# Patient Record
Sex: Male | Born: 1960
Health system: Southern US, Community
[De-identification: ages and names within clinical notes are randomized; demographics above are authoritative.]

## PROBLEM LIST (undated history)

## (undated) ENCOUNTER — Emergency Department (HOSPITAL_COMMUNITY): Admission: EM | Payer: Medicare Other | Source: Home / Self Care

## (undated) ENCOUNTER — Emergency Department (HOSPITAL_BASED_OUTPATIENT_CLINIC_OR_DEPARTMENT_OTHER): Payer: Medicare Other | Source: Home / Self Care

## (undated) DIAGNOSIS — R0602 Shortness of breath: Secondary | ICD-10-CM

## (undated) DIAGNOSIS — Z720 Tobacco use: Secondary | ICD-10-CM

## (undated) DIAGNOSIS — F419 Anxiety disorder, unspecified: Secondary | ICD-10-CM

## (undated) DIAGNOSIS — R413 Other amnesia: Secondary | ICD-10-CM

## (undated) DIAGNOSIS — I480 Paroxysmal atrial fibrillation: Secondary | ICD-10-CM

## (undated) DIAGNOSIS — I10 Essential (primary) hypertension: Secondary | ICD-10-CM

## (undated) DIAGNOSIS — I639 Cerebral infarction, unspecified: Secondary | ICD-10-CM

## (undated) DIAGNOSIS — I509 Heart failure, unspecified: Secondary | ICD-10-CM

## (undated) DIAGNOSIS — I69359 Hemiplegia and hemiparesis following cerebral infarction affecting unspecified side: Secondary | ICD-10-CM

## (undated) DIAGNOSIS — J449 Chronic obstructive pulmonary disease, unspecified: Secondary | ICD-10-CM

## (undated) DIAGNOSIS — I739 Peripheral vascular disease, unspecified: Secondary | ICD-10-CM

## (undated) DIAGNOSIS — T4145XA Adverse effect of unspecified anesthetic, initial encounter: Secondary | ICD-10-CM

## (undated) DIAGNOSIS — I5020 Unspecified systolic (congestive) heart failure: Secondary | ICD-10-CM

## (undated) DIAGNOSIS — E119 Type 2 diabetes mellitus without complications: Secondary | ICD-10-CM

## (undated) DIAGNOSIS — D649 Anemia, unspecified: Secondary | ICD-10-CM

## (undated) DIAGNOSIS — T829XXA Unspecified complication of cardiac and vascular prosthetic device, implant and graft, initial encounter: Secondary | ICD-10-CM

## (undated) DIAGNOSIS — I519 Heart disease, unspecified: Secondary | ICD-10-CM

## (undated) DIAGNOSIS — N289 Disorder of kidney and ureter, unspecified: Secondary | ICD-10-CM

## (undated) DIAGNOSIS — I8500 Esophageal varices without bleeding: Secondary | ICD-10-CM

## (undated) DIAGNOSIS — I340 Nonrheumatic mitral (valve) insufficiency: Secondary | ICD-10-CM

## (undated) DIAGNOSIS — J45909 Unspecified asthma, uncomplicated: Secondary | ICD-10-CM

## (undated) HISTORY — DX: Tobacco use: Z72.0

## (undated) HISTORY — DX: Nonrheumatic mitral (valve) insufficiency: I34.0

## (undated) HISTORY — DX: Heart disease, unspecified: I51.9

## (undated) HISTORY — PX: KIDNEY TRANSPLANT: SHX239

## (undated) HISTORY — PX: COLONOSCOPY W/ BIOPSIES AND POLYPECTOMY: SHX1376

## (undated) HISTORY — PX: CARDIAC CATHETERIZATION: SHX172

## (undated) HISTORY — PX: AV FISTULA PLACEMENT: SHX1204

## (undated) HISTORY — DX: Unspecified systolic (congestive) heart failure: I50.20

---
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
## 1997-08-28 ENCOUNTER — Emergency Department (HOSPITAL_COMMUNITY): Admission: EM | Admit: 1997-08-28 | Discharge: 1997-08-28 | Payer: Self-pay | Admitting: Emergency Medicine

## 1998-07-14 ENCOUNTER — Inpatient Hospital Stay (HOSPITAL_COMMUNITY): Admission: EM | Admit: 1998-07-14 | Discharge: 1998-07-15 | Payer: Self-pay

## 1998-07-14 ENCOUNTER — Encounter: Payer: Self-pay | Admitting: Internal Medicine

## 1998-07-19 ENCOUNTER — Ambulatory Visit (HOSPITAL_COMMUNITY): Admission: RE | Admit: 1998-07-19 | Discharge: 1998-07-19 | Payer: Self-pay | Admitting: *Deleted

## 1998-07-19 ENCOUNTER — Encounter: Payer: Self-pay | Admitting: *Deleted

## 1998-08-18 ENCOUNTER — Emergency Department (HOSPITAL_COMMUNITY): Admission: EM | Admit: 1998-08-18 | Discharge: 1998-08-18 | Payer: Self-pay

## 1999-09-22 ENCOUNTER — Encounter: Payer: Self-pay | Admitting: Emergency Medicine

## 1999-09-22 ENCOUNTER — Inpatient Hospital Stay (HOSPITAL_COMMUNITY): Admission: EM | Admit: 1999-09-22 | Discharge: 1999-09-24 | Payer: Self-pay | Admitting: Emergency Medicine

## 1999-09-23 ENCOUNTER — Encounter: Payer: Self-pay | Admitting: Internal Medicine

## 2000-03-03 ENCOUNTER — Emergency Department (HOSPITAL_COMMUNITY): Admission: EM | Admit: 2000-03-03 | Discharge: 2000-03-03 | Payer: Self-pay | Admitting: Emergency Medicine

## 2000-03-04 ENCOUNTER — Encounter: Payer: Self-pay | Admitting: Emergency Medicine

## 2000-05-20 ENCOUNTER — Encounter: Payer: Self-pay | Admitting: Emergency Medicine

## 2000-05-21 ENCOUNTER — Encounter: Payer: Self-pay | Admitting: Emergency Medicine

## 2000-05-21 ENCOUNTER — Inpatient Hospital Stay (HOSPITAL_COMMUNITY): Admission: EM | Admit: 2000-05-21 | Discharge: 2000-05-23 | Payer: Self-pay | Admitting: Emergency Medicine

## 2001-02-10 ENCOUNTER — Encounter (INDEPENDENT_AMBULATORY_CARE_PROVIDER_SITE_OTHER): Payer: Self-pay | Admitting: Specialist

## 2001-02-10 ENCOUNTER — Encounter: Payer: Self-pay | Admitting: Internal Medicine

## 2001-02-10 ENCOUNTER — Encounter: Payer: Self-pay | Admitting: Emergency Medicine

## 2001-02-10 ENCOUNTER — Inpatient Hospital Stay (HOSPITAL_COMMUNITY): Admission: EM | Admit: 2001-02-10 | Discharge: 2001-02-13 | Payer: Self-pay | Admitting: Emergency Medicine

## 2001-02-11 ENCOUNTER — Encounter: Payer: Self-pay | Admitting: Internal Medicine

## 2001-04-05 ENCOUNTER — Emergency Department (HOSPITAL_COMMUNITY): Admission: EM | Admit: 2001-04-05 | Discharge: 2001-04-05 | Payer: Self-pay | Admitting: Emergency Medicine

## 2001-04-11 ENCOUNTER — Encounter (HOSPITAL_COMMUNITY): Admission: RE | Admit: 2001-04-11 | Discharge: 2001-07-10 | Payer: Self-pay | Admitting: Nephrology

## 2001-06-07 ENCOUNTER — Ambulatory Visit (HOSPITAL_COMMUNITY): Admission: RE | Admit: 2001-06-07 | Discharge: 2001-06-07 | Payer: Self-pay | Admitting: Nephrology

## 2001-06-07 ENCOUNTER — Encounter: Payer: Self-pay | Admitting: Nephrology

## 2001-11-04 ENCOUNTER — Emergency Department (HOSPITAL_COMMUNITY): Admission: EM | Admit: 2001-11-04 | Discharge: 2001-11-04 | Payer: Self-pay | Admitting: Emergency Medicine

## 2001-11-05 ENCOUNTER — Emergency Department (HOSPITAL_COMMUNITY): Admission: EM | Admit: 2001-11-05 | Discharge: 2001-11-06 | Payer: Self-pay | Admitting: Emergency Medicine

## 2002-02-17 ENCOUNTER — Encounter (HOSPITAL_COMMUNITY): Admission: RE | Admit: 2002-02-17 | Discharge: 2002-05-18 | Payer: Self-pay | Admitting: Nephrology

## 2002-05-19 ENCOUNTER — Encounter (HOSPITAL_COMMUNITY): Admission: RE | Admit: 2002-05-19 | Discharge: 2002-08-17 | Payer: Self-pay | Admitting: Nephrology

## 2002-06-13 ENCOUNTER — Encounter: Payer: Self-pay | Admitting: *Deleted

## 2002-06-13 ENCOUNTER — Inpatient Hospital Stay (HOSPITAL_COMMUNITY): Admission: AD | Admit: 2002-06-13 | Discharge: 2002-06-19 | Payer: Self-pay | Admitting: Neurology

## 2002-06-13 ENCOUNTER — Emergency Department (HOSPITAL_COMMUNITY): Admission: EM | Admit: 2002-06-13 | Discharge: 2002-06-13 | Payer: Self-pay | Admitting: *Deleted

## 2002-06-14 ENCOUNTER — Encounter: Payer: Self-pay | Admitting: Neurology

## 2002-06-15 ENCOUNTER — Encounter (INDEPENDENT_AMBULATORY_CARE_PROVIDER_SITE_OTHER): Payer: Self-pay | Admitting: Interventional Cardiology

## 2002-06-19 ENCOUNTER — Inpatient Hospital Stay (HOSPITAL_COMMUNITY)
Admission: RE | Admit: 2002-06-19 | Discharge: 2002-07-03 | Payer: Self-pay | Admitting: Physical Medicine & Rehabilitation

## 2002-07-05 ENCOUNTER — Emergency Department (HOSPITAL_COMMUNITY): Admission: EM | Admit: 2002-07-05 | Discharge: 2002-07-05 | Payer: Self-pay | Admitting: Emergency Medicine

## 2002-08-04 ENCOUNTER — Encounter
Admission: RE | Admit: 2002-08-04 | Discharge: 2002-11-02 | Payer: Self-pay | Admitting: Physical Medicine & Rehabilitation

## 2002-12-17 ENCOUNTER — Encounter (HOSPITAL_COMMUNITY): Admission: RE | Admit: 2002-12-17 | Discharge: 2003-03-17 | Payer: Self-pay | Admitting: Nephrology

## 2003-01-22 ENCOUNTER — Encounter: Admission: RE | Admit: 2003-01-22 | Discharge: 2003-01-22 | Payer: Self-pay | Admitting: Nephrology

## 2003-03-31 DIAGNOSIS — Z91199 Patient's noncompliance with other medical treatment and regimen due to unspecified reason: Secondary | ICD-10-CM | POA: Insufficient documentation

## 2003-03-31 DIAGNOSIS — D509 Iron deficiency anemia, unspecified: Secondary | ICD-10-CM | POA: Insufficient documentation

## 2003-03-31 DIAGNOSIS — N2581 Secondary hyperparathyroidism of renal origin: Secondary | ICD-10-CM | POA: Insufficient documentation

## 2003-03-31 DIAGNOSIS — Z87891 Personal history of nicotine dependence: Secondary | ICD-10-CM | POA: Insufficient documentation

## 2003-05-24 ENCOUNTER — Ambulatory Visit (HOSPITAL_COMMUNITY): Admission: RE | Admit: 2003-05-24 | Discharge: 2003-05-24 | Payer: Self-pay | Admitting: Nephrology

## 2004-02-07 ENCOUNTER — Ambulatory Visit: Payer: Self-pay | Admitting: Internal Medicine

## 2004-02-14 ENCOUNTER — Ambulatory Visit: Payer: Self-pay

## 2004-03-06 ENCOUNTER — Ambulatory Visit: Payer: Self-pay

## 2004-03-20 ENCOUNTER — Encounter: Admission: RE | Admit: 2004-03-20 | Discharge: 2004-06-18 | Payer: Self-pay | Admitting: Nephrology

## 2004-06-19 ENCOUNTER — Encounter: Admission: RE | Admit: 2004-06-19 | Discharge: 2004-09-17 | Payer: Self-pay | Admitting: Nephrology

## 2004-09-18 ENCOUNTER — Encounter: Admission: RE | Admit: 2004-09-18 | Discharge: 2004-12-17 | Payer: Self-pay | Admitting: Nephrology

## 2005-03-11 ENCOUNTER — Observation Stay (HOSPITAL_COMMUNITY): Admission: EM | Admit: 2005-03-11 | Discharge: 2005-03-11 | Payer: Self-pay | Admitting: Emergency Medicine

## 2005-04-29 ENCOUNTER — Emergency Department (HOSPITAL_COMMUNITY): Admission: EM | Admit: 2005-04-29 | Discharge: 2005-04-30 | Payer: Self-pay | Admitting: Emergency Medicine

## 2005-07-02 ENCOUNTER — Emergency Department (HOSPITAL_COMMUNITY): Admission: EM | Admit: 2005-07-02 | Discharge: 2005-07-02 | Payer: Self-pay | Admitting: Emergency Medicine

## 2005-12-04 ENCOUNTER — Emergency Department (HOSPITAL_COMMUNITY): Admission: EM | Admit: 2005-12-04 | Discharge: 2005-12-04 | Payer: Self-pay | Admitting: Emergency Medicine

## 2006-03-21 ENCOUNTER — Emergency Department (HOSPITAL_COMMUNITY): Admission: EM | Admit: 2006-03-21 | Discharge: 2006-03-22 | Payer: Self-pay | Admitting: Emergency Medicine

## 2006-04-13 ENCOUNTER — Emergency Department (HOSPITAL_COMMUNITY): Admission: EM | Admit: 2006-04-13 | Discharge: 2006-04-13 | Payer: Self-pay | Admitting: Emergency Medicine

## 2007-01-13 ENCOUNTER — Ambulatory Visit: Payer: Self-pay | Admitting: Surgery

## 2007-01-22 ENCOUNTER — Ambulatory Visit: Payer: Self-pay | Admitting: Surgery

## 2007-01-22 ENCOUNTER — Ambulatory Visit (HOSPITAL_COMMUNITY): Admission: RE | Admit: 2007-01-22 | Discharge: 2007-01-22 | Payer: Self-pay | Admitting: Surgery

## 2008-02-02 ENCOUNTER — Emergency Department (HOSPITAL_COMMUNITY): Admission: EM | Admit: 2008-02-02 | Discharge: 2008-02-03 | Payer: Self-pay | Admitting: Emergency Medicine

## 2008-08-06 ENCOUNTER — Encounter: Payer: Self-pay | Admitting: Gastroenterology

## 2008-09-25 ENCOUNTER — Emergency Department (HOSPITAL_COMMUNITY): Admission: EM | Admit: 2008-09-25 | Discharge: 2008-09-26 | Payer: Self-pay | Admitting: Emergency Medicine

## 2008-10-04 ENCOUNTER — Ambulatory Visit: Payer: Self-pay | Admitting: Gastroenterology

## 2008-10-04 DIAGNOSIS — I8500 Esophageal varices without bleeding: Secondary | ICD-10-CM | POA: Insufficient documentation

## 2008-10-04 DIAGNOSIS — N186 End stage renal disease: Secondary | ICD-10-CM | POA: Insufficient documentation

## 2008-10-04 DIAGNOSIS — K921 Melena: Secondary | ICD-10-CM | POA: Insufficient documentation

## 2008-10-04 DIAGNOSIS — I10 Essential (primary) hypertension: Secondary | ICD-10-CM

## 2008-10-04 HISTORY — DX: Esophageal varices without bleeding: I85.00

## 2008-10-13 ENCOUNTER — Ambulatory Visit: Payer: Self-pay | Admitting: Gastroenterology

## 2008-10-13 ENCOUNTER — Encounter: Payer: Self-pay | Admitting: Gastroenterology

## 2008-10-15 ENCOUNTER — Encounter: Payer: Self-pay | Admitting: Gastroenterology

## 2008-11-28 DIAGNOSIS — Z940 Kidney transplant status: Secondary | ICD-10-CM | POA: Insufficient documentation

## 2008-12-06 ENCOUNTER — Emergency Department (HOSPITAL_COMMUNITY): Admission: EM | Admit: 2008-12-06 | Discharge: 2008-12-06 | Payer: Self-pay | Admitting: Emergency Medicine

## 2009-12-18 ENCOUNTER — Inpatient Hospital Stay (HOSPITAL_COMMUNITY)
Admission: EM | Admit: 2009-12-18 | Discharge: 2009-12-18 | Disposition: A | Discharge status: Other Acute Inpatient Hospital | Payer: Self-pay | Source: Home / Self Care | Attending: Nephrology | Admitting: Nephrology

## 2010-01-19 ENCOUNTER — Emergency Department (HOSPITAL_BASED_OUTPATIENT_CLINIC_OR_DEPARTMENT_OTHER)
Admission: EM | Admit: 2010-01-19 | Discharge: 2010-01-19 | Payer: Self-pay | Source: Home / Self Care | Admitting: Emergency Medicine

## 2010-01-23 ENCOUNTER — Ambulatory Visit: Admit: 2010-01-23 | Payer: Self-pay | Admitting: Surgery

## 2010-01-23 ENCOUNTER — Ambulatory Visit
Admission: RE | Admit: 2010-01-23 | Discharge: 2010-01-23 | Payer: Self-pay | Source: Home / Self Care | Attending: Surgery | Admitting: Surgery

## 2010-01-31 NOTE — Procedures (Unsigned)
CEPHALIC VEIN MAPPING  INDICATION:  Preoperative vein mapping for AFV placement.  HISTORY: Right arm AVG placed 7 years ago. Left-sided stroke 7 years ago. End-stage renal disease.  EXAM:  The right cephalic vein is compressible with diameter measurements ranging from 0.19 to 0.33 cm.  The right basilic vein is compressible with diameter measurements ranging from 0.68 to 0.82 cm where imaged.  The left cephalic vein is compressible with diameter measurements ranging from 0.15 to 0.26 cm.  The left basilic vein is compressible with diameter measurements ranging from 0.37 to 0.46 cm.  See attached worksheet for all measurements.  IMPRESSION:  Patent right and left cephalic and basilic veins with diameter measurements as described above.  ___________________________________________ V. Leia Alf, MD  EM/MEDQ  D:  01/23/2010  T:  01/23/2010  Job:  [id number]

## 2010-02-10 ENCOUNTER — Ambulatory Visit (HOSPITAL_COMMUNITY)
Admission: RE | Admit: 2010-02-10 | Discharge: 2010-02-10 | Disposition: A | Discharge status: Home / Self Care | Payer: Medicare Other | Source: Home / Self Care | Attending: Surgery | Admitting: Surgery

## 2010-02-10 ENCOUNTER — Ambulatory Visit (HOSPITAL_COMMUNITY): Payer: Medicare Other

## 2010-02-10 DIAGNOSIS — R059 Cough, unspecified: Secondary | ICD-10-CM | POA: Insufficient documentation

## 2010-02-10 DIAGNOSIS — N186 End stage renal disease: Secondary | ICD-10-CM | POA: Insufficient documentation

## 2010-02-10 DIAGNOSIS — R05 Cough: Secondary | ICD-10-CM | POA: Insufficient documentation

## 2010-02-10 DIAGNOSIS — Z8673 Personal history of transient ischemic attack (TIA), and cerebral infarction without residual deficits: Secondary | ICD-10-CM | POA: Insufficient documentation

## 2010-02-10 DIAGNOSIS — I120 Hypertensive chronic kidney disease with stage 5 chronic kidney disease or end stage renal disease: Secondary | ICD-10-CM | POA: Insufficient documentation

## 2010-02-10 DIAGNOSIS — Z87891 Personal history of nicotine dependence: Secondary | ICD-10-CM | POA: Insufficient documentation

## 2010-02-10 DIAGNOSIS — J90 Pleural effusion, not elsewhere classified: Secondary | ICD-10-CM | POA: Insufficient documentation

## 2010-02-10 LAB — SURGICAL PCR SCREEN: MRSA, PCR: NEGATIVE

## 2010-02-10 LAB — GLUCOSE, CAPILLARY: Glucose-Capillary: 94 mg/dL (ref 70–99)

## 2010-02-10 LAB — POCT I-STAT 4, (NA,K, GLUC, HGB,HCT)
Glucose, Bld: 108 mg/dL — ABNORMAL HIGH (ref 70–99)
HCT: 35 % — ABNORMAL LOW (ref 39.0–52.0)
Hemoglobin: 11.9 g/dL — ABNORMAL LOW (ref 13.0–17.0)
Potassium: 3.7 mEq/L (ref 3.5–5.1)

## 2010-02-11 ENCOUNTER — Inpatient Hospital Stay (HOSPITAL_COMMUNITY)
Admission: EM | Admit: 2010-02-11 | Discharge: 2010-02-16 | DRG: 193 | Discharge status: Against Medical Advice | Payer: Medicare Other | Attending: Internal Medicine | Admitting: Internal Medicine

## 2010-02-11 ENCOUNTER — Emergency Department (HOSPITAL_COMMUNITY): Payer: Medicare Other

## 2010-02-11 DIAGNOSIS — I120 Hypertensive chronic kidney disease with stage 5 chronic kidney disease or end stage renal disease: Secondary | ICD-10-CM | POA: Diagnosis present

## 2010-02-11 DIAGNOSIS — N19 Unspecified kidney failure: Secondary | ICD-10-CM

## 2010-02-11 DIAGNOSIS — Z79899 Other long term (current) drug therapy: Secondary | ICD-10-CM

## 2010-02-11 DIAGNOSIS — J90 Pleural effusion, not elsewhere classified: Secondary | ICD-10-CM | POA: Diagnosis present

## 2010-02-11 DIAGNOSIS — I69998 Other sequelae following unspecified cerebrovascular disease: Secondary | ICD-10-CM

## 2010-02-11 DIAGNOSIS — N2581 Secondary hyperparathyroidism of renal origin: Secondary | ICD-10-CM | POA: Diagnosis present

## 2010-02-11 DIAGNOSIS — F17200 Nicotine dependence, unspecified, uncomplicated: Secondary | ICD-10-CM | POA: Diagnosis present

## 2010-02-11 DIAGNOSIS — R29898 Other symptoms and signs involving the musculoskeletal system: Secondary | ICD-10-CM | POA: Diagnosis present

## 2010-02-11 DIAGNOSIS — IMO0002 Reserved for concepts with insufficient information to code with codable children: Secondary | ICD-10-CM

## 2010-02-11 DIAGNOSIS — Y830 Surgical operation with transplant of whole organ as the cause of abnormal reaction of the patient, or of later complication, without mention of misadventure at the time of the procedure: Secondary | ICD-10-CM | POA: Diagnosis present

## 2010-02-11 DIAGNOSIS — E8779 Other fluid overload: Secondary | ICD-10-CM | POA: Diagnosis not present

## 2010-02-11 DIAGNOSIS — F329 Major depressive disorder, single episode, unspecified: Secondary | ICD-10-CM | POA: Diagnosis present

## 2010-02-11 DIAGNOSIS — J189 Pneumonia, unspecified organism: Principal | ICD-10-CM | POA: Diagnosis present

## 2010-02-11 DIAGNOSIS — Q619 Cystic kidney disease, unspecified: Secondary | ICD-10-CM

## 2010-02-11 DIAGNOSIS — Z992 Dependence on renal dialysis: Secondary | ICD-10-CM

## 2010-02-11 DIAGNOSIS — N2889 Other specified disorders of kidney and ureter: Secondary | ICD-10-CM | POA: Diagnosis present

## 2010-02-11 DIAGNOSIS — R310 Gross hematuria: Secondary | ICD-10-CM | POA: Diagnosis present

## 2010-02-11 DIAGNOSIS — D631 Anemia in chronic kidney disease: Secondary | ICD-10-CM | POA: Diagnosis present

## 2010-02-11 DIAGNOSIS — R05 Cough: Secondary | ICD-10-CM | POA: Diagnosis present

## 2010-02-11 DIAGNOSIS — N186 End stage renal disease: Secondary | ICD-10-CM | POA: Diagnosis present

## 2010-02-11 DIAGNOSIS — R059 Cough, unspecified: Secondary | ICD-10-CM | POA: Diagnosis present

## 2010-02-11 DIAGNOSIS — Z8673 Personal history of transient ischemic attack (TIA), and cerebral infarction without residual deficits: Secondary | ICD-10-CM

## 2010-02-11 DIAGNOSIS — T8610 Unspecified complication of kidney transplant: Secondary | ICD-10-CM | POA: Diagnosis present

## 2010-02-11 DIAGNOSIS — F3289 Other specified depressive episodes: Secondary | ICD-10-CM | POA: Diagnosis present

## 2010-02-11 HISTORY — DX: Disorder of kidney and ureter, unspecified: N28.9

## 2010-02-11 HISTORY — DX: Essential (primary) hypertension: I10

## 2010-02-11 LAB — CBC
MCHC: 30.4 g/dL (ref 30.0–36.0)
MCV: 92.5 fL (ref 78.0–100.0)
Platelets: 210 10*3/uL (ref 150–400)
RDW: 17.9 % — ABNORMAL HIGH (ref 11.5–15.5)

## 2010-02-11 LAB — POCT I-STAT, CHEM 8
BUN: 19 mg/dL (ref 6–23)
Calcium, Ion: 1.01 mmol/L — ABNORMAL LOW (ref 1.12–1.32)
Chloride: 97 meq/L (ref 96–112)
Creatinine, Ser: 6.2 mg/dL — ABNORMAL HIGH (ref 0.4–1.5)
Glucose, Bld: 116 mg/dL — ABNORMAL HIGH (ref 70–99)
HCT: 32 % — ABNORMAL LOW (ref 39.0–52.0)
Hemoglobin: 10.9 g/dL — ABNORMAL LOW (ref 13.0–17.0)
Potassium: 3.7 meq/L (ref 3.5–5.1)
Sodium: 137 meq/L (ref 135–145)
TCO2: 32 mmol/L (ref 0–100)

## 2010-02-11 LAB — DIFFERENTIAL
Basophils Relative: 0 % (ref 0–1)
Lymphs Abs: 0.7 10*3/uL (ref 0.7–4.0)
Monocytes Absolute: 1.1 10*3/uL — ABNORMAL HIGH (ref 0.1–1.0)
Monocytes Relative: 10 % (ref 3–12)
Neutro Abs: 9.3 10*3/uL — ABNORMAL HIGH (ref 1.7–7.7)
Neutrophils Relative %: 84 % — ABNORMAL HIGH (ref 43–77)

## 2010-02-11 LAB — LACTIC ACID, PLASMA: Lactic Acid, Venous: 1.5 mmol/L (ref 0.5–2.2)

## 2010-02-12 ENCOUNTER — Inpatient Hospital Stay (HOSPITAL_COMMUNITY): Payer: Medicare Other

## 2010-02-12 LAB — CK TOTAL AND CKMB (NOT AT ARMC)
CK, MB: 1.3 ng/mL (ref 0.3–4.0)
Relative Index: 0.8 (ref 0.0–2.5)
Total CK: 168 U/L (ref 7–232)

## 2010-02-12 LAB — CBC
HCT: 27.7 % — ABNORMAL LOW (ref 39.0–52.0)
Platelets: 183 10*3/uL (ref 150–400)
RBC: 3.02 MIL/uL — ABNORMAL LOW (ref 4.22–5.81)
RDW: 17.9 % — ABNORMAL HIGH (ref 11.5–15.5)

## 2010-02-12 LAB — BASIC METABOLIC PANEL
BUN: 21 mg/dL (ref 6–23)
CO2: 29 mEq/L (ref 19–32)
Calcium: 8.5 mg/dL (ref 8.4–10.5)
Chloride: 96 mEq/L (ref 96–112)
GFR calc Af Amer: 11 mL/min — ABNORMAL LOW (ref >60)
GFR calc non Af Amer: 9 mL/min — ABNORMAL LOW (ref >60)
Sodium: 140 mEq/L (ref 135–145)

## 2010-02-12 LAB — CARDIAC PANEL(CRET KIN+CKTOT+MB+TROPI)
CK, MB: 1.4 ng/mL (ref 0.3–4.0)
CK, MB: 1.5 ng/mL (ref 0.3–4.0)
Total CK: 135 U/L (ref 7–232)
Troponin I: 0.08 ng/mL — ABNORMAL HIGH (ref 0.00–0.06)
Troponin I: 0.09 ng/mL — ABNORMAL HIGH (ref 0.00–0.06)

## 2010-02-12 LAB — TROPONIN I: Troponin I: 0.1 ng/mL — ABNORMAL HIGH (ref 0.00–0.06)

## 2010-02-12 LAB — PROLACTIN: Prolactin: 35.8 ng/mL — ABNORMAL HIGH (ref 2.1–17.1)

## 2010-02-12 LAB — BRAIN NATRIURETIC PEPTIDE: Pro B Natriuretic peptide (BNP): 1587 pg/mL — ABNORMAL HIGH (ref 0.0–100.0)

## 2010-02-13 ENCOUNTER — Inpatient Hospital Stay (HOSPITAL_COMMUNITY): Payer: Medicare Other

## 2010-02-13 ENCOUNTER — Encounter (HOSPITAL_COMMUNITY): Payer: Self-pay

## 2010-02-13 LAB — DIFFERENTIAL
Eosinophils Absolute: 0 10*3/uL (ref 0.0–0.7)
Lymphocytes Relative: 4 % — ABNORMAL LOW (ref 12–46)
Lymphs Abs: 0.5 10*3/uL — ABNORMAL LOW (ref 0.7–4.0)
Monocytes Relative: 5 % (ref 3–12)
Neutrophils Relative %: 91 % — ABNORMAL HIGH (ref 43–77)

## 2010-02-13 LAB — COMPREHENSIVE METABOLIC PANEL
Alkaline Phosphatase: 67 U/L (ref 39–117)
BUN: 51 mg/dL — ABNORMAL HIGH (ref 6–23)
CO2: 24 mEq/L (ref 19–32)
Chloride: 98 mEq/L (ref 96–112)
Creatinine, Ser: 9.92 mg/dL — ABNORMAL HIGH (ref 0.4–1.5)
GFR calc non Af Amer: 6 mL/min — ABNORMAL LOW (ref >60)
Glucose, Bld: 168 mg/dL — ABNORMAL HIGH (ref 70–99)
Potassium: 4.5 mEq/L (ref 3.5–5.1)
Total Bilirubin: 0.3 mg/dL (ref 0.3–1.2)

## 2010-02-13 LAB — URINALYSIS, MICROSCOPIC ONLY
Nitrite: NEGATIVE
Protein, ur: >300 mg/dL — AB
Specific Gravity, Urine: 1.013 (ref 1.005–1.030)
Urobilinogen, UA: 0.2 mg/dL (ref 0.0–1.0)

## 2010-02-13 LAB — CBC
HCT: 26 % — ABNORMAL LOW (ref 39.0–52.0)
Hemoglobin: 7.9 g/dL — ABNORMAL LOW (ref 13.0–17.0)
MCH: 27.5 pg (ref 26.0–34.0)
MCV: 90.6 fL (ref 78.0–100.0)
Platelets: 168 10*3/uL (ref 150–400)
RBC: 2.87 MIL/uL — ABNORMAL LOW (ref 4.22–5.81)

## 2010-02-13 MED ORDER — IOHEXOL 300 MG/ML  SOLN
100.0000 mL | Freq: Once | INTRAMUSCULAR | Status: AC | PRN
  Administered 2010-02-13: 100 mL via INTRAVENOUS

## 2010-02-14 ENCOUNTER — Inpatient Hospital Stay (HOSPITAL_COMMUNITY): Payer: Medicare Other

## 2010-02-14 LAB — CBC
Platelets: 225 10*3/uL (ref 150–400)
RBC: 2.62 MIL/uL — ABNORMAL LOW (ref 4.22–5.81)
RDW: 17.1 % — ABNORMAL HIGH (ref 11.5–15.5)
WBC: 11.7 10*3/uL — ABNORMAL HIGH (ref 4.0–10.5)

## 2010-02-14 LAB — URINE CULTURE
Colony Count: NO GROWTH
Culture  Setup Time: 201202061450
Culture: NO GROWTH

## 2010-02-14 LAB — RENAL FUNCTION PANEL
Albumin: 2.3 g/dL — ABNORMAL LOW (ref 3.5–5.2)
BUN: 82 mg/dL — ABNORMAL HIGH (ref 6–23)
Chloride: 98 mEq/L (ref 96–112)
GFR calc Af Amer: 5 mL/min — ABNORMAL LOW (ref >60)
GFR calc non Af Amer: 4 mL/min — ABNORMAL LOW (ref >60)
Phosphorus: 5.9 mg/dL — ABNORMAL HIGH (ref 2.3–4.6)
Potassium: 4.8 mEq/L (ref 3.5–5.1)
Sodium: 138 mEq/L (ref 135–145)

## 2010-02-15 ENCOUNTER — Inpatient Hospital Stay (HOSPITAL_COMMUNITY): Payer: Medicare Other

## 2010-02-15 LAB — CROSSMATCH
ABO/RH(D): A POS
Antibody Screen: NEGATIVE
Unit division: 0

## 2010-02-15 LAB — COMPREHENSIVE METABOLIC PANEL
ALT: <8 U/L (ref 0–53)
AST: 11 U/L (ref 0–37)
Albumin: 2.5 g/dL — ABNORMAL LOW (ref 3.5–5.2)
CO2: 23 mEq/L (ref 19–32)
Calcium: 7.1 mg/dL — ABNORMAL LOW (ref 8.4–10.5)
Chloride: 100 mEq/L (ref 96–112)
GFR calc Af Amer: 9 mL/min — ABNORMAL LOW (ref >60)
GFR calc non Af Amer: 8 mL/min — ABNORMAL LOW (ref >60)
Sodium: 140 mEq/L (ref 135–145)

## 2010-02-15 LAB — CBC
HCT: 29.1 % — ABNORMAL LOW (ref 39.0–52.0)
Hemoglobin: 9.4 g/dL — ABNORMAL LOW (ref 13.0–17.0)
Hemoglobin: 9.4 g/dL — ABNORMAL LOW (ref 13.0–17.0)
MCH: 28.4 pg (ref 26.0–34.0)
MCHC: 31.4 g/dL (ref 30.0–36.0)
MCHC: 32.3 g/dL (ref 30.0–36.0)
MCV: 87.9 fL (ref 78.0–100.0)
Platelets: 216 10*3/uL (ref 150–400)
RBC: 3.39 MIL/uL — ABNORMAL LOW (ref 4.22–5.81)
RDW: 16.6 % — ABNORMAL HIGH (ref 11.5–15.5)

## 2010-02-15 LAB — DIFFERENTIAL
Basophils Absolute: 0 10*3/uL (ref 0.0–0.1)
Basophils Relative: 0 % (ref 0–1)
Eosinophils Absolute: 0 10*3/uL (ref 0.0–0.7)
Neutro Abs: 7.5 10*3/uL (ref 1.7–7.7)
Neutrophils Relative %: 83 % — ABNORMAL HIGH (ref 43–77)

## 2010-02-16 ENCOUNTER — Inpatient Hospital Stay (HOSPITAL_COMMUNITY): Payer: Medicare Other

## 2010-02-16 ENCOUNTER — Other Ambulatory Visit: Payer: Self-pay | Admitting: Interventional Radiology

## 2010-02-16 LAB — BASIC METABOLIC PANEL
BUN: 40 mg/dL — ABNORMAL HIGH (ref 6–23)
CO2: 27 mEq/L (ref 19–32)
Calcium: 7.9 mg/dL — ABNORMAL LOW (ref 8.4–10.5)
Creatinine, Ser: 5.63 mg/dL — ABNORMAL HIGH (ref 0.4–1.5)
GFR calc non Af Amer: 11 mL/min — ABNORMAL LOW (ref >60)
Glucose, Bld: 104 mg/dL — ABNORMAL HIGH (ref 70–99)
Sodium: 140 mEq/L (ref 135–145)

## 2010-02-16 LAB — BODY FLUID CELL COUNT WITH DIFFERENTIAL
Lymphs, Fluid: 70 %
Monocyte-Macrophage-Serous Fluid: 9 % — ABNORMAL LOW (ref 50–90)
Neutrophil Count, Fluid: 21 % (ref 0–25)

## 2010-02-16 LAB — LACTATE DEHYDROGENASE, PLEURAL OR PERITONEAL FLUID: LD, Fluid: 242 U/L — ABNORMAL HIGH (ref 3–23)

## 2010-02-16 LAB — AMYLASE, BODY FLUID: Amylase, Fluid: 33 U/L

## 2010-02-16 LAB — GLUCOSE, SEROUS FLUID: Glucose, Fluid: 140 mg/dL

## 2010-02-16 LAB — PROTEIN, BODY FLUID

## 2010-02-17 LAB — PH, BODY FLUID: pH, Fluid: 8

## 2010-02-17 LAB — PATHOLOGIST SMEAR REVIEW

## 2010-02-18 LAB — CULTURE, BLOOD (ROUTINE X 2)
Culture  Setup Time: 201202050047
Culture  Setup Time: 201202051118
Culture: NO GROWTH
Culture: NO GROWTH

## 2010-02-18 NOTE — Consult Note (Signed)
NAME:  Nathaniel White, Nathaniel White               ACCOUNT NO.:  [id number]  MEDICAL RECORD NO.:  [id number]           PATIENT TYPE:  I  LOCATION:  2501                         FACILITY:  [facility]  PHYSICIAN:  Darrold Span. Florene Glen, M.D.  DATE OF BIRTH:  [birth year]  DATE OF CONSULTATION: DATE OF DISCHARGE:                                CONSULTATION   NEPHROLOGY CONSULTATION  I was asked by Dr. Raelyn Number to see this 50 year old male with end-stage renal disease, status post cadaveric renal transplant in November 2010 at [facility].  The patient presented to [facility] in December 2011 with nausea and vomiting.  Serum creatinine was 18.3 mg/dL and demonstrating acute kidney failure, and the patient was transferred to [facility].  At that hospital, he apparently underwent biopsy, was told he had evidence of rejection, and was started on dialysis with a hope that his renal function would improve, apparently no improvement took place, and last week, his immunosuppressant medications were tapered.  Around that time subsequent to that dosage of reduction, he noted right-sided abdominal pain and had evidence of gross hematuria yesterday.  In addition, the patient had a left arm A-V graft placed on February 10, 2010.  Complained of some cough.  Chest x-ray showed right lower lobe infiltrate and small pleural effusion.  He was treated with Avelox.  He subsequently continued to not feel so well and today was noted to have a temperature as high as 104 degrees and was referred to the emergency room for further evaluation. He has been complaining of some right-sided abdominal pain and arthralgias.  PAST MEDICAL HISTORY:  Hypertension; depression; gallstone; history of stroke, left body; status post renal transplant (cadaveric); status post left upper extremity A-V Gore-Tex graft on February 10, 2010.  SOCIAL HISTORY:  He lives alone.  He smokes cigarettes  occasionally. Does not consume alcoholic beverages.  CURRENT MEDICATIONS: 1. Prograf 3 mg b.i.d., was 7 mg b.i.d. 2. CellCept 750 mg b.i.d., was 1 g b.i.d. 3. Prednisone 5 mg daily, it was 10 mg per day. 4. Other medications include multivitamins over the counter. 5. Labetalol 300 mg twice daily. 6. Levofloxacin 500 mg daily.  REVIEW OF SYSTEMS:  As noted above.  PHYSICAL EXAMINATION:  VITAL SIGNS:  Blood pressure 144/79, temperature currently 98.5 degrees. GENERAL:  Alert and oriented, cooperative. HEENT:  Atraumatic, normocephalic.  Extraocular movements intact. LUNGS:  Decreased breath sounds on the right side with wheezing on the upper right lung field.  PermCath, right chest. HEART:  Regular rhythm and rate.  No pericardial friction rub. ABDOMEN:  Very tender renal allograft, right lower quadrant.  Abdomen is obese, otherwise, nontender. EXTREMITIES:  With left upper extremity A-V Gore-Tex graft with a positive thrill and recent postop changes and swelling of the arm. NEUROLOGIC:  He has some weakness of the left side.  LABORATORY DATA:  Sodium 140, potassium 4.0, chloride 96, CO2 of 29, BUN 21, creatinine 6.85.  Hemoglobin 8.4, platelets 183,000, white blood count 10,800.  ASSESSMENT: 1. End-stage renal disease. 2. Status post renal transplant. 3. Gross hematuria, probably secondary to acute rejection. 4.  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 5. Status post left arm AVG. 6. Right pleural effusion of uncertain etiology.  PLAN: 1. We will check urinalysis and C and S. 2. I will begin IV Solu-Medrol. 3. Continue immunosuppressant drugs.  Increase dosage somewhat. 4. I discussed with the patient the possibility of a transplant     nephrectomy which he would prefer to avoid.  He prefers medical     therapy as an option. 5. Consider thoracentesis to rule out empyema.           ______________________________ Darrold Span Florene Glen, M.D.     ACP/MEDQ  D:  02/12/2010  T:  02/13/2010  Job:  [id number]  Electronically Signed by Erling Cruz M.D. on 02/13/2010 07:58:43 AM

## 2010-02-18 NOTE — H&P (Signed)
NAME:  Nathaniel White, Nathaniel White               ACCOUNT NO.:  [id number]  MEDICAL RECORD NO.:  [id number]           PATIENT TYPE:  E  LOCATION:  [facility]                         FACILITY:  [place]  PHYSICIAN:  Lyn Records, MD      DATE OF BIRTH:  1960/02/11  DATE OF ADMISSION:  02/11/2010                              HISTORY & PHYSICAL   PRIMARY CARE PHYSICIAN:  Dr. Jimmy Footman.  CHIEF COMPLAINT:  Shortness of breath and fever.  HISTORY OF PRESENT ILLNESS:  The patient is a 50 year old man with a history of hypertension, end-stage renal disease, status post failed renal transplant, CVA, and depression who presents with shortness of breath and fever.  The patient reports that yesterday he underwent an outpatient procedure for left AV fistula and after that procedure, he was short of breath on his way home, was just feeling weak and sick.  He was short of breath when he was walking around.  He also reports right-sided upper abdominal/lower chest pain when taking in a deep breath.  Today, he went to his regular scheduled dialysis, and he was noted to have a temperature of 104 there. He also reports an occasional productive cough.  No nausea, vomiting, or diarrhea.  No other specific complaints.  His last dialysis session was earlier today and he completed a full session.  REVIEW OF SYSTEMS:  Review of ten organ systems was done and is negative except as stated above.  ALLERGIES:  CODEINE.  MEDICATIONS:  The patient does not know the medications he is taking right now.  He only can remember that he is taking labetalol 300 twice a day; CellCept, unknown dose; Prograf, unknown dose; prednisone, unknown dose.  PAST MEDICAL HISTORY:  The patient carries a diagnosis of heart failure in the chart with a last echo in 2004 showing an EF of 30-40%; however, the patient states he has been told that his heart was okay recently, hypertension, depression, end-stage renal disease, on dialysis after a failed  renal transplant in 2010, CVA with residual left-sided weakness, and gallstones.  SOCIAL HISTORY:  The patient is a half pack per day smoker for the past 20 years.  He reports he quit 1 week ago.  Denies alcohol use.  FAMILY HISTORY:  Mother has a "weak heart."  PHYSICAL EXAMINATION:  VITAL SIGNS:  Blood pressure 182/98, heart rate 123, respiratory rate 20, temperature 100.7. GENERAL:  In no acute distress. HEENT:  Mucous membranes are moist and sclerae anicteric. CV:  Tachycardic.  No murmurs. LUNGS:  The patient is unable to sit up for a full lung exam, however, anteriorly and from what I can hear from the sides, the patient has decreased breath sounds in his right lower lung fields, otherwise clear.  No wheezes. Nonlabored breathing. ABDOMEN:  Soft, nontender, and nondistended. EXTREMITIES:  No lower extremity edema. NEURO:  Left-sided weakness from prior stroke. SKIN:  Site of AV fistula L arm, staples show no drainage, however, there is erythema and warmth over the incision and over the skin.  LABORATORY DATA:  White count 11.1, hemoglobin 9.4, platelets 210. Lactic acid 1.5.  Sodium 137, potassium  3.7, chloride 97, bicarb 32, BUN 80, creatinine 6.2, glucose 116.  EKG shows sinus tachycardia, rate 109, LVH with repolarization changes, left atrial abnormality.  ASSESSMENT AND PLAN:  The patient is a 50 year old man with a history of hypertension, cerebrovascular accident, end-stage renal disease, status post failed cadaveric renal transplant, currently on HD who presents with shortness of breath and fever. 1. Shortness of breath is likely due to the patient's right-sided     effusion, appears to be bigger even compared to the x-ray from     yesterday that the patient got preoperatively.  It is unclear what the cause of this effuson is at this time.  It may have increased in size due to fluids given during his surgey yesterday.  The patient reports he has never had sample     of  the fluid taken.  At this time, we will order an echocardiogram     to evaluate the patient for heart failure as cause of the right-     sided effusion.  He does not make much urine, and at this time, he     is unable to produce any, and he has not responded to diuresis in     the past per his report.  The patient will need fluid management     with hemodialysis.  Renal has been consulted in the emergency room     and they will see him in the morning.  If the effusion does not     resolve with taking extra fluid off during dialysis, may consider an IR tap of the fluid,     especially given that the patient is symptomatic.  His last     dialysis session was today and the effusion is still there;     Symptoms and effusion unlikely to resolve with hemodialysis.  We will monitor the     patient on telemetry and trend enzymes, although his EKG at this     time is showing sinus tachycardia with no acute ischemic changes.     We will rule out cardiac ischemia.  At this time, the patient is     satting well on room air.  Check BNP. 2. Fever.  The differential diagnosis for this includes a pulmonary     process that may be covered up by the pleural effusion but the more     likely source is an infection from the left AV fistula incision     given the patient's arm redness.  At this time, I will put the     patient on vancomycin and Zosyn given his immunosuppressed status.     We will discuss the patient with Vascular, Dr. Trula Slade in the     morning.  Blood cultures were obtained by the emergency department     from his peripheral site and also from the dialysis catheter.  Check UA and cx if patient produces urine 3. Right sided pleuritic abdominal/chest pain.  This is most likely irritation of diaphragm from plueral effusion.  No EKG changes to suggest ischemia.  Will monitor on tele and trend cardiac enzymes as above. 4. End-stage renal disease, status post failed cadaveric transplant,     on  hemodialysis on Tuesday, Thursday, and Saturday.  We will     discuss with Renal about the extra dialysis session given the     patient's right effusion. 5. Questionable history of congestive heart failure.  We will check an     echocardiogram.  6. Hypertension.  At this time, the patient is unable to provide the     list of his medications, although he does remember he is on     labetalol 300 mg twice a day.  We will start that and patient will     likely need additional medications. 7. Cerebrovascular accident, complicated by residual left-sided     weakness, stable. 8. Medications.  At this time, the patient is unable to provide a     medication list.  He gave the phone number of someone to call for     this.  Her name is Mirna Mires at [phone].  I called that     number twice, still I was unable to get in to contact with anyone. There is an error message for the mailbox to that     number.  Also called Nephrology on-call this evening to have an     idea what the patient's medications were at this time.  They did     not have access to any medications that the patient was on;     however, they did state to just put him on a prednisone 10 mg, we     will do that.  Will need to try and obtain more information on pt's medications in am 9. Fluids, electrolytes, nutrition.  Hep-Lock IV.  replete electrolytes with dialysis.  Renal and Heart-Healthy diet. 10. Prophylaxis.  Heparin t.i.d.   DISPOSITION:  Full code.            ______________________________ Lyn Records, MD     JF/MEDQ  D:  02/12/2010  T:  02/12/2010  Job:  [id number]  Electronically Signed by Lyn Records MD on 02/12/2010 04:13:02 AM

## 2010-02-19 NOTE — Op Note (Addendum)
NAME:  Nathaniel White, Nathaniel White               ACCOUNT NO.:  [id number]  MEDICAL RECORD NO.:  [id number]           PATIENT TYPE:  O  LOCATION:  [facility]                         FACILITY:  [place]  PHYSICIAN:  Theotis Burrow IV, MDDATE OF BIRTH:  1960/04/28  DATE OF PROCEDURE:  02/10/2010 DATE OF DISCHARGE:  02/10/2010                              OPERATIVE REPORT   PREOPERATIVE DIAGNOSIS:  End-stage renal disease.  POSTOPERATIVE DIAGNOSIS:  End-stage renal disease.  PROCEDURE PERFORMED:  Left basilic vein transposition.  SURGEON.: 1. Leia Alf, MD  ASSISTANT:  Leta Baptist, PA  ANESTHESIA:  General.  FINDINGS:  Excellent 4-5 mm vein.  INDICATIONS:  This is a 50 year old gentleman with end-stage renal disease.  He has had a stroke and has contractions in the left arm, but his left basilic vein was adequate.  We have discussed proceeding with basilic vein transposition.  PROCEDURE:  The patient was identified in the holding area and taken to room six placed supine on the table.  LMA anesthesia was initiated, mid way through the case this was converted to general anesthesia secondary to a desaturation.  Ultrasound was used to map course of basilic vein in the upper arm.  A longitudinal incision was made just above the antecubital crease in between the basilic vein and the palpable brachial pulse of the brachial artery.  I initially exposed the brachial artery through this incision. The artery was minimally diseased and approximately 3 to 3.5 mm, it was encircled proximally and distally with vessel loops.  Next, through the same incision the basilic vein was identified and circumferentially dissected free.  It was of excellent caliber of about 4 mm.  There were multiple branches at the antecubital crease, which required ligation with 3-0 silk ties.  Just below the antecubital crease, the vein appeared decrease in size, therefore I felt the extended portion of the vein should  be used.  Through two additional skip incisions throughout the upper arm, the remaining basilic vein was harvested.  Nerve was visualized and protected throughout the case.  Side branches were ligated between 3-0 silk ties.  The basilic vein was dissection up into the axilla where it was identified diving into deep system.  At this point, a right angle was placed on the distal vein and the vein was transected, the distal end was ligated with 2-0 silk ties.  Next, the vein was distended with heparinized saline.  It was marked for proper orientation.  It distended nicely to greater than 5 mm.  A curved tunneler was then used to create a tunnel in the upper arm and I tried to tunnel this laterally as possible so that he would not have issues due to his contracture.  The vein was then brought through the tunnel making sure to maintain proper orientation.  At this point in time, the patient was given systemic heparinization.  After the heparin circulated, the artery was occluded with serrefine clamps.  A #11 blade was used to make an arteriotomy, which was extended longitudinally with Potts scissors.  The vein was cut to the appropriate length and spatulated to fit the  size of the arteriotomy.  Running anastomosis was then created with 6-0 Prolene.  Prior to completion, appropriate flush maneuvers were performed, the anastomosis was then completed.  There was an excellent thrill within the fistula.  There was multiphasic Doppler signal in the radial and ulnar artery at the wrist.  These did augment with compression of fistula.  At this point, I was satisfied with our procedure.  The wounds were irrigated.  Deep tissues were closed with 3- 0 Vicryl.  The skin was closed with 3-0 Vicryl.  Dermabond was placed in the wounds.     [name], MD     VWB/MEDQ  D:  02/10/2010  T:  02/11/2010  Job:  [id number]  Electronically Signed by Orvan Falconer IV MD on 02/19/2010 09:59:53 PM

## 2010-02-20 LAB — BODY FLUID CULTURE

## 2010-03-13 ENCOUNTER — Ambulatory Visit: Payer: 59 | Admitting: Surgery

## 2010-03-20 LAB — URINALYSIS, ROUTINE W REFLEX MICROSCOPIC
Ketones, ur: NEGATIVE mg/dL
Leukocytes, UA: NEGATIVE
Nitrite: NEGATIVE
Protein, ur: 100 mg/dL — AB

## 2010-03-20 LAB — DIFFERENTIAL
Basophils Absolute: 0 10*3/uL (ref 0.0–0.1)
Basophils Relative: 0 % (ref 0–1)
Eosinophils Absolute: 0.2 10*3/uL (ref 0.0–0.7)
Eosinophils Relative: 2 % (ref 0–5)
Monocytes Absolute: 0.8 10*3/uL (ref 0.1–1.0)
Monocytes Relative: 7 % (ref 3–12)

## 2010-03-20 LAB — CBC
HCT: 35.7 % — ABNORMAL LOW (ref 39.0–52.0)
MCV: 80.4 fL (ref 78.0–100.0)
Platelets: 150 10*3/uL (ref 150–400)
RBC: 4.44 MIL/uL (ref 4.22–5.81)
RDW: 13.8 % (ref 11.5–15.5)
WBC: 12 10*3/uL — ABNORMAL HIGH (ref 4.0–10.5)

## 2010-03-20 LAB — COMPREHENSIVE METABOLIC PANEL
ALT: 9 U/L (ref 0–53)
Albumin: 3.3 g/dL — ABNORMAL LOW (ref 3.5–5.2)
Alkaline Phosphatase: 107 U/L (ref 39–117)
BUN: 154 mg/dL — ABNORMAL HIGH (ref 6–23)
Chloride: 106 mEq/L (ref 96–112)
Glucose, Bld: 97 mg/dL (ref 70–99)
Potassium: 4.5 mEq/L (ref 3.5–5.1)
Sodium: 135 mEq/L (ref 135–145)
Total Bilirubin: 0.6 mg/dL (ref 0.3–1.2)
Total Protein: 8.4 g/dL — ABNORMAL HIGH (ref 6.0–8.3)

## 2010-03-20 LAB — CK TOTAL AND CKMB (NOT AT ARMC)
CK, MB: 10.8 ng/mL (ref 0.3–4.0)
Relative Index: 2.1 (ref 0.0–2.5)
Total CK: 515 U/L — ABNORMAL HIGH (ref 7–232)

## 2010-03-20 LAB — POCT CARDIAC MARKERS
CKMB, poc: 15.4 ng/mL (ref 1.0–8.0)
Troponin i, poc: <0.05 ng/mL (ref 0.00–0.09)

## 2010-04-06 NOTE — Discharge Summary (Signed)
NAME:  Nathaniel, White            ACCOUNT NO.:  [id number]  MEDICAL RECORD NO.:  [id number]           PATIENT TYPE:  I  LOCATION:  [id number]                         FACILITY:  [place]  PHYSICIAN:  Sheila Oats, M.D.DATE OF BIRTH:  1960/10/29  DATE OF ADMISSION:  02/11/2010 DATE OF DISCHARGE:  02/16/2010                        DISCHARGE SUMMARY - REFERRING   DISCHARGE DIAGNOSES: 1. Community-acquired pneumonia with large right pleural effusion --     status post thoracentesis on February 16, 2010 with 210 mL of fluid     removed, studies on fluid pending at the time of the patient     leaving AMA. 2. Right perinephric hemorrhage. 3. Bilateral nodular densities in kidneys with a left hemorrhagic cyst     -- the patient to have follow up MRI in 6 months. 4. Hypertension. 5. End-stage renal disease. 6. Volume overload. 7. History of systolic heart failure -- with last ejection fraction     30%-40% per echo in 2004. 8. History of depression. 9. History of failed renal transplant in 2010. 10.History of cerebrovascular accident with residual left-sided     weakness. 11.History of gallstones. 12.The patient left AMA from the hospital. 13.Gross hematuria.  PROCEDURES AND STUDIES: 1. Chest x-ray on February 11, 2010 -- large right pleural effusion     similar to previous exam.  Right lung atelectasis. 2. CT scan of chest without contrast on February 12, 2010- small to     moderate right-sided pleural effusion without complexity     identified.  Right lower lobe airspace disease most consistent with     pneumonia.  Recommend radiographic follow-up until clearing.     Moderate right pararenal hemorrhage.  Bilateral renal masses of     varying complexity.  Findings are suspicious for spontaneous     hemorrhage.  This could be due to anticoagulation or underlying     renal cell carcinoma versus hemorrhagic cyst. 3. Renal ultrasound on February 13, 2010 -- slight debris or     hemorrhage  in the renal transplant collecting system.  A 3.5 cm     solid appearing echogenic mass-like lesion in the lower pole of the     kidney unchanged in appearance, but not size since the prior study.     I suspect this represents a hemorrhagic cyst, but follow-up in 2     months is recommended. 4. CT scan of abdomen and pelvis without contrast on February 14, 2010     -- acute right renal hemorrhage with a small amount of bleeding     into the right perinephric space.  Hemorrhagic cyst in the lower     pole of the left kidney.  Numerous nodular densities in both     kidneys.  Renal transplant in the right lower quadrant with no     urine excretion on delayed imaging.  Moderate right effusion with     right lower lobe atelectasis.  MRI recommended. 5. 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  Characterization of bilateral renal lesions is     limited due to  the multiplicity and lack of intravenous contrast.     Sterile lesions in both kidneys indeterminate and could represent     proteinaceous cyst although low grade papillary renal cell     carcinomas cannot be excluded.  Recommend further imaging follow up     by MRI in 6 months.  Subcapsular hematoma and mild perinephric     hemorrhage involving the right kidney as seen on CT above.     Transfusional siderosis.  Cholelithiasis. 6. MRI of the pelvis -- renal transplant in the right iliac fossa with     diffuse parenchymal edema.  Differential diagnosis include     transplant rejection and cyclosporin toxicity.  No evidence of     renal transplant mass, hydronephrosis or perinephric fluid     collections. 7. Chest x-ray on February 16, 2010 -- no pneumothorax after right-     sided thoracentesis with interval reduction in right-sided pleural     effusion. 8. Ultrasound-guided thoracentesis -- successful ultrasound-guided     right thoracentesis yielding 310 mL of pleural  fluid.  CONSULTATIONS:  Nephrology -- Darrold Span. Florene Glen, MD  BRIEF HISTORY:  The patient is a 50 year old black male with above- listed medical problems who presented with shortness of breath and fevers.  It was reported that he had undergone an outpatient procedure for the left AV fistula and after that procedure, he developed shortness of breath on his way home and was feeling weak and sick.  He reported that he was short of breath with exertion.  He also reported right-sided upper abdominal/lower chest pain with taking a deep breath.  He followed up for his regular scheduled dialysis on the day of admission and was noted to have a temperature of 104 there.  He admitted to occasional productive cough.  He denied nausea or vomiting.  Also denied diarrhea. He was admitted for further evaluation and management.  HOSPITAL COURSE: 1. Community-acquired pneumonia with large pleural effusion -- upon     admission, blood cultures were obtained and the patient was started     on broad-spectrum antibiotics with vancomycin and Zosyn (given the     fact that the patient also had an AV fistula and was noted to have     redness on his arm on exam per admitting physician).  Blood     cultures were obtained upon admission.  He defervesced on the above     antibiotics and his cough was improving.  The follow-up chest x-ray     done after his hospitalization showed that the pleural effusion was     remaining about the same and so subsequently, the thoracentesis was     done on February 16, 2010, and 310 mL of fluid was removed and sent     for studies.  After the thoracentesis was done, the patient     insisted to the nursing staff that he wanted to go home despite     explanations that the thoracentesis studies were still pending.  He     was alert and lucid and signed out AMA. 2. Right perinephric bleed with left renal lesions -- Nephrology     followed the patient and recommended a follow-up MRI,  which was     done on February 16, 2010 showed cystic renal disease of dialysis     involving the native kidneys.  Characterization of bilateral renal     lesions is limited due to the multiplicity and lack of IV contrast.  Several lesions in both kidneys are indeterminate and could     represent proteinaceous cysts although per Radiology low grade     papillary renal cell carcinomas cannot be excluded.  Follow-up MRI     was recommended in 6 months and the patient is to follow up with     Nephrology for the follow-up MRI.  It was also noted that there was     a subcapsular hematoma with mild perinephric hemorrhage involving     the right kidney.  As noted above, the patient left AMA on February 16, 2010 just after the MRI was done.  The patient to follow up with     Nephrology outpatient.  He also indicated that the patient at [facility]     and that he would follow up there. 3. End-stage renal disease -- the patient was dialyzed by Nephrology     during his hospital stay.  Dr. Abel Presto impression was that the     patient's gross hematuria was probably secondary to acute rejection     and he was treated with IV Solu-Medrol. 4. Hypertension -- the patient was maintained on labetalol during this     hospital stay for blood pressure control.  DISCHARGE MEDICATIONS:  The patient left against medical advice and did not wait for any medications.  FOLLOW-UP CARE:  The patient was to follow up with [facility] for continued dialysis, he was also to follow up at [facility] as he indicated.  DISCHARGE CONDITION:  Stable, as above.  The patient left against medical advice.     Sheila Oats, M.D.     ACV/MEDQ  D:  03/14/2010  T:  03/14/2010  Job:  [id number]  cc:   Dr. Jimmy Footman  Electronically Signed by Minette Headland M.D. on 04/06/2010 10:15:47 AM

## 2010-04-12 LAB — URINALYSIS, ROUTINE W REFLEX MICROSCOPIC
Ketones, ur: NEGATIVE mg/dL
Protein, ur: >300 mg/dL — AB
Urobilinogen, UA: 0.2 mg/dL (ref 0.0–1.0)

## 2010-04-12 LAB — URINE MICROSCOPIC-ADD ON

## 2010-04-14 LAB — BASIC METABOLIC PANEL
BUN: 21 mg/dL (ref 6–23)
CO2: 27 mEq/L (ref 19–32)
GFR calc non Af Amer: 6 mL/min — ABNORMAL LOW (ref >60)
Glucose, Bld: 116 mg/dL — ABNORMAL HIGH (ref 70–99)
Potassium: 5.8 mEq/L — ABNORMAL HIGH (ref 3.5–5.1)

## 2010-04-14 LAB — POCT I-STAT, CHEM 8
BUN: 22 mg/dL (ref 6–23)
Creatinine, Ser: 8.4 mg/dL — ABNORMAL HIGH (ref 0.4–1.5)
Glucose, Bld: 84 mg/dL (ref 70–99)
Hemoglobin: 13.9 g/dL (ref 13.0–17.0)
Potassium: 5.8 mEq/L — ABNORMAL HIGH (ref 3.5–5.1)

## 2010-04-24 LAB — BASIC METABOLIC PANEL
BUN: 50 mg/dL — ABNORMAL HIGH (ref 6–23)
CO2: 24 mEq/L (ref 19–32)
Chloride: 97 mEq/L (ref 96–112)
GFR calc non Af Amer: 4 mL/min — ABNORMAL LOW (ref >60)
Glucose, Bld: 126 mg/dL — ABNORMAL HIGH (ref 70–99)
Potassium: 5.7 mEq/L — ABNORMAL HIGH (ref 3.5–5.1)
Sodium: 136 mEq/L (ref 135–145)

## 2010-04-24 LAB — CBC
HCT: 34 % — ABNORMAL LOW (ref 39.0–52.0)
Hemoglobin: 11.2 g/dL — ABNORMAL LOW (ref 13.0–17.0)
MCHC: 33 g/dL (ref 30.0–36.0)
MCV: 92.9 fL (ref 78.0–100.0)
Platelets: 124 10*3/uL — ABNORMAL LOW (ref 150–400)
RDW: 15.9 % — ABNORMAL HIGH (ref 11.5–15.5)

## 2010-04-24 LAB — DIFFERENTIAL
Basophils Absolute: 0 10*3/uL (ref 0.0–0.1)
Basophils Relative: 1 % (ref 0–1)
Eosinophils Absolute: 0.2 10*3/uL (ref 0.0–0.7)
Eosinophils Relative: 4 % (ref 0–5)
Monocytes Absolute: 0.5 10*3/uL (ref 0.1–1.0)

## 2010-04-24 LAB — PROTIME-INR: Prothrombin Time: 13.4 seconds (ref 11.6–15.2)

## 2010-05-23 NOTE — Assessment & Plan Note (Signed)
OFFICE VISIT   Nathaniel White, Nathaniel White  DOB:  01-26-1960                                       01/13/2007  CHART#:[id number]   REASON FOR VISIT:  Evaluate for graft for pseudo-aneurysm.   HISTORY:  This is a 50 year old gentleman who initially had a right  forearm graft placed by Dr. Scot Dock in June of 2004.  This had to be  revised by Dr. Donnetta Hutching in March of 2007.  He has had no difficulty with  dialysis.  However, he has developed a pseudo-aneurysm on the medial  side of his graft.  He comes in today for further evaluation.  He does  not have any bleeding from the site or any ulceration.  It has not been  infected.  He has noted this for several weeks.   PHYSICAL EXAMINATION:  Vital signs:  Heart rate is 80, respiratory rate  is 18, blood pressure 149/97.  In general, he is well-appearing in no  acute distress.  Cardiovascular is regular rate and rhythm.  Respirations are not labored.  Extremities:  The right forearm, the  graft with palpable thrill.  There is a palpable radial pulse.  There is  aneurismal degeneration on the medial aspect of the graft.  There is no  ulceration associated with this.  There is no erythema.   ASSESSMENT:  Right forearm dialysis graft pseudo-aneurysm.   PLAN:  I feel this needs to be revised as it is increasing in size.  Currently, there is no evidence of infection or ulceration which would  require this to be attended to emergently.  However, I would like to  take care of this in the immediate future.  We have scheduled the  patient for revision of his graft to be performed on Wednesday, January  14.  I plan on revising the arterial  half of the graft.  This was a 4x7 graft initially placed.  The risks  and benefits of the procedure have been discussed with the patient and  informed consent was signed.   [name], MD  Electronically Signed   VWB/MEDQ  D:  01/13/2007  T:  01/14/2007  Job:  [id number]

## 2010-05-23 NOTE — Assessment & Plan Note (Signed)
OFFICE VISIT   Nathaniel White, Nathaniel White  DOB:  [birth year]                                       01/23/2010  CHART#:[id number]   REASON FOR VISIT:  New access.   HISTORY:  This is a 50 year old gentleman with end-stage renal disease  on dialysis Tuesday, Thursday, Saturday.  He had been dialyzing through  a right forearm graft; however, he received a kidney transplant.  Unfortunately this failed prematurely.  He has now been dialyzing  through a catheter for the past month.  Of note, he is left-handed but  he has had a stroke and so now he does everything with his right arm.   PHYSICAL EXAMINATION:  Vital signs:  Heart rate 78, blood pressure  179/99, temperature 97.5.  General:  Well-appearing, no distress.  Cardiovascular:  Palpable left brachial and radial pulse.  Skin:  Without rash.  There is no edema in his upper arms.   DIAGNOSTIC STUDIES:  Vein mapping was performed today.  He has an  adequate basilic vein bilaterally.  His cephalic veins are small.   I think the best plan at this time is a left basilic vein transposition.  This is going to be technically somewhat challenging due to the  contractures in his arm.  However, I do think it is manageable.  I will  need to make sure that tunnel is far anteriorly as possible to make sure  it can be cannulated at dialysis.  We discussed the risks and benefits  including the risk of non-maturity, the risk of steal syndrome, the risk  of infection.  He understands all these and wishes to proceed.  I did  tell him in the event that the basilic vein appeared to be inadequate or  too short, that I would place a left forearm graft at the time of his  operation.     Nathaniel Abrahams, MD  Electronically Signed   VWB/MEDQ  D:  01/23/2010  T:  01/24/2010  Job:  [id number]   cc:   Windy Kalata, M.D.

## 2010-05-23 NOTE — Op Note (Signed)
Nathaniel White, Nathaniel White               ACCOUNT NO.:  [id number]   MEDICAL RECORD NO.:  [id number]          PATIENT TYPE:  AMB   LOCATION:  SDS                          FACILITY:  [facility]   PHYSICIAN:  Theotis Burrow IV, MDDATE OF BIRTH:  [birth year]   DATE OF PROCEDURE:  01/22/2007  DATE OF DISCHARGE:                               OPERATIVE REPORT   PREOPERATIVE DIAGNOSIS:  Right forearm graft pseudoaneurysm.   POSTOPERATIVE DIAGNOSIS:  Right forearm graft pseudoaneurysm.   PROCEDURE:  Replacement of the arterial one-half of the right forearm  dialysis graft.   SURGEON:  1. Leia Alf, M.D.   ASSISTANT:  Darlin Coco, P.A.   ANESTHESIA:  MAC.   BLOOD LOSS:  50 mL.   PROCEDURE:  The patient was identified in the holding area and taken to  room #6.  He was placed supine on the table.  The right arm was prepped  and draped in the standard sterile fashion.  The skin was anesthetized  with 1% lidocaine.  An incision was made in the mid portion of the graft  as well as over the venous limb of the graft below antecubital fossa.  With a combination of cautery and sharp dissection, the graft was  exposed in each location.  Next, a new tunnel was created on the inside  aspect of the previous graft along the course of the pseudoaneurysm.  Once this was done, a 7-mm graft was brought through the tunnel and the  patient was given systemic heparinization.  The arterial side of the  graft was addressed first.  This part of the graft was occluded  proximally and distally and then transected.  The side that would become  defunctionalized was oversewn with a running 5-0 Prolene.  Next, an end-  to-end anastomosis was performed between the old graft and the new  graft.  This was done with a running 6-0 Prolene.  Once the anastomosis  was completed, the clamps were released with pulsatile flows from the  graft.  The graft was the reoccluded.  Next, the venous side of the  graft was  occluded proximally and distally and the graft transected.  The defunctionalized limb was then oversewn with a 5-0 Prolene.  An end-  to-end anastomosis was then created between the old graft and the new  graft using a running 6-0 Prolene.  Prior to completion, the graft was  flushed in an antegrade and retrograde fashion.  The anastomosis was  then completed.  There was a palpable thrill within the graft as well as  the palpable pulses in the radial artery.  Next, hemostasis was achieved  by administering 50 mg of protamine.  Once the wound was hemostatic,  both incisions were closed.  This was done with a layer of 3-  0 Vicryl and 4-0 Vicryl.  Dermabond was then placed.  The patient was  then taken to the recovery room in a stable condition.   There were no complications.           ______________________________  V. Leia Alf, MD  Electronically Signed  VWB/MEDQ  D:  01/22/2007  T:  01/22/2007  Job:  [id number]

## 2010-05-26 NOTE — Discharge Summary (Signed)
[facility]  Patient:    Nathaniel White, Nathaniel White                        MRN: [id number] Adm. Date:  [id number] Disc. Date: [id number] Attending:  Vidal Schwalbe CC:         Sharma Covert. Dreiling, M.D.   Discharge Summary  PRIMARY CARE PHYSICIAN:  Casey Burkitt, M.D.  DISCHARGE DIAGNOSES: 1. Gastroenteritis - etiology unclear - questionable food poisoning. 2. Uncontrolled hypertension. 3. Renal insufficiency - chronic - baseline creatinine 1.7. 4. Thrombocytopenia - unknown etiology - baseline platelet count 70,000 to    80,000. 5. Hypokalemia - secondary to vomiting and diarrhea - resolved. 6. Nephrolithiasis - also had pulmonary edema secondary to history of    uncontrolled hypertension/hypertensive malignancy.  DISCHARGE MEDICATIONS: 1. Norvasc 10 mg q.d. 2. Toprol 200 mg q.d. 3. HCTZ 25 mg one q.d. 4. Ciprofloxacin 500 mg - one b.i.d. for four days, then stop. 5. Flagyl 500 mg one t.i.d. for four days, then stop.  CONSULTATIONS:  Dr. Marlou Starks, with [facility].  PROCEDURES:  CT scan of the abdomen and pelvis on May 21, 2000 - small bilateral renal calculi.  Hepatic fluid minimal inflammatory change surrounding the appendix with several distended fluid-filled small bowel loops and questionable area of bowel wall thickening in the small bowel.  FOLLOW-UP:  The patient will call Dr. Jones Bales office for follow-up in seven to 10 days status post discharge.  He is provided with the office number to arrange for such follow-up.  It is recommended at the time of follow-up that a basic metabolic panel be obtained to assure that the patients potassium balance has not been deleteriously affected with the new onset dosing of HCTZ.  HISTORY OF PRESENT ILLNESS:  Nathaniel White is a 50 year old male with a history of poorly controlled hypertension who presented on the date of admission with 8/10 abdominal pain greatest in the suprapubic area which had been  present for 48 hours.  Patient had had diarrhea of a very loose and watery nature three times a day prior to admission with vomiting three times a day for 48 hours. This was associated with a significantly decreased appetite.  The patient did not have fevers or chills and no hematuria or dysuria.  There was no visible blood in his stool.  Patient had been using Alka-Seltzer over the counter without significant relief.  At time of admission, patient admitted to not taking his Toprol for at least three days though he stated he had been compliant with Norvasc.  HOSPITAL COURSE: #1 - GASTROENTERITIS:  Patient was admitted to the acute units because of the severity of pain and inability to tolerate p.o. intake.  He was treated with IV fluid and initially was placed empirically on IV Cipro and Flagyl as empiric coverage for possible infectious colitis.  CT scan of the abdomen was obtained and, originally, the question of a possible early appendicitis was raised.  CT scan was reviewed by the surgical consultant who did not feel that findings were consistent with appendicitis and, that specifically, the appendix did fill with contrast and the appendiceal wall was of normal dimensions.  The patient was maintained on IV fluid and observed and, during course of hospitalization, patients abdominal pain resolved.  His diet was able to be advanced without difficulty and the patient felt much improved at time of discharge.  He was tolerating a full diet and vital signs were stable. Full  history during hospitalization did reveal that the patients symptoms came on approximately five to six hours after ingesting a sandwich containing ham and cheese which the patient states "tasted funny."  This raised the possibility of food poisoning as the etiology of the patients symptoms.  Of note, the patient was guaiac positive on admission.  Hemoglobin was initially somewhat depressed but was stable at 13 which is  well within normal range at time of discharge.  MCV was normocytic.  It was felt that this patients heme-positive stools were most likely secondary to irritation of gastric mucosa from vomiting and diarrhea and it is recommended that follow-up guaiacs and CBCs be obtained to assure that this is not a chronic problem.  On the date of discharge, patient was cleared from the surgical standpoint and decision was made to complete a seven-day course of empiric therapy with Cipro and Flagyl for possible infectious colitis.  Patient was instructed to return to the hospital or call his physician should he develop fevers or recurrence of severe abdominal pain.  Of note, during hospitalization, a random CK was appreciated to be elevated at 515.  Follow-up of this was obtained and, on date of discharge, CK was noted to be 67.  LDH was added and noted to be slightly elevated at 305.  This was felt to be likely secondary to GI stress and muscular irritation from retching and vomiting during the acute illness. As patient was stable vital-sign-wise and symptom-wise with no findings on exam, he was cleared for discharge.  #2 - UNCONTROLLED HYPERTENSION:  Patient has a long history of uncontrolled hypertension.  Previously, a significant problem has been the patients compliance with his medications.  During this hospitalization, blood pressure control was difficult secondary to aggravation of hypertension with pain. Advantage was taken of the inpatient hospitalization to titrate the patients medications upward, and Toprol was increased at 200 mg of the XL form q.d. Patient tolerated this well with heart rate well above the 70s throughout hospitalization.  At time of discharge blood pressure continued to be somewhat elevated, specifically with elevation of the diastolic pressure as well as systolic pressure.  A third agent was added as noted above.  HCTZ is a new prescription for the patient and it is  recommended that a basic metabolic panel be obtained in follow-up to assure that the patients potassium is not  deleteriously effected.  At time of discharge, potassium was normal at 3.5.  #3 - HISTORY OF RENAL INSUFFICIENCY:  Patient has a history of renal insufficiency with a baseline creatinine of anywhere from 1.7 to 2.0.  During this hospitalization, these values for creatinine were noted.  At initial hospitalization, creatinine was noted to be somewhat elevated above this baseline at 2.3, and this was felt to be likely secondary to vomiting and diarrhea causing volume depletion.  With IV hydration creatinine returned to its baseline level of 1.7 to 1.9 and remained there throughout remainder of hospitalization.  #4 - THROMBOCYTOPENIA:  The patient has a known history of a thrombocytopenia. Outpatient workup of this problem is unclear at this time.  It is recommended that consideration be given to further outpatient evaluation.  At time of discharge, the patients platelet count was stable at 81,000.  CONDITION AT DISCHARGE:  At time of discharge, patient was tolerating a full diet with no difficulties.  He was ambulating in his room without difficulty. Vital signs were stable and the patient was afebrile.  Patient was cleared for discharge home with  follow-up as above. DD:  05/23/00 TD:  05/24/00 Job: [id number]

## 2010-05-26 NOTE — Discharge Summary (Signed)
[facility]  Patient:    Nathaniel White, Nathaniel White Visit Number: [id number] MRN: [id number]          Service Type: MED Location: 3W [id number] [id number] Attending Physician:  Nathaniel White Dictated by:   Nathaniel White, M.D. Admit Date:  02/10/2001 Discharge Date: 02/13/2001   CC:         Nathaniel White, M.D.  Nathaniel White, M.D.   Discharge Summary  DISCHARGE DIAGNOSES:  1. Hypertensive crisis.     a. Noncompliance.  2. Acute congestive heart failure secondary to hypertension.     a. History of similar in 2001.     b. A 2-D echo mild hypertensive cardiomyopathy, ejection fraction        40-50%.  3. Acute on chronic renal failure.     a. Secondary to hypertension plus possibly nonsteroidal anti-inflammatory        drugs.     b. Baseline creatinine May 2002 was 1.8.     c. Renal ultrasound (February 2003):  Medical renal disease.  4. Hypokalemia secondary to diuretics, replaced.  5. Iron-deficiency anemia.     a. No evidence of hemolysis.     b. Received intravenous iron this admission.     c. Outpatient workup when stable.  6. Hemoglobin May 2002 was 13.0, discharge hemoglobin 9.2, MCV 75.  7. Lower extremity edema, resolved.     a. No clinical evidence of deep venous thrombosis.     b. Secondary to congestive heart failure.  8. Ongoing tobacco use.     a. A half a pack per day for 18 years.     b. History of thrombocytopenia, idiopathic (May 2002) - resolved.  9. History of nephrolithiasis.     a. Outpatient workup. 10. No known drug allergies. 11. Gallstone by ultrasound.  DISCHARGE MEDICATIONS:  The patient was not on any medications prior to admission, except NSAIDs: 1. Normodyne 300 mg 1 q.8 a.m. and q.8 p.m. 2. Lasix 80 mg 2 p.o. q.8 a.m. and 2 p.o. q.4 p.m. 3. Norvasc 10 mg q.a.m. 4. Clonidine 0.1 mg p.o. q.8 a.m. and q.8 p.m. 5. Pepcid 20 mg q.d. 6. Nephro-Vite 1 daily. 7. Iron sulfate 325 mg p.o. t.i.d. 8. Colace 100 mg p.o. b.i.d. (hold for  loose stools). 9. Tylenol 325 two q.6h. if needed for pain.    *The patient was instructed not to take any medications, except for the    above listed.  CONDITION UPON DISCHARGE:  Stable.  VITAL SIGNS:  Blood pressure 135/84, heart rate 76, no evidence of orthostasis.  DISPOSITION:  Home.  RECOMMENDED ACTIVITY:  As tolerated.  He may return to work after his appointment on Thursday, February 13.  He apparently is only scheduled to work this Sunday night and Wednesday night, and I will excuse him from those days.  RECOMMENDED DIET:  Low salt, low fat.  SPECIAL INSTRUCTIONS: 1. Do not smoke.  No alcohol. 2. Call or return if you problems.  FOLLOWUP: 1. The patient has an appointment for blood work at [facility] on    Monday, February 10, at 2 p.m.  They will also check his blood pressure.    He will be seeing Dr. Jimmy White on Thursday, February 13, at 9:30.  He    was given that number to call for directions. 2. The patient is to contact Dr. Valere White to schedule followup in 2-3 weeks.    This is for a followup concerning his smoking cessation and other health  maintenance.    *Dr. Jimmy White will be following his anemia and deciding about using    Procrit as well as his renal function and blood pressure medications for    the time being.  CONSULTANTS:  Nathaniel White/Nathaniel White.  PROCEDURES: 1. EKG:  Sinus tachycardia at a rate of 117.  Left atrial enlargement.    Left ventricular hypertrophy, nonspecific changes anterior laterally    with T-wave inversion in V3 through V6. 2. Chest x-ray (February 4):  Cardiomegaly with interstitial edema. 3. Renal ultrasound (February 3):  Echogenic renal parenchyma which is    consistent with the patients history of renal disease.  There are    small echogenic foci within the kidneys bilaterally (two on the right    and one on the left) which could represent small angiolipomata or    prominent renal sinus fat.  This could be  further evaluated with a    noncontrast renal CT, given the patients history of renal failure.    Of note, an ultrasound done in September 2001, demonstrated similar    findings.  There was increased renal parenchyma echogenicity consistent    with renal disease.  Incidentally, there was a gallstone noted. 4. 2-D echo (February 3):  Left ventricular size was at the upper limits    of normal.  Overall left ventricular systolic function was mildly    decreased.  Ejection fraction estimated at 40-50%.  Mild diffuse left    ventricular hypokinesis.  Wall thickness was mildly to moderately    increased.  Mild mitral valvular regurgitation.  Left atrial size was    at the upper limits of normal. 5. Lower extremity Dopplers:  No evidence of DVT.  HOSPITAL COURSE: #1 - HYPERTENSIVE CRISIS WITH CONGESTIVE HEART FAILURE:  Nathaniel White is a 50 year old African American male with history of hypertension and comes in off his medications for several months.  On presentation, his blood pressure is 246/158, heart rate is 121.  Chest x-ray and exam are consistent with congestive heart failure and he has lower extremity edema.  He is admitted and placed on a nitroglycerin drip and diuresed.  He is initially placed in the intensive care unit.  He had demonstrated no evidence of encephalopathy, but did have acute on chronic renal insufficiency which is discussed below. Ultimately, a 2-D echo was done here which demonstrated diminished LV function with global hypokinesis.  His EF is 40-50%.  I discussed this with the cardiologist who read the 2-D echo.  There was no focal wall motion abnormalities and this was most consistent with a hypertensive cardiomyopathy. No further workup is indicated unless he develops chest pain.  He will go home on four medications to control his blood pressure.  Ultimately, it would be  helpful to get him off clonidine given the sedative side effects, but for right now, he will be  maintained on low dose.  Dr. Jimmy White will be following up his blood pressure next week.  #2 - ACUTE ON CHRONIC RENAL FAILURE:  Nathaniel White has a baseline creatinine documented since May 2002, at 1.8.  It is felt that his renal insufficiency is likely due to the severe hypertension, plus occasional NSAID use.  Renal ultrasound back in September 2001, and again this admission, is unchanged. There is no evidence of hydronephrosis.  He was aggressively diuresed with his congestive heart failure and with the control of blood pressure and decreased renal perfusion, his creatinine rose.  At the time of discharge, his  creatinine was 4.0.  When he was admitted, his creatinine was 3.4.  Again, now this is not unexpected given the decrease in renal blood flow.  He will be followed closely by Dr. Jimmy White.  It would be imperative in the long-term to control his blood pressure.  Of note, initial urinalysis only had 30 protein in it and 24-hour urine collection was not felt to be relevant at this time.  #3 - IRON-DEFICIENCY ANEMIA:  There was also a component of chronic disease. We did have initial concerns for possible hemolysis, but LDH haptoglobin and peripheral smear were normal.  His total iron was low at 24 with a TIBC of 289 and a percent saturation of 8.  Ferratin was 47.  TSH was 0.760.  His MCV was 75.  Because of profound deficiency, he did receive IV iron this admission as ordered by nephrology.  He will follow up in the office and then we will consider Epogen at that time.  As far as working up his iron deficiency, he was guaiac negative this admission, but this will need to be followed closely. He is on Pepcid for GI prophylaxis, but he has had no official workup or endoscopies.  #4 - TOBACCO ABUSE:  Counseled on cessation.  #5 - HISTORY OF NEPHROLITHIASIS:  Asymptomatic this admission, but consider workup in the future.  Dr. Jimmy White is aware.  DISCHARGE LABORATORIES:  Sodium 138,  potassium 4.1, chloride 104, bicarb 28, BUN 36, creatinine 4.0, glucose 101.  Hemoglobin 9.2, MCV 75, WBC 3900, platelet count 164.  Urine eosinophils negative.  Haptoglobin 148.  PTH intact pending.  Albumin 2.8.  Phosphorus 4.7.  Magnesium 2.0.  TSH 0.760.  Iron studies as above.  Urinalysis negative except for 30 protein.  Greater than 30 minutes spent arranging discharge and dictating. Dictated by:   Nathaniel White, M.D. Attending Physician:  Nathaniel White DD:  02/13/01 TD:  02/15/01 Job: [id number]

## 2010-05-26 NOTE — Consult Note (Signed)
[facility]  Patient:    ZACKERIAH, SHATZER                        MRN: [id number] Proc. Date: 05/21/00 Adm. Date:  [id number] Attending:  Vidal Schwalbe                          Consultation Report  HISTORY OF PRESENT ILLNESS:  Mr. Crispino is a 50 year old black male who presented to the emergency room tonight with a 24- to 48-hour history of nausea, vomiting, diarrhea and abdominal pain.  He states that he has had an episode like this about a year ago but did not require surgery.  The pain at that time felt similar to the pain that he has now.  He has not had any fevers.  He currently feels a little bit better than he did at the time of presentation to the emergency department.  He states that he is hungry and would eat if I brought him food.  He has had over the last two days, several episodes of both vomiting and diarrhea.  He denies any chest pains, shortness of breath, dysuria, constipation.  His other review of systems is unremarkable.  PAST MEDICAL HISTORY:  Significant for poorly controlled hypertension, chronic renal insufficiency, thrombocytopenia.  PAST SURGICAL HISTORY:  None.  MEDICATIONS:  His medications include Norvasc 10 mg a day and Toprol-XL 100 mg a day, which he does not take.  ALLERGIES:  No known drug allergies.  SOCIAL HISTORY:  He quit smoking about four days ago and denies any alcohol use.  FAMILY HISTORY:  Significant for diabetes and hypertension.  PHYSICAL EXAMINATION:  GENERAL:  He is a well-developed, well-nourished, young black male in no acute distress.  SKIN:  His skin is warm and dry with no jaundice.  HEENT:  Extraocular muscles are intact.  Pupils are equal, round and reactive to light.  NECK:  No bruits.  LUNGS:  Clear to auscultation bilaterally.  HEART:  Regular rate and rhythm.  ABDOMEN:  Soft, nondistended and very mildly tender in the suprapubic region with no guarding or peritoneal  signs.  EXTREMITIES:  He has no clubbing, cyanosis, or edema.  NEUROLOGIC:  He is alert and oriented x 4.  ______:  I can palpate no lymphadenopathy.  LABORATORY DATA:  Lab work was significant for a WBC of 15,600 with 82% segs, 9% lymphs, 9% monos, a hemoglobin of 16.2 and a platelet count of 51,000.  His sodium of 137, potassium 3.1, chloride 102, CO2 26, BUN 31, creatinine 2.3, glucose 113.  He had a CT scan of his abdomen and pelvis which showed some fluid above his liver; he also had some possible thickening of some small bowel loops.  His appendix was visualized.  The appendix was not enlarged and the appendix filled with contrast and air.  There was a very small amount of inflammation in the general area of the appendix.  ASSESSMENT AND PLAN:  This is a 50 year old black male with nausea, vomiting and diarrhea.  Certainly, his differential would include an early appendicitis, although I think this is less likely, given that his appendix fills freely with contrast and does not appear to be enlarged.  I would favor more towards a possible gastroenteritis.  I would recommend continuing his bowel rest and n.p.o. status and put him on some broad-spectrum antibiotics with serial monitoring and checking and repeats of his  white count.  If he does not improve, he may require a diagnostic laparoscopy to further evaluate his appendix and the rest of his abdomen.  If he improves, then he may not require any surgery.  We will certainly follow along closely with you while he is here in the hospital. DD:  05/21/00 TD:  05/21/00 Job: [id number]

## 2010-05-26 NOTE — H&P (Signed)
[facility]  Patient:    Nathaniel White, Nathaniel White                        MRN: [id number] Adm. Date:  [id number] Attending:  Tyrone Apple                         History and Physical  DATE OF BIRTH:  1960-10-29.  CHIEF COMPLAINT:  Shortness of breath and lethargy.  HISTORY OF PRESENT ILLNESS:  Mr. Behringer is a 50 year old African-American with a significant history of hypertension, who is followed by Dr. Quita Skye T. Dreiling.  He reported to [facility] Emergency Room this morning with complaints of significant increase in shortness of breath and lethargy, which have been present for approximately two weeks now.  The patient admits that he has not been taking his antihypertensive for many months now.  He does state that he quit smoking approximately two to three weeks ago and that since that time, he developed progressive shortness of breath and lethargy on minimal physical exertion.  He denies acute visual change or acute sensory loss or focal weakness.  He denies hematuria or hematochezia.  He does state that he has been coughing up a moderate amount of clear sputum since he discontinued smoking but that this has been steadily clearing for the last two weeks.  He presently denies a headache, dizziness, flank pain or abdominal pain.  At this time, he denies chest pain which would be indicative of an anginal-type symptom and he states that he has never had any symptoms such as that.  He does report a previous hospitalization secondary to uncontrolled hypertension in July of 2000.  REVIEW OF SYSTEMS:  HEENT:  No acute visual change.  No visual blurring.  No sore throat.  No cough.  No difficulty swallowing.  NECK:  No swelling.  No lymphadenopathy, per patient.  No stiffness.  ENDOCRINE:  No heat or cold intolerance.  No mood swings.  LUNGS:  As per HPI but negative for wheezing or hemoptysis.  CARDIOVASCULAR:  As per history of present illness, with  no significant anginal-type chest pain or palpitations.  ABDOMEN:  No GI discomfort, no indigestion, no vomiting, no hematochezia, bloating, no gas. Good appetite.  EXTREMITIES:  No edema.  No weakness.  No cutaneous breaks. NEUROLOGIC:  No confusion.  No severe headaches.  No throbbing sensation within the head.  No acute visual loss.  No hearing difficulties.  MEDICAL HISTORY 1. Uncontrolled hypertension diagnosed approximately two years ago, with    previous admission, July 2000, for hypertensive crisis. 2. No significant history of coronary artery disease or angina. 3. No surgical history. 4. History of tobacco abuse in the amount of one pack per day x 16 years;    discontinued one month prior to admission.  MEDICATIONS:  Procardia, prescribed by primary physician but never adhered to, per patient.  ALLERGIES:  No known drug allergies.  FAMILY HISTORY:  Positive for hypertension in patients mother that is poorly controlled, per his report.  The father is healthy with no apparent medical problems.  The patient has multiple siblings who are all healthy, per his report.  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  He lives in [place].  He is engaged. He has one child who is healthy.  He denies alcohol abuse and tobacco abuse, as is listed above.  He specifically denies the use of illicit drugs at this time.  PHYSICAL EXAMINATION  GENERAL:  Patient is a young African-American male who appears stated age, is lying comfortably on a stretcher and in no apparent distress.  VITALS:  Afebrile.  Blood pressure 235/152, pulse rate of 114, respiratory rate of 28, O2 saturation 98% on 2 L nasal cannula.  HEENT:  Normocephalic, atraumatic.  Pupils equal, round and reactive to light and accommodation.  Fundi difficult to visualize but no apparent hemorrhages or vascular signs, with clear fundi.  Nasal mucosa intact with  no discharge. OC/OP clear.  Hearing grossly intact bilaterally.  NECK:  No thyromegaly.  No lymphadenopathy.  No apparent JVD.  LUNGS:  Crackles throughout bilateral lower fields extending to mid-fields bilaterally with no wheeze appreciable.  CARDIOVASCULAR:  Regular rate and rhythm, without murmur, gallop or rub.  ABDOMEN:  Thin, nontender, nondistended, soft.  Bowel sounds present.  No hepatosplenomegaly.  No ascites.  EXTREMITIES:  No cyanosis, clubbing or edema throughout bilateral upper and lower extremities.  CUTANEOUS:  No obvious ulcerations or wounds at this time.  GU:  Unremarkable.  NEUROLOGIC:  Cranial nerves II-XII intact throughout bilaterally.  Strength 5/5 throughout bilateral upper and lower extremities.  Alert and oriented x 4. Intact sensation to touch throughout bilateral upper and lower extremities. DTRs 2+ throughout.  ADMISSION LABORATORY AND X-RAY FINDINGS:  EKG:  Sinus tachycardia with a rate of 126 beats per minute with evidence of left atrial enlargement and left ventricular hypertrophy and inverted T waves in V4 through V6 but no evidence of acute MI at this moment.  Admission chest x-ray:  Bilateral pulmonary edema obvious with cardiomegaly.  Sodium 138, potassium 2.9, chloride 107, bicarb 23, glucose 108, BUN 26, creatinine 3.3, calcium 8.9, total protein 7.4, albumin 3.9, SGOT 33, SGPT 13, alkaline phosphatase 71, total bilirubin 1.3.  PTT 35, PT 13.5, INR 1.1. White blood count 8.2, hemoglobin 11.4, hematocrit 32.6, MCV 85.4, platelet count 68,000, absolute neutrophil count 5.4.  CK 235, CK-MB 2.1, relative index 0.9, troponin I 0.06.  IMPRESSION AND PLAN 1. Hypertensive crisis:  The patient was placed on nitroglycerin drip per the    emergency room physician and this is being continued at this time.  Patient    will be admitted to a step-down bed for close monitoring of blood pressure    and continuation of nitroglycerin drip.   Additionally, I am supplementing    the patients blood pressure control with metoprolol 10 mg intravenously,     one-time dose, and then I will begin him on 50 mg p.o. b.i.d., to be    initiated this evening.  It is my hope that the metoprolol will soon take    over and will be able to titrate the nitroglycerin to off soon.  I am    concerned that the patients hypertension has been out of control for quite    some time now, as is evidenced by end-stage disease as listed below.  This    is clearly due to patients own admission of noncompliance with medication    for greater than two years now.  I have spoken with the patient at length    about the dangers of hypertension and the need for long-term control and    will continue to educate him during his hospitalization. 2. Pulmonary edema:  The etiology of pulmonary edema is clearly his    uncontrolled hypertension.  Hopefully, with nitroglycerin as well as  metoprolol and blood pressure control, we will be able to quickly resolve    this issue.  He will be diuresed with Lasix and was initially given 40 mg    in the emergency room and I am soon to dose him with a second 40 mg    intravenous dosing.  I will followed intake and output closely and watch    daily weights.  A chest x-ray will be repeated in the morning.    Supplemental oxygen will be given until such time that pulmonary edema has    resolved. 3. T wave inversion on electrocardiogram:  As noted above, the patient does    have T wave inversions in leads V4 through V6 on his electrocardiogram.  It    is possible that this is related to his poorly controlled hypertension, but    I will continue to follow this closely.  Electrocardiogram will be repeated    this afternoon and cardiac enzymes will be followed to assure that there    has been no direct coronary damage related to his severe hypertension.  We    will specifically have to be worried about cardiac perfusion being     compromised due to this extreme level of hypertension.  The patient is    being placed on a very low-dose aspirin at this time to account for the    possibility of coronary artery disease. 4. Acute renal insufficiency:  Patients baseline creatinine is unavailable at    this time.  I have asked for old charts and will review this soon.  BUN of    26 and creatinine of 3.3, however, do signify a significant amount of renal    damage.  I will watch patient closely and follow the electrolytes closely    as well.  It is my hope that this will resolve as the patients    hypertensive crisis is resolved, but I am afraid that this likely    represents long-term damage related to severely uncontrolled hypertension    for a number of years.  I will repeat a BMET today to recheck the patients    creatinine and this will be kept in mind for any contrasted studies    considered during hospitalization. 5. Thrombocytopenia:  It is unclear what the etiology of this patients    thrombocytopenia is at this moment.  Platelets are not dropped to a    significant level that I would be concerned of bleeding at this time, but    this will be followed closely and the CBC will be rechecked this afternoon.    It is possible that this is related to the patients hypertensive crisis. 6. Anemia:  The patients hemoglobin on admission was 11.4.  The etiology is    unclear at this time.  Stools will be guaiaced and an iron panel will be    studied.  There is no evidence of colon cancer in the family but guaiacs    will be checked and if they are in fact positive, the patient will undergo    a gastrointestinal workup.DD:  09/22/99 TD:  09/22/99 Job: [id number]

## 2010-05-26 NOTE — Discharge Summary (Signed)
NAME:  Nathaniel White, Nathaniel White                           ACCOUNT NO.:  [id number]   MEDICAL RECORD NO.:  [id number]                   PATIENT TYPE:  IPS   LOCATION:  4037                                 FACILITY:  [facility]   PHYSICIAN:  Jarvis Morgan, M.D.                DATE OF BIRTH:  Aug 04, 1960   DATE OF ADMISSION:  06/19/2002  DATE OF DISCHARGE:  07/03/2002                                 DISCHARGE SUMMARY   DISCHARGE DIAGNOSES:  1. Right cerebrovascular accident with left hemiplegia.  2. Hypertension.  3. Congestive heart failure with cardiomyopathy.  4. Chronic renal insufficiency.  5. Depression.  6. Tobacco abuse.   PROCEDURES:  Status post placement of right AVG graft, 06/23/02, per Dr.  Scot Dock.   HISTORY OF PRESENT ILLNESS:  This is a 50 year old left-handed black male  with history of hypertension, chronic renal insufficiency, congestive heart  failure and tobacco abuse, who was admitted 06/13/02 with left-sided  weakness.  There was no chest pain, no nausea or vomiting.  Upon evaluation  MRI showed acute nonhemorrhagic infarction, right corona-radiata.  MRA with  arteriosclerotic changes.  Carotid duplex negative.  Neurology consult, Dr.  Erling Cruz.  Placed on aspirin therapy.  Nephrology consult to Dr. Jimmy Footman for  chronic renal insufficiency and followed.  Plan was for a right AVG graft  per Dr. Scot Dock.  Minimal assisted bed mobility in transfers.  Echocardiogram with ejection fraction 30 to 40.   LATEST CHEMISTRIES:  BUN 33, creatinine 6.7, hemoglobin 13.9.  Admitted for  a comprehensive rehab program.   PAST MEDICAL HISTORY:  1. Hypertension.  2. Congestive heart failure with hypertensive cardiomyopathy.  3. Chronic renal insufficiency with creatinine 5.1.  4. Kidney stones.  5. Depression.   ALLERGIES:  None.   He does smoke 1/2 pack a day.  Denies alcohol.   MEDICATIONS PRIOR TO ADMISSION:  1. Norvasc.  2. Altace.  3. Zaroxolyn.  4. Labetalol.   SOCIAL  HISTORY:  Lives alone in [place].  Independent prior to admission.  On disability x1 year after working at [employer].  Two-level home.  No steps to entry.  There is no local family.  He has good, supportive  friends.   HOSPITAL COURSE:  The patient did well while on rehabilitation services with  therapies initiated on a b.i.d. basis.  The following issues are followed  during the patient's rehab course.  Pertaining to the patient's right  cerebrovascular accident, left-sided weakness gross degraded at 3+ out of 5,  decreased strength to the ankle dorsi and ankle plantar flexion.  He was  fitted with an AFO brace.  He remained on aspirin therapy per neurology  services per Dr. Erling Cruz.  He had no swallowing deficits.  He continued on a  regular consistency diet.  His blood pressures remained monitored on  Labetalol, Norvasc and Lasix with systolic pressures of 78 to 94.  He denied  any headache or dizziness.  He was followed by renal services for his  chronic renal insufficiency with baseline creatinine of 5.1 which was  monitored.  It increased to 6.7.  Placement of right AVG graft placed  06/23/02 per Dr. Scot Dock for anticipated hemodialysis in the future to be  followed by Dr. Jimmy Footman.  During that workup of his chronic renal  insufficiency noted a parathyroid hormone - intact of 234 calcium for PTH of  9.2.  He did have a history of tobacco abuse.  It was discussed at length  the need for cessation of smoking.  The patient was refusing any NicoDerm  patches.  He received counseling for neuropsychology, Dr. Valentina Shaggy for coping  with overall hospital situation.  He participated fully with his therapies.  He was minimal assist for upper and lower body bathing and dressing,  ambulating minimal assist 150 feet using no device.  Supervision for  wheelchair mobility.  Plan was for outpatient therapies as indicated per  rehab services.   Latest labs showed a TTH-intact of 234.7,  calciums for a PTH of 9.2,  hemoglobin 12.8, hematocrit 37.7, platelets 156,000, sodium 138, potassium  3.6, BUN 113, creatinine 6.6.  That was on 06/30/02.   DISCHARGE MEDICATIONS:  1. Normodyne 200 mg three times daily.  2. Norvasc 10 mg twice daily.  3. Lasix 80 mg twice daily.  4. Os Cal 500 mg 2 tablets three times daily.  5. Nephro-Vite daily.  6. Aspirin 81 mg daily.  7. Calcitrol 0.25 mcg daily.  8. Foltx 1 tablet daily.   ACTIVITY:  As tolerated with left foot brace.   DIET:  Renal diet.   SPECIAL INSTRUCTIONS OUTPATIENT:  Rehab therapies to be arranged.  Advise no  smoking, no driving, no alcohol.  He would follow up with Dr. Jarvis Morgan, outpatient rehab services 4 to 6 weeks; Dr. Jimmy Footman, [phone] in  2 weeks; Dr Erling Cruz, [phone], call for appointment; Dr. Deitra Mayo as  advised after recent placement of AVG graft.       Lauraine Rinne, P.A.                     Jarvis Morgan, M.D.    DA/MEDQ  D:  07/02/2002  T:  07/03/2002  Job:  [id number]   cc:   Jeneen Rinks L. Deterding, M.D.  [place]  [place] [address]  Fax: [phone]   Alyson Locket. Love, M.D.  [address] [place] [address]  Fax: [place]. Scot Dock, M.D.  [address]  [place]  [place] [address]  Fax: [phone]    cc:   Joyice Faster. Deterding, M.D.  [place]  [place] [address]  Fax: [phone]   Alyson Locket. Love, M.D.  [address] [place] [address]  Fax: [place]. Scot Dock, M.D.  [address]  [place]  [place] [address]  Fax: [phone]

## 2010-05-26 NOTE — H&P (Signed)
[facility]  Patient:    Nathaniel White, Nathaniel White Visit Number: [id number] MRN: [id number]          Service Type: MED Location: 1E 0102 01 Attending Physician:  Tor Netters Dictated by:   Court Joy, M.D. Admit Date:  02/10/2001   CC:         Biagio Borg, M.D.   History and Physical  DATE OF BIRTH:  1960-01-10.  PROBLEM LIST:  1. Hypertensive crisis.  2. Congestive heart failure secondary to problem #1 and fluid overload.     a. History of congestive heart failure due to uncontrolled hypertension in        2001.     b. No previous 2-D echocardiogram obtained.  3. Acute renal failure on chronic renal insufficiency.     a. Baseline creatinine 1.7 to 1.9, in May 2002.     b. Renal ultrasound in September 2001, consistent with a right upper and        left lower pole angiomyolipoma versus sinus fat.  4. Microcytic anemia.     a. Hemoglobin 9.3, MCV 75.     b. History of hemoglobin of 13.0 in May 2002.     c. Positive NSAID use.  5. Hypokalemia.  6. Lower extremity edema.  7. Tobacco abuse.     a. History of a 1/2 a pack per day x 18 years.  Currently smoking 2        cigarettes a day.  8. History of transient thrombocytopenia, resolved (May 2002).  9. History of viral gastroenteritis in May 2002. 10. Nephrolithiasis in the past. 11. Medical noncompliance.  CHIEF COMPLAINT:  Shortness of breath.  HISTORY OF PRESENT ILLNESS:  The patient is a 50 year old gentleman with a month history of dry cough with occasional whitish sputum, orthopnea, dyspnea on exertion, and lower extremity swelling.  The patient denies fever, chills, chest pain, syncope, focal weakness, skin rash, rhinorrhea, nausea, vomiting, diarrhea, constipation, tarry stools, melena, or bright red blood per rectum, hemoptysis, night sweats, abdominal symptoms or urinary symptoms.  The patient describes headaches for which he takes aleve.  The last time that he  took aleve was about 2 days ago when he took 4 at a time.  The patient was admitted in May 2002, to our service with viral gastroenteritis and uncontrolled hypertension.  Nathaniel White never followed with his primary care Cliff Damiani nor called for medication refills.  The patient stopped probably all of his medications, months ago.  PAST MEDICAL HISTORY:  As in problem list.  ALLERGIES:  None.  MEDICATIONS:  The patient states that once in a while he was taking Toprol 100 mg a day, though he stopped the Norvasc, hydrochlorothiazide, many months ago.  SOCIAL HISTORY:  The patient is divorced.  He has a 7 year old, son.  He smokes as described in problem list.  He denies alcohol or illegal drug use. 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  FAMILY HISTORY:  The patients grandmother had diabetes, his mother had hypertension, his grandfather had metastatic cancer of unknown source.  No strokes or early coronary artery disease in the family.  REVIEW OF SYSTEMS:  As in history of present illness.  PHYSICAL EXAMINATION:  VITAL SIGNS:  Temperature 97.6, blood pressure 246/158, heart rate 121, respirations 20.  HEENT:  Normocephalic and atraumatic.  Nonicteric sclerae.  Conjunctivae mildly pale otherwise it is in the normal limits.  PERRLA, EOMI, funduscopic exam negative for papilledema or hemorrhages.  Moist mucous membranes. Oropharynx clear.  TMs within the normal limits.  NECK:  Supple.  There is JVD at ______ cm at 40 degrees.  No bruits, lymphadenopathy or thyromegaly.  LUNGS:  Bibasilar crackles.  No wheezing.  No rhonchi.  Fair air movement bilaterally.  CARDIAC:  Regular rate and rhythm, slightly tachycardic.  Possible S4, no S3. No murmurs or rubs.  ABDOMEN:  Nontender, nondistended.  Bowel sounds were present.  No hepatosplenomegaly.  No rebound, no guarding, no masses, no bruits.  GENITOURINARY:  Within normal limits.  RECTAL:  Empty vault, normal  sphincter tone.  In the ER there was a negative guaiac stool documented.  EXTREMITIES:  3+ pitting edema.  No cyanosis or clubbing.  Pulses 2+ bilaterally.  NEUROLOGIC:  Alert and oriented x 3.  Strength 5/5 in lower extremities.  DTRs 3/5.  Cranial nerves II-XII intact.  Sensory intact.  Plantar reflexes downgoing bilaterally.  LABORATORY DATA:  EKG consistent with sinus tachycardia with a heart rate of 117.  There is some slight T wave inversion in leads 1, aVL, V3, V4, V5 and V6, there is LVH pattern.  There is a questionable ST segment depression in leads 2, and aVF though I do not think that it is significant.  There is no previous EKG to compare with.  Overall, suspect a LVH cardiac strand pattern and now cardiac ischemia.  Chest x-ray consistent with cardiomegaly and CHF as well as pulmonary vascular congestion.   Hemoglobin 9.3, MCV 75, WBC 6.0, no left shift.  Platelets 156. Sodium 141, potassium 3.3, chloride 111, CO2 23, BUN 28, creatinine 3.4, glucose 105, LFTs within normal limits.  Albumin 3.0.  Troponin I 0.07, CK 184, CK-MB 3.4.  ASSESSMENT AND PLAN:  1. HYPERTENSIVE CRISIS:  As described previously.  The initial blood pressure     245/158 associated with acute renal failure and congestive heart failure.     Initially, the patient was given 80 mg of Lasix IV a 1, and nitroglycerin     drip was started in the emergency department at 3 mcg per minute and     clonidine 0.2 was given.  Currently, the systolic blood pressure has come     down to 160s.  Our plan is to admit Mr. Severs to a step-down unit.     Clonidine, Norvasc and Lasix will be given for now.  Will try to avoid     an ACE inhibitor given the patients acute renal failure.  Also, labetalol     or any blood pressure medication be avoided given the CHF. For now, will     use intravenous hydralazine as needed [id number] if the systolic blood pressure     starts going up 170.  For now we will not use intravenous  nitrite drip.      We had a long conversation about compliance with medications as well as     with diet.  2. CONGESTIVE HEART FAILURE:  I suspect a combination of uncontrolled     hypertension and fluid overload.  As described in the HPI section the     symptoms have been ongoing for at least a month.  Will obtain a 2-D     echocardiogram, a TSH will be obtained, and the cardiac enzymes q.8h. x 3     also will be followed closely.  Right now will continue with Lasix and     Norvasc.  As described previously, a beta blocker and ACE inhibitor will  not be used for now because of blood pressure control and diuresis.  At     this point there is no indication for a cardiolite unless the 2-D     echocardiogram shows wall motion abnormalities.  3. ACUTE RENAL FAILURE ON CHRONIC RENAL INSUFFICIENCY:  As described     previously, baseline creatinine for this gentleman was 1.7 to 1.9 in May     2002.  The basis for this chronic renal insufficiency seems to be chronic     hypertension.  A renal ultrasound obtained in September 2001, showed some     questionable fat and angiomyolipoma in both kidneys.  Given the     progressive renal failure, will obtain a renal ultrasound.  Also, urine     for eosinophils will be obtained given the patients NSAID use.  A renal     artery stenosis work-up may be needed if there is any discrepancy in size     among both kidneys.  A nephrology referral will be obtained once the     patient is ready for discharge.  If the patients renal function were to     get worse, an inpatient renal consult will be obtained.  4. MICROCYTIC ANEMIA:  I suspect anemia of chronic disease due to chronic     renal insufficiency.  The patient denies symptoms of GI bleed though he     takes NSAIDs.  In May 2002, he had guaiac positive stools in the context     of viral gastroenteritis.  As described above the baseline hemoglobin was     13 in May 2002.  For now will start protonix for  GI protection.  The     admitting studies will be obtained.  We will follow up the patients     hemoglobin and guaiac all the stools.  5. HYPOKALEMIA:  Will provide with potassium supplementation aggressively and     a new serum potassium level will be obtained in the morning.  It is     unclear if this lab data results was obtained after the Lasix was given.     hyperaldosteronism 10% with uncontrolled hypertensive crisis and low     potassium.  This work-up will be considered if indicated.  6. LOWER EXTREMITY EDEMA.  I suspect a multifactorial etiology.  Hypo-     albuminemia, chronic renal insufficiency, and possible right-sided     heart failure need to be considered.  His ______ also should be in the differential.  For now will obtain lower extremity venous Dopplers and     will continue with diuresis.  Will follow up with a 2-D echocardiogram to     evaluate the right-sided chambers and pressors.   Also, a 24-hour for     protein may be needed given the patients mild proteinuria on presence     of hypoalbuminemia.  7. TOBACCO USE:  We spent about 10 minutes talking about smoking cessation.     For now the patient declined a nicotine patch. Dictated by:   Court Joy, M.D. Attending Physician:  Tor Netters DD:  02/10/01 TD:  02/10/01 Job: [id number]

## 2010-05-26 NOTE — H&P (Signed)
NAME:  Nathaniel White, Nathaniel White                           ACCOUNT NO.:  [id number]   MEDICAL RECORD NO.:  [id number]                   PATIENT TYPE:  EMS   LOCATION:  ED                                   FACILITY:  [facility]   PHYSICIAN:  Alyson Locket. Love, M.D.                 DATE OF BIRTH:  1960-04-09   DATE OF ADMISSION:  06/13/2002  DATE OF DISCHARGE:                                HISTORY & PHYSICAL   CHIEF COMPLAINT:  This is the first [facility] admission for this  50 year old, left-handed, black, divorced male from [place], [place], admitted in transfer from [facility] emergency room for  evaluation of left hemiparesis and hypertension.   HISTORY OF PRESENT ILLNESS:  Nathaniel White has a greater than two year history  of hypertension and ran out of some of his medications two weeks ago;  currently taking only the Altace and Norvasc.  He had not taken Zaroxolyn or  labetalol.  Yesterday, he had been staying up all night with a friend who  was in the emergency room after an accident.  That day he went home, and  about 8 p.m. laid down because of feeling tired and awoke with left leg  weakness and drooling.  This progressed during the night to cause left leg  weakness, left arm weakness, and dysarthria.  He came to the [facility] emergency room.  There was no associated chest pain, shortness of  breath, palpitations, headache, syncope, or seizure.  He noted no associated  numbness.  In the emergency room, he was given an aspirin.   PAST MEDICAL HISTORY:  1. Hypertension.  2. Tobacco use.  3. Congestive heart failure with hypertensive cardiomyopathy.  4. Chronic renal failure.  5. Nephrolithiasis.  6. History of hypertensive crisis.   MEDICATIONS:  Norvasc, Altace, Zaroxolyn, Labetalol (the exact dosages are  not clear from the patient).   He has not been on aspirin therapy.   ALLERGIES:  He has no known history of allergies.   SOCIAL HISTORY:  He  does smoke cigarettes, a half a pack per day.  He does  not drink alcohol.  Education - he went through college and worked for  [employer], but for the last year has been on disability for  hypertension.   FAMILY HISTORY:  His mother is 64, living and well.  His father is 63,  living and well.  He has a sister who is 75, living and well.  He has one  son, 31, and is divorced.  He lives with his fiancee.   PHYSICAL EXAMINATION:  GENERAL:  A well-developed black male, anxious.  VITAL SIGNS:  Blood pressure in the right arm 180/110, left arm 180/105.  In  lying position, heart rate was 67.  He was afebrile.  There were no bruits.  NEUROLOGIC:  Mental status - he was  alert and oriented x3.  There was no  denial of illness over the left side of his body.  Cranial nerves examination revealed visual fields to be full.  Disks were  flat.  The extraocular movements were full.  Corneals were present.  He had  mild dysarthria in left seventh.  His tongue was midline.  Uvula was  midline.  Gags were present.  His motor examination revealed left leg  greater than left arm weakness, but all in the 4+/5 range with marked  clumsiness of his left hand, arm, and left leg, with inability to perform  heel-to-shin on the left.  He had difficulty touching each thumb to the  fingers of his left hand.  He could open and close his hand.  Sensory  examination was intact to pinprick, touch, and vibration testing.  Deep  tendon reflexes were 2+, and plantar responses were bilaterally downgoing.  HEENT:  The tympanic membranes were clear.  LUNGS:  Clear.  HEART:  No murmurs.  ABDOMEN:  Bowel sounds were normal.  There was no enlargement of the liver,  spleen, or kidneys.  GENITOURINARY:  He was circumcised.   LABORATORY DATA:  CT scan showed decreased density in the right frontal  area.  EKG showed normal sinus rhythm with left atrial enlargement, LDH, Q  wave changes.  White blood cell count was 6500,  hemoglobin 14, hematocrit  41.8, platelets 141,000.  Comprehensive metabolic panel is not returned at  this time.   IMPRESSION:  1. Right brain stroke, code 434.1  2. Hypertension, code 796.2.  3. History of congestive heart failure, code 416.9.  4. Chronic renal failure, code 585.  5. History of renal stenosis, code unknown.  6. Chronic obstructive pulmonary disease, code 496.   PLAN AT THIS TIME:  Admit the patient and place him on aspirin therapy.                                               Alyson Locket. Erling Cruz, M.D.    JML/MEDQ  D:  06/13/2002  T:  06/13/2002  Job:  [id number]   cc:   Jeneen Rinks L. Deterding, M.D.  [place]  [place] [address]  Fax: [phone]

## 2010-05-26 NOTE — Op Note (Signed)
NAME:  Nathaniel White, POLISENO                           ACCOUNT NO.:  [id number]   MEDICAL RECORD NO.:  [id number]                   PATIENT TYPE:  IPS   LOCATION:  4037                                 FACILITY:  [facility]   PHYSICIAN:  Judeth Cornfield. Scot Dock, M.D.        DATE OF BIRTH:  1960/04/14   DATE OF PROCEDURE:  06/23/2002  DATE OF DISCHARGE:                                 OPERATIVE REPORT   PREOPERATIVE DIAGNOSES:  Chronic renal failure.   POSTOPERATIVE DIAGNOSES:  Chronic renal failure.   OPERATION PERFORMED:  Placement of new right forearm arteriovenous graft.   SURGEON:  Judeth Cornfield. Scot Dock, M.D.   ASSISTANT:  Lydia Guiles, P.A.   ANESTHESIA:  Local with sedation.   DESCRIPTION OF PROCEDURE:  The patient was taken to the operating room,  sedated by anesthesia.  The right upper extremity was prepped and draped in  the usual sterile fashion.  After the skin was infiltrated with 1%  lidocaine, a transverse incision was made at the antecubital level.  Here  the cephalic vein was dissected.  It was quite small and not usable for an  upper arm fistula.  Therefore it was decided to place an AV graft.  The  brachial artery and adjacent brachial veins were identified.  The more  medial vein was larger in size and this was dissected out.  A 4 to 7 mm  graft was then tunneled in a loop fashion in the right forearm.  The  arterial aspect of the graft was along the radial aspect of the forearm and  the venous aspect of the graft was along the ulnar aspect of the forearm.  Next, the patient was heparinized.  The brachial artery was clamped  proximally and distally and a longitudinal arteriotomy was made.  A segment  of the 4 mm end of the graft was excised.  The graft slightly spatulated and  sewn end-to-side to the artery using continuous 6-0 Prolene suture.  The  graft was then pulled to the appropriate length for anastomosis to the  brachial vein.  The vein was ligated  distally and spatulated proximally.  It  easily took a 4.5 mm dilator.  The graft stuck to the appropriate length  spatulated end-to-end to the vein using continuous 6-0 Prolene suture.  At  the completion there was a good thrill in the graft and a palpable radial  pulse.  Hemostasis was obtained in the wounds. The wounds were closed with a  deep layer of 3-0 Vicryl and the skin closed with 4-0 Vicryl.  A sterile  dressing was applied, the patient tolerated the procedure well and was  transferred to the recovery room in satisfactory condition.  All needle and  sponge counts were correct.  Judeth Cornfield. Scot Dock, M.D.    CSD/MEDQ  D:  06/23/2002  T:  06/23/2002  Job:  [id number]

## 2010-05-26 NOTE — Op Note (Signed)
NAME:  AMADO, Nathaniel White               ACCOUNT NO.:  [id number]   MEDICAL RECORD NO.:  [id number]          PATIENT TYPE:  OBV   LOCATION:  2550                         FACILITY:  [facility]   PHYSICIAN:  Rosetta Posner, M.D.    DATE OF BIRTH:  [birth year]   DATE OF PROCEDURE:  03/11/2005  DATE OF DISCHARGE:  03/11/2005                                 OPERATIVE REPORT   PREOPERATIVE DIAGNOSIS:  End-stage renal disease with occluded right forearm  loop AV Gore-Tex graft.   POSTOPERATIVE DIAGNOSIS:  End-stage renal disease with occluded right  forearm loop AV Gore-Tex graft.   PROCEDURE:  Thrombectomy interposition jump graft revision to higher basilic  vein of right forearm loop AV Gore-Tex graft.   SURGEON:  Dr. Sherren Mocha Early.   ASSISTANT:  Nurse.   ANESTHESIA:  MAC.   COMPLICATIONS:  None.   DISPOSITION:  Recovery room stable.   PROCEDURE IN DETAIL:  The patient was taken operating room placed supine  position where area of the right arm prepped and draped usual sterile  fashion. Incision was made over the antecubital space at the prior venous  anastomosis. This was to the brachial vein. The graft was opened near the  venous anastomosis, the vein was sclerotic and a Fogarty catheter would not  pass centrally. The graft itself was thrombectomized. The arterialized plug  was removed and excellent inflow was encountered. This flushed with  heparinized and reoccluded. Separate incision was made above the elbow over  the basilic vein and basilic vein was isolated and was of excellent caliber.  The vein was occluded proximally distally, was opened with 11 blade extended  longitudinally with Potts scissors. A 7 mm Gore-Tex graft was sewn end-to-  side to the vein with a running 6-0 Prolene suture. Clamps removed from the  vein, the graft flushed heparinized saline and reoccluded. This was then  tunneled down to the level of the prior graft. The old venous anastomosis  was ligated and the graft  was divided. The interposition graft cut to  appropriate length was sewn end-to-end to the old graft with a running 6-0  Prolene suture. Clamps removed and excellent thrill was noted. Wounds  irrigated saline, hemostasis electrocautery, wounds closed 3-0 Vicryl  subcutaneous subcuticular tissue. Benzoin and Steri-Strips were applied.      Rosetta Posner, M.D.  Electronically Signed     TFE/MEDQ  D:  03/11/2005  T:  03/12/2005  Job:  [id number]

## 2010-05-26 NOTE — H&P (Signed)
[facility]  Patient:    Nathaniel White, Nathaniel White                        MRN: [id number] Adm. Date:  [id number] Attending:  Vidal Schwalbe                         History and Physical  CHIEF COMPLAINT:  Abdominal pain x 48 hours.  HISTORY OF PRESENT ILLNESS:  A 50 year old male with a history of poorly controlled hypertension, who presents with severe 8/10 abdominal pain greatest in the suprapubic area x 48 hours.  Positive for diarrhea, loose, three times per day, positive vomiting three times a day for 48 hours, decreased appetite. Pain worsens with eating.  He has felt hot and cold, but no definite fevers. No hematuria or dysuria.  No visible blood in stool.  He took Alka-Seltzer Plus approximately 1 p.m. today without relief.  He has a history of poorly controlled hypertension.  Last took Toprol XL three days ago and took Norvasc today.  PAST MEDICAL HISTORY:  Hypertension with secondary pulmonary edema with hypertensive malignancy, September 2001.  Chronic renal insufficiency, baseline creatinine September 2001 3.3. and July 2000 was 1.9. Nephrolithiasis, thrombocytopenia, baseline 60-80.  PAST SURGICAL HISTORY:  None.  CURRENT MEDICATIONS: 1. Toprol XL 100 mg p.o. q.d. 2. Norvasc 10 mg p.o. q.d.  ALLERGIES:  No known drug allergies.  SOCIAL HISTORY:  The patient has been divorced for seven years.  He was Scientist, research (physical sciences) at [employer].  A 39 year old son.  Quit tobacco two days ago; history of half pack-per-day.  No alcohol and no drug use.  FAMILY HISTORY:  Positive for mother with hypertension.  Grandfather with "back" cancer.  Maternal grandmother diabetes.  REVIEW OF SYSTEMS:  Negative for all systems groups, except as above.  PHYSICAL EXAMINATION:  GENERAL:  Well-developed, well-nourished, who appears mildly uncomfortable. Alert and pleasant.  VITAL SIGNS:  Temperature 97.1.  Blood pressure initially 216/141 and after Lopressor 5 mg IV  and labetalol 20 mg IV x 2, blood pressure is then 183/118. Pulse 114 and respirations 16.  HEENT:  Tympanic membranes clear bilaterally.  Pupils equal, round, and reactive to light.  Oropharynx clear.  NECK:  Supple, no lymphadenopathy.  CARDIAC:  Regular rate and rhythm without murmurs, S3, or S4.  LUNGS:  Clear to auscultation.  ABDOMEN:  Soft, nondistended, tender in the suprapubic area/right lower quadrant and left midabdomen.  Some slight mild rebound, but no guarding. Positive bowel sounds throughout.  GROIN:  Normal male genitalia.  No hernia, no testicular mass.  RECTAL:  Per EDP is guaiac-positive.  EXTREMITIES:  No edema, 2+ dorsalis pedis bilaterally.  NEUROLOGIC:  Cranial nerves II-XII intact.  Strength 5/5 bilaterally in upper extremities and lower extremities.  Reflexes 2+ and symmetric.  LABORATORY:  White count 15.6, 82% neutrophils, hemoglobin 16.2, lipase less than 5, troponin I 0.06, CK 136 with MB fraction of 2.9.  Electrolytes within normal limits, except potassium of 3.1, BUN 31, creatinine 2.3.  LFTs within normal limits except total bilirubin 1.6.  Urinalysis is pending.  X-RAYS:  EKG unchanged from baseline.  Does show a incomplete left bundle branch block.  QTc is 487 which is similar to baseline.  Acute abdominal series showed dilated loops of bowel in the right upper quadrant.  CT is pending.  ASSESSMENT:  A 50 year old male with acute abdominal pain, nausea and vomiting, diarrhea, guaiac-positive, and  hypertensive urgency.  Differential diagnosis includes viral gastroenteritis, ischemic bowel, diverticulitis, appendicitis.  Acute hypertension is likely secondary to stopping Toprol and the pain in his abdomen.  Blood pressure is much improved after Lopressor 5 mg intravenous and labetalol 20 mg intravenous x 2 in the emergency department.  PLAN:  Admit to telemetry bed, follow blood pressure closely, restart Toprol XL.  CT scan of the abdomen  with contrast.  IV Phenergan p.r.n.  Check urinalysis, sulfa, and potassium.  Follow platelets.  Guaiac-positive stool needs GI evaluation either as an inpatient or outpatient. DD:  05/21/00 TD:  05/21/00 Job: [id number]

## 2010-05-26 NOTE — Discharge Summary (Signed)
Nathaniel White, Nathaniel White                           ACCOUNT NO.:  [id number]   MEDICAL RECORD NO.:  [id number]                   PATIENT TYPE:  INP   LOCATION:  3028                                 FACILITY:  [facility]   PHYSICIAN:  Princess Bruins. Gaynell Face, M.D.           DATE OF BIRTH:  March 30, 1960   DATE OF ADMISSION:  06/13/2002  DATE OF DISCHARGE:  06/19/2002                                 DISCHARGE SUMMARY   DISCHARGE DIAGNOSES:  1. Right corona radiata infarction.  2. History of old right ventricular __________ infarction.  3. Uncontrolled hypertension in the past with hypertensive crisis and     congestive heart failure.  4. Chronic renal failure secondary to hypertension.  5. Congestive heart failure secondary to uncontrolled hypertension with     ejection fraction 30%-40%.  6. Iron-deficiency anemia secondary to chronic disease, Aranesp on hold.  7. Ongoing tobacco abuse.  8. Hyperhomocysteinemia.  9. History of nephrolithiasis.  10.      History of cholelithiasis.  11.      History of idiopathic thrombocytopenic purpura May 2002.  12.      History of angiomyolipoma versus sinus fat at right upper and left     lower kidney poles per ultrasound 2003.  13.      History of medical noncompliance.   DISCHARGE MEDICATIONS:  1. Aspirin 81 mg daily.  2. Normodyne 200 mg t.i.d.  3. Calcitriol 0.25 mcg daily.  4. Os-Cal 500 mg t.i.d. before meals.  5. Norvasc 10 mg b.i.d.  6. Lasix 80 mg daily.  7. Foltx one daily.  8. Altace 10 mg daily.   STUDIES PERFORMED:  1. CT of the head on admission shows an area of questionable right frontal     infarction, probably old.  No acute abnormality.  2. MRI of the brain shows a small to moderate-sized acute nonhemorrhagic     infarct in the posterior right corona radiata.  Old small infarct with     hemorrhage breakdown products, posterior right lenticular nucleus.  Mild     to moderate white matter changes.  3. MRA of the brain shows  atherosclerotic disease.  A small focal     outpouching along the distal left vertebral artery.  Question early     findings of a small aneurysm.  4. Carotid Doppler normal.  5. 2-D echocardiogram with ejection fraction 30%-40%.  Moderate diffuse left     ventricular hypokinesis.  Severe hypokinesis of the basal mid     posterolateral wall.  Left atrium moderately dilated.  Respirophasic     inferior vena cava changes were blunted.  6. EKG shows normal sinus rhythm, left atrial enlargement.  Left ventricular     hypertrophy with QRS widening.  T-wave abnormality.  7. Chest x-ray shows no acute infiltrate or CHF.  No evidence of active     disease.  No interval change.   LABORATORY STUDIES:  Hemoglobin 13.9, hematocrit 41.0, white blood cells  6.4, platelets 172, MCV 86.9, MCHC 33.9, RDW 15.8.  Lowest hemoglobin during  the admission was 13.1.  Chemistry:  Sodium 136, potassium 4.9, chloride  104, CO2 21, BUN 73, creatinine 6.7, glucose 96, albumin 3.6.  Creatinine  has risen during hospitalization from 5.0 up to high of 6.7.  AST 14, ALT 8,  alkaline phosphatase 153, total bilirubin 0.5.  Cardiac enzymes were  negative.  Phosphorus 5.4.  Calcium 9.1.  UA with 0-2 white blood cells, 0-3  red blood cells, and greater than 300 protein.  Homocysteine elevated at  25.48.  Cholesterol 162, triglycerides 122, HDL 43, LDL 95.  Urine drug  screen negative.  Ferritin 332, iron 67, TIBC 244, and percent saturation  27.   HISTORY AND PHYSICAL:  Nathaniel White is a 50 year old left-handed black  male who has a greater than three-year history of significant hypertension  and ran out of his medicines two weeks ago.  Yesterday he was up all night  with a friend who was in the emergency room after an accident.  That day he  went home and lay down about 8 p.m. because of feeling tired.  He awoke  during the night with left leg weakness and drooling.  Symptoms progressed  during the night, and he  presented to [facility] emergency room the next  day for evaluation.  He was not a TPA candidate secondary to time frame.  He  was admitted for further stroke workup.   The patient has a significant history of hypertension and chronic renal  insufficiency.  He is followed by Dr. Jimmy Footman, and he was consulted in  hospital.  His antihypertensives were resumed as at home.  His creatinine  has been gradually increasing over the past one year, and plans are to place  a graft in his right arm.  He had an outpatient appointment on Dr.  Deitra Mayo on June 16.  He saw in hospital and will plan to perform  graft placement next week as planned.  The patient does have a history of  iron-deficiency anemia.  He was on Aranesp at home, and this was stopped  during the hospitalization.   Stroke workup did reveal a right corona radiata infarction with associated  left-sided weakness and dysarthria, which improved some during  hospitalization.  Source is probably multifactorial with atherosclerotic  disease along with hypertension, chronic renal insufficiency, and low  ejection fraction.  The patient was placed on aspirin 81 mg for stroke  prevention.  He was not placed on a higher dose or Plavix added due to  history of TTP.  Other risk factors identified included tobacco abuse, for  which the patient was encouraged to stop.  Homocysteine elevated at 25.48,  and was placed on Foltx.  Rehab saw him during hospitalization and felt like  inpatient rehab would be beneficial prior to return home.   PHYSICAL EXAMINATION AT DISCHARGE:  HEENT:  Alert and oriented x3.  Speech  slightly dysarthric without aphasia.  Left lower facial weakness.  Tongue  deviates to the left.  Extraocular movements are intact.  Visual fields are  full.  CHEST:  Clear to auscultation.  CARDIAC:  His heart rate is regular.  NEUROLOGIC:  His left upper extremity has strength of 3/5 in the shoulder, 4/5 in the biceps, 4-/5  triceps, 3/5 in the fingers, with decreased movement  thumb to finger.  Does have decreased rapid repetitive motion.  Left lower  extremity is 4/5 in major muscle groups except for dorsiflexion of the foot,  which is about 1/5.  Right strength looked normal.  Plantar is down on the  right and up on the left.   PLAN:  1. Transfer to rehab for continuation of PT, OT, and speech therapy.  2. Aspirin 81 mg for stroke prevention.  No higher dose of aspirin or     Plavix.  3. Renal to continue to follow.  4. Graft placement on Tuesday, June 16, by Dr. Deitra Mayo.   FOLLOW-UP:  1. Follow up with Dr. Asencion Partridge Dohmeier one month after discharge from rehab.  2. Continue Foltx for hyperhomocysteinemia.     Burnetta Sabin, N.P.                         Princess Bruins. Gaynell Face, M.D.    SB/MEDQ  D:  06/19/2002  T:  06/20/2002  Job:  [id number]   cc:   Larey Seat, M.D.  [address] [place] [address]  Fax: [place]. Scot Dock, M.D.  [address]  [place]  [place] [address]  Fax: Ludden Deterding, M.D.  [place]  [place] [address]  Fax: [phone]

## 2010-05-26 NOTE — Consult Note (Signed)
NAME:  Nathaniel White, Nathaniel White                           ACCOUNT NO.:  [id number]   MEDICAL RECORD NO.:  [id number]                   PATIENT TYPE:  INP   LOCATION:  3028                                 FACILITY:  [facility]   PHYSICIAN:  Judeth Cornfield. Scot Dock, M.D.        DATE OF BIRTH:  1960-09-17   DATE OF CONSULTATION:  06/16/2002  DATE OF DISCHARGE:                                   CONSULTATION   REASON FOR CONSULTATION:  Hemodialysis access.   HISTORY OF PRESENT ILLNESS:  The patient is a pleasant 50 year old gentleman  whom I had actually seen for hemodialysis access in consultation in June of  2003 reportedly.  Apparently, he has delayed having this placed.  He was  admitted with a right hemispheric stroke and is now in rehabilitation.  Vascular surgery was consulted for placement of an AV fistula or AV graft.   PAST MEDICAL HISTORY:  The patient's past medical history is significant for  chronic renal insufficiency secondary to hypertension.  In addition, he  apparently has a history of cardiomyopathy with congestive heart failure.  He also has a history of nephrolithiasis.   SOCIAL HISTORY:  He does smoke less than a pack a day of cigarettes and has  been smoking for 20 years.   REVIEW OF SYSTEMS:  He has had no recent chest pain or shortness of breath.   PHYSICAL EXAMINATION:  VITAL SIGNS:  Temperature is 98.9.  Blood pressure  131/80, heart rate 79.  He has a palpable brachial and radial pulse bilaterally.  I did not see a  usable forearm cephalic vein on the right side.  LUNGS:  Clear bilaterally to auscultation.   I reviewed his vein mapping about a year ago in our office which showed that  he had a very small cephalic vein at the wrist with a bifid system.  There  was a single vein above the antecubital space with measurements ranging from  2.1 to 2.5 mm.  This was one year ago.   I have recommended that we explore his upper arm cephalic vein on the right  and if this is  adequate, place an AV fistula.  If this is not adequate, then  we plan on placing an AV graft.  He is currently undergoing therapy in rehab  and we will tentatively plan on placing this next week.  If this needs to be  delayed further because of his rehab, we can arrange this as an outpatient  in the future.   Thanks for allowing me to participate in this nice patient's care.                                               Judeth Cornfield. Scot Dock, M.D.    CSD/MEDQ  D:  06/16/2002  T:  06/16/2002  Job:  [id number]

## 2010-06-19 ENCOUNTER — Ambulatory Visit (HOSPITAL_COMMUNITY): Payer: Medicare Other | Attending: Vascular Surgery

## 2010-06-19 DIAGNOSIS — Z452 Encounter for adjustment and management of vascular access device: Secondary | ICD-10-CM

## 2010-06-19 DIAGNOSIS — I120 Hypertensive chronic kidney disease with stage 5 chronic kidney disease or end stage renal disease: Secondary | ICD-10-CM

## 2010-06-19 DIAGNOSIS — N186 End stage renal disease: Secondary | ICD-10-CM

## 2010-09-27 LAB — POCT I-STAT 4, (NA,K, GLUC, HGB,HCT)
Glucose, Bld: 99
HCT: 40
Hemoglobin: 13.6
Operator id: 181601

## 2011-01-11 DIAGNOSIS — N2581 Secondary hyperparathyroidism of renal origin: Secondary | ICD-10-CM | POA: Diagnosis not present

## 2011-01-11 DIAGNOSIS — D509 Iron deficiency anemia, unspecified: Secondary | ICD-10-CM | POA: Diagnosis not present

## 2011-01-11 DIAGNOSIS — N186 End stage renal disease: Secondary | ICD-10-CM | POA: Diagnosis not present

## 2011-01-11 DIAGNOSIS — D631 Anemia in chronic kidney disease: Secondary | ICD-10-CM | POA: Diagnosis not present

## 2011-02-01 DIAGNOSIS — E119 Type 2 diabetes mellitus without complications: Secondary | ICD-10-CM | POA: Diagnosis not present

## 2011-02-05 ENCOUNTER — Encounter (HOSPITAL_BASED_OUTPATIENT_CLINIC_OR_DEPARTMENT_OTHER): Payer: Self-pay | Admitting: *Deleted

## 2011-02-05 ENCOUNTER — Emergency Department (INDEPENDENT_AMBULATORY_CARE_PROVIDER_SITE_OTHER): Payer: Medicare Other

## 2011-02-05 ENCOUNTER — Other Ambulatory Visit: Payer: Self-pay

## 2011-02-05 ENCOUNTER — Emergency Department (HOSPITAL_BASED_OUTPATIENT_CLINIC_OR_DEPARTMENT_OTHER)
Admission: EM | Admit: 2011-02-05 | Discharge: 2011-02-06 | Disposition: A | Discharge status: Home / Self Care | Payer: Medicare Other | Attending: Emergency Medicine | Admitting: Emergency Medicine

## 2011-02-05 DIAGNOSIS — I517 Cardiomegaly: Secondary | ICD-10-CM | POA: Insufficient documentation

## 2011-02-05 DIAGNOSIS — E875 Hyperkalemia: Secondary | ICD-10-CM

## 2011-02-05 DIAGNOSIS — R0602 Shortness of breath: Secondary | ICD-10-CM

## 2011-02-05 DIAGNOSIS — J189 Pneumonia, unspecified organism: Secondary | ICD-10-CM | POA: Diagnosis not present

## 2011-02-05 DIAGNOSIS — J90 Pleural effusion, not elsewhere classified: Secondary | ICD-10-CM | POA: Insufficient documentation

## 2011-02-05 DIAGNOSIS — R918 Other nonspecific abnormal finding of lung field: Secondary | ICD-10-CM

## 2011-02-05 DIAGNOSIS — R1110 Vomiting, unspecified: Secondary | ICD-10-CM | POA: Diagnosis not present

## 2011-02-05 LAB — DIFFERENTIAL
Basophils Relative: 1 % (ref 0–1)
Eosinophils Absolute: 0.3 10*3/uL (ref 0.0–0.7)
Lymphs Abs: 0.9 10*3/uL (ref 0.7–4.0)
Monocytes Relative: 7 % (ref 3–12)
Neutro Abs: 4.2 10*3/uL (ref 1.7–7.7)
Neutrophils Relative %: 72 % (ref 43–77)

## 2011-02-05 LAB — CBC
Hemoglobin: 9.6 g/dL — ABNORMAL LOW (ref 13.0–17.0)
MCH: 30.4 pg (ref 26.0–34.0)
MCHC: 32.2 g/dL (ref 30.0–36.0)
Platelets: 165 10*3/uL (ref 150–400)
RBC: 3.16 MIL/uL — ABNORMAL LOW (ref 4.22–5.81)

## 2011-02-05 LAB — COMPREHENSIVE METABOLIC PANEL
ALT: 9 U/L (ref 0–53)
Albumin: 3.7 g/dL (ref 3.5–5.2)
Alkaline Phosphatase: 75 U/L (ref 39–117)
BUN: 65 mg/dL — ABNORMAL HIGH (ref 6–23)
Chloride: 94 mEq/L — ABNORMAL LOW (ref 96–112)
Potassium: 6.2 mEq/L — ABNORMAL HIGH (ref 3.5–5.1)
Sodium: 139 mEq/L (ref 135–145)
Total Bilirubin: 0.6 mg/dL (ref 0.3–1.2)
Total Protein: 8.1 g/dL (ref 6.0–8.3)

## 2011-02-05 LAB — CARDIAC PANEL(CRET KIN+CKTOT+MB+TROPI)
CK, MB: 3.4 ng/mL (ref 0.3–4.0)
Relative Index: 1.3 (ref 0.0–2.5)
Total CK: 269 U/L — ABNORMAL HIGH (ref 7–232)

## 2011-02-05 MED ORDER — SODIUM POLYSTYRENE SULFONATE 15 GM/60ML PO SUSP
50.0000 g | Freq: Once | ORAL | Status: AC
  Administered 2011-02-05: 50 g via ORAL
  Filled 2011-02-05: qty 240

## 2011-02-05 MED ORDER — LEVOFLOXACIN 500 MG PO TABS
500.0000 mg | ORAL_TABLET | Freq: Every day | ORAL | Status: DC
  Filled 2011-02-05: qty 1

## 2011-02-05 MED ORDER — MOXIFLOXACIN HCL 400 MG PO TABS
400.0000 mg | ORAL_TABLET | Freq: Once | ORAL | Status: AC
  Administered 2011-02-05: 400 mg via ORAL
  Filled 2011-02-05: qty 1

## 2011-02-05 MED ORDER — FUROSEMIDE 10 MG/ML IJ SOLN
60.0000 mg | Freq: Once | INTRAMUSCULAR | Status: AC
  Administered 2011-02-05: 60 mg via INTRAVENOUS
  Filled 2011-02-05: qty 6

## 2011-02-05 MED ORDER — SODIUM BICARBONATE 8.4 % IV SOLN
50.0000 meq | Freq: Once | INTRAVENOUS | Status: AC
  Administered 2011-02-05: 50 meq via INTRAVENOUS
  Filled 2011-02-05: qty 50

## 2011-02-05 MED ORDER — CLONIDINE HCL 0.1 MG PO TABS
0.3000 mg | ORAL_TABLET | Freq: Once | ORAL | Status: AC
  Administered 2011-02-05: 0.3 mg via ORAL
  Filled 2011-02-05: qty 3

## 2011-02-05 MED ORDER — CALCIUM GLUCONATE 10 % IV SOLN
1.0000 g | Freq: Once | INTRAVENOUS | Status: AC
  Administered 2011-02-05: 1 g via INTRAVENOUS
  Filled 2011-02-05: qty 10

## 2011-02-05 NOTE — ED Notes (Signed)
Pt assisted with ambulation to restroom. Pt slightly unsteady d/t previous stroke leaving him with left sided weakness, however pt does ambulate well with assist. Aware to use call bell if unable to ambulate back to room safely.

## 2011-02-05 NOTE — ED Notes (Signed)
Left arm dialysis shunt +thrill/bruit. Pt has old shunt to right arm.

## 2011-02-05 NOTE — ED Notes (Signed)
Pt is out of his clonidine patch x 2 weeks. States he is waiting on his check to go through before getting it.

## 2011-02-05 NOTE — ED Notes (Signed)
Pt returned from XR. Pt placed on monitor and BP cuff with oxygen

## 2011-02-05 NOTE — ED Provider Notes (Signed)
History     CSN: [id number]  Arrival date & time 02/05/11  Jeri Lager   First MD Initiated Contact with Patient 02/05/11 1924      Chief Complaint  Patient presents with  . Cough  . Emesis    (Consider location/radiation/quality/duration/timing/severity/associated sxs/prior treatment) HPI Comments: Pt is dialysis pt, presents with cough productive of white sputum and SOB that started 2 days ago.  Worse when lying flat.  No chest pain, no fever.  No leg pain or swelling.  Had dialysis 2 days ago.  Next will be tomorrow. +n/v today with diarrhea.  No myalgias.  Pt has been out of his clonidine for about 3 days.  Patient is a 51 y.o. male presenting with cough and vomiting. The history is provided by the patient.  Cough This is a new problem. The current episode started more than 1 week ago. The problem occurs constantly. The problem has been gradually worsening. The cough is productive of sputum. There has been no fever. Associated symptoms include shortness of breath. Pertinent negatives include no chest pain, no chills, no headaches, no rhinorrhea and no wheezing.  Emesis  Associated symptoms include cough and diarrhea. Pertinent negatives include no abdominal pain, no arthralgias, no chills, no fever and no headaches.    Past Medical History  Diagnosis Date  . Hypertension   . Renal insufficiency     Past Surgical History  Procedure Date  . Kidney transplant     2011 rejected kidney 2012 back on dialysis    History reviewed. No pertinent family history.  History  Substance Use Topics  . Smoking status: Former Research scientist (life sciences)  . Smokeless tobacco: Not on file  . Alcohol Use: No      Review of Systems  Constitutional: Positive for fatigue. Negative for fever, chills and diaphoresis.  HENT: Negative for congestion, rhinorrhea and sneezing.   Eyes: Negative.   Respiratory: Positive for cough and shortness of breath. Negative for chest tightness and wheezing.   Cardiovascular:  Negative for chest pain and leg swelling.  Gastrointestinal: Positive for nausea, vomiting and diarrhea. Negative for abdominal pain and blood in stool.  Genitourinary: Negative for frequency, hematuria, flank pain and difficulty urinating.  Musculoskeletal: Negative for back pain and arthralgias.  Skin: Negative for rash.  Neurological: Negative for dizziness, speech difficulty, weakness, numbness and headaches.    Allergies  Codeine  Home Medications   Current Outpatient Rx  Name Route Sig Dispense Refill  . ASPIRIN EC 81 MG PO TBEC Oral Take 81 mg by mouth daily.    Marland Kitchen CLONIDINE HCL 0.3 MG/24HR TD PTWK Transdermal Place 1 patch onto the skin once a week. Change on Tuesday    . GUAIFENESIN 100 MG/5ML PO LIQD Oral Take 200 mg by mouth 3 (three) times daily as needed. For cough    . LABETALOL HCL 300 MG PO TABS Oral Take 150 mg by mouth 2 (two) times daily.      BP 160/100  Pulse 80  Temp(Src) 98.9 F (37.2 C) (Oral)  Resp 20  SpO2 97%  Physical Exam  Constitutional: He is oriented to person, place, and time. He appears well-developed and well-nourished.  HENT:  Head: Normocephalic and atraumatic.  Eyes: Pupils are equal, round, and reactive to light.  Neck: Normal range of motion. Neck supple.  Cardiovascular: Normal rate, regular rhythm and normal heart sounds.   Pulmonary/Chest: Effort normal. No respiratory distress. He has no wheezes. He has rales. He exhibits no tenderness.  Abdominal: Soft.  Bowel sounds are normal. There is no tenderness. There is no rebound and no guarding.  Musculoskeletal: Normal range of motion. He exhibits no edema and no tenderness.  Lymphadenopathy:    He has no cervical adenopathy.  Neurological: He is alert and oriented to person, place, and time.  Skin: Skin is warm and dry. No rash noted.  Psychiatric: He has a normal mood and affect.    ED Course  Procedures (including critical care time)  Results for orders placed during the hospital  encounter of 02/05/11  CBC      Component Value Range   WBC 5.8  4.0 - 10.5 (K/uL)   RBC 3.16 (*) 4.22 - 5.81 (MIL/uL)   Hemoglobin 9.6 (*) 13.0 - 17.0 (g/dL)   HCT 29.8 (*) 39.0 - 52.0 (%)   MCV 94.3  78.0 - 100.0 (fL)   MCH 30.4  26.0 - 34.0 (pg)   MCHC 32.2  30.0 - 36.0 (g/dL)   RDW 15.0  11.5 - 15.5 (%)   Platelets 165  150 - 400 (K/uL)  DIFFERENTIAL      Component Value Range   Neutrophils Relative 72  43 - 77 (%)   Neutro Abs 4.2  1.7 - 7.7 (K/uL)   Lymphocytes Relative 15  12 - 46 (%)   Lymphs Abs 0.9  0.7 - 4.0 (K/uL)   Monocytes Relative 7  3 - 12 (%)   Monocytes Absolute 0.4  0.1 - 1.0 (K/uL)   Eosinophils Relative 5  0 - 5 (%)   Eosinophils Absolute 0.3  0.0 - 0.7 (K/uL)   Basophils Relative 1  0 - 1 (%)   Basophils Absolute 0.0  0.0 - 0.1 (K/uL)  COMPREHENSIVE METABOLIC PANEL      Component Value Range   Sodium 139  135 - 145 (mEq/L)   Potassium 6.2 (*) 3.5 - 5.1 (mEq/L)   Chloride 94 (*) 96 - 112 (mEq/L)   CO2 28  19 - 32 (mEq/L)   Glucose, Bld 83  70 - 99 (mg/dL)   BUN 65 (*) 6 - 23 (mg/dL)   Creatinine, Ser 13.40 (*) 0.50 - 1.35 (mg/dL)   Calcium 9.0  8.4 - 10.5 (mg/dL)   Total Protein 8.1  6.0 - 8.3 (g/dL)   Albumin 3.7  3.5 - 5.2 (g/dL)   AST 16  0 - 37 (U/L)   ALT 9  0 - 53 (U/L)   Alkaline Phosphatase 75  39 - 117 (U/L)   Total Bilirubin 0.6  0.3 - 1.2 (mg/dL)   GFR calc non Af Amer 4 (*) >90 (mL/min)   GFR calc Af Amer 4 (*) >90 (mL/min)  CARDIAC PANEL(CRET KIN+CKTOT+MB+TROPI)      Component Value Range   Total CK 269 (*) 7 - 232 (U/L)   CK, MB 3.4  0.3 - 4.0 (ng/mL)   Troponin I <0.30  <0.30 (ng/mL)   Relative Index 1.3  0.0 - 2.5    Dg Chest 2 View  02/05/2011  *RADIOLOGY REPORT*  Clinical Data: Shortness of breath and cough  CHEST - 2 VIEW  Comparison: Cough chest radiograph 02/16/2010  Findings: Stable cardiomegaly.  There is patchy airspace disease at the anterior and posterior right lung base, and there is a small right pleural effusions.   Interstitial lung markings are mildly prominent at both lung bases.  Mild cardiomegaly is stable.  IMPRESSION: 1.  Cardiomegaly with mild bilateral interstitial prominence and a more focal opacity at the right lung base.  Findings could reflect mild interstitial edema with asymmetric airspace edema at the right lung base, or right basilar pneumonia. 2.  Small right pleural effusion.  It is uncertain if this effusion is chronic since it was imaged in February 2012, or recurrent.  Original Report Authenticated By: Curlene Dolphin, M.D.    Date: 02/05/2011  Rate: 95  Rhythm: normal sinus rhythm  QRS Axis: normal  Intervals: normal  ST/T Wave abnormalities: ST depressions inferiorly and ST depressions laterally  Conduction Disutrbances:none  Narrative Interpretation:   Old EKG Reviewed: unchanged    1. Hyperkalemia   2. Pneumonia       MDM  CRF pt with elevated K, creatinine, some SOB.  No pleuritic pain to suggest PE.  Nothing to suggest ACS.  Likely some fluid overload.  CXR shows infiltrate/effusion to RLL, appears similar to past CXR in Feb.  Feel that this is likely chronic, but will tx with abx since cough has been going on for about 2 weeks.  Pt given calcium, lasix, kayexelate for his hyperkalemia.  No EKG changes.  Discussed with Dr. Florene Glen with [facility] who requests that we keep pt here on monitor overnight, give kayexelate and d/c to dialysis in the am.  Can call dialysis center or page Dr. Florene Glen at 6:30am to arrange to get him into earlier group (he is scheduled for 12:30 dialysis.  Will turn pt over to Dr. Christy Gentles.        Malvin Johns, MD 02/26/11 1131

## 2011-02-05 NOTE — ED Notes (Signed)
Dr Tamera Punt at bedside. Pt reports cough (productive of clear sputum) x3 weeks. Worsening SOB since this morning. States has to sleep sitting up. Denies CP. Pt due for dialysis tomorrow.

## 2011-02-05 NOTE — ED Notes (Signed)
Pt aware of plans to hold in ER tonight and reassess K+ in the morning. Pt in agreement with plan not to be admitted at this time. Pt aware he will need to f/u at dialysis in the morning as well.

## 2011-02-05 NOTE — ED Notes (Signed)
Cough vomiting x 3 days pt states he last had dialysis Saturday due again tuesday

## 2011-02-06 MED ORDER — MOXIFLOXACIN HCL 400 MG PO TABS: 400.0000 mg | ORAL_TABLET | Freq: Every day | ORAL | Status: AC

## 2011-02-06 NOTE — ED Notes (Signed)
Pt. Ambulatory to [facility], NAD noted

## 2011-02-06 NOTE — ED Notes (Signed)
Attempting to call ride for pt to be transported to dialysis treatment center

## 2011-02-06 NOTE — ED Notes (Signed)
Pt. OOB to BR, NAD noted

## 2011-02-06 NOTE — ED Provider Notes (Signed)
PT STABLE OVERNIGHT NOT HYPOXIC WALKING AROUND ED IN NO DISTRESS HE HAS HAD BOWEL MOVEMENTS SINCE STARTING KAYEXALATE D/W DR POWELL, PT CAN GO TO HIS NORMAL DIALYSIS CENTER NOW CXR REVIEWED - WE AGREED TO START HIM ON ABX, BUT THIS COULD BE CHRONIC CHANGES ON CXR.  DO NOT FEEL HE REQUIRES ADMISSION AT THIS TIME BP 161/95  Pulse 96  Temp(Src) 98.9 F (37.2 C) (Oral)  Resp 20  SpO2 95% The patient appears reasonably screened and/or stabilized for discharge and I doubt any other medical condition or other [facility] requiring further screening, evaluation, or treatment in the ED at this time prior to discharge.   Sharyon Cable, MD 02/06/11 [phone]

## 2011-02-06 NOTE — ED Notes (Signed)
Pt ambulatory to restroom again. Pt states had a large bowel movement the first time, and feels the need for same again.

## 2011-02-06 NOTE — ED Notes (Signed)
Pt will continue to be a hold in ER until dialysis in the am.

## 2011-02-08 DIAGNOSIS — N186 End stage renal disease: Secondary | ICD-10-CM | POA: Diagnosis not present

## 2011-02-10 DIAGNOSIS — N2581 Secondary hyperparathyroidism of renal origin: Secondary | ICD-10-CM | POA: Diagnosis not present

## 2011-02-10 DIAGNOSIS — D631 Anemia in chronic kidney disease: Secondary | ICD-10-CM | POA: Diagnosis not present

## 2011-02-10 DIAGNOSIS — D509 Iron deficiency anemia, unspecified: Secondary | ICD-10-CM | POA: Diagnosis not present

## 2011-02-10 DIAGNOSIS — N186 End stage renal disease: Secondary | ICD-10-CM | POA: Diagnosis not present

## 2011-03-08 DIAGNOSIS — N186 End stage renal disease: Secondary | ICD-10-CM | POA: Diagnosis not present

## 2011-03-08 DIAGNOSIS — D509 Iron deficiency anemia, unspecified: Secondary | ICD-10-CM | POA: Diagnosis not present

## 2011-03-10 DIAGNOSIS — N2581 Secondary hyperparathyroidism of renal origin: Secondary | ICD-10-CM | POA: Diagnosis not present

## 2011-03-10 DIAGNOSIS — D631 Anemia in chronic kidney disease: Secondary | ICD-10-CM | POA: Diagnosis not present

## 2011-03-10 DIAGNOSIS — N186 End stage renal disease: Secondary | ICD-10-CM | POA: Diagnosis not present

## 2011-03-10 DIAGNOSIS — D509 Iron deficiency anemia, unspecified: Secondary | ICD-10-CM | POA: Diagnosis not present

## 2011-03-13 ENCOUNTER — Other Ambulatory Visit: Payer: Self-pay

## 2011-03-13 ENCOUNTER — Emergency Department (INDEPENDENT_AMBULATORY_CARE_PROVIDER_SITE_OTHER): Payer: Medicare Other

## 2011-03-13 ENCOUNTER — Encounter (HOSPITAL_BASED_OUTPATIENT_CLINIC_OR_DEPARTMENT_OTHER): Payer: Self-pay | Admitting: *Deleted

## 2011-03-13 ENCOUNTER — Observation Stay (HOSPITAL_BASED_OUTPATIENT_CLINIC_OR_DEPARTMENT_OTHER)
Admission: EM | Admit: 2011-03-13 | Discharge: 2011-03-14 | Disposition: A | Discharge status: Home / Self Care | Payer: Medicare Other | Source: Ambulatory Visit | Attending: Internal Medicine | Admitting: Internal Medicine

## 2011-03-13 DIAGNOSIS — J90 Pleural effusion, not elsewhere classified: Secondary | ICD-10-CM | POA: Diagnosis not present

## 2011-03-13 DIAGNOSIS — E8779 Other fluid overload: Secondary | ICD-10-CM | POA: Diagnosis not present

## 2011-03-13 DIAGNOSIS — E119 Type 2 diabetes mellitus without complications: Secondary | ICD-10-CM | POA: Diagnosis present

## 2011-03-13 DIAGNOSIS — I10 Essential (primary) hypertension: Secondary | ICD-10-CM | POA: Diagnosis not present

## 2011-03-13 DIAGNOSIS — I120 Hypertensive chronic kidney disease with stage 5 chronic kidney disease or end stage renal disease: Secondary | ICD-10-CM | POA: Insufficient documentation

## 2011-03-13 DIAGNOSIS — E8770 Fluid overload, unspecified: Secondary | ICD-10-CM | POA: Diagnosis present

## 2011-03-13 DIAGNOSIS — I289 Disease of pulmonary vessels, unspecified: Secondary | ICD-10-CM

## 2011-03-13 DIAGNOSIS — R0602 Shortness of breath: Secondary | ICD-10-CM | POA: Diagnosis not present

## 2011-03-13 DIAGNOSIS — Z940 Kidney transplant status: Secondary | ICD-10-CM | POA: Insufficient documentation

## 2011-03-13 DIAGNOSIS — N19 Unspecified kidney failure: Secondary | ICD-10-CM

## 2011-03-13 DIAGNOSIS — N186 End stage renal disease: Secondary | ICD-10-CM | POA: Diagnosis not present

## 2011-03-13 DIAGNOSIS — Z8673 Personal history of transient ischemic attack (TIA), and cerebral infarction without residual deficits: Secondary | ICD-10-CM | POA: Diagnosis not present

## 2011-03-13 DIAGNOSIS — F17200 Nicotine dependence, unspecified, uncomplicated: Secondary | ICD-10-CM | POA: Diagnosis not present

## 2011-03-13 DIAGNOSIS — I517 Cardiomegaly: Secondary | ICD-10-CM

## 2011-03-13 DIAGNOSIS — R918 Other nonspecific abnormal finding of lung field: Secondary | ICD-10-CM | POA: Diagnosis not present

## 2011-03-13 DIAGNOSIS — Z992 Dependence on renal dialysis: Secondary | ICD-10-CM | POA: Diagnosis not present

## 2011-03-13 DIAGNOSIS — Z91199 Patient's noncompliance with other medical treatment and regimen due to unspecified reason: Secondary | ICD-10-CM

## 2011-03-13 DIAGNOSIS — I5020 Unspecified systolic (congestive) heart failure: Secondary | ICD-10-CM | POA: Insufficient documentation

## 2011-03-13 DIAGNOSIS — Z9119 Patient's noncompliance with other medical treatment and regimen: Secondary | ICD-10-CM | POA: Diagnosis not present

## 2011-03-13 DIAGNOSIS — Z720 Tobacco use: Secondary | ICD-10-CM | POA: Diagnosis present

## 2011-03-13 DIAGNOSIS — D649 Anemia, unspecified: Secondary | ICD-10-CM | POA: Diagnosis not present

## 2011-03-13 HISTORY — DX: Anemia, unspecified: D64.9

## 2011-03-13 HISTORY — DX: Cerebral infarction, unspecified: I63.9

## 2011-03-13 LAB — CBC
HCT: 26.4 % — ABNORMAL LOW (ref 39.0–52.0)
MCHC: 32.2 g/dL (ref 30.0–36.0)
Platelets: 175 10*3/uL (ref 150–400)
RDW: 15.3 % (ref 11.5–15.5)
RDW: 15.8 % — ABNORMAL HIGH (ref 11.5–15.5)
WBC: 5.8 10*3/uL (ref 4.0–10.5)

## 2011-03-13 LAB — BASIC METABOLIC PANEL
CO2: 25 mEq/L (ref 19–32)
Calcium: 9.8 mg/dL (ref 8.4–10.5)
Chloride: 94 mEq/L — ABNORMAL LOW (ref 96–112)
Sodium: 140 mEq/L (ref 135–145)

## 2011-03-13 LAB — COMPREHENSIVE METABOLIC PANEL
Alkaline Phosphatase: 73 U/L (ref 39–117)
BUN: 78 mg/dL — ABNORMAL HIGH (ref 6–23)
Calcium: 10 mg/dL (ref 8.4–10.5)
Creatinine, Ser: 14.04 mg/dL — ABNORMAL HIGH (ref 0.50–1.35)
GFR calc Af Amer: 4 mL/min — ABNORMAL LOW (ref >90)
Glucose, Bld: 101 mg/dL — ABNORMAL HIGH (ref 70–99)
Potassium: 4.6 mEq/L (ref 3.5–5.1)
Total Protein: 6.9 g/dL (ref 6.0–8.3)

## 2011-03-13 LAB — DIFFERENTIAL
Basophils Absolute: 0 10*3/uL (ref 0.0–0.1)
Lymphocytes Relative: 19 % (ref 12–46)
Neutro Abs: 3.8 10*3/uL (ref 1.7–7.7)
Neutrophils Relative %: 66 % (ref 43–77)

## 2011-03-13 LAB — CARDIAC PANEL(CRET KIN+CKTOT+MB+TROPI)
CK, MB: 4.5 ng/mL — ABNORMAL HIGH (ref 0.3–4.0)
Relative Index: 1.6 (ref 0.0–2.5)
Total CK: 335 U/L — ABNORMAL HIGH (ref 7–232)
Total CK: 263 U/L — ABNORMAL HIGH (ref 7–232)

## 2011-03-13 LAB — PRO B NATRIURETIC PEPTIDE: Pro B Natriuretic peptide (BNP): >70000 pg/mL — ABNORMAL HIGH (ref 0–125)

## 2011-03-13 LAB — TSH: TSH: 0.487 u[IU]/mL (ref 0.350–4.500)

## 2011-03-13 MED ORDER — ALTEPLASE 2 MG IJ SOLR
2.0000 mg | Freq: Once | INTRAMUSCULAR | Status: AC | PRN
  Filled 2011-03-13: qty 2

## 2011-03-13 MED ORDER — PENTAFLUOROPROP-TETRAFLUOROETH EX AERO: 1.0000 "application " | INHALATION_SPRAY | CUTANEOUS | Status: DC | PRN

## 2011-03-13 MED ORDER — ZOLPIDEM TARTRATE 5 MG PO TABS: 5.0000 mg | ORAL_TABLET | Freq: Every evening | ORAL | Status: DC | PRN

## 2011-03-13 MED ORDER — SODIUM CHLORIDE 0.9 % IV SOLN: 100.0000 mL | INTRAVENOUS | Status: DC | PRN

## 2011-03-13 MED ORDER — ONDANSETRON HCL 4 MG/2ML IJ SOLN: 4.0000 mg | Freq: Four times a day (QID) | INTRAMUSCULAR | Status: DC | PRN

## 2011-03-13 MED ORDER — LIDOCAINE HCL (PF) 1 % IJ SOLN: 5.0000 mL | INTRAMUSCULAR | Status: DC | PRN

## 2011-03-13 MED ORDER — ASPIRIN EC 81 MG PO TBEC
81.0000 mg | DELAYED_RELEASE_TABLET | Freq: Every day | ORAL | Status: DC
  Administered 2011-03-13 – 2011-03-14 (×2): 81 mg via ORAL
  Filled 2011-03-13 (×2): qty 1

## 2011-03-13 MED ORDER — IPRATROPIUM BROMIDE 0.02 % IN SOLN
0.5000 mg | Freq: Once | RESPIRATORY_TRACT | Status: AC
  Administered 2011-03-13: 0.5 mg via RESPIRATORY_TRACT

## 2011-03-13 MED ORDER — CLONIDINE HCL 0.3 MG/24HR TD PTWK
0.3000 mg | MEDICATED_PATCH | TRANSDERMAL | Status: DC
  Administered 2011-03-13: 0.3 mg via TRANSDERMAL
  Filled 2011-03-13: qty 1

## 2011-03-13 MED ORDER — PARICALCITOL 5 MCG/ML IV SOLN
INTRAVENOUS | Status: AC
  Administered 2011-03-13: 9 ug via INTRAVENOUS
  Filled 2011-03-13: qty 2

## 2011-03-13 MED ORDER — RENA-VITE PO TABS
1.0000 | ORAL_TABLET | Freq: Every day | ORAL | Status: DC
  Administered 2011-03-13: 1 via ORAL
  Filled 2011-03-13 (×2): qty 1

## 2011-03-13 MED ORDER — AMLODIPINE BESYLATE 10 MG PO TABS
10.0000 mg | ORAL_TABLET | Freq: Every day | ORAL | Status: DC
  Administered 2011-03-13: 10 mg via ORAL
  Filled 2011-03-13 (×2): qty 1

## 2011-03-13 MED ORDER — SORBITOL 70 % SOLN
30.0000 mL | Status: DC | PRN
  Filled 2011-03-13: qty 30

## 2011-03-13 MED ORDER — HEPARIN SODIUM (PORCINE) 1000 UNIT/ML DIALYSIS
1000.0000 [IU] | INTRAMUSCULAR | Status: DC | PRN
  Filled 2011-03-13: qty 1

## 2011-03-13 MED ORDER — LEVALBUTEROL HCL 0.63 MG/3ML IN NEBU
0.6300 mg | INHALATION_SOLUTION | Freq: Four times a day (QID) | RESPIRATORY_TRACT | Status: DC | PRN
  Filled 2011-03-13: qty 3

## 2011-03-13 MED ORDER — HEPARIN SODIUM (PORCINE) 1000 UNIT/ML DIALYSIS
2200.0000 [IU] | INTRAMUSCULAR | Status: DC | PRN
  Administered 2011-03-13: 2200 [IU] via INTRAVENOUS_CENTRAL
  Filled 2011-03-13: qty 3

## 2011-03-13 MED ORDER — ACETAMINOPHEN 325 MG PO TABS: 650.0000 mg | ORAL_TABLET | Freq: Four times a day (QID) | ORAL | Status: DC | PRN

## 2011-03-13 MED ORDER — ALBUTEROL SULFATE (5 MG/ML) 0.5% IN NEBU
5.0000 mg | INHALATION_SOLUTION | Freq: Once | RESPIRATORY_TRACT | Status: AC
  Administered 2011-03-13: 5 mg via RESPIRATORY_TRACT

## 2011-03-13 MED ORDER — SODIUM CHLORIDE 0.9 % IJ SOLN: 3.0000 mL | Freq: Two times a day (BID) | INTRAMUSCULAR | Status: DC

## 2011-03-13 MED ORDER — CINACALCET HCL 30 MG PO TABS
30.0000 mg | ORAL_TABLET | Freq: Every day | ORAL | Status: DC
  Administered 2011-03-13: 30 mg via ORAL
  Filled 2011-03-13 (×2): qty 1

## 2011-03-13 MED ORDER — ONDANSETRON HCL 4 MG PO TABS: 4.0000 mg | ORAL_TABLET | Freq: Four times a day (QID) | ORAL | Status: DC | PRN

## 2011-03-13 MED ORDER — SODIUM CHLORIDE 0.9 % IJ SOLN
3.0000 mL | Freq: Two times a day (BID) | INTRAMUSCULAR | Status: DC
  Administered 2011-03-13 – 2011-03-14 (×3): 3 mL via INTRAVENOUS

## 2011-03-13 MED ORDER — DARBEPOETIN ALFA-POLYSORBATE 25 MCG/0.42ML IJ SOLN
INTRAMUSCULAR | Status: AC
  Administered 2011-03-13: 25 ug via INTRAVENOUS
  Filled 2011-03-13: qty 0.42

## 2011-03-13 MED ORDER — ALBUTEROL SULFATE (5 MG/ML) 0.5% IN NEBU
INHALATION_SOLUTION | RESPIRATORY_TRACT | Status: AC
  Administered 2011-03-13: 5 mg via RESPIRATORY_TRACT
  Filled 2011-03-13: qty 1

## 2011-03-13 MED ORDER — HEPARIN SODIUM (PORCINE) 1000 UNIT/ML DIALYSIS
2200.0000 [IU] | INTRAMUSCULAR | Status: DC | PRN
  Administered 2011-03-14: 2200 [IU] via INTRAVENOUS_CENTRAL
  Filled 2011-03-13: qty 3

## 2011-03-13 MED ORDER — LABETALOL HCL 300 MG PO TABS
150.0000 mg | ORAL_TABLET | Freq: Two times a day (BID) | ORAL | Status: DC
  Administered 2011-03-13 – 2011-03-14 (×3): 150 mg via ORAL
  Filled 2011-03-13 (×4): qty 0.5

## 2011-03-13 MED ORDER — DOCUSATE SODIUM 283 MG RE ENEM
1.0000 | ENEMA | RECTAL | Status: DC | PRN
  Filled 2011-03-13: qty 1

## 2011-03-13 MED ORDER — CALCIUM CARBONATE ANTACID 500 MG PO CHEW
2.0000 | CHEWABLE_TABLET | Freq: Three times a day (TID) | ORAL | Status: DC
  Administered 2011-03-13 – 2011-03-14 (×2): 400 mg via ORAL
  Filled 2011-03-13 (×5): qty 2

## 2011-03-13 MED ORDER — ACETAMINOPHEN 650 MG RE SUPP: 650.0000 mg | Freq: Four times a day (QID) | RECTAL | Status: DC | PRN

## 2011-03-13 MED ORDER — IPRATROPIUM BROMIDE 0.02 % IN SOLN
RESPIRATORY_TRACT | Status: AC
  Administered 2011-03-13: 0.5 mg via RESPIRATORY_TRACT
  Filled 2011-03-13: qty 2.5

## 2011-03-13 MED ORDER — CALCIUM CARBONATE 1250 MG/5ML PO SUSP
500.0000 mg | Freq: Four times a day (QID) | ORAL | Status: DC | PRN
  Filled 2011-03-13: qty 5

## 2011-03-13 MED ORDER — NEPRO/CARBSTEADY PO LIQD: 237.0000 mL | Freq: Three times a day (TID) | ORAL | Status: DC | PRN

## 2011-03-13 MED ORDER — CAMPHOR-MENTHOL 0.5-0.5 % EX LOTN
1.0000 "application " | TOPICAL_LOTION | Freq: Three times a day (TID) | CUTANEOUS | Status: DC | PRN
  Filled 2011-03-13: qty 222

## 2011-03-13 MED ORDER — DARBEPOETIN ALFA-POLYSORBATE 25 MCG/0.42ML IJ SOLN
25.0000 ug | INTRAMUSCULAR | Status: DC
  Administered 2011-03-13: 25 ug via INTRAVENOUS
  Filled 2011-03-13: qty 0.42

## 2011-03-13 MED ORDER — PARICALCITOL 5 MCG/ML IV SOLN: 2.0000 ug | INTRAVENOUS | Status: DC

## 2011-03-13 MED ORDER — LABETALOL HCL 5 MG/ML IV SOLN: 10.0000 mg | INTRAVENOUS | Status: DC | PRN

## 2011-03-13 MED ORDER — HYDROXYZINE HCL 25 MG PO TABS
25.0000 mg | ORAL_TABLET | Freq: Three times a day (TID) | ORAL | Status: DC | PRN
  Filled 2011-03-13: qty 1

## 2011-03-13 MED ORDER — PARICALCITOL 5 MCG/ML IV SOLN
9.0000 ug | INTRAVENOUS | Status: DC
  Administered 2011-03-13: 9 ug via INTRAVENOUS
  Filled 2011-03-13: qty 1.8

## 2011-03-13 MED ORDER — LIDOCAINE-PRILOCAINE 2.5-2.5 % EX CREA
1.0000 "application " | TOPICAL_CREAM | CUTANEOUS | Status: DC | PRN
  Filled 2011-03-13: qty 5

## 2011-03-13 NOTE — ED Provider Notes (Addendum)
History  This chart was scribed for Nathaniel Zuver Alfonso Patten, MD by Jenne Campus. This patient was seen in room MH01/MH01 and the patient's care was started at 12:48AM.  CSN: [id number]  Arrival date & time 03/13/11  0024   First MD Initiated Contact with Patient 03/13/11 0046      Chief Complaint  Patient presents with  . Respiratory Distress    The history is provided by the patient. No language interpreter was used.    Nathaniel White is a 51 y.o. male who presents to the Emergency Department complaining of 2 days of gradual onset, gradually worsening, constant SOB with associated chest heaviness and productive cough of clear phlegm. He reports that the SOB is worse with laying down. The SOB is unchanged with exertion. He has not taken any medications PTA to improve symptoms. Pt is a dialysis pt and states that the symptoms he is experiencing now are similar to the symptoms he experiences when he needs a dialysis treatment. He reports that his last dialysis treatment was Saturday and that his next treatment is today at noon. He denies emesis, diarrhea, fever and chills as associated symptoms.  He denies having a h/o asthma. He also has a h/o HTN which he takes regular medications for. Pt is a smoker but denies alcohol use.  Pt gets dialysis done at [facility].    Past Medical History  Diagnosis Date  . Hypertension   . Renal insufficiency     Past Surgical History  Procedure Date  . Kidney transplant     2011 rejected kidney 2012 back on dialysis    No family history on file.  History  Substance Use Topics  . Smoking status: Former Research scientist (life sciences)  . Smokeless tobacco: Not on file  . Alcohol Use: No      Review of Systems  Constitutional: Negative for fever, chills and diaphoresis.  HENT: Negative for congestion, sore throat and neck pain.   Eyes: Negative for pain.  Respiratory: Positive for cough and shortness of breath.   Gastrointestinal: Negative for nausea,  vomiting, abdominal pain and diarrhea.  Genitourinary: Negative for dysuria and hematuria.  Musculoskeletal: Negative for back pain.  Skin: Negative for rash.  Neurological: Negative for seizures and headaches.    Allergies  Codeine  Home Medications   Current Outpatient Rx  Name Route Sig Dispense Refill  . ASPIRIN EC 81 MG PO TBEC Oral Take 81 mg by mouth daily.    Marland Kitchen CLONIDINE HCL 0.3 MG/24HR TD PTWK Transdermal Place 1 patch onto the skin once a week. Change on Tuesday    . GUAIFENESIN 100 MG/5ML PO LIQD Oral Take 200 mg by mouth 3 (three) times daily as needed. For cough    . LABETALOL HCL 300 MG PO TABS Oral Take 150 mg by mouth 2 (two) times daily.      Triage Vitals: BP 183/103  Pulse 90  Temp(Src) 98.2 F (36.8 C) (Oral)  Resp 20  Ht 5\' 9"  (1.753 m)  Wt 169 lb 12.1 oz (77 kg)  BMI 25.07 kg/m2  SpO2 92%  Physical Exam  Nursing note and vitals reviewed. Constitutional: He is oriented to person, place, and time. He appears well-developed and well-nourished.  HENT:  Head: Normocephalic and atraumatic.       Moist mucous membranes  Eyes: EOM are normal. Pupils are equal, round, and reactive to light.  Neck: Normal range of motion. Neck supple. No JVD present. No tracheal deviation (Trachea is midline) present.  Cardiovascular: Normal rate and regular rhythm.   Pulmonary/Chest: Effort normal. He has rales (right lung fields).       Rhonchi in mid lung fields  Abdominal: Soft. Bowel sounds are normal.  Musculoskeletal: Normal range of motion. He exhibits no edema.  Lymphadenopathy:    He has no cervical adenopathy.  Neurological: He is alert and oriented to person, place, and time.  Skin: Skin is warm and dry.  Psychiatric: He has a normal mood and affect. His behavior is normal.    ED Course  Procedures (including critical care time)  DIAGNOSTIC STUDIES: Oxygen Saturation is 92% on room air, low by my interpretation.    COORDINATION OF CARE: 12:52Pm-Pt  states that his feeling better after the breathing treatment. Dicussed treatment plan with pt and pt agreed to plan.   Labs Reviewed - No data to display No results found.   No diagnosis found.    MDM    Date: 03/13/2011  Rate: 96  Rhythm: normal sinus rhythm  QRS Axis: normal  Intervals: long qtc  ST/T Wave abnormalities: ST depressions inferiorly and laterally  Conduction Disutrbances:none  Narrative Interpretation:   Old EKG Reviewed: changes noted   I personally performed the services described in this documentation, which was scribed in my presence. The recorded information has been reviewed and considered.         Carlisle Beers, MD 03/13/11 0246  Nathaniel Sawyer Alfonso Patten, MD 03/13/11 [phone]

## 2011-03-13 NOTE — Consult Note (Signed)
[facility] Renal Consultation Note  Indication for Consultation:  Management of ESRD/hemodialysis; anemia, hypertension/volume and secondary hyperparathyroidism  HPI: Nathaniel White is a 51 y.o. male with ESRD secondary to HTN, initially started on HD in 03/2003 and now s/p failed kidney transplant (2011) on chronic HD support every TTS at [facility] kidney center who was transferred here after presenting to the [employer] secondary to worsening shortness of breath x 24 hrs. A CXR on adm revealed persisting right pleural effusion, pulmonary vascular congestion, and possible right lung consolidation. He is therefore being admitted for further evaluation and treatment. PMH also includes systolic heart failure (E.F. 43% in 2006), CVA, HTN, Anemia, SHPT, and according to his outpatient HD unit, medical noncompliance with his treatment regimen in which he signs off early or comes in late for his treatment making fluid removal difficult. He did however, leave out only 0.4 kg above his last treatment on Saturday and will need a new eDW at the time of discharge. Renal Consult for ongoing RRT and plans for HD today and tomorrow.  Dialysis Orders: Center: [facility] on TTS Dr. Mercy Moore EDW 77kg HD Bath 2K/2.25 Ca Time 3:45 Heparin 2,200. Access LUA AVF BFR 450 DFR A1.5    Zemplar 2 mcg IV/HD Epogen 10,000 Units IV/HD  Venofer  none Other   Past Medical History  Diagnosis Date  . Hypertension   . Renal insufficiency   . Anemia   . Stroke    Past Surgical History  Procedure Date  . Kidney transplant     2011 rejected kidney 2012 back on dialysis  . Av fistula placement    History reviewed. No pertinent family history.  reports that he has quit smoking. He does not have any smokeless tobacco history on file. He reports that he does not drink alcohol or use illicit drugs. Allergies  Allergen Reactions  . Codeine Shortness Of Breath   Prior to Admission medications     Medication Sig Start Date End Date Taking? Authorizing Provider  aspirin EC 81 MG tablet Take 81 mg by mouth daily.   Yes Historical Provider, MD  cinacalcet (SENSIPAR) 30 MG tablet Take 30 mg by mouth daily.   Yes Historical Provider, MD  cloNIDine (CATAPRES - DOSED IN MG/24 HR) 0.3 mg/24hr Place 1 patch onto the skin once a week. Change on Tuesday   Yes Historical Provider, MD  guaiFENesin (ROBITUSSIN) 100 MG/5ML liquid Take 200 mg by mouth 3 (three) times daily as needed. For cough   Yes Historical Provider, MD  labetalol (NORMODYNE) 300 MG tablet Take 150 mg by mouth 2 (two) times daily.   Yes Historical Provider, MD   I have reviewed the patient's current medications. Results for orders placed during the hospital encounter of 03/13/11 (from the past 48 hour(s))  CBC     Status: Abnormal   Collection Time   03/13/11  1:19 AM      Component Value Range Comment   WBC 5.8  4.0 - 10.5 (K/uL)    RBC 3.06 (*) 4.22 - 5.81 (MIL/uL)    Hemoglobin 9.0 (*) 13.0 - 17.0 (g/dL)    HCT 28.1 (*) 39.0 - 52.0 (%)    MCV 91.8  78.0 - 100.0 (fL)    MCH 29.4  26.0 - 34.0 (pg)    MCHC 32.0  30.0 - 36.0 (g/dL)    RDW 15.3  11.5 - 15.5 (%)    Platelets 175  150 - 400 (K/uL)  DIFFERENTIAL     Status: Abnormal   Collection Time   03/13/11  1:19 AM      Component Value Range Comment   Neutrophils Relative 66  43 - 77 (%)    Neutro Abs 3.8  1.7 - 7.7 (K/uL)    Lymphocytes Relative 19  12 - 46 (%)    Lymphs Abs 1.1  0.7 - 4.0 (K/uL)    Monocytes Relative 9  3 - 12 (%)    Monocytes Absolute 0.5  0.1 - 1.0 (K/uL)    Eosinophils Relative 6 (*) 0 - 5 (%)    Eosinophils Absolute 0.4  0.0 - 0.7 (K/uL)    Basophils Relative 1  0 - 1 (%)    Basophils Absolute 0.0  0.0 - 0.1 (K/uL)   BASIC METABOLIC PANEL     Status: Abnormal   Collection Time   03/13/11  1:19 AM      Component Value Range Comment   Sodium 140  135 - 145 (mEq/L)    Potassium 4.5  3.5 - 5.1 (mEq/L)    Chloride 94 (*) 96 - 112 (mEq/L)    CO2 25   19 - 32 (mEq/L)    Glucose, Bld 97  70 - 99 (mg/dL)    BUN 72 (*) 6 - 23 (mg/dL)    Creatinine, Ser 13.80 (*) 0.50 - 1.35 (mg/dL)    Calcium 9.8  8.4 - 10.5 (mg/dL)    GFR calc non Af Amer 4 (*) >90 (mL/min)    GFR calc Af Amer 4 (*) >90 (mL/min)   CARDIAC PANEL(CRET KIN+CKTOT+MB+TROPI)     Status: Abnormal   Collection Time   03/13/11  1:19 AM      Component Value Range Comment   Total CK 335 (*) 7 - 232 (U/L)    CK, MB 4.5 (*) 0.3 - 4.0 (ng/mL)    Troponin I <0.30  <0.30 (ng/mL)    Relative Index 1.3  0.0 - 2.5    PRO B NATRIURETIC PEPTIDE     Status: Abnormal   Collection Time   03/13/11  1:20 AM      Component Value Range Comment   Pro B Natriuretic peptide (BNP) >70000.0 (*) 0 - 125 (pg/mL) RESULT CONFIRMED BY AUTOMATED DILUTION  MRSA PCR SCREENING     Status: Normal   Collection Time   03/13/11  5:15 AM      Component Value Range Comment   MRSA by PCR NEGATIVE  NEGATIVE     Telemetry: SR with depressed ST, rate 80   ROS: as noted HPI; including occasional clear productive cough and orthopnea; otherwise negative   Physical Exam: Filed Vitals:   03/13/11 0808  BP: 184/109  Pulse: 95  Temp: 97.8 F (36.6 C)  Resp: 21     General: comfortable HEENT: atraumatic, normocephalic Eyes: PERRL Neck: supple Heart: RRR, 2/6 SEM Lungs: coarse with scattered rhonchi r side, cleared with cough, no audible rales or wheezes Abdomen: soft, NT/ND, +BS Extremities: no ankle edema Skin: intact Neuro: alert, O x 3 Dialysis Access: LUA AVF, +T/B  Assessment/Plan: 1. Volume Overload- CXR on adm; persistent right pleural effusion; pulmonary vascular congestion; PMH systolic heart failure; HD today and tomorrow; schedule f/u CXR after HD tomorrow 2. Possible Pneumonia- pleural effusion with atelectasis vs. consolidation right lung base; afebrile, doubt pneumonia; no antibiotics; re-evaluate with repeat CXR as noted above 3. Tobacco abuse- encouraged cessation   4. ESRD -  S/p failed  renal transplant on  HD TTS; last tx on Saturday and left out 0.4 above eDW; will schedule dialysis today increasing tx time 4hrs (extra 15 min); and a short tx tomorrow 3 hrs to facilitate fluid removal  5. Medical Noncompliance- history of signing off HD treatments early or coming in early making fluid removal difficult per outpatient HD unit staff; also having difficulty with BP meds (off Catapress TTS-3 patches x 1 month now) 6. Hypertension/volume  - BP elevated (184/109); continue outpatient meds including Labetalol 300mg  BID (previously on 600mg  BID), (as noted above, off Catapress x 1 month but resumed); will also add Amlodipine 10mg  qhs; Max UF with each treatment 7. Anemia  - Hgb 9.0; previously on 8,000 epogen with recent dose increase 10,000; no IV iron outpatient setting; give Aranesp 15mcg and follow trend 8. Metabolic bone disease- same binders (taking Tums 750mg - 2 w/meals TID (give 500mg /2 tabs TID) , Sensipar 30mg  qd, and Zemplar (dose recently decreased to 2 mcg-orders adjusted); follow ca/phos 8. DM- steroid induced; diet controlled and no longer on insulin   9. Nutrition - appetite good 10. Disposition- per primary services; same outpt HD treatment regimen with new eDW  Marni Griffon, FNP-C [place] Pager [phone] 03/13/2011, 8:31 AM   Attending Nephrologist: Dr. Erling Cruz   Admitted with shortness of breath and elevated BP.  For usual treatment day today.Anticipate needs adjustment in EDW to optimize BP and volume status.  Agree with plan and evaluation as articulated above. Latroya Ng C

## 2011-03-13 NOTE — Progress Notes (Signed)
Subjective: Sitting up in bed with no complaints. He is no longer dyspneic. No cough or chest pain.   Objective: Blood pressure 164/83, pulse 95, temperature 97.8 F (36.6 C), temperature source Oral, resp. rate 17, height 5\' 9"  (1.753 m), weight 75.5 kg (166 lb 7.2 oz), SpO2 100.00%. Weight change:   Intake/Output Summary (Last 24 hours) at 03/13/11 1728 Last data filed at 03/13/11 1549  Gross per 24 hour  Intake    480 ml  Output   3221 ml  Net  -2741 ml    Physical Exam: General appearance: alert, cooperative and no distress Throat: lips, mucosa, and tongue normal; teeth and gums normal Lungs: clear to auscultation bilaterally, on room air with O2 sat at 100% Heart: regular rate and rhythm, S1, S2 normal, no murmur, click, rub or gallop Abdomen: soft, non-tender; bowel sounds normal; no masses,  no organomegaly Extremities: extremities normal, atraumatic, no cyanosis or edema  Lab Results:  [facility] 03/13/11 1028 03/13/11 0119  NA 140 140  K 4.6 4.5  CL 96 94*  CO2 25 25  GLUCOSE 101* 97  BUN 78* 72*  CREATININE 14.04* 13.80*  CALCIUM 10.0 9.8  MG -- --  PHOS 7.3* --    Basename 03/13/11 1028  AST 24  ALT 14  ALKPHOS 73  BILITOT 0.3  PROT 6.9  ALBUMIN 3.1*   No results found for this basename: LIPASE:2,AMYLASE:2 in the last 72 hours  Basename 03/13/11 1028 03/13/11 0119  WBC 4.8 5.8  NEUTROABS -- 3.8  HGB 8.5* 9.0*  HCT 26.4* 28.1*  MCV 90.7 91.8  PLT 153 175    Basename 03/13/11 1029 03/13/11 0119  CKTOTAL 263* 335*  CKMB 4.3* 4.5*  CKMBINDEX -- --  TROPONINI <0.30 <0.30   No components found with this basename: POCBNP:3 No results found for this basename: DDIMER:2 in the last 72 hours No results found for this basename: HGBA1C:2 in the last 72 hours No results found for this basename: CHOL:2,HDL:2,LDLCALC:2,TRIG:2,CHOLHDL:2,LDLDIRECT:2 in the last 72 hours  Basename 03/13/11 1028  TSH 0.487  T4TOTAL --  T3FREE --  THYROIDAB --   No  results found for this basename: VITAMINB12:2,FOLATE:2,FERRITIN:2,TIBC:2,IRON:2,RETICCTPCT:2 in the last 72 hours  Micro Results: Recent Results (from the past 240 hour(s))  MRSA PCR SCREENING     Status: Normal   Collection Time   03/13/11  5:15 AM      Component Value Range Status Comment   MRSA by PCR NEGATIVE  NEGATIVE  Final     Studies/Results: Dg Chest 2 View  03/13/2011  *RADIOLOGY REPORT*  Clinical Data: Shortness of breath, orthopnea, history hypertension, renal transplant, smoking  CHEST - 2 VIEW  Comparison: 02/05/2011  Findings: Enlargement of cardiac silhouette with pulmonary vascular congestion. Persistent right pleural effusion and basilar atelectasis versus consolidation. Left lung clear. No pneumothorax or acute osseous finding.  IMPRESSION: Enlargement of cardiac silhouette with pulmonary vascular congestion. Persistent right pleural effusion with atelectasis versus consolidation right lung base. No interval change.  Original Report Authenticated By: Burnetta Sabin, M.D.    Medications: Scheduled Meds:    . albuterol  5 mg Nebulization Once  . amLODipine  10 mg Oral QHS  . aspirin EC  81 mg Oral Daily  . calcium carbonate  2 tablet Oral TID WC  . cinacalcet  30 mg Oral Q supper  . cloNIDine  0.3 mg Transdermal Weekly  . darbepoetin (ARANESP) injection - DIALYSIS  25 mcg Intravenous Q Tue-HD  . ipratropium  0.5  mg Nebulization Once  . labetalol  150 mg Oral BID  . multivitamin  1 tablet Oral QHS  . paricalcitol  2 mcg Intravenous Q T,Th,Sa-HD  . sodium chloride  3 mL Intravenous Q12H  . sodium chloride  3 mL Intravenous Q12H  . DISCONTD: paricalcitol  9 mcg Intravenous Q T,Th,Sa-HD   Continuous Infusions:  PRN Meds:.sodium chloride, sodium chloride, acetaminophen, acetaminophen, alteplase, calcium carbonate (dosed in mg elemental calcium), camphor-menthol, docusate sodium, feeding supplement (NEPRO CARB STEADY), heparin, heparin, heparin, hydrOXYzine, labetalol,  levalbuterol, lidocaine, lidocaine-prilocaine, ondansetron (ZOFRAN) IV, ondansetron, pentafluoroprop-tetrafluoroeth, sorbitol, zolpidem, DISCONTD: ondansetron (ZOFRAN) IV, DISCONTD: ondansetron  Assessment/Plan: Principal Problem:  *Fluid overload/Shortness of breath/ ESRD (end stage renal disease) on dialysis S/p dialysis today with plan for another treatment tomorrow. I have spoken with Marni Griffon, FNP who states that she would like to get him down to his dry weight as he often purposely shortens his dialysis treatments and has been difficult to manage.   Active Problems:   HTN (hypertension) Again, noncompliant with medications; per above NP note he has been out of his Catapres patches for 1 month now. BP now better controlled. Cont current management.    Pleural effusion Chronic and currently not interfering with his respiratory status.  F/u CXR after dialysis tomorrow.    Anemia On Epo per nephro   Tobacco abuse Tobacco counselor requested.    History of nonadherence to medical treatment    LOS: 0 days   [facility] [phone] 03/13/2011, 5:28 PM

## 2011-03-13 NOTE — Progress Notes (Signed)
Hemodialysis Note:  Presented with complaints of dyspnea.  Volume overload. Reports left HD 15 min early last treatment. AV access left arm, 4000cc goal, BP up 184/109, stable hemodynamics, CXR shows some pulmonary congestion and r pleural effusion. Nathaniel White C

## 2011-03-13 NOTE — ED Notes (Signed)
Patient requested to be placed on oxygen. Placed on o2 at 2L. O2 levels went from 89% to 94% after being placed on [place].

## 2011-03-13 NOTE — Progress Notes (Signed)
Notified MD patient has arrived to unit from [facility]

## 2011-03-13 NOTE — H&P (Addendum)
Nathaniel White is an 51 y.o. male.   Chief Complaint: Shortness of breath. HPI: 51 year-old male with known history of ESRD on hemodialysis on Tuesday Thursday and Saturdays, hypertension, previous history of CVA, and history of systolic heart failure per the 2-D echo in 2004 presented to the [facility] [place] with shortness of breath worsening over the last 24 hours. Patient states he did not miss his dialysis as scheduled on Saturday. Patient denies any chest pain fever chills. Did have some cough nonproductive. Denies any nausea vomiting abdominal pain dizziness or loss of consciousness. In the ER patient had a chest x-ray done which shows right-sided pleural effusion. ER physician had called the nephrologist Dr. Justin Mend who had this time advised to admit patient hospitalist. Patient on my exam denies any chest pain and is not in any acute distress.  Past Medical History  Diagnosis Date  . Hypertension   . Renal insufficiency   . Anemia   . Stroke     Past Surgical History  Procedure Date  . Kidney transplant     2011 rejected kidney 2012 back on dialysis  . Av fistula placement     History reviewed. No pertinent family history. Social History:  reports that he has quit smoking. He does not have any smokeless tobacco history on file. He reports that he does not drink alcohol or use illicit drugs.  Allergies:  Allergies  Allergen Reactions  . Codeine Shortness Of Breath    Medications Prior to Admission  Medication Dose Route Frequency Provider Last Rate Last Dose  . acetaminophen (TYLENOL) tablet 650 mg  650 mg Oral Q6H PRN Rise Patience, MD       Or  . acetaminophen (TYLENOL) suppository 650 mg  650 mg Rectal Q6H PRN Rise Patience, MD      . albuterol (PROVENTIL) (5 MG/ML) 0.5% nebulizer solution 5 mg  5 mg Nebulization Once April K Palumbo-Rasch, MD   5 mg at 03/13/11 0043  . aspirin EC tablet 81 mg  81 mg Oral Daily Rise Patience, MD      . cloNIDine  (CATAPRES - Dosed in mg/24 hr) patch 0.3 mg  0.3 mg Transdermal Weekly Rise Patience, MD      . ipratropium (ATROVENT) nebulizer solution 0.5 mg  0.5 mg Nebulization Once April K Palumbo-Rasch, MD   0.5 mg at 03/13/11 0042  . labetalol (NORMODYNE) tablet 150 mg  150 mg Oral BID Rise Patience, MD      . labetalol (NORMODYNE,TRANDATE) injection 10 mg  10 mg Intravenous Q2H PRN Rise Patience, MD      . levalbuterol Penne Lash) nebulizer solution 0.63 mg  0.63 mg Nebulization Q6H PRN Rise Patience, MD      . ondansetron [facility]) tablet 4 mg  4 mg Oral Q6H PRN Rise Patience, MD       Or  . ondansetron (ZOFRAN) injection 4 mg  4 mg Intravenous Q6H PRN Rise Patience, MD      . sodium chloride 0.9 % injection 3 mL  3 mL Intravenous Q12H Rise Patience, MD      . sodium chloride 0.9 % injection 3 mL  3 mL Intravenous Q12H Rise Patience, MD       Medications Prior to Admission  Medication Sig Dispense Refill  . aspirin EC 81 MG tablet Take 81 mg by mouth daily.      . cloNIDine (CATAPRES - DOSED IN  MG/24 HR) 0.3 mg/24hr Place 1 patch onto the skin once a week. Change on Tuesday      . guaiFENesin (ROBITUSSIN) 100 MG/5ML liquid Take 200 mg by mouth 3 (three) times daily as needed. For cough      . labetalol (NORMODYNE) 300 MG tablet Take 150 mg by mouth 2 (two) times daily.        Results for orders placed during the hospital encounter of 03/13/11 (from the past 48 hour(s))  CBC     Status: Abnormal   Collection Time   03/13/11  1:19 AM      Component Value Range Comment   WBC 5.8  4.0 - 10.5 (K/uL)    RBC 3.06 (*) 4.22 - 5.81 (MIL/uL)    Hemoglobin 9.0 (*) 13.0 - 17.0 (g/dL)    HCT 28.1 (*) 39.0 - 52.0 (%)    MCV 91.8  78.0 - 100.0 (fL)    MCH 29.4  26.0 - 34.0 (pg)    MCHC 32.0  30.0 - 36.0 (g/dL)    RDW 15.3  11.5 - 15.5 (%)    Platelets 175  150 - 400 (K/uL)   DIFFERENTIAL     Status: Abnormal   Collection Time   03/13/11  1:19 AM       Component Value Range Comment   Neutrophils Relative 66  43 - 77 (%)    Neutro Abs 3.8  1.7 - 7.7 (K/uL)    Lymphocytes Relative 19  12 - 46 (%)    Lymphs Abs 1.1  0.7 - 4.0 (K/uL)    Monocytes Relative 9  3 - 12 (%)    Monocytes Absolute 0.5  0.1 - 1.0 (K/uL)    Eosinophils Relative 6 (*) 0 - 5 (%)    Eosinophils Absolute 0.4  0.0 - 0.7 (K/uL)    Basophils Relative 1  0 - 1 (%)    Basophils Absolute 0.0  0.0 - 0.1 (K/uL)   BASIC METABOLIC PANEL     Status: Abnormal   Collection Time   03/13/11  1:19 AM      Component Value Range Comment   Sodium 140  135 - 145 (mEq/L)    Potassium 4.5  3.5 - 5.1 (mEq/L)    Chloride 94 (*) 96 - 112 (mEq/L)    CO2 25  19 - 32 (mEq/L)    Glucose, Bld 97  70 - 99 (mg/dL)    BUN 72 (*) 6 - 23 (mg/dL)    Creatinine, Ser 13.80 (*) 0.50 - 1.35 (mg/dL)    Calcium 9.8  8.4 - 10.5 (mg/dL)    GFR calc non Af Amer 4 (*) >90 (mL/min)    GFR calc Af Amer 4 (*) >90 (mL/min)   CARDIAC PANEL(CRET KIN+CKTOT+MB+TROPI)     Status: Abnormal   Collection Time   03/13/11  1:19 AM      Component Value Range Comment   Total CK 335 (*) 7 - 232 (U/L)    CK, MB 4.5 (*) 0.3 - 4.0 (ng/mL)    Troponin I <0.30  <0.30 (ng/mL)    Relative Index 1.3  0.0 - 2.5    PRO B NATRIURETIC PEPTIDE     Status: Abnormal   Collection Time   03/13/11  1:20 AM      Component Value Range Comment   Pro B Natriuretic peptide (BNP) >70000.0 (*) 0 - 125 (pg/mL) RESULT CONFIRMED BY AUTOMATED DILUTION   Dg Chest 2 View  03/13/2011  *RADIOLOGY  REPORT*  Clinical Data: Shortness of breath, orthopnea, history hypertension, renal transplant, smoking  CHEST - 2 VIEW  Comparison: 02/05/2011  Findings: Enlargement of cardiac silhouette with pulmonary vascular congestion. Persistent right pleural effusion and basilar atelectasis versus consolidation. Left lung clear. No pneumothorax or acute osseous finding.  IMPRESSION: Enlargement of cardiac silhouette with pulmonary vascular congestion. Persistent right pleural  effusion with atelectasis versus consolidation right lung base. No interval change.  Original Report Authenticated By: Burnetta Sabin, M.D.    Review of Systems  Constitutional: Negative.   HENT: Negative.   Eyes: Negative.   Respiratory: Positive for cough and shortness of breath.   Cardiovascular: Negative.   Gastrointestinal: Negative.   Genitourinary: Negative.   Musculoskeletal: Negative.   Skin: Negative.   Neurological: Negative.   Endo/Heme/Allergies: Negative.   Psychiatric/Behavioral: Negative.     Blood pressure 170/97, pulse 90, temperature 98.1 F (36.7 C), temperature source Oral, resp. rate 22, height 5\' 9"  (1.753 m), weight 78.7 kg (173 lb 8 oz), SpO2 95.00%. Physical Exam  Constitutional: He is oriented to person, place, and time. He appears well-developed and well-nourished. No distress.  HENT:  Head: Normocephalic and atraumatic.  Right Ear: External ear normal.  Left Ear: External ear normal.  Eyes: Conjunctivae are normal. Pupils are equal, round, and reactive to light. Right eye exhibits no discharge. Left eye exhibits no discharge. No scleral icterus.  Neck: Normal range of motion. Neck supple.  Cardiovascular:       Sinus tachycardia.  Respiratory: Effort normal. No respiratory distress. He has no wheezes. He has no rales.  GI: Soft. Bowel sounds are normal. He exhibits no distension. There is no tenderness. There is no rebound.  Musculoskeletal: Normal range of motion. He exhibits no edema and no tenderness.  Neurological: He is alert and oriented to person, place, and time.       Moves upper and lower extremities.  Skin: Skin is warm and dry. He is not diaphoretic.  Psychiatric: His behavior is normal.     Assessment/Plan #1. Shortness of breath in a patient with known history of ESRD on hemodialysis on Tuesdays Thursdays and Saturdays - I think patient's shortness of breath will improve with dialysis. [facility] consult nephrology for dialysis. #2.  Persistent right-sided pleural effusion - reviewing his chart, patient was admitted last year and also had similar chest x-ray appearance with pleural fluid. If patient's shortness of breath does not improve with dialysis then may need thoracentesis. Presently patient is afebrile and has no leukocytosis or any features to suggest pneumonia. #3. Uncontrolled hypertension - continue his present medications including clonidine and labetalol. At this time I have kept patient on when necessary IV labetalol for systolic blood pressure more than 160. I think his blood pressures will improve with dialysis. #4. Anemia probably related to his chronic kidney disease - follow CBC. #5. History of CVA. #6. Ongoing tobacco abuse - I have advised patient against tobacco abuse.  CODE STATUS - full code.  Rise Patience 03/13/2011, 6:48 AM

## 2011-03-13 NOTE — ED Notes (Signed)
Pt was walked around the department on RA 21%. Pt oxygen saturation dropped to 88% and HR went up to 123 bpm during walk. Pt was asked if he felt [facility] on exersion but denied [facility] and stated he wanted to go back to the room and go home. Pt appeared to be out of breath on exersion during walk.

## 2011-03-13 NOTE — ED Notes (Signed)
Pt reports SOB since Sunday. Unchanged with exertion. Worsens with lying flat.

## 2011-03-13 NOTE — ED Notes (Signed)
[facility] arrived for transport. No change in pt condition.

## 2011-03-14 ENCOUNTER — Inpatient Hospital Stay (HOSPITAL_COMMUNITY): Payer: Medicare Other

## 2011-03-14 DIAGNOSIS — I517 Cardiomegaly: Secondary | ICD-10-CM | POA: Diagnosis not present

## 2011-03-14 DIAGNOSIS — E8779 Other fluid overload: Secondary | ICD-10-CM | POA: Diagnosis not present

## 2011-03-14 DIAGNOSIS — N186 End stage renal disease: Secondary | ICD-10-CM | POA: Diagnosis not present

## 2011-03-14 DIAGNOSIS — R0602 Shortness of breath: Secondary | ICD-10-CM | POA: Diagnosis not present

## 2011-03-14 DIAGNOSIS — J90 Pleural effusion, not elsewhere classified: Secondary | ICD-10-CM | POA: Diagnosis not present

## 2011-03-14 MED ORDER — CALCIUM CARBONATE ANTACID 500 MG PO CHEW: 2.0000 | CHEWABLE_TABLET | Freq: Three times a day (TID) | ORAL | Status: AC

## 2011-03-14 MED ORDER — DARBEPOETIN ALFA-POLYSORBATE 25 MCG/0.42ML IJ SOLN: 25.0000 ug | INTRAMUSCULAR | Status: DC

## 2011-03-14 MED ORDER — AMLODIPINE BESYLATE 10 MG PO TABS: 10.0000 mg | ORAL_TABLET | Freq: Every day | ORAL | Status: DC

## 2011-03-14 MED ORDER — PARICALCITOL 5 MCG/ML IV SOLN: 2.0000 ug | INTRAVENOUS | Status: DC

## 2011-03-14 MED ORDER — RENA-VITE PO TABS: 1.0000 | ORAL_TABLET | Freq: Every day | ORAL | Status: DC

## 2011-03-14 NOTE — Discharge Summary (Addendum)
DISCHARGE SUMMARY  Nathaniel White  MR#: [id number]  DOB:04/22/1960  Date of Admission: 03/13/2011 Date of Discharge: 03/14/2011  Attending Physician:Justis Closser T  Patient's PCP:  Dr. Mercy Moore, [facility]  Consults:  Estanislado Emms, MD-[facility]  Discharge Diagnoses: Present on Admission:  .Fluid overload .ESRD (end stage renal disease) on dialysis *persistent RLL pleural effusion - likely loculated - needs further evaluation  .HTN (hypertension) .Anemia .DM (diabetes mellitus) .Tobacco abuse    Medication List  As of 03/14/2011  3:08 PM   TAKE these medications         amLODipine 10 MG tablet   Commonly known as: NORVASC   Take 1 tablet (10 mg total) by mouth at bedtime.      aspirin EC 81 MG tablet   Take 81 mg by mouth daily.      calcium carbonate 500 MG chewable tablet   Commonly known as: TUMS - dosed in mg elemental calcium   Chew 2 tablets (400 mg of elemental calcium total) by mouth 3 (three) times daily with meals.      cinacalcet 30 MG tablet   Commonly known as: SENSIPAR   Take 30 mg by mouth daily.      cloNIDine 0.3 mg/24hr   Commonly known as: CATAPRES - Dosed in mg/24 hr   Place 1 patch onto the skin once a week. Change on Tuesday      darbepoetin 25 MCG/0.42ML Soln   Commonly known as: ARANESP   Inject 0.42 mLs (25 mcg total) into the vein every Tuesday with hemodialysis.      guaiFENesin 100 MG/5ML liquid   Commonly known as: ROBITUSSIN   Take 200 mg by mouth 3 (three) times daily as needed. For cough      labetalol 300 MG tablet   Commonly known as: NORMODYNE   Take 150 mg by mouth 2 (two) times daily.      multivitamin Tabs tablet   Take 1 tablet by mouth at bedtime.      paricalcitol 5 MCG/ML injection   Commonly known as: ZEMPLAR   Inject 0.4 mLs (2 mcg total) into the vein Every Tuesday,Thursday,and Saturday with dialysis.           Hospital Course:  51 year-old male with known history of  ESRD on hemodialysis on Tuesday Thursday and Saturdays, hypertension, previous history of CVA, and history of systolic heart failure per the 2-D echo in 2004 presented [facility] with shortness of breath worsening over the last 24 hours. Patient states he did not miss his dialysis as scheduled on Saturday.  Fluid overload/Shortness of breath/ ESRD (end stage renal disease) on dialysis  The patient reportedly often purposely shortens his dialysis treatments and has been difficult to manage.  With additional hemodialysis treatments during his inpatient stay his volume status has been much easier to manage.  He is presently asymptomatic.  He has been advised that he will need to strictly follow his outpatient dialysis schedule to include his routine treatment tomorrow.  He is advised that compliance with the full treatment is also necessary to minimize the risk for readmission.   HTN (hypertension)  Again, noncompliant with medications. BP now better controlled. Cont current management.  Advised patient on strict compliance with medications.  Pleural effusion  Review of old records reveals that the patient's right lower lobe pleural effusion is a chronic finding.  The patient left the hospital admission in February 2012 [place]  ADVICE during an evaluation for this finding.  I have discussed with him on the day of his discharge my fear that this could represent loculated pleural effusion.  I have advised him that repeat CT scan would be advisable.  He is adamant about being discharged home today.  As a result I am suggesting that a CT scan of the chest be accomplished in the outpatient setting to determine if the patient's effusion is loculated and if so will therefore require further evaluation.  Anemia  On Epo - stable  Tobacco abuse  Tobacco counseling requested.  History of nonadherence to medical treatment  The patient has been advised of the absolute need to follow  all of his medications and to comply with his full hemodialysis treatments as scheduled.  Day of Discharge BP 129/73  Pulse 81  Temp(Src) 98.7 F (37.1 C) (Oral)  Resp 16  Ht 5\' 9"  (1.753 m)  Wt 74.6 kg (164 lb 7.4 oz)  BMI 24.29 kg/m2  SpO2 92%  Physical Exam: General: No acute respiratory distress Lungs: Clear to auscultation bilaterally without wheezes or crackles Cardiovascular: Regular rate and rhythm without murmur gallop or rub normal S1 and S2 Abdomen: Nontender, nondistended, soft, bowel sounds positive, no rebound, no ascites, no appreciable mass Extremities: No significant cyanosis, clubbing, or edema bilateral lower extremities  Disposition: Discharge home at patient's request   Follow-up Appts: Discharge Orders    Future Orders Please Complete By Expires   Increase activity slowly        Follow-up Information    Follow up with [facility]. (as previously scheduled)        KEEP YOUR scheduled hemodialysis appointments - your next appointment is Thursday, 03/15/2011  Tests Needing Follow-up: As discussed above an outpatient CT scan of the chest is suggested to reevaluate the patient's right lower lobe chronic pleural effusion.  Time spent in discharge (includes decision making & examination of pt): 30 minutes  Signed: Clara Smolen T 03/14/2011, 3:08 PM

## 2011-03-14 NOTE — Progress Notes (Signed)
Assessment/Plan:  1. Volume Overload-  HD today in progress now, f/u CXR after HD.  He looks like he has lost more weight than documented  2. Doubt Pneumonia- pleural effusion with atelectasis   3. ESRD - S/p failed renal transplant on HD TTS.  If he is discharged he will need his regular treatment at the kidney center in am (shortened). (I have no objection to d/c if CXR significantly improved) He will have a new dry weight. 4. Hypertension/volume -improved  Subjective: Interval History: Less swelling in face and is breathing "good"  Objective: Vital signs in last 24 hours: Temp:  [97.4 F (36.3 C)-98.8 F (37.1 C)] 97.5 F (36.4 C) (03/06 0721) Pulse Rate:  [74-95] 83  (03/06 0900) Resp:  [16-30] 16  (03/06 0721) BP: (119-180)/(70-104) 125/78 mmHg (03/06 0900) SpO2:  [88 %-100 %] 100 % (03/06 0730) Weight:  [75.5 kg (166 lb 7.2 oz)-78.5 kg (173 lb 1 oz)] 76.3 kg (168 lb 3.4 oz) (03/06 0721) Weight change: 1.5 kg (3 lb 4.9 oz)  Intake/Output from previous day: 03/05 0701 - 03/06 0700 In: 700 [P.O.:700] Out: 3221  Intake/Output this shift:   General appearance: alert and cooperative Lungs clear Cor RRR abd soft Ext no edema  Lab Results:  [facility] 03/13/11 1028 03/13/11 0119  WBC 4.8 5.8  HGB 8.5* 9.0*  HCT 26.4* 28.1*  PLT 153 175   BMET:  Basename 03/13/11 1028 03/13/11 0119  NA 140 140  K 4.6 4.5  CL 96 94*  CO2 25 25  GLUCOSE 101* 97  BUN 78* 72*  CREATININE 14.04* 13.80*  CALCIUM 10.0 9.8   No results found for this basename: PTH:2 in the last 72 hours Iron Studies: No results found for this basename: IRON,TIBC,TRANSFERRIN,FERRITIN in the last 72 hours Studies/Results: Dg Chest 2 View  03/13/2011  *RADIOLOGY REPORT*  Clinical Data: Shortness of breath, orthopnea, history hypertension, renal transplant, smoking  CHEST - 2 VIEW  Comparison: 02/05/2011  Findings: Enlargement of cardiac silhouette with pulmonary vascular congestion. Persistent right pleural  effusion and basilar atelectasis versus consolidation. Left lung clear. No pneumothorax or acute osseous finding.  IMPRESSION: Enlargement of cardiac silhouette with pulmonary vascular congestion. Persistent right pleural effusion with atelectasis versus consolidation right lung base. No interval change.  Original Report Authenticated By: Burnetta Sabin, M.D.     LOS: 1 day   Kaesha Kirsch C 03/14/2011,9:29 AM

## 2011-03-14 NOTE — Discharge Instructions (Signed)
Dyspnea Shortness of breath (dyspnea) is the feeling of uneasy breathing. Dyspnea should be evaluated promptly. DIAGNOSIS  Many tests may be done to find why you are having shortness of breath. Tests may include:  A chest X-ray.   A lung function test.   Blood tests.   Recordings of the electrical activity of the heart (electrocardiogram).   Exercise testing.   Sound wave images of the heart (a cardiac echocardiogram).   A scan.  A cause for your shortness of breath may not be identified initially. In this case, it is important to have a follow-up exam with your caregiver. HOME CARE INSTRUCTIONS   Do not smoke. Smoking is a common cause of shortness of breath. Ask for help to stop smoking.   Avoid being around chemicals that may bother your breathing, such as paint fumes or dust.   Rest as needed. Slowly begin your usual activities.   If medications were prescribed, take them as directed for the full length of time directed. This includes oxygen and any inhaled medications, if prescribed.   It is very important that you follow up with your caregiver or other physician as directed. Waiting to do so or failure to follow up could result in worsening of your condition, possible disability, or death.   Be sure you understand what to do or who to call if your shortness of breath worsens.  SEEK MEDICAL CARE IF:   Your condition does not improve in the time expected.   You have a hard time doing your normal activities even with rest.   You have any side effects from or problems with medications prescribed.  SEEK IMMEDIATE MEDICAL CARE IF:   You feel your shortness of breath is getting worse.   You feel lightheaded, faint or develop a cough not controlled with medications.   You start coughing up blood.   You get pain with breathing.   You get chest pain or pain in your arms, shoulders or belly (abdomen).   You have a fever.   You are unable to walk up stairs or exercise  the way you normally can.  MAKE SURE YOU:   Understand these instructions.   Will watch your condition.   Will get help right away if you are not doing well or get worse.  Document Released: 02/02/2004 Document Revised: 09/06/2010 Document Reviewed: 05/12/2009  [facility] Patient Information 2012 Export.   End Stage Kidney Disease End-stage kidney disease occurs when your kidneys no longer work well enough to support day-to-day life. It usually occurs when longstanding (chronic) kidney failure gets worse, to the point where kidney function is less than 10% of normal. At this point, the kidney function is so low that death will occur from buildup of fluids and waste products in the body. The most common cause of kidney failure is diabetes. Kidney failure is very common. ESRD (End Stage Renal Disease) almost always follows chronic kidney failure or renal insufficiency. This condition may exist for 10 to 20 years or more before developing into ESRD. SYMPTOMS   Unintentional weight loss.   Fatigue, anemia.   Generalized itching (pruritus).   Easy bruising or bleeding.   Drowsiness, lethargy.   Muscle twitching or cramps.   Skin may appear yellow or brown.   General ill feeling.   Frequent hiccups.   No or decreased urine output.   Decreased alertness.   Coma.   Increased skin pigmentation.   Decreased sensation in the hands, feet, or other  areas.  DIAGNOSIS  Your caregiver will be able to tell what is wrong by talking to you, doing an examination, and doing laboratory tests. The blood work and urinalysis will show that your kidneys are not working well enough. TREATMENT  Dialysis or kidney transplantation are the only treatments for ESRD. Your health, age and other factors determine which treatment is best. Other treatments for chronic renal failure should continue. Associated diseases that cause kidney failure should be controlled. Some of these are:  Hypertension.    Kidney stones.   Heart failure.   Obstructions of the urinary tract.   Urinary tract infections.   Glomerulonephritis.  PROGNOSIS  ESRD is fatal unless treated with dialysis or transplantation. Both of these treatments can have serious risks and consequences. The outcome varies and is unique to each individual. RISKS AND COMPLICATIONS Complications of dialysis and kidney transplantation:  Hypertension (kidneys try to raise blood pressure so they can work better).   Platelet dysfunction (cells in blood which help with clotting are defective).   Gastrointestinal loss of blood, duodenal or peptic ulcers.   Hemorrhage (bleeding problems).   Anemia (not enough blood cells are produced).   Hepatitis B, hepatitis C, liver failure (exposure may occur during dialysis).   Infection from decreased operation of white blood cells and immune system.   Multiple cancers form, from long-term immunosuppressant use.   Peripheral neuropathy (damage to your nerves).   Seizures (convulsions).   Encephalopathy, nervous system damage, dementia (changes in your brain).   Weakening of the bones, fractures, joint disorders, joint replacements are common.   Permanent skin pigmentation changes.   Skin dryness, itching, scratching resulting in skin infection from hydration problems.   Changes in glucose metabolism.   Changes in electrolyte levels (salts in your blood).   Decreased libido, impotence (loss of interest in sex or ability to function well).   Miscarriage, menstrual irregularities, infertility.   Pericarditis (inflammation of the lining surrounding the heart).   Cardiac tamponade (fluid collection around the heart).   Heart failure in which your heart cannot pump well enough to keep up with the work.  PREVENTION  Treatment of the causes of longstanding kidney failure may delay or prevent progression to ESRD. For example, diabetes which is under strict control is less likely  to cause renal failure than diabetes which is left untreated. Some causes of renal failure cannot be treated. Document Released: 03/17/2003 Document Revised: 12/14/2010 Document Reviewed: 12/25/2004 [name] Patient Information 2012 [place].

## 2011-03-14 NOTE — Progress Notes (Signed)
Pt discharged to home, as ordered.  Pt educated on discharge instructions, prescriptions, and follow up appts.  Pt verbalized his understanding of teachings.  Pt's PIV and leads removed; belongings returned.  Pt picked up by friend, transported to hospital entrance via wheel chair. 03/14/2011 4:12 PM Favio Moder, Matt Holmes

## 2011-04-08 DIAGNOSIS — N186 End stage renal disease: Secondary | ICD-10-CM | POA: Diagnosis not present

## 2011-04-10 DIAGNOSIS — D631 Anemia in chronic kidney disease: Secondary | ICD-10-CM | POA: Diagnosis not present

## 2011-04-10 DIAGNOSIS — D509 Iron deficiency anemia, unspecified: Secondary | ICD-10-CM | POA: Diagnosis not present

## 2011-04-10 DIAGNOSIS — N2581 Secondary hyperparathyroidism of renal origin: Secondary | ICD-10-CM | POA: Diagnosis not present

## 2011-04-10 DIAGNOSIS — N186 End stage renal disease: Secondary | ICD-10-CM | POA: Diagnosis not present

## 2011-04-13 ENCOUNTER — Emergency Department (HOSPITAL_BASED_OUTPATIENT_CLINIC_OR_DEPARTMENT_OTHER)
Admission: EM | Admit: 2011-04-13 | Discharge: 2011-04-13 | Disposition: A | Discharge status: Home / Self Care | Payer: Medicare Other | Attending: Emergency Medicine | Admitting: Emergency Medicine

## 2011-04-13 ENCOUNTER — Emergency Department (INDEPENDENT_AMBULATORY_CARE_PROVIDER_SITE_OTHER): Payer: Medicare Other

## 2011-04-13 ENCOUNTER — Encounter (HOSPITAL_BASED_OUTPATIENT_CLINIC_OR_DEPARTMENT_OTHER): Payer: Self-pay

## 2011-04-13 DIAGNOSIS — M898X9 Other specified disorders of bone, unspecified site: Secondary | ICD-10-CM

## 2011-04-13 DIAGNOSIS — R269 Unspecified abnormalities of gait and mobility: Secondary | ICD-10-CM | POA: Insufficient documentation

## 2011-04-13 DIAGNOSIS — I70209 Unspecified atherosclerosis of native arteries of extremities, unspecified extremity: Secondary | ICD-10-CM | POA: Diagnosis not present

## 2011-04-13 DIAGNOSIS — Z8673 Personal history of transient ischemic attack (TIA), and cerebral infarction without residual deficits: Secondary | ICD-10-CM | POA: Insufficient documentation

## 2011-04-13 DIAGNOSIS — S8000XA Contusion of unspecified knee, initial encounter: Secondary | ICD-10-CM | POA: Diagnosis not present

## 2011-04-13 DIAGNOSIS — Z79899 Other long term (current) drug therapy: Secondary | ICD-10-CM | POA: Insufficient documentation

## 2011-04-13 DIAGNOSIS — Z7982 Long term (current) use of aspirin: Secondary | ICD-10-CM | POA: Insufficient documentation

## 2011-04-13 DIAGNOSIS — F17200 Nicotine dependence, unspecified, uncomplicated: Secondary | ICD-10-CM | POA: Diagnosis not present

## 2011-04-13 DIAGNOSIS — IMO0001 Reserved for inherently not codable concepts without codable children: Secondary | ICD-10-CM | POA: Insufficient documentation

## 2011-04-13 DIAGNOSIS — M25569 Pain in unspecified knee: Secondary | ICD-10-CM

## 2011-04-13 DIAGNOSIS — I10 Essential (primary) hypertension: Secondary | ICD-10-CM | POA: Insufficient documentation

## 2011-04-13 MED ORDER — IBUPROFEN 800 MG PO TABS
800.0000 mg | ORAL_TABLET | Freq: Once | ORAL | Status: AC
  Administered 2011-04-13: 800 mg via ORAL
  Filled 2011-04-13: qty 1

## 2011-04-13 MED ORDER — IBUPROFEN 800 MG PO TABS: 800.0000 mg | ORAL_TABLET | Freq: Three times a day (TID) | ORAL | Status: AC

## 2011-04-13 NOTE — Discharge Instructions (Signed)
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
## 2011-05-03 DIAGNOSIS — E119 Type 2 diabetes mellitus without complications: Secondary | ICD-10-CM | POA: Diagnosis not present

## 2011-05-08 DIAGNOSIS — N186 End stage renal disease: Secondary | ICD-10-CM | POA: Diagnosis not present

## 2011-05-10 DIAGNOSIS — D509 Iron deficiency anemia, unspecified: Secondary | ICD-10-CM | POA: Diagnosis not present

## 2011-05-10 DIAGNOSIS — D631 Anemia in chronic kidney disease: Secondary | ICD-10-CM | POA: Diagnosis not present

## 2011-05-10 DIAGNOSIS — N2581 Secondary hyperparathyroidism of renal origin: Secondary | ICD-10-CM | POA: Diagnosis not present

## 2011-05-10 DIAGNOSIS — N039 Chronic nephritic syndrome with unspecified morphologic changes: Secondary | ICD-10-CM | POA: Diagnosis not present

## 2011-05-10 DIAGNOSIS — N186 End stage renal disease: Secondary | ICD-10-CM | POA: Diagnosis not present

## 2011-05-12 DIAGNOSIS — D509 Iron deficiency anemia, unspecified: Secondary | ICD-10-CM | POA: Diagnosis not present

## 2011-05-12 DIAGNOSIS — N186 End stage renal disease: Secondary | ICD-10-CM | POA: Diagnosis not present

## 2011-05-12 DIAGNOSIS — N2581 Secondary hyperparathyroidism of renal origin: Secondary | ICD-10-CM | POA: Diagnosis not present

## 2011-05-12 DIAGNOSIS — D631 Anemia in chronic kidney disease: Secondary | ICD-10-CM | POA: Diagnosis not present

## 2011-05-15 DIAGNOSIS — N186 End stage renal disease: Secondary | ICD-10-CM | POA: Diagnosis not present

## 2011-05-15 DIAGNOSIS — D631 Anemia in chronic kidney disease: Secondary | ICD-10-CM | POA: Diagnosis not present

## 2011-05-15 DIAGNOSIS — D509 Iron deficiency anemia, unspecified: Secondary | ICD-10-CM | POA: Diagnosis not present

## 2011-05-15 DIAGNOSIS — N2581 Secondary hyperparathyroidism of renal origin: Secondary | ICD-10-CM | POA: Diagnosis not present

## 2011-05-17 DIAGNOSIS — N2581 Secondary hyperparathyroidism of renal origin: Secondary | ICD-10-CM | POA: Diagnosis not present

## 2011-05-17 DIAGNOSIS — D631 Anemia in chronic kidney disease: Secondary | ICD-10-CM | POA: Diagnosis not present

## 2011-05-17 DIAGNOSIS — D509 Iron deficiency anemia, unspecified: Secondary | ICD-10-CM | POA: Diagnosis not present

## 2011-05-17 DIAGNOSIS — N186 End stage renal disease: Secondary | ICD-10-CM | POA: Diagnosis not present

## 2011-05-19 DIAGNOSIS — N186 End stage renal disease: Secondary | ICD-10-CM | POA: Diagnosis not present

## 2011-05-19 DIAGNOSIS — D509 Iron deficiency anemia, unspecified: Secondary | ICD-10-CM | POA: Diagnosis not present

## 2011-05-19 DIAGNOSIS — N2581 Secondary hyperparathyroidism of renal origin: Secondary | ICD-10-CM | POA: Diagnosis not present

## 2011-05-19 DIAGNOSIS — D631 Anemia in chronic kidney disease: Secondary | ICD-10-CM | POA: Diagnosis not present

## 2011-05-21 ENCOUNTER — Encounter (HOSPITAL_BASED_OUTPATIENT_CLINIC_OR_DEPARTMENT_OTHER): Payer: Self-pay | Admitting: *Deleted

## 2011-05-21 ENCOUNTER — Emergency Department (INDEPENDENT_AMBULATORY_CARE_PROVIDER_SITE_OTHER): Payer: Medicare Other

## 2011-05-21 ENCOUNTER — Emergency Department (HOSPITAL_BASED_OUTPATIENT_CLINIC_OR_DEPARTMENT_OTHER)
Admission: EM | Admit: 2011-05-21 | Discharge: 2011-05-21 | Disposition: A | Discharge status: Home / Self Care | Payer: Medicare Other | Attending: Emergency Medicine | Admitting: Emergency Medicine

## 2011-05-21 DIAGNOSIS — J40 Bronchitis, not specified as acute or chronic: Secondary | ICD-10-CM | POA: Diagnosis not present

## 2011-05-21 DIAGNOSIS — R05 Cough: Secondary | ICD-10-CM

## 2011-05-21 DIAGNOSIS — I10 Essential (primary) hypertension: Secondary | ICD-10-CM | POA: Diagnosis not present

## 2011-05-21 DIAGNOSIS — N289 Disorder of kidney and ureter, unspecified: Secondary | ICD-10-CM | POA: Insufficient documentation

## 2011-05-21 DIAGNOSIS — J209 Acute bronchitis, unspecified: Secondary | ICD-10-CM | POA: Diagnosis not present

## 2011-05-21 DIAGNOSIS — Z940 Kidney transplant status: Secondary | ICD-10-CM | POA: Diagnosis not present

## 2011-05-21 DIAGNOSIS — R918 Other nonspecific abnormal finding of lung field: Secondary | ICD-10-CM

## 2011-05-21 DIAGNOSIS — R5383 Other fatigue: Secondary | ICD-10-CM

## 2011-05-21 DIAGNOSIS — Z992 Dependence on renal dialysis: Secondary | ICD-10-CM | POA: Insufficient documentation

## 2011-05-21 DIAGNOSIS — R0602 Shortness of breath: Secondary | ICD-10-CM | POA: Insufficient documentation

## 2011-05-21 DIAGNOSIS — F17200 Nicotine dependence, unspecified, uncomplicated: Secondary | ICD-10-CM | POA: Diagnosis not present

## 2011-05-21 DIAGNOSIS — R059 Cough, unspecified: Secondary | ICD-10-CM | POA: Insufficient documentation

## 2011-05-21 DIAGNOSIS — J90 Pleural effusion, not elsewhere classified: Secondary | ICD-10-CM | POA: Diagnosis not present

## 2011-05-21 DIAGNOSIS — R5381 Other malaise: Secondary | ICD-10-CM

## 2011-05-21 DIAGNOSIS — Z8673 Personal history of transient ischemic attack (TIA), and cerebral infarction without residual deficits: Secondary | ICD-10-CM | POA: Insufficient documentation

## 2011-05-21 MED ORDER — ALBUTEROL SULFATE (5 MG/ML) 0.5% IN NEBU
INHALATION_SOLUTION | RESPIRATORY_TRACT | Status: AC
  Administered 2011-05-21: 5 mg via RESPIRATORY_TRACT
  Filled 2011-05-21: qty 1

## 2011-05-21 MED ORDER — PREDNISONE 20 MG PO TABS: 60.0000 mg | ORAL_TABLET | Freq: Every day | ORAL | Status: AC

## 2011-05-21 MED ORDER — ALBUTEROL SULFATE (5 MG/ML) 0.5% IN NEBU
5.0000 mg | INHALATION_SOLUTION | Freq: Once | RESPIRATORY_TRACT | Status: AC
  Administered 2011-05-21: 5 mg via RESPIRATORY_TRACT

## 2011-05-21 MED ORDER — AZITHROMYCIN 250 MG PO TABS: 250.0000 mg | ORAL_TABLET | Freq: Every day | ORAL | Status: AC

## 2011-05-21 MED ORDER — IPRATROPIUM BROMIDE 0.02 % IN SOLN
RESPIRATORY_TRACT | Status: AC
  Administered 2011-05-21: 0.5 mg via RESPIRATORY_TRACT
  Filled 2011-05-21: qty 2.5

## 2011-05-21 MED ORDER — ALBUTEROL SULFATE HFA 108 (90 BASE) MCG/ACT IN AERS
2.0000 | INHALATION_SPRAY | RESPIRATORY_TRACT | Status: DC | PRN
  Administered 2011-05-21: 2 via RESPIRATORY_TRACT
  Filled 2011-05-21: qty 6.7

## 2011-05-21 MED ORDER — IPRATROPIUM BROMIDE 0.02 % IN SOLN
0.5000 mg | Freq: Once | RESPIRATORY_TRACT | Status: AC
  Administered 2011-05-21: 0.5 mg via RESPIRATORY_TRACT

## 2011-05-21 MED ORDER — AZITHROMYCIN 250 MG PO TABS
500.0000 mg | ORAL_TABLET | Freq: Once | ORAL | Status: AC
  Administered 2011-05-21: 500 mg via ORAL
  Filled 2011-05-21: qty 2

## 2011-05-21 MED ORDER — PREDNISONE 50 MG PO TABS
60.0000 mg | ORAL_TABLET | Freq: Once | ORAL | Status: AC
  Administered 2011-05-21: 60 mg via ORAL
  Filled 2011-05-21: qty 1

## 2011-05-21 NOTE — ED Provider Notes (Signed)
History    This chart was scribed for Trisha Mangle, MD, MD by Rhae Lerner. The patient was seen in room [place] and the patient's care was started at 9:01PM.   CSN: [id number]  Arrival date & time 05/21/11  2051   First MD Initiated Contact with Patient 05/21/11 2057      Chief Complaint  Patient presents with  . URI    (Consider location/radiation/quality/duration/timing/severity/associated sxs/prior treatment) Patient is a 51 y.o. male presenting with URI. The history is provided by the patient.  URI The primary symptoms include cough. Primary symptoms do not include fever, fatigue, headaches, sore throat, abdominal pain, nausea, vomiting, myalgias or arthralgias.  Symptoms associated with the illness include congestion. The illness is not associated with rhinorrhea.   Nathaniel White is a 51 y.o. male who presents to the Emergency Department complaining of moderate congestion, SOB and productive cough. He reports that he could not talk due to SOB. Pt reports that he took amoxiciline today with minor relief. Pt is a dialysis pt Tues, Thurs and Sat. His last dialysis was past Saturday. Symptoms have been constant without radiation. Denies any other pain. Pt has history of renal insufficiency, and stroke.   Past Medical History  Diagnosis Date  . Hypertension   . Renal insufficiency   . Anemia   . Stroke     Past Surgical History  Procedure Date  . Kidney transplant     2011 rejected kidney 2012 back on dialysis  . Av fistula placement     No family history on file.  History  Substance Use Topics  . Smoking status: Current Everyday Smoker  . Smokeless tobacco: Not on file  . Alcohol Use: No      Review of Systems  Constitutional: Negative for fever, activity change, appetite change and fatigue.  HENT: Positive for congestion. Negative for sore throat, rhinorrhea, neck pain and neck stiffness.   Respiratory: Positive for cough and shortness of breath.     Cardiovascular: Negative for chest pain and palpitations.  Gastrointestinal: Negative for nausea, vomiting and abdominal pain.  Genitourinary: Negative for dysuria, urgency, frequency and flank pain.  Musculoskeletal: Negative for myalgias, back pain and arthralgias.  Neurological: Negative for dizziness, weakness, light-headedness, numbness and headaches.  All other systems reviewed and are negative.   10 Systems reviewed and all are negative for acute change except as noted in the HPI.   Allergies  Codeine  Home Medications   Current Outpatient Rx  Name Route Sig Dispense Refill  . AMLODIPINE BESYLATE 10 MG PO TABS Oral Take 1 tablet (10 mg total) by mouth at bedtime. 30 tablet 0  . ASPIRIN EC 81 MG PO TBEC Oral Take 81 mg by mouth daily.    Marland Kitchen CLONIDINE HCL 0.3 MG/24HR TD PTWK Transdermal Place 1 patch onto the skin once a week. Change on Tuesday    . DARBEPOETIN ALFA-POLYSORBATE 25 MCG/0.42ML IJ SOLN Intravenous Inject 0.42 mLs (25 mcg total) into the vein every Tuesday with hemodialysis. 14.28 mL   . LABETALOL HCL 300 MG PO TABS Oral Take 150 mg by mouth 2 (two) times daily.    Marland Kitchen RENA-VITE PO TABS Oral Take 1 tablet by mouth at bedtime. 30 tablet 0  . PARICALCITOL 5 MCG/ML IV SOLN Intravenous Inject 0.4 mLs (2 mcg total) into the vein Every Tuesday,Thursday,and Saturday with dialysis. 1 mL   . AZITHROMYCIN 250 MG PO TABS Oral Take 1 tablet (250 mg total) by mouth daily. 4 tablet 0  .  CALCIUM CARBONATE ANTACID 500 MG PO CHEW Oral Chew 2 tablets (400 mg of elemental calcium total) by mouth 3 (three) times daily with meals.    Marland Kitchen CINACALCET HCL 30 MG PO TABS Oral Take 30 mg by mouth daily.    Marland Kitchen PREDNISONE 20 MG PO TABS Oral Take 3 tablets (60 mg total) by mouth daily. 15 tablet 0    BP 172/109  Pulse 99  Temp(Src) 98.3 F (36.8 C) (Oral)  Resp 18  SpO2 100%  Physical Exam  Nursing note and vitals reviewed. Constitutional: He is oriented to person, place, and time. He appears  well-developed and well-nourished. No distress.  HENT:  Head: Normocephalic and atraumatic.  Neck: Normal range of motion. Neck supple.  Cardiovascular: Normal rate, regular rhythm and normal heart sounds.   Pulmonary/Chest: Effort normal. No respiratory distress. He has decreased breath sounds in the right lower field and the left lower field. He has rhonchi (diffuse).  Abdominal: Soft. He exhibits no distension.  Musculoskeletal: He exhibits no edema.  Neurological: He is alert and oriented to person, place, and time.  Skin: Skin is warm and dry.  Psychiatric: He has a normal mood and affect. His behavior is normal.    ED Course  Procedures (including critical care time) DIAGNOSTIC STUDIES: Oxygen Saturation is 97% on room air, normal by my interpretation.    COORDINATION OF CARE: 9:07PM EDP discusses pt ED treatment with pt. 9:15PM EDP orders medication: atrovent 0.5 mg   Labs Reviewed - No data to display Dg Chest 2 View  05/21/2011  *RADIOLOGY REPORT*  Clinical Data: Cough and weakness  CHEST - 2 VIEW  Comparison: 03/14/2011  Findings: Shallow inspiration.  Moderate sized right pleural effusion with atelectasis and infiltration in the right lung base. Findings may be due to pneumonia  or compressive atelectasis.  This appears stable since the previous study, allowing for differences in technique.  Mild cardiac enlargement with normal pulmonary vascularity.  Calcification of the aorta.  No pneumothorax.  IMPRESSION: Right pleural effusion with atelectasis or infiltration in the right lung base, similar to previous study.  Original Report Authenticated By: Neale Burly, M.D.     1. Pleural effusion   2. Bronchitis       MDM  Pleural effusion and bronchitis. The infiltrate is consistent compared to prior studies have her in the presence of his overall symptomatology I will treat him for a pneumonia with Zithromax. This is likely secondary to compressive atelectasis. He  received 2 breathing treatments with resolution of his symptoms. He will be discharged home on albuterol, prednisone, Zithromax. He received his first dose the emergency department. Instructed to attend HD as directed  I personally performed the services described in this documentation, which was scribed in my presence. The recorded information has been reviewed and considered.        Trisha Mangle, MD 05/21/11 2207

## 2011-05-21 NOTE — ED Notes (Signed)
+   brui, + thrill to left upper arm av fistula

## 2011-05-21 NOTE — ED Notes (Signed)
Chest and nasal congestion since last night. Cough with yellow sputum. Sneezing, runny nose and facial pain.

## 2011-05-21 NOTE — ED Notes (Signed)
Dialysis patient, last dialysis Saturday, c/o congestion and sob, started 5/12, + cough, pt states coughing up yellow , pt states he took 500mg  amoxicillan today, pt states he did get some better after antx, stated that prior to that he could not even talk he was so sob,

## 2011-05-21 NOTE — Discharge Instructions (Signed)
Bronchitis Bronchitis is the body's way of reacting to injury and/or infection (inflammation) of the bronchi. Bronchi are the air tubes that extend from the windpipe into the lungs. If the inflammation becomes severe, it may cause shortness of breath. CAUSES  Inflammation may be caused by:  A virus.   Germs (bacteria).   Dust.   Allergens.   Pollutants and many other irritants.  The cells lining the bronchial tree are covered with tiny hairs (cilia). These constantly beat upward, away from the lungs, toward the mouth. This keeps the lungs free of pollutants. When these cells become too irritated and are unable to do their job, mucus begins to develop. This causes the characteristic cough of bronchitis. The cough clears the lungs when the cilia are unable to do their job. Without either of these protective mechanisms, the mucus would settle in the lungs. Then you would develop pneumonia. Smoking is a common cause of bronchitis and can contribute to pneumonia. Stopping this habit is the single most important thing you can do to help yourself. TREATMENT   Your caregiver may prescribe an antibiotic if the cough is caused by bacteria. Also, medicines that open up your airways make it easier to breathe. Your caregiver may also recommend or prescribe an expectorant. It will loosen the mucus to be coughed up. Only take over-the-counter or prescription medicines for pain, discomfort, or fever as directed by your caregiver.   Removing whatever causes the problem (smoking, for example) is critical to preventing the problem from getting worse.   Cough suppressants may be prescribed for relief of cough symptoms.   Inhaled medicines may be prescribed to help with symptoms now and to help prevent problems from returning.   For those with recurrent (chronic) bronchitis, there may be a need for steroid medicines.  SEEK IMMEDIATE MEDICAL CARE IF:   During treatment, you develop more pus-like mucus  (purulent sputum).   You have a fever.   Your baby is older than 3 months with a rectal temperature of 102 F (38.9 C) or higher.   Your baby is 93 months old or younger with a rectal temperature of 100.4 F (38 C) or higher.   You become progressively more ill.   You have increased difficulty breathing, wheezing, or shortness of breath.  It is necessary to seek immediate medical care if you are elderly or sick from any other disease. MAKE SURE YOU:   Understand these instructions.   Will watch your condition.   Will get help right away if you are not doing well or get worse.  Document Released: 12/25/2004 Document Revised: 12/14/2010 Document Reviewed: 11/04/2007 [facility] Patient Information 2012 [place].  Pleural Effusion The lining covering your lungs and the inside of your chest is called the pleura. Usually, the space between the 2 pleura contains no air and only a thin layer of fluid. A pleural effusion is an abnormal buildup of fluid in the pleural space. Fluid gathers when there is increased pressure in the lung vessels. This forces fluids out of the lungs and into the pleural space. Vessels may also leak fluids when there are infections, such as pneumonia, or other causes of soreness and redness (inflammation). Fluids leak into the lungs when protein in the blood is low or when certain vessels (lymphatics) are blocked. Finding a pleural effusion is important because it is usually caused by another disease. In order to treat a pleural effusion, your caregiver needs to find its cause. If left untreated, a  large amount of fluid can build up and cause collapse of the lung. CAUSES   Heart failure.   Infections (pneumonia, tuberculosis), pulmonary embolism, pulmonary infarction.   Cancer (primary lung and metastatic), asbestosis.   Liver failure (cirrhosis).   Nephrotic syndrome, peritoneal dialysis, kidney problems (uremia).   Collagen vascular disease (systemic  lupus erythematosis, rheumatoid arthritis).   Injury (trauma) to the chest or rupture of the digestive tube (esophagus).   Material in the chest or pleural space (hemothorax, chylothorax).   Pancreatitis.   Surgery.   Drug reactions.  SYMPTOMS  A pleural effusion can decrease the amount of space available for breathing and make you short of breath. The fluid can become infected, which may cause pain and fever. Often, the pain is worse when taking a deep breath. The underlying disease (heart failure, pneumonia, blood clot, tuberculosis, cancer) may also cause symptoms. DIAGNOSIS   Your caregiver can usually tell what is wrong by talking to you (taking a history), doing an exam, and taking a routine X-ray. If the X-ray shows fluid in your chest, often fluid is removed from your chest with a needle for testing (diagnostic thoracentesis).   Sometimes, more specialized X-rays may be needed.   Sometimes, a small piece of tissue is removed and examined by a specialist (biopsy).  TREATMENT  Treatment varies based on what caused the pleural effusion. Treatments include:  Removing as much fluid as possible using a needle (thoracentesis) to improve the cough and shortness of breath. This is a simple procedure which can be done at bedside. The risks are bleeding, infection, collapse of a lung, or low blood pressure.   Placing a tube in the chest to drain the effusion (tube thoracostomy). This is often used when there is an infection in the fluid. This is a simple procedure which can often be done at bedside or in a clinic. The procedure may be painful. The risks are the same as using a needle to drain the fluid. The chest tube usually remains for a few days and is connected to suction to improve fluid drainage. The tube, after placement, usually does not cause much discomfort.   Surgical removal of fibrous debris in and around the pleural space (decortication). This may be done with a flexible  telescope (thoracoscope) through a small or large cut (incision). This is helpful for patients who have fibrosis or scar tissue that prevents complete lung expansion. The risks are infection, blood loss, and side effects from general anesthesia.   Sometimes, a procedure called pleurodesis is done. A chest tube is placed and the fluid is drained. Next, an agent (tetracycline, talc powder) is added to the pleural space. This causes the lung and chest wall to stick together (adhesion). This leaves no potential space for fluid to build up. The risks include infection, blood loss, and side effects from general anesthesia.   If the effusion is caused by infection, it may be treated with antibiotics and improve without draining.  HOME CARE INSTRUCTIONS   Take any medicines exactly as prescribed.   Follow up with your caregiver as directed.   Monitor your exercise capacity (the amount of walking you can do before you get short of breath).   Do not smoke. Ask your caregiver for help quitting.  SEEK MEDICAL CARE IF:   Your exercise capacity seems to get worse or does not improve with time.   You do not recover from your illness.  SEEK IMMEDIATE MEDICAL CARE IF:   Shortness  of breath or chest pain develops or gets worse.   You have an oral temperature above 102 F (38.9 C), not controlled by medicine.   You develop a new cough, especially if the mucus (phlegm) is discolored.  MAKE SURE YOU:   Understand these instructions.   Will watch your condition.   Will get help right away if you are not doing well or get worse.  Document Released: 12/25/2004 Document Revised: 12/14/2010 Document Reviewed: 08/16/2006 [facility] Patient Information 2012 [place].

## 2011-05-22 DIAGNOSIS — D631 Anemia in chronic kidney disease: Secondary | ICD-10-CM | POA: Diagnosis not present

## 2011-05-22 DIAGNOSIS — D509 Iron deficiency anemia, unspecified: Secondary | ICD-10-CM | POA: Diagnosis not present

## 2011-05-22 DIAGNOSIS — N2581 Secondary hyperparathyroidism of renal origin: Secondary | ICD-10-CM | POA: Diagnosis not present

## 2011-05-22 DIAGNOSIS — N186 End stage renal disease: Secondary | ICD-10-CM | POA: Diagnosis not present

## 2011-05-24 DIAGNOSIS — N186 End stage renal disease: Secondary | ICD-10-CM | POA: Diagnosis not present

## 2011-05-24 DIAGNOSIS — D631 Anemia in chronic kidney disease: Secondary | ICD-10-CM | POA: Diagnosis not present

## 2011-05-24 DIAGNOSIS — N2581 Secondary hyperparathyroidism of renal origin: Secondary | ICD-10-CM | POA: Diagnosis not present

## 2011-05-24 DIAGNOSIS — D509 Iron deficiency anemia, unspecified: Secondary | ICD-10-CM | POA: Diagnosis not present

## 2011-05-26 DIAGNOSIS — D509 Iron deficiency anemia, unspecified: Secondary | ICD-10-CM | POA: Diagnosis not present

## 2011-05-26 DIAGNOSIS — D631 Anemia in chronic kidney disease: Secondary | ICD-10-CM | POA: Diagnosis not present

## 2011-05-26 DIAGNOSIS — N2581 Secondary hyperparathyroidism of renal origin: Secondary | ICD-10-CM | POA: Diagnosis not present

## 2011-05-26 DIAGNOSIS — N186 End stage renal disease: Secondary | ICD-10-CM | POA: Diagnosis not present

## 2011-05-29 DIAGNOSIS — N2581 Secondary hyperparathyroidism of renal origin: Secondary | ICD-10-CM | POA: Diagnosis not present

## 2011-05-29 DIAGNOSIS — N039 Chronic nephritic syndrome with unspecified morphologic changes: Secondary | ICD-10-CM | POA: Diagnosis not present

## 2011-05-29 DIAGNOSIS — N186 End stage renal disease: Secondary | ICD-10-CM | POA: Diagnosis not present

## 2011-05-29 DIAGNOSIS — D509 Iron deficiency anemia, unspecified: Secondary | ICD-10-CM | POA: Diagnosis not present

## 2011-05-31 DIAGNOSIS — N039 Chronic nephritic syndrome with unspecified morphologic changes: Secondary | ICD-10-CM | POA: Diagnosis not present

## 2011-05-31 DIAGNOSIS — N2581 Secondary hyperparathyroidism of renal origin: Secondary | ICD-10-CM | POA: Diagnosis not present

## 2011-05-31 DIAGNOSIS — N186 End stage renal disease: Secondary | ICD-10-CM | POA: Diagnosis not present

## 2011-05-31 DIAGNOSIS — D509 Iron deficiency anemia, unspecified: Secondary | ICD-10-CM | POA: Diagnosis not present

## 2011-06-02 DIAGNOSIS — N2581 Secondary hyperparathyroidism of renal origin: Secondary | ICD-10-CM | POA: Diagnosis not present

## 2011-06-02 DIAGNOSIS — D509 Iron deficiency anemia, unspecified: Secondary | ICD-10-CM | POA: Diagnosis not present

## 2011-06-02 DIAGNOSIS — N039 Chronic nephritic syndrome with unspecified morphologic changes: Secondary | ICD-10-CM | POA: Diagnosis not present

## 2011-06-02 DIAGNOSIS — N186 End stage renal disease: Secondary | ICD-10-CM | POA: Diagnosis not present

## 2011-06-05 DIAGNOSIS — N2581 Secondary hyperparathyroidism of renal origin: Secondary | ICD-10-CM | POA: Diagnosis not present

## 2011-06-05 DIAGNOSIS — N039 Chronic nephritic syndrome with unspecified morphologic changes: Secondary | ICD-10-CM | POA: Diagnosis not present

## 2011-06-05 DIAGNOSIS — N186 End stage renal disease: Secondary | ICD-10-CM | POA: Diagnosis not present

## 2011-06-05 DIAGNOSIS — D509 Iron deficiency anemia, unspecified: Secondary | ICD-10-CM | POA: Diagnosis not present

## 2011-06-06 ENCOUNTER — Other Ambulatory Visit (HOSPITAL_COMMUNITY): Payer: Self-pay | Admitting: Nephrology

## 2011-06-06 DIAGNOSIS — J90 Pleural effusion, not elsewhere classified: Secondary | ICD-10-CM

## 2011-06-07 DIAGNOSIS — D631 Anemia in chronic kidney disease: Secondary | ICD-10-CM | POA: Diagnosis not present

## 2011-06-07 DIAGNOSIS — D509 Iron deficiency anemia, unspecified: Secondary | ICD-10-CM | POA: Diagnosis not present

## 2011-06-07 DIAGNOSIS — N186 End stage renal disease: Secondary | ICD-10-CM | POA: Diagnosis not present

## 2011-06-07 DIAGNOSIS — N2581 Secondary hyperparathyroidism of renal origin: Secondary | ICD-10-CM | POA: Diagnosis not present

## 2011-06-08 DIAGNOSIS — N186 End stage renal disease: Secondary | ICD-10-CM | POA: Diagnosis not present

## 2011-06-09 DIAGNOSIS — D509 Iron deficiency anemia, unspecified: Secondary | ICD-10-CM | POA: Diagnosis not present

## 2011-06-09 DIAGNOSIS — D631 Anemia in chronic kidney disease: Secondary | ICD-10-CM | POA: Diagnosis not present

## 2011-06-09 DIAGNOSIS — N186 End stage renal disease: Secondary | ICD-10-CM | POA: Diagnosis not present

## 2011-06-09 DIAGNOSIS — N2581 Secondary hyperparathyroidism of renal origin: Secondary | ICD-10-CM | POA: Diagnosis not present

## 2011-06-11 ENCOUNTER — Ambulatory Visit (HOSPITAL_COMMUNITY)
Admission: RE | Admit: 2011-06-11 | Discharge: 2011-06-11 | Disposition: A | Discharge status: Home / Self Care | Payer: Medicare Other | Source: Ambulatory Visit | Attending: Radiology | Admitting: Radiology

## 2011-06-11 ENCOUNTER — Ambulatory Visit (HOSPITAL_COMMUNITY)
Admission: RE | Admit: 2011-06-11 | Discharge: 2011-06-11 | Disposition: A | Discharge status: Home / Self Care | Payer: Medicare Other | Source: Ambulatory Visit | Attending: Nephrology | Admitting: Nephrology

## 2011-06-11 VITALS — BP 137/86

## 2011-06-11 DIAGNOSIS — R899 Unspecified abnormal finding in specimens from other organs, systems and tissues: Secondary | ICD-10-CM | POA: Diagnosis not present

## 2011-06-11 DIAGNOSIS — R05 Cough: Secondary | ICD-10-CM | POA: Insufficient documentation

## 2011-06-11 DIAGNOSIS — N186 End stage renal disease: Secondary | ICD-10-CM | POA: Insufficient documentation

## 2011-06-11 DIAGNOSIS — R059 Cough, unspecified: Secondary | ICD-10-CM | POA: Diagnosis not present

## 2011-06-11 DIAGNOSIS — F17200 Nicotine dependence, unspecified, uncomplicated: Secondary | ICD-10-CM | POA: Insufficient documentation

## 2011-06-11 DIAGNOSIS — J90 Pleural effusion, not elsewhere classified: Secondary | ICD-10-CM | POA: Insufficient documentation

## 2011-06-11 NOTE — Procedures (Signed)
US guided diagnostic/therapeutic right thoracentesis performed yielding 570 cc's blood-tinged fluid. The fluid was submitted for cytology. F/u CXR pending. No immediate complications.

## 2011-06-12 ENCOUNTER — Telehealth (HOSPITAL_COMMUNITY): Payer: Self-pay | Admitting: Radiology

## 2011-07-08 DIAGNOSIS — N186 End stage renal disease: Secondary | ICD-10-CM | POA: Diagnosis not present

## 2011-07-10 DIAGNOSIS — D631 Anemia in chronic kidney disease: Secondary | ICD-10-CM | POA: Diagnosis not present

## 2011-07-10 DIAGNOSIS — N186 End stage renal disease: Secondary | ICD-10-CM | POA: Diagnosis not present

## 2011-07-10 DIAGNOSIS — N2581 Secondary hyperparathyroidism of renal origin: Secondary | ICD-10-CM | POA: Diagnosis not present

## 2011-07-10 DIAGNOSIS — D509 Iron deficiency anemia, unspecified: Secondary | ICD-10-CM | POA: Diagnosis not present

## 2011-07-12 DIAGNOSIS — N186 End stage renal disease: Secondary | ICD-10-CM | POA: Diagnosis not present

## 2011-07-12 DIAGNOSIS — D509 Iron deficiency anemia, unspecified: Secondary | ICD-10-CM | POA: Diagnosis not present

## 2011-07-12 DIAGNOSIS — N2581 Secondary hyperparathyroidism of renal origin: Secondary | ICD-10-CM | POA: Diagnosis not present

## 2011-07-12 DIAGNOSIS — D631 Anemia in chronic kidney disease: Secondary | ICD-10-CM | POA: Diagnosis not present

## 2011-07-14 DIAGNOSIS — D631 Anemia in chronic kidney disease: Secondary | ICD-10-CM | POA: Diagnosis not present

## 2011-07-14 DIAGNOSIS — N186 End stage renal disease: Secondary | ICD-10-CM | POA: Diagnosis not present

## 2011-07-14 DIAGNOSIS — N2581 Secondary hyperparathyroidism of renal origin: Secondary | ICD-10-CM | POA: Diagnosis not present

## 2011-07-14 DIAGNOSIS — D509 Iron deficiency anemia, unspecified: Secondary | ICD-10-CM | POA: Diagnosis not present

## 2011-07-15 ENCOUNTER — Emergency Department (HOSPITAL_BASED_OUTPATIENT_CLINIC_OR_DEPARTMENT_OTHER)
Admission: EM | Admit: 2011-07-15 | Discharge: 2011-07-15 | Disposition: A | Discharge status: Home / Self Care | Payer: Medicare Other | Attending: Emergency Medicine | Admitting: Emergency Medicine

## 2011-07-15 ENCOUNTER — Emergency Department (HOSPITAL_BASED_OUTPATIENT_CLINIC_OR_DEPARTMENT_OTHER): Payer: Medicare Other

## 2011-07-15 ENCOUNTER — Encounter (HOSPITAL_BASED_OUTPATIENT_CLINIC_OR_DEPARTMENT_OTHER): Payer: Self-pay | Admitting: *Deleted

## 2011-07-15 DIAGNOSIS — J90 Pleural effusion, not elsewhere classified: Secondary | ICD-10-CM | POA: Diagnosis not present

## 2011-07-15 DIAGNOSIS — R0602 Shortness of breath: Secondary | ICD-10-CM | POA: Diagnosis not present

## 2011-07-15 DIAGNOSIS — N186 End stage renal disease: Secondary | ICD-10-CM | POA: Insufficient documentation

## 2011-07-15 DIAGNOSIS — J9801 Acute bronchospasm: Secondary | ICD-10-CM | POA: Diagnosis not present

## 2011-07-15 DIAGNOSIS — I120 Hypertensive chronic kidney disease with stage 5 chronic kidney disease or end stage renal disease: Secondary | ICD-10-CM | POA: Insufficient documentation

## 2011-07-15 DIAGNOSIS — Z992 Dependence on renal dialysis: Secondary | ICD-10-CM | POA: Insufficient documentation

## 2011-07-15 MED ORDER — ALBUTEROL SULFATE (5 MG/ML) 0.5% IN NEBU
5.0000 mg | INHALATION_SOLUTION | Freq: Once | RESPIRATORY_TRACT | Status: AC
  Administered 2011-07-15: 5 mg via RESPIRATORY_TRACT

## 2011-07-15 MED ORDER — ALBUTEROL SULFATE HFA 108 (90 BASE) MCG/ACT IN AERS
2.0000 | INHALATION_SPRAY | RESPIRATORY_TRACT | Status: DC | PRN
  Administered 2011-07-15: 2 via RESPIRATORY_TRACT

## 2011-07-15 MED ORDER — ALBUTEROL SULFATE HFA 108 (90 BASE) MCG/ACT IN AERS
INHALATION_SPRAY | RESPIRATORY_TRACT | Status: AC
  Filled 2011-07-15: qty 6.7

## 2011-07-15 MED ORDER — IPRATROPIUM BROMIDE 0.02 % IN SOLN
RESPIRATORY_TRACT | Status: AC
  Administered 2011-07-15: 0.5 mg via RESPIRATORY_TRACT
  Filled 2011-07-15: qty 2.5

## 2011-07-15 MED ORDER — ALBUTEROL SULFATE (5 MG/ML) 0.5% IN NEBU
INHALATION_SOLUTION | RESPIRATORY_TRACT | Status: AC
  Administered 2011-07-15: 5 mg
  Filled 2011-07-15: qty 1

## 2011-07-15 MED ORDER — IPRATROPIUM BROMIDE 0.02 % IN SOLN
0.5000 mg | Freq: Once | RESPIRATORY_TRACT | Status: AC
  Administered 2011-07-15: 0.5 mg via RESPIRATORY_TRACT

## 2011-07-15 MED ORDER — ALBUTEROL SULFATE (5 MG/ML) 0.5% IN NEBU
INHALATION_SOLUTION | RESPIRATORY_TRACT | Status: AC
  Administered 2011-07-15: 5 mg via RESPIRATORY_TRACT
  Filled 2011-07-15: qty 1

## 2011-07-15 NOTE — ED Notes (Signed)
Pt presents to ED today with [facility] that began suddenly tonight.  Pt has hx of same.  Pt tried inhaler but states "it was empty"

## 2011-07-15 NOTE — ED Notes (Signed)
Left lungs clear at d/c with right lower lobe still diminished on exam. Pt. States he feels much better and breathing easier. resp even and unlabored. O2 sats 95-96% on RA.

## 2011-07-15 NOTE — ED Provider Notes (Addendum)
History     CSN: [id number]  Arrival date & time 07/15/11  0225   First MD Initiated Contact with Patient 07/15/11 0234      Chief Complaint  Patient presents with  . Shortness of Breath    (Consider location/radiation/quality/duration/timing/severity/associated sxs/prior treatment) HPI Is a 51 year old black male with end-stage renal disease on hemodialysis. He was dialyzed yesterday. He will from sleep this morning acutely short of breath. He states he feels like he is in need of albuterol and his inhaler is empty. He does not feel like he needs dialysis. He states he had a right pleural effusion drained a month or so ago. He denies chest pain, nausea or vomiting. He has left-sided weakness due to previous stroke. The symptoms are moderate, worse spine. He was given albuterol and Atrovent neb treatment by respiratory therapy on arrival with improvement in his symptoms.  Past Medical History  Diagnosis Date  . Hypertension   . Renal insufficiency   . Anemia   . Stroke     Past Surgical History  Procedure Date  . Kidney transplant     2011 rejected kidney 2012 back on dialysis  . Av fistula placement     History reviewed. No pertinent family history.  History  Substance Use Topics  . Smoking status: Current Everyday Smoker  . Smokeless tobacco: Not on file  . Alcohol Use: No      Review of Systems  All other systems reviewed and are negative.    Allergies  Codeine  Home Medications   Current Outpatient Rx  Name Route Sig Dispense Refill  . AMLODIPINE BESYLATE 10 MG PO TABS Oral Take 1 tablet (10 mg total) by mouth at bedtime. 30 tablet 0  . ASPIRIN EC 81 MG PO TBEC Oral Take 81 mg by mouth daily.    Marland Kitchen CALCIUM CARBONATE ANTACID 500 MG PO CHEW Oral Chew 2 tablets (400 mg of elemental calcium total) by mouth 3 (three) times daily with meals.    Marland Kitchen CINACALCET HCL 30 MG PO TABS Oral Take 30 mg by mouth daily.    Marland Kitchen CLONIDINE HCL 0.3 MG/24HR TD PTWK Transdermal  Place 1 patch onto the skin once a week. Change on Tuesday    . DARBEPOETIN ALFA-POLYSORBATE 25 MCG/0.42ML IJ SOLN Intravenous Inject 0.42 mLs (25 mcg total) into the vein every Tuesday with hemodialysis. 14.28 mL   . LABETALOL HCL 300 MG PO TABS Oral Take 150 mg by mouth 2 (two) times daily.    Marland Kitchen RENA-VITE PO TABS Oral Take 1 tablet by mouth at bedtime. 30 tablet 0  . PARICALCITOL 5 MCG/ML IV SOLN Intravenous Inject 0.4 mLs (2 mcg total) into the vein Every Tuesday,Thursday,and Saturday with dialysis. 1 mL     BP 159/115  Pulse 111  Temp 98.7 F (37.1 C) (Oral)  Resp 22  SpO2 100%  Physical Exam General: Well-developed, well-nourished male in no acute distress; appearance consistent with age of record HENT: normocephalic, atraumatic Eyes: pupils equal round and reactive to light; extraocular muscles intact; arcus senilis bilaterally; left pterygium Neck: supple Heart: regular rate and rhythm Lungs: Decreased air movement right base; no wheezing Abdomen: soft; nondistended Extremities: No deformity; full range of motion; clotted dialysis fistula right forearm; dialysis fistula left upper arm with pulse and thrill Neurologic: Awake, alert and oriented; left hemiparesis Skin: Warm and dry Psychiatric: Normal mood and affect    ED Course  Procedures (including critical care time)     MDM  3:48  AM Patient feels better, air movement significantly improved after neb treatment x2. Patient requests a refill of his inhaler. His primary care physician is planning for him to have an elective pleurodesis on the right to resolve his chronic effusion. Patient's clinical picture not consistent with pneumonia. Patient is due for dialysis tomorrow.  Nursing notes and vitals signs, including pulse oximetry, reviewed.  Imaging Studies: Dg Chest 2 View  07/15/2011  *RADIOLOGY REPORT*  Clinical Data: Shortness of breath.  CHEST - 2 VIEW  Comparison: Chest radiograph performed 06/11/2011   Findings: There is an increasing small to moderate right-sided pleural effusion.  In addition, worsening rounded airspace opacification is noted at the right midlung zone.  This raises suspicion for worsening infection.  More mild left-sided airspace opacification is again noted.  No pneumothorax is identified.  The cardiomediastinal silhouette is borderline enlarged.  No acute osseous abnormalities are identified.  IMPRESSION:  1.  Increasing small to moderate right-sided pleural effusion; worsening rounded airspace opacification at the right mid lung zone.  This raises suspicion for worsening infection.  More mild left-sided airspace disease also seen. 2.  Borderline cardiomegaly.  Original Report Authenticated By: Santa Lighter, M.D.          Wynetta Fines, MD 07/15/11 [place], MD 07/15/11 [phone]

## 2011-07-15 NOTE — ED Notes (Signed)
Returned from xray

## 2011-07-15 NOTE — ED Notes (Signed)
MD at bedside. 

## 2011-07-17 DIAGNOSIS — N039 Chronic nephritic syndrome with unspecified morphologic changes: Secondary | ICD-10-CM | POA: Diagnosis not present

## 2011-07-17 DIAGNOSIS — N2581 Secondary hyperparathyroidism of renal origin: Secondary | ICD-10-CM | POA: Diagnosis not present

## 2011-07-17 DIAGNOSIS — D509 Iron deficiency anemia, unspecified: Secondary | ICD-10-CM | POA: Diagnosis not present

## 2011-07-17 DIAGNOSIS — N186 End stage renal disease: Secondary | ICD-10-CM | POA: Diagnosis not present

## 2011-07-18 ENCOUNTER — Other Ambulatory Visit: Payer: Self-pay | Admitting: Cardiothoracic Surgery

## 2011-07-18 ENCOUNTER — Encounter: Payer: Self-pay | Admitting: Cardiothoracic Surgery

## 2011-07-18 ENCOUNTER — Institutional Professional Consult (permissible substitution) (INDEPENDENT_AMBULATORY_CARE_PROVIDER_SITE_OTHER): Payer: Medicare Other | Admitting: Cardiothoracic Surgery

## 2011-07-18 VITALS — BP 178/90 | HR 105 | Resp 16 | Ht 69.0 in | Wt 155.0 lb

## 2011-07-18 DIAGNOSIS — I10 Essential (primary) hypertension: Secondary | ICD-10-CM

## 2011-07-18 DIAGNOSIS — Z992 Dependence on renal dialysis: Secondary | ICD-10-CM | POA: Diagnosis not present

## 2011-07-18 DIAGNOSIS — N186 End stage renal disease: Secondary | ICD-10-CM | POA: Diagnosis not present

## 2011-07-18 DIAGNOSIS — R011 Cardiac murmur, unspecified: Secondary | ICD-10-CM

## 2011-07-18 DIAGNOSIS — J90 Pleural effusion, not elsewhere classified: Secondary | ICD-10-CM | POA: Diagnosis not present

## 2011-07-18 DIAGNOSIS — R0602 Shortness of breath: Secondary | ICD-10-CM

## 2011-07-18 NOTE — Progress Notes (Signed)
PCP is Windy Kalata, MD Referring Provider is Windy Kalata, MD  Chief Complaint  Patient presents with  . Pleural Effusion    eval and treat...last cxr 07/15/11    HPI: 51 year old black male severe hypertension and renal failure on hemodialysis status post kidney transplant 2 years ago at [facility] which failed presents for evaluation recurrent right pleural effusion and shortness of breath. Patient has had thoracentesis twice most recently last month and previously in 2012. Last thoracentesis returned 570 cc of serosanguineous fluid which by report was cultured negative and cytology negative. The patient smokes. Review of the patient's old chest x-ray shows a right pleural effusion extending back to 2011 2012. The patient denies any cardiac history however he has a loud systolic murmur. Last 2-D echo was 9 years ago with EF of 30-40%. Patient has shortness of breath with exertion. Otherwise he appears be compliant with his medications and his dialysis schedule on Tuesday Thursday Saturday schedule.  He has a AV fistula in his left upper arm   Past Medical History  Diagnosis Date  . Hypertension   . Renal insufficiency   . Anemia   . Stroke     Past Surgical History  Procedure Date  . Kidney transplant     2011 rejected kidney 2012 back on dialysis  . Av fistula placement     No family history on file.  Social History History  Substance Use Topics  . Smoking status: Current Everyday Smoker  . Smokeless tobacco: Not on file  . Alcohol Use: No    Current Outpatient Prescriptions  Medication Sig Dispense Refill  . amLODipine (NORVASC) 10 MG tablet Take 1 tablet (10 mg total) by mouth at bedtime.  30 tablet  0  . aspirin EC 81 MG tablet Take 81 mg by mouth daily.      . calcium carbonate (TUMS - DOSED IN MG ELEMENTAL CALCIUM) 500 MG chewable tablet Chew 2 tablets (400 mg of elemental calcium total) by mouth 3 (three) times daily with meals.      . cinacalcet  (SENSIPAR) 30 MG tablet Take 30 mg by mouth daily.      . cloNIDine (CATAPRES - DOSED IN MG/24 HR) 0.3 mg/24hr Place 1 patch onto the skin once a week. Change on Tuesday      . labetalol (NORMODYNE) 300 MG tablet Take 150 mg by mouth 2 (two) times daily.      . multivitamin (RENA-VIT) TABS tablet Take 1 tablet by mouth at bedtime.  30 tablet  0    Allergies  Allergen Reactions  . Codeine Shortness Of Breath    Review of Systems no fever no night sweats stable weight no ankle edema no angina  BP 178/90  Pulse 105  Resp 16  Ht 5\' 9"  (1.753 m)  Wt 155 lb (70.308 kg)  BMI 22.89 kg/m2  SpO2 91% Physical Exam General chronically ill alert and responsive HEENT normocephalic Neck no JVD Lymphatics no palpable nodes in the neck Cardiac 4/6 holosystolic murmur left lower sternal border some radiation to the left axilla, regular heart rhythm Abdomen nontender Extremities multiple surgical scars over both arms from AV access procedures currently AV fistula left upper arm, no pedal edema Neurologic normal gait no focal deficit   Diagnostic Tests: Previous CT scan was performed over a year ago. Recent chest x-ray shows probable entrapped right lower lobe with recurrent l right pleural effusion, cardiomegaly  Impression: Entrapped right lower lobe with recurrent effusion also some right  lung airspace disease. The patient needs a CT scan in 2-D echo before scheduling right VATS and decortication  Plan: Return in one week to review results of echo and CT scan

## 2011-07-19 DIAGNOSIS — N2581 Secondary hyperparathyroidism of renal origin: Secondary | ICD-10-CM | POA: Diagnosis not present

## 2011-07-19 DIAGNOSIS — D509 Iron deficiency anemia, unspecified: Secondary | ICD-10-CM | POA: Diagnosis not present

## 2011-07-19 DIAGNOSIS — N186 End stage renal disease: Secondary | ICD-10-CM | POA: Diagnosis not present

## 2011-07-19 DIAGNOSIS — N039 Chronic nephritic syndrome with unspecified morphologic changes: Secondary | ICD-10-CM | POA: Diagnosis not present

## 2011-07-21 DIAGNOSIS — D509 Iron deficiency anemia, unspecified: Secondary | ICD-10-CM | POA: Diagnosis not present

## 2011-07-21 DIAGNOSIS — N186 End stage renal disease: Secondary | ICD-10-CM | POA: Diagnosis not present

## 2011-07-21 DIAGNOSIS — N2581 Secondary hyperparathyroidism of renal origin: Secondary | ICD-10-CM | POA: Diagnosis not present

## 2011-07-21 DIAGNOSIS — D631 Anemia in chronic kidney disease: Secondary | ICD-10-CM | POA: Diagnosis not present

## 2011-07-23 ENCOUNTER — Ambulatory Visit (HOSPITAL_COMMUNITY)
Admission: RE | Admit: 2011-07-23 | Discharge: 2011-07-23 | Disposition: A | Discharge status: Home / Self Care | Payer: Medicare Other | Source: Ambulatory Visit | Attending: Cardiothoracic Surgery | Admitting: Cardiothoracic Surgery

## 2011-07-23 DIAGNOSIS — J90 Pleural effusion, not elsewhere classified: Secondary | ICD-10-CM | POA: Diagnosis not present

## 2011-07-23 DIAGNOSIS — R0989 Other specified symptoms and signs involving the circulatory and respiratory systems: Secondary | ICD-10-CM | POA: Diagnosis not present

## 2011-07-23 DIAGNOSIS — I517 Cardiomegaly: Secondary | ICD-10-CM | POA: Diagnosis not present

## 2011-07-23 DIAGNOSIS — R0609 Other forms of dyspnea: Secondary | ICD-10-CM | POA: Diagnosis not present

## 2011-07-23 DIAGNOSIS — R05 Cough: Secondary | ICD-10-CM | POA: Diagnosis not present

## 2011-07-23 DIAGNOSIS — R0602 Shortness of breath: Secondary | ICD-10-CM | POA: Insufficient documentation

## 2011-07-24 DIAGNOSIS — N186 End stage renal disease: Secondary | ICD-10-CM | POA: Diagnosis not present

## 2011-07-24 DIAGNOSIS — N2581 Secondary hyperparathyroidism of renal origin: Secondary | ICD-10-CM | POA: Diagnosis not present

## 2011-07-24 DIAGNOSIS — D631 Anemia in chronic kidney disease: Secondary | ICD-10-CM | POA: Diagnosis not present

## 2011-07-24 DIAGNOSIS — D509 Iron deficiency anemia, unspecified: Secondary | ICD-10-CM | POA: Diagnosis not present

## 2011-07-25 ENCOUNTER — Encounter: Payer: Self-pay | Admitting: Cardiothoracic Surgery

## 2011-07-25 ENCOUNTER — Other Ambulatory Visit: Payer: Self-pay | Admitting: Cardiothoracic Surgery

## 2011-07-25 ENCOUNTER — Other Ambulatory Visit: Payer: Self-pay

## 2011-07-25 ENCOUNTER — Ambulatory Visit (INDEPENDENT_AMBULATORY_CARE_PROVIDER_SITE_OTHER): Payer: Medicare Other | Admitting: Cardiothoracic Surgery

## 2011-07-25 VITALS — BP 152/80 | HR 96 | Resp 20 | Ht 69.0 in | Wt 164.0 lb

## 2011-07-25 DIAGNOSIS — I517 Cardiomegaly: Secondary | ICD-10-CM | POA: Diagnosis not present

## 2011-07-25 DIAGNOSIS — J90 Pleural effusion, not elsewhere classified: Secondary | ICD-10-CM

## 2011-07-25 DIAGNOSIS — R011 Cardiac murmur, unspecified: Secondary | ICD-10-CM | POA: Diagnosis not present

## 2011-07-25 DIAGNOSIS — I340 Nonrheumatic mitral (valve) insufficiency: Secondary | ICD-10-CM

## 2011-07-25 DIAGNOSIS — N186 End stage renal disease: Secondary | ICD-10-CM

## 2011-07-25 DIAGNOSIS — T8611 Kidney transplant rejection: Secondary | ICD-10-CM

## 2011-07-25 DIAGNOSIS — I059 Rheumatic mitral valve disease, unspecified: Secondary | ICD-10-CM

## 2011-07-25 DIAGNOSIS — I10 Essential (primary) hypertension: Secondary | ICD-10-CM

## 2011-07-25 DIAGNOSIS — I519 Heart disease, unspecified: Secondary | ICD-10-CM

## 2011-07-25 DIAGNOSIS — D381 Neoplasm of uncertain behavior of trachea, bronchus and lung: Secondary | ICD-10-CM

## 2011-07-25 DIAGNOSIS — T8610 Unspecified complication of kidney transplant: Secondary | ICD-10-CM

## 2011-07-25 NOTE — Progress Notes (Signed)
PCP is Windy Kalata, MD Referring Provider is Windy Kalata, MD  Chief Complaint  Patient presents with  . Follow-up    1 week f/u discuss Chest CT and 2D Echo results    HPI: 51 year old Afro-American male hypertensive smoker with recurrent right pleural effusion requiring thoracentesis x2, CT scan shows entrapped right lung  Chronic hypertension Status post kidney transplant 2 years ago now nonfunctional back on dialysis Chronic tobacco abuse History right lung pneumonia, resolved Moderate LV dysfunction EF 30% with moderate mitral regurgitation Diet controlled diabetes mellitus History of left body stroke from probable hypertension  Past Medical History  Diagnosis Date  . Hypertension   . Renal insufficiency   . Anemia   . Stroke     Past Surgical History  Procedure Date  . Kidney transplant     2011 rejected kidney 2012 back on dialysis  . Av fistula placement     No family history on file.  Social History History  Substance Use Topics  . Smoking status: Current Everyday Smoker  . Smokeless tobacco: Not on file  . Alcohol Use: No    Current Outpatient Prescriptions  Medication Sig Dispense Refill  . amLODipine (NORVASC) 10 MG tablet Take 1 tablet (10 mg total) by mouth at bedtime.  30 tablet  0  . aspirin EC 81 MG tablet Take 81 mg by mouth daily.      . calcium carbonate (TUMS - DOSED IN MG ELEMENTAL CALCIUM) 500 MG chewable tablet Chew 2 tablets (400 mg of elemental calcium total) by mouth 3 (three) times daily with meals.      . cinacalcet (SENSIPAR) 30 MG tablet Take 30 mg by mouth daily.      . cloNIDine (CATAPRES - DOSED IN MG/24 HR) 0.3 mg/24hr Place 1 patch onto the skin once a week. Change on Tuesday      . labetalol (NORMODYNE) 300 MG tablet Take 150 mg by mouth 2 (two) times daily.      . multivitamin (RENA-VIT) TABS tablet Take 1 tablet by mouth at bedtime.  30 tablet  0    Allergies  Allergen Reactions  . Codeine Shortness Of  Breath    Review of Systems feels better after resolution of right pneumonia, tolerating hemodialysis well Tuesday Thursday Saturday schedule                                 Smoking less than 5 cigarettes per day                                  No history of angina - he states his stress test, perfusion scan was normal prior to his kidney transplant 2 years ago                                  No history of thoracic trauma no bleeding problems  BP 152/80  Pulse 96  Resp 20  Ht 5\' 9"  (1.753 m)  Wt 164 lb (74.39 kg)  BMI 24.22 kg/m2  SpO2 94% Physical Exam General alert and comfortable HEENT normal cephalic muddy sclera  Neck no JVD mass Thorax diminished breath sounds on the right no tenderness or chest wall deformity Cardiac regular rhythm 2/6 hole systolic murmur to left axilla Abdomen surgical scar on right  from prior renal allograft surgery, nontender no pulsatile mass Extremities multiple vascular access incisions both upper extremities, functional AV graft left upper arm Neurologic weak on left side from old stroke, able to ambulate without pain   Diagnostic Tests: 2-D echocardiogram reviewed. This demonstrates global LV dysfunction EF 30%. This demonstrates calcified mitral regurgitation moderate, posteriorly directed CT scan of the chest demonstrates resolution of airspace disease but enhancing right pleural fluid-tissue which entraps the right lung to a moderate extent  Impression: Recurrent right pleural effusion, CT documented right lung entrapment. The patient would benefit from right V a TS and decortication of right lung. His cool morbid renal and cardiac problems would slightly increased risk of surgery however due to the patient's young age and documented recurrent symptomatic pleural effusion I feel that decortication and possible talc pleurodesis would be indicated. The patient understands the indications benefits risks and details of surgery as well as expected  hospital recovery.  Plan:Right thoracotomy decortication of right lung August 2 at [facility] hospital.

## 2011-07-25 NOTE — Patient Instructions (Signed)
Your right lung surgery will be scheduled for Friday, August 2 at [facility] hospital. Take your blood pressure pills the morning of surgery with sip of water.

## 2011-07-26 ENCOUNTER — Encounter (HOSPITAL_COMMUNITY): Payer: Self-pay | Admitting: Pharmacy Technician

## 2011-07-26 DIAGNOSIS — N2581 Secondary hyperparathyroidism of renal origin: Secondary | ICD-10-CM | POA: Diagnosis not present

## 2011-07-26 DIAGNOSIS — D509 Iron deficiency anemia, unspecified: Secondary | ICD-10-CM | POA: Diagnosis not present

## 2011-07-26 DIAGNOSIS — N186 End stage renal disease: Secondary | ICD-10-CM | POA: Diagnosis not present

## 2011-07-26 DIAGNOSIS — D631 Anemia in chronic kidney disease: Secondary | ICD-10-CM | POA: Diagnosis not present

## 2011-07-28 DIAGNOSIS — D631 Anemia in chronic kidney disease: Secondary | ICD-10-CM | POA: Diagnosis not present

## 2011-07-28 DIAGNOSIS — N2581 Secondary hyperparathyroidism of renal origin: Secondary | ICD-10-CM | POA: Diagnosis not present

## 2011-07-28 DIAGNOSIS — N186 End stage renal disease: Secondary | ICD-10-CM | POA: Diagnosis not present

## 2011-07-28 DIAGNOSIS — D509 Iron deficiency anemia, unspecified: Secondary | ICD-10-CM | POA: Diagnosis not present

## 2011-07-31 DIAGNOSIS — D509 Iron deficiency anemia, unspecified: Secondary | ICD-10-CM | POA: Diagnosis not present

## 2011-07-31 DIAGNOSIS — N186 End stage renal disease: Secondary | ICD-10-CM | POA: Diagnosis not present

## 2011-07-31 DIAGNOSIS — D631 Anemia in chronic kidney disease: Secondary | ICD-10-CM | POA: Diagnosis not present

## 2011-07-31 DIAGNOSIS — N2581 Secondary hyperparathyroidism of renal origin: Secondary | ICD-10-CM | POA: Diagnosis not present

## 2011-08-02 DIAGNOSIS — N2581 Secondary hyperparathyroidism of renal origin: Secondary | ICD-10-CM | POA: Diagnosis not present

## 2011-08-02 DIAGNOSIS — N039 Chronic nephritic syndrome with unspecified morphologic changes: Secondary | ICD-10-CM | POA: Diagnosis not present

## 2011-08-02 DIAGNOSIS — E119 Type 2 diabetes mellitus without complications: Secondary | ICD-10-CM | POA: Diagnosis not present

## 2011-08-02 DIAGNOSIS — N186 End stage renal disease: Secondary | ICD-10-CM | POA: Diagnosis not present

## 2011-08-02 DIAGNOSIS — D509 Iron deficiency anemia, unspecified: Secondary | ICD-10-CM | POA: Diagnosis not present

## 2011-08-04 DIAGNOSIS — D631 Anemia in chronic kidney disease: Secondary | ICD-10-CM | POA: Diagnosis not present

## 2011-08-04 DIAGNOSIS — D509 Iron deficiency anemia, unspecified: Secondary | ICD-10-CM | POA: Diagnosis not present

## 2011-08-04 DIAGNOSIS — N186 End stage renal disease: Secondary | ICD-10-CM | POA: Diagnosis not present

## 2011-08-04 DIAGNOSIS — N2581 Secondary hyperparathyroidism of renal origin: Secondary | ICD-10-CM | POA: Diagnosis not present

## 2011-08-07 DIAGNOSIS — D509 Iron deficiency anemia, unspecified: Secondary | ICD-10-CM | POA: Diagnosis not present

## 2011-08-07 DIAGNOSIS — N039 Chronic nephritic syndrome with unspecified morphologic changes: Secondary | ICD-10-CM | POA: Diagnosis not present

## 2011-08-07 DIAGNOSIS — N2581 Secondary hyperparathyroidism of renal origin: Secondary | ICD-10-CM | POA: Diagnosis not present

## 2011-08-07 DIAGNOSIS — N186 End stage renal disease: Secondary | ICD-10-CM | POA: Diagnosis not present

## 2011-08-08 ENCOUNTER — Other Ambulatory Visit: Payer: Self-pay

## 2011-08-08 ENCOUNTER — Ambulatory Visit (HOSPITAL_COMMUNITY)
Admission: RE | Admit: 2011-08-08 | Discharge: 2011-08-08 | Disposition: A | Discharge status: Home / Self Care | Payer: Medicare Other | Source: Ambulatory Visit | Attending: Cardiothoracic Surgery | Admitting: Cardiothoracic Surgery

## 2011-08-08 ENCOUNTER — Encounter (HOSPITAL_COMMUNITY): Payer: Self-pay

## 2011-08-08 ENCOUNTER — Other Ambulatory Visit (HOSPITAL_COMMUNITY): Payer: Self-pay | Admitting: Radiology

## 2011-08-08 ENCOUNTER — Encounter (HOSPITAL_COMMUNITY)
Admission: RE | Admit: 2011-08-08 | Discharge: 2011-08-08 | Disposition: A | Discharge status: Home / Self Care | Payer: Medicare Other | Source: Ambulatory Visit | Attending: Cardiothoracic Surgery | Admitting: Cardiothoracic Surgery

## 2011-08-08 VITALS — BP 172/98 | HR 105 | Temp 98.6°F | Resp 20 | Ht 69.0 in | Wt 165.2 lb

## 2011-08-08 DIAGNOSIS — J90 Pleural effusion, not elsewhere classified: Secondary | ICD-10-CM | POA: Insufficient documentation

## 2011-08-08 DIAGNOSIS — D381 Neoplasm of uncertain behavior of trachea, bronchus and lung: Secondary | ICD-10-CM

## 2011-08-08 DIAGNOSIS — Z01811 Encounter for preprocedural respiratory examination: Secondary | ICD-10-CM | POA: Diagnosis not present

## 2011-08-08 DIAGNOSIS — Z01818 Encounter for other preprocedural examination: Secondary | ICD-10-CM | POA: Insufficient documentation

## 2011-08-08 DIAGNOSIS — N186 End stage renal disease: Secondary | ICD-10-CM | POA: Diagnosis not present

## 2011-08-08 DIAGNOSIS — R05 Cough: Secondary | ICD-10-CM | POA: Diagnosis not present

## 2011-08-08 DIAGNOSIS — Z01812 Encounter for preprocedural laboratory examination: Secondary | ICD-10-CM | POA: Insufficient documentation

## 2011-08-08 HISTORY — DX: Hemiplegia and hemiparesis following cerebral infarction affecting unspecified side: I69.359

## 2011-08-08 LAB — BLOOD GAS, ARTERIAL
Acid-Base Excess: 6 mmol/L — ABNORMAL HIGH (ref 0.0–2.0)
Bicarbonate: 29.9 mEq/L — ABNORMAL HIGH (ref 20.0–24.0)
Drawn by: 181601
FIO2: 0.21 %
O2 Saturation: 90.9 %
Patient temperature: 98.6
TCO2: 31.2 mmol/L (ref 0–100)
pCO2 arterial: 42.5 mmHg (ref 35.0–45.0)
pH, Arterial: 7.461 — ABNORMAL HIGH (ref 7.350–7.450)
pO2, Arterial: 63.6 mmHg — ABNORMAL LOW (ref 80.0–100.0)

## 2011-08-08 LAB — CBC
HCT: 34.9 % — ABNORMAL LOW (ref 39.0–52.0)
Hemoglobin: 11.1 g/dL — ABNORMAL LOW (ref 13.0–17.0)
MCH: 28.2 pg (ref 26.0–34.0)
MCHC: 31.8 g/dL (ref 30.0–36.0)
MCV: 88.6 fL (ref 78.0–100.0)
Platelets: 142 10*3/uL — ABNORMAL LOW (ref 150–400)
RBC: 3.94 MIL/uL — ABNORMAL LOW (ref 4.22–5.81)
RDW: 18.1 % — ABNORMAL HIGH (ref 11.5–15.5)
WBC: 4.4 10*3/uL (ref 4.0–10.5)

## 2011-08-08 LAB — PULMONARY FUNCTION TEST

## 2011-08-08 LAB — COMPREHENSIVE METABOLIC PANEL
ALT: 10 U/L (ref 0–53)
AST: 18 U/L (ref 0–37)
Albumin: 2.9 g/dL — ABNORMAL LOW (ref 3.5–5.2)
Alkaline Phosphatase: 65 U/L (ref 39–117)
BUN: 43 mg/dL — ABNORMAL HIGH (ref 6–23)
CO2: 27 mEq/L (ref 19–32)
Calcium: 9.8 mg/dL (ref 8.4–10.5)
Chloride: 98 mEq/L (ref 96–112)
Creatinine, Ser: 9.1 mg/dL — ABNORMAL HIGH (ref 0.50–1.35)
GFR calc Af Amer: 7 mL/min — ABNORMAL LOW (ref >90)
GFR calc non Af Amer: 6 mL/min — ABNORMAL LOW (ref >90)
Glucose, Bld: 102 mg/dL — ABNORMAL HIGH (ref 70–99)
Potassium: 4.4 mEq/L (ref 3.5–5.1)
Sodium: 140 mEq/L (ref 135–145)
Total Bilirubin: 0.3 mg/dL (ref 0.3–1.2)
Total Protein: 7.3 g/dL (ref 6.0–8.3)

## 2011-08-08 LAB — SURGICAL PCR SCREEN
MRSA, PCR: NEGATIVE
Staphylococcus aureus: NEGATIVE

## 2011-08-08 LAB — TYPE AND SCREEN
ABO/RH(D): A POS
Antibody Screen: NEGATIVE

## 2011-08-08 LAB — PROTIME-INR
INR: 1.02 (ref 0.00–1.49)
Prothrombin Time: 13.6 seconds (ref 11.6–15.2)

## 2011-08-08 LAB — APTT: aPTT: 34 seconds (ref 24–37)

## 2011-08-08 MED ORDER — ALBUTEROL SULFATE (5 MG/ML) 0.5% IN NEBU
2.5000 mg | INHALATION_SOLUTION | Freq: Once | RESPIRATORY_TRACT | Status: AC
  Administered 2011-08-08: 2.5 mg via RESPIRATORY_TRACT

## 2011-08-08 NOTE — Progress Notes (Signed)
Pt with CRI not able to produce urine sample today will try DOS

## 2011-08-08 NOTE — Pre-Procedure Instructions (Addendum)
[place]  08/08/2011   Your procedure is scheduled on:  August 10, 2011 (Friday)  Report to [place] at 5:30 AM.  Call this number if you have problems the morning of surgery: [phone]   Remember:   Do not eat food:After Midnight.    Take these medicines the morning of surgery with A SIP OF WATER: Norvasc(amlodipine), Normodyne(labetalol)   Do not wear jewelry, make-up or nail polish.  Do not wear lotions, powders, or perfumes. You may wear deodorant.  Do not shave 48 hours prior to surgery. Men may shave face and neck.  Do not bring valuables to the hospital.  Contacts, dentures or bridgework may not be worn into surgery.  Leave suitcase in the car. After surgery it may be brought to your room.  For patients admitted to the hospital, checkout time is 11:00 AM the day of discharge.   Special Instructions: CHG Shower Use Special Wash: 1/2 bottle night before surgery and 1/2 bottle morning of surgery.   Please read over the following fact sheets that you were given: Pain Booklet, Coughing and Deep Breathing, Blood Transfusion Information and Surgical Site Infection Prevention

## 2011-08-09 ENCOUNTER — Encounter (HOSPITAL_COMMUNITY): Payer: Self-pay | Admitting: Vascular Surgery

## 2011-08-09 DIAGNOSIS — N2581 Secondary hyperparathyroidism of renal origin: Secondary | ICD-10-CM | POA: Diagnosis not present

## 2011-08-09 DIAGNOSIS — N186 End stage renal disease: Secondary | ICD-10-CM | POA: Diagnosis not present

## 2011-08-09 DIAGNOSIS — D509 Iron deficiency anemia, unspecified: Secondary | ICD-10-CM | POA: Diagnosis not present

## 2011-08-09 DIAGNOSIS — D631 Anemia in chronic kidney disease: Secondary | ICD-10-CM | POA: Diagnosis not present

## 2011-08-09 DIAGNOSIS — N039 Chronic nephritic syndrome with unspecified morphologic changes: Secondary | ICD-10-CM | POA: Diagnosis not present

## 2011-08-09 MED ORDER — DEXTROSE 5 % IV SOLN
1.5000 g | INTRAVENOUS | Status: AC
  Administered 2011-08-10: 1.5 g via INTRAVENOUS
  Filled 2011-08-09: qty 1.5

## 2011-08-09 NOTE — Progress Notes (Signed)
Pt notified of time change;to arrive @ 0730;instructed to be NPO after midnight with exception of sip of water with meds instructed to take

## 2011-08-09 NOTE — Consult Note (Addendum)
Anesthesia Chart Review:  Patient is a 51 year old male scheduled for right VATS/decorication for recurrent pleural effusion on 08/10/11 by Dr. Prescott Gum.  History includes ESRD with now non-functioning kidney transplant '11 ([facility]) and on dialysis TTS, HTN, anemia, CVA, smoking, moderate LV dysfunction with EF 30% and moderate MR by echo 07/23/11.  Dr. Lucianne Lei Trigt's note also mentions diet controlled DM2.  BP was elevated at 172/98 on 08/08/11.  He is due for HD today.  Nephrologist is Dr. Mercy Moore.  Dr. Prescott Gum has reviewed his echo and although he feels the patient's co-morbidities of "renal and cardiac problems would slightly increased risk of surgery however due to the patient's young age and documented recurrent symptomatic pleural effusion I feel that decortication and possible talc pleurodesis would be indicated."  Echo on 07/23/11 showed: - Left ventricle: The cavity size was normal. Systolic function was severely reduced. The estimated ejection fraction was in the range of 25% to 30%. Diffuse/ generalized hypokinesis of severe degree. Doppler parameters are consistent with a reversible restrictive pattern, indicative of decreased left ventricular diastolic compliance and/or increased left atrial pressure (grade 3 diastolic dysfunction). - Mitral valve: Moderately calcified annulus. Moderate to severe regurgitation directed centrally. - Left atrium: The atrium was mildly to moderately dilated. - Right ventricle: The cavity size was mildly dilated. - Right atrium: The atrium was mildly dilated. - Pulmonary arteries: Systolic pressure was moderately to severely increased. PA peak pressure: 61mm Hg (S). (Previous echo in February 2012 showed EF 55-60% and mild MR.)  EKG from 08/08/11 showed Sr with occasional PVC, LAE, incomplete left BBB, LVH, lateral ST/T wave abnormality consider ischemia or strain pattern.  Overall, I think his EKG is stable since at least December 2011.  He had a cardiac  cath at [facility] in [place] on 05/17/06 that showed normal coronaries, right dominant, normal LV systolic function.  CXR on 08/08/11 showed: Again noted small to moderate partially loculated right pleural effusion with right lower lobe atelectasis or chronic infiltrate. No pulmonary edema.  Labs notes.  ABG showed pH 7.46, pCO2 42.5, pO2 63.6.  BUN/Cr elevated as expected.  H/H 11.1/34.9, PLT 142.  Coags WNL.  Glucose 102.  ISTAT on the day of surgery.   He does have worsening MR and new LV dysfunction since 02/2100, however, Dr. Prescott Gum has reviewed the report and still feels surgical intervention is indicated for patient's symptomatic pleural effusion.  He or Dr. Mercy Moore can determine cardiac testing follow-up post-operatively.  Reviewed with Anesthesiologist Dr. Tamala Julian.  Myra Gianotti, PA-C

## 2011-08-10 ENCOUNTER — Encounter (HOSPITAL_COMMUNITY): Payer: Self-pay | Admitting: *Deleted

## 2011-08-10 ENCOUNTER — Encounter (HOSPITAL_COMMUNITY)
Admission: RE | Disposition: A | Discharge status: Home Health | Payer: Self-pay | Source: Ambulatory Visit | Attending: Cardiothoracic Surgery

## 2011-08-10 ENCOUNTER — Inpatient Hospital Stay (HOSPITAL_COMMUNITY)
Admission: RE | Admit: 2011-08-10 | Discharge: 2011-08-19 | DRG: 163 | Disposition: A | Discharge status: Home Health | Payer: Medicare Other | Source: Ambulatory Visit | Attending: Cardiothoracic Surgery | Admitting: Cardiothoracic Surgery

## 2011-08-10 ENCOUNTER — Inpatient Hospital Stay (HOSPITAL_COMMUNITY): Payer: Medicare Other

## 2011-08-10 ENCOUNTER — Inpatient Hospital Stay (HOSPITAL_COMMUNITY): Payer: Medicare Other | Admitting: Vascular Surgery

## 2011-08-10 ENCOUNTER — Encounter: Payer: Self-pay | Admitting: Cardiothoracic Surgery

## 2011-08-10 ENCOUNTER — Encounter (HOSPITAL_COMMUNITY): Payer: Self-pay | Admitting: Vascular Surgery

## 2011-08-10 DIAGNOSIS — R0602 Shortness of breath: Secondary | ICD-10-CM

## 2011-08-10 DIAGNOSIS — N039 Chronic nephritic syndrome with unspecified morphologic changes: Secondary | ICD-10-CM | POA: Diagnosis not present

## 2011-08-10 DIAGNOSIS — R4182 Altered mental status, unspecified: Secondary | ICD-10-CM | POA: Diagnosis not present

## 2011-08-10 DIAGNOSIS — I359 Nonrheumatic aortic valve disorder, unspecified: Secondary | ICD-10-CM | POA: Diagnosis present

## 2011-08-10 DIAGNOSIS — J869 Pyothorax without fistula: Secondary | ICD-10-CM | POA: Diagnosis not present

## 2011-08-10 DIAGNOSIS — I6340 Cerebral infarction due to embolism of unspecified cerebral artery: Secondary | ICD-10-CM | POA: Diagnosis not present

## 2011-08-10 DIAGNOSIS — I120 Hypertensive chronic kidney disease with stage 5 chronic kidney disease or end stage renal disease: Secondary | ICD-10-CM | POA: Diagnosis not present

## 2011-08-10 DIAGNOSIS — I69959 Hemiplegia and hemiparesis following unspecified cerebrovascular disease affecting unspecified side: Secondary | ICD-10-CM | POA: Diagnosis not present

## 2011-08-10 DIAGNOSIS — N2581 Secondary hyperparathyroidism of renal origin: Secondary | ICD-10-CM | POA: Diagnosis present

## 2011-08-10 DIAGNOSIS — Z992 Dependence on renal dialysis: Secondary | ICD-10-CM | POA: Diagnosis not present

## 2011-08-10 DIAGNOSIS — R5381 Other malaise: Secondary | ICD-10-CM | POA: Diagnosis present

## 2011-08-10 DIAGNOSIS — N186 End stage renal disease: Secondary | ICD-10-CM | POA: Diagnosis not present

## 2011-08-10 DIAGNOSIS — J90 Pleural effusion, not elsewhere classified: Secondary | ICD-10-CM

## 2011-08-10 DIAGNOSIS — R091 Pleurisy: Secondary | ICD-10-CM | POA: Diagnosis not present

## 2011-08-10 DIAGNOSIS — I517 Cardiomegaly: Secondary | ICD-10-CM | POA: Diagnosis not present

## 2011-08-10 DIAGNOSIS — D649 Anemia, unspecified: Secondary | ICD-10-CM | POA: Diagnosis not present

## 2011-08-10 DIAGNOSIS — I6789 Other cerebrovascular disease: Secondary | ICD-10-CM | POA: Diagnosis not present

## 2011-08-10 DIAGNOSIS — I10 Essential (primary) hypertension: Secondary | ICD-10-CM | POA: Diagnosis not present

## 2011-08-10 DIAGNOSIS — R404 Transient alteration of awareness: Secondary | ICD-10-CM | POA: Diagnosis not present

## 2011-08-10 DIAGNOSIS — R079 Chest pain, unspecified: Secondary | ICD-10-CM | POA: Diagnosis not present

## 2011-08-10 DIAGNOSIS — J9819 Other pulmonary collapse: Secondary | ICD-10-CM | POA: Diagnosis not present

## 2011-08-10 DIAGNOSIS — Z09 Encounter for follow-up examination after completed treatment for conditions other than malignant neoplasm: Secondary | ICD-10-CM | POA: Diagnosis not present

## 2011-08-10 DIAGNOSIS — I6350 Cerebral infarction due to unspecified occlusion or stenosis of unspecified cerebral artery: Secondary | ICD-10-CM | POA: Diagnosis not present

## 2011-08-10 DIAGNOSIS — J984 Other disorders of lung: Secondary | ICD-10-CM | POA: Diagnosis not present

## 2011-08-10 DIAGNOSIS — Z8673 Personal history of transient ischemic attack (TIA), and cerebral infarction without residual deficits: Secondary | ICD-10-CM | POA: Diagnosis not present

## 2011-08-10 DIAGNOSIS — R918 Other nonspecific abnormal finding of lung field: Secondary | ICD-10-CM | POA: Diagnosis not present

## 2011-08-10 HISTORY — PX: VIDEO ASSISTED THORACOSCOPY (VATS)/DECORTICATION: SHX6171

## 2011-08-10 HISTORY — DX: Shortness of breath: R06.02

## 2011-08-10 LAB — GLUCOSE, CAPILLARY
Glucose-Capillary: 97 mg/dL (ref 70–99)
Glucose-Capillary: 96 mg/dL (ref 70–99)

## 2011-08-10 LAB — POCT I-STAT 4, (NA,K, GLUC, HGB,HCT)
Glucose, Bld: 95 mg/dL (ref 70–99)
HCT: 39 % (ref 39.0–52.0)
Hemoglobin: 13.3 g/dL (ref 13.0–17.0)
Potassium: 3.9 mEq/L (ref 3.5–5.1)
Sodium: 137 mEq/L (ref 135–145)

## 2011-08-10 SURGERY — VIDEO ASSISTED THORACOSCOPY (VATS)/DECORTICATION
Anesthesia: General | Site: Chest | Laterality: Right

## 2011-08-10 MED ORDER — CLONIDINE HCL 0.3 MG/24HR TD PTWK
0.3000 mg | MEDICATED_PATCH | TRANSDERMAL | Status: DC
  Administered 2011-08-14: 0.3 mg via TRANSDERMAL
  Filled 2011-08-10: qty 1

## 2011-08-10 MED ORDER — SODIUM CHLORIDE 0.9 % IR SOLN
Status: DC | PRN
  Administered 2011-08-10: 1000 mL

## 2011-08-10 MED ORDER — FENTANYL 10 MCG/ML IV SOLN
INTRAVENOUS | Status: DC
  Administered 2011-08-10: 16:00:00 via INTRAVENOUS
  Administered 2011-08-11: 50 ug via INTRAVENOUS
  Administered 2011-08-11: 30 ug via INTRAVENOUS
  Administered 2011-08-11: 50 ug via INTRAVENOUS
  Administered 2011-08-11: 10 ug via INTRAVENOUS
  Administered 2011-08-11: 20 ug via INTRAVENOUS
  Administered 2011-08-12: 90 ug via INTRAVENOUS
  Administered 2011-08-12: 160 ug via INTRAVENOUS
  Administered 2011-08-12: 08:00:00 via INTRAVENOUS
  Administered 2011-08-12: 30 ug via INTRAVENOUS
  Administered 2011-08-12: 20 ug via INTRAVENOUS
  Administered 2011-08-12: 12:00:00 via INTRAVENOUS
  Filled 2011-08-10 (×2): qty 50

## 2011-08-10 MED ORDER — LABETALOL HCL 300 MG PO TABS
150.0000 mg | ORAL_TABLET | Freq: Two times a day (BID) | ORAL | Status: DC
  Administered 2011-08-11 – 2011-08-18 (×14): 150 mg via ORAL
  Filled 2011-08-10 (×20): qty 0.5

## 2011-08-10 MED ORDER — SODIUM CHLORIDE 0.9 % IJ SOLN
9.0000 mL | INTRAMUSCULAR | Status: DC | PRN
  Administered 2011-08-11: 3 mL via INTRAVENOUS
  Administered 2011-08-11: 10 mL via INTRAVENOUS

## 2011-08-10 MED ORDER — BUPIVACAINE 0.5 % ON-Q PUMP SINGLE CATH 400 ML
INJECTION | Status: DC | PRN
  Administered 2011-08-10: 400 mL

## 2011-08-10 MED ORDER — TRAMADOL HCL 50 MG PO TABS
50.0000 mg | ORAL_TABLET | Freq: Four times a day (QID) | ORAL | Status: DC | PRN
  Administered 2011-08-12 – 2011-08-19 (×9): 100 mg via ORAL
  Filled 2011-08-10 (×9): qty 2

## 2011-08-10 MED ORDER — ONDANSETRON HCL 4 MG/2ML IJ SOLN
INTRAMUSCULAR | Status: DC | PRN
  Administered 2011-08-10: 4 mg via INTRAVENOUS

## 2011-08-10 MED ORDER — BISACODYL 5 MG PO TBEC
10.0000 mg | DELAYED_RELEASE_TABLET | Freq: Every day | ORAL | Status: DC
  Administered 2011-08-11 – 2011-08-19 (×5): 10 mg via ORAL
  Filled 2011-08-10 (×7): qty 2

## 2011-08-10 MED ORDER — FENTANYL CITRATE 0.05 MG/ML IJ SOLN: INTRAMUSCULAR | Status: DC | PRN

## 2011-08-10 MED ORDER — DEXTROSE 5 % IV SOLN
1.5000 g | Freq: Two times a day (BID) | INTRAVENOUS | Status: AC
  Administered 2011-08-10 – 2011-08-11 (×2): 1.5 g via INTRAVENOUS
  Filled 2011-08-10 (×2): qty 1.5

## 2011-08-10 MED ORDER — DIPHENHYDRAMINE HCL 12.5 MG/5ML PO ELIX
12.5000 mg | ORAL_SOLUTION | Freq: Four times a day (QID) | ORAL | Status: DC | PRN
  Filled 2011-08-10: qty 5

## 2011-08-10 MED ORDER — FENTANYL CITRATE 0.05 MG/ML IJ SOLN
INTRAMUSCULAR | Status: AC
  Filled 2011-08-10: qty 2

## 2011-08-10 MED ORDER — LIDOCAINE HCL (CARDIAC) 20 MG/ML IV SOLN
INTRAVENOUS | Status: DC | PRN
  Administered 2011-08-10: 50 mg via INTRAVENOUS

## 2011-08-10 MED ORDER — MIDAZOLAM HCL 2 MG/2ML IJ SOLN
1.0000 mg | INTRAMUSCULAR | Status: DC | PRN
  Administered 2011-08-10: 1 mg via INTRAVENOUS

## 2011-08-10 MED ORDER — AMLODIPINE BESYLATE 10 MG PO TABS
10.0000 mg | ORAL_TABLET | Freq: Every day | ORAL | Status: DC
  Administered 2011-08-11 – 2011-08-18 (×8): 10 mg via ORAL
  Filled 2011-08-10 (×10): qty 1

## 2011-08-10 MED ORDER — POTASSIUM CHLORIDE 10 MEQ/50ML IV SOLN
10.0000 meq | Freq: Every day | INTRAVENOUS | Status: DC | PRN
  Filled 2011-08-10: qty 50

## 2011-08-10 MED ORDER — CINACALCET HCL 30 MG PO TABS
30.0000 mg | ORAL_TABLET | Freq: Every day | ORAL | Status: DC
  Administered 2011-08-11 – 2011-08-19 (×9): 30 mg via ORAL
  Filled 2011-08-10 (×9): qty 1

## 2011-08-10 MED ORDER — MIDAZOLAM HCL 2 MG/2ML IJ SOLN
INTRAMUSCULAR | Status: AC
  Filled 2011-08-10: qty 2

## 2011-08-10 MED ORDER — ASPIRIN EC 81 MG PO TBEC
81.0000 mg | DELAYED_RELEASE_TABLET | Freq: Every day | ORAL | Status: DC
  Administered 2011-08-11 – 2011-08-13 (×3): 81 mg via ORAL
  Filled 2011-08-10 (×3): qty 1

## 2011-08-10 MED ORDER — FENTANYL CITRATE 0.05 MG/ML IJ SOLN
25.0000 ug | INTRAMUSCULAR | Status: DC | PRN
  Administered 2011-08-10 (×2): 50 ug via INTRAVENOUS

## 2011-08-10 MED ORDER — PROPOFOL 10 MG/ML IV EMUL
INTRAVENOUS | Status: DC | PRN
  Administered 2011-08-10: 200 mg via INTRAVENOUS

## 2011-08-10 MED ORDER — LABETALOL HCL 300 MG PO TABS
150.0000 mg | ORAL_TABLET | Freq: Once | ORAL | Status: AC
  Administered 2011-08-10: 150 mg via ORAL
  Filled 2011-08-10: qty 0.5

## 2011-08-10 MED ORDER — DIPHENHYDRAMINE HCL 50 MG/ML IJ SOLN
12.5000 mg | Freq: Four times a day (QID) | INTRAMUSCULAR | Status: DC | PRN
  Filled 2011-08-10: qty 0.25

## 2011-08-10 MED ORDER — DEXTROSE-NACL 5-0.9 % IV SOLN
INTRAVENOUS | Status: DC
  Administered 2011-08-10: 18:00:00 via INTRAVENOUS
  Administered 2011-08-12: 20 mL via INTRAVENOUS
  Administered 2011-08-13: 21:00:00 via INTRAVENOUS
  Administered 2011-08-15: 20 mL/h via INTRAVENOUS

## 2011-08-10 MED ORDER — SODIUM CHLORIDE 0.9 % IV SOLN
INTRAVENOUS | Status: DC
  Administered 2011-08-10: 10:00:00 via INTRAVENOUS

## 2011-08-10 MED ORDER — SODIUM CHLORIDE 0.9 % IV SOLN
INTRAVENOUS | Status: DC | PRN
  Administered 2011-08-10 (×3): via INTRAVENOUS

## 2011-08-10 MED ORDER — FENTANYL CITRATE 0.05 MG/ML IJ SOLN
INTRAMUSCULAR | Status: DC | PRN
  Administered 2011-08-10: 100 ug via INTRAVENOUS
  Administered 2011-08-10: 50 ug via INTRAVENOUS
  Administered 2011-08-10: 100 ug via INTRAVENOUS
  Administered 2011-08-10: 50 ug via INTRAVENOUS

## 2011-08-10 MED ORDER — FENTANYL CITRATE 0.05 MG/ML IJ SOLN
50.0000 ug | INTRAMUSCULAR | Status: DC | PRN
  Administered 2011-08-10: 75 ug via INTRAVENOUS

## 2011-08-10 MED ORDER — BUPIVACAINE ON-Q PAIN PUMP (FOR ORDER SET NO CHG)
INJECTION | Status: AC
  Filled 2011-08-10: qty 1

## 2011-08-10 MED ORDER — ACETAMINOPHEN 10 MG/ML IV SOLN
1000.0000 mg | Freq: Four times a day (QID) | INTRAVENOUS | Status: AC
  Administered 2011-08-10 – 2011-08-11 (×4): 1000 mg via INTRAVENOUS
  Filled 2011-08-10 (×4): qty 100

## 2011-08-10 MED ORDER — BUPIVACAINE 0.5 % ON-Q PUMP SINGLE CATH 400 ML
400.0000 mL | INJECTION | Status: AC
  Filled 2011-08-10: qty 400

## 2011-08-10 MED ORDER — CALCIUM CARBONATE ANTACID 500 MG PO CHEW
2.0000 | CHEWABLE_TABLET | Freq: Three times a day (TID) | ORAL | Status: DC
  Administered 2011-08-11 – 2011-08-15 (×13): 400 mg via ORAL
  Filled 2011-08-10 (×16): qty 2

## 2011-08-10 MED ORDER — OXYCODONE HCL 5 MG PO TABS
ORAL_TABLET | ORAL | Status: AC
  Administered 2011-08-10: 10 mg
  Filled 2011-08-10: qty 2

## 2011-08-10 MED ORDER — ONDANSETRON HCL 4 MG/2ML IJ SOLN
4.0000 mg | Freq: Four times a day (QID) | INTRAMUSCULAR | Status: DC | PRN
  Administered 2011-08-12: 4 mg via INTRAVENOUS
  Filled 2011-08-10: qty 2

## 2011-08-10 MED ORDER — SENNOSIDES-DOCUSATE SODIUM 8.6-50 MG PO TABS
1.0000 | ORAL_TABLET | Freq: Every evening | ORAL | Status: DC | PRN
  Filled 2011-08-10: qty 1

## 2011-08-10 MED ORDER — ONDANSETRON HCL 4 MG/2ML IJ SOLN: 4.0000 mg | Freq: Four times a day (QID) | INTRAMUSCULAR | Status: DC | PRN

## 2011-08-10 MED ORDER — RENA-VITE PO TABS
1.0000 | ORAL_TABLET | Freq: Every day | ORAL | Status: DC
  Administered 2011-08-11 – 2011-08-18 (×8): 1 via ORAL
  Filled 2011-08-10 (×9): qty 1

## 2011-08-10 MED ORDER — OXYCODONE HCL 5 MG PO TABS: 5.0000 mg | ORAL_TABLET | ORAL | Status: AC | PRN

## 2011-08-10 MED ORDER — VECURONIUM BROMIDE 10 MG IV SOLR
INTRAVENOUS | Status: DC | PRN
  Administered 2011-08-10: 5 mg via INTRAVENOUS
  Administered 2011-08-10 (×2): 2 mg via INTRAVENOUS

## 2011-08-10 MED ORDER — NALOXONE HCL 0.4 MG/ML IJ SOLN
0.4000 mg | INTRAMUSCULAR | Status: DC | PRN
  Filled 2011-08-10: qty 1

## 2011-08-10 SURGICAL SUPPLY — 65 items
BAG DECANTER FOR FLEXI CONT (MISCELLANEOUS) IMPLANT
BLADE SURG 11 STRL SS (BLADE) IMPLANT
BLADE SURG 15 STRL LF DISP TIS (BLADE) ×2 IMPLANT
BLADE SURG 15 STRL SS (BLADE) ×4
CANISTER SUCTION 2500CC (MISCELLANEOUS) ×3 IMPLANT
CATH KIT ON Q 5IN SLV (PAIN MANAGEMENT) ×3 IMPLANT
CATH ROBINSON RED A/P 22FR (CATHETERS) IMPLANT
CATH THORACIC 28FR (CATHETERS) IMPLANT
CATH THORACIC 36FR (CATHETERS) ×6 IMPLANT
CATH THORACIC 36FR RT ANG (CATHETERS) IMPLANT
CHERRY SPONGEY 1/2 (GAUZE/BANDAGES/DRESSINGS) ×6 IMPLANT
CLOTH BEACON ORANGE TIMEOUT ST (SAFETY) ×3 IMPLANT
CONN Y 3/8X3/8X3/8  BEN (MISCELLANEOUS) ×2
CONN Y 3/8X3/8X3/8 BEN (MISCELLANEOUS) ×1 IMPLANT
CONT SPEC 4OZ CLIKSEAL STRL BL (MISCELLANEOUS) ×6 IMPLANT
COVER SURGICAL LIGHT HANDLE (MISCELLANEOUS) ×6 IMPLANT
DRAPE LAPAROSCOPIC ABDOMINAL (DRAPES) ×3 IMPLANT
DRAPE SLUSH MACHINE 52X66 (DRAPES) ×3 IMPLANT
ELECT BLADE 4.0 EZ CLEAN MEGAD (MISCELLANEOUS) ×3
ELECT REM PT RETURN 9FT ADLT (ELECTROSURGICAL) ×3
ELECTRODE BLDE 4.0 EZ CLN MEGD (MISCELLANEOUS) ×1 IMPLANT
ELECTRODE REM PT RTRN 9FT ADLT (ELECTROSURGICAL) ×1 IMPLANT
GLOVE BIO SURGEON STRL SZ7.5 (GLOVE) ×9 IMPLANT
GLOVE BIOGEL PI IND STRL 6.5 (GLOVE) ×2 IMPLANT
GLOVE BIOGEL PI INDICATOR 6.5 (GLOVE) ×4
GLOVE ORTHO TXT STRL SZ7.5 (GLOVE) ×3 IMPLANT
GOWN STRL NON-REIN LRG LVL3 (GOWN DISPOSABLE) ×9 IMPLANT
KIT BASIN OR (CUSTOM PROCEDURE TRAY) ×3 IMPLANT
KIT ROOM TURNOVER OR (KITS) ×3 IMPLANT
KIT SUCTION CATH 14FR (SUCTIONS) ×3 IMPLANT
NS IRRIG 1000ML POUR BTL (IV SOLUTION) ×6 IMPLANT
PACK CHEST (CUSTOM PROCEDURE TRAY) ×3 IMPLANT
PAD ARMBOARD 7.5X6 YLW CONV (MISCELLANEOUS) ×6 IMPLANT
SEALANT PROGEL (MISCELLANEOUS) ×3 IMPLANT
SEALANT SURG COSEAL 4ML (VASCULAR PRODUCTS) IMPLANT
SOLUTION ANTI FOG 6CC (MISCELLANEOUS) ×3 IMPLANT
SPONGE GAUZE 4X4 12PLY (GAUZE/BANDAGES/DRESSINGS) ×3 IMPLANT
SPONGE TONSIL 1.25 RF SGL STRG (GAUZE/BANDAGES/DRESSINGS) IMPLANT
SUT CHROMIC 3 0 SH 27 (SUTURE) ×9 IMPLANT
SUT ETHILON 3 0 PS 1 (SUTURE) IMPLANT
SUT PROLENE 3 0 SH DA (SUTURE) IMPLANT
SUT PROLENE 4 0 RB 1 (SUTURE)
SUT PROLENE 4-0 RB1 .5 CRCL 36 (SUTURE) IMPLANT
SUT SILK  1 MH (SUTURE) ×4
SUT SILK 1 MH (SUTURE) ×2 IMPLANT
SUT SILK 2 0 SH CR/8 (SUTURE) ×3 IMPLANT
SUT SILK 2 0SH CR/8 30 (SUTURE) IMPLANT
SUT SILK 3 0SH CR/8 30 (SUTURE) IMPLANT
SUT VIC AB 1 CTX 18 (SUTURE) ×6 IMPLANT
SUT VIC AB 2 TP1 27 (SUTURE) IMPLANT
SUT VIC AB 2-0 CTX 36 (SUTURE) ×3 IMPLANT
SUT VIC AB 3-0 X1 27 (SUTURE) ×6 IMPLANT
SUT VICRYL 0 UR6 27IN ABS (SUTURE) IMPLANT
SUT VICRYL 2 TP 1 (SUTURE) ×3 IMPLANT
SWAB COLLECTION DEVICE MRSA (MISCELLANEOUS) IMPLANT
SYSTEM SAHARA CHEST DRAIN ATS (WOUND CARE) ×3 IMPLANT
TAPE CLOTH SURG 4X10 WHT LF (GAUZE/BANDAGES/DRESSINGS) ×3 IMPLANT
TIP APPLICATOR SPRAY EXTEND 16 (VASCULAR PRODUCTS) IMPLANT
TOWEL OR 17X24 6PK STRL BLUE (TOWEL DISPOSABLE) ×3 IMPLANT
TOWEL OR 17X26 10 PK STRL BLUE (TOWEL DISPOSABLE) ×6 IMPLANT
TRAP SPECIMEN MUCOUS 40CC (MISCELLANEOUS) ×3 IMPLANT
TRAY FOLEY CATH 14FR (SET/KITS/TRAYS/PACK) IMPLANT
TUBE ANAEROBIC SPECIMEN COL (MISCELLANEOUS) IMPLANT
TUNNELER SHEATH ON-Q 11GX8 (MISCELLANEOUS) ×3 IMPLANT
WATER STERILE IRR 1000ML POUR (IV SOLUTION) ×3 IMPLANT

## 2011-08-10 NOTE — Anesthesia Postprocedure Evaluation (Signed)
  Anesthesia Post-op Note  Patient: Nathaniel White  Procedure(s) Performed: Procedure(s) (LRB): VIDEO ASSISTED THORACOSCOPY (VATS)/DECORTICATION (Right)  Patient Location: PACU  Anesthesia Type: General  Level of Consciousness: awake  Airway and Oxygen Therapy: Patient Spontanous Breathing  Post-op Pain: mild  Post-op Assessment: Post-op Vital signs reviewed  Post-op Vital Signs: Reviewed  Complications: No apparent anesthesia complications

## 2011-08-10 NOTE — Anesthesia Preprocedure Evaluation (Addendum)
Anesthesia Evaluation  Patient identified by MRN, date of birth, ID band Patient awake    Reviewed: Allergy & Precautions, H&P , NPO status , Patient's Chart, lab work & pertinent test results, reviewed documented beta blocker date and time   Airway Mallampati: II      Dental  (+) Dental Advidsory Given and Teeth Intact   Pulmonary shortness of breath and with exertion,  breath sounds clear to auscultation        Cardiovascular hypertension, Pt. on medications Rhythm:Regular Rate:Normal     Neuro/Psych CVA, Residual Symptoms    GI/Hepatic negative GI ROS, Neg liver ROS,   Endo/Other  Type 2  Renal/GU negative Renal ROS     Musculoskeletal   Abdominal   Peds  Hematology negative hematology ROS (+)   Anesthesia Other Findings   Reproductive/Obstetrics                         Anesthesia Physical Anesthesia Plan  ASA: III  Anesthesia Plan: General   Post-op Pain Management:    Induction: Intravenous  Airway Management Planned: Oral ETT  Additional Equipment:   Intra-op Plan:   Post-operative Plan: Possible Post-op intubation/ventilation  Informed Consent: I have reviewed the patients History and Physical, chart, labs and discussed the procedure including the risks, benefits and alternatives for the proposed anesthesia with the patient or authorized representative who has indicated his/her understanding and acceptance.   Dental Advisory Given and Dental advisory given  Plan Discussed with: CRNA, Anesthesiologist and Surgeon  Anesthesia Plan Comments:       Anesthesia Quick Evaluation

## 2011-08-10 NOTE — Transfer of Care (Signed)
Immediate Anesthesia Transfer of Care Note  Patient: Nathaniel White  Procedure(s) Performed: Procedure(s) (LRB): VIDEO ASSISTED THORACOSCOPY (VATS)/DECORTICATION (Right)  Patient Location: PACU  Anesthesia Type: General  Level of Consciousness: awake, alert  and oriented  Airway & Oxygen Therapy: Patient Spontanous Breathing and Patient connected to face mask oxygen  Post-op Assessment: Report given to PACU RN, Post -op Vital signs reviewed and stable and Patient moving all extremities X 4  Post vital signs: Reviewed and stable  Complications: No apparent anesthesia complications

## 2011-08-10 NOTE — Anesthesia Procedure Notes (Signed)
Procedures [facility] CVP 1015-1030: The patient was identified and consent obtained.  TO was performed, and full barrier precautions were used.  The skin was anesthetized with lidocaine.  Once the vein was located with the 22 ga. needle using ultrasound guidance , the wire was inserted into the vein.  The wire location was confirmed with ultrasound.  The tissue was dilated and the catheter was carefully inserted, then sutured in place. A dressing was applied. The patient tolerated the procedure well.   CE

## 2011-08-10 NOTE — Brief Op Note (Signed)
                   [address]            [place] [address]          [id number]    08/10/2011  1:56 PM  PATIENT:  Nathaniel White  51 y.o. male  PRE-OPERATIVE DIAGNOSIS:  Recurrent Right Pleural Effusion  POST-OPERATIVE DIAGNOSIS:  Recurrent Right Pleural Effusion/EMPYEMA  PROCEDURE:  Procedure(s): VIDEO ASSISTED THORACOSCOPY (VATS)/DECORTICATION RIGHT LOWER LOBE SURGEON:  Surgeon(s): Ivin Poot, MD  PHYSICIAN ASSISTANT: WAYNE GOLD PA-C  ANESTHESIA:   general  SPECIMEN:  Source of Specimen:  PLEURAL PEEL FOR CULTURES/PATH  DISPOSITION OF SPECIMEN:  Pathology/MICRO  DRAINS: 2 Chest Tube(s) in the RIGHT CHEST   PATIENT CONDITION:  PACU - hemodynamically stable.  PRE-OPERATIVE WEIGHT: 0000000  COMPLICATIONS: NO KNOWN

## 2011-08-10 NOTE — OR Nursing (Signed)
Progel Pleural Air Leak Sealant 64mL Lot # [id number] Expiration date 01/08/2013.

## 2011-08-10 NOTE — Progress Notes (Signed)
The patient was examined and preop studies reviewed. There has been no change from the prior exam and the patient is ready for surgery.  Plan R VATS, decortication this am.patient understands the reasons for this operation and the potential risks.

## 2011-08-10 NOTE — Preoperative (Signed)
Beta Blockers   Reason not to administer Beta Blockers:Not Applicable 

## 2011-08-11 ENCOUNTER — Inpatient Hospital Stay (HOSPITAL_COMMUNITY): Payer: Medicare Other

## 2011-08-11 LAB — CBC
HCT: 33.2 % — ABNORMAL LOW (ref 39.0–52.0)
Hemoglobin: 10.2 g/dL — ABNORMAL LOW (ref 13.0–17.0)
MCH: 27.4 pg (ref 26.0–34.0)
MCHC: 30.7 g/dL (ref 30.0–36.0)

## 2011-08-11 LAB — GLUCOSE, CAPILLARY
Glucose-Capillary: 111 mg/dL — ABNORMAL HIGH (ref 70–99)
Glucose-Capillary: 111 mg/dL — ABNORMAL HIGH (ref 70–99)
Glucose-Capillary: 120 mg/dL — ABNORMAL HIGH (ref 70–99)
Glucose-Capillary: 123 mg/dL — ABNORMAL HIGH (ref 70–99)
Glucose-Capillary: 97 mg/dL (ref 70–99)

## 2011-08-11 LAB — POCT I-STAT 3, ART BLOOD GAS (G3+)
Acid-Base Excess: 2 mmol/L (ref 0.0–2.0)
Bicarbonate: 27.6 mEq/L — ABNORMAL HIGH (ref 20.0–24.0)
O2 Saturation: 94 %
Patient temperature: 98.5
TCO2: 29 mmol/L (ref 0–100)
pCO2 arterial: 45.2 mmHg — ABNORMAL HIGH (ref 35.0–45.0)
pH, Arterial: 7.394 (ref 7.350–7.450)
pO2, Arterial: 71 mmHg — ABNORMAL LOW (ref 80.0–100.0)

## 2011-08-11 LAB — BASIC METABOLIC PANEL
BUN: 39 mg/dL — ABNORMAL HIGH (ref 6–23)
Calcium: 9.1 mg/dL (ref 8.4–10.5)
GFR calc non Af Amer: 6 mL/min — ABNORMAL LOW (ref >90)
Glucose, Bld: 93 mg/dL (ref 70–99)

## 2011-08-11 MED ORDER — SODIUM CHLORIDE 0.9 % IJ SOLN
10.0000 mL | INTRAMUSCULAR | Status: DC | PRN
  Administered 2011-08-18: 10 mL
  Filled 2011-08-11: qty 10

## 2011-08-11 MED ORDER — SODIUM CHLORIDE 0.9 % IJ SOLN
10.0000 mL | Freq: Two times a day (BID) | INTRAMUSCULAR | Status: DC
  Administered 2011-08-11 – 2011-08-14 (×6): 10 mL
  Administered 2011-08-15: 20 mL
  Administered 2011-08-15: 10 mL
  Administered 2011-08-16 (×2): 20 mL
  Administered 2011-08-17 – 2011-08-18 (×2): 10 mL
  Filled 2011-08-11: qty 10
  Filled 2011-08-11 (×4): qty 20

## 2011-08-11 NOTE — Progress Notes (Signed)
1 Day Post-Op Procedure(s) (LRB): VIDEO ASSISTED THORACOSCOPY (VATS)/DECORTICATION (Right) Subjective: Status post right baths decortication right lower lobe were trapped lung Operative specimen no organisms no growth so far Chest x-ray with improved expansion the right lung Minimal chest tube drainage without air leak Patient scheduled for hemodialysis today  Objective: Vital signs in last 24 hours: Temp:  [97.4 F (36.3 C)-98.5 F (36.9 C)] 97.9 F (36.6 C) (08/03 0754) Pulse Rate:  [35-102] 102  (08/03 0900) Cardiac Rhythm:  [-] Normal sinus rhythm (08/03 0800) Resp:  [8-20] 20  (08/03 1000) BP: (125-155)/(79-101) 143/89 mmHg (08/03 1000) SpO2:  [93 %-100 %] 94 % (08/03 0800) Arterial Line BP: (133-187)/(67-92) 187/92 mmHg (08/03 0900) Weight:  [165 lb 2 oz (74.9 kg)-166 lb 0.1 oz (75.3 kg)] 166 lb 0.1 oz (75.3 kg) (08/03 0600)  Hemodynamic parameters for last 24 hours:   sinus rhythm stable blood pressure  Intake/Output from previous day: 08/02 0701 - 08/03 0700 In: 2864 [P.O.:250; I.V.:2214; IV Piggyback:400] Out: 790 [Blood:100; Chest Tube:390] Intake/Output this shift: Total I/O In: 183 [P.O.:120; I.V.:63] Out: -   Lungs with diminished breath sounds on the right neuro intact Extremities warm  Lab Results:  Basename 08/11/11 0406 08/10/11 0823 08/08/11 1202  WBC 4.9 -- 4.4  HGB 10.2* 13.3 --  HCT 33.2* 39.0 --  PLT 128* -- 142*   BMET:  Basename 08/11/11 0406 08/10/11 0823 08/08/11 1202  NA 135 137 --  K 5.2* 3.9 --  CL 95* -- 98  CO2 25 -- 27  GLUCOSE 93 95 --  BUN 39* -- 43*  CREATININE 9.43* -- 9.10*  CALCIUM 9.1 -- 9.8    PT/INR:  Basename 08/08/11 1202  LABPROT 13.6  INR 1.02   ABG    Component Value Date/Time   PHART 7.394 08/11/2011 0401   HCO3 27.6* 08/11/2011 0401   TCO2 29 08/11/2011 0401   O2SAT 94.0 08/11/2011 0401   CBG (last 3)   Basename 08/11/11 0740 08/11/11 0032 08/10/11 2023  GLUCAP 97 111* 96    Assessment/Plan: S/P  Procedure(s) (LRB): VIDEO ASSISTED THORACOSCOPY (VATS)/DECORTICATION (Right) Leave chest tube to suction to pull up the right lower lobe   -   hemodialysis today with low heparin We then SICU    LOS: 1 day    VAN TRIGT III,Nathaniel White 08/11/2011

## 2011-08-11 NOTE — Progress Notes (Signed)
Dr. Lorrene Reid called with am labs, orders received to call Dr. Jonnie Finner at 0800 for dialysis orders.

## 2011-08-11 NOTE — Op Note (Signed)
NAME:  Nathaniel White, Nathaniel White NO.:  [id number]  MEDICAL RECORD NO.:  [id number]  LOCATION:  2311                         FACILITY:  [facility]  PHYSICIAN:  Ivin Poot, M.D.  DATE OF BIRTH:  18-Jun-1960  DATE OF PROCEDURE: DATE OF DISCHARGE:                              OPERATIVE REPORT   OPERATION: 1. Right VATS, minithoracotomy and decortication of right lower lobe. 2. Placement of wound On-Q pain irrigation system.  SURGEON:  Ivin Poot, MD  ASSISTANT:  John Giovanni, PA-C  PREOPERATIVE DIAGNOSIS:  Entrapped right lower lobe with chronic right pleural effusion.  POSTOPERATIVE DIAGNOSIS:  Entrapped right lower lobe with chronic right pleural effusion.  ANESTHESIA:  General.  INDICATIONS:  The patient is a 51 year old African American male on chronic hemodialysis, who has had a recurring right pleural effusion, receiving thoracentesis twice.  In each occasion, the fluid has returned.  CT scan shows a fairly thick membrane over the right lower lobe, which entrapped the lung causing a residual space, which keeps filling in with fluid.  Thoracic Surgical evaluation was requested for possible decortication to release the right lower lobe.  The patient has been short of breath and has had a nonproductive cough.  PROCEDURE:  The patient was brought to the operating room and placed in supine on the operating room table where general anesthesia was induced. The patient was turned right side up exposing the right chest.  The right chest was prepped and draped as a sterile field.  A proper time- out was performed.  A small incision was made at the tip of the scapula and fifth interspace and extended approximately 8 cm.  A small retractor was placed, and the ribs were stopped gently spread.  The pleural space was obliterated with adhesions, loculated fluid and some old blood.  The pleural space was completely freed up and dissected.  All pockets of fluid were opened  up.  A thick leather-like peel was over the visceral pleura of the entire right lower lobe.  This was sharply dissected and then carefully peeled away to allow re-expansion of the right lower lobe.  Areas of bleeding or lung parenchymal abrasions were treated with medical adhesive (ProGel) and some interrupted figure-of-eight chromic sutures.  After removing the leather-like peel over the right lower lobe, the lungs were re-expanded and there was excellent re-expansion of the right lower lobe.  Two pleural chest tubes were placed anteriorly and posteriorly in the right chest and brought out through separate incisions.  The incision was then closed with #2 Vicryl for the pericostals.  The muscle was closed in layer with interrupted #1 Vicryl. The subcutaneous and skin layers were closed in running Vicryl.  An On-Q catheter was placed between the main incision and the chest tube incision, secured the skin with silk suture and connected to a Marcaine 0.5% reservoir.  The patient was extubated and returned to the recovery room in stable condition.     Ivin Poot, M.D.     PV/MEDQ  D:  08/10/2011  T:  08/11/2011  Job:  [id number]  cc:   Donato Heinz, M.D.

## 2011-08-12 ENCOUNTER — Inpatient Hospital Stay (HOSPITAL_COMMUNITY): Payer: Medicare Other

## 2011-08-12 LAB — COMPREHENSIVE METABOLIC PANEL
ALT: 8 U/L (ref 0–53)
AST: 16 U/L (ref 0–37)
Albumin: 2.5 g/dL — ABNORMAL LOW (ref 3.5–5.2)
Alkaline Phosphatase: 59 U/L (ref 39–117)
BUN: 60 mg/dL — ABNORMAL HIGH (ref 6–23)
CO2: 24 mEq/L (ref 19–32)
Calcium: 8.9 mg/dL (ref 8.4–10.5)
Chloride: 90 mEq/L — ABNORMAL LOW (ref 96–112)
Creatinine, Ser: 11.74 mg/dL — ABNORMAL HIGH (ref 0.50–1.35)
GFR calc Af Amer: 5 mL/min — ABNORMAL LOW (ref >90)
GFR calc non Af Amer: 4 mL/min — ABNORMAL LOW (ref >90)
Glucose, Bld: 107 mg/dL — ABNORMAL HIGH (ref 70–99)
Potassium: 5.4 mEq/L — ABNORMAL HIGH (ref 3.5–5.1)
Sodium: 130 mEq/L — ABNORMAL LOW (ref 135–145)
Total Bilirubin: 0.3 mg/dL (ref 0.3–1.2)
Total Protein: 6.8 g/dL (ref 6.0–8.3)

## 2011-08-12 LAB — GLUCOSE, CAPILLARY
Glucose-Capillary: 112 mg/dL — ABNORMAL HIGH (ref 70–99)
Glucose-Capillary: 124 mg/dL — ABNORMAL HIGH (ref 70–99)
Glucose-Capillary: 94 mg/dL (ref 70–99)

## 2011-08-12 LAB — CBC
Hemoglobin: 10.6 g/dL — ABNORMAL LOW (ref 13.0–17.0)
MCHC: 30.9 g/dL (ref 30.0–36.0)
RBC: 3.86 MIL/uL — ABNORMAL LOW (ref 4.22–5.81)

## 2011-08-12 MED ORDER — DEXTROSE 5 % IV SOLN
2.0000 g | INTRAVENOUS | Status: DC
  Filled 2011-08-12: qty 2

## 2011-08-12 MED ORDER — FENTANYL CITRATE 0.05 MG/ML IJ SOLN
50.0000 ug | INTRAMUSCULAR | Status: DC | PRN
  Administered 2011-08-13 – 2011-08-17 (×3): 50 ug via INTRAVENOUS
  Filled 2011-08-12 (×4): qty 2

## 2011-08-12 MED ORDER — GUAIFENESIN ER 600 MG PO TB12
600.0000 mg | ORAL_TABLET | Freq: Two times a day (BID) | ORAL | Status: DC
  Administered 2011-08-12 – 2011-08-19 (×14): 600 mg via ORAL
  Filled 2011-08-12 (×16): qty 1

## 2011-08-12 MED ORDER — DEXTROSE 5 % IV SOLN
1.0000 g | Freq: Two times a day (BID) | INTRAVENOUS | Status: DC
  Filled 2011-08-12: qty 1

## 2011-08-12 MED ORDER — ONDANSETRON HCL 4 MG/2ML IJ SOLN: 4.0000 mg | Freq: Four times a day (QID) | INTRAMUSCULAR | Status: DC | PRN

## 2011-08-12 MED ORDER — DEXTROSE 5 % IV SOLN
2.0000 g | INTRAVENOUS | Status: DC
  Administered 2011-08-12 – 2011-08-18 (×4): 2 g via INTRAVENOUS
  Filled 2011-08-12 (×4): qty 2

## 2011-08-12 MED ORDER — ONDANSETRON HCL 4 MG/2ML IJ SOLN
INTRAMUSCULAR | Status: AC
  Administered 2011-08-12: 4 mg via INTRAVENOUS
  Filled 2011-08-12: qty 2

## 2011-08-12 NOTE — Progress Notes (Addendum)
2 Days Post-Op Procedure(s) (LRB): VIDEO ASSISTED THORACOSCOPY (VATS)/DECORTICATION (Right) Subjective: A loculated right pleural effusion with entrapment a right lower lobe status post decortication postop day 2 No airleak but total chest tube drainage still 200 cc per day Patient with some confusion on PCA we'll discontinue PCA Dialysis plan this evening  Objective: Vital signs in last 24 hours: Temp:  [97.4 F (36.3 C)-98.4 F (36.9 C)] 98.4 F (36.9 C) (08/04 1600) Pulse Rate:  [87-107] 107  (08/04 1645) Cardiac Rhythm:  [-] Sinus tachycardia (08/04 1645) Resp:  [14-30] 18  (08/04 1645) BP: (116-156)/(71-98) 156/98 mmHg (08/04 1645) SpO2:  [88 %-97 %] 94 % (08/04 1645) Weight:  [169 lb 12.1 oz (77 kg)] 169 lb 12.1 oz (77 kg) (08/04 0400)  Hemodynamic parameters for last 24 hours:   sinus rhythm good O2 sat  Intake/Output from previous day: 08/03 0701 - 08/04 0700 In: 1236 [P.O.:720; I.V.:296; IV Piggyback:100] Out: 120 [Chest Tube:120] Intake/Output this shift: Total I/O In: 286 [P.O.:120; I.V.:166] Out: 70 [Chest Tube:70]    Lab Results:  Basename 08/12/11 0355 08/11/11 0406  WBC 7.6 4.9  HGB 10.6* 10.2*  HCT 34.3* 33.2*  PLT 160 128*   BMET:  Basename 08/12/11 0355 08/11/11 0406  NA 130* 135  K 5.4* 5.2*  CL 90* 95*  CO2 24 25  GLUCOSE 107* 93  BUN 60* 39*  CREATININE 11.74* 9.43*  CALCIUM 8.9 9.1    PT/INR: No results found for this basename: LABPROT,INR in the last 72 hours ABG    Component Value Date/Time   PHART 7.394 08/11/2011 0401   HCO3 27.6* 08/11/2011 0401   TCO2 29 08/11/2011 0401   O2SAT 94.0 08/11/2011 0401   CBG (last 3)   Basename 08/12/11 0723 08/12/11 0339 08/11/11 2357  GLUCAP 94 112* 124*    Assessment/Plan: S/P Procedure(s) (LRB): VIDEO ASSISTED THORACOSCOPY (VATS)/DECORTICATION (Right) See progression orders Due to confusion  Will cont IV antibiotics  Until OR culture are final  LOS: 2 days    VAN TRIGT  III,Loan Oguin 08/12/2011

## 2011-08-12 NOTE — Progress Notes (Signed)
Spoke with Dr. Lucianne Lei Trigt--pt just vomited profusely and has had increase in disorientation. Plan of care is to dialyze this evening and give Zofran now--Dr. Prescott Gum states he will order a CT of the head. Armandina Gemma, Kirby Crigler

## 2011-08-12 NOTE — Consult Note (Signed)
[facility] Consultation Note  Indication for Consultation:  Management of ESRD/hemodialysis; anemia, hypertension/volume and secondary hyperparathyroidism  HPI: Nathaniel White is a 51 y.o. male admitted by Dr. Prescott White for evaluation of a recurring right pleural effusion, receiving thoracentesis twice.Thoracic Surgical evaluation was requested for  possible decortication to release the right lower lobe. The patient has had reportedly been short of breath and has had a nonproductive cough. Normal hemodialysis is tue, Thrus, Sat. At [facility].  Today [facility] is seen for Nephrology consult but is not able to give any history because of AMS, pleasantly confused he is disoriented to place, time, knows me form [facility] by not by name."Something done to my right side don't know name of Doctor."           Past Medical History  Diagnosis Date  . Hypertension   . Renal insufficiency   . Anemia   . Stroke   . Hemiparesis due to old stroke     left  . LV dysfunction     EF 25-30% by echo 07/2011  . MR (mitral regurgitation)     moderate to severe, echo 07/2011    Past Surgical History  Procedure Date  . Kidney transplant     2011 rejected kidney 2012 back on dialysis  . Av fistula placement   . Cardiac catheterization     [place] [facility]     History reviewed. No pertinent family history.   Social history = not able to retain from patient. Allergies  Allergen Reactions  . Codeine Shortness Of Breath    Prior to Admission medications   Medication Sig Start Date End Date Taking? Authorizing Provider  amLODipine (NORVASC) 10 MG tablet Take 1 tablet (10 mg total) by mouth at bedtime. 03/14/11 03/13/12 Yes Nathaniel Altes, MD  aspirin EC 81 MG tablet Take 81 mg by mouth daily.   Yes Historical Provider, MD  calcium carbonate (TUMS - DOSED IN MG ELEMENTAL CALCIUM) 500 MG chewable tablet Chew 2 tablets (400 mg of elemental calcium total) by  mouth 3 (three) times daily with meals. 03/14/11 03/13/12 Yes Nathaniel Altes, MD  cinacalcet (SENSIPAR) 30 MG tablet Take 30 mg by mouth daily.   Yes Historical Provider, MD  cloNIDine (CATAPRES - DOSED IN MG/24 HR) 0.3 mg/24hr Place 1 patch onto the skin once a week. Change on Tuesday   Yes Historical Provider, MD  labetalol (NORMODYNE) 300 MG tablet Take 150 mg by mouth 2 (two) times daily.   Yes Historical Provider, MD  multivitamin (RENA-VIT) TABS tablet Take 1 tablet by mouth at bedtime. 03/14/11  Yes Nathaniel Altes, MD    [id number], diphenhydrAMINE, naloxone, ondansetron (ZOFRAN) IV, oxyCODONE, potassium chloride, senna-docusate, sodium chloride, sodium chloride, traMADol    ROS: reported sob and cough, Unable to get history secondary to confusion of  Pt.   Physical Exam: Filed Vitals:   08/12/11 1157  BP:   Pulse:   Temp:   Resp: 16     General: Alert but pleasantly confused, young BM, NAD HEENT: [place], MMM Eyes: eomi  Neck: supple, no jvd Heart: RRR,  2/6 sem lsb , no rub or gallop Lungs: decreased at right with chest tube, left rales Abdomen: bs==, soft, nontender Extremities: no pedal edema Skin: warm dry , no rash Neuro: has "old" LUE weakness from prior CVA per pt. Strength 4/5 LUE, 5/5, legs without focal weakness, ?mild L facial droop I think is probably old  as well. No dysarthria. Knows he is in [facility], not sure where he lives, what he family member's names are.  Just remembers one of his best friends names. Is alert, not oversedated.  Dialysis Access: left upper arm avf  Dialysis Orders: White: [facility]  TTS .Will call [facility] in am to send info=closed sundays EDW / HD [place] /  Time / Heparin /. Access / BFR / DFR /    Zemplar / mcg IV/HD Epogen /   Units IV/HD  Venofer  /  Other /  Assessment/Plan 1. Entrapped right lower lobe with chronic right pleural effusion.= sp VATS, minithoracotomy and decortication of right lower lobe on 8/2= per  Dr. Prescott White 2. AMS= ? Etiology= Metabolic with missed hd/ med  Effect with Anesthesia / pca using Fentanyl 77mcg q 6 times per day and or Tramadol vs. CVA recurrence=  Change pain med/ will dw Dr. Jonnie White. Patient having acute memory loss today- don't see any Nathaniel focal neuro deficits on exam (old LUE hemiparesis from prior CVA per pt).  Fentanyl PCA has been turned off, last dose was at 12:22 pm today. Got Ultram at 12 noon 100 mg. Discussed with attending, Dr. Prescott White. ?CVA.  Head CT to be ordered. 3. ESRD -Hd today  3hrs.  With no heparin, call [facility] in am for info,  Normal TTS  [employer] 4. Hypertension/volume  - stable on meds and attempt 2 to 3 liters uf on hd 5. Anemia  - check kid. White for iron and epo dosing  Hgb=10.6 today stable  10.2< 11.1 preop 6. Metabolic bone disease -  Ca 8.9/ on binders= Tums and Sensipar 30mg 7.Nutrition - use high protein renal  restricitons. 7. HX of CVA Nathaniel Haber, PA-C [place] [phone] 08/12/2011, 12:31 PM   Patient seen and examined and agree with assessment and plan as above with additions as indicated.  Nathaniel Splinter  MD [employer] [phone] pgr    [phone] cell 08/12/2011, 3:05 PM

## 2011-08-12 NOTE — Progress Notes (Signed)
Pt was informed that he will have a heat CT post hemodialysis treatment.We will call MRI as soon as his HD treatment is complete.

## 2011-08-12 NOTE — Progress Notes (Signed)
Received pt in bed awake but lethargic. Pt oriented to person only. Pt thinks he is in church and he did not know the year or why he is here. Pt denies pain or discomfort. Pt was informed that he will be getting HD treatment tonight.We will continue to monitor and  care for patient.

## 2011-08-12 NOTE — Progress Notes (Signed)
Fentanyl PCA 50 ml wasted in sink. Witnessed by Laurena Spies, RN. Armandina Gemma, Kirby Crigler

## 2011-08-12 NOTE — Progress Notes (Signed)
Pt having mild confusion(unsure of date/place--seems to have trouble with word-finding)--David Zeyfang PA-C@ bedside. Confirmed with Lalla Brothers RN on 2300 that pt has been having mild intermittent confusion. Armandina Gemma, Kirby Crigler

## 2011-08-13 ENCOUNTER — Inpatient Hospital Stay (HOSPITAL_COMMUNITY): Payer: Medicare Other

## 2011-08-13 DIAGNOSIS — Z8673 Personal history of transient ischemic attack (TIA), and cerebral infarction without residual deficits: Secondary | ICD-10-CM | POA: Diagnosis not present

## 2011-08-13 DIAGNOSIS — I6340 Cerebral infarction due to embolism of unspecified cerebral artery: Secondary | ICD-10-CM | POA: Diagnosis not present

## 2011-08-13 DIAGNOSIS — I6350 Cerebral infarction due to unspecified occlusion or stenosis of unspecified cerebral artery: Secondary | ICD-10-CM

## 2011-08-13 LAB — CBC
HCT: 32 % — ABNORMAL LOW (ref 39.0–52.0)
Hemoglobin: 10.2 g/dL — ABNORMAL LOW (ref 13.0–17.0)
MCH: 27.9 pg (ref 26.0–34.0)
MCHC: 31.9 g/dL (ref 30.0–36.0)
MCV: 87.4 fL (ref 78.0–100.0)
Platelets: 127 10*3/uL — ABNORMAL LOW (ref 150–400)
RBC: 3.66 MIL/uL — ABNORMAL LOW (ref 4.22–5.81)
RDW: 18 % — ABNORMAL HIGH (ref 11.5–15.5)
WBC: 6.6 10*3/uL (ref 4.0–10.5)

## 2011-08-13 LAB — BASIC METABOLIC PANEL
BUN: 41 mg/dL — ABNORMAL HIGH (ref 6–23)
CO2: 27 mEq/L (ref 19–32)
Calcium: 8.6 mg/dL (ref 8.4–10.5)
Chloride: 95 mEq/L — ABNORMAL LOW (ref 96–112)
Creatinine, Ser: 9.38 mg/dL — ABNORMAL HIGH (ref 0.50–1.35)
GFR calc Af Amer: 7 mL/min — ABNORMAL LOW (ref >90)
GFR calc non Af Amer: 6 mL/min — ABNORMAL LOW (ref >90)
Glucose, Bld: 93 mg/dL (ref 70–99)
Potassium: 4.3 mEq/L (ref 3.5–5.1)
Sodium: 137 mEq/L (ref 135–145)

## 2011-08-13 LAB — TISSUE CULTURE
Culture: NO GROWTH
Gram Stain: NONE SEEN

## 2011-08-13 MED ORDER — HEPARIN SODIUM (PORCINE) 1000 UNIT/ML DIALYSIS
20.0000 [IU]/kg | INTRAMUSCULAR | Status: DC | PRN
  Administered 2011-08-14: 1500 [IU] via INTRAVENOUS_CENTRAL

## 2011-08-13 MED ORDER — CLOPIDOGREL BISULFATE 75 MG PO TABS
75.0000 mg | ORAL_TABLET | Freq: Every day | ORAL | Status: DC
  Administered 2011-08-14 – 2011-08-19 (×5): 75 mg via ORAL
  Filled 2011-08-13 (×7): qty 1

## 2011-08-13 NOTE — Progress Notes (Signed)
Visit to pt per referral on rounds.  Pt with P.T. At time, when reattempted @ 16:20, pt was sleeping.  Will follow-up Tuesday pm for pt support.  Please pg if pt request visit before then- [phone].

## 2011-08-13 NOTE — Progress Notes (Signed)
Dr Lucianne Lei Tright rounded, clips were used during surgery, NO MRI.

## 2011-08-13 NOTE — Progress Notes (Signed)
Pt NPO until SLP swallow eval. After speaking to neuro, it was decided to defer to SLP d/t status post VATS. VSS, will continue to monitor.

## 2011-08-13 NOTE — Consult Note (Signed)
[facility] CONSULT [facility]     Reason for Consult: CT findings    HPI:    Nathaniel White is an 51 y.o. male who was admitted to [facility] hospital for VATS/decorication for recurrent pleural effusion on 08/10/11. Patient has known ESRD with now non-functioning kidney transplant '11 ([facility]) and on dialysis, HTN, anemia, CVA LV dysfunction most recent echo 07/23/11 showing cavity size was normal. Systolic function was severely reduced. The estimated ejection fraction was in the range of 25% to 30%. Diffuse/generalized hypokinesis of severe degree.   08/10/11 patient underwent VATS/decorication for recurrent pleural effusion. On 08/12/11 patient was noted to have confusion and difficulty. CT head was ordered on 08/13/11 which noted Wedge-shaped low attenuation area in the left parietal lobe with sulci effacement likely to represent acute infarct.  Neurology was called for consult.    Past Medical History  Diagnosis Date  . Hypertension   . Renal insufficiency   . Anemia   . Stroke   . Hemiparesis due to old stroke     left  . LV dysfunction     EF 25-30% by echo 07/2011  . MR (mitral regurgitation)     moderate to severe, echo 07/2011    Past Surgical History  Procedure Date  . Kidney transplant     2011 rejected kidney 2012 back on dialysis  . Av fistula placement   . Cardiac catheterization     [place] [facility]    History reviewed. No pertinent family history.  Social History:  reports that he has been smoking Cigarettes.  He does not have any smokeless tobacco history on file. He reports that he does not drink alcohol or use illicit drugs.  Allergies  Allergen Reactions  . Codeine Shortness Of Breath    Medications:    Prior to Admission:  Prescriptions prior to admission  Medication Sig Dispense Refill  . amLODipine (NORVASC) 10 MG tablet Take 1 tablet (10 mg total) by mouth at bedtime.  30 tablet  0  . aspirin EC 81 MG tablet Take 81 mg by mouth  daily.      . calcium carbonate (TUMS - DOSED IN MG ELEMENTAL CALCIUM) 500 MG chewable tablet Chew 2 tablets (400 mg of elemental calcium total) by mouth 3 (three) times daily with meals.      . cinacalcet (SENSIPAR) 30 MG tablet Take 30 mg by mouth daily.      . cloNIDine (CATAPRES - DOSED IN MG/24 HR) 0.3 mg/24hr Place 1 patch onto the skin once a week. Change on Tuesday      . labetalol (NORMODYNE) 300 MG tablet Take 150 mg by mouth 2 (two) times daily.      . multivitamin (RENA-VIT) TABS tablet Take 1 tablet by mouth at bedtime.  30 tablet  0   Scheduled:   . amLODipine  10 mg Oral QHS  . aspirin EC  81 mg Oral Daily  . bisacodyl  10 mg Oral Daily  . calcium carbonate  2 tablet Oral TID WC  . cefTAZidime (FORTAZ)  IV  2 g Intravenous Q T,Th,Sat-1800  . cinacalcet  30 mg Oral Daily  . cloNIDine  0.3 mg Transdermal Q Tue  . guaiFENesin  600 mg Oral BID  . labetalol  150 mg Oral BID  . multivitamin  1 tablet Oral QHS  . sodium chloride  10-40 mL  Intracatheter Q12H  . DISCONTD: cefTAZidime (FORTAZ)  IV  1 g Intravenous Q12H  . DISCONTD: cefTAZidime (FORTAZ)  IV  2 g Intravenous Q T,Th,Sa-HD  . DISCONTD: fentaNYL   Intravenous Q4H    Review of Systems -patient is aphasic and unable to give a good ROS.    Blood pressure 134/80, pulse 94, temperature 98.4 F (36.9 C), temperature source Oral, resp. rate 29, height 5\' 9"  (1.753 m), weight 75.9 kg (167 lb 5.3 oz), SpO2 95.00%.   Neurologic Examination:   Mental Status: Alert, oriented to  Day and follows commands to best his ability. Speech fluent but shows both expressive and receptive aphasia. He cannot name objects correctly, he is able to repeat single words but unable to correctly repeat sentences. He has difficulty following vocal commands and visual commands. At times he articulate wheat he would like to say such as stating "can you fix me" yet other times cannot even start a sentence.  Tearful. Cranial Nerves: II-Visual fields  grossly intact. III/IV/VI-Extraocular movements intact.  Pupils reactive bilaterally. Ptosis not present. V/VII-Smile asymmetric with less teeth noted on smile on the left.  VIII-grossly intact IX/X-normal gag XI-bilateral shoulder shrug XII-midline tongue extension Motor: patient has old right CVA which left him with left sided weakness on left arm and leg. HE is able to lift his left arm antigravity to 90 degrees and shows 4/5 strength with shoulder abduction. Left bicep 5/5, tricep is 4-/5, left grip is 5/5.  Right arm shows 5/5 other than tricep extension which is 4/5.  Right leg 5/5. Left hip flexion is 4-/5, knee flexion/extension 5/5, DF 4-/5, PF 4/5.  Sensory: Pinprick and light touch intact but decreased on the left arm and leg Deep Tendon Reflexes: 2+ brisk symmetric throughout.     Plantars:      Right:  downgoing     Left:  upgoing Cerebellar: Normal finger-to-nose smooth on the right and dysmetric on left (likely due to strength),  normal heel-to-shin on right but unable due to weakness on left.     No results found for this basename: cbc, bmp, coags, chol, tri, ldl, hga1c    Results for orders placed during the hospital encounter of 08/10/11 (from the past 48 hour(s))  GLUCOSE, CAPILLARY     Status: Abnormal   Collection Time   08/11/11  3:50 PM      Component Value Range Comment   Glucose-Capillary 111 (*) 70 - 99 mg/dL   GLUCOSE, CAPILLARY     Status: Abnormal   Collection Time   08/11/11  8:15 PM      Component Value Range Comment   Glucose-Capillary 123 (*) 70 - 99 mg/dL    Comment 1 Notify RN      Comment 2 Documented in Chart     GLUCOSE, CAPILLARY     Status: Abnormal   Collection Time   08/11/11 11:57 PM      Component Value Range Comment   Glucose-Capillary 124 (*) 70 - 99 mg/dL    Comment 1 Documented in Chart      Comment 2 Notify RN     GLUCOSE, CAPILLARY     Status: Abnormal   Collection Time   08/12/11  3:39 AM      Component Value Range Comment    Glucose-Capillary 112 (*) 70 - 99 mg/dL    Comment 1 Documented in Chart      Comment 2 Notify RN     CBC  Status: Abnormal   Collection Time   08/12/11  3:55 AM      Component Value Range Comment   WBC 7.6  4.0 - 10.5 K/uL    RBC 3.86 (*) 4.22 - 5.81 MIL/uL    Hemoglobin 10.6 (*) 13.0 - 17.0 g/dL    HCT 34.3 (*) 39.0 - 52.0 %    MCV 88.9  78.0 - 100.0 fL    MCH 27.5  26.0 - 34.0 pg    MCHC 30.9  30.0 - 36.0 g/dL    RDW 18.1 (*) 11.5 - 15.5 %    Platelets 160  150 - 400 K/uL   COMPREHENSIVE METABOLIC PANEL     Status: Abnormal   Collection Time   08/12/11  3:55 AM      Component Value Range Comment   Sodium 130 (*) 135 - 145 mEq/L    Potassium 5.4 (*) 3.5 - 5.1 mEq/L    Chloride 90 (*) 96 - 112 mEq/L    CO2 24  19 - 32 mEq/L    Glucose, Bld 107 (*) 70 - 99 mg/dL    BUN 60 (*) 6 - 23 mg/dL DELTA CHECK NOTED   Creatinine, Ser 11.74 (*) 0.50 - 1.35 mg/dL    Calcium 8.9  8.4 - 10.5 mg/dL    Total Protein 6.8  6.0 - 8.3 g/dL    Albumin 2.5 (*) 3.5 - 5.2 g/dL    AST 16  0 - 37 U/L    ALT 8  0 - 53 U/L    Alkaline Phosphatase 59  39 - 117 U/L    Total Bilirubin 0.3  0.3 - 1.2 mg/dL    GFR calc non Af Amer 4 (*) >90 mL/min    GFR calc Af Amer 5 (*) >90 mL/min   GLUCOSE, CAPILLARY     Status: Normal   Collection Time   08/12/11  7:23 AM      Component Value Range Comment   Glucose-Capillary 94  70 - 99 mg/dL   BASIC METABOLIC PANEL     Status: Abnormal   Collection Time   08/13/11  4:00 AM      Component Value Range Comment   Sodium 137  135 - 145 mEq/L    Potassium 4.3  3.5 - 5.1 mEq/L    Chloride 95 (*) 96 - 112 mEq/L    CO2 27  19 - 32 mEq/L    Glucose, Bld 93  70 - 99 mg/dL    BUN 41 (*) 6 - 23 mg/dL    Creatinine, Ser 9.38 (*) 0.50 - 1.35 mg/dL    Calcium 8.6  8.4 - 10.5 mg/dL    GFR calc non Af Amer 6 (*) >90 mL/min    GFR calc Af Amer 7 (*) >90 mL/min   CBC     Status: Abnormal   Collection Time   08/13/11  4:00 AM      Component Value Range Comment   WBC 6.6  4.0 -  10.5 K/uL    RBC 3.66 (*) 4.22 - 5.81 MIL/uL    Hemoglobin 10.2 (*) 13.0 - 17.0 g/dL    HCT 32.0 (*) 39.0 - 52.0 %    MCV 87.4  78.0 - 100.0 fL    MCH 27.9  26.0 - 34.0 pg    MCHC 31.9  30.0 - 36.0 g/dL    RDW 18.0 (*) 11.5 - 15.5 %    Platelets 127 (*) 150 - 400 K/uL  Ct Head Wo Contrast  08/13/2011  *RADIOLOGY REPORT*  Clinical Data: Altered mental status.  CT HEAD WITHOUT CONTRAST  Technique:  Contiguous axial images were obtained from the base of the skull through the vertex without contrast.  Comparison: None.  Findings: The diffuse cerebral atrophy.  Low attenuation changes in the deep white matter consistent with small vessel ischemia and old lacunar infarct.  Wedge-shaped poorly defined low attenuation focus in the left parietal lobe with sulci effacement consistent with acute infarct.  No midline shift.  No evidence of acute intracranial hemorrhage. No abnormal extra-axial fluid collections. Basal cisterns are not effaced.  Visualized mastoid air cells and paranasal sinuses are not opacified.  No depressed skull fractures. Vascular calcifications.  IMPRESSION: Wedge-shaped low attenuation area in the left parietal lobe with sulci effacement likely to represent acute infarct.  No evidence of acute intracranial hemorrhage.  Original Report Authenticated By: Neale Burly, M.D.   Dg Chest Port 1 View  08/13/2011  *RADIOLOGY REPORT*  Clinical Data: Post vats  PORTABLE CHEST - 1 VIEW  Comparison: 08/12/2011; 08/11/2011; 08/10/2011; chest CT - 07/23/2011  Findings:  Grossly unchanged enlarged cardiac silhouette and mediastinal contours.  Stable position of support apparatus including two right- sided chest tubes. Grossly unchanged small residual right-sided pleural effusion with fluid layering along the right lung apex.  No definite pneumothorax.  Minimal improved aeration of the right mid and lower lung with persistent bilateral heterogeneous airspace opacities.  No new focal airspace  opacities.  Unchanged bones.  IMPRESSION: 1.  Stable positioning of support apparatus.  No pneumothorax. 2.  Minimally improved aeration of the right mid and lower lung with persistent bilateral heterogeneous air space opacities, atelectasis versus infiltrate.  Original Report Authenticated By: Rachel Moulds, M.D.   Dg Chest Port 1 View  08/12/2011  *RADIOLOGY REPORT*  Clinical Data: Right to facets  PORTABLE CHEST - 1 VIEW  Comparison:   the previous day's study  Findings: Two right chest tubes and the left IJ central line are stable in position.  No pneumothorax evident.  Low lung volumes. Some worsening consolidation / atelectasis in both lung bases. Some increase in interstitial infiltrates or edema diffusely. Stable cardiomegaly. Stable right lateral pleural effusion or pleural thickening.  IMPRESSION:  1.  Low volumes with worsening bilateral interstitial and airspace opacities. 2. Support hardware stable in position.  Original Report Authenticated By: Trecia Rogers, M.D.     Assessment/Plan:   51 YO male with acute left parietal lobe infarct S/P VATS/decorication surgery on 08/10/11.  Time of event is unknown as patient was noted to be confused after surgery and CVA was noted on CT scan 08/12/11.  Based on imaging and clinical setting, likely a cardio embolic.  Patient with multiple risk factors including ESRD, hypertension, stroke in the past and cardiomyopathy.  Recommend: 1) MRI/MRA head to further evaluate stroke 2) Change ASA to Plavix 75 mg daily--It is unsure if patients ASA was held prior to surgery.  If not, this would make patient a ASA failure.  3) Carotid doppler 4) Patient's most recent echo 07/23/11 showed EF 25-30% with diffuse/generalized  Hypokinesis of sever degree. Would not repeat at this time.   4) Stroke swallow screen 5) PT/OT/Speech therapy 6) FLP, A1c 7) Neuro checks   Ananda Giusto PA-C [facility] [phone]  08/13/2011, 12:25 PM    Patient  seen and examined.  Clinical course and management discussed.  Necessary edits performed.  I agree with the  above.  Alexis Goodell, MD [facility] [phone]  08/13/2011  5:58 PM

## 2011-08-13 NOTE — Progress Notes (Addendum)
[address]            [place] [address]          [phone]     3 Days Post-Op  Procedure(s) (LRB): VIDEO ASSISTED THORACOSCOPY (VATS)/DECORTICATION (Right) Subjective: Feels short of breath, but doesn't appear to be in any distress. Minimal pain  Objective  Telemetry sinus rhythm  Temp:  [97.6 F (36.4 C)-98.4 F (36.9 C)] 98.4 F (36.9 C) (08/05 0406) Pulse Rate:  [87-107] 95  (08/05 0406) Resp:  [14-32] 22  (08/05 0406) BP: (119-161)/(71-100) 130/84 mmHg (08/05 0406) SpO2:  [88 %-99 %] 98 % (08/05 0406) Weight:  [167 lb 5.3 oz (75.9 kg)-174 lb 2.6 oz (79 kg)] 167 lb 5.3 oz (75.9 kg) (08/04 2220)   Intake/Output Summary (Last 24 hours) at 08/13/11 0752 Last data filed at 08/13/11 0600  Gross per 24 hour  Intake    376 ml  Output   3133 ml  Net  -2757 ml       General appearance: alert, cooperative and no distress Heart: regular rate and rhythm and S1, S2 normal Lungs: coarse right > left base Abdomen: soft, + BS Extremities: warm/ perfused Wound: dressing CDI Neuro- oriented X 1, re-orients fairly easily with prompts, slightly lethargic, MAEx4, strength grossly nl, follows simple commands Lab Results:  Basename 08/13/11 0400 08/12/11 0355  NA 137 130*  K 4.3 5.4*  CL 95* 90*  CO2 27 24  GLUCOSE 93 107*  BUN 41* 60*  CREATININE 9.38* 11.74*  CALCIUM 8.6 8.9  MG -- --  PHOS -- --    Basename 08/12/11 0355  AST 16  ALT 8  ALKPHOS 59  BILITOT 0.3  PROT 6.8  ALBUMIN 2.5*   No results found for this basename: LIPASE:2,AMYLASE:2 in the last 72 hours  Basename 08/13/11 0400 08/12/11 0355  WBC 6.6 7.6  NEUTROABS -- --  HGB 10.2* 10.6*  HCT 32.0* 34.3*  MCV 87.4 88.9  PLT 127* 160   No results found for this basename: CKTOTAL:4,CKMB:4,TROPONINI:4 in the last 72 hours No components found with this basename: POCBNP:3 No results found for this basename: DDIMER in the last 72 hours No results found for this  basename: HGBA1C in the last 72 hours No results found for this basename: CHOL,HDL,LDLCALC,TRIG,CHOLHDL in the last 72 hours No results found for this basename: TSH,T4TOTAL,FREET3,T3FREE,THYROIDAB in the last 72 hours No results found for this basename: VITAMINB12,FOLATE,FERRITIN,TIBC,IRON,RETICCTPCT in the last 72 hours  Medications: Scheduled    . amLODipine  10 mg Oral QHS  . aspirin EC  81 mg Oral Daily  . bisacodyl  10 mg Oral Daily  . calcium carbonate  2 tablet Oral TID WC  . cefTAZidime (FORTAZ)  IV  2 g Intravenous Q T,Th,Sat-1800  . cinacalcet  30 mg Oral Daily  . cloNIDine  0.3 mg Transdermal Q Tue  . guaiFENesin  600 mg Oral BID  . labetalol  150 mg Oral BID  . multivitamin  1 tablet Oral QHS  . sodium chloride  10-40 mL Intracatheter Q12H  . DISCONTD: cefTAZidime (FORTAZ)  IV  1 g Intravenous Q12H  . DISCONTD: cefTAZidime (FORTAZ)  IV  2 g Intravenous Q T,Th,Sa-HD  . DISCONTD: fentaNYL   Intravenous Q4H     Radiology/Studies:  Ct Head Wo Contrast  08/13/2011  *RADIOLOGY REPORT*  Clinical Data: Altered mental status.  CT HEAD  WITHOUT CONTRAST  Technique:  Contiguous axial images were obtained from the base of the skull through the vertex without contrast.  Comparison: None.  Findings: The diffuse cerebral atrophy.  Low attenuation changes in the deep white matter consistent with small vessel ischemia and old lacunar infarct.  Wedge-shaped poorly defined low attenuation focus in the left parietal lobe with sulci effacement consistent with acute infarct.  No midline shift.  No evidence of acute intracranial hemorrhage. No abnormal extra-axial fluid collections. Basal cisterns are not effaced.  Visualized mastoid air cells and paranasal sinuses are not opacified.  No depressed skull fractures. Vascular calcifications.  IMPRESSION: Wedge-shaped low attenuation area in the left parietal lobe with sulci effacement likely to represent acute infarct.  No evidence of acute intracranial  hemorrhage.  Original Report Authenticated By: Neale Burly, M.D.   Dg Chest Port 1 View  08/13/2011  *RADIOLOGY REPORT*  Clinical Data: Post vats  PORTABLE CHEST - 1 VIEW  Comparison: 08/12/2011; 08/11/2011; 08/10/2011; chest CT - 07/23/2011  Findings:  Grossly unchanged enlarged cardiac silhouette and mediastinal contours.  Stable position of support apparatus including two right- sided chest tubes. Grossly unchanged small residual right-sided pleural effusion with fluid layering along the right lung apex.  No definite pneumothorax.  Minimal improved aeration of the right mid and lower lung with persistent bilateral heterogeneous airspace opacities.  No new focal airspace opacities.  Unchanged bones.  IMPRESSION: 1.  Stable positioning of support apparatus.  No pneumothorax. 2.  Minimally improved aeration of the right mid and lower lung with persistent bilateral heterogeneous air space opacities, atelectasis versus infiltrate.  Original Report Authenticated By: Rachel Moulds, M.D.   Dg Chest Port 1 View  08/12/2011  *RADIOLOGY REPORT*  Clinical Data: Right to facets  PORTABLE CHEST - 1 VIEW  Comparison:   the previous day's study  Findings: Two right chest tubes and the left IJ central line are stable in position.  No pneumothorax evident.  Low lung volumes. Some worsening consolidation / atelectasis in both lung bases. Some increase in interstitial infiltrates or edema diffusely. Stable cardiomegaly. Stable right lateral pleural effusion or pleural thickening.  IMPRESSION:  1.  Low volumes with worsening bilateral interstitial and airspace opacities. 2. Support hardware stable in position.  Original Report Authenticated By: Trecia Rogers, M.D.   Dg Chest Port 1 View  08/11/2011  *RADIOLOGY REPORT*  Clinical Data: Chest pain  PORTABLE CHEST - 1 VIEW  Comparison:   the previous day's study  Findings: Two right chest tubes stable position with no pneumothorax.  There is persistent right  lateral pleural thickening or loculated effusion.  Perihilar and bibasilar airspace opacities, right greater than left, largely stable.  Left IJ central line remains direct towards the lateral wall of the proximal SVC. Stable mild cardiomegaly.  Regional bones unremarkable.  IMPRESSION:  1.  Stable appearance since previous day's exam  Original Report Authenticated By: Trecia Rogers, M.D.    Chest tube- 130 cc 24/hr, no air leak noted  INR: Will add last result for INR, ABG once components are confirmed Will add last 4 CBG results once components are confirmed  Assessment/Plan: S/P Procedure(s) (LRB): VIDEO ASSISTED THORACOSCOPY (VATS)/DECORTICATION (Right)    1. Acute CVA on CT scan, with primary issues related to mental status. 2 poss d/c chest tube soon-keep for now 3 conts fortaz  LOS: 3 days    GOLD,WAYNE E 8/5/20137:52 AM    patient examined and medical record reviewed,agree  with above noteLewve chest tubes to suction. VAN TRIGT III,PETER 08/13/2011

## 2011-08-13 NOTE — Progress Notes (Signed)
VASCULAR LAB PRELIMINARY  PRELIMINARY  PRELIMINARY  PRELIMINARY  Carotid duplex  completed.    Preliminary report:  Bilateral:  No evidence of hemodynamically significant internal carotid artery stenosis.   Vertebral artery flow is antegrade.      Gohan Collister, RVT 08/13/2011, 4:20 PM

## 2011-08-13 NOTE — Progress Notes (Signed)
Subjective:  Patient had HD yesterday, tolerated well.  HCT looks like he may have had a new CVA (wedge shape defect in L parietal lobe)  Objective Vital signs in last 24 hours: Filed Vitals:   08/12/11 2315 08/13/11 0325 08/13/11 0400 08/13/11 0406  BP: 144/96 130/84 139/88 130/84  Pulse: 96 95 96 95  Temp: 97.6 F (36.4 C)   98.4 F (36.9 C)  TempSrc: Oral   Oral  Resp: 30 15 16 22   Height:      Weight:      SpO2: 97% 95% 96% 98%   Weight change: 2 kg (4 lb 6.5 oz)  Intake/Output Summary (Last 24 hours) at 08/13/11 0756 Last data filed at 08/13/11 0600  Gross per 24 hour  Intake    376 ml  Output   3133 ml  Net  -2757 ml   Labs: Basic Metabolic Panel:  Lab [id number] 0400 08/12/11 0355 08/11/11 0406  NA 137 130* 135  K 4.3 5.4* 5.2*  CL 95* 90* 95*  CO2 27 24 25   GLUCOSE 93 107* 93  BUN 41* 60* 39*  CREATININE 9.38* 11.74* 9.43*  CALCIUM 8.6 8.9 9.1  ALB -- -- --  PHOS -- -- --   Liver Function Tests:  Lab 08/12/11 0355 08/08/11 1202  AST 16 18  ALT 8 10  ALKPHOS 59 65  BILITOT 0.3 0.3  PROT 6.8 7.3  ALBUMIN 2.5* 2.9*   No results found for this basename: LIPASE:3,AMYLASE:3 in the last 168 hours No results found for this basename: AMMONIA:3 in the last 168 hours CBC:  Lab 08/13/11 0400 08/12/11 0355 08/11/11 0406 08/08/11 1202  WBC 6.6 7.6 4.9 --  NEUTROABS -- -- -- --  HGB 10.2* 10.6* 10.2* --  HCT 32.0* 34.3* 33.2* --  MCV 87.4 88.9 89.2 88.6  PLT 127* 160 128* --   Cardiac Enzymes: No results found for this basename: CKTOTAL:5,CKMB:5,CKMBINDEX:5,TROPONINI:5 in the last 168 hours CBG:  Lab 08/12/11 0723 08/12/11 0339 08/11/11 2357 08/11/11 2015 08/11/11 1550  GLUCAP 94 112* 124* 123* 111*    Iron Studies: No results found for this basename: IRON,TIBC,TRANSFERRIN,FERRITIN in the last 72 hours Studies/Results: Ct Head Wo Contrast  08/13/2011  *RADIOLOGY REPORT*  Clinical Data: Altered mental status.  CT HEAD WITHOUT CONTRAST  Technique:   Contiguous axial images were obtained from the base of the skull through the vertex without contrast.  Comparison: None.  Findings: The diffuse cerebral atrophy.  Low attenuation changes in the deep white matter consistent with small vessel ischemia and old lacunar infarct.  Wedge-shaped poorly defined low attenuation focus in the left parietal lobe with sulci effacement consistent with acute infarct.  No midline shift.  No evidence of acute intracranial hemorrhage. No abnormal extra-axial fluid collections. Basal cisterns are not effaced.  Visualized mastoid air cells and paranasal sinuses are not opacified.  No depressed skull fractures. Vascular calcifications.  IMPRESSION: Wedge-shaped low attenuation area in the left parietal lobe with sulci effacement likely to represent acute infarct.  No evidence of acute intracranial hemorrhage.  Original Report Authenticated By: Neale Burly, M.D.   Dg Chest Port 1 View  08/13/2011  *RADIOLOGY REPORT*  Clinical Data: Post vats  PORTABLE CHEST - 1 VIEW  Comparison: 08/12/2011; 08/11/2011; 08/10/2011; chest CT - 07/23/2011  Findings:  Grossly unchanged enlarged cardiac silhouette and mediastinal contours.  Stable position of support apparatus including two right- sided chest tubes. Grossly unchanged small residual right-sided pleural effusion with fluid layering along  the right lung apex.  No definite pneumothorax.  Minimal improved aeration of the right mid and lower lung with persistent bilateral heterogeneous airspace opacities.  No new focal airspace opacities.  Unchanged bones.  IMPRESSION: 1.  Stable positioning of support apparatus.  No pneumothorax. 2.  Minimally improved aeration of the right mid and lower lung with persistent bilateral heterogeneous air space opacities, atelectasis versus infiltrate.  Original Report Authenticated By: Rachel Moulds, M.D.   Dg Chest Port 1 View  08/12/2011  *RADIOLOGY REPORT*  Clinical Data: Right to facets  PORTABLE  CHEST - 1 VIEW  Comparison:   the previous day's study  Findings: Two right chest tubes and the left IJ central line are stable in position.  No pneumothorax evident.  Low lung volumes. Some worsening consolidation / atelectasis in both lung bases. Some increase in interstitial infiltrates or edema diffusely. Stable cardiomegaly. Stable right lateral pleural effusion or pleural thickening.  IMPRESSION:  1.  Low volumes with worsening bilateral interstitial and airspace opacities. 2. Support hardware stable in position.  Original Report Authenticated By: Trecia Rogers, M.D.   Dg Chest Port 1 View  08/11/2011  *RADIOLOGY REPORT*  Clinical Data: Chest pain  PORTABLE CHEST - 1 VIEW  Comparison:   the previous day's study  Findings: Two right chest tubes stable position with no pneumothorax.  There is persistent right lateral pleural thickening or loculated effusion.  Perihilar and bibasilar airspace opacities, right greater than left, largely stable.  Left IJ central line remains direct towards the lateral wall of the proximal SVC. Stable mild cardiomegaly.  Regional bones unremarkable.  IMPRESSION:  1.  Stable appearance since previous day's exam  Original Report Authenticated By: Trecia Rogers, M.D.   Medications: Infusions:    . bupivacaine ON-Q pain pump    . dextrose 5 % and 0.9% NaCl 20 mL/hr at 08/13/11 0600    Scheduled Medications:    . amLODipine  10 mg Oral QHS  . aspirin EC  81 mg Oral Daily  . bisacodyl  10 mg Oral Daily  . calcium carbonate  2 tablet Oral TID WC  . cefTAZidime (FORTAZ)  IV  2 g Intravenous Q T,Th,Sat-1800  . cinacalcet  30 mg Oral Daily  . cloNIDine  0.3 mg Transdermal Q Tue  . guaiFENesin  600 mg Oral BID  . labetalol  150 mg Oral BID  . multivitamin  1 tablet Oral QHS  . sodium chloride  10-40 mL Intracatheter Q12H  . DISCONTD: cefTAZidime (FORTAZ)  IV  1 g Intravenous Q12H  . DISCONTD: cefTAZidime (FORTAZ)  IV  2 g Intravenous Q T,Th,Sa-HD  .  DISCONTD: fentaNYL   Intravenous Q4H    have reviewed scheduled and prn medications.  Physical Exam: General: alert, says having trouble remembering.  Remembers me from the office but doesn't know my name.  Does not know president or phone number Heart: RRR Lungs: sounds clearer Abdomen: soft, non tender Extremities: no edema Dialysis Access: L AVF, good thrill and bruit   I Assessment/ Plan:  Pt is a 51 y.o. yo male ESRD who was admitted on 08/10/2011 with  VATS/decortication of  R lung Assessment/Plan: 1. S/p VATS/decortication of R lung for chronic pleural effusion - per VVS, CT in place  2. ESRD - normally TTS at [facility].  HD via L AVF.  Had HD on Sunday make up for Saturday.  Next will be due tomorrow to keep on TTS schedule 3. Anemia-  no aranesp for now, will add 4. Secondary hyperparathyroidism- continue TUMs and sensipar.   No vitamin D listed, will get meds from OP kidney center today.   5. HTN/volume- reasonable.  Continue home BP meds (norvasc 10/clonidine/labetalol)  6. Likely new CVA- per primary team.  May want to get neuro involved to get therapies and workup.   Danie Diehl A   08/13/2011,7:56 AM  LOS: 3 days

## 2011-08-13 NOTE — Progress Notes (Signed)
Utilization review completed.  

## 2011-08-13 NOTE — Evaluation (Signed)
Clinical/Bedside Swallow Evaluation Patient Details  Name: Nathaniel White MRN: [id number] Date of Birth: [birth year]  Today's Date: 08/13/2011 Time: 1345-1400 SLP Time Calculation (min): 15 min  Past Medical History:  Past Medical History  Diagnosis Date  . Hypertension   . Renal insufficiency   . Anemia   . Stroke   . Hemiparesis due to old stroke     left  . LV dysfunction     EF 25-30% by echo 07/2011  . MR (mitral regurgitation)     moderate to severe, echo 07/2011   Past Surgical History:  Past Surgical History  Procedure Date  . Kidney transplant     2011 rejected kidney 2012 back on dialysis  . Av fistula placement   . Cardiac catheterization     Nathaniel White medical   HPI:  51 y.o. male who was admitted to [facility] hospital for VATS/decorication for recurrent pleural effusion on 08/10/11. Patient has known ESRD with now non-functioning kidney transplant '11 ([facility]) and on dialysis, HTN, anemia, CVA LV dysfunction most recent echo 07/23/11 showing cavity size was normal. Systolic function was severely reduced. The estimated ejection fraction was in the range of 25% to 30%. Diffuse/generalized hypokinesis of severe degree. On 08/12/11 patient was noted to have confusion and difficulty. CT head was ordered on 08/13/11 which noted Wedge-shaped low attenuation area in the left parietal lobe with sulci effacement likely to represent acute infarct.     Assessment / Plan / Recommendation Clinical Impression  Patient presents with what appears to be a functional oropharyngeal swallow at bedside with no overt s/s of aspiration observed. Recommend a regular diet, thin liquids with general safe swallowing strategies. No SLP f/u for swallowing warranted at this time.     Aspiration Risk  Mild    Diet Recommendation Regular;Thin liquid   Liquid Administration via: Cup;Straw Medication Administration: Whole meds with liquid Supervision: Patient able to self feed;Intermittent supervision to cue for  compensatory strategies Compensations: Slow rate;Small sips/bites Postural Changes and/or Swallow Maneuvers: Seated upright 90 degrees    Other  Recommendations Oral Care Recommendations: Oral care BID   Follow Up Recommendations  None       Pertinent Vitals/Pain n/a    Swallow Study    General HPI: 51 y.o. male who was admitted to [facility] hospital for VATS/decorication for recurrent pleural effusion on 08/10/11. Patient has known ESRD with now non-functioning kidney transplant '11 ([facility]) and on dialysis, HTN, anemia, CVA LV dysfunction most recent echo 07/23/11 showing cavity size was normal. Systolic function was severely reduced. The estimated ejection fraction was in the range of 25% to 30%. Diffuse/generalized hypokinesis of severe degree. On 08/12/11 patient was noted to have confusion and difficulty. CT head was ordered on 08/13/11 which noted Wedge-shaped low attenuation area in the left parietal lobe with sulci effacement likely to represent acute infarct.   Type of Study: Bedside swallow evaluation Diet Prior to this Study: NPO (on a regular diet prior to new onset CVA symptoms) Temperature Spikes Noted: No Respiratory Status: Supplemental O2 delivered via (comment) (2 L nasal cannula) History of Recent Intubation: Yes Length of Intubations (days): 1 days Date extubated: 08/10/11 Behavior/Cognition: Alert;Cooperative;Pleasant mood (frustrated regarding aphasia) Oral Cavity - Dentition: Adequate natural dentition Self-Feeding Abilities: Able to feed self Patient Positioning: Upright in chair Baseline Vocal Quality: Clear Volitional Cough: Strong Volitional Swallow: Able to elicit    Oral/Motor/Sensory Function Overall Oral Motor/Sensory Function: Appears within functional limits for tasks assessed   Ice Chips Ice chips:  Not tested   Thin Liquid Thin Liquid: Within functional limits Presentation: Cup;Self Fed;Straw    Nectar Thick Nectar Thick Liquid: Not tested   Honey Thick Honey  Thick Liquid: Not tested   Puree Puree: Within functional limits Presentation: Self Fed;Spoon   Solid Solid: Within functional limits Presentation: [place], CCC-SLP [phone]  [name] 08/13/2011,2:39 PM

## 2011-08-13 NOTE — Evaluation (Signed)
Speech Language Pathology Evaluation Patient Details Name: GIBRAN KLIETHERMES MRN: [id number] DOB: 1960/04/02 Today's Date: 08/13/2011 Time: [id number] SLP Time Calculation (min): 15 min  Problem List:  Patient Active Problem List  Diagnosis  . ESSENTIAL HYPERTENSION, BENIGN  . ESOPHAGEAL VARICES  . HEMATOCHEZIA  . END STAGE RENAL DISEASE  . Shortness of breath  . ESRD (end stage renal disease) on dialysis  . HTN (hypertension)  . H/O: CVA (cardiovascular accident)  . Pleural effusion  . Anemia  . Fluid overload  . History of nonadherence to medical treatment  . DM (diabetes mellitus)  . Tobacco abuse   Past Medical History:  Past Medical History  Diagnosis Date  . Hypertension   . Renal insufficiency   . Anemia   . Stroke   . Hemiparesis due to old stroke     left  . LV dysfunction     EF 25-30% by echo 07/2011  . MR (mitral regurgitation)     moderate to severe, echo 07/2011   Past Surgical History:  Past Surgical History  Procedure Date  . Kidney transplant     2011 rejected kidney 2012 back on dialysis  . Av fistula placement   . Cardiac catheterization     [name] medical   HPI:  51 y.o. male who was admitted to [facility] hospital for VATS/decorication for recurrent pleural effusion on 08/10/11. Patient has known ESRD with now non-functioning kidney transplant '11 ([facility]) and on dialysis, HTN, anemia, CVA LV dysfunction most recent echo 07/23/11 showing cavity size was normal. Systolic function was severely reduced. The estimated ejection fraction was in the range of 25% to 30%. Diffuse/generalized hypokinesis of severe degree. On 08/12/11 patient was noted to have confusion and difficulty. CT head was ordered on 08/13/11 which noted Wedge-shaped low attenuation area in the left parietal lobe with sulci effacement likely to represent acute infarct.     Assessment / Plan / Recommendation Clinical Impression  Patient presents with global aphasia characterized by difficulty following  multi-step commands, word finding deficits, and reading comprehesion impairement,  however very receptive to clinician cueing. Patient will benefit from SLP f/u in the acute care setting to faciliate improved communication. Recommend OP f/u after d/c as long as patient has 24 hour supervision unless communication significantly improves.     SLP Assessment  Patient needs continued Speech Lanaguage Pathology Services    Follow Up Recommendations  Outpatient SLP    Frequency and Duration min 2x/week  2 weeks   Pertinent Vitals/Pain n/a   SLP Goals  SLP Goals Potential to Achieve Goals: Good Progress/Goals/Alternative treatment plan discussed with pt/caregiver and they: Agree SLP Goal #1: Patient will verbalize needs/wants at the short phrase level with moderate phonemic cues for word finding errors SLP Goal #1 - Progress: Not met SLP Goal #2: Patient will follow multi-step commands during a basic functional ADL with min contextual cues SLP Goal #2 - Progress: Not met SLP Goal #3: Patient will match single word to objects in a field of 2-3 with min verbal cues SLP Goal #3 - Progress: Not met  SLP Evaluation Prior Functioning  Cognitive/Linguistic Baseline: Information not available   Cognition  Overall Cognitive Status: Impaired Arousal/Alertness: Awake/alert Orientation Level: Oriented to person;Oriented to place;Oriented to time;Oriented to situation Attention: Focused;Sustained Focused Attention: Appears intact Sustained Attention: Impaired Sustained Attention Impairment: Verbal complex;Functional complex Memory: Appears intact Awareness: Appears intact Problem Solving: Appears intact (for basic ADLS) Safety/Judgment: Impaired Comments: min verbal cueing required/reminders not to get up  on own given chest tubes    Comprehension  Auditory Comprehension Overall Auditory Comprehension: Impaired Yes/No Questions: Within Functional Limits Commands: Impaired One Step Basic  Commands: 75-100% accurate Two Step Basic Commands: 50-74% accurate Conversation: Simple Other Conversation Comments: requires minimal contextual and/or visual cues Interfering Components: Attention EffectiveTechniques: Repetition;Stressing words;Visual/Gestural cues Visual Recognition/Discrimination Discrimination: Within Function Limits Reading Comprehension Reading Status: Impaired Word level: Impaired Sentence Level: Within functional limits Paragraph Level: Not tested Functional Environmental (signs, name badge): Impaired Interfering Components:  (possible neglect, will continue to assess) Effective Techniques: Verbal cueing    Expression Expression Primary Mode of Expression: Verbal Verbal Expression Overall Verbal Expression: Impaired Initiation: No impairment Automatic Speech: Name;Social Response Level of Generative/Spontaneous Verbalization: Word;Phrase;Sentence Repetition: Impaired Level of Impairment: Phrase level Naming: Impairment Responsive: 51-75% accurate Confrontation: Impaired Divergent: 25-49% accurate Verbal Errors: Semantic paraphasias;Phonemic paraphasias;Neologisms;Perseveration Pragmatics: No impairment Effective Techniques: Phonemic cues;Sentence completion Non-Verbal Means of Communication: Gestures Written Expression Written Expression: Not tested   Oral / Motor Oral Motor/Sensory Function Overall Oral Motor/Sensory Function: Appears within functional limits for tasks assessed Motor Speech Overall Motor Speech: Appears within functional limits for tasks assessed   [place], [place] [phone]   Kiva Norland Meryl 08/13/2011, 2:48 PM

## 2011-08-13 NOTE — Progress Notes (Signed)
Chest xray complete

## 2011-08-13 NOTE — Progress Notes (Signed)
Heat CT complete, pt settled comfortable in bed.

## 2011-08-14 ENCOUNTER — Inpatient Hospital Stay (HOSPITAL_COMMUNITY): Payer: Medicare Other

## 2011-08-14 DIAGNOSIS — I6340 Cerebral infarction due to embolism of unspecified cerebral artery: Secondary | ICD-10-CM

## 2011-08-14 LAB — RENAL FUNCTION PANEL
Albumin: 2.3 g/dL — ABNORMAL LOW (ref 3.5–5.2)
GFR calc Af Amer: 5 mL/min — ABNORMAL LOW (ref >90)
GFR calc non Af Amer: 4 mL/min — ABNORMAL LOW (ref >90)
Glucose, Bld: 97 mg/dL (ref 70–99)
Phosphorus: 7.9 mg/dL — ABNORMAL HIGH (ref 2.3–4.6)
Potassium: 5.2 mEq/L — ABNORMAL HIGH (ref 3.5–5.1)
Sodium: 132 mEq/L — ABNORMAL LOW (ref 135–145)

## 2011-08-14 LAB — LIPID PANEL
HDL: 43 mg/dL (ref >39)
LDL Cholesterol: 45 mg/dL (ref 0–99)

## 2011-08-14 LAB — CBC
Hemoglobin: 9.5 g/dL — ABNORMAL LOW (ref 13.0–17.0)
MCHC: 32.5 g/dL (ref 30.0–36.0)

## 2011-08-14 LAB — HEMOGLOBIN A1C: Hgb A1c MFr Bld: 5.4 % (ref <5.7)

## 2011-08-14 MED ORDER — DARBEPOETIN ALFA-POLYSORBATE 40 MCG/0.4ML IJ SOLN
40.0000 ug | INTRAMUSCULAR | Status: DC
  Administered 2011-08-14: 40 ug via INTRAVENOUS
  Filled 2011-08-14: qty 0.4

## 2011-08-14 MED ORDER — DARBEPOETIN ALFA-POLYSORBATE 40 MCG/0.4ML IJ SOLN
INTRAMUSCULAR | Status: AC
  Filled 2011-08-14: qty 0.4

## 2011-08-14 NOTE — Progress Notes (Signed)
Stroke Team Progress Note  HISTORY Nathaniel White is an 51 y.o. male who was admitted to [facility] hospital for VATS/decorication for recurrent pleural effusion on 08/10/11. Patient has known ESRD with now non-functioning kidney transplant '11 ([facility]) and on dialysis, HTN, anemia, CVA LV dysfunction most recent echo 07/23/11 showing cavity size was normal. Systolic function was severely reduced. The estimated ejection fraction was in the range of 25% to 30%. Diffuse/generalized hypokinesis of severe degree.   08/10/11 patient underwent VATS/decorication for recurrent pleural effusion. On 08/12/11 patient was noted to have confusion and difficulty. CT head was ordered on 08/13/11 which noted Wedge-shaped low attenuation area in the left parietal lobe with sulci effacement likely to represent acute infarct. Neurology was called for consult.   Patient was not a TPA candidate secondary to unknown time of onset and recent OR.  SUBJECTIVE Two females are at the bedside, one is his mother.  Overall he feels his condition is stable. They report he has had ongoing confusion. Patient lived alone prior to admission. Family is concerned he may need short term rehab.  OBJECTIVE Most recent Vital Signs: Filed Vitals:   08/14/11 1258 08/14/11 1313 08/14/11 1359 08/14/11 1459  BP: 155/90 158/88  143/83  Pulse: 90 92  93  Temp:  97.4 F (36.3 C) 97.9 F (36.6 C) 97.9 F (36.6 C)  TempSrc:  Oral Oral Oral  Resp: 15 17  16   Height:      Weight:  73.7 kg (162 lb 7.7 oz)    SpO2: 100% 100%  95%   CBG (last 3)  Basename 08/12/11 0723 08/12/11 0339 08/11/11 2357  GLUCAP 94 112* 124*   Intake/Output from previous day: 08/05 0701 - 08/06 0700 In: -  Out: 2 [Stool:2]  IV Fluid Intake:     . bupivacaine ON-Q pain pump    . dextrose 5 % and 0.9% NaCl 20 mL/hr at 08/14/11 0600   MEDICATIONS    . amLODipine  10 mg Oral QHS  . bisacodyl  10 mg Oral Daily  . calcium carbonate  2 tablet Oral TID WC  . cefTAZidime  (FORTAZ)  IV  2 g Intravenous Q T,Th,Sat-1800  . cinacalcet  30 mg Oral Daily  . cloNIDine  0.3 mg Transdermal Q Tue  . clopidogrel  75 mg Oral Q breakfast  . darbepoetin      . darbepoetin (ARANESP) injection - DIALYSIS  40 mcg Intravenous Q Tue-HD  . guaiFENesin  600 mg Oral BID  . labetalol  150 mg Oral BID  . multivitamin  1 tablet Oral QHS  . sodium chloride  10-40 mL Intracatheter Q12H   PRN:  fentaNYL, heparin, ondansetron, potassium chloride, senna-docusate, sodium chloride, traMADol  Diet:  Renal thin liquids Activity:  ambulate DVT Prophylaxis:  SCDs   CLINICALLY SIGNIFICANT STUDIES Basic Metabolic Panel:  Lab 123XX123 0906 08/13/11 0400  NA 132* 137  K 5.2* 4.3  CL 92* 95*  CO2 23 27  GLUCOSE 97 93  BUN 56* 41*  CREATININE 11.67* 9.38*  CALCIUM 8.5 8.6  MG -- --  PHOS 7.9* --   Liver Function Tests:  Lab 08/14/11 0906 08/12/11 0355 08/08/11 1202  AST -- 16 18  ALT -- 8 10  ALKPHOS -- 59 65  BILITOT -- 0.3 0.3  PROT -- 6.8 7.3  ALBUMIN 2.3* 2.5* --   CBC:  Lab 08/14/11 0906 08/13/11 0400  WBC 6.5 6.6  NEUTROABS -- --  HGB 9.5* 10.2*  HCT 29.2*  32.0*  MCV 87.7 87.4  PLT 139* 127*   Coagulation:  Lab 08/08/11 1202  LABPROT 13.6  INR 1.02   Cardiac Enzymes: No results found for this basename: CKTOTAL:3,CKMB:3,CKMBINDEX:3,TROPONINI:3 in the last 168 hours Urinalysis: No results found for this basename: COLORURINE:2,APPERANCEUR:2,LABSPEC:2,PHURINE:2,GLUCOSEU:2,HGBUR:2,BILIRUBINUR:2,KETONESUR:2,PROTEINUR:2,UROBILINOGEN:2,NITRITE:2,LEUKOCYTESUR:2 in the last 168 hours Lipid Panel    Component Value Date/Time   CHOL 102 08/14/2011 0400   TRIG 71 08/14/2011 0400   HDL 43 08/14/2011 0400   CHOLHDL 2.4 08/14/2011 0400   VLDL 14 08/14/2011 0400   LDLCALC 45 08/14/2011 0400   HgbA1C  Lab Results  Component Value Date   HGBA1C 5.4 08/13/2011   Urine Drug Screen:   No results found for this basename: labopia,  cocainscrnur,  labbenz,  amphetmu,  thcu,  labbarb      Alcohol Level: No results found for this basename: ETH:2 in the last 168 hours  CT of the brain  08/13/2011  Wedge-shaped low attenuation area in the left parietal lobe with sulci effacement likely to represent acute infarct.  No evidence of acute intracranial hemorrhage.   MRI/MRA of the brain  Unable to perform secondary to clips used during OR this admission  2D Echocardiogram  07/23/2011 EF 25%-30%. Diffuse/generalized hypokinesis of severe degree. Doppler parameters are consistent with a reversible restrictive pattern, indicative of decreased left ventricular diastolic compliance and/or increased left atrial pressure (grade 3 diastolic dysfunction). Mitral valve: Moderately calcified annulus. Moderate to severe regurgitation directed centrally.  Carotid Doppler  No internal carotid artery stenosis bilaterally. Vertebrals with antegrade flow bilaterally.   CXR   08/14/2011 1.  Slight improvement in aeration in the left lower lobe, suggesting some resolving pneumonia or aspiration pneumonitis.  The radiographic appearance of the chest is otherwise essentially unchanged, as above.   08/13/2011   1.  Stable positioning of support apparatus.  No pneumothorax. 2.  Minimally improved aeration of the right mid and lower lung with persistent bilateral heterogeneous air space opacities, atelectasis versus infiltrate.   EKG  normal sinus rhythm, nonspecific ST and T waves changes, incomplete LBBB, LVH, LA enlargement, occasional PVC noted, unifocal (new).   Therapy Recommendations PT - ; OT - ; ST - OP  Physical Exam    GENERAL EXAM: Patient is in no distress  CARDIOVASCULAR: Regular rate and rhythm, no murmurs, no carotid bruits  NEUROLOGIC: MENTAL STATUS: awake, alert, language NOTABLE FOR MILD/MOD EXP APHASIA. CANNOT NAME OR READ. PARAPHASIC ERRORS.  CRANIAL NERVE: pupils equal and reactive to light, visual fields full to confrontation, extraocular muscles intact, no nystagmus, facial sensation  and strength symmetric, uvula midline, shoulder shrug symmetric, tongue midline. MOTOR: normal bulk and tone, full strength in the BUE, BLE SENSORY: normal and symmetric to light touch, pinprick COORDINATION: finger-nose-finger, fine finger movements normal REFLEXES: deep tendon reflexes present and symmetric GAIT/STATION: SITTING AT BEDSIDE   ASSESSMENT Mr. Nathaniel White is a 51 y.o. male with an embolic left parietal lobe infarct s/p VATS/decortication R lung for chronic pleural effusion 08/10/2011. Infarct embolic secondary to low EF vs other cardioembolic source.  On aspirin 81 mg orally every day prior to admission. Now on clopidogrel 75 mg orally every day for secondary stroke prevention. Patient with resultant expressive and receptive aphasia.   -hypertension -anemia -ESRD with kidney transplant w/ resultant rejection, back on HD 2012, TTS scheule -previous stroke, left hemiparesis face, arm and leg -LV dysfunction, EF 23-30% -MR, moderate to severe  Hospital day # 4  TREATMENT/PLAN -Continue clopidogrel 75 mg orally every  day for secondary stroke prevention. -avoid heparin during HD -Recommend TEE to look for cardioembolic source of stroke. Will defer timing to Dr. Lawson Fiscal. We will make arrangements if ok to do in the next few days. -PT and OT evals  -OP ST language/cognition -? Short term SNF vs CIR prior to return home  Burnetta Sabin, MSN, RN, ANVP-BC, ANP-BC, [facility] Pager: [phone] 08/14/2011 3:20 PM  Scribe for Dr. Andrey Spearman who has personally reviewed chart, pertinent data, examined the patient and developed the plan of care.  [facility] - Stroke Team Andrey Spearman, MD 08/14/2011, 6:50 PM   Please refer to [email or web address] for on-call Stroke MD

## 2011-08-14 NOTE — Progress Notes (Signed)
Subjective:  Has had part of work up for CVA, swallowing was OK.  Still having difficulty with memory and finding words.  Objective Vital signs in last 24 hours: Filed Vitals:   08/13/11 1548 08/13/11 2015 08/14/11 0009 08/14/11 0439  BP: 133/89 129/88 108/72 113/80  Pulse:  97 86 91  Temp: 97.9 F (36.6 C) 98.2 F (36.8 C) 97.8 F (36.6 C) 97.8 F (36.6 C)  TempSrc: Oral Oral Oral Oral  Resp:  29 18 26   Height:      Weight:      SpO2:  95% 98% 96%   Weight change:   Intake/Output Summary (Last 24 hours) at 08/14/11 0743 Last data filed at 08/14/11 0400  Gross per 24 hour  Intake      0 ml  Output      2 ml  Net     -2 ml   Labs: Basic Metabolic Panel:  Lab [id number] 0400 08/12/11 0355 08/11/11 0406  NA 137 130* 135  K 4.3 5.4* 5.2*  CL 95* 90* 95*  CO2 27 24 25   GLUCOSE 93 107* 93  BUN 41* 60* 39*  CREATININE 9.38* 11.74* 9.43*  CALCIUM 8.6 8.9 9.1  ALB -- -- --  PHOS -- -- --   Liver Function Tests:  Lab 08/12/11 0355 08/08/11 1202  AST 16 18  ALT 8 10  ALKPHOS 59 65  BILITOT 0.3 0.3  PROT 6.8 7.3  ALBUMIN 2.5* 2.9*   No results found for this basename: LIPASE:3,AMYLASE:3 in the last 168 hours No results found for this basename: AMMONIA:3 in the last 168 hours CBC:  Lab 08/13/11 0400 08/12/11 0355 08/11/11 0406 08/08/11 1202  WBC 6.6 7.6 4.9 --  NEUTROABS -- -- -- --  HGB 10.2* 10.6* 10.2* --  HCT 32.0* 34.3* 33.2* --  MCV 87.4 88.9 89.2 88.6  PLT 127* 160 128* --   Cardiac Enzymes: No results found for this basename: CKTOTAL:5,CKMB:5,CKMBINDEX:5,TROPONINI:5 in the last 168 hours CBG:  Lab 08/12/11 0723 08/12/11 0339 08/11/11 2357 08/11/11 2015 08/11/11 1550  GLUCAP 94 112* 124* 123* 111*    Iron Studies: No results found for this basename: IRON,TIBC,TRANSFERRIN,FERRITIN in the last 72 hours Studies/Results: Ct Head Wo Contrast  08/13/2011  *RADIOLOGY REPORT*  Clinical Data: Altered mental status.  CT HEAD WITHOUT CONTRAST  Technique:   Contiguous axial images were obtained from the base of the skull through the vertex without contrast.  Comparison: None.  Findings: The diffuse cerebral atrophy.  Low attenuation changes in the deep white matter consistent with small vessel ischemia and old lacunar infarct.  Wedge-shaped poorly defined low attenuation focus in the left parietal lobe with sulci effacement consistent with acute infarct.  No midline shift.  No evidence of acute intracranial hemorrhage. No abnormal extra-axial fluid collections. Basal cisterns are not effaced.  Visualized mastoid air cells and paranasal sinuses are not opacified.  No depressed skull fractures. Vascular calcifications.  IMPRESSION: Wedge-shaped low attenuation area in the left parietal lobe with sulci effacement likely to represent acute infarct.  No evidence of acute intracranial hemorrhage.  Original Report Authenticated By: Neale Burly, M.D.   Dg Chest Port 1 View  08/14/2011  *RADIOLOGY REPORT*  Clinical Data: Evaluate chest tubes.  PORTABLE CHEST - 1 VIEW  Comparison: Chest x-ray 08/13/2011.  Findings: There is a left-sided internal jugular central venous catheter with tip terminating at the confluence of the left innominate and superior vena cava. Two right-sided chest tubes remain in position  with tips and side ports projecting over the right hemithorax.  Small persistent right-sided pleural effusion similar to yesterday's examination.  Extensive architectural distortion and airspace disease in the right mid and lower lung is unchanged.  Ill-defined airspace opacity in the left lower lobe slightly improved.  Mild - moderate enlargement of the cardiopericardial silhouette is at again noted. The patient is rotated to the right on today's exam, resulting in distortion of the mediastinal contours and reduced diagnostic sensitivity and specificity for mediastinal pathology.  Atherosclerosis in the thoracic aorta.  IMPRESSION: 1.  Slight improvement in aeration  in the left lower lobe, suggesting some resolving pneumonia or aspiration pneumonitis.  The radiographic appearance of the chest is otherwise essentially unchanged, as above.  Original Report Authenticated By: Etheleen Mayhew, M.D.   Dg Chest Port 1 View  08/13/2011  *RADIOLOGY REPORT*  Clinical Data: Post vats  PORTABLE CHEST - 1 VIEW  Comparison: 08/12/2011; 08/11/2011; 08/10/2011; chest CT - 07/23/2011  Findings:  Grossly unchanged enlarged cardiac silhouette and mediastinal contours.  Stable position of support apparatus including two right- sided chest tubes. Grossly unchanged small residual right-sided pleural effusion with fluid layering along the right lung apex.  No definite pneumothorax.  Minimal improved aeration of the right mid and lower lung with persistent bilateral heterogeneous airspace opacities.  No new focal airspace opacities.  Unchanged bones.  IMPRESSION: 1.  Stable positioning of support apparatus.  No pneumothorax. 2.  Minimally improved aeration of the right mid and lower lung with persistent bilateral heterogeneous air space opacities, atelectasis versus infiltrate.  Original Report Authenticated By: Rachel Moulds, M.D.   Medications: Infusions:    . bupivacaine ON-Q pain pump    . dextrose 5 % and 0.9% NaCl 20 mL/hr at 08/14/11 0600    Scheduled Medications:    . amLODipine  10 mg Oral QHS  . bisacodyl  10 mg Oral Daily  . calcium carbonate  2 tablet Oral TID WC  . cefTAZidime (FORTAZ)  IV  2 g Intravenous Q T,Th,Sat-1800  . cinacalcet  30 mg Oral Daily  . cloNIDine  0.3 mg Transdermal Q Tue  . clopidogrel  75 mg Oral Q breakfast  . guaiFENesin  600 mg Oral BID  . labetalol  150 mg Oral BID  . multivitamin  1 tablet Oral QHS  . sodium chloride  10-40 mL Intracatheter Q12H  . DISCONTD: aspirin EC  81 mg Oral Daily    have reviewed scheduled and prn medications.  Physical Exam: General: alert, still having difficulty finding words Heart: RRR Lungs:  sounds clearer Abdomen: soft, non tender Extremities: no edema Dialysis Access: L AVF, good thrill and bruit   I Assessment/ Plan:  Pt is a 51 y.o. yo male ESRD who was admitted on 08/10/2011 with  VATS/decortication of  R lung- now CVA Assessment/Plan: 1. S/p VATS/decortication of R lung for chronic pleural effusion - per VVS, CT in place  2. ESRD - normally TTS at AF.  HD via L AVF.  Had HD on Sunday make up for Saturday.  Next will be due today to keep on TTS schedule 3. Anemia- no aranesp for now, will add 4. Secondary hyperparathyroidism- continue TUMs and sensipar.   No vitamin D listed, will get meds from OP kidney center and adjust.   5. HTN/volume- reasonable.  Continue home BP meds (norvasc 10/clonidine/labetalol)  6. Likely new CVA- per primary team. Carotids are negative.  Cannot get MRI. Neuro and therapies now involved,  maybe a little bit better than yesterday.   Shylee Durrett A   08/14/2011,7:43 AM  LOS: 4 days

## 2011-08-14 NOTE — Progress Notes (Signed)
Pt back from HD, family bedside. There is only one contact person in town. The rest of the family lives out of town. He may need SW/CM consult depending on d/c status.

## 2011-08-14 NOTE — Procedures (Signed)
Patient was seen on dialysis and the procedure was supervised.  BFR 400  Via AVF BP is  138/79.   Patient appears to be tolerating treatment well  Dietrich Ke A 08/14/2011

## 2011-08-14 NOTE — Progress Notes (Addendum)
4 Days Post-Op Procedure(s) (LRB): VIDEO ASSISTED THORACOSCOPY (VATS)/DECORTICATION (Right)  Subjective: Patient on hemodialysis.Answered most questions appropriately-did not know the year.Has  difficulty with his memory as well as articulating some words.  Objective: Vital signs in last 24 hours: Patient Vitals for the past 24 hrs:  BP Temp Temp src Pulse Resp SpO2 Weight  08/14/11 1230 151/91 mmHg - - 90  14  100 % -  08/14/11 1200 158/86 mmHg - - 81  11  100 % -  08/14/11 1130 149/81 mmHg - - 80  13  100 % -  08/14/11 1100 154/86 mmHg - - 80  12  100 % -  08/14/11 1030 153/88 mmHg - - 89  12  100 % -  08/14/11 1000 144/84 mmHg - - 81  12  100 % -  08/14/11 0930 140/82 mmHg - - 90  12  100 % -  08/14/11 0900 139/81 mmHg - - 90  14  98 % -  08/14/11 0858 138/79 mmHg - - 89  15  99 % -  08/14/11 0855 - - - - 15  98 % -  08/14/11 0853 143/88 mmHg 97.5 F (36.4 C) Oral 93  14  97 % -  08/14/11 0847 137/83 mmHg 97.9 F (36.6 C) Oral 96  14  97 % 169 lb 8.5 oz (76.9 kg)  08/14/11 0804 - 97.7 F (36.5 C) Oral - - - -  08/14/11 0743 130/84 mmHg - - - - - -  08/14/11 0439 113/80 mmHg 97.8 F (36.6 C) Oral 91  26  96 % -  08/14/11 0009 108/72 mmHg 97.8 F (36.6 C) Oral 86  18  98 % -  08/13/11 2015 129/88 mmHg 98.2 F (36.8 C) Oral 97  29  95 % -  08/13/11 1548 133/89 mmHg 97.9 F (36.6 C) Oral - - - -  08/13/11 1500 - - - 90  19  98 % -  08/13/11 1400 - - - 96  22  95 % -  08/13/11 1300 - - - 99  22  93 % -      Intake/Output from previous day: 08/05 0701 - 08/06 0700 In: -  Out: 2 [Stool:2]   Physical Exam:  Cardiovascular: RRR Pulmonary: Diminished at bases; no rales, wheezes, or rhonchi. Abdomen: Soft, non tender, bowel sounds present. Wounds: Dressings are clean and dry.   Chest Tube:Minor tidling in the chest tube with cough.  Lab Results: CBC: Basename 08/14/11 0906 08/13/11 0400  WBC 6.5 6.6  HGB 9.5* 10.2*  HCT 29.2* 32.0*  PLT 139* 127*   BMET:    Basename 08/14/11 0906 08/13/11 0400  NA 132* 137  K 5.2* 4.3  CL 92* 95*  CO2 23 27  GLUCOSE 97 93  BUN 56* 41*  CREATININE 11.67* 9.38*  CALCIUM 8.5 8.6    PT/INR: No results found for this basename: LABPROT,INR in the last 72 hours ABG:  INR: Will add last result for INR, ABG once components are confirmed Will add last 4 CBG results once components are confirmed  Assessment/Plan:  1. CV - SR.BP medications per nephrology. 2.  Pulmonary - CXR this am shows patient rotated to the right,small right pleural effusion, cardiomegaly, and opacity left lower lobe.Chest tube with minor output. There is some tidling in the chest tube with cough but no obvious leak.Hope to remove one chest tube soon. 3.Probable CVA-no significant internal carotid artery stenosis.Unable to have MRI at this time.Appreciate neurology  and speech pathology evaluations. 4.Anemia-H and H 9.5 and 29.2.On Aranesp.Related to ESRD.  ZIMMERMAN,DONIELLE MPA-C 08/14/2011  Patient examined chest x-ray reviewed agree with above note We'll discontinue posterior chest tube and lead anterior chest tube to suction for the next 48 hours. Operative cultures nonrevealing

## 2011-08-15 ENCOUNTER — Inpatient Hospital Stay (HOSPITAL_COMMUNITY): Payer: Medicare Other

## 2011-08-15 MED ORDER — CALCIUM CARBONATE ANTACID 500 MG PO CHEW
3.0000 | CHEWABLE_TABLET | Freq: Three times a day (TID) | ORAL | Status: DC
  Administered 2011-08-15 – 2011-08-19 (×7): 600 mg via ORAL
  Filled 2011-08-15 (×15): qty 3

## 2011-08-15 NOTE — Progress Notes (Signed)
Speech Language Pathology Treatment Patient Details Name: Nathaniel White MRN: [id number] DOB: 02/18/1960 Today's Date: 08/15/2011 Time: 1610-1630 SLP Time Calculation (min): 20 min  Assessment / Plan / Recommendation Clinical Impression  Pt presents with evolving/improving aphasia marked by improving fluency and sentence-length, improving grammatical form and comprehension.  Continues to present with word-retrieval deficits.  With provision of sentence cue + initial phoneme, pt with 80% accuracy on tasks of confrontational naming.  Min verbal cues required for reading task at single-word level.  Word discrim tasks completed with 75% accuracy with mod assistance.  Pt inquiring re: prognosis with regard to speech and f/u - discussed the need for OP SLP upon D/C.    SLP Plan  Continue with current plan of care       SLP Goals  SLP Goals Potential to Achieve Goals: Good Progress/Goals/Alternative treatment plan discussed with pt/caregiver and they: Agree SLP Goal #1: Patient will verbalize needs/wants at the short phrase level with moderate phonemic cues for word finding errors SLP Goal #1 - Progress: Progressing toward goal SLP Goal #2: Patient will follow multi-step commands during a basic functional ADL with min contextual cues SLP Goal #2 - Progress: Progressing toward goal SLP Goal #3: Patient will match single word to objects in a field of 2-3 with min verbal cues SLP Goal #3 - Progress: Progressing toward goal   Treatment Treatment focused on: Aphasia Skilled Treatment: word retrieval, reading discrim   Earnest Thalman L. Tivis Ringer, [place] CCC/SLP Pager [phone]      Juan Quam Laurice 08/15/2011, 4:37 PM

## 2011-08-15 NOTE — Progress Notes (Addendum)
[address]            [place] [address]          [phone]     5 Days Post-Op Procedure(s) (LRB): VIDEO ASSISTED THORACOSCOPY (VATS)/DECORTICATION (Right)  Subjective: Stable, no complaints.   Objective: Vital signs in last 24 hours: Patient Vitals for the past 24 hrs:  BP Temp Temp src Pulse Resp SpO2 Weight  08/15/11 0800 130/78 mmHg 97.8 F (36.6 C) Oral 90  16  93 % -  08/15/11 0500 - - - 81  24  96 % -  08/15/11 0400 - 98 F (36.7 C) Oral 83  15  98 % -  08/15/11 0300 - - - 85  28  94 % -  08/15/11 0200 - - - 88  28  99 % -  08/15/11 0100 - - - 88  17  96 % -  08/15/11 0005 - 98.8 F (37.1 C) Oral - - - -  08/15/11 0000 - - - 94  17  95 % -  08/14/11 2338 135/79 mmHg - - 93  16  97 % -  08/14/11 2000 - 98.1 F (36.7 C) Oral - - - -  08/14/11 1459 143/83 mmHg 97.9 F (36.6 C) Oral 93  16  95 % -  08/14/11 1359 - 97.9 F (36.6 C) Oral - - - -  08/14/11 1313 158/88 mmHg 97.4 F (36.3 C) Oral 92  17  100 % 162 lb 7.7 oz (73.7 kg)  08/14/11 1258 155/90 mmHg - - 90  15  100 % -  08/14/11 1245 159/95 mmHg - - 94  15  100 % -  08/14/11 1230 151/91 mmHg - - 90  14  100 % -  08/14/11 1200 158/86 mmHg - - 81  11  100 % -  08/14/11 1130 149/81 mmHg - - 80  13  100 % -  08/14/11 1100 154/86 mmHg - - 80  12  100 % -  08/14/11 1030 153/88 mmHg - - 89  12  100 % -  08/14/11 1000 144/84 mmHg - - 81  12  100 % -  08/14/11 0930 140/82 mmHg - - 90  12  100 % -  08/14/11 0900 139/81 mmHg - - 90  14  98 % -  08/14/11 0858 138/79 mmHg - - 89  15  99 % -  08/14/11 0855 - - - - 15  98 % -  08/14/11 0853 143/88 mmHg 97.5 F (36.4 C) Oral 93  14  97 % -  08/14/11 0847 137/83 mmHg 97.9 F (36.6 C) Oral 96  14  97 % 169 lb 8.5 oz (76.9 kg)   Current Weight  08/14/11 162 lb 7.7 oz (73.7 kg)     Intake/Output from previous day: 08/06 0701 - 08/07 0700 In: 1220 [P.O.:260; I.V.:960] Out: 3015 [Chest Tube:15]    PHYSICAL EXAM:  Heart:  RRR Lungs: clear, slightly decreased in bases Wound: clean and dry Chest tube: no obvious air leak Neuro: stable, some aphasia    Lab Results: CBC: Basename 08/14/11 0906 08/13/11 0400  WBC 6.5 6.6  HGB 9.5* 10.2*  HCT 29.2* 32.0*  PLT 139* 127*   BMET:  Basename 08/14/11 0906 08/13/11 0400  NA 132* 137  K 5.2* 4.3  CL 92* 95*  CO2 23 27  GLUCOSE 97 93  BUN 56* 41*  CREATININE 11.67* 9.38*  CALCIUM 8.5 8.6    PT/INR: No results found for this basename: LABPROT,INR in the last 72 hours  CXR: Findings: One of the patient's two right chest tubes has been  removed. There is some increased lucency in the periphery of the  right lung base which may represent a small loculated pneumothorax.  Pleural and parenchymal opacities in the right mid and lower lung  zones are unchanged. Left lung is clear. There is cardiomegaly.  Left IJ catheter is again noted.  IMPRESSION:  Status post removal of one of two right chest tubes. There may be  a small loculated right basilar pneumothorax. No other change.    Assessment/Plan: S/P Procedure(s) (LRB): VIDEO ASSISTED THORACOSCOPY (VATS)/DECORTICATION (Right)  CT output minimal, no obvious leak.  Continue remaining CT today. Hopefully can d/c CT soon.  ID- intraop cx's negative.   ESRD- per renal.  CVA- neuro following.  PT, speech tx to see pt.   Continue current care.  LOS: 5 days    COLLINS,GINA H 08/15/2011   patient examined and medical record reviewed,agree with above note. VAN TRIGT III,PETER 08/15/2011

## 2011-08-15 NOTE — Progress Notes (Signed)
Stroke Team Progress Note  HISTORY Nathaniel White is an 51 y.o. male who was admitted to [facility] hospital for VATS/decorication for recurrent pleural effusion on 08/10/11. Patient has known ESRD with now non-functioning kidney transplant '11 ([facility]) and on dialysis, HTN, anemia, CVA LV dysfunction most recent echo 07/23/11 showing cavity size was normal. Systolic function was severely reduced. The estimated ejection fraction was in the range of 25% to 30%. Diffuse/generalized hypokinesis of severe degree.   08/10/11 patient underwent VATS/decorication for recurrent pleural effusion. On 08/12/11 patient was noted to have confusion and difficulty. CT head was ordered on 08/13/11 which noted Wedge-shaped low attenuation area in the left parietal lobe with sulci effacement likely to represent acute infarct. Neurology was called for consult.   Patient was not a TPA candidate secondary to unknown time of onset and recent OR.  SUBJECTIVE The patient indicates that he is feeling well, no headache, visual disturbance, problems with swallowing, or numbness or weakness of the face, arms, or legs. No family is in the room. The patient indicates that he lives with his girlfriend.   OBJECTIVE Most recent Vital Signs: Filed Vitals:   08/15/11 0300 08/15/11 0400 08/15/11 0500 08/15/11 0732  BP:      Pulse: 85 83 81   Temp:  98 F (36.7 C)  97.8 F (36.6 C)  TempSrc:  Oral  Oral  Resp: 28 15 24    Height:      Weight:      SpO2: 94% 98% 96%    CBG (last 3) No results found for this basename: GLUCAP:3 in the last 72 hours Intake/Output from previous day: 08/06 0701 - 08/07 0700 In: 1200 [P.O.:260; I.V.:940] Out: 3015 [Chest Tube:15]  IV Fluid Intake:     . dextrose 5 % and 0.9% NaCl 20 mL/hr at 08/14/11 0600   MEDICATIONS    . amLODipine  10 mg Oral QHS  . bisacodyl  10 mg Oral Daily  . calcium carbonate  3 tablet Oral TID WC  . cefTAZidime (FORTAZ)  IV  2 g Intravenous Q T,Th,Sat-1800  . cinacalcet  30 mg  Oral Daily  . cloNIDine  0.3 mg Transdermal Q Tue  . clopidogrel  75 mg Oral Q breakfast  . darbepoetin      . darbepoetin (ARANESP) injection - DIALYSIS  40 mcg Intravenous Q Tue-HD  . guaiFENesin  600 mg Oral BID  . labetalol  150 mg Oral BID  . multivitamin  1 tablet Oral QHS  . sodium chloride  10-40 mL Intracatheter Q12H  . DISCONTD: calcium carbonate  2 tablet Oral TID WC   PRN:  fentaNYL, heparin, ondansetron, potassium chloride, senna-docusate, sodium chloride, traMADol  Diet:  Renal thin liquids Activity:  ambulate DVT Prophylaxis:  SCDs   CLINICALLY SIGNIFICANT STUDIES Basic Metabolic Panel:  Lab [id number] 0906 08/13/11 0400  NA 132* 137  K 5.2* 4.3  CL 92* 95*  CO2 23 27  GLUCOSE 97 93  BUN 56* 41*  CREATININE 11.67* 9.38*  CALCIUM 8.5 8.6  MG -- --  PHOS 7.9* --   Liver Function Tests:  Lab 08/14/11 0906 08/12/11 0355 08/08/11 1202  AST -- 16 18  ALT -- 8 10  ALKPHOS -- 59 65  BILITOT -- 0.3 0.3  PROT -- 6.8 7.3  ALBUMIN 2.3* 2.5* --   CBC:  Lab 08/14/11 0906 08/13/11 0400  WBC 6.5 6.6  NEUTROABS -- --  HGB 9.5* 10.2*  HCT 29.2* 32.0*  MCV 87.7 87.4  PLT 139* 127*   Coagulation:  Lab 08/08/11 1202  LABPROT 13.6  INR 1.02   Lipid Panel    Component Value Date/Time   CHOL 102 08/14/2011 0400   TRIG 71 08/14/2011 0400   HDL 43 08/14/2011 0400   CHOLHDL 2.4 08/14/2011 0400   VLDL 14 08/14/2011 0400   LDLCALC 45 08/14/2011 0400   HgbA1C  Lab Results  Component Value Date   HGBA1C 5.4 08/13/2011   Urine Drug Screen:   No results found for this basename: labopia,  cocainscrnur,  labbenz,  amphetmu,  thcu,  labbarb    Alcohol Level: No results found for this basename: ETH:2 in the last 168 hours  CT of the brain  08/13/2011  Wedge-shaped low attenuation area in the left parietal lobe with sulci effacement likely to represent acute infarct.  No evidence of acute intracranial hemorrhage.   MRI/MRA of the brain  Unable to perform secondary to clips used  during OR this admission  2D Echocardiogram  07/23/2011 EF 25%-30%. Diffuse/generalized hypokinesis of severe degree. Doppler parameters are consistent with a reversible restrictive pattern, indicative of decreased left ventricular diastolic compliance and/or increased left atrial pressure (grade 3 diastolic dysfunction). Mitral valve: Moderately calcified annulus. Moderate to severe regurgitation directed centrally.  Carotid Doppler  No internal carotid artery stenosis bilaterally. Vertebrals with antegrade flow bilaterally.   CXR   08/14/2011 1.  Slight improvement in aeration in the left lower lobe, suggesting some resolving pneumonia or aspiration pneumonitis.  The radiographic appearance of the chest is otherwise essentially unchanged, as above.   08/13/2011   1.  Stable positioning of support apparatus.  No pneumothorax. 2.  Minimally improved aeration of the right mid and lower lung with persistent bilateral heterogeneous air space opacities, atelectasis versus infiltrate.   EKG  normal sinus rhythm, nonspecific ST and T waves changes, incomplete LBBB, LVH, LA enlargement, occasional PVC noted, unifocal (new).   Therapy Recommendations PT - ; OT - ; ST - OP  Physical Exam    The patient is alert and cooperative at the time of examination. Mild left facial droop is seen.  Extraocular movements are full, the patient has a mild relative right homonymous visual field deficit. The patient has a mild aphasia, difficulty with naming, and slight difficulty with repetition.  The patient has good strength of all 4 extremities, but mild spasticity is seen on the left hand and foot.  The patient has good finger-nose-finger and toe to finger bilaterally. Gait was not tested.  Deep tendon reflexes are symmetric, toes are neutral bilaterally.      ASSESSMENT Mr. Nathaniel White is a 51 y.o. male with an embolic left parietal lobe infarct s/p VATS/decortication R lung for chronic pleural effusion  08/10/2011. Infarct embolic secondary to low EF vs other cardioembolic source.  On aspirin 81 mg orally every day prior to admission. Now on clopidogrel 75 mg orally every day for secondary stroke prevention. Patient with resultant expressive and receptive aphasia.   -hypertension -anemia -ESRD with kidney transplant w/ resultant rejection, back on HD 2012, TTS scheule -previous stroke, left hemiparesis face, arm and leg -LV dysfunction, EF 23-30% -MR, moderate to severe  Given the low EF by 2D echo, cardioembolic source of CVA is likely. Hospital day # 5  TREATMENT/PLAN -Continue clopidogrel 75 mg orally every day for secondary stroke prevention. -avoid heparin during HD -Recommend TEE to look for cardioembolic source of stroke. Will defer timing to Dr. Lawson Fiscal. We will make arrangements  if ok to do in the next few days. -PT and OT evals  -OP ST language/cognition -? Short term SNF vs CIR prior to return home  Burnetta Sabin, MSN, RN, ANVP-BC, ANP-BC, GNP-BC [facility] Pager: [phone] 08/15/2011 8:19 AM  Scribe for Dr. Floyde Parkins who has personally reviewed chart, pertinent data, examined the patient and developed the plan of care.  Please refer to [email or web address] for on-call Stroke MD  Lenor Coffin

## 2011-08-15 NOTE — Progress Notes (Signed)
Subjective:  HD yest, negative 3 liters. CT has been removed.  Is frustrated at his inability to communicate Objective Vital signs in last 24 hours: Filed Vitals:   08/15/11 0300 08/15/11 0400 08/15/11 0500 08/15/11 0732  BP:      Pulse: 85 83 81   Temp:  98 F (36.7 C)  97.8 F (36.6 C)  TempSrc:  Oral  Oral  Resp: 28 15 24    Height:      Weight:      SpO2: 94% 98% 96%    Weight change:   Intake/Output Summary (Last 24 hours) at 08/15/11 0805 Last data filed at 08/15/11 0733  Gross per 24 hour  Intake   1200 ml  Output   3010 ml  Net  -1810 ml   Labs: Basic Metabolic Panel:  Lab [id number] 0906 08/13/11 0400 08/12/11 0355  NA 132* 137 130*  K 5.2* 4.3 5.4*  CL 92* 95* 90*  CO2 23 27 24   GLUCOSE 97 93 107*  BUN 56* 41* 60*  CREATININE 11.67* 9.38* 11.74*  CALCIUM 8.5 8.6 8.9  ALB -- -- --  PHOS 7.9* -- --   Liver Function Tests:  Lab 08/14/11 0906 08/12/11 0355 08/08/11 1202  AST -- 16 18  ALT -- 8 10  ALKPHOS -- 59 65  BILITOT -- 0.3 0.3  PROT -- 6.8 7.3  ALBUMIN 2.3* 2.5* 2.9*   No results found for this basename: LIPASE:3,AMYLASE:3 in the last 168 hours No results found for this basename: AMMONIA:3 in the last 168 hours CBC:  Lab 08/14/11 0906 08/13/11 0400 08/12/11 0355 08/11/11 0406 08/08/11 1202  WBC 6.5 6.6 7.6 -- --  NEUTROABS -- -- -- -- --  HGB 9.5* 10.2* 10.6* -- --  HCT 29.2* 32.0* 34.3* -- --  MCV 87.7 87.4 88.9 89.2 88.6  PLT 139* 127* 160 -- --   Cardiac Enzymes: No results found for this basename: CKTOTAL:5,CKMB:5,CKMBINDEX:5,TROPONINI:5 in the last 168 hours CBG:  Lab 08/12/11 0723 08/12/11 0339 08/11/11 2357 08/11/11 2015 08/11/11 1550  GLUCAP 94 112* 124* 123* 111*    Iron Studies: No results found for this basename: IRON,TIBC,TRANSFERRIN,FERRITIN in the last 72 hours Studies/Results: Dg Chest Port 1 View  08/15/2011  *RADIOLOGY REPORT*  Clinical Data: Status post right decortication.  Chest tube.  PORTABLE CHEST - 1 VIEW   Comparison: Plain film chest 08/14/2011.  Findings: One of the patient's two right chest tubes has been removed.  There is some increased lucency in the periphery of the right lung base which may represent a small loculated pneumothorax. Pleural and parenchymal opacities in the right mid and lower lung zones are unchanged.  Left lung is clear.  There is cardiomegaly. Left IJ catheter is again noted.  IMPRESSION:  Status post removal of one of two right chest tubes.  There may be a small loculated right basilar pneumothorax.  No other change.  Original Report Authenticated By: Arvid Right. Luther Parody, M.D.   Dg Chest Port 1 View  08/14/2011  *RADIOLOGY REPORT*  Clinical Data: Evaluate chest tubes.  PORTABLE CHEST - 1 VIEW  Comparison: Chest x-ray 08/13/2011.  Findings: There is a left-sided internal jugular central venous catheter with tip terminating at the confluence of the left innominate and superior vena cava. Two right-sided chest tubes remain in position with tips and side ports projecting over the right hemithorax.  Small persistent right-sided pleural effusion similar to yesterday's examination.  Extensive architectural distortion and airspace disease in the right mid  and lower lung is unchanged.  Ill-defined airspace opacity in the left lower lobe slightly improved.  Mild - moderate enlargement of the cardiopericardial silhouette is at again noted. The patient is rotated to the right on today's exam, resulting in distortion of the mediastinal contours and reduced diagnostic sensitivity and specificity for mediastinal pathology.  Atherosclerosis in the thoracic aorta.  IMPRESSION: 1.  Slight improvement in aeration in the left lower lobe, suggesting some resolving pneumonia or aspiration pneumonitis.  The radiographic appearance of the chest is otherwise essentially unchanged, as above.  Original Report Authenticated By: Etheleen Mayhew, M.D.   Medications: Infusions:    . dextrose 5 % and 0.9% NaCl  20 mL/hr at 08/14/11 0600    Scheduled Medications:    . amLODipine  10 mg Oral QHS  . bisacodyl  10 mg Oral Daily  . calcium carbonate  2 tablet Oral TID WC  . cefTAZidime (FORTAZ)  IV  2 g Intravenous Q T,Th,Sat-1800  . cinacalcet  30 mg Oral Daily  . cloNIDine  0.3 mg Transdermal Q Tue  . clopidogrel  75 mg Oral Q breakfast  . darbepoetin      . darbepoetin (ARANESP) injection - DIALYSIS  40 mcg Intravenous Q Tue-HD  . guaiFENesin  600 mg Oral BID  . labetalol  150 mg Oral BID  . multivitamin  1 tablet Oral QHS  . sodium chloride  10-40 mL Intracatheter Q12H    have reviewed scheduled and prn medications.  Physical Exam: General: alert, still having difficulty finding words Heart: RRR Lungs: sounds clearer Abdomen: soft, non tender Extremities: no edema Dialysis Access: L AVF, good thrill and bruit   I Assessment/ Plan:  Pt is a 51 y.o. yo male ESRD who was admitted on 08/10/2011 with  VATS/decortication of  R lung- now CVA Assessment/Plan: 1. S/p VATS/decortication of R lung for chronic pleural effusion - per VVS, CT removed  2. ESRD - normally TTS at [facility].  HD via L AVF.    Next will be due tomorrow to keep on TTS schedule.  No heparin with HD per neuro 3. Anemia- on aranesp , hgb 9.5 4. Secondary hyperparathyroidism- continue TUMs and sensipar and renavite.   No vitamin D listed, last phos was 7.9, will increase number of TUMS 5. HTN/volume- reasonable for situation.  Continue home BP meds (norvasc 10/clonidine/labetalol)  6. Likely new CVA- per primary team; Carotids are negative.  Cannot get MRI. Neuro and therapies now involved, on plavix. possible TEE? In future; clinically maybe a little bit better than yesterday.   Emmily Pellegrin A   08/15/2011,8:05 AM  LOS: 5 days

## 2011-08-15 NOTE — Evaluation (Signed)
Physical Therapy Evaluation Patient Details Name: Nathaniel White MRN: [id number] DOB: 1960/06/30 Today's Date: 08/15/2011 Time: [id number] PT Time Calculation (min): 27 min  PT Assessment / Plan / Recommendation Clinical Impression  pt admitted with recurrent pleural effusions, s/p VATS with minithoracotomy with decort. on R.  Pt's mobility limited by residual L sided hemipareisis and general weakness and decr. activity tolerance for recent inactivity.  Pt can benefit from OPPT.    PT Assessment  Patient needs continued PT services    Follow Up Recommendations  Outpatient PT    Barriers to Discharge        Equipment Recommendations  Defer to next venue    Recommendations for Other Services     Frequency Min 3X/week    Precautions / Restrictions Precautions Precautions: Fall   Pertinent Vitals/Pain       Mobility  Bed Mobility Bed Mobility: Not assessed Transfers Transfers: Sit to Stand;Stand to Sit Sit to Stand: 4: Min assist;From chair/3-in-1 Stand to Sit: 4: Min guard;To chair/3-in-1 Details for Transfer Assistance: used safe technique; min stability assist Ambulation/Gait Ambulation/Gait Assistance: 4: Min guard Ambulation Distance (Feet): 180 Feet Assistive device: None Ambulation/Gait Assistance Details: Hemiparetic gait L LE from previous stroke.   Gait Pattern: Step-through pattern;Decreased step length - right;Decreased stance time - left;Decreased stride length (stiff trunk) Stairs: No    Exercises     PT Diagnosis: Abnormality of gait;Generalized weakness (L hemiparesis)  PT Problem List: Decreased strength;Decreased activity tolerance;Decreased balance;Decreased coordination;Pain PT Treatment Interventions: Gait training;Stair training;Functional mobility training;Therapeutic activities;Balance training;Neuromuscular re-education;Patient/family education   PT Goals Acute Rehab PT Goals PT Goal Formulation: With patient Time For Goal Achievement:  08/22/11 Potential to Achieve Goals: Good Pt will go Supine/Side to Sit: with modified independence PT Goal: Supine/Side to Sit - Progress: Goal set today Pt will go Sit to Stand: with modified independence PT Goal: Sit to Stand - Progress: Goal set today Pt will Transfer Bed to Chair/Chair to Bed: with modified independence PT Transfer Goal: Bed to Chair/Chair to Bed - Progress: Goal set today Pt will Ambulate: >150 feet;with supervision PT Goal: Ambulate - Progress: Goal set today Pt will Go Up / Down Stairs: 1-2 stairs;with supervision;with rail(s) PT Goal: Up/Down Stairs - Progress: Goal set today  Visit Information  Last PT Received On: 08/15/11 Assistance Needed: +1    Subjective Data  Subjective: I've learned to be Right handed since my stroke Patient Stated Goal: home I   Prior [place] Lives With: Friend(s) Available Help at Discharge: Friend(s) Type of Home: House Home Access: Level entry Home Layout: Two level;Able to live on main level with bedroom/bathroom Alternate Level Stairs-Number of Steps: flight Alternate Level Stairs-Rails: Right;Left Bathroom Shower/Tub: Tub/shower unit;Curtain Bathroom Toilet: Standard Home Adaptive Equipment: None Prior Function Level of Independence: Independent Able to Take Stairs?: Yes Driving: Yes Communication Communication: Expressive difficulties Dominant Hand: Left    Cognition  Overall Cognitive Status: Appears within functional limits for tasks assessed/performed Arousal/Alertness: Awake/alert Orientation Level: Appears intact for tasks assessed Behavior During Session: [facility] for tasks performed    Extremity/Trunk Assessment Right Lower Extremity Assessment RLE ROM/Strength/Tone: [facility] for tasks assessed RLE Coordination: WFL - gross/fine motor Left Lower Extremity Assessment LLE ROM/Strength/Tone: Deficits LLE ROM/Strength/Tone Deficits: increased tone, decreased movement in isolation grossly 4/5 quads  3+/5 hip flexors pf and 2+/ 5 hams and df   Balance Balance Balance Assessed: Yes Static Sitting Balance Static Sitting - Balance Support: No upper extremity supported;Feet supported Static Sitting -  Level of Assistance: 7: Independent Static Standing Balance Static Standing - Balance Support: No upper extremity supported;During functional activity (scanning in static stance) Static Standing - Level of Assistance: 5: Stand by assistance  End of Session PT - End of Session Activity Tolerance: Patient tolerated treatment well Patient left: in chair;with call bell/phone within reach Nurse Communication: Mobility status  GP     Harryette Shuart, Tessie Fass 08/15/2011, 11:59 AM  08/15/2011  Donnella Sham, PT [phone] (pager)

## 2011-08-15 NOTE — Progress Notes (Signed)
Occupational Therapy Evaluation Patient Details Name: Nathaniel White MRN: [id number] DOB: 04/23/1960 Today's Date: 08/15/2011 Time: [id number] OT Time Calculation (min): 27 min  OT Assessment / Plan / Recommendation Clinical Impression  51 y.o. pt. admitted with recurrent pleural effusions, s/p VATS with minithoracotomy with decort. on R.  Pt's mobility limited by residual L sided hemipareisis and general weakness and decr. activity tolerance for recent inactivity.  Pt can benefit from OT to maximize indpendence and safety in ADLs prior to d/c.      OT Assessment  Patient needs continued OT Services    Follow Up Recommendations  Outpatient OT    Barriers to Discharge      Equipment Recommendations  Defer to next venue    Recommendations for Other Services    Frequency  Min 2X/week    Precautions / Restrictions Precautions Precautions: Fall Restrictions Weight Bearing Restrictions: No   Pertinent Vitals/Pain O2 sats dropped to 80's after ambulating in hallway on room air. Pt. Placed back on nasal cannula and O2 trended up to 90's.    ADL  Lower Body Bathing: Simulated;Set up Where Assessed - Lower Body Bathing: Supported sitting Lower Body Dressing: Performed;Set up Where Assessed - Lower Body Dressing: Supported sitting Toilet Transfer: Simulated;Minimal assistance Toilet Transfer Method: Sit to stand Toilet Transfer Equipment: Raised toilet seat with arms (or 3-in-1 over toilet) ADL Comments: Pt. able to don socks with Setup A while sitting in chair. He is at [place] for simulated ADLs. Pt. performed sit to stand transfer from chair with Min A. Pt. with decreased balance and activity tolerance. Pt. with Lt. hemiparesis from previous stroke.     OT Diagnosis: Generalized weakness;Acute pain  OT Problem List: Decreased strength;Decreased range of motion;Decreased activity tolerance;Impaired balance (sitting and/or standing);Pain OT Treatment Interventions: Self-care/ADL  training;DME and/or AE instruction;Therapeutic activities;Patient/family education;Balance training   OT Goals Acute Rehab OT Goals OT Goal Formulation: With patient Time For Goal Achievement: 08/29/11 Potential to Achieve Goals: Good ADL Goals Pt Will Perform Lower Body Bathing: with modified independence;Sit to stand from chair ADL Goal: Lower Body Bathing - Progress: Goal set today Pt Will Perform Lower Body Dressing: with modified independence;Sit to stand from bed;Sit to stand from chair ADL Goal: Lower Body Dressing - Progress: Goal set today Pt Will Transfer to Toilet: with modified independence;Ambulation;Regular height toilet ADL Goal: Toilet Transfer - Progress: Goal set today Pt Will Perform Toileting - Clothing Manipulation: with modified independence;Standing ADL Goal: Toileting - Clothing Manipulation - Progress: Goal set today Pt Will Perform Toileting - Hygiene: with modified independence;Sit to stand from 3-in-1/toilet ADL Goal: Toileting - Hygiene - Progress: Goal set today Pt Will Perform Tub/Shower Transfer: Tub transfer;Ambulation;with modified independence ADL Goal: Tub/Shower Transfer - Progress: Goal set today  Visit Information  Last OT Received On: 08/15/11 Assistance Needed: +1 PT/OT Co-Evaluation/Treatment: Yes    Subjective Data  Subjective: Pt. with expressive difficulties   Prior Functioning  Vision/Perception  Home Living Lives With: Friend(s) Available Help at Discharge: Friend(s) Type of Home: House Home Access: Level entry Home Layout: Two level;Able to live on main level with bedroom/bathroom Alternate Level Stairs-Number of Steps: flight Alternate Level Stairs-Rails: Right;Left Bathroom Shower/Tub: Tub/shower unit;Curtain Bathroom Toilet: Standard Home Adaptive Equipment: None Prior Function Level of Independence: Independent Able to Take Stairs?: Yes Driving: Yes Communication Communication: Expressive difficulties Dominant Hand:  Left      Cognition  Overall Cognitive Status: Appears within functional limits for tasks assessed/performed Arousal/Alertness: Awake/alert Orientation Level: Appears intact  for tasks assessed Behavior During Session: [facility] for tasks performed    Extremity/Trunk Assessment Right Upper Extremity Assessment RUE ROM/Strength/Tone: Within functional levels RUE Sensation: WFL - Light Touch Left Upper Extremity Assessment LUE ROM/Strength/Tone: Deficits LUE ROM/Strength/Tone Deficits: Pt. with tightness in elbow and terminal lag of apprx. 15 degrees Shoulder AROM approx 160 degrees. Unable to fully supinate. LUE Sensation: WFL - Light Touch Right Lower Extremity Assessment RLE ROM/Strength/Tone: WFL for tasks assessed RLE Coordination: WFL - gross/fine motor Left Lower Extremity Assessment LLE ROM/Strength/Tone: Deficits LLE ROM/Strength/Tone Deficits: increased tone, decreased movement in isolation grossly 4/5 quads 3+/5 hip flexors pf and 2+/ 5 hams and df   Mobility Bed Mobility Bed Mobility: Not assessed Transfers Transfers: Sit to Stand;Stand to Sit Sit to Stand: 4: Min assist;From chair/3-in-1 Stand to Sit: 4: Min guard;To chair/3-in-1 Details for Transfer Assistance: used safe technique; min stability assist         End of Session OT - End of Session Activity Tolerance: Patient tolerated treatment well Patient left: with call bell/phone within reach;in chair  GO     Ison Wichmann, Mendel Ryder 08/15/2011, 12:33 PM

## 2011-08-15 NOTE — Progress Notes (Signed)
D: Md ordered to remove posterior Chest tube prior to am portable chest xray. A: supplies gathered order verified by 2 RN's. R: D/C'd posterior chest tube per hospital policy, Pt tolerated well.

## 2011-08-15 NOTE — Progress Notes (Signed)
I agree with the following treatment note after reviewing documentation.   Johnston, Margueritte Guthridge Brynn   OTR/L Pager: [phone] Office: [phone] .   

## 2011-08-16 ENCOUNTER — Inpatient Hospital Stay (HOSPITAL_COMMUNITY): Payer: Medicare Other

## 2011-08-16 DIAGNOSIS — J90 Pleural effusion, not elsewhere classified: Secondary | ICD-10-CM

## 2011-08-16 LAB — RENAL FUNCTION PANEL
Albumin: 2.2 g/dL — ABNORMAL LOW (ref 3.5–5.2)
GFR calc Af Amer: 6 mL/min — ABNORMAL LOW (ref >90)
GFR calc non Af Amer: 5 mL/min — ABNORMAL LOW (ref >90)
Phosphorus: 4.9 mg/dL — ABNORMAL HIGH (ref 2.3–4.6)
Potassium: 4.1 mEq/L (ref 3.5–5.1)
Sodium: 135 mEq/L (ref 135–145)

## 2011-08-16 LAB — CBC
Hemoglobin: 9.5 g/dL — ABNORMAL LOW (ref 13.0–17.0)
MCHC: 32.2 g/dL (ref 30.0–36.0)
Platelets: 144 10*3/uL — ABNORMAL LOW (ref 150–400)
RDW: 18.1 % — ABNORMAL HIGH (ref 11.5–15.5)

## 2011-08-16 NOTE — Progress Notes (Signed)
PT Cancellation Note     Treatment cancelled today due to pt in HD this afternoon.  Will try back as able.  08/16/2011  Donnella Sham, [place] [phone] (pager)

## 2011-08-16 NOTE — Progress Notes (Signed)
Stroke Team Progress Note  HISTORY Nathaniel White is an 51 y.o. male who was admitted to [facility] hospital for VATS/decorication for recurrent pleural effusion on 08/10/11. Patient has known ESRD with now non-functioning kidney transplant '11 ([facility]) and on dialysis, HTN, anemia, CVA LV dysfunction most recent echo 07/23/11 showing cavity size was normal. Systolic function was severely reduced. The estimated ejection fraction was in the range of 25% to 30%. Diffuse/generalized hypokinesis of severe degree.   08/10/11 patient underwent VATS/decorication for recurrent pleural effusion. On 08/12/11 patient was noted to have confusion and difficulty. CT head was ordered on 08/13/11 which noted Wedge-shaped low attenuation area in the left parietal lobe with sulci effacement likely to represent acute infarct. Neurology was called for consult.   Patient was not a TPA candidate secondary to unknown time of onset and recent OR.  SUBJECTIVE Girlfriend in chair at bedside. Pt feels he is doing better.  OBJECTIVE Most recent Vital Signs: Filed Vitals:   08/15/11 1953 08/15/11 2351 08/16/11 0350 08/16/11 0750  BP: 137/85 120/76 129/80   Pulse: 86 87 81   Temp: 98.6 F (37 C) 98.4 F (36.9 C) 98.1 F (36.7 C) 98.2 F (36.8 C)  TempSrc: Oral Oral Oral Oral  Resp: 16 18 16    Height:      Weight:   74.5 kg (164 lb 3.9 oz)   SpO2: 92% 96% 91%    CBG (last 3) No results found for this basename: GLUCAP:3 in the last 72 hours Intake/Output from previous day: 08/07 0701 - 08/08 0700 In: 800 [P.O.:660; I.V.:140] Out: 45 [Chest Tube:45]  IV Fluid Intake:     . DISCONTD: dextrose 5 % and 0.9% NaCl 20 mL/hr at 08/15/11 1300   MEDICATIONS    . amLODipine  10 mg Oral QHS  . bisacodyl  10 mg Oral Daily  . calcium carbonate  3 tablet Oral TID WC  . cefTAZidime (FORTAZ)  IV  2 g Intravenous Q T,Th,Sat-1800  . cinacalcet  30 mg Oral Daily  . cloNIDine  0.3 mg Transdermal Q Tue  . clopidogrel  75 mg Oral Q breakfast    . darbepoetin (ARANESP) injection - DIALYSIS  40 mcg Intravenous Q Tue-HD  . guaiFENesin  600 mg Oral BID  . labetalol  150 mg Oral BID  . multivitamin  1 tablet Oral QHS  . sodium chloride  10-40 mL Intracatheter Q12H   PRN:  fentaNYL, heparin, ondansetron, potassium chloride, senna-docusate, sodium chloride, traMADol  Diet:  Renal thin liquids Activity:  ambulate DVT Prophylaxis:  SCDs   CLINICALLY SIGNIFICANT STUDIES Basic Metabolic Panel:  Lab [id number] 0906 08/13/11 0400  NA 132* 137  K 5.2* 4.3  CL 92* 95*  CO2 23 27  GLUCOSE 97 93  BUN 56* 41*  CREATININE 11.67* 9.38*  CALCIUM 8.5 8.6  MG -- --  PHOS 7.9* --   Liver Function Tests:  Lab 08/14/11 0906 08/12/11 0355  AST -- 16  ALT -- 8  ALKPHOS -- 59  BILITOT -- 0.3  PROT -- 6.8  ALBUMIN 2.3* 2.5*   CBC:  Lab 08/14/11 0906 08/13/11 0400  WBC 6.5 6.6  NEUTROABS -- --  HGB 9.5* 10.2*  HCT 29.2* 32.0*  MCV 87.7 87.4  PLT 139* 127*   Coagulation: No results found for this basename: LABPROT:4,INR:4 in the last 168 hours Lipid Panel    Component Value Date/Time   CHOL 102 08/14/2011 0400   TRIG 71 08/14/2011 0400   HDL 43  08/14/2011 0400   CHOLHDL 2.4 08/14/2011 0400   VLDL 14 08/14/2011 0400   LDLCALC 45 08/14/2011 0400   HgbA1C  Lab Results  Component Value Date   HGBA1C 5.4 08/13/2011   Urine Drug Screen:   No results found for this basename: labopia,  cocainscrnur,  labbenz,  amphetmu,  thcu,  labbarb    Alcohol Level: No results found for this basename: ETH:2 in the last 168 hours  CT of the brain  08/13/2011  Wedge-shaped low attenuation area in the left parietal lobe with sulci effacement likely to represent acute infarct.  No evidence of acute intracranial hemorrhage.   MRI/MRA of the brain  Unable to perform secondary to clips used during OR this admission  2D Echocardiogram  07/23/2011 EF 25%-30%. Diffuse/generalized hypokinesis of severe degree. Doppler parameters are consistent with a reversible  restrictive pattern, indicative of decreased left ventricular diastolic compliance and/or increased left atrial pressure (grade 3 diastolic dysfunction). Mitral valve: Moderately calcified annulus. Moderate to severe regurgitation directed centrally.  TEE  Carotid Doppler  No internal carotid artery stenosis bilaterally. Vertebrals with antegrade flow bilaterally.   CXR   08/14/2011 1.  Slight improvement in aeration in the left lower lobe, suggesting some resolving pneumonia or aspiration pneumonitis.  The radiographic appearance of the chest is otherwise essentially unchanged, as above.   08/13/2011   1.  Stable positioning of support apparatus.  No pneumothorax. 2.  Minimally improved aeration of the right mid and lower lung with persistent bilateral heterogeneous air space opacities, atelectasis versus infiltrate.   EKG  normal sinus rhythm, nonspecific ST and T waves changes, incomplete LBBB, LVH, LA enlargement, occasional PVC noted, unifocal (new).   Therapy Recommendations PT - OP; OT - OP; ST - OP  Physical Exam    The patient is alert and cooperative at the time of examination. Mild left facial droop is seen.  Extraocular movements are full, the patient has a mild relative right homonymous visual field deficit. The patient has a mild aphasia, difficulty with naming, and slight difficulty with repetition.  The patient has good strength of all 4 extremities, but mild spasticity is seen on the left hand and foot.  The patient has good finger-nose-finger and toe to finger bilaterally. Gait was not tested.  Deep tendon reflexes are symmetric, toes are neutral bilaterally.  ASSESSMENT Mr. Nathaniel White is a 51 y.o. male with an embolic left parietal lobe infarct s/p VATS/decortication R lung for chronic pleural effusion 08/10/2011. Infarct embolic secondary to low EF vs other cardioembolic source.  On aspirin 81 mg orally every day prior to admission. Now on clopidogrel 75 mg orally every  day for secondary stroke prevention. Patient with resultant expressive and receptive aphasia, improving. OP therapies recommended.  -hypertension -anemia -ESRD with kidney transplant w/ resultant rejection, back on HD 2012, TTS scheule -previous stroke, left hemiparesis face, arm and leg -LV dysfunction, EF 23-30% -MR, moderate to severe  Given the low EF by 2D echo, cardioembolic source of CVA is likely.  Hospital day # 6  TREATMENT/PLAN -Continue clopidogrel 75 mg orally every day for secondary stroke prevention. -avoid heparin during HD for 2 weeks -Will schedule TEE to look for cardioembolic source of stroke tomorrow. Please let me know if timing is not appropirate from surgical standpoint. -OP PT, OT and ST recommended  Burnetta Sabin, MSN, RN, ANVP-BC, ANP-BC, GNP-BC [facility] Pager: [phone] 08/16/2011 8:13 AM  Scribe for Dr. Floyde Parkins who has personally reviewed  chart, pertinent data, examined the patient and developed the plan of care.  Please refer to [email or web address] for on-call Stroke MD  Merrilee Seashore [facility]

## 2011-08-16 NOTE — Progress Notes (Signed)
Subjective:  Making good progress per speech therapy.  Echo showing cardiomyopathy with EF25-30%, suspecting cardiac emboli as source for CVA.  Still with one CT in place  Objective Vital signs in last 24 hours: Filed Vitals:   08/15/11 1600 08/15/11 1953 08/15/11 2351 08/16/11 0350  BP: 137/82 137/85 120/76 129/80  Pulse: 83 86 87 81  Temp: 98.5 F (36.9 C) 98.6 F (37 C) 98.4 F (36.9 C) 98.1 F (36.7 C)  TempSrc: Oral Oral Oral Oral  Resp: 27 16 18 16   Height:      Weight:    74.5 kg (164 lb 3.9 oz)  SpO2: 93% 92% 96% 91%   Weight change: -2.4 kg (-5 lb 4.7 oz)  Intake/Output Summary (Last 24 hours) at 08/16/11 0731 Last data filed at 08/16/11 0357  Gross per 24 hour  Intake    800 ml  Output     45 ml  Net    755 ml   Labs: Basic Metabolic Panel:  Lab [id number] 0906 08/13/11 0400 08/12/11 0355  NA 132* 137 130*  K 5.2* 4.3 5.4*  CL 92* 95* 90*  CO2 23 27 24   GLUCOSE 97 93 107*  BUN 56* 41* 60*  CREATININE 11.67* 9.38* 11.74*  CALCIUM 8.5 8.6 8.9  ALB -- -- --  PHOS 7.9* -- --   Liver Function Tests:  Lab 08/14/11 0906 08/12/11 0355  AST -- 16  ALT -- 8  ALKPHOS -- 59  BILITOT -- 0.3  PROT -- 6.8  ALBUMIN 2.3* 2.5*   No results found for this basename: LIPASE:3,AMYLASE:3 in the last 168 hours No results found for this basename: AMMONIA:3 in the last 168 hours CBC:  Lab 08/14/11 0906 08/13/11 0400 08/12/11 0355 08/11/11 0406  WBC 6.5 6.6 7.6 --  NEUTROABS -- -- -- --  HGB 9.5* 10.2* 10.6* --  HCT 29.2* 32.0* 34.3* --  MCV 87.7 87.4 88.9 89.2  PLT 139* 127* 160 --   Cardiac Enzymes: No results found for this basename: CKTOTAL:5,CKMB:5,CKMBINDEX:5,TROPONINI:5 in the last 168 hours CBG:  Lab 08/12/11 0723 08/12/11 0339 08/11/11 2357 08/11/11 2015 08/11/11 1550  GLUCAP 94 112* 124* 123* 111*    Iron Studies: No results found for this basename: IRON,TIBC,TRANSFERRIN,FERRITIN in the last 72 hours Studies/Results: Dg Chest Port 1 View  08/15/2011   *RADIOLOGY REPORT*  Clinical Data: Status post right decortication.  Chest tube.  PORTABLE CHEST - 1 VIEW  Comparison: Plain film chest 08/14/2011.  Findings: One of the patient's two right chest tubes has been removed.  There is some increased lucency in the periphery of the right lung base which may represent a small loculated pneumothorax. Pleural and parenchymal opacities in the right mid and lower lung zones are unchanged.  Left lung is clear.  There is cardiomegaly. Left IJ catheter is again noted.  IMPRESSION:  Status post removal of one of two right chest tubes.  There may be a small loculated right basilar pneumothorax.  No other change.  Original Report Authenticated By: Arvid Right. Luther Parody, M.D.   Medications: Infusions:    . DISCONTD: dextrose 5 % and 0.9% NaCl 20 mL/hr at 08/15/11 1300    Scheduled Medications:    . amLODipine  10 mg Oral QHS  . bisacodyl  10 mg Oral Daily  . calcium carbonate  3 tablet Oral TID WC  . cefTAZidime (FORTAZ)  IV  2 g Intravenous Q T,Th,Sat-1800  . cinacalcet  30 mg Oral Daily  . cloNIDine  0.3 mg Transdermal Q Tue  . clopidogrel  75 mg Oral Q breakfast  . darbepoetin (ARANESP) injection - DIALYSIS  40 mcg Intravenous Q Tue-HD  . guaiFENesin  600 mg Oral BID  . labetalol  150 mg Oral BID  . multivitamin  1 tablet Oral QHS  . sodium chloride  10-40 mL Intracatheter Q12H  . DISCONTD: calcium carbonate  2 tablet Oral TID WC    have reviewed scheduled and prn medications.  Physical Exam: General: alert, still having difficulty finding words but is better.  Very pleasant Heart: RRR Lungs: sounds clearer Abdomen: soft, non tender Extremities: no edema Dialysis Access: L AVF, good thrill and bruit   I Assessment/ Plan:  Pt is a 51 y.o. yo male ESRD who was admitted on 08/10/2011 with  VATS/decortication of  R lung- now CVA Assessment/Plan: 1. S/p VATS/decortication of R lung for chronic pleural effusion - per VVS, CT removed but one  remains 2. ESRD - normally TTS at AF.  HD via L AVF.    Next will be due today to keep on TTS schedule.  No heparin with HD per neuro 3. Anemia- on aranesp , hgb 9.5 4. Secondary hyperparathyroidism- continue TUMs and sensipar and renavite.   No vitamin D listed.  last phos was 7.9, will increase number of TUMS 5. HTN/volume- reasonable for situation.  Continue home BP meds (norvasc 10/clonidine/labetalol)  6. Likely new CVA- per primary team; Carotids are negative.  Cannot get MRI. Neuro and therapies now involved, on plavix. possible TEE? In future; clinically maybe a little bit better than yesterday.   Glee Lashomb A   08/16/2011,7:31 AM  LOS: 6 days

## 2011-08-16 NOTE — Progress Notes (Signed)
                    [address]            [place] [address]          [phone]     6 Days Post-Op Procedure(s) (LRB): VIDEO ASSISTED THORACOSCOPY (VATS)/DECORTICATION (Right)  Subjective: Feels well, no complaints.  Objective: Vital signs in last 24 hours: Patient Vitals for the past 24 hrs:  BP Temp Temp src Pulse Resp SpO2 Weight  08/16/11 0750 - 98.2 F (36.8 C) Oral - - - -  08/16/11 0350 129/80 mmHg 98.1 F (36.7 C) Oral 81  16  91 % 164 lb 3.9 oz (74.5 kg)  08/15/11 2351 120/76 mmHg 98.4 F (36.9 C) Oral 87  18  96 % -  08/15/11 1953 137/85 mmHg 98.6 F (37 C) Oral 86  16  92 % -  08/15/11 1600 137/82 mmHg 98.5 F (36.9 C) Oral 83  27  93 % -  08/15/11 1200 131/80 mmHg 97.8 F (36.6 C) Oral 81  15  100 % -   Current Weight  08/16/11 164 lb 3.9 oz (74.5 kg)     Intake/Output from previous day: 08/07 0701 - 08/08 0700 In: 800 [P.O.:660; I.V.:140] Out: 45 [Chest Tube:45]    PHYSICAL EXAM:  Heart: RRR Lungs: clear Wound: clean and dry Chest tube: no air leak    Lab Results: CBC: Basename 08/14/11 0906  WBC 6.5  HGB 9.5*  HCT 29.2*  PLT 139*   BMET:  Basename 08/14/11 0906  NA 132*  K 5.2*  CL 92*  CO2 23  GLUCOSE 97  BUN 56*  CREATININE 11.67*  CALCIUM 8.5    PT/INR: No results found for this basename: LABPROT,INR in the last 72 hours  CXR: IMPRESSION:  Unchanged lines and tubes with persistent positioning of the  peripheral central venous catheter tip perpendicular to the lateral  wall of the superior vena cava. This would need to be repositioned  to allow the tip to be directed parallel to the vessel wall.  Stable cardiopulmonary appearance with no evidence for residual  pneumothorax or change in the right lateral pleural density and  right lower lobe infiltrate/atelectasis.   Assessment/Plan: S/P Procedure(s) (LRB): VIDEO ASSISTED THORACOSCOPY (VATS)/DECORTICATION (Right)  CT output scant.  Hopefully can  d/c remaining CT soon.  CVA-stable, mild aphasia.  Continue Plavix per neuro.  TEE ordered for Friday to rule out cardiac embolic source.  PT/OT, reconditioning.  ESRD- per nephrology.   LOS: 6 days    Katilynn Sinkler H 08/16/2011

## 2011-08-16 NOTE — Progress Notes (Signed)
Pt to HD.  HD RN to reconnect CT to suction on hemodialysis unit.  Family in room.    Vista Lawman, RN

## 2011-08-16 NOTE — Progress Notes (Signed)
Utilization review completed.  

## 2011-08-17 ENCOUNTER — Encounter (HOSPITAL_COMMUNITY)
Admission: RE | Disposition: A | Discharge status: Home Health | Payer: Self-pay | Source: Ambulatory Visit | Attending: Cardiothoracic Surgery

## 2011-08-17 ENCOUNTER — Inpatient Hospital Stay (HOSPITAL_COMMUNITY): Payer: Medicare Other

## 2011-08-17 ENCOUNTER — Encounter (HOSPITAL_COMMUNITY): Payer: Self-pay | Admitting: *Deleted

## 2011-08-17 DIAGNOSIS — I6789 Other cerebrovascular disease: Secondary | ICD-10-CM

## 2011-08-17 HISTORY — PX: TEE WITHOUT CARDIOVERSION: SHX5443

## 2011-08-17 SURGERY — ECHOCARDIOGRAM, TRANSESOPHAGEAL
Anesthesia: Moderate Sedation

## 2011-08-17 MED ORDER — SODIUM CHLORIDE 0.45 % IV SOLN
INTRAVENOUS | Status: DC
  Administered 2011-08-17: 20 mL via INTRAVENOUS

## 2011-08-17 MED ORDER — FENTANYL CITRATE 0.05 MG/ML IJ SOLN
INTRAMUSCULAR | Status: AC
  Filled 2011-08-17: qty 2

## 2011-08-17 MED ORDER — MIDAZOLAM HCL 10 MG/2ML IJ SOLN
INTRAMUSCULAR | Status: DC | PRN
  Administered 2011-08-17 (×2): 1 mg via INTRAVENOUS
  Administered 2011-08-17 (×2): 2 mg via INTRAVENOUS

## 2011-08-17 MED ORDER — MIDAZOLAM HCL 10 MG/2ML IJ SOLN
INTRAMUSCULAR | Status: AC
  Filled 2011-08-17: qty 2

## 2011-08-17 MED ORDER — FENTANYL CITRATE 0.05 MG/ML IJ SOLN
INTRAMUSCULAR | Status: DC | PRN
  Administered 2011-08-17 (×2): 25 ug via INTRAVENOUS

## 2011-08-17 NOTE — Progress Notes (Signed)
Stroke Team Progress Note  HISTORY Nathaniel White is an 51 y.o. male who was admitted to [facility] hospital for VATS/decorication for recurrent pleural effusion on 08/10/11. Patient has known ESRD with now non-functioning kidney transplant '11 ([facility]) and on dialysis, HTN, anemia, CVA LV dysfunction most recent echo 07/23/11 showing cavity size was normal. Systolic function was severely reduced. The estimated ejection fraction was in the range of 25% to 30%. Diffuse/generalized hypokinesis of severe degree.   08/10/11 patient underwent VATS/decorication for recurrent pleural effusion. On 08/12/11 patient was noted to have confusion and difficulty. CT head was ordered on 08/13/11 which noted Wedge-shaped low attenuation area in the left parietal lobe with sulci effacement likely to represent acute infarct. Neurology was called for consult.   Patient was not a TPA candidate secondary to unknown time of onset and recent OR.  SUBJECTIVE Girlfriend at bedside. Pt NPO for testing this am. No new complaints.  OBJECTIVE Most recent Vital Signs: Filed Vitals:   08/16/11 2110 08/16/11 2255 08/17/11 0000 08/17/11 0358  BP:  120/77  140/86  Pulse: 96 91  82  Temp:   98.8 F (37.1 C) 98.5 F (36.9 C)  TempSrc:   Oral Oral  Resp: 18 22  16   Height:      Weight:      SpO2: 93% 93%  92%   Intake/Output from previous day: 08/08 0701 - 08/09 0700 In: 530 [P.O.:480; IV Piggyback:50] Out: 3007 [Urine:20]  IV Fluid Intake:    MEDICATIONS    . amLODipine  10 mg Oral QHS  . bisacodyl  10 mg Oral Daily  . calcium carbonate  3 tablet Oral TID WC  . cefTAZidime (FORTAZ)  IV  2 g Intravenous Q T,Th,Sat-1800  . cinacalcet  30 mg Oral Daily  . cloNIDine  0.3 mg Transdermal Q Tue  . clopidogrel  75 mg Oral Q breakfast  . darbepoetin (ARANESP) injection - DIALYSIS  40 mcg Intravenous Q Tue-HD  . guaiFENesin  600 mg Oral BID  . labetalol  150 mg Oral BID  . multivitamin  1 tablet Oral QHS  . sodium chloride  10-40 mL  Intracatheter Q12H   PRN:  fentaNYL, heparin, ondansetron, potassium chloride, senna-docusate, sodium chloride, traMADol  Diet:  NPO  Activity:  ambulate DVT Prophylaxis:  SCDs   CLINICALLY SIGNIFICANT STUDIES Basic Metabolic Panel:  Lab [id number] 1036 08/14/11 0906  NA 135 132*  K 4.1 5.2*  CL 95* 92*  CO2 24 23  GLUCOSE 113* 97  BUN 53* 56*  CREATININE 10.36* 11.67*  CALCIUM 9.0 8.5  MG -- --  PHOS 4.9* 7.9*   Liver Function Tests:  Lab 08/16/11 1036 08/14/11 0906 08/12/11 0355  AST -- -- 16  ALT -- -- 8  ALKPHOS -- -- 59  BILITOT -- -- 0.3  PROT -- -- 6.8  ALBUMIN 2.2* 2.3* --   CBC:  Lab 08/16/11 1035 08/14/11 0906  WBC 6.1 6.5  NEUTROABS -- --  HGB 9.5* 9.5*  HCT 29.5* 29.2*  MCV 87.0 87.7  PLT 144* 139*   Lipid Panel    Component Value Date/Time   CHOL 102 08/14/2011 0400   TRIG 71 08/14/2011 0400   HDL 43 08/14/2011 0400   CHOLHDL 2.4 08/14/2011 0400   VLDL 14 08/14/2011 0400   LDLCALC 45 08/14/2011 0400   HgbA1C  Lab Results  Component Value Date   HGBA1C 5.4 08/13/2011   Urine Drug Screen:   No results found for this basename:  labopia,  cocainscrnur,  labbenz,  amphetmu,  thcu,  labbarb    Alcohol Level: No results found for this basename: ETH:2 in the last 168 hours  CT of the brain  08/13/2011  Wedge-shaped low attenuation area in the left parietal lobe with sulci effacement likely to represent acute infarct.  No evidence of acute intracranial hemorrhage.   MRI/MRA of the brain  Unable to perform secondary to clips used during OR this admission  2D Echocardiogram  07/23/2011 EF 25%-30%. Diffuse/generalized hypokinesis of severe degree. Doppler parameters are consistent with a reversible restrictive pattern, indicative of decreased left ventricular diastolic compliance and/or increased left atrial pressure (grade 3 diastolic dysfunction). Mitral valve: Moderately calcified annulus. Moderate to severe regurgitation directed centrally.  TEE 08/17/2011  Carotid  Doppler  No internal carotid artery stenosis bilaterally. Vertebrals with antegrade flow bilaterally.   CXR   08/14/2011 1.  Slight improvement in aeration in the left lower lobe, suggesting some resolving pneumonia or aspiration pneumonitis.  The radiographic appearance of the chest is otherwise essentially unchanged, as above.   08/13/2011   1.  Stable positioning of support apparatus.  No pneumothorax. 2.  Minimally improved aeration of the right mid and lower lung with persistent bilateral heterogeneous air space opacities, atelectasis versus infiltrate.   EKG  normal sinus rhythm, nonspecific ST and T waves changes, incomplete LBBB, LVH, LA enlargement, occasional PVC noted, unifocal (new).   Therapy Recommendations PT - OP; OT - OP; ST - OP  Physical Exam    The patient is alert and cooperative at the time of examination. Mild left facial droop is seen.  Extraocular movements are full, the patient has a mild relative right homonymous visual field deficit. The patient has a mild aphasia, difficulty with naming, and slight difficulty with repetition.  The patient has good strength of all 4 extremities, but mild spasticity is seen on the left hand and foot.  The patient has good finger-nose-finger and toe to finger bilaterally. Gait was not tested.  Deep tendon reflexes are symmetric, toes are neutral bilaterally.  ASSESSMENT Mr. Nathaniel White is a 51 y.o. male with an embolic left parietal lobe infarct s/p VATS/decortication R lung for chronic pleural effusion 08/10/2011. Infarct embolic secondary to low EF vs other cardioembolic source.  On aspirin 81 mg orally every day prior to admission. Now on clopidogrel 75 mg orally every day for secondary stroke prevention. Patient with resultant expressive and receptive aphasia, continuing to improve. OP therapies recommended.  -hypertension -anemia -ESRD with kidney transplant w/ resultant rejection, back on HD 2012, TTS scheule -previous  stroke, left hemiparesis face, arm and leg -LV dysfunction, EF 23-30% -MR, moderate to severe  Given the low EF by 2D echo, cardioembolic source of CVA is likely.  Hospital day # 7  TREATMENT/PLAN -Continue clopidogrel 75 mg orally every day for secondary stroke prevention. -avoid heparin during HD for 2 weeks -TEE today to look for cardioembolic source of stroke.  -OP PT, OT and ST recommended  Burnetta Sabin, MSN, RN, ANVP-BC, ANP-BC, GNP-BC [facility] Pager: [phone] 08/17/2011 7:52 AM  Scribe for Dr. Floyde Parkins who has personally reviewed chart, pertinent data, examined the patient and developed the plan of care.  Please refer to [email or web address] for on-call Stroke MD  Lenor Coffin

## 2011-08-17 NOTE — Progress Notes (Signed)
Report called to [employer] on unit 2000.  Pt to be transfered

## 2011-08-17 NOTE — Progress Notes (Signed)
Physical Therapy Treatment Patient Details Name: Nathaniel White MRN: [id number] DOB: Oct 18, 1960 Today's Date: 08/17/2011 Time: [id number] PT Time Calculation (min): 15 min  PT Assessment / Plan / Recommendation Comments on Treatment Session  Pt is like well on his way to getting back toward baseline functioning with a stable L hemiparetic gait .    Follow Up Recommendations  Outpatient PT;Other (comment) (may end up not needing OPPT follow up by discharge as he mig)    Barriers to Discharge        Equipment Recommendations  Defer to next venue    Recommendations for Other Services    Frequency Min 3X/week   Plan Discharge plan needs to be updated    Precautions / Restrictions Precautions Precautions: Fall Restrictions Weight Bearing Restrictions: No   Pertinent Vitals/Pain     Mobility  Bed Mobility Bed Mobility: Supine to Sit;Sitting - Scoot to Edge of Bed Supine to Sit: 7: Independent Sitting - Scoot to [employer] of Bed: 7: Independent Transfers Transfers: Sit to Stand;Stand to Sit Sit to Stand: 5: Supervision Stand to Sit: 5: Supervision Details for Transfer Assistance: used safe method to get up Ambulation/Gait Ambulation/Gait Assistance: 5: Supervision Ambulation Distance (Feet): 330 Feet Assistive device: None Ambulation/Gait Assistance Details: stable hemiparetic gait on L Gait Pattern: Step-through pattern Stairs: Yes Stairs Assistance: 5: Supervision Stairs Assistance Details (indicate cue type and reason): safe technique Stair Management Technique: One rail Right;Alternating pattern;Step to pattern;Forwards Number of Stairs: 5     Exercises     PT Diagnosis:    PT Problem List:   PT Treatment Interventions:     PT Goals Acute Rehab PT Goals PT Goal Formulation: With patient Potential to Achieve Goals: Good PT Goal: Supine/Side to Sit - Progress: Met PT Goal: Sit to Stand - Progress: Met PT Transfer Goal: Bed to Chair/Chair to Bed - Progress: Met PT  Goal: Ambulate - Progress: Met PT Goal: Up/Down Stairs - Progress: Met  Visit Information  Last PT Received On: 08/17/11 Assistance Needed: +1    Subjective Data  Subjective: If we had my shoes on I would do even bertter   Cognition  Overall Cognitive Status: Appears within functional limits for tasks assessed/performed Arousal/Alertness: Awake/alert Orientation Level: Appears intact for tasks assessed Behavior During Session: [facility] for tasks performed    Balance  Balance Balance Assessed: Yes Static Sitting Balance Static Sitting - Level of Assistance: 7: Independent Static Standing Balance Static Standing - Balance Support: No upper extremity supported;During functional activity Static Standing - Level of Assistance: 7: Independent  End of Session PT - End of Session Activity Tolerance: Patient tolerated treatment well Patient left: with call bell/phone within reach;in bed;with family/visitor present Nurse Communication: Mobility status   GP     Shawanna Zanders, Tessie Fass 08/17/2011, 10:06 AM  08/17/2011  Donnella Sham, PT [phone] (pager)

## 2011-08-17 NOTE — Progress Notes (Signed)
Subjective:   Speech improving, sitting on side of bed, no current complaints, but NPO for pending TEE.  TEE was negative for cardiac embolic source.  Final CT removed, going to be moving to 2000  Objective: Vital signs in last 24 hours: Temp:  [97.4 F (36.3 C)-98.8 F (37.1 C)] 98.5 F (36.9 C) (08/09 0358) Pulse Rate:  [81-100] 82  (08/09 0358) Resp:  [14-28] 16  (08/09 0358) BP: (120-163)/(72-94) 140/86 mmHg (08/09 0358) SpO2:  [91 %-93 %] 92 % (08/09 0358) Weight:  [68.8 kg (151 lb 10.8 oz)-72.8 kg (160 lb 7.9 oz)] 68.8 kg (151 lb 10.8 oz) (08/08 1445) Weight change: -1.7 kg (-3 lb 12 oz)  Intake/Output from previous day: 08/08 0701 - 08/09 0700 In: 530 [P.O.:480; IV Piggyback:50] Out: 3007 [Urine:20]   EXAM: General appearance: Alert, comfortable, some difficulty with finding words Resp:  CTA without rales, rhonchi, or wheezes Cardio:  RRR without murmur GI: + BS, soft and nontender Extremities:  No edema Access:  AVF @ LUA with + bruit  Lab Results:  Basename 08/16/11 1035  WBC 6.1  HGB 9.5*  HCT 29.5*  PLT 144*   BMET:  Basename 08/16/11 1036  NA 135  K 4.1  CL 95*  CO2 24  GLUCOSE 113*  BUN 53*  CREATININE 10.36*  CALCIUM 9.0  ALBUMIN 2.2*   No results found for this basename: PTH:2 in the last 72 hours Iron Studies: No results found for this basename: IRON,TIBC,TRANSFERRIN,FERRITIN in the last 72 hours  Assessment/Plan: 1. S/p VATS/decortication of R lung for chronic pleural effusion - on 8/3 per Dr Prescott Gum, CTs removed 2. ESRD - normally HD via AVF on TTS at AF; K 4.1 yesterday. Next HD tomorrow with no heparin per neuro. Will do first shift because I anticipate patient is approaching discharge 3. Anemia - on aranesp 40 mcg on Tues, hgb 9.5.  4. Secondary hyperparathyroidism - continue TUMs (increased yesterday), sensipar and renavite, no vitamin D listed; P down to 4.9, Ca 9 (10.4 corrected).  5. HTN/volume - reasonable for situation (140/86);  post HD wt 68.8 kg s/p UF 3 L on 8/7. Continue home BP meds (norvasc 10/clonidine/labetalol), UF goal of 2 L tomorrow.  6. Likely new CVA - acute embolic left parietal lobe infarct s/p VATS/decortication of R lung on 8/3; expressive aphasia improving; neuro and therapies now involved, on plavix. TEE today to look for cardioembolic source- was neg.   LOS: 7 days   LYLES,CHARLES 08/17/2011,9:57 AM  Patient seen and examined, agree with above note with above modifications. Hospitalization seems to be winding down.   Plan for HD in AM Corliss Parish, MD 08/17/2011

## 2011-08-17 NOTE — Progress Notes (Addendum)
Day of Surgery Procedure(s) (LRB): TRANSESOPHAGEAL ECHOCARDIOGRAM (TEE) (N/A) Subjective:  No new complaints.  Underwent TEE today.  Objective: Vital signs in last 24 hours: Temp:  [97.4 F (36.3 C)-98.8 F (37.1 C)] 97.5 F (36.4 C) (08/09 1055) Pulse Rate:  [81-100] 95  (08/09 1055) Cardiac Rhythm:  [-] Normal sinus rhythm (08/08 2255) Resp:  [6-28] 17  (08/09 1200) BP: (117-167)/(71-113) 146/95 mmHg (08/09 1200) SpO2:  [91 %-100 %] 96 % (08/09 1200) Weight:  [151 lb 10.8 oz (68.8 kg)] 151 lb 10.8 oz (68.8 kg) (08/08 1445)  Intake/Output from previous day: 08/08 0701 - 08/09 0700 In: 530 [P.O.:480; IV Piggyback:50] Out: 3007 [Urine:20]  General appearance: alert, cooperative and no distress Heart: regular rate and rhythm Lungs: clear to auscultation bilaterally Abdomen: soft, non-tender; bowel sounds normal; no masses,  no organomegaly Extremities: edema trace Wound: clean and dry  Lab Results:  Basename 08/16/11 1035  WBC 6.1  HGB 9.5*  HCT 29.5*  PLT 144*   BMET:  Basename 08/16/11 1036  NA 135  K 4.1  CL 95*  CO2 24  GLUCOSE 113*  BUN 53*  CREATININE 10.36*  CALCIUM 9.0    PT/INR: No results found for this basename: LABPROT,INR in the last 72 hours ABG    Component Value Date/Time   PHART 7.394 08/11/2011 0401   HCO3 27.6* 08/11/2011 0401   TCO2 29 08/11/2011 0401   O2SAT 94.0 08/11/2011 0401   CBG (last 3)  No results found for this basename: GLUCAP:3 in the last 72 hours  Assessment/Plan: S/P Procedure(s) (LRB): TRANSESOPHAGEAL ECHOCARDIOGRAM (TEE) (N/A)  1. CT output- minimal drainage present, no air leak appreciated 2. CVA- stable, neurology following, TEE done today no cardiac source of embolism identified 3. ESRD-nephrology following 4. Deconditioning- cont PT/OT 5. Dispo- can hopefully d/c chest tube soon, patient stable   LOS: 7 days    Nathaniel White 08/17/2011   patient examined and medical record reviewed,agree with above note. DC  chest tube and tx to 2000 Needs rehab eval for postop CVA - if not accepted needs Home PT at [place] 08/17/2011

## 2011-08-17 NOTE — CV Procedure (Signed)
Procedure: TEE  Indication: CVA  Sedation: Versed 5 mg IV, Fentanyl 50 mcg IV  Findings: Please see echo section for full report.  Normal LV size with moderate global hypokinesis, EF 30-35%.  MR appears to be moderate with calcified and somewhat fixed posterior MV leaflet.  No LAA thrombus.  No LV thrombus seen but apex not fully visualized on this TEE.  Negative bubble study.   No source of embolism.   No complications.   Loralie Champagne 08/17/2011 11:53 AM

## 2011-08-17 NOTE — Interval H&P Note (Signed)
History and Physical Interval Note:  08/17/2011 11:06 AM  Nathaniel White  has presented today for surgery, with the diagnosis of stroke  The various methods of treatment have been discussed with the patient and family. After consideration of risks, benefits and other options for treatment, the patient has consented to  Procedure(s) (LRB): TRANSESOPHAGEAL ECHOCARDIOGRAM (TEE) (N/A) as a surgical intervention .  The patient's history has been reviewed, patient examined, no change in status, stable for surgery.  I have reviewed the patient's chart and labs.  Questions were answered to the patient's satisfaction.     Tahir Blank [employer]

## 2011-08-17 NOTE — Progress Notes (Signed)
CT removed. Pt tolerated without event.  Site unremarkable. Pt without complaints. To be transferred to 2000

## 2011-08-17 NOTE — Progress Notes (Signed)
  Echocardiogram Echocardiogram Transesophageal has been performed.  Nathaniel White 08/17/2011, 12:10 PM

## 2011-08-17 NOTE — H&P (View-Only) (Signed)
Stroke Team Progress Note  HISTORY Nathaniel White is an 51 y.o. male who was admitted to [facility] hospital for VATS/decorication for recurrent pleural effusion on 08/10/11. Patient has known ESRD with now non-functioning kidney transplant '11 ([facility]) and on dialysis, HTN, anemia, CVA LV dysfunction most recent echo 07/23/11 showing cavity size was normal. Systolic function was severely reduced. The estimated ejection fraction was in the range of 25% to 30%. Diffuse/generalized hypokinesis of severe degree.   08/10/11 patient underwent VATS/decorication for recurrent pleural effusion. On 08/12/11 patient was noted to have confusion and difficulty. CT head was ordered on 08/13/11 which noted Wedge-shaped low attenuation area in the left parietal lobe with sulci effacement likely to represent acute infarct. Neurology was called for consult.   Patient was not a TPA candidate secondary to unknown time of onset and recent OR.  SUBJECTIVE Nathaniel White at bedside. Pt NPO for testing this am. No new complaints.  OBJECTIVE Most recent Vital Signs: Filed Vitals:   08/16/11 2110 08/16/11 2255 08/17/11 0000 08/17/11 0358  BP:  120/77  140/86  Pulse: 96 91  82  Temp:   98.8 F (37.1 C) 98.5 F (36.9 C)  TempSrc:   Oral Oral  Resp: 18 22  16   Height:      Weight:      SpO2: 93% 93%  92%   Intake/Output from previous day: 08/08 0701 - 08/09 0700 In: 530 [P.O.:480; IV Piggyback:50] Out: 3007 [Urine:20]  IV Fluid Intake:    MEDICATIONS    . amLODipine  10 mg Oral QHS  . bisacodyl  10 mg Oral Daily  . calcium carbonate  3 tablet Oral TID WC  . cefTAZidime (FORTAZ)  IV  2 g Intravenous Q T,Th,Sat-1800  . cinacalcet  30 mg Oral Daily  . cloNIDine  0.3 mg Transdermal Q Tue  . clopidogrel  75 mg Oral Q breakfast  . darbepoetin (ARANESP) injection - DIALYSIS  40 mcg Intravenous Q Tue-HD  . guaiFENesin  600 mg Oral BID  . labetalol  150 mg Oral BID  . multivitamin  1 tablet Oral QHS  . sodium chloride  10-40 mL  Intracatheter Q12H   PRN:  fentaNYL, heparin, ondansetron, potassium chloride, senna-docusate, sodium chloride, traMADol  Diet:  NPO  Activity:  ambulate DVT Prophylaxis:  SCDs   CLINICALLY SIGNIFICANT STUDIES Basic Metabolic Panel:  Lab Q000111Q 1036 08/14/11 0906  NA 135 132*  K 4.1 5.2*  CL 95* 92*  CO2 24 23  GLUCOSE 113* 97  BUN 53* 56*  CREATININE 10.36* 11.67*  CALCIUM 9.0 8.5  MG -- --  PHOS 4.9* 7.9*   Liver Function Tests:  Lab 08/16/11 1036 08/14/11 0906 08/12/11 0355  AST -- -- 16  ALT -- -- 8  ALKPHOS -- -- 59  BILITOT -- -- 0.3  PROT -- -- 6.8  ALBUMIN 2.2* 2.3* --   CBC:  Lab 08/16/11 1035 08/14/11 0906  WBC 6.1 6.5  NEUTROABS -- --  HGB 9.5* 9.5*  HCT 29.5* 29.2*  MCV 87.0 87.7  PLT 144* 139*   Lipid Panel    Component Value Date/Time   CHOL 102 08/14/2011 0400   TRIG 71 08/14/2011 0400   HDL 43 08/14/2011 0400   CHOLHDL 2.4 08/14/2011 0400   VLDL 14 08/14/2011 0400   LDLCALC 45 08/14/2011 0400   HgbA1C  Lab Results  Component Value Date   HGBA1C 5.4 08/13/2011   Urine Drug Screen:   No results found for this basename:  labopia,  cocainscrnur,  labbenz,  amphetmu,  thcu,  labbarb    Alcohol Level: No results found for this basename: ETH:2 in the last 168 hours  CT of the brain  08/13/2011  Wedge-shaped low attenuation area in the left parietal lobe with sulci effacement likely to represent acute infarct.  No evidence of acute intracranial hemorrhage.   MRI/MRA of the brain  Unable to perform secondary to clips used during OR this admission  2D Echocardiogram  07/23/2011 EF 25%-30%. Diffuse/generalized hypokinesis of severe degree. Doppler parameters are consistent with a reversible restrictive pattern, indicative of decreased left ventricular diastolic compliance and/or increased left atrial pressure (grade 3 diastolic dysfunction). Mitral valve: Moderately calcified annulus. Moderate to severe regurgitation directed centrally.  TEE 08/17/2011  Carotid  Doppler  No internal carotid artery stenosis bilaterally. Vertebrals with antegrade flow bilaterally.   CXR   08/14/2011 1.  Slight improvement in aeration in the left lower lobe, suggesting some resolving pneumonia or aspiration pneumonitis.  The radiographic appearance of the chest is otherwise essentially unchanged, as above.   08/13/2011   1.  Stable positioning of support apparatus.  No pneumothorax. 2.  Minimally improved aeration of the right mid and lower lung with persistent bilateral heterogeneous air space opacities, atelectasis versus infiltrate.   EKG  normal sinus rhythm, nonspecific ST and T waves changes, incomplete LBBB, LVH, LA enlargement, occasional PVC noted, unifocal (new).   Therapy Recommendations PT - OP; OT - OP; ST - OP  Physical Exam    The patient is alert and cooperative at the time of examination. Mild left facial droop is seen.  Extraocular movements are full, the patient has a mild relative right homonymous visual field deficit. The patient has a mild aphasia, difficulty with naming, and slight difficulty with repetition.  The patient has good strength of all 4 extremities, but mild spasticity is seen on the left hand and foot.  The patient has good finger-nose-finger and toe to finger bilaterally. Gait was not tested.  Deep tendon reflexes are symmetric, toes are neutral bilaterally.  ASSESSMENT Nathaniel White is a 51 y.o. male with an embolic left parietal lobe infarct s/p VATS/decortication R lung for chronic pleural effusion 08/10/2011. Infarct embolic secondary to low EF vs other cardioembolic source.  On aspirin 81 mg orally every day prior to admission. Now on clopidogrel 75 mg orally every day for secondary stroke prevention. Patient with resultant expressive and receptive aphasia, continuing to improve. OP therapies recommended.  -hypertension -anemia -ESRD with kidney transplant w/ resultant rejection, back on HD 2012, TTS scheule -previous  stroke, left hemiparesis face, arm and leg -LV dysfunction, EF 23-30% -MR, moderate to severe  Given the low EF by 2D echo, cardioembolic source of CVA is likely.  Hospital day # 7  TREATMENT/PLAN -Continue clopidogrel 75 mg orally every day for secondary stroke prevention. -avoid heparin during HD for 2 weeks -TEE today to look for cardioembolic source of stroke.  -OP PT, OT and ST recommended  Burnetta Sabin, MSN, RN, ANVP-BC, ANP-BC, GNP-BC [facility] Pager: [phone] 08/17/2011 7:52 AM  Scribe for Dr. Floyde Parkins who has personally reviewed chart, pertinent data, examined the patient and developed the plan of care.  Please refer to [email or web address] for on-call Stroke MD  Lenor Coffin

## 2011-08-17 NOTE — Progress Notes (Signed)
Pt to Endo for TEE. No complaints.

## 2011-08-18 ENCOUNTER — Inpatient Hospital Stay (HOSPITAL_COMMUNITY): Payer: Medicare Other

## 2011-08-18 LAB — CBC
HCT: 33.3 % — ABNORMAL LOW (ref 39.0–52.0)
Hemoglobin: 10.3 g/dL — ABNORMAL LOW (ref 13.0–17.0)
MCHC: 30.9 g/dL (ref 30.0–36.0)
RDW: 18.3 % — ABNORMAL HIGH (ref 11.5–15.5)
WBC: 6.5 10*3/uL (ref 4.0–10.5)

## 2011-08-18 LAB — RENAL FUNCTION PANEL
Albumin: 2.4 g/dL — ABNORMAL LOW (ref 3.5–5.2)
BUN: 49 mg/dL — ABNORMAL HIGH (ref 6–23)
Chloride: 97 mEq/L (ref 96–112)
GFR calc Af Amer: 7 mL/min — ABNORMAL LOW (ref >90)
Glucose, Bld: 109 mg/dL — ABNORMAL HIGH (ref 70–99)
Phosphorus: 4.4 mg/dL (ref 2.3–4.6)
Potassium: 4.4 mEq/L (ref 3.5–5.1)
Sodium: 138 mEq/L (ref 135–145)

## 2011-08-18 NOTE — Progress Notes (Signed)
Pt to HD this AM; family in room.

## 2011-08-18 NOTE — Progress Notes (Signed)
Stroke Team Progress Note  HISTORY LAYLA EVARISTO is an 51 y.o. male who was admitted to [facility] hospital for VATS/decorication for recurrent pleural effusion on 08/10/11. Patient has known ESRD with now non-functioning kidney transplant '11 ([facility]) and on dialysis, HTN, anemia, CVA LV dysfunction most recent echo 07/23/11 showing cavity size was normal. Systolic function was severely reduced. The estimated ejection fraction was in the range of 25% to 30%. Diffuse/generalized hypokinesis of severe degree.   08/10/11 patient underwent VATS/decorication for recurrent pleural effusion. On 08/12/11 patient was noted to have confusion and difficulty. CT head was ordered on 08/13/11 which noted Wedge-shaped low attenuation area in the left parietal lobe with sulci effacement likely to represent acute infarct. Neurology was called for consult.   Patient was not a TPA candidate secondary to unknown time of onset and recent OR.  SUBJECTIVE The patient is in hemodialysis. He notes no new symptoms.  OBJECTIVE Most recent Vital Signs: Filed Vitals:   08/18/11 0412 08/18/11 0800 08/18/11 0805 08/18/11 0835  BP: 145/77 142/86 134/85 126/76  Pulse: 83 86 86 89  Temp: 98.2 F (36.8 C) 97.1 F (36.2 C)    TempSrc: Oral Oral    Resp: 19 21 18 16   Height:      Weight: 71.124 kg (156 lb 12.8 oz) 71.1 kg (156 lb 12 oz)    SpO2: 95% 96%     CBG (last 3) No results found for this basename: GLUCAP:3 in the last 72 hours Intake/Output from previous day: 08/09 0701 - 08/10 0700 In: 720 [P.O.:720] Out: 0   IV Fluid Intake:      . DISCONTD: sodium chloride 20 mL (08/17/11 1324)   MEDICATIONS     . amLODipine  10 mg Oral QHS  . bisacodyl  10 mg Oral Daily  . calcium carbonate  3 tablet Oral TID WC  . cefTAZidime (FORTAZ)  IV  2 g Intravenous Q T,Th,Sat-1800  . cinacalcet  30 mg Oral Daily  . cloNIDine  0.3 mg Transdermal Q Tue  . clopidogrel  75 mg Oral Q breakfast  . darbepoetin (ARANESP) injection - DIALYSIS   40 mcg Intravenous Q Tue-HD  . guaiFENesin  600 mg Oral BID  . labetalol  150 mg Oral BID  . multivitamin  1 tablet Oral QHS  . sodium chloride  10-40 mL Intracatheter Q12H   PRN:  fentaNYL, heparin, ondansetron, potassium chloride, senna-docusate, sodium chloride, traMADol, DISCONTD: fentaNYL, DISCONTD: midazolam  Diet:  Renal thin liquids Activity:  ambulate DVT Prophylaxis:  SCDs   CLINICALLY SIGNIFICANT STUDIES Basic Metabolic Panel:   Lab Q000111Q 1036 08/14/11 0906  NA 135 132*  K 4.1 5.2*  CL 95* 92*  CO2 24 23  GLUCOSE 113* 97  BUN 53* 56*  CREATININE 10.36* 11.67*  CALCIUM 9.0 8.5  MG -- --  PHOS 4.9* 7.9*   Liver Function Tests:   Lab 08/16/11 1036 08/14/11 0906 08/12/11 0355  AST -- -- 16  ALT -- -- 8  ALKPHOS -- -- 59  BILITOT -- -- 0.3  PROT -- -- 6.8  ALBUMIN 2.2* 2.3* --   CBC:   Lab 08/18/11 0805 08/16/11 1035  WBC 6.5 6.1  NEUTROABS -- --  HGB 10.3* 9.5*  HCT 33.3* 29.5*  MCV 89.3 87.0  PLT 171 144*   Coagulation: No results found for this basename: LABPROT:4,INR:4 in the last 168 hours Lipid Panel    Component Value Date/Time   CHOL 102 08/14/2011 0400   TRIG  71 08/14/2011 0400   HDL 43 08/14/2011 0400   CHOLHDL 2.4 08/14/2011 0400   VLDL 14 08/14/2011 0400   LDLCALC 45 08/14/2011 0400   HgbA1C  Lab Results  Component Value Date   HGBA1C 5.4 08/13/2011   Urine Drug Screen:   No results found for this basename: labopia,  cocainscrnur,  labbenz,  amphetmu,  thcu,  labbarb    Alcohol Level: No results found for this basename: ETH:2 in the last 168 hours  CT of the brain  08/13/2011  Wedge-shaped low attenuation area in the left parietal lobe with sulci effacement likely to represent acute infarct.  No evidence of acute intracranial hemorrhage.   MRI/MRA of the brain  Unable to perform secondary to clips used during OR this admission  2D Echocardiogram  07/23/2011 EF 25%-30%. Diffuse/generalized hypokinesis of severe degree. Doppler parameters are  consistent with a reversible restrictive pattern, indicative of decreased left ventricular diastolic compliance and/or increased left atrial pressure (grade 3 diastolic dysfunction). Mitral valve: Moderately calcified annulus. Moderate to severe regurgitation directed centrally.  TEE Findings: Please see echo section for full report. Normal LV size with moderate global hypokinesis, EF 30-35%. MR appears to be moderate with calcified and somewhat fixed posterior MV leaflet. No LAA thrombus. No LV thrombus seen but apex not fully visualized on this TEE. Negative bubble study.     Carotid Doppler  No internal carotid artery stenosis bilaterally. Vertebrals with antegrade flow bilaterally.   CXR   08/14/2011 1.  Slight improvement in aeration in the left lower lobe, suggesting some resolving pneumonia or aspiration pneumonitis.  The radiographic appearance of the chest is otherwise essentially unchanged, as above.   08/13/2011   1.  Stable positioning of support apparatus.  No pneumothorax. 2.  Minimally improved aeration of the right mid and lower lung with persistent bilateral heterogeneous air space opacities, atelectasis versus infiltrate.   EKG  normal sinus rhythm, nonspecific ST and T waves changes, incomplete LBBB, LVH, LA enlargement, occasional PVC noted, unifocal (new).   Therapy Recommendations PT - OP; OT - OP; ST - OP  Physical Exam    The patient is alert and cooperative at the time of examination. Mild left facial droop is seen.  Extraocular movements are full, the patient has a mild relative right homonymous visual field deficit. The patient has a mild aphasia, difficulty with naming, and slight difficulty with repetition.  The patient has good strength of all 4 extremities, but mild spasticity is seen on the left hand and foot.  The patient has good finger-nose-finger and toe to finger bilaterally. Gait was not tested.  Deep tendon reflexes are symmetric, toes are neutral  bilaterally.  ASSESSMENT Mr. Nathaniel White is a 51 y.o. male with an embolic left parietal lobe infarct s/p VATS/decortication R lung for chronic pleural effusion 08/10/2011. Infarct embolic secondary to low EF vs other cardioembolic source.  On aspirin 81 mg orally every day prior to admission. Now on clopidogrel 75 mg orally every day for secondary stroke prevention. Patient with resultant expressive and receptive aphasia, improving. OP therapies recommended.  -hypertension -anemia -ESRD with kidney transplant w/ resultant rejection, back on HD 2012, TTS scheule -previous stroke, left hemiparesis face, arm and leg -LV dysfunction, EF 23-30% -MR, moderate to severe  Given the low EF by 2D echo, cardioembolic source of CVA is likely.  Hospital day # 8  TREATMENT/PLAN -Continue clopidogrel 75 mg orally every day for secondary stroke prevention. -avoid heparin during HD for  2 weeks -OP PT, OT and ST recommended -Stroke team will sign off  08/18/2011 8:50 AM   Please refer to [email or web address] for on-call Stroke MD    Lenor Coffin

## 2011-08-18 NOTE — Progress Notes (Signed)
Subjective:   Speech improving, moved to 2000.  Says has been moving around in the room and feels safe to go home with help from his people.  But states he would feel better to wait to go home until tomorrow.   Objective: Vital signs in last 24 hours: Temp:  [97.5 F (36.4 C)-98.9 F (37.2 C)] 98.2 F (36.8 C) (08/10 0412) Pulse Rate:  [76-95] 83  (08/10 0412) Resp:  [6-28] 19  (08/10 0412) BP: (117-167)/(71-113) 145/77 mmHg (08/10 0412) SpO2:  [94 %-100 %] 95 % (08/10 0412) Weight:  [71.124 kg (156 lb 12.8 oz)] 71.124 kg (156 lb 12.8 oz) (08/10 0412) Weight change: -1.676 kg (-3 lb 11.1 oz)  Intake/Output from previous day: 08/09 0701 - 08/10 0700 In: 720 [P.O.:720] Out: 0    EXAM: General appearance: Alert, comfortable, some difficulty with finding words Resp:  CTA without rales, rhonchi, or wheezes Cardio:  RRR without murmur GI: + BS, soft and nontender Extremities:  No edema Access:  AVF @ LUA with + bruit  Lab Results:  Basename 08/16/11 1035  WBC 6.1  HGB 9.5*  HCT 29.5*  PLT 144*   BMET:   Basename 08/16/11 1036  NA 135  K 4.1  CL 95*  CO2 24  GLUCOSE 113*  BUN 53*  CREATININE 10.36*  CALCIUM 9.0  ALBUMIN 2.2*   No results found for this basename: PTH:2 in the last 72 hours Iron Studies: No results found for this basename: IRON,TIBC,TRANSFERRIN,FERRITIN in the last 72 hours  Assessment/Plan: 1. S/p VATS/decortication of R lung for chronic pleural effusion - on 8/3 per Dr Prescott Gum, CTs removed 2. ESRD - normally HD via AVF on TTS at [facility]; K 4.1 yesterday. Getting treatment this AM  3. Anemia - on aranesp 40 mcg on Tues, hgb 9.5.  4. Secondary hyperparathyroidism - continue TUMs (increased during this hosp), sensipar and renavite, no vitamin D listed; P down to 4.9 at last check, Ca 9 (10.4 corrected).  5. HTN/volume - reasonable for situation (140/86); post HD wt 68.8 kg s/p UF 3 L on 8/7. Continue home BP meds (norvasc 10/clonidine/labetalol), UF goal  of 2 L today, current BP .134/85  6. Likely new CVA - acute embolic left parietal lobe infarct s/p VATS/decortication of R lung on 8/3; expressive aphasia improving; neuro and therapies now involved, on plavix. TEE was neg for cardiac embolic source. 7. Dispo- probable discharge home sometime this weekend.     LOS: 8 days   Velda Wendt A 08/18/2011,8:18 AM

## 2011-08-18 NOTE — Procedures (Signed)
Patient was seen on dialysis and the procedure was supervised.  BFR 400  Via AVF BP is  134/85.   Patient appears to be tolerating treatment well  Kennadi Albany A 08/18/2011

## 2011-08-18 NOTE — Progress Notes (Addendum)
1 Day Post-Op Procedure(s) (LRB): TRANSESOPHAGEAL ECHOCARDIOGRAM (TEE) (N/A)  Subjective: Mr. Hoganson has no complaints today.  He had HD this morning.  Objective: Vital signs in last 24 hours: Temp:  [97.1 F (36.2 C)-98.9 F (37.2 C)] 97.8 F (36.6 C) (08/10 1352) Pulse Rate:  [75-89] 78  (08/10 1352) Cardiac Rhythm:  [-] Normal sinus rhythm (08/10 1334) Resp:  [13-24] 18  (08/10 1352) BP: (126-156)/(71-90) 131/71 mmHg (08/10 1352) SpO2:  [93 %-98 %] 93 % (08/10 1352) Weight:  [152 lb 5.4 oz (69.1 kg)-156 lb 12.8 oz (71.124 kg)] 152 lb 5.4 oz (69.1 kg) (08/10 1230)  Intake/Output from previous day: 08/09 0701 - 08/10 0700 In: 720 [P.O.:720] Out: 0  Intake/Output this shift: Total I/O In: 120 [P.O.:120] Out: 1519 [Other:1519]  General appearance: alert, cooperative and no distress Heart: regular rate and rhythm Lungs: clear to auscultation bilaterally Abdomen: soft, non-tender; bowel sounds normal; no masses,  no organomegaly Extremities: edema trace Wound: clean and dry  Lab Results:  Basename 08/18/11 0805 08/16/11 1035  WBC 6.5 6.1  HGB 10.3* 9.5*  HCT 33.3* 29.5*  PLT 171 144*   BMET:  Basename 08/18/11 0805 08/16/11 1036  NA 138 135  K 4.4 4.1  CL 97 95*  CO2 25 24  GLUCOSE 109* 113*  BUN 49* 53*  CREATININE 9.12* 10.36*  CALCIUM 9.1 9.0    PT/INR: No results found for this basename: LABPROT,INR in the last 72 hours ABG    Component Value Date/Time   PHART 7.394 08/11/2011 0401   HCO3 27.6* 08/11/2011 0401   TCO2 29 08/11/2011 0401   O2SAT 94.0 08/11/2011 0401   CBG (last 3)  No results found for this basename: GLUCAP:3 in the last 72 hours  Assessment/Plan: S/P Procedure(s) (LRB): TRANSESOPHAGEAL ECHOCARDIOGRAM (TEE) (N/A)  1. Final chest tube removed yesterday, follow-up CXR free of pneumothorax 2. S/P CVA doing well, will require PT/OT at discharge Neuro signed off 3. ESRD- HD today, Neprhology following 4. Deconditioning- cont PT/OT 5. Dispo-  patient is progressing.  He is medically stable and could most likely be discharged home in the next 24-48 hours should no problems arise   LOS: 8 days    BARRETT, ERIN 08/18/2011   Patient seen and examined. Agree with above. Wants to go home tomorrow. Will see if home PT/OT arrangements can be made.

## 2011-08-19 MED ORDER — GUAIFENESIN ER 600 MG PO TB12: 600.0000 mg | ORAL_TABLET | Freq: Two times a day (BID) | ORAL | Status: DC

## 2011-08-19 MED ORDER — TRAMADOL HCL 50 MG PO TABS: 50.0000 mg | ORAL_TABLET | Freq: Four times a day (QID) | ORAL | Status: AC | PRN

## 2011-08-19 MED ORDER — CLOPIDOGREL BISULFATE 75 MG PO TABS: 75.0000 mg | ORAL_TABLET | Freq: Every day | ORAL | Status: DC

## 2011-08-19 NOTE — Progress Notes (Signed)
2 Days Post-Op Procedure(s) (LRB): TRANSESOPHAGEAL ECHOCARDIOGRAM (TEE) (N/A)  Subjective: Mr. Nathaniel White has no complaints this morning.  He is hoping to be discharged today.  Objective: Vital signs in last 24 hours: Temp:  [97.6 F (36.4 C)-98.8 F (37.1 C)] 98.8 F (37.1 C) (08/11 0428) Pulse Rate:  [75-89] 83  (08/11 0428) Cardiac Rhythm:  [-] Normal sinus rhythm (08/11 0750) Resp:  [13-22] 19  (08/11 0428) BP: (127-156)/(71-90) 128/75 mmHg (08/11 0428) SpO2:  [92 %-98 %] 92 % (08/11 0428) Weight:  [152 lb 5.4 oz (69.1 kg)-156 lb 1.6 oz (70.806 kg)] 156 lb 1.6 oz (70.806 kg) (08/11 0428)  Intake/Output from previous day: 08/10 0701 - 08/11 0700 In: 120 [P.O.:120] Out: 1519  Intake/Output this shift: Total I/O In: 120 [P.O.:120] Out: -   General appearance: alert, cooperative and no distress Neurologic: some mild aphasia Heart: regular rate and rhythm Lungs: clear to auscultation bilaterally Abdomen: soft, non-tender; bowel sounds normal; no masses,  no organomegaly Extremities: extremities normal, atraumatic, no cyanosis or edema Wound: clean and dry  Lab Results:  Basename 08/18/11 0805 08/16/11 1035  WBC 6.5 6.1  HGB 10.3* 9.5*  HCT 33.3* 29.5*  PLT 171 144*   BMET:  Basename 08/18/11 0805 08/16/11 1036  NA 138 135  K 4.4 4.1  CL 97 95*  CO2 25 24  GLUCOSE 109* 113*  BUN 49* 53*  CREATININE 9.12* 10.36*  CALCIUM 9.1 9.0    PT/INR: No results found for this basename: LABPROT,INR in the last 72 hours ABG    Component Value Date/Time   PHART 7.394 08/11/2011 0401   HCO3 27.6* 08/11/2011 0401   TCO2 29 08/11/2011 0401   O2SAT 94.0 08/11/2011 0401   CBG (last 3)  No results found for this basename: GLUCAP:3 in the last 72 hours  Assessment/Plan: S/P Procedure(s) (LRB): TRANSESOPHAGEAL ECHOCARDIOGRAM (TEE) (N/A)  1. S/P CVA doing well, some mild aphasia remains present 2. ESRD- Nephrology following, patient will resume outpatient HD schedule 3.  Deconditioning- PT/OT 4. Dispo- patient progressing well.  He is medically stable for discharge today.  He will need home PT/OT and nursing, arrangements will be made.     LOS: 9 days    Ellwood Handler 08/19/2011

## 2011-08-19 NOTE — Progress Notes (Signed)
Pt given all d/c education; pt verbalized understanding; will d/c home when ride arrives.

## 2011-08-19 NOTE — Progress Notes (Signed)
Subjective:   HD yest.  removed 1519, BP good this AM.  Ready to go home.  Still concerned slightly about speech deficits Objective: Vital signs in last 24 hours: Temp:  [97.1 F (36.2 C)-98.8 F (37.1 C)] 98.8 F (37.1 C) (08/11 0428) Pulse Rate:  [75-89] 83  (08/11 0428) Resp:  [13-24] 19  (08/11 0428) BP: (126-156)/(71-90) 128/75 mmHg (08/11 0428) SpO2:  [92 %-98 %] 92 % (08/11 0428) Weight:  [69.1 kg (152 lb 5.4 oz)-71.1 kg (156 lb 12 oz)] 70.806 kg (156 lb 1.6 oz) (08/11 0428) Weight change: -0.024 kg (-0.9 oz)  Intake/Output from previous day: 08/10 0701 - 08/11 0700 In: 120 [P.O.:120] Out: 1519  Total I/O In: 120 [P.O.:120] Out: -  EXAM: General appearance: Alert, comfortable, some difficulty with finding words Resp:  CTA without rales, rhonchi, or wheezes Cardio:  RRR without murmur GI: + BS, soft and nontender Extremities:  No edema Access:  AVF @ LUA with + bruit  Lab Results:  Basename 08/18/11 0805 08/16/11 1035  WBC 6.5 6.1  HGB 10.3* 9.5*  HCT 33.3* 29.5*  PLT 171 144*   BMET:   Basename 08/18/11 0805 08/16/11 1036  NA 138 135  K 4.4 4.1  CL 97 95*  CO2 25 24  GLUCOSE 109* 113*  BUN 49* 53*  CREATININE 9.12* 10.36*  CALCIUM 9.1 9.0  ALBUMIN 2.4* 2.2*   No results found for this basename: PTH:2 in the last 72 hours Iron Studies: No results found for this basename: IRON,TIBC,TRANSFERRIN,FERRITIN in the last 72 hours  Assessment/Plan: 1. S/p VATS/decortication of R lung for chronic pleural effusion - on 8/3 per Dr Prescott Gum, CTs removed 2. ESRD - normally HD via AVF on TTS at [facility]; K 4.1 yesterday. Has stayed on schedule during this hosp  3. Anemia - on aranesp 40 mcg on Tues, hgb 10.3  4. Secondary hyperparathyroidism - continue TUMs (increased during this hosp), sensipar and renavite, no vitamin D listed; P down to 4.9 at last check, Ca 9 (10.4 corrected).  5. HTN/volume - better; post HD wt 69 kg s/p UF 2 L on 8/10. Continue home BP meds (norvasc  10/clonidine/labetalol), 6.  new CVA - acute embolic left parietal lobe infarct s/p VATS/decortication of R lung on 8/3; expressive aphasia improving; neuro and therapies now involved, on plavix. TEE was neg for cardiac embolic source. 7. Dispo- probable discharge home today.     LOS: 9 days   Keil Pickering A 08/19/2011,7:41 AM

## 2011-08-19 NOTE — Discharge Summary (Signed)
Physician Discharge Summary  Patient ID: Nathaniel White MRN: [id number] DOB/AGE: Feb 02, 1960 51 y.o.  Admit date: 08/10/2011 Discharge date: 08/19/2011  Admission Diagnoses:  Patient Active Problem List  Diagnosis  . ESSENTIAL HYPERTENSION, BENIGN  . ESOPHAGEAL VARICES  . HEMATOCHEZIA  . END STAGE RENAL DISEASE  . Shortness of breath  . ESRD (end stage renal disease) on dialysis  . HTN (hypertension)  . H/O: CVA (cardiovascular accident)  . Pleural effusion  . Anemia  . Fluid overload  . History of non adherence to medical treatment  . DM (diabetes mellitus)  . Tobacco abuse   Discharge Diagnoses:   Patient Active Problem List  Diagnosis  . ESSENTIAL HYPERTENSION, BENIGN  . ESOPHAGEAL VARICES  . HEMATOCHEZIA  . END STAGE RENAL DISEASE  . Shortness of breath  . ESRD (end stage renal disease) on dialysis  . HTN (hypertension)  . H/O: CVA (cardiovascular accident)  . Pleural effusion  . Anemia  . Fluid overload  . History of non adherence to medical treatment  . DM (diabetes mellitus)  . Tobacco abuse  . Cerebral embolism with cerebral infarction   Right VATS   Discharged Condition: good  History of Present Illness:   Nathaniel White is a 51 yo African American male with known hypertension, ESRD S/P failed kidney transplant, DM, CVA and nicotine dependence.  He was referred to Dr. Prescott Gum for surgical evaluation in July 18, 2011 for recurrent right sided pleural effusion.  The patient required Thoracentesis on 2 separate occasions in June with removal of serosanguinous fluid with negative culture and cytology.  The patient was examined and noted to have a loud systolic murmur.  Chest xray was reviewed and showed probable entrapped Right lower lobe and recurrent pleural effusion.  It was felt the patient should undergo CT scan and echocardiogram for further workup.  The patient presented for follow up one week later.  Echocardiogram showed a reduced LV function with a  calcified mitral valve with moderate regurgitation.  CT scan of the chest showed resolution of air space disease but enhancing right pleural fluid and tissue with entrapment of the right lung.  It was felt that due to these findings the patient would benefit from VATS with decortication.  The risks and benefits of the procedure were explained to the patient and he was agreeable to proceed.  His surgery was scheduled for 08/10/2011.    Hospital Course:   On 08/10/2011 Mr. Scarce presented to [facility].  He was taken to the operating room and underwent a Right sided VATS with drainage of pleural effusion and decortication of the right lower lobe.  Fluid obtained was sent for culture.  The patient tolerated the procedure well.  He was extubated and taken to the PACU in stable condition.  POD #1 the patients chest tube remained on suction.  He resumed outpatient dialysis regimen.  POD #2  The patient developed confusion.  While he was being evaluated by Neurology.  They are familiar with the patient and states that no new focal neuro deficits were found.  A CT scan of the head was ordered by Dr. Prescott Gum.  POD #3 patient underwent head CT.  He was found to have suffered an acute left sided parietal lobe infarct.  Neurology was consulted and recommended further evaluation with MRI.  However due to use of surgical clips during procedure patient is unable to have MRI performed.  He was started on Plavix.  On exam patient with  aphasia and left sided weakness.  Speech and swallow study was performed and patient was found to be low risk for aspiration.  His diet was adjusted accordingly.  The patient underwent carotid duplex which did not reveal significant ICA stenosis.  POD #4 patient continues to have some problems with aphasia and memory.  His posterior chest tube was removed without difficulty. Neurology recommended TEE to rule out cardiac source of embolism.  POD #7 Neurology recommends avoidance of Heparin  for 2 weeks during hemodialysis.  The patient underwent TEE which did not reveal presence of a thrombus.  The patient's final chest tube was removed without difficulty.  He was transferred to the telemetry unit.  POD #9 the patient is doing well.  He does have some residual aphasia but his left sided weakness if mostly resolved.  He is ambulating independently and wishes to go home instead of a rehab center.  Most recent chest xray does not show any evidence of pneumothorax after chest tube removal.  He will resume his outpatient dialysis schedule of every Tuesday, Thursday, Saturday.  We will arrange home PT, OT, and SLP for further improvement of patients current status.  He is medically stable at this time and we will plan for discharge home today.  He will need to follow up with Neurology and Nephrology in 2 weeks time.  He will need to contact those offices to set up an appointment.  He will also need to follow up with his PCP in one week, which he will need to set up.  He will follow up with Dr. Prescott Gum in 2 weeks.  Our office will contact him with appointment date and time.     Consults: nephrology and neurology  Significant Diagnostic Studies: radiology: CT scan:   CT HEAD WITHOUT CONTRAST  Technique: Contiguous axial images were obtained from the base of  the skull through the vertex without contrast.  Comparison: None.   Findings: The diffuse cerebral atrophy. Low attenuation changes in  the deep white matter consistent with small vessel ischemia and old  lacunar infarct. Wedge-shaped poorly defined low attenuation focus  in the left parietal lobe with sulci effacement consistent with  acute infarct. No midline shift. No evidence of acute  intracranial hemorrhage. No abnormal extra-axial fluid collections.  Basal cisterns are not effaced. Visualized mastoid air cells and  paranasal sinuses are not opacified. No depressed skull fractures.  Vascular calcifications.   IMPRESSION:    Wedge-shaped low attenuation area in the left parietal lobe with  sulci effacement likely to represent acute infarct. No evidence of  acute intracranial hemorrhage.  Treatments: surgery:   1. Right VATS, mini thoracotomy and decortication of right lower lobe.  2. Placement of wound On-Q pain irrigation system.  Disposition: 01-Home or Self Care   Medication List  As of 08/19/2011  9:58 AM   STOP taking these medications         aspirin EC 81 MG tablet         TAKE these medications         amLODipine 10 MG tablet   Commonly known as: NORVASC   Take 1 tablet (10 mg total) by mouth at bedtime.      calcium carbonate 500 MG chewable tablet   Commonly known as: TUMS - dosed in mg elemental calcium   Chew 2 tablets (400 mg of elemental calcium total) by mouth 3 (three) times daily with meals.      cinacalcet 30 MG  tablet   Commonly known as: SENSIPAR   Take 30 mg by mouth daily.      cloNIDine 0.3 mg/24hr   Commonly known as: CATAPRES - Dosed in mg/24 hr   Place 1 patch onto the skin once a week. Change on Tuesday      clopidogrel 75 MG tablet   Commonly known as: PLAVIX   Take 1 tablet (75 mg total) by mouth daily with breakfast.      guaiFENesin 600 MG 12 hr tablet   Commonly known as: MUCINEX   Take 1 tablet (600 mg total) by mouth 2 (two) times daily.      labetalol 300 MG tablet   Commonly known as: NORMODYNE   Take 150 mg by mouth 2 (two) times daily.      multivitamin Tabs tablet   Take 1 tablet by mouth at bedtime.      traMADol 50 MG tablet   Commonly known as: ULTRAM   Take 1-2 tablets (50-100 mg total) by mouth every 6 (six) hours as needed.           Follow-up Information    Follow up with VAN Wilber Oliphant, MD in 2 weeks. (Office will contact you with appointment date and time)    Contact information:   [place] [phone]       Follow up with [place] in 2 weeks. (Please  get chest xray 1 hour prior to your appointment with Dr. Prescott Gum)       Follow up with Lenor Coffin, MD in 2 weeks. (Please contact office to make appointment)    Contact information:   [address] [place] [facility] [place] [phone]       Follow up with GOLDSBOROUGH,KELLIE A, MD in 2 weeks. (Please contact office to set up appointment)    Contact information:   [place] [phone]       Follow up with Windy Kalata, MD in 1 week. [facility] follow up)    Contact information:   [address] [place] [phone]          Signed: Ellwood Handler 08/19/2011, 9:58 AM

## 2011-08-19 NOTE — Progress Notes (Signed)
   CARE MANAGEMENT NOTE 08/19/2011  Patient:  Nathaniel White, Nathaniel White   Account Number:  [id number]  Date Initiated:  08/13/2011  Documentation initiated by:  Marvetta Gibbons  Subjective/Objective Assessment:   Pt admitted s/p thoracatomy,     Action/Plan:   PTA pt lived at home alone, independent, hx ESRD on HD t/th/sat   Anticipated DC Date:  08/16/2011   Anticipated DC Plan:  [place]  CM consult      [facility] Choice  HOME HEALTH   Choice offered to / List presented to:  C-1 Patient        [place] arranged  HH-1 RN  HH-3 OT  [place] agency  [place]   Status of service:  Completed, signed off Medicare Important Message given?   (If response is "NO", the following Medicare IM given date fields will be blank) Date Medicare IM given:   Date Additional Medicare IM given:    Discharge Disposition:  [place]  Per UR Regulation:  Reviewed for med. necessity/level of care/duration of stay  If discussed at [place] of Stay Meetings, dates discussed:   08/16/2011    Comments:  08/19/2011 1000 Spoke to pt and offered choice for [facility]. Pt does not have preference. Faxed orders to [facility] for [facility] PT, OT, SL and RN for scheduled d/c home today. Jonnie Finner RN CCM Case Mgmt phone [phone]  PCP- Fleet Contras T  08/15/11- [place] RN, BSN [phone] Pt with post op CVA- neuro following, CT remains, per PT/OT notes recommendations for outpt f/u with outpt PT/OT  08/13/11- 1124- Marvetta Gibbons RN, BSN [phone] UR completed, pt with CT transferred to SDU on 08/12/11, anticipate return home at discharge

## 2011-08-20 ENCOUNTER — Encounter (HOSPITAL_COMMUNITY): Payer: Self-pay | Admitting: Cardiology

## 2011-08-20 ENCOUNTER — Encounter (HOSPITAL_COMMUNITY): Payer: Self-pay

## 2011-08-21 DIAGNOSIS — N2581 Secondary hyperparathyroidism of renal origin: Secondary | ICD-10-CM | POA: Diagnosis not present

## 2011-08-21 DIAGNOSIS — N039 Chronic nephritic syndrome with unspecified morphologic changes: Secondary | ICD-10-CM | POA: Diagnosis not present

## 2011-08-21 DIAGNOSIS — D509 Iron deficiency anemia, unspecified: Secondary | ICD-10-CM | POA: Diagnosis not present

## 2011-08-21 DIAGNOSIS — N186 End stage renal disease: Secondary | ICD-10-CM | POA: Diagnosis not present

## 2011-08-22 DIAGNOSIS — J90 Pleural effusion, not elsewhere classified: Secondary | ICD-10-CM | POA: Diagnosis not present

## 2011-08-22 DIAGNOSIS — M6281 Muscle weakness (generalized): Secondary | ICD-10-CM | POA: Diagnosis not present

## 2011-08-22 DIAGNOSIS — I69959 Hemiplegia and hemiparesis following unspecified cerebrovascular disease affecting unspecified side: Secondary | ICD-10-CM | POA: Diagnosis not present

## 2011-08-22 DIAGNOSIS — Z992 Dependence on renal dialysis: Secondary | ICD-10-CM | POA: Diagnosis not present

## 2011-08-22 DIAGNOSIS — E119 Type 2 diabetes mellitus without complications: Secondary | ICD-10-CM | POA: Diagnosis not present

## 2011-08-22 DIAGNOSIS — R4701 Aphasia: Secondary | ICD-10-CM | POA: Diagnosis not present

## 2011-08-22 DIAGNOSIS — N186 End stage renal disease: Secondary | ICD-10-CM | POA: Diagnosis not present

## 2011-08-22 DIAGNOSIS — I69920 Aphasia following unspecified cerebrovascular disease: Secondary | ICD-10-CM | POA: Diagnosis not present

## 2011-08-22 DIAGNOSIS — I129 Hypertensive chronic kidney disease with stage 1 through stage 4 chronic kidney disease, or unspecified chronic kidney disease: Secondary | ICD-10-CM | POA: Diagnosis not present

## 2011-08-23 DIAGNOSIS — D631 Anemia in chronic kidney disease: Secondary | ICD-10-CM | POA: Diagnosis not present

## 2011-08-23 DIAGNOSIS — D509 Iron deficiency anemia, unspecified: Secondary | ICD-10-CM | POA: Diagnosis not present

## 2011-08-23 DIAGNOSIS — N2581 Secondary hyperparathyroidism of renal origin: Secondary | ICD-10-CM | POA: Diagnosis not present

## 2011-08-23 DIAGNOSIS — N186 End stage renal disease: Secondary | ICD-10-CM | POA: Diagnosis not present

## 2011-08-25 DIAGNOSIS — D509 Iron deficiency anemia, unspecified: Secondary | ICD-10-CM | POA: Diagnosis not present

## 2011-08-25 DIAGNOSIS — D631 Anemia in chronic kidney disease: Secondary | ICD-10-CM | POA: Diagnosis not present

## 2011-08-25 DIAGNOSIS — N2581 Secondary hyperparathyroidism of renal origin: Secondary | ICD-10-CM | POA: Diagnosis not present

## 2011-08-25 DIAGNOSIS — N186 End stage renal disease: Secondary | ICD-10-CM | POA: Diagnosis not present

## 2011-08-26 NOTE — Discharge Summary (Signed)
patient examined and medical record reviewed,agree with above note. VAN TRIGT III,Corneilus Heggie 08/26/2011

## 2011-08-27 DIAGNOSIS — N186 End stage renal disease: Secondary | ICD-10-CM | POA: Diagnosis not present

## 2011-08-27 DIAGNOSIS — I129 Hypertensive chronic kidney disease with stage 1 through stage 4 chronic kidney disease, or unspecified chronic kidney disease: Secondary | ICD-10-CM | POA: Diagnosis not present

## 2011-08-27 DIAGNOSIS — I69959 Hemiplegia and hemiparesis following unspecified cerebrovascular disease affecting unspecified side: Secondary | ICD-10-CM | POA: Diagnosis not present

## 2011-08-27 DIAGNOSIS — J90 Pleural effusion, not elsewhere classified: Secondary | ICD-10-CM | POA: Diagnosis not present

## 2011-08-27 DIAGNOSIS — I69920 Aphasia following unspecified cerebrovascular disease: Secondary | ICD-10-CM | POA: Diagnosis not present

## 2011-08-27 DIAGNOSIS — R4701 Aphasia: Secondary | ICD-10-CM | POA: Diagnosis not present

## 2011-08-28 DIAGNOSIS — D631 Anemia in chronic kidney disease: Secondary | ICD-10-CM | POA: Diagnosis not present

## 2011-08-28 DIAGNOSIS — N186 End stage renal disease: Secondary | ICD-10-CM | POA: Diagnosis not present

## 2011-08-28 DIAGNOSIS — D509 Iron deficiency anemia, unspecified: Secondary | ICD-10-CM | POA: Diagnosis not present

## 2011-08-28 DIAGNOSIS — N2581 Secondary hyperparathyroidism of renal origin: Secondary | ICD-10-CM | POA: Diagnosis not present

## 2011-08-29 DIAGNOSIS — I69920 Aphasia following unspecified cerebrovascular disease: Secondary | ICD-10-CM | POA: Diagnosis not present

## 2011-08-29 DIAGNOSIS — I129 Hypertensive chronic kidney disease with stage 1 through stage 4 chronic kidney disease, or unspecified chronic kidney disease: Secondary | ICD-10-CM | POA: Diagnosis not present

## 2011-08-29 DIAGNOSIS — I69959 Hemiplegia and hemiparesis following unspecified cerebrovascular disease affecting unspecified side: Secondary | ICD-10-CM | POA: Diagnosis not present

## 2011-08-29 DIAGNOSIS — N186 End stage renal disease: Secondary | ICD-10-CM | POA: Diagnosis not present

## 2011-08-29 DIAGNOSIS — J90 Pleural effusion, not elsewhere classified: Secondary | ICD-10-CM | POA: Diagnosis not present

## 2011-08-29 DIAGNOSIS — R4701 Aphasia: Secondary | ICD-10-CM | POA: Diagnosis not present

## 2011-08-30 DIAGNOSIS — N2581 Secondary hyperparathyroidism of renal origin: Secondary | ICD-10-CM | POA: Diagnosis not present

## 2011-08-30 DIAGNOSIS — D631 Anemia in chronic kidney disease: Secondary | ICD-10-CM | POA: Diagnosis not present

## 2011-08-30 DIAGNOSIS — N186 End stage renal disease: Secondary | ICD-10-CM | POA: Diagnosis not present

## 2011-08-30 DIAGNOSIS — D509 Iron deficiency anemia, unspecified: Secondary | ICD-10-CM | POA: Diagnosis not present

## 2011-08-31 DIAGNOSIS — N186 End stage renal disease: Secondary | ICD-10-CM | POA: Diagnosis not present

## 2011-08-31 DIAGNOSIS — I69920 Aphasia following unspecified cerebrovascular disease: Secondary | ICD-10-CM | POA: Diagnosis not present

## 2011-08-31 DIAGNOSIS — I69959 Hemiplegia and hemiparesis following unspecified cerebrovascular disease affecting unspecified side: Secondary | ICD-10-CM | POA: Diagnosis not present

## 2011-08-31 DIAGNOSIS — J90 Pleural effusion, not elsewhere classified: Secondary | ICD-10-CM | POA: Diagnosis not present

## 2011-08-31 DIAGNOSIS — I129 Hypertensive chronic kidney disease with stage 1 through stage 4 chronic kidney disease, or unspecified chronic kidney disease: Secondary | ICD-10-CM | POA: Diagnosis not present

## 2011-08-31 DIAGNOSIS — R4701 Aphasia: Secondary | ICD-10-CM | POA: Diagnosis not present

## 2011-09-01 DIAGNOSIS — N2581 Secondary hyperparathyroidism of renal origin: Secondary | ICD-10-CM | POA: Diagnosis not present

## 2011-09-01 DIAGNOSIS — N186 End stage renal disease: Secondary | ICD-10-CM | POA: Diagnosis not present

## 2011-09-01 DIAGNOSIS — N039 Chronic nephritic syndrome with unspecified morphologic changes: Secondary | ICD-10-CM | POA: Diagnosis not present

## 2011-09-01 DIAGNOSIS — D509 Iron deficiency anemia, unspecified: Secondary | ICD-10-CM | POA: Diagnosis not present

## 2011-09-03 ENCOUNTER — Other Ambulatory Visit: Payer: Self-pay | Admitting: Cardiothoracic Surgery

## 2011-09-03 DIAGNOSIS — N186 End stage renal disease: Secondary | ICD-10-CM | POA: Diagnosis not present

## 2011-09-03 DIAGNOSIS — R4701 Aphasia: Secondary | ICD-10-CM | POA: Diagnosis not present

## 2011-09-03 DIAGNOSIS — I69920 Aphasia following unspecified cerebrovascular disease: Secondary | ICD-10-CM | POA: Diagnosis not present

## 2011-09-03 DIAGNOSIS — J90 Pleural effusion, not elsewhere classified: Secondary | ICD-10-CM | POA: Diagnosis not present

## 2011-09-03 DIAGNOSIS — I69959 Hemiplegia and hemiparesis following unspecified cerebrovascular disease affecting unspecified side: Secondary | ICD-10-CM | POA: Diagnosis not present

## 2011-09-03 DIAGNOSIS — I129 Hypertensive chronic kidney disease with stage 1 through stage 4 chronic kidney disease, or unspecified chronic kidney disease: Secondary | ICD-10-CM | POA: Diagnosis not present

## 2011-09-04 DIAGNOSIS — N039 Chronic nephritic syndrome with unspecified morphologic changes: Secondary | ICD-10-CM | POA: Diagnosis not present

## 2011-09-04 DIAGNOSIS — N186 End stage renal disease: Secondary | ICD-10-CM | POA: Diagnosis not present

## 2011-09-04 DIAGNOSIS — N2581 Secondary hyperparathyroidism of renal origin: Secondary | ICD-10-CM | POA: Diagnosis not present

## 2011-09-04 DIAGNOSIS — D509 Iron deficiency anemia, unspecified: Secondary | ICD-10-CM | POA: Diagnosis not present

## 2011-09-05 ENCOUNTER — Encounter: Payer: Self-pay | Admitting: Cardiothoracic Surgery

## 2011-09-05 ENCOUNTER — Ambulatory Visit (INDEPENDENT_AMBULATORY_CARE_PROVIDER_SITE_OTHER): Payer: Self-pay | Admitting: Cardiothoracic Surgery

## 2011-09-05 ENCOUNTER — Ambulatory Visit
Admission: RE | Admit: 2011-09-05 | Discharge: 2011-09-05 | Disposition: A | Discharge status: Home / Self Care | Payer: Medicare Other | Source: Ambulatory Visit | Attending: Cardiothoracic Surgery | Admitting: Cardiothoracic Surgery

## 2011-09-05 VITALS — BP 155/87 | HR 92 | Resp 18 | Ht 69.0 in | Wt 164.0 lb

## 2011-09-05 DIAGNOSIS — Z09 Encounter for follow-up examination after completed treatment for conditions other than malignant neoplasm: Secondary | ICD-10-CM

## 2011-09-05 DIAGNOSIS — J90 Pleural effusion, not elsewhere classified: Secondary | ICD-10-CM

## 2011-09-05 NOTE — Progress Notes (Signed)
PCP is Windy Kalata, MD Referring Provider is Windy Kalata, MD  Chief Complaint  Patient presents with  . Routine Post Op    3 week f/u from surgery with CXR, S/P Rt VATS, decortication of right lower lobe on 08/10/11    HPI: 51 year old black male on chronic hemodialysis returns for his first office visit after right thoracotomy and decortication of right lower lobe with removal a  Leather like l peel. No active infection no evidence of cancer on pathology. The patient states he is breathing much better. The incision is healed well. The patient developed a postoperative CVA with some expressive aphasia and right-sided weakness which is improved. He was seen by Dr. Jannifer Franklin in the hospital.   Past Medical History  Diagnosis Date  . Hypertension   . Renal insufficiency   . Anemia   . Stroke   . Hemiparesis due to old stroke     left  . LV dysfunction     EF 25-30% by echo 07/2011  . MR (mitral regurgitation)     moderate to severe, echo 07/2011  . Shortness of breath     Past Surgical History  Procedure Date  . Kidney transplant     2011 rejected kidney 2012 back on dialysis  . Av fistula placement   . Cardiac catheterization     [place] without cardioversion 08/17/2011    Procedure: TRANSESOPHAGEAL ECHOCARDIOGRAM (TEE);  Surgeon: Larey Dresser, MD;  Location: [place];  Service: Cardiovascular;  Laterality: N/A;    No family history on file.  Social History History  Substance Use Topics  . Smoking status: Current Everyday Smoker    Types: Cigarettes  . Smokeless tobacco: Not on file  . Alcohol Use: No    Current Outpatient Prescriptions  Medication Sig Dispense Refill  . amLODipine (NORVASC) 10 MG tablet Take 1 tablet (10 mg total) by mouth at bedtime.  30 tablet  0  . calcium carbonate (TUMS - DOSED IN MG ELEMENTAL CALCIUM) 500 MG chewable tablet Chew 2 tablets (400 mg of elemental calcium total) by mouth 3 (three) times daily with  meals.      . cinacalcet (SENSIPAR) 30 MG tablet Take 30 mg by mouth daily.      . cloNIDine (CATAPRES - DOSED IN MG/24 HR) 0.3 mg/24hr Place 1 patch onto the skin once a week. Change on Tuesday      . clopidogrel (PLAVIX) 75 MG tablet Take 1 tablet (75 mg total) by mouth daily with breakfast.  30 tablet  5  . guaiFENesin (MUCINEX) 600 MG 12 hr tablet Take 1 tablet (600 mg total) by mouth 2 (two) times daily.      Marland Kitchen labetalol (NORMODYNE) 300 MG tablet Take 150 mg by mouth 2 (two) times daily.      . multivitamin (RENA-VIT) TABS tablet Take 1 tablet by mouth at bedtime.  30 tablet  0    Allergies  Allergen Reactions  . Codeine Shortness Of Breath    Review of Systems no fever no productive cough no significant postthoracotomy pain  BP 155/87  Pulse 92  Resp 18  Ht 5\' 9"  (1.753 m)  Wt 164 lb (74.39 kg)  BMI 24.22 kg/m2  SpO2 90% Physical Exam Alert and comfortable Breath sounds not clear and the 2 side Right thoracotomy incision well-healed Heart rhythm regular Mild weakness on the right lower extremity No evidence of ectasia  Diagnostic Tests:  Chest x-ray shows significant improvement in  right lung volume no significant pleural effusion some postoperative changes noted    Plan: Patient may drive. Nonsmoking is recommended. We'll follow patient in one month with chest x-ray to assess progress. He may lift up to 20 pounds. No pain medications were prescribed.

## 2011-09-06 DIAGNOSIS — D631 Anemia in chronic kidney disease: Secondary | ICD-10-CM | POA: Diagnosis not present

## 2011-09-06 DIAGNOSIS — N2581 Secondary hyperparathyroidism of renal origin: Secondary | ICD-10-CM | POA: Diagnosis not present

## 2011-09-06 DIAGNOSIS — D509 Iron deficiency anemia, unspecified: Secondary | ICD-10-CM | POA: Diagnosis not present

## 2011-09-06 DIAGNOSIS — N186 End stage renal disease: Secondary | ICD-10-CM | POA: Diagnosis not present

## 2011-09-07 DIAGNOSIS — I69959 Hemiplegia and hemiparesis following unspecified cerebrovascular disease affecting unspecified side: Secondary | ICD-10-CM | POA: Diagnosis not present

## 2011-09-07 DIAGNOSIS — I69920 Aphasia following unspecified cerebrovascular disease: Secondary | ICD-10-CM | POA: Diagnosis not present

## 2011-09-07 DIAGNOSIS — N186 End stage renal disease: Secondary | ICD-10-CM | POA: Diagnosis not present

## 2011-09-07 DIAGNOSIS — R4701 Aphasia: Secondary | ICD-10-CM | POA: Diagnosis not present

## 2011-09-07 DIAGNOSIS — I129 Hypertensive chronic kidney disease with stage 1 through stage 4 chronic kidney disease, or unspecified chronic kidney disease: Secondary | ICD-10-CM | POA: Diagnosis not present

## 2011-09-07 DIAGNOSIS — J90 Pleural effusion, not elsewhere classified: Secondary | ICD-10-CM | POA: Diagnosis not present

## 2011-09-08 DIAGNOSIS — D509 Iron deficiency anemia, unspecified: Secondary | ICD-10-CM | POA: Diagnosis not present

## 2011-09-08 DIAGNOSIS — N2581 Secondary hyperparathyroidism of renal origin: Secondary | ICD-10-CM | POA: Diagnosis not present

## 2011-09-08 DIAGNOSIS — N186 End stage renal disease: Secondary | ICD-10-CM | POA: Diagnosis not present

## 2011-09-08 DIAGNOSIS — N039 Chronic nephritic syndrome with unspecified morphologic changes: Secondary | ICD-10-CM | POA: Diagnosis not present

## 2011-09-11 DIAGNOSIS — N2581 Secondary hyperparathyroidism of renal origin: Secondary | ICD-10-CM | POA: Diagnosis not present

## 2011-09-11 DIAGNOSIS — Z23 Encounter for immunization: Secondary | ICD-10-CM | POA: Diagnosis not present

## 2011-09-11 DIAGNOSIS — D631 Anemia in chronic kidney disease: Secondary | ICD-10-CM | POA: Diagnosis not present

## 2011-09-11 DIAGNOSIS — N186 End stage renal disease: Secondary | ICD-10-CM | POA: Diagnosis not present

## 2011-09-11 DIAGNOSIS — D509 Iron deficiency anemia, unspecified: Secondary | ICD-10-CM | POA: Diagnosis not present

## 2011-09-12 DIAGNOSIS — I69959 Hemiplegia and hemiparesis following unspecified cerebrovascular disease affecting unspecified side: Secondary | ICD-10-CM | POA: Diagnosis not present

## 2011-09-12 DIAGNOSIS — N186 End stage renal disease: Secondary | ICD-10-CM | POA: Diagnosis not present

## 2011-09-12 DIAGNOSIS — I129 Hypertensive chronic kidney disease with stage 1 through stage 4 chronic kidney disease, or unspecified chronic kidney disease: Secondary | ICD-10-CM | POA: Diagnosis not present

## 2011-09-12 DIAGNOSIS — I69920 Aphasia following unspecified cerebrovascular disease: Secondary | ICD-10-CM | POA: Diagnosis not present

## 2011-09-12 DIAGNOSIS — J90 Pleural effusion, not elsewhere classified: Secondary | ICD-10-CM | POA: Diagnosis not present

## 2011-09-12 DIAGNOSIS — R4701 Aphasia: Secondary | ICD-10-CM | POA: Diagnosis not present

## 2011-09-14 DIAGNOSIS — I69959 Hemiplegia and hemiparesis following unspecified cerebrovascular disease affecting unspecified side: Secondary | ICD-10-CM | POA: Diagnosis not present

## 2011-09-14 DIAGNOSIS — R4701 Aphasia: Secondary | ICD-10-CM | POA: Diagnosis not present

## 2011-09-14 DIAGNOSIS — J90 Pleural effusion, not elsewhere classified: Secondary | ICD-10-CM | POA: Diagnosis not present

## 2011-09-14 DIAGNOSIS — N186 End stage renal disease: Secondary | ICD-10-CM | POA: Diagnosis not present

## 2011-09-14 DIAGNOSIS — I69920 Aphasia following unspecified cerebrovascular disease: Secondary | ICD-10-CM | POA: Diagnosis not present

## 2011-09-14 DIAGNOSIS — I129 Hypertensive chronic kidney disease with stage 1 through stage 4 chronic kidney disease, or unspecified chronic kidney disease: Secondary | ICD-10-CM | POA: Diagnosis not present

## 2011-09-17 DIAGNOSIS — N186 End stage renal disease: Secondary | ICD-10-CM | POA: Diagnosis not present

## 2011-09-17 DIAGNOSIS — I69920 Aphasia following unspecified cerebrovascular disease: Secondary | ICD-10-CM | POA: Diagnosis not present

## 2011-09-17 DIAGNOSIS — I69959 Hemiplegia and hemiparesis following unspecified cerebrovascular disease affecting unspecified side: Secondary | ICD-10-CM | POA: Diagnosis not present

## 2011-09-17 DIAGNOSIS — R4701 Aphasia: Secondary | ICD-10-CM | POA: Diagnosis not present

## 2011-09-17 DIAGNOSIS — I129 Hypertensive chronic kidney disease with stage 1 through stage 4 chronic kidney disease, or unspecified chronic kidney disease: Secondary | ICD-10-CM | POA: Diagnosis not present

## 2011-09-17 DIAGNOSIS — J90 Pleural effusion, not elsewhere classified: Secondary | ICD-10-CM | POA: Diagnosis not present

## 2011-09-19 DIAGNOSIS — I69959 Hemiplegia and hemiparesis following unspecified cerebrovascular disease affecting unspecified side: Secondary | ICD-10-CM | POA: Diagnosis not present

## 2011-09-19 DIAGNOSIS — I69920 Aphasia following unspecified cerebrovascular disease: Secondary | ICD-10-CM | POA: Diagnosis not present

## 2011-09-19 DIAGNOSIS — I129 Hypertensive chronic kidney disease with stage 1 through stage 4 chronic kidney disease, or unspecified chronic kidney disease: Secondary | ICD-10-CM | POA: Diagnosis not present

## 2011-09-19 DIAGNOSIS — N186 End stage renal disease: Secondary | ICD-10-CM | POA: Diagnosis not present

## 2011-09-19 DIAGNOSIS — R4701 Aphasia: Secondary | ICD-10-CM | POA: Diagnosis not present

## 2011-09-19 DIAGNOSIS — J90 Pleural effusion, not elsewhere classified: Secondary | ICD-10-CM | POA: Diagnosis not present

## 2011-09-21 DIAGNOSIS — I69959 Hemiplegia and hemiparesis following unspecified cerebrovascular disease affecting unspecified side: Secondary | ICD-10-CM | POA: Diagnosis not present

## 2011-09-21 DIAGNOSIS — N186 End stage renal disease: Secondary | ICD-10-CM | POA: Diagnosis not present

## 2011-09-21 DIAGNOSIS — J90 Pleural effusion, not elsewhere classified: Secondary | ICD-10-CM | POA: Diagnosis not present

## 2011-09-21 DIAGNOSIS — I69920 Aphasia following unspecified cerebrovascular disease: Secondary | ICD-10-CM | POA: Diagnosis not present

## 2011-09-21 DIAGNOSIS — R4701 Aphasia: Secondary | ICD-10-CM | POA: Diagnosis not present

## 2011-09-21 DIAGNOSIS — I129 Hypertensive chronic kidney disease with stage 1 through stage 4 chronic kidney disease, or unspecified chronic kidney disease: Secondary | ICD-10-CM | POA: Diagnosis not present

## 2011-09-22 DIAGNOSIS — D631 Anemia in chronic kidney disease: Secondary | ICD-10-CM | POA: Diagnosis not present

## 2011-09-22 DIAGNOSIS — N2581 Secondary hyperparathyroidism of renal origin: Secondary | ICD-10-CM | POA: Diagnosis not present

## 2011-09-22 DIAGNOSIS — N186 End stage renal disease: Secondary | ICD-10-CM | POA: Diagnosis not present

## 2011-09-26 DIAGNOSIS — I129 Hypertensive chronic kidney disease with stage 1 through stage 4 chronic kidney disease, or unspecified chronic kidney disease: Secondary | ICD-10-CM | POA: Diagnosis not present

## 2011-09-26 DIAGNOSIS — I69920 Aphasia following unspecified cerebrovascular disease: Secondary | ICD-10-CM | POA: Diagnosis not present

## 2011-09-26 DIAGNOSIS — J90 Pleural effusion, not elsewhere classified: Secondary | ICD-10-CM | POA: Diagnosis not present

## 2011-09-26 DIAGNOSIS — R4701 Aphasia: Secondary | ICD-10-CM | POA: Diagnosis not present

## 2011-09-26 DIAGNOSIS — N186 End stage renal disease: Secondary | ICD-10-CM | POA: Diagnosis not present

## 2011-09-26 DIAGNOSIS — I69959 Hemiplegia and hemiparesis following unspecified cerebrovascular disease affecting unspecified side: Secondary | ICD-10-CM | POA: Diagnosis not present

## 2011-09-28 DIAGNOSIS — R4701 Aphasia: Secondary | ICD-10-CM | POA: Diagnosis not present

## 2011-09-28 DIAGNOSIS — I69959 Hemiplegia and hemiparesis following unspecified cerebrovascular disease affecting unspecified side: Secondary | ICD-10-CM | POA: Diagnosis not present

## 2011-09-28 DIAGNOSIS — N186 End stage renal disease: Secondary | ICD-10-CM | POA: Diagnosis not present

## 2011-09-28 DIAGNOSIS — I129 Hypertensive chronic kidney disease with stage 1 through stage 4 chronic kidney disease, or unspecified chronic kidney disease: Secondary | ICD-10-CM | POA: Diagnosis not present

## 2011-09-28 DIAGNOSIS — I69920 Aphasia following unspecified cerebrovascular disease: Secondary | ICD-10-CM | POA: Diagnosis not present

## 2011-09-28 DIAGNOSIS — J90 Pleural effusion, not elsewhere classified: Secondary | ICD-10-CM | POA: Diagnosis not present

## 2011-10-01 ENCOUNTER — Other Ambulatory Visit: Payer: Self-pay | Admitting: Cardiothoracic Surgery

## 2011-10-01 DIAGNOSIS — R4701 Aphasia: Secondary | ICD-10-CM | POA: Diagnosis not present

## 2011-10-01 DIAGNOSIS — I69920 Aphasia following unspecified cerebrovascular disease: Secondary | ICD-10-CM | POA: Diagnosis not present

## 2011-10-01 DIAGNOSIS — I129 Hypertensive chronic kidney disease with stage 1 through stage 4 chronic kidney disease, or unspecified chronic kidney disease: Secondary | ICD-10-CM | POA: Diagnosis not present

## 2011-10-01 DIAGNOSIS — N186 End stage renal disease: Secondary | ICD-10-CM | POA: Diagnosis not present

## 2011-10-01 DIAGNOSIS — J90 Pleural effusion, not elsewhere classified: Secondary | ICD-10-CM

## 2011-10-01 DIAGNOSIS — I69959 Hemiplegia and hemiparesis following unspecified cerebrovascular disease affecting unspecified side: Secondary | ICD-10-CM | POA: Diagnosis not present

## 2011-10-03 ENCOUNTER — Ambulatory Visit (INDEPENDENT_AMBULATORY_CARE_PROVIDER_SITE_OTHER): Payer: Self-pay | Admitting: Cardiothoracic Surgery

## 2011-10-03 ENCOUNTER — Ambulatory Visit
Admission: RE | Admit: 2011-10-03 | Discharge: 2011-10-03 | Disposition: A | Discharge status: Home / Self Care | Payer: Medicare Other | Source: Ambulatory Visit | Attending: Cardiothoracic Surgery | Admitting: Cardiothoracic Surgery

## 2011-10-03 ENCOUNTER — Encounter: Payer: Self-pay | Admitting: Cardiothoracic Surgery

## 2011-10-03 VITALS — BP 168/96 | HR 94 | Resp 20 | Ht 69.0 in | Wt 170.0 lb

## 2011-10-03 DIAGNOSIS — J90 Pleural effusion, not elsewhere classified: Secondary | ICD-10-CM | POA: Diagnosis not present

## 2011-10-03 DIAGNOSIS — Z09 Encounter for follow-up examination after completed treatment for conditions other than malignant neoplasm: Secondary | ICD-10-CM

## 2011-10-03 NOTE — Progress Notes (Signed)
PCP is Windy Kalata, MD Referring Provider is Windy Kalata, MD  Chief Complaint  Patient presents with  . Routine Post Op    1 month f/u with CXR, S/P RT VATS, decort. of right lower lobe on 08/10/11    HPI: One month status post right VATS and decortication of loculated effusion. Fluid cultures negative. Patient with probable hypertensive cardiomyopathy , mitral regurgitation and pulmonary hypertension with EF of 30% on preop echo. Incision healing well. Patient's shortness of breath improved. No problems with hemodialysis for his chronic renal failure. Chest x-ray shows significant improvement in aeration of right lung on decortication Patient had postoperative stroke with some transient memory and speech problems. He is placed on Plavix by neurology.  Past Medical History  Diagnosis Date  . Hypertension   . Renal insufficiency   . Anemia   . Stroke   . Hemiparesis due to old stroke     left  . LV dysfunction     EF 25-30% by echo 07/2011  . MR (mitral regurgitation)     moderate to severe, echo 07/2011  . Shortness of breath     Past Surgical History  Procedure Date  . Kidney transplant     2011 rejected kidney 2012 back on dialysis  . Av fistula placement   . Cardiac catheterization     [place] without cardioversion 08/17/2011    Procedure: TRANSESOPHAGEAL ECHOCARDIOGRAM (TEE);  Surgeon: Larey Dresser, MD;  Location: [place];  Service: Cardiovascular;  Laterality: N/A;    No family history on file.  Social History History  Substance Use Topics  . Smoking status: Current Every Day Smoker    Types: Cigarettes  . Smokeless tobacco: Not on file  . Alcohol Use: No    Current Outpatient Prescriptions  Medication Sig Dispense Refill  . amLODipine (NORVASC) 10 MG tablet Take 1 tablet (10 mg total) by mouth at bedtime.  30 tablet  0  . calcium carbonate (TUMS - DOSED IN MG ELEMENTAL CALCIUM) 500 MG chewable tablet Chew 2 tablets (400 mg  of elemental calcium total) by mouth 3 (three) times daily with meals.      . cinacalcet (SENSIPAR) 30 MG tablet Take 30 mg by mouth daily.      . cloNIDine (CATAPRES - DOSED IN MG/24 HR) 0.3 mg/24hr Place 1 patch onto the skin once a week. Change on Tuesday      . clopidogrel (PLAVIX) 75 MG tablet Take 1 tablet (75 mg total) by mouth daily with breakfast.  30 tablet  5  . guaiFENesin (MUCINEX) 600 MG 12 hr tablet Take 1 tablet (600 mg total) by mouth 2 (two) times daily.      Marland Kitchen labetalol (NORMODYNE) 300 MG tablet Take 150 mg by mouth 2 (two) times daily.      . multivitamin (RENA-VIT) TABS tablet Take 1 tablet by mouth at bedtime.  30 tablet  0    Allergies  Allergen Reactions  . Codeine Shortness Of Breath    Review of Systems no fever, good appetite, incision healing well, no significant pain still smoking a few cigarettes daily  BP 168/96  Pulse 94  Resp 20  Ht 5\' 9"  (1.753 m)  Wt 170 lb (77.111 kg)  BMI 25.10 kg/m2  SpO2 92% Physical Exam Alert and comfortable. Thoracotomy incision well-healed Breath sounds clear and equal Cardiac rhythm regular No peripheral edema Diagnostic Tests: Chest x-ray with some postoperative changes but significant improvement in aeration no  significant residual pleural effusion  Impression: Stable course following right VATS for decortication of loculated effusion, now improved  Plan: Return as needed. Management of moderate severe mitral regurgitation with pulmonary hypertension should be medical, controlled blood pressure and optimal hemodialysis.Marland Kitchen

## 2011-10-08 DIAGNOSIS — N186 End stage renal disease: Secondary | ICD-10-CM | POA: Diagnosis not present

## 2011-10-09 DIAGNOSIS — N186 End stage renal disease: Secondary | ICD-10-CM | POA: Diagnosis not present

## 2011-10-09 DIAGNOSIS — Z992 Dependence on renal dialysis: Secondary | ICD-10-CM | POA: Diagnosis not present

## 2011-10-09 DIAGNOSIS — D631 Anemia in chronic kidney disease: Secondary | ICD-10-CM | POA: Diagnosis not present

## 2011-10-09 DIAGNOSIS — N2581 Secondary hyperparathyroidism of renal origin: Secondary | ICD-10-CM | POA: Diagnosis not present

## 2011-10-09 DIAGNOSIS — D509 Iron deficiency anemia, unspecified: Secondary | ICD-10-CM | POA: Diagnosis not present

## 2011-10-26 DIAGNOSIS — L608 Other nail disorders: Secondary | ICD-10-CM | POA: Diagnosis not present

## 2011-10-26 DIAGNOSIS — B351 Tinea unguium: Secondary | ICD-10-CM | POA: Diagnosis not present

## 2011-10-26 DIAGNOSIS — I739 Peripheral vascular disease, unspecified: Secondary | ICD-10-CM | POA: Diagnosis not present

## 2011-10-26 DIAGNOSIS — M79609 Pain in unspecified limb: Secondary | ICD-10-CM | POA: Diagnosis not present

## 2011-10-26 DIAGNOSIS — E1159 Type 2 diabetes mellitus with other circulatory complications: Secondary | ICD-10-CM | POA: Diagnosis not present

## 2011-10-26 DIAGNOSIS — L84 Corns and callosities: Secondary | ICD-10-CM | POA: Diagnosis not present

## 2011-11-08 DIAGNOSIS — N186 End stage renal disease: Secondary | ICD-10-CM | POA: Diagnosis not present

## 2011-11-10 DIAGNOSIS — N186 End stage renal disease: Secondary | ICD-10-CM | POA: Diagnosis not present

## 2011-11-10 DIAGNOSIS — N2581 Secondary hyperparathyroidism of renal origin: Secondary | ICD-10-CM | POA: Diagnosis not present

## 2011-11-10 DIAGNOSIS — D631 Anemia in chronic kidney disease: Secondary | ICD-10-CM | POA: Diagnosis not present

## 2011-11-10 DIAGNOSIS — D509 Iron deficiency anemia, unspecified: Secondary | ICD-10-CM | POA: Diagnosis not present

## 2011-12-08 DIAGNOSIS — N186 End stage renal disease: Secondary | ICD-10-CM | POA: Diagnosis not present

## 2011-12-11 DIAGNOSIS — D509 Iron deficiency anemia, unspecified: Secondary | ICD-10-CM | POA: Diagnosis not present

## 2011-12-11 DIAGNOSIS — N186 End stage renal disease: Secondary | ICD-10-CM | POA: Diagnosis not present

## 2011-12-11 DIAGNOSIS — N2581 Secondary hyperparathyroidism of renal origin: Secondary | ICD-10-CM | POA: Diagnosis not present

## 2011-12-11 DIAGNOSIS — E119 Type 2 diabetes mellitus without complications: Secondary | ICD-10-CM | POA: Diagnosis not present

## 2011-12-11 DIAGNOSIS — D631 Anemia in chronic kidney disease: Secondary | ICD-10-CM | POA: Diagnosis not present

## 2012-01-03 DIAGNOSIS — N186 End stage renal disease: Secondary | ICD-10-CM | POA: Diagnosis not present

## 2012-01-08 DIAGNOSIS — N186 End stage renal disease: Secondary | ICD-10-CM | POA: Diagnosis not present

## 2012-01-10 DIAGNOSIS — D509 Iron deficiency anemia, unspecified: Secondary | ICD-10-CM | POA: Diagnosis not present

## 2012-01-10 DIAGNOSIS — D631 Anemia in chronic kidney disease: Secondary | ICD-10-CM | POA: Diagnosis not present

## 2012-01-10 DIAGNOSIS — N186 End stage renal disease: Secondary | ICD-10-CM | POA: Diagnosis not present

## 2012-01-10 DIAGNOSIS — N2581 Secondary hyperparathyroidism of renal origin: Secondary | ICD-10-CM | POA: Diagnosis not present

## 2012-02-08 DIAGNOSIS — N186 End stage renal disease: Secondary | ICD-10-CM | POA: Diagnosis not present

## 2012-02-09 DIAGNOSIS — N2581 Secondary hyperparathyroidism of renal origin: Secondary | ICD-10-CM | POA: Diagnosis not present

## 2012-02-09 DIAGNOSIS — N186 End stage renal disease: Secondary | ICD-10-CM | POA: Diagnosis not present

## 2012-02-09 DIAGNOSIS — D631 Anemia in chronic kidney disease: Secondary | ICD-10-CM | POA: Diagnosis not present

## 2012-03-04 ENCOUNTER — Emergency Department (HOSPITAL_BASED_OUTPATIENT_CLINIC_OR_DEPARTMENT_OTHER)
Admission: EM | Admit: 2012-03-04 | Discharge: 2012-03-04 | Disposition: A | Discharge status: Home / Self Care | Payer: Medicare Other | Attending: Emergency Medicine | Admitting: Emergency Medicine

## 2012-03-04 ENCOUNTER — Emergency Department (HOSPITAL_BASED_OUTPATIENT_CLINIC_OR_DEPARTMENT_OTHER): Payer: Medicare Other

## 2012-03-04 ENCOUNTER — Encounter (HOSPITAL_BASED_OUTPATIENT_CLINIC_OR_DEPARTMENT_OTHER): Payer: Self-pay | Admitting: Emergency Medicine

## 2012-03-04 DIAGNOSIS — Z940 Kidney transplant status: Secondary | ICD-10-CM | POA: Diagnosis not present

## 2012-03-04 DIAGNOSIS — F17200 Nicotine dependence, unspecified, uncomplicated: Secondary | ICD-10-CM | POA: Insufficient documentation

## 2012-03-04 DIAGNOSIS — I120 Hypertensive chronic kidney disease with stage 5 chronic kidney disease or end stage renal disease: Secondary | ICD-10-CM | POA: Insufficient documentation

## 2012-03-04 DIAGNOSIS — R0602 Shortness of breath: Secondary | ICD-10-CM | POA: Diagnosis not present

## 2012-03-04 DIAGNOSIS — Z7902 Long term (current) use of antithrombotics/antiplatelets: Secondary | ICD-10-CM | POA: Diagnosis not present

## 2012-03-04 DIAGNOSIS — Z992 Dependence on renal dialysis: Secondary | ICD-10-CM | POA: Insufficient documentation

## 2012-03-04 DIAGNOSIS — Z862 Personal history of diseases of the blood and blood-forming organs and certain disorders involving the immune mechanism: Secondary | ICD-10-CM | POA: Diagnosis not present

## 2012-03-04 DIAGNOSIS — E875 Hyperkalemia: Secondary | ICD-10-CM

## 2012-03-04 DIAGNOSIS — E876 Hypokalemia: Secondary | ICD-10-CM | POA: Insufficient documentation

## 2012-03-04 DIAGNOSIS — I059 Rheumatic mitral valve disease, unspecified: Secondary | ICD-10-CM | POA: Insufficient documentation

## 2012-03-04 DIAGNOSIS — Z79899 Other long term (current) drug therapy: Secondary | ICD-10-CM | POA: Insufficient documentation

## 2012-03-04 DIAGNOSIS — N186 End stage renal disease: Secondary | ICD-10-CM | POA: Insufficient documentation

## 2012-03-04 DIAGNOSIS — Z8673 Personal history of transient ischemic attack (TIA), and cerebral infarction without residual deficits: Secondary | ICD-10-CM | POA: Insufficient documentation

## 2012-03-04 DIAGNOSIS — Z8679 Personal history of other diseases of the circulatory system: Secondary | ICD-10-CM | POA: Insufficient documentation

## 2012-03-04 DIAGNOSIS — Z9889 Other specified postprocedural states: Secondary | ICD-10-CM | POA: Diagnosis not present

## 2012-03-04 LAB — CBC WITH DIFFERENTIAL/PLATELET
Basophils Absolute: 0 10*3/uL (ref 0.0–0.1)
Basophils Relative: 1 % (ref 0–1)
Lymphocytes Relative: 18 % (ref 12–46)
MCHC: 31.9 g/dL (ref 30.0–36.0)
Monocytes Absolute: 0.6 10*3/uL (ref 0.1–1.0)
Neutro Abs: 4.2 10*3/uL (ref 1.7–7.7)
Platelets: 120 10*3/uL — ABNORMAL LOW (ref 150–400)
RDW: 15.2 % (ref 11.5–15.5)
WBC: 6.5 10*3/uL (ref 4.0–10.5)

## 2012-03-04 LAB — TROPONIN I: Troponin I: <0.3 ng/mL (ref <0.30)

## 2012-03-04 LAB — COMPREHENSIVE METABOLIC PANEL
AST: 18 U/L (ref 0–37)
Albumin: 3.8 g/dL (ref 3.5–5.2)
Alkaline Phosphatase: 77 U/L (ref 39–117)
BUN: 58 mg/dL — ABNORMAL HIGH (ref 6–23)
Chloride: 97 mEq/L (ref 96–112)
Creatinine, Ser: 12.2 mg/dL — ABNORMAL HIGH (ref 0.50–1.35)
Potassium: 6.3 mEq/L (ref 3.5–5.1)
Total Bilirubin: 0.4 mg/dL (ref 0.3–1.2)
Total Protein: 8.3 g/dL (ref 6.0–8.3)

## 2012-03-04 LAB — PRO B NATRIURETIC PEPTIDE: Pro B Natriuretic peptide (BNP): >70000 pg/mL — ABNORMAL HIGH (ref 0–125)

## 2012-03-04 MED ORDER — SODIUM POLYSTYRENE SULFONATE 15 GM/60ML PO SUSP
30.0000 g | Freq: Once | ORAL | Status: AC
  Administered 2012-03-04: 30 g via ORAL
  Filled 2012-03-04: qty 120

## 2012-03-04 NOTE — ED Provider Notes (Addendum)
History     CSN: [id number]  Arrival date & time 03/04/12  0028   First MD Initiated Contact with Patient 03/04/12 0122      Chief Complaint  Patient presents with  . Shortness of Breath    (Consider location/radiation/quality/duration/timing/severity/associated sxs/prior treatment) Patient is a 52 y.o. male presenting with shortness of breath. The history is provided by the patient.  Shortness of Breath Severity:  Moderate Onset quality:  Gradual Duration:  2 days Timing:  Constant Progression:  Unchanged Chronicity:  Recurrent Context comment:  Laying flat Relieved by:  Nothing Exacerbated by: laying flat. Ineffective treatments:  None tried Associated symptoms: no chest pain and no fever   Associated symptoms comment:  Weight has been up and told he needed additional dialysis   Past Medical History  Diagnosis Date  . Hypertension   . Renal insufficiency   . Anemia   . Stroke   . Hemiparesis due to old stroke     left  . LV dysfunction     EF 25-30% by echo 07/2011  . MR (mitral regurgitation)     moderate to severe, echo 07/2011  . Shortness of breath     Past Surgical History  Procedure Laterality Date  . Kidney transplant      2011 rejected kidney 2012 back on dialysis  . Av fistula placement    . Cardiac catheterization      [place] without cardioversion  08/17/2011    Procedure: TRANSESOPHAGEAL ECHOCARDIOGRAM (TEE);  Surgeon: Larey Dresser, MD;  Location: [facility];  Service: Cardiovascular;  Laterality: N/A;    No family history on file.  History  Substance Use Topics  . Smoking status: Current Every Day Smoker -- 0.50 packs/day    Types: Cigarettes  . Smokeless tobacco: Not on file  . Alcohol Use: No      Review of Systems  Constitutional: Negative for fever.  Respiratory: Positive for shortness of breath.   Cardiovascular: Negative for chest pain.  All other systems reviewed and are negative.    Allergies   Codeine  Home Medications   Current Outpatient Rx  Name  Route  Sig  Dispense  Refill  . amLODipine (NORVASC) 10 MG tablet   Oral   Take 1 tablet (10 mg total) by mouth at bedtime.   30 tablet   0   . calcium carbonate (TUMS - DOSED IN MG ELEMENTAL CALCIUM) 500 MG chewable tablet   Oral   Chew 2 tablets (400 mg of elemental calcium total) by mouth 3 (three) times daily with meals.         . cinacalcet (SENSIPAR) 30 MG tablet   Oral   Take 30 mg by mouth daily.         . cloNIDine (CATAPRES - DOSED IN MG/24 HR) 0.3 mg/24hr   Transdermal   Place 1 patch onto the skin once a week. Change on Tuesday         . clopidogrel (PLAVIX) 75 MG tablet   Oral   Take 1 tablet (75 mg total) by mouth daily with breakfast.   30 tablet   5   . guaiFENesin (MUCINEX) 600 MG 12 hr tablet   Oral   Take 1 tablet (600 mg total) by mouth 2 (two) times daily.         Marland Kitchen labetalol (NORMODYNE) 300 MG tablet   Oral   Take 150 mg by mouth 2 (two) times daily.         Marland Kitchen  multivitamin (RENA-VIT) TABS tablet   Oral   Take 1 tablet by mouth at bedtime.   30 tablet   0     BP 178/104  Pulse 94  Temp(Src) 98.9 F (37.2 C) (Oral)  Resp 20  Ht 5\' 9"  (1.753 m)  Wt 185 lb (83.915 kg)  BMI 27.31 kg/m2  SpO2 95%  Physical Exam  Constitutional: He is oriented to person, place, and time. He appears well-developed and well-nourished.  HENT:  Head: Normocephalic and atraumatic.  Mouth/Throat: Oropharynx is clear and moist.  Eyes: Conjunctivae are normal. Pupils are equal, round, and reactive to light.  Neck: Normal range of motion. Neck supple.  Cardiovascular: Normal rate, regular rhythm and intact distal pulses.   Pulmonary/Chest: He has rales.  Abdominal: Soft. Bowel sounds are normal. There is no tenderness. There is no rebound and no guarding.  Musculoskeletal: Normal range of motion. He exhibits no edema.  Neurological: He is alert and oriented to person, place, and time. He has  normal reflexes.  Skin: Skin is warm and dry. He is not diaphoretic.  Psychiatric: He has a normal mood and affect.    ED Course  Procedures (including critical care time)  Labs Reviewed  CBC WITH DIFFERENTIAL - Abnormal; Notable for the following:    RBC 3.48 (*)    Hemoglobin 10.8 (*)    HCT 33.9 (*)    Platelets 120 (*)    Eosinophils Relative 8 (*)    All other components within normal limits  PRO B NATRIURETIC PEPTIDE - Abnormal; Notable for the following:    Pro B Natriuretic peptide (BNP) >70000.0 (*)    All other components within normal limits  COMPREHENSIVE METABOLIC PANEL - Abnormal; Notable for the following:    Potassium 6.3 (*)    BUN 58 (*)    Creatinine, Ser 12.20 (*)    GFR calc non Af Amer 4 (*)    GFR calc Af Amer 5 (*)    All other components within normal limits  TROPONIN I   Dg Chest 2 View  03/04/2012  *RADIOLOGY REPORT*  Clinical Data: Shortness of breath.  No chest pain.  CHEST - 2 VIEW  Comparison: 10/03/2011  Findings: Volume loss in the right lung with pleural thickening and chronic scarring in the right lower lung.  Changes are stable since previous study and have been present on chest radiographs dating back to 02/05/2011.  No developing consolidation or airspace disease.  The left lung appears clear and expanded.  Normal heart size and pulmonary vascularity.  Tortuous aorta.  No pneumothorax.  IMPRESSION: Stable appearance of right pleural thickening and volume loss with atelectasis and scarring in the right lower lung.   Original Report Authenticated By: Lucienne Capers, M.D.      No diagnosis found.    MDM  In the setting of unchanged EKG and orthopnea x 48 hours and negative troponin ACS is excluded.  Patient denied CP.  NO DOE  320 case d/w Dr. Posey Pronto Kayexalate 30 grams and to be called for dialysis by 6 am.      Date: 03/04/2012  Rate: *104  Rhythm: sinus tachycardia  QRS Axis: normal  Intervals: normal  ST/T Wave abnormalities:  nonspecific ST changes  Conduction Disutrbances:none  Narrative Interpretation: LVH with strain  Old EKG Reviewed: unchanged      Jesiel Garate K Griselle Rufer-Rasch, MD 03/04/12 [id number]  Narya Beavin K Kahlie Deutscher-Rasch, MD 03/04/12 0340  Birda Didonato K Hao Dion-Rasch, MD 03/04/12 [phone]

## 2012-03-04 NOTE — ED Notes (Signed)
Patient transported to X-ray 

## 2012-03-04 NOTE — ED Notes (Addendum)
SOB started while watching TV last evening. Last dialysis 2 days ago (Saturday) Next dialysis today (Tuesday). Productive cough x3 days. Pt sts that they have been planning on doing a couple extra dialysis days because he has been retaining some fluid lately but they have not done it yet.

## 2012-03-04 NOTE — ED Notes (Signed)
MD at bedside. 

## 2012-03-08 DIAGNOSIS — D631 Anemia in chronic kidney disease: Secondary | ICD-10-CM | POA: Diagnosis not present

## 2012-03-08 DIAGNOSIS — N186 End stage renal disease: Secondary | ICD-10-CM | POA: Diagnosis not present

## 2012-03-08 DIAGNOSIS — D509 Iron deficiency anemia, unspecified: Secondary | ICD-10-CM | POA: Diagnosis not present

## 2012-03-08 DIAGNOSIS — E8779 Other fluid overload: Secondary | ICD-10-CM | POA: Diagnosis not present

## 2012-03-08 DIAGNOSIS — N2581 Secondary hyperparathyroidism of renal origin: Secondary | ICD-10-CM | POA: Diagnosis not present

## 2012-04-07 DIAGNOSIS — N186 End stage renal disease: Secondary | ICD-10-CM | POA: Diagnosis not present

## 2012-04-08 DIAGNOSIS — N2581 Secondary hyperparathyroidism of renal origin: Secondary | ICD-10-CM | POA: Diagnosis not present

## 2012-04-08 DIAGNOSIS — D631 Anemia in chronic kidney disease: Secondary | ICD-10-CM | POA: Diagnosis not present

## 2012-04-08 DIAGNOSIS — N186 End stage renal disease: Secondary | ICD-10-CM | POA: Diagnosis not present

## 2012-04-12 ENCOUNTER — Encounter (HOSPITAL_BASED_OUTPATIENT_CLINIC_OR_DEPARTMENT_OTHER): Payer: Self-pay | Admitting: *Deleted

## 2012-04-12 ENCOUNTER — Emergency Department (HOSPITAL_BASED_OUTPATIENT_CLINIC_OR_DEPARTMENT_OTHER)
Admission: EM | Admit: 2012-04-12 | Discharge: 2012-04-12 | Disposition: A | Discharge status: Home / Self Care | Payer: Medicare Other | Attending: Emergency Medicine | Admitting: Emergency Medicine

## 2012-04-12 DIAGNOSIS — Z87448 Personal history of other diseases of urinary system: Secondary | ICD-10-CM | POA: Diagnosis not present

## 2012-04-12 DIAGNOSIS — I120 Hypertensive chronic kidney disease with stage 5 chronic kidney disease or end stage renal disease: Secondary | ICD-10-CM | POA: Insufficient documentation

## 2012-04-12 DIAGNOSIS — Z862 Personal history of diseases of the blood and blood-forming organs and certain disorders involving the immune mechanism: Secondary | ICD-10-CM | POA: Insufficient documentation

## 2012-04-12 DIAGNOSIS — Z940 Kidney transplant status: Secondary | ICD-10-CM | POA: Insufficient documentation

## 2012-04-12 DIAGNOSIS — Z8673 Personal history of transient ischemic attack (TIA), and cerebral infarction without residual deficits: Secondary | ICD-10-CM | POA: Diagnosis not present

## 2012-04-12 DIAGNOSIS — L299 Pruritus, unspecified: Secondary | ICD-10-CM | POA: Insufficient documentation

## 2012-04-12 DIAGNOSIS — Z8679 Personal history of other diseases of the circulatory system: Secondary | ICD-10-CM | POA: Diagnosis not present

## 2012-04-12 DIAGNOSIS — Z8669 Personal history of other diseases of the nervous system and sense organs: Secondary | ICD-10-CM | POA: Diagnosis not present

## 2012-04-12 DIAGNOSIS — F17200 Nicotine dependence, unspecified, uncomplicated: Secondary | ICD-10-CM | POA: Diagnosis not present

## 2012-04-12 DIAGNOSIS — E119 Type 2 diabetes mellitus without complications: Secondary | ICD-10-CM | POA: Diagnosis not present

## 2012-04-12 DIAGNOSIS — Z8709 Personal history of other diseases of the respiratory system: Secondary | ICD-10-CM | POA: Diagnosis not present

## 2012-04-12 DIAGNOSIS — Z992 Dependence on renal dialysis: Secondary | ICD-10-CM | POA: Insufficient documentation

## 2012-04-12 DIAGNOSIS — B86 Scabies: Secondary | ICD-10-CM

## 2012-04-12 DIAGNOSIS — Z79899 Other long term (current) drug therapy: Secondary | ICD-10-CM | POA: Diagnosis not present

## 2012-04-12 DIAGNOSIS — N186 End stage renal disease: Secondary | ICD-10-CM | POA: Insufficient documentation

## 2012-04-12 HISTORY — DX: Type 2 diabetes mellitus without complications: E11.9

## 2012-04-12 MED ORDER — PERMETHRIN 5 % EX CREA: TOPICAL_CREAM | CUTANEOUS | Status: DC

## 2012-04-12 NOTE — ED Notes (Signed)
Small red spots to left forearm x 2 days. Pt states that he has been around people dx'd with scabies.

## 2012-04-12 NOTE — ED Provider Notes (Signed)
History     CSN: [id number]  Arrival date & time 04/12/12  0627   First MD Initiated Contact with Patient 04/12/12 [phone]      Chief Complaint  Patient presents with  . Rash    (Consider location/radiation/quality/duration/timing/severity/associated sxs/prior treatment) HPI This is a 52 year old male with end-stage renal disease on hemodialysis. He found out yesterday evening that some intimate friends were diagnosed with scabies. He noticed some itchy small papules on his left forearm this morning and is concerned he may have scabies. The symptoms are mild. He denies systemic symptoms.  Past Medical History  Diagnosis Date  . Hypertension   . Renal insufficiency   . Anemia   . Stroke   . Hemiparesis due to old stroke     left  . LV dysfunction     EF 25-30% by echo 07/2011  . MR (mitral regurgitation)     moderate to severe, echo 07/2011  . Shortness of breath   . Diabetes mellitus without complication     Past Surgical History  Procedure Laterality Date  . Kidney transplant      2011 rejected kidney 2012 back on dialysis  . Av fistula placement    . Cardiac catheterization      [place] without cardioversion  08/17/2011    Procedure: TRANSESOPHAGEAL ECHOCARDIOGRAM (TEE);  Surgeon: Larey Dresser, MD;  Location: [facility];  Service: Cardiovascular;  Laterality: N/A;    History reviewed. No pertinent family history.  History  Substance Use Topics  . Smoking status: Current Every Day Smoker -- 0.50 packs/day    Types: Cigarettes  . Smokeless tobacco: Not on file  . Alcohol Use: No      Review of Systems  All other systems reviewed and are negative.    Allergies  Codeine  Home Medications   Current Outpatient Rx  Name  Route  Sig  Dispense  Refill  . amLODipine (NORVASC) 10 MG tablet   Oral   Take 1 tablet (10 mg total) by mouth at bedtime.   30 tablet   0   . cinacalcet (SENSIPAR) 30 MG tablet   Oral   Take 30 mg by mouth  daily.         . cloNIDine (CATAPRES - DOSED IN MG/24 HR) 0.3 mg/24hr   Transdermal   Place 1 patch onto the skin once a week. Change on Tuesday         . labetalol (NORMODYNE) 300 MG tablet   Oral   Take 150 mg by mouth 2 (two) times daily.         . multivitamin (RENA-VIT) TABS tablet   Oral   Take 1 tablet by mouth at bedtime.   30 tablet   0   . clopidogrel (PLAVIX) 75 MG tablet   Oral   Take 1 tablet (75 mg total) by mouth daily with breakfast.   30 tablet   5   . guaiFENesin (MUCINEX) 600 MG 12 hr tablet   Oral   Take 1 tablet (600 mg total) by mouth 2 (two) times daily.           BP 184/113  Pulse 102  Temp(Src) 98.9 F (37.2 C) (Oral)  Resp 16  Ht 5\' 9"  (1.753 m)  Wt 178 lb 4.8 oz (80.876 kg)  BMI 26.32 kg/m2  SpO2 95%  Physical Exam General: Well-developed, well-nourished male in no acute distress; appearance consistent with age of record HENT: normocephalic, atraumatic Eyes:  Normal appearance Neck: supple Heart: regular rate and rhythm Lungs: Normal respiratory effort and excursion Abdomen: soft; nondistended Extremities: No deformity; full range of motion; pulses normal Neurologic: Awake, alert and oriented; motor function intact in all extremities and symmetric; no facial droop Skin: Warm and dry; several small papules of the left forearm consistent with early scabies Psychiatric: Normal mood and affect    ED Course  Procedures (including critical care time)    MDM          Wynetta Fines, MD 04/12/12 0700

## 2012-05-07 DIAGNOSIS — N186 End stage renal disease: Secondary | ICD-10-CM | POA: Diagnosis not present

## 2012-05-08 DIAGNOSIS — N186 End stage renal disease: Secondary | ICD-10-CM | POA: Diagnosis not present

## 2012-05-08 DIAGNOSIS — D509 Iron deficiency anemia, unspecified: Secondary | ICD-10-CM | POA: Diagnosis not present

## 2012-05-08 DIAGNOSIS — D631 Anemia in chronic kidney disease: Secondary | ICD-10-CM | POA: Diagnosis not present

## 2012-05-08 DIAGNOSIS — N039 Chronic nephritic syndrome with unspecified morphologic changes: Secondary | ICD-10-CM | POA: Diagnosis not present

## 2012-05-08 DIAGNOSIS — N2581 Secondary hyperparathyroidism of renal origin: Secondary | ICD-10-CM | POA: Diagnosis not present

## 2012-05-10 DIAGNOSIS — N186 End stage renal disease: Secondary | ICD-10-CM | POA: Diagnosis not present

## 2012-05-10 DIAGNOSIS — D631 Anemia in chronic kidney disease: Secondary | ICD-10-CM | POA: Diagnosis not present

## 2012-05-10 DIAGNOSIS — N2581 Secondary hyperparathyroidism of renal origin: Secondary | ICD-10-CM | POA: Diagnosis not present

## 2012-05-10 DIAGNOSIS — D509 Iron deficiency anemia, unspecified: Secondary | ICD-10-CM | POA: Diagnosis not present

## 2012-05-13 DIAGNOSIS — D509 Iron deficiency anemia, unspecified: Secondary | ICD-10-CM | POA: Diagnosis not present

## 2012-05-13 DIAGNOSIS — N039 Chronic nephritic syndrome with unspecified morphologic changes: Secondary | ICD-10-CM | POA: Diagnosis not present

## 2012-05-13 DIAGNOSIS — N186 End stage renal disease: Secondary | ICD-10-CM | POA: Diagnosis not present

## 2012-05-13 DIAGNOSIS — N2581 Secondary hyperparathyroidism of renal origin: Secondary | ICD-10-CM | POA: Diagnosis not present

## 2012-05-15 DIAGNOSIS — N186 End stage renal disease: Secondary | ICD-10-CM | POA: Diagnosis not present

## 2012-05-15 DIAGNOSIS — N2581 Secondary hyperparathyroidism of renal origin: Secondary | ICD-10-CM | POA: Diagnosis not present

## 2012-05-15 DIAGNOSIS — D509 Iron deficiency anemia, unspecified: Secondary | ICD-10-CM | POA: Diagnosis not present

## 2012-05-15 DIAGNOSIS — N039 Chronic nephritic syndrome with unspecified morphologic changes: Secondary | ICD-10-CM | POA: Diagnosis not present

## 2012-05-17 DIAGNOSIS — D509 Iron deficiency anemia, unspecified: Secondary | ICD-10-CM | POA: Diagnosis not present

## 2012-05-17 DIAGNOSIS — N2581 Secondary hyperparathyroidism of renal origin: Secondary | ICD-10-CM | POA: Diagnosis not present

## 2012-05-17 DIAGNOSIS — D631 Anemia in chronic kidney disease: Secondary | ICD-10-CM | POA: Diagnosis not present

## 2012-05-17 DIAGNOSIS — N186 End stage renal disease: Secondary | ICD-10-CM | POA: Diagnosis not present

## 2012-05-20 DIAGNOSIS — N2581 Secondary hyperparathyroidism of renal origin: Secondary | ICD-10-CM | POA: Diagnosis not present

## 2012-05-20 DIAGNOSIS — N039 Chronic nephritic syndrome with unspecified morphologic changes: Secondary | ICD-10-CM | POA: Diagnosis not present

## 2012-05-20 DIAGNOSIS — D509 Iron deficiency anemia, unspecified: Secondary | ICD-10-CM | POA: Diagnosis not present

## 2012-05-20 DIAGNOSIS — N186 End stage renal disease: Secondary | ICD-10-CM | POA: Diagnosis not present

## 2012-05-22 DIAGNOSIS — N2581 Secondary hyperparathyroidism of renal origin: Secondary | ICD-10-CM | POA: Diagnosis not present

## 2012-05-22 DIAGNOSIS — D509 Iron deficiency anemia, unspecified: Secondary | ICD-10-CM | POA: Diagnosis not present

## 2012-05-22 DIAGNOSIS — N186 End stage renal disease: Secondary | ICD-10-CM | POA: Diagnosis not present

## 2012-05-22 DIAGNOSIS — D631 Anemia in chronic kidney disease: Secondary | ICD-10-CM | POA: Diagnosis not present

## 2012-05-24 DIAGNOSIS — D509 Iron deficiency anemia, unspecified: Secondary | ICD-10-CM | POA: Diagnosis not present

## 2012-05-24 DIAGNOSIS — N186 End stage renal disease: Secondary | ICD-10-CM | POA: Diagnosis not present

## 2012-05-24 DIAGNOSIS — N2581 Secondary hyperparathyroidism of renal origin: Secondary | ICD-10-CM | POA: Diagnosis not present

## 2012-05-24 DIAGNOSIS — D631 Anemia in chronic kidney disease: Secondary | ICD-10-CM | POA: Diagnosis not present

## 2012-05-27 DIAGNOSIS — N2581 Secondary hyperparathyroidism of renal origin: Secondary | ICD-10-CM | POA: Diagnosis not present

## 2012-05-27 DIAGNOSIS — N186 End stage renal disease: Secondary | ICD-10-CM | POA: Diagnosis not present

## 2012-05-27 DIAGNOSIS — D631 Anemia in chronic kidney disease: Secondary | ICD-10-CM | POA: Diagnosis not present

## 2012-05-27 DIAGNOSIS — D509 Iron deficiency anemia, unspecified: Secondary | ICD-10-CM | POA: Diagnosis not present

## 2012-05-29 DIAGNOSIS — N2581 Secondary hyperparathyroidism of renal origin: Secondary | ICD-10-CM | POA: Diagnosis not present

## 2012-05-29 DIAGNOSIS — N186 End stage renal disease: Secondary | ICD-10-CM | POA: Diagnosis not present

## 2012-05-29 DIAGNOSIS — D509 Iron deficiency anemia, unspecified: Secondary | ICD-10-CM | POA: Diagnosis not present

## 2012-05-29 DIAGNOSIS — D631 Anemia in chronic kidney disease: Secondary | ICD-10-CM | POA: Diagnosis not present

## 2012-05-31 DIAGNOSIS — N2581 Secondary hyperparathyroidism of renal origin: Secondary | ICD-10-CM | POA: Diagnosis not present

## 2012-05-31 DIAGNOSIS — N039 Chronic nephritic syndrome with unspecified morphologic changes: Secondary | ICD-10-CM | POA: Diagnosis not present

## 2012-05-31 DIAGNOSIS — N186 End stage renal disease: Secondary | ICD-10-CM | POA: Diagnosis not present

## 2012-05-31 DIAGNOSIS — D509 Iron deficiency anemia, unspecified: Secondary | ICD-10-CM | POA: Diagnosis not present

## 2012-06-03 DIAGNOSIS — N039 Chronic nephritic syndrome with unspecified morphologic changes: Secondary | ICD-10-CM | POA: Diagnosis not present

## 2012-06-03 DIAGNOSIS — D509 Iron deficiency anemia, unspecified: Secondary | ICD-10-CM | POA: Diagnosis not present

## 2012-06-03 DIAGNOSIS — N186 End stage renal disease: Secondary | ICD-10-CM | POA: Diagnosis not present

## 2012-06-03 DIAGNOSIS — N2581 Secondary hyperparathyroidism of renal origin: Secondary | ICD-10-CM | POA: Diagnosis not present

## 2012-06-05 DIAGNOSIS — N039 Chronic nephritic syndrome with unspecified morphologic changes: Secondary | ICD-10-CM | POA: Diagnosis not present

## 2012-06-05 DIAGNOSIS — N2581 Secondary hyperparathyroidism of renal origin: Secondary | ICD-10-CM | POA: Diagnosis not present

## 2012-06-05 DIAGNOSIS — N186 End stage renal disease: Secondary | ICD-10-CM | POA: Diagnosis not present

## 2012-06-05 DIAGNOSIS — D509 Iron deficiency anemia, unspecified: Secondary | ICD-10-CM | POA: Diagnosis not present

## 2012-06-07 DIAGNOSIS — D631 Anemia in chronic kidney disease: Secondary | ICD-10-CM | POA: Diagnosis not present

## 2012-06-07 DIAGNOSIS — D509 Iron deficiency anemia, unspecified: Secondary | ICD-10-CM | POA: Diagnosis not present

## 2012-06-07 DIAGNOSIS — N186 End stage renal disease: Secondary | ICD-10-CM | POA: Diagnosis not present

## 2012-06-07 DIAGNOSIS — N2581 Secondary hyperparathyroidism of renal origin: Secondary | ICD-10-CM | POA: Diagnosis not present

## 2012-06-10 ENCOUNTER — Emergency Department (HOSPITAL_BASED_OUTPATIENT_CLINIC_OR_DEPARTMENT_OTHER): Payer: Medicare Other

## 2012-06-10 ENCOUNTER — Emergency Department (HOSPITAL_BASED_OUTPATIENT_CLINIC_OR_DEPARTMENT_OTHER)
Admission: EM | Admit: 2012-06-10 | Discharge: 2012-06-10 | Disposition: A | Discharge status: Home / Self Care | Payer: Medicare Other | Attending: Emergency Medicine | Admitting: Emergency Medicine

## 2012-06-10 ENCOUNTER — Encounter (HOSPITAL_BASED_OUTPATIENT_CLINIC_OR_DEPARTMENT_OTHER): Payer: Self-pay | Admitting: Emergency Medicine

## 2012-06-10 ENCOUNTER — Other Ambulatory Visit: Payer: Self-pay

## 2012-06-10 DIAGNOSIS — R011 Cardiac murmur, unspecified: Secondary | ICD-10-CM | POA: Diagnosis not present

## 2012-06-10 DIAGNOSIS — J90 Pleural effusion, not elsewhere classified: Secondary | ICD-10-CM | POA: Diagnosis not present

## 2012-06-10 DIAGNOSIS — E119 Type 2 diabetes mellitus without complications: Secondary | ICD-10-CM | POA: Diagnosis not present

## 2012-06-10 DIAGNOSIS — Z7902 Long term (current) use of antithrombotics/antiplatelets: Secondary | ICD-10-CM | POA: Insufficient documentation

## 2012-06-10 DIAGNOSIS — R05 Cough: Secondary | ICD-10-CM | POA: Insufficient documentation

## 2012-06-10 DIAGNOSIS — R609 Edema, unspecified: Secondary | ICD-10-CM | POA: Insufficient documentation

## 2012-06-10 DIAGNOSIS — Z9861 Coronary angioplasty status: Secondary | ICD-10-CM | POA: Diagnosis not present

## 2012-06-10 DIAGNOSIS — Z8669 Personal history of other diseases of the nervous system and sense organs: Secondary | ICD-10-CM | POA: Insufficient documentation

## 2012-06-10 DIAGNOSIS — Z992 Dependence on renal dialysis: Secondary | ICD-10-CM | POA: Insufficient documentation

## 2012-06-10 DIAGNOSIS — Z8673 Personal history of transient ischemic attack (TIA), and cerebral infarction without residual deficits: Secondary | ICD-10-CM | POA: Insufficient documentation

## 2012-06-10 DIAGNOSIS — R059 Cough, unspecified: Secondary | ICD-10-CM | POA: Insufficient documentation

## 2012-06-10 DIAGNOSIS — R5383 Other fatigue: Secondary | ICD-10-CM | POA: Insufficient documentation

## 2012-06-10 DIAGNOSIS — D649 Anemia, unspecified: Secondary | ICD-10-CM | POA: Insufficient documentation

## 2012-06-10 DIAGNOSIS — Z8679 Personal history of other diseases of the circulatory system: Secondary | ICD-10-CM | POA: Diagnosis not present

## 2012-06-10 DIAGNOSIS — Z940 Kidney transplant status: Secondary | ICD-10-CM | POA: Insufficient documentation

## 2012-06-10 DIAGNOSIS — R0602 Shortness of breath: Secondary | ICD-10-CM | POA: Insufficient documentation

## 2012-06-10 DIAGNOSIS — R61 Generalized hyperhidrosis: Secondary | ICD-10-CM | POA: Diagnosis not present

## 2012-06-10 DIAGNOSIS — D509 Iron deficiency anemia, unspecified: Secondary | ICD-10-CM | POA: Diagnosis not present

## 2012-06-10 DIAGNOSIS — Z79899 Other long term (current) drug therapy: Secondary | ICD-10-CM | POA: Insufficient documentation

## 2012-06-10 DIAGNOSIS — N039 Chronic nephritic syndrome with unspecified morphologic changes: Secondary | ICD-10-CM | POA: Diagnosis not present

## 2012-06-10 DIAGNOSIS — F17200 Nicotine dependence, unspecified, uncomplicated: Secondary | ICD-10-CM | POA: Insufficient documentation

## 2012-06-10 DIAGNOSIS — I120 Hypertensive chronic kidney disease with stage 5 chronic kidney disease or end stage renal disease: Secondary | ICD-10-CM | POA: Insufficient documentation

## 2012-06-10 DIAGNOSIS — N186 End stage renal disease: Secondary | ICD-10-CM | POA: Diagnosis not present

## 2012-06-10 DIAGNOSIS — R5381 Other malaise: Secondary | ICD-10-CM | POA: Diagnosis not present

## 2012-06-10 DIAGNOSIS — N2581 Secondary hyperparathyroidism of renal origin: Secondary | ICD-10-CM | POA: Diagnosis not present

## 2012-06-10 LAB — COMPREHENSIVE METABOLIC PANEL
AST: 15 U/L (ref 0–37)
BUN: 72 mg/dL — ABNORMAL HIGH (ref 6–23)
CO2: 24 mEq/L (ref 19–32)
Calcium: 10.5 mg/dL (ref 8.4–10.5)
Chloride: 96 mEq/L (ref 96–112)
Creatinine, Ser: 14.1 mg/dL — ABNORMAL HIGH (ref 0.50–1.35)
GFR calc Af Amer: 4 mL/min — ABNORMAL LOW (ref >90)
GFR calc non Af Amer: 4 mL/min — ABNORMAL LOW (ref >90)
Glucose, Bld: 95 mg/dL (ref 70–99)
Total Bilirubin: 0.4 mg/dL (ref 0.3–1.2)

## 2012-06-10 LAB — CBC WITH DIFFERENTIAL/PLATELET
Eosinophils Relative: 9 % — ABNORMAL HIGH (ref 0–5)
HCT: 31.6 % — ABNORMAL LOW (ref 39.0–52.0)
Hemoglobin: 10.3 g/dL — ABNORMAL LOW (ref 13.0–17.0)
Lymphocytes Relative: 18 % (ref 12–46)
MCV: 95.2 fL (ref 78.0–100.0)
Monocytes Absolute: 0.6 10*3/uL (ref 0.1–1.0)
Monocytes Relative: 10 % (ref 3–12)
Neutro Abs: 3.8 10*3/uL (ref 1.7–7.7)
WBC: 6 10*3/uL (ref 4.0–10.5)

## 2012-06-10 LAB — TROPONIN I: Troponin I: <0.3 ng/mL (ref <0.30)

## 2012-06-10 LAB — PRO B NATRIURETIC PEPTIDE: Pro B Natriuretic peptide (BNP): >70000 pg/mL — ABNORMAL HIGH (ref 0–125)

## 2012-06-10 MED ORDER — FUROSEMIDE 10 MG/ML IJ SOLN
40.0000 mg | Freq: Once | INTRAMUSCULAR | Status: AC
  Administered 2012-06-10: 40 mg via INTRAVENOUS
  Filled 2012-06-10: qty 4

## 2012-06-10 NOTE — ED Notes (Signed)
MD at bedside. 

## 2012-06-10 NOTE — ED Notes (Signed)
Patient transported to X-ray 

## 2012-06-10 NOTE — ED Notes (Signed)
SOB and cough that keeps him awake at night x2 months.  Worse recently.

## 2012-06-10 NOTE — ED Provider Notes (Signed)
History     CSN: [id number]  Arrival date & time 06/10/12  0153   First MD Initiated Contact with Patient 06/10/12 0208      Chief Complaint  Patient presents with  . Shortness of Breath  . Cough    (Consider location/radiation/quality/duration/timing/severity/associated sxs/prior treatment) HPI Comments: Patient with a history of hypertension end-stage renal disease, and diabetes mellitus presents with shortness of breath. He states he's had intermittent shortness of breath over the last 2 or 3 months. He states he feels like a shortness of breath has worsened since yesterday. He states every time he tries to lay down and go to sleep he starts getting a worsening cough and shortness of breath. His cough is productive of clear sputum. He's also been more fatigued than normal. He denies any chest pain or tightness. He denies any pedal edema. He denies any fevers or chills. He denies any nausea vomiting or diarrhea. He last had dialysis on Saturday. He's due for dialysis again tomorrow.  Patient is a 52 y.o. male presenting with shortness of breath and cough.  Shortness of Breath Associated symptoms: cough and diaphoresis   Associated symptoms: no abdominal pain, no chest pain, no fever, no headaches, no rash and no vomiting   Cough Associated symptoms: diaphoresis and shortness of breath   Associated symptoms: no chest pain, no chills, no fever, no headaches, no rash and no rhinorrhea     Past Medical History  Diagnosis Date  . Hypertension   . Renal insufficiency   . Anemia   . Stroke   . Hemiparesis due to old stroke     left  . LV dysfunction     EF 25-30% by echo 07/2011  . MR (mitral regurgitation)     moderate to severe, echo 07/2011  . Shortness of breath   . Diabetes mellitus without complication     Past Surgical History  Procedure Laterality Date  . Kidney transplant      2011 rejected kidney 2012 back on dialysis  . Av fistula placement    . Cardiac  catheterization      [place] without cardioversion  08/17/2011    Procedure: TRANSESOPHAGEAL ECHOCARDIOGRAM (TEE);  Surgeon: Larey Dresser, MD;  Location: [facility];  Service: Cardiovascular;  Laterality: N/A;    No family history on file.  History  Substance Use Topics  . Smoking status: Current Every Day Smoker -- 0.50 packs/day    Types: Cigarettes  . Smokeless tobacco: Not on file  . Alcohol Use: No      Review of Systems  Constitutional: Positive for diaphoresis and fatigue. Negative for fever and chills.  HENT: Negative for congestion, rhinorrhea and sneezing.   Eyes: Negative.   Respiratory: Positive for cough and shortness of breath. Negative for chest tightness.   Cardiovascular: Negative for chest pain and leg swelling.  Gastrointestinal: Negative for nausea, vomiting, abdominal pain, diarrhea and blood in stool.  Genitourinary: Negative for frequency, hematuria, flank pain and difficulty urinating.  Musculoskeletal: Negative for back pain and arthralgias.  Skin: Negative for rash.  Neurological: Negative for dizziness, speech difficulty, weakness, numbness and headaches.  All other systems reviewed and are negative.    Allergies  Codeine  Home Medications   Current Outpatient Rx  Name  Route  Sig  Dispense  Refill  . EXPIRED: amLODipine (NORVASC) 10 MG tablet   Oral   Take 1 tablet (10 mg total) by mouth at bedtime.   [place]  tablet   0   . cinacalcet (SENSIPAR) 30 MG tablet   Oral   Take 30 mg by mouth daily.         . cloNIDine (CATAPRES - DOSED IN MG/24 HR) 0.3 mg/24hr   Transdermal   Place 1 patch onto the skin once a week. Change on Tuesday         . clopidogrel (PLAVIX) 75 MG tablet   Oral   Take 1 tablet (75 mg total) by mouth daily with breakfast.   30 tablet   5   . guaiFENesin (MUCINEX) 600 MG 12 hr tablet   Oral   Take 1 tablet (600 mg total) by mouth 2 (two) times daily.         Marland Kitchen labetalol (NORMODYNE) 300 MG  tablet   Oral   Take 150 mg by mouth 2 (two) times daily.         . multivitamin (RENA-VIT) TABS tablet   Oral   Take 1 tablet by mouth at bedtime.   30 tablet   0   . permethrin (ELIMITE) 5 % cream      Apply to entire body from neck down and leave on for 8-14 hours. Repeat in one week.   60 g   1     BP 175/95  Pulse 94  Temp(Src) 98.4 F (36.9 C) (Oral)  Resp 24  Ht 5\' 9"  (1.753 m)  Wt 180 lb (81.647 kg)  BMI 26.57 kg/m2  SpO2 96%  Physical Exam  Constitutional: He is oriented to person, place, and time. He appears well-developed and well-nourished.  HENT:  Head: Normocephalic and atraumatic.  Eyes: Pupils are equal, round, and reactive to light.  Neck: Normal range of motion. Neck supple.  Cardiovascular: Normal rate and regular rhythm.   Murmur heard. Pulmonary/Chest: Effort normal. No respiratory distress. He has no wheezes. He has rales (few crackles and expiratory wheezes in the bases bilaterally.). He exhibits no tenderness.  Abdominal: Soft. Bowel sounds are normal. There is no tenderness. There is no rebound and no guarding.  Musculoskeletal: Normal range of motion. He exhibits edema.  Trace edema bilaterally. No calf tenderness  Lymphadenopathy:    He has no cervical adenopathy.  Neurological: He is alert and oriented to person, place, and time.  Skin: Skin is warm and dry. No rash noted.  Psychiatric: He has a normal mood and affect.    ED Course  Procedures (including critical care time)  Results for orders placed during the hospital encounter of 06/10/12  CBC WITH DIFFERENTIAL      Result Value Range   WBC 6.0  4.0 - 10.5 K/uL   RBC 3.32 (*) 4.22 - 5.81 MIL/uL   Hemoglobin 10.3 (*) 13.0 - 17.0 g/dL   HCT 31.6 (*) 39.0 - 52.0 %   MCV 95.2  78.0 - 100.0 fL   MCH 31.0  26.0 - 34.0 pg   MCHC 32.6  30.0 - 36.0 g/dL   RDW 15.9 (*) 11.5 - 15.5 %   Platelets 142 (*) 150 - 400 K/uL   Neutrophils Relative % 63  43 - 77 %   Neutro Abs 3.8  1.7 -  7.7 K/uL   Lymphocytes Relative 18  12 - 46 %   Lymphs Abs 1.1  0.7 - 4.0 K/uL   Monocytes Relative 10  3 - 12 %   Monocytes Absolute 0.6  0.1 - 1.0 K/uL   Eosinophils Relative 9 (*) 0 - 5 %  Eosinophils Absolute 0.6  0.0 - 0.7 K/uL   Basophils Relative 1  0 - 1 %   Basophils Absolute 0.1  0.0 - 0.1 K/uL  COMPREHENSIVE METABOLIC PANEL      Result Value Range   Sodium 140  135 - 145 mEq/L   Potassium 4.5  3.5 - 5.1 mEq/L   Chloride 96  96 - 112 mEq/L   CO2 24  19 - 32 mEq/L   Glucose, Bld 95  70 - 99 mg/dL   BUN 72 (*) 6 - 23 mg/dL   Creatinine, Ser 14.10 (*) 0.50 - 1.35 mg/dL   Calcium 10.5  8.4 - 10.5 mg/dL   Total Protein 7.5  6.0 - 8.3 g/dL   Albumin 3.4 (*) 3.5 - 5.2 g/dL   AST 15  0 - 37 U/L   ALT 6  0 - 53 U/L   Alkaline Phosphatase 59  39 - 117 U/L   Total Bilirubin 0.4  0.3 - 1.2 mg/dL   GFR calc non Af Amer 4 (*) >90 mL/min   GFR calc Af Amer 4 (*) >90 mL/min  TROPONIN I      Result Value Range   Troponin I <0.30  <0.30 ng/mL  PRO B NATRIURETIC PEPTIDE      Result Value Range   Pro B Natriuretic peptide (BNP) >70000.0 (*) 0 - 125 pg/mL   Dg Chest 2 View  06/10/2012   *RADIOLOGY REPORT*  Clinical Data: Shortness of breath.  CHEST - 2 VIEW  Comparison: Multiple priors, most recently chest x-ray 03/04/2012.  Findings: Small chronic right-sided pleural effusion is similar to the prior examination.  There is again architectural distortion and chronic opacities throughout the right mid to lower hemithorax, similar to prior studies, which appears to correspond with areas of chronic scarring and probable rounded atelectasis in the right lower lobe, based on comparison with prior chest CT 07/23/2011. Left lung appears clear.  No evidence of pulmonary edema.  Heart size appears mildly enlarged (unchanged).  Upper mediastinal contours are within normal limits.  Atherosclerosis in the thoracic aorta.  IMPRESSION: 1.  No significant change compared to the prior examination.  The  radiographic appearance of the chest is very similar, as above, without evidence of acute cardiopulmonary disease.   Original Report Authenticated By: Vinnie Langton, M.D.      Date: 06/10/2012  Rate: 91  Rhythm: normal sinus rhythm  QRS Axis: normal  Intervals: normal  ST/T Wave abnormalities: nonspecific ST/T changes  Conduction Disutrbances:none  Narrative Interpretation:   Old EKG Reviewed: unchanged   1. Shortness of breath       MDM  Patient's chest x-ray did not reveal any evidence of pneumonia or overt pulmonary edema. His BNP is markedly elevated but it appears to have been chronically elevated over the last year. This was noted on review of his records. He had a recent echocardiogram in August of 2013 which showed it diminished ejection fraction of 30-35%. He was given one dose of Lasix and placed on supplemental oxygen for comfort. His oxygen saturations have remained normal on room air. He does say that he urinates some regularly. His EKG did not show any acute ischemic changes and his troponin is normal. Given the duration of his symptoms I do not feel that this represents acute coronary syndrome. He has no other symptoms suggestive of pulmonary embolus. He currently is feeling better and is ready to go home. He has dialysis later this morning. He was able  to ambulate throughout the ED with oxygen saturations ranging between 80 and 92% on room air. I rechecked his oxygen level and it fluctuates between 90 and 95%. I discussed options with the patient and keep him overnight however patient feels like he's feeling better and wants to go home. I feel that the most likely etiology of his symptoms is some mild fluid overload without overt pulmonary edema on x-ray. I feel he can be discharged home with dialysis later this morning. I advised him return if he feels that his symptoms are worsening. He's had no shortness of breath intermittently over the last couple months and feels like he  needs to be on home oxygen. I advised him to talk to his primary care physician regarding this.       Malvin Johns, MD 06/10/12 [phone]

## 2012-06-12 DIAGNOSIS — N186 End stage renal disease: Secondary | ICD-10-CM | POA: Diagnosis not present

## 2012-06-12 DIAGNOSIS — N2581 Secondary hyperparathyroidism of renal origin: Secondary | ICD-10-CM | POA: Diagnosis not present

## 2012-06-12 DIAGNOSIS — D509 Iron deficiency anemia, unspecified: Secondary | ICD-10-CM | POA: Diagnosis not present

## 2012-06-12 DIAGNOSIS — N039 Chronic nephritic syndrome with unspecified morphologic changes: Secondary | ICD-10-CM | POA: Diagnosis not present

## 2012-06-14 DIAGNOSIS — N186 End stage renal disease: Secondary | ICD-10-CM | POA: Diagnosis not present

## 2012-06-14 DIAGNOSIS — D631 Anemia in chronic kidney disease: Secondary | ICD-10-CM | POA: Diagnosis not present

## 2012-06-14 DIAGNOSIS — D509 Iron deficiency anemia, unspecified: Secondary | ICD-10-CM | POA: Diagnosis not present

## 2012-06-14 DIAGNOSIS — N2581 Secondary hyperparathyroidism of renal origin: Secondary | ICD-10-CM | POA: Diagnosis not present

## 2012-06-17 DIAGNOSIS — D631 Anemia in chronic kidney disease: Secondary | ICD-10-CM | POA: Diagnosis not present

## 2012-06-17 DIAGNOSIS — D509 Iron deficiency anemia, unspecified: Secondary | ICD-10-CM | POA: Diagnosis not present

## 2012-06-17 DIAGNOSIS — N2581 Secondary hyperparathyroidism of renal origin: Secondary | ICD-10-CM | POA: Diagnosis not present

## 2012-06-17 DIAGNOSIS — N186 End stage renal disease: Secondary | ICD-10-CM | POA: Diagnosis not present

## 2012-06-19 DIAGNOSIS — D631 Anemia in chronic kidney disease: Secondary | ICD-10-CM | POA: Diagnosis not present

## 2012-06-19 DIAGNOSIS — N2581 Secondary hyperparathyroidism of renal origin: Secondary | ICD-10-CM | POA: Diagnosis not present

## 2012-06-19 DIAGNOSIS — N186 End stage renal disease: Secondary | ICD-10-CM | POA: Diagnosis not present

## 2012-06-19 DIAGNOSIS — D509 Iron deficiency anemia, unspecified: Secondary | ICD-10-CM | POA: Diagnosis not present

## 2012-06-21 DIAGNOSIS — N039 Chronic nephritic syndrome with unspecified morphologic changes: Secondary | ICD-10-CM | POA: Diagnosis not present

## 2012-06-21 DIAGNOSIS — N2581 Secondary hyperparathyroidism of renal origin: Secondary | ICD-10-CM | POA: Diagnosis not present

## 2012-06-21 DIAGNOSIS — N186 End stage renal disease: Secondary | ICD-10-CM | POA: Diagnosis not present

## 2012-06-21 DIAGNOSIS — D509 Iron deficiency anemia, unspecified: Secondary | ICD-10-CM | POA: Diagnosis not present

## 2012-06-24 DIAGNOSIS — D509 Iron deficiency anemia, unspecified: Secondary | ICD-10-CM | POA: Diagnosis not present

## 2012-06-24 DIAGNOSIS — N2581 Secondary hyperparathyroidism of renal origin: Secondary | ICD-10-CM | POA: Diagnosis not present

## 2012-06-24 DIAGNOSIS — N186 End stage renal disease: Secondary | ICD-10-CM | POA: Diagnosis not present

## 2012-06-24 DIAGNOSIS — D631 Anemia in chronic kidney disease: Secondary | ICD-10-CM | POA: Diagnosis not present

## 2012-06-26 DIAGNOSIS — D631 Anemia in chronic kidney disease: Secondary | ICD-10-CM | POA: Diagnosis not present

## 2012-06-26 DIAGNOSIS — D509 Iron deficiency anemia, unspecified: Secondary | ICD-10-CM | POA: Diagnosis not present

## 2012-06-26 DIAGNOSIS — N186 End stage renal disease: Secondary | ICD-10-CM | POA: Diagnosis not present

## 2012-06-26 DIAGNOSIS — N2581 Secondary hyperparathyroidism of renal origin: Secondary | ICD-10-CM | POA: Diagnosis not present

## 2012-06-28 DIAGNOSIS — D509 Iron deficiency anemia, unspecified: Secondary | ICD-10-CM | POA: Diagnosis not present

## 2012-06-28 DIAGNOSIS — D631 Anemia in chronic kidney disease: Secondary | ICD-10-CM | POA: Diagnosis not present

## 2012-06-28 DIAGNOSIS — N186 End stage renal disease: Secondary | ICD-10-CM | POA: Diagnosis not present

## 2012-06-28 DIAGNOSIS — N2581 Secondary hyperparathyroidism of renal origin: Secondary | ICD-10-CM | POA: Diagnosis not present

## 2012-07-01 ENCOUNTER — Emergency Department (HOSPITAL_BASED_OUTPATIENT_CLINIC_OR_DEPARTMENT_OTHER)
Admission: EM | Admit: 2012-07-01 | Discharge: 2012-07-01 | Disposition: A | Discharge status: Home / Self Care | Payer: Medicare Other | Attending: Emergency Medicine | Admitting: Emergency Medicine

## 2012-07-01 ENCOUNTER — Emergency Department (HOSPITAL_BASED_OUTPATIENT_CLINIC_OR_DEPARTMENT_OTHER): Payer: Medicare Other

## 2012-07-01 ENCOUNTER — Encounter (HOSPITAL_BASED_OUTPATIENT_CLINIC_OR_DEPARTMENT_OTHER): Payer: Self-pay | Admitting: *Deleted

## 2012-07-01 DIAGNOSIS — D631 Anemia in chronic kidney disease: Secondary | ICD-10-CM | POA: Diagnosis not present

## 2012-07-01 DIAGNOSIS — N289 Disorder of kidney and ureter, unspecified: Secondary | ICD-10-CM | POA: Diagnosis not present

## 2012-07-01 DIAGNOSIS — N186 End stage renal disease: Secondary | ICD-10-CM

## 2012-07-01 DIAGNOSIS — Z79899 Other long term (current) drug therapy: Secondary | ICD-10-CM | POA: Diagnosis not present

## 2012-07-01 DIAGNOSIS — J90 Pleural effusion, not elsewhere classified: Secondary | ICD-10-CM | POA: Diagnosis not present

## 2012-07-01 DIAGNOSIS — Z862 Personal history of diseases of the blood and blood-forming organs and certain disorders involving the immune mechanism: Secondary | ICD-10-CM | POA: Insufficient documentation

## 2012-07-01 DIAGNOSIS — I120 Hypertensive chronic kidney disease with stage 5 chronic kidney disease or end stage renal disease: Secondary | ICD-10-CM | POA: Insufficient documentation

## 2012-07-01 DIAGNOSIS — R05 Cough: Secondary | ICD-10-CM | POA: Insufficient documentation

## 2012-07-01 DIAGNOSIS — R062 Wheezing: Secondary | ICD-10-CM | POA: Insufficient documentation

## 2012-07-01 DIAGNOSIS — Z8673 Personal history of transient ischemic attack (TIA), and cerebral infarction without residual deficits: Secondary | ICD-10-CM | POA: Insufficient documentation

## 2012-07-01 DIAGNOSIS — Z8679 Personal history of other diseases of the circulatory system: Secondary | ICD-10-CM | POA: Diagnosis not present

## 2012-07-01 DIAGNOSIS — F17200 Nicotine dependence, unspecified, uncomplicated: Secondary | ICD-10-CM | POA: Diagnosis not present

## 2012-07-01 DIAGNOSIS — E119 Type 2 diabetes mellitus without complications: Secondary | ICD-10-CM | POA: Diagnosis not present

## 2012-07-01 DIAGNOSIS — D509 Iron deficiency anemia, unspecified: Secondary | ICD-10-CM | POA: Diagnosis not present

## 2012-07-01 DIAGNOSIS — R059 Cough, unspecified: Secondary | ICD-10-CM | POA: Diagnosis not present

## 2012-07-01 DIAGNOSIS — Z7902 Long term (current) use of antithrombotics/antiplatelets: Secondary | ICD-10-CM | POA: Insufficient documentation

## 2012-07-01 DIAGNOSIS — N2581 Secondary hyperparathyroidism of renal origin: Secondary | ICD-10-CM | POA: Diagnosis not present

## 2012-07-01 LAB — CBC WITH DIFFERENTIAL/PLATELET
Eosinophils Relative: 8 % — ABNORMAL HIGH (ref 0–5)
HCT: 33.2 % — ABNORMAL LOW (ref 39.0–52.0)
Lymphocytes Relative: 16 % (ref 12–46)
Lymphs Abs: 1 10*3/uL (ref 0.7–4.0)
MCV: 95.4 fL (ref 78.0–100.0)
Monocytes Absolute: 0.7 10*3/uL (ref 0.1–1.0)
RBC: 3.48 MIL/uL — ABNORMAL LOW (ref 4.22–5.81)
WBC: 6.2 10*3/uL (ref 4.0–10.5)

## 2012-07-01 LAB — BASIC METABOLIC PANEL
BUN: 64 mg/dL — ABNORMAL HIGH (ref 6–23)
CO2: 24 mEq/L (ref 19–32)
Calcium: 10.8 mg/dL — ABNORMAL HIGH (ref 8.4–10.5)
Creatinine, Ser: 12.3 mg/dL — ABNORMAL HIGH (ref 0.50–1.35)
Glucose, Bld: 165 mg/dL — ABNORMAL HIGH (ref 70–99)

## 2012-07-01 MED ORDER — ALBUTEROL SULFATE HFA 108 (90 BASE) MCG/ACT IN AERS: 1.0000 | INHALATION_SPRAY | Freq: Four times a day (QID) | RESPIRATORY_TRACT | Status: DC | PRN

## 2012-07-01 MED ORDER — FUROSEMIDE 10 MG/ML IJ SOLN
INTRAMUSCULAR | Status: AC
  Administered 2012-07-01: 40 mg via INTRAVENOUS
  Filled 2012-07-01: qty 4

## 2012-07-01 MED ORDER — FUROSEMIDE 10 MG/ML IJ SOLN: 40.0000 mg | Freq: Once | INTRAMUSCULAR | Status: AC

## 2012-07-01 NOTE — ED Notes (Addendum)
Pt sts he has been short of breath x2 weeks. Pt also c/o cough. Pt is able to speak in complete sentences. Pt receives dialysis on Tues, Thurs, & Sat. Pt did dialyze this past Saturday.

## 2012-07-01 NOTE — ED Provider Notes (Signed)
History    CSN: [id number] Arrival date & time 07/01/12  0139  First MD Initiated Contact with Patient 07/01/12 [phone]     Chief Complaint  Patient presents with  . Shortness of Breath   (Consider location/radiation/quality/duration/timing/severity/associated sxs/prior Treatment) Patient is a 52 y.o. male presenting with shortness of breath. The history is provided by the patient.  Shortness of Breath Severity:  Moderate Onset quality:  Gradual Duration:  2 weeks Timing:  Constant Progression:  Unchanged Chronicity:  Recurrent Context: not fumes   Relieved by:  Nothing Worsened by:  Nothing tried Ineffective treatments:  None tried Associated symptoms: cough and wheezing   Associated symptoms: no fever   Risk factors: tobacco use    Past Medical History  Diagnosis Date  . Hypertension   . Renal insufficiency   . Anemia   . Stroke   . Hemiparesis due to old stroke     left  . LV dysfunction     EF 25-30% by echo 07/2011  . MR (mitral regurgitation)     moderate to severe, echo 07/2011  . Shortness of breath   . Diabetes mellitus without complication    Past Surgical History  Procedure Laterality Date  . Kidney transplant      2011 rejected kidney 2012 back on dialysis  . Av fistula placement    . Cardiac catheterization      [place] without cardioversion  08/17/2011    Procedure: TRANSESOPHAGEAL ECHOCARDIOGRAM (TEE);  Surgeon: Larey Dresser, MD;  Location: [facility];  Service: Cardiovascular;  Laterality: N/A;   No family history on file. History  Substance Use Topics  . Smoking status: Current Every Day Smoker -- 0.50 packs/day    Types: Cigarettes  . Smokeless tobacco: Not on file  . Alcohol Use: No    Review of Systems  Constitutional: Negative for fever.  Respiratory: Positive for cough, shortness of breath and wheezing.   All other systems reviewed and are negative.    Allergies  Codeine  Home Medications   Current  Outpatient Rx  Name  Route  Sig  Dispense  Refill  . EXPIRED: amLODipine (NORVASC) 10 MG tablet   Oral   Take 1 tablet (10 mg total) by mouth at bedtime.   30 tablet   0   . cinacalcet (SENSIPAR) 30 MG tablet   Oral   Take 30 mg by mouth daily.         . cloNIDine (CATAPRES - DOSED IN MG/24 HR) 0.3 mg/24hr   Transdermal   Place 1 patch onto the skin once a week. Change on Tuesday         . clopidogrel (PLAVIX) 75 MG tablet   Oral   Take 1 tablet (75 mg total) by mouth daily with breakfast.   30 tablet   5   . guaiFENesin (MUCINEX) 600 MG 12 hr tablet   Oral   Take 1 tablet (600 mg total) by mouth 2 (two) times daily.         Marland Kitchen labetalol (NORMODYNE) 300 MG tablet   Oral   Take 150 mg by mouth 2 (two) times daily.         . multivitamin (RENA-VIT) TABS tablet   Oral   Take 1 tablet by mouth at bedtime.   30 tablet   0   . permethrin (ELIMITE) 5 % cream      Apply to entire body from neck down and leave on for  8-14 hours. Repeat in one week.   60 g   1    BP 181/105  Pulse 102  Temp(Src) 98.7 F (37.1 C) (Oral)  Resp 20  Wt 180 lb (81.647 kg)  BMI 26.57 kg/m2  SpO2 92% Physical Exam  Constitutional: He is oriented to person, place, and time. He appears well-developed and well-nourished. No distress.  HENT:  Head: Normocephalic and atraumatic.  Mouth/Throat: Oropharynx is clear and moist.  Eyes: Conjunctivae are normal. Pupils are equal, round, and reactive to light.  Neck: Normal range of motion. Neck supple.  Cardiovascular: Normal rate, regular rhythm and intact distal pulses.   Pulmonary/Chest: Effort normal and breath sounds normal. No respiratory distress. He has no wheezes. He has no rales.  Abdominal: Soft. Bowel sounds are normal. There is no tenderness. There is no rebound and no guarding.  Musculoskeletal: Normal range of motion. He exhibits no edema.  Neurological: He is alert and oriented to person, place, and time.  Skin: Skin is warm  and dry.  Psychiatric: He has a normal mood and affect.    ED Course  Procedures (including critical care time) Labs Reviewed  CBC WITH DIFFERENTIAL  BASIC METABOLIC PANEL  TROPONIN I  PRO B NATRIURETIC PEPTIDE   No results found. No diagnosis found.  MDM   Date: 07/01/2012  Rate: 91  Rhythm: normal sinus rhythm  QRS Axis: normal  Intervals: QT prolonged  ST/T Wave abnormalities: nonspecific ST changes  Conduction Disutrbances:none  Narrative Interpretation:   Old EKG Reviewed: unchanged   No active wheezing, no signs of infection on labs or CXR.  Small stable pleural effusion.  Is scheduled for dialysis at 1130 today.  Will give one dose of lasix to mobilize fluid in setting of BNP > 70K and give RX for albuterol for prn wheezing though patient instructed wheezing in ESRD should be evaluated as can be fluid overload.  Patient verbalizes understanding and agrees to follow up  Karnisha Lefebre Alfonso Patten, MD 07/01/12 [phone]

## 2012-07-01 NOTE — ED Notes (Signed)
MD at bedside. 

## 2012-07-02 ENCOUNTER — Encounter (HOSPITAL_BASED_OUTPATIENT_CLINIC_OR_DEPARTMENT_OTHER): Payer: Self-pay | Admitting: Emergency Medicine

## 2012-07-02 DIAGNOSIS — F17200 Nicotine dependence, unspecified, uncomplicated: Secondary | ICD-10-CM | POA: Insufficient documentation

## 2012-07-02 DIAGNOSIS — J984 Other disorders of lung: Secondary | ICD-10-CM | POA: Diagnosis not present

## 2012-07-02 DIAGNOSIS — Z8673 Personal history of transient ischemic attack (TIA), and cerebral infarction without residual deficits: Secondary | ICD-10-CM | POA: Diagnosis not present

## 2012-07-02 DIAGNOSIS — R0902 Hypoxemia: Secondary | ICD-10-CM | POA: Diagnosis not present

## 2012-07-02 DIAGNOSIS — I120 Hypertensive chronic kidney disease with stage 5 chronic kidney disease or end stage renal disease: Secondary | ICD-10-CM | POA: Diagnosis not present

## 2012-07-02 DIAGNOSIS — D509 Iron deficiency anemia, unspecified: Secondary | ICD-10-CM | POA: Diagnosis not present

## 2012-07-02 DIAGNOSIS — E119 Type 2 diabetes mellitus without complications: Secondary | ICD-10-CM | POA: Insufficient documentation

## 2012-07-02 DIAGNOSIS — J449 Chronic obstructive pulmonary disease, unspecified: Secondary | ICD-10-CM | POA: Diagnosis not present

## 2012-07-02 DIAGNOSIS — Z8679 Personal history of other diseases of the circulatory system: Secondary | ICD-10-CM | POA: Insufficient documentation

## 2012-07-02 DIAGNOSIS — N039 Chronic nephritic syndrome with unspecified morphologic changes: Secondary | ICD-10-CM | POA: Diagnosis not present

## 2012-07-02 DIAGNOSIS — Z862 Personal history of diseases of the blood and blood-forming organs and certain disorders involving the immune mechanism: Secondary | ICD-10-CM | POA: Insufficient documentation

## 2012-07-02 DIAGNOSIS — I69959 Hemiplegia and hemiparesis following unspecified cerebrovascular disease affecting unspecified side: Secondary | ICD-10-CM | POA: Insufficient documentation

## 2012-07-02 DIAGNOSIS — N186 End stage renal disease: Secondary | ICD-10-CM | POA: Diagnosis not present

## 2012-07-02 DIAGNOSIS — I059 Rheumatic mitral valve disease, unspecified: Secondary | ICD-10-CM | POA: Diagnosis present

## 2012-07-02 DIAGNOSIS — Z940 Kidney transplant status: Secondary | ICD-10-CM | POA: Insufficient documentation

## 2012-07-02 DIAGNOSIS — D649 Anemia, unspecified: Secondary | ICD-10-CM | POA: Diagnosis not present

## 2012-07-02 DIAGNOSIS — J90 Pleural effusion, not elsewhere classified: Secondary | ICD-10-CM | POA: Diagnosis not present

## 2012-07-02 DIAGNOSIS — J441 Chronic obstructive pulmonary disease with (acute) exacerbation: Secondary | ICD-10-CM | POA: Diagnosis not present

## 2012-07-02 DIAGNOSIS — I10 Essential (primary) hypertension: Secondary | ICD-10-CM | POA: Diagnosis not present

## 2012-07-02 DIAGNOSIS — N2581 Secondary hyperparathyroidism of renal origin: Secondary | ICD-10-CM | POA: Diagnosis present

## 2012-07-02 DIAGNOSIS — Z992 Dependence on renal dialysis: Secondary | ICD-10-CM | POA: Insufficient documentation

## 2012-07-02 DIAGNOSIS — Z79899 Other long term (current) drug therapy: Secondary | ICD-10-CM | POA: Insufficient documentation

## 2012-07-02 DIAGNOSIS — R05 Cough: Secondary | ICD-10-CM | POA: Insufficient documentation

## 2012-07-02 DIAGNOSIS — R0602 Shortness of breath: Secondary | ICD-10-CM | POA: Diagnosis not present

## 2012-07-02 DIAGNOSIS — R059 Cough, unspecified: Secondary | ICD-10-CM | POA: Insufficient documentation

## 2012-07-02 NOTE — ED Notes (Signed)
Pt c/o cough. Pt was seen here 2 days ago for same.

## 2012-07-03 ENCOUNTER — Emergency Department (HOSPITAL_BASED_OUTPATIENT_CLINIC_OR_DEPARTMENT_OTHER): Payer: Medicare Other

## 2012-07-03 ENCOUNTER — Emergency Department (HOSPITAL_BASED_OUTPATIENT_CLINIC_OR_DEPARTMENT_OTHER)
Admission: EM | Admit: 2012-07-03 | Discharge: 2012-07-03 | Disposition: A | Discharge status: Home / Self Care | Payer: Medicare Other | Source: Home / Self Care | Attending: Emergency Medicine | Admitting: Emergency Medicine

## 2012-07-03 DIAGNOSIS — R05 Cough: Secondary | ICD-10-CM

## 2012-07-03 DIAGNOSIS — N186 End stage renal disease: Secondary | ICD-10-CM | POA: Diagnosis not present

## 2012-07-03 DIAGNOSIS — N039 Chronic nephritic syndrome with unspecified morphologic changes: Secondary | ICD-10-CM | POA: Diagnosis not present

## 2012-07-03 DIAGNOSIS — D509 Iron deficiency anemia, unspecified: Secondary | ICD-10-CM | POA: Diagnosis not present

## 2012-07-03 DIAGNOSIS — N2581 Secondary hyperparathyroidism of renal origin: Secondary | ICD-10-CM | POA: Diagnosis not present

## 2012-07-03 DIAGNOSIS — R059 Cough, unspecified: Secondary | ICD-10-CM

## 2012-07-03 MED ORDER — AEROCHAMBER PLUS W/MASK MISC
1.0000 | Freq: Once | Status: AC
  Administered 2012-07-03: 1
  Filled 2012-07-03: qty 1

## 2012-07-03 MED ORDER — HYDROCOD POLST-CHLORPHEN POLST 10-8 MG/5ML PO LQCR: 5.0000 mL | Freq: Two times a day (BID) | ORAL | Status: DC | PRN

## 2012-07-03 NOTE — ED Notes (Signed)
Pt reports has dialysis in am, last dialysis Tuesday and reports they pulled off 3 liters, reports normal weight is 77.5 kg, today 82.6, pt reports frothy white sputum when coughing,  Denies difficulty breathing, only when coughing

## 2012-07-03 NOTE — ED Provider Notes (Signed)
History    CSN: [id number] Arrival date & time 07/02/12  2321  First MD Initiated Contact with Patient 07/03/12 [phone]     Chief Complaint  Patient presents with  . Cough   (Consider location/radiation/quality/duration/timing/severity/associated sxs/prior Treatment) HPI This is a 52 year old male with end-stage renal disease on hemodialysis. He was seen here for a cough and wheezing 2 days ago and discharged home with albuterol. He attributes the cough to that procedure back in August; he states the cough is gradually worsened since that time. He has had some relief with the albuterol but still has almost constant coughing, sometimes in paroxysms.  Past Medical History  Diagnosis Date  . Hypertension   . Renal insufficiency   . Anemia   . Stroke   . Hemiparesis due to old stroke     left  . LV dysfunction     EF 25-30% by echo 07/2011  . MR (mitral regurgitation)     moderate to severe, echo 07/2011  . Shortness of breath   . Diabetes mellitus without complication    Past Surgical History  Procedure Laterality Date  . Kidney transplant      2011 rejected kidney 2012 back on dialysis  . Av fistula placement    . Cardiac catheterization      [place] without cardioversion  08/17/2011    Procedure: TRANSESOPHAGEAL ECHOCARDIOGRAM (TEE);  Surgeon: Larey Dresser, MD;  Location: [facility];  Service: Cardiovascular;  Laterality: N/A;   No family history on file. History  Substance Use Topics  . Smoking status: Current Every Day Smoker -- 0.50 packs/day    Types: Cigarettes  . Smokeless tobacco: Not on file  . Alcohol Use: No    Review of Systems  All other systems reviewed and are negative.    Allergies  Codeine  Home Medications   Current Outpatient Rx  Name  Route  Sig  Dispense  Refill  . albuterol (PROVENTIL HFA;VENTOLIN HFA) 108 (90 BASE) MCG/ACT inhaler   Inhalation   Inhale 1-2 puffs into the lungs every 6 (six) hours as needed for  wheezing.   1 Inhaler   0   . EXPIRED: amLODipine (NORVASC) 10 MG tablet   Oral   Take 1 tablet (10 mg total) by mouth at bedtime.   30 tablet   0   . cinacalcet (SENSIPAR) 30 MG tablet   Oral   Take 30 mg by mouth daily.         . cloNIDine (CATAPRES - DOSED IN MG/24 HR) 0.3 mg/24hr   Transdermal   Place 1 patch onto the skin once a week. Change on Tuesday         . clopidogrel (PLAVIX) 75 MG tablet   Oral   Take 1 tablet (75 mg total) by mouth daily with breakfast.   30 tablet   5   . guaiFENesin (MUCINEX) 600 MG 12 hr tablet   Oral   Take 1 tablet (600 mg total) by mouth 2 (two) times daily.         Marland Kitchen labetalol (NORMODYNE) 300 MG tablet   Oral   Take 150 mg by mouth 2 (two) times daily.         . multivitamin (RENA-VIT) TABS tablet   Oral   Take 1 tablet by mouth at bedtime.   30 tablet   0   . permethrin (ELIMITE) 5 % cream      Apply to entire body from  neck down and leave on for 8-14 hours. Repeat in one week.   60 g   1    BP 122/82  Pulse 94  Temp(Src) 99.3 F (37.4 C) (Oral)  Resp 18  SpO2 94% Physical Exam General: Well-developed, well-nourished male in no acute distress; appearance consistent with age of record HENT: normocephalic, atraumatic; normal appearing oropharynx Eyes: pupils equal round and reactive to light; extraocular muscles intact Neck: supple Heart: regular rate and rhythm Lungs: clear to auscultation bilaterally; persistent dry cough Abdomen: soft; nondistended Extremities: No deformity; full range of motion; dialysis fistula right forearm Neurologic: Awake, alert and oriented; motor function intact in all extremities and symmetric; no facial droop Skin: Warm and dry Psychiatric: Flat affect    ED Course  Procedures (including critical care time)    MDM  Nursing notes and vitals signs, including pulse oximetry, reviewed.  Summary of this visit's results, reviewed by myself:  Imaging Studies: Dg Chest 2  View  07/03/2012   *RADIOLOGY REPORT*  Clinical Data: Cough.  CHEST - 2 VIEW  Comparison: 07/01/2012  Findings: Stable small right pleural effusion with right lower lung airspace disease.  This is stable dating back to last December compatible with scarring.  Cardiomegaly.  Left lung is clear.  No acute bony abnormality.  IMPRESSION: Stable chronic changes on the right with chronic scarring in the right lung base and chronic right pleural thickening.   Original Report Authenticated By: Rolm Baptise, M.D.   3:49 AM We will have the respiratory therapist provide the patient with AeroChamber and instructed on proper use of the inhaler. He was advised to consult with his primary care physician at his next visit.   Wynetta Fines, MD 07/03/12 [phone]

## 2012-07-05 ENCOUNTER — Inpatient Hospital Stay (HOSPITAL_COMMUNITY)
Admission: EM | Admit: 2012-07-05 | Discharge: 2012-07-08 | DRG: 190 | Disposition: A | Discharge status: Home / Self Care | Payer: Medicare Other | Attending: Internal Medicine | Admitting: Internal Medicine

## 2012-07-05 ENCOUNTER — Encounter (HOSPITAL_COMMUNITY): Payer: Self-pay | Admitting: *Deleted

## 2012-07-05 ENCOUNTER — Emergency Department (HOSPITAL_COMMUNITY): Payer: Medicare Other

## 2012-07-05 DIAGNOSIS — I69959 Hemiplegia and hemiparesis following unspecified cerebrovascular disease affecting unspecified side: Secondary | ICD-10-CM

## 2012-07-05 DIAGNOSIS — N186 End stage renal disease: Secondary | ICD-10-CM | POA: Diagnosis not present

## 2012-07-05 DIAGNOSIS — J90 Pleural effusion, not elsewhere classified: Secondary | ICD-10-CM

## 2012-07-05 DIAGNOSIS — I6340 Cerebral infarction due to embolism of unspecified cerebral artery: Secondary | ICD-10-CM

## 2012-07-05 DIAGNOSIS — K921 Melena: Secondary | ICD-10-CM

## 2012-07-05 DIAGNOSIS — I8500 Esophageal varices without bleeding: Secondary | ICD-10-CM

## 2012-07-05 DIAGNOSIS — I10 Essential (primary) hypertension: Secondary | ICD-10-CM

## 2012-07-05 DIAGNOSIS — Z91199 Patient's noncompliance with other medical treatment and regimen due to unspecified reason: Secondary | ICD-10-CM

## 2012-07-05 DIAGNOSIS — Z8673 Personal history of transient ischemic attack (TIA), and cerebral infarction without residual deficits: Secondary | ICD-10-CM

## 2012-07-05 DIAGNOSIS — J449 Chronic obstructive pulmonary disease, unspecified: Secondary | ICD-10-CM | POA: Diagnosis present

## 2012-07-05 DIAGNOSIS — N039 Chronic nephritic syndrome with unspecified morphologic changes: Secondary | ICD-10-CM | POA: Diagnosis not present

## 2012-07-05 DIAGNOSIS — Z940 Kidney transplant status: Secondary | ICD-10-CM

## 2012-07-05 DIAGNOSIS — Z992 Dependence on renal dialysis: Secondary | ICD-10-CM

## 2012-07-05 DIAGNOSIS — I120 Hypertensive chronic kidney disease with stage 5 chronic kidney disease or end stage renal disease: Secondary | ICD-10-CM | POA: Diagnosis present

## 2012-07-05 DIAGNOSIS — F17200 Nicotine dependence, unspecified, uncomplicated: Secondary | ICD-10-CM | POA: Diagnosis present

## 2012-07-05 DIAGNOSIS — Z9119 Patient's noncompliance with other medical treatment and regimen: Secondary | ICD-10-CM

## 2012-07-05 DIAGNOSIS — I059 Rheumatic mitral valve disease, unspecified: Secondary | ICD-10-CM | POA: Diagnosis present

## 2012-07-05 DIAGNOSIS — N2581 Secondary hyperparathyroidism of renal origin: Secondary | ICD-10-CM | POA: Diagnosis not present

## 2012-07-05 DIAGNOSIS — J984 Other disorders of lung: Secondary | ICD-10-CM | POA: Diagnosis present

## 2012-07-05 DIAGNOSIS — R0902 Hypoxemia: Secondary | ICD-10-CM

## 2012-07-05 DIAGNOSIS — J441 Chronic obstructive pulmonary disease with (acute) exacerbation: Principal | ICD-10-CM

## 2012-07-05 DIAGNOSIS — E119 Type 2 diabetes mellitus without complications: Secondary | ICD-10-CM

## 2012-07-05 DIAGNOSIS — R0602 Shortness of breath: Secondary | ICD-10-CM

## 2012-07-05 DIAGNOSIS — Z720 Tobacco use: Secondary | ICD-10-CM

## 2012-07-05 DIAGNOSIS — D649 Anemia, unspecified: Secondary | ICD-10-CM

## 2012-07-05 DIAGNOSIS — D509 Iron deficiency anemia, unspecified: Secondary | ICD-10-CM | POA: Diagnosis not present

## 2012-07-05 LAB — CBC WITH DIFFERENTIAL/PLATELET
Basophils Relative: 1 % (ref 0–1)
Eosinophils Absolute: 0.4 10*3/uL (ref 0.0–0.7)
HCT: 35.8 % — ABNORMAL LOW (ref 39.0–52.0)
Hemoglobin: 11 g/dL — ABNORMAL LOW (ref 13.0–17.0)
MCH: 28.6 pg (ref 26.0–34.0)
MCHC: 30.7 g/dL (ref 30.0–36.0)
Monocytes Absolute: 0.4 10*3/uL (ref 0.1–1.0)
Monocytes Relative: 9 % (ref 3–12)
Neutro Abs: 2.8 10*3/uL (ref 1.7–7.7)

## 2012-07-05 LAB — POCT I-STAT TROPONIN I: Troponin i, poc: 0.13 ng/mL (ref 0.00–0.08)

## 2012-07-05 LAB — BASIC METABOLIC PANEL
BUN: 17 mg/dL (ref 6–23)
Chloride: 94 mEq/L — ABNORMAL LOW (ref 96–112)
Creatinine, Ser: 6.03 mg/dL — ABNORMAL HIGH (ref 0.50–1.35)
GFR calc Af Amer: 11 mL/min — ABNORMAL LOW (ref >90)

## 2012-07-05 LAB — TROPONIN I: Troponin I: <0.3 ng/mL (ref <0.30)

## 2012-07-05 MED ORDER — ALBUTEROL SULFATE (5 MG/ML) 0.5% IN NEBU
2.5000 mg | INHALATION_SOLUTION | Freq: Once | RESPIRATORY_TRACT | Status: AC
  Administered 2012-07-05: 2.5 mg via RESPIRATORY_TRACT
  Filled 2012-07-05: qty 0.5

## 2012-07-05 MED ORDER — IPRATROPIUM BROMIDE 0.02 % IN SOLN
0.5000 mg | Freq: Once | RESPIRATORY_TRACT | Status: AC
  Administered 2012-07-05: 0.5 mg via RESPIRATORY_TRACT
  Filled 2012-07-05: qty 2.5

## 2012-07-05 MED ORDER — DEXAMETHASONE SODIUM PHOSPHATE 10 MG/ML IJ SOLN
10.0000 mg | Freq: Once | INTRAMUSCULAR | Status: AC
  Administered 2012-07-05: 10 mg via INTRAVENOUS
  Filled 2012-07-05: qty 1

## 2012-07-05 NOTE — ED Provider Notes (Signed)
History    CSN: [id number] Arrival date & time 07/05/12  [id number]  First MD Initiated Contact with Patient 07/05/12 2018     Chief Complaint  Patient presents with  . Shortness of Breath   (Consider location/radiation/quality/duration/timing/severity/associated sxs/prior Treatment) HPI Comments: Patient is a 52 year old male with history of CKD who is currently on dialysis. He was last at dialysis today. He is complaining of shortness of breath for the past 2 months, worsening this morning. He states he was given cough syrup with hydrocodone at his last visit for this shortness of breath which he states he is allergic to. He has been having worsening shortness of breath since he took it this morning. He was able to complete dialysis. He admits to a productive cough with a change in the color of his sputum. He was given instruction on how to appropriately use his inhaler when he was seen 2 days ago. He has used his inhaler today without relief of his symptoms. He denies chest pain, nausea, vomiting, abdominal pain, fevers, chills.   The history is provided by the patient. No language interpreter was used.   Past Medical History  Diagnosis Date  . Hypertension   . Renal insufficiency   . Anemia   . Stroke   . Hemiparesis due to old stroke     left  . LV dysfunction     EF 25-30% by echo 07/2011  . MR (mitral regurgitation)     moderate to severe, echo 07/2011  . Shortness of breath   . Diabetes mellitus without complication    Past Surgical History  Procedure Laterality Date  . Kidney transplant      2011 rejected kidney 2012 back on dialysis  . Av fistula placement    . Cardiac catheterization      [place] without cardioversion  08/17/2011    Procedure: TRANSESOPHAGEAL ECHOCARDIOGRAM (TEE);  Surgeon: Larey Dresser, MD;  Location: [facility];  Service: Cardiovascular;  Laterality: N/A;   No family history on file. History  Substance Use Topics  . Smoking  status: Current Every Day Smoker -- 0.50 packs/day    Types: Cigarettes  . Smokeless tobacco: Not on file  . Alcohol Use: No    Review of Systems  Constitutional: Negative for fever, chills and appetite change.  Respiratory: Positive for cough, shortness of breath and wheezing.   Cardiovascular: Negative for chest pain.  Gastrointestinal: Negative for nausea, vomiting and abdominal pain.  All other systems reviewed and are negative.    Allergies  Codeine  Home Medications   Current Outpatient Rx  Name  Route  Sig  Dispense  Refill  . albuterol (PROVENTIL HFA;VENTOLIN HFA) 108 (90 BASE) MCG/ACT inhaler   Inhalation   Inhale 1-2 puffs into the lungs every 6 (six) hours as needed for wheezing.   1 Inhaler   0   . amLODipine (NORVASC) 10 MG tablet   Oral   Take 10 mg by mouth every evening.         . chlorpheniramine-HYDROcodone (TUSSIONEX PENNKINETIC ER) 10-8 MG/5ML LQCR   Oral   Take 5 mLs by mouth every 12 (twelve) hours as needed (cough).   115 mL   0   . cloNIDine (CATAPRES - DOSED IN MG/24 HR) 0.3 mg/24hr   Transdermal   Place 1 patch onto the skin once a week. Change on Tuesday         . labetalol (NORMODYNE) 300 MG tablet  Oral   Take 150 mg by mouth 2 (two) times daily.         . multivitamin (RENA-VIT) TABS tablet   Oral   Take 1 tablet by mouth at bedtime.   30 tablet   0    BP 173/89  Pulse 102  Temp(Src) 99.9 F (37.7 C) (Oral)  Resp 22  Wt 168 lb 10.4 oz (76.499 kg)  BMI 24.89 kg/m2  SpO2 100% Physical Exam  Nursing note and vitals reviewed. Constitutional: He is oriented to person, place, and time. He appears well-developed and well-nourished. No distress.  HENT:  Head: Normocephalic and atraumatic.  Right Ear: External ear normal.  Left Ear: External ear normal.  Nose: Nose normal.  Eyes: Conjunctivae are normal.  Neck: Normal range of motion. No tracheal deviation present.  Cardiovascular: Normal rate, regular rhythm and  normal heart sounds.   Pulmonary/Chest: Effort normal. No stridor. He has wheezes (inspiratory and expiratory ). He has rhonchi.  Abdominal: Soft. He exhibits no distension. There is no tenderness.  Musculoskeletal: Normal range of motion.  Neurological: He is alert and oriented to person, place, and time.  Skin: Skin is warm and dry. He is not diaphoretic.  Psychiatric: He has a normal mood and affect. His behavior is normal.    ED Course  Procedures (including critical care time) Labs Reviewed  CBC WITH DIFFERENTIAL - Abnormal; Notable for the following:    RBC 3.84 (*)    Hemoglobin 11.0 (*)    HCT 35.8 (*)    RDW 16.5 (*)    Platelets 137 (*)    Lymphs Abs 0.6 (*)    Eosinophils Relative 9 (*)    All other components within normal limits  BASIC METABOLIC PANEL - Abnormal; Notable for the following:    Chloride 94 (*)    Creatinine, Ser 6.03 (*)    GFR calc non Af Amer 10 (*)    GFR calc Af Amer 11 (*)    All other components within normal limits  PRO B NATRIURETIC PEPTIDE - Abnormal; Notable for the following:    Pro B Natriuretic peptide (BNP) >70000.0 (*)    All other components within normal limits  POCT I-STAT TROPONIN I - Abnormal; Notable for the following:    Troponin i, poc 0.13 (*)    All other components within normal limits  TROPONIN I   Dg Chest 2 View  07/05/2012   *RADIOLOGY REPORT*  Clinical Data: Short of breath.  Right-sided chest pain. Hypertension.  CHEST - 2 VIEW  Comparison: 07/03/2012  Findings: Artifact overlies the chest.  Cardiac silhouette remains enlarged.  The left lung is clear except for chronic interstitial markings.  Pleural density on the right with some associated patchy density in the right lower lung appears quite similar.  No new finding.  IMPRESSION: Persisting cardiomegaly.  Persistent pleural fluid on the right with persistent density in the right lower lung, much which probably represents scarring.  However, there could be some  atelectasis and/or infiltrate at the right lung base.  No new finding.   Original Report Authenticated By: Nelson Chimes, M.D.    Date: 07/06/2012  Rate: 101  Rhythm: sinus tachycardia  QRS Axis: normal  Intervals: normal  ST/T Wave abnormalities: normal  Conduction Disutrbances:nonspecific intraventricular conduction delay  Narrative Interpretation:   Old EKG Reviewed: unchanged    1. COPD exacerbation   2. Hypoxia   3. End stage renal disease     MDM  Patient still  hypoxic with ambulation after 2 neb tx and dose of decadron. Zithromax given in ED. Consulted hospitalist who agrees to admit for copd exacerbation. Vital signs stable for transfer. Patient agreeable.   Medications  albuterol (PROVENTIL) (5 MG/ML) 0.5% nebulizer solution 2.5 mg (2.5 mg Nebulization Given 07/05/12 2138)  ipratropium (ATROVENT) nebulizer solution 0.5 mg (0.5 mg Nebulization Given 07/05/12 2138)  dexamethasone (DECADRON) injection 10 mg (10 mg Intravenous Given 07/05/12 2317)  albuterol (PROVENTIL) (5 MG/ML) 0.5% nebulizer solution 2.5 mg (2.5 mg Nebulization Given 07/05/12 2312)  azithromycin (ZITHROMAX) tablet 500 mg (500 mg Oral Given 07/06/12 0057)       Elwyn Lade, PA-C 07/06/12 0120

## 2012-07-05 NOTE — ED Notes (Signed)
Pt c/o of shortness of breath and wheezing, he thinks it may be related to medicine started 2 days ago, did not bring med with him. cough

## 2012-07-06 DIAGNOSIS — R0602 Shortness of breath: Secondary | ICD-10-CM

## 2012-07-06 DIAGNOSIS — I10 Essential (primary) hypertension: Secondary | ICD-10-CM

## 2012-07-06 DIAGNOSIS — J449 Chronic obstructive pulmonary disease, unspecified: Secondary | ICD-10-CM | POA: Diagnosis present

## 2012-07-06 DIAGNOSIS — N186 End stage renal disease: Secondary | ICD-10-CM

## 2012-07-06 DIAGNOSIS — J90 Pleural effusion, not elsewhere classified: Secondary | ICD-10-CM

## 2012-07-06 DIAGNOSIS — J441 Chronic obstructive pulmonary disease with (acute) exacerbation: Principal | ICD-10-CM

## 2012-07-06 DIAGNOSIS — F17200 Nicotine dependence, unspecified, uncomplicated: Secondary | ICD-10-CM

## 2012-07-06 DIAGNOSIS — R0902 Hypoxemia: Secondary | ICD-10-CM

## 2012-07-06 DIAGNOSIS — J984 Other disorders of lung: Secondary | ICD-10-CM | POA: Diagnosis present

## 2012-07-06 MED ORDER — RENA-VITE PO TABS
1.0000 | ORAL_TABLET | Freq: Every day | ORAL | Status: DC
  Administered 2012-07-06 – 2012-07-07 (×2): 1 via ORAL
  Filled 2012-07-06 (×3): qty 1

## 2012-07-06 MED ORDER — AZITHROMYCIN 250 MG PO TABS
500.0000 mg | ORAL_TABLET | Freq: Once | ORAL | Status: AC
  Administered 2012-07-06: 500 mg via ORAL
  Filled 2012-07-06: qty 2

## 2012-07-06 MED ORDER — ALBUTEROL SULFATE (5 MG/ML) 0.5% IN NEBU
2.5000 mg | INHALATION_SOLUTION | Freq: Four times a day (QID) | RESPIRATORY_TRACT | Status: DC
  Administered 2012-07-06 (×2): 2.5 mg via RESPIRATORY_TRACT
  Filled 2012-07-06 (×2): qty 0.5

## 2012-07-06 MED ORDER — AZITHROMYCIN 500 MG PO TABS
500.0000 mg | ORAL_TABLET | Freq: Every day | ORAL | Status: AC
  Administered 2012-07-06: 500 mg via ORAL
  Filled 2012-07-06: qty 1

## 2012-07-06 MED ORDER — PREDNISONE 20 MG PO TABS
20.0000 mg | ORAL_TABLET | Freq: Two times a day (BID) | ORAL | Status: DC
  Administered 2012-07-06: 20 mg via ORAL
  Filled 2012-07-06 (×3): qty 1

## 2012-07-06 MED ORDER — AMLODIPINE BESYLATE 10 MG PO TABS
10.0000 mg | ORAL_TABLET | Freq: Every evening | ORAL | Status: DC
  Administered 2012-07-06 – 2012-07-07 (×2): 10 mg via ORAL
  Filled 2012-07-06 (×3): qty 1

## 2012-07-06 MED ORDER — ALBUTEROL SULFATE (5 MG/ML) 0.5% IN NEBU: 2.5000 mg | INHALATION_SOLUTION | RESPIRATORY_TRACT | Status: DC | PRN

## 2012-07-06 MED ORDER — HYDROCOD POLST-CHLORPHEN POLST 10-8 MG/5ML PO LQCR
5.0000 mL | Freq: Two times a day (BID) | ORAL | Status: DC | PRN
  Administered 2012-07-06 – 2012-07-08 (×4): 5 mL via ORAL
  Filled 2012-07-06 (×6): qty 5

## 2012-07-06 MED ORDER — AZITHROMYCIN 250 MG PO TABS
250.0000 mg | ORAL_TABLET | Freq: Every day | ORAL | Status: DC
  Administered 2012-07-07 – 2012-07-08 (×2): 250 mg via ORAL
  Filled 2012-07-06 (×2): qty 1

## 2012-07-06 MED ORDER — LABETALOL HCL 300 MG PO TABS
150.0000 mg | ORAL_TABLET | Freq: Two times a day (BID) | ORAL | Status: DC
  Administered 2012-07-06 – 2012-07-08 (×5): 150 mg via ORAL
  Filled 2012-07-06 (×6): qty 0.5

## 2012-07-06 MED ORDER — IPRATROPIUM BROMIDE 0.02 % IN SOLN
0.5000 mg | Freq: Four times a day (QID) | RESPIRATORY_TRACT | Status: DC
  Administered 2012-07-06 (×2): 0.5 mg via RESPIRATORY_TRACT
  Filled 2012-07-06 (×2): qty 2.5

## 2012-07-06 MED ORDER — HEPARIN SODIUM (PORCINE) 5000 UNIT/ML IJ SOLN
5000.0000 [IU] | Freq: Three times a day (TID) | INTRAMUSCULAR | Status: DC
  Administered 2012-07-06 – 2012-07-08 (×6): 5000 [IU] via SUBCUTANEOUS
  Filled 2012-07-06 (×9): qty 1

## 2012-07-06 MED ORDER — METHYLPREDNISOLONE SODIUM SUCC 125 MG IJ SOLR
60.0000 mg | Freq: Two times a day (BID) | INTRAMUSCULAR | Status: DC
  Administered 2012-07-06 – 2012-07-07 (×3): 60 mg via INTRAVENOUS
  Filled 2012-07-06 (×4): qty 0.96

## 2012-07-06 MED ORDER — ALBUTEROL SULFATE HFA 108 (90 BASE) MCG/ACT IN AERS: 1.0000 | INHALATION_SPRAY | Freq: Four times a day (QID) | RESPIRATORY_TRACT | Status: DC | PRN

## 2012-07-06 MED ORDER — ONDANSETRON HCL 4 MG PO TABS: 4.0000 mg | ORAL_TABLET | Freq: Four times a day (QID) | ORAL | Status: DC | PRN

## 2012-07-06 MED ORDER — SODIUM CHLORIDE 0.9 % IJ SOLN: 3.0000 mL | Freq: Two times a day (BID) | INTRAMUSCULAR | Status: DC

## 2012-07-06 MED ORDER — SODIUM CHLORIDE 0.9 % IV SOLN: 250.0000 mL | INTRAVENOUS | Status: DC | PRN

## 2012-07-06 MED ORDER — ALBUTEROL SULFATE (5 MG/ML) 0.5% IN NEBU
2.5000 mg | INHALATION_SOLUTION | Freq: Four times a day (QID) | RESPIRATORY_TRACT | Status: DC
  Administered 2012-07-07 – 2012-07-08 (×4): 2.5 mg via RESPIRATORY_TRACT
  Filled 2012-07-06 (×5): qty 0.5

## 2012-07-06 MED ORDER — SODIUM CHLORIDE 0.9 % IJ SOLN
3.0000 mL | Freq: Two times a day (BID) | INTRAMUSCULAR | Status: DC
  Administered 2012-07-06 – 2012-07-08 (×5): 3 mL via INTRAVENOUS

## 2012-07-06 MED ORDER — CLONIDINE HCL 0.3 MG/24HR TD PTWK
0.3000 mg | MEDICATED_PATCH | TRANSDERMAL | Status: DC
  Administered 2012-07-06: 0.3 mg via TRANSDERMAL
  Filled 2012-07-06: qty 1

## 2012-07-06 MED ORDER — SODIUM CHLORIDE 0.9 % IJ SOLN: 3.0000 mL | INTRAMUSCULAR | Status: DC | PRN

## 2012-07-06 MED ORDER — IPRATROPIUM BROMIDE 0.02 % IN SOLN
0.5000 mg | Freq: Four times a day (QID) | RESPIRATORY_TRACT | Status: DC
  Administered 2012-07-07 – 2012-07-08 (×4): 0.5 mg via RESPIRATORY_TRACT
  Filled 2012-07-06 (×5): qty 2.5

## 2012-07-06 MED ORDER — ONDANSETRON HCL 4 MG/2ML IJ SOLN: 4.0000 mg | Freq: Four times a day (QID) | INTRAMUSCULAR | Status: DC | PRN

## 2012-07-06 NOTE — Progress Notes (Signed)
[facility] PROGRESS NOTE  TRAESHAWN ALBANY [id number] DOB: [birth year] DOA: 07/05/2012 PCP: Windy Kalata, MD  Assessment/Plan: Acute COPD exacerbation -Discontinue prednisone -Start the patient on IV Solu-Medrol -Start aerosolized albuterol and Atrovent -Continue azithromycin -Supplemental oxygen as necessary ESRD -Patient has been compliant with dialysis, Tuesday-Thursday, Saturday -Consult nephrology if the patient needs to stay by Tuesday for dialysis -Appears euvolemic -History of failed renal transplant Elevated proBNP -Multifactorial including the patient's ESRD -Appears euvolemic Chronic right pleural effusion -Status VATS with decortication August 2013 -Chest x-ray remained stable Tobacco abuse -20 pack year history -Patient had continued to smoke -Tobacco cessation discussed Hypertension Continue amlodipine, labetalol, clonidine TTS 3  Family Communication:   Pt at beside Disposition Plan:   Home when medically stable     Antibiotics:  Azithromycin 07/05/2012>>>    Procedures/Studies: Dg Chest 2 View  07/05/2012   *RADIOLOGY REPORT*  Clinical Data: Short of breath.  Right-sided chest pain. Hypertension.  CHEST - 2 VIEW  Comparison: 07/03/2012  Findings: Artifact overlies the chest.  Cardiac silhouette remains enlarged.  The left lung is clear except for chronic interstitial markings.  Pleural density on the right with some associated patchy density in the right lower lung appears quite similar.  No new finding.  IMPRESSION: Persisting cardiomegaly.  Persistent pleural fluid on the right with persistent density in the right lower lung, much which probably represents scarring.  However, there could be some atelectasis and/or infiltrate at the right lung base.  No new finding.   Original Report Authenticated By: Nelson Chimes, M.D.   Dg Chest 2 View  07/03/2012   *RADIOLOGY REPORT*  Clinical Data: Cough.  CHEST - 2 VIEW  Comparison: 07/01/2012   Findings: Stable small right pleural effusion with right lower lung airspace disease.  This is stable dating back to last December compatible with scarring.  Cardiomegaly.  Left lung is clear.  No acute bony abnormality.  IMPRESSION: Stable chronic changes on the right with chronic scarring in the right lung base and chronic right pleural thickening.   Original Report Authenticated By: Rolm Baptise, M.D.   Dg Chest 2 View  07/01/2012   *RADIOLOGY REPORT*  Clinical Data: Shortness of breath.  Hypertension.  CHEST - 2 VIEW  Comparison: 06/10/2012  Findings: Small chronic right pleural effusion, stable.  Chronic opacities in the right lower lung again noted, not significantly changed.  No focal opacity on the left.  Heart is mildly enlarged. Mediastinal contours are within normal limits.  No left effusion. No acute bony abnormality.  IMPRESSION: Stable chronic changes on the right with chronic right effusion and chronic densities in the right lung base.  No acute process.   Original Report Authenticated By: Rolm Baptise, M.D.   Dg Chest 2 View  06/10/2012   *RADIOLOGY REPORT*  Clinical Data: Shortness of breath.  CHEST - 2 VIEW  Comparison: Multiple priors, most recently chest x-ray 03/04/2012.  Findings: Small chronic right-sided pleural effusion is similar to the prior examination.  There is again architectural distortion and chronic opacities throughout the right mid to lower hemithorax, similar to prior studies, which appears to correspond with areas of chronic scarring and probable rounded atelectasis in the right lower lobe, based on comparison with prior chest CT 07/23/2011. Left lung appears clear.  No evidence of pulmonary edema.  Heart size appears mildly enlarged (unchanged).  Upper mediastinal contours are within normal limits.  Atherosclerosis in the thoracic aorta.  IMPRESSION: 1.  No significant change compared to the prior examination.  The radiographic appearance of the chest is very similar, as  above, without evidence of acute cardiopulmonary disease.   Original Report Authenticated By: Vinnie Langton, M.D.         Subjective: Patient states that his breathing is about 50% better. Still has a significant cough. Denies any fevers, chills, chest discomfort, nausea, vomiting, diarrhea, abdominal pain dizziness, syncope. No hemoptysis  Objective: Filed Vitals:   07/06/12 0020 07/06/12 0045 07/06/12 0339 07/06/12 0847  BP: 167/93 137/87 165/92 151/88  Pulse: 102 95 95 95  Temp: 99.2 F (37.3 C)  99.1 F (37.3 C) 98.4 F (36.9 C)  TempSrc: Oral  Oral Oral  Resp: 20  22 20   Height:   5' 9.5" (1.765 m)   Weight:   81.5 kg (179 lb 10.8 oz)   SpO2: 98% 97% 99% 93%   No intake or output data in the 24 hours ending 07/06/12 1045 Weight change:  Exam:   General:  Pt is alert, follows commands appropriately, not in acute distress  HEENT: No icterus, No thrush,  Fowler/AT  Cardiovascular: RRR, S1/S2, no rubs, no gallops  Respiratory: Bilateral expiratory wheeze and scattered rales. Diminished breath sounds right base.   Abdomen: Soft/+BS, non tender, non distended, no guarding  Extremities: 1+LE edema, No lymphangitis, No petechiae, No rashes, no synovitis; palpable left upper extremity thrill  Data Reviewed: Basic Metabolic Panel:  Recent Labs Lab 07/01/12 0210 07/05/12 1950  NA 139 136  K 4.6 3.8  CL 96 94*  CO2 24 29  GLUCOSE 165* 79  BUN 64* 17  CREATININE 12.30* 6.03*  CALCIUM 10.8* 9.5   Liver Function Tests: No results found for this basename: AST, ALT, ALKPHOS, BILITOT, PROT, ALBUMIN,  in the last 168 hours No results found for this basename: LIPASE, AMYLASE,  in the last 168 hours No results found for this basename: AMMONIA,  in the last 168 hours CBC:  Recent Labs Lab 07/01/12 0210 07/05/12 1950  WBC 6.2 4.3  NEUTROABS 4.0 2.8  HGB 10.7* 11.0*  HCT 33.2* 35.8*  MCV 95.4 93.2  PLT 165 137*   Cardiac Enzymes:  Recent Labs Lab  07/01/12 0210 07/05/12 1950  TROPONINI <0.30 <0.30   BNP: No components found with this basename: POCBNP,  CBG: No results found for this basename: GLUCAP,  in the last 168 hours  Recent Results (from the past 240 hour(s))  MRSA PCR SCREENING     Status: None   Collection Time    07/06/12  4:35 AM      Result Value Range Status   MRSA by PCR NEGATIVE  NEGATIVE Final   Comment:            The GeneXpert MRSA Assay (FDA     approved for NASAL specimens     only), is one component of a     comprehensive MRSA colonization     surveillance program. It is not     intended to diagnose MRSA     infection nor to guide or     monitor treatment for     MRSA infections.     Scheduled Meds: . ipratropium  0.5 mg Nebulization Q6H   And  . albuterol  2.5 mg Nebulization Q6H  . amLODipine  10 mg Oral QPM  . azithromycin  500 mg Oral Daily   Followed by  . [START ON 07/07/2012] azithromycin  250 mg Oral Daily  . cloNIDine  0.3 mg Transdermal Weekly  . heparin  5,000 Units Subcutaneous Q8H  . labetalol  150 mg Oral BID  . methylPREDNISolone (SOLU-MEDROL) injection  60 mg Intravenous Q12H  . multivitamin  1 tablet Oral QHS  . sodium chloride  3 mL Intravenous Q12H  . sodium chloride  3 mL Intravenous Q12H   Continuous Infusions:    Cheri Ayotte, DO  [facility] Pager [phone]  If 7PM-7AM, please contact night-coverage [email or web address] Password [email or web address] 07/06/2012, 10:45 AM   LOS: 1 day

## 2012-07-06 NOTE — ED Provider Notes (Signed)
Medical screening examination/treatment/procedure(s) were conducted as a shared visit with non-physician practitioner(s) and myself.  I personally evaluated the patient during the encounter   Pt with cough, chills at home.  Pt is a smoker, no h/o COPD or asthma, however I question if COPD has been undiagnosed.  No sig CP, had full dialysis today, so doubt sig pulmonary edema is cause.  Pt is smoking, oral temp is mildly up at 100.  Likely pt has bronchitis.  CXR shows pleural effusion.  On exam, pt has persistent exp wheezing, slightly worse on right which is where effusion is seen.  Neb treatments and pt felt improved, however with ambulation, RA sats dropped into low 80's with pt being symptomatic.  Will start IV abx, continue nebs, call for admission.  Initially, POC troponin done, however given h/o CHF, renal failure, I suspect false positive.  Main lab troponin is negative.      Saddie Benders. Dorna Mai, MD 07/06/12 [id number]

## 2012-07-06 NOTE — ED Notes (Signed)
Pt ambulated with O2 Saturation 82-86% on room air.  Pt stated he felt fine and not short of breath while ambulating, EDPA made aware.

## 2012-07-06 NOTE — H&P (Signed)
PCP:   Windy Kalata, MD   Chief Complaint:  Sob x 2 months  HPI: 52 yo male esrd on dialysis, s/p rejected kidney transplant, htn, tobacco abuse, chronic rt sided pleural effusion comes in with 2 months of sob.  No le edema or swelling.  Compliant with dialysis, last one today.  No fevers.  Some cough and recent uri symptoms mainly nasal congestion.  No n/v/d.  No cp no abd pain.  Tolerating po.  Received alb nebs, and azithro with solumedrol in ED clinically improved but upon ambulation his oxygen sats dropped to 82%.  Pt says he has never been told he needs home oxygen.  Review of Systems:  Positive and negative as per HPI otherwise all other systems are negative  Past Medical History: Past Medical History  Diagnosis Date  . Hypertension   . Renal insufficiency   . Anemia   . Stroke   . Hemiparesis due to old stroke     left  . LV dysfunction     EF 25-30% by echo 07/2011  . MR (mitral regurgitation)     moderate to severe, echo 07/2011  . Shortness of breath   . Diabetes mellitus without complication    Past Surgical History  Procedure Laterality Date  . Kidney transplant      2011 rejected kidney 2012 back on dialysis  . Av fistula placement    . Cardiac catheterization      [place] without cardioversion  08/17/2011    Procedure: TRANSESOPHAGEAL ECHOCARDIOGRAM (TEE);  Surgeon: Larey Dresser, MD;  Location: [place];  Service: Cardiovascular;  Laterality: N/A;    Medications: Prior to Admission medications   Medication Sig Start Date End Date Taking? Authorizing Provider  albuterol (PROVENTIL HFA;VENTOLIN HFA) 108 (90 BASE) MCG/ACT inhaler Inhale 1-2 puffs into the lungs every 6 (six) hours as needed for wheezing. 07/01/12  Yes April K Palumbo-Rasch, MD  amLODipine (NORVASC) 10 MG tablet Take 10 mg by mouth every evening.   Yes Historical Provider, MD  chlorpheniramine-HYDROcodone (TUSSIONEX PENNKINETIC ER) 10-8 MG/5ML LQCR Take 5 mLs by mouth  every 12 (twelve) hours as needed (cough). 07/03/12  Yes John L Molpus, MD  cloNIDine (CATAPRES - DOSED IN MG/24 HR) 0.3 mg/24hr Place 1 patch onto the skin once a week. Change on Tuesday   Yes Historical Provider, MD  labetalol (NORMODYNE) 300 MG tablet Take 150 mg by mouth 2 (two) times daily.   Yes Historical Provider, MD  multivitamin (RENA-VIT) TABS tablet Take 1 tablet by mouth at bedtime. 03/14/11  Yes Cherene Altes, MD    Allergies:   Allergies  Allergen Reactions  . Codeine Shortness Of Breath    Social History:  reports that he has been smoking Cigarettes.  He has been smoking about 0.50 packs per day. He does not have any smokeless tobacco history on file. He reports that he does not drink alcohol or use illicit drugs.  Family History: neg  Physical Exam: Filed Vitals:   07/05/12 2115 07/05/12 2138 07/05/12 2145 07/06/12 0020  BP: 178/114  162/96 167/93  Pulse:   95 102  Temp:    99.2 F (37.3 C)  TempSrc:    Oral  Resp: 24  25 20   Weight:      SpO2: 98% 98% 98% 98%   General appearance: alert, cooperative and no distress Head: Normocephalic, without obvious abnormality, atraumatic Eyes: negative Nose: Nares normal. Septum midline. Mucosa normal. No drainage or sinus  tenderness. Neck: no JVD and supple, symmetrical, trachea midline Lungs: wheezes bilaterally Heart: regular rate and rhythm, S1, S2 normal, no murmur, click, rub or gallop Abdomen: soft, non-tender; bowel sounds normal; no masses,  no organomegaly Extremities: extremities normal, atraumatic, no cyanosis or edema Pulses: 2+ and symmetric Skin: Skin color, texture, turgor normal. No rashes or lesions Neurologic: Grossly normal    Labs on Admission:   Recent Labs  07/05/12 1950  NA 136  K 3.8  CL 94*  CO2 29  GLUCOSE 79  BUN 17  CREATININE 6.03*  CALCIUM 9.5    Recent Labs  07/05/12 1950  WBC 4.3  NEUTROABS 2.8  HGB 11.0*  HCT 35.8*  MCV 93.2  PLT 137*    Recent Labs   07/05/12 1950  TROPONINI <0.30   Radiological Exams on Admission: Dg Chest 2 View  07/05/2012   *RADIOLOGY REPORT*  Clinical Data: Short of breath.  Right-sided chest pain. Hypertension.  CHEST - 2 VIEW  Comparison: 07/03/2012  Findings: Artifact overlies the chest.  Cardiac silhouette remains enlarged.  The left lung is clear except for chronic interstitial markings.  Pleural density on the right with some associated patchy density in the right lower lung appears quite similar.  No new finding.  IMPRESSION: Persisting cardiomegaly.  Persistent pleural fluid on the right with persistent density in the right lower lung, much which probably represents scarring.  However, there could be some atelectasis and/or infiltrate at the right lung base.  No new finding.   Original Report Authenticated By: Nelson Chimes, M.D.    Assessment/Plan  52 yo male esrd, with mild copd exacerbation/uri  Principal Problem:   COPD with exacerbation Active Problems:   Essential hypertension, benign   Shortness of breath   ESRD (end stage renal disease) on dialysis   Pleural effusion, right chronic   Tobacco abuse  Cont nebs, po prednisone and zpack.  If spikes temp or develops higher wbc broaden abx.  May need home oxgyen.  Chronic rt pleural effusion is no change from prior.  No fluid overload at this time, had dailysis today.  Transfer to [facility] in case still here for his dialysis on Tuesday.  Tele bed.  DAVID,RACHAL A 07/06/2012, 1:29 AM

## 2012-07-07 ENCOUNTER — Encounter (HOSPITAL_COMMUNITY): Payer: Self-pay | Admitting: Nephrology

## 2012-07-07 DIAGNOSIS — N186 End stage renal disease: Secondary | ICD-10-CM | POA: Diagnosis not present

## 2012-07-07 DIAGNOSIS — D649 Anemia, unspecified: Secondary | ICD-10-CM

## 2012-07-07 DIAGNOSIS — I10 Essential (primary) hypertension: Secondary | ICD-10-CM

## 2012-07-07 LAB — RENAL FUNCTION PANEL
Albumin: 3.1 g/dL — ABNORMAL LOW (ref 3.5–5.2)
BUN: 75 mg/dL — ABNORMAL HIGH (ref 6–23)
CO2: 23 mEq/L (ref 19–32)
Calcium: 10.3 mg/dL (ref 8.4–10.5)
Chloride: 96 mEq/L (ref 96–112)
Creatinine, Ser: 11.44 mg/dL — ABNORMAL HIGH (ref 0.50–1.35)
GFR calc Af Amer: 5 mL/min — ABNORMAL LOW (ref >90)
GFR calc non Af Amer: 5 mL/min — ABNORMAL LOW (ref >90)
Glucose, Bld: 146 mg/dL — ABNORMAL HIGH (ref 70–99)
Phosphorus: 10.1 mg/dL — ABNORMAL HIGH (ref 2.3–4.6)
Potassium: 5.3 mEq/L — ABNORMAL HIGH (ref 3.5–5.1)
Sodium: 137 mEq/L (ref 135–145)

## 2012-07-07 LAB — CBC
HCT: 34 % — ABNORMAL LOW (ref 39.0–52.0)
Hemoglobin: 10.9 g/dL — ABNORMAL LOW (ref 13.0–17.0)
MCH: 29.5 pg (ref 26.0–34.0)
MCHC: 32.1 g/dL (ref 30.0–36.0)
MCV: 91.9 fL (ref 78.0–100.0)
Platelets: 155 10*3/uL (ref 150–400)
RBC: 3.7 MIL/uL — ABNORMAL LOW (ref 4.22–5.81)
RDW: 16.1 % — ABNORMAL HIGH (ref 11.5–15.5)
WBC: 7.4 10*3/uL (ref 4.0–10.5)

## 2012-07-07 MED ORDER — DOXERCALCIFEROL 4 MCG/2ML IV SOLN
INTRAVENOUS | Status: AC
  Filled 2012-07-07: qty 4

## 2012-07-07 MED ORDER — DOXERCALCIFEROL 4 MCG/2ML IV SOLN
8.0000 ug | Freq: Once | INTRAVENOUS | Status: AC
  Administered 2012-07-07: 8 ug via INTRAVENOUS
  Filled 2012-07-07: qty 4

## 2012-07-07 MED ORDER — DARBEPOETIN ALFA-POLYSORBATE 100 MCG/0.5ML IJ SOLN
INTRAMUSCULAR | Status: AC
  Filled 2012-07-07: qty 0.5

## 2012-07-07 MED ORDER — SODIUM CHLORIDE 0.9 % IV SOLN: 125.0000 mg | INTRAVENOUS | Status: DC

## 2012-07-07 MED ORDER — DARBEPOETIN ALFA-POLYSORBATE 100 MCG/0.5ML IJ SOLN
100.0000 ug | Freq: Once | INTRAMUSCULAR | Status: AC
  Administered 2012-07-07: 100 ug via INTRAVENOUS
  Filled 2012-07-07: qty 0.5

## 2012-07-07 MED ORDER — DOXERCALCIFEROL 4 MCG/2ML IV SOLN: 8.0000 ug | INTRAVENOUS | Status: DC

## 2012-07-07 MED ORDER — DARBEPOETIN ALFA-POLYSORBATE 100 MCG/0.5ML IJ SOLN: 100.0000 ug | INTRAMUSCULAR | Status: DC

## 2012-07-07 MED ORDER — METHYLPREDNISOLONE SODIUM SUCC 125 MG IJ SOLR
60.0000 mg | Freq: Three times a day (TID) | INTRAMUSCULAR | Status: DC
  Administered 2012-07-07 – 2012-07-08 (×3): 60 mg via INTRAVENOUS
  Filled 2012-07-07 (×6): qty 0.96

## 2012-07-07 MED ORDER — GUAIFENESIN ER 600 MG PO TB12
600.0000 mg | ORAL_TABLET | Freq: Two times a day (BID) | ORAL | Status: DC
  Administered 2012-07-07 – 2012-07-08 (×3): 600 mg via ORAL
  Filled 2012-07-07 (×5): qty 1

## 2012-07-07 NOTE — Progress Notes (Signed)
[facility] PROGRESS NOTE  TASEAN SEVILLA [id number] DOB: 12-04-1960 DOA: 07/05/2012 PCP: Windy Kalata, MD  Assessment/Plan: Acute COPD exacerbation  -Discontinued prednisone  -Start the patient on IV Solu-Medrol  -increase solumedrol to TID (07/07/12) -Start aerosolized albuterol and Atrovent  -Continue azithromycin  -Supplemental oxygen as necessary--walking desat to 90% (6/30) -add mucinex -continue cough suppressant prn ESRD  -Patient has been compliant with dialysis, Tuesday-Thursday, Saturday  -Consult nephrology for maintenance HD -Appears euvolemic to slight hypervolemic -History of failed renal transplant  Elevated proBNP  -Multifactorial including the patient's ESRD   Chronic right pleural effusion  -Status VATS with decortication August 2013  -Chest x-ray remained stable  Tobacco abuse  -20 pack year history  -Patient had continued to smoke  -Tobacco cessation discussed  -pt declined nicotine patch Hypertension  Continue amlodipine, labetalol, clonidine TTS 3  Family Communication: Pt at beside  Disposition Plan: Home when medically stable    Antibiotics:  Azithromycin 07/06/2012>>>        Procedures/Studies: Dg Chest 2 View  07/05/2012   *RADIOLOGY REPORT*  Clinical Data: Short of breath.  Right-sided chest pain. Hypertension.  CHEST - 2 VIEW  Comparison: 07/03/2012  Findings: Artifact overlies the chest.  Cardiac silhouette remains enlarged.  The left lung is clear except for chronic interstitial markings.  Pleural density on the right with some associated patchy density in the right lower lung appears quite similar.  No new finding.  IMPRESSION: Persisting cardiomegaly.  Persistent pleural fluid on the right with persistent density in the right lower lung, much which probably represents scarring.  However, there could be some atelectasis and/or infiltrate at the right lung base.  No new finding.   Original Report Authenticated By: Nelson Chimes, M.D.   Dg Chest 2 View  07/03/2012   *RADIOLOGY REPORT*  Clinical Data: Cough.  CHEST - 2 VIEW  Comparison: 07/01/2012  Findings: Stable small right pleural effusion with right lower lung airspace disease.  This is stable dating back to last December compatible with scarring.  Cardiomegaly.  Left lung is clear.  No acute bony abnormality.  IMPRESSION: Stable chronic changes on the right with chronic scarring in the right lung base and chronic right pleural thickening.   Original Report Authenticated By: Rolm Baptise, M.D.   Dg Chest 2 View  07/01/2012   *RADIOLOGY REPORT*  Clinical Data: Shortness of breath.  Hypertension.  CHEST - 2 VIEW  Comparison: 06/10/2012  Findings: Small chronic right pleural effusion, stable.  Chronic opacities in the right lower lung again noted, not significantly changed.  No focal opacity on the left.  Heart is mildly enlarged. Mediastinal contours are within normal limits.  No left effusion. No acute bony abnormality.  IMPRESSION: Stable chronic changes on the right with chronic right effusion and chronic densities in the right lung base.  No acute process.   Original Report Authenticated By: Rolm Baptise, M.D.   Dg Chest 2 View  06/10/2012   *RADIOLOGY REPORT*  Clinical Data: Shortness of breath.  CHEST - 2 VIEW  Comparison: Multiple priors, most recently chest x-ray 03/04/2012.  Findings: Small chronic right-sided pleural effusion is similar to the prior examination.  There is again architectural distortion and chronic opacities throughout the right mid to lower hemithorax, similar to prior studies, which appears to correspond with areas of chronic scarring and probable rounded atelectasis in the right lower lobe, based on comparison with prior chest CT 07/23/2011. Left lung appears clear.  No evidence of pulmonary edema.  Heart size appears mildly enlarged (unchanged).  Upper mediastinal contours are within normal limits.  Atherosclerosis in the thoracic aorta.   IMPRESSION: 1.  No significant change compared to the prior examination.  The radiographic appearance of the chest is very similar, as above, without evidence of acute cardiopulmonary disease.   Original Report Authenticated By: Vinnie Langton, M.D.         Subjective: Patient had a spell of coughing this morning which caused him to be short of breath. Otherwise he feels that his breathing is about 50% better. He denies fevers, chills, chest discomfort, nausea, vomiting, diarrhea, abdominal pain, dizziness.  Objective: Filed Vitals:   07/07/12 0706 07/07/12 0743 07/07/12 0915 07/07/12 0934  BP:   159/87   Pulse: 111  96   Temp:   98 F (36.7 C)   TempSrc:   Oral   Resp:   20   Height:      Weight:      SpO2: 99% 97% 96% 90%    Intake/Output Summary (Last 24 hours) at 07/07/12 1020 Last data filed at 07/06/12 1800  Gross per 24 hour  Intake    600 ml  Output      0 ml  Net    600 ml   Weight change: 3.201 kg (7 lb 0.9 oz) Exam:   General:  Pt is alert, follows commands appropriately, not in acute distress  HEENT: No icterus, No thrush, [place]/AT  Cardiovascular: RRR, S1/S2, no rubs, no gallops  Respiratory: Bilateral expiratory wheeze ; diminished breath sounds right base. Good air movement.  Abdomen: Soft/+BS, non tender, non distended, no guarding  Extremities: 1+LE edema, No lymphangitis, No petechiae, No rashes, no synovitis  Data Reviewed: Basic Metabolic Panel:  Recent Labs Lab 07/01/12 0210 07/05/12 1950  NA 139 136  K 4.6 3.8  CL 96 94*  CO2 24 29  GLUCOSE 165* 79  BUN 64* 17  CREATININE 12.30* 6.03*  CALCIUM 10.8* 9.5   Liver Function Tests: No results found for this basename: AST, ALT, ALKPHOS, BILITOT, PROT, ALBUMIN,  in the last 168 hours No results found for this basename: LIPASE, AMYLASE,  in the last 168 hours No results found for this basename: AMMONIA,  in the last 168 hours CBC:  Recent Labs Lab 07/01/12 0210 07/05/12 1950  WBC  6.2 4.3  NEUTROABS 4.0 2.8  HGB 10.7* 11.0*  HCT 33.2* 35.8*  MCV 95.4 93.2  PLT 165 137*   Cardiac Enzymes:  Recent Labs Lab 07/01/12 0210 07/05/12 1950  TROPONINI <0.30 <0.30   BNP: No components found with this basename: POCBNP,  CBG: No results found for this basename: GLUCAP,  in the last 168 hours  Recent Results (from the past 240 hour(s))  MRSA PCR SCREENING     Status: None   Collection Time    07/06/12  4:35 AM      Result Value Range Status   MRSA by PCR NEGATIVE  NEGATIVE Final   Comment:            The GeneXpert MRSA Assay (FDA     approved for NASAL specimens     only), is one component of a     comprehensive MRSA colonization     surveillance program. It is not     intended to diagnose MRSA     infection nor to guide or     monitor treatment for     MRSA infections.     Scheduled Meds: .  ipratropium  0.5 mg Nebulization QID   And  . albuterol  2.5 mg Nebulization QID  . amLODipine  10 mg Oral QPM  . azithromycin  250 mg Oral Daily  . cloNIDine  0.3 mg Transdermal Weekly  . guaiFENesin  600 mg Oral BID  . heparin  5,000 Units Subcutaneous Q8H  . labetalol  150 mg Oral BID  . methylPREDNISolone (SOLU-MEDROL) injection  60 mg Intravenous Q8H  . multivitamin  1 tablet Oral QHS  . sodium chloride  3 mL Intravenous Q12H  . sodium chloride  3 mL Intravenous Q12H   Continuous Infusions:    Caldwell Kronenberger, DO  [facility] Pager [phone]  If 7PM-7AM, please contact night-coverage [email or web address] Password [email or web address] 07/07/2012, 10:20 AM   LOS: 2 days

## 2012-07-07 NOTE — Procedures (Signed)
I was present at this dialysis session. I have reviewed the session itself and made appropriate changes.   Kelly Splinter, MD [employer] 07/07/2012, 7:36 PM

## 2012-07-07 NOTE — Progress Notes (Signed)
Patient ambulated in the hall on room air. Lowest pulse ox reading was 90%. Patient states no pain or shortness of breath.

## 2012-07-07 NOTE — Consult Note (Signed)
[facility] Renal Consultation Note    Indication for Consultation:  Management of ESRD/hemodialysis; anemia, hypertension/volume and secondary hyperparathyroidism  HPI: Nathaniel White is a 52 y.o. male with ESRD on TTS dialysis at [place] who has a hx of VATs with decortication for recurring right pleural effusion in August of 2013 who presents with worsening cough and SOB over the past two months in spite of several ED visits with interventions this month. He had not had any relief with his inhaler. He cannot tolerate cough syrup with codeine. Yesterday when seen in the ED he remained hypoxic after 2 neb tx and dose of decadron. Zithromax was given in the ED and he was subsequently admitted.  His EDW was recently lowered from 52 to 85 mid June and he was able to obtain this 6/28. Previously his EDW was  76.5 in May. He is still very SOB, coughing up thick, tenacious yellow sputum, but feels like he is breathing some better since admission. Denies fever and chills, N, V, D.  Has occasional constipation and makes a few drops of urine. + DOE, fatigue. Ankles have been swelling the past few days.  He is due for dialysis tomorrow.   Past Medical History  Diagnosis Date  . Hypertension   . Renal insufficiency   . Anemia   . Stroke   . Hemiparesis due to old stroke     left  . LV dysfunction     EF 25-30% by echo 07/2011  . MR (mitral regurgitation)     moderate to severe, echo 07/2011  . Shortness of breath   . Diabetes mellitus without complication    Past Surgical History  Procedure Laterality Date  . Kidney transplant      2011 rejected kidney 2012 back on dialysis  . Av fistula placement    . Cardiac catheterization      [place] without cardioversion  08/17/2011    Procedure: TRANSESOPHAGEAL ECHOCARDIOGRAM (TEE);  Surgeon: Larey Dresser, MD;  Location: [facility] ENDOSCOPY;  Service: Cardiovascular;  Laterality: N/A;   Family History  Problem Relation Age of  Onset  . Hypertension Mother   Father and sister - no health issues Social History:  Previously worked at [employer]; states two former girlfriends live with him.  reports that he has been smoking Cigarettes.  He has been smoking about 0.50 packs per day. He does not have any smokeless tobacco history on file. He reports that he does not drink alcohol or use illicit drugs. Allergies  Allergen Reactions  . Codeine Shortness Of Breath   Prior to Admission medications   Medication Sig Start Date End Date Taking? Authorizing Provider  albuterol (PROVENTIL HFA;VENTOLIN HFA) 108 (90 BASE) MCG/ACT inhaler Inhale 1-2 puffs into the lungs every 6 (six) hours as needed for wheezing. 07/01/12  Yes April K Palumbo-Rasch, MD  amLODipine (NORVASC) 10 MG tablet Take 10 mg by mouth every evening.   Yes Historical Provider, MD  chlorpheniramine-HYDROcodone (TUSSIONEX PENNKINETIC ER) 10-8 MG/5ML LQCR Take 5 mLs by mouth every 12 (twelve) hours as needed (cough). 07/03/12  Yes John L Molpus, MD  cloNIDine (CATAPRES - DOSED IN MG/24 HR) 0.3 mg/24hr Place 1 patch onto the skin once a week. Change on Tuesday   Yes Historical Provider, MD  labetalol (NORMODYNE) 300 MG tablet Take 150 mg by mouth 2 (two) times daily.   Yes Historical Provider, MD  multivitamin (RENA-VIT) TABS tablet Take 1 tablet by mouth  at bedtime. 03/14/11  Yes Cherene Altes, MD   Current Facility-Administered Medications  Medication Dose Route Frequency Provider Last Rate Last Dose  . 0.9 %  sodium chloride infusion  250 mL Intravenous PRN Derrill Kay, MD      . albuterol (PROVENTIL HFA;VENTOLIN HFA) 108 (90 BASE) MCG/ACT inhaler 1-2 puff  1-2 puff Inhalation Q6H PRN Derrill Kay, MD      . albuterol (PROVENTIL) (5 MG/ML) 0.5% nebulizer solution 2.5 mg  2.5 mg Nebulization Q2H PRN Derrill Kay, MD      . ipratropium (ATROVENT) nebulizer solution 0.5 mg  0.5 mg Nebulization QID Orson Eva, MD   0.5 mg at 07/07/12 1222   And  . albuterol (PROVENTIL)  (5 MG/ML) 0.5% nebulizer solution 2.5 mg  2.5 mg Nebulization QID Orson Eva, MD   2.5 mg at 07/07/12 1222  . amLODipine (NORVASC) tablet 10 mg  10 mg Oral QPM Derrill Kay, MD   10 mg at 07/06/12 1801  . azithromycin (ZITHROMAX) tablet 250 mg  250 mg Oral Daily Derrill Kay, MD   250 mg at 07/07/12 0945  . chlorpheniramine-HYDROcodone (TUSSIONEX) 10-8 MG/5ML suspension 5 mL  5 mL Oral Q12H PRN Derrill Kay, MD   5 mL at 07/07/12 0534  . cloNIDine (CATAPRES - Dosed in mg/24 hr) patch 0.3 mg  0.3 mg Transdermal Weekly Derrill Kay, MD   0.3 mg at 07/06/12 0640  . guaiFENesin (MUCINEX) 12 hr tablet 600 mg  600 mg Oral BID Orson Eva, MD   600 mg at 07/07/12 1135  . heparin injection 5,000 Units  5,000 Units Subcutaneous Q8H Derrill Kay, MD   5,000 Units at 07/07/12 1429  . labetalol (NORMODYNE) tablet 150 mg  150 mg Oral BID Derrill Kay, MD   150 mg at 07/07/12 0944  . methylPREDNISolone sodium succinate (SOLU-MEDROL) 125 mg/2 mL injection 60 mg  60 mg Intravenous Q8H Orson Eva, MD   60 mg at 07/07/12 1429  . multivitamin (RENA-VIT) tablet 1 tablet  1 tablet Oral QHS Derrill Kay, MD   1 tablet at 07/06/12 2121  . ondansetron (ZOFRAN) tablet 4 mg  4 mg Oral Q6H PRN Derrill Kay, MD       Or  . ondansetron [facility]) injection 4 mg  4 mg Intravenous Q6H PRN Derrill Kay, MD      . sodium chloride 0.9 % injection 3 mL  3 mL Intravenous Q12H Derrill Kay, MD      . sodium chloride 0.9 % injection 3 mL  3 mL Intravenous Q12H Derrill Kay, MD   3 mL at 07/07/12 0946  . sodium chloride 0.9 % injection 3 mL  3 mL Intravenous PRN Derrill Kay, MD       Labs: Basic Metabolic Panel:  Recent Labs Lab 07/01/12 0210 07/05/12 1950  NA 139 136  K 4.6 3.8  CL 96 94*  CO2 24 29  GLUCOSE 165* 79  BUN 64* 17  CREATININE 12.30* 6.03*  CALCIUM 10.8* 9.5  CBC:  Recent Labs Lab 07/01/12 0210 07/05/12 1950  WBC 6.2 4.3  NEUTROABS 4.0 2.8  HGB 10.7* 11.0*  HCT 33.2* 35.8*  MCV 95.4 93.2  PLT 165 137*    Cardiac Enzymes:  Recent Labs Lab 07/01/12 0210 07/05/12 1950  TROPONINI <0.30 <0.30  Studies/Results: Dg Chest 2 View  07/05/2012   *RADIOLOGY REPORT*  Clinical Data: Short of breath.  Right-sided chest pain. Hypertension.  CHEST - 2 VIEW  Comparison: 07/03/2012  Findings: Artifact overlies  the chest.  Cardiac silhouette remains enlarged.  The left lung is clear except for chronic interstitial markings.  Pleural density on the right with some associated patchy density in the right lower lung appears quite similar.  No new finding.  IMPRESSION: Persisting cardiomegaly.  Persistent pleural fluid on the right with persistent density in the right lower lung, much which probably represents scarring.  However, there could be some atelectasis and/or infiltrate at the right lung base.  No new finding.   Original Report Authenticated By: Nelson Chimes, M.D.   ROS: As per HPI otherwise negative.  Physical Exam: Filed Vitals:   07/07/12 0743 07/07/12 0915 07/07/12 0934 07/07/12 1406  BP:  159/87  156/87  Pulse:  96  95  Temp:  98 F (36.7 C)  97.8 F (36.6 C)  TempSrc:  Oral  Oral  Resp:  20  20  Height:      Weight:      SpO2: 97% 96% 90% 96%     General: Well developed,obviously SOB at rest, somewhat breathless when talking Head: Normocephalic, atraumatic, sclera non-icteric, mucus membranes are moist Neck: Supple. Neck veins prominent Lungs: bilateral wheezes with crackles; decreased BS right base Heart: RRR  Abdomen: Soft, non-tender, non-distended with normoactive bowel sounds. No rebound/guarding. No obvious abdominal masses. Lower extremities: 1+ edema left great toe deviation; thickened, elongated nails. Neuro: Alert and oriented X 3. Moves all extremities spontaneously. Psych:  Responds to questions appropriately with a normal affect. Dialysis Access:left upper AVF + bruit Skin: dry  Dialysis Orders: Center: AF TTS 3.75 hours Optiflux 180 450/A 1.5 2 K 2.25 Ca EDW 77 heparin  2200, left upper AVF; Hectorol 8, Epo just increased to 15K from 12k for Hgb 10, 20% sat - Venofer 100 x 5 to start 7/1 iPTH 488 - P consistantly 8-9; tums 400 tid (on med list - he says he takes 3 ac) and sensipar 30  Assessment/Plan: 1. Acute exacerbation of COPD with hx of chronic right pleural effusion s/p VATS with decortication 08/2011 - stress steroids, IV azithromycin, mininebs.  2. ESRD - TTS - plan Tuesday dialysis today 3. Hypertension/volume  -EDW recently lowered; continue to challenge volume - this may be playing a part; BP tend to be on the high side at his dialysis center pre and post. - continue labetolol clonidine TTS 3 and norvasc 10 - titrate EDW; I think he would benefit by having HD today instead of waiting until Tuesday Echo 08/2011 -EF 30-35% with mod MR. BNP > 70K 4. Anemia  - continue ESA equivalent and initiate course of IV Fe 5. Metabolic bone disease -  Poorly controlled P as an outpt - Increase binders 6. Nutrition - ^ protein renal diet; suspect losing weight due to chronic SOB 7. Tobacco abuse - ongoing even in the setting of #1 - per primary - reinforce tobacco cessation 8. NICM- EF 30-35% 2013 echo, diffuse hypokinesis  Myriam Jacobson, PA-C [place] [phone] 07/07/2012, 3:00 PM   Patient seen and examined.  Agree with assessment and plan as above.  SOB in patient w hx of chronic pleural effusion, s/pVATS R side, hx of NICM EF 30-35% and ESRD. CXR not convincing for edema, but exam suggestive of volume excess, so we are doing tomorrow's dialysis today to see if this helps with symptoms.  Kelly Splinter  MD [employer] Pager [phone]    Cell  [phone] 07/07/2012, 7:28 PM

## 2012-07-08 ENCOUNTER — Inpatient Hospital Stay (HOSPITAL_COMMUNITY): Payer: Medicare Other

## 2012-07-08 DIAGNOSIS — Z8673 Personal history of transient ischemic attack (TIA), and cerebral infarction without residual deficits: Secondary | ICD-10-CM

## 2012-07-08 LAB — RENAL FUNCTION PANEL
Albumin: 3.2 g/dL — ABNORMAL LOW (ref 3.5–5.2)
BUN: 49 mg/dL — ABNORMAL HIGH (ref 6–23)
CO2: 24 mEq/L (ref 19–32)
Calcium: 10 mg/dL (ref 8.4–10.5)
Chloride: 95 mEq/L — ABNORMAL LOW (ref 96–112)
Creatinine, Ser: 7.75 mg/dL — ABNORMAL HIGH (ref 0.50–1.35)
GFR calc Af Amer: 8 mL/min — ABNORMAL LOW (ref >90)
GFR calc non Af Amer: 7 mL/min — ABNORMAL LOW (ref >90)
Glucose, Bld: 159 mg/dL — ABNORMAL HIGH (ref 70–99)
Phosphorus: 8.5 mg/dL — ABNORMAL HIGH (ref 2.3–4.6)
Potassium: 4.4 mEq/L (ref 3.5–5.1)
Sodium: 137 mEq/L (ref 135–145)

## 2012-07-08 LAB — CBC
HCT: 36.5 % — ABNORMAL LOW (ref 39.0–52.0)
Hemoglobin: 11.6 g/dL — ABNORMAL LOW (ref 13.0–17.0)
MCH: 29.7 pg (ref 26.0–34.0)
MCHC: 31.8 g/dL (ref 30.0–36.0)
MCV: 93.4 fL (ref 78.0–100.0)
Platelets: 170 10*3/uL (ref 150–400)
RBC: 3.91 MIL/uL — ABNORMAL LOW (ref 4.22–5.81)
RDW: 16.5 % — ABNORMAL HIGH (ref 11.5–15.5)
WBC: 7.5 10*3/uL (ref 4.0–10.5)

## 2012-07-08 MED ORDER — LANTHANUM CARBONATE 500 MG PO CHEW
1000.0000 mg | CHEWABLE_TABLET | Freq: Three times a day (TID) | ORAL | Status: DC
  Administered 2012-07-08: 1000 mg via ORAL
  Filled 2012-07-08 (×3): qty 2

## 2012-07-08 MED ORDER — IPRATROPIUM BROMIDE 0.02 % IN SOLN: 0.5000 mg | Freq: Two times a day (BID) | RESPIRATORY_TRACT | Status: DC

## 2012-07-08 MED ORDER — AZITHROMYCIN 250 MG PO TABS: ORAL_TABLET | ORAL | Status: DC

## 2012-07-08 MED ORDER — PREDNISONE 10 MG PO TABS: ORAL_TABLET | ORAL | Status: DC

## 2012-07-08 MED ORDER — ALBUTEROL SULFATE (5 MG/ML) 0.5% IN NEBU: 2.5000 mg | INHALATION_SOLUTION | Freq: Two times a day (BID) | RESPIRATORY_TRACT | Status: DC

## 2012-07-08 NOTE — Discharge Summary (Signed)
Physician Discharge Summary  Nathaniel White [id number] DOB: [birth year] DOA: 07/05/2012  PCP: Windy Kalata, MD  Admit date: 07/05/2012 Discharge date: 07/08/2012  Recommendations for Outpatient Follow-up:  1. Pt will need to follow up with PCP in 2 weeks post discharge 2. Please obtain BMP to evaluate electrolytes and kidney function 3. Please also check CBC to evaluate Hg and Hct levels   Discharge Diagnoses:  Principal Problem:   COPD with exacerbation Active Problems:   Essential hypertension, benign   Shortness of breath   ESRD (end stage renal disease) on dialysis   Pleural effusion, right chronic   Tobacco abuse  Acute COPD exacerbation  -Patient was initially started on prednisone, but he continued to have wheezing -Started the patient on IV Solu-Medrol  -increased solumedrol to TID (07/07/12)  -Start aerosolized albuterol and Atrovent  -Continue azithromycin--he will complete 2 additional days to complete 5days total -Supplemental oxygen as necessary--walking no desat on day of d/c -The patient will be discharged home with prednisone 60 mg starting 07/09/2012--he will decrease by one tablet daily until he is finished. -continue cough suppressant prn  -On the day of discharge, the patient did not have any further wheezing and he was breathing much better on room air ESRD  -Patient has been compliant with dialysis, Tuesday-Thursday, Saturday  -Consult nephrology for maintenance HD--patient had dialysis on 07/07/2012 -The nephrology service was contacted and notified of the patient's discharge on 07/08/2012 -Appears euvolemic to slight hypervolemic  -History of failed renal transplant  Elevated proBNP  -Multifactorial including the patient's ESRD  -Was not clinically fluid overloaded Chronic right pleural effusion  -Status VATS with decortication August 2013  -Chest x-ray remained stable  Tobacco abuse  -20 pack year history  -Patient had continued to smoke   -Tobacco cessation discussed  -pt declined nicotine patch  Hypertension  Continue amlodipine, labetalol, clonidine TTS 3  Family Communication: Pt at beside  Disposition Plan: Home when medically stable  Antibiotics:  Azithromycin 07/06/2012>>>  Discharge Condition: stable  Disposition: home  Diet:renal Wt Readings from Last 3 Encounters:  07/07/12 75.201 kg (165 lb 12.6 oz)  07/01/12 81.647 kg (180 lb)  06/10/12 81.647 kg (180 lb)    History of present illness:  52 year old male with a history of ESRD with rejected kidney transplant, tobacco abuse, hypertension, chronic right-sided pleural effusion presented with 3 weeks of worsening shortness of breath. He has normally a small amount of lower extremity edema which has been unchanged. He denies any orthopnea or PND. He was compliant with dialysis. He also complains of increasing cough with white sputum production. There was no fevers or chills. There is no nausea, vomiting, diarrhea, abdominal pain, chest pain. In emergency department, the patient was given aerosolized albuterol and Solu-Medrol. He continued to have wheezing. His oxygen saturations dropped to 82%. He was admitted for COPD exacerbation. The patient was continued on intravenous steroids. He continued to improve clinically. He was weaned off of supplemental oxygen. He did not have any option desaturation on ambulation. He was continued on azithromycin. He'll be discharged with 2 additional days of azithromycin and one week wean the prednisone by mouth.    Consultants: nephrology  Discharge Exam: Filed Vitals:   07/08/12 0935  BP: 161/97  Pulse: 94  Temp: 98.4 F (36.9 C)  Resp: 22   Filed Vitals:   07/07/12 2223 07/08/12 0437 07/08/12 0927 07/08/12 0935  BP: 149/90 157/85  161/97  Pulse: 99 92  94  Temp: 98.8 F (37.1 C)  98.7 F (37.1 C)  98.4 F (36.9 C)  TempSrc: Oral Oral    Resp: 21 21  22   Height:      Weight: 75.201 kg (165 lb 12.6 oz)     SpO2:  96% 96% 96% 98%   General: A&O x 3, NAD, pleasant, cooperative Cardiovascular: RRR, no rub, no gallop, no S3 Respiratory: Diminished breath sounds right base. Bibasilar crackles. No wheezes. Abdomen:soft, nontender, nondistended, positive bowel sounds Extremities: trace LE edema, No lymphangitis, no petechiae  Discharge Instructions     Medication List         albuterol 108 (90 BASE) MCG/ACT inhaler  Commonly known as:  PROVENTIL HFA;VENTOLIN HFA  Inhale 1-2 puffs into the lungs every 6 (six) hours as needed for wheezing.     amLODipine 10 MG tablet  Commonly known as:  NORVASC  Take 10 mg by mouth every evening.     azithromycin 250 MG tablet  Commonly known as:  ZITHROMAX  Take one tablet daily starting 07/09/12     chlorpheniramine-HYDROcodone 10-8 MG/5ML Lqcr  Commonly known as:  TUSSIONEX PENNKINETIC ER  Take 5 mLs by mouth every 12 (twelve) hours as needed (cough).     cloNIDine 0.3 mg/24hr  Commonly known as:  CATAPRES - Dosed in mg/24 hr  Place 1 patch onto the skin once a week. Change on Tuesday     labetalol 300 MG tablet  Commonly known as:  NORMODYNE  Take 150 mg by mouth 2 (two) times daily.     multivitamin Tabs tablet  Take 1 tablet by mouth at bedtime.     predniSONE 10 MG tablet  Commonly known as:  DELTASONE  Take 60mg  (6 tablets) starting 07/09/12 and decrease by one tablet daily until gone         The results of significant diagnostics from this hospitalization (including imaging, microbiology, ancillary and laboratory) are listed below for reference.    Significant Diagnostic Studies: Dg Chest 2 View  07/05/2012   *RADIOLOGY REPORT*  Clinical Data: Short of breath.  Right-sided chest pain. Hypertension.  CHEST - 2 VIEW  Comparison: 07/03/2012  Findings: Artifact overlies the chest.  Cardiac silhouette remains enlarged.  The left lung is clear except for chronic interstitial markings.  Pleural density on the right with some associated patchy  density in the right lower lung appears quite similar.  No new finding.  IMPRESSION: Persisting cardiomegaly.  Persistent pleural fluid on the right with persistent density in the right lower lung, much which probably represents scarring.  However, there could be some atelectasis and/or infiltrate at the right lung base.  No new finding.   Original Report Authenticated By: Nelson Chimes, M.D.   Dg Chest 2 View  07/03/2012   *RADIOLOGY REPORT*  Clinical Data: Cough.  CHEST - 2 VIEW  Comparison: 07/01/2012  Findings: Stable small right pleural effusion with right lower lung airspace disease.  This is stable dating back to last December compatible with scarring.  Cardiomegaly.  Left lung is clear.  No acute bony abnormality.  IMPRESSION: Stable chronic changes on the right with chronic scarring in the right lung base and chronic right pleural thickening.   Original Report Authenticated By: Rolm Baptise, M.D.   Dg Chest 2 View  07/01/2012   *RADIOLOGY REPORT*  Clinical Data: Shortness of breath.  Hypertension.  CHEST - 2 VIEW  Comparison: 06/10/2012  Findings: Small chronic right pleural effusion, stable.  Chronic opacities in the right lower lung again noted, not  significantly changed.  No focal opacity on the left.  Heart is mildly enlarged. Mediastinal contours are within normal limits.  No left effusion. No acute bony abnormality.  IMPRESSION: Stable chronic changes on the right with chronic right effusion and chronic densities in the right lung base.  No acute process.   Original Report Authenticated By: Rolm Baptise, M.D.   Dg Chest 2 View  06/10/2012   *RADIOLOGY REPORT*  Clinical Data: Shortness of breath.  CHEST - 2 VIEW  Comparison: Multiple priors, most recently chest x-ray 03/04/2012.  Findings: Small chronic right-sided pleural effusion is similar to the prior examination.  There is again architectural distortion and chronic opacities throughout the right mid to lower hemithorax, similar to prior  studies, which appears to correspond with areas of chronic scarring and probable rounded atelectasis in the right lower lobe, based on comparison with prior chest CT 07/23/2011. Left lung appears clear.  No evidence of pulmonary edema.  Heart size appears mildly enlarged (unchanged).  Upper mediastinal contours are within normal limits.  Atherosclerosis in the thoracic aorta.  IMPRESSION: 1.  No significant change compared to the prior examination.  The radiographic appearance of the chest is very similar, as above, without evidence of acute cardiopulmonary disease.   Original Report Authenticated By: Vinnie Langton, M.D.     Microbiology: Recent Results (from the past 240 hour(s))  MRSA PCR SCREENING     Status: None   Collection Time    07/06/12  4:35 AM      Result Value Range Status   MRSA by PCR NEGATIVE  NEGATIVE Final   Comment:            The GeneXpert MRSA Assay (FDA     approved for NASAL specimens     only), is one component of a     comprehensive MRSA colonization     surveillance program. It is not     intended to diagnose MRSA     infection nor to guide or     monitor treatment for     MRSA infections.     Labs: Basic Metabolic Panel:  Recent Labs Lab 07/05/12 1950 07/07/12 1845  NA 136 137  K 3.8 5.3*  CL 94* 96  CO2 29 23  GLUCOSE 79 146*  BUN 17 75*  CREATININE 6.03* 11.44*  CALCIUM 9.5 10.3  PHOS  --  10.1*   Liver Function Tests:  Recent Labs Lab 07/07/12 1845  ALBUMIN 3.1*   No results found for this basename: LIPASE, AMYLASE,  in the last 168 hours No results found for this basename: AMMONIA,  in the last 168 hours CBC:  Recent Labs Lab 07/05/12 1950 07/07/12 1845 07/08/12 1155  WBC 4.3 7.4 7.5  NEUTROABS 2.8  --   --   HGB 11.0* 10.9* 11.6*  HCT 35.8* 34.0* 36.5*  MCV 93.2 91.9 93.4  PLT 137* 155 170   Cardiac Enzymes:  Recent Labs Lab 07/05/12 1950  TROPONINI <0.30   BNP: No components found with this basename: POCBNP,   CBG: No results found for this basename: GLUCAP,  in the last 168 hours  Time coordinating discharge:  Greater than 30 minutes  Signed:  Zhane Bluitt, DO [facility] Pager: [phone] 07/08/2012, 1:03 PM

## 2012-07-08 NOTE — Progress Notes (Signed)
Patient discharged home with friend. Patient was given discharge instructions and information on where to pick up prescriptions. Patient was told how to taper prednisone. Patient was told to call doctor with questions and concerns. Patient was stable upon discharge.

## 2012-07-08 NOTE — Progress Notes (Signed)
[facility] Progress Note  Subjective:   Breathing better. Cramped only at the end of HD. Coughing, but can't get it up.  Objective Filed Vitals:   07/07/12 2130 07/07/12 2156 07/07/12 2223 07/08/12 0437  BP: 148/93 167/99 149/90 157/85  Pulse: 100 98 99 92  Temp:  98.9 F (37.2 C) 98.8 F (37.1 C) 98.7 F (37.1 C)  TempSrc:  Oral Oral Oral  Resp: 28 21 21 21   Height:      Weight:  75.2 kg (165 lb 12.6 oz) 75.201 kg (165 lb 12.6 oz)   SpO2:  94% 96% 96%   Physical Exam General: talking on the phone, still a little breathless when talking Heart: tachy reg Lungs: decreased right base; coarse BS left base Abdomen: soft Extremities: tr LE edema Dialysis Access:left upper AVF + bruit   Dialysis Orders: Center: AF TTS 3.75 hours Optiflux 180 450/A 1.5 2 K 2.25 Ca EDW 77 heparin 2200, left upper AVF; Hectorol 8, Epo just increased to 15K from 12k for Hgb 10, 20% sat - Venofer 100 x 5 to start 7/1 iPTH 488 - P consistantly 8-9; tums 400 tid (on med list - he says he takes 3 ac) and sensipar 30  Assessment/Plan:  1. Acute exacerbation of COPD with hx of chronic right pleural effusion s/p VATS with decortication 08/2011 - stress steroids, IV azithromycin, mininebs.  2. ESRD - TTS - Had HD Monday - next HD Wed 3. Hypertension/volume -pre HD weight yesterday 79.8 - post 75.1 w/ net UF 2.8 yet 4.7 kg calculated weight difference -- EDW is 77; continue to challenge volume( he said pre and post HD wts were both standing.- continue to titrate edw 4. Anemia - continue ESA equivalent and initiate course of IV Fe. Hgb 10.9 - higher than outpt; if continues to be high, need to change Aranesp to lower dose or hold for next dose 5. Metabolic bone disease - Poorly controlled P as an outpt - Increase binders- P 10 - I'm fairly certain he was not taking as Rx - will start fosrenol 1 gm ac due to high Ca - hectorol 8, if P doesn't come down, need to hold; still does not have sensipar. He  doesn't recall ever trying fosrenol - due to his CVA he confesses to not being able to remember everything. 6. Nutrition - ^ protein renal diet; suspect losing weight due to chronic SOB 7. Tobacco abuse - ongoing even in the setting of #1 - per primary - reinforce tobacco cessation 8. NICM- EF 30-35% 2013 echo, diffuse hypokinesis  Myriam Jacobson, PA-C [facility] Beeper [phone] 07/08/2012,8:21 AM  LOS: 3 days   Patient seen and examined.  Agree with assessment and plan as above.  Will repeat CXR to f/ u changes. Unsure whether volume component to SOB or not, cramped yest when pulled below EDW.  Kelly Splinter  MD [employer] Pager [phone]    Cell  [phone] 07/08/2012, 12:12 PM   Additional Objective Labs: Basic Metabolic Panel:  Recent Labs Lab 07/05/12 1950 07/07/12 1845  NA 136 137  K 3.8 5.3*  CL 94* 96  CO2 29 23  GLUCOSE 79 146*  BUN 17 75*  CREATININE 6.03* 11.44*  CALCIUM 9.5 10.3  PHOS  --  10.1*   Liver Function Tests:  Recent Labs Lab 07/07/12 1845  ALBUMIN 3.1*   CBC:  Recent Labs Lab 07/05/12 1950 07/07/12 1845  WBC 4.3 7.4  NEUTROABS 2.8  --  HGB 11.0* 10.9*  HCT 35.8* 34.0*  MCV 93.2 91.9  PLT 137* 155  Cardiac Enzymes: Medications:   . ipratropium  0.5 mg Nebulization QID   And  . albuterol  2.5 mg Nebulization QID  . amLODipine  10 mg Oral QPM  . azithromycin  250 mg Oral Daily  . cloNIDine  0.3 mg Transdermal Weekly  . [START ON 07/15/2012] darbepoetin (ARANESP) injection - DIALYSIS  100 mcg Intravenous Q Tue-HD  . [START ON 07/10/2012] doxercalciferol  8 mcg Intravenous Q T,Th,Sa-HD  . [START ON 07/10/2012] ferric gluconate (FERRLECIT/NULECIT) IV  125 mg Intravenous Q T,Th,Sa-HD  . guaiFENesin  600 mg Oral BID  . heparin  5,000 Units Subcutaneous Q8H  . labetalol  150 mg Oral BID  . methylPREDNISolone (SOLU-MEDROL) injection  60 mg Intravenous Q8H  . multivitamin  1 tablet Oral QHS  . sodium  chloride  3 mL Intravenous Q12H  . sodium chloride  3 mL Intravenous Q12H

## 2012-07-10 DIAGNOSIS — N039 Chronic nephritic syndrome with unspecified morphologic changes: Secondary | ICD-10-CM | POA: Diagnosis not present

## 2012-07-10 DIAGNOSIS — N2581 Secondary hyperparathyroidism of renal origin: Secondary | ICD-10-CM | POA: Diagnosis not present

## 2012-07-10 DIAGNOSIS — E8779 Other fluid overload: Secondary | ICD-10-CM | POA: Diagnosis not present

## 2012-07-10 DIAGNOSIS — D631 Anemia in chronic kidney disease: Secondary | ICD-10-CM | POA: Diagnosis not present

## 2012-07-10 DIAGNOSIS — D509 Iron deficiency anemia, unspecified: Secondary | ICD-10-CM | POA: Diagnosis not present

## 2012-07-10 DIAGNOSIS — N186 End stage renal disease: Secondary | ICD-10-CM | POA: Diagnosis not present

## 2012-07-12 DIAGNOSIS — E8779 Other fluid overload: Secondary | ICD-10-CM | POA: Diagnosis not present

## 2012-07-12 DIAGNOSIS — N2581 Secondary hyperparathyroidism of renal origin: Secondary | ICD-10-CM | POA: Diagnosis not present

## 2012-07-12 DIAGNOSIS — N039 Chronic nephritic syndrome with unspecified morphologic changes: Secondary | ICD-10-CM | POA: Diagnosis not present

## 2012-07-12 DIAGNOSIS — N186 End stage renal disease: Secondary | ICD-10-CM | POA: Diagnosis not present

## 2012-07-12 DIAGNOSIS — D509 Iron deficiency anemia, unspecified: Secondary | ICD-10-CM | POA: Diagnosis not present

## 2012-07-15 DIAGNOSIS — E8779 Other fluid overload: Secondary | ICD-10-CM | POA: Diagnosis not present

## 2012-07-15 DIAGNOSIS — N186 End stage renal disease: Secondary | ICD-10-CM | POA: Diagnosis not present

## 2012-07-15 DIAGNOSIS — D509 Iron deficiency anemia, unspecified: Secondary | ICD-10-CM | POA: Diagnosis not present

## 2012-07-15 DIAGNOSIS — N039 Chronic nephritic syndrome with unspecified morphologic changes: Secondary | ICD-10-CM | POA: Diagnosis not present

## 2012-07-15 DIAGNOSIS — N2581 Secondary hyperparathyroidism of renal origin: Secondary | ICD-10-CM | POA: Diagnosis not present

## 2012-07-17 DIAGNOSIS — N2581 Secondary hyperparathyroidism of renal origin: Secondary | ICD-10-CM | POA: Diagnosis not present

## 2012-07-17 DIAGNOSIS — D509 Iron deficiency anemia, unspecified: Secondary | ICD-10-CM | POA: Diagnosis not present

## 2012-07-17 DIAGNOSIS — N186 End stage renal disease: Secondary | ICD-10-CM | POA: Diagnosis not present

## 2012-07-17 DIAGNOSIS — E8779 Other fluid overload: Secondary | ICD-10-CM | POA: Diagnosis not present

## 2012-07-17 DIAGNOSIS — N039 Chronic nephritic syndrome with unspecified morphologic changes: Secondary | ICD-10-CM | POA: Diagnosis not present

## 2012-07-19 DIAGNOSIS — E8779 Other fluid overload: Secondary | ICD-10-CM | POA: Diagnosis not present

## 2012-07-19 DIAGNOSIS — N186 End stage renal disease: Secondary | ICD-10-CM | POA: Diagnosis not present

## 2012-07-19 DIAGNOSIS — N2581 Secondary hyperparathyroidism of renal origin: Secondary | ICD-10-CM | POA: Diagnosis not present

## 2012-07-19 DIAGNOSIS — D509 Iron deficiency anemia, unspecified: Secondary | ICD-10-CM | POA: Diagnosis not present

## 2012-07-19 DIAGNOSIS — D631 Anemia in chronic kidney disease: Secondary | ICD-10-CM | POA: Diagnosis not present

## 2012-07-22 DIAGNOSIS — E8779 Other fluid overload: Secondary | ICD-10-CM | POA: Diagnosis not present

## 2012-07-22 DIAGNOSIS — D631 Anemia in chronic kidney disease: Secondary | ICD-10-CM | POA: Diagnosis not present

## 2012-07-22 DIAGNOSIS — N2581 Secondary hyperparathyroidism of renal origin: Secondary | ICD-10-CM | POA: Diagnosis not present

## 2012-07-22 DIAGNOSIS — D509 Iron deficiency anemia, unspecified: Secondary | ICD-10-CM | POA: Diagnosis not present

## 2012-07-22 DIAGNOSIS — N186 End stage renal disease: Secondary | ICD-10-CM | POA: Diagnosis not present

## 2012-07-23 DIAGNOSIS — N186 End stage renal disease: Secondary | ICD-10-CM | POA: Diagnosis not present

## 2012-07-23 DIAGNOSIS — E8779 Other fluid overload: Secondary | ICD-10-CM | POA: Diagnosis not present

## 2012-07-23 DIAGNOSIS — N039 Chronic nephritic syndrome with unspecified morphologic changes: Secondary | ICD-10-CM | POA: Diagnosis not present

## 2012-07-23 DIAGNOSIS — N2581 Secondary hyperparathyroidism of renal origin: Secondary | ICD-10-CM | POA: Diagnosis not present

## 2012-07-23 DIAGNOSIS — D509 Iron deficiency anemia, unspecified: Secondary | ICD-10-CM | POA: Diagnosis not present

## 2012-07-24 DIAGNOSIS — D631 Anemia in chronic kidney disease: Secondary | ICD-10-CM | POA: Diagnosis not present

## 2012-07-24 DIAGNOSIS — D509 Iron deficiency anemia, unspecified: Secondary | ICD-10-CM | POA: Diagnosis not present

## 2012-07-24 DIAGNOSIS — E8779 Other fluid overload: Secondary | ICD-10-CM | POA: Diagnosis not present

## 2012-07-24 DIAGNOSIS — N2581 Secondary hyperparathyroidism of renal origin: Secondary | ICD-10-CM | POA: Diagnosis not present

## 2012-07-24 DIAGNOSIS — N186 End stage renal disease: Secondary | ICD-10-CM | POA: Diagnosis not present

## 2012-07-26 DIAGNOSIS — E8779 Other fluid overload: Secondary | ICD-10-CM | POA: Diagnosis not present

## 2012-07-26 DIAGNOSIS — N2581 Secondary hyperparathyroidism of renal origin: Secondary | ICD-10-CM | POA: Diagnosis not present

## 2012-07-26 DIAGNOSIS — D631 Anemia in chronic kidney disease: Secondary | ICD-10-CM | POA: Diagnosis not present

## 2012-07-26 DIAGNOSIS — D509 Iron deficiency anemia, unspecified: Secondary | ICD-10-CM | POA: Diagnosis not present

## 2012-07-26 DIAGNOSIS — N186 End stage renal disease: Secondary | ICD-10-CM | POA: Diagnosis not present

## 2012-07-29 DIAGNOSIS — N2581 Secondary hyperparathyroidism of renal origin: Secondary | ICD-10-CM | POA: Diagnosis not present

## 2012-07-29 DIAGNOSIS — N186 End stage renal disease: Secondary | ICD-10-CM | POA: Diagnosis not present

## 2012-07-29 DIAGNOSIS — N039 Chronic nephritic syndrome with unspecified morphologic changes: Secondary | ICD-10-CM | POA: Diagnosis not present

## 2012-07-29 DIAGNOSIS — E8779 Other fluid overload: Secondary | ICD-10-CM | POA: Diagnosis not present

## 2012-07-29 DIAGNOSIS — D509 Iron deficiency anemia, unspecified: Secondary | ICD-10-CM | POA: Diagnosis not present

## 2012-07-31 DIAGNOSIS — D509 Iron deficiency anemia, unspecified: Secondary | ICD-10-CM | POA: Diagnosis not present

## 2012-07-31 DIAGNOSIS — N186 End stage renal disease: Secondary | ICD-10-CM | POA: Diagnosis not present

## 2012-07-31 DIAGNOSIS — E8779 Other fluid overload: Secondary | ICD-10-CM | POA: Diagnosis not present

## 2012-07-31 DIAGNOSIS — N039 Chronic nephritic syndrome with unspecified morphologic changes: Secondary | ICD-10-CM | POA: Diagnosis not present

## 2012-07-31 DIAGNOSIS — N2581 Secondary hyperparathyroidism of renal origin: Secondary | ICD-10-CM | POA: Diagnosis not present

## 2012-08-02 DIAGNOSIS — N2581 Secondary hyperparathyroidism of renal origin: Secondary | ICD-10-CM | POA: Diagnosis not present

## 2012-08-02 DIAGNOSIS — N186 End stage renal disease: Secondary | ICD-10-CM | POA: Diagnosis not present

## 2012-08-02 DIAGNOSIS — E8779 Other fluid overload: Secondary | ICD-10-CM | POA: Diagnosis not present

## 2012-08-02 DIAGNOSIS — D509 Iron deficiency anemia, unspecified: Secondary | ICD-10-CM | POA: Diagnosis not present

## 2012-08-02 DIAGNOSIS — N039 Chronic nephritic syndrome with unspecified morphologic changes: Secondary | ICD-10-CM | POA: Diagnosis not present

## 2012-08-05 DIAGNOSIS — N2581 Secondary hyperparathyroidism of renal origin: Secondary | ICD-10-CM | POA: Diagnosis not present

## 2012-08-05 DIAGNOSIS — D509 Iron deficiency anemia, unspecified: Secondary | ICD-10-CM | POA: Diagnosis not present

## 2012-08-05 DIAGNOSIS — N186 End stage renal disease: Secondary | ICD-10-CM | POA: Diagnosis not present

## 2012-08-05 DIAGNOSIS — E8779 Other fluid overload: Secondary | ICD-10-CM | POA: Diagnosis not present

## 2012-08-05 DIAGNOSIS — D631 Anemia in chronic kidney disease: Secondary | ICD-10-CM | POA: Diagnosis not present

## 2012-08-07 DIAGNOSIS — E440 Moderate protein-calorie malnutrition: Secondary | ICD-10-CM | POA: Insufficient documentation

## 2012-08-07 DIAGNOSIS — E8779 Other fluid overload: Secondary | ICD-10-CM | POA: Diagnosis not present

## 2012-08-07 DIAGNOSIS — N2581 Secondary hyperparathyroidism of renal origin: Secondary | ICD-10-CM | POA: Diagnosis not present

## 2012-08-07 DIAGNOSIS — N186 End stage renal disease: Secondary | ICD-10-CM | POA: Diagnosis not present

## 2012-08-07 DIAGNOSIS — D631 Anemia in chronic kidney disease: Secondary | ICD-10-CM | POA: Diagnosis not present

## 2012-08-07 DIAGNOSIS — D509 Iron deficiency anemia, unspecified: Secondary | ICD-10-CM | POA: Diagnosis not present

## 2012-08-09 DIAGNOSIS — N186 End stage renal disease: Secondary | ICD-10-CM | POA: Diagnosis not present

## 2012-08-09 DIAGNOSIS — D509 Iron deficiency anemia, unspecified: Secondary | ICD-10-CM | POA: Diagnosis not present

## 2012-08-09 DIAGNOSIS — E8779 Other fluid overload: Secondary | ICD-10-CM | POA: Diagnosis not present

## 2012-08-09 DIAGNOSIS — D631 Anemia in chronic kidney disease: Secondary | ICD-10-CM | POA: Diagnosis not present

## 2012-08-09 DIAGNOSIS — N2581 Secondary hyperparathyroidism of renal origin: Secondary | ICD-10-CM | POA: Diagnosis not present

## 2012-08-11 ENCOUNTER — Encounter (HOSPITAL_BASED_OUTPATIENT_CLINIC_OR_DEPARTMENT_OTHER): Payer: Self-pay | Admitting: *Deleted

## 2012-08-11 ENCOUNTER — Emergency Department (HOSPITAL_BASED_OUTPATIENT_CLINIC_OR_DEPARTMENT_OTHER)
Admission: EM | Admit: 2012-08-11 | Discharge: 2012-08-12 | Disposition: A | Discharge status: Home / Self Care | Payer: Medicare Other | Attending: Emergency Medicine | Admitting: Emergency Medicine

## 2012-08-11 DIAGNOSIS — N186 End stage renal disease: Secondary | ICD-10-CM

## 2012-08-11 DIAGNOSIS — F17200 Nicotine dependence, unspecified, uncomplicated: Secondary | ICD-10-CM | POA: Diagnosis not present

## 2012-08-11 DIAGNOSIS — Z992 Dependence on renal dialysis: Secondary | ICD-10-CM | POA: Insufficient documentation

## 2012-08-11 DIAGNOSIS — Z79899 Other long term (current) drug therapy: Secondary | ICD-10-CM | POA: Insufficient documentation

## 2012-08-11 DIAGNOSIS — Z9861 Coronary angioplasty status: Secondary | ICD-10-CM | POA: Insufficient documentation

## 2012-08-11 DIAGNOSIS — Z8679 Personal history of other diseases of the circulatory system: Secondary | ICD-10-CM | POA: Insufficient documentation

## 2012-08-11 DIAGNOSIS — Z8673 Personal history of transient ischemic attack (TIA), and cerebral infarction without residual deficits: Secondary | ICD-10-CM | POA: Diagnosis not present

## 2012-08-11 DIAGNOSIS — R0602 Shortness of breath: Secondary | ICD-10-CM | POA: Diagnosis not present

## 2012-08-11 DIAGNOSIS — I120 Hypertensive chronic kidney disease with stage 5 chronic kidney disease or end stage renal disease: Secondary | ICD-10-CM | POA: Diagnosis not present

## 2012-08-11 DIAGNOSIS — Z87448 Personal history of other diseases of urinary system: Secondary | ICD-10-CM | POA: Diagnosis not present

## 2012-08-11 DIAGNOSIS — E119 Type 2 diabetes mellitus without complications: Secondary | ICD-10-CM | POA: Insufficient documentation

## 2012-08-11 DIAGNOSIS — I129 Hypertensive chronic kidney disease with stage 1 through stage 4 chronic kidney disease, or unspecified chronic kidney disease: Secondary | ICD-10-CM | POA: Insufficient documentation

## 2012-08-11 DIAGNOSIS — Z862 Personal history of diseases of the blood and blood-forming organs and certain disorders involving the immune mechanism: Secondary | ICD-10-CM | POA: Diagnosis not present

## 2012-08-11 DIAGNOSIS — J398 Other specified diseases of upper respiratory tract: Secondary | ICD-10-CM | POA: Diagnosis not present

## 2012-08-11 NOTE — ED Provider Notes (Signed)
History  This chart was scribed for Rhunette Croft, MD by Mikel Cella, ED Scribe. This patient was seen in room MH12/MH12 and the patient's care was started at 2342.  CSN: [id number]     Arrival date & time 08/11/12  2317  First MD Initiated Contact with Patient 08/11/12 2342     Chief Complaint  Patient presents with  . Shortness of Breath    The history is provided by the patient. No language interpreter was used.    HPI Comments: Nathaniel White is a 52 y.o. male with a h/o stroke, renal insufficiency and HTN who presents to the Emergency Department complaining of gradual onset, gradually worsening, constant SOB that began 2 days ago. He states he had dialysis 3 days ago and reports being tired and SOB after the treatment. He reports having 3 dialysis treatments last week. He receives treatment Tuesdays, Thursdays and Saturdays. He states he is only able to make minimal amounts of urine. He states he received a kidney transplant in 2011. He states his body rejected the kidney 1 year after. Pt states he has had a PE in the past. He denies any abdominal pain, nausea, emesis, fever or chills. He denies any recreational drug usage. He denies using O2 at home.   Past Medical History  Diagnosis Date  . Hypertension   . Renal insufficiency   . Anemia   . Stroke   . Hemiparesis due to old stroke     left  . LV dysfunction     EF 25-30% by echo 07/2011  . MR (mitral regurgitation)     moderate to severe, echo 07/2011  . Shortness of breath   . Diabetes mellitus without complication    Past Surgical History  Procedure Laterality Date  . Kidney transplant      2011 rejected kidney 2012 back on dialysis  . Av fistula placement    . Cardiac catheterization      [place] without cardioversion  08/17/2011    Procedure: TRANSESOPHAGEAL ECHOCARDIOGRAM (TEE);  Surgeon: Larey Dresser, MD;  Location: [facility] ENDOSCOPY;  Service: Cardiovascular;  Laterality: N/A;   Family History   Problem Relation Age of Onset  . Hypertension Mother    History  Substance Use Topics  . Smoking status: Current Every Day Smoker -- 0.50 packs/day    Types: Cigarettes  . Smokeless tobacco: Not on file  . Alcohol Use: No    Review of Systems   Allergies  Codeine  Home Medications   Current Outpatient Rx  Name  Route  Sig  Dispense  Refill  . albuterol (PROVENTIL HFA;VENTOLIN HFA) 108 (90 BASE) MCG/ACT inhaler   Inhalation   Inhale 1-2 puffs into the lungs every 6 (six) hours as needed for wheezing.   1 Inhaler   0   . amLODipine (NORVASC) 10 MG tablet   Oral   Take 10 mg by mouth every evening.         Marland Kitchen azithromycin (ZITHROMAX) 250 MG tablet      Take one tablet daily starting 07/09/12   6 each   0   . chlorpheniramine-HYDROcodone (TUSSIONEX PENNKINETIC ER) 10-8 MG/5ML LQCR   Oral   Take 5 mLs by mouth every 12 (twelve) hours as needed (cough).   115 mL   0   . cloNIDine (CATAPRES - DOSED IN MG/24 HR) 0.3 mg/24hr   Transdermal   Place 1 patch onto the skin once a week. Change on Tuesday         .  labetalol (NORMODYNE) 300 MG tablet   Oral   Take 150 mg by mouth 2 (two) times daily.         . multivitamin (RENA-VIT) TABS tablet   Oral   Take 1 tablet by mouth at bedtime.   30 tablet   0   . predniSONE (DELTASONE) 10 MG tablet      Take 60mg  (6 tablets) starting 07/09/12 and decrease by one tablet daily until gone   21 tablet   0    Triage Vitals: BP 166/106  Pulse 103  Temp(Src) 98.3 F (36.8 C) (Oral)  Resp 18  SpO2 86%  Physical Exam  Nursing notes reviewed.  Electronic medical record reviewed. VITAL SIGNS:   Filed Vitals:   08/11/12 2322 08/11/12 2329 08/11/12 2333 08/12/12 0208  BP: 166/106     Pulse: 103     Temp: 98.3 F (36.8 C)     TempSrc: Oral     Resp: 18     SpO2: 92% 94% 86% 94%   CONSTITUTIONAL: Awake, oriented, appears non-toxic HENT: Atraumatic, normocephalic, oral mucosa pink and moist, airway patent. Nares  patent without drainage. External ears normal. EYES: Conjunctiva clear, EOMI, PERRLA NECK: Trachea midline, distended neck veins, non-tender, supple CARDIOVASCULAR: Normal heart rate, Normal rhythm, systolic murmur, no rubs, possible S4 PULMONARY/CHEST breath sounds are decreased especially in right face, fine crackles in both bases. Non-tender. ABDOMINAL: Non-distended, soft, non-tender - no rebound or guarding.  BS normal. NEUROLOGIC: Non-focal, moving all four extremities, no gross sensory or motor deficits. EXTREMITIES: No clubbing, cyanosis. 2+ edema bilaterally SKIN: Warm, Dry, No erythema, No rash   ED Course   Procedures (including critical care time)  DIAGNOSTIC STUDIES: Oxygen Saturation is 86% on room air, low by my interpretation.    COORDINATION OF CARE:   11:55 PM-Discussed treatment plan which includes possible admittance with pt at bedside and pt agreed to plan.    Labs Reviewed  CBC WITH DIFFERENTIAL - Abnormal; Notable for the following:    RBC 3.89 (*)    Hemoglobin 11.7 (*)    HCT 36.8 (*)    RDW 17.7 (*)    Platelets 137 (*)    Eosinophils Relative 6 (*)    All other components within normal limits  COMPREHENSIVE METABOLIC PANEL - Abnormal; Notable for the following:    Glucose, Bld 107 (*)    BUN 55 (*)    Creatinine, Ser 12.20 (*)    Calcium 10.8 (*)    Albumin 3.3 (*)    GFR calc non Af Amer 4 (*)    GFR calc Af Amer 5 (*)    All other components within normal limits  PHOSPHORUS - Abnormal; Notable for the following:    Phosphorus 7.8 (*)    All other components within normal limits  PRO B NATRIURETIC PEPTIDE - Abnormal; Notable for the following:    Pro B Natriuretic peptide (BNP) >70000.0 (*)    All other components within normal limits  MAGNESIUM  TROPONIN I   Dg Chest 2 View  08/12/2012   *RADIOLOGY REPORT*  Clinical Data: Shortness of breath history renal transplant, renal insufficiency  CHEST - 2 VIEW  Comparison: 07/08/2012  Findings:  Cardiomegaly evident with diffuse interstitial changes versus mild edema.  Chronic scarring and parenchymal distortion in the right lower lobe with pleural thickening.  No pneumothorax. Trachea midline.  IMPRESSION: Cardiomegaly with superimposed mild edema.  Stable chronic appearance of the right hemithorax with scarring and pleural thickening /  fluid.   Original Report Authenticated By: Jerilynn Mages. Shick, M.D.   1. Shortness of breath   2. ESRD on dialysis     MDM  Initially, patient did have a oxygen saturation on room air 86% at rest. Patient was evaluated for his shortness of breath, patient appears to be at baseline status, he is likely volume overloaded as evidenced by distended neck veins, small amount of fluid in the lungs, 2+ peripheral edema.  However patient was walked With an oxygen monitor, in the lowest saturation while ambulating was 93%.  Patient feels comfortable following up and getting dialyzed tomorrow.  Patient is asking for an albuterol inhaler, will provide this for the patient, he is trying to quit smoking at this time his only had one cigarette in the last 3 days, he is encouraged to quit smoking.  Do not think the shortness of breath is new, do not think it is an anginal equivalent, troponin is unremarkable, EKG   is nonacute. Patient is not hyperkalemic, and does not require emergent dialysis, acidotic etc. Patient okay for discharge to get dialyzed tomorrow morning.   I explained the diagnosis and have given explicit precautions to return to the ER including sudden onset of shortness of breath, chest pain or any other new or worsening symptoms. The patient understands and accepts the medical plan as it's been dictated and I have answered their questions. Discharge instructions concerning home care and prescriptions have been given.  The patient is STABLE and is discharged to home in good condition.   I personally performed the services described in this documentation, which was  scribed in my presence. The recorded information has been reviewed and is accurate. Rhunette Croft, M.D.      Rhunette Croft, MD 08/12/12 [phone]

## 2012-08-11 NOTE — ED Notes (Signed)
Pt. Reports he had dialysis on Sat and reports tired and short of breath for the past 2 days.  Pt. Has distended abd. And diminished lung snds on the R side.

## 2012-08-12 ENCOUNTER — Other Ambulatory Visit: Payer: Self-pay

## 2012-08-12 ENCOUNTER — Emergency Department (HOSPITAL_BASED_OUTPATIENT_CLINIC_OR_DEPARTMENT_OTHER): Payer: Medicare Other

## 2012-08-12 DIAGNOSIS — J988 Other specified respiratory disorders: Secondary | ICD-10-CM | POA: Diagnosis not present

## 2012-08-12 LAB — COMPREHENSIVE METABOLIC PANEL
ALT: 12 U/L (ref 0–53)
AST: 20 U/L (ref 0–37)
Albumin: 3.3 g/dL — ABNORMAL LOW (ref 3.5–5.2)
CO2: 29 mEq/L (ref 19–32)
Calcium: 10.8 mg/dL — ABNORMAL HIGH (ref 8.4–10.5)
GFR calc non Af Amer: 4 mL/min — ABNORMAL LOW (ref >90)
Sodium: 142 mEq/L (ref 135–145)
Total Protein: 6.6 g/dL (ref 6.0–8.3)

## 2012-08-12 LAB — PRO B NATRIURETIC PEPTIDE: Pro B Natriuretic peptide (BNP): >70000 pg/mL — ABNORMAL HIGH (ref 0–125)

## 2012-08-12 LAB — CBC WITH DIFFERENTIAL/PLATELET
Basophils Absolute: 0 10*3/uL (ref 0.0–0.1)
Eosinophils Relative: 6 % — ABNORMAL HIGH (ref 0–5)
Lymphocytes Relative: 21 % (ref 12–46)
MCV: 94.6 fL (ref 78.0–100.0)
Neutro Abs: 3.2 10*3/uL (ref 1.7–7.7)
Platelets: 137 10*3/uL — ABNORMAL LOW (ref 150–400)
RDW: 17.7 % — ABNORMAL HIGH (ref 11.5–15.5)
WBC: 5.2 10*3/uL (ref 4.0–10.5)

## 2012-08-12 LAB — TROPONIN I: Troponin I: <0.3 ng/mL (ref <0.30)

## 2012-08-12 MED ORDER — ALBUTEROL SULFATE (5 MG/ML) 0.5% IN NEBU
2.5000 mg | INHALATION_SOLUTION | Freq: Once | RESPIRATORY_TRACT | Status: AC
  Administered 2012-08-12: 2.5 mg via RESPIRATORY_TRACT
  Filled 2012-08-12: qty 1

## 2012-08-12 MED ORDER — ALBUTEROL SULFATE HFA 108 (90 BASE) MCG/ACT IN AERS
2.0000 | INHALATION_SPRAY | RESPIRATORY_TRACT | Status: DC | PRN
  Administered 2012-08-12: 2 via RESPIRATORY_TRACT
  Filled 2012-08-12: qty 6.7

## 2012-08-12 MED ORDER — IPRATROPIUM BROMIDE 0.02 % IN SOLN
0.5000 mg | Freq: Once | RESPIRATORY_TRACT | Status: AC
  Administered 2012-08-12: 0.5 mg via RESPIRATORY_TRACT
  Filled 2012-08-12: qty 2.5

## 2012-08-12 NOTE — ED Notes (Signed)
While ambulating Spo2 ranged 93-96% on RA.

## 2012-08-12 NOTE — ED Notes (Signed)
Patient transported to X-ray 

## 2012-09-07 DIAGNOSIS — N186 End stage renal disease: Secondary | ICD-10-CM | POA: Diagnosis not present

## 2012-09-09 DIAGNOSIS — D509 Iron deficiency anemia, unspecified: Secondary | ICD-10-CM | POA: Diagnosis not present

## 2012-09-09 DIAGNOSIS — N2581 Secondary hyperparathyroidism of renal origin: Secondary | ICD-10-CM | POA: Diagnosis not present

## 2012-09-09 DIAGNOSIS — N186 End stage renal disease: Secondary | ICD-10-CM | POA: Diagnosis not present

## 2012-09-09 DIAGNOSIS — D631 Anemia in chronic kidney disease: Secondary | ICD-10-CM | POA: Diagnosis not present

## 2012-09-22 ENCOUNTER — Encounter: Payer: Self-pay | Admitting: Surgery

## 2012-09-25 ENCOUNTER — Encounter (HOSPITAL_BASED_OUTPATIENT_CLINIC_OR_DEPARTMENT_OTHER): Payer: Self-pay | Admitting: Emergency Medicine

## 2012-09-25 DIAGNOSIS — J984 Other disorders of lung: Secondary | ICD-10-CM | POA: Diagnosis not present

## 2012-09-25 DIAGNOSIS — J40 Bronchitis, not specified as acute or chronic: Secondary | ICD-10-CM | POA: Diagnosis not present

## 2012-09-25 DIAGNOSIS — Z7902 Long term (current) use of antithrombotics/antiplatelets: Secondary | ICD-10-CM | POA: Diagnosis not present

## 2012-09-25 DIAGNOSIS — IMO0002 Reserved for concepts with insufficient information to code with codable children: Secondary | ICD-10-CM | POA: Diagnosis not present

## 2012-09-25 DIAGNOSIS — Z8673 Personal history of transient ischemic attack (TIA), and cerebral infarction without residual deficits: Secondary | ICD-10-CM | POA: Diagnosis not present

## 2012-09-25 DIAGNOSIS — Z8679 Personal history of other diseases of the circulatory system: Secondary | ICD-10-CM | POA: Insufficient documentation

## 2012-09-25 DIAGNOSIS — J209 Acute bronchitis, unspecified: Secondary | ICD-10-CM | POA: Diagnosis not present

## 2012-09-25 DIAGNOSIS — N186 End stage renal disease: Secondary | ICD-10-CM | POA: Diagnosis not present

## 2012-09-25 DIAGNOSIS — Z992 Dependence on renal dialysis: Secondary | ICD-10-CM | POA: Insufficient documentation

## 2012-09-25 DIAGNOSIS — I10 Essential (primary) hypertension: Secondary | ICD-10-CM | POA: Insufficient documentation

## 2012-09-25 DIAGNOSIS — F17200 Nicotine dependence, unspecified, uncomplicated: Secondary | ICD-10-CM | POA: Insufficient documentation

## 2012-09-25 DIAGNOSIS — E119 Type 2 diabetes mellitus without complications: Secondary | ICD-10-CM | POA: Diagnosis not present

## 2012-09-25 DIAGNOSIS — Z862 Personal history of diseases of the blood and blood-forming organs and certain disorders involving the immune mechanism: Secondary | ICD-10-CM | POA: Diagnosis not present

## 2012-09-25 DIAGNOSIS — Z79899 Other long term (current) drug therapy: Secondary | ICD-10-CM | POA: Diagnosis not present

## 2012-09-25 NOTE — ED Notes (Signed)
Pt sts his cough has been getting worse since he was here in August. Has been coughing up clear to white mucous. Frequent cough noted.

## 2012-09-26 ENCOUNTER — Emergency Department (HOSPITAL_BASED_OUTPATIENT_CLINIC_OR_DEPARTMENT_OTHER)
Admit: 2012-09-26 | Discharge: 2012-09-26 | Disposition: A | Discharge status: Home / Self Care | Payer: Medicare Other | Attending: Emergency Medicine | Admitting: Emergency Medicine

## 2012-09-26 ENCOUNTER — Emergency Department (HOSPITAL_BASED_OUTPATIENT_CLINIC_OR_DEPARTMENT_OTHER)
Admission: EM | Admit: 2012-09-26 | Discharge: 2012-09-26 | Disposition: A | Discharge status: Home / Self Care | Payer: Medicare Other | Attending: Emergency Medicine | Admitting: Emergency Medicine

## 2012-09-26 DIAGNOSIS — J984 Other disorders of lung: Secondary | ICD-10-CM | POA: Diagnosis not present

## 2012-09-26 DIAGNOSIS — J40 Bronchitis, not specified as acute or chronic: Secondary | ICD-10-CM

## 2012-09-26 MED ORDER — HYDROCOD POLST-CHLORPHEN POLST 10-8 MG/5ML PO LQCR: 5.0000 mL | Freq: Two times a day (BID) | ORAL | Status: DC | PRN

## 2012-09-26 MED ORDER — AZITHROMYCIN 250 MG PO TABS: ORAL_TABLET | ORAL | Status: DC

## 2012-09-26 MED ORDER — ALBUTEROL SULFATE HFA 108 (90 BASE) MCG/ACT IN AERS
2.0000 | INHALATION_SPRAY | RESPIRATORY_TRACT | Status: DC | PRN
  Administered 2012-09-26: 2 via RESPIRATORY_TRACT
  Filled 2012-09-26: qty 6.7

## 2012-09-26 NOTE — ED Notes (Signed)
D/c home with ride- rx x 2 given for zithromax and tussionex. Inhaler given by RT with spacer

## 2012-09-26 NOTE — ED Provider Notes (Signed)
CSN: [id number]     Arrival date & time 09/25/12  2343 History   None    Chief Complaint  Patient presents with  . Cough   (Consider location/radiation/quality/duration/timing/severity/associated sxs/prior Treatment) HPI This is a 52 year old male with end-stage renal disease on hemodialysis as well lung disease status post pleurocentesis. He is here with cough that has been present for about 2 weeks. It has been exacerbated by the fact that his albuterol inhaler ran out about a week ago. He has had no fever with the cough. The cough is nonproductive. It is causing him to have difficulty sleeping at night. It is worse lying supine. He states he received relief when he was prescribed Zithromax in the past. He states he has been compliant with his dialysis.  Past Medical History  Diagnosis Date  . Hypertension   . Renal insufficiency   . Anemia   . Stroke   . Hemiparesis due to old stroke     left  . LV dysfunction     EF 25-30% by echo 07/2011  . MR (mitral regurgitation)     moderate to severe, echo 07/2011  . Shortness of breath   . Diabetes mellitus without complication    Past Surgical History  Procedure Laterality Date  . Kidney transplant      2011 rejected kidney 2012 back on dialysis  . Av fistula placement    . Cardiac catheterization      [place] without cardioversion  08/17/2011    Procedure: TRANSESOPHAGEAL ECHOCARDIOGRAM (TEE);  Surgeon: Larey Dresser, MD;  Location: [facility] ENDOSCOPY;  Service: Cardiovascular;  Laterality: N/A;   Family History  Problem Relation Age of Onset  . Hypertension Mother    History  Substance Use Topics  . Smoking status: Current Every Day Smoker -- 0.50 packs/day    Types: Cigarettes  . Smokeless tobacco: Not on file  . Alcohol Use: No    Review of Systems  All other systems reviewed and are negative.    Allergies  Codeine  Home Medications   Current Outpatient Rx  Name  Route  Sig  Dispense  Refill  .  albuterol (PROVENTIL HFA;VENTOLIN HFA) 108 (90 BASE) MCG/ACT inhaler   Inhalation   Inhale 1-2 puffs into the lungs every 6 (six) hours as needed for wheezing.   1 Inhaler   0   . amLODipine (NORVASC) 10 MG tablet   Oral   Take 10 mg by mouth every evening.         Marland Kitchen azithromycin (ZITHROMAX) 250 MG tablet      Take one tablet daily starting 07/09/12   6 each   0   . Calcium Carbonate Antacid (TUMS PO)   Oral   Take 200 mg by mouth 3 (three) times daily.         . chlorpheniramine-HYDROcodone (TUSSIONEX PENNKINETIC ER) 10-8 MG/5ML LQCR   Oral   Take 5 mLs by mouth every 12 (twelve) hours as needed (cough).   115 mL   0   . cinacalcet (SENSIPAR) 30 MG tablet   Oral   Take 30 mg by mouth daily.         . clonazePAM (KLONOPIN) 1 MG tablet   Oral   Take 1 mg by mouth at bedtime.         . cloNIDine (CATAPRES - DOSED IN MG/24 HR) 0.3 mg/24hr   Transdermal   Place 1 patch onto the skin once a week. Change  on Tuesday         . clopidogrel (PLAVIX) 75 MG tablet   Oral   Take 75 mg by mouth daily.         . Insulin Lispro, Human, (HUMALOG [place])   Subcutaneous   Inject 5 Units into the skin 3 (three) times daily.         Marland Kitchen labetalol (NORMODYNE) 300 MG tablet   Oral   Take 150 mg by mouth 2 (two) times daily.         . multivitamin (RENA-VIT) TABS tablet   Oral   Take 1 tablet by mouth at bedtime.   30 tablet   0   . predniSONE (DELTASONE) 10 MG tablet      Take 60mg  (6 tablets) starting 07/09/12 and decrease by one tablet daily until gone   21 tablet   0   . tadalafil (CIALIS) 5 MG tablet   Oral   Take 5 mg by mouth daily as needed for erectile dysfunction.         . traMADol (ULTRAM) 50 MG tablet   Oral   Take 50 mg by mouth every 6 (six) hours as needed for pain. Take 1-2 tablets by mouth every 6 hours for pain          BP 147/93  Pulse 94  Temp(Src) 99.1 F (37.3 C) (Oral)  Resp 20  Ht 5\' 9"  (1.753 m)  Wt 170 lb (77.111 kg)  BMI  25.09 kg/m2  SpO2 99%  Physical Exam General: Well-developed, well-nourished male in no acute distress; appearance consistent with age of record HENT: normocephalic; atraumatic Eyes: pupils equal, round and reactive to light; extraocular muscles intact Neck: supple Heart: regular rate and rhythm Lungs: clear to auscultation bilaterally; frequent dry cough Abdomen: soft; nondistended; nontender; no masses or hepatosplenomegaly; bowel sounds present Extremities: No deformity; full range of motion; dialysis fistula left upper arm with pulse and thrill Neurologic: Awake, alert and oriented; motor function intact in all extremities and symmetric; no facial droop Skin: Warm and dry Psychiatric: Normal mood and affect    ED Course  Procedures (including critical care time) Labs Review   MDM  Nursing notes and vitals signs, including pulse oximetry, reviewed.  Summary of this visit's results, reviewed by myself:  Imaging Studies: Dg Chest 2 View  09/26/2012   *RADIOLOGY REPORT*  Clinical Data: Cough  CHEST - 2 VIEW  Comparison: Prior radiograph from 08/12/2012  Findings: Cardiomegaly is stable as compared to the prior examination.  Lungs are normally inflated.  There is no significant pulmonary edema.  Chronic parenchymal opacity is again seen within the right lung base, likely not significantly changed.  Pleural thickening/reaction and / or pleural effusion at the right lower lobe is also similar.  Lung remains clear.  No pneumothorax.  No acute osseous abnormality identified.  IMPRESSION: 1.  Similar appearance of the right lung base with persistent chronic parenchymal scarring and / or pleural thickening/reaction or pleural effusion. 2. Stable cardiomegaly without pulmonary edema.   Original Report Authenticated By: Jeannine Boga, M.D.        Wynetta Fines, MD 09/26/12 [phone]

## 2012-09-26 NOTE — ED Notes (Signed)
MD at bedside. 

## 2012-10-07 DIAGNOSIS — N186 End stage renal disease: Secondary | ICD-10-CM | POA: Diagnosis not present

## 2012-10-09 DIAGNOSIS — D509 Iron deficiency anemia, unspecified: Secondary | ICD-10-CM | POA: Diagnosis not present

## 2012-10-09 DIAGNOSIS — N186 End stage renal disease: Secondary | ICD-10-CM | POA: Diagnosis not present

## 2012-10-09 DIAGNOSIS — D631 Anemia in chronic kidney disease: Secondary | ICD-10-CM | POA: Diagnosis not present

## 2012-10-10 ENCOUNTER — Encounter: Payer: Self-pay | Admitting: Surgery

## 2012-10-13 ENCOUNTER — Ambulatory Visit: Payer: Medicare Other | Admitting: Surgery

## 2012-10-24 ENCOUNTER — Encounter: Payer: Self-pay | Admitting: Family

## 2012-10-27 ENCOUNTER — Ambulatory Visit (INDEPENDENT_AMBULATORY_CARE_PROVIDER_SITE_OTHER): Payer: Medicare Other | Admitting: Surgery

## 2012-10-27 ENCOUNTER — Encounter (INDEPENDENT_AMBULATORY_CARE_PROVIDER_SITE_OTHER): Payer: Self-pay

## 2012-10-27 ENCOUNTER — Encounter: Payer: Self-pay | Admitting: Surgery

## 2012-10-27 ENCOUNTER — Other Ambulatory Visit: Payer: Self-pay

## 2012-10-27 VITALS — BP 166/98 | HR 99 | Ht 69.0 in | Wt 176.0 lb

## 2012-10-27 DIAGNOSIS — T82898A Other specified complication of vascular prosthetic devices, implants and grafts, initial encounter: Secondary | ICD-10-CM | POA: Diagnosis not present

## 2012-10-27 DIAGNOSIS — N186 End stage renal disease: Secondary | ICD-10-CM | POA: Diagnosis not present

## 2012-10-27 NOTE — Progress Notes (Signed)
[facility] [place]   Patient name: Nathaniel White MRN: [id number] DOB: 06-Aug-1960 Sex: male     Chief Complaint  Patient presents with  . Re-evaluation    non healing ulcer on AVF - Dr. Mercy Moore - HD TTS    HISTORY OF PRESENT ILLNESS: The patient comes in today for evaluation of his left upper arm AV fistula.  He has had a skin ulceration for several weeks.  It has not improved.  He has not had any imaging studies.  Past Medical History  Diagnosis Date  . Hypertension   . Renal insufficiency   . Anemia   . Stroke   . Hemiparesis due to old stroke     left  . LV dysfunction     EF 25-30% by echo 07/2011  . MR (mitral regurgitation)     moderate to severe, echo 07/2011  . Shortness of breath   . Diabetes mellitus without complication     Past Surgical History  Procedure Laterality Date  . Kidney transplant      2011 rejected kidney 2012 back on dialysis  . Av fistula placement    . Cardiac catheterization      [place] without cardioversion  08/17/2011    Procedure: TRANSESOPHAGEAL ECHOCARDIOGRAM (TEE);  Surgeon: Larey Dresser, MD;  Location: [facility] ENDOSCOPY;  Service: Cardiovascular;  Laterality: N/A;    History   Social History  . Marital Status: Single    Spouse Name: N/A    Number of Children: N/A  . Years of Education: N/A   Occupational History  . Not on file.   Social History Main Topics  . Smoking status: Former Smoker -- 0.50 packs/day    Types: Cigarettes    Quit date: 10/13/2012  . Smokeless tobacco: Not on file  . Alcohol Use: No  . Drug Use: No  . Sexual Activity: Not on file   Other Topics Concern  . Not on file   Social History Narrative  . No narrative on file    Family History  Problem Relation Age of Onset  . Hypertension Mother     Allergies as of 10/27/2012 - Review Complete 09/25/2012  Allergen Reaction Noted  . Codeine Shortness Of Breath 09/29/2008    Current Outpatient Prescriptions  on File Prior to Visit  Medication Sig Dispense Refill  . albuterol (PROVENTIL HFA;VENTOLIN HFA) 108 (90 BASE) MCG/ACT inhaler Inhale 1-2 puffs into the lungs every 6 (six) hours as needed for wheezing.  1 Inhaler  0  . amLODipine (NORVASC) 10 MG tablet Take 10 mg by mouth every evening.      Marland Kitchen azithromycin (ZITHROMAX) 250 MG tablet Take one tablet daily starting 07/09/12  6 each  0  . Calcium Carbonate Antacid (TUMS PO) Take 200 mg by mouth 3 (three) times daily.      . chlorpheniramine-HYDROcodone (TUSSIONEX PENNKINETIC ER) 10-8 MG/5ML LQCR Take 5 mLs by mouth every 12 (twelve) hours as needed (cough).  115 mL  0  . cinacalcet (SENSIPAR) 30 MG tablet Take 30 mg by mouth daily.      . clonazePAM (KLONOPIN) 1 MG tablet Take 1 mg by mouth at bedtime.      . cloNIDine (CATAPRES - DOSED IN MG/24 HR) 0.3 mg/24hr Place 1 patch onto the skin once a week. Change on Tuesday      . clopidogrel (PLAVIX) 75 MG tablet Take 75 mg by mouth daily.      Marland Kitchen  Insulin Lispro, Human, (HUMALOG [place]) Inject 5 Units into the skin 3 (three) times daily.      Marland Kitchen labetalol (NORMODYNE) 300 MG tablet Take 150 mg by mouth 2 (two) times daily.      . multivitamin (RENA-VIT) TABS tablet Take 1 tablet by mouth at bedtime.  30 tablet  0  . predniSONE (DELTASONE) 10 MG tablet Take 60mg  (6 tablets) starting 07/09/12 and decrease by one tablet daily until gone  21 tablet  0  . tadalafil (CIALIS) 5 MG tablet Take 5 mg by mouth daily as needed for erectile dysfunction.      . traMADol (ULTRAM) 50 MG tablet Take 50 mg by mouth every 6 (six) hours as needed for pain. Take 1-2 tablets by mouth every 6 hours for pain       No current facility-administered medications on file prior to visit.     REVIEW OF SYSTEMS: Left arm weakness from all stroke No chest pain No shortness of breath  PHYSICAL EXAMINATION:   Vital signs are BP 166/98  Pulse 99  Ht 5\' 9"  (1.753 m)  Wt 176 lb (79.833 kg)  BMI 25.98 kg/m2  SpO2 96% General: The patient  appears their stated age. HEENT:  No gross abnormalities Pulmonary:  Non labored breathing Abdomen: Soft and non-tender Musculoskeletal: There are no major deformities. Neurologic: No focal weakness or paresthesias are detected, Skin: There are no ulcer or rashes noted. Psychiatric: The patient has normal affect. Cardiovascular: There is a regular rate and rhythm without significant murmur appreciated.  Good thrill within left upper arm fistula which is ectatic and areas and aneurysmal in areas.  There is an area of skin breakdown with ulceration which is very superficial.  Assessment: Left upper arm fistula ulceration Plan: The first step in management is to proceed with a fistulogram to evaluate for stenoses within the fistula.  Once this has been done and if any stenosis are identified, they should be corrected.  I within the to make a decision as to whether or not to proceed with aneurysmorrhaphy, incorporating the area of ulceration and removing it.  His fistulogram will be scheduled for Wednesday, October 29.  I told him to make sure that no further dialysis cannulations were done in the ulcerated area  V. Leia Alf, M.D. [facility] [place] Office: [phone] Pager:  [phone]

## 2012-10-30 DIAGNOSIS — E1129 Type 2 diabetes mellitus with other diabetic kidney complication: Secondary | ICD-10-CM | POA: Diagnosis not present

## 2012-11-03 ENCOUNTER — Encounter (HOSPITAL_COMMUNITY): Payer: Self-pay | Admitting: Pharmacy Technician

## 2012-11-05 ENCOUNTER — Ambulatory Visit (HOSPITAL_COMMUNITY)
Admission: RE | Admit: 2012-11-05 | Discharge: 2012-11-05 | Disposition: A | Discharge status: Home / Self Care | Payer: Medicare Other | Source: Ambulatory Visit | Attending: Surgery | Admitting: Surgery

## 2012-11-05 ENCOUNTER — Encounter (HOSPITAL_COMMUNITY)
Admission: RE | Disposition: A | Discharge status: Home / Self Care | Payer: Self-pay | Source: Ambulatory Visit | Attending: Surgery

## 2012-11-05 DIAGNOSIS — I120 Hypertensive chronic kidney disease with stage 5 chronic kidney disease or end stage renal disease: Secondary | ICD-10-CM | POA: Diagnosis not present

## 2012-11-05 DIAGNOSIS — N186 End stage renal disease: Secondary | ICD-10-CM | POA: Diagnosis not present

## 2012-11-05 DIAGNOSIS — Z01812 Encounter for preprocedural laboratory examination: Secondary | ICD-10-CM | POA: Diagnosis not present

## 2012-11-05 DIAGNOSIS — I69959 Hemiplegia and hemiparesis following unspecified cerebrovascular disease affecting unspecified side: Secondary | ICD-10-CM | POA: Insufficient documentation

## 2012-11-05 DIAGNOSIS — IMO0002 Reserved for concepts with insufficient information to code with codable children: Secondary | ICD-10-CM | POA: Diagnosis not present

## 2012-11-05 DIAGNOSIS — I059 Rheumatic mitral valve disease, unspecified: Secondary | ICD-10-CM | POA: Insufficient documentation

## 2012-11-05 DIAGNOSIS — I871 Compression of vein: Secondary | ICD-10-CM | POA: Diagnosis not present

## 2012-11-05 DIAGNOSIS — Z87891 Personal history of nicotine dependence: Secondary | ICD-10-CM | POA: Insufficient documentation

## 2012-11-05 DIAGNOSIS — Z7902 Long term (current) use of antithrombotics/antiplatelets: Secondary | ICD-10-CM | POA: Insufficient documentation

## 2012-11-05 DIAGNOSIS — Z794 Long term (current) use of insulin: Secondary | ICD-10-CM | POA: Diagnosis not present

## 2012-11-05 DIAGNOSIS — E119 Type 2 diabetes mellitus without complications: Secondary | ICD-10-CM | POA: Diagnosis not present

## 2012-11-05 DIAGNOSIS — T82898A Other specified complication of vascular prosthetic devices, implants and grafts, initial encounter: Secondary | ICD-10-CM | POA: Insufficient documentation

## 2012-11-05 DIAGNOSIS — Y832 Surgical operation with anastomosis, bypass or graft as the cause of abnormal reaction of the patient, or of later complication, without mention of misadventure at the time of the procedure: Secondary | ICD-10-CM | POA: Insufficient documentation

## 2012-11-05 DIAGNOSIS — Z79899 Other long term (current) drug therapy: Secondary | ICD-10-CM | POA: Diagnosis not present

## 2012-11-05 HISTORY — PX: SHUNTOGRAM: SHX5491

## 2012-11-05 LAB — POCT I-STAT, CHEM 8
BUN: 40 mg/dL — ABNORMAL HIGH (ref 6–23)
Chloride: 98 mEq/L (ref 96–112)
Creatinine, Ser: 7.8 mg/dL — ABNORMAL HIGH (ref 0.50–1.35)
Glucose, Bld: 114 mg/dL — ABNORMAL HIGH (ref 70–99)
Hemoglobin: 12.9 g/dL — ABNORMAL LOW (ref 13.0–17.0)
Potassium: 3.8 mEq/L (ref 3.5–5.1)
Sodium: 140 mEq/L (ref 135–145)
TCO2: 30 mmol/L (ref 0–100)

## 2012-11-05 SURGERY — ASSESSMENT, SHUNT FUNCTION, WITH CONTRAST RADIOGRAPHIC STUDY
Anesthesia: LOCAL

## 2012-11-05 MED ORDER — ALUM & MAG HYDROXIDE-SIMETH 200-200-20 MG/5ML PO SUSP: 15.0000 mL | ORAL | Status: DC | PRN

## 2012-11-05 MED ORDER — ACETAMINOPHEN 325 MG RE SUPP: 325.0000 mg | RECTAL | Status: DC | PRN

## 2012-11-05 MED ORDER — HYDRALAZINE HCL 20 MG/ML IJ SOLN: 10.0000 mg | INTRAMUSCULAR | Status: DC | PRN

## 2012-11-05 MED ORDER — LIDOCAINE HCL (PF) 1 % IJ SOLN
INTRAMUSCULAR | Status: AC
  Filled 2012-11-05: qty 30

## 2012-11-05 MED ORDER — METOPROLOL TARTRATE 1 MG/ML IV SOLN: 2.0000 mg | INTRAVENOUS | Status: DC | PRN

## 2012-11-05 MED ORDER — ACETAMINOPHEN 325 MG PO TABS: 325.0000 mg | ORAL_TABLET | ORAL | Status: DC | PRN

## 2012-11-05 MED ORDER — GUAIFENESIN-DM 100-10 MG/5ML PO SYRP: 15.0000 mL | ORAL_SOLUTION | ORAL | Status: DC | PRN

## 2012-11-05 MED ORDER — ONDANSETRON HCL 4 MG/2ML IJ SOLN: 4.0000 mg | Freq: Four times a day (QID) | INTRAMUSCULAR | Status: DC | PRN

## 2012-11-05 MED ORDER — HEPARIN (PORCINE) IN NACL 2-0.9 UNIT/ML-% IJ SOLN
INTRAMUSCULAR | Status: AC
  Filled 2012-11-05: qty 500

## 2012-11-05 MED ORDER — PHENOL 1.4 % MT LIQD: 1.0000 | OROMUCOSAL | Status: DC | PRN

## 2012-11-05 MED ORDER — LABETALOL HCL 5 MG/ML IV SOLN: 10.0000 mg | INTRAVENOUS | Status: DC | PRN

## 2012-11-05 MED ORDER — SODIUM CHLORIDE 0.9 % IJ SOLN: 3.0000 mL | INTRAMUSCULAR | Status: DC | PRN

## 2012-11-05 NOTE — H&P (View-Only) (Signed)
[facility] [place]   Patient name: Nathaniel White MRN: [id number] DOB: [birth year] Sex: male     Chief Complaint  Patient presents with  . Re-evaluation    non healing ulcer on AVF - Dr. Mercy Moore - HD TTS    HISTORY OF PRESENT ILLNESS: The patient comes in today for evaluation of his left upper arm AV fistula.  He has had a skin ulceration for several weeks.  It has not improved.  He has not had any imaging studies.  Past Medical History  Diagnosis Date  . Hypertension   . Renal insufficiency   . Anemia   . Stroke   . Hemiparesis due to old stroke     left  . LV dysfunction     EF 25-30% by echo 07/2011  . MR (mitral regurgitation)     moderate to severe, echo 07/2011  . Shortness of breath   . Diabetes mellitus without complication     Past Surgical History  Procedure Laterality Date  . Kidney transplant      2011 rejected kidney 2012 back on dialysis  . Av fistula placement    . Cardiac catheterization      [place] without cardioversion  08/17/2011    Procedure: TRANSESOPHAGEAL ECHOCARDIOGRAM (TEE);  Surgeon: Larey Dresser, MD;  Location: [facility] ENDOSCOPY;  Service: Cardiovascular;  Laterality: N/A;    History   Social History  . Marital Status: Single    Spouse Name: N/A    Number of Children: N/A  . Years of Education: N/A   Occupational History  . Not on file.   Social History Main Topics  . Smoking status: Former Smoker -- 0.50 packs/day    Types: Cigarettes    Quit date: 10/13/2012  . Smokeless tobacco: Not on file  . Alcohol Use: No  . Drug Use: No  . Sexual Activity: Not on file   Other Topics Concern  . Not on file   Social History Narrative  . No narrative on file    Family History  Problem Relation Age of Onset  . Hypertension Mother     Allergies as of 10/27/2012 - Review Complete 09/25/2012  Allergen Reaction Noted  . Codeine Shortness Of Breath 09/29/2008    Current Outpatient Prescriptions  on File Prior to Visit  Medication Sig Dispense Refill  . albuterol (PROVENTIL HFA;VENTOLIN HFA) 108 (90 BASE) MCG/ACT inhaler Inhale 1-2 puffs into the lungs every 6 (six) hours as needed for wheezing.  1 Inhaler  0  . amLODipine (NORVASC) 10 MG tablet Take 10 mg by mouth every evening.      Marland Kitchen azithromycin (ZITHROMAX) 250 MG tablet Take one tablet daily starting 07/09/12  6 each  0  . Calcium Carbonate Antacid (TUMS PO) Take 200 mg by mouth 3 (three) times daily.      . chlorpheniramine-HYDROcodone (TUSSIONEX PENNKINETIC ER) 10-8 MG/5ML LQCR Take 5 mLs by mouth every 12 (twelve) hours as needed (cough).  115 mL  0  . cinacalcet (SENSIPAR) 30 MG tablet Take 30 mg by mouth daily.      . clonazePAM (KLONOPIN) 1 MG tablet Take 1 mg by mouth at bedtime.      . cloNIDine (CATAPRES - DOSED IN MG/24 HR) 0.3 mg/24hr Place 1 patch onto the skin once a week. Change on Tuesday      . clopidogrel (PLAVIX) 75 MG tablet Take 75 mg by mouth daily.      Marland Kitchen  Insulin Lispro, Human, (HUMALOG [place]) Inject 5 Units into the skin 3 (three) times daily.      Marland Kitchen labetalol (NORMODYNE) 300 MG tablet Take 150 mg by mouth 2 (two) times daily.      . multivitamin (RENA-VIT) TABS tablet Take 1 tablet by mouth at bedtime.  30 tablet  0  . predniSONE (DELTASONE) 10 MG tablet Take 60mg  (6 tablets) starting 07/09/12 and decrease by one tablet daily until gone  21 tablet  0  . tadalafil (CIALIS) 5 MG tablet Take 5 mg by mouth daily as needed for erectile dysfunction.      . traMADol (ULTRAM) 50 MG tablet Take 50 mg by mouth every 6 (six) hours as needed for pain. Take 1-2 tablets by mouth every 6 hours for pain       No current facility-administered medications on file prior to visit.     REVIEW OF SYSTEMS: Left arm weakness from all stroke No chest pain No shortness of breath  PHYSICAL EXAMINATION:   Vital signs are BP 166/98  Pulse 99  Ht 5\' 9"  (1.753 m)  Wt 176 lb (79.833 kg)  BMI 25.98 kg/m2  SpO2 96% General: The patient  appears their stated age. HEENT:  No gross abnormalities Pulmonary:  Non labored breathing Abdomen: Soft and non-tender Musculoskeletal: There are no major deformities. Neurologic: No focal weakness or paresthesias are detected, Skin: There are no ulcer or rashes noted. Psychiatric: The patient has normal affect. Cardiovascular: There is a regular rate and rhythm without significant murmur appreciated.  Good thrill within left upper arm fistula which is ectatic and areas and aneurysmal in areas.  There is an area of skin breakdown with ulceration which is very superficial.  Assessment: Left upper arm fistula ulceration Plan: The first step in management is to proceed with a fistulogram to evaluate for stenoses within the fistula.  Once this has been done and if any stenosis are identified, they should be corrected.  I within the to make a decision as to whether or not to proceed with aneurysmorrhaphy, incorporating the area of ulceration and removing it.  His fistulogram will be scheduled for Wednesday, October 29.  I told him to make sure that no further dialysis cannulations were done in the ulcerated area  V. Leia Alf, M.D. [facility] [place] Office: [phone] Pager:  [phone]

## 2012-11-05 NOTE — Progress Notes (Signed)
Discharge instruction given per MD order.  Pt was able to verbalize understanding.  Pt denies any pain at this time.  Pt to car via wheelchair.

## 2012-11-05 NOTE — Op Note (Signed)
[facility] [place]  Patient name: Nathaniel White MRN: [id number] DOB: 1960/07/10 Sex: male  11/05/2012 Pre-operative Diagnosis: End-stage renal disease with ulcer on left cephalic fistula Post-operative diagnosis:  Same Surgeon:  [name] Procedure Performed:  1.  ultrasound-guided access, left cephalic vein  2.  fistulogram  3.  angioplasty, left cephalic vein  4.  followup x1   Indications:  Patient is a left cephalic vein fistula.  He developed an ulcer over the vein.  He comes in for further evaluation.  Procedure:  The patient was identified in the holding area and taken to room 8.  The patient was then placed supine on the table and prepped and draped in the usual sterile fashion.  A time out was called.  Ultrasound was used to evaluate the fistula.  The vein was patent and compressible.  A digital ultrasound image was acquired.  The fistula was then accessed under ultrasound guidance using a micropuncture needle.  An 018 wire was then asvanced without resistance and a micropuncture sheath was placed.  Contrast injections were then performed through the sheath.  Findings:  No central venous stenosis is identified.  The arterial venous anastomosis is widely patent.  Aneurysmal changes are seen within the midportion of the fistula.  Just beyond what of the aneurysms, there is an area of stenosis of approximately 60-70%.   Intervention:  After the above images were acquired, I decided to proceed with intervention.  Over a Careers adviser, a 7 French sheath was inserted.  No heparin was administered.  I performed a primary balloon angioplasty of the diseased segment initially using a 7 x 40 Mustang balloon.  Followup angiogram revealed improved a suboptimal results.  Therefore, I elected to up size to a 9 x 40 balloon.  This balloon was taken to nominal pressure and held up for 1 minute.  Completion study revealed slightly improved blood flow across this area.   I did not want to up size the balloon further for fear of vein rupture.  There was improved flow through this area.  Impression:  #1  successful venoplasty of cephalic vein stenosis using a 9 mm balloon  #2  residual narrowing which was felt to not be hemodynamically significant, and not treated for fear of vein rupture.  #3  the patient remains a candidate for percutaneous   V. Annamarie Major, M.D. [facility] [place] Office: [phone] Pager:  [phone]

## 2012-11-05 NOTE — Progress Notes (Signed)
Pt from cath procedure alert and able to tolerate food and fluids.  Pt denies any discomfort at this time.

## 2012-11-05 NOTE — Interval H&P Note (Signed)
History and Physical Interval Note:  11/05/2012 8:35 AM  Nathaniel White  has presented today for surgery, with the diagnosis of End stage renal  The various methods of treatment have been discussed with the patient and family. After consideration of risks, benefits and other options for treatment, the patient has consented to  Procedure(s): Fistulogram (N/A) as a surgical intervention .  The patient's history has been reviewed, patient examined, no change in status, stable for surgery.  I have reviewed the patient's chart and labs.  Questions were answered to the patient's satisfaction.     BRABHAM IV, V. WELLS

## 2012-11-07 DIAGNOSIS — N186 End stage renal disease: Secondary | ICD-10-CM | POA: Diagnosis not present

## 2012-11-08 DIAGNOSIS — N2581 Secondary hyperparathyroidism of renal origin: Secondary | ICD-10-CM | POA: Diagnosis not present

## 2012-11-08 DIAGNOSIS — D631 Anemia in chronic kidney disease: Secondary | ICD-10-CM | POA: Diagnosis not present

## 2012-11-08 DIAGNOSIS — N186 End stage renal disease: Secondary | ICD-10-CM | POA: Diagnosis not present

## 2012-11-08 DIAGNOSIS — E1129 Type 2 diabetes mellitus with other diabetic kidney complication: Secondary | ICD-10-CM | POA: Diagnosis not present

## 2012-11-08 DIAGNOSIS — E8779 Other fluid overload: Secondary | ICD-10-CM | POA: Diagnosis not present

## 2012-11-28 ENCOUNTER — Encounter: Payer: Self-pay | Admitting: Surgery

## 2012-12-01 ENCOUNTER — Ambulatory Visit (INDEPENDENT_AMBULATORY_CARE_PROVIDER_SITE_OTHER): Payer: Medicare Other | Admitting: Surgery

## 2012-12-01 ENCOUNTER — Encounter: Payer: Self-pay | Admitting: Surgery

## 2012-12-01 VITALS — BP 158/98 | HR 98 | Ht 69.0 in | Wt 183.0 lb

## 2012-12-01 DIAGNOSIS — N186 End stage renal disease: Secondary | ICD-10-CM

## 2012-12-01 NOTE — Progress Notes (Signed)
Patient name: Nathaniel White MRN: [id number] DOB: 09/12/1960 Sex: male     Chief Complaint  Patient presents with  . Re-evaluation    3 wk f/u, s/p fistulogram    HISTORY OF PRESENT ILLNESS: The patient is back today for further followup of a ulcer on his left upper arm fistula.  He has an aneurysmal segment of his fistula that has developed a nonhealing ulcer.  On 11/05/2012 he was taken for fistulogram.  A cephalic vein stenosis was dilated using a 9 mm balloon.  He comes back in stating that there has not been any significant change in this area.  He continues to have his fistula cannulated near this site.  Past Medical History  Diagnosis Date  . Hypertension   . Renal insufficiency   . Anemia   . Stroke   . Hemiparesis due to old stroke     left  . LV dysfunction     EF 25-30% by echo 07/2011  . MR (mitral regurgitation)     moderate to severe, echo 07/2011  . Shortness of breath   . Diabetes mellitus without complication     Past Surgical History  Procedure Laterality Date  . Kidney transplant      2011 rejected kidney 2012 back on dialysis  . Av fistula placement    . Cardiac catheterization      [place] without cardioversion  08/17/2011    Procedure: TRANSESOPHAGEAL ECHOCARDIOGRAM (TEE);  Surgeon: Larey Dresser, MD;  Location: [facility] ENDOSCOPY;  Service: Cardiovascular;  Laterality: N/A;    History   Social History  . Marital Status: Single    Spouse Name: N/A    Number of Children: N/A  . Years of Education: N/A   Occupational History  . Not on file.   Social History Main Topics  . Smoking status: Former Smoker -- 0.50 packs/day    Types: Cigarettes    Quit date: 10/13/2012  . Smokeless tobacco: Not on file  . Alcohol Use: No  . Drug Use: No  . Sexual Activity: Not on file   Other Topics Concern  . Not on file   Social History Narrative  . No narrative on file    Family History  Problem Relation Age of Onset  . Hypertension  Mother     Allergies as of 12/01/2012 - Review Complete 12/01/2012  Allergen Reaction Noted  . Codeine Shortness Of Breath 09/29/2008    Current Outpatient Prescriptions on File Prior to Visit  Medication Sig Dispense Refill  . albuterol (PROVENTIL HFA;VENTOLIN HFA) 108 (90 BASE) MCG/ACT inhaler Inhale 1-2 puffs into the lungs every 6 (six) hours as needed for wheezing.  1 Inhaler  0  . clonazePAM (KLONOPIN) 1 MG tablet Take 1 mg by mouth at bedtime.      Marland Kitchen labetalol (NORMODYNE) 300 MG tablet Take 150 mg by mouth 2 (two) times daily.      . tadalafil (CIALIS) 5 MG tablet Take 5 mg by mouth daily as needed for erectile dysfunction.      . traMADol (ULTRAM) 50 MG tablet Take 50 mg by mouth every 6 (six) hours as needed for pain. Take 1-2 tablets by mouth every 6 hours for pain      . amLODipine (NORVASC) 10 MG tablet Take 10 mg by mouth every evening.       No current facility-administered medications on file prior to visit.     REVIEW OF SYSTEMS: No  changes from initial visit  PHYSICAL EXAMINATION:   Vital signs are BP 158/98  Pulse 98  Ht 5\' 9"  (1.753 m)  Wt 183 lb (83.008 kg)  BMI 27.01 kg/m2  SpO2 95% General: The patient appears their stated age. HEENT:  No gross abnormalities Pulmonary:  Non labored breathing Musculoskeletal: There are no major deformities. Neurologic: Left arm weakness and contractures secondary to stroke Skin: There are no ulcer or rashes noted. Psychiatric: The patient has normal affect. Cardiovascular: Left brachiocephalic fistula remains patent.  2 aneurysmal segments are seen in the mid and upper portion.  The mid aneurysmal segment has a nearly healed ulcer with scab over top.  No evidence of infection or bleeding.   Diagnostic Studies None  Assessment: Left brachiocephalic fistula with aneurysmal changes and ulceration Plan: I suggested aneurysmorrhaphy with resection of the ulcerated area.  The patient would not like to have this done prior  to Christmas as he has travel plans.  I stressed the importance of making sure the area is not cannulated for dialysis.  This has been relatively stable for some time therefore I think he can wait until it seemed to schedule.  Hopefully without cannulating his fistula around this area this site will heal.  I will have him come back in one month to evaluate it.  The ulcer remains I will schedule aneurysmorrhaphy and ulcer resection at that time.  [name], M.D. [facility] [place] Office: [phone] Pager:  [phone]

## 2012-12-07 DIAGNOSIS — N186 End stage renal disease: Secondary | ICD-10-CM | POA: Diagnosis not present

## 2012-12-09 DIAGNOSIS — N186 End stage renal disease: Secondary | ICD-10-CM | POA: Diagnosis not present

## 2012-12-09 DIAGNOSIS — D631 Anemia in chronic kidney disease: Secondary | ICD-10-CM | POA: Diagnosis not present

## 2012-12-09 DIAGNOSIS — N2581 Secondary hyperparathyroidism of renal origin: Secondary | ICD-10-CM | POA: Diagnosis not present

## 2012-12-10 ENCOUNTER — Encounter (HOSPITAL_BASED_OUTPATIENT_CLINIC_OR_DEPARTMENT_OTHER): Payer: Self-pay | Admitting: Emergency Medicine

## 2012-12-10 ENCOUNTER — Emergency Department (HOSPITAL_BASED_OUTPATIENT_CLINIC_OR_DEPARTMENT_OTHER): Payer: Medicare Other

## 2012-12-10 ENCOUNTER — Emergency Department (HOSPITAL_BASED_OUTPATIENT_CLINIC_OR_DEPARTMENT_OTHER)
Admission: EM | Admit: 2012-12-10 | Discharge: 2012-12-11 | Disposition: A | Discharge status: Home / Self Care | Payer: Medicare Other | Attending: Emergency Medicine | Admitting: Emergency Medicine

## 2012-12-10 DIAGNOSIS — J189 Pneumonia, unspecified organism: Secondary | ICD-10-CM

## 2012-12-10 DIAGNOSIS — J159 Unspecified bacterial pneumonia: Secondary | ICD-10-CM | POA: Insufficient documentation

## 2012-12-10 DIAGNOSIS — Z862 Personal history of diseases of the blood and blood-forming organs and certain disorders involving the immune mechanism: Secondary | ICD-10-CM | POA: Insufficient documentation

## 2012-12-10 DIAGNOSIS — Z79899 Other long term (current) drug therapy: Secondary | ICD-10-CM | POA: Insufficient documentation

## 2012-12-10 DIAGNOSIS — Z8673 Personal history of transient ischemic attack (TIA), and cerebral infarction without residual deficits: Secondary | ICD-10-CM | POA: Insufficient documentation

## 2012-12-10 DIAGNOSIS — E119 Type 2 diabetes mellitus without complications: Secondary | ICD-10-CM | POA: Insufficient documentation

## 2012-12-10 DIAGNOSIS — I10 Essential (primary) hypertension: Secondary | ICD-10-CM | POA: Insufficient documentation

## 2012-12-10 DIAGNOSIS — R0989 Other specified symptoms and signs involving the circulatory and respiratory systems: Secondary | ICD-10-CM | POA: Diagnosis not present

## 2012-12-10 DIAGNOSIS — Z87891 Personal history of nicotine dependence: Secondary | ICD-10-CM | POA: Diagnosis not present

## 2012-12-10 NOTE — ED Notes (Addendum)
Pt c/o cough x 1 week denies fever

## 2012-12-10 NOTE — ED Notes (Signed)
Patient transported to X-ray 

## 2012-12-10 NOTE — ED Provider Notes (Signed)
CSN: [id number]     Arrival date & time 12/10/12  2214 History  This chart was scribed for Charles B. Karle Starch, MD by Rolanda Lundborg, ED Scribe. This patient was seen in room MH02/MH02 and the patient's care was started at 10:56 PM.    Chief Complaint  Patient presents with  . Cough   The history is provided by the patient. No language interpreter was used.   HPI Comments: Nathaniel White is a 52 y.o. male with a h/o DM, renal insufficiency, HTN who presents to the Emergency Department complaining of worsening cough onset one week ago. Pt denies fever. He had dialysis yesterday in his left upper arm and denies fever during dialysis. He stopped smoking 2 months ago.  Past Medical History  Diagnosis Date  . Hypertension   . Renal insufficiency   . Anemia   . Stroke   . Hemiparesis due to old stroke     left  . LV dysfunction     EF 25-30% by echo 07/2011  . MR (mitral regurgitation)     moderate to severe, echo 07/2011  . Shortness of breath   . Diabetes mellitus without complication    Past Surgical History  Procedure Laterality Date  . Kidney transplant      2011 rejected kidney 2012 back on dialysis  . Av fistula placement    . Cardiac catheterization      [place] without cardioversion  08/17/2011    Procedure: TRANSESOPHAGEAL ECHOCARDIOGRAM (TEE);  Surgeon: Larey Dresser, MD;  Location: [facility] ENDOSCOPY;  Service: Cardiovascular;  Laterality: N/A;   Family History  Problem Relation Age of Onset  . Hypertension Mother    History  Substance Use Topics  . Smoking status: Former Smoker -- 0.50 packs/day    Types: Cigarettes    Quit date: 10/13/2012  . Smokeless tobacco: Not on file  . Alcohol Use: No    Review of Systems  Constitutional: Negative for fever.  Respiratory: Positive for cough.   A complete 10 system review of systems was obtained and all systems are negative except as noted in the HPI and PMH.   Allergies  Codeine  Home Medications    Current Outpatient Rx  Name  Route  Sig  Dispense  Refill  . albuterol (PROVENTIL HFA;VENTOLIN HFA) 108 (90 BASE) MCG/ACT inhaler   Inhalation   Inhale 1-2 puffs into the lungs every 6 (six) hours as needed for wheezing.   1 Inhaler   0   . amLODipine (NORVASC) 10 MG tablet   Oral   Take 10 mg by mouth every evening.         . clonazePAM (KLONOPIN) 1 MG tablet   Oral   Take 1 mg by mouth at bedtime.         Marland Kitchen labetalol (NORMODYNE) 300 MG tablet   Oral   Take 150 mg by mouth 2 (two) times daily.         . tadalafil (CIALIS) 5 MG tablet   Oral   Take 5 mg by mouth daily as needed for erectile dysfunction.         . traMADol (ULTRAM) 50 MG tablet   Oral   Take 50 mg by mouth every 6 (six) hours as needed for pain. Take 1-2 tablets by mouth every 6 hours for pain          BP 159/98  Pulse 101  Temp(Src) 99.2 F (37.3 C) (Oral)  Resp  18  Wt 182 lb (82.555 kg)  SpO2 95% Physical Exam  Nursing note and vitals reviewed. Constitutional: He is oriented to person, place, and time. He appears well-developed and well-nourished.  HENT:  Head: Normocephalic and atraumatic.  Eyes: EOM are normal. Pupils are equal, round, and reactive to light.  Neck: Normal range of motion. Neck supple.  Cardiovascular: Normal rate, normal heart sounds and intact distal pulses.   Pulmonary/Chest: Effort normal and breath sounds normal.  Abdominal: Bowel sounds are normal. He exhibits no distension. There is no tenderness.  Musculoskeletal: Normal range of motion. He exhibits no edema and no tenderness.  Palpable thrill in LUE dialysis access  Neurological: He is alert and oriented to person, place, and time. He has normal strength. No cranial nerve deficit or sensory deficit.  Skin: Skin is warm and dry. No rash noted.  Psychiatric: He has a normal mood and affect.    ED Course  Procedures (including critical care time) Medications - No data to display  DIAGNOSTIC  STUDIES: Oxygen Saturation is 95% on RA, adequate by my interpretation.    COORDINATION OF CARE: 11:39 PM- Discussed treatment plan with pt. Pt agrees to plan.    Labs Review Labs Reviewed - No data to display Imaging Review Dg Chest 2 View  12/11/2012   CLINICAL DATA:  Cough, shortness of breath  EXAM: CHEST  2 VIEW  COMPARISON:  09/26/2012  FINDINGS: Prominent cardiomediastinal contours. Central vascular congestion. Right pleural effusion and or pleural thickening with associated airspace opacity. Interstitial prominence. No pneumothorax. No acute osseous finding.  IMPRESSION: Cardiomegaly with central vascular congestion. Interstitial prominence may reflect interstitial edema or atypical/ viral infection.  Small right pleural effusion/pleural thickening with associated airspace opacity; atelectasis/scarring (favored) versus pneumonia.   Electronically Signed   By: Carlos Levering M.D.   On: 12/11/2012 00:19    EKG Interpretation   None       MDM   1. HCAP (healthcare-associated pneumonia)     CXR images reviewed. Pt has RLL scar from prior VATS but per Radiologist above opacity in that area. Given severity and duration of cough will treat for pneumonia. Qualifies as HCAP given dialysis but otherwise looks well and is afebrile. ESRD dosing for Levaquin. PCP/Nephro followup.    I personally performed the services described in this documentation, which was scribed in my presence. The recorded information has been reviewed and is accurate.      Charles B. Karle Starch, MD 12/11/12 [phone]

## 2012-12-11 ENCOUNTER — Telehealth: Payer: Self-pay

## 2012-12-11 MED ORDER — HYDROCOD POLST-CHLORPHEN POLST 10-8 MG/5ML PO LQCR
5.0000 mL | Freq: Once | ORAL | Status: AC
  Administered 2012-12-11: 5 mL via ORAL
  Filled 2012-12-11: qty 5

## 2012-12-11 MED ORDER — HYDROCOD POLST-CHLORPHEN POLST 10-8 MG/5ML PO LQCR: 5.0000 mL | Freq: Two times a day (BID) | ORAL | Status: DC | PRN

## 2012-12-11 MED ORDER — LEVOFLOXACIN 500 MG PO TABS: 500.0000 mg | ORAL_TABLET | ORAL | Status: DC

## 2012-12-11 MED ORDER — LEVOFLOXACIN 750 MG PO TABS
750.0000 mg | ORAL_TABLET | Freq: Once | ORAL | Status: AC
  Administered 2012-12-11: 750 mg via ORAL
  Filled 2012-12-11: qty 1

## 2012-12-11 NOTE — Telephone Encounter (Signed)
Message copied by Satira Anis on Thu Dec 11, 2012 11:44 AM ------      Message from: Calvert Cantor      Created: Thu Dec 11, 2012 12:34 AM      Regarding: Order for SHIGEKI, FURNO             Patient Name: Nathaniel White, Nathaniel White [id number])      Sex: Male      DOB: Jan 03, 1961              PCP: MATTINGLY, [place]: [facility]             Types of orders made on 12/11/2012: Consult, Medications, Nursing            Order Date:12/11/2012      [place], [place]      Attending Provider:Charles B. Karle Starch, [place]      Authorizing Provider: Mercie Eon. Karle Starch, [place]      Department:[facility]-EMERGENCY DEPT [id number]            Order Specific Information      Order: COPD clinic follow up [Custom: [id number]  Order #: [id number]  Qty: 1        Priority: Routine  Class: Hospital Performed          Where does the patient receive follow up COPD care -> [place]                 Follow up in -> 5-7 days               Released on: 12/11/2012 12:34 AM                    Priority: Routine  Class: Hospital Performed          Where does the patient receive follow up COPD care -> [place]                 Follow up in -> 5-7 days               Released on: 12/11/2012 12:34 AM       ------

## 2012-12-11 NOTE — Telephone Encounter (Signed)
[id number] x1 for pt 

## 2012-12-11 NOTE — ED Notes (Signed)
Pt reports that he has gotten both Flu shot and Pneumonia shot at dialysis.

## 2012-12-12 NOTE — Telephone Encounter (Signed)
I spoke with the pt and advised per ED doctor they wanted him to f/u here. Pt refuses appointment at this time. Lambert Bing, CMA

## 2012-12-15 DIAGNOSIS — D689 Coagulation defect, unspecified: Secondary | ICD-10-CM | POA: Insufficient documentation

## 2012-12-31 DIAGNOSIS — N2581 Secondary hyperparathyroidism of renal origin: Secondary | ICD-10-CM | POA: Diagnosis not present

## 2012-12-31 DIAGNOSIS — N186 End stage renal disease: Secondary | ICD-10-CM | POA: Diagnosis not present

## 2013-01-02 DIAGNOSIS — N2581 Secondary hyperparathyroidism of renal origin: Secondary | ICD-10-CM | POA: Diagnosis not present

## 2013-01-02 DIAGNOSIS — N186 End stage renal disease: Secondary | ICD-10-CM | POA: Diagnosis not present

## 2013-01-07 DIAGNOSIS — N186 End stage renal disease: Secondary | ICD-10-CM | POA: Diagnosis not present

## 2013-01-09 ENCOUNTER — Encounter: Payer: Self-pay | Admitting: Surgery

## 2013-01-10 DIAGNOSIS — N186 End stage renal disease: Secondary | ICD-10-CM | POA: Diagnosis not present

## 2013-01-10 DIAGNOSIS — D631 Anemia in chronic kidney disease: Secondary | ICD-10-CM | POA: Diagnosis not present

## 2013-01-10 DIAGNOSIS — N2581 Secondary hyperparathyroidism of renal origin: Secondary | ICD-10-CM | POA: Diagnosis not present

## 2013-01-12 ENCOUNTER — Ambulatory Visit: Payer: Medicare Other | Admitting: Surgery

## 2013-01-30 ENCOUNTER — Encounter: Payer: Self-pay | Admitting: Surgery

## 2013-02-02 ENCOUNTER — Ambulatory Visit (INDEPENDENT_AMBULATORY_CARE_PROVIDER_SITE_OTHER): Payer: Medicare Other | Admitting: Surgery

## 2013-02-02 ENCOUNTER — Encounter: Payer: Self-pay | Admitting: Surgery

## 2013-02-02 VITALS — BP 149/89 | HR 91 | Ht 69.0 in | Wt 177.0 lb

## 2013-02-02 DIAGNOSIS — N186 End stage renal disease: Secondary | ICD-10-CM

## 2013-02-02 NOTE — Progress Notes (Signed)
Patient name: Nathaniel White MRN: [id number] DOB: Jun 26, 1960 Sex: male     Chief Complaint  Patient presents with  . Re-evaluation    1 month f/u     HISTORY OF PRESENT ILLNESS: The patient is back today for further followup of a ulcer on his left upper arm fistula. He has an aneurysmal segment of his fistula that has developed a nonhealing ulcer. On 11/05/2012 he was taken for fistulogram. A cephalic vein stenosis was dilated using a 9 mm balloon.  I saw him before Christmas and there had not been significant change.  He wanted to wait until after the holidays in order to proceed with aneurysmorrhaphy and resection of the ulcerated area.  He has no complaints today.   Past Medical History  Diagnosis Date  . Hypertension   . Renal insufficiency   . Anemia   . Stroke   . Hemiparesis due to old stroke     left  . LV dysfunction     EF 25-30% by echo 07/2011  . MR (mitral regurgitation)     moderate to severe, echo 07/2011  . Shortness of breath   . Diabetes mellitus without complication     Past Surgical History  Procedure Laterality Date  . Kidney transplant      2011 rejected kidney 2012 back on dialysis  . Av fistula placement    . Cardiac catheterization      [place] without cardioversion  08/17/2011    Procedure: TRANSESOPHAGEAL ECHOCARDIOGRAM (TEE);  Surgeon: Larey Dresser, MD;  Location: [facility] ENDOSCOPY;  Service: Cardiovascular;  Laterality: N/A;    History   Social History  . Marital Status: Single    Spouse Name: N/A    Number of Children: N/A  . Years of Education: N/A   Occupational History  . Not on file.   Social History Main Topics  . Smoking status: Former Smoker -- 0.50 packs/day    Types: Cigarettes    Quit date: 10/13/2012  . Smokeless tobacco: Not on file  . Alcohol Use: No  . Drug Use: No  . Sexual Activity: Not on file   Other Topics Concern  . Not on file   Social History Narrative  . No narrative on file     Family History  Problem Relation Age of Onset  . Hypertension Mother     Allergies as of 02/02/2013 - Review Complete 02/02/2013  Allergen Reaction Noted  . Codeine Shortness Of Breath 09/29/2008    Current Outpatient Prescriptions on File Prior to Visit  Medication Sig Dispense Refill  . albuterol (PROVENTIL HFA;VENTOLIN HFA) 108 (90 BASE) MCG/ACT inhaler Inhale 1-2 puffs into the lungs every 6 (six) hours as needed for wheezing.  1 Inhaler  0  . amLODipine (NORVASC) 10 MG tablet Take 10 mg by mouth every evening.      . chlorpheniramine-HYDROcodone (TUSSIONEX) 10-8 MG/5ML LQCR Take 5 mLs by mouth every 12 (twelve) hours as needed for cough.  115 mL  0  . clonazePAM (KLONOPIN) 1 MG tablet Take 1 mg by mouth at bedtime.      Marland Kitchen labetalol (NORMODYNE) 300 MG tablet Take 150 mg by mouth 2 (two) times daily.      Marland Kitchen levofloxacin (LEVAQUIN) 500 MG tablet Take 1 tablet (500 mg total) by mouth every other day.  5 tablet  0  . tadalafil (CIALIS) 5 MG tablet Take 5 mg by mouth daily as needed for erectile dysfunction.      Marland Kitchen  traMADol (ULTRAM) 50 MG tablet Take 50 mg by mouth every 6 (six) hours as needed for pain. Take 1-2 tablets by mouth every 6 hours for pain       No current facility-administered medications on file prior to visit.     REVIEW OF SYSTEMS: No changes from prior visit  PHYSICAL EXAMINATION:   Vital signs are BP 149/89  Pulse 91  Ht 5\' 9"  (1.753 m)  Wt 177 lb (80.287 kg)  BMI 26.13 kg/m2  SpO2 95% General: The patient appears their stated age. HEENT:  No gross abnormalities Pulmonary:  Non labored breathing Abdomen: Soft and non-tender Musculoskeletal: There are no major deformities. Neurologic: No focal weakness or paresthesias are detected, Skin: The ulcerated area has not healed Psychiatric: The patient has normal affect. Cardiovascular: Excellent thrill within left upper arm fistula which is aneurysmal.  No ulcers noted   Diagnostic  Studies None  Assessment: End-stage renal disease Plan: The ulcerated area of concern has now healed.  The fistula remains aneurysmal, however there is no absolute indication for proceeding with aneurysmorrhaphy.  I told the patient to contact me should he develop ulceration again.  [name], M.D. [facility] Office: [phone] Pager:  [phone]

## 2013-02-05 DIAGNOSIS — N186 End stage renal disease: Secondary | ICD-10-CM | POA: Diagnosis not present

## 2013-02-05 DIAGNOSIS — E1129 Type 2 diabetes mellitus with other diabetic kidney complication: Secondary | ICD-10-CM | POA: Diagnosis not present

## 2013-02-07 DIAGNOSIS — N186 End stage renal disease: Secondary | ICD-10-CM | POA: Diagnosis not present

## 2013-02-09 ENCOUNTER — Emergency Department (HOSPITAL_BASED_OUTPATIENT_CLINIC_OR_DEPARTMENT_OTHER)
Admission: EM | Admit: 2013-02-09 | Discharge: 2013-02-09 | Disposition: A | Discharge status: Home / Self Care | Payer: Medicare Other | Attending: Emergency Medicine | Admitting: Emergency Medicine

## 2013-02-09 ENCOUNTER — Encounter (HOSPITAL_BASED_OUTPATIENT_CLINIC_OR_DEPARTMENT_OTHER): Payer: Self-pay | Admitting: Emergency Medicine

## 2013-02-09 ENCOUNTER — Emergency Department (HOSPITAL_BASED_OUTPATIENT_CLINIC_OR_DEPARTMENT_OTHER): Payer: Medicare Other

## 2013-02-09 DIAGNOSIS — R0602 Shortness of breath: Secondary | ICD-10-CM | POA: Diagnosis not present

## 2013-02-09 DIAGNOSIS — R059 Cough, unspecified: Secondary | ICD-10-CM | POA: Diagnosis not present

## 2013-02-09 DIAGNOSIS — Z940 Kidney transplant status: Secondary | ICD-10-CM | POA: Insufficient documentation

## 2013-02-09 DIAGNOSIS — I69959 Hemiplegia and hemiparesis following unspecified cerebrovascular disease affecting unspecified side: Secondary | ICD-10-CM | POA: Diagnosis not present

## 2013-02-09 DIAGNOSIS — R05 Cough: Secondary | ICD-10-CM | POA: Diagnosis not present

## 2013-02-09 DIAGNOSIS — J209 Acute bronchitis, unspecified: Secondary | ICD-10-CM | POA: Diagnosis not present

## 2013-02-09 DIAGNOSIS — E119 Type 2 diabetes mellitus without complications: Secondary | ICD-10-CM | POA: Insufficient documentation

## 2013-02-09 DIAGNOSIS — Z862 Personal history of diseases of the blood and blood-forming organs and certain disorders involving the immune mechanism: Secondary | ICD-10-CM | POA: Diagnosis not present

## 2013-02-09 DIAGNOSIS — I120 Hypertensive chronic kidney disease with stage 5 chronic kidney disease or end stage renal disease: Secondary | ICD-10-CM | POA: Insufficient documentation

## 2013-02-09 DIAGNOSIS — Z992 Dependence on renal dialysis: Secondary | ICD-10-CM | POA: Insufficient documentation

## 2013-02-09 DIAGNOSIS — Z9889 Other specified postprocedural states: Secondary | ICD-10-CM | POA: Insufficient documentation

## 2013-02-09 DIAGNOSIS — Z87891 Personal history of nicotine dependence: Secondary | ICD-10-CM | POA: Diagnosis not present

## 2013-02-09 DIAGNOSIS — N186 End stage renal disease: Secondary | ICD-10-CM | POA: Diagnosis not present

## 2013-02-09 DIAGNOSIS — Z79899 Other long term (current) drug therapy: Secondary | ICD-10-CM | POA: Diagnosis not present

## 2013-02-09 DIAGNOSIS — J441 Chronic obstructive pulmonary disease with (acute) exacerbation: Secondary | ICD-10-CM | POA: Diagnosis not present

## 2013-02-09 DIAGNOSIS — I10 Essential (primary) hypertension: Secondary | ICD-10-CM | POA: Diagnosis not present

## 2013-02-09 DIAGNOSIS — J40 Bronchitis, not specified as acute or chronic: Secondary | ICD-10-CM

## 2013-02-09 MED ORDER — ALBUTEROL SULFATE HFA 108 (90 BASE) MCG/ACT IN AERS
INHALATION_SPRAY | RESPIRATORY_TRACT | Status: AC
  Filled 2013-02-09: qty 6.7

## 2013-02-09 MED ORDER — LEVOFLOXACIN 500 MG PO TABS: 500.0000 mg | ORAL_TABLET | ORAL | Status: DC

## 2013-02-09 MED ORDER — LEVOFLOXACIN 500 MG PO TABS
500.0000 mg | ORAL_TABLET | Freq: Once | ORAL | Status: AC
  Administered 2013-02-09: 500 mg via ORAL
  Filled 2013-02-09: qty 1

## 2013-02-09 MED ORDER — ALBUTEROL SULFATE HFA 108 (90 BASE) MCG/ACT IN AERS
2.0000 | INHALATION_SPRAY | RESPIRATORY_TRACT | Status: DC | PRN
  Administered 2013-02-09: 2 via RESPIRATORY_TRACT

## 2013-02-09 MED ORDER — HYDROCOD POLST-CHLORPHEN POLST 10-8 MG/5ML PO LQCR: 5.0000 mL | Freq: Two times a day (BID) | ORAL | Status: DC | PRN

## 2013-02-09 NOTE — Discharge Instructions (Signed)

## 2013-02-09 NOTE — ED Provider Notes (Signed)
CSN: [id number]     Arrival date & time 02/09/13  0013 History   First MD Initiated Contact with Patient 02/09/13 0114     Chief Complaint  Patient presents with  . Cough   (Consider location/radiation/quality/duration/timing/severity/associated sxs/prior Treatment) HPI Comments: Patient presents with cough. He states he's had an ongoing cough for about 11-12 months. He felt like it started after he quit smoking about a year ago. He states it's been worse over the last couple weeks. He has an increase in sputum production. He denies any fevers. He denies any chest pain. He does have some shortness of breath only during coughing spells. He denies any shortness of breath on exertion. He denies any orthopnea. He denies any pedal edema. He was seen here in December for similar episode and states that he got better after he took a course of antibiotics. He does have an extensive medical history including hypertension, end-stage renal disease, prior strokes with left-sided hemiparesis, and COPD. He states he did complete dialysis yesterday.   Past Medical History  Diagnosis Date  . Hypertension   . Renal insufficiency   . Anemia   . Stroke   . Hemiparesis due to old stroke     left  . LV dysfunction     EF 25-30% by echo 07/2011  . MR (mitral regurgitation)     moderate to severe, echo 07/2011  . Shortness of breath   . Diabetes mellitus without complication    Past Surgical History  Procedure Laterality Date  . Kidney transplant      2011 rejected kidney 2012 back on dialysis  . Av fistula placement    . Cardiac catheterization      [place] without cardioversion  08/17/2011    Procedure: TRANSESOPHAGEAL ECHOCARDIOGRAM (TEE);  Surgeon: Larey Dresser, MD;  Location: [facility] ENDOSCOPY;  Service: Cardiovascular;  Laterality: N/A;   Family History  Problem Relation Age of Onset  . Hypertension Mother    History  Substance Use Topics  . Smoking status: Former Smoker -- 0.50  packs/day    Types: Cigarettes    Quit date: 10/13/2012  . Smokeless tobacco: Not on file  . Alcohol Use: No    Review of Systems  Constitutional: Negative for fever, chills, diaphoresis and fatigue.  HENT: Negative for congestion, rhinorrhea and sneezing.   Eyes: Negative.   Respiratory: Positive for cough and shortness of breath. Negative for chest tightness.   Cardiovascular: Negative for chest pain and leg swelling.  Gastrointestinal: Negative for nausea, vomiting, abdominal pain, diarrhea and blood in stool.  Genitourinary: Negative for frequency, hematuria, flank pain and difficulty urinating.  Musculoskeletal: Negative for arthralgias and back pain.  Skin: Negative for rash.  Neurological: Negative for dizziness, speech difficulty, weakness, numbness and headaches.    Allergies  Codeine  Home Medications   Current Outpatient Rx  Name  Route  Sig  Dispense  Refill  . albuterol (PROVENTIL HFA;VENTOLIN HFA) 108 (90 BASE) MCG/ACT inhaler   Inhalation   Inhale 1-2 puffs into the lungs every 6 (six) hours as needed for wheezing.   1 Inhaler   0   . labetalol (NORMODYNE) 300 MG tablet   Oral   Take 150 mg by mouth 2 (two) times daily.         . tadalafil (CIALIS) 5 MG tablet   Oral   Take 5 mg by mouth daily as needed for erectile dysfunction.         Marland Kitchen  traMADol (ULTRAM) 50 MG tablet   Oral   Take 50 mg by mouth every 6 (six) hours as needed for pain. Take 1-2 tablets by mouth every 6 hours for pain         . amLODipine (NORVASC) 10 MG tablet   Oral   Take 10 mg by mouth every evening.         . chlorpheniramine-HYDROcodone (TUSSIONEX PENNKINETIC ER) 10-8 MG/5ML LQCR   Oral   Take 5 mLs by mouth every 12 (twelve) hours as needed for cough.   115 mL   0   . chlorpheniramine-HYDROcodone (TUSSIONEX) 10-8 MG/5ML LQCR   Oral   Take 5 mLs by mouth every 12 (twelve) hours as needed for cough.   115 mL   0   . clonazePAM (KLONOPIN) 1 MG tablet   Oral    Take 1 mg by mouth at bedtime.         Marland Kitchen levofloxacin (LEVAQUIN) 500 MG tablet   Oral   Take 1 tablet (500 mg total) by mouth every other day.   5 tablet   0   . levofloxacin (LEVAQUIN) 500 MG tablet   Oral   Take 1 tablet (500 mg total) by mouth every other day.   7 tablet   0    BP 173/107  Pulse 104  Temp(Src) 98.5 F (36.9 C) (Oral)  Resp 20  Ht 5\' 9"  (1.753 m)  Wt 180 lb (81.647 kg)  BMI 26.57 kg/m2  SpO2 95% Physical Exam  Constitutional: He is oriented to person, place, and time. He appears well-developed and well-nourished.  HENT:  Head: Normocephalic and atraumatic.  Eyes: Pupils are equal, round, and reactive to light.  Neck: Normal range of motion. Neck supple.  Cardiovascular: Normal rate, regular rhythm and normal heart sounds.   Pulmonary/Chest: Effort normal. No respiratory distress. He has wheezes (. Scant expiratory wheezes. No increased work of breathing.). He has no rales. He exhibits no tenderness.  Abdominal: Soft. Bowel sounds are normal. There is no tenderness. There is no rebound and no guarding.  Musculoskeletal: Normal range of motion. He exhibits no edema.  No pedal edema. No calf tenderness.  Lymphadenopathy:    He has no cervical adenopathy.  Neurological: He is alert and oriented to person, place, and time.  Skin: Skin is warm and dry. No rash noted.  Psychiatric: He has a normal mood and affect.    ED Course  Procedures (including critical care time) Labs Review Labs Reviewed - No data to display Imaging Review Dg Chest 2 View  02/09/2013   CLINICAL DATA:  Cough and shortness of breath.  EXAM: CHEST  2 VIEW  COMPARISON:  PA and lateral chest 12/10/2012 09/26/2012 10/03/2011.  FINDINGS: Small, chronic right pleural effusion and scarring in the right mid and lower lung zones is unchanged. The left lung is clear. There is cardiomegaly without edema. No pneumothorax.  IMPRESSION: No acute finding.  Stable compared to prior exams.    Electronically Signed   By: Inge Rise M.D.   On: 02/09/2013 01:30    EKG Interpretation   None       MDM   1. Bronchitis    Patient presents with productive cough. He denies any shortness of breath other than during coughing spells. He has no other symptoms suggestive of pulmonary embolus. His oxygen saturation is normal. He has no evidence of pneumonia on chest x-ray. There is no evidence of fluid overload. He has no chest  pain no edema are consistent with acute coronary syndrome. He has not missed any dialysis sessions. Given his increased sputum production with worsening cough, I did go ahead and treat him as a possible bronchitis with Levaquin. The Levaquin was renally dosed. He was also given a prescription for Tussionex cough medicine. He was given outpatient referral for followup of his COPD clinic. He was also advised to followup with his nephrologist.    Malvin Johns, MD 02/09/13 [phone]

## 2013-02-09 NOTE — ED Notes (Signed)
Pt reports productive cough with white sputum, shortness of breath - states he has had the cough for 4 months but it worsened tonight - pt has weakness on the left from previous stroke - pt on HD - T Thurs Sat -  Access to Left Arm.

## 2013-02-10 DIAGNOSIS — N186 End stage renal disease: Secondary | ICD-10-CM | POA: Diagnosis not present

## 2013-02-10 DIAGNOSIS — D631 Anemia in chronic kidney disease: Secondary | ICD-10-CM | POA: Diagnosis not present

## 2013-02-10 DIAGNOSIS — N2581 Secondary hyperparathyroidism of renal origin: Secondary | ICD-10-CM | POA: Diagnosis not present

## 2013-02-18 ENCOUNTER — Other Ambulatory Visit: Payer: Self-pay | Admitting: Nephrology

## 2013-02-18 ENCOUNTER — Ambulatory Visit
Admission: RE | Admit: 2013-02-18 | Discharge: 2013-02-18 | Disposition: A | Discharge status: Home / Self Care | Payer: Medicare Other | Source: Ambulatory Visit | Attending: Nephrology | Admitting: Nephrology

## 2013-02-18 DIAGNOSIS — R05 Cough: Secondary | ICD-10-CM

## 2013-02-18 DIAGNOSIS — R0602 Shortness of breath: Secondary | ICD-10-CM

## 2013-02-18 DIAGNOSIS — R059 Cough, unspecified: Secondary | ICD-10-CM

## 2013-02-18 DIAGNOSIS — J984 Other disorders of lung: Secondary | ICD-10-CM | POA: Diagnosis not present

## 2013-03-03 ENCOUNTER — Emergency Department (HOSPITAL_BASED_OUTPATIENT_CLINIC_OR_DEPARTMENT_OTHER): Payer: Medicare Other

## 2013-03-03 ENCOUNTER — Encounter (HOSPITAL_BASED_OUTPATIENT_CLINIC_OR_DEPARTMENT_OTHER): Payer: Self-pay | Admitting: Emergency Medicine

## 2013-03-03 ENCOUNTER — Emergency Department (HOSPITAL_BASED_OUTPATIENT_CLINIC_OR_DEPARTMENT_OTHER)
Admission: EM | Admit: 2013-03-03 | Discharge: 2013-03-03 | Discharge status: Against Medical Advice | Payer: Medicare Other | Attending: Emergency Medicine | Admitting: Emergency Medicine

## 2013-03-03 DIAGNOSIS — R05 Cough: Secondary | ICD-10-CM | POA: Diagnosis not present

## 2013-03-03 DIAGNOSIS — Z862 Personal history of diseases of the blood and blood-forming organs and certain disorders involving the immune mechanism: Secondary | ICD-10-CM | POA: Insufficient documentation

## 2013-03-03 DIAGNOSIS — R062 Wheezing: Secondary | ICD-10-CM | POA: Insufficient documentation

## 2013-03-03 DIAGNOSIS — Z792 Long term (current) use of antibiotics: Secondary | ICD-10-CM | POA: Insufficient documentation

## 2013-03-03 DIAGNOSIS — Z87448 Personal history of other diseases of urinary system: Secondary | ICD-10-CM | POA: Insufficient documentation

## 2013-03-03 DIAGNOSIS — Z87891 Personal history of nicotine dependence: Secondary | ICD-10-CM | POA: Insufficient documentation

## 2013-03-03 DIAGNOSIS — E119 Type 2 diabetes mellitus without complications: Secondary | ICD-10-CM | POA: Insufficient documentation

## 2013-03-03 DIAGNOSIS — I509 Heart failure, unspecified: Secondary | ICD-10-CM | POA: Diagnosis not present

## 2013-03-03 DIAGNOSIS — Z79899 Other long term (current) drug therapy: Secondary | ICD-10-CM | POA: Diagnosis not present

## 2013-03-03 DIAGNOSIS — Z9889 Other specified postprocedural states: Secondary | ICD-10-CM | POA: Diagnosis not present

## 2013-03-03 DIAGNOSIS — I10 Essential (primary) hypertension: Secondary | ICD-10-CM | POA: Insufficient documentation

## 2013-03-03 DIAGNOSIS — Z8673 Personal history of transient ischemic attack (TIA), and cerebral infarction without residual deficits: Secondary | ICD-10-CM | POA: Insufficient documentation

## 2013-03-03 DIAGNOSIS — R059 Cough, unspecified: Secondary | ICD-10-CM | POA: Diagnosis not present

## 2013-03-03 LAB — CBC WITH DIFFERENTIAL/PLATELET
BASOS ABS: 0.1 10*3/uL (ref 0.0–0.1)
BASOS PCT: 1 % (ref 0–1)
Eosinophils Absolute: 0.5 10*3/uL (ref 0.0–0.7)
Eosinophils Relative: 8 % — ABNORMAL HIGH (ref 0–5)
HCT: 27.3 % — ABNORMAL LOW (ref 39.0–52.0)
Hemoglobin: 8.7 g/dL — ABNORMAL LOW (ref 13.0–17.0)
Lymphocytes Relative: 18 % (ref 12–46)
Lymphs Abs: 1 10*3/uL (ref 0.7–4.0)
MCH: 30.2 pg (ref 26.0–34.0)
MCHC: 31.9 g/dL (ref 30.0–36.0)
MCV: 94.8 fL (ref 78.0–100.0)
Monocytes Absolute: 0.6 10*3/uL (ref 0.1–1.0)
Monocytes Relative: 10 % (ref 3–12)
NEUTROS PCT: 63 % (ref 43–77)
Neutro Abs: 3.6 10*3/uL (ref 1.7–7.7)
Platelets: 159 10*3/uL (ref 150–400)
RBC: 2.88 MIL/uL — ABNORMAL LOW (ref 4.22–5.81)
RDW: 16.6 % — AB (ref 11.5–15.5)
WBC: 5.8 10*3/uL (ref 4.0–10.5)

## 2013-03-03 LAB — BASIC METABOLIC PANEL
BUN: 89 mg/dL — AB (ref 6–23)
CO2: 27 mEq/L (ref 19–32)
Calcium: 10.5 mg/dL (ref 8.4–10.5)
Chloride: 92 mEq/L — ABNORMAL LOW (ref 96–112)
Creatinine, Ser: 11.5 mg/dL — ABNORMAL HIGH (ref 0.50–1.35)
GFR calc non Af Amer: 4 mL/min — ABNORMAL LOW (ref >90)
GFR, EST AFRICAN AMERICAN: 5 mL/min — AB (ref >90)
Glucose, Bld: 111 mg/dL — ABNORMAL HIGH (ref 70–99)
POTASSIUM: 5.6 meq/L — AB (ref 3.7–5.3)
Sodium: 138 mEq/L (ref 137–147)

## 2013-03-03 LAB — TROPONIN I

## 2013-03-03 LAB — PRO B NATRIURETIC PEPTIDE: Pro B Natriuretic peptide (BNP): >70000 pg/mL — ABNORMAL HIGH (ref 0–125)

## 2013-03-03 MED ORDER — ALBUTEROL SULFATE (2.5 MG/3ML) 0.083% IN NEBU
5.0000 mg | INHALATION_SOLUTION | Freq: Once | RESPIRATORY_TRACT | Status: AC
  Administered 2013-03-03: 5 mg via RESPIRATORY_TRACT
  Filled 2013-03-03: qty 6

## 2013-03-03 MED ORDER — FUROSEMIDE 10 MG/ML IJ SOLN
40.0000 mg | Freq: Once | INTRAMUSCULAR | Status: DC
  Filled 2013-03-03: qty 4

## 2013-03-03 NOTE — ED Notes (Signed)
Pt c/o cough x 20 days seen here for same DX bronchitis f/u xray done by PMD

## 2013-03-03 NOTE — ED Notes (Signed)
Patient transported to X-ray 

## 2013-03-03 NOTE — ED Provider Notes (Signed)
CSN: [id number]     Arrival date & time 03/03/13  0020 History   First MD Initiated Contact with Patient 03/03/13 0100     Chief Complaint  Patient presents with  . Cough     (Consider location/radiation/quality/duration/timing/severity/associated sxs/prior Treatment) Patient is a 53 y.o. male presenting with cough. The history is provided by the patient.  Cough Cough characteristics:  Non-productive Severity:  Moderate Onset quality:  Gradual Duration:  51 weeks Timing:  Intermittent Progression:  Unchanged Chronicity:  New Smoker: no   Context: not animal exposure   Relieved by:  Nothing Worsened by:  Nothing tried Ineffective treatments:  None tried Associated symptoms: wheezing   Associated symptoms: no chest pain, no diaphoresis, no fever, no headaches, no rash and no rhinorrhea   Risk factors: no chemical exposure     Past Medical History  Diagnosis Date  . Hypertension   . Renal insufficiency   . Anemia   . Stroke   . Hemiparesis due to old stroke     left  . LV dysfunction     EF 25-30% by echo 07/2011  . MR (mitral regurgitation)     moderate to severe, echo 07/2011  . Shortness of breath   . Diabetes mellitus without complication    Past Surgical History  Procedure Laterality Date  . Kidney transplant      2011 rejected kidney 2012 back on dialysis  . Av fistula placement    . Cardiac catheterization      [place] without cardioversion  08/17/2011    Procedure: TRANSESOPHAGEAL ECHOCARDIOGRAM (TEE);  Surgeon: Larey Dresser, MD;  Location: [facility] ENDOSCOPY;  Service: Cardiovascular;  Laterality: N/A;   Family History  Problem Relation Age of Onset  . Hypertension Mother    History  Substance Use Topics  . Smoking status: Former Smoker -- 0.50 packs/day    Types: Cigarettes    Quit date: 10/13/2012  . Smokeless tobacco: Not on file  . Alcohol Use: No    Review of Systems  Constitutional: Negative for fever and diaphoresis.  HENT:  Negative for rhinorrhea.   Respiratory: Positive for cough and wheezing.   Cardiovascular: Negative for chest pain.  Skin: Negative for rash.  Neurological: Negative for headaches.  All other systems reviewed and are negative.      Allergies  Codeine  Home Medications   Current Outpatient Rx  Name  Route  Sig  Dispense  Refill  . albuterol (PROVENTIL HFA;VENTOLIN HFA) 108 (90 BASE) MCG/ACT inhaler   Inhalation   Inhale 1-2 puffs into the lungs every 6 (six) hours as needed for wheezing.   1 Inhaler   0   . amLODipine (NORVASC) 10 MG tablet   Oral   Take 10 mg by mouth every evening.         . chlorpheniramine-HYDROcodone (TUSSIONEX PENNKINETIC ER) 10-8 MG/5ML LQCR   Oral   Take 5 mLs by mouth every 12 (twelve) hours as needed for cough.   115 mL   0   . chlorpheniramine-HYDROcodone (TUSSIONEX) 10-8 MG/5ML LQCR   Oral   Take 5 mLs by mouth every 12 (twelve) hours as needed for cough.   115 mL   0   . clonazePAM (KLONOPIN) 1 MG tablet   Oral   Take 1 mg by mouth at bedtime.         Marland Kitchen labetalol (NORMODYNE) 300 MG tablet   Oral   Take 150 mg by mouth 2 (two)  times daily.         Marland Kitchen levofloxacin (LEVAQUIN) 500 MG tablet   Oral   Take 1 tablet (500 mg total) by mouth every other day.   5 tablet   0   . levofloxacin (LEVAQUIN) 500 MG tablet   Oral   Take 1 tablet (500 mg total) by mouth every other day.   7 tablet   0   . tadalafil (CIALIS) 5 MG tablet   Oral   Take 5 mg by mouth daily as needed for erectile dysfunction.         . traMADol (ULTRAM) 50 MG tablet   Oral   Take 50 mg by mouth every 6 (six) hours as needed for pain. Take 1-2 tablets by mouth every 6 hours for pain          BP 158/89  Pulse 100  Temp(Src) 98.5 F (36.9 C)  Resp 16  Wt 180 lb (81.647 kg)  SpO2 99% Physical Exam  Constitutional: He is oriented to person, place, and time. He appears well-developed and well-nourished. No distress.  HENT:  Head: Normocephalic and  atraumatic.  Mouth/Throat: Oropharynx is clear and moist.  Eyes: Conjunctivae are normal. Pupils are equal, round, and reactive to light.  Neck: Normal range of motion. Neck supple.  Cardiovascular: Normal rate, regular rhythm and intact distal pulses.   Pulmonary/Chest: Effort normal. No respiratory distress. He has wheezes. He has no rales.  Abdominal: Soft. Bowel sounds are normal. There is no tenderness. There is no rebound and no guarding.  Musculoskeletal: Normal range of motion.  Neurological: He is alert and oriented to person, place, and time.  Skin: Skin is warm and dry.  Psychiatric: He has a normal mood and affect.    ED Course  Procedures (including critical care time) Labs Review Labs Reviewed - No data to display Imaging Review No results found.  EKG Interpretation   None       MDM   Final diagnoses:  None     Date: 03/03/2013  Rate: 104  Rhythm: sinus tachycardia  QRS Axis: normal  Intervals: normal  ST/T Wave abnormalities: nonspecific ST changes  Conduction Disutrbances:none  Narrative Interpretation:   Old EKG Reviewed: unchanged    Patient has decision making capacity to refuse care and signs AMA understanding that the risks of leaving are death morbidity.  He is welcome to return at any time  Oree Hislop Alfonso Patten, MD 03/03/13 [phone]

## 2013-03-07 DIAGNOSIS — N186 End stage renal disease: Secondary | ICD-10-CM | POA: Diagnosis not present

## 2013-03-10 DIAGNOSIS — N2581 Secondary hyperparathyroidism of renal origin: Secondary | ICD-10-CM | POA: Diagnosis not present

## 2013-03-10 DIAGNOSIS — D631 Anemia in chronic kidney disease: Secondary | ICD-10-CM | POA: Diagnosis not present

## 2013-03-10 DIAGNOSIS — N186 End stage renal disease: Secondary | ICD-10-CM | POA: Diagnosis not present

## 2013-04-07 DIAGNOSIS — N186 End stage renal disease: Secondary | ICD-10-CM | POA: Diagnosis not present

## 2013-04-09 DIAGNOSIS — E1129 Type 2 diabetes mellitus with other diabetic kidney complication: Secondary | ICD-10-CM | POA: Diagnosis not present

## 2013-04-09 DIAGNOSIS — N186 End stage renal disease: Secondary | ICD-10-CM | POA: Diagnosis not present

## 2013-04-09 DIAGNOSIS — E8779 Other fluid overload: Secondary | ICD-10-CM | POA: Diagnosis not present

## 2013-04-09 DIAGNOSIS — D631 Anemia in chronic kidney disease: Secondary | ICD-10-CM | POA: Diagnosis not present

## 2013-04-16 ENCOUNTER — Encounter (HOSPITAL_COMMUNITY): Payer: Self-pay | Admitting: Emergency Medicine

## 2013-04-16 ENCOUNTER — Emergency Department (HOSPITAL_COMMUNITY)
Admission: EM | Admit: 2013-04-16 | Discharge: 2013-04-16 | Disposition: A | Discharge status: Home / Self Care | Payer: Medicare Other | Attending: Emergency Medicine | Admitting: Emergency Medicine

## 2013-04-16 DIAGNOSIS — T82898A Other specified complication of vascular prosthetic devices, implants and grafts, initial encounter: Secondary | ICD-10-CM | POA: Diagnosis not present

## 2013-04-16 DIAGNOSIS — E119 Type 2 diabetes mellitus without complications: Secondary | ICD-10-CM | POA: Insufficient documentation

## 2013-04-16 DIAGNOSIS — Y841 Kidney dialysis as the cause of abnormal reaction of the patient, or of later complication, without mention of misadventure at the time of the procedure: Secondary | ICD-10-CM | POA: Insufficient documentation

## 2013-04-16 DIAGNOSIS — N186 End stage renal disease: Secondary | ICD-10-CM | POA: Diagnosis not present

## 2013-04-16 DIAGNOSIS — Z9889 Other specified postprocedural states: Secondary | ICD-10-CM | POA: Diagnosis not present

## 2013-04-16 DIAGNOSIS — Z862 Personal history of diseases of the blood and blood-forming organs and certain disorders involving the immune mechanism: Secondary | ICD-10-CM | POA: Insufficient documentation

## 2013-04-16 DIAGNOSIS — I69959 Hemiplegia and hemiparesis following unspecified cerebrovascular disease affecting unspecified side: Secondary | ICD-10-CM | POA: Insufficient documentation

## 2013-04-16 DIAGNOSIS — Z87891 Personal history of nicotine dependence: Secondary | ICD-10-CM | POA: Insufficient documentation

## 2013-04-16 DIAGNOSIS — IMO0002 Reserved for concepts with insufficient information to code with codable children: Secondary | ICD-10-CM | POA: Diagnosis not present

## 2013-04-16 DIAGNOSIS — I120 Hypertensive chronic kidney disease with stage 5 chronic kidney disease or end stage renal disease: Secondary | ICD-10-CM | POA: Insufficient documentation

## 2013-04-16 DIAGNOSIS — T8131XA Disruption of external operation (surgical) wound, not elsewhere classified, initial encounter: Secondary | ICD-10-CM | POA: Diagnosis not present

## 2013-04-16 DIAGNOSIS — T819XXA Unspecified complication of procedure, initial encounter: Secondary | ICD-10-CM | POA: Diagnosis not present

## 2013-04-16 DIAGNOSIS — T82838A Hemorrhage of vascular prosthetic devices, implants and grafts, initial encounter: Secondary | ICD-10-CM

## 2013-04-16 DIAGNOSIS — Z79899 Other long term (current) drug therapy: Secondary | ICD-10-CM | POA: Insufficient documentation

## 2013-04-16 DIAGNOSIS — I10 Essential (primary) hypertension: Secondary | ICD-10-CM | POA: Diagnosis not present

## 2013-04-16 NOTE — ED Notes (Signed)
PER EMS, pt finished dialysis with no problems, came home and noticed left upper arm fistula bleeding. EMS reports approx "1 pint" of blood. Bleeding controlled at this time with pressure dressing by EMS.

## 2013-04-16 NOTE — ED Provider Notes (Signed)
CSN: [id number]     Arrival date & time 04/16/13  1814 History   First MD Initiated Contact with Patient 04/16/13 1834     Chief Complaint  Patient presents with  . Vascular Access Problem     (Consider location/radiation/quality/duration/timing/severity/associated sxs/prior Treatment) HPI  53 year old male with hx of renal disease currently on T-Th-Sat dialysis presents via EMS from home c/o bleeding at the L upper arm AV fistula.  Pt report he has dialysis today without any problem but once he came home he noticed bleeding from fistula site.  Sts it was oozing blood for approximately 40 min.  Pt did tried to stop bleeding with using maxipad and bath towel without success.  No c/o headache, lightheadness or increasing pain.  EMS was called, and a dressing was applied approximately 15 min ago which controlled bleeding.  Pt currently without complaint.  Report he had a similar episode many years ago that stop with direct pressure.  Pt currently not on any anticoagulants.  Past Medical History  Diagnosis Date  . Hypertension   . Renal insufficiency   . Anemia   . Stroke   . Hemiparesis due to old stroke     left  . LV dysfunction     EF 25-30% by echo 07/2011  . MR (mitral regurgitation)     moderate to severe, echo 07/2011  . Shortness of breath   . Diabetes mellitus without complication    Past Surgical History  Procedure Laterality Date  . Kidney transplant      2011 rejected kidney 2012 back on dialysis  . Av fistula placement    . Cardiac catheterization      [place] without cardioversion  08/17/2011    Procedure: TRANSESOPHAGEAL ECHOCARDIOGRAM (TEE);  Surgeon: Larey Dresser, MD;  Location: [facility] ENDOSCOPY;  Service: Cardiovascular;  Laterality: N/A;   Family History  Problem Relation Age of Onset  . Hypertension Mother    History  Substance Use Topics  . Smoking status: Former Smoker -- 0.50 packs/day    Types: Cigarettes    Quit date: 10/13/2012  .  Smokeless tobacco: Not on file  . Alcohol Use: No    Review of Systems  Constitutional: Negative for fever.  Skin: Positive for wound.  Neurological: Negative for light-headedness, numbness and headaches.  Hematological: Bruises/bleeds easily.      Allergies  Codeine  Home Medications   Current Outpatient Rx  Name  Route  Sig  Dispense  Refill  . albuterol (PROVENTIL HFA;VENTOLIN HFA) 108 (90 BASE) MCG/ACT inhaler   Inhalation   Inhale 1-2 puffs into the lungs every 6 (six) hours as needed for wheezing.   1 Inhaler   0   . amLODipine (NORVASC) 10 MG tablet   Oral   Take 10 mg by mouth every evening.         . clonazePAM (KLONOPIN) 1 MG tablet   Oral   Take 1 mg by mouth at bedtime.         Marland Kitchen labetalol (NORMODYNE) 300 MG tablet   Oral   Take 150 mg by mouth 2 (two) times daily.         . traMADol (ULTRAM) 50 MG tablet   Oral   Take 50 mg by mouth every 6 (six) hours as needed for pain. Take 1-2 tablets by mouth every 6 hours for pain          BP 121/72  Pulse 93  Temp(Src)  98.2 F (36.8 C) (Oral)  Resp 16  SpO2 95% Physical Exam  Constitutional: He appears well-developed and well-nourished. No distress.  HENT:  Head: Atraumatic.  Eyes: Conjunctivae are normal.  Neck: Normal range of motion. Neck supple.  Neurological: He is alert.  Skin: No rash noted.  L upper arm: pressure dressing in place, no active bleeding.  Non tender to palpation. Intact radial pulse with normal grip strength and intact sensation.    Psychiatric: He has a normal mood and affect.    ED Course  Procedures (including critical care time)  6:47 PM Pt report bleeding from AV fistula from dialysis today.  Bleeding is controlled with pressure dressing from EMS.  Will monitor for the next 30 min.  Care discussed with Dr. Rogene Houston.  Pt otherwise without other sxs concerning for acute anemia from bleeding.   7:47 PM Pt has been monitored for the past hr without any bleeding, he  feels comfortable returning home  Pt made aware to return if bleeding persist.  Will continue wearing pressure dressing until tomorrow  Labs Review Labs Reviewed - No data to display Imaging Review No results found.   EKG Interpretation None      MDM   Final diagnoses:  Bleeding from dialysis shunt    BP 135/75  Pulse 87  Temp(Src) 98.2 F (36.8 C) (Oral)  Resp 16  SpO2 97%     Domenic Moras, PA-C 04/16/13 1948

## 2013-04-16 NOTE — ED Notes (Signed)
Pt. Reports bleeding from fistula after finishing dialysis. Bleeding controlled at this time. Pt. Alert and oriented x4. Denies dizziness/lightheadedness. Denies pain.

## 2013-04-16 NOTE — Discharge Instructions (Signed)
Please keep pressure dressing on until tomorrow before removing it.  If bleeding persist, apply direct pressure and return to ER for further care.  Otherwise follow up with your doctor or dialysis schedule appointment as previously scheduled.

## 2013-04-16 NOTE — ED Provider Notes (Signed)
Medical screening examination/treatment/procedure(s) were performed by non-physician practitioner and as supervising physician I was immediately available for consultation/collaboration.   EKG Interpretation None        Mervin Kung, MD 04/16/13 [phone]

## 2013-04-30 DIAGNOSIS — E1129 Type 2 diabetes mellitus with other diabetic kidney complication: Secondary | ICD-10-CM | POA: Diagnosis not present

## 2013-05-07 DIAGNOSIS — N186 End stage renal disease: Secondary | ICD-10-CM | POA: Diagnosis not present

## 2013-05-09 DIAGNOSIS — N186 End stage renal disease: Secondary | ICD-10-CM | POA: Diagnosis not present

## 2013-05-09 DIAGNOSIS — N2581 Secondary hyperparathyroidism of renal origin: Secondary | ICD-10-CM | POA: Diagnosis not present

## 2013-05-09 DIAGNOSIS — D631 Anemia in chronic kidney disease: Secondary | ICD-10-CM | POA: Diagnosis not present

## 2013-05-09 DIAGNOSIS — E8779 Other fluid overload: Secondary | ICD-10-CM | POA: Diagnosis not present

## 2013-05-09 DIAGNOSIS — E1129 Type 2 diabetes mellitus with other diabetic kidney complication: Secondary | ICD-10-CM | POA: Diagnosis not present

## 2013-05-15 DIAGNOSIS — N186 End stage renal disease: Secondary | ICD-10-CM | POA: Diagnosis not present

## 2013-05-15 DIAGNOSIS — E8779 Other fluid overload: Secondary | ICD-10-CM | POA: Diagnosis not present

## 2013-05-18 ENCOUNTER — Emergency Department (HOSPITAL_BASED_OUTPATIENT_CLINIC_OR_DEPARTMENT_OTHER)
Admission: EM | Admit: 2013-05-18 | Discharge: 2013-05-18 | Discharge status: Against Medical Advice | Payer: Medicare Other | Attending: Emergency Medicine | Admitting: Emergency Medicine

## 2013-05-18 ENCOUNTER — Encounter (HOSPITAL_BASED_OUTPATIENT_CLINIC_OR_DEPARTMENT_OTHER): Payer: Self-pay | Admitting: Emergency Medicine

## 2013-05-18 DIAGNOSIS — I10 Essential (primary) hypertension: Secondary | ICD-10-CM | POA: Diagnosis not present

## 2013-05-18 DIAGNOSIS — T6391XA Toxic effect of contact with unspecified venomous animal, accidental (unintentional), initial encounter: Secondary | ICD-10-CM | POA: Insufficient documentation

## 2013-05-18 DIAGNOSIS — E119 Type 2 diabetes mellitus without complications: Secondary | ICD-10-CM | POA: Diagnosis not present

## 2013-05-18 DIAGNOSIS — T63461A Toxic effect of venom of wasps, accidental (unintentional), initial encounter: Secondary | ICD-10-CM | POA: Insufficient documentation

## 2013-05-18 DIAGNOSIS — Y929 Unspecified place or not applicable: Secondary | ICD-10-CM | POA: Insufficient documentation

## 2013-05-18 DIAGNOSIS — Y939 Activity, unspecified: Secondary | ICD-10-CM | POA: Insufficient documentation

## 2013-05-18 DIAGNOSIS — Z87891 Personal history of nicotine dependence: Secondary | ICD-10-CM | POA: Insufficient documentation

## 2013-05-18 NOTE — ED Notes (Signed)
Pt reports bee sting on forehead and having ha since this time.

## 2013-06-07 DIAGNOSIS — N186 End stage renal disease: Secondary | ICD-10-CM | POA: Diagnosis not present

## 2013-06-09 DIAGNOSIS — N2581 Secondary hyperparathyroidism of renal origin: Secondary | ICD-10-CM | POA: Diagnosis not present

## 2013-06-09 DIAGNOSIS — E1129 Type 2 diabetes mellitus with other diabetic kidney complication: Secondary | ICD-10-CM | POA: Diagnosis not present

## 2013-06-09 DIAGNOSIS — N186 End stage renal disease: Secondary | ICD-10-CM | POA: Diagnosis not present

## 2013-06-09 DIAGNOSIS — D631 Anemia in chronic kidney disease: Secondary | ICD-10-CM | POA: Diagnosis not present

## 2013-07-07 DIAGNOSIS — N186 End stage renal disease: Secondary | ICD-10-CM | POA: Diagnosis not present

## 2013-07-09 DIAGNOSIS — E1129 Type 2 diabetes mellitus with other diabetic kidney complication: Secondary | ICD-10-CM | POA: Diagnosis not present

## 2013-07-09 DIAGNOSIS — N2581 Secondary hyperparathyroidism of renal origin: Secondary | ICD-10-CM | POA: Diagnosis not present

## 2013-07-09 DIAGNOSIS — E8779 Other fluid overload: Secondary | ICD-10-CM | POA: Diagnosis not present

## 2013-07-09 DIAGNOSIS — N039 Chronic nephritic syndrome with unspecified morphologic changes: Secondary | ICD-10-CM | POA: Diagnosis not present

## 2013-07-09 DIAGNOSIS — D631 Anemia in chronic kidney disease: Secondary | ICD-10-CM | POA: Diagnosis not present

## 2013-07-09 DIAGNOSIS — N186 End stage renal disease: Secondary | ICD-10-CM | POA: Diagnosis not present

## 2013-07-11 DIAGNOSIS — N186 End stage renal disease: Secondary | ICD-10-CM | POA: Diagnosis not present

## 2013-07-11 DIAGNOSIS — N2581 Secondary hyperparathyroidism of renal origin: Secondary | ICD-10-CM | POA: Diagnosis not present

## 2013-07-11 DIAGNOSIS — E8779 Other fluid overload: Secondary | ICD-10-CM | POA: Diagnosis not present

## 2013-07-11 DIAGNOSIS — D631 Anemia in chronic kidney disease: Secondary | ICD-10-CM | POA: Diagnosis not present

## 2013-07-11 DIAGNOSIS — E1129 Type 2 diabetes mellitus with other diabetic kidney complication: Secondary | ICD-10-CM | POA: Diagnosis not present

## 2013-07-11 DIAGNOSIS — N039 Chronic nephritic syndrome with unspecified morphologic changes: Secondary | ICD-10-CM | POA: Diagnosis not present

## 2013-07-14 DIAGNOSIS — E8779 Other fluid overload: Secondary | ICD-10-CM | POA: Diagnosis not present

## 2013-07-14 DIAGNOSIS — E1129 Type 2 diabetes mellitus with other diabetic kidney complication: Secondary | ICD-10-CM | POA: Diagnosis not present

## 2013-07-14 DIAGNOSIS — N2581 Secondary hyperparathyroidism of renal origin: Secondary | ICD-10-CM | POA: Diagnosis not present

## 2013-07-14 DIAGNOSIS — N039 Chronic nephritic syndrome with unspecified morphologic changes: Secondary | ICD-10-CM | POA: Diagnosis not present

## 2013-07-14 DIAGNOSIS — D631 Anemia in chronic kidney disease: Secondary | ICD-10-CM | POA: Diagnosis not present

## 2013-07-14 DIAGNOSIS — N186 End stage renal disease: Secondary | ICD-10-CM | POA: Diagnosis not present

## 2013-07-16 DIAGNOSIS — N2581 Secondary hyperparathyroidism of renal origin: Secondary | ICD-10-CM | POA: Diagnosis not present

## 2013-07-16 DIAGNOSIS — E8779 Other fluid overload: Secondary | ICD-10-CM | POA: Diagnosis not present

## 2013-07-16 DIAGNOSIS — D631 Anemia in chronic kidney disease: Secondary | ICD-10-CM | POA: Diagnosis not present

## 2013-07-16 DIAGNOSIS — N039 Chronic nephritic syndrome with unspecified morphologic changes: Secondary | ICD-10-CM | POA: Diagnosis not present

## 2013-07-16 DIAGNOSIS — N186 End stage renal disease: Secondary | ICD-10-CM | POA: Diagnosis not present

## 2013-07-16 DIAGNOSIS — E1129 Type 2 diabetes mellitus with other diabetic kidney complication: Secondary | ICD-10-CM | POA: Diagnosis not present

## 2013-07-18 DIAGNOSIS — E8779 Other fluid overload: Secondary | ICD-10-CM | POA: Diagnosis not present

## 2013-07-18 DIAGNOSIS — N2581 Secondary hyperparathyroidism of renal origin: Secondary | ICD-10-CM | POA: Diagnosis not present

## 2013-07-18 DIAGNOSIS — N186 End stage renal disease: Secondary | ICD-10-CM | POA: Diagnosis not present

## 2013-07-18 DIAGNOSIS — E1129 Type 2 diabetes mellitus with other diabetic kidney complication: Secondary | ICD-10-CM | POA: Diagnosis not present

## 2013-07-18 DIAGNOSIS — N039 Chronic nephritic syndrome with unspecified morphologic changes: Secondary | ICD-10-CM | POA: Diagnosis not present

## 2013-07-18 DIAGNOSIS — D631 Anemia in chronic kidney disease: Secondary | ICD-10-CM | POA: Diagnosis not present

## 2013-07-20 ENCOUNTER — Emergency Department (HOSPITAL_BASED_OUTPATIENT_CLINIC_OR_DEPARTMENT_OTHER): Payer: Medicare Other

## 2013-07-20 ENCOUNTER — Emergency Department (HOSPITAL_BASED_OUTPATIENT_CLINIC_OR_DEPARTMENT_OTHER)
Admission: EM | Admit: 2013-07-20 | Discharge: 2013-07-20 | Disposition: A | Discharge status: Home / Self Care | Payer: Medicare Other | Attending: Emergency Medicine | Admitting: Emergency Medicine

## 2013-07-20 ENCOUNTER — Encounter (HOSPITAL_BASED_OUTPATIENT_CLINIC_OR_DEPARTMENT_OTHER): Payer: Self-pay | Admitting: Emergency Medicine

## 2013-07-20 DIAGNOSIS — Z9889 Other specified postprocedural states: Secondary | ICD-10-CM | POA: Insufficient documentation

## 2013-07-20 DIAGNOSIS — R05 Cough: Secondary | ICD-10-CM | POA: Insufficient documentation

## 2013-07-20 DIAGNOSIS — Z940 Kidney transplant status: Secondary | ICD-10-CM | POA: Diagnosis not present

## 2013-07-20 DIAGNOSIS — Z8673 Personal history of transient ischemic attack (TIA), and cerebral infarction without residual deficits: Secondary | ICD-10-CM | POA: Diagnosis not present

## 2013-07-20 DIAGNOSIS — M7989 Other specified soft tissue disorders: Secondary | ICD-10-CM | POA: Insufficient documentation

## 2013-07-20 DIAGNOSIS — Z862 Personal history of diseases of the blood and blood-forming organs and certain disorders involving the immune mechanism: Secondary | ICD-10-CM | POA: Diagnosis not present

## 2013-07-20 DIAGNOSIS — Z87891 Personal history of nicotine dependence: Secondary | ICD-10-CM | POA: Insufficient documentation

## 2013-07-20 DIAGNOSIS — R059 Cough, unspecified: Secondary | ICD-10-CM | POA: Insufficient documentation

## 2013-07-20 DIAGNOSIS — N186 End stage renal disease: Secondary | ICD-10-CM | POA: Diagnosis not present

## 2013-07-20 DIAGNOSIS — Z79899 Other long term (current) drug therapy: Secondary | ICD-10-CM | POA: Diagnosis not present

## 2013-07-20 DIAGNOSIS — J811 Chronic pulmonary edema: Secondary | ICD-10-CM | POA: Diagnosis not present

## 2013-07-20 DIAGNOSIS — I059 Rheumatic mitral valve disease, unspecified: Secondary | ICD-10-CM | POA: Insufficient documentation

## 2013-07-20 DIAGNOSIS — E875 Hyperkalemia: Secondary | ICD-10-CM | POA: Insufficient documentation

## 2013-07-20 DIAGNOSIS — Z992 Dependence on renal dialysis: Secondary | ICD-10-CM | POA: Diagnosis not present

## 2013-07-20 DIAGNOSIS — E119 Type 2 diabetes mellitus without complications: Secondary | ICD-10-CM | POA: Diagnosis not present

## 2013-07-20 DIAGNOSIS — I120 Hypertensive chronic kidney disease with stage 5 chronic kidney disease or end stage renal disease: Secondary | ICD-10-CM | POA: Insufficient documentation

## 2013-07-20 DIAGNOSIS — I509 Heart failure, unspecified: Secondary | ICD-10-CM | POA: Diagnosis not present

## 2013-07-20 LAB — BASIC METABOLIC PANEL
ANION GAP: 22 — AB (ref 5–15)
BUN: 75 mg/dL — ABNORMAL HIGH (ref 6–23)
CHLORIDE: 94 meq/L — AB (ref 96–112)
CO2: 23 meq/L (ref 19–32)
CREATININE: 11.9 mg/dL — AB (ref 0.50–1.35)
Calcium: 10.2 mg/dL (ref 8.4–10.5)
GFR calc Af Amer: 5 mL/min — ABNORMAL LOW (ref >90)
GFR calc non Af Amer: 4 mL/min — ABNORMAL LOW (ref >90)
Glucose, Bld: 90 mg/dL (ref 70–99)
POTASSIUM: 6.5 meq/L — AB (ref 3.7–5.3)
SODIUM: 139 meq/L (ref 137–147)

## 2013-07-20 MED ORDER — SODIUM CHLORIDE 0.9 % IV SOLN: INTRAVENOUS | Status: DC

## 2013-07-20 MED ORDER — SODIUM POLYSTYRENE SULFONATE 15 GM/60ML PO SUSP
30.0000 g | Freq: Once | ORAL | Status: AC
  Administered 2013-07-20: 30 g via ORAL
  Filled 2013-07-20: qty 120

## 2013-07-20 MED ORDER — HYDROCOD POLST-CHLORPHEN POLST 10-8 MG/5ML PO LQCR: 5.0000 mL | Freq: Two times a day (BID) | ORAL | Status: DC | PRN

## 2013-07-20 NOTE — ED Provider Notes (Signed)
Medical screening examination/treatment/procedure(s) were conducted as a shared visit with non-physician practitioner(s) and myself.  I personally evaluated the patient during the encounter.   EKG Interpretation   Date/Time:  Monday July 20 2013 14:38:37 EDT Ventricular Rate:  85 PR Interval:  190 QRS Duration: 120 QT Interval:  416 QTC Calculation: 495 R Axis:   73 Text Interpretation:  Sinus rhythm with occasional Premature ventricular  complexes Left ventricular hypertrophy with QRS widening ST \\T \ T wave  abnormality, consider lateral ischemia Abnormal ECG No significant change  since last tracing Confirmed by Lavoris Sparling  MD, Nneoma Harral ([id number]) on 07/20/2013  2:46:58 PM     Patient here not productive cough and chest x-ray consistent with mild heart failure. He has no signs of hypoxia. Potassium is elevated at 6.5 and was given Kayexalate. His EKG is unchanged from prior. We'll speak with his nephrologist and patient likely to be discharged to have his dialysis in the morning  Leota Jacobsen, MD 07/20/13 1458

## 2013-07-20 NOTE — ED Notes (Signed)
Patient transported to X-ray 

## 2013-07-20 NOTE — ED Provider Notes (Signed)
CSN: [id number]     Arrival date & time 07/20/13  1234 History   First MD Initiated Contact with Patient 07/20/13 1251     Chief Complaint  Patient presents with  . Shortness of Breath     (Consider location/radiation/quality/duration/timing/severity/associated sxs/prior Treatment) Patient is a 53 y.o. male presenting with shortness of breath. The history is provided by the patient. No language interpreter was used.  Shortness of Breath Severity:  Moderate Associated symptoms: cough   Associated symptoms: no abdominal pain, no chest pain, no fever, no sore throat and no vomiting   Associated symptoms comment:  He presents for treatment of cough that is dry, persistent and not associated with fever, sore throat, congestion or other symptoms. He reports he is a dialysis patient and is compliant with T, Th, Sat dialysis schedule. He is scheduled for treatment first thing in the morning, which he had moved up to earlier in the day because of his shortness of breath and feeling fluid overloaded. He also reports mild lower extremity edema without pain.   Past Medical History  Diagnosis Date  . Hypertension   . Renal insufficiency   . Anemia   . Stroke   . Hemiparesis due to old stroke     left  . LV dysfunction     EF 25-30% by echo 07/2011  . MR (mitral regurgitation)     moderate to severe, echo 07/2011  . Shortness of breath   . Diabetes mellitus without complication    Past Surgical History  Procedure Laterality Date  . Kidney transplant      2011 rejected kidney 2012 back on dialysis  . Av fistula placement    . Cardiac catheterization      [place] without cardioversion  08/17/2011    Procedure: TRANSESOPHAGEAL ECHOCARDIOGRAM (TEE);  Surgeon: Larey Dresser, MD;  Location: [facility] ENDOSCOPY;  Service: Cardiovascular;  Laterality: N/A;   Family History  Problem Relation Age of Onset  . Hypertension Mother    History  Substance Use Topics  . Smoking status:  Former Smoker -- 0.50 packs/day    Types: Cigarettes    Quit date: 10/13/2012  . Smokeless tobacco: Not on file  . Alcohol Use: No    Review of Systems  Constitutional: Negative for fever and chills.  HENT: Negative.  Negative for congestion, sinus pressure and sore throat.   Respiratory: Positive for cough and shortness of breath.   Cardiovascular: Positive for leg swelling. Negative for chest pain.  Gastrointestinal: Negative.  Negative for nausea, vomiting and abdominal pain.  Musculoskeletal: Negative.  Negative for myalgias.  Skin: Negative.   Neurological: Negative.       Allergies  Codeine  Home Medications   Prior to Admission medications   Medication Sig Start Date End Date Taking? Authorizing Provider  albuterol (PROVENTIL HFA;VENTOLIN HFA) 108 (90 BASE) MCG/ACT inhaler Inhale 1-2 puffs into the lungs every 6 (six) hours as needed for wheezing. 07/01/12   April K Palumbo-Rasch, MD  amLODipine (NORVASC) 10 MG tablet Take 10 mg by mouth every evening.    Historical Provider, MD  clonazePAM (KLONOPIN) 1 MG tablet Take 1 mg by mouth at bedtime.    Historical Provider, MD  labetalol (NORMODYNE) 300 MG tablet Take 150 mg by mouth 2 (two) times daily.    Historical Provider, MD  traMADol (ULTRAM) 50 MG tablet Take 50 mg by mouth every 6 (six) hours as needed for pain. Take 1-2 tablets by mouth every  6 hours for pain    Historical Provider, MD   BP 125/82  Pulse 92  Temp(Src) 97.6 F (36.4 C) (Oral)  Resp 16  Ht 5\' 9"  (1.753 m)  Wt 180 lb (81.647 kg)  BMI 26.57 kg/m2  SpO2 99% Physical Exam  Constitutional: He is oriented to person, place, and time. He appears well-developed and well-nourished.  HENT:  Head: Normocephalic.  Neck: Normal range of motion. Neck supple.  Cardiovascular: Normal rate and regular rhythm.   Pulmonary/Chest: Effort normal. No respiratory distress. He has no wheezes. He has rales.  Abdominal: Soft. Bowel sounds are normal. There is no  tenderness. There is no rebound and no guarding.  Musculoskeletal: Normal range of motion. He exhibits edema.  Neurological: He is alert and oriented to person, place, and time.  Skin: Skin is warm and dry. No rash noted.  Psychiatric: He has a normal mood and affect.    ED Course  Procedures (including critical care time) Labs Review Labs Reviewed  BASIC METABOLIC PANEL - Abnormal; Notable for the following:    Potassium 6.5 (*)    Chloride 94 (*)    BUN 75 (*)    Creatinine, Ser 11.90 (*)    GFR calc non Af Amer 4 (*)    GFR calc Af Amer 5 (*)    Anion gap 22 (*)    All other components within normal limits    Imaging Review Dg Chest 2 View  07/20/2013   CLINICAL DATA:  Progressive shortness of breath with history of renal insufficiency and hypertension and mitral regurgitation.  EXAM: CHEST  2 VIEW  COMPARISON:  PA and lateral chest of March 03, 2013.  FINDINGS: The cardiopericardial silhouette remains enlarged. The pulmonary interstitial markings are mildly increased as compared to the previous study. There are chronic fibrotic changes on the right. Chronic blunting of the right lateral costophrenic angle is demonstrated. The bony thorax is unremarkable.  IMPRESSION: There is mild CHF with interstitial edema. The pulmonary interstitium has deteriorated slightly since the previous study. Chronic changes are present in the right hemi thorax.   Electronically Signed   By: David  Martinique   On: 07/20/2013 13:06     EKG Interpretation None      MDM   Final diagnoses:  None    1. ESRD-HD 2. Cough 3. Hyperkalemia  Discussed patient's condition with Dr. Jonnie Finner of nephrology. Patient given kayexalate. No concerning EKG changes. The patient does not feel SOB on exertion or at rest. No hypoxia. He is scheduled to have dialysis in the morning. Agreed patient can be discharged home and is given return precautions.    Dewaine Oats, PA-C 07/20/13 1511

## 2013-07-20 NOTE — ED Notes (Signed)
Attempted blood draw x 3 unsuccessful

## 2013-07-20 NOTE — Discharge Instructions (Signed)
End-Stage Kidney Disease The kidneys are two organs that lie on either side of the spine between the middle of the back and the front of the abdomen. The kidneys:   Remove wastes and extra water from the blood.   Produce important hormones. These help keep bones strong, regulate blood pressure, and help create red blood cells.   Balance the fluids and chemicals in the blood and tissues. End-stage kidney disease occurs when the kidneys are so damaged that they cannot do their job. When the kidneys cannot do their job, life-threatening problems occur. The body cannot stay clean and strong without the help of the kidneys. In end-stage kidney disease, the kidneys cannot get better.You need a new kidney or treatments to do some of the work healthy kidneys do in order to stay alive. CAUSES  End-stage kidney disease usually occurs when a long-lasting (chronic) kidney disease gets worse. It may also occur after the kidneys are suddenly damaged (acute kidney injury).  SYMPTOMS   Swelling (edema) of the legs, ankles, or feet.   Tiredness (lethargy).   Nausea or vomiting.   Confusion.   Problems with urination, such as:   Decreased urine production.   Frequent urination, especially at night.   Frequent accidents in children who are potty trained.   Muscle twitches and cramps.   Persistent itchiness.   Loss of appetite.   Headaches.   Abnormally dark or light skin.   Numbness in the hands or feet.   Easy bruising.   Frequent hiccups.   Menstruation stops. DIAGNOSIS  Your caregiver will measure your blood pressure and take some tests. These may include:   Urine tests.   Blood tests.   Imaging tests, such as:   An ultrasound exam.   Computed tomography (CT).  A kidney biopsy. TREATMENT  There are two treatments for end-stage kidney disease:   A procedure that removes toxic wastes from the body (dialysis).   Receiving a new kidney (kidney  transplant). Both of these treatments have serious risks and consequences. Your caregiver will help you determine which treatment is best for you based on your health, age, and other factors. In addition to having dialysis or a kidney transplant, you may need to take medicines to control high blood pressure (hypertension) and cholesterol and to decrease phosphorus levels in your blood.  HOME CARE INSTRUCTIONS  Follow your prescribed diet.   Only take over-the-counter or prescription medicines as directed by your caregiver.   Do not take any new medicines (prescription, over-the-counter, or nutritional supplements) unless approved by your caregiver. Many medicines can worsen your kidney damage or need to have the dose adjusted.   Keep all follow-up appointments. MAKE SURE YOU:  Understand these instructions.  Will watch your condition.  Will get help right away if you are not doing well or get worse Document Released: 03/17/2003 Document Revised: 12/12/2011 Document Reviewed: 08/24/2011 [facility] Patient Information 2015 [place], [place]. This information is not intended to replace advice given to you by your health care provider. Make sure you discuss any questions you have with your health care provider.  Cough, Adult  A cough is a reflex that helps clear your throat and airways. It can help heal the body or may be a reaction to an irritated airway. A cough may only last 2 or 3 weeks (acute) or may last more than 8 weeks (chronic).  CAUSES Acute cough:  Viral or bacterial infections. Chronic cough:  Infections.  Allergies.  Asthma.  Post-nasal drip.  Smoking.  Heartburn or acid reflux.  Some medicines.  Chronic lung problems (COPD).  Cancer. SYMPTOMS   Cough.  Fever.  Chest pain.  Increased breathing rate.  High-pitched whistling sound when breathing (wheezing).  Colored mucus that you cough up (sputum). TREATMENT   A bacterial cough may be treated with  antibiotic medicine.  A viral cough must run its course and will not respond to antibiotics.  Your caregiver may recommend other treatments if you have a chronic cough. HOME CARE INSTRUCTIONS   Only take over-the-counter or prescription medicines for pain, discomfort, or fever as directed by your caregiver. Use cough suppressants only as directed by your caregiver.  Use a cold steam vaporizer or humidifier in your bedroom or home to help loosen secretions.  Sleep in a semi-upright position if your cough is worse at night.  Rest as needed.  Stop smoking if you smoke. SEEK IMMEDIATE MEDICAL CARE IF:   You have pus in your sputum.  Your cough starts to worsen.  You cannot control your cough with suppressants and are losing sleep.  You begin coughing up blood.  You have difficulty breathing.  You develop pain which is getting worse or is uncontrolled with medicine.  You have a fever. MAKE SURE YOU:   Understand these instructions.  Will watch your condition.  Will get help right away if you are not doing well or get worse. Document Released: 06/23/2010 Document Revised: 03/19/2011 Document Reviewed: 06/23/2010 [facility] Patient Information 2015 [place], [place]. This information is not intended to replace advice given to you by your health care provider. Make sure you discuss any questions you have with your health care provider.

## 2013-07-20 NOTE — ED Notes (Signed)
C/o "hacking up cough for a while"-SOB last night-dialysis pt-Tu Th Sat-Sat last dialysis

## 2013-07-21 DIAGNOSIS — N2581 Secondary hyperparathyroidism of renal origin: Secondary | ICD-10-CM | POA: Diagnosis not present

## 2013-07-21 DIAGNOSIS — D631 Anemia in chronic kidney disease: Secondary | ICD-10-CM | POA: Diagnosis not present

## 2013-07-21 DIAGNOSIS — N186 End stage renal disease: Secondary | ICD-10-CM | POA: Diagnosis not present

## 2013-07-21 DIAGNOSIS — E8779 Other fluid overload: Secondary | ICD-10-CM | POA: Diagnosis not present

## 2013-07-21 DIAGNOSIS — E1129 Type 2 diabetes mellitus with other diabetic kidney complication: Secondary | ICD-10-CM | POA: Diagnosis not present

## 2013-07-21 NOTE — ED Provider Notes (Signed)
Medical screening examination/treatment/procedure(s) were performed by non-physician practitioner and as supervising physician I was immediately available for consultation/collaboration.   Leota Jacobsen, MD 07/21/13 1012

## 2013-07-23 DIAGNOSIS — D631 Anemia in chronic kidney disease: Secondary | ICD-10-CM | POA: Diagnosis not present

## 2013-07-23 DIAGNOSIS — E8779 Other fluid overload: Secondary | ICD-10-CM | POA: Diagnosis not present

## 2013-07-23 DIAGNOSIS — E1129 Type 2 diabetes mellitus with other diabetic kidney complication: Secondary | ICD-10-CM | POA: Diagnosis not present

## 2013-07-23 DIAGNOSIS — N186 End stage renal disease: Secondary | ICD-10-CM | POA: Diagnosis not present

## 2013-07-23 DIAGNOSIS — N2581 Secondary hyperparathyroidism of renal origin: Secondary | ICD-10-CM | POA: Diagnosis not present

## 2013-07-23 DIAGNOSIS — N039 Chronic nephritic syndrome with unspecified morphologic changes: Secondary | ICD-10-CM | POA: Diagnosis not present

## 2013-07-25 DIAGNOSIS — N186 End stage renal disease: Secondary | ICD-10-CM | POA: Diagnosis not present

## 2013-07-25 DIAGNOSIS — D631 Anemia in chronic kidney disease: Secondary | ICD-10-CM | POA: Diagnosis not present

## 2013-07-25 DIAGNOSIS — N2581 Secondary hyperparathyroidism of renal origin: Secondary | ICD-10-CM | POA: Diagnosis not present

## 2013-07-25 DIAGNOSIS — E8779 Other fluid overload: Secondary | ICD-10-CM | POA: Diagnosis not present

## 2013-07-25 DIAGNOSIS — E1129 Type 2 diabetes mellitus with other diabetic kidney complication: Secondary | ICD-10-CM | POA: Diagnosis not present

## 2013-07-27 ENCOUNTER — Emergency Department (HOSPITAL_BASED_OUTPATIENT_CLINIC_OR_DEPARTMENT_OTHER): Payer: Medicare Other

## 2013-07-27 ENCOUNTER — Emergency Department (HOSPITAL_BASED_OUTPATIENT_CLINIC_OR_DEPARTMENT_OTHER)
Admission: EM | Admit: 2013-07-27 | Discharge: 2013-07-27 | Disposition: A | Discharge status: Home / Self Care | Payer: Medicare Other | Attending: Emergency Medicine | Admitting: Emergency Medicine

## 2013-07-27 ENCOUNTER — Encounter (HOSPITAL_BASED_OUTPATIENT_CLINIC_OR_DEPARTMENT_OTHER): Payer: Self-pay | Admitting: Emergency Medicine

## 2013-07-27 DIAGNOSIS — Z862 Personal history of diseases of the blood and blood-forming organs and certain disorders involving the immune mechanism: Secondary | ICD-10-CM | POA: Insufficient documentation

## 2013-07-27 DIAGNOSIS — Z992 Dependence on renal dialysis: Secondary | ICD-10-CM | POA: Diagnosis not present

## 2013-07-27 DIAGNOSIS — J984 Other disorders of lung: Secondary | ICD-10-CM | POA: Diagnosis not present

## 2013-07-27 DIAGNOSIS — I120 Hypertensive chronic kidney disease with stage 5 chronic kidney disease or end stage renal disease: Secondary | ICD-10-CM | POA: Insufficient documentation

## 2013-07-27 DIAGNOSIS — Z760 Encounter for issue of repeat prescription: Secondary | ICD-10-CM | POA: Insufficient documentation

## 2013-07-27 DIAGNOSIS — Z87891 Personal history of nicotine dependence: Secondary | ICD-10-CM | POA: Diagnosis not present

## 2013-07-27 DIAGNOSIS — Z9889 Other specified postprocedural states: Secondary | ICD-10-CM | POA: Diagnosis not present

## 2013-07-27 DIAGNOSIS — R0989 Other specified symptoms and signs involving the circulatory and respiratory systems: Principal | ICD-10-CM | POA: Insufficient documentation

## 2013-07-27 DIAGNOSIS — Z79899 Other long term (current) drug therapy: Secondary | ICD-10-CM | POA: Diagnosis not present

## 2013-07-27 DIAGNOSIS — R0602 Shortness of breath: Secondary | ICD-10-CM | POA: Diagnosis not present

## 2013-07-27 DIAGNOSIS — R05 Cough: Secondary | ICD-10-CM

## 2013-07-27 DIAGNOSIS — R5383 Other fatigue: Secondary | ICD-10-CM

## 2013-07-27 DIAGNOSIS — R0609 Other forms of dyspnea: Secondary | ICD-10-CM | POA: Insufficient documentation

## 2013-07-27 DIAGNOSIS — E119 Type 2 diabetes mellitus without complications: Secondary | ICD-10-CM | POA: Diagnosis not present

## 2013-07-27 DIAGNOSIS — N186 End stage renal disease: Secondary | ICD-10-CM | POA: Insufficient documentation

## 2013-07-27 DIAGNOSIS — R5381 Other malaise: Secondary | ICD-10-CM | POA: Insufficient documentation

## 2013-07-27 DIAGNOSIS — N19 Unspecified kidney failure: Secondary | ICD-10-CM | POA: Diagnosis not present

## 2013-07-27 DIAGNOSIS — R0600 Dyspnea, unspecified: Secondary | ICD-10-CM

## 2013-07-27 DIAGNOSIS — Z8673 Personal history of transient ischemic attack (TIA), and cerebral infarction without residual deficits: Secondary | ICD-10-CM | POA: Diagnosis not present

## 2013-07-27 DIAGNOSIS — I10 Essential (primary) hypertension: Secondary | ICD-10-CM | POA: Diagnosis not present

## 2013-07-27 DIAGNOSIS — R059 Cough, unspecified: Secondary | ICD-10-CM | POA: Diagnosis not present

## 2013-07-27 DIAGNOSIS — Z940 Kidney transplant status: Secondary | ICD-10-CM | POA: Diagnosis not present

## 2013-07-27 LAB — BASIC METABOLIC PANEL
Anion gap: 22 — ABNORMAL HIGH (ref 5–15)
BUN: 71 mg/dL — ABNORMAL HIGH (ref 6–23)
CHLORIDE: 97 meq/L (ref 96–112)
CO2: 23 mEq/L (ref 19–32)
Calcium: 10.3 mg/dL (ref 8.4–10.5)
Creatinine, Ser: 10.4 mg/dL — ABNORMAL HIGH (ref 0.50–1.35)
GFR calc Af Amer: 6 mL/min — ABNORMAL LOW (ref >90)
GFR, EST NON AFRICAN AMERICAN: 5 mL/min — AB (ref >90)
GLUCOSE: 79 mg/dL (ref 70–99)
Potassium: 6.1 mEq/L — ABNORMAL HIGH (ref 3.7–5.3)
SODIUM: 142 meq/L (ref 137–147)

## 2013-07-27 LAB — TROPONIN I: Troponin I: <0.3 ng/mL (ref <0.30)

## 2013-07-27 LAB — CBC WITH DIFFERENTIAL/PLATELET
BASOS PCT: 1 % (ref 0–1)
Basophils Absolute: 0 10*3/uL (ref 0.0–0.1)
EOS ABS: 0.5 10*3/uL (ref 0.0–0.7)
Eosinophils Relative: 10 % — ABNORMAL HIGH (ref 0–5)
HEMATOCRIT: 33.7 % — AB (ref 39.0–52.0)
Hemoglobin: 11 g/dL — ABNORMAL LOW (ref 13.0–17.0)
LYMPHS PCT: 19 % (ref 12–46)
Lymphs Abs: 0.9 10*3/uL (ref 0.7–4.0)
MCH: 29.8 pg (ref 26.0–34.0)
MCHC: 32.6 g/dL (ref 30.0–36.0)
MCV: 91.3 fL (ref 78.0–100.0)
MONOS PCT: 10 % (ref 3–12)
Monocytes Absolute: 0.5 10*3/uL (ref 0.1–1.0)
NEUTROS PCT: 60 % (ref 43–77)
Neutro Abs: 2.6 10*3/uL (ref 1.7–7.7)
Platelets: 89 10*3/uL — ABNORMAL LOW (ref 150–400)
RBC: 3.69 MIL/uL — AB (ref 4.22–5.81)
RDW: 17.3 % — ABNORMAL HIGH (ref 11.5–15.5)
Smear Review: DECREASED
WBC: 4.5 10*3/uL (ref 4.0–10.5)

## 2013-07-27 MED ORDER — ALBUTEROL SULFATE HFA 108 (90 BASE) MCG/ACT IN AERS
2.0000 | INHALATION_SPRAY | RESPIRATORY_TRACT | Status: DC | PRN
  Administered 2013-07-27: 2 via RESPIRATORY_TRACT
  Filled 2013-07-27: qty 6.7

## 2013-07-27 NOTE — ED Notes (Signed)
States he is SOB. Was here last week for same. Non productive cough. Pain in his left ribs. States he ran out of Albuterol today. Went to get another and did not have a Rx.

## 2013-07-27 NOTE — ED Provider Notes (Signed)
CSN: [id number]     Arrival date & time 07/27/13  1120 History   First MD Initiated Contact with Patient 07/27/13 1129     Chief Complaint  Patient presents with  . Shortness of Breath  . Medication Refill     (Consider location/radiation/quality/duration/timing/severity/associated sxs/prior Treatment) HPI 53 year old male on dialysis Tuesday Thursday Saturdays presents today complaining of dyspnea and cough. He was seen one week ago today on a Monday for similar symptoms. Workup then was normal. Patient states that today he is somewhat different because he does not feel like he was drinking too much fluid this weekend. He states that last weekend he felt like he had taken an excessive amount of fluid and that was why he was short of breath. Dyspnea is worse with exertion. He has not had any fever. Cough is nonproductive. He was given an albuterol MDI on an previous visit and has used all of that. He denies chest pain. He has had some increased peripheral edema. He is taking all of his medication as prescribed.  Past Medical History  Diagnosis Date  . Hypertension   . Renal insufficiency   . Anemia   . Stroke   . Hemiparesis due to old stroke     left  . LV dysfunction     EF 25-30% by echo 07/2011  . MR (mitral regurgitation)     moderate to severe, echo 07/2011  . Shortness of breath   . Diabetes mellitus without complication    Past Surgical History  Procedure Laterality Date  . Kidney transplant      2011 rejected kidney 2012 back on dialysis  . Av fistula placement    . Cardiac catheterization      [place] without cardioversion  08/17/2011    Procedure: TRANSESOPHAGEAL ECHOCARDIOGRAM (TEE);  Surgeon: Larey Dresser, MD;  Location: [facility] ENDOSCOPY;  Service: Cardiovascular;  Laterality: N/A;   Family History  Problem Relation Age of Onset  . Hypertension Mother    History  Substance Use Topics  . Smoking status: Former Smoker -- 0.50 packs/day    Types:  Cigarettes    Quit date: 10/13/2012  . Smokeless tobacco: Not on file  . Alcohol Use: No    Review of Systems  Constitutional: Negative.   HENT: Negative.   Eyes: Negative.   Respiratory: Positive for cough and shortness of breath.   Cardiovascular: Negative.   Gastrointestinal: Negative.   Endocrine: Negative.   Genitourinary: Negative.   Musculoskeletal: Negative.   Skin: Negative.   Allergic/Immunologic: Negative.   Neurological: Negative.   Hematological: Negative.   Psychiatric/Behavioral: Negative.   All other systems reviewed and are negative.     Allergies  Codeine  Home Medications   Prior to Admission medications   Medication Sig Start Date End Date Taking? Authorizing Provider  albuterol (PROVENTIL HFA;VENTOLIN HFA) 108 (90 BASE) MCG/ACT inhaler Inhale 1-2 puffs into the lungs every 6 (six) hours as needed for wheezing. 07/01/12   April K Palumbo-Rasch, MD  amLODipine (NORVASC) 10 MG tablet Take 10 mg by mouth every evening.    Historical Provider, MD  chlorpheniramine-HYDROcodone (TUSSIONEX PENNKINETIC ER) 10-8 MG/5ML LQCR Take 5 mLs by mouth every 12 (twelve) hours as needed for cough. 07/20/13   Shari A Upstill, PA-C  clonazePAM (KLONOPIN) 1 MG tablet Take 1 mg by mouth at bedtime.    Historical Provider, MD  labetalol (NORMODYNE) 300 MG tablet Take 150 mg by mouth 2 (two) times daily.  Historical Provider, MD  traMADol (ULTRAM) 50 MG tablet Take 50 mg by mouth every 6 (six) hours as needed for pain. Take 1-2 tablets by mouth every 6 hours for pain    Historical Provider, MD   BP 142/96  Pulse 101  Temp(Src) 98.1 F (36.7 C) (Oral)  Resp 24  SpO2 93% Physical Exam  Nursing note and vitals reviewed. Constitutional: He is oriented to person, place, and time. He appears well-developed and well-nourished.  HENT:  Head: Normocephalic and atraumatic.  Right Ear: External ear normal.  Left Ear: External ear normal.  Nose: Nose normal.  Mouth/Throat:  Oropharynx is clear and moist.  Eyes: Conjunctivae are normal. Pupils are equal, round, and reactive to light.  Neck: Normal range of motion. Neck supple.  Cardiovascular: Normal rate, regular rhythm, normal heart sounds and intact distal pulses.   Pulmonary/Chest: Effort normal and breath sounds normal.  Abdominal: Soft. Bowel sounds are normal.  Musculoskeletal: He exhibits no edema and no tenderness.  Clotted fistula rue,   Neurological: He is alert and oriented to person, place, and time. He has normal reflexes.  Left side weakness (c.w. Prior stroke)  Skin: Skin is warm and dry.  Psychiatric: He has a normal mood and affect. His behavior is normal. Judgment and thought content normal.    ED Course  Procedures (including critical care time) Labs Review Labs Reviewed  BASIC METABOLIC PANEL - Abnormal; Notable for the following:    Potassium 6.1 (*)    BUN 71 (*)    Creatinine, Ser 10.40 (*)    GFR calc non Af Amer 5 (*)    GFR calc Af Amer 6 (*)    Anion gap 22 (*)    All other components within normal limits  TROPONIN I  CBC WITH DIFFERENTIAL    Imaging Review Dg Chest 2 View  07/27/2013   CLINICAL DATA:  Cough and shortness of breath.  EXAM: CHEST  2 VIEW  COMPARISON:  07/20/2013  FINDINGS: Midline trachea. Moderate cardiomegaly. Similar right hemidiaphragm elevation with pleural parenchymal scarring in the right lung base. No left-sided pleural fluid or pneumothorax. Clear left lung.  IMPRESSION: Stable over multiple prior exams. Cardiomegaly with chronic interstitial thickening. Difficult to exclude mild pulmonary venous congestion.  Pleural parenchymal scarring at the right lung base.   Electronically Signed   By: Abigail Miyamoto M.D.   On: 07/27/2013 12:25     EKG Interpretation   Date/Time:  Monday July 27 2013 12:04:45 EDT Ventricular Rate:  99 PR Interval:  174 QRS Duration: 116 QT Interval:  392 QTC Calculation: 503 R Axis:   2 Text Interpretation:  Normal sinus  rhythm Possible Left atrial enlargement  Nonspecific T wave abnormality Prolonged QT Abnormal ECG No significant  change since last tracing Confirmed by Ethin Drummond MD, Maryjane Benedict ([id number]) on  07/27/2013 2:00:41 PM     Filed Vitals:   07/27/13 1151  BP: 142/96  Pulse: 101  Temp: 98.1 F (36.7 C)  TempSrc: Oral  Resp: 24  SpO2: 93%    MDM   Final diagnoses:  Cough  Dyspnea  Renal failure  medication refill (albuterol)  53 y.o. Dialysis patient complaining of cough.  Oxygenation adequate at 93%.  Lab abnormalities consistent with renal failure.  EKG abnormal but unchanged from prior tracings.  Patient does not have history c.w. Pe, cardiac ischemia, infection, or pneumothorax.  His history and exam are most consistent with mild pulmonary congestion as this is his two day span without  dialysis.  Patient advised of need for follow up and return precautions.     Shaune Pollack, MD 07/27/13 [phone]

## 2013-07-27 NOTE — Discharge Instructions (Signed)
Shortness of Breath °Shortness of breath means you have trouble breathing. Shortness of breath needs medical care right away. °HOME CARE  °· Do not smoke. °· Avoid being around chemicals or things (paint fumes, dust) that may bother your breathing. °· Rest as needed. Slowly begin your normal activities. °· Only take medicines as told by your doctor. °· Keep all doctor visits as told. °GET HELP RIGHT AWAY IF:  °· Your shortness of breath gets worse. °· You feel lightheaded, pass out (faint), or have a cough that is not helped by medicine. °· You cough up blood. °· You have pain with breathing. °· You have pain in your chest, arms, shoulders, or belly (abdomen). °· You have a fever. °· You cannot walk up stairs or exercise the way you normally do. °· You do not get better in the time expected. °· You have a hard time doing normal activities even with rest. °· You have problems with your medicines. °· You have any new symptoms. °MAKE SURE YOU: °· Understand these instructions. °· Will watch your condition. °· Will get help right away if you are not doing well or get worse. °Document Released: 06/13/2007 Document Revised: 12/30/2012 Document Reviewed: 03/12/2011 °ExitCare® Patient Information ©2015 [employer]. This information is not intended to replace advice given to you by your health care provider. Make sure you discuss any questions you have with your health care provider. ° °

## 2013-07-28 ENCOUNTER — Emergency Department (HOSPITAL_BASED_OUTPATIENT_CLINIC_OR_DEPARTMENT_OTHER)
Admission: EM | Admit: 2013-07-28 | Discharge: 2013-07-28 | Disposition: A | Discharge status: Home / Self Care | Payer: Medicare Other | Attending: Emergency Medicine | Admitting: Emergency Medicine

## 2013-07-28 ENCOUNTER — Encounter (HOSPITAL_BASED_OUTPATIENT_CLINIC_OR_DEPARTMENT_OTHER): Payer: Self-pay | Admitting: Emergency Medicine

## 2013-07-28 DIAGNOSIS — N2581 Secondary hyperparathyroidism of renal origin: Secondary | ICD-10-CM | POA: Diagnosis not present

## 2013-07-28 DIAGNOSIS — R05 Cough: Secondary | ICD-10-CM | POA: Insufficient documentation

## 2013-07-28 DIAGNOSIS — E119 Type 2 diabetes mellitus without complications: Secondary | ICD-10-CM | POA: Diagnosis not present

## 2013-07-28 DIAGNOSIS — I120 Hypertensive chronic kidney disease with stage 5 chronic kidney disease or end stage renal disease: Secondary | ICD-10-CM | POA: Diagnosis not present

## 2013-07-28 DIAGNOSIS — N039 Chronic nephritic syndrome with unspecified morphologic changes: Secondary | ICD-10-CM | POA: Diagnosis not present

## 2013-07-28 DIAGNOSIS — N186 End stage renal disease: Secondary | ICD-10-CM | POA: Insufficient documentation

## 2013-07-28 DIAGNOSIS — E1129 Type 2 diabetes mellitus with other diabetic kidney complication: Secondary | ICD-10-CM | POA: Diagnosis not present

## 2013-07-28 DIAGNOSIS — E8779 Other fluid overload: Secondary | ICD-10-CM | POA: Diagnosis not present

## 2013-07-28 DIAGNOSIS — Z992 Dependence on renal dialysis: Secondary | ICD-10-CM | POA: Insufficient documentation

## 2013-07-28 DIAGNOSIS — Z862 Personal history of diseases of the blood and blood-forming organs and certain disorders involving the immune mechanism: Secondary | ICD-10-CM | POA: Insufficient documentation

## 2013-07-28 DIAGNOSIS — Z9889 Other specified postprocedural states: Secondary | ICD-10-CM | POA: Insufficient documentation

## 2013-07-28 DIAGNOSIS — Z79899 Other long term (current) drug therapy: Secondary | ICD-10-CM | POA: Insufficient documentation

## 2013-07-28 DIAGNOSIS — R059 Cough, unspecified: Secondary | ICD-10-CM | POA: Insufficient documentation

## 2013-07-28 DIAGNOSIS — Z8673 Personal history of transient ischemic attack (TIA), and cerebral infarction without residual deficits: Secondary | ICD-10-CM | POA: Insufficient documentation

## 2013-07-28 DIAGNOSIS — Z87891 Personal history of nicotine dependence: Secondary | ICD-10-CM | POA: Diagnosis not present

## 2013-07-28 DIAGNOSIS — D631 Anemia in chronic kidney disease: Secondary | ICD-10-CM | POA: Diagnosis not present

## 2013-07-28 DIAGNOSIS — I10 Essential (primary) hypertension: Secondary | ICD-10-CM | POA: Diagnosis not present

## 2013-07-28 MED ORDER — IPRATROPIUM-ALBUTEROL 0.5-2.5 (3) MG/3ML IN SOLN
3.0000 mL | RESPIRATORY_TRACT | Status: DC
  Filled 2013-07-28: qty 3

## 2013-07-28 MED ORDER — IPRATROPIUM-ALBUTEROL 0.5-2.5 (3) MG/3ML IN SOLN
3.0000 mL | Freq: Once | RESPIRATORY_TRACT | Status: AC
  Administered 2013-07-28: 3 mL via RESPIRATORY_TRACT

## 2013-07-28 MED ORDER — OXYCODONE-ACETAMINOPHEN 5-325 MG PO TABS
1.0000 | ORAL_TABLET | Freq: Once | ORAL | Status: AC
  Administered 2013-07-28: 1 via ORAL
  Filled 2013-07-28: qty 1

## 2013-07-28 MED ORDER — ALBUTEROL SULFATE (2.5 MG/3ML) 0.083% IN NEBU
2.5000 mg | INHALATION_SOLUTION | Freq: Once | RESPIRATORY_TRACT | Status: AC
  Administered 2013-07-28: 2.5 mg via RESPIRATORY_TRACT
  Filled 2013-07-28: qty 3

## 2013-07-28 NOTE — Discharge Instructions (Signed)
Cough, Adult ° A cough is a reflex. It helps you clear your throat and airways. A cough can help heal your body. A cough can last 2 or 3 weeks (acute) or may last more than 8 weeks (chronic). Some common causes of a cough can include an infection, allergy, or a cold. °HOME CARE °· Only take medicine as told by your doctor. °· If given, take your medicines (antibiotics) as told. Finish them even if you start to feel better. °· Use a cold steam vaporizer or humidier in your home. This can help loosen thick spit (secretions). °· Sleep so you are almost sitting up (semi-upright). Use pillows to do this. This helps reduce coughing. °· Rest as needed. °· Stop smoking if you smoke. °GET HELP RIGHT AWAY IF: °· You have yellowish-white fluid (pus) in your thick spit. °· Your cough gets worse. °· Your medicine does not reduce coughing, and you are losing sleep. °· You cough up blood. °· You have trouble breathing. °· Your pain gets worse and medicine does not help. °· You have a fever. °MAKE SURE YOU:  °· Understand these instructions. °· Will watch your condition. °· Will get help right away if you are not doing well or get worse. °Document Released: 09/07/2010 Document Revised: 03/19/2011 Document Reviewed: 09/07/2010 °ExitCare® Patient Information ©2015 [employer]. This information is not intended to replace advice given to you by your health care provider. Make sure you discuss any questions you have with your health care provider. ° °

## 2013-07-28 NOTE — ED Notes (Signed)
Pt states that he has now developed a cough and is unable to stop coughing

## 2013-07-28 NOTE — ED Provider Notes (Signed)
CSN: [id number]     Arrival date & time 07/28/13  0057 History   First MD Initiated Contact with Patient 07/28/13 0118     Chief Complaint  Patient presents with  . Cough     (Consider location/radiation/quality/duration/timing/severity/associated sxs/prior Treatment) HPI This is a 53 year old male with a history of end-stage renal disease on hemodialysis. He was seen here yesterday for cough. Chest x-ray showed only slight central venous congestion. He is due for dialysis this morning at 6 AM. He is here because he wants relief for his cough. His cough is preventing him from lying down comfortably. There is moderate to severe at its worst. He is mildly short of breath and is denying chest pain except for left lower chest wall pain which he attributes to coughing; this is worse with movement or deep breathing. His cough has been nonproductive. He has been using his albuterol inhaler without adequate relief.  Past Medical History  Diagnosis Date  . Hypertension   . Renal insufficiency   . Anemia   . Stroke   . Hemiparesis due to old stroke     left  . LV dysfunction     EF 25-30% by echo 07/2011  . MR (mitral regurgitation)     moderate to severe, echo 07/2011  . Shortness of breath   . Diabetes mellitus without complication    Past Surgical History  Procedure Laterality Date  . Kidney transplant      2011 rejected kidney 2012 back on dialysis  . Av fistula placement    . Cardiac catheterization      [place] without cardioversion  08/17/2011    Procedure: TRANSESOPHAGEAL ECHOCARDIOGRAM (TEE);  Surgeon: Larey Dresser, MD;  Location: [facility] ENDOSCOPY;  Service: Cardiovascular;  Laterality: N/A;   Family History  Problem Relation Age of Onset  . Hypertension Mother    History  Substance Use Topics  . Smoking status: Former Smoker -- 0.50 packs/day    Types: Cigarettes    Quit date: 10/13/2012  . Smokeless tobacco: Not on file  . Alcohol Use: No    Review of  Systems  All other systems reviewed and are negative.   Allergies  Codeine  Home Medications   Prior to Admission medications   Medication Sig Start Date End Date Taking? Authorizing Provider  albuterol (PROVENTIL HFA;VENTOLIN HFA) 108 (90 BASE) MCG/ACT inhaler Inhale 1-2 puffs into the lungs every 6 (six) hours as needed for wheezing. 07/01/12  Yes April K Palumbo-Rasch, MD  amLODipine (NORVASC) 10 MG tablet Take 10 mg by mouth every evening.   Yes Historical Provider, MD  chlorpheniramine-HYDROcodone (TUSSIONEX PENNKINETIC ER) 10-8 MG/5ML LQCR Take 5 mLs by mouth every 12 (twelve) hours as needed for cough. 07/20/13  Yes Shari A Upstill, PA-C  clonazePAM (KLONOPIN) 1 MG tablet Take 1 mg by mouth at bedtime.   Yes Historical Provider, MD  labetalol (NORMODYNE) 300 MG tablet Take 150 mg by mouth 2 (two) times daily.   Yes Historical Provider, MD  traMADol (ULTRAM) 50 MG tablet Take 50 mg by mouth every 6 (six) hours as needed for pain. Take 1-2 tablets by mouth every 6 hours for pain   Yes Historical Provider, MD   BP 148/88  Pulse 104  Temp(Src) 98.8 F (37.1 C) (Oral)  Resp 22  Ht 5\' 9"  (1.753 m)  Wt 180 lb (81.647 kg)  BMI 26.57 kg/m2  SpO2 91%  Physical Exam General: Well-developed, well-nourished male in no acute  distress; appearance consistent with age of record HENT: normocephalic; atraumatic Eyes: pupils equal, round and reactive to light; extraocular muscles intact; left medial pterygium Neck: supple Heart: regular rate and rhythm; distant sounds Lungs: clear to auscultation bilaterally except a few basilar rales Abdomen: soft; nondistended; nontender; no masses or hepatosplenomegaly; bowel sounds present Extremities: No deformity; dialysis fistula left upper arm with pulse and thrill Neurologic: Awake, alert and oriented; left hemiparesis Skin: Warm and dry Psychiatric: Normal mood and affect    ED Course  Procedures (including critical care time)  MDM  2:08  AM Feels better after neb treatment and Percocet. States he is ready to go home. Intends to be at dialysis at 6AM.     Wynetta Fines, MD 07/28/13 [phone]

## 2013-07-29 DIAGNOSIS — N186 End stage renal disease: Secondary | ICD-10-CM | POA: Diagnosis not present

## 2013-07-29 DIAGNOSIS — E8779 Other fluid overload: Secondary | ICD-10-CM | POA: Diagnosis not present

## 2013-07-29 DIAGNOSIS — E1129 Type 2 diabetes mellitus with other diabetic kidney complication: Secondary | ICD-10-CM | POA: Diagnosis not present

## 2013-07-29 DIAGNOSIS — D631 Anemia in chronic kidney disease: Secondary | ICD-10-CM | POA: Diagnosis not present

## 2013-07-29 DIAGNOSIS — N2581 Secondary hyperparathyroidism of renal origin: Secondary | ICD-10-CM | POA: Diagnosis not present

## 2013-07-30 DIAGNOSIS — E1129 Type 2 diabetes mellitus with other diabetic kidney complication: Secondary | ICD-10-CM | POA: Diagnosis not present

## 2013-07-30 DIAGNOSIS — E8779 Other fluid overload: Secondary | ICD-10-CM | POA: Diagnosis not present

## 2013-07-30 DIAGNOSIS — N186 End stage renal disease: Secondary | ICD-10-CM | POA: Diagnosis not present

## 2013-07-30 DIAGNOSIS — N2581 Secondary hyperparathyroidism of renal origin: Secondary | ICD-10-CM | POA: Diagnosis not present

## 2013-07-30 DIAGNOSIS — D631 Anemia in chronic kidney disease: Secondary | ICD-10-CM | POA: Diagnosis not present

## 2013-08-01 DIAGNOSIS — E1129 Type 2 diabetes mellitus with other diabetic kidney complication: Secondary | ICD-10-CM | POA: Diagnosis not present

## 2013-08-01 DIAGNOSIS — N2581 Secondary hyperparathyroidism of renal origin: Secondary | ICD-10-CM | POA: Diagnosis not present

## 2013-08-01 DIAGNOSIS — D631 Anemia in chronic kidney disease: Secondary | ICD-10-CM | POA: Diagnosis not present

## 2013-08-01 DIAGNOSIS — E8779 Other fluid overload: Secondary | ICD-10-CM | POA: Diagnosis not present

## 2013-08-01 DIAGNOSIS — N186 End stage renal disease: Secondary | ICD-10-CM | POA: Diagnosis not present

## 2013-08-04 DIAGNOSIS — N186 End stage renal disease: Secondary | ICD-10-CM | POA: Diagnosis not present

## 2013-08-04 DIAGNOSIS — E8779 Other fluid overload: Secondary | ICD-10-CM | POA: Diagnosis not present

## 2013-08-04 DIAGNOSIS — E1129 Type 2 diabetes mellitus with other diabetic kidney complication: Secondary | ICD-10-CM | POA: Diagnosis not present

## 2013-08-04 DIAGNOSIS — N2581 Secondary hyperparathyroidism of renal origin: Secondary | ICD-10-CM | POA: Diagnosis not present

## 2013-08-04 DIAGNOSIS — D631 Anemia in chronic kidney disease: Secondary | ICD-10-CM | POA: Diagnosis not present

## 2013-08-04 DIAGNOSIS — N039 Chronic nephritic syndrome with unspecified morphologic changes: Secondary | ICD-10-CM | POA: Diagnosis not present

## 2013-08-05 ENCOUNTER — Telehealth: Payer: Self-pay | Admitting: Surgery

## 2013-08-05 NOTE — Telephone Encounter (Signed)
Message copied by Laurena Slimmer on Wed Aug 05, 2013  6:48 PM ------      Message from: Wynetta Fines      Created: Tue Jul 28, 2013  2:10 AM      Regarding: Order for NEAL, FOERST             Patient Name: Nathaniel White, RECLA [id number])      Sex: Male      DOB: 06-21-1960              PCP: MATTINGLY, [place]: [facility]             Types of orders made on 07/28/2013: Consult, Medications, Nursing            Order Date:07/28/2013      Ordering User:MOLPUS, Karen Chafe [[id number]]      Attending Provider:John Michaele Offer, MD [phone]      Authorizing Provider: Wynetta Fines, MD [place]      Department:[facility]-EMERGENCY DEPT [id number]            Order Specific Information      Order: COPD clinic follow up [Custom: [id number]  Order #: [id number]  Qty: 1        Priority: Routine  Class: Hospital Performed          Where does the patient receive follow up COPD care -> Other                 Follow up in -> 5-7 days               Released on: 07/28/2013  2:10 AM                    Priority: Routine  Class: Hospital Performed          Where does the patient receive follow up COPD care -> Other                 Follow up in -> 5-7 days               Released on: 07/28/2013  2:10 AM       ------

## 2013-08-05 NOTE — Telephone Encounter (Signed)
ED CM contacted patient concerning ED f/u COPD appt. Patient reports not having a PCP or Pulmonoligist. Offered to assist patient with finding a PCP. Pt agreeable. ED CM arranged appt at the [facility] 08/14/13 at 915 am. Pt made aware of location, phone number, date, and time  of PCP appointment. Pt informed that he will receive a reminder call prior to appointment. Pt verbalized understanding and appreciation for the assistance. Pt also informed if he is unable to keep this appointment please call to cancel at least 24 hours ahead. Verbalized understanding. Teach back done.

## 2013-08-06 DIAGNOSIS — N186 End stage renal disease: Secondary | ICD-10-CM | POA: Diagnosis not present

## 2013-08-06 DIAGNOSIS — D631 Anemia in chronic kidney disease: Secondary | ICD-10-CM | POA: Diagnosis not present

## 2013-08-06 DIAGNOSIS — N2581 Secondary hyperparathyroidism of renal origin: Secondary | ICD-10-CM | POA: Diagnosis not present

## 2013-08-06 DIAGNOSIS — E1129 Type 2 diabetes mellitus with other diabetic kidney complication: Secondary | ICD-10-CM | POA: Diagnosis not present

## 2013-08-06 DIAGNOSIS — E8779 Other fluid overload: Secondary | ICD-10-CM | POA: Diagnosis not present

## 2013-08-07 DIAGNOSIS — N186 End stage renal disease: Secondary | ICD-10-CM | POA: Diagnosis not present

## 2013-08-08 DIAGNOSIS — N2581 Secondary hyperparathyroidism of renal origin: Secondary | ICD-10-CM | POA: Diagnosis not present

## 2013-08-08 DIAGNOSIS — N186 End stage renal disease: Secondary | ICD-10-CM | POA: Diagnosis not present

## 2013-08-08 DIAGNOSIS — N039 Chronic nephritic syndrome with unspecified morphologic changes: Secondary | ICD-10-CM | POA: Diagnosis not present

## 2013-08-08 DIAGNOSIS — E1129 Type 2 diabetes mellitus with other diabetic kidney complication: Secondary | ICD-10-CM | POA: Diagnosis not present

## 2013-08-08 DIAGNOSIS — D631 Anemia in chronic kidney disease: Secondary | ICD-10-CM | POA: Diagnosis not present

## 2013-08-14 ENCOUNTER — Encounter: Payer: Self-pay | Admitting: Internal Medicine

## 2013-08-14 ENCOUNTER — Ambulatory Visit: Payer: Medicare Other | Attending: Internal Medicine | Admitting: Internal Medicine

## 2013-08-14 VITALS — BP 144/84 | HR 92 | Temp 98.1°F | Resp 18 | Ht 69.0 in | Wt 177.0 lb

## 2013-08-14 DIAGNOSIS — Z992 Dependence on renal dialysis: Secondary | ICD-10-CM

## 2013-08-14 DIAGNOSIS — R059 Cough, unspecified: Secondary | ICD-10-CM | POA: Diagnosis not present

## 2013-08-14 DIAGNOSIS — I5020 Unspecified systolic (congestive) heart failure: Secondary | ICD-10-CM

## 2013-08-14 DIAGNOSIS — R05 Cough: Secondary | ICD-10-CM | POA: Diagnosis not present

## 2013-08-14 DIAGNOSIS — J441 Chronic obstructive pulmonary disease with (acute) exacerbation: Secondary | ICD-10-CM

## 2013-08-14 DIAGNOSIS — N186 End stage renal disease: Secondary | ICD-10-CM | POA: Diagnosis not present

## 2013-08-14 DIAGNOSIS — I5040 Unspecified combined systolic (congestive) and diastolic (congestive) heart failure: Secondary | ICD-10-CM

## 2013-08-14 DIAGNOSIS — I110 Hypertensive heart disease with heart failure: Secondary | ICD-10-CM | POA: Insufficient documentation

## 2013-08-14 DIAGNOSIS — I10 Essential (primary) hypertension: Secondary | ICD-10-CM | POA: Diagnosis not present

## 2013-08-14 DIAGNOSIS — I519 Heart disease, unspecified: Secondary | ICD-10-CM

## 2013-08-14 MED ORDER — BENZONATATE 100 MG PO CAPS: 100.0000 mg | ORAL_CAPSULE | Freq: Three times a day (TID) | ORAL | Status: DC | PRN

## 2013-08-14 NOTE — Progress Notes (Signed)
Patient here for shortness of breath and cough.  He has had this problem for about 8 months.

## 2013-08-14 NOTE — Progress Notes (Signed)
Patient Demographics  Nathaniel White, is a 53 y.o. male  [id number]  [id number]  DOB - 28-Nov-1960  CC:  Chief Complaint  Patient presents with  . Shortness of Breath  . Cough       HPI: Nathaniel White is a 53 y.o. male here today to establish medical care.He has history of CVA, ESRD on dialysis on Tuesday Thursday and Saturdays patient recently went to the emergency room with symptoms of cough, EMR reviewed patient was treated with nebulization and symptomatically improved, he was advised to follow with pulmonologist., And 2013 patient had pleural effusion, also had an echocardiogram done with a low EF, patient denies any orthopnea PND but has leg swelling, denies any chest pain but does have shortness of breath, he is using albuterol when necessary has still cough denies any fever chills. Patient has No headache, No chest pain, No abdominal pain - No Nausea, No new weakness tingling or numbness, No Cough - SOB.  Allergies  Allergen Reactions  . Codeine Shortness Of Breath   Past Medical History  Diagnosis Date  . Hypertension   . Renal insufficiency   . Anemia   . Stroke   . Hemiparesis due to old stroke     left  . LV dysfunction     EF 25-30% by echo 07/2011  . MR (mitral regurgitation)     moderate to severe, echo 07/2011  . Shortness of breath   . Diabetes mellitus without complication    Current Outpatient Prescriptions on File Prior to Visit  Medication Sig Dispense Refill  . albuterol (PROVENTIL HFA;VENTOLIN HFA) 108 (90 BASE) MCG/ACT inhaler Inhale 1-2 puffs into the lungs every 6 (six) hours as needed for wheezing.  1 Inhaler  0  . amLODipine (NORVASC) 10 MG tablet Take 10 mg by mouth every evening.      . chlorpheniramine-HYDROcodone (TUSSIONEX PENNKINETIC ER) 10-8 MG/5ML LQCR Take 5 mLs by mouth every 12 (twelve) hours as needed for cough.  70 mL  0  . clonazePAM (KLONOPIN) 1 MG tablet Take 1 mg by mouth at bedtime.      Marland Kitchen labetalol (NORMODYNE) 300  MG tablet Take 150 mg by mouth 2 (two) times daily.      . traMADol (ULTRAM) 50 MG tablet Take 50 mg by mouth every 6 (six) hours as needed for pain. Take 1-2 tablets by mouth every 6 hours for pain       No current facility-administered medications on file prior to visit.   Family History  Problem Relation Age of Onset  . Hypertension Mother   . Diabetes Paternal Grandmother   . Cancer Paternal Grandfather    History   Social History  . Marital Status: Single    Spouse Name: N/A    Number of Children: N/A  . Years of Education: N/A   Occupational History  . Not on file.   Social History Main Topics  . Smoking status: Former Smoker -- 0.50 packs/day    Types: Cigarettes    Quit date: 10/13/2012  . Smokeless tobacco: Not on file  . Alcohol Use: No  . Drug Use: No  . Sexual Activity: Not on file   Other Topics Concern  . Not on file   Social History Narrative  . No narrative on file    Review of Systems: Constitutional: Negative for fever, chills, diaphoresis, activity change, appetite change and fatigue. HENT: Negative for ear pain, nosebleeds, congestion, facial swelling, rhinorrhea, neck pain, neck stiffness  and ear discharge.  Eyes: Negative for pain, discharge, redness, itching and visual disturbance. Respiratory: Negative for cough, choking, chest tightness, shortness of breath, wheezing and stridor.  Cardiovascular: Negative for chest pain, palpitations and leg swelling. Gastrointestinal: Negative for abdominal distention. Genitourinary: Negative for dysuria, urgency, frequency, hematuria, flank pain, decreased urine volume, difficulty urinating and dyspareunia.  Musculoskeletal: Negative for back pain, joint swelling, arthralgia and gait problem. Neurological: Negative for dizziness, tremors, seizures, syncope, facial asymmetry, speech difficulty, weakness, light-headedness, numbness and headaches.  Hematological: Negative for adenopathy. Does not bruise/bleed  easily. Psychiatric/Behavioral: Negative for hallucinations, behavioral problems, confusion, dysphoric mood, decreased concentration and agitation.    Objective:   Filed Vitals:   08/14/13 0919  BP: 144/84  Pulse: 92  Temp: 98.1 F (36.7 C)  Resp: 18    Physical Exam: Constitutional: Patient appears well-developed and well-nourished. No distress. HENT: Normocephalic, atraumatic, External right and left ear normal. Oropharynx is clear and moist.  Eyes: Conjunctivae and EOM are normal. PERRLA, no scleral icterus. Neck: Normal ROM. Neck supple. No JVD. No tracheal deviation. No thyromegaly. CVS: RRR, S1/S2 +, no murmurs, no gallops, no carotid bruit.  Pulmonary: Effort and breath sounds normal, no stridor, rhonchi, wheezes, rales.  Abdominal: Soft. BS +, no distension, tenderness, rebound or guarding.  Musculoskeletal: Normal range of motion. No edema and no tenderness. Left upper arm AVfistula, lower extremity 1+ edema  Neuro: Alert. Normal reflexes, muscle tone coordination. No cranial nerve deficit. Skin: Skin is warm and dry. No rash noted. Not diaphoretic. No erythema. No pallor. Psychiatric: Normal mood and affect. Behavior, judgment, thought content normal.  Lab Results  Component Value Date   WBC 4.5 07/27/2013   HGB 11.0* 07/27/2013   HCT 33.7* 07/27/2013   MCV 91.3 07/27/2013   PLT 89* 07/27/2013   Lab Results  Component Value Date   CREATININE 10.40* 07/27/2013   BUN 71* 07/27/2013   NA 142 07/27/2013   K 6.1* 07/27/2013   CL 97 07/27/2013   CO2 23 07/27/2013    Lab Results  Component Value Date   HGBA1C 5.4 08/13/2011   Lipid Panel     Component Value Date/Time   CHOL 102 08/14/2011 0400   TRIG 71 08/14/2011 0400   HDL 43 08/14/2011 0400   CHOLHDL 2.4 08/14/2011 0400   VLDL 14 08/14/2011 0400   LDLCALC 45 08/14/2011 0400       Assessment and plan:   1. Essential hypertension Blood pressure is borderline elevated, I have advised patient for DASH diet, also he is on  labetalol and Norvasc  2. ESRD (end stage renal disease) on dialysis Following up with the nephrologist and has dialysis Tuesday Thursday and Saturday scheduled.  3. COPD with exacerbation Continue with albuterol when necessary - Ambulatory referral to Pulmonology  4. Cough  - benzonatate (TESSALON) 100 MG capsule; Take 1 capsule (100 mg total) by mouth 3 (three) times daily as needed for cough.  Dispense: 30 capsule; Refill: 1  5. LV dysfunction  - Ambulatory referral to Cardiology  Return in about 3 months (around 11/14/2013) for hypertension, COPD.     Lorayne Marek, MD

## 2013-08-14 NOTE — Addendum Note (Signed)
Addended by: Dorothe Pea on: 08/14/2013 02:31 PM   Modules accepted: Orders

## 2013-08-14 NOTE — Patient Instructions (Signed)
DASH Eating Plan °DASH stands for "Dietary Approaches to Stop Hypertension." The DASH eating plan is a healthy eating plan that has been shown to reduce high blood pressure (hypertension). Additional health benefits may include reducing the risk of type 2 diabetes mellitus, heart disease, and stroke. The DASH eating plan may also help with weight loss. °WHAT DO I NEED TO KNOW ABOUT THE DASH EATING PLAN? °For the DASH eating plan, you will follow these general guidelines: °· Choose foods with a percent daily value for sodium of less than 5% (as listed on the food label). °· Use salt-free seasonings or herbs instead of table salt or sea salt. °· Check with your health care provider or pharmacist before using salt substitutes. °· Eat lower-sodium products, often labeled as "lower sodium" or "no salt added." °· Eat fresh foods. °· Eat more vegetables, fruits, and low-fat dairy products. °· Choose whole grains. Look for the word "whole" as the first word in the ingredient list. °· Choose fish and skinless chicken or turkey more often than red meat. Limit fish, poultry, and meat to 6 oz (170 g) each day. °· Limit sweets, desserts, sugars, and sugary drinks. °· Choose heart-healthy fats. °· Limit cheese to 1 oz (28 g) per day. °· Eat more home-cooked food and less restaurant, buffet, and fast food. °· Limit fried foods. °· Cook foods using methods other than frying. °· Limit canned vegetables. If you do use them, rinse them well to decrease the sodium. °· When eating at a restaurant, ask that your food be prepared with less salt, or no salt if possible. °WHAT FOODS CAN I EAT? °Seek help from a dietitian for individual calorie needs. °Grains °Whole grain or whole wheat bread. Brown rice. Whole grain or whole wheat pasta. Quinoa, bulgur, and whole grain cereals. Low-sodium cereals. Corn or whole wheat flour tortillas. Whole grain cornbread. Whole grain crackers. Low-sodium crackers. °Vegetables °Fresh or frozen vegetables  (raw, steamed, roasted, or grilled). Low-sodium or reduced-sodium tomato and vegetable juices. Low-sodium or reduced-sodium tomato sauce and paste. Low-sodium or reduced-sodium canned vegetables.  °Fruits °All fresh, canned (in natural juice), or frozen fruits. °Meat and Other Protein Products °Ground beef (85% or leaner), grass-fed beef, or beef trimmed of fat. Skinless chicken or turkey. Ground chicken or turkey. Pork trimmed of fat. All fish and seafood. Eggs. Dried beans, peas, or lentils. Unsalted nuts and seeds. Unsalted canned beans. °Dairy °Low-fat dairy products, such as skim or 1% milk, 2% or reduced-fat cheeses, low-fat ricotta or cottage cheese, or plain low-fat yogurt. Low-sodium or reduced-sodium cheeses. °Fats and Oils °Tub margarines without trans fats. Light or reduced-fat mayonnaise and salad dressings (reduced sodium). Avocado. Safflower, olive, or canola oils. Natural peanut or almond butter. °Other °Unsalted popcorn and pretzels. °The items listed above may not be a complete list of recommended foods or beverages. Contact your dietitian for more options. °WHAT FOODS ARE NOT RECOMMENDED? °Grains °White bread. White pasta. White rice. Refined cornbread. Bagels and croissants. Crackers that contain trans fat. °Vegetables °Creamed or fried vegetables. Vegetables in a cheese sauce. Regular canned vegetables. Regular canned tomato sauce and paste. Regular tomato and vegetable juices. °Fruits °Dried fruits. Canned fruit in light or heavy syrup. Fruit juice. °Meat and Other Protein Products °Fatty cuts of meat. Ribs, chicken wings, bacon, sausage, bologna, salami, chitterlings, fatback, hot dogs, bratwurst, and packaged luncheon meats. Salted nuts and seeds. Canned beans with salt. °Dairy °Whole or 2% milk, cream, half-and-half, and cream cheese. Whole-fat or sweetened yogurt. Full-fat   cheeses or blue cheese. Nondairy creamers and whipped toppings. Processed cheese, cheese spreads, or cheese  curds. °Condiments °Onion and garlic salt, seasoned salt, table salt, and sea salt. Canned and packaged gravies. Worcestershire sauce. Tartar sauce. Barbecue sauce. Teriyaki sauce. Soy sauce, including reduced sodium. Steak sauce. Fish sauce. Oyster sauce. Cocktail sauce. Horseradish. Ketchup and mustard. Meat flavorings and tenderizers. Bouillon cubes. Hot sauce. Tabasco sauce. Marinades. Taco seasonings. Relishes. °Fats and Oils °Butter, stick margarine, lard, shortening, ghee, and bacon fat. Coconut, palm kernel, or palm oils. Regular salad dressings. °Other °Pickles and olives. Salted popcorn and pretzels. °The items listed above may not be a complete list of foods and beverages to avoid. Contact your dietitian for more information. °WHERE CAN I FIND MORE INFORMATION? °[employer]: www.nhlbi.nih.gov/health/health-topics/topics/dash/ °Document Released: 12/14/2010 Document Revised: 05/11/2013 Document Reviewed: 10/29/2012 °[employer]® Patient Information ©2015 [employer]. This information is not intended to replace advice given to you by your health care provider. Make sure you discuss any questions you have with your health care provider. ° °

## 2013-08-25 ENCOUNTER — Other Ambulatory Visit: Payer: Self-pay

## 2013-08-25 ENCOUNTER — Encounter: Payer: Self-pay | Admitting: Vascular Surgery

## 2013-08-25 ENCOUNTER — Ambulatory Visit (INDEPENDENT_AMBULATORY_CARE_PROVIDER_SITE_OTHER): Payer: Medicare Other | Admitting: Vascular Surgery

## 2013-08-25 ENCOUNTER — Telehealth: Payer: Self-pay | Admitting: Internal Medicine

## 2013-08-25 VITALS — BP 145/86 | HR 97 | Ht 69.0 in | Wt 180.5 lb

## 2013-08-25 DIAGNOSIS — T82898A Other specified complication of vascular prosthetic devices, implants and grafts, initial encounter: Secondary | ICD-10-CM | POA: Diagnosis not present

## 2013-08-25 DIAGNOSIS — N186 End stage renal disease: Secondary | ICD-10-CM | POA: Diagnosis not present

## 2013-08-25 NOTE — Progress Notes (Signed)
Patient has today for evaluation of left upper arm AV fistula. We were called by the dialysis unit today performing [place] if he was having bleeding from his upper arm fistula an ulcerated area. He does have a prior history of stenosis centrally undergoing angioplasty of his fistula. He had a prior superficial ulceration healed.  Past Medical History  Diagnosis Date  . Hypertension   . Renal insufficiency   . Anemia   . Stroke   . Hemiparesis due to old stroke     left  . LV dysfunction     EF 25-30% by echo 07/2011  . MR (mitral regurgitation)     moderate to severe, echo 07/2011  . Shortness of breath   . Diabetes mellitus without complication     History  Substance Use Topics  . Smoking status: Former Smoker -- 0.50 packs/day    Types: Cigarettes    Quit date: 10/13/2012  . Smokeless tobacco: Not on file  . Alcohol Use: No    Family History  Problem Relation Age of Onset  . Hypertension Mother   . Varicose Veins Mother   . Diabetes Paternal Grandmother   . Cancer Paternal Grandfather     Allergies  Allergen Reactions  . Codeine Shortness Of Breath    Current outpatient prescriptions:albuterol (PROVENTIL HFA;VENTOLIN HFA) 108 (90 BASE) MCG/ACT inhaler, Inhale 1-2 puffs into the lungs every 6 (six) hours as needed for wheezing., Disp: 1 Inhaler, Rfl: 0;  amLODipine (NORVASC) 10 MG tablet, Take 10 mg by mouth every evening., Disp: , Rfl: ;  benzonatate (TESSALON) 100 MG capsule, Take 1 capsule (100 mg total) by mouth 3 (three) times daily as needed for cough., Disp: 30 capsule, Rfl: 1 chlorpheniramine-HYDROcodone (TUSSIONEX PENNKINETIC ER) 10-8 MG/5ML LQCR, Take 5 mLs by mouth every 12 (twelve) hours as needed for cough., Disp: 70 mL, Rfl: 0;  clonazePAM (KLONOPIN) 1 MG tablet, Take 1 mg by mouth at bedtime., Disp: , Rfl: ;  labetalol (NORMODYNE) 300 MG tablet, Take 150 mg by mouth 2 (two) times daily., Disp: , Rfl:  traMADol (ULTRAM) 50 MG tablet, Take 50 mg by mouth every 6  (six) hours as needed for pain. Take 1-2 tablets by mouth every 6 hours for pain, Disp: , Rfl:   BP 145/86  Pulse 97  Ht 5\' 9"  (1.753 m)  Wt 180 lb 8 oz (81.874 kg)  BMI 26.64 kg/m2  SpO2 100%  Body mass index is 26.64 kg/(m^2).       On physical exam he does have aneurysmal degeneration of his upper arm fistula. He does have an area above the antecubital space in the lower area of current accessed with a the proximally 27mm eschar does appear to go through the entire thickness of the skin. This is the area where he had a bleed.  Ulceration with bleeding from upper arm fistula. The significance of this to the patient and recommended repair. He is not currently bleeding. We have wrapped this with an Ace wrap and he knows what to do if he does have any further bleeding tonight. Plan on correction of this tomorrow in the operating room with resection of the involved skin closure of his vein.

## 2013-08-25 NOTE — Telephone Encounter (Signed)
Patient needs refill for cough pills Please f/u with Patient

## 2013-08-26 ENCOUNTER — Ambulatory Visit (HOSPITAL_COMMUNITY): Payer: Medicare Other | Admitting: Certified Registered"

## 2013-08-26 ENCOUNTER — Encounter (HOSPITAL_COMMUNITY): Payer: Self-pay | Admitting: Certified Registered"

## 2013-08-26 ENCOUNTER — Encounter (HOSPITAL_COMMUNITY): Payer: Medicare Other | Admitting: Certified Registered"

## 2013-08-26 ENCOUNTER — Encounter (HOSPITAL_COMMUNITY)
Admission: RE | Disposition: A | Discharge status: Home / Self Care | Payer: Self-pay | Source: Ambulatory Visit | Attending: Vascular Surgery

## 2013-08-26 ENCOUNTER — Ambulatory Visit (HOSPITAL_COMMUNITY)
Admission: RE | Admit: 2013-08-26 | Discharge: 2013-08-26 | Disposition: A | Discharge status: Home / Self Care | Payer: Medicare Other | Source: Ambulatory Visit | Attending: Vascular Surgery | Admitting: Vascular Surgery

## 2013-08-26 DIAGNOSIS — I69959 Hemiplegia and hemiparesis following unspecified cerebrovascular disease affecting unspecified side: Secondary | ICD-10-CM | POA: Insufficient documentation

## 2013-08-26 DIAGNOSIS — Z885 Allergy status to narcotic agent status: Secondary | ICD-10-CM | POA: Diagnosis not present

## 2013-08-26 DIAGNOSIS — E119 Type 2 diabetes mellitus without complications: Secondary | ICD-10-CM | POA: Diagnosis not present

## 2013-08-26 DIAGNOSIS — J449 Chronic obstructive pulmonary disease, unspecified: Secondary | ICD-10-CM | POA: Insufficient documentation

## 2013-08-26 DIAGNOSIS — I120 Hypertensive chronic kidney disease with stage 5 chronic kidney disease or end stage renal disease: Secondary | ICD-10-CM | POA: Insufficient documentation

## 2013-08-26 DIAGNOSIS — Y832 Surgical operation with anastomosis, bypass or graft as the cause of abnormal reaction of the patient, or of later complication, without mention of misadventure at the time of the procedure: Secondary | ICD-10-CM | POA: Insufficient documentation

## 2013-08-26 DIAGNOSIS — N186 End stage renal disease: Secondary | ICD-10-CM | POA: Diagnosis not present

## 2013-08-26 DIAGNOSIS — T82898A Other specified complication of vascular prosthetic devices, implants and grafts, initial encounter: Secondary | ICD-10-CM | POA: Insufficient documentation

## 2013-08-26 DIAGNOSIS — L97909 Non-pressure chronic ulcer of unspecified part of unspecified lower leg with unspecified severity: Secondary | ICD-10-CM

## 2013-08-26 DIAGNOSIS — D649 Anemia, unspecified: Secondary | ICD-10-CM | POA: Diagnosis not present

## 2013-08-26 DIAGNOSIS — Z79899 Other long term (current) drug therapy: Secondary | ICD-10-CM | POA: Diagnosis not present

## 2013-08-26 DIAGNOSIS — J4489 Other specified chronic obstructive pulmonary disease: Secondary | ICD-10-CM | POA: Insufficient documentation

## 2013-08-26 DIAGNOSIS — I83009 Varicose veins of unspecified lower extremity with ulcer of unspecified site: Secondary | ICD-10-CM

## 2013-08-26 DIAGNOSIS — Z992 Dependence on renal dialysis: Secondary | ICD-10-CM | POA: Diagnosis not present

## 2013-08-26 DIAGNOSIS — I729 Aneurysm of unspecified site: Secondary | ICD-10-CM | POA: Diagnosis not present

## 2013-08-26 DIAGNOSIS — Z87891 Personal history of nicotine dependence: Secondary | ICD-10-CM | POA: Diagnosis not present

## 2013-08-26 DIAGNOSIS — R0602 Shortness of breath: Secondary | ICD-10-CM | POA: Diagnosis not present

## 2013-08-26 HISTORY — PX: REVISON OF ARTERIOVENOUS FISTULA: SHX6074

## 2013-08-26 LAB — POCT I-STAT 4, (NA,K, GLUC, HGB,HCT)
GLUCOSE: 92 mg/dL (ref 70–99)
HCT: 34 % — ABNORMAL LOW (ref 39.0–52.0)
Hemoglobin: 11.6 g/dL — ABNORMAL LOW (ref 13.0–17.0)
POTASSIUM: 4.8 meq/L (ref 3.7–5.3)
Sodium: 140 mEq/L (ref 137–147)

## 2013-08-26 LAB — GLUCOSE, CAPILLARY
GLUCOSE-CAPILLARY: 92 mg/dL (ref 70–99)
Glucose-Capillary: 82 mg/dL (ref 70–99)

## 2013-08-26 SURGERY — Surgical Case
Anesthesia: *Unknown

## 2013-08-26 SURGERY — REVISON OF ARTERIOVENOUS FISTULA
Anesthesia: Monitor Anesthesia Care | Site: Arm Upper | Laterality: Left

## 2013-08-26 MED ORDER — MIDAZOLAM HCL 2 MG/2ML IJ SOLN
INTRAMUSCULAR | Status: AC
  Filled 2013-08-26: qty 2

## 2013-08-26 MED ORDER — SODIUM CHLORIDE 0.9 % IV SOLN: INTRAVENOUS | Status: DC

## 2013-08-26 MED ORDER — THROMBIN 20000 UNITS EX SOLR
CUTANEOUS | Status: AC
  Filled 2013-08-26: qty 20000

## 2013-08-26 MED ORDER — FENTANYL CITRATE 0.05 MG/ML IJ SOLN: 25.0000 ug | INTRAMUSCULAR | Status: DC | PRN

## 2013-08-26 MED ORDER — SODIUM CHLORIDE 0.9 % IR SOLN
Status: DC | PRN
Start: 2013-08-26 — End: 2013-08-26
  Administered 2013-08-26: 1000 mL

## 2013-08-26 MED ORDER — PROPOFOL INFUSION 10 MG/ML OPTIME
INTRAVENOUS | Status: DC | PRN
  Administered 2013-08-26: 100 ug/kg/min via INTRAVENOUS

## 2013-08-26 MED ORDER — CHLORHEXIDINE GLUCONATE CLOTH 2 % EX PADS: 6.0000 | MEDICATED_PAD | Freq: Once | CUTANEOUS | Status: DC

## 2013-08-26 MED ORDER — OXYCODONE HCL 5 MG/5ML PO SOLN: 5.0000 mg | Freq: Once | ORAL | Status: DC | PRN

## 2013-08-26 MED ORDER — PROPOFOL 10 MG/ML IV BOLUS
INTRAVENOUS | Status: AC
  Filled 2013-08-26: qty 20

## 2013-08-26 MED ORDER — LIDOCAINE-EPINEPHRINE 0.5 %-1:200000 IJ SOLN
INTRAMUSCULAR | Status: AC
  Filled 2013-08-26: qty 1

## 2013-08-26 MED ORDER — CHLORHEXIDINE GLUCONATE CLOTH 2 % EX PADS
6.0000 | MEDICATED_PAD | Freq: Once | CUTANEOUS | Status: DC
Start: 2013-08-26 — End: 2013-08-26

## 2013-08-26 MED ORDER — PROMETHAZINE HCL 25 MG/ML IJ SOLN: 6.2500 mg | INTRAMUSCULAR | Status: DC | PRN

## 2013-08-26 MED ORDER — FENTANYL CITRATE 0.05 MG/ML IJ SOLN
INTRAMUSCULAR | Status: DC | PRN
  Administered 2013-08-26 (×3): 50 ug via INTRAVENOUS

## 2013-08-26 MED ORDER — OXYCODONE HCL 5 MG PO TABS: 5.0000 mg | ORAL_TABLET | Freq: Once | ORAL | Status: DC | PRN

## 2013-08-26 MED ORDER — LIDOCAINE-EPINEPHRINE 0.5 %-1:200000 IJ SOLN
INTRAMUSCULAR | Status: DC | PRN
  Administered 2013-08-26: 6 mL via INTRADERMAL

## 2013-08-26 MED ORDER — TRAMADOL HCL 50 MG PO TABS: 50.0000 mg | ORAL_TABLET | Freq: Four times a day (QID) | ORAL | Status: DC | PRN

## 2013-08-26 MED ORDER — PROPOFOL 10 MG/ML IV EMUL
INTRAVENOUS | Status: AC
  Filled 2013-08-26: qty 50

## 2013-08-26 MED ORDER — DEXTROSE 5 % IV SOLN
1.5000 g | INTRAVENOUS | Status: AC
  Administered 2013-08-26: 1.5 g via INTRAVENOUS
  Filled 2013-08-26: qty 1.5

## 2013-08-26 MED ORDER — FENTANYL CITRATE 0.05 MG/ML IJ SOLN
INTRAMUSCULAR | Status: AC
  Filled 2013-08-26: qty 5

## 2013-08-26 MED ORDER — LIDOCAINE HCL (CARDIAC) 20 MG/ML IV SOLN
INTRAVENOUS | Status: AC
  Filled 2013-08-26: qty 5

## 2013-08-26 MED ORDER — MIDAZOLAM HCL 5 MG/5ML IJ SOLN
INTRAMUSCULAR | Status: DC | PRN
  Administered 2013-08-26 (×2): 1 mg via INTRAVENOUS

## 2013-08-26 MED ORDER — SODIUM CHLORIDE 0.9 % IV SOLN
INTRAVENOUS | Status: DC
  Administered 2013-08-26: 13:00:00 via INTRAVENOUS

## 2013-08-26 SURGICAL SUPPLY — 38 items
BANDAGE ESMARK 6X9 LF (GAUZE/BANDAGES/DRESSINGS) ×1 IMPLANT
BENZOIN TINCTURE PRP APPL 2/3 (GAUZE/BANDAGES/DRESSINGS) ×2 IMPLANT
BLADE 10 SAFETY STRL DISP (BLADE) IMPLANT
BNDG ESMARK 6X9 LF (GAUZE/BANDAGES/DRESSINGS) ×2
CANISTER SUCTION 2500CC (MISCELLANEOUS) ×2 IMPLANT
CLIP LIGATING EXTRA MED SLVR (CLIP) ×2 IMPLANT
CLIP LIGATING EXTRA SM BLUE (MISCELLANEOUS) ×2 IMPLANT
COVER PROBE W GEL 5X96 (DRAPES) IMPLANT
COVER SURGICAL LIGHT HANDLE (MISCELLANEOUS) ×2 IMPLANT
CUFF TOURNIQUET SINGLE 18IN (TOURNIQUET CUFF) ×2 IMPLANT
DECANTER SPIKE VIAL GLASS SM (MISCELLANEOUS) ×2 IMPLANT
ELECT REM PT RETURN 9FT ADLT (ELECTROSURGICAL) ×2
ELECTRODE REM PT RTRN 9FT ADLT (ELECTROSURGICAL) ×1 IMPLANT
GAUZE SPONGE 4X4 12PLY STRL (GAUZE/BANDAGES/DRESSINGS) IMPLANT
GEL ULTRASOUND 20GR AQUASONIC (MISCELLANEOUS) IMPLANT
GLOVE BIO SURGEON STRL SZ 6.5 (GLOVE) ×2 IMPLANT
GLOVE SS BIOGEL STRL SZ 7.5 (GLOVE) ×1 IMPLANT
GLOVE SUPERSENSE BIOGEL SZ 7.5 (GLOVE) ×1
GLOVE SURG SS PI 6.0 STRL IVOR (GLOVE) ×2 IMPLANT
GLOVE SURG SS PI 6.5 STRL IVOR (GLOVE) ×4 IMPLANT
GLOVE SURG SS PI 7.0 STRL IVOR (GLOVE) ×2 IMPLANT
GOWN STRL REUS W/ TWL LRG LVL3 (GOWN DISPOSABLE) ×4 IMPLANT
GOWN STRL REUS W/TWL LRG LVL3 (GOWN DISPOSABLE) ×4
KIT BASIN OR (CUSTOM PROCEDURE TRAY) ×2 IMPLANT
KIT ROOM TURNOVER OR (KITS) ×2 IMPLANT
NS IRRIG 1000ML POUR BTL (IV SOLUTION) ×2 IMPLANT
PACK CV ACCESS (CUSTOM PROCEDURE TRAY) ×2 IMPLANT
PAD ARMBOARD 7.5X6 YLW CONV (MISCELLANEOUS) ×4 IMPLANT
SPONGE GAUZE 4X4 12PLY STER LF (GAUZE/BANDAGES/DRESSINGS) ×2 IMPLANT
STRIP CLOSURE SKIN 1/2X4 (GAUZE/BANDAGES/DRESSINGS) ×2 IMPLANT
SUT PROLENE 6 0 CC (SUTURE) ×2 IMPLANT
SUT VIC AB 3-0 SH 27 (SUTURE) ×1
SUT VIC AB 3-0 SH 27X BRD (SUTURE) ×1 IMPLANT
TAPE CLOTH SURG 4X10 WHT LF (GAUZE/BANDAGES/DRESSINGS) ×2 IMPLANT
TOWEL OR 17X24 6PK STRL BLUE (TOWEL DISPOSABLE) ×2 IMPLANT
TOWEL OR 17X26 10 PK STRL BLUE (TOWEL DISPOSABLE) ×2 IMPLANT
UNDERPAD 30X30 INCONTINENT (UNDERPADS AND DIAPERS) ×2 IMPLANT
WATER STERILE IRR 1000ML POUR (IV SOLUTION) ×2 IMPLANT

## 2013-08-26 NOTE — Interval H&P Note (Signed)
History and Physical Interval Note:  08/26/2013 2:25 PM  Nathaniel White  has presented today for surgery, with the diagnosis of Aneurysm of unspecified site   The various methods of treatment have been discussed with the patient and family. After consideration of risks, benefits and other options for treatment, the patient has consented to  Procedure(s): REVISON OF VENOUS ANEURYSM OF ARTERIOVENOUS FISTULA (Left) as a surgical intervention .  The patient's history has been reviewed, patient examined, no change in status, stable for surgery.  I have reviewed the patient's chart and labs.  Questions were answered to the patient's satisfaction.     Symeon Puleo

## 2013-08-26 NOTE — Op Note (Signed)
    OPERATIVE REPORT  DATE OF SURGERY: 08/26/2013  PATIENT: Nathaniel White, 53 y.o. male MRN: [id number]  DOB: [birth year]  PRE-OPERATIVE DIAGNOSIS: Erosion of skin over left upper arm AV fistula  POST-OPERATIVE DIAGNOSIS:  Same  PROCEDURE: Excision of eroded skin and exploration of the main left upper arm AV fistula  SURGEON:  Curt Jews, M.D.  PHYSICIAN ASSISTANT: Trinh  ANESTHESIA:  Local with sedation  EBL: Minimal ml  Total I/O In: 500 [I.V.:500] Out: -   BLOOD ADMINISTERED: None  DRAINS: None  SPECIMEN: None  COUNTS CORRECT:  YES  PLAN OF CARE: PACU   PATIENT DISPOSITION:  PACU - hemodynamically stable  PROCEDURE DETAILS: The patient presented to the opposite 1 day prior to this procedure with bleeding from a left upper arm AV fistula and an eschar present. It is indeterminate if this eroded into the vein and the patient expiration today and repair as needed. He was taken to the operating room placed supine position where the area of the left arm and hand were prepped and draped in sterile fashion. A pneumatic tourniquet was placed just above the antecubital space. The arm was elevated and exsanguinated and the pneumatic tourniquet was inflated. The eschar was removed and this did go down to the vein. The vein was not open. Using local anesthesia an ellipse of skin was removed for the nonviable area of ulceration down to the vein. The skin was mobilized over the cephalic vein fistula. The vein itself was intact. All nonviable skin was removed. The wound was irrigated with saline and hemostasis tablet cautery. The wound is closed with 3-0 nylon sutures in a mattress fashion. Sterile dressing was applied and the patient was taken to the recovery room in stable condition   Curt Jews, M.D. 08/26/2013 3:31 PM

## 2013-08-26 NOTE — H&P (View-Only) (Signed)
Patient has today for evaluation of left upper arm AV fistula. We were called by the dialysis unit today performing [place] if he was having bleeding from his upper arm fistula an ulcerated area. He does have a prior history of stenosis centrally undergoing angioplasty of his fistula. He had a prior superficial ulceration healed.  Past Medical History  Diagnosis Date  . Hypertension   . Renal insufficiency   . Anemia   . Stroke   . Hemiparesis due to old stroke     left  . LV dysfunction     EF 25-30% by echo 07/2011  . MR (mitral regurgitation)     moderate to severe, echo 07/2011  . Shortness of breath   . Diabetes mellitus without complication     History  Substance Use Topics  . Smoking status: Former Smoker -- 0.50 packs/day    Types: Cigarettes    Quit date: 10/13/2012  . Smokeless tobacco: Not on file  . Alcohol Use: No    Family History  Problem Relation Age of Onset  . Hypertension Mother   . Varicose Veins Mother   . Diabetes Paternal Grandmother   . Cancer Paternal Grandfather     Allergies  Allergen Reactions  . Codeine Shortness Of Breath    Current outpatient prescriptions:albuterol (PROVENTIL HFA;VENTOLIN HFA) 108 (90 BASE) MCG/ACT inhaler, Inhale 1-2 puffs into the lungs every 6 (six) hours as needed for wheezing., Disp: 1 Inhaler, Rfl: 0;  amLODipine (NORVASC) 10 MG tablet, Take 10 mg by mouth every evening., Disp: , Rfl: ;  benzonatate (TESSALON) 100 MG capsule, Take 1 capsule (100 mg total) by mouth 3 (three) times daily as needed for cough., Disp: 30 capsule, Rfl: 1 chlorpheniramine-HYDROcodone (TUSSIONEX PENNKINETIC ER) 10-8 MG/5ML LQCR, Take 5 mLs by mouth every 12 (twelve) hours as needed for cough., Disp: 70 mL, Rfl: 0;  clonazePAM (KLONOPIN) 1 MG tablet, Take 1 mg by mouth at bedtime., Disp: , Rfl: ;  labetalol (NORMODYNE) 300 MG tablet, Take 150 mg by mouth 2 (two) times daily., Disp: , Rfl:  traMADol (ULTRAM) 50 MG tablet, Take 50 mg by mouth every 6  (six) hours as needed for pain. Take 1-2 tablets by mouth every 6 hours for pain, Disp: , Rfl:   BP 145/86  Pulse 97  Ht 5\' 9"  (1.753 m)  Wt 180 lb 8 oz (81.874 kg)  BMI 26.64 kg/m2  SpO2 100%  Body mass index is 26.64 kg/(m^2).       On physical exam he does have aneurysmal degeneration of his upper arm fistula. He does have an area above the antecubital space in the lower area of current accessed with a the proximally 59mm eschar does appear to go through the entire thickness of the skin. This is the area where he had a bleed.  Ulceration with bleeding from upper arm fistula. The significance of this to the patient and recommended repair. He is not currently bleeding. We have wrapped this with an Ace wrap and he knows what to do if he does have any further bleeding tonight. Plan on correction of this tomorrow in the operating room with resection of the involved skin closure of his vein.

## 2013-08-26 NOTE — Discharge Instructions (Signed)

## 2013-08-26 NOTE — Transfer of Care (Signed)
Immediate Anesthesia Transfer of Care Note  Patient: Nathaniel White  Procedure(s) Performed: Procedure(s): REVISON OF VENOUS ANEURYSM OF ARTERIOVENOUS FISTULA (Left)  Patient Location: PACU  Anesthesia Type:MAC  Level of Consciousness: awake, oriented and sedated  Airway & Oxygen Therapy: Patient Spontanous Breathing and Patient connected to nasal cannula oxygen  Post-op Assessment: Report given to PACU RN, Post -op Vital signs reviewed and stable and Patient moving all extremities  Post vital signs: Reviewed and stable  Complications: No apparent anesthesia complications

## 2013-08-26 NOTE — Anesthesia Preprocedure Evaluation (Addendum)
Anesthesia Evaluation  Patient identified by MRN, date of birth, ID band Patient awake    Reviewed: Allergy & Precautions, H&P , Patient's Chart, lab work & pertinent test results  Airway Mallampati: II  Neck ROM: Full    Dental   Pulmonary COPDformer smoker,   Decreased BS R side  + decreased breath sounds  rales    Cardiovascular hypertension, Rhythm:Regular Rate:Normal     Neuro/Psych CVA    GI/Hepatic   Endo/Other  diabetes  Renal/GU ESRF and DialysisRenal disease     Musculoskeletal   Abdominal   Peds  Hematology  (+) anemia ,   Anesthesia Other Findings   Reproductive/Obstetrics                         Anesthesia Physical Anesthesia Plan  ASA: IV  Anesthesia Plan: MAC   Post-op Pain Management:    Induction: Intravenous  Airway Management Planned: Natural Airway  Additional Equipment:   Intra-op Plan:   Post-operative Plan:   Informed Consent: I have reviewed the patients History and Physical, chart, labs and discussed the procedure including the risks, benefits and alternatives for the proposed anesthesia with the patient or authorized representative who has indicated his/her understanding and acceptance.     Plan Discussed with:   Anesthesia Plan Comments:        Anesthesia Quick Evaluation

## 2013-08-26 NOTE — Anesthesia Procedure Notes (Signed)
Procedure Name: MAC Date/Time: 08/26/2013 2:51 PM Performed by: Scheryl Darter Pre-anesthesia Checklist: Patient identified, Emergency Drugs available, Suction available, Patient being monitored and Timeout performed Patient Re-evaluated:Patient Re-evaluated prior to inductionOxygen Delivery Method: Simple face mask Preoxygenation: Pre-oxygenation with 100% oxygen Intubation Type: IV induction Placement Confirmation: positive ETCO2

## 2013-08-26 NOTE — Anesthesia Postprocedure Evaluation (Signed)
  Anesthesia Post-op Note  Patient: Nathaniel White  Procedure(s) Performed: Procedure(s): EXCISION OF ERODED SKIN AND EXPLORATION OF MAIN LEFT UPPER ARM AV FISTULA (Left)  Patient Location: PACU  Anesthesia Type:MAC  Level of Consciousness: awake and alert   Airway and Oxygen Therapy: Patient Spontanous Breathing  Post-op Pain: none  Post-op Assessment: Post-op Vital signs reviewed  Post-op Vital Signs: stable  Last Vitals:  Filed Vitals:   08/26/13 1631  BP: 132/82  Pulse: 82  Temp:   Resp:     Complications: No apparent anesthesia complications

## 2013-08-27 ENCOUNTER — Encounter (HOSPITAL_COMMUNITY): Payer: Self-pay | Admitting: Vascular Surgery

## 2013-09-04 ENCOUNTER — Encounter: Payer: Self-pay | Admitting: Gastroenterology

## 2013-09-07 ENCOUNTER — Encounter: Payer: Self-pay | Admitting: Pulmonary Disease

## 2013-09-07 ENCOUNTER — Ambulatory Visit (INDEPENDENT_AMBULATORY_CARE_PROVIDER_SITE_OTHER): Payer: Medicare Other | Admitting: Pulmonary Disease

## 2013-09-07 VITALS — BP 138/78 | HR 96 | Ht 68.0 in | Wt 190.0 lb

## 2013-09-07 DIAGNOSIS — N186 End stage renal disease: Secondary | ICD-10-CM

## 2013-09-07 DIAGNOSIS — Z992 Dependence on renal dialysis: Secondary | ICD-10-CM

## 2013-09-07 DIAGNOSIS — I509 Heart failure, unspecified: Secondary | ICD-10-CM | POA: Diagnosis not present

## 2013-09-07 DIAGNOSIS — R05 Cough: Secondary | ICD-10-CM | POA: Insufficient documentation

## 2013-09-07 DIAGNOSIS — R0602 Shortness of breath: Secondary | ICD-10-CM | POA: Diagnosis not present

## 2013-09-07 DIAGNOSIS — I110 Hypertensive heart disease with heart failure: Secondary | ICD-10-CM

## 2013-09-07 DIAGNOSIS — I5040 Unspecified combined systolic (congestive) and diastolic (congestive) heart failure: Secondary | ICD-10-CM

## 2013-09-07 DIAGNOSIS — R011 Cardiac murmur, unspecified: Secondary | ICD-10-CM

## 2013-09-07 DIAGNOSIS — R059 Cough, unspecified: Secondary | ICD-10-CM | POA: Insufficient documentation

## 2013-09-07 MED ORDER — FLUTICASONE-SALMETEROL 250-50 MCG/DOSE IN AEPB: 1.0000 | INHALATION_SPRAY | Freq: Two times a day (BID) | RESPIRATORY_TRACT | Status: DC

## 2013-09-07 MED ORDER — BENZONATATE 100 MG PO CAPS: 100.0000 mg | ORAL_CAPSULE | Freq: Four times a day (QID) | ORAL | Status: DC | PRN

## 2013-09-07 NOTE — Assessment & Plan Note (Signed)
>>  ASSESSMENT AND PLAN FOR CHF, SYSTOLIC DYSFUNCTION WRITTEN ON 09/07/2013  8:43 PM BY MCQUAID, DOUGLAS B, MD  Currently volume overloaded, referral to cardiology  He probably also has pulmonary hypertension to some degree, may need to consider right heart catheterization at some point.

## 2013-09-07 NOTE — Assessment & Plan Note (Signed)
He has a history of a complicated loculated effusion treated by VATS in 2013, but PFTs at that time did not show evidence of COPD or asthma as he had no airflow obstruction.  There was some evidence of restriction which could have been related to the pleural effusion.  At this point I think that there is a high index of suspicion for COPD, so I will start Advair and repeat PFTs to see if there is obstruction now that the effusion has been managed.  We also need to consider the restriction from pleural scarring and he may need to have another CT scan of his chest to better evaluate this.  Finally, he is volume overloaded today and so his CHF is also contributing to his dyspnea.  Plan: -full PFT -trial Advair -referral to cardiology for CHF -continue volume removal with HD down to dry weight (about 170lbs per patient, he is currently 180lbs)

## 2013-09-07 NOTE — Assessment & Plan Note (Addendum)
Currently volume overloaded, referral to cardiology  He probably also has pulmonary hypertension to some degree, may need to consider right heart catheterization at some point.

## 2013-09-07 NOTE — Patient Instructions (Signed)
Take the tessalon perles as needed for cough Take the advair twice a day no matter how you feel We will refer you to cardiology for your congestive heart failure We will obtain lung function testing to better evaluate your shortness of breath You may need another CT scan, we will see how the pulmonary function tests look We will see you back in 3-4 weeks or sooner if needed

## 2013-09-07 NOTE — Progress Notes (Signed)
Subjective:    Patient ID: Nathaniel White, male    DOB: 02-Jan-1961, 53 y.o.   MRN: [id number]  HPI  Nathaniel White is here to see me for shortness of breath.  He notes that walking just short distances like < 50 yards will make him short of breath.  Just walking on level ground makes him short of breath.  He has a hard time sleeping due to dyspnea at night as well.  He previously smoked and quit about one year ago.  He smoked about 1/2 to 1 pack per cigarettes for about 30 years or so.    He has never been told he had a lung problem in the past.  He never had a severe infection as a child.   He had a major respiratory infection and ended up having a loculated pleural effusion requiring a VATS thoracotomy in 2013.  The fluid was drained and was found to be sterile. Ever since that procedure he has struggled with cough. Since then he has been treated with antibiotics several times since then for respiratory infections.  He notes that he has had a lot of clear mucus production for several years, before 2013.  He feels chest tightness with bad coughing spells.  The cough lasts all day and makes things worse.  He says that medications typically help but then wear off after taking.    He has been prescribed albuterol which helps when he takes.  He has never had lung function testing that he can recall.  He had a kidney transplant at [facility] 3-4 years ago that failed.    He vomits sometimes with hard cough, this often occurrs several times per month, may once per week.    Past Medical History  Diagnosis Date  . Hypertension   . Renal insufficiency   . Anemia   . Stroke   . Hemiparesis due to old stroke     left  . LV dysfunction     EF 25-30% by echo 07/2011  . MR (mitral regurgitation)     moderate to severe, echo 07/2011  . Shortness of breath   . Diabetes mellitus without complication      Family History  Problem Relation Age of Onset  . Hypertension Mother   . Varicose Veins Mother     . Diabetes Paternal Grandmother   . Cancer Paternal Grandfather      History   Social History  . Marital Status: Single    Spouse Name: N/A    Number of Children: N/A  . Years of Education: N/A   Occupational History  . Not on file.   Social History Main Topics  . Smoking status: Former Smoker -- 0.50 packs/day for 15 years    Types: Cigarettes    Quit date: 10/13/2012  . Smokeless tobacco: Never Used  . Alcohol Use: No  . Drug Use: No  . Sexual Activity: Not on file   Other Topics Concern  . Not on file   Social History Narrative  . No narrative on file     Allergies  Allergen Reactions  . Codeine Shortness Of Breath     Outpatient Prescriptions Prior to Visit  Medication Sig Dispense Refill  . albuterol (PROVENTIL HFA;VENTOLIN HFA) 108 (90 BASE) MCG/ACT inhaler Inhale 1-2 puffs into the lungs every 6 (six) hours as needed for wheezing.  1 Inhaler  0  . amLODipine (NORVASC) 10 MG tablet Take 10 mg by mouth every evening.      Marland Kitchen  benzonatate (TESSALON) 100 MG capsule Take 1 capsule (100 mg total) by mouth 3 (three) times daily as needed for cough.  30 capsule  1  . chlorpheniramine-HYDROcodone (TUSSIONEX PENNKINETIC ER) 10-8 MG/5ML LQCR Take 5 mLs by mouth every 12 (twelve) hours as needed for cough.  70 mL  0  . clonazePAM (KLONOPIN) 1 MG tablet Take 1 mg by mouth at bedtime.      Marland Kitchen labetalol (NORMODYNE) 300 MG tablet Take 150 mg by mouth 2 (two) times daily.      . traMADol (ULTRAM) 50 MG tablet Take 1 tablet (50 mg total) by mouth every 6 (six) hours as needed. Take 1-2 tablets by mouth every 6 hours for pain  20 tablet  0   No facility-administered medications prior to visit.      Review of Systems  Constitutional: Negative for fever and unexpected weight change.  HENT: Positive for congestion. Negative for dental problem, ear pain, nosebleeds, postnasal drip, rhinorrhea, sinus pressure, sneezing, sore throat and trouble swallowing.   Eyes: Negative for  redness and itching.  Respiratory: Positive for cough and shortness of breath. Negative for chest tightness and wheezing.   Cardiovascular: Negative for palpitations and leg swelling.  Gastrointestinal: Negative for nausea and vomiting.  Genitourinary: Negative for dysuria.  Musculoskeletal: Negative for joint swelling.  Skin: Negative for rash.  Neurological: Negative for headaches.  Hematological: Does not bruise/bleed easily.  Psychiatric/Behavioral: Negative for dysphoric mood. The patient is not nervous/anxious.        Objective:   Physical Exam  Filed Vitals:   09/07/13 1415  BP: 138/78  Pulse: 96  Height: 5\' 8"  (1.727 m)  Weight: 190 lb (86.183 kg)  SpO2: 97%   RA  Gen: chronically ill appearing, no acute distress HEENT: NCAT, PERRL, EOMi, OP clear, neck supple without masses PULM: diminished RLL CV: RRR, holosystolic murmur apex, radiates to axilla, marked JVD AB: BS+, soft, nontender, no hsm Ext: warm, notable leg edema, no clubbing, no cyanosis; large left arm fistula with thrill Derm: no rash or skin breakdown Neuro: A&Ox4, CN II-XII intact, strength 5/5 in all 4 extremities       Assessment & Plan:   Shortness of breath He has a history of a complicated loculated effusion treated by VATS in 2013, but PFTs at that time did not show evidence of COPD or asthma as he had no airflow obstruction.  There was some evidence of restriction which could have been related to the pleural effusion.  At this point I think that there is a high index of suspicion for COPD, so I will start Advair and repeat PFTs to see if there is obstruction now that the effusion has been managed.  We also need to consider the restriction from pleural scarring and he may need to have another CT scan of his chest to better evaluate this.  Finally, he is volume overloaded today and so his CHF is also contributing to his dyspnea.  Plan: -full PFT -trial Advair -referral to cardiology for  CHF -continue volume removal with HD down to dry weight (about 170lbs per patient, he is currently 180lbs)  ESRD (end stage renal disease) on dialysis Currently 10lbs over his dry weight.  Continue volume removal with HD.  CHF, systolic dysfunction Currently volume overloaded, referral to cardiology  He probably also has pulmonary hypertension to some degree, may need to consider right heart catheterization at some point.  Cough I worry that he has recurrent aspiration given his prior  R sided effusion of uncertain etiology.  He appeared to have right lung scarring on his 2013 CT chest.    At this point I will start by treating possible airways disease with Advair and adding tessalon for cough.  However, he may need a CT to evaluate the RLL, will f/u PFTs first.    Updated Medication List Outpatient Encounter Prescriptions as of 09/07/2013  Medication Sig  . albuterol (PROVENTIL HFA;VENTOLIN HFA) 108 (90 BASE) MCG/ACT inhaler Inhale 1-2 puffs into the lungs every 6 (six) hours as needed for wheezing.  Marland Kitchen amLODipine (NORVASC) 10 MG tablet Take 10 mg by mouth every evening.  . benzonatate (TESSALON) 100 MG capsule Take 1 capsule (100 mg total) by mouth 3 (three) times daily as needed for cough.  . chlorpheniramine-HYDROcodone (TUSSIONEX PENNKINETIC ER) 10-8 MG/5ML LQCR Take 5 mLs by mouth every 12 (twelve) hours as needed for cough.  . clonazePAM (KLONOPIN) 1 MG tablet Take 1 mg by mouth at bedtime.  Marland Kitchen labetalol (NORMODYNE) 300 MG tablet Take 150 mg by mouth 2 (two) times daily.  . traMADol (ULTRAM) 50 MG tablet Take 1 tablet (50 mg total) by mouth every 6 (six) hours as needed. Take 1-2 tablets by mouth every 6 hours for pain  . benzonatate (TESSALON) 100 MG capsule Take 1 capsule (100 mg total) by mouth every 6 (six) hours as needed for cough.  . Fluticasone-Salmeterol (ADVAIR DISKUS) 250-50 MCG/DOSE AEPB Inhale 1 puff into the lungs 2 (two) times daily.

## 2013-09-07 NOTE — Assessment & Plan Note (Signed)
Currently 10lbs over his dry weight.  Continue volume removal with HD.

## 2013-09-07 NOTE — Assessment & Plan Note (Signed)
I worry that he has recurrent aspiration given his prior R sided effusion of uncertain etiology.  He appeared to have right lung scarring on his 2013 CT chest.    At this point I will start by treating possible airways disease with Advair and adding tessalon for cough.  However, he may need a CT to evaluate the RLL, will f/u PFTs first.

## 2013-09-08 DIAGNOSIS — N2581 Secondary hyperparathyroidism of renal origin: Secondary | ICD-10-CM | POA: Diagnosis not present

## 2013-09-08 DIAGNOSIS — N186 End stage renal disease: Secondary | ICD-10-CM | POA: Diagnosis not present

## 2013-09-08 DIAGNOSIS — D631 Anemia in chronic kidney disease: Secondary | ICD-10-CM | POA: Diagnosis not present

## 2013-09-08 DIAGNOSIS — N039 Chronic nephritic syndrome with unspecified morphologic changes: Secondary | ICD-10-CM | POA: Diagnosis not present

## 2013-09-08 DIAGNOSIS — E119 Type 2 diabetes mellitus without complications: Secondary | ICD-10-CM | POA: Diagnosis not present

## 2013-09-09 DIAGNOSIS — E8779 Other fluid overload: Secondary | ICD-10-CM | POA: Diagnosis not present

## 2013-09-09 DIAGNOSIS — N186 End stage renal disease: Secondary | ICD-10-CM | POA: Diagnosis not present

## 2013-09-10 ENCOUNTER — Encounter: Payer: Self-pay | Admitting: Surgery

## 2013-09-10 DIAGNOSIS — N186 End stage renal disease: Secondary | ICD-10-CM | POA: Diagnosis not present

## 2013-09-10 DIAGNOSIS — N2581 Secondary hyperparathyroidism of renal origin: Secondary | ICD-10-CM | POA: Diagnosis not present

## 2013-09-10 DIAGNOSIS — D631 Anemia in chronic kidney disease: Secondary | ICD-10-CM | POA: Diagnosis not present

## 2013-09-10 DIAGNOSIS — E119 Type 2 diabetes mellitus without complications: Secondary | ICD-10-CM | POA: Diagnosis not present

## 2013-09-11 ENCOUNTER — Ambulatory Visit (INDEPENDENT_AMBULATORY_CARE_PROVIDER_SITE_OTHER): Payer: Medicare Other | Admitting: Surgery

## 2013-09-11 ENCOUNTER — Encounter: Payer: Self-pay | Admitting: Surgery

## 2013-09-11 VITALS — BP 166/98 | HR 109 | Ht 68.0 in | Wt 183.0 lb

## 2013-09-11 DIAGNOSIS — N186 End stage renal disease: Secondary | ICD-10-CM

## 2013-09-11 DIAGNOSIS — T82898A Other specified complication of vascular prosthetic devices, implants and grafts, initial encounter: Secondary | ICD-10-CM

## 2013-09-11 NOTE — Progress Notes (Signed)
[facility] HISTORY AND PHYSICAL   HPI: This is a 53 y.o. male who is here for two week follow up s/p excision of eroded skin and exploration of his left upper arm AV fistula on 08/26/13 by Dr. Donnetta Hutching. He was seen in the office the day before before due to bleeding and ulceration from his fistula after dialysis. There was an 59mm eschar over the distal end of his fistula. In the OR, the eschar was removed and did not appear to go into the vein. The vein was left alone and the skin was closed.   He denies any current issues with bleeding from his fistula. He dialyzed on this past Tuesday, Wednesday, and Thursday due to hypervolemia. He denies any hand pain, numbness, or non healing wounds of the hands.   Past Medical History  Diagnosis Date  . Hypertension   . Renal insufficiency   . Anemia   . Stroke   . Hemiparesis due to old stroke     left  . LV dysfunction     EF 25-30% by echo 07/2011  . MR (mitral regurgitation)     moderate to severe, echo 07/2011  . Shortness of breath   . Diabetes mellitus without complication    Past Surgical History  Procedure Laterality Date  . Kidney transplant      2011 rejected kidney 2012 back on dialysis  . Av fistula placement    . Cardiac catheterization      [facility] without cardioversion  08/17/2011    Procedure: TRANSESOPHAGEAL ECHOCARDIOGRAM (TEE);  Surgeon: Larey Dresser, MD;  Location: [facility];  Service: Cardiovascular;  Laterality: N/A;  . [facility] of arteriovenous fistula Left 08/26/2013    Procedure: EXCISION OF ERODED SKIN AND EXPLORATION OF MAIN LEFT UPPER ARM AV FISTULA;  Surgeon: Rosetta Posner, MD;  Location: [facility];  Service: Vascular;  Laterality: Left;    Allergies  Allergen Reactions  . Codeine Shortness Of Breath    Current Outpatient Prescriptions  Medication Sig Dispense Refill  . albuterol (PROVENTIL HFA;VENTOLIN HFA) 108 (90 BASE) MCG/ACT inhaler Inhale 1-2 puffs into the  lungs every 6 (six) hours as needed for wheezing.  1 Inhaler  0  . amLODipine (NORVASC) 10 MG tablet Take 10 mg by mouth every evening.      . benzonatate (TESSALON) 100 MG capsule Take 1 capsule (100 mg total) by mouth 3 (three) times daily as needed for cough.  30 capsule  1  . benzonatate (TESSALON) 100 MG capsule Take 1 capsule (100 mg total) by mouth every 6 (six) hours as needed for cough.  30 capsule  1  . chlorpheniramine-HYDROcodone (TUSSIONEX PENNKINETIC ER) 10-8 MG/5ML LQCR Take 5 mLs by mouth every 12 (twelve) hours as needed for cough.  70 mL  0  . clonazePAM (KLONOPIN) 1 MG tablet Take 1 mg by mouth at bedtime.      . Fluticasone-Salmeterol (ADVAIR DISKUS) 250-50 MCG/DOSE AEPB Inhale 1 puff into the lungs 2 (two) times daily.  60 each  5  . labetalol (NORMODYNE) 300 MG tablet Take 150 mg by mouth 2 (two) times daily.      . traMADol (ULTRAM) 50 MG tablet Take 1 tablet (50 mg total) by mouth every 6 (six) hours as needed. Take 1-2 tablets by mouth every 6 hours for pain  20 tablet  0   No current facility-administered medications for this visit.    Family History  Problem  Relation Age of Onset  . Hypertension Mother   . Varicose Veins Mother   . Diabetes Paternal Grandmother   . Cancer Paternal Grandfather     History   Social History  . Marital Status: Single    Spouse Name: N/A    Number of Children: N/A  . Years of Education: N/A   Occupational History  . Not on file.   Social History Main Topics  . Smoking status: Former Smoker -- 0.50 packs/day for 15 years    Types: Cigarettes    Quit date: 10/13/2012  . Smokeless tobacco: Never Used  . Alcohol Use: No  . Drug Use: No  . Sexual Activity: Not on file   Other Topics Concern  . Not on file   Social History Narrative  . No narrative on file    PHYSICAL EXAMINATION:  Filed Vitals:   09/11/13 1258  BP: 166/98  Pulse: 109   Body mass index is 27.83 kg/(m^2).  Palpable 2+ radial and ulnar pulses  left hand. 5/5 grip strength bilaterally. Palpable thrill and audible bruit of left AV fistula. Incision over fistula is healed. Nylon sutures intact.   ASSESSMENT: 53 y.o. male with ESRD on HD s/p  s/p excision of eroded skin and exploration of his left upper arm AV fistula on 08/26/13   PLAN: Incision is healing well. No further bleeding from fistula. He denies any steal symptoms. Continue using fistula for dialysis. Will remove sutures today.    Virgina Jock, PA-C [facility] [phone]  Clinic MD:  Pt seen and examined in conjunction with Dr. Trula Slade    I agree with the above.  The patient's wound is healing.  He will followup on an as-needed basis.  There has been no significant interval change in the aneurysmal fistula  Wells Brabham

## 2013-09-12 DIAGNOSIS — E119 Type 2 diabetes mellitus without complications: Secondary | ICD-10-CM | POA: Diagnosis not present

## 2013-09-12 DIAGNOSIS — N2581 Secondary hyperparathyroidism of renal origin: Secondary | ICD-10-CM | POA: Diagnosis not present

## 2013-09-12 DIAGNOSIS — N186 End stage renal disease: Secondary | ICD-10-CM | POA: Diagnosis not present

## 2013-09-12 DIAGNOSIS — N039 Chronic nephritic syndrome with unspecified morphologic changes: Secondary | ICD-10-CM | POA: Diagnosis not present

## 2013-09-12 DIAGNOSIS — D631 Anemia in chronic kidney disease: Secondary | ICD-10-CM | POA: Diagnosis not present

## 2013-09-14 ENCOUNTER — Emergency Department (HOSPITAL_BASED_OUTPATIENT_CLINIC_OR_DEPARTMENT_OTHER)
Admission: EM | Admit: 2013-09-14 | Discharge: 2013-09-15 | Disposition: A | Discharge status: Home / Self Care | Payer: Medicare Other | Attending: Emergency Medicine | Admitting: Emergency Medicine

## 2013-09-14 ENCOUNTER — Encounter (HOSPITAL_BASED_OUTPATIENT_CLINIC_OR_DEPARTMENT_OTHER): Payer: Self-pay | Admitting: Emergency Medicine

## 2013-09-14 DIAGNOSIS — Z8673 Personal history of transient ischemic attack (TIA), and cerebral infarction without residual deficits: Secondary | ICD-10-CM | POA: Diagnosis not present

## 2013-09-14 DIAGNOSIS — J4489 Other specified chronic obstructive pulmonary disease: Secondary | ICD-10-CM | POA: Diagnosis not present

## 2013-09-14 DIAGNOSIS — Z992 Dependence on renal dialysis: Secondary | ICD-10-CM | POA: Diagnosis not present

## 2013-09-14 DIAGNOSIS — R0602 Shortness of breath: Secondary | ICD-10-CM | POA: Insufficient documentation

## 2013-09-14 DIAGNOSIS — J449 Chronic obstructive pulmonary disease, unspecified: Secondary | ICD-10-CM | POA: Diagnosis not present

## 2013-09-14 DIAGNOSIS — I120 Hypertensive chronic kidney disease with stage 5 chronic kidney disease or end stage renal disease: Secondary | ICD-10-CM | POA: Diagnosis not present

## 2013-09-14 DIAGNOSIS — Z87891 Personal history of nicotine dependence: Secondary | ICD-10-CM | POA: Insufficient documentation

## 2013-09-14 DIAGNOSIS — N186 End stage renal disease: Secondary | ICD-10-CM | POA: Diagnosis not present

## 2013-09-14 DIAGNOSIS — J984 Other disorders of lung: Secondary | ICD-10-CM | POA: Diagnosis not present

## 2013-09-14 DIAGNOSIS — R059 Cough, unspecified: Secondary | ICD-10-CM | POA: Diagnosis not present

## 2013-09-14 DIAGNOSIS — R011 Cardiac murmur, unspecified: Secondary | ICD-10-CM | POA: Diagnosis not present

## 2013-09-14 DIAGNOSIS — Z862 Personal history of diseases of the blood and blood-forming organs and certain disorders involving the immune mechanism: Secondary | ICD-10-CM | POA: Diagnosis not present

## 2013-09-14 DIAGNOSIS — R05 Cough: Secondary | ICD-10-CM | POA: Diagnosis not present

## 2013-09-14 DIAGNOSIS — J209 Acute bronchitis, unspecified: Secondary | ICD-10-CM | POA: Diagnosis not present

## 2013-09-14 DIAGNOSIS — I509 Heart failure, unspecified: Secondary | ICD-10-CM | POA: Insufficient documentation

## 2013-09-14 DIAGNOSIS — E119 Type 2 diabetes mellitus without complications: Secondary | ICD-10-CM | POA: Diagnosis not present

## 2013-09-14 HISTORY — DX: Chronic obstructive pulmonary disease, unspecified: J44.9

## 2013-09-14 HISTORY — DX: Heart failure, unspecified: I50.9

## 2013-09-14 NOTE — ED Notes (Signed)
PT presents to ED with c/o shortness of breath  And cough that started today.

## 2013-09-15 ENCOUNTER — Emergency Department (HOSPITAL_BASED_OUTPATIENT_CLINIC_OR_DEPARTMENT_OTHER): Payer: Medicare Other

## 2013-09-15 DIAGNOSIS — E119 Type 2 diabetes mellitus without complications: Secondary | ICD-10-CM | POA: Diagnosis not present

## 2013-09-15 DIAGNOSIS — D631 Anemia in chronic kidney disease: Secondary | ICD-10-CM | POA: Diagnosis not present

## 2013-09-15 DIAGNOSIS — J209 Acute bronchitis, unspecified: Secondary | ICD-10-CM | POA: Diagnosis not present

## 2013-09-15 DIAGNOSIS — R05 Cough: Secondary | ICD-10-CM | POA: Diagnosis not present

## 2013-09-15 DIAGNOSIS — N039 Chronic nephritic syndrome with unspecified morphologic changes: Secondary | ICD-10-CM | POA: Diagnosis not present

## 2013-09-15 DIAGNOSIS — R059 Cough, unspecified: Secondary | ICD-10-CM | POA: Diagnosis not present

## 2013-09-15 DIAGNOSIS — N186 End stage renal disease: Secondary | ICD-10-CM | POA: Diagnosis not present

## 2013-09-15 DIAGNOSIS — J984 Other disorders of lung: Secondary | ICD-10-CM | POA: Diagnosis not present

## 2013-09-15 DIAGNOSIS — N2581 Secondary hyperparathyroidism of renal origin: Secondary | ICD-10-CM | POA: Diagnosis not present

## 2013-09-15 MED ORDER — ALBUTEROL SULFATE HFA 108 (90 BASE) MCG/ACT IN AERS: 2.0000 | INHALATION_SPRAY | RESPIRATORY_TRACT | Status: DC | PRN

## 2013-09-15 MED ORDER — PREDNISONE 50 MG PO TABS: 50.0000 mg | ORAL_TABLET | Freq: Every day | ORAL | Status: DC

## 2013-09-15 MED ORDER — IPRATROPIUM-ALBUTEROL 0.5-2.5 (3) MG/3ML IN SOLN
3.0000 mL | Freq: Once | RESPIRATORY_TRACT | Status: AC
  Administered 2013-09-15: 3 mL via RESPIRATORY_TRACT
  Filled 2013-09-15: qty 3

## 2013-09-15 MED ORDER — IPRATROPIUM-ALBUTEROL 0.5-2.5 (3) MG/3ML IN SOLN
RESPIRATORY_TRACT | Status: AC
  Filled 2013-09-15: qty 3

## 2013-09-15 MED ORDER — PREDNISONE 50 MG PO TABS
60.0000 mg | ORAL_TABLET | Freq: Once | ORAL | Status: AC
  Administered 2013-09-15: 60 mg via ORAL
  Filled 2013-09-15 (×2): qty 1

## 2013-09-15 MED ORDER — FLUTICASONE-SALMETEROL 250-50 MCG/DOSE IN AEPB: 1.0000 | INHALATION_SPRAY | Freq: Two times a day (BID) | RESPIRATORY_TRACT | Status: DC

## 2013-09-15 NOTE — Discharge Instructions (Signed)
Do not allow your Advair or Albuterol to run out. Get prescriptions refilled while you still have at least one week of use left.  Acute Bronchitis Bronchitis is inflammation of the airways that extend from the windpipe into the lungs (bronchi). The inflammation often causes mucus to develop. This leads to a cough, which is the most common symptom of bronchitis.  In acute bronchitis, the condition usually develops suddenly and goes away over time, usually in a couple weeks. Smoking, allergies, and asthma can make bronchitis worse. Repeated episodes of bronchitis may cause further lung problems.  CAUSES Acute bronchitis is most often caused by the same virus that causes a cold. The virus can spread from person to person (contagious) through coughing, sneezing, and touching contaminated objects. SIGNS AND SYMPTOMS   Cough.   Fever.   Coughing up mucus.   Body aches.   Chest congestion.   Chills.   Shortness of breath.   Sore throat.  DIAGNOSIS  Acute bronchitis is usually diagnosed through a physical exam. Your health care provider will also ask you questions about your medical history. Tests, such as chest X-rays, are sometimes done to rule out other conditions.  TREATMENT  Acute bronchitis usually goes away in a couple weeks. Oftentimes, no medical treatment is necessary. Medicines are sometimes given for relief of fever or cough. Antibiotic medicines are usually not needed but may be prescribed in certain situations. In some cases, an inhaler may be recommended to help reduce shortness of breath and control the cough. A cool mist vaporizer may also be used to help thin bronchial secretions and make it easier to clear the chest.  HOME CARE INSTRUCTIONS  Get plenty of rest.   Drink enough fluids to keep your urine clear or pale yellow (unless you have a medical condition that requires fluid restriction). Increasing fluids may help thin your respiratory secretions (sputum) and  reduce chest congestion, and it will prevent dehydration.   Take medicines only as directed by your health care provider.  If you were prescribed an antibiotic medicine, finish it all even if you start to feel better.  Avoid smoking and secondhand smoke. Exposure to cigarette smoke or irritating chemicals will make bronchitis worse. If you are a smoker, consider using nicotine gum or skin patches to help control withdrawal symptoms. Quitting smoking will help your lungs heal faster.   Reduce the chances of another bout of acute bronchitis by washing your hands frequently, avoiding people with cold symptoms, and trying not to touch your hands to your mouth, nose, or eyes.   Keep all follow-up visits as directed by your health care provider.  SEEK MEDICAL CARE IF: Your symptoms do not improve after 1 week of treatment.  SEEK IMMEDIATE MEDICAL CARE IF:  You develop an increased fever or chills.   You have chest pain.   You have severe shortness of breath.  You have bloody sputum.   You develop dehydration.  You faint or repeatedly feel like you are going to pass out.  You develop repeated vomiting.  You develop a severe headache. MAKE SURE YOU:   Understand these instructions.  Will watch your condition.  Will get help right away if you are not doing well or get worse. Document Released: 02/02/2004 Document Revised: 05/11/2013 Document Reviewed: 06/17/2012 [facility] Patient Information 2015 [place], [place]. This information is not intended to replace advice given to you by your health care provider. Make sure you discuss any questions you have with your health care  provider.  Prednisone tablets What is this medicine? PREDNISONE (PRED ni sone) is a corticosteroid. It is commonly used to treat inflammation of the skin, joints, lungs, and other organs. Common conditions treated include asthma, allergies, and arthritis. It is also used for other conditions, such as blood  disorders and diseases of the adrenal glands. This medicine may be used for other purposes; ask your health care provider or pharmacist if you have questions. COMMON BRAND NAME(S): Deltasone, Predone, Sterapred, Sterapred DS What should I tell my health care provider before I take this medicine? They need to know if you have any of these conditions: -Cushing's syndrome -diabetes -glaucoma -heart disease -high blood pressure -infection (especially a virus infection such as chickenpox, cold sores, or herpes) -kidney disease -liver disease -mental illness -myasthenia gravis -osteoporosis -seizures -stomach or intestine problems -thyroid disease -an unusual or allergic reaction to lactose, prednisone, other medicines, foods, dyes, or preservatives -pregnant or trying to get pregnant -breast-feeding How should I use this medicine? Take this medicine by mouth with a glass of water. Follow the directions on the prescription label. Take this medicine with food. If you are taking this medicine once a day, take it in the morning. Do not take more medicine than you are told to take. Do not suddenly stop taking your medicine because you may develop a severe reaction. Your doctor will tell you how much medicine to take. If your doctor wants you to stop the medicine, the dose may be slowly lowered over time to avoid any side effects. Talk to your pediatrician regarding the use of this medicine in children. Special care may be needed. Overdosage: If you think you have taken too much of this medicine contact a poison control center or emergency room at once. NOTE: This medicine is only for you. Do not share this medicine with others. What if I miss a dose? If you miss a dose, take it as soon as you can. If it is almost time for your next dose, talk to your doctor or health care professional. You may need to miss a dose or take an extra dose. Do not take double or extra doses without advice. What may  interact with this medicine? Do not take this medicine with any of the following medications: -metyrapone -mifepristone This medicine may also interact with the following medications: -aminoglutethimide -amphotericin B -aspirin and aspirin-like medicines -barbiturates -certain medicines for diabetes, like glipizide or glyburide -cholestyramine -cholinesterase inhibitors -cyclosporine -digoxin -diuretics -ephedrine -male hormones, like estrogens and birth control pills -isoniazid -ketoconazole -NSAIDS, medicines for pain and inflammation, like ibuprofen or naproxen -phenytoin -rifampin -toxoids -vaccines -warfarin This list may not describe all possible interactions. Give your health care provider a list of all the medicines, herbs, non-prescription drugs, or dietary supplements you use. Also tell them if you smoke, drink alcohol, or use illegal drugs. Some items may interact with your medicine. What should I watch for while using this medicine? Visit your doctor or health care professional for regular checks on your progress. If you are taking this medicine over a prolonged period, carry an identification card with your name and address, the type and dose of your medicine, and your doctor's name and address. This medicine may increase your risk of getting an infection. Tell your doctor or health care professional if you are around anyone with measles or chickenpox, or if you develop sores or blisters that do not heal properly. If you are going to have surgery, tell your doctor or health  care professional that you have taken this medicine within the last twelve months. Ask your doctor or health care professional about your diet. You may need to lower the amount of salt you eat. This medicine may affect blood sugar levels. If you have diabetes, check with your doctor or health care professional before you change your diet or the dose of your diabetic medicine. What side effects may I  notice from receiving this medicine? Side effects that you should report to your doctor or health care professional as soon as possible: -allergic reactions like skin rash, itching or hives, swelling of the face, lips, or tongue -changes in emotions or moods -changes in vision -depressed mood -eye pain -fever or chills, cough, sore throat, pain or difficulty passing urine -increased thirst -swelling of ankles, feet Side effects that usually do not require medical attention (report to your doctor or health care professional if they continue or are bothersome): -confusion, excitement, restlessness -headache -nausea, vomiting -skin problems, acne, thin and shiny skin -trouble sleeping -weight gain This list may not describe all possible side effects. Call your doctor for medical advice about side effects. You may report side effects to FDA at 1-800-FDA-1088. Where should I keep my medicine? Keep out of the reach of children. Store at room temperature between 15 and 30 degrees C (59 and 86 degrees F). Protect from light. Keep container tightly closed. Throw away any unused medicine after the expiration date. NOTE: This sheet is a summary. It may not cover all possible information. If you have questions about this medicine, talk to your doctor, pharmacist, or health care provider.  2015, [employer]. (2010-08-10 10:57:14)

## 2013-09-15 NOTE — ED Notes (Signed)
MD at bedside discussing test results and dispo plan of care. 

## 2013-09-15 NOTE — ED Notes (Signed)
Patient transported to X-ray 

## 2013-09-15 NOTE — ED Provider Notes (Signed)
CSN: [id number]     Arrival date & time 09/14/13  2324 History   First MD Initiated Contact with Patient 09/15/13 0004    This chart was scribed for Nathaniel Fuel, MD by Edison Simon, ED Scribe. This patient was seen in room MH05/MH05 and the patient's care was started at 12:03 AM.   Chief Complaint  Patient presents with  . Shortness of Breath   The history is provided by the patient. No language interpreter was used.    HPI Comments: Nathaniel White is a 53 y.o. male with history of COPD who presents to the Emergency Department complaining of cough and shortness of breath with onset today. He states his cough produces clear sputum. He reports running out of Advair today and that his rescue inhaler is empty as well. He denies fever, chills, or sweats.  Past Medical History  Diagnosis Date  . Hypertension   . Renal insufficiency   . Anemia   . Stroke   . Hemiparesis due to old stroke     left  . LV dysfunction     EF 25-30% by echo 07/2011  . MR (mitral regurgitation)     moderate to severe, echo 07/2011  . Shortness of breath   . Diabetes mellitus without complication   . CHF (congestive heart failure)   . COPD (chronic obstructive pulmonary disease)    Past Surgical History  Procedure Laterality Date  . Kidney transplant      2011 rejected kidney 2012 back on dialysis  . Av fistula placement    . Cardiac catheterization      [place] without cardioversion  08/17/2011    Procedure: TRANSESOPHAGEAL ECHOCARDIOGRAM (TEE);  Surgeon: Larey Dresser, MD;  Location: [place];  Service: Cardiovascular;  Laterality: N/A;  . [place] of arteriovenous fistula Left 08/26/2013    Procedure: EXCISION OF ERODED SKIN AND EXPLORATION OF MAIN LEFT UPPER ARM AV FISTULA;  Surgeon: Rosetta Posner, MD;  Location: [facility] OR;  Service: Vascular;  Laterality: Left;   Family History  Problem Relation Age of Onset  . Hypertension Mother   . Varicose Veins Mother   . Diabetes Paternal  Grandmother   . Cancer Paternal Grandfather    History  Substance Use Topics  . Smoking status: Former Smoker -- 0.50 packs/day for 15 years    Types: Cigarettes    Quit date: 10/13/2012  . Smokeless tobacco: Never Used  . Alcohol Use: No    Review of Systems  Constitutional: Negative for fever and chills.  Respiratory: Positive for cough and shortness of breath.   Skin:       Denies sweats  All other systems reviewed and are negative.     Allergies  Codeine  Home Medications   Prior to Admission medications   Medication Sig Start Date End Date Taking? Authorizing Provider  albuterol (PROVENTIL HFA;VENTOLIN HFA) 108 (90 BASE) MCG/ACT inhaler Inhale 1-2 puffs into the lungs every 6 (six) hours as needed for wheezing. 07/01/12   April K Palumbo-Rasch, MD  amLODipine (NORVASC) 10 MG tablet Take 10 mg by mouth every evening.    Historical Provider, MD  benzonatate (TESSALON) 100 MG capsule Take 1 capsule (100 mg total) by mouth 3 (three) times daily as needed for cough. 08/14/13   Lorayne Marek, MD  benzonatate (TESSALON) 100 MG capsule Take 1 capsule (100 mg total) by mouth every 6 (six) hours as needed for cough. 09/07/13   Juanito Doom, MD  chlorpheniramine-HYDROcodone (TUSSIONEX PENNKINETIC ER) 10-8 MG/5ML LQCR Take 5 mLs by mouth every 12 (twelve) hours as needed for cough. 07/20/13   Shari A Upstill, PA-C  clonazePAM (KLONOPIN) 1 MG tablet Take 1 mg by mouth at bedtime.    Historical Provider, MD  Fluticasone-Salmeterol (ADVAIR DISKUS) 250-50 MCG/DOSE AEPB Inhale 1 puff into the lungs 2 (two) times daily. 09/07/13   Juanito Doom, MD  labetalol (NORMODYNE) 300 MG tablet Take 150 mg by mouth 2 (two) times daily.    Historical Provider, MD  traMADol (ULTRAM) 50 MG tablet Take 1 tablet (50 mg total) by mouth every 6 (six) hours as needed. Take 1-2 tablets by mouth every 6 hours for pain 08/26/13   Joelene Millin A Trinh, PA-C   BP 144/97  Pulse 105  Temp(Src) 97.9 F (36.6 C)  (Oral)  Resp 18  Ht 5\' 9"  (1.753 m)  Wt 180 lb (81.647 kg)  BMI 26.57 kg/m2  SpO2 98% Physical Exam  Nursing note and vitals reviewed. Constitutional: He is oriented to person, place, and time. He appears well-developed and well-nourished.  HENT:  Head: Normocephalic and atraumatic.  Eyes: Conjunctivae are normal.  Neck: Normal range of motion. Neck supple.  Cardiovascular: Normal rate and regular rhythm.   Murmur (3/6 systolic ejection at the upper left sternal border) heard. Pulmonary/Chest: Effort normal. He has no wheezes. He has no rales.  Prolonged exhalation phase  Abdominal: Soft.  Musculoskeletal: Normal range of motion.  AV fistula present left upper arm with thrill present  Neurological: He is alert and oriented to person, place, and time.  Skin: Skin is warm and dry.  Psychiatric: He has a normal mood and affect.    ED Course  Procedures (including critical care time) Imaging Review Dg Chest 2 View  09/15/2013   CLINICAL DATA:  Shortness of breath.  Productive cough.  EXAM: CHEST  2 VIEW  COMPARISON:  07/27/2013  FINDINGS: Cardiac enlargement without pulmonary vascular congestion. Volume loss in the right lung with right lower lung opacities and blunting of costophrenic angles. Appearance is stable since previous study and likely represents scarring in the lung base with thickened pleura. Mild diffuse interstitial pattern to the lungs could represent developing interstitial edema or pneumonitis. No pneumothorax.  IMPRESSION: Unchanged appearance of chronic opacity and pleural thickening on the right lung. Suggestion of developing superimposed interstitial infiltration.   Electronically Signed   By: Lucienne Capers M.D.   On: 09/15/2013 00:58    DIAGNOSTIC STUDIES: Oxygen Saturation is 98% on room air, normal by my interpretation.    COORDINATION OF CARE:    MDM   Final diagnoses:  Acute bronchitis, unspecified organism  End stage renal disease on dialysis     Cough with evidence of bronchospasm which probably represents acute bronchitis. He has run out of his Advair, but being out of that for one day should not trigger a flareup such as this. Chest x-ray is obtained which shows no evidence of pneumonia. He was given a dose of prednisone and a breathing treatment with albuterol and ipratropium with significant subjective and objective improvement. He is discharged with prescriptions for prednisone and new prescriptions for albuterol inhaler and Advair. He is to follow up with his PCP in the next week and he is advised to make sure that he is never at home without his inhalers.  I personally performed the services described in this documentation, which was scribed in my presence. The recorded information has been reviewed and is accurate.  Nathaniel Fuel, MD [phone]

## 2013-09-15 NOTE — ED Notes (Signed)
MD at bedside. 

## 2013-09-17 DIAGNOSIS — D631 Anemia in chronic kidney disease: Secondary | ICD-10-CM | POA: Diagnosis not present

## 2013-09-17 DIAGNOSIS — N2581 Secondary hyperparathyroidism of renal origin: Secondary | ICD-10-CM | POA: Diagnosis not present

## 2013-09-17 DIAGNOSIS — E119 Type 2 diabetes mellitus without complications: Secondary | ICD-10-CM | POA: Diagnosis not present

## 2013-09-17 DIAGNOSIS — N186 End stage renal disease: Secondary | ICD-10-CM | POA: Diagnosis not present

## 2013-09-19 DIAGNOSIS — N186 End stage renal disease: Secondary | ICD-10-CM | POA: Diagnosis not present

## 2013-09-19 DIAGNOSIS — N2581 Secondary hyperparathyroidism of renal origin: Secondary | ICD-10-CM | POA: Diagnosis not present

## 2013-09-19 DIAGNOSIS — D631 Anemia in chronic kidney disease: Secondary | ICD-10-CM | POA: Diagnosis not present

## 2013-09-19 DIAGNOSIS — E119 Type 2 diabetes mellitus without complications: Secondary | ICD-10-CM | POA: Diagnosis not present

## 2013-09-22 ENCOUNTER — Other Ambulatory Visit (HOSPITAL_COMMUNITY): Payer: Medicare Other

## 2013-09-22 DIAGNOSIS — N186 End stage renal disease: Secondary | ICD-10-CM | POA: Diagnosis not present

## 2013-09-22 DIAGNOSIS — D631 Anemia in chronic kidney disease: Secondary | ICD-10-CM | POA: Diagnosis not present

## 2013-09-22 DIAGNOSIS — N2581 Secondary hyperparathyroidism of renal origin: Secondary | ICD-10-CM | POA: Diagnosis not present

## 2013-09-22 DIAGNOSIS — E119 Type 2 diabetes mellitus without complications: Secondary | ICD-10-CM | POA: Diagnosis not present

## 2013-09-23 ENCOUNTER — Other Ambulatory Visit (HOSPITAL_COMMUNITY): Payer: Medicare Other

## 2013-09-23 ENCOUNTER — Ambulatory Visit (INDEPENDENT_AMBULATORY_CARE_PROVIDER_SITE_OTHER): Payer: Medicare Other | Admitting: Pulmonary Disease

## 2013-09-23 DIAGNOSIS — R52 Pain, unspecified: Secondary | ICD-10-CM | POA: Insufficient documentation

## 2013-09-23 DIAGNOSIS — I509 Heart failure, unspecified: Secondary | ICD-10-CM

## 2013-09-23 LAB — PULMONARY FUNCTION TEST
DL/VA % PRED: 87 %
DL/VA: 3.86 ml/min/mmHg/L
DLCO UNC % PRED: 40 %
DLCO UNC: 11.29 ml/min/mmHg
FEF 25-75 POST: 0.96 L/s
FEF 25-75 Pre: 1.51 L/sec
FEF2575-%Change-Post: -36 %
FEF2575-%PRED-PRE: 50 %
FEF2575-%Pred-Post: 32 %
FEV1-%Change-Post: -8 %
FEV1-%Pred-Post: 41 %
FEV1-%Pred-Pre: 45 %
FEV1-Post: 1.23 L
FEV1-Pre: 1.34 L
FEV1FVC-%Change-Post: 0 %
FEV1FVC-%Pred-Pre: 103 %
FEV6-%CHANGE-POST: -8 %
FEV6-%PRED-PRE: 45 %
FEV6-%Pred-Post: 41 %
FEV6-POST: 1.47 L
FEV6-Pre: 1.61 L
FEV6FVC-%CHANGE-POST: 0 %
FEV6FVC-%PRED-POST: 103 %
FEV6FVC-%Pred-Pre: 102 %
FVC-%Change-Post: -9 %
FVC-%PRED-POST: 40 %
FVC-%PRED-PRE: 44 %
FVC-PRE: 1.62 L
FVC-Post: 1.47 L
POST FEV6/FVC RATIO: 100 %
PRE FEV1/FVC RATIO: 83 %
Post FEV1/FVC ratio: 83 %
Pre FEV6/FVC Ratio: 99 %
RV % pred: 82 %
RV: 1.53 L
TLC % PRED: 55 %
TLC: 3.46 L

## 2013-09-23 NOTE — Progress Notes (Signed)
PFT done today. 

## 2013-09-24 DIAGNOSIS — N186 End stage renal disease: Secondary | ICD-10-CM | POA: Diagnosis not present

## 2013-09-24 DIAGNOSIS — N039 Chronic nephritic syndrome with unspecified morphologic changes: Secondary | ICD-10-CM | POA: Diagnosis not present

## 2013-09-24 DIAGNOSIS — E119 Type 2 diabetes mellitus without complications: Secondary | ICD-10-CM | POA: Diagnosis not present

## 2013-09-24 DIAGNOSIS — N2581 Secondary hyperparathyroidism of renal origin: Secondary | ICD-10-CM | POA: Diagnosis not present

## 2013-09-24 DIAGNOSIS — D631 Anemia in chronic kidney disease: Secondary | ICD-10-CM | POA: Diagnosis not present

## 2013-09-25 ENCOUNTER — Other Ambulatory Visit (HOSPITAL_COMMUNITY): Payer: Medicare Other

## 2013-09-25 DIAGNOSIS — E8779 Other fluid overload: Secondary | ICD-10-CM | POA: Diagnosis not present

## 2013-09-25 DIAGNOSIS — N186 End stage renal disease: Secondary | ICD-10-CM | POA: Diagnosis not present

## 2013-09-26 DIAGNOSIS — N2581 Secondary hyperparathyroidism of renal origin: Secondary | ICD-10-CM | POA: Diagnosis not present

## 2013-09-26 DIAGNOSIS — N186 End stage renal disease: Secondary | ICD-10-CM | POA: Diagnosis not present

## 2013-09-26 DIAGNOSIS — D631 Anemia in chronic kidney disease: Secondary | ICD-10-CM | POA: Diagnosis not present

## 2013-09-26 DIAGNOSIS — E119 Type 2 diabetes mellitus without complications: Secondary | ICD-10-CM | POA: Diagnosis not present

## 2013-09-28 ENCOUNTER — Encounter: Payer: Self-pay | Admitting: Cardiovascular Disease

## 2013-09-28 ENCOUNTER — Encounter: Payer: Self-pay | Admitting: *Deleted

## 2013-09-28 ENCOUNTER — Ambulatory Visit (INDEPENDENT_AMBULATORY_CARE_PROVIDER_SITE_OTHER): Payer: Medicare Other | Admitting: Cardiovascular Disease

## 2013-09-28 ENCOUNTER — Other Ambulatory Visit (HOSPITAL_COMMUNITY): Payer: Medicare Other

## 2013-09-28 VITALS — BP 140/90 | HR 95 | Ht 69.0 in | Wt 191.0 lb

## 2013-09-28 DIAGNOSIS — R072 Precordial pain: Secondary | ICD-10-CM

## 2013-09-28 DIAGNOSIS — Z87891 Personal history of nicotine dependence: Secondary | ICD-10-CM | POA: Diagnosis not present

## 2013-09-28 DIAGNOSIS — I428 Other cardiomyopathies: Secondary | ICD-10-CM | POA: Diagnosis not present

## 2013-09-28 DIAGNOSIS — I340 Nonrheumatic mitral (valve) insufficiency: Secondary | ICD-10-CM

## 2013-09-28 DIAGNOSIS — I059 Rheumatic mitral valve disease, unspecified: Secondary | ICD-10-CM | POA: Diagnosis not present

## 2013-09-28 DIAGNOSIS — F17201 Nicotine dependence, unspecified, in remission: Secondary | ICD-10-CM

## 2013-09-28 DIAGNOSIS — I420 Dilated cardiomyopathy: Secondary | ICD-10-CM

## 2013-09-28 DIAGNOSIS — I10 Essential (primary) hypertension: Secondary | ICD-10-CM

## 2013-09-28 NOTE — Patient Instructions (Addendum)
Your physician recommends that you schedule a follow-up appointment in:  About 6- 8 weeks. Scheduled for November 11, 2013 at 3:00  Keep scheduled appt for echocardiogram on September 30, 2013  Your physician recommends that you return for lab work the afternoon of October 08, 2013 after dialysis    Your physician has requested that you have a cardiac catheterization. Cardiac catheterization is used to diagnose and/or treat various heart conditions. Doctors may recommend this procedure for a number of different reasons. The most common reason is to evaluate chest pain. Chest pain can be a symptom of coronary artery disease (CAD), and cardiac catheterization can show whether plaque is narrowing or blocking your heart's arteries. This procedure is also used to evaluate the valves, as well as measure the blood flow and oxygen levels in different parts of your heart. For further information please visit [email or web address]. Please follow instruction sheet, as given. Scheduled for October 09, 2013  Coronary Angiogram A coronary angiogram, also called coronary angiography, is an X-ray procedure used to look at the arteries in the heart. In this procedure, a dye (contrast dye) is injected through a long, hollow tube (catheter). The catheter is about the size of a piece of cooked spaghetti and is inserted through your groin, wrist, or arm. The dye is injected into each artery, and X-rays are then taken to show if there is a blockage in the arteries of your heart. LET [facility] CARE PROVIDER KNOW ABOUT:  Any allergies you have, including allergies to shellfish or contrast dye.   All medicines you are taking, including vitamins, herbs, eye drops, creams, and over-the-counter medicines.   Previous problems you or members of your family have had with the use of anesthetics.   Any blood disorders you have.   Previous surgeries you have had.  History of kidney problems or failure.   Other medical  conditions you have. RISKS AND COMPLICATIONS  Generally, a coronary angiogram is a safe procedure. However, problems can occur and include:  Allergic reaction to the dye.  Bleeding from the access site or other locations.  Kidney injury, especially in people with impaired kidney function.  Stroke (rare).  Heart attack (rare). BEFORE THE PROCEDURE   Do not eat or drink anything after midnight the night before the procedure or as directed by your health care provider.   Ask your health care provider about changing or stopping your regular medicines. This is especially important if you are taking diabetes medicines or blood thinners. PROCEDURE  You may be given a medicine to help you relax (sedative) before the procedure. This medicine is given through an intravenous (IV) access tube that is inserted into one of your veins.   The area where the catheter will be inserted will be washed and shaved. This is usually done in the groin but may be done in the fold of your arm (near your elbow) or in the wrist.   A medicine will be given to numb the area where the catheter will be inserted (local anesthetic).   The health care provider will insert the catheter into an artery. The catheter will be guided by using a special type of X-ray (fluoroscopy) of the blood vessel being examined.   A special dye will then be injected into the catheter, and X-rays will be taken. The dye will help to show where any narrowing or blockages are located in the heart arteries.  AFTER THE PROCEDURE   If the procedure is done through the  leg, you will be kept in bed lying flat for several hours. You will be instructed to not bend or cross your legs.  The insertion site will be checked frequently.   The pulse in your feet or wrist will be checked frequently.   Additional blood tests, X-rays, and an electrocardiogram may be done.  Document Released: 07/01/2002 Document Revised: 05/11/2013 Document  Reviewed: 05/19/2012 [facility] Patient Information 2015 [place], [place]. This information is not intended to replace advice given to you by your health care provider. Make sure you discuss any questions you have with your health care provider.

## 2013-09-28 NOTE — Progress Notes (Signed)
History of Present Illness: 53 yo male with history of moderate MR, HTN, CVA, DM, COPD, cardiomyopathy, ESRD on HD here today as a new patient to establish cardiac care. He is known to have a non-ischemic cardiomyopathy with LVEF30% by TEE in 2013. Also known to have moderate MR. He has not been seen by cardiology in the past. Failed kidney transplant at [facility]. He has been having issues with SOB. Seen in Pulmonary clinic and referred here for discussion of CHF. Volume management is by dialysis. No prior cardiac cath.   He tells me that he has been having cramping across his chest with dialysis. This is a chest pressure that usually starts after 1-2 hours of dialysis. Also notes dyspnea with exertion and LE edema. Cough for one year.   Primary Care Physician: Lorayne Marek  Past Medical History  Diagnosis Date  . Hypertension   . ESRD     on HD  . Anemia   . Stroke   . Hemiparesis due to old stroke     left  . LV dysfunction     EF 25-30% by echo 07/2011  . MR (mitral regurgitation)     moderate to severe, echo 07/2011  . Shortness of breath   . Diabetes mellitus without complication   . CHF (congestive heart failure)   . COPD (chronic obstructive pulmonary disease)   . Tobacco abuse     Past Surgical History  Procedure Laterality Date  . Kidney transplant      2011 rejected kidney 2012 back on dialysis  . Av fistula placement    . Cardiac catheterization      [place] without cardioversion  08/17/2011    Procedure: TRANSESOPHAGEAL ECHOCARDIOGRAM (TEE);  Surgeon: Larey Dresser, MD;  Location: [place];  Service: Cardiovascular;  Laterality: N/A;  . [place] of arteriovenous fistula Left 08/26/2013    Procedure: EXCISION OF ERODED SKIN AND EXPLORATION OF MAIN LEFT UPPER ARM AV FISTULA;  Surgeon: Rosetta Posner, MD;  Location: [facility] OR;  Service: Vascular;  Laterality: Left;    Current Outpatient Prescriptions  Medication Sig Dispense Refill  . albuterol  (PROVENTIL HFA;VENTOLIN HFA) 108 (90 BASE) MCG/ACT inhaler Inhale 1-2 puffs into the lungs every 6 (six) hours as needed for wheezing.  1 Inhaler  0  . amLODipine (NORVASC) 10 MG tablet Take 10 mg by mouth every evening.      . benzonatate (TESSALON) 100 MG capsule Take 1 capsule (100 mg total) by mouth 3 (three) times daily as needed for cough.  30 capsule  1  . chlorpheniramine-HYDROcodone (TUSSIONEX PENNKINETIC ER) 10-8 MG/5ML LQCR Take 5 mLs by mouth every 12 (twelve) hours as needed for cough.  70 mL  0  . clonazePAM (KLONOPIN) 1 MG tablet Take 1 mg by mouth at bedtime.      . Fluticasone-Salmeterol (ADVAIR DISKUS) 250-50 MCG/DOSE AEPB Inhale 1 puff into the lungs 2 (two) times daily.  60 each  5  . labetalol (NORMODYNE) 300 MG tablet Take 150 mg by mouth 2 (two) times daily.      . predniSONE (DELTASONE) 50 MG tablet Take 1 tablet (50 mg total) by mouth daily.  5 tablet  0  . traMADol (ULTRAM) 50 MG tablet Take 1 tablet (50 mg total) by mouth every 6 (six) hours as needed. Take 1-2 tablets by mouth every 6 hours for pain  20 tablet  0   No current facility-administered medications for this visit.  Allergies  Allergen Reactions  . Codeine Shortness Of Breath    History   Social History  . Marital Status: Single    Spouse Name: N/A    Number of Children: 1  . Years of Education: N/A   Occupational History  . Retired-[employer]    Social History Main Topics  . Smoking status: Former Smoker -- 0.50 packs/day for 32 years    Types: Cigarettes    Quit date: 10/13/2012  . Smokeless tobacco: Never Used  . Alcohol Use: No  . Drug Use: No  . Sexual Activity: Not on file   Other Topics Concern  . Not on file   Social History Narrative  . No narrative on file    Family History  Problem Relation Age of Onset  . Hypertension Mother   . Varicose Veins Mother   . Diabetes Paternal Grandmother   . Cancer Paternal Grandfather   . CAD Paternal Uncle     Review of  Systems:  As stated in the HPI and otherwise negative.   BP 140/90  Pulse 95  Ht 5\' 9"  (1.753 m)  Wt 191 lb (86.637 kg)  BMI 28.19 kg/m2  SpO2 96%  Physical Examination: General: Well developed, well nourished, NAD HEENT: OP clear, mucus membranes moist SKIN: warm, dry. No rashes. Neuro: No focal deficits Musculoskeletal: Muscle strength 5/5 all ext Psychiatric: Mood and affect normal Neck: No JVD, no carotid bruits, no thyromegaly, no lymphadenopathy. Lungs:Clear bilaterally, no wheezes, rhonci, crackles Cardiovascular: Regular rate and rhythm. + systolic murmur. No gallops or rubs. Abdomen:Soft. Bowel sounds present. Non-tender.  Extremities: No lower extremity edema. Pulses are 2 + in the bilateral DP/PT.  Assessment and Plan:   1. Cardiomyopathy: No prior ischemic workup. LVEF 30% in 2013. Echo this week to reassess.   2. Chest pain: Concerning for unstable angina. Risk factors for CAD include ESRD on HD, DM, tobacco abuse, HTN. Will plan right and left heart cath next week 10/09/13 to exclude CAD and assess pressures. Risks and benefits reviewed with pt. Pre-cath labs after HD on 10/08/13.   3. HTN: BP managed by Nephrology  4. DM with complications (ESRD)  5. Tobacco abuse in remission: He stopped smoking 2014.   6. Mitral regurgitation: Moderate  By echo 2013. Repeat echo this week.

## 2013-09-28 NOTE — Addendum Note (Signed)
Addended by: Lauree Chandler D on: 09/28/2013 02:51 PM   Modules accepted: Orders

## 2013-09-29 DIAGNOSIS — N2581 Secondary hyperparathyroidism of renal origin: Secondary | ICD-10-CM | POA: Diagnosis not present

## 2013-09-29 DIAGNOSIS — N039 Chronic nephritic syndrome with unspecified morphologic changes: Secondary | ICD-10-CM | POA: Diagnosis not present

## 2013-09-29 DIAGNOSIS — D631 Anemia in chronic kidney disease: Secondary | ICD-10-CM | POA: Diagnosis not present

## 2013-09-29 DIAGNOSIS — N186 End stage renal disease: Secondary | ICD-10-CM | POA: Diagnosis not present

## 2013-09-29 DIAGNOSIS — E119 Type 2 diabetes mellitus without complications: Secondary | ICD-10-CM | POA: Diagnosis not present

## 2013-09-30 ENCOUNTER — Ambulatory Visit (HOSPITAL_COMMUNITY): Payer: Medicare Other | Attending: Pulmonary Disease | Admitting: Cardiology

## 2013-09-30 DIAGNOSIS — R011 Cardiac murmur, unspecified: Secondary | ICD-10-CM | POA: Diagnosis not present

## 2013-09-30 DIAGNOSIS — E119 Type 2 diabetes mellitus without complications: Secondary | ICD-10-CM | POA: Diagnosis not present

## 2013-09-30 DIAGNOSIS — I10 Essential (primary) hypertension: Secondary | ICD-10-CM | POA: Insufficient documentation

## 2013-09-30 DIAGNOSIS — F17200 Nicotine dependence, unspecified, uncomplicated: Secondary | ICD-10-CM | POA: Insufficient documentation

## 2013-09-30 NOTE — Progress Notes (Signed)
Echo performed. 

## 2013-10-01 DIAGNOSIS — D631 Anemia in chronic kidney disease: Secondary | ICD-10-CM | POA: Diagnosis not present

## 2013-10-01 DIAGNOSIS — N186 End stage renal disease: Secondary | ICD-10-CM | POA: Diagnosis not present

## 2013-10-01 DIAGNOSIS — N2581 Secondary hyperparathyroidism of renal origin: Secondary | ICD-10-CM | POA: Diagnosis not present

## 2013-10-01 DIAGNOSIS — E119 Type 2 diabetes mellitus without complications: Secondary | ICD-10-CM | POA: Diagnosis not present

## 2013-10-02 ENCOUNTER — Telehealth: Payer: Self-pay | Admitting: Cardiology

## 2013-10-02 DIAGNOSIS — N186 End stage renal disease: Secondary | ICD-10-CM | POA: Diagnosis not present

## 2013-10-02 DIAGNOSIS — E8779 Other fluid overload: Secondary | ICD-10-CM | POA: Diagnosis not present

## 2013-10-02 NOTE — Telephone Encounter (Signed)
Closed encounters °

## 2013-10-03 ENCOUNTER — Encounter: Payer: Self-pay | Admitting: Pulmonary Disease

## 2013-10-03 DIAGNOSIS — N2581 Secondary hyperparathyroidism of renal origin: Secondary | ICD-10-CM | POA: Diagnosis not present

## 2013-10-03 DIAGNOSIS — N186 End stage renal disease: Secondary | ICD-10-CM | POA: Diagnosis not present

## 2013-10-03 DIAGNOSIS — E119 Type 2 diabetes mellitus without complications: Secondary | ICD-10-CM | POA: Diagnosis not present

## 2013-10-03 DIAGNOSIS — D631 Anemia in chronic kidney disease: Secondary | ICD-10-CM | POA: Diagnosis not present

## 2013-10-05 ENCOUNTER — Telehealth: Payer: Self-pay

## 2013-10-05 DIAGNOSIS — R0602 Shortness of breath: Secondary | ICD-10-CM

## 2013-10-05 NOTE — Telephone Encounter (Signed)
Spoke with pt, he is aware of results and recs.  Order placed for CT.  Nothing further needed.

## 2013-10-05 NOTE — Telephone Encounter (Signed)
Message copied by Len Blalock on Mon Oct 05, 2013 12:04 PM ------      Message from: Nathaniel White      Created: Sat Oct 03, 2013  3:26 AM       A,            Please let him know that his PFT showed severe restriction and he needs a CT chest (high resolution ILD protocol, Entrkin or Bleitz to read) for further evaluation of his shortness of breath.            Thanks      B ------

## 2013-10-06 ENCOUNTER — Non-Acute Institutional Stay (HOSPITAL_COMMUNITY)
Admission: EM | Admit: 2013-10-06 | Discharge: 2013-10-09 | Disposition: A | Discharge status: Home / Self Care | Payer: Medicare Other | Source: Home / Self Care | Attending: Emergency Medicine | Admitting: Emergency Medicine

## 2013-10-06 ENCOUNTER — Emergency Department (HOSPITAL_BASED_OUTPATIENT_CLINIC_OR_DEPARTMENT_OTHER): Payer: Medicare Other

## 2013-10-06 ENCOUNTER — Encounter (HOSPITAL_BASED_OUTPATIENT_CLINIC_OR_DEPARTMENT_OTHER): Payer: Self-pay | Admitting: Emergency Medicine

## 2013-10-06 DIAGNOSIS — R197 Diarrhea, unspecified: Secondary | ICD-10-CM

## 2013-10-06 DIAGNOSIS — R0602 Shortness of breath: Secondary | ICD-10-CM | POA: Diagnosis not present

## 2013-10-06 DIAGNOSIS — I120 Hypertensive chronic kidney disease with stage 5 chronic kidney disease or end stage renal disease: Secondary | ICD-10-CM | POA: Diagnosis not present

## 2013-10-06 DIAGNOSIS — Z79899 Other long term (current) drug therapy: Secondary | ICD-10-CM | POA: Diagnosis not present

## 2013-10-06 DIAGNOSIS — R531 Weakness: Secondary | ICD-10-CM

## 2013-10-06 DIAGNOSIS — Z940 Kidney transplant status: Secondary | ICD-10-CM | POA: Diagnosis not present

## 2013-10-06 DIAGNOSIS — N186 End stage renal disease: Secondary | ICD-10-CM | POA: Diagnosis not present

## 2013-10-06 DIAGNOSIS — G8190 Hemiplegia, unspecified affecting unspecified side: Secondary | ICD-10-CM | POA: Diagnosis not present

## 2013-10-06 DIAGNOSIS — IMO0002 Reserved for concepts with insufficient information to code with codable children: Secondary | ICD-10-CM | POA: Diagnosis not present

## 2013-10-06 DIAGNOSIS — Z87891 Personal history of nicotine dependence: Secondary | ICD-10-CM | POA: Diagnosis not present

## 2013-10-06 DIAGNOSIS — Z992 Dependence on renal dialysis: Secondary | ICD-10-CM | POA: Diagnosis not present

## 2013-10-06 DIAGNOSIS — J4489 Other specified chronic obstructive pulmonary disease: Secondary | ICD-10-CM | POA: Diagnosis not present

## 2013-10-06 DIAGNOSIS — R110 Nausea: Secondary | ICD-10-CM | POA: Diagnosis not present

## 2013-10-06 DIAGNOSIS — I428 Other cardiomyopathies: Secondary | ICD-10-CM | POA: Diagnosis not present

## 2013-10-06 DIAGNOSIS — R072 Precordial pain: Secondary | ICD-10-CM | POA: Diagnosis not present

## 2013-10-06 DIAGNOSIS — E875 Hyperkalemia: Secondary | ICD-10-CM | POA: Diagnosis present

## 2013-10-06 DIAGNOSIS — E119 Type 2 diabetes mellitus without complications: Secondary | ICD-10-CM | POA: Diagnosis not present

## 2013-10-06 DIAGNOSIS — I059 Rheumatic mitral valve disease, unspecified: Secondary | ICD-10-CM | POA: Diagnosis not present

## 2013-10-06 DIAGNOSIS — J449 Chronic obstructive pulmonary disease, unspecified: Secondary | ICD-10-CM | POA: Diagnosis not present

## 2013-10-06 LAB — COMPREHENSIVE METABOLIC PANEL
ALK PHOS: 135 U/L — AB (ref 39–117)
ALT: 22 U/L (ref 0–53)
AST: 39 U/L — AB (ref 0–37)
Albumin: 3.3 g/dL — ABNORMAL LOW (ref 3.5–5.2)
Anion gap: 25 — ABNORMAL HIGH (ref 5–15)
BUN: 98 mg/dL — ABNORMAL HIGH (ref 6–23)
CALCIUM: 9.6 mg/dL (ref 8.4–10.5)
CO2: 21 mEq/L (ref 19–32)
Chloride: 93 mEq/L — ABNORMAL LOW (ref 96–112)
Creatinine, Ser: 13.6 mg/dL — ABNORMAL HIGH (ref 0.50–1.35)
GFR calc Af Amer: 4 mL/min — ABNORMAL LOW (ref >90)
GFR calc non Af Amer: 4 mL/min — ABNORMAL LOW (ref >90)
GLUCOSE: 100 mg/dL — AB (ref 70–99)
POTASSIUM: 7 meq/L — AB (ref 3.7–5.3)
SODIUM: 139 meq/L (ref 137–147)
TOTAL PROTEIN: 7.3 g/dL (ref 6.0–8.3)
Total Bilirubin: 0.5 mg/dL (ref 0.3–1.2)

## 2013-10-06 LAB — CBC WITH DIFFERENTIAL/PLATELET
BASOS ABS: 0.1 10*3/uL (ref 0.0–0.1)
BASOS PCT: 1 % (ref 0–1)
Eosinophils Absolute: 0.2 10*3/uL (ref 0.0–0.7)
Eosinophils Relative: 5 % (ref 0–5)
HCT: 34.9 % — ABNORMAL LOW (ref 39.0–52.0)
Hemoglobin: 11.4 g/dL — ABNORMAL LOW (ref 13.0–17.0)
LYMPHS PCT: 15 % (ref 12–46)
Lymphs Abs: 0.8 10*3/uL (ref 0.7–4.0)
MCH: 29.7 pg (ref 26.0–34.0)
MCHC: 32.7 g/dL (ref 30.0–36.0)
MCV: 90.9 fL (ref 78.0–100.0)
Monocytes Absolute: 0.5 10*3/uL (ref 0.1–1.0)
Monocytes Relative: 10 % (ref 3–12)
Neutro Abs: 3.5 10*3/uL (ref 1.7–7.7)
Neutrophils Relative %: 69 % (ref 43–77)
PLATELETS: 105 10*3/uL — AB (ref 150–400)
RBC: 3.84 MIL/uL — ABNORMAL LOW (ref 4.22–5.81)
RDW: 17.7 % — ABNORMAL HIGH (ref 11.5–15.5)
WBC: 5 10*3/uL (ref 4.0–10.5)

## 2013-10-06 LAB — CBG MONITORING, ED
GLUCOSE-CAPILLARY: 40 mg/dL — AB (ref 70–99)
Glucose-Capillary: 81 mg/dL (ref 70–99)
Glucose-Capillary: 63 mg/dL — ABNORMAL LOW (ref 70–99)
Glucose-Capillary: 115 mg/dL — ABNORMAL HIGH (ref 70–99)

## 2013-10-06 LAB — I-STAT CHEM 8, ED
BUN: 107 mg/dL — AB (ref 6–23)
CHLORIDE: 104 meq/L (ref 96–112)
Calcium, Ion: 1.07 mmol/L — ABNORMAL LOW (ref 1.12–1.23)
Creatinine, Ser: 13.4 mg/dL — ABNORMAL HIGH (ref 0.50–1.35)
Glucose, Bld: 48 mg/dL — ABNORMAL LOW (ref 70–99)
HEMATOCRIT: 37 % — AB (ref 39.0–52.0)
Hemoglobin: 12.6 g/dL — ABNORMAL LOW (ref 13.0–17.0)
POTASSIUM: 5.7 meq/L — AB (ref 3.7–5.3)
Sodium: 135 mEq/L — ABNORMAL LOW (ref 137–147)
TCO2: 20 mmol/L (ref 0–100)

## 2013-10-06 LAB — I-STAT TROPONIN, ED: Troponin i, poc: 0.11 ng/mL (ref 0.00–0.08)

## 2013-10-06 LAB — OCCULT BLOOD X 1 CARD TO LAB, STOOL: FECAL OCCULT BLD: NEGATIVE

## 2013-10-06 LAB — TROPONIN I

## 2013-10-06 LAB — PRO B NATRIURETIC PEPTIDE: Pro B Natriuretic peptide (BNP): >70000 pg/mL — ABNORMAL HIGH (ref 0–125)

## 2013-10-06 MED ORDER — INSULIN ASPART 100 UNIT/ML ~~LOC~~ SOLN
SUBCUTANEOUS | Status: AC
  Filled 2013-10-06: qty 1

## 2013-10-06 MED ORDER — SODIUM CHLORIDE 0.9 % IV SOLN
1.0000 g | Freq: Once | INTRAVENOUS | Status: AC
  Administered 2013-10-06: 1 g via INTRAVENOUS
  Filled 2013-10-06: qty 10

## 2013-10-06 MED ORDER — DEXTROSE 50 % IV SOLN
50.0000 mL | Freq: Once | INTRAVENOUS | Status: AC
  Administered 2013-10-06: 50 mL via INTRAVENOUS
  Filled 2013-10-06: qty 50

## 2013-10-06 MED ORDER — INSULIN ASPART 100 UNIT/ML IV SOLN
10.0000 [IU] | Freq: Once | INTRAVENOUS | Status: AC
  Administered 2013-10-06: 10 [IU] via INTRAVENOUS
  Filled 2013-10-06: qty 0.1

## 2013-10-06 MED ORDER — DEXTROSE 50 % IV SOLN
1.0000 | Freq: Once | INTRAVENOUS | Status: AC
  Administered 2013-10-06: 50 mL via INTRAVENOUS
  Filled 2013-10-06: qty 50

## 2013-10-06 MED ORDER — ONDANSETRON HCL 4 MG/2ML IJ SOLN
4.0000 mg | Freq: Once | INTRAMUSCULAR | Status: AC
  Administered 2013-10-06: 4 mg via INTRAVENOUS
  Filled 2013-10-06: qty 2

## 2013-10-06 NOTE — ED Notes (Signed)
ekg completed and given to dr. Sharol Given

## 2013-10-06 NOTE — ED Notes (Signed)
Dr. Sharol Given at the bedside. Reported repeat CBG to Dr. Sharol Given of 81, and troponin of 0.11. No new orders at this time.

## 2013-10-06 NOTE — Progress Notes (Signed)
Patient discharged home after hemodialysis. No verbalized concerns. Dr Posey Pronto ordered discharge. All  Vital signs are normal for patient. Blood pressure 159/98, HR 104, O2 sat of 100%. Patient family member is here to to transfer patient home.

## 2013-10-06 NOTE — ED Notes (Signed)
Repeat cbg of 81 post dextrose

## 2013-10-06 NOTE — ED Notes (Signed)
Reported cgb of 40 to Dr. Sharol Given. Will administer dextrose

## 2013-10-06 NOTE — ED Provider Notes (Addendum)
Pt arrives at this receiving facility, from one [facility] ED to another [facility] ED, as a transfer due to service availability.  Pt seen at [facility] due to abdominal pain, found to have hyperkalemia.  Has received calcium, insulin, dextrose.  Pt arrives here via [facility].  He is agitated, complaining of not feeling well, short of breath, wanting to sit up on side of bed, nauseated.  EKG repeated, shows increased hyperacute t waves from prior.  Pt is diaphoretic.  Will recheck istat 8 and istat troponin and cbg.   CBG 40.  Will give amp d50.  Pt to have emergent dialysis.  Kalman Drape, MD 10/06/13 0443   EKG Interpretation  Date/Time:  Tuesday October 06 2013 04:32:41 EDT Ventricular Rate:  109 PR Interval:  177 QRS Duration: 123 QT Interval:  351 QTC Calculation: 473 R Axis:   -45 Text Interpretation:  Sinus tachycardia Nonspecific IVCD with LAD LVH with secondary repolarization abnormality Baseline wander in lead(s) V5 hyperacute twaves since last ekg this am lateral ST depression worsening from prior Confirmed by Voncille Simm  MD, Witten Certain ([id number]) on 10/06/2013 4:39:46 AM        Kalman Drape, MD 10/06/13 [phone]

## 2013-10-06 NOTE — ED Notes (Signed)
Report given to [facility]. 

## 2013-10-06 NOTE — ED Notes (Signed)
Now admits to having fried chicken and french fries yesterday

## 2013-10-06 NOTE — ED Notes (Signed)
Reported CBG of 63 to Dr. Sharol Given, will administer dextrose.

## 2013-10-06 NOTE — ED Notes (Signed)
abd pain with diarrhea.  States ate chicken that didn't taste right

## 2013-10-06 NOTE — ED Provider Notes (Signed)
CSN: [id number]     Arrival date & time 10/06/13  0043 History   First MD Initiated Contact with Patient 10/06/13 0054     Chief Complaint  Patient presents with  . Abdominal Pain     (Consider location/radiation/quality/duration/timing/severity/associated sxs/prior Treatment) HPI Patient with a history of COPD/CHF/diabetes/renal failure on hemodialysis presents with multiple episodes of loose stools starting yesterday evening. Patient states the loose stools started after taking milk of magnesia for constipation. He's had some mild abdominal cramping. Patient states she has chronic shortness of breath and this is pretty much unchanged. His anemia cough. Denies any nausea or vomiting. Patient has had some gross blood in his stool. Patient is on a Tuesday Thursday Saturday dialysis schedule. Last dialyzed on Saturday. Past Medical History  Diagnosis Date  . Hypertension   . ESRD     on HD  . Anemia   . Stroke   . Hemiparesis due to old stroke     left  . LV dysfunction     EF 25-30% by echo 07/2011  . MR (mitral regurgitation)     moderate to severe, echo 07/2011  . Shortness of breath   . Diabetes mellitus without complication   . CHF (congestive heart failure)   . COPD (chronic obstructive pulmonary disease)   . Tobacco abuse    Past Surgical History  Procedure Laterality Date  . Kidney transplant      2011 rejected kidney 2012 back on dialysis  . Av fistula placement    . Cardiac catheterization      [place] without cardioversion  08/17/2011    Procedure: TRANSESOPHAGEAL ECHOCARDIOGRAM (TEE);  Surgeon: Larey Dresser, MD;  Location: [place];  Service: Cardiovascular;  Laterality: N/A;  . Revison of arteriovenous fistula Left 08/26/2013    Procedure: EXCISION OF ERODED SKIN AND EXPLORATION OF MAIN LEFT UPPER ARM AV FISTULA;  Surgeon: Rosetta Posner, MD;  Location: [facility] OR;  Service: Vascular;  Laterality: Left;   Family History  Problem Relation Age of Onset   . Hypertension Mother   . Varicose Veins Mother   . Diabetes Paternal Grandmother   . Cancer Paternal Grandfather   . CAD Paternal Uncle    History  Substance Use Topics  . Smoking status: Former Smoker -- 0.50 packs/day for 32 years    Types: Cigarettes    Quit date: 10/13/2012  . Smokeless tobacco: Never Used  . Alcohol Use: No    Review of Systems  Constitutional: Positive for fatigue. Negative for fever and chills.  Respiratory: Positive for shortness of breath. Negative for cough.   Cardiovascular: Negative for chest pain, palpitations and leg swelling.  Gastrointestinal: Positive for abdominal pain, diarrhea and blood in stool. Negative for nausea and vomiting.  Musculoskeletal: Negative for back pain, myalgias, neck pain and neck stiffness.  Skin: Negative for rash and wound.  Neurological: Positive for weakness (generalized). Negative for dizziness, light-headedness, numbness and headaches.  All other systems reviewed and are negative.     Allergies  Codeine  Home Medications   Prior to Admission medications   Medication Sig Start Date End Date Taking? Authorizing Provider  albuterol (PROVENTIL HFA;VENTOLIN HFA) 108 (90 BASE) MCG/ACT inhaler Inhale 1-2 puffs into the lungs every 6 (six) hours as needed for wheezing. 07/01/12   April K Palumbo-Rasch, MD  amLODipine (NORVASC) 10 MG tablet Take 10 mg by mouth every evening.    Historical Provider, MD  benzonatate (TESSALON) 100 MG capsule Take  1 capsule (100 mg total) by mouth 3 (three) times daily as needed for cough. 08/14/13   Lorayne Marek, MD  chlorpheniramine-HYDROcodone (TUSSIONEX PENNKINETIC ER) 10-8 MG/5ML LQCR Take 5 mLs by mouth every 12 (twelve) hours as needed for cough. 07/20/13   Shari A Upstill, PA-C  clonazePAM (KLONOPIN) 1 MG tablet Take 1 mg by mouth at bedtime.    Historical Provider, MD  Fluticasone-Salmeterol (ADVAIR DISKUS) 250-50 MCG/DOSE AEPB Inhale 1 puff into the lungs 2 (two) times daily. 09/07/13    Juanito Doom, MD  labetalol (NORMODYNE) 300 MG tablet Take 150 mg by mouth 2 (two) times daily.    Historical Provider, MD  predniSONE (DELTASONE) 50 MG tablet Take 1 tablet (50 mg total) by mouth daily. [id number]   [name], MD  traMADol (ULTRAM) 50 MG tablet Take 1 tablet (50 mg total) by mouth every 6 (six) hours as needed. Take 1-2 tablets by mouth every 6 hours for pain 08/26/13   Joelene Millin A Trinh, PA-C   BP 152/111  Pulse 94  Temp(Src) 98.2 F (36.8 C) (Oral)  Resp 20  Ht 5\' 9"  (1.753 m)  Wt 180 lb (81.647 kg)  BMI 26.57 kg/m2  SpO2 98% Physical Exam  Nursing note and vitals reviewed. Constitutional: He is oriented to person, place, and time. He appears well-developed and well-nourished. No distress.  HENT:  Head: Normocephalic and atraumatic.  Mouth/Throat: Oropharynx is clear and moist.  Eyes: EOM are normal. Pupils are equal, round, and reactive to light.  Neck: Normal range of motion. Neck supple.  Cardiovascular: Normal rate and regular rhythm.   Pulmonary/Chest: Effort normal and breath sounds normal. No respiratory distress. He has no wheezes. He has no rales. He exhibits no tenderness.  Abdominal: Soft. Bowel sounds are normal. He exhibits no distension and no mass. There is no tenderness. There is no rebound and no guarding.  Musculoskeletal: Normal range of motion. He exhibits no edema and no tenderness.  Fistula left upper extremity. Palpable thrill. No evidence of infection.  Neurological: He is alert and oriented to person, place, and time.  Skin: Skin is warm and dry. No rash noted. No erythema.  Psychiatric: He has a normal mood and affect. His behavior is normal.    ED Course  Procedures (including critical care time) Labs Review Labs Reviewed  CBC WITH DIFFERENTIAL  COMPREHENSIVE METABOLIC PANEL  PRO B NATRIURETIC PEPTIDE  TROPONIN I    Imaging Review No results found.   EKG Interpretation None      Date: 10/06/2013  Rate: 98  Rhythm:  normal sinus rhythm  QRS Axis: normal  Intervals: QRS mildly prolonged  ST/T Wave abnormalities: nonspecific T wave changes  Conduction Disutrbances:nonspecific intraventricular conduction delay  Narrative Interpretation:   Old EKG Reviewed: changes noted Slight prolongation of QRS compared to old EKG.  MDM   Final diagnoses:  None    Discussed with Dr. Clover Mealy. Recommends holding Kayexalate. Also recommends transferring to the emergency department and having her paged when the patient arrives to receive emergent dialysis.  Discussed with Dr. Reather Converse. Will accept in transfer to the emergency department at [facility] [place], MD 10/06/13 [id number]

## 2013-10-06 NOTE — ED Notes (Signed)
Dr. Otter at the bedside.  

## 2013-10-06 NOTE — ED Notes (Signed)
cbg recheck after dextrose is 115. Will transport to dialysis

## 2013-10-06 NOTE — ED Notes (Signed)
I stat troponin results given to Dr. Sharol Given

## 2013-10-06 NOTE — ED Notes (Signed)
Attempted to call dialysis 

## 2013-10-07 DIAGNOSIS — N186 End stage renal disease: Secondary | ICD-10-CM | POA: Diagnosis not present

## 2013-10-08 ENCOUNTER — Other Ambulatory Visit (INDEPENDENT_AMBULATORY_CARE_PROVIDER_SITE_OTHER): Payer: Medicare Other | Admitting: *Deleted

## 2013-10-08 ENCOUNTER — Non-Acute Institutional Stay (INDEPENDENT_AMBULATORY_CARE_PROVIDER_SITE_OTHER)
Admission: RE | Admit: 2013-10-08 | Discharge: 2013-10-08 | Disposition: A | Discharge status: Home / Self Care | Payer: Medicare Other | Source: Ambulatory Visit | Attending: Pulmonary Disease | Admitting: Pulmonary Disease

## 2013-10-08 DIAGNOSIS — E119 Type 2 diabetes mellitus without complications: Secondary | ICD-10-CM | POA: Diagnosis not present

## 2013-10-08 DIAGNOSIS — I288 Other diseases of pulmonary vessels: Secondary | ICD-10-CM | POA: Diagnosis not present

## 2013-10-08 DIAGNOSIS — N186 End stage renal disease: Secondary | ICD-10-CM | POA: Diagnosis not present

## 2013-10-08 DIAGNOSIS — J449 Chronic obstructive pulmonary disease, unspecified: Secondary | ICD-10-CM | POA: Diagnosis not present

## 2013-10-08 DIAGNOSIS — I420 Dilated cardiomyopathy: Secondary | ICD-10-CM | POA: Diagnosis not present

## 2013-10-08 DIAGNOSIS — Z79891 Long term (current) use of opiate analgesic: Secondary | ICD-10-CM | POA: Diagnosis not present

## 2013-10-08 DIAGNOSIS — R072 Precordial pain: Secondary | ICD-10-CM

## 2013-10-08 DIAGNOSIS — J45909 Unspecified asthma, uncomplicated: Secondary | ICD-10-CM | POA: Diagnosis not present

## 2013-10-08 DIAGNOSIS — I429 Cardiomyopathy, unspecified: Secondary | ICD-10-CM | POA: Diagnosis not present

## 2013-10-08 DIAGNOSIS — I69354 Hemiplegia and hemiparesis following cerebral infarction affecting left non-dominant side: Secondary | ICD-10-CM | POA: Diagnosis not present

## 2013-10-08 DIAGNOSIS — Z7952 Long term (current) use of systemic steroids: Secondary | ICD-10-CM | POA: Diagnosis not present

## 2013-10-08 DIAGNOSIS — N2581 Secondary hyperparathyroidism of renal origin: Secondary | ICD-10-CM | POA: Diagnosis not present

## 2013-10-08 DIAGNOSIS — I340 Nonrheumatic mitral (valve) insufficiency: Secondary | ICD-10-CM | POA: Diagnosis not present

## 2013-10-08 DIAGNOSIS — I2510 Atherosclerotic heart disease of native coronary artery without angina pectoris: Secondary | ICD-10-CM | POA: Diagnosis not present

## 2013-10-08 DIAGNOSIS — R0602 Shortness of breath: Secondary | ICD-10-CM

## 2013-10-08 DIAGNOSIS — Z992 Dependence on renal dialysis: Secondary | ICD-10-CM | POA: Diagnosis not present

## 2013-10-08 DIAGNOSIS — J984 Other disorders of lung: Secondary | ICD-10-CM | POA: Diagnosis not present

## 2013-10-08 DIAGNOSIS — Z23 Encounter for immunization: Secondary | ICD-10-CM | POA: Diagnosis not present

## 2013-10-08 DIAGNOSIS — T8612 Kidney transplant failure: Secondary | ICD-10-CM | POA: Diagnosis not present

## 2013-10-08 DIAGNOSIS — D631 Anemia in chronic kidney disease: Secondary | ICD-10-CM | POA: Diagnosis not present

## 2013-10-08 DIAGNOSIS — I120 Hypertensive chronic kidney disease with stage 5 chronic kidney disease or end stage renal disease: Secondary | ICD-10-CM | POA: Diagnosis not present

## 2013-10-08 DIAGNOSIS — Z87891 Personal history of nicotine dependence: Secondary | ICD-10-CM | POA: Diagnosis not present

## 2013-10-08 LAB — BASIC METABOLIC PANEL
BUN: 27 mg/dL — ABNORMAL HIGH (ref 6–23)
CO2: 30 mEq/L (ref 19–32)
Calcium: 8.4 mg/dL (ref 8.4–10.5)
Chloride: 98 mEq/L (ref 96–112)
Creatinine, Ser: 5.9 mg/dL (ref 0.4–1.5)
GFR: 13.08 mL/min — AB (ref >60.00)
GLUCOSE: 104 mg/dL — AB (ref 70–99)
POTASSIUM: 4.3 meq/L (ref 3.5–5.1)
SODIUM: 136 meq/L (ref 135–145)

## 2013-10-08 LAB — CBC
HCT: 32.9 % — ABNORMAL LOW (ref 39.0–52.0)
Hemoglobin: 10.6 g/dL — ABNORMAL LOW (ref 13.0–17.0)
MCHC: 32.1 g/dL (ref 30.0–36.0)
MCV: 92.5 fl (ref 78.0–100.0)
Platelets: 93 10*3/uL — ABNORMAL LOW (ref 150.0–400.0)
RBC: 3.55 Mil/uL — AB (ref 4.22–5.81)
RDW: 18.6 % — ABNORMAL HIGH (ref 11.5–15.5)
WBC: 3.3 10*3/uL — AB (ref 4.0–10.5)

## 2013-10-08 LAB — PROTIME-INR
INR: 1 ratio (ref 0.8–1.0)
PROTHROMBIN TIME: 11.6 s (ref 9.6–13.1)

## 2013-10-09 ENCOUNTER — Encounter: Payer: Self-pay | Admitting: Pulmonary Disease

## 2013-10-09 ENCOUNTER — Ambulatory Visit (HOSPITAL_COMMUNITY): Admission: RE | Admit: 2013-10-09 | Payer: Medicare Other | Source: Ambulatory Visit | Admitting: Cardiovascular Disease

## 2013-10-09 ENCOUNTER — Encounter (HOSPITAL_COMMUNITY)
Admission: EM | Disposition: A | Discharge status: Home / Self Care | Payer: Self-pay | Source: Home / Self Care | Attending: Emergency Medicine

## 2013-10-09 ENCOUNTER — Ambulatory Visit (HOSPITAL_COMMUNITY)
Admission: AD | Admit: 2013-10-09 | Discharge: 2013-10-09 | Disposition: A | Discharge status: Home / Self Care | Payer: Medicare Other | Source: Ambulatory Visit | Attending: Cardiovascular Disease | Admitting: Cardiovascular Disease

## 2013-10-09 ENCOUNTER — Encounter (HOSPITAL_COMMUNITY)
Admission: AD | Disposition: A | Discharge status: Home / Self Care | Payer: Self-pay | Source: Ambulatory Visit | Attending: Cardiovascular Disease

## 2013-10-09 DIAGNOSIS — Z79891 Long term (current) use of opiate analgesic: Secondary | ICD-10-CM | POA: Insufficient documentation

## 2013-10-09 DIAGNOSIS — J45909 Unspecified asthma, uncomplicated: Secondary | ICD-10-CM | POA: Insufficient documentation

## 2013-10-09 DIAGNOSIS — I429 Cardiomyopathy, unspecified: Secondary | ICD-10-CM | POA: Insufficient documentation

## 2013-10-09 DIAGNOSIS — I420 Dilated cardiomyopathy: Secondary | ICD-10-CM | POA: Diagnosis not present

## 2013-10-09 DIAGNOSIS — Z992 Dependence on renal dialysis: Secondary | ICD-10-CM | POA: Insufficient documentation

## 2013-10-09 DIAGNOSIS — Z7952 Long term (current) use of systemic steroids: Secondary | ICD-10-CM | POA: Insufficient documentation

## 2013-10-09 DIAGNOSIS — N186 End stage renal disease: Secondary | ICD-10-CM | POA: Insufficient documentation

## 2013-10-09 DIAGNOSIS — T8612 Kidney transplant failure: Secondary | ICD-10-CM | POA: Insufficient documentation

## 2013-10-09 DIAGNOSIS — R0602 Shortness of breath: Secondary | ICD-10-CM

## 2013-10-09 DIAGNOSIS — I120 Hypertensive chronic kidney disease with stage 5 chronic kidney disease or end stage renal disease: Secondary | ICD-10-CM | POA: Insufficient documentation

## 2013-10-09 DIAGNOSIS — Z87891 Personal history of nicotine dependence: Secondary | ICD-10-CM | POA: Insufficient documentation

## 2013-10-09 DIAGNOSIS — I69354 Hemiplegia and hemiparesis following cerebral infarction affecting left non-dominant side: Secondary | ICD-10-CM | POA: Insufficient documentation

## 2013-10-09 DIAGNOSIS — R072 Precordial pain: Secondary | ICD-10-CM | POA: Insufficient documentation

## 2013-10-09 DIAGNOSIS — J449 Chronic obstructive pulmonary disease, unspecified: Secondary | ICD-10-CM | POA: Insufficient documentation

## 2013-10-09 DIAGNOSIS — E119 Type 2 diabetes mellitus without complications: Secondary | ICD-10-CM | POA: Insufficient documentation

## 2013-10-09 DIAGNOSIS — I340 Nonrheumatic mitral (valve) insufficiency: Secondary | ICD-10-CM | POA: Insufficient documentation

## 2013-10-09 HISTORY — PX: LEFT AND RIGHT HEART CATHETERIZATION WITH CORONARY ANGIOGRAM: SHX5449

## 2013-10-09 LAB — POCT I-STAT 3, VENOUS BLOOD GAS (G3P V)
ACID-BASE EXCESS: 4 mmol/L — AB (ref 0.0–2.0)
Bicarbonate: 29 mEq/L — ABNORMAL HIGH (ref 20.0–24.0)
O2 SAT: 65 %
TCO2: 30 mmol/L (ref 0–100)
pCO2, Ven: 44 mmHg — ABNORMAL LOW (ref 45.0–50.0)
pH, Ven: 7.427 — ABNORMAL HIGH (ref 7.250–7.300)
pO2, Ven: 33 mmHg (ref 30.0–45.0)

## 2013-10-09 LAB — POCT I-STAT 3, ART BLOOD GAS (G3+)
Acid-Base Excess: 3 mmol/L — ABNORMAL HIGH (ref 0.0–2.0)
Bicarbonate: 27.2 mEq/L — ABNORMAL HIGH (ref 20.0–24.0)
O2 SAT: 92 %
PCO2 ART: 38.2 mmHg (ref 35.0–45.0)
PO2 ART: 60 mmHg — AB (ref 80.0–100.0)
TCO2: 28 mmol/L (ref 0–100)
pH, Arterial: 7.462 — ABNORMAL HIGH (ref 7.350–7.450)

## 2013-10-09 LAB — GLUCOSE, CAPILLARY: Glucose-Capillary: 80 mg/dL (ref 70–99)

## 2013-10-09 SURGERY — LEFT AND RIGHT HEART CATHETERIZATION WITH CORONARY ANGIOGRAM
Anesthesia: LOCAL

## 2013-10-09 MED ORDER — SODIUM CHLORIDE 0.9 % IJ SOLN: 3.0000 mL | INTRAMUSCULAR | Status: DC | PRN

## 2013-10-09 MED ORDER — SODIUM CHLORIDE 0.9 % IV SOLN: 250.0000 mL | INTRAVENOUS | Status: DC | PRN

## 2013-10-09 MED ORDER — GUAIFENESIN 100 MG/5ML PO SOLN
10.0000 mL | ORAL | Status: DC | PRN
  Administered 2013-10-09: 200 mg via ORAL
  Filled 2013-10-09: qty 10

## 2013-10-09 MED ORDER — DIAZEPAM 5 MG/ML IJ SOLN
5.0000 mg | Freq: Once | INTRAMUSCULAR | Status: AC
  Administered 2013-10-09: 5 mg via INTRAVENOUS

## 2013-10-09 MED ORDER — SODIUM CHLORIDE 0.9 % IJ SOLN: 3.0000 mL | Freq: Two times a day (BID) | INTRAMUSCULAR | Status: DC

## 2013-10-09 MED ORDER — ASPIRIN 81 MG PO CHEW
81.0000 mg | CHEWABLE_TABLET | ORAL | Status: AC
  Administered 2013-10-09: 81 mg via ORAL

## 2013-10-09 MED ORDER — HYDRALAZINE HCL 20 MG/ML IJ SOLN
10.0000 mg | Freq: Once | INTRAMUSCULAR | Status: AC
  Administered 2013-10-09: 10 mg via INTRAVENOUS

## 2013-10-09 NOTE — Interval H&P Note (Signed)
History and Physical Interval Note:  10/09/2013 11:16 AM  Nathaniel White  has presented today for cardiac cath with the diagnosis of chest pain/sob/cardiomyopathy  The various methods of treatment have been discussed with the patient and family. After consideration of risks, benefits and other options for treatment, the patient has consented to  Procedure(s): LEFT AND RIGHT HEART CATHETERIZATION WITH CORONARY ANGIOGRAM (N/A) as a surgical intervention .  The patient's history has been reviewed, patient examined, no change in status, stable for surgery.  I have reviewed the patient's chart and labs.  Questions were answered to the patient's satisfaction.    Cath Lab Visit (complete for each Cath Lab visit)  Clinical Evaluation Leading to the Procedure:   ACS: No.  Non-ACS:    Anginal Classification: CCS III  Anti-ischemic medical therapy: Maximal Therapy (2 or more classes of medications)  Non-Invasive Test Results: No non-invasive testing performed  Prior CABG: No previous CABG        Jru Pense

## 2013-10-09 NOTE — Discharge Instructions (Signed)
Angiogram, Care After °Refer to this sheet in the next few weeks. These instructions provide you with information on caring for yourself after your procedure. Your health care provider may also give you more specific instructions. Your treatment has been planned according to current medical practices, but problems sometimes occur. Call your health care provider if you have any problems or questions after your procedure.  °WHAT TO EXPECT AFTER THE PROCEDURE °After your procedure, it is typical to have the following sensations: °· Minor discomfort or tenderness and a small bump at the catheter insertion site. The bump should usually decrease in size and tenderness within 1 to 2 weeks. °· Any bruising will usually fade within 2 to 4 weeks. °HOME CARE INSTRUCTIONS  °· You may need to keep taking blood thinners if they were prescribed for you. Take medicines only as directed by your health care provider. °· Do not apply powder or lotion to the site. °· Do not take baths, swim, or use a hot tub until your health care provider approves. °· You may shower 24 hours after the procedure. Remove the bandage (dressing) and gently wash the site with plain soap and water. Gently pat the site dry. °· Inspect the site at least twice daily. °· Limit your activity for the first 48 hours. Do not bend, squat, or lift anything over 20 lb (9 kg) or as directed by your health care provider. °· Plan to have someone take you home after the procedure. Follow instructions about when you can drive or return to work. °SEEK MEDICAL CARE IF: °· You get light-headed when standing up. °· You have drainage (other than a small amount of blood on the dressing). °· You have chills. °· You have a fever. °· You have redness, warmth, swelling, or pain at the insertion site. °SEEK IMMEDIATE MEDICAL CARE IF:  °· You develop chest pain or shortness of breath, feel faint, or pass out. °· You have bleeding, swelling larger than a walnut, or drainage from the  catheter insertion site. °· You develop pain, discoloration, coldness, or severe bruising in the leg or arm that held the catheter. °· You have heavy bleeding from the site. If this happens, hold pressure on the site and call 911. °MAKE SURE YOU: °· Understand these instructions. °· Will watch your condition. °· Will get help right away if you are not doing well or get worse. °Document Released: 07/13/2004 Document Revised: 05/11/2013 Document Reviewed: 05/19/2012 °ExitCare® Patient Information ©2015 [employer]. This information is not intended to replace advice given to you by your health care provider. Make sure you discuss any questions you have with your health care provider. ° °

## 2013-10-09 NOTE — CV Procedure (Signed)
      Cardiac Catheterization Operative Report  Nathaniel White [id number] 10/2/201512:01 PM Lorayne Marek, MD  Procedure Performed:  1. Left Heart Catheterization 2. Selective Coronary Angiography 3. Right Heart Catheterization  Operator: Lauree Chandler, MD  Indication: 53 yo male with history of moderate MR, HTN, CVA, DM, COPD, cardiomyopathy, ESRD on HD here today for cardiac cath. He is known to have a non-ischemic cardiomyopathy with LVEF30% by TEE in 2013 and f/u echo last week with LVEF=40-45%.  Also known to have moderate MR. He has not been seen by cardiology in the past. I met him two weeks ago.  Failed kidney transplant at [facility]. He has been having issues with SOB. Seen in Pulmonary clinic and referred to cardiology for discussion of CHF. Volume management is by dialysis. No prior cardiac cath. He told me that he has been having cramping across his chest with dialysis. This is a chest pressure that usually starts after 1-2 hours of dialysis. Also notes dyspnea with exertion and LE edema. Cough for one year.                                  Procedure Details: The risks, benefits, complications, treatment options, and expected outcomes were discussed with the patient. The patient and/or family concurred with the proposed plan, giving informed consent. The patient was brought to the cath lab after oral premedication was given. The patient was further sedated with Versed and Fentanyl. The right groin was prepped and draped in the usual manner. Using the modified Seldinger access technique, a 5 French sheath was placed in the right femoral artery. A 7 French sheath was placed into the right femoral vein. A balloon tipped catheter was used to perform a right heart catheterization. Standard diagnostic catheters were used to perform selective coronary angiography. A pigtail catheter was used to cross the aortic valve. No LV gram was performed. There were no immediate complications. The  patient was taken to the recovery area in stable condition.   Hemodynamic Findings: Ao:  130/86             LV: 129/15/22 RA: 20               RV: 76/16/26 PA: 74/38 (mean 530     PCWP:  32 Fick Cardiac Output: 6.8 L/min Fick Cardiac Index: 3.4 L/min/m2 Central Aortic Saturation: 92% Pulmonary Artery Saturation: 65%  Angiographic Findings:  Left main:  No obstructive disease.   Left Anterior Descending Artery: Large caliber vessel that courses to the apex. Small caliber diagonal branch. No obstructive disease.   Circumflex Artery: Large caliber vessel with three large obtuse marginal branches. No obstructive disease.   Right Coronary Artery: Very large dominant vessel with no obstructive disease.   Left Ventricular Angiogram: Deferred.   Impression: 1. No angiographic evidence of CAD 2. Non-cardiac chest pain 3. Elevated filling pressures  Recommendations: His chest pain and dyspnea are likely related to his volume overload and underlying lung disease. No evidence of CAD. Would try more aggressive volume removal with HD. Further pulmonary workup per pulmonary.        Complications:  None; patient tolerated the procedure well.

## 2013-10-09 NOTE — Progress Notes (Signed)
Pt with bleeding noted at right groin site, manual pressure held. Pt anxious and c/o SOB. O2 2LPM [place] applied and pt repositioned. Dr. Angelena Form informed of pt status. Orders received for anti-anxiety medication. No extension of bedrest ordered.

## 2013-10-09 NOTE — H&P (View-Only) (Signed)
History of Present Illness: 53 yo male with history of moderate MR, HTN, CVA, DM, COPD, cardiomyopathy, ESRD on HD here today as a new patient to establish cardiac care. He is known to have a non-ischemic cardiomyopathy with LVEF30% by TEE in 2013. Also known to have moderate MR. He has not been seen by cardiology in the past. Failed kidney transplant at [facility]. He has been having issues with SOB. Seen in Pulmonary clinic and referred here for discussion of CHF. Volume management is by dialysis. No prior cardiac cath.   He tells me that he has been having cramping across his chest with dialysis. This is a chest pressure that usually starts after 1-2 hours of dialysis. Also notes dyspnea with exertion and LE edema. Cough for one year.   Primary Care Physician: Lorayne Marek  Past Medical History  Diagnosis Date  . Hypertension   . ESRD     on HD  . Anemia   . Stroke   . Hemiparesis due to old stroke     left  . LV dysfunction     EF 25-30% by echo 07/2011  . MR (mitral regurgitation)     moderate to severe, echo 07/2011  . Shortness of breath   . Diabetes mellitus without complication   . CHF (congestive heart failure)   . COPD (chronic obstructive pulmonary disease)   . Tobacco abuse     Past Surgical History  Procedure Laterality Date  . Kidney transplant      2011 rejected kidney 2012 back on dialysis  . Av fistula placement    . Cardiac catheterization      [place] without cardioversion  08/17/2011    Procedure: TRANSESOPHAGEAL ECHOCARDIOGRAM (TEE);  Surgeon: Larey Dresser, MD;  Location: [place];  Service: Cardiovascular;  Laterality: N/A;  . Revison of arteriovenous fistula Left 08/26/2013    Procedure: EXCISION OF ERODED SKIN AND EXPLORATION OF MAIN LEFT UPPER ARM AV FISTULA;  Surgeon: Rosetta Posner, MD;  Location: [facility] OR;  Service: Vascular;  Laterality: Left;    Current Outpatient Prescriptions  Medication Sig Dispense Refill  . albuterol  (PROVENTIL HFA;VENTOLIN HFA) 108 (90 BASE) MCG/ACT inhaler Inhale 1-2 puffs into the lungs every 6 (six) hours as needed for wheezing.  1 Inhaler  0  . amLODipine (NORVASC) 10 MG tablet Take 10 mg by mouth every evening.      . benzonatate (TESSALON) 100 MG capsule Take 1 capsule (100 mg total) by mouth 3 (three) times daily as needed for cough.  30 capsule  1  . chlorpheniramine-HYDROcodone (TUSSIONEX PENNKINETIC ER) 10-8 MG/5ML LQCR Take 5 mLs by mouth every 12 (twelve) hours as needed for cough.  70 mL  0  . clonazePAM (KLONOPIN) 1 MG tablet Take 1 mg by mouth at bedtime.      . Fluticasone-Salmeterol (ADVAIR DISKUS) 250-50 MCG/DOSE AEPB Inhale 1 puff into the lungs 2 (two) times daily.  60 each  5  . labetalol (NORMODYNE) 300 MG tablet Take 150 mg by mouth 2 (two) times daily.      . predniSONE (DELTASONE) 50 MG tablet Take 1 tablet (50 mg total) by mouth daily.  5 tablet  0  . traMADol (ULTRAM) 50 MG tablet Take 1 tablet (50 mg total) by mouth every 6 (six) hours as needed. Take 1-2 tablets by mouth every 6 hours for pain  20 tablet  0   No current facility-administered medications for this visit.  Allergies  Allergen Reactions  . Codeine Shortness Of Breath    History   Social History  . Marital Status: Single    Spouse Name: N/A    Number of Children: 1  . Years of Education: N/A   Occupational History  . Retired-[employer]    Social History Main Topics  . Smoking status: Former Smoker -- 0.50 packs/day for 32 years    Types: Cigarettes    Quit date: 10/13/2012  . Smokeless tobacco: Never Used  . Alcohol Use: No  . Drug Use: No  . Sexual Activity: Not on file   Other Topics Concern  . Not on file   Social History Narrative  . No narrative on file    Family History  Problem Relation Age of Onset  . Hypertension Mother   . Varicose Veins Mother   . Diabetes Paternal Grandmother   . Cancer Paternal Grandfather   . CAD Paternal Uncle     Review of  Systems:  As stated in the HPI and otherwise negative.   BP 140/90  Pulse 95  Ht 5\' 9"  (1.753 m)  Wt 191 lb (86.637 kg)  BMI 28.19 kg/m2  SpO2 96%  Physical Examination: General: Well developed, well nourished, NAD HEENT: OP clear, mucus membranes moist SKIN: warm, dry. No rashes. Neuro: No focal deficits Musculoskeletal: Muscle strength 5/5 all ext Psychiatric: Mood and affect normal Neck: No JVD, no carotid bruits, no thyromegaly, no lymphadenopathy. Lungs:Clear bilaterally, no wheezes, rhonci, crackles Cardiovascular: Regular rate and rhythm. + systolic murmur. No gallops or rubs. Abdomen:Soft. Bowel sounds present. Non-tender.  Extremities: No lower extremity edema. Pulses are 2 + in the bilateral DP/PT.  Assessment and Plan:   1. Cardiomyopathy: No prior ischemic workup. LVEF 30% in 2013. Echo this week to reassess.   2. Chest pain: Concerning for unstable angina. Risk factors for CAD include ESRD on HD, DM, tobacco abuse, HTN. Will plan right and left heart cath next week 10/09/13 to exclude CAD and assess pressures. Risks and benefits reviewed with pt. Pre-cath labs after HD on 10/08/13.   3. HTN: BP managed by Nephrology  4. DM with complications (ESRD)  5. Tobacco abuse in remission: He stopped smoking 2014.   6. Mitral regurgitation: Moderate  By echo 2013. Repeat echo this week.

## 2013-10-09 NOTE — Progress Notes (Signed)
Site area: rt groin Site Prior to Removal:  Level 0 Pressure Applied For:35 minutes Manual:  pressure  Patient Status During Pull:  Coughing and tensing up Post Pull Site:  Level 0 Post Pull Instructions Given:  yes Post Pull Pulses Present: no change Dressing Applied:  Clean and dry Bedrest begins @ [id number] Comments:

## 2013-10-12 ENCOUNTER — Encounter: Payer: Self-pay | Admitting: Pulmonary Disease

## 2013-10-12 MED FILL — Hydralazine HCl Inj 20 MG/ML: INTRAMUSCULAR | Qty: 1 | Status: AC

## 2013-10-12 MED FILL — Lidocaine HCl Local Preservative Free (PF) Inj 1%: INTRAMUSCULAR | Qty: 30 | Status: AC

## 2013-10-12 MED FILL — Fentanyl Citrate Inj 0.05 MG/ML: INTRAMUSCULAR | Qty: 2 | Status: AC

## 2013-10-12 MED FILL — Midazolam HCl Inj 2 MG/2ML (Base Equivalent): INTRAMUSCULAR | Qty: 2 | Status: AC

## 2013-10-12 MED FILL — Heparin Sodium (Porcine) 2 Unit/ML in Sodium Chloride 0.9%: INTRAMUSCULAR | Qty: 1000 | Status: AC

## 2013-10-13 NOTE — Progress Notes (Signed)
Quick Note:  Spoke with pt, he is aware of results. Nothing further needed. ______

## 2013-10-28 ENCOUNTER — Encounter: Payer: Self-pay | Admitting: Surgery

## 2013-10-30 ENCOUNTER — Encounter: Payer: Self-pay | Admitting: Surgery

## 2013-11-02 ENCOUNTER — Encounter: Payer: Self-pay | Admitting: Surgery

## 2013-11-02 ENCOUNTER — Ambulatory Visit (INDEPENDENT_AMBULATORY_CARE_PROVIDER_SITE_OTHER): Payer: Medicare Other | Admitting: Surgery

## 2013-11-02 VITALS — BP 142/95 | HR 99 | Temp 98.1°F | Resp 16 | Ht 69.0 in | Wt 182.0 lb

## 2013-11-02 DIAGNOSIS — T829XXA Unspecified complication of cardiac and vascular prosthetic device, implant and graft, initial encounter: Secondary | ICD-10-CM

## 2013-11-02 DIAGNOSIS — Y841 Kidney dialysis as the cause of abnormal reaction of the patient, or of later complication, without mention of misadventure at the time of the procedure: Secondary | ICD-10-CM

## 2013-11-02 DIAGNOSIS — N186 End stage renal disease: Secondary | ICD-10-CM | POA: Diagnosis not present

## 2013-11-02 HISTORY — DX: Unspecified complication of cardiac and vascular prosthetic device, implant and graft, initial encounter: T82.9XXA

## 2013-11-02 NOTE — Progress Notes (Signed)
[facility] [place] Progress Note   CC:  Non-healing wound over left upper arm AVF Advani, Deepak, MD  HPI: This is a 53 y.o. male who presents today with eschar of left upper arm AVF for one week. He denies any bleeding with the wound. He denies any pain or swelling. He dialyzes on TTS and denies any issues with dialysis. He was seen by Dr. Donnetta Hutching on 08/25/13 for bleeding and ulceration from the same area.  He was taken to the OR for excision of eroded skin and exploration of his left upper arm AV fistula. The eschar was removed and it did not appear to affect the vein.   Past Medical History  Diagnosis Date  . Hypertension   . ESRD     on HD  . Anemia   . Stroke   . Hemiparesis due to old stroke     left  . LV dysfunction     EF 25-30% by echo 07/2011  . MR (mitral regurgitation)     moderate to severe, echo 07/2011  . Shortness of breath   . Diabetes mellitus without complication   . CHF (congestive heart failure)   . COPD (chronic obstructive pulmonary disease)   . Tobacco abuse    Past Surgical History  Procedure Laterality Date  . Kidney transplant      2011 rejected kidney 2012 back on dialysis  . Av fistula placement    . Cardiac catheterization      [place] without cardioversion  08/17/2011    Procedure: TRANSESOPHAGEAL ECHOCARDIOGRAM (TEE);  Surgeon: Larey Dresser, MD;  Location: [facility];  Service: Cardiovascular;  Laterality: N/A;  . Revison of arteriovenous fistula Left 08/26/2013    Procedure: EXCISION OF ERODED SKIN AND EXPLORATION OF MAIN LEFT UPPER ARM AV FISTULA;  Surgeon: Rosetta Posner, MD;  Location: [facility];  Service: Vascular;  Laterality: Left;    Allergies  Allergen Reactions  . Codeine Shortness Of Breath    Current Outpatient Prescriptions  Medication Sig Dispense Refill  . albuterol (PROVENTIL HFA;VENTOLIN HFA) 108 (90 BASE) MCG/ACT inhaler Inhale 1-2 puffs into the lungs every 6 (six) hours as needed for  wheezing.  1 Inhaler  0  . amLODipine (NORVASC) 10 MG tablet Take 10 mg by mouth every evening.      . Fluticasone-Salmeterol (ADVAIR DISKUS) 250-50 MCG/DOSE AEPB Inhale 1 puff into the lungs 2 (two) times daily.  60 each  5  . labetalol (NORMODYNE) 300 MG tablet Take 150 mg by mouth 2 (two) times daily.       No current facility-administered medications for this visit.    Family History  Problem Relation Age of Onset  . Hypertension Mother   . Varicose Veins Mother   . Diabetes Paternal Grandmother   . Cancer Paternal Grandfather   . CAD Paternal Uncle     History   Social History  . Marital Status: Single    Spouse Name: N/A    Number of Children: 1  . Years of Education: N/A   Occupational History  . Retired-[employer]    Social History Main Topics  . Smoking status: Former Smoker -- 0.50 packs/day for 32 years    Types: Cigarettes    Quit date: 10/13/2012  . Smokeless tobacco: Never Used  . Alcohol Use: No  . Drug Use: No  . Sexual Activity: Not on file   Other Topics Concern  . Not on file  Social History Narrative  . No narrative on file     PHYSICAL EXAMINATION:  Filed Vitals:   11/02/13 1454  BP: 142/95  Pulse: 99  Temp: 98.1 F (36.7 C)  Resp: 16   Body mass index is 26.86 kg/(m^2).  General:  WDWN in NAD Lungs: non-labored breathing Vascular: 12mm area of dry eschar over left upper arm fistula. Aneurysm of middle and upper segments of fistula. Palpable thrill and audible bruit present. Palpable left radial pulse.  Extremities: no ischemic changes,  sensation intact.  Musculoskeletal: 5/5 strength LUE  ASSESSMENT: 53 y.o. male with ESRD on HD TTS with aneurysmal degeneration of left upper arm AVF and ulceration  PLAN: Previous multiple episodes of ulceration with fistula. His left upper arm fistula aneurysms have been stable for the past year, but there is concern for continuing degeneration.  He will be scheduled for plication of his  left upper arm AV fistula on 11/10/13 with Dr. Trula Slade.    Virgina Jock, PA-C [facility] [phone]  Clinic MD:  Pt seen and examined in conjunction with Dr. Trula Slade.   I agree with the above.  I have seen and examined the patient.  He has had recurrence of the ulcer in his left arm fistula.  There are 2 large aneurysmal sections.  Because this area has recurred, I think he needs to proceed with aneurysmorrhaphy and resection of the lesion.  This was scheduled for earlier this year but the patient did not follow through.  He has a history of central venous stenosis which has been dilated historically.  I have scheduled him for aneurysmorrhaphy and resection of the ulcerated area this coming Tuesday, November 3

## 2013-11-03 ENCOUNTER — Encounter (HOSPITAL_COMMUNITY): Payer: Self-pay | Admitting: Pharmacy Technician

## 2013-11-03 ENCOUNTER — Other Ambulatory Visit: Payer: Self-pay

## 2013-11-05 DIAGNOSIS — E119 Type 2 diabetes mellitus without complications: Secondary | ICD-10-CM | POA: Diagnosis not present

## 2013-11-07 DIAGNOSIS — Z992 Dependence on renal dialysis: Secondary | ICD-10-CM | POA: Diagnosis not present

## 2013-11-07 DIAGNOSIS — N186 End stage renal disease: Secondary | ICD-10-CM | POA: Diagnosis not present

## 2013-11-09 ENCOUNTER — Ambulatory Visit: Payer: Medicare Other | Admitting: Cardiology

## 2013-11-09 ENCOUNTER — Encounter (HOSPITAL_COMMUNITY): Payer: Self-pay | Admitting: *Deleted

## 2013-11-09 DIAGNOSIS — N186 End stage renal disease: Secondary | ICD-10-CM | POA: Diagnosis not present

## 2013-11-09 DIAGNOSIS — E119 Type 2 diabetes mellitus without complications: Secondary | ICD-10-CM | POA: Diagnosis not present

## 2013-11-09 DIAGNOSIS — D631 Anemia in chronic kidney disease: Secondary | ICD-10-CM | POA: Diagnosis not present

## 2013-11-09 DIAGNOSIS — N2581 Secondary hyperparathyroidism of renal origin: Secondary | ICD-10-CM | POA: Diagnosis not present

## 2013-11-09 DIAGNOSIS — D509 Iron deficiency anemia, unspecified: Secondary | ICD-10-CM | POA: Diagnosis not present

## 2013-11-09 MED ORDER — LIDOCAINE HCL (PF) 1 % IJ SOLN: 5.0000 mL | INTRAMUSCULAR | Status: DC | PRN

## 2013-11-09 MED ORDER — NEPRO/CARBSTEADY PO LIQD: 237.0000 mL | ORAL | Status: DC | PRN

## 2013-11-09 MED ORDER — HEPARIN SODIUM (PORCINE) 1000 UNIT/ML DIALYSIS: 20.0000 [IU]/kg | INTRAMUSCULAR | Status: DC | PRN

## 2013-11-09 MED ORDER — HEPARIN SODIUM (PORCINE) 1000 UNIT/ML DIALYSIS: 1000.0000 [IU] | INTRAMUSCULAR | Status: DC | PRN

## 2013-11-09 MED ORDER — CHLORHEXIDINE GLUCONATE CLOTH 2 % EX PADS: 6.0000 | MEDICATED_PAD | Freq: Once | CUTANEOUS | Status: DC

## 2013-11-09 MED ORDER — SODIUM CHLORIDE 0.9 % IV SOLN: 100.0000 mL | INTRAVENOUS | Status: DC | PRN

## 2013-11-09 MED ORDER — PENTAFLUOROPROP-TETRAFLUOROETH EX AERO: 1.0000 "application " | INHALATION_SPRAY | CUTANEOUS | Status: DC | PRN

## 2013-11-09 MED ORDER — DEXTROSE 5 % IV SOLN
1.5000 g | INTRAVENOUS | Status: AC
  Administered 2013-11-10: 1.5 g via INTRAVENOUS
  Filled 2013-11-09: qty 1.5

## 2013-11-09 MED ORDER — SODIUM CHLORIDE 0.9 % IV SOLN
INTRAVENOUS | Status: DC
  Administered 2013-11-10 (×3): via INTRAVENOUS

## 2013-11-09 MED ORDER — LIDOCAINE-PRILOCAINE 2.5-2.5 % EX CREA: 1.0000 "application " | TOPICAL_CREAM | CUTANEOUS | Status: DC | PRN

## 2013-11-09 MED ORDER — ALTEPLASE 2 MG IJ SOLR: 2.0000 mg | Freq: Once | INTRAMUSCULAR | Status: DC | PRN

## 2013-11-10 ENCOUNTER — Ambulatory Visit (HOSPITAL_COMMUNITY): Payer: Medicare Other | Admitting: Certified Registered Nurse Anesthetist

## 2013-11-10 ENCOUNTER — Encounter (HOSPITAL_COMMUNITY)
Admission: RE | Disposition: A | Discharge status: Home / Self Care | Payer: Self-pay | Source: Ambulatory Visit | Attending: Surgery

## 2013-11-10 ENCOUNTER — Inpatient Hospital Stay (HOSPITAL_COMMUNITY): Payer: Medicare Other

## 2013-11-10 ENCOUNTER — Inpatient Hospital Stay (HOSPITAL_COMMUNITY)
Admission: RE | Admit: 2013-11-10 | Discharge: 2013-11-11 | DRG: 263 | Disposition: A | Discharge status: Home / Self Care | Payer: Medicare Other | Source: Ambulatory Visit | Attending: Surgery | Admitting: Surgery

## 2013-11-10 ENCOUNTER — Telehealth: Payer: Self-pay | Admitting: Surgery

## 2013-11-10 ENCOUNTER — Encounter (HOSPITAL_COMMUNITY): Payer: Self-pay | Admitting: Certified Registered Nurse Anesthetist

## 2013-11-10 DIAGNOSIS — I120 Hypertensive chronic kidney disease with stage 5 chronic kidney disease or end stage renal disease: Secondary | ICD-10-CM | POA: Diagnosis present

## 2013-11-10 DIAGNOSIS — N186 End stage renal disease: Secondary | ICD-10-CM | POA: Diagnosis present

## 2013-11-10 DIAGNOSIS — Z885 Allergy status to narcotic agent status: Secondary | ICD-10-CM

## 2013-11-10 DIAGNOSIS — J984 Other disorders of lung: Secondary | ICD-10-CM | POA: Diagnosis not present

## 2013-11-10 DIAGNOSIS — Z7951 Long term (current) use of inhaled steroids: Secondary | ICD-10-CM | POA: Diagnosis not present

## 2013-11-10 DIAGNOSIS — T82898A Other specified complication of vascular prosthetic devices, implants and grafts, initial encounter: Secondary | ICD-10-CM | POA: Diagnosis not present

## 2013-11-10 DIAGNOSIS — J45909 Unspecified asthma, uncomplicated: Secondary | ICD-10-CM | POA: Diagnosis present

## 2013-11-10 DIAGNOSIS — J4520 Mild intermittent asthma, uncomplicated: Secondary | ICD-10-CM

## 2013-11-10 DIAGNOSIS — G9340 Encephalopathy, unspecified: Secondary | ICD-10-CM

## 2013-11-10 DIAGNOSIS — I5022 Chronic systolic (congestive) heart failure: Secondary | ICD-10-CM

## 2013-11-10 DIAGNOSIS — Z992 Dependence on renal dialysis: Secondary | ICD-10-CM | POA: Diagnosis not present

## 2013-11-10 DIAGNOSIS — I509 Heart failure, unspecified: Secondary | ICD-10-CM | POA: Diagnosis present

## 2013-11-10 DIAGNOSIS — Z79899 Other long term (current) drug therapy: Secondary | ICD-10-CM

## 2013-11-10 DIAGNOSIS — J9600 Acute respiratory failure, unspecified whether with hypoxia or hypercapnia: Secondary | ICD-10-CM

## 2013-11-10 DIAGNOSIS — J45901 Unspecified asthma with (acute) exacerbation: Secondary | ICD-10-CM | POA: Diagnosis not present

## 2013-11-10 DIAGNOSIS — E119 Type 2 diabetes mellitus without complications: Secondary | ICD-10-CM | POA: Diagnosis present

## 2013-11-10 DIAGNOSIS — J449 Chronic obstructive pulmonary disease, unspecified: Secondary | ICD-10-CM | POA: Diagnosis not present

## 2013-11-10 HISTORY — DX: Anxiety disorder, unspecified: F41.9

## 2013-11-10 HISTORY — PX: FISTULA SUPERFICIALIZATION: SHX6341

## 2013-11-10 HISTORY — DX: Unspecified asthma, uncomplicated: J45.909

## 2013-11-10 HISTORY — DX: Other amnesia: R41.3

## 2013-11-10 LAB — BASIC METABOLIC PANEL
Anion gap: 19 — ABNORMAL HIGH (ref 5–15)
BUN: 46 mg/dL — ABNORMAL HIGH (ref 6–23)
CO2: 23 mEq/L (ref 19–32)
CREATININE: 9.02 mg/dL — AB (ref 0.50–1.35)
Calcium: 9.3 mg/dL (ref 8.4–10.5)
Chloride: 103 mEq/L (ref 96–112)
GFR, EST AFRICAN AMERICAN: 7 mL/min — AB (ref >90)
GFR, EST NON AFRICAN AMERICAN: 6 mL/min — AB (ref >90)
Glucose, Bld: 78 mg/dL (ref 70–99)
POTASSIUM: 4.7 meq/L (ref 3.7–5.3)
Sodium: 145 mEq/L (ref 137–147)

## 2013-11-10 LAB — GLUCOSE, CAPILLARY
GLUCOSE-CAPILLARY: 73 mg/dL (ref 70–99)
GLUCOSE-CAPILLARY: 98 mg/dL (ref 70–99)
Glucose-Capillary: 103 mg/dL — ABNORMAL HIGH (ref 70–99)
Glucose-Capillary: 78 mg/dL (ref 70–99)

## 2013-11-10 LAB — POCT I-STAT 4, (NA,K, GLUC, HGB,HCT)
Glucose, Bld: 89 mg/dL (ref 70–99)
HCT: 36 % — ABNORMAL LOW (ref 39.0–52.0)
Hemoglobin: 12.2 g/dL — ABNORMAL LOW (ref 13.0–17.0)
Potassium: 4.7 mEq/L (ref 3.7–5.3)
SODIUM: 140 meq/L (ref 137–147)

## 2013-11-10 LAB — CBC
HCT: 29.9 % — ABNORMAL LOW (ref 39.0–52.0)
Hemoglobin: 9.5 g/dL — ABNORMAL LOW (ref 13.0–17.0)
MCH: 28.6 pg (ref 26.0–34.0)
MCHC: 31.8 g/dL (ref 30.0–36.0)
MCV: 90.1 fL (ref 78.0–100.0)
PLATELETS: 87 10*3/uL — AB (ref 150–400)
RBC: 3.32 MIL/uL — AB (ref 4.22–5.81)
RDW: 17.2 % — ABNORMAL HIGH (ref 11.5–15.5)
WBC: 4.4 10*3/uL (ref 4.0–10.5)

## 2013-11-10 LAB — MRSA PCR SCREENING: MRSA by PCR: NEGATIVE

## 2013-11-10 SURGERY — FISTULA SUPERFICIALIZATION
Anesthesia: General | Site: Arm Upper | Laterality: Left

## 2013-11-10 MED ORDER — NEOSTIGMINE METHYLSULFATE 10 MG/10ML IV SOLN
INTRAVENOUS | Status: AC
  Filled 2013-11-10: qty 1

## 2013-11-10 MED ORDER — ALBUTEROL SULFATE (2.5 MG/3ML) 0.083% IN NEBU: 2.5000 mg | INHALATION_SOLUTION | Freq: Four times a day (QID) | RESPIRATORY_TRACT | Status: DC | PRN

## 2013-11-10 MED ORDER — PROTAMINE SULFATE 10 MG/ML IV SOLN
INTRAVENOUS | Status: DC | PRN
  Administered 2013-11-10: 25 mg via INTRAVENOUS

## 2013-11-10 MED ORDER — DOPAMINE-DEXTROSE 3.2-5 MG/ML-% IV SOLN: 3.0000 ug/kg/min | INTRAVENOUS | Status: DC | PRN

## 2013-11-10 MED ORDER — OXYCODONE HCL 5 MG/5ML PO SOLN: 5.0000 mg | Freq: Once | ORAL | Status: DC | PRN

## 2013-11-10 MED ORDER — FENTANYL CITRATE 0.05 MG/ML IJ SOLN: 12.5000 ug | INTRAMUSCULAR | Status: DC | PRN

## 2013-11-10 MED ORDER — FENTANYL CITRATE 0.05 MG/ML IJ SOLN: 50.0000 ug | INTRAMUSCULAR | Status: DC | PRN

## 2013-11-10 MED ORDER — MIDAZOLAM HCL 2 MG/2ML IJ SOLN
INTRAMUSCULAR | Status: AC
  Filled 2013-11-10: qty 2

## 2013-11-10 MED ORDER — DEXTROSE 5 % IV SOLN
1.5000 g | Freq: Two times a day (BID) | INTRAVENOUS | Status: DC
  Administered 2013-11-11: 1.5 g via INTRAVENOUS
  Filled 2013-11-10 (×2): qty 1.5

## 2013-11-10 MED ORDER — SODIUM CHLORIDE 0.9 % IV SOLN
10.0000 mg | INTRAVENOUS | Status: DC | PRN
  Administered 2013-11-10: 40 ug/min via INTRAVENOUS

## 2013-11-10 MED ORDER — ATROPINE SULFATE 0.4 MG/ML IJ SOLN
INTRAMUSCULAR | Status: DC | PRN
  Administered 2013-11-10: 0.4 mg via INTRAVENOUS

## 2013-11-10 MED ORDER — PHENYLEPHRINE HCL 10 MG/ML IJ SOLN
INTRAMUSCULAR | Status: DC | PRN
  Administered 2013-11-10: 80 ug via INTRAVENOUS
  Administered 2013-11-10 (×5): 40 ug via INTRAVENOUS

## 2013-11-10 MED ORDER — PANTOPRAZOLE SODIUM 40 MG PO TBEC
40.0000 mg | DELAYED_RELEASE_TABLET | Freq: Every day | ORAL | Status: DC
  Administered 2013-11-11: 40 mg via ORAL
  Filled 2013-11-10: qty 1

## 2013-11-10 MED ORDER — TRAMADOL HCL 50 MG PO TABS: 50.0000 mg | ORAL_TABLET | Freq: Four times a day (QID) | ORAL | Status: DC | PRN

## 2013-11-10 MED ORDER — PROPOFOL 10 MG/ML IV BOLUS
INTRAVENOUS | Status: AC
  Filled 2013-11-10: qty 20

## 2013-11-10 MED ORDER — FENTANYL CITRATE 0.05 MG/ML IJ SOLN
INTRAMUSCULAR | Status: DC | PRN
  Administered 2013-11-10 (×5): 50 ug via INTRAVENOUS

## 2013-11-10 MED ORDER — GUAIFENESIN-DM 100-10 MG/5ML PO SYRP: 15.0000 mL | ORAL_SOLUTION | ORAL | Status: DC | PRN

## 2013-11-10 MED ORDER — LIDOCAINE HCL (CARDIAC) 20 MG/ML IV SOLN
INTRAVENOUS | Status: AC
  Filled 2013-11-10: qty 5

## 2013-11-10 MED ORDER — MAGNESIUM SULFATE 2 GM/50ML IV SOLN: 2.0000 g | Freq: Every day | INTRAVENOUS | Status: DC | PRN

## 2013-11-10 MED ORDER — SODIUM CHLORIDE 0.9 % IV SOLN: INTRAVENOUS | Status: DC

## 2013-11-10 MED ORDER — SODIUM CHLORIDE 0.9 % IV SOLN: 500.0000 mL | Freq: Once | INTRAVENOUS | Status: AC | PRN

## 2013-11-10 MED ORDER — HEPARIN SODIUM (PORCINE) 5000 UNIT/ML IJ SOLN
INTRAMUSCULAR | Status: DC | PRN
  Administered 2013-11-10: 500 mL

## 2013-11-10 MED ORDER — NALOXONE HCL 0.4 MG/ML IJ SOLN
INTRAMUSCULAR | Status: DC | PRN
  Administered 2013-11-10 (×5): 40 ug via INTRAVENOUS

## 2013-11-10 MED ORDER — MOMETASONE FURO-FORMOTEROL FUM 100-5 MCG/ACT IN AERO
2.0000 | INHALATION_SPRAY | Freq: Two times a day (BID) | RESPIRATORY_TRACT | Status: DC
  Administered 2013-11-11: 2 via RESPIRATORY_TRACT
  Filled 2013-11-10: qty 8.8

## 2013-11-10 MED ORDER — DEXMEDETOMIDINE HCL IN NACL 200 MCG/50ML IV SOLN
0.0000 ug/kg/h | INTRAVENOUS | Status: DC
  Administered 2013-11-10: 0.7 ug/kg/h via INTRAVENOUS
  Administered 2013-11-10: 1.2 ug/kg/h via INTRAVENOUS
  Filled 2013-11-10 (×2): qty 50

## 2013-11-10 MED ORDER — BISACODYL 10 MG RE SUPP: 10.0000 mg | Freq: Every day | RECTAL | Status: DC | PRN

## 2013-11-10 MED ORDER — PROPOFOL 10 MG/ML IV EMUL: 0.0000 ug/kg/min | INTRAVENOUS | Status: DC

## 2013-11-10 MED ORDER — OXYCODONE HCL 5 MG PO TABS
5.0000 mg | ORAL_TABLET | Freq: Once | ORAL | Status: DC | PRN
Start: 2013-11-10 — End: 2013-11-10

## 2013-11-10 MED ORDER — MORPHINE SULFATE 2 MG/ML IJ SOLN: 2.0000 mg | INTRAMUSCULAR | Status: DC | PRN

## 2013-11-10 MED ORDER — MIDAZOLAM HCL 5 MG/5ML IJ SOLN
INTRAMUSCULAR | Status: DC | PRN
  Administered 2013-11-10: 2 mg via INTRAVENOUS

## 2013-11-10 MED ORDER — PHENYLEPHRINE HCL 10 MG/ML IJ SOLN
INTRAMUSCULAR | Status: AC
  Filled 2013-11-10: qty 1

## 2013-11-10 MED ORDER — METOPROLOL TARTRATE 1 MG/ML IV SOLN: 2.0000 mg | INTRAVENOUS | Status: DC | PRN

## 2013-11-10 MED ORDER — LABETALOL HCL 300 MG PO TABS
300.0000 mg | ORAL_TABLET | Freq: Two times a day (BID) | ORAL | Status: DC
Start: 2013-11-10 — End: 2013-11-11
  Administered 2013-11-11: 300 mg via ORAL
  Filled 2013-11-10 (×3): qty 1

## 2013-11-10 MED ORDER — ONDANSETRON HCL 4 MG/2ML IJ SOLN
INTRAMUSCULAR | Status: DC | PRN
  Administered 2013-11-10: 4 mg via INTRAVENOUS

## 2013-11-10 MED ORDER — ALUM & MAG HYDROXIDE-SIMETH 200-200-20 MG/5ML PO SUSP: 15.0000 mL | ORAL | Status: DC | PRN

## 2013-11-10 MED ORDER — FENTANYL CITRATE 0.05 MG/ML IJ SOLN: 25.0000 ug | INTRAMUSCULAR | Status: DC | PRN

## 2013-11-10 MED ORDER — HEPARIN SODIUM (PORCINE) 1000 UNIT/ML IJ SOLN
INTRAMUSCULAR | Status: DC | PRN
  Administered 2013-11-10: 3 mL via INTRAVENOUS

## 2013-11-10 MED ORDER — POTASSIUM CHLORIDE CRYS ER 20 MEQ PO TBCR: 20.0000 meq | EXTENDED_RELEASE_TABLET | Freq: Every day | ORAL | Status: DC | PRN

## 2013-11-10 MED ORDER — HALOPERIDOL LACTATE 5 MG/ML IJ SOLN
1.0000 mg | INTRAMUSCULAR | Status: DC | PRN
  Administered 2013-11-10: 2 mg via INTRAVENOUS
  Filled 2013-11-10: qty 1

## 2013-11-10 MED ORDER — ROCURONIUM BROMIDE 50 MG/5ML IV SOLN
INTRAVENOUS | Status: AC
  Filled 2013-11-10: qty 1

## 2013-11-10 MED ORDER — ACETAMINOPHEN 325 MG PO TABS: 325.0000 mg | ORAL_TABLET | ORAL | Status: DC | PRN

## 2013-11-10 MED ORDER — ROCURONIUM BROMIDE 100 MG/10ML IV SOLN
INTRAVENOUS | Status: DC | PRN
  Administered 2013-11-10: 30 mg via INTRAVENOUS
  Administered 2013-11-10: 20 mg via INTRAVENOUS

## 2013-11-10 MED ORDER — PROPOFOL 10 MG/ML IV EMUL
INTRAVENOUS | Status: AC
  Filled 2013-11-10: qty 100

## 2013-11-10 MED ORDER — ONDANSETRON HCL 4 MG/2ML IJ SOLN: 4.0000 mg | Freq: Four times a day (QID) | INTRAMUSCULAR | Status: DC | PRN

## 2013-11-10 MED ORDER — GLYCOPYRROLATE 0.2 MG/ML IJ SOLN
INTRAMUSCULAR | Status: AC
  Filled 2013-11-10: qty 4

## 2013-11-10 MED ORDER — LABETALOL HCL 5 MG/ML IV SOLN: 10.0000 mg | INTRAVENOUS | Status: DC | PRN

## 2013-11-10 MED ORDER — CETYLPYRIDINIUM CHLORIDE 0.05 % MT LIQD
7.0000 mL | Freq: Two times a day (BID) | OROMUCOSAL | Status: DC
  Administered 2013-11-10 – 2013-11-11 (×2): 7 mL via OROMUCOSAL

## 2013-11-10 MED ORDER — LIDOCAINE HCL (CARDIAC) 20 MG/ML IV SOLN
INTRAVENOUS | Status: DC | PRN
  Administered 2013-11-10: 60 mg via INTRAVENOUS

## 2013-11-10 MED ORDER — EPHEDRINE SULFATE 50 MG/ML IJ SOLN
INTRAMUSCULAR | Status: DC | PRN
  Administered 2013-11-10: 5 mg via INTRAVENOUS

## 2013-11-10 MED ORDER — HEMOSTATIC AGENTS (NO CHARGE) OPTIME
TOPICAL | Status: DC | PRN
  Administered 2013-11-10: 1 via TOPICAL

## 2013-11-10 MED ORDER — NEOSTIGMINE METHYLSULFATE 10 MG/10ML IV SOLN
INTRAVENOUS | Status: DC | PRN
  Administered 2013-11-10: 5 mg via INTRAVENOUS

## 2013-11-10 MED ORDER — PROPOFOL 10 MG/ML IV BOLUS
INTRAVENOUS | Status: DC | PRN
  Administered 2013-11-10: 200 mg via INTRAVENOUS
  Administered 2013-11-10: 100 mg via INTRAVENOUS
  Administered 2013-11-10: 50 mg via INTRAVENOUS

## 2013-11-10 MED ORDER — ONDANSETRON HCL 4 MG/2ML IJ SOLN
INTRAMUSCULAR | Status: AC
  Filled 2013-11-10: qty 2

## 2013-11-10 MED ORDER — NEOSTIGMINE METHYLSULFATE 10 MG/10ML IV SOLN
INTRAVENOUS | Status: AC
Start: 2013-11-10 — End: 2013-11-10
  Filled 2013-11-10: qty 1

## 2013-11-10 MED ORDER — ALBUTEROL SULFATE (2.5 MG/3ML) 0.083% IN NEBU: 2.5000 mg | INHALATION_SOLUTION | RESPIRATORY_TRACT | Status: DC | PRN

## 2013-11-10 MED ORDER — GLYCOPYRROLATE 0.2 MG/ML IJ SOLN
INTRAMUSCULAR | Status: AC
  Filled 2013-11-10: qty 3

## 2013-11-10 MED ORDER — GLYCOPYRROLATE 0.2 MG/ML IJ SOLN
INTRAMUSCULAR | Status: DC | PRN
  Administered 2013-11-10: 0.2 mg via INTRAVENOUS
  Administered 2013-11-10: .8 mg via INTRAVENOUS

## 2013-11-10 MED ORDER — INSULIN ASPART 100 UNIT/ML ~~LOC~~ SOLN
0.0000 [IU] | SUBCUTANEOUS | Status: DC
Start: 2013-11-10 — End: 2013-11-11
  Administered 2013-11-11: 1 [IU] via SUBCUTANEOUS

## 2013-11-10 MED ORDER — HYDRALAZINE HCL 20 MG/ML IJ SOLN: 5.0000 mg | INTRAMUSCULAR | Status: DC | PRN

## 2013-11-10 MED ORDER — FENTANYL CITRATE 0.05 MG/ML IJ SOLN
INTRAMUSCULAR | Status: AC
  Filled 2013-11-10: qty 5

## 2013-11-10 MED ORDER — ALBUTEROL SULFATE HFA 108 (90 BASE) MCG/ACT IN AERS: 1.0000 | INHALATION_SPRAY | Freq: Four times a day (QID) | RESPIRATORY_TRACT | Status: DC | PRN

## 2013-11-10 MED ORDER — 0.9 % SODIUM CHLORIDE (POUR BTL) OPTIME
TOPICAL | Status: DC | PRN
  Administered 2013-11-10: 1000 mL

## 2013-11-10 MED ORDER — ACETAMINOPHEN 650 MG RE SUPP: 325.0000 mg | RECTAL | Status: DC | PRN

## 2013-11-10 MED ORDER — ENOXAPARIN SODIUM 30 MG/0.3ML ~~LOC~~ SOLN
30.0000 mg | SUBCUTANEOUS | Status: DC
  Administered 2013-11-11: 30 mg via SUBCUTANEOUS
  Filled 2013-11-10: qty 0.3

## 2013-11-10 MED ORDER — PROMETHAZINE HCL 25 MG/ML IJ SOLN: 6.2500 mg | INTRAMUSCULAR | Status: DC | PRN

## 2013-11-10 MED ORDER — DOCUSATE SODIUM 100 MG PO CAPS
100.0000 mg | ORAL_CAPSULE | Freq: Every day | ORAL | Status: DC
  Administered 2013-11-11: 100 mg via ORAL
  Filled 2013-11-10: qty 1

## 2013-11-10 MED ORDER — PHENOL 1.4 % MT LIQD: 1.0000 | OROMUCOSAL | Status: DC | PRN

## 2013-11-10 SURGICAL SUPPLY — 42 items
BNDG GAUZE ELAST 4 BULKY (GAUZE/BANDAGES/DRESSINGS) ×2 IMPLANT
CANISTER SUCTION 2500CC (MISCELLANEOUS) ×2 IMPLANT
CLIP TI MEDIUM 6 (CLIP) ×2 IMPLANT
CLIP TI WIDE RED SMALL 6 (CLIP) ×2 IMPLANT
COVER PROBE W GEL 5X96 (DRAPES) ×2 IMPLANT
COVER SURGICAL LIGHT HANDLE (MISCELLANEOUS) ×2 IMPLANT
DERMABOND ADVANCED (GAUZE/BANDAGES/DRESSINGS) ×1
DERMABOND ADVANCED .7 DNX12 (GAUZE/BANDAGES/DRESSINGS) ×1 IMPLANT
ELECT CAUTERY BLADE 6.4 (BLADE) ×2 IMPLANT
ELECT REM PT RETURN 9FT ADLT (ELECTROSURGICAL) ×2
ELECTRODE REM PT RTRN 9FT ADLT (ELECTROSURGICAL) ×1 IMPLANT
GLOVE BIO SURGEON STRL SZ 6.5 (GLOVE) ×2 IMPLANT
GLOVE BIOGEL PI IND STRL 6.5 (GLOVE) ×2 IMPLANT
GLOVE BIOGEL PI IND STRL 7.0 (GLOVE) ×1 IMPLANT
GLOVE BIOGEL PI IND STRL 7.5 (GLOVE) ×1 IMPLANT
GLOVE BIOGEL PI INDICATOR 6.5 (GLOVE) ×2
GLOVE BIOGEL PI INDICATOR 7.0 (GLOVE) ×1
GLOVE BIOGEL PI INDICATOR 7.5 (GLOVE) ×1
GLOVE SURG SS PI 6.5 STRL IVOR (GLOVE) ×2 IMPLANT
GLOVE SURG SS PI 7.5 STRL IVOR (GLOVE) ×2 IMPLANT
GOWN STRL REUS W/ TWL LRG LVL3 (GOWN DISPOSABLE) ×2 IMPLANT
GOWN STRL REUS W/ TWL XL LVL3 (GOWN DISPOSABLE) ×1 IMPLANT
GOWN STRL REUS W/TWL LRG LVL3 (GOWN DISPOSABLE) ×2
GOWN STRL REUS W/TWL XL LVL3 (GOWN DISPOSABLE) ×1
HEMOSTAT SNOW SURGICEL 2X4 (HEMOSTASIS) ×2 IMPLANT
KIT BASIN OR (CUSTOM PROCEDURE TRAY) ×2 IMPLANT
KIT ROOM TURNOVER OR (KITS) ×2 IMPLANT
NS IRRIG 1000ML POUR BTL (IV SOLUTION) ×2 IMPLANT
PACK CV ACCESS (CUSTOM PROCEDURE TRAY) ×2 IMPLANT
PAD ARMBOARD 7.5X6 YLW CONV (MISCELLANEOUS) ×4 IMPLANT
PENCIL BUTTON HOLSTER BLD 10FT (ELECTRODE) ×2 IMPLANT
SPONGE GAUZE 4X4 12PLY STER LF (GAUZE/BANDAGES/DRESSINGS) ×2 IMPLANT
SUT ETHILON 3 0 PS 1 (SUTURE) ×4 IMPLANT
SUT PROLENE 5 0 C 1 24 (SUTURE) ×10 IMPLANT
SUT PROLENE 6 0 CC (SUTURE) ×2 IMPLANT
SUT VIC AB 3-0 SH 27 (SUTURE) ×1
SUT VIC AB 3-0 SH 27X BRD (SUTURE) ×1 IMPLANT
SUT VICRYL 4-0 PS2 18IN ABS (SUTURE) IMPLANT
TOWEL OR 17X24 6PK STRL BLUE (TOWEL DISPOSABLE) ×2 IMPLANT
TOWEL OR 17X26 10 PK STRL BLUE (TOWEL DISPOSABLE) ×2 IMPLANT
UNDERPAD 30X30 INCONTINENT (UNDERPADS AND DIAPERS) ×2 IMPLANT
WATER STERILE IRR 1000ML POUR (IV SOLUTION) ×2 IMPLANT

## 2013-11-10 NOTE — Consult Note (Signed)
PULMONARY / CRITICAL CARE MEDICINE   Name: Nathaniel White MRN: [id number] DOB: 1960-10-25    ADMISSION DATE:  11/10/2013 CONSULTATION DATE:  11/10/2013  REFERRING MD :  Trula Slade  CHIEF COMPLAINT:  Acute encephalopathy post op  INITIAL PRESENTATION: 53 y/o male admitted on 11/3 for an elective left upper arm plication and anuerysm resection of left upper arm fistula.  The operation was attempted with MAC and and LMA, but he became combative so the case was converted to general anesthesia.  The case was uncomplicated but he was too somnolent post operatively to be extubated so he was sent to the ICU.  STUDIES:    SIGNIFICANT EVENTS: [id number]  plication and anuerysm resection of left upper arm fistula   HISTORY OF PRESENT ILLNESS:  53 y/o male admitted on 11/3 for an elective left upper arm plication and anuerysm resection of left upper arm fistula.  The operation was attempted with MAC and and LMA, but he became combative so the case was converted to general anesthesia.  The case was uncomplicated but he was too somnolent post operatively to be extubated so he was sent to the ICU.  The patient was encephalopathic on my exam so further history could not be obtained.   PAST MEDICAL HISTORY :  Past Medical History  Diagnosis Date  . Hypertension   . Anemia   . Hemiparesis due to old stroke     left  . LV dysfunction     EF 25-30% by echo 07/2011  . MR (mitral regurgitation)     moderate to severe, echo 07/2011  . Shortness of breath   . CHF (congestive heart failure)   . COPD (chronic obstructive pulmonary disease)   . Tobacco abuse   . Stroke     TIA's  . Asthma   . Diabetes mellitus without complication     not on medications  . Anxiety   . ESRD     on HD, M- W- F   . Memory loss due to medical condition     due to stroke    Prior to Admission medications   Medication Sig Start Date End Date Taking? Authorizing Provider  albuterol (PROVENTIL HFA;VENTOLIN HFA) 108 (90 BASE)  MCG/ACT inhaler Inhale 1-2 puffs into the lungs every 6 (six) hours as needed for wheezing. 07/01/12  Yes April K Palumbo-Rasch, MD  Fluticasone-Salmeterol (ADVAIR) 250-50 MCG/DOSE AEPB Inhale 2 puffs into the lungs daily at 12 noon.   Yes Historical Provider, MD  labetalol (NORMODYNE) 300 MG tablet Take 300 mg by mouth 2 (two) times daily.    Yes Historical Provider, MD  traMADol (ULTRAM) 50 MG tablet Take 1 tablet (50 mg total) by mouth every 6 (six) hours as needed. 11/10/13   Alvia Grove, PA-C   Allergies  Allergen Reactions  . Codeine Shortness Of Breath    FAMILY HISTORY: SOCIAL HISTORY:REVIEW OF SYSTEMS:  Cannot obtain due to encephalopathy  SUBJECTIVE:   VITAL SIGNS: Temp:  [96.2 F (35.7 C)-98.9 F (37.2 C)] 98.9 F (37.2 C) (11/03 1715) Pulse Rate:  [32-143] 101 (11/03 1814) Resp:  [14-34] 24 (11/03 1744) BP: (112-188)/(66-127) 129/81 mmHg (11/03 1814) SpO2:  [92 %-100 %] 100 % (11/03 1744) FiO2 (%):  [40 %-60 %] 40 % (11/03 1814) Weight:  [82.555 kg (182 lb)] 82.555 kg (182 lb) (11/03 1208) HEMODYNAMICS:   VENTILATOR SETTINGS: Vent Mode:  [-] PRVC FiO2 (%):  [40 %-60 %] 40 % Set Rate:  [12 bmp] 12 bmp  Vt Set:  [530 mL] 530 mL PEEP:  [5 cmH20] 5 cmH20 Plateau Pressure:  [11 [id number] cmH20] 28 cmH20 INTAKE / OUTPUT:  Intake/Output Summary (Last 24 hours) at 11/10/13 1824 Last data filed at 11/10/13 1515  Gross per 24 hour  Intake    750 ml  Output      0 ml  Net    750 ml    PHYSICAL EXAMINATION: General:  Somnolent, moving spontaneously Neuro:  Stirs to touch, reaches for tube HEENT:  NCAT, EOMi, ETT in place Cardiovascular:  RRR, systolic murmur Lungs:  CTA B Abdomen:  BS +, soft, nontender Musculoskeletal:  Normal bulk and tone Skin:  Left upper arm wound dressed (C/D/I), no other skin breakdown  LABS:  CBC  Recent Labs Lab 11/10/13 1211  HGB 12.2*  HCT 36.0*   Coag's No results for input(s): APTT, INR in the last 168  hours. BMET  Recent Labs Lab 11/10/13 1211  NA 140  K 4.7  GLUCOSE 89   Electrolytes No results for input(s): CALCIUM, MG, PHOS in the last 168 hours. Sepsis Markers No results for input(s): LATICACIDVEN, PROCALCITON, O2SATVEN in the last 168 hours. ABG No results for input(s): PHART, PCO2ART, PO2ART in the last 168 hours. Liver Enzymes No results for input(s): AST, ALT, ALKPHOS, BILITOT, ALBUMIN in the last 168 hours. Cardiac Enzymes No results for input(s): TROPONINI, PROBNP in the last 168 hours. Glucose  Recent Labs Lab 11/10/13 1523 11/10/13 1543  GLUCAP 98 103*    Imaging No results found.   ASSESSMENT / PLAN:  PULMONARY OETT 11/3 >> A:  On ventilator post op due to encephalopathy from anesthesia meds Restrictive lung disease, uncertain etiology, no ILD Asthma? (prior tobacco, no obstruction on PFT) P:   Wean vent now> pressure support Stop sedation Plan extubation tonight Prn albuterol  CARDIOVASCULAR CVL none A:   CHF, not in exacerbation Pulmonary hypertension, WHO class 2 (10/2013 [facility] with PCWP of 32) P:  Resume home meds in morning (labetalol, amlodipine) Tele  RENAL A:  ESRD P:   May need to call renal in AM BMET in AM  GASTROINTESTINAL A:  No acute issues P:   npo  HEMATOLOGIC A:  No acute issues P:  Monitor for bleeding Cbc intermittently  INFECTIOUS A:  No acute issues P:   Monitor for fever  ENDOCRINE A:  DM2 P:   POC Glucose q4h and SSI  NEUROLOGIC A:  Acute encephalopathy post operatively most likely due to medications from anesthesia in setting of ESRD P:   RASS goal: n/a, plan to extubate Fentanyl prn pain   FAMILY  - Updates:      TODAY'S SUMMARY: Sedated post operatively, remained intubated.  Now waking up. Plan extubation    Roselie Awkward, MD [facility] Pager: [phone] Cell: [phone] If no response, call [phone]  11/10/2013, 6:24 PM

## 2013-11-10 NOTE — Anesthesia Postprocedure Evaluation (Signed)
  Anesthesia Post-op Note  Patient: Nathaniel White  Procedure(s) Performed: Procedure(s): FISTULA PLICATION (Left)  Patient Location: PACU  Anesthesia Type:General  Level of Consciousness: sedated  Airway and Oxygen Therapy: Patient placed on Ventilator (see vital sign flow sheet for setting)  Post-op Pain: none  Post-op Assessment: Post-op Vital signs reviewed. Pt with previous hx of stroke brought to PACU intubated as unable to follow commands and respond appropriately following GETA for fistula plication. Muscle relaxant reversed, anesthetic gases undetectable, narcan given for opioid reversal, BG checked and 98 in OR. Pt still unable to follow commands in PACU. Moves all ext x4, pupils equal and responsive. ICU bed made available for prolonged ventilatory wean. Dr Trula Slade made aware of situation and will admit to service.  Post-op Vital Signs: Reviewed  Last Vitals:  Filed Vitals:   11/10/13 1814  BP: 129/81  Pulse: 101  Temp:   Resp:     Complications: unexpected post-op hospitalization

## 2013-11-10 NOTE — Transfer of Care (Signed)
Immediate Anesthesia Transfer of Care Note  Patient: Nathaniel White  Procedure(s) Performed: Procedure(s): FISTULA PLICATION (Left)  Patient Location: PACU  Anesthesia Type:General  Level of Consciousness: unresponsive and responds to stimulation  Airway & Oxygen Therapy: Patient placed on Ventilator (see vital sign flow sheet for setting)  Post-op Assessment: Report given to PACU RN and Post -op Vital signs reviewed and stable  Post vital signs: Reviewed and stable  Complications: respiratory complications

## 2013-11-10 NOTE — Discharge Instructions (Signed)
What to eat: ° °For your first meals, you should eat lightly; only small meals initially.  If you do not have nausea, you may eat larger meals.  Avoid spicy, greasy and heavy food.   ° °General Anesthesia, Adult, Care After  °Refer to this sheet in the next few weeks. These instructions provide you with information on caring for yourself after your procedure. Your health care provider may also give you more specific instructions. Your treatment has been planned according to current medical practices, but problems sometimes occur. Call your health care provider if you have any problems or questions after your procedure.  °WHAT TO EXPECT AFTER THE PROCEDURE  °After the procedure, it is typical to experience:  °Sleepiness.  °Nausea and vomiting. °HOME CARE INSTRUCTIONS  °For the first 24 hours after general anesthesia:  °Have a responsible person with you.  °Do not drive a car. If you are alone, do not take public transportation.  °Do not drink alcohol.  °Do not take medicine that has not been prescribed by your health care provider.  °Do not sign important papers or make important decisions.  °You may resume a normal diet and activities as directed by your health care provider.  °Change bandages (dressings) as directed.  °If you have questions or problems that seem related to general anesthesia, call the hospital and ask for the anesthetist or anesthesiologist on call. °SEEK MEDICAL CARE IF:  °You have nausea and vomiting that continue the day after anesthesia.  °You develop a rash. °SEEK IMMEDIATE MEDICAL CARE IF:  °You have difficulty breathing.  °You have chest pain.  °You have any allergic problems. °Document Released: 04/02/2000 Document Revised: 08/27/2012 Document Reviewed: 07/10/2012  °ExitCare® Patient Information ©2014 [employer].  ° °Tissue Adhesive Wound Care  ° ° °Some cuts and wounds can be closed with tissue adhesive. Adhesive is like glue. It holds the skin together and helps a wound heal faster.  This adhesive goes away on its own as the wound heals.  °HOME CARE  °Showers are allowed. Do not soak the wound in water. Do not take baths, swim, or use hot tubs. Do not use soaps or creams on your wound.  °If a bandage (dressing) was put on, change it as often as told by your doctor.  °Keep the bandage dry.  °Do not scratch, pick, or rub the adhesive.  °Do not put tape over the adhesive. The adhesive could come off.  °Protect the wound from another injury.  °Protect the wound from sun and tanning beds.  °Only take medicine as told by your doctor.  °Keep all doctor visits as told. °GET HELP RIGHT AWAY IF:  °Your wound is red, puffy (swollen), hot, or tender.  °You get a rash after the glue is put on.  °You have more pain in the wound.  °You have a red streak going away from the wound.  °You have yellowish-white fluid (pus) coming from the wound.  °You have more bleeding.  °You have a fever.  °You have chills and start to shake.  °You notice a bad smell coming from the wound.  °Your wound or adhesive breaks open. °MAKE SURE YOU:  °Understand these instructions.  °Will watch your condition.  °Will get help right away if you are not doing well or get worse. °Document Released: 10/04/2007 Document Revised: 10/15/2012 Document Reviewed: 07/16/2012  °ExitCare® Patient Information ©2015 [employer]. This information is not intended to replace advice given to you by your health care provider.   Make sure you discuss any questions you have with your health care provider.  ° ° °

## 2013-11-10 NOTE — Telephone Encounter (Addendum)
-----   Message from Mena Goes, RN sent at 11/10/2013  2:52 PM EST ----- Regarding: Schedule   ----- Message -----    From: Alvia Grove, PA-C    Sent: 11/10/2013   2:49 PM      To: [facility] Charge Pool  S/p plication of left upper arm AVF 11/10/13  F/u in 3 weeks with Dr. Trula Slade  Thanks, Kim  11/10/13: voicemail is full, mailed pw, dpm

## 2013-11-10 NOTE — Procedures (Signed)
Extubation Procedure Note  Patient Details:   Name: Nathaniel White DOB: 11-24-1960 MRN: [id number]   Airway Documentation:     Evaluation  O2 sats: stable throughout Complications: No apparent complications Patient did tolerate procedure well. Bilateral Breath Sounds: Rales   Yes   Patient extubated to 6L nasal cannula.  Positive cuff leak.  No evidence of stridor.  Patient was able to speak post extubation however still sleepy.  Sats currently 96%.  Vitals are stable.  RT will continue to monitor.   Philomena Doheny 11/10/2013, 6:54 PM

## 2013-11-10 NOTE — Interval H&P Note (Signed)
History and Physical Interval Note:  11/10/2013 12:26 PM  Nathaniel White  has presented today for surgery, with the diagnosis of End Stage Renal Disease N18.6  The various methods of treatment have been discussed with the patient and family. After consideration of risks, benefits and other options for treatment, the patient has consented to  Procedure(s): FISTULA PLICATION (Left) as a surgical intervention .  The patient's history has been reviewed, patient examined, no change in status, stable for surgery.  I have reviewed the patient's chart and labs.  Questions were answered to the patient's satisfaction.     BRABHAM IV, V. WELLS

## 2013-11-10 NOTE — Progress Notes (Signed)
RT note:  Pt called to PACU per Dr. Ola Spurr with anesthesia to place pt on CPAP/PS. After placing pt on 10/5 PS/CPAP, pt increased RR in high 40's to mid 50's and increased HR 145. Dr. Ola Spurr asked for pt to be placed back on full support to rest. Pt still with high rate but has settled down some to RR of 36. RT awaiting further orders.

## 2013-11-10 NOTE — Anesthesia Procedure Notes (Addendum)
Procedure Name: LMA Insertion Date/Time: 11/10/2013 1:11 PM Performed by: Ollen Bowl Pre-anesthesia Checklist: Patient identified, Emergency Drugs available, Suction available, Patient being monitored and Timeout performed Patient Re-evaluated:Patient Re-evaluated prior to inductionOxygen Delivery Method: Circle system utilized Preoxygenation: Pre-oxygenation with 100% oxygen Intubation Type: IV induction Ventilation: Mask ventilation without difficulty LMA: LMA inserted LMA Size: 5.0 Number of attempts: 1 Placement Confirmation: positive ETCO2,  CO2 detector and breath sounds checked- equal and bilateral Tube secured with: Tape   Procedure Name: Intubation Date/Time: 11/10/2013 1:33 PM Performed by: Ollen Bowl Pre-anesthesia Checklist: Patient identified, Emergency Drugs available, Suction available, Patient being monitored and Timeout performed Patient Re-evaluated:Patient Re-evaluated prior to inductionOxygen Delivery Method: Circle system utilized and Simple face mask Preoxygenation: Pre-oxygenation with 100% oxygen Intubation Type: Inhalational induction Grade View: Grade I Tube type: Oral Tube size: 7.5 mm Number of attempts: 1 Airway Equipment and Method: Patient positioned with wedge pillow,  Stylet and Video-laryngoscopy Placement Confirmation: ETT inserted through vocal cords under direct vision,  positive ETCO2 and breath sounds checked- equal and bilateral Secured at: 22 cm Tube secured with: Tape Dental Injury: Teeth and Oropharynx as per pre-operative assessment

## 2013-11-10 NOTE — Anesthesia Preprocedure Evaluation (Addendum)
Anesthesia Evaluation  Patient identified by MRN, date of birth, ID band Patient awake    Reviewed: Allergy & Precautions, H&P , NPO status , Patient's Chart, lab work & pertinent test results, reviewed documented beta blocker date and time   Airway Mallampati: II  TM Distance: >3 FB Neck ROM: Full    Dental  (+) Dental Advisory Given   Pulmonary asthma , COPD COPD inhaler, former smoker,  breath sounds clear to auscultation        Cardiovascular hypertension, Pt. on home beta blockers and Pt. on medications + CAD, + Peripheral Vascular Disease and +CHF Rhythm:Regular Rate:Normal  EF 40-45%. Moderate MR.   Neuro/Psych Anxiety CVA    GI/Hepatic negative GI ROS, Neg liver ROS,   Endo/Other  diabetes, Well Controlled  Renal/GU ESRFRenal disease     Musculoskeletal   Abdominal   Peds  Hematology  (+) anemia ,   Anesthesia Other Findings   Reproductive/Obstetrics                            Anesthesia Physical Anesthesia Plan  ASA: III  Anesthesia Plan: General   Post-op Pain Management:    Induction: Intravenous  Airway Management Planned: LMA  Additional Equipment:   Intra-op Plan:   Post-operative Plan:   Informed Consent: I have reviewed the patients History and Physical, chart, labs and discussed the procedure including the risks, benefits and alternatives for the proposed anesthesia with the patient or authorized representative who has indicated his/her understanding and acceptance.   Dental advisory given  Plan Discussed with: CRNA and Surgeon  Anesthesia Plan Comments:        Anesthesia Quick Evaluation

## 2013-11-10 NOTE — Progress Notes (Signed)
[facility] Progress Note Patient Name: Nathaniel White DOB: Jun 03, 1960 MRN: [id number]   Date of Service  11/10/2013  HPI/Events of Note  Severe agitation Haldol not working- qT prolonged  eICU Interventions  precedex gtt     Intervention Category Major Interventions: Delirium, psychosis, severe agitation - evaluation and management  ALVA,RAKESH V. 11/10/2013, 7:51 PM

## 2013-11-10 NOTE — Progress Notes (Signed)
Pt admitted to 2s room 9.  Pt sedated, report taken from [facility].  Pt responds to painful stimuli.  Md told of arrival and RT at the bedside.  No family or SO at this time, will continue to check waiting room.  Pt placed on monitor.  Will continue to monitor.

## 2013-11-10 NOTE — Progress Notes (Signed)
Pt not alert enough to be extubated.  He did get a lot of medication intra-op to keep him comfortable.  He was converted from LMA to ETT for paralysis as he could not be kept still with the LAM.  I suspect with his medical co-morbidities, he is just slow to clear the anesthesia.  He is moving all extremities.  I updated his wife via telephone and explained the situation to her.  He will be transferred to the ICU with hopeful extubation later tonight.   Annamarie Major

## 2013-11-10 NOTE — Op Note (Signed)
    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 Blood Loss:  See anesthesia record Specimens:   none  Findings:  Aneurysmal fistula treated with aneurysmorrhaphy.  I also resected skin.  Indications:  The patient has had bleeding from a open area from his fistula which was treated locally.  He also has a large aneurysm.  I have elected to proceed with aneurysmorrhaphy to resect the skin lesion as well as to reduce the size was aneurysm.  There is another aneurysm that is not going to be treated time, so I do not have to place a catheter  Procedure:  The patient was identified in the holding area and taken to [facility] [address]  The patient was then placed supine on the table. general anesthesia was administered.  The patient was prepped and draped in the usual sterile fashion.  A time out was called and antibiotics were administered.  An incision was made directly anterior to the fistula.  I used cautery to dissect out the fistula.  It was aneurysmal however just above and just below the aneurysm and tapered back to a normal diameter.  There was a buttonhole in the skin which tracked down to the vein.  Once I had the fistula fully mobilized, I marked the anterior surface.  3000 units of heparin were given.  The fistula was occluded.  The fistula was then opened longitudinally.  I resected approximately 50% of the aneurysm.  When I did this fistula was very redundant, therefore I transected the posterior wall of the fistula and then resected an additional 3 cm to make this less redundant.  The backwall was then sewn together with a running 5-0 Prolene.  I then closed the anterior wall of the aneurysm with running 5-0  Prolene.  Several repair stitches were required for hemostasis.  There was an excellent thrill within the fistula.25 mg of protamine was administered  I then resected the discolored skin which had ulcerations within it.  I resected approximately 1 cm x 8 cm of skin.  I then reapproximated the subcutaneous tissue with 3-0 Vicryl and the skin was closed with a running 4-0 nylon.sterile dressings were applied   Disposition:  To PACU in stable condition.   Theotis Burrow, M.D. [facility] Office: [phone] Pager:  [phone]

## 2013-11-10 NOTE — Progress Notes (Signed)
Patient transported to room 2S09 without any complications.  MD ordered for Propofol to be turned off and once patient began to wake up, place on CPAP/PSV.  Patient tolerated CPAP/PSV however was still too tired to follow commands.  Per MD order extubated patient to nasal cannula.  Patient was able to speak after extubation and answered questions that he felt better.  Vitals are stable.  RT will continue to monitor.

## 2013-11-10 NOTE — H&P (View-Only) (Signed)
[facility] Progress Note   CC:  Non-healing wound over left upper arm AVF Advani, Deepak, MD  HPI: This is a 53 y.o. male who presents today with eschar of left upper arm AVF for one week. He denies any bleeding with the wound. He denies any pain or swelling. He dialyzes on TTS and denies any issues with dialysis. He was seen by Dr. Donnetta Hutching on 08/25/13 for bleeding and ulceration from the same area.  He was taken to the OR for excision of eroded skin and exploration of his left upper arm AV fistula. The eschar was removed and it did not appear to affect the vein.   Past Medical History  Diagnosis Date  . Hypertension   . ESRD     on HD  . Anemia   . Stroke   . Hemiparesis due to old stroke     left  . LV dysfunction     EF 25-30% by echo 07/2011  . MR (mitral regurgitation)     moderate to severe, echo 07/2011  . Shortness of breath   . Diabetes mellitus without complication   . CHF (congestive heart failure)   . COPD (chronic obstructive pulmonary disease)   . Tobacco abuse    Past Surgical History  Procedure Laterality Date  . Kidney transplant      2011 rejected kidney 2012 back on dialysis  . Av fistula placement    . Cardiac catheterization      [place] without cardioversion  08/17/2011    Procedure: TRANSESOPHAGEAL ECHOCARDIOGRAM (TEE);  Surgeon: Larey Dresser, MD;  Location: [facility];  Service: Cardiovascular;  Laterality: N/A;  . [place] of arteriovenous fistula Left 08/26/2013    Procedure: EXCISION OF ERODED SKIN AND EXPLORATION OF MAIN LEFT UPPER ARM AV FISTULA;  Surgeon: Rosetta Posner, MD;  Location: [facility];  Service: Vascular;  Laterality: Left;    Allergies  Allergen Reactions  . Codeine Shortness Of Breath    Current Outpatient Prescriptions  Medication Sig Dispense Refill  . albuterol (PROVENTIL HFA;VENTOLIN HFA) 108 (90 BASE) MCG/ACT inhaler Inhale 1-2 puffs into the lungs every 6 (six) hours as needed for  wheezing.  1 Inhaler  0  . amLODipine (NORVASC) 10 MG tablet Take 10 mg by mouth every evening.      . Fluticasone-Salmeterol (ADVAIR DISKUS) 250-50 MCG/DOSE AEPB Inhale 1 puff into the lungs 2 (two) times daily.  60 each  5  . labetalol (NORMODYNE) 300 MG tablet Take 150 mg by mouth 2 (two) times daily.       No current facility-administered medications for this visit.    Family History  Problem Relation Age of Onset  . Hypertension Mother   . Varicose Veins Mother   . Diabetes Paternal Grandmother   . Cancer Paternal Grandfather   . CAD Paternal Uncle     History   Social History  . Marital Status: Single    Spouse Name: N/A    Number of Children: 1  . Years of Education: N/A   Occupational History  . Retired-[employer]    Social History Main Topics  . Smoking status: Former Smoker -- 0.50 packs/day for 32 years    Types: Cigarettes    Quit date: 10/13/2012  . Smokeless tobacco: Never Used  . Alcohol Use: No  . Drug Use: No  . Sexual Activity: Not on file   Other Topics Concern  . Not on file  Social History Narrative  . No narrative on file     PHYSICAL EXAMINATION:  Filed Vitals:   11/02/13 1454  BP: 142/95  Pulse: 99  Temp: 98.1 F (36.7 C)  Resp: 16   Body mass index is 26.86 kg/(m^2).  General:  WDWN in NAD Lungs: non-labored breathing Vascular: 33mm area of dry eschar over left upper arm fistula. Aneurysm of middle and upper segments of fistula. Palpable thrill and audible bruit present. Palpable left radial pulse.  Extremities: no ischemic changes,  sensation intact.  Musculoskeletal: 5/5 strength LUE  ASSESSMENT: 53 y.o. male with ESRD on HD TTS with aneurysmal degeneration of left upper arm AVF and ulceration  PLAN: Previous multiple episodes of ulceration with fistula. His left upper arm fistula aneurysms have been stable for the past year, but there is concern for continuing degeneration.  He will be scheduled for plication of his  left upper arm AV fistula on 11/10/13 with Dr. Trula Slade.    Virgina Jock, PA-C [facility] [phone]  Clinic MD:  Pt seen and examined in conjunction with Dr. Trula Slade.   I agree with the above.  I have seen and examined the patient.  He has had recurrence of the ulcer in his left arm fistula.  There are 2 large aneurysmal sections.  Because this area has recurred, I think he needs to proceed with aneurysmorrhaphy and resection of the lesion.  This was scheduled for earlier this year but the patient did not follow through.  He has a history of central venous stenosis which has been dilated historically.  I have scheduled him for aneurysmorrhaphy and resection of the ulcerated area this coming Tuesday, November 3

## 2013-11-10 NOTE — Progress Notes (Signed)
eLink Physician-Brief Progress Note Patient Name: Nathaniel White DOB: Jan 24, 1960 MRN: [id number]   Date of Service  11/10/2013  HPI/Events of Note  ESRD, s/p elective plication of aneurym over fistula, slow to wake up Vitals ok, camera chk -on propofol gtt  eICU Interventions  Goal RASS 0 Vent orders given Plan to Extubate if wakes up fully   New ICU patient evaluation: This patient was evaluated by the [facility] team. I have reviewed relevant documentation including care plan & orders.   Intervention Category Evaluation Type: New Patient Evaluation  ALVA,RAKESH V. 11/10/2013, 6:23 PM

## 2013-11-11 ENCOUNTER — Encounter (HOSPITAL_COMMUNITY): Payer: Self-pay | Admitting: Surgery

## 2013-11-11 ENCOUNTER — Ambulatory Visit: Payer: Medicare Other | Admitting: Cardiovascular Disease

## 2013-11-11 DIAGNOSIS — J45901 Unspecified asthma with (acute) exacerbation: Secondary | ICD-10-CM

## 2013-11-11 DIAGNOSIS — J984 Other disorders of lung: Secondary | ICD-10-CM

## 2013-11-11 LAB — GLUCOSE, CAPILLARY
GLUCOSE-CAPILLARY: 148 mg/dL — AB (ref 70–99)
Glucose-Capillary: 95 mg/dL (ref 70–99)
Glucose-Capillary: 74 mg/dL (ref 70–99)
Glucose-Capillary: 115 mg/dL — ABNORMAL HIGH (ref 70–99)

## 2013-11-11 MED ORDER — DEXTROSE 50 % IV SOLN
25.0000 mL | Freq: Once | INTRAVENOUS | Status: AC | PRN
  Administered 2013-11-11: 25 mL via INTRAVENOUS
  Filled 2013-11-11: qty 50

## 2013-11-11 NOTE — Progress Notes (Signed)
Pt's O2 level remained above 88% and higher on room air with no distress for the past 3 hours.  Ok to discharge per [place]. Nurse discussed discharged paperwork with pt.

## 2013-11-11 NOTE — Progress Notes (Signed)
Hypoglycemic Event  CBG: 53  Treatment: D50 IV 25 mL  Symptoms: None  Follow-up CBG: Time:0418 CBG Result:97  Possible Reasons for Event: Inadequate meal intake    Nathaniel White  Remember to initiate Hypoglycemia Order Set & complete

## 2013-11-11 NOTE — Progress Notes (Signed)
  [facility] Progress Note  11/11/2013 2:18 PM  Patient ambulated in halls on room air with 02 sats 88%-100%. Denies any shortness of breath. Will discharge home.    Virgina Jock, PA-C [facility] Office: [phone] Pager: [phone] 11/11/2013 2:18 PM

## 2013-11-11 NOTE — Progress Notes (Addendum)
PULMONARY / CRITICAL CARE MEDICINE   Name: Nathaniel White MRN: [id number] DOB: [birth year]    ADMISSION DATE:  11/10/2013 CONSULTATION DATE:  11/10/2013  REFERRING MD :  Trula Slade  CHIEF COMPLAINT:  Acute encephalopathy post op  INITIAL PRESENTATION:  53 yo male admitted for elective left upper arm plication and anuerysm resection of left upper arm fistula.  He remained on vent post-op and PCCM consulted for vent management.  STUDIES:    SIGNIFICANT EVENTS: [id number]  plication and anuerysm resection of left upper arm fistula  SUBJECTIVE:   VITAL SIGNS: Temp:  [96.2 F (35.7 C)-98.9 F (37.2 C)] 97.7 F (36.5 C) (11/04 0753) Pulse Rate:  [32-143] 69 (11/04 0700) Resp:  [12-34] 15 (11/04 0700) BP: (90-188)/(50-127) 111/70 mmHg (11/04 0700) SpO2:  [91 %-100 %] 95 % (11/04 0700) FiO2 (%):  [40 %-60 %] 40 % (11/03 1814) Weight:  [182 lb (82.555 kg)] 182 lb (82.555 kg) (11/03 1208) VENTILATOR SETTINGS: Vent Mode:  [-] PRVC FiO2 (%):  [40 %-60 %] 40 % Set Rate:  [12 bmp] 12 bmp Vt Set:  [530 mL] 530 mL PEEP:  [5 cmH20] 5 cmH20 Plateau Pressure:  [11 [id number] cmH20] 28 cmH20 INTAKE / OUTPUT:  Intake/Output Summary (Last 24 hours) at 11/11/13 0801 Last data filed at 11/11/13 0700  Gross per 24 hour  Intake  995.9 ml  Output      0 ml  Net  995.9 ml    PHYSICAL EXAMINATION: General:  Somnolent, moving spontaneously Neuro:  Stirs to touch, reaches for tube HEENT:  NCAT, EOMi, ETT in place Cardiovascular:  RRR, systolic murmur Lungs:  CTA B Abdomen:  BS +, soft, nontender Musculoskeletal:  Normal bulk and tone Skin:  Left upper arm wound dressed (C/D/I), no other skin breakdown  LABS:  CBC  Recent Labs Lab 11/10/13 1211 11/10/13 2135  WBC  --  4.4  HGB 12.2* 9.5*  HCT 36.0* 29.9*  PLT  --  87*   Coag's No results for input(s): APTT, INR in the last 168 hours. BMET  Recent Labs Lab 11/10/13 1211 11/10/13 2135  NA 140 145  K 4.7 4.7  CL  --  103  CO2  --   23  BUN  --  46*  CREATININE  --  9.02*  GLUCOSE 89 78   Electrolytes  Recent Labs Lab 11/10/13 2135  CALCIUM 9.3   Glucose  Recent Labs Lab 11/10/13 1523 11/10/13 1543 11/10/13 2048 11/10/13 2335 11/11/13 0421  GLUCAP 98 103* 78 73 95    Imaging No results found.   ASSESSMENT / PLAN:  PULMONARY OETT 11/3 >> 11/03 A:  Restrictive lung disease, uncertain etiology, no ILD Asthma? (prior tobacco, no obstruction on PFT) P:   F/u as outpt with Dr. Lake Bells.  CARDIOVASCULAR A:   CHF, not in exacerbation Pulmonary hypertension, WHO class 2 (10/2013 [facility] with PCWP of 32) P:  Resume home meds in morning (labetalol, amlodipine) Tele  RENAL A:   ESRD P:   Per primary team and renal  ENDOCRINE A:   DM2 P:   Per primary team  NEUROLOGIC A:   Acute encephalopathy post operatively most likely due to medications from anesthesia in setting of ESRD >> resolved 11/04. P:   Monitor    Stable from pulmonary standpoint for d/c home.  Will arrange for outpt follow up with Dr. Lake Bells >> set up for 12/01/13 at 9:15 am.  Chesley Mires, MD [place] 11/11/2013, 8:10 AM Pager:  [phone] After 3pm call: [phone]

## 2013-11-11 NOTE — Discharge Summary (Signed)
[facility] Discharge Summary  Nathaniel White Jan 07, 1961 53 y.o. male  [id number]  Admission Date: 11/10/2013  Discharge Date: 11/11/2013  Physician: Serafina Mitchell, MD  Admission Diagnosis: End Stage Renal Disease N18 6   HPI:   This is a 53 y.o. male with ESRD on HD TTS who presented for evaluation of ulcer of left upper arm AV fistula. He had two large aneurysmal sections of his fistula. He denies any bleeding or complications with dialysis. He has a history of central venous stenosis which has been dilated historically.  He has a previous history of ulcerations of his fistula.  He was scheduled earlier this year for aneurysmorrhaphy and resection of the ulceration but did not follow through. Due to reoccurrence of his ulceration, he was scheduled for aneurysmorrhaphy on 11/10/13.  He has a past medical history of CVA with moderate residual left sided weakness, diabetes mellitus, hypertension, tobacco abuse, CHF and right chronic pleural effusion.   He is currently on labetatol for hypertension. His diabetes is controlled without medication.   Hospital Course:  The patient was admitted to the hospital and taken to the operating room on 11/10/2013 and underwent: plication and aneurysm resection of left upper arm fistula. He required additional medication intraoperatively to keep him comfortable and was converted from LMA to ETT for paralysis. He could not be kept still with LMA. The case was uncomplicated. Post-operatively, the patient was not alert enough to be extubated. It was suspected that he was slow to clear the anesthesia due to his ESRD.  He was kept intubated and transferred to ICU. PCCM was consulted for assistance with vent management.   The patient was extubated around 1900 on day of surgery. His vital signs were stable.   On POD 1, he denied any shortness of breath. Nephrology was called regarding his dialysis. He previously dialyzed on Monday 11/09/13.  His normal schedule is TTS. Given that his labs were stable and he was asymptomatic, he could resume dialysis on Thursday 11/12/13. His left upper arm incision was clean with sutures intact. There was a good palpable thrill. He was weaned off of oxygen. By 1400 that afternoon, he continued to deny having shortness of breath and was able to ambulate in the halls with 02 sats between 88-100. He was discharged home on POD 1 in good condition.    CBC    Component Value Date/Time   WBC 4.4 11/10/2013 2135   RBC 3.32* 11/10/2013 2135   HGB 9.5* 11/10/2013 2135   HCT 29.9* 11/10/2013 2135   PLT 87* 11/10/2013 2135   MCV 90.1 11/10/2013 2135   MCH 28.6 11/10/2013 2135   MCHC 31.8 11/10/2013 2135   RDW 17.2* 11/10/2013 2135   LYMPHSABS 0.8 10/06/2013 0140   MONOABS 0.5 10/06/2013 0140   EOSABS 0.2 10/06/2013 0140   BASOSABS 0.1 10/06/2013 0140    BMET    Component Value Date/Time   NA 145 11/10/2013 2135   K 4.7 11/10/2013 2135   CL 103 11/10/2013 2135   CO2 23 11/10/2013 2135   GLUCOSE 78 11/10/2013 2135   BUN 46* 11/10/2013 2135   CREATININE 9.02* 11/10/2013 2135   CALCIUM 9.3 11/10/2013 2135   GFRNONAA 6* 11/10/2013 2135   GFRAA 7* 11/10/2013 2135     Discharge Instructions:   The patient is discharged to home with extensive instructions on wound care and progressive ambulation.  They are instructed not to drive or perform any heavy lifting until returning to  see the physician in his office.  Discharge Instructions    Call MD for:  redness, tenderness, or signs of infection (pain, swelling, bleeding, redness, odor or green/yellow discharge around incision site)    Complete by:  As directed      Call MD for:  severe or increased pain, loss or decreased feeling  in affected limb(s)    Complete by:  As directed      Call MD for:  temperature >100.5    Complete by:  As directed      Driving Restrictions    Complete by:  As directed   No driving while on pain medication      Lifting restrictions    Complete by:  As directed   No lifting restrictions     Resume previous diet    Complete by:  As directed      may wash over wound with mild soap and water    Complete by:  As directed            Discharge Diagnosis:  End Stage Renal Disease N18 6  Secondary Diagnosis: Patient Active Problem List   Diagnosis Date Noted  . Hemodialysis AV fistula aneurysm 11/10/2013  . Complication from renal dialysis device 11/02/2013  . Hyperkalemia 10/06/2013  . Cough 09/07/2013  . CHF, systolic dysfunction [id number]  . Other complications due to renal dialysis device, implant, and graft 10/27/2012  . Cerebral embolism with cerebral infarction 08/13/2011  . Shortness of breath 03/13/2011  . ESRD (end stage renal disease) on dialysis 03/13/2011  . HTN (hypertension) 03/13/2011  . H/O: CVA (cardiovascular accident) 03/13/2011  . Pleural effusion, right chronic 03/13/2011  . Anemia 03/13/2011  . Fluid overload 03/13/2011  . History of nonadherence to medical treatment 03/13/2011  . DM (diabetes mellitus) 03/13/2011  . Tobacco abuse 03/13/2011  . Essential hypertension, benign 10/04/2008  . ESOPHAGEAL VARICES 10/04/2008  . HEMATOCHEZIA 10/04/2008  . End stage renal disease 10/04/2008   Past Medical History  Diagnosis Date  . Hypertension   . Anemia   . Hemiparesis due to old stroke     left  . LV dysfunction     EF 25-30% by echo 07/2011  . MR (mitral regurgitation)     moderate to severe, echo 07/2011  . Shortness of breath   . CHF (congestive heart failure)   . COPD (chronic obstructive pulmonary disease)   . Tobacco abuse   . Stroke     TIA's  . Asthma   . Diabetes mellitus without complication     not on medications  . Anxiety   . ESRD     on HD, M- W- F   . Memory loss due to medical condition     due to stroke       Medication List    TAKE these medications        albuterol 108 (90 BASE) MCG/ACT inhaler  Commonly known as:  PROVENTIL  HFA;VENTOLIN HFA  Inhale 1-2 puffs into the lungs every 6 (six) hours as needed for wheezing.     Fluticasone-Salmeterol 250-50 MCG/DOSE Aepb  Commonly known as:  ADVAIR  Inhale 2 puffs into the lungs daily at 12 noon.     labetalol 300 MG tablet  Commonly known as:  NORMODYNE  Take 300 mg by mouth 2 (two) times daily.     traMADol 50 MG tablet  Commonly known as:  ULTRAM  Take 1 tablet (50 mg total) by mouth every 6 (  six) hours as needed.        Tramadol  #20 No Refill  Disposition: Home  Patient's condition: is Good  Follow up: 1. Dr. Trula Slade in 3 weeks 2. Dr. Lake Bells on 12/01/13    Virgina Jock, PA-C [facility] [phone] 11/11/2013  2:38 PM

## 2013-11-11 NOTE — Progress Notes (Signed)
If pt can wean off O2 today and reevaluated by the PA afterwards, ok to be discharged per MD Brabham.

## 2013-11-11 NOTE — Progress Notes (Addendum)
  [facility] Progress Note  11/11/2013 8:14 AM 1 Day Post-Op Subjective: Doing well this morning. Denies shortness of breath and chest pain.   Filed Vitals:   11/11/13 0753  BP:   Pulse:   Temp: 97.7 F (36.5 C)  Resp:     Physical Exam: General: Alert and oriented to person and place. Oriented to time with prompting. In no acute distress.  Incisions:  Left upper arm incision clean with sutures intact.  Extremities:  Left upper arm contracted. Palpable thrill left upper arm fistula. 5/5 strength RUE, 4/5 LUE  CBC    Component Value Date/Time   WBC 4.4 11/10/2013 2135   RBC 3.32* 11/10/2013 2135   HGB 9.5* 11/10/2013 2135   HCT 29.9* 11/10/2013 2135   PLT 87* 11/10/2013 2135   MCV 90.1 11/10/2013 2135   MCH 28.6 11/10/2013 2135   MCHC 31.8 11/10/2013 2135   RDW 17.2* 11/10/2013 2135   LYMPHSABS 0.8 10/06/2013 0140   MONOABS 0.5 10/06/2013 0140   EOSABS 0.2 10/06/2013 0140   BASOSABS 0.1 10/06/2013 0140    BMET    Component Value Date/Time   NA 145 11/10/2013 2135   K 4.7 11/10/2013 2135   CL 103 11/10/2013 2135   CO2 23 11/10/2013 2135   GLUCOSE 78 11/10/2013 2135   BUN 46* 11/10/2013 2135   CREATININE 9.02* 11/10/2013 2135   CALCIUM 9.3 11/10/2013 2135   GFRNONAA 6* 11/10/2013 2135   GFRAA 7* 11/10/2013 2135    INR    Component Value Date/Time   INR 1.0 10/08/2013 1353     Intake/Output Summary (Last 24 hours) at 11/11/13 0814 Last data filed at 11/11/13 0700  Gross per 24 hour  Intake  995.9 ml  Output      0 ml  Net  995.9 ml     Assessment:  53 y.o. male is s/p: plication and aneurysm resection of left upper arm fistula 1 Day Post-Op  Plan: -Extubated yesterday evening around 1900.  -Post operative acute encephalopathy: Had severe agitation last night. Likely from anesthesia in setting of ESRD. Has now resolved.  -ESRD:  Labs are stable and he is asymptomatic. Contacted renal this morning. Will plan to resume regular  dialysis schedule tomorrow.  -Resume diet, OOB.  -Dispo: Will discharge home today. Dr. Trula Slade to see patient.  -Appreciate CCM assistance.     Virgina Jock, PA-C [facility] Office: [phone] Pager: [phone] 11/11/2013 8:14 AM     Agree with the above If can wean off O2, can be d/c home Good thrill in AVF  WElls Leilanee Righetti

## 2013-11-11 NOTE — Progress Notes (Signed)
ANTIBIOTIC CONSULT NOTE - INITIAL  Pharmacy Consult for renal dose antibiotics Indication: post op prophylaxis  Allergies  Allergen Reactions  . Codeine Shortness Of Breath    Patient Measurements: Height: 5\' 9"  (175.3 cm) Weight: 182 lb (82.555 kg) IBW/kg (Calculated) : 70.7   Vital Signs: Temp: 97.7 F (36.5 C) (11/04 0753) Temp Source: Oral (11/04 0753) BP: 120/75 mmHg (11/04 1000) Pulse Rate: 84 (11/04 1000) Intake/Output from previous day: 11/03 0701 - 11/04 0700 In: 995.9 [I.V.:945.9; IV Piggyback:50] Out: 0  Intake/Output from this shift: Total I/O In: 20 [I.V.:20] Out: -   Labs:  Recent Labs  11/10/13 1211 11/10/13 2135  WBC  --  4.4  HGB 12.2* 9.5*  PLT  --  87*  CREATININE  --  9.02*   Estimated Creatinine Clearance: 9.7 mL/min (by C-G formula based on Cr of 9.02). No results for input(s): VANCOTROUGH, VANCOPEAK, VANCORANDOM, GENTTROUGH, GENTPEAK, GENTRANDOM, TOBRATROUGH, TOBRAPEAK, TOBRARND, AMIKACINPEAK, AMIKACINTROU, AMIKACIN in the last 72 hours.   Microbiology: Recent Results (from the past 720 hour(s))  MRSA PCR Screening     Status: None   Collection Time: 11/10/13  6:33 PM  Result Value Ref Range Status   MRSA by PCR NEGATIVE NEGATIVE Final    Comment:        The GeneXpert MRSA Assay (FDA approved for NASAL specimens only), is one component of a comprehensive MRSA colonization surveillance program. It is not intended to diagnose MRSA infection nor to guide or monitor treatment for MRSA infections.     Medical History: Past Medical History  Diagnosis Date  . Hypertension   . Anemia   . Hemiparesis due to old stroke     left  . LV dysfunction     EF 25-30% by echo 07/2011  . MR (mitral regurgitation)     moderate to severe, echo 07/2011  . Shortness of breath   . CHF (congestive heart failure)   . COPD (chronic obstructive pulmonary disease)   . Tobacco abuse   . Stroke     TIA's  . Asthma   . Diabetes mellitus without  complication     not on medications  . Anxiety   . ESRD     on HD, M- W- F   . Memory loss due to medical condition     due to stroke    Assessment: 53 yo man with ESRD on HD to receive zinacef post op prophylaxis.  1.5 gm IV q12 hours for 2 doses was ordered.  Goal of Therapy:  Renal dose antibiotics  Plan:  Will dc last dose due of zinacef as one dose will suffice with ESRD.  Tamlyn Sides, [place] 11/11/2013,11:14 AM

## 2013-11-12 LAB — GLUCOSE, CAPILLARY: Glucose-Capillary: 57 mg/dL — ABNORMAL LOW (ref 70–99)

## 2013-11-13 DIAGNOSIS — E119 Type 2 diabetes mellitus without complications: Secondary | ICD-10-CM | POA: Diagnosis not present

## 2013-11-13 DIAGNOSIS — D631 Anemia in chronic kidney disease: Secondary | ICD-10-CM | POA: Diagnosis not present

## 2013-11-13 DIAGNOSIS — N186 End stage renal disease: Secondary | ICD-10-CM | POA: Diagnosis not present

## 2013-11-13 DIAGNOSIS — N2581 Secondary hyperparathyroidism of renal origin: Secondary | ICD-10-CM | POA: Diagnosis not present

## 2013-11-13 DIAGNOSIS — D509 Iron deficiency anemia, unspecified: Secondary | ICD-10-CM | POA: Diagnosis not present

## 2013-11-13 NOTE — Progress Notes (Signed)
Utilization Review Completed.  

## 2013-11-14 DIAGNOSIS — D631 Anemia in chronic kidney disease: Secondary | ICD-10-CM | POA: Diagnosis not present

## 2013-11-14 DIAGNOSIS — E119 Type 2 diabetes mellitus without complications: Secondary | ICD-10-CM | POA: Diagnosis not present

## 2013-11-14 DIAGNOSIS — N2581 Secondary hyperparathyroidism of renal origin: Secondary | ICD-10-CM | POA: Diagnosis not present

## 2013-11-14 DIAGNOSIS — N186 End stage renal disease: Secondary | ICD-10-CM | POA: Diagnosis not present

## 2013-11-14 DIAGNOSIS — D509 Iron deficiency anemia, unspecified: Secondary | ICD-10-CM | POA: Diagnosis not present

## 2013-11-15 ENCOUNTER — Emergency Department (HOSPITAL_BASED_OUTPATIENT_CLINIC_OR_DEPARTMENT_OTHER): Payer: Medicare Other

## 2013-11-15 ENCOUNTER — Encounter (HOSPITAL_BASED_OUTPATIENT_CLINIC_OR_DEPARTMENT_OTHER): Payer: Self-pay | Admitting: Emergency Medicine

## 2013-11-15 ENCOUNTER — Emergency Department (HOSPITAL_BASED_OUTPATIENT_CLINIC_OR_DEPARTMENT_OTHER)
Admission: EM | Admit: 2013-11-15 | Discharge: 2013-11-15 | Disposition: A | Discharge status: Home / Self Care | Payer: Medicare Other | Attending: Emergency Medicine | Admitting: Emergency Medicine

## 2013-11-15 DIAGNOSIS — Z862 Personal history of diseases of the blood and blood-forming organs and certain disorders involving the immune mechanism: Secondary | ICD-10-CM | POA: Diagnosis not present

## 2013-11-15 DIAGNOSIS — Z79899 Other long term (current) drug therapy: Secondary | ICD-10-CM | POA: Diagnosis not present

## 2013-11-15 DIAGNOSIS — I517 Cardiomegaly: Secondary | ICD-10-CM | POA: Diagnosis not present

## 2013-11-15 DIAGNOSIS — E119 Type 2 diabetes mellitus without complications: Secondary | ICD-10-CM | POA: Insufficient documentation

## 2013-11-15 DIAGNOSIS — Z992 Dependence on renal dialysis: Secondary | ICD-10-CM | POA: Diagnosis not present

## 2013-11-15 DIAGNOSIS — F419 Anxiety disorder, unspecified: Secondary | ICD-10-CM | POA: Insufficient documentation

## 2013-11-15 DIAGNOSIS — J441 Chronic obstructive pulmonary disease with (acute) exacerbation: Secondary | ICD-10-CM | POA: Insufficient documentation

## 2013-11-15 DIAGNOSIS — I120 Hypertensive chronic kidney disease with stage 5 chronic kidney disease or end stage renal disease: Secondary | ICD-10-CM | POA: Diagnosis not present

## 2013-11-15 DIAGNOSIS — I509 Heart failure, unspecified: Secondary | ICD-10-CM | POA: Insufficient documentation

## 2013-11-15 DIAGNOSIS — R05 Cough: Secondary | ICD-10-CM | POA: Diagnosis not present

## 2013-11-15 DIAGNOSIS — Z8673 Personal history of transient ischemic attack (TIA), and cerebral infarction without residual deficits: Secondary | ICD-10-CM | POA: Diagnosis not present

## 2013-11-15 DIAGNOSIS — R0602 Shortness of breath: Secondary | ICD-10-CM

## 2013-11-15 DIAGNOSIS — Z87891 Personal history of nicotine dependence: Secondary | ICD-10-CM | POA: Insufficient documentation

## 2013-11-15 DIAGNOSIS — Z7951 Long term (current) use of inhaled steroids: Secondary | ICD-10-CM | POA: Diagnosis not present

## 2013-11-15 DIAGNOSIS — I10 Essential (primary) hypertension: Secondary | ICD-10-CM | POA: Diagnosis not present

## 2013-11-15 DIAGNOSIS — N186 End stage renal disease: Secondary | ICD-10-CM | POA: Diagnosis not present

## 2013-11-15 DIAGNOSIS — J984 Other disorders of lung: Secondary | ICD-10-CM | POA: Diagnosis not present

## 2013-11-15 MED ORDER — ALBUTEROL SULFATE (2.5 MG/3ML) 0.083% IN NEBU
5.0000 mg | INHALATION_SOLUTION | Freq: Once | RESPIRATORY_TRACT | Status: AC
  Administered 2013-11-15: 5 mg via RESPIRATORY_TRACT
  Filled 2013-11-15: qty 6

## 2013-11-15 MED ORDER — ALBUTEROL SULFATE HFA 108 (90 BASE) MCG/ACT IN AERS
2.0000 | INHALATION_SPRAY | RESPIRATORY_TRACT | Status: DC | PRN
  Administered 2013-11-15: 2 via RESPIRATORY_TRACT
  Filled 2013-11-15: qty 6.7

## 2013-11-15 MED ORDER — ALBUTEROL SULFATE (2.5 MG/3ML) 0.083% IN NEBU
2.5000 mg | INHALATION_SOLUTION | Freq: Once | RESPIRATORY_TRACT | Status: AC
  Administered 2013-11-15: 2.5 mg via RESPIRATORY_TRACT
  Filled 2013-11-15: qty 3

## 2013-11-15 MED ORDER — IPRATROPIUM-ALBUTEROL 0.5-2.5 (3) MG/3ML IN SOLN
3.0000 mL | RESPIRATORY_TRACT | Status: DC
  Administered 2013-11-15: 3 mL via RESPIRATORY_TRACT
  Filled 2013-11-15: qty 3

## 2013-11-15 NOTE — ED Provider Notes (Signed)
CSN: [id number]     Arrival date & time 11/15/13  0251 History   First MD Initiated Contact with Patient 11/15/13 0327     Chief Complaint  Patient presents with  . Cough     (Consider location/radiation/quality/duration/timing/severity/associated sxs/prior Treatment) HPI  This is a 52 year old male with a history of COPD and CHF. He is here with cough and shortness of breath that began yesterday morning and has gradually worsened.he states his chest has become sore from coughing and he now has pain in his anterior chest on deep breathing and coughing. He has been coughing up phlegm. He is not aware of having a fever. Has not been vomiting. His symptoms are moderate to severe. He states he has an inhaler which he uses twice daily, likely Advair. It is unclear if he has an albuterol inhaler.he has mild left hemiparesis due to an old stroke. He has chronic edema of the lower legs. He recently had surgical revision of his left upper arm dialysis fistula.  Past Medical History  Diagnosis Date  . Hypertension   . Anemia   . Hemiparesis due to old stroke     left  . LV dysfunction     EF 25-30% by echo 07/2011  . MR (mitral regurgitation)     moderate to severe, echo 07/2011  . Shortness of breath   . CHF (congestive heart failure)   . COPD (chronic obstructive pulmonary disease)   . Tobacco abuse   . Stroke     TIA's  . Asthma   . Diabetes mellitus without complication     not on medications  . Anxiety   . ESRD     on HD, M- W- F   . Memory loss due to medical condition     due to stroke   Past Surgical History  Procedure Laterality Date  . Kidney transplant      2011 rejected kidney 2012 back on dialysis  . Av fistula placement    . Cardiac catheterization      [place] without cardioversion  08/17/2011    Procedure: TRANSESOPHAGEAL ECHOCARDIOGRAM (TEE);  Surgeon: Larey Dresser, MD;  Location: [place];  Service: Cardiovascular;  Laterality: N/A;  . Revison  of arteriovenous fistula Left 08/26/2013    Procedure: EXCISION OF ERODED SKIN AND EXPLORATION OF MAIN LEFT UPPER ARM AV FISTULA;  Surgeon: Rosetta Posner, MD;  Location: [place];  Service: Vascular;  Laterality: Left;  . Video assisted thoracoscopy (vats)/decortication  08/10/11  . Fistula superficialization Left 11/10/2013    Procedure: FISTULA PLICATION;  Surgeon: Serafina Mitchell, MD;  Location: [facility];  Service: Vascular;  Laterality: Left;   Family History  Problem Relation Age of Onset  . Hypertension Mother   . Varicose Veins Mother   . Diabetes Paternal Grandmother   . Cancer Paternal Grandfather   . CAD Paternal Uncle    History  Substance Use Topics  . Smoking status: Former Smoker -- 0.50 packs/day for 32 years    Types: Cigarettes    Quit date: 10/13/2012  . Smokeless tobacco: Never Used  . Alcohol Use: No    Review of Systems  All other systems reviewed and are negative.   Allergies  Codeine  Home Medications   Prior to Admission medications   Medication Sig Start Date End Date Taking? Authorizing Provider  albuterol (PROVENTIL HFA;VENTOLIN HFA) 108 (90 BASE) MCG/ACT inhaler Inhale 1-2 puffs into the lungs every 6 (six) hours  as needed for wheezing. 07/01/12   April K Palumbo-Rasch, MD  Fluticasone-Salmeterol (ADVAIR) 250-50 MCG/DOSE AEPB Inhale 2 puffs into the lungs daily at 12 noon.    Historical Provider, MD  labetalol (NORMODYNE) 300 MG tablet Take 300 mg by mouth 2 (two) times daily.     Historical Provider, MD  traMADol (ULTRAM) 50 MG tablet Take 1 tablet (50 mg total) by mouth every 6 (six) hours as needed. 11/10/13   Joelene Millin A Trinh, PA-C   BP 156/93 mmHg  Pulse 98  Temp(Src) 98.2 F (36.8 C) (Oral)  Resp 18  SpO2 94%   Physical Exam  General: Well-developed, well-nourished male in no acute distress; appearance consistent with age of record HENT: normocephalic; atraumatic Eyes: pupils equal, round and reactive to light; extraocular muscles intact; arcus  senilis bilaterally; left medial pterygium Neck: supple Heart: regular rate and rhythm Lungs: decreased air movement bilaterally; shallow breaths Chest: Nontender Abdomen: soft; nondistended; nontender; bowel sounds present Extremities: No deformity; full range of motion; 2+ edema of the lower legs; dialysis fistula left upper arm with pulse and thrill, postoperative bandage in place Neurologic: Awake, alert and oriented; left hemiparesis; no facial droop Skin: Warm and dry Psychiatric: Normal mood and affect    ED Course  Procedures (including critical care time)    MDM    EKG Interpretation  Date/Time:  Sunday November 15 2013 03:19:21 EST Ventricular Rate:  97 PR Interval:  194 QRS Duration: 116 QT Interval:  390 QTC Calculation: 495 R Axis:   16 Text Interpretation:  Normal sinus rhythm Left ventricular hypertrophy with QRS widening Nonspecific ST and T wave abnormality Prolonged QT Abnormal ECG No significant change was found Confirmed by Laurence Crofford  MD, Jenny Reichmann ([id number]) on 11/15/2013 3:30:23 AM      Nursing notes and vitals signs, including pulse oximetry, reviewed.  Summary of this visit's results, reviewed by myself:  Labs:  No results found for this or any previous visit (from the past 24 hour(s)).  Imaging Studies: Dg Chest 2 View  11/15/2013   CLINICAL DATA:  Cough for 1 day. Increasing in severity. Shortness of breath.  EXAM: CHEST  2 VIEW  COMPARISON:  10/06/2013  FINDINGS: Cardiac enlargement. No significant vascular congestion or edema. Blunting of the right costophrenic angle with scarring in the right lung base similar previous study. No superimposed infiltration is suggested. No pneumothorax. Tortuous aorta.  IMPRESSION: Cardiac enlargement. Right pleural thickening and basilar scarring similar to prior study. No evidence of active infiltration.   Electronically Signed   By: Lucienne Capers M.D.   On: 11/15/2013 04:20   4:26 AM Air movement improved in all  fields after albuterol and Atrovent neb treatment. Rales heard in right base, consistent with chronic changes seen on chest x-ray.  4:44 AM Continued improvement after second neb treatment. Patient states he is ready to go home. We will give him a new inhaler as he is out of albuterol at home.  Wynetta Fines, MD 11/15/13 [phone]

## 2013-11-15 NOTE — ED Notes (Signed)
Pt reports cough onset x1 day that has increased in severity, cough has scant clear product

## 2013-11-17 DIAGNOSIS — D631 Anemia in chronic kidney disease: Secondary | ICD-10-CM | POA: Diagnosis not present

## 2013-11-17 DIAGNOSIS — E119 Type 2 diabetes mellitus without complications: Secondary | ICD-10-CM | POA: Diagnosis not present

## 2013-11-17 DIAGNOSIS — N186 End stage renal disease: Secondary | ICD-10-CM | POA: Diagnosis not present

## 2013-11-17 DIAGNOSIS — N2581 Secondary hyperparathyroidism of renal origin: Secondary | ICD-10-CM | POA: Diagnosis not present

## 2013-11-17 DIAGNOSIS — D509 Iron deficiency anemia, unspecified: Secondary | ICD-10-CM | POA: Diagnosis not present

## 2013-11-19 DIAGNOSIS — N186 End stage renal disease: Secondary | ICD-10-CM | POA: Diagnosis not present

## 2013-11-19 DIAGNOSIS — E119 Type 2 diabetes mellitus without complications: Secondary | ICD-10-CM | POA: Diagnosis not present

## 2013-11-19 DIAGNOSIS — D509 Iron deficiency anemia, unspecified: Secondary | ICD-10-CM | POA: Diagnosis not present

## 2013-11-19 DIAGNOSIS — D631 Anemia in chronic kidney disease: Secondary | ICD-10-CM | POA: Diagnosis not present

## 2013-11-19 DIAGNOSIS — N2581 Secondary hyperparathyroidism of renal origin: Secondary | ICD-10-CM | POA: Diagnosis not present

## 2013-11-21 DIAGNOSIS — D631 Anemia in chronic kidney disease: Secondary | ICD-10-CM | POA: Diagnosis not present

## 2013-11-21 DIAGNOSIS — D509 Iron deficiency anemia, unspecified: Secondary | ICD-10-CM | POA: Diagnosis not present

## 2013-11-21 DIAGNOSIS — E119 Type 2 diabetes mellitus without complications: Secondary | ICD-10-CM | POA: Diagnosis not present

## 2013-11-21 DIAGNOSIS — N186 End stage renal disease: Secondary | ICD-10-CM | POA: Diagnosis not present

## 2013-11-21 DIAGNOSIS — N2581 Secondary hyperparathyroidism of renal origin: Secondary | ICD-10-CM | POA: Diagnosis not present

## 2013-11-24 DIAGNOSIS — N2581 Secondary hyperparathyroidism of renal origin: Secondary | ICD-10-CM | POA: Diagnosis not present

## 2013-11-24 DIAGNOSIS — D509 Iron deficiency anemia, unspecified: Secondary | ICD-10-CM | POA: Diagnosis not present

## 2013-11-24 DIAGNOSIS — E119 Type 2 diabetes mellitus without complications: Secondary | ICD-10-CM | POA: Diagnosis not present

## 2013-11-24 DIAGNOSIS — N186 End stage renal disease: Secondary | ICD-10-CM | POA: Diagnosis not present

## 2013-11-24 DIAGNOSIS — D631 Anemia in chronic kidney disease: Secondary | ICD-10-CM | POA: Diagnosis not present

## 2013-11-26 DIAGNOSIS — E119 Type 2 diabetes mellitus without complications: Secondary | ICD-10-CM | POA: Diagnosis not present

## 2013-11-26 DIAGNOSIS — N186 End stage renal disease: Secondary | ICD-10-CM | POA: Diagnosis not present

## 2013-11-26 DIAGNOSIS — D631 Anemia in chronic kidney disease: Secondary | ICD-10-CM | POA: Diagnosis not present

## 2013-11-26 DIAGNOSIS — N2581 Secondary hyperparathyroidism of renal origin: Secondary | ICD-10-CM | POA: Diagnosis not present

## 2013-11-26 DIAGNOSIS — D509 Iron deficiency anemia, unspecified: Secondary | ICD-10-CM | POA: Diagnosis not present

## 2013-11-27 ENCOUNTER — Encounter: Payer: Self-pay | Admitting: Surgery

## 2013-11-28 DIAGNOSIS — D509 Iron deficiency anemia, unspecified: Secondary | ICD-10-CM | POA: Diagnosis not present

## 2013-11-28 DIAGNOSIS — N2581 Secondary hyperparathyroidism of renal origin: Secondary | ICD-10-CM | POA: Diagnosis not present

## 2013-11-28 DIAGNOSIS — N186 End stage renal disease: Secondary | ICD-10-CM | POA: Diagnosis not present

## 2013-11-28 DIAGNOSIS — E119 Type 2 diabetes mellitus without complications: Secondary | ICD-10-CM | POA: Diagnosis not present

## 2013-11-28 DIAGNOSIS — D631 Anemia in chronic kidney disease: Secondary | ICD-10-CM | POA: Diagnosis not present

## 2013-11-30 ENCOUNTER — Encounter: Payer: Medicare Other | Admitting: Surgery

## 2013-11-30 DIAGNOSIS — N2581 Secondary hyperparathyroidism of renal origin: Secondary | ICD-10-CM | POA: Diagnosis not present

## 2013-11-30 DIAGNOSIS — E119 Type 2 diabetes mellitus without complications: Secondary | ICD-10-CM | POA: Diagnosis not present

## 2013-11-30 DIAGNOSIS — D631 Anemia in chronic kidney disease: Secondary | ICD-10-CM | POA: Diagnosis not present

## 2013-11-30 DIAGNOSIS — N186 End stage renal disease: Secondary | ICD-10-CM | POA: Diagnosis not present

## 2013-11-30 DIAGNOSIS — D509 Iron deficiency anemia, unspecified: Secondary | ICD-10-CM | POA: Diagnosis not present

## 2013-12-01 ENCOUNTER — Inpatient Hospital Stay: Payer: Medicare Other | Admitting: Pulmonary Disease

## 2013-12-02 DIAGNOSIS — D509 Iron deficiency anemia, unspecified: Secondary | ICD-10-CM | POA: Diagnosis not present

## 2013-12-02 DIAGNOSIS — D631 Anemia in chronic kidney disease: Secondary | ICD-10-CM | POA: Diagnosis not present

## 2013-12-02 DIAGNOSIS — E119 Type 2 diabetes mellitus without complications: Secondary | ICD-10-CM | POA: Diagnosis not present

## 2013-12-02 DIAGNOSIS — N186 End stage renal disease: Secondary | ICD-10-CM | POA: Diagnosis not present

## 2013-12-02 DIAGNOSIS — N2581 Secondary hyperparathyroidism of renal origin: Secondary | ICD-10-CM | POA: Diagnosis not present

## 2013-12-05 DIAGNOSIS — E119 Type 2 diabetes mellitus without complications: Secondary | ICD-10-CM | POA: Diagnosis not present

## 2013-12-05 DIAGNOSIS — D631 Anemia in chronic kidney disease: Secondary | ICD-10-CM | POA: Diagnosis not present

## 2013-12-05 DIAGNOSIS — D509 Iron deficiency anemia, unspecified: Secondary | ICD-10-CM | POA: Diagnosis not present

## 2013-12-05 DIAGNOSIS — N2581 Secondary hyperparathyroidism of renal origin: Secondary | ICD-10-CM | POA: Diagnosis not present

## 2013-12-05 DIAGNOSIS — N186 End stage renal disease: Secondary | ICD-10-CM | POA: Diagnosis not present

## 2013-12-07 DIAGNOSIS — N186 End stage renal disease: Secondary | ICD-10-CM | POA: Diagnosis not present

## 2013-12-07 DIAGNOSIS — Z992 Dependence on renal dialysis: Secondary | ICD-10-CM | POA: Diagnosis not present

## 2013-12-08 DIAGNOSIS — N2581 Secondary hyperparathyroidism of renal origin: Secondary | ICD-10-CM | POA: Diagnosis not present

## 2013-12-08 DIAGNOSIS — E119 Type 2 diabetes mellitus without complications: Secondary | ICD-10-CM | POA: Diagnosis not present

## 2013-12-08 DIAGNOSIS — D631 Anemia in chronic kidney disease: Secondary | ICD-10-CM | POA: Diagnosis not present

## 2013-12-08 DIAGNOSIS — N186 End stage renal disease: Secondary | ICD-10-CM | POA: Diagnosis not present

## 2013-12-17 ENCOUNTER — Encounter (HOSPITAL_COMMUNITY): Payer: Self-pay | Admitting: Surgery

## 2013-12-29 ENCOUNTER — Encounter: Payer: Self-pay | Admitting: Family

## 2013-12-29 ENCOUNTER — Ambulatory Visit: Payer: Medicare Other | Admitting: Family

## 2013-12-30 ENCOUNTER — Encounter: Payer: Self-pay | Admitting: Family

## 2013-12-30 ENCOUNTER — Ambulatory Visit (INDEPENDENT_AMBULATORY_CARE_PROVIDER_SITE_OTHER): Payer: Self-pay | Admitting: Family

## 2013-12-30 VITALS — BP 137/82 | HR 95 | Temp 98.3°F | Resp 16 | Ht 67.5 in | Wt 181.0 lb

## 2013-12-30 DIAGNOSIS — T82898D Other specified complication of vascular prosthetic devices, implants and grafts, subsequent encounter: Secondary | ICD-10-CM

## 2013-12-30 DIAGNOSIS — Z4802 Encounter for removal of sutures: Secondary | ICD-10-CM

## 2013-12-30 DIAGNOSIS — T82510D Breakdown (mechanical) of surgically created arteriovenous fistula, subsequent encounter: Secondary | ICD-10-CM

## 2013-12-30 DIAGNOSIS — N186 End stage renal disease: Secondary | ICD-10-CM

## 2013-12-30 NOTE — Patient Instructions (Addendum)
Vascular Access for Hemodialysis A vascular access is a connection between two blood vessels that allows blood to be easily removed from the body and returned to the body during hemodialysis. Hemodialysis is a procedure in which a machine outside of the body filters the blood. There are three types of vascular accesses:   Arteriovenous fistula. This is a connection between an artery and a vein (usually in the arm) that is made by sewing them together. Blood in the artery flows directly into the vein, causing it to get larger over time. This makes it easier for the vein to be used for hemodialysis. An arteriovenous fistula takes 1-6 months to develop after surgery.   Arteriovenous graft. This is a connection between an artery and a vein in the arm that is made with a tube. An arteriovenous graft can be used within 2-3 weeks of surgery.   Venous catheter. This is a thin, flexible tube that is placed in a large vein (usually in the neck, chest, or groin). A venous catheter for hemodialysis contains two tubes that come out of the skin. A venous catheter can be used right away. It is usually used as a temporary access if you need hemodialysis before a fistula or graft has developed. It may also be used as a permanent access if a fistula or graft cannot be created. WHICH TYPE OF ACCESS IS BEST FOR ME? The type of access that is best for you depends on the size and strength of your veins.  A fistula is usually the preferred type of access. It can last several years and is less likely than the other types of accesses to become infected or to cause blood clots within a blood vessel (thrombosis). However, a fistula is not an option for everyone. If your veins are not the right size, a graft may be used instead. Grafts require you to have strong veins. If your veins are not strong enough for a graft, a catheter may be used. Catheters are more likely than fistulas and grafts to become infected or to have thrombosis.   Sometimes, only one type of access is an option. Your health care provider will help you determine which type of access is best for you.  HOW IS A VASCULAR ACCESS USED? The way the access is used depends on the type of access:   If the access is a fistula or graft, two needles are inserted through the skin into the access before each hemodialysis session. Blood leaves the body through one of the needles and travels through a tube to the hemodialysis machine (dialyzer). It then flows through another tube and returns to the body through the second needle.   If the access is a catheter, one tube is connected directly to the tube that leads to the dialyzer and the other is connected to a tube that leads away from the dialyzer. Blood leaves the body through one tube and returns to the body through the other.  WHAT KIND OF PROBLEMS CAN OCCUR WITH VASCULAR ACCESSES?  Blood clots within a blood vessel (thrombosis). Thrombosis can lead to a narrowing of a blood vessel or tube (stenosis). If thrombosis occurs frequently, another access site may be created as a backup.   Infection.  These problems are most likely to occur with a venous catheter and least likely to occur with an arteriovenous fistula.  HOW DO I CARE FOR MY VASCULAR ACCESS? Wear a medical alert bracelet. This tells health care providers that you are   a dialysis patient in the case of an emergency and allows them to care for your veins appropriately. If you have a graft or fistula:   A "bruit" is a noise that is heard with a stethoscope and a "thrill" is a vibration felt over the graft or fistula. The presence of the bruit and thrill indicates that the access is working. You will be taught to feel for the thrill each day. If this is not felt, the access may be clotted. Call your health care provider.   You may use the arm where your vascular access is located freely after the site heals. Keep the following in mind:   Avoid pressure on  the arm.   Avoid lifting heavy objects with the arm.   Avoid sleeping on the arm.   Avoid wearing tight-sleeved shirts or jewelry around the graft or fistula.   Do not allow blood pressure monitoring or needle punctures on the side where the graft or fistula is located.   With permission from your health care provider, you may do exercises to help with blood flow through a fistula. These exercises involve squeezing a rubber ball or other soft objects as instructed. SEEK MEDICAL CARE IF:   Chills develop.   You have an oral temperature above 102 F (38.9 C).  Swelling around the graft or fistula gets worse.   New pain develops.   Pus or other fluid (drainage) is seen at the vascular access site.   Skin redness or red streaking is seen on the skin around, above, or below the vascular access. SEEK IMMEDIATE MEDICAL CARE IF:   Pain, numbness, or an unusual pale skin color develops in the hand on the side of your fistula.   Dizziness or weakness develops that you have not had before.   The vascular access has bleeding that cannot be easily controlled. Document Released: 03/17/2002 Document Revised: 05/11/2013 Document Reviewed: 05/13/2012 [facility] Patient Information 2015 [place], [place]. This information is not intended to replace advice given to you by your health care provider. Make sure you discuss any questions you have with your health care provider.   AV Fistula, Care After Refer to this sheet in the next few weeks. These instructions provide you with information on caring for yourself after your procedure. Your caregiver may also give you more specific instructions. Your treatment has been planned according to current medical practices, but problems sometimes occur. Call your caregiver if you have any problems or questions after your procedure. HOME CARE INSTRUCTIONS   Do not drive a car or take public transportation alone.  Do not drink alcohol.  Only take  medicine that has been prescribed by your caregiver.  Do not sign important papers or make important decisions.  Have a responsible person with you.  Ask your caregiver to show you how to check your access at home for a vibration (called a "thrill") or for a sound (called a "bruit" pronounced brew-ee).  Your vein will need time to enlarge and mature so needles can be inserted for dialysis. Follow your caregiver's instructions about what you need to do to make this happen.  Keep dressings clean and dry.  Keep the arm elevated above your heart. Use a pillow.  Rest.  Use the arm as usual for all activities.  Have the stitches or tape closures removed in 10 to 14 days, or as directed by your caregiver.  Do not sleep or lie on the area of the fistula or that arm. This  may decrease or stop the blood flow through your fistula.  Do not allow blood pressures to be taken on this arm.  Do not allow blood drawing to be done from the graft.  Do not wear tight clothing around the access site or on the arm.  Avoid lifting heavy objects with the arm that has the fistula.  Do not use creams or lotions over the access site. SEEK MEDICAL CARE IF:   You have a fever.  You have swelling around the fistula that gets worse, or you have new pain.  You have unusual bleeding at the fistula site or from any other area.  You have pus or other drainage at the fistula site.  You have skin redness or red streaking on the skin around, above, or below the fistula site.  Your access site feels warm.  You have any flu-like symptoms. SEEK IMMEDIATE MEDICAL CARE IF:   You have pain, numbness, or an unusual pale skin on the hand or on the side of your fistula.  You have dizziness or weakness that you have not had before.  You have shortness of breath.  You have chest pain.  Your fistula disconnects or breaks, and there is bleeding that cannot be easily controlled. Call for local emergency medical  help. Do not try to drive yourself to the hospital. MAKE SURE YOU  Understand these instructions.  Will watch your condition.  Will get help right away if you are not doing well or get worse. Document Released: 12/25/2004 Document Revised: 05/11/2013 Document Reviewed: 06/14/2010 [facility] Patient Information 2015 [place], [place]. This information is not intended to replace advice given to you by your health care provider. Make sure you discuss any questions you have with your health care provider.

## 2013-12-30 NOTE — Progress Notes (Signed)
    Postoperative Access Visit   History of Present Illness  Nathaniel White is a 53 y.o. year old male patient of Dr. Trula Slade who is s/p revison of left UE arteriovenous fistula on 08/26/2013. He is also s/p plication and aneurysm resection of left upper arm AV fistula on 11/10/13. He returns today for suture removal from 11/10/13 and non healing scab on remaining aneurysmal area of left upper arm AVF. Pt was scheduled to return before today for suture removal but was unable to do so. He dialyzes T-TH-S; the aneurysm proximal to the plication site is currently used for HD; puncture sites are not bleeding and have healing puncture sites.  The patient's wounds are healed.  The patient notes no steal symptoms.  The patient is is able to complete their activities of daily living.   Pt has a chronic productive cough, but lungs sound clear to auscultation with adequate air movement. Pt states his last cigarette was a year ago. Pt advised to see his PCP re his chronic cough.   For VQI Use Only  PRE-ADM LIVING: Home  AMB STATUS: Ambulatory  Physical Examination Filed Vitals:   12/30/13 1429  BP: 137/82  Pulse: 95  Temp: 98.3 F (36.8 C)  TempSrc: Oral  Resp: 16  Height: 5' 7.5" (1.715 m)  Weight: 181 lb (82.101 kg)  SpO2: 98%   Body mass index is 27.91 kg/(m^2).  LUE: Incision is well healed, skin feels warm, hand grip is 5/5, sensation in digits is intact, palpable thrill, bruit can be auscultated.   Medical Decision Making  Nathaniel White is a 53 y.o. year old male who presents s/p plication and aneurysm resection of left upper arm AV fistula on 11/10/13. Sutures were removed.  The patient's access at aneurysm repair site should be ready for use with his next HD.  Return in 2 weeks to discuss repair of the remaining aneurysm proximal to the left upper arm AVF with Dr. Trula Slade.  If left upper arm AVF aneurysm ruptures, pt advised to apply pressure with his right hand and a  towel or cloth, and call 911.   Aleeza [place], Sharmon Leyden, RN, MSN, FNP-C [facility] Office: [phone]  12/30/2013, 2:17 PM  Clinic MD: Early on call

## 2014-01-03 DIAGNOSIS — R197 Diarrhea, unspecified: Secondary | ICD-10-CM | POA: Insufficient documentation

## 2014-01-05 ENCOUNTER — Encounter: Payer: Self-pay | Admitting: Surgery

## 2014-01-07 DIAGNOSIS — N186 End stage renal disease: Secondary | ICD-10-CM | POA: Diagnosis not present

## 2014-01-07 DIAGNOSIS — Z992 Dependence on renal dialysis: Secondary | ICD-10-CM | POA: Diagnosis not present

## 2014-01-10 DIAGNOSIS — N186 End stage renal disease: Secondary | ICD-10-CM | POA: Diagnosis not present

## 2014-01-10 DIAGNOSIS — D631 Anemia in chronic kidney disease: Secondary | ICD-10-CM | POA: Diagnosis not present

## 2014-01-10 DIAGNOSIS — E119 Type 2 diabetes mellitus without complications: Secondary | ICD-10-CM | POA: Diagnosis not present

## 2014-01-10 DIAGNOSIS — N2581 Secondary hyperparathyroidism of renal origin: Secondary | ICD-10-CM | POA: Diagnosis not present

## 2014-01-11 ENCOUNTER — Encounter: Payer: Self-pay | Admitting: Surgery

## 2014-01-11 ENCOUNTER — Ambulatory Visit (INDEPENDENT_AMBULATORY_CARE_PROVIDER_SITE_OTHER): Payer: Self-pay | Admitting: Surgery

## 2014-01-11 VITALS — BP 150/90 | HR 98 | Temp 98.3°F | Resp 16 | Ht 69.0 in | Wt 179.0 lb

## 2014-01-11 DIAGNOSIS — T82510D Breakdown (mechanical) of surgically created arteriovenous fistula, subsequent encounter: Secondary | ICD-10-CM

## 2014-01-11 DIAGNOSIS — T82898D Other specified complication of vascular prosthetic devices, implants and grafts, subsequent encounter: Secondary | ICD-10-CM

## 2014-01-11 DIAGNOSIS — N185 Chronic kidney disease, stage 5: Secondary | ICD-10-CM

## 2014-01-11 NOTE — Progress Notes (Signed)
Patient name: Nathaniel White MRN: [id number] DOB: 09/18/1960 Sex: male     Chief Complaint  Patient presents with  . Follow-up    Discuss Left UE AVF revision    Has HD on T, Th, and Sat at [facility]: The patient is back for follow-up.  On [id number] he underwent plication and aneurysm resection of his left upper arm fistula.  He has a second aneurysm that was not resected so as to avoid placing a catheter.  He has undergone angioplasty of the cephalic vein stenosis in the past, October 2014.  The patient does have a scab over top of his aneurysm in the upper arm.  Dialysis is cannulating his fistula at the site of repair.  Past Medical History  Diagnosis Date  . Hypertension   . Anemia   . Hemiparesis due to old stroke     left  . LV dysfunction     EF 25-30% by echo 07/2011  . MR (mitral regurgitation)     moderate to severe, echo 07/2011  . Shortness of breath   . CHF (congestive heart failure)   . COPD (chronic obstructive pulmonary disease)   . Tobacco abuse   . Stroke     TIA's  . Asthma   . Diabetes mellitus without complication     not on medications  . Anxiety   . ESRD     on HD, M- W- F   . Memory loss due to medical condition     due to stroke    Past Surgical History  Procedure Laterality Date  . Kidney transplant      2011 rejected kidney 2012 back on dialysis  . Av fistula placement    . Cardiac catheterization      [place] without cardioversion  08/17/2011    Procedure: TRANSESOPHAGEAL ECHOCARDIOGRAM (TEE);  Surgeon: Larey Dresser, MD;  Location: [place];  Service: Cardiovascular;  Laterality: N/A;  . Revison of arteriovenous fistula Left 08/26/2013    Procedure: EXCISION OF ERODED SKIN AND EXPLORATION OF MAIN LEFT UPPER ARM AV FISTULA;  Surgeon: Rosetta Posner, MD;  Location: [place];  Service: Vascular;  Laterality: Left;  . Video assisted thoracoscopy (vats)/decortication  08/10/11  . Fistula  superficialization Left 11/10/2013    Procedure: FISTULA PLICATION;  Surgeon: Serafina Mitchell, MD;  Location: [place];  Service: Vascular;  Laterality: Left;  . Shuntogram N/A 11/05/2012    Procedure: Fistulogram;  Surgeon: Serafina Mitchell, MD;  Location: [facility] C[facility] LAB;  Service: Cardiovascular;  Laterality: N/A;  . Left and right heart catheterization with coronary angiogram N/A 10/09/2013    Procedure: LEFT AND RIGHT HEART CATHETERIZATION WITH CORONARY ANGIOGRAM;  Surgeon: Burnell Blanks, MD;  Location: [facility] CATH LAB;  Service: Cardiovascular;  Laterality: N/A;    History   Social History  . Marital Status: Single    Spouse Name: N/A    Number of Children: 1  . Years of Education: N/A   Occupational History  . Retired-[employer]    Social History Main Topics  . Smoking status: Former Smoker -- 0.50 packs/day for 32 years    Types: Cigarettes    Quit date: 10/13/2012  . Smokeless tobacco: Never Used  . Alcohol Use: No  . Drug Use: No  . Sexual Activity: Not on file   Other Topics Concern  . Not on file   Social  History Narrative    Family History  Problem Relation Age of Onset  . Hypertension Mother   . Varicose Veins Mother   . Diabetes Paternal Grandmother   . Cancer Paternal Grandfather   . CAD Paternal Uncle     Allergies as of 01/11/2014 - Review Complete 01/11/2014  Allergen Reaction Noted  . Codeine Shortness Of Breath 09/29/2008    Current Outpatient Prescriptions on File Prior to Visit  Medication Sig Dispense Refill  . albuterol (PROVENTIL HFA;VENTOLIN HFA) 108 (90 BASE) MCG/ACT inhaler Inhale 1-2 puffs into the lungs every 6 (six) hours as needed for wheezing. 1 Inhaler 0  . Fluticasone-Salmeterol (ADVAIR) 250-50 MCG/DOSE AEPB Inhale 2 puffs into the lungs daily at 12 noon.    . labetalol (NORMODYNE) 300 MG tablet Take 300 mg by mouth 2 (two) times daily.     . tadalafil (CIALIS) 5 MG tablet Take 5 mg by mouth daily as needed for erectile  dysfunction.    . traMADol (ULTRAM) 50 MG tablet Take 1 tablet (50 mg total) by mouth every 6 (six) hours as needed. 20 tablet 0   No current facility-administered medications on file prior to visit.     REVIEW OF SYSTEMS: Cardiovascular: No chest pain, chest pressure, palpitations, orthopnea, or dyspnea on exertion. No claudication or rest pain,  No history of DVT or phlebitis. Pulmonary: No productive cough, asthma or wheezing. Hematologic: No bleeding problems or clotting disorders. Musculoskeletal: No joint pain or joint swelling. Gastrointestinal: No blood in stool or hematemesis Genitourinary: No dysuria or hematuria. Psychiatric:: No history of major depression. Integumentary: No rashes or ulcers. Constitutional: No fever or chills.  PHYSICAL EXAMINATION:   Vital signs are BP 150/90 mmHg  Pulse 98  Temp(Src) 98.3 F (36.8 C) (Oral)  Resp 16  Ht 5\' 9"  (1.753 m)  Wt 179 lb (81.194 kg)  BMI 26.42 kg/m2  SpO2 98% General: The patient appears their stated age. HEENT:  No gross abnormalities Pulmonary:  Non labored breathing Musculoskeletal: There are no major deformities. Skin: There are no ulcer or rashes noted. Psychiatric: The patient has normal affect. Cardiovascular: There is a regular rate and rhythm without significant murmur appreciated.  Palpable thrill within left upper arm fistula.  Aneurysmal degeneration within the proximal upper arm with a dry eschar over top.   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  This was scheduled as a staged procedure, having completed the first stage in November.  There is a stenosis within the cephalic vein as it turns down, proximal to the fistula.  I suspect this is contributing to his aneurysmal changes.  Therefore if I can reach this area at the time of his operation  I will try to resect it.  I offered doing his procedure this Friday, however it ended up getting scheduled for January 29.  [name], M.D. [facility] Office: [phone] Pager:  [phone]

## 2014-01-12 ENCOUNTER — Other Ambulatory Visit: Payer: Self-pay

## 2014-01-12 DIAGNOSIS — N2581 Secondary hyperparathyroidism of renal origin: Secondary | ICD-10-CM | POA: Diagnosis not present

## 2014-01-12 DIAGNOSIS — D631 Anemia in chronic kidney disease: Secondary | ICD-10-CM | POA: Diagnosis not present

## 2014-01-12 DIAGNOSIS — N186 End stage renal disease: Secondary | ICD-10-CM | POA: Diagnosis not present

## 2014-01-12 DIAGNOSIS — E119 Type 2 diabetes mellitus without complications: Secondary | ICD-10-CM | POA: Diagnosis not present

## 2014-01-14 DIAGNOSIS — D631 Anemia in chronic kidney disease: Secondary | ICD-10-CM | POA: Diagnosis not present

## 2014-01-14 DIAGNOSIS — N186 End stage renal disease: Secondary | ICD-10-CM | POA: Diagnosis not present

## 2014-01-14 DIAGNOSIS — E119 Type 2 diabetes mellitus without complications: Secondary | ICD-10-CM | POA: Diagnosis not present

## 2014-01-14 DIAGNOSIS — N2581 Secondary hyperparathyroidism of renal origin: Secondary | ICD-10-CM | POA: Diagnosis not present

## 2014-01-15 ENCOUNTER — Encounter: Payer: Medicare Other | Admitting: Cardiovascular Disease

## 2014-01-15 NOTE — Progress Notes (Signed)
No show

## 2014-01-16 DIAGNOSIS — N186 End stage renal disease: Secondary | ICD-10-CM | POA: Diagnosis not present

## 2014-01-16 DIAGNOSIS — E119 Type 2 diabetes mellitus without complications: Secondary | ICD-10-CM | POA: Diagnosis not present

## 2014-01-16 DIAGNOSIS — D631 Anemia in chronic kidney disease: Secondary | ICD-10-CM | POA: Diagnosis not present

## 2014-01-16 DIAGNOSIS — N2581 Secondary hyperparathyroidism of renal origin: Secondary | ICD-10-CM | POA: Diagnosis not present

## 2014-01-19 DIAGNOSIS — N186 End stage renal disease: Secondary | ICD-10-CM | POA: Diagnosis not present

## 2014-01-19 DIAGNOSIS — E119 Type 2 diabetes mellitus without complications: Secondary | ICD-10-CM | POA: Diagnosis not present

## 2014-01-19 DIAGNOSIS — D631 Anemia in chronic kidney disease: Secondary | ICD-10-CM | POA: Diagnosis not present

## 2014-01-19 DIAGNOSIS — N2581 Secondary hyperparathyroidism of renal origin: Secondary | ICD-10-CM | POA: Diagnosis not present

## 2014-01-21 DIAGNOSIS — D631 Anemia in chronic kidney disease: Secondary | ICD-10-CM | POA: Diagnosis not present

## 2014-01-21 DIAGNOSIS — E119 Type 2 diabetes mellitus without complications: Secondary | ICD-10-CM | POA: Diagnosis not present

## 2014-01-21 DIAGNOSIS — N2581 Secondary hyperparathyroidism of renal origin: Secondary | ICD-10-CM | POA: Diagnosis not present

## 2014-01-21 DIAGNOSIS — N186 End stage renal disease: Secondary | ICD-10-CM | POA: Diagnosis not present

## 2014-01-23 DIAGNOSIS — D631 Anemia in chronic kidney disease: Secondary | ICD-10-CM | POA: Diagnosis not present

## 2014-01-23 DIAGNOSIS — E119 Type 2 diabetes mellitus without complications: Secondary | ICD-10-CM | POA: Diagnosis not present

## 2014-01-23 DIAGNOSIS — N2581 Secondary hyperparathyroidism of renal origin: Secondary | ICD-10-CM | POA: Diagnosis not present

## 2014-01-23 DIAGNOSIS — N186 End stage renal disease: Secondary | ICD-10-CM | POA: Diagnosis not present

## 2014-01-26 DIAGNOSIS — D631 Anemia in chronic kidney disease: Secondary | ICD-10-CM | POA: Diagnosis not present

## 2014-01-26 DIAGNOSIS — N186 End stage renal disease: Secondary | ICD-10-CM | POA: Diagnosis not present

## 2014-01-26 DIAGNOSIS — E119 Type 2 diabetes mellitus without complications: Secondary | ICD-10-CM | POA: Diagnosis not present

## 2014-01-26 DIAGNOSIS — N2581 Secondary hyperparathyroidism of renal origin: Secondary | ICD-10-CM | POA: Diagnosis not present

## 2014-01-28 DIAGNOSIS — E119 Type 2 diabetes mellitus without complications: Secondary | ICD-10-CM | POA: Diagnosis not present

## 2014-01-28 DIAGNOSIS — N2581 Secondary hyperparathyroidism of renal origin: Secondary | ICD-10-CM | POA: Diagnosis not present

## 2014-01-28 DIAGNOSIS — D631 Anemia in chronic kidney disease: Secondary | ICD-10-CM | POA: Diagnosis not present

## 2014-01-28 DIAGNOSIS — N186 End stage renal disease: Secondary | ICD-10-CM | POA: Diagnosis not present

## 2014-01-30 DIAGNOSIS — D631 Anemia in chronic kidney disease: Secondary | ICD-10-CM | POA: Diagnosis not present

## 2014-01-30 DIAGNOSIS — E119 Type 2 diabetes mellitus without complications: Secondary | ICD-10-CM | POA: Diagnosis not present

## 2014-01-30 DIAGNOSIS — N186 End stage renal disease: Secondary | ICD-10-CM | POA: Diagnosis not present

## 2014-01-30 DIAGNOSIS — N2581 Secondary hyperparathyroidism of renal origin: Secondary | ICD-10-CM | POA: Diagnosis not present

## 2014-02-02 DIAGNOSIS — E119 Type 2 diabetes mellitus without complications: Secondary | ICD-10-CM | POA: Diagnosis not present

## 2014-02-02 DIAGNOSIS — N186 End stage renal disease: Secondary | ICD-10-CM | POA: Diagnosis not present

## 2014-02-02 DIAGNOSIS — D631 Anemia in chronic kidney disease: Secondary | ICD-10-CM | POA: Diagnosis not present

## 2014-02-02 DIAGNOSIS — N2581 Secondary hyperparathyroidism of renal origin: Secondary | ICD-10-CM | POA: Diagnosis not present

## 2014-02-04 ENCOUNTER — Encounter (HOSPITAL_COMMUNITY): Payer: Self-pay | Admitting: *Deleted

## 2014-02-04 DIAGNOSIS — E119 Type 2 diabetes mellitus without complications: Secondary | ICD-10-CM | POA: Diagnosis not present

## 2014-02-04 DIAGNOSIS — D631 Anemia in chronic kidney disease: Secondary | ICD-10-CM | POA: Diagnosis not present

## 2014-02-04 DIAGNOSIS — N186 End stage renal disease: Secondary | ICD-10-CM | POA: Diagnosis not present

## 2014-02-04 DIAGNOSIS — N2581 Secondary hyperparathyroidism of renal origin: Secondary | ICD-10-CM | POA: Diagnosis not present

## 2014-02-04 MED ORDER — DEXTROSE 5 % IV SOLN
1.5000 g | INTRAVENOUS | Status: AC
  Administered 2014-02-05: 1.5 g via INTRAVENOUS
  Filled 2014-02-04: qty 1.5

## 2014-02-04 NOTE — Progress Notes (Signed)
   02/04/14 1452  OBSTRUCTIVE SLEEP APNEA  Have you ever been diagnosed with sleep apnea through a sleep study? No  Do you snore loudly (loud enough to be heard through closed doors)?  1  Do you often feel tired, fatigued, or sleepy during the daytime? 1  Has anyone observed you stop breathing during your sleep? 0  Do you have, or are you being treated for high blood pressure? 1  BMI more than 35 kg/m2? 0  Age over 52 years old? 1  Gender: 1  Obstructive Sleep Apnea Score 5

## 2014-02-04 NOTE — Anesthesia Preprocedure Evaluation (Addendum)
Anesthesia Evaluation  Patient identified by MRN, date of birth, ID band Patient awake    Reviewed: Allergy & Precautions, NPO status , Patient's Chart, lab work & pertinent test results, reviewed documented beta blocker date and time   History of Anesthesia Complications (+) PROLONGED EMERGENCE and history of anesthetic complications  Airway Mallampati: II  TM Distance: >3 FB     Dental  (+) Teeth Intact, Loose, Dental Advisory Given,    Pulmonary shortness of breath, with exertion and lying, asthma , COPD COPD inhaler, former smoker (quit 2014),    + wheezing      Cardiovascular hypertension, Pt. on medications + Peripheral Vascular Disease and +CHF Rhythm:Regular Rate:Normal  ECHO 09/2013 EF 45% inferior hypkin, PA pressure elevated 55   Neuro/Psych Anxiety Left sided weakness CVA, Residual Symptoms    GI/Hepatic negative GI ROS, Neg liver ROS,   Endo/Other  diabetes, Type 2  Renal/GU DialysisRenal disease     Musculoskeletal   Abdominal (+)  Abdomen: soft. Bowel sounds: normal.  Peds  Hematology  (+) anemia ,   Anesthesia Other Findings ICU admit after last anesthetic, slow awakening, prolonged intubation, LMA required intubation  Reproductive/Obstetrics                       Anesthesia Physical Anesthesia Plan  ASA: III  Anesthesia Plan: General   Post-op Pain Management:    Induction: Intravenous  Airway Management Planned: Oral ETT  Additional Equipment:   Intra-op Plan:   Post-operative Plan: Extubation in OR and Possible Post-op intubation/ventilation  Informed Consent: I have reviewed the patients History and Physical, chart, labs and discussed the procedure including the risks, benefits and alternatives for the proposed anesthesia with the patient or authorized representative who has indicated his/her understanding and acceptance.     Plan Discussed with:   Anesthesia  Plan Comments: (Local with minimal sedation if possible, IF GA would like ET, light anesthesia, BIS, careful relaxation titration)       Anesthesia Quick Evaluation

## 2014-02-04 NOTE — Progress Notes (Signed)
Anesthesia Chart Review:  Pt is same day work up.   Pt is 54 year old male scheduled for repair of AV fistula aneurysm on 02/05/2014 with Dr. Trula Slade.   PMH includes: CHF, HTN, LV dysfunction, mitral regurgitation, stroke (resulting in hemiparesis), ESRD, DM, COPD, anemia. Former smoker.   By report to PAT RN by telephone, pt has concern over prior surgery/anesthesia. Per pt, he was not able to be extubated and had to spend time in ICU. By discharge summary notes, pt had plication and aneurysm resection of L upper arm fistula on 11/10/2013. He required general anesthesia, intubation. He could not be kept still with LMA. Post-operatively, the patient was not alert enough to be extubated. It was suspected that he was slow to clear the anesthesia due to his ESRD. He was kept intubated and transferred to ICU. PCCM was consulted for assistance with vent management. The patient was extubated around 1900 on day of surgery. His vital signs were stable. He was discharged home POD 1.   EKG 11/15/2013: NSR. Left ventricular hypertrophy with QRS widening. Nonspecific ST and T wave abnormality. Prolonged QT.   Echo 09/30/2013: - Left ventricle: The cavity size was normal. There was moderate concentric hypertrophy. Systolic function was mildly to moderately reduced. The estimated ejection fraction was in the range of 40% to 45%. There is hypokinesis of the inferoseptal myocardium. - Mitral valve: Moderately calcified annulus. There was moderate regurgitation. - Left atrium: The atrium was moderately dilated. - Right ventricle: The cavity size was mildly dilated. Wall thickness was normal. - Right atrium: The atrium was mildly dilated. - Pulmonary arteries: PA peak pressure: 58 mm Hg (S). Impressions: - Compared to the prior study, there has been no significant interval change.  Pt will get labs DOS.   Willeen Cass, FNP-BC [facility]/Anesthesiology Phone: [phone] 02/04/2014  3:16 PM

## 2014-02-05 ENCOUNTER — Ambulatory Visit (HOSPITAL_COMMUNITY): Payer: Medicare Other | Admitting: Emergency Medicine

## 2014-02-05 ENCOUNTER — Telehealth: Payer: Self-pay | Admitting: Surgery

## 2014-02-05 ENCOUNTER — Ambulatory Visit (HOSPITAL_COMMUNITY)
Admission: RE | Admit: 2014-02-05 | Discharge: 2014-02-05 | Disposition: A | Discharge status: Home / Self Care | Payer: Medicare Other | Source: Ambulatory Visit | Attending: Surgery | Admitting: Surgery

## 2014-02-05 ENCOUNTER — Encounter (HOSPITAL_COMMUNITY)
Admission: RE | Disposition: A | Discharge status: Home / Self Care | Payer: Self-pay | Source: Ambulatory Visit | Attending: Surgery

## 2014-02-05 ENCOUNTER — Encounter (HOSPITAL_COMMUNITY): Payer: Self-pay | Admitting: *Deleted

## 2014-02-05 DIAGNOSIS — F419 Anxiety disorder, unspecified: Secondary | ICD-10-CM | POA: Diagnosis not present

## 2014-02-05 DIAGNOSIS — I509 Heart failure, unspecified: Secondary | ICD-10-CM | POA: Diagnosis not present

## 2014-02-05 DIAGNOSIS — Z87891 Personal history of nicotine dependence: Secondary | ICD-10-CM | POA: Insufficient documentation

## 2014-02-05 DIAGNOSIS — T82858A Stenosis of vascular prosthetic devices, implants and grafts, initial encounter: Secondary | ICD-10-CM | POA: Insufficient documentation

## 2014-02-05 DIAGNOSIS — Z8673 Personal history of transient ischemic attack (TIA), and cerebral infarction without residual deficits: Secondary | ICD-10-CM | POA: Diagnosis not present

## 2014-02-05 DIAGNOSIS — E119 Type 2 diabetes mellitus without complications: Secondary | ICD-10-CM | POA: Diagnosis not present

## 2014-02-05 DIAGNOSIS — N186 End stage renal disease: Secondary | ICD-10-CM | POA: Diagnosis not present

## 2014-02-05 DIAGNOSIS — I120 Hypertensive chronic kidney disease with stage 5 chronic kidney disease or end stage renal disease: Secondary | ICD-10-CM | POA: Insufficient documentation

## 2014-02-05 DIAGNOSIS — J449 Chronic obstructive pulmonary disease, unspecified: Secondary | ICD-10-CM | POA: Insufficient documentation

## 2014-02-05 DIAGNOSIS — J45909 Unspecified asthma, uncomplicated: Secondary | ICD-10-CM | POA: Diagnosis not present

## 2014-02-05 DIAGNOSIS — I729 Aneurysm of unspecified site: Secondary | ICD-10-CM | POA: Diagnosis not present

## 2014-02-05 DIAGNOSIS — T82898A Other specified complication of vascular prosthetic devices, implants and grafts, initial encounter: Secondary | ICD-10-CM

## 2014-02-05 DIAGNOSIS — Y839 Surgical procedure, unspecified as the cause of abnormal reaction of the patient, or of later complication, without mention of misadventure at the time of the procedure: Secondary | ICD-10-CM | POA: Diagnosis not present

## 2014-02-05 DIAGNOSIS — R0602 Shortness of breath: Secondary | ICD-10-CM | POA: Diagnosis not present

## 2014-02-05 HISTORY — DX: Adverse effect of unspecified anesthetic, initial encounter: T41.45XA

## 2014-02-05 HISTORY — PX: REVISON OF ARTERIOVENOUS FISTULA: SHX6074

## 2014-02-05 LAB — POCT I-STAT 4, (NA,K, GLUC, HGB,HCT)
Glucose, Bld: 96 mg/dL (ref 70–99)
HEMATOCRIT: 35 % — AB (ref 39.0–52.0)
HEMOGLOBIN: 11.9 g/dL — AB (ref 13.0–17.0)
Potassium: 5 mmol/L (ref 3.5–5.1)
SODIUM: 139 mmol/L (ref 135–145)

## 2014-02-05 LAB — GLUCOSE, CAPILLARY
GLUCOSE-CAPILLARY: 86 mg/dL (ref 70–99)
GLUCOSE-CAPILLARY: 110 mg/dL — AB (ref 70–99)
Glucose-Capillary: 139 mg/dL — ABNORMAL HIGH (ref 70–99)

## 2014-02-05 SURGERY — REVISON OF ARTERIOVENOUS FISTULA
Anesthesia: General | Site: Arm Upper | Laterality: Left

## 2014-02-05 MED ORDER — LIDOCAINE-EPINEPHRINE (PF) 1 %-1:200000 IJ SOLN
INTRAMUSCULAR | Status: AC
  Filled 2014-02-05: qty 10

## 2014-02-05 MED ORDER — PHENYLEPHRINE HCL 10 MG/ML IJ SOLN
10.0000 mg | INTRAVENOUS | Status: DC | PRN
  Administered 2014-02-05: 10 ug/min via INTRAVENOUS

## 2014-02-05 MED ORDER — TRAMADOL HCL 50 MG PO TABS: 50.0000 mg | ORAL_TABLET | Freq: Four times a day (QID) | ORAL | Status: DC | PRN

## 2014-02-05 MED ORDER — MIDAZOLAM HCL 2 MG/2ML IJ SOLN
INTRAMUSCULAR | Status: AC
  Filled 2014-02-05: qty 2

## 2014-02-05 MED ORDER — ALBUTEROL SULFATE (2.5 MG/3ML) 0.083% IN NEBU
2.5000 mg | INHALATION_SOLUTION | Freq: Once | RESPIRATORY_TRACT | Status: AC
  Administered 2014-02-05: 2.5 mg via RESPIRATORY_TRACT

## 2014-02-05 MED ORDER — PROPOFOL 10 MG/ML IV BOLUS
INTRAVENOUS | Status: AC
  Filled 2014-02-05: qty 20

## 2014-02-05 MED ORDER — HEPARIN SODIUM (PORCINE) 1000 UNIT/ML IJ SOLN
INTRAMUSCULAR | Status: DC | PRN
  Administered 2014-02-05: 5000 [IU] via INTRAVENOUS

## 2014-02-05 MED ORDER — PROTAMINE SULFATE 10 MG/ML IV SOLN
INTRAVENOUS | Status: DC | PRN
  Administered 2014-02-05: 10 mg via INTRAVENOUS
  Administered 2014-02-05: 5 mg via INTRAVENOUS
  Administered 2014-02-05: 10 mg via INTRAVENOUS

## 2014-02-05 MED ORDER — HEMOSTATIC AGENTS (NO CHARGE) OPTIME
TOPICAL | Status: DC | PRN
  Administered 2014-02-05: 1 via TOPICAL

## 2014-02-05 MED ORDER — VECURONIUM BROMIDE 10 MG IV SOLR
INTRAVENOUS | Status: DC | PRN
  Administered 2014-02-05: 1 mg via INTRAVENOUS
  Administered 2014-02-05: 5 mg via INTRAVENOUS
  Administered 2014-02-05: 1 mg via INTRAVENOUS

## 2014-02-05 MED ORDER — FENTANYL CITRATE 0.05 MG/ML IJ SOLN
INTRAMUSCULAR | Status: AC
  Filled 2014-02-05: qty 5

## 2014-02-05 MED ORDER — MIDAZOLAM HCL 5 MG/5ML IJ SOLN
INTRAMUSCULAR | Status: DC | PRN
  Administered 2014-02-05: .5 mg via INTRAVENOUS

## 2014-02-05 MED ORDER — SUCCINYLCHOLINE CHLORIDE 20 MG/ML IJ SOLN
INTRAMUSCULAR | Status: AC
  Filled 2014-02-05: qty 1

## 2014-02-05 MED ORDER — 0.9 % SODIUM CHLORIDE (POUR BTL) OPTIME
TOPICAL | Status: DC | PRN
  Administered 2014-02-05: 1000 mL

## 2014-02-05 MED ORDER — ONDANSETRON HCL 4 MG/2ML IJ SOLN
INTRAMUSCULAR | Status: DC | PRN
  Administered 2014-02-05: 4 mg via INTRAVENOUS

## 2014-02-05 MED ORDER — LIDOCAINE-EPINEPHRINE (PF) 1 %-1:200000 IJ SOLN
INTRAMUSCULAR | Status: DC | PRN
  Administered 2014-02-05: 30 mL

## 2014-02-05 MED ORDER — ALBUTEROL SULFATE (2.5 MG/3ML) 0.083% IN NEBU
INHALATION_SOLUTION | RESPIRATORY_TRACT | Status: AC
  Filled 2014-02-05: qty 3

## 2014-02-05 MED ORDER — ROCURONIUM BROMIDE 50 MG/5ML IV SOLN
INTRAVENOUS | Status: AC
  Filled 2014-02-05: qty 1

## 2014-02-05 MED ORDER — FENTANYL CITRATE 0.05 MG/ML IJ SOLN
INTRAMUSCULAR | Status: DC | PRN
  Administered 2014-02-05 (×3): 25 ug via INTRAVENOUS

## 2014-02-05 MED ORDER — GLYCOPYRROLATE 0.2 MG/ML IJ SOLN
INTRAMUSCULAR | Status: DC | PRN
  Administered 2014-02-05: 0.6 mg via INTRAVENOUS

## 2014-02-05 MED ORDER — SODIUM CHLORIDE 0.9 % IR SOLN
Status: DC | PRN
  Administered 2014-02-05: 07:00:00

## 2014-02-05 MED ORDER — SODIUM CHLORIDE 0.9 % IV SOLN
INTRAVENOUS | Status: DC
Start: 2014-02-05 — End: 2014-02-05
  Administered 2014-02-05: 08:00:00 via INTRAVENOUS

## 2014-02-05 MED ORDER — PROPOFOL 10 MG/ML IV BOLUS
INTRAVENOUS | Status: DC | PRN
  Administered 2014-02-05: 50 mg via INTRAVENOUS
  Administered 2014-02-05: 20 mg via INTRAVENOUS
  Administered 2014-02-05: 150 mg via INTRAVENOUS
  Administered 2014-02-05: 30 mg via INTRAVENOUS

## 2014-02-05 MED ORDER — LIDOCAINE HCL (CARDIAC) 20 MG/ML IV SOLN
INTRAVENOUS | Status: DC | PRN
  Administered 2014-02-05: 40 mg via INTRAVENOUS

## 2014-02-05 MED ORDER — FENTANYL CITRATE 0.05 MG/ML IJ SOLN: 25.0000 ug | INTRAMUSCULAR | Status: DC | PRN

## 2014-02-05 MED ORDER — PROMETHAZINE HCL 25 MG/ML IJ SOLN: 6.2500 mg | INTRAMUSCULAR | Status: DC | PRN

## 2014-02-05 MED ORDER — NEOSTIGMINE METHYLSULFATE 10 MG/10ML IV SOLN
INTRAVENOUS | Status: AC
  Filled 2014-02-05: qty 1

## 2014-02-05 MED ORDER — PROTAMINE SULFATE 10 MG/ML IV SOLN
INTRAVENOUS | Status: AC
  Filled 2014-02-05: qty 5

## 2014-02-05 MED ORDER — NEOSTIGMINE METHYLSULFATE 10 MG/10ML IV SOLN
INTRAVENOUS | Status: DC | PRN
  Administered 2014-02-05: 4 mg via INTRAVENOUS

## 2014-02-05 MED ORDER — GLYCOPYRROLATE 0.2 MG/ML IJ SOLN
INTRAMUSCULAR | Status: AC
  Filled 2014-02-05: qty 3

## 2014-02-05 MED ORDER — MEPERIDINE HCL 25 MG/ML IJ SOLN: 6.2500 mg | INTRAMUSCULAR | Status: DC | PRN

## 2014-02-05 SURGICAL SUPPLY — 40 items
BANDAGE ELASTIC 6 VELCRO ST LF (GAUZE/BANDAGES/DRESSINGS) ×2 IMPLANT
BLADE SURG 10 STRL SS (BLADE) IMPLANT
CANISTER SUCTION 2500CC (MISCELLANEOUS) ×2 IMPLANT
CLIP TI MEDIUM 6 (CLIP) ×2 IMPLANT
CLIP TI WIDE RED SMALL 6 (CLIP) ×4 IMPLANT
COVER PROBE W GEL 5X96 (DRAPES) ×2 IMPLANT
COVER SURGICAL LIGHT HANDLE (MISCELLANEOUS) ×2 IMPLANT
ELECT REM PT RETURN 9FT ADLT (ELECTROSURGICAL) ×2
ELECTRODE REM PT RTRN 9FT ADLT (ELECTROSURGICAL) ×1 IMPLANT
GLOVE BIO SURGEON STRL SZ 6.5 (GLOVE) ×2 IMPLANT
GLOVE BIO SURGEON STRL SZ7.5 (GLOVE) ×2 IMPLANT
GLOVE BIOGEL PI IND STRL 6.5 (GLOVE) ×2 IMPLANT
GLOVE BIOGEL PI IND STRL 7.0 (GLOVE) ×2 IMPLANT
GLOVE BIOGEL PI IND STRL 7.5 (GLOVE) ×1 IMPLANT
GLOVE BIOGEL PI IND STRL 8 (GLOVE) ×1 IMPLANT
GLOVE BIOGEL PI INDICATOR 6.5 (GLOVE) ×2
GLOVE BIOGEL PI INDICATOR 7.0 (GLOVE) ×2
GLOVE BIOGEL PI INDICATOR 7.5 (GLOVE) ×1
GLOVE BIOGEL PI INDICATOR 8 (GLOVE) ×1
GLOVE ECLIPSE 6.5 STRL STRAW (GLOVE) ×4 IMPLANT
GLOVE SURG SS PI 7.5 STRL IVOR (GLOVE) ×2 IMPLANT
GOWN STRL REUS W/ TWL LRG LVL3 (GOWN DISPOSABLE) ×3 IMPLANT
GOWN STRL REUS W/ TWL XL LVL3 (GOWN DISPOSABLE) ×2 IMPLANT
GOWN STRL REUS W/TWL LRG LVL3 (GOWN DISPOSABLE) ×3
GOWN STRL REUS W/TWL XL LVL3 (GOWN DISPOSABLE) ×2
HEMOSTAT SNOW SURGICEL 2X4 (HEMOSTASIS) ×2 IMPLANT
KIT BASIN OR (CUSTOM PROCEDURE TRAY) ×2 IMPLANT
KIT ROOM TURNOVER OR (KITS) ×2 IMPLANT
LIQUID BAND (GAUZE/BANDAGES/DRESSINGS) ×2 IMPLANT
NS IRRIG 1000ML POUR BTL (IV SOLUTION) ×2 IMPLANT
PACK CV ACCESS (CUSTOM PROCEDURE TRAY) ×2 IMPLANT
PAD ARMBOARD 7.5X6 YLW CONV (MISCELLANEOUS) ×4 IMPLANT
PROBE PENCIL 8 MHZ STRL DISP (MISCELLANEOUS) IMPLANT
SUT PROLENE 5 0 C 1 24 (SUTURE) ×8 IMPLANT
SUT PROLENE 6 0 CC (SUTURE) ×2 IMPLANT
SUT VIC AB 3-0 SH 27 (SUTURE) ×2
SUT VIC AB 3-0 SH 27X BRD (SUTURE) ×2 IMPLANT
SUT VICRYL 4-0 PS2 18IN ABS (SUTURE) ×4 IMPLANT
UNDERPAD 30X30 INCONTINENT (UNDERPADS AND DIAPERS) ×2 IMPLANT
WATER STERILE IRR 1000ML POUR (IV SOLUTION) ×2 IMPLANT

## 2014-02-05 NOTE — Anesthesia Postprocedure Evaluation (Signed)
  Anesthesia Post-op Note  Patient: Nathaniel White  Procedure(s) Performed: Procedure(s): REPAIR OF ARTERIOVENOUS FISTULA ANEURYSM (Left)  Patient Location: PACU  Anesthesia Type:General  Level of Consciousness: awake  Airway and Oxygen Therapy: Patient Spontanous Breathing and Patient connected to nasal cannula oxygen  Post-op Pain: mild  Post-op Assessment: Post-op Vital signs reviewed and Patient's Cardiovascular Status Stable  Post-op Vital Signs: Reviewed  Last Vitals:  Filed Vitals:   02/05/14 1015  BP:   Pulse: 95  Temp:   Resp: 20    Complications: No apparent anesthesia complications

## 2014-02-05 NOTE — Telephone Encounter (Addendum)
-----   Message from Mena Goes, RN sent at 02/05/2014 10:56 AM EST ----- Regarding: Schedule   ----- Message -----    From: Alvia Grove, PA-C    Sent: 02/05/2014   9:49 AM      To: Vvs Charge Pool  S/p aneurysm repair of left upper arm fistula 02/05/14  F/u with Dr. Trula Slade in 2 weeks.  Thanks Kim  02/05/14: lm for pt re appt, dpm

## 2014-02-05 NOTE — H&P (View-Only) (Signed)
Patient name: Nathaniel White MRN: [id number] DOB: 08/05/1960 Sex: male     Chief Complaint  Patient presents with  . Follow-up    Discuss Left UE AVF revision    Has HD on T, Th, and Sat at [place]: The patient is back for follow-up.  On [id number] he underwent plication and aneurysm resection of his left upper arm fistula.  He has a second aneurysm that was not resected so as to avoid placing a catheter.  He has undergone angioplasty of the cephalic vein stenosis in the past, October 2014.  The patient does have a scab over top of his aneurysm in the upper arm.  Dialysis is cannulating his fistula at the site of repair.  Past Medical History  Diagnosis Date  . Hypertension   . Anemia   . Hemiparesis due to old stroke     left  . LV dysfunction     EF 25-30% by echo 07/2011  . MR (mitral regurgitation)     moderate to severe, echo 07/2011  . Shortness of breath   . CHF (congestive heart failure)   . COPD (chronic obstructive pulmonary disease)   . Tobacco abuse   . Stroke     TIA's  . Asthma   . Diabetes mellitus without complication     not on medications  . Anxiety   . ESRD     on HD, M- W- F   . Memory loss due to medical condition     due to stroke    Past Surgical History  Procedure Laterality Date  . Kidney transplant      2011 rejected kidney 2012 back on dialysis  . Av fistula placement    . Cardiac catheterization      [place] without cardioversion  08/17/2011    Procedure: TRANSESOPHAGEAL ECHOCARDIOGRAM (TEE);  Surgeon: Larey Dresser, MD;  Location: [place];  Service: Cardiovascular;  Laterality: N/A;  . [place]ison of arteriovenous fistula Left 08/26/2013    Procedure: EXCISION OF ERODED SKIN AND EXPLORATION OF MAIN LEFT UPPER ARM AV FISTULA;  Surgeon: Rosetta Posner, MD;  Location: [place];  Service: Vascular;  Laterality: Left;  . Video assisted thoracoscopy (vats)/decortication  08/10/11  . Fistula  superficialization Left 11/10/2013    Procedure: FISTULA PLICATION;  Surgeon: Serafina Mitchell, MD;  Location: [place];  Service: Vascular;  Laterality: Left;  . Shuntogram N/A 11/05/2012    Procedure: Fistulogram;  Surgeon: Serafina Mitchell, MD;  Location: [facility] CATH LAB;  Service: Cardiovascular;  Laterality: N/A;  . Left and right heart catheterization with coronary angiogram N/A 10/09/2013    Procedure: LEFT AND RIGHT HEART CATHETERIZATION WITH CORONARY ANGIOGRAM;  Surgeon: Burnell Blanks, MD;  Location: [facility] CATH LAB;  Service: Cardiovascular;  Laterality: N/A;    History   Social History  . Marital Status: Single    Spouse Name: N/A    Number of Children: 1  . Years of Education: N/A   Occupational History  . Retired-[employer]    Social History Main Topics  . Smoking status: Former Smoker -- 0.50 packs/day for 32 years    Types: Cigarettes    Quit date: 10/13/2012  . Smokeless tobacco: Never Used  . Alcohol Use: No  . Drug Use: No  . Sexual Activity: Not on file   Other Topics Concern  . Not on file   Social  History Narrative    Family History  Problem Relation Age of Onset  . Hypertension Mother   . Varicose Veins Mother   . Diabetes Paternal Grandmother   . Cancer Paternal Grandfather   . CAD Paternal Uncle     Allergies as of 01/11/2014 - Review Complete 01/11/2014  Allergen Reaction Noted  . Codeine Shortness Of Breath 09/29/2008    Current Outpatient Prescriptions on File Prior to Visit  Medication Sig Dispense Refill  . albuterol (PROVENTIL HFA;VENTOLIN HFA) 108 (90 BASE) MCG/ACT inhaler Inhale 1-2 puffs into the lungs every 6 (six) hours as needed for wheezing. 1 Inhaler 0  . Fluticasone-Salmeterol (ADVAIR) 250-50 MCG/DOSE AEPB Inhale 2 puffs into the lungs daily at 12 noon.    . labetalol (NORMODYNE) 300 MG tablet Take 300 mg by mouth 2 (two) times daily.     . tadalafil (CIALIS) 5 MG tablet Take 5 mg by mouth daily as needed for erectile  dysfunction.    . traMADol (ULTRAM) 50 MG tablet Take 1 tablet (50 mg total) by mouth every 6 (six) hours as needed. 20 tablet 0   No current facility-administered medications on file prior to visit.     REVIEW OF SYSTEMS: Cardiovascular: No chest pain, chest pressure, palpitations, orthopnea, or dyspnea on exertion. No claudication or rest pain,  No history of DVT or phlebitis. Pulmonary: No productive cough, asthma or wheezing. Hematologic: No bleeding problems or clotting disorders. Musculoskeletal: No joint pain or joint swelling. Gastrointestinal: No blood in stool or hematemesis Genitourinary: No dysuria or hematuria. Psychiatric:: No history of major depression. Integumentary: No rashes or ulcers. Constitutional: No fever or chills.  PHYSICAL EXAMINATION:   Vital signs are BP 150/90 mmHg  Pulse 98  Temp(Src) 98.3 F (36.8 C) (Oral)  Resp 16  Ht 5\' 9"  (1.753 m)  Wt 179 lb (81.194 kg)  BMI 26.42 kg/m2  SpO2 98% General: The patient appears their stated age. HEENT:  No gross abnormalities Pulmonary:  Non labored breathing Musculoskeletal: There are no major deformities. Skin: There are no ulcer or rashes noted. Psychiatric: The patient has normal affect. Cardiovascular: There is a regular rate and rhythm without significant murmur appreciated.  Palpable thrill within left upper arm fistula.  Aneurysmal degeneration within the proximal upper arm with a dry eschar over top.   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  This was scheduled as a staged procedure, having completed the first stage in November.  There is a stenosis within the cephalic vein as it turns down, proximal to the fistula.  I suspect this is contributing to his aneurysmal changes.  Therefore if I can reach this area at the time of his operation  I will try to resect it.  I offered doing his procedure this Friday, however it ended up getting scheduled for January 29.  [name], M.D. [facility] [place] Office: [phone] Pager:  [phone]

## 2014-02-05 NOTE — Progress Notes (Signed)
Dr Tresa Moore at bedside

## 2014-02-05 NOTE — Discharge Instructions (Signed)
What to eat: ° °For your first meals, you should eat lightly; only small meals initially.  If you do not have nausea, you may eat larger meals.  Avoid spicy, greasy and heavy food.   ° °General Anesthesia, Adult, Care After  °Refer to this sheet in the next few weeks. These instructions provide you with information on caring for yourself after your procedure. Your health care provider may also give you more specific instructions. Your treatment has been planned according to current medical practices, but problems sometimes occur. Call your health care provider if you have any problems or questions after your procedure.  °WHAT TO EXPECT AFTER THE PROCEDURE  °After the procedure, it is typical to experience:  °Sleepiness.  °Nausea and vomiting. °HOME CARE INSTRUCTIONS  °For the first 24 hours after general anesthesia:  °Have a responsible person with you.  °Do not drive a car. If you are alone, do not take public transportation.  °Do not drink alcohol.  °Do not take medicine that has not been prescribed by your health care provider.  °Do not sign important papers or make important decisions.  °You may resume a normal diet and activities as directed by your health care provider.  °Change bandages (dressings) as directed.  °If you have questions or problems that seem related to general anesthesia, call the hospital and ask for the anesthetist or anesthesiologist on call. °SEEK MEDICAL CARE IF:  °You have nausea and vomiting that continue the day after anesthesia.  °You develop a rash. °SEEK IMMEDIATE MEDICAL CARE IF:  °You have difficulty breathing.  °You have chest pain.  °You have any allergic problems. °Document Released: 04/02/2000 Document Revised: 08/27/2012 Document Reviewed: 07/10/2012  °ExitCare® Patient Information ©2014 [employer].  ° °Tissue Adhesive Wound Care  ° ° °Some cuts and wounds can be closed with tissue adhesive. Adhesive is like glue. It holds the skin together and helps a wound heal faster.  This adhesive goes away on its own as the wound heals.  °HOME CARE  °Showers are allowed. Do not soak the wound in water. Do not take baths, swim, or use hot tubs. Do not use soaps or creams on your wound.  °If a bandage (dressing) was put on, change it as often as told by your doctor.  °Keep the bandage dry.  °Do not scratch, pick, or rub the adhesive.  °Do not put tape over the adhesive. The adhesive could come off.  °Protect the wound from another injury.  °Protect the wound from sun and tanning beds.  °Only take medicine as told by your doctor.  °Keep all doctor visits as told. °GET HELP RIGHT AWAY IF:  °Your wound is red, puffy (swollen), hot, or tender.  °You get a rash after the glue is put on.  °You have more pain in the wound.  °You have a red streak going away from the wound.  °You have yellowish-white fluid (pus) coming from the wound.  °You have more bleeding.  °You have a fever.  °You have chills and start to shake.  °You notice a bad smell coming from the wound.  °Your wound or adhesive breaks open. °MAKE SURE YOU:  °Understand these instructions.  °Will watch your condition.  °Will get help right away if you are not doing well or get worse. °Document Released: 10/04/2007 Document Revised: 10/15/2012 Document Reviewed: 07/16/2012  °ExitCare® Patient Information ©2015 [employer]. This information is not intended to replace advice given to you by your health care provider.   Make sure you discuss any questions you have with your health care provider.  ° ° °

## 2014-02-05 NOTE — Op Note (Signed)
    Patient name: Nathaniel White MRN: [id number] DOB: 08-Nov-1960 Sex: male  02/05/2014 Pre-operative Diagnosis: Left upper arm fistula aneurysm and stenosis Post-operative diagnosis:  Same Surgeon:  [name] Assistants:  Silva Bandy Procedure:   #1: Aneurysmorrhaphy with plication, left upper arm fistula   #2: Resection with primary anastomosis, left upper arm fistula Anesthesia:  Gen. Blood Loss:  See anesthesia record Specimens:  None  Findings:  Approximately 60% of the aneurysm was resected and then closed anteriorly.  A short segment stenosis in the cephalic vein was also resected with a primary end to end anastomosis.  Indications:  The patient has previously undergone plication of his more distal aneurysm within his fistula.  He comes back today for repair of the more proximal aneurysm as well as possible treatment of a stenotic section of his fistula.  Procedu re:  The patient was identified in the holding area and taken to [place]  The patient was then placed supine on the table. general anesthesia was administered.  The patient was prepped and draped in the usual sterile fashion.  A time out was called and antibiotics were administeincision was made anterior to the aneurysm and his left upper arm cephalic vein fistula.  The aneurysm was circumferentially mobilized with Bovie cautery.  Once it was circumferentially exposed, I turned my attention towards the proximal cephalic vein as it joins the axillary vein.  This corresponded with an area of stenosis that had previously been treated on fistulogram imaging.  I made a longitudinal incision up near the axilla.  I exposed the fistula from this incision.  There was clearly a 2 cm stenotic segment.  I mobilized the cephalic vein fistula proximal and distal to this stenosis.  Next, the patient was fully heparinized.  After the heparin had circulated the fistula was occluded.  I opened the aneurysm along its anterior surface.  I  then used Metzenbaum scissors to transect approximately 60% of the aneurysm.  I then used a 5-0 Prolene to close the anterior surface and a running fashion.  Once this was done, bloodflow was reestablish.  The repair was hemostatic.  I then reoccluded the fistula.  I then transected the 2-3 centimeters segment of the cephalic vein fistula that corresponded to the area of stenosis.  On inspecting this area there was thickened neointima.  The stenotic segment was resected in its entirety.  I then performed a primary anastomosis of the 2 transected ends, this was done and a end to end fashion with 5-0 Prolene.  Once this was completed the clamps were released.  There was excellent thrill within the fistula.  I reversed the heparin with protamine.  I then transected an ellipse of skin approximately 2 cm in diameter where the fistula aneurysm was previously located.  Once hemostasis was satisfactory, I reapproximated the subcutaneous tissue within each incision with a 3-0 Vicryl followed by 4-0 Vicryl on the skin.  There were no immediate complications.  Dermabond was applied.   Disposition:  To PACU in stable condition.   Theotis Burrow, M.D. [facility] [place] Office: [phone] Pager:  [phone]

## 2014-02-05 NOTE — Anesthesia Procedure Notes (Signed)
Procedure Name: Intubation Date/Time: 02/05/2014 7:43 AM Performed by: Maeola Harman Pre-anesthesia Checklist: Patient identified, Emergency Drugs available, Suction available, Patient being monitored and Timeout performed Patient Re-evaluated:Patient Re-evaluated prior to inductionOxygen Delivery Method: Circle system utilized Preoxygenation: Pre-oxygenation with 100% oxygen Intubation Type: IV induction Ventilation: Mask ventilation without difficulty Laryngoscope Size: Mac and 3 Grade View: Grade IV Tube type: Oral Tube size: 7.5 mm Number of attempts: 2 Airway Equipment and Method: Stylet and Video-laryngoscopy Placement Confirmation: ETT inserted through vocal cords under direct vision,  positive ETCO2 and breath sounds checked- equal and bilateral Secured at: 22 cm Tube secured with: Tape Dental Injury: Teeth and Oropharynx as per pre-operative assessment  Difficulty Due To: Difficulty was anticipated, Difficult Airway- due to immobile epiglottis, Difficult Airway- due to limited oral opening and Difficult Airway- due to dentition Future Recommendations: Recommend- induction with short-acting agent, and alternative techniques readily available Comments: Easy induction and mask ventilation.  Quick look with MAC 3 blade with Grade IV view noted.  Switched to videolaryngoscope and stylet with good view of cords, ETT passed easily.  Dr. Tresa Moore verified placement.  Waldron Session, CRNA

## 2014-02-05 NOTE — Interval H&P Note (Signed)
History and Physical Interval Note:  02/05/2014 7:25 AM  Nathaniel White  has presented today for surgery, with the diagnosis of End Stage Renal Disease N18.6; Left upper extremity arteriovenous fistula aneurysm I72.9  The various methods of treatment have been discussed with the patient and family. After consideration of risks, benefits and other options for treatment, the patient has consented to  Procedure(s): REPAIR OF ARTERIOVENOUS FISTULA ANEURYSM (Left) as a surgical intervention .  The patient's history has been reviewed, patient examined, no change in status, stable for surgery.  I have reviewed the patient's chart and labs.  Questions were answered to the patient's satisfaction.     BRABHAM IV, V. WELLS

## 2014-02-05 NOTE — Transfer of Care (Signed)
Immediate Anesthesia Transfer of Care Note  Patient: Nathaniel White  Procedure(s) Performed: Procedure(s): REPAIR OF ARTERIOVENOUS FISTULA ANEURYSM (Left)  Patient Location: PACU  Anesthesia Type:General  Level of Consciousness: awake, alert  and sedated  Airway & Oxygen Therapy: Patient connected to face mask oxygen  Post-op Assessment: Report given to RN  Post vital signs: stable  Last Vitals:  Filed Vitals:   02/05/14 1011  BP: 138/83  Pulse: 97  Temp:   Resp: 19    Complications: No apparent anesthesia complications

## 2014-02-05 NOTE — Progress Notes (Signed)
Albuterol neb given per Dr Tresa Moore

## 2014-02-06 DIAGNOSIS — E119 Type 2 diabetes mellitus without complications: Secondary | ICD-10-CM | POA: Diagnosis not present

## 2014-02-06 DIAGNOSIS — D631 Anemia in chronic kidney disease: Secondary | ICD-10-CM | POA: Diagnosis not present

## 2014-02-06 DIAGNOSIS — N2581 Secondary hyperparathyroidism of renal origin: Secondary | ICD-10-CM | POA: Diagnosis not present

## 2014-02-06 DIAGNOSIS — N186 End stage renal disease: Secondary | ICD-10-CM | POA: Diagnosis not present

## 2014-02-07 DIAGNOSIS — N186 End stage renal disease: Secondary | ICD-10-CM | POA: Diagnosis not present

## 2014-02-07 DIAGNOSIS — Z992 Dependence on renal dialysis: Secondary | ICD-10-CM | POA: Diagnosis not present

## 2014-02-08 ENCOUNTER — Encounter (HOSPITAL_COMMUNITY): Payer: Self-pay | Admitting: Surgery

## 2014-02-09 DIAGNOSIS — N2581 Secondary hyperparathyroidism of renal origin: Secondary | ICD-10-CM | POA: Diagnosis not present

## 2014-02-09 DIAGNOSIS — E119 Type 2 diabetes mellitus without complications: Secondary | ICD-10-CM | POA: Diagnosis not present

## 2014-02-09 DIAGNOSIS — N186 End stage renal disease: Secondary | ICD-10-CM | POA: Diagnosis not present

## 2014-02-09 DIAGNOSIS — D631 Anemia in chronic kidney disease: Secondary | ICD-10-CM | POA: Diagnosis not present

## 2014-02-11 DIAGNOSIS — E119 Type 2 diabetes mellitus without complications: Secondary | ICD-10-CM | POA: Diagnosis not present

## 2014-02-11 DIAGNOSIS — N2581 Secondary hyperparathyroidism of renal origin: Secondary | ICD-10-CM | POA: Diagnosis not present

## 2014-02-11 DIAGNOSIS — N186 End stage renal disease: Secondary | ICD-10-CM | POA: Diagnosis not present

## 2014-02-11 DIAGNOSIS — D631 Anemia in chronic kidney disease: Secondary | ICD-10-CM | POA: Diagnosis not present

## 2014-02-13 DIAGNOSIS — N186 End stage renal disease: Secondary | ICD-10-CM | POA: Diagnosis not present

## 2014-02-13 DIAGNOSIS — E119 Type 2 diabetes mellitus without complications: Secondary | ICD-10-CM | POA: Diagnosis not present

## 2014-02-13 DIAGNOSIS — N2581 Secondary hyperparathyroidism of renal origin: Secondary | ICD-10-CM | POA: Diagnosis not present

## 2014-02-13 DIAGNOSIS — D631 Anemia in chronic kidney disease: Secondary | ICD-10-CM | POA: Diagnosis not present

## 2014-02-16 DIAGNOSIS — D631 Anemia in chronic kidney disease: Secondary | ICD-10-CM | POA: Diagnosis not present

## 2014-02-16 DIAGNOSIS — N2581 Secondary hyperparathyroidism of renal origin: Secondary | ICD-10-CM | POA: Diagnosis not present

## 2014-02-16 DIAGNOSIS — N186 End stage renal disease: Secondary | ICD-10-CM | POA: Diagnosis not present

## 2014-02-16 DIAGNOSIS — E119 Type 2 diabetes mellitus without complications: Secondary | ICD-10-CM | POA: Diagnosis not present

## 2014-02-18 DIAGNOSIS — E119 Type 2 diabetes mellitus without complications: Secondary | ICD-10-CM | POA: Diagnosis not present

## 2014-02-18 DIAGNOSIS — N186 End stage renal disease: Secondary | ICD-10-CM | POA: Diagnosis not present

## 2014-02-18 DIAGNOSIS — N2581 Secondary hyperparathyroidism of renal origin: Secondary | ICD-10-CM | POA: Diagnosis not present

## 2014-02-18 DIAGNOSIS — D631 Anemia in chronic kidney disease: Secondary | ICD-10-CM | POA: Diagnosis not present

## 2014-02-19 ENCOUNTER — Encounter: Payer: Self-pay | Admitting: Surgery

## 2014-02-20 DIAGNOSIS — D631 Anemia in chronic kidney disease: Secondary | ICD-10-CM | POA: Diagnosis not present

## 2014-02-20 DIAGNOSIS — E119 Type 2 diabetes mellitus without complications: Secondary | ICD-10-CM | POA: Diagnosis not present

## 2014-02-20 DIAGNOSIS — N2581 Secondary hyperparathyroidism of renal origin: Secondary | ICD-10-CM | POA: Diagnosis not present

## 2014-02-20 DIAGNOSIS — N186 End stage renal disease: Secondary | ICD-10-CM | POA: Diagnosis not present

## 2014-02-22 ENCOUNTER — Encounter: Payer: Medicare Other | Admitting: Surgery

## 2014-02-23 DIAGNOSIS — N2581 Secondary hyperparathyroidism of renal origin: Secondary | ICD-10-CM | POA: Diagnosis not present

## 2014-02-23 DIAGNOSIS — E119 Type 2 diabetes mellitus without complications: Secondary | ICD-10-CM | POA: Diagnosis not present

## 2014-02-23 DIAGNOSIS — N186 End stage renal disease: Secondary | ICD-10-CM | POA: Diagnosis not present

## 2014-02-23 DIAGNOSIS — D631 Anemia in chronic kidney disease: Secondary | ICD-10-CM | POA: Diagnosis not present

## 2014-02-25 DIAGNOSIS — N186 End stage renal disease: Secondary | ICD-10-CM | POA: Diagnosis not present

## 2014-02-25 DIAGNOSIS — N2581 Secondary hyperparathyroidism of renal origin: Secondary | ICD-10-CM | POA: Diagnosis not present

## 2014-02-25 DIAGNOSIS — D631 Anemia in chronic kidney disease: Secondary | ICD-10-CM | POA: Diagnosis not present

## 2014-02-25 DIAGNOSIS — E119 Type 2 diabetes mellitus without complications: Secondary | ICD-10-CM | POA: Diagnosis not present

## 2014-02-27 DIAGNOSIS — D631 Anemia in chronic kidney disease: Secondary | ICD-10-CM | POA: Diagnosis not present

## 2014-02-27 DIAGNOSIS — E119 Type 2 diabetes mellitus without complications: Secondary | ICD-10-CM | POA: Diagnosis not present

## 2014-02-27 DIAGNOSIS — N2581 Secondary hyperparathyroidism of renal origin: Secondary | ICD-10-CM | POA: Diagnosis not present

## 2014-02-27 DIAGNOSIS — N186 End stage renal disease: Secondary | ICD-10-CM | POA: Diagnosis not present

## 2014-03-02 DIAGNOSIS — N2581 Secondary hyperparathyroidism of renal origin: Secondary | ICD-10-CM | POA: Diagnosis not present

## 2014-03-02 DIAGNOSIS — N186 End stage renal disease: Secondary | ICD-10-CM | POA: Diagnosis not present

## 2014-03-02 DIAGNOSIS — E119 Type 2 diabetes mellitus without complications: Secondary | ICD-10-CM | POA: Diagnosis not present

## 2014-03-02 DIAGNOSIS — D631 Anemia in chronic kidney disease: Secondary | ICD-10-CM | POA: Diagnosis not present

## 2014-03-04 ENCOUNTER — Encounter: Payer: Self-pay | Admitting: Surgery

## 2014-03-04 DIAGNOSIS — N2581 Secondary hyperparathyroidism of renal origin: Secondary | ICD-10-CM | POA: Diagnosis not present

## 2014-03-04 DIAGNOSIS — N186 End stage renal disease: Secondary | ICD-10-CM | POA: Diagnosis not present

## 2014-03-04 DIAGNOSIS — E119 Type 2 diabetes mellitus without complications: Secondary | ICD-10-CM | POA: Diagnosis not present

## 2014-03-04 DIAGNOSIS — D631 Anemia in chronic kidney disease: Secondary | ICD-10-CM | POA: Diagnosis not present

## 2014-03-05 ENCOUNTER — Ambulatory Visit (INDEPENDENT_AMBULATORY_CARE_PROVIDER_SITE_OTHER): Payer: Medicare Other | Admitting: Surgery

## 2014-03-05 ENCOUNTER — Encounter: Payer: Self-pay | Admitting: Surgery

## 2014-03-05 VITALS — BP 152/83 | HR 101 | Resp 16 | Ht 69.0 in | Wt 180.0 lb

## 2014-03-05 DIAGNOSIS — T829XXA Unspecified complication of cardiac and vascular prosthetic device, implant and graft, initial encounter: Secondary | ICD-10-CM

## 2014-03-05 DIAGNOSIS — N186 End stage renal disease: Secondary | ICD-10-CM

## 2014-03-05 DIAGNOSIS — Y841 Kidney dialysis as the cause of abnormal reaction of the patient, or of later complication, without mention of misadventure at the time of the procedure: Secondary | ICD-10-CM

## 2014-03-05 NOTE — Progress Notes (Signed)
The patient is back for his first postoperative follow-up.  On 02/05/2014, the patient underwent aneurysmorrhaphy of his left upper arm fistula.  I'll also perform resection with primary anastomosis of the stenotic area.  This was his second aneurysmorrhaphy.  Previously he had undergone repair of a second aneurysm closer to the antecubital crease.  There is an excellent thrill within the fistula.  There is no evidence of steal syndrome.  All the wounds have healed.  Patient is doing very well.  The surgical site can be cannulated starting in 2 weeks.  Nathaniel White

## 2014-03-06 DIAGNOSIS — E119 Type 2 diabetes mellitus without complications: Secondary | ICD-10-CM | POA: Diagnosis not present

## 2014-03-06 DIAGNOSIS — N186 End stage renal disease: Secondary | ICD-10-CM | POA: Diagnosis not present

## 2014-03-06 DIAGNOSIS — N2581 Secondary hyperparathyroidism of renal origin: Secondary | ICD-10-CM | POA: Diagnosis not present

## 2014-03-06 DIAGNOSIS — D631 Anemia in chronic kidney disease: Secondary | ICD-10-CM | POA: Diagnosis not present

## 2014-03-08 DIAGNOSIS — Z992 Dependence on renal dialysis: Secondary | ICD-10-CM | POA: Diagnosis not present

## 2014-03-08 DIAGNOSIS — N186 End stage renal disease: Secondary | ICD-10-CM | POA: Diagnosis not present

## 2014-03-09 DIAGNOSIS — D509 Iron deficiency anemia, unspecified: Secondary | ICD-10-CM | POA: Diagnosis not present

## 2014-03-09 DIAGNOSIS — N2581 Secondary hyperparathyroidism of renal origin: Secondary | ICD-10-CM | POA: Diagnosis not present

## 2014-03-09 DIAGNOSIS — E119 Type 2 diabetes mellitus without complications: Secondary | ICD-10-CM | POA: Diagnosis not present

## 2014-03-09 DIAGNOSIS — N186 End stage renal disease: Secondary | ICD-10-CM | POA: Diagnosis not present

## 2014-03-09 DIAGNOSIS — D631 Anemia in chronic kidney disease: Secondary | ICD-10-CM | POA: Diagnosis not present

## 2014-03-11 ENCOUNTER — Encounter: Payer: Self-pay | Admitting: Gastroenterology

## 2014-03-11 DIAGNOSIS — D631 Anemia in chronic kidney disease: Secondary | ICD-10-CM | POA: Diagnosis not present

## 2014-03-11 DIAGNOSIS — N2581 Secondary hyperparathyroidism of renal origin: Secondary | ICD-10-CM | POA: Diagnosis not present

## 2014-03-11 DIAGNOSIS — E119 Type 2 diabetes mellitus without complications: Secondary | ICD-10-CM | POA: Diagnosis not present

## 2014-03-11 DIAGNOSIS — N186 End stage renal disease: Secondary | ICD-10-CM | POA: Diagnosis not present

## 2014-03-11 DIAGNOSIS — D509 Iron deficiency anemia, unspecified: Secondary | ICD-10-CM | POA: Diagnosis not present

## 2014-03-13 DIAGNOSIS — N2581 Secondary hyperparathyroidism of renal origin: Secondary | ICD-10-CM | POA: Diagnosis not present

## 2014-03-13 DIAGNOSIS — E119 Type 2 diabetes mellitus without complications: Secondary | ICD-10-CM | POA: Diagnosis not present

## 2014-03-13 DIAGNOSIS — D509 Iron deficiency anemia, unspecified: Secondary | ICD-10-CM | POA: Diagnosis not present

## 2014-03-13 DIAGNOSIS — D631 Anemia in chronic kidney disease: Secondary | ICD-10-CM | POA: Diagnosis not present

## 2014-03-13 DIAGNOSIS — N186 End stage renal disease: Secondary | ICD-10-CM | POA: Diagnosis not present

## 2014-03-16 DIAGNOSIS — D631 Anemia in chronic kidney disease: Secondary | ICD-10-CM | POA: Diagnosis not present

## 2014-03-16 DIAGNOSIS — N2581 Secondary hyperparathyroidism of renal origin: Secondary | ICD-10-CM | POA: Diagnosis not present

## 2014-03-16 DIAGNOSIS — D509 Iron deficiency anemia, unspecified: Secondary | ICD-10-CM | POA: Diagnosis not present

## 2014-03-16 DIAGNOSIS — N186 End stage renal disease: Secondary | ICD-10-CM | POA: Diagnosis not present

## 2014-03-16 DIAGNOSIS — E119 Type 2 diabetes mellitus without complications: Secondary | ICD-10-CM | POA: Diagnosis not present

## 2014-03-18 DIAGNOSIS — N2581 Secondary hyperparathyroidism of renal origin: Secondary | ICD-10-CM | POA: Diagnosis not present

## 2014-03-18 DIAGNOSIS — D631 Anemia in chronic kidney disease: Secondary | ICD-10-CM | POA: Diagnosis not present

## 2014-03-18 DIAGNOSIS — D509 Iron deficiency anemia, unspecified: Secondary | ICD-10-CM | POA: Diagnosis not present

## 2014-03-18 DIAGNOSIS — N186 End stage renal disease: Secondary | ICD-10-CM | POA: Diagnosis not present

## 2014-03-18 DIAGNOSIS — E119 Type 2 diabetes mellitus without complications: Secondary | ICD-10-CM | POA: Diagnosis not present

## 2014-03-20 DIAGNOSIS — N186 End stage renal disease: Secondary | ICD-10-CM | POA: Diagnosis not present

## 2014-03-20 DIAGNOSIS — D509 Iron deficiency anemia, unspecified: Secondary | ICD-10-CM | POA: Diagnosis not present

## 2014-03-20 DIAGNOSIS — D631 Anemia in chronic kidney disease: Secondary | ICD-10-CM | POA: Diagnosis not present

## 2014-03-20 DIAGNOSIS — E119 Type 2 diabetes mellitus without complications: Secondary | ICD-10-CM | POA: Diagnosis not present

## 2014-03-20 DIAGNOSIS — N2581 Secondary hyperparathyroidism of renal origin: Secondary | ICD-10-CM | POA: Diagnosis not present

## 2014-03-23 DIAGNOSIS — D631 Anemia in chronic kidney disease: Secondary | ICD-10-CM | POA: Diagnosis not present

## 2014-03-23 DIAGNOSIS — N186 End stage renal disease: Secondary | ICD-10-CM | POA: Diagnosis not present

## 2014-03-23 DIAGNOSIS — E119 Type 2 diabetes mellitus without complications: Secondary | ICD-10-CM | POA: Diagnosis not present

## 2014-03-23 DIAGNOSIS — N2581 Secondary hyperparathyroidism of renal origin: Secondary | ICD-10-CM | POA: Diagnosis not present

## 2014-03-23 DIAGNOSIS — D509 Iron deficiency anemia, unspecified: Secondary | ICD-10-CM | POA: Diagnosis not present

## 2014-03-25 DIAGNOSIS — N186 End stage renal disease: Secondary | ICD-10-CM | POA: Diagnosis not present

## 2014-03-25 DIAGNOSIS — E119 Type 2 diabetes mellitus without complications: Secondary | ICD-10-CM | POA: Diagnosis not present

## 2014-03-25 DIAGNOSIS — N2581 Secondary hyperparathyroidism of renal origin: Secondary | ICD-10-CM | POA: Diagnosis not present

## 2014-03-25 DIAGNOSIS — D631 Anemia in chronic kidney disease: Secondary | ICD-10-CM | POA: Diagnosis not present

## 2014-03-25 DIAGNOSIS — D509 Iron deficiency anemia, unspecified: Secondary | ICD-10-CM | POA: Diagnosis not present

## 2014-03-26 DIAGNOSIS — Z992 Dependence on renal dialysis: Secondary | ICD-10-CM | POA: Insufficient documentation

## 2014-03-26 DIAGNOSIS — Z4901 Encounter for fitting and adjustment of extracorporeal dialysis catheter: Secondary | ICD-10-CM | POA: Insufficient documentation

## 2014-03-27 DIAGNOSIS — N2581 Secondary hyperparathyroidism of renal origin: Secondary | ICD-10-CM | POA: Diagnosis not present

## 2014-03-27 DIAGNOSIS — N186 End stage renal disease: Secondary | ICD-10-CM | POA: Diagnosis not present

## 2014-03-27 DIAGNOSIS — D509 Iron deficiency anemia, unspecified: Secondary | ICD-10-CM | POA: Diagnosis not present

## 2014-03-27 DIAGNOSIS — D631 Anemia in chronic kidney disease: Secondary | ICD-10-CM | POA: Diagnosis not present

## 2014-03-27 DIAGNOSIS — E119 Type 2 diabetes mellitus without complications: Secondary | ICD-10-CM | POA: Diagnosis not present

## 2014-03-30 DIAGNOSIS — D631 Anemia in chronic kidney disease: Secondary | ICD-10-CM | POA: Diagnosis not present

## 2014-03-30 DIAGNOSIS — E119 Type 2 diabetes mellitus without complications: Secondary | ICD-10-CM | POA: Diagnosis not present

## 2014-03-30 DIAGNOSIS — D509 Iron deficiency anemia, unspecified: Secondary | ICD-10-CM | POA: Diagnosis not present

## 2014-03-30 DIAGNOSIS — N2581 Secondary hyperparathyroidism of renal origin: Secondary | ICD-10-CM | POA: Diagnosis not present

## 2014-03-30 DIAGNOSIS — N186 End stage renal disease: Secondary | ICD-10-CM | POA: Diagnosis not present

## 2014-04-01 DIAGNOSIS — N2581 Secondary hyperparathyroidism of renal origin: Secondary | ICD-10-CM | POA: Diagnosis not present

## 2014-04-01 DIAGNOSIS — E119 Type 2 diabetes mellitus without complications: Secondary | ICD-10-CM | POA: Diagnosis not present

## 2014-04-01 DIAGNOSIS — D631 Anemia in chronic kidney disease: Secondary | ICD-10-CM | POA: Diagnosis not present

## 2014-04-01 DIAGNOSIS — N186 End stage renal disease: Secondary | ICD-10-CM | POA: Diagnosis not present

## 2014-04-01 DIAGNOSIS — D509 Iron deficiency anemia, unspecified: Secondary | ICD-10-CM | POA: Diagnosis not present

## 2014-04-03 DIAGNOSIS — N186 End stage renal disease: Secondary | ICD-10-CM | POA: Diagnosis not present

## 2014-04-03 DIAGNOSIS — D509 Iron deficiency anemia, unspecified: Secondary | ICD-10-CM | POA: Diagnosis not present

## 2014-04-03 DIAGNOSIS — E119 Type 2 diabetes mellitus without complications: Secondary | ICD-10-CM | POA: Diagnosis not present

## 2014-04-03 DIAGNOSIS — N2581 Secondary hyperparathyroidism of renal origin: Secondary | ICD-10-CM | POA: Diagnosis not present

## 2014-04-03 DIAGNOSIS — D631 Anemia in chronic kidney disease: Secondary | ICD-10-CM | POA: Diagnosis not present

## 2014-04-06 DIAGNOSIS — N186 End stage renal disease: Secondary | ICD-10-CM | POA: Diagnosis not present

## 2014-04-06 DIAGNOSIS — D631 Anemia in chronic kidney disease: Secondary | ICD-10-CM | POA: Diagnosis not present

## 2014-04-06 DIAGNOSIS — N2581 Secondary hyperparathyroidism of renal origin: Secondary | ICD-10-CM | POA: Diagnosis not present

## 2014-04-06 DIAGNOSIS — E119 Type 2 diabetes mellitus without complications: Secondary | ICD-10-CM | POA: Diagnosis not present

## 2014-04-06 DIAGNOSIS — D509 Iron deficiency anemia, unspecified: Secondary | ICD-10-CM | POA: Diagnosis not present

## 2014-04-08 DIAGNOSIS — N186 End stage renal disease: Secondary | ICD-10-CM | POA: Diagnosis not present

## 2014-04-08 DIAGNOSIS — E119 Type 2 diabetes mellitus without complications: Secondary | ICD-10-CM | POA: Diagnosis not present

## 2014-04-08 DIAGNOSIS — N2581 Secondary hyperparathyroidism of renal origin: Secondary | ICD-10-CM | POA: Diagnosis not present

## 2014-04-08 DIAGNOSIS — D631 Anemia in chronic kidney disease: Secondary | ICD-10-CM | POA: Diagnosis not present

## 2014-04-08 DIAGNOSIS — D509 Iron deficiency anemia, unspecified: Secondary | ICD-10-CM | POA: Diagnosis not present

## 2014-04-08 DIAGNOSIS — Z992 Dependence on renal dialysis: Secondary | ICD-10-CM | POA: Diagnosis not present

## 2014-04-10 DIAGNOSIS — N186 End stage renal disease: Secondary | ICD-10-CM | POA: Diagnosis not present

## 2014-04-10 DIAGNOSIS — D631 Anemia in chronic kidney disease: Secondary | ICD-10-CM | POA: Diagnosis not present

## 2014-04-10 DIAGNOSIS — D509 Iron deficiency anemia, unspecified: Secondary | ICD-10-CM | POA: Diagnosis not present

## 2014-04-10 DIAGNOSIS — N2581 Secondary hyperparathyroidism of renal origin: Secondary | ICD-10-CM | POA: Diagnosis not present

## 2014-04-13 DIAGNOSIS — N2581 Secondary hyperparathyroidism of renal origin: Secondary | ICD-10-CM | POA: Diagnosis not present

## 2014-04-13 DIAGNOSIS — N186 End stage renal disease: Secondary | ICD-10-CM | POA: Diagnosis not present

## 2014-04-13 DIAGNOSIS — D509 Iron deficiency anemia, unspecified: Secondary | ICD-10-CM | POA: Diagnosis not present

## 2014-04-13 DIAGNOSIS — D631 Anemia in chronic kidney disease: Secondary | ICD-10-CM | POA: Diagnosis not present

## 2014-04-15 DIAGNOSIS — N2581 Secondary hyperparathyroidism of renal origin: Secondary | ICD-10-CM | POA: Diagnosis not present

## 2014-04-15 DIAGNOSIS — D509 Iron deficiency anemia, unspecified: Secondary | ICD-10-CM | POA: Diagnosis not present

## 2014-04-15 DIAGNOSIS — N186 End stage renal disease: Secondary | ICD-10-CM | POA: Diagnosis not present

## 2014-04-15 DIAGNOSIS — D631 Anemia in chronic kidney disease: Secondary | ICD-10-CM | POA: Diagnosis not present

## 2014-04-17 DIAGNOSIS — N2581 Secondary hyperparathyroidism of renal origin: Secondary | ICD-10-CM | POA: Diagnosis not present

## 2014-04-17 DIAGNOSIS — N186 End stage renal disease: Secondary | ICD-10-CM | POA: Diagnosis not present

## 2014-04-17 DIAGNOSIS — D509 Iron deficiency anemia, unspecified: Secondary | ICD-10-CM | POA: Diagnosis not present

## 2014-04-17 DIAGNOSIS — D631 Anemia in chronic kidney disease: Secondary | ICD-10-CM | POA: Diagnosis not present

## 2014-04-20 DIAGNOSIS — N2581 Secondary hyperparathyroidism of renal origin: Secondary | ICD-10-CM | POA: Diagnosis not present

## 2014-04-20 DIAGNOSIS — D631 Anemia in chronic kidney disease: Secondary | ICD-10-CM | POA: Diagnosis not present

## 2014-04-20 DIAGNOSIS — N186 End stage renal disease: Secondary | ICD-10-CM | POA: Diagnosis not present

## 2014-04-20 DIAGNOSIS — D509 Iron deficiency anemia, unspecified: Secondary | ICD-10-CM | POA: Diagnosis not present

## 2014-04-22 DIAGNOSIS — D631 Anemia in chronic kidney disease: Secondary | ICD-10-CM | POA: Diagnosis not present

## 2014-04-22 DIAGNOSIS — N186 End stage renal disease: Secondary | ICD-10-CM | POA: Diagnosis not present

## 2014-04-22 DIAGNOSIS — D509 Iron deficiency anemia, unspecified: Secondary | ICD-10-CM | POA: Diagnosis not present

## 2014-04-22 DIAGNOSIS — N2581 Secondary hyperparathyroidism of renal origin: Secondary | ICD-10-CM | POA: Diagnosis not present

## 2014-04-24 DIAGNOSIS — D631 Anemia in chronic kidney disease: Secondary | ICD-10-CM | POA: Diagnosis not present

## 2014-04-24 DIAGNOSIS — N186 End stage renal disease: Secondary | ICD-10-CM | POA: Diagnosis not present

## 2014-04-24 DIAGNOSIS — N2581 Secondary hyperparathyroidism of renal origin: Secondary | ICD-10-CM | POA: Diagnosis not present

## 2014-04-24 DIAGNOSIS — D509 Iron deficiency anemia, unspecified: Secondary | ICD-10-CM | POA: Diagnosis not present

## 2014-04-27 DIAGNOSIS — N186 End stage renal disease: Secondary | ICD-10-CM | POA: Diagnosis not present

## 2014-04-27 DIAGNOSIS — D509 Iron deficiency anemia, unspecified: Secondary | ICD-10-CM | POA: Diagnosis not present

## 2014-04-27 DIAGNOSIS — D631 Anemia in chronic kidney disease: Secondary | ICD-10-CM | POA: Diagnosis not present

## 2014-04-27 DIAGNOSIS — N2581 Secondary hyperparathyroidism of renal origin: Secondary | ICD-10-CM | POA: Diagnosis not present

## 2014-04-28 ENCOUNTER — Encounter: Payer: Medicare Other | Admitting: Cardiovascular Disease

## 2014-04-28 NOTE — Progress Notes (Signed)
No show

## 2014-04-29 DIAGNOSIS — N2581 Secondary hyperparathyroidism of renal origin: Secondary | ICD-10-CM | POA: Diagnosis not present

## 2014-04-29 DIAGNOSIS — D509 Iron deficiency anemia, unspecified: Secondary | ICD-10-CM | POA: Diagnosis not present

## 2014-04-29 DIAGNOSIS — D631 Anemia in chronic kidney disease: Secondary | ICD-10-CM | POA: Diagnosis not present

## 2014-04-29 DIAGNOSIS — N186 End stage renal disease: Secondary | ICD-10-CM | POA: Diagnosis not present

## 2014-05-01 DIAGNOSIS — D509 Iron deficiency anemia, unspecified: Secondary | ICD-10-CM | POA: Diagnosis not present

## 2014-05-01 DIAGNOSIS — N2581 Secondary hyperparathyroidism of renal origin: Secondary | ICD-10-CM | POA: Diagnosis not present

## 2014-05-01 DIAGNOSIS — D631 Anemia in chronic kidney disease: Secondary | ICD-10-CM | POA: Diagnosis not present

## 2014-05-01 DIAGNOSIS — N186 End stage renal disease: Secondary | ICD-10-CM | POA: Diagnosis not present

## 2014-05-04 DIAGNOSIS — N2581 Secondary hyperparathyroidism of renal origin: Secondary | ICD-10-CM | POA: Diagnosis not present

## 2014-05-04 DIAGNOSIS — D509 Iron deficiency anemia, unspecified: Secondary | ICD-10-CM | POA: Diagnosis not present

## 2014-05-04 DIAGNOSIS — N186 End stage renal disease: Secondary | ICD-10-CM | POA: Diagnosis not present

## 2014-05-04 DIAGNOSIS — D631 Anemia in chronic kidney disease: Secondary | ICD-10-CM | POA: Diagnosis not present

## 2014-05-06 DIAGNOSIS — N186 End stage renal disease: Secondary | ICD-10-CM | POA: Diagnosis not present

## 2014-05-06 DIAGNOSIS — E119 Type 2 diabetes mellitus without complications: Secondary | ICD-10-CM | POA: Diagnosis not present

## 2014-05-06 DIAGNOSIS — D631 Anemia in chronic kidney disease: Secondary | ICD-10-CM | POA: Diagnosis not present

## 2014-05-06 DIAGNOSIS — N2581 Secondary hyperparathyroidism of renal origin: Secondary | ICD-10-CM | POA: Diagnosis not present

## 2014-05-06 DIAGNOSIS — D509 Iron deficiency anemia, unspecified: Secondary | ICD-10-CM | POA: Diagnosis not present

## 2014-05-07 DIAGNOSIS — N186 End stage renal disease: Secondary | ICD-10-CM | POA: Diagnosis not present

## 2014-05-07 DIAGNOSIS — E8770 Fluid overload, unspecified: Secondary | ICD-10-CM | POA: Diagnosis not present

## 2014-05-08 DIAGNOSIS — N186 End stage renal disease: Secondary | ICD-10-CM | POA: Diagnosis not present

## 2014-05-08 DIAGNOSIS — D509 Iron deficiency anemia, unspecified: Secondary | ICD-10-CM | POA: Diagnosis not present

## 2014-05-08 DIAGNOSIS — T8612 Kidney transplant failure: Secondary | ICD-10-CM | POA: Diagnosis not present

## 2014-05-08 DIAGNOSIS — D631 Anemia in chronic kidney disease: Secondary | ICD-10-CM | POA: Diagnosis not present

## 2014-05-08 DIAGNOSIS — N2581 Secondary hyperparathyroidism of renal origin: Secondary | ICD-10-CM | POA: Diagnosis not present

## 2014-05-08 DIAGNOSIS — Z992 Dependence on renal dialysis: Secondary | ICD-10-CM | POA: Diagnosis not present

## 2014-05-11 DIAGNOSIS — N2581 Secondary hyperparathyroidism of renal origin: Secondary | ICD-10-CM | POA: Diagnosis not present

## 2014-05-11 DIAGNOSIS — E119 Type 2 diabetes mellitus without complications: Secondary | ICD-10-CM | POA: Diagnosis not present

## 2014-05-11 DIAGNOSIS — D509 Iron deficiency anemia, unspecified: Secondary | ICD-10-CM | POA: Diagnosis not present

## 2014-05-11 DIAGNOSIS — N186 End stage renal disease: Secondary | ICD-10-CM | POA: Diagnosis not present

## 2014-05-11 DIAGNOSIS — D631 Anemia in chronic kidney disease: Secondary | ICD-10-CM | POA: Diagnosis not present

## 2014-05-13 DIAGNOSIS — E119 Type 2 diabetes mellitus without complications: Secondary | ICD-10-CM | POA: Diagnosis not present

## 2014-05-13 DIAGNOSIS — D509 Iron deficiency anemia, unspecified: Secondary | ICD-10-CM | POA: Diagnosis not present

## 2014-05-13 DIAGNOSIS — D631 Anemia in chronic kidney disease: Secondary | ICD-10-CM | POA: Diagnosis not present

## 2014-05-13 DIAGNOSIS — N2581 Secondary hyperparathyroidism of renal origin: Secondary | ICD-10-CM | POA: Diagnosis not present

## 2014-05-13 DIAGNOSIS — N186 End stage renal disease: Secondary | ICD-10-CM | POA: Diagnosis not present

## 2014-05-14 DIAGNOSIS — D509 Iron deficiency anemia, unspecified: Secondary | ICD-10-CM | POA: Diagnosis not present

## 2014-05-14 DIAGNOSIS — D631 Anemia in chronic kidney disease: Secondary | ICD-10-CM | POA: Diagnosis not present

## 2014-05-14 DIAGNOSIS — N186 End stage renal disease: Secondary | ICD-10-CM | POA: Diagnosis not present

## 2014-05-14 DIAGNOSIS — E119 Type 2 diabetes mellitus without complications: Secondary | ICD-10-CM | POA: Diagnosis not present

## 2014-05-14 DIAGNOSIS — N2581 Secondary hyperparathyroidism of renal origin: Secondary | ICD-10-CM | POA: Diagnosis not present

## 2014-05-18 DIAGNOSIS — D631 Anemia in chronic kidney disease: Secondary | ICD-10-CM | POA: Diagnosis not present

## 2014-05-18 DIAGNOSIS — D509 Iron deficiency anemia, unspecified: Secondary | ICD-10-CM | POA: Diagnosis not present

## 2014-05-18 DIAGNOSIS — N2581 Secondary hyperparathyroidism of renal origin: Secondary | ICD-10-CM | POA: Diagnosis not present

## 2014-05-18 DIAGNOSIS — N186 End stage renal disease: Secondary | ICD-10-CM | POA: Diagnosis not present

## 2014-05-18 DIAGNOSIS — E119 Type 2 diabetes mellitus without complications: Secondary | ICD-10-CM | POA: Diagnosis not present

## 2014-05-20 DIAGNOSIS — D509 Iron deficiency anemia, unspecified: Secondary | ICD-10-CM | POA: Diagnosis not present

## 2014-05-20 DIAGNOSIS — N2581 Secondary hyperparathyroidism of renal origin: Secondary | ICD-10-CM | POA: Diagnosis not present

## 2014-05-20 DIAGNOSIS — E119 Type 2 diabetes mellitus without complications: Secondary | ICD-10-CM | POA: Diagnosis not present

## 2014-05-20 DIAGNOSIS — D631 Anemia in chronic kidney disease: Secondary | ICD-10-CM | POA: Diagnosis not present

## 2014-05-20 DIAGNOSIS — N186 End stage renal disease: Secondary | ICD-10-CM | POA: Diagnosis not present

## 2014-05-22 DIAGNOSIS — N186 End stage renal disease: Secondary | ICD-10-CM | POA: Diagnosis not present

## 2014-05-22 DIAGNOSIS — E119 Type 2 diabetes mellitus without complications: Secondary | ICD-10-CM | POA: Diagnosis not present

## 2014-05-22 DIAGNOSIS — N2581 Secondary hyperparathyroidism of renal origin: Secondary | ICD-10-CM | POA: Diagnosis not present

## 2014-05-22 DIAGNOSIS — D509 Iron deficiency anemia, unspecified: Secondary | ICD-10-CM | POA: Diagnosis not present

## 2014-05-22 DIAGNOSIS — D631 Anemia in chronic kidney disease: Secondary | ICD-10-CM | POA: Diagnosis not present

## 2014-05-25 DIAGNOSIS — D509 Iron deficiency anemia, unspecified: Secondary | ICD-10-CM | POA: Diagnosis not present

## 2014-05-25 DIAGNOSIS — E119 Type 2 diabetes mellitus without complications: Secondary | ICD-10-CM | POA: Diagnosis not present

## 2014-05-25 DIAGNOSIS — N186 End stage renal disease: Secondary | ICD-10-CM | POA: Diagnosis not present

## 2014-05-25 DIAGNOSIS — D631 Anemia in chronic kidney disease: Secondary | ICD-10-CM | POA: Diagnosis not present

## 2014-05-25 DIAGNOSIS — N2581 Secondary hyperparathyroidism of renal origin: Secondary | ICD-10-CM | POA: Diagnosis not present

## 2014-05-27 DIAGNOSIS — D509 Iron deficiency anemia, unspecified: Secondary | ICD-10-CM | POA: Diagnosis not present

## 2014-05-27 DIAGNOSIS — D631 Anemia in chronic kidney disease: Secondary | ICD-10-CM | POA: Diagnosis not present

## 2014-05-27 DIAGNOSIS — N186 End stage renal disease: Secondary | ICD-10-CM | POA: Diagnosis not present

## 2014-05-27 DIAGNOSIS — E119 Type 2 diabetes mellitus without complications: Secondary | ICD-10-CM | POA: Diagnosis not present

## 2014-05-27 DIAGNOSIS — N2581 Secondary hyperparathyroidism of renal origin: Secondary | ICD-10-CM | POA: Diagnosis not present

## 2014-05-28 ENCOUNTER — Encounter: Payer: Self-pay | Admitting: Cardiovascular Disease

## 2014-05-29 DIAGNOSIS — E119 Type 2 diabetes mellitus without complications: Secondary | ICD-10-CM | POA: Diagnosis not present

## 2014-05-29 DIAGNOSIS — N2581 Secondary hyperparathyroidism of renal origin: Secondary | ICD-10-CM | POA: Diagnosis not present

## 2014-05-29 DIAGNOSIS — D509 Iron deficiency anemia, unspecified: Secondary | ICD-10-CM | POA: Diagnosis not present

## 2014-05-29 DIAGNOSIS — N186 End stage renal disease: Secondary | ICD-10-CM | POA: Diagnosis not present

## 2014-05-29 DIAGNOSIS — D631 Anemia in chronic kidney disease: Secondary | ICD-10-CM | POA: Diagnosis not present

## 2014-06-01 DIAGNOSIS — D631 Anemia in chronic kidney disease: Secondary | ICD-10-CM | POA: Diagnosis not present

## 2014-06-01 DIAGNOSIS — N186 End stage renal disease: Secondary | ICD-10-CM | POA: Diagnosis not present

## 2014-06-01 DIAGNOSIS — E119 Type 2 diabetes mellitus without complications: Secondary | ICD-10-CM | POA: Diagnosis not present

## 2014-06-01 DIAGNOSIS — D509 Iron deficiency anemia, unspecified: Secondary | ICD-10-CM | POA: Diagnosis not present

## 2014-06-01 DIAGNOSIS — N2581 Secondary hyperparathyroidism of renal origin: Secondary | ICD-10-CM | POA: Diagnosis not present

## 2014-06-03 DIAGNOSIS — N2581 Secondary hyperparathyroidism of renal origin: Secondary | ICD-10-CM | POA: Diagnosis not present

## 2014-06-03 DIAGNOSIS — D509 Iron deficiency anemia, unspecified: Secondary | ICD-10-CM | POA: Diagnosis not present

## 2014-06-03 DIAGNOSIS — E119 Type 2 diabetes mellitus without complications: Secondary | ICD-10-CM | POA: Diagnosis not present

## 2014-06-03 DIAGNOSIS — N186 End stage renal disease: Secondary | ICD-10-CM | POA: Diagnosis not present

## 2014-06-03 DIAGNOSIS — D631 Anemia in chronic kidney disease: Secondary | ICD-10-CM | POA: Diagnosis not present

## 2014-06-05 DIAGNOSIS — N2581 Secondary hyperparathyroidism of renal origin: Secondary | ICD-10-CM | POA: Diagnosis not present

## 2014-06-05 DIAGNOSIS — D631 Anemia in chronic kidney disease: Secondary | ICD-10-CM | POA: Diagnosis not present

## 2014-06-05 DIAGNOSIS — N186 End stage renal disease: Secondary | ICD-10-CM | POA: Diagnosis not present

## 2014-06-05 DIAGNOSIS — D509 Iron deficiency anemia, unspecified: Secondary | ICD-10-CM | POA: Diagnosis not present

## 2014-06-05 DIAGNOSIS — E119 Type 2 diabetes mellitus without complications: Secondary | ICD-10-CM | POA: Diagnosis not present

## 2014-06-08 DIAGNOSIS — N2581 Secondary hyperparathyroidism of renal origin: Secondary | ICD-10-CM | POA: Diagnosis not present

## 2014-06-08 DIAGNOSIS — N186 End stage renal disease: Secondary | ICD-10-CM | POA: Diagnosis not present

## 2014-06-08 DIAGNOSIS — T8612 Kidney transplant failure: Secondary | ICD-10-CM | POA: Diagnosis not present

## 2014-06-08 DIAGNOSIS — D509 Iron deficiency anemia, unspecified: Secondary | ICD-10-CM | POA: Diagnosis not present

## 2014-06-08 DIAGNOSIS — Z992 Dependence on renal dialysis: Secondary | ICD-10-CM | POA: Diagnosis not present

## 2014-06-08 DIAGNOSIS — D631 Anemia in chronic kidney disease: Secondary | ICD-10-CM | POA: Diagnosis not present

## 2014-06-08 DIAGNOSIS — E119 Type 2 diabetes mellitus without complications: Secondary | ICD-10-CM | POA: Diagnosis not present

## 2014-06-10 DIAGNOSIS — D509 Iron deficiency anemia, unspecified: Secondary | ICD-10-CM | POA: Diagnosis not present

## 2014-06-10 DIAGNOSIS — N2581 Secondary hyperparathyroidism of renal origin: Secondary | ICD-10-CM | POA: Diagnosis not present

## 2014-06-10 DIAGNOSIS — N186 End stage renal disease: Secondary | ICD-10-CM | POA: Diagnosis not present

## 2014-06-10 DIAGNOSIS — D631 Anemia in chronic kidney disease: Secondary | ICD-10-CM | POA: Diagnosis not present

## 2014-06-10 DIAGNOSIS — E119 Type 2 diabetes mellitus without complications: Secondary | ICD-10-CM | POA: Diagnosis not present

## 2014-06-12 DIAGNOSIS — E119 Type 2 diabetes mellitus without complications: Secondary | ICD-10-CM | POA: Diagnosis not present

## 2014-06-12 DIAGNOSIS — N2581 Secondary hyperparathyroidism of renal origin: Secondary | ICD-10-CM | POA: Diagnosis not present

## 2014-06-12 DIAGNOSIS — D631 Anemia in chronic kidney disease: Secondary | ICD-10-CM | POA: Diagnosis not present

## 2014-06-12 DIAGNOSIS — D509 Iron deficiency anemia, unspecified: Secondary | ICD-10-CM | POA: Diagnosis not present

## 2014-06-12 DIAGNOSIS — N186 End stage renal disease: Secondary | ICD-10-CM | POA: Diagnosis not present

## 2014-06-15 DIAGNOSIS — D631 Anemia in chronic kidney disease: Secondary | ICD-10-CM | POA: Diagnosis not present

## 2014-06-15 DIAGNOSIS — E119 Type 2 diabetes mellitus without complications: Secondary | ICD-10-CM | POA: Diagnosis not present

## 2014-06-15 DIAGNOSIS — N186 End stage renal disease: Secondary | ICD-10-CM | POA: Diagnosis not present

## 2014-06-15 DIAGNOSIS — N2581 Secondary hyperparathyroidism of renal origin: Secondary | ICD-10-CM | POA: Diagnosis not present

## 2014-06-15 DIAGNOSIS — D509 Iron deficiency anemia, unspecified: Secondary | ICD-10-CM | POA: Diagnosis not present

## 2014-06-17 DIAGNOSIS — D631 Anemia in chronic kidney disease: Secondary | ICD-10-CM | POA: Diagnosis not present

## 2014-06-17 DIAGNOSIS — N2581 Secondary hyperparathyroidism of renal origin: Secondary | ICD-10-CM | POA: Diagnosis not present

## 2014-06-17 DIAGNOSIS — E119 Type 2 diabetes mellitus without complications: Secondary | ICD-10-CM | POA: Diagnosis not present

## 2014-06-17 DIAGNOSIS — N186 End stage renal disease: Secondary | ICD-10-CM | POA: Diagnosis not present

## 2014-06-17 DIAGNOSIS — D509 Iron deficiency anemia, unspecified: Secondary | ICD-10-CM | POA: Diagnosis not present

## 2014-06-19 DIAGNOSIS — E119 Type 2 diabetes mellitus without complications: Secondary | ICD-10-CM | POA: Diagnosis not present

## 2014-06-19 DIAGNOSIS — D631 Anemia in chronic kidney disease: Secondary | ICD-10-CM | POA: Diagnosis not present

## 2014-06-19 DIAGNOSIS — N2581 Secondary hyperparathyroidism of renal origin: Secondary | ICD-10-CM | POA: Diagnosis not present

## 2014-06-19 DIAGNOSIS — N186 End stage renal disease: Secondary | ICD-10-CM | POA: Diagnosis not present

## 2014-06-19 DIAGNOSIS — D509 Iron deficiency anemia, unspecified: Secondary | ICD-10-CM | POA: Diagnosis not present

## 2014-06-22 DIAGNOSIS — D509 Iron deficiency anemia, unspecified: Secondary | ICD-10-CM | POA: Diagnosis not present

## 2014-06-22 DIAGNOSIS — N2581 Secondary hyperparathyroidism of renal origin: Secondary | ICD-10-CM | POA: Diagnosis not present

## 2014-06-22 DIAGNOSIS — N186 End stage renal disease: Secondary | ICD-10-CM | POA: Diagnosis not present

## 2014-06-22 DIAGNOSIS — E119 Type 2 diabetes mellitus without complications: Secondary | ICD-10-CM | POA: Diagnosis not present

## 2014-06-22 DIAGNOSIS — D631 Anemia in chronic kidney disease: Secondary | ICD-10-CM | POA: Diagnosis not present

## 2014-06-24 DIAGNOSIS — D631 Anemia in chronic kidney disease: Secondary | ICD-10-CM | POA: Diagnosis not present

## 2014-06-24 DIAGNOSIS — E119 Type 2 diabetes mellitus without complications: Secondary | ICD-10-CM | POA: Diagnosis not present

## 2014-06-24 DIAGNOSIS — D509 Iron deficiency anemia, unspecified: Secondary | ICD-10-CM | POA: Diagnosis not present

## 2014-06-24 DIAGNOSIS — N186 End stage renal disease: Secondary | ICD-10-CM | POA: Diagnosis not present

## 2014-06-24 DIAGNOSIS — N2581 Secondary hyperparathyroidism of renal origin: Secondary | ICD-10-CM | POA: Diagnosis not present

## 2014-06-26 DIAGNOSIS — N2581 Secondary hyperparathyroidism of renal origin: Secondary | ICD-10-CM | POA: Diagnosis not present

## 2014-06-26 DIAGNOSIS — D509 Iron deficiency anemia, unspecified: Secondary | ICD-10-CM | POA: Diagnosis not present

## 2014-06-26 DIAGNOSIS — N186 End stage renal disease: Secondary | ICD-10-CM | POA: Diagnosis not present

## 2014-06-26 DIAGNOSIS — E119 Type 2 diabetes mellitus without complications: Secondary | ICD-10-CM | POA: Diagnosis not present

## 2014-06-26 DIAGNOSIS — D631 Anemia in chronic kidney disease: Secondary | ICD-10-CM | POA: Diagnosis not present

## 2014-06-29 DIAGNOSIS — N2581 Secondary hyperparathyroidism of renal origin: Secondary | ICD-10-CM | POA: Diagnosis not present

## 2014-06-29 DIAGNOSIS — D631 Anemia in chronic kidney disease: Secondary | ICD-10-CM | POA: Diagnosis not present

## 2014-06-29 DIAGNOSIS — E119 Type 2 diabetes mellitus without complications: Secondary | ICD-10-CM | POA: Diagnosis not present

## 2014-06-29 DIAGNOSIS — N186 End stage renal disease: Secondary | ICD-10-CM | POA: Diagnosis not present

## 2014-06-29 DIAGNOSIS — D509 Iron deficiency anemia, unspecified: Secondary | ICD-10-CM | POA: Diagnosis not present

## 2014-07-01 DIAGNOSIS — N2581 Secondary hyperparathyroidism of renal origin: Secondary | ICD-10-CM | POA: Diagnosis not present

## 2014-07-01 DIAGNOSIS — D509 Iron deficiency anemia, unspecified: Secondary | ICD-10-CM | POA: Diagnosis not present

## 2014-07-01 DIAGNOSIS — N186 End stage renal disease: Secondary | ICD-10-CM | POA: Diagnosis not present

## 2014-07-01 DIAGNOSIS — E119 Type 2 diabetes mellitus without complications: Secondary | ICD-10-CM | POA: Diagnosis not present

## 2014-07-01 DIAGNOSIS — D631 Anemia in chronic kidney disease: Secondary | ICD-10-CM | POA: Diagnosis not present

## 2014-07-03 DIAGNOSIS — D509 Iron deficiency anemia, unspecified: Secondary | ICD-10-CM | POA: Diagnosis not present

## 2014-07-03 DIAGNOSIS — N186 End stage renal disease: Secondary | ICD-10-CM | POA: Diagnosis not present

## 2014-07-03 DIAGNOSIS — N2581 Secondary hyperparathyroidism of renal origin: Secondary | ICD-10-CM | POA: Diagnosis not present

## 2014-07-03 DIAGNOSIS — D631 Anemia in chronic kidney disease: Secondary | ICD-10-CM | POA: Diagnosis not present

## 2014-07-03 DIAGNOSIS — E119 Type 2 diabetes mellitus without complications: Secondary | ICD-10-CM | POA: Diagnosis not present

## 2014-07-06 DIAGNOSIS — N186 End stage renal disease: Secondary | ICD-10-CM | POA: Diagnosis not present

## 2014-07-06 DIAGNOSIS — D631 Anemia in chronic kidney disease: Secondary | ICD-10-CM | POA: Diagnosis not present

## 2014-07-06 DIAGNOSIS — D509 Iron deficiency anemia, unspecified: Secondary | ICD-10-CM | POA: Diagnosis not present

## 2014-07-06 DIAGNOSIS — E119 Type 2 diabetes mellitus without complications: Secondary | ICD-10-CM | POA: Diagnosis not present

## 2014-07-06 DIAGNOSIS — N2581 Secondary hyperparathyroidism of renal origin: Secondary | ICD-10-CM | POA: Diagnosis not present

## 2014-07-08 DIAGNOSIS — Z992 Dependence on renal dialysis: Secondary | ICD-10-CM | POA: Diagnosis not present

## 2014-07-08 DIAGNOSIS — E119 Type 2 diabetes mellitus without complications: Secondary | ICD-10-CM | POA: Diagnosis not present

## 2014-07-08 DIAGNOSIS — T8612 Kidney transplant failure: Secondary | ICD-10-CM | POA: Diagnosis not present

## 2014-07-08 DIAGNOSIS — D509 Iron deficiency anemia, unspecified: Secondary | ICD-10-CM | POA: Diagnosis not present

## 2014-07-08 DIAGNOSIS — D631 Anemia in chronic kidney disease: Secondary | ICD-10-CM | POA: Diagnosis not present

## 2014-07-08 DIAGNOSIS — N186 End stage renal disease: Secondary | ICD-10-CM | POA: Diagnosis not present

## 2014-07-08 DIAGNOSIS — N2581 Secondary hyperparathyroidism of renal origin: Secondary | ICD-10-CM | POA: Diagnosis not present

## 2014-07-10 DIAGNOSIS — N186 End stage renal disease: Secondary | ICD-10-CM | POA: Diagnosis not present

## 2014-07-10 DIAGNOSIS — D631 Anemia in chronic kidney disease: Secondary | ICD-10-CM | POA: Diagnosis not present

## 2014-07-10 DIAGNOSIS — N2581 Secondary hyperparathyroidism of renal origin: Secondary | ICD-10-CM | POA: Diagnosis not present

## 2014-07-10 DIAGNOSIS — E119 Type 2 diabetes mellitus without complications: Secondary | ICD-10-CM | POA: Diagnosis not present

## 2014-07-13 DIAGNOSIS — N186 End stage renal disease: Secondary | ICD-10-CM | POA: Diagnosis not present

## 2014-07-13 DIAGNOSIS — E119 Type 2 diabetes mellitus without complications: Secondary | ICD-10-CM | POA: Diagnosis not present

## 2014-07-13 DIAGNOSIS — N2581 Secondary hyperparathyroidism of renal origin: Secondary | ICD-10-CM | POA: Diagnosis not present

## 2014-07-13 DIAGNOSIS — D631 Anemia in chronic kidney disease: Secondary | ICD-10-CM | POA: Diagnosis not present

## 2014-07-15 DIAGNOSIS — N186 End stage renal disease: Secondary | ICD-10-CM | POA: Diagnosis not present

## 2014-07-15 DIAGNOSIS — E119 Type 2 diabetes mellitus without complications: Secondary | ICD-10-CM | POA: Diagnosis not present

## 2014-07-15 DIAGNOSIS — N2581 Secondary hyperparathyroidism of renal origin: Secondary | ICD-10-CM | POA: Diagnosis not present

## 2014-07-15 DIAGNOSIS — D631 Anemia in chronic kidney disease: Secondary | ICD-10-CM | POA: Diagnosis not present

## 2014-07-17 DIAGNOSIS — N2581 Secondary hyperparathyroidism of renal origin: Secondary | ICD-10-CM | POA: Diagnosis not present

## 2014-07-17 DIAGNOSIS — E119 Type 2 diabetes mellitus without complications: Secondary | ICD-10-CM | POA: Diagnosis not present

## 2014-07-17 DIAGNOSIS — D631 Anemia in chronic kidney disease: Secondary | ICD-10-CM | POA: Diagnosis not present

## 2014-07-17 DIAGNOSIS — N186 End stage renal disease: Secondary | ICD-10-CM | POA: Diagnosis not present

## 2014-07-20 DIAGNOSIS — N2581 Secondary hyperparathyroidism of renal origin: Secondary | ICD-10-CM | POA: Diagnosis not present

## 2014-07-20 DIAGNOSIS — E119 Type 2 diabetes mellitus without complications: Secondary | ICD-10-CM | POA: Diagnosis not present

## 2014-07-20 DIAGNOSIS — N186 End stage renal disease: Secondary | ICD-10-CM | POA: Diagnosis not present

## 2014-07-20 DIAGNOSIS — D631 Anemia in chronic kidney disease: Secondary | ICD-10-CM | POA: Diagnosis not present

## 2014-07-22 DIAGNOSIS — E119 Type 2 diabetes mellitus without complications: Secondary | ICD-10-CM | POA: Diagnosis not present

## 2014-07-22 DIAGNOSIS — N186 End stage renal disease: Secondary | ICD-10-CM | POA: Diagnosis not present

## 2014-07-22 DIAGNOSIS — N2581 Secondary hyperparathyroidism of renal origin: Secondary | ICD-10-CM | POA: Diagnosis not present

## 2014-07-22 DIAGNOSIS — D631 Anemia in chronic kidney disease: Secondary | ICD-10-CM | POA: Diagnosis not present

## 2014-07-24 DIAGNOSIS — N2581 Secondary hyperparathyroidism of renal origin: Secondary | ICD-10-CM | POA: Diagnosis not present

## 2014-07-24 DIAGNOSIS — N186 End stage renal disease: Secondary | ICD-10-CM | POA: Diagnosis not present

## 2014-07-24 DIAGNOSIS — E119 Type 2 diabetes mellitus without complications: Secondary | ICD-10-CM | POA: Diagnosis not present

## 2014-07-24 DIAGNOSIS — D631 Anemia in chronic kidney disease: Secondary | ICD-10-CM | POA: Diagnosis not present

## 2014-07-27 DIAGNOSIS — D631 Anemia in chronic kidney disease: Secondary | ICD-10-CM | POA: Diagnosis not present

## 2014-07-27 DIAGNOSIS — E119 Type 2 diabetes mellitus without complications: Secondary | ICD-10-CM | POA: Diagnosis not present

## 2014-07-27 DIAGNOSIS — N186 End stage renal disease: Secondary | ICD-10-CM | POA: Diagnosis not present

## 2014-07-27 DIAGNOSIS — N2581 Secondary hyperparathyroidism of renal origin: Secondary | ICD-10-CM | POA: Diagnosis not present

## 2014-07-29 DIAGNOSIS — N2581 Secondary hyperparathyroidism of renal origin: Secondary | ICD-10-CM | POA: Diagnosis not present

## 2014-07-29 DIAGNOSIS — D631 Anemia in chronic kidney disease: Secondary | ICD-10-CM | POA: Diagnosis not present

## 2014-07-29 DIAGNOSIS — E119 Type 2 diabetes mellitus without complications: Secondary | ICD-10-CM | POA: Diagnosis not present

## 2014-07-29 DIAGNOSIS — N186 End stage renal disease: Secondary | ICD-10-CM | POA: Diagnosis not present

## 2014-07-31 DIAGNOSIS — D631 Anemia in chronic kidney disease: Secondary | ICD-10-CM | POA: Diagnosis not present

## 2014-07-31 DIAGNOSIS — N2581 Secondary hyperparathyroidism of renal origin: Secondary | ICD-10-CM | POA: Diagnosis not present

## 2014-07-31 DIAGNOSIS — E119 Type 2 diabetes mellitus without complications: Secondary | ICD-10-CM | POA: Diagnosis not present

## 2014-07-31 DIAGNOSIS — N186 End stage renal disease: Secondary | ICD-10-CM | POA: Diagnosis not present

## 2014-08-03 DIAGNOSIS — D631 Anemia in chronic kidney disease: Secondary | ICD-10-CM | POA: Diagnosis not present

## 2014-08-03 DIAGNOSIS — N2581 Secondary hyperparathyroidism of renal origin: Secondary | ICD-10-CM | POA: Diagnosis not present

## 2014-08-03 DIAGNOSIS — N186 End stage renal disease: Secondary | ICD-10-CM | POA: Diagnosis not present

## 2014-08-03 DIAGNOSIS — E119 Type 2 diabetes mellitus without complications: Secondary | ICD-10-CM | POA: Diagnosis not present

## 2014-08-05 DIAGNOSIS — N2581 Secondary hyperparathyroidism of renal origin: Secondary | ICD-10-CM | POA: Diagnosis not present

## 2014-08-05 DIAGNOSIS — D631 Anemia in chronic kidney disease: Secondary | ICD-10-CM | POA: Diagnosis not present

## 2014-08-05 DIAGNOSIS — N186 End stage renal disease: Secondary | ICD-10-CM | POA: Diagnosis not present

## 2014-08-05 DIAGNOSIS — E119 Type 2 diabetes mellitus without complications: Secondary | ICD-10-CM | POA: Diagnosis not present

## 2014-08-07 DIAGNOSIS — N186 End stage renal disease: Secondary | ICD-10-CM | POA: Diagnosis not present

## 2014-08-07 DIAGNOSIS — E119 Type 2 diabetes mellitus without complications: Secondary | ICD-10-CM | POA: Diagnosis not present

## 2014-08-07 DIAGNOSIS — N2581 Secondary hyperparathyroidism of renal origin: Secondary | ICD-10-CM | POA: Diagnosis not present

## 2014-08-07 DIAGNOSIS — D631 Anemia in chronic kidney disease: Secondary | ICD-10-CM | POA: Diagnosis not present

## 2014-08-08 DIAGNOSIS — Z992 Dependence on renal dialysis: Secondary | ICD-10-CM | POA: Diagnosis not present

## 2014-08-08 DIAGNOSIS — T8612 Kidney transplant failure: Secondary | ICD-10-CM | POA: Diagnosis not present

## 2014-08-08 DIAGNOSIS — N186 End stage renal disease: Secondary | ICD-10-CM | POA: Diagnosis not present

## 2014-08-10 DIAGNOSIS — N186 End stage renal disease: Secondary | ICD-10-CM | POA: Diagnosis not present

## 2014-08-10 DIAGNOSIS — N2581 Secondary hyperparathyroidism of renal origin: Secondary | ICD-10-CM | POA: Diagnosis not present

## 2014-08-10 DIAGNOSIS — D509 Iron deficiency anemia, unspecified: Secondary | ICD-10-CM | POA: Diagnosis not present

## 2014-08-10 DIAGNOSIS — E119 Type 2 diabetes mellitus without complications: Secondary | ICD-10-CM | POA: Diagnosis not present

## 2014-08-10 DIAGNOSIS — D631 Anemia in chronic kidney disease: Secondary | ICD-10-CM | POA: Diagnosis not present

## 2014-08-12 DIAGNOSIS — N2581 Secondary hyperparathyroidism of renal origin: Secondary | ICD-10-CM | POA: Diagnosis not present

## 2014-08-12 DIAGNOSIS — N186 End stage renal disease: Secondary | ICD-10-CM | POA: Diagnosis not present

## 2014-08-12 DIAGNOSIS — D509 Iron deficiency anemia, unspecified: Secondary | ICD-10-CM | POA: Diagnosis not present

## 2014-08-12 DIAGNOSIS — D631 Anemia in chronic kidney disease: Secondary | ICD-10-CM | POA: Diagnosis not present

## 2014-08-12 DIAGNOSIS — E119 Type 2 diabetes mellitus without complications: Secondary | ICD-10-CM | POA: Diagnosis not present

## 2014-08-14 DIAGNOSIS — E119 Type 2 diabetes mellitus without complications: Secondary | ICD-10-CM | POA: Diagnosis not present

## 2014-08-14 DIAGNOSIS — N2581 Secondary hyperparathyroidism of renal origin: Secondary | ICD-10-CM | POA: Diagnosis not present

## 2014-08-14 DIAGNOSIS — D631 Anemia in chronic kidney disease: Secondary | ICD-10-CM | POA: Diagnosis not present

## 2014-08-14 DIAGNOSIS — D509 Iron deficiency anemia, unspecified: Secondary | ICD-10-CM | POA: Diagnosis not present

## 2014-08-14 DIAGNOSIS — N186 End stage renal disease: Secondary | ICD-10-CM | POA: Diagnosis not present

## 2014-08-17 DIAGNOSIS — D631 Anemia in chronic kidney disease: Secondary | ICD-10-CM | POA: Diagnosis not present

## 2014-08-17 DIAGNOSIS — D509 Iron deficiency anemia, unspecified: Secondary | ICD-10-CM | POA: Diagnosis not present

## 2014-08-17 DIAGNOSIS — E119 Type 2 diabetes mellitus without complications: Secondary | ICD-10-CM | POA: Diagnosis not present

## 2014-08-17 DIAGNOSIS — N186 End stage renal disease: Secondary | ICD-10-CM | POA: Diagnosis not present

## 2014-08-17 DIAGNOSIS — N2581 Secondary hyperparathyroidism of renal origin: Secondary | ICD-10-CM | POA: Diagnosis not present

## 2014-08-19 DIAGNOSIS — N2581 Secondary hyperparathyroidism of renal origin: Secondary | ICD-10-CM | POA: Diagnosis not present

## 2014-08-19 DIAGNOSIS — N186 End stage renal disease: Secondary | ICD-10-CM | POA: Diagnosis not present

## 2014-08-19 DIAGNOSIS — D509 Iron deficiency anemia, unspecified: Secondary | ICD-10-CM | POA: Diagnosis not present

## 2014-08-19 DIAGNOSIS — E119 Type 2 diabetes mellitus without complications: Secondary | ICD-10-CM | POA: Diagnosis not present

## 2014-08-19 DIAGNOSIS — D631 Anemia in chronic kidney disease: Secondary | ICD-10-CM | POA: Diagnosis not present

## 2014-08-21 DIAGNOSIS — D509 Iron deficiency anemia, unspecified: Secondary | ICD-10-CM | POA: Diagnosis not present

## 2014-08-21 DIAGNOSIS — N186 End stage renal disease: Secondary | ICD-10-CM | POA: Diagnosis not present

## 2014-08-21 DIAGNOSIS — D631 Anemia in chronic kidney disease: Secondary | ICD-10-CM | POA: Diagnosis not present

## 2014-08-21 DIAGNOSIS — E119 Type 2 diabetes mellitus without complications: Secondary | ICD-10-CM | POA: Diagnosis not present

## 2014-08-21 DIAGNOSIS — N2581 Secondary hyperparathyroidism of renal origin: Secondary | ICD-10-CM | POA: Diagnosis not present

## 2014-08-24 ENCOUNTER — Encounter: Payer: Self-pay | Admitting: Pharmacist

## 2014-08-24 DIAGNOSIS — D631 Anemia in chronic kidney disease: Secondary | ICD-10-CM | POA: Diagnosis not present

## 2014-08-24 DIAGNOSIS — N186 End stage renal disease: Secondary | ICD-10-CM | POA: Diagnosis not present

## 2014-08-24 DIAGNOSIS — D509 Iron deficiency anemia, unspecified: Secondary | ICD-10-CM | POA: Diagnosis not present

## 2014-08-24 DIAGNOSIS — N2581 Secondary hyperparathyroidism of renal origin: Secondary | ICD-10-CM | POA: Diagnosis not present

## 2014-08-24 DIAGNOSIS — E119 Type 2 diabetes mellitus without complications: Secondary | ICD-10-CM | POA: Diagnosis not present

## 2014-08-26 DIAGNOSIS — E119 Type 2 diabetes mellitus without complications: Secondary | ICD-10-CM | POA: Diagnosis not present

## 2014-08-26 DIAGNOSIS — D509 Iron deficiency anemia, unspecified: Secondary | ICD-10-CM | POA: Diagnosis not present

## 2014-08-26 DIAGNOSIS — N2581 Secondary hyperparathyroidism of renal origin: Secondary | ICD-10-CM | POA: Diagnosis not present

## 2014-08-26 DIAGNOSIS — D631 Anemia in chronic kidney disease: Secondary | ICD-10-CM | POA: Diagnosis not present

## 2014-08-26 DIAGNOSIS — N186 End stage renal disease: Secondary | ICD-10-CM | POA: Diagnosis not present

## 2014-08-28 DIAGNOSIS — D631 Anemia in chronic kidney disease: Secondary | ICD-10-CM | POA: Diagnosis not present

## 2014-08-28 DIAGNOSIS — N2581 Secondary hyperparathyroidism of renal origin: Secondary | ICD-10-CM | POA: Diagnosis not present

## 2014-08-28 DIAGNOSIS — D509 Iron deficiency anemia, unspecified: Secondary | ICD-10-CM | POA: Diagnosis not present

## 2014-08-28 DIAGNOSIS — E119 Type 2 diabetes mellitus without complications: Secondary | ICD-10-CM | POA: Diagnosis not present

## 2014-08-28 DIAGNOSIS — N186 End stage renal disease: Secondary | ICD-10-CM | POA: Diagnosis not present

## 2014-08-31 DIAGNOSIS — D509 Iron deficiency anemia, unspecified: Secondary | ICD-10-CM | POA: Diagnosis not present

## 2014-08-31 DIAGNOSIS — N2581 Secondary hyperparathyroidism of renal origin: Secondary | ICD-10-CM | POA: Diagnosis not present

## 2014-08-31 DIAGNOSIS — E119 Type 2 diabetes mellitus without complications: Secondary | ICD-10-CM | POA: Diagnosis not present

## 2014-08-31 DIAGNOSIS — D631 Anemia in chronic kidney disease: Secondary | ICD-10-CM | POA: Diagnosis not present

## 2014-08-31 DIAGNOSIS — N186 End stage renal disease: Secondary | ICD-10-CM | POA: Diagnosis not present

## 2014-09-02 DIAGNOSIS — D509 Iron deficiency anemia, unspecified: Secondary | ICD-10-CM | POA: Diagnosis not present

## 2014-09-02 DIAGNOSIS — D631 Anemia in chronic kidney disease: Secondary | ICD-10-CM | POA: Diagnosis not present

## 2014-09-02 DIAGNOSIS — N2581 Secondary hyperparathyroidism of renal origin: Secondary | ICD-10-CM | POA: Diagnosis not present

## 2014-09-02 DIAGNOSIS — E119 Type 2 diabetes mellitus without complications: Secondary | ICD-10-CM | POA: Diagnosis not present

## 2014-09-02 DIAGNOSIS — N186 End stage renal disease: Secondary | ICD-10-CM | POA: Diagnosis not present

## 2014-09-04 DIAGNOSIS — D631 Anemia in chronic kidney disease: Secondary | ICD-10-CM | POA: Diagnosis not present

## 2014-09-04 DIAGNOSIS — D509 Iron deficiency anemia, unspecified: Secondary | ICD-10-CM | POA: Diagnosis not present

## 2014-09-04 DIAGNOSIS — N2581 Secondary hyperparathyroidism of renal origin: Secondary | ICD-10-CM | POA: Diagnosis not present

## 2014-09-04 DIAGNOSIS — N186 End stage renal disease: Secondary | ICD-10-CM | POA: Diagnosis not present

## 2014-09-04 DIAGNOSIS — E119 Type 2 diabetes mellitus without complications: Secondary | ICD-10-CM | POA: Diagnosis not present

## 2014-09-07 DIAGNOSIS — D509 Iron deficiency anemia, unspecified: Secondary | ICD-10-CM | POA: Diagnosis not present

## 2014-09-07 DIAGNOSIS — E119 Type 2 diabetes mellitus without complications: Secondary | ICD-10-CM | POA: Diagnosis not present

## 2014-09-07 DIAGNOSIS — N186 End stage renal disease: Secondary | ICD-10-CM | POA: Diagnosis not present

## 2014-09-07 DIAGNOSIS — N2581 Secondary hyperparathyroidism of renal origin: Secondary | ICD-10-CM | POA: Diagnosis not present

## 2014-09-07 DIAGNOSIS — D631 Anemia in chronic kidney disease: Secondary | ICD-10-CM | POA: Diagnosis not present

## 2014-09-08 DIAGNOSIS — T8612 Kidney transplant failure: Secondary | ICD-10-CM | POA: Diagnosis not present

## 2014-09-08 DIAGNOSIS — Z992 Dependence on renal dialysis: Secondary | ICD-10-CM | POA: Diagnosis not present

## 2014-09-08 DIAGNOSIS — N186 End stage renal disease: Secondary | ICD-10-CM | POA: Diagnosis not present

## 2014-09-09 DIAGNOSIS — D509 Iron deficiency anemia, unspecified: Secondary | ICD-10-CM | POA: Diagnosis not present

## 2014-09-09 DIAGNOSIS — E119 Type 2 diabetes mellitus without complications: Secondary | ICD-10-CM | POA: Diagnosis not present

## 2014-09-09 DIAGNOSIS — N186 End stage renal disease: Secondary | ICD-10-CM | POA: Diagnosis not present

## 2014-09-09 DIAGNOSIS — N2581 Secondary hyperparathyroidism of renal origin: Secondary | ICD-10-CM | POA: Diagnosis not present

## 2014-09-09 DIAGNOSIS — D631 Anemia in chronic kidney disease: Secondary | ICD-10-CM | POA: Diagnosis not present

## 2014-09-09 DIAGNOSIS — Z23 Encounter for immunization: Secondary | ICD-10-CM | POA: Diagnosis not present

## 2014-09-11 DIAGNOSIS — D631 Anemia in chronic kidney disease: Secondary | ICD-10-CM | POA: Diagnosis not present

## 2014-09-11 DIAGNOSIS — E119 Type 2 diabetes mellitus without complications: Secondary | ICD-10-CM | POA: Diagnosis not present

## 2014-09-11 DIAGNOSIS — N2581 Secondary hyperparathyroidism of renal origin: Secondary | ICD-10-CM | POA: Diagnosis not present

## 2014-09-11 DIAGNOSIS — Z23 Encounter for immunization: Secondary | ICD-10-CM | POA: Diagnosis not present

## 2014-09-11 DIAGNOSIS — D509 Iron deficiency anemia, unspecified: Secondary | ICD-10-CM | POA: Diagnosis not present

## 2014-09-11 DIAGNOSIS — N186 End stage renal disease: Secondary | ICD-10-CM | POA: Diagnosis not present

## 2014-09-14 DIAGNOSIS — D631 Anemia in chronic kidney disease: Secondary | ICD-10-CM | POA: Diagnosis not present

## 2014-09-14 DIAGNOSIS — D509 Iron deficiency anemia, unspecified: Secondary | ICD-10-CM | POA: Diagnosis not present

## 2014-09-14 DIAGNOSIS — N186 End stage renal disease: Secondary | ICD-10-CM | POA: Diagnosis not present

## 2014-09-14 DIAGNOSIS — N2581 Secondary hyperparathyroidism of renal origin: Secondary | ICD-10-CM | POA: Diagnosis not present

## 2014-09-14 DIAGNOSIS — E119 Type 2 diabetes mellitus without complications: Secondary | ICD-10-CM | POA: Diagnosis not present

## 2014-09-14 DIAGNOSIS — Z23 Encounter for immunization: Secondary | ICD-10-CM | POA: Diagnosis not present

## 2014-09-16 DIAGNOSIS — D509 Iron deficiency anemia, unspecified: Secondary | ICD-10-CM | POA: Diagnosis not present

## 2014-09-16 DIAGNOSIS — E119 Type 2 diabetes mellitus without complications: Secondary | ICD-10-CM | POA: Diagnosis not present

## 2014-09-16 DIAGNOSIS — D631 Anemia in chronic kidney disease: Secondary | ICD-10-CM | POA: Diagnosis not present

## 2014-09-16 DIAGNOSIS — Z23 Encounter for immunization: Secondary | ICD-10-CM | POA: Diagnosis not present

## 2014-09-16 DIAGNOSIS — N2581 Secondary hyperparathyroidism of renal origin: Secondary | ICD-10-CM | POA: Diagnosis not present

## 2014-09-16 DIAGNOSIS — N186 End stage renal disease: Secondary | ICD-10-CM | POA: Diagnosis not present

## 2014-09-18 DIAGNOSIS — Z23 Encounter for immunization: Secondary | ICD-10-CM | POA: Diagnosis not present

## 2014-09-18 DIAGNOSIS — D509 Iron deficiency anemia, unspecified: Secondary | ICD-10-CM | POA: Diagnosis not present

## 2014-09-18 DIAGNOSIS — N2581 Secondary hyperparathyroidism of renal origin: Secondary | ICD-10-CM | POA: Diagnosis not present

## 2014-09-18 DIAGNOSIS — N186 End stage renal disease: Secondary | ICD-10-CM | POA: Diagnosis not present

## 2014-09-18 DIAGNOSIS — E119 Type 2 diabetes mellitus without complications: Secondary | ICD-10-CM | POA: Diagnosis not present

## 2014-09-18 DIAGNOSIS — D631 Anemia in chronic kidney disease: Secondary | ICD-10-CM | POA: Diagnosis not present

## 2014-09-21 DIAGNOSIS — D509 Iron deficiency anemia, unspecified: Secondary | ICD-10-CM | POA: Diagnosis not present

## 2014-09-21 DIAGNOSIS — N186 End stage renal disease: Secondary | ICD-10-CM | POA: Diagnosis not present

## 2014-09-21 DIAGNOSIS — E119 Type 2 diabetes mellitus without complications: Secondary | ICD-10-CM | POA: Diagnosis not present

## 2014-09-21 DIAGNOSIS — N2581 Secondary hyperparathyroidism of renal origin: Secondary | ICD-10-CM | POA: Diagnosis not present

## 2014-09-21 DIAGNOSIS — Z23 Encounter for immunization: Secondary | ICD-10-CM | POA: Diagnosis not present

## 2014-09-21 DIAGNOSIS — D631 Anemia in chronic kidney disease: Secondary | ICD-10-CM | POA: Diagnosis not present

## 2014-09-23 DIAGNOSIS — Z23 Encounter for immunization: Secondary | ICD-10-CM | POA: Diagnosis not present

## 2014-09-23 DIAGNOSIS — E119 Type 2 diabetes mellitus without complications: Secondary | ICD-10-CM | POA: Diagnosis not present

## 2014-09-23 DIAGNOSIS — D509 Iron deficiency anemia, unspecified: Secondary | ICD-10-CM | POA: Diagnosis not present

## 2014-09-23 DIAGNOSIS — N186 End stage renal disease: Secondary | ICD-10-CM | POA: Diagnosis not present

## 2014-09-23 DIAGNOSIS — N2581 Secondary hyperparathyroidism of renal origin: Secondary | ICD-10-CM | POA: Diagnosis not present

## 2014-09-23 DIAGNOSIS — D631 Anemia in chronic kidney disease: Secondary | ICD-10-CM | POA: Diagnosis not present

## 2014-09-25 DIAGNOSIS — Z23 Encounter for immunization: Secondary | ICD-10-CM | POA: Diagnosis not present

## 2014-09-25 DIAGNOSIS — D509 Iron deficiency anemia, unspecified: Secondary | ICD-10-CM | POA: Diagnosis not present

## 2014-09-25 DIAGNOSIS — E119 Type 2 diabetes mellitus without complications: Secondary | ICD-10-CM | POA: Diagnosis not present

## 2014-09-25 DIAGNOSIS — N2581 Secondary hyperparathyroidism of renal origin: Secondary | ICD-10-CM | POA: Diagnosis not present

## 2014-09-25 DIAGNOSIS — D631 Anemia in chronic kidney disease: Secondary | ICD-10-CM | POA: Diagnosis not present

## 2014-09-25 DIAGNOSIS — N186 End stage renal disease: Secondary | ICD-10-CM | POA: Diagnosis not present

## 2014-09-28 DIAGNOSIS — Z23 Encounter for immunization: Secondary | ICD-10-CM | POA: Diagnosis not present

## 2014-09-28 DIAGNOSIS — D509 Iron deficiency anemia, unspecified: Secondary | ICD-10-CM | POA: Diagnosis not present

## 2014-09-28 DIAGNOSIS — E119 Type 2 diabetes mellitus without complications: Secondary | ICD-10-CM | POA: Diagnosis not present

## 2014-09-28 DIAGNOSIS — N2581 Secondary hyperparathyroidism of renal origin: Secondary | ICD-10-CM | POA: Diagnosis not present

## 2014-09-28 DIAGNOSIS — N186 End stage renal disease: Secondary | ICD-10-CM | POA: Diagnosis not present

## 2014-09-28 DIAGNOSIS — D631 Anemia in chronic kidney disease: Secondary | ICD-10-CM | POA: Diagnosis not present

## 2014-09-30 DIAGNOSIS — Z23 Encounter for immunization: Secondary | ICD-10-CM | POA: Diagnosis not present

## 2014-09-30 DIAGNOSIS — N186 End stage renal disease: Secondary | ICD-10-CM | POA: Diagnosis not present

## 2014-09-30 DIAGNOSIS — E119 Type 2 diabetes mellitus without complications: Secondary | ICD-10-CM | POA: Diagnosis not present

## 2014-09-30 DIAGNOSIS — D509 Iron deficiency anemia, unspecified: Secondary | ICD-10-CM | POA: Diagnosis not present

## 2014-09-30 DIAGNOSIS — D631 Anemia in chronic kidney disease: Secondary | ICD-10-CM | POA: Diagnosis not present

## 2014-09-30 DIAGNOSIS — N2581 Secondary hyperparathyroidism of renal origin: Secondary | ICD-10-CM | POA: Diagnosis not present

## 2014-10-02 DIAGNOSIS — E119 Type 2 diabetes mellitus without complications: Secondary | ICD-10-CM | POA: Diagnosis not present

## 2014-10-02 DIAGNOSIS — D509 Iron deficiency anemia, unspecified: Secondary | ICD-10-CM | POA: Diagnosis not present

## 2014-10-02 DIAGNOSIS — N186 End stage renal disease: Secondary | ICD-10-CM | POA: Diagnosis not present

## 2014-10-02 DIAGNOSIS — Z23 Encounter for immunization: Secondary | ICD-10-CM | POA: Diagnosis not present

## 2014-10-02 DIAGNOSIS — D631 Anemia in chronic kidney disease: Secondary | ICD-10-CM | POA: Diagnosis not present

## 2014-10-02 DIAGNOSIS — N2581 Secondary hyperparathyroidism of renal origin: Secondary | ICD-10-CM | POA: Diagnosis not present

## 2014-10-05 DIAGNOSIS — Z23 Encounter for immunization: Secondary | ICD-10-CM | POA: Diagnosis not present

## 2014-10-05 DIAGNOSIS — N186 End stage renal disease: Secondary | ICD-10-CM | POA: Diagnosis not present

## 2014-10-05 DIAGNOSIS — D631 Anemia in chronic kidney disease: Secondary | ICD-10-CM | POA: Diagnosis not present

## 2014-10-05 DIAGNOSIS — N2581 Secondary hyperparathyroidism of renal origin: Secondary | ICD-10-CM | POA: Diagnosis not present

## 2014-10-05 DIAGNOSIS — E119 Type 2 diabetes mellitus without complications: Secondary | ICD-10-CM | POA: Diagnosis not present

## 2014-10-05 DIAGNOSIS — D509 Iron deficiency anemia, unspecified: Secondary | ICD-10-CM | POA: Diagnosis not present

## 2014-10-07 DIAGNOSIS — E119 Type 2 diabetes mellitus without complications: Secondary | ICD-10-CM | POA: Diagnosis not present

## 2014-10-07 DIAGNOSIS — Z23 Encounter for immunization: Secondary | ICD-10-CM | POA: Diagnosis not present

## 2014-10-07 DIAGNOSIS — N186 End stage renal disease: Secondary | ICD-10-CM | POA: Diagnosis not present

## 2014-10-07 DIAGNOSIS — D631 Anemia in chronic kidney disease: Secondary | ICD-10-CM | POA: Diagnosis not present

## 2014-10-07 DIAGNOSIS — N2581 Secondary hyperparathyroidism of renal origin: Secondary | ICD-10-CM | POA: Diagnosis not present

## 2014-10-07 DIAGNOSIS — D509 Iron deficiency anemia, unspecified: Secondary | ICD-10-CM | POA: Diagnosis not present

## 2014-10-08 DIAGNOSIS — T8612 Kidney transplant failure: Secondary | ICD-10-CM | POA: Diagnosis not present

## 2014-10-08 DIAGNOSIS — N186 End stage renal disease: Secondary | ICD-10-CM | POA: Diagnosis not present

## 2014-10-08 DIAGNOSIS — Z992 Dependence on renal dialysis: Secondary | ICD-10-CM | POA: Diagnosis not present

## 2014-10-09 DIAGNOSIS — N2581 Secondary hyperparathyroidism of renal origin: Secondary | ICD-10-CM | POA: Diagnosis not present

## 2014-10-09 DIAGNOSIS — E119 Type 2 diabetes mellitus without complications: Secondary | ICD-10-CM | POA: Diagnosis not present

## 2014-10-09 DIAGNOSIS — D631 Anemia in chronic kidney disease: Secondary | ICD-10-CM | POA: Diagnosis not present

## 2014-10-09 DIAGNOSIS — D509 Iron deficiency anemia, unspecified: Secondary | ICD-10-CM | POA: Diagnosis not present

## 2014-10-09 DIAGNOSIS — N186 End stage renal disease: Secondary | ICD-10-CM | POA: Diagnosis not present

## 2014-10-12 DIAGNOSIS — D509 Iron deficiency anemia, unspecified: Secondary | ICD-10-CM | POA: Diagnosis not present

## 2014-10-12 DIAGNOSIS — N2581 Secondary hyperparathyroidism of renal origin: Secondary | ICD-10-CM | POA: Diagnosis not present

## 2014-10-12 DIAGNOSIS — D631 Anemia in chronic kidney disease: Secondary | ICD-10-CM | POA: Diagnosis not present

## 2014-10-12 DIAGNOSIS — N186 End stage renal disease: Secondary | ICD-10-CM | POA: Diagnosis not present

## 2014-10-12 DIAGNOSIS — E119 Type 2 diabetes mellitus without complications: Secondary | ICD-10-CM | POA: Diagnosis not present

## 2014-10-14 DIAGNOSIS — N2581 Secondary hyperparathyroidism of renal origin: Secondary | ICD-10-CM | POA: Diagnosis not present

## 2014-10-14 DIAGNOSIS — D509 Iron deficiency anemia, unspecified: Secondary | ICD-10-CM | POA: Diagnosis not present

## 2014-10-14 DIAGNOSIS — N186 End stage renal disease: Secondary | ICD-10-CM | POA: Diagnosis not present

## 2014-10-14 DIAGNOSIS — E119 Type 2 diabetes mellitus without complications: Secondary | ICD-10-CM | POA: Diagnosis not present

## 2014-10-14 DIAGNOSIS — D631 Anemia in chronic kidney disease: Secondary | ICD-10-CM | POA: Diagnosis not present

## 2014-10-16 DIAGNOSIS — D631 Anemia in chronic kidney disease: Secondary | ICD-10-CM | POA: Diagnosis not present

## 2014-10-16 DIAGNOSIS — N2581 Secondary hyperparathyroidism of renal origin: Secondary | ICD-10-CM | POA: Diagnosis not present

## 2014-10-16 DIAGNOSIS — E119 Type 2 diabetes mellitus without complications: Secondary | ICD-10-CM | POA: Diagnosis not present

## 2014-10-16 DIAGNOSIS — N186 End stage renal disease: Secondary | ICD-10-CM | POA: Diagnosis not present

## 2014-10-16 DIAGNOSIS — D509 Iron deficiency anemia, unspecified: Secondary | ICD-10-CM | POA: Diagnosis not present

## 2014-10-19 DIAGNOSIS — N2581 Secondary hyperparathyroidism of renal origin: Secondary | ICD-10-CM | POA: Diagnosis not present

## 2014-10-19 DIAGNOSIS — D509 Iron deficiency anemia, unspecified: Secondary | ICD-10-CM | POA: Diagnosis not present

## 2014-10-19 DIAGNOSIS — E119 Type 2 diabetes mellitus without complications: Secondary | ICD-10-CM | POA: Diagnosis not present

## 2014-10-19 DIAGNOSIS — D631 Anemia in chronic kidney disease: Secondary | ICD-10-CM | POA: Diagnosis not present

## 2014-10-19 DIAGNOSIS — N186 End stage renal disease: Secondary | ICD-10-CM | POA: Diagnosis not present

## 2014-10-21 DIAGNOSIS — E119 Type 2 diabetes mellitus without complications: Secondary | ICD-10-CM | POA: Diagnosis not present

## 2014-10-21 DIAGNOSIS — N186 End stage renal disease: Secondary | ICD-10-CM | POA: Diagnosis not present

## 2014-10-21 DIAGNOSIS — D509 Iron deficiency anemia, unspecified: Secondary | ICD-10-CM | POA: Diagnosis not present

## 2014-10-21 DIAGNOSIS — D631 Anemia in chronic kidney disease: Secondary | ICD-10-CM | POA: Diagnosis not present

## 2014-10-21 DIAGNOSIS — N2581 Secondary hyperparathyroidism of renal origin: Secondary | ICD-10-CM | POA: Diagnosis not present

## 2014-10-23 DIAGNOSIS — N186 End stage renal disease: Secondary | ICD-10-CM | POA: Diagnosis not present

## 2014-10-23 DIAGNOSIS — E119 Type 2 diabetes mellitus without complications: Secondary | ICD-10-CM | POA: Diagnosis not present

## 2014-10-23 DIAGNOSIS — D509 Iron deficiency anemia, unspecified: Secondary | ICD-10-CM | POA: Diagnosis not present

## 2014-10-23 DIAGNOSIS — N2581 Secondary hyperparathyroidism of renal origin: Secondary | ICD-10-CM | POA: Diagnosis not present

## 2014-10-23 DIAGNOSIS — D631 Anemia in chronic kidney disease: Secondary | ICD-10-CM | POA: Diagnosis not present

## 2014-10-26 DIAGNOSIS — D631 Anemia in chronic kidney disease: Secondary | ICD-10-CM | POA: Diagnosis not present

## 2014-10-26 DIAGNOSIS — D509 Iron deficiency anemia, unspecified: Secondary | ICD-10-CM | POA: Diagnosis not present

## 2014-10-26 DIAGNOSIS — N186 End stage renal disease: Secondary | ICD-10-CM | POA: Diagnosis not present

## 2014-10-26 DIAGNOSIS — E119 Type 2 diabetes mellitus without complications: Secondary | ICD-10-CM | POA: Diagnosis not present

## 2014-10-26 DIAGNOSIS — N2581 Secondary hyperparathyroidism of renal origin: Secondary | ICD-10-CM | POA: Diagnosis not present

## 2014-10-28 DIAGNOSIS — D509 Iron deficiency anemia, unspecified: Secondary | ICD-10-CM | POA: Diagnosis not present

## 2014-10-28 DIAGNOSIS — N186 End stage renal disease: Secondary | ICD-10-CM | POA: Diagnosis not present

## 2014-10-28 DIAGNOSIS — D631 Anemia in chronic kidney disease: Secondary | ICD-10-CM | POA: Diagnosis not present

## 2014-10-28 DIAGNOSIS — E119 Type 2 diabetes mellitus without complications: Secondary | ICD-10-CM | POA: Diagnosis not present

## 2014-10-28 DIAGNOSIS — N2581 Secondary hyperparathyroidism of renal origin: Secondary | ICD-10-CM | POA: Diagnosis not present

## 2014-10-30 DIAGNOSIS — D631 Anemia in chronic kidney disease: Secondary | ICD-10-CM | POA: Diagnosis not present

## 2014-10-30 DIAGNOSIS — N186 End stage renal disease: Secondary | ICD-10-CM | POA: Diagnosis not present

## 2014-10-30 DIAGNOSIS — E119 Type 2 diabetes mellitus without complications: Secondary | ICD-10-CM | POA: Diagnosis not present

## 2014-10-30 DIAGNOSIS — N2581 Secondary hyperparathyroidism of renal origin: Secondary | ICD-10-CM | POA: Diagnosis not present

## 2014-10-30 DIAGNOSIS — D509 Iron deficiency anemia, unspecified: Secondary | ICD-10-CM | POA: Diagnosis not present

## 2014-11-02 DIAGNOSIS — D509 Iron deficiency anemia, unspecified: Secondary | ICD-10-CM | POA: Diagnosis not present

## 2014-11-02 DIAGNOSIS — D631 Anemia in chronic kidney disease: Secondary | ICD-10-CM | POA: Diagnosis not present

## 2014-11-02 DIAGNOSIS — N2581 Secondary hyperparathyroidism of renal origin: Secondary | ICD-10-CM | POA: Diagnosis not present

## 2014-11-02 DIAGNOSIS — N186 End stage renal disease: Secondary | ICD-10-CM | POA: Diagnosis not present

## 2014-11-02 DIAGNOSIS — E119 Type 2 diabetes mellitus without complications: Secondary | ICD-10-CM | POA: Diagnosis not present

## 2014-11-04 DIAGNOSIS — D631 Anemia in chronic kidney disease: Secondary | ICD-10-CM | POA: Diagnosis not present

## 2014-11-04 DIAGNOSIS — N2581 Secondary hyperparathyroidism of renal origin: Secondary | ICD-10-CM | POA: Diagnosis not present

## 2014-11-04 DIAGNOSIS — E119 Type 2 diabetes mellitus without complications: Secondary | ICD-10-CM | POA: Diagnosis not present

## 2014-11-04 DIAGNOSIS — D509 Iron deficiency anemia, unspecified: Secondary | ICD-10-CM | POA: Diagnosis not present

## 2014-11-04 DIAGNOSIS — N186 End stage renal disease: Secondary | ICD-10-CM | POA: Diagnosis not present

## 2014-11-06 DIAGNOSIS — D509 Iron deficiency anemia, unspecified: Secondary | ICD-10-CM | POA: Diagnosis not present

## 2014-11-06 DIAGNOSIS — E119 Type 2 diabetes mellitus without complications: Secondary | ICD-10-CM | POA: Diagnosis not present

## 2014-11-06 DIAGNOSIS — D631 Anemia in chronic kidney disease: Secondary | ICD-10-CM | POA: Diagnosis not present

## 2014-11-06 DIAGNOSIS — N2581 Secondary hyperparathyroidism of renal origin: Secondary | ICD-10-CM | POA: Diagnosis not present

## 2014-11-06 DIAGNOSIS — N186 End stage renal disease: Secondary | ICD-10-CM | POA: Diagnosis not present

## 2014-11-08 DIAGNOSIS — T8612 Kidney transplant failure: Secondary | ICD-10-CM | POA: Diagnosis not present

## 2014-11-08 DIAGNOSIS — Z992 Dependence on renal dialysis: Secondary | ICD-10-CM | POA: Diagnosis not present

## 2014-11-08 DIAGNOSIS — N186 End stage renal disease: Secondary | ICD-10-CM | POA: Diagnosis not present

## 2014-11-09 DIAGNOSIS — D509 Iron deficiency anemia, unspecified: Secondary | ICD-10-CM | POA: Diagnosis not present

## 2014-11-09 DIAGNOSIS — E119 Type 2 diabetes mellitus without complications: Secondary | ICD-10-CM | POA: Diagnosis not present

## 2014-11-09 DIAGNOSIS — N2581 Secondary hyperparathyroidism of renal origin: Secondary | ICD-10-CM | POA: Diagnosis not present

## 2014-11-09 DIAGNOSIS — N186 End stage renal disease: Secondary | ICD-10-CM | POA: Diagnosis not present

## 2014-11-09 DIAGNOSIS — D631 Anemia in chronic kidney disease: Secondary | ICD-10-CM | POA: Diagnosis not present

## 2014-11-11 DIAGNOSIS — D631 Anemia in chronic kidney disease: Secondary | ICD-10-CM | POA: Diagnosis not present

## 2014-11-11 DIAGNOSIS — E119 Type 2 diabetes mellitus without complications: Secondary | ICD-10-CM | POA: Diagnosis not present

## 2014-11-11 DIAGNOSIS — N2581 Secondary hyperparathyroidism of renal origin: Secondary | ICD-10-CM | POA: Diagnosis not present

## 2014-11-11 DIAGNOSIS — N186 End stage renal disease: Secondary | ICD-10-CM | POA: Diagnosis not present

## 2014-11-11 DIAGNOSIS — D509 Iron deficiency anemia, unspecified: Secondary | ICD-10-CM | POA: Diagnosis not present

## 2014-11-13 DIAGNOSIS — N2581 Secondary hyperparathyroidism of renal origin: Secondary | ICD-10-CM | POA: Diagnosis not present

## 2014-11-13 DIAGNOSIS — E119 Type 2 diabetes mellitus without complications: Secondary | ICD-10-CM | POA: Diagnosis not present

## 2014-11-13 DIAGNOSIS — D631 Anemia in chronic kidney disease: Secondary | ICD-10-CM | POA: Diagnosis not present

## 2014-11-13 DIAGNOSIS — N186 End stage renal disease: Secondary | ICD-10-CM | POA: Diagnosis not present

## 2014-11-13 DIAGNOSIS — D509 Iron deficiency anemia, unspecified: Secondary | ICD-10-CM | POA: Diagnosis not present

## 2014-11-16 DIAGNOSIS — E119 Type 2 diabetes mellitus without complications: Secondary | ICD-10-CM | POA: Diagnosis not present

## 2014-11-16 DIAGNOSIS — N186 End stage renal disease: Secondary | ICD-10-CM | POA: Diagnosis not present

## 2014-11-16 DIAGNOSIS — D509 Iron deficiency anemia, unspecified: Secondary | ICD-10-CM | POA: Diagnosis not present

## 2014-11-16 DIAGNOSIS — N2581 Secondary hyperparathyroidism of renal origin: Secondary | ICD-10-CM | POA: Diagnosis not present

## 2014-11-16 DIAGNOSIS — D631 Anemia in chronic kidney disease: Secondary | ICD-10-CM | POA: Diagnosis not present

## 2014-11-18 DIAGNOSIS — E119 Type 2 diabetes mellitus without complications: Secondary | ICD-10-CM | POA: Diagnosis not present

## 2014-11-18 DIAGNOSIS — N186 End stage renal disease: Secondary | ICD-10-CM | POA: Diagnosis not present

## 2014-11-18 DIAGNOSIS — D631 Anemia in chronic kidney disease: Secondary | ICD-10-CM | POA: Diagnosis not present

## 2014-11-18 DIAGNOSIS — N2581 Secondary hyperparathyroidism of renal origin: Secondary | ICD-10-CM | POA: Diagnosis not present

## 2014-11-18 DIAGNOSIS — D509 Iron deficiency anemia, unspecified: Secondary | ICD-10-CM | POA: Diagnosis not present

## 2014-11-20 DIAGNOSIS — N186 End stage renal disease: Secondary | ICD-10-CM | POA: Diagnosis not present

## 2014-11-20 DIAGNOSIS — N2581 Secondary hyperparathyroidism of renal origin: Secondary | ICD-10-CM | POA: Diagnosis not present

## 2014-11-20 DIAGNOSIS — D509 Iron deficiency anemia, unspecified: Secondary | ICD-10-CM | POA: Diagnosis not present

## 2014-11-20 DIAGNOSIS — D631 Anemia in chronic kidney disease: Secondary | ICD-10-CM | POA: Diagnosis not present

## 2014-11-20 DIAGNOSIS — E119 Type 2 diabetes mellitus without complications: Secondary | ICD-10-CM | POA: Diagnosis not present

## 2014-11-23 DIAGNOSIS — N2581 Secondary hyperparathyroidism of renal origin: Secondary | ICD-10-CM | POA: Diagnosis not present

## 2014-11-23 DIAGNOSIS — D631 Anemia in chronic kidney disease: Secondary | ICD-10-CM | POA: Diagnosis not present

## 2014-11-23 DIAGNOSIS — E119 Type 2 diabetes mellitus without complications: Secondary | ICD-10-CM | POA: Diagnosis not present

## 2014-11-23 DIAGNOSIS — N186 End stage renal disease: Secondary | ICD-10-CM | POA: Diagnosis not present

## 2014-11-23 DIAGNOSIS — D509 Iron deficiency anemia, unspecified: Secondary | ICD-10-CM | POA: Diagnosis not present

## 2014-11-25 DIAGNOSIS — D509 Iron deficiency anemia, unspecified: Secondary | ICD-10-CM | POA: Diagnosis not present

## 2014-11-25 DIAGNOSIS — N186 End stage renal disease: Secondary | ICD-10-CM | POA: Diagnosis not present

## 2014-11-25 DIAGNOSIS — E119 Type 2 diabetes mellitus without complications: Secondary | ICD-10-CM | POA: Diagnosis not present

## 2014-11-25 DIAGNOSIS — N2581 Secondary hyperparathyroidism of renal origin: Secondary | ICD-10-CM | POA: Diagnosis not present

## 2014-11-25 DIAGNOSIS — D631 Anemia in chronic kidney disease: Secondary | ICD-10-CM | POA: Diagnosis not present

## 2014-11-27 DIAGNOSIS — N2581 Secondary hyperparathyroidism of renal origin: Secondary | ICD-10-CM | POA: Diagnosis not present

## 2014-11-27 DIAGNOSIS — E119 Type 2 diabetes mellitus without complications: Secondary | ICD-10-CM | POA: Diagnosis not present

## 2014-11-27 DIAGNOSIS — D631 Anemia in chronic kidney disease: Secondary | ICD-10-CM | POA: Diagnosis not present

## 2014-11-27 DIAGNOSIS — N186 End stage renal disease: Secondary | ICD-10-CM | POA: Diagnosis not present

## 2014-11-27 DIAGNOSIS — D509 Iron deficiency anemia, unspecified: Secondary | ICD-10-CM | POA: Diagnosis not present

## 2014-11-30 DIAGNOSIS — D509 Iron deficiency anemia, unspecified: Secondary | ICD-10-CM | POA: Diagnosis not present

## 2014-11-30 DIAGNOSIS — D631 Anemia in chronic kidney disease: Secondary | ICD-10-CM | POA: Diagnosis not present

## 2014-11-30 DIAGNOSIS — E119 Type 2 diabetes mellitus without complications: Secondary | ICD-10-CM | POA: Diagnosis not present

## 2014-11-30 DIAGNOSIS — N186 End stage renal disease: Secondary | ICD-10-CM | POA: Diagnosis not present

## 2014-11-30 DIAGNOSIS — N2581 Secondary hyperparathyroidism of renal origin: Secondary | ICD-10-CM | POA: Diagnosis not present

## 2014-12-03 DIAGNOSIS — N186 End stage renal disease: Secondary | ICD-10-CM | POA: Diagnosis not present

## 2014-12-03 DIAGNOSIS — N2581 Secondary hyperparathyroidism of renal origin: Secondary | ICD-10-CM | POA: Diagnosis not present

## 2014-12-03 DIAGNOSIS — E119 Type 2 diabetes mellitus without complications: Secondary | ICD-10-CM | POA: Diagnosis not present

## 2014-12-03 DIAGNOSIS — D509 Iron deficiency anemia, unspecified: Secondary | ICD-10-CM | POA: Diagnosis not present

## 2014-12-03 DIAGNOSIS — D631 Anemia in chronic kidney disease: Secondary | ICD-10-CM | POA: Diagnosis not present

## 2014-12-05 DIAGNOSIS — E119 Type 2 diabetes mellitus without complications: Secondary | ICD-10-CM | POA: Diagnosis not present

## 2014-12-05 DIAGNOSIS — N2581 Secondary hyperparathyroidism of renal origin: Secondary | ICD-10-CM | POA: Diagnosis not present

## 2014-12-05 DIAGNOSIS — D509 Iron deficiency anemia, unspecified: Secondary | ICD-10-CM | POA: Diagnosis not present

## 2014-12-05 DIAGNOSIS — N186 End stage renal disease: Secondary | ICD-10-CM | POA: Diagnosis not present

## 2014-12-05 DIAGNOSIS — D631 Anemia in chronic kidney disease: Secondary | ICD-10-CM | POA: Diagnosis not present

## 2014-12-07 DIAGNOSIS — N2581 Secondary hyperparathyroidism of renal origin: Secondary | ICD-10-CM | POA: Diagnosis not present

## 2014-12-07 DIAGNOSIS — E119 Type 2 diabetes mellitus without complications: Secondary | ICD-10-CM | POA: Diagnosis not present

## 2014-12-07 DIAGNOSIS — D509 Iron deficiency anemia, unspecified: Secondary | ICD-10-CM | POA: Diagnosis not present

## 2014-12-07 DIAGNOSIS — N186 End stage renal disease: Secondary | ICD-10-CM | POA: Diagnosis not present

## 2014-12-07 DIAGNOSIS — D631 Anemia in chronic kidney disease: Secondary | ICD-10-CM | POA: Diagnosis not present

## 2014-12-08 DIAGNOSIS — N186 End stage renal disease: Secondary | ICD-10-CM | POA: Diagnosis not present

## 2014-12-08 DIAGNOSIS — Z992 Dependence on renal dialysis: Secondary | ICD-10-CM | POA: Diagnosis not present

## 2014-12-08 DIAGNOSIS — T8612 Kidney transplant failure: Secondary | ICD-10-CM | POA: Diagnosis not present

## 2014-12-09 ENCOUNTER — Telehealth: Payer: Self-pay | Admitting: Hematology

## 2014-12-09 DIAGNOSIS — D631 Anemia in chronic kidney disease: Secondary | ICD-10-CM | POA: Diagnosis not present

## 2014-12-09 DIAGNOSIS — E119 Type 2 diabetes mellitus without complications: Secondary | ICD-10-CM | POA: Diagnosis not present

## 2014-12-09 DIAGNOSIS — D509 Iron deficiency anemia, unspecified: Secondary | ICD-10-CM | POA: Diagnosis not present

## 2014-12-09 DIAGNOSIS — N2581 Secondary hyperparathyroidism of renal origin: Secondary | ICD-10-CM | POA: Diagnosis not present

## 2014-12-09 DIAGNOSIS — D696 Thrombocytopenia, unspecified: Secondary | ICD-10-CM | POA: Diagnosis not present

## 2014-12-09 DIAGNOSIS — N186 End stage renal disease: Secondary | ICD-10-CM | POA: Diagnosis not present

## 2014-12-09 NOTE — Telephone Encounter (Signed)
S/W PT IN REF TO NP APPT ON 12/22/14@1 :30 DX-THROMBOCYTOPENIA DR Mercy Moore

## 2014-12-11 DIAGNOSIS — N2581 Secondary hyperparathyroidism of renal origin: Secondary | ICD-10-CM | POA: Diagnosis not present

## 2014-12-11 DIAGNOSIS — E119 Type 2 diabetes mellitus without complications: Secondary | ICD-10-CM | POA: Diagnosis not present

## 2014-12-11 DIAGNOSIS — D631 Anemia in chronic kidney disease: Secondary | ICD-10-CM | POA: Diagnosis not present

## 2014-12-11 DIAGNOSIS — D509 Iron deficiency anemia, unspecified: Secondary | ICD-10-CM | POA: Diagnosis not present

## 2014-12-11 DIAGNOSIS — N186 End stage renal disease: Secondary | ICD-10-CM | POA: Diagnosis not present

## 2014-12-14 DIAGNOSIS — E119 Type 2 diabetes mellitus without complications: Secondary | ICD-10-CM | POA: Diagnosis not present

## 2014-12-14 DIAGNOSIS — D631 Anemia in chronic kidney disease: Secondary | ICD-10-CM | POA: Diagnosis not present

## 2014-12-14 DIAGNOSIS — N2581 Secondary hyperparathyroidism of renal origin: Secondary | ICD-10-CM | POA: Diagnosis not present

## 2014-12-14 DIAGNOSIS — N186 End stage renal disease: Secondary | ICD-10-CM | POA: Diagnosis not present

## 2014-12-14 DIAGNOSIS — D509 Iron deficiency anemia, unspecified: Secondary | ICD-10-CM | POA: Diagnosis not present

## 2014-12-16 DIAGNOSIS — D631 Anemia in chronic kidney disease: Secondary | ICD-10-CM | POA: Diagnosis not present

## 2014-12-16 DIAGNOSIS — N186 End stage renal disease: Secondary | ICD-10-CM | POA: Diagnosis not present

## 2014-12-16 DIAGNOSIS — D509 Iron deficiency anemia, unspecified: Secondary | ICD-10-CM | POA: Diagnosis not present

## 2014-12-16 DIAGNOSIS — E119 Type 2 diabetes mellitus without complications: Secondary | ICD-10-CM | POA: Diagnosis not present

## 2014-12-16 DIAGNOSIS — N2581 Secondary hyperparathyroidism of renal origin: Secondary | ICD-10-CM | POA: Diagnosis not present

## 2014-12-18 DIAGNOSIS — E119 Type 2 diabetes mellitus without complications: Secondary | ICD-10-CM | POA: Diagnosis not present

## 2014-12-18 DIAGNOSIS — N2581 Secondary hyperparathyroidism of renal origin: Secondary | ICD-10-CM | POA: Diagnosis not present

## 2014-12-18 DIAGNOSIS — N186 End stage renal disease: Secondary | ICD-10-CM | POA: Diagnosis not present

## 2014-12-18 DIAGNOSIS — D509 Iron deficiency anemia, unspecified: Secondary | ICD-10-CM | POA: Diagnosis not present

## 2014-12-18 DIAGNOSIS — D631 Anemia in chronic kidney disease: Secondary | ICD-10-CM | POA: Diagnosis not present

## 2014-12-21 DIAGNOSIS — E119 Type 2 diabetes mellitus without complications: Secondary | ICD-10-CM | POA: Diagnosis not present

## 2014-12-21 DIAGNOSIS — D631 Anemia in chronic kidney disease: Secondary | ICD-10-CM | POA: Diagnosis not present

## 2014-12-21 DIAGNOSIS — N186 End stage renal disease: Secondary | ICD-10-CM | POA: Diagnosis not present

## 2014-12-21 DIAGNOSIS — D509 Iron deficiency anemia, unspecified: Secondary | ICD-10-CM | POA: Diagnosis not present

## 2014-12-21 DIAGNOSIS — N2581 Secondary hyperparathyroidism of renal origin: Secondary | ICD-10-CM | POA: Diagnosis not present

## 2014-12-22 ENCOUNTER — Ambulatory Visit: Payer: Medicare Other | Admitting: Hematology

## 2014-12-23 DIAGNOSIS — N186 End stage renal disease: Secondary | ICD-10-CM | POA: Diagnosis not present

## 2014-12-23 DIAGNOSIS — N2581 Secondary hyperparathyroidism of renal origin: Secondary | ICD-10-CM | POA: Diagnosis not present

## 2014-12-23 DIAGNOSIS — E119 Type 2 diabetes mellitus without complications: Secondary | ICD-10-CM | POA: Diagnosis not present

## 2014-12-23 DIAGNOSIS — D631 Anemia in chronic kidney disease: Secondary | ICD-10-CM | POA: Diagnosis not present

## 2014-12-23 DIAGNOSIS — D509 Iron deficiency anemia, unspecified: Secondary | ICD-10-CM | POA: Diagnosis not present

## 2014-12-24 ENCOUNTER — Telehealth: Payer: Self-pay | Admitting: Hematology

## 2014-12-24 NOTE — Telephone Encounter (Signed)
PT AWARE OF NEW APPT ON 01/16/14@1 :30

## 2014-12-25 DIAGNOSIS — D509 Iron deficiency anemia, unspecified: Secondary | ICD-10-CM | POA: Diagnosis not present

## 2014-12-25 DIAGNOSIS — D631 Anemia in chronic kidney disease: Secondary | ICD-10-CM | POA: Diagnosis not present

## 2014-12-25 DIAGNOSIS — E119 Type 2 diabetes mellitus without complications: Secondary | ICD-10-CM | POA: Diagnosis not present

## 2014-12-25 DIAGNOSIS — N2581 Secondary hyperparathyroidism of renal origin: Secondary | ICD-10-CM | POA: Diagnosis not present

## 2014-12-25 DIAGNOSIS — N186 End stage renal disease: Secondary | ICD-10-CM | POA: Diagnosis not present

## 2014-12-28 DIAGNOSIS — N186 End stage renal disease: Secondary | ICD-10-CM | POA: Diagnosis not present

## 2014-12-28 DIAGNOSIS — D509 Iron deficiency anemia, unspecified: Secondary | ICD-10-CM | POA: Diagnosis not present

## 2014-12-28 DIAGNOSIS — E119 Type 2 diabetes mellitus without complications: Secondary | ICD-10-CM | POA: Diagnosis not present

## 2014-12-28 DIAGNOSIS — D631 Anemia in chronic kidney disease: Secondary | ICD-10-CM | POA: Diagnosis not present

## 2014-12-28 DIAGNOSIS — N2581 Secondary hyperparathyroidism of renal origin: Secondary | ICD-10-CM | POA: Diagnosis not present

## 2014-12-30 DIAGNOSIS — N2581 Secondary hyperparathyroidism of renal origin: Secondary | ICD-10-CM | POA: Diagnosis not present

## 2014-12-30 DIAGNOSIS — E119 Type 2 diabetes mellitus without complications: Secondary | ICD-10-CM | POA: Diagnosis not present

## 2014-12-30 DIAGNOSIS — N186 End stage renal disease: Secondary | ICD-10-CM | POA: Diagnosis not present

## 2014-12-30 DIAGNOSIS — D631 Anemia in chronic kidney disease: Secondary | ICD-10-CM | POA: Diagnosis not present

## 2014-12-30 DIAGNOSIS — D509 Iron deficiency anemia, unspecified: Secondary | ICD-10-CM | POA: Diagnosis not present

## 2015-01-01 DIAGNOSIS — N186 End stage renal disease: Secondary | ICD-10-CM | POA: Diagnosis not present

## 2015-01-01 DIAGNOSIS — E119 Type 2 diabetes mellitus without complications: Secondary | ICD-10-CM | POA: Diagnosis not present

## 2015-01-01 DIAGNOSIS — D509 Iron deficiency anemia, unspecified: Secondary | ICD-10-CM | POA: Diagnosis not present

## 2015-01-01 DIAGNOSIS — N2581 Secondary hyperparathyroidism of renal origin: Secondary | ICD-10-CM | POA: Diagnosis not present

## 2015-01-01 DIAGNOSIS — D631 Anemia in chronic kidney disease: Secondary | ICD-10-CM | POA: Diagnosis not present

## 2015-01-04 DIAGNOSIS — D509 Iron deficiency anemia, unspecified: Secondary | ICD-10-CM | POA: Diagnosis not present

## 2015-01-04 DIAGNOSIS — E119 Type 2 diabetes mellitus without complications: Secondary | ICD-10-CM | POA: Diagnosis not present

## 2015-01-04 DIAGNOSIS — N186 End stage renal disease: Secondary | ICD-10-CM | POA: Diagnosis not present

## 2015-01-04 DIAGNOSIS — N2581 Secondary hyperparathyroidism of renal origin: Secondary | ICD-10-CM | POA: Diagnosis not present

## 2015-01-04 DIAGNOSIS — D631 Anemia in chronic kidney disease: Secondary | ICD-10-CM | POA: Diagnosis not present

## 2015-01-06 DIAGNOSIS — N2581 Secondary hyperparathyroidism of renal origin: Secondary | ICD-10-CM | POA: Diagnosis not present

## 2015-01-06 DIAGNOSIS — N186 End stage renal disease: Secondary | ICD-10-CM | POA: Diagnosis not present

## 2015-01-06 DIAGNOSIS — E119 Type 2 diabetes mellitus without complications: Secondary | ICD-10-CM | POA: Diagnosis not present

## 2015-01-06 DIAGNOSIS — D509 Iron deficiency anemia, unspecified: Secondary | ICD-10-CM | POA: Diagnosis not present

## 2015-01-06 DIAGNOSIS — D631 Anemia in chronic kidney disease: Secondary | ICD-10-CM | POA: Diagnosis not present

## 2015-01-08 DIAGNOSIS — D509 Iron deficiency anemia, unspecified: Secondary | ICD-10-CM | POA: Diagnosis not present

## 2015-01-08 DIAGNOSIS — Z992 Dependence on renal dialysis: Secondary | ICD-10-CM | POA: Diagnosis not present

## 2015-01-08 DIAGNOSIS — D631 Anemia in chronic kidney disease: Secondary | ICD-10-CM | POA: Diagnosis not present

## 2015-01-08 DIAGNOSIS — N186 End stage renal disease: Secondary | ICD-10-CM | POA: Diagnosis not present

## 2015-01-08 DIAGNOSIS — N2581 Secondary hyperparathyroidism of renal origin: Secondary | ICD-10-CM | POA: Diagnosis not present

## 2015-01-08 DIAGNOSIS — E119 Type 2 diabetes mellitus without complications: Secondary | ICD-10-CM | POA: Diagnosis not present

## 2015-01-08 DIAGNOSIS — T8612 Kidney transplant failure: Secondary | ICD-10-CM | POA: Diagnosis not present

## 2015-01-09 DIAGNOSIS — T8859XA Other complications of anesthesia, initial encounter: Secondary | ICD-10-CM

## 2015-01-09 HISTORY — DX: Other complications of anesthesia, initial encounter: T88.59XA

## 2015-01-11 DIAGNOSIS — E119 Type 2 diabetes mellitus without complications: Secondary | ICD-10-CM | POA: Diagnosis not present

## 2015-01-11 DIAGNOSIS — N2581 Secondary hyperparathyroidism of renal origin: Secondary | ICD-10-CM | POA: Diagnosis not present

## 2015-01-11 DIAGNOSIS — N186 End stage renal disease: Secondary | ICD-10-CM | POA: Diagnosis not present

## 2015-01-11 DIAGNOSIS — D631 Anemia in chronic kidney disease: Secondary | ICD-10-CM | POA: Diagnosis not present

## 2015-01-13 DIAGNOSIS — E119 Type 2 diabetes mellitus without complications: Secondary | ICD-10-CM | POA: Diagnosis not present

## 2015-01-13 DIAGNOSIS — N2581 Secondary hyperparathyroidism of renal origin: Secondary | ICD-10-CM | POA: Diagnosis not present

## 2015-01-13 DIAGNOSIS — N186 End stage renal disease: Secondary | ICD-10-CM | POA: Diagnosis not present

## 2015-01-13 DIAGNOSIS — D631 Anemia in chronic kidney disease: Secondary | ICD-10-CM | POA: Diagnosis not present

## 2015-01-15 DIAGNOSIS — D631 Anemia in chronic kidney disease: Secondary | ICD-10-CM | POA: Diagnosis not present

## 2015-01-15 DIAGNOSIS — N2581 Secondary hyperparathyroidism of renal origin: Secondary | ICD-10-CM | POA: Diagnosis not present

## 2015-01-15 DIAGNOSIS — E119 Type 2 diabetes mellitus without complications: Secondary | ICD-10-CM | POA: Diagnosis not present

## 2015-01-15 DIAGNOSIS — N186 End stage renal disease: Secondary | ICD-10-CM | POA: Diagnosis not present

## 2015-01-17 ENCOUNTER — Encounter: Payer: Self-pay | Admitting: Hematology

## 2015-01-17 ENCOUNTER — Ambulatory Visit (HOSPITAL_BASED_OUTPATIENT_CLINIC_OR_DEPARTMENT_OTHER): Payer: Medicare Other | Admitting: Hematology

## 2015-01-17 ENCOUNTER — Ambulatory Visit (HOSPITAL_BASED_OUTPATIENT_CLINIC_OR_DEPARTMENT_OTHER): Payer: Medicare Other

## 2015-01-17 VITALS — BP 128/82 | HR 98 | Temp 98.4°F | Resp 18 | Ht 69.0 in | Wt 180.8 lb

## 2015-01-17 DIAGNOSIS — Z8673 Personal history of transient ischemic attack (TIA), and cerebral infarction without residual deficits: Secondary | ICD-10-CM

## 2015-01-17 DIAGNOSIS — E119 Type 2 diabetes mellitus without complications: Secondary | ICD-10-CM

## 2015-01-17 DIAGNOSIS — R7989 Other specified abnormal findings of blood chemistry: Secondary | ICD-10-CM

## 2015-01-17 DIAGNOSIS — N186 End stage renal disease: Secondary | ICD-10-CM

## 2015-01-17 DIAGNOSIS — Z9289 Personal history of other medical treatment: Secondary | ICD-10-CM

## 2015-01-17 DIAGNOSIS — Z87891 Personal history of nicotine dependence: Secondary | ICD-10-CM | POA: Diagnosis not present

## 2015-01-17 DIAGNOSIS — R740 Nonspecific elevation of levels of transaminase and lactic acid dehydrogenase [LDH]: Secondary | ICD-10-CM

## 2015-01-17 DIAGNOSIS — D696 Thrombocytopenia, unspecified: Secondary | ICD-10-CM

## 2015-01-17 DIAGNOSIS — I10 Essential (primary) hypertension: Secondary | ICD-10-CM | POA: Diagnosis not present

## 2015-01-17 LAB — COMPREHENSIVE METABOLIC PANEL
ALT: 9 U/L (ref 0–55)
ANION GAP: 15 meq/L — AB (ref 3–11)
AST: 17 U/L (ref 5–34)
Albumin: 3.5 g/dL (ref 3.5–5.0)
Alkaline Phosphatase: 120 U/L (ref 40–150)
BUN: 46.9 mg/dL — ABNORMAL HIGH (ref 7.0–26.0)
CALCIUM: 10.2 mg/dL (ref 8.4–10.4)
CHLORIDE: 99 meq/L (ref 98–109)
CO2: 28 meq/L (ref 22–29)
Creatinine: 9.9 mg/dL (ref 0.7–1.3)
EGFR: 6 mL/min/{1.73_m2} — ABNORMAL LOW (ref >90)
Glucose: 96 mg/dl (ref 70–140)
POTASSIUM: 4.2 meq/L (ref 3.5–5.1)
SODIUM: 142 meq/L (ref 136–145)
Total Bilirubin: 0.84 mg/dL (ref 0.20–1.20)
Total Protein: 7.8 g/dL (ref 6.4–8.3)

## 2015-01-17 LAB — CBC & DIFF AND RETIC
BASO%: 1.2 % (ref 0.0–2.0)
BASOS ABS: 0 10*3/uL (ref 0.0–0.1)
EOS%: 13.2 % — AB (ref 0.0–7.0)
Eosinophils Absolute: 0.5 10*3/uL (ref 0.0–0.5)
HEMATOCRIT: 35.7 % — AB (ref 38.4–49.9)
HGB: 11.5 g/dL — ABNORMAL LOW (ref 13.0–17.1)
Immature Retic Fract: 3.8 % (ref 3.00–10.60)
LYMPH%: 21.7 % (ref 14.0–49.0)
MCH: 31.1 pg (ref 27.2–33.4)
MCHC: 32.2 g/dL (ref 32.0–36.0)
MCV: 96.5 fL (ref 79.3–98.0)
MONO#: 0.3 10*3/uL (ref 0.1–0.9)
MONO%: 7.3 % (ref 0.0–14.0)
NEUT#: 1.9 10*3/uL (ref 1.5–6.5)
NEUT%: 56.6 % (ref 39.0–75.0)
PLATELETS: 78 10*3/uL — AB (ref 140–400)
RBC: 3.7 10*6/uL — ABNORMAL LOW (ref 4.20–5.82)
RDW: 15.5 % — ABNORMAL HIGH (ref 11.0–14.6)
RETIC CT ABS: 27.01 10*3/uL — AB (ref 34.80–93.90)
Retic %: 0.73 % — ABNORMAL LOW (ref 0.80–1.80)
WBC: 3.4 10*3/uL — ABNORMAL LOW (ref 4.0–10.3)
lymph#: 0.7 10*3/uL — ABNORMAL LOW (ref 0.9–3.3)

## 2015-01-17 LAB — CHCC SMEAR

## 2015-01-17 LAB — LACTATE DEHYDROGENASE: LDH: 266 U/L — ABNORMAL HIGH (ref 125–245)

## 2015-01-17 NOTE — Progress Notes (Signed)
Marland Kitchen    HEMATOLOGY/ONCOLOGY CONSULTATION NOTE  Date of Service: 01/17/2015  Patient Care Team: Lorayne Marek, MD as PCP - General (Internal Medicine) Kathrynn Ducking, MD (Neurology) Corliss Parish, MD as Attending Physician (Nephrology) Ivin Poot, MD as Attending Physician (Cardiothoracic Surgery) Fleet Contras, MD as Consulting Physician (Nephrology)  CHIEF COMPLAINTS/PURPOSE OF CONSULTATION:  Thrombocytopenia and mild leukopenia  HISTORY OF PRESENTING ILLNESS:   Nathaniel White is a wonderful 55 y.o. male who has been referred to [place] by Dr Fleet Contras for evaluation and management of thrombocytopenia and mild leucopenia.  Patient has history of hypertension, left-sided hemiparesis due to old stroke, mod to severe mitral regurgitation, CHF with ejection fraction 25-30%, COPD, diabetes, end-stage renal disease on hemodialysis, memory issues due to stroke who was noted on labs or on 11/30/2014 to have mild leukopenia of 3.45k with borderline low hemoglobin of 12.1 and normal cytopenia with platelets of 65k. Outside labs show that his ferritin level was 1150. Labs prior to these on 11/05/2014 showed platelet counts of 60k and WBC count of 3.44k. Labs on 10/08/2014 showed platelet counts of 67k. A prolapsed disc with the patient has had some thrombocytopenia for several years and it has been somewhat more prominent since July 2015.  Labs today show platelets are somewhat better at 78k. Patient notes no significant overt bleeding at this time. Notes some increased bruising. Not very oriented to his medications to be able to say if he is on any new medications. No known history of liver disease. Patient had a CT of the chest without contrast in October 2015 which showed no overt hepatosplenomegaly. Does not report having received platelet transfusions in the past. No recent blood transfusions. Denies alcohol abuse at this time.  MEDICAL HISTORY:  Past Medical History    Diagnosis Date  . Hypertension   . Anemia   . Hemiparesis due to old stroke [facility]) About 10 yrs ago    left  . LV dysfunction     EF 25-30% by echo 07/2011  . MR (mitral regurgitation)     moderate to severe, echo 07/2011  . Shortness of breath   . CHF (congestive heart failure) ([place])   . COPD (chronic obstructive pulmonary disease) ([place])   . Tobacco abuse   . Asthma   . Diabetes mellitus without complication ([place])     not on medications  . Anxiety   . ESRD     on HD, M- W- F   . Memory loss due to medical condition     due to stroke  . Complication of anesthesia     according to pt and spouse pt was moving around and cough while under and pt had difficulty waking up so the anesthesia had to be reversed. and pt admitted to ICU.   Marland Kitchen Stroke [facility]) Expressive aphasia - mild issues persistent.    TIA's-left sided weakness    SURGICAL HISTORY: Past Surgical History  Procedure Laterality Date  . Kidney transplant      2011 rejected kidney 2012 back on dialysis  . Av fistula placement    . Cardiac catheterization      [place] without cardioversion  08/17/2011    Procedure: TRANSESOPHAGEAL ECHOCARDIOGRAM (TEE);  Surgeon: Larey Dresser, MD;  Location: [place];  Service: Cardiovascular;  Laterality: N/A;  . Revison of arteriovenous fistula Left 08/26/2013    Procedure: EXCISION OF ERODED SKIN AND EXPLORATION OF MAIN LEFT UPPER ARM AV FISTULA;  Surgeon:  Rosetta Posner, MD;  Location: [place];  Service: Vascular;  Laterality: Left;  . Video assisted thoracoscopy (vats)/decortication  08/10/11  . Fistula superficialization Left 11/10/2013    Procedure: FISTULA PLICATION;  Surgeon: Serafina Mitchell, MD;  Location: [place];  Service: Vascular;  Laterality: Left;  . Shuntogram N/A 11/05/2012    Procedure: Fistulogram;  Surgeon: Serafina Mitchell, MD;  Location[facility] LAB;  Service: Cardiovascular;  Laterality: N/A;  . Left and right heart catheterization with coronary angiogram  N/A 10/09/2013    Procedure: LEFT AND RIGHT HEART CATHETERIZATION WITH CORONARY ANGIOGRAM;  Surgeon: Burnell Blanks, MD;  Location: [facility] CATH LAB;  Service: Cardiovascular;  Laterality: N/A;  . Colonoscopy w/ biopsies and polypectomy    . Revison of arteriovenous fistula Left 02/05/2014    Procedure: REPAIR OF ARTERIOVENOUS FISTULA ANEURYSM;  Surgeon: Serafina Mitchell, MD;  Location: [place];  Service: Vascular;  Laterality: Left;    SOCIAL HISTORY: Social History   Social History  . Marital Status: Single    Spouse Name: N/A  . Number of Children: 1  . Years of Education: N/A   Occupational History  . Retired-[employer]    Social History Main Topics  . Smoking status: Former Smoker -- 0.50 packs/day for 32 years    Types: Cigarettes    Quit date: 10/13/2012  . Smokeless tobacco: Never Used  . Alcohol Use: No  . Drug Use: No  . Sexual Activity: Not on file   Other Topics Concern  . Not on file   Social History Narrative    FAMILY HISTORY: Family History  Problem Relation Age of Onset  . Hypertension Mother   . Varicose Veins Mother   . Diabetes Paternal Grandmother   . Cancer Paternal Grandfather   . CAD Paternal Uncle     ALLERGIES:  is allergic to codeine.  MEDICATIONS:  Current Outpatient Prescriptions  Medication Sig Dispense Refill  . albuterol (PROVENTIL HFA;VENTOLIN HFA) 108 (90 BASE) MCG/ACT inhaler Inhale 1-2 puffs into the lungs every 6 (six) hours as needed for wheezing. 1 Inhaler 0  . clonazePAM (KLONOPIN) 1 MG tablet 1 mg    . cloNIDine (CATAPRES-TTS-3) 0.3 mg/24hr patch WEEKLY    . Fluticasone-Salmeterol (ADVAIR) 250-50 MCG/DOSE AEPB Inhale 2 puffs into the lungs daily at 12 noon.    Marland Kitchen FOSRENOL 1000 MG chewable tablet CHEW & SWALLOW 2 TABLETS BY MOUTH WITH MEALS  12  . glucose blood (ONE TOUCH ULTRA TEST) test strip     . labetalol (NORMODYNE) 300 MG tablet Take 300 mg by mouth 2 (two) times daily.     . SENSIPAR 30 MG tablet     .  tadalafil (CIALIS) 5 MG tablet Take 5 mg by mouth daily as needed for erectile dysfunction.    . traMADol (ULTRAM) 50 MG tablet Take 1 tablet (50 mg total) by mouth every 6 (six) hours as needed. 20 tablet 0   No current facility-administered medications for this visit.    REVIEW OF SYSTEMS:    10 Point review of Systems was done is negative except as noted above.  PHYSICAL EXAMINATION: ECOG PERFORMANCE STATUS: 3 - Symptomatic, >50% confined to bed  . Filed Vitals:   01/17/15 1344  BP: 128/82  Pulse: 98  Temp: 98.4 F (36.9 C)  Resp: 18   Filed Weights   01/17/15 1344  Weight: 180 lb 12.8 oz (82.01 kg)   .Body mass index is 26.69 kg/(m^2).  GENERAL:alert, in  no acute distress and comfortable SKIN: No acute rashes  EYES: normal, conjunctiva are pink and non-injected, sclera clear OROPHARYNX:no exudate, no erythema and lips, buccal mucosa, and tongue normal  NECK: supple, no JVD, thyroid normal size, non-tender, without nodularity LYMPH:  no palpable lymphadenopathy in the cervical, axillary or inguinal LUNGS: clear to auscultation with normal respiratory effort HEART: regular rate & rhythm,  no murmurs and no lower extremity edema ABDOMEN: abdomen soft, non-tender, normoactive bowel sounds  Musculoskeletal: no cyanosis of digits and no clubbing  PSYCH: alert & oriented x 3 with fluent speech NEURO: no focal motor/sensory deficits  LABORATORY DATA:  I have reviewed the data as listed  . CBC Latest Ref Rng 02/05/2014 11/10/2013 11/10/2013  WBC 4.0 - 10.5 K/uL - 4.4 -  Hemoglobin 13.0 - 17.0 g/dL 11.9(L) 9.5(L) 12.2(L)  Hematocrit 39.0 - 52.0 % 35.0(L) 29.9(L) 36.0(L)  Platelets 150 - 400 K/uL - 87(L) -    . CMP Latest Ref Rng 02/05/2014 11/10/2013 11/10/2013  Glucose 70 - 99 mg/dL 96 78 89  BUN 6 - 23 mg/dL - 46(H) -  Creatinine 0.50 - 1.35 mg/dL - 9.02(H) -  Sodium 135 - 145 mmol/L 139 145 140  Potassium 3.5 - 5.1 mmol/L 5.0 4.7 4.7  Chloride 96 - 112 mEq/L - 103 -   CO2 19 - 32 mEq/L - 23 -  Calcium 8.4 - 10.5 mg/dL - 9.3 -  Total Protein 6.0 - 8.3 g/dL - - -  Total Bilirubin 0.3 - 1.2 mg/dL - - -  Alkaline Phos 39 - 117 U/L - - -  AST 0 - 37 U/L - - -  ALT 0 - 53 U/L - - -   Component     Latest Ref Rng 01/17/2015  WBC     4.0 - 10.3 10e3/uL 3.4 (L)  NEUT#     1.5 - 6.5 10e3/uL 1.9  Hemoglobin     13.0 - 17.1 g/dL 11.5 (L)  HCT     38.4 - 49.9 % 35.7 (L)  Platelets     140 - 400 10e3/uL 78 (L)  MCV     79.3 - 98.0 fL 96.5  MCH     27.2 - 33.4 pg 31.1  MCHC     32.0 - 36.0 g/dL 32.2  RBC     4.20 - 5.82 10e6/uL 3.70 (L)  RDW     11.0 - 14.6 % 15.5 (H)  lymph#     0.9 - 3.3 10e3/uL 0.7 (L)  MONO#     0.1 - 0.9 10e3/uL 0.3  Eosinophils Absolute     0.0 - 0.5 10e3/uL 0.5  Basophils Absolute     0.0 - 0.1 10e3/uL 0.0  NEUT%     39.0 - 75.0 % 56.6  LYMPH%     14.0 - 49.0 % 21.7  MONO%     0.0 - 14.0 % 7.3  EOS%     0.0 - 7.0 % 13.2 (H)  BASO%     0.0 - 2.0 % 1.2  Retic %     0.80 - 1.80 % 0.73 (L)  Retic Ct Abs     34.80 - 93.90 10e3/uL 27.01 (L)  Immature Retic Fract     3.00 - 10.60 % 3.80  Sodium     136 - 145 mEq/L 142  Potassium     3.5 - 5.1 mEq/L 4.2  Chloride     98 - 109 mEq/L 99  CO2  22 - 29 mEq/L 28  Glucose     70 - 140 mg/dl 96  BUN     7.0 - 26.0 mg/dL 46.9 (H)  Creatinine     0.7 - 1.3 mg/dL 9.9 (HH)  Total Bilirubin     0.20 - 1.20 mg/dL 0.84  Alkaline Phosphatase     40 - 150 U/L 120  AST     5 - 34 U/L 17  ALT     0 - 55 U/L 9  Total Protein     6.4 - 8.3 g/dL 7.8  Albumin     3.5 - 5.0 g/dL 3.5  Calcium     8.4 - 10.4 mg/dL 10.2  Anion gap     3 - 11 mEq/L 15 (H)  EGFR     >90 ml/min/1.73 m2 6 (L)  LDH     125 - 245 U/L 266 (H)  Hep C Virus Ab     0.0 - 0.9 s/co ratio <0.1  Sed Rate     0 - 30 mm/hr 29  CRP     0.0 - 4.9 mg/L 12.0 (H)  Vitamin B12     211 - 946 pg/mL 744  Haptoglobin     34 - 200 mg/dL 105    RADIOGRAPHIC STUDIES: I have personally reviewed the  radiological images as listed and agreed with the findings in the report. No results found.  ASSESSMENT & PLAN:   55 year old unfortunate gentleman with multiple medical comorbidities including hypertension, diabetes, end-stage renal disease on dialysis, old CVA with memory issues and left-sided weakness with  #1 chronic thrombocytopenia in the range of 60-100k since 2015. No issues with overt bleeding at this time Likely due to bystander thrombocytopenia from moderate to severe mitral regurgitation vs ITP Peripheral blood smear shows minimal platelet clumping which would not explain all the thrombocytopenia. Hep C negative. No alcohol issues. Borderline elevated LDH but normal haptoglobin suggesting against significant hemolysis. Additional factor- intermittent heparin exposure with dialysis.  #2 elevated LDH with normal haptoglobin - does not suggest overt hemolysis with stable levels. No overt peripheral lymphadenopathy to suggest overt lymphoma. We'll repeat on follow-up. Elevated CRP likely from chronic inflammation due to end-stage renal disease.  #3 elevated ferritin levels-to be partly from iron overload and partially from inflammation.  #4 multiple other comorbidities Plan -No indication for treatment of the thrombocytopenia at this time. -No indication or platelet transfusions. -Avoid medications that can cause thrombocytopenia. -If the platelets drop below 30,000 or if the patient develops bleeding issues would consider the use of Romiplostim -Continue monitoring platelets with dialysis. -We will recheck labs in 3-4 months. -Could consider ultrasound of the abdomen to evaluate spleen size and liver. -Continue follow-up with nephrology and primary care physician for management of other multiple medical co-morbidities.   Return to care with Dr. Irene Limbo in 3-4 months with repeat CBC, CMP, LDH. Earlier if any new concerns   . Orders Placed This Encounter  Procedures  . CBC &  Diff and Retic    Standing Status: Future     Number of Occurrences: 1     Standing Expiration Date: 02/21/2016  . Comprehensive metabolic panel    Standing Status: Future     Number of Occurrences: 1     Standing Expiration Date: 01/17/2016  . Smear    Standing Status: Future     Number of Occurrences: 1     Standing Expiration Date: 01/17/2016  . Hit Panel With Serotonin Release  Assay    Standing Status: Future     Number of Occurrences: 1     Standing Expiration Date: 01/17/2016  . Lactate Dehydrogenase    Standing Status: Future     Number of Occurrences: 1     Standing Expiration Date: 01/17/2016  . Hepatitis C Antibody    Standing Status: Future     Number of Occurrences: 1     Standing Expiration Date: 01/17/2016  . Sedimentation rate    Standing Status: Future     Number of Occurrences: 1     Standing Expiration Date: 01/17/2016  . C-reactive protein    Standing Status: Future     Number of Occurrences: 1     Standing Expiration Date: 01/17/2016  . Vitamin B12    Standing Status: Future     Number of Occurrences: 1     Standing Expiration Date: 01/17/2016  . Haptoglobin    Standing Status: Future     Number of Occurrences: 1     Standing Expiration Date: 01/17/2016    All of the patients questions were answered with apparent satisfaction. The patient knows to call the clinic with any problems, questions or concerns.  I spent 60 minutes counseling the patient face to face. The total time spent in the appointment was 60 minutes and more than 50% was on counseling and direct patient cares.    Sullivan Lone MD [place] AAHIVMS [facility] Hematology/Oncology Physician [facility]  (Office):       [phone] (Work cell):  [phone] (Fax):           [phone]  01/17/2015 1:55 PM

## 2015-01-18 DIAGNOSIS — E119 Type 2 diabetes mellitus without complications: Secondary | ICD-10-CM | POA: Diagnosis not present

## 2015-01-18 DIAGNOSIS — D631 Anemia in chronic kidney disease: Secondary | ICD-10-CM | POA: Diagnosis not present

## 2015-01-18 DIAGNOSIS — N2581 Secondary hyperparathyroidism of renal origin: Secondary | ICD-10-CM | POA: Diagnosis not present

## 2015-01-18 DIAGNOSIS — N186 End stage renal disease: Secondary | ICD-10-CM | POA: Diagnosis not present

## 2015-01-18 LAB — C-REACTIVE PROTEIN: CRP: 12 mg/L — ABNORMAL HIGH (ref 0.0–4.9)

## 2015-01-18 LAB — HEPATITIS C ANTIBODY: Hep C Virus Ab: <0.1 s/co ratio (ref 0.0–0.9)

## 2015-01-18 LAB — SEDIMENTATION RATE: Sedimentation Rate-Westergren: 29 mm/hr (ref 0–30)

## 2015-01-18 LAB — VITAMIN B12: VITAMIN B 12: 744 pg/mL (ref 211–946)

## 2015-01-18 LAB — HAPTOGLOBIN: Haptoglobin: 105 mg/dL (ref 34–200)

## 2015-01-20 DIAGNOSIS — D631 Anemia in chronic kidney disease: Secondary | ICD-10-CM | POA: Diagnosis not present

## 2015-01-20 DIAGNOSIS — N186 End stage renal disease: Secondary | ICD-10-CM | POA: Diagnosis not present

## 2015-01-20 DIAGNOSIS — N2581 Secondary hyperparathyroidism of renal origin: Secondary | ICD-10-CM | POA: Diagnosis not present

## 2015-01-20 DIAGNOSIS — E119 Type 2 diabetes mellitus without complications: Secondary | ICD-10-CM | POA: Diagnosis not present

## 2015-01-22 DIAGNOSIS — E119 Type 2 diabetes mellitus without complications: Secondary | ICD-10-CM | POA: Diagnosis not present

## 2015-01-22 DIAGNOSIS — D631 Anemia in chronic kidney disease: Secondary | ICD-10-CM | POA: Diagnosis not present

## 2015-01-22 DIAGNOSIS — N186 End stage renal disease: Secondary | ICD-10-CM | POA: Diagnosis not present

## 2015-01-22 DIAGNOSIS — N2581 Secondary hyperparathyroidism of renal origin: Secondary | ICD-10-CM | POA: Diagnosis not present

## 2015-01-25 DIAGNOSIS — E119 Type 2 diabetes mellitus without complications: Secondary | ICD-10-CM | POA: Diagnosis not present

## 2015-01-25 DIAGNOSIS — D631 Anemia in chronic kidney disease: Secondary | ICD-10-CM | POA: Diagnosis not present

## 2015-01-25 DIAGNOSIS — N2581 Secondary hyperparathyroidism of renal origin: Secondary | ICD-10-CM | POA: Diagnosis not present

## 2015-01-25 DIAGNOSIS — N186 End stage renal disease: Secondary | ICD-10-CM | POA: Diagnosis not present

## 2015-01-27 DIAGNOSIS — E119 Type 2 diabetes mellitus without complications: Secondary | ICD-10-CM | POA: Diagnosis not present

## 2015-01-27 DIAGNOSIS — N2581 Secondary hyperparathyroidism of renal origin: Secondary | ICD-10-CM | POA: Diagnosis not present

## 2015-01-27 DIAGNOSIS — N186 End stage renal disease: Secondary | ICD-10-CM | POA: Diagnosis not present

## 2015-01-27 DIAGNOSIS — D631 Anemia in chronic kidney disease: Secondary | ICD-10-CM | POA: Diagnosis not present

## 2015-01-29 DIAGNOSIS — D631 Anemia in chronic kidney disease: Secondary | ICD-10-CM | POA: Diagnosis not present

## 2015-01-29 DIAGNOSIS — E119 Type 2 diabetes mellitus without complications: Secondary | ICD-10-CM | POA: Diagnosis not present

## 2015-01-29 DIAGNOSIS — N186 End stage renal disease: Secondary | ICD-10-CM | POA: Diagnosis not present

## 2015-01-29 DIAGNOSIS — N2581 Secondary hyperparathyroidism of renal origin: Secondary | ICD-10-CM | POA: Diagnosis not present

## 2015-02-01 DIAGNOSIS — N2581 Secondary hyperparathyroidism of renal origin: Secondary | ICD-10-CM | POA: Diagnosis not present

## 2015-02-01 DIAGNOSIS — D631 Anemia in chronic kidney disease: Secondary | ICD-10-CM | POA: Diagnosis not present

## 2015-02-01 DIAGNOSIS — E119 Type 2 diabetes mellitus without complications: Secondary | ICD-10-CM | POA: Diagnosis not present

## 2015-02-01 DIAGNOSIS — N186 End stage renal disease: Secondary | ICD-10-CM | POA: Diagnosis not present

## 2015-02-03 DIAGNOSIS — N2581 Secondary hyperparathyroidism of renal origin: Secondary | ICD-10-CM | POA: Diagnosis not present

## 2015-02-03 DIAGNOSIS — E119 Type 2 diabetes mellitus without complications: Secondary | ICD-10-CM | POA: Diagnosis not present

## 2015-02-03 DIAGNOSIS — D631 Anemia in chronic kidney disease: Secondary | ICD-10-CM | POA: Diagnosis not present

## 2015-02-03 DIAGNOSIS — N186 End stage renal disease: Secondary | ICD-10-CM | POA: Diagnosis not present

## 2015-02-05 DIAGNOSIS — D631 Anemia in chronic kidney disease: Secondary | ICD-10-CM | POA: Diagnosis not present

## 2015-02-05 DIAGNOSIS — E119 Type 2 diabetes mellitus without complications: Secondary | ICD-10-CM | POA: Diagnosis not present

## 2015-02-05 DIAGNOSIS — N186 End stage renal disease: Secondary | ICD-10-CM | POA: Diagnosis not present

## 2015-02-05 DIAGNOSIS — N2581 Secondary hyperparathyroidism of renal origin: Secondary | ICD-10-CM | POA: Diagnosis not present

## 2015-02-08 DIAGNOSIS — N2581 Secondary hyperparathyroidism of renal origin: Secondary | ICD-10-CM | POA: Diagnosis not present

## 2015-02-08 DIAGNOSIS — T8612 Kidney transplant failure: Secondary | ICD-10-CM | POA: Diagnosis not present

## 2015-02-08 DIAGNOSIS — Z992 Dependence on renal dialysis: Secondary | ICD-10-CM | POA: Diagnosis not present

## 2015-02-08 DIAGNOSIS — E119 Type 2 diabetes mellitus without complications: Secondary | ICD-10-CM | POA: Diagnosis not present

## 2015-02-08 DIAGNOSIS — N186 End stage renal disease: Secondary | ICD-10-CM | POA: Diagnosis not present

## 2015-02-08 DIAGNOSIS — D631 Anemia in chronic kidney disease: Secondary | ICD-10-CM | POA: Diagnosis not present

## 2015-02-09 ENCOUNTER — Encounter: Payer: Self-pay | Admitting: Gastroenterology

## 2015-02-10 DIAGNOSIS — E119 Type 2 diabetes mellitus without complications: Secondary | ICD-10-CM | POA: Diagnosis not present

## 2015-02-10 DIAGNOSIS — N186 End stage renal disease: Secondary | ICD-10-CM | POA: Diagnosis not present

## 2015-02-10 DIAGNOSIS — N2581 Secondary hyperparathyroidism of renal origin: Secondary | ICD-10-CM | POA: Diagnosis not present

## 2015-02-10 DIAGNOSIS — E8770 Fluid overload, unspecified: Secondary | ICD-10-CM | POA: Diagnosis not present

## 2015-02-10 DIAGNOSIS — D631 Anemia in chronic kidney disease: Secondary | ICD-10-CM | POA: Diagnosis not present

## 2015-02-10 DIAGNOSIS — D509 Iron deficiency anemia, unspecified: Secondary | ICD-10-CM | POA: Diagnosis not present

## 2015-02-12 DIAGNOSIS — N186 End stage renal disease: Secondary | ICD-10-CM | POA: Diagnosis not present

## 2015-02-12 DIAGNOSIS — E8770 Fluid overload, unspecified: Secondary | ICD-10-CM | POA: Diagnosis not present

## 2015-02-12 DIAGNOSIS — D509 Iron deficiency anemia, unspecified: Secondary | ICD-10-CM | POA: Diagnosis not present

## 2015-02-12 DIAGNOSIS — D631 Anemia in chronic kidney disease: Secondary | ICD-10-CM | POA: Diagnosis not present

## 2015-02-12 DIAGNOSIS — N2581 Secondary hyperparathyroidism of renal origin: Secondary | ICD-10-CM | POA: Diagnosis not present

## 2015-02-12 DIAGNOSIS — E119 Type 2 diabetes mellitus without complications: Secondary | ICD-10-CM | POA: Diagnosis not present

## 2015-02-15 DIAGNOSIS — N186 End stage renal disease: Secondary | ICD-10-CM | POA: Diagnosis not present

## 2015-02-15 DIAGNOSIS — N2581 Secondary hyperparathyroidism of renal origin: Secondary | ICD-10-CM | POA: Diagnosis not present

## 2015-02-15 DIAGNOSIS — E119 Type 2 diabetes mellitus without complications: Secondary | ICD-10-CM | POA: Diagnosis not present

## 2015-02-15 DIAGNOSIS — D631 Anemia in chronic kidney disease: Secondary | ICD-10-CM | POA: Diagnosis not present

## 2015-02-15 DIAGNOSIS — D509 Iron deficiency anemia, unspecified: Secondary | ICD-10-CM | POA: Diagnosis not present

## 2015-02-15 DIAGNOSIS — E8770 Fluid overload, unspecified: Secondary | ICD-10-CM | POA: Diagnosis not present

## 2015-02-17 DIAGNOSIS — E8770 Fluid overload, unspecified: Secondary | ICD-10-CM | POA: Diagnosis not present

## 2015-02-17 DIAGNOSIS — N2581 Secondary hyperparathyroidism of renal origin: Secondary | ICD-10-CM | POA: Diagnosis not present

## 2015-02-17 DIAGNOSIS — N186 End stage renal disease: Secondary | ICD-10-CM | POA: Diagnosis not present

## 2015-02-17 DIAGNOSIS — E119 Type 2 diabetes mellitus without complications: Secondary | ICD-10-CM | POA: Diagnosis not present

## 2015-02-17 DIAGNOSIS — D509 Iron deficiency anemia, unspecified: Secondary | ICD-10-CM | POA: Diagnosis not present

## 2015-02-17 DIAGNOSIS — D631 Anemia in chronic kidney disease: Secondary | ICD-10-CM | POA: Diagnosis not present

## 2015-02-19 DIAGNOSIS — D631 Anemia in chronic kidney disease: Secondary | ICD-10-CM | POA: Diagnosis not present

## 2015-02-19 DIAGNOSIS — D509 Iron deficiency anemia, unspecified: Secondary | ICD-10-CM | POA: Diagnosis not present

## 2015-02-19 DIAGNOSIS — E119 Type 2 diabetes mellitus without complications: Secondary | ICD-10-CM | POA: Diagnosis not present

## 2015-02-19 DIAGNOSIS — N186 End stage renal disease: Secondary | ICD-10-CM | POA: Diagnosis not present

## 2015-02-19 DIAGNOSIS — E8770 Fluid overload, unspecified: Secondary | ICD-10-CM | POA: Diagnosis not present

## 2015-02-19 DIAGNOSIS — N2581 Secondary hyperparathyroidism of renal origin: Secondary | ICD-10-CM | POA: Diagnosis not present

## 2015-02-22 DIAGNOSIS — D631 Anemia in chronic kidney disease: Secondary | ICD-10-CM | POA: Diagnosis not present

## 2015-02-22 DIAGNOSIS — N2581 Secondary hyperparathyroidism of renal origin: Secondary | ICD-10-CM | POA: Diagnosis not present

## 2015-02-22 DIAGNOSIS — E119 Type 2 diabetes mellitus without complications: Secondary | ICD-10-CM | POA: Diagnosis not present

## 2015-02-22 DIAGNOSIS — E8770 Fluid overload, unspecified: Secondary | ICD-10-CM | POA: Diagnosis not present

## 2015-02-22 DIAGNOSIS — D509 Iron deficiency anemia, unspecified: Secondary | ICD-10-CM | POA: Diagnosis not present

## 2015-02-22 DIAGNOSIS — N186 End stage renal disease: Secondary | ICD-10-CM | POA: Diagnosis not present

## 2015-02-24 DIAGNOSIS — D509 Iron deficiency anemia, unspecified: Secondary | ICD-10-CM | POA: Diagnosis not present

## 2015-02-24 DIAGNOSIS — E119 Type 2 diabetes mellitus without complications: Secondary | ICD-10-CM | POA: Diagnosis not present

## 2015-02-24 DIAGNOSIS — N186 End stage renal disease: Secondary | ICD-10-CM | POA: Diagnosis not present

## 2015-02-24 DIAGNOSIS — E8770 Fluid overload, unspecified: Secondary | ICD-10-CM | POA: Diagnosis not present

## 2015-02-24 DIAGNOSIS — D631 Anemia in chronic kidney disease: Secondary | ICD-10-CM | POA: Diagnosis not present

## 2015-02-24 DIAGNOSIS — N2581 Secondary hyperparathyroidism of renal origin: Secondary | ICD-10-CM | POA: Diagnosis not present

## 2015-02-26 DIAGNOSIS — N186 End stage renal disease: Secondary | ICD-10-CM | POA: Diagnosis not present

## 2015-02-26 DIAGNOSIS — E119 Type 2 diabetes mellitus without complications: Secondary | ICD-10-CM | POA: Diagnosis not present

## 2015-02-26 DIAGNOSIS — E8770 Fluid overload, unspecified: Secondary | ICD-10-CM | POA: Diagnosis not present

## 2015-02-26 DIAGNOSIS — N2581 Secondary hyperparathyroidism of renal origin: Secondary | ICD-10-CM | POA: Diagnosis not present

## 2015-02-26 DIAGNOSIS — D509 Iron deficiency anemia, unspecified: Secondary | ICD-10-CM | POA: Diagnosis not present

## 2015-02-26 DIAGNOSIS — D631 Anemia in chronic kidney disease: Secondary | ICD-10-CM | POA: Diagnosis not present

## 2015-03-01 DIAGNOSIS — D631 Anemia in chronic kidney disease: Secondary | ICD-10-CM | POA: Diagnosis not present

## 2015-03-01 DIAGNOSIS — E119 Type 2 diabetes mellitus without complications: Secondary | ICD-10-CM | POA: Diagnosis not present

## 2015-03-01 DIAGNOSIS — N2581 Secondary hyperparathyroidism of renal origin: Secondary | ICD-10-CM | POA: Diagnosis not present

## 2015-03-01 DIAGNOSIS — D509 Iron deficiency anemia, unspecified: Secondary | ICD-10-CM | POA: Diagnosis not present

## 2015-03-01 DIAGNOSIS — E8770 Fluid overload, unspecified: Secondary | ICD-10-CM | POA: Diagnosis not present

## 2015-03-01 DIAGNOSIS — N186 End stage renal disease: Secondary | ICD-10-CM | POA: Diagnosis not present

## 2015-03-02 DIAGNOSIS — D509 Iron deficiency anemia, unspecified: Secondary | ICD-10-CM | POA: Diagnosis not present

## 2015-03-02 DIAGNOSIS — E8770 Fluid overload, unspecified: Secondary | ICD-10-CM | POA: Diagnosis not present

## 2015-03-02 DIAGNOSIS — D631 Anemia in chronic kidney disease: Secondary | ICD-10-CM | POA: Diagnosis not present

## 2015-03-02 DIAGNOSIS — N2581 Secondary hyperparathyroidism of renal origin: Secondary | ICD-10-CM | POA: Diagnosis not present

## 2015-03-02 DIAGNOSIS — E119 Type 2 diabetes mellitus without complications: Secondary | ICD-10-CM | POA: Diagnosis not present

## 2015-03-02 DIAGNOSIS — N186 End stage renal disease: Secondary | ICD-10-CM | POA: Diagnosis not present

## 2015-03-05 DIAGNOSIS — E8770 Fluid overload, unspecified: Secondary | ICD-10-CM | POA: Diagnosis not present

## 2015-03-05 DIAGNOSIS — D631 Anemia in chronic kidney disease: Secondary | ICD-10-CM | POA: Diagnosis not present

## 2015-03-05 DIAGNOSIS — N186 End stage renal disease: Secondary | ICD-10-CM | POA: Diagnosis not present

## 2015-03-05 DIAGNOSIS — N2581 Secondary hyperparathyroidism of renal origin: Secondary | ICD-10-CM | POA: Diagnosis not present

## 2015-03-05 DIAGNOSIS — E119 Type 2 diabetes mellitus without complications: Secondary | ICD-10-CM | POA: Diagnosis not present

## 2015-03-05 DIAGNOSIS — D509 Iron deficiency anemia, unspecified: Secondary | ICD-10-CM | POA: Diagnosis not present

## 2015-03-08 DIAGNOSIS — D509 Iron deficiency anemia, unspecified: Secondary | ICD-10-CM | POA: Diagnosis not present

## 2015-03-08 DIAGNOSIS — E119 Type 2 diabetes mellitus without complications: Secondary | ICD-10-CM | POA: Diagnosis not present

## 2015-03-08 DIAGNOSIS — Z992 Dependence on renal dialysis: Secondary | ICD-10-CM | POA: Diagnosis not present

## 2015-03-08 DIAGNOSIS — E8770 Fluid overload, unspecified: Secondary | ICD-10-CM | POA: Diagnosis not present

## 2015-03-08 DIAGNOSIS — D631 Anemia in chronic kidney disease: Secondary | ICD-10-CM | POA: Diagnosis not present

## 2015-03-08 DIAGNOSIS — T8612 Kidney transplant failure: Secondary | ICD-10-CM | POA: Diagnosis not present

## 2015-03-08 DIAGNOSIS — N186 End stage renal disease: Secondary | ICD-10-CM | POA: Diagnosis not present

## 2015-03-08 DIAGNOSIS — N2581 Secondary hyperparathyroidism of renal origin: Secondary | ICD-10-CM | POA: Diagnosis not present

## 2015-03-10 DIAGNOSIS — D509 Iron deficiency anemia, unspecified: Secondary | ICD-10-CM | POA: Diagnosis not present

## 2015-03-10 DIAGNOSIS — N2581 Secondary hyperparathyroidism of renal origin: Secondary | ICD-10-CM | POA: Diagnosis not present

## 2015-03-10 DIAGNOSIS — N186 End stage renal disease: Secondary | ICD-10-CM | POA: Diagnosis not present

## 2015-03-10 DIAGNOSIS — E119 Type 2 diabetes mellitus without complications: Secondary | ICD-10-CM | POA: Diagnosis not present

## 2015-03-12 DIAGNOSIS — N186 End stage renal disease: Secondary | ICD-10-CM | POA: Diagnosis not present

## 2015-03-12 DIAGNOSIS — E119 Type 2 diabetes mellitus without complications: Secondary | ICD-10-CM | POA: Diagnosis not present

## 2015-03-12 DIAGNOSIS — D509 Iron deficiency anemia, unspecified: Secondary | ICD-10-CM | POA: Diagnosis not present

## 2015-03-12 DIAGNOSIS — N2581 Secondary hyperparathyroidism of renal origin: Secondary | ICD-10-CM | POA: Diagnosis not present

## 2015-03-15 DIAGNOSIS — E119 Type 2 diabetes mellitus without complications: Secondary | ICD-10-CM | POA: Diagnosis not present

## 2015-03-15 DIAGNOSIS — N2581 Secondary hyperparathyroidism of renal origin: Secondary | ICD-10-CM | POA: Diagnosis not present

## 2015-03-15 DIAGNOSIS — N186 End stage renal disease: Secondary | ICD-10-CM | POA: Diagnosis not present

## 2015-03-15 DIAGNOSIS — D509 Iron deficiency anemia, unspecified: Secondary | ICD-10-CM | POA: Diagnosis not present

## 2015-03-17 DIAGNOSIS — N186 End stage renal disease: Secondary | ICD-10-CM | POA: Diagnosis not present

## 2015-03-17 DIAGNOSIS — E119 Type 2 diabetes mellitus without complications: Secondary | ICD-10-CM | POA: Diagnosis not present

## 2015-03-17 DIAGNOSIS — N2581 Secondary hyperparathyroidism of renal origin: Secondary | ICD-10-CM | POA: Diagnosis not present

## 2015-03-17 DIAGNOSIS — D509 Iron deficiency anemia, unspecified: Secondary | ICD-10-CM | POA: Diagnosis not present

## 2015-03-19 DIAGNOSIS — D509 Iron deficiency anemia, unspecified: Secondary | ICD-10-CM | POA: Diagnosis not present

## 2015-03-19 DIAGNOSIS — N186 End stage renal disease: Secondary | ICD-10-CM | POA: Diagnosis not present

## 2015-03-19 DIAGNOSIS — E119 Type 2 diabetes mellitus without complications: Secondary | ICD-10-CM | POA: Diagnosis not present

## 2015-03-19 DIAGNOSIS — N2581 Secondary hyperparathyroidism of renal origin: Secondary | ICD-10-CM | POA: Diagnosis not present

## 2015-03-22 DIAGNOSIS — N186 End stage renal disease: Secondary | ICD-10-CM | POA: Diagnosis not present

## 2015-03-22 DIAGNOSIS — E119 Type 2 diabetes mellitus without complications: Secondary | ICD-10-CM | POA: Diagnosis not present

## 2015-03-22 DIAGNOSIS — D509 Iron deficiency anemia, unspecified: Secondary | ICD-10-CM | POA: Diagnosis not present

## 2015-03-22 DIAGNOSIS — N2581 Secondary hyperparathyroidism of renal origin: Secondary | ICD-10-CM | POA: Diagnosis not present

## 2015-03-24 DIAGNOSIS — N186 End stage renal disease: Secondary | ICD-10-CM | POA: Diagnosis not present

## 2015-03-24 DIAGNOSIS — E119 Type 2 diabetes mellitus without complications: Secondary | ICD-10-CM | POA: Diagnosis not present

## 2015-03-24 DIAGNOSIS — D509 Iron deficiency anemia, unspecified: Secondary | ICD-10-CM | POA: Diagnosis not present

## 2015-03-24 DIAGNOSIS — N2581 Secondary hyperparathyroidism of renal origin: Secondary | ICD-10-CM | POA: Diagnosis not present

## 2015-03-26 DIAGNOSIS — E119 Type 2 diabetes mellitus without complications: Secondary | ICD-10-CM | POA: Diagnosis not present

## 2015-03-26 DIAGNOSIS — N2581 Secondary hyperparathyroidism of renal origin: Secondary | ICD-10-CM | POA: Diagnosis not present

## 2015-03-26 DIAGNOSIS — N186 End stage renal disease: Secondary | ICD-10-CM | POA: Diagnosis not present

## 2015-03-26 DIAGNOSIS — D509 Iron deficiency anemia, unspecified: Secondary | ICD-10-CM | POA: Diagnosis not present

## 2015-03-29 DIAGNOSIS — N186 End stage renal disease: Secondary | ICD-10-CM | POA: Diagnosis not present

## 2015-03-29 DIAGNOSIS — N2581 Secondary hyperparathyroidism of renal origin: Secondary | ICD-10-CM | POA: Diagnosis not present

## 2015-03-29 DIAGNOSIS — D509 Iron deficiency anemia, unspecified: Secondary | ICD-10-CM | POA: Diagnosis not present

## 2015-03-29 DIAGNOSIS — E119 Type 2 diabetes mellitus without complications: Secondary | ICD-10-CM | POA: Diagnosis not present

## 2015-03-31 DIAGNOSIS — D509 Iron deficiency anemia, unspecified: Secondary | ICD-10-CM | POA: Diagnosis not present

## 2015-03-31 DIAGNOSIS — N2581 Secondary hyperparathyroidism of renal origin: Secondary | ICD-10-CM | POA: Diagnosis not present

## 2015-03-31 DIAGNOSIS — E119 Type 2 diabetes mellitus without complications: Secondary | ICD-10-CM | POA: Diagnosis not present

## 2015-03-31 DIAGNOSIS — N186 End stage renal disease: Secondary | ICD-10-CM | POA: Diagnosis not present

## 2015-04-02 DIAGNOSIS — D509 Iron deficiency anemia, unspecified: Secondary | ICD-10-CM | POA: Diagnosis not present

## 2015-04-02 DIAGNOSIS — N2581 Secondary hyperparathyroidism of renal origin: Secondary | ICD-10-CM | POA: Diagnosis not present

## 2015-04-02 DIAGNOSIS — N186 End stage renal disease: Secondary | ICD-10-CM | POA: Diagnosis not present

## 2015-04-02 DIAGNOSIS — E119 Type 2 diabetes mellitus without complications: Secondary | ICD-10-CM | POA: Diagnosis not present

## 2015-04-05 DIAGNOSIS — N186 End stage renal disease: Secondary | ICD-10-CM | POA: Diagnosis not present

## 2015-04-05 DIAGNOSIS — D509 Iron deficiency anemia, unspecified: Secondary | ICD-10-CM | POA: Diagnosis not present

## 2015-04-05 DIAGNOSIS — E119 Type 2 diabetes mellitus without complications: Secondary | ICD-10-CM | POA: Diagnosis not present

## 2015-04-05 DIAGNOSIS — N2581 Secondary hyperparathyroidism of renal origin: Secondary | ICD-10-CM | POA: Diagnosis not present

## 2015-04-07 DIAGNOSIS — N186 End stage renal disease: Secondary | ICD-10-CM | POA: Diagnosis not present

## 2015-04-07 DIAGNOSIS — N2581 Secondary hyperparathyroidism of renal origin: Secondary | ICD-10-CM | POA: Diagnosis not present

## 2015-04-07 DIAGNOSIS — E119 Type 2 diabetes mellitus without complications: Secondary | ICD-10-CM | POA: Diagnosis not present

## 2015-04-07 DIAGNOSIS — D509 Iron deficiency anemia, unspecified: Secondary | ICD-10-CM | POA: Diagnosis not present

## 2015-04-08 DIAGNOSIS — T8612 Kidney transplant failure: Secondary | ICD-10-CM | POA: Diagnosis not present

## 2015-04-08 DIAGNOSIS — N186 End stage renal disease: Secondary | ICD-10-CM | POA: Diagnosis not present

## 2015-04-08 DIAGNOSIS — Z992 Dependence on renal dialysis: Secondary | ICD-10-CM | POA: Diagnosis not present

## 2015-04-09 DIAGNOSIS — D631 Anemia in chronic kidney disease: Secondary | ICD-10-CM | POA: Diagnosis not present

## 2015-04-09 DIAGNOSIS — D509 Iron deficiency anemia, unspecified: Secondary | ICD-10-CM | POA: Diagnosis not present

## 2015-04-09 DIAGNOSIS — N2581 Secondary hyperparathyroidism of renal origin: Secondary | ICD-10-CM | POA: Diagnosis not present

## 2015-04-09 DIAGNOSIS — N186 End stage renal disease: Secondary | ICD-10-CM | POA: Diagnosis not present

## 2015-04-12 DIAGNOSIS — N186 End stage renal disease: Secondary | ICD-10-CM | POA: Diagnosis not present

## 2015-04-12 DIAGNOSIS — D631 Anemia in chronic kidney disease: Secondary | ICD-10-CM | POA: Diagnosis not present

## 2015-04-12 DIAGNOSIS — D509 Iron deficiency anemia, unspecified: Secondary | ICD-10-CM | POA: Diagnosis not present

## 2015-04-12 DIAGNOSIS — N2581 Secondary hyperparathyroidism of renal origin: Secondary | ICD-10-CM | POA: Diagnosis not present

## 2015-04-14 DIAGNOSIS — D631 Anemia in chronic kidney disease: Secondary | ICD-10-CM | POA: Diagnosis not present

## 2015-04-14 DIAGNOSIS — D509 Iron deficiency anemia, unspecified: Secondary | ICD-10-CM | POA: Diagnosis not present

## 2015-04-14 DIAGNOSIS — N2581 Secondary hyperparathyroidism of renal origin: Secondary | ICD-10-CM | POA: Diagnosis not present

## 2015-04-14 DIAGNOSIS — N186 End stage renal disease: Secondary | ICD-10-CM | POA: Diagnosis not present

## 2015-04-16 DIAGNOSIS — D509 Iron deficiency anemia, unspecified: Secondary | ICD-10-CM | POA: Diagnosis not present

## 2015-04-16 DIAGNOSIS — D631 Anemia in chronic kidney disease: Secondary | ICD-10-CM | POA: Diagnosis not present

## 2015-04-16 DIAGNOSIS — N186 End stage renal disease: Secondary | ICD-10-CM | POA: Diagnosis not present

## 2015-04-16 DIAGNOSIS — N2581 Secondary hyperparathyroidism of renal origin: Secondary | ICD-10-CM | POA: Diagnosis not present

## 2015-04-19 DIAGNOSIS — D509 Iron deficiency anemia, unspecified: Secondary | ICD-10-CM | POA: Diagnosis not present

## 2015-04-19 DIAGNOSIS — N2581 Secondary hyperparathyroidism of renal origin: Secondary | ICD-10-CM | POA: Diagnosis not present

## 2015-04-19 DIAGNOSIS — N186 End stage renal disease: Secondary | ICD-10-CM | POA: Diagnosis not present

## 2015-04-19 DIAGNOSIS — D631 Anemia in chronic kidney disease: Secondary | ICD-10-CM | POA: Diagnosis not present

## 2015-04-21 DIAGNOSIS — N2581 Secondary hyperparathyroidism of renal origin: Secondary | ICD-10-CM | POA: Diagnosis not present

## 2015-04-21 DIAGNOSIS — N186 End stage renal disease: Secondary | ICD-10-CM | POA: Diagnosis not present

## 2015-04-21 DIAGNOSIS — D509 Iron deficiency anemia, unspecified: Secondary | ICD-10-CM | POA: Diagnosis not present

## 2015-04-21 DIAGNOSIS — D631 Anemia in chronic kidney disease: Secondary | ICD-10-CM | POA: Diagnosis not present

## 2015-04-23 DIAGNOSIS — N2581 Secondary hyperparathyroidism of renal origin: Secondary | ICD-10-CM | POA: Diagnosis not present

## 2015-04-23 DIAGNOSIS — N186 End stage renal disease: Secondary | ICD-10-CM | POA: Diagnosis not present

## 2015-04-23 DIAGNOSIS — D631 Anemia in chronic kidney disease: Secondary | ICD-10-CM | POA: Diagnosis not present

## 2015-04-23 DIAGNOSIS — D509 Iron deficiency anemia, unspecified: Secondary | ICD-10-CM | POA: Diagnosis not present

## 2015-04-26 DIAGNOSIS — N2581 Secondary hyperparathyroidism of renal origin: Secondary | ICD-10-CM | POA: Diagnosis not present

## 2015-04-26 DIAGNOSIS — N186 End stage renal disease: Secondary | ICD-10-CM | POA: Diagnosis not present

## 2015-04-26 DIAGNOSIS — D631 Anemia in chronic kidney disease: Secondary | ICD-10-CM | POA: Diagnosis not present

## 2015-04-26 DIAGNOSIS — D509 Iron deficiency anemia, unspecified: Secondary | ICD-10-CM | POA: Diagnosis not present

## 2015-04-28 DIAGNOSIS — N2581 Secondary hyperparathyroidism of renal origin: Secondary | ICD-10-CM | POA: Diagnosis not present

## 2015-04-28 DIAGNOSIS — N186 End stage renal disease: Secondary | ICD-10-CM | POA: Diagnosis not present

## 2015-04-28 DIAGNOSIS — D509 Iron deficiency anemia, unspecified: Secondary | ICD-10-CM | POA: Diagnosis not present

## 2015-04-28 DIAGNOSIS — D631 Anemia in chronic kidney disease: Secondary | ICD-10-CM | POA: Diagnosis not present

## 2015-04-30 DIAGNOSIS — D509 Iron deficiency anemia, unspecified: Secondary | ICD-10-CM | POA: Diagnosis not present

## 2015-04-30 DIAGNOSIS — N186 End stage renal disease: Secondary | ICD-10-CM | POA: Diagnosis not present

## 2015-04-30 DIAGNOSIS — D631 Anemia in chronic kidney disease: Secondary | ICD-10-CM | POA: Diagnosis not present

## 2015-04-30 DIAGNOSIS — N2581 Secondary hyperparathyroidism of renal origin: Secondary | ICD-10-CM | POA: Diagnosis not present

## 2015-05-03 DIAGNOSIS — D509 Iron deficiency anemia, unspecified: Secondary | ICD-10-CM | POA: Diagnosis not present

## 2015-05-03 DIAGNOSIS — D631 Anemia in chronic kidney disease: Secondary | ICD-10-CM | POA: Diagnosis not present

## 2015-05-03 DIAGNOSIS — N186 End stage renal disease: Secondary | ICD-10-CM | POA: Diagnosis not present

## 2015-05-03 DIAGNOSIS — N2581 Secondary hyperparathyroidism of renal origin: Secondary | ICD-10-CM | POA: Diagnosis not present

## 2015-05-04 DIAGNOSIS — N186 End stage renal disease: Secondary | ICD-10-CM | POA: Diagnosis not present

## 2015-05-04 DIAGNOSIS — D631 Anemia in chronic kidney disease: Secondary | ICD-10-CM | POA: Diagnosis not present

## 2015-05-04 DIAGNOSIS — N2581 Secondary hyperparathyroidism of renal origin: Secondary | ICD-10-CM | POA: Diagnosis not present

## 2015-05-04 DIAGNOSIS — D509 Iron deficiency anemia, unspecified: Secondary | ICD-10-CM | POA: Diagnosis not present

## 2015-05-07 DIAGNOSIS — N2581 Secondary hyperparathyroidism of renal origin: Secondary | ICD-10-CM | POA: Diagnosis not present

## 2015-05-07 DIAGNOSIS — D631 Anemia in chronic kidney disease: Secondary | ICD-10-CM | POA: Diagnosis not present

## 2015-05-07 DIAGNOSIS — D509 Iron deficiency anemia, unspecified: Secondary | ICD-10-CM | POA: Diagnosis not present

## 2015-05-07 DIAGNOSIS — N186 End stage renal disease: Secondary | ICD-10-CM | POA: Diagnosis not present

## 2015-05-08 DIAGNOSIS — N186 End stage renal disease: Secondary | ICD-10-CM | POA: Diagnosis not present

## 2015-05-08 DIAGNOSIS — T8612 Kidney transplant failure: Secondary | ICD-10-CM | POA: Diagnosis not present

## 2015-05-08 DIAGNOSIS — Z992 Dependence on renal dialysis: Secondary | ICD-10-CM | POA: Diagnosis not present

## 2015-05-10 DIAGNOSIS — N2581 Secondary hyperparathyroidism of renal origin: Secondary | ICD-10-CM | POA: Diagnosis not present

## 2015-05-10 DIAGNOSIS — E119 Type 2 diabetes mellitus without complications: Secondary | ICD-10-CM | POA: Diagnosis not present

## 2015-05-10 DIAGNOSIS — D509 Iron deficiency anemia, unspecified: Secondary | ICD-10-CM | POA: Diagnosis not present

## 2015-05-10 DIAGNOSIS — E440 Moderate protein-calorie malnutrition: Secondary | ICD-10-CM | POA: Diagnosis not present

## 2015-05-10 DIAGNOSIS — N186 End stage renal disease: Secondary | ICD-10-CM | POA: Diagnosis not present

## 2015-05-10 DIAGNOSIS — D631 Anemia in chronic kidney disease: Secondary | ICD-10-CM | POA: Diagnosis not present

## 2015-05-12 DIAGNOSIS — E440 Moderate protein-calorie malnutrition: Secondary | ICD-10-CM | POA: Diagnosis not present

## 2015-05-12 DIAGNOSIS — E119 Type 2 diabetes mellitus without complications: Secondary | ICD-10-CM | POA: Diagnosis not present

## 2015-05-12 DIAGNOSIS — N186 End stage renal disease: Secondary | ICD-10-CM | POA: Diagnosis not present

## 2015-05-12 DIAGNOSIS — N2581 Secondary hyperparathyroidism of renal origin: Secondary | ICD-10-CM | POA: Diagnosis not present

## 2015-05-12 DIAGNOSIS — D509 Iron deficiency anemia, unspecified: Secondary | ICD-10-CM | POA: Diagnosis not present

## 2015-05-12 DIAGNOSIS — D631 Anemia in chronic kidney disease: Secondary | ICD-10-CM | POA: Diagnosis not present

## 2015-05-14 DIAGNOSIS — D509 Iron deficiency anemia, unspecified: Secondary | ICD-10-CM | POA: Diagnosis not present

## 2015-05-14 DIAGNOSIS — E440 Moderate protein-calorie malnutrition: Secondary | ICD-10-CM | POA: Diagnosis not present

## 2015-05-14 DIAGNOSIS — D631 Anemia in chronic kidney disease: Secondary | ICD-10-CM | POA: Diagnosis not present

## 2015-05-14 DIAGNOSIS — N2581 Secondary hyperparathyroidism of renal origin: Secondary | ICD-10-CM | POA: Diagnosis not present

## 2015-05-14 DIAGNOSIS — E119 Type 2 diabetes mellitus without complications: Secondary | ICD-10-CM | POA: Diagnosis not present

## 2015-05-14 DIAGNOSIS — N186 End stage renal disease: Secondary | ICD-10-CM | POA: Diagnosis not present

## 2015-05-17 DIAGNOSIS — E119 Type 2 diabetes mellitus without complications: Secondary | ICD-10-CM | POA: Diagnosis not present

## 2015-05-17 DIAGNOSIS — D631 Anemia in chronic kidney disease: Secondary | ICD-10-CM | POA: Diagnosis not present

## 2015-05-17 DIAGNOSIS — D509 Iron deficiency anemia, unspecified: Secondary | ICD-10-CM | POA: Diagnosis not present

## 2015-05-17 DIAGNOSIS — N186 End stage renal disease: Secondary | ICD-10-CM | POA: Diagnosis not present

## 2015-05-17 DIAGNOSIS — E440 Moderate protein-calorie malnutrition: Secondary | ICD-10-CM | POA: Diagnosis not present

## 2015-05-17 DIAGNOSIS — N2581 Secondary hyperparathyroidism of renal origin: Secondary | ICD-10-CM | POA: Diagnosis not present

## 2015-05-19 DIAGNOSIS — N2581 Secondary hyperparathyroidism of renal origin: Secondary | ICD-10-CM | POA: Diagnosis not present

## 2015-05-19 DIAGNOSIS — E440 Moderate protein-calorie malnutrition: Secondary | ICD-10-CM | POA: Diagnosis not present

## 2015-05-19 DIAGNOSIS — D509 Iron deficiency anemia, unspecified: Secondary | ICD-10-CM | POA: Diagnosis not present

## 2015-05-19 DIAGNOSIS — N186 End stage renal disease: Secondary | ICD-10-CM | POA: Diagnosis not present

## 2015-05-19 DIAGNOSIS — D631 Anemia in chronic kidney disease: Secondary | ICD-10-CM | POA: Diagnosis not present

## 2015-05-19 DIAGNOSIS — E119 Type 2 diabetes mellitus without complications: Secondary | ICD-10-CM | POA: Diagnosis not present

## 2015-05-21 DIAGNOSIS — D509 Iron deficiency anemia, unspecified: Secondary | ICD-10-CM | POA: Diagnosis not present

## 2015-05-21 DIAGNOSIS — D631 Anemia in chronic kidney disease: Secondary | ICD-10-CM | POA: Diagnosis not present

## 2015-05-21 DIAGNOSIS — E119 Type 2 diabetes mellitus without complications: Secondary | ICD-10-CM | POA: Diagnosis not present

## 2015-05-21 DIAGNOSIS — N186 End stage renal disease: Secondary | ICD-10-CM | POA: Diagnosis not present

## 2015-05-21 DIAGNOSIS — E440 Moderate protein-calorie malnutrition: Secondary | ICD-10-CM | POA: Diagnosis not present

## 2015-05-21 DIAGNOSIS — N2581 Secondary hyperparathyroidism of renal origin: Secondary | ICD-10-CM | POA: Diagnosis not present

## 2015-05-24 DIAGNOSIS — E440 Moderate protein-calorie malnutrition: Secondary | ICD-10-CM | POA: Diagnosis not present

## 2015-05-24 DIAGNOSIS — D631 Anemia in chronic kidney disease: Secondary | ICD-10-CM | POA: Diagnosis not present

## 2015-05-24 DIAGNOSIS — N186 End stage renal disease: Secondary | ICD-10-CM | POA: Diagnosis not present

## 2015-05-24 DIAGNOSIS — E119 Type 2 diabetes mellitus without complications: Secondary | ICD-10-CM | POA: Diagnosis not present

## 2015-05-24 DIAGNOSIS — N2581 Secondary hyperparathyroidism of renal origin: Secondary | ICD-10-CM | POA: Diagnosis not present

## 2015-05-24 DIAGNOSIS — D509 Iron deficiency anemia, unspecified: Secondary | ICD-10-CM | POA: Diagnosis not present

## 2015-05-26 DIAGNOSIS — N2581 Secondary hyperparathyroidism of renal origin: Secondary | ICD-10-CM | POA: Diagnosis not present

## 2015-05-26 DIAGNOSIS — N186 End stage renal disease: Secondary | ICD-10-CM | POA: Diagnosis not present

## 2015-05-26 DIAGNOSIS — D631 Anemia in chronic kidney disease: Secondary | ICD-10-CM | POA: Diagnosis not present

## 2015-05-26 DIAGNOSIS — E440 Moderate protein-calorie malnutrition: Secondary | ICD-10-CM | POA: Diagnosis not present

## 2015-05-26 DIAGNOSIS — E119 Type 2 diabetes mellitus without complications: Secondary | ICD-10-CM | POA: Diagnosis not present

## 2015-05-26 DIAGNOSIS — D509 Iron deficiency anemia, unspecified: Secondary | ICD-10-CM | POA: Diagnosis not present

## 2015-05-28 DIAGNOSIS — D631 Anemia in chronic kidney disease: Secondary | ICD-10-CM | POA: Diagnosis not present

## 2015-05-28 DIAGNOSIS — E440 Moderate protein-calorie malnutrition: Secondary | ICD-10-CM | POA: Diagnosis not present

## 2015-05-28 DIAGNOSIS — N186 End stage renal disease: Secondary | ICD-10-CM | POA: Diagnosis not present

## 2015-05-28 DIAGNOSIS — D509 Iron deficiency anemia, unspecified: Secondary | ICD-10-CM | POA: Diagnosis not present

## 2015-05-28 DIAGNOSIS — N2581 Secondary hyperparathyroidism of renal origin: Secondary | ICD-10-CM | POA: Diagnosis not present

## 2015-05-28 DIAGNOSIS — E119 Type 2 diabetes mellitus without complications: Secondary | ICD-10-CM | POA: Diagnosis not present

## 2015-05-31 DIAGNOSIS — D631 Anemia in chronic kidney disease: Secondary | ICD-10-CM | POA: Diagnosis not present

## 2015-05-31 DIAGNOSIS — N186 End stage renal disease: Secondary | ICD-10-CM | POA: Diagnosis not present

## 2015-05-31 DIAGNOSIS — E119 Type 2 diabetes mellitus without complications: Secondary | ICD-10-CM | POA: Diagnosis not present

## 2015-05-31 DIAGNOSIS — N2581 Secondary hyperparathyroidism of renal origin: Secondary | ICD-10-CM | POA: Diagnosis not present

## 2015-05-31 DIAGNOSIS — D509 Iron deficiency anemia, unspecified: Secondary | ICD-10-CM | POA: Diagnosis not present

## 2015-05-31 DIAGNOSIS — E440 Moderate protein-calorie malnutrition: Secondary | ICD-10-CM | POA: Diagnosis not present

## 2015-06-02 DIAGNOSIS — E119 Type 2 diabetes mellitus without complications: Secondary | ICD-10-CM | POA: Diagnosis not present

## 2015-06-02 DIAGNOSIS — N2581 Secondary hyperparathyroidism of renal origin: Secondary | ICD-10-CM | POA: Diagnosis not present

## 2015-06-02 DIAGNOSIS — D509 Iron deficiency anemia, unspecified: Secondary | ICD-10-CM | POA: Diagnosis not present

## 2015-06-02 DIAGNOSIS — D631 Anemia in chronic kidney disease: Secondary | ICD-10-CM | POA: Diagnosis not present

## 2015-06-02 DIAGNOSIS — E440 Moderate protein-calorie malnutrition: Secondary | ICD-10-CM | POA: Diagnosis not present

## 2015-06-02 DIAGNOSIS — N186 End stage renal disease: Secondary | ICD-10-CM | POA: Diagnosis not present

## 2015-06-04 DIAGNOSIS — N186 End stage renal disease: Secondary | ICD-10-CM | POA: Diagnosis not present

## 2015-06-04 DIAGNOSIS — D631 Anemia in chronic kidney disease: Secondary | ICD-10-CM | POA: Diagnosis not present

## 2015-06-04 DIAGNOSIS — D509 Iron deficiency anemia, unspecified: Secondary | ICD-10-CM | POA: Diagnosis not present

## 2015-06-04 DIAGNOSIS — N2581 Secondary hyperparathyroidism of renal origin: Secondary | ICD-10-CM | POA: Diagnosis not present

## 2015-06-04 DIAGNOSIS — E119 Type 2 diabetes mellitus without complications: Secondary | ICD-10-CM | POA: Diagnosis not present

## 2015-06-04 DIAGNOSIS — E440 Moderate protein-calorie malnutrition: Secondary | ICD-10-CM | POA: Diagnosis not present

## 2015-06-07 DIAGNOSIS — N2581 Secondary hyperparathyroidism of renal origin: Secondary | ICD-10-CM | POA: Diagnosis not present

## 2015-06-07 DIAGNOSIS — E119 Type 2 diabetes mellitus without complications: Secondary | ICD-10-CM | POA: Diagnosis not present

## 2015-06-07 DIAGNOSIS — E440 Moderate protein-calorie malnutrition: Secondary | ICD-10-CM | POA: Diagnosis not present

## 2015-06-07 DIAGNOSIS — D509 Iron deficiency anemia, unspecified: Secondary | ICD-10-CM | POA: Diagnosis not present

## 2015-06-07 DIAGNOSIS — D631 Anemia in chronic kidney disease: Secondary | ICD-10-CM | POA: Diagnosis not present

## 2015-06-07 DIAGNOSIS — N186 End stage renal disease: Secondary | ICD-10-CM | POA: Diagnosis not present

## 2015-06-08 DIAGNOSIS — Z992 Dependence on renal dialysis: Secondary | ICD-10-CM | POA: Diagnosis not present

## 2015-06-08 DIAGNOSIS — N186 End stage renal disease: Secondary | ICD-10-CM | POA: Diagnosis not present

## 2015-06-08 DIAGNOSIS — T8612 Kidney transplant failure: Secondary | ICD-10-CM | POA: Diagnosis not present

## 2015-06-09 DIAGNOSIS — N2581 Secondary hyperparathyroidism of renal origin: Secondary | ICD-10-CM | POA: Diagnosis not present

## 2015-06-09 DIAGNOSIS — Z23 Encounter for immunization: Secondary | ICD-10-CM | POA: Diagnosis not present

## 2015-06-09 DIAGNOSIS — D631 Anemia in chronic kidney disease: Secondary | ICD-10-CM | POA: Diagnosis not present

## 2015-06-09 DIAGNOSIS — E119 Type 2 diabetes mellitus without complications: Secondary | ICD-10-CM | POA: Diagnosis not present

## 2015-06-09 DIAGNOSIS — N186 End stage renal disease: Secondary | ICD-10-CM | POA: Diagnosis not present

## 2015-06-10 ENCOUNTER — Telehealth: Payer: Self-pay | Admitting: Hematology

## 2015-06-10 NOTE — Telephone Encounter (Signed)
per pof to sch pt appt-cld & left pt appt time & date 7/6@1 

## 2015-06-11 DIAGNOSIS — N2581 Secondary hyperparathyroidism of renal origin: Secondary | ICD-10-CM | POA: Diagnosis not present

## 2015-06-11 DIAGNOSIS — D631 Anemia in chronic kidney disease: Secondary | ICD-10-CM | POA: Diagnosis not present

## 2015-06-11 DIAGNOSIS — N186 End stage renal disease: Secondary | ICD-10-CM | POA: Diagnosis not present

## 2015-06-11 DIAGNOSIS — E119 Type 2 diabetes mellitus without complications: Secondary | ICD-10-CM | POA: Diagnosis not present

## 2015-06-11 DIAGNOSIS — Z23 Encounter for immunization: Secondary | ICD-10-CM | POA: Diagnosis not present

## 2015-06-14 DIAGNOSIS — Z23 Encounter for immunization: Secondary | ICD-10-CM | POA: Diagnosis not present

## 2015-06-14 DIAGNOSIS — N2581 Secondary hyperparathyroidism of renal origin: Secondary | ICD-10-CM | POA: Diagnosis not present

## 2015-06-14 DIAGNOSIS — E119 Type 2 diabetes mellitus without complications: Secondary | ICD-10-CM | POA: Diagnosis not present

## 2015-06-14 DIAGNOSIS — D631 Anemia in chronic kidney disease: Secondary | ICD-10-CM | POA: Diagnosis not present

## 2015-06-14 DIAGNOSIS — N186 End stage renal disease: Secondary | ICD-10-CM | POA: Diagnosis not present

## 2015-06-16 DIAGNOSIS — Z23 Encounter for immunization: Secondary | ICD-10-CM | POA: Diagnosis not present

## 2015-06-16 DIAGNOSIS — N186 End stage renal disease: Secondary | ICD-10-CM | POA: Diagnosis not present

## 2015-06-16 DIAGNOSIS — E119 Type 2 diabetes mellitus without complications: Secondary | ICD-10-CM | POA: Diagnosis not present

## 2015-06-16 DIAGNOSIS — N2581 Secondary hyperparathyroidism of renal origin: Secondary | ICD-10-CM | POA: Diagnosis not present

## 2015-06-16 DIAGNOSIS — D631 Anemia in chronic kidney disease: Secondary | ICD-10-CM | POA: Diagnosis not present

## 2015-06-18 DIAGNOSIS — Z23 Encounter for immunization: Secondary | ICD-10-CM | POA: Diagnosis not present

## 2015-06-18 DIAGNOSIS — E119 Type 2 diabetes mellitus without complications: Secondary | ICD-10-CM | POA: Diagnosis not present

## 2015-06-18 DIAGNOSIS — N2581 Secondary hyperparathyroidism of renal origin: Secondary | ICD-10-CM | POA: Diagnosis not present

## 2015-06-18 DIAGNOSIS — N186 End stage renal disease: Secondary | ICD-10-CM | POA: Diagnosis not present

## 2015-06-18 DIAGNOSIS — D631 Anemia in chronic kidney disease: Secondary | ICD-10-CM | POA: Diagnosis not present

## 2015-06-21 DIAGNOSIS — N186 End stage renal disease: Secondary | ICD-10-CM | POA: Diagnosis not present

## 2015-06-21 DIAGNOSIS — Z23 Encounter for immunization: Secondary | ICD-10-CM | POA: Diagnosis not present

## 2015-06-21 DIAGNOSIS — D631 Anemia in chronic kidney disease: Secondary | ICD-10-CM | POA: Diagnosis not present

## 2015-06-21 DIAGNOSIS — N2581 Secondary hyperparathyroidism of renal origin: Secondary | ICD-10-CM | POA: Diagnosis not present

## 2015-06-21 DIAGNOSIS — E119 Type 2 diabetes mellitus without complications: Secondary | ICD-10-CM | POA: Diagnosis not present

## 2015-06-23 DIAGNOSIS — N186 End stage renal disease: Secondary | ICD-10-CM | POA: Diagnosis not present

## 2015-06-23 DIAGNOSIS — E119 Type 2 diabetes mellitus without complications: Secondary | ICD-10-CM | POA: Diagnosis not present

## 2015-06-23 DIAGNOSIS — D631 Anemia in chronic kidney disease: Secondary | ICD-10-CM | POA: Diagnosis not present

## 2015-06-23 DIAGNOSIS — Z23 Encounter for immunization: Secondary | ICD-10-CM | POA: Diagnosis not present

## 2015-06-23 DIAGNOSIS — N2581 Secondary hyperparathyroidism of renal origin: Secondary | ICD-10-CM | POA: Diagnosis not present

## 2015-06-25 DIAGNOSIS — D631 Anemia in chronic kidney disease: Secondary | ICD-10-CM | POA: Diagnosis not present

## 2015-06-25 DIAGNOSIS — N186 End stage renal disease: Secondary | ICD-10-CM | POA: Diagnosis not present

## 2015-06-25 DIAGNOSIS — N2581 Secondary hyperparathyroidism of renal origin: Secondary | ICD-10-CM | POA: Diagnosis not present

## 2015-06-25 DIAGNOSIS — E119 Type 2 diabetes mellitus without complications: Secondary | ICD-10-CM | POA: Diagnosis not present

## 2015-06-25 DIAGNOSIS — Z23 Encounter for immunization: Secondary | ICD-10-CM | POA: Diagnosis not present

## 2015-06-28 DIAGNOSIS — E119 Type 2 diabetes mellitus without complications: Secondary | ICD-10-CM | POA: Diagnosis not present

## 2015-06-28 DIAGNOSIS — N186 End stage renal disease: Secondary | ICD-10-CM | POA: Diagnosis not present

## 2015-06-28 DIAGNOSIS — Z23 Encounter for immunization: Secondary | ICD-10-CM | POA: Diagnosis not present

## 2015-06-28 DIAGNOSIS — D631 Anemia in chronic kidney disease: Secondary | ICD-10-CM | POA: Diagnosis not present

## 2015-06-28 DIAGNOSIS — N2581 Secondary hyperparathyroidism of renal origin: Secondary | ICD-10-CM | POA: Diagnosis not present

## 2015-06-30 DIAGNOSIS — E119 Type 2 diabetes mellitus without complications: Secondary | ICD-10-CM | POA: Diagnosis not present

## 2015-06-30 DIAGNOSIS — N186 End stage renal disease: Secondary | ICD-10-CM | POA: Diagnosis not present

## 2015-06-30 DIAGNOSIS — D631 Anemia in chronic kidney disease: Secondary | ICD-10-CM | POA: Diagnosis not present

## 2015-06-30 DIAGNOSIS — Z23 Encounter for immunization: Secondary | ICD-10-CM | POA: Diagnosis not present

## 2015-06-30 DIAGNOSIS — N2581 Secondary hyperparathyroidism of renal origin: Secondary | ICD-10-CM | POA: Diagnosis not present

## 2015-07-02 DIAGNOSIS — N186 End stage renal disease: Secondary | ICD-10-CM | POA: Diagnosis not present

## 2015-07-02 DIAGNOSIS — E119 Type 2 diabetes mellitus without complications: Secondary | ICD-10-CM | POA: Diagnosis not present

## 2015-07-02 DIAGNOSIS — D631 Anemia in chronic kidney disease: Secondary | ICD-10-CM | POA: Diagnosis not present

## 2015-07-02 DIAGNOSIS — N2581 Secondary hyperparathyroidism of renal origin: Secondary | ICD-10-CM | POA: Diagnosis not present

## 2015-07-02 DIAGNOSIS — Z23 Encounter for immunization: Secondary | ICD-10-CM | POA: Diagnosis not present

## 2015-07-05 DIAGNOSIS — D631 Anemia in chronic kidney disease: Secondary | ICD-10-CM | POA: Diagnosis not present

## 2015-07-05 DIAGNOSIS — N186 End stage renal disease: Secondary | ICD-10-CM | POA: Diagnosis not present

## 2015-07-05 DIAGNOSIS — N2581 Secondary hyperparathyroidism of renal origin: Secondary | ICD-10-CM | POA: Diagnosis not present

## 2015-07-05 DIAGNOSIS — E119 Type 2 diabetes mellitus without complications: Secondary | ICD-10-CM | POA: Diagnosis not present

## 2015-07-05 DIAGNOSIS — Z23 Encounter for immunization: Secondary | ICD-10-CM | POA: Diagnosis not present

## 2015-07-07 DIAGNOSIS — Z23 Encounter for immunization: Secondary | ICD-10-CM | POA: Diagnosis not present

## 2015-07-07 DIAGNOSIS — N2581 Secondary hyperparathyroidism of renal origin: Secondary | ICD-10-CM | POA: Diagnosis not present

## 2015-07-07 DIAGNOSIS — D631 Anemia in chronic kidney disease: Secondary | ICD-10-CM | POA: Diagnosis not present

## 2015-07-07 DIAGNOSIS — E119 Type 2 diabetes mellitus without complications: Secondary | ICD-10-CM | POA: Diagnosis not present

## 2015-07-07 DIAGNOSIS — N186 End stage renal disease: Secondary | ICD-10-CM | POA: Diagnosis not present

## 2015-07-08 DIAGNOSIS — N186 End stage renal disease: Secondary | ICD-10-CM | POA: Diagnosis not present

## 2015-07-08 DIAGNOSIS — T8612 Kidney transplant failure: Secondary | ICD-10-CM | POA: Diagnosis not present

## 2015-07-08 DIAGNOSIS — Z992 Dependence on renal dialysis: Secondary | ICD-10-CM | POA: Diagnosis not present

## 2015-07-09 DIAGNOSIS — E119 Type 2 diabetes mellitus without complications: Secondary | ICD-10-CM | POA: Diagnosis not present

## 2015-07-09 DIAGNOSIS — N2581 Secondary hyperparathyroidism of renal origin: Secondary | ICD-10-CM | POA: Diagnosis not present

## 2015-07-09 DIAGNOSIS — N186 End stage renal disease: Secondary | ICD-10-CM | POA: Diagnosis not present

## 2015-07-12 DIAGNOSIS — N2581 Secondary hyperparathyroidism of renal origin: Secondary | ICD-10-CM | POA: Diagnosis not present

## 2015-07-12 DIAGNOSIS — N186 End stage renal disease: Secondary | ICD-10-CM | POA: Diagnosis not present

## 2015-07-12 DIAGNOSIS — E119 Type 2 diabetes mellitus without complications: Secondary | ICD-10-CM | POA: Diagnosis not present

## 2015-07-14 ENCOUNTER — Ambulatory Visit: Payer: Medicare Other | Admitting: Hematology

## 2015-07-14 ENCOUNTER — Other Ambulatory Visit: Payer: Medicare Other

## 2015-07-14 DIAGNOSIS — N2581 Secondary hyperparathyroidism of renal origin: Secondary | ICD-10-CM | POA: Diagnosis not present

## 2015-07-14 DIAGNOSIS — N186 End stage renal disease: Secondary | ICD-10-CM | POA: Diagnosis not present

## 2015-07-14 DIAGNOSIS — E119 Type 2 diabetes mellitus without complications: Secondary | ICD-10-CM | POA: Diagnosis not present

## 2015-07-16 DIAGNOSIS — E119 Type 2 diabetes mellitus without complications: Secondary | ICD-10-CM | POA: Diagnosis not present

## 2015-07-16 DIAGNOSIS — N186 End stage renal disease: Secondary | ICD-10-CM | POA: Diagnosis not present

## 2015-07-16 DIAGNOSIS — N2581 Secondary hyperparathyroidism of renal origin: Secondary | ICD-10-CM | POA: Diagnosis not present

## 2015-07-19 DIAGNOSIS — E119 Type 2 diabetes mellitus without complications: Secondary | ICD-10-CM | POA: Diagnosis not present

## 2015-07-19 DIAGNOSIS — N2581 Secondary hyperparathyroidism of renal origin: Secondary | ICD-10-CM | POA: Diagnosis not present

## 2015-07-19 DIAGNOSIS — N186 End stage renal disease: Secondary | ICD-10-CM | POA: Diagnosis not present

## 2015-07-19 IMAGING — CR DG CHEST 2V
2 series · 2 of 2 positions shown · non-contrast
Comparison: 07/08/2012

CLINICAL DATA: Shortness of breath history renal transplant, renal
insufficiency

CHEST - 2 VIEW

[w chest pa]
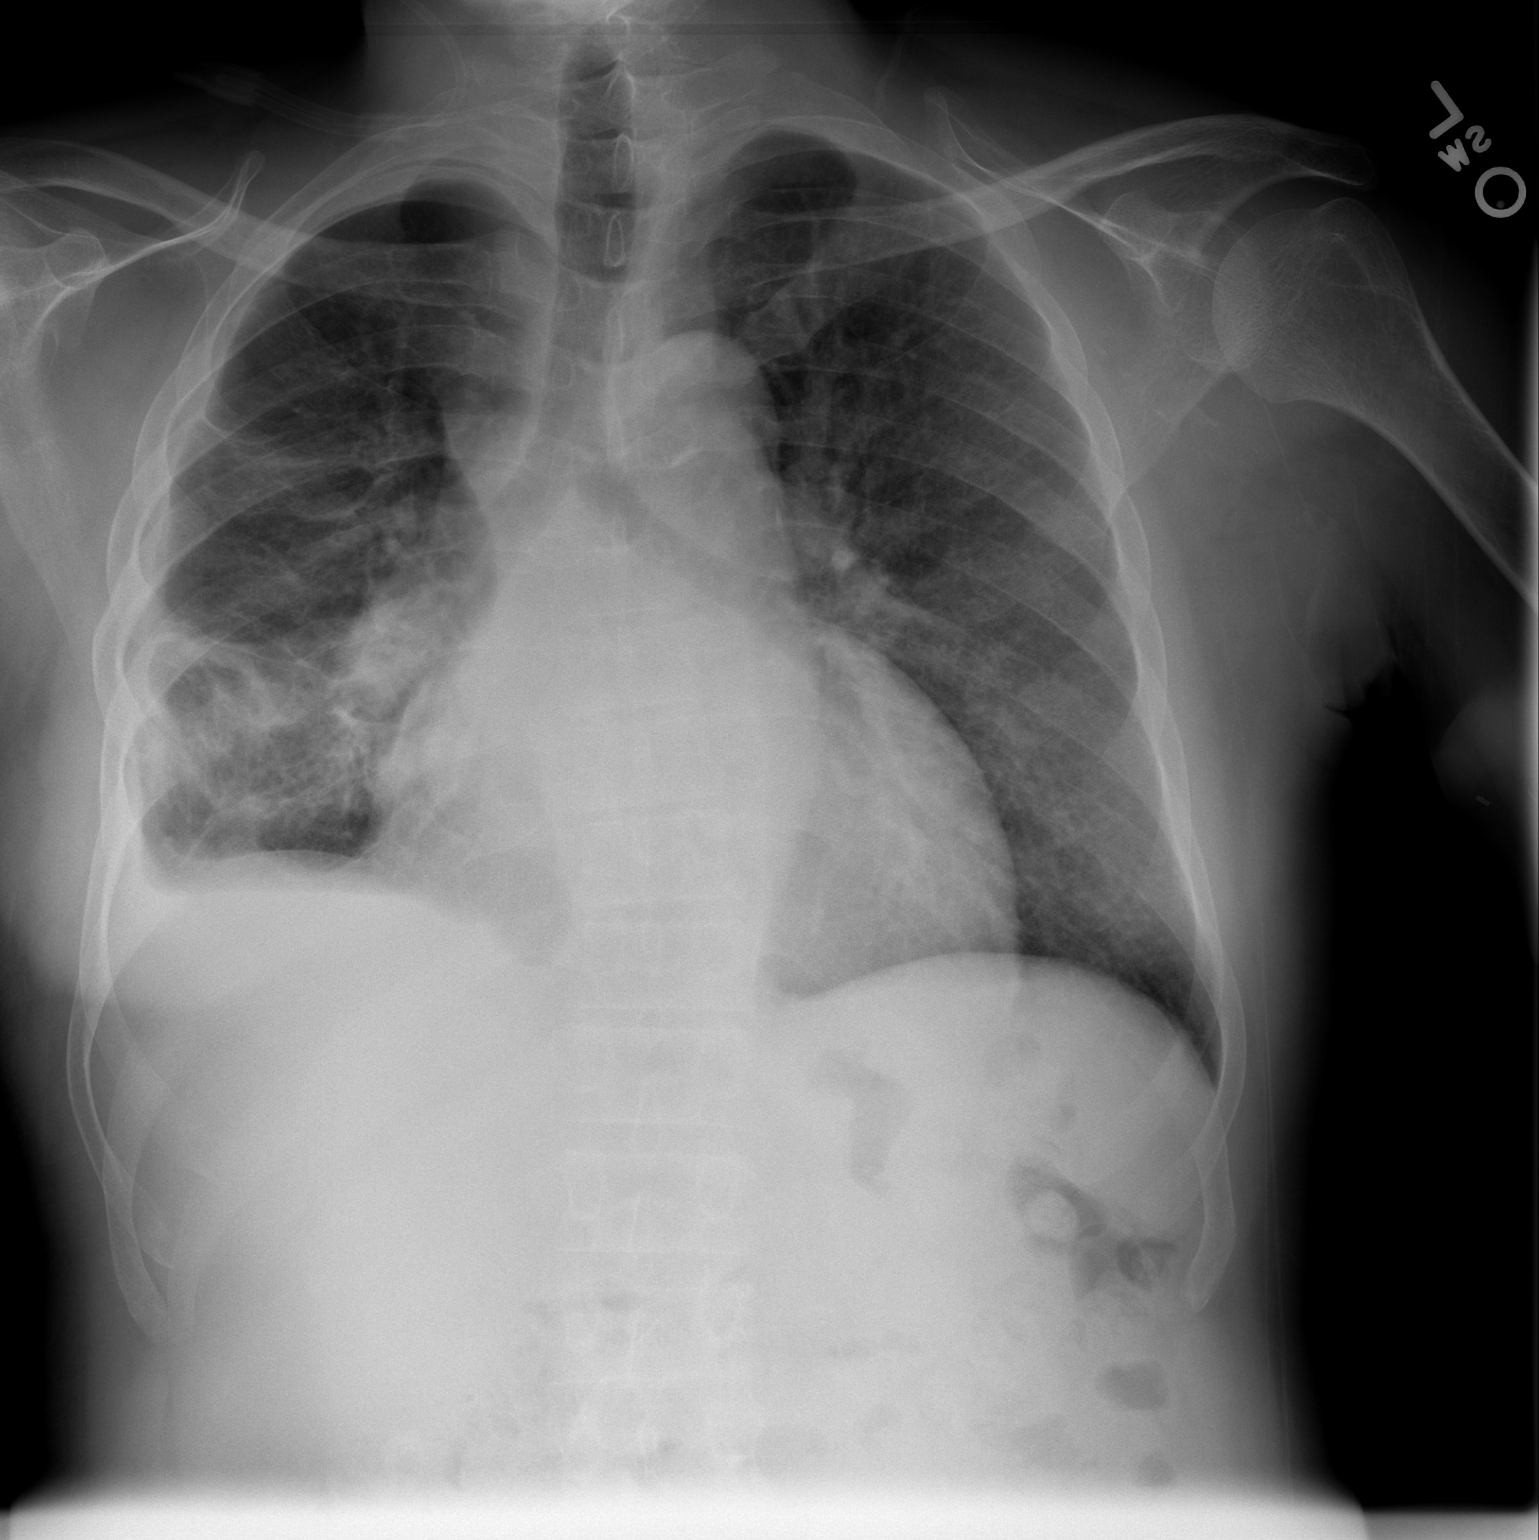

[w chest lat]
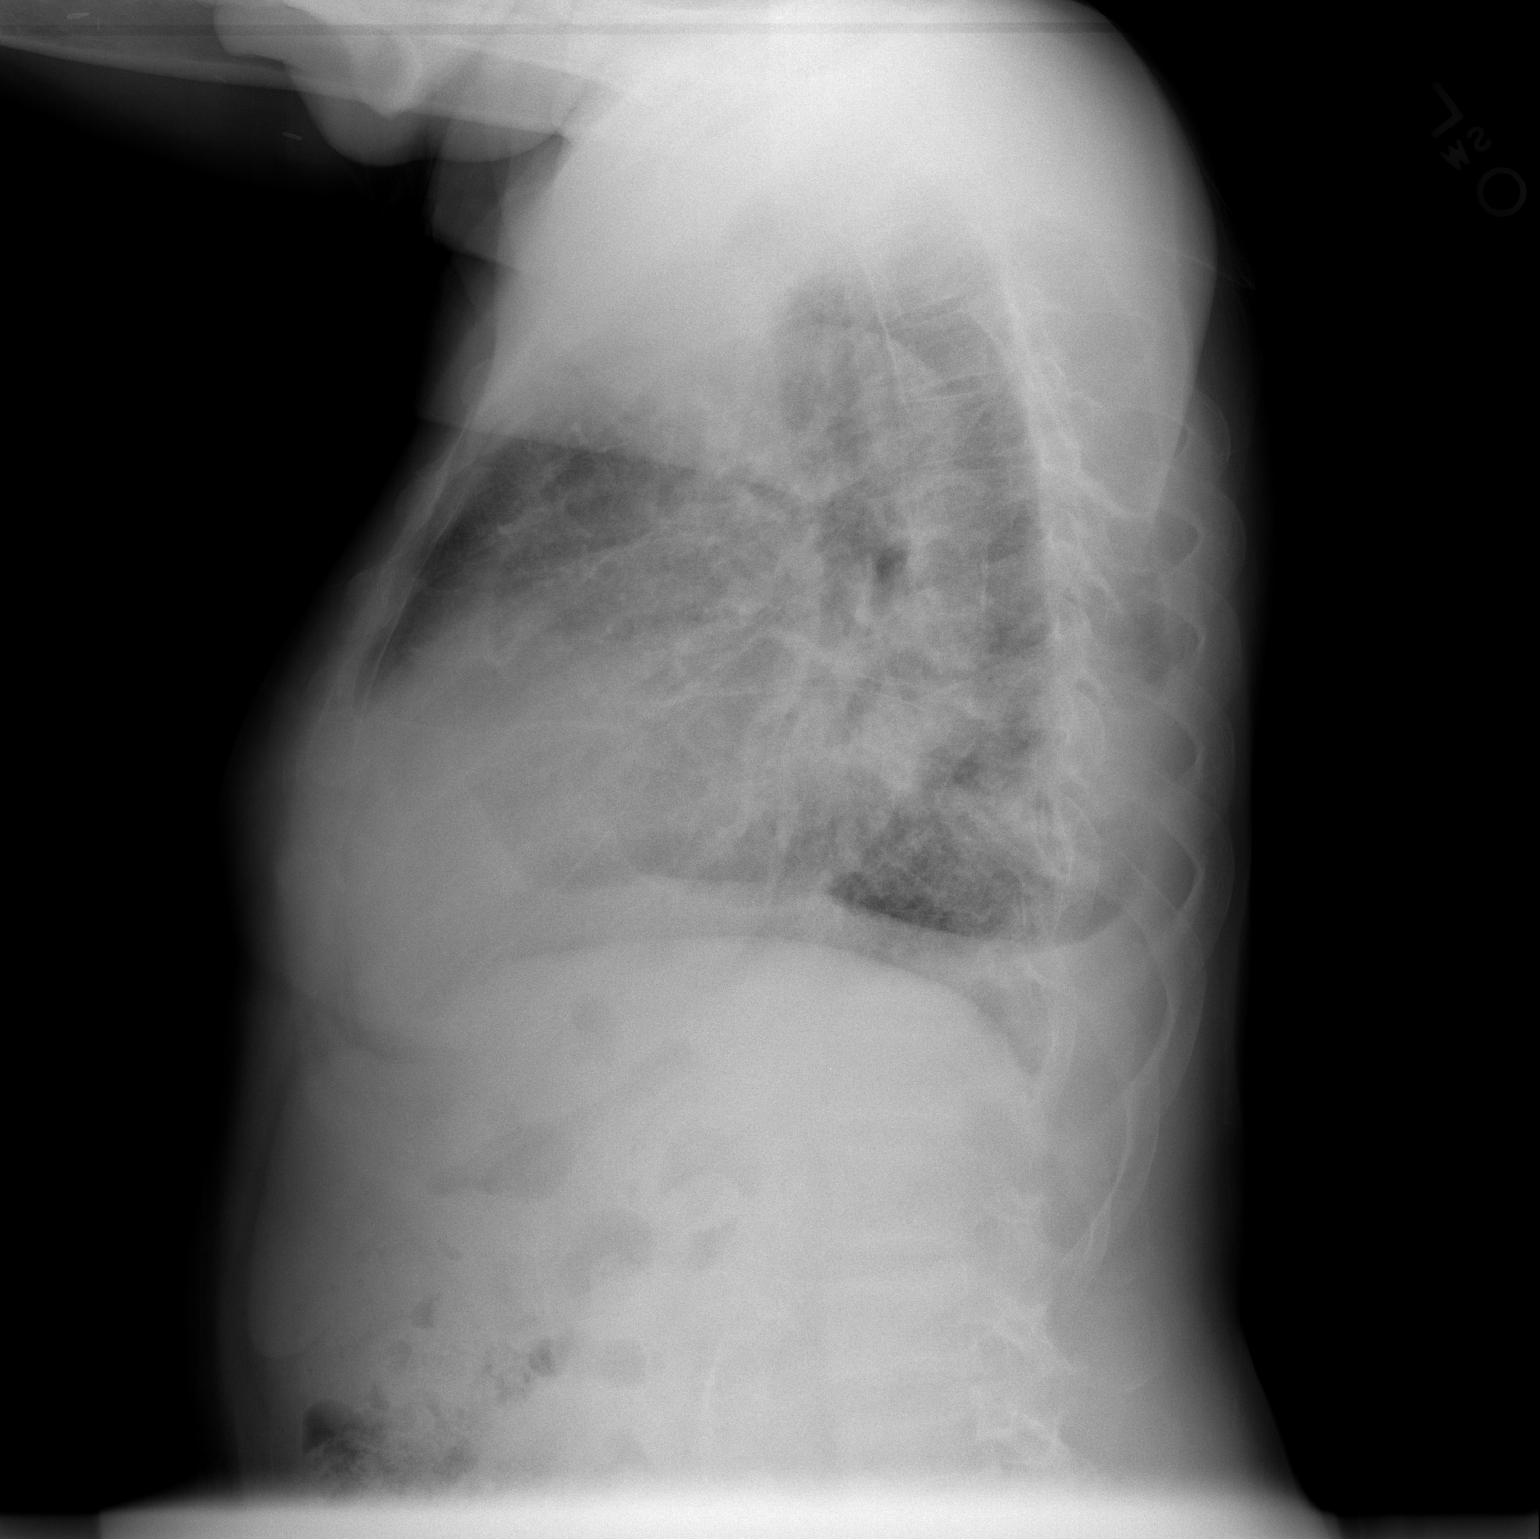

[2 of 2 positions shown; findings below may reference images not displayed]

FINDINGS: Cardiomegaly evident with diffuse interstitial changes
versus mild edema.  Chronic scarring and parenchymal distortion in
the right lower lobe with pleural thickening.  No pneumothorax.
Trachea midline.
IMPRESSION: Cardiomegaly with superimposed mild edema.  Stable chronic
appearance of the right hemithorax with scarring and pleural
thickening / fluid.

## 2015-07-21 DIAGNOSIS — N2581 Secondary hyperparathyroidism of renal origin: Secondary | ICD-10-CM | POA: Diagnosis not present

## 2015-07-21 DIAGNOSIS — N186 End stage renal disease: Secondary | ICD-10-CM | POA: Diagnosis not present

## 2015-07-21 DIAGNOSIS — E119 Type 2 diabetes mellitus without complications: Secondary | ICD-10-CM | POA: Diagnosis not present

## 2015-07-23 DIAGNOSIS — E119 Type 2 diabetes mellitus without complications: Secondary | ICD-10-CM | POA: Diagnosis not present

## 2015-07-23 DIAGNOSIS — N2581 Secondary hyperparathyroidism of renal origin: Secondary | ICD-10-CM | POA: Diagnosis not present

## 2015-07-23 DIAGNOSIS — N186 End stage renal disease: Secondary | ICD-10-CM | POA: Diagnosis not present

## 2015-07-26 DIAGNOSIS — N186 End stage renal disease: Secondary | ICD-10-CM | POA: Diagnosis not present

## 2015-07-26 DIAGNOSIS — E119 Type 2 diabetes mellitus without complications: Secondary | ICD-10-CM | POA: Diagnosis not present

## 2015-07-26 DIAGNOSIS — N2581 Secondary hyperparathyroidism of renal origin: Secondary | ICD-10-CM | POA: Diagnosis not present

## 2015-07-28 DIAGNOSIS — E119 Type 2 diabetes mellitus without complications: Secondary | ICD-10-CM | POA: Diagnosis not present

## 2015-07-28 DIAGNOSIS — N186 End stage renal disease: Secondary | ICD-10-CM | POA: Diagnosis not present

## 2015-07-28 DIAGNOSIS — N2581 Secondary hyperparathyroidism of renal origin: Secondary | ICD-10-CM | POA: Diagnosis not present

## 2015-07-30 DIAGNOSIS — N186 End stage renal disease: Secondary | ICD-10-CM | POA: Diagnosis not present

## 2015-07-30 DIAGNOSIS — N2581 Secondary hyperparathyroidism of renal origin: Secondary | ICD-10-CM | POA: Diagnosis not present

## 2015-07-30 DIAGNOSIS — E119 Type 2 diabetes mellitus without complications: Secondary | ICD-10-CM | POA: Diagnosis not present

## 2015-08-02 DIAGNOSIS — E119 Type 2 diabetes mellitus without complications: Secondary | ICD-10-CM | POA: Diagnosis not present

## 2015-08-02 DIAGNOSIS — N186 End stage renal disease: Secondary | ICD-10-CM | POA: Diagnosis not present

## 2015-08-02 DIAGNOSIS — N2581 Secondary hyperparathyroidism of renal origin: Secondary | ICD-10-CM | POA: Diagnosis not present

## 2015-08-04 DIAGNOSIS — N186 End stage renal disease: Secondary | ICD-10-CM | POA: Diagnosis not present

## 2015-08-04 DIAGNOSIS — E119 Type 2 diabetes mellitus without complications: Secondary | ICD-10-CM | POA: Diagnosis not present

## 2015-08-04 DIAGNOSIS — N2581 Secondary hyperparathyroidism of renal origin: Secondary | ICD-10-CM | POA: Diagnosis not present

## 2015-08-06 DIAGNOSIS — N2581 Secondary hyperparathyroidism of renal origin: Secondary | ICD-10-CM | POA: Diagnosis not present

## 2015-08-06 DIAGNOSIS — E119 Type 2 diabetes mellitus without complications: Secondary | ICD-10-CM | POA: Diagnosis not present

## 2015-08-06 DIAGNOSIS — N186 End stage renal disease: Secondary | ICD-10-CM | POA: Diagnosis not present

## 2015-08-08 DIAGNOSIS — Z992 Dependence on renal dialysis: Secondary | ICD-10-CM | POA: Diagnosis not present

## 2015-08-08 DIAGNOSIS — T8612 Kidney transplant failure: Secondary | ICD-10-CM | POA: Diagnosis not present

## 2015-08-08 DIAGNOSIS — N186 End stage renal disease: Secondary | ICD-10-CM | POA: Diagnosis not present

## 2015-08-09 DIAGNOSIS — N186 End stage renal disease: Secondary | ICD-10-CM | POA: Diagnosis not present

## 2015-08-09 DIAGNOSIS — N2581 Secondary hyperparathyroidism of renal origin: Secondary | ICD-10-CM | POA: Diagnosis not present

## 2015-08-09 DIAGNOSIS — E119 Type 2 diabetes mellitus without complications: Secondary | ICD-10-CM | POA: Diagnosis not present

## 2015-08-11 DIAGNOSIS — N186 End stage renal disease: Secondary | ICD-10-CM | POA: Diagnosis not present

## 2015-08-11 DIAGNOSIS — N2581 Secondary hyperparathyroidism of renal origin: Secondary | ICD-10-CM | POA: Diagnosis not present

## 2015-08-11 DIAGNOSIS — E119 Type 2 diabetes mellitus without complications: Secondary | ICD-10-CM | POA: Diagnosis not present

## 2015-08-13 DIAGNOSIS — E119 Type 2 diabetes mellitus without complications: Secondary | ICD-10-CM | POA: Diagnosis not present

## 2015-08-13 DIAGNOSIS — N186 End stage renal disease: Secondary | ICD-10-CM | POA: Diagnosis not present

## 2015-08-13 DIAGNOSIS — N2581 Secondary hyperparathyroidism of renal origin: Secondary | ICD-10-CM | POA: Diagnosis not present

## 2015-08-16 DIAGNOSIS — E119 Type 2 diabetes mellitus without complications: Secondary | ICD-10-CM | POA: Diagnosis not present

## 2015-08-16 DIAGNOSIS — N186 End stage renal disease: Secondary | ICD-10-CM | POA: Diagnosis not present

## 2015-08-16 DIAGNOSIS — N2581 Secondary hyperparathyroidism of renal origin: Secondary | ICD-10-CM | POA: Diagnosis not present

## 2015-08-18 DIAGNOSIS — E119 Type 2 diabetes mellitus without complications: Secondary | ICD-10-CM | POA: Diagnosis not present

## 2015-08-18 DIAGNOSIS — N2581 Secondary hyperparathyroidism of renal origin: Secondary | ICD-10-CM | POA: Diagnosis not present

## 2015-08-18 DIAGNOSIS — N186 End stage renal disease: Secondary | ICD-10-CM | POA: Diagnosis not present

## 2015-08-20 DIAGNOSIS — N186 End stage renal disease: Secondary | ICD-10-CM | POA: Diagnosis not present

## 2015-08-20 DIAGNOSIS — E119 Type 2 diabetes mellitus without complications: Secondary | ICD-10-CM | POA: Diagnosis not present

## 2015-08-20 DIAGNOSIS — N2581 Secondary hyperparathyroidism of renal origin: Secondary | ICD-10-CM | POA: Diagnosis not present

## 2015-08-23 DIAGNOSIS — E119 Type 2 diabetes mellitus without complications: Secondary | ICD-10-CM | POA: Diagnosis not present

## 2015-08-23 DIAGNOSIS — N186 End stage renal disease: Secondary | ICD-10-CM | POA: Diagnosis not present

## 2015-08-23 DIAGNOSIS — N2581 Secondary hyperparathyroidism of renal origin: Secondary | ICD-10-CM | POA: Diagnosis not present

## 2015-08-25 DIAGNOSIS — N186 End stage renal disease: Secondary | ICD-10-CM | POA: Diagnosis not present

## 2015-08-25 DIAGNOSIS — E119 Type 2 diabetes mellitus without complications: Secondary | ICD-10-CM | POA: Diagnosis not present

## 2015-08-25 DIAGNOSIS — N2581 Secondary hyperparathyroidism of renal origin: Secondary | ICD-10-CM | POA: Diagnosis not present

## 2015-08-27 DIAGNOSIS — N2581 Secondary hyperparathyroidism of renal origin: Secondary | ICD-10-CM | POA: Diagnosis not present

## 2015-08-27 DIAGNOSIS — E119 Type 2 diabetes mellitus without complications: Secondary | ICD-10-CM | POA: Diagnosis not present

## 2015-08-27 DIAGNOSIS — N186 End stage renal disease: Secondary | ICD-10-CM | POA: Diagnosis not present

## 2015-08-30 DIAGNOSIS — N2581 Secondary hyperparathyroidism of renal origin: Secondary | ICD-10-CM | POA: Diagnosis not present

## 2015-08-30 DIAGNOSIS — E119 Type 2 diabetes mellitus without complications: Secondary | ICD-10-CM | POA: Diagnosis not present

## 2015-08-30 DIAGNOSIS — N186 End stage renal disease: Secondary | ICD-10-CM | POA: Diagnosis not present

## 2015-09-01 DIAGNOSIS — N186 End stage renal disease: Secondary | ICD-10-CM | POA: Diagnosis not present

## 2015-09-01 DIAGNOSIS — E119 Type 2 diabetes mellitus without complications: Secondary | ICD-10-CM | POA: Diagnosis not present

## 2015-09-01 DIAGNOSIS — N2581 Secondary hyperparathyroidism of renal origin: Secondary | ICD-10-CM | POA: Diagnosis not present

## 2015-09-03 DIAGNOSIS — E119 Type 2 diabetes mellitus without complications: Secondary | ICD-10-CM | POA: Diagnosis not present

## 2015-09-03 DIAGNOSIS — N186 End stage renal disease: Secondary | ICD-10-CM | POA: Diagnosis not present

## 2015-09-03 DIAGNOSIS — N2581 Secondary hyperparathyroidism of renal origin: Secondary | ICD-10-CM | POA: Diagnosis not present

## 2015-09-06 DIAGNOSIS — E119 Type 2 diabetes mellitus without complications: Secondary | ICD-10-CM | POA: Diagnosis not present

## 2015-09-06 DIAGNOSIS — N2581 Secondary hyperparathyroidism of renal origin: Secondary | ICD-10-CM | POA: Diagnosis not present

## 2015-09-06 DIAGNOSIS — N186 End stage renal disease: Secondary | ICD-10-CM | POA: Diagnosis not present

## 2015-09-08 DIAGNOSIS — N186 End stage renal disease: Secondary | ICD-10-CM | POA: Diagnosis not present

## 2015-09-08 DIAGNOSIS — T8612 Kidney transplant failure: Secondary | ICD-10-CM | POA: Diagnosis not present

## 2015-09-08 DIAGNOSIS — Z992 Dependence on renal dialysis: Secondary | ICD-10-CM | POA: Diagnosis not present

## 2015-09-08 DIAGNOSIS — E119 Type 2 diabetes mellitus without complications: Secondary | ICD-10-CM | POA: Diagnosis not present

## 2015-09-08 DIAGNOSIS — N2581 Secondary hyperparathyroidism of renal origin: Secondary | ICD-10-CM | POA: Diagnosis not present

## 2015-09-10 DIAGNOSIS — N2581 Secondary hyperparathyroidism of renal origin: Secondary | ICD-10-CM | POA: Diagnosis not present

## 2015-09-10 DIAGNOSIS — E119 Type 2 diabetes mellitus without complications: Secondary | ICD-10-CM | POA: Diagnosis not present

## 2015-09-10 DIAGNOSIS — Z23 Encounter for immunization: Secondary | ICD-10-CM | POA: Diagnosis not present

## 2015-09-10 DIAGNOSIS — N186 End stage renal disease: Secondary | ICD-10-CM | POA: Diagnosis not present

## 2015-09-10 DIAGNOSIS — D631 Anemia in chronic kidney disease: Secondary | ICD-10-CM | POA: Diagnosis not present

## 2015-09-13 DIAGNOSIS — N2581 Secondary hyperparathyroidism of renal origin: Secondary | ICD-10-CM | POA: Diagnosis not present

## 2015-09-13 DIAGNOSIS — Z23 Encounter for immunization: Secondary | ICD-10-CM | POA: Diagnosis not present

## 2015-09-13 DIAGNOSIS — D631 Anemia in chronic kidney disease: Secondary | ICD-10-CM | POA: Diagnosis not present

## 2015-09-13 DIAGNOSIS — E119 Type 2 diabetes mellitus without complications: Secondary | ICD-10-CM | POA: Diagnosis not present

## 2015-09-13 DIAGNOSIS — N186 End stage renal disease: Secondary | ICD-10-CM | POA: Diagnosis not present

## 2015-09-15 DIAGNOSIS — D631 Anemia in chronic kidney disease: Secondary | ICD-10-CM | POA: Diagnosis not present

## 2015-09-15 DIAGNOSIS — N186 End stage renal disease: Secondary | ICD-10-CM | POA: Diagnosis not present

## 2015-09-15 DIAGNOSIS — N2581 Secondary hyperparathyroidism of renal origin: Secondary | ICD-10-CM | POA: Diagnosis not present

## 2015-09-15 DIAGNOSIS — Z23 Encounter for immunization: Secondary | ICD-10-CM | POA: Diagnosis not present

## 2015-09-15 DIAGNOSIS — E119 Type 2 diabetes mellitus without complications: Secondary | ICD-10-CM | POA: Diagnosis not present

## 2015-09-17 DIAGNOSIS — D631 Anemia in chronic kidney disease: Secondary | ICD-10-CM | POA: Diagnosis not present

## 2015-09-17 DIAGNOSIS — Z23 Encounter for immunization: Secondary | ICD-10-CM | POA: Diagnosis not present

## 2015-09-17 DIAGNOSIS — E119 Type 2 diabetes mellitus without complications: Secondary | ICD-10-CM | POA: Diagnosis not present

## 2015-09-17 DIAGNOSIS — N186 End stage renal disease: Secondary | ICD-10-CM | POA: Diagnosis not present

## 2015-09-17 DIAGNOSIS — N2581 Secondary hyperparathyroidism of renal origin: Secondary | ICD-10-CM | POA: Diagnosis not present

## 2015-09-20 DIAGNOSIS — D631 Anemia in chronic kidney disease: Secondary | ICD-10-CM | POA: Diagnosis not present

## 2015-09-20 DIAGNOSIS — N186 End stage renal disease: Secondary | ICD-10-CM | POA: Diagnosis not present

## 2015-09-20 DIAGNOSIS — E119 Type 2 diabetes mellitus without complications: Secondary | ICD-10-CM | POA: Diagnosis not present

## 2015-09-20 DIAGNOSIS — N2581 Secondary hyperparathyroidism of renal origin: Secondary | ICD-10-CM | POA: Diagnosis not present

## 2015-09-20 DIAGNOSIS — Z23 Encounter for immunization: Secondary | ICD-10-CM | POA: Diagnosis not present

## 2015-09-22 DIAGNOSIS — N2581 Secondary hyperparathyroidism of renal origin: Secondary | ICD-10-CM | POA: Diagnosis not present

## 2015-09-22 DIAGNOSIS — N186 End stage renal disease: Secondary | ICD-10-CM | POA: Diagnosis not present

## 2015-09-22 DIAGNOSIS — E119 Type 2 diabetes mellitus without complications: Secondary | ICD-10-CM | POA: Diagnosis not present

## 2015-09-22 DIAGNOSIS — Z23 Encounter for immunization: Secondary | ICD-10-CM | POA: Diagnosis not present

## 2015-09-22 DIAGNOSIS — D631 Anemia in chronic kidney disease: Secondary | ICD-10-CM | POA: Diagnosis not present

## 2015-09-24 DIAGNOSIS — D631 Anemia in chronic kidney disease: Secondary | ICD-10-CM | POA: Diagnosis not present

## 2015-09-24 DIAGNOSIS — E119 Type 2 diabetes mellitus without complications: Secondary | ICD-10-CM | POA: Diagnosis not present

## 2015-09-24 DIAGNOSIS — N2581 Secondary hyperparathyroidism of renal origin: Secondary | ICD-10-CM | POA: Diagnosis not present

## 2015-09-24 DIAGNOSIS — N186 End stage renal disease: Secondary | ICD-10-CM | POA: Diagnosis not present

## 2015-09-24 DIAGNOSIS — Z23 Encounter for immunization: Secondary | ICD-10-CM | POA: Diagnosis not present

## 2015-09-27 DIAGNOSIS — D631 Anemia in chronic kidney disease: Secondary | ICD-10-CM | POA: Diagnosis not present

## 2015-09-27 DIAGNOSIS — N186 End stage renal disease: Secondary | ICD-10-CM | POA: Diagnosis not present

## 2015-09-27 DIAGNOSIS — N2581 Secondary hyperparathyroidism of renal origin: Secondary | ICD-10-CM | POA: Diagnosis not present

## 2015-09-27 DIAGNOSIS — E119 Type 2 diabetes mellitus without complications: Secondary | ICD-10-CM | POA: Diagnosis not present

## 2015-09-27 DIAGNOSIS — Z23 Encounter for immunization: Secondary | ICD-10-CM | POA: Diagnosis not present

## 2015-09-29 DIAGNOSIS — N186 End stage renal disease: Secondary | ICD-10-CM | POA: Diagnosis not present

## 2015-09-29 DIAGNOSIS — N2581 Secondary hyperparathyroidism of renal origin: Secondary | ICD-10-CM | POA: Diagnosis not present

## 2015-09-29 DIAGNOSIS — D631 Anemia in chronic kidney disease: Secondary | ICD-10-CM | POA: Diagnosis not present

## 2015-09-29 DIAGNOSIS — Z23 Encounter for immunization: Secondary | ICD-10-CM | POA: Diagnosis not present

## 2015-09-29 DIAGNOSIS — E119 Type 2 diabetes mellitus without complications: Secondary | ICD-10-CM | POA: Diagnosis not present

## 2015-10-01 DIAGNOSIS — N186 End stage renal disease: Secondary | ICD-10-CM | POA: Diagnosis not present

## 2015-10-01 DIAGNOSIS — D631 Anemia in chronic kidney disease: Secondary | ICD-10-CM | POA: Diagnosis not present

## 2015-10-01 DIAGNOSIS — E119 Type 2 diabetes mellitus without complications: Secondary | ICD-10-CM | POA: Diagnosis not present

## 2015-10-01 DIAGNOSIS — Z23 Encounter for immunization: Secondary | ICD-10-CM | POA: Diagnosis not present

## 2015-10-01 DIAGNOSIS — N2581 Secondary hyperparathyroidism of renal origin: Secondary | ICD-10-CM | POA: Diagnosis not present

## 2015-10-04 DIAGNOSIS — Z23 Encounter for immunization: Secondary | ICD-10-CM | POA: Diagnosis not present

## 2015-10-04 DIAGNOSIS — D631 Anemia in chronic kidney disease: Secondary | ICD-10-CM | POA: Diagnosis not present

## 2015-10-04 DIAGNOSIS — E119 Type 2 diabetes mellitus without complications: Secondary | ICD-10-CM | POA: Diagnosis not present

## 2015-10-04 DIAGNOSIS — N186 End stage renal disease: Secondary | ICD-10-CM | POA: Diagnosis not present

## 2015-10-04 DIAGNOSIS — N2581 Secondary hyperparathyroidism of renal origin: Secondary | ICD-10-CM | POA: Diagnosis not present

## 2015-10-06 DIAGNOSIS — N2581 Secondary hyperparathyroidism of renal origin: Secondary | ICD-10-CM | POA: Diagnosis not present

## 2015-10-06 DIAGNOSIS — E119 Type 2 diabetes mellitus without complications: Secondary | ICD-10-CM | POA: Diagnosis not present

## 2015-10-06 DIAGNOSIS — Z23 Encounter for immunization: Secondary | ICD-10-CM | POA: Diagnosis not present

## 2015-10-06 DIAGNOSIS — D631 Anemia in chronic kidney disease: Secondary | ICD-10-CM | POA: Diagnosis not present

## 2015-10-06 DIAGNOSIS — N186 End stage renal disease: Secondary | ICD-10-CM | POA: Diagnosis not present

## 2015-10-08 DIAGNOSIS — E119 Type 2 diabetes mellitus without complications: Secondary | ICD-10-CM | POA: Diagnosis not present

## 2015-10-08 DIAGNOSIS — N2581 Secondary hyperparathyroidism of renal origin: Secondary | ICD-10-CM | POA: Diagnosis not present

## 2015-10-08 DIAGNOSIS — Z992 Dependence on renal dialysis: Secondary | ICD-10-CM | POA: Diagnosis not present

## 2015-10-08 DIAGNOSIS — N186 End stage renal disease: Secondary | ICD-10-CM | POA: Diagnosis not present

## 2015-10-08 DIAGNOSIS — T8612 Kidney transplant failure: Secondary | ICD-10-CM | POA: Diagnosis not present

## 2015-10-08 DIAGNOSIS — D631 Anemia in chronic kidney disease: Secondary | ICD-10-CM | POA: Diagnosis not present

## 2015-10-08 DIAGNOSIS — Z23 Encounter for immunization: Secondary | ICD-10-CM | POA: Diagnosis not present

## 2015-10-11 DIAGNOSIS — N186 End stage renal disease: Secondary | ICD-10-CM | POA: Diagnosis not present

## 2015-10-11 DIAGNOSIS — N2581 Secondary hyperparathyroidism of renal origin: Secondary | ICD-10-CM | POA: Diagnosis not present

## 2015-10-13 DIAGNOSIS — N2581 Secondary hyperparathyroidism of renal origin: Secondary | ICD-10-CM | POA: Diagnosis not present

## 2015-10-13 DIAGNOSIS — N186 End stage renal disease: Secondary | ICD-10-CM | POA: Diagnosis not present

## 2015-10-15 DIAGNOSIS — N186 End stage renal disease: Secondary | ICD-10-CM | POA: Diagnosis not present

## 2015-10-15 DIAGNOSIS — N2581 Secondary hyperparathyroidism of renal origin: Secondary | ICD-10-CM | POA: Diagnosis not present

## 2015-10-18 DIAGNOSIS — N2581 Secondary hyperparathyroidism of renal origin: Secondary | ICD-10-CM | POA: Diagnosis not present

## 2015-10-18 DIAGNOSIS — N186 End stage renal disease: Secondary | ICD-10-CM | POA: Diagnosis not present

## 2015-10-20 DIAGNOSIS — N186 End stage renal disease: Secondary | ICD-10-CM | POA: Diagnosis not present

## 2015-10-20 DIAGNOSIS — N2581 Secondary hyperparathyroidism of renal origin: Secondary | ICD-10-CM | POA: Diagnosis not present

## 2015-10-22 DIAGNOSIS — N2581 Secondary hyperparathyroidism of renal origin: Secondary | ICD-10-CM | POA: Diagnosis not present

## 2015-10-22 DIAGNOSIS — N186 End stage renal disease: Secondary | ICD-10-CM | POA: Diagnosis not present

## 2015-10-25 DIAGNOSIS — N2581 Secondary hyperparathyroidism of renal origin: Secondary | ICD-10-CM | POA: Diagnosis not present

## 2015-10-25 DIAGNOSIS — N186 End stage renal disease: Secondary | ICD-10-CM | POA: Diagnosis not present

## 2015-10-27 DIAGNOSIS — N2581 Secondary hyperparathyroidism of renal origin: Secondary | ICD-10-CM | POA: Diagnosis not present

## 2015-10-27 DIAGNOSIS — N186 End stage renal disease: Secondary | ICD-10-CM | POA: Diagnosis not present

## 2015-10-29 DIAGNOSIS — N186 End stage renal disease: Secondary | ICD-10-CM | POA: Diagnosis not present

## 2015-10-29 DIAGNOSIS — N2581 Secondary hyperparathyroidism of renal origin: Secondary | ICD-10-CM | POA: Diagnosis not present

## 2015-11-01 DIAGNOSIS — N186 End stage renal disease: Secondary | ICD-10-CM | POA: Diagnosis not present

## 2015-11-01 DIAGNOSIS — N2581 Secondary hyperparathyroidism of renal origin: Secondary | ICD-10-CM | POA: Diagnosis not present

## 2015-11-03 DIAGNOSIS — N186 End stage renal disease: Secondary | ICD-10-CM | POA: Diagnosis not present

## 2015-11-03 DIAGNOSIS — E119 Type 2 diabetes mellitus without complications: Secondary | ICD-10-CM | POA: Diagnosis not present

## 2015-11-03 DIAGNOSIS — N2581 Secondary hyperparathyroidism of renal origin: Secondary | ICD-10-CM | POA: Diagnosis not present

## 2015-11-05 DIAGNOSIS — N2581 Secondary hyperparathyroidism of renal origin: Secondary | ICD-10-CM | POA: Diagnosis not present

## 2015-11-05 DIAGNOSIS — N186 End stage renal disease: Secondary | ICD-10-CM | POA: Diagnosis not present

## 2015-11-08 DIAGNOSIS — N186 End stage renal disease: Secondary | ICD-10-CM | POA: Diagnosis not present

## 2015-11-08 DIAGNOSIS — Z992 Dependence on renal dialysis: Secondary | ICD-10-CM | POA: Diagnosis not present

## 2015-11-08 DIAGNOSIS — T8612 Kidney transplant failure: Secondary | ICD-10-CM | POA: Diagnosis not present

## 2015-11-08 DIAGNOSIS — N2581 Secondary hyperparathyroidism of renal origin: Secondary | ICD-10-CM | POA: Diagnosis not present

## 2015-11-10 DIAGNOSIS — N2581 Secondary hyperparathyroidism of renal origin: Secondary | ICD-10-CM | POA: Diagnosis not present

## 2015-11-10 DIAGNOSIS — N186 End stage renal disease: Secondary | ICD-10-CM | POA: Diagnosis not present

## 2015-11-10 DIAGNOSIS — E119 Type 2 diabetes mellitus without complications: Secondary | ICD-10-CM | POA: Diagnosis not present

## 2015-11-12 DIAGNOSIS — N2581 Secondary hyperparathyroidism of renal origin: Secondary | ICD-10-CM | POA: Diagnosis not present

## 2015-11-12 DIAGNOSIS — N186 End stage renal disease: Secondary | ICD-10-CM | POA: Diagnosis not present

## 2015-11-12 DIAGNOSIS — E119 Type 2 diabetes mellitus without complications: Secondary | ICD-10-CM | POA: Diagnosis not present

## 2015-11-15 DIAGNOSIS — N2581 Secondary hyperparathyroidism of renal origin: Secondary | ICD-10-CM | POA: Diagnosis not present

## 2015-11-15 DIAGNOSIS — E119 Type 2 diabetes mellitus without complications: Secondary | ICD-10-CM | POA: Diagnosis not present

## 2015-11-15 DIAGNOSIS — N186 End stage renal disease: Secondary | ICD-10-CM | POA: Diagnosis not present

## 2015-11-17 DIAGNOSIS — E119 Type 2 diabetes mellitus without complications: Secondary | ICD-10-CM | POA: Diagnosis not present

## 2015-11-17 DIAGNOSIS — N186 End stage renal disease: Secondary | ICD-10-CM | POA: Diagnosis not present

## 2015-11-17 DIAGNOSIS — N2581 Secondary hyperparathyroidism of renal origin: Secondary | ICD-10-CM | POA: Diagnosis not present

## 2015-11-19 DIAGNOSIS — N2581 Secondary hyperparathyroidism of renal origin: Secondary | ICD-10-CM | POA: Diagnosis not present

## 2015-11-19 DIAGNOSIS — N186 End stage renal disease: Secondary | ICD-10-CM | POA: Diagnosis not present

## 2015-11-19 DIAGNOSIS — E119 Type 2 diabetes mellitus without complications: Secondary | ICD-10-CM | POA: Diagnosis not present

## 2015-11-22 DIAGNOSIS — N186 End stage renal disease: Secondary | ICD-10-CM | POA: Diagnosis not present

## 2015-11-22 DIAGNOSIS — E119 Type 2 diabetes mellitus without complications: Secondary | ICD-10-CM | POA: Diagnosis not present

## 2015-11-22 DIAGNOSIS — N2581 Secondary hyperparathyroidism of renal origin: Secondary | ICD-10-CM | POA: Diagnosis not present

## 2015-11-24 DIAGNOSIS — E119 Type 2 diabetes mellitus without complications: Secondary | ICD-10-CM | POA: Diagnosis not present

## 2015-11-24 DIAGNOSIS — N186 End stage renal disease: Secondary | ICD-10-CM | POA: Diagnosis not present

## 2015-11-24 DIAGNOSIS — N2581 Secondary hyperparathyroidism of renal origin: Secondary | ICD-10-CM | POA: Diagnosis not present

## 2015-11-26 DIAGNOSIS — N186 End stage renal disease: Secondary | ICD-10-CM | POA: Diagnosis not present

## 2015-11-26 DIAGNOSIS — N2581 Secondary hyperparathyroidism of renal origin: Secondary | ICD-10-CM | POA: Diagnosis not present

## 2015-11-26 DIAGNOSIS — E119 Type 2 diabetes mellitus without complications: Secondary | ICD-10-CM | POA: Diagnosis not present

## 2015-11-28 DIAGNOSIS — N2581 Secondary hyperparathyroidism of renal origin: Secondary | ICD-10-CM | POA: Diagnosis not present

## 2015-11-28 DIAGNOSIS — N186 End stage renal disease: Secondary | ICD-10-CM | POA: Diagnosis not present

## 2015-11-28 DIAGNOSIS — E119 Type 2 diabetes mellitus without complications: Secondary | ICD-10-CM | POA: Diagnosis not present

## 2015-11-30 DIAGNOSIS — N2581 Secondary hyperparathyroidism of renal origin: Secondary | ICD-10-CM | POA: Diagnosis not present

## 2015-11-30 DIAGNOSIS — E119 Type 2 diabetes mellitus without complications: Secondary | ICD-10-CM | POA: Diagnosis not present

## 2015-11-30 DIAGNOSIS — N186 End stage renal disease: Secondary | ICD-10-CM | POA: Diagnosis not present

## 2015-12-03 DIAGNOSIS — N2581 Secondary hyperparathyroidism of renal origin: Secondary | ICD-10-CM | POA: Diagnosis not present

## 2015-12-03 DIAGNOSIS — E119 Type 2 diabetes mellitus without complications: Secondary | ICD-10-CM | POA: Diagnosis not present

## 2015-12-03 DIAGNOSIS — N186 End stage renal disease: Secondary | ICD-10-CM | POA: Diagnosis not present

## 2015-12-06 DIAGNOSIS — N186 End stage renal disease: Secondary | ICD-10-CM | POA: Diagnosis not present

## 2015-12-06 DIAGNOSIS — E119 Type 2 diabetes mellitus without complications: Secondary | ICD-10-CM | POA: Diagnosis not present

## 2015-12-06 DIAGNOSIS — N2581 Secondary hyperparathyroidism of renal origin: Secondary | ICD-10-CM | POA: Diagnosis not present

## 2015-12-08 DIAGNOSIS — Z992 Dependence on renal dialysis: Secondary | ICD-10-CM | POA: Diagnosis not present

## 2015-12-08 DIAGNOSIS — N186 End stage renal disease: Secondary | ICD-10-CM | POA: Diagnosis not present

## 2015-12-08 DIAGNOSIS — T8612 Kidney transplant failure: Secondary | ICD-10-CM | POA: Diagnosis not present

## 2015-12-08 DIAGNOSIS — N2581 Secondary hyperparathyroidism of renal origin: Secondary | ICD-10-CM | POA: Diagnosis not present

## 2015-12-08 DIAGNOSIS — E119 Type 2 diabetes mellitus without complications: Secondary | ICD-10-CM | POA: Diagnosis not present

## 2015-12-10 DIAGNOSIS — D631 Anemia in chronic kidney disease: Secondary | ICD-10-CM | POA: Diagnosis not present

## 2015-12-10 DIAGNOSIS — N186 End stage renal disease: Secondary | ICD-10-CM | POA: Diagnosis not present

## 2015-12-10 DIAGNOSIS — E119 Type 2 diabetes mellitus without complications: Secondary | ICD-10-CM | POA: Diagnosis not present

## 2015-12-10 DIAGNOSIS — N2581 Secondary hyperparathyroidism of renal origin: Secondary | ICD-10-CM | POA: Diagnosis not present

## 2015-12-13 DIAGNOSIS — N186 End stage renal disease: Secondary | ICD-10-CM | POA: Diagnosis not present

## 2015-12-13 DIAGNOSIS — E119 Type 2 diabetes mellitus without complications: Secondary | ICD-10-CM | POA: Diagnosis not present

## 2015-12-13 DIAGNOSIS — N2581 Secondary hyperparathyroidism of renal origin: Secondary | ICD-10-CM | POA: Diagnosis not present

## 2015-12-13 DIAGNOSIS — D631 Anemia in chronic kidney disease: Secondary | ICD-10-CM | POA: Diagnosis not present

## 2015-12-15 DIAGNOSIS — E119 Type 2 diabetes mellitus without complications: Secondary | ICD-10-CM | POA: Diagnosis not present

## 2015-12-15 DIAGNOSIS — N186 End stage renal disease: Secondary | ICD-10-CM | POA: Diagnosis not present

## 2015-12-15 DIAGNOSIS — N2581 Secondary hyperparathyroidism of renal origin: Secondary | ICD-10-CM | POA: Diagnosis not present

## 2015-12-15 DIAGNOSIS — D631 Anemia in chronic kidney disease: Secondary | ICD-10-CM | POA: Diagnosis not present

## 2015-12-17 DIAGNOSIS — E119 Type 2 diabetes mellitus without complications: Secondary | ICD-10-CM | POA: Diagnosis not present

## 2015-12-17 DIAGNOSIS — N186 End stage renal disease: Secondary | ICD-10-CM | POA: Diagnosis not present

## 2015-12-17 DIAGNOSIS — N2581 Secondary hyperparathyroidism of renal origin: Secondary | ICD-10-CM | POA: Diagnosis not present

## 2015-12-17 DIAGNOSIS — D631 Anemia in chronic kidney disease: Secondary | ICD-10-CM | POA: Diagnosis not present

## 2015-12-20 DIAGNOSIS — E119 Type 2 diabetes mellitus without complications: Secondary | ICD-10-CM | POA: Diagnosis not present

## 2015-12-20 DIAGNOSIS — D631 Anemia in chronic kidney disease: Secondary | ICD-10-CM | POA: Diagnosis not present

## 2015-12-20 DIAGNOSIS — N2581 Secondary hyperparathyroidism of renal origin: Secondary | ICD-10-CM | POA: Diagnosis not present

## 2015-12-20 DIAGNOSIS — N186 End stage renal disease: Secondary | ICD-10-CM | POA: Diagnosis not present

## 2015-12-22 DIAGNOSIS — N186 End stage renal disease: Secondary | ICD-10-CM | POA: Diagnosis not present

## 2015-12-22 DIAGNOSIS — D631 Anemia in chronic kidney disease: Secondary | ICD-10-CM | POA: Diagnosis not present

## 2015-12-22 DIAGNOSIS — N2581 Secondary hyperparathyroidism of renal origin: Secondary | ICD-10-CM | POA: Diagnosis not present

## 2015-12-22 DIAGNOSIS — E119 Type 2 diabetes mellitus without complications: Secondary | ICD-10-CM | POA: Diagnosis not present

## 2015-12-24 DIAGNOSIS — N186 End stage renal disease: Secondary | ICD-10-CM | POA: Diagnosis not present

## 2015-12-24 DIAGNOSIS — D631 Anemia in chronic kidney disease: Secondary | ICD-10-CM | POA: Diagnosis not present

## 2015-12-24 DIAGNOSIS — N2581 Secondary hyperparathyroidism of renal origin: Secondary | ICD-10-CM | POA: Diagnosis not present

## 2015-12-24 DIAGNOSIS — E119 Type 2 diabetes mellitus without complications: Secondary | ICD-10-CM | POA: Diagnosis not present

## 2015-12-27 DIAGNOSIS — E119 Type 2 diabetes mellitus without complications: Secondary | ICD-10-CM | POA: Diagnosis not present

## 2015-12-27 DIAGNOSIS — N186 End stage renal disease: Secondary | ICD-10-CM | POA: Diagnosis not present

## 2015-12-27 DIAGNOSIS — D631 Anemia in chronic kidney disease: Secondary | ICD-10-CM | POA: Diagnosis not present

## 2015-12-27 DIAGNOSIS — N2581 Secondary hyperparathyroidism of renal origin: Secondary | ICD-10-CM | POA: Diagnosis not present

## 2015-12-29 DIAGNOSIS — E119 Type 2 diabetes mellitus without complications: Secondary | ICD-10-CM | POA: Diagnosis not present

## 2015-12-29 DIAGNOSIS — N2581 Secondary hyperparathyroidism of renal origin: Secondary | ICD-10-CM | POA: Diagnosis not present

## 2015-12-29 DIAGNOSIS — N186 End stage renal disease: Secondary | ICD-10-CM | POA: Diagnosis not present

## 2015-12-29 DIAGNOSIS — D631 Anemia in chronic kidney disease: Secondary | ICD-10-CM | POA: Diagnosis not present

## 2015-12-31 DIAGNOSIS — N186 End stage renal disease: Secondary | ICD-10-CM | POA: Diagnosis not present

## 2015-12-31 DIAGNOSIS — E119 Type 2 diabetes mellitus without complications: Secondary | ICD-10-CM | POA: Diagnosis not present

## 2015-12-31 DIAGNOSIS — N2581 Secondary hyperparathyroidism of renal origin: Secondary | ICD-10-CM | POA: Diagnosis not present

## 2015-12-31 DIAGNOSIS — D631 Anemia in chronic kidney disease: Secondary | ICD-10-CM | POA: Diagnosis not present

## 2016-01-03 DIAGNOSIS — N186 End stage renal disease: Secondary | ICD-10-CM | POA: Diagnosis not present

## 2016-01-03 DIAGNOSIS — E119 Type 2 diabetes mellitus without complications: Secondary | ICD-10-CM | POA: Diagnosis not present

## 2016-01-03 DIAGNOSIS — D631 Anemia in chronic kidney disease: Secondary | ICD-10-CM | POA: Diagnosis not present

## 2016-01-03 DIAGNOSIS — N2581 Secondary hyperparathyroidism of renal origin: Secondary | ICD-10-CM | POA: Diagnosis not present

## 2016-01-05 DIAGNOSIS — N186 End stage renal disease: Secondary | ICD-10-CM | POA: Diagnosis not present

## 2016-01-05 DIAGNOSIS — D631 Anemia in chronic kidney disease: Secondary | ICD-10-CM | POA: Diagnosis not present

## 2016-01-05 DIAGNOSIS — N2581 Secondary hyperparathyroidism of renal origin: Secondary | ICD-10-CM | POA: Diagnosis not present

## 2016-01-05 DIAGNOSIS — E119 Type 2 diabetes mellitus without complications: Secondary | ICD-10-CM | POA: Diagnosis not present

## 2016-01-07 DIAGNOSIS — N186 End stage renal disease: Secondary | ICD-10-CM | POA: Diagnosis not present

## 2016-01-07 DIAGNOSIS — N2581 Secondary hyperparathyroidism of renal origin: Secondary | ICD-10-CM | POA: Diagnosis not present

## 2016-01-07 DIAGNOSIS — D631 Anemia in chronic kidney disease: Secondary | ICD-10-CM | POA: Diagnosis not present

## 2016-01-07 DIAGNOSIS — E119 Type 2 diabetes mellitus without complications: Secondary | ICD-10-CM | POA: Diagnosis not present

## 2016-01-08 DIAGNOSIS — Z992 Dependence on renal dialysis: Secondary | ICD-10-CM | POA: Diagnosis not present

## 2016-01-08 DIAGNOSIS — T8612 Kidney transplant failure: Secondary | ICD-10-CM | POA: Diagnosis not present

## 2016-01-08 DIAGNOSIS — N186 End stage renal disease: Secondary | ICD-10-CM | POA: Diagnosis not present

## 2016-01-10 DIAGNOSIS — E119 Type 2 diabetes mellitus without complications: Secondary | ICD-10-CM | POA: Diagnosis not present

## 2016-01-10 DIAGNOSIS — N2581 Secondary hyperparathyroidism of renal origin: Secondary | ICD-10-CM | POA: Diagnosis not present

## 2016-01-10 DIAGNOSIS — N186 End stage renal disease: Secondary | ICD-10-CM | POA: Diagnosis not present

## 2016-01-10 DIAGNOSIS — D631 Anemia in chronic kidney disease: Secondary | ICD-10-CM | POA: Diagnosis not present

## 2016-01-12 DIAGNOSIS — D631 Anemia in chronic kidney disease: Secondary | ICD-10-CM | POA: Diagnosis not present

## 2016-01-12 DIAGNOSIS — N2581 Secondary hyperparathyroidism of renal origin: Secondary | ICD-10-CM | POA: Diagnosis not present

## 2016-01-12 DIAGNOSIS — N186 End stage renal disease: Secondary | ICD-10-CM | POA: Diagnosis not present

## 2016-01-12 DIAGNOSIS — E119 Type 2 diabetes mellitus without complications: Secondary | ICD-10-CM | POA: Diagnosis not present

## 2016-01-14 DIAGNOSIS — N2581 Secondary hyperparathyroidism of renal origin: Secondary | ICD-10-CM | POA: Diagnosis not present

## 2016-01-14 DIAGNOSIS — N186 End stage renal disease: Secondary | ICD-10-CM | POA: Diagnosis not present

## 2016-01-14 DIAGNOSIS — D631 Anemia in chronic kidney disease: Secondary | ICD-10-CM | POA: Diagnosis not present

## 2016-01-14 DIAGNOSIS — E119 Type 2 diabetes mellitus without complications: Secondary | ICD-10-CM | POA: Diagnosis not present

## 2016-01-17 DIAGNOSIS — N186 End stage renal disease: Secondary | ICD-10-CM | POA: Diagnosis not present

## 2016-01-17 DIAGNOSIS — D631 Anemia in chronic kidney disease: Secondary | ICD-10-CM | POA: Diagnosis not present

## 2016-01-17 DIAGNOSIS — E119 Type 2 diabetes mellitus without complications: Secondary | ICD-10-CM | POA: Diagnosis not present

## 2016-01-17 DIAGNOSIS — N2581 Secondary hyperparathyroidism of renal origin: Secondary | ICD-10-CM | POA: Diagnosis not present

## 2016-01-19 DIAGNOSIS — E119 Type 2 diabetes mellitus without complications: Secondary | ICD-10-CM | POA: Diagnosis not present

## 2016-01-19 DIAGNOSIS — D631 Anemia in chronic kidney disease: Secondary | ICD-10-CM | POA: Diagnosis not present

## 2016-01-19 DIAGNOSIS — N186 End stage renal disease: Secondary | ICD-10-CM | POA: Diagnosis not present

## 2016-01-19 DIAGNOSIS — N2581 Secondary hyperparathyroidism of renal origin: Secondary | ICD-10-CM | POA: Diagnosis not present

## 2016-01-20 DIAGNOSIS — N2581 Secondary hyperparathyroidism of renal origin: Secondary | ICD-10-CM | POA: Diagnosis not present

## 2016-01-20 DIAGNOSIS — N186 End stage renal disease: Secondary | ICD-10-CM | POA: Diagnosis not present

## 2016-01-20 DIAGNOSIS — E119 Type 2 diabetes mellitus without complications: Secondary | ICD-10-CM | POA: Diagnosis not present

## 2016-01-20 DIAGNOSIS — D631 Anemia in chronic kidney disease: Secondary | ICD-10-CM | POA: Diagnosis not present

## 2016-01-23 ENCOUNTER — Telehealth: Payer: Self-pay | Admitting: Hematology

## 2016-01-23 NOTE — Telephone Encounter (Signed)
RN from pt kidney doctor called to r/s pt missed appt from 07/2015. Gave RN new appt date/time

## 2016-01-24 DIAGNOSIS — N2581 Secondary hyperparathyroidism of renal origin: Secondary | ICD-10-CM | POA: Diagnosis not present

## 2016-01-24 DIAGNOSIS — N186 End stage renal disease: Secondary | ICD-10-CM | POA: Diagnosis not present

## 2016-01-24 DIAGNOSIS — D631 Anemia in chronic kidney disease: Secondary | ICD-10-CM | POA: Diagnosis not present

## 2016-01-24 DIAGNOSIS — E119 Type 2 diabetes mellitus without complications: Secondary | ICD-10-CM | POA: Diagnosis not present

## 2016-01-26 DIAGNOSIS — E119 Type 2 diabetes mellitus without complications: Secondary | ICD-10-CM | POA: Diagnosis not present

## 2016-01-26 DIAGNOSIS — D631 Anemia in chronic kidney disease: Secondary | ICD-10-CM | POA: Diagnosis not present

## 2016-01-26 DIAGNOSIS — N186 End stage renal disease: Secondary | ICD-10-CM | POA: Diagnosis not present

## 2016-01-26 DIAGNOSIS — N2581 Secondary hyperparathyroidism of renal origin: Secondary | ICD-10-CM | POA: Diagnosis not present

## 2016-01-28 DIAGNOSIS — N186 End stage renal disease: Secondary | ICD-10-CM | POA: Diagnosis not present

## 2016-01-28 DIAGNOSIS — E119 Type 2 diabetes mellitus without complications: Secondary | ICD-10-CM | POA: Diagnosis not present

## 2016-01-28 DIAGNOSIS — D631 Anemia in chronic kidney disease: Secondary | ICD-10-CM | POA: Diagnosis not present

## 2016-01-28 DIAGNOSIS — N2581 Secondary hyperparathyroidism of renal origin: Secondary | ICD-10-CM | POA: Diagnosis not present

## 2016-01-31 DIAGNOSIS — N2581 Secondary hyperparathyroidism of renal origin: Secondary | ICD-10-CM | POA: Diagnosis not present

## 2016-01-31 DIAGNOSIS — D631 Anemia in chronic kidney disease: Secondary | ICD-10-CM | POA: Diagnosis not present

## 2016-01-31 DIAGNOSIS — N186 End stage renal disease: Secondary | ICD-10-CM | POA: Diagnosis not present

## 2016-01-31 DIAGNOSIS — E119 Type 2 diabetes mellitus without complications: Secondary | ICD-10-CM | POA: Diagnosis not present

## 2016-02-02 DIAGNOSIS — N2581 Secondary hyperparathyroidism of renal origin: Secondary | ICD-10-CM | POA: Diagnosis not present

## 2016-02-02 DIAGNOSIS — E119 Type 2 diabetes mellitus without complications: Secondary | ICD-10-CM | POA: Diagnosis not present

## 2016-02-02 DIAGNOSIS — D631 Anemia in chronic kidney disease: Secondary | ICD-10-CM | POA: Diagnosis not present

## 2016-02-02 DIAGNOSIS — N186 End stage renal disease: Secondary | ICD-10-CM | POA: Diagnosis not present

## 2016-02-04 DIAGNOSIS — N2581 Secondary hyperparathyroidism of renal origin: Secondary | ICD-10-CM | POA: Diagnosis not present

## 2016-02-04 DIAGNOSIS — N186 End stage renal disease: Secondary | ICD-10-CM | POA: Diagnosis not present

## 2016-02-04 DIAGNOSIS — D631 Anemia in chronic kidney disease: Secondary | ICD-10-CM | POA: Diagnosis not present

## 2016-02-04 DIAGNOSIS — E119 Type 2 diabetes mellitus without complications: Secondary | ICD-10-CM | POA: Diagnosis not present

## 2016-02-07 DIAGNOSIS — N186 End stage renal disease: Secondary | ICD-10-CM | POA: Diagnosis not present

## 2016-02-07 DIAGNOSIS — D631 Anemia in chronic kidney disease: Secondary | ICD-10-CM | POA: Diagnosis not present

## 2016-02-07 DIAGNOSIS — E119 Type 2 diabetes mellitus without complications: Secondary | ICD-10-CM | POA: Diagnosis not present

## 2016-02-07 DIAGNOSIS — N2581 Secondary hyperparathyroidism of renal origin: Secondary | ICD-10-CM | POA: Diagnosis not present

## 2016-02-08 DIAGNOSIS — N186 End stage renal disease: Secondary | ICD-10-CM | POA: Diagnosis not present

## 2016-02-08 DIAGNOSIS — Z992 Dependence on renal dialysis: Secondary | ICD-10-CM | POA: Diagnosis not present

## 2016-02-08 DIAGNOSIS — T8612 Kidney transplant failure: Secondary | ICD-10-CM | POA: Diagnosis not present

## 2016-02-09 DIAGNOSIS — N2581 Secondary hyperparathyroidism of renal origin: Secondary | ICD-10-CM | POA: Diagnosis not present

## 2016-02-09 DIAGNOSIS — N186 End stage renal disease: Secondary | ICD-10-CM | POA: Diagnosis not present

## 2016-02-09 DIAGNOSIS — E119 Type 2 diabetes mellitus without complications: Secondary | ICD-10-CM | POA: Diagnosis not present

## 2016-02-11 DIAGNOSIS — N186 End stage renal disease: Secondary | ICD-10-CM | POA: Diagnosis not present

## 2016-02-11 DIAGNOSIS — E119 Type 2 diabetes mellitus without complications: Secondary | ICD-10-CM | POA: Diagnosis not present

## 2016-02-11 DIAGNOSIS — N2581 Secondary hyperparathyroidism of renal origin: Secondary | ICD-10-CM | POA: Diagnosis not present

## 2016-02-13 ENCOUNTER — Other Ambulatory Visit: Payer: Medicare Other

## 2016-02-13 ENCOUNTER — Ambulatory Visit: Payer: Medicare Other | Admitting: Hematology

## 2016-02-14 DIAGNOSIS — E119 Type 2 diabetes mellitus without complications: Secondary | ICD-10-CM | POA: Diagnosis not present

## 2016-02-14 DIAGNOSIS — N186 End stage renal disease: Secondary | ICD-10-CM | POA: Diagnosis not present

## 2016-02-14 DIAGNOSIS — N2581 Secondary hyperparathyroidism of renal origin: Secondary | ICD-10-CM | POA: Diagnosis not present

## 2016-02-16 DIAGNOSIS — N186 End stage renal disease: Secondary | ICD-10-CM | POA: Diagnosis not present

## 2016-02-16 DIAGNOSIS — E119 Type 2 diabetes mellitus without complications: Secondary | ICD-10-CM | POA: Diagnosis not present

## 2016-02-16 DIAGNOSIS — N2581 Secondary hyperparathyroidism of renal origin: Secondary | ICD-10-CM | POA: Diagnosis not present

## 2016-02-18 DIAGNOSIS — N186 End stage renal disease: Secondary | ICD-10-CM | POA: Diagnosis not present

## 2016-02-18 DIAGNOSIS — E119 Type 2 diabetes mellitus without complications: Secondary | ICD-10-CM | POA: Diagnosis not present

## 2016-02-18 DIAGNOSIS — N2581 Secondary hyperparathyroidism of renal origin: Secondary | ICD-10-CM | POA: Diagnosis not present

## 2016-02-21 DIAGNOSIS — E119 Type 2 diabetes mellitus without complications: Secondary | ICD-10-CM | POA: Diagnosis not present

## 2016-02-21 DIAGNOSIS — N2581 Secondary hyperparathyroidism of renal origin: Secondary | ICD-10-CM | POA: Diagnosis not present

## 2016-02-21 DIAGNOSIS — N186 End stage renal disease: Secondary | ICD-10-CM | POA: Diagnosis not present

## 2016-02-23 DIAGNOSIS — N2581 Secondary hyperparathyroidism of renal origin: Secondary | ICD-10-CM | POA: Diagnosis not present

## 2016-02-23 DIAGNOSIS — E119 Type 2 diabetes mellitus without complications: Secondary | ICD-10-CM | POA: Diagnosis not present

## 2016-02-23 DIAGNOSIS — N186 End stage renal disease: Secondary | ICD-10-CM | POA: Diagnosis not present

## 2016-02-25 DIAGNOSIS — N2581 Secondary hyperparathyroidism of renal origin: Secondary | ICD-10-CM | POA: Diagnosis not present

## 2016-02-25 DIAGNOSIS — N186 End stage renal disease: Secondary | ICD-10-CM | POA: Diagnosis not present

## 2016-02-25 DIAGNOSIS — E119 Type 2 diabetes mellitus without complications: Secondary | ICD-10-CM | POA: Diagnosis not present

## 2016-02-28 DIAGNOSIS — N186 End stage renal disease: Secondary | ICD-10-CM | POA: Diagnosis not present

## 2016-02-28 DIAGNOSIS — E119 Type 2 diabetes mellitus without complications: Secondary | ICD-10-CM | POA: Diagnosis not present

## 2016-02-28 DIAGNOSIS — N2581 Secondary hyperparathyroidism of renal origin: Secondary | ICD-10-CM | POA: Diagnosis not present

## 2016-03-01 DIAGNOSIS — N2581 Secondary hyperparathyroidism of renal origin: Secondary | ICD-10-CM | POA: Diagnosis not present

## 2016-03-01 DIAGNOSIS — N186 End stage renal disease: Secondary | ICD-10-CM | POA: Diagnosis not present

## 2016-03-01 DIAGNOSIS — E119 Type 2 diabetes mellitus without complications: Secondary | ICD-10-CM | POA: Diagnosis not present

## 2016-03-03 DIAGNOSIS — N186 End stage renal disease: Secondary | ICD-10-CM | POA: Diagnosis not present

## 2016-03-03 DIAGNOSIS — E119 Type 2 diabetes mellitus without complications: Secondary | ICD-10-CM | POA: Diagnosis not present

## 2016-03-03 DIAGNOSIS — N2581 Secondary hyperparathyroidism of renal origin: Secondary | ICD-10-CM | POA: Diagnosis not present

## 2016-03-06 DIAGNOSIS — N186 End stage renal disease: Secondary | ICD-10-CM | POA: Diagnosis not present

## 2016-03-06 DIAGNOSIS — E119 Type 2 diabetes mellitus without complications: Secondary | ICD-10-CM | POA: Diagnosis not present

## 2016-03-06 DIAGNOSIS — N2581 Secondary hyperparathyroidism of renal origin: Secondary | ICD-10-CM | POA: Diagnosis not present

## 2016-03-07 DIAGNOSIS — T8612 Kidney transplant failure: Secondary | ICD-10-CM | POA: Diagnosis not present

## 2016-03-07 DIAGNOSIS — Z992 Dependence on renal dialysis: Secondary | ICD-10-CM | POA: Diagnosis not present

## 2016-03-07 DIAGNOSIS — N186 End stage renal disease: Secondary | ICD-10-CM | POA: Diagnosis not present

## 2016-03-08 DIAGNOSIS — N186 End stage renal disease: Secondary | ICD-10-CM | POA: Diagnosis not present

## 2016-03-08 DIAGNOSIS — D631 Anemia in chronic kidney disease: Secondary | ICD-10-CM | POA: Diagnosis not present

## 2016-03-08 DIAGNOSIS — N2581 Secondary hyperparathyroidism of renal origin: Secondary | ICD-10-CM | POA: Diagnosis not present

## 2016-03-08 DIAGNOSIS — E119 Type 2 diabetes mellitus without complications: Secondary | ICD-10-CM | POA: Diagnosis not present

## 2016-03-10 DIAGNOSIS — E119 Type 2 diabetes mellitus without complications: Secondary | ICD-10-CM | POA: Diagnosis not present

## 2016-03-10 DIAGNOSIS — N186 End stage renal disease: Secondary | ICD-10-CM | POA: Diagnosis not present

## 2016-03-10 DIAGNOSIS — D631 Anemia in chronic kidney disease: Secondary | ICD-10-CM | POA: Diagnosis not present

## 2016-03-10 DIAGNOSIS — N2581 Secondary hyperparathyroidism of renal origin: Secondary | ICD-10-CM | POA: Diagnosis not present

## 2016-03-13 DIAGNOSIS — E119 Type 2 diabetes mellitus without complications: Secondary | ICD-10-CM | POA: Diagnosis not present

## 2016-03-13 DIAGNOSIS — N186 End stage renal disease: Secondary | ICD-10-CM | POA: Diagnosis not present

## 2016-03-13 DIAGNOSIS — N2581 Secondary hyperparathyroidism of renal origin: Secondary | ICD-10-CM | POA: Diagnosis not present

## 2016-03-13 DIAGNOSIS — D631 Anemia in chronic kidney disease: Secondary | ICD-10-CM | POA: Diagnosis not present

## 2016-03-15 DIAGNOSIS — D631 Anemia in chronic kidney disease: Secondary | ICD-10-CM | POA: Diagnosis not present

## 2016-03-15 DIAGNOSIS — E119 Type 2 diabetes mellitus without complications: Secondary | ICD-10-CM | POA: Diagnosis not present

## 2016-03-15 DIAGNOSIS — N2581 Secondary hyperparathyroidism of renal origin: Secondary | ICD-10-CM | POA: Diagnosis not present

## 2016-03-15 DIAGNOSIS — N186 End stage renal disease: Secondary | ICD-10-CM | POA: Diagnosis not present

## 2016-03-17 DIAGNOSIS — N2581 Secondary hyperparathyroidism of renal origin: Secondary | ICD-10-CM | POA: Diagnosis not present

## 2016-03-17 DIAGNOSIS — D631 Anemia in chronic kidney disease: Secondary | ICD-10-CM | POA: Diagnosis not present

## 2016-03-17 DIAGNOSIS — E119 Type 2 diabetes mellitus without complications: Secondary | ICD-10-CM | POA: Diagnosis not present

## 2016-03-17 DIAGNOSIS — N186 End stage renal disease: Secondary | ICD-10-CM | POA: Diagnosis not present

## 2016-03-20 DIAGNOSIS — N2581 Secondary hyperparathyroidism of renal origin: Secondary | ICD-10-CM | POA: Diagnosis not present

## 2016-03-20 DIAGNOSIS — D631 Anemia in chronic kidney disease: Secondary | ICD-10-CM | POA: Diagnosis not present

## 2016-03-20 DIAGNOSIS — E119 Type 2 diabetes mellitus without complications: Secondary | ICD-10-CM | POA: Diagnosis not present

## 2016-03-20 DIAGNOSIS — N186 End stage renal disease: Secondary | ICD-10-CM | POA: Diagnosis not present

## 2016-03-22 DIAGNOSIS — N2581 Secondary hyperparathyroidism of renal origin: Secondary | ICD-10-CM | POA: Diagnosis not present

## 2016-03-22 DIAGNOSIS — D631 Anemia in chronic kidney disease: Secondary | ICD-10-CM | POA: Diagnosis not present

## 2016-03-22 DIAGNOSIS — N186 End stage renal disease: Secondary | ICD-10-CM | POA: Diagnosis not present

## 2016-03-22 DIAGNOSIS — E119 Type 2 diabetes mellitus without complications: Secondary | ICD-10-CM | POA: Diagnosis not present

## 2016-03-24 DIAGNOSIS — N186 End stage renal disease: Secondary | ICD-10-CM | POA: Diagnosis not present

## 2016-03-24 DIAGNOSIS — N2581 Secondary hyperparathyroidism of renal origin: Secondary | ICD-10-CM | POA: Diagnosis not present

## 2016-03-24 DIAGNOSIS — E119 Type 2 diabetes mellitus without complications: Secondary | ICD-10-CM | POA: Diagnosis not present

## 2016-03-24 DIAGNOSIS — D631 Anemia in chronic kidney disease: Secondary | ICD-10-CM | POA: Diagnosis not present

## 2016-03-27 DIAGNOSIS — E119 Type 2 diabetes mellitus without complications: Secondary | ICD-10-CM | POA: Diagnosis not present

## 2016-03-27 DIAGNOSIS — D631 Anemia in chronic kidney disease: Secondary | ICD-10-CM | POA: Diagnosis not present

## 2016-03-27 DIAGNOSIS — N2581 Secondary hyperparathyroidism of renal origin: Secondary | ICD-10-CM | POA: Diagnosis not present

## 2016-03-27 DIAGNOSIS — N186 End stage renal disease: Secondary | ICD-10-CM | POA: Diagnosis not present

## 2016-03-29 DIAGNOSIS — E119 Type 2 diabetes mellitus without complications: Secondary | ICD-10-CM | POA: Diagnosis not present

## 2016-03-29 DIAGNOSIS — N186 End stage renal disease: Secondary | ICD-10-CM | POA: Diagnosis not present

## 2016-03-29 DIAGNOSIS — N2581 Secondary hyperparathyroidism of renal origin: Secondary | ICD-10-CM | POA: Diagnosis not present

## 2016-03-29 DIAGNOSIS — D631 Anemia in chronic kidney disease: Secondary | ICD-10-CM | POA: Diagnosis not present

## 2016-03-31 DIAGNOSIS — N2581 Secondary hyperparathyroidism of renal origin: Secondary | ICD-10-CM | POA: Diagnosis not present

## 2016-03-31 DIAGNOSIS — E119 Type 2 diabetes mellitus without complications: Secondary | ICD-10-CM | POA: Diagnosis not present

## 2016-03-31 DIAGNOSIS — N186 End stage renal disease: Secondary | ICD-10-CM | POA: Diagnosis not present

## 2016-03-31 DIAGNOSIS — D631 Anemia in chronic kidney disease: Secondary | ICD-10-CM | POA: Diagnosis not present

## 2016-04-03 DIAGNOSIS — N186 End stage renal disease: Secondary | ICD-10-CM | POA: Diagnosis not present

## 2016-04-03 DIAGNOSIS — N2581 Secondary hyperparathyroidism of renal origin: Secondary | ICD-10-CM | POA: Diagnosis not present

## 2016-04-03 DIAGNOSIS — E119 Type 2 diabetes mellitus without complications: Secondary | ICD-10-CM | POA: Diagnosis not present

## 2016-04-03 DIAGNOSIS — D631 Anemia in chronic kidney disease: Secondary | ICD-10-CM | POA: Diagnosis not present

## 2016-04-05 DIAGNOSIS — D631 Anemia in chronic kidney disease: Secondary | ICD-10-CM | POA: Diagnosis not present

## 2016-04-05 DIAGNOSIS — N2581 Secondary hyperparathyroidism of renal origin: Secondary | ICD-10-CM | POA: Diagnosis not present

## 2016-04-05 DIAGNOSIS — E119 Type 2 diabetes mellitus without complications: Secondary | ICD-10-CM | POA: Diagnosis not present

## 2016-04-05 DIAGNOSIS — N186 End stage renal disease: Secondary | ICD-10-CM | POA: Diagnosis not present

## 2016-04-07 DIAGNOSIS — D631 Anemia in chronic kidney disease: Secondary | ICD-10-CM | POA: Diagnosis not present

## 2016-04-07 DIAGNOSIS — N186 End stage renal disease: Secondary | ICD-10-CM | POA: Diagnosis not present

## 2016-04-07 DIAGNOSIS — Z992 Dependence on renal dialysis: Secondary | ICD-10-CM | POA: Diagnosis not present

## 2016-04-07 DIAGNOSIS — N2581 Secondary hyperparathyroidism of renal origin: Secondary | ICD-10-CM | POA: Diagnosis not present

## 2016-04-07 DIAGNOSIS — E119 Type 2 diabetes mellitus without complications: Secondary | ICD-10-CM | POA: Diagnosis not present

## 2016-04-07 DIAGNOSIS — T8612 Kidney transplant failure: Secondary | ICD-10-CM | POA: Diagnosis not present

## 2016-04-10 DIAGNOSIS — N2581 Secondary hyperparathyroidism of renal origin: Secondary | ICD-10-CM | POA: Diagnosis not present

## 2016-04-10 DIAGNOSIS — N186 End stage renal disease: Secondary | ICD-10-CM | POA: Diagnosis not present

## 2016-04-10 DIAGNOSIS — E119 Type 2 diabetes mellitus without complications: Secondary | ICD-10-CM | POA: Diagnosis not present

## 2016-04-12 DIAGNOSIS — N186 End stage renal disease: Secondary | ICD-10-CM | POA: Diagnosis not present

## 2016-04-12 DIAGNOSIS — E119 Type 2 diabetes mellitus without complications: Secondary | ICD-10-CM | POA: Diagnosis not present

## 2016-04-12 DIAGNOSIS — N2581 Secondary hyperparathyroidism of renal origin: Secondary | ICD-10-CM | POA: Diagnosis not present

## 2016-04-14 DIAGNOSIS — E119 Type 2 diabetes mellitus without complications: Secondary | ICD-10-CM | POA: Diagnosis not present

## 2016-04-14 DIAGNOSIS — N2581 Secondary hyperparathyroidism of renal origin: Secondary | ICD-10-CM | POA: Diagnosis not present

## 2016-04-14 DIAGNOSIS — N186 End stage renal disease: Secondary | ICD-10-CM | POA: Diagnosis not present

## 2016-04-17 DIAGNOSIS — E119 Type 2 diabetes mellitus without complications: Secondary | ICD-10-CM | POA: Diagnosis not present

## 2016-04-17 DIAGNOSIS — N186 End stage renal disease: Secondary | ICD-10-CM | POA: Diagnosis not present

## 2016-04-17 DIAGNOSIS — N2581 Secondary hyperparathyroidism of renal origin: Secondary | ICD-10-CM | POA: Diagnosis not present

## 2016-04-19 DIAGNOSIS — N2581 Secondary hyperparathyroidism of renal origin: Secondary | ICD-10-CM | POA: Diagnosis not present

## 2016-04-19 DIAGNOSIS — N186 End stage renal disease: Secondary | ICD-10-CM | POA: Diagnosis not present

## 2016-04-19 DIAGNOSIS — E119 Type 2 diabetes mellitus without complications: Secondary | ICD-10-CM | POA: Diagnosis not present

## 2016-04-21 DIAGNOSIS — N2581 Secondary hyperparathyroidism of renal origin: Secondary | ICD-10-CM | POA: Diagnosis not present

## 2016-04-21 DIAGNOSIS — E119 Type 2 diabetes mellitus without complications: Secondary | ICD-10-CM | POA: Diagnosis not present

## 2016-04-21 DIAGNOSIS — N186 End stage renal disease: Secondary | ICD-10-CM | POA: Diagnosis not present

## 2016-04-24 DIAGNOSIS — E119 Type 2 diabetes mellitus without complications: Secondary | ICD-10-CM | POA: Diagnosis not present

## 2016-04-24 DIAGNOSIS — N2581 Secondary hyperparathyroidism of renal origin: Secondary | ICD-10-CM | POA: Diagnosis not present

## 2016-04-24 DIAGNOSIS — N186 End stage renal disease: Secondary | ICD-10-CM | POA: Diagnosis not present

## 2016-04-26 DIAGNOSIS — N2581 Secondary hyperparathyroidism of renal origin: Secondary | ICD-10-CM | POA: Diagnosis not present

## 2016-04-26 DIAGNOSIS — N186 End stage renal disease: Secondary | ICD-10-CM | POA: Diagnosis not present

## 2016-04-26 DIAGNOSIS — E119 Type 2 diabetes mellitus without complications: Secondary | ICD-10-CM | POA: Diagnosis not present

## 2016-04-28 DIAGNOSIS — E119 Type 2 diabetes mellitus without complications: Secondary | ICD-10-CM | POA: Diagnosis not present

## 2016-04-28 DIAGNOSIS — N2581 Secondary hyperparathyroidism of renal origin: Secondary | ICD-10-CM | POA: Diagnosis not present

## 2016-04-28 DIAGNOSIS — N186 End stage renal disease: Secondary | ICD-10-CM | POA: Diagnosis not present

## 2016-05-01 DIAGNOSIS — N2581 Secondary hyperparathyroidism of renal origin: Secondary | ICD-10-CM | POA: Diagnosis not present

## 2016-05-01 DIAGNOSIS — E119 Type 2 diabetes mellitus without complications: Secondary | ICD-10-CM | POA: Diagnosis not present

## 2016-05-01 DIAGNOSIS — N186 End stage renal disease: Secondary | ICD-10-CM | POA: Diagnosis not present

## 2016-05-02 ENCOUNTER — Emergency Department (HOSPITAL_BASED_OUTPATIENT_CLINIC_OR_DEPARTMENT_OTHER)
Admission: EM | Admit: 2016-05-02 | Discharge: 2016-05-02 | Disposition: A | Discharge status: Home / Self Care | Payer: Medicare Other | Attending: Emergency Medicine | Admitting: Emergency Medicine

## 2016-05-02 ENCOUNTER — Emergency Department (HOSPITAL_BASED_OUTPATIENT_CLINIC_OR_DEPARTMENT_OTHER): Payer: Medicare Other

## 2016-05-02 ENCOUNTER — Encounter (HOSPITAL_BASED_OUTPATIENT_CLINIC_OR_DEPARTMENT_OTHER): Payer: Self-pay | Admitting: *Deleted

## 2016-05-02 DIAGNOSIS — R059 Cough, unspecified: Secondary | ICD-10-CM

## 2016-05-02 DIAGNOSIS — E1122 Type 2 diabetes mellitus with diabetic chronic kidney disease: Secondary | ICD-10-CM | POA: Insufficient documentation

## 2016-05-02 DIAGNOSIS — N186 End stage renal disease: Secondary | ICD-10-CM | POA: Diagnosis not present

## 2016-05-02 DIAGNOSIS — Z87891 Personal history of nicotine dependence: Secondary | ICD-10-CM | POA: Diagnosis not present

## 2016-05-02 DIAGNOSIS — Z79899 Other long term (current) drug therapy: Secondary | ICD-10-CM | POA: Insufficient documentation

## 2016-05-02 DIAGNOSIS — I509 Heart failure, unspecified: Secondary | ICD-10-CM | POA: Diagnosis not present

## 2016-05-02 DIAGNOSIS — R05 Cough: Secondary | ICD-10-CM | POA: Insufficient documentation

## 2016-05-02 DIAGNOSIS — I132 Hypertensive heart and chronic kidney disease with heart failure and with stage 5 chronic kidney disease, or end stage renal disease: Secondary | ICD-10-CM | POA: Insufficient documentation

## 2016-05-02 DIAGNOSIS — Z992 Dependence on renal dialysis: Secondary | ICD-10-CM | POA: Diagnosis not present

## 2016-05-02 DIAGNOSIS — J45909 Unspecified asthma, uncomplicated: Secondary | ICD-10-CM | POA: Diagnosis not present

## 2016-05-02 DIAGNOSIS — J449 Chronic obstructive pulmonary disease, unspecified: Secondary | ICD-10-CM | POA: Insufficient documentation

## 2016-05-02 MED ORDER — ALBUTEROL SULFATE HFA 108 (90 BASE) MCG/ACT IN AERS: 1.0000 | INHALATION_SPRAY | Freq: Four times a day (QID) | RESPIRATORY_TRACT | 0 refills | Status: DC | PRN

## 2016-05-02 MED ORDER — BENZONATATE 100 MG PO CAPS: 100.0000 mg | ORAL_CAPSULE | Freq: Three times a day (TID) | ORAL | 0 refills | Status: DC

## 2016-05-02 MED ORDER — CETIRIZINE HCL 10 MG PO TABS: 10.0000 mg | ORAL_TABLET | Freq: Every day | ORAL | 0 refills | Status: DC

## 2016-05-02 MED ORDER — LORATADINE 10 MG PO TABS
10.0000 mg | ORAL_TABLET | Freq: Once | ORAL | Status: AC
  Administered 2016-05-02: 10 mg via ORAL
  Filled 2016-05-02: qty 1

## 2016-05-02 MED ORDER — BENZONATATE 100 MG PO CAPS
200.0000 mg | ORAL_CAPSULE | Freq: Once | ORAL | Status: AC
  Administered 2016-05-02: 200 mg via ORAL
  Filled 2016-05-02: qty 2

## 2016-05-02 NOTE — ED Triage Notes (Signed)
c/o cough x 1 month, and eye drainage x 1 week

## 2016-05-03 DIAGNOSIS — N186 End stage renal disease: Secondary | ICD-10-CM | POA: Diagnosis not present

## 2016-05-03 DIAGNOSIS — N2581 Secondary hyperparathyroidism of renal origin: Secondary | ICD-10-CM | POA: Diagnosis not present

## 2016-05-03 DIAGNOSIS — E119 Type 2 diabetes mellitus without complications: Secondary | ICD-10-CM | POA: Diagnosis not present

## 2016-05-04 ENCOUNTER — Emergency Department (HOSPITAL_BASED_OUTPATIENT_CLINIC_OR_DEPARTMENT_OTHER)
Admission: EM | Admit: 2016-05-04 | Discharge: 2016-05-04 | Disposition: A | Discharge status: Home / Self Care | Payer: Medicare Other | Attending: Emergency Medicine | Admitting: Emergency Medicine

## 2016-05-04 ENCOUNTER — Encounter (HOSPITAL_BASED_OUTPATIENT_CLINIC_OR_DEPARTMENT_OTHER): Payer: Self-pay | Admitting: Emergency Medicine

## 2016-05-04 DIAGNOSIS — J449 Chronic obstructive pulmonary disease, unspecified: Secondary | ICD-10-CM | POA: Diagnosis not present

## 2016-05-04 DIAGNOSIS — E1122 Type 2 diabetes mellitus with diabetic chronic kidney disease: Secondary | ICD-10-CM | POA: Diagnosis not present

## 2016-05-04 DIAGNOSIS — Z87891 Personal history of nicotine dependence: Secondary | ICD-10-CM | POA: Insufficient documentation

## 2016-05-04 DIAGNOSIS — I509 Heart failure, unspecified: Secondary | ICD-10-CM | POA: Insufficient documentation

## 2016-05-04 DIAGNOSIS — Z992 Dependence on renal dialysis: Secondary | ICD-10-CM | POA: Diagnosis not present

## 2016-05-04 DIAGNOSIS — J45909 Unspecified asthma, uncomplicated: Secondary | ICD-10-CM | POA: Diagnosis not present

## 2016-05-04 DIAGNOSIS — Z79899 Other long term (current) drug therapy: Secondary | ICD-10-CM | POA: Insufficient documentation

## 2016-05-04 DIAGNOSIS — H1089 Other conjunctivitis: Secondary | ICD-10-CM

## 2016-05-04 DIAGNOSIS — H1133 Conjunctival hemorrhage, bilateral: Secondary | ICD-10-CM | POA: Insufficient documentation

## 2016-05-04 DIAGNOSIS — H5713 Ocular pain, bilateral: Secondary | ICD-10-CM | POA: Diagnosis present

## 2016-05-04 DIAGNOSIS — N186 End stage renal disease: Secondary | ICD-10-CM | POA: Diagnosis not present

## 2016-05-04 DIAGNOSIS — I132 Hypertensive heart and chronic kidney disease with heart failure and with stage 5 chronic kidney disease, or end stage renal disease: Secondary | ICD-10-CM | POA: Diagnosis not present

## 2016-05-04 LAB — CBC
HEMATOCRIT: 38.1 % — AB (ref 39.0–52.0)
HEMOGLOBIN: 12.3 g/dL — AB (ref 13.0–17.0)
MCH: 30.5 pg (ref 26.0–34.0)
MCHC: 32.3 g/dL (ref 30.0–36.0)
MCV: 94.5 fL (ref 78.0–100.0)
Platelets: 75 10*3/uL — ABNORMAL LOW (ref 150–400)
RBC: 4.03 MIL/uL — ABNORMAL LOW (ref 4.22–5.81)
RDW: 15.9 % — ABNORMAL HIGH (ref 11.5–15.5)
WBC: 5 10*3/uL (ref 4.0–10.5)

## 2016-05-04 MED ORDER — TOBRAMYCIN 0.3 % OP SOLN
2.0000 [drp] | Freq: Once | OPHTHALMIC | Status: AC
  Administered 2016-05-04: 2 [drp] via OPHTHALMIC
  Filled 2016-05-04: qty 5

## 2016-05-04 NOTE — Discharge Instructions (Signed)
It was our pleasure to provide your ER care today - we hope that you feel better.  Avoid rubbing your eyes.   Use tobrex eye drops - 1-2 drops in each eye 4x/day.  If matting/crustiness on eyelashes, you may gently wipe with warm moist washcloth.   From lab work, your platelet count remains low (75) - follow up with your doctor.  Follow up with eye specialist in the next few days if symptoms fail to improve/resolve - see referral - call office to arrange appointment.   Return to ER if worse, severe eye pain, change in vision, fevers, other concern.

## 2016-05-04 NOTE — ED Triage Notes (Signed)
Pt seen in ED 2 days ago for same complaint.

## 2016-05-04 NOTE — ED Provider Notes (Signed)
[facility] DEPT [facility] Provider Note   CSN: [id number] Arrival date & time: 05/04/16  1156     History   Chief Complaint Chief Complaint  Patient presents with  . Eye Pain    HPI KAYVEON LENNARTZ is a 56 y.o. male.  Patient c/o bilateral eye redness and drainage x 1 week. +matting on eyelashes. Denies severe eye pain or change in vision. No fb sensation. Denies direct injury to eye, denies chemical exposure. Was seen in ED for same a couple days ago, states given allergy drops but no better. No contact lens use. Denies headache. No fever or chills. Denies sore throat, cough or uri symptoms. Hx esrd on HD - had normal dialysis yesterday.    The history is provided by the patient.  Eye Pain  Pertinent negatives include no chest pain, no abdominal pain, no headaches and no shortness of breath.    Past Medical History:  Diagnosis Date  . Anemia   . Anxiety   . Asthma   . CHF (congestive heart failure) ([place])   . Complication of anesthesia    according to pt and spouse pt was moving around and cough while under and pt had difficulty waking up so the anesthesia had to be reversed. and pt admitted to ICU.   Marland Kitchen COPD (chronic obstructive pulmonary disease) ([place])   . Diabetes mellitus without complication ([place])    not on medications  . ESRD    on HD, M- W- F   . Hemiparesis due to old stroke ([place])    left  . Hypertension   . LV dysfunction    EF 25-30% by echo 07/2011  . Memory loss due to medical condition    due to stroke  . MR (mitral regurgitation)    moderate to severe, echo 07/2011  . Shortness of breath   . Stroke [facility])    TIA's-left sided weakness  . Tobacco abuse     Patient Active Problem List   Diagnosis Date Noted  . Thrombocytopenia ([place]) 01/17/2015  . Transfusion history 01/17/2015  . 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 Complication from renal dialysis device 11/02/2013  . Hyperkalemia 10/06/2013  . Cough 09/07/2013  . CHF, systolic  dysfunction 74/08/1446  . Other complications due to renal dialysis device, implant, and graft 10/27/2012  . Cerebral embolism with cerebral infarction ([place]) 08/13/2011  . Shortness of breath 03/13/2011  . ESRD (end stage renal disease) on dialysis ([place]) 03/13/2011  . HTN (hypertension) 03/13/2011  . H/O: CVA (cardiovascular accident) 03/13/2011  . Pleural effusion, right chronic 03/13/2011  . Anemia 03/13/2011  . Fluid overload 03/13/2011  . History of nonadherence to medical treatment 03/13/2011  . DM (diabetes mellitus) ([place]) 03/13/2011  . Tobacco abuse 03/13/2011  . Essential hypertension, benign 10/04/2008  . ESOPHAGEAL VARICES 10/04/2008  . HEMATOCHEZIA 10/04/2008  . End stage renal disease ([place]) 10/04/2008    Past Surgical History:  Procedure Laterality Date  . AV FISTULA PLACEMENT    . CARDIAC CATHETERIZATION     [facility]  . COLONOSCOPY W/ BIOPSIES AND POLYPECTOMY    . FISTULA SUPERFICIALIZATION Left 11/10/2013   Procedure: FISTULA PLICATION;  Surgeon: Serafina Mitchell, MD;  Location: [facility];  Service: Vascular;  Laterality: Left;  . KIDNEY TRANSPLANT     2011 rejected kidney 2012 back on dialysis  . LEFT AND RIGHT HEART CATHETERIZATION WITH CORONARY ANGIOGRAM N/A 10/09/2013   Procedure: LEFT AND RIGHT HEART CATHETERIZATION WITH CORONARY ANGIOGRAM;  Surgeon: Harrell Gave  Santina Evans, MD;  Location: [facility] CATH LAB;  Service: Cardiovascular;  Laterality: N/A;  . REVISON OF ARTERIOVENOUS FISTULA Left 08/26/2013   Procedure: EXCISION OF ERODED SKIN AND EXPLORATION OF MAIN LEFT UPPER ARM AV FISTULA;  Surgeon: Rosetta Posner, MD;  Location: [facility];  Service: Vascular;  Laterality: Left;  . REVISON OF ARTERIOVENOUS FISTULA Left 02/05/2014   Procedure: REPAIR OF ARTERIOVENOUS FISTULA ANEURYSM;  Surgeon: Serafina Mitchell, MD;  Location: [facility];  Service: Vascular;  Laterality: Left;  . SHUNTOGRAM N/A 11/05/2012   Procedure: Fistulogram;  Surgeon: Serafina Mitchell, MD;  Location: [facility] CATH  LAB;  Service: Cardiovascular;  Laterality: N/A;  . TEE WITHOUT CARDIOVERSION  08/17/2011   Procedure: TRANSESOPHAGEAL ECHOCARDIOGRAM (TEE);  Surgeon: Larey Dresser, MD;  Location: [facility];  Service: Cardiovascular;  Laterality: N/A;  . VIDEO ASSISTED THORACOSCOPY (VATS)/DECORTICATION  08/10/11       Home Medications    Prior to Admission medications   Medication Sig Start Date End Date Taking? Authorizing Provider  albuterol (PROVENTIL HFA;VENTOLIN HFA) 108 (90 Base) MCG/ACT inhaler Inhale 1-2 puffs into the lungs every 6 (six) hours as needed for wheezing or shortness of breath. 05/02/16   April Palumbo, MD  benzonatate (TESSALON) 100 MG capsule Take 1 capsule (100 mg total) by mouth every 8 (eight) hours. 05/02/16   April Palumbo, MD  cetirizine (ZYRTEC ALLERGY) 10 MG tablet Take 1 tablet (10 mg total) by mouth daily. 05/02/16   April Palumbo, MD  clonazePAM Bobbye [place]) 1 MG tablet 1 mg 01/27/10   Historical Provider, MD  cloNIDine (CATAPRES-TTS-3) 0.3 mg/24hr patch WEEKLY 01/27/10   Historical Provider, MD  Fluticasone-Salmeterol (ADVAIR) 250-50 MCG/DOSE AEPB Inhale 2 puffs into the lungs daily at 12 noon.    Historical Provider, MD  FOSRENOL 1000 MG chewable tablet CHEW & SWALLOW 2 TABLETS BY MOUTH WITH MEALS 12/25/14   Historical Provider, MD  glucose blood (ONE TOUCH ULTRA TEST) test strip  12/26/09   Historical Provider, MD  labetalol (NORMODYNE) 300 MG tablet Take 300 mg by mouth 2 (two) times daily.     Historical Provider, MD  SENSIPAR 30 MG tablet  01/11/15   Historical Provider, MD  tadalafil (CIALIS) 5 MG tablet Take 5 mg by mouth daily as needed for erectile dysfunction.    Historical Provider, MD  traMADol (ULTRAM) 50 MG tablet Take 1 tablet (50 mg total) by mouth every 6 (six) hours as needed. 02/05/14   Alvia Grove, PA-C    Family History Family History  Problem Relation Age of Onset  . Hypertension Mother   . Varicose Veins Mother   . Diabetes Paternal Grandmother     . Cancer Paternal Grandfather   . CAD Paternal Uncle     Social History Social History  Substance Use Topics  . Smoking status: Former Smoker    Packs/day: 0.50    Years: 32.00    Types: Cigarettes    Quit date: 10/13/2012  . Smokeless tobacco: Never Used  . Alcohol use No     Allergies   Codeine   Review of Systems Review of Systems  Constitutional: Negative for fever.  HENT: Negative for sore throat.   Eyes: Positive for discharge and redness. Negative for photophobia and visual disturbance.  Respiratory: Negative for cough and shortness of breath.   Cardiovascular: Negative for chest pain.  Gastrointestinal: Negative for abdominal pain.  Genitourinary: Negative for flank pain.  Musculoskeletal: Negative for neck pain.  Skin: Negative for rash.  Neurological: Negative for headaches.  Hematological: Does not bruise/bleed easily.  Psychiatric/Behavioral: Negative for confusion.     Physical Exam Updated Vital Signs BP (!) 149/84   Pulse (!) 108   Temp 98.8 F (37.1 C) (Oral)   Resp 18   Wt 77.1 kg   SpO2 93%   BMI 25.10 kg/m   Physical Exam  Constitutional: He appears well-developed and well-nourished. No distress.  HENT:  Head: Atraumatic.  Mouth/Throat: Oropharynx is clear and moist.  Eyes: EOM are normal. Pupils are equal, round, and reactive to light.  Bilateral conj injected. bil subconj hem. +matting on lashes. No orbital or periorbital cellulitis.   Neck: Neck supple. No tracheal deviation present.  Cardiovascular: Normal rate, regular rhythm, normal heart sounds and intact distal pulses.   Pulmonary/Chest: Effort normal and breath sounds normal. No accessory muscle usage. No respiratory distress.  Abdominal: He exhibits no distension. There is no tenderness.  Musculoskeletal: He exhibits no edema.  Lymphadenopathy:    He has no cervical adenopathy.  Neurological: He is alert.  Skin: Skin is warm and dry. No rash noted. He is not diaphoretic.   Psychiatric: He has a normal mood and affect.  Nursing note and vitals reviewed.    ED Treatments / Results  Labs (all labs ordered are listed, but only abnormal results are displayed)  Results for orders placed or performed during the hospital encounter of 05/04/16  CBC  Result Value Ref Range   WBC 5.0 4.0 - 10.5 K/uL   RBC 4.03 (L) 4.22 - 5.81 MIL/uL   Hemoglobin 12.3 (L) 13.0 - 17.0 g/dL   HCT 38.1 (L) 39.0 - 52.0 %   MCV 94.5 78.0 - 100.0 fL   MCH 30.5 26.0 - 34.0 pg   MCHC 32.3 30.0 - 36.0 g/dL   RDW 15.9 (H) 11.5 - 15.5 %   Platelets 75 (L) 150 - 400 K/uL     EKG  EKG Interpretation None       Radiology No results found.  Procedures Procedures (including critical care time)  Medications Ordered in ED Medications - No data to display   Initial Impression / Assessment and Plan / ED Course  I have reviewed the triage vital signs and the nursing notes.  Pertinent labs & imaging results that were available during my care of the patient were reviewed by me and considered in my medical decision making (see chart for details).  Reviewed nursing notes and prior charts for additional history.   Pt with hx thrombocytopenia. Is also ESRD/HD patient. Will check plt count.  Plt count c/w prior.   Exam c/w conjunctivitis. Also w subcon hem.   tobrex gtts.   Final Clinical Impressions(s) / ED Diagnoses   Final diagnoses:  None    New Prescriptions New Prescriptions   No medications on file     Lajean Saver, MD 05/04/16 1404

## 2016-05-05 DIAGNOSIS — N186 End stage renal disease: Secondary | ICD-10-CM | POA: Diagnosis not present

## 2016-05-05 DIAGNOSIS — E119 Type 2 diabetes mellitus without complications: Secondary | ICD-10-CM | POA: Diagnosis not present

## 2016-05-05 DIAGNOSIS — N2581 Secondary hyperparathyroidism of renal origin: Secondary | ICD-10-CM | POA: Diagnosis not present

## 2016-05-07 DIAGNOSIS — N186 End stage renal disease: Secondary | ICD-10-CM | POA: Diagnosis not present

## 2016-05-07 DIAGNOSIS — Z992 Dependence on renal dialysis: Secondary | ICD-10-CM | POA: Diagnosis not present

## 2016-05-07 DIAGNOSIS — T8612 Kidney transplant failure: Secondary | ICD-10-CM | POA: Diagnosis not present

## 2016-05-08 ENCOUNTER — Encounter (HOSPITAL_BASED_OUTPATIENT_CLINIC_OR_DEPARTMENT_OTHER): Payer: Self-pay | Admitting: Emergency Medicine

## 2016-05-08 DIAGNOSIS — D631 Anemia in chronic kidney disease: Secondary | ICD-10-CM | POA: Diagnosis not present

## 2016-05-08 DIAGNOSIS — N186 End stage renal disease: Secondary | ICD-10-CM | POA: Diagnosis not present

## 2016-05-08 DIAGNOSIS — E119 Type 2 diabetes mellitus without complications: Secondary | ICD-10-CM | POA: Diagnosis not present

## 2016-05-08 DIAGNOSIS — N2581 Secondary hyperparathyroidism of renal origin: Secondary | ICD-10-CM | POA: Diagnosis not present

## 2016-05-08 NOTE — ED Provider Notes (Signed)
[facility] DEPT Provider Note   CSN: [id number] Arrival date & time: 05/02/16  8088     History   Chief Complaint Chief Complaint  Patient presents with  . Cough    HPI Nathaniel White is a 56 y.o. male.  The history is provided by the patient.  Cough  This is a chronic problem. The current episode started more than 1 week ago. The problem occurs constantly. The problem has not changed since onset.The cough is non-productive. There has been no fever. Pertinent negatives include no chest pain, no chills, no sweats, no weight loss and no shortness of breath. He has tried nothing for the symptoms. The treatment provided no relief. His past medical history does not include pneumonia.    Past Medical History:  Diagnosis Date  . Anemia   . Anxiety   . Asthma   . CHF (congestive heart failure) ([place])   . Complication of anesthesia    according to pt and spouse pt was moving around and cough while under and pt had difficulty waking up so the anesthesia had to be reversed. and pt admitted to ICU.   Marland Kitchen COPD (chronic obstructive pulmonary disease) ([place])   . Diabetes mellitus without complication ([place])    not on medications  . ESRD    on HD, M- W- F   . Hemiparesis due to old stroke ([place])    left  . Hypertension   . LV dysfunction    EF 25-30% by echo 07/2011  . Memory loss due to medical condition    due to stroke  . MR (mitral regurgitation)    moderate to severe, echo 07/2011  . Shortness of breath   . Stroke [facility])    TIA's-left sided weakness  . Tobacco abuse     Patient Active Problem List   Diagnosis Date Noted  . Thrombocytopenia ([place]) 01/17/2015  . Transfusion history 01/17/2015  . 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 Complication from renal dialysis device 11/02/2013  . Hyperkalemia 10/06/2013  . Cough 09/07/2013  . CHF, systolic dysfunction 11/10/1592  . Other complications due to renal dialysis device, implant, and graft 10/27/2012  .  Cerebral embolism with cerebral infarction ([place]) 08/13/2011  . Shortness of breath 03/13/2011  . ESRD (end stage renal disease) on dialysis ([place]) 03/13/2011  . HTN (hypertension) 03/13/2011  . H/O: CVA (cardiovascular accident) 03/13/2011  . Pleural effusion, right chronic 03/13/2011  . Anemia 03/13/2011  . Fluid overload 03/13/2011  . History of nonadherence to medical treatment 03/13/2011  . DM (diabetes mellitus) ([place]) 03/13/2011  . Tobacco abuse 03/13/2011  . Essential hypertension, benign 10/04/2008  . ESOPHAGEAL VARICES 10/04/2008  . HEMATOCHEZIA 10/04/2008  . End stage renal disease ([place]) 10/04/2008    Past Surgical History:  Procedure Laterality Date  . AV FISTULA PLACEMENT    . CARDIAC CATHETERIZATION     [facility]  . COLONOSCOPY W/ BIOPSIES AND POLYPECTOMY    . FISTULA SUPERFICIALIZATION Left 11/10/2013   Procedure: FISTULA PLICATION;  Surgeon: Serafina Mitchell, MD;  Location: [facility];  Service: Vascular;  Laterality: Left;  . KIDNEY TRANSPLANT     2011 rejected kidney 2012 back on dialysis  . LEFT AND RIGHT HEART CATHETERIZATION WITH CORONARY ANGIOGRAM N/A 10/09/2013   Procedure: LEFT AND RIGHT HEART CATHETERIZATION WITH CORONARY ANGIOGRAM;  Surgeon: Burnell Blanks, MD;  Location[facility] C[facility] LAB;  Service: Cardiovascular;  Laterality: N/A;  . REVISON OF ARTERIOVENOUS FISTULA Left 08/26/2013   Procedure: EXCISION  OF ERODED SKIN AND EXPLORATION OF MAIN LEFT UPPER ARM AV FISTULA;  Surgeon: Rosetta Posner, MD;  Location: [facility];  Service: Vascular;  Laterality: Left;  . REVISON OF ARTERIOVENOUS FISTULA Left 02/05/2014   Procedure: REPAIR OF ARTERIOVENOUS FISTULA ANEURYSM;  Surgeon: Serafina Mitchell, MD;  Location: [facility];  Service: Vascular;  Laterality: Left;  . SHUNTOGRAM N/A 11/05/2012   Procedure: Fistulogram;  Surgeon: Serafina Mitchell, MD;  Location: [facility] CATH LAB;  Service: Cardiovascular;  Laterality: N/A;  . TEE WITHOUT CARDIOVERSION  08/17/2011   Procedure:  TRANSESOPHAGEAL ECHOCARDIOGRAM (TEE);  Surgeon: Larey Dresser, MD;  Location: [facility];  Service: Cardiovascular;  Laterality: N/A;  . VIDEO ASSISTED THORACOSCOPY (VATS)/DECORTICATION  08/10/11       Home Medications    Prior to Admission medications   Medication Sig Start Date End Date Taking? Authorizing Provider  albuterol (PROVENTIL HFA;VENTOLIN HFA) 108 (90 Base) MCG/ACT inhaler Inhale 1-2 puffs into the lungs every 6 (six) hours as needed for wheezing or shortness of breath. 05/02/16   Kyel Purk, MD  benzonatate (TESSALON) 100 MG capsule Take 1 capsule (100 mg total) by mouth every 8 (eight) hours. 05/02/16   Kinza Gouveia, MD  cetirizine (ZYRTEC ALLERGY) 10 MG tablet Take 1 tablet (10 mg total) by mouth daily. 05/02/16   Jeda Pardue, MD  clonazePAM Bobbye [place]) 1 MG tablet 1 mg 01/27/10   Historical Provider, MD  cloNIDine (CATAPRES-TTS-3) 0.3 mg/24hr patch WEEKLY 01/27/10   Historical Provider, MD  Fluticasone-Salmeterol (ADVAIR) 250-50 MCG/DOSE AEPB Inhale 2 puffs into the lungs daily at 12 noon.    Historical Provider, MD  FOSRENOL 1000 MG chewable tablet CHEW & SWALLOW 2 TABLETS BY MOUTH WITH MEALS 12/25/14   Historical Provider, MD  glucose blood (ONE TOUCH ULTRA TEST) test strip  12/26/09   Historical Provider, MD  labetalol (NORMODYNE) 300 MG tablet Take 300 mg by mouth 2 (two) times daily.     Historical Provider, MD  SENSIPAR 30 MG tablet  01/11/15   Historical Provider, MD  tadalafil (CIALIS) 5 MG tablet Take 5 mg by mouth daily as needed for erectile dysfunction.    Historical Provider, MD  traMADol (ULTRAM) 50 MG tablet Take 1 tablet (50 mg total) by mouth every 6 (six) hours as needed. 02/05/14   Alvia Grove, PA-C    Family History Family History  Problem Relation Age of Onset  . Hypertension Mother   . Varicose Veins Mother   . Diabetes Paternal Grandmother   . Cancer Paternal Grandfather   . CAD Paternal Uncle     Social History Social History    Substance Use Topics  . Smoking status: Former Smoker    Packs/day: 0.50    Years: 32.00    Types: Cigarettes    Quit date: 10/13/2012  . Smokeless tobacco: Never Used  . Alcohol use No     Allergies   Codeine   Review of Systems Review of Systems  Constitutional: Negative for chills and weight loss.  Respiratory: Positive for cough. Negative for shortness of breath.   Cardiovascular: Negative for chest pain, palpitations and leg swelling.  All other systems reviewed and are negative.    Physical Exam Updated Vital Signs BP (!) 155/87 (BP Location: Right Arm)   Pulse (!) 111   Temp 98.1 F (36.7 C) (Oral)   Resp 20   Ht _0  (1.753 m)   Wt 170 lb (77.1 kg)   SpO2 92%   BMI 25.10  kg/m   Physical Exam  Constitutional: He is oriented to person, place, and time. He appears well-developed and well-nourished. No distress.  HENT:  Head: Normocephalic and atraumatic.  Mouth/Throat: No oropharyngeal exudate.  Eyes: Conjunctivae and EOM are normal. Pupils are equal, round, and reactive to light.  Neck: Normal range of motion. Neck supple.  Cardiovascular: Normal rate, regular rhythm, normal heart sounds and intact distal pulses.   Pulmonary/Chest: Effort normal and breath sounds normal. No stridor. He has no wheezes. He has no rales.  Abdominal: Soft. Bowel sounds are normal. He exhibits no mass. There is no tenderness. There is no rebound and no guarding.  Musculoskeletal: Normal range of motion. He exhibits no edema.  Neurological: He is alert and oriented to person, place, and time.  Skin: Skin is warm and dry. Capillary refill takes less than 2 seconds.     ED Treatments / Results   Vitals:   05/02/16 0340  BP: (!) 155/87  Pulse: (!) 111  Resp: 20  Temp: 98.1 F (36.7 C)    Radiology  Results for orders placed or performed in visit on 01/17/15  CBC & Diff and Retic  Result Value Ref Range   WBC 3.4 (L) 4.0 - 10.3 10e3/uL   NEUT# 1.9 1.5 - 6.5 10e3/uL    HGB 11.5 (L) 13.0 - 17.1 g/dL   HCT 35.7 (L) 38.4 - 49.9 %   Platelets 78 (L) 140 - 400 10e3/uL   MCV 96.5 79.3 - 98.0 fL   MCH 31.1 27.2 - 33.4 pg   MCHC 32.2 32.0 - 36.0 g/dL   RBC 3.70 (L) 4.20 - 5.82 10e6/uL   RDW 15.5 (H) 11.0 - 14.6 %   lymph# 0.7 (L) 0.9 - 3.3 10e3/uL   MONO# 0.3 0.1 - 0.9 10e3/uL   Eosinophils Absolute 0.5 0.0 - 0.5 10e3/uL   Basophils Absolute 0.0 0.0 - 0.1 10e3/uL   NEUT% 56.6 39.0 - 75.0 %   LYMPH% 21.7 14.0 - 49.0 %   MONO% 7.3 0.0 - 14.0 %   EOS% 13.2 (H) 0.0 - 7.0 %   BASO% 1.2 0.0 - 2.0 %   Retic % 0.73 (L) 0.80 - 1.80 %   Retic Ct Abs 27.01 (L) 34.80 - 93.90 10e3/uL   Immature Retic Fract 3.80 3.00 - 10.60 %  Smear  Result Value Ref Range   Smear Result Smear Available   Lactate Dehydrogenase  Result Value Ref Range   LDH 266 (H) 125 - 245 U/L  Hepatitis C Antibody  Result Value Ref Range   Hep C Virus Ab <0.1 0.0 - 0.9 s/co ratio  Sedimentation rate  Result Value Ref Range   Sedimentation Rate-Westergren 29 0 - 30 mm/hr  C-reactive protein  Result Value Ref Range   CRP 12.0 (H) 0.0 - 4.9 mg/L  Vitamin B12  Result Value Ref Range   Vitamin B12 744 211 - 946 pg/mL  Haptoglobin  Result Value Ref Range   Haptoglobin 105 34 - 200 mg/dL  Comprehensive metabolic panel  Result Value Ref Range   Sodium 142 136 - 145 mEq/L   Potassium 4.2 3.5 - 5.1 mEq/L   Chloride 99 98 - 109 mEq/L   CO2 28 22 - 29 mEq/L   Glucose 96 70 - 140 mg/dl   BUN 46.9 (H) 7.0 - 26.0 mg/dL   Creatinine 9.9 (HH) 0.7 - 1.3 mg/dL   Total Bilirubin 0.84 0.20 - 1.20 mg/dL   Alkaline Phosphatase 120 40 -  150 U/L   AST 17 5 - 34 U/L   ALT 9 0 - 55 U/L   Total Protein 7.8 6.4 - 8.3 g/dL   Albumin 3.5 3.5 - 5.0 g/dL   Calcium 10.2 8.4 - 10.4 mg/dL   Anion Gap 15 (H) 3 - 11 mEq/L   EGFR 6 (L) >90 ml/min/1.73 m2   Dg Chest 2 View  Result Date: 05/02/2016 CLINICAL DATA:  56 year old male with cough. EXAM: CHEST  2 VIEW COMPARISON:  Chest radiograph dated 11/15/2013  and CT dated 10/08/2013 FINDINGS: There is mild cardiomegaly. No vascular congestion or edema. Right pleural thickening and scarring appear similar to prior studies. Diffuse reticular densities throughout the lungs, likely chronic changes. No new consolidative changes. No pleural effusion or pneumothorax. There is atherosclerotic calcification of the aorta. No acute osseous pathology. IMPRESSION: No acute cardiopulmonary process. Right pleural thickening and scarring as seen on the prior radiograph. Mild cardiomegaly. Electronically Signed   By: Anner Crete M.D.   On: 05/02/2016 04:08     Procedures Procedures (including critical care time)  Medications Ordered in ED Medications  loratadine (CLARITIN) tablet 10 mg (10 mg Oral Given 05/02/16 0406)  benzonatate (TESSALON) capsule 200 mg (200 mg Oral Given 05/02/16 0406)      Final Clinical Impressions(s) / ED Diagnoses   Final diagnoses:  Cough   FOLLOW UP WITH YOUR pmd.  sTRICT RETURN PRECAUTIONS GIVEN  New Prescriptions Discharge Medication List as of 05/02/2016  4:42 AM    START taking these medications   Details  benzonatate (TESSALON) 100 MG capsule Take 1 capsule (100 mg total) by mouth every 8 (eight) hours., Starting Wed 05/02/2016, Print    cetirizine (ZYRTEC ALLERGY) 10 MG tablet Take 1 tablet (10 mg total) by mouth daily., Starting Wed 05/02/2016, Print         Kylinn Shropshire, MD 05/08/16 2352

## 2016-05-10 DIAGNOSIS — D631 Anemia in chronic kidney disease: Secondary | ICD-10-CM | POA: Diagnosis not present

## 2016-05-10 DIAGNOSIS — N186 End stage renal disease: Secondary | ICD-10-CM | POA: Diagnosis not present

## 2016-05-10 DIAGNOSIS — E119 Type 2 diabetes mellitus without complications: Secondary | ICD-10-CM | POA: Diagnosis not present

## 2016-05-10 DIAGNOSIS — N2581 Secondary hyperparathyroidism of renal origin: Secondary | ICD-10-CM | POA: Diagnosis not present

## 2016-05-12 DIAGNOSIS — E119 Type 2 diabetes mellitus without complications: Secondary | ICD-10-CM | POA: Diagnosis not present

## 2016-05-12 DIAGNOSIS — D631 Anemia in chronic kidney disease: Secondary | ICD-10-CM | POA: Diagnosis not present

## 2016-05-12 DIAGNOSIS — N2581 Secondary hyperparathyroidism of renal origin: Secondary | ICD-10-CM | POA: Diagnosis not present

## 2016-05-12 DIAGNOSIS — N186 End stage renal disease: Secondary | ICD-10-CM | POA: Diagnosis not present

## 2016-05-15 DIAGNOSIS — N186 End stage renal disease: Secondary | ICD-10-CM | POA: Diagnosis not present

## 2016-05-15 DIAGNOSIS — N2581 Secondary hyperparathyroidism of renal origin: Secondary | ICD-10-CM | POA: Diagnosis not present

## 2016-05-15 DIAGNOSIS — D631 Anemia in chronic kidney disease: Secondary | ICD-10-CM | POA: Diagnosis not present

## 2016-05-15 DIAGNOSIS — E119 Type 2 diabetes mellitus without complications: Secondary | ICD-10-CM | POA: Diagnosis not present

## 2016-05-17 DIAGNOSIS — N2581 Secondary hyperparathyroidism of renal origin: Secondary | ICD-10-CM | POA: Diagnosis not present

## 2016-05-17 DIAGNOSIS — D631 Anemia in chronic kidney disease: Secondary | ICD-10-CM | POA: Diagnosis not present

## 2016-05-17 DIAGNOSIS — E119 Type 2 diabetes mellitus without complications: Secondary | ICD-10-CM | POA: Diagnosis not present

## 2016-05-17 DIAGNOSIS — N186 End stage renal disease: Secondary | ICD-10-CM | POA: Diagnosis not present

## 2016-05-19 DIAGNOSIS — E119 Type 2 diabetes mellitus without complications: Secondary | ICD-10-CM | POA: Diagnosis not present

## 2016-05-19 DIAGNOSIS — N2581 Secondary hyperparathyroidism of renal origin: Secondary | ICD-10-CM | POA: Diagnosis not present

## 2016-05-19 DIAGNOSIS — N186 End stage renal disease: Secondary | ICD-10-CM | POA: Diagnosis not present

## 2016-05-19 DIAGNOSIS — D631 Anemia in chronic kidney disease: Secondary | ICD-10-CM | POA: Diagnosis not present

## 2016-05-22 DIAGNOSIS — N2581 Secondary hyperparathyroidism of renal origin: Secondary | ICD-10-CM | POA: Diagnosis not present

## 2016-05-22 DIAGNOSIS — E119 Type 2 diabetes mellitus without complications: Secondary | ICD-10-CM | POA: Diagnosis not present

## 2016-05-22 DIAGNOSIS — D631 Anemia in chronic kidney disease: Secondary | ICD-10-CM | POA: Diagnosis not present

## 2016-05-22 DIAGNOSIS — N186 End stage renal disease: Secondary | ICD-10-CM | POA: Diagnosis not present

## 2016-05-24 DIAGNOSIS — D631 Anemia in chronic kidney disease: Secondary | ICD-10-CM | POA: Diagnosis not present

## 2016-05-24 DIAGNOSIS — N2581 Secondary hyperparathyroidism of renal origin: Secondary | ICD-10-CM | POA: Diagnosis not present

## 2016-05-24 DIAGNOSIS — N186 End stage renal disease: Secondary | ICD-10-CM | POA: Diagnosis not present

## 2016-05-24 DIAGNOSIS — E119 Type 2 diabetes mellitus without complications: Secondary | ICD-10-CM | POA: Diagnosis not present

## 2016-05-26 DIAGNOSIS — D631 Anemia in chronic kidney disease: Secondary | ICD-10-CM | POA: Diagnosis not present

## 2016-05-26 DIAGNOSIS — N186 End stage renal disease: Secondary | ICD-10-CM | POA: Diagnosis not present

## 2016-05-26 DIAGNOSIS — E119 Type 2 diabetes mellitus without complications: Secondary | ICD-10-CM | POA: Diagnosis not present

## 2016-05-26 DIAGNOSIS — N2581 Secondary hyperparathyroidism of renal origin: Secondary | ICD-10-CM | POA: Diagnosis not present

## 2016-05-29 DIAGNOSIS — N186 End stage renal disease: Secondary | ICD-10-CM | POA: Diagnosis not present

## 2016-05-29 DIAGNOSIS — E119 Type 2 diabetes mellitus without complications: Secondary | ICD-10-CM | POA: Diagnosis not present

## 2016-05-29 DIAGNOSIS — D631 Anemia in chronic kidney disease: Secondary | ICD-10-CM | POA: Diagnosis not present

## 2016-05-29 DIAGNOSIS — N2581 Secondary hyperparathyroidism of renal origin: Secondary | ICD-10-CM | POA: Diagnosis not present

## 2016-05-31 DIAGNOSIS — N2581 Secondary hyperparathyroidism of renal origin: Secondary | ICD-10-CM | POA: Diagnosis not present

## 2016-05-31 DIAGNOSIS — D631 Anemia in chronic kidney disease: Secondary | ICD-10-CM | POA: Diagnosis not present

## 2016-05-31 DIAGNOSIS — E119 Type 2 diabetes mellitus without complications: Secondary | ICD-10-CM | POA: Diagnosis not present

## 2016-05-31 DIAGNOSIS — N186 End stage renal disease: Secondary | ICD-10-CM | POA: Diagnosis not present

## 2016-06-01 DIAGNOSIS — N186 End stage renal disease: Secondary | ICD-10-CM | POA: Diagnosis not present

## 2016-06-01 DIAGNOSIS — N2581 Secondary hyperparathyroidism of renal origin: Secondary | ICD-10-CM | POA: Diagnosis not present

## 2016-06-01 DIAGNOSIS — D631 Anemia in chronic kidney disease: Secondary | ICD-10-CM | POA: Diagnosis not present

## 2016-06-01 DIAGNOSIS — E119 Type 2 diabetes mellitus without complications: Secondary | ICD-10-CM | POA: Diagnosis not present

## 2016-06-05 DIAGNOSIS — N2581 Secondary hyperparathyroidism of renal origin: Secondary | ICD-10-CM | POA: Diagnosis not present

## 2016-06-05 DIAGNOSIS — N186 End stage renal disease: Secondary | ICD-10-CM | POA: Diagnosis not present

## 2016-06-05 DIAGNOSIS — D631 Anemia in chronic kidney disease: Secondary | ICD-10-CM | POA: Diagnosis not present

## 2016-06-05 DIAGNOSIS — E119 Type 2 diabetes mellitus without complications: Secondary | ICD-10-CM | POA: Diagnosis not present

## 2016-06-07 DIAGNOSIS — D631 Anemia in chronic kidney disease: Secondary | ICD-10-CM | POA: Diagnosis not present

## 2016-06-07 DIAGNOSIS — E119 Type 2 diabetes mellitus without complications: Secondary | ICD-10-CM | POA: Diagnosis not present

## 2016-06-07 DIAGNOSIS — N186 End stage renal disease: Secondary | ICD-10-CM | POA: Diagnosis not present

## 2016-06-07 DIAGNOSIS — N2581 Secondary hyperparathyroidism of renal origin: Secondary | ICD-10-CM | POA: Diagnosis not present

## 2016-06-07 DIAGNOSIS — T8612 Kidney transplant failure: Secondary | ICD-10-CM | POA: Diagnosis not present

## 2016-06-07 DIAGNOSIS — Z992 Dependence on renal dialysis: Secondary | ICD-10-CM | POA: Diagnosis not present

## 2016-06-08 ENCOUNTER — Ambulatory Visit: Payer: Medicare Other | Admitting: Family Medicine

## 2016-06-09 DIAGNOSIS — D509 Iron deficiency anemia, unspecified: Secondary | ICD-10-CM | POA: Diagnosis not present

## 2016-06-09 DIAGNOSIS — N186 End stage renal disease: Secondary | ICD-10-CM | POA: Diagnosis not present

## 2016-06-09 DIAGNOSIS — E8770 Fluid overload, unspecified: Secondary | ICD-10-CM | POA: Diagnosis not present

## 2016-06-09 DIAGNOSIS — N2581 Secondary hyperparathyroidism of renal origin: Secondary | ICD-10-CM | POA: Diagnosis not present

## 2016-06-09 DIAGNOSIS — E119 Type 2 diabetes mellitus without complications: Secondary | ICD-10-CM | POA: Diagnosis not present

## 2016-06-12 DIAGNOSIS — D509 Iron deficiency anemia, unspecified: Secondary | ICD-10-CM | POA: Diagnosis not present

## 2016-06-12 DIAGNOSIS — E8770 Fluid overload, unspecified: Secondary | ICD-10-CM | POA: Diagnosis not present

## 2016-06-12 DIAGNOSIS — E119 Type 2 diabetes mellitus without complications: Secondary | ICD-10-CM | POA: Diagnosis not present

## 2016-06-12 DIAGNOSIS — N2581 Secondary hyperparathyroidism of renal origin: Secondary | ICD-10-CM | POA: Diagnosis not present

## 2016-06-12 DIAGNOSIS — N186 End stage renal disease: Secondary | ICD-10-CM | POA: Diagnosis not present

## 2016-06-14 DIAGNOSIS — N2581 Secondary hyperparathyroidism of renal origin: Secondary | ICD-10-CM | POA: Diagnosis not present

## 2016-06-14 DIAGNOSIS — N186 End stage renal disease: Secondary | ICD-10-CM | POA: Diagnosis not present

## 2016-06-14 DIAGNOSIS — E119 Type 2 diabetes mellitus without complications: Secondary | ICD-10-CM | POA: Diagnosis not present

## 2016-06-14 DIAGNOSIS — D509 Iron deficiency anemia, unspecified: Secondary | ICD-10-CM | POA: Diagnosis not present

## 2016-06-14 DIAGNOSIS — E8770 Fluid overload, unspecified: Secondary | ICD-10-CM | POA: Diagnosis not present

## 2016-06-16 DIAGNOSIS — N186 End stage renal disease: Secondary | ICD-10-CM | POA: Diagnosis not present

## 2016-06-16 DIAGNOSIS — E8770 Fluid overload, unspecified: Secondary | ICD-10-CM | POA: Diagnosis not present

## 2016-06-16 DIAGNOSIS — N2581 Secondary hyperparathyroidism of renal origin: Secondary | ICD-10-CM | POA: Diagnosis not present

## 2016-06-16 DIAGNOSIS — E119 Type 2 diabetes mellitus without complications: Secondary | ICD-10-CM | POA: Diagnosis not present

## 2016-06-16 DIAGNOSIS — D509 Iron deficiency anemia, unspecified: Secondary | ICD-10-CM | POA: Diagnosis not present

## 2016-06-19 DIAGNOSIS — D509 Iron deficiency anemia, unspecified: Secondary | ICD-10-CM | POA: Diagnosis not present

## 2016-06-19 DIAGNOSIS — E119 Type 2 diabetes mellitus without complications: Secondary | ICD-10-CM | POA: Diagnosis not present

## 2016-06-19 DIAGNOSIS — E8770 Fluid overload, unspecified: Secondary | ICD-10-CM | POA: Diagnosis not present

## 2016-06-19 DIAGNOSIS — N186 End stage renal disease: Secondary | ICD-10-CM | POA: Diagnosis not present

## 2016-06-19 DIAGNOSIS — N2581 Secondary hyperparathyroidism of renal origin: Secondary | ICD-10-CM | POA: Diagnosis not present

## 2016-06-21 DIAGNOSIS — N2581 Secondary hyperparathyroidism of renal origin: Secondary | ICD-10-CM | POA: Diagnosis not present

## 2016-06-21 DIAGNOSIS — D509 Iron deficiency anemia, unspecified: Secondary | ICD-10-CM | POA: Diagnosis not present

## 2016-06-21 DIAGNOSIS — E8770 Fluid overload, unspecified: Secondary | ICD-10-CM | POA: Diagnosis not present

## 2016-06-21 DIAGNOSIS — N186 End stage renal disease: Secondary | ICD-10-CM | POA: Diagnosis not present

## 2016-06-21 DIAGNOSIS — E119 Type 2 diabetes mellitus without complications: Secondary | ICD-10-CM | POA: Diagnosis not present

## 2016-06-22 DIAGNOSIS — N186 End stage renal disease: Secondary | ICD-10-CM | POA: Diagnosis not present

## 2016-06-22 DIAGNOSIS — E8770 Fluid overload, unspecified: Secondary | ICD-10-CM | POA: Diagnosis not present

## 2016-06-22 DIAGNOSIS — N2581 Secondary hyperparathyroidism of renal origin: Secondary | ICD-10-CM | POA: Diagnosis not present

## 2016-06-23 DIAGNOSIS — N2581 Secondary hyperparathyroidism of renal origin: Secondary | ICD-10-CM | POA: Diagnosis not present

## 2016-06-23 DIAGNOSIS — E119 Type 2 diabetes mellitus without complications: Secondary | ICD-10-CM | POA: Diagnosis not present

## 2016-06-23 DIAGNOSIS — N186 End stage renal disease: Secondary | ICD-10-CM | POA: Diagnosis not present

## 2016-06-23 DIAGNOSIS — D509 Iron deficiency anemia, unspecified: Secondary | ICD-10-CM | POA: Diagnosis not present

## 2016-06-23 DIAGNOSIS — E8770 Fluid overload, unspecified: Secondary | ICD-10-CM | POA: Diagnosis not present

## 2016-06-26 DIAGNOSIS — E119 Type 2 diabetes mellitus without complications: Secondary | ICD-10-CM | POA: Diagnosis not present

## 2016-06-26 DIAGNOSIS — N2581 Secondary hyperparathyroidism of renal origin: Secondary | ICD-10-CM | POA: Diagnosis not present

## 2016-06-26 DIAGNOSIS — D509 Iron deficiency anemia, unspecified: Secondary | ICD-10-CM | POA: Diagnosis not present

## 2016-06-26 DIAGNOSIS — E8770 Fluid overload, unspecified: Secondary | ICD-10-CM | POA: Diagnosis not present

## 2016-06-26 DIAGNOSIS — N186 End stage renal disease: Secondary | ICD-10-CM | POA: Diagnosis not present

## 2016-06-28 DIAGNOSIS — N186 End stage renal disease: Secondary | ICD-10-CM | POA: Diagnosis not present

## 2016-06-28 DIAGNOSIS — E8770 Fluid overload, unspecified: Secondary | ICD-10-CM | POA: Diagnosis not present

## 2016-06-28 DIAGNOSIS — E119 Type 2 diabetes mellitus without complications: Secondary | ICD-10-CM | POA: Diagnosis not present

## 2016-06-28 DIAGNOSIS — D509 Iron deficiency anemia, unspecified: Secondary | ICD-10-CM | POA: Diagnosis not present

## 2016-06-28 DIAGNOSIS — N2581 Secondary hyperparathyroidism of renal origin: Secondary | ICD-10-CM | POA: Diagnosis not present

## 2016-06-29 DIAGNOSIS — E119 Type 2 diabetes mellitus without complications: Secondary | ICD-10-CM | POA: Diagnosis not present

## 2016-06-29 DIAGNOSIS — D509 Iron deficiency anemia, unspecified: Secondary | ICD-10-CM | POA: Diagnosis not present

## 2016-06-29 DIAGNOSIS — N2581 Secondary hyperparathyroidism of renal origin: Secondary | ICD-10-CM | POA: Diagnosis not present

## 2016-06-29 DIAGNOSIS — N186 End stage renal disease: Secondary | ICD-10-CM | POA: Diagnosis not present

## 2016-06-29 DIAGNOSIS — E8770 Fluid overload, unspecified: Secondary | ICD-10-CM | POA: Diagnosis not present

## 2016-06-30 DIAGNOSIS — D509 Iron deficiency anemia, unspecified: Secondary | ICD-10-CM | POA: Diagnosis not present

## 2016-06-30 DIAGNOSIS — N186 End stage renal disease: Secondary | ICD-10-CM | POA: Diagnosis not present

## 2016-06-30 DIAGNOSIS — E119 Type 2 diabetes mellitus without complications: Secondary | ICD-10-CM | POA: Diagnosis not present

## 2016-06-30 DIAGNOSIS — E8770 Fluid overload, unspecified: Secondary | ICD-10-CM | POA: Diagnosis not present

## 2016-06-30 DIAGNOSIS — N2581 Secondary hyperparathyroidism of renal origin: Secondary | ICD-10-CM | POA: Diagnosis not present

## 2016-07-03 DIAGNOSIS — D509 Iron deficiency anemia, unspecified: Secondary | ICD-10-CM | POA: Diagnosis not present

## 2016-07-03 DIAGNOSIS — N186 End stage renal disease: Secondary | ICD-10-CM | POA: Diagnosis not present

## 2016-07-03 DIAGNOSIS — N2581 Secondary hyperparathyroidism of renal origin: Secondary | ICD-10-CM | POA: Diagnosis not present

## 2016-07-03 DIAGNOSIS — E8770 Fluid overload, unspecified: Secondary | ICD-10-CM | POA: Diagnosis not present

## 2016-07-03 DIAGNOSIS — E119 Type 2 diabetes mellitus without complications: Secondary | ICD-10-CM | POA: Diagnosis not present

## 2016-07-04 DIAGNOSIS — E8770 Fluid overload, unspecified: Secondary | ICD-10-CM | POA: Diagnosis not present

## 2016-07-04 DIAGNOSIS — N186 End stage renal disease: Secondary | ICD-10-CM | POA: Diagnosis not present

## 2016-07-04 DIAGNOSIS — N2581 Secondary hyperparathyroidism of renal origin: Secondary | ICD-10-CM | POA: Diagnosis not present

## 2016-07-05 DIAGNOSIS — N186 End stage renal disease: Secondary | ICD-10-CM | POA: Diagnosis not present

## 2016-07-05 DIAGNOSIS — E119 Type 2 diabetes mellitus without complications: Secondary | ICD-10-CM | POA: Diagnosis not present

## 2016-07-05 DIAGNOSIS — E8770 Fluid overload, unspecified: Secondary | ICD-10-CM | POA: Diagnosis not present

## 2016-07-05 DIAGNOSIS — N2581 Secondary hyperparathyroidism of renal origin: Secondary | ICD-10-CM | POA: Diagnosis not present

## 2016-07-05 DIAGNOSIS — D509 Iron deficiency anemia, unspecified: Secondary | ICD-10-CM | POA: Diagnosis not present

## 2016-07-07 DIAGNOSIS — N186 End stage renal disease: Secondary | ICD-10-CM | POA: Diagnosis not present

## 2016-07-07 DIAGNOSIS — D509 Iron deficiency anemia, unspecified: Secondary | ICD-10-CM | POA: Diagnosis not present

## 2016-07-07 DIAGNOSIS — T8612 Kidney transplant failure: Secondary | ICD-10-CM | POA: Diagnosis not present

## 2016-07-07 DIAGNOSIS — Z992 Dependence on renal dialysis: Secondary | ICD-10-CM | POA: Diagnosis not present

## 2016-07-07 DIAGNOSIS — E119 Type 2 diabetes mellitus without complications: Secondary | ICD-10-CM | POA: Diagnosis not present

## 2016-07-07 DIAGNOSIS — N2581 Secondary hyperparathyroidism of renal origin: Secondary | ICD-10-CM | POA: Diagnosis not present

## 2016-07-07 DIAGNOSIS — E8770 Fluid overload, unspecified: Secondary | ICD-10-CM | POA: Diagnosis not present

## 2016-07-10 DIAGNOSIS — D509 Iron deficiency anemia, unspecified: Secondary | ICD-10-CM | POA: Diagnosis not present

## 2016-07-10 DIAGNOSIS — E119 Type 2 diabetes mellitus without complications: Secondary | ICD-10-CM | POA: Diagnosis not present

## 2016-07-10 DIAGNOSIS — N186 End stage renal disease: Secondary | ICD-10-CM | POA: Diagnosis not present

## 2016-07-10 DIAGNOSIS — N2581 Secondary hyperparathyroidism of renal origin: Secondary | ICD-10-CM | POA: Diagnosis not present

## 2016-07-10 DIAGNOSIS — D631 Anemia in chronic kidney disease: Secondary | ICD-10-CM | POA: Diagnosis not present

## 2016-07-12 DIAGNOSIS — N186 End stage renal disease: Secondary | ICD-10-CM | POA: Diagnosis not present

## 2016-07-12 DIAGNOSIS — N2581 Secondary hyperparathyroidism of renal origin: Secondary | ICD-10-CM | POA: Diagnosis not present

## 2016-07-12 DIAGNOSIS — D631 Anemia in chronic kidney disease: Secondary | ICD-10-CM | POA: Diagnosis not present

## 2016-07-12 DIAGNOSIS — E119 Type 2 diabetes mellitus without complications: Secondary | ICD-10-CM | POA: Diagnosis not present

## 2016-07-12 DIAGNOSIS — D509 Iron deficiency anemia, unspecified: Secondary | ICD-10-CM | POA: Diagnosis not present

## 2016-07-14 DIAGNOSIS — D509 Iron deficiency anemia, unspecified: Secondary | ICD-10-CM | POA: Diagnosis not present

## 2016-07-14 DIAGNOSIS — N2581 Secondary hyperparathyroidism of renal origin: Secondary | ICD-10-CM | POA: Diagnosis not present

## 2016-07-14 DIAGNOSIS — E119 Type 2 diabetes mellitus without complications: Secondary | ICD-10-CM | POA: Diagnosis not present

## 2016-07-14 DIAGNOSIS — D631 Anemia in chronic kidney disease: Secondary | ICD-10-CM | POA: Diagnosis not present

## 2016-07-14 DIAGNOSIS — N186 End stage renal disease: Secondary | ICD-10-CM | POA: Diagnosis not present

## 2016-07-17 DIAGNOSIS — E119 Type 2 diabetes mellitus without complications: Secondary | ICD-10-CM | POA: Diagnosis not present

## 2016-07-17 DIAGNOSIS — D509 Iron deficiency anemia, unspecified: Secondary | ICD-10-CM | POA: Diagnosis not present

## 2016-07-17 DIAGNOSIS — D631 Anemia in chronic kidney disease: Secondary | ICD-10-CM | POA: Diagnosis not present

## 2016-07-17 DIAGNOSIS — N2581 Secondary hyperparathyroidism of renal origin: Secondary | ICD-10-CM | POA: Diagnosis not present

## 2016-07-17 DIAGNOSIS — N186 End stage renal disease: Secondary | ICD-10-CM | POA: Diagnosis not present

## 2016-07-19 DIAGNOSIS — E119 Type 2 diabetes mellitus without complications: Secondary | ICD-10-CM | POA: Diagnosis not present

## 2016-07-19 DIAGNOSIS — N186 End stage renal disease: Secondary | ICD-10-CM | POA: Diagnosis not present

## 2016-07-19 DIAGNOSIS — D509 Iron deficiency anemia, unspecified: Secondary | ICD-10-CM | POA: Diagnosis not present

## 2016-07-19 DIAGNOSIS — N2581 Secondary hyperparathyroidism of renal origin: Secondary | ICD-10-CM | POA: Diagnosis not present

## 2016-07-19 DIAGNOSIS — D631 Anemia in chronic kidney disease: Secondary | ICD-10-CM | POA: Diagnosis not present

## 2016-07-21 DIAGNOSIS — D631 Anemia in chronic kidney disease: Secondary | ICD-10-CM | POA: Diagnosis not present

## 2016-07-21 DIAGNOSIS — N186 End stage renal disease: Secondary | ICD-10-CM | POA: Diagnosis not present

## 2016-07-21 DIAGNOSIS — N2581 Secondary hyperparathyroidism of renal origin: Secondary | ICD-10-CM | POA: Diagnosis not present

## 2016-07-21 DIAGNOSIS — D509 Iron deficiency anemia, unspecified: Secondary | ICD-10-CM | POA: Diagnosis not present

## 2016-07-21 DIAGNOSIS — E119 Type 2 diabetes mellitus without complications: Secondary | ICD-10-CM | POA: Diagnosis not present

## 2016-07-24 DIAGNOSIS — D631 Anemia in chronic kidney disease: Secondary | ICD-10-CM | POA: Diagnosis not present

## 2016-07-24 DIAGNOSIS — N186 End stage renal disease: Secondary | ICD-10-CM | POA: Diagnosis not present

## 2016-07-24 DIAGNOSIS — E119 Type 2 diabetes mellitus without complications: Secondary | ICD-10-CM | POA: Diagnosis not present

## 2016-07-24 DIAGNOSIS — D509 Iron deficiency anemia, unspecified: Secondary | ICD-10-CM | POA: Diagnosis not present

## 2016-07-24 DIAGNOSIS — N2581 Secondary hyperparathyroidism of renal origin: Secondary | ICD-10-CM | POA: Diagnosis not present

## 2016-07-26 DIAGNOSIS — N2581 Secondary hyperparathyroidism of renal origin: Secondary | ICD-10-CM | POA: Diagnosis not present

## 2016-07-26 DIAGNOSIS — N186 End stage renal disease: Secondary | ICD-10-CM | POA: Diagnosis not present

## 2016-07-26 DIAGNOSIS — E119 Type 2 diabetes mellitus without complications: Secondary | ICD-10-CM | POA: Diagnosis not present

## 2016-07-26 DIAGNOSIS — D631 Anemia in chronic kidney disease: Secondary | ICD-10-CM | POA: Diagnosis not present

## 2016-07-26 DIAGNOSIS — D509 Iron deficiency anemia, unspecified: Secondary | ICD-10-CM | POA: Diagnosis not present

## 2016-07-28 DIAGNOSIS — D631 Anemia in chronic kidney disease: Secondary | ICD-10-CM | POA: Diagnosis not present

## 2016-07-28 DIAGNOSIS — N186 End stage renal disease: Secondary | ICD-10-CM | POA: Diagnosis not present

## 2016-07-28 DIAGNOSIS — E119 Type 2 diabetes mellitus without complications: Secondary | ICD-10-CM | POA: Diagnosis not present

## 2016-07-28 DIAGNOSIS — D509 Iron deficiency anemia, unspecified: Secondary | ICD-10-CM | POA: Diagnosis not present

## 2016-07-28 DIAGNOSIS — N2581 Secondary hyperparathyroidism of renal origin: Secondary | ICD-10-CM | POA: Diagnosis not present

## 2016-07-31 DIAGNOSIS — D509 Iron deficiency anemia, unspecified: Secondary | ICD-10-CM | POA: Diagnosis not present

## 2016-07-31 DIAGNOSIS — N2581 Secondary hyperparathyroidism of renal origin: Secondary | ICD-10-CM | POA: Diagnosis not present

## 2016-07-31 DIAGNOSIS — E119 Type 2 diabetes mellitus without complications: Secondary | ICD-10-CM | POA: Diagnosis not present

## 2016-07-31 DIAGNOSIS — D631 Anemia in chronic kidney disease: Secondary | ICD-10-CM | POA: Diagnosis not present

## 2016-07-31 DIAGNOSIS — N186 End stage renal disease: Secondary | ICD-10-CM | POA: Diagnosis not present

## 2016-08-02 DIAGNOSIS — E119 Type 2 diabetes mellitus without complications: Secondary | ICD-10-CM | POA: Diagnosis not present

## 2016-08-02 DIAGNOSIS — N186 End stage renal disease: Secondary | ICD-10-CM | POA: Diagnosis not present

## 2016-08-02 DIAGNOSIS — D509 Iron deficiency anemia, unspecified: Secondary | ICD-10-CM | POA: Diagnosis not present

## 2016-08-02 DIAGNOSIS — N2581 Secondary hyperparathyroidism of renal origin: Secondary | ICD-10-CM | POA: Diagnosis not present

## 2016-08-02 DIAGNOSIS — D631 Anemia in chronic kidney disease: Secondary | ICD-10-CM | POA: Diagnosis not present

## 2016-08-04 DIAGNOSIS — E119 Type 2 diabetes mellitus without complications: Secondary | ICD-10-CM | POA: Diagnosis not present

## 2016-08-04 DIAGNOSIS — N2581 Secondary hyperparathyroidism of renal origin: Secondary | ICD-10-CM | POA: Diagnosis not present

## 2016-08-04 DIAGNOSIS — D631 Anemia in chronic kidney disease: Secondary | ICD-10-CM | POA: Diagnosis not present

## 2016-08-04 DIAGNOSIS — D509 Iron deficiency anemia, unspecified: Secondary | ICD-10-CM | POA: Diagnosis not present

## 2016-08-04 DIAGNOSIS — N186 End stage renal disease: Secondary | ICD-10-CM | POA: Diagnosis not present

## 2016-08-07 DIAGNOSIS — N2581 Secondary hyperparathyroidism of renal origin: Secondary | ICD-10-CM | POA: Diagnosis not present

## 2016-08-07 DIAGNOSIS — D631 Anemia in chronic kidney disease: Secondary | ICD-10-CM | POA: Diagnosis not present

## 2016-08-07 DIAGNOSIS — D509 Iron deficiency anemia, unspecified: Secondary | ICD-10-CM | POA: Diagnosis not present

## 2016-08-07 DIAGNOSIS — E119 Type 2 diabetes mellitus without complications: Secondary | ICD-10-CM | POA: Diagnosis not present

## 2016-08-07 DIAGNOSIS — N186 End stage renal disease: Secondary | ICD-10-CM | POA: Diagnosis not present

## 2016-08-07 DIAGNOSIS — T8612 Kidney transplant failure: Secondary | ICD-10-CM | POA: Diagnosis not present

## 2016-08-07 DIAGNOSIS — Z992 Dependence on renal dialysis: Secondary | ICD-10-CM | POA: Diagnosis not present

## 2016-08-09 DIAGNOSIS — D631 Anemia in chronic kidney disease: Secondary | ICD-10-CM | POA: Diagnosis not present

## 2016-08-09 DIAGNOSIS — D509 Iron deficiency anemia, unspecified: Secondary | ICD-10-CM | POA: Diagnosis not present

## 2016-08-09 DIAGNOSIS — N186 End stage renal disease: Secondary | ICD-10-CM | POA: Diagnosis not present

## 2016-08-09 DIAGNOSIS — N2581 Secondary hyperparathyroidism of renal origin: Secondary | ICD-10-CM | POA: Diagnosis not present

## 2016-08-09 DIAGNOSIS — E119 Type 2 diabetes mellitus without complications: Secondary | ICD-10-CM | POA: Diagnosis not present

## 2016-08-11 DIAGNOSIS — N2581 Secondary hyperparathyroidism of renal origin: Secondary | ICD-10-CM | POA: Diagnosis not present

## 2016-08-11 DIAGNOSIS — D509 Iron deficiency anemia, unspecified: Secondary | ICD-10-CM | POA: Diagnosis not present

## 2016-08-11 DIAGNOSIS — D631 Anemia in chronic kidney disease: Secondary | ICD-10-CM | POA: Diagnosis not present

## 2016-08-11 DIAGNOSIS — N186 End stage renal disease: Secondary | ICD-10-CM | POA: Diagnosis not present

## 2016-08-11 DIAGNOSIS — E119 Type 2 diabetes mellitus without complications: Secondary | ICD-10-CM | POA: Diagnosis not present

## 2016-08-14 DIAGNOSIS — D509 Iron deficiency anemia, unspecified: Secondary | ICD-10-CM | POA: Diagnosis not present

## 2016-08-14 DIAGNOSIS — E119 Type 2 diabetes mellitus without complications: Secondary | ICD-10-CM | POA: Diagnosis not present

## 2016-08-14 DIAGNOSIS — N186 End stage renal disease: Secondary | ICD-10-CM | POA: Diagnosis not present

## 2016-08-14 DIAGNOSIS — D631 Anemia in chronic kidney disease: Secondary | ICD-10-CM | POA: Diagnosis not present

## 2016-08-14 DIAGNOSIS — N2581 Secondary hyperparathyroidism of renal origin: Secondary | ICD-10-CM | POA: Diagnosis not present

## 2016-08-16 DIAGNOSIS — D631 Anemia in chronic kidney disease: Secondary | ICD-10-CM | POA: Diagnosis not present

## 2016-08-16 DIAGNOSIS — N2581 Secondary hyperparathyroidism of renal origin: Secondary | ICD-10-CM | POA: Diagnosis not present

## 2016-08-16 DIAGNOSIS — D509 Iron deficiency anemia, unspecified: Secondary | ICD-10-CM | POA: Diagnosis not present

## 2016-08-16 DIAGNOSIS — E119 Type 2 diabetes mellitus without complications: Secondary | ICD-10-CM | POA: Diagnosis not present

## 2016-08-16 DIAGNOSIS — N186 End stage renal disease: Secondary | ICD-10-CM | POA: Diagnosis not present

## 2016-08-18 DIAGNOSIS — D631 Anemia in chronic kidney disease: Secondary | ICD-10-CM | POA: Diagnosis not present

## 2016-08-18 DIAGNOSIS — D509 Iron deficiency anemia, unspecified: Secondary | ICD-10-CM | POA: Diagnosis not present

## 2016-08-18 DIAGNOSIS — N2581 Secondary hyperparathyroidism of renal origin: Secondary | ICD-10-CM | POA: Diagnosis not present

## 2016-08-18 DIAGNOSIS — E119 Type 2 diabetes mellitus without complications: Secondary | ICD-10-CM | POA: Diagnosis not present

## 2016-08-18 DIAGNOSIS — N186 End stage renal disease: Secondary | ICD-10-CM | POA: Diagnosis not present

## 2016-08-21 DIAGNOSIS — N186 End stage renal disease: Secondary | ICD-10-CM | POA: Diagnosis not present

## 2016-08-21 DIAGNOSIS — N2581 Secondary hyperparathyroidism of renal origin: Secondary | ICD-10-CM | POA: Diagnosis not present

## 2016-08-21 DIAGNOSIS — E119 Type 2 diabetes mellitus without complications: Secondary | ICD-10-CM | POA: Diagnosis not present

## 2016-08-21 DIAGNOSIS — D631 Anemia in chronic kidney disease: Secondary | ICD-10-CM | POA: Diagnosis not present

## 2016-08-21 DIAGNOSIS — D509 Iron deficiency anemia, unspecified: Secondary | ICD-10-CM | POA: Diagnosis not present

## 2016-08-23 DIAGNOSIS — N2581 Secondary hyperparathyroidism of renal origin: Secondary | ICD-10-CM | POA: Diagnosis not present

## 2016-08-23 DIAGNOSIS — N186 End stage renal disease: Secondary | ICD-10-CM | POA: Diagnosis not present

## 2016-08-23 DIAGNOSIS — E119 Type 2 diabetes mellitus without complications: Secondary | ICD-10-CM | POA: Diagnosis not present

## 2016-08-23 DIAGNOSIS — D631 Anemia in chronic kidney disease: Secondary | ICD-10-CM | POA: Diagnosis not present

## 2016-08-23 DIAGNOSIS — D509 Iron deficiency anemia, unspecified: Secondary | ICD-10-CM | POA: Diagnosis not present

## 2016-08-25 DIAGNOSIS — E119 Type 2 diabetes mellitus without complications: Secondary | ICD-10-CM | POA: Diagnosis not present

## 2016-08-25 DIAGNOSIS — D509 Iron deficiency anemia, unspecified: Secondary | ICD-10-CM | POA: Diagnosis not present

## 2016-08-25 DIAGNOSIS — N2581 Secondary hyperparathyroidism of renal origin: Secondary | ICD-10-CM | POA: Diagnosis not present

## 2016-08-25 DIAGNOSIS — N186 End stage renal disease: Secondary | ICD-10-CM | POA: Diagnosis not present

## 2016-08-25 DIAGNOSIS — D631 Anemia in chronic kidney disease: Secondary | ICD-10-CM | POA: Diagnosis not present

## 2016-08-28 DIAGNOSIS — E119 Type 2 diabetes mellitus without complications: Secondary | ICD-10-CM | POA: Diagnosis not present

## 2016-08-28 DIAGNOSIS — N186 End stage renal disease: Secondary | ICD-10-CM | POA: Diagnosis not present

## 2016-08-28 DIAGNOSIS — D631 Anemia in chronic kidney disease: Secondary | ICD-10-CM | POA: Diagnosis not present

## 2016-08-28 DIAGNOSIS — N2581 Secondary hyperparathyroidism of renal origin: Secondary | ICD-10-CM | POA: Diagnosis not present

## 2016-08-28 DIAGNOSIS — D509 Iron deficiency anemia, unspecified: Secondary | ICD-10-CM | POA: Diagnosis not present

## 2016-08-30 DIAGNOSIS — D631 Anemia in chronic kidney disease: Secondary | ICD-10-CM | POA: Diagnosis not present

## 2016-08-30 DIAGNOSIS — D509 Iron deficiency anemia, unspecified: Secondary | ICD-10-CM | POA: Diagnosis not present

## 2016-08-30 DIAGNOSIS — N2581 Secondary hyperparathyroidism of renal origin: Secondary | ICD-10-CM | POA: Diagnosis not present

## 2016-08-30 DIAGNOSIS — E119 Type 2 diabetes mellitus without complications: Secondary | ICD-10-CM | POA: Diagnosis not present

## 2016-08-30 DIAGNOSIS — N186 End stage renal disease: Secondary | ICD-10-CM | POA: Diagnosis not present

## 2016-09-01 DIAGNOSIS — D509 Iron deficiency anemia, unspecified: Secondary | ICD-10-CM | POA: Diagnosis not present

## 2016-09-01 DIAGNOSIS — N2581 Secondary hyperparathyroidism of renal origin: Secondary | ICD-10-CM | POA: Diagnosis not present

## 2016-09-01 DIAGNOSIS — E119 Type 2 diabetes mellitus without complications: Secondary | ICD-10-CM | POA: Diagnosis not present

## 2016-09-01 DIAGNOSIS — N186 End stage renal disease: Secondary | ICD-10-CM | POA: Diagnosis not present

## 2016-09-01 DIAGNOSIS — D631 Anemia in chronic kidney disease: Secondary | ICD-10-CM | POA: Diagnosis not present

## 2016-09-04 DIAGNOSIS — E119 Type 2 diabetes mellitus without complications: Secondary | ICD-10-CM | POA: Diagnosis not present

## 2016-09-04 DIAGNOSIS — N186 End stage renal disease: Secondary | ICD-10-CM | POA: Diagnosis not present

## 2016-09-04 DIAGNOSIS — N2581 Secondary hyperparathyroidism of renal origin: Secondary | ICD-10-CM | POA: Diagnosis not present

## 2016-09-04 DIAGNOSIS — D631 Anemia in chronic kidney disease: Secondary | ICD-10-CM | POA: Diagnosis not present

## 2016-09-04 DIAGNOSIS — D509 Iron deficiency anemia, unspecified: Secondary | ICD-10-CM | POA: Diagnosis not present

## 2016-09-06 DIAGNOSIS — N186 End stage renal disease: Secondary | ICD-10-CM | POA: Diagnosis not present

## 2016-09-06 DIAGNOSIS — E119 Type 2 diabetes mellitus without complications: Secondary | ICD-10-CM | POA: Diagnosis not present

## 2016-09-06 DIAGNOSIS — D509 Iron deficiency anemia, unspecified: Secondary | ICD-10-CM | POA: Diagnosis not present

## 2016-09-06 DIAGNOSIS — N2581 Secondary hyperparathyroidism of renal origin: Secondary | ICD-10-CM | POA: Diagnosis not present

## 2016-09-06 DIAGNOSIS — D631 Anemia in chronic kidney disease: Secondary | ICD-10-CM | POA: Diagnosis not present

## 2016-09-07 DIAGNOSIS — N186 End stage renal disease: Secondary | ICD-10-CM | POA: Diagnosis not present

## 2016-09-07 DIAGNOSIS — T8612 Kidney transplant failure: Secondary | ICD-10-CM | POA: Diagnosis not present

## 2016-09-07 DIAGNOSIS — Z992 Dependence on renal dialysis: Secondary | ICD-10-CM | POA: Diagnosis not present

## 2016-09-08 DIAGNOSIS — E119 Type 2 diabetes mellitus without complications: Secondary | ICD-10-CM | POA: Diagnosis not present

## 2016-09-08 DIAGNOSIS — N186 End stage renal disease: Secondary | ICD-10-CM | POA: Diagnosis not present

## 2016-09-08 DIAGNOSIS — Z23 Encounter for immunization: Secondary | ICD-10-CM | POA: Diagnosis not present

## 2016-09-08 DIAGNOSIS — D631 Anemia in chronic kidney disease: Secondary | ICD-10-CM | POA: Diagnosis not present

## 2016-09-08 DIAGNOSIS — D509 Iron deficiency anemia, unspecified: Secondary | ICD-10-CM | POA: Diagnosis not present

## 2016-09-08 DIAGNOSIS — N2581 Secondary hyperparathyroidism of renal origin: Secondary | ICD-10-CM | POA: Diagnosis not present

## 2016-09-11 DIAGNOSIS — N186 End stage renal disease: Secondary | ICD-10-CM | POA: Diagnosis not present

## 2016-09-11 DIAGNOSIS — Z23 Encounter for immunization: Secondary | ICD-10-CM | POA: Diagnosis not present

## 2016-09-11 DIAGNOSIS — D631 Anemia in chronic kidney disease: Secondary | ICD-10-CM | POA: Diagnosis not present

## 2016-09-11 DIAGNOSIS — N2581 Secondary hyperparathyroidism of renal origin: Secondary | ICD-10-CM | POA: Diagnosis not present

## 2016-09-11 DIAGNOSIS — E119 Type 2 diabetes mellitus without complications: Secondary | ICD-10-CM | POA: Diagnosis not present

## 2016-09-11 DIAGNOSIS — D509 Iron deficiency anemia, unspecified: Secondary | ICD-10-CM | POA: Diagnosis not present

## 2016-09-13 DIAGNOSIS — Z23 Encounter for immunization: Secondary | ICD-10-CM | POA: Diagnosis not present

## 2016-09-13 DIAGNOSIS — N2581 Secondary hyperparathyroidism of renal origin: Secondary | ICD-10-CM | POA: Diagnosis not present

## 2016-09-13 DIAGNOSIS — E119 Type 2 diabetes mellitus without complications: Secondary | ICD-10-CM | POA: Diagnosis not present

## 2016-09-13 DIAGNOSIS — D509 Iron deficiency anemia, unspecified: Secondary | ICD-10-CM | POA: Diagnosis not present

## 2016-09-13 DIAGNOSIS — D631 Anemia in chronic kidney disease: Secondary | ICD-10-CM | POA: Diagnosis not present

## 2016-09-13 DIAGNOSIS — N186 End stage renal disease: Secondary | ICD-10-CM | POA: Diagnosis not present

## 2016-09-15 DIAGNOSIS — Z23 Encounter for immunization: Secondary | ICD-10-CM | POA: Diagnosis not present

## 2016-09-15 DIAGNOSIS — D509 Iron deficiency anemia, unspecified: Secondary | ICD-10-CM | POA: Diagnosis not present

## 2016-09-15 DIAGNOSIS — D631 Anemia in chronic kidney disease: Secondary | ICD-10-CM | POA: Diagnosis not present

## 2016-09-15 DIAGNOSIS — N186 End stage renal disease: Secondary | ICD-10-CM | POA: Diagnosis not present

## 2016-09-15 DIAGNOSIS — N2581 Secondary hyperparathyroidism of renal origin: Secondary | ICD-10-CM | POA: Diagnosis not present

## 2016-09-15 DIAGNOSIS — E119 Type 2 diabetes mellitus without complications: Secondary | ICD-10-CM | POA: Diagnosis not present

## 2016-09-18 DIAGNOSIS — Z23 Encounter for immunization: Secondary | ICD-10-CM | POA: Diagnosis not present

## 2016-09-18 DIAGNOSIS — D509 Iron deficiency anemia, unspecified: Secondary | ICD-10-CM | POA: Diagnosis not present

## 2016-09-18 DIAGNOSIS — N186 End stage renal disease: Secondary | ICD-10-CM | POA: Diagnosis not present

## 2016-09-18 DIAGNOSIS — D631 Anemia in chronic kidney disease: Secondary | ICD-10-CM | POA: Diagnosis not present

## 2016-09-18 DIAGNOSIS — E119 Type 2 diabetes mellitus without complications: Secondary | ICD-10-CM | POA: Diagnosis not present

## 2016-09-18 DIAGNOSIS — N2581 Secondary hyperparathyroidism of renal origin: Secondary | ICD-10-CM | POA: Diagnosis not present

## 2016-09-20 DIAGNOSIS — N2581 Secondary hyperparathyroidism of renal origin: Secondary | ICD-10-CM | POA: Diagnosis not present

## 2016-09-20 DIAGNOSIS — Z23 Encounter for immunization: Secondary | ICD-10-CM | POA: Diagnosis not present

## 2016-09-20 DIAGNOSIS — N186 End stage renal disease: Secondary | ICD-10-CM | POA: Diagnosis not present

## 2016-09-20 DIAGNOSIS — E119 Type 2 diabetes mellitus without complications: Secondary | ICD-10-CM | POA: Diagnosis not present

## 2016-09-20 DIAGNOSIS — D509 Iron deficiency anemia, unspecified: Secondary | ICD-10-CM | POA: Diagnosis not present

## 2016-09-20 DIAGNOSIS — D631 Anemia in chronic kidney disease: Secondary | ICD-10-CM | POA: Diagnosis not present

## 2016-09-22 DIAGNOSIS — N2581 Secondary hyperparathyroidism of renal origin: Secondary | ICD-10-CM | POA: Diagnosis not present

## 2016-09-22 DIAGNOSIS — E119 Type 2 diabetes mellitus without complications: Secondary | ICD-10-CM | POA: Diagnosis not present

## 2016-09-22 DIAGNOSIS — D631 Anemia in chronic kidney disease: Secondary | ICD-10-CM | POA: Diagnosis not present

## 2016-09-22 DIAGNOSIS — N186 End stage renal disease: Secondary | ICD-10-CM | POA: Diagnosis not present

## 2016-09-22 DIAGNOSIS — D509 Iron deficiency anemia, unspecified: Secondary | ICD-10-CM | POA: Diagnosis not present

## 2016-09-22 DIAGNOSIS — Z23 Encounter for immunization: Secondary | ICD-10-CM | POA: Diagnosis not present

## 2016-09-25 DIAGNOSIS — D509 Iron deficiency anemia, unspecified: Secondary | ICD-10-CM | POA: Diagnosis not present

## 2016-09-25 DIAGNOSIS — D631 Anemia in chronic kidney disease: Secondary | ICD-10-CM | POA: Diagnosis not present

## 2016-09-25 DIAGNOSIS — E119 Type 2 diabetes mellitus without complications: Secondary | ICD-10-CM | POA: Diagnosis not present

## 2016-09-25 DIAGNOSIS — N2581 Secondary hyperparathyroidism of renal origin: Secondary | ICD-10-CM | POA: Diagnosis not present

## 2016-09-25 DIAGNOSIS — N186 End stage renal disease: Secondary | ICD-10-CM | POA: Diagnosis not present

## 2016-09-25 DIAGNOSIS — Z23 Encounter for immunization: Secondary | ICD-10-CM | POA: Diagnosis not present

## 2016-09-27 DIAGNOSIS — N2581 Secondary hyperparathyroidism of renal origin: Secondary | ICD-10-CM | POA: Diagnosis not present

## 2016-09-27 DIAGNOSIS — N186 End stage renal disease: Secondary | ICD-10-CM | POA: Diagnosis not present

## 2016-09-27 DIAGNOSIS — D631 Anemia in chronic kidney disease: Secondary | ICD-10-CM | POA: Diagnosis not present

## 2016-09-27 DIAGNOSIS — E119 Type 2 diabetes mellitus without complications: Secondary | ICD-10-CM | POA: Diagnosis not present

## 2016-09-27 DIAGNOSIS — D509 Iron deficiency anemia, unspecified: Secondary | ICD-10-CM | POA: Diagnosis not present

## 2016-09-27 DIAGNOSIS — Z23 Encounter for immunization: Secondary | ICD-10-CM | POA: Diagnosis not present

## 2016-09-29 DIAGNOSIS — Z23 Encounter for immunization: Secondary | ICD-10-CM | POA: Diagnosis not present

## 2016-09-29 DIAGNOSIS — N186 End stage renal disease: Secondary | ICD-10-CM | POA: Diagnosis not present

## 2016-09-29 DIAGNOSIS — N2581 Secondary hyperparathyroidism of renal origin: Secondary | ICD-10-CM | POA: Diagnosis not present

## 2016-09-29 DIAGNOSIS — E119 Type 2 diabetes mellitus without complications: Secondary | ICD-10-CM | POA: Diagnosis not present

## 2016-09-29 DIAGNOSIS — D631 Anemia in chronic kidney disease: Secondary | ICD-10-CM | POA: Diagnosis not present

## 2016-09-29 DIAGNOSIS — D509 Iron deficiency anemia, unspecified: Secondary | ICD-10-CM | POA: Diagnosis not present

## 2016-10-02 DIAGNOSIS — D631 Anemia in chronic kidney disease: Secondary | ICD-10-CM | POA: Diagnosis not present

## 2016-10-02 DIAGNOSIS — N2581 Secondary hyperparathyroidism of renal origin: Secondary | ICD-10-CM | POA: Diagnosis not present

## 2016-10-02 DIAGNOSIS — D509 Iron deficiency anemia, unspecified: Secondary | ICD-10-CM | POA: Diagnosis not present

## 2016-10-02 DIAGNOSIS — N186 End stage renal disease: Secondary | ICD-10-CM | POA: Diagnosis not present

## 2016-10-02 DIAGNOSIS — E119 Type 2 diabetes mellitus without complications: Secondary | ICD-10-CM | POA: Diagnosis not present

## 2016-10-02 DIAGNOSIS — Z23 Encounter for immunization: Secondary | ICD-10-CM | POA: Diagnosis not present

## 2016-10-04 DIAGNOSIS — D631 Anemia in chronic kidney disease: Secondary | ICD-10-CM | POA: Diagnosis not present

## 2016-10-04 DIAGNOSIS — D509 Iron deficiency anemia, unspecified: Secondary | ICD-10-CM | POA: Diagnosis not present

## 2016-10-04 DIAGNOSIS — N186 End stage renal disease: Secondary | ICD-10-CM | POA: Diagnosis not present

## 2016-10-04 DIAGNOSIS — E119 Type 2 diabetes mellitus without complications: Secondary | ICD-10-CM | POA: Diagnosis not present

## 2016-10-04 DIAGNOSIS — N2581 Secondary hyperparathyroidism of renal origin: Secondary | ICD-10-CM | POA: Diagnosis not present

## 2016-10-04 DIAGNOSIS — Z23 Encounter for immunization: Secondary | ICD-10-CM | POA: Diagnosis not present

## 2016-10-06 DIAGNOSIS — D631 Anemia in chronic kidney disease: Secondary | ICD-10-CM | POA: Diagnosis not present

## 2016-10-06 DIAGNOSIS — E119 Type 2 diabetes mellitus without complications: Secondary | ICD-10-CM | POA: Diagnosis not present

## 2016-10-06 DIAGNOSIS — N2581 Secondary hyperparathyroidism of renal origin: Secondary | ICD-10-CM | POA: Diagnosis not present

## 2016-10-06 DIAGNOSIS — N186 End stage renal disease: Secondary | ICD-10-CM | POA: Diagnosis not present

## 2016-10-06 DIAGNOSIS — Z23 Encounter for immunization: Secondary | ICD-10-CM | POA: Diagnosis not present

## 2016-10-06 DIAGNOSIS — D509 Iron deficiency anemia, unspecified: Secondary | ICD-10-CM | POA: Diagnosis not present

## 2016-10-07 DIAGNOSIS — Z992 Dependence on renal dialysis: Secondary | ICD-10-CM | POA: Diagnosis not present

## 2016-10-07 DIAGNOSIS — N186 End stage renal disease: Secondary | ICD-10-CM | POA: Diagnosis not present

## 2016-10-07 DIAGNOSIS — T8612 Kidney transplant failure: Secondary | ICD-10-CM | POA: Diagnosis not present

## 2016-10-08 DIAGNOSIS — N5201 Erectile dysfunction due to arterial insufficiency: Secondary | ICD-10-CM | POA: Diagnosis not present

## 2016-10-09 DIAGNOSIS — D509 Iron deficiency anemia, unspecified: Secondary | ICD-10-CM | POA: Diagnosis not present

## 2016-10-09 DIAGNOSIS — E119 Type 2 diabetes mellitus without complications: Secondary | ICD-10-CM | POA: Diagnosis not present

## 2016-10-09 DIAGNOSIS — N2581 Secondary hyperparathyroidism of renal origin: Secondary | ICD-10-CM | POA: Diagnosis not present

## 2016-10-09 DIAGNOSIS — D631 Anemia in chronic kidney disease: Secondary | ICD-10-CM | POA: Diagnosis not present

## 2016-10-09 DIAGNOSIS — N186 End stage renal disease: Secondary | ICD-10-CM | POA: Diagnosis not present

## 2016-10-11 DIAGNOSIS — E119 Type 2 diabetes mellitus without complications: Secondary | ICD-10-CM | POA: Diagnosis not present

## 2016-10-11 DIAGNOSIS — N186 End stage renal disease: Secondary | ICD-10-CM | POA: Diagnosis not present

## 2016-10-11 DIAGNOSIS — D631 Anemia in chronic kidney disease: Secondary | ICD-10-CM | POA: Diagnosis not present

## 2016-10-11 DIAGNOSIS — N2581 Secondary hyperparathyroidism of renal origin: Secondary | ICD-10-CM | POA: Diagnosis not present

## 2016-10-11 DIAGNOSIS — D509 Iron deficiency anemia, unspecified: Secondary | ICD-10-CM | POA: Diagnosis not present

## 2016-10-13 DIAGNOSIS — D509 Iron deficiency anemia, unspecified: Secondary | ICD-10-CM | POA: Diagnosis not present

## 2016-10-13 DIAGNOSIS — D631 Anemia in chronic kidney disease: Secondary | ICD-10-CM | POA: Diagnosis not present

## 2016-10-13 DIAGNOSIS — N186 End stage renal disease: Secondary | ICD-10-CM | POA: Diagnosis not present

## 2016-10-13 DIAGNOSIS — E119 Type 2 diabetes mellitus without complications: Secondary | ICD-10-CM | POA: Diagnosis not present

## 2016-10-13 DIAGNOSIS — N2581 Secondary hyperparathyroidism of renal origin: Secondary | ICD-10-CM | POA: Diagnosis not present

## 2016-10-16 DIAGNOSIS — D509 Iron deficiency anemia, unspecified: Secondary | ICD-10-CM | POA: Diagnosis not present

## 2016-10-16 DIAGNOSIS — E119 Type 2 diabetes mellitus without complications: Secondary | ICD-10-CM | POA: Diagnosis not present

## 2016-10-16 DIAGNOSIS — N186 End stage renal disease: Secondary | ICD-10-CM | POA: Diagnosis not present

## 2016-10-16 DIAGNOSIS — D631 Anemia in chronic kidney disease: Secondary | ICD-10-CM | POA: Diagnosis not present

## 2016-10-16 DIAGNOSIS — N2581 Secondary hyperparathyroidism of renal origin: Secondary | ICD-10-CM | POA: Diagnosis not present

## 2016-10-18 DIAGNOSIS — N186 End stage renal disease: Secondary | ICD-10-CM | POA: Diagnosis not present

## 2016-10-18 DIAGNOSIS — N2581 Secondary hyperparathyroidism of renal origin: Secondary | ICD-10-CM | POA: Diagnosis not present

## 2016-10-18 DIAGNOSIS — D509 Iron deficiency anemia, unspecified: Secondary | ICD-10-CM | POA: Diagnosis not present

## 2016-10-18 DIAGNOSIS — D631 Anemia in chronic kidney disease: Secondary | ICD-10-CM | POA: Diagnosis not present

## 2016-10-18 DIAGNOSIS — E119 Type 2 diabetes mellitus without complications: Secondary | ICD-10-CM | POA: Diagnosis not present

## 2016-10-20 DIAGNOSIS — D509 Iron deficiency anemia, unspecified: Secondary | ICD-10-CM | POA: Diagnosis not present

## 2016-10-20 DIAGNOSIS — E119 Type 2 diabetes mellitus without complications: Secondary | ICD-10-CM | POA: Diagnosis not present

## 2016-10-20 DIAGNOSIS — D631 Anemia in chronic kidney disease: Secondary | ICD-10-CM | POA: Diagnosis not present

## 2016-10-20 DIAGNOSIS — N186 End stage renal disease: Secondary | ICD-10-CM | POA: Diagnosis not present

## 2016-10-20 DIAGNOSIS — N2581 Secondary hyperparathyroidism of renal origin: Secondary | ICD-10-CM | POA: Diagnosis not present

## 2016-10-23 DIAGNOSIS — N2581 Secondary hyperparathyroidism of renal origin: Secondary | ICD-10-CM | POA: Diagnosis not present

## 2016-10-23 DIAGNOSIS — D631 Anemia in chronic kidney disease: Secondary | ICD-10-CM | POA: Diagnosis not present

## 2016-10-23 DIAGNOSIS — E119 Type 2 diabetes mellitus without complications: Secondary | ICD-10-CM | POA: Diagnosis not present

## 2016-10-23 DIAGNOSIS — D509 Iron deficiency anemia, unspecified: Secondary | ICD-10-CM | POA: Diagnosis not present

## 2016-10-23 DIAGNOSIS — N186 End stage renal disease: Secondary | ICD-10-CM | POA: Diagnosis not present

## 2016-10-25 DIAGNOSIS — N186 End stage renal disease: Secondary | ICD-10-CM | POA: Diagnosis not present

## 2016-10-25 DIAGNOSIS — D509 Iron deficiency anemia, unspecified: Secondary | ICD-10-CM | POA: Diagnosis not present

## 2016-10-25 DIAGNOSIS — E119 Type 2 diabetes mellitus without complications: Secondary | ICD-10-CM | POA: Diagnosis not present

## 2016-10-25 DIAGNOSIS — D631 Anemia in chronic kidney disease: Secondary | ICD-10-CM | POA: Diagnosis not present

## 2016-10-25 DIAGNOSIS — N2581 Secondary hyperparathyroidism of renal origin: Secondary | ICD-10-CM | POA: Diagnosis not present

## 2016-10-27 DIAGNOSIS — D631 Anemia in chronic kidney disease: Secondary | ICD-10-CM | POA: Diagnosis not present

## 2016-10-27 DIAGNOSIS — E119 Type 2 diabetes mellitus without complications: Secondary | ICD-10-CM | POA: Diagnosis not present

## 2016-10-27 DIAGNOSIS — N186 End stage renal disease: Secondary | ICD-10-CM | POA: Diagnosis not present

## 2016-10-27 DIAGNOSIS — D509 Iron deficiency anemia, unspecified: Secondary | ICD-10-CM | POA: Diagnosis not present

## 2016-10-27 DIAGNOSIS — N2581 Secondary hyperparathyroidism of renal origin: Secondary | ICD-10-CM | POA: Diagnosis not present

## 2016-10-30 DIAGNOSIS — N186 End stage renal disease: Secondary | ICD-10-CM | POA: Diagnosis not present

## 2016-10-30 DIAGNOSIS — E119 Type 2 diabetes mellitus without complications: Secondary | ICD-10-CM | POA: Diagnosis not present

## 2016-10-30 DIAGNOSIS — D509 Iron deficiency anemia, unspecified: Secondary | ICD-10-CM | POA: Diagnosis not present

## 2016-10-30 DIAGNOSIS — N2581 Secondary hyperparathyroidism of renal origin: Secondary | ICD-10-CM | POA: Diagnosis not present

## 2016-10-30 DIAGNOSIS — D631 Anemia in chronic kidney disease: Secondary | ICD-10-CM | POA: Diagnosis not present

## 2016-10-31 DIAGNOSIS — N5201 Erectile dysfunction due to arterial insufficiency: Secondary | ICD-10-CM | POA: Diagnosis not present

## 2016-11-01 DIAGNOSIS — D509 Iron deficiency anemia, unspecified: Secondary | ICD-10-CM | POA: Diagnosis not present

## 2016-11-01 DIAGNOSIS — D631 Anemia in chronic kidney disease: Secondary | ICD-10-CM | POA: Diagnosis not present

## 2016-11-01 DIAGNOSIS — N186 End stage renal disease: Secondary | ICD-10-CM | POA: Diagnosis not present

## 2016-11-01 DIAGNOSIS — N2581 Secondary hyperparathyroidism of renal origin: Secondary | ICD-10-CM | POA: Diagnosis not present

## 2016-11-01 DIAGNOSIS — E119 Type 2 diabetes mellitus without complications: Secondary | ICD-10-CM | POA: Diagnosis not present

## 2016-11-03 DIAGNOSIS — N186 End stage renal disease: Secondary | ICD-10-CM | POA: Diagnosis not present

## 2016-11-03 DIAGNOSIS — D509 Iron deficiency anemia, unspecified: Secondary | ICD-10-CM | POA: Diagnosis not present

## 2016-11-03 DIAGNOSIS — N2581 Secondary hyperparathyroidism of renal origin: Secondary | ICD-10-CM | POA: Diagnosis not present

## 2016-11-03 DIAGNOSIS — D631 Anemia in chronic kidney disease: Secondary | ICD-10-CM | POA: Diagnosis not present

## 2016-11-03 DIAGNOSIS — E119 Type 2 diabetes mellitus without complications: Secondary | ICD-10-CM | POA: Diagnosis not present

## 2016-11-06 DIAGNOSIS — N186 End stage renal disease: Secondary | ICD-10-CM | POA: Diagnosis not present

## 2016-11-06 DIAGNOSIS — N2581 Secondary hyperparathyroidism of renal origin: Secondary | ICD-10-CM | POA: Diagnosis not present

## 2016-11-06 DIAGNOSIS — D631 Anemia in chronic kidney disease: Secondary | ICD-10-CM | POA: Diagnosis not present

## 2016-11-06 DIAGNOSIS — E119 Type 2 diabetes mellitus without complications: Secondary | ICD-10-CM | POA: Diagnosis not present

## 2016-11-06 DIAGNOSIS — D509 Iron deficiency anemia, unspecified: Secondary | ICD-10-CM | POA: Diagnosis not present

## 2016-11-07 DIAGNOSIS — T8612 Kidney transplant failure: Secondary | ICD-10-CM | POA: Diagnosis not present

## 2016-11-07 DIAGNOSIS — N186 End stage renal disease: Secondary | ICD-10-CM | POA: Diagnosis not present

## 2016-11-07 DIAGNOSIS — Z992 Dependence on renal dialysis: Secondary | ICD-10-CM | POA: Diagnosis not present

## 2016-11-08 DIAGNOSIS — N2581 Secondary hyperparathyroidism of renal origin: Secondary | ICD-10-CM | POA: Diagnosis not present

## 2016-11-08 DIAGNOSIS — N186 End stage renal disease: Secondary | ICD-10-CM | POA: Diagnosis not present

## 2016-11-08 DIAGNOSIS — E119 Type 2 diabetes mellitus without complications: Secondary | ICD-10-CM | POA: Diagnosis not present

## 2016-11-10 DIAGNOSIS — N2581 Secondary hyperparathyroidism of renal origin: Secondary | ICD-10-CM | POA: Diagnosis not present

## 2016-11-10 DIAGNOSIS — N186 End stage renal disease: Secondary | ICD-10-CM | POA: Diagnosis not present

## 2016-11-10 DIAGNOSIS — E119 Type 2 diabetes mellitus without complications: Secondary | ICD-10-CM | POA: Diagnosis not present

## 2016-11-13 DIAGNOSIS — N2581 Secondary hyperparathyroidism of renal origin: Secondary | ICD-10-CM | POA: Diagnosis not present

## 2016-11-13 DIAGNOSIS — N186 End stage renal disease: Secondary | ICD-10-CM | POA: Diagnosis not present

## 2016-11-13 DIAGNOSIS — E119 Type 2 diabetes mellitus without complications: Secondary | ICD-10-CM | POA: Diagnosis not present

## 2016-11-15 DIAGNOSIS — E119 Type 2 diabetes mellitus without complications: Secondary | ICD-10-CM | POA: Diagnosis not present

## 2016-11-15 DIAGNOSIS — N2581 Secondary hyperparathyroidism of renal origin: Secondary | ICD-10-CM | POA: Diagnosis not present

## 2016-11-15 DIAGNOSIS — N186 End stage renal disease: Secondary | ICD-10-CM | POA: Diagnosis not present

## 2016-11-17 DIAGNOSIS — N186 End stage renal disease: Secondary | ICD-10-CM | POA: Diagnosis not present

## 2016-11-17 DIAGNOSIS — E119 Type 2 diabetes mellitus without complications: Secondary | ICD-10-CM | POA: Diagnosis not present

## 2016-11-17 DIAGNOSIS — N2581 Secondary hyperparathyroidism of renal origin: Secondary | ICD-10-CM | POA: Diagnosis not present

## 2016-11-20 DIAGNOSIS — N2581 Secondary hyperparathyroidism of renal origin: Secondary | ICD-10-CM | POA: Diagnosis not present

## 2016-11-20 DIAGNOSIS — E119 Type 2 diabetes mellitus without complications: Secondary | ICD-10-CM | POA: Diagnosis not present

## 2016-11-20 DIAGNOSIS — N186 End stage renal disease: Secondary | ICD-10-CM | POA: Diagnosis not present

## 2016-11-22 DIAGNOSIS — N186 End stage renal disease: Secondary | ICD-10-CM | POA: Diagnosis not present

## 2016-11-22 DIAGNOSIS — E119 Type 2 diabetes mellitus without complications: Secondary | ICD-10-CM | POA: Diagnosis not present

## 2016-11-22 DIAGNOSIS — N2581 Secondary hyperparathyroidism of renal origin: Secondary | ICD-10-CM | POA: Diagnosis not present

## 2016-11-24 DIAGNOSIS — N186 End stage renal disease: Secondary | ICD-10-CM | POA: Diagnosis not present

## 2016-11-24 DIAGNOSIS — N2581 Secondary hyperparathyroidism of renal origin: Secondary | ICD-10-CM | POA: Diagnosis not present

## 2016-11-24 DIAGNOSIS — E119 Type 2 diabetes mellitus without complications: Secondary | ICD-10-CM | POA: Diagnosis not present

## 2016-11-26 DIAGNOSIS — N2581 Secondary hyperparathyroidism of renal origin: Secondary | ICD-10-CM | POA: Diagnosis not present

## 2016-11-26 DIAGNOSIS — N186 End stage renal disease: Secondary | ICD-10-CM | POA: Diagnosis not present

## 2016-11-26 DIAGNOSIS — E119 Type 2 diabetes mellitus without complications: Secondary | ICD-10-CM | POA: Diagnosis not present

## 2016-11-28 DIAGNOSIS — N2581 Secondary hyperparathyroidism of renal origin: Secondary | ICD-10-CM | POA: Diagnosis not present

## 2016-11-28 DIAGNOSIS — N186 End stage renal disease: Secondary | ICD-10-CM | POA: Diagnosis not present

## 2016-11-28 DIAGNOSIS — E119 Type 2 diabetes mellitus without complications: Secondary | ICD-10-CM | POA: Diagnosis not present

## 2016-12-01 DIAGNOSIS — E119 Type 2 diabetes mellitus without complications: Secondary | ICD-10-CM | POA: Diagnosis not present

## 2016-12-01 DIAGNOSIS — N2581 Secondary hyperparathyroidism of renal origin: Secondary | ICD-10-CM | POA: Diagnosis not present

## 2016-12-01 DIAGNOSIS — N186 End stage renal disease: Secondary | ICD-10-CM | POA: Diagnosis not present

## 2016-12-04 DIAGNOSIS — E119 Type 2 diabetes mellitus without complications: Secondary | ICD-10-CM | POA: Diagnosis not present

## 2016-12-04 DIAGNOSIS — N186 End stage renal disease: Secondary | ICD-10-CM | POA: Diagnosis not present

## 2016-12-04 DIAGNOSIS — N2581 Secondary hyperparathyroidism of renal origin: Secondary | ICD-10-CM | POA: Diagnosis not present

## 2016-12-06 DIAGNOSIS — E119 Type 2 diabetes mellitus without complications: Secondary | ICD-10-CM | POA: Diagnosis not present

## 2016-12-06 DIAGNOSIS — N2581 Secondary hyperparathyroidism of renal origin: Secondary | ICD-10-CM | POA: Diagnosis not present

## 2016-12-06 DIAGNOSIS — N186 End stage renal disease: Secondary | ICD-10-CM | POA: Diagnosis not present

## 2016-12-07 DIAGNOSIS — Z992 Dependence on renal dialysis: Secondary | ICD-10-CM | POA: Diagnosis not present

## 2016-12-07 DIAGNOSIS — T8612 Kidney transplant failure: Secondary | ICD-10-CM | POA: Diagnosis not present

## 2016-12-07 DIAGNOSIS — N186 End stage renal disease: Secondary | ICD-10-CM | POA: Diagnosis not present

## 2016-12-08 ENCOUNTER — Other Ambulatory Visit: Payer: Self-pay

## 2016-12-08 ENCOUNTER — Encounter (HOSPITAL_BASED_OUTPATIENT_CLINIC_OR_DEPARTMENT_OTHER): Payer: Self-pay

## 2016-12-08 ENCOUNTER — Emergency Department (HOSPITAL_BASED_OUTPATIENT_CLINIC_OR_DEPARTMENT_OTHER)
Admission: EM | Admit: 2016-12-08 | Discharge: 2016-12-08 | Disposition: A | Discharge status: Home / Self Care | Payer: Medicare Other | Attending: Emergency Medicine | Admitting: Emergency Medicine

## 2016-12-08 DIAGNOSIS — I69351 Hemiplegia and hemiparesis following cerebral infarction affecting right dominant side: Secondary | ICD-10-CM | POA: Insufficient documentation

## 2016-12-08 DIAGNOSIS — I120 Hypertensive chronic kidney disease with stage 5 chronic kidney disease or end stage renal disease: Secondary | ICD-10-CM | POA: Diagnosis not present

## 2016-12-08 DIAGNOSIS — I110 Hypertensive heart disease with heart failure: Secondary | ICD-10-CM | POA: Diagnosis not present

## 2016-12-08 DIAGNOSIS — Z79899 Other long term (current) drug therapy: Secondary | ICD-10-CM | POA: Insufficient documentation

## 2016-12-08 DIAGNOSIS — I509 Heart failure, unspecified: Secondary | ICD-10-CM | POA: Insufficient documentation

## 2016-12-08 DIAGNOSIS — Z940 Kidney transplant status: Secondary | ICD-10-CM | POA: Diagnosis not present

## 2016-12-08 DIAGNOSIS — E1122 Type 2 diabetes mellitus with diabetic chronic kidney disease: Secondary | ICD-10-CM | POA: Diagnosis not present

## 2016-12-08 DIAGNOSIS — J45909 Unspecified asthma, uncomplicated: Secondary | ICD-10-CM | POA: Insufficient documentation

## 2016-12-08 DIAGNOSIS — H5789 Other specified disorders of eye and adnexa: Secondary | ICD-10-CM | POA: Diagnosis present

## 2016-12-08 DIAGNOSIS — H1013 Acute atopic conjunctivitis, bilateral: Secondary | ICD-10-CM

## 2016-12-08 DIAGNOSIS — N186 End stage renal disease: Secondary | ICD-10-CM | POA: Diagnosis not present

## 2016-12-08 DIAGNOSIS — Z87891 Personal history of nicotine dependence: Secondary | ICD-10-CM | POA: Insufficient documentation

## 2016-12-08 DIAGNOSIS — R0981 Nasal congestion: Secondary | ICD-10-CM | POA: Diagnosis not present

## 2016-12-08 DIAGNOSIS — J449 Chronic obstructive pulmonary disease, unspecified: Secondary | ICD-10-CM | POA: Diagnosis not present

## 2016-12-08 DIAGNOSIS — N2581 Secondary hyperparathyroidism of renal origin: Secondary | ICD-10-CM | POA: Diagnosis not present

## 2016-12-08 MED ORDER — TOBRAMYCIN 0.3 % OP SOLN
1.0000 [drp] | OPHTHALMIC | Status: DC
  Administered 2016-12-08: 2 [drp] via OPHTHALMIC
  Filled 2016-12-08: qty 5

## 2016-12-08 NOTE — ED Notes (Signed)
C/o drainage and redness to bilateral onset just pta,  Eye redness,  Left is worse than rt,  Not as bad as on arrival,  Mainly scratchy now

## 2016-12-08 NOTE — ED Triage Notes (Signed)
Pt c/o allergies to bilateral eyes, can't see; c/o congestion and cough x2hrs

## 2016-12-08 NOTE — ED Provider Notes (Signed)
[facility] DEPT MHP Provider Note: Nathaniel Spurling, MD, FACEP  CSN: [id number] MRN: [id number] ARRIVAL: 12/08/16 at [place]: [name]  12/08/16 4:33 AM Nathaniel White is a 56 y.o. male with end-stage renal disease on hemodialysis.  He is here with a 1 day history of nasal congestion, eye irritation with conjunctival erythema, a gritty sensation and drainage.  He believes these are symptoms of an allergic reaction.  He has not taken any antihistamine for this.  He had a similar reaction earlier this year and was treated with tobramycin drops which he states worked well.  He is not having difficulty breathing.  He is due for dialysis in about an hour.   Past Medical History:  Diagnosis Date  . Anemia   . Anxiety   . Asthma   . CHF (congestive heart failure) ([place])   . Complication of anesthesia    according to pt and spouse pt was moving around and cough while under and pt had difficulty waking up so the anesthesia had to be reversed. and pt admitted to ICU.   Marland Kitchen COPD (chronic obstructive pulmonary disease) ([place])   . Diabetes mellitus without complication ([place])    not on medications  . ESRD    on HD, M- W- F   . Hemiparesis due to old stroke ([place])    left  . Hypertension   . LV dysfunction    EF 25-30% by echo 07/2011  . Memory loss due to medical condition    due to stroke  . MR (mitral regurgitation)    moderate to severe, echo 07/2011  . Shortness of breath   . Stroke [facility])    TIA's-left sided weakness  . Tobacco abuse     Past Surgical History:  Procedure Laterality Date  . AV FISTULA PLACEMENT    . CARDIAC CATHETERIZATION     [place] [facility]  . COLONOSCOPY W/ BIOPSIES AND POLYPECTOMY    . FISTULA SUPERFICIALIZATION Left 11/10/2013   Procedure: FISTULA PLICATION;  Surgeon: Serafina Mitchell, MD;  Location: [facility];  Service: Vascular;  Laterality: Left;  . KIDNEY TRANSPLANT     2011 rejected kidney 2012  back on dialysis  . LEFT AND RIGHT HEART CATHETERIZATION WITH CORONARY ANGIOGRAM N/A 10/09/2013   Procedure: LEFT AND RIGHT HEART CATHETERIZATION WITH CORONARY ANGIOGRAM;  Surgeon: Burnell Blanks, MD;  Location: [facility] CATH LAB;  Service: Cardiovascular;  Laterality: N/A;  . REVISON OF ARTERIOVENOUS FISTULA Left 08/26/2013   Procedure: EXCISION OF ERODED SKIN AND EXPLORATION OF MAIN LEFT UPPER ARM AV FISTULA;  Surgeon: Rosetta Posner, MD;  Location: [place];  Service: Vascular;  Laterality: Left;  . REVISON OF ARTERIOVENOUS FISTULA Left 02/05/2014   Procedure: REPAIR OF ARTERIOVENOUS FISTULA ANEURYSM;  Surgeon: Serafina Mitchell, MD;  Location: [place];  Service: Vascular;  Laterality: Left;  . SHUNTOGRAM N/A 11/05/2012   Procedure: Fistulogram;  Surgeon: Serafina Mitchell, MD;  Location: [facility] CATH LAB;  Service: Cardiovascular;  Laterality: N/A;  . TEE WITHOUT CARDIOVERSION  08/17/2011   Procedure: TRANSESOPHAGEAL ECHOCARDIOGRAM (TEE);  Surgeon: Larey Dresser, MD;  Location: [facility] ENDOSCOPY;  Service: Cardiovascular;  Laterality: N/A;  . VIDEO ASSISTED THORACOSCOPY (VATS)/DECORTICATION  08/10/11    Family History  Problem Relation Age of Onset  . Hypertension Mother   . Varicose Veins Mother   . Diabetes Paternal Grandmother   . Cancer Paternal Grandfather   . CAD Paternal  Uncle     Social History   Tobacco Use  . Smoking status: Former Smoker    Packs/day: 0.50    Years: 32.00    Pack years: 16.00    Types: Cigarettes    Last attempt to quit: 10/13/2012    Years since quitting: 4.1  . Smokeless tobacco: Never Used  Substance Use Topics  . Alcohol use: No    Alcohol/week: 0.0 oz  . Drug use: No    Prior to Admission medications   Medication Sig Start Date End Date Taking? Authorizing Provider  albuterol (PROVENTIL HFA;VENTOLIN HFA) 108 (90 Base) MCG/ACT inhaler Inhale 1-2 puffs into the lungs every 6 (six) hours as needed for wheezing or shortness of breath. 05/02/16   Palumbo, April, MD    benzonatate (TESSALON) 100 MG capsule Take 1 capsule (100 mg total) by mouth every 8 (eight) hours. 05/02/16   Palumbo, April, MD  cetirizine (ZYRTEC ALLERGY) 10 MG tablet Take 1 tablet (10 mg total) by mouth daily. 05/02/16   Palumbo, April, MD  clonazePAM Bobbye [place]) 1 MG tablet 1 mg 01/27/10   [provider]  cloNIDine (CATAPRES-TTS-3) 0.3 mg/24hr patch WEEKLY 01/27/10   [provider]  Fluticasone-Salmeterol (ADVAIR) 250-50 MCG/DOSE AEPB Inhale 2 puffs into the lungs daily at 12 noon.    [provider]  FOSRENOL 1000 MG chewable tablet CHEW & SWALLOW 2 TABLETS BY MOUTH WITH MEALS 12/25/14   [provider]  glucose blood (ONE TOUCH ULTRA TEST) test strip  12/26/09   [provider]  labetalol (NORMODYNE) 300 MG tablet Take 300 mg by mouth 2 (two) times daily.     [provider]  SENSIPAR 30 MG tablet  01/11/15   [provider]  tadalafil (CIALIS) 5 MG tablet Take 5 mg by mouth daily as needed for erectile dysfunction.    [provider]  traMADol (ULTRAM) 50 MG tablet Take 1 tablet (50 mg total) by mouth every 6 (six) hours as needed. 02/05/14   Alvia Grove, PA-C    Allergies Codeine   REVIEW OF SYSTEMS  Negative except as noted here or in the History of Present Illness.   PHYSICAL EXAMINATION  Initial Vital Signs Blood pressure 122/80, pulse (!) 104, temperature 98.7 F (37.1 C), temperature source Oral, resp. rate 18, height 5\' 9"  (1.753 m), weight 81.6 kg (180 lb), SpO2 100 %.  Examination General: Well-developed, well-nourished male in no acute distress; appearance consistent with age of record HENT: normocephalic; atraumatic Eyes: pupils equal, round and reactive to light; extraocular muscles intact; arcus senilis bilaterally; conjunctival erythema bilaterally; serous exudate Neck: supple Heart: regular rate and rhythm; systolic murmur heard loudest at the left upper sternal border Lungs: clear to  auscultation bilaterally Abdomen: soft; nondistended; nontender; bowel sounds present Extremities: No deformity; full range of motion; dialysis graft left upper arm with pulse and thrill Neurologic: Awake, alert and oriented; motor function intact in all extremities and symmetric; no facial droop Skin: Warm and dry Psychiatric: Normal mood and affect   RESULTS  Summary of this visit's results, reviewed by myself:   EKG Interpretation  Date/Time:    Ventricular Rate:    PR Interval:    QRS Duration:   QT Interval:    QTC Calculation:   R Axis:     Text Interpretation:        Laboratory Studies: No results found for this or any previous visit (from the past 24 hour(s)). Imaging Studies: No results found.  ED COURSE  Nursing notes and initial vitals signs, including pulse oximetry, reviewed.  Vitals:   12/08/16 0023 12/08/16 0024  BP: 122/80   Pulse: (!) 104   Resp: 18   Temp: 98.7 F (37.1 C)   TempSrc: Oral   SpO2: 100%   Weight:  81.6 kg (180 lb)  Height:  5\' 9"  (1.753 m)    PROCEDURES    ED DIAGNOSES     ICD-10-CM   1. Acute atopic conjunctivitis of both eyes H10.13        Sesar Madewell, Jenny Reichmann, MD 12/08/16 [phone]

## 2016-12-11 DIAGNOSIS — N186 End stage renal disease: Secondary | ICD-10-CM | POA: Diagnosis not present

## 2016-12-11 DIAGNOSIS — N2581 Secondary hyperparathyroidism of renal origin: Secondary | ICD-10-CM | POA: Diagnosis not present

## 2016-12-13 DIAGNOSIS — N186 End stage renal disease: Secondary | ICD-10-CM | POA: Diagnosis not present

## 2016-12-13 DIAGNOSIS — N2581 Secondary hyperparathyroidism of renal origin: Secondary | ICD-10-CM | POA: Diagnosis not present

## 2016-12-15 DIAGNOSIS — N186 End stage renal disease: Secondary | ICD-10-CM | POA: Diagnosis not present

## 2016-12-15 DIAGNOSIS — N2581 Secondary hyperparathyroidism of renal origin: Secondary | ICD-10-CM | POA: Diagnosis not present

## 2016-12-18 DIAGNOSIS — N186 End stage renal disease: Secondary | ICD-10-CM | POA: Diagnosis not present

## 2016-12-18 DIAGNOSIS — N2581 Secondary hyperparathyroidism of renal origin: Secondary | ICD-10-CM | POA: Diagnosis not present

## 2016-12-20 DIAGNOSIS — N2581 Secondary hyperparathyroidism of renal origin: Secondary | ICD-10-CM | POA: Diagnosis not present

## 2016-12-20 DIAGNOSIS — N186 End stage renal disease: Secondary | ICD-10-CM | POA: Diagnosis not present

## 2016-12-22 DIAGNOSIS — N2581 Secondary hyperparathyroidism of renal origin: Secondary | ICD-10-CM | POA: Diagnosis not present

## 2016-12-22 DIAGNOSIS — N186 End stage renal disease: Secondary | ICD-10-CM | POA: Diagnosis not present

## 2016-12-25 DIAGNOSIS — N186 End stage renal disease: Secondary | ICD-10-CM | POA: Diagnosis not present

## 2016-12-25 DIAGNOSIS — N2581 Secondary hyperparathyroidism of renal origin: Secondary | ICD-10-CM | POA: Diagnosis not present

## 2016-12-27 DIAGNOSIS — N2581 Secondary hyperparathyroidism of renal origin: Secondary | ICD-10-CM | POA: Diagnosis not present

## 2016-12-27 DIAGNOSIS — N186 End stage renal disease: Secondary | ICD-10-CM | POA: Diagnosis not present

## 2016-12-29 DIAGNOSIS — N186 End stage renal disease: Secondary | ICD-10-CM | POA: Diagnosis not present

## 2016-12-29 DIAGNOSIS — N2581 Secondary hyperparathyroidism of renal origin: Secondary | ICD-10-CM | POA: Diagnosis not present

## 2016-12-31 DIAGNOSIS — Z992 Dependence on renal dialysis: Secondary | ICD-10-CM | POA: Diagnosis not present

## 2016-12-31 DIAGNOSIS — T8610 Unspecified complication of kidney transplant: Secondary | ICD-10-CM | POA: Diagnosis not present

## 2016-12-31 DIAGNOSIS — N186 End stage renal disease: Secondary | ICD-10-CM | POA: Diagnosis not present

## 2017-01-03 DIAGNOSIS — N186 End stage renal disease: Secondary | ICD-10-CM | POA: Diagnosis not present

## 2017-01-05 DIAGNOSIS — N186 End stage renal disease: Secondary | ICD-10-CM | POA: Diagnosis not present

## 2017-01-07 DIAGNOSIS — N2581 Secondary hyperparathyroidism of renal origin: Secondary | ICD-10-CM | POA: Diagnosis not present

## 2017-01-07 DIAGNOSIS — Z992 Dependence on renal dialysis: Secondary | ICD-10-CM | POA: Diagnosis not present

## 2017-01-07 DIAGNOSIS — T8612 Kidney transplant failure: Secondary | ICD-10-CM | POA: Diagnosis not present

## 2017-01-07 DIAGNOSIS — N186 End stage renal disease: Secondary | ICD-10-CM | POA: Diagnosis not present

## 2017-01-08 DIAGNOSIS — N186 End stage renal disease: Secondary | ICD-10-CM | POA: Diagnosis not present

## 2017-01-08 DIAGNOSIS — T8610 Unspecified complication of kidney transplant: Secondary | ICD-10-CM | POA: Diagnosis not present

## 2017-01-08 DIAGNOSIS — Z992 Dependence on renal dialysis: Secondary | ICD-10-CM | POA: Diagnosis not present

## 2017-01-10 DIAGNOSIS — D631 Anemia in chronic kidney disease: Secondary | ICD-10-CM | POA: Diagnosis not present

## 2017-01-10 DIAGNOSIS — E119 Type 2 diabetes mellitus without complications: Secondary | ICD-10-CM | POA: Diagnosis not present

## 2017-01-10 DIAGNOSIS — N186 End stage renal disease: Secondary | ICD-10-CM | POA: Diagnosis not present

## 2017-01-10 DIAGNOSIS — N2581 Secondary hyperparathyroidism of renal origin: Secondary | ICD-10-CM | POA: Diagnosis not present

## 2017-01-12 DIAGNOSIS — N186 End stage renal disease: Secondary | ICD-10-CM | POA: Diagnosis not present

## 2017-01-12 DIAGNOSIS — N2581 Secondary hyperparathyroidism of renal origin: Secondary | ICD-10-CM | POA: Diagnosis not present

## 2017-01-12 DIAGNOSIS — D631 Anemia in chronic kidney disease: Secondary | ICD-10-CM | POA: Diagnosis not present

## 2017-01-12 DIAGNOSIS — E119 Type 2 diabetes mellitus without complications: Secondary | ICD-10-CM | POA: Diagnosis not present

## 2017-01-15 DIAGNOSIS — E119 Type 2 diabetes mellitus without complications: Secondary | ICD-10-CM | POA: Diagnosis not present

## 2017-01-15 DIAGNOSIS — N2581 Secondary hyperparathyroidism of renal origin: Secondary | ICD-10-CM | POA: Diagnosis not present

## 2017-01-15 DIAGNOSIS — D631 Anemia in chronic kidney disease: Secondary | ICD-10-CM | POA: Diagnosis not present

## 2017-01-15 DIAGNOSIS — N186 End stage renal disease: Secondary | ICD-10-CM | POA: Diagnosis not present

## 2017-01-17 DIAGNOSIS — E119 Type 2 diabetes mellitus without complications: Secondary | ICD-10-CM | POA: Diagnosis not present

## 2017-01-17 DIAGNOSIS — N186 End stage renal disease: Secondary | ICD-10-CM | POA: Diagnosis not present

## 2017-01-17 DIAGNOSIS — N2581 Secondary hyperparathyroidism of renal origin: Secondary | ICD-10-CM | POA: Diagnosis not present

## 2017-01-17 DIAGNOSIS — D631 Anemia in chronic kidney disease: Secondary | ICD-10-CM | POA: Diagnosis not present

## 2017-01-19 DIAGNOSIS — D631 Anemia in chronic kidney disease: Secondary | ICD-10-CM | POA: Diagnosis not present

## 2017-01-19 DIAGNOSIS — N186 End stage renal disease: Secondary | ICD-10-CM | POA: Diagnosis not present

## 2017-01-19 DIAGNOSIS — E119 Type 2 diabetes mellitus without complications: Secondary | ICD-10-CM | POA: Diagnosis not present

## 2017-01-19 DIAGNOSIS — N2581 Secondary hyperparathyroidism of renal origin: Secondary | ICD-10-CM | POA: Diagnosis not present

## 2017-01-22 DIAGNOSIS — N2581 Secondary hyperparathyroidism of renal origin: Secondary | ICD-10-CM | POA: Diagnosis not present

## 2017-01-22 DIAGNOSIS — E119 Type 2 diabetes mellitus without complications: Secondary | ICD-10-CM | POA: Diagnosis not present

## 2017-01-22 DIAGNOSIS — D631 Anemia in chronic kidney disease: Secondary | ICD-10-CM | POA: Diagnosis not present

## 2017-01-22 DIAGNOSIS — N186 End stage renal disease: Secondary | ICD-10-CM | POA: Diagnosis not present

## 2017-01-24 DIAGNOSIS — D631 Anemia in chronic kidney disease: Secondary | ICD-10-CM | POA: Diagnosis not present

## 2017-01-24 DIAGNOSIS — E119 Type 2 diabetes mellitus without complications: Secondary | ICD-10-CM | POA: Diagnosis not present

## 2017-01-24 DIAGNOSIS — N186 End stage renal disease: Secondary | ICD-10-CM | POA: Diagnosis not present

## 2017-01-24 DIAGNOSIS — N2581 Secondary hyperparathyroidism of renal origin: Secondary | ICD-10-CM | POA: Diagnosis not present

## 2017-01-26 DIAGNOSIS — E119 Type 2 diabetes mellitus without complications: Secondary | ICD-10-CM | POA: Diagnosis not present

## 2017-01-26 DIAGNOSIS — N186 End stage renal disease: Secondary | ICD-10-CM | POA: Diagnosis not present

## 2017-01-26 DIAGNOSIS — N2581 Secondary hyperparathyroidism of renal origin: Secondary | ICD-10-CM | POA: Diagnosis not present

## 2017-01-26 DIAGNOSIS — D631 Anemia in chronic kidney disease: Secondary | ICD-10-CM | POA: Diagnosis not present

## 2017-01-29 DIAGNOSIS — N186 End stage renal disease: Secondary | ICD-10-CM | POA: Diagnosis not present

## 2017-01-29 DIAGNOSIS — E119 Type 2 diabetes mellitus without complications: Secondary | ICD-10-CM | POA: Diagnosis not present

## 2017-01-29 DIAGNOSIS — N2581 Secondary hyperparathyroidism of renal origin: Secondary | ICD-10-CM | POA: Diagnosis not present

## 2017-01-29 DIAGNOSIS — D631 Anemia in chronic kidney disease: Secondary | ICD-10-CM | POA: Diagnosis not present

## 2017-01-31 DIAGNOSIS — N186 End stage renal disease: Secondary | ICD-10-CM | POA: Diagnosis not present

## 2017-01-31 DIAGNOSIS — N2581 Secondary hyperparathyroidism of renal origin: Secondary | ICD-10-CM | POA: Diagnosis not present

## 2017-01-31 DIAGNOSIS — E119 Type 2 diabetes mellitus without complications: Secondary | ICD-10-CM | POA: Diagnosis not present

## 2017-01-31 DIAGNOSIS — D631 Anemia in chronic kidney disease: Secondary | ICD-10-CM | POA: Diagnosis not present

## 2017-02-02 DIAGNOSIS — D631 Anemia in chronic kidney disease: Secondary | ICD-10-CM | POA: Diagnosis not present

## 2017-02-02 DIAGNOSIS — N186 End stage renal disease: Secondary | ICD-10-CM | POA: Diagnosis not present

## 2017-02-02 DIAGNOSIS — E119 Type 2 diabetes mellitus without complications: Secondary | ICD-10-CM | POA: Diagnosis not present

## 2017-02-02 DIAGNOSIS — N2581 Secondary hyperparathyroidism of renal origin: Secondary | ICD-10-CM | POA: Diagnosis not present

## 2017-02-05 DIAGNOSIS — N2581 Secondary hyperparathyroidism of renal origin: Secondary | ICD-10-CM | POA: Diagnosis not present

## 2017-02-05 DIAGNOSIS — N186 End stage renal disease: Secondary | ICD-10-CM | POA: Diagnosis not present

## 2017-02-05 DIAGNOSIS — D631 Anemia in chronic kidney disease: Secondary | ICD-10-CM | POA: Diagnosis not present

## 2017-02-05 DIAGNOSIS — E119 Type 2 diabetes mellitus without complications: Secondary | ICD-10-CM | POA: Diagnosis not present

## 2017-02-07 DIAGNOSIS — Z992 Dependence on renal dialysis: Secondary | ICD-10-CM | POA: Diagnosis not present

## 2017-02-07 DIAGNOSIS — D631 Anemia in chronic kidney disease: Secondary | ICD-10-CM | POA: Diagnosis not present

## 2017-02-07 DIAGNOSIS — T8612 Kidney transplant failure: Secondary | ICD-10-CM | POA: Diagnosis not present

## 2017-02-07 DIAGNOSIS — N2581 Secondary hyperparathyroidism of renal origin: Secondary | ICD-10-CM | POA: Diagnosis not present

## 2017-02-07 DIAGNOSIS — E119 Type 2 diabetes mellitus without complications: Secondary | ICD-10-CM | POA: Diagnosis not present

## 2017-02-07 DIAGNOSIS — N186 End stage renal disease: Secondary | ICD-10-CM | POA: Diagnosis not present

## 2017-02-08 DIAGNOSIS — N186 End stage renal disease: Secondary | ICD-10-CM | POA: Diagnosis not present

## 2017-02-08 DIAGNOSIS — T8612 Kidney transplant failure: Secondary | ICD-10-CM | POA: Diagnosis not present

## 2017-02-08 DIAGNOSIS — Z992 Dependence on renal dialysis: Secondary | ICD-10-CM | POA: Diagnosis not present

## 2017-02-09 DIAGNOSIS — N186 End stage renal disease: Secondary | ICD-10-CM | POA: Diagnosis not present

## 2017-02-09 DIAGNOSIS — E119 Type 2 diabetes mellitus without complications: Secondary | ICD-10-CM | POA: Diagnosis not present

## 2017-02-09 DIAGNOSIS — N2581 Secondary hyperparathyroidism of renal origin: Secondary | ICD-10-CM | POA: Diagnosis not present

## 2017-02-09 DIAGNOSIS — D509 Iron deficiency anemia, unspecified: Secondary | ICD-10-CM | POA: Diagnosis not present

## 2017-02-12 DIAGNOSIS — N186 End stage renal disease: Secondary | ICD-10-CM | POA: Diagnosis not present

## 2017-02-12 DIAGNOSIS — N2581 Secondary hyperparathyroidism of renal origin: Secondary | ICD-10-CM | POA: Diagnosis not present

## 2017-02-12 DIAGNOSIS — D509 Iron deficiency anemia, unspecified: Secondary | ICD-10-CM | POA: Diagnosis not present

## 2017-02-12 DIAGNOSIS — E119 Type 2 diabetes mellitus without complications: Secondary | ICD-10-CM | POA: Diagnosis not present

## 2017-02-14 DIAGNOSIS — N186 End stage renal disease: Secondary | ICD-10-CM | POA: Diagnosis not present

## 2017-02-14 DIAGNOSIS — E119 Type 2 diabetes mellitus without complications: Secondary | ICD-10-CM | POA: Diagnosis not present

## 2017-02-14 DIAGNOSIS — N2581 Secondary hyperparathyroidism of renal origin: Secondary | ICD-10-CM | POA: Diagnosis not present

## 2017-02-14 DIAGNOSIS — D509 Iron deficiency anemia, unspecified: Secondary | ICD-10-CM | POA: Diagnosis not present

## 2017-02-16 DIAGNOSIS — E119 Type 2 diabetes mellitus without complications: Secondary | ICD-10-CM | POA: Diagnosis not present

## 2017-02-16 DIAGNOSIS — N2581 Secondary hyperparathyroidism of renal origin: Secondary | ICD-10-CM | POA: Diagnosis not present

## 2017-02-16 DIAGNOSIS — N186 End stage renal disease: Secondary | ICD-10-CM | POA: Diagnosis not present

## 2017-02-16 DIAGNOSIS — D509 Iron deficiency anemia, unspecified: Secondary | ICD-10-CM | POA: Diagnosis not present

## 2017-02-19 DIAGNOSIS — N186 End stage renal disease: Secondary | ICD-10-CM | POA: Diagnosis not present

## 2017-02-19 DIAGNOSIS — E119 Type 2 diabetes mellitus without complications: Secondary | ICD-10-CM | POA: Diagnosis not present

## 2017-02-19 DIAGNOSIS — D509 Iron deficiency anemia, unspecified: Secondary | ICD-10-CM | POA: Diagnosis not present

## 2017-02-19 DIAGNOSIS — N2581 Secondary hyperparathyroidism of renal origin: Secondary | ICD-10-CM | POA: Diagnosis not present

## 2017-02-21 DIAGNOSIS — N186 End stage renal disease: Secondary | ICD-10-CM | POA: Diagnosis not present

## 2017-02-21 DIAGNOSIS — E119 Type 2 diabetes mellitus without complications: Secondary | ICD-10-CM | POA: Diagnosis not present

## 2017-02-21 DIAGNOSIS — N2581 Secondary hyperparathyroidism of renal origin: Secondary | ICD-10-CM | POA: Diagnosis not present

## 2017-02-21 DIAGNOSIS — D509 Iron deficiency anemia, unspecified: Secondary | ICD-10-CM | POA: Diagnosis not present

## 2017-02-23 DIAGNOSIS — N186 End stage renal disease: Secondary | ICD-10-CM | POA: Diagnosis not present

## 2017-02-23 DIAGNOSIS — N2581 Secondary hyperparathyroidism of renal origin: Secondary | ICD-10-CM | POA: Diagnosis not present

## 2017-02-23 DIAGNOSIS — E119 Type 2 diabetes mellitus without complications: Secondary | ICD-10-CM | POA: Diagnosis not present

## 2017-02-23 DIAGNOSIS — D509 Iron deficiency anemia, unspecified: Secondary | ICD-10-CM | POA: Diagnosis not present

## 2017-02-26 DIAGNOSIS — D509 Iron deficiency anemia, unspecified: Secondary | ICD-10-CM | POA: Diagnosis not present

## 2017-02-26 DIAGNOSIS — N2581 Secondary hyperparathyroidism of renal origin: Secondary | ICD-10-CM | POA: Diagnosis not present

## 2017-02-26 DIAGNOSIS — N186 End stage renal disease: Secondary | ICD-10-CM | POA: Diagnosis not present

## 2017-02-26 DIAGNOSIS — E119 Type 2 diabetes mellitus without complications: Secondary | ICD-10-CM | POA: Diagnosis not present

## 2017-02-28 DIAGNOSIS — E119 Type 2 diabetes mellitus without complications: Secondary | ICD-10-CM | POA: Diagnosis not present

## 2017-02-28 DIAGNOSIS — N2581 Secondary hyperparathyroidism of renal origin: Secondary | ICD-10-CM | POA: Diagnosis not present

## 2017-02-28 DIAGNOSIS — N186 End stage renal disease: Secondary | ICD-10-CM | POA: Diagnosis not present

## 2017-02-28 DIAGNOSIS — D509 Iron deficiency anemia, unspecified: Secondary | ICD-10-CM | POA: Diagnosis not present

## 2017-03-02 DIAGNOSIS — E119 Type 2 diabetes mellitus without complications: Secondary | ICD-10-CM | POA: Diagnosis not present

## 2017-03-02 DIAGNOSIS — N2581 Secondary hyperparathyroidism of renal origin: Secondary | ICD-10-CM | POA: Diagnosis not present

## 2017-03-02 DIAGNOSIS — N186 End stage renal disease: Secondary | ICD-10-CM | POA: Diagnosis not present

## 2017-03-02 DIAGNOSIS — D509 Iron deficiency anemia, unspecified: Secondary | ICD-10-CM | POA: Diagnosis not present

## 2017-03-05 DIAGNOSIS — N2581 Secondary hyperparathyroidism of renal origin: Secondary | ICD-10-CM | POA: Diagnosis not present

## 2017-03-05 DIAGNOSIS — N186 End stage renal disease: Secondary | ICD-10-CM | POA: Diagnosis not present

## 2017-03-05 DIAGNOSIS — E119 Type 2 diabetes mellitus without complications: Secondary | ICD-10-CM | POA: Diagnosis not present

## 2017-03-05 DIAGNOSIS — D509 Iron deficiency anemia, unspecified: Secondary | ICD-10-CM | POA: Diagnosis not present

## 2017-03-07 DIAGNOSIS — N186 End stage renal disease: Secondary | ICD-10-CM | POA: Diagnosis not present

## 2017-03-07 DIAGNOSIS — E119 Type 2 diabetes mellitus without complications: Secondary | ICD-10-CM | POA: Diagnosis not present

## 2017-03-07 DIAGNOSIS — N2581 Secondary hyperparathyroidism of renal origin: Secondary | ICD-10-CM | POA: Diagnosis not present

## 2017-03-07 DIAGNOSIS — D509 Iron deficiency anemia, unspecified: Secondary | ICD-10-CM | POA: Diagnosis not present

## 2017-03-09 DIAGNOSIS — D509 Iron deficiency anemia, unspecified: Secondary | ICD-10-CM | POA: Diagnosis not present

## 2017-03-09 DIAGNOSIS — E119 Type 2 diabetes mellitus without complications: Secondary | ICD-10-CM | POA: Diagnosis not present

## 2017-03-09 DIAGNOSIS — N2581 Secondary hyperparathyroidism of renal origin: Secondary | ICD-10-CM | POA: Diagnosis not present

## 2017-03-09 DIAGNOSIS — N186 End stage renal disease: Secondary | ICD-10-CM | POA: Diagnosis not present

## 2017-03-12 DIAGNOSIS — N2581 Secondary hyperparathyroidism of renal origin: Secondary | ICD-10-CM | POA: Diagnosis not present

## 2017-03-12 DIAGNOSIS — N186 End stage renal disease: Secondary | ICD-10-CM | POA: Diagnosis not present

## 2017-03-12 DIAGNOSIS — E119 Type 2 diabetes mellitus without complications: Secondary | ICD-10-CM | POA: Diagnosis not present

## 2017-03-12 DIAGNOSIS — D509 Iron deficiency anemia, unspecified: Secondary | ICD-10-CM | POA: Diagnosis not present

## 2017-03-14 DIAGNOSIS — E119 Type 2 diabetes mellitus without complications: Secondary | ICD-10-CM | POA: Diagnosis not present

## 2017-03-14 DIAGNOSIS — D509 Iron deficiency anemia, unspecified: Secondary | ICD-10-CM | POA: Diagnosis not present

## 2017-03-14 DIAGNOSIS — N186 End stage renal disease: Secondary | ICD-10-CM | POA: Diagnosis not present

## 2017-03-14 DIAGNOSIS — N2581 Secondary hyperparathyroidism of renal origin: Secondary | ICD-10-CM | POA: Diagnosis not present

## 2017-03-16 DIAGNOSIS — D509 Iron deficiency anemia, unspecified: Secondary | ICD-10-CM | POA: Diagnosis not present

## 2017-03-16 DIAGNOSIS — N2581 Secondary hyperparathyroidism of renal origin: Secondary | ICD-10-CM | POA: Diagnosis not present

## 2017-03-16 DIAGNOSIS — E119 Type 2 diabetes mellitus without complications: Secondary | ICD-10-CM | POA: Diagnosis not present

## 2017-03-16 DIAGNOSIS — N186 End stage renal disease: Secondary | ICD-10-CM | POA: Diagnosis not present

## 2017-03-17 ENCOUNTER — Other Ambulatory Visit: Payer: Self-pay

## 2017-03-17 ENCOUNTER — Emergency Department (HOSPITAL_BASED_OUTPATIENT_CLINIC_OR_DEPARTMENT_OTHER)
Admission: EM | Admit: 2017-03-17 | Discharge: 2017-03-17 | Disposition: A | Discharge status: Home / Self Care | Payer: Medicare Other | Attending: Emergency Medicine | Admitting: Emergency Medicine

## 2017-03-17 ENCOUNTER — Emergency Department (HOSPITAL_BASED_OUTPATIENT_CLINIC_OR_DEPARTMENT_OTHER): Payer: Medicare Other

## 2017-03-17 ENCOUNTER — Encounter (HOSPITAL_BASED_OUTPATIENT_CLINIC_OR_DEPARTMENT_OTHER): Payer: Self-pay | Admitting: Emergency Medicine

## 2017-03-17 DIAGNOSIS — E1122 Type 2 diabetes mellitus with diabetic chronic kidney disease: Secondary | ICD-10-CM | POA: Diagnosis not present

## 2017-03-17 DIAGNOSIS — Z8673 Personal history of transient ischemic attack (TIA), and cerebral infarction without residual deficits: Secondary | ICD-10-CM | POA: Diagnosis not present

## 2017-03-17 DIAGNOSIS — R05 Cough: Secondary | ICD-10-CM | POA: Diagnosis not present

## 2017-03-17 DIAGNOSIS — J40 Bronchitis, not specified as acute or chronic: Secondary | ICD-10-CM | POA: Diagnosis not present

## 2017-03-17 DIAGNOSIS — Z79899 Other long term (current) drug therapy: Secondary | ICD-10-CM | POA: Diagnosis not present

## 2017-03-17 DIAGNOSIS — I5020 Unspecified systolic (congestive) heart failure: Secondary | ICD-10-CM | POA: Diagnosis not present

## 2017-03-17 DIAGNOSIS — Z940 Kidney transplant status: Secondary | ICD-10-CM | POA: Diagnosis not present

## 2017-03-17 DIAGNOSIS — F419 Anxiety disorder, unspecified: Secondary | ICD-10-CM | POA: Insufficient documentation

## 2017-03-17 DIAGNOSIS — Z992 Dependence on renal dialysis: Secondary | ICD-10-CM | POA: Insufficient documentation

## 2017-03-17 DIAGNOSIS — N186 End stage renal disease: Secondary | ICD-10-CM | POA: Diagnosis not present

## 2017-03-17 DIAGNOSIS — J449 Chronic obstructive pulmonary disease, unspecified: Secondary | ICD-10-CM | POA: Diagnosis not present

## 2017-03-17 DIAGNOSIS — R062 Wheezing: Secondary | ICD-10-CM | POA: Diagnosis not present

## 2017-03-17 DIAGNOSIS — I132 Hypertensive heart and chronic kidney disease with heart failure and with stage 5 chronic kidney disease, or end stage renal disease: Secondary | ICD-10-CM | POA: Insufficient documentation

## 2017-03-17 DIAGNOSIS — Z87891 Personal history of nicotine dependence: Secondary | ICD-10-CM | POA: Diagnosis not present

## 2017-03-17 DIAGNOSIS — R0602 Shortness of breath: Secondary | ICD-10-CM | POA: Diagnosis not present

## 2017-03-17 MED ORDER — ALBUTEROL SULFATE (2.5 MG/3ML) 0.083% IN NEBU
5.0000 mg | INHALATION_SOLUTION | Freq: Once | RESPIRATORY_TRACT | Status: AC
  Administered 2017-03-17: 5 mg via RESPIRATORY_TRACT
  Filled 2017-03-17: qty 6

## 2017-03-17 MED ORDER — ALBUTEROL SULFATE HFA 108 (90 BASE) MCG/ACT IN AERS
2.0000 | INHALATION_SPRAY | RESPIRATORY_TRACT | Status: DC | PRN
  Administered 2017-03-17: 2 via RESPIRATORY_TRACT
  Filled 2017-03-17: qty 6.7

## 2017-03-17 MED ORDER — IPRATROPIUM BROMIDE 0.02 % IN SOLN: 0.5000 mg | Freq: Once | RESPIRATORY_TRACT | Status: DC

## 2017-03-17 MED ORDER — AZITHROMYCIN 250 MG PO TABS: 250.0000 mg | ORAL_TABLET | Freq: Every day | ORAL | 0 refills | Status: DC

## 2017-03-17 MED ORDER — PREDNISONE 20 MG PO TABS: 40.0000 mg | ORAL_TABLET | Freq: Every day | ORAL | 0 refills | Status: DC

## 2017-03-17 MED ORDER — IPRATROPIUM-ALBUTEROL 0.5-2.5 (3) MG/3ML IN SOLN
3.0000 mL | Freq: Once | RESPIRATORY_TRACT | Status: AC
  Administered 2017-03-17: 3 mL via RESPIRATORY_TRACT
  Filled 2017-03-17: qty 3

## 2017-03-17 MED ORDER — HYDROCODONE-HOMATROPINE 5-1.5 MG/5ML PO SYRP: 5.0000 mL | ORAL_SOLUTION | Freq: Every evening | ORAL | 0 refills | Status: DC | PRN

## 2017-03-17 NOTE — ED Provider Notes (Signed)
[facility] EMERGENCY DEPARTMENT Provider Note   CSN: [id number] Arrival date & time: 03/17/17  0840     History   Chief Complaint Chief Complaint  Patient presents with  . Cough    HPI Nathaniel White is a 57 y.o. male.  Patient is a 57 year old male with a history of CHF, end-stage renal disease on dialysis, COPD, borderline diabetes, stroke presenting today with 3 weeks of cough and congestion.  Patient states he is not able to get any sleep because he is coughing all night long.  All symptoms seem to be worse at night.  He does note some mild shortness of breath and wheezing at night.  He last had dialysis yesterday and had a full course.  He was at his dry weight before discharge.  He denies any leg swelling, chest pain, abdominal pain, fever, nausea or vomiting.  He does have an inhaler at home but is only used it 1 or 2 times in the last few weeks and thinks it may have helped just slightly.  He has not smoked cigarettes for the last 5-6 years.   The history is provided by the patient.  Cough  This is a new problem. Episode onset: 3 weeks ago. The problem occurs every few minutes. The problem has not changed since onset.The cough is productive of sputum. There has been no fever. Associated symptoms include shortness of breath and wheezing. Pertinent negatives include no chest pain, no chills, no sweats, no rhinorrhea and no sore throat. Treatments tried: tried inhaler 1 or 2 times over the last few weeks and may have helped a little. The treatment provided no relief. He is not a smoker. His past medical history is significant for COPD. Past medical history comments: ESRD on dialysis (last dialzed yesterday).    Past Medical History:  Diagnosis Date  . Anemia   . Anxiety   . Asthma   . CHF (congestive heart failure) ([place])   . Complication of anesthesia    according to pt and spouse pt was moving around and cough while under and pt had difficulty waking up so the  anesthesia had to be reversed. and pt admitted to ICU.   Marland Kitchen COPD (chronic obstructive pulmonary disease) ([place])   . Diabetes mellitus without complication ([place])    not on medications  . ESRD    on HD, M- W- F   . Hemiparesis due to old stroke ([place])    left  . Hypertension   . LV dysfunction    EF 25-30% by echo 07/2011  . Memory loss due to medical condition    due to stroke  . MR (mitral regurgitation)    moderate to severe, echo 07/2011  . Shortness of breath   . Stroke [facility])    TIA's-left sided weakness  . Tobacco abuse     Patient Active Problem List   Diagnosis Date Noted  . Thrombocytopenia ([place]) 01/17/2015  . Transfusion history 01/17/2015  . Hemodialysis AV fistula aneurysm ([place]) 11/10/2013  . Complication from renal dialysis device 11/02/2013  . Hyperkalemia 10/06/2013  . Cough 09/07/2013  . CHF, systolic dysfunction 83/35/8251  . Other complications due to renal dialysis device, implant, and graft 10/27/2012  . Cerebral embolism with cerebral infarction ([place]) 08/13/2011  . Shortness of breath 03/13/2011  . ESRD (end stage renal disease) on dialysis ([place]) 03/13/2011  . HTN (hypertension) 03/13/2011  . H/O: CVA (cardiovascular accident) 03/13/2011  . Pleural effusion, right chronic 03/13/2011  .  Anemia 03/13/2011  . Fluid overload 03/13/2011  . History of nonadherence to medical treatment 03/13/2011  . DM (diabetes mellitus) ([place]) 03/13/2011  . Tobacco abuse 03/13/2011  . Essential hypertension, benign 10/04/2008  . ESOPHAGEAL VARICES 10/04/2008  . HEMATOCHEZIA 10/04/2008  . End stage renal disease ([place]) 10/04/2008    Past Surgical History:  Procedure Laterality Date  . AV FISTULA PLACEMENT    . CARDIAC CATHETERIZATION     [place] [facility]  . COLONOSCOPY W/ BIOPSIES AND POLYPECTOMY    . FISTULA SUPERFICIALIZATION Left 11/10/2013   Procedure: FISTULA PLICATION;  Surgeon: Serafina Mitchell, MD;  Location: [place];  Service: Vascular;  Laterality: Left;  . KIDNEY  TRANSPLANT     2011 rejected kidney 2012 back on dialysis  . LEFT AND RIGHT HEART CATHETERIZATION WITH CORONARY ANGIOGRAM N/A 10/09/2013   Procedure: LEFT AND RIGHT HEART CATHETERIZATION WITH CORONARY ANGIOGRAM;  Surgeon: Burnell Blanks, MD;  Location: [facility] CATH LAB;  Service: Cardiovascular;  Laterality: N/A;  . REVISON OF ARTERIOVENOUS FISTULA Left 08/26/2013   Procedure: EXCISION OF ERODED SKIN AND EXPLORATION OF MAIN LEFT UPPER ARM AV FISTULA;  Surgeon: Rosetta Posner, MD;  Location: [place];  Service: Vascular;  Laterality: Left;  . REVISON OF ARTERIOVENOUS FISTULA Left 02/05/2014   Procedure: REPAIR OF ARTERIOVENOUS FISTULA ANEURYSM;  Surgeon: Serafina Mitchell, MD;  Location: [facility];  Service: Vascular;  Laterality: Left;  . SHUNTOGRAM N/A 11/05/2012   Procedure: Fistulogram;  Surgeon: Serafina Mitchell, MD;  Location: [facility] CATH LAB;  Service: Cardiovascular;  Laterality: N/A;  . TEE WITHOUT CARDIOVERSION  08/17/2011   Procedure: TRANSESOPHAGEAL ECHOCARDIOGRAM (TEE);  Surgeon: Larey Dresser, MD;  Location: [place];  Service: Cardiovascular;  Laterality: N/A;  . VIDEO ASSISTED THORACOSCOPY (VATS)/DECORTICATION  08/10/11       Home Medications    Prior to Admission medications   Medication Sig Start Date End Date Taking? Authorizing Provider  albuterol (PROVENTIL HFA;VENTOLIN HFA) 108 (90 Base) MCG/ACT inhaler Inhale 1-2 puffs into the lungs every 6 (six) hours as needed for wheezing or shortness of breath. 05/02/16   Palumbo, April, MD  benzonatate (TESSALON) 100 MG capsule Take 1 capsule (100 mg total) by mouth every 8 (eight) hours. 05/02/16   Palumbo, April, MD  cetirizine (ZYRTEC ALLERGY) 10 MG tablet Take 1 tablet (10 mg total) by mouth daily. 05/02/16   Palumbo, April, MD  clonazePAM Bobbye [place]) 1 MG tablet 1 mg 01/27/10   [provider]  cloNIDine (CATAPRES-TTS-3) 0.3 mg/24hr patch WEEKLY 01/27/10   [provider]  Fluticasone-Salmeterol (ADVAIR) 250-50 MCG/DOSE AEPB  Inhale 2 puffs into the lungs daily at 12 noon.    [provider]  FOSRENOL 1000 MG chewable tablet CHEW & SWALLOW 2 TABLETS BY MOUTH WITH MEALS 12/25/14   [provider]  glucose blood (ONE TOUCH ULTRA TEST) test strip  12/26/09   [provider]  labetalol (NORMODYNE) 300 MG tablet Take 300 mg by mouth 2 (two) times daily.     [provider]  SENSIPAR 30 MG tablet  01/11/15   [provider]  tadalafil (CIALIS) 5 MG tablet Take 5 mg by mouth daily as needed for erectile dysfunction.    [provider]  traMADol (ULTRAM) 50 MG tablet Take 1 tablet (50 mg total) by mouth every 6 (six) hours as needed. 02/05/14   Alvia Grove, PA-C    Family History Family History  Problem Relation Age of Onset  . Hypertension Mother   .  Varicose Veins Mother   . Diabetes Paternal Grandmother   . Cancer Paternal Grandfather   . CAD Paternal Uncle     Social History Social History   Tobacco Use  . Smoking status: Former Smoker    Packs/day: 0.50    Years: 32.00    Pack years: 16.00    Types: Cigarettes    Last attempt to quit: 10/13/2012    Years since quitting: 4.4  . Smokeless tobacco: Never Used  Substance Use Topics  . Alcohol use: No    Alcohol/week: 0.0 oz  . Drug use: No     Allergies   Codeine   Review of Systems Review of Systems  Constitutional: Negative for chills.  HENT: Negative for rhinorrhea and sore throat.   Respiratory: Positive for cough, shortness of breath and wheezing.   Cardiovascular: Negative for chest pain.  All other systems reviewed and are negative.    Physical Exam Updated Vital Signs BP (!) 133/93 (BP Location: Right Arm)   Pulse 94   Temp 97.8 F (36.6 C) (Oral)   Resp (!) 24   Ht 5\' 9"  (1.753 m)   Wt 81.6 kg (180 lb)   SpO2 95%   BMI 26.58 kg/m   Physical Exam  Constitutional: He is oriented to person, place, and time. He appears well-developed and well-nourished. No distress.    HENT:  Head: Normocephalic and atraumatic.  Mouth/Throat: Oropharynx is clear and moist.  Eyes: Conjunctivae and EOM are normal. Pupils are equal, round, and reactive to light.  Neck: Normal range of motion. Neck supple.  Cardiovascular: Normal rate, regular rhythm and intact distal pulses.  No murmur heard. Pulmonary/Chest: Effort normal. No respiratory distress. He has wheezes. He has no rales.  Scant wheezing in the bases bilaterally.  Frequent tight cough  Abdominal: Soft. He exhibits no distension. There is no tenderness. There is no rebound and no guarding.  Musculoskeletal: Normal range of motion. He exhibits no edema or tenderness.  Graft present in the left upper arm  Neurological: He is alert and oriented to person, place, and time.  Skin: Skin is warm and dry. No rash noted. No erythema.  Psychiatric: He has a normal mood and affect. His behavior is normal.  Nursing note and vitals reviewed.    ED Treatments / Results  Labs (all labs ordered are listed, but only abnormal results are displayed) Labs Reviewed - No data to display  EKG  EKG Interpretation None       Radiology Dg Chest 2 View  Result Date: 03/17/2017 CLINICAL DATA:  Productive cough for 3 weeks. History of CHF, COPD, asthma, TIA. EXAM: CHEST - 2 VIEW COMPARISON:  Chest x-rays dated 05/02/2016 and 11/15/2013 FINDINGS: Stable cardiomegaly.  Aortic atherosclerosis. Chronic scarring/fibrosis and pleural thickening at the right lung base is stable. Coarse lung markings again noted bilaterally compatible with chronic interstitial lung disease. No pleural effusion or pneumothorax seen. IMPRESSION: 1. No active cardiopulmonary disease. No evidence of pneumonia or pulmonary edema. 2. Stable cardiomegaly. 3. Probable chronic interstitial lung disease. 4. Chronic scarring/fibrosis and pleural thickening at the right lung base, stable. 5. Aortic atherosclerosis. Electronically Signed   By: Franki Cabot M.D.   On:  03/17/2017 09:45    Procedures Procedures (including critical care time)  Medications Ordered in ED Medications  ipratropium-albuterol (DUONEB) 0.5-2.5 (3) MG/3ML nebulizer solution 3 mL (not administered)  albuterol (PROVENTIL) (2.5 MG/3ML) 0.083% nebulizer solution 5 mg (not administered)  ipratropium (ATROVENT) nebulizer solution 0.5 mg (not  administered)     Initial Impression / Assessment and Plan / ED Course  I have reviewed the triage vital signs and the nursing notes.  Pertinent labs & imaging results that were available during my care of the patient were reviewed by me and considered in my medical decision making (see chart for details).     Patient with multiple medical problems presenting with symptoms most consistent with a COPD exacerbation vs bronchitis.  He denies any fever or symptoms concerning for pneumonia, he does not have any evidence of fluid overload on exam today and had a full course of dialysis yesterday.  Vital signs are reassuring other than some mild tachypnea with a respiratory rate of 24. Chest x-ray pending.  Patient given albuterol and Atrovent.  9:56 AM Test x-ray without significant findings.  After breathing treatment patient's wheezing has resolved.  He is still having fairly regular cough.  Will treat for bronchitis with azithromycin, prednisone and patient given an inhaler.  He was also given a cough suppressant at night. Final Clinical Impressions(s) / ED Diagnoses   Final diagnoses:  Bronchitis    ED Discharge Orders        Ordered    predniSONE (DELTASONE) 20 MG tablet  Daily     03/17/17 0958    HYDROcodone-homatropine (HYCODAN) 5-1.5 MG/5ML syrup  At bedtime PRN     03/17/17 0958    azithromycin (ZITHROMAX) 250 MG tablet  Daily     03/17/17 0958       Blanchie Dessert, MD 03/17/17 [phone]

## 2017-03-17 NOTE — ED Triage Notes (Signed)
Cough x 3 weeks. Denies other symptoms.

## 2017-03-19 DIAGNOSIS — D509 Iron deficiency anemia, unspecified: Secondary | ICD-10-CM | POA: Diagnosis not present

## 2017-03-19 DIAGNOSIS — N2581 Secondary hyperparathyroidism of renal origin: Secondary | ICD-10-CM | POA: Diagnosis not present

## 2017-03-19 DIAGNOSIS — E119 Type 2 diabetes mellitus without complications: Secondary | ICD-10-CM | POA: Diagnosis not present

## 2017-03-19 DIAGNOSIS — N186 End stage renal disease: Secondary | ICD-10-CM | POA: Diagnosis not present

## 2017-03-21 DIAGNOSIS — E119 Type 2 diabetes mellitus without complications: Secondary | ICD-10-CM | POA: Diagnosis not present

## 2017-03-21 DIAGNOSIS — N2581 Secondary hyperparathyroidism of renal origin: Secondary | ICD-10-CM | POA: Diagnosis not present

## 2017-03-21 DIAGNOSIS — N186 End stage renal disease: Secondary | ICD-10-CM | POA: Diagnosis not present

## 2017-03-21 DIAGNOSIS — D509 Iron deficiency anemia, unspecified: Secondary | ICD-10-CM | POA: Diagnosis not present

## 2017-03-23 DIAGNOSIS — N2581 Secondary hyperparathyroidism of renal origin: Secondary | ICD-10-CM | POA: Diagnosis not present

## 2017-03-23 DIAGNOSIS — N186 End stage renal disease: Secondary | ICD-10-CM | POA: Diagnosis not present

## 2017-03-23 DIAGNOSIS — E119 Type 2 diabetes mellitus without complications: Secondary | ICD-10-CM | POA: Diagnosis not present

## 2017-03-23 DIAGNOSIS — D509 Iron deficiency anemia, unspecified: Secondary | ICD-10-CM | POA: Diagnosis not present

## 2017-03-26 DIAGNOSIS — D509 Iron deficiency anemia, unspecified: Secondary | ICD-10-CM | POA: Diagnosis not present

## 2017-03-26 DIAGNOSIS — E119 Type 2 diabetes mellitus without complications: Secondary | ICD-10-CM | POA: Diagnosis not present

## 2017-03-26 DIAGNOSIS — N186 End stage renal disease: Secondary | ICD-10-CM | POA: Diagnosis not present

## 2017-03-26 DIAGNOSIS — N2581 Secondary hyperparathyroidism of renal origin: Secondary | ICD-10-CM | POA: Diagnosis not present

## 2017-03-28 DIAGNOSIS — N186 End stage renal disease: Secondary | ICD-10-CM | POA: Diagnosis not present

## 2017-03-28 DIAGNOSIS — E119 Type 2 diabetes mellitus without complications: Secondary | ICD-10-CM | POA: Diagnosis not present

## 2017-03-28 DIAGNOSIS — D509 Iron deficiency anemia, unspecified: Secondary | ICD-10-CM | POA: Diagnosis not present

## 2017-03-28 DIAGNOSIS — N2581 Secondary hyperparathyroidism of renal origin: Secondary | ICD-10-CM | POA: Diagnosis not present

## 2017-03-30 DIAGNOSIS — D509 Iron deficiency anemia, unspecified: Secondary | ICD-10-CM | POA: Diagnosis not present

## 2017-03-30 DIAGNOSIS — E119 Type 2 diabetes mellitus without complications: Secondary | ICD-10-CM | POA: Diagnosis not present

## 2017-03-30 DIAGNOSIS — N186 End stage renal disease: Secondary | ICD-10-CM | POA: Diagnosis not present

## 2017-03-30 DIAGNOSIS — N2581 Secondary hyperparathyroidism of renal origin: Secondary | ICD-10-CM | POA: Diagnosis not present

## 2017-04-02 DIAGNOSIS — D509 Iron deficiency anemia, unspecified: Secondary | ICD-10-CM | POA: Diagnosis not present

## 2017-04-02 DIAGNOSIS — N186 End stage renal disease: Secondary | ICD-10-CM | POA: Diagnosis not present

## 2017-04-02 DIAGNOSIS — E119 Type 2 diabetes mellitus without complications: Secondary | ICD-10-CM | POA: Diagnosis not present

## 2017-04-02 DIAGNOSIS — N2581 Secondary hyperparathyroidism of renal origin: Secondary | ICD-10-CM | POA: Diagnosis not present

## 2017-04-04 DIAGNOSIS — D509 Iron deficiency anemia, unspecified: Secondary | ICD-10-CM | POA: Diagnosis not present

## 2017-04-04 DIAGNOSIS — N2581 Secondary hyperparathyroidism of renal origin: Secondary | ICD-10-CM | POA: Diagnosis not present

## 2017-04-04 DIAGNOSIS — N186 End stage renal disease: Secondary | ICD-10-CM | POA: Diagnosis not present

## 2017-04-04 DIAGNOSIS — E119 Type 2 diabetes mellitus without complications: Secondary | ICD-10-CM | POA: Diagnosis not present

## 2017-04-06 DIAGNOSIS — N2581 Secondary hyperparathyroidism of renal origin: Secondary | ICD-10-CM | POA: Diagnosis not present

## 2017-04-06 DIAGNOSIS — N186 End stage renal disease: Secondary | ICD-10-CM | POA: Diagnosis not present

## 2017-04-06 DIAGNOSIS — E119 Type 2 diabetes mellitus without complications: Secondary | ICD-10-CM | POA: Diagnosis not present

## 2017-04-06 DIAGNOSIS — D509 Iron deficiency anemia, unspecified: Secondary | ICD-10-CM | POA: Diagnosis not present

## 2017-04-07 DIAGNOSIS — N186 End stage renal disease: Secondary | ICD-10-CM | POA: Diagnosis not present

## 2017-04-07 DIAGNOSIS — T8612 Kidney transplant failure: Secondary | ICD-10-CM | POA: Diagnosis not present

## 2017-04-07 DIAGNOSIS — Z992 Dependence on renal dialysis: Secondary | ICD-10-CM | POA: Diagnosis not present

## 2017-04-08 DIAGNOSIS — Z992 Dependence on renal dialysis: Secondary | ICD-10-CM | POA: Diagnosis not present

## 2017-04-08 DIAGNOSIS — T8612 Kidney transplant failure: Secondary | ICD-10-CM | POA: Diagnosis not present

## 2017-04-08 DIAGNOSIS — N186 End stage renal disease: Secondary | ICD-10-CM | POA: Diagnosis not present

## 2017-04-09 DIAGNOSIS — D509 Iron deficiency anemia, unspecified: Secondary | ICD-10-CM | POA: Diagnosis not present

## 2017-04-09 DIAGNOSIS — E119 Type 2 diabetes mellitus without complications: Secondary | ICD-10-CM | POA: Diagnosis not present

## 2017-04-09 DIAGNOSIS — N2581 Secondary hyperparathyroidism of renal origin: Secondary | ICD-10-CM | POA: Diagnosis not present

## 2017-04-09 DIAGNOSIS — N186 End stage renal disease: Secondary | ICD-10-CM | POA: Diagnosis not present

## 2017-04-11 DIAGNOSIS — N186 End stage renal disease: Secondary | ICD-10-CM | POA: Diagnosis not present

## 2017-04-11 DIAGNOSIS — D509 Iron deficiency anemia, unspecified: Secondary | ICD-10-CM | POA: Diagnosis not present

## 2017-04-11 DIAGNOSIS — N2581 Secondary hyperparathyroidism of renal origin: Secondary | ICD-10-CM | POA: Diagnosis not present

## 2017-04-11 DIAGNOSIS — E119 Type 2 diabetes mellitus without complications: Secondary | ICD-10-CM | POA: Diagnosis not present

## 2017-04-13 DIAGNOSIS — N2581 Secondary hyperparathyroidism of renal origin: Secondary | ICD-10-CM | POA: Diagnosis not present

## 2017-04-13 DIAGNOSIS — E119 Type 2 diabetes mellitus without complications: Secondary | ICD-10-CM | POA: Diagnosis not present

## 2017-04-13 DIAGNOSIS — N186 End stage renal disease: Secondary | ICD-10-CM | POA: Diagnosis not present

## 2017-04-13 DIAGNOSIS — D509 Iron deficiency anemia, unspecified: Secondary | ICD-10-CM | POA: Diagnosis not present

## 2017-04-16 DIAGNOSIS — D509 Iron deficiency anemia, unspecified: Secondary | ICD-10-CM | POA: Diagnosis not present

## 2017-04-16 DIAGNOSIS — N186 End stage renal disease: Secondary | ICD-10-CM | POA: Diagnosis not present

## 2017-04-16 DIAGNOSIS — N2581 Secondary hyperparathyroidism of renal origin: Secondary | ICD-10-CM | POA: Diagnosis not present

## 2017-04-16 DIAGNOSIS — E119 Type 2 diabetes mellitus without complications: Secondary | ICD-10-CM | POA: Diagnosis not present

## 2017-04-18 DIAGNOSIS — E119 Type 2 diabetes mellitus without complications: Secondary | ICD-10-CM | POA: Diagnosis not present

## 2017-04-18 DIAGNOSIS — N2581 Secondary hyperparathyroidism of renal origin: Secondary | ICD-10-CM | POA: Diagnosis not present

## 2017-04-18 DIAGNOSIS — D509 Iron deficiency anemia, unspecified: Secondary | ICD-10-CM | POA: Diagnosis not present

## 2017-04-18 DIAGNOSIS — N186 End stage renal disease: Secondary | ICD-10-CM | POA: Diagnosis not present

## 2017-04-20 DIAGNOSIS — D509 Iron deficiency anemia, unspecified: Secondary | ICD-10-CM | POA: Diagnosis not present

## 2017-04-20 DIAGNOSIS — N186 End stage renal disease: Secondary | ICD-10-CM | POA: Diagnosis not present

## 2017-04-20 DIAGNOSIS — N2581 Secondary hyperparathyroidism of renal origin: Secondary | ICD-10-CM | POA: Diagnosis not present

## 2017-04-20 DIAGNOSIS — E119 Type 2 diabetes mellitus without complications: Secondary | ICD-10-CM | POA: Diagnosis not present

## 2017-04-23 DIAGNOSIS — N186 End stage renal disease: Secondary | ICD-10-CM | POA: Diagnosis not present

## 2017-04-23 DIAGNOSIS — N2581 Secondary hyperparathyroidism of renal origin: Secondary | ICD-10-CM | POA: Diagnosis not present

## 2017-04-23 DIAGNOSIS — E119 Type 2 diabetes mellitus without complications: Secondary | ICD-10-CM | POA: Diagnosis not present

## 2017-04-23 DIAGNOSIS — D509 Iron deficiency anemia, unspecified: Secondary | ICD-10-CM | POA: Diagnosis not present

## 2017-04-25 DIAGNOSIS — E119 Type 2 diabetes mellitus without complications: Secondary | ICD-10-CM | POA: Diagnosis not present

## 2017-04-25 DIAGNOSIS — N2581 Secondary hyperparathyroidism of renal origin: Secondary | ICD-10-CM | POA: Diagnosis not present

## 2017-04-25 DIAGNOSIS — N186 End stage renal disease: Secondary | ICD-10-CM | POA: Diagnosis not present

## 2017-04-25 DIAGNOSIS — D509 Iron deficiency anemia, unspecified: Secondary | ICD-10-CM | POA: Diagnosis not present

## 2017-04-27 DIAGNOSIS — N2581 Secondary hyperparathyroidism of renal origin: Secondary | ICD-10-CM | POA: Diagnosis not present

## 2017-04-27 DIAGNOSIS — N186 End stage renal disease: Secondary | ICD-10-CM | POA: Diagnosis not present

## 2017-04-27 DIAGNOSIS — E119 Type 2 diabetes mellitus without complications: Secondary | ICD-10-CM | POA: Diagnosis not present

## 2017-04-27 DIAGNOSIS — D509 Iron deficiency anemia, unspecified: Secondary | ICD-10-CM | POA: Diagnosis not present

## 2017-04-30 DIAGNOSIS — E119 Type 2 diabetes mellitus without complications: Secondary | ICD-10-CM | POA: Diagnosis not present

## 2017-04-30 DIAGNOSIS — N2581 Secondary hyperparathyroidism of renal origin: Secondary | ICD-10-CM | POA: Diagnosis not present

## 2017-04-30 DIAGNOSIS — N186 End stage renal disease: Secondary | ICD-10-CM | POA: Diagnosis not present

## 2017-04-30 DIAGNOSIS — D509 Iron deficiency anemia, unspecified: Secondary | ICD-10-CM | POA: Diagnosis not present

## 2017-05-02 DIAGNOSIS — D509 Iron deficiency anemia, unspecified: Secondary | ICD-10-CM | POA: Diagnosis not present

## 2017-05-02 DIAGNOSIS — N186 End stage renal disease: Secondary | ICD-10-CM | POA: Diagnosis not present

## 2017-05-02 DIAGNOSIS — E119 Type 2 diabetes mellitus without complications: Secondary | ICD-10-CM | POA: Diagnosis not present

## 2017-05-02 DIAGNOSIS — N2581 Secondary hyperparathyroidism of renal origin: Secondary | ICD-10-CM | POA: Diagnosis not present

## 2017-05-04 DIAGNOSIS — D509 Iron deficiency anemia, unspecified: Secondary | ICD-10-CM | POA: Diagnosis not present

## 2017-05-04 DIAGNOSIS — N2581 Secondary hyperparathyroidism of renal origin: Secondary | ICD-10-CM | POA: Diagnosis not present

## 2017-05-04 DIAGNOSIS — N186 End stage renal disease: Secondary | ICD-10-CM | POA: Diagnosis not present

## 2017-05-04 DIAGNOSIS — E119 Type 2 diabetes mellitus without complications: Secondary | ICD-10-CM | POA: Diagnosis not present

## 2017-05-07 DIAGNOSIS — D509 Iron deficiency anemia, unspecified: Secondary | ICD-10-CM | POA: Diagnosis not present

## 2017-05-07 DIAGNOSIS — N186 End stage renal disease: Secondary | ICD-10-CM | POA: Diagnosis not present

## 2017-05-07 DIAGNOSIS — E119 Type 2 diabetes mellitus without complications: Secondary | ICD-10-CM | POA: Diagnosis not present

## 2017-05-07 DIAGNOSIS — N2581 Secondary hyperparathyroidism of renal origin: Secondary | ICD-10-CM | POA: Diagnosis not present

## 2017-05-08 DIAGNOSIS — T8612 Kidney transplant failure: Secondary | ICD-10-CM | POA: Diagnosis not present

## 2017-05-08 DIAGNOSIS — Z992 Dependence on renal dialysis: Secondary | ICD-10-CM | POA: Diagnosis not present

## 2017-05-08 DIAGNOSIS — N186 End stage renal disease: Secondary | ICD-10-CM | POA: Diagnosis not present

## 2017-05-09 DIAGNOSIS — E119 Type 2 diabetes mellitus without complications: Secondary | ICD-10-CM | POA: Diagnosis not present

## 2017-05-09 DIAGNOSIS — N186 End stage renal disease: Secondary | ICD-10-CM | POA: Diagnosis not present

## 2017-05-09 DIAGNOSIS — N2581 Secondary hyperparathyroidism of renal origin: Secondary | ICD-10-CM | POA: Diagnosis not present

## 2017-05-11 DIAGNOSIS — N2581 Secondary hyperparathyroidism of renal origin: Secondary | ICD-10-CM | POA: Diagnosis not present

## 2017-05-11 DIAGNOSIS — N186 End stage renal disease: Secondary | ICD-10-CM | POA: Diagnosis not present

## 2017-05-11 DIAGNOSIS — E119 Type 2 diabetes mellitus without complications: Secondary | ICD-10-CM | POA: Diagnosis not present

## 2017-05-14 DIAGNOSIS — E119 Type 2 diabetes mellitus without complications: Secondary | ICD-10-CM | POA: Diagnosis not present

## 2017-05-14 DIAGNOSIS — N2581 Secondary hyperparathyroidism of renal origin: Secondary | ICD-10-CM | POA: Diagnosis not present

## 2017-05-14 DIAGNOSIS — N186 End stage renal disease: Secondary | ICD-10-CM | POA: Diagnosis not present

## 2017-05-16 DIAGNOSIS — N2581 Secondary hyperparathyroidism of renal origin: Secondary | ICD-10-CM | POA: Diagnosis not present

## 2017-05-16 DIAGNOSIS — N186 End stage renal disease: Secondary | ICD-10-CM | POA: Diagnosis not present

## 2017-05-16 DIAGNOSIS — E119 Type 2 diabetes mellitus without complications: Secondary | ICD-10-CM | POA: Diagnosis not present

## 2017-05-18 DIAGNOSIS — N2581 Secondary hyperparathyroidism of renal origin: Secondary | ICD-10-CM | POA: Diagnosis not present

## 2017-05-18 DIAGNOSIS — E119 Type 2 diabetes mellitus without complications: Secondary | ICD-10-CM | POA: Diagnosis not present

## 2017-05-18 DIAGNOSIS — N186 End stage renal disease: Secondary | ICD-10-CM | POA: Diagnosis not present

## 2017-05-21 DIAGNOSIS — E119 Type 2 diabetes mellitus without complications: Secondary | ICD-10-CM | POA: Diagnosis not present

## 2017-05-21 DIAGNOSIS — N186 End stage renal disease: Secondary | ICD-10-CM | POA: Diagnosis not present

## 2017-05-21 DIAGNOSIS — N2581 Secondary hyperparathyroidism of renal origin: Secondary | ICD-10-CM | POA: Diagnosis not present

## 2017-05-23 DIAGNOSIS — N186 End stage renal disease: Secondary | ICD-10-CM | POA: Diagnosis not present

## 2017-05-23 DIAGNOSIS — N2581 Secondary hyperparathyroidism of renal origin: Secondary | ICD-10-CM | POA: Diagnosis not present

## 2017-05-23 DIAGNOSIS — E119 Type 2 diabetes mellitus without complications: Secondary | ICD-10-CM | POA: Diagnosis not present

## 2017-05-25 DIAGNOSIS — E119 Type 2 diabetes mellitus without complications: Secondary | ICD-10-CM | POA: Diagnosis not present

## 2017-05-25 DIAGNOSIS — N2581 Secondary hyperparathyroidism of renal origin: Secondary | ICD-10-CM | POA: Diagnosis not present

## 2017-05-25 DIAGNOSIS — N186 End stage renal disease: Secondary | ICD-10-CM | POA: Diagnosis not present

## 2017-05-26 ENCOUNTER — Emergency Department (HOSPITAL_BASED_OUTPATIENT_CLINIC_OR_DEPARTMENT_OTHER)
Admission: EM | Admit: 2017-05-26 | Discharge: 2017-05-26 | Disposition: A | Discharge status: Home / Self Care | Payer: Medicare Other | Attending: Emergency Medicine | Admitting: Emergency Medicine

## 2017-05-26 ENCOUNTER — Other Ambulatory Visit: Payer: Self-pay

## 2017-05-26 ENCOUNTER — Encounter (HOSPITAL_BASED_OUTPATIENT_CLINIC_OR_DEPARTMENT_OTHER): Payer: Self-pay | Admitting: Emergency Medicine

## 2017-05-26 ENCOUNTER — Emergency Department (HOSPITAL_BASED_OUTPATIENT_CLINIC_OR_DEPARTMENT_OTHER): Payer: Medicare Other

## 2017-05-26 DIAGNOSIS — Z87891 Personal history of nicotine dependence: Secondary | ICD-10-CM | POA: Diagnosis not present

## 2017-05-26 DIAGNOSIS — I5020 Unspecified systolic (congestive) heart failure: Secondary | ICD-10-CM | POA: Insufficient documentation

## 2017-05-26 DIAGNOSIS — Z79899 Other long term (current) drug therapy: Secondary | ICD-10-CM | POA: Insufficient documentation

## 2017-05-26 DIAGNOSIS — Z992 Dependence on renal dialysis: Secondary | ICD-10-CM | POA: Diagnosis not present

## 2017-05-26 DIAGNOSIS — R0981 Nasal congestion: Secondary | ICD-10-CM | POA: Insufficient documentation

## 2017-05-26 DIAGNOSIS — N186 End stage renal disease: Secondary | ICD-10-CM | POA: Insufficient documentation

## 2017-05-26 DIAGNOSIS — Z8673 Personal history of transient ischemic attack (TIA), and cerebral infarction without residual deficits: Secondary | ICD-10-CM | POA: Insufficient documentation

## 2017-05-26 DIAGNOSIS — J441 Chronic obstructive pulmonary disease with (acute) exacerbation: Secondary | ICD-10-CM

## 2017-05-26 DIAGNOSIS — I10 Essential (primary) hypertension: Secondary | ICD-10-CM | POA: Diagnosis not present

## 2017-05-26 DIAGNOSIS — R05 Cough: Secondary | ICD-10-CM | POA: Diagnosis not present

## 2017-05-26 DIAGNOSIS — I132 Hypertensive heart and chronic kidney disease with heart failure and with stage 5 chronic kidney disease, or end stage renal disease: Secondary | ICD-10-CM | POA: Insufficient documentation

## 2017-05-26 LAB — BASIC METABOLIC PANEL
Anion gap: 14 (ref 5–15)
BUN: 36 mg/dL — AB (ref 6–20)
CHLORIDE: 99 mmol/L — AB (ref 101–111)
CO2: 26 mmol/L (ref 22–32)
CREATININE: 8.15 mg/dL — AB (ref 0.61–1.24)
Calcium: 9.6 mg/dL (ref 8.9–10.3)
GFR calc Af Amer: 8 mL/min — ABNORMAL LOW (ref >60)
GFR calc non Af Amer: 7 mL/min — ABNORMAL LOW (ref >60)
GLUCOSE: 81 mg/dL (ref 65–99)
POTASSIUM: 4.6 mmol/L (ref 3.5–5.1)
SODIUM: 139 mmol/L (ref 135–145)

## 2017-05-26 LAB — CBC
HCT: 36.6 % — ABNORMAL LOW (ref 39.0–52.0)
HEMOGLOBIN: 12.1 g/dL — AB (ref 13.0–17.0)
MCH: 31.8 pg (ref 26.0–34.0)
MCHC: 33.1 g/dL (ref 30.0–36.0)
MCV: 96.3 fL (ref 78.0–100.0)
Platelets: 52 10*3/uL — ABNORMAL LOW (ref 150–400)
RBC: 3.8 MIL/uL — ABNORMAL LOW (ref 4.22–5.81)
RDW: 16.4 % — ABNORMAL HIGH (ref 11.5–15.5)
WBC: 4.2 10*3/uL (ref 4.0–10.5)

## 2017-05-26 MED ORDER — IPRATROPIUM-ALBUTEROL 0.5-2.5 (3) MG/3ML IN SOLN
3.0000 mL | Freq: Four times a day (QID) | RESPIRATORY_TRACT | Status: DC
  Administered 2017-05-26: 3 mL via RESPIRATORY_TRACT
  Filled 2017-05-26: qty 3

## 2017-05-26 MED ORDER — PREDNISONE 50 MG PO TABS
60.0000 mg | ORAL_TABLET | Freq: Once | ORAL | Status: AC
  Administered 2017-05-26: 60 mg via ORAL
  Filled 2017-05-26: qty 1

## 2017-05-26 MED ORDER — DOXYCYCLINE HYCLATE 100 MG PO CAPS: 100.0000 mg | ORAL_CAPSULE | Freq: Two times a day (BID) | ORAL | 0 refills | Status: DC

## 2017-05-26 MED ORDER — PREDNISONE 50 MG PO TABS: 50.0000 mg | ORAL_TABLET | Freq: Every day | ORAL | 0 refills | Status: DC

## 2017-05-26 NOTE — ED Notes (Addendum)
Rx x 2 given for doxycycline and prednisone. Ambulated to d/c window independently

## 2017-05-26 NOTE — ED Provider Notes (Signed)
[facility] EMERGENCY DEPARTMENT Provider Note   CSN: [id number] Arrival date & time: 05/26/17  1234     History   Chief Complaint Chief Complaint  Patient presents with  . Cough    HPI Nathaniel White is a 57 y.o. male.  HPI Patient presents to the emergency room for evaluation of cough and congestion.  Patient feels like his allergies are acting up.  He had a having a cough a couple of days ago.  He is noticed some itchy red eyes.  He denies any trouble with any chest pain.  No fevers.  Patient does have a history of chronic renal failure.  He goes to dialysis and has not missed any recent sessions.  He last went yesterday.  Patient also has a remote smoking history.  Patient states he has had some trouble with wheezing in the past. Past Medical History:  Diagnosis Date  . Anemia   . Anxiety   . Asthma   . CHF (congestive heart failure) ([place])   . Complication of anesthesia    according to pt and spouse pt was moving around and cough while under and pt had difficulty waking up so the anesthesia had to be reversed. and pt admitted to ICU.   Marland Kitchen COPD (chronic obstructive pulmonary disease) ([place])   . Diabetes mellitus without complication ([place])    not on medications  . ESRD    on HD, M- W- F   . Hemiparesis due to old stroke ([place])    left  . Hypertension   . LV dysfunction    EF 25-30% by echo 07/2011  . Memory loss due to medical condition    due to stroke  . MR (mitral regurgitation)    moderate to severe, echo 07/2011  . Shortness of breath   . Stroke [facility])    TIA's-left sided weakness  . Tobacco abuse     Patient Active Problem List   Diagnosis Date Noted  . Thrombocytopenia ([place]) 01/17/2015  . Transfusion history 01/17/2015  . Hemodialysis AV fistula aneurysm ([place]) 11/10/2013  . Complication from renal dialysis device 11/02/2013  . Hyperkalemia 10/06/2013  . Cough 09/07/2013  . CHF, systolic dysfunction 25/05/3974  . Other complications due to  renal dialysis device, implant, and graft 10/27/2012  . Cerebral embolism with cerebral infarction ([place]) 08/13/2011  . Shortness of breath 03/13/2011  . ESRD (end stage renal disease) on dialysis ([place]) 03/13/2011  . HTN (hypertension) 03/13/2011  . H/O: CVA (cardiovascular accident) 03/13/2011  . Pleural effusion, right chronic 03/13/2011  . Anemia 03/13/2011  . Fluid overload 03/13/2011  . History of nonadherence to medical treatment 03/13/2011  . DM (diabetes mellitus) ([employer]) 03/13/2011  . Tobacco abuse 03/13/2011  . Essential hypertension, benign 10/04/2008  . ESOPHAGEAL VARICES 10/04/2008  . HEMATOCHEZIA 10/04/2008  . End stage renal disease ([place]) 10/04/2008    Past Surgical History:  Procedure Laterality Date  . AV FISTULA PLACEMENT    . CARDIAC CATHETERIZATION     [facility]  . COLONOSCOPY W/ BIOPSIES AND POLYPECTOMY    . FISTULA SUPERFICIALIZATION Left 11/10/2013   Procedure: FISTULA PLICATION;  Surgeon: Serafina Mitchell, MD;  Location: [facility];  Service: Vascular;  Laterality: Left;  . KIDNEY TRANSPLANT     2011 rejected kidney 2012 back on dialysis  . LEFT AND RIGHT HEART CATHETERIZATION WITH CORONARY ANGIOGRAM N/A 10/09/2013   Procedure: LEFT AND RIGHT HEART CATHETERIZATION WITH CORONARY ANGIOGRAM;  Surgeon: Burnell Blanks, MD;  Location:  [facility] CATH LAB;  Service: Cardiovascular;  Laterality: N/A;  . REVISON OF ARTERIOVENOUS FISTULA Left 08/26/2013   Procedure: EXCISION OF ERODED SKIN AND EXPLORATION OF MAIN LEFT UPPER ARM AV FISTULA;  Surgeon: Rosetta Posner, MD;  Location: [facility];  Service: Vascular;  Laterality: Left;  . REVISON OF ARTERIOVENOUS FISTULA Left 02/05/2014   Procedure: REPAIR OF ARTERIOVENOUS FISTULA ANEURYSM;  Surgeon: Serafina Mitchell, MD;  Location: [facility];  Service: Vascular;  Laterality: Left;  . SHUNTOGRAM N/A 11/05/2012   Procedure: Fistulogram;  Surgeon: Serafina Mitchell, MD;  Location: [facility] C[facility] LAB;  Service: Cardiovascular;  Laterality: N/A;  . TEE  WITHOUT CARDIOVERSION  08/17/2011   Procedure: TRANSESOPHAGEAL ECHOCARDIOGRAM (TEE);  Surgeon: Larey Dresser, MD;  Location: [facility];  Service: Cardiovascular;  Laterality: N/A;  . VIDEO ASSISTED THORACOSCOPY (VATS)/DECORTICATION  08/10/11        Home Medications    Prior to Admission medications   Medication Sig Start Date End Date Taking? Authorizing Provider  albuterol (PROVENTIL HFA;VENTOLIN HFA) 108 (90 Base) MCG/ACT inhaler Inhale 1-2 puffs into the lungs every 6 (six) hours as needed for wheezing or shortness of breath. 05/02/16   Palumbo, April, MD  azithromycin (ZITHROMAX) 250 MG tablet Take 1 tablet (250 mg total) by mouth daily. Take first 2 tablets together, then 1 every day until finished. 03/17/17   Blanchie Dessert, MD  benzonatate (TESSALON) 100 MG capsule Take 1 capsule (100 mg total) by mouth every 8 (eight) hours. 05/02/16   Palumbo, April, MD  cetirizine (ZYRTEC ALLERGY) 10 MG tablet Take 1 tablet (10 mg total) by mouth daily. 05/02/16   Palumbo, April, MD  clonazePAM Bobbye [place]) 1 MG tablet 1 mg 01/27/10   [provider]  cloNIDine (CATAPRES-TTS-3) 0.3 mg/24hr patch WEEKLY 01/27/10   [provider]  doxycycline (VIBRAMYCIN) 100 MG capsule Take 1 capsule (100 mg total) by mouth 2 (two) times daily. 05/26/17   Dorie Rank, MD  Fluticasone-Salmeterol (ADVAIR) 250-50 MCG/DOSE AEPB Inhale 2 puffs into the lungs daily at 12 noon.    [provider]  FOSRENOL 1000 MG chewable tablet CHEW & SWALLOW 2 TABLETS BY MOUTH WITH MEALS 12/25/14   [provider]  glucose blood (ONE TOUCH ULTRA TEST) test strip  12/26/09   [provider]  HYDROcodone-homatropine (HYCODAN) 5-1.5 MG/5ML syrup Take 5 mLs by mouth at bedtime as needed for cough (take at night to help with cough). 03/17/17   Blanchie Dessert, MD  labetalol (NORMODYNE) 300 MG tablet Take 300 mg by mouth 2 (two) times daily.     [provider]  predniSONE (DELTASONE) 50 MG  tablet Take 1 tablet (50 mg total) by mouth daily. 05/26/17   Dorie Rank, MD  SENSIPAR 30 MG tablet  01/11/15   [provider]  tadalafil (CIALIS) 5 MG tablet Take 5 mg by mouth daily as needed for erectile dysfunction.    [provider]  traMADol (ULTRAM) 50 MG tablet Take 1 tablet (50 mg total) by mouth every 6 (six) hours as needed. 02/05/14   Alvia Grove, PA-C    Family History Family History  Problem Relation Age of Onset  . Hypertension Mother   . Varicose Veins Mother   . Diabetes Paternal Grandmother   . Cancer Paternal Grandfather   . CAD Paternal Uncle     Social History Social History   Tobacco Use  . Smoking status: Former Smoker    Packs/day: 0.50    Years: 32.00  Pack years: 16.00    Types: Cigarettes    Last attempt to quit: 10/13/2012    Years since quitting: 4.6  . Smokeless tobacco: Never Used  Substance Use Topics  . Alcohol use: No    Alcohol/week: 0.0 oz  . Drug use: No     Allergies   Codeine   Review of Systems Review of Systems  All other systems reviewed and are negative.    Physical Exam Updated Vital Signs BP 122/88 (BP Location: Right Arm)   Pulse 93   Temp 98.3 F (36.8 C) (Oral)   Resp 16   Ht 1.753 m (5\' 9" )   Wt 77.1 kg (170 lb)   SpO2 95%   BMI 25.10 kg/m   Physical Exam  Constitutional: He appears well-developed and well-nourished. No distress.  HENT:  Head: Normocephalic and atraumatic.  Right Ear: External ear normal.  Left Ear: External ear normal.  Eyes: Conjunctivae are normal. Right eye exhibits no discharge. Left eye exhibits no discharge. No scleral icterus.  Pterygium left greater than right  Neck: Neck supple. No tracheal deviation present.  Cardiovascular: Normal rate, regular rhythm and intact distal pulses.  Pulmonary/Chest: Effort normal. No stridor. No respiratory distress. He has wheezes. He has no rales.  Abdominal: Soft. Bowel sounds are normal. He exhibits no distension.  There is no tenderness. There is no rebound and no guarding.  Musculoskeletal: He exhibits no edema or tenderness.  Neurological: He is alert. He has normal strength. No cranial nerve deficit (no facial droop, extraocular movements intact, no slurred speech) or sensory deficit. He exhibits normal muscle tone. He displays no seizure activity. Coordination normal.  Skin: Skin is warm and dry. No rash noted.  Psychiatric: He has a normal mood and affect.  Nursing note and vitals reviewed.    ED Treatments / Results  Labs (all labs ordered are listed, but only abnormal results are displayed) Labs Reviewed  CBC - Abnormal; Notable for the following components:      Result Value   RBC 3.80 (*)    Hemoglobin 12.1 (*)    HCT 36.6 (*)    RDW 16.4 (*)    Platelets 52 (*)    All other components within normal limits  BASIC METABOLIC PANEL - Abnormal; Notable for the following components:   Chloride 99 (*)    BUN 36 (*)    Creatinine, Ser 8.15 (*)    GFR calc non Af Amer 7 (*)    GFR calc Af Amer 8 (*)    All other components within normal limits    EKG EKG Interpretation  Date/Time:  Sunday May 26 2017 13:02:24 EDT Ventricular Rate:  95 PR Interval:    QRS Duration: 121 QT Interval:  397 QTC Calculation: 500 R Axis:   25 Text Interpretation:  Sinus rhythm Multiform ventricular premature complexes Borderline prolonged PR interval Probable LVH with secondary repol abnrm Probable lateral infarct, age indeterminate Borderline prolonged QT interval pvcs are new since last tracing Confirmed by Dorie Rank [phone]) on 05/26/2017 1:04:38 PM   Radiology Dg Chest 2 View  Result Date: 05/26/2017 CLINICAL DATA:  Patient with cough. EXAM: CHEST - 2 VIEW COMPARISON:  Chest radiograph 03/17/2017. FINDINGS: Monitoring leads overlie the patient. Stable cardiomegaly. Interval development of patchy consolidative opacities within the left mid and lower lung. Re demonstrated chronic scarring within the  right mid and lower lung. Blunting of the right costophrenic angle. Lateral view nondiagnostic. IMPRESSION: Cardiomegaly. Increased heterogeneous opacities left mid  and lower lung may represent superimposed edema or infection. Chronic changes right lower lung. Electronically Signed   By: Lovey Newcomer M.D.   On: 05/26/2017 13:50    Procedures Procedures (including critical care time)  Medications Ordered in ED Medications  ipratropium-albuterol (DUONEB) 0.5-2.5 (3) MG/3ML nebulizer solution 3 mL (3 mLs Nebulization Given 05/26/17 1349)  predniSONE (DELTASONE) tablet 60 mg (60 mg Oral Given 05/26/17 1416)     Initial Impression / Assessment and Plan / ED Course  I have reviewed the triage vital signs and the nursing notes.  Pertinent labs & imaging results that were available during my care of the patient were reviewed by me and considered in my medical decision making (see chart for details).  Clinical Course as of May 26 1528  Sun May 26, 2017  1519 Labs c/w his chronic renal failure.  CXR with edema vs infection.  Pt has been going to dialysis regularly.  Denies shortness of breath.  Will treat for possible infection.   [JK]    Clinical Course User Index [JK] Dorie Rank, MD    Patient presented to the emergency room for evaluation of cough and congestion.  Patient has history of COPD.  Patient wheezing on initial exam.  Patient was treated with breathing treatments and steroids.  He feels significantly better.  Chest x-ray shows the possibility of pneumonia versus edema.  Patient has not missed dialysis.  He denies any shortness of breath.  There may be a combination of mild edema associated with infection.  Patient is nontoxic and afebrile.  He would like to go home.  I will discharge him home on a course of oral antibiotics and steroids.  He will continue his inhalers.  Final Clinical Impressions(s) / ED Diagnoses   Final diagnoses:  COPD exacerbation [facility])    ED Discharge Orders          Ordered    doxycycline (VIBRAMYCIN) 100 MG capsule  2 times daily     05/26/17 1528    predniSONE (DELTASONE) 50 MG tablet  Daily     05/26/17 1528       Dorie Rank, MD 05/26/17 [phone]

## 2017-05-26 NOTE — ED Notes (Signed)
Delay in xray due to ED tech starting EKG

## 2017-05-26 NOTE — ED Notes (Signed)
Patient transported to X-ray and returned at this time

## 2017-05-26 NOTE — Discharge Instructions (Addendum)
Take the medications as prescribed, follow-up with dialysis as planned, follow-up with your primary care doctor in the next week to make sure your symptoms are improving.  Return for fever or worsening shortness of breath

## 2017-05-26 NOTE — ED Triage Notes (Signed)
Patient states that he needs something for his allergies, he has a dry hacking cough and red eyes

## 2017-05-28 DIAGNOSIS — E119 Type 2 diabetes mellitus without complications: Secondary | ICD-10-CM | POA: Diagnosis not present

## 2017-05-28 DIAGNOSIS — N186 End stage renal disease: Secondary | ICD-10-CM | POA: Diagnosis not present

## 2017-05-28 DIAGNOSIS — N2581 Secondary hyperparathyroidism of renal origin: Secondary | ICD-10-CM | POA: Diagnosis not present

## 2017-05-30 DIAGNOSIS — N186 End stage renal disease: Secondary | ICD-10-CM | POA: Diagnosis not present

## 2017-05-30 DIAGNOSIS — N2581 Secondary hyperparathyroidism of renal origin: Secondary | ICD-10-CM | POA: Diagnosis not present

## 2017-05-30 DIAGNOSIS — E119 Type 2 diabetes mellitus without complications: Secondary | ICD-10-CM | POA: Diagnosis not present

## 2017-06-01 DIAGNOSIS — N186 End stage renal disease: Secondary | ICD-10-CM | POA: Diagnosis not present

## 2017-06-01 DIAGNOSIS — N2581 Secondary hyperparathyroidism of renal origin: Secondary | ICD-10-CM | POA: Diagnosis not present

## 2017-06-01 DIAGNOSIS — E119 Type 2 diabetes mellitus without complications: Secondary | ICD-10-CM | POA: Diagnosis not present

## 2017-06-04 DIAGNOSIS — E119 Type 2 diabetes mellitus without complications: Secondary | ICD-10-CM | POA: Diagnosis not present

## 2017-06-04 DIAGNOSIS — N2581 Secondary hyperparathyroidism of renal origin: Secondary | ICD-10-CM | POA: Diagnosis not present

## 2017-06-04 DIAGNOSIS — N186 End stage renal disease: Secondary | ICD-10-CM | POA: Diagnosis not present

## 2017-06-06 DIAGNOSIS — N186 End stage renal disease: Secondary | ICD-10-CM | POA: Diagnosis not present

## 2017-06-06 DIAGNOSIS — E119 Type 2 diabetes mellitus without complications: Secondary | ICD-10-CM | POA: Diagnosis not present

## 2017-06-06 DIAGNOSIS — N2581 Secondary hyperparathyroidism of renal origin: Secondary | ICD-10-CM | POA: Diagnosis not present

## 2017-06-07 DIAGNOSIS — E8770 Fluid overload, unspecified: Secondary | ICD-10-CM | POA: Diagnosis not present

## 2017-06-07 DIAGNOSIS — N2581 Secondary hyperparathyroidism of renal origin: Secondary | ICD-10-CM | POA: Diagnosis not present

## 2017-06-07 DIAGNOSIS — N186 End stage renal disease: Secondary | ICD-10-CM | POA: Diagnosis not present

## 2017-06-08 DIAGNOSIS — Z992 Dependence on renal dialysis: Secondary | ICD-10-CM | POA: Diagnosis not present

## 2017-06-08 DIAGNOSIS — N2581 Secondary hyperparathyroidism of renal origin: Secondary | ICD-10-CM | POA: Diagnosis not present

## 2017-06-08 DIAGNOSIS — T8612 Kidney transplant failure: Secondary | ICD-10-CM | POA: Diagnosis not present

## 2017-06-08 DIAGNOSIS — D631 Anemia in chronic kidney disease: Secondary | ICD-10-CM | POA: Diagnosis not present

## 2017-06-08 DIAGNOSIS — N186 End stage renal disease: Secondary | ICD-10-CM | POA: Diagnosis not present

## 2017-06-08 DIAGNOSIS — E119 Type 2 diabetes mellitus without complications: Secondary | ICD-10-CM | POA: Diagnosis not present

## 2017-06-11 DIAGNOSIS — N186 End stage renal disease: Secondary | ICD-10-CM | POA: Diagnosis not present

## 2017-06-11 DIAGNOSIS — E119 Type 2 diabetes mellitus without complications: Secondary | ICD-10-CM | POA: Diagnosis not present

## 2017-06-11 DIAGNOSIS — D631 Anemia in chronic kidney disease: Secondary | ICD-10-CM | POA: Diagnosis not present

## 2017-06-11 DIAGNOSIS — N2581 Secondary hyperparathyroidism of renal origin: Secondary | ICD-10-CM | POA: Diagnosis not present

## 2017-06-13 DIAGNOSIS — N2581 Secondary hyperparathyroidism of renal origin: Secondary | ICD-10-CM | POA: Diagnosis not present

## 2017-06-13 DIAGNOSIS — N186 End stage renal disease: Secondary | ICD-10-CM | POA: Diagnosis not present

## 2017-06-13 DIAGNOSIS — E119 Type 2 diabetes mellitus without complications: Secondary | ICD-10-CM | POA: Diagnosis not present

## 2017-06-13 DIAGNOSIS — D631 Anemia in chronic kidney disease: Secondary | ICD-10-CM | POA: Diagnosis not present

## 2017-06-15 DIAGNOSIS — D631 Anemia in chronic kidney disease: Secondary | ICD-10-CM | POA: Diagnosis not present

## 2017-06-15 DIAGNOSIS — E119 Type 2 diabetes mellitus without complications: Secondary | ICD-10-CM | POA: Diagnosis not present

## 2017-06-15 DIAGNOSIS — N2581 Secondary hyperparathyroidism of renal origin: Secondary | ICD-10-CM | POA: Diagnosis not present

## 2017-06-15 DIAGNOSIS — N186 End stage renal disease: Secondary | ICD-10-CM | POA: Diagnosis not present

## 2017-06-18 DIAGNOSIS — N186 End stage renal disease: Secondary | ICD-10-CM | POA: Diagnosis not present

## 2017-06-18 DIAGNOSIS — D631 Anemia in chronic kidney disease: Secondary | ICD-10-CM | POA: Diagnosis not present

## 2017-06-18 DIAGNOSIS — E119 Type 2 diabetes mellitus without complications: Secondary | ICD-10-CM | POA: Diagnosis not present

## 2017-06-18 DIAGNOSIS — N2581 Secondary hyperparathyroidism of renal origin: Secondary | ICD-10-CM | POA: Diagnosis not present

## 2017-06-20 DIAGNOSIS — N186 End stage renal disease: Secondary | ICD-10-CM | POA: Diagnosis not present

## 2017-06-20 DIAGNOSIS — E119 Type 2 diabetes mellitus without complications: Secondary | ICD-10-CM | POA: Diagnosis not present

## 2017-06-20 DIAGNOSIS — N2581 Secondary hyperparathyroidism of renal origin: Secondary | ICD-10-CM | POA: Diagnosis not present

## 2017-06-20 DIAGNOSIS — D631 Anemia in chronic kidney disease: Secondary | ICD-10-CM | POA: Diagnosis not present

## 2017-06-22 DIAGNOSIS — D631 Anemia in chronic kidney disease: Secondary | ICD-10-CM | POA: Diagnosis not present

## 2017-06-22 DIAGNOSIS — N186 End stage renal disease: Secondary | ICD-10-CM | POA: Diagnosis not present

## 2017-06-22 DIAGNOSIS — N2581 Secondary hyperparathyroidism of renal origin: Secondary | ICD-10-CM | POA: Diagnosis not present

## 2017-06-22 DIAGNOSIS — E119 Type 2 diabetes mellitus without complications: Secondary | ICD-10-CM | POA: Diagnosis not present

## 2017-06-25 DIAGNOSIS — E119 Type 2 diabetes mellitus without complications: Secondary | ICD-10-CM | POA: Diagnosis not present

## 2017-06-25 DIAGNOSIS — D631 Anemia in chronic kidney disease: Secondary | ICD-10-CM | POA: Diagnosis not present

## 2017-06-25 DIAGNOSIS — N2581 Secondary hyperparathyroidism of renal origin: Secondary | ICD-10-CM | POA: Diagnosis not present

## 2017-06-25 DIAGNOSIS — N186 End stage renal disease: Secondary | ICD-10-CM | POA: Diagnosis not present

## 2017-06-27 DIAGNOSIS — D631 Anemia in chronic kidney disease: Secondary | ICD-10-CM | POA: Diagnosis not present

## 2017-06-27 DIAGNOSIS — N186 End stage renal disease: Secondary | ICD-10-CM | POA: Diagnosis not present

## 2017-06-27 DIAGNOSIS — N2581 Secondary hyperparathyroidism of renal origin: Secondary | ICD-10-CM | POA: Diagnosis not present

## 2017-06-27 DIAGNOSIS — E119 Type 2 diabetes mellitus without complications: Secondary | ICD-10-CM | POA: Diagnosis not present

## 2017-06-29 DIAGNOSIS — N186 End stage renal disease: Secondary | ICD-10-CM | POA: Diagnosis not present

## 2017-06-29 DIAGNOSIS — N2581 Secondary hyperparathyroidism of renal origin: Secondary | ICD-10-CM | POA: Diagnosis not present

## 2017-06-29 DIAGNOSIS — E119 Type 2 diabetes mellitus without complications: Secondary | ICD-10-CM | POA: Diagnosis not present

## 2017-06-29 DIAGNOSIS — D631 Anemia in chronic kidney disease: Secondary | ICD-10-CM | POA: Diagnosis not present

## 2017-07-02 DIAGNOSIS — E119 Type 2 diabetes mellitus without complications: Secondary | ICD-10-CM | POA: Diagnosis not present

## 2017-07-02 DIAGNOSIS — N2581 Secondary hyperparathyroidism of renal origin: Secondary | ICD-10-CM | POA: Diagnosis not present

## 2017-07-02 DIAGNOSIS — N186 End stage renal disease: Secondary | ICD-10-CM | POA: Diagnosis not present

## 2017-07-02 DIAGNOSIS — D631 Anemia in chronic kidney disease: Secondary | ICD-10-CM | POA: Diagnosis not present

## 2017-07-04 DIAGNOSIS — N186 End stage renal disease: Secondary | ICD-10-CM | POA: Diagnosis not present

## 2017-07-04 DIAGNOSIS — N2581 Secondary hyperparathyroidism of renal origin: Secondary | ICD-10-CM | POA: Diagnosis not present

## 2017-07-04 DIAGNOSIS — D631 Anemia in chronic kidney disease: Secondary | ICD-10-CM | POA: Diagnosis not present

## 2017-07-04 DIAGNOSIS — E119 Type 2 diabetes mellitus without complications: Secondary | ICD-10-CM | POA: Diagnosis not present

## 2017-07-06 DIAGNOSIS — N2581 Secondary hyperparathyroidism of renal origin: Secondary | ICD-10-CM | POA: Diagnosis not present

## 2017-07-06 DIAGNOSIS — D631 Anemia in chronic kidney disease: Secondary | ICD-10-CM | POA: Diagnosis not present

## 2017-07-06 DIAGNOSIS — N186 End stage renal disease: Secondary | ICD-10-CM | POA: Diagnosis not present

## 2017-07-06 DIAGNOSIS — E119 Type 2 diabetes mellitus without complications: Secondary | ICD-10-CM | POA: Diagnosis not present

## 2017-07-08 DIAGNOSIS — Z992 Dependence on renal dialysis: Secondary | ICD-10-CM | POA: Diagnosis not present

## 2017-07-08 DIAGNOSIS — T8612 Kidney transplant failure: Secondary | ICD-10-CM | POA: Diagnosis not present

## 2017-07-08 DIAGNOSIS — N186 End stage renal disease: Secondary | ICD-10-CM | POA: Diagnosis not present

## 2017-07-09 DIAGNOSIS — N2581 Secondary hyperparathyroidism of renal origin: Secondary | ICD-10-CM | POA: Diagnosis not present

## 2017-07-09 DIAGNOSIS — D631 Anemia in chronic kidney disease: Secondary | ICD-10-CM | POA: Diagnosis not present

## 2017-07-09 DIAGNOSIS — N186 End stage renal disease: Secondary | ICD-10-CM | POA: Diagnosis not present

## 2017-07-09 DIAGNOSIS — E119 Type 2 diabetes mellitus without complications: Secondary | ICD-10-CM | POA: Diagnosis not present

## 2017-07-11 DIAGNOSIS — N186 End stage renal disease: Secondary | ICD-10-CM | POA: Diagnosis not present

## 2017-07-11 DIAGNOSIS — D631 Anemia in chronic kidney disease: Secondary | ICD-10-CM | POA: Diagnosis not present

## 2017-07-11 DIAGNOSIS — E119 Type 2 diabetes mellitus without complications: Secondary | ICD-10-CM | POA: Diagnosis not present

## 2017-07-11 DIAGNOSIS — N2581 Secondary hyperparathyroidism of renal origin: Secondary | ICD-10-CM | POA: Diagnosis not present

## 2017-07-13 DIAGNOSIS — D631 Anemia in chronic kidney disease: Secondary | ICD-10-CM | POA: Diagnosis not present

## 2017-07-13 DIAGNOSIS — N186 End stage renal disease: Secondary | ICD-10-CM | POA: Diagnosis not present

## 2017-07-13 DIAGNOSIS — N2581 Secondary hyperparathyroidism of renal origin: Secondary | ICD-10-CM | POA: Diagnosis not present

## 2017-07-13 DIAGNOSIS — E119 Type 2 diabetes mellitus without complications: Secondary | ICD-10-CM | POA: Diagnosis not present

## 2017-07-16 DIAGNOSIS — E119 Type 2 diabetes mellitus without complications: Secondary | ICD-10-CM | POA: Diagnosis not present

## 2017-07-16 DIAGNOSIS — N2581 Secondary hyperparathyroidism of renal origin: Secondary | ICD-10-CM | POA: Diagnosis not present

## 2017-07-16 DIAGNOSIS — D631 Anemia in chronic kidney disease: Secondary | ICD-10-CM | POA: Diagnosis not present

## 2017-07-16 DIAGNOSIS — N186 End stage renal disease: Secondary | ICD-10-CM | POA: Diagnosis not present

## 2017-07-18 DIAGNOSIS — N186 End stage renal disease: Secondary | ICD-10-CM | POA: Diagnosis not present

## 2017-07-18 DIAGNOSIS — D631 Anemia in chronic kidney disease: Secondary | ICD-10-CM | POA: Diagnosis not present

## 2017-07-18 DIAGNOSIS — E119 Type 2 diabetes mellitus without complications: Secondary | ICD-10-CM | POA: Diagnosis not present

## 2017-07-18 DIAGNOSIS — N2581 Secondary hyperparathyroidism of renal origin: Secondary | ICD-10-CM | POA: Diagnosis not present

## 2017-07-20 DIAGNOSIS — D631 Anemia in chronic kidney disease: Secondary | ICD-10-CM | POA: Diagnosis not present

## 2017-07-20 DIAGNOSIS — E119 Type 2 diabetes mellitus without complications: Secondary | ICD-10-CM | POA: Diagnosis not present

## 2017-07-20 DIAGNOSIS — N186 End stage renal disease: Secondary | ICD-10-CM | POA: Diagnosis not present

## 2017-07-20 DIAGNOSIS — N2581 Secondary hyperparathyroidism of renal origin: Secondary | ICD-10-CM | POA: Diagnosis not present

## 2017-07-23 DIAGNOSIS — D631 Anemia in chronic kidney disease: Secondary | ICD-10-CM | POA: Diagnosis not present

## 2017-07-23 DIAGNOSIS — N2581 Secondary hyperparathyroidism of renal origin: Secondary | ICD-10-CM | POA: Diagnosis not present

## 2017-07-23 DIAGNOSIS — E119 Type 2 diabetes mellitus without complications: Secondary | ICD-10-CM | POA: Diagnosis not present

## 2017-07-23 DIAGNOSIS — N186 End stage renal disease: Secondary | ICD-10-CM | POA: Diagnosis not present

## 2017-07-25 DIAGNOSIS — D631 Anemia in chronic kidney disease: Secondary | ICD-10-CM | POA: Diagnosis not present

## 2017-07-25 DIAGNOSIS — E119 Type 2 diabetes mellitus without complications: Secondary | ICD-10-CM | POA: Diagnosis not present

## 2017-07-25 DIAGNOSIS — N2581 Secondary hyperparathyroidism of renal origin: Secondary | ICD-10-CM | POA: Diagnosis not present

## 2017-07-25 DIAGNOSIS — N186 End stage renal disease: Secondary | ICD-10-CM | POA: Diagnosis not present

## 2017-07-27 DIAGNOSIS — D631 Anemia in chronic kidney disease: Secondary | ICD-10-CM | POA: Diagnosis not present

## 2017-07-27 DIAGNOSIS — E119 Type 2 diabetes mellitus without complications: Secondary | ICD-10-CM | POA: Diagnosis not present

## 2017-07-27 DIAGNOSIS — N186 End stage renal disease: Secondary | ICD-10-CM | POA: Diagnosis not present

## 2017-07-27 DIAGNOSIS — N2581 Secondary hyperparathyroidism of renal origin: Secondary | ICD-10-CM | POA: Diagnosis not present

## 2017-07-30 DIAGNOSIS — N186 End stage renal disease: Secondary | ICD-10-CM | POA: Diagnosis not present

## 2017-07-30 DIAGNOSIS — E119 Type 2 diabetes mellitus without complications: Secondary | ICD-10-CM | POA: Diagnosis not present

## 2017-07-30 DIAGNOSIS — D631 Anemia in chronic kidney disease: Secondary | ICD-10-CM | POA: Diagnosis not present

## 2017-07-30 DIAGNOSIS — N2581 Secondary hyperparathyroidism of renal origin: Secondary | ICD-10-CM | POA: Diagnosis not present

## 2017-08-01 DIAGNOSIS — D631 Anemia in chronic kidney disease: Secondary | ICD-10-CM | POA: Diagnosis not present

## 2017-08-01 DIAGNOSIS — N2581 Secondary hyperparathyroidism of renal origin: Secondary | ICD-10-CM | POA: Diagnosis not present

## 2017-08-01 DIAGNOSIS — N186 End stage renal disease: Secondary | ICD-10-CM | POA: Diagnosis not present

## 2017-08-01 DIAGNOSIS — E119 Type 2 diabetes mellitus without complications: Secondary | ICD-10-CM | POA: Diagnosis not present

## 2017-08-03 DIAGNOSIS — D631 Anemia in chronic kidney disease: Secondary | ICD-10-CM | POA: Diagnosis not present

## 2017-08-03 DIAGNOSIS — E119 Type 2 diabetes mellitus without complications: Secondary | ICD-10-CM | POA: Diagnosis not present

## 2017-08-03 DIAGNOSIS — N186 End stage renal disease: Secondary | ICD-10-CM | POA: Diagnosis not present

## 2017-08-03 DIAGNOSIS — N2581 Secondary hyperparathyroidism of renal origin: Secondary | ICD-10-CM | POA: Diagnosis not present

## 2017-08-06 DIAGNOSIS — D631 Anemia in chronic kidney disease: Secondary | ICD-10-CM | POA: Diagnosis not present

## 2017-08-06 DIAGNOSIS — E119 Type 2 diabetes mellitus without complications: Secondary | ICD-10-CM | POA: Diagnosis not present

## 2017-08-06 DIAGNOSIS — N2581 Secondary hyperparathyroidism of renal origin: Secondary | ICD-10-CM | POA: Diagnosis not present

## 2017-08-06 DIAGNOSIS — N186 End stage renal disease: Secondary | ICD-10-CM | POA: Diagnosis not present

## 2017-08-08 DIAGNOSIS — Z992 Dependence on renal dialysis: Secondary | ICD-10-CM | POA: Diagnosis not present

## 2017-08-08 DIAGNOSIS — N186 End stage renal disease: Secondary | ICD-10-CM | POA: Diagnosis not present

## 2017-08-08 DIAGNOSIS — E119 Type 2 diabetes mellitus without complications: Secondary | ICD-10-CM | POA: Diagnosis not present

## 2017-08-08 DIAGNOSIS — T8612 Kidney transplant failure: Secondary | ICD-10-CM | POA: Diagnosis not present

## 2017-08-08 DIAGNOSIS — N2581 Secondary hyperparathyroidism of renal origin: Secondary | ICD-10-CM | POA: Diagnosis not present

## 2017-08-10 DIAGNOSIS — N186 End stage renal disease: Secondary | ICD-10-CM | POA: Diagnosis not present

## 2017-08-10 DIAGNOSIS — E119 Type 2 diabetes mellitus without complications: Secondary | ICD-10-CM | POA: Diagnosis not present

## 2017-08-10 DIAGNOSIS — N2581 Secondary hyperparathyroidism of renal origin: Secondary | ICD-10-CM | POA: Diagnosis not present

## 2017-08-12 DIAGNOSIS — E119 Type 2 diabetes mellitus without complications: Secondary | ICD-10-CM | POA: Diagnosis not present

## 2017-08-12 DIAGNOSIS — N186 End stage renal disease: Secondary | ICD-10-CM | POA: Diagnosis not present

## 2017-08-12 DIAGNOSIS — N2581 Secondary hyperparathyroidism of renal origin: Secondary | ICD-10-CM | POA: Diagnosis not present

## 2017-08-15 DIAGNOSIS — N186 End stage renal disease: Secondary | ICD-10-CM | POA: Diagnosis not present

## 2017-08-15 DIAGNOSIS — E119 Type 2 diabetes mellitus without complications: Secondary | ICD-10-CM | POA: Diagnosis not present

## 2017-08-15 DIAGNOSIS — N2581 Secondary hyperparathyroidism of renal origin: Secondary | ICD-10-CM | POA: Diagnosis not present

## 2017-08-17 DIAGNOSIS — N186 End stage renal disease: Secondary | ICD-10-CM | POA: Diagnosis not present

## 2017-08-17 DIAGNOSIS — N2581 Secondary hyperparathyroidism of renal origin: Secondary | ICD-10-CM | POA: Diagnosis not present

## 2017-08-17 DIAGNOSIS — E119 Type 2 diabetes mellitus without complications: Secondary | ICD-10-CM | POA: Diagnosis not present

## 2017-08-20 DIAGNOSIS — N186 End stage renal disease: Secondary | ICD-10-CM | POA: Diagnosis not present

## 2017-08-20 DIAGNOSIS — E119 Type 2 diabetes mellitus without complications: Secondary | ICD-10-CM | POA: Diagnosis not present

## 2017-08-20 DIAGNOSIS — N2581 Secondary hyperparathyroidism of renal origin: Secondary | ICD-10-CM | POA: Diagnosis not present

## 2017-08-22 DIAGNOSIS — E119 Type 2 diabetes mellitus without complications: Secondary | ICD-10-CM | POA: Diagnosis not present

## 2017-08-22 DIAGNOSIS — N186 End stage renal disease: Secondary | ICD-10-CM | POA: Diagnosis not present

## 2017-08-22 DIAGNOSIS — N2581 Secondary hyperparathyroidism of renal origin: Secondary | ICD-10-CM | POA: Diagnosis not present

## 2017-08-24 DIAGNOSIS — E119 Type 2 diabetes mellitus without complications: Secondary | ICD-10-CM | POA: Diagnosis not present

## 2017-08-24 DIAGNOSIS — N186 End stage renal disease: Secondary | ICD-10-CM | POA: Diagnosis not present

## 2017-08-24 DIAGNOSIS — N2581 Secondary hyperparathyroidism of renal origin: Secondary | ICD-10-CM | POA: Diagnosis not present

## 2017-08-27 DIAGNOSIS — N186 End stage renal disease: Secondary | ICD-10-CM | POA: Diagnosis not present

## 2017-08-27 DIAGNOSIS — E119 Type 2 diabetes mellitus without complications: Secondary | ICD-10-CM | POA: Diagnosis not present

## 2017-08-27 DIAGNOSIS — N2581 Secondary hyperparathyroidism of renal origin: Secondary | ICD-10-CM | POA: Diagnosis not present

## 2017-08-29 DIAGNOSIS — E119 Type 2 diabetes mellitus without complications: Secondary | ICD-10-CM | POA: Diagnosis not present

## 2017-08-29 DIAGNOSIS — N2581 Secondary hyperparathyroidism of renal origin: Secondary | ICD-10-CM | POA: Diagnosis not present

## 2017-08-29 DIAGNOSIS — N186 End stage renal disease: Secondary | ICD-10-CM | POA: Diagnosis not present

## 2017-08-31 DIAGNOSIS — N2581 Secondary hyperparathyroidism of renal origin: Secondary | ICD-10-CM | POA: Diagnosis not present

## 2017-08-31 DIAGNOSIS — N186 End stage renal disease: Secondary | ICD-10-CM | POA: Diagnosis not present

## 2017-08-31 DIAGNOSIS — E119 Type 2 diabetes mellitus without complications: Secondary | ICD-10-CM | POA: Diagnosis not present

## 2017-09-03 DIAGNOSIS — N186 End stage renal disease: Secondary | ICD-10-CM | POA: Diagnosis not present

## 2017-09-03 DIAGNOSIS — E119 Type 2 diabetes mellitus without complications: Secondary | ICD-10-CM | POA: Diagnosis not present

## 2017-09-03 DIAGNOSIS — N2581 Secondary hyperparathyroidism of renal origin: Secondary | ICD-10-CM | POA: Diagnosis not present

## 2017-09-05 DIAGNOSIS — N186 End stage renal disease: Secondary | ICD-10-CM | POA: Diagnosis not present

## 2017-09-05 DIAGNOSIS — E119 Type 2 diabetes mellitus without complications: Secondary | ICD-10-CM | POA: Diagnosis not present

## 2017-09-05 DIAGNOSIS — N2581 Secondary hyperparathyroidism of renal origin: Secondary | ICD-10-CM | POA: Diagnosis not present

## 2017-09-07 DIAGNOSIS — N186 End stage renal disease: Secondary | ICD-10-CM | POA: Diagnosis not present

## 2017-09-07 DIAGNOSIS — N2581 Secondary hyperparathyroidism of renal origin: Secondary | ICD-10-CM | POA: Diagnosis not present

## 2017-09-07 DIAGNOSIS — E119 Type 2 diabetes mellitus without complications: Secondary | ICD-10-CM | POA: Diagnosis not present

## 2017-09-08 DIAGNOSIS — T8612 Kidney transplant failure: Secondary | ICD-10-CM | POA: Diagnosis not present

## 2017-09-08 DIAGNOSIS — Z992 Dependence on renal dialysis: Secondary | ICD-10-CM | POA: Diagnosis not present

## 2017-09-08 DIAGNOSIS — N186 End stage renal disease: Secondary | ICD-10-CM | POA: Diagnosis not present

## 2017-09-10 DIAGNOSIS — N2581 Secondary hyperparathyroidism of renal origin: Secondary | ICD-10-CM | POA: Diagnosis not present

## 2017-09-10 DIAGNOSIS — N186 End stage renal disease: Secondary | ICD-10-CM | POA: Diagnosis not present

## 2017-09-10 DIAGNOSIS — E119 Type 2 diabetes mellitus without complications: Secondary | ICD-10-CM | POA: Diagnosis not present

## 2017-09-10 DIAGNOSIS — Z23 Encounter for immunization: Secondary | ICD-10-CM | POA: Diagnosis not present

## 2017-09-12 DIAGNOSIS — Z23 Encounter for immunization: Secondary | ICD-10-CM | POA: Diagnosis not present

## 2017-09-12 DIAGNOSIS — E119 Type 2 diabetes mellitus without complications: Secondary | ICD-10-CM | POA: Diagnosis not present

## 2017-09-12 DIAGNOSIS — N186 End stage renal disease: Secondary | ICD-10-CM | POA: Diagnosis not present

## 2017-09-12 DIAGNOSIS — N2581 Secondary hyperparathyroidism of renal origin: Secondary | ICD-10-CM | POA: Diagnosis not present

## 2017-09-14 DIAGNOSIS — N2581 Secondary hyperparathyroidism of renal origin: Secondary | ICD-10-CM | POA: Diagnosis not present

## 2017-09-14 DIAGNOSIS — E119 Type 2 diabetes mellitus without complications: Secondary | ICD-10-CM | POA: Diagnosis not present

## 2017-09-14 DIAGNOSIS — Z23 Encounter for immunization: Secondary | ICD-10-CM | POA: Diagnosis not present

## 2017-09-14 DIAGNOSIS — N186 End stage renal disease: Secondary | ICD-10-CM | POA: Diagnosis not present

## 2017-09-17 DIAGNOSIS — N2581 Secondary hyperparathyroidism of renal origin: Secondary | ICD-10-CM | POA: Diagnosis not present

## 2017-09-17 DIAGNOSIS — E119 Type 2 diabetes mellitus without complications: Secondary | ICD-10-CM | POA: Diagnosis not present

## 2017-09-17 DIAGNOSIS — N186 End stage renal disease: Secondary | ICD-10-CM | POA: Diagnosis not present

## 2017-09-17 DIAGNOSIS — Z23 Encounter for immunization: Secondary | ICD-10-CM | POA: Diagnosis not present

## 2017-09-19 DIAGNOSIS — Z23 Encounter for immunization: Secondary | ICD-10-CM | POA: Diagnosis not present

## 2017-09-19 DIAGNOSIS — N186 End stage renal disease: Secondary | ICD-10-CM | POA: Diagnosis not present

## 2017-09-19 DIAGNOSIS — E119 Type 2 diabetes mellitus without complications: Secondary | ICD-10-CM | POA: Diagnosis not present

## 2017-09-19 DIAGNOSIS — N2581 Secondary hyperparathyroidism of renal origin: Secondary | ICD-10-CM | POA: Diagnosis not present

## 2017-09-21 DIAGNOSIS — N186 End stage renal disease: Secondary | ICD-10-CM | POA: Diagnosis not present

## 2017-09-21 DIAGNOSIS — E119 Type 2 diabetes mellitus without complications: Secondary | ICD-10-CM | POA: Diagnosis not present

## 2017-09-21 DIAGNOSIS — N2581 Secondary hyperparathyroidism of renal origin: Secondary | ICD-10-CM | POA: Diagnosis not present

## 2017-09-21 DIAGNOSIS — Z23 Encounter for immunization: Secondary | ICD-10-CM | POA: Diagnosis not present

## 2017-09-24 DIAGNOSIS — N186 End stage renal disease: Secondary | ICD-10-CM | POA: Diagnosis not present

## 2017-09-24 DIAGNOSIS — Z23 Encounter for immunization: Secondary | ICD-10-CM | POA: Diagnosis not present

## 2017-09-24 DIAGNOSIS — E119 Type 2 diabetes mellitus without complications: Secondary | ICD-10-CM | POA: Diagnosis not present

## 2017-09-24 DIAGNOSIS — N2581 Secondary hyperparathyroidism of renal origin: Secondary | ICD-10-CM | POA: Diagnosis not present

## 2017-09-26 DIAGNOSIS — N186 End stage renal disease: Secondary | ICD-10-CM | POA: Diagnosis not present

## 2017-09-26 DIAGNOSIS — N2581 Secondary hyperparathyroidism of renal origin: Secondary | ICD-10-CM | POA: Diagnosis not present

## 2017-09-26 DIAGNOSIS — E119 Type 2 diabetes mellitus without complications: Secondary | ICD-10-CM | POA: Diagnosis not present

## 2017-09-26 DIAGNOSIS — Z23 Encounter for immunization: Secondary | ICD-10-CM | POA: Diagnosis not present

## 2017-09-28 DIAGNOSIS — Z23 Encounter for immunization: Secondary | ICD-10-CM | POA: Diagnosis not present

## 2017-09-28 DIAGNOSIS — N186 End stage renal disease: Secondary | ICD-10-CM | POA: Diagnosis not present

## 2017-09-28 DIAGNOSIS — E119 Type 2 diabetes mellitus without complications: Secondary | ICD-10-CM | POA: Diagnosis not present

## 2017-09-28 DIAGNOSIS — N2581 Secondary hyperparathyroidism of renal origin: Secondary | ICD-10-CM | POA: Diagnosis not present

## 2017-10-01 DIAGNOSIS — Z23 Encounter for immunization: Secondary | ICD-10-CM | POA: Diagnosis not present

## 2017-10-01 DIAGNOSIS — N2581 Secondary hyperparathyroidism of renal origin: Secondary | ICD-10-CM | POA: Diagnosis not present

## 2017-10-01 DIAGNOSIS — N186 End stage renal disease: Secondary | ICD-10-CM | POA: Diagnosis not present

## 2017-10-01 DIAGNOSIS — E119 Type 2 diabetes mellitus without complications: Secondary | ICD-10-CM | POA: Diagnosis not present

## 2017-10-03 DIAGNOSIS — Z23 Encounter for immunization: Secondary | ICD-10-CM | POA: Diagnosis not present

## 2017-10-03 DIAGNOSIS — N186 End stage renal disease: Secondary | ICD-10-CM | POA: Diagnosis not present

## 2017-10-03 DIAGNOSIS — N2581 Secondary hyperparathyroidism of renal origin: Secondary | ICD-10-CM | POA: Diagnosis not present

## 2017-10-03 DIAGNOSIS — E119 Type 2 diabetes mellitus without complications: Secondary | ICD-10-CM | POA: Diagnosis not present

## 2017-10-05 DIAGNOSIS — N186 End stage renal disease: Secondary | ICD-10-CM | POA: Diagnosis not present

## 2017-10-05 DIAGNOSIS — E119 Type 2 diabetes mellitus without complications: Secondary | ICD-10-CM | POA: Diagnosis not present

## 2017-10-05 DIAGNOSIS — Z23 Encounter for immunization: Secondary | ICD-10-CM | POA: Diagnosis not present

## 2017-10-05 DIAGNOSIS — N2581 Secondary hyperparathyroidism of renal origin: Secondary | ICD-10-CM | POA: Diagnosis not present

## 2017-10-08 DIAGNOSIS — E119 Type 2 diabetes mellitus without complications: Secondary | ICD-10-CM | POA: Diagnosis not present

## 2017-10-08 DIAGNOSIS — N2581 Secondary hyperparathyroidism of renal origin: Secondary | ICD-10-CM | POA: Diagnosis not present

## 2017-10-08 DIAGNOSIS — N186 End stage renal disease: Secondary | ICD-10-CM | POA: Diagnosis not present

## 2017-10-08 DIAGNOSIS — Z992 Dependence on renal dialysis: Secondary | ICD-10-CM | POA: Diagnosis not present

## 2017-10-08 DIAGNOSIS — T8612 Kidney transplant failure: Secondary | ICD-10-CM | POA: Diagnosis not present

## 2017-10-10 DIAGNOSIS — N186 End stage renal disease: Secondary | ICD-10-CM | POA: Diagnosis not present

## 2017-10-10 DIAGNOSIS — E119 Type 2 diabetes mellitus without complications: Secondary | ICD-10-CM | POA: Diagnosis not present

## 2017-10-10 DIAGNOSIS — N2581 Secondary hyperparathyroidism of renal origin: Secondary | ICD-10-CM | POA: Diagnosis not present

## 2017-10-12 DIAGNOSIS — E119 Type 2 diabetes mellitus without complications: Secondary | ICD-10-CM | POA: Diagnosis not present

## 2017-10-12 DIAGNOSIS — N2581 Secondary hyperparathyroidism of renal origin: Secondary | ICD-10-CM | POA: Diagnosis not present

## 2017-10-12 DIAGNOSIS — N186 End stage renal disease: Secondary | ICD-10-CM | POA: Diagnosis not present

## 2017-10-15 DIAGNOSIS — N2581 Secondary hyperparathyroidism of renal origin: Secondary | ICD-10-CM | POA: Diagnosis not present

## 2017-10-15 DIAGNOSIS — E119 Type 2 diabetes mellitus without complications: Secondary | ICD-10-CM | POA: Diagnosis not present

## 2017-10-15 DIAGNOSIS — N186 End stage renal disease: Secondary | ICD-10-CM | POA: Diagnosis not present

## 2017-10-17 DIAGNOSIS — N186 End stage renal disease: Secondary | ICD-10-CM | POA: Diagnosis not present

## 2017-10-17 DIAGNOSIS — E119 Type 2 diabetes mellitus without complications: Secondary | ICD-10-CM | POA: Diagnosis not present

## 2017-10-17 DIAGNOSIS — N2581 Secondary hyperparathyroidism of renal origin: Secondary | ICD-10-CM | POA: Diagnosis not present

## 2017-10-19 DIAGNOSIS — E119 Type 2 diabetes mellitus without complications: Secondary | ICD-10-CM | POA: Diagnosis not present

## 2017-10-19 DIAGNOSIS — N186 End stage renal disease: Secondary | ICD-10-CM | POA: Diagnosis not present

## 2017-10-19 DIAGNOSIS — N2581 Secondary hyperparathyroidism of renal origin: Secondary | ICD-10-CM | POA: Diagnosis not present

## 2017-10-22 DIAGNOSIS — N186 End stage renal disease: Secondary | ICD-10-CM | POA: Diagnosis not present

## 2017-10-22 DIAGNOSIS — N2581 Secondary hyperparathyroidism of renal origin: Secondary | ICD-10-CM | POA: Diagnosis not present

## 2017-10-22 DIAGNOSIS — E119 Type 2 diabetes mellitus without complications: Secondary | ICD-10-CM | POA: Diagnosis not present

## 2017-10-24 DIAGNOSIS — N2581 Secondary hyperparathyroidism of renal origin: Secondary | ICD-10-CM | POA: Diagnosis not present

## 2017-10-24 DIAGNOSIS — E119 Type 2 diabetes mellitus without complications: Secondary | ICD-10-CM | POA: Diagnosis not present

## 2017-10-24 DIAGNOSIS — N186 End stage renal disease: Secondary | ICD-10-CM | POA: Diagnosis not present

## 2017-10-26 DIAGNOSIS — N186 End stage renal disease: Secondary | ICD-10-CM | POA: Diagnosis not present

## 2017-10-26 DIAGNOSIS — N2581 Secondary hyperparathyroidism of renal origin: Secondary | ICD-10-CM | POA: Diagnosis not present

## 2017-10-26 DIAGNOSIS — E119 Type 2 diabetes mellitus without complications: Secondary | ICD-10-CM | POA: Diagnosis not present

## 2017-10-28 DIAGNOSIS — N186 End stage renal disease: Secondary | ICD-10-CM | POA: Diagnosis not present

## 2017-10-28 DIAGNOSIS — N2581 Secondary hyperparathyroidism of renal origin: Secondary | ICD-10-CM | POA: Diagnosis not present

## 2017-10-28 DIAGNOSIS — E119 Type 2 diabetes mellitus without complications: Secondary | ICD-10-CM | POA: Diagnosis not present

## 2017-10-31 DIAGNOSIS — N2581 Secondary hyperparathyroidism of renal origin: Secondary | ICD-10-CM | POA: Diagnosis not present

## 2017-10-31 DIAGNOSIS — N186 End stage renal disease: Secondary | ICD-10-CM | POA: Diagnosis not present

## 2017-10-31 DIAGNOSIS — E119 Type 2 diabetes mellitus without complications: Secondary | ICD-10-CM | POA: Diagnosis not present

## 2017-11-02 DIAGNOSIS — N186 End stage renal disease: Secondary | ICD-10-CM | POA: Diagnosis not present

## 2017-11-02 DIAGNOSIS — E119 Type 2 diabetes mellitus without complications: Secondary | ICD-10-CM | POA: Diagnosis not present

## 2017-11-02 DIAGNOSIS — N2581 Secondary hyperparathyroidism of renal origin: Secondary | ICD-10-CM | POA: Diagnosis not present

## 2017-11-05 DIAGNOSIS — N186 End stage renal disease: Secondary | ICD-10-CM | POA: Diagnosis not present

## 2017-11-05 DIAGNOSIS — E119 Type 2 diabetes mellitus without complications: Secondary | ICD-10-CM | POA: Diagnosis not present

## 2017-11-05 DIAGNOSIS — N2581 Secondary hyperparathyroidism of renal origin: Secondary | ICD-10-CM | POA: Diagnosis not present

## 2017-11-07 DIAGNOSIS — N186 End stage renal disease: Secondary | ICD-10-CM | POA: Diagnosis not present

## 2017-11-07 DIAGNOSIS — E119 Type 2 diabetes mellitus without complications: Secondary | ICD-10-CM | POA: Diagnosis not present

## 2017-11-07 DIAGNOSIS — N2581 Secondary hyperparathyroidism of renal origin: Secondary | ICD-10-CM | POA: Diagnosis not present

## 2017-11-08 DIAGNOSIS — T8612 Kidney transplant failure: Secondary | ICD-10-CM | POA: Diagnosis not present

## 2017-11-08 DIAGNOSIS — Z992 Dependence on renal dialysis: Secondary | ICD-10-CM | POA: Diagnosis not present

## 2017-11-08 DIAGNOSIS — N186 End stage renal disease: Secondary | ICD-10-CM | POA: Diagnosis not present

## 2017-11-09 DIAGNOSIS — N2581 Secondary hyperparathyroidism of renal origin: Secondary | ICD-10-CM | POA: Diagnosis not present

## 2017-11-09 DIAGNOSIS — N186 End stage renal disease: Secondary | ICD-10-CM | POA: Diagnosis not present

## 2017-11-09 DIAGNOSIS — E119 Type 2 diabetes mellitus without complications: Secondary | ICD-10-CM | POA: Diagnosis not present

## 2017-11-12 DIAGNOSIS — N2581 Secondary hyperparathyroidism of renal origin: Secondary | ICD-10-CM | POA: Diagnosis not present

## 2017-11-12 DIAGNOSIS — E119 Type 2 diabetes mellitus without complications: Secondary | ICD-10-CM | POA: Diagnosis not present

## 2017-11-12 DIAGNOSIS — N186 End stage renal disease: Secondary | ICD-10-CM | POA: Diagnosis not present

## 2017-11-14 DIAGNOSIS — E119 Type 2 diabetes mellitus without complications: Secondary | ICD-10-CM | POA: Diagnosis not present

## 2017-11-14 DIAGNOSIS — N186 End stage renal disease: Secondary | ICD-10-CM | POA: Diagnosis not present

## 2017-11-14 DIAGNOSIS — N2581 Secondary hyperparathyroidism of renal origin: Secondary | ICD-10-CM | POA: Diagnosis not present

## 2017-11-16 DIAGNOSIS — N2581 Secondary hyperparathyroidism of renal origin: Secondary | ICD-10-CM | POA: Diagnosis not present

## 2017-11-16 DIAGNOSIS — N186 End stage renal disease: Secondary | ICD-10-CM | POA: Diagnosis not present

## 2017-11-16 DIAGNOSIS — E119 Type 2 diabetes mellitus without complications: Secondary | ICD-10-CM | POA: Diagnosis not present

## 2017-11-19 DIAGNOSIS — N186 End stage renal disease: Secondary | ICD-10-CM | POA: Diagnosis not present

## 2017-11-19 DIAGNOSIS — E119 Type 2 diabetes mellitus without complications: Secondary | ICD-10-CM | POA: Diagnosis not present

## 2017-11-19 DIAGNOSIS — N2581 Secondary hyperparathyroidism of renal origin: Secondary | ICD-10-CM | POA: Diagnosis not present

## 2017-11-21 DIAGNOSIS — E119 Type 2 diabetes mellitus without complications: Secondary | ICD-10-CM | POA: Diagnosis not present

## 2017-11-21 DIAGNOSIS — N186 End stage renal disease: Secondary | ICD-10-CM | POA: Diagnosis not present

## 2017-11-21 DIAGNOSIS — N2581 Secondary hyperparathyroidism of renal origin: Secondary | ICD-10-CM | POA: Diagnosis not present

## 2017-11-23 DIAGNOSIS — N2581 Secondary hyperparathyroidism of renal origin: Secondary | ICD-10-CM | POA: Diagnosis not present

## 2017-11-23 DIAGNOSIS — N186 End stage renal disease: Secondary | ICD-10-CM | POA: Diagnosis not present

## 2017-11-23 DIAGNOSIS — E119 Type 2 diabetes mellitus without complications: Secondary | ICD-10-CM | POA: Diagnosis not present

## 2017-11-26 DIAGNOSIS — N2581 Secondary hyperparathyroidism of renal origin: Secondary | ICD-10-CM | POA: Diagnosis not present

## 2017-11-26 DIAGNOSIS — E119 Type 2 diabetes mellitus without complications: Secondary | ICD-10-CM | POA: Diagnosis not present

## 2017-11-26 DIAGNOSIS — N186 End stage renal disease: Secondary | ICD-10-CM | POA: Diagnosis not present

## 2017-11-28 DIAGNOSIS — N186 End stage renal disease: Secondary | ICD-10-CM | POA: Diagnosis not present

## 2017-11-28 DIAGNOSIS — N2581 Secondary hyperparathyroidism of renal origin: Secondary | ICD-10-CM | POA: Diagnosis not present

## 2017-11-28 DIAGNOSIS — E119 Type 2 diabetes mellitus without complications: Secondary | ICD-10-CM | POA: Diagnosis not present

## 2017-11-30 DIAGNOSIS — N2581 Secondary hyperparathyroidism of renal origin: Secondary | ICD-10-CM | POA: Diagnosis not present

## 2017-11-30 DIAGNOSIS — N186 End stage renal disease: Secondary | ICD-10-CM | POA: Diagnosis not present

## 2017-11-30 DIAGNOSIS — E119 Type 2 diabetes mellitus without complications: Secondary | ICD-10-CM | POA: Diagnosis not present

## 2017-12-02 DIAGNOSIS — N2581 Secondary hyperparathyroidism of renal origin: Secondary | ICD-10-CM | POA: Diagnosis not present

## 2017-12-02 DIAGNOSIS — N186 End stage renal disease: Secondary | ICD-10-CM | POA: Diagnosis not present

## 2017-12-02 DIAGNOSIS — E119 Type 2 diabetes mellitus without complications: Secondary | ICD-10-CM | POA: Diagnosis not present

## 2017-12-04 DIAGNOSIS — N186 End stage renal disease: Secondary | ICD-10-CM | POA: Diagnosis not present

## 2017-12-04 DIAGNOSIS — E119 Type 2 diabetes mellitus without complications: Secondary | ICD-10-CM | POA: Diagnosis not present

## 2017-12-04 DIAGNOSIS — N2581 Secondary hyperparathyroidism of renal origin: Secondary | ICD-10-CM | POA: Diagnosis not present

## 2017-12-07 DIAGNOSIS — E119 Type 2 diabetes mellitus without complications: Secondary | ICD-10-CM | POA: Diagnosis not present

## 2017-12-07 DIAGNOSIS — N2581 Secondary hyperparathyroidism of renal origin: Secondary | ICD-10-CM | POA: Diagnosis not present

## 2017-12-07 DIAGNOSIS — N186 End stage renal disease: Secondary | ICD-10-CM | POA: Diagnosis not present

## 2017-12-08 DIAGNOSIS — N186 End stage renal disease: Secondary | ICD-10-CM | POA: Diagnosis not present

## 2017-12-08 DIAGNOSIS — T8612 Kidney transplant failure: Secondary | ICD-10-CM | POA: Diagnosis not present

## 2017-12-08 DIAGNOSIS — Z992 Dependence on renal dialysis: Secondary | ICD-10-CM | POA: Diagnosis not present

## 2017-12-10 DIAGNOSIS — N186 End stage renal disease: Secondary | ICD-10-CM | POA: Diagnosis not present

## 2017-12-10 DIAGNOSIS — N2581 Secondary hyperparathyroidism of renal origin: Secondary | ICD-10-CM | POA: Diagnosis not present

## 2017-12-10 DIAGNOSIS — E119 Type 2 diabetes mellitus without complications: Secondary | ICD-10-CM | POA: Diagnosis not present

## 2017-12-12 DIAGNOSIS — N186 End stage renal disease: Secondary | ICD-10-CM | POA: Diagnosis not present

## 2017-12-12 DIAGNOSIS — E119 Type 2 diabetes mellitus without complications: Secondary | ICD-10-CM | POA: Diagnosis not present

## 2017-12-12 DIAGNOSIS — N2581 Secondary hyperparathyroidism of renal origin: Secondary | ICD-10-CM | POA: Diagnosis not present

## 2017-12-14 DIAGNOSIS — E119 Type 2 diabetes mellitus without complications: Secondary | ICD-10-CM | POA: Diagnosis not present

## 2017-12-14 DIAGNOSIS — N186 End stage renal disease: Secondary | ICD-10-CM | POA: Diagnosis not present

## 2017-12-14 DIAGNOSIS — N2581 Secondary hyperparathyroidism of renal origin: Secondary | ICD-10-CM | POA: Diagnosis not present

## 2017-12-17 DIAGNOSIS — N186 End stage renal disease: Secondary | ICD-10-CM | POA: Diagnosis not present

## 2017-12-17 DIAGNOSIS — N2581 Secondary hyperparathyroidism of renal origin: Secondary | ICD-10-CM | POA: Diagnosis not present

## 2017-12-17 DIAGNOSIS — E119 Type 2 diabetes mellitus without complications: Secondary | ICD-10-CM | POA: Diagnosis not present

## 2017-12-19 DIAGNOSIS — N186 End stage renal disease: Secondary | ICD-10-CM | POA: Diagnosis not present

## 2017-12-19 DIAGNOSIS — N2581 Secondary hyperparathyroidism of renal origin: Secondary | ICD-10-CM | POA: Diagnosis not present

## 2017-12-19 DIAGNOSIS — E119 Type 2 diabetes mellitus without complications: Secondary | ICD-10-CM | POA: Diagnosis not present

## 2017-12-21 DIAGNOSIS — E119 Type 2 diabetes mellitus without complications: Secondary | ICD-10-CM | POA: Diagnosis not present

## 2017-12-21 DIAGNOSIS — N2581 Secondary hyperparathyroidism of renal origin: Secondary | ICD-10-CM | POA: Diagnosis not present

## 2017-12-21 DIAGNOSIS — N186 End stage renal disease: Secondary | ICD-10-CM | POA: Diagnosis not present

## 2017-12-24 DIAGNOSIS — N2581 Secondary hyperparathyroidism of renal origin: Secondary | ICD-10-CM | POA: Diagnosis not present

## 2017-12-24 DIAGNOSIS — E119 Type 2 diabetes mellitus without complications: Secondary | ICD-10-CM | POA: Diagnosis not present

## 2017-12-24 DIAGNOSIS — N186 End stage renal disease: Secondary | ICD-10-CM | POA: Diagnosis not present

## 2017-12-26 DIAGNOSIS — E119 Type 2 diabetes mellitus without complications: Secondary | ICD-10-CM | POA: Diagnosis not present

## 2017-12-26 DIAGNOSIS — N2581 Secondary hyperparathyroidism of renal origin: Secondary | ICD-10-CM | POA: Diagnosis not present

## 2017-12-26 DIAGNOSIS — N186 End stage renal disease: Secondary | ICD-10-CM | POA: Diagnosis not present

## 2017-12-28 DIAGNOSIS — E119 Type 2 diabetes mellitus without complications: Secondary | ICD-10-CM | POA: Diagnosis not present

## 2017-12-28 DIAGNOSIS — N2581 Secondary hyperparathyroidism of renal origin: Secondary | ICD-10-CM | POA: Diagnosis not present

## 2017-12-28 DIAGNOSIS — N186 End stage renal disease: Secondary | ICD-10-CM | POA: Diagnosis not present

## 2017-12-30 DIAGNOSIS — N186 End stage renal disease: Secondary | ICD-10-CM | POA: Diagnosis not present

## 2017-12-30 DIAGNOSIS — Z992 Dependence on renal dialysis: Secondary | ICD-10-CM | POA: Diagnosis not present

## 2017-12-30 DIAGNOSIS — T8610 Unspecified complication of kidney transplant: Secondary | ICD-10-CM | POA: Diagnosis not present

## 2017-12-31 DIAGNOSIS — N2581 Secondary hyperparathyroidism of renal origin: Secondary | ICD-10-CM | POA: Diagnosis not present

## 2017-12-31 DIAGNOSIS — N186 End stage renal disease: Secondary | ICD-10-CM | POA: Diagnosis not present

## 2018-01-02 DIAGNOSIS — N186 End stage renal disease: Secondary | ICD-10-CM | POA: Diagnosis not present

## 2018-01-02 DIAGNOSIS — N2581 Secondary hyperparathyroidism of renal origin: Secondary | ICD-10-CM | POA: Diagnosis not present

## 2018-01-04 DIAGNOSIS — N2581 Secondary hyperparathyroidism of renal origin: Secondary | ICD-10-CM | POA: Diagnosis not present

## 2018-01-04 DIAGNOSIS — N186 End stage renal disease: Secondary | ICD-10-CM | POA: Diagnosis not present

## 2018-01-06 DIAGNOSIS — N186 End stage renal disease: Secondary | ICD-10-CM | POA: Diagnosis not present

## 2018-01-06 DIAGNOSIS — E119 Type 2 diabetes mellitus without complications: Secondary | ICD-10-CM | POA: Diagnosis not present

## 2018-01-06 DIAGNOSIS — N2581 Secondary hyperparathyroidism of renal origin: Secondary | ICD-10-CM | POA: Diagnosis not present

## 2018-01-08 DIAGNOSIS — Z992 Dependence on renal dialysis: Secondary | ICD-10-CM | POA: Diagnosis not present

## 2018-01-08 DIAGNOSIS — N186 End stage renal disease: Secondary | ICD-10-CM | POA: Diagnosis not present

## 2018-01-08 DIAGNOSIS — T8612 Kidney transplant failure: Secondary | ICD-10-CM | POA: Diagnosis not present

## 2018-01-09 DIAGNOSIS — N2581 Secondary hyperparathyroidism of renal origin: Secondary | ICD-10-CM | POA: Diagnosis not present

## 2018-01-09 DIAGNOSIS — E119 Type 2 diabetes mellitus without complications: Secondary | ICD-10-CM | POA: Diagnosis not present

## 2018-01-09 DIAGNOSIS — N186 End stage renal disease: Secondary | ICD-10-CM | POA: Diagnosis not present

## 2018-01-11 DIAGNOSIS — N186 End stage renal disease: Secondary | ICD-10-CM | POA: Diagnosis not present

## 2018-01-11 DIAGNOSIS — N2581 Secondary hyperparathyroidism of renal origin: Secondary | ICD-10-CM | POA: Diagnosis not present

## 2018-01-11 DIAGNOSIS — E119 Type 2 diabetes mellitus without complications: Secondary | ICD-10-CM | POA: Diagnosis not present

## 2018-01-14 DIAGNOSIS — N186 End stage renal disease: Secondary | ICD-10-CM | POA: Diagnosis not present

## 2018-01-14 DIAGNOSIS — N2581 Secondary hyperparathyroidism of renal origin: Secondary | ICD-10-CM | POA: Diagnosis not present

## 2018-01-14 DIAGNOSIS — E119 Type 2 diabetes mellitus without complications: Secondary | ICD-10-CM | POA: Diagnosis not present

## 2018-01-16 DIAGNOSIS — E119 Type 2 diabetes mellitus without complications: Secondary | ICD-10-CM | POA: Diagnosis not present

## 2018-01-16 DIAGNOSIS — N2581 Secondary hyperparathyroidism of renal origin: Secondary | ICD-10-CM | POA: Diagnosis not present

## 2018-01-16 DIAGNOSIS — N186 End stage renal disease: Secondary | ICD-10-CM | POA: Diagnosis not present

## 2018-01-18 DIAGNOSIS — N186 End stage renal disease: Secondary | ICD-10-CM | POA: Diagnosis not present

## 2018-01-18 DIAGNOSIS — E119 Type 2 diabetes mellitus without complications: Secondary | ICD-10-CM | POA: Diagnosis not present

## 2018-01-18 DIAGNOSIS — N2581 Secondary hyperparathyroidism of renal origin: Secondary | ICD-10-CM | POA: Diagnosis not present

## 2018-01-21 DIAGNOSIS — N2581 Secondary hyperparathyroidism of renal origin: Secondary | ICD-10-CM | POA: Diagnosis not present

## 2018-01-21 DIAGNOSIS — N186 End stage renal disease: Secondary | ICD-10-CM | POA: Diagnosis not present

## 2018-01-21 DIAGNOSIS — E119 Type 2 diabetes mellitus without complications: Secondary | ICD-10-CM | POA: Diagnosis not present

## 2018-01-23 DIAGNOSIS — N2581 Secondary hyperparathyroidism of renal origin: Secondary | ICD-10-CM | POA: Diagnosis not present

## 2018-01-23 DIAGNOSIS — E119 Type 2 diabetes mellitus without complications: Secondary | ICD-10-CM | POA: Diagnosis not present

## 2018-01-23 DIAGNOSIS — N186 End stage renal disease: Secondary | ICD-10-CM | POA: Diagnosis not present

## 2018-01-25 DIAGNOSIS — N2581 Secondary hyperparathyroidism of renal origin: Secondary | ICD-10-CM | POA: Diagnosis not present

## 2018-01-25 DIAGNOSIS — E119 Type 2 diabetes mellitus without complications: Secondary | ICD-10-CM | POA: Diagnosis not present

## 2018-01-25 DIAGNOSIS — N186 End stage renal disease: Secondary | ICD-10-CM | POA: Diagnosis not present

## 2018-01-28 DIAGNOSIS — N2581 Secondary hyperparathyroidism of renal origin: Secondary | ICD-10-CM | POA: Diagnosis not present

## 2018-01-28 DIAGNOSIS — N186 End stage renal disease: Secondary | ICD-10-CM | POA: Diagnosis not present

## 2018-01-28 DIAGNOSIS — E119 Type 2 diabetes mellitus without complications: Secondary | ICD-10-CM | POA: Diagnosis not present

## 2018-01-30 DIAGNOSIS — E119 Type 2 diabetes mellitus without complications: Secondary | ICD-10-CM | POA: Diagnosis not present

## 2018-01-30 DIAGNOSIS — N2581 Secondary hyperparathyroidism of renal origin: Secondary | ICD-10-CM | POA: Diagnosis not present

## 2018-01-30 DIAGNOSIS — N186 End stage renal disease: Secondary | ICD-10-CM | POA: Diagnosis not present

## 2018-02-01 DIAGNOSIS — N2581 Secondary hyperparathyroidism of renal origin: Secondary | ICD-10-CM | POA: Diagnosis not present

## 2018-02-01 DIAGNOSIS — N186 End stage renal disease: Secondary | ICD-10-CM | POA: Diagnosis not present

## 2018-02-01 DIAGNOSIS — E119 Type 2 diabetes mellitus without complications: Secondary | ICD-10-CM | POA: Diagnosis not present

## 2018-02-04 DIAGNOSIS — N186 End stage renal disease: Secondary | ICD-10-CM | POA: Diagnosis not present

## 2018-02-04 DIAGNOSIS — N2581 Secondary hyperparathyroidism of renal origin: Secondary | ICD-10-CM | POA: Diagnosis not present

## 2018-02-04 DIAGNOSIS — E119 Type 2 diabetes mellitus without complications: Secondary | ICD-10-CM | POA: Diagnosis not present

## 2018-02-06 DIAGNOSIS — N2581 Secondary hyperparathyroidism of renal origin: Secondary | ICD-10-CM | POA: Diagnosis not present

## 2018-02-06 DIAGNOSIS — N186 End stage renal disease: Secondary | ICD-10-CM | POA: Diagnosis not present

## 2018-02-06 DIAGNOSIS — E119 Type 2 diabetes mellitus without complications: Secondary | ICD-10-CM | POA: Diagnosis not present

## 2018-02-08 DIAGNOSIS — Z992 Dependence on renal dialysis: Secondary | ICD-10-CM | POA: Diagnosis not present

## 2018-02-08 DIAGNOSIS — T8612 Kidney transplant failure: Secondary | ICD-10-CM | POA: Diagnosis not present

## 2018-02-08 DIAGNOSIS — E119 Type 2 diabetes mellitus without complications: Secondary | ICD-10-CM | POA: Diagnosis not present

## 2018-02-08 DIAGNOSIS — N2581 Secondary hyperparathyroidism of renal origin: Secondary | ICD-10-CM | POA: Diagnosis not present

## 2018-02-08 DIAGNOSIS — N186 End stage renal disease: Secondary | ICD-10-CM | POA: Diagnosis not present

## 2018-02-11 DIAGNOSIS — N186 End stage renal disease: Secondary | ICD-10-CM | POA: Diagnosis not present

## 2018-02-11 DIAGNOSIS — N2581 Secondary hyperparathyroidism of renal origin: Secondary | ICD-10-CM | POA: Diagnosis not present

## 2018-02-11 DIAGNOSIS — E119 Type 2 diabetes mellitus without complications: Secondary | ICD-10-CM | POA: Diagnosis not present

## 2018-02-13 DIAGNOSIS — N2581 Secondary hyperparathyroidism of renal origin: Secondary | ICD-10-CM | POA: Diagnosis not present

## 2018-02-13 DIAGNOSIS — N186 End stage renal disease: Secondary | ICD-10-CM | POA: Diagnosis not present

## 2018-02-13 DIAGNOSIS — E119 Type 2 diabetes mellitus without complications: Secondary | ICD-10-CM | POA: Diagnosis not present

## 2018-02-15 DIAGNOSIS — N186 End stage renal disease: Secondary | ICD-10-CM | POA: Diagnosis not present

## 2018-02-15 DIAGNOSIS — E119 Type 2 diabetes mellitus without complications: Secondary | ICD-10-CM | POA: Diagnosis not present

## 2018-02-15 DIAGNOSIS — N2581 Secondary hyperparathyroidism of renal origin: Secondary | ICD-10-CM | POA: Diagnosis not present

## 2018-02-18 DIAGNOSIS — N186 End stage renal disease: Secondary | ICD-10-CM | POA: Diagnosis not present

## 2018-02-18 DIAGNOSIS — E119 Type 2 diabetes mellitus without complications: Secondary | ICD-10-CM | POA: Diagnosis not present

## 2018-02-18 DIAGNOSIS — N2581 Secondary hyperparathyroidism of renal origin: Secondary | ICD-10-CM | POA: Diagnosis not present

## 2018-02-20 DIAGNOSIS — N2581 Secondary hyperparathyroidism of renal origin: Secondary | ICD-10-CM | POA: Diagnosis not present

## 2018-02-20 DIAGNOSIS — N186 End stage renal disease: Secondary | ICD-10-CM | POA: Diagnosis not present

## 2018-02-20 DIAGNOSIS — E119 Type 2 diabetes mellitus without complications: Secondary | ICD-10-CM | POA: Diagnosis not present

## 2018-02-22 DIAGNOSIS — N2581 Secondary hyperparathyroidism of renal origin: Secondary | ICD-10-CM | POA: Diagnosis not present

## 2018-02-22 DIAGNOSIS — E119 Type 2 diabetes mellitus without complications: Secondary | ICD-10-CM | POA: Diagnosis not present

## 2018-02-22 DIAGNOSIS — N186 End stage renal disease: Secondary | ICD-10-CM | POA: Diagnosis not present

## 2018-02-25 DIAGNOSIS — N2581 Secondary hyperparathyroidism of renal origin: Secondary | ICD-10-CM | POA: Diagnosis not present

## 2018-02-25 DIAGNOSIS — E119 Type 2 diabetes mellitus without complications: Secondary | ICD-10-CM | POA: Diagnosis not present

## 2018-02-25 DIAGNOSIS — N186 End stage renal disease: Secondary | ICD-10-CM | POA: Diagnosis not present

## 2018-02-27 DIAGNOSIS — N186 End stage renal disease: Secondary | ICD-10-CM | POA: Diagnosis not present

## 2018-02-27 DIAGNOSIS — N2581 Secondary hyperparathyroidism of renal origin: Secondary | ICD-10-CM | POA: Diagnosis not present

## 2018-02-27 DIAGNOSIS — E119 Type 2 diabetes mellitus without complications: Secondary | ICD-10-CM | POA: Diagnosis not present

## 2018-03-01 DIAGNOSIS — N186 End stage renal disease: Secondary | ICD-10-CM | POA: Diagnosis not present

## 2018-03-01 DIAGNOSIS — N2581 Secondary hyperparathyroidism of renal origin: Secondary | ICD-10-CM | POA: Diagnosis not present

## 2018-03-01 DIAGNOSIS — E119 Type 2 diabetes mellitus without complications: Secondary | ICD-10-CM | POA: Diagnosis not present

## 2018-03-03 ENCOUNTER — Emergency Department (HOSPITAL_BASED_OUTPATIENT_CLINIC_OR_DEPARTMENT_OTHER)
Admission: EM | Admit: 2018-03-03 | Discharge: 2018-03-04 | Discharge status: Against Medical Advice | Payer: Medicare Other | Attending: Emergency Medicine | Admitting: Emergency Medicine

## 2018-03-03 ENCOUNTER — Other Ambulatory Visit: Payer: Self-pay

## 2018-03-03 ENCOUNTER — Emergency Department (HOSPITAL_BASED_OUTPATIENT_CLINIC_OR_DEPARTMENT_OTHER): Payer: Medicare Other

## 2018-03-03 ENCOUNTER — Encounter (HOSPITAL_BASED_OUTPATIENT_CLINIC_OR_DEPARTMENT_OTHER): Payer: Self-pay | Admitting: *Deleted

## 2018-03-03 DIAGNOSIS — Z992 Dependence on renal dialysis: Secondary | ICD-10-CM | POA: Diagnosis not present

## 2018-03-03 DIAGNOSIS — Z87891 Personal history of nicotine dependence: Secondary | ICD-10-CM | POA: Diagnosis not present

## 2018-03-03 DIAGNOSIS — I132 Hypertensive heart and chronic kidney disease with heart failure and with stage 5 chronic kidney disease, or end stage renal disease: Secondary | ICD-10-CM | POA: Insufficient documentation

## 2018-03-03 DIAGNOSIS — R05 Cough: Secondary | ICD-10-CM | POA: Diagnosis present

## 2018-03-03 DIAGNOSIS — J45909 Unspecified asthma, uncomplicated: Secondary | ICD-10-CM | POA: Diagnosis not present

## 2018-03-03 DIAGNOSIS — I509 Heart failure, unspecified: Secondary | ICD-10-CM | POA: Diagnosis not present

## 2018-03-03 DIAGNOSIS — J810 Acute pulmonary edema: Secondary | ICD-10-CM | POA: Diagnosis not present

## 2018-03-03 DIAGNOSIS — E875 Hyperkalemia: Secondary | ICD-10-CM

## 2018-03-03 DIAGNOSIS — E119 Type 2 diabetes mellitus without complications: Secondary | ICD-10-CM | POA: Diagnosis not present

## 2018-03-03 DIAGNOSIS — N186 End stage renal disease: Secondary | ICD-10-CM | POA: Insufficient documentation

## 2018-03-03 DIAGNOSIS — R0602 Shortness of breath: Secondary | ICD-10-CM | POA: Diagnosis not present

## 2018-03-03 LAB — BASIC METABOLIC PANEL
ANION GAP: 14 (ref 5–15)
BUN: 63 mg/dL — ABNORMAL HIGH (ref 6–20)
CO2: 26 mmol/L (ref 22–32)
Calcium: 9.1 mg/dL (ref 8.9–10.3)
Chloride: 93 mmol/L — ABNORMAL LOW (ref 98–111)
Creatinine, Ser: 11.68 mg/dL — ABNORMAL HIGH (ref 0.61–1.24)
GFR calc Af Amer: 5 mL/min — ABNORMAL LOW (ref >60)
GFR calc non Af Amer: 4 mL/min — ABNORMAL LOW (ref >60)
Glucose, Bld: 94 mg/dL (ref 70–99)
Potassium: 5.9 mmol/L — ABNORMAL HIGH (ref 3.5–5.1)
Sodium: 133 mmol/L — ABNORMAL LOW (ref 135–145)

## 2018-03-03 LAB — CBC
HEMATOCRIT: 43.4 % (ref 39.0–52.0)
Hemoglobin: 13.6 g/dL (ref 13.0–17.0)
MCH: 31.1 pg (ref 26.0–34.0)
MCHC: 31.3 g/dL (ref 30.0–36.0)
MCV: 99.1 fL (ref 80.0–100.0)
NRBC: 0 % (ref 0.0–0.2)
PLATELETS: 73 10*3/uL — AB (ref 150–400)
RBC: 4.38 MIL/uL (ref 4.22–5.81)
RDW: 15.7 % — ABNORMAL HIGH (ref 11.5–15.5)
WBC: 5 10*3/uL (ref 4.0–10.5)

## 2018-03-03 LAB — TROPONIN I: Troponin I: 0.07 ng/mL (ref <0.03)

## 2018-03-03 NOTE — ED Provider Notes (Signed)
Emergency Department Provider Note   I have reviewed the triage vital signs and the nursing notes.   HISTORY  Chief Complaint No chief complaint on file.   HPI Nathaniel White is a 58 y.o. male with PMH of CHF, ESRD on HD, and COPD presents to the emergency department for evaluation of cough, shortness of breath. Cough does have been going on for months.  Patient states this is his main complaint but that he has developed some shortness of breath in the past several hours. No chest pain or heart palpitations.  No fevers or chills.  Patient has dialysis next at 6 AM and was last dialyzed 2 days prior.  No flulike symptoms. Cough is non-productive. No hemoptysis.   Past Medical History:  Diagnosis Date  . Anemia   . Anxiety   . Asthma   . CHF (congestive heart failure) ([place])   . Complication of anesthesia    according to pt and spouse pt was moving around and cough while under and pt had difficulty waking up so the anesthesia had to be reversed. and pt admitted to ICU.   Marland Kitchen COPD (chronic obstructive pulmonary disease) ([place])   . Diabetes mellitus without complication ([place])    not on medications  . ESRD    on HD, M- W- F   . Hemiparesis due to old stroke ([place])    left  . Hypertension   . LV dysfunction    EF 25-30% by echo 07/2011  . Memory loss due to medical condition    due to stroke  . MR (mitral regurgitation)    moderate to severe, echo 07/2011  . Shortness of breath   . Stroke [facility])    TIA's-left sided weakness  . Tobacco abuse     Patient Active Problem List   Diagnosis Date Noted  . Thrombocytopenia ([place]) 01/17/2015  . Transfusion history 01/17/2015  . Hemodialysis AV fistula aneurysm ([place]) 11/10/2013  . Complication from renal dialysis device 11/02/2013  . Hyperkalemia 10/06/2013  . Cough 09/07/2013  . CHF, systolic dysfunction 63/84/6659  . Other complications due to renal dialysis device, implant, and graft 10/27/2012  . Cerebral embolism with cerebral  infarction ([place]) 08/13/2011  . Shortness of breath 03/13/2011  . ESRD (end stage renal disease) on dialysis ([place]) 03/13/2011  . HTN (hypertension) 03/13/2011  . H/O: CVA (cardiovascular accident) 03/13/2011  . Pleural effusion, right chronic 03/13/2011  . Anemia 03/13/2011  . Fluid overload 03/13/2011  . History of nonadherence to medical treatment 03/13/2011  . DM (diabetes mellitus) ([place]) 03/13/2011  . Tobacco abuse 03/13/2011  . Essential hypertension, benign 10/04/2008  . ESOPHAGEAL VARICES 10/04/2008  . HEMATOCHEZIA 10/04/2008  . End stage renal disease ([place]) 10/04/2008    Past Surgical History:  Procedure Laterality Date  . AV FISTULA PLACEMENT    . CARDIAC CATHETERIZATION     [facility]  . COLONOSCOPY W/ BIOPSIES AND POLYPECTOMY    . FISTULA SUPERFICIALIZATION Left 11/10/2013   Procedure: FISTULA PLICATION;  Surgeon: Serafina Mitchell, MD;  Location: [facility];  Service: Vascular;  Laterality: Left;  . KIDNEY TRANSPLANT     2011 rejected kidney 2012 back on dialysis  . LEFT AND RIGHT HEART CATHETERIZATION WITH CORONARY ANGIOGRAM N/A 10/09/2013   Procedure: LEFT AND RIGHT HEART CATHETERIZATION WITH CORONARY ANGIOGRAM;  Surgeon: Burnell Blanks, MD;  Location[facility] CATH LAB;  Service: Cardiovascular;  Laterality: N/A;  . REVISON OF ARTERIOVENOUS FISTULA Left 08/26/2013   Procedure: EXCISION OF ERODED  SKIN AND EXPLORATION OF MAIN LEFT UPPER ARM AV FISTULA;  Surgeon: Rosetta Posner, MD;  Location: [facility];  Service: Vascular;  Laterality: Left;  . REVISON OF ARTERIOVENOUS FISTULA Left 02/05/2014   Procedure: REPAIR OF ARTERIOVENOUS FISTULA ANEURYSM;  Surgeon: Serafina Mitchell, MD;  Location: [facility];  Service: Vascular;  Laterality: Left;  . SHUNTOGRAM N/A 11/05/2012   Procedure: Fistulogram;  Surgeon: Serafina Mitchell, MD;  Location: [facility] CATH LAB;  Service: Cardiovascular;  Laterality: N/A;  . TEE WITHOUT CARDIOVERSION  08/17/2011   Procedure: TRANSESOPHAGEAL ECHOCARDIOGRAM (TEE);   Surgeon: Larey Dresser, MD;  Location: [facility] ENDOSCOPY;  Service: Cardiovascular;  Laterality: N/A;  . VIDEO ASSISTED THORACOSCOPY (VATS)/DECORTICATION  08/10/11   Allergies Codeine  Family History  Problem Relation Age of Onset  . Hypertension Mother   . Varicose Veins Mother   . Diabetes Paternal Grandmother   . Cancer Paternal Grandfather   . CAD Paternal Uncle     Social History Social History   Tobacco Use  . Smoking status: Former Smoker    Packs/day: 0.50    Years: 32.00    Pack years: 16.00    Types: Cigarettes    Last attempt to quit: 10/13/2012    Years since quitting: 5.3  . Smokeless tobacco: Never Used  Substance Use Topics  . Alcohol use: No    Alcohol/week: 0.0 standard drinks  . Drug use: No    Review of Systems  Constitutional: No fever/chills Eyes: No visual changes. ENT: No sore throat. Cardiovascular: Denies chest pain. Respiratory: Positive shortness of breath and cough.  Gastrointestinal: No abdominal pain.  No nausea, no vomiting.  No diarrhea.  No constipation. Genitourinary: Negative for dysuria. Musculoskeletal: Negative for back pain. Skin: Negative for rash. Blistering on the left leg.  Neurological: Negative for headaches, focal weakness or numbness.  10-point ROS otherwise negative.  ____________________________________________   PHYSICAL EXAM:  VITAL SIGNS: ED Triage Vitals  Enc Vitals Group     BP 03/03/18 2200 (!) 146/96     Pulse Rate 03/03/18 2200 99     Resp 03/03/18 2200 (!) 26     Temp 03/03/18 2200 97.6 F (36.4 C)     Temp Source 03/03/18 2200 Oral     SpO2 03/03/18 2200 92 %     Weight 03/03/18 2202 186 lb 4.6 oz (84.5 kg)     Height 03/03/18 2202 5\' 9"  (1.753 m)     Pain Score 03/03/18 2202 0   Constitutional: Alert and oriented. Patient sitting on edge of bed with increased WOB.  Eyes: Conjunctivae are normal.  Head: Atraumatic. Nose: No congestion/rhinnorhea. Mouth/Throat: Mucous membranes are moist.    Neck: No stridor.  Cardiovascular: Normal rate, regular rhythm. Good peripheral circulation. Grossly normal heart sounds.   Respiratory: Positive increased respiratory effort.  No retractions. Lungs crackles at the bases, mild.  Gastrointestinal: Soft and nontender. No distention.  Musculoskeletal: No lower extremity tenderness nor edema. Neurologic:  Normal speech and language.  Skin:  Skin is warm, dry and intact. Blistering over the left tib/fib. No erythema or induration.   ____________________________________________   LABS (all labs ordered are listed, but only abnormal results are displayed)  Labs Reviewed  CBC - Abnormal; Notable for the following components:      Result Value   RDW 15.7 (*)    Platelets 73 (*)    All other components within normal limits  TROPONIN I - Abnormal; Notable for the following components:   Troponin  I 0.07 (*)    All other components within normal limits  BASIC METABOLIC PANEL - Abnormal; Notable for the following components:   Sodium 133 (*)    Potassium 5.9 (*)    Chloride 93 (*)    BUN 63 (*)    Creatinine, Ser 11.68 (*)    GFR calc non Af Amer 4 (*)    GFR calc Af Amer 5 (*)    All other components within normal limits   ____________________________________________  EKG   EKG Interpretation  Date/Time:  Monday March 03 2018 22:18:59 EST Ventricular Rate:  89 PR Interval:    QRS Duration: 132 QT Interval:  391 QTC Calculation: 476 R Axis:   -34 Text Interpretation:  Sinus rhythm Prolonged PR interval Probable left atrial enlargement Nonspecific intraventricular conduction delay Probable anteroseptal infarct, old Nonspecific T abnormalities, lateral leads No STEMI.  Confirmed by Nanda Quinton [phone]) on 03/03/2018 11:40:08 PM       ____________________________________________  RADIOLOGY  Dg Chest 2 View  Result Date: 03/03/2018 CLINICAL DATA:  Shortness of breath EXAM: CHEST - 2 VIEW COMPARISON:  05/26/2017 FINDINGS:  Cardiomegaly. Chronic changes in the right lower lung compatible with scarring, stable. Mild vascular congestion. Interstitial prominence I would the lungs could reflect interstitial edema or chronic interstitial lung disease. Blunting of the right costophrenic angle, favor scarring. IMPRESSION: Cardiomegaly with vascular congestion. Chronic scarring at the right lung base. Interstitial prominence could reflect interstitial edema or chronic interstitial lung disease. Electronically Signed   By: Rolm Baptise M.D.   On: 03/03/2018 22:55    ____________________________________________   PROCEDURES  Procedure(s) performed:   Procedures  CRITICAL CARE Performed by: Margette Fast Total critical care time: 35 minutes Critical care time was exclusive of separately billable procedures and treating other patients. Critical care was necessary to treat or prevent imminent or life-threatening deterioration. Critical care was time spent personally by me on the following activities: development of treatment plan with patient and/or surrogate as well as nursing, discussions with consultants, evaluation of patient's response to treatment, examination of patient, obtaining history from patient or surrogate, ordering and performing treatments and interventions, ordering and review of laboratory studies, ordering and review of radiographic studies, pulse oximetry and re-evaluation of patient's condition.  Nanda Quinton, MD Emergency Medicine  ____________________________________________   INITIAL IMPRESSION / ASSESSMENT AND PLAN / ED COURSE  Pertinent labs & imaging results that were available during my care of the patient were reviewed by me and considered in my medical decision making (see chart for details).  Patient presents to the ED with for evaluation of cough and shortness of breath.  Patient has edema on his chest x-ray.  He has increased work of breathing that is mild to moderate and mainly with  exertion.  His lowest oxygen saturation here is 92% and was placed on nasal cannula oxygen for comfort.  His lab work shows potassium of 5.9 and mild elevated troponin.  I called and spoke with Dr. Moshe Cipro with Nephrology who can evaluate in the AM after admit to [facility].  Agree that the patient does not meet criteria for emergency dialysis but given his symptoms I do feel he would benefit from monitoring in a medical setting to assess for any decompensation or arrhythmias while awaiting dialysis.  I went back to discuss the lab results and chest x-ray with the patient.  He states "I just want to go home and go to my dialysis in the morning."  His family member  was at bedside and they had been discussing the case with his mom by phone.  I discussed that the elevated troponin is likely from his pulmonary edema which I suspect is causing his cough and shortness of breath.  He is having increased shortness of breath with walking and potassium is elevated.  I discussed that he is at risk for decompensation overnight including respiratory distress, arrhythmia, cardiac arrest.  Discussed that I would advise monitoring overnight with dialysis in the morning at [facility].  Patient understands this recommendation but reports that he would rather return home and go to dialysis in several hours.  I discussed that the patient will need to sign out [place] and he is okay doing this.  I discussed that if symptoms worsen he will need to call 911 so that he can be transferred directly to a facility that has dialysis capability but that he is always welcome to return to this or any other emergency department for treatment.   ____________________________________________  FINAL CLINICAL IMPRESSION(S) / ED DIAGNOSES  Final diagnoses:  SOB (shortness of breath)  Acute pulmonary edema (HCC)  Hyperkalemia    Note:  This document was prepared using Dragon voice recognition software and may include  unintentional dictation errors.  Nanda Quinton, MD Emergency Medicine    Long, Wonda [facility], MD 03/03/18 [phone]

## 2018-03-03 NOTE — ED Triage Notes (Signed)
Dialysis pt who was dialyzed 2 days ago. C.o SOB. States he has a cough and feels like he has fluid overload.

## 2018-03-03 NOTE — ED Notes (Signed)
Date and time results received: 03/03/18 2316  Test: troponin Critical Value: 0.07  Name of Provider Notified: Dr. Laverta Baltimore  Orders Received? Or Actions Taken?: no new orders

## 2018-03-03 NOTE — Discharge Instructions (Signed)
You were seen in the ED today with cough and shortness of breath. You need to have dialysis urgently. I would like to admit you overnight for observation and dialysis in the morning but you are electing to leave [facility]. We discussed the risks of leaving and you are entitled to make this decision. Go to your dialysis tomorrow at 6 AM without fail. If you worsen overnight, you need to call 911 and be transferred to the nearest dialysis center.

## 2018-03-03 NOTE — ED Notes (Signed)
Patient transported to X-ray 

## 2018-03-04 DIAGNOSIS — N2581 Secondary hyperparathyroidism of renal origin: Secondary | ICD-10-CM | POA: Diagnosis not present

## 2018-03-04 DIAGNOSIS — E119 Type 2 diabetes mellitus without complications: Secondary | ICD-10-CM | POA: Diagnosis not present

## 2018-03-04 DIAGNOSIS — N186 End stage renal disease: Secondary | ICD-10-CM | POA: Diagnosis not present

## 2018-03-05 ENCOUNTER — Encounter (HOSPITAL_COMMUNITY): Payer: Self-pay | Admitting: Emergency Medicine

## 2018-03-05 ENCOUNTER — Ambulatory Visit (HOSPITAL_COMMUNITY)
Admission: EM | Admit: 2018-03-05 | Discharge: 2018-03-05 | Disposition: A | Discharge status: Home / Self Care | Payer: Medicare Other | Attending: Family Medicine | Admitting: Family Medicine

## 2018-03-05 ENCOUNTER — Other Ambulatory Visit: Payer: Self-pay

## 2018-03-05 DIAGNOSIS — R05 Cough: Secondary | ICD-10-CM

## 2018-03-05 DIAGNOSIS — R059 Cough, unspecified: Secondary | ICD-10-CM

## 2018-03-05 DIAGNOSIS — R0602 Shortness of breath: Secondary | ICD-10-CM

## 2018-03-05 MED ORDER — BENZONATATE 100 MG PO CAPS: ORAL_CAPSULE | ORAL | 0 refills | Status: DC

## 2018-03-05 MED ORDER — PREDNISONE 10 MG (21) PO TBPK: ORAL_TABLET | Freq: Every day | ORAL | 0 refills | Status: DC

## 2018-03-05 NOTE — ED Notes (Signed)
Bed: UC01 Expected date: 03/05/18 Expected time: 11:14 AM Means of arrival:  Comments:

## 2018-03-05 NOTE — ED Triage Notes (Signed)
Pt here for continued issues with a cough.  Pt has a history of COPD, but does not have a PCP.

## 2018-03-06 DIAGNOSIS — N2581 Secondary hyperparathyroidism of renal origin: Secondary | ICD-10-CM | POA: Diagnosis not present

## 2018-03-06 DIAGNOSIS — N186 End stage renal disease: Secondary | ICD-10-CM | POA: Diagnosis not present

## 2018-03-06 DIAGNOSIS — E119 Type 2 diabetes mellitus without complications: Secondary | ICD-10-CM | POA: Diagnosis not present

## 2018-03-08 DIAGNOSIS — N2581 Secondary hyperparathyroidism of renal origin: Secondary | ICD-10-CM | POA: Diagnosis not present

## 2018-03-08 DIAGNOSIS — N186 End stage renal disease: Secondary | ICD-10-CM | POA: Diagnosis not present

## 2018-03-08 DIAGNOSIS — E119 Type 2 diabetes mellitus without complications: Secondary | ICD-10-CM | POA: Diagnosis not present

## 2018-03-08 NOTE — ED Provider Notes (Signed)
[place] [facility]   [id number] 03/05/18 Arrival Time: 2831  ASSESSMENT & PLAN:  1. SOB (shortness of breath)   2. Cough    I have personally viewed the imaging studies ordered on 03/03/2018. No signs of PNA. Scarring.  He agrees to call his pulmonologist for prompt f/u.  Meds ordered this encounter  Medications  . benzonatate (TESSALON) 100 MG capsule    Sig: Take 1 capsule by mouth every 8 (eight) hours for cough.    Dispense:  21 capsule    Refill:  0  . predniSONE (STERAPRED UNI-PAK 21 TAB) 10 MG (21) TBPK tablet    Sig: Take by mouth daily. Take as directed.    Dispense:  21 tablet    Refill:  0   Follow-up Information    Schedule an appointment as soon as possible for a visit  with [facility].   Specialty:  Pulmonology Contact information: [address] [place] [address] [place].   Specialty:  Emergency Medicine Why:  If symptoms worsen in any way. Contact information: [address] [id number] [place] [phone]          Reviewed expectations re: course of current medical issues. Questions answered. Outlined signs and symptoms indicating need for more acute intervention. Patient verbalized understanding. After Visit Summary given.   SUBJECTIVE: History from: patient.  Nathaniel White is a 58 y.o. male who was seen in the ED on 03/03/2018. Note reviewed. "Still coughing and wheezing  a little." H/O COPD. Cough is mostly dry at this time. No SOB or CP reported. Afebrile. More fatigued than usual. SOB: none. Wheezing: moderate when present. Albuterol inhaler with mild help. Fever: no. Overall normal PO intake without n/v. Known sick contacts: no. No specific or significant aggravating or alleviating factors reported. No LE edema or leg pain. No abdominal or back pain. OTC treatment: none.  Social History    Tobacco Use  Smoking Status Former Smoker  . Packs/day: 0.50  . Years: 32.00  . Pack years: 16.00  . Types: Cigarettes  . Last attempt to quit: 10/13/2012  . Years since quitting: 5.4  Smokeless Tobacco Never Used    ROS: As per HPI. All other systems negative.    OBJECTIVE:  Vitals:   03/05/18 1125  BP: 126/60  Pulse: (!) 110  Temp: 98.3 F (36.8 C)  TempSrc: Oral  SpO2: 94%     General appearance: alert; appears fatigued HEENT: nasal congestion; clear runny nose; throat irritation secondary to post-nasal drainage Neck: supple without LAD CV: tachycardia; normal rhythm Lungs: unlabored respirations, symmetrical air entry mild bilateral wheezing; cough: moderate and non-productive Abd: soft Ext: no LE edema Skin: warm and dry Psychological: alert and cooperative; normal mood and affect  Imaging Reviewed: Dg Chest 2 View  Result Date: 03/03/2018 CLINICAL DATA:  Shortness of breath EXAM: CHEST - 2 VIEW COMPARISON:  05/26/2017 FINDINGS: Cardiomegaly. Chronic changes in the right lower lung compatible with scarring, stable. Mild vascular congestion. Interstitial prominence I would the lungs could reflect interstitial edema or chronic interstitial lung disease. Blunting of the right costophrenic angle, favor scarring. IMPRESSION: Cardiomegaly with vascular congestion. Chronic scarring at the right lung base. Interstitial prominence could reflect interstitial edema or chronic interstitial lung disease. Electronically Signed   By: Rolm Baptise M.D.   On: 03/03/2018 22:55    Allergies  Allergen Reactions  . Codeine  Shortness Of Breath    Past Medical History:  Diagnosis Date  . Anemia   . Anxiety   . Asthma   . CHF (congestive heart failure) ([place])   . Complication of anesthesia    according to pt and spouse pt was moving around and cough while under and pt had difficulty waking up so the anesthesia had to be reversed. and pt admitted to ICU.   Marland Kitchen COPD (chronic  obstructive pulmonary disease) ([place])   . Diabetes mellitus without complication ([place])    not on medications  . ESRD    on HD, M- W- F   . Hemiparesis due to old stroke ([place])    left  . Hypertension   . LV dysfunction    EF 25-30% by echo 07/2011  . Memory loss due to medical condition    due to stroke  . MR (mitral regurgitation)    moderate to severe, echo 07/2011  . Shortness of breath   . Stroke [facility])    TIA's-left sided weakness  . Tobacco abuse    Family History  Problem Relation Age of Onset  . Hypertension Mother   . Varicose Veins Mother   . Diabetes Paternal Grandmother   . Cancer Paternal Grandfather   . CAD Paternal Uncle    Social History   Socioeconomic History  . Marital status: Single    Spouse name: Not on file  . Number of children: 1  . Years of education: Not on file  . Highest education level: Not on file  Occupational History  . Occupation: Geographical information systems officer: DISABLED  Social Needs  . Financial resource strain: Not on file  . Food insecurity:    Worry: Not on file    Inability: Not on file  . Transportation needs:    Medical: Not on file    Non-medical: Not on file  Tobacco Use  . Smoking status: Former Smoker    Packs/day: 0.50    Years: 32.00    Pack years: 16.00    Types: Cigarettes    Last attempt to quit: 10/13/2012    Years since quitting: 5.4  . Smokeless tobacco: Never Used  Substance and Sexual Activity  . Alcohol use: No    Alcohol/week: 0.0 standard drinks  . Drug use: No  . Sexual activity: Not on file  Lifestyle  . Physical activity:    Days per week: Not on file    Minutes per session: Not on file  . Stress: Not on file  Relationships  . Social connections:    Talks on phone: Not on file    Gets together: Not on file    Attends religious service: Not on file    Active member of club or organization: Not on file    Attends meetings of clubs or organizations: Not on file    Relationship status:  Not on file  . Intimate partner violence:    Fear of current or ex partner: Not on file    Emotionally abused: Not on file    Physically abused: Not on file    Forced sexual activity: Not on file  Other Topics Concern  . Not on file  Social History Narrative  . Not on file           Vanessa Kick, MD 03/18/18 [phone]

## 2018-03-09 DIAGNOSIS — Z992 Dependence on renal dialysis: Secondary | ICD-10-CM | POA: Diagnosis not present

## 2018-03-09 DIAGNOSIS — T8612 Kidney transplant failure: Secondary | ICD-10-CM | POA: Diagnosis not present

## 2018-03-09 DIAGNOSIS — N186 End stage renal disease: Secondary | ICD-10-CM | POA: Diagnosis not present

## 2018-03-11 DIAGNOSIS — N2581 Secondary hyperparathyroidism of renal origin: Secondary | ICD-10-CM | POA: Diagnosis not present

## 2018-03-11 DIAGNOSIS — N186 End stage renal disease: Secondary | ICD-10-CM | POA: Diagnosis not present

## 2018-03-11 DIAGNOSIS — E119 Type 2 diabetes mellitus without complications: Secondary | ICD-10-CM | POA: Diagnosis not present

## 2018-03-13 DIAGNOSIS — E119 Type 2 diabetes mellitus without complications: Secondary | ICD-10-CM | POA: Diagnosis not present

## 2018-03-13 DIAGNOSIS — N186 End stage renal disease: Secondary | ICD-10-CM | POA: Diagnosis not present

## 2018-03-13 DIAGNOSIS — N2581 Secondary hyperparathyroidism of renal origin: Secondary | ICD-10-CM | POA: Diagnosis not present

## 2018-03-15 DIAGNOSIS — N2581 Secondary hyperparathyroidism of renal origin: Secondary | ICD-10-CM | POA: Diagnosis not present

## 2018-03-15 DIAGNOSIS — N186 End stage renal disease: Secondary | ICD-10-CM | POA: Diagnosis not present

## 2018-03-15 DIAGNOSIS — E119 Type 2 diabetes mellitus without complications: Secondary | ICD-10-CM | POA: Diagnosis not present

## 2018-03-18 DIAGNOSIS — N2581 Secondary hyperparathyroidism of renal origin: Secondary | ICD-10-CM | POA: Diagnosis not present

## 2018-03-18 DIAGNOSIS — N186 End stage renal disease: Secondary | ICD-10-CM | POA: Diagnosis not present

## 2018-03-18 DIAGNOSIS — E119 Type 2 diabetes mellitus without complications: Secondary | ICD-10-CM | POA: Diagnosis not present

## 2018-03-20 DIAGNOSIS — N186 End stage renal disease: Secondary | ICD-10-CM | POA: Diagnosis not present

## 2018-03-20 DIAGNOSIS — N2581 Secondary hyperparathyroidism of renal origin: Secondary | ICD-10-CM | POA: Diagnosis not present

## 2018-03-20 DIAGNOSIS — E119 Type 2 diabetes mellitus without complications: Secondary | ICD-10-CM | POA: Diagnosis not present

## 2018-03-22 DIAGNOSIS — N2581 Secondary hyperparathyroidism of renal origin: Secondary | ICD-10-CM | POA: Diagnosis not present

## 2018-03-22 DIAGNOSIS — E119 Type 2 diabetes mellitus without complications: Secondary | ICD-10-CM | POA: Diagnosis not present

## 2018-03-22 DIAGNOSIS — N186 End stage renal disease: Secondary | ICD-10-CM | POA: Diagnosis not present

## 2018-03-25 DIAGNOSIS — N2581 Secondary hyperparathyroidism of renal origin: Secondary | ICD-10-CM | POA: Diagnosis not present

## 2018-03-25 DIAGNOSIS — N186 End stage renal disease: Secondary | ICD-10-CM | POA: Diagnosis not present

## 2018-03-25 DIAGNOSIS — E119 Type 2 diabetes mellitus without complications: Secondary | ICD-10-CM | POA: Diagnosis not present

## 2018-03-27 DIAGNOSIS — E119 Type 2 diabetes mellitus without complications: Secondary | ICD-10-CM | POA: Diagnosis not present

## 2018-03-27 DIAGNOSIS — N2581 Secondary hyperparathyroidism of renal origin: Secondary | ICD-10-CM | POA: Diagnosis not present

## 2018-03-27 DIAGNOSIS — N186 End stage renal disease: Secondary | ICD-10-CM | POA: Diagnosis not present

## 2018-03-29 DIAGNOSIS — E119 Type 2 diabetes mellitus without complications: Secondary | ICD-10-CM | POA: Diagnosis not present

## 2018-03-29 DIAGNOSIS — N2581 Secondary hyperparathyroidism of renal origin: Secondary | ICD-10-CM | POA: Diagnosis not present

## 2018-03-29 DIAGNOSIS — N186 End stage renal disease: Secondary | ICD-10-CM | POA: Diagnosis not present

## 2018-04-01 DIAGNOSIS — N186 End stage renal disease: Secondary | ICD-10-CM | POA: Diagnosis not present

## 2018-04-01 DIAGNOSIS — E119 Type 2 diabetes mellitus without complications: Secondary | ICD-10-CM | POA: Diagnosis not present

## 2018-04-01 DIAGNOSIS — N2581 Secondary hyperparathyroidism of renal origin: Secondary | ICD-10-CM | POA: Diagnosis not present

## 2018-04-03 DIAGNOSIS — N186 End stage renal disease: Secondary | ICD-10-CM | POA: Diagnosis not present

## 2018-04-03 DIAGNOSIS — E119 Type 2 diabetes mellitus without complications: Secondary | ICD-10-CM | POA: Diagnosis not present

## 2018-04-03 DIAGNOSIS — N2581 Secondary hyperparathyroidism of renal origin: Secondary | ICD-10-CM | POA: Diagnosis not present

## 2018-04-05 DIAGNOSIS — N2581 Secondary hyperparathyroidism of renal origin: Secondary | ICD-10-CM | POA: Diagnosis not present

## 2018-04-05 DIAGNOSIS — N186 End stage renal disease: Secondary | ICD-10-CM | POA: Diagnosis not present

## 2018-04-05 DIAGNOSIS — E119 Type 2 diabetes mellitus without complications: Secondary | ICD-10-CM | POA: Diagnosis not present

## 2018-04-08 DIAGNOSIS — N2581 Secondary hyperparathyroidism of renal origin: Secondary | ICD-10-CM | POA: Diagnosis not present

## 2018-04-08 DIAGNOSIS — E119 Type 2 diabetes mellitus without complications: Secondary | ICD-10-CM | POA: Diagnosis not present

## 2018-04-08 DIAGNOSIS — N186 End stage renal disease: Secondary | ICD-10-CM | POA: Diagnosis not present

## 2018-04-09 DIAGNOSIS — T8612 Kidney transplant failure: Secondary | ICD-10-CM | POA: Diagnosis not present

## 2018-04-09 DIAGNOSIS — N186 End stage renal disease: Secondary | ICD-10-CM | POA: Diagnosis not present

## 2018-04-09 DIAGNOSIS — Z992 Dependence on renal dialysis: Secondary | ICD-10-CM | POA: Diagnosis not present

## 2018-04-10 DIAGNOSIS — N186 End stage renal disease: Secondary | ICD-10-CM | POA: Diagnosis not present

## 2018-04-10 DIAGNOSIS — E119 Type 2 diabetes mellitus without complications: Secondary | ICD-10-CM | POA: Diagnosis not present

## 2018-04-10 DIAGNOSIS — N2581 Secondary hyperparathyroidism of renal origin: Secondary | ICD-10-CM | POA: Diagnosis not present

## 2018-04-11 ENCOUNTER — Other Ambulatory Visit: Payer: Self-pay

## 2018-04-11 ENCOUNTER — Encounter (HOSPITAL_COMMUNITY): Payer: Self-pay | Admitting: Emergency Medicine

## 2018-04-11 ENCOUNTER — Ambulatory Visit (HOSPITAL_COMMUNITY)
Admission: EM | Admit: 2018-04-11 | Discharge: 2018-04-11 | Disposition: A | Discharge status: Home / Self Care | Payer: Medicare Other | Attending: Family Medicine | Admitting: Family Medicine

## 2018-04-11 ENCOUNTER — Ambulatory Visit (INDEPENDENT_AMBULATORY_CARE_PROVIDER_SITE_OTHER): Payer: Medicare Other

## 2018-04-11 DIAGNOSIS — M79672 Pain in left foot: Secondary | ICD-10-CM

## 2018-04-11 DIAGNOSIS — M7989 Other specified soft tissue disorders: Secondary | ICD-10-CM | POA: Diagnosis not present

## 2018-04-11 MED ORDER — PREDNISONE 20 MG PO TABS: 40.0000 mg | ORAL_TABLET | Freq: Every day | ORAL | 0 refills | Status: AC

## 2018-04-11 MED ORDER — ACETAMINOPHEN 500 MG PO TABS: 500.0000 mg | ORAL_TABLET | Freq: Four times a day (QID) | ORAL | 0 refills | Status: DC | PRN

## 2018-04-11 NOTE — ED Triage Notes (Signed)
PT reports insect bite on left foot for 3 days.

## 2018-04-11 NOTE — Discharge Instructions (Signed)
I do not see any indication of infection to your foot.  Your bones look normal on xray.  We will try prednisone to help with swelling. Please use ace wrap as well as ice and elevate the foot.  Monitor your blood sugar and your diet as prednisone can increase your blood sugar.  If worsening of swelling, pain, fevers, redness or otherwise worsening please return or follow with your primary care provider.

## 2018-04-11 NOTE — ED Provider Notes (Signed)
[place]    CSN: [id number] Arrival date & time: 04/11/18  1255     History   Chief Complaint Chief Complaint  Patient presents with  . Insect Bite    HPI Nathaniel White is a 58 y.o. male.   [name] presents with complaints of left foot pain. Noticed it approximately 2-3 days ago and hasn't improved. No known injury. Denies any neuropathy, no new change or loss of sensation. No break in skin. Throbbing pain. No fevers. Took aspirin which helped some with pain. No fevers. Denies any previous similar. Denies any gout history. Patient is on dialysis, T-R-Sat. States he has less mobility to left side s/p stroke, no change. No treatment for diabetes. States baseline has more swelling to left leg than right.    ROS per HPI, negative if not otherwise mentioned.      Past Medical History:  Diagnosis Date  . Anemia   . Anxiety   . Asthma   . CHF (congestive heart failure) ([place])   . Complication of anesthesia    according to pt and spouse pt was moving around and cough while under and pt had difficulty waking up so the anesthesia had to be reversed. and pt admitted to ICU.   Marland Kitchen COPD (chronic obstructive pulmonary disease) ([place])   . Diabetes mellitus without complication ([place])    not on medications  . ESRD    on HD, M- W- F   . Hemiparesis due to old stroke ([place])    left  . Hypertension   . LV dysfunction    EF 25-30% by echo 07/2011  . Memory loss due to medical condition    due to stroke  . MR (mitral regurgitation)    moderate to severe, echo 07/2011  . Shortness of breath   . Stroke [facility])    TIA's-left sided weakness  . Tobacco abuse     Patient Active Problem List   Diagnosis Date Noted  . Thrombocytopenia ([place]) 01/17/2015  . Transfusion history 01/17/2015  . 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 Complication from renal dialysis device 11/02/2013  . Hyperkalemia 10/06/2013  . Cough 09/07/2013  . CHF, systolic dysfunction  [id number]/[id number]  . Other complications due to renal dialysis device, implant, and graft 10/27/2012  . Cerebral embolism with cerebral infarction ([place]) 08/13/2011  . Shortness of breath 03/13/2011  . ESRD (end stage renal disease) on dialysis ([place]) 03/13/2011  . HTN (hypertension) 03/13/2011  . H/O: CVA (cardiovascular accident) 03/13/2011  . Pleural effusion, right chronic 03/13/2011  . Anemia 03/13/2011  . Fluid overload 03/13/2011  . History of nonadherence to medical treatment 03/13/2011  . DM (diabetes mellitus) ([place]) 03/13/2011  . Tobacco abuse 03/13/2011  . Essential hypertension, benign 10/04/2008  . ESOPHAGEAL VARICES 10/04/2008  . HEMATOCHEZIA 10/04/2008  . End stage renal disease ([place]) 10/04/2008    Past Surgical History:  Procedure Laterality Date  . AV FISTULA PLACEMENT    . CARDIAC CATHETERIZATION     [facility]  . COLONOSCOPY W/ BIOPSIES AND POLYPECTOMY    . FISTULA SUPERFICIALIZATION Left 11/10/2013   Procedure: FISTULA PLICATION;  Surgeon: Serafina Mitchell, MD;  Location: [place];  Service: Vascular;  Laterality: Left;  . KIDNEY TRANSPLANT     2011 rejected kidney 2012 back on dialysis  . LEFT AND RIGHT HEART CATHETERIZATION WITH CORONARY ANGIOGRAM N/A 10/09/2013   Procedure: LEFT AND RIGHT HEART CATHETERIZATION WITH CORONARY ANGIOGRAM;  Surgeon: Burnell Blanks, MD;  Location: [place] CATH LAB;  Service: Cardiovascular;  Laterality: N/A;  . REVISON OF ARTERIOVENOUS FISTULA Left 08/26/2013   Procedure: EXCISION OF ERODED SKIN AND EXPLORATION OF MAIN LEFT UPPER ARM AV FISTULA;  Surgeon: Rosetta Posner, MD;  Location: [place];  Service: Vascular;  Laterality: Left;  . REVISON OF ARTERIOVENOUS FISTULA Left 02/05/2014   Procedure: REPAIR OF ARTERIOVENOUS FISTULA ANEURYSM;  Surgeon: Serafina Mitchell, MD;  Location: [place];  Service: Vascular;  Laterality: Left;  . SHUNTOGRAM N/A 11/05/2012   Procedure: Fistulogram;  Surgeon: Serafina Mitchell, MD;  Location: [facility] CATH LAB;  Service:  Cardiovascular;  Laterality: N/A;  . TEE WITHOUT CARDIOVERSION  08/17/2011   Procedure: TRANSESOPHAGEAL ECHOCARDIOGRAM (TEE);  Surgeon: Larey Dresser, MD;  Location: [place];  Service: Cardiovascular;  Laterality: N/A;  . VIDEO ASSISTED THORACOSCOPY (VATS)/DECORTICATION  08/10/11       Home Medications    Prior to Admission medications   Medication Sig Start Date End Date Taking? Authorizing Provider  acetaminophen (TYLENOL) 500 MG tablet Take 1 tablet (500 mg total) by mouth every 6 (six) hours as needed. 04/11/18   Zigmund Gottron, NP  albuterol (PROVENTIL HFA;VENTOLIN HFA) 108 (90 Base) MCG/ACT inhaler Inhale 1-2 puffs into the lungs every 6 (six) hours as needed for wheezing or shortness of breath. 05/02/16   Palumbo, April, MD  cetirizine (ZYRTEC ALLERGY) 10 MG tablet Take 1 tablet (10 mg total) by mouth daily. 05/02/16   Palumbo, April, MD  clonazePAM Bobbye [place]) 1 MG tablet 1 mg 01/27/10   [provider]  cloNIDine (CATAPRES-TTS-3) 0.3 mg/24hr patch WEEKLY 01/27/10   [provider]  Fluticasone-Salmeterol (ADVAIR) 250-50 MCG/DOSE AEPB Inhale 2 puffs into the lungs daily at 12 noon.    [provider]  FOSRENOL 1000 MG chewable tablet CHEW & SWALLOW 2 TABLETS BY MOUTH WITH MEALS 12/25/14   [provider]  glucose blood (ONE TOUCH ULTRA TEST) test strip  12/26/09   [provider]  HYDROcodone-homatropine (HYCODAN) 5-1.5 MG/5ML syrup Take 5 mLs by mouth at bedtime as needed for cough (take at night to help with cough). 03/17/17   Blanchie Dessert, MD  labetalol (NORMODYNE) 300 MG tablet Take 300 mg by mouth 2 (two) times daily.     [provider]  predniSONE (DELTASONE) 20 MG tablet Take 2 tablets (40 mg total) by mouth daily with breakfast for 5 days. 04/11/18 04/16/18  Zigmund Gottron, NP  SENSIPAR 30 MG tablet  01/11/15   [provider]  tadalafil (CIALIS) 5 MG tablet Take 5 mg by mouth daily as needed for erectile  dysfunction.    [provider]    Family History Family History  Problem Relation Age of Onset  . Hypertension Mother   . Varicose Veins Mother   . Diabetes Paternal Grandmother   . Cancer Paternal Grandfather   . CAD Paternal Uncle     Social History Social History   Tobacco Use  . Smoking status: Former Smoker    Packs/day: 0.50    Years: 32.00    Pack years: 16.00    Types: Cigarettes    Last attempt to quit: 10/13/2012    Years since quitting: 5.4  . Smokeless tobacco: Never Used  Substance Use Topics  . Alcohol use: No    Alcohol/week: 0.0 standard drinks  . Drug use: No     Allergies   Codeine   Review of Systems Review of Systems   Physical Exam Triage Vital  Signs ED Triage Vitals  Enc Vitals Group     BP 04/11/18 1326 134/79     Pulse Rate 04/11/18 1326 90     Resp 04/11/18 1326 16     Temp 04/11/18 1326 98 F (36.7 C)     Temp Source 04/11/18 1326 Oral     SpO2 04/11/18 1326 94 %     Weight --      Height --      Head Circumference --      Peak Flow --      Pain Score 04/11/18 1328 7     Pain Loc --      Pain Edu? --      Excl. in [place]? --    No data found.  Updated Vital Signs BP 134/79   Pulse 90   Temp 98 F (36.7 C) (Oral)   Resp 16   SpO2 94%    Physical Exam Constitutional:      Appearance: He is well-developed.  Cardiovascular:     Rate and Rhythm: Normal rate and regular rhythm.  Pulmonary:     Effort: Pulmonary effort is normal.     Breath sounds: Normal breath sounds.  Musculoskeletal:     Left foot: Normal range of motion and normal capillary refill. Tenderness, bony tenderness and swelling present. No crepitus, deformity or laceration.       Feet:     Comments: Point tenderness to left lateral foot with generalized swelling noted; faint pulses bilaterally to pedal pulses; no redness or warmth; no open skin or wound; ambulatory; cap refill < 2 seconds ; gross sensation intact to toes; photo of swelling    Skin:    General: Skin is warm and dry.  Neurological:     Mental Status: He is alert and oriented to person, place, and time.          UC Treatments / Results  Labs (all labs ordered are listed, but only abnormal results are displayed) Labs Reviewed - No data to display  EKG None  Radiology Dg Foot Complete Left  Result Date: 04/11/2018 CLINICAL DATA:  58 year old male with a history of foot swelling EXAM: LEFT FOOT - COMPLETE 3+ VIEW COMPARISON:  None. FINDINGS: No acute fracture. No radiopaque foreign body. Nonspecific soft tissue swelling of the foot and ankle. Calcifications of the tibial and pedal vasculature. Degenerative changes of the midfoot and hindfoot. IMPRESSION: Negative for acute bony abnormality. Nonspecific soft tissue swelling of the foot and ankle. Tibial and pedal atherosclerosis. Electronically Signed   By: Corrie Mckusick D.O.   On: 04/11/2018 14:31    Procedures Procedures (including critical care time)  Medications Ordered in UC Medications - No data to display  Initial Impression / Assessment and Plan / UC Course  I have reviewed the triage vital signs and the nursing notes.  Pertinent labs & imaging results that were available during my care of the patient were reviewed by me and considered in my medical decision making (see chart for details).     No skin breakdown, no redness or warmth. No injury. Xray reassuring. Cap refill wnl. Gout considered- patient is dialysis patient. Prednisone provided with return precautions. Patient verbalized understanding and agreeable to plan.  Ambulatory out of clinic without difficulty.    Final Clinical Impressions(s) / UC Diagnoses   Final diagnoses:  Left foot pain     Discharge Instructions     I do not see any indication of infection to your foot.  Your bones look normal on xray.  We will try prednisone to help with swelling. Please use ace wrap as well as ice and elevate the foot.  Monitor your  blood sugar and your diet as prednisone can increase your blood sugar.  If worsening of swelling, pain, fevers, redness or otherwise worsening please return or follow with your primary care provider.     ED Prescriptions    Medication Sig Dispense Auth. Provider   acetaminophen (TYLENOL) 500 MG tablet Take 1 tablet (500 mg total) by mouth every 6 (six) hours as needed. 30 tablet Augusto Gamble B, NP   predniSONE (DELTASONE) 20 MG tablet Take 2 tablets (40 mg total) by mouth daily with breakfast for 5 days. 10 tablet Zigmund Gottron, NP     Controlled Substance Prescriptions [place] Controlled Substance Registry consulted? Not Applicable   Zigmund Gottron, NP 04/11/18 [phone]

## 2018-04-12 DIAGNOSIS — N2581 Secondary hyperparathyroidism of renal origin: Secondary | ICD-10-CM | POA: Diagnosis not present

## 2018-04-12 DIAGNOSIS — N186 End stage renal disease: Secondary | ICD-10-CM | POA: Diagnosis not present

## 2018-04-12 DIAGNOSIS — E119 Type 2 diabetes mellitus without complications: Secondary | ICD-10-CM | POA: Diagnosis not present

## 2018-04-15 DIAGNOSIS — N2581 Secondary hyperparathyroidism of renal origin: Secondary | ICD-10-CM | POA: Diagnosis not present

## 2018-04-15 DIAGNOSIS — E119 Type 2 diabetes mellitus without complications: Secondary | ICD-10-CM | POA: Diagnosis not present

## 2018-04-15 DIAGNOSIS — N186 End stage renal disease: Secondary | ICD-10-CM | POA: Diagnosis not present

## 2018-04-17 DIAGNOSIS — N2581 Secondary hyperparathyroidism of renal origin: Secondary | ICD-10-CM | POA: Diagnosis not present

## 2018-04-17 DIAGNOSIS — E119 Type 2 diabetes mellitus without complications: Secondary | ICD-10-CM | POA: Diagnosis not present

## 2018-04-17 DIAGNOSIS — N186 End stage renal disease: Secondary | ICD-10-CM | POA: Diagnosis not present

## 2018-04-19 DIAGNOSIS — E119 Type 2 diabetes mellitus without complications: Secondary | ICD-10-CM | POA: Diagnosis not present

## 2018-04-19 DIAGNOSIS — N186 End stage renal disease: Secondary | ICD-10-CM | POA: Diagnosis not present

## 2018-04-19 DIAGNOSIS — N2581 Secondary hyperparathyroidism of renal origin: Secondary | ICD-10-CM | POA: Diagnosis not present

## 2018-04-22 DIAGNOSIS — N186 End stage renal disease: Secondary | ICD-10-CM | POA: Diagnosis not present

## 2018-04-22 DIAGNOSIS — E119 Type 2 diabetes mellitus without complications: Secondary | ICD-10-CM | POA: Diagnosis not present

## 2018-04-22 DIAGNOSIS — N2581 Secondary hyperparathyroidism of renal origin: Secondary | ICD-10-CM | POA: Diagnosis not present

## 2018-04-24 DIAGNOSIS — N186 End stage renal disease: Secondary | ICD-10-CM | POA: Diagnosis not present

## 2018-04-24 DIAGNOSIS — N2581 Secondary hyperparathyroidism of renal origin: Secondary | ICD-10-CM | POA: Diagnosis not present

## 2018-04-24 DIAGNOSIS — E119 Type 2 diabetes mellitus without complications: Secondary | ICD-10-CM | POA: Diagnosis not present

## 2018-04-26 DIAGNOSIS — E119 Type 2 diabetes mellitus without complications: Secondary | ICD-10-CM | POA: Diagnosis not present

## 2018-04-26 DIAGNOSIS — N186 End stage renal disease: Secondary | ICD-10-CM | POA: Diagnosis not present

## 2018-04-26 DIAGNOSIS — N2581 Secondary hyperparathyroidism of renal origin: Secondary | ICD-10-CM | POA: Diagnosis not present

## 2018-04-29 DIAGNOSIS — N186 End stage renal disease: Secondary | ICD-10-CM | POA: Diagnosis not present

## 2018-04-29 DIAGNOSIS — E119 Type 2 diabetes mellitus without complications: Secondary | ICD-10-CM | POA: Diagnosis not present

## 2018-04-29 DIAGNOSIS — N2581 Secondary hyperparathyroidism of renal origin: Secondary | ICD-10-CM | POA: Diagnosis not present

## 2018-04-29 NOTE — Progress Notes (Signed)
Patient ID: Nathaniel White, male   DOB: Feb 20, 1960, 58 y.o.   MRN: [id number]     Nathaniel White, is a 58 y.o. male  Nathaniel White:[id number]  Nathaniel White:[id number]  DOB - Jun 05, 1960  Subjective:  Chief Complaint and HPI: Nathaniel White is a 58 y.o. male here today [facility] for L foot pain.  Has new kidney doctor at [facility].  T,Th, and Saturday are dialysis days.  On dialysis for about 20 years.  Foot is improving.  There is a small tender pustule still causing him a little discomfort.  Pain overall much improved.  Not on BP meds bc BP has been controlled and gets checked every time he goes for dialysis.  See hospital notes below.  Social:  Lives with 2 other people.    From UC note: No skin breakdown, no redness or warmth. No injury. Xray reassuring. Cap refill wnl. Gout considered- patient is dialysis patient. Prednisone provided with return precautions. Patient verbalized understanding and agreeable to plan.  Ambulatory out of clinic without difficulty.    ROS:   Constitutional:  No f/c, No night sweats, No unexplained weight loss. EENT:  No vision changes, No blurry vision, No hearing changes. No mouth, throat, or ear problems.  Respiratory: No cough, No SOB Cardiac: No CP, no palpitations GI:  No abd pain, No N/V/D. GU: No Urinary s/sx Musculoskeletal: No joint pain Neuro: No headache, no dizziness, no motor weakness.  Skin: No rash Endocrine:  No polydipsia. No polyuria.  Psych: Denies SI/HI  No problems updated.  ALLERGIES: Allergies  Allergen Reactions  . Codeine Shortness Of Breath    PAST MEDICAL HISTORY: Past Medical History:  Diagnosis Date  . Anemia   . Anxiety   . Asthma   . CHF (congestive heart failure) ([place])   . Complication of anesthesia    according to pt and spouse pt was moving around and cough while under and pt had difficulty waking up so the anesthesia had to be reversed. and pt admitted to ICU.   Marland Kitchen COPD (chronic obstructive  pulmonary disease) ([place])   . Diabetes mellitus without complication ([place])    not on medications  . ESRD    on HD, M- W- F   . Hemiparesis due to old stroke ([place])    left  . Hypertension   . LV dysfunction    EF 25-30% by echo 07/2011  . Memory loss due to medical condition    due to stroke  . MR (mitral regurgitation)    moderate to severe, echo 07/2011  . Shortness of breath   . Stroke [facility])    TIA's-left sided weakness  . Tobacco abuse     MEDICATIONS AT HOME: Prior to Admission medications   Medication Sig Start Date End Date Taking? Authorizing Provider  acetaminophen (TYLENOL) 500 MG tablet Take 1 tablet (500 mg total) by mouth every 6 (six) hours as needed. 04/11/18   Zigmund Gottron, NP  albuterol (VENTOLIN HFA) 108 (90 Base) MCG/ACT inhaler Inhale 1-2 puffs into the lungs every 6 (six) hours as needed for wheezing or shortness of breath. 04/30/18   Thereasa Solo, Dionne Bucy, PA-C  cetirizine (ZYRTEC ALLERGY) 10 MG tablet Take 1 tablet (10 mg total) by mouth daily. 05/02/16   Palumbo, April, MD  clonazePAM (KLONOPIN) 1 MG tablet 1 mg 01/27/10   [provider]  Fluticasone-Salmeterol (ADVAIR) 250-50 MCG/DOSE AEPB Inhale 2 puffs into the lungs daily at 12 noon. 04/30/18  ,  M, PA-C  FOSRENOL 1000 MG chewable tablet CHEW & SWALLOW 2 TABLETS BY MOUTH WITH MEALS 12/25/14   [provider]  glucose blood (ONE TOUCH ULTRA TEST) test strip  12/26/09   [provider]  mupirocin ointment (BACTROBAN) 2 % Apply bid for 1 week 04/30/18   Mathis Dad  SENSIPAR 30 MG tablet  01/11/15   [provider]  tadalafil (CIALIS) 5 MG tablet Take 5 mg by mouth daily as needed for erectile dysfunction.    [provider]     Objective:  EXAM:   Vitals:   04/30/18 0932  BP: (!) 151/95  Pulse: 93  Resp: 16  Temp: 97.9 F (36.6 C)  TempSrc: Oral  SpO2: 97%  Weight: 176 lb 6.4 oz (80 kg)  Height: 5\' 9"  (1.753 m)    General  appearance : A&OX3. NAD. Non-toxic-appearing HEENT: Atraumatic and Normocephalic.  PERRLA. EOM intact.  Chest/Lungs:  Breathing-non-labored, Good air entry bilaterally, breath sounds normal without rales, rhonchi, or wheezing  CVS: S1 S2 regular, no murmurs, gallops, rubs  Abdomen: Bowel sounds present, Non tender and not distended with no gaurding, rigidity or rebound. Extremities: Bilateral Lower Ext shows no edema, both legs are warm to touch with = pulse throughout.  Small tender pustule L lateral foot without induration or surrounding erythema Neurology:  CN II-XII grossly intact, Non focal.   Psych:  TP linear. J/I WNL. Normal speech. Appropriate eye contact and affect.  Skin:  No Rash  Data Review Lab Results  Component Value Date   HGBA1C 5.4 08/13/2011     Assessment & Plan   1. Cellulitis and abscess of foot - mupirocin ointment (BACTROBAN) 2 %; Apply bid for 1 week  Dispense: 22 g; Refill: 0  2. Bronchospasm Stable Continue- - Fluticasone-Salmeterol (ADVAIR) 250-50 MCG/DOSE AEPB; Inhale 2 puffs into the lungs daily at 12 noon.  Dispense: 60 each; Refill: 2 - albuterol (VENTOLIN HFA) 108 (90 Base) MCG/ACT inhaler; Inhale 1-2 puffs into the lungs every 6 (six) hours as needed for wheezing or shortness of breath.  Dispense: 1 Inhaler; Refill: 0  3. ESRD (end stage renal disease) on dialysis ([place]) F/up on BP with nephrologist and bring readings to next appt here(it is checked 3 times weekly there)   Patient have been counseled extensively about nutrition and exercise  Return in about 6 weeks (around 06/11/2018) for assign PCP/labs/elevated BP and ESRD.  The patient was given clear instructions to go to ER or return to medical center if symptoms don't improve, worsen or new problems develop. The patient verbalized understanding. The patient was told to call to get lab results if they haven't heard anything in the next week.     Freeman Caldron, PA-C [facility] [place], [place]   04/30/2018, 9:51 AM

## 2018-04-30 ENCOUNTER — Other Ambulatory Visit: Payer: Self-pay | Admitting: Pharmacist

## 2018-04-30 ENCOUNTER — Ambulatory Visit: Payer: Medicare Other | Attending: Family Medicine | Admitting: Physician Assistant

## 2018-04-30 ENCOUNTER — Other Ambulatory Visit: Payer: Self-pay

## 2018-04-30 VITALS — BP 151/95 | HR 93 | Temp 97.9°F | Resp 16 | Ht 69.0 in | Wt 176.4 lb

## 2018-04-30 DIAGNOSIS — M79672 Pain in left foot: Secondary | ICD-10-CM | POA: Diagnosis not present

## 2018-04-30 DIAGNOSIS — I340 Nonrheumatic mitral (valve) insufficiency: Secondary | ICD-10-CM | POA: Diagnosis not present

## 2018-04-30 DIAGNOSIS — N186 End stage renal disease: Secondary | ICD-10-CM | POA: Diagnosis not present

## 2018-04-30 DIAGNOSIS — R413 Other amnesia: Secondary | ICD-10-CM | POA: Diagnosis not present

## 2018-04-30 DIAGNOSIS — E1122 Type 2 diabetes mellitus with diabetic chronic kidney disease: Secondary | ICD-10-CM | POA: Insufficient documentation

## 2018-04-30 DIAGNOSIS — L02612 Cutaneous abscess of left foot: Secondary | ICD-10-CM | POA: Diagnosis not present

## 2018-04-30 DIAGNOSIS — J9801 Acute bronchospasm: Secondary | ICD-10-CM | POA: Diagnosis not present

## 2018-04-30 DIAGNOSIS — L03119 Cellulitis of unspecified part of limb: Secondary | ICD-10-CM

## 2018-04-30 DIAGNOSIS — Z992 Dependence on renal dialysis: Secondary | ICD-10-CM | POA: Diagnosis not present

## 2018-04-30 DIAGNOSIS — I132 Hypertensive heart and chronic kidney disease with heart failure and with stage 5 chronic kidney disease, or end stage renal disease: Secondary | ICD-10-CM | POA: Diagnosis not present

## 2018-04-30 DIAGNOSIS — I509 Heart failure, unspecified: Secondary | ICD-10-CM | POA: Diagnosis not present

## 2018-04-30 DIAGNOSIS — Z8673 Personal history of transient ischemic attack (TIA), and cerebral infarction without residual deficits: Secondary | ICD-10-CM | POA: Insufficient documentation

## 2018-04-30 DIAGNOSIS — L03116 Cellulitis of left lower limb: Secondary | ICD-10-CM

## 2018-04-30 DIAGNOSIS — L02619 Cutaneous abscess of unspecified foot: Secondary | ICD-10-CM

## 2018-04-30 MED ORDER — FLUTICASONE-SALMETEROL 250-50 MCG/DOSE IN AEPB: 1.0000 | INHALATION_SPRAY | Freq: Two times a day (BID) | RESPIRATORY_TRACT | 2 refills | Status: DC

## 2018-04-30 MED ORDER — FLUTICASONE-SALMETEROL 250-50 MCG/DOSE IN AEPB: 2.0000 | INHALATION_SPRAY | Freq: Every day | RESPIRATORY_TRACT | 2 refills | Status: DC

## 2018-04-30 MED ORDER — ALBUTEROL SULFATE HFA 108 (90 BASE) MCG/ACT IN AERS: 1.0000 | INHALATION_SPRAY | Freq: Four times a day (QID) | RESPIRATORY_TRACT | 0 refills | Status: DC | PRN

## 2018-04-30 MED ORDER — MUPIROCIN 2 % EX OINT: TOPICAL_OINTMENT | CUTANEOUS | 0 refills | Status: DC

## 2018-04-30 MED FILL — ALBUTEROL SULFATE HFA 108 (: 108 (90 BAS | 25 days supply | Qty: 18 | Fill #0

## 2018-04-30 MED FILL — MUPIROCIN 2% OINTMENT: 2 | 7 days supply | Qty: 22 | Fill #0

## 2018-04-30 MED FILL — !ADVAIR 250/50 DISKUS: 250-50 | 30 days supply | Qty: 60 | Fill #0

## 2018-05-01 DIAGNOSIS — E119 Type 2 diabetes mellitus without complications: Secondary | ICD-10-CM | POA: Diagnosis not present

## 2018-05-01 DIAGNOSIS — N186 End stage renal disease: Secondary | ICD-10-CM | POA: Diagnosis not present

## 2018-05-01 DIAGNOSIS — N2581 Secondary hyperparathyroidism of renal origin: Secondary | ICD-10-CM | POA: Diagnosis not present

## 2018-05-03 DIAGNOSIS — N186 End stage renal disease: Secondary | ICD-10-CM | POA: Diagnosis not present

## 2018-05-03 DIAGNOSIS — E119 Type 2 diabetes mellitus without complications: Secondary | ICD-10-CM | POA: Diagnosis not present

## 2018-05-03 DIAGNOSIS — N2581 Secondary hyperparathyroidism of renal origin: Secondary | ICD-10-CM | POA: Diagnosis not present

## 2018-05-06 DIAGNOSIS — N2581 Secondary hyperparathyroidism of renal origin: Secondary | ICD-10-CM | POA: Diagnosis not present

## 2018-05-06 DIAGNOSIS — N186 End stage renal disease: Secondary | ICD-10-CM | POA: Diagnosis not present

## 2018-05-06 DIAGNOSIS — E119 Type 2 diabetes mellitus without complications: Secondary | ICD-10-CM | POA: Diagnosis not present

## 2018-05-08 ENCOUNTER — Ambulatory Visit (INDEPENDENT_AMBULATORY_CARE_PROVIDER_SITE_OTHER): Payer: Medicare Other | Admitting: Podiatry

## 2018-05-08 ENCOUNTER — Other Ambulatory Visit: Payer: Self-pay

## 2018-05-08 ENCOUNTER — Encounter: Payer: Self-pay | Admitting: Podiatry

## 2018-05-08 VITALS — Temp 97.5°F

## 2018-05-08 DIAGNOSIS — R600 Localized edema: Secondary | ICD-10-CM | POA: Diagnosis not present

## 2018-05-08 DIAGNOSIS — L97521 Non-pressure chronic ulcer of other part of left foot limited to breakdown of skin: Secondary | ICD-10-CM | POA: Diagnosis not present

## 2018-05-08 DIAGNOSIS — I872 Venous insufficiency (chronic) (peripheral): Secondary | ICD-10-CM | POA: Diagnosis not present

## 2018-05-08 DIAGNOSIS — N186 End stage renal disease: Secondary | ICD-10-CM | POA: Diagnosis not present

## 2018-05-08 DIAGNOSIS — I83025 Varicose veins of left lower extremity with ulcer other part of foot: Secondary | ICD-10-CM

## 2018-05-08 DIAGNOSIS — E119 Type 2 diabetes mellitus without complications: Secondary | ICD-10-CM | POA: Diagnosis not present

## 2018-05-08 DIAGNOSIS — N2581 Secondary hyperparathyroidism of renal origin: Secondary | ICD-10-CM | POA: Diagnosis not present

## 2018-05-08 NOTE — Progress Notes (Signed)
Subjective:  Patient ID: Nathaniel White, male    DOB: [birth year],  MRN: [id number]  Chief Complaint  Patient presents with  . Wound Check    L-lateral of heel; "no injury done, just came up looked like a boil at first; have had some clear drainage and blood come out; pain is (5/10) today; x3wks"    58 y.o. male presents with the above complaint.  History above confirmed with patient  Review of Systems: Negative except as noted in the HPI. Denies N/V/F/Ch.  Past Medical History:  Diagnosis Date  . Anemia   . Anxiety   . Asthma   . CHF (congestive heart failure) ([place])   . Complication of anesthesia    according to pt and spouse pt was moving around and cough while under and pt had difficulty waking up so the anesthesia had to be reversed. and pt admitted to ICU.   Marland Kitchen COPD (chronic obstructive pulmonary disease) ([place])   . Diabetes mellitus without complication ([place])    not on medications  . ESRD    on HD, M- W- F   . Hemiparesis due to old stroke ([place])    left  . Hypertension   . LV dysfunction    EF 25-30% by echo 07/2011  . Memory loss due to medical condition    due to stroke  . MR (mitral regurgitation)    moderate to severe, echo 07/2011  . Shortness of breath   . Stroke [facility])    TIA's-left sided weakness  . Tobacco abuse     Current Outpatient Medications:  .  acetaminophen (TYLENOL) 500 MG tablet, Take 1 tablet (500 mg total) by mouth every 6 (six) hours as needed., Disp: 30 tablet, Rfl: 0 .  albuterol (VENTOLIN HFA) 108 (90 Base) MCG/ACT inhaler, Inhale 1-2 puffs into the lungs every 6 (six) hours as needed for wheezing or shortness of breath., Disp: 1 Inhaler, Rfl: 0 .  cetirizine (ZYRTEC ALLERGY) 10 MG tablet, Take 1 tablet (10 mg total) by mouth daily., Disp: 30 tablet, Rfl: 0 .  clonazePAM (KLONOPIN) 1 MG tablet, 1 mg, Disp: , Rfl:  .  Fluticasone-Salmeterol (ADVAIR) 250-50 MCG/DOSE AEPB, Inhale 1 puff into the lungs 2 (two) times a day., Disp: 60 each, Rfl: 2 .   FOSRENOL 1000 MG chewable tablet, CHEW & SWALLOW 2 TABLETS BY MOUTH WITH MEALS, Disp: , Rfl: 12 .  glucose blood (ONE TOUCH ULTRA TEST) test strip, , Disp: , Rfl:  .  mupirocin ointment (BACTROBAN) 2 %, Apply bid for 1 week, Disp: 22 g, Rfl: 0 .  SENSIPAR 30 MG tablet, , Disp: , Rfl:  .  tadalafil (CIALIS) 5 MG tablet, Take 5 mg by mouth daily as needed for erectile dysfunction., Disp: , Rfl:   Social History   Tobacco Use  Smoking Status Former Smoker  . Packs/day: 0.50  . Years: 32.00  . Pack years: 16.00  . Types: Cigarettes  . Last attempt to quit: 10/13/2012  . Years since quitting: 5.5  Smokeless Tobacco Never Used    Allergies  Allergen Reactions  . Codeine Shortness Of Breath   Objective:   Vitals:   05/08/18 1350  Temp: (!) 97.5 F (36.4 C)   There is no height or weight on file to calculate BMI. Constitutional Well developed. Well nourished.  Vascular Dorsalis pedis pulses palpable bilaterally. Posterior tibial pulses palpable bilaterally. Venous insufficiency left lower extremity with edema Capillary refill normal to all digits.  No cyanosis or  clubbing noted. Pedal hair growth normal.  Neurologic Normal speech. Oriented to person, place, and time. Epicritic sensation to light touch grossly present bilaterally.  Dermatologic Nails normal Skin small 0.2 x 0.3 wound about the lateral aspect of the left hindfoot without active drainage.  No warmth erythema signs of acute infection  Orthopedic: Normal joint ROM without pain or crepitus bilaterally. No visible deformities. No bony tenderness.   Radiographs: Taken and reviewed no underlying osseous abnormalities no acute fractures dislocation Assessment:   1. Venous stasis ulcer of other part of left foot limited to breakdown of skin with varicose veins (HCC)   2. Venous (peripheral) insufficiency   3. Localized edema    Plan:  Patient was evaluated and treated and all questions answered.  Ulceration  left foot with venous stasis, possible venous foot ulcer -Multilayer compression dressing applied for compression and frequently ulceration.  Advised to leave on for a week and thereafter to apply ointment daily  Procedure: Multilayer Compression dressing Rationale: venous insufficiency Technique: Unna boot, cast padding, Coban compression dressing applied Disposition: Patient tolerated procedure well.  l  Return in about 4 weeks (around 06/05/2018) for Wound Care, Left.

## 2018-05-09 DIAGNOSIS — N186 End stage renal disease: Secondary | ICD-10-CM | POA: Diagnosis not present

## 2018-05-09 DIAGNOSIS — Z992 Dependence on renal dialysis: Secondary | ICD-10-CM | POA: Diagnosis not present

## 2018-05-09 DIAGNOSIS — T8612 Kidney transplant failure: Secondary | ICD-10-CM | POA: Diagnosis not present

## 2018-05-10 DIAGNOSIS — E119 Type 2 diabetes mellitus without complications: Secondary | ICD-10-CM | POA: Diagnosis not present

## 2018-05-10 DIAGNOSIS — N186 End stage renal disease: Secondary | ICD-10-CM | POA: Diagnosis not present

## 2018-05-10 DIAGNOSIS — E11621 Type 2 diabetes mellitus with foot ulcer: Secondary | ICD-10-CM | POA: Diagnosis not present

## 2018-05-10 DIAGNOSIS — N2581 Secondary hyperparathyroidism of renal origin: Secondary | ICD-10-CM | POA: Diagnosis not present

## 2018-05-13 DIAGNOSIS — E11621 Type 2 diabetes mellitus with foot ulcer: Secondary | ICD-10-CM | POA: Diagnosis not present

## 2018-05-13 DIAGNOSIS — E119 Type 2 diabetes mellitus without complications: Secondary | ICD-10-CM | POA: Diagnosis not present

## 2018-05-13 DIAGNOSIS — N186 End stage renal disease: Secondary | ICD-10-CM | POA: Diagnosis not present

## 2018-05-13 DIAGNOSIS — N2581 Secondary hyperparathyroidism of renal origin: Secondary | ICD-10-CM | POA: Diagnosis not present

## 2018-05-15 DIAGNOSIS — E11621 Type 2 diabetes mellitus with foot ulcer: Secondary | ICD-10-CM | POA: Diagnosis not present

## 2018-05-15 DIAGNOSIS — E119 Type 2 diabetes mellitus without complications: Secondary | ICD-10-CM | POA: Diagnosis not present

## 2018-05-15 DIAGNOSIS — N2581 Secondary hyperparathyroidism of renal origin: Secondary | ICD-10-CM | POA: Diagnosis not present

## 2018-05-15 DIAGNOSIS — N186 End stage renal disease: Secondary | ICD-10-CM | POA: Diagnosis not present

## 2018-05-17 DIAGNOSIS — N186 End stage renal disease: Secondary | ICD-10-CM | POA: Diagnosis not present

## 2018-05-17 DIAGNOSIS — E119 Type 2 diabetes mellitus without complications: Secondary | ICD-10-CM | POA: Diagnosis not present

## 2018-05-17 DIAGNOSIS — E11621 Type 2 diabetes mellitus with foot ulcer: Secondary | ICD-10-CM | POA: Diagnosis not present

## 2018-05-17 DIAGNOSIS — N2581 Secondary hyperparathyroidism of renal origin: Secondary | ICD-10-CM | POA: Diagnosis not present

## 2018-05-20 DIAGNOSIS — E119 Type 2 diabetes mellitus without complications: Secondary | ICD-10-CM | POA: Diagnosis not present

## 2018-05-20 DIAGNOSIS — N186 End stage renal disease: Secondary | ICD-10-CM | POA: Diagnosis not present

## 2018-05-20 DIAGNOSIS — N2581 Secondary hyperparathyroidism of renal origin: Secondary | ICD-10-CM | POA: Diagnosis not present

## 2018-05-20 DIAGNOSIS — E11621 Type 2 diabetes mellitus with foot ulcer: Secondary | ICD-10-CM | POA: Diagnosis not present

## 2018-05-22 DIAGNOSIS — E11621 Type 2 diabetes mellitus with foot ulcer: Secondary | ICD-10-CM | POA: Diagnosis not present

## 2018-05-22 DIAGNOSIS — E119 Type 2 diabetes mellitus without complications: Secondary | ICD-10-CM | POA: Diagnosis not present

## 2018-05-22 DIAGNOSIS — N186 End stage renal disease: Secondary | ICD-10-CM | POA: Diagnosis not present

## 2018-05-22 DIAGNOSIS — N2581 Secondary hyperparathyroidism of renal origin: Secondary | ICD-10-CM | POA: Diagnosis not present

## 2018-05-24 ENCOUNTER — Encounter (HOSPITAL_COMMUNITY): Payer: Self-pay | Admitting: Emergency Medicine

## 2018-05-24 ENCOUNTER — Other Ambulatory Visit: Payer: Self-pay

## 2018-05-24 ENCOUNTER — Ambulatory Visit (HOSPITAL_COMMUNITY)
Admission: EM | Admit: 2018-05-24 | Discharge: 2018-05-24 | Disposition: A | Discharge status: Home / Self Care | Payer: Medicare Other | Attending: Family Medicine | Admitting: Family Medicine

## 2018-05-24 DIAGNOSIS — L97529 Non-pressure chronic ulcer of other part of left foot with unspecified severity: Secondary | ICD-10-CM | POA: Diagnosis not present

## 2018-05-24 DIAGNOSIS — E11621 Type 2 diabetes mellitus with foot ulcer: Secondary | ICD-10-CM | POA: Diagnosis not present

## 2018-05-24 DIAGNOSIS — N2581 Secondary hyperparathyroidism of renal origin: Secondary | ICD-10-CM | POA: Diagnosis not present

## 2018-05-24 DIAGNOSIS — E119 Type 2 diabetes mellitus without complications: Secondary | ICD-10-CM | POA: Diagnosis not present

## 2018-05-24 DIAGNOSIS — N186 End stage renal disease: Secondary | ICD-10-CM | POA: Diagnosis not present

## 2018-05-24 MED ORDER — SILVER SULFADIAZINE 1 % EX CREA: 1.0000 "application " | TOPICAL_CREAM | Freq: Every day | CUTANEOUS | 0 refills | Status: DC

## 2018-05-24 NOTE — Discharge Instructions (Signed)
Use the cream daily.   Call the wound clinic Monday if you feel this wound is getting worse.  Things to look out for: Fevers, increasing pain, drainage (of pus), foul odor, redness.

## 2018-05-24 NOTE — ED Provider Notes (Addendum)
  [place]    CSN: [id number] Arrival date & time: 05/24/18  1650     History    Chief Complaint  Patient presents with  . Wound Check    Nathaniel White is a 58 y.o. male here for a skin complaint.  Duration: 1 month Location: L foot Pruritic? No Painful? Yes Drainage? No New soaps/lotions/topicals/detergents? No Sick contacts? No Other associated symptoms: none Therapies tried thus far: mupirocin Saw wound care team around 1 week ago but thinks it is getting worse. No triggers/cause for this.   ROS:  Const: No fevers Skin: As noted in HPI  Past Medical History:  Diagnosis Date  . Anemia   . Anxiety   . Asthma   . CHF (congestive heart failure) ([place])   . Complication of anesthesia    according to pt and spouse pt was moving around and cough while under and pt had difficulty waking up so the anesthesia had to be reversed. and pt admitted to ICU.   Marland Kitchen COPD (chronic obstructive pulmonary disease) ([place])   . Diabetes mellitus without complication ([place])    not on medications  . ESRD    on HD, M- W- F   . Hemiparesis due to old stroke ([place])    left  . Hypertension   . LV dysfunction    EF 25-30% by echo 07/2011  . Memory loss due to medical condition    due to stroke  . MR (mitral regurgitation)    moderate to severe, echo 07/2011  . Shortness of breath   . Stroke [facility])    TIA's-left sided weakness  . Tobacco abuse     BP (!) 150/71 (BP Location: Right Arm)   Pulse 89   Temp 98.3 F (36.8 C) (Oral)   Resp 18   SpO2 93%  Gen: awake, alert, appearing stated age Lungs: No accessory muscle use Skin: 2 areas of confluence down to dermis on lateral L foot near heel, measuring approx 0.8 cm in diameter and 0.7 cm anterior to posterior respectively. No drainage, erythema, TTP, fluctuance, excoriation Psych: Nml affect and mood, sometimes has difficulty finding words  Final Clinical Impressions(s) / UC Diagnoses   Final diagnoses:  Ulcer of left  foot, unspecified ulcer stage ([place])   Not infected today. Has been using mupirocin 1x/d, will change to Silvadene and have him f/u with wound care team.   Discharge Instructions     Use the cream daily.   Call the wound clinic Monday if you feel this wound is getting worse.  Things to look out for: Fevers, increasing pain, drainage (of pus), foul odor, redness.    ED Prescriptions    Medication Sig Dispense Auth. Provider   silver sulfADIAZINE (SILVADENE) 1 % cream Apply 1 application topically daily. 50 g Shelda Pal, DO     Controlled Substance Prescriptions [place] Controlled Substance Registry consulted? Not Applicable      Shelda Pal, [place] 05/24/18 1736

## 2018-05-24 NOTE — ED Triage Notes (Signed)
Pt here for wound to left foot x 1 month that has had some drainage; pt dialysis pt with last treatment today; pt noted O2 of 93% on RA and placed on 2 L [place]

## 2018-05-27 DIAGNOSIS — E11621 Type 2 diabetes mellitus with foot ulcer: Secondary | ICD-10-CM | POA: Diagnosis not present

## 2018-05-27 DIAGNOSIS — N2581 Secondary hyperparathyroidism of renal origin: Secondary | ICD-10-CM | POA: Diagnosis not present

## 2018-05-27 DIAGNOSIS — E119 Type 2 diabetes mellitus without complications: Secondary | ICD-10-CM | POA: Diagnosis not present

## 2018-05-27 DIAGNOSIS — N186 End stage renal disease: Secondary | ICD-10-CM | POA: Diagnosis not present

## 2018-05-29 DIAGNOSIS — N2581 Secondary hyperparathyroidism of renal origin: Secondary | ICD-10-CM | POA: Diagnosis not present

## 2018-05-29 DIAGNOSIS — E11621 Type 2 diabetes mellitus with foot ulcer: Secondary | ICD-10-CM | POA: Diagnosis not present

## 2018-05-29 DIAGNOSIS — N186 End stage renal disease: Secondary | ICD-10-CM | POA: Diagnosis not present

## 2018-05-29 DIAGNOSIS — E119 Type 2 diabetes mellitus without complications: Secondary | ICD-10-CM | POA: Diagnosis not present

## 2018-05-31 DIAGNOSIS — E119 Type 2 diabetes mellitus without complications: Secondary | ICD-10-CM | POA: Diagnosis not present

## 2018-05-31 DIAGNOSIS — N186 End stage renal disease: Secondary | ICD-10-CM | POA: Diagnosis not present

## 2018-05-31 DIAGNOSIS — N2581 Secondary hyperparathyroidism of renal origin: Secondary | ICD-10-CM | POA: Diagnosis not present

## 2018-05-31 DIAGNOSIS — E11621 Type 2 diabetes mellitus with foot ulcer: Secondary | ICD-10-CM | POA: Diagnosis not present

## 2018-06-03 DIAGNOSIS — E11621 Type 2 diabetes mellitus with foot ulcer: Secondary | ICD-10-CM | POA: Diagnosis not present

## 2018-06-03 DIAGNOSIS — E119 Type 2 diabetes mellitus without complications: Secondary | ICD-10-CM | POA: Diagnosis not present

## 2018-06-03 DIAGNOSIS — N186 End stage renal disease: Secondary | ICD-10-CM | POA: Diagnosis not present

## 2018-06-03 DIAGNOSIS — N2581 Secondary hyperparathyroidism of renal origin: Secondary | ICD-10-CM | POA: Diagnosis not present

## 2018-06-05 ENCOUNTER — Ambulatory Visit (INDEPENDENT_AMBULATORY_CARE_PROVIDER_SITE_OTHER): Payer: Medicare Other | Admitting: Podiatry

## 2018-06-05 ENCOUNTER — Other Ambulatory Visit: Payer: Self-pay

## 2018-06-05 VITALS — Temp 97.9°F

## 2018-06-05 DIAGNOSIS — R600 Localized edema: Secondary | ICD-10-CM | POA: Diagnosis not present

## 2018-06-05 DIAGNOSIS — E11621 Type 2 diabetes mellitus with foot ulcer: Secondary | ICD-10-CM | POA: Diagnosis not present

## 2018-06-05 DIAGNOSIS — I83025 Varicose veins of left lower extremity with ulcer other part of foot: Secondary | ICD-10-CM | POA: Diagnosis not present

## 2018-06-05 DIAGNOSIS — L97521 Non-pressure chronic ulcer of other part of left foot limited to breakdown of skin: Secondary | ICD-10-CM

## 2018-06-05 DIAGNOSIS — N186 End stage renal disease: Secondary | ICD-10-CM | POA: Diagnosis not present

## 2018-06-05 DIAGNOSIS — E119 Type 2 diabetes mellitus without complications: Secondary | ICD-10-CM | POA: Diagnosis not present

## 2018-06-05 DIAGNOSIS — I872 Venous insufficiency (chronic) (peripheral): Secondary | ICD-10-CM

## 2018-06-05 DIAGNOSIS — N2581 Secondary hyperparathyroidism of renal origin: Secondary | ICD-10-CM | POA: Diagnosis not present

## 2018-06-05 NOTE — Progress Notes (Signed)
Subjective:  Patient ID: Nathaniel White, male    DOB: 14-Jun-1960,  MRN: [id number]  Chief Complaint  Patient presents with  . Wound Check    Left foot ulcer check. Pt states he feels as though it is improving. Pt denies fever/nausea/vomiting but states he has been feeling cold for about 29month.    58 y.o. male presents with the above complaint.  History above confirmed with patient  Review of Systems: Negative except as noted in the HPI. Denies N/V/F/Ch.  Past Medical History:  Diagnosis Date  . Anemia   . Anxiety   . Asthma   . CHF (congestive heart failure) ([place])   . Complication of anesthesia    according to pt and spouse pt was moving around and cough while under and pt had difficulty waking up so the anesthesia had to be reversed. and pt admitted to ICU.   Marland Kitchen COPD (chronic obstructive pulmonary disease) ([place])   . Diabetes mellitus without complication ([place])    not on medications  . ESRD    on HD, M- W- F   . Hemiparesis due to old stroke ([place])    left  . Hypertension   . LV dysfunction    EF 25-30% by echo 07/2011  . Memory loss due to medical condition    due to stroke  . MR (mitral regurgitation)    moderate to severe, echo 07/2011  . Shortness of breath   . Stroke [facility])    TIA's-left sided weakness  . Tobacco abuse     Current Outpatient Medications:  .  acetaminophen (TYLENOL) 500 MG tablet, Take 1 tablet (500 mg total) by mouth every 6 (six) hours as needed., Disp: 30 tablet, Rfl: 0 .  albuterol (VENTOLIN HFA) 108 (90 Base) MCG/ACT inhaler, Inhale 1-2 puffs into the lungs every 6 (six) hours as needed for wheezing or shortness of breath., Disp: 1 Inhaler, Rfl: 0 .  cetirizine (ZYRTEC ALLERGY) 10 MG tablet, Take 1 tablet (10 mg total) by mouth daily., Disp: 30 tablet, Rfl: 0 .  clonazePAM (KLONOPIN) 1 MG tablet, 1 mg, Disp: , Rfl:  .  Fluticasone-Salmeterol (ADVAIR) 250-50 MCG/DOSE AEPB, Inhale 1 puff into the lungs 2 (two) times a day., Disp: 60 each, Rfl: 2 .   FOSRENOL 1000 MG chewable tablet, CHEW & SWALLOW 2 TABLETS BY MOUTH WITH MEALS, Disp: , Rfl: 12 .  glucose blood (ONE TOUCH ULTRA TEST) test strip, , Disp: , Rfl:  .  SENSIPAR 30 MG tablet, , Disp: , Rfl:  .  silver sulfADIAZINE (SILVADENE) 1 % cream, Apply 1 application topically daily., Disp: 50 g, Rfl: 0 .  tadalafil (CIALIS) 5 MG tablet, Take 5 mg by mouth daily as needed for erectile dysfunction., Disp: , Rfl:  .  VELPHORO 500 MG chewable tablet, CHEW & SWALLOW 2 TABLETS BY MOUTH THREE TIMES A DAY WITH MEALS, Disp: , Rfl:   Social History   Tobacco Use  Smoking Status Former Smoker  . Packs/day: 0.50  . Years: 32.00  . Pack years: 16.00  . Types: Cigarettes  . Last attempt to quit: 10/13/2012  . Years since quitting: 5.6  Smokeless Tobacco Never Used    Allergies  Allergen Reactions  . Codeine Shortness Of Breath   Objective:   Vitals:   06/05/18 1335  Temp: 97.9 F (36.6 C)   There is no height or weight on file to calculate BMI. Constitutional Well developed. Well nourished.  Vascular Dorsalis pedis pulses palpable bilaterally. Posterior  tibial pulses palpable bilaterally. Venous insufficiency left lower extremity with edema Capillary refill normal to all digits.  No cyanosis or clubbing noted. Pedal hair growth normal.  Neurologic Normal speech. Oriented to person, place, and time. Epicritic sensation to light touch grossly present bilaterally.  Dermatologic Nails normal 2x3 cm ulcer Left lateral heel. Venous insufficiency left leg  Orthopedic: Normal joint ROM without pain or crepitus bilaterally. No visible deformities. No bony tenderness.   Radiographs: Taken and reviewed no underlying osseous abnormalities no acute fractures dislocation Assessment:   1. Venous stasis ulcer of other part of left foot limited to breakdown of skin with varicose veins (HCC)   2. Venous (peripheral) insufficiency   3. Localized edema    Plan:  Patient was evaluated and  treated and all questions answered.  Ulceration left foot with venous stasis, possible venous foot ulcer -Multilayer compression dressing reapplied -Will order [facility]HC for [place] boot twice weekly   Return in about 1 month (around 07/06/2018) for Wound Care, Left.

## 2018-06-05 NOTE — Patient Instructions (Signed)
You will be receiving a call about Nathaniel White

## 2018-06-07 DIAGNOSIS — E11621 Type 2 diabetes mellitus with foot ulcer: Secondary | ICD-10-CM | POA: Diagnosis not present

## 2018-06-07 DIAGNOSIS — N2581 Secondary hyperparathyroidism of renal origin: Secondary | ICD-10-CM | POA: Diagnosis not present

## 2018-06-07 DIAGNOSIS — N186 End stage renal disease: Secondary | ICD-10-CM | POA: Diagnosis not present

## 2018-06-07 DIAGNOSIS — E119 Type 2 diabetes mellitus without complications: Secondary | ICD-10-CM | POA: Diagnosis not present

## 2018-06-09 DIAGNOSIS — T8612 Kidney transplant failure: Secondary | ICD-10-CM | POA: Diagnosis not present

## 2018-06-09 DIAGNOSIS — N186 End stage renal disease: Secondary | ICD-10-CM | POA: Diagnosis not present

## 2018-06-09 DIAGNOSIS — Z992 Dependence on renal dialysis: Secondary | ICD-10-CM | POA: Diagnosis not present

## 2018-06-10 DIAGNOSIS — N186 End stage renal disease: Secondary | ICD-10-CM | POA: Diagnosis not present

## 2018-06-10 DIAGNOSIS — E119 Type 2 diabetes mellitus without complications: Secondary | ICD-10-CM | POA: Diagnosis not present

## 2018-06-10 DIAGNOSIS — N2581 Secondary hyperparathyroidism of renal origin: Secondary | ICD-10-CM | POA: Diagnosis not present

## 2018-06-11 ENCOUNTER — Ambulatory Visit: Payer: Medicare Other | Admitting: Nurse Practitioner

## 2018-06-12 DIAGNOSIS — N186 End stage renal disease: Secondary | ICD-10-CM | POA: Diagnosis not present

## 2018-06-12 DIAGNOSIS — N2581 Secondary hyperparathyroidism of renal origin: Secondary | ICD-10-CM | POA: Diagnosis not present

## 2018-06-12 DIAGNOSIS — E119 Type 2 diabetes mellitus without complications: Secondary | ICD-10-CM | POA: Diagnosis not present

## 2018-06-14 DIAGNOSIS — E119 Type 2 diabetes mellitus without complications: Secondary | ICD-10-CM | POA: Diagnosis not present

## 2018-06-14 DIAGNOSIS — N2581 Secondary hyperparathyroidism of renal origin: Secondary | ICD-10-CM | POA: Diagnosis not present

## 2018-06-14 DIAGNOSIS — N186 End stage renal disease: Secondary | ICD-10-CM | POA: Diagnosis not present

## 2018-06-17 DIAGNOSIS — N2581 Secondary hyperparathyroidism of renal origin: Secondary | ICD-10-CM | POA: Diagnosis not present

## 2018-06-17 DIAGNOSIS — N186 End stage renal disease: Secondary | ICD-10-CM | POA: Diagnosis not present

## 2018-06-17 DIAGNOSIS — E119 Type 2 diabetes mellitus without complications: Secondary | ICD-10-CM | POA: Diagnosis not present

## 2018-06-19 ENCOUNTER — Ambulatory Visit (INDEPENDENT_AMBULATORY_CARE_PROVIDER_SITE_OTHER): Payer: Medicare Other

## 2018-06-19 ENCOUNTER — Other Ambulatory Visit: Payer: Self-pay

## 2018-06-19 ENCOUNTER — Ambulatory Visit (INDEPENDENT_AMBULATORY_CARE_PROVIDER_SITE_OTHER): Payer: Medicare Other | Admitting: Podiatry

## 2018-06-19 ENCOUNTER — Other Ambulatory Visit: Payer: Self-pay | Admitting: Podiatry

## 2018-06-19 DIAGNOSIS — I83025 Varicose veins of left lower extremity with ulcer other part of foot: Secondary | ICD-10-CM

## 2018-06-19 DIAGNOSIS — E119 Type 2 diabetes mellitus without complications: Secondary | ICD-10-CM | POA: Diagnosis not present

## 2018-06-19 DIAGNOSIS — L97521 Non-pressure chronic ulcer of other part of left foot limited to breakdown of skin: Secondary | ICD-10-CM | POA: Diagnosis not present

## 2018-06-19 DIAGNOSIS — R600 Localized edema: Secondary | ICD-10-CM

## 2018-06-19 DIAGNOSIS — L97522 Non-pressure chronic ulcer of other part of left foot with fat layer exposed: Secondary | ICD-10-CM | POA: Diagnosis not present

## 2018-06-19 DIAGNOSIS — I872 Venous insufficiency (chronic) (peripheral): Secondary | ICD-10-CM

## 2018-06-19 DIAGNOSIS — N2581 Secondary hyperparathyroidism of renal origin: Secondary | ICD-10-CM | POA: Diagnosis not present

## 2018-06-19 DIAGNOSIS — N186 End stage renal disease: Secondary | ICD-10-CM | POA: Diagnosis not present

## 2018-06-19 MED ORDER — SILVER SULFADIAZINE 1 % EX CREA: 1.0000 "application " | TOPICAL_CREAM | Freq: Every day | CUTANEOUS | 0 refills | Status: DC

## 2018-06-19 MED ORDER — SILVER SULFADIAZINE 1 % EX CREA: TOPICAL_CREAM | CUTANEOUS | 0 refills | Status: DC

## 2018-06-19 NOTE — Progress Notes (Signed)
Subjective:  Patient ID: Nathaniel White, male    DOB: October 19, 1960,  MRN: [id number]  Chief Complaint  Patient presents with  . Wound Check    Pt states wound on plantar heel has been increasing in size rapidly over the past month.    58 y.o. male presents with the above complaint.  History above confirmed with patient. States the wound has worsened and they are concerned. Denies drainage from the wound.  Review of Systems: Negative except as noted in the HPI. Denies N/V/F/Ch.  Past Medical History:  Diagnosis Date  . Anemia   . Anxiety   . Asthma   . CHF (congestive heart failure) ([place])   . Complication of anesthesia    according to pt and spouse pt was moving around and cough while under and pt had difficulty waking up so the anesthesia had to be reversed. and pt admitted to ICU.   Marland Kitchen COPD (chronic obstructive pulmonary disease) ([place])   . Diabetes mellitus without complication ([place])    not on medications  . ESRD    on HD, M- W- F   . Hemiparesis due to old stroke ([place])    left  . Hypertension   . LV dysfunction    EF 25-30% by echo 07/2011  . Memory loss due to medical condition    due to stroke  . MR (mitral regurgitation)    moderate to severe, echo 07/2011  . Shortness of breath   . Stroke [facility])    TIA's-left sided weakness  . Tobacco abuse     Current Outpatient Medications:  .  acetaminophen (TYLENOL) 500 MG tablet, Take 1 tablet (500 mg total) by mouth every 6 (six) hours as needed., Disp: 30 tablet, Rfl: 0 .  albuterol (VENTOLIN HFA) 108 (90 Base) MCG/ACT inhaler, Inhale 1-2 puffs into the lungs every 6 (six) hours as needed for wheezing or shortness of breath., Disp: 1 Inhaler, Rfl: 0 .  cetirizine (ZYRTEC ALLERGY) 10 MG tablet, Take 1 tablet (10 mg total) by mouth daily., Disp: 30 tablet, Rfl: 0 .  clonazePAM (KLONOPIN) 1 MG tablet, 1 mg, Disp: , Rfl:  .  Fluticasone-Salmeterol (ADVAIR) 250-50 MCG/DOSE AEPB, Inhale 1 puff into the lungs 2 (two) times a day.,  Disp: 60 each, Rfl: 2 .  FOSRENOL 1000 MG chewable tablet, CHEW & SWALLOW 2 TABLETS BY MOUTH WITH MEALS, Disp: , Rfl: 12 .  glucose blood (ONE TOUCH ULTRA TEST) test strip, , Disp: , Rfl:  .  SENSIPAR 30 MG tablet, , Disp: , Rfl:  .  silver sulfADIAZINE (SILVADENE) 1 % cream, Apply topically daily to wound, Disp: 50 g, Rfl: 0 .  tadalafil (CIALIS) 5 MG tablet, Take 5 mg by mouth daily as needed for erectile dysfunction., Disp: , Rfl:  .  VELPHORO 500 MG chewable tablet, CHEW & SWALLOW 2 TABLETS BY MOUTH THREE TIMES A DAY WITH MEALS, Disp: , Rfl:   Social History   Tobacco Use  Smoking Status Former Smoker  . Packs/day: 0.50  . Years: 32.00  . Pack years: 16.00  . Types: Cigarettes  . Quit date: 10/13/2012  . Years since quitting: 5.6  Smokeless Tobacco Never Used    Allergies  Allergen Reactions  . Codeine Shortness Of Breath   Objective:   There were no vitals filed for this visit. There is no height or weight on file to calculate BMI. Constitutional Well developed. Well nourished.  Vascular Dorsalis pedis pulses palpable bilaterally. Posterior tibial pulses palpable  bilaterally. Venous insufficiency left lower extremity with edema Capillary refill normal to all digits.  No cyanosis or clubbing noted. Pedal hair growth normal.  Neurologic Normal speech. Oriented to person, place, and time. Epicritic sensation to light touch grossly present bilaterally.  Dermatologic Nails normal 4x3x0.5 cm ulceration to the left lateral leg. Venous insufficiency left leg  Orthopedic: Normal joint ROM without pain or crepitus bilaterally. No visible deformities. No bony tenderness.   Radiographs: Taken and reviewed no erosions noted or periosteal reaction. Assessment:   1. Venous stasis ulcer of other part of left foot limited to breakdown of skin with varicose veins (HCC)   2. Venous (peripheral) insufficiency   3. Localized edema    Plan:  Patient was evaluated and treated and  all questions answered.  Ulceration left foot with venous stasis, possible venous foot ulcer -Wound worsened today but no bone exposure noted. -XR as above -Silvadene and compression dressing applied to the wound. To be applied daily. -Will refer to wound care center for further care. -Keep current f/u appointment.  No follow-ups on file.

## 2018-06-20 ENCOUNTER — Telehealth: Payer: Self-pay | Admitting: *Deleted

## 2018-06-20 DIAGNOSIS — I872 Venous insufficiency (chronic) (peripheral): Secondary | ICD-10-CM

## 2018-06-20 DIAGNOSIS — I83025 Varicose veins of left lower extremity with ulcer other part of foot: Secondary | ICD-10-CM

## 2018-06-20 DIAGNOSIS — R600 Localized edema: Secondary | ICD-10-CM

## 2018-06-20 NOTE — Telephone Encounter (Signed)
-----   Message from Evelina Bucy, DPM sent at 06/19/2018  5:11 PM EDT ----- Can we refer to the wound care center to be seen within a week if possible?

## 2018-06-20 NOTE — Telephone Encounter (Signed)
Referral form, clinicals and demographics faxed to [facility].

## 2018-06-21 DIAGNOSIS — N2581 Secondary hyperparathyroidism of renal origin: Secondary | ICD-10-CM | POA: Diagnosis not present

## 2018-06-21 DIAGNOSIS — E119 Type 2 diabetes mellitus without complications: Secondary | ICD-10-CM | POA: Diagnosis not present

## 2018-06-21 DIAGNOSIS — N186 End stage renal disease: Secondary | ICD-10-CM | POA: Diagnosis not present

## 2018-06-24 DIAGNOSIS — N186 End stage renal disease: Secondary | ICD-10-CM | POA: Diagnosis not present

## 2018-06-24 DIAGNOSIS — E119 Type 2 diabetes mellitus without complications: Secondary | ICD-10-CM | POA: Diagnosis not present

## 2018-06-24 DIAGNOSIS — N2581 Secondary hyperparathyroidism of renal origin: Secondary | ICD-10-CM | POA: Diagnosis not present

## 2018-06-26 ENCOUNTER — Encounter (HOSPITAL_BASED_OUTPATIENT_CLINIC_OR_DEPARTMENT_OTHER): Payer: Medicare Other

## 2018-06-26 DIAGNOSIS — E119 Type 2 diabetes mellitus without complications: Secondary | ICD-10-CM | POA: Diagnosis not present

## 2018-06-26 DIAGNOSIS — N2581 Secondary hyperparathyroidism of renal origin: Secondary | ICD-10-CM | POA: Diagnosis not present

## 2018-06-26 DIAGNOSIS — N186 End stage renal disease: Secondary | ICD-10-CM | POA: Diagnosis not present

## 2018-06-28 DIAGNOSIS — N186 End stage renal disease: Secondary | ICD-10-CM | POA: Diagnosis not present

## 2018-06-28 DIAGNOSIS — E119 Type 2 diabetes mellitus without complications: Secondary | ICD-10-CM | POA: Diagnosis not present

## 2018-06-28 DIAGNOSIS — N2581 Secondary hyperparathyroidism of renal origin: Secondary | ICD-10-CM | POA: Diagnosis not present

## 2018-06-30 ENCOUNTER — Other Ambulatory Visit: Payer: Self-pay

## 2018-06-30 ENCOUNTER — Encounter (HOSPITAL_BASED_OUTPATIENT_CLINIC_OR_DEPARTMENT_OTHER): Payer: Medicare Other | Attending: Internal Medicine

## 2018-06-30 DIAGNOSIS — E1151 Type 2 diabetes mellitus with diabetic peripheral angiopathy without gangrene: Secondary | ICD-10-CM | POA: Insufficient documentation

## 2018-06-30 DIAGNOSIS — I509 Heart failure, unspecified: Secondary | ICD-10-CM | POA: Diagnosis not present

## 2018-06-30 DIAGNOSIS — N186 End stage renal disease: Secondary | ICD-10-CM | POA: Diagnosis not present

## 2018-06-30 DIAGNOSIS — I129 Hypertensive chronic kidney disease with stage 1 through stage 4 chronic kidney disease, or unspecified chronic kidney disease: Secondary | ICD-10-CM | POA: Diagnosis not present

## 2018-06-30 DIAGNOSIS — J449 Chronic obstructive pulmonary disease, unspecified: Secondary | ICD-10-CM | POA: Diagnosis not present

## 2018-06-30 DIAGNOSIS — L97422 Non-pressure chronic ulcer of left heel and midfoot with fat layer exposed: Secondary | ICD-10-CM | POA: Insufficient documentation

## 2018-06-30 DIAGNOSIS — E1122 Type 2 diabetes mellitus with diabetic chronic kidney disease: Secondary | ICD-10-CM | POA: Insufficient documentation

## 2018-06-30 DIAGNOSIS — Z992 Dependence on renal dialysis: Secondary | ICD-10-CM | POA: Diagnosis not present

## 2018-06-30 DIAGNOSIS — I69354 Hemiplegia and hemiparesis following cerebral infarction affecting left non-dominant side: Secondary | ICD-10-CM | POA: Diagnosis not present

## 2018-06-30 DIAGNOSIS — E11621 Type 2 diabetes mellitus with foot ulcer: Secondary | ICD-10-CM | POA: Insufficient documentation

## 2018-06-30 DIAGNOSIS — I132 Hypertensive heart and chronic kidney disease with heart failure and with stage 5 chronic kidney disease, or end stage renal disease: Secondary | ICD-10-CM | POA: Diagnosis not present

## 2018-06-30 DIAGNOSIS — Z87891 Personal history of nicotine dependence: Secondary | ICD-10-CM | POA: Insufficient documentation

## 2018-06-30 DIAGNOSIS — I428 Other cardiomyopathies: Secondary | ICD-10-CM | POA: Insufficient documentation

## 2018-06-30 DIAGNOSIS — I87332 Chronic venous hypertension (idiopathic) with ulcer and inflammation of left lower extremity: Secondary | ICD-10-CM | POA: Diagnosis not present

## 2018-07-01 DIAGNOSIS — E119 Type 2 diabetes mellitus without complications: Secondary | ICD-10-CM | POA: Diagnosis not present

## 2018-07-01 DIAGNOSIS — N186 End stage renal disease: Secondary | ICD-10-CM | POA: Diagnosis not present

## 2018-07-01 DIAGNOSIS — N2581 Secondary hyperparathyroidism of renal origin: Secondary | ICD-10-CM | POA: Diagnosis not present

## 2018-07-02 DIAGNOSIS — Z9981 Dependence on supplemental oxygen: Secondary | ICD-10-CM | POA: Diagnosis not present

## 2018-07-02 DIAGNOSIS — Z87891 Personal history of nicotine dependence: Secondary | ICD-10-CM | POA: Diagnosis not present

## 2018-07-02 DIAGNOSIS — I420 Dilated cardiomyopathy: Secondary | ICD-10-CM | POA: Diagnosis not present

## 2018-07-02 DIAGNOSIS — I509 Heart failure, unspecified: Secondary | ICD-10-CM | POA: Diagnosis not present

## 2018-07-02 DIAGNOSIS — E1122 Type 2 diabetes mellitus with diabetic chronic kidney disease: Secondary | ICD-10-CM | POA: Diagnosis not present

## 2018-07-02 DIAGNOSIS — I69354 Hemiplegia and hemiparesis following cerebral infarction affecting left non-dominant side: Secondary | ICD-10-CM | POA: Diagnosis not present

## 2018-07-02 DIAGNOSIS — Z992 Dependence on renal dialysis: Secondary | ICD-10-CM | POA: Diagnosis not present

## 2018-07-02 DIAGNOSIS — Z4800 Encounter for change or removal of nonsurgical wound dressing: Secondary | ICD-10-CM | POA: Diagnosis not present

## 2018-07-02 DIAGNOSIS — I132 Hypertensive heart and chronic kidney disease with heart failure and with stage 5 chronic kidney disease, or end stage renal disease: Secondary | ICD-10-CM | POA: Diagnosis not present

## 2018-07-02 DIAGNOSIS — N186 End stage renal disease: Secondary | ICD-10-CM | POA: Diagnosis not present

## 2018-07-02 DIAGNOSIS — L97422 Non-pressure chronic ulcer of left heel and midfoot with fat layer exposed: Secondary | ICD-10-CM | POA: Diagnosis not present

## 2018-07-02 DIAGNOSIS — J449 Chronic obstructive pulmonary disease, unspecified: Secondary | ICD-10-CM | POA: Diagnosis not present

## 2018-07-02 DIAGNOSIS — E11621 Type 2 diabetes mellitus with foot ulcer: Secondary | ICD-10-CM | POA: Diagnosis not present

## 2018-07-02 DIAGNOSIS — E1151 Type 2 diabetes mellitus with diabetic peripheral angiopathy without gangrene: Secondary | ICD-10-CM | POA: Diagnosis not present

## 2018-07-03 DIAGNOSIS — N186 End stage renal disease: Secondary | ICD-10-CM | POA: Diagnosis not present

## 2018-07-03 DIAGNOSIS — N2581 Secondary hyperparathyroidism of renal origin: Secondary | ICD-10-CM | POA: Diagnosis not present

## 2018-07-03 DIAGNOSIS — E119 Type 2 diabetes mellitus without complications: Secondary | ICD-10-CM | POA: Diagnosis not present

## 2018-07-04 DIAGNOSIS — I132 Hypertensive heart and chronic kidney disease with heart failure and with stage 5 chronic kidney disease, or end stage renal disease: Secondary | ICD-10-CM | POA: Diagnosis not present

## 2018-07-04 DIAGNOSIS — L97422 Non-pressure chronic ulcer of left heel and midfoot with fat layer exposed: Secondary | ICD-10-CM | POA: Diagnosis not present

## 2018-07-04 DIAGNOSIS — E1151 Type 2 diabetes mellitus with diabetic peripheral angiopathy without gangrene: Secondary | ICD-10-CM | POA: Diagnosis not present

## 2018-07-04 DIAGNOSIS — Z4800 Encounter for change or removal of nonsurgical wound dressing: Secondary | ICD-10-CM | POA: Diagnosis not present

## 2018-07-04 DIAGNOSIS — E11621 Type 2 diabetes mellitus with foot ulcer: Secondary | ICD-10-CM | POA: Diagnosis not present

## 2018-07-04 DIAGNOSIS — E1122 Type 2 diabetes mellitus with diabetic chronic kidney disease: Secondary | ICD-10-CM | POA: Diagnosis not present

## 2018-07-05 DIAGNOSIS — E119 Type 2 diabetes mellitus without complications: Secondary | ICD-10-CM | POA: Diagnosis not present

## 2018-07-05 DIAGNOSIS — N186 End stage renal disease: Secondary | ICD-10-CM | POA: Diagnosis not present

## 2018-07-05 DIAGNOSIS — N2581 Secondary hyperparathyroidism of renal origin: Secondary | ICD-10-CM | POA: Diagnosis not present

## 2018-07-07 DIAGNOSIS — E11621 Type 2 diabetes mellitus with foot ulcer: Secondary | ICD-10-CM | POA: Diagnosis not present

## 2018-07-07 DIAGNOSIS — L97422 Non-pressure chronic ulcer of left heel and midfoot with fat layer exposed: Secondary | ICD-10-CM | POA: Diagnosis not present

## 2018-07-07 DIAGNOSIS — I87332 Chronic venous hypertension (idiopathic) with ulcer and inflammation of left lower extremity: Secondary | ICD-10-CM | POA: Diagnosis not present

## 2018-07-07 DIAGNOSIS — S91302A Unspecified open wound, left foot, initial encounter: Secondary | ICD-10-CM | POA: Diagnosis not present

## 2018-07-07 DIAGNOSIS — J449 Chronic obstructive pulmonary disease, unspecified: Secondary | ICD-10-CM | POA: Diagnosis not present

## 2018-07-07 DIAGNOSIS — M79672 Pain in left foot: Secondary | ICD-10-CM | POA: Diagnosis not present

## 2018-07-07 DIAGNOSIS — I132 Hypertensive heart and chronic kidney disease with heart failure and with stage 5 chronic kidney disease, or end stage renal disease: Secondary | ICD-10-CM | POA: Diagnosis not present

## 2018-07-07 DIAGNOSIS — E1151 Type 2 diabetes mellitus with diabetic peripheral angiopathy without gangrene: Secondary | ICD-10-CM | POA: Diagnosis not present

## 2018-07-08 ENCOUNTER — Other Ambulatory Visit (HOSPITAL_COMMUNITY): Payer: Self-pay | Admitting: Internal Medicine

## 2018-07-08 DIAGNOSIS — N2581 Secondary hyperparathyroidism of renal origin: Secondary | ICD-10-CM | POA: Diagnosis not present

## 2018-07-08 DIAGNOSIS — L97429 Non-pressure chronic ulcer of left heel and midfoot with unspecified severity: Secondary | ICD-10-CM

## 2018-07-08 DIAGNOSIS — N186 End stage renal disease: Secondary | ICD-10-CM | POA: Diagnosis not present

## 2018-07-08 DIAGNOSIS — E119 Type 2 diabetes mellitus without complications: Secondary | ICD-10-CM | POA: Diagnosis not present

## 2018-07-09 ENCOUNTER — Other Ambulatory Visit (HOSPITAL_COMMUNITY): Payer: Self-pay | Admitting: Internal Medicine

## 2018-07-09 ENCOUNTER — Ambulatory Visit (HOSPITAL_COMMUNITY)
Admission: RE | Admit: 2018-07-09 | Discharge: 2018-07-09 | Disposition: A | Discharge status: Home / Self Care | Payer: Medicare Other | Source: Ambulatory Visit | Attending: Family | Admitting: Family

## 2018-07-09 ENCOUNTER — Ambulatory Visit (INDEPENDENT_AMBULATORY_CARE_PROVIDER_SITE_OTHER)
Admission: RE | Admit: 2018-07-09 | Discharge: 2018-07-09 | Disposition: A | Discharge status: Home / Self Care | Payer: Medicare Other | Source: Ambulatory Visit | Attending: Family | Admitting: Family

## 2018-07-09 ENCOUNTER — Other Ambulatory Visit: Payer: Self-pay

## 2018-07-09 DIAGNOSIS — L97429 Non-pressure chronic ulcer of left heel and midfoot with unspecified severity: Secondary | ICD-10-CM

## 2018-07-09 DIAGNOSIS — T8612 Kidney transplant failure: Secondary | ICD-10-CM | POA: Diagnosis not present

## 2018-07-09 DIAGNOSIS — N186 End stage renal disease: Secondary | ICD-10-CM | POA: Diagnosis not present

## 2018-07-09 DIAGNOSIS — E11621 Type 2 diabetes mellitus with foot ulcer: Secondary | ICD-10-CM | POA: Diagnosis not present

## 2018-07-09 DIAGNOSIS — E1122 Type 2 diabetes mellitus with diabetic chronic kidney disease: Secondary | ICD-10-CM | POA: Diagnosis not present

## 2018-07-09 DIAGNOSIS — Z992 Dependence on renal dialysis: Secondary | ICD-10-CM | POA: Diagnosis not present

## 2018-07-09 DIAGNOSIS — Z4800 Encounter for change or removal of nonsurgical wound dressing: Secondary | ICD-10-CM | POA: Diagnosis not present

## 2018-07-09 DIAGNOSIS — E119 Type 2 diabetes mellitus without complications: Secondary | ICD-10-CM | POA: Diagnosis not present

## 2018-07-09 DIAGNOSIS — I132 Hypertensive heart and chronic kidney disease with heart failure and with stage 5 chronic kidney disease, or end stage renal disease: Secondary | ICD-10-CM | POA: Diagnosis not present

## 2018-07-09 DIAGNOSIS — L97422 Non-pressure chronic ulcer of left heel and midfoot with fat layer exposed: Secondary | ICD-10-CM | POA: Diagnosis not present

## 2018-07-09 DIAGNOSIS — E1151 Type 2 diabetes mellitus with diabetic peripheral angiopathy without gangrene: Secondary | ICD-10-CM | POA: Diagnosis not present

## 2018-07-09 DIAGNOSIS — N2581 Secondary hyperparathyroidism of renal origin: Secondary | ICD-10-CM | POA: Diagnosis not present

## 2018-07-10 ENCOUNTER — Ambulatory Visit (INDEPENDENT_AMBULATORY_CARE_PROVIDER_SITE_OTHER): Payer: Medicare Other | Admitting: Podiatry

## 2018-07-10 ENCOUNTER — Encounter (HOSPITAL_COMMUNITY): Payer: Medicare Other

## 2018-07-10 DIAGNOSIS — N2581 Secondary hyperparathyroidism of renal origin: Secondary | ICD-10-CM | POA: Diagnosis not present

## 2018-07-10 DIAGNOSIS — Z5329 Procedure and treatment not carried out because of patient's decision for other reasons: Secondary | ICD-10-CM

## 2018-07-10 DIAGNOSIS — E119 Type 2 diabetes mellitus without complications: Secondary | ICD-10-CM | POA: Diagnosis not present

## 2018-07-10 DIAGNOSIS — N186 End stage renal disease: Secondary | ICD-10-CM | POA: Diagnosis not present

## 2018-07-10 NOTE — Progress Notes (Signed)
   Complete physical exam  Patient: Nathaniel White   DOB: 10/28/1996   58 y.o. Male  MRN: [id number]  Subjective:    No chief complaint on file.   Nathaniel White is a 58 y.o. male who presents today for a complete physical exam. She reports consuming a {diet types:17450} diet. {types:19826} She generally feels {DESC; WELL/FAIRLY WELL/POORLY:18703}. She reports sleeping {DESC; WELL/FAIRLY WELL/POORLY:18703}. She {does/does not:200015} have additional problems to discuss today.    Most recent fall risk assessment:    07/05/2021   10:42 AM  Fall Risk   Falls in the past year? 0  Number falls in past yr: 0  Injury with Fall? 0  Risk for fall due to : No Fall Risks  Follow up Falls evaluation completed     Most recent depression screenings:    07/05/2021   10:42 AM 05/26/2020   10:46 AM  PHQ 2/9 Scores  PHQ - 2 Score 0 0  PHQ- 9 Score 5     {VISON DENTAL STD PSA (Optional):27386}  {History (Optional):23778}  Patient Care Team: Jessup, Joy, NP as PCP - General (Nurse Practitioner)   Outpatient Medications Prior to Visit  Medication Sig   fluticasone (FLONASE) 50 MCG/ACT nasal spray Place 2 sprays into both nostrils in the morning and at bedtime. After 7 days, reduce to once daily.   norgestimate-ethinyl estradiol (SPRINTEC 28) 0.25-35 MG-MCG tablet Take 1 tablet by mouth daily.   Nystatin POWD Apply liberally to affected area 2 times per day   spironolactone (ALDACTONE) 100 MG tablet Take 1 tablet (100 mg total) by mouth daily.   No facility-administered medications prior to visit.    ROS        Objective:     There were no vitals taken for this visit. {Vitals History (Optional):23777}  Physical Exam   No results found for any visits on 08/10/21. {Show previous labs (optional):23779}    Assessment & Plan:    Routine Health Maintenance and Physical Exam  Immunization History  Administered Date(s) Administered   DTaP 01/11/1999, 03/09/1999,  05/18/1999, 02/01/2000, 08/17/2003   Hepatitis A 06/13/2007, 06/18/2008   Hepatitis B 10/29/1998, 12/06/1998, 05/18/1999   HiB (PRP-OMP) 01/11/1999, 03/09/1999, 05/18/1999, 02/01/2000   IPV 01/11/1999, 03/09/1999, 11/06/1999, 08/17/2003   Influenza,inj,Quad PF,6+ Mos 09/18/2013   Influenza-Unspecified 12/19/2011   MMR 11/05/2000, 08/17/2003   Meningococcal Polysaccharide 06/18/2011   Pneumococcal Conjugate-13 02/01/2000   Pneumococcal-Unspecified 05/18/1999, 08/01/1999   Tdap 06/18/2011   Varicella 11/06/1999, 06/13/2007    Health Maintenance  Topic Date Due   HIV Screening  Never done   Hepatitis C Screening  Never done   INFLUENZA VACCINE  08/08/2021   PAP-Cervical Cytology Screening  08/10/2021 (Originally 10/28/2019)   PAP SMEAR-Modifier  08/10/2021 (Originally 10/28/2019)   TETANUS/TDAP  08/10/2021 (Originally 06/17/2021)   HPV VACCINES  Discontinued   COVID-19 Vaccine  Discontinued    Discussed health benefits of physical activity, and encouraged her to engage in regular exercise appropriate for her age and condition.  Problem List Items Addressed This Visit   None Visit Diagnoses     Annual physical exam    -  Primary   Cervical cancer screening       Need for Tdap vaccination          No follow-ups on file.     Joy Jessup, NP   

## 2018-07-12 DIAGNOSIS — N2581 Secondary hyperparathyroidism of renal origin: Secondary | ICD-10-CM | POA: Diagnosis not present

## 2018-07-12 DIAGNOSIS — N186 End stage renal disease: Secondary | ICD-10-CM | POA: Diagnosis not present

## 2018-07-12 DIAGNOSIS — E119 Type 2 diabetes mellitus without complications: Secondary | ICD-10-CM | POA: Diagnosis not present

## 2018-07-14 ENCOUNTER — Encounter (HOSPITAL_BASED_OUTPATIENT_CLINIC_OR_DEPARTMENT_OTHER): Payer: Medicare Other | Attending: Internal Medicine

## 2018-07-14 DIAGNOSIS — E1122 Type 2 diabetes mellitus with diabetic chronic kidney disease: Secondary | ICD-10-CM | POA: Insufficient documentation

## 2018-07-14 DIAGNOSIS — I96 Gangrene, not elsewhere classified: Secondary | ICD-10-CM | POA: Insufficient documentation

## 2018-07-14 DIAGNOSIS — E11621 Type 2 diabetes mellitus with foot ulcer: Secondary | ICD-10-CM | POA: Insufficient documentation

## 2018-07-14 DIAGNOSIS — L89899 Pressure ulcer of other site, unspecified stage: Secondary | ICD-10-CM | POA: Insufficient documentation

## 2018-07-14 DIAGNOSIS — I509 Heart failure, unspecified: Secondary | ICD-10-CM | POA: Insufficient documentation

## 2018-07-14 DIAGNOSIS — E1152 Type 2 diabetes mellitus with diabetic peripheral angiopathy with gangrene: Secondary | ICD-10-CM | POA: Insufficient documentation

## 2018-07-14 DIAGNOSIS — N186 End stage renal disease: Secondary | ICD-10-CM | POA: Insufficient documentation

## 2018-07-14 DIAGNOSIS — J449 Chronic obstructive pulmonary disease, unspecified: Secondary | ICD-10-CM | POA: Insufficient documentation

## 2018-07-14 DIAGNOSIS — I132 Hypertensive heart and chronic kidney disease with heart failure and with stage 5 chronic kidney disease, or end stage renal disease: Secondary | ICD-10-CM | POA: Insufficient documentation

## 2018-07-14 DIAGNOSIS — L97423 Non-pressure chronic ulcer of left heel and midfoot with necrosis of muscle: Secondary | ICD-10-CM | POA: Insufficient documentation

## 2018-07-14 DIAGNOSIS — L97221 Non-pressure chronic ulcer of left calf limited to breakdown of skin: Secondary | ICD-10-CM | POA: Insufficient documentation

## 2018-07-14 DIAGNOSIS — I87332 Chronic venous hypertension (idiopathic) with ulcer and inflammation of left lower extremity: Secondary | ICD-10-CM | POA: Insufficient documentation

## 2018-07-15 DIAGNOSIS — N186 End stage renal disease: Secondary | ICD-10-CM | POA: Diagnosis not present

## 2018-07-15 DIAGNOSIS — N2581 Secondary hyperparathyroidism of renal origin: Secondary | ICD-10-CM | POA: Diagnosis not present

## 2018-07-15 DIAGNOSIS — E119 Type 2 diabetes mellitus without complications: Secondary | ICD-10-CM | POA: Diagnosis not present

## 2018-07-16 DIAGNOSIS — I132 Hypertensive heart and chronic kidney disease with heart failure and with stage 5 chronic kidney disease, or end stage renal disease: Secondary | ICD-10-CM | POA: Diagnosis not present

## 2018-07-16 DIAGNOSIS — E1122 Type 2 diabetes mellitus with diabetic chronic kidney disease: Secondary | ICD-10-CM | POA: Diagnosis not present

## 2018-07-16 DIAGNOSIS — E1151 Type 2 diabetes mellitus with diabetic peripheral angiopathy without gangrene: Secondary | ICD-10-CM | POA: Diagnosis not present

## 2018-07-16 DIAGNOSIS — Z4800 Encounter for change or removal of nonsurgical wound dressing: Secondary | ICD-10-CM | POA: Diagnosis not present

## 2018-07-16 DIAGNOSIS — L97422 Non-pressure chronic ulcer of left heel and midfoot with fat layer exposed: Secondary | ICD-10-CM | POA: Diagnosis not present

## 2018-07-16 DIAGNOSIS — E11621 Type 2 diabetes mellitus with foot ulcer: Secondary | ICD-10-CM | POA: Diagnosis not present

## 2018-07-17 DIAGNOSIS — I998 Other disorder of circulatory system: Secondary | ICD-10-CM | POA: Diagnosis not present

## 2018-07-17 DIAGNOSIS — I509 Heart failure, unspecified: Secondary | ICD-10-CM | POA: Diagnosis not present

## 2018-07-17 DIAGNOSIS — L89899 Pressure ulcer of other site, unspecified stage: Secondary | ICD-10-CM | POA: Diagnosis not present

## 2018-07-17 DIAGNOSIS — L97422 Non-pressure chronic ulcer of left heel and midfoot with fat layer exposed: Secondary | ICD-10-CM | POA: Diagnosis not present

## 2018-07-17 DIAGNOSIS — Z4800 Encounter for change or removal of nonsurgical wound dressing: Secondary | ICD-10-CM | POA: Diagnosis not present

## 2018-07-17 DIAGNOSIS — I96 Gangrene, not elsewhere classified: Secondary | ICD-10-CM | POA: Diagnosis not present

## 2018-07-17 DIAGNOSIS — I87332 Chronic venous hypertension (idiopathic) with ulcer and inflammation of left lower extremity: Secondary | ICD-10-CM | POA: Diagnosis not present

## 2018-07-17 DIAGNOSIS — E11621 Type 2 diabetes mellitus with foot ulcer: Secondary | ICD-10-CM | POA: Diagnosis not present

## 2018-07-17 DIAGNOSIS — N186 End stage renal disease: Secondary | ICD-10-CM | POA: Diagnosis not present

## 2018-07-17 DIAGNOSIS — L97221 Non-pressure chronic ulcer of left calf limited to breakdown of skin: Secondary | ICD-10-CM | POA: Diagnosis not present

## 2018-07-17 DIAGNOSIS — E1152 Type 2 diabetes mellitus with diabetic peripheral angiopathy with gangrene: Secondary | ICD-10-CM | POA: Diagnosis not present

## 2018-07-17 DIAGNOSIS — E1151 Type 2 diabetes mellitus with diabetic peripheral angiopathy without gangrene: Secondary | ICD-10-CM | POA: Diagnosis not present

## 2018-07-17 DIAGNOSIS — E1122 Type 2 diabetes mellitus with diabetic chronic kidney disease: Secondary | ICD-10-CM | POA: Diagnosis not present

## 2018-07-17 DIAGNOSIS — L97423 Non-pressure chronic ulcer of left heel and midfoot with necrosis of muscle: Secondary | ICD-10-CM | POA: Diagnosis not present

## 2018-07-17 DIAGNOSIS — E119 Type 2 diabetes mellitus without complications: Secondary | ICD-10-CM | POA: Diagnosis not present

## 2018-07-17 DIAGNOSIS — N2581 Secondary hyperparathyroidism of renal origin: Secondary | ICD-10-CM | POA: Diagnosis not present

## 2018-07-17 DIAGNOSIS — I132 Hypertensive heart and chronic kidney disease with heart failure and with stage 5 chronic kidney disease, or end stage renal disease: Secondary | ICD-10-CM | POA: Diagnosis not present

## 2018-07-17 DIAGNOSIS — J449 Chronic obstructive pulmonary disease, unspecified: Secondary | ICD-10-CM | POA: Diagnosis not present

## 2018-07-18 ENCOUNTER — Other Ambulatory Visit: Payer: Self-pay

## 2018-07-18 ENCOUNTER — Encounter: Payer: Self-pay | Admitting: Vascular Surgery

## 2018-07-18 ENCOUNTER — Telehealth: Payer: Self-pay | Admitting: *Deleted

## 2018-07-18 ENCOUNTER — Other Ambulatory Visit: Payer: Self-pay | Admitting: *Deleted

## 2018-07-18 ENCOUNTER — Encounter: Payer: Self-pay | Admitting: *Deleted

## 2018-07-18 ENCOUNTER — Ambulatory Visit (INDEPENDENT_AMBULATORY_CARE_PROVIDER_SITE_OTHER): Payer: Medicare Other | Admitting: Vascular Surgery

## 2018-07-18 VITALS — BP 114/74 | HR 100 | Temp 97.6°F | Resp 20 | Ht 69.0 in | Wt 176.0 lb

## 2018-07-18 DIAGNOSIS — E11621 Type 2 diabetes mellitus with foot ulcer: Secondary | ICD-10-CM | POA: Diagnosis not present

## 2018-07-18 DIAGNOSIS — I739 Peripheral vascular disease, unspecified: Secondary | ICD-10-CM

## 2018-07-18 DIAGNOSIS — Z4800 Encounter for change or removal of nonsurgical wound dressing: Secondary | ICD-10-CM | POA: Diagnosis not present

## 2018-07-18 DIAGNOSIS — E1151 Type 2 diabetes mellitus with diabetic peripheral angiopathy without gangrene: Secondary | ICD-10-CM | POA: Diagnosis not present

## 2018-07-18 DIAGNOSIS — N186 End stage renal disease: Secondary | ICD-10-CM | POA: Diagnosis not present

## 2018-07-18 DIAGNOSIS — L97422 Non-pressure chronic ulcer of left heel and midfoot with fat layer exposed: Secondary | ICD-10-CM | POA: Diagnosis not present

## 2018-07-18 DIAGNOSIS — I132 Hypertensive heart and chronic kidney disease with heart failure and with stage 5 chronic kidney disease, or end stage renal disease: Secondary | ICD-10-CM | POA: Diagnosis not present

## 2018-07-18 DIAGNOSIS — E1122 Type 2 diabetes mellitus with diabetic chronic kidney disease: Secondary | ICD-10-CM | POA: Diagnosis not present

## 2018-07-18 NOTE — Telephone Encounter (Signed)
Nathaniel White states pt's [facility] nurse states she saw pt on Wednesday and performed virtual visit yesterday and pt is complaining of 7 on a pain scale of 10, and she is on her way to see pt now. I asked A. Brumett to advise [facility] nurse I would contact pt.

## 2018-07-18 NOTE — Progress Notes (Signed)
Patient ID: Nathaniel White, male   DOB: [birth year], 58 y.o.   MRN: [id number]  Reason for Consult: New Patient (Initial Visit)   Referred by Ricard Dillon, MD  Subjective:     HPI:  Nathaniel White is a 58 y.o. male history end-stage renal disease on dialysis via left arm AV fistula Tuesday Thursday Saturday.  Has left heel and second toe ulceration.  Denies any fevers or chills.  Has been seen by wound care.  Previous ABIs have been performed.  Past Medical History:  Diagnosis Date  . Anemia   . Anxiety   . Asthma   . CHF (congestive heart failure) ([place])   . Complication of anesthesia    according to pt and spouse pt was moving around and cough while under and pt had difficulty waking up so the anesthesia had to be reversed. and pt admitted to ICU.   Marland Kitchen COPD (chronic obstructive pulmonary disease) ([place])   . Diabetes mellitus without complication ([place])    not on medications  . ESRD    on HD, M- W- F   . Hemiparesis due to old stroke ([place])    left  . Hypertension   . LV dysfunction    EF 25-30% by echo 07/2011  . Memory loss due to medical condition    due to stroke  . MR (mitral regurgitation)    moderate to severe, echo 07/2011  . Shortness of breath   . Stroke [facility])    TIA's-left sided weakness  . Tobacco abuse    Family History  Problem Relation Age of Onset  . Hypertension Mother   . Varicose Veins Mother   . Diabetes Paternal Grandmother   . Cancer Paternal Grandfather   . CAD Paternal Uncle    Past Surgical History:  Procedure Laterality Date  . AV FISTULA PLACEMENT    . CARDIAC CATHETERIZATION     [facility]  . COLONOSCOPY W/ BIOPSIES AND POLYPECTOMY    . FISTULA SUPERFICIALIZATION Left 11/10/2013   Procedure: FISTULA PLICATION;  Surgeon: Serafina Mitchell, MD;  Location: [facility];  Service: Vascular;  Laterality: Left;  . KIDNEY TRANSPLANT     2011 rejected kidney 2012 back on dialysis  . LEFT AND RIGHT HEART CATHETERIZATION WITH CORONARY ANGIOGRAM  N/A 10/09/2013   Procedure: LEFT AND RIGHT HEART CATHETERIZATION WITH CORONARY ANGIOGRAM;  Surgeon: Burnell Blanks, MD;  Location: [facility] CATH LAB;  Service: Cardiovascular;  Laterality: N/A;  . REVISON OF ARTERIOVENOUS FISTULA Left 08/26/2013   Procedure: EXCISION OF ERODED SKIN AND EXPLORATION OF MAIN LEFT UPPER ARM AV FISTULA;  Surgeon: Rosetta Posner, MD;  Location: [facility];  Service: Vascular;  Laterality: Left;  . REVISON OF ARTERIOVENOUS FISTULA Left 02/05/2014   Procedure: REPAIR OF ARTERIOVENOUS FISTULA ANEURYSM;  Surgeon: Serafina Mitchell, MD;  Location: [place];  Service: Vascular;  Laterality: Left;  . SHUNTOGRAM N/A 11/05/2012   Procedure: Fistulogram;  Surgeon: Serafina Mitchell, MD;  Location: [facility] CATH LAB;  Service: Cardiovascular;  Laterality: N/A;  . TEE WITHOUT CARDIOVERSION  08/17/2011   Procedure: TRANSESOPHAGEAL ECHOCARDIOGRAM (TEE);  Surgeon: Larey Dresser, MD;  Location: [facility] ENDOSCOPY;  Service: Cardiovascular;  Laterality: N/A;  . VIDEO ASSISTED THORACOSCOPY (VATS)/DECORTICATION  08/10/11    Short Social History:  Social History   Tobacco Use  . Smoking status: Former Smoker    Packs/day: 0.50    Years: 32.00    Pack years: 16.00    Types: Cigarettes  Quit date: 10/13/2012    Years since quitting: 5.7  . Smokeless tobacco: Never Used  Substance Use Topics  . Alcohol use: No    Alcohol/week: 0.0 standard drinks    Allergies  Allergen Reactions  . Codeine Shortness Of Breath    Current Outpatient Medications  Medication Sig Dispense Refill  . acetaminophen (TYLENOL) 500 MG tablet Take 1 tablet (500 mg total) by mouth every 6 (six) hours as needed. 30 tablet 0  . albuterol (VENTOLIN HFA) 108 (90 Base) MCG/ACT inhaler Inhale 1-2 puffs into the lungs every 6 (six) hours as needed for wheezing or shortness of breath. 1 Inhaler 0  . cetirizine (ZYRTEC ALLERGY) 10 MG tablet Take 1 tablet (10 mg total) by mouth daily. 30 tablet 0  . clonazePAM (KLONOPIN) 1 MG tablet 1 mg     . Fluticasone-Salmeterol (ADVAIR) 250-50 MCG/DOSE AEPB Inhale 1 puff into the lungs 2 (two) times a day. 60 each 2  . FOSRENOL 1000 MG chewable tablet CHEW & SWALLOW 2 TABLETS BY MOUTH WITH MEALS  12  . glucose blood (ONE TOUCH ULTRA TEST) test strip     . SENSIPAR 30 MG tablet     . silver sulfADIAZINE (SILVADENE) 1 % cream Apply topically daily to wound 50 g 0  . tadalafil (CIALIS) 5 MG tablet Take 5 mg by mouth daily as needed for erectile dysfunction.    . VELPHORO 500 MG chewable tablet CHEW & SWALLOW 2 TABLETS BY MOUTH THREE TIMES A DAY WITH MEALS     No current facility-administered medications for this visit.     Review of Systems  Constitutional:  Constitutional negative. HENT: HENT negative.  Eyes: Eyes negative.  Respiratory: Respiratory negative.  Cardiovascular: Cardiovascular negative.  GI: Gastrointestinal negative.  Skin: Positive for wound.  Neurological: Neurological negative. Hematologic: Hematologic/lymphatic negative.        Objective:  Objective   Vitals:   07/18/18 1510  BP: 114/74  Pulse: 100  Resp: 20  Temp: 97.6 F (36.4 C)  SpO2: 98%  Weight: 176 lb (79.8 kg)  Height: 5\' 9"  (1.753 m)   Body mass index is 25.99 kg/m.  Physical Exam HENT:     Head: Normocephalic.  Eyes:     Pupils: Pupils are equal, round, and reactive to light.  Neck:     Musculoskeletal: Normal range of motion.  Cardiovascular:     Rate and Rhythm: Normal rate.     Pulses:          Radial pulses are 0 on the left side.       Femoral pulses are 2+ on the right side and 2+ on the left side. Abdominal:     General: Abdomen is flat.     Palpations: Abdomen is soft.  Musculoskeletal: Normal range of motion.  Skin:    Capillary Refill: Capillary refill takes more than 3 seconds.     Comments: Large area of eschar over lateral left heel dry gangrene left second toe  Neurological:     Mental Status: He is alert.  Psychiatric:        Mood and Affect: Mood normal.         Thought Content: Thought content normal.     Data: I reviewed his previous ABIs which demonstrated a left great toe pressure of 0.  Other ABIs were noncompressible.  By left lower extremity duplex appeared to have occluded popliteal artery.     Assessment/Plan:     58 year old male with history  of left lower extremity wounds.  ABI noncompressible with toe pressure 0.  Appears to have SFA or pop occlusion without popliteal pulse.  Will need to plan angiogram of left lower extremity possible intervention.  I do not think he would be a good candidate for surgical intervention given that I do think he is very high risk for at least below-knee amputation.  He has not walked in a few months secondary to the pain but otherwise was previously walking.  We will get him set up on a nondialysis day in the near future.  I have asked him to start taking aspirin this weekend prior to the procedure.     Waynetta Sandy MD [facility]

## 2018-07-18 NOTE — Telephone Encounter (Signed)
Unable to leave a message pt's voicemail has not been set up.

## 2018-07-18 NOTE — Telephone Encounter (Signed)
I spoke with pt and he complains tightness and burning, unable to sleep at night. Person yelling in the background states pt has hot and cold flashed and get them up at night. I asked pt if he had increase swelling, heat to the leg or foot, red or darkened streaks or drainage. Pt denies and states he is getting ready to go to [facility] today at 3:00pm. I told pt to speak with Dr. Donzetta Matters concerning the pain he was experiencing, this could be pain from edema or varicose veins.

## 2018-07-18 NOTE — Telephone Encounter (Signed)
I would have him discuss with Dr. Donzetta Matters and see what he says. He no showed for his appt with me, he has not been seen in a little bit, and he is currently under care of the wound care center.

## 2018-07-19 DIAGNOSIS — N186 End stage renal disease: Secondary | ICD-10-CM | POA: Diagnosis not present

## 2018-07-19 DIAGNOSIS — E119 Type 2 diabetes mellitus without complications: Secondary | ICD-10-CM | POA: Diagnosis not present

## 2018-07-19 DIAGNOSIS — N2581 Secondary hyperparathyroidism of renal origin: Secondary | ICD-10-CM | POA: Diagnosis not present

## 2018-07-21 ENCOUNTER — Ambulatory Visit (HOSPITAL_COMMUNITY)
Admission: RE | Admit: 2018-07-21 | Discharge: 2018-07-21 | Disposition: A | Discharge status: Home / Self Care | Payer: Medicare Other | Attending: Vascular Surgery | Admitting: Vascular Surgery

## 2018-07-21 ENCOUNTER — Encounter (HOSPITAL_COMMUNITY)
Admission: RE | Disposition: A | Discharge status: Home / Self Care | Payer: Self-pay | Source: Home / Self Care | Attending: Vascular Surgery

## 2018-07-21 ENCOUNTER — Other Ambulatory Visit: Payer: Self-pay

## 2018-07-21 ENCOUNTER — Encounter (HOSPITAL_COMMUNITY): Payer: Self-pay | Admitting: Vascular Surgery

## 2018-07-21 DIAGNOSIS — I132 Hypertensive heart and chronic kidney disease with heart failure and with stage 5 chronic kidney disease, or end stage renal disease: Secondary | ICD-10-CM | POA: Insufficient documentation

## 2018-07-21 DIAGNOSIS — Z1159 Encounter for screening for other viral diseases: Secondary | ICD-10-CM | POA: Diagnosis not present

## 2018-07-21 DIAGNOSIS — L97429 Non-pressure chronic ulcer of left heel and midfoot with unspecified severity: Secondary | ICD-10-CM | POA: Diagnosis not present

## 2018-07-21 DIAGNOSIS — I509 Heart failure, unspecified: Secondary | ICD-10-CM | POA: Insufficient documentation

## 2018-07-21 DIAGNOSIS — Z87891 Personal history of nicotine dependence: Secondary | ICD-10-CM | POA: Diagnosis not present

## 2018-07-21 DIAGNOSIS — I70244 Atherosclerosis of native arteries of left leg with ulceration of heel and midfoot: Secondary | ICD-10-CM | POA: Insufficient documentation

## 2018-07-21 DIAGNOSIS — Z992 Dependence on renal dialysis: Secondary | ICD-10-CM | POA: Insufficient documentation

## 2018-07-21 DIAGNOSIS — L97529 Non-pressure chronic ulcer of other part of left foot with unspecified severity: Secondary | ICD-10-CM | POA: Diagnosis not present

## 2018-07-21 DIAGNOSIS — J449 Chronic obstructive pulmonary disease, unspecified: Secondary | ICD-10-CM | POA: Insufficient documentation

## 2018-07-21 DIAGNOSIS — Z79899 Other long term (current) drug therapy: Secondary | ICD-10-CM | POA: Diagnosis not present

## 2018-07-21 DIAGNOSIS — E1122 Type 2 diabetes mellitus with diabetic chronic kidney disease: Secondary | ICD-10-CM | POA: Insufficient documentation

## 2018-07-21 DIAGNOSIS — N186 End stage renal disease: Secondary | ICD-10-CM | POA: Diagnosis not present

## 2018-07-21 DIAGNOSIS — E1151 Type 2 diabetes mellitus with diabetic peripheral angiopathy without gangrene: Secondary | ICD-10-CM | POA: Diagnosis not present

## 2018-07-21 DIAGNOSIS — Z8673 Personal history of transient ischemic attack (TIA), and cerebral infarction without residual deficits: Secondary | ICD-10-CM | POA: Insufficient documentation

## 2018-07-21 HISTORY — PX: ABDOMINAL AORTOGRAM W/LOWER EXTREMITY: CATH118223

## 2018-07-21 HISTORY — PX: PERIPHERAL VASCULAR INTERVENTION: CATH118257

## 2018-07-21 LAB — POCT I-STAT, CHEM 8
BUN: 50 mg/dL — ABNORMAL HIGH (ref 6–20)
Calcium, Ion: 1.04 mmol/L — ABNORMAL LOW (ref 1.15–1.40)
Chloride: 98 mmol/L (ref 98–111)
Creatinine, Ser: 9.3 mg/dL — ABNORMAL HIGH (ref 0.61–1.24)
Glucose, Bld: 108 mg/dL — ABNORMAL HIGH (ref 70–99)
HCT: 41 % (ref 39.0–52.0)
Hemoglobin: 13.9 g/dL (ref 13.0–17.0)
Potassium: 4.2 mmol/L (ref 3.5–5.1)
Sodium: 137 mmol/L (ref 135–145)
TCO2: 27 mmol/L (ref 22–32)

## 2018-07-21 LAB — SARS CORONAVIRUS 2 BY RT PCR (HOSPITAL ORDER, PERFORMED IN ~~LOC~~ HOSPITAL LAB): SARS Coronavirus 2: NEGATIVE

## 2018-07-21 LAB — GLUCOSE, CAPILLARY: Glucose-Capillary: 108 mg/dL — ABNORMAL HIGH (ref 70–99)

## 2018-07-21 SURGERY — ABDOMINAL AORTOGRAM W/LOWER EXTREMITY
Anesthesia: LOCAL | Site: Abdomen | Laterality: Left

## 2018-07-21 MED ORDER — ONDANSETRON HCL 4 MG/2ML IJ SOLN: 4.0000 mg | Freq: Four times a day (QID) | INTRAMUSCULAR | Status: DC | PRN

## 2018-07-21 MED ORDER — SODIUM CHLORIDE 0.9% FLUSH: 3.0000 mL | INTRAVENOUS | Status: DC | PRN

## 2018-07-21 MED ORDER — SODIUM CHLORIDE 0.9 % IV SOLN: 250.0000 mL | INTRAVENOUS | Status: DC | PRN

## 2018-07-21 MED ORDER — HEPARIN (PORCINE) IN NACL 1000-0.9 UT/500ML-% IV SOLN
INTRAVENOUS | Status: AC
  Filled 2018-07-21: qty 1000

## 2018-07-21 MED ORDER — HEPARIN (PORCINE) IN NACL 1000-0.9 UT/500ML-% IV SOLN
INTRAVENOUS | Status: DC | PRN
  Administered 2018-07-21 (×2): 500 mL

## 2018-07-21 MED ORDER — LIDOCAINE HCL (PF) 1 % IJ SOLN
INTRAMUSCULAR | Status: DC | PRN
  Administered 2018-07-21: 15 mL via INTRADERMAL

## 2018-07-21 MED ORDER — ROSUVASTATIN CALCIUM 10 MG PO TABS: 10.0000 mg | ORAL_TABLET | Freq: Every day | ORAL | 11 refills | Status: DC

## 2018-07-21 MED ORDER — ACETAMINOPHEN 325 MG PO TABS: 650.0000 mg | ORAL_TABLET | ORAL | Status: DC | PRN

## 2018-07-21 MED ORDER — CLOPIDOGREL BISULFATE 300 MG PO TABS
ORAL_TABLET | ORAL | Status: DC | PRN
  Administered 2018-07-21: 300 mg via ORAL

## 2018-07-21 MED ORDER — LIDOCAINE HCL (PF) 1 % IJ SOLN
INTRAMUSCULAR | Status: AC
  Filled 2018-07-21: qty 30

## 2018-07-21 MED ORDER — HYDRALAZINE HCL 20 MG/ML IJ SOLN: 5.0000 mg | INTRAMUSCULAR | Status: DC | PRN

## 2018-07-21 MED ORDER — IODIXANOL 320 MG/ML IV SOLN
INTRAVENOUS | Status: DC | PRN
  Administered 2018-07-21: 115 mL via INTRA_ARTERIAL

## 2018-07-21 MED ORDER — HEPARIN SODIUM (PORCINE) 1000 UNIT/ML IJ SOLN
INTRAMUSCULAR | Status: AC
  Filled 2018-07-21: qty 1

## 2018-07-21 MED ORDER — ROSUVASTATIN CALCIUM 10 MG PO TABS: 10.0000 mg | ORAL_TABLET | Freq: Every day | ORAL | Status: DC

## 2018-07-21 MED ORDER — SODIUM CHLORIDE 0.9% FLUSH: 3.0000 mL | Freq: Two times a day (BID) | INTRAVENOUS | Status: DC

## 2018-07-21 MED ORDER — CLOPIDOGREL BISULFATE 75 MG PO TABS: 75.0000 mg | ORAL_TABLET | Freq: Every day | ORAL | Status: DC

## 2018-07-21 MED ORDER — CLOPIDOGREL BISULFATE 300 MG PO TABS
ORAL_TABLET | ORAL | Status: AC
  Filled 2018-07-21: qty 1

## 2018-07-21 MED ORDER — CLOPIDOGREL BISULFATE 75 MG PO TABS: 75.0000 mg | ORAL_TABLET | Freq: Every day | ORAL | 11 refills | Status: DC

## 2018-07-21 MED ORDER — LABETALOL HCL 5 MG/ML IV SOLN: 10.0000 mg | INTRAVENOUS | Status: DC | PRN

## 2018-07-21 MED ORDER — CLOPIDOGREL BISULFATE 75 MG PO TABS: 300.0000 mg | ORAL_TABLET | Freq: Once | ORAL | Status: DC

## 2018-07-21 MED ORDER — HEPARIN SODIUM (PORCINE) 1000 UNIT/ML IJ SOLN
INTRAMUSCULAR | Status: DC | PRN
  Administered 2018-07-21: 7000 [IU] via INTRAVENOUS

## 2018-07-21 MED ORDER — MORPHINE SULFATE (PF) 10 MG/ML IV SOLN: 2.0000 mg | INTRAVENOUS | Status: DC | PRN

## 2018-07-21 MED FILL — CLOPIDOGREL 75 MG TABLET: 75 | 30 days supply | Qty: 30 | Fill #0

## 2018-07-21 MED FILL — ROSUVASTATIN CALCIUM 10 MG: 10 | 30 days supply | Qty: 30 | Fill #0

## 2018-07-21 SURGICAL SUPPLY — 20 items
BALLN MUSTANG 5.0X40 135 (BALLOONS) ×3
BALLOON MUSTANG 5.0X40 135 (BALLOONS) ×2 IMPLANT
CATH CROSS OVER TEMPO 5F (CATHETERS) ×3 IMPLANT
CATH OMNI FLUSH 5F 65CM (CATHETERS) ×3 IMPLANT
CATH QUICKCROSS SUPP .035X90CM (MICROCATHETER) ×3 IMPLANT
CATH STRAIGHT 5FR 65CM (CATHETERS) ×3 IMPLANT
CLOSURE MYNX CONTROL 6F/7F (Vascular Products) ×3 IMPLANT
GLIDEWIRE ADV .035X260CM (WIRE) ×3 IMPLANT
KIT ENCORE 26 ADVANTAGE (KITS) ×3 IMPLANT
KIT MICROPUNCTURE NIT STIFF (SHEATH) ×3 IMPLANT
KIT PV (KITS) ×3 IMPLANT
SHEATH HIGHFLEX ANSEL 6FRX55 (SHEATH) ×3 IMPLANT
SHEATH PINNACLE 5F 10CM (SHEATH) ×3 IMPLANT
SHEATH PINNACLE 6F 10CM (SHEATH) ×3 IMPLANT
SHEATH PROBE COVER 6X72 (BAG) ×3 IMPLANT
STENT TIGRIS 5X60X120 (Permanent Stent) ×3 IMPLANT
SYR MEDRAD MARK V 150ML (SYRINGE) ×3 IMPLANT
TRANSDUCER W/STOPCOCK (MISCELLANEOUS) ×3 IMPLANT
TRAY PV CATH (CUSTOM PROCEDURE TRAY) ×3 IMPLANT
WIRE BENTSON .035X145CM (WIRE) ×3 IMPLANT

## 2018-07-21 NOTE — H&P (Signed)
   History and Physical Update  The patient was interviewed and re-examined.  The patient's previous History and Physical has been reviewed and is unchanged from recent office visit. Plan for aortogram with possible intervention left sfa/popliteal arteries.  Brandon C. Donzetta Matters, MD [facility] Office: [phone] Pager: [phone]  07/21/2018, 9:50 AM

## 2018-07-21 NOTE — Discharge Instructions (Signed)
Femoral Site Care °This sheet gives you information about how to care for yourself after your procedure. Your health care provider may also give you more specific instructions. If you have problems or questions, contact your health care provider. °What can I expect after the procedure? °After the procedure, it is common to have: °· Bruising that usually fades within 1-2 weeks. °· Tenderness at the site. °Follow these instructions at home: °Wound care °· Follow instructions from your health care provider about how to take care of your insertion site. Make sure you: °? Wash your hands with soap and water before you change your bandage (dressing). If soap and water are not available, use hand sanitizer. °? Change your dressing as told by your health care provider. °? Leave stitches (sutures), skin glue, or adhesive strips in place. These skin closures may need to stay in place for 2 weeks or longer. If adhesive strip edges start to loosen and curl up, you may trim the loose edges. Do not remove adhesive strips completely unless your health care provider tells you to do that. °· Do not take baths, swim, or use a hot tub until your health care provider approves. °· You may shower 24-48 hours after the procedure or as told by your health care provider. °? Gently wash the site with plain soap and water. °? Pat the area dry with a clean towel. °? Do not rub the site. This may cause bleeding. °· Do not apply powder or lotion to the site. Keep the site clean and dry. °· Check your femoral site every day for signs of infection. Check for: °? Redness, swelling, or pain. °? Fluid or blood. °? Warmth. °? Pus or a bad smell. °Activity °· For the first 2-3 days after your procedure, or as long as directed: °? Avoid climbing stairs as much as possible. °? Do not squat. °· Do not lift anything that is heavier than 10 lb (4.5 kg), or the limit that you are told, until your health care provider says that it is safe. °· Rest as  directed. °? Avoid sitting for a long time without moving. Get up to take short walks every 1-2 hours. °· Do not drive for 24 hours if you were given a medicine to help you relax (sedative). °General instructions °· Take over-the-counter and prescription medicines only as told by your health care provider. °· Keep all follow-up visits as told by your health care provider. This is important. °Contact a health care provider if you have: °· A fever or chills. °· You have redness, swelling, or pain around your insertion site. °Get help right away if: °· The catheter insertion area swells very fast. °· You pass out. °· You suddenly start to sweat or your skin gets clammy. °· The catheter insertion area is bleeding, and the bleeding does not stop when you hold steady pressure on the area. °· The area near or just beyond the catheter insertion site becomes pale, cool, tingly, or numb. °These symptoms may represent a serious problem that is an emergency. Do not wait to see if the symptoms will go away. Get medical help right away. Call your local emergency services (911 in the [place] Do not drive yourself to the hospital. °Summary °· After the procedure, it is common to have bruising that usually fades within 1-2 weeks. °· Check your femoral site every day for signs of infection. °· Do not lift anything that is heavier than 10 lb (4.5 kg), or the   limit that you are told, until your health care provider says that it is safe. °This information is not intended to replace advice given to you by your health care provider. Make sure you discuss any questions you have with your health care provider. °Document Released: 08/28/2013 Document Revised: 01/07/2017 Document Reviewed: 01/07/2017 °Elsevier Patient Education © 2020 Else[employer] Inc. ° °

## 2018-07-21 NOTE — Op Note (Signed)
    Patient name: Nathaniel White MRN: [id number] DOB: 1960-06-22 Sex: male  07/21/2018 Pre-operative Diagnosis: End-stage renal disease, critical left lower extremity ischemia Post-operative diagnosis:  Same Surgeon:  Eda Paschal. Donzetta Matters, MD Procedure Performed: 1.  Ultrasound-guided cannulation right common femoral artery 2.  Aortogram with left lower extremity runoff 3.  Stent of left popliteal artery with 6 x 60 mm Tigris 4.  Minx device closure right common femoral artery   Indications: 58 year old male with end-stage renal disease.  He has extensive left lower extremity wound for which he is followed in the wound center.  Duplex demonstrates likely occlusive popliteal lesion with toe pressure of 0.  He is indicated for angiography possible invention on the left.  Findings: His aorta and iliac segments are heavily calcified and tortuous however there is no flow-limiting stenosis.  The SFA has approximately 50% stenosis at the takeoff this was not treated.  Popliteal artery has heavy areas of calcification and occludes in one segment and reconstitutes just beyond this.  Appears to have three-vessel runoff via the tibials which are heavily calcified and the anterior tibial artery is diseased as well but there are no occlusions.  After stenting there is 0% residual stenosis in the popliteal artery.   Procedure:  The patient was identified in the holding area and taken to room 8.  The patient was then placed supine on the table and prepped and draped in the usual sterile fashion.  A time out was called.  Ultrasound was used to evaluate the right common femoral artery which was noted to be patent and compressible.  Areas anesthetized 1% lidocaine cannulated with direct ultrasound visualization with micropuncture needle followed by wire and sheath.  Images saved the permanent record.  A Bentson was wire was placed followed by 5 [place] sheath.  We placed Omni catheter level L1 performed aortogram.  We did  use a crossover catheter to get up and over the bifurcation.  Left lower extremity images were performed.  With above findings we elected to proceed.  A Glidewire advantage was placed to the SFA and a long 6 French sheath was placed.  Patient was fully heparinized.  We crossed the occluded segment with Glidewire advantage and quick cross catheter confirmed intraluminal access.  We primarily stented with Tigris and postdilated with a 5 mm balloon.  Completion demonstrated full opening of the tiger stent there was no residual stenosis.  Satisfied with this we changed for short 6 French sheath and deployed a minx.  He tolerated procedure without any complication  Contrast: 341DQ   Sway Guttierrez C. Donzetta Matters, MD [facility] Office: [phone] Pager: [phone]

## 2018-07-22 DIAGNOSIS — N186 End stage renal disease: Secondary | ICD-10-CM | POA: Diagnosis not present

## 2018-07-22 DIAGNOSIS — E119 Type 2 diabetes mellitus without complications: Secondary | ICD-10-CM | POA: Diagnosis not present

## 2018-07-22 DIAGNOSIS — N2581 Secondary hyperparathyroidism of renal origin: Secondary | ICD-10-CM | POA: Diagnosis not present

## 2018-07-24 DIAGNOSIS — N2581 Secondary hyperparathyroidism of renal origin: Secondary | ICD-10-CM | POA: Diagnosis not present

## 2018-07-24 DIAGNOSIS — N186 End stage renal disease: Secondary | ICD-10-CM | POA: Diagnosis not present

## 2018-07-24 DIAGNOSIS — E119 Type 2 diabetes mellitus without complications: Secondary | ICD-10-CM | POA: Diagnosis not present

## 2018-07-25 ENCOUNTER — Ambulatory Visit (INDEPENDENT_AMBULATORY_CARE_PROVIDER_SITE_OTHER): Payer: Medicare Other | Admitting: Podiatry

## 2018-07-25 DIAGNOSIS — Z5329 Procedure and treatment not carried out because of patient's decision for other reasons: Secondary | ICD-10-CM

## 2018-07-25 DIAGNOSIS — I132 Hypertensive heart and chronic kidney disease with heart failure and with stage 5 chronic kidney disease, or end stage renal disease: Secondary | ICD-10-CM | POA: Diagnosis not present

## 2018-07-25 DIAGNOSIS — E11621 Type 2 diabetes mellitus with foot ulcer: Secondary | ICD-10-CM | POA: Diagnosis not present

## 2018-07-25 DIAGNOSIS — L97422 Non-pressure chronic ulcer of left heel and midfoot with fat layer exposed: Secondary | ICD-10-CM | POA: Diagnosis not present

## 2018-07-25 DIAGNOSIS — Z4800 Encounter for change or removal of nonsurgical wound dressing: Secondary | ICD-10-CM | POA: Diagnosis not present

## 2018-07-25 DIAGNOSIS — E1151 Type 2 diabetes mellitus with diabetic peripheral angiopathy without gangrene: Secondary | ICD-10-CM | POA: Diagnosis not present

## 2018-07-25 DIAGNOSIS — E1122 Type 2 diabetes mellitus with diabetic chronic kidney disease: Secondary | ICD-10-CM | POA: Diagnosis not present

## 2018-07-25 NOTE — Progress Notes (Signed)
   Complete physical exam  Patient: Nathaniel White   DOB: 10/28/1996   58 y.o. Male  MRN: [id number]  Subjective:    No chief complaint on file.   Nathaniel White is a 58 y.o. male who presents today for a complete physical exam. She reports consuming a {diet types:17450} diet. {types:19826} She generally feels {DESC; WELL/FAIRLY WELL/POORLY:18703}. She reports sleeping {DESC; WELL/FAIRLY WELL/POORLY:18703}. She {does/does not:200015} have additional problems to discuss today.    Most recent fall risk assessment:    07/05/2021   10:42 AM  Fall Risk   Falls in the past year? 0  Number falls in past yr: 0  Injury with Fall? 0  Risk for fall due to : No Fall Risks  Follow up Falls evaluation completed     Most recent depression screenings:    07/05/2021   10:42 AM 05/26/2020   10:46 AM  PHQ 2/9 Scores  PHQ - 2 Score 0 0  PHQ- 9 Score 5     {VISON DENTAL STD PSA (Optional):27386}  {History (Optional):23778}  Patient Care Team: Jessup, Joy, NP as PCP - General (Nurse Practitioner)   Outpatient Medications Prior to Visit  Medication Sig   fluticasone (FLONASE) 50 MCG/ACT nasal spray Place 2 sprays into both nostrils in the morning and at bedtime. After 7 days, reduce to once daily.   norgestimate-ethinyl estradiol (SPRINTEC 28) 0.25-35 MG-MCG tablet Take 1 tablet by mouth daily.   Nystatin POWD Apply liberally to affected area 2 times per day   spironolactone (ALDACTONE) 100 MG tablet Take 1 tablet (100 mg total) by mouth daily.   No facility-administered medications prior to visit.    ROS        Objective:     There were no vitals taken for this visit. {Vitals History (Optional):23777}  Physical Exam   No results found for any visits on 08/10/21. {Show previous labs (optional):23779}    Assessment & Plan:    Routine Health Maintenance and Physical Exam  Immunization History  Administered Date(s) Administered   DTaP 01/11/1999, 03/09/1999,  05/18/1999, 02/01/2000, 08/17/2003   Hepatitis A 06/13/2007, 06/18/2008   Hepatitis B 10/29/1998, 12/06/1998, 05/18/1999   HiB (PRP-OMP) 01/11/1999, 03/09/1999, 05/18/1999, 02/01/2000   IPV 01/11/1999, 03/09/1999, 11/06/1999, 08/17/2003   Influenza,inj,Quad PF,6+ Mos 09/18/2013   Influenza-Unspecified 12/19/2011   MMR 11/05/2000, 08/17/2003   Meningococcal Polysaccharide 06/18/2011   Pneumococcal Conjugate-13 02/01/2000   Pneumococcal-Unspecified 05/18/1999, 08/01/1999   Tdap 06/18/2011   Varicella 11/06/1999, 06/13/2007    Health Maintenance  Topic Date Due   HIV Screening  Never done   Hepatitis C Screening  Never done   INFLUENZA VACCINE  08/08/2021   PAP-Cervical Cytology Screening  08/10/2021 (Originally 10/28/2019)   PAP SMEAR-Modifier  08/10/2021 (Originally 10/28/2019)   TETANUS/TDAP  08/10/2021 (Originally 06/17/2021)   HPV VACCINES  Discontinued   COVID-19 Vaccine  Discontinued    Discussed health benefits of physical activity, and encouraged her to engage in regular exercise appropriate for her age and condition.  Problem List Items Addressed This Visit   None Visit Diagnoses     Annual physical exam    -  Primary   Cervical cancer screening       Need for Tdap vaccination          No follow-ups on file.     Joy Jessup, NP   

## 2018-07-26 DIAGNOSIS — N186 End stage renal disease: Secondary | ICD-10-CM | POA: Diagnosis not present

## 2018-07-26 DIAGNOSIS — E119 Type 2 diabetes mellitus without complications: Secondary | ICD-10-CM | POA: Diagnosis not present

## 2018-07-26 DIAGNOSIS — N2581 Secondary hyperparathyroidism of renal origin: Secondary | ICD-10-CM | POA: Diagnosis not present

## 2018-07-28 DIAGNOSIS — E11621 Type 2 diabetes mellitus with foot ulcer: Secondary | ICD-10-CM | POA: Diagnosis not present

## 2018-07-28 DIAGNOSIS — E1151 Type 2 diabetes mellitus with diabetic peripheral angiopathy without gangrene: Secondary | ICD-10-CM | POA: Diagnosis not present

## 2018-07-28 DIAGNOSIS — L97422 Non-pressure chronic ulcer of left heel and midfoot with fat layer exposed: Secondary | ICD-10-CM | POA: Diagnosis not present

## 2018-07-28 DIAGNOSIS — E1122 Type 2 diabetes mellitus with diabetic chronic kidney disease: Secondary | ICD-10-CM | POA: Diagnosis not present

## 2018-07-28 DIAGNOSIS — Z4800 Encounter for change or removal of nonsurgical wound dressing: Secondary | ICD-10-CM | POA: Diagnosis not present

## 2018-07-28 DIAGNOSIS — I132 Hypertensive heart and chronic kidney disease with heart failure and with stage 5 chronic kidney disease, or end stage renal disease: Secondary | ICD-10-CM | POA: Diagnosis not present

## 2018-07-29 DIAGNOSIS — E11621 Type 2 diabetes mellitus with foot ulcer: Secondary | ICD-10-CM | POA: Diagnosis not present

## 2018-07-29 DIAGNOSIS — L97221 Non-pressure chronic ulcer of left calf limited to breakdown of skin: Secondary | ICD-10-CM | POA: Diagnosis not present

## 2018-07-29 DIAGNOSIS — N2581 Secondary hyperparathyroidism of renal origin: Secondary | ICD-10-CM | POA: Diagnosis not present

## 2018-07-29 DIAGNOSIS — L97423 Non-pressure chronic ulcer of left heel and midfoot with necrosis of muscle: Secondary | ICD-10-CM | POA: Diagnosis not present

## 2018-07-29 DIAGNOSIS — S81802A Unspecified open wound, left lower leg, initial encounter: Secondary | ICD-10-CM | POA: Diagnosis not present

## 2018-07-29 DIAGNOSIS — E1152 Type 2 diabetes mellitus with diabetic peripheral angiopathy with gangrene: Secondary | ICD-10-CM | POA: Diagnosis not present

## 2018-07-29 DIAGNOSIS — I87332 Chronic venous hypertension (idiopathic) with ulcer and inflammation of left lower extremity: Secondary | ICD-10-CM | POA: Diagnosis not present

## 2018-07-29 DIAGNOSIS — S91302A Unspecified open wound, left foot, initial encounter: Secondary | ICD-10-CM | POA: Diagnosis not present

## 2018-07-29 DIAGNOSIS — N186 End stage renal disease: Secondary | ICD-10-CM | POA: Diagnosis not present

## 2018-07-29 DIAGNOSIS — E119 Type 2 diabetes mellitus without complications: Secondary | ICD-10-CM | POA: Diagnosis not present

## 2018-07-29 DIAGNOSIS — L89899 Pressure ulcer of other site, unspecified stage: Secondary | ICD-10-CM | POA: Diagnosis not present

## 2018-07-31 DIAGNOSIS — E1122 Type 2 diabetes mellitus with diabetic chronic kidney disease: Secondary | ICD-10-CM | POA: Diagnosis not present

## 2018-07-31 DIAGNOSIS — L97422 Non-pressure chronic ulcer of left heel and midfoot with fat layer exposed: Secondary | ICD-10-CM | POA: Diagnosis not present

## 2018-07-31 DIAGNOSIS — Z4800 Encounter for change or removal of nonsurgical wound dressing: Secondary | ICD-10-CM | POA: Diagnosis not present

## 2018-07-31 DIAGNOSIS — E119 Type 2 diabetes mellitus without complications: Secondary | ICD-10-CM | POA: Diagnosis not present

## 2018-07-31 DIAGNOSIS — I132 Hypertensive heart and chronic kidney disease with heart failure and with stage 5 chronic kidney disease, or end stage renal disease: Secondary | ICD-10-CM | POA: Diagnosis not present

## 2018-07-31 DIAGNOSIS — E11621 Type 2 diabetes mellitus with foot ulcer: Secondary | ICD-10-CM | POA: Diagnosis not present

## 2018-07-31 DIAGNOSIS — E1151 Type 2 diabetes mellitus with diabetic peripheral angiopathy without gangrene: Secondary | ICD-10-CM | POA: Diagnosis not present

## 2018-07-31 DIAGNOSIS — N2581 Secondary hyperparathyroidism of renal origin: Secondary | ICD-10-CM | POA: Diagnosis not present

## 2018-07-31 DIAGNOSIS — N186 End stage renal disease: Secondary | ICD-10-CM | POA: Diagnosis not present

## 2018-08-01 ENCOUNTER — Ambulatory Visit (INDEPENDENT_AMBULATORY_CARE_PROVIDER_SITE_OTHER): Payer: Medicare Other | Admitting: Podiatry

## 2018-08-01 ENCOUNTER — Other Ambulatory Visit: Payer: Self-pay

## 2018-08-01 ENCOUNTER — Ambulatory Visit (INDEPENDENT_AMBULATORY_CARE_PROVIDER_SITE_OTHER): Payer: Medicare Other

## 2018-08-01 VITALS — Temp 98.6°F

## 2018-08-01 DIAGNOSIS — L97422 Non-pressure chronic ulcer of left heel and midfoot with fat layer exposed: Secondary | ICD-10-CM | POA: Diagnosis not present

## 2018-08-01 DIAGNOSIS — E1151 Type 2 diabetes mellitus with diabetic peripheral angiopathy without gangrene: Secondary | ICD-10-CM | POA: Diagnosis not present

## 2018-08-01 DIAGNOSIS — I96 Gangrene, not elsewhere classified: Secondary | ICD-10-CM

## 2018-08-01 DIAGNOSIS — I509 Heart failure, unspecified: Secondary | ICD-10-CM | POA: Diagnosis not present

## 2018-08-01 DIAGNOSIS — Z4800 Encounter for change or removal of nonsurgical wound dressing: Secondary | ICD-10-CM | POA: Diagnosis not present

## 2018-08-01 DIAGNOSIS — L97423 Non-pressure chronic ulcer of left heel and midfoot with necrosis of muscle: Secondary | ICD-10-CM

## 2018-08-01 DIAGNOSIS — I739 Peripheral vascular disease, unspecified: Secondary | ICD-10-CM | POA: Diagnosis not present

## 2018-08-01 DIAGNOSIS — Z9981 Dependence on supplemental oxygen: Secondary | ICD-10-CM | POA: Diagnosis not present

## 2018-08-01 DIAGNOSIS — N186 End stage renal disease: Secondary | ICD-10-CM | POA: Diagnosis not present

## 2018-08-01 DIAGNOSIS — E1122 Type 2 diabetes mellitus with diabetic chronic kidney disease: Secondary | ICD-10-CM | POA: Diagnosis not present

## 2018-08-01 DIAGNOSIS — I420 Dilated cardiomyopathy: Secondary | ICD-10-CM | POA: Diagnosis not present

## 2018-08-01 DIAGNOSIS — M79672 Pain in left foot: Secondary | ICD-10-CM

## 2018-08-01 DIAGNOSIS — Z992 Dependence on renal dialysis: Secondary | ICD-10-CM | POA: Diagnosis not present

## 2018-08-01 DIAGNOSIS — I69354 Hemiplegia and hemiparesis following cerebral infarction affecting left non-dominant side: Secondary | ICD-10-CM | POA: Diagnosis not present

## 2018-08-01 DIAGNOSIS — I132 Hypertensive heart and chronic kidney disease with heart failure and with stage 5 chronic kidney disease, or end stage renal disease: Secondary | ICD-10-CM | POA: Diagnosis not present

## 2018-08-01 DIAGNOSIS — E11621 Type 2 diabetes mellitus with foot ulcer: Secondary | ICD-10-CM | POA: Diagnosis not present

## 2018-08-01 DIAGNOSIS — J449 Chronic obstructive pulmonary disease, unspecified: Secondary | ICD-10-CM | POA: Diagnosis not present

## 2018-08-01 DIAGNOSIS — Z87891 Personal history of nicotine dependence: Secondary | ICD-10-CM | POA: Diagnosis not present

## 2018-08-02 DIAGNOSIS — E119 Type 2 diabetes mellitus without complications: Secondary | ICD-10-CM | POA: Diagnosis not present

## 2018-08-02 DIAGNOSIS — N2581 Secondary hyperparathyroidism of renal origin: Secondary | ICD-10-CM | POA: Diagnosis not present

## 2018-08-02 DIAGNOSIS — N186 End stage renal disease: Secondary | ICD-10-CM | POA: Diagnosis not present

## 2018-08-03 NOTE — Progress Notes (Signed)
Subjective:  Patient ID: Nathaniel White, male    DOB: 07/26/1960,  MRN: [id number]  Chief Complaint  Patient presents with  . Wound Check    Pt states some pus drainage from posterior heel. Pt states left dorsal lots of swelling. Left 2nd toe no drainage. Pt denies fever/nausea/vomiting/chills.    58 y.o. male presents for wound care.  Currently being seen over the wound care center recently underwent vascular intervention for the left lower extremity.  Review of Systems: Negative except as noted in the HPI. Denies N/V/F/Ch.  Past Medical History:  Diagnosis Date  . Anemia   . Anxiety   . Asthma   . CHF (congestive heart failure) ([place])   . Complication of anesthesia    according to pt and spouse pt was moving around and cough while under and pt had difficulty waking up so the anesthesia had to be reversed. and pt admitted to ICU.   Marland Kitchen COPD (chronic obstructive pulmonary disease) ([facility])   . Diabetes mellitus without complication ([place])    not on medications  . ESRD    on HD, M- W- F   . Hemiparesis due to old stroke ([place])    left  . Hypertension   . LV dysfunction    EF 25-30% by echo 07/2011  . Memory loss due to medical condition    due to stroke  . MR (mitral regurgitation)    moderate to severe, echo 07/2011  . Shortness of breath   . Stroke [facility])    TIA's-left sided weakness  . Tobacco abuse     Current Outpatient Medications:  .  acetaminophen (TYLENOL) 500 MG tablet, Take 1 tablet (500 mg total) by mouth every 6 (six) hours as needed., Disp: 30 tablet, Rfl: 0 .  albuterol (VENTOLIN HFA) 108 (90 Base) MCG/ACT inhaler, Inhale 1-2 puffs into the lungs every 6 (six) hours as needed for wheezing or shortness of breath. (Patient taking differently: Inhale 2 puffs into the lungs every 6 (six) hours as needed for wheezing or shortness of breath. ), Disp: 1 Inhaler, Rfl: 0 .  aspirin EC 81 MG tablet, Take 81 mg by mouth 3 (three) times a week., Disp: , Rfl:  .  cetirizine  (ZYRTEC ALLERGY) 10 MG tablet, Take 1 tablet (10 mg total) by mouth daily. (Patient not taking: Reported on 07/21/2018), Disp: 30 tablet, Rfl: 0 .  clonazePAM (KLONOPIN) 1 MG tablet, Take 1 mg by mouth at bedtime as needed for anxiety. , Disp: , Rfl:  .  clopidogrel (PLAVIX) 75 MG tablet, Take 1 tablet (75 mg total) by mouth daily., Disp: 30 tablet, Rfl: 11 .  Fluticasone-Salmeterol (ADVAIR) 250-50 MCG/DOSE AEPB, Inhale 1 puff into the lungs 2 (two) times a day., Disp: 60 each, Rfl: 2 .  FOSRENOL 1000 MG chewable tablet, Chew 1,000 mg by mouth 3 (three) times daily with meals. , Disp: , Rfl: 12 .  glucose blood (ONE TOUCH ULTRA TEST) test strip, , Disp: , Rfl:  .  hydroxypropyl methylcellulose / hypromellose (ISOPTO TEARS / GONIOVISC) 2.5 % ophthalmic solution, Place 1 drop into both eyes as needed for dry eyes., Disp: , Rfl:  .  loperamide (IMODIUM) 2 MG capsule, Take 4 mg by mouth as needed for diarrhea or loose stools., Disp: , Rfl:  .  rosuvastatin (CRESTOR) 10 MG tablet, Take 1 tablet (10 mg total) by mouth at bedtime., Disp: 30 tablet, Rfl: 11 .  SENSIPAR 30 MG tablet, Take 30 mg by mouth 2 (  two) times daily with a meal. , Disp: , Rfl:  .  silver sulfADIAZINE (SILVADENE) 1 % cream, Apply topically daily to wound (Patient not taking: Reported on 07/21/2018), Disp: 50 g, Rfl: 0 .  tadalafil (CIALIS) 5 MG tablet, Take 5 mg by mouth daily as needed for erectile dysfunction., Disp: , Rfl:  .  VELPHORO 500 MG chewable tablet, CRUSH OR CHEW AND SWALLOW 2 TABLETS 3 TIMES A DAY WITH MEALS, Disp: , Rfl:   Social History   Tobacco Use  Smoking Status Former Smoker  . Packs/day: 0.50  . Years: 32.00  . Pack years: 16.00  . Types: Cigarettes  . Quit date: 10/13/2012  . Years since quitting: 5.8  Smokeless Tobacco Never Used    Allergies  Allergen Reactions  . Codeine Shortness Of Breath   Objective:   Vitals:   08/01/18 1340  Temp: 98.6 F (37 C)   There is no height or weight on file to  calculate BMI. Constitutional Well developed. Well nourished.  Vascular Dorsalis pedis pulses palpable bilaterally. Posterior tibial pulses palpable bilaterally. Capillary refill normal to all digits.  No cyanosis or clubbing noted. Pedal hair growth normal.  Neurologic Normal speech. Oriented to person, place, and time. Protective sensation absent  Dermatologic  dry gangrene left second toe.  Large wound left lateral heel with desiccation eschar no warmth no erythema no active drainage no purulence no ascending cellulitis no probe to bone  Orthopedic: No pain to palpation either foot.   Radiographs: Taken and reviewed suboptimal views due to patient inability to position.  No evidence of heel osteomyelitis noted Assessment:   1. Ulcer of heel, left, with necrosis of muscle ([place])   2. Gangrene of toe of left foot ([place])   3. PAD (peripheral artery disease) ([place])    Plan:  Patient was evaluated and treated and all questions answered.  Chronic left heel ulcer, gangene left 2nd toe -Dressed with betadine wet to dry today. -XR taken show no evidence of calcaneal ostoemyelitis -Recommend patient continue follow-up with Dr. Gwenlyn Saran for vascular status and possible amputation of the second toe.  I think he is at risk of below-knee amputation given the extent of the heel ulceration. -Continue follow-up with wound care center for the heel ulcer -At this point the patient is followed by both wound care and vascular I will defer to their management.  I advised the patient that I am here if needed but he should follow-up with these providers as directed.  No follow-ups on file.

## 2018-08-04 ENCOUNTER — Other Ambulatory Visit: Payer: Self-pay | Admitting: Podiatry

## 2018-08-04 DIAGNOSIS — E1122 Type 2 diabetes mellitus with diabetic chronic kidney disease: Secondary | ICD-10-CM | POA: Diagnosis not present

## 2018-08-04 DIAGNOSIS — L97423 Non-pressure chronic ulcer of left heel and midfoot with necrosis of muscle: Secondary | ICD-10-CM

## 2018-08-04 DIAGNOSIS — I96 Gangrene, not elsewhere classified: Secondary | ICD-10-CM

## 2018-08-04 DIAGNOSIS — L97422 Non-pressure chronic ulcer of left heel and midfoot with fat layer exposed: Secondary | ICD-10-CM | POA: Diagnosis not present

## 2018-08-04 DIAGNOSIS — Z4800 Encounter for change or removal of nonsurgical wound dressing: Secondary | ICD-10-CM | POA: Diagnosis not present

## 2018-08-04 DIAGNOSIS — E11621 Type 2 diabetes mellitus with foot ulcer: Secondary | ICD-10-CM | POA: Diagnosis not present

## 2018-08-04 DIAGNOSIS — I132 Hypertensive heart and chronic kidney disease with heart failure and with stage 5 chronic kidney disease, or end stage renal disease: Secondary | ICD-10-CM | POA: Diagnosis not present

## 2018-08-04 DIAGNOSIS — E1151 Type 2 diabetes mellitus with diabetic peripheral angiopathy without gangrene: Secondary | ICD-10-CM | POA: Diagnosis not present

## 2018-08-05 DIAGNOSIS — N186 End stage renal disease: Secondary | ICD-10-CM | POA: Diagnosis not present

## 2018-08-05 DIAGNOSIS — L97221 Non-pressure chronic ulcer of left calf limited to breakdown of skin: Secondary | ICD-10-CM | POA: Diagnosis not present

## 2018-08-05 DIAGNOSIS — E11621 Type 2 diabetes mellitus with foot ulcer: Secondary | ICD-10-CM | POA: Diagnosis not present

## 2018-08-05 DIAGNOSIS — L97423 Non-pressure chronic ulcer of left heel and midfoot with necrosis of muscle: Secondary | ICD-10-CM | POA: Diagnosis not present

## 2018-08-05 DIAGNOSIS — L89899 Pressure ulcer of other site, unspecified stage: Secondary | ICD-10-CM | POA: Diagnosis not present

## 2018-08-05 DIAGNOSIS — I87332 Chronic venous hypertension (idiopathic) with ulcer and inflammation of left lower extremity: Secondary | ICD-10-CM | POA: Diagnosis not present

## 2018-08-05 DIAGNOSIS — E1152 Type 2 diabetes mellitus with diabetic peripheral angiopathy with gangrene: Secondary | ICD-10-CM | POA: Diagnosis not present

## 2018-08-05 DIAGNOSIS — N2581 Secondary hyperparathyroidism of renal origin: Secondary | ICD-10-CM | POA: Diagnosis not present

## 2018-08-05 DIAGNOSIS — L97422 Non-pressure chronic ulcer of left heel and midfoot with fat layer exposed: Secondary | ICD-10-CM | POA: Diagnosis not present

## 2018-08-05 DIAGNOSIS — E119 Type 2 diabetes mellitus without complications: Secondary | ICD-10-CM | POA: Diagnosis not present

## 2018-08-07 DIAGNOSIS — N186 End stage renal disease: Secondary | ICD-10-CM | POA: Diagnosis not present

## 2018-08-07 DIAGNOSIS — I132 Hypertensive heart and chronic kidney disease with heart failure and with stage 5 chronic kidney disease, or end stage renal disease: Secondary | ICD-10-CM | POA: Diagnosis not present

## 2018-08-07 DIAGNOSIS — E119 Type 2 diabetes mellitus without complications: Secondary | ICD-10-CM | POA: Diagnosis not present

## 2018-08-07 DIAGNOSIS — N2581 Secondary hyperparathyroidism of renal origin: Secondary | ICD-10-CM | POA: Diagnosis not present

## 2018-08-07 DIAGNOSIS — E11621 Type 2 diabetes mellitus with foot ulcer: Secondary | ICD-10-CM | POA: Diagnosis not present

## 2018-08-07 DIAGNOSIS — Z4800 Encounter for change or removal of nonsurgical wound dressing: Secondary | ICD-10-CM | POA: Diagnosis not present

## 2018-08-07 DIAGNOSIS — E1122 Type 2 diabetes mellitus with diabetic chronic kidney disease: Secondary | ICD-10-CM | POA: Diagnosis not present

## 2018-08-07 DIAGNOSIS — E1151 Type 2 diabetes mellitus with diabetic peripheral angiopathy without gangrene: Secondary | ICD-10-CM | POA: Diagnosis not present

## 2018-08-07 DIAGNOSIS — L97422 Non-pressure chronic ulcer of left heel and midfoot with fat layer exposed: Secondary | ICD-10-CM | POA: Diagnosis not present

## 2018-08-08 ENCOUNTER — Encounter: Payer: Self-pay | Admitting: Nurse Practitioner

## 2018-08-08 ENCOUNTER — Ambulatory Visit: Payer: Medicare Other | Attending: Nurse Practitioner | Admitting: Nurse Practitioner

## 2018-08-08 ENCOUNTER — Other Ambulatory Visit: Payer: Self-pay

## 2018-08-08 DIAGNOSIS — I110 Hypertensive heart disease with heart failure: Secondary | ICD-10-CM | POA: Diagnosis not present

## 2018-08-08 DIAGNOSIS — Z79899 Other long term (current) drug therapy: Secondary | ICD-10-CM | POA: Diagnosis not present

## 2018-08-08 DIAGNOSIS — Z7951 Long term (current) use of inhaled steroids: Secondary | ICD-10-CM | POA: Insufficient documentation

## 2018-08-08 DIAGNOSIS — I69354 Hemiplegia and hemiparesis following cerebral infarction affecting left non-dominant side: Secondary | ICD-10-CM | POA: Diagnosis not present

## 2018-08-08 DIAGNOSIS — E1152 Type 2 diabetes mellitus with diabetic peripheral angiopathy with gangrene: Secondary | ICD-10-CM | POA: Diagnosis not present

## 2018-08-08 DIAGNOSIS — I132 Hypertensive heart and chronic kidney disease with heart failure and with stage 5 chronic kidney disease, or end stage renal disease: Secondary | ICD-10-CM | POA: Diagnosis not present

## 2018-08-08 DIAGNOSIS — I340 Nonrheumatic mitral (valve) insufficiency: Secondary | ICD-10-CM | POA: Insufficient documentation

## 2018-08-08 DIAGNOSIS — I5022 Chronic systolic (congestive) heart failure: Secondary | ICD-10-CM | POA: Insufficient documentation

## 2018-08-08 DIAGNOSIS — Z940 Kidney transplant status: Secondary | ICD-10-CM | POA: Insufficient documentation

## 2018-08-08 DIAGNOSIS — N186 End stage renal disease: Secondary | ICD-10-CM | POA: Diagnosis not present

## 2018-08-08 DIAGNOSIS — Z8249 Family history of ischemic heart disease and other diseases of the circulatory system: Secondary | ICD-10-CM | POA: Insufficient documentation

## 2018-08-08 DIAGNOSIS — Z8673 Personal history of transient ischemic attack (TIA), and cerebral infarction without residual deficits: Secondary | ICD-10-CM

## 2018-08-08 DIAGNOSIS — Z992 Dependence on renal dialysis: Secondary | ICD-10-CM | POA: Diagnosis not present

## 2018-08-08 DIAGNOSIS — J449 Chronic obstructive pulmonary disease, unspecified: Secondary | ICD-10-CM | POA: Insufficient documentation

## 2018-08-08 DIAGNOSIS — E1122 Type 2 diabetes mellitus with diabetic chronic kidney disease: Secondary | ICD-10-CM | POA: Insufficient documentation

## 2018-08-08 DIAGNOSIS — R413 Other amnesia: Secondary | ICD-10-CM | POA: Insufficient documentation

## 2018-08-08 DIAGNOSIS — Z87891 Personal history of nicotine dependence: Secondary | ICD-10-CM | POA: Diagnosis not present

## 2018-08-08 DIAGNOSIS — Z7689 Persons encountering health services in other specified circumstances: Secondary | ICD-10-CM

## 2018-08-08 DIAGNOSIS — I5040 Unspecified combined systolic (congestive) and diastolic (congestive) heart failure: Secondary | ICD-10-CM | POA: Diagnosis not present

## 2018-08-08 DIAGNOSIS — I10 Essential (primary) hypertension: Secondary | ICD-10-CM

## 2018-08-08 DIAGNOSIS — D631 Anemia in chronic kidney disease: Secondary | ICD-10-CM | POA: Diagnosis not present

## 2018-08-08 DIAGNOSIS — E11621 Type 2 diabetes mellitus with foot ulcer: Secondary | ICD-10-CM | POA: Diagnosis not present

## 2018-08-08 MED ORDER — ALBUTEROL SULFATE HFA 108 (90 BASE) MCG/ACT IN AERS: 1.0000 | INHALATION_SPRAY | Freq: Four times a day (QID) | RESPIRATORY_TRACT | 0 refills | Status: DC | PRN

## 2018-08-08 MED ORDER — FLUTICASONE-SALMETEROL 250-50 MCG/DOSE IN AEPB: 1.0000 | INHALATION_SPRAY | Freq: Two times a day (BID) | RESPIRATORY_TRACT | 2 refills | Status: DC

## 2018-08-08 MED ORDER — ROSUVASTATIN CALCIUM 10 MG PO TABS: 10.0000 mg | ORAL_TABLET | Freq: Every day | ORAL | 3 refills | Status: DC

## 2018-08-08 MED ORDER — MONTELUKAST SODIUM 10 MG PO TABS: 10.0000 mg | ORAL_TABLET | Freq: Every day | ORAL | 3 refills | Status: DC

## 2018-08-08 MED FILL — !ADVAIR 250/50 DISKUS: 250-50 | 30 days supply | Qty: 60 | Fill #0

## 2018-08-08 MED FILL — MONTELUKAST SOD 10 MG TAB: 10 | 30 days supply | Qty: 30 | Fill #0

## 2018-08-08 NOTE — Progress Notes (Signed)
Virtual Visit via Telephone Note Due to national recommendations of social distancing due to [place] 19, telehealth visit is felt to be most appropriate for this patient at this time.  I discussed the limitations, risks, security and privacy concerns of performing an evaluation and management service by telephone and the availability of in person appointments. I also discussed with the patient that there may be a patient responsible charge related to this service. The patient expressed understanding and agreed to proceed.    I connected with Nathaniel White on 08/08/18  at   1:30 PM EDT  EDT by telephone and verified that I am speaking with the correct person using two identifiers.   Consent I discussed the limitations, risks, security and privacy concerns of performing an evaluation and management service by telephone and the availability of in person appointments. I also discussed with the patient that there may be a patient responsible charge related to this service. The patient expressed understanding and agreed to proceed.   Location of Patient: Private Residence   Location of Provider: [place] and [employer] Office    Persons participating in Telemedicine visit: Geryl Rankins FNP-BC [place]    History of Present Illness: Telemedicine visit for: Establish Care  Past medical history of Anemia, Anxiety, Asthma, CHF EF 78-67%, Complication of anesthesia, COPD , ESRD, Hemiparesis due to old stroke [facility]), Essential Hypertension, LV dysfunction, Memory loss due to medical condition, MR (mitral regurgitation)  Has not seen a primary care provider in "years".   In regard to DM diagnosis patient states he had a kidney transplant in 2011 and thereafter was diagnosed with DM. The kidney was rejected in 2012 and he went back on HD. After that he no longer had elevated glucose readings or DM. His HD days are T-Th-Sat.  Smoked ciagrettes for over 30 years. Stopped  smoking 6 years ago.  Checks blood pressure at home with average readings: 130/60-70s.  Foot Ulcer Left heel with necrosis as well as gangrene of the left 2nd toe. Neg for osteomyelitis. He also has PAD and is being followed by vascular. There is a chance he may have to have possible amputation. He states vascular is trying to save his left leg as he is also at risk for BKA. Using a wheelchair for mobility at this time. Receiving wound care at wound care center for left heel dressing changes.    COPD States he has to use oxygen at the HD center. When I asked why he has to use it only at the HD center. He states because he coughs so much. ??? He has been out of his advair inhaler for almost 2 weeks. He also reports using his albuterol inhaler daily in addition to his advair. I have instructed him that I would like for him to see our clinic Pulmonologist to evaluate for poorly controlled COPD.  He does endorse frequent coughing which is relieved by his albuterol and advair inhaler when he has both of them available. I instructed him that he should not be consistently using albuterol every day as it is only meant for back up. He denies any chest pain or fever. Cough is mostly dry but when it is productive there is no purulent colored sputum. \  CHF It does not appear he has seen Cardiology (Dr. Angelena Form) in quite some time. Last EF 2015 was around 40-45%. He also has a history of Mod-Severe MR. Will refer back to cardiology for annual evaluation.  Per last note from Cardiology 09-28-2013 1. Cardiomyopathy: No prior ischemic workup. LVEF 30% in 2013. Echo this week to reassess.  Chest pain: Concerning for unstable angina. Risk factors for CAD include ESRD on HD, DM, tobacco abuse, HTN. Will plan right and left heart cath next week 10/09/13 to exclude CAD and assess pressures. Risks and benefits reviewed with pt. Pre-cath labs after HD on 10/08/13. Mitral regurgitation: Moderate By echo 2013. Repeat echo this  week.  Unfortunately it does not appear that patient followed up with any of these plans.    Past Medical History:  Diagnosis Date  . Anemia   . Anxiety   . Asthma   . CHF (congestive heart failure) ([place])   . Complication of anesthesia    according to pt and spouse pt was moving around and cough while under and pt had difficulty waking up so the anesthesia had to be reversed. and pt admitted to ICU.   Marland Kitchen COPD (chronic obstructive pulmonary disease) ([place])   . ESRD    on HD, M- W- F   . Hemiparesis due to old stroke ([place])    left  . Hypertension   . LV dysfunction    EF 25-30% by echo 07/2011  . Memory loss due to medical condition    due to stroke  . MR (mitral regurgitation)    moderate to severe, echo 07/2011  . Shortness of breath   . Stroke [facility]    TIA's-left sided weakness  . Tobacco abuse     Past Surgical History:  Procedure Laterality Date  . ABDOMINAL AORTOGRAM W/LOWER EXTREMITY Bilateral 07/21/2018   Procedure: ABDOMINAL AORTOGRAM W/LOWER EXTREMITY;  Surgeon: Waynetta Sandy, MD;  Location: [place] [facility];  Service: Cardiovascular;  Laterality: Bilateral;  . AV FISTULA PLACEMENT    . CARDIAC CATHETERIZATION     [place] [facility]  . COLONOSCOPY W/ BIOPSIES AND POLYPECTOMY    . FISTULA SUPERFICIALIZATION Left 11/10/2013   Procedure: FISTULA PLICATION;  Surgeon: Serafina Mitchell, MD;  Location: [place];  Service: Vascular;  Laterality: Left;  . KIDNEY TRANSPLANT     2011 rejected kidney 2012 back on dialysis  . LEFT AND RIGHT HEART CATHETERIZATION WITH CORONARY ANGIOGRAM N/A 10/09/2013   Procedure: LEFT AND RIGHT HEART CATHETERIZATION WITH CORONARY ANGIOGRAM;  Surgeon: Burnell Blanks, MD;  Location: [facility] CATH LAB;  Service: Cardiovascular;  Laterality: N/A;  . PERIPHERAL VASCULAR INTERVENTION Left 07/21/2018   Procedure: PERIPHERAL VASCULAR INTERVENTION;  Surgeon: Waynetta Sandy, MD;  Location: [place] CV LAB;  Service: Cardiovascular;   Laterality: Left;  Popliteal  . REVISON OF ARTERIOVENOUS FISTULA Left 08/26/2013   Procedure: EXCISION OF ERODED SKIN AND EXPLORATION OF MAIN LEFT UPPER ARM AV FISTULA;  Surgeon: Rosetta Posner, MD;  Location: [place];  Service: Vascular;  Laterality: Left;  . REVISON OF ARTERIOVENOUS FISTULA Left 02/05/2014   Procedure: REPAIR OF ARTERIOVENOUS FISTULA ANEURYSM;  Surgeon: Serafina Mitchell, MD;  Location: [place];  Service: Vascular;  Laterality: Left;  . SHUNTOGRAM N/A 11/05/2012   Procedure: Fistulogram;  Surgeon: Serafina Mitchell, MD;  Location: [facility] CATH LAB;  Service: Cardiovascular;  Laterality: N/A;  . TEE WITHOUT CARDIOVERSION  08/17/2011   Procedure: TRANSESOPHAGEAL ECHOCARDIOGRAM (TEE);  Surgeon: Larey Dresser, MD;  Location: [facility] ENDOSCOPY;  Service: Cardiovascular;  Laterality: N/A;  . VIDEO ASSISTED THORACOSCOPY (VATS)/DECORTICATION  08/10/11    Family History  Problem Relation Age of Onset  . Hypertension Mother   . Varicose Veins  Mother   . Diabetes Paternal Grandmother   . Cancer Paternal Grandfather   . CAD Paternal Uncle     Social History   Socioeconomic History  . Marital status: Single    Spouse name: Not on file  . Number of children: 1  . Years of education: Not on file  . Highest education level: Not on file  Occupational History  . Occupation: Geographical information systems officer: DISABLED  Social Needs  . Financial resource strain: Not on file  . Food insecurity    Worry: Not on file    Inability: Not on file  . Transportation needs    Medical: Not on file    Non-medical: Not on file  Tobacco Use  . Smoking status: Former Smoker    Packs/day: 0.50    Years: 32.00    Pack years: 16.00    Types: Cigarettes    Quit date: 10/13/2012    Years since quitting: 5.8  . Smokeless tobacco: Never Used  Substance and Sexual Activity  . Alcohol use: No    Alcohol/week: 0.0 standard drinks  . Drug use: No  . Sexual activity: Not Currently  Lifestyle  . Physical  activity    Days per week: Not on file    Minutes per session: Not on file  . Stress: Not on file  Relationships  . Social Herbalist on phone: Not on file    Gets together: Not on file    Attends religious service: Not on file    Active member of club or organization: Not on file    Attends meetings of clubs or organizations: Not on file    Relationship status: Not on file  Other Topics Concern  . Not on file  Social History Narrative  . Not on file     Observations/Objective: Awake, alert and oriented x 3   Review of Systems  Constitutional: Negative for fever, malaise/fatigue and weight loss.  HENT: Negative.  Negative for nosebleeds.   Eyes: Negative.  Negative for blurred vision, double vision and photophobia.  Respiratory: Positive for cough and shortness of breath. Negative for wheezing.   Cardiovascular: Negative.  Negative for chest pain, palpitations and leg swelling.  Gastrointestinal: Negative for abdominal pain, nausea and vomiting.  Musculoskeletal: Negative.  Negative for myalgias.  Skin:       Foot ulcer  Neurological: Negative.  Negative for dizziness, focal weakness, seizures and headaches.  Psychiatric/Behavioral: Positive for memory loss (due to previous stroke). Negative for suicidal ideas.    Assessment and Plan:  Diagnoses and all orders for this visit:  Encounter to establish care  CHF, systolic dysfunction -     Ambulatory referral to Cardiology -     CMP14+EGFR; Future  Chronic obstructive pulmonary disease, unspecified COPD type ([place]) -     Fluticasone-Salmeterol (ADVAIR) 250-50 MCG/DOSE AEPB; Inhale 1 puff into the lungs 2 (two) times a day. -     montelukast (SINGULAIR) 10 MG tablet; Take 1 tablet (10 mg total) by mouth at bedtime. -     albuterol (VENTOLIN HFA) 108 (90 Base) MCG/ACT inhaler; Inhale 1-2 puffs into the lungs every 6 (six) hours as needed for wheezing or shortness of breath. -     Ambulatory referral to  Pulmonology  ESRD (end stage renal disease) on dialysis ([place]) -     CMP14+EGFR; Future  History of stroke -     rosuvastatin (CRESTOR) 10 MG tablet; Take 1 tablet (  10 mg total) by mouth at bedtime. -     Lipid panel; Future -     CMP14+EGFR; Future  Essential hypertension -     CMP14+EGFR; Future Continue all antihypertensives as prescribed.  Remember to bring in your blood pressure log with you for your follow up appointment.  DASH/Mediterranean Diets are healthier choices for HTN.    Personal history of transient cerebral ischemia -     rosuvastatin (CRESTOR) 10 MG tablet; Take 1 tablet (10 mg total) by mouth at bedtime. -     CMP14+EGFR; Future     Follow Up Instructions Return in about 2 months (around 10/08/2018).     I discussed the assessment and treatment plan with the patient. The patient was provided an opportunity to ask questions and all were answered. The patient agreed with the plan and demonstrated an understanding of the instructions.   The patient was advised to call back or seek an in-person evaluation if the symptoms worsen or if the condition fails to improve as anticipated.  I provided 31 minutes of non-face-to-face time during this encounter including median intraservice time, reviewing previous notes, labs, imaging, medications and explaining diagnosis and management.  Gildardo Pounds, FNP-BC

## 2018-08-09 DIAGNOSIS — Z4800 Encounter for change or removal of nonsurgical wound dressing: Secondary | ICD-10-CM | POA: Diagnosis not present

## 2018-08-09 DIAGNOSIS — N2581 Secondary hyperparathyroidism of renal origin: Secondary | ICD-10-CM | POA: Diagnosis not present

## 2018-08-09 DIAGNOSIS — N186 End stage renal disease: Secondary | ICD-10-CM | POA: Diagnosis not present

## 2018-08-09 DIAGNOSIS — I132 Hypertensive heart and chronic kidney disease with heart failure and with stage 5 chronic kidney disease, or end stage renal disease: Secondary | ICD-10-CM | POA: Diagnosis not present

## 2018-08-09 DIAGNOSIS — L97422 Non-pressure chronic ulcer of left heel and midfoot with fat layer exposed: Secondary | ICD-10-CM | POA: Diagnosis not present

## 2018-08-09 DIAGNOSIS — E1122 Type 2 diabetes mellitus with diabetic chronic kidney disease: Secondary | ICD-10-CM | POA: Diagnosis not present

## 2018-08-09 DIAGNOSIS — E11621 Type 2 diabetes mellitus with foot ulcer: Secondary | ICD-10-CM | POA: Diagnosis not present

## 2018-08-09 DIAGNOSIS — Z992 Dependence on renal dialysis: Secondary | ICD-10-CM | POA: Diagnosis not present

## 2018-08-09 DIAGNOSIS — E119 Type 2 diabetes mellitus without complications: Secondary | ICD-10-CM | POA: Diagnosis not present

## 2018-08-09 DIAGNOSIS — E1151 Type 2 diabetes mellitus with diabetic peripheral angiopathy without gangrene: Secondary | ICD-10-CM | POA: Diagnosis not present

## 2018-08-09 DIAGNOSIS — T8612 Kidney transplant failure: Secondary | ICD-10-CM | POA: Diagnosis not present

## 2018-08-11 DIAGNOSIS — Z4800 Encounter for change or removal of nonsurgical wound dressing: Secondary | ICD-10-CM | POA: Diagnosis not present

## 2018-08-11 DIAGNOSIS — E1122 Type 2 diabetes mellitus with diabetic chronic kidney disease: Secondary | ICD-10-CM | POA: Diagnosis not present

## 2018-08-11 DIAGNOSIS — L97422 Non-pressure chronic ulcer of left heel and midfoot with fat layer exposed: Secondary | ICD-10-CM | POA: Diagnosis not present

## 2018-08-11 DIAGNOSIS — E11621 Type 2 diabetes mellitus with foot ulcer: Secondary | ICD-10-CM | POA: Diagnosis not present

## 2018-08-11 DIAGNOSIS — I132 Hypertensive heart and chronic kidney disease with heart failure and with stage 5 chronic kidney disease, or end stage renal disease: Secondary | ICD-10-CM | POA: Diagnosis not present

## 2018-08-11 DIAGNOSIS — E1151 Type 2 diabetes mellitus with diabetic peripheral angiopathy without gangrene: Secondary | ICD-10-CM | POA: Diagnosis not present

## 2018-08-11 MED FILL — !VENTOLIN HFA INHALER: 108 (90 BAS | 25 days supply | Qty: 18 | Fill #0

## 2018-08-12 ENCOUNTER — Encounter (HOSPITAL_BASED_OUTPATIENT_CLINIC_OR_DEPARTMENT_OTHER): Payer: Medicare Other | Attending: Internal Medicine

## 2018-08-12 DIAGNOSIS — L97522 Non-pressure chronic ulcer of other part of left foot with fat layer exposed: Secondary | ICD-10-CM | POA: Insufficient documentation

## 2018-08-12 DIAGNOSIS — N186 End stage renal disease: Secondary | ICD-10-CM | POA: Insufficient documentation

## 2018-08-12 DIAGNOSIS — I96 Gangrene, not elsewhere classified: Secondary | ICD-10-CM | POA: Diagnosis not present

## 2018-08-12 DIAGNOSIS — E11621 Type 2 diabetes mellitus with foot ulcer: Secondary | ICD-10-CM | POA: Diagnosis not present

## 2018-08-12 DIAGNOSIS — B9620 Unspecified Escherichia coli [E. coli] as the cause of diseases classified elsewhere: Secondary | ICD-10-CM | POA: Diagnosis not present

## 2018-08-12 DIAGNOSIS — I509 Heart failure, unspecified: Secondary | ICD-10-CM | POA: Insufficient documentation

## 2018-08-12 DIAGNOSIS — L97425 Non-pressure chronic ulcer of left heel and midfoot with muscle involvement without evidence of necrosis: Secondary | ICD-10-CM | POA: Diagnosis not present

## 2018-08-12 DIAGNOSIS — L97422 Non-pressure chronic ulcer of left heel and midfoot with fat layer exposed: Secondary | ICD-10-CM | POA: Insufficient documentation

## 2018-08-12 DIAGNOSIS — E1152 Type 2 diabetes mellitus with diabetic peripheral angiopathy with gangrene: Secondary | ICD-10-CM | POA: Diagnosis not present

## 2018-08-12 DIAGNOSIS — Z992 Dependence on renal dialysis: Secondary | ICD-10-CM | POA: Insufficient documentation

## 2018-08-12 DIAGNOSIS — L97222 Non-pressure chronic ulcer of left calf with fat layer exposed: Secondary | ICD-10-CM | POA: Diagnosis not present

## 2018-08-12 DIAGNOSIS — J449 Chronic obstructive pulmonary disease, unspecified: Secondary | ICD-10-CM | POA: Insufficient documentation

## 2018-08-12 DIAGNOSIS — S81802A Unspecified open wound, left lower leg, initial encounter: Secondary | ICD-10-CM | POA: Diagnosis not present

## 2018-08-12 DIAGNOSIS — I132 Hypertensive heart and chronic kidney disease with heart failure and with stage 5 chronic kidney disease, or end stage renal disease: Secondary | ICD-10-CM | POA: Diagnosis not present

## 2018-08-12 DIAGNOSIS — E1122 Type 2 diabetes mellitus with diabetic chronic kidney disease: Secondary | ICD-10-CM | POA: Insufficient documentation

## 2018-08-12 DIAGNOSIS — E119 Type 2 diabetes mellitus without complications: Secondary | ICD-10-CM | POA: Diagnosis not present

## 2018-08-12 DIAGNOSIS — B952 Enterococcus as the cause of diseases classified elsewhere: Secondary | ICD-10-CM | POA: Diagnosis not present

## 2018-08-12 DIAGNOSIS — N2581 Secondary hyperparathyroidism of renal origin: Secondary | ICD-10-CM | POA: Diagnosis not present

## 2018-08-14 DIAGNOSIS — N2581 Secondary hyperparathyroidism of renal origin: Secondary | ICD-10-CM | POA: Diagnosis not present

## 2018-08-14 DIAGNOSIS — Z992 Dependence on renal dialysis: Secondary | ICD-10-CM | POA: Diagnosis not present

## 2018-08-14 DIAGNOSIS — E119 Type 2 diabetes mellitus without complications: Secondary | ICD-10-CM | POA: Diagnosis not present

## 2018-08-14 DIAGNOSIS — N186 End stage renal disease: Secondary | ICD-10-CM | POA: Diagnosis not present

## 2018-08-15 DIAGNOSIS — L97422 Non-pressure chronic ulcer of left heel and midfoot with fat layer exposed: Secondary | ICD-10-CM | POA: Diagnosis not present

## 2018-08-15 DIAGNOSIS — I132 Hypertensive heart and chronic kidney disease with heart failure and with stage 5 chronic kidney disease, or end stage renal disease: Secondary | ICD-10-CM | POA: Diagnosis not present

## 2018-08-15 DIAGNOSIS — E1151 Type 2 diabetes mellitus with diabetic peripheral angiopathy without gangrene: Secondary | ICD-10-CM | POA: Diagnosis not present

## 2018-08-15 DIAGNOSIS — E1122 Type 2 diabetes mellitus with diabetic chronic kidney disease: Secondary | ICD-10-CM | POA: Diagnosis not present

## 2018-08-15 DIAGNOSIS — Z4800 Encounter for change or removal of nonsurgical wound dressing: Secondary | ICD-10-CM | POA: Diagnosis not present

## 2018-08-15 DIAGNOSIS — E11621 Type 2 diabetes mellitus with foot ulcer: Secondary | ICD-10-CM | POA: Diagnosis not present

## 2018-08-16 DIAGNOSIS — Z992 Dependence on renal dialysis: Secondary | ICD-10-CM | POA: Diagnosis not present

## 2018-08-16 DIAGNOSIS — N186 End stage renal disease: Secondary | ICD-10-CM | POA: Diagnosis not present

## 2018-08-16 DIAGNOSIS — N2581 Secondary hyperparathyroidism of renal origin: Secondary | ICD-10-CM | POA: Diagnosis not present

## 2018-08-16 DIAGNOSIS — E119 Type 2 diabetes mellitus without complications: Secondary | ICD-10-CM | POA: Diagnosis not present

## 2018-08-18 ENCOUNTER — Other Ambulatory Visit: Payer: Self-pay

## 2018-08-18 DIAGNOSIS — I739 Peripheral vascular disease, unspecified: Secondary | ICD-10-CM

## 2018-08-19 DIAGNOSIS — N2581 Secondary hyperparathyroidism of renal origin: Secondary | ICD-10-CM | POA: Diagnosis not present

## 2018-08-19 DIAGNOSIS — E1152 Type 2 diabetes mellitus with diabetic peripheral angiopathy with gangrene: Secondary | ICD-10-CM | POA: Diagnosis not present

## 2018-08-19 DIAGNOSIS — S91002A Unspecified open wound, left ankle, initial encounter: Secondary | ICD-10-CM | POA: Diagnosis not present

## 2018-08-19 DIAGNOSIS — L97422 Non-pressure chronic ulcer of left heel and midfoot with fat layer exposed: Secondary | ICD-10-CM | POA: Diagnosis not present

## 2018-08-19 DIAGNOSIS — L97425 Non-pressure chronic ulcer of left heel and midfoot with muscle involvement without evidence of necrosis: Secondary | ICD-10-CM | POA: Diagnosis not present

## 2018-08-19 DIAGNOSIS — Z992 Dependence on renal dialysis: Secondary | ICD-10-CM | POA: Diagnosis not present

## 2018-08-19 DIAGNOSIS — E11621 Type 2 diabetes mellitus with foot ulcer: Secondary | ICD-10-CM | POA: Diagnosis not present

## 2018-08-19 DIAGNOSIS — I96 Gangrene, not elsewhere classified: Secondary | ICD-10-CM | POA: Diagnosis not present

## 2018-08-19 DIAGNOSIS — L97522 Non-pressure chronic ulcer of other part of left foot with fat layer exposed: Secondary | ICD-10-CM | POA: Diagnosis not present

## 2018-08-19 DIAGNOSIS — L89899 Pressure ulcer of other site, unspecified stage: Secondary | ICD-10-CM | POA: Diagnosis not present

## 2018-08-19 DIAGNOSIS — E119 Type 2 diabetes mellitus without complications: Secondary | ICD-10-CM | POA: Diagnosis not present

## 2018-08-19 DIAGNOSIS — L97222 Non-pressure chronic ulcer of left calf with fat layer exposed: Secondary | ICD-10-CM | POA: Diagnosis not present

## 2018-08-19 DIAGNOSIS — N186 End stage renal disease: Secondary | ICD-10-CM | POA: Diagnosis not present

## 2018-08-20 ENCOUNTER — Other Ambulatory Visit (HOSPITAL_COMMUNITY)
Admission: RE | Admit: 2018-08-20 | Discharge: 2018-08-20 | Disposition: A | Discharge status: Home / Self Care | Payer: Medicare Other | Source: Ambulatory Visit | Attending: Unknown Physician Specialty | Admitting: Unknown Physician Specialty

## 2018-08-20 ENCOUNTER — Telehealth: Payer: Self-pay | Admitting: *Deleted

## 2018-08-20 DIAGNOSIS — E1122 Type 2 diabetes mellitus with diabetic chronic kidney disease: Secondary | ICD-10-CM | POA: Diagnosis not present

## 2018-08-20 DIAGNOSIS — Z4800 Encounter for change or removal of nonsurgical wound dressing: Secondary | ICD-10-CM | POA: Diagnosis not present

## 2018-08-20 DIAGNOSIS — L97422 Non-pressure chronic ulcer of left heel and midfoot with fat layer exposed: Secondary | ICD-10-CM | POA: Diagnosis not present

## 2018-08-20 DIAGNOSIS — E11621 Type 2 diabetes mellitus with foot ulcer: Secondary | ICD-10-CM | POA: Diagnosis not present

## 2018-08-20 DIAGNOSIS — E1151 Type 2 diabetes mellitus with diabetic peripheral angiopathy without gangrene: Secondary | ICD-10-CM | POA: Diagnosis not present

## 2018-08-20 DIAGNOSIS — I132 Hypertensive heart and chronic kidney disease with heart failure and with stage 5 chronic kidney disease, or end stage renal disease: Secondary | ICD-10-CM | POA: Diagnosis not present

## 2018-08-21 ENCOUNTER — Ambulatory Visit: Payer: Medicare Other | Admitting: Family

## 2018-08-21 DIAGNOSIS — N2581 Secondary hyperparathyroidism of renal origin: Secondary | ICD-10-CM | POA: Diagnosis not present

## 2018-08-21 DIAGNOSIS — E119 Type 2 diabetes mellitus without complications: Secondary | ICD-10-CM | POA: Diagnosis not present

## 2018-08-21 DIAGNOSIS — Z992 Dependence on renal dialysis: Secondary | ICD-10-CM | POA: Diagnosis not present

## 2018-08-21 DIAGNOSIS — N186 End stage renal disease: Secondary | ICD-10-CM | POA: Diagnosis not present

## 2018-08-22 ENCOUNTER — Encounter (HOSPITAL_COMMUNITY): Payer: Medicare Other

## 2018-08-22 ENCOUNTER — Ambulatory Visit: Payer: Medicare Other

## 2018-08-22 ENCOUNTER — Ambulatory Visit (HOSPITAL_COMMUNITY): Admission: RE | Admit: 2018-08-22 | Payer: Medicare Other | Source: Ambulatory Visit

## 2018-08-22 DIAGNOSIS — I132 Hypertensive heart and chronic kidney disease with heart failure and with stage 5 chronic kidney disease, or end stage renal disease: Secondary | ICD-10-CM | POA: Diagnosis not present

## 2018-08-22 DIAGNOSIS — E1122 Type 2 diabetes mellitus with diabetic chronic kidney disease: Secondary | ICD-10-CM | POA: Diagnosis not present

## 2018-08-22 DIAGNOSIS — E11621 Type 2 diabetes mellitus with foot ulcer: Secondary | ICD-10-CM | POA: Diagnosis not present

## 2018-08-22 DIAGNOSIS — E1151 Type 2 diabetes mellitus with diabetic peripheral angiopathy without gangrene: Secondary | ICD-10-CM | POA: Diagnosis not present

## 2018-08-22 DIAGNOSIS — L97422 Non-pressure chronic ulcer of left heel and midfoot with fat layer exposed: Secondary | ICD-10-CM | POA: Diagnosis not present

## 2018-08-22 DIAGNOSIS — Z4800 Encounter for change or removal of nonsurgical wound dressing: Secondary | ICD-10-CM | POA: Diagnosis not present

## 2018-08-22 LAB — AEROBIC CULTURE W GRAM STAIN (SUPERFICIAL SPECIMEN)

## 2018-08-23 DIAGNOSIS — N2581 Secondary hyperparathyroidism of renal origin: Secondary | ICD-10-CM | POA: Diagnosis not present

## 2018-08-23 DIAGNOSIS — E119 Type 2 diabetes mellitus without complications: Secondary | ICD-10-CM | POA: Diagnosis not present

## 2018-08-23 DIAGNOSIS — N186 End stage renal disease: Secondary | ICD-10-CM | POA: Diagnosis not present

## 2018-08-23 DIAGNOSIS — Z992 Dependence on renal dialysis: Secondary | ICD-10-CM | POA: Diagnosis not present

## 2018-08-26 DIAGNOSIS — Z992 Dependence on renal dialysis: Secondary | ICD-10-CM | POA: Diagnosis not present

## 2018-08-26 DIAGNOSIS — I96 Gangrene, not elsewhere classified: Secondary | ICD-10-CM | POA: Diagnosis not present

## 2018-08-26 DIAGNOSIS — L97222 Non-pressure chronic ulcer of left calf with fat layer exposed: Secondary | ICD-10-CM | POA: Diagnosis not present

## 2018-08-26 DIAGNOSIS — L97522 Non-pressure chronic ulcer of other part of left foot with fat layer exposed: Secondary | ICD-10-CM | POA: Diagnosis not present

## 2018-08-26 DIAGNOSIS — N2581 Secondary hyperparathyroidism of renal origin: Secondary | ICD-10-CM | POA: Diagnosis not present

## 2018-08-26 DIAGNOSIS — S81802A Unspecified open wound, left lower leg, initial encounter: Secondary | ICD-10-CM | POA: Diagnosis not present

## 2018-08-26 DIAGNOSIS — L97422 Non-pressure chronic ulcer of left heel and midfoot with fat layer exposed: Secondary | ICD-10-CM | POA: Diagnosis not present

## 2018-08-26 DIAGNOSIS — L97425 Non-pressure chronic ulcer of left heel and midfoot with muscle involvement without evidence of necrosis: Secondary | ICD-10-CM | POA: Diagnosis not present

## 2018-08-26 DIAGNOSIS — L89899 Pressure ulcer of other site, unspecified stage: Secondary | ICD-10-CM | POA: Diagnosis not present

## 2018-08-26 DIAGNOSIS — E119 Type 2 diabetes mellitus without complications: Secondary | ICD-10-CM | POA: Diagnosis not present

## 2018-08-26 DIAGNOSIS — N186 End stage renal disease: Secondary | ICD-10-CM | POA: Diagnosis not present

## 2018-08-26 DIAGNOSIS — E1152 Type 2 diabetes mellitus with diabetic peripheral angiopathy with gangrene: Secondary | ICD-10-CM | POA: Diagnosis not present

## 2018-08-26 DIAGNOSIS — E11621 Type 2 diabetes mellitus with foot ulcer: Secondary | ICD-10-CM | POA: Diagnosis not present

## 2018-08-26 DIAGNOSIS — B952 Enterococcus as the cause of diseases classified elsewhere: Secondary | ICD-10-CM | POA: Diagnosis not present

## 2018-08-27 DIAGNOSIS — L97422 Non-pressure chronic ulcer of left heel and midfoot with fat layer exposed: Secondary | ICD-10-CM | POA: Diagnosis not present

## 2018-08-27 DIAGNOSIS — E11621 Type 2 diabetes mellitus with foot ulcer: Secondary | ICD-10-CM | POA: Diagnosis not present

## 2018-08-27 DIAGNOSIS — Z4800 Encounter for change or removal of nonsurgical wound dressing: Secondary | ICD-10-CM | POA: Diagnosis not present

## 2018-08-27 DIAGNOSIS — E1122 Type 2 diabetes mellitus with diabetic chronic kidney disease: Secondary | ICD-10-CM | POA: Diagnosis not present

## 2018-08-27 DIAGNOSIS — I132 Hypertensive heart and chronic kidney disease with heart failure and with stage 5 chronic kidney disease, or end stage renal disease: Secondary | ICD-10-CM | POA: Diagnosis not present

## 2018-08-27 DIAGNOSIS — E1151 Type 2 diabetes mellitus with diabetic peripheral angiopathy without gangrene: Secondary | ICD-10-CM | POA: Diagnosis not present

## 2018-08-28 DIAGNOSIS — N2581 Secondary hyperparathyroidism of renal origin: Secondary | ICD-10-CM | POA: Diagnosis not present

## 2018-08-28 DIAGNOSIS — E119 Type 2 diabetes mellitus without complications: Secondary | ICD-10-CM | POA: Diagnosis not present

## 2018-08-28 DIAGNOSIS — Z992 Dependence on renal dialysis: Secondary | ICD-10-CM | POA: Diagnosis not present

## 2018-08-28 DIAGNOSIS — N186 End stage renal disease: Secondary | ICD-10-CM | POA: Diagnosis not present

## 2018-08-30 ENCOUNTER — Other Ambulatory Visit: Payer: Self-pay

## 2018-08-30 ENCOUNTER — Ambulatory Visit (HOSPITAL_COMMUNITY)
Admission: RE | Admit: 2018-08-30 | Discharge: 2018-08-30 | Disposition: A | Discharge status: Home / Self Care | Payer: Medicare Other | Source: Ambulatory Visit | Attending: Internal Medicine | Admitting: Internal Medicine

## 2018-08-30 DIAGNOSIS — M869 Osteomyelitis, unspecified: Secondary | ICD-10-CM | POA: Diagnosis not present

## 2018-08-30 DIAGNOSIS — E11621 Type 2 diabetes mellitus with foot ulcer: Secondary | ICD-10-CM | POA: Diagnosis not present

## 2018-08-30 DIAGNOSIS — L97509 Non-pressure chronic ulcer of other part of unspecified foot with unspecified severity: Secondary | ICD-10-CM | POA: Diagnosis not present

## 2018-08-30 DIAGNOSIS — N2581 Secondary hyperparathyroidism of renal origin: Secondary | ICD-10-CM | POA: Diagnosis not present

## 2018-08-30 DIAGNOSIS — L97529 Non-pressure chronic ulcer of other part of left foot with unspecified severity: Secondary | ICD-10-CM | POA: Diagnosis not present

## 2018-08-30 DIAGNOSIS — E119 Type 2 diabetes mellitus without complications: Secondary | ICD-10-CM | POA: Diagnosis not present

## 2018-08-30 DIAGNOSIS — N186 End stage renal disease: Secondary | ICD-10-CM | POA: Diagnosis not present

## 2018-08-30 DIAGNOSIS — E11622 Type 2 diabetes mellitus with other skin ulcer: Secondary | ICD-10-CM | POA: Diagnosis not present

## 2018-08-30 DIAGNOSIS — Z992 Dependence on renal dialysis: Secondary | ICD-10-CM | POA: Diagnosis not present

## 2018-08-30 DIAGNOSIS — E1169 Type 2 diabetes mellitus with other specified complication: Secondary | ICD-10-CM | POA: Insufficient documentation

## 2018-08-31 DIAGNOSIS — I132 Hypertensive heart and chronic kidney disease with heart failure and with stage 5 chronic kidney disease, or end stage renal disease: Secondary | ICD-10-CM | POA: Diagnosis not present

## 2018-08-31 DIAGNOSIS — E1151 Type 2 diabetes mellitus with diabetic peripheral angiopathy without gangrene: Secondary | ICD-10-CM | POA: Diagnosis not present

## 2018-08-31 DIAGNOSIS — I69354 Hemiplegia and hemiparesis following cerebral infarction affecting left non-dominant side: Secondary | ICD-10-CM | POA: Diagnosis not present

## 2018-08-31 DIAGNOSIS — I509 Heart failure, unspecified: Secondary | ICD-10-CM | POA: Diagnosis not present

## 2018-08-31 DIAGNOSIS — E1122 Type 2 diabetes mellitus with diabetic chronic kidney disease: Secondary | ICD-10-CM | POA: Diagnosis not present

## 2018-08-31 DIAGNOSIS — Z9981 Dependence on supplemental oxygen: Secondary | ICD-10-CM | POA: Diagnosis not present

## 2018-08-31 DIAGNOSIS — N186 End stage renal disease: Secondary | ICD-10-CM | POA: Diagnosis not present

## 2018-08-31 DIAGNOSIS — J449 Chronic obstructive pulmonary disease, unspecified: Secondary | ICD-10-CM | POA: Diagnosis not present

## 2018-08-31 DIAGNOSIS — Z992 Dependence on renal dialysis: Secondary | ICD-10-CM | POA: Diagnosis not present

## 2018-08-31 DIAGNOSIS — Z87891 Personal history of nicotine dependence: Secondary | ICD-10-CM | POA: Diagnosis not present

## 2018-08-31 DIAGNOSIS — Z4800 Encounter for change or removal of nonsurgical wound dressing: Secondary | ICD-10-CM | POA: Diagnosis not present

## 2018-08-31 DIAGNOSIS — E11621 Type 2 diabetes mellitus with foot ulcer: Secondary | ICD-10-CM | POA: Diagnosis not present

## 2018-08-31 DIAGNOSIS — L97422 Non-pressure chronic ulcer of left heel and midfoot with fat layer exposed: Secondary | ICD-10-CM | POA: Diagnosis not present

## 2018-08-31 DIAGNOSIS — I420 Dilated cardiomyopathy: Secondary | ICD-10-CM | POA: Diagnosis not present

## 2018-08-31 NOTE — Progress Notes (Signed)
Subjective:    Patient ID: Nathaniel White, male    DOB: 1960-10-28, 58 y.o.   MRN: [id number]  This is a 58 year old male with end-stage renal disease hypertension cardiomyopathy congestive heart failure asthma previous stroke anxiety and anemia.  The patient is referred by his primary care physician for pulmonary evaluation of COPD.  The patient does use oxygen when on dialysis.  Patient does have minimal cough.  The patient chronically has been on Advair 250 at 1 inhalation twice daily for COPD.  The patient unfortunately has not been using the Advair every day.  The patient is productive of clear mucus but no purulent mucus.  Patient also has an albuterol inhaler as needed.  Patient has a history of previous heart failure ejection fraction in the 40% range but has not seen cardiology more recently. Patient also has peripheral artery disease with left lower extremity chronic ischemia followed by vascular surgery.  Note the patient has been on hemodialysis for 20 years.  He is no longer smoking.   Note there are pulmonary functions from 2015 which show a restrictive defect and a reduction in diffusion capacity  Shortness of Breath This is a chronic problem. The current episode started more than 1 year ago. Episode frequency: dyspnea with exertion. The problem has been unchanged. Associated symptoms include sputum production. Pertinent negatives include no chest pain, headaches, hemoptysis, orthopnea, PND or wheezing. Associated symptoms comments: Chronic cough clear. Risk factors include smoking. He has tried beta agonist inhalers for the symptoms. The treatment provided moderate relief. His past medical history is significant for asthma, COPD and a heart failure. There is no history of CAD or pneumonia.      Past Medical History:  Diagnosis Date  . Anemia   . Anxiety   . Asthma   . CHF (congestive heart failure) ([place])   . Complication from renal dialysis device 11/02/2013  .  Complication of anesthesia    according to pt and spouse pt was moving around and cough while under and pt had difficulty waking up so the anesthesia had to be reversed. and pt admitted to ICU.   Marland Kitchen COPD (chronic obstructive pulmonary disease) ([place])   . ESOPHAGEAL VARICES 10/04/2008   Qualifier: Diagnosis of  By: Fuller Plan MD Marijo Conception ESRD    on HD, M- W- F   . Hemiparesis due to old stroke ([place])    left  . Hypertension   . LV dysfunction    EF 25-30% by echo 07/2011  . Memory loss due to medical condition    due to stroke  . MR (mitral regurgitation)    moderate to severe, echo 07/2011  . Shortness of breath   . Stroke [facility])    TIA's-left sided weakness  . Tobacco abuse      Family History  Problem Relation Age of Onset  . Hypertension Mother   . Varicose Veins Mother   . Diabetes Paternal Grandmother   . Cancer Paternal Grandfather   . CAD Paternal Uncle      Social History   Socioeconomic History  . Marital status: Single    Spouse name: Not on file  . Number of children: 1  . Years of education: Not on file  . Highest education level: Not on file  Occupational History  . Occupation: Geographical information systems officer: DISABLED  Social Needs  . Financial resource strain: Not on file  . Food insecurity  Worry: Not on file    Inability: Not on file  . Transportation needs    Medical: Not on file    Non-medical: Not on file  Tobacco Use  . Smoking status: Former Smoker    Packs/day: 0.50    Years: 32.00    Pack years: 16.00    Types: Cigarettes    Quit date: 10/13/2012    Years since quitting: 5.8  . Smokeless tobacco: Never Used  Substance and Sexual Activity  . Alcohol use: No    Alcohol/week: 0.0 standard drinks  . Drug use: No  . Sexual activity: Not Currently  Lifestyle  . Physical activity    Days per week: Not on file    Minutes per session: Not on file  . Stress: Not on file  Relationships  . Social Herbalist on phone:  Not on file    Gets together: Not on file    Attends religious service: Not on file    Active member of club or organization: Not on file    Attends meetings of clubs or organizations: Not on file    Relationship status: Not on file  . Intimate partner violence    Fear of current or ex partner: Not on file    Emotionally abused: Not on file    Physically abused: Not on file    Forced sexual activity: Not on file  Other Topics Concern  . Not on file  Social History Narrative  . Not on file     Allergies  Allergen Reactions  . Codeine Shortness Of Breath     Outpatient Medications Prior to Visit  Medication Sig Dispense Refill  . acetaminophen (TYLENOL) 500 MG tablet Take 1 tablet (500 mg total) by mouth every 6 (six) hours as needed. 30 tablet 0  . aspirin EC 81 MG tablet Take 81 mg by mouth 3 (three) times a week.    . clopidogrel (PLAVIX) 75 MG tablet Take 1 tablet (75 mg total) by mouth daily. 30 tablet 11  . FOSRENOL 1000 MG chewable tablet Chew 1,000 mg by mouth 3 (three) times daily with meals.   12  . silver sulfADIAZINE (SILVADENE) 1 % cream Apply topically daily to wound 50 g 0  . tadalafil (CIALIS) 5 MG tablet Take 5 mg by mouth daily as needed for erectile dysfunction.    . traMADol (ULTRAM) 50 MG tablet Take 50 mg by mouth 2 (two) times daily. Take one tablet by mouth 2x a day as needed.    . VELPHORO 500 MG chewable tablet CRUSH OR CHEW AND SWALLOW 2 TABLETS 3 TIMES A DAY WITH MEALS    . albuterol (VENTOLIN HFA) 108 (90 Base) MCG/ACT inhaler Inhale 1-2 puffs into the lungs every 6 (six) hours as needed for wheezing or shortness of breath. 18 g 0  . Fluticasone-Salmeterol (ADVAIR) 250-50 MCG/DOSE AEPB Inhale 1 puff into the lungs 2 (two) times a day. 60 each 2  . glucose blood (ONE TOUCH ULTRA TEST) test strip     . hydroxypropyl methylcellulose / hypromellose (ISOPTO TEARS / GONIOVISC) 2.5 % ophthalmic solution Place 1 drop into both eyes as needed for dry eyes.    Marland Kitchen  loperamide (IMODIUM) 2 MG capsule Take 4 mg by mouth as needed for diarrhea or loose stools.    . montelukast (SINGULAIR) 10 MG tablet Take 1 tablet (10 mg total) by mouth at bedtime. (Patient not taking: Reported on 09/01/2018) 90 tablet 3  .  rosuvastatin (CRESTOR) 10 MG tablet Take 1 tablet (10 mg total) by mouth at bedtime. (Patient not taking: Reported on 09/01/2018) 90 tablet 3  . SENSIPAR 30 MG tablet Take 30 mg by mouth 2 (two) times daily with a meal.      No facility-administered medications prior to visit.      Review of Systems  Constitutional: Negative.   HENT: Negative.   Eyes: Negative.   Respiratory: Positive for cough, sputum production, chest tightness and shortness of breath. Negative for hemoptysis, choking and wheezing.   Cardiovascular: Negative for chest pain, palpitations, orthopnea and PND.  Gastrointestinal: Negative.   Endocrine: Negative.   Genitourinary: Negative.   Allergic/Immunologic: Negative.   Neurological: Negative for dizziness, tremors, seizures, syncope, weakness and headaches.  Psychiatric/Behavioral: Negative.        Objective:   Physical Exam Vitals:   09/01/18 1049  BP: 125/72  Pulse: (!) 106  Temp: 98.4 F (36.9 C)  TempSrc: Oral  SpO2: 95%  Height: _0  (1.753 m)    Gen: Pleasant, well-nourished, in no distress,  normal affect  ENT: No lesions,  mouth clear,  oropharynx clear, no postnasal drip  Neck: No JVD, no TMG, no carotid bruits  Lungs: No use of accessory muscles, no dullness to percussion, clear without rales or rhonchi  Cardiovascular: RRR, heart sounds normal, no murmur or gallops, no peripheral edema,, there is a dressing over the left lower extremity  Abdomen: soft and NT, no HSM,  BS normal  Musculoskeletal: No deformities, no cyanosis or clubbing  Neuro: alert, non focal  Skin: Warm, no lesions or rashes  No recent echocardiogram is seen  BMP Latest Ref Rng & Units 07/21/2018 03/03/2018 05/26/2017  Glucose  70 - 99 mg/dL 108(H) 94 81  BUN 6 - 20 mg/dL 50(H) 63(H) 36(H)  Creatinine 0.61 - 1.24 mg/dL 9.30(H) 11.68(H) 8.15(H)  Sodium 135 - 145 mmol/L 137 133(L) 139  Potassium 3.5 - 5.1 mmol/L 4.2 5.9(H) 4.6  Chloride 98 - 111 mmol/L 98 93(L) 99(L)  CO2 22 - 32 mmol/L - 26 26  Calcium 8.9 - 10.3 mg/dL - 9.1 9.6   CBC Latest Ref Rng & Units 07/21/2018 03/03/2018 05/26/2017  WBC 4.0 - 10.5 K/uL - 5.0 4.2  Hemoglobin 13.0 - 17.0 g/dL 13.9 13.6 12.1(L)  Hematocrit 39.0 - 52.0 % 41.0 43.4 36.6(L)  Platelets 150 - 400 K/uL - 73(L) 52(L)   Hepatic Function Latest Ref Rng & Units 01/17/2015 10/06/2013 08/12/2012  Total Protein 6.4 - 8.3 g/dL 7.8 7.3 6.6  Albumin 3.5 - 5.0 g/dL 3.5 3.3(L) 3.3(L)  AST 5 - 34 U/L 17 39(H) 20  ALT 0 - 55 U/L _1 Alk Phosphatase 40 - 150 U/L 120 135(H) 68  Total Bilirubin 0.20 - 1.20 mg/dL 0.84 0.5 0.5    2015 pulmonary function studies were reviewed and show restrictive defect on spirometry and lung volumes and a reduced diffusion capacity  Chest x-ray February 2020 IMPRESSION: Cardiomegaly with vascular congestion.  Chronic scarring at the right lung base.  Interstitial prominence could reflect interstitial edema or chronic interstitial lung disease.      Assessment & Plan:  I personally reviewed all images and lab data in the [facility] system as well as any outside material available during this office visit and agree with the  radiology impressions.   Chronic obstructive pulmonary disease (HCC) Chronic obstructive lung disease now on Advair 250 and have instructed the patient as to the proper use of  a dry powdered inhaler to use 1 inhalation twice daily  We will continue the Ventolin inhaler as prescribed and I reinforced the proper HFA technique  ESRD (end stage renal disease) on dialysis Chronic hemodialysis patient  Cough Chronic cough previously has been described to chronic aspiration however I do not find evidence for that today  Pulmonary functions  show restrictive defect from 2015  There is chronic scarring in the right lower lobe probably from previous pneumonia  Chronic cough may be more the basis of lower airway inflammation and mild edema from chronic systolic heart failure  Tobacco abuse History of tobacco use currently now  in remission   Zakry was seen today for copd.  Diagnoses and all orders for this visit:  Chronic obstructive pulmonary disease, unspecified COPD type ([place]) -     albuterol (VENTOLIN HFA) 108 (90 Base) MCG/ACT inhaler; Inhale 1-2 puffs into the lungs every 6 (six) hours as needed for wheezing or shortness of breath. -     Fluticasone-Salmeterol (ADVAIR) 250-50 MCG/DOSE AEPB; Inhale 1 puff into the lungs 2 (two) times daily.  Cough  ESRD (end stage renal disease) on dialysis ([place])  Tobacco abuse

## 2018-09-01 ENCOUNTER — Encounter: Payer: Self-pay | Admitting: Critical Care Medicine

## 2018-09-01 ENCOUNTER — Encounter: Payer: Self-pay | Admitting: Internal Medicine

## 2018-09-01 ENCOUNTER — Other Ambulatory Visit: Payer: Self-pay

## 2018-09-01 ENCOUNTER — Ambulatory Visit (INDEPENDENT_AMBULATORY_CARE_PROVIDER_SITE_OTHER): Payer: Medicare Other | Admitting: Internal Medicine

## 2018-09-01 ENCOUNTER — Ambulatory Visit: Payer: Medicare Other | Attending: Critical Care Medicine | Admitting: Critical Care Medicine

## 2018-09-01 VITALS — BP 125/72 | HR 106 | Temp 98.4°F | Ht 69.0 in

## 2018-09-01 DIAGNOSIS — N186 End stage renal disease: Secondary | ICD-10-CM | POA: Insufficient documentation

## 2018-09-01 DIAGNOSIS — Z87891 Personal history of nicotine dependence: Secondary | ICD-10-CM | POA: Diagnosis not present

## 2018-09-01 DIAGNOSIS — R413 Other amnesia: Secondary | ICD-10-CM | POA: Diagnosis not present

## 2018-09-01 DIAGNOSIS — Z7982 Long term (current) use of aspirin: Secondary | ICD-10-CM | POA: Insufficient documentation

## 2018-09-01 DIAGNOSIS — E1151 Type 2 diabetes mellitus with diabetic peripheral angiopathy without gangrene: Secondary | ICD-10-CM | POA: Diagnosis not present

## 2018-09-01 DIAGNOSIS — L89509 Pressure ulcer of unspecified ankle, unspecified stage: Secondary | ICD-10-CM | POA: Insufficient documentation

## 2018-09-01 DIAGNOSIS — D649 Anemia, unspecified: Secondary | ICD-10-CM | POA: Insufficient documentation

## 2018-09-01 DIAGNOSIS — J449 Chronic obstructive pulmonary disease, unspecified: Secondary | ICD-10-CM | POA: Diagnosis not present

## 2018-09-01 DIAGNOSIS — Z79899 Other long term (current) drug therapy: Secondary | ICD-10-CM | POA: Insufficient documentation

## 2018-09-01 DIAGNOSIS — I509 Heart failure, unspecified: Secondary | ICD-10-CM | POA: Diagnosis not present

## 2018-09-01 DIAGNOSIS — I340 Nonrheumatic mitral (valve) insufficiency: Secondary | ICD-10-CM | POA: Insufficient documentation

## 2018-09-01 DIAGNOSIS — Z4800 Encounter for change or removal of nonsurgical wound dressing: Secondary | ICD-10-CM | POA: Diagnosis not present

## 2018-09-01 DIAGNOSIS — Z992 Dependence on renal dialysis: Secondary | ICD-10-CM | POA: Insufficient documentation

## 2018-09-01 DIAGNOSIS — I132 Hypertensive heart and chronic kidney disease with heart failure and with stage 5 chronic kidney disease, or end stage renal disease: Secondary | ICD-10-CM | POA: Insufficient documentation

## 2018-09-01 DIAGNOSIS — R05 Cough: Secondary | ICD-10-CM

## 2018-09-01 DIAGNOSIS — E11621 Type 2 diabetes mellitus with foot ulcer: Secondary | ICD-10-CM | POA: Diagnosis not present

## 2018-09-01 DIAGNOSIS — R059 Cough, unspecified: Secondary | ICD-10-CM

## 2018-09-01 DIAGNOSIS — L89522 Pressure ulcer of left ankle, stage 2: Secondary | ICD-10-CM

## 2018-09-01 DIAGNOSIS — Z8673 Personal history of transient ischemic attack (TIA), and cerebral infarction without residual deficits: Secondary | ICD-10-CM | POA: Insufficient documentation

## 2018-09-01 DIAGNOSIS — R531 Weakness: Secondary | ICD-10-CM | POA: Diagnosis not present

## 2018-09-01 DIAGNOSIS — L97422 Non-pressure chronic ulcer of left heel and midfoot with fat layer exposed: Secondary | ICD-10-CM | POA: Diagnosis not present

## 2018-09-01 DIAGNOSIS — I420 Dilated cardiomyopathy: Secondary | ICD-10-CM | POA: Diagnosis not present

## 2018-09-01 DIAGNOSIS — Z720 Tobacco use: Secondary | ICD-10-CM

## 2018-09-01 DIAGNOSIS — I739 Peripheral vascular disease, unspecified: Secondary | ICD-10-CM | POA: Diagnosis not present

## 2018-09-01 DIAGNOSIS — Z8249 Family history of ischemic heart disease and other diseases of the circulatory system: Secondary | ICD-10-CM | POA: Insufficient documentation

## 2018-09-01 DIAGNOSIS — E1122 Type 2 diabetes mellitus with diabetic chronic kidney disease: Secondary | ICD-10-CM | POA: Diagnosis not present

## 2018-09-01 MED ORDER — FLUTICASONE-SALMETEROL 250-50 MCG/DOSE IN AEPB: 1.0000 | INHALATION_SPRAY | Freq: Two times a day (BID) | RESPIRATORY_TRACT | 3 refills | Status: DC

## 2018-09-01 MED ORDER — ALBUTEROL SULFATE HFA 108 (90 BASE) MCG/ACT IN AERS: 1.0000 | INHALATION_SPRAY | Freq: Four times a day (QID) | RESPIRATORY_TRACT | 2 refills | Status: DC | PRN

## 2018-09-01 MED FILL — !VENTOLIN HFA INHALER: 108 (90 BAS | 25 days supply | Qty: 18 | Fill #0

## 2018-09-01 MED FILL — !ADVAIR 250/50 DISKUS: 250-50 | 30 days supply | Qty: 60 | Fill #0

## 2018-09-01 NOTE — Assessment & Plan Note (Signed)
Chronic cough previously has been described to chronic aspiration however I do not find evidence for that today  Pulmonary functions show restrictive defect from 2015  There is chronic scarring in the right lower lobe probably from previous pneumonia  Chronic cough may be more the basis of lower airway inflammation and mild edema from chronic systolic heart failure

## 2018-09-01 NOTE — Patient Instructions (Signed)
Stay on Advair 1 inhalation twice daily  Use albuterol 1 to 2 puffs every 6 hours as needed for shortness of breath  No other medication changes for now and refills on the orders were sent to the pharmacy  Return for recheck with Dr. Joya Gaskins in 2 months

## 2018-09-01 NOTE — Assessment & Plan Note (Signed)
History of tobacco use currently now  in remission

## 2018-09-01 NOTE — Assessment & Plan Note (Signed)
Chronic obstructive lung disease now on Advair 250 and have instructed the patient as to the proper use of a dry powdered inhaler to use 1 inhalation twice daily  We will continue the Ventolin inhaler as prescribed and I reinforced the proper HFA technique

## 2018-09-01 NOTE — Assessment & Plan Note (Signed)
Chronic hemodialysis patient

## 2018-09-02 DIAGNOSIS — N186 End stage renal disease: Secondary | ICD-10-CM | POA: Diagnosis not present

## 2018-09-02 DIAGNOSIS — L97425 Non-pressure chronic ulcer of left heel and midfoot with muscle involvement without evidence of necrosis: Secondary | ICD-10-CM | POA: Diagnosis not present

## 2018-09-02 DIAGNOSIS — L97422 Non-pressure chronic ulcer of left heel and midfoot with fat layer exposed: Secondary | ICD-10-CM | POA: Diagnosis not present

## 2018-09-02 DIAGNOSIS — E1152 Type 2 diabetes mellitus with diabetic peripheral angiopathy with gangrene: Secondary | ICD-10-CM | POA: Diagnosis not present

## 2018-09-02 DIAGNOSIS — S81802A Unspecified open wound, left lower leg, initial encounter: Secondary | ICD-10-CM | POA: Diagnosis not present

## 2018-09-02 DIAGNOSIS — E119 Type 2 diabetes mellitus without complications: Secondary | ICD-10-CM | POA: Diagnosis not present

## 2018-09-02 DIAGNOSIS — I96 Gangrene, not elsewhere classified: Secondary | ICD-10-CM | POA: Diagnosis not present

## 2018-09-02 DIAGNOSIS — E11621 Type 2 diabetes mellitus with foot ulcer: Secondary | ICD-10-CM | POA: Diagnosis not present

## 2018-09-02 DIAGNOSIS — N2581 Secondary hyperparathyroidism of renal origin: Secondary | ICD-10-CM | POA: Diagnosis not present

## 2018-09-02 DIAGNOSIS — L97222 Non-pressure chronic ulcer of left calf with fat layer exposed: Secondary | ICD-10-CM | POA: Diagnosis not present

## 2018-09-02 DIAGNOSIS — Z992 Dependence on renal dialysis: Secondary | ICD-10-CM | POA: Diagnosis not present

## 2018-09-02 DIAGNOSIS — L97522 Non-pressure chronic ulcer of other part of left foot with fat layer exposed: Secondary | ICD-10-CM | POA: Diagnosis not present

## 2018-09-02 NOTE — Progress Notes (Signed)
[facility]      Reason for Consult: ? Wound infection    Referring Physician: Dr. Dellia Nims    Patient ID: Nathaniel White, male    DOB: 02-23-1960, 58 y.o.   MRN: [id number]  HPI:   Here for evaluation of left leg ulcers.  They are located behind the leg, lateral ankle.  Overall he feels it is healing.  He previously had noted a bad smell but not anymore.  He had a wound swab done of one of the ulcers and had growth of E coli and Enterococcus.  He has been on Augmentin for this.  He has had no fever, no chills.  He is also followed by Dr. Donzetta Matters and had a recent aortogram.  Missed his follow up with him.  Aortogram did not find any flow-limiting stenosis.   Previous record reviewed in Epic and partially summarized above.   Past Medical History:  Diagnosis Date  . Anemia   . Anxiety   . Asthma   . CHF (congestive heart failure) ([place])   . Complication from renal dialysis device 11/02/2013  . Complication of anesthesia    according to pt and spouse pt was moving around and cough while under and pt had difficulty waking up so the anesthesia had to be reversed. and pt admitted to ICU.   Marland Kitchen COPD (chronic obstructive pulmonary disease) ([place])   . ESOPHAGEAL VARICES 10/04/2008   Qualifier: Diagnosis of  By: Fuller Plan MD Marijo Conception ESRD    on HD, M- W- F   . Hemiparesis due to old stroke ([place])    left  . Hypertension   . LV dysfunction    EF 25-30% by echo 07/2011  . Memory loss due to medical condition    due to stroke  . MR (mitral regurgitation)    moderate to severe, echo 07/2011  . Shortness of breath   . Stroke [facility])    TIA's-left sided weakness  . Tobacco abuse     Prior to Admission medications   Medication Sig Start Date End Date Taking? Authorizing Provider  acetaminophen (TYLENOL) 500 MG tablet Take 1 tablet (500 mg total) by mouth every 6 (six) hours as needed. 04/11/18  Yes Zigmund Gottron, NP  albuterol (VENTOLIN HFA) 108 (90 Base) MCG/ACT  inhaler Inhale 1-2 puffs into the lungs every 6 (six) hours as needed for wheezing or shortness of breath. 09/01/18  Yes Elsie Stain, MD  aspirin EC 81 MG tablet Take 81 mg by mouth 3 (three) times a week.   Yes [provider]  clopidogrel (PLAVIX) 75 MG tablet Take 1 tablet (75 mg total) by mouth daily. 07/21/18 07/21/19 Yes Waynetta Sandy, MD  Fluticasone-Salmeterol (ADVAIR) 250-50 MCG/DOSE AEPB Inhale 1 puff into the lungs 2 (two) times daily. 09/01/18  Yes Elsie Stain, MD  FOSRENOL 1000 MG chewable tablet Chew 1,000 mg by mouth 3 (three) times daily with meals.  12/25/14  Yes [provider]  glucose blood (ONE TOUCH ULTRA TEST) test strip  12/26/09  Yes [provider]  hydroxypropyl methylcellulose / hypromellose (ISOPTO TEARS / GONIOVISC) 2.5 % ophthalmic solution Place 1 drop into both eyes as needed for dry eyes.   Yes [provider]  loperamide (IMODIUM) 2 MG capsule Take 4 mg by mouth as needed for diarrhea or loose stools.   Yes [provider]  montelukast (SINGULAIR) 10 MG tablet Take 1 tablet (10  mg total) by mouth at bedtime. 08/08/18 11/06/18 Yes Gildardo Pounds, NP  rosuvastatin (CRESTOR) 10 MG tablet Take 1 tablet (10 mg total) by mouth at bedtime. 08/08/18 11/06/18 Yes Gildardo Pounds, NP  SENSIPAR 30 MG tablet Take 30 mg by mouth 2 (two) times daily with a meal.  01/11/15  Yes [provider]  silver sulfADIAZINE (SILVADENE) 1 % cream Apply topically daily to wound 06/19/18  Yes Price, Christian Mate, DPM  tadalafil (CIALIS) 5 MG tablet Take 5 mg by mouth daily as needed for erectile dysfunction.   Yes [provider]  traMADol (ULTRAM) 50 MG tablet Take 50 mg by mouth 2 (two) times daily. Take one tablet by mouth 2x a day as needed.   Yes [provider]  VELPHORO 500 MG chewable tablet CRUSH OR CHEW AND SWALLOW 2 TABLETS 3 TIMES A DAY WITH MEALS 07/24/18  Yes [provider]     Allergies  Allergen Reactions  . Codeine Shortness Of Breath    Social History   Tobacco Use  . Smoking status: Former Smoker    Packs/day: 0.50    Years: 32.00    Pack years: 16.00    Types: Cigarettes    Quit date: 10/13/2012    Years since quitting: 5.8  . Smokeless tobacco: Never Used  Substance Use Topics  . Alcohol use: No    Alcohol/week: 0.0 standard drinks  . Drug use: No    Family History  Problem Relation Age of Onset  . Hypertension Mother   . Varicose Veins Mother   . Diabetes Paternal Grandmother   . Cancer Paternal Grandfather   . CAD Paternal Uncle     Review of Systems  Constitutional: negative for fevers and chills Gastrointestinal: negative for nausea and diarrhea All other systems reviewed and are negative    Constitutional: in no apparent distress, ina wheelchair  Vitals:   09/01/18 1606  BP: 122/72  Pulse: (!) 106   EYES: anicteric Cardiovascular: Cor RRR Respiratory: no respiratory distress Musculoskeletal: no pedal edema noted Skin: left lower extremity with open ulcers, no surrounding erythema, no pus, no necrotic tissue, not down to bone.   Hematologic: no cervical lad Psych: normal affect  Labs: Lab Results  Component Value Date   WBC 5.0 03/03/2018   HGB 13.9 07/21/2018   HCT 41.0 07/21/2018   MCV 99.1 03/03/2018   PLT 73 (L) 03/03/2018    Lab Results  Component Value Date   CREATININE 9.30 (H) 07/21/2018   BUN 50 (H) 07/21/2018   NA 137 07/21/2018   K 4.2 07/21/2018   CL 98 07/21/2018   CO2 26 03/03/2018    Lab Results  Component Value Date   ALT 9 01/17/2015   AST 17 01/17/2015   ALKPHOS 120 01/17/2015   BILITOT 0.84 01/17/2015   INR 1.0 10/08/2013     Assessment: lower extremity ulcers.  Wound swab noted.  On exam, no signs of infection so no indication for treatment with antibiotics.  I have recommended stopping his antibiotics at this time.  If he develops new signs of infection, I can see him at that time.  I also advised him to continue to follow with Dr. Donzetta Matters.  I am concerned with his second toe and appears to have some non-viable tissue.  He states he has follow up with him coming up.    Plan: 1) stop antibiotics 2) follow up as needed

## 2018-09-04 DIAGNOSIS — N186 End stage renal disease: Secondary | ICD-10-CM | POA: Diagnosis not present

## 2018-09-04 DIAGNOSIS — N2581 Secondary hyperparathyroidism of renal origin: Secondary | ICD-10-CM | POA: Diagnosis not present

## 2018-09-04 DIAGNOSIS — Z992 Dependence on renal dialysis: Secondary | ICD-10-CM | POA: Diagnosis not present

## 2018-09-04 DIAGNOSIS — E119 Type 2 diabetes mellitus without complications: Secondary | ICD-10-CM | POA: Diagnosis not present

## 2018-09-05 DIAGNOSIS — E11621 Type 2 diabetes mellitus with foot ulcer: Secondary | ICD-10-CM | POA: Diagnosis not present

## 2018-09-05 DIAGNOSIS — E1151 Type 2 diabetes mellitus with diabetic peripheral angiopathy without gangrene: Secondary | ICD-10-CM | POA: Diagnosis not present

## 2018-09-05 DIAGNOSIS — I132 Hypertensive heart and chronic kidney disease with heart failure and with stage 5 chronic kidney disease, or end stage renal disease: Secondary | ICD-10-CM | POA: Diagnosis not present

## 2018-09-05 DIAGNOSIS — L97422 Non-pressure chronic ulcer of left heel and midfoot with fat layer exposed: Secondary | ICD-10-CM | POA: Diagnosis not present

## 2018-09-05 DIAGNOSIS — E1122 Type 2 diabetes mellitus with diabetic chronic kidney disease: Secondary | ICD-10-CM | POA: Diagnosis not present

## 2018-09-05 DIAGNOSIS — Z4800 Encounter for change or removal of nonsurgical wound dressing: Secondary | ICD-10-CM | POA: Diagnosis not present

## 2018-09-06 DIAGNOSIS — E119 Type 2 diabetes mellitus without complications: Secondary | ICD-10-CM | POA: Diagnosis not present

## 2018-09-06 DIAGNOSIS — N186 End stage renal disease: Secondary | ICD-10-CM | POA: Diagnosis not present

## 2018-09-06 DIAGNOSIS — N2581 Secondary hyperparathyroidism of renal origin: Secondary | ICD-10-CM | POA: Diagnosis not present

## 2018-09-06 DIAGNOSIS — Z992 Dependence on renal dialysis: Secondary | ICD-10-CM | POA: Diagnosis not present

## 2018-09-09 ENCOUNTER — Encounter (HOSPITAL_BASED_OUTPATIENT_CLINIC_OR_DEPARTMENT_OTHER): Payer: Medicare Other | Attending: Internal Medicine

## 2018-09-09 ENCOUNTER — Other Ambulatory Visit: Payer: Self-pay

## 2018-09-09 DIAGNOSIS — L97222 Non-pressure chronic ulcer of left calf with fat layer exposed: Secondary | ICD-10-CM | POA: Diagnosis not present

## 2018-09-09 DIAGNOSIS — T8612 Kidney transplant failure: Secondary | ICD-10-CM | POA: Diagnosis not present

## 2018-09-09 DIAGNOSIS — S91302A Unspecified open wound, left foot, initial encounter: Secondary | ICD-10-CM | POA: Diagnosis not present

## 2018-09-09 DIAGNOSIS — S91105A Unspecified open wound of left lesser toe(s) without damage to nail, initial encounter: Secondary | ICD-10-CM | POA: Diagnosis not present

## 2018-09-09 DIAGNOSIS — I87332 Chronic venous hypertension (idiopathic) with ulcer and inflammation of left lower extremity: Secondary | ICD-10-CM | POA: Diagnosis not present

## 2018-09-09 DIAGNOSIS — E1151 Type 2 diabetes mellitus with diabetic peripheral angiopathy without gangrene: Secondary | ICD-10-CM | POA: Insufficient documentation

## 2018-09-09 DIAGNOSIS — I509 Heart failure, unspecified: Secondary | ICD-10-CM | POA: Diagnosis not present

## 2018-09-09 DIAGNOSIS — E11621 Type 2 diabetes mellitus with foot ulcer: Secondary | ICD-10-CM | POA: Insufficient documentation

## 2018-09-09 DIAGNOSIS — N186 End stage renal disease: Secondary | ICD-10-CM | POA: Insufficient documentation

## 2018-09-09 DIAGNOSIS — E1122 Type 2 diabetes mellitus with diabetic chronic kidney disease: Secondary | ICD-10-CM | POA: Insufficient documentation

## 2018-09-09 DIAGNOSIS — J449 Chronic obstructive pulmonary disease, unspecified: Secondary | ICD-10-CM | POA: Diagnosis not present

## 2018-09-09 DIAGNOSIS — D509 Iron deficiency anemia, unspecified: Secondary | ICD-10-CM | POA: Diagnosis not present

## 2018-09-09 DIAGNOSIS — L97422 Non-pressure chronic ulcer of left heel and midfoot with fat layer exposed: Secondary | ICD-10-CM | POA: Diagnosis not present

## 2018-09-09 DIAGNOSIS — I132 Hypertensive heart and chronic kidney disease with heart failure and with stage 5 chronic kidney disease, or end stage renal disease: Secondary | ICD-10-CM | POA: Diagnosis not present

## 2018-09-09 DIAGNOSIS — Z4800 Encounter for change or removal of nonsurgical wound dressing: Secondary | ICD-10-CM | POA: Diagnosis not present

## 2018-09-09 DIAGNOSIS — L97423 Non-pressure chronic ulcer of left heel and midfoot with necrosis of muscle: Secondary | ICD-10-CM | POA: Insufficient documentation

## 2018-09-09 DIAGNOSIS — Z23 Encounter for immunization: Secondary | ICD-10-CM | POA: Diagnosis not present

## 2018-09-09 DIAGNOSIS — E119 Type 2 diabetes mellitus without complications: Secondary | ICD-10-CM | POA: Diagnosis not present

## 2018-09-09 DIAGNOSIS — Z992 Dependence on renal dialysis: Secondary | ICD-10-CM | POA: Diagnosis not present

## 2018-09-09 DIAGNOSIS — N2581 Secondary hyperparathyroidism of renal origin: Secondary | ICD-10-CM | POA: Diagnosis not present

## 2018-09-09 DIAGNOSIS — S81802A Unspecified open wound, left lower leg, initial encounter: Secondary | ICD-10-CM | POA: Diagnosis not present

## 2018-09-10 DIAGNOSIS — I132 Hypertensive heart and chronic kidney disease with heart failure and with stage 5 chronic kidney disease, or end stage renal disease: Secondary | ICD-10-CM | POA: Diagnosis not present

## 2018-09-10 DIAGNOSIS — L97422 Non-pressure chronic ulcer of left heel and midfoot with fat layer exposed: Secondary | ICD-10-CM | POA: Diagnosis not present

## 2018-09-10 DIAGNOSIS — E11621 Type 2 diabetes mellitus with foot ulcer: Secondary | ICD-10-CM | POA: Diagnosis not present

## 2018-09-10 DIAGNOSIS — E1122 Type 2 diabetes mellitus with diabetic chronic kidney disease: Secondary | ICD-10-CM | POA: Diagnosis not present

## 2018-09-10 DIAGNOSIS — E1151 Type 2 diabetes mellitus with diabetic peripheral angiopathy without gangrene: Secondary | ICD-10-CM | POA: Diagnosis not present

## 2018-09-10 DIAGNOSIS — Z4800 Encounter for change or removal of nonsurgical wound dressing: Secondary | ICD-10-CM | POA: Diagnosis not present

## 2018-09-11 DIAGNOSIS — Z992 Dependence on renal dialysis: Secondary | ICD-10-CM | POA: Diagnosis not present

## 2018-09-11 DIAGNOSIS — Z23 Encounter for immunization: Secondary | ICD-10-CM | POA: Diagnosis not present

## 2018-09-11 DIAGNOSIS — D509 Iron deficiency anemia, unspecified: Secondary | ICD-10-CM | POA: Diagnosis not present

## 2018-09-11 DIAGNOSIS — N2581 Secondary hyperparathyroidism of renal origin: Secondary | ICD-10-CM | POA: Diagnosis not present

## 2018-09-11 DIAGNOSIS — E119 Type 2 diabetes mellitus without complications: Secondary | ICD-10-CM | POA: Diagnosis not present

## 2018-09-11 DIAGNOSIS — N186 End stage renal disease: Secondary | ICD-10-CM | POA: Diagnosis not present

## 2018-09-12 DIAGNOSIS — I132 Hypertensive heart and chronic kidney disease with heart failure and with stage 5 chronic kidney disease, or end stage renal disease: Secondary | ICD-10-CM | POA: Diagnosis not present

## 2018-09-12 DIAGNOSIS — E1122 Type 2 diabetes mellitus with diabetic chronic kidney disease: Secondary | ICD-10-CM | POA: Diagnosis not present

## 2018-09-12 DIAGNOSIS — E11621 Type 2 diabetes mellitus with foot ulcer: Secondary | ICD-10-CM | POA: Diagnosis not present

## 2018-09-12 DIAGNOSIS — E1151 Type 2 diabetes mellitus with diabetic peripheral angiopathy without gangrene: Secondary | ICD-10-CM | POA: Diagnosis not present

## 2018-09-12 DIAGNOSIS — L97422 Non-pressure chronic ulcer of left heel and midfoot with fat layer exposed: Secondary | ICD-10-CM | POA: Diagnosis not present

## 2018-09-12 DIAGNOSIS — Z4800 Encounter for change or removal of nonsurgical wound dressing: Secondary | ICD-10-CM | POA: Diagnosis not present

## 2018-09-13 DIAGNOSIS — N186 End stage renal disease: Secondary | ICD-10-CM | POA: Diagnosis not present

## 2018-09-13 DIAGNOSIS — Z992 Dependence on renal dialysis: Secondary | ICD-10-CM | POA: Diagnosis not present

## 2018-09-13 DIAGNOSIS — Z23 Encounter for immunization: Secondary | ICD-10-CM | POA: Diagnosis not present

## 2018-09-13 DIAGNOSIS — N2581 Secondary hyperparathyroidism of renal origin: Secondary | ICD-10-CM | POA: Diagnosis not present

## 2018-09-13 DIAGNOSIS — D509 Iron deficiency anemia, unspecified: Secondary | ICD-10-CM | POA: Diagnosis not present

## 2018-09-13 DIAGNOSIS — E119 Type 2 diabetes mellitus without complications: Secondary | ICD-10-CM | POA: Diagnosis not present

## 2018-09-16 DIAGNOSIS — N186 End stage renal disease: Secondary | ICD-10-CM | POA: Diagnosis not present

## 2018-09-16 DIAGNOSIS — Z992 Dependence on renal dialysis: Secondary | ICD-10-CM | POA: Diagnosis not present

## 2018-09-16 DIAGNOSIS — E119 Type 2 diabetes mellitus without complications: Secondary | ICD-10-CM | POA: Diagnosis not present

## 2018-09-16 DIAGNOSIS — D509 Iron deficiency anemia, unspecified: Secondary | ICD-10-CM | POA: Diagnosis not present

## 2018-09-16 DIAGNOSIS — N2581 Secondary hyperparathyroidism of renal origin: Secondary | ICD-10-CM | POA: Diagnosis not present

## 2018-09-16 DIAGNOSIS — Z23 Encounter for immunization: Secondary | ICD-10-CM | POA: Diagnosis not present

## 2018-09-17 DIAGNOSIS — E1122 Type 2 diabetes mellitus with diabetic chronic kidney disease: Secondary | ICD-10-CM | POA: Diagnosis not present

## 2018-09-17 DIAGNOSIS — L97422 Non-pressure chronic ulcer of left heel and midfoot with fat layer exposed: Secondary | ICD-10-CM | POA: Diagnosis not present

## 2018-09-17 DIAGNOSIS — I132 Hypertensive heart and chronic kidney disease with heart failure and with stage 5 chronic kidney disease, or end stage renal disease: Secondary | ICD-10-CM | POA: Diagnosis not present

## 2018-09-17 DIAGNOSIS — E1151 Type 2 diabetes mellitus with diabetic peripheral angiopathy without gangrene: Secondary | ICD-10-CM | POA: Diagnosis not present

## 2018-09-17 DIAGNOSIS — Z4800 Encounter for change or removal of nonsurgical wound dressing: Secondary | ICD-10-CM | POA: Diagnosis not present

## 2018-09-17 DIAGNOSIS — E11621 Type 2 diabetes mellitus with foot ulcer: Secondary | ICD-10-CM | POA: Diagnosis not present

## 2018-09-18 DIAGNOSIS — Z992 Dependence on renal dialysis: Secondary | ICD-10-CM | POA: Diagnosis not present

## 2018-09-18 DIAGNOSIS — N186 End stage renal disease: Secondary | ICD-10-CM | POA: Diagnosis not present

## 2018-09-18 DIAGNOSIS — D509 Iron deficiency anemia, unspecified: Secondary | ICD-10-CM | POA: Diagnosis not present

## 2018-09-18 DIAGNOSIS — N2581 Secondary hyperparathyroidism of renal origin: Secondary | ICD-10-CM | POA: Diagnosis not present

## 2018-09-18 DIAGNOSIS — E119 Type 2 diabetes mellitus without complications: Secondary | ICD-10-CM | POA: Diagnosis not present

## 2018-09-18 DIAGNOSIS — Z23 Encounter for immunization: Secondary | ICD-10-CM | POA: Diagnosis not present

## 2018-09-19 DIAGNOSIS — Z4800 Encounter for change or removal of nonsurgical wound dressing: Secondary | ICD-10-CM | POA: Diagnosis not present

## 2018-09-19 DIAGNOSIS — E11621 Type 2 diabetes mellitus with foot ulcer: Secondary | ICD-10-CM | POA: Diagnosis not present

## 2018-09-19 DIAGNOSIS — I132 Hypertensive heart and chronic kidney disease with heart failure and with stage 5 chronic kidney disease, or end stage renal disease: Secondary | ICD-10-CM | POA: Diagnosis not present

## 2018-09-19 DIAGNOSIS — L97422 Non-pressure chronic ulcer of left heel and midfoot with fat layer exposed: Secondary | ICD-10-CM | POA: Diagnosis not present

## 2018-09-19 DIAGNOSIS — E1151 Type 2 diabetes mellitus with diabetic peripheral angiopathy without gangrene: Secondary | ICD-10-CM | POA: Diagnosis not present

## 2018-09-19 DIAGNOSIS — E1122 Type 2 diabetes mellitus with diabetic chronic kidney disease: Secondary | ICD-10-CM | POA: Diagnosis not present

## 2018-09-20 DIAGNOSIS — D509 Iron deficiency anemia, unspecified: Secondary | ICD-10-CM | POA: Diagnosis not present

## 2018-09-20 DIAGNOSIS — E119 Type 2 diabetes mellitus without complications: Secondary | ICD-10-CM | POA: Diagnosis not present

## 2018-09-20 DIAGNOSIS — N2581 Secondary hyperparathyroidism of renal origin: Secondary | ICD-10-CM | POA: Diagnosis not present

## 2018-09-20 DIAGNOSIS — Z23 Encounter for immunization: Secondary | ICD-10-CM | POA: Diagnosis not present

## 2018-09-20 DIAGNOSIS — Z992 Dependence on renal dialysis: Secondary | ICD-10-CM | POA: Diagnosis not present

## 2018-09-20 DIAGNOSIS — N186 End stage renal disease: Secondary | ICD-10-CM | POA: Diagnosis not present

## 2018-09-23 DIAGNOSIS — Z992 Dependence on renal dialysis: Secondary | ICD-10-CM | POA: Diagnosis not present

## 2018-09-23 DIAGNOSIS — Z23 Encounter for immunization: Secondary | ICD-10-CM | POA: Diagnosis not present

## 2018-09-23 DIAGNOSIS — E119 Type 2 diabetes mellitus without complications: Secondary | ICD-10-CM | POA: Diagnosis not present

## 2018-09-23 DIAGNOSIS — N2581 Secondary hyperparathyroidism of renal origin: Secondary | ICD-10-CM | POA: Diagnosis not present

## 2018-09-23 DIAGNOSIS — D509 Iron deficiency anemia, unspecified: Secondary | ICD-10-CM | POA: Diagnosis not present

## 2018-09-23 DIAGNOSIS — N186 End stage renal disease: Secondary | ICD-10-CM | POA: Diagnosis not present

## 2018-09-24 DIAGNOSIS — L97422 Non-pressure chronic ulcer of left heel and midfoot with fat layer exposed: Secondary | ICD-10-CM | POA: Diagnosis not present

## 2018-09-24 DIAGNOSIS — Z4800 Encounter for change or removal of nonsurgical wound dressing: Secondary | ICD-10-CM | POA: Diagnosis not present

## 2018-09-24 DIAGNOSIS — I132 Hypertensive heart and chronic kidney disease with heart failure and with stage 5 chronic kidney disease, or end stage renal disease: Secondary | ICD-10-CM | POA: Diagnosis not present

## 2018-09-24 DIAGNOSIS — E1151 Type 2 diabetes mellitus with diabetic peripheral angiopathy without gangrene: Secondary | ICD-10-CM | POA: Diagnosis not present

## 2018-09-24 DIAGNOSIS — E1122 Type 2 diabetes mellitus with diabetic chronic kidney disease: Secondary | ICD-10-CM | POA: Diagnosis not present

## 2018-09-24 DIAGNOSIS — E11621 Type 2 diabetes mellitus with foot ulcer: Secondary | ICD-10-CM | POA: Diagnosis not present

## 2018-09-25 DIAGNOSIS — N2581 Secondary hyperparathyroidism of renal origin: Secondary | ICD-10-CM | POA: Diagnosis not present

## 2018-09-25 DIAGNOSIS — N186 End stage renal disease: Secondary | ICD-10-CM | POA: Diagnosis not present

## 2018-09-25 DIAGNOSIS — Z23 Encounter for immunization: Secondary | ICD-10-CM | POA: Diagnosis not present

## 2018-09-25 DIAGNOSIS — E119 Type 2 diabetes mellitus without complications: Secondary | ICD-10-CM | POA: Diagnosis not present

## 2018-09-25 DIAGNOSIS — Z992 Dependence on renal dialysis: Secondary | ICD-10-CM | POA: Diagnosis not present

## 2018-09-25 DIAGNOSIS — D509 Iron deficiency anemia, unspecified: Secondary | ICD-10-CM | POA: Diagnosis not present

## 2018-09-26 DIAGNOSIS — I132 Hypertensive heart and chronic kidney disease with heart failure and with stage 5 chronic kidney disease, or end stage renal disease: Secondary | ICD-10-CM | POA: Diagnosis not present

## 2018-09-26 DIAGNOSIS — E11621 Type 2 diabetes mellitus with foot ulcer: Secondary | ICD-10-CM | POA: Diagnosis not present

## 2018-09-26 DIAGNOSIS — E1151 Type 2 diabetes mellitus with diabetic peripheral angiopathy without gangrene: Secondary | ICD-10-CM | POA: Diagnosis not present

## 2018-09-26 DIAGNOSIS — E1122 Type 2 diabetes mellitus with diabetic chronic kidney disease: Secondary | ICD-10-CM | POA: Diagnosis not present

## 2018-09-26 DIAGNOSIS — Z4800 Encounter for change or removal of nonsurgical wound dressing: Secondary | ICD-10-CM | POA: Diagnosis not present

## 2018-09-26 DIAGNOSIS — L97422 Non-pressure chronic ulcer of left heel and midfoot with fat layer exposed: Secondary | ICD-10-CM | POA: Diagnosis not present

## 2018-09-27 DIAGNOSIS — Z23 Encounter for immunization: Secondary | ICD-10-CM | POA: Diagnosis not present

## 2018-09-27 DIAGNOSIS — Z992 Dependence on renal dialysis: Secondary | ICD-10-CM | POA: Diagnosis not present

## 2018-09-27 DIAGNOSIS — D509 Iron deficiency anemia, unspecified: Secondary | ICD-10-CM | POA: Diagnosis not present

## 2018-09-27 DIAGNOSIS — N186 End stage renal disease: Secondary | ICD-10-CM | POA: Diagnosis not present

## 2018-09-27 DIAGNOSIS — N2581 Secondary hyperparathyroidism of renal origin: Secondary | ICD-10-CM | POA: Diagnosis not present

## 2018-09-27 DIAGNOSIS — E119 Type 2 diabetes mellitus without complications: Secondary | ICD-10-CM | POA: Diagnosis not present

## 2018-09-30 DIAGNOSIS — E1151 Type 2 diabetes mellitus with diabetic peripheral angiopathy without gangrene: Secondary | ICD-10-CM | POA: Diagnosis not present

## 2018-09-30 DIAGNOSIS — Z9981 Dependence on supplemental oxygen: Secondary | ICD-10-CM | POA: Diagnosis not present

## 2018-09-30 DIAGNOSIS — I420 Dilated cardiomyopathy: Secondary | ICD-10-CM | POA: Diagnosis not present

## 2018-09-30 DIAGNOSIS — E11621 Type 2 diabetes mellitus with foot ulcer: Secondary | ICD-10-CM | POA: Diagnosis not present

## 2018-09-30 DIAGNOSIS — I509 Heart failure, unspecified: Secondary | ICD-10-CM | POA: Diagnosis not present

## 2018-09-30 DIAGNOSIS — E1122 Type 2 diabetes mellitus with diabetic chronic kidney disease: Secondary | ICD-10-CM | POA: Diagnosis not present

## 2018-09-30 DIAGNOSIS — Z23 Encounter for immunization: Secondary | ICD-10-CM | POA: Diagnosis not present

## 2018-09-30 DIAGNOSIS — Z992 Dependence on renal dialysis: Secondary | ICD-10-CM | POA: Diagnosis not present

## 2018-09-30 DIAGNOSIS — L97422 Non-pressure chronic ulcer of left heel and midfoot with fat layer exposed: Secondary | ICD-10-CM | POA: Diagnosis not present

## 2018-09-30 DIAGNOSIS — N2581 Secondary hyperparathyroidism of renal origin: Secondary | ICD-10-CM | POA: Diagnosis not present

## 2018-09-30 DIAGNOSIS — N186 End stage renal disease: Secondary | ICD-10-CM | POA: Diagnosis not present

## 2018-09-30 DIAGNOSIS — J449 Chronic obstructive pulmonary disease, unspecified: Secondary | ICD-10-CM | POA: Diagnosis not present

## 2018-09-30 DIAGNOSIS — D509 Iron deficiency anemia, unspecified: Secondary | ICD-10-CM | POA: Diagnosis not present

## 2018-09-30 DIAGNOSIS — E119 Type 2 diabetes mellitus without complications: Secondary | ICD-10-CM | POA: Diagnosis not present

## 2018-09-30 DIAGNOSIS — I132 Hypertensive heart and chronic kidney disease with heart failure and with stage 5 chronic kidney disease, or end stage renal disease: Secondary | ICD-10-CM | POA: Diagnosis not present

## 2018-09-30 DIAGNOSIS — Z4800 Encounter for change or removal of nonsurgical wound dressing: Secondary | ICD-10-CM | POA: Diagnosis not present

## 2018-09-30 DIAGNOSIS — I69354 Hemiplegia and hemiparesis following cerebral infarction affecting left non-dominant side: Secondary | ICD-10-CM | POA: Diagnosis not present

## 2018-09-30 DIAGNOSIS — Z87891 Personal history of nicotine dependence: Secondary | ICD-10-CM | POA: Diagnosis not present

## 2018-09-30 MED FILL — !ADVAIR 250/50 DISKUS: 250-50 | 30 days supply | Qty: 60 | Fill #1

## 2018-09-30 MED FILL — !VENTOLIN HFA INHALER: 108 (90 BAS | 25 days supply | Qty: 18 | Fill #1

## 2018-10-01 DIAGNOSIS — E1151 Type 2 diabetes mellitus with diabetic peripheral angiopathy without gangrene: Secondary | ICD-10-CM | POA: Diagnosis not present

## 2018-10-01 DIAGNOSIS — L97422 Non-pressure chronic ulcer of left heel and midfoot with fat layer exposed: Secondary | ICD-10-CM | POA: Diagnosis not present

## 2018-10-01 DIAGNOSIS — Z4800 Encounter for change or removal of nonsurgical wound dressing: Secondary | ICD-10-CM | POA: Diagnosis not present

## 2018-10-01 DIAGNOSIS — E1122 Type 2 diabetes mellitus with diabetic chronic kidney disease: Secondary | ICD-10-CM | POA: Diagnosis not present

## 2018-10-01 DIAGNOSIS — I132 Hypertensive heart and chronic kidney disease with heart failure and with stage 5 chronic kidney disease, or end stage renal disease: Secondary | ICD-10-CM | POA: Diagnosis not present

## 2018-10-01 DIAGNOSIS — E11621 Type 2 diabetes mellitus with foot ulcer: Secondary | ICD-10-CM | POA: Diagnosis not present

## 2018-10-02 ENCOUNTER — Other Ambulatory Visit: Payer: Self-pay

## 2018-10-02 DIAGNOSIS — Z23 Encounter for immunization: Secondary | ICD-10-CM | POA: Diagnosis not present

## 2018-10-02 DIAGNOSIS — N2581 Secondary hyperparathyroidism of renal origin: Secondary | ICD-10-CM | POA: Diagnosis not present

## 2018-10-02 DIAGNOSIS — Z992 Dependence on renal dialysis: Secondary | ICD-10-CM | POA: Diagnosis not present

## 2018-10-02 DIAGNOSIS — I739 Peripheral vascular disease, unspecified: Secondary | ICD-10-CM

## 2018-10-02 DIAGNOSIS — N186 End stage renal disease: Secondary | ICD-10-CM | POA: Diagnosis not present

## 2018-10-02 DIAGNOSIS — E119 Type 2 diabetes mellitus without complications: Secondary | ICD-10-CM | POA: Diagnosis not present

## 2018-10-02 DIAGNOSIS — D509 Iron deficiency anemia, unspecified: Secondary | ICD-10-CM | POA: Diagnosis not present

## 2018-10-03 ENCOUNTER — Ambulatory Visit (INDEPENDENT_AMBULATORY_CARE_PROVIDER_SITE_OTHER)
Admission: RE | Admit: 2018-10-03 | Discharge: 2018-10-03 | Disposition: A | Discharge status: Home / Self Care | Payer: Medicare Other | Source: Ambulatory Visit | Attending: Family | Admitting: Family

## 2018-10-03 ENCOUNTER — Ambulatory Visit (HOSPITAL_COMMUNITY)
Admission: RE | Admit: 2018-10-03 | Discharge: 2018-10-03 | Disposition: A | Discharge status: Home / Self Care | Payer: Medicare Other | Source: Ambulatory Visit | Attending: Family | Admitting: Family

## 2018-10-03 ENCOUNTER — Ambulatory Visit (INDEPENDENT_AMBULATORY_CARE_PROVIDER_SITE_OTHER): Payer: Medicare Other | Admitting: Physician Assistant

## 2018-10-03 ENCOUNTER — Encounter: Payer: Self-pay | Admitting: *Deleted

## 2018-10-03 ENCOUNTER — Other Ambulatory Visit: Payer: Self-pay

## 2018-10-03 VITALS — BP 112/64 | HR 68 | Temp 97.9°F | Resp 20 | Ht 69.0 in | Wt 165.0 lb

## 2018-10-03 DIAGNOSIS — N186 End stage renal disease: Secondary | ICD-10-CM | POA: Diagnosis not present

## 2018-10-03 DIAGNOSIS — Z992 Dependence on renal dialysis: Secondary | ICD-10-CM

## 2018-10-03 DIAGNOSIS — I739 Peripheral vascular disease, unspecified: Secondary | ICD-10-CM

## 2018-10-03 DIAGNOSIS — L89522 Pressure ulcer of left ankle, stage 2: Secondary | ICD-10-CM

## 2018-10-03 NOTE — Progress Notes (Signed)
Postoperative Visit ([place])   History of Present Illness   Nathaniel White is a 58 y.o. male who presents to go over vascular studies.  He is status post [place] stenting of left popliteal artery by Dr. Donzetta Matters on 07/21/2018.  He however has extensive tissue loss of left foot with large heel ulcer as well as gangrenous second toe.  He is nonambulatory and wheelchair-bound over the last several months.  He denies pain in his foot at rest.  Pain in left heel only  when trying to stand and pivot.  He is on aspirin and Plavix daily.  His caregiver is also here during office visit today.  She expresses her frustration with the care he has been receiving and believes his leg should be amputated now if we feel like his wounds ultimately are not going to heal.  Past medical history also significant for end-stage renal disease on hemodialysis via left arm AV fistula.  He is dialyzing on a Tuesday Thursday Saturday schedule without complication.  Current Outpatient Medications  Medication Sig Dispense Refill  . acetaminophen (TYLENOL) 500 MG tablet Take 1 tablet (500 mg total) by mouth every 6 (six) hours as needed. 30 tablet 0  . albuterol (VENTOLIN HFA) 108 (90 Base) MCG/ACT inhaler Inhale 1-2 puffs into the lungs every 6 (six) hours as needed for wheezing or shortness of breath. 18 g 2  . aspirin EC 81 MG tablet Take 81 mg by mouth 3 (three) times a week.    . B Complex-C-Zn-Folic Acid (DIALYVITE 765 WITH ZINC) 0.8 MG TABS Take 1 tablet by mouth daily.    . clonazePAM (KLONOPIN) 1 MG tablet Take by mouth.    . clopidogrel (PLAVIX) 75 MG tablet Take 1 tablet (75 mg total) by mouth daily. 30 tablet 11  . Fluticasone-Salmeterol (ADVAIR) 250-50 MCG/DOSE AEPB Inhale 1 puff into the lungs 2 (two) times daily. 60 each 3  . FOSRENOL 1000 MG chewable tablet Chew 1,000 mg by mouth 3 (three) times daily with meals.   12  . glucose blood (ONE TOUCH ULTRA TEST) test strip     . hydroxypropyl methylcellulose /  hypromellose (ISOPTO TEARS / GONIOVISC) 2.5 % ophthalmic solution Place 1 drop into both eyes as needed for dry eyes.    Marland Kitchen loperamide (IMODIUM) 2 MG capsule Take 4 mg by mouth as needed for diarrhea or loose stools.    . montelukast (SINGULAIR) 10 MG tablet Take 1 tablet (10 mg total) by mouth at bedtime. 90 tablet 3  . rosuvastatin (CRESTOR) 10 MG tablet Take 1 tablet (10 mg total) by mouth at bedtime. 90 tablet 3  . SENSIPAR 30 MG tablet Take 30 mg by mouth 2 (two) times daily with a meal.     . silver sulfADIAZINE (SILVADENE) 1 % cream Apply topically daily to wound 50 g 0  . tadalafil (CIALIS) 5 MG tablet Take 5 mg by mouth daily as needed for erectile dysfunction.    . traMADol (ULTRAM) 50 MG tablet Take 50 mg by mouth 2 (two) times daily. Take one tablet by mouth 2x a day as needed.    . VELPHORO 500 MG chewable tablet CRUSH OR CHEW AND SWALLOW 2 TABLETS 3 TIMES A DAY WITH MEALS     No current facility-administered medications for this visit.     ROS: 10 system ROS is negative unless otherwise noted in HPI   For VQI Use Only   PRE-ADM LIVING: Home  AMB STATUS: Wheelchair  Physical Examination   Vitals:   10/03/18 1515  BP: 112/64  Pulse: 68  Resp: 20  Temp: 97.9 F (36.6 C)  SpO2: 99%  Weight: 165 lb (74.8 kg)  Height: 5\' 9"  (1.753 m)   Body mass index is 24.37 kg/m.  General Alert, O x 3, WD, NAD  Pulmonary Sym exp, good B air movt, CTA B  Cardiac RRR, Nl S1, S2  Vascular Vessel Right Left  Popliteal Not palpable Not palpable  PT Not palpable Not palpable  DP Not palpable Not palpable    Gastrointestinal soft, non-distended, non-tender to palpation,   Musculoskeletal M/S 5/5 throughout  , Dry gangrene left second toe with early mummification of toe tip; extensive tissue loss left heel with some odor unable to feel bone with palpation  Neurologic  Pain and light touch intact in extremities , Motor exam as listed above    Medical Decision Making   Nathaniel White is a 58 y.o. male who presents 2 months status post left popliteal stent; extensive tissue loss left foot.   Arterial duplex today demonstrates a patent popliteal stent with improved TBI however, left heel and second toe wound do not show improvement  Patient is already considered not to be a surgical candidate due to extensive tissue loss high likelihood for amputation  Despite strongly worded recommendations from caregiver to proceed with amputation of LLE, the patient clearly is not ready mentally to agree to this decision  The patient understandably would like to think about this and will follow-up for wound check with Dr. Donzetta Matters in about 3 to 4 weeks.  Continue current wound care  Continue aspirin and Plavix daily  Educated patient and caregiver on signs of systemic infection including fevers, chills, altered mental status, significant changes to wounds of left foot  Nathaniel Ligas PA-C [facility] Office: [phone]  Clinic MD: Dr. Donnetta Hutching

## 2018-10-04 DIAGNOSIS — D509 Iron deficiency anemia, unspecified: Secondary | ICD-10-CM | POA: Diagnosis not present

## 2018-10-04 DIAGNOSIS — N2581 Secondary hyperparathyroidism of renal origin: Secondary | ICD-10-CM | POA: Diagnosis not present

## 2018-10-04 DIAGNOSIS — N186 End stage renal disease: Secondary | ICD-10-CM | POA: Diagnosis not present

## 2018-10-04 DIAGNOSIS — Z992 Dependence on renal dialysis: Secondary | ICD-10-CM | POA: Diagnosis not present

## 2018-10-04 DIAGNOSIS — E119 Type 2 diabetes mellitus without complications: Secondary | ICD-10-CM | POA: Diagnosis not present

## 2018-10-04 DIAGNOSIS — Z23 Encounter for immunization: Secondary | ICD-10-CM | POA: Diagnosis not present

## 2018-10-07 DIAGNOSIS — E119 Type 2 diabetes mellitus without complications: Secondary | ICD-10-CM | POA: Diagnosis not present

## 2018-10-07 DIAGNOSIS — N186 End stage renal disease: Secondary | ICD-10-CM | POA: Diagnosis not present

## 2018-10-07 DIAGNOSIS — Z23 Encounter for immunization: Secondary | ICD-10-CM | POA: Diagnosis not present

## 2018-10-07 DIAGNOSIS — N2581 Secondary hyperparathyroidism of renal origin: Secondary | ICD-10-CM | POA: Diagnosis not present

## 2018-10-07 DIAGNOSIS — D509 Iron deficiency anemia, unspecified: Secondary | ICD-10-CM | POA: Diagnosis not present

## 2018-10-07 DIAGNOSIS — Z992 Dependence on renal dialysis: Secondary | ICD-10-CM | POA: Diagnosis not present

## 2018-10-08 DIAGNOSIS — L97422 Non-pressure chronic ulcer of left heel and midfoot with fat layer exposed: Secondary | ICD-10-CM | POA: Diagnosis not present

## 2018-10-08 DIAGNOSIS — Z4800 Encounter for change or removal of nonsurgical wound dressing: Secondary | ICD-10-CM | POA: Diagnosis not present

## 2018-10-08 DIAGNOSIS — E11621 Type 2 diabetes mellitus with foot ulcer: Secondary | ICD-10-CM | POA: Diagnosis not present

## 2018-10-08 DIAGNOSIS — I132 Hypertensive heart and chronic kidney disease with heart failure and with stage 5 chronic kidney disease, or end stage renal disease: Secondary | ICD-10-CM | POA: Diagnosis not present

## 2018-10-08 DIAGNOSIS — E1122 Type 2 diabetes mellitus with diabetic chronic kidney disease: Secondary | ICD-10-CM | POA: Diagnosis not present

## 2018-10-08 DIAGNOSIS — E1151 Type 2 diabetes mellitus with diabetic peripheral angiopathy without gangrene: Secondary | ICD-10-CM | POA: Diagnosis not present

## 2018-10-09 ENCOUNTER — Other Ambulatory Visit: Payer: Self-pay

## 2018-10-09 ENCOUNTER — Encounter (HOSPITAL_BASED_OUTPATIENT_CLINIC_OR_DEPARTMENT_OTHER): Payer: Medicare Other | Attending: Internal Medicine | Admitting: Internal Medicine

## 2018-10-09 DIAGNOSIS — I132 Hypertensive heart and chronic kidney disease with heart failure and with stage 5 chronic kidney disease, or end stage renal disease: Secondary | ICD-10-CM | POA: Diagnosis not present

## 2018-10-09 DIAGNOSIS — N186 End stage renal disease: Secondary | ICD-10-CM | POA: Insufficient documentation

## 2018-10-09 DIAGNOSIS — Z992 Dependence on renal dialysis: Secondary | ICD-10-CM | POA: Diagnosis not present

## 2018-10-09 DIAGNOSIS — Z7902 Long term (current) use of antithrombotics/antiplatelets: Secondary | ICD-10-CM | POA: Diagnosis not present

## 2018-10-09 DIAGNOSIS — I87332 Chronic venous hypertension (idiopathic) with ulcer and inflammation of left lower extremity: Secondary | ICD-10-CM | POA: Insufficient documentation

## 2018-10-09 DIAGNOSIS — E11621 Type 2 diabetes mellitus with foot ulcer: Secondary | ICD-10-CM | POA: Diagnosis not present

## 2018-10-09 DIAGNOSIS — N2581 Secondary hyperparathyroidism of renal origin: Secondary | ICD-10-CM | POA: Diagnosis not present

## 2018-10-09 DIAGNOSIS — I509 Heart failure, unspecified: Secondary | ICD-10-CM | POA: Insufficient documentation

## 2018-10-09 DIAGNOSIS — Z7982 Long term (current) use of aspirin: Secondary | ICD-10-CM | POA: Diagnosis not present

## 2018-10-09 DIAGNOSIS — I96 Gangrene, not elsewhere classified: Secondary | ICD-10-CM | POA: Diagnosis not present

## 2018-10-09 DIAGNOSIS — J449 Chronic obstructive pulmonary disease, unspecified: Secondary | ICD-10-CM | POA: Diagnosis not present

## 2018-10-09 DIAGNOSIS — E1122 Type 2 diabetes mellitus with diabetic chronic kidney disease: Secondary | ICD-10-CM | POA: Insufficient documentation

## 2018-10-09 DIAGNOSIS — T8612 Kidney transplant failure: Secondary | ICD-10-CM | POA: Diagnosis not present

## 2018-10-09 DIAGNOSIS — E119 Type 2 diabetes mellitus without complications: Secondary | ICD-10-CM | POA: Diagnosis not present

## 2018-10-09 DIAGNOSIS — L97526 Non-pressure chronic ulcer of other part of left foot with bone involvement without evidence of necrosis: Secondary | ICD-10-CM | POA: Diagnosis not present

## 2018-10-09 DIAGNOSIS — E1152 Type 2 diabetes mellitus with diabetic peripheral angiopathy with gangrene: Secondary | ICD-10-CM | POA: Diagnosis not present

## 2018-10-09 DIAGNOSIS — L97425 Non-pressure chronic ulcer of left heel and midfoot with muscle involvement without evidence of necrosis: Secondary | ICD-10-CM | POA: Diagnosis not present

## 2018-10-09 DIAGNOSIS — E1151 Type 2 diabetes mellitus with diabetic peripheral angiopathy without gangrene: Secondary | ICD-10-CM | POA: Diagnosis not present

## 2018-10-09 DIAGNOSIS — D509 Iron deficiency anemia, unspecified: Secondary | ICD-10-CM | POA: Diagnosis not present

## 2018-10-09 DIAGNOSIS — L97423 Non-pressure chronic ulcer of left heel and midfoot with necrosis of muscle: Secondary | ICD-10-CM | POA: Insufficient documentation

## 2018-10-09 DIAGNOSIS — L97422 Non-pressure chronic ulcer of left heel and midfoot with fat layer exposed: Secondary | ICD-10-CM | POA: Diagnosis not present

## 2018-10-09 DIAGNOSIS — Z4800 Encounter for change or removal of nonsurgical wound dressing: Secondary | ICD-10-CM | POA: Diagnosis not present

## 2018-10-09 NOTE — Progress Notes (Signed)
Nathaniel White, Nathaniel White ([id number]) Visit Report for 10/09/2018 Debridement Details Patient Name: Date of Service: Nathaniel White, Nathaniel White 10/09/2018 3:00 PM Medical Record [id number] Patient Account Number: [id number] Date of Birth/Sex: Treating RN: 11/01/1960 (58 y.o. [name] Primary Care Provider: Geryl Rankins Other Clinician: Referring Provider: Treating Provider/Extender:Robson, Rodrigo Ran, Paulette Blanch in Treatment: 14 Debridement Performed for Wound #1 Left,Lateral Calcaneus Assessment: Performed By: Physician Ricard Dillon., MD Debridement Type: Debridement Severity of Tissue Pre Fat layer exposed Debridement: Level of Consciousness (Pre- Awake and Alert procedure): Pre-procedure Verification/Time Out Taken: Yes - 14:50 Start Time: 14:51 Pain Control: Lidocaine 4% Topical Solution Total Area Debrided (L x W): 5 (cm) x 4 (cm) = 20 (cm) Tissue and other material Non-Viable, Eschar, Slough, Tendon, Slough debrided: Level: Skin/Subcutaneous Tissue/Muscle Debridement Description: Excisional Instrument: Blade, Forceps Bleeding: Moderate Hemostasis Achieved: Pressure End Time: 14:58 Procedural Pain: 0 Post Procedural Pain: 0 Response to Treatment: Procedure was tolerated well Level of Consciousness Awake and Alert (Post-procedure): Post Debridement Measurements of Total Wound Length: (cm) 6.9 Width: (cm) 14 Depth: (cm) 1.3 Volume: (cm) 98.63 Character of Wound/Ulcer Post Requires Further Debridement Debridement: Severity of Tissue Post Debridement: Fat layer exposed Post Procedure Diagnosis Same as Pre-procedure Electronic Signature(s) Signed: 10/09/2018 6:13:48 PM By: Linton Ham MD Signed: 10/09/2018 6:31:45 PM By: Deon Pilling Entered By: Deon Pilling on 10/09/2018 17:00:48 -------------------------------------------------------------------------------- HPI Details Patient Name: Date of Service: Nathaniel White 10/09/2018 3:00  PM Medical Record [id number] Patient Account Number: [id number] Date of Birth/Sex: Treating RN: 02/04/1960 (58 y.o. M) Primary Care Provider: Geryl Rankins Other Clinician: Referring Provider: Treating Provider/Extender:Robson, Rodrigo Ran, Paulette Blanch in Treatment: 14 History of Present Illness HPI Description: ADMISSION 06/30/2018 The patient is a 58 year old man with multiple medical issues including type 2 diabetes on dialysis. Currently the patient his dialysis is actually caused by nephrosclerosis and he has been on dialysis for 20 years. Roughly a month ago according to the patient he noticed a small open area on his left heel he showed it to his caregiver and according to her this rapidly expanded. Looking through [facility] health link he was in the ER with left foot pain on 4/3. An x-ray was negative noting only pedal vessel atherosclerosis. He was also seen on 4/22 at that point he was felt to have a cellulitis and abscess of the left foot he was given Silvadene. After her third visit to the ER and a visit to podiatry [Dr. Price] he was referred here. He has been using Silvadene. The patient thinks that he was bitten by something initially, but he did not actually see this The patient has a history of type 2 diabetes although he is not currently on anything for this and his hemoglobin A1c was 5; COPD, dilated cardiomyopathy, mitral regurgitation, CHF, remote CVA with left-sided hemiparesis, hypertension ABI in our clinic was not easy to obtain although we were able to get 0.93 on the left 6/29; I did a debridement on this last time however once again it he comes in with a mostly black eschar at surface. Very tender. We have not yet managed to get arterial studies on him. I will see if I can get them done through Dr. Kennon Holter office 7/9; his arterial studies were done on 7/1. On the right side his studies are not too bad with noncompressible ABIs at the PTA great TBI of 0.64  and monophasic waveforms on the left however an ABI could not be obtained at the ATA, at the PTA his  ABI was 0.2 with a dampened monophasic waveforms there was no pressure in the great toe. He is developed a gangrenous-looking second toes since the last time I saw this patient. There is an ulcer on the medial aspect the second toe. The area on his heel is declining 7/21; since the patient was last here he was revascularized by Dr. Donzetta Matters on 07/21/2018. Noteworthy on the left that the SFA was approximately 50% stenosed at the takeoff but this was not treated. Popliteal artery had heavy areas of calcification and included in one segment and reconstituted just beyond this. This area was stented. After stenting there was 0% residual stenosis. It was noted he appeared to have three-vessel runoff via the tibials which are heavily calcified but without focal occlusions. The patient does not complain of pain. The area on the left lateral heel is a deep necrotic wound it does not look any more viable this week. He has a gangrenous second toe which I do not think is going to be viable. Finally he has a new area on the left posterior calf 7/28; patient arrives with the mummified second toe. The large necrotic wound on the left lateral calcaneus is separating black eschar. He had a new area superiorly on the posterior calf. Home health could not get Iodoflex and he arrived with calcium alginate. He has been revascularized by Dr. Donzetta Matters patient is not aware when his follow-up appointment is. 8/4-Patient is back to the clinic with the mummified second toe on the left, left calcaneal ulcer with black eschar and visible tendon. His appointment with Dr. Gwenlyn Saran is on the 14th of this month. I have indicated to him that this is important to keep 8/11-8/11-Patient comes back for his left medial ankle wound, the more distal wound is bigger, has a lot of necrotic tissue, greenish drainage, eschar coating a portion of the  wound 8/18; I believe the patient has had follow-up arterial studies ordered by Dr. Donzetta Matters but I do not think they have been done and the patient and his friend are not aware of an appointment time. He has a mummified left second toe and a small area on the left posterior calf in the mid aspect. This substantial area is on the left Achilles area extending into the left lateral heel. This is a deep necrotic wound. It no longer has the black gangrenous surface. His friend says that there is still a malodorous discharge. I note that Dr. Evette Doffing did a culture of this area on 8/12. This grew Enterococcus faecalis and E. coli. He is started on Augmentin although the E. coli only has intermediate sensitivities here. He was referred to infectious disease. We could add either a first or third generation cephalosporin which should cover the E. coli although I did not do this today. 8/25; the patient has a mummified left second toe a substantial wound over the left Achilles area with exposed tendon. And a small area on the left posterior calf. He has been revascularized by Dr. Donzetta Matters in July. He has not made it for his follow-up noninvasive studies. X-ray I did of the left heel last week showed soft tissue gas along the posterior aspect of the heel and soft tissue defect and findings suspicious for osteomyelitis involving the distal aspect of the second proximal phalanx. This is the mummified toe. The patient went to see Dr. Linus Salmons of infectious disease. I am not able to really follow the flow of his note but according to when I am seeing  he did not add or suggest any additional antibiotics unless he called these into dialysis. 9/1; I was able to review Dr. Henreitta Leber notes he did not feel any further antibiotics were indicated. I still do not see where the patient had his postprocedure noninvasive studies. We have been using silver collagen to the substantial area on the left Achilles. This has exposed tendon. He  has a mummified left second toe. I am doubtful there is any viable tissue here. The patient is nonambulatory. He has flexion contractures of his left hip and knee. 10/1; the patient has not been here in a month I am really not sure why. He was seen by vein and vascular on 9/25 he had his noninvasive studies done. His monophasic waveforms on the left were noted. He had Dopplers across the stent which was felt to be patent. The patient was seen by Dagoberto Ligas PAC. The arterial duplex today demonstrated a patent popliteal stent with improved TBI. Apparently his caregiver demanded an amputation but this was not felt to be necessary. He is on aspirin and Plavix. Dressings are being changed by home health he was using Prisma. Electronic Signature(s) Signed: 10/09/2018 6:13:48 PM By: Linton Ham MD Entered By: Linton Ham on 10/09/2018 17:42:12 -------------------------------------------------------------------------------- Physical Exam Details Patient Name: Date of Service: Nathaniel White, Nathaniel White 10/09/2018 3:00 PM Medical Record [id number] Patient Account Number: [id number] Date of Birth/Sex: Treating RN: 1960/01/22 (58 y.o. M) Primary Care Provider: Geryl Rankins Other Clinician: Referring Provider: Treating Provider/Extender:Robson, Rodrigo Ran, [name] in Treatment: 14 Constitutional Sitting or standing Blood Pressure is within target range for patient.. Pulse regular and within target range for patient.Marland Kitchen Respirations regular, non-labored and within target range.. Temperature is normal and within the target range for the patient.Marland Kitchen Appears in no distress. Cardiovascular Pedal pulses absent on the left.. Notes Wound exam; the patient has a large area on the left Achilles. I remove devitalized tendon with pickups and a scalpel. Then using a #5 curette necrotic subcutaneous debris. Bleeding was fairly brisk the wound extends laterally into the left lateral calcaneus.  The left second toe still has dry gangrene/mummified. The area on the left posterior calf from last time has closed Electronic Signature(s) Signed: 10/09/2018 6:13:48 PM By: Linton Ham MD Entered By: Linton Ham on 10/09/2018 17:45:23 -------------------------------------------------------------------------------- Physician Orders Details Patient Name: Date of Service: Nathaniel White, Nathaniel White 10/09/2018 3:00 PM Medical Record [phone] Patient Account Number: [id number] Date of Birth/Sex: Treating RN: May 26, 1960 (58 y.o. [name] Primary Care Provider: Geryl Rankins Other Clinician: Referring Provider: Treating Provider/Extender:Robson, Rodrigo Ran, Paulette Blanch in Treatment: 14 Verbal / Phone Orders: No Diagnosis Coding ICD-10 Coding Code Description E11.621 Type 2 diabetes mellitus with foot ulcer E11.52 Type 2 diabetes mellitus with diabetic peripheral angiopathy with gangrene I87.333 Chronic venous hypertension (idiopathic) with ulcer and inflammation of bilateral lower extremity L97.423 Non-pressure chronic ulcer of left heel and midfoot with necrosis of muscle L97.221 Non-pressure chronic ulcer of left calf limited to breakdown of skin Follow-up Appointments Return Appointment in 1 week. Dressing Change Frequency Wound #1 Left,Lateral Calcaneus Change dressing three times week. Wound #2 Left Toe Second Change dressing three times week. Wound Cleansing Wound #1 Left,Lateral Calcaneus Clean wound with Wound Cleanser - or normal saline Wound #2 Left Toe Second Clean wound with Wound Cleanser - or normal saline Primary Wound Dressing Wound #1 Left,Lateral Calcaneus Calcium Alginate with Silver Wound #2 Left Toe Second Calcium Alginate with Silver Secondary Dressing Wound #1 Left,Lateral Calcaneus Kerlix/Rolled Gauze Dry Gauze  Wound #2 Left Toe Second Kerlix/Rolled Gauze Dry Gauze Off-Loading Other: - float feet off of bed/chair by placing  pillow under legs [facility] for wound care. - [facility] Electronic Signature(s) Signed: 10/09/2018 6:13:48 PM By: Linton Ham MD Signed: 10/09/2018 6:31:45 PM By: Deon Pilling Entered By: Deon Pilling on 10/09/2018 17:03:49 -------------------------------------------------------------------------------- Problem List Details Patient Name: Date of Service: Nathaniel White, Nathaniel White 10/09/2018 3:00 PM Medical Record [id number] Patient Account Number: [id number] Date of Birth/Sex: Treating RN: 11/20/1960 (59 y.o. Lorette Ang, Tammi Klippel Primary Care Provider: Geryl Rankins Other Clinician: Referring Provider: Treating Provider/Extender:Robson, Rodrigo Ran, Paulette Blanch in Treatment: 14 Active Problems ICD-10 Evaluated Encounter Code Description Active Date Today Diagnosis E11.621 Type 2 diabetes mellitus with foot ulcer 06/30/2018 No Yes E11.52 Type 2 diabetes mellitus with diabetic peripheral 07/17/2018 No Yes angiopathy with gangrene I87.333 Chronic venous hypertension (idiopathic) with ulcer 06/30/2018 No Yes and inflammation of bilateral lower extremity L97.423 Non-pressure chronic ulcer of left heel and midfoot 07/29/2018 No Yes with necrosis of muscle L97.221 Non-pressure chronic ulcer of left calf limited to 07/29/2018 No Yes breakdown of skin Inactive Problems ICD-10 Code Description Active Date Inactive Date E11.51 Type 2 diabetes mellitus with diabetic peripheral angiopathy 06/30/2018 06/30/2018 without gangrene Resolved Problems Electronic Signature(s) Signed: 10/09/2018 6:13:48 PM By: Linton Ham MD Entered By: Linton Ham on 10/09/2018 17:37:58 -------------------------------------------------------------------------------- Progress Note Details Patient Name: Date of Service: Nathaniel White 10/09/2018 3:00 PM Medical Record [id number] Patient Account Number: [id number] Date of Birth/Sex: Treating RN: 16-Jun-1960 (58  y.o. M) Primary Care Provider: Geryl Rankins Other Clinician: Referring Provider: Treating Provider/Extender:Robson, Rodrigo Ran, Paulette Blanch in Treatment: 14 Subjective History of Present Illness (HPI) ADMISSION 06/30/2018 The patient is a 58 year old man with multiple medical issues including type 2 diabetes on dialysis. Currently the patient his dialysis is actually caused by nephrosclerosis and he has been on dialysis for 20 years. Roughly a month ago according to the patient he noticed a small open area on his left heel he showed it to his caregiver and according to her this rapidly expanded. Looking through [facility] health link he was in the ER with left foot pain on 4/3. An x-ray was negative noting only pedal vessel atherosclerosis. He was also seen on 4/22 at that point he was felt to have a cellulitis and abscess of the left foot he was given Silvadene. After her third visit to the ER and a visit to podiatry [Dr. Price] he was referred here. He has been using Silvadene. The patient thinks that he was bitten by something initially, but he did not actually see this The patient has a history of type 2 diabetes although he is not currently on anything for this and his hemoglobin A1c was 5; COPD, dilated cardiomyopathy, mitral regurgitation, CHF, remote CVA with left-sided hemiparesis, hypertension ABI in our clinic was not easy to obtain although we were able to get 0.93 on the left 6/29; I did a debridement on this last time however once again it he comes in with a mostly black eschar at surface. Very tender. We have not yet managed to get arterial studies on him. I will see if I can get them done through Dr. Kennon Holter office 7/9; his arterial studies were done on 7/1. On the right side his studies are not too bad with noncompressible ABIs at the PTA great TBI of 0.64 and monophasic waveforms on the left however an ABI could not be obtained at the ATA, at the PTA  his ABI was 0.2 with  a dampened monophasic waveforms there was no pressure in the great toe. He is developed a gangrenous-looking second toes since the last time I saw this patient. There is an ulcer on the medial aspect the second toe. The area on his heel is declining 7/21; since the patient was last here he was revascularized by Dr. Donzetta Matters on 07/21/2018. Noteworthy on the left that the SFA was approximately 50% stenosed at the takeoff but this was not treated. Popliteal artery had heavy areas of calcification and included in one segment and reconstituted just beyond this. This area was stented. After stenting there was 0% residual stenosis. It was noted he appeared to have three-vessel runoff via the tibials which are heavily calcified but without focal occlusions. The patient does not complain of pain. The area on the left lateral heel is a deep necrotic wound it does not look any more viable this week. He has a gangrenous second toe which I do not think is going to be viable. Finally he has a new area on the left posterior calf 7/28; patient arrives with the mummified second toe. The large necrotic wound on the left lateral calcaneus is separating black eschar. He had a new area superiorly on the posterior calf. Home health could not get Iodoflex and he arrived with calcium alginate. He has been revascularized by Dr. Donzetta Matters patient is not aware when his follow-up appointment is. 8/4-Patient is back to the clinic with the mummified second toe on the left, left calcaneal ulcer with black eschar and visible tendon. His appointment with Dr. Gwenlyn Saran is on the 14th of this month. I have indicated to him that this is important to keep 8/11-8/11-Patient comes back for his left medial ankle wound, the more distal wound is bigger, has a lot of necrotic tissue, greenish drainage, eschar coating a portion of the wound 8/18; I believe the patient has had follow-up arterial studies ordered by Dr. Donzetta Matters but I do not think they have  been done and the patient and his friend are not aware of an appointment time. He has a mummified left second toe and a small area on the left posterior calf in the mid aspect. This substantial area is on the left Achilles area extending into the left lateral heel. This is a deep necrotic wound. It no longer has the black gangrenous surface. His friend says that there is still a malodorous discharge. I note that Dr. Evette Doffing did a culture of this area on 8/12. This grew Enterococcus faecalis and E. coli. He is started on Augmentin although the E. coli only has intermediate sensitivities here. He was referred to infectious disease. We could add either a first or third generation cephalosporin which should cover the E. coli although I did not do this today. 8/25; the patient has a mummified left second toe a substantial wound over the left Achilles area with exposed tendon. And a small area on the left posterior calf. He has been revascularized by Dr. Donzetta Matters in July. He has not made it for his follow-up noninvasive studies. X-ray I did of the left heel last week showed soft tissue gas along the posterior aspect of the heel and soft tissue defect and findings suspicious for osteomyelitis involving the distal aspect of the second proximal phalanx. This is the mummified toe. The patient went to see Dr. Linus Salmons of infectious disease. I am not able to really follow the flow of his note but according to when I  am seeing he did not add or suggest any additional antibiotics unless he called these into dialysis. 9/1; I was able to review Dr. Henreitta Leber notes he did not feel any further antibiotics were indicated. I still do not see where the patient had his postprocedure noninvasive studies. We have been using silver collagen to the substantial area on the left Achilles. This has exposed tendon. He has a mummified left second toe. I am doubtful there is any viable tissue here. The patient is nonambulatory. He has  flexion contractures of his left hip and knee. 10/1; the patient has not been here in a month I am really not sure why. He was seen by vein and vascular on 9/25 he had his noninvasive studies done. His monophasic waveforms on the left were noted. He had Dopplers across the stent which was felt to be patent. The patient was seen by Dagoberto Ligas PAC. The arterial duplex today demonstrated a patent popliteal stent with improved TBI. Apparently his caregiver demanded an amputation but this was not felt to be necessary. He is on aspirin and Plavix. Dressings are being changed by home health he was using Prisma. Objective Constitutional Sitting or standing Blood Pressure is within target range for patient.. Pulse regular and within target range for patient.Marland Kitchen Respirations regular, non-labored and within target range.. Temperature is normal and within the target range for the patient.Marland Kitchen Appears in no distress. Vitals Time Taken: 4:15 PM, Height: 69 in, Weight: 170 lbs, BMI: 25.1, Temperature: 98.4 F, Pulse: 104 bpm, Respiratory Rate: 19 breaths/min, Blood Pressure: 126/66 mmHg. Cardiovascular Pedal pulses absent on the left.. General Notes: Wound exam; the patient has a large area on the left Achilles. I remove devitalized tendon with pickups and a scalpel. Then using a #5 curette necrotic subcutaneous debris. Bleeding was fairly brisk the wound extends laterally into the left lateral calcaneus. The left second toe still has dry gangrene/mummified. The area on the left posterior calf from last time has closed Integumentary (Hair, Skin) Wound #1 status is Open. Original cause of wound was Gradually Appeared. The wound is located on the Left,Lateral Calcaneus. The wound measures 6.9cm length x 14cm width x 1.3cm depth; 75.869cm^2 area and 98.63cm^3 volume. There is muscle, tendon, and Fat Layer (Subcutaneous Tissue) Exposed exposed. There is no tunneling noted, however, there is undermining  starting at 10:00 and ending at 1:00 with a maximum distance of 1.6cm. There is a medium amount of purulent drainage noted. Foul odor after cleansing was noted. The wound margin is well defined and not attached to the wound base. There is medium (34-66%) red granulation within the wound bed. There is a medium (34-66%) amount of necrotic tissue within the wound bed including Eschar, Adherent Slough and Necrosis of Muscle. Wound #2 status is Open. Original cause of wound was Pressure Injury. The wound is located on the Left Toe Second. The wound measures 2.5cm length x 6cm width x 0.2cm depth; 11.781cm^2 area and 2.356cm^3 volume. There is Fat Layer (Subcutaneous Tissue) Exposed exposed. There is no tunneling or undermining noted. There is a small amount of purulent drainage noted. The wound margin is distinct with the outline attached to the wound base. There is no granulation within the wound bed. There is a large (67-100%) amount of necrotic tissue within the wound bed including Eschar. Wound #3 status is Open. Original cause of wound was Gradually Appeared. The wound is located on the Left,Posterior Lower Leg. The wound measures 0cm length x 0cm width x 0cm  depth; 0cm^2 area and 0cm^3 volume. There is no tunneling or undermining noted. There is a none present amount of drainage noted. The wound margin is flat and intact. There is no granulation within the wound bed. There is no necrotic tissue within the wound bed. Assessment Active Problems ICD-10 Type 2 diabetes mellitus with foot ulcer Type 2 diabetes mellitus with diabetic peripheral angiopathy with gangrene Chronic venous hypertension (idiopathic) with ulcer and inflammation of bilateral lower extremity Non-pressure chronic ulcer of left heel and midfoot with necrosis of muscle Non-pressure chronic ulcer of left calf limited to breakdown of skin Procedures Wound #1 Pre-procedure diagnosis of Wound #1 is a Diabetic Wound/Ulcer of the  Lower Extremity located on the Left,Lateral Calcaneus .Severity of Tissue Pre Debridement is: Fat layer exposed. There was a Excisional Skin/Subcutaneous Tissue/Muscle Debridement with a total area of 20 sq cm performed by Ricard Dillon., MD. With the following instrument(s): Blade, and Forceps to remove Non-Viable tissue/material. Material removed includes Eschar, Tendon, and Slough after achieving pain control using Lidocaine 4% Topical Solution. A time out was conducted at 14:50, prior to the start of the procedure. A Moderate amount of bleeding was controlled with Pressure. The procedure was tolerated well with a pain level of 0 throughout and a pain level of 0 following the procedure. Post Debridement Measurements: 6.9cm length x 14cm width x 1.3cm depth; 98.63cm^3 volume. Character of Wound/Ulcer Post Debridement requires further debridement. Severity of Tissue Post Debridement is: Fat layer exposed. Post procedure Diagnosis Wound #1: Same as Pre-Procedure Plan Follow-up Appointments: Return Appointment in 1 week. Dressing Change Frequency: Wound #1 Left,Lateral Calcaneus: Change dressing three times week. Wound #2 Left Toe Second: Change dressing three times week. Wound Cleansing: Wound #1 Left,Lateral Calcaneus: Clean wound with Wound Cleanser - or normal saline Wound #2 Left Toe Second: Clean wound with Wound Cleanser - or normal saline Primary Wound Dressing: Wound #1 Left,Lateral Calcaneus: Calcium Alginate with Silver Wound #2 Left Toe Second: Calcium Alginate with Silver Secondary Dressing: Wound #1 Left,Lateral Calcaneus: Kerlix/Rolled Gauze Dry Gauze Wound #2 Left Toe Second: Kerlix/Rolled Gauze Dry Gauze Off-Loading: Other: - float feet off of bed/chair by placing pillow under legs Home Health: [facility] for wound care. - [facility] 1. Change the dressing back to silver alginate because of the soupy debris in the left Achilles  area. 2. The patient is asking about hyperbarics. He is a type II diabetic historically although is not on any current treatment. I checked his records in [facility] link on this. He has known PAD and follows with vascular surgery. They saw him since the last time he was here. Apparently while they were there somebody demanded an amputation but it was not felt that currently an amputation is necessary. 3. He has an appointment with Dr. Donzetta Matters in follow-up in 3 to 4 weeks. 4. 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 I am not certain whether logistically they could get him here even if I do get the approval for this. Electronic Signature(s) Signed: 10/09/2018 6:13:48 PM By: Linton Ham MD Entered By: Linton Ham on 10/09/2018 17:50:30 -------------------------------------------------------------------------------- SuperBill Details Patient Name: Date of Service: Nathaniel White, Nathaniel White 10/09/2018 Medical Record [id number] Patient Account Number: [id number] Date of Birth/Sex: Treating RN: September 12, 1960 (58 y.o. 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 L97.221 Non-pressure chronic ulcer of left calf limited to breakdown of skin Facility Procedures CPT4 Code Description: 34373578 11043 - DEB MUSC/FASCIA 20 SQ CM/< ICD-10 Diagnosis Description L97.423 Non-pressure chronic ulcer of left heel and midfoot with Modifier:  necrosis of mus Quantity: 1 cle Physician Procedures CPT4 Code Description: 9784784 12820 - WC PHYS DEBR MUSCLE/FASCIA 20 SQ CM ICD-10 Diagnosis Description L97.423 Non-pressure chronic ulcer of left heel and midfoot with nec Modifier: rosis of muscl Quantity: 1 e Electronic Signature(s) Signed: 10/09/2018 6:13:48 PM By: Linton Ham MD Entered By: Linton Ham on 10/09/2018 17:50:50

## 2018-10-10 DIAGNOSIS — I132 Hypertensive heart and chronic kidney disease with heart failure and with stage 5 chronic kidney disease, or end stage renal disease: Secondary | ICD-10-CM | POA: Diagnosis not present

## 2018-10-10 DIAGNOSIS — Z4800 Encounter for change or removal of nonsurgical wound dressing: Secondary | ICD-10-CM | POA: Diagnosis not present

## 2018-10-10 DIAGNOSIS — E11621 Type 2 diabetes mellitus with foot ulcer: Secondary | ICD-10-CM | POA: Diagnosis not present

## 2018-10-10 DIAGNOSIS — E1122 Type 2 diabetes mellitus with diabetic chronic kidney disease: Secondary | ICD-10-CM | POA: Diagnosis not present

## 2018-10-10 DIAGNOSIS — L97422 Non-pressure chronic ulcer of left heel and midfoot with fat layer exposed: Secondary | ICD-10-CM | POA: Diagnosis not present

## 2018-10-10 DIAGNOSIS — E1151 Type 2 diabetes mellitus with diabetic peripheral angiopathy without gangrene: Secondary | ICD-10-CM | POA: Diagnosis not present

## 2018-10-11 DIAGNOSIS — N2581 Secondary hyperparathyroidism of renal origin: Secondary | ICD-10-CM | POA: Diagnosis not present

## 2018-10-11 DIAGNOSIS — D509 Iron deficiency anemia, unspecified: Secondary | ICD-10-CM | POA: Diagnosis not present

## 2018-10-11 DIAGNOSIS — N186 End stage renal disease: Secondary | ICD-10-CM | POA: Diagnosis not present

## 2018-10-11 DIAGNOSIS — Z992 Dependence on renal dialysis: Secondary | ICD-10-CM | POA: Diagnosis not present

## 2018-10-11 DIAGNOSIS — E119 Type 2 diabetes mellitus without complications: Secondary | ICD-10-CM | POA: Diagnosis not present

## 2018-10-14 DIAGNOSIS — E119 Type 2 diabetes mellitus without complications: Secondary | ICD-10-CM | POA: Diagnosis not present

## 2018-10-14 DIAGNOSIS — Z992 Dependence on renal dialysis: Secondary | ICD-10-CM | POA: Diagnosis not present

## 2018-10-14 DIAGNOSIS — D509 Iron deficiency anemia, unspecified: Secondary | ICD-10-CM | POA: Diagnosis not present

## 2018-10-14 DIAGNOSIS — N186 End stage renal disease: Secondary | ICD-10-CM | POA: Diagnosis not present

## 2018-10-14 DIAGNOSIS — N2581 Secondary hyperparathyroidism of renal origin: Secondary | ICD-10-CM | POA: Diagnosis not present

## 2018-10-15 DIAGNOSIS — E1151 Type 2 diabetes mellitus with diabetic peripheral angiopathy without gangrene: Secondary | ICD-10-CM | POA: Diagnosis not present

## 2018-10-15 DIAGNOSIS — I132 Hypertensive heart and chronic kidney disease with heart failure and with stage 5 chronic kidney disease, or end stage renal disease: Secondary | ICD-10-CM | POA: Diagnosis not present

## 2018-10-15 DIAGNOSIS — E11621 Type 2 diabetes mellitus with foot ulcer: Secondary | ICD-10-CM | POA: Diagnosis not present

## 2018-10-15 DIAGNOSIS — Z4800 Encounter for change or removal of nonsurgical wound dressing: Secondary | ICD-10-CM | POA: Diagnosis not present

## 2018-10-15 DIAGNOSIS — E1122 Type 2 diabetes mellitus with diabetic chronic kidney disease: Secondary | ICD-10-CM | POA: Diagnosis not present

## 2018-10-15 DIAGNOSIS — L97422 Non-pressure chronic ulcer of left heel and midfoot with fat layer exposed: Secondary | ICD-10-CM | POA: Diagnosis not present

## 2018-10-16 DIAGNOSIS — E119 Type 2 diabetes mellitus without complications: Secondary | ICD-10-CM | POA: Diagnosis not present

## 2018-10-16 DIAGNOSIS — D509 Iron deficiency anemia, unspecified: Secondary | ICD-10-CM | POA: Diagnosis not present

## 2018-10-16 DIAGNOSIS — N186 End stage renal disease: Secondary | ICD-10-CM | POA: Diagnosis not present

## 2018-10-16 DIAGNOSIS — Z992 Dependence on renal dialysis: Secondary | ICD-10-CM | POA: Diagnosis not present

## 2018-10-16 DIAGNOSIS — N2581 Secondary hyperparathyroidism of renal origin: Secondary | ICD-10-CM | POA: Diagnosis not present

## 2018-10-17 ENCOUNTER — Encounter (HOSPITAL_BASED_OUTPATIENT_CLINIC_OR_DEPARTMENT_OTHER): Payer: Medicare Other | Admitting: Internal Medicine

## 2018-10-17 ENCOUNTER — Other Ambulatory Visit: Payer: Self-pay

## 2018-10-17 DIAGNOSIS — E1151 Type 2 diabetes mellitus with diabetic peripheral angiopathy without gangrene: Secondary | ICD-10-CM | POA: Diagnosis not present

## 2018-10-17 DIAGNOSIS — E1152 Type 2 diabetes mellitus with diabetic peripheral angiopathy with gangrene: Secondary | ICD-10-CM | POA: Diagnosis not present

## 2018-10-17 DIAGNOSIS — L97422 Non-pressure chronic ulcer of left heel and midfoot with fat layer exposed: Secondary | ICD-10-CM | POA: Diagnosis not present

## 2018-10-17 DIAGNOSIS — L97526 Non-pressure chronic ulcer of other part of left foot with bone involvement without evidence of necrosis: Secondary | ICD-10-CM | POA: Diagnosis not present

## 2018-10-17 DIAGNOSIS — I132 Hypertensive heart and chronic kidney disease with heart failure and with stage 5 chronic kidney disease, or end stage renal disease: Secondary | ICD-10-CM | POA: Diagnosis not present

## 2018-10-17 DIAGNOSIS — Z4800 Encounter for change or removal of nonsurgical wound dressing: Secondary | ICD-10-CM | POA: Diagnosis not present

## 2018-10-17 DIAGNOSIS — L97423 Non-pressure chronic ulcer of left heel and midfoot with necrosis of muscle: Secondary | ICD-10-CM | POA: Diagnosis not present

## 2018-10-17 DIAGNOSIS — I87332 Chronic venous hypertension (idiopathic) with ulcer and inflammation of left lower extremity: Secondary | ICD-10-CM | POA: Diagnosis not present

## 2018-10-17 DIAGNOSIS — I96 Gangrene, not elsewhere classified: Secondary | ICD-10-CM | POA: Diagnosis not present

## 2018-10-17 DIAGNOSIS — E11621 Type 2 diabetes mellitus with foot ulcer: Secondary | ICD-10-CM | POA: Diagnosis not present

## 2018-10-17 DIAGNOSIS — E1122 Type 2 diabetes mellitus with diabetic chronic kidney disease: Secondary | ICD-10-CM | POA: Diagnosis not present

## 2018-10-17 NOTE — Progress Notes (Signed)
CHAPMAN, MATTEUCCI ([id number]) Visit Report for 10/09/2018 Arrival Information Details Patient Name: Date of Service: KAMAURY, CUTBIRTH 10/09/2018 3:00 PM Medical Record [id number] Patient Account Number: [id number] Date of Birth/Sex: Treating RN: [birth year] (58 y.o. [name] Primary Care Manus Weedman: Geryl Rankins Other Clinician: Referring Jaylan Duggar: Treating Nobel Brar/Extender:Robson, Rodrigo Ran, Paulette Blanch in Treatment: 14 Visit Information History Since Last Visit Added or deleted any medications: No Patient Arrived: Wheel Chair Any new allergies or adverse reactions: No Arrival Time: 16:17 Had a fall or experienced change in No Accompanied By: friend activities of daily living that may affect Transfer Assistance: EasyPivot risk of falls: Patient Lift Signs or symptoms of abuse/neglect since last No Patient Identification Verified: Yes visito Secondary Verification Process Yes Hospitalized since last visit: No Completed: Implantable device outside of the clinic excluding No Patient Requires Transmission-Based No cellular tissue based products placed in the center Precautions: since last visit: Patient Has Alerts: No Has Dressing in Place as Prescribed: Yes Pain Present Now: No Electronic Signature(s) Signed: 10/17/2018 5:13:27 PM By: Kela Millin Entered By: Kela Millin on 10/09/2018 16:18:02 -------------------------------------------------------------------------------- Complex / Palliative Patient Assessment Details Patient Name: Date of Service: LOKI, WUTHRICH 10/09/2018 3:00 PM Medical Record [id number] Patient Account Number: [id number] Date of Birth/Sex: Treating RN: [birth year] (58 y.o. [name] Primary Care Sheray Grist: Geryl Rankins Other Clinician: Referring Basilia Stuckert: Treating Otelia Hettinger/Extender:Robson, Rodrigo Ran, Paulette Blanch in Treatment: 14 Palliative Management Criteria Complex Wound Management  Criteria Patient has remarkable or complex co-morbidities requiring medications or treatments that extend wound healing times. Examples: Diabetes mellitus with chronic renal failure or end stage renal disease requiring dialysis Advanced or poorly controlled rheumatoid arthritis Diabetes mellitus and end stage chronic obstructive pulmonary disease Active cancer with current chemo- or radiation therapy Type 2 Diabetes, ESRD on dialysis, PAD Care Approach Wound Care Plan: Complex Wound Management Electronic Signature(s) Signed: 10/10/2018 6:18:27 PM By: Linton Ham MD Signed: 10/13/2018 6:17:12 PM By: Levan Hurst RN, BSN Entered By: Levan Hurst on 10/10/2018 08:41:39 -------------------------------------------------------------------------------- Encounter Discharge Information Details Patient Name: Date of Service: AXXEL, GUDE 10/09/2018 3:00 PM Medical Record [id number] Patient Account Number: [id number] Date of Birth/Sex: Treating RN: 02-27-1960 (58 y.o. [name] Primary Care Shaniquia Brafford: Geryl Rankins Other Clinician: Referring Shivali Quackenbush: Treating Ellagrace Yoshida/Extender:Robson, Rodrigo Ran, Paulette Blanch in Treatment: 14 Encounter Discharge Information Items Post Procedure Vitals Discharge Condition: Stable Temperature (F): 98.4 Ambulatory Status: Wheelchair Pulse (bpm): 104 Discharge Destination: Home Respiratory Rate (breaths/min): 19 Transportation: Other Blood Pressure (mmHg): 126/66 Accompanied By: caregiver Schedule Follow-up Appointment: Yes Clinical Summary of Care: Patient Declined Electronic Signature(s) Signed: 10/13/2018 6:17:12 PM By: Levan Hurst RN, BSN Entered By: Levan Hurst on 10/09/2018 17:50:43 -------------------------------------------------------------------------------- Lower Extremity Assessment Details Patient Name: Date of Service: [name] 10/09/2018 3:00 PM Medical Record [id number] Patient Account  Number: [id number] Date of Birth/Sex: Treating RN: 11/10/1960 (58 y.o. [name] Primary Care Cynda Soule: Geryl Rankins Other Clinician: Referring Antwan Pandya: Treating Trevion Hoben/Extender:Robson, Rodrigo Ran, [name] in Treatment: 14 Edema Assessment Assessed: [Left: No] [Right: No] Edema: [Left: Ye] [Right: s] Calf Left: Right: Point of Measurement: 40 cm From Medial Instep 39.5 cm cm Ankle Left: Right: Point of Measurement: 10 cm From Medial Instep 28 cm cm Vascular Assessment Pulses: Dorsalis Pedis Palpable: [Left:Yes] Electronic Signature(s) Signed: 10/17/2018 5:13:27 PM By: Kela Millin Entered By: Kela Millin on 10/09/2018 16:26:16 -------------------------------------------------------------------------------- Multi Wound Chart Details Patient Name: Date of Service: [name] 10/09/2018 3:00 PM Medical Record [id number] Patient Account Number: [id number] Date of Birth/Sex:  Treating RN: [birth year] (58 y.o. M) Primary Care Dawson Albers: Geryl Rankins Other Clinician: Referring Ariya Bohannon: Treating Crandall Harvel/Extender:Robson, Rodrigo Ran, [name] in Treatment: 14 Vital Signs Height(in): 1 Pulse(bpm): 104 Weight(lbs): 170 Blood Pressure(mmHg): 126/66 Body Mass Index(BMI): 25 Temperature(F): 98.4 Respiratory 19 Rate(breaths/min): Photos: [1:No Photos] [2:No Photos] [3:No Photos] Wound Location: [1:Left Calcaneus - Lateral] [2:Left Toe Second] [3:Left Lower Leg - Posterior] Wounding Event: [1:Gradually Appeared] [2:Pressure Injury] [3:Gradually Appeared] Primary Etiology: [1:Diabetic Wound/Ulcer of the Diabetic Wound/Ulcer of the Diabetic Wound/Ulcer of the Lower Extremity] [2:Lower Extremity] [3:Lower Extremity] Comorbid History: [1:Chronic Obstructive Pulmonary Disease (COPD), Congestive Heart (COPD), Congestive Heart (COPD), Congestive Heart Failure, Hypertension, Type Failure, Hypertension, Type Failure, Hypertension, Type  II Diabetes, End Stage Renal  Disease] [2:Chronic Obstructive Pulmonary Disease II Diabetes, End Stage Renal Disease] [3:Chronic Obstructive Pulmonary Disease II Diabetes, End Stage Renal Disease] Date Acquired: [1:06/02/2018] [2:07/17/2018] [3:07/29/2018] Weeks of Treatment: [1:14] [2:12] [3:10] Wound Status: [1:Open] [2:Open] [3:Open] Measurements L x W x D 6.9x14x1.3 [2:2.5x6x0.2] [3:0x0x0] (cm) Area (cm) : [1:75.869] [2:11.781] [3:0] Volume (cm) : [1:98.63] [2:2.356] [3:0] % Reduction in Area: [1:-141.50%] [2:-1289.30%] [3:100.00%] % Reduction in Volume: -946.50% [2:-2671.80%] [3:100.00%] Starting Position 1 [1:10] (o'clock): Ending Position 1 [1:1] (o'clock): Maximum Distance 1 [1:1.6] (cm): Undermining: [1:Yes] [2:No] [3:No] Classification: [1:Grade 4] [2:Grade 4] [3:Grade 2] Exudate Amount: [1:Medium] [2:Small] [3:None Present] Exudate Type: [1:Purulent] [2:Purulent] [3:N/A] Exudate Color: [1:yellow, brown, green] [2:yellow, brown, green] [3:N/A] Foul Odor After Cleansing:Yes [2:No] [3:No] Odor Anticipated Due to No [2:N/A] [3:N/A] Product Use: Wound Margin: [1:Well defined, not attached] [2:Distinct, outline attached] [3:Flat and Intact] Granulation Amount: [1:Medium (34-66%)] [2:None Present (0%)] [3:None Present (0%)] Granulation Quality: [1:Red] [2:N/A] [3:N/A] Necrotic Amount: [1:Medium (34-66%)] [2:Large (67-100%)] [3:None Present (0%)] Necrotic Tissue: [1:Eschar, Adherent Slough] [2:Eschar] [3:N/A] Exposed Structures: [1:Fat Layer (Subcutaneous Tissue) Exposed: Yes Tendon: Yes Muscle: Yes Fascia: No Joint: No Bone: No] [2:Fat Layer (Subcutaneous Tissue) Exposed: Yes Fascia: No Tendon: No Muscle: No Joint: No Bone: No] [3:Fascia: No Fat Layer (Subcutaneous Tissue)  Exposed: No Tendon: No Muscle: No Joint: No Bone: No] Epithelialization: [1:None] [2:None] [3:Large (67-100%)] Debridement: [1:Debridement - Excisional] [2:N/A] [3:N/A] Pre-procedure [1:14:50] [2:N/A]  [3:N/A] Verification/Time Out Taken: Pain Control: [1:Lidocaine 4% Topical Solution] [2:N/A] [3:N/A] Tissue Debrided: [1:Necrotic/Eschar, Tendon, Slough] [2:N/A] [3:N/A] Level: [1:Skin/Subcutaneous Tissue/Muscle] [2:N/A] [3:N/A] Debridement Area (sq cm):20 [2:N/A] [3:N/A] Instrument: [1:Blade, Forceps] [2:N/A] [3:N/A] Bleeding: [1:Moderate] [2:N/A] [3:N/A] Hemostasis Achieved: [1:Pressure] [2:N/A] [3:N/A] Procedural Pain: [1:0] [2:N/A] [3:N/A] Post Procedural Pain: [1:0] [2:N/A] [3:N/A] Debridement Treatment Procedure was tolerated [2:N/A] [3:N/A] Response: [1:well] Post Debridement [1:6.9x14x1.3] [2:N/A] [3:N/A] Measurements L x W x D (cm) Post Debridement [1:98.63] [2:N/A] [3:N/A] Volume: (cm) Procedures Performed: Debridement [2:N/A] [3:N/A] Electronic Signature(s) Signed: 10/09/2018 6:13:48 PM By: Linton Ham MD Entered By: Linton Ham on 10/09/2018 17:44:30 -------------------------------------------------------------------------------- Multi-Disciplinary Care Plan Details Patient Name: Date of Service: [name] 10/09/2018 3:00 PM Medical Record [id number] Patient Account Number: [id number] Date of Birth/Sex: Treating RN: 1960-02-11 (58 y.o. [name] Primary Care Israel Wunder: Geryl Rankins Other Clinician: Referring Rafferty Postlewait: Treating Calena Salem/Extender:Robson, Rodrigo Ran, Paulette Blanch in Treatment: 14 Active Inactive Tissue Oxygenation Nursing Diagnoses: Actual ineffective tissue perfusion; peripheral (select once diagnosis is confirmed) Goals: Patient/caregiver will verbalize understanding of disease process and disease management Date Initiated: 10/09/2018 Target Resolution Date: 11/14/2018 Goal Status: Active Interventions: Assess patient understanding of disease process and management upon diagnosis and as needed Provide education on tissue oxygenation and ischemia Treatment Activities: Ankle Brachial Index (ABI) : 10/03/2018 Test  ordered outside of clinic : 10/09/2018 Notes: Wound/Skin  Impairment Nursing Diagnoses: Impaired tissue integrity Knowledge deficit related to ulceration/compromised skin integrity Goals: Patient/caregiver will verbalize understanding of skin care regimen Date Initiated: 06/30/2018 Date Inactivated: 08/05/2018 Target Resolution Date: 08/01/2018 Goal Status: Met Ulcer/skin breakdown will have a volume reduction of 30% by week 4 Date Initiated: 06/30/2018 Date Inactivated: 08/05/2018 Target Resolution Date: 08/01/2018 Goal Status: Unmet Unmet Reason: comorbities Ulcer/skin breakdown will have a volume reduction of 50% by week 8 Date Initiated: 08/05/2018 Date Inactivated: 09/09/2018 Target Resolution Date: 09/05/2018 Goal Status: Unmet Unmet Reason: comorbities Ulcer/skin breakdown will have a volume reduction of 80% by week 12 Date Initiated: 09/09/2018 Target Resolution Date: 10/03/2018 Goal Status: Active Interventions: Assess patient/caregiver ability to obtain necessary supplies Assess patient/caregiver ability to perform ulcer/skin care regimen upon admission and as needed Assess ulceration(s) every visit Provide education on ulcer and skin care Notes: Electronic Signature(s) Signed: 10/09/2018 6:31:45 PM By: Deon Pilling Entered By: Deon Pilling on 10/09/2018 16:55:22 -------------------------------------------------------------------------------- Pain Assessment Details Patient Name: Date of Service: CHARVIS, LIGHTNER 10/09/2018 3:00 PM Medical Record [id number] Patient Account Number: [id number] Date of Birth/Sex: Treating RN: 1960/06/29 (58 y.o. [name] Primary Care Janet Humphreys: Geryl Rankins Other Clinician: Referring Lorenzo Arscott: Treating Benita Boonstra/Extender:Robson, Rodrigo Ran, Paulette Blanch in Treatment: 14 Active Problems Location of Pain Severity and Description of Pain Patient Has Paino No Site Locations Pain Management and Medication Current Pain  Management: Electronic Signature(s) Signed: 10/17/2018 5:13:27 PM By: Kela Millin Entered By: Kela Millin on 10/09/2018 16:19:03 -------------------------------------------------------------------------------- Patient/Caregiver Education Details Patient Name: Date of Service: [name] 10/1/2020andnbsp3:00 PM Medical Record [id number] Patient Account Number: [id number] Date of Birth/Gender: Treating RN: November 23, 1960 (58 y.o. [name] Primary Care Physician: Geryl Rankins Other Clinician: Referring Physician: Treating Physician/Extender:Robson, Rodrigo Ran, Paulette Blanch in Treatment: 14 Education Assessment Education Provided To: Patient Education Topics Provided Tissue Oxygenation: Handouts: Peripheral Arterial Disease and Related Ulcers Methods: Explain/Verbal Responses: Reinforcements needed Electronic Signature(s) Signed: 10/09/2018 6:31:45 PM By: Deon Pilling Entered By: Deon Pilling on 10/09/2018 16:55:37 -------------------------------------------------------------------------------- Wound Assessment Details Patient Name: Date of Service: DELRICK, DEHART 10/09/2018 3:00 PM Medical Record [id number] Patient Account Number: [id number] Date of Birth/Sex: Treating RN: [birth year] (58 y.o. [name] Primary Care Eythan Jayne: Geryl Rankins Other Clinician: Referring Valeen Borys: Treating Lula Kolton/Extender:Robson, Rodrigo Ran, [name] in Treatment: 14 Wound Status Wound Number: 1 Primary Diabetic Wound/Ulcer of the Lower Extremity Etiology: Wound Location: Left Calcaneus - Lateral Wound Open Wounding Event: Gradually Appeared Status: Date Acquired: 06/02/2018 Comorbid Chronic Obstructive Pulmonary Disease Weeks Of Treatment: 14 History: (COPD), Congestive Heart Failure, Clustered Wound: No Clustered Wound: No Hypertension, Type II Diabetes, End Stage Renal Disease Photos Wound Measurements Length: (cm)  6.9 Width: (cm) 14 Depth: (cm) 1.3 Area: (cm) 75.869 Volume: (cm) 98.63 % Reduction in Area: -141.5% % Reduction in Volume: -946.5% Epithelialization: None Tunneling: No Undermining: Yes Starting Position (o'clock): 10 Ending Position (o'clock): 1 Maximum Distance: (cm) 1.6 Wound Description Classification: Grade 4 Wound Margin: Well defined, not attached Exudate Amount: Medium Exudate Type: Purulent Exudate Color: yellow, brown, green Wound Bed Granulation Amount: Medium (34-66%) Granulation Quality: Red Necrotic Amount: Medium (34-66%) Necrotic Quality: Eschar, Adherent Slough Foul Odor After Cleansing: Yes Due to Product Use: No Slough/Fibrino Yes Exposed Structure Fascia Exposed: No Fat Layer (Subcutaneous Tissue) Exposed: Yes Tendon Exposed: Yes Muscle Exposed: Yes Necrosis of Muscle: Yes Joint Exposed: No Bone Exposed: No Electronic Signature(s) Signed: 10/10/2018 4:13:18 PM By: Mikeal Hawthorne EMT/HBOT Signed: 10/17/2018 5:13:27 PM By: Kela Millin Entered By: Mikeal Hawthorne on 10/10/2018 10:48:38 --------------------------------------------------------------------------------  Wound Assessment Details Patient Name: Date of Service: PHILIP, KOTLYAR 10/09/2018 3:00 PM Medical Record [id number] Patient Account Number: [id number] Date of Birth/Sex: Treating RN: 1960/12/11 (58 y.o. [name] Primary Care Gayatri Teasdale: Other Clinician: Geryl Rankins Referring Meko Masterson: Treating Idelle Reimann/Extender:Robson, Rodrigo Ran, [name] in Treatment: 14 Wound Status Wound Number: 2 Primary Diabetic Wound/Ulcer of the Lower Extremity Etiology: Wound Location: Left Toe Second Wound Open Wounding Event: Pressure Injury Status: Date Acquired: 07/17/2018 Comorbid Chronic Obstructive Pulmonary Disease Weeks Of Treatment: 12 History: (COPD), Congestive Heart Failure, Clustered Wound: No Hypertension, Type II Diabetes, End Stage Renal  Disease Photos Wound Measurements Length: (cm) 2.5 % Reductio Width: (cm) 6 % Reductio Depth: (cm) 0.2 Epithelial Area: (cm) 11.781 Tunneling Volume: (cm) 2.Luquillo Description Classification: Grade 4 Wound Margin: Distinct, outline attached Exudate Amount: Small Exudate Type: Purulent Exudate Color: yellow, brown, green Wound Bed Granulation Amount: None Present (0%) Necrotic Amount: Large (67-100%) Necrotic Quality: Eschar Foul Odor After Cleansing: No Slough/Fibrino Yes Exposed Structure Fascia Exposed: No Fat Layer (Subcutaneous Tissue) Exposed: Yes Tendon Exposed: No Muscle Exposed: No Joint Exposed: No Bone Exposed: No n in Area: -1289.3% n in Volume: -2671.8% ization: None : No ng: No Electronic Signature(s) Signed: 10/10/2018 4:13:18 PM By: Mikeal Hawthorne EMT/HBOT Signed: 10/17/2018 5:13:27 PM By: Kela Millin Entered By: Mikeal Hawthorne on 10/10/2018 10:49:00 -------------------------------------------------------------------------------- Wound Assessment Details Patient Name: Date of Service: [name] 10/09/2018 3:00 PM Medical Record [id number] Patient Account Number: [id number] Date of Birth/Sex: Treating RN: [birth year] (58 y.o. [name] Primary Care Raveen Wieseler: Geryl Rankins Other Clinician: Referring Gabbie Marzo: Treating Mionna Advincula/Extender:Robson, Rodrigo Ran, [name] in Treatment: 14 Wound Status Wound Number: 3 Primary Diabetic Wound/Ulcer of the Lower Extremity Etiology: Wound Location: Left Lower Leg - Posterior Wound Healed - Epithelialized Wounding Event: Gradually Appeared Status: Date Acquired: 07/29/2018 Comorbid Chronic Obstructive Pulmonary Disease Weeks Of Treatment: 10 History: (COPD), Congestive Heart Failure, Clustered Wound: No Hypertension, Type II Diabetes, End Stage Renal Disease Photos Wound Measurements Length: (cm) 0 % Reduction Width: (cm) 0 % Reduction Depth: (cm) 0  Epithelializ Area: (cm) 0 Tunneling: Volume: (cm) 0 Undermining Wound Description Classification: Grade 2 Foul Odor Af Wound Margin: Flat and Intact Slough/Fibri Exudate Amount: None Present Wound Bed Granulation Amount: None Present (0%) Necrotic Amount: None Present (0%) Fascia Expos Fat Layer (S Tendon Expos Muscle Expos Joint Expose Bone Exposed ter Cleansing: No no No Exposed Structure ed: No ubcutaneous Tissue) Exposed: No ed: No ed: No d: No : No in Area: 100% in Volume: 100% ation: Large (67-100%) No : No Electronic Signature(s) Signed: 10/10/2018 4:13:18 PM By: Mikeal Hawthorne EMT/HBOT Signed: 10/17/2018 5:13:27 PM By: Kela Millin Entered By: Mikeal Hawthorne on 10/10/2018 10:48:00 -------------------------------------------------------------------------------- Vitals Details Patient Name: Date of Service: [name] 10/09/2018 3:00 PM Medical Record [id number] Patient Account Number: [id number] Date of Birth/Sex: Treating RN: 1960-12-05 (58 y.o. [name] Primary Care Manna Gose: Geryl Rankins Other Clinician: Referring Kaysan Peixoto: Treating Audris Speaker/Extender:Robson, Rodrigo Ran, [name] in Treatment: 14 Vital Signs Time Taken: 16:15 Temperature (F): 98.4 Height (in): 69 Pulse (bpm): 104 Weight (lbs): 170 Respiratory Rate (breaths/min): 19 Body Mass Index (BMI): 25.1 Blood Pressure (mmHg): 126/66 Reference Range: 80 - 120 mg / dl Electronic Signature(s) Signed: 10/17/2018 5:13:27 PM By: Kela Millin Entered By: Kela Millin on 10/09/2018 16:18:58

## 2018-10-18 DIAGNOSIS — Z992 Dependence on renal dialysis: Secondary | ICD-10-CM | POA: Diagnosis not present

## 2018-10-18 DIAGNOSIS — N2581 Secondary hyperparathyroidism of renal origin: Secondary | ICD-10-CM | POA: Diagnosis not present

## 2018-10-18 DIAGNOSIS — E119 Type 2 diabetes mellitus without complications: Secondary | ICD-10-CM | POA: Diagnosis not present

## 2018-10-18 DIAGNOSIS — N186 End stage renal disease: Secondary | ICD-10-CM | POA: Diagnosis not present

## 2018-10-18 DIAGNOSIS — D509 Iron deficiency anemia, unspecified: Secondary | ICD-10-CM | POA: Diagnosis not present

## 2018-10-21 DIAGNOSIS — N186 End stage renal disease: Secondary | ICD-10-CM | POA: Diagnosis not present

## 2018-10-21 DIAGNOSIS — N2581 Secondary hyperparathyroidism of renal origin: Secondary | ICD-10-CM | POA: Diagnosis not present

## 2018-10-21 DIAGNOSIS — E119 Type 2 diabetes mellitus without complications: Secondary | ICD-10-CM | POA: Diagnosis not present

## 2018-10-21 DIAGNOSIS — D509 Iron deficiency anemia, unspecified: Secondary | ICD-10-CM | POA: Diagnosis not present

## 2018-10-21 DIAGNOSIS — Z992 Dependence on renal dialysis: Secondary | ICD-10-CM | POA: Diagnosis not present

## 2018-10-21 NOTE — Progress Notes (Signed)
Nathaniel White ([id number]) Visit Report for 10/17/2018 Arrival Information Details Patient Name: Date of Service: Nathaniel White, Nathaniel White 10/17/2018 2:30 PM Medical Record [id number] Patient Account Number: [id number] Date of Birth/Sex: Treating RN: Aug 09, 1960 (58 y.o. Nathaniel White Primary Care Ariane Ditullio: Geryl Rankins Other Clinician: Referring Berklee Battey: Treating Ludy Messamore/Extender:Robson, Rodrigo Ran, Paulette Blanch in Treatment: 15 Visit Information History Since Last Visit Added or deleted any medications: No Patient Arrived: Wheel Chair Any new allergies or adverse reactions: No Arrival Time: 15:05 Had a fall or experienced change in No activities of daily living that may affect Accompanied By: self risk of falls: Transfer Assistance: None Signs or symptoms of abuse/neglect since last No Patient Identification Verified: Yes visito Secondary Verification Process Completed: Yes Hospitalized since last visit: No Patient Requires Transmission-Based No Implantable device outside of the clinic excluding No Precautions: cellular tissue based products placed in the center Patient Has Alerts: No since last visit: Has Dressing in Place as Prescribed: Yes Pain Present Now: No Electronic Signature(s) Signed: 10/17/2018 5:13:27 PM By: Kela Millin Entered By: Kela Millin on 10/17/2018 15:06:00 -------------------------------------------------------------------------------- Encounter Discharge Information Details Patient Name: Date of Service: Nathaniel White 10/17/2018 2:30 PM Medical Record [id number] Patient Account Number: [id number] Date of Birth/Sex: Treating RN: [birth year] (58 y.o. Nathaniel White Primary Care Edison Nicholson: Geryl Rankins Other Clinician: Referring Shauntel Prest: Treating Jacody Beneke/Extender:Robson, Rodrigo Ran, Paulette Blanch in Treatment: 15 Encounter Discharge Information Items Post Procedure Vitals Discharge Condition:  Stable Temperature (F): 98.4 Ambulatory Status: Wheelchair Pulse (bpm): 90 Discharge Destination: Home Respiratory Rate (breaths/min): 18 Transportation: Private Auto Blood Pressure (mmHg): 118/99 Accompanied By: self Schedule Follow-up Appointment: Yes Clinical Summary of Care: Electronic Signature(s) Signed: 10/17/2018 6:01:54 PM By: Deon Pilling Entered By: Deon Pilling on 10/17/2018 16:16:37 -------------------------------------------------------------------------------- Lower Extremity Assessment Details Patient Name: Date of Service: Nathaniel White, Nathaniel White 10/17/2018 2:30 PM Medical Record [id number] Patient Account Number: [id number] Date of Birth/Sex: Treating RN: [birth year] (58 y.o. Nathaniel White Primary Care [name]: Geryl Rankins Other Clinician: Referring Tori Cupps: Treating Zigmond Trela/Extender:Robson, Rodrigo Ran, [name] in Treatment: 15 Edema Assessment Assessed: [Left: No] [Right: No] Edema: [Left: Ye] [Right: s] Calf Left: Right: Point of Measurement: 40 cm From Medial Instep 40 cm cm Ankle Left: Right: Point of Measurement: 10 cm From Medial Instep 27 cm cm Electronic Signature(s) Signed: 10/17/2018 5:13:27 PM By: Kela Millin Entered By: Kela Millin on 10/17/2018 15:09:54 -------------------------------------------------------------------------------- Multi Wound Chart Details Patient Name: Date of Service: Nathaniel White 10/17/2018 2:30 PM Medical Record [id number] Patient Account Number: [id number] Date of Birth/Sex: Treating RN: 1960/05/21 (58 y.o. 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] [2:Lower Extremity] Comorbid History: [1:Chronic Obstructive Pulmonary Disease (COPD), Congestive Heart (COPD), Congestive Heart Failure, Hypertension, Type Failure, Hypertension, Type II Diabetes, End Stage Renal Disease] [2:Chronic Obstructive Pulmonary Disease II  Diabetes, End Stage Renal Disease] [N/A:N/A] Date Acquired: [1:06/02/2018] [2:07/17/2018] [N/A:N/A] Weeks of Treatment: [1:15] [2:13] [N/A:N/A] Wound Status: [1:Open] [2:Open] [N/A:N/A] Measurements L x W x D 7.2x14.2x0.5 [2:2.5x6x0.4] [N/A:N/A] (cm) Area (cm) : [1:80.299] [2:11.781] [N/A:N/A] Volume (cm) : [1:40.15] [2:4.712] [N/A:N/A] % Reduction in Area: [1:-155.60%] [2:-1289.30%] [N/A:N/A] %  Reduction in Volume: -326.00% [2:-5443.50%] [N/A:N/A] Classification: [1:Grade 4] [2:Grade 4] [N/A:N/A] Exudate Amount: [1:Medium] [2:Medium] [N/A:N/A] Exudate Type: [1:Purulent] [2:Purulent] [N/A:N/A] Exudate Color: [1:yellow, brown, green] [2:yellow, brown, green] [N/A:N/A] Foul Odor After Cleansing:Yes [2:Yes] [N/A:N/A] Odor Anticipated Due to No [2:No] [N/A:N/A] Product Use: Wound Margin: [1:Well defined, not attached Well defined, not attached N/A] Granulation Amount: [1:Medium (34-66%)] [2:None Present (0%)] [N/A:N/A] Granulation Quality: [1:Red] [2:N/A] [N/A:N/A] Necrotic Amount: [1:Medium (34-66%)] [2:Large (67-100%)] [N/A:N/A] Necrotic Tissue: [1:Eschar, Adherent [place] N/A] Exposed Structures: [1:Fat Layer (Subcutaneous Fascia: No Tissue) Exposed: Yes Tendon: Yes Muscle: Yes Fascia: No Joint: No Bone: No] [2:Fat Layer (Subcutaneous Tissue) Exposed: No Tendon: No Muscle: No Joint: No Bone: No] [N/A:N/A] Epithelialization: [1:None]  [2:None] [N/A:N/A] Debridement: [1:Debridement - Excisional N/A] [N/A:N/A] Pre-procedure [1:15:28] [2:N/A] [N/A:N/A] Verification/Time Out Taken: Tissue Debrided: [1:Necrotic/Eschar, Subcutaneous] [2:N/A] [N/A:N/A] Level: [1:Skin/Subcutaneous Tissue N/A] [N/A:N/A] Debridement Area (sq cm):16 [2:N/A] [N/A:N/A] Instrument: [1:Curette] [2:N/A] [N/A:N/A] Bleeding: [1:Moderate] [2:N/A] [N/A:N/A] Hemostasis Achieved: [1:Pressure] [2:N/A] [N/A:N/A] Procedural Pain: [1:0] [2:N/A] [N/A:N/A] Post Procedural Pain: [1:0] [2:N/A] [N/A:N/A] Debridement Treatment [1:Procedure was tolerated] [2:N/A] [N/A:N/A] Response: [1:well] Post Debridement [1:7.2x14.2x0.5] [2:N/A] [N/A:N/A] Measurements L x W x D (cm) Post Debridement [1:40.15] [2:N/A] [N/A:N/A] Volume: (cm) Assessment Notes: [1:N/A Debridement] [2:scant bleeding N/A] [N/A:N/A N/A] Treatment Notes Wound #1 (Left, Lateral Calcaneus) 1. Cleanse With Wound Cleanser 3. Primary Dressing Applied Calcium Alginate Ag 4. Secondary Dressing ABD Pad Dry Gauze Roll Gauze 5. Secured With Tape Notes netting. Wound #2 (Left Toe Second) 1. Cleanse With Wound Cleanser 3. Primary Dressing Applied Calcium Alginate Ag 4. Secondary Dressing Dry Gauze Roll Gauze 5. Secured With [employer]) Signed: 10/19/2018 10:27:00 AM By: Linton Ham MD Signed: 10/21/2018 5:52:09 PM By: Levan Hurst RN, BSN Entered By: Linton Ham on 10/19/2018 10:13:14 -------------------------------------------------------------------------------- Multi-Disciplinary Care Plan Details Patient Name: Date of Service: KANEN, MOTTOLA 10/17/2018 2:30 PM Medical Record [phone] Patient Account Number: [id number] Date of Birth/Sex: Treating RN: Dec 15, 1960 (58 y.o. [name] Primary Care Lenard Kampf: Geryl Rankins Other Clinician: Referring Rosemae Mcquown: Treating Jwan Hornbaker/Extender:Robson, Rodrigo Ran, Paulette Blanch in  Treatment: 15 Active Inactive Tissue Oxygenation Nursing Diagnoses: Actual ineffective tissue perfusion; peripheral (select once diagnosis is confirmed) Goals: Patient/caregiver will verbalize understanding of disease process and disease management Date Initiated: 10/09/2018 Target Resolution Date: 11/14/2018 Goal Status: Active Interventions: Assess patient understanding of disease process and management upon diagnosis and as needed Provide education on tissue oxygenation and ischemia Treatment Activities: Ankle Brachial Index (ABI) : 10/03/2018 Test ordered outside of clinic : 10/09/2018 Notes: Electronic Signature(s) Signed: 10/21/2018 5:52:09 PM By: Levan Hurst RN, BSN Entered By: Levan Hurst on 10/17/2018 16:24:37 -------------------------------------------------------------------------------- Pain Assessment Details Patient Name: Date of Service: Nathaniel White, Nathaniel White 10/17/2018 2:30 PM Medical Record [id number] Patient Account Number: [id number] Date of Birth/Sex: Treating RN: 07-21-1960 (58 y.o. Nathaniel White Primary Care Monnie Gudgel: Geryl Rankins Other Clinician: Referring Meriah Shands: Treating Cace Osorto/Extender:Robson, Rodrigo Ran, Paulette Blanch in Treatment: 15 Active Problems Location of Pain Severity and Description of Pain Patient Has Paino No Site Locations Pain Management and Medication Current Pain Management: Electronic Signature(s) Signed: 10/17/2018 5:13:27 PM By: Kela Millin Entered By: Kela Millin on 10/17/2018 15:09:13 -------------------------------------------------------------------------------- Patient/Caregiver Education Details Patient Name: Date of Service: Nathaniel White 10/9/2020andnbsp2:30 PM Medical Record [phone] Patient Account Number: [id number] Date of Birth/Gender: Treating RN: September 26, 1960 (58 y.o. [name] Primary Care Physician: Geryl Rankins Other Clinician: Referring Physician:  Treating Physician/Extender:Robson, Rodrigo Ran, Paulette Blanch in Treatment: 15 Education Assessment Education Provided To: Patient Education Topics  Provided Tissue Oxygenation: Methods: Explain/Verbal Responses: State content correctly Wound/Skin Impairment: Methods: Explain/Verbal Responses: State content correctly Electronic Signature(s) Signed: 10/21/2018 5:52:09 PM By: Levan Hurst RN, BSN Entered By: Levan Hurst on 10/17/2018 15:23:31 -------------------------------------------------------------------------------- Wound Assessment Details Patient Name: Date of Service: Nathaniel White 10/17/2018 2:30 PM Medical Record [id number] Patient Account Number: [id number] Date of Birth/Sex: Treating RN: [birth year] (58 y.o. Nathaniel White Primary Care Marlicia Sroka: Geryl Rankins Other Clinician: Referring Sovereign Ramiro: Treating Verena Shawgo/Extender:Robson, Rodrigo Ran, [name] in Treatment: 15 Wound Status Wound Number: 1 Primary Diabetic Wound/Ulcer of the Lower Extremity Etiology: Wound Location: Left Calcaneus - Lateral Wound Open Wounding Event: Gradually Appeared Status: Date Acquired: 06/02/2018 Comorbid Chronic Obstructive Pulmonary Disease Weeks Of Treatment: 15 History: (COPD), Congestive Heart Failure, Clustered Wound: No Hypertension, Type II Diabetes, End Stage Renal Disease Photos Wound Measurements Length: (cm) 7.2 % Reduction in Width: (cm) 14.2 % Reduction in Depth: (cm) 0.5 Epithelializat Area: (cm) 80.299 Tunneling: Volume: (cm) 40.15 Undermining: Wound Description Classification: Grade 4 Foul Odor Afte Wound Margin: Well defined, not attached Due to Product Exudate Amount: Medium Slough/Fibrino Exudate Type: Purulent Exudate Color: yellow, brown, green Wound Bed Granulation Amount: Medium (34-66%) Granulation Quality: Red Fascia Exposed Necrotic Amount: Medium (34-66%) Fat Layer (Sub Necrotic Quality: Eschar, Adherent  Slough Tendon Exposed Muscle Exposed Necrosis Joint Exposed: Bone Exposed: Treatment Notes Wound #1 (Left, Lateral Calcaneus) 1. Cleanse With Wound Cleanser 3. Primary Dressing Applied Calcium Alginate Ag 4. Secondary Dressing ABD Pad Dry Gauze Roll Gauze 5. Secured With Tape r Cleansing: Yes Use: No Yes Exposed Structure : No cutaneous Tissue) Exposed: Yes : Yes : Yes of Muscle: Yes No No Area: -155.6% Volume: -326% ion: None No No Notes netting. Electronic Signature(s) Signed: 10/20/2018 3:43:33 PM By: Mikeal Hawthorne EMT/HBOT Signed: 10/20/2018 5:18:03 PM By: Kela Millin Previous Signature: 10/17/2018 5:13:27 PM Version By: Kela Millin Entered By: Mikeal Hawthorne on 10/20/2018 09:26:38 -------------------------------------------------------------------------------- Wound Assessment Details Patient Name: Date of Service: Nathaniel White, Nathaniel White 10/17/2018 2:30 PM Medical Record [id number] Patient Account Number: [id number] Date of Birth/Sex: Treating RN: [birth year] (58 y.o. Nathaniel White Primary Care Shakora Nordquist: Geryl Rankins Other Clinician: Referring Thaily Hackworth: Treating Kathleene Bergemann/Extender:Robson, Rodrigo Ran, [name] in Treatment: 15 Wound Status Wound Number: 2 Primary Diabetic Wound/Ulcer of the Lower Extremity Etiology: Wound Location: Left Toe Second Wound Open Wounding Event: Pressure Injury Status: Date Acquired: 07/17/2018 Comorbid Chronic Obstructive Pulmonary Disease Weeks Of Treatment: 13 History: (COPD), Congestive Heart Failure, Clustered Wound: No Hypertension, Type II Diabetes, End Stage Renal Disease Photos Wound Measurements Length: (cm) 2.5 Width: (cm) 6 Depth: (cm) 0.4 Area: (cm) 11.781 Volume: (cm) 4.712 Wound Description Classification: Grade 4 Wound Margin: Well defined, not attached Exudate Amount: Medium Exudate Type: Purulent Exudate Color: yellow, brown, green Wound Bed Granulation Amount:  None Present (0%) Necrotic Amount: Large (67-100%) Necrotic Quality: Eschar, Adherent Slough After Cleansing: Yes duct Use: No rino Yes Exposed Structure osed: No (Subcutaneous Tissue) Exposed: No osed: No osed: No sed: No ed: No % Reduction in Area: -1289.3% % Reduction in Volume: -5443.5% Epithelialization: None Tunneling: No Undermining: No Foul Odor Due to Pro Slough/Fib Fascia Exp Fat Layer Tendon Exp Muscle Exp Joint Expo Bone Expos Assessment Notes scant bleeding Treatment Notes Wound #2 (Left Toe Second) 1. Cleanse With Wound Cleanser 3. Primary Dressing Applied Calcium Alginate Ag 4. Secondary Dressing Dry Gauze Roll Gauze 5. Secured With [employer]) Signed: 10/20/2018 3:43:33 PM By: Mikeal Hawthorne EMT/HBOT Signed: 10/20/2018 5:18:03 PM By: Kela Millin Previous Signature:  10/17/2018 5:13:27 PM Version By: Kela Millin Entered By: Mikeal Hawthorne on 10/20/2018 09:27:03 -------------------------------------------------------------------------------- Vitals Details Patient Name: Date of Service: Nathaniel White 10/17/2018 2:30 PM Medical Record [phone] Patient Account Number: [id number] Date of Birth/Sex: Treating RN: 1960-12-19 (58 y.o. Nathaniel White Primary Care Danaija Eskridge: Geryl Rankins Other Clinician: Referring Monea Pesantez: Treating Kelan Pritt/Extender:Robson, Rodrigo Ran, [name] in Treatment: 15 Vital Signs Time Taken: 14:55 Temperature (F): 98.4 Height (in): 69 Pulse (bpm): 90 Weight (lbs): 170 Respiratory Rate (breaths/min): 18 Body Mass Index (BMI): 25.1 Blood Pressure (mmHg): 118/99 Reference Range: 80 - 120 mg / dl Electronic Signature(s) Signed: 10/17/2018 5:13:27 PM By: Kela Millin Entered By: Kela Millin on 10/17/2018 15:09:06

## 2018-10-21 NOTE — Progress Notes (Signed)
Nathaniel White, Nathaniel White ([id number]) Visit Report for 10/17/2018 Debridement Details Patient Name: Date of Service: Nathaniel White, Nathaniel White 10/17/2018 2:30 PM Medical Record [id number] Patient Account Number: [id number] Date of Birth/Sex: Treating RN: 12/28/1960 (58 y.o. [name] Primary Care Provider: Geryl Rankins Other Clinician: Referring Provider: Treating Provider/Extender:Robson, Rodrigo Ran, Paulette Blanch in Treatment: 15 Debridement Performed for Wound #1 Left,Lateral Calcaneus Assessment: Performed By: Physician Ricard Dillon., MD Debridement Type: Debridement Severity of Tissue Pre Fat layer exposed Debridement: Level of Consciousness (Pre- Awake and Alert procedure): Pre-procedure Verification/Time Out Taken: Yes - 15:28 Start Time: 15:28 Total Area Debrided (L x W): 4 (cm) x 4 (cm) = 16 (cm) Tissue and other material Viable, Non-Viable, Eschar, Subcutaneous debrided: Level: Skin/Subcutaneous Tissue Debridement Description: Excisional Instrument: Curette Bleeding: Moderate Hemostasis Achieved: Pressure End Time: 15:32 Procedural Pain: 0 Post Procedural Pain: 0 Response to Treatment: Procedure was tolerated well Level of Consciousness Awake and Alert (Post-procedure): Post Debridement Measurements of Total Wound Length: (cm) 7.2 Width: (cm) 14.2 Depth: (cm) 0.5 Volume: (cm) 40.15 Character of Wound/Ulcer Post Requires Further Debridement Debridement: Severity of Tissue Post Debridement: Fat layer exposed Post Procedure Diagnosis Same as Pre-procedure Electronic Signature(s) Signed: 10/19/2018 10:27:00 AM By: Linton Ham MD Signed: 10/21/2018 5:52:09 PM By: Levan Hurst RN, BSN Entered By: Linton Ham on 10/19/2018 10:13:37 -------------------------------------------------------------------------------- HPI Details Patient Name: Date of Service: Nathaniel White 10/17/2018 2:30 PM Medical Record [id number] Patient Account  Number: [id number] Date of Birth/Sex: Treating RN: 02/13/1960 (58 y.o. [name] Primary Care Provider: Geryl Rankins Other Clinician: Referring Provider: Treating Provider/Extender:Robson, Rodrigo Ran, Paulette Blanch in Treatment: 15 History of Present Illness HPI Description: ADMISSION 06/30/2018 The patient is a 58 year old man with multiple medical issues including type 2 diabetes on dialysis. Currently the patient his dialysis is actually caused by nephrosclerosis and he has been on dialysis for 20 years. Roughly a month ago according to the patient he noticed a small open area on his left heel he showed it to his caregiver and according to her this rapidly expanded. Looking through [facility] health link he was in the ER with left foot pain on 4/3. An x-ray was negative noting only pedal vessel atherosclerosis. He was also seen on 4/22 at that point he was felt to have a cellulitis and abscess of the left foot he was given Silvadene. After her third visit to the ER and a visit to podiatry [Dr. Price] he was referred here. He has been using Silvadene. The patient thinks that he was bitten by something initially, but he did not actually see this The patient has a history of type 2 diabetes although he is not currently on anything for this and his hemoglobin A1c was 5; COPD, dilated cardiomyopathy, mitral regurgitation, CHF, remote CVA with left-sided hemiparesis, hypertension ABI in our clinic was not easy to obtain although we were able to get 0.93 on the left 6/29; I did a debridement on this last time however once again it he comes in with a mostly black eschar at surface. Very tender. We have not yet managed to get arterial studies on him. I will see if I can get them done through Dr. Kennon Holter office 7/9; his arterial studies were done on 7/1. On the right side his studies are not too bad with noncompressible ABIs at the PTA great TBI of 0.64 and monophasic waveforms on the left  however an ABI could not be obtained at the ATA, at the PTA his ABI was 0.2 with  a dampened monophasic waveforms there was no pressure in the great toe. He is developed a gangrenous-looking second toes since the last time I saw this patient. There is an ulcer on the medial aspect the second toe. The area on his heel is declining 7/21; since the patient was last here he was revascularized by Dr. Donzetta Matters on 07/21/2018. Noteworthy on the left that the SFA was approximately 50% stenosed at the takeoff but this was not treated. Popliteal artery had heavy areas of calcification and included in one segment and reconstituted just beyond this. This area was stented. After stenting there was 0% residual stenosis. It was noted he appeared to have three-vessel runoff via the tibials which are heavily calcified but without focal occlusions. The patient does not complain of pain. The area on the left lateral heel is a deep necrotic wound it does not look any more viable this week. He has a gangrenous second toe which I do not think is going to be viable. Finally he has a new area on the left posterior calf 7/28; patient arrives with the mummified second toe. The large necrotic wound on the left lateral calcaneus is separating black eschar. He had a new area superiorly on the posterior calf. Home health could not get Iodoflex and he arrived with calcium alginate. He has been revascularized by Dr. Donzetta Matters patient is not aware when his follow-up appointment is. 8/4-Patient is back to the clinic with the mummified second toe on the left, left calcaneal ulcer with black eschar and visible tendon. His appointment with Dr. Gwenlyn Saran is on the 14th of this month. I have indicated to him that this is important to keep 8/11-8/11-Patient comes back for his left medial ankle wound, the more distal wound is bigger, has a lot of necrotic tissue, greenish drainage, eschar coating a portion of the wound 8/18; I believe the patient has  had follow-up arterial studies ordered by Dr. Donzetta Matters but I do not think they have been done and the patient and his friend are not aware of an appointment time. He has a mummified left second toe and a small area on the left posterior calf in the mid aspect. This substantial area is on the left Achilles area extending into the left lateral heel. This is a deep necrotic wound. It no longer has the black gangrenous surface. His friend says that there is still a malodorous discharge. I note that Dr. Evette Doffing did a culture of this area on 8/12. This grew Enterococcus faecalis and E. coli. He is started on Augmentin although the E. coli only has intermediate sensitivities here. He was referred to infectious disease. We could add either a first or third generation cephalosporin which should cover the E. coli although I did not do this today. 8/25; the patient has a mummified left second toe a substantial wound over the left Achilles area with exposed tendon. And a small area on the left posterior calf. He has been revascularized by Dr. Donzetta Matters in July. He has not made it for his follow-up noninvasive studies. X-ray I did of the left heel last week showed soft tissue gas along the posterior aspect of the heel and soft tissue defect and findings suspicious for osteomyelitis involving the distal aspect of the second proximal phalanx. This is the mummified toe. The patient went to see Dr. Linus Salmons of infectious disease. I am not able to really follow the flow of his note but according to when I am seeing he did not add  or suggest any additional antibiotics unless he called these into dialysis. 9/1; I was able to review Dr. Henreitta Leber notes he did not feel any further antibiotics were indicated. I still do not see where the patient had his postprocedure noninvasive studies. We have been using silver collagen to the substantial area on the left Achilles. This has exposed tendon. He has a mummified left second toe. I am  doubtful there is any viable tissue here. The patient is nonambulatory. He has flexion contractures of his left hip and knee. 10/1; the patient has not been here in a month I am really not sure why. He was seen by vein and vascular on 9/25 he had his noninvasive studies done. His monophasic waveforms on the left were noted. He had Dopplers across the stent which was felt to be patent. The patient was seen by Dagoberto Ligas PAC. The arterial duplex today demonstrated a patent popliteal stent with improved TBI. Apparently his caregiver demanded an amputation but this was not felt to be necessary. He is on aspirin and Plavix. Dressings are being changed by home health he was using Prisma. 10/9; patient sees the vascular next week. We will call urgently this morning by home health report that the mom applied left second toe had "almost fallen off". Nevertheless the patient arrived in my clinic today. He has a lateral left heel wound. Electronic Signature(s) Signed: 10/19/2018 10:27:00 AM By: Linton Ham MD Entered By: Linton Ham on 10/19/2018 10:16:07 -------------------------------------------------------------------------------- Physical Exam Details Patient Name: Date of Service: Nathaniel White, Nathaniel White 10/17/2018 2:30 PM Medical Record [id number] Patient Account Number: [id number] Date of Birth/Sex: Treating RN: February 06, 1960 (58 y.o. [name] Primary Care Provider: Geryl Rankins Other Clinician: Referring Provider: Treating Provider/Extender:Robson, Rodrigo Ran, Paulette Blanch in Treatment: 15 Constitutional Patient is hypertensive.. Pulse regular and within target range for patient.Marland Kitchen Respirations regular, non-labored and within target range.Marland Kitchen Appears in no distress. Respiratory work of breathing is normal. Cardiovascular pedal pulses are not palpable. Notes exam; the patient has a large wound on the left Achilles. Again required debridement today.removing  necrotic surface material slough. In general the area does not look too bad there is bleeding which I think is generally good sign and no infection. surface area is improving. The patient traumatized the mom applied left second toe apparently well putting on clothing. This is a large separation medially this dose to bone Electronic Signature(s) Signed: 10/19/2018 10:27:00 AM By: Linton Ham MD Entered By: Linton Ham on 10/19/2018 10:19:03 -------------------------------------------------------------------------------- Physician Orders Details Patient Name: Date of Service: Nathaniel White, Nathaniel White 10/17/2018 2:30 PM Medical Record [id number] Patient Account Number: [id number] Date of Birth/Sex: Treating RN: October 21, 1960 (58 y.o. [name] Primary Care Provider: Geryl Rankins Other Clinician: Referring Provider: Treating Provider/Extender:Robson, Rodrigo Ran, Paulette Blanch in Treatment: 15 Verbal / Phone Orders: No Diagnosis Coding ICD-10 Coding Code Description E11.621 Type 2 diabetes mellitus with foot ulcer E11.52 Type 2 diabetes mellitus with diabetic peripheral angiopathy with gangrene I87.333 Chronic venous hypertension (idiopathic) with ulcer and inflammation of bilateral lower extremity L97.423 Non-pressure chronic ulcer of left heel and midfoot with necrosis of muscle L97.221 Non-pressure chronic ulcer of left calf limited to breakdown of skin Follow-up Appointments Return Appointment in 1 week. Dressing Change Frequency Wound #1 Left,Lateral Calcaneus Change dressing three times week. Wound #2 Left Toe Second Change dressing three times week. Wound Cleansing Wound #1 Left,Lateral Calcaneus Clean wound with Wound Cleanser - or normal saline Wound #2 Left Toe Second Clean wound with Wound Cleanser -  or normal saline Primary Wound Dressing Wound #1 Left,Lateral Calcaneus Calcium Alginate with Silver Wound #2 Left Toe Second Calcium Alginate with  Silver Secondary Dressing Wound #1 Left,Lateral Calcaneus Kerlix/Rolled Gauze Dry Gauze ABD pad Wound #2 Left Toe Second Kerlix/Rolled Gauze Dry Gauze Off-Loading Other: - float feet off of bed/chair by placing pillow under legs [facility] for wound care. - [facility] Electronic Signature(s) Signed: 10/19/2018 10:27:00 AM By: Linton Ham MD Signed: 10/21/2018 5:52:09 PM By: Levan Hurst RN, BSN Entered By: Levan Hurst on 10/17/2018 15:34:37 -------------------------------------------------------------------------------- Problem List Details Patient Name: Date of Service: Nathaniel White, Nathaniel White 10/17/2018 2:30 PM Medical Record [id number] Patient Account Number: [id number] Date of Birth/Sex: Treating RN: [birth year] (58 y.o. [name] Primary Care Provider: Geryl Rankins Other Clinician: Referring Provider: Treating Provider/Extender:Robson, Rodrigo Ran, Paulette Blanch in Treatment: 15 Active Problems ICD-10 Evaluated Encounter Code Description Active Date Today Diagnosis E11.621 Type 2 diabetes mellitus with foot ulcer 06/30/2018 No Yes E11.52 Type 2 diabetes mellitus with diabetic peripheral 07/17/2018 No Yes angiopathy with gangrene I87.333 Chronic venous hypertension (idiopathic) with ulcer 06/30/2018 No Yes and inflammation of bilateral lower extremity L97.423 Non-pressure chronic ulcer of left heel and midfoot 07/29/2018 No Yes with necrosis of muscle L97.221 Non-pressure chronic ulcer of left calf limited to 07/29/2018 No Yes breakdown of skin Inactive Problems ICD-10 Code Description Active Date Inactive Date E11.51 Type 2 diabetes mellitus with diabetic peripheral angiopathy 06/30/2018 06/30/2018 without gangrene Resolved Problems Electronic Signature(s) Signed: 10/19/2018 10:27:00 AM By: Linton Ham MD Entered By: Linton Ham on 10/19/2018  10:08:30 -------------------------------------------------------------------------------- Progress Note Details Patient Name: Date of Service: Nathaniel White 10/17/2018 2:30 PM Medical Record [id number] Patient Account Number: [id number] Date of Birth/Sex: Treating RN: [birth year] (58 y.o. [name] Primary Care Provider: Geryl Rankins Other Clinician: Referring Provider: Treating Provider/Extender:Robson, Rodrigo Ran, Paulette Blanch in Treatment: 15 Subjective History of Present Illness (HPI) ADMISSION 06/30/2018 The patient is a 58 year old man with multiple medical issues including type 2 diabetes on dialysis. Currently the patient his dialysis is actually caused by nephrosclerosis and he has been on dialysis for 20 years. Roughly a month ago according to the patient he noticed a small open area on his left heel he showed it to his caregiver and according to her this rapidly expanded. Looking through [facility] health link he was in the ER with left foot pain on 4/3. An x-ray was negative noting only pedal vessel atherosclerosis. He was also seen on 4/22 at that point he was felt to have a cellulitis and abscess of the left foot he was given Silvadene. After her third visit to the ER and a visit to podiatry [Dr. Price] he was referred here. He has been using Silvadene. The patient thinks that he was bitten by something initially, but he did not actually see this The patient has a history of type 2 diabetes although he is not currently on anything for this and his hemoglobin A1c was 5; COPD, dilated cardiomyopathy, mitral regurgitation, CHF, remote CVA with left-sided hemiparesis, hypertension ABI in our clinic was not easy to obtain although we were able to get 0.93 on the left 6/29; I did a debridement on this last time however once again it he comes in with a mostly black eschar at surface. Very tender. We have not yet managed to get arterial studies on him. I will see if  I can get them done through Dr. Kennon Holter office 7/9; his arterial studies were done on  7/1. On the right side his studies are not too bad with noncompressible ABIs at the PTA great TBI of 0.64 and monophasic waveforms on the left however an ABI could not be obtained at the ATA, at the PTA his ABI was 0.2 with a dampened monophasic waveforms there was no pressure in the great toe. He is developed a gangrenous-looking second toes since the last time I saw this patient. There is an ulcer on the medial aspect the second toe. The area on his heel is declining 7/21; since the patient was last here he was revascularized by Dr. Donzetta Matters on 07/21/2018. Noteworthy on the left that the SFA was approximately 50% stenosed at the takeoff but this was not treated. Popliteal artery had heavy areas of calcification and included in one segment and reconstituted just beyond this. This area was stented. After stenting there was 0% residual stenosis. It was noted he appeared to have three-vessel runoff via the tibials which are heavily calcified but without focal occlusions. The patient does not complain of pain. The area on the left lateral heel is a deep necrotic wound it does not look any more viable this week. He has a gangrenous second toe which I do not think is going to be viable. Finally he has a new area on the left posterior calf 7/28; patient arrives with the mummified second toe. The large necrotic wound on the left lateral calcaneus is separating black eschar. He had a new area superiorly on the posterior calf. Home health could not get Iodoflex and he arrived with calcium alginate. He has been revascularized by Dr. Donzetta Matters patient is not aware when his follow-up appointment is. 8/4-Patient is back to the clinic with the mummified second toe on the left, left calcaneal ulcer with black eschar and visible tendon. His appointment with Dr. Gwenlyn Saran is on the 14th of this month. I have indicated to him that this  is important to keep 8/11-8/11-Patient comes back for his left medial ankle wound, the more distal wound is bigger, has a lot of necrotic tissue, greenish drainage, eschar coating a portion of the wound 8/18; I believe the patient has had follow-up arterial studies ordered by Dr. Donzetta Matters but I do not think they have been done and the patient and his friend are not aware of an appointment time. He has a mummified left second toe and a small area on the left posterior calf in the mid aspect. This substantial area is on the left Achilles area extending into the left lateral heel. This is a deep necrotic wound. It no longer has the black gangrenous surface. His friend says that there is still a malodorous discharge. I note that Dr. Evette Doffing did a culture of this area on 8/12. This grew Enterococcus faecalis and E. coli. He is started on Augmentin although the E. coli only has intermediate sensitivities here. He was referred to infectious disease. We could add either a first or third generation cephalosporin which should cover the E. coli although I did not do this today. 8/25; the patient has a mummified left second toe a substantial wound over the left Achilles area with exposed tendon. And a small area on the left posterior calf. He has been revascularized by Dr. Donzetta Matters in July. He has not made it for his follow-up noninvasive studies. X-ray I did of the left heel last week showed soft tissue gas along the posterior aspect of the heel and soft tissue defect and findings suspicious for osteomyelitis involving the  distal aspect of the second proximal phalanx. This is the mummified toe. The patient went to see Dr. Linus Salmons of infectious disease. I am not able to really follow the flow of his note but according to when I am seeing he did not add or suggest any additional antibiotics unless he called these into dialysis. 9/1; I was able to review Dr. Henreitta Leber notes he did not feel any further antibiotics were  indicated. I still do not see where the patient had his postprocedure noninvasive studies. We have been using silver collagen to the substantial area on the left Achilles. This has exposed tendon. He has a mummified left second toe. I am doubtful there is any viable tissue here. The patient is nonambulatory. He has flexion contractures of his left hip and knee. 10/1; the patient has not been here in a month I am really not sure why. He was seen by vein and vascular on 9/25 he had his noninvasive studies done. His monophasic waveforms on the left were noted. He had Dopplers across the stent which was felt to be patent. The patient was seen by Dagoberto Ligas PAC. The arterial duplex today demonstrated a patent popliteal stent with improved TBI. Apparently his caregiver demanded an amputation but this was not felt to be necessary. He is on aspirin and Plavix. Dressings are being changed by home health he was using Prisma. 10/9; patient sees the vascular next week. We will call urgently this morning by home health report that the mom applied left second toe had "almost fallen off". Nevertheless the patient arrived in my clinic today. He has a lateral left heel wound. Objective Constitutional Patient is hypertensive.. Pulse regular and within target range for patient.Marland Kitchen Respirations regular, non-labored and within target range.Marland Kitchen Appears in no distress. Vitals Time Taken: 2:55 PM, Height: 69 in, Weight: 170 lbs, BMI: 25.1, Temperature: 98.4 F, Pulse: 90 bpm, Respiratory Rate: 18 breaths/min, Blood Pressure: 118/99 mmHg. Respiratory work of breathing is normal. Cardiovascular pedal pulses are not palpable. General Notes: exam; the patient has a large wound on the left Achilles. Again required debridement today.removing necrotic surface material slough. In general the area does not look too bad there is bleeding which I think is generally good sign and no infection. surface area is improving. The  patient traumatized the mom applied left second toe apparently well putting on clothing. This is a large separation medially this dose to bone Integumentary (Hair, Skin) Wound #1 status is Open. Original cause of wound was Gradually Appeared. The wound is located on the Left,Lateral Calcaneus. The wound measures 7.2cm length x 14.2cm width x 0.5cm depth; 80.299cm^2 area and 40.15cm^3 volume. There is muscle, tendon, and Fat Layer (Subcutaneous Tissue) Exposed exposed. There is no tunneling or undermining noted. There is a medium amount of purulent drainage noted. Foul odor after cleansing was noted. The wound margin is well defined and not attached to the wound base. There is medium (34-66%) red granulation within the wound bed. There is a medium (34-66%) amount of necrotic tissue within the wound bed including Eschar, Adherent Slough and Necrosis of Muscle. Wound #2 status is Open. Original cause of wound was Pressure Injury. The wound is located on the Left Toe Second. The wound measures 2.5cm length x 6cm width x 0.4cm depth; 11.781cm^2 area and 4.712cm^3 volume. There is no tunneling or undermining noted. There is a medium amount of purulent drainage noted. Foul odor after cleansing was noted. The wound margin is well defined and not attached  to the wound base. There is no granulation within the wound bed. There is a large (67-100%) amount of necrotic tissue within the wound bed including Eschar and Adherent Slough. General Notes: scant bleeding Assessment Active Problems ICD-10 Type 2 diabetes mellitus with foot ulcer Type 2 diabetes mellitus with diabetic peripheral angiopathy with gangrene Chronic venous hypertension (idiopathic) with ulcer and inflammation of bilateral lower extremity Non-pressure chronic ulcer of left heel and midfoot with necrosis of muscle Non-pressure chronic ulcer of left calf limited to breakdown of skin Procedures Wound #1 Pre-procedure diagnosis of Wound #1  is a Diabetic Wound/Ulcer of the Lower Extremity located on the Left,Lateral Calcaneus .Severity of Tissue Pre Debridement is: Fat layer exposed. There was a Excisional Skin/Subcutaneous Tissue Debridement with a total area of 16 sq cm performed by Ricard Dillon., MD. With the following instrument(s): Curette to remove Viable and Non-Viable tissue/material. Material removed includes Eschar and Subcutaneous Tissue and. No specimens were taken. A time out was conducted at 15:28, prior to the start of the procedure. A Moderate amount of bleeding was controlled with Pressure. The procedure was tolerated well with a pain level of 0 throughout and a pain level of 0 following the procedure. Post Debridement Measurements: 7.2cm length x 14.2cm width x 0.5cm depth; 40.15cm^3 volume. Character of Wound/Ulcer Post Debridement requires further debridement. Severity of Tissue Post Debridement is: Fat layer exposed. Post procedure Diagnosis Wound #1: Same as Pre-Procedure Plan Follow-up Appointments: Return Appointment in 1 week. Dressing Change Frequency: Wound #1 Left,Lateral Calcaneus: Change dressing three times week. Wound #2 Left Toe Second: Change dressing three times week. Wound Cleansing: Wound #1 Left,Lateral Calcaneus: Clean wound with Wound Cleanser - or normal saline Wound #2 Left Toe Second: Clean wound with Wound Cleanser - or normal saline Primary Wound Dressing: Wound #1 Left,Lateral Calcaneus: Calcium Alginate with Silver Wound #2 Left Toe Second: Calcium Alginate with Silver Secondary Dressing: Wound #1 Left,Lateral Calcaneus: Kerlix/Rolled Gauze Dry Gauze ABD pad Wound #2 Left Toe Second: Kerlix/Rolled Gauze Dry Gauze Off-Loading: Other: - float feet off of bed/chair by placing pillow under legs Home Health: [facility] for [facility]. - [facility]1continue his silver alginate as the primary dressing to the area of the left lateral  calcaneus. This is coming down in terms of service area and volume although he still has a lot of debris week. 2 he says vein and vascular next week. Among other things going to need to contact them about this second toe which unfortunately probably needs to be amputated. This had a deep laceration type injury on the medial aspect and goes to bone. We used silver alginate here as well. He is likely to need this amputated Electronic Signature(s) Signed: 10/19/2018 10:27:00 AM By: Linton Ham MD Entered By: Linton Ham on 10/19/2018 10:20:38 -------------------------------------------------------------------------------- SuperBill Details Patient Name: Date of Service: Nathaniel White, Nathaniel White 10/17/2018 Medical Record [id number] Patient Account Number: [id number] Date of Birth/Sex: Treating RN: 1960-02-25 (58 y.o. 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

## 2018-10-22 DIAGNOSIS — Z4800 Encounter for change or removal of nonsurgical wound dressing: Secondary | ICD-10-CM | POA: Diagnosis not present

## 2018-10-22 DIAGNOSIS — E1122 Type 2 diabetes mellitus with diabetic chronic kidney disease: Secondary | ICD-10-CM | POA: Diagnosis not present

## 2018-10-22 DIAGNOSIS — E11621 Type 2 diabetes mellitus with foot ulcer: Secondary | ICD-10-CM | POA: Diagnosis not present

## 2018-10-22 DIAGNOSIS — I132 Hypertensive heart and chronic kidney disease with heart failure and with stage 5 chronic kidney disease, or end stage renal disease: Secondary | ICD-10-CM | POA: Diagnosis not present

## 2018-10-22 DIAGNOSIS — E1151 Type 2 diabetes mellitus with diabetic peripheral angiopathy without gangrene: Secondary | ICD-10-CM | POA: Diagnosis not present

## 2018-10-22 DIAGNOSIS — L97422 Non-pressure chronic ulcer of left heel and midfoot with fat layer exposed: Secondary | ICD-10-CM | POA: Diagnosis not present

## 2018-10-23 DIAGNOSIS — E119 Type 2 diabetes mellitus without complications: Secondary | ICD-10-CM | POA: Diagnosis not present

## 2018-10-23 DIAGNOSIS — Z992 Dependence on renal dialysis: Secondary | ICD-10-CM | POA: Diagnosis not present

## 2018-10-23 DIAGNOSIS — N2581 Secondary hyperparathyroidism of renal origin: Secondary | ICD-10-CM | POA: Diagnosis not present

## 2018-10-23 DIAGNOSIS — N186 End stage renal disease: Secondary | ICD-10-CM | POA: Diagnosis not present

## 2018-10-23 DIAGNOSIS — D509 Iron deficiency anemia, unspecified: Secondary | ICD-10-CM | POA: Diagnosis not present

## 2018-10-24 ENCOUNTER — Other Ambulatory Visit: Payer: Self-pay

## 2018-10-24 ENCOUNTER — Encounter (HOSPITAL_BASED_OUTPATIENT_CLINIC_OR_DEPARTMENT_OTHER): Payer: Medicare Other | Admitting: Internal Medicine

## 2018-10-24 ENCOUNTER — Other Ambulatory Visit: Payer: Self-pay | Admitting: *Deleted

## 2018-10-24 ENCOUNTER — Ambulatory Visit (INDEPENDENT_AMBULATORY_CARE_PROVIDER_SITE_OTHER): Payer: Medicare Other | Admitting: Vascular Surgery

## 2018-10-24 ENCOUNTER — Encounter: Payer: Self-pay | Admitting: Vascular Surgery

## 2018-10-24 ENCOUNTER — Encounter: Payer: Self-pay | Admitting: *Deleted

## 2018-10-24 VITALS — BP 98/63 | HR 90 | Temp 97.5°F | Resp 20 | Ht 69.0 in | Wt 165.0 lb

## 2018-10-24 DIAGNOSIS — Z992 Dependence on renal dialysis: Secondary | ICD-10-CM

## 2018-10-24 DIAGNOSIS — E1152 Type 2 diabetes mellitus with diabetic peripheral angiopathy with gangrene: Secondary | ICD-10-CM | POA: Diagnosis not present

## 2018-10-24 DIAGNOSIS — L97423 Non-pressure chronic ulcer of left heel and midfoot with necrosis of muscle: Secondary | ICD-10-CM | POA: Diagnosis not present

## 2018-10-24 DIAGNOSIS — I132 Hypertensive heart and chronic kidney disease with heart failure and with stage 5 chronic kidney disease, or end stage renal disease: Secondary | ICD-10-CM | POA: Diagnosis not present

## 2018-10-24 DIAGNOSIS — N186 End stage renal disease: Secondary | ICD-10-CM | POA: Diagnosis not present

## 2018-10-24 DIAGNOSIS — L97526 Non-pressure chronic ulcer of other part of left foot with bone involvement without evidence of necrosis: Secondary | ICD-10-CM | POA: Diagnosis not present

## 2018-10-24 DIAGNOSIS — I739 Peripheral vascular disease, unspecified: Secondary | ICD-10-CM | POA: Diagnosis not present

## 2018-10-24 DIAGNOSIS — I87332 Chronic venous hypertension (idiopathic) with ulcer and inflammation of left lower extremity: Secondary | ICD-10-CM | POA: Diagnosis not present

## 2018-10-24 DIAGNOSIS — E11621 Type 2 diabetes mellitus with foot ulcer: Secondary | ICD-10-CM | POA: Diagnosis not present

## 2018-10-24 DIAGNOSIS — E1122 Type 2 diabetes mellitus with diabetic chronic kidney disease: Secondary | ICD-10-CM | POA: Diagnosis not present

## 2018-10-24 DIAGNOSIS — I96 Gangrene, not elsewhere classified: Secondary | ICD-10-CM | POA: Diagnosis not present

## 2018-10-24 DIAGNOSIS — L97422 Non-pressure chronic ulcer of left heel and midfoot with fat layer exposed: Secondary | ICD-10-CM | POA: Diagnosis not present

## 2018-10-24 DIAGNOSIS — E1151 Type 2 diabetes mellitus with diabetic peripheral angiopathy without gangrene: Secondary | ICD-10-CM | POA: Diagnosis not present

## 2018-10-24 DIAGNOSIS — Z4800 Encounter for change or removal of nonsurgical wound dressing: Secondary | ICD-10-CM | POA: Diagnosis not present

## 2018-10-24 NOTE — Progress Notes (Signed)
Nathaniel White, Nathaniel White ([id number]) Visit Report for 10/24/2018 Debridement Details Patient Name: Date of Service: Nathaniel White, Nathaniel White 10/24/2018 3:00 PM Medical Record [id number] Patient Account Number: [id number] Date of Birth/Sex: March 04, 1960 (58 y.o. M) Treating RN: Levan Hurst Primary Care Provider: Geryl Rankins Other Clinician: Referring Provider: Treating Provider/Extender:Jeziel Hoffmann, Rodrigo Ran, Paulette Blanch in Treatment: 16 Debridement Performed for Wound #1 Left,Lateral Calcaneus Assessment: Performed By: Physician Ricard Dillon., MD Debridement Type: Debridement Severity of Tissue Pre Fat layer exposed Debridement: Level of Consciousness (Pre- Awake and Alert procedure): Pre-procedure Verification/Time Out Taken: Yes - 16:07 Start Time: 16:07 Total Area Debrided (L x W): 4.5 (cm) x 12 (cm) = 54 (cm) Tissue and other material Viable, Non-Viable, Slough, Subcutaneous, Slough debrided: Level: Skin/Subcutaneous Tissue Debridement Description: Excisional Instrument: Curette Bleeding: Moderate Hemostasis Achieved: Pressure End Time: 16:08 Procedural Pain: 3 Post Procedural Pain: 0 Response to Treatment: Procedure was tolerated well Level of Consciousness Awake and Alert (Post-procedure): Post Debridement Measurements of Total Wound Length: (cm) 4.5 Width: (cm) 12 Depth: (cm) 0.4 Volume: (cm) 16.965 Character of Wound/Ulcer Post Improved Debridement: Severity of Tissue Post Debridement: Fat layer exposed Post Procedure Diagnosis Same as Pre-procedure Electronic Signature(s) Signed: 10/24/2018 5:33:39 PM By: Linton Ham MD Signed: 10/24/2018 6:00:55 PM By: Levan Hurst RN, BSN Entered By: Linton Ham on 10/24/2018 17:05:21 -------------------------------------------------------------------------------- HPI Details Patient Name: Date of Service: Nathaniel White. 10/24/2018 3:00 PM Medical Record [id number] Patient Account Number:  [id number] Date of Birth/Sex: Treating RN: [birth year] (58 y.o. [name] Primary Care Provider: Geryl Rankins Other Clinician: Referring Provider: Treating Provider/Extender:Nathaniel White, Rodrigo Ran, Paulette Blanch in Treatment: 16 History of Present Illness HPI Description: ADMISSION 06/30/2018 The patient is a 58 year old man with multiple medical issues including type 2 diabetes on dialysis. Currently the patient his dialysis is actually caused by nephrosclerosis and he has been on dialysis for 20 years. Roughly a month ago according to the patient he noticed a small open area on his left heel he showed it to his caregiver and according to her this rapidly expanded. Looking through [facility] health link he was in the ER with left foot pain on 4/3. An x-ray was negative noting only pedal vessel atherosclerosis. He was also seen on 4/22 at that point he was felt to have a cellulitis and abscess of the left foot he was given Silvadene. After her third visit to the ER and a visit to podiatry [Dr. Price] he was referred here. He has been using Silvadene. The patient thinks that he was bitten by something initially, but he did not actually see this The patient has a history of type 2 diabetes although he is not currently on anything for this and his hemoglobin A1c was 5; COPD, dilated cardiomyopathy, mitral regurgitation, CHF, remote CVA with left-sided hemiparesis, hypertension ABI in our clinic was not easy to obtain although we were able to get 0.93 on the left 6/29; I did a debridement on this last time however once again it he comes in with a mostly black eschar at surface. Very tender. We have not yet managed to get arterial studies on him. I will see if I can get them done through Dr. Kennon Holter office 7/9; his arterial studies were done on 7/1. On the right side his studies are not too bad with noncompressible ABIs at the PTA great TBI of 0.64 and monophasic waveforms on the left however  an ABI could not be obtained at the ATA, at the PTA his ABI was 0.2 with a  dampened monophasic waveforms there was no pressure in the great toe. He is developed a gangrenous-looking second toes since the last time I saw this patient. There is an ulcer on the medial aspect the second toe. The area on his heel is declining 7/21; since the patient was last here he was revascularized by Dr. Donzetta Matters on 07/21/2018. Noteworthy on the left that the SFA was approximately 50% stenosed at the takeoff but this was not treated. Popliteal artery had heavy areas of calcification and included in one segment and reconstituted just beyond this. This area was stented. After stenting there was 0% residual stenosis. It was noted he appeared to have three-vessel runoff via the tibials which are heavily calcified but without focal occlusions. The patient does not complain of pain. The area on the left lateral heel is a deep necrotic wound it does not look any more viable this week. He has a gangrenous second toe which I do not think is going to be viable. Finally he has a new area on the left posterior calf 7/28; patient arrives with the mummified second toe. The large necrotic wound on the left lateral calcaneus is separating black eschar. He had a new area superiorly on the posterior calf. Home health could not get Iodoflex and he arrived with calcium alginate. He has been revascularized by Dr. Donzetta Matters patient is not aware when his follow-up appointment is. 8/4-Patient is back to the clinic with the mummified second toe on the left, left calcaneal ulcer with black eschar and visible tendon. His appointment with Dr. Gwenlyn Saran is on the 14th of this month. I have indicated to him that this is important to keep 8/11-8/11-Patient comes back for his left medial ankle wound, the more distal wound is bigger, has a lot of necrotic tissue, greenish drainage, eschar coating a portion of the wound 8/18; I believe the patient has had  follow-up arterial studies ordered by Dr. Donzetta Matters but I do not think they have been done and the patient and his friend are not aware of an appointment time. He has a mummified left second toe and a small area on the left posterior calf in the mid aspect. This substantial area is on the left Achilles area extending into the left lateral heel. This is a deep necrotic wound. It no longer has the black gangrenous surface. His friend says that there is still a malodorous discharge. I note that Dr. Evette Doffing did a culture of this area on 8/12. This grew Enterococcus faecalis and E. coli. He is started on Augmentin although the E. coli only has intermediate sensitivities here. He was referred to infectious disease. We could add either a first or third generation cephalosporin which should cover the E. coli although I did not do this today. 8/25; the patient has a mummified left second toe a substantial wound over the left Achilles area with exposed tendon. And a small area on the left posterior calf. He has been revascularized by Dr. Donzetta Matters in July. He has not made it for his follow-up noninvasive studies. X-ray I did of the left heel last week showed soft tissue gas along the posterior aspect of the heel and soft tissue defect and findings suspicious for osteomyelitis involving the distal aspect of the second proximal phalanx. This is the mummified toe. The patient went to see Dr. Linus Salmons of infectious disease. I am not able to really follow the flow of his note but according to when I am seeing he did not add or  suggest any additional antibiotics unless he called these into dialysis. 9/1; I was able to review Dr. Henreitta Leber notes he did not feel any further antibiotics were indicated. I still do not see where the patient had his postprocedure noninvasive studies. We have been using silver collagen to the substantial area on the left Achilles. This has exposed tendon. He has a mummified left second toe. I am  doubtful there is any viable tissue here. The patient is nonambulatory. He has flexion contractures of his left hip and knee. 10/1; the patient has not been here in a month I am really not sure why. He was seen by vein and vascular on 9/25 he had his noninvasive studies done. His monophasic waveforms on the left were noted. He had Dopplers across the stent which was felt to be patent. The patient was seen by Dagoberto Ligas PAC. The arterial duplex today demonstrated a patent popliteal stent with improved TBI. Apparently his caregiver demanded an amputation but this was not felt to be necessary. He is on aspirin and Plavix. Dressings are being changed by home health he was using Prisma. 10/9; patient sees the vascular next week. We will call urgently this morning by home health report that the mom applied left second toe had "almost fallen off". Nevertheless the patient arrived in my clinic today. He has a lateral left heel wound. 10/16; substantial wound on the left lateral heel extending into the Achilles area. Also a mummified area on the left second toe. I contacted Dr. Donzetta Matters about that earlier in the week. He agrees that the left second toe needs an amputation. Apparently noninvasive Doppler tests in Dr. Claretha Cooper office showed good blood flow according to the patient although I did not see this in the note Electronic Signature(s) Signed: 10/24/2018 5:33:39 PM By: Linton Ham MD Entered By: Linton Ham on 10/24/2018 17:07:30 -------------------------------------------------------------------------------- Physical Exam Details Patient Name: Date of Service: KEENE, GILKEY 10/24/2018 3:00 PM Medical Record [id number] Patient Account Number: [id number] Date of Birth/Sex: Treating RN: 1960/05/01 (58 y.o. [name] Primary Care Provider: Geryl Rankins Other Clinician: Referring Provider: Treating Provider/Extender:Demri Poulton, Rodrigo Ran, [name] in Treatment:  16 Constitutional Sitting or standing Blood Pressure is within target range for patient.. Pulse regular and within target range for patient.Marland Kitchen Respirations regular, non-labored and within target range.. Temperature is normal and within the target range for the patient.Marland Kitchen Appears in no distress. Notes Wound exam; the patient has a large wound in the left Achilles area extending into the left part of the heel. Using an open curette this was extensively debrided he has generally healthy granulation and good blood flow. Hemostasis with a pressure dressing. The left second toe is unlikely the last 2 weeks totally amputation I suspect this is going to come off. Electronic Signature(s) Signed: 10/24/2018 5:33:39 PM By: Linton Ham MD Entered By: Linton Ham on 10/24/2018 17:08:37 -------------------------------------------------------------------------------- Physician Orders Details Patient Name: Date of Service: [place], WENZLICK 10/24/2018 3:00 PM Medical Record [id number] Patient Account Number: [id number] Date of Birth/Sex: Treating RN: October 11, 1960 (58 y.o. [name] Primary Care Provider: Geryl Rankins Other Clinician: Referring Provider: Treating Provider/Extender:Kriston Pasquarello, Rodrigo Ran, Paulette Blanch in Treatment: 16 Verbal / Phone Orders: No Diagnosis Coding ICD-10 Coding Code Description E11.621 Type 2 diabetes mellitus with foot ulcer E11.52 Type 2 diabetes mellitus with diabetic peripheral angiopathy with gangrene I87.333 Chronic venous hypertension (idiopathic) with ulcer and inflammation of bilateral lower extremity L97.423 Non-pressure chronic ulcer of left heel and midfoot with necrosis of  muscle L97.221 Non-pressure chronic ulcer of left calf limited to breakdown of skin Follow-up Appointments Return Appointment in 1 week. Dressing Change Frequency Wound #1 Left,Lateral Calcaneus Change dressing three times week. Wound #2 Left Toe Second Change  dressing three times week. Wound Cleansing Wound #1 Left,Lateral Calcaneus Clean wound with Wound Cleanser - or normal saline Wound #2 Left Toe Second Clean wound with Wound Cleanser - or normal saline Primary Wound Dressing Wound #1 Left,Lateral Calcaneus Calcium Alginate with Silver Wound #2 Left Toe Second Calcium Alginate with Silver Secondary Dressing Wound #1 Left,Lateral Calcaneus Kerlix/Rolled Gauze Dry Gauze ABD pad Wound #2 Left Toe Second Kerlix/Rolled Gauze Dry Gauze Off-Loading Other: - float feet off of bed/chair by placing pillow under legs [facility] for wound care. - [facility] Electronic Signature(s) Signed: 10/24/2018 5:33:39 PM By: Linton Ham MD Signed: 10/24/2018 6:00:55 PM By: Levan Hurst RN, BSN Entered By: Levan Hurst on 10/24/2018 16:09:20 -------------------------------------------------------------------------------- Problem List Details Patient Name: Date of Service: SIRIS, HOOS 10/24/2018 3:00 PM Medical Record [id number] Patient Account Number: [id number] Date of Birth/Sex: Treating RN: 1960/08/01 (58 y.o. [name] Primary Care Provider: Geryl Rankins Other Clinician: Referring Provider: Treating Provider/Extender:Jovan Schickling, Rodrigo Ran, Paulette Blanch in Treatment: 16 Active Problems ICD-10 Evaluated Encounter Code Description Active Date Today Diagnosis E11.621 Type 2 diabetes mellitus with foot ulcer 06/30/2018 No Yes E11.52 Type 2 diabetes mellitus with diabetic peripheral 07/17/2018 No Yes angiopathy with gangrene I87.333 Chronic venous hypertension (idiopathic) with ulcer 06/30/2018 No Yes and inflammation of bilateral lower extremity L97.423 Non-pressure chronic ulcer of left heel and midfoot 07/29/2018 No Yes with necrosis of muscle L97.221 Non-pressure chronic ulcer of left calf limited to 07/29/2018 No Yes breakdown of skin Inactive Problems ICD-10 Code  Description Active Date Inactive Date E11.51 Type 2 diabetes mellitus with diabetic peripheral angiopathy 06/30/2018 06/30/2018 without gangrene Resolved Problems Electronic Signature(s) Signed: 10/24/2018 5:33:39 PM By: Linton Ham MD Entered By: Linton Ham on 10/24/2018 17:04:12 -------------------------------------------------------------------------------- Progress Note Details Patient Name: Date of Service: Nathaniel White 10/24/2018 3:00 PM Medical Record [id number] Patient Account Number: [id number] Date of Birth/Sex: Treating RN: July 14, 1960 (58 y.o. [name] Primary Care Provider: Geryl Rankins Other Clinician: Referring Provider: Treating Provider/Extender:Kalika Smay, Rodrigo Ran, Paulette Blanch in Treatment: 16 Subjective History of Present Illness (HPI) ADMISSION 06/30/2018 The patient is a 58 year old man with multiple medical issues including type 2 diabetes on dialysis. Currently the patient his dialysis is actually caused by nephrosclerosis and he has been on dialysis for 20 years. Roughly a month ago according to the patient he noticed a small open area on his left heel he showed it to his caregiver and according to her this rapidly expanded. Looking through [facility] health link he was in the ER with left foot pain on 4/3. An x-ray was negative noting only pedal vessel atherosclerosis. He was also seen on 4/22 at that point he was felt to have a cellulitis and abscess of the left foot he was given Silvadene. After her third visit to the ER and a visit to podiatry [Dr. Price] he was referred here. He has been using Silvadene. The patient thinks that he was bitten by something initially, but he did not actually see this The patient has a history of type 2 diabetes although he is not currently on anything for this and his hemoglobin A1c was 5; COPD, dilated cardiomyopathy, mitral regurgitation, CHF, remote CVA with left-sided  hemiparesis, hypertension ABI in our clinic was not easy  to obtain although we were able to get 0.93 on the left 6/29; I did a debridement on this last time however once again it he comes in with a mostly black eschar at surface. Very tender. We have not yet managed to get arterial studies on him. I will see if I can get them done through Dr. Kennon Holter office 7/9; his arterial studies were done on 7/1. On the right side his studies are not too bad with noncompressible ABIs at the PTA great TBI of 0.64 and monophasic waveforms on the left however an ABI could not be obtained at the ATA, at the PTA his ABI was 0.2 with a dampened monophasic waveforms there was no pressure in the great toe. He is developed a gangrenous-looking second toes since the last time I saw this patient. There is an ulcer on the medial aspect the second toe. The area on his heel is declining 7/21; since the patient was last here he was revascularized by Dr. Donzetta Matters on 07/21/2018. Noteworthy on the left that the SFA was approximately 50% stenosed at the takeoff but this was not treated. Popliteal artery had heavy areas of calcification and included in one segment and reconstituted just beyond this. This area was stented. After stenting there was 0% residual stenosis. It was noted he appeared to have three-vessel runoff via the tibials which are heavily calcified but without focal occlusions. The patient does not complain of pain. The area on the left lateral heel is a deep necrotic wound it does not look any more viable this week. He has a gangrenous second toe which I do not think is going to be viable. Finally he has a new area on the left posterior calf 7/28; patient arrives with the mummified second toe. The large necrotic wound on the left lateral calcaneus is separating black eschar. He had a new area superiorly on the posterior calf. Home health could not get Iodoflex and he arrived with calcium alginate. He has been  revascularized by Dr. Donzetta Matters patient is not aware when his follow-up appointment is. 8/4-Patient is back to the clinic with the mummified second toe on the left, left calcaneal ulcer with black eschar and visible tendon. His appointment with Dr. Gwenlyn Saran is on the 14th of this month. I have indicated to him that this is important to keep 8/11-8/11-Patient comes back for his left medial ankle wound, the more distal wound is bigger, has a lot of necrotic tissue, greenish drainage, eschar coating a portion of the wound 8/18; I believe the patient has had follow-up arterial studies ordered by Dr. Donzetta Matters but I do not think they have been done and the patient and his friend are not aware of an appointment time. He has a mummified left second toe and a small area on the left posterior calf in the mid aspect. This substantial area is on the left Achilles area extending into the left lateral heel. This is a deep necrotic wound. It no longer has the black gangrenous surface. His friend says that there is still a malodorous discharge. I note that Dr. Evette Doffing did a culture of this area on 8/12. This grew Enterococcus faecalis and E. coli. He is started on Augmentin although the E. coli only has intermediate sensitivities here. He was referred to infectious disease. We could add either a first or third generation cephalosporin which should cover the E. coli although I did not do this today. 8/25; the patient has a mummified left second toe a substantial  wound over the left Achilles area with exposed tendon. And a small area on the left posterior calf. He has been revascularized by Dr. Donzetta Matters in July. He has not made it for his follow-up noninvasive studies. X-ray I did of the left heel last week showed soft tissue gas along the posterior aspect of the heel and soft tissue defect and findings suspicious for osteomyelitis involving the distal aspect of the second proximal phalanx. This is the mummified toe. The patient  went to see Dr. Linus Salmons of infectious disease. I am not able to really follow the flow of his note but according to when I am seeing he did not add or suggest any additional antibiotics unless he called these into dialysis. 9/1; I was able to review Dr. Henreitta Leber notes he did not feel any further antibiotics were indicated. I still do not see where the patient had his postprocedure noninvasive studies. We have been using silver collagen to the substantial area on the left Achilles. This has exposed tendon. He has a mummified left second toe. I am doubtful there is any viable tissue here. The patient is nonambulatory. He has flexion contractures of his left hip and knee. 10/1; the patient has not been here in a month I am really not sure why. He was seen by vein and vascular on 9/25 he had his noninvasive studies done. His monophasic waveforms on the left were noted. He had Dopplers across the stent which was felt to be patent. The patient was seen by Dagoberto Ligas PAC. The arterial duplex today demonstrated a patent popliteal stent with improved TBI. Apparently his caregiver demanded an amputation but this was not felt to be necessary. He is on aspirin and Plavix. Dressings are being changed by home health he was using Prisma. 10/9; patient sees the vascular next week. We will call urgently this morning by home health report that the mom applied left second toe had "almost fallen off". Nevertheless the patient arrived in my clinic today. He has a lateral left heel wound. 10/16; substantial wound on the left lateral heel extending into the Achilles area. Also a mummified area on the left second toe. I contacted Dr. Donzetta Matters about that earlier in the week. He agrees that the left second toe needs an amputation. Apparently noninvasive Doppler tests in Dr. Claretha Cooper office showed good blood flow according to the patient although I did not see this in the note Objective Constitutional Sitting or standing  Blood Pressure is within target range for patient.. Pulse regular and within target range for patient.Marland Kitchen Respirations regular, non-labored and within target range.. Temperature is normal and within the target range for the patient.Marland Kitchen Appears in no distress. Vitals Time Taken: 3:33 PM, Height: 69 in, Weight: 170 lbs, BMI: 25.1, Temperature: 97.6 F, Pulse: 107 bpm, Respiratory Rate: 18 breaths/min, Blood Pressure: 123/68 mmHg. General Notes: Wound exam; the patient has a large wound in the left Achilles area extending into the left part of the heel. Using an open curette this was extensively debrided he has generally healthy granulation and good blood flow. Hemostasis with a pressure dressing. ooThe left second toe is unlikely the last 2 weeks totally amputation I suspect this is going to come off. Integumentary (Hair, Skin) Wound #1 status is Open. Original cause of wound was Gradually Appeared. The wound is located on the Left,Lateral Calcaneus. The wound measures 4.5cm length x 12cm width x 0.4cm depth; 42.412cm^2 area and 16.965cm^3 volume. There is muscle, tendon, and Fat Layer (Subcutaneous Tissue)  Exposed exposed. There is no tunneling or undermining noted. There is a medium amount of serosanguineous drainage noted. The wound margin is well defined and not attached to the wound base. There is medium (34-66%) red granulation within the wound bed. There is a medium (34-66%) amount of necrotic tissue within the wound bed including Eschar, Adherent Slough and Necrosis of Muscle. Wound #2 status is Open. Original cause of wound was Pressure Injury. The wound is located on the Left Toe Second. The wound measures 2.5cm length x 6cm width x 0.4cm depth; 11.781cm^2 area and 4.712cm^3 volume. There is bone and tendon exposed. There is no tunneling or undermining noted. There is a medium amount of serosanguineous drainage noted. The wound margin is well defined and not attached to the wound base.  There is no granulation within the wound bed. There is a large (67-100%) amount of necrotic tissue within the wound bed including Eschar and Adherent Slough. 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 There was a Excisional Skin/Subcutaneous Tissue Debridement with a total area of 54 sq cm performed by Ricard Dillon., MD. With the following instrument(s): Curette to remove Viable and Non-Viable tissue/material. Material removed includes Subcutaneous Tissue and Slough and. No specimens were taken. A time out was conducted at 16:07, prior to the start of the procedure. A Moderate amount of bleeding was controlled with Pressure. The procedure was tolerated well with a pain level of 3 throughout and a pain level of 0 following the procedure. Post Debridement Measurements: 4.5cm length x 12cm width x 0.4cm depth; 16.965cm^3 volume. Character of Wound/Ulcer Post Debridement is improved. Severity of Tissue Post Debridement is: Fat layer exposed. Post procedure Diagnosis Wound #1: Same as Pre-Procedure Plan Follow-up Appointments: Return Appointment in 1 week. Dressing Change Frequency: Wound #1 Left,Lateral Calcaneus: Change dressing three times week. Wound #2 Left Toe Second: Change dressing three times week. Wound Cleansing: Wound #1 Left,Lateral Calcaneus: Clean wound with Wound Cleanser - or normal saline Wound #2 Left Toe Second: Clean  wound with Wound Cleanser - or normal saline Primary Wound Dressing: Wound #1 Left,Lateral Calcaneus: Calcium Alginate with Silver Wound #2 Left Toe Second: Calcium Alginate with Silver Secondary Dressing: Wound #1 Left,Lateral Calcaneus: Kerlix/Rolled Gauze Dry Gauze ABD pad Wound #2 Left Toe Second: Kerlix/Rolled Gauze Dry Gauze Off-Loading: Other: - float feet off of bed/chair by placing pillow under legs Home Health: [facility] for wound care. - [facility] 1. Continue with silver alginate. The area to the Achilles to the lateral heel will probably need additional debridements although it looks better. 2. Need to follow-up with Dr. Donzetta Matters about the blood flow to this area however this wound bled briskly with debridement. 3. I do not expect the left second toe the last 2 weeks until the scheduled amputation Electronic Signature(s) Signed: 10/24/2018 5:33:39 PM By: Linton Ham MD Entered By: Linton Ham on 10/24/2018 17:09:36 -------------------------------------------------------------------------------- SuperBill Details Patient Name: Date of Service: Nathaniel White 10/24/2018 Medical Record [phone] Patient Account Number: [id number] Date of Birth/Sex: Treating RN:  [birth year] (58 y.o. 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 L97.221 Non-pressure chronic ulcer of left calf limited to breakdown of skin Facility Procedures CPT4 Code  Description: [id number] 11042 - DEB SUBQ TISSUE 20 SQ CM/< ICD-10 Diagnosis Description L97.423 Non-pressure chronic ulcer of left heel and midfoot with Modifier: necrosis of mus Quantity: 1 cle CPT4 Code Description: [id number] 11045 - DEB SUBQ TISS EA ADDL 20CM ICD-10 Diagnosis Description L97.423 Non-pressure chronic ulcer of left heel and midfoot with Modifier: necrosis of mus Quantity: 2 cle Physician Procedures CPT4 Code Description: [id number] 11042 - WC PHYS SUBQ TISS 20 SQ CM ICD-10 Diagnosis Description L97.423 Non-pressure chronic ulcer of left heel and midfoot with n Modifier: ecrosis of musc Quantity: 1 le CPT4 Code Description: [id number] 75883 - WC PHYS SUBQ TISS EA ADDL 20 CM ICD-10 Diagnosis Description L97.423 Non-pressure chronic ulcer of left heel and midfoot with n Modifier: ecrosis of musc Quantity: 2 le Electronic Signature(s) Signed: 10/24/2018 5:33:39 PM By: Linton Ham MD Entered By: Linton Ham on 10/24/2018 17:10:07

## 2018-10-24 NOTE — Progress Notes (Signed)
Patient ID: Nathaniel White, male   DOB: April 07, 1960, 58 y.o.   MRN: [id number]  Reason for Consult: Follow-up   Referred by Nathaniel Pounds, NP  Subjective:     HPI:  Nathaniel White is a 58 y.o. male history of end-stage renal disease on dialysis via left arm AV fistula.  He has a left heel second toe ulceration.  He is undergone revascularization with stenting of his left popliteal artery.  He does take aspirin Plavix and statin.  He has been adamant to refuse major amputation in the past.  He now presents for evaluation possible toe amputation.  Denies any fevers or chills.  He is followed closely with Dr. Dellia Nims at the wound center.  Past Medical History:  Diagnosis Date  . Anemia   . Anxiety   . Asthma   . CHF (congestive heart failure) (Nathaniel White)   . Complication from renal dialysis device 11/02/2013  . Complication of anesthesia    according to pt and spouse pt was moving around and cough while under and pt had difficulty waking up so the anesthesia had to be reversed. and pt admitted to ICU.   Nathaniel White COPD (chronic obstructive pulmonary disease) (Nathaniel White)   . ESOPHAGEAL VARICES 10/04/2008   Qualifier: Diagnosis of  By: Nathaniel White Nathaniel White ESRD    on HD, M- W- F   . Hemiparesis due to old stroke (Nathaniel White)    left  . Hypertension   . LV dysfunction    EF 25-30% by echo 07/2011  . Memory loss due to medical condition    due to stroke  . MR (mitral regurgitation)    moderate to severe, echo 07/2011  . Shortness of breath   . Stroke [facility] (Nathaniel White))    TIA's-left sided weakness  . Tobacco abuse    Family History  Problem Relation Age of Onset  . Hypertension Mother   . Varicose Veins Mother   . Diabetes Paternal Grandmother   . Cancer Paternal Grandfather   . CAD Paternal Uncle    Past Surgical History:  Procedure Laterality Date  . ABDOMINAL AORTOGRAM W/LOWER EXTREMITY Bilateral 07/21/2018   Procedure: ABDOMINAL AORTOGRAM W/LOWER EXTREMITY;  Surgeon: Nathaniel Sandy, White;   Location: [place] CV LAB;  Service: Cardiovascular;  Laterality: Bilateral;  . AV FISTULA PLACEMENT    . CARDIAC CATHETERIZATION     [facility]  . COLONOSCOPY W/ BIOPSIES AND POLYPECTOMY    . FISTULA SUPERFICIALIZATION Left 11/10/2013   Procedure: FISTULA PLICATION;  Surgeon: Nathaniel Mitchell, White;  Location: [facility];  Service: Vascular;  Laterality: Left;  . KIDNEY TRANSPLANT     2011 rejected kidney 2012 back on dialysis  . LEFT AND RIGHT HEART CATHETERIZATION WITH CORONARY ANGIOGRAM N/A 10/09/2013   Procedure: LEFT AND RIGHT HEART CATHETERIZATION WITH CORONARY ANGIOGRAM;  Surgeon: Burnell Blanks, White;  Location: [facility] CATH LAB;  Service: Cardiovascular;  Laterality: N/A;  . PERIPHERAL VASCULAR INTERVENTION Left 07/21/2018   Procedure: PERIPHERAL VASCULAR INTERVENTION;  Surgeon: Nathaniel Sandy, White;  Location: [place] CV LAB;  Service: Cardiovascular;  Laterality: Left;  Popliteal  . REVISON OF ARTERIOVENOUS FISTULA Left 08/26/2013   Procedure: EXCISION OF ERODED SKIN AND EXPLORATION OF MAIN LEFT UPPER ARM AV FISTULA;  Surgeon: Nathaniel Posner, White;  Location: [place];  Service: Vascular;  Laterality: Left;  . REVISON OF ARTERIOVENOUS FISTULA Left 02/05/2014   Procedure: REPAIR OF ARTERIOVENOUS FISTULA ANEURYSM;  Surgeon: Nathaniel Mitchell,  White;  Location: [place];  Service: Vascular;  Laterality: Left;  . SHUNTOGRAM N/A 11/05/2012   Procedure: Fistulogram;  Surgeon: Nathaniel Mitchell, White;  Location: [facility] CATH LAB;  Service: Cardiovascular;  Laterality: N/A;  . TEE WITHOUT CARDIOVERSION  08/17/2011   Procedure: TRANSESOPHAGEAL ECHOCARDIOGRAM (TEE);  Surgeon: Nathaniel Dresser, White;  Location: [facility] ENDOSCOPY;  Service: Cardiovascular;  Laterality: N/A;  . VIDEO ASSISTED THORACOSCOPY (VATS)/DECORTICATION  08/10/11    Short Social History:  Social History   Tobacco Use  . Smoking status: Former Smoker    Packs/day: 0.50    Years: 32.00    Pack years: 16.00    Types: Cigarettes    Quit  date: 10/13/2012    Years since quitting: 6.0  . Smokeless tobacco: Never Used  Substance Use Topics  . Alcohol use: No    Alcohol/week: 0.0 standard drinks    Allergies  Allergen Reactions  . Codeine Shortness Of Breath  . Dextromethorphan-Guaifenesin Other (See Comments)  . Iron Dextran Other (See Comments)  . Morphine And Related Other (See Comments)    Current Outpatient Medications  Medication Sig Dispense Refill  . acetaminophen (TYLENOL) 500 MG tablet Take 1 tablet (500 mg total) by mouth every 6 (six) hours as needed. 30 tablet 0  . albuterol (VENTOLIN HFA) 108 (90 Base) MCG/ACT inhaler Inhale 1-2 puffs into the lungs every 6 (six) hours as needed for wheezing or shortness of breath. 18 g 2  . aspirin EC 81 MG tablet Take 81 mg by mouth 3 (three) times a week.    . B Complex-C-Zn-Folic Acid (DIALYVITE 850 WITH ZINC) 0.8 MG TABS Take 1 tablet by mouth daily.    . clonazePAM (KLONOPIN) 1 MG tablet Take by mouth.    . clopidogrel (PLAVIX) 75 MG tablet Take 1 tablet (75 mg total) by mouth daily. 30 tablet 11  . Fluticasone-Salmeterol (ADVAIR) 250-50 MCG/DOSE AEPB Inhale 1 puff into the lungs 2 (two) times daily. 60 each 3  . FOSRENOL 1000 MG chewable tablet Chew 1,000 mg by mouth 3 (three) times daily with meals.   12  . glucose blood (ONE TOUCH ULTRA TEST) test strip     . hydroxypropyl methylcellulose / hypromellose (ISOPTO TEARS / GONIOVISC) 2.5 % ophthalmic solution Place 1 drop into both eyes as needed for dry eyes.    Nathaniel White loperamide (IMODIUM) 2 MG capsule Take 4 mg by mouth as needed for diarrhea or loose stools.    . montelukast (SINGULAIR) 10 MG tablet Take 1 tablet (10 mg total) by mouth at bedtime. 90 tablet 3  . rosuvastatin (CRESTOR) 10 MG tablet Take 1 tablet (10 mg total) by mouth at bedtime. 90 tablet 3  . SENSIPAR 30 MG tablet Take 30 mg by mouth 2 (two) times daily with a meal.     . silver sulfADIAZINE (SILVADENE) 1 % cream Apply topically daily to wound 50 g 0  .  tadalafil (CIALIS) 5 MG tablet Take 5 mg by mouth daily as needed for erectile dysfunction.    . traMADol (ULTRAM) 50 MG tablet Take 50 mg by mouth 2 (two) times daily. Take one tablet by mouth 2x a day as needed.    . VELPHORO 500 MG chewable tablet CRUSH OR CHEW AND SWALLOW 2 TABLETS 3 TIMES A DAY WITH MEALS     No current facility-administered medications for this visit.     Review of Systems  Constitutional:  Constitutional negative. HENT: HENT negative.  Eyes: Eyes negative.  Respiratory:  Respiratory negative.  Cardiovascular: Cardiovascular negative.  GI: Gastrointestinal negative.  Musculoskeletal: Musculoskeletal negative.  Skin: Positive for wound.  Neurological: Neurological negative. Hematologic: Hematologic/lymphatic negative.  Psychiatric: Psychiatric negative.        Objective:  Objective   Vitals:   10/24/18 1134  BP: 98/63  Pulse: 90  Resp: 20  Temp: (!) 97.5 F (36.4 C)  SpO2: 96%  Weight: 165 lb (74.8 kg)  Height: 5\' 9"  (1.753 m)   Body mass index is 24.37 kg/m.  Physical Exam HENT:     Nose: Nose normal.  Eyes:     Pupils: Pupils are equal, round, and reactive to light.  Cardiovascular:     Rate and Rhythm: Normal rate.     Comments: Strong left PT and AT signals Abdominal:     General: Abdomen is flat.     Palpations: Abdomen is soft.  Skin:    Capillary Refill: Capillary refill takes less than 2 seconds.     Comments: There is a 4 cm superficial wound posterior left heel with granulation tissue.  Left second toe is frankly gangrenous  Neurological:     Mental Status: He is alert.  Psychiatric:        Mood and Affect: Mood normal.        Thought Content: Thought content normal.        Assessment/Plan:     58 year old male status post left lower extremity revascularization for heel and left second toe ulceration.  The left heel appears to be progressing well under the care of Dr. Dellia Nims.  Left second toe now indicated for amputation  patient is agreeable we will get him scheduled next week.  Can continue aspirin Plavix throughout the procedure.     Nathaniel Sandy White [facility]

## 2018-10-25 DIAGNOSIS — Z992 Dependence on renal dialysis: Secondary | ICD-10-CM | POA: Diagnosis not present

## 2018-10-25 DIAGNOSIS — N186 End stage renal disease: Secondary | ICD-10-CM | POA: Diagnosis not present

## 2018-10-25 DIAGNOSIS — N2581 Secondary hyperparathyroidism of renal origin: Secondary | ICD-10-CM | POA: Diagnosis not present

## 2018-10-25 DIAGNOSIS — E119 Type 2 diabetes mellitus without complications: Secondary | ICD-10-CM | POA: Diagnosis not present

## 2018-10-25 DIAGNOSIS — D509 Iron deficiency anemia, unspecified: Secondary | ICD-10-CM | POA: Diagnosis not present

## 2018-10-27 DIAGNOSIS — L97422 Non-pressure chronic ulcer of left heel and midfoot with fat layer exposed: Secondary | ICD-10-CM | POA: Diagnosis not present

## 2018-10-27 DIAGNOSIS — E11621 Type 2 diabetes mellitus with foot ulcer: Secondary | ICD-10-CM | POA: Diagnosis not present

## 2018-10-27 DIAGNOSIS — Z4800 Encounter for change or removal of nonsurgical wound dressing: Secondary | ICD-10-CM | POA: Diagnosis not present

## 2018-10-27 DIAGNOSIS — E1122 Type 2 diabetes mellitus with diabetic chronic kidney disease: Secondary | ICD-10-CM | POA: Diagnosis not present

## 2018-10-27 DIAGNOSIS — E1151 Type 2 diabetes mellitus with diabetic peripheral angiopathy without gangrene: Secondary | ICD-10-CM | POA: Diagnosis not present

## 2018-10-27 DIAGNOSIS — I132 Hypertensive heart and chronic kidney disease with heart failure and with stage 5 chronic kidney disease, or end stage renal disease: Secondary | ICD-10-CM | POA: Diagnosis not present

## 2018-10-28 DIAGNOSIS — D509 Iron deficiency anemia, unspecified: Secondary | ICD-10-CM | POA: Diagnosis not present

## 2018-10-28 DIAGNOSIS — Z992 Dependence on renal dialysis: Secondary | ICD-10-CM | POA: Diagnosis not present

## 2018-10-28 DIAGNOSIS — N186 End stage renal disease: Secondary | ICD-10-CM | POA: Diagnosis not present

## 2018-10-28 DIAGNOSIS — E119 Type 2 diabetes mellitus without complications: Secondary | ICD-10-CM | POA: Diagnosis not present

## 2018-10-28 DIAGNOSIS — N2581 Secondary hyperparathyroidism of renal origin: Secondary | ICD-10-CM | POA: Diagnosis not present

## 2018-10-30 DIAGNOSIS — E1122 Type 2 diabetes mellitus with diabetic chronic kidney disease: Secondary | ICD-10-CM | POA: Diagnosis not present

## 2018-10-30 DIAGNOSIS — I69354 Hemiplegia and hemiparesis following cerebral infarction affecting left non-dominant side: Secondary | ICD-10-CM | POA: Diagnosis not present

## 2018-10-30 DIAGNOSIS — L97522 Non-pressure chronic ulcer of other part of left foot with fat layer exposed: Secondary | ICD-10-CM | POA: Diagnosis not present

## 2018-10-30 DIAGNOSIS — Z9981 Dependence on supplemental oxygen: Secondary | ICD-10-CM | POA: Diagnosis not present

## 2018-10-30 DIAGNOSIS — D509 Iron deficiency anemia, unspecified: Secondary | ICD-10-CM | POA: Diagnosis not present

## 2018-10-30 DIAGNOSIS — N186 End stage renal disease: Secondary | ICD-10-CM | POA: Diagnosis not present

## 2018-10-30 DIAGNOSIS — Z87891 Personal history of nicotine dependence: Secondary | ICD-10-CM | POA: Diagnosis not present

## 2018-10-30 DIAGNOSIS — L97422 Non-pressure chronic ulcer of left heel and midfoot with fat layer exposed: Secondary | ICD-10-CM | POA: Diagnosis not present

## 2018-10-30 DIAGNOSIS — E119 Type 2 diabetes mellitus without complications: Secondary | ICD-10-CM | POA: Diagnosis not present

## 2018-10-30 DIAGNOSIS — I420 Dilated cardiomyopathy: Secondary | ICD-10-CM | POA: Diagnosis not present

## 2018-10-30 DIAGNOSIS — E11621 Type 2 diabetes mellitus with foot ulcer: Secondary | ICD-10-CM | POA: Diagnosis not present

## 2018-10-30 DIAGNOSIS — E1151 Type 2 diabetes mellitus with diabetic peripheral angiopathy without gangrene: Secondary | ICD-10-CM | POA: Diagnosis not present

## 2018-10-30 DIAGNOSIS — J449 Chronic obstructive pulmonary disease, unspecified: Secondary | ICD-10-CM | POA: Diagnosis not present

## 2018-10-30 DIAGNOSIS — Z992 Dependence on renal dialysis: Secondary | ICD-10-CM | POA: Diagnosis not present

## 2018-10-30 DIAGNOSIS — I509 Heart failure, unspecified: Secondary | ICD-10-CM | POA: Diagnosis not present

## 2018-10-30 DIAGNOSIS — Z4800 Encounter for change or removal of nonsurgical wound dressing: Secondary | ICD-10-CM | POA: Diagnosis not present

## 2018-10-30 DIAGNOSIS — N2581 Secondary hyperparathyroidism of renal origin: Secondary | ICD-10-CM | POA: Diagnosis not present

## 2018-10-30 DIAGNOSIS — I132 Hypertensive heart and chronic kidney disease with heart failure and with stage 5 chronic kidney disease, or end stage renal disease: Secondary | ICD-10-CM | POA: Diagnosis not present

## 2018-10-31 ENCOUNTER — Encounter (HOSPITAL_BASED_OUTPATIENT_CLINIC_OR_DEPARTMENT_OTHER): Payer: Medicare Other | Admitting: Internal Medicine

## 2018-11-01 DIAGNOSIS — N186 End stage renal disease: Secondary | ICD-10-CM | POA: Diagnosis not present

## 2018-11-01 DIAGNOSIS — N2581 Secondary hyperparathyroidism of renal origin: Secondary | ICD-10-CM | POA: Diagnosis not present

## 2018-11-01 DIAGNOSIS — D509 Iron deficiency anemia, unspecified: Secondary | ICD-10-CM | POA: Diagnosis not present

## 2018-11-01 DIAGNOSIS — E119 Type 2 diabetes mellitus without complications: Secondary | ICD-10-CM | POA: Diagnosis not present

## 2018-11-01 DIAGNOSIS — Z992 Dependence on renal dialysis: Secondary | ICD-10-CM | POA: Diagnosis not present

## 2018-11-03 ENCOUNTER — Other Ambulatory Visit (HOSPITAL_COMMUNITY)
Admission: RE | Admit: 2018-11-03 | Discharge: 2018-11-03 | Disposition: A | Discharge status: Home / Self Care | Payer: Medicare Other | Source: Ambulatory Visit | Attending: Vascular Surgery | Admitting: Vascular Surgery

## 2018-11-03 DIAGNOSIS — Z01812 Encounter for preprocedural laboratory examination: Secondary | ICD-10-CM | POA: Diagnosis not present

## 2018-11-03 DIAGNOSIS — Z20828 Contact with and (suspected) exposure to other viral communicable diseases: Secondary | ICD-10-CM | POA: Diagnosis not present

## 2018-11-03 LAB — SARS CORONAVIRUS 2 (TAT 6-24 HRS): SARS Coronavirus 2: NEGATIVE

## 2018-11-03 MED FILL — ALBUTEROL SULFATE HFA 108 (: 108 (90 BAS | 25 days supply | Qty: 18 | Fill #2

## 2018-11-03 MED FILL — ADVAIR 250/50 DISKUS: 250-50 | 30 days supply | Qty: 60 | Fill #2

## 2018-11-04 ENCOUNTER — Encounter (HOSPITAL_COMMUNITY): Payer: Self-pay | Admitting: *Deleted

## 2018-11-04 DIAGNOSIS — N186 End stage renal disease: Secondary | ICD-10-CM | POA: Diagnosis not present

## 2018-11-04 DIAGNOSIS — E119 Type 2 diabetes mellitus without complications: Secondary | ICD-10-CM | POA: Diagnosis not present

## 2018-11-04 DIAGNOSIS — D509 Iron deficiency anemia, unspecified: Secondary | ICD-10-CM | POA: Diagnosis not present

## 2018-11-04 DIAGNOSIS — Z992 Dependence on renal dialysis: Secondary | ICD-10-CM | POA: Diagnosis not present

## 2018-11-04 DIAGNOSIS — N2581 Secondary hyperparathyroidism of renal origin: Secondary | ICD-10-CM | POA: Diagnosis not present

## 2018-11-04 NOTE — Progress Notes (Signed)
Mr Bargar denies chest pain or shortness of breath. Mr. Wanat states that he does not have diabetes, since he he started on dialysis, he does not have a CBG machine.  Pharmacy was not able to get in touch with patient, I reviewed medications with patient.  Patient is not taking Plavix and has not for a while, he does not have any at home.  Mr Dufresne also is not taking Singular or Lipitor.  Patient had a cardiac work up in 2015, Echo showed EF 40- 345; Moderated  Mitral Valve regurgitation.  Mr kyrese gartman that he has an appoint with cardiologist- 10/29- I checked, he has an appointment with Dr Ina Kick. I informed Dr. Marcie Bal of my findings and patient not taking medications.   I sent Dr Donzetta Matters a staff message about patient not taking medications,  Mr. Giangregorio was suppose to continue ASA and Plavix; he is taking ASA

## 2018-11-04 NOTE — Anesthesia Preprocedure Evaluation (Addendum)
Anesthesia Evaluation  Patient identified by MRN, date of birth, ID band Patient awake    Reviewed: Allergy & Precautions, NPO status , Patient's Chart, lab work & pertinent test results, reviewed documented beta blocker date and time   History of Anesthesia Complications (+) PROLONGED EMERGENCE and history of anesthetic complications  Airway Mallampati: II  TM Distance: >3 FB     Dental  (+) Teeth Intact, Loose, Dental Advisory Given,    Pulmonary shortness of breath, with exertion and lying, asthma , COPD,  COPD inhaler, former smoker,  Quit smoking 2014, 7.5 pack year history    (-) wheezing      Cardiovascular hypertension, Pt. on medications + Peripheral Vascular Disease and +CHF  + Valvular Problems/Murmurs MR  Rhythm:Regular Rate:Normal  Last echo 2015: - Left ventricle: The cavity size was normal. There was moderate  concentric hypertrophy. Systolic function was mildly to  moderately reduced. The estimated ejection fraction was in the  range of 40% to 45%. There is hypokinesis of the inferoseptal  myocardium.  - Mitral valve: Moderately calcified annulus. There was moderate  regurgitation.  - Left atrium: The atrium was moderately dilated.  - Right ventricle: The cavity size was mildly dilated. Wall  thickness was normal.  - Right atrium: The atrium was mildly dilated.  - Pulmonary arteries: PA peak pressure: 58 mm Hg (S).    Neuro/Psych Anxiety Left sided weakness CVA, Residual Symptoms    GI/Hepatic Neg liver ROS, Hx esophageal varices in 2010   Endo/Other  diabetes, Type 2  Renal/GU Dialysis and ESRFRenal diseaseM/W/F HD schedule  negative genitourinary   Musculoskeletal negative musculoskeletal ROS (+)   Abdominal (+)  Abdomen: soft. Bowel sounds: normal.  Peds  Hematology  (+) anemia ,   Anesthesia Other Findings ICU admit after one previous anesthetic- slow awakening, prolonged  intubation, LMA required intubation  Reproductive/Obstetrics negative OB ROS                            Anesthesia Physical  Anesthesia Plan  ASA: III  Anesthesia Plan: MAC   Post-op Pain Management:    Induction: Intravenous  PONV Risk Score and Plan: 1 and Propofol infusion and TIVA  Airway Management Planned: Natural Airway and Nasal Cannula  Additional Equipment: None  Intra-op Plan:   Post-operative Plan:   Informed Consent: I have reviewed the patients History and Physical, chart, labs and discussed the procedure including the risks, benefits and alternatives for the proposed anesthesia with the patient or authorized representative who has indicated his/her understanding and acceptance.       Plan Discussed with: CRNA  Anesthesia Plan Comments: (Potassium checked multiple times- 6.4. Advised surgeon who wished to proceed with case under local, as patient has little to no sensation in the foot to begin with. No sedation will be given, but will monitor throughout. )       Anesthesia Quick Evaluation

## 2018-11-05 ENCOUNTER — Encounter (HOSPITAL_COMMUNITY)
Admission: RE | Disposition: A | Discharge status: Home / Self Care | Payer: Self-pay | Source: Home / Self Care | Attending: Vascular Surgery

## 2018-11-05 ENCOUNTER — Other Ambulatory Visit: Payer: Self-pay

## 2018-11-05 ENCOUNTER — Other Ambulatory Visit: Payer: Self-pay | Admitting: Vascular Surgery

## 2018-11-05 ENCOUNTER — Ambulatory Visit (HOSPITAL_COMMUNITY): Payer: Medicare Other | Admitting: Certified Registered Nurse Anesthetist

## 2018-11-05 ENCOUNTER — Encounter (HOSPITAL_COMMUNITY): Payer: Self-pay

## 2018-11-05 ENCOUNTER — Ambulatory Visit (HOSPITAL_COMMUNITY)
Admission: RE | Admit: 2018-11-05 | Discharge: 2018-11-05 | Disposition: A | Discharge status: Home / Self Care | Payer: Medicare Other | Attending: Vascular Surgery | Admitting: Vascular Surgery

## 2018-11-05 DIAGNOSIS — Z7951 Long term (current) use of inhaled steroids: Secondary | ICD-10-CM | POA: Diagnosis not present

## 2018-11-05 DIAGNOSIS — I69354 Hemiplegia and hemiparesis following cerebral infarction affecting left non-dominant side: Secondary | ICD-10-CM | POA: Insufficient documentation

## 2018-11-05 DIAGNOSIS — Z79899 Other long term (current) drug therapy: Secondary | ICD-10-CM | POA: Insufficient documentation

## 2018-11-05 DIAGNOSIS — E11621 Type 2 diabetes mellitus with foot ulcer: Secondary | ICD-10-CM | POA: Diagnosis not present

## 2018-11-05 DIAGNOSIS — I132 Hypertensive heart and chronic kidney disease with heart failure and with stage 5 chronic kidney disease, or end stage renal disease: Secondary | ICD-10-CM | POA: Insufficient documentation

## 2018-11-05 DIAGNOSIS — Z7902 Long term (current) use of antithrombotics/antiplatelets: Secondary | ICD-10-CM | POA: Insufficient documentation

## 2018-11-05 DIAGNOSIS — E1122 Type 2 diabetes mellitus with diabetic chronic kidney disease: Secondary | ICD-10-CM | POA: Insufficient documentation

## 2018-11-05 DIAGNOSIS — Z992 Dependence on renal dialysis: Secondary | ICD-10-CM | POA: Insufficient documentation

## 2018-11-05 DIAGNOSIS — I509 Heart failure, unspecified: Secondary | ICD-10-CM | POA: Diagnosis not present

## 2018-11-05 DIAGNOSIS — J449 Chronic obstructive pulmonary disease, unspecified: Secondary | ICD-10-CM | POA: Diagnosis not present

## 2018-11-05 DIAGNOSIS — E1151 Type 2 diabetes mellitus with diabetic peripheral angiopathy without gangrene: Secondary | ICD-10-CM | POA: Insufficient documentation

## 2018-11-05 DIAGNOSIS — Z7982 Long term (current) use of aspirin: Secondary | ICD-10-CM | POA: Insufficient documentation

## 2018-11-05 DIAGNOSIS — E1152 Type 2 diabetes mellitus with diabetic peripheral angiopathy with gangrene: Secondary | ICD-10-CM | POA: Insufficient documentation

## 2018-11-05 DIAGNOSIS — I96 Gangrene, not elsewhere classified: Secondary | ICD-10-CM | POA: Insufficient documentation

## 2018-11-05 DIAGNOSIS — N186 End stage renal disease: Secondary | ICD-10-CM | POA: Insufficient documentation

## 2018-11-05 DIAGNOSIS — Z87891 Personal history of nicotine dependence: Secondary | ICD-10-CM | POA: Insufficient documentation

## 2018-11-05 DIAGNOSIS — L97429 Non-pressure chronic ulcer of left heel and midfoot with unspecified severity: Secondary | ICD-10-CM | POA: Diagnosis not present

## 2018-11-05 DIAGNOSIS — F419 Anxiety disorder, unspecified: Secondary | ICD-10-CM | POA: Diagnosis not present

## 2018-11-05 HISTORY — PX: AMPUTATION: SHX166

## 2018-11-05 HISTORY — DX: Peripheral vascular disease, unspecified: I73.9

## 2018-11-05 LAB — POCT I-STAT, CHEM 8
BUN: 55 mg/dL — ABNORMAL HIGH (ref 6–20)
Calcium, Ion: 0.93 mmol/L — ABNORMAL LOW (ref 1.15–1.40)
Chloride: 101 mmol/L (ref 98–111)
Creatinine, Ser: 6.3 mg/dL — ABNORMAL HIGH (ref 0.61–1.24)
Glucose, Bld: 83 mg/dL (ref 70–99)
HCT: 43 % (ref 39.0–52.0)
Hemoglobin: 14.6 g/dL (ref 13.0–17.0)
Potassium: 6.5 mmol/L (ref 3.5–5.1)
Sodium: 137 mmol/L (ref 135–145)
TCO2: 30 mmol/L (ref 22–32)

## 2018-11-05 LAB — GLUCOSE, CAPILLARY
Glucose-Capillary: 83 mg/dL (ref 70–99)
Glucose-Capillary: 78 mg/dL (ref 70–99)

## 2018-11-05 SURGERY — AMPUTATION DIGIT
Anesthesia: Monitor Anesthesia Care | Laterality: Left

## 2018-11-05 MED ORDER — FENTANYL CITRATE (PF) 250 MCG/5ML IJ SOLN
INTRAMUSCULAR | Status: AC
  Filled 2018-11-05: qty 5

## 2018-11-05 MED ORDER — CEFAZOLIN SODIUM-DEXTROSE 2-4 GM/100ML-% IV SOLN
2.0000 g | INTRAVENOUS | Status: AC
  Administered 2018-11-05: 2 g via INTRAVENOUS
  Filled 2018-11-05: qty 100

## 2018-11-05 MED ORDER — MIDAZOLAM HCL 2 MG/2ML IJ SOLN
INTRAMUSCULAR | Status: AC
  Filled 2018-11-05: qty 2

## 2018-11-05 MED ORDER — ONDANSETRON HCL 4 MG/2ML IJ SOLN: 4.0000 mg | Freq: Once | INTRAMUSCULAR | Status: DC | PRN

## 2018-11-05 MED ORDER — LIDOCAINE HCL (PF) 1 % IJ SOLN
INTRAMUSCULAR | Status: AC
  Filled 2018-11-05: qty 30

## 2018-11-05 MED ORDER — OXYCODONE-ACETAMINOPHEN 5-325 MG PO TABS: 1.0000 | ORAL_TABLET | ORAL | 0 refills | Status: DC | PRN

## 2018-11-05 MED ORDER — CHLORHEXIDINE GLUCONATE 4 % EX LIQD: 60.0000 mL | Freq: Once | CUTANEOUS | Status: DC

## 2018-11-05 MED ORDER — 0.9 % SODIUM CHLORIDE (POUR BTL) OPTIME
TOPICAL | Status: DC | PRN
  Administered 2018-11-05: 1000 mL

## 2018-11-05 MED ORDER — ACETAMINOPHEN 500 MG PO TABS
1000.0000 mg | ORAL_TABLET | Freq: Once | ORAL | Status: AC
  Administered 2018-11-05: 1000 mg via ORAL
  Filled 2018-11-05: qty 2

## 2018-11-05 MED ORDER — SODIUM CHLORIDE 0.9 % IV SOLN
INTRAVENOUS | Status: DC
  Administered 2018-11-05: 07:00:00 via INTRAVENOUS

## 2018-11-05 MED ORDER — PROPOFOL 10 MG/ML IV BOLUS
INTRAVENOUS | Status: AC
  Filled 2018-11-05: qty 40

## 2018-11-05 MED ORDER — FENTANYL CITRATE (PF) 100 MCG/2ML IJ SOLN: 25.0000 ug | INTRAMUSCULAR | Status: DC | PRN

## 2018-11-05 MED ORDER — LIDOCAINE HCL (PF) 1 % IJ SOLN
INTRAMUSCULAR | Status: DC | PRN
  Administered 2018-11-05: 30 mL

## 2018-11-05 SURGICAL SUPPLY — 34 items
BLADE AVERAGE 25X9 (BLADE) IMPLANT
BLADE SAW SGTL 81X20 HD (BLADE) IMPLANT
BNDG ELASTIC 4X5.8 VLCR STR LF (GAUZE/BANDAGES/DRESSINGS) ×2 IMPLANT
BNDG ELASTIC 6X15 VLCR STRL LF (GAUZE/BANDAGES/DRESSINGS) ×2 IMPLANT
BNDG GAUZE ELAST 4 BULKY (GAUZE/BANDAGES/DRESSINGS) ×2 IMPLANT
CANISTER SUCT 3000ML PPV (MISCELLANEOUS) ×2 IMPLANT
COVER SURGICAL LIGHT HANDLE (MISCELLANEOUS) ×2 IMPLANT
COVER WAND RF STERILE (DRAPES) IMPLANT
DRAPE EXTREMITY T 121X128X90 (DISPOSABLE) ×2 IMPLANT
DRAPE HALF SHEET 40X57 (DRAPES) ×2 IMPLANT
DRSG EMULSION OIL 3X3 NADH (GAUZE/BANDAGES/DRESSINGS) ×2 IMPLANT
ELECT REM PT RETURN 9FT ADLT (ELECTROSURGICAL) ×2
ELECTRODE REM PT RTRN 9FT ADLT (ELECTROSURGICAL) ×1 IMPLANT
GAUZE SPONGE 4X4 12PLY STRL (GAUZE/BANDAGES/DRESSINGS) ×2 IMPLANT
GAUZE SPONGE 4X4 12PLY STRL LF (GAUZE/BANDAGES/DRESSINGS) ×2 IMPLANT
GLOVE BIO SURGEON STRL SZ7.5 (GLOVE) ×2 IMPLANT
GLOVE BIOGEL PI IND STRL 6.5 (GLOVE) ×1 IMPLANT
GLOVE BIOGEL PI INDICATOR 6.5 (GLOVE) ×1
GLOVE ECLIPSE 6.5 STRL STRAW (GLOVE) ×2 IMPLANT
GOWN STRL REUS W/ TWL LRG LVL3 (GOWN DISPOSABLE) ×2 IMPLANT
GOWN STRL REUS W/ TWL XL LVL3 (GOWN DISPOSABLE) ×1 IMPLANT
GOWN STRL REUS W/TWL LRG LVL3 (GOWN DISPOSABLE) ×2
GOWN STRL REUS W/TWL XL LVL3 (GOWN DISPOSABLE) ×1
KIT BASIN OR (CUSTOM PROCEDURE TRAY) ×2 IMPLANT
KIT TURNOVER KIT B (KITS) ×2 IMPLANT
NEEDLE HYPO 25GX1X1/2 BEV (NEEDLE) ×2 IMPLANT
NS IRRIG 1000ML POUR BTL (IV SOLUTION) ×2 IMPLANT
PACK GENERAL/GYN (CUSTOM PROCEDURE TRAY) ×2 IMPLANT
PAD ARMBOARD 7.5X6 YLW CONV (MISCELLANEOUS) ×4 IMPLANT
SUT ETHILON 3 0 PS 1 (SUTURE) ×4 IMPLANT
SYR CONTROL 10ML LL (SYRINGE) ×2 IMPLANT
TOWEL GREEN STERILE (TOWEL DISPOSABLE) ×4 IMPLANT
UNDERPAD 30X30 (UNDERPADS AND DIAPERS) ×2 IMPLANT
WATER STERILE IRR 1000ML POUR (IV SOLUTION) ×2 IMPLANT

## 2018-11-05 NOTE — Anesthesia Procedure Notes (Signed)
Date/Time: 11/05/2018 7:35 AM Performed by: Alain Marion, CRNA Pre-anesthesia Checklist: Patient identified, Emergency Drugs available, Suction available, Patient being monitored and Timeout performed Patient Re-evaluated:Patient Re-evaluated prior to induction Oxygen Delivery Method: Simple face mask Preoxygenation: Pre-oxygenation with 100% oxygen Placement Confirmation: positive ETCO2

## 2018-11-05 NOTE — Progress Notes (Signed)
Orthopedic Tech Progress Note Patient Details:  Nathaniel White 1960-04-09 [id number]  Ortho Devices Type of Ortho Device: Postop shoe/boot Ortho Device/Splint Interventions: Application   Post Interventions Patient Tolerated: Well Instructions Provided: Care of device   [name] 11/05/2018, 10:03 AM

## 2018-11-05 NOTE — Transfer of Care (Signed)
Immediate Anesthesia Transfer of Care Note  Patient: Nathaniel White  Procedure(s) Performed: AMPUTATION SECOND TOE LEFT FOOT (Left )  Patient Location: PACU  Anesthesia Type:Procedure done under local anesthesia given by surgeon, Dr. Donzetta Matters.  No anesthesia given.  Level of Consciousness: awake, alert  and oriented  Airway & Oxygen Therapy: Patient Spontanous Breathing and Patient connected to face mask oxygen  Post-op Assessment: Report given to RN and Post -op Vital signs reviewed and stable  Post vital signs: Reviewed and stable  Last Vitals:  Vitals Value Taken Time  BP 108/75 11/05/18 0818  Temp 36.1 C 11/05/18 0818  Pulse 92 11/05/18 0820  Resp 20 11/05/18 0820  SpO2 98 % 11/05/18 0820  Vitals shown include unvalidated device data.  Last Pain:  Vitals:   11/05/18 0648  PainSc: 0-No pain         Complications: No apparent anesthesia complications

## 2018-11-05 NOTE — Anesthesia Postprocedure Evaluation (Signed)
Anesthesia Post Note  Patient: Nathaniel White  Procedure(s) Performed: AMPUTATION SECOND TOE LEFT FOOT (Left )     Patient location during evaluation: PACU Anesthesia Type: MAC Level of consciousness: awake and alert Pain management: pain level controlled Vital Signs Assessment: post-procedure vital signs reviewed and stable Respiratory status: spontaneous breathing, nonlabored ventilation and respiratory function stable Cardiovascular status: blood pressure returned to baseline and stable Postop Assessment: no apparent nausea or vomiting Anesthetic complications: no    Last Vitals:  Vitals:   11/05/18 0632 11/05/18 0818  BP: 106/72 108/75  Pulse: 81 95  Resp: 18 20  Temp: 36.9 C (!) 36.1 C  SpO2: 94% 99%    Last Pain:  Vitals:   11/05/18 0818  PainSc: 0-No pain                 Jarome Matin [place] Cantero

## 2018-11-05 NOTE — Op Note (Signed)
    Patient name: Nathaniel White MRN: [id number] DOB: 09-07-1960 Sex: male  11/05/2018 Pre-operative Diagnosis: Chronic left second toe Post-operative diagnosis:  Same Surgeon:  Eda Paschal. Donzetta Matters, MD Procedure Performed:   Left second toe amputation  Indications: 58 year old male is recently undergone left lower extremity revascularization with popliteal artery stent.  He has a gangrenous left second toe as well as a heel ulcer that has been followed with Dr. Dellia Nims.  He is indicated for second toe amputation.  Findings: Left second toe was amputated there was bleeding in the wound bed to suggest healing possibility.  Patient will remain high risk for major amputation given that surrounding toes are not entirely healthy however there are no gangrenous changes at this time.   Procedure:  The patient was identified in the holding area and taken to the operating room where is placed supine on the operating table.  Given underlying medical conditions and elevated potassium no anesthesia was given except monitored care.  Timeout was called antibiotics were given.  Percent lidocaine with epinephrine was used to anesthetize the area around the second toe and tennis racquet type incision was used.  We dissected back to the metatarsal phalangeal joint removed all the bone.  We obtained stasis with cautery.  We thoroughly irrigated the wound and closed the skin with interrupted 3-0 nylon sutures.  He tolerated procedure well immediate complication.  All counts were correct at completion.  EBL: 10 cc   Arline Ketter C. Donzetta Matters, MD [facility] [place] Office: [phone] Pager: [phone]

## 2018-11-05 NOTE — H&P (Signed)
   History and Physical Update  The patient was interviewed and re-examined.  The patient's previous History and Physical has been reviewed and is unchanged from recent office visit. Plan to remove left second toe in OR today. Has persistent heel wound. Will return to wearing his boot at home.   Novice Vrba C. Donzetta Matters, MD [facility] [place] Office: [phone] Pager: [phone]   11/05/2018, 7:21 AM

## 2018-11-05 NOTE — Progress Notes (Signed)
Orthopedic Tech Progress Note Patient Details:  Nathaniel White Sep 24, 1960 [id number]  Ortho Devices Type of Ortho Device: Postop shoe/boot Ortho Device/Splint Interventions: Application   Post Interventions Patient Tolerated: Well Instructions Provided: Care of device   [name] 11/05/2018, 10:03 AM

## 2018-11-06 ENCOUNTER — Ambulatory Visit: Payer: Medicare Other | Admitting: Critical Care Medicine

## 2018-11-06 ENCOUNTER — Encounter (HOSPITAL_COMMUNITY): Payer: Self-pay | Admitting: Vascular Surgery

## 2018-11-06 DIAGNOSIS — N186 End stage renal disease: Secondary | ICD-10-CM | POA: Diagnosis not present

## 2018-11-06 DIAGNOSIS — E119 Type 2 diabetes mellitus without complications: Secondary | ICD-10-CM | POA: Diagnosis not present

## 2018-11-06 DIAGNOSIS — N2581 Secondary hyperparathyroidism of renal origin: Secondary | ICD-10-CM | POA: Diagnosis not present

## 2018-11-06 DIAGNOSIS — D509 Iron deficiency anemia, unspecified: Secondary | ICD-10-CM | POA: Diagnosis not present

## 2018-11-06 DIAGNOSIS — Z992 Dependence on renal dialysis: Secondary | ICD-10-CM | POA: Diagnosis not present

## 2018-11-06 LAB — POCT I-STAT, CHEM 8
BUN: 57 mg/dL — ABNORMAL HIGH (ref 6–20)
Calcium, Ion: 0.95 mmol/L — ABNORMAL LOW (ref 1.15–1.40)
Chloride: 100 mmol/L (ref 98–111)
Creatinine, Ser: 6 mg/dL — ABNORMAL HIGH (ref 0.61–1.24)
Glucose, Bld: 82 mg/dL (ref 70–99)
HCT: 44 % (ref 39.0–52.0)
Hemoglobin: 15 g/dL (ref 13.0–17.0)
Potassium: 6.4 mmol/L (ref 3.5–5.1)
Sodium: 138 mmol/L (ref 135–145)
TCO2: 30 mmol/L (ref 22–32)

## 2018-11-08 DIAGNOSIS — E119 Type 2 diabetes mellitus without complications: Secondary | ICD-10-CM | POA: Diagnosis not present

## 2018-11-08 DIAGNOSIS — N2581 Secondary hyperparathyroidism of renal origin: Secondary | ICD-10-CM | POA: Diagnosis not present

## 2018-11-08 DIAGNOSIS — Z992 Dependence on renal dialysis: Secondary | ICD-10-CM | POA: Diagnosis not present

## 2018-11-08 DIAGNOSIS — N186 End stage renal disease: Secondary | ICD-10-CM | POA: Diagnosis not present

## 2018-11-08 DIAGNOSIS — D509 Iron deficiency anemia, unspecified: Secondary | ICD-10-CM | POA: Diagnosis not present

## 2018-11-09 DIAGNOSIS — Z992 Dependence on renal dialysis: Secondary | ICD-10-CM | POA: Diagnosis not present

## 2018-11-09 DIAGNOSIS — T8612 Kidney transplant failure: Secondary | ICD-10-CM | POA: Diagnosis not present

## 2018-11-09 DIAGNOSIS — N186 End stage renal disease: Secondary | ICD-10-CM | POA: Diagnosis not present

## 2018-11-11 DIAGNOSIS — N186 End stage renal disease: Secondary | ICD-10-CM | POA: Diagnosis not present

## 2018-11-11 DIAGNOSIS — N2581 Secondary hyperparathyroidism of renal origin: Secondary | ICD-10-CM | POA: Diagnosis not present

## 2018-11-11 DIAGNOSIS — Z992 Dependence on renal dialysis: Secondary | ICD-10-CM | POA: Diagnosis not present

## 2018-11-11 DIAGNOSIS — E119 Type 2 diabetes mellitus without complications: Secondary | ICD-10-CM | POA: Diagnosis not present

## 2018-11-12 DIAGNOSIS — E1151 Type 2 diabetes mellitus with diabetic peripheral angiopathy without gangrene: Secondary | ICD-10-CM | POA: Diagnosis not present

## 2018-11-12 DIAGNOSIS — L97422 Non-pressure chronic ulcer of left heel and midfoot with fat layer exposed: Secondary | ICD-10-CM | POA: Diagnosis not present

## 2018-11-12 DIAGNOSIS — E1122 Type 2 diabetes mellitus with diabetic chronic kidney disease: Secondary | ICD-10-CM | POA: Diagnosis not present

## 2018-11-12 DIAGNOSIS — L97522 Non-pressure chronic ulcer of other part of left foot with fat layer exposed: Secondary | ICD-10-CM | POA: Diagnosis not present

## 2018-11-12 DIAGNOSIS — Z4800 Encounter for change or removal of nonsurgical wound dressing: Secondary | ICD-10-CM | POA: Diagnosis not present

## 2018-11-12 DIAGNOSIS — E11621 Type 2 diabetes mellitus with foot ulcer: Secondary | ICD-10-CM | POA: Diagnosis not present

## 2018-11-13 DIAGNOSIS — Z992 Dependence on renal dialysis: Secondary | ICD-10-CM | POA: Diagnosis not present

## 2018-11-13 DIAGNOSIS — N2581 Secondary hyperparathyroidism of renal origin: Secondary | ICD-10-CM | POA: Diagnosis not present

## 2018-11-13 DIAGNOSIS — N186 End stage renal disease: Secondary | ICD-10-CM | POA: Diagnosis not present

## 2018-11-13 DIAGNOSIS — E119 Type 2 diabetes mellitus without complications: Secondary | ICD-10-CM | POA: Diagnosis not present

## 2018-11-15 DIAGNOSIS — Z992 Dependence on renal dialysis: Secondary | ICD-10-CM | POA: Diagnosis not present

## 2018-11-15 DIAGNOSIS — E119 Type 2 diabetes mellitus without complications: Secondary | ICD-10-CM | POA: Diagnosis not present

## 2018-11-15 DIAGNOSIS — N186 End stage renal disease: Secondary | ICD-10-CM | POA: Diagnosis not present

## 2018-11-15 DIAGNOSIS — N2581 Secondary hyperparathyroidism of renal origin: Secondary | ICD-10-CM | POA: Diagnosis not present

## 2018-11-17 ENCOUNTER — Telehealth: Payer: Self-pay | Admitting: Nurse Practitioner

## 2018-11-17 ENCOUNTER — Encounter (HOSPITAL_BASED_OUTPATIENT_CLINIC_OR_DEPARTMENT_OTHER): Payer: Medicare Other | Attending: Internal Medicine | Admitting: Internal Medicine

## 2018-11-17 ENCOUNTER — Other Ambulatory Visit: Payer: Self-pay

## 2018-11-17 DIAGNOSIS — E11621 Type 2 diabetes mellitus with foot ulcer: Secondary | ICD-10-CM | POA: Insufficient documentation

## 2018-11-17 DIAGNOSIS — I509 Heart failure, unspecified: Secondary | ICD-10-CM | POA: Insufficient documentation

## 2018-11-17 DIAGNOSIS — Z992 Dependence on renal dialysis: Secondary | ICD-10-CM | POA: Insufficient documentation

## 2018-11-17 DIAGNOSIS — N186 End stage renal disease: Secondary | ICD-10-CM | POA: Diagnosis not present

## 2018-11-17 DIAGNOSIS — J449 Chronic obstructive pulmonary disease, unspecified: Secondary | ICD-10-CM | POA: Diagnosis not present

## 2018-11-17 DIAGNOSIS — Z7982 Long term (current) use of aspirin: Secondary | ICD-10-CM | POA: Insufficient documentation

## 2018-11-17 DIAGNOSIS — E1151 Type 2 diabetes mellitus with diabetic peripheral angiopathy without gangrene: Secondary | ICD-10-CM | POA: Diagnosis not present

## 2018-11-17 DIAGNOSIS — E1122 Type 2 diabetes mellitus with diabetic chronic kidney disease: Secondary | ICD-10-CM | POA: Insufficient documentation

## 2018-11-17 DIAGNOSIS — L97221 Non-pressure chronic ulcer of left calf limited to breakdown of skin: Secondary | ICD-10-CM | POA: Insufficient documentation

## 2018-11-17 DIAGNOSIS — I87333 Chronic venous hypertension (idiopathic) with ulcer and inflammation of bilateral lower extremity: Secondary | ICD-10-CM | POA: Insufficient documentation

## 2018-11-17 DIAGNOSIS — Z7902 Long term (current) use of antithrombotics/antiplatelets: Secondary | ICD-10-CM | POA: Insufficient documentation

## 2018-11-17 DIAGNOSIS — L97423 Non-pressure chronic ulcer of left heel and midfoot with necrosis of muscle: Secondary | ICD-10-CM | POA: Diagnosis not present

## 2018-11-17 DIAGNOSIS — L97422 Non-pressure chronic ulcer of left heel and midfoot with fat layer exposed: Secondary | ICD-10-CM | POA: Diagnosis not present

## 2018-11-17 DIAGNOSIS — I132 Hypertensive heart and chronic kidney disease with heart failure and with stage 5 chronic kidney disease, or end stage renal disease: Secondary | ICD-10-CM | POA: Diagnosis not present

## 2018-11-17 NOTE — Progress Notes (Signed)
Nathaniel White ([id number]) Visit Report for 11/17/2018 Debridement Details Patient Name: Date of Service: Nathaniel White, Nathaniel White 11/17/2018 9:45 AM Medical Record [id number] Patient Account Number: [id number] Date of Birth/Sex: Treating RN: [birth year] (58 y.o. Nathaniel White Primary Care Provider: Geryl White Other Clinician: Referring Provider: Treating Provider/Extender:Nathaniel White, Nathaniel White, Nathaniel White in Treatment: 20 Debridement Performed for Wound #1 Left,Lateral Calcaneus Assessment: Performed By: Physician Nathaniel White., MD Debridement Type: Debridement Severity of Tissue Pre Fat layer exposed Debridement: Level of Consciousness (Pre- Awake and Alert procedure): Pre-procedure Verification/Time Out Taken: Yes - 10:36 Start Time: 10:36 Pain Control: Other : Benzocaine 20% Total Area Debrided (L x W): 4 (cm) x 4 (cm) = 16 (cm) Tissue and other material Viable, Non-Viable, Callus, Subcutaneous, Skin: Epidermis debrided: Level: Skin/Subcutaneous Tissue Debridement Description: Excisional Instrument: Blade, Curette, Forceps Bleeding: Minimum Hemostasis Achieved: Pressure End Time: 10:40 Procedural Pain: 0 Post Procedural Pain: 0 Response to Treatment: Procedure was tolerated well Level of Consciousness Awake and Alert (Post-procedure): Post Debridement Measurements of Total Wound Length: (cm) 3.9 Width: (cm) 9.5 Depth: (cm) 0.2 Volume: (cm) 5.82 Character of Wound/Ulcer Post Improved Debridement: Severity of Tissue Post Debridement: Fat layer exposed Post Procedure Diagnosis Same as Pre-procedure Electronic Signature(s) Signed: 11/17/2018 5:25:49 PM By: Nathaniel Ham MD Signed: 11/17/2018 5:46:53 PM By: Nathaniel Hurst RN, BSN Entered By: Nathaniel White on 11/17/2018 11:38:41 -------------------------------------------------------------------------------- HPI Details Patient Name: Date of Service: Nathaniel White 11/17/2018 9:45  AM Medical Record [id number] Patient Account Number: [id number] Date of Birth/Sex: Treating RN: 04-22-1960 (58 y.o. Nathaniel White Primary Care Provider: Geryl White Other Clinician: Referring Provider: Treating Provider/Extender:Nathaniel White, Nathaniel White, Nathaniel White in Treatment: 20 History of Present Illness HPI Description: ADMISSION 06/30/2018 The patient is a 58 year old man with multiple medical issues including type 2 diabetes on dialysis. Currently the patient his dialysis is actually caused by nephrosclerosis and he has been on dialysis for 20 years. Roughly a month ago according to the patient he noticed a small open area on his left heel he showed it to his caregiver and according to her this rapidly expanded. Looking through [facility] health link he was in the ER with left foot pain on 4/3. An x-ray was negative noting only pedal vessel atherosclerosis. He was also seen on 4/22 at that point he was felt to have a cellulitis and abscess of the left foot he was given Silvadene. After her third visit to the ER and a visit to podiatry [Nathaniel White] he was referred here. He has been using Silvadene. The patient thinks that he was bitten by something initially, but he did not actually see this The patient has a history of type 2 diabetes although he is not currently on anything for this and his hemoglobin A1c was 5; COPD, dilated cardiomyopathy, mitral regurgitation, CHF, remote CVA with left-sided hemiparesis, hypertension ABI in our clinic was not easy to obtain although we were able to get 0.93 on the left 6/29; I did a debridement on this last time however once again it he comes in with a mostly black eschar at surface. Very tender. We have not yet managed to get arterial studies on him. I will see if I can get them done through Nathaniel White office 7/9; his arterial studies were done on 7/1. On the right side his studies are not too bad with noncompressible ABIs at the PTA  great TBI of 0.64 and monophasic waveforms on the left however an ABI could not be obtained at the ATA,  at the PTA his ABI was 0.2 with a dampened monophasic waveforms there was no pressure in the great toe. He is developed a gangrenous-looking second toes since the last time I saw this patient. There is an ulcer on the medial aspect the second toe. The area on his heel is declining 7/21; since the patient was last here he was revascularized by Dr. Donzetta White on 07/21/2018. Noteworthy on the left that the SFA was approximately 50% stenosed at the takeoff but this was not treated. Popliteal artery had heavy areas of calcification and included in one segment and reconstituted just beyond this. This area was stented. After stenting there was 0% residual stenosis. It was noted he appeared to have three-vessel runoff via the tibials which are heavily calcified but without focal occlusions. The patient does not complain of pain. The area on the left lateral heel is a deep necrotic wound it does not look any more viable this week. He has a gangrenous second toe which I do not think is going to be viable. Finally he has a new area on the left posterior calf 7/28; patient arrives with the mummified second toe. The large necrotic wound on the left lateral calcaneus is separating black eschar. He had a new area superiorly on the posterior calf. Home health could not get Iodoflex and he arrived with calcium alginate. He has been revascularized by Dr. Donzetta White patient is not aware when his follow-up appointment is. 8/4-Patient is back to the clinic with the mummified second toe on the left, left calcaneal ulcer with black eschar and visible tendon. His appointment with Nathaniel White is on the 14th of this month. I have indicated to him that this is important to keep 8/11-8/11-Patient comes back for his left medial ankle wound, the more distal wound is bigger, has a lot of necrotic tissue, greenish drainage, eschar coating a  portion of the wound 8/18; I believe the patient has had follow-up arterial studies ordered by Dr. Donzetta White but I do not think they have been done and the patient and his friend are not aware of an appointment time. He has a mummified left second toe and a small area on the left posterior calf in the mid aspect. This substantial area is on the left Achilles area extending into the left lateral heel. This is a deep necrotic wound. It no longer has the black gangrenous surface. His friend says that there is still a malodorous discharge. I note that Dr. Evette Doffing did a culture of this area on 8/12. This grew Enterococcus faecalis and E. coli. He is started on Augmentin although the E. coli only has intermediate sensitivities here. He was referred to infectious disease. We could add either a first or third generation cephalosporin which should cover the E. coli although I did not do this today. 8/25; the patient has a mummified left second toe a substantial wound over the left Achilles area with exposed tendon. And a small area on the left posterior calf. He has been revascularized by Dr. Donzetta White in July. He has not made it for his follow-up noninvasive studies. X-ray I did of the left heel last week showed soft tissue gas along the posterior aspect of the heel and soft tissue defect and findings suspicious for osteomyelitis involving the distal aspect of the second proximal phalanx. This is the mummified toe. The patient went to see Dr. Linus Salmons of infectious disease. I am not able to really follow the flow of his note but according to  when I am seeing he did not add or suggest any additional antibiotics unless he called these into dialysis. 9/1; I was able to review Dr. Henreitta Leber notes he did not feel any further antibiotics were indicated. I still do not see where the patient had his postprocedure noninvasive studies. We have been using silver collagen to the substantial area on the left Achilles. This has  exposed tendon. He has a mummified left second toe. I am doubtful there is any viable tissue here. The patient is nonambulatory. He has flexion contractures of his left hip and knee. 10/1; the patient has not been here in a month I am really not sure why. He was seen by vein and vascular on 9/25 he had his noninvasive studies done. His monophasic waveforms on the left were noted. He had Dopplers across the stent which was felt to be patent. The patient was seen by Dagoberto Ligas PAC. The arterial duplex today demonstrated a patent popliteal stent with improved TBI. Apparently his caregiver demanded an amputation but this was not felt to be necessary. He is on aspirin and Plavix. Dressings are being changed by home health he was using Prisma. 10/9; patient sees the vascular next week. We will call urgently this morning by home health report that the mom applied left second toe had "almost fallen off". Nevertheless the patient arrived in my clinic today. He has a lateral left heel wound. 10/16; substantial wound on the left lateral heel extending into the Achilles area. Also a mummified area on the left second toe. I contacted Dr. Donzetta White about that earlier in the week. He agrees that the left second toe needs an amputation. Apparently noninvasive Doppler tests in Dr. Claretha Cooper office showed good blood flow according to the patient although I did not see this in the note 11/9; substantial wound on the left lateral heel extending to the Achilles area. The left second toe was amputated by Dr. Donzetta White on 10/28. I did not review this area. Electronic Signature(s) Signed: 11/17/2018 5:25:49 PM By: Nathaniel Ham MD Entered By: Nathaniel White on 11/17/2018 11:39:47 -------------------------------------------------------------------------------- Physical Exam Details Patient Name: Date of Service: Nathaniel White, Nathaniel White 11/17/2018 9:45 AM Medical Record [id number] Patient Account Number: [id number] Date of  Birth/Sex: Treating RN: 03/16/1960 (58 y.o. Nathaniel White Primary Care Provider: Geryl White Other Clinician: Referring Provider: Treating Provider/Extender:Raynard Mapps, Nathaniel White, [name] in Treatment: 20 Constitutional Sitting or standing Blood Pressure is within target range for patient.. Pulse regular and within target range for patient.Marland Kitchen Respirations regular, non-labored and within target range.. Temperature is normal and within the target range for the patient.Marland Kitchen Appears in no distress. Notes Wound exam; the patient has a large wound in the left Achilles area extending into the left part of the heel. Extensive debridement of this area for the first time removing callus, subcutaneous debris from around the wound circumference hemostasis with a pressure dressing. There is no evidence of infection in general the wound looks better Electronic Signature(s) Signed: 11/17/2018 5:25:49 PM By: Nathaniel Ham MD Entered By: Nathaniel White on 11/17/2018 11:40:53 -------------------------------------------------------------------------------- Physician Orders Details Patient Name: Date of Service: Nathaniel White, Nathaniel White 11/17/2018 9:45 AM Medical Record [id number] Patient Account Number: [id number] Date of Birth/Sex: Treating RN: 09/13/1960 (57 y.o. Nathaniel White Primary Care Provider: Geryl White Other Clinician: Referring Provider: Treating Provider/Extender:Rachell Druckenmiller, Nathaniel White, Nathaniel White in Treatment: 20 Verbal / Phone Orders: No Diagnosis Coding ICD-10 Coding Code Description E11.621 Type 2 diabetes mellitus with foot ulcer E11.52 Type 2 diabetes  mellitus with diabetic peripheral angiopathy with gangrene I87.333 Chronic venous hypertension (idiopathic) with ulcer and inflammation of bilateral lower extremity L97.423 Non-pressure chronic ulcer of left heel and midfoot with necrosis of muscle L97.221 Non-pressure chronic ulcer of left calf limited to  breakdown of skin Follow-up Appointments Return Appointment in 1 week. Dressing Change Frequency Wound #1 Left,Lateral Calcaneus Change dressing three times week. Wound Cleansing Wound #1 Left,Lateral Calcaneus Clean wound with Wound Cleanser - or normal saline Primary Wound Dressing Wound #1 Left,Lateral Calcaneus Hydrofera Blue Secondary Dressing Wound #1 Left,Lateral Calcaneus Kerlix/Rolled Gauze Dry Gauze ABD pad Off-Loading Other: - float feet off of bed/chair by placing pillow under legs [place] skilled [facility] for wound care. - [facility] Electronic Signature(s) Signed: 11/17/2018 5:25:49 PM By: Nathaniel Ham MD Signed: 11/17/2018 5:46:53 PM By: Nathaniel Hurst RN, BSN Entered By: Nathaniel White on 11/17/2018 10:42:35 -------------------------------------------------------------------------------- Problem List Details Patient Name: Date of Service: Nathaniel White, Nathaniel White 11/17/2018 9:45 AM Medical Record [id number] Patient Account Number: [id number] Date of Birth/Sex: Treating RN: October 28, 1960 (58 y.o. Nathaniel White Primary Care Provider: Geryl White Other Clinician: Referring Provider: Treating Provider/Extender:Suriya Kovarik, Nathaniel White, Nathaniel White in Treatment: 20 Active Problems ICD-10 Evaluated Encounter Code Description Active Date Today Diagnosis E11.621 Type 2 diabetes mellitus with foot ulcer 06/30/2018 No Yes E11.52 Type 2 diabetes mellitus with diabetic peripheral 07/17/2018 No Yes angiopathy with gangrene I87.333 Chronic venous hypertension (idiopathic) with ulcer 06/30/2018 No Yes and inflammation of bilateral lower extremity L97.423 Non-pressure chronic ulcer of left heel and midfoot 07/29/2018 No Yes with necrosis of muscle L97.221 Non-pressure chronic ulcer of left calf limited to 07/29/2018 No Yes breakdown of skin Inactive Problems ICD-10 Code Description Active Date Inactive Date E11.51 Type 2 diabetes mellitus with  diabetic peripheral angiopathy 06/30/2018 06/30/2018 without gangrene Resolved Problems Electronic Signature(s) Signed: 11/17/2018 5:25:49 PM By: Nathaniel Ham MD Entered By: Nathaniel White on 11/17/2018 11:38:13 -------------------------------------------------------------------------------- Progress Note Details Patient Name: Date of Service: Nathaniel White 11/17/2018 9:45 AM Medical Record [id number] Patient Account Number: [id number] Date of Birth/Sex: Treating RN: Sep 18, 1960 (58 y.o. Nathaniel White Primary Care Provider: Geryl White Other Clinician: Referring Provider: Treating Provider/Extender:Mylin Hirano, Nathaniel White, Nathaniel White in Treatment: 20 Subjective History of Present Illness (HPI) ADMISSION 06/30/2018 The patient is a 58 year old man with multiple medical issues including type 2 diabetes on dialysis. Currently the patient his dialysis is actually caused by nephrosclerosis and he has been on dialysis for 20 years. Roughly a month ago according to the patient he noticed a small open area on his left heel he showed it to his caregiver and according to her this rapidly expanded. Looking through [facility] health link he was in the ER with left foot pain on 4/3. An x-ray was negative noting only pedal vessel atherosclerosis. He was also seen on 4/22 at that point he was felt to have a cellulitis and abscess of the left foot he was given Silvadene. After her third visit to the ER and a visit to podiatry [Nathaniel White] he was referred here. He has been using Silvadene. The patient thinks that he was bitten by something initially, but he did not actually see this The patient has a history of type 2 diabetes although he is not currently on anything for this and his hemoglobin A1c was 5; COPD, dilated cardiomyopathy, mitral regurgitation, CHF, remote CVA with left-sided hemiparesis, hypertension ABI in our clinic was not easy to obtain although we were able to get 0.93 on  the left  6/29; I did a debridement on this last time however once again it he comes in with a mostly black eschar at surface. Very tender. We have not yet managed to get arterial studies on him. I will see if I can get them done through Nathaniel White office 7/9; his arterial studies were done on 7/1. On the right side his studies are not too bad with noncompressible ABIs at the PTA great TBI of 0.64 and monophasic waveforms on the left however an ABI could not be obtained at the ATA, at the PTA his ABI was 0.2 with a dampened monophasic waveforms there was no pressure in the great toe. He is developed a gangrenous-looking second toes since the last time I saw this patient. There is an ulcer on the medial aspect the second toe. The area on his heel is declining 7/21; since the patient was last here he was revascularized by Dr. Donzetta White on 07/21/2018. Noteworthy on the left that the SFA was approximately 50% stenosed at the takeoff but this was not treated. Popliteal artery had heavy areas of calcification and included in one segment and reconstituted just beyond this. This area was stented. After stenting there was 0% residual stenosis. It was noted he appeared to have three-vessel runoff via the tibials which are heavily calcified but without focal occlusions. The patient does not complain of pain. The area on the left lateral heel is a deep necrotic wound it does not look any more viable this week. He has a gangrenous second toe which I do not think is going to be viable. Finally he has a new area on the left posterior calf 7/28; patient arrives with the mummified second toe. The large necrotic wound on the left lateral calcaneus is separating black eschar. He had a new area superiorly on the posterior calf. Home health could not get Iodoflex and he arrived with calcium alginate. He has been revascularized by Dr. Donzetta White patient is not aware when his follow-up appointment is. 8/4-Patient is back to the  clinic with the mummified second toe on the left, left calcaneal ulcer with black eschar and visible tendon. His appointment with Nathaniel White is on the 14th of this month. I have indicated to him that this is important to keep 8/11-8/11-Patient comes back for his left medial ankle wound, the more distal wound is bigger, has a lot of necrotic tissue, greenish drainage, eschar coating a portion of the wound 8/18; I believe the patient has had follow-up arterial studies ordered by Dr. Donzetta White but I do not think they have been done and the patient and his friend are not aware of an appointment time. He has a mummified left second toe and a small area on the left posterior calf in the mid aspect. This substantial area is on the left Achilles area extending into the left lateral heel. This is a deep necrotic wound. It no longer has the black gangrenous surface. His friend says that there is still a malodorous discharge. I note that Dr. Evette Doffing did a culture of this area on 8/12. This grew Enterococcus faecalis and E. coli. He is started on Augmentin although the E. coli only has intermediate sensitivities here. He was referred to infectious disease. We could add either a first or third generation cephalosporin which should cover the E. coli although I did not do this today. 8/25; the patient has a mummified left second toe a substantial wound over the left Achilles area with exposed tendon. And a small  area on the left posterior calf. He has been revascularized by Dr. Donzetta White in July. He has not made it for his follow-up noninvasive studies. X-ray I did of the left heel last week showed soft tissue gas along the posterior aspect of the heel and soft tissue defect and findings suspicious for osteomyelitis involving the distal aspect of the second proximal phalanx. This is the mummified toe. The patient went to see Dr. Linus Salmons of infectious disease. I am not able to really follow the flow of his note but according  to when I am seeing he did not add or suggest any additional antibiotics unless he called these into dialysis. 9/1; I was able to review Dr. Henreitta Leber notes he did not feel any further antibiotics were indicated. I still do not see where the patient had his postprocedure noninvasive studies. We have been using silver collagen to the substantial area on the left Achilles. This has exposed tendon. He has a mummified left second toe. I am doubtful there is any viable tissue here. The patient is nonambulatory. He has flexion contractures of his left hip and knee. 10/1; the patient has not been here in a month I am really not sure why. He was seen by vein and vascular on 9/25 he had his noninvasive studies done. His monophasic waveforms on the left were noted. He had Dopplers across the stent which was felt to be patent. The patient was seen by Dagoberto Ligas PAC. The arterial duplex today demonstrated a patent popliteal stent with improved TBI. Apparently his caregiver demanded an amputation but this was not felt to be necessary. He is on aspirin and Plavix. Dressings are being changed by home health he was using Prisma. 10/9; patient sees the vascular next week. We will call urgently this morning by home health report that the mom applied left second toe had "almost fallen off". Nevertheless the patient arrived in my clinic today. He has a lateral left heel wound. 10/16; substantial wound on the left lateral heel extending into the Achilles area. Also a mummified area on the left second toe. I contacted Dr. Donzetta White about that earlier in the week. He agrees that the left second toe needs an amputation. Apparently noninvasive Doppler tests in Dr. Claretha Cooper office showed good blood flow according to the patient although I did not see this in the note 11/9; substantial wound on the left lateral heel extending to the Achilles area. The left second toe was amputated by Dr. Donzetta White on 10/28. I did not review this  area. Objective Constitutional Sitting or standing Blood Pressure is within target range for patient.. Pulse regular and within target range for patient.Marland Kitchen Respirations regular, non-labored and within target range.. Temperature is normal and within the target range for the patient.Marland Kitchen Appears in no distress. Vitals Time Taken: 10:00 AM, Height: 69 in, Weight: 170 lbs, BMI: 25.1, Temperature: 97.6 F, Pulse: 82 bpm, Respiratory Rate: 18 breaths/min, Blood Pressure: 114/69 mmHg. General Notes: Wound exam; the patient has a large wound in the left Achilles area extending into the left part of the heel. Extensive debridement of this area for the first time removing callus, subcutaneous debris from around the wound circumference hemostasis with a pressure dressing. There is no evidence of infection in general the wound looks better Integumentary (Hair, Skin) Wound #1 status is Open. Original cause of wound was Gradually Appeared. The wound is located on the Left,Lateral Calcaneus. The wound measures 3.9cm length x 9.5cm width x 0.2cm depth; 29.099cm^2 area and  5.82cm^3 volume. There is muscle, tendon, and Fat Layer (Subcutaneous Tissue) Exposed exposed. There is no tunneling or undermining noted. There is a medium amount of serosanguineous drainage noted. The wound margin is well defined and not attached to the wound base. There is large (67-100%) red granulation within the wound bed. There is a small (1-33%) amount of necrotic tissue within the wound bed including Adherent Slough and Necrosis of Muscle. General Notes: callus to periwound noted. Assessment Active Problems ICD-10 Type 2 diabetes mellitus with foot ulcer Type 2 diabetes mellitus with diabetic peripheral angiopathy with gangrene Chronic venous hypertension (idiopathic) with ulcer and inflammation of bilateral lower extremity Non-pressure chronic ulcer of left heel and midfoot with necrosis of muscle Non-pressure chronic ulcer of  left calf limited to breakdown of skin Procedures Wound #1 Pre-procedure diagnosis of Wound #1 is a Diabetic Wound/Ulcer of the Lower Extremity located on the Left,Lateral Calcaneus .Severity of Tissue Pre Debridement is: Fat layer exposed. There was a Excisional Skin/Subcutaneous Tissue Debridement with a total area of 16 sq cm performed by Nathaniel White., MD. With the following instrument(s): Blade, Curette, and Forceps to remove Viable and Non-Viable tissue/material. Material removed includes Callus, Subcutaneous Tissue, and Skin: Epidermis after achieving pain control using Other (Benzocaine 20%). No specimens were taken. A time out was conducted at 10:36, prior to the start of the procedure. A Minimum amount of bleeding was controlled with Pressure. The procedure was tolerated well with a pain level of 0 throughout and a pain level of 0 following the procedure. Post Debridement Measurements: 3.9cm length x 9.5cm width x 0.2cm depth; 5.82cm^3 volume. Character of Wound/Ulcer Post Debridement is improved. Severity of Tissue Post Debridement is: Fat layer exposed. Post procedure Diagnosis Wound #1: Same as Pre-Procedure Plan Follow-up Appointments: Return Appointment in 1 week. Dressing Change Frequency: Wound #1 Left,Lateral Calcaneus: Change dressing three times week. Wound Cleansing: Wound #1 Left,Lateral Calcaneus: Clean wound with Wound Cleanser - or normal saline Primary Wound Dressing: Wound #1 Left,Lateral Calcaneus: Hydrofera Blue Secondary Dressing: Wound #1 Left,Lateral Calcaneus: Kerlix/Rolled Gauze Dry Gauze ABD pad Off-Loading: Other: - float feet off of bed/chair by placing pillow under legs Home Health: [facility] for wound care. - [facility] Left lateral calcaneus. Hydrofera Blue to replace silver alginate. I have removed copious amounts of debris from the wound circumference. May need to work on the surface of the wound next  week. 2. I did not look at the toe amputation site Electronic Signature(s) Signed: 11/17/2018 5:25:49 PM By: Nathaniel Ham MD Entered By: Nathaniel White on 11/17/2018 11:41:47 -------------------------------------------------------------------------------- SuperBill Details Patient Name: Date of Service: Nathaniel White 11/17/2018 Medical Record [id number] Patient Account Number: [id number] Date of Birth/Sex: Treating RN: 05/29/1960 (58 y.o. 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

## 2018-11-17 NOTE — Progress Notes (Addendum)
Nathaniel White, Nathaniel White ([id number]) Visit Report for 11/17/2018 Arrival Information Details Patient Name: Date of Service: Nathaniel White, Nathaniel White 11/17/2018 9:45 AM Medical Record [id number] Patient Account Number: [id number] Date of Birth/Sex: Treating RN: [birth year] (58 y.o. [name] Primary Care Erroll Wilbourne: Geryl Rankins Other Clinician: Referring Lalah Durango: Treating Kenechukwu Eckstein/Extender:Robson, Rodrigo Ran, Paulette Blanch in Treatment: 20 Visit Information History Since Last Visit Added or deleted any medications: No Patient Arrived: Wheel Chair Any new allergies or adverse reactions: No Arrival Time: 09:58 Had a fall or experienced change in No activities of daily living that may affect Accompanied By: self risk of falls: Transfer Assistance: None Signs or symptoms of abuse/neglect since last No Patient Identification Verified: Yes visito Secondary Verification Process Completed: Yes Hospitalized since last visit: No Patient Requires Transmission-Based No Implantable device outside of the clinic excluding No Precautions: cellular tissue based products placed in the center Patient Has Alerts: No since last visit: Has Dressing in Place as Prescribed: Yes Pain Present Now: No Electronic Signature(s) Signed: 11/17/2018 5:35:58 PM By: Kela Millin Entered By: Kela Millin on 11/17/2018 10:00:05 -------------------------------------------------------------------------------- Encounter Discharge Information Details Patient Name: Date of Service: Nathaniel White 11/17/2018 9:45 AM Medical Record [id number] Patient Account Number: [id number] Date of Birth/Sex: Treating RN: [birth year] (58 y.o. [name] Primary Care Marny Smethers: Geryl Rankins Other Clinician: Referring Arletha Marschke: Treating Sam Wunschel/Extender:Robson, Rodrigo Ran, Paulette Blanch in Treatment: 20 Encounter Discharge Information Items Post Procedure Vitals Discharge Condition:  Stable Temperature (F): 97.6 Ambulatory Status: Wheelchair Pulse (bpm): 82 Discharge Destination: Home Respiratory Rate (breaths/min): 18 Transportation: Other Blood Pressure (mmHg): 114/69 Accompanied By: self Schedule Follow-up Appointment: Yes Clinical Summary of Care: Patient Declined Electronic Signature(s) Signed: 11/17/2018 5:35:58 PM By: Kela Millin Entered By: Kela Millin on 11/17/2018 10:46:White -------------------------------------------------------------------------------- Lower Extremity Assessment Details Patient Name: Date of Service: Nathaniel White, Nathaniel White 11/17/2018 9:45 AM Medical Record [id number] Patient Account Number: [id number] Date of Birth/Sex: Treating RN: [birth year], Nathaniel White (58 y.o. [name] Primary Care Aidenjames Heckmann: Geryl Rankins Other Clinician: Referring Alin Chavira: Treating Zykeria Laguardia/Extender:Robson, Rodrigo Ran, [name] in Treatment: 20 Edema Assessment Assessed: [Left: Yes] [Right: No] Edema: [Left: Ye] [Right: s] Calf Left: Right: Point of Measurement: 40 cm From Medial Instep 38 cm cm Ankle Left: Right: Point of Measurement: 10 cm From Medial Instep 26.5 cm cm Vascular Assessment Pulses: Dorsalis Pedis Palpable: [Left:Yes] Electronic Signature(s) Signed: 11/17/2018 5:35:58 PM By: Kela Millin Entered By: Kela Millin on 11/17/2018 10:04:40 -------------------------------------------------------------------------------- Multi Wound Chart Details Patient Name: Date of Service: Nathaniel White 11/17/2018 9:45 AM Medical Record [id number] Patient Account Number: [id number] Date of Birth/Sex: Treating RN: [birth year], Nathaniel White (58 y.o. [name] Primary Care Dulse Rutan: Geryl Rankins Other Clinician: Referring Becket Wecker: Treating Nejla Reasor/Extender:Robson, Rodrigo Ran, [name] in Treatment: 20 Vital Signs Height(in): 61 Pulse(bpm): 73 Weight(lbs): 170 Blood Pressure(mmHg): 114/69 Body Mass  Index(BMI): 25 Temperature(F): 97.6 Respiratory 18 Rate(breaths/min): Photos: [1:No Photos] [N/A:N/A] Wound Location: [1:Left Calcaneus - Lateral] [N/A:N/A] Wounding Event: [1:Gradually Appeared] [N/A:N/A] Primary Etiology: [1:Diabetic Wound/Ulcer of the N/A Lower Extremity] Comorbid History: [1:Chronic Obstructive Pulmonary Disease (COPD), Congestive Heart Failure, Hypertension, Type II Diabetes, End Stage Renal Disease] [N/A:N/A] Date Acquired: [1:06/02/2018] [N/A:N/A] Weeks of Treatment: [1:20] [N/A:N/A] Wound Status: [1:Open] [N/A:N/A] Measurements L x W x D 3.9x9.5x0.2 [N/A:N/A] (cm) Area (cm) : [1:29.099] [N/A:N/A] Volume (cm) : [1:5.82] [N/A:N/A] % Reduction in Area: [1:7.40%] [N/A:N/A] % Reduction in Volume: 38.20% [N/A:N/A] Classification: [1:Grade 4] [N/A:N/A] Exudate Amount: [1:Medium] [N/A:N/A] Exudate Type: [1:Serosanguineous] [N/A:N/A] Exudate Color: [1:red, brown] [N/A:N/A] Wound Margin: [1:Well defined, not attached N/A]  Granulation Amount: [1:Large (67-100%)] [N/A:N/A] Granulation Quality: [1:Red] [N/A:N/A] Necrotic Amount: [1:Small (1-33%)] [N/A:N/A] Exposed Structures: [1:Fat Layer (Subcutaneous N/A Tissue) Exposed: Yes Tendon: Yes Muscle: Yes Fascia: No Joint: No Bone: No] Epithelialization: [1:Medium (34-66%)] [N/A:N/A] Debridement: [1:Debridement - Excisional N/A] Pre-procedure [1:10:36] [N/A:N/A] Verification/Time Out Taken: Pain Control: [1:Other] [N/A:N/A] Tissue Debrided: [1:Callus, Subcutaneous] [N/A:N/A] Level: [1:Skin/Subcutaneous Tissue N/A] Debridement Area (sq cm):16 [N/A:N/A] Instrument: [1:Blade, Curette, Forceps] [N/A:N/A] Bleeding: [1:Minimum] [N/A:N/A] Hemostasis Achieved: [1:Pressure] [N/A:N/A] Procedural Pain: [1:0] [N/A:N/A] Post Procedural Pain: [1:0] [N/A:N/A] Debridement Treatment [1:Procedure was tolerated] [N/A:N/A] Response: [1:well] Post Debridement [1:3.9x9.5x0.2] [N/A:N/A] Measurements L x W x D (cm) Post Debridement  [1:5.82] [N/A:N/A] Volume: (cm) Assessment Notes: [1:callus to periwound noted. N/A Debridement] [N/A:N/A] Treatment Notes Wound #1 (Left, Lateral Calcaneus) 1. Cleanse With Wound Cleanser 3. Primary Dressing Applied Hydrofera Blue 4. Secondary Dressing ABD Pad Dry Gauze Roll Gauze 5. Secured With Tape Notes netting. Electronic Signature(s) Signed: 11/17/2018 5:25:49 PM By: Linton Ham MD Signed: 11/17/2018 5:46:53 PM By: Levan Hurst RN, BSN Entered By: Linton Ham on 11/17/2018 11:38:22 -------------------------------------------------------------------------------- Multi-Disciplinary Care Plan Details Patient Name: Date of Service: Nathaniel White, Nathaniel White 11/17/2018 9:45 AM Medical Record [phone] Patient Account Number: [id number] Date of Birth/Sex: Treating RN: [birth year] (58 y.o. [name] Primary Care Annely Sliva: Geryl Rankins Other Clinician: Referring Colby Catanese: Treating Shayana Hornstein/Extender:Robson, Rodrigo Ran, [name] in Treatment: 20 Active Inactive Wound/Skin Impairment Nursing Diagnoses: Impaired tissue integrity Knowledge deficit related to ulceration/compromised skin integrity Goals: Patient/caregiver will verbalize understanding of skin care regimen Date Initiated: 06/30/2018 Target Resolution Date: 12/19/2018 Goal Status: Active Ulcer/skin breakdown will have a volume reduction of 30% by week 4 Date Initiated: 06/30/2018 Date Inactivated: 08/05/2018 Target Resolution Date: 08/01/2018 Goal Status: Unmet Unmet Reason: comorbities Ulcer/skin breakdown will have a volume reduction of 50% by week 8 Date Initiated: 08/05/2018 Date Inactivated: 09/09/2018 Target Resolution Date: 09/05/2018 Goal Status: Unmet Unmet Reason: comorbities Ulcer/skin breakdown will have a volume reduction of 80% by week 12 Date Initiated: 09/09/2018 Date Inactivated: 10/17/2018 Target Resolution Date: 10/03/2018 Goal Status: Unmet Unmet Reason:  PAD Interventions: Assess patient/caregiver ability to obtain necessary supplies Assess patient/caregiver ability to perform ulcer/skin care regimen upon admission and as needed Assess ulceration(s) every visit Provide education on ulcer and skin care Notes: Electronic Signature(s) Signed: 11/17/2018 5:46:53 PM By: Levan Hurst RN, BSN Entered By: Levan Hurst on 11/17/2018 10:19:41 -------------------------------------------------------------------------------- Pain Assessment Details Patient Name: Date of Service: Nathaniel White 11/17/2018 9:45 AM Medical Record [id number] Patient Account Number: [id number] Date of Birth/Sex: Treating RN: [birth year] (58 y.o. [name] Primary Care Le Faulcon: Geryl Rankins Other Clinician: Referring Jakyiah Briones: Treating Meta Kroenke/Extender:Robson, Rodrigo Ran, Paulette Blanch in Treatment: 20 Active Problems Location of Pain Severity and Description of Pain Patient Has Paino No Site Locations Pain Management and Medication Current Pain Management: Electronic Signature(s) Signed: 11/17/2018 5:35:58 PM By: Kela Millin Entered By: Kela Millin on 11/17/2018 10:White:32 -------------------------------------------------------------------------------- Patient/Caregiver Education Details Patient Name: Date of Service: Nathaniel White 11/9/2020andnbsp9:45 AM Medical Record [id number] Patient Account Number: [id number] Date of Birth/Gender: Treating RN: Nathaniel White, Nathaniel White (58 y.o. [name] Primary Care Physician: Geryl Rankins Other Clinician: Referring Physician: Treating Physician/Extender:Robson, Rodrigo Ran, Paulette Blanch in Treatment: 20 Education Assessment Education Provided To: Patient Education Topics Provided Wound/Skin Impairment: Methods: Explain/Verbal Responses: State content correctly Electronic Signature(s) Signed: 11/17/2018 5:46:53 PM By: Levan Hurst RN, BSN Entered By: Levan Hurst on 11/17/2018 10:20:04 -------------------------------------------------------------------------------- Wound Assessment Details Patient Name: Date of Service: Nathaniel White 11/17/2018 9:45 AM Medical Record [phone] Patient Account  Number: [id number] Date of Birth/Sex: Treating RN: Nathaniel White, Nathaniel White (58 y.o. [name] Primary Care Alexy Heldt: Geryl Rankins Other Clinician: Referring Ghazal Pevey: Treating Raneisha Bress/Extender:Robson, Rodrigo Ran, [name] in Treatment: 20 Wound Status Wound Number: 1 Primary Diabetic Wound/Ulcer of the Lower Extremity Etiology: Wound Location: Left Calcaneus - Lateral Wound Open Wounding Event: Gradually Appeared Status: Date Acquired: 06/02/2018 Comorbid Chronic Obstructive Pulmonary Disease Weeks Of Treatment: 20 History: (COPD), Congestive Heart Failure, Clustered Wound: No Hypertension, Type II Diabetes, End Stage Renal Disease Photos Wound Measurements Length: (cm) 3.9 % Reduction in Ar Width: (cm) 9.5 % Reduction in Vo Depth: (cm) 0.2 Epithelialization Area: (cm) 29.099 Tunneling: Volume: (cm) 5.82 Undermining: Wound Description Classification: Grade 4 Foul Odor After C Wound Margin: Well defined, not attached Slough/Fibrino Exudate Amount: Medium Exudate Type: Serosanguineous Exudate Color: red, brown Wound Bed Granulation Amount: Large (67-100%) Granulation Quality: Red Fascia Exposed: Necrotic Amount: Small (1-33%) Fat Layer (Subcut Necrotic Quality: Adherent Slough Tendon Exposed: Muscle Exposed: Necrosis of Joint Exposed: Bone Exposed: Assessment Notes callus to periwound noted. Treatment Notes Wound #1 (Left, Lateral Calcaneus) 1. Cleanse With Wound Cleanser 3. Primary Dressing Applied Hydrofera Blue 4. Secondary Dressing ABD Pad Dry Gauze Roll Gauze 5. Secured With Tape leansing: No Yes Exposed Structure No aneous Tissue) Exposed: Yes Yes Yes Muscle: Yes No No ea:  7.4% lume: 38.2% : Medium (34-66%) No No Notes netting. Electronic Signature(s) Signed: 11/19/2018 4:27:20 PM By: Mikeal Hawthorne EMT/HBOT Signed: 11/21/2018 5:20:59 PM By: Kela Millin Previous Signature: 11/17/2018 5:35:58 PM Version By: Kela Millin Entered By: Mikeal Hawthorne on 11/19/2018 10:46:05 -------------------------------------------------------------------------------- Vitals Details Patient Name: Date of Service: Nathaniel White, Nathaniel White 11/17/2018 9:45 AM Medical Record [phone] Patient Account Number: [id number] Date of Birth/Sex: Treating RN: [birth year], Nathaniel White (58 y.o. [name] Primary Care Puanani Gene: Geryl Rankins Other Clinician: Referring Monifah Freehling: Treating Chenita Ruda/Extender:Robson, Rodrigo Ran, [name] in Treatment: 20 Vital Signs Time Taken: 10:00 Temperature (F): 97.6 Height (in): 69 Pulse (bpm): 82 Weight (lbs): 170 Respiratory Rate (breaths/min): 18 Body Mass Index (BMI): 25.1 Blood Pressure (mmHg): 114/69 Reference Range: 80 - 120 mg / dl Electronic Signature(s) Signed: 11/17/2018 5:35:58 PM By: Kela Millin Entered By: Kela Millin on 11/17/2018 10:White:26

## 2018-11-17 NOTE — Telephone Encounter (Signed)
Patient asked for pain meds I told infromed him he needed to see a doctor

## 2018-11-18 DIAGNOSIS — N2581 Secondary hyperparathyroidism of renal origin: Secondary | ICD-10-CM | POA: Diagnosis not present

## 2018-11-18 DIAGNOSIS — E119 Type 2 diabetes mellitus without complications: Secondary | ICD-10-CM | POA: Diagnosis not present

## 2018-11-18 DIAGNOSIS — N186 End stage renal disease: Secondary | ICD-10-CM | POA: Diagnosis not present

## 2018-11-18 DIAGNOSIS — Z992 Dependence on renal dialysis: Secondary | ICD-10-CM | POA: Diagnosis not present

## 2018-11-18 NOTE — Telephone Encounter (Signed)
Invalid phone number for Nathaniel White with [facility].  Unable to send request to provider.

## 2018-11-18 NOTE — Telephone Encounter (Signed)
Nathaniel White from [facility] called requesting VO for PT. Nathaniel White said it was ok to leave a voicemail. Please f/u

## 2018-11-20 ENCOUNTER — Ambulatory Visit: Payer: Medicare Other

## 2018-11-20 DIAGNOSIS — Z992 Dependence on renal dialysis: Secondary | ICD-10-CM | POA: Diagnosis not present

## 2018-11-20 DIAGNOSIS — N2581 Secondary hyperparathyroidism of renal origin: Secondary | ICD-10-CM | POA: Diagnosis not present

## 2018-11-20 DIAGNOSIS — N186 End stage renal disease: Secondary | ICD-10-CM | POA: Diagnosis not present

## 2018-11-20 DIAGNOSIS — E119 Type 2 diabetes mellitus without complications: Secondary | ICD-10-CM | POA: Diagnosis not present

## 2018-11-21 ENCOUNTER — Ambulatory Visit (INDEPENDENT_AMBULATORY_CARE_PROVIDER_SITE_OTHER): Payer: Medicare Other | Admitting: Cardiovascular Disease

## 2018-11-21 ENCOUNTER — Encounter: Payer: Self-pay | Admitting: Cardiovascular Disease

## 2018-11-21 ENCOUNTER — Other Ambulatory Visit: Payer: Self-pay

## 2018-11-21 VITALS — BP 140/74 | HR 101 | Ht 69.0 in | Wt 184.2 lb

## 2018-11-21 DIAGNOSIS — I340 Nonrheumatic mitral (valve) insufficiency: Secondary | ICD-10-CM

## 2018-11-21 DIAGNOSIS — I428 Other cardiomyopathies: Secondary | ICD-10-CM

## 2018-11-21 NOTE — Patient Instructions (Signed)
Medication Instructions:  Your provider recommends that you continue on your current medications as directed. Please refer to the Current Medication list given to you today.   *If you need a refill on your cardiac medications before your next appointment, please call your pharmacy*  Testing/Procedures: Your provider has requested that you have an echocardiogram. Echocardiography is a painless test that uses sound waves to create images of your heart. It provides your doctor with information about the size and shape of your heart and how well your heart's chambers and valves are working. This procedure takes approximately one hour. There are no restrictions for this procedure.  Follow-Up: At [facility], you and your health needs are our priority.  As part of our continuing mission to provide you with exceptional heart care, we have created designated Provider Care Teams.  These Care Teams include your primary Cardiologist (physician) and Advanced Practice Providers (APPs -  Physician Assistants and Nurse Practitioners) who all work together to provide you with the care you need, when you need it. Your next appointment:   12 months The format for your next appointment:   In Person Provider:   You may see Dr. Angelena Form or one of the following Advanced Practice Providers on your designated Care Team:    Melina Copa, PA-C  Ermalinda Barrios, PA-C

## 2018-11-21 NOTE — Progress Notes (Signed)
Chief Complaint  Patient presents with  . New Patient (Initial Visit)    Dyspnea   History of Present Illness: 58 yo male with history of mitral regurgitation, HTN, CVA, DM, COPD, cardiomyopathy, PAD, former tobacco abuse and ESRD on HD here today as a new patient to re-establish cardiac care. He was seen once in our clinic in 2015 but has not been here since. He is known to have a non-ischemic cardiomyopathy with LVEF=30% by TEE in 2013. Most recent echo September 2015 with LVEF=40-45%, moderate MR. Failed kidney transplant at [facility] in the past and now on HD Tues/Thurs/Saturday. Cardiac catheterization at [facility] October 2015 with no evidence of CAD. PAD followed in [facility] by Dr. Donzetta Matters. Left popliteal artery stent July 2020. Left second toe amputation October 2020.   He is not sure why he is here today or who sent him. He has no chest pain. The patient denies any chest pain, dyspnea, palpitations, lower extremity edema, orthopnea, PND, dizziness, near syncope or syncope.    Primary Care Physician: Gildardo Pounds, NP  Past Medical History:  Diagnosis Date  . Anemia   . Anxiety   . Asthma   . CHF (congestive heart failure) ([place])   . Complication from renal dialysis device 11/02/2013  . Complication of anesthesia 2017   according to pt and spouse pt was moving around and cough while under and pt had difficulty waking up so the anesthesia had to be reversed. and pt admitted to ICU.   Marland Kitchen COPD (chronic obstructive pulmonary disease) ([place])   . Diabetes mellitus without complication ([place])    Type II - Patient states he does not have diabetes, "they said sometimes when you start dialysis you don't have diabetes any longer"   . ESOPHAGEAL VARICES 10/04/2008   Qualifier: Diagnosis of  By: Fuller Plan MD Marijo Conception ESRD    on HD, [facility] [place]  . Hemiparesis due to old stroke ([place])    left  . Hypertension   . LV dysfunction    EF 25-30% by echo 07/2011  . Memory loss due to medical  condition    due to stroke  . MR (mitral regurgitation)    moderate to severe, echo 07/2011  . Peripheral vascular disease ([place])   . Shortness of breath   . Stroke [facility])    TIA's-left sided weakness  . Tobacco abuse     Past Surgical History:  Procedure Laterality Date  . ABDOMINAL AORTOGRAM W/LOWER EXTREMITY Bilateral 07/21/2018   Procedure: ABDOMINAL AORTOGRAM W/LOWER EXTREMITY;  Surgeon: Waynetta Sandy, MD;  Location: [place] [facility];  Service: Cardiovascular;  Laterality: Bilateral;  . AMPUTATION Left 11/05/2018   Procedure: AMPUTATION SECOND TOE LEFT FOOT;  Surgeon: Waynetta Sandy, MD;  Location: [place];  Service: Vascular;  Laterality: Left;  . AV FISTULA PLACEMENT    . CARDIAC CATHETERIZATION     [facility]  . COLONOSCOPY W/ BIOPSIES AND POLYPECTOMY    . FISTULA SUPERFICIALIZATION Left 11/10/2013   Procedure: FISTULA PLICATION;  Surgeon: Serafina Mitchell, MD;  Location: [place];  Service: Vascular;  Laterality: Left;  . KIDNEY TRANSPLANT     2011 rejected kidney 2012 back on dialysis  . LEFT AND RIGHT HEART CATHETERIZATION WITH CORONARY ANGIOGRAM N/A 10/09/2013   Procedure: LEFT AND RIGHT HEART CATHETERIZATION WITH CORONARY ANGIOGRAM;  Surgeon: Burnell Blanks, MD;  Location: [facility] CATH LAB;  Service: Cardiovascular;  Laterality: N/A;  . PERIPHERAL  VASCULAR INTERVENTION Left 07/21/2018   Procedure: PERIPHERAL VASCULAR INTERVENTION;  Surgeon: Waynetta Sandy, MD;  Location: [place] [facility];  Service: Cardiovascular;  Laterality: Left;  Popliteal  . REVISON OF ARTERIOVENOUS FISTULA Left 08/26/2013   Procedure: EXCISION OF ERODED SKIN AND EXPLORATION OF MAIN LEFT UPPER ARM AV FISTULA;  Surgeon: Rosetta Posner, MD;  Location: [place];  Service: Vascular;  Laterality: Left;  . REVISON OF ARTERIOVENOUS FISTULA Left 02/05/2014   Procedure: REPAIR OF ARTERIOVENOUS FISTULA ANEURYSM;  Surgeon: Serafina Mitchell, MD;  Location: [place];  Service: Vascular;   Laterality: Left;  . SHUNTOGRAM N/A 11/05/2012   Procedure: Fistulogram;  Surgeon: Serafina Mitchell, MD;  Location: [facility] CATH LAB;  Service: Cardiovascular;  Laterality: N/A;  . TEE WITHOUT CARDIOVERSION  08/17/2011   Procedure: TRANSESOPHAGEAL ECHOCARDIOGRAM (TEE);  Surgeon: Larey Dresser, MD;  Location: [place];  Service: Cardiovascular;  Laterality: N/A;  . VIDEO ASSISTED THORACOSCOPY (VATS)/DECORTICATION  08/10/11    Current Outpatient Medications  Medication Sig Dispense Refill  . acetaminophen (TYLENOL) 500 MG tablet Take 1 tablet (500 mg total) by mouth every 6 (six) hours as needed. 30 tablet 0  . albuterol (VENTOLIN HFA) 108 (90 Base) MCG/ACT inhaler Inhale 1-2 puffs into the lungs every 6 (six) hours as needed for wheezing or shortness of breath. 18 g 2  . aspirin EC 81 MG tablet Take 81 mg by mouth 3 (three) times a week.    . Fluticasone-Salmeterol (ADVAIR) 250-50 MCG/DOSE AEPB Inhale 1 puff into the lungs 2 (two) times daily. 60 each 3  . glucose blood (ONE TOUCH ULTRA TEST) test strip     . montelukast (SINGULAIR) 10 MG tablet Take 1 tablet (10 mg total) by mouth at bedtime. 90 tablet 3  . silver sulfADIAZINE (SILVADENE) 1 % cream Apply topically daily to wound 50 g 0   No current facility-administered medications for this visit.     Allergies  Allergen Reactions  . Codeine Shortness Of Breath  . Dextromethorphan-Guaifenesin Other (See Comments)  . Iron Dextran Other (See Comments)  . Morphine And Related Other (See Comments)    Social History   Socioeconomic History  . Marital status: Single    Spouse name: Not on file  . Number of children: 1  . Years of education: Not on file  . Highest education level: Not on file  Occupational History  . Occupation: Geographical information systems officer: DISABLED  Social Needs  . Financial resource strain: Not on file  . Food insecurity    Worry: Not on file    Inability: Not on file  . Transportation needs     Medical: Not on file    Non-medical: Not on file  Tobacco Use  . Smoking status: Former Smoker    Packs/day: 0.50    Years: 15.00    Pack years: 7.50    Types: Cigarettes    Quit date: 2014    Years since quitting: 6.8  . Smokeless tobacco: Never Used  Substance and Sexual Activity  . Alcohol use: No    Alcohol/week: 0.0 standard drinks  . Drug use: No  . Sexual activity: Not Currently  Lifestyle  . Physical activity    Days per week: Not on file    Minutes per session: Not on file  . Stress: Not on file  Relationships  . Social Herbalist on phone: Not on file    Gets together: Not on file  Attends religious service: Not on file    Active member of club or organization: Not on file    Attends meetings of clubs or organizations: Not on file    Relationship status: Not on file  . Intimate partner violence    Fear of current or ex partner: Not on file    Emotionally abused: Not on file    Physically abused: Not on file    Forced sexual activity: Not on file  Other Topics Concern  . Not on file  Social History Narrative  . Not on file    Family History  Problem Relation Age of Onset  . Hypertension Mother   . Varicose Veins Mother   . Diabetes Paternal Grandmother   . Cancer Paternal Grandfather   . CAD Paternal Uncle     Review of Systems:  As stated in the HPI and otherwise negative.   BP 140/74   Pulse (!) 101   Ht 5\' 9"  (1.753 m)   Wt 184 lb 3.2 oz (83.6 kg)   BMI 27.20 kg/m   Physical Examination: General: Well developed, well nourished, NAD  HEENT: OP clear, mucus membranes moist  SKIN: warm, dry. No rashes. Neuro: No focal deficits  Musculoskeletal: Muscle strength 5/5 all ext  Psychiatric: Mood and affect normal  Neck: No JVD, no carotid bruits, no thyromegaly, no lymphadenopathy.  Lungs:Clear bilaterally, no wheezes, rhonci, crackles Cardiovascular: Regular rate and rhythm. Systolic murmurs.  Abdomen:Soft. Bowel sounds present.  Non-tender.  Extremities: No lower extremity edema  EKG:  EKG is ordered today. The ekg ordered today demonstrates sinus tachycardia, rate 101 bpm. PVCs. Non-specific T wave abn  Echo September 2015:  - Left ventricle: The cavity size was normal. There was moderate  concentric hypertrophy. Systolic function was mildly to  moderately reduced. The estimated ejection fraction was in the  range of 40% to 45%. There is hypokinesis of the inferoseptal  myocardium.  - Mitral valve: Moderately calcified annulus. There was moderate  regurgitation.  - Left atrium: The atrium was moderately dilated.  - Right ventricle: The cavity size was mildly dilated. Wall  thickness was normal.  - Right atrium: The atrium was mildly dilated.  - Pulmonary arteries: PA peak pressure: 58 mm Hg (S).   Recent Labs: 03/03/2018: Platelets 73 11/05/2018: BUN 55; Creatinine, Ser 6.30; Hemoglobin 14.6; Potassium 6.5; Sodium 137   Lipid Panel    Component Value Date/Time   CHOL 102 08/14/2011 0400   TRIG 71 08/14/2011 0400   HDL 43 08/14/2011 0400   CHOLHDL 2.4 08/14/2011 0400   VLDL 14 08/14/2011 0400   LDLCALC 45 08/14/2011 0400     Wt Readings from Last 3 Encounters:  11/21/18 184 lb 3.2 oz (83.6 kg)  11/05/18 165 lb (74.8 kg)  10/24/18 165 lb (74.8 kg)      Assessment and Plan:   1. Mitral regurgitation: Moderate by echo in 2015. No echo since then as he was lost to follow up in our office. Systolic murmur on exam. Will repeat echo now.   2. Non-ischemic cardiomyopathy: Will repeat echo now to assess. I would anticipate that he would need to be placed on a beta blocker if his LV systolic function I still depressed.   Current medicines are reviewed at length with the patient today.  The patient does not have concerns regarding medicines.  The following changes have been made:  no change  Labs/ tests ordered today include:   Orders Placed This Encounter  Procedures  .  EKG 12-Lead   . ECHOCARDIOGRAM COMPLETE     Disposition:   FU with me in 12 months.    Signed, Lauree Chandler, MD 11/21/2018 10:44 AM    [facility] [place], [place], [place]  [address] Phone: [phone]; Fax: [phone]

## 2018-11-21 NOTE — Telephone Encounter (Signed)
[facility] called for verbal approval order for physical therapy for lower extremity.   Verbal order approval.

## 2018-11-22 DIAGNOSIS — N186 End stage renal disease: Secondary | ICD-10-CM | POA: Diagnosis not present

## 2018-11-22 DIAGNOSIS — Z992 Dependence on renal dialysis: Secondary | ICD-10-CM | POA: Diagnosis not present

## 2018-11-22 DIAGNOSIS — N2581 Secondary hyperparathyroidism of renal origin: Secondary | ICD-10-CM | POA: Diagnosis not present

## 2018-11-22 DIAGNOSIS — E119 Type 2 diabetes mellitus without complications: Secondary | ICD-10-CM | POA: Diagnosis not present

## 2018-11-24 ENCOUNTER — Encounter (HOSPITAL_BASED_OUTPATIENT_CLINIC_OR_DEPARTMENT_OTHER): Payer: Medicare Other | Admitting: Internal Medicine

## 2018-11-25 DIAGNOSIS — N186 End stage renal disease: Secondary | ICD-10-CM | POA: Diagnosis not present

## 2018-11-25 DIAGNOSIS — N2581 Secondary hyperparathyroidism of renal origin: Secondary | ICD-10-CM | POA: Diagnosis not present

## 2018-11-25 DIAGNOSIS — Z992 Dependence on renal dialysis: Secondary | ICD-10-CM | POA: Diagnosis not present

## 2018-11-25 DIAGNOSIS — E119 Type 2 diabetes mellitus without complications: Secondary | ICD-10-CM | POA: Diagnosis not present

## 2018-11-27 DIAGNOSIS — N186 End stage renal disease: Secondary | ICD-10-CM | POA: Diagnosis not present

## 2018-11-27 DIAGNOSIS — E119 Type 2 diabetes mellitus without complications: Secondary | ICD-10-CM | POA: Diagnosis not present

## 2018-11-27 DIAGNOSIS — Z992 Dependence on renal dialysis: Secondary | ICD-10-CM | POA: Diagnosis not present

## 2018-11-27 DIAGNOSIS — N2581 Secondary hyperparathyroidism of renal origin: Secondary | ICD-10-CM | POA: Diagnosis not present

## 2018-11-28 ENCOUNTER — Other Ambulatory Visit: Payer: Self-pay

## 2018-11-28 ENCOUNTER — Ambulatory Visit (INDEPENDENT_AMBULATORY_CARE_PROVIDER_SITE_OTHER): Payer: Self-pay | Admitting: Physician Assistant

## 2018-11-28 VITALS — BP 113/71 | HR 90 | Temp 98.0°F | Resp 20 | Ht 69.0 in | Wt 184.0 lb

## 2018-11-28 DIAGNOSIS — I739 Peripheral vascular disease, unspecified: Secondary | ICD-10-CM

## 2018-11-28 DIAGNOSIS — L89522 Pressure ulcer of left ankle, stage 2: Secondary | ICD-10-CM

## 2018-11-28 NOTE — Progress Notes (Signed)
  POST OPERATIVE OFFICE NOTE    CC:  F/u for surgery  HPI:  This is a 58 y.o. male who is s/p Left second toe amputation preceded by  left lower extremity revascularization with popliteal artery stent.  He denise fever and chills.  He continues to take Aspirin daily.    Allergies  Allergen Reactions  . Codeine Shortness Of Breath  . Dextromethorphan-Guaifenesin Other (See Comments)  . Iron Dextran Other (See Comments)  . Morphine And Related Other (See Comments)    Current Outpatient Medications  Medication Sig Dispense Refill  . acetaminophen (TYLENOL) 500 MG tablet Take 1 tablet (500 mg total) by mouth every 6 (six) hours as needed. 30 tablet 0  . albuterol (VENTOLIN HFA) 108 (90 Base) MCG/ACT inhaler Inhale 1-2 puffs into the lungs every 6 (six) hours as needed for wheezing or shortness of breath. 18 g 2  . aspirin EC 81 MG tablet Take 81 mg by mouth 3 (three) times a week.    . Fluticasone-Salmeterol (ADVAIR) 250-50 MCG/DOSE AEPB Inhale 1 puff into the lungs 2 (two) times daily. 60 each 3  . glucose blood (ONE TOUCH ULTRA TEST) test strip     . montelukast (SINGULAIR) 10 MG tablet Take 1 tablet (10 mg total) by mouth at bedtime. 90 tablet 3  . silver sulfADIAZINE (SILVADENE) 1 % cream Apply topically daily to wound 50 g 0   No current facility-administered medications for this visit.      ROS:  See HPI  Physical Exam:    Incision:  Second toe amputation left foot has healed.  Suture removal was tolerated well. Extremities:  Left posterior   heel wound healing well.  Doppler signals DP/PT left LE.  Motor decreased baseline s/p CVA> 10 years ago.    Assessment/Plan:  This is a 58 y.o. male who is s/p: Left second toe amputation preceded by left Popliteal artery stent.  He has good doppler signals with patent stent and healing wounds.  He will f/u in 6 months for repeat ABI and arterial duplex of the Left LE.  Continue current wound care.   Roxy Horseman ,  PA-C [facility] [phone]  Clinic MD:  Donzetta Matters

## 2018-11-29 DIAGNOSIS — I69354 Hemiplegia and hemiparesis following cerebral infarction affecting left non-dominant side: Secondary | ICD-10-CM | POA: Diagnosis not present

## 2018-11-29 DIAGNOSIS — Z87891 Personal history of nicotine dependence: Secondary | ICD-10-CM | POA: Diagnosis not present

## 2018-11-29 DIAGNOSIS — J449 Chronic obstructive pulmonary disease, unspecified: Secondary | ICD-10-CM | POA: Diagnosis not present

## 2018-11-29 DIAGNOSIS — I420 Dilated cardiomyopathy: Secondary | ICD-10-CM | POA: Diagnosis not present

## 2018-11-29 DIAGNOSIS — E119 Type 2 diabetes mellitus without complications: Secondary | ICD-10-CM | POA: Diagnosis not present

## 2018-11-29 DIAGNOSIS — N2581 Secondary hyperparathyroidism of renal origin: Secondary | ICD-10-CM | POA: Diagnosis not present

## 2018-11-29 DIAGNOSIS — E1122 Type 2 diabetes mellitus with diabetic chronic kidney disease: Secondary | ICD-10-CM | POA: Diagnosis not present

## 2018-11-29 DIAGNOSIS — E1151 Type 2 diabetes mellitus with diabetic peripheral angiopathy without gangrene: Secondary | ICD-10-CM | POA: Diagnosis not present

## 2018-11-29 DIAGNOSIS — I509 Heart failure, unspecified: Secondary | ICD-10-CM | POA: Diagnosis not present

## 2018-11-29 DIAGNOSIS — L97522 Non-pressure chronic ulcer of other part of left foot with fat layer exposed: Secondary | ICD-10-CM | POA: Diagnosis not present

## 2018-11-29 DIAGNOSIS — Z9981 Dependence on supplemental oxygen: Secondary | ICD-10-CM | POA: Diagnosis not present

## 2018-11-29 DIAGNOSIS — N186 End stage renal disease: Secondary | ICD-10-CM | POA: Diagnosis not present

## 2018-11-29 DIAGNOSIS — I132 Hypertensive heart and chronic kidney disease with heart failure and with stage 5 chronic kidney disease, or end stage renal disease: Secondary | ICD-10-CM | POA: Diagnosis not present

## 2018-11-29 DIAGNOSIS — E11621 Type 2 diabetes mellitus with foot ulcer: Secondary | ICD-10-CM | POA: Diagnosis not present

## 2018-11-29 DIAGNOSIS — Z992 Dependence on renal dialysis: Secondary | ICD-10-CM | POA: Diagnosis not present

## 2018-11-29 DIAGNOSIS — L97422 Non-pressure chronic ulcer of left heel and midfoot with fat layer exposed: Secondary | ICD-10-CM | POA: Diagnosis not present

## 2018-11-29 DIAGNOSIS — Z4800 Encounter for change or removal of nonsurgical wound dressing: Secondary | ICD-10-CM | POA: Diagnosis not present

## 2018-12-01 ENCOUNTER — Encounter (HOSPITAL_BASED_OUTPATIENT_CLINIC_OR_DEPARTMENT_OTHER): Payer: Medicare Other | Admitting: Physician Assistant

## 2018-12-01 DIAGNOSIS — N186 End stage renal disease: Secondary | ICD-10-CM | POA: Diagnosis not present

## 2018-12-01 DIAGNOSIS — E119 Type 2 diabetes mellitus without complications: Secondary | ICD-10-CM | POA: Diagnosis not present

## 2018-12-01 DIAGNOSIS — L97522 Non-pressure chronic ulcer of other part of left foot with fat layer exposed: Secondary | ICD-10-CM | POA: Diagnosis not present

## 2018-12-01 DIAGNOSIS — L97422 Non-pressure chronic ulcer of left heel and midfoot with fat layer exposed: Secondary | ICD-10-CM | POA: Diagnosis not present

## 2018-12-01 DIAGNOSIS — N2581 Secondary hyperparathyroidism of renal origin: Secondary | ICD-10-CM | POA: Diagnosis not present

## 2018-12-01 DIAGNOSIS — Z992 Dependence on renal dialysis: Secondary | ICD-10-CM | POA: Diagnosis not present

## 2018-12-01 DIAGNOSIS — E1122 Type 2 diabetes mellitus with diabetic chronic kidney disease: Secondary | ICD-10-CM | POA: Diagnosis not present

## 2018-12-01 DIAGNOSIS — E1151 Type 2 diabetes mellitus with diabetic peripheral angiopathy without gangrene: Secondary | ICD-10-CM | POA: Diagnosis not present

## 2018-12-01 DIAGNOSIS — Z4800 Encounter for change or removal of nonsurgical wound dressing: Secondary | ICD-10-CM | POA: Diagnosis not present

## 2018-12-01 DIAGNOSIS — E11621 Type 2 diabetes mellitus with foot ulcer: Secondary | ICD-10-CM | POA: Diagnosis not present

## 2018-12-03 DIAGNOSIS — Z992 Dependence on renal dialysis: Secondary | ICD-10-CM | POA: Diagnosis not present

## 2018-12-03 DIAGNOSIS — N2581 Secondary hyperparathyroidism of renal origin: Secondary | ICD-10-CM | POA: Diagnosis not present

## 2018-12-03 DIAGNOSIS — E119 Type 2 diabetes mellitus without complications: Secondary | ICD-10-CM | POA: Diagnosis not present

## 2018-12-03 DIAGNOSIS — N186 End stage renal disease: Secondary | ICD-10-CM | POA: Diagnosis not present

## 2018-12-06 DIAGNOSIS — E119 Type 2 diabetes mellitus without complications: Secondary | ICD-10-CM | POA: Diagnosis not present

## 2018-12-06 DIAGNOSIS — N2581 Secondary hyperparathyroidism of renal origin: Secondary | ICD-10-CM | POA: Diagnosis not present

## 2018-12-06 DIAGNOSIS — Z992 Dependence on renal dialysis: Secondary | ICD-10-CM | POA: Diagnosis not present

## 2018-12-06 DIAGNOSIS — N186 End stage renal disease: Secondary | ICD-10-CM | POA: Diagnosis not present

## 2018-12-10 ENCOUNTER — Ambulatory Visit: Payer: Medicare Other

## 2018-12-10 ENCOUNTER — Other Ambulatory Visit (HOSPITAL_COMMUNITY): Payer: Medicare Other

## 2018-12-12 ENCOUNTER — Encounter (HOSPITAL_BASED_OUTPATIENT_CLINIC_OR_DEPARTMENT_OTHER): Payer: Medicare Other | Attending: Internal Medicine | Admitting: Internal Medicine

## 2018-12-12 ENCOUNTER — Other Ambulatory Visit: Payer: Self-pay

## 2018-12-12 DIAGNOSIS — E11621 Type 2 diabetes mellitus with foot ulcer: Secondary | ICD-10-CM | POA: Diagnosis not present

## 2018-12-12 DIAGNOSIS — L97422 Non-pressure chronic ulcer of left heel and midfoot with fat layer exposed: Secondary | ICD-10-CM | POA: Diagnosis not present

## 2018-12-12 DIAGNOSIS — Z89422 Acquired absence of other left toe(s): Secondary | ICD-10-CM | POA: Diagnosis not present

## 2018-12-16 NOTE — Progress Notes (Signed)
Nathaniel, White ([id number]) Visit Report for 09/02/2018 HPI Details Patient Name: Date of Service: Nathaniel White, Nathaniel White 09/02/2018 3:30 PM Medical Record [id number] Patient Account Number: [id number] Date of Birth/Sex: Treating RN: July 05, 1960 (58 y.o. Jerilynn Mages) Carlene Coria Primary Care Provider: Geryl Rankins Other Clinician: Referring Provider: Treating Provider/Extender:Malosi Hemstreet, Rodrigo Ran, Paulette Blanch in Treatment: 9 History of Present Illness HPI Description: ADMISSION 06/30/2018 The patient is a 58 year old man with multiple medical issues including type 2 diabetes on dialysis. Currently the patient his dialysis is actually caused by nephrosclerosis and he has been on dialysis for 20 years. Roughly a month ago according to the patient he noticed a small open area on his left heel he showed it to his caregiver and according to her this rapidly expanded. Looking through [facility] health link he was in the ER with left foot pain on 4/3. An x-ray was negative noting only pedal vessel atherosclerosis. He was also seen on 4/22 at that point he was felt to have a cellulitis and abscess of the left foot he was given Silvadene. After her third visit to the ER and a visit to podiatry [Dr. Price] he was referred here. He has been using Silvadene. The patient thinks that he was bitten by something initially, but he did not actually see this The patient has a history of type 2 diabetes although he is not currently on anything for this and his hemoglobin A1c was 5; COPD, dilated cardiomyopathy, mitral regurgitation, CHF, remote CVA with left-sided hemiparesis, hypertension ABI in our clinic was not easy to obtain although we were able to get 0.93 on the left 6/29; I did a debridement on this last time however once again it he comes in with a mostly black eschar at surface. Very tender. We have not yet managed to get arterial studies on him. I will see if I can get them done through Dr. Kennon Holter  office 7/9; his arterial studies were done on 7/1. On the right side his studies are not too bad with noncompressible ABIs at the PTA great TBI of 0.64 and monophasic waveforms on the left however an ABI could not be obtained at the ATA, at the PTA his ABI was 0.2 with a dampened monophasic waveforms there was no pressure in the great toe. He is developed a gangrenous-looking second toes since the last time I saw this patient. There is an ulcer on the medial aspect the second toe. The area on his heel is declining 7/21; since the patient was last here he was revascularized by Dr. Donzetta Matters on 07/21/2018. Noteworthy on the left that the SFA was approximately 50% stenosed at the takeoff but this was not treated. Popliteal artery had heavy areas of calcification and included in one segment and reconstituted just beyond this. This area was stented. After stenting there was 0% residual stenosis. It was noted he appeared to have three-vessel runoff via the tibials which are heavily calcified but without focal occlusions. The patient does not complain of pain. The area on the left lateral heel is a deep necrotic wound it does not look any more viable this week. He has a gangrenous second toe which I do not think is going to be viable. Finally he has a new area on the left posterior calf 7/28; patient arrives with the mummified second toe. The large necrotic wound on the left lateral calcaneus is separating black eschar. He had a new area superiorly on the posterior calf. Home health could not get Iodoflex and he  arrived with calcium alginate. He has been revascularized by Dr. Donzetta Matters patient is not aware when his follow-up appointment is. 8/4-Patient is back to the clinic with the mummified second toe on the left, left calcaneal ulcer with black eschar and visible tendon. His appointment with Dr. Gwenlyn Saran is on the 14th of this month. I have indicated to him that this is important to keep 8/11-8/11-Patient comes  back for his left medial ankle wound, the more distal wound is bigger, has a lot of necrotic tissue, greenish drainage, eschar coating a portion of the wound 8/18; I believe the patient has had follow-up arterial studies ordered by Dr. Donzetta Matters but I do not think they have been done and the patient and his friend are not aware of an appointment time. He has a mummified left second toe and a small area on the left posterior calf in the mid aspect. This substantial area is on the left Achilles area extending into the left lateral heel. This is a deep necrotic wound. It no longer has the black gangrenous surface. His friend says that there is still a malodorous discharge. I note that Dr. Evette Doffing did a culture of this area on 8/12. This grew Enterococcus faecalis and E. coli. He is started on Augmentin although the E. coli only has intermediate sensitivities here. He was referred to infectious disease. We could add either a first or third generation cephalosporin which should cover the E. coli although I did not do this today. 8/25; the patient has a mummified left second toe a substantial wound over the left Achilles area with exposed tendon. And a small area on the left posterior calf. He has been revascularized by Dr. Donzetta Matters in July. He has not made it for his follow-up noninvasive studies. X-ray I did of the left heel last week showed soft tissue gas along the posterior aspect of the heel and soft tissue defect and findings suspicious for osteomyelitis involving the distal aspect of the second proximal phalanx. This is the mummified toe. The patient went to see Dr. Linus Salmons of infectious disease. I am not able to really follow the flow of his note but according to when I am seeing he did not add or suggest any additional antibiotics unless he called these into dialysis. Electronic Signature(s) Signed: 09/02/2018 6:47:38 PM By: Linton Ham MD Entered By: Linton Ham on 09/02/2018  18:19:08 -------------------------------------------------------------------------------- Physical Exam Details Patient Name: Date of Service: Nathaniel White, Nathaniel White 09/02/2018 3:30 PM Medical Record [id number] Patient Account Number: [id number] Date of Birth/Sex: Treating RN: [birth year] (58 y.o. Nathaniel White Primary Care Provider: Geryl Rankins Other Clinician: Referring Provider: Treating Provider/Extender:Peachie Barkalow, Rodrigo Ran, [name] in Treatment: 9 Constitutional Sitting or standing Blood Pressure is within target range for patient.. Pulse regular and within target range for patient.Marland Kitchen Respirations regular, non-labored and within target range.. Temperature is normal and within the target range for the patient.Marland Kitchen Appears in no distress. Respiratory work of breathing is normal. Cardiovascular Trouble feeling popliteal or femoral pulses. No pedal pulses on the left. Integumentary (Hair, Skin) No overt infection although the left foot is dusky and dark. Psychiatric appears at normal baseline. Notes Wound exam; the substantial area on the left Achilles has exposed tendon. The tendon itself looks like it is deteriorating. The patient is nonambulatory and I would not think it would be unreasonable to sacrifice this at some point. There is no overt infection here. Small draining area on the left mid posterior calf. Mummified left second toe Electronic Signature(s)  Signed: 09/02/2018 6:47:38 PM By: Linton Ham MD Entered By: Linton Ham on 09/02/2018 18:24:46 -------------------------------------------------------------------------------- Physician Orders Details Patient Name: Date of Service: Nathaniel White 09/02/2018 3:30 PM Medical Record [id number] Patient Account Number: [id number] Date of Birth/Sex: Treating RN: Jun 27, 1960 (58 y.o. Jerilynn Mages) Carlene Coria Primary Care Provider: Geryl Rankins Other Clinician: Referring Provider: Treating  Provider/Extender:Aliea Bobe, Rodrigo Ran, Paulette Blanch in Treatment: 9 Verbal / Phone Orders: No Diagnosis Coding ICD-10 Coding Code Description E11.621 Type 2 diabetes mellitus with foot ulcer E11.52 Type 2 diabetes mellitus with diabetic peripheral angiopathy with gangrene I87.333 Chronic venous hypertension (idiopathic) with ulcer and inflammation of bilateral lower extremity L97.423 Non-pressure chronic ulcer of left heel and midfoot with necrosis of muscle L97.221 Non-pressure chronic ulcer of left calf limited to breakdown of skin Follow-up Appointments Return Appointment in 1 week. Dressing Change Frequency Wound #1 Left,Lateral Calcaneus Change dressing three times week. Wound #2 Left Toe Second Change dressing three times week. Wound Cleansing Wound #1 Left,Lateral Calcaneus Clean wound with Wound Cleanser - or normal saline Wound #2 Left Toe Second Clean wound with Wound Cleanser - or normal saline Wound #3 Left,Posterior Lower Leg Clean wound with Wound Cleanser - or normal saline Primary Wound Dressing Wound #1 Left,Lateral Calcaneus Silver Collagen - moisten with normal saline, KY jelly or hydrogel Wound #2 Left Toe Second Silver Collagen - moisten with normal saline, KY jelly or hydrogel Wound #3 Left,Posterior Lower Leg Silver Collagen - moisten with normal saline, KY jelly or hydrogel Secondary Dressing Wound #1 Left,Lateral Calcaneus Kerlix/Rolled Gauze Dry Gauze Off-Loading Other: - float feet off of bed/chair by placing pillow under legs [facility] skilled nursing for wound care. - [facility] Consults Vascular - patient to follow up with Dr Donzetta Matters - (ICD10 [id number] - Type 2 diabetes mellitus with foot ulcer) Electronic Signature(s) Signed: 09/02/2018 6:47:38 PM By: Linton Ham MD Signed: 12/16/2018 3:07:45 PM By: Carlene Coria RN Entered By: Carlene Coria on 09/02/2018  18:43:03 -------------------------------------------------------------------------------- Problem List Details Patient Name: Date of Service: Nathaniel White 09/02/2018 3:30 PM Medical Record [id number] Patient Account Number: [id number] Date of Birth/Sex: Treating RN: 1960/04/10 (58 y.o. Nathaniel White, Nathaniel White Primary Care Provider: Geryl Rankins Other Clinician: Referring Provider: Treating Provider/Extender:Elizaveta Mattice, Rodrigo Ran, Paulette Blanch in Treatment: 9 Active Problems ICD-10 Evaluated Encounter Code Description Active Date Today Diagnosis E11.621 Type 2 diabetes mellitus with foot ulcer 06/30/2018 No Yes E11.52 Type 2 diabetes mellitus with diabetic peripheral 07/17/2018 No Yes angiopathy with gangrene I87.333 Chronic venous hypertension (idiopathic) with ulcer 06/30/2018 No Yes and inflammation of bilateral lower extremity L97.423 Non-pressure chronic ulcer of left heel and midfoot 07/29/2018 No Yes with necrosis of muscle L97.221 Non-pressure chronic ulcer of left calf limited to 07/29/2018 No Yes breakdown of skin Inactive Problems ICD-10 Code Description Active Date Inactive Date E11.51 Type 2 diabetes mellitus with diabetic peripheral angiopathy 06/30/2018 06/30/2018 without gangrene Resolved Problems Electronic Signature(s) Signed: 09/02/2018 6:47:38 PM By: Linton Ham MD Entered By: Linton Ham on 09/02/2018 18:15:21 -------------------------------------------------------------------------------- Progress Note Details Patient Name: Date of Service: Nathaniel White 09/02/2018 3:30 PM Medical Record [id number] Patient Account Number: [id number] Date of Birth/Sex: Treating RN: July 06, 1960 (58 y.o. Nathaniel White Primary Care Provider: Geryl Rankins Other Clinician: Referring Provider: Treating Provider/Extender:Tiny Rietz, Rodrigo Ran, Paulette Blanch in Treatment: 9 Subjective History of Present Illness (HPI) ADMISSION 06/30/2018 The patient is a  58 year old man with multiple medical issues including type 2 diabetes on dialysis. Currently the patient his dialysis is actually caused by nephrosclerosis  and he has been on dialysis for 20 years. Roughly a month ago according to the patient he noticed a small open area on his left heel he showed it to his caregiver and according to her this rapidly expanded. Looking through [facility] health link he was in the ER with left foot pain on 4/3. An x-ray was negative noting only pedal vessel atherosclerosis. He was also seen on 4/22 at that point he was felt to have a cellulitis and abscess of the left foot he was given Silvadene. After her third visit to the ER and a visit to podiatry [Dr. Price] he was referred here. He has been using Silvadene. The patient thinks that he was bitten by something initially, but he did not actually see this The patient has a history of type 2 diabetes although he is not currently on anything for this and his hemoglobin A1c was 5; COPD, dilated cardiomyopathy, mitral regurgitation, CHF, remote CVA with left-sided hemiparesis, hypertension ABI in our clinic was not easy to obtain although we were able to get 0.93 on the left 6/29; I did a debridement on this last time however once again it he comes in with a mostly black eschar at surface. Very tender. We have not yet managed to get arterial studies on him. I will see if I can get them done through Dr. Kennon Holter office 7/9; his arterial studies were done on 7/1. On the right side his studies are not too bad with noncompressible ABIs at the PTA great TBI of 0.64 and monophasic waveforms on the left however an ABI could not be obtained at the ATA, at the PTA his ABI was 0.2 with a dampened monophasic waveforms there was no pressure in the great toe. He is developed a gangrenous-looking second toes since the last time I saw this patient. There is an ulcer on the medial aspect the second toe. The area on his heel is  declining 7/21; since the patient was last here he was revascularized by Dr. Donzetta Matters on 07/21/2018. Noteworthy on the left that the SFA was approximately 50% stenosed at the takeoff but this was not treated. Popliteal artery had heavy areas of calcification and included in one segment and reconstituted just beyond this. This area was stented. After stenting there was 0% residual stenosis. It was noted he appeared to have three-vessel runoff via the tibials which are heavily calcified but without focal occlusions. The patient does not complain of pain. The area on the left lateral heel is a deep necrotic wound it does not look any more viable this week. He has a gangrenous second toe which I do not think is going to be viable. Finally he has a new area on the left posterior calf 7/28; patient arrives with the mummified second toe. The large necrotic wound on the left lateral calcaneus is separating black eschar. He had a new area superiorly on the posterior calf. Home health could not get Iodoflex and he arrived with calcium alginate. He has been revascularized by Dr. Donzetta Matters patient is not aware when his follow-up appointment is. 8/4-Patient is back to the clinic with the mummified second toe on the left, left calcaneal ulcer with black eschar and visible tendon. His appointment with Dr. Gwenlyn Saran is on the 14th of this month. I have indicated to him that this is important to keep 8/11-8/11-Patient comes back for his left medial ankle wound, the more distal wound is bigger, has a lot of necrotic tissue, greenish drainage, eschar coating  a portion of the wound 8/18; I believe the patient has had follow-up arterial studies ordered by Dr. Donzetta Matters but I do not think they have been done and the patient and his friend are not aware of an appointment time. He has a mummified left second toe and a small area on the left posterior calf in the mid aspect. This substantial area is on the left Achilles area extending into  the left lateral heel. This is a deep necrotic wound. It no longer has the black gangrenous surface. His friend says that there is still a malodorous discharge. I note that Dr. Evette Doffing did a culture of this area on 8/12. This grew Enterococcus faecalis and E. coli. He is started on Augmentin although the E. coli only has intermediate sensitivities here. He was referred to infectious disease. We could add either a first or third generation cephalosporin which should cover the E. coli although I did not do this today. 8/25; the patient has a mummified left second toe a substantial wound over the left Achilles area with exposed tendon. And a small area on the left posterior calf. He has been revascularized by Dr. Donzetta Matters in July. He has not made it for his follow-up noninvasive studies. X-ray I did of the left heel last week showed soft tissue gas along the posterior aspect of the heel and soft tissue defect and findings suspicious for osteomyelitis involving the distal aspect of the second proximal phalanx. This is the mummified toe. The patient went to see Dr. Linus Salmons of infectious disease. I am not able to really follow the flow of his note but according to when I am seeing he did not add or suggest any additional antibiotics unless he called these into dialysis. Objective Constitutional Sitting or standing Blood Pressure is within target range for patient.. Pulse regular and within target range for patient.Marland Kitchen Respirations regular, non-labored and within target range.. Temperature is normal and within the target range for the patient.Marland Kitchen Appears in no distress. Vitals Time Taken: 5:08 PM, Height: 69 in, Weight: 170 lbs, BMI: 25.1, Temperature: 98.4 F, Pulse: 88 bpm, Respiratory Rate: 18 breaths/min, Blood Pressure: 118/64 mmHg. Respiratory work of breathing is normal. Cardiovascular Trouble feeling popliteal or femoral pulses. No pedal pulses on the left. Psychiatric appears at normal  baseline. General Notes: Wound exam; the substantial area on the left Achilles has exposed tendon. The tendon itself looks like it is deteriorating. The patient is nonambulatory and I would not think it would be unreasonable to sacrifice this at some point. There is no overt infection here. ooSmall draining area on the left mid posterior calf. ooMummified left second toe Integumentary (Hair, Skin) No overt infection although the left foot is dusky and dark. Wound #1 status is Open. Original cause of wound was Gradually Appeared. The wound is located on the Left,Lateral Calcaneus. The wound measures 6cm length x 14cm width x 0.6cm depth; 65.973cm^2 area and 39.584cm^3 volume. There is muscle, tendon, and Fat Layer (Subcutaneous Tissue) Exposed exposed. There is no tunneling or undermining noted. There is a medium amount of purulent drainage noted. Foul odor after cleansing was noted. The wound margin is flat and intact. There is small (1-33%) red granulation within the wound bed. There is a large (67-100%) amount of necrotic tissue within the wound bed including Eschar, Adherent Slough and Necrosis of Muscle. Wound #2 status is Open. Original cause of wound was Pressure Injury. The wound is located on the Left Toe Second. The wound measures 2.4cm  length x 5cm width x 0.1cm depth; 9.425cm^2 area and 0.942cm^3 volume. There is Fat Layer (Subcutaneous Tissue) Exposed exposed. There is no tunneling or undermining noted. There is a small amount of purulent drainage noted. The wound margin is distinct with the outline attached to the wound base. There is no granulation within the wound bed. There is a large (67-100%) amount of necrotic tissue within the wound bed including Eschar. Wound #3 status is Open. Original cause of wound was Gradually Appeared. The wound is located on the Left,Posterior Lower Leg. The wound measures 0.7cm length x 1.2cm width x 0.1cm depth; 0.66cm^2 area and 0.066cm^3  volume. There is Fat Layer (Subcutaneous Tissue) Exposed exposed. There is no tunneling or undermining noted. There is a small amount of serosanguineous drainage noted. The wound margin is flat and intact. There is large (67-100%) red granulation within the wound bed. There is a small (1-33%) amount of necrotic tissue within the wound bed including Adherent Slough. 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 Dressing Change Frequency: Wound #1 Left,Lateral Calcaneus: Change dressing three times week. Wound #2 Left Toe Second: Change dressing three times week. Wound Cleansing: Wound #1 Left,Lateral Calcaneus: Clean wound with Wound Cleanser - or normal saline Wound #2 Left Toe Second: Clean wound with Wound Cleanser - or normal saline Wound #3 Left,Posterior Lower Leg: Clean wound with Wound Cleanser - or normal saline Primary Wound Dressing: Wound #1 Left,Lateral Calcaneus: Silver Collagen - moisten with normal saline, KY jelly or hydrogel Wound #2 Left Toe Second: Silver Collagen - moisten with normal saline, KY jelly or hydrogel Wound #3 Left,Posterior Lower Leg: Silver Collagen - moisten with normal saline, KY jelly or hydrogel Secondary Dressing: Wound #1 Left,Lateral Calcaneus: Kerlix/Rolled Gauze Dry Gauze Off-Loading: Other: - float feet off of bed/chair by placing pillow under legs Home Health: [facility] skilled nursing for wound care. - [facility] Consults ordered were: Vascular - patient to follow up with Dr Donzetta Matters 1. Change the dressing to silver  collagen to the substantial area on the left Achilles area as well as the left posterior calf. 2. Mummified left second toe had osteomyelitis by the x-ray I did but that is not really the issue. 3. Dr. Linus Salmons did not add to his antibiotics. He had 2 weeks of Augmentin which I think can stop 4. He needs follow-up vascular studies noninvasive studies and all have our staff call vein and vascular tomorrow to set these up. I am not sure about the success of the recent revascularization. I cannot feel pulses in his foot and things still look ischemic to me. 5. I am still doubtful that this is going to heal Electronic Signature(s) Signed: 09/02/2018 6:47:38 PM By: Linton Ham MD Entered By: Linton Ham on 09/02/2018 18:26:46 -------------------------------------------------------------------------------- SuperBill Details Patient Name: Date of Service: Nathaniel White 09/02/2018 Medical Record [id number] Patient Account Number: [id number] Date of Birth/Sex: Treating RN: 10-30-1960 (58 y.o. Nathaniel White Primary Care Provider: Geryl Rankins Other Clinician: Referring Provider: Treating Provider/Extender:Jakiya Bookbinder, Rodrigo Ran, Paulette Blanch in Treatment: 9 Diagnosis Coding ICD-10 Codes Code Description E11.621 Type 2 diabetes mellitus with foot ulcer E11.52 Type 2 diabetes mellitus with diabetic  peripheral angiopathy with gangrene I87.333 Chronic venous hypertension (idiopathic) with ulcer and inflammation of bilateral lower extremity L97.423 Non-pressure chronic ulcer of left heel and midfoot with necrosis of muscle L97.221 Non-pressure chronic ulcer of left calf limited to breakdown of skin Facility Procedures CPT4 Code: [id number] Description: 67209 - WOUND CARE VISIT-LEV 5 EST PT Modifier: Quantity: 1 Physician Procedures CPT4 Code Description: [id number] [place] - WC PHYS LEVEL 3 - EST PT ICD-10 Diagnosis Description L97.423 Non-pressure chronic ulcer of left heel and  midfoot wi L97.221 Non-pressure chronic ulcer of left calf limited to bre E11.621 Type 2 diabetes mellitus  with foot ulcer Modifier: th necrosis of m akdown of skin Quantity: 1 uscle Electronic Signature(s) Signed: 09/02/2018 6:47:38 PM By: Linton Ham MD Signed: 12/16/2018 3:07:45 PM By: Carlene Coria RN Entered By: Carlene Coria on 09/02/2018 18:32:50

## 2018-12-16 NOTE — Progress Notes (Signed)
OLAWALE, MARNEY ([id number]) Visit Report for 08/19/2018 Debridement Details Patient Name: Date of Service: Nathaniel White, Nathaniel White 08/19/2018 4:00 PM Medical Record [id number] Patient Account Number: [id number] Date of Birth/Sex: Treating RN: 09-May-1960 (57 y.o. Nathaniel White) Carlene Coria Primary Care Provider: Geryl Rankins Other Clinician: Referring Provider: Treating Provider/Extender:Madduri, Lovena Le, [name] in Treatment: 7 Debridement Performed for Wound #1 Left,Lateral Calcaneus Assessment: Performed By: Physician Tobi Bastos, MD Debridement Type: Debridement Severity of Tissue Pre Fat layer exposed Debridement: Level of Consciousness (Pre- Awake and Alert procedure): Pre-procedure Verification/Time Out Taken: Yes - 17:15 Start Time: 17:15 Pain Control: Lidocaine 5% topical ointment Total Area Debrided (L x W): 4 (cm) x 4 (cm) = 16 (cm) Tissue and other material Viable, Non-Viable, Slough, Subcutaneous, Slough debrided: Level: Skin/Subcutaneous Tissue Debridement Description: Excisional Instrument: Blade, Forceps Bleeding: Minimum Hemostasis Achieved: Pressure End Time: 17:22 Procedural Pain: 0 Post Procedural Pain: 0 Response to Treatment: Procedure was tolerated well Level of Consciousness Awake and Alert (Post-procedure): Post Debridement Measurements of Total Wound Length: (cm) 6 Width: (cm) 13.5 Depth: (cm) 0.5 Volume: (cm) 31.809 Character of Wound/Ulcer Post Improved Debridement: Severity of Tissue Post Debridement: Fat layer exposed Post Procedure Diagnosis Same as Pre-procedure Electronic Signature(s) Signed: 08/19/2018 5:39:09 PM By: Tobi Bastos Signed: 12/16/2018 3:07:45 PM By: Carlene Coria RN Entered By: Carlene Coria on 08/19/2018 17:38:03 -------------------------------------------------------------------------------- HPI Details Patient Name: Date of Service: Nathaniel White 08/19/2018 4:00 PM Medical Record [id number]  Patient Account Number: [id number] Date of Birth/Sex: Treating RN: 26-Apr-1960 (57 y.o. Nathaniel White Primary Care Provider: Geryl Rankins Other Clinician: Referring Provider: Treating Provider/Extender:Madduri, Lovena Le, [name] in Treatment: 7 History of Present Illness HPI Description: ADMISSION 06/30/2018 The patient is a 58 year old man with multiple medical issues including type 2 diabetes on dialysis. Currently the patient his dialysis is actually caused by nephrosclerosis and he has been on dialysis for 20 years. Roughly a month ago according to the patient he noticed a small open area on his left heel he showed it to his caregiver and according to her this rapidly expanded. Looking through [facility] health link he was in the ER with left foot pain on 4/3. An x-ray was negative noting only pedal vessel atherosclerosis. He was also seen on 4/22 at that point he was felt to have a cellulitis and abscess of the left foot he was given Silvadene. After her third visit to the ER and a visit to podiatry [Dr. Price] he was referred here. He has been using Silvadene. The patient thinks that he was bitten by something initially, but he did not actually see this The patient has a history of type 2 diabetes although he is not currently on anything for this and his hemoglobin A1c was 5; COPD, dilated cardiomyopathy, mitral regurgitation, CHF, remote CVA with left-sided hemiparesis, hypertension ABI in our clinic was not easy to obtain although we were able to get 0.93 on the left 6/29; I did a debridement on this last time however once again it he comes in with a mostly black eschar at surface. Very tender. We have not yet managed to get arterial studies on him. I will see if I can get them done through Dr. Kennon Holter office 7/9; his arterial studies were done on 7/1. On the right side his studies are not too bad with noncompressible ABIs at the PTA great TBI of 0.64 and monophasic waveforms  on the left however an ABI could not be obtained at the ATA, at the PTA his ABI  was 0.2 with a dampened monophasic waveforms there was no pressure in the great toe. He is developed a gangrenous-looking second toes since the last time I saw this patient. There is an ulcer on the medial aspect the second toe. The area on his heel is declining 7/21; since the patient was last here he was revascularized by Dr. Donzetta Matters on 07/21/2018. Noteworthy on the left that the SFA was approximately 50% stenosed at the takeoff but this was not treated. Popliteal artery had heavy areas of calcification and included in one segment and reconstituted just beyond this. This area was stented. After stenting there was 0% residual stenosis. It was noted he appeared to have three-vessel runoff via the tibials which are heavily calcified but without focal occlusions. The patient does not complain of pain. The area on the left lateral heel is a deep necrotic wound it does not look any more viable this week. He has a gangrenous second toe which I do not think is going to be viable. Finally he has a new area on the left posterior calf 7/28; patient arrives with the mummified second toe. The large necrotic wound on the left lateral calcaneus is separating black eschar. He had a new area superiorly on the posterior calf. Home health could not get Iodoflex and he arrived with calcium alginate. He has been revascularized by Dr. Donzetta Matters patient is not aware when his follow-up appointment is. 8/4-Patient is back to the clinic with the mummified second toe on the left, left calcaneal ulcer with black eschar and visible tendon. His appointment with Dr. Gwenlyn Saran is on the 14th of this month. I have indicated to him that this is important to keep 8/11-8/11-Patient comes back for his left medial ankle wound, the more distal wound is bigger, has a lot of necrotic tissue, greenish drainage, eschar coating a portion of the wound Electronic  Signature(s) Signed: 08/19/2018 5:25:10 PM By: Tobi Bastos Entered By: Tobi Bastos on 08/19/2018 17:25:10 -------------------------------------------------------------------------------- Physical Exam Details Patient Name: Date of Service: Nathaniel White, Nathaniel White 08/19/2018 4:00 PM Medical Record [id number] Patient Account Number: [id number] Date of Birth/Sex: Treating RN: Mar 26, 1960 (57 y.o. Nathaniel White Primary Care Provider: Geryl Rankins Other Clinician: Referring Provider: Treating Provider/Extender:Madduri, Lovena Le, [name] in Treatment: 7 Constitutional alert and oriented x 3. sitting or standing blood pressure is within target range for patient.. supine blood pressure is within target range for patient.. pulse regular and within target range for patient.Marland Kitchen respirations regular, non-labored and within target range for patient.Marland Kitchen temperature within target range for patient.. . . Well-nourished and well-hydrated in no acute distress. Notes The left ankle wound is worse looking, malodorous drainage, there is dense coating of necrotic greenish tissue but was removed partly with scalpel and forceps, including removal of an eschar covering a portion of the wound, this obviously looks more infected cultures were also done Electronic Signature(s) Signed: 08/19/2018 5:27:28 PM By: Tobi Bastos Entered By: Tobi Bastos on 08/19/2018 17:27:27 -------------------------------------------------------------------------------- Physician Orders Details Patient Name: Date of Service: Nathaniel White 08/19/2018 4:00 PM Medical Record [id number] Patient Account Number: [id number] Date of Birth/Sex: Treating RN: November 10, 1960 (57 y.o. 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 Dressing Change Frequency Wound #1 Left,Lateral Calcaneus Change Dressing every other day. Wound #2 Left Toe Second Change Dressing every other day. Wound Cleansing Wound #1 Left,Lateral Calcaneus Clean wound with Wound Cleanser - or normal saline Wound #2 Left Toe Second Clean wound with Wound Cleanser - or normal saline Wound #3 Left,Posterior Lower Leg Clean wound with Wound Cleanser - or normal saline Primary Wound Dressing Wound #1 Left,Lateral Calcaneus Calcium Alginate with Silver Wound #2 Left Toe Second Calcium Alginate with Silver Wound #3 Left,Posterior Lower Leg Calcium Alginate with Silver Secondary Dressing Wound #1 Left,Lateral Calcaneus Kerlix/Rolled Gauze Dry Gauze Off-Loading Other: - float feet off of bed/chair by placing pillow under legs [place] skilled nursing for wound care. - [facility] Bacteria identified in Unspecified specimen by Anaerobe culture (MICRO) - non healing wound left foot - (ICD10 E11.52 - Type 2 diabetes mellitus with diabetic peripheral angiopathy with gangrene) LOINC Code: 635-3 Convenience Name: Anerobic culture Patient Medications Allergies: codeine Notifications Medication Indication Start End Augmentin 08/22/2018 DOSE oral 875 mg-125 mg tablet - 1 tablet oral twice daily x 7 days Electronic Signature(s) Signed: 08/22/2018 3:19:13 PM By: Tobi Bastos MD, MBA Previous Signature: 08/21/2018 5:06:20 PM Version By: Tobi Bastos Previous Signature: 08/19/2018 5:39:09 PM Version By: Tobi Bastos Entered By: Tobi Bastos on 08/22/2018 15:19:12 -------------------------------------------------------------------------------- Problem List Details Patient Name: Date of Service: Nathaniel White 08/19/2018 4:00 PM Medical Record [id number] Patient Account Number: [id number] Date of Birth/Sex: Treating RN: 07-01-1960 (57 y.o. Nathaniel White Primary Care Provider: Geryl Rankins Other Clinician: Referring Provider: Treating Provider/Extender:Madduri, Lovena Le, [name] in Treatment: 7 Active Problems ICD-10 Evaluated Encounter Code Description Active Date Today Diagnosis E11.621 Type 2 diabetes mellitus with foot ulcer 06/30/2018 No Yes E11.52 Type 2 diabetes mellitus with diabetic peripheral 07/17/2018 No Yes angiopathy with gangrene I87.333 Chronic venous hypertension (idiopathic) with ulcer 06/30/2018 No Yes and inflammation of bilateral lower extremity L97.423 Non-pressure chronic ulcer of left heel and midfoot 07/29/2018 No Yes with necrosis of muscle L97.221 Non-pressure chronic ulcer of left calf limited to 07/29/2018 No Yes breakdown of skin Inactive Problems ICD-10 Code Description Active Date Inactive Date E11.51 Type 2 diabetes mellitus with diabetic peripheral angiopathy 06/30/2018 06/30/2018 without gangrene Resolved Problems Electronic Signature(s) Signed: 08/19/2018 5:39:09 PM By: Tobi Bastos Signed: 12/16/2018 3:07:45 PM By: Carlene Coria RN Entered By: Carlene Coria on 08/19/2018 17:13:01 -------------------------------------------------------------------------------- Progress Note Details Patient Name: Date of Service: Nathaniel White 08/19/2018 4:00 PM Medical Record [id number] Patient Account Number: [id number] Date of Birth/Sex: Treating RN: 1960/07/29 (57 y.o. Nathaniel White Primary Care Provider: Geryl Rankins Other Clinician: Referring Provider:  Treating Provider/Extender:Madduri, Lovena Le, [name] in Treatment: 7 Subjective History of Present Illness (HPI) ADMISSION 06/30/2018 The patient is a 58 year old man with multiple medical issues including type 2 diabetes on dialysis. Currently the patient his dialysis is actually caused by nephrosclerosis and he has been on dialysis for 20 years. Roughly a month ago according to the patient he noticed a small open area on his left heel he showed it to his caregiver and according to her this rapidly expanded. Looking through [facility] health link he was in the ER with left foot pain on 4/3. An x-ray was negative noting only pedal vessel atherosclerosis. He was also  seen on 4/22 at that point he was felt to have a cellulitis and abscess of the left foot he was given Silvadene. After her third visit to the ER and a visit to podiatry [Dr. Price] he was referred here. He has been using Silvadene. The patient thinks that he was bitten by something initially, but he did not actually see this The patient has a history of type 2 diabetes although he is not currently on anything for this and his hemoglobin A1c was 5; COPD, dilated cardiomyopathy, mitral regurgitation, CHF, remote CVA with left-sided hemiparesis, hypertension ABI in our clinic was not easy to obtain although we were able to get 0.93 on the left 6/29; I did a debridement on this last time however once again it he comes in with a mostly black eschar at surface. Very tender. We have not yet managed to get arterial studies on him. I will see if I can get them done through Dr. Kennon Holter office 7/9; his arterial studies were done on 7/1. On the right side his studies are not too bad with noncompressible ABIs at the PTA great TBI of 0.64 and monophasic waveforms on the left however an ABI could not be obtained at the ATA, at the PTA his ABI was 0.2 with a dampened monophasic waveforms there was no pressure in the great toe. He is  developed a gangrenous-looking second toes since the last time I saw this patient. There is an ulcer on the medial aspect the second toe. The area on his heel is declining 7/21; since the patient was last here he was revascularized by Dr. Donzetta Matters on 07/21/2018. Noteworthy on the left that the SFA was approximately 50% stenosed at the takeoff but this was not treated. Popliteal artery had heavy areas of calcification and included in one segment and reconstituted just beyond this. This area was stented. After stenting there was 0% residual stenosis. It was noted he appeared to have three-vessel runoff via the tibials which are heavily calcified but without focal occlusions. The patient does not complain of pain. The area on the left lateral heel is a deep necrotic wound it does not look any more viable this week. He has a gangrenous second toe which I do not think is going to be viable. Finally he has a new area on the left posterior calf 7/28; patient arrives with the mummified second toe. The large necrotic wound on the left lateral calcaneus is separating black eschar. He had a new area superiorly on the posterior calf. Home health could not get Iodoflex and he arrived with calcium alginate. He has been revascularized by Dr. Donzetta Matters patient is not aware when his follow-up appointment is. 8/4-Patient is back to the clinic with the mummified second toe on the left, left calcaneal ulcer with black eschar and visible tendon. His appointment with Dr. Gwenlyn Saran is on the 14th of this month. I have indicated to him that this is important to keep 8/11-8/11-Patient comes back for his left medial ankle wound, the more distal wound is bigger, has a lot of necrotic tissue, greenish drainage, eschar coating a portion of the wound Objective Constitutional alert and oriented x 3. sitting or standing blood pressure is within target range for patient.. supine blood pressure is within target range for patient.. pulse  regular and within target range for patient.Marland Kitchen respirations regular, non-labored and within target range for patient.Marland Kitchen temperature within target range for patient.. Well-nourished and well-hydrated in no acute distress. Vitals Time Taken: 4:30 PM,  Height: 69 in, Weight: 170 lbs, BMI: 25.1, Temperature: 98.5 F, Pulse: 102 bpm, Respiratory Rate: 18 breaths/min, Blood Pressure: 115/98 mmHg. General Notes: The left ankle wound is worse looking, malodorous drainage, there is dense coating of necrotic greenish tissue but was removed partly with scalpel and forceps, including removal of an eschar covering a portion of the wound, this obviously looks more infected cultures were also done Integumentary (Hair, Skin) Wound #1 status is Open. Original cause of wound was Gradually Appeared. The wound is located on the Left,Lateral Calcaneus. The wound measures 6cm length x 13.5cm width x 0.5cm depth; 63.617cm^2 area and 31.809cm^3 volume. There is muscle, tendon, and Fat Layer (Subcutaneous Tissue) Exposed exposed. There is no tunneling or undermining noted. There is a large amount of serosanguineous drainage noted. Foul odor after cleansing was noted. The wound margin is flat and intact. There is small (1-33%) pink granulation within the wound bed. There is a large (67-100%) amount of necrotic tissue within the wound bed including Eschar, Adherent Slough and Necrosis of Muscle. Wound #2 status is Open. Original cause of wound was Pressure Injury. The wound is located on the Left Toe Second. The wound measures 2.3cm length x 5.2cm width x 0.1cm depth; 9.393cm^2 area and 0.939cm^3 volume. There is Fat Layer (Subcutaneous Tissue) Exposed exposed. There is no tunneling or undermining noted. There is a none present amount of drainage noted. The wound margin is distinct with the outline attached to the wound base. There is no granulation within the wound bed. There is a large (67-100%) amount of necrotic tissue  within the wound bed including Eschar. Wound #3 status is Open. Original cause of wound was Gradually Appeared. The wound is located on the Left,Posterior Lower Leg. The wound measures 1.1cm length x 1.6cm width x 0.1cm depth; 1.382cm^2 area and 0.138cm^3 volume. There is Fat Layer (Subcutaneous Tissue) Exposed exposed. There is no tunneling or undermining noted. There is a small amount of serosanguineous drainage noted. The wound margin is flat and intact. There is large (67-100%) red granulation within the wound bed. There is a small (1-33%) amount of necrotic tissue within the wound bed including Adherent Slough. 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 Dressing Change Frequency: Wound #1 Left,Lateral Calcaneus: Change Dressing every other day. Wound #2 Left Toe Second: Change Dressing every other day. Wound Cleansing: Wound #1 Left,Lateral Calcaneus: Clean wound with Wound Cleanser - or normal saline Wound #2 Left Toe Second: Clean wound with Wound Cleanser - or normal saline Wound #3 Left,Posterior Lower Leg: Clean wound with Wound Cleanser - or normal saline Primary Wound Dressing: Wound #1 Left,Lateral Calcaneus: Calcium Alginate with Silver Wound #2 Left Toe Second: Calcium Alginate with Silver Wound #3 Left,Posterior Lower Leg: Calcium Alginate with Silver Secondary Dressing: Wound #1 Left,Lateral Calcaneus: Kerlix/Rolled Gauze Dry Gauze Off-Loading: Other: - float feet off of bed/chair by placing pillow under legs Laboratory ordered were: Anerobic culture -  non healing wound left foot 1. The major wound on his left leg looks worse, will start him on a course of antibiotics, I feel that this wound is definitely not going to heal at this rate 2. We will continue  using silver alginate dressing 3. Cultures were done for the wound today 4. Return to clinic next week Electronic Signature(s) Signed: 08/19/2018 5:28:25 PM By: Tobi Bastos Entered By: Tobi Bastos on 08/19/2018 17:28:25 -------------------------------------------------------------------------------- SuperBill Details Patient Name: Date of Service: Nathaniel White 08/19/2018 Medical Record [id number] Patient Account Number: [id number] Date of Birth/Sex: Treating RN: 1960-10-17 (57 y.o. 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 of muscle L97.221 Non-pressure chronic ulcer of left calf limited to breakdown of skin Facility Procedures CPT4 Code Description: [id number] 11042 - DEB SUBQ TISSUE 20 SQ CM/< ICD-10 Diagnosis Description L97.423 Non-pressure chronic ulcer of left heel and midfoot with Modifier: necrosis of mus Quantity: 1 cle Physician Procedures CPT4 Code Description: [id number] 11042 - WC PHYS SUBQ TISS 20 SQ CM ICD-10 Diagnosis Description L97.423 Non-pressure chronic ulcer of left heel and midfoot with Modifier: necrosis of musc Quantity: 1 le Electronic Signature(s) Signed: 08/21/2018 5:06:20 PM By: Tobi Bastos Signed: 12/16/2018 3:07:45 PM By: Carlene Coria RN Previous  Signature: 08/19/2018 5:28:54 PM Version By: Tobi Bastos Entered By: Carlene Coria on 08/20/2018 10:59:35

## 2018-12-16 NOTE — Progress Notes (Signed)
Nathaniel White ([id number]) Visit Report for 10/24/2018 Arrival Information Details Patient Name: Date of Service: Nathaniel White, Nathaniel White 10/24/2018 3:00 PM Medical Record [id number] Patient Account Number: [id number] Date of Birth/Sex: Treating RN: 11/09/1960 (58 y.o. Nathaniel White Primary Care Nalia Honeycutt: Geryl Rankins Other Clinician: Referring Zya Finkle: Treating Charnese Federici/Extender:Robson, Rodrigo Ran, Paulette Blanch in Treatment: 16 Visit Information History Since Last Visit Added or deleted any medications: No Patient Arrived: Wheel Chair Any new allergies or adverse reactions: No Arrival Time: 15:31 Had a fall or experienced change in No activities of daily living that may affect Accompanied By: self risk of falls: Transfer Assistance: None Signs or symptoms of abuse/neglect since last visito No Patient Requires Transmission-Based No Hospitalized since last visit: No Precautions: Implantable device outside of the clinic excluding No Patient Has Alerts: No cellular tissue based products placed in the center since last visit: Pain Present Now: No Electronic Signature(s) Signed: 10/28/2018 6:16:57 PM By: Kela Millin Entered By: Kela Millin on 10/24/2018 15:32:20 -------------------------------------------------------------------------------- Lower Extremity Assessment Details Patient Name: Date of Service: Nathaniel White 10/24/2018 3:00 PM Medical Record [id number] Patient Account Number: [id number] Date of Birth/Sex: Treating RN: 07-Feb-1960 (58 y.o. Nathaniel White Primary Care Kerensa Nicklas: Geryl Rankins Other Clinician: Referring Anuj Summons: Treating [place] Retana/Extender:Robson, Rodrigo Ran, [name] in Treatment: 16 Edema Assessment Assessed: [Left: No] [Right: No] Edema: [Left: Ye] [Right: s] Calf Left: Right: Point of Measurement: 40 cm From Medial Instep 39 cm cm Ankle Left: Right: Point of Measurement: 10 cm From Medial Instep  26 cm cm Electronic Signature(s) Signed: 12/16/2018 3:01:15 PM By: Carlene Coria RN Entered By: Carlene Coria on 10/24/2018 15:46:57 -------------------------------------------------------------------------------- Multi Wound Chart Details Patient Name: Date of Service: Nathaniel Damme. 10/24/2018 3:00 PM Medical Record [id number] Patient Account Number: [id number] Date of Birth/Sex: Treating RN: [birth year] (58 y.o. 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] [2:Lower Extremity] Comorbid History: [1:Chronic Obstructive Pulmonary Disease (COPD), Congestive Heart (COPD), Congestive Heart Failure, Hypertension, Type Failure, Hypertension, Type II Diabetes, End Stage Renal Disease] [2:Chronic Obstructive Pulmonary Disease II  Diabetes, End Stage Renal Disease] [N/A:N/A] Date Acquired: [1:06/02/2018] [2:07/17/2018] [N/A:N/A] Weeks of Treatment: [1:16] [2:14] [N/A:N/A] Wound Status: [1:Open] [2:Open] [N/A:N/A] Measurements L x W x D 4.5x12x0.4 [2:2.5x6x0.4] [N/A:N/A] (cm) Area (cm) : [1:42.412] [2:11.781] [N/A:N/A] Volume (cm) : [1:16.965] [2:4.712] [N/A:N/A] % Reduction in Area: [1:-35.00%] [2:-1289.30%] [N/A:N/A] % Reduction in Volume: -80.00% [2:-5443.50%] [N/A:N/A] Classification: [1:Grade 4] [2:Grade 4] [N/A:N/A] Exudate Amount: [1:Medium] [2:Medium] [N/A:N/A] Exudate Type: [1:Serosanguineous]  [2:Serosanguineous] [N/A:N/A] Exudate Color: [1:red, brown] [2:red, brown] [N/A:N/A] Wound Margin: [1:Well defined, not attached] [2:Well defined, not attached] [N/A:N/A] Granulation Amount: [1:Medium (34-66%)] [2:None Present (0%)] [N/A:N/A] Granulation Quality: [1:Red] [2:N/A] [N/A:N/A] Necrotic Amount: [1:Medium (34-66%)] [2:Large (67-100%)] [N/A:N/A] Necrotic Tissue: [1:Eschar, Adherent Slough] [2:Eschar, Adherent Slough] [N/A:N/A] Exposed Structures: [1:Fat Layer (Subcutaneous Tissue) Exposed: Yes Tendon: Yes Muscle: Yes Fascia: No Joint: No Bone: No] [2:Tendon: Yes Bone: Yes Fascia: No Fat Layer (Subcutaneous Tissue) Exposed: No Muscle: No Joint: No] [N/A:N/A] Epithelialization: [1:None] [2:None] [N/A:N/A] Debridement: [1:Debridement - Excisional] [2:N/A] [N/A:N/A] Pre-procedure [1:16:07] [2:N/A] [N/A:N/A] Verification/Time Out Taken: Tissue Debrided: [1:Subcutaneous, Slough] [2:N/A] [N/A:N/A]  Level: [1:Skin/Subcutaneous Tissue] [2:N/A] [N/A:N/A] Debridement Area (sq cm):54 [2:N/A] [N/A:N/A] Instrument: [1:Curette] [2:N/A] [N/A:N/A] Bleeding: [1:Moderate] [2:N/A] [N/A:N/A] Hemostasis Achieved: [1:Pressure] [2:N/A] [N/A:N/A] Procedural Pain: [1:3] [2:N/A] [N/A:N/A] Post Procedural Pain: [1:0] [2:N/A] [N/A:N/A] Debridement Treatment Procedure was tolerated [2:N/A] [N/A:N/A] Response: [1:well] Post Debridement [1:4.5x12x0.4] [2:N/A] [N/A:N/A] Measurements L x W x D (cm) Post Debridement [1:16.965] [2:N/A] [N/A:N/A] Volume: (cm) Procedures Performed: Debridement [2:N/A] [N/A:N/A] Treatment Notes Electronic Signature(s) Signed: 10/24/2018 5:33:39 PM By: Linton Ham MD Signed: 10/24/2018 6:00:55 PM By: Levan Hurst RN, BSN Entered By: Linton Ham on 10/24/2018 17:04:21 -------------------------------------------------------------------------------- Multi-Disciplinary Care Plan Details Patient Name: Date of Service: Nathaniel Damme. 10/24/2018 3:00 PM Medical Record  [id number] Patient Account Number: [id number] Date of Birth/Sex: Treating RN: 1960/06/09 (58 y.o. Nathaniel White Primary Care Adeleine Pask: Geryl Rankins Other Clinician: Referring Kinleigh Nault: Treating Macauley Mossberg/Extender:Robson, Rodrigo Ran, Paulette Blanch in Treatment: 16 Active Inactive Tissue Oxygenation Nursing Diagnoses: Actual ineffective tissue perfusion; peripheral (select once diagnosis is confirmed) Goals: Patient/caregiver will verbalize understanding of disease process and disease management Date Initiated: 10/09/2018 Target Resolution Date: 11/14/2018 Goal Status: Active Interventions: Assess patient understanding of disease process and management upon diagnosis and as needed Provide education on tissue oxygenation and ischemia Treatment Activities: Ankle Brachial Index (ABI) : 10/03/2018 Test ordered outside of clinic : 10/09/2018 Notes: Electronic Signature(s) Signed: 10/24/2018 6:00:55 PM By: Levan Hurst RN, BSN Entered By: Levan Hurst on 10/24/2018 17:55:52 -------------------------------------------------------------------------------- Pain Assessment Details Patient Name: Date of Service: LAJARVIS, ITALIANO 10/24/2018 3:00 PM Medical Record [id number] Patient Account Number: [id number] Date of Birth/Sex: Treating RN: [birth year] (58 y.o. Nathaniel White Primary Care Alonie Gazzola: Geryl Rankins Other Clinician: Referring Koleton Duchemin: Treating Adalyna Godbee/Extender:Robson, Rodrigo Ran, Paulette Blanch in Treatment: 16 Active Problems Location of Pain Severity and Description of Pain Patient Has Paino No Site Locations Pain Management and Medication Current Pain Management: Electronic Signature(s) Signed: 10/24/2018 6:00:55 PM By: Levan Hurst RN, BSN Signed: 12/16/2018 3:02:15 PM By: Sandre Kitty Entered By: Sandre Kitty on 10/24/2018 15:34:16 -------------------------------------------------------------------------------- Patient/Caregiver  Education Details Patient Name: Date of Service: Nathaniel Damme 10/16/2020andnbsp3:00 PM Medical Record [phone] Patient Account Number: [id number] Date of Birth/Gender: Treating RN: 01-Jul-1960 (58 y.o. Nathaniel White Primary Care Physician: Geryl Rankins Other Clinician: Referring Physician: Treating Physician/Extender:Robson, Rodrigo Ran, Paulette Blanch in Treatment: 16 Education Assessment Education Provided To: Patient Education Topics Provided Wound/Skin Impairment: Methods: Explain/Verbal Responses: State content correctly Electronic Signature(s) Signed: 10/24/2018 6:00:55 PM By: Levan Hurst RN, BSN Entered By: Levan Hurst on 10/24/2018 17:56:01 -------------------------------------------------------------------------------- Wound Assessment Details Patient Name: Date of Service: Nathaniel Damme 10/24/2018 3:00 PM Medical Record [id number] Patient Account Number: [id number] Date of Birth/Sex: Treating RN: [birth year] (58 y.o. Jerilynn Mages) Carlene Coria Primary Care Kimiyah Blick: Geryl Rankins Other Clinician: Referring Erickson Yamashiro: Treating Deniro Laymon/Extender:Robson, Rodrigo Ran, [name] in Treatment: 16 Wound Status Wound Number: 1 Primary Diabetic Wound/Ulcer of the Lower Extremity Etiology: Wound Location: Left Calcaneus - Lateral Wound Open Wounding Event: Gradually Appeared Status: Date Acquired: 06/02/2018 Comorbid Chronic Obstructive Pulmonary Disease Weeks Of Treatment: 16 History: (COPD), Congestive Heart Failure, Clustered Wound: No Hypertension, Type II Diabetes, End Stage Renal Disease Photos Wound Measurements Length: (cm) 4.5 Width: (cm) 12 Depth: (cm) 0.4 Area: (cm) 42.412 Volume: (cm) 16.965 Wound Description Classification: Grade 4 Wound Margin: Well defined, not attached Exudate Amount: Medium Exudate Type: Serosanguineous Exudate Color: red, brown Wound Bed Granulation Amount: Medium (34-66%) Granulation  Quality: Red Necrotic Amount: Medium (34-66%) Necrotic Quality: Eschar, Adherent Slough fter Cleansing: No ino Yes Exposed Structure ed: No ubcutaneous Tissue)  Exposed: Yes ed: Yes ed: Yes s of Muscle: Yes d: No : No % Reduction in Area: -35% % Reduction in Volume: -80% Epithelialization: None Tunneling: No Undermining: No Foul Odor A Slough/Fibr Fascia Expos Fat Layer (S Tendon Expos Muscle Expos Necrosi Joint Expose Bone Exposed Electronic Signature(s) Signed: 10/28/2018 4:20:24 PM By: Mikeal Hawthorne EMT/HBOT Signed: 12/16/2018 3:01:15 PM By: Carlene Coria RN Entered By: Mikeal Hawthorne on 10/28/2018 14:17:19 -------------------------------------------------------------------------------- Wound Assessment Details Patient Name: Date of Service: Nathaniel Damme 10/24/2018 3:00 PM Medical Record [id number] Patient Account Number: [id number] Date of Birth/Sex: Treating RN: 1960/06/01 (58 y.o. Nathaniel White Primary Care Yolando Gillum: Geryl Rankins Other Clinician: Referring Michaelangelo Mittelman: Treating Jamonte Curfman/Extender:Robson, Rodrigo Ran, [name] in Treatment: 16 Wound Status Wound Number: 2 Primary Diabetic Wound/Ulcer of the Lower Extremity Etiology: Wound Location: Left Toe Second Wound Open Wounding Event: Pressure Injury Status: Date Acquired: 07/17/2018 Comorbid Chronic Obstructive Pulmonary Disease Weeks Of Treatment: 14 History: (COPD), Congestive Heart Failure, Clustered Wound: No Hypertension, Type II Diabetes, End Stage Renal Disease Photos Wound Measurements Length: (cm) 2.5 Width: (cm) 6 Depth: (cm) 0.4 Area: (cm) 11.781 Volume: (cm) 4.712 Wound Description Classification: Grade 4 Wound Margin: Well defined, not attached Exudate Amount: Medium Exudate Type: Serosanguineous Exudate Color: red, brown Wound Bed Granulation Amount: None Present (0%) Necrotic Amount: Large (67-100%) Necrotic Quality: Eschar, Adherent Slough fter  Cleansing: No ino Yes Exposed Structure ed: No ubcutaneous Tissue) Exposed: No ed: Yes ed: No d: No : Yes % Reduction in Area: -1289.3% % Reduction in Volume: -5443.5% Epithelialization: None Tunneling: No Undermining: No Foul Odor A Slough/Fibr Fascia Expos Fat Layer (S Tendon Expos Muscle Expos Joint Expose Bone Exposed Electronic Signature(s) Signed: 10/28/2018 4:20:24 PM By: Mikeal Hawthorne EMT/HBOT Signed: 12/16/2018 3:01:15 PM By: Carlene Coria RN Entered By: Mikeal Hawthorne on 10/28/2018 14:16:59 -------------------------------------------------------------------------------- Vitals Details Patient Name: Date of Service: Nathaniel Damme. 10/24/2018 3:00 PM Medical Record [id number] Patient Account Number: [id number] Date of Birth/Sex: Treating RN: [birth year] (58 y.o. Nathaniel White Primary Care Tyriek Hofman: Geryl Rankins Other Clinician: Referring Leaman Abe: Treating Marlise Fahr/Extender:Robson, Rodrigo Ran, [name] in Treatment: 16 Vital Signs Time Taken: 15:33 Temperature (F): 97.6 Height (in): 69 Pulse (bpm): 107 Weight (lbs): 170 Respiratory Rate (breaths/min): 18 Body Mass Index (BMI): 25.1 Blood Pressure (mmHg): 123/68 Reference Range: 80 - 120 mg / dl Electronic Signature(s) Signed: 12/16/2018 3:02:15 PM By: Sandre Kitty Entered By: Sandre Kitty on 10/24/2018 15:34:07

## 2018-12-16 NOTE — Progress Notes (Signed)
DONNEL, VENUTO ([id number]) Visit Report for 09/09/2018 HPI Details Patient Name: Date of Service: Nathaniel, White 09/09/2018 3:00 PM Medical Record [id number] Patient Account Number: [id number] Date of Birth/Sex: Treating RN: 01/29/1960 (58 y.o. Nathaniel White) Carlene Coria Primary Care Provider: Geryl Rankins Other Clinician: Referring Provider: Treating Provider/Extender:[name], Rodrigo Ran, Paulette Blanch in Treatment: 10 History of Present Illness HPI Description: ADMISSION 06/30/2018 The patient is a 58 year old man with multiple medical issues including type 2 diabetes on dialysis. Currently the patient his dialysis is actually caused by nephrosclerosis and he has been on dialysis for 20 years. Roughly a month ago according to the patient he noticed a small open area on his left heel he showed it to his caregiver and according to her this rapidly expanded. Looking through [facility] health link he was in the ER with left foot pain on 4/3. An x-ray was negative noting only pedal vessel atherosclerosis. He was also seen on 4/22 at that point he was felt to have a cellulitis and abscess of the left foot he was given Silvadene. After her third visit to the ER and a visit to podiatry [Dr. Price] he was referred here. He has been using Silvadene. The patient thinks that he was bitten by something initially, but he did not actually see this The patient has a history of type 2 diabetes although he is not currently on anything for this and his hemoglobin A1c was 5; COPD, dilated cardiomyopathy, mitral regurgitation, CHF, remote CVA with left-sided hemiparesis, hypertension ABI in our clinic was not easy to obtain although we were able to get 0.93 on the left 6/29; I did a debridement on this last time however once again it he comes in with a mostly black eschar at surface. Very tender. We have not yet managed to get arterial studies on him. I will see if I can get them done through Dr. Kennon Holter  office 7/9; his arterial studies were done on 7/1. On the right side his studies are not too bad with noncompressible ABIs at the PTA great TBI of 0.64 and monophasic waveforms on the left however an ABI could not be obtained at the ATA, at the PTA his ABI was 0.2 with a dampened monophasic waveforms there was no pressure in the great toe. He is developed a gangrenous-looking second toes since the last time I saw this patient. There is an ulcer on the medial aspect the second toe. The area on his heel is declining 7/21; since the patient was last here he was revascularized by Dr. Donzetta Matters on 07/21/2018. Noteworthy on the left that the SFA was approximately 50% stenosed at the takeoff but this was not treated. Popliteal artery had heavy areas of calcification and included in one segment and reconstituted just beyond this. This area was stented. After stenting there was 0% residual stenosis. It was noted he appeared to have three-vessel runoff via the tibials which are heavily calcified but without focal occlusions. The patient does not complain of pain. The area on the left lateral heel is a deep necrotic wound it does not look any more viable this week. He has a gangrenous second toe which I do not think is going to be viable. Finally he has a new area on the left posterior calf 7/28; patient arrives with the mummified second toe. The large necrotic wound on the left lateral calcaneus is separating black eschar. He had a new area superiorly on the posterior calf. Home health could not get Iodoflex and he  arrived with calcium alginate. He has been revascularized by Dr. Donzetta Matters patient is not aware when his follow-up appointment is. 8/4-Patient is back to the clinic with the mummified second toe on the left, left calcaneal ulcer with black eschar and visible tendon. His appointment with Dr. Gwenlyn Saran is on the 14th of this month. I have indicated to him that this is important to keep 8/11-8/11-Patient comes  back for his left medial ankle wound, the more distal wound is bigger, has a lot of necrotic tissue, greenish drainage, eschar coating a portion of the wound 8/18; I believe the patient has had follow-up arterial studies ordered by Dr. Donzetta Matters but I do not think they have been done and the patient and his friend are not aware of an appointment time. He has a mummified left second toe and a small area on the left posterior calf in the mid aspect. This substantial area is on the left Achilles area extending into the left lateral heel. This is a deep necrotic wound. It no longer has the black gangrenous surface. His friend says that there is still a malodorous discharge. I note that Dr. Evette Doffing did a culture of this area on 8/12. This grew Enterococcus faecalis and E. coli. He is started on Augmentin although the E. coli only has intermediate sensitivities here. He was referred to infectious disease. We could add either a first or third generation cephalosporin which should cover the E. coli although I did not do this today. 8/25; the patient has a mummified left second toe a substantial wound over the left Achilles area with exposed tendon. And a small area on the left posterior calf. He has been revascularized by Dr. Donzetta Matters in July. He has not made it for his follow-up noninvasive studies. X-ray I did of the left heel last week showed soft tissue gas along the posterior aspect of the heel and soft tissue defect and findings suspicious for osteomyelitis involving the distal aspect of the second proximal phalanx. This is the mummified toe. The patient went to see Dr. Linus Salmons of infectious disease. I am not able to really follow the flow of his note but according to when I am seeing he did not add or suggest any additional antibiotics unless he called these into dialysis. 9/1; I was able to review Dr. Henreitta Leber notes he did not feel any further antibiotics were indicated. I still do not see where the patient  had his postprocedure noninvasive studies. We have been using silver collagen to the substantial area on the left Achilles. This has exposed tendon. He has a mummified left second toe. I am doubtful there is any viable tissue here. The patient is nonambulatory. He has flexion contractures of his left hip and knee. Electronic Signature(s) Signed: 09/09/2018 6:24:47 PM By: Linton Ham MD Entered By: Linton Ham on 09/09/2018 18:10:26 -------------------------------------------------------------------------------- Physical Exam Details Patient Name: Date of Service: Camron, Monday 09/09/2018 3:00 PM Medical Record [id number] Patient Account Number: [id number] Date of Birth/Sex: Treating RN: May 08, 1960 (58 y.o. Nathaniel White Primary Care Provider: Geryl Rankins Other Clinician: Referring Provider: Treating Provider/Extender:Tieasha Larsen, Rodrigo Ran, [name] in Treatment: 10 Constitutional Sitting or standing Blood Pressure is within target range for patient.. Pulse regular and within target range for patient.Marland Kitchen Respirations regular, non-labored and within target range.. Temperature is normal and within the target range for the patient.Marland Kitchen Appears in no distress. Eyes Conjunctivae clear. No discharge.no icterus. Respiratory work of breathing is normal. Cardiovascular Pedal pulses are vibrant but not robust..  Integumentary (Hair, Skin) No evidence of infection around the wound. Psychiatric appears at normal baseline. Notes Wound exam; subtest central area on the left Achilles still has exposed tendon. There is no evidence of infection here. Surrounding tissue actually looks healthy. The left second toe has thick dry gangrenous-looking tissue. Small area on the left posterior calf I think this is actually better. Electronic Signature(s) Signed: 09/09/2018 6:24:47 PM By: Linton Ham MD Entered By: Linton Ham on 09/09/2018  18:13:01 -------------------------------------------------------------------------------- Physician Orders Details Patient Name: Date of Service: [name] 09/09/2018 3:00 PM Medical Record [id number] Patient Account Number: [id number] Date of Birth/Sex: Treating RN: [birth year] (58 y.o. Nathaniel White) Carlene Coria Primary Care Provider: Geryl Rankins Other Clinician: Referring Provider: Treating Provider/Extender:Rock Sobol, Rodrigo Ran, Paulette Blanch in Treatment: 10 Verbal / Phone Orders: No Diagnosis Coding ICD-10 Coding Code Description E11.621 Type 2 diabetes mellitus with foot ulcer E11.52 Type 2 diabetes mellitus with diabetic peripheral angiopathy with gangrene I87.333 Chronic venous hypertension (idiopathic) with ulcer and inflammation of bilateral lower extremity L97.423 Non-pressure chronic ulcer of left heel and midfoot with necrosis of muscle L97.221 Non-pressure chronic ulcer of left calf limited to breakdown of skin Follow-up Appointments Return Appointment in 1 week. Dressing Change Frequency Wound #1 Left,Lateral Calcaneus Change dressing three times week. Wound #2 Left Toe Second Change dressing three times week. Wound Cleansing Wound #1 Left,Lateral Calcaneus Clean wound with Wound Cleanser - or normal saline Wound #2 Left Toe Second Clean wound with Wound Cleanser - or normal saline Wound #3 Left,Posterior Lower Leg Clean wound with Wound Cleanser - or normal saline Primary Wound Dressing Wound #1 Left,Lateral Calcaneus Silver Collagen - moisten with normal saline, KY jelly or hydrogel Wound #2 Left Toe Second Silver Collagen - moisten with normal saline, KY jelly or hydrogel Wound #3 Left,Posterior Lower Leg Silver Collagen - moisten with normal saline, KY jelly or hydrogel Secondary Dressing Wound #1 Left,Lateral Calcaneus Kerlix/Rolled Gauze Dry Gauze Off-Loading Other: - float feet off of bed/chair by placing pillow under legs [place] skilled [facility] for wound care. - [facility] Electronic Signature(s) Signed: 09/09/2018 6:24:47 PM By: Linton Ham MD Signed: 12/16/2018 3:04:22 PM By: Carlene Coria RN Entered By: Carlene Coria on 09/09/2018 16:11:43 -------------------------------------------------------------------------------- Problem List Details Patient Name: Date of Service: [name] 09/09/2018 3:00 PM Medical Record [id number] Patient Account Number: [id number] Date of Birth/Sex: Treating RN: [birth year] (58 y.o. Nathaniel White, Nathaniel White Primary Care Provider: Geryl Rankins Other Clinician: Referring Provider: Treating Provider/Extender:Illana Nolting, Rodrigo Ran, Paulette Blanch in Treatment: 10 Active Problems ICD-10 Evaluated Encounter Code Description Active Date Today Diagnosis E11.621 Type 2 diabetes mellitus with foot ulcer 06/30/2018 No Yes E11.52 Type 2 diabetes mellitus with diabetic peripheral 07/17/2018 No Yes angiopathy with gangrene I87.333 Chronic venous hypertension (idiopathic) with ulcer 06/30/2018 No Yes and inflammation of bilateral lower extremity L97.423 Non-pressure chronic ulcer of left heel and midfoot 07/29/2018 No Yes with necrosis of muscle L97.221 Non-pressure chronic ulcer of left calf limited to 07/29/2018 No Yes breakdown of skin Inactive Problems ICD-10 Code Description Active Date Inactive Date E11.51 Type 2 diabetes mellitus with diabetic peripheral angiopathy 06/30/2018 06/30/2018 without gangrene Resolved Problems Electronic Signature(s) Signed: 09/09/2018 6:24:47 PM By: Linton Ham MD Entered By: Linton Ham on 09/09/2018 18:08:44 -------------------------------------------------------------------------------- Progress Note Details Patient Name: Date of Service: [name] 09/09/2018 3:00 PM Medical Record [id number] Patient Account Number: [id number] Date of Birth/Sex: Treating RN: 09-24-1960 (58 y.o. Nathaniel White Primary  Care Provider: Geryl Rankins Other Clinician: Referring  Provider: Treating Provider/Extender:Marilla Boddy, Rodrigo Ran, [name] in Treatment: 10 Subjective History of Present Illness (HPI) ADMISSION 06/30/2018 The patient is a 58 year old man with multiple medical issues including type 2 diabetes on dialysis. Currently the patient his dialysis is actually caused by nephrosclerosis and he has been on dialysis for 20 years. Roughly a month ago according to the patient he noticed a small open area on his left heel he showed it to his caregiver and according to her this rapidly expanded. Looking through [facility] health link he was in the ER with left foot pain on 4/3. An x-ray was negative noting only pedal vessel atherosclerosis. He was also seen on 4/22 at that point he was felt to have a cellulitis and abscess of the left foot he was given Silvadene. After her third visit to the ER and a visit to podiatry [Dr. Price] he was referred here. He has been using Silvadene. The patient thinks that he was bitten by something initially, but he did not actually see this The patient has a history of type 2 diabetes although he is not currently on anything for this and his hemoglobin A1c was 5; COPD, dilated cardiomyopathy, mitral regurgitation, CHF, remote CVA with left-sided hemiparesis, hypertension ABI in our clinic was not easy to obtain although we were able to get 0.93 on the left 6/29; I did a debridement on this last time however once again it he comes in with a mostly black eschar at surface. Very tender. We have not yet managed to get arterial studies on him. I will see if I can get them done through Dr. Kennon Holter office 7/9; his arterial studies were done on 7/1. On the right side his studies are not too bad with noncompressible ABIs at the PTA great TBI of 0.64 and monophasic waveforms on the left however an ABI could not be obtained at the ATA, at the PTA his ABI was 0.2 with a dampened  monophasic waveforms there was no pressure in the great toe. He is developed a gangrenous-looking second toes since the last time I saw this patient. There is an ulcer on the medial aspect the second toe. The area on his heel is declining 7/21; since the patient was last here he was revascularized by Dr. Donzetta Matters on 07/21/2018. Noteworthy on the left that the SFA was approximately 50% stenosed at the takeoff but this was not treated. Popliteal artery had heavy areas of calcification and included in one segment and reconstituted just beyond this. This area was stented. After stenting there was 0% residual stenosis. It was noted he appeared to have three-vessel runoff via the tibials which are heavily calcified but without focal occlusions. The patient does not complain of pain. The area on the left lateral heel is a deep necrotic wound it does not look any more viable this week. He has a gangrenous second toe which I do not think is going to be viable. Finally he has a new area on the left posterior calf 7/28; patient arrives with the mummified second toe. The large necrotic wound on the left lateral calcaneus is separating black eschar. He had a new area superiorly on the posterior calf. Home health could not get Iodoflex and he arrived with calcium alginate. He has been revascularized by Dr. Donzetta Matters patient is not aware when his follow-up appointment is. 8/4-Patient is back to the clinic with the mummified second toe on the left, left calcaneal ulcer with black eschar and visible tendon. His appointment with Dr.  Gwenlyn Saran is on the 14th of this month. I have indicated to him that this is important to keep 8/11-8/11-Patient comes back for his left medial ankle wound, the more distal wound is bigger, has a lot of necrotic tissue, greenish drainage, eschar coating a portion of the wound 8/18; I believe the patient has had follow-up arterial studies ordered by Dr. Donzetta Matters but I do not think they have been done and  the patient and his friend are not aware of an appointment time. He has a mummified left second toe and a small area on the left posterior calf in the mid aspect. This substantial area is on the left Achilles area extending into the left lateral heel. This is a deep necrotic wound. It no longer has the black gangrenous surface. His friend says that there is still a malodorous discharge. I note that Dr. Evette Doffing did a culture of this area on 8/12. This grew Enterococcus faecalis and E. coli. He is started on Augmentin although the E. coli only has intermediate sensitivities here. He was referred to infectious disease. We could add either a first or third generation cephalosporin which should cover the E. coli although I did not do this today. 8/25; the patient has a mummified left second toe a substantial wound over the left Achilles area with exposed tendon. And a small area on the left posterior calf. He has been revascularized by Dr. Donzetta Matters in July. He has not made it for his follow-up noninvasive studies. X-ray I did of the left heel last week showed soft tissue gas along the posterior aspect of the heel and soft tissue defect and findings suspicious for osteomyelitis involving the distal aspect of the second proximal phalanx. This is the mummified toe. The patient went to see Dr. Linus Salmons of infectious disease. I am not able to really follow the flow of his note but according to when I am seeing he did not add or suggest any additional antibiotics unless he called these into dialysis. 9/1; I was able to review Dr. Henreitta Leber notes he did not feel any further antibiotics were indicated. I still do not see where the patient had his postprocedure noninvasive studies. We have been using silver collagen to the substantial area on the left Achilles. This has exposed tendon. He has a mummified left second toe. I am doubtful there is any viable tissue here. The patient is nonambulatory. He has flexion  contractures of his left hip and knee. Objective Constitutional Sitting or standing Blood Pressure is within target range for patient.. Pulse regular and within target range for patient.Marland Kitchen Respirations regular, non-labored and within target range.. Temperature is normal and within the target range for the patient.Marland Kitchen Appears in no distress. Vitals Time Taken: 3:50 PM, Height: 69 in, Weight: 170 lbs, BMI: 25.1, Temperature: 98.2 F, Pulse: 82 bpm, Respiratory Rate: 20 breaths/min, Blood Pressure: 106/55 mmHg. Eyes Conjunctivae clear. No discharge.no icterus. Respiratory work of breathing is normal. Cardiovascular Pedal pulses are vibrant but not robust.. Psychiatric appears at normal baseline. General Notes: Wound exam; subtest central area on the left Achilles still has exposed tendon. There is no evidence of infection here. Surrounding tissue actually looks healthy. The left second toe has thick dry gangrenous- looking tissue. Small area on the left posterior calf I think this is actually better. Integumentary (Hair, Skin) No evidence of infection around the wound. Wound #1 status is Open. Original cause of wound was Gradually Appeared. The wound is located on the Left,Lateral Calcaneus. The wound  measures 7.4cm length x 14cm width x 0.6cm depth; 81.367cm^2 area and 48.82cm^3 volume. There is muscle, tendon, and Fat Layer (Subcutaneous Tissue) Exposed exposed. There is no tunneling or undermining noted. There is a medium amount of purulent drainage noted. Foul odor after cleansing was noted. The wound margin is flat and intact. There is medium (34-66%) red granulation within the wound bed. There is a medium (34-66%) amount of necrotic tissue within the wound bed including Eschar, Adherent Slough and Necrosis of Muscle. Wound #2 status is Open. Original cause of wound was Pressure Injury. The wound is located on the Left Toe Second. The wound measures 2.5cm length x 6cm width x 0.1cm  depth; 11.781cm^2 area and 1.178cm^3 volume. There is Fat Layer (Subcutaneous Tissue) Exposed exposed. There is no tunneling or undermining noted. There is a small amount of serosanguineous drainage noted. The wound margin is distinct with the outline attached to the wound base. There is no granulation within the wound bed. There is a large (67-100%) amount of necrotic tissue within the wound bed including Eschar. Wound #3 status is Open. Original cause of wound was Gradually Appeared. The wound is located on the Left,Posterior Lower Leg. The wound measures 0.5cm length x 1.1cm width x 0.1cm depth; 0.432cm^2 area and 0.043cm^3 volume. There is Fat Layer (Subcutaneous Tissue) Exposed exposed. There is no tunneling or undermining noted. There is a small amount of serosanguineous drainage noted. The wound margin is flat and intact. There is large (67-100%) red granulation within the wound bed. There is no necrotic tissue within the wound bed. 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 Dressing Change Frequency: Wound #1 Left,Lateral Calcaneus: Change dressing three times week. Wound #2 Left Toe Second: Change dressing three times week. Wound Cleansing: Wound #1 Left,Lateral Calcaneus: Clean wound with Wound Cleanser - or normal saline Wound #2 Left Toe Second: Clean wound with Wound Cleanser - or normal saline Wound #3 Left,Posterior Lower Leg: Clean wound with Wound Cleanser - or normal saline Primary Wound Dressing: Wound #1 Left,Lateral Calcaneus: Silver Collagen - moisten with  normal saline, KY jelly or hydrogel Wound #2 Left Toe Second: Silver Collagen - moisten with normal saline, KY jelly or hydrogel Wound #3 Left,Posterior Lower Leg: Silver Collagen - moisten with normal saline, KY jelly or hydrogel Secondary Dressing: Wound #1 Left,Lateral Calcaneus: Kerlix/Rolled Gauze Dry Gauze Off-Loading: Other: - float feet off of bed/chair by placing pillow under legs Home Health: [facility] for wound care. - [facility] 1. I continued with the silver collagen for now 2. We called over to vein and vascular and we are able to arrange follow-up noninvasive studies for the patient. If these are adequate I might consider an advanced treatment option for the left Achilles area. Electronic Signature(s) Signed: 09/09/2018 6:24:47 PM By: Linton Ham MD Entered By: Linton Ham on 09/09/2018 18:13:51 -------------------------------------------------------------------------------- SuperBill Details Patient Name: Date of Service: [name] 09/09/2018 Medical Record [phone] [phone] Patient Account Number: [id number] Date of Birth/Sex: Treating RN: [birth year] (58 y.o. Nathaniel White Primary Care Provider: Geryl Rankins Other Clinician: Referring Provider: Treating Provider/Extender:Dewain Platz, Rodrigo Ran, [name] in Treatment: 10 Diagnosis Coding ICD-10 Codes Code  Description E11.621 Type 2 diabetes mellitus with foot ulcer E11.52 Type 2 diabetes mellitus with diabetic peripheral angiopathy with gangrene I87.333 Chronic venous hypertension (idiopathic) with ulcer and inflammation of bilateral lower extremity L97.423 Non-pressure chronic ulcer of left heel and midfoot with necrosis of muscle L97.221 Non-pressure chronic ulcer of left calf limited to breakdown of skin Facility Procedures CPT4 Code: [id number] Description: 99214 - WOUND CARE VISIT-LEV 4 EST PT Modifier: Quantity: 1 Physician Procedures CPT4 Code Description:  [id number] 99213 - WC PHYS LEVEL 3 - EST PT ICD-10 Diagnosis Description L97.423 Non-pressure chronic ulcer of left heel and midfoot wi E11.52 Type 2 diabetes mellitus with diabetic peripheral angi E11.621 Type 2 diabetes mellitus  with foot ulcer L97.221 Non-pressure chronic ulcer of left calf limited to bre Modifier: th necrosis of m opathy with gang akdown of skin Quantity: 1 uscle rene Electronic Signature(s) Signed: 09/09/2018 6:24:47 PM By: Linton Ham MD Entered By: Linton Ham on 09/09/2018 18:14:14

## 2018-12-17 NOTE — Progress Notes (Signed)
EDDI, HYMES ([id number]) Visit Report for 12/12/2018 Debridement Details Patient Name: Date of Service: Nathaniel White, KUYPER 12/12/2018 2:30 PM Medical Record [id number] Patient Account Number: [id number] Date of Birth/Sex: Treating RN: 01-12-1960 (58 y.o. [name] Primary Care Provider: Geryl Rankins Other Clinician: Referring Provider: Treating Provider/Extender:Panayiotis Rain[place], Rodrigo Ran, Paulette Blanch in Treatment: 23 Debridement Performed for Wound #1 Left,Lateral Calcaneus Assessment: Performed By: Physician Ricard Dillon., MD Debridement Type: Debridement Severity of Tissue Pre Fat layer exposed Debridement: Level of Consciousness (Pre- Awake and Alert procedure): Pre-procedure Verification/Time Out Taken: Yes - 15:18 Start Time: 15:18 Pain Control: Other : Benzocaine 20% Total Area Debrided (L x W): 3 (cm) x 4 (cm) = 12 (cm) Tissue and other material Viable, Non-Viable, Callus, Subcutaneous, Skin: Dermis debrided: Level: Skin/Subcutaneous Tissue Debridement Description: Excisional Instrument: Curette Bleeding: Minimum Hemostasis Achieved: Pressure End Time: 15:19 Procedural Pain: 0 Post Procedural Pain: 0 Response to Treatment: Procedure was tolerated well Level of Consciousness Awake and Alert (Post-procedure): Post Debridement Measurements of Total Wound Length: (cm) 7 Width: (cm) 4.6 Depth: (cm) 0.1 Volume: (cm) 2.529 Character of Wound/Ulcer Post Improved Debridement: Severity of Tissue Post Debridement: Fat layer exposed Post Procedure Diagnosis Same as Pre-procedure Electronic Signature(s) Signed: 12/12/2018 6:02:04 PM By: Linton Ham MD Signed: 12/17/2018 12:04:56 PM By: Kela Millin Entered By: Linton Ham on 12/12/2018 17:21:13 -------------------------------------------------------------------------------- HPI Details Patient Name: Date of Service: Nathaniel White 12/12/2018 2:30 PM Medical Record  [id number] Patient Account Number: [id number] Date of Birth/Sex: Treating RN: 01/10/1960 (58 y.o. [name] Primary Care Provider: Geryl Rankins Other Clinician: Referring Provider: Treating Provider/Extender:Nassim Cosma, Rodrigo Ran, Paulette Blanch in Treatment: 23 History of Present Illness HPI Description: ADMISSION 06/30/2018 The patient is a 58 year old man with multiple medical issues including type 2 diabetes on dialysis. Currently the patient his dialysis is actually caused by nephrosclerosis and he has been on dialysis for 20 years. Roughly a month ago according to the patient he noticed a small open area on his left heel he showed it to his caregiver and according to her this rapidly expanded. Looking through [facility] health link he was in the ER with left foot pain on 4/3. An x-ray was negative noting only pedal vessel atherosclerosis. He was also seen on 4/22 at that point he was felt to have a cellulitis and abscess of the left foot he was given Silvadene. After her third visit to the ER and a visit to podiatry [Dr. Price] he was referred here. He has been using Silvadene. The patient thinks that he was bitten by something initially, but he did not actually see this The patient has a history of type 2 diabetes although he is not currently on anything for this and his hemoglobin A1c was 5; COPD, dilated cardiomyopathy, mitral regurgitation, CHF, remote CVA with left-sided hemiparesis, hypertension ABI in our clinic was not easy to obtain although we were able to get 0.93 on the left 6/29; I did a debridement on this last time however once again it he comes in with a mostly black eschar at surface. Very tender. We have not yet managed to get arterial studies on him. I will see if I can get them done through Dr. Kennon Holter office 7/9; his arterial studies were done on 7/1. On the right side his studies are not too bad with noncompressible ABIs at the PTA great TBI of 0.64  and monophasic waveforms on the left however an ABI could not be obtained at the ATA, at the PTA his  ABI was 0.2 with a dampened monophasic waveforms there was no pressure in the great toe. He is developed a gangrenous-looking second toes since the last time I saw this patient. There is an ulcer on the medial aspect the second toe. The area on his heel is declining 7/21; since the patient was last here he was revascularized by Dr. Donzetta Matters on 07/21/2018. Noteworthy on the left that the SFA was approximately 50% stenosed at the takeoff but this was not treated. Popliteal artery had heavy areas of calcification and included in one segment and reconstituted just beyond this. This area was stented. After stenting there was 0% residual stenosis. It was noted he appeared to have three-vessel runoff via the tibials which are heavily calcified but without focal occlusions. The patient does not complain of pain. The area on the left lateral heel is a deep necrotic wound it does not look any more viable this week. He has a gangrenous second toe which I do not think is going to be viable. Finally he has a new area on the left posterior calf 7/28; patient arrives with the mummified second toe. The large necrotic wound on the left lateral calcaneus is separating black eschar. He had a new area superiorly on the posterior calf. Home health could not get Iodoflex and he arrived with calcium alginate. He has been revascularized by Dr. Donzetta Matters patient is not aware when his follow-up appointment is. 8/4-Patient is back to the clinic with the mummified second toe on the left, left calcaneal ulcer with black eschar and visible tendon. His appointment with Dr. Gwenlyn Saran is on the 14th of this month. I have indicated to him that this is important to keep 8/11-8/11-Patient comes back for his left medial ankle wound, the more distal wound is bigger, has a lot of necrotic tissue, greenish drainage, eschar coating a portion of the  wound 8/18; I believe the patient has had follow-up arterial studies ordered by Dr. Donzetta Matters but I do not think they have been done and the patient and his friend are not aware of an appointment time. He has a mummified left second toe and a small area on the left posterior calf in the mid aspect. This substantial area is on the left Achilles area extending into the left lateral heel. This is a deep necrotic wound. It no longer has the black gangrenous surface. His friend says that there is still a malodorous discharge. I note that Dr. Evette Doffing did a culture of this area on 8/12. This grew Enterococcus faecalis and E. coli. He is started on Augmentin although the E. coli only has intermediate sensitivities here. He was referred to infectious disease. We could add either a first or third generation cephalosporin which should cover the E. coli although I did not do this today. 8/25; the patient has a mummified left second toe a substantial wound over the left Achilles area with exposed tendon. And a small area on the left posterior calf. He has been revascularized by Dr. Donzetta Matters in July. He has not made it for his follow-up noninvasive studies. X-ray I did of the left heel last week showed soft tissue gas along the posterior aspect of the heel and soft tissue defect and findings suspicious for osteomyelitis involving the distal aspect of the second proximal phalanx. This is the mummified toe. The patient went to see Dr. Linus Salmons of infectious disease. I am not able to really follow the flow of his note but according to when I am seeing  he did not add or suggest any additional antibiotics unless he called these into dialysis. 9/1; I was able to review Dr. Henreitta Leber notes he did not feel any further antibiotics were indicated. I still do not see where the patient had his postprocedure noninvasive studies. We have been using silver collagen to the substantial area on the left Achilles. This has exposed tendon. He  has a mummified left second toe. I am doubtful there is any viable tissue here. The patient is nonambulatory. He has flexion contractures of his left hip and knee. 10/1; the patient has not been here in a month I am really not sure why. He was seen by vein and vascular on 9/25 he had his noninvasive studies done. His monophasic waveforms on the left were noted. He had Dopplers across the stent which was felt to be patent. The patient was seen by Dagoberto Ligas PAC. The arterial duplex today demonstrated a patent popliteal stent with improved TBI. Apparently his caregiver demanded an amputation but this was not felt to be necessary. He is on aspirin and Plavix. Dressings are being changed by home health he was using Prisma. 10/9; patient sees the vascular next week. We will call urgently this morning by home health report that the mom applied left second toe had "almost fallen off". Nevertheless the patient arrived in my clinic today. He has a lateral left heel wound. 10/16; substantial wound on the left lateral heel extending into the Achilles area. Also a mummified area on the left second toe. I contacted Dr. Donzetta Matters about that earlier in the week. He agrees that the left second toe needs an amputation. Apparently noninvasive Doppler tests in Dr. Claretha Cooper office showed good blood flow according to the patient although I did not see this in the note 11/9; substantial wound on the left lateral heel extending to the Achilles area. The left second toe was amputated by Dr. Donzetta Matters on 10/28. I did not review this area. 12/4; patient has not been here in a month. He has home health. The left second toe amputation site has closed over. The area on the lateral part of his heel extending into the Achilles is a lot better. Thick surrounding material around the wound edge will inhibit final healing however. Electronic Signature(s) Signed: 12/12/2018 6:02:04 PM By: Linton Ham MD Entered By: Linton Ham on  12/12/2018 17:22:15 -------------------------------------------------------------------------------- Physical Exam Details Patient Name: Date of Service: Nathaniel White, Nathaniel White 12/12/2018 2:30 PM Medical Record [id number] Patient Account Number: [id number] Date of Birth/Sex: Treating RN: 04-Sep-1960 (58 y.o. [name] Primary Care Provider: Geryl Rankins Other Clinician: Referring Provider: Treating Provider/Extender:Skylin Kennerson, Rodrigo Ran, [name] in Treatment: 23 Constitutional Sitting or standing Blood Pressure is within target range for patient.. Pulse regular and within target range for patient.Marland Kitchen Respirations regular, non-labored and within target range.. Temperature is normal and within the target range for the patient.Marland Kitchen Appears in no distress. Cardiovascular He has a dorsalis pedis pulse but I could not feel the posterior tibial.. Notes Wound exam; the left third toe amputation site is closed the circumference of his wound was debrided with thick callus skin and subcutaneous tissue from the wound margin I am hopeful this will allow it further epithelialization although in general the rest of the central part of the wound did not require debridement and the tissue looked healthy even under illumination Electronic Signature(s) Signed: 12/12/2018 6:02:04 PM By: Linton Ham MD Entered By: Linton Ham on 12/12/2018 17:24:01 -------------------------------------------------------------------------------- Physician Orders Details Patient Name: Date  of Service: LATHEN, SEAL 12/12/2018 2:30 PM Medical Record [id number] Patient Account Number: [id number] Date of Birth/Sex: Treating RN: October 03, 1960 (58 y.o. [name] Primary Care Provider: Geryl Rankins Other Clinician: Referring Provider: Treating Provider/Extender:Jasman Pfeifle, Rodrigo Ran, Paulette Blanch in Treatment: 23 Verbal / Phone Orders: No Diagnosis Coding ICD-10 Coding Code  Description E11.621 Type 2 diabetes mellitus with foot ulcer E11.52 Type 2 diabetes mellitus with diabetic peripheral angiopathy with gangrene I87.333 Chronic venous hypertension (idiopathic) with ulcer and inflammation of bilateral lower extremity L97.423 Non-pressure chronic ulcer of left heel and midfoot with necrosis of muscle L97.221 Non-pressure chronic ulcer of left calf limited to breakdown of skin Follow-up Appointments Return Appointment in 1 week. Dressing Change Frequency Wound #1 Left,Lateral Calcaneus Change dressing three times week. Wound Cleansing Wound #1 Left,Lateral Calcaneus Clean wound with Wound Cleanser - or normal saline Primary Wound Dressing Wound #1 Left,Lateral Calcaneus Hydrofera Blue Secondary Dressing Wound #1 Left,Lateral Calcaneus Kerlix/Rolled Gauze Dry Gauze ABD pad Off-Loading Other: - float feet off of bed/chair by placing pillow under legs [facility] for wound care. - [facility] Electronic Signature(s) Signed: 12/12/2018 6:02:04 PM By: Linton Ham MD Signed: 12/17/2018 12:04:56 PM By: Kela Millin Entered By: Kela Millin on 12/12/2018 14:55:42 -------------------------------------------------------------------------------- Problem List Details Patient Name: Date of Service: Nathaniel White, Nathaniel White 12/12/2018 2:30 PM Medical Record [id number] Patient Account Number: [id number] Date of Birth/Sex: Treating RN: [birth year] (58 y.o. [name] Primary Care Provider: Geryl Rankins Other Clinician: Referring Provider: Treating Provider/Extender:Andres Vest, Rodrigo Ran, Paulette Blanch in Treatment: 23 Active Problems ICD-10 Evaluated Encounter Code Description Active Date Today Diagnosis E11.621 Type 2 diabetes mellitus with foot ulcer 06/30/2018 No Yes E11.52 Type 2 diabetes mellitus with diabetic peripheral 07/17/2018 No Yes angiopathy with gangrene I87.333 Chronic venous  hypertension (idiopathic) with ulcer 06/30/2018 No Yes and inflammation of bilateral lower extremity L97.423 Non-pressure chronic ulcer of left heel and midfoot 07/29/2018 No Yes with necrosis of muscle L97.221 Non-pressure chronic ulcer of left calf limited to 07/29/2018 No Yes breakdown of skin Inactive Problems ICD-10 Code Description Active Date Inactive Date E11.51 Type 2 diabetes mellitus with diabetic peripheral angiopathy 06/30/2018 06/30/2018 without gangrene Resolved Problems Electronic Signature(s) Signed: 12/12/2018 6:02:04 PM By: Linton Ham MD Entered By: Linton Ham on 12/12/2018 17:20:49 -------------------------------------------------------------------------------- Progress Note Details Patient Name: Date of Service: Nathaniel White 12/12/2018 2:30 PM Medical Record [id number] Patient Account Number: [id number] Date of Birth/Sex: Treating RN: [birth year] (58 y.o. [name] Primary Care Provider: Geryl Rankins Other Clinician: Referring Provider: Treating Provider/Extender:Raylene Carmickle, Rodrigo Ran, Paulette Blanch in Treatment: 23 Subjective History of Present Illness (HPI) ADMISSION 06/30/2018 The patient is a 58 year old man with multiple medical issues including type 2 diabetes on dialysis. Currently the patient his dialysis is actually caused by nephrosclerosis and he has been on dialysis for 20 years. Roughly a month ago according to the patient he noticed a small open area on his left heel he showed it to his caregiver and according to her this rapidly expanded. Looking through [facility] health link he was in the ER with left foot pain on 4/3. An x-ray was negative noting only pedal vessel atherosclerosis. He was also seen on 4/22 at that point he was felt to have a cellulitis and abscess of the left foot he was given Silvadene. After her third visit to the ER and a visit to podiatry [Dr. Price] he was referred here. He has been using Silvadene.  The patient thinks that he was bitten  by something initially, but he did not actually see this The patient has a history of type 2 diabetes although he is not currently on anything for this and his hemoglobin A1c was 5; COPD, dilated cardiomyopathy, mitral regurgitation, CHF, remote CVA with left-sided hemiparesis, hypertension ABI in our clinic was not easy to obtain although we were able to get 0.93 on the left 6/29; I did a debridement on this last time however once again it he comes in with a mostly black eschar at surface. Very tender. We have not yet managed to get arterial studies on him. I will see if I can get them done through Dr. Kennon Holter office 7/9; his arterial studies were done on 7/1. On the right side his studies are not too bad with noncompressible ABIs at the PTA great TBI of 0.64 and monophasic waveforms on the left however an ABI could not be obtained at the ATA, at the PTA his ABI was 0.2 with a dampened monophasic waveforms there was no pressure in the great toe. He is developed a gangrenous-looking second toes since the last time I saw this patient. There is an ulcer on the medial aspect the second toe. The area on his heel is declining 7/21; since the patient was last here he was revascularized by Dr. Donzetta Matters on 07/21/2018. Noteworthy on the left that the SFA was approximately 50% stenosed at the takeoff but this was not treated. Popliteal artery had heavy areas of calcification and included in one segment and reconstituted just beyond this. This area was stented. After stenting there was 0% residual stenosis. It was noted he appeared to have three-vessel runoff via the tibials which are heavily calcified but without focal occlusions. The patient does not complain of pain. The area on the left lateral heel is a deep necrotic wound it does not look any more viable this week. He has a gangrenous second toe which I do not think is going to be viable. Finally he has a new area on  the left posterior calf 7/28; patient arrives with the mummified second toe. The large necrotic wound on the left lateral calcaneus is separating black eschar. He had a new area superiorly on the posterior calf. Home health could not get Iodoflex and he arrived with calcium alginate. He has been revascularized by Dr. Donzetta Matters patient is not aware when his follow-up appointment is. 8/4-Patient is back to the clinic with the mummified second toe on the left, left calcaneal ulcer with black eschar and visible tendon. His appointment with Dr. Gwenlyn Saran is on the 14th of this month. I have indicated to him that this is important to keep 8/11-8/11-Patient comes back for his left medial ankle wound, the more distal wound is bigger, has a lot of necrotic tissue, greenish drainage, eschar coating a portion of the wound 8/18; I believe the patient has had follow-up arterial studies ordered by Dr. Donzetta Matters but I do not think they have been done and the patient and his friend are not aware of an appointment time. He has a mummified left second toe and a small area on the left posterior calf in the mid aspect. This substantial area is on the left Achilles area extending into the left lateral heel. This is a deep necrotic wound. It no longer has the black gangrenous surface. His friend says that there is still a malodorous discharge. I note that Dr. Evette Doffing did a culture of this area on 8/12. This grew Enterococcus faecalis and E. coli. He  is started on Augmentin although the E. coli only has intermediate sensitivities here. He was referred to infectious disease. We could add either a first or third generation cephalosporin which should cover the E. coli although I did not do this today. 8/25; the patient has a mummified left second toe a substantial wound over the left Achilles area with exposed tendon. And a small area on the left posterior calf. He has been revascularized by Dr. Donzetta Matters in July. He has not made it for his  follow-up noninvasive studies. X-ray I did of the left heel last week showed soft tissue gas along the posterior aspect of the heel and soft tissue defect and findings suspicious for osteomyelitis involving the distal aspect of the second proximal phalanx. This is the mummified toe. The patient went to see Dr. Linus Salmons of infectious disease. I am not able to really follow the flow of his note but according to when I am seeing he did not add or suggest any additional antibiotics unless he called these into dialysis. 9/1; I was able to review Dr. Henreitta Leber notes he did not feel any further antibiotics were indicated. I still do not see where the patient had his postprocedure noninvasive studies. We have been using silver collagen to the substantial area on the left Achilles. This has exposed tendon. He has a mummified left second toe. I am doubtful there is any viable tissue here. The patient is nonambulatory. He has flexion contractures of his left hip and knee. 10/1; the patient has not been here in a month I am really not sure why. He was seen by vein and vascular on 9/25 he had his noninvasive studies done. His monophasic waveforms on the left were noted. He had Dopplers across the stent which was felt to be patent. The patient was seen by Dagoberto Ligas PAC. The arterial duplex today demonstrated a patent popliteal stent with improved TBI. Apparently his caregiver demanded an amputation but this was not felt to be necessary. He is on aspirin and Plavix. Dressings are being changed by home health he was using Prisma. 10/9; patient sees the vascular next week. We will call urgently this morning by home health report that the mom applied left second toe had "almost fallen off". Nevertheless the patient arrived in my clinic today. He has a lateral left heel wound. 10/16; substantial wound on the left lateral heel extending into the Achilles area. Also a mummified area on the left second toe. I  contacted Dr. Donzetta Matters about that earlier in the week. He agrees that the left second toe needs an amputation. Apparently noninvasive Doppler tests in Dr. Claretha Cooper office showed good blood flow according to the patient although I did not see this in the note 11/9; substantial wound on the left lateral heel extending to the Achilles area. The left second toe was amputated by Dr. Donzetta Matters on 10/28. I did not review this area. 12/4; patient has not been here in a month. He has home health. The left second toe amputation site has closed over. The area on the lateral part of his heel extending into the Achilles is a lot better. Thick surrounding material around the wound edge will inhibit final healing however. Objective Constitutional Sitting or standing Blood Pressure is within target range for patient.. Pulse regular and within target range for patient.Marland Kitchen Respirations regular, non-labored and within target range.. Temperature is normal and within the target range for the patient.Marland Kitchen Appears in no distress. Vitals Time Taken: 2:44 PM, Height:  69 in, Weight: 170 lbs, BMI: 25.1, Temperature: 98.5 F, Pulse: 98 bpm, Respiratory Rate: 16 breaths/min, Blood Pressure: 112/86 mmHg. Cardiovascular He has a dorsalis pedis pulse but I could not feel the posterior tibial.. General Notes: Wound exam; the left third toe amputation site is closed the circumference of his wound was debrided with thick callus skin and subcutaneous tissue from the wound margin I am hopeful this will allow it further epithelialization although in general the rest of the central part of the wound did not require debridement and the tissue looked healthy even under illumination Integumentary (Hair, Skin) Wound #1 status is Open. Original cause of wound was Gradually Appeared. The wound is located on the Left,Lateral Calcaneus. The wound measures 7cm length x 4.6cm width x 0.1cm depth; 25.29cm^2 area and 2.529cm^3 volume. There is muscle,  tendon, and Fat Layer (Subcutaneous Tissue) Exposed exposed. There is no tunneling or undermining noted. There is a medium amount of serosanguineous drainage noted. The wound margin is well defined and not attached to the wound base. There is large (67-100%) red granulation within the wound bed. There is a small (1-33%) amount of necrotic tissue within the wound bed including Adherent Slough and Necrosis of Muscle. 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 There was a Excisional Skin/Subcutaneous Tissue Debridement with a total area of 12 sq cm performed by Ricard Dillon., MD. With the following instrument(s): Curette to remove Viable and Non-Viable tissue/material. Material removed includes Callus, Subcutaneous Tissue, and Skin: Dermis after achieving pain control using Other (Benzocaine 20%). No specimens were taken. A time out was conducted at 15:18, prior to the start of the procedure. A Minimum amount of bleeding was controlled with Pressure. The procedure was tolerated well with a pain level of 0 throughout and a pain level of 0 following the procedure. Post Debridement Measurements: 7cm length x 4.6cm width x 0.1cm depth; 2.529cm^3 volume. Character of Wound/Ulcer Post Debridement is improved. Severity of Tissue Post Debridement is: Fat layer exposed. Post procedure Diagnosis Wound #1: Same as Pre-Procedure Plan Follow-up  Appointments: Return Appointment in 1 week. Dressing Change Frequency: Wound #1 Left,Lateral Calcaneus: Change dressing three times week. Wound Cleansing: Wound #1 Left,Lateral Calcaneus: Clean wound with Wound Cleanser - or normal saline Primary Wound Dressing: Wound #1 Left,Lateral Calcaneus: Hydrofera Blue Secondary Dressing: Wound #1 Left,Lateral Calcaneus: Kerlix/Rolled Gauze Dry Gauze ABD pad Off-Loading: Other: - float feet off of bed/chair by placing pillow under legs Home Health: [facility] for wound care. - Encompass #1 continue with Hydrofera Blue. The area on the left lateral calcaneus is a lot better Electronic Signature(s) Signed: 12/12/2018 6:02:04 PM By: Linton Ham MD Entered By: Linton Ham on 12/12/2018 17:25:26 -------------------------------------------------------------------------------- SuperBill Details Patient Name: Date of Service: Nathaniel White, Nathaniel White 12/12/2018 Medical Record [phone] Patient Account Number: [id number] Date of Birth/Sex: Treating RN: [birth year] (58 y.o. 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 L97.221 Non-pressure chronic ulcer of left calf limited to breakdown of skin Facility Procedures CPT4 Code Description: 48628241 11042 - DEB SUBQ TISSUE 20 SQ CM/< ICD-10 Diagnosis Description L97.423 Non-pressure chronic ulcer of left heel and midfoot with Modifier: necrosis of mus Quantity: 1  cle Physician Procedures CPT4 Code Description: 7530104 11042 - WC PHYS SUBQ TISS 20 SQ CM ICD-10 Diagnosis Description L97.423 Non-pressure chronic ulcer of left heel and midfoot with Modifier: necrosis of musc Quantity: 1 le Electronic Signature(s) Signed: 12/12/2018 6:02:04 PM By: Linton Ham MD Entered By: Linton Ham on 12/12/2018 17:25:41

## 2018-12-19 ENCOUNTER — Encounter (HOSPITAL_BASED_OUTPATIENT_CLINIC_OR_DEPARTMENT_OTHER): Payer: Medicare Other | Admitting: Internal Medicine

## 2018-12-23 ENCOUNTER — Other Ambulatory Visit: Payer: Self-pay

## 2018-12-23 DIAGNOSIS — I739 Peripheral vascular disease, unspecified: Secondary | ICD-10-CM

## 2018-12-26 ENCOUNTER — Encounter (HOSPITAL_BASED_OUTPATIENT_CLINIC_OR_DEPARTMENT_OTHER): Payer: Medicare Other | Admitting: Internal Medicine

## 2018-12-29 ENCOUNTER — Encounter (HOSPITAL_BASED_OUTPATIENT_CLINIC_OR_DEPARTMENT_OTHER): Payer: Medicare Other | Admitting: Internal Medicine

## 2018-12-29 ENCOUNTER — Other Ambulatory Visit (HOSPITAL_COMMUNITY): Payer: Medicare Other

## 2018-12-29 DIAGNOSIS — J449 Chronic obstructive pulmonary disease, unspecified: Secondary | ICD-10-CM | POA: Diagnosis not present

## 2018-12-29 DIAGNOSIS — I69354 Hemiplegia and hemiparesis following cerebral infarction affecting left non-dominant side: Secondary | ICD-10-CM | POA: Diagnosis not present

## 2018-12-29 DIAGNOSIS — Z9981 Dependence on supplemental oxygen: Secondary | ICD-10-CM | POA: Diagnosis not present

## 2018-12-29 DIAGNOSIS — Z992 Dependence on renal dialysis: Secondary | ICD-10-CM | POA: Diagnosis not present

## 2018-12-29 DIAGNOSIS — E11621 Type 2 diabetes mellitus with foot ulcer: Secondary | ICD-10-CM | POA: Diagnosis not present

## 2018-12-29 DIAGNOSIS — E1151 Type 2 diabetes mellitus with diabetic peripheral angiopathy without gangrene: Secondary | ICD-10-CM | POA: Diagnosis not present

## 2018-12-29 DIAGNOSIS — E1122 Type 2 diabetes mellitus with diabetic chronic kidney disease: Secondary | ICD-10-CM | POA: Diagnosis not present

## 2018-12-29 DIAGNOSIS — I509 Heart failure, unspecified: Secondary | ICD-10-CM | POA: Diagnosis not present

## 2018-12-29 DIAGNOSIS — Z4800 Encounter for change or removal of nonsurgical wound dressing: Secondary | ICD-10-CM | POA: Diagnosis not present

## 2018-12-29 DIAGNOSIS — N186 End stage renal disease: Secondary | ICD-10-CM | POA: Diagnosis not present

## 2018-12-29 DIAGNOSIS — L97522 Non-pressure chronic ulcer of other part of left foot with fat layer exposed: Secondary | ICD-10-CM | POA: Diagnosis not present

## 2018-12-29 DIAGNOSIS — I132 Hypertensive heart and chronic kidney disease with heart failure and with stage 5 chronic kidney disease, or end stage renal disease: Secondary | ICD-10-CM | POA: Diagnosis not present

## 2018-12-29 DIAGNOSIS — I420 Dilated cardiomyopathy: Secondary | ICD-10-CM | POA: Diagnosis not present

## 2018-12-29 DIAGNOSIS — Z87891 Personal history of nicotine dependence: Secondary | ICD-10-CM | POA: Diagnosis not present

## 2018-12-29 DIAGNOSIS — L97422 Non-pressure chronic ulcer of left heel and midfoot with fat layer exposed: Secondary | ICD-10-CM | POA: Diagnosis not present

## 2018-12-30 DIAGNOSIS — L97522 Non-pressure chronic ulcer of other part of left foot with fat layer exposed: Secondary | ICD-10-CM | POA: Diagnosis not present

## 2018-12-30 DIAGNOSIS — E1151 Type 2 diabetes mellitus with diabetic peripheral angiopathy without gangrene: Secondary | ICD-10-CM | POA: Diagnosis not present

## 2018-12-30 DIAGNOSIS — E11621 Type 2 diabetes mellitus with foot ulcer: Secondary | ICD-10-CM | POA: Diagnosis not present

## 2018-12-30 DIAGNOSIS — L97422 Non-pressure chronic ulcer of left heel and midfoot with fat layer exposed: Secondary | ICD-10-CM | POA: Diagnosis not present

## 2018-12-30 DIAGNOSIS — Z4800 Encounter for change or removal of nonsurgical wound dressing: Secondary | ICD-10-CM | POA: Diagnosis not present

## 2018-12-30 DIAGNOSIS — E1122 Type 2 diabetes mellitus with diabetic chronic kidney disease: Secondary | ICD-10-CM | POA: Diagnosis not present

## 2019-01-05 ENCOUNTER — Encounter (HOSPITAL_BASED_OUTPATIENT_CLINIC_OR_DEPARTMENT_OTHER): Payer: Medicare Other | Admitting: Internal Medicine

## 2019-01-05 DIAGNOSIS — E11621 Type 2 diabetes mellitus with foot ulcer: Secondary | ICD-10-CM | POA: Diagnosis not present

## 2019-01-05 NOTE — Progress Notes (Signed)
Nathaniel White, Nathaniel White ([id number]) Visit Report for 01/05/2019 Arrival Information Details Patient Name: Date of Service: Nathaniel White, Nathaniel White 01/05/2019 3:15 PM Medical Record [id number] Patient Account Number: [id number] Date of Birth/Sex: Treating RN: 1960-02-17 (58 y.o. Male) Kela Millin Primary Care Ann Bohne: Geryl Rankins Other Clinician: Referring Texanna Hilburn: Treating Glorimar Stroope/Extender:Robson, Rodrigo Ran, Paulette Blanch in Treatment: 27 Visit Information History Since Last Visit Added or deleted any medications: No Patient Arrived: Wheel Chair Any new allergies or adverse reactions: No Arrival Time: 16:10 Had a fall or experienced change in No activities of daily living that may affect Accompanied By: caregiver risk of falls: Transfer Assistance: None Signs or symptoms of abuse/neglect since last No Patient Identification Verified: Yes visito Secondary Verification Process Completed: Yes Hospitalized since last visit: No Patient Requires Transmission-Based No Has Dressing in Place as Prescribed: Yes Precautions: Pain Present Now: No Patient Has Alerts: No Electronic Signature(s) Signed: 01/05/2019 5:46:04 PM By: Kela Millin Entered By: Kela Millin on 01/05/2019 16:19:49 -------------------------------------------------------------------------------- Clinic Level of Care Assessment Details Patient Name: Date of Service: Nathaniel White, Nathaniel White 01/05/2019 3:15 PM Medical Record [id number] Patient Account Number: [id number] Date of Birth/Sex: Treating RN: 04-20-1960 (58 y.o. Male) Levan Hurst Primary Care Kerrigan Glendening: Geryl Rankins Other Clinician: Referring Yaiza Palazzola: Treating Jandy Brackens/Extender:Robson, Rodrigo Ran, Paulette Blanch in Treatment: 27 Clinic Level of Care Assessment Items TOOL 4 Quantity Score X - Use when only an EandM is performed on FOLLOW-UP visit 1 0 ASSESSMENTS - Nursing Assessment / Reassessment X - Reassessment of  Co-morbidities (includes updates in patient status) 1 10 X - Reassessment of Adherence to Treatment Plan 1 5 ASSESSMENTS - Wound and Skin Assessment / Reassessment X - Simple Wound Assessment / Reassessment - one wound 1 5 []  - Complex Wound Assessment / Reassessment - multiple wounds 0 []  - Dermatologic / Skin Assessment (not related to wound area) 0 ASSESSMENTS - Focused Assessment []  - Circumferential Edema Measurements - multi extremities 0 []  - Nutritional Assessment / Counseling / Intervention 0 X - Lower Extremity Assessment (monofilament, tuning fork, pulses) 1 5 []  - Peripheral Arterial Disease Assessment (using hand held doppler) 0 ASSESSMENTS - Ostomy and/or Continence Assessment and Care []  - Incontinence Assessment and Management 0 []  - Ostomy Care Assessment and Management (repouching, etc.) 0 PROCESS - Coordination of Care X - Simple Patient / Family Education for ongoing care 1 15 []  - Complex (extensive) Patient / Family Education for ongoing care 0 X - Staff obtains Programmer, systems, Records, Test Results / Process Orders 1 10 X - Staff telephones HHA, Nursing Homes / Clarify orders / etc 1 10 []  - Routine Transfer to another Facility (non-emergent condition) 0 []  - Routine Hospital Admission (non-emergent condition) 0 []  - New Admissions / Biomedical engineer / Ordering NPWT, Apligraf, etc. 0 []  - Emergency Hospital Admission (emergent condition) 0 X - Simple Discharge Coordination 1 10 []  - Complex (extensive) Discharge Coordination 0 PROCESS - Special Needs []  - Pediatric / Minor Patient Management 0 []  - Isolation Patient Management 0 []  - Hearing / Language / Visual special needs 0 []  - Assessment of Community assistance (transportation, D/C planning, etc.) 0 []  - Additional assistance / Altered mentation 0 []  - Support Surface(s) Assessment (bed, cushion, seat, etc.) 0 INTERVENTIONS - Wound Cleansing / Measurement X - Simple Wound Cleansing - one wound 1 5 []  -  Complex Wound Cleansing - multiple wounds 0 X - Wound Imaging (photographs - any number of wounds) 1 5 []  - Wound Tracing (instead of photographs) 0 X -  Simple Wound Measurement - one wound 1 5 []  - Complex Wound Measurement - multiple wounds 0 INTERVENTIONS - Wound Dressings []  - Small Wound Dressing one or multiple wounds 0 X - Medium Wound Dressing one or multiple wounds 1 15 []  - Large Wound Dressing one or multiple wounds 0 X - Application of Medications - topical 1 5 []  - Application of Medications - injection 0 INTERVENTIONS - Miscellaneous []  - External ear exam 0 []  - Specimen Collection (cultures, biopsies, blood, body fluids, etc.) 0 []  - Specimen(s) / Culture(s) sent or taken to Lab for analysis 0 []  - Patient Transfer (multiple staff / Harrel Lemon Lift / Similar devices) 0 []  - Simple Staple / Suture removal (25 or less) 0 []  - Complex Staple / Suture removal (26 or more) 0 []  - Hypo / Hyperglycemic Management (close monitor of Blood Glucose) 0 []  - Ankle / Brachial Index (ABI) - do not check if billed separately 0 X - Vital Signs 1 5 Has the patient been seen at the hospital within the last three years: Yes Total Score: 110 Level Of Care: New/Established - Level 3 Electronic Signature(s) Signed: 01/05/2019 6:10:06 PM By: Levan Hurst RN, BSN Entered By: Levan Hurst on 01/05/2019 18:09:39 -------------------------------------------------------------------------------- Encounter Discharge Information Details Patient Name: Date of Service: Nathaniel White 01/05/2019 3:15 PM Medical Record [id number] Patient Account Number: [id number] Date of Birth/Sex: Treating RN: 1961/01/05 (58 y.o. Male) Kela Millin Primary Care Tanasha Menees: Geryl Rankins Other Clinician: Referring Lataya Varnell: Treating Lakecia Deschamps/Extender:Robson, Rodrigo Ran, Paulette Blanch in Treatment: 27 Encounter Discharge Information Items Discharge Condition: Stable Ambulatory Status:  Wheelchair Discharge Destination: Home Transportation: Private Auto Accompanied By: caregiver Schedule Follow-up Appointment: Yes Clinical Summary of Care: Patient Declined Electronic Signature(s) Signed: 01/05/2019 5:46:04 PM By: Kela Millin Entered By: Kela Millin on 01/05/2019 17:22:24 -------------------------------------------------------------------------------- Lower Extremity Assessment Details Patient Name: Date of Service: Nathaniel White, Nathaniel White 01/05/2019 3:15 PM Medical Record [id number] Patient Account Number: [id number] Date of Birth/Sex: Treating RN: 12-08-1960 (58 y.o. Male) Kela Millin Primary Care Amay Mijangos: Geryl Rankins Other Clinician: Referring Akirah Storck: Treating Abeeha Twist/Extender:Robson, Rodrigo Ran, [name] in Treatment: 27 Edema Assessment Assessed: [Left: No] [Right: No] Edema: [Left: Ye] [Right: s] Calf Left: Right: Point of Measurement: 40 cm From Medial Instep 36 cm cm Ankle Left: Right: Point of Measurement: 10 cm From Medial Instep 26.8 cm cm Vascular Assessment Pulses: Dorsalis Pedis Palpable: [Left:No] Electronic Signature(s) Signed: 01/05/2019 5:46:04 PM By: Kela Millin Entered By: Kela Millin on 01/05/2019 16:21:23 -------------------------------------------------------------------------------- Multi Wound Chart Details Patient Name: Date of Service: Nathaniel White 01/05/2019 3:15 PM Medical Record [id number] Patient Account Number: [id number] Date of Birth/Sex: Treating RN: [birth year] (58 y.o. Male) Primary Care Loralei Radcliffe: Geryl Rankins Other Clinician: Referring Ulysee Fyock: Treating Bita Cartwright/Extender:Robson, Rodrigo Ran, [name] in Treatment: 27 Vital Signs Height(in): 69 Pulse(bpm): 101 Weight(lbs): 170 Blood Pressure(mmHg): 118/79 Body Mass Index(BMI): 25 Temperature(F): 97.8 Respiratory 18 Rate(breaths/min): Photos: [1:No Photos] [N/A:N/A] Wound Location: [1:Left  Calcaneus - Lateral] [N/A:N/A] Wounding Event: [1:Gradually Appeared] [N/A:N/A] Primary Etiology: [1:Diabetic Wound/Ulcer of the N/A Lower Extremity] Comorbid History: [1:Chronic Obstructive Pulmonary Disease (COPD), Congestive Heart Failure, Hypertension, Type II Diabetes, End Stage Renal Disease] [N/A:N/A] Date Acquired: [1:06/02/2018] [N/A:N/A] Weeks of Treatment: [1:27] [N/A:N/A] Wound Status: [1:Open] [N/A:N/A] Measurements L x W x D 4.5x2.1x0.1 [N/A:N/A] (cm) Area (cm) : [1:7.422] [N/A:N/A] Volume (cm) : [1:0.742] [N/A:N/A] % Reduction in Area: [1:76.40%] [N/A:N/A] % Reduction in Volume: 92.10% [N/A:N/A] Classification: [1:Grade 4] [N/A:N/A] Exudate Amount: [1:Medium] [N/A:N/A] Exudate Type: [1:Serosanguineous] [N/A:N/A] Exudate Color: [1:red, brown] [  N/A:N/A] Wound Margin: [1:Well defined, not attached N/A] Granulation Amount: [1:Large (67-100%)] [N/A:N/A] Granulation Quality: [1:Red, Pink] [N/A:N/A] Necrotic Amount: [1:None Present (0%)] [N/A:N/A] Exposed Structures: [1:Fat Layer (Subcutaneous N/A Tissue) Exposed: Yes Fascia: No Tendon: No Muscle: No Joint: No Bone: No Medium (34-66%)] [N/A:N/A] Treatment Notes Wound #1 (Left, Lateral Calcaneus) 1. Cleanse With Wound Cleanser 2. Periwound Care Moisturizing lotion 3. Primary Dressing Applied Hydrofera Blue 4. Secondary Dressing ABD Pad Roll Gauze 5. Secured With Tape Notes netting. Electronic Signature(s) Signed: 01/05/2019 6:10:19 PM By: Linton Ham MD Entered By: Linton Ham on 01/05/2019 17:54:37 -------------------------------------------------------------------------------- Multi-Disciplinary Care Plan Details Patient Name: Date of Service: Nathaniel White, Nathaniel White 01/05/2019 3:15 PM Medical Record [id number] Patient Account Number: [id number] Date of Birth/Sex: Treating RN: 11-02-1960 (58 y.o. Male) Levan Hurst Primary Care Jkwon Treptow: Geryl Rankins Other Clinician: Referring Kamirah Shugrue: Treating  Gyan Cambre/Extender:Robson, Rodrigo Ran, [name] in Treatment: 27 Active Inactive Wound/Skin Impairment Nursing Diagnoses: Impaired tissue integrity Knowledge deficit related to ulceration/compromised skin integrity Goals: Patient/caregiver will verbalize understanding of skin care regimen Date Initiated: 06/30/2018 Target Resolution Date: 02/06/2019 Goal Status: Active Ulcer/skin breakdown will have a volume reduction of 30% by week 4 Date Initiated: 06/30/2018 Date Inactivated: 08/05/2018 Target Resolution Date: 08/01/2018 Goal Status: Unmet Unmet Reason: comorbities Ulcer/skin breakdown will have a volume reduction of 50% by week 8 Date Initiated: 08/05/2018 Date Inactivated: 09/09/2018 Target Resolution Date: 09/05/2018 Goal Status: Unmet Unmet Reason: comorbities Ulcer/skin breakdown will have a volume reduction of 80% by week 12 Date Initiated: 09/09/2018 Date Inactivated: 10/17/2018 Target Resolution Date: 10/03/2018 Goal Status: Unmet Unmet Reason: PAD Interventions: Assess patient/caregiver ability to obtain necessary supplies Assess patient/caregiver ability to perform ulcer/skin care regimen upon admission and as needed Assess ulceration(s) every visit Provide education on ulcer and skin care Notes: Electronic Signature(s) Signed: 01/05/2019 6:10:06 PM By: Levan Hurst RN, BSN Entered By: Levan Hurst on 01/05/2019 18:08:56 -------------------------------------------------------------------------------- Pain Assessment Details Patient Name: Date of Service: Nathaniel White 01/05/2019 3:15 PM Medical Record [id number] Patient Account Number: [id number] Date of Birth/Sex: Treating RN: 1960-05-16 (58 y.o. Male) Kela Millin Primary Care Alayja Armas: Geryl Rankins Other Clinician: Referring Takyra Cantrall: Treating Ellar Hakala/Extender:Robson, Rodrigo Ran, Paulette Blanch in Treatment: 27 Active Problems Location of Pain Severity and Description of  Pain Patient Has Paino No Site Locations Pain Management and Medication Current Pain Management: Electronic Signature(s) Signed: 01/05/2019 5:46:04 PM By: Kela Millin Entered By: Kela Millin on 01/05/2019 16:20:24 -------------------------------------------------------------------------------- Patient/Caregiver Education Details Patient Name: Nathaniel White 12/28/2020andnbsp3:15 Date of Service: PM Medical Record [id number] Number: Patient Account Number: [id number] Treating RN: 03-Feb-1960 (58 y.o. Levan Hurst Date of Birth/Gender: Male) Other Clinician: Primary Care Physician:Fleming, Zelda Treating Linton Ham Referring Physician: Physician/Extender: Debbora Dus in Treatment: 79 Education Assessment Education Provided To: Patient Education Topics Provided Wound/Skin Impairment: Methods: Explain/Verbal Responses: State content correctly Electronic Signature(s) Signed: 01/05/2019 6:10:06 PM By: Levan Hurst RN, BSN Entered By: Levan Hurst on 01/05/2019 18:09:08 -------------------------------------------------------------------------------- Wound Assessment Details Patient Name: Date of Service: Nathaniel White 01/05/2019 3:15 PM Medical Record [id number] Patient Account Number: [id number] Date of Birth/Sex: Treating RN: [birth year] (58 y.o. Male) Kela Millin Primary Care Asma Boldon: Geryl Rankins Other Clinician: Referring Lauramae Kneisley: Treating Mayerly Kaman/Extender:Robson, Rodrigo Ran, [name] in Treatment: 27 Wound Status Wound Number: 1 Primary Diabetic Wound/Ulcer of the Lower Extremity Etiology: Wound Location: Left Calcaneus - Lateral Wound Open Wounding Event: Gradually Appeared Status: Date Acquired: 06/02/2018 Date Acquired: 06/02/2018 Comorbid Chronic Obstructive Pulmonary Disease Weeks Of Treatment: 27 History: (COPD), Congestive Heart Failure, Clustered Wound: No Hypertension,  Type II Diabetes, End  Stage Renal Disease Wound Measurements Length: (cm) 4.5 Width: (cm) 2.1 Depth: (cm) 0.1 Area: (cm) 7.422 Volume: (cm) 0.742 Wound Description Classification: Grade 4 Wound Margin: Well defined, not attached Exudate Amount: Medium Exudate Type: Serosanguineous Exudate Color: red, brown Wound Bed Granulation Amount: Large (67-100%) Granulation Quality: Red, Pink Necrotic Amount: None Present (0%) fter Cleansing: No ino No Exposed Structure sed: No Subcutaneous Tissue) Exposed: Yes sed: No sed: No ed: No d: No % Reduction in Area: 76.4% % Reduction in Volume: 92.1% Epithelialization: Medium (34-66%) Tunneling: No Undermining: No Foul Odor A Slough/Fibr Fascia Expo Fat Layer ( Tendon Expo Muscle Expo Joint Expos Bone Expose Treatment Notes Wound #1 (Left, Lateral Calcaneus) 1. Cleanse With Wound Cleanser 2. Periwound Care Moisturizing lotion 3. Primary Dressing Applied Hydrofera Blue 4. Secondary Dressing ABD Pad Roll Gauze 5. Secured With Tape Notes netting. Electronic Signature(s) Signed: 01/05/2019 5:46:04 PM By: Kela Millin Entered By: Kela Millin on 01/05/2019 16:23:19 -------------------------------------------------------------------------------- Vitals Details Patient Name: Date of Service: Nathaniel White 01/05/2019 3:15 PM Medical Record [id number] Patient Account Number: [id number] Date of Birth/Sex: Treating RN: [birth year] (58 y.o. Male) Kela Millin Primary Care Ari Engelbrecht: Geryl Rankins Other Clinician: Referring Juanna Pudlo: Treating Louan Base/Extender:Robson, Rodrigo Ran, [name] in Treatment: 27 Vital Signs Time Taken: 16:19 Temperature (F): 97.8 Height (in): 69 Pulse (bpm): 101 Weight (lbs): 170 Respiratory Rate (breaths/min): 18 Body Mass Index (BMI): 25.1 Blood Pressure (mmHg): 118/79 Reference Range: 80 - 120 mg / dl Electronic Signature(s) Signed: 01/05/2019 5:46:04 PM By: Kela Millin Entered By: Kela Millin on 01/05/2019 16:20:17

## 2019-01-06 ENCOUNTER — Other Ambulatory Visit: Payer: Self-pay

## 2019-01-06 DIAGNOSIS — L97522 Non-pressure chronic ulcer of other part of left foot with fat layer exposed: Secondary | ICD-10-CM | POA: Diagnosis not present

## 2019-01-06 DIAGNOSIS — Z4800 Encounter for change or removal of nonsurgical wound dressing: Secondary | ICD-10-CM | POA: Diagnosis not present

## 2019-01-06 DIAGNOSIS — L97422 Non-pressure chronic ulcer of left heel and midfoot with fat layer exposed: Secondary | ICD-10-CM | POA: Diagnosis not present

## 2019-01-06 DIAGNOSIS — E1151 Type 2 diabetes mellitus with diabetic peripheral angiopathy without gangrene: Secondary | ICD-10-CM | POA: Diagnosis not present

## 2019-01-06 DIAGNOSIS — E11621 Type 2 diabetes mellitus with foot ulcer: Secondary | ICD-10-CM | POA: Diagnosis not present

## 2019-01-06 DIAGNOSIS — E1122 Type 2 diabetes mellitus with diabetic chronic kidney disease: Secondary | ICD-10-CM | POA: Diagnosis not present

## 2019-01-06 NOTE — Progress Notes (Signed)
Nathaniel White, Nathaniel White ([id number]) Visit Report for 01/05/2019 HPI Details Patient Name: Date of Service: White, Nathaniel White 01/05/2019 3:15 PM Medical Record [id number] Patient Account Number: [id number] Date of Birth/Sex: Treating RN: 1960-09-25 (58 y.o. M) Primary Care Provider: Geryl White Other Clinician: Referring Provider: Treating Provider/Extender:Nathaniel White, Nathaniel White, Nathaniel White in Treatment: 27 History of Present Illness HPI Description: ADMISSION 06/30/2018 The patient is a 58 year old man with multiple medical issues including type 2 diabetes on dialysis. Currently the patient his dialysis is actually caused by nephrosclerosis and he has been on dialysis for 20 years. Roughly a month ago according to the patient he noticed a small open area on his left heel he showed it to his caregiver and according to her this rapidly expanded. Looking through [facility] health link he was in the ER with left foot pain on 4/3. An x-ray was negative noting only pedal vessel atherosclerosis. He was also seen on 4/22 at that point he was felt to have a cellulitis and abscess of the left foot he was given Silvadene. After her third visit to the ER and a visit to podiatry [Dr. Price] he was referred here. He has been using Silvadene. The patient thinks that he was bitten by something initially, but he did not actually see this The patient has a history of type 2 diabetes although he is not currently on anything for this and his hemoglobin A1c was 5; COPD, dilated cardiomyopathy, mitral regurgitation, CHF, remote CVA with left-sided hemiparesis, hypertension ABI in our clinic was not easy to obtain although we were able to get 0.93 on the left 6/29; I did a debridement on this last time however once again it he comes in with a mostly black eschar at surface. Very tender. We have not yet managed to get arterial studies on him. I will see if I can get them done through Dr. Kennon Holter office 7/9;  his arterial studies were done on 7/1. On the right side his studies are not too bad with noncompressible ABIs at the PTA great TBI of 0.64 and monophasic waveforms on the left however an ABI could not be obtained at the ATA, at the PTA his ABI was 0.2 with a dampened monophasic waveforms there was no pressure in the great toe. He is developed a gangrenous-looking second toes since the last time I saw this patient. There is an ulcer on the medial aspect the second toe. The area on his heel is declining 7/21; since the patient was last here he was revascularized by Nathaniel White on 07/21/2018. Noteworthy on the left that the SFA was approximately 50% stenosed at the takeoff but this was not treated. Popliteal artery had heavy areas of calcification and included in one segment and reconstituted just beyond this. This area was stented. After stenting there was 0% residual stenosis. It was noted he appeared to have three-vessel runoff via the tibials which are heavily calcified but without focal occlusions. The patient does not complain of pain. The area on the left lateral heel is a deep necrotic wound it does not look any more viable this week. He has a gangrenous second toe which I do not think is going to be viable. Finally he has a new area on the left posterior calf 7/28; patient arrives with the mummified second toe. The large necrotic wound on the left lateral calcaneus is separating black eschar. He had a new area superiorly on the posterior calf. Home health could not get Iodoflex and he arrived with  calcium alginate. He has been revascularized by Nathaniel White patient is not aware when his follow-up appointment is. 8/4-Patient is back to the clinic with the mummified second toe on the left, left calcaneal ulcer with black eschar and visible tendon. His appointment with Nathaniel White is on the 14th of this month. I have indicated to him that this is important to keep 8/11-8/11-Patient comes back for his  left medial ankle wound, the more distal wound is bigger, has a lot of necrotic tissue, greenish drainage, eschar coating a portion of the wound 8/18; I believe the patient has had follow-up arterial studies ordered by Nathaniel White but I do not think they have been done and the patient and his friend are not aware of an appointment time. He has a mummified left second toe and a small area on the left posterior calf in the mid aspect. This substantial area is on the left Achilles area extending into the left lateral heel. This is a deep necrotic wound. It no longer has the black gangrenous surface. His friend says that there is still a malodorous discharge. I note that Nathaniel White did a culture of this area on 8/12. This grew Enterococcus faecalis and E. coli. He is started on Augmentin although the E. coli only has intermediate sensitivities here. He was referred to infectious disease. We could add either a first or third generation cephalosporin which should cover the E. coli although I did not do this today. 8/25; the patient has a mummified left second toe a substantial wound over the left Achilles area with exposed tendon. And a small area on the left posterior calf. He has been revascularized by Nathaniel White in July. He has not made it for his follow-up noninvasive studies. X-ray I did of the left heel last week showed soft tissue gas along the posterior aspect of the heel and soft tissue defect and findings suspicious for osteomyelitis involving the distal aspect of the second proximal phalanx. This is the mummified toe. The patient went to see Nathaniel White of infectious disease. I am not able to really follow the flow of his note but according to when I am seeing he did not add or suggest any additional antibiotics unless he called these into dialysis. 9/1; I was able to review Nathaniel White notes he did not feel any further antibiotics were indicated. I still do not see where the patient had his  postprocedure noninvasive studies. We have been using silver collagen to the substantial area on the left Achilles. This has exposed tendon. He has a mummified left second toe. I am doubtful there is any viable tissue here. The patient is nonambulatory. He has flexion contractures of his left hip and knee. 10/1; the patient has not been here in a month I am really not sure why. He was seen by vein and vascular on 9/25 he had his noninvasive studies done. His monophasic waveforms on the left were noted. He had Dopplers across the stent which was felt to be patent. The patient was seen by Dagoberto Ligas PAC. The arterial duplex today demonstrated a patent popliteal stent with improved TBI. Apparently his caregiver demanded an amputation but this was not felt to be necessary. He is on aspirin and Plavix. Dressings are being changed by home health he was using Prisma. 10/9; patient sees the vascular next week. We will call urgently this morning by home health report that the mom applied left second toe had "almost fallen off". Nevertheless the  patient arrived in my clinic today. He has a lateral left heel wound. 10/16; substantial wound on the left lateral heel extending into the Achilles area. Also a mummified area on the left second toe. I contacted Nathaniel White about that earlier in the week. He agrees that the left second toe needs an amputation. Apparently noninvasive Doppler tests in Nathaniel White office showed good blood flow according to the patient although I did not see this in the note 11/9; substantial wound on the left lateral heel extending to the Achilles area. The left second toe was amputated by Nathaniel White on 10/28. I did not review this area. 12/4; patient has not been here in a month. He has home health. The left second toe amputation site has closed over. The area on the lateral part of his heel extending into the Achilles is a lot better. Thick surrounding material around the wound edge  will inhibit final healing however. 12/28; patient has not been here in a month. He has home health. We are using Hydrofera Blue to the area on the left heel area. He has healed over the left second toe amputation site. Electronic Signature(s) Signed: 01/05/2019 6:10:19 PM By: Linton Ham MD Entered By: Linton Ham on 01/05/2019 17:55:24 -------------------------------------------------------------------------------- Physical Exam Details Patient Name: Date of Service: AXTYN, WOEHLER 01/05/2019 3:15 PM Medical Record [id number] Patient Account Number: [id number] Date of Birth/Sex: Treating RN: 1960/12/06 (58 y.o. M) Primary Care Provider: Other Clinician: Geryl White Referring Provider: Treating Provider/Extender:Ziyon Soltau, Nathaniel White, [name] in Treatment: 27 Constitutional Sitting or standing Blood Pressure is within target range for patient.. Pulse regular and within target range for patient.Marland Kitchen Respirations regular, non-labored and within target range.. Temperature is normal and within the target range for the patient.Marland Kitchen Appears in no distress. Respiratory work of breathing is normal. Cardiovascular Pedal pulses palpable on the left. Integumentary (Hair, Skin) There is no erythema around the wound but a surprising amount of thick callus. Psychiatric appears at normal baseline. Notes Wound exam; left third toe amputation site is closed although there is a lot of callus in this area I did not debride. The area on the left lateral heel is down to a smaller healthier looking wound. There is epithelialization. Circumferential callus present I did not remove this as it does not appear to be impeding epithelialization Electronic Signature(s) Signed: 01/05/2019 6:10:19 PM By: Linton Ham MD Entered By: Linton Ham on 01/05/2019 17:57:16 -------------------------------------------------------------------------------- Physician Orders Details Patient  Name: Date of Service: KAYNAN, KLONOWSKI 01/05/2019 3:15 PM Medical Record [id number] Patient Account Number: [id number] Date of Birth/Sex: Treating RN: 1960-10-16 (58 y.o. [name] Primary Care Provider: Geryl White Other Clinician: Referring Provider: Treating Provider/Extender:Claudis Giovanelli, Nathaniel White, Nathaniel White in Treatment: 27 Verbal / Phone Orders: No Diagnosis Coding ICD-10 Coding Code Description E11.621 Type 2 diabetes mellitus with foot ulcer E11.52 Type 2 diabetes mellitus with diabetic peripheral angiopathy with gangrene I87.333 Chronic venous hypertension (idiopathic) with ulcer and inflammation of bilateral lower extremity L97.423 Non-pressure chronic ulcer of left heel and midfoot with necrosis of muscle L97.221 Non-pressure chronic ulcer of left calf limited to breakdown of skin Follow-up Appointments Return Appointment in 2 weeks. Dressing Change Frequency Wound #1 Left,Lateral Calcaneus Change dressing three times week. Wound Cleansing Wound #1 Left,Lateral Calcaneus Clean wound with Wound Cleanser - or normal saline Primary Wound Dressing Wound #1 Left,Lateral Calcaneus Hydrofera Blue Secondary Dressing Wound #1 Left,Lateral Calcaneus Kerlix/Rolled Gauze Dry Gauze ABD pad Off-Loading Other: - float feet off of bed/chair  by placing pillow under legs [place] skilled nursing for wound care. - [facility] Electronic Signature(s) Signed: 01/05/2019 6:10:06 PM By: Levan Hurst RN, BSN Signed: 01/05/2019 6:10:19 PM By: Linton Ham MD Entered By: Levan Hurst on 01/05/2019 17:09:54 -------------------------------------------------------------------------------- Problem List Details Patient Name: Date of Service: DRELYN, PISTILLI 01/05/2019 3:15 PM Medical Record [id number] Patient Account Number: [id number] Date of Birth/Sex: Treating RN: 12/31/1960 (58 y.o. [name] Primary Care Provider:  Geryl White Other Clinician: Referring Provider: Treating Provider/Extender:Belen Zwahlen, Nathaniel White, Nathaniel White in Treatment: 27 Active Problems ICD-10 Evaluated Encounter Code Description Active Date Today Diagnosis E11.621 Type 2 diabetes mellitus with foot ulcer 06/30/2018 No Yes E11.52 Type 2 diabetes mellitus with diabetic peripheral 07/17/2018 No Yes angiopathy with gangrene I87.333 Chronic venous hypertension (idiopathic) with ulcer 06/30/2018 No Yes and inflammation of bilateral lower extremity L97.423 Non-pressure chronic ulcer of left heel and midfoot 07/29/2018 No Yes with necrosis of muscle L97.221 Non-pressure chronic ulcer of left calf limited to 07/29/2018 No Yes breakdown of skin Inactive Problems ICD-10 Code Description Active Date Inactive Date E11.51 Type 2 diabetes mellitus with diabetic peripheral angiopathy 06/30/2018 06/30/2018 without gangrene Resolved Problems Electronic Signature(s) Signed: 01/05/2019 6:10:19 PM By: Linton Ham MD Entered By: Linton Ham on 01/05/2019 17:53:15 -------------------------------------------------------------------------------- Progress Note Details Patient Name: Date of Service: [name] 01/05/2019 3:15 PM Medical Record [id number] Patient Account Number: [id number] Date of Birth/Sex: Treating RN: October 03, 1960 (58 y.o. M) Primary Care Provider: Geryl White Other Clinician: Referring Provider: Treating Provider/Extender:France Lusty, Nathaniel White, Nathaniel White in Treatment: 27 Subjective History of Present Illness (HPI) ADMISSION 06/30/2018 The patient is a 58 year old man with multiple medical issues including type 2 diabetes on dialysis. Currently the patient his dialysis is actually caused by nephrosclerosis and he has been on dialysis for 20 years. Roughly a month ago according to the patient he noticed a small open area on his left heel he showed it to his caregiver and according to her this  rapidly expanded. Looking through [facility] health link he was in the ER with left foot pain on 4/3. An x-ray was negative noting only pedal vessel atherosclerosis. He was also seen on 4/22 at that point he was felt to have a cellulitis and abscess of the left foot he was given Silvadene. After her third visit to the ER and a visit to podiatry [Dr. Price] he was referred here. He has been using Silvadene. The patient thinks that he was bitten by something initially, but he did not actually see this The patient has a history of type 2 diabetes although he is not currently on anything for this and his hemoglobin A1c was 5; COPD, dilated cardiomyopathy, mitral regurgitation, CHF, remote CVA with left-sided hemiparesis, hypertension ABI in our clinic was not easy to obtain although we were able to get 0.93 on the left 6/29; I did a debridement on this last time however once again it he comes in with a mostly black eschar at surface. Very tender. We have not yet managed to get arterial studies on him. I will see if I can get them done through Dr. Kennon Holter office 7/9; his arterial studies were done on 7/1. On the right side his studies are not too bad with noncompressible ABIs at the PTA great TBI of 0.64 and monophasic waveforms on the left however an ABI could not be obtained at the ATA, at the PTA his ABI was 0.2 with a dampened monophasic waveforms there was no pressure in the  great toe. He is developed a gangrenous-looking second toes since the last time I saw this patient. There is an ulcer on the medial aspect the second toe. The area on his heel is declining 7/21; since the patient was last here he was revascularized by Nathaniel White on 07/21/2018. Noteworthy on the left that the SFA was approximately 50% stenosed at the takeoff but this was not treated. Popliteal artery had heavy areas of calcification and included in one segment and reconstituted just beyond this. This area was stented. After stenting  there was 0% residual stenosis. It was noted he appeared to have three-vessel runoff via the tibials which are heavily calcified but without focal occlusions. The patient does not complain of pain. The area on the left lateral heel is a deep necrotic wound it does not look any more viable this week. He has a gangrenous second toe which I do not think is going to be viable. Finally he has a new area on the left posterior calf 7/28; patient arrives with the mummified second toe. The large necrotic wound on the left lateral calcaneus is separating black eschar. He had a new area superiorly on the posterior calf. Home health could not get Iodoflex and he arrived with calcium alginate. He has been revascularized by Nathaniel White patient is not aware when his follow-up appointment is. 8/4-Patient is back to the clinic with the mummified second toe on the left, left calcaneal ulcer with black eschar and visible tendon. His appointment with Nathaniel White is on the 14th of this month. I have indicated to him that this is important to keep 8/11-8/11-Patient comes back for his left medial ankle wound, the more distal wound is bigger, has a lot of necrotic tissue, greenish drainage, eschar coating a portion of the wound 8/18; I believe the patient has had follow-up arterial studies ordered by Nathaniel White but I do not think they have been done and the patient and his friend are not aware of an appointment time. He has a mummified left second toe and a small area on the left posterior calf in the mid aspect. This substantial area is on the left Achilles area extending into the left lateral heel. This is a deep necrotic wound. It no longer has the black gangrenous surface. His friend says that there is still a malodorous discharge. I note that Nathaniel White did a culture of this area on 8/12. This grew Enterococcus faecalis and E. coli. He is started on Augmentin although the E. coli only has intermediate sensitivities here.  He was referred to infectious disease. We could add either a first or third generation cephalosporin which should cover the E. coli although I did not do this today. 8/25; the patient has a mummified left second toe a substantial wound over the left Achilles area with exposed tendon. And a small area on the left posterior calf. He has been revascularized by Nathaniel White in July. He has not made it for his follow-up noninvasive studies. X-ray I did of the left heel last week showed soft tissue gas along the posterior aspect of the heel and soft tissue defect and findings suspicious for osteomyelitis involving the distal aspect of the second proximal phalanx. This is the mummified toe. The patient went to see Nathaniel White of infectious disease. I am not able to really follow the flow of his note but according to when I am seeing he did not add or suggest any additional antibiotics unless he called these  into dialysis. 9/1; I was able to review Nathaniel White notes he did not feel any further antibiotics were indicated. I still do not see where the patient had his postprocedure noninvasive studies. We have been using silver collagen to the substantial area on the left Achilles. This has exposed tendon. He has a mummified left second toe. I am doubtful there is any viable tissue here. The patient is nonambulatory. He has flexion contractures of his left hip and knee. 10/1; the patient has not been here in a month I am really not sure why. He was seen by vein and vascular on 9/25 he had his noninvasive studies done. His monophasic waveforms on the left were noted. He had Dopplers across the stent which was felt to be patent. The patient was seen by Dagoberto Ligas PAC. The arterial duplex today demonstrated a patent popliteal stent with improved TBI. Apparently his caregiver demanded an amputation but this was not felt to be necessary. He is on aspirin and Plavix. Dressings are being changed by home health he  was using Prisma. 10/9; patient sees the vascular next week. We will call urgently this morning by home health report that the mom applied left second toe had "almost fallen off". Nevertheless the patient arrived in my clinic today. He has a lateral left heel wound. 10/16; substantial wound on the left lateral heel extending into the Achilles area. Also a mummified area on the left second toe. I contacted Nathaniel White about that earlier in the week. He agrees that the left second toe needs an amputation. Apparently noninvasive Doppler tests in Nathaniel White office showed good blood flow according to the patient although I did not see this in the note 11/9; substantial wound on the left lateral heel extending to the Achilles area. The left second toe was amputated by Nathaniel White on 10/28. I did not review this area. 12/4; patient has not been here in a month. He has home health. The left second toe amputation site has closed over. The area on the lateral part of his heel extending into the Achilles is a lot better. Thick surrounding material around the wound edge will inhibit final healing however. 12/28; patient has not been here in a month. He has home health. We are using Hydrofera Blue to the area on the left heel area. He has healed over the left second toe amputation site. Objective Constitutional Sitting or standing Blood Pressure is within target range for patient.. Pulse regular and within target range for patient.Marland Kitchen Respirations regular, non-labored and within target range.. Temperature is normal and within the target range for the patient.Marland Kitchen Appears in no distress. Vitals Time Taken: 4:19 PM, Height: 69 in, Weight: 170 lbs, BMI: 25.1, Temperature: 97.8 F, Pulse: 101 bpm, Respiratory Rate: 18 breaths/min, Blood Pressure: 118/79 mmHg. Respiratory work of breathing is normal. Cardiovascular Pedal pulses palpable on the left. Psychiatric appears at normal baseline. General Notes: Wound exam;  left third toe amputation site is closed although there is a lot of callus in this area I did not debride. The area on the left lateral heel is down to a smaller healthier looking wound. There is epithelialization. Circumferential callus present I did not remove this as it does not appear to be impeding epithelialization Integumentary (Hair, Skin) There is no erythema around the wound but a surprising amount of thick callus. Wound #1 status is Open. Original cause of wound was Gradually Appeared. The wound is located on the Left,Lateral Calcaneus. The wound measures  4.5cm length x 2.1cm width x 0.1cm depth; 7.422cm^2 area and 0.742cm^3 volume. There is Fat Layer (Subcutaneous Tissue) Exposed exposed. There is no tunneling or undermining noted. There is a medium amount of serosanguineous drainage noted. The wound margin is well defined and not attached to the wound base. There is large (67-100%) red, pink granulation within the wound bed. There is no necrotic tissue within the wound bed. Assessment Active Problems ICD-10 Type 2 diabetes mellitus with foot ulcer Type 2 diabetes mellitus with diabetic peripheral angiopathy with gangrene Chronic venous hypertension (idiopathic) with ulcer and inflammation of bilateral lower extremity Non-pressure chronic ulcer of left heel and midfoot with necrosis of muscle Non-pressure chronic ulcer of left calf limited to breakdown of skin Plan Follow-up Appointments: Return Appointment in 2 weeks. Dressing Change Frequency: Wound #1 Left,Lateral Calcaneus: Change dressing three times week. Wound Cleansing: Wound #1 Left,Lateral Calcaneus: Clean wound with Wound Cleanser - or normal saline Primary Wound Dressing: Wound #1 Left,Lateral Calcaneus: Hydrofera Blue Secondary Dressing: Wound #1 Left,Lateral Calcaneus: Kerlix/Rolled Gauze Dry Gauze ABD pad Off-Loading: Other: - float feet off of bed/chair by placing pillow under legs Home  Health: [place] skilled [facility] for wound care. - [facility] 1. Continue Hydrofera Blue as the primary dressing 2. No evidence of infection or ischemia. He has been revascularized Electronic Signature(s) Signed: 01/05/2019 6:10:19 PM By: Linton Ham MD Entered By: Linton Ham on 01/05/2019 17:57:56 -------------------------------------------------------------------------------- SuperBill Details Patient Name: Date of Service: [name] 01/05/2019 Medical Record [id number] Patient Account Number: [id number] Date of Birth/Sex: Treating RN: 19-Oct-1960 (58 y.o. 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 Facility Procedures CPT4 Code: [id number] Description: 99213 - WOUND CARE VISIT-LEV 3 EST PT Modifier: Quantity: 1 Physician Procedures CPT4 Code Description: [id number] - WC PHYS LEVEL 3 - EST PT ICD-10 Diagnosis Description E11.621 Type 2 diabetes mellitus with foot ulcer L97.423 Non-pressure chronic ulcer of left heel and midfoot with Modifier: necrosis of musc Quantity: 1 le Electronic Signature(s) Signed: 01/05/2019 6:10:06 PM By: Levan Hurst RN, BSN Signed: 01/06/2019 6:15:30 PM By: Linton Ham MD Previous Signature: 01/05/2019 6:10:19 PM Version By: Linton Ham MD Entered By: Levan Hurst on 01/05/2019 18:09:48

## 2019-01-07 DIAGNOSIS — E11621 Type 2 diabetes mellitus with foot ulcer: Secondary | ICD-10-CM | POA: Diagnosis not present

## 2019-01-07 DIAGNOSIS — E1122 Type 2 diabetes mellitus with diabetic chronic kidney disease: Secondary | ICD-10-CM | POA: Diagnosis not present

## 2019-01-07 DIAGNOSIS — L97422 Non-pressure chronic ulcer of left heel and midfoot with fat layer exposed: Secondary | ICD-10-CM | POA: Diagnosis not present

## 2019-01-07 DIAGNOSIS — L97522 Non-pressure chronic ulcer of other part of left foot with fat layer exposed: Secondary | ICD-10-CM | POA: Diagnosis not present

## 2019-01-07 DIAGNOSIS — E1151 Type 2 diabetes mellitus with diabetic peripheral angiopathy without gangrene: Secondary | ICD-10-CM | POA: Diagnosis not present

## 2019-01-07 DIAGNOSIS — Z4800 Encounter for change or removal of nonsurgical wound dressing: Secondary | ICD-10-CM | POA: Diagnosis not present

## 2019-01-08 ENCOUNTER — Other Ambulatory Visit (HOSPITAL_COMMUNITY): Payer: Medicare Other

## 2019-01-08 DIAGNOSIS — E11621 Type 2 diabetes mellitus with foot ulcer: Secondary | ICD-10-CM | POA: Diagnosis not present

## 2019-01-08 DIAGNOSIS — E1122 Type 2 diabetes mellitus with diabetic chronic kidney disease: Secondary | ICD-10-CM | POA: Diagnosis not present

## 2019-01-08 DIAGNOSIS — Z4800 Encounter for change or removal of nonsurgical wound dressing: Secondary | ICD-10-CM | POA: Diagnosis not present

## 2019-01-08 DIAGNOSIS — L97522 Non-pressure chronic ulcer of other part of left foot with fat layer exposed: Secondary | ICD-10-CM | POA: Diagnosis not present

## 2019-01-08 DIAGNOSIS — L97422 Non-pressure chronic ulcer of left heel and midfoot with fat layer exposed: Secondary | ICD-10-CM | POA: Diagnosis not present

## 2019-01-08 DIAGNOSIS — E1151 Type 2 diabetes mellitus with diabetic peripheral angiopathy without gangrene: Secondary | ICD-10-CM | POA: Diagnosis not present

## 2019-01-09 DIAGNOSIS — T8612 Kidney transplant failure: Secondary | ICD-10-CM | POA: Diagnosis not present

## 2019-01-09 DIAGNOSIS — Z992 Dependence on renal dialysis: Secondary | ICD-10-CM | POA: Diagnosis not present

## 2019-01-09 DIAGNOSIS — N186 End stage renal disease: Secondary | ICD-10-CM | POA: Diagnosis not present

## 2019-01-11 DIAGNOSIS — Z992 Dependence on renal dialysis: Secondary | ICD-10-CM | POA: Diagnosis not present

## 2019-01-11 DIAGNOSIS — N186 End stage renal disease: Secondary | ICD-10-CM | POA: Diagnosis not present

## 2019-01-11 DIAGNOSIS — E119 Type 2 diabetes mellitus without complications: Secondary | ICD-10-CM | POA: Diagnosis not present

## 2019-01-11 DIAGNOSIS — N2581 Secondary hyperparathyroidism of renal origin: Secondary | ICD-10-CM | POA: Diagnosis not present

## 2019-01-12 DIAGNOSIS — E11621 Type 2 diabetes mellitus with foot ulcer: Secondary | ICD-10-CM | POA: Diagnosis not present

## 2019-01-12 DIAGNOSIS — Z4800 Encounter for change or removal of nonsurgical wound dressing: Secondary | ICD-10-CM | POA: Diagnosis not present

## 2019-01-12 DIAGNOSIS — L97522 Non-pressure chronic ulcer of other part of left foot with fat layer exposed: Secondary | ICD-10-CM | POA: Diagnosis not present

## 2019-01-12 DIAGNOSIS — L97422 Non-pressure chronic ulcer of left heel and midfoot with fat layer exposed: Secondary | ICD-10-CM | POA: Diagnosis not present

## 2019-01-12 DIAGNOSIS — E1151 Type 2 diabetes mellitus with diabetic peripheral angiopathy without gangrene: Secondary | ICD-10-CM | POA: Diagnosis not present

## 2019-01-12 DIAGNOSIS — E1122 Type 2 diabetes mellitus with diabetic chronic kidney disease: Secondary | ICD-10-CM | POA: Diagnosis not present

## 2019-01-13 DIAGNOSIS — N2581 Secondary hyperparathyroidism of renal origin: Secondary | ICD-10-CM | POA: Diagnosis not present

## 2019-01-13 DIAGNOSIS — Z992 Dependence on renal dialysis: Secondary | ICD-10-CM | POA: Diagnosis not present

## 2019-01-13 DIAGNOSIS — E119 Type 2 diabetes mellitus without complications: Secondary | ICD-10-CM | POA: Diagnosis not present

## 2019-01-13 DIAGNOSIS — N186 End stage renal disease: Secondary | ICD-10-CM | POA: Diagnosis not present

## 2019-01-14 DIAGNOSIS — L97422 Non-pressure chronic ulcer of left heel and midfoot with fat layer exposed: Secondary | ICD-10-CM | POA: Diagnosis not present

## 2019-01-14 DIAGNOSIS — E1151 Type 2 diabetes mellitus with diabetic peripheral angiopathy without gangrene: Secondary | ICD-10-CM | POA: Diagnosis not present

## 2019-01-14 DIAGNOSIS — E1122 Type 2 diabetes mellitus with diabetic chronic kidney disease: Secondary | ICD-10-CM | POA: Diagnosis not present

## 2019-01-14 DIAGNOSIS — E11621 Type 2 diabetes mellitus with foot ulcer: Secondary | ICD-10-CM | POA: Diagnosis not present

## 2019-01-14 DIAGNOSIS — L97522 Non-pressure chronic ulcer of other part of left foot with fat layer exposed: Secondary | ICD-10-CM | POA: Diagnosis not present

## 2019-01-14 DIAGNOSIS — Z4800 Encounter for change or removal of nonsurgical wound dressing: Secondary | ICD-10-CM | POA: Diagnosis not present

## 2019-01-15 DIAGNOSIS — L97422 Non-pressure chronic ulcer of left heel and midfoot with fat layer exposed: Secondary | ICD-10-CM | POA: Diagnosis not present

## 2019-01-15 DIAGNOSIS — E1151 Type 2 diabetes mellitus with diabetic peripheral angiopathy without gangrene: Secondary | ICD-10-CM | POA: Diagnosis not present

## 2019-01-15 DIAGNOSIS — Z4800 Encounter for change or removal of nonsurgical wound dressing: Secondary | ICD-10-CM | POA: Diagnosis not present

## 2019-01-15 DIAGNOSIS — N2581 Secondary hyperparathyroidism of renal origin: Secondary | ICD-10-CM | POA: Diagnosis not present

## 2019-01-15 DIAGNOSIS — Z992 Dependence on renal dialysis: Secondary | ICD-10-CM | POA: Diagnosis not present

## 2019-01-15 DIAGNOSIS — E11621 Type 2 diabetes mellitus with foot ulcer: Secondary | ICD-10-CM | POA: Diagnosis not present

## 2019-01-15 DIAGNOSIS — E119 Type 2 diabetes mellitus without complications: Secondary | ICD-10-CM | POA: Diagnosis not present

## 2019-01-15 DIAGNOSIS — L97522 Non-pressure chronic ulcer of other part of left foot with fat layer exposed: Secondary | ICD-10-CM | POA: Diagnosis not present

## 2019-01-15 DIAGNOSIS — E1122 Type 2 diabetes mellitus with diabetic chronic kidney disease: Secondary | ICD-10-CM | POA: Diagnosis not present

## 2019-01-15 DIAGNOSIS — N186 End stage renal disease: Secondary | ICD-10-CM | POA: Diagnosis not present

## 2019-01-17 DIAGNOSIS — E119 Type 2 diabetes mellitus without complications: Secondary | ICD-10-CM | POA: Diagnosis not present

## 2019-01-17 DIAGNOSIS — N2581 Secondary hyperparathyroidism of renal origin: Secondary | ICD-10-CM | POA: Diagnosis not present

## 2019-01-17 DIAGNOSIS — N186 End stage renal disease: Secondary | ICD-10-CM | POA: Diagnosis not present

## 2019-01-17 DIAGNOSIS — Z992 Dependence on renal dialysis: Secondary | ICD-10-CM | POA: Diagnosis not present

## 2019-01-19 ENCOUNTER — Other Ambulatory Visit: Payer: Self-pay

## 2019-01-19 ENCOUNTER — Encounter (HOSPITAL_BASED_OUTPATIENT_CLINIC_OR_DEPARTMENT_OTHER): Payer: Medicare Other | Attending: Internal Medicine | Admitting: Internal Medicine

## 2019-01-19 DIAGNOSIS — J449 Chronic obstructive pulmonary disease, unspecified: Secondary | ICD-10-CM | POA: Diagnosis not present

## 2019-01-19 DIAGNOSIS — Z89422 Acquired absence of other left toe(s): Secondary | ICD-10-CM | POA: Insufficient documentation

## 2019-01-19 DIAGNOSIS — Z4800 Encounter for change or removal of nonsurgical wound dressing: Secondary | ICD-10-CM | POA: Diagnosis not present

## 2019-01-19 DIAGNOSIS — R4182 Altered mental status, unspecified: Secondary | ICD-10-CM | POA: Insufficient documentation

## 2019-01-19 DIAGNOSIS — L97422 Non-pressure chronic ulcer of left heel and midfoot with fat layer exposed: Secondary | ICD-10-CM | POA: Diagnosis not present

## 2019-01-19 DIAGNOSIS — E11621 Type 2 diabetes mellitus with foot ulcer: Secondary | ICD-10-CM | POA: Insufficient documentation

## 2019-01-19 DIAGNOSIS — N186 End stage renal disease: Secondary | ICD-10-CM | POA: Diagnosis not present

## 2019-01-19 DIAGNOSIS — I509 Heart failure, unspecified: Secondary | ICD-10-CM | POA: Insufficient documentation

## 2019-01-19 DIAGNOSIS — Z992 Dependence on renal dialysis: Secondary | ICD-10-CM | POA: Diagnosis not present

## 2019-01-19 DIAGNOSIS — Z7982 Long term (current) use of aspirin: Secondary | ICD-10-CM | POA: Diagnosis not present

## 2019-01-19 DIAGNOSIS — E1152 Type 2 diabetes mellitus with diabetic peripheral angiopathy with gangrene: Secondary | ICD-10-CM | POA: Insufficient documentation

## 2019-01-19 DIAGNOSIS — I340 Nonrheumatic mitral (valve) insufficiency: Secondary | ICD-10-CM | POA: Insufficient documentation

## 2019-01-19 DIAGNOSIS — I132 Hypertensive heart and chronic kidney disease with heart failure and with stage 5 chronic kidney disease, or end stage renal disease: Secondary | ICD-10-CM | POA: Diagnosis not present

## 2019-01-19 DIAGNOSIS — Z7902 Long term (current) use of antithrombotics/antiplatelets: Secondary | ICD-10-CM | POA: Diagnosis not present

## 2019-01-19 DIAGNOSIS — L97522 Non-pressure chronic ulcer of other part of left foot with fat layer exposed: Secondary | ICD-10-CM | POA: Diagnosis not present

## 2019-01-19 DIAGNOSIS — I87333 Chronic venous hypertension (idiopathic) with ulcer and inflammation of bilateral lower extremity: Secondary | ICD-10-CM | POA: Diagnosis not present

## 2019-01-19 DIAGNOSIS — L97423 Non-pressure chronic ulcer of left heel and midfoot with necrosis of muscle: Secondary | ICD-10-CM | POA: Insufficient documentation

## 2019-01-19 DIAGNOSIS — I69354 Hemiplegia and hemiparesis following cerebral infarction affecting left non-dominant side: Secondary | ICD-10-CM | POA: Diagnosis not present

## 2019-01-19 DIAGNOSIS — E1122 Type 2 diabetes mellitus with diabetic chronic kidney disease: Secondary | ICD-10-CM | POA: Insufficient documentation

## 2019-01-19 DIAGNOSIS — E1151 Type 2 diabetes mellitus with diabetic peripheral angiopathy without gangrene: Secondary | ICD-10-CM | POA: Diagnosis not present

## 2019-01-19 DIAGNOSIS — L97221 Non-pressure chronic ulcer of left calf limited to breakdown of skin: Secondary | ICD-10-CM | POA: Diagnosis not present

## 2019-01-20 DIAGNOSIS — E119 Type 2 diabetes mellitus without complications: Secondary | ICD-10-CM | POA: Diagnosis not present

## 2019-01-20 DIAGNOSIS — N186 End stage renal disease: Secondary | ICD-10-CM | POA: Diagnosis not present

## 2019-01-20 DIAGNOSIS — N2581 Secondary hyperparathyroidism of renal origin: Secondary | ICD-10-CM | POA: Diagnosis not present

## 2019-01-20 DIAGNOSIS — Z992 Dependence on renal dialysis: Secondary | ICD-10-CM | POA: Diagnosis not present

## 2019-01-20 NOTE — Progress Notes (Signed)
Nathaniel White, Nathaniel White ([id number]) Visit Report for 01/19/2019 Debridement Details Patient Name: Date of Service: Nathaniel White, Nathaniel White 01/19/2019 2:30 PM Medical Record [id number] Patient Account Number: [id number] Date of Birth/Sex: Treating RN: Dec 21, 1960 (59 y.o. M) Primary Care Provider: Geryl Rankins Other Clinician: Referring Provider: Treating Provider/Extender:Antario Yasuda, Rodrigo Ran, Paulette Blanch in Treatment: 29 Debridement Performed for Wound #1 Left,Lateral Calcaneus Assessment: Performed By: Physician Ricard Dillon., MD Debridement Type: Debridement Severity of Tissue Pre Fat layer exposed Debridement: Level of Consciousness (Pre- Awake and Alert procedure): Pre-procedure Verification/Time Out Taken: Yes - 15:35 Start Time: 15:35 Total Area Debrided (L x W): 2.5 (cm) x 0.6 (cm) = 1.5 (cm) Tissue and other material Viable, Non-Viable, Callus, Subcutaneous, Skin: Epidermis debrided: Level: Skin/Subcutaneous Tissue Debridement Description: Excisional Instrument: Blade, Forceps Bleeding: Moderate Hemostasis Achieved: Silver Nitrate End Time: 15:37 Procedural Pain: 0 Post Procedural Pain: 0 Response to Treatment: Procedure was tolerated well Level of Consciousness Awake and Alert (Post-procedure): Post Debridement Measurements of Total Wound Length: (cm) 2.5 Width: (cm) 0.6 Depth: (cm) 0.1 Volume: (cm) 0.118 Character of Wound/Ulcer Post Improved Debridement: Severity of Tissue Post Debridement: Fat layer exposed Post Procedure Diagnosis Same as Pre-procedure Electronic Signature(s) Signed: 01/19/2019 6:04:26 PM By: Linton Ham MD Entered By: Linton Ham on 01/19/2019 16:07:02 -------------------------------------------------------------------------------- HPI Details Patient Name: Date of Service: Nathaniel White 01/19/2019 2:30 PM Medical Record [id number] Patient Account Number: [id number] Date of Birth/Sex: Treating RN: Aug 02, 1960  (59 y.o. M) Primary Care Provider: Geryl Rankins Other Clinician: Referring Provider: Treating Provider/Extender:Breyana Follansbee, Rodrigo Ran, Paulette Blanch in Treatment: 29 History of Present Illness HPI Description: ADMISSION 06/30/2018 The patient is a 59 year old man with multiple medical issues including type 2 diabetes on dialysis. Currently the patient his dialysis is actually caused by nephrosclerosis and he has been on dialysis for 20 years. Roughly a month ago according to the patient he noticed a small open area on his left heel he showed it to his caregiver and according to her this rapidly expanded. Looking through [facility] health link he was in the ER with left foot pain on 4/3. An x-ray was negative noting only pedal vessel atherosclerosis. He was also seen on 4/22 at that point he was felt to have a cellulitis and abscess of the left foot he was given Silvadene. After her third visit to the ER and a visit to podiatry [Dr. Price] he was referred here. He has been using Silvadene. The patient thinks that he was bitten by something initially, but he did not actually see this The patient has a history of type 2 diabetes although he is not currently on anything for this and his hemoglobin A1c was 5; COPD, dilated cardiomyopathy, mitral regurgitation, CHF, remote CVA with left-sided hemiparesis, hypertension ABI in our clinic was not easy to obtain although we were able to get 0.93 on the left 6/29; I did a debridement on this last time however once again it he comes in with a mostly black eschar at surface. Very tender. We have not yet managed to get arterial studies on him. I will see if I can get them done through Dr. Kennon Holter office 7/9; his arterial studies were done on 7/1. On the right side his studies are not too bad with noncompressible ABIs at the PTA great TBI of 0.64 and monophasic waveforms on the left however an ABI could not be obtained at the ATA, at the PTA his ABI was 0.2  with a dampened monophasic waveforms there was no pressure in the great  toe. He is developed a gangrenous-looking second toes since the last time I saw this patient. There is an ulcer on the medial aspect the second toe. The area on his heel is declining 7/21; since the patient was last here he was revascularized by Dr. Donzetta Matters on 07/21/2018. Noteworthy on the left that the SFA was approximately 50% stenosed at the takeoff but this was not treated. Popliteal artery had heavy areas of calcification and included in one segment and reconstituted just beyond this. This area was stented. After stenting there was 0% residual stenosis. It was noted he appeared to have three-vessel runoff via the tibials which are heavily calcified but without focal occlusions. The patient does not complain of pain. The area on the left lateral heel is a deep necrotic wound it does not look any more viable this week. He has a gangrenous second toe which I do not think is going to be viable. Finally he has a new area on the left posterior calf 7/28; patient arrives with the mummified second toe. The large necrotic wound on the left lateral calcaneus is separating black eschar. He had a new area superiorly on the posterior calf. Home health could not get Iodoflex and he arrived with calcium alginate. He has been revascularized by Dr. Donzetta Matters patient is not aware when his follow-up appointment is. 8/4-Patient is back to the clinic with the mummified second toe on the left, left calcaneal ulcer with black eschar and visible tendon. His appointment with Dr. Gwenlyn Saran is on the 14th of this month. I have indicated to him that this is important to keep 8/11-8/11-Patient comes back for his left medial ankle wound, the more distal wound is bigger, has a lot of necrotic tissue, greenish drainage, eschar coating a portion of the wound 8/18; I believe the patient has had follow-up arterial studies ordered by Dr. Donzetta Matters but I do not think they have  been done and the patient and his friend are not aware of an appointment time. He has a mummified left second toe and a small area on the left posterior calf in the mid aspect. This substantial area is on the left Achilles area extending into the left lateral heel. This is a deep necrotic wound. It no longer has the black gangrenous surface. His friend says that there is still a malodorous discharge. I note that Dr. Evette Doffing did a culture of this area on 8/12. This grew Enterococcus faecalis and E. coli. He is started on Augmentin although the E. coli only has intermediate sensitivities here. He was referred to infectious disease. We could add either a first or third generation cephalosporin which should cover the E. coli although I did not do this today. 8/25; the patient has a mummified left second toe a substantial wound over the left Achilles area with exposed tendon. And a small area on the left posterior calf. He has been revascularized by Dr. Donzetta Matters in July. He has not made it for his follow-up noninvasive studies. X-ray I did of the left heel last week showed soft tissue gas along the posterior aspect of the heel and soft tissue defect and findings suspicious for osteomyelitis involving the distal aspect of the second proximal phalanx. This is the mummified toe. The patient went to see Dr. Linus Salmons of infectious disease. I am not able to really follow the flow of his note but according to when I am seeing he did not add or suggest any additional antibiotics unless he called these into dialysis.  9/1; I was able to review Dr. Henreitta Leber notes he did not feel any further antibiotics were indicated. I still do not see where the patient had his postprocedure noninvasive studies. We have been using silver collagen to the substantial area on the left Achilles. This has exposed tendon. He has a mummified left second toe. I am doubtful there is any viable tissue here. The patient is nonambulatory. He has  flexion contractures of his left hip and knee. 10/1; the patient has not been here in a month I am really not sure why. He was seen by vein and vascular on 9/25 he had his noninvasive studies done. His monophasic waveforms on the left were noted. He had Dopplers across the stent which was felt to be patent. The patient was seen by Dagoberto Ligas PAC. The arterial duplex today demonstrated a patent popliteal stent with improved TBI. Apparently his caregiver demanded an amputation but this was not felt to be necessary. He is on aspirin and Plavix. Dressings are being changed by home health he was using Prisma. 10/9; patient sees the vascular next week. We will call urgently this morning by home health report that the mom applied left second toe had "almost fallen off". Nevertheless the patient arrived in my clinic today. He has a lateral left heel wound. 10/16; substantial wound on the left lateral heel extending into the Achilles area. Also a mummified area on the left second toe. I contacted Dr. Donzetta Matters about that earlier in the week. He agrees that the left second toe needs an amputation. Apparently noninvasive Doppler tests in Dr. Claretha Cooper office showed good blood flow according to the patient although I did not see this in the note 11/9; substantial wound on the left lateral heel extending to the Achilles area. The left second toe was amputated by Dr. Donzetta Matters on 10/28. I did not review this area. 12/4; patient has not been here in a month. He has home health. The left second toe amputation site has closed over. The area on the lateral part of his heel extending into the Achilles is a lot better. Thick surrounding material around the wound edge will inhibit final healing however. 12/28; patient has not been here in a month. He has home health. We are using Hydrofera Blue to the area on the left heel area. He has healed over the left second toe amputation site. 1/11; 2-week follow-up. The patient is  doing well his wound is down to a very small area. Copious amounts of callus and debris around the margins though. We have been using Hydrofera Blue. He remains healed over the left second toe amputation site Electronic Signature(s) Signed: 01/19/2019 6:04:26 PM By: Linton Ham MD Entered By: Linton Ham on 01/19/2019 16:07:47 -------------------------------------------------------------------------------- Physical Exam Details Patient Name: Date of Service: Nathaniel White, Nathaniel White 01/19/2019 2:30 PM Medical Record [id number] Patient Account Number: [id number] Date of Birth/Sex: Treating RN: Mar 12, 1960 (59 y.o. M) Primary Care Provider: Geryl Rankins Other Clinician: Referring Provider: Treating Provider/Extender:Vash Quezada, Rodrigo Ran, [name] in Treatment: 29 Constitutional Sitting or standing Blood Pressure is within target range for patient.. Pulse regular and within target range for patient.Marland Kitchen Respirations regular, non-labored and within target range.. Temperature is normal and within the target range for the patient.Marland Kitchen Appears in no distress. Notes Wound exam; left third amputation site remains closed although there is a lot of callus. Some of this is easily removed. The area on the left lateral heel has callus and debris around the wound circumference using a #  5 curette this was removed. Hemostasis with silver silver nitrate. Electronic Signature(s) Signed: 01/19/2019 6:04:26 PM By: Linton Ham MD Entered By: Linton Ham on 01/19/2019 16:08:34 -------------------------------------------------------------------------------- Physician Orders Details Patient Name: Date of Service: Nathaniel White, Nathaniel White 01/19/2019 2:30 PM Medical Record [id number] Patient Account Number: [id number] Date of Birth/Sex: Treating RN: [birth year] (59 y.o. [name] Primary Care Provider: Geryl Rankins Other Clinician: Referring Provider: Treating  Provider/Extender:Ilai Hiller, Rodrigo Ran, Paulette Blanch in Treatment: 29 Verbal / Phone Orders: No Diagnosis Coding ICD-10 Coding Code Description E11.621 Type 2 diabetes mellitus with foot ulcer E11.52 Type 2 diabetes mellitus with diabetic peripheral angiopathy with gangrene I87.333 Chronic venous hypertension (idiopathic) with ulcer and inflammation of bilateral lower extremity L97.423 Non-pressure chronic ulcer of left heel and midfoot with necrosis of muscle L97.221 Non-pressure chronic ulcer of left calf limited to breakdown of skin Follow-up Appointments Return Appointment in 2 weeks. Dressing Change Frequency Wound #1 Left,Lateral Calcaneus Change dressing three times week. Skin Barriers/Peri-Wound Care Moisturizing lotion - to both legs and feet Wound Cleansing Wound #1 Left,Lateral Calcaneus May shower and wash wound with soap and water. Primary Wound Dressing Wound #1 Left,Lateral Calcaneus Hydrofera Blue Secondary Dressing Wound #1 Left,Lateral Calcaneus Kerlix/Rolled Gauze Dry Gauze ABD pad Off-Loading Other: - float feet off of bed/chair by placing pillow under legs [facility] skilled [facility] for wound care. - [facility] Electronic Signature(s) Signed: 01/19/2019 6:04:26 PM By: Linton Ham MD Signed: 01/20/2019 6:18:44 PM By: Levan Hurst RN, BSN Entered By: Levan Hurst on 01/19/2019 15:39:47 -------------------------------------------------------------------------------- Problem List Details Patient Name: Date of Service: Nathaniel White, Nathaniel White 01/19/2019 2:30 PM Medical Record [id number] Patient Account Number: [id number] Date of Birth/Sex: Treating RN: [birth year] (59 y.o. [name] Primary Care Provider: Geryl Rankins Other Clinician: Referring Provider: Treating Provider/Extender:Brenlyn Beshara, Rodrigo Ran, Paulette Blanch in Treatment: 29 Active Problems ICD-10 Evaluated Encounter Code Description Active Date Today  Diagnosis E11.621 Type 2 diabetes mellitus with foot ulcer 06/30/2018 No Yes E11.52 Type 2 diabetes mellitus with diabetic peripheral 07/17/2018 No Yes angiopathy with gangrene I87.333 Chronic venous hypertension (idiopathic) with ulcer 06/30/2018 No Yes and inflammation of bilateral lower extremity L97.423 Non-pressure chronic ulcer of left heel and midfoot 07/29/2018 No Yes with necrosis of muscle L97.221 Non-pressure chronic ulcer of left calf limited to 07/29/2018 No Yes breakdown of skin Inactive Problems ICD-10 Code Description Active Date Inactive Date E11.51 Type 2 diabetes mellitus with diabetic peripheral angiopathy 06/30/2018 06/30/2018 without gangrene Resolved Problems Electronic Signature(s) Signed: 01/19/2019 6:04:26 PM By: Linton Ham MD Entered By: Linton Ham on 01/19/2019 16:06:27 -------------------------------------------------------------------------------- Progress Note Details Patient Name: Date of Service: Nathaniel White 01/19/2019 2:30 PM Medical Record [id number] Patient Account Number: [id number] Date of Birth/Sex: Treating RN: August 28, 1960 (59 y.o. M) Primary Care Provider: Geryl Rankins Other Clinician: Referring Provider: Treating Provider/Extender:Jayshun Galentine, Rodrigo Ran, Paulette Blanch in Treatment: 29 Subjective History of Present Illness (HPI) ADMISSION 06/30/2018 The patient is a 59 year old man with multiple medical issues including type 2 diabetes on dialysis. Currently the patient his dialysis is actually caused by nephrosclerosis and he has been on dialysis for 20 years. Roughly a month ago according to the patient he noticed a small open area on his left heel he showed it to his caregiver and according to her this rapidly expanded. Looking through [facility] health link he was in the ER with left foot pain on 4/3. An x-ray was negative noting only pedal vessel atherosclerosis. He was also seen on 4/22 at that point he was felt  to have a  cellulitis and abscess of the left foot he was given Silvadene. After her third visit to the ER and a visit to podiatry [Dr. Price] he was referred here. He has been using Silvadene. The patient thinks that he was bitten by something initially, but he did not actually see this The patient has a history of type 2 diabetes although he is not currently on anything for this and his hemoglobin A1c was 5; COPD, dilated cardiomyopathy, mitral regurgitation, CHF, remote CVA with left-sided hemiparesis, hypertension ABI in our clinic was not easy to obtain although we were able to get 0.93 on the left 6/29; I did a debridement on this last time however once again it he comes in with a mostly black eschar at surface. Very tender. We have not yet managed to get arterial studies on him. I will see if I can get them done through Dr. Kennon Holter office 7/9; his arterial studies were done on 7/1. On the right side his studies are not too bad with noncompressible ABIs at the PTA great TBI of 0.64 and monophasic waveforms on the left however an ABI could not be obtained at the ATA, at the PTA his ABI was 0.2 with a dampened monophasic waveforms there was no pressure in the great toe. He is developed a gangrenous-looking second toes since the last time I saw this patient. There is an ulcer on the medial aspect the second toe. The area on his heel is declining 7/21; since the patient was last here he was revascularized by Dr. Donzetta Matters on 07/21/2018. Noteworthy on the left that the SFA was approximately 50% stenosed at the takeoff but this was not treated. Popliteal artery had heavy areas of calcification and included in one segment and reconstituted just beyond this. This area was stented. After stenting there was 0% residual stenosis. It was noted he appeared to have three-vessel runoff via the tibials which are heavily calcified but without focal occlusions. The patient does not complain of pain. The area on the left  lateral heel is a deep necrotic wound it does not look any more viable this week. He has a gangrenous second toe which I do not think is going to be viable. Finally he has a new area on the left posterior calf 7/28; patient arrives with the mummified second toe. The large necrotic wound on the left lateral calcaneus is separating black eschar. He had a new area superiorly on the posterior calf. Home health could not get Iodoflex and he arrived with calcium alginate. He has been revascularized by Dr. Donzetta Matters patient is not aware when his follow-up appointment is. 8/4-Patient is back to the clinic with the mummified second toe on the left, left calcaneal ulcer with black eschar and visible tendon. His appointment with Dr. Gwenlyn Saran is on the 14th of this month. I have indicated to him that this is important to keep 8/11-8/11-Patient comes back for his left medial ankle wound, the more distal wound is bigger, has a lot of necrotic tissue, greenish drainage, eschar coating a portion of the wound 8/18; I believe the patient has had follow-up arterial studies ordered by Dr. Donzetta Matters but I do not think they have been done and the patient and his friend are not aware of an appointment time. He has a mummified left second toe and a small area on the left posterior calf in the mid aspect. This substantial area is on the left Achilles area extending into the left lateral heel.  This is a deep necrotic wound. It no longer has the black gangrenous surface. His friend says that there is still a malodorous discharge. I note that Dr. Evette Doffing did a culture of this area on 8/12. This grew Enterococcus faecalis and E. coli. He is started on Augmentin although the E. coli only has intermediate sensitivities here. He was referred to infectious disease. We could add either a first or third generation cephalosporin which should cover the E. coli although I did not do this today. 8/25; the patient has a mummified left second toe a  substantial wound over the left Achilles area with exposed tendon. And a small area on the left posterior calf. He has been revascularized by Dr. Donzetta Matters in July. He has not made it for his follow-up noninvasive studies. X-ray I did of the left heel last week showed soft tissue gas along the posterior aspect of the heel and soft tissue defect and findings suspicious for osteomyelitis involving the distal aspect of the second proximal phalanx. This is the mummified toe. The patient went to see Dr. Linus Salmons of infectious disease. I am not able to really follow the flow of his note but according to when I am seeing he did not add or suggest any additional antibiotics unless he called these into dialysis. 9/1; I was able to review Dr. Henreitta Leber notes he did not feel any further antibiotics were indicated. I still do not see where the patient had his postprocedure noninvasive studies. We have been using silver collagen to the substantial area on the left Achilles. This has exposed tendon. He has a mummified left second toe. I am doubtful there is any viable tissue here. The patient is nonambulatory. He has flexion contractures of his left hip and knee. 10/1; the patient has not been here in a month I am really not sure why. He was seen by vein and vascular on 9/25 he had his noninvasive studies done. His monophasic waveforms on the left were noted. He had Dopplers across the stent which was felt to be patent. The patient was seen by Dagoberto Ligas PAC. The arterial duplex today demonstrated a patent popliteal stent with improved TBI. Apparently his caregiver demanded an amputation but this was not felt to be necessary. He is on aspirin and Plavix. Dressings are being changed by home health he was using Prisma. 10/9; patient sees the vascular next week. We will call urgently this morning by home health report that the mom applied left second toe had "almost fallen off". Nevertheless the patient arrived in my  clinic today. He has a lateral left heel wound. 10/16; substantial wound on the left lateral heel extending into the Achilles area. Also a mummified area on the left second toe. I contacted Dr. Donzetta Matters about that earlier in the week. He agrees that the left second toe needs an amputation. Apparently noninvasive Doppler tests in Dr. Claretha Cooper office showed good blood flow according to the patient although I did not see this in the note 11/9; substantial wound on the left lateral heel extending to the Achilles area. The left second toe was amputated by Dr. Donzetta Matters on 10/28. I did not review this area. 12/4; patient has not been here in a month. He has home health. The left second toe amputation site has closed over. The area on the lateral part of his heel extending into the Achilles is a lot better. Thick surrounding material around the wound edge will inhibit final healing however. 12/28; patient has not  been here in a month. He has home health. We are using Hydrofera Blue to the area on the left heel area. He has healed over the left second toe amputation site. 1/11; 2-week follow-up. The patient is doing well his wound is down to a very small area. Copious amounts of callus and debris around the margins though. We have been using Hydrofera Blue. He remains healed over the left second toe amputation site Objective Constitutional Sitting or standing Blood Pressure is within target range for patient.. Pulse regular and within target range for patient.Marland Kitchen Respirations regular, non-labored and within target range.. Temperature is normal and within the target range for the patient.Marland Kitchen Appears in no distress. Vitals Time Taken: 2:30 PM, Height: 69 in, Weight: 170 lbs, BMI: 25.1, Temperature: 97.5 F, Pulse: 101 bpm, Respiratory Rate: 18 breaths/min, Blood Pressure: 131/81 mmHg. General Notes: Wound exam; left third amputation site remains closed although there is a lot of callus. Some of this is easily  removed. ooThe area on the left lateral heel has callus and debris around the wound circumference using a #5 curette this was removed. Hemostasis with silver silver nitrate. Integumentary (Hair, Skin) Wound #1 status is Open. Original cause of wound was Gradually Appeared. The wound is located on the Left,Lateral Calcaneus. The wound measures 2.5cm length x 0.6cm width x 0.1cm depth; 1.178cm^2 area and 0.118cm^3 volume. There is Fat Layer (Subcutaneous Tissue) Exposed exposed. There is no tunneling or undermining noted. There is a medium amount of serosanguineous drainage noted. The wound margin is well defined and not attached to the wound base. There is large (67-100%) red, pink granulation within the wound bed. There is no necrotic tissue within the wound bed. 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 There was a Excisional Skin/Subcutaneous Tissue Debridement with a total area of 1.5 sq cm performed by Ricard Dillon., MD. With the following instrument(s): Blade, and Forceps to remove Viable and Non-Viable tissue/material. Material removed includes Callus, Subcutaneous Tissue, and Skin: Epidermis. No specimens were taken. A time out was conducted at 15:35, prior to the start of the procedure. A Moderate amount of bleeding was controlled with Silver Nitrate. The procedure was tolerated well with a pain level of 0 throughout and a pain level  of 0 following the procedure. Post Debridement Measurements: 2.5cm length x 0.6cm width x 0.1cm depth; 0.118cm^3 volume. Character of Wound/Ulcer Post Debridement is improved. Severity of Tissue Post Debridement is: Fat layer exposed. Post procedure Diagnosis Wound #1: Same as Pre-Procedure Plan Follow-up Appointments: Return Appointment in 2 weeks. Dressing Change Frequency: Wound #1 Left,Lateral Calcaneus: Change dressing three times week. Skin Barriers/Peri-Wound Care: Moisturizing lotion - to both legs and feet Wound Cleansing: Wound #1 Left,Lateral Calcaneus: May shower and wash wound with soap and water. Primary Wound Dressing: Wound #1 Left,Lateral Calcaneus: Hydrofera Blue Secondary Dressing: Wound #1 Left,Lateral Calcaneus: Kerlix/Rolled Gauze Dry Gauze ABD pad Off-Loading: Other: - float feet off of bed/chair by placing pillow under legs [facility]: [facility]. - [facility]  1. Continue with Hydrofera Blue 2. Should be healed by the next time he is here. 3. We asked him to lotion his feet the skin is exceptionally dry Electronic Signature(s) Signed: 01/19/2019 6:04:26 PM By: Linton Ham MD Entered By: Linton Ham on 01/19/2019 16:09:13 -------------------------------------------------------------------------------- SuperBill Details Patient Name: Date of Service: Nathaniel White 01/19/2019 Medical Record [id number] Patient Account Number: [id number] Date of Birth/Sex: Treating RN: 15-Nov-1960 (59 y.o. 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 L97.221 Non-pressure chronic ulcer of left calf limited to breakdown of skin Facility Procedures CPT4 Code Description: 48628241 11042 - DEB SUBQ TISSUE 20 SQ CM/< ICD-10 Diagnosis Description L97.423 Non-pressure chronic ulcer of left heel and midfoot with Modifier: necrosis of mus Quantity: 1 cle Physician Procedures CPT4 Code Description: 7530104 04591 - WC PHYS SUBQ TISS 20 SQ CM ICD-10 Diagnosis Description L97.423 Non-pressure chronic ulcer of left heel and midfoot with Modifier: necrosis of musc Quantity: 1 le Electronic Signature(s) Signed: 01/19/2019 6:04:26 PM By: Linton Ham MD Entered By: Linton Ham on 01/19/2019 16:09:36

## 2019-01-21 ENCOUNTER — Other Ambulatory Visit: Payer: Self-pay

## 2019-01-21 ENCOUNTER — Ambulatory Visit (HOSPITAL_COMMUNITY): Payer: Medicare Other | Attending: Cardiology

## 2019-01-21 DIAGNOSIS — I340 Nonrheumatic mitral (valve) insufficiency: Secondary | ICD-10-CM | POA: Insufficient documentation

## 2019-01-21 DIAGNOSIS — L97422 Non-pressure chronic ulcer of left heel and midfoot with fat layer exposed: Secondary | ICD-10-CM | POA: Diagnosis not present

## 2019-01-21 DIAGNOSIS — L97522 Non-pressure chronic ulcer of other part of left foot with fat layer exposed: Secondary | ICD-10-CM | POA: Diagnosis not present

## 2019-01-21 DIAGNOSIS — E1122 Type 2 diabetes mellitus with diabetic chronic kidney disease: Secondary | ICD-10-CM | POA: Diagnosis not present

## 2019-01-21 DIAGNOSIS — Z4800 Encounter for change or removal of nonsurgical wound dressing: Secondary | ICD-10-CM | POA: Diagnosis not present

## 2019-01-21 DIAGNOSIS — I428 Other cardiomyopathies: Secondary | ICD-10-CM | POA: Diagnosis not present

## 2019-01-21 DIAGNOSIS — E11621 Type 2 diabetes mellitus with foot ulcer: Secondary | ICD-10-CM | POA: Diagnosis not present

## 2019-01-21 DIAGNOSIS — E1151 Type 2 diabetes mellitus with diabetic peripheral angiopathy without gangrene: Secondary | ICD-10-CM | POA: Diagnosis not present

## 2019-01-22 ENCOUNTER — Telehealth: Payer: Self-pay

## 2019-01-22 DIAGNOSIS — N2581 Secondary hyperparathyroidism of renal origin: Secondary | ICD-10-CM | POA: Diagnosis not present

## 2019-01-22 DIAGNOSIS — L97522 Non-pressure chronic ulcer of other part of left foot with fat layer exposed: Secondary | ICD-10-CM | POA: Diagnosis not present

## 2019-01-22 DIAGNOSIS — E119 Type 2 diabetes mellitus without complications: Secondary | ICD-10-CM | POA: Diagnosis not present

## 2019-01-22 DIAGNOSIS — Z992 Dependence on renal dialysis: Secondary | ICD-10-CM | POA: Diagnosis not present

## 2019-01-22 DIAGNOSIS — Z4800 Encounter for change or removal of nonsurgical wound dressing: Secondary | ICD-10-CM | POA: Diagnosis not present

## 2019-01-22 DIAGNOSIS — E11621 Type 2 diabetes mellitus with foot ulcer: Secondary | ICD-10-CM | POA: Diagnosis not present

## 2019-01-22 DIAGNOSIS — E1151 Type 2 diabetes mellitus with diabetic peripheral angiopathy without gangrene: Secondary | ICD-10-CM | POA: Diagnosis not present

## 2019-01-22 DIAGNOSIS — N186 End stage renal disease: Secondary | ICD-10-CM | POA: Diagnosis not present

## 2019-01-22 DIAGNOSIS — E1122 Type 2 diabetes mellitus with diabetic chronic kidney disease: Secondary | ICD-10-CM | POA: Diagnosis not present

## 2019-01-22 DIAGNOSIS — L97422 Non-pressure chronic ulcer of left heel and midfoot with fat layer exposed: Secondary | ICD-10-CM | POA: Diagnosis not present

## 2019-01-22 NOTE — Telephone Encounter (Signed)
Attempted to call pt with results to recent echo but no answer and unable to leave message as voicemail box has not been set up yet.

## 2019-01-22 NOTE — Telephone Encounter (Signed)
-----   Message from Burnell Blanks, MD sent at 01/21/2019  4:51 PM EST ----- His heart muscle is stronger than on last check. LVEF now 40-45%. Had been 30%. Mild valve disease. Good news. cdm

## 2019-01-23 DIAGNOSIS — L97422 Non-pressure chronic ulcer of left heel and midfoot with fat layer exposed: Secondary | ICD-10-CM | POA: Diagnosis not present

## 2019-01-23 DIAGNOSIS — Z4800 Encounter for change or removal of nonsurgical wound dressing: Secondary | ICD-10-CM | POA: Diagnosis not present

## 2019-01-23 DIAGNOSIS — L97522 Non-pressure chronic ulcer of other part of left foot with fat layer exposed: Secondary | ICD-10-CM | POA: Diagnosis not present

## 2019-01-23 DIAGNOSIS — E11621 Type 2 diabetes mellitus with foot ulcer: Secondary | ICD-10-CM | POA: Diagnosis not present

## 2019-01-23 DIAGNOSIS — E1122 Type 2 diabetes mellitus with diabetic chronic kidney disease: Secondary | ICD-10-CM | POA: Diagnosis not present

## 2019-01-23 DIAGNOSIS — E1151 Type 2 diabetes mellitus with diabetic peripheral angiopathy without gangrene: Secondary | ICD-10-CM | POA: Diagnosis not present

## 2019-01-23 NOTE — Progress Notes (Signed)
SERIGNE, KUBICEK ([id number]) Visit Report for 01/19/2019 Arrival Information Details Patient Name: Date of Service: Nathaniel White, Nathaniel White 01/19/2019 2:30 PM Medical Record [id number] Patient Account Number: [id number] Date of Birth/Sex: Treating RN: May 26, 1960 (59 y.o. [name] Primary Care Sephira Zellman: Geryl Rankins Other Clinician: Referring Apryl Brymer: Treating Deshunda Thackston/Extender:Robson, Rodrigo Ran, Paulette Blanch in Treatment: 29 Visit Information History Since Last Visit Added or deleted any medications: No Patient Arrived: Wheel Chair Any new allergies or adverse reactions: No Arrival Time: 14:38 Had a fall or experienced change in No activities of daily living that may affect Accompanied By: caregiver risk of falls: Transfer Assistance: None Signs or symptoms of abuse/neglect since last No Patient Identification Verified: Yes visito Secondary Verification Process Completed: Yes Hospitalized since last visit: No Patient Requires Transmission-Based No Implantable device outside of the clinic excluding No Precautions: cellular tissue based products placed in the center Patient Has Alerts: No since last visit: Has Dressing in Place as Prescribed: Yes Pain Present Now: No Electronic Signature(s) Signed: 01/19/2019 5:57:16 PM By: Kela Millin Entered By: Kela Millin on 01/19/2019 14:39:13 -------------------------------------------------------------------------------- Encounter Discharge Information Details Patient Name: Date of Service: Nathaniel White 01/19/2019 2:30 PM Medical Record [id number] Patient Account Number: [id number] Date of Birth/Sex: Treating RN: [birth year] (59 y.o. [name] Primary Care Natahlia Hoggard: Geryl Rankins Other Clinician: Referring Tonetta Napoles: Treating Graclynn Vanantwerp/Extender:Robson, Rodrigo Ran, Paulette Blanch in Treatment: 29 Encounter Discharge Information Items Post Procedure Vitals Discharge Condition:  Stable Temperature (F): 97.5 Ambulatory Status: Wheelchair Pulse (bpm): 101 Discharge Destination: Home Respiratory Rate (breaths/min): 18 Transportation: Private Auto Blood Pressure (mmHg): 131/81 Accompanied By: caregiver Schedule Follow-up Appointment: Yes Clinical Summary of Care: Patient Declined Electronic Signature(s) Signed: 01/19/2019 5:57:16 PM By: Kela Millin Entered By: Kela Millin on 01/19/2019 16:28:30 -------------------------------------------------------------------------------- Lower Extremity Assessment Details Patient Name: Date of Service: Nathaniel White, Nathaniel White 01/19/2019 2:30 PM Medical Record [id number] Patient Account Number: [id number] Date of Birth/Sex: Treating RN: January 29, 1960 (59 y.o. [name] Primary Care Francetta Ilg: Geryl Rankins Other Clinician: Referring Deshae Dickison: Treating Elleah Hemsley/Extender:Robson, Rodrigo Ran, [name] in Treatment: 29 Edema Assessment Assessed: [Left: No] [Right: No] Edema: [Left: Ye] [Right: s] Calf Left: Right: Point of Measurement: 40 cm From Medial Instep 34 cm cm Ankle Left: Right: Point of Measurement: 10 cm From Medial Instep 25.2 cm cm Vascular Assessment Pulses: Dorsalis Pedis Palpable: [Left:Yes] Electronic Signature(s) Signed: 01/19/2019 5:57:16 PM By: Kela Millin Entered By: Kela Millin on 01/19/2019 14:42:16 -------------------------------------------------------------------------------- Multi Wound Chart Details Patient Name: Date of Service: Nathaniel White 01/19/2019 2:30 PM Medical Record [id number] Patient Account Number: [id number] Date of Birth/Sex: Treating RN: 12/22/1960 (59 y.o. M) Primary Care Ahmaud Duthie: Geryl Rankins Other Clinician: Referring Burlie Cajamarca: Treating Glender Augusta/Extender:Robson, Rodrigo Ran, [name] in Treatment: 29 Vital Signs Height(in): 69 Pulse(bpm): 101 Weight(lbs): 170 Blood Pressure(mmHg): 131/81 Body Mass  Index(BMI): 25 Temperature(F): 97.5 Respiratory 18 Rate(breaths/min): Photos: [1:No Photos] [N/A:N/A] Wound Location: [1:Left Calcaneus - Lateral] [N/A:N/A] Wounding Event: [1:Gradually Appeared] [N/A:N/A] Primary Etiology: [1:Diabetic Wound/Ulcer of the N/A Lower Extremity] Comorbid History: [1:Chronic Obstructive Pulmonary Disease (COPD), Congestive Heart Failure, Hypertension, Type II Diabetes, End Stage Renal Disease] [N/A:N/A] Date Acquired: [1:06/02/2018] [N/A:N/A] Weeks of Treatment: [1:29] [N/A:N/A] Wound Status: [1:Open] [N/A:N/A] Measurements L x W x D 2.5x0.6x0.1 [N/A:N/A] (cm) Area (cm) : [1:1.178] [N/A:N/A] Volume (cm) : [1:0.118] [N/A:N/A] % Reduction in Area: [1:96.30%] [N/A:N/A] % Reduction in Volume: 98.70% [N/A:N/A] Classification: [1:Grade 4] [N/A:N/A] Exudate Amount: [1:Medium] [N/A:N/A] Exudate Type: [1:Serosanguineous] [N/A:N/A] Exudate Color: [1:red, brown] [N/A:N/A] Wound Margin: [1:Well defined, not attached N/A] Granulation  Amount: [1:Large (67-100%)] [N/A:N/A] Granulation Quality: [1:Red, Pink] [N/A:N/A] Necrotic Amount: [1:None Present (0%)] [N/A:N/A] Exposed Structures: [1:Fat Layer (Subcutaneous N/A Tissue) Exposed: Yes Fascia: No Tendon: No Muscle: No Joint: No Bone: No] Epithelialization: [1:Medium (34-66%)] [N/A:N/A] Debridement: [1:Debridement - Excisional N/A] Pre-procedure [1:15:35] [N/A:N/A] Verification/Time Out Taken: Tissue Debrided: [1:Callus, Subcutaneous] [N/A:N/A] Level: [1:Skin/Subcutaneous Tissue N/A] Debridement Area (sq cm):1.5 [N/A:N/A] Instrument: [1:Blade, Forceps] [N/A:N/A] Bleeding: [1:Moderate] [N/A:N/A] Hemostasis Achieved: [1:Silver Nitrate] [N/A:N/A] Procedural Pain: [1:0] [N/A:N/A] Post Procedural Pain: [1:0] [N/A:N/A] Debridement Treatment [1:Procedure was tolerated] [N/A:N/A] Response: [1:well] Post Debridement [1:2.5x0.6x0.1] [N/A:N/A] Measurements L x W x D (cm) Post Debridement [1:0.118]  [N/A:N/A] Volume: (cm) Procedures Performed: [1:Debridement] [N/A:N/A] Treatment Notes Electronic Signature(s) Signed: 01/19/2019 6:04:26 PM By: Linton Ham MD Entered By: Linton Ham on 01/19/2019 16:06:36 -------------------------------------------------------------------------------- Multi-Disciplinary Care Plan Details Patient Name: Date of Service: Nathaniel White. 01/19/2019 2:30 PM Medical Record [id number] Patient Account Number: [id number] Date of Birth/Sex: Treating RN: 1960/04/02 (59 y.o. [name] Primary Care Stevenson Windmiller: Geryl Rankins Other Clinician: Referring Benoit Meech: Treating Deborra Phegley/Extender:Robson, Rodrigo Ran, Paulette Blanch in Treatment: 29 Active Inactive Wound/Skin Impairment Nursing Diagnoses: Impaired tissue integrity Knowledge deficit related to ulceration/compromised skin integrity Goals: Patient/caregiver will verbalize understanding of skin care regimen Date Initiated: 06/30/2018 Target Resolution Date: 02/06/2019 Goal Status: Active Ulcer/skin breakdown will have a volume reduction of 30% by week 4 Date Initiated: 06/30/2018 Date Inactivated: 08/05/2018 Target Resolution Date: 08/01/2018 Goal Status: Unmet Unmet Reason: comorbities Ulcer/skin breakdown will have a volume reduction of 50% by week 8 Date Initiated: 08/05/2018 Date Inactivated: 09/09/2018 Target Resolution Date: 09/05/2018 Goal Status: Unmet Unmet Reason: comorbities Ulcer/skin breakdown will have a volume reduction of 80% by week 12 Date Initiated: 09/09/2018 Date Inactivated: 10/17/2018 Target Resolution Date: 10/03/2018 Goal Status: Unmet Unmet Reason: PAD Interventions: Assess patient/caregiver ability to obtain necessary supplies Assess patient/caregiver ability to perform ulcer/skin care regimen upon admission and as needed Assess ulceration(s) every visit Provide education on ulcer and skin care Notes: Electronic Signature(s) Signed: 01/20/2019 6:18:44 PM  By: Levan Hurst RN, BSN Entered By: Levan Hurst on 01/19/2019 15:40:09 -------------------------------------------------------------------------------- Pain Assessment Details Patient Name: Date of Service: Nathaniel White 01/19/2019 2:30 PM Medical Record [id number] Patient Account Number: [id number] Date of Birth/Sex: Treating RN: 1960-03-25 (59 y.o. [name] Primary Care Adley Castello: Geryl Rankins Other Clinician: Referring Olaf Mesa: Treating Carisha Kantor/Extender:Robson, Rodrigo Ran, Paulette Blanch in Treatment: 29 Active Problems Location of Pain Severity and Description of Pain Patient Has Paino No Site Locations Pain Management and Medication Current Pain Management: Electronic Signature(s) Signed: 01/19/2019 5:57:16 PM By: Kela Millin Entered By: Kela Millin on 01/19/2019 14:41:47 -------------------------------------------------------------------------------- Patient/Caregiver Education Details Patient Name: Date of Service: Nathaniel White 1/11/2021andnbsp2:30 PM Medical Record [phone] Patient Account Number: [id number] Date of Birth/Gender: Treating RN: 1960-12-20 (59 y.o. [name] Primary Care Physician: Geryl Rankins Other Clinician: Referring Physician: Treating Physician/Extender:Robson, Rodrigo Ran, Paulette Blanch in Treatment: 29 Education Assessment Education Provided To: Patient Education Topics Provided Wound/Skin Impairment: Methods: Explain/Verbal Responses: State content correctly Electronic Signature(s) Signed: 01/20/2019 6:18:44 PM By: Levan Hurst RN, BSN Entered By: Levan Hurst on 01/19/2019 15:40:21 -------------------------------------------------------------------------------- Wound Assessment Details Patient Name: Date of Service: Nathaniel White 01/19/2019 2:30 PM Medical Record [id number] Patient Account Number: [id number] Date of Birth/Sex: Treating RN: 12-21-1960 (59  y.o. [name] Primary Care Krystin Keeven: Geryl Rankins Other Clinician: Referring Tami Blass: Treating Tita Terhaar/Extender:Robson, Rodrigo Ran, [name] in Treatment: 29 Wound Status Wound Number: 1 Primary Diabetic Wound/Ulcer of the Lower Extremity Etiology: Wound Location: Left Calcaneus -  Lateral Wound Open Wounding Event: Gradually Appeared Status: Date Acquired: 06/02/2018 Comorbid Chronic Obstructive Pulmonary Disease Weeks Of Treatment: 29 History: (COPD), Congestive Heart Failure, Clustered Wound: No Hypertension, Type II Diabetes, End Stage Renal Disease Photos Wound Measurements Length: (cm) 2.5 % Reductio Width: (cm) 0.6 % Reductio Depth: (cm) 0.1 Epithelial Area: (cm) 1.178 Tunneling Volume: (cm) 0.118 Undermini Wound Description Classification: Grade 4 Wound Margin: Well defined, not attached Exudate Amount: Medium Exudate Type: Serosanguineous Exudate Color: red, brown Wound Bed Granulation Amount: Large (67-100%) Granulation Quality: Red, Pink Necrotic Amount: None Present (0%) Foul Odor After Cleansing: No Slough/Fibrino No Exposed Structure Fascia Exposed: No Fat Layer (Subcutaneous Tissue) Exposed: Yes Tendon Exposed: No Muscle Exposed: No Joint Exposed: No Bone Exposed: No n in Area: 96.3% n in Volume: 98.7% ization: Medium (34-66%) : No ng: No Treatment Notes Wound #1 (Left, Lateral Calcaneus) 1. Cleanse With Wound Cleanser 2. Periwound Care Moisturizing lotion 3. Primary Dressing Applied Hydrofera Blue 4. Secondary Dressing ABD Pad Roll Gauze 5. Secured With Tape Notes netting. Electronic Signature(s) Signed: 01/20/2019 3:22:35 PM By: Mikeal Hawthorne EMT/HBOT Signed: 01/23/2019 5:57:08 PM By: Kela Millin Previous Signature: 01/19/2019 5:57:16 PM Version By: Kela Millin Entered By: Mikeal Hawthorne on 01/20/2019  14:27:31 -------------------------------------------------------------------------------- Vitals Details Patient Name: Date of Service: Nathaniel White 01/19/2019 2:30 PM Medical Record [id number] Patient Account Number: [id number] Date of Birth/Sex: Treating RN: 02-28-1960 (59 y.o. [name] Primary Care Azriel Jakob: Geryl Rankins Other Clinician: Referring Midori Dado: Treating Damita Eppard/Extender:Robson, Rodrigo Ran, [name] in Treatment: 29 Vital Signs Time Taken: 14:30 Temperature (F): 97.5 Height (in): 69 Pulse (bpm): 101 Weight (lbs): 170 Respiratory Rate (breaths/min): 18 Body Mass Index (BMI): 25.1 Blood Pressure (mmHg): 131/81 Reference Range: 80 - 120 mg / dl Electronic Signature(s) Signed: 01/19/2019 5:57:16 PM By: Kela Millin Entered By: Kela Millin on 01/19/2019 14:39:37

## 2019-01-24 DIAGNOSIS — E119 Type 2 diabetes mellitus without complications: Secondary | ICD-10-CM | POA: Diagnosis not present

## 2019-01-24 DIAGNOSIS — N2581 Secondary hyperparathyroidism of renal origin: Secondary | ICD-10-CM | POA: Diagnosis not present

## 2019-01-24 DIAGNOSIS — Z992 Dependence on renal dialysis: Secondary | ICD-10-CM | POA: Diagnosis not present

## 2019-01-24 DIAGNOSIS — N186 End stage renal disease: Secondary | ICD-10-CM | POA: Diagnosis not present

## 2019-01-26 DIAGNOSIS — E11621 Type 2 diabetes mellitus with foot ulcer: Secondary | ICD-10-CM | POA: Diagnosis not present

## 2019-01-26 DIAGNOSIS — L97422 Non-pressure chronic ulcer of left heel and midfoot with fat layer exposed: Secondary | ICD-10-CM | POA: Diagnosis not present

## 2019-01-26 DIAGNOSIS — E1122 Type 2 diabetes mellitus with diabetic chronic kidney disease: Secondary | ICD-10-CM | POA: Diagnosis not present

## 2019-01-26 DIAGNOSIS — L97522 Non-pressure chronic ulcer of other part of left foot with fat layer exposed: Secondary | ICD-10-CM | POA: Diagnosis not present

## 2019-01-26 DIAGNOSIS — Z4800 Encounter for change or removal of nonsurgical wound dressing: Secondary | ICD-10-CM | POA: Diagnosis not present

## 2019-01-26 DIAGNOSIS — E1151 Type 2 diabetes mellitus with diabetic peripheral angiopathy without gangrene: Secondary | ICD-10-CM | POA: Diagnosis not present

## 2019-01-27 DIAGNOSIS — E1151 Type 2 diabetes mellitus with diabetic peripheral angiopathy without gangrene: Secondary | ICD-10-CM | POA: Diagnosis not present

## 2019-01-27 DIAGNOSIS — Z4800 Encounter for change or removal of nonsurgical wound dressing: Secondary | ICD-10-CM | POA: Diagnosis not present

## 2019-01-27 DIAGNOSIS — Z992 Dependence on renal dialysis: Secondary | ICD-10-CM | POA: Diagnosis not present

## 2019-01-27 DIAGNOSIS — E119 Type 2 diabetes mellitus without complications: Secondary | ICD-10-CM | POA: Diagnosis not present

## 2019-01-27 DIAGNOSIS — E11621 Type 2 diabetes mellitus with foot ulcer: Secondary | ICD-10-CM | POA: Diagnosis not present

## 2019-01-27 DIAGNOSIS — L97522 Non-pressure chronic ulcer of other part of left foot with fat layer exposed: Secondary | ICD-10-CM | POA: Diagnosis not present

## 2019-01-27 DIAGNOSIS — L97422 Non-pressure chronic ulcer of left heel and midfoot with fat layer exposed: Secondary | ICD-10-CM | POA: Diagnosis not present

## 2019-01-27 DIAGNOSIS — N2581 Secondary hyperparathyroidism of renal origin: Secondary | ICD-10-CM | POA: Diagnosis not present

## 2019-01-27 DIAGNOSIS — N186 End stage renal disease: Secondary | ICD-10-CM | POA: Diagnosis not present

## 2019-01-27 DIAGNOSIS — E1122 Type 2 diabetes mellitus with diabetic chronic kidney disease: Secondary | ICD-10-CM | POA: Diagnosis not present

## 2019-01-28 DIAGNOSIS — I509 Heart failure, unspecified: Secondary | ICD-10-CM | POA: Diagnosis not present

## 2019-01-28 DIAGNOSIS — J449 Chronic obstructive pulmonary disease, unspecified: Secondary | ICD-10-CM | POA: Diagnosis not present

## 2019-01-28 DIAGNOSIS — Z87891 Personal history of nicotine dependence: Secondary | ICD-10-CM | POA: Diagnosis not present

## 2019-01-28 DIAGNOSIS — E11621 Type 2 diabetes mellitus with foot ulcer: Secondary | ICD-10-CM | POA: Diagnosis not present

## 2019-01-28 DIAGNOSIS — I420 Dilated cardiomyopathy: Secondary | ICD-10-CM | POA: Diagnosis not present

## 2019-01-28 DIAGNOSIS — E1122 Type 2 diabetes mellitus with diabetic chronic kidney disease: Secondary | ICD-10-CM | POA: Diagnosis not present

## 2019-01-28 DIAGNOSIS — E1151 Type 2 diabetes mellitus with diabetic peripheral angiopathy without gangrene: Secondary | ICD-10-CM | POA: Diagnosis not present

## 2019-01-28 DIAGNOSIS — Z9981 Dependence on supplemental oxygen: Secondary | ICD-10-CM | POA: Diagnosis not present

## 2019-01-28 DIAGNOSIS — I69354 Hemiplegia and hemiparesis following cerebral infarction affecting left non-dominant side: Secondary | ICD-10-CM | POA: Diagnosis not present

## 2019-01-28 DIAGNOSIS — I132 Hypertensive heart and chronic kidney disease with heart failure and with stage 5 chronic kidney disease, or end stage renal disease: Secondary | ICD-10-CM | POA: Diagnosis not present

## 2019-01-28 DIAGNOSIS — L97422 Non-pressure chronic ulcer of left heel and midfoot with fat layer exposed: Secondary | ICD-10-CM | POA: Diagnosis not present

## 2019-01-28 DIAGNOSIS — Z4800 Encounter for change or removal of nonsurgical wound dressing: Secondary | ICD-10-CM | POA: Diagnosis not present

## 2019-01-28 DIAGNOSIS — L97522 Non-pressure chronic ulcer of other part of left foot with fat layer exposed: Secondary | ICD-10-CM | POA: Diagnosis not present

## 2019-01-28 DIAGNOSIS — N186 End stage renal disease: Secondary | ICD-10-CM | POA: Diagnosis not present

## 2019-01-28 DIAGNOSIS — Z992 Dependence on renal dialysis: Secondary | ICD-10-CM | POA: Diagnosis not present

## 2019-01-29 DIAGNOSIS — N2581 Secondary hyperparathyroidism of renal origin: Secondary | ICD-10-CM | POA: Diagnosis not present

## 2019-01-29 DIAGNOSIS — N186 End stage renal disease: Secondary | ICD-10-CM | POA: Diagnosis not present

## 2019-01-29 DIAGNOSIS — Z992 Dependence on renal dialysis: Secondary | ICD-10-CM | POA: Diagnosis not present

## 2019-01-29 DIAGNOSIS — E119 Type 2 diabetes mellitus without complications: Secondary | ICD-10-CM | POA: Diagnosis not present

## 2019-01-30 DIAGNOSIS — L97522 Non-pressure chronic ulcer of other part of left foot with fat layer exposed: Secondary | ICD-10-CM | POA: Diagnosis not present

## 2019-01-30 DIAGNOSIS — Z4800 Encounter for change or removal of nonsurgical wound dressing: Secondary | ICD-10-CM | POA: Diagnosis not present

## 2019-01-30 DIAGNOSIS — L97422 Non-pressure chronic ulcer of left heel and midfoot with fat layer exposed: Secondary | ICD-10-CM | POA: Diagnosis not present

## 2019-01-30 DIAGNOSIS — E1122 Type 2 diabetes mellitus with diabetic chronic kidney disease: Secondary | ICD-10-CM | POA: Diagnosis not present

## 2019-01-30 DIAGNOSIS — E1151 Type 2 diabetes mellitus with diabetic peripheral angiopathy without gangrene: Secondary | ICD-10-CM | POA: Diagnosis not present

## 2019-01-30 DIAGNOSIS — E11621 Type 2 diabetes mellitus with foot ulcer: Secondary | ICD-10-CM | POA: Diagnosis not present

## 2019-01-31 DIAGNOSIS — N2581 Secondary hyperparathyroidism of renal origin: Secondary | ICD-10-CM | POA: Diagnosis not present

## 2019-01-31 DIAGNOSIS — E119 Type 2 diabetes mellitus without complications: Secondary | ICD-10-CM | POA: Diagnosis not present

## 2019-01-31 DIAGNOSIS — Z992 Dependence on renal dialysis: Secondary | ICD-10-CM | POA: Diagnosis not present

## 2019-01-31 DIAGNOSIS — N186 End stage renal disease: Secondary | ICD-10-CM | POA: Diagnosis not present

## 2019-02-02 ENCOUNTER — Encounter (HOSPITAL_BASED_OUTPATIENT_CLINIC_OR_DEPARTMENT_OTHER): Payer: Medicare Other | Admitting: Internal Medicine

## 2019-02-02 ENCOUNTER — Other Ambulatory Visit: Payer: Self-pay

## 2019-02-02 DIAGNOSIS — Z992 Dependence on renal dialysis: Secondary | ICD-10-CM | POA: Diagnosis not present

## 2019-02-02 DIAGNOSIS — L97423 Non-pressure chronic ulcer of left heel and midfoot with necrosis of muscle: Secondary | ICD-10-CM | POA: Diagnosis not present

## 2019-02-02 DIAGNOSIS — E11621 Type 2 diabetes mellitus with foot ulcer: Secondary | ICD-10-CM | POA: Diagnosis not present

## 2019-02-02 DIAGNOSIS — I87333 Chronic venous hypertension (idiopathic) with ulcer and inflammation of bilateral lower extremity: Secondary | ICD-10-CM | POA: Diagnosis not present

## 2019-02-02 DIAGNOSIS — E1152 Type 2 diabetes mellitus with diabetic peripheral angiopathy with gangrene: Secondary | ICD-10-CM | POA: Diagnosis not present

## 2019-02-02 DIAGNOSIS — S91302A Unspecified open wound, left foot, initial encounter: Secondary | ICD-10-CM | POA: Diagnosis not present

## 2019-02-02 DIAGNOSIS — L84 Corns and callosities: Secondary | ICD-10-CM | POA: Diagnosis not present

## 2019-02-02 DIAGNOSIS — L97221 Non-pressure chronic ulcer of left calf limited to breakdown of skin: Secondary | ICD-10-CM | POA: Diagnosis not present

## 2019-02-03 DIAGNOSIS — E119 Type 2 diabetes mellitus without complications: Secondary | ICD-10-CM | POA: Diagnosis not present

## 2019-02-03 DIAGNOSIS — N2581 Secondary hyperparathyroidism of renal origin: Secondary | ICD-10-CM | POA: Diagnosis not present

## 2019-02-03 DIAGNOSIS — N186 End stage renal disease: Secondary | ICD-10-CM | POA: Diagnosis not present

## 2019-02-03 DIAGNOSIS — Z992 Dependence on renal dialysis: Secondary | ICD-10-CM | POA: Diagnosis not present

## 2019-02-03 NOTE — Progress Notes (Signed)
CORDNEY, BARSTOW ([id number]) Visit Report for 02/02/2019 HPI Details Patient Name: Date of Service: Nathaniel White, Nathaniel White 02/02/2019 2:30 PM Medical Record [id number] Patient Account Number: [id number] Date of Birth/Sex: Treating RN: January 14, 1960 (59 y.o. M) Primary Care Provider: Geryl Rankins Other Clinician: Referring Provider: Treating Provider/Extender:Nelson Julson, Rodrigo Ran, Paulette Blanch in Treatment: 31 History of Present Illness HPI Description: ADMISSION 06/30/2018 The patient is a 59 year old man with multiple medical issues including type 2 diabetes on dialysis. Currently the patient his dialysis is actually caused by nephrosclerosis and he has been on dialysis for 20 years. Roughly a month ago according to the patient he noticed a small open area on his left heel he showed it to his caregiver and according to her this rapidly expanded. Looking through [facility] health link he was in the ER with left foot pain on 4/3. An x-ray was negative noting only pedal vessel atherosclerosis. He was also seen on 4/22 at that point he was felt to have a cellulitis and abscess of the left foot he was given Silvadene. After her third visit to the ER and a visit to podiatry [Dr. Price] he was referred here. He has been using Silvadene. The patient thinks that he was bitten by something initially, but he did not actually see this The patient has a history of type 2 diabetes although he is not currently on anything for this and his hemoglobin A1c was 5; COPD, dilated cardiomyopathy, mitral regurgitation, CHF, remote CVA with left-sided hemiparesis, hypertension ABI in our clinic was not easy to obtain although we were able to get 0.93 on the left 6/29; I did a debridement on this last time however once again it he comes in with a mostly black eschar at surface. Very tender. We have not yet managed to get arterial studies on him. I will see if I can get them done through Dr. Kennon Holter office 7/9; his  arterial studies were done on 7/1. On the right side his studies are not too bad with noncompressible ABIs at the PTA great TBI of 0.64 and monophasic waveforms on the left however an ABI could not be obtained at the ATA, at the PTA his ABI was 0.2 with a dampened monophasic waveforms there was no pressure in the great toe. He is developed a gangrenous-looking second toes since the last time I saw this patient. There is an ulcer on the medial aspect the second toe. The area on his heel is declining 7/21; since the patient was last here he was revascularized by Dr. Donzetta Matters on 07/21/2018. Noteworthy on the left that the SFA was approximately 50% stenosed at the takeoff but this was not treated. Popliteal artery had heavy areas of calcification and included in one segment and reconstituted just beyond this. This area was stented. After stenting there was 0% residual stenosis. It was noted he appeared to have three-vessel runoff via the tibials which are heavily calcified but without focal occlusions. The patient does not complain of pain. The area on the left lateral heel is a deep necrotic wound it does not look any more viable this week. He has a gangrenous second toe which I do not think is going to be viable. Finally he has a new area on the left posterior calf 7/28; patient arrives with the mummified second toe. The large necrotic wound on the left lateral calcaneus is separating black eschar. He had a new area superiorly on the posterior calf. Home health could not get Iodoflex and he arrived with  calcium alginate. He has been revascularized by Dr. Donzetta Matters patient is not aware when his follow-up appointment is. 8/4-Patient is back to the clinic with the mummified second toe on the left, left calcaneal ulcer with black eschar and visible tendon. His appointment with Dr. Gwenlyn Saran is on the 14th of this month. I have indicated to him that this is important to keep 8/11-8/11-Patient comes back for his left  medial ankle wound, the more distal wound is bigger, has a lot of necrotic tissue, greenish drainage, eschar coating a portion of the wound 8/18; I believe the patient has had follow-up arterial studies ordered by Dr. Donzetta Matters but I do not think they have been done and the patient and his friend are not aware of an appointment time. He has a mummified left second toe and a small area on the left posterior calf in the mid aspect. This substantial area is on the left Achilles area extending into the left lateral heel. This is a deep necrotic wound. It no longer has the black gangrenous surface. His friend says that there is still a malodorous discharge. I note that Dr. Evette Doffing did a culture of this area on 8/12. This grew Enterococcus faecalis and E. coli. He is started on Augmentin although the E. coli only has intermediate sensitivities here. He was referred to infectious disease. We could add either a first or third generation cephalosporin which should cover the E. coli although I did not do this today. 8/25; the patient has a mummified left second toe a substantial wound over the left Achilles area with exposed tendon. And a small area on the left posterior calf. He has been revascularized by Dr. Donzetta Matters in July. He has not made it for his follow-up noninvasive studies. X-ray I did of the left heel last week showed soft tissue gas along the posterior aspect of the heel and soft tissue defect and findings suspicious for osteomyelitis involving the distal aspect of the second proximal phalanx. This is the mummified toe. The patient went to see Dr. Linus Salmons of infectious disease. I am not able to really follow the flow of his note but according to when I am seeing he did not add or suggest any additional antibiotics unless he called these into dialysis. 9/1; I was able to review Dr. Henreitta Leber notes he did not feel any further antibiotics were indicated. I still do not see where the patient had his  postprocedure noninvasive studies. We have been using silver collagen to the substantial area on the left Achilles. This has exposed tendon. He has a mummified left second toe. I am doubtful there is any viable tissue here. The patient is nonambulatory. He has flexion contractures of his left hip and knee. 10/1; the patient has not been here in a month I am really not sure why. He was seen by vein and vascular on 9/25 he had his noninvasive studies done. His monophasic waveforms on the left were noted. He had Dopplers across the stent which was felt to be patent. The patient was seen by Dagoberto Ligas PAC. The arterial duplex today demonstrated a patent popliteal stent with improved TBI. Apparently his caregiver demanded an amputation but this was not felt to be necessary. He is on aspirin and Plavix. Dressings are being changed by home health he was using Prisma. 10/9; patient sees the vascular next week. We will call urgently this morning by home health report that the mom applied left second toe had "almost fallen off". Nevertheless the  patient arrived in my clinic today. He has a lateral left heel wound. 10/16; substantial wound on the left lateral heel extending into the Achilles area. Also a mummified area on the left second toe. I contacted Dr. Donzetta Matters about that earlier in the week. He agrees that the left second toe needs an amputation. Apparently noninvasive Doppler tests in Dr. Claretha Cooper office showed good blood flow according to the patient although I did not see this in the note 11/9; substantial wound on the left lateral heel extending to the Achilles area. The left second toe was amputated by Dr. Donzetta Matters on 10/28. I did not review this area. 12/4; patient has not been here in a month. He has home health. The left second toe amputation site has closed over. The area on the lateral part of his heel extending into the Achilles is a lot better. Thick surrounding material around the wound edge  will inhibit final healing however. 12/28; patient has not been here in a month. He has home health. We are using Hydrofera Blue to the area on the left heel area. He has healed over the left second toe amputation site. 1/11; 2-week follow-up. The patient is doing well his wound is down to a very small area. Copious amounts of callus and debris around the margins though. We have been using Hydrofera Blue. He remains healed over the left second toe amputation site 1/25; The toe amputation site remains healed. He is s/p popliteal artery stent. Presented with a substantial wound on the left lateral heel extending into the achilles area as well as dry ganged of left second toe. He had refused a BKA I believe at one point Electronic Signature(s) Signed: 02/03/2019 5:32:11 PM By: Linton Ham MD Entered By: Linton Ham on 02/03/2019 04:49:55 -------------------------------------------------------------------------------- Physical Exam Details Patient Name: Date of Service: Nathaniel White, Nathaniel White 02/02/2019 2:30 PM Medical Record [id number] Patient Account Number: [id number] Date of Birth/Sex: Treating RN: 1961/01/05 (59 y.o. M) Primary Care Provider: Geryl Rankins Other Clinician: Referring Provider: Treating Provider/Extender:Rossie Scarfone, Rodrigo Ran, [name] in Treatment: 64 Constitutional Patient is hypertensive.. Pulse regular and within target range for patient.Marland Kitchen Respirations regular, non-labored and within target range.. Temperature is normal and within the target range for the patient.Marland Kitchen Appears in no distress. Notes Wound exam; The toe amupution sites are closed. The area on the left lateral ankle and achilles is heavily calloused but no opening is seen Electronic Signature(s) Signed: 02/03/2019 5:32:11 PM By: Linton Ham MD Entered By: Linton Ham on 02/03/2019 04:52:57 -------------------------------------------------------------------------------- Physician  Orders Details Patient Name: Date of Service: Nathaniel White, Nathaniel White 02/02/2019 2:30 PM Medical Record [id number] Patient Account Number: [id number] Date of Birth/Sex: Treating RN: Oct 05, 1960 (59 y.o. [name] Primary Care Provider: Geryl Rankins Other Clinician: Referring Provider: Treating Provider/Extender:Marytza Grandpre, Rodrigo Ran, Paulette Blanch in Treatment: 51 Verbal / Phone Orders: No Diagnosis Coding ICD-10 Coding Code Description E11.621 Type 2 diabetes mellitus with foot ulcer E11.52 Type 2 diabetes mellitus with diabetic peripheral angiopathy with gangrene I87.333 Chronic venous hypertension (idiopathic) with ulcer and inflammation of bilateral lower extremity L97.423 Non-pressure chronic ulcer of left heel and midfoot with necrosis of muscle L97.221 Non-pressure chronic ulcer of left calf limited to breakdown of skin Discharge From [facility] Discharge from [place] Skin Barriers/Peri-Wound Care Moisturizing lotion - to both legs and feet daily Off-Loading Other: - float feet off of bed/chair by placing pillow under legs [facility] home health - SN may discharge for wound care Electronic Signature(s) Signed:  02/02/2019 6:46:58 PM By: Levan Hurst RN, BSN Signed: 02/03/2019 5:32:11 PM By: Linton Ham MD Entered By: Levan Hurst on 02/02/2019 16:34:48 -------------------------------------------------------------------------------- Problem List Details Patient Name: Date of Service: Nathaniel White, Nathaniel White 02/02/2019 2:30 PM Medical Record [id number] Patient Account Number: [id number] Date of Birth/Sex: Treating RN: Dec 06, 1960 (59 y.o. [name] Primary Care Provider: Geryl Rankins Other Clinician: Referring Provider: Treating Provider/Extender:Genice Kimberlin, Rodrigo Ran, Paulette Blanch in Treatment: 31 Active Problems ICD-10 Evaluated Encounter Code Description Active Date Today Diagnosis E11.621 Type 2 diabetes mellitus  with foot ulcer 06/30/2018 No Yes E11.52 Type 2 diabetes mellitus with diabetic peripheral 07/17/2018 No Yes angiopathy with gangrene I87.333 Chronic venous hypertension (idiopathic) with ulcer 06/30/2018 No Yes and inflammation of bilateral lower extremity L97.423 Non-pressure chronic ulcer of left heel and midfoot 07/29/2018 No Yes with necrosis of muscle L97.221 Non-pressure chronic ulcer of left calf limited to 07/29/2018 No Yes breakdown of skin Inactive Problems ICD-10 Code Description Active Date Inactive Date E11.51 Type 2 diabetes mellitus with diabetic peripheral angiopathy 06/30/2018 06/30/2018 without gangrene Resolved Problems Electronic Signature(s) Signed: 02/03/2019 5:32:11 PM By: Linton Ham MD Previous Signature: 02/02/2019 6:46:58 PM Version By: Levan Hurst RN, BSN Entered By: Linton Ham on 02/03/2019 04:44:30 -------------------------------------------------------------------------------- Progress Note Details Patient Name: Date of Service: Nathaniel White 02/02/2019 2:30 PM Medical Record [id number] Patient Account Number: [id number] Date of Birth/Sex: Treating RN: 28-Mar-1960 (59 y.o. M) Primary Care Provider: Geryl Rankins Other Clinician: Referring Provider: Treating Provider/Extender:Marrissa Dai, Rodrigo Ran, Paulette Blanch in Treatment: 31 Subjective History of Present Illness (HPI) ADMISSION 06/30/2018 The patient is a 59 year old man with multiple medical issues including type 2 diabetes on dialysis. Currently the patient his dialysis is actually caused by nephrosclerosis and he has been on dialysis for 20 years. Roughly a month ago according to the patient he noticed a small open area on his left heel he showed it to his caregiver and according to her this rapidly expanded. Looking through [facility] health link he was in the ER with left foot pain on 4/3. An x-ray was negative noting only pedal vessel atherosclerosis. He was also seen on 4/22 at that  point he was felt to have a cellulitis and abscess of the left foot he was given Silvadene. After her third visit to the ER and a visit to podiatry [Dr. Price] he was referred here. He has been using Silvadene. The patient thinks that he was bitten by something initially, but he did not actually see this The patient has a history of type 2 diabetes although he is not currently on anything for this and his hemoglobin A1c was 5; COPD, dilated cardiomyopathy, mitral regurgitation, CHF, remote CVA with left-sided hemiparesis, hypertension ABI in our clinic was not easy to obtain although we were able to get 0.93 on the left 6/29; I did a debridement on this last time however once again it he comes in with a mostly black eschar at surface. Very tender. We have not yet managed to get arterial studies on him. I will see if I can get them done through Dr. Kennon Holter office 7/9; his arterial studies were done on 7/1. On the right side his studies are not too bad with noncompressible ABIs at the PTA great TBI of 0.64 and monophasic waveforms on the left however an ABI could not be obtained at the ATA, at the PTA his ABI was 0.2 with a dampened monophasic waveforms there was no pressure in the great toe. He is developed a gangrenous-looking second toes  since the last time I saw this patient. There is an ulcer on the medial aspect the second toe. The area on his heel is declining 7/21; since the patient was last here he was revascularized by Dr. Donzetta Matters on 07/21/2018. Noteworthy on the left that the SFA was approximately 50% stenosed at the takeoff but this was not treated. Popliteal artery had heavy areas of calcification and included in one segment and reconstituted just beyond this. This area was stented. After stenting there was 0% residual stenosis. It was noted he appeared to have three-vessel runoff via the tibials which are heavily calcified but without focal occlusions. The patient does not complain of  pain. The area on the left lateral heel is a deep necrotic wound it does not look any more viable this week. He has a gangrenous second toe which I do not think is going to be viable. Finally he has a new area on the left posterior calf 7/28; patient arrives with the mummified second toe. The large necrotic wound on the left lateral calcaneus is separating black eschar. He had a new area superiorly on the posterior calf. Home health could not get Iodoflex and he arrived with calcium alginate. He has been revascularized by Dr. Donzetta Matters patient is not aware when his follow-up appointment is. 8/4-Patient is back to the clinic with the mummified second toe on the left, left calcaneal ulcer with black eschar and visible tendon. His appointment with Dr. Gwenlyn Saran is on the 14th of this month. I have indicated to him that this is important to keep 8/11-8/11-Patient comes back for his left medial ankle wound, the more distal wound is bigger, has a lot of necrotic tissue, greenish drainage, eschar coating a portion of the wound 8/18; I believe the patient has had follow-up arterial studies ordered by Dr. Donzetta Matters but I do not think they have been done and the patient and his friend are not aware of an appointment time. He has a mummified left second toe and a small area on the left posterior calf in the mid aspect. This substantial area is on the left Achilles area extending into the left lateral heel. This is a deep necrotic wound. It no longer has the black gangrenous surface. His friend says that there is still a malodorous discharge. I note that Dr. Evette Doffing did a culture of this area on 8/12. This grew Enterococcus faecalis and E. coli. He is started on Augmentin although the E. coli only has intermediate sensitivities here. He was referred to infectious disease. We could add either a first or third generation cephalosporin which should cover the E. coli although I did not do this today. 8/25; the patient has a  mummified left second toe a substantial wound over the left Achilles area with exposed tendon. And a small area on the left posterior calf. He has been revascularized by Dr. Donzetta Matters in July. He has not made it for his follow-up noninvasive studies. X-ray I did of the left heel last week showed soft tissue gas along the posterior aspect of the heel and soft tissue defect and findings suspicious for osteomyelitis involving the distal aspect of the second proximal phalanx. This is the mummified toe. The patient went to see Dr. Linus Salmons of infectious disease. I am not able to really follow the flow of his note but according to when I am seeing he did not add or suggest any additional antibiotics unless he called these into dialysis. 9/1; I was able to review Dr.  Comer's notes he did not feel any further antibiotics were indicated. I still do not see where the patient had his postprocedure noninvasive studies. We have been using silver collagen to the substantial area on the left Achilles. This has exposed tendon. He has a mummified left second toe. I am doubtful there is any viable tissue here. The patient is nonambulatory. He has flexion contractures of his left hip and knee. 10/1; the patient has not been here in a month I am really not sure why. He was seen by vein and vascular on 9/25 he had his noninvasive studies done. His monophasic waveforms on the left were noted. He had Dopplers across the stent which was felt to be patent. The patient was seen by Dagoberto Ligas PAC. The arterial duplex today demonstrated a patent popliteal stent with improved TBI. Apparently his caregiver demanded an amputation but this was not felt to be necessary. He is on aspirin and Plavix. Dressings are being changed by home health he was using Prisma. 10/9; patient sees the vascular next week. We will call urgently this morning by home health report that the mom applied left second toe had "almost fallen off". Nevertheless  the patient arrived in my clinic today. He has a lateral left heel wound. 10/16; substantial wound on the left lateral heel extending into the Achilles area. Also a mummified area on the left second toe. I contacted Dr. Donzetta Matters about that earlier in the week. He agrees that the left second toe needs an amputation. Apparently noninvasive Doppler tests in Dr. Claretha Cooper office showed good blood flow according to the patient although I did not see this in the note 11/9; substantial wound on the left lateral heel extending to the Achilles area. The left second toe was amputated by Dr. Donzetta Matters on 10/28. I did not review this area. 12/4; patient has not been here in a month. He has home health. The left second toe amputation site has closed over. The area on the lateral part of his heel extending into the Achilles is a lot better. Thick surrounding material around the wound edge will inhibit final healing however. 12/28; patient has not been here in a month. He has home health. We are using Hydrofera Blue to the area on the left heel area. He has healed over the left second toe amputation site. 1/11; 2-week follow-up. The patient is doing well his wound is down to a very small area. Copious amounts of callus and debris around the margins though. We have been using Hydrofera Blue. He remains healed over the left second toe amputation site 1/25; The toe amputation site remains healed. He is s/p popliteal artery stent. Presented with a substantial wound on the left lateral heel extending into the achilles area as well as dry ganged of left second toe. He had refused a BKA I believe at one point Objective Constitutional Patient is hypertensive.. Pulse regular and within target range for patient.Marland Kitchen Respirations regular, non-labored and within target range.. Temperature is normal and within the target range for the patient.Marland Kitchen Appears in no distress. Vitals Time Taken: 3:50 PM, Height: 69 in, Weight: 170 lbs, BMI:  25.1, Temperature: 98.1 F, Pulse: 102 bpm, Respiratory Rate: 19 breaths/min, Blood Pressure: 153/95 mmHg. General Notes: Wound exam; The toe amupution sites are closed. The area on the left lateral ankle and achilles is heavily calloused but no opening is seen Integumentary (Hair, Skin) Wound #1 status is Healed - Epithelialized. Original cause of wound was Gradually Appeared. The  wound is located on the Left,Lateral Calcaneus. The wound measures 0cm length x 0cm width x 0cm depth; 0cm^2 area and 0cm^3 volume. There is no tunneling or undermining noted. There is a none present amount of drainage noted. The wound margin is well defined and not attached to the wound base. There is no granulation within the wound bed. There is no necrotic tissue within the wound bed. Assessment Active Problems ICD-10 Type 2 diabetes mellitus with foot ulcer Type 2 diabetes mellitus with diabetic peripheral angiopathy with gangrene Chronic venous hypertension (idiopathic) with ulcer and inflammation of bilateral lower extremity Non-pressure chronic ulcer of left heel and midfoot with necrosis of muscle Non-pressure chronic ulcer of left calf limited to breakdown of skin Plan Discharge From [facility] Services: Discharge from [place] Skin Barriers/Peri-Wound Care: Moisturizing lotion - to both legs and feet daily Off-Loading: Other: - float feet off of bed/chair by placing pillow under legs Home Health: Discontinue home health - SN may discharge for wound care 1. Although the area on the lateral ankle and heel is calloused I elected not to attempt to remove this. I would simply follow this over time. I have asked him to keep this area moisturized. Amputation site is closed suggesting adequate blood supply at least tothe anterior foot 2 I have discharged him from the clinic. He is to call us as necessary Electronic Signature(s) Signed: 02/03/2019 5:32:11 PM By: Linton Ham MD Entered By: Linton Ham on 02/03/2019 04:56:11 -------------------------------------------------------------------------------- SuperBill Details Patient Name: Date of Service: Nathaniel White 02/02/2019 Medical Record [phone] Patient Account Number: [id number] Date of Birth/Sex: Treating RN: [birth year] (59 y.o. 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

## 2019-02-04 DIAGNOSIS — Z4800 Encounter for change or removal of nonsurgical wound dressing: Secondary | ICD-10-CM | POA: Diagnosis not present

## 2019-02-04 DIAGNOSIS — E1122 Type 2 diabetes mellitus with diabetic chronic kidney disease: Secondary | ICD-10-CM | POA: Diagnosis not present

## 2019-02-04 DIAGNOSIS — E11621 Type 2 diabetes mellitus with foot ulcer: Secondary | ICD-10-CM | POA: Diagnosis not present

## 2019-02-04 DIAGNOSIS — L97422 Non-pressure chronic ulcer of left heel and midfoot with fat layer exposed: Secondary | ICD-10-CM | POA: Diagnosis not present

## 2019-02-04 DIAGNOSIS — E1151 Type 2 diabetes mellitus with diabetic peripheral angiopathy without gangrene: Secondary | ICD-10-CM | POA: Diagnosis not present

## 2019-02-04 DIAGNOSIS — L97522 Non-pressure chronic ulcer of other part of left foot with fat layer exposed: Secondary | ICD-10-CM | POA: Diagnosis not present

## 2019-02-05 DIAGNOSIS — E119 Type 2 diabetes mellitus without complications: Secondary | ICD-10-CM | POA: Diagnosis not present

## 2019-02-05 DIAGNOSIS — N186 End stage renal disease: Secondary | ICD-10-CM | POA: Diagnosis not present

## 2019-02-05 DIAGNOSIS — Z992 Dependence on renal dialysis: Secondary | ICD-10-CM | POA: Diagnosis not present

## 2019-02-05 DIAGNOSIS — N2581 Secondary hyperparathyroidism of renal origin: Secondary | ICD-10-CM | POA: Diagnosis not present

## 2019-02-05 NOTE — Progress Notes (Signed)
WILIAN, KWONG ([id number]) Visit Report for 02/02/2019 Arrival Information Details Patient Name: Date of Service: REYNOLDO, MAINER 02/02/2019 2:30 PM Medical Record [id number] Patient Account Number: [id number] Date of Birth/Sex: Treating RN: 05/07/1960 (59 y.o. [name] Primary Care Jamika Sadek: Geryl Rankins Other Clinician: Referring Nora Sabey: Treating Hayes Czaja/Extender:Robson, Rodrigo Ran, Paulette Blanch in Treatment: 5 Visit Information History Since Last Visit Added or deleted any medications: No Patient Arrived: Wheel Chair Any new allergies or adverse reactions: No Arrival Time: 15:57 Had a fall or experienced change in No activities of daily living that may affect Accompanied By: self risk of falls: Transfer Assistance: Manual Signs or symptoms of abuse/neglect since last No Patient Identification Verified: Yes visito Secondary Verification Process Completed: Yes Hospitalized since last visit: No Patient Requires Transmission-Based No Implantable device outside of the clinic excluding No Precautions: cellular tissue based products placed in the center Patient Has Alerts: No since last visit: Has Dressing in Place as Prescribed: Yes Pain Present Now: No Electronic Signature(s) Signed: 02/02/2019 6:46:58 PM By: Levan Hurst RN, BSN Entered By: Levan Hurst on 02/02/2019 18:31:11 -------------------------------------------------------------------------------- Clinic Level of Care Assessment Details Patient Name: Date of Service: SARATH, PRIVOTT 02/02/2019 2:30 PM Medical Record [id number] Patient Account Number: [id number] Date of Birth/Sex: Treating RN: 1960-12-21 (59 y.o. [name] Primary Care Kelicia Youtz: Geryl Rankins Other Clinician: Referring Garion Wempe: Treating Athel Merriweather/Extender:Robson, Rodrigo Ran, Paulette Blanch in Treatment: 31 Clinic Level of Care Assessment Items TOOL 4 Quantity Score X - Use when only an EandM  is performed on FOLLOW-UP visit 1 0 ASSESSMENTS - Nursing Assessment / Reassessment X - Reassessment of Co-morbidities (includes updates in patient status) 1 10 X - Reassessment of Adherence to Treatment Plan 1 5 ASSESSMENTS - Wound and Skin Assessment / Reassessment X - Simple Wound Assessment / Reassessment - one wound 1 5 []  - Complex Wound Assessment / Reassessment - multiple wounds 0 []  - Dermatologic / Skin Assessment (not related to wound area) 0 ASSESSMENTS - Focused Assessment []  - Circumferential Edema Measurements - multi extremities 0 []  - Nutritional Assessment / Counseling / Intervention 0 X - Lower Extremity Assessment (monofilament, tuning fork, pulses) 1 5 []  - Peripheral Arterial Disease Assessment (using hand held doppler) 0 ASSESSMENTS - Ostomy and/or Continence Assessment and Care []  - Incontinence Assessment and Management 0 []  - Ostomy Care Assessment and Management (repouching, etc.) 0 PROCESS - Coordination of Care X - Simple Patient / Family Education for ongoing care 1 15 []  - Complex (extensive) Patient / Family Education for ongoing care 0 X - Staff obtains Programmer, systems, Records, Test Results / Process Orders 1 10 X - Staff telephones HHA, Nursing Homes / Clarify orders / etc 1 10 []  - Routine Transfer to another Facility (non-emergent condition) 0 []  - Routine Hospital Admission (non-emergent condition) 0 []  - New Admissions / Biomedical engineer / Ordering NPWT, Apligraf, etc. 0 []  - Emergency Hospital Admission (emergent condition) 0 X - Simple Discharge Coordination 1 10 []  - Complex (extensive) Discharge Coordination 0 PROCESS - Special Needs []  - Pediatric / Minor Patient Management 0 []  - Isolation Patient Management 0 []  - Hearing / Language / Visual special needs 0 []  - Assessment of Community assistance (transportation, D/C planning, etc.) 0 []  - Additional assistance / Altered mentation 0 []  - Support Surface(s) Assessment (bed, cushion, seat,  etc.) 0 INTERVENTIONS - Wound Cleansing / Measurement X - Simple Wound Cleansing - one wound 1 5 []  - Complex Wound Cleansing - multiple wounds 0 X -  Wound Imaging (photographs - any number of wounds) 1 5 []  - Wound Tracing (instead of photographs) 0 X - Simple Wound Measurement - one wound 1 5 []  - Complex Wound Measurement - multiple wounds 0 INTERVENTIONS - Wound Dressings []  - Small Wound Dressing one or multiple wounds 0 []  - Medium Wound Dressing one or multiple wounds 0 []  - Large Wound Dressing one or multiple wounds 0 []  - Application of Medications - topical 0 []  - Application of Medications - injection 0 INTERVENTIONS - Miscellaneous []  - External ear exam 0 []  - Specimen Collection (cultures, biopsies, blood, body fluids, etc.) 0 []  - Specimen(s) / Culture(s) sent or taken to Lab for analysis 0 []  - Patient Transfer (multiple staff / Civil Service fast streamer / Similar devices) 0 []  - Simple Staple / Suture removal (25 or less) 0 []  - Complex Staple / Suture removal (26 or more) 0 []  - Hypo / Hyperglycemic Management (close monitor of Blood Glucose) 0 []  - Ankle / Brachial Index (ABI) - do not check if billed separately 0 X - Vital Signs 1 5 Has the patient been seen at the hospital within the last three years: Yes Total Score: 90 Level Of Care: New/Established - Level 3 Electronic Signature(s) Signed: 02/02/2019 6:46:58 PM By: Levan Hurst RN, BSN Entered By: Levan Hurst on 02/02/2019 18:28:23 -------------------------------------------------------------------------------- Lower Extremity Assessment Details Patient Name: Date of Service: [name] 02/02/2019 2:30 PM Medical Record [id number] Patient Account Number: [id number] Date of Birth/Sex: Treating RN: 1960-10-08 (60 y.o. [name] Primary Care Guss Farruggia: Geryl Rankins Other Clinician: Referring Brandice Busser: Treating Lahela Woodin/Extender:Robson, Rodrigo Ran, [name] in Treatment: 31 Edema  Assessment Assessed: [Left: No] [Right: No] Edema: [Left: Ye] [Right: s] Calf Left: Right: Point of Measurement: 40 cm From Medial Instep 34 cm cm Ankle Left: Right: Point of Measurement: 10 cm From Medial Instep 25.2 cm cm Vascular Assessment Pulses: Dorsalis Pedis Palpable: [Left:Yes] Electronic Signature(s) Signed: 02/05/2019 7:44:20 AM By: Kela Millin Entered By: Kela Millin on 02/02/2019 15:58:56 -------------------------------------------------------------------------------- Multi-Disciplinary Care Plan Details Patient Name: Date of Service: [name] 02/02/2019 2:30 PM Medical Record [id number] Patient Account Number: [id number] Date of Birth/Sex: Treating RN: 03/30/1960 (59 y.o. [name] Primary Care Sevin Langenbach: Geryl Rankins Other Clinician: Referring Tashan Kreitzer: Treating Lynzie Cliburn/Extender:Robson, Rodrigo Ran, Paulette Blanch in Treatment: 31 Active Inactive Electronic Signature(s) Signed: 02/02/2019 6:46:58 PM By: Levan Hurst RN, BSN Entered By: Levan Hurst on 02/02/2019 18:27:54 -------------------------------------------------------------------------------- Pain Assessment Details Patient Name: Date of Service: EMRAN, MOLZAHN 02/02/2019 2:30 PM Medical Record [id number] Patient Account Number: [id number] Date of Birth/Sex: Treating RN: 1960-01-25 (59 y.o. [name] Primary Care Kendelle Schweers: Geryl Rankins Other Clinician: Referring Tilford Deaton: Treating Brodie Correll/Extender:Robson, Rodrigo Ran, Paulette Blanch in Treatment: 31 Active Problems Location of Pain Severity and Description of Pain Patient Has Paino No Site Locations Pain Management and Medication Current Pain Management: Electronic Signature(s) Signed: 02/05/2019 7:44:20 AM By: Kela Millin Entered By: Kela Millin on 02/02/2019 15:58:34 -------------------------------------------------------------------------------- Patient/Caregiver  Education Details Patient Name: Date of Service: [name] 1/25/2021andnbsp2:30 PM Medical Record [phone] Patient Account Number: [id number] Date of Birth/Gender: Treating RN: 1961/01/05 (59 y.o. [name] Primary Care Physician: Geryl Rankins Other Clinician: Referring Physician: Treating Physician/Extender:Robson, Rodrigo Ran, Paulette Blanch in Treatment: 54 Education Assessment Education Provided To: Patient Education Topics Provided Wound/Skin Impairment: Methods: Explain/Verbal Responses: State content correctly Electronic Signature(s) Signed: 02/02/2019 6:46:58 PM By: Levan Hurst RN, BSN Entered By: Levan Hurst on 02/02/2019 18:28:03 -------------------------------------------------------------------------------- Wound Assessment Details Patient Name:  Date of Service: CURT, OATIS 02/02/2019 2:30 PM Medical Record [id number] Patient Account Number: [id number] Date of Birth/Sex: Treating RN: 01/10/1960 (59 y.o. M) Primary Care Essam Lowdermilk: Geryl Rankins Other Clinician: Referring Keron Koffman: Treating Jakyia Gaccione/Extender:Robson, Rodrigo Ran, [name] in Treatment: 31 Wound Status Wound Number: 1 Primary Diabetic Wound/Ulcer of the Lower Extremity Etiology: Wound Location: Left Calcaneus - Lateral Wound Healed - Epithelialized Wounding Event: Gradually Appeared Status: Date Acquired: 06/02/2018 Comorbid Chronic Obstructive Pulmonary Disease Weeks Of Treatment: 31 History: (COPD), Congestive Heart Failure, Clustered Wound: No Hypertension, Type II Diabetes, End Stage Renal Disease Photos Wound Measurements Length: (cm) 0 % Reductio Width: (cm) 0 % Reductio Depth: (cm) 0 Epithelial Area: (cm) 0 Tunneling Volume: (cm) 0 Undermini Wound Description Classification: Grade 4 Wound Margin: Well defined, not attached Exudate Amount: None Present Wound Bed Granulation Amount: None Present (0%) Necrotic Amount: None  Present (0%) Foul Odor After Cleansing: No Slough/Fibrino No Exposed Structure Fascia Exposed: No Fat Layer (Subcutaneous Tissue) Exposed: No Tendon Exposed: No Muscle Exposed: No Joint Exposed: No Bone Exposed: No n in Area: 100% n in Volume: 100% ization: Large (67-100%) : No ng: No Electronic Signature(s) Signed: 02/03/2019 5:09:30 PM By: Mikeal Hawthorne EMT/HBOT Previous Signature: 02/02/2019 6:46:58 PM Version By: Levan Hurst RN, BSN Entered By: Mikeal Hawthorne on 02/03/2019 13:36:18 -------------------------------------------------------------------------------- [place] Details Patient Name: Date of Service: [name] 02/02/2019 2:30 PM Medical Record [id number] Patient Account Number: [id number] Date of Birth/Sex: Treating RN: Jul 01, 1960 (59 y.o. [name] Primary Care Patrycja Mumpower: Geryl Rankins Other Clinician: Referring Ariyannah Pauling: Treating Blessyn [place]/Extender:Robson, Rodrigo Ran, [name] in Treatment: 31 Vital Signs Time Taken: 15:50 Temperature (F): 98.1 Height (in): 69 Pulse (bpm): 102 Weight (lbs): 170 Respiratory Rate (breaths/min): 19 Body Mass Index (BMI): 25.1 Blood Pressure (mmHg): 153/95 Reference Range: 80 - 120 mg / dl Electronic Signature(s) Signed: 02/05/2019 7:44:20 AM By: Kela Millin Entered By: Kela Millin on 02/02/2019 15:58:13

## 2019-02-06 DIAGNOSIS — E1122 Type 2 diabetes mellitus with diabetic chronic kidney disease: Secondary | ICD-10-CM | POA: Diagnosis not present

## 2019-02-06 DIAGNOSIS — Z4800 Encounter for change or removal of nonsurgical wound dressing: Secondary | ICD-10-CM | POA: Diagnosis not present

## 2019-02-06 DIAGNOSIS — L97422 Non-pressure chronic ulcer of left heel and midfoot with fat layer exposed: Secondary | ICD-10-CM | POA: Diagnosis not present

## 2019-02-06 DIAGNOSIS — E11621 Type 2 diabetes mellitus with foot ulcer: Secondary | ICD-10-CM | POA: Diagnosis not present

## 2019-02-06 DIAGNOSIS — L97522 Non-pressure chronic ulcer of other part of left foot with fat layer exposed: Secondary | ICD-10-CM | POA: Diagnosis not present

## 2019-02-06 DIAGNOSIS — E1151 Type 2 diabetes mellitus with diabetic peripheral angiopathy without gangrene: Secondary | ICD-10-CM | POA: Diagnosis not present

## 2019-02-07 DIAGNOSIS — Z992 Dependence on renal dialysis: Secondary | ICD-10-CM | POA: Diagnosis not present

## 2019-02-07 DIAGNOSIS — E119 Type 2 diabetes mellitus without complications: Secondary | ICD-10-CM | POA: Diagnosis not present

## 2019-02-07 DIAGNOSIS — N2581 Secondary hyperparathyroidism of renal origin: Secondary | ICD-10-CM | POA: Diagnosis not present

## 2019-02-07 DIAGNOSIS — N186 End stage renal disease: Secondary | ICD-10-CM | POA: Diagnosis not present

## 2019-02-09 DIAGNOSIS — Z992 Dependence on renal dialysis: Secondary | ICD-10-CM | POA: Diagnosis not present

## 2019-02-09 DIAGNOSIS — N2581 Secondary hyperparathyroidism of renal origin: Secondary | ICD-10-CM | POA: Diagnosis not present

## 2019-02-09 DIAGNOSIS — Z4800 Encounter for change or removal of nonsurgical wound dressing: Secondary | ICD-10-CM | POA: Diagnosis not present

## 2019-02-09 DIAGNOSIS — N186 End stage renal disease: Secondary | ICD-10-CM | POA: Diagnosis not present

## 2019-02-09 DIAGNOSIS — E119 Type 2 diabetes mellitus without complications: Secondary | ICD-10-CM | POA: Diagnosis not present

## 2019-02-09 DIAGNOSIS — E1151 Type 2 diabetes mellitus with diabetic peripheral angiopathy without gangrene: Secondary | ICD-10-CM | POA: Diagnosis not present

## 2019-02-09 DIAGNOSIS — L97522 Non-pressure chronic ulcer of other part of left foot with fat layer exposed: Secondary | ICD-10-CM | POA: Diagnosis not present

## 2019-02-09 DIAGNOSIS — E11621 Type 2 diabetes mellitus with foot ulcer: Secondary | ICD-10-CM | POA: Diagnosis not present

## 2019-02-09 DIAGNOSIS — T8612 Kidney transplant failure: Secondary | ICD-10-CM | POA: Diagnosis not present

## 2019-02-09 DIAGNOSIS — E1122 Type 2 diabetes mellitus with diabetic chronic kidney disease: Secondary | ICD-10-CM | POA: Diagnosis not present

## 2019-02-09 DIAGNOSIS — L97422 Non-pressure chronic ulcer of left heel and midfoot with fat layer exposed: Secondary | ICD-10-CM | POA: Diagnosis not present

## 2019-02-10 DIAGNOSIS — E1122 Type 2 diabetes mellitus with diabetic chronic kidney disease: Secondary | ICD-10-CM | POA: Diagnosis not present

## 2019-02-10 DIAGNOSIS — N186 End stage renal disease: Secondary | ICD-10-CM | POA: Diagnosis not present

## 2019-02-10 DIAGNOSIS — Z992 Dependence on renal dialysis: Secondary | ICD-10-CM | POA: Diagnosis not present

## 2019-02-10 DIAGNOSIS — L97522 Non-pressure chronic ulcer of other part of left foot with fat layer exposed: Secondary | ICD-10-CM | POA: Diagnosis not present

## 2019-02-10 DIAGNOSIS — N2581 Secondary hyperparathyroidism of renal origin: Secondary | ICD-10-CM | POA: Diagnosis not present

## 2019-02-10 DIAGNOSIS — E119 Type 2 diabetes mellitus without complications: Secondary | ICD-10-CM | POA: Diagnosis not present

## 2019-02-10 DIAGNOSIS — L97422 Non-pressure chronic ulcer of left heel and midfoot with fat layer exposed: Secondary | ICD-10-CM | POA: Diagnosis not present

## 2019-02-10 DIAGNOSIS — E11621 Type 2 diabetes mellitus with foot ulcer: Secondary | ICD-10-CM | POA: Diagnosis not present

## 2019-02-10 DIAGNOSIS — E1151 Type 2 diabetes mellitus with diabetic peripheral angiopathy without gangrene: Secondary | ICD-10-CM | POA: Diagnosis not present

## 2019-02-10 DIAGNOSIS — Z4800 Encounter for change or removal of nonsurgical wound dressing: Secondary | ICD-10-CM | POA: Diagnosis not present

## 2019-02-12 DIAGNOSIS — E119 Type 2 diabetes mellitus without complications: Secondary | ICD-10-CM | POA: Diagnosis not present

## 2019-02-12 DIAGNOSIS — N2581 Secondary hyperparathyroidism of renal origin: Secondary | ICD-10-CM | POA: Diagnosis not present

## 2019-02-12 DIAGNOSIS — Z992 Dependence on renal dialysis: Secondary | ICD-10-CM | POA: Diagnosis not present

## 2019-02-12 DIAGNOSIS — N186 End stage renal disease: Secondary | ICD-10-CM | POA: Diagnosis not present

## 2019-02-14 DIAGNOSIS — E119 Type 2 diabetes mellitus without complications: Secondary | ICD-10-CM | POA: Diagnosis not present

## 2019-02-14 DIAGNOSIS — N186 End stage renal disease: Secondary | ICD-10-CM | POA: Diagnosis not present

## 2019-02-14 DIAGNOSIS — Z992 Dependence on renal dialysis: Secondary | ICD-10-CM | POA: Diagnosis not present

## 2019-02-14 DIAGNOSIS — N2581 Secondary hyperparathyroidism of renal origin: Secondary | ICD-10-CM | POA: Diagnosis not present

## 2019-02-16 DIAGNOSIS — Z4800 Encounter for change or removal of nonsurgical wound dressing: Secondary | ICD-10-CM | POA: Diagnosis not present

## 2019-02-16 DIAGNOSIS — L97522 Non-pressure chronic ulcer of other part of left foot with fat layer exposed: Secondary | ICD-10-CM | POA: Diagnosis not present

## 2019-02-16 DIAGNOSIS — L97422 Non-pressure chronic ulcer of left heel and midfoot with fat layer exposed: Secondary | ICD-10-CM | POA: Diagnosis not present

## 2019-02-16 DIAGNOSIS — E11621 Type 2 diabetes mellitus with foot ulcer: Secondary | ICD-10-CM | POA: Diagnosis not present

## 2019-02-16 DIAGNOSIS — E1122 Type 2 diabetes mellitus with diabetic chronic kidney disease: Secondary | ICD-10-CM | POA: Diagnosis not present

## 2019-02-16 DIAGNOSIS — E1151 Type 2 diabetes mellitus with diabetic peripheral angiopathy without gangrene: Secondary | ICD-10-CM | POA: Diagnosis not present

## 2019-02-17 DIAGNOSIS — N2581 Secondary hyperparathyroidism of renal origin: Secondary | ICD-10-CM | POA: Diagnosis not present

## 2019-02-17 DIAGNOSIS — N186 End stage renal disease: Secondary | ICD-10-CM | POA: Diagnosis not present

## 2019-02-17 DIAGNOSIS — Z992 Dependence on renal dialysis: Secondary | ICD-10-CM | POA: Diagnosis not present

## 2019-02-17 DIAGNOSIS — L97522 Non-pressure chronic ulcer of other part of left foot with fat layer exposed: Secondary | ICD-10-CM | POA: Diagnosis not present

## 2019-02-17 DIAGNOSIS — E11621 Type 2 diabetes mellitus with foot ulcer: Secondary | ICD-10-CM | POA: Diagnosis not present

## 2019-02-17 DIAGNOSIS — Z4800 Encounter for change or removal of nonsurgical wound dressing: Secondary | ICD-10-CM | POA: Diagnosis not present

## 2019-02-17 DIAGNOSIS — E1122 Type 2 diabetes mellitus with diabetic chronic kidney disease: Secondary | ICD-10-CM | POA: Diagnosis not present

## 2019-02-17 DIAGNOSIS — E1151 Type 2 diabetes mellitus with diabetic peripheral angiopathy without gangrene: Secondary | ICD-10-CM | POA: Diagnosis not present

## 2019-02-17 DIAGNOSIS — E119 Type 2 diabetes mellitus without complications: Secondary | ICD-10-CM | POA: Diagnosis not present

## 2019-02-17 DIAGNOSIS — L97422 Non-pressure chronic ulcer of left heel and midfoot with fat layer exposed: Secondary | ICD-10-CM | POA: Diagnosis not present

## 2019-02-19 DIAGNOSIS — N2581 Secondary hyperparathyroidism of renal origin: Secondary | ICD-10-CM | POA: Diagnosis not present

## 2019-02-19 DIAGNOSIS — N186 End stage renal disease: Secondary | ICD-10-CM | POA: Diagnosis not present

## 2019-02-19 DIAGNOSIS — Z992 Dependence on renal dialysis: Secondary | ICD-10-CM | POA: Diagnosis not present

## 2019-02-19 DIAGNOSIS — E119 Type 2 diabetes mellitus without complications: Secondary | ICD-10-CM | POA: Diagnosis not present

## 2019-02-21 DIAGNOSIS — N186 End stage renal disease: Secondary | ICD-10-CM | POA: Diagnosis not present

## 2019-02-21 DIAGNOSIS — E119 Type 2 diabetes mellitus without complications: Secondary | ICD-10-CM | POA: Diagnosis not present

## 2019-02-21 DIAGNOSIS — Z992 Dependence on renal dialysis: Secondary | ICD-10-CM | POA: Diagnosis not present

## 2019-02-21 DIAGNOSIS — N2581 Secondary hyperparathyroidism of renal origin: Secondary | ICD-10-CM | POA: Diagnosis not present

## 2019-02-23 DIAGNOSIS — E11621 Type 2 diabetes mellitus with foot ulcer: Secondary | ICD-10-CM | POA: Diagnosis not present

## 2019-02-23 DIAGNOSIS — L97422 Non-pressure chronic ulcer of left heel and midfoot with fat layer exposed: Secondary | ICD-10-CM | POA: Diagnosis not present

## 2019-02-23 DIAGNOSIS — E1122 Type 2 diabetes mellitus with diabetic chronic kidney disease: Secondary | ICD-10-CM | POA: Diagnosis not present

## 2019-02-23 DIAGNOSIS — L97522 Non-pressure chronic ulcer of other part of left foot with fat layer exposed: Secondary | ICD-10-CM | POA: Diagnosis not present

## 2019-02-23 DIAGNOSIS — E1151 Type 2 diabetes mellitus with diabetic peripheral angiopathy without gangrene: Secondary | ICD-10-CM | POA: Diagnosis not present

## 2019-02-23 DIAGNOSIS — Z4800 Encounter for change or removal of nonsurgical wound dressing: Secondary | ICD-10-CM | POA: Diagnosis not present

## 2019-02-24 DIAGNOSIS — E119 Type 2 diabetes mellitus without complications: Secondary | ICD-10-CM | POA: Diagnosis not present

## 2019-02-24 DIAGNOSIS — N186 End stage renal disease: Secondary | ICD-10-CM | POA: Diagnosis not present

## 2019-02-24 DIAGNOSIS — Z992 Dependence on renal dialysis: Secondary | ICD-10-CM | POA: Diagnosis not present

## 2019-02-24 DIAGNOSIS — N2581 Secondary hyperparathyroidism of renal origin: Secondary | ICD-10-CM | POA: Diagnosis not present

## 2019-02-25 DIAGNOSIS — L97422 Non-pressure chronic ulcer of left heel and midfoot with fat layer exposed: Secondary | ICD-10-CM | POA: Diagnosis not present

## 2019-02-25 DIAGNOSIS — Z4800 Encounter for change or removal of nonsurgical wound dressing: Secondary | ICD-10-CM | POA: Diagnosis not present

## 2019-02-25 DIAGNOSIS — E1151 Type 2 diabetes mellitus with diabetic peripheral angiopathy without gangrene: Secondary | ICD-10-CM | POA: Diagnosis not present

## 2019-02-25 DIAGNOSIS — L97522 Non-pressure chronic ulcer of other part of left foot with fat layer exposed: Secondary | ICD-10-CM | POA: Diagnosis not present

## 2019-02-25 DIAGNOSIS — E1122 Type 2 diabetes mellitus with diabetic chronic kidney disease: Secondary | ICD-10-CM | POA: Diagnosis not present

## 2019-02-25 DIAGNOSIS — E11621 Type 2 diabetes mellitus with foot ulcer: Secondary | ICD-10-CM | POA: Diagnosis not present

## 2019-02-26 DIAGNOSIS — Z992 Dependence on renal dialysis: Secondary | ICD-10-CM | POA: Diagnosis not present

## 2019-02-26 DIAGNOSIS — N186 End stage renal disease: Secondary | ICD-10-CM | POA: Diagnosis not present

## 2019-02-26 DIAGNOSIS — E119 Type 2 diabetes mellitus without complications: Secondary | ICD-10-CM | POA: Diagnosis not present

## 2019-02-26 DIAGNOSIS — N2581 Secondary hyperparathyroidism of renal origin: Secondary | ICD-10-CM | POA: Diagnosis not present

## 2019-02-27 DIAGNOSIS — I132 Hypertensive heart and chronic kidney disease with heart failure and with stage 5 chronic kidney disease, or end stage renal disease: Secondary | ICD-10-CM | POA: Diagnosis not present

## 2019-02-27 DIAGNOSIS — J449 Chronic obstructive pulmonary disease, unspecified: Secondary | ICD-10-CM | POA: Diagnosis not present

## 2019-02-27 DIAGNOSIS — Z9981 Dependence on supplemental oxygen: Secondary | ICD-10-CM | POA: Diagnosis not present

## 2019-02-27 DIAGNOSIS — Z87891 Personal history of nicotine dependence: Secondary | ICD-10-CM | POA: Diagnosis not present

## 2019-02-27 DIAGNOSIS — Z992 Dependence on renal dialysis: Secondary | ICD-10-CM | POA: Diagnosis not present

## 2019-02-27 DIAGNOSIS — E1122 Type 2 diabetes mellitus with diabetic chronic kidney disease: Secondary | ICD-10-CM | POA: Diagnosis not present

## 2019-02-27 DIAGNOSIS — N186 End stage renal disease: Secondary | ICD-10-CM | POA: Diagnosis not present

## 2019-02-27 DIAGNOSIS — E1151 Type 2 diabetes mellitus with diabetic peripheral angiopathy without gangrene: Secondary | ICD-10-CM | POA: Diagnosis not present

## 2019-02-27 DIAGNOSIS — I509 Heart failure, unspecified: Secondary | ICD-10-CM | POA: Diagnosis not present

## 2019-02-27 DIAGNOSIS — I69354 Hemiplegia and hemiparesis following cerebral infarction affecting left non-dominant side: Secondary | ICD-10-CM | POA: Diagnosis not present

## 2019-02-27 DIAGNOSIS — I420 Dilated cardiomyopathy: Secondary | ICD-10-CM | POA: Diagnosis not present

## 2019-02-28 DIAGNOSIS — Z992 Dependence on renal dialysis: Secondary | ICD-10-CM | POA: Diagnosis not present

## 2019-02-28 DIAGNOSIS — N186 End stage renal disease: Secondary | ICD-10-CM | POA: Diagnosis not present

## 2019-02-28 DIAGNOSIS — E119 Type 2 diabetes mellitus without complications: Secondary | ICD-10-CM | POA: Diagnosis not present

## 2019-02-28 DIAGNOSIS — N2581 Secondary hyperparathyroidism of renal origin: Secondary | ICD-10-CM | POA: Diagnosis not present

## 2019-03-02 DIAGNOSIS — E1151 Type 2 diabetes mellitus with diabetic peripheral angiopathy without gangrene: Secondary | ICD-10-CM | POA: Diagnosis not present

## 2019-03-02 DIAGNOSIS — I69354 Hemiplegia and hemiparesis following cerebral infarction affecting left non-dominant side: Secondary | ICD-10-CM | POA: Diagnosis not present

## 2019-03-02 DIAGNOSIS — I509 Heart failure, unspecified: Secondary | ICD-10-CM | POA: Diagnosis not present

## 2019-03-02 DIAGNOSIS — N186 End stage renal disease: Secondary | ICD-10-CM | POA: Diagnosis not present

## 2019-03-02 DIAGNOSIS — I132 Hypertensive heart and chronic kidney disease with heart failure and with stage 5 chronic kidney disease, or end stage renal disease: Secondary | ICD-10-CM | POA: Diagnosis not present

## 2019-03-02 DIAGNOSIS — E1122 Type 2 diabetes mellitus with diabetic chronic kidney disease: Secondary | ICD-10-CM | POA: Diagnosis not present

## 2019-03-03 DIAGNOSIS — N2581 Secondary hyperparathyroidism of renal origin: Secondary | ICD-10-CM | POA: Diagnosis not present

## 2019-03-03 DIAGNOSIS — Z992 Dependence on renal dialysis: Secondary | ICD-10-CM | POA: Diagnosis not present

## 2019-03-03 DIAGNOSIS — E119 Type 2 diabetes mellitus without complications: Secondary | ICD-10-CM | POA: Diagnosis not present

## 2019-03-03 DIAGNOSIS — N186 End stage renal disease: Secondary | ICD-10-CM | POA: Diagnosis not present

## 2019-03-05 DIAGNOSIS — N186 End stage renal disease: Secondary | ICD-10-CM | POA: Diagnosis not present

## 2019-03-05 DIAGNOSIS — N2581 Secondary hyperparathyroidism of renal origin: Secondary | ICD-10-CM | POA: Diagnosis not present

## 2019-03-05 DIAGNOSIS — Z992 Dependence on renal dialysis: Secondary | ICD-10-CM | POA: Diagnosis not present

## 2019-03-05 DIAGNOSIS — E119 Type 2 diabetes mellitus without complications: Secondary | ICD-10-CM | POA: Diagnosis not present

## 2019-03-06 DIAGNOSIS — I69354 Hemiplegia and hemiparesis following cerebral infarction affecting left non-dominant side: Secondary | ICD-10-CM | POA: Diagnosis not present

## 2019-03-06 DIAGNOSIS — E1151 Type 2 diabetes mellitus with diabetic peripheral angiopathy without gangrene: Secondary | ICD-10-CM | POA: Diagnosis not present

## 2019-03-06 DIAGNOSIS — N186 End stage renal disease: Secondary | ICD-10-CM | POA: Diagnosis not present

## 2019-03-06 DIAGNOSIS — I132 Hypertensive heart and chronic kidney disease with heart failure and with stage 5 chronic kidney disease, or end stage renal disease: Secondary | ICD-10-CM | POA: Diagnosis not present

## 2019-03-06 DIAGNOSIS — E1122 Type 2 diabetes mellitus with diabetic chronic kidney disease: Secondary | ICD-10-CM | POA: Diagnosis not present

## 2019-03-06 DIAGNOSIS — I509 Heart failure, unspecified: Secondary | ICD-10-CM | POA: Diagnosis not present

## 2019-03-07 DIAGNOSIS — Z992 Dependence on renal dialysis: Secondary | ICD-10-CM | POA: Diagnosis not present

## 2019-03-07 DIAGNOSIS — E119 Type 2 diabetes mellitus without complications: Secondary | ICD-10-CM | POA: Diagnosis not present

## 2019-03-07 DIAGNOSIS — N2581 Secondary hyperparathyroidism of renal origin: Secondary | ICD-10-CM | POA: Diagnosis not present

## 2019-03-07 DIAGNOSIS — N186 End stage renal disease: Secondary | ICD-10-CM | POA: Diagnosis not present

## 2019-03-09 DIAGNOSIS — E1151 Type 2 diabetes mellitus with diabetic peripheral angiopathy without gangrene: Secondary | ICD-10-CM | POA: Diagnosis not present

## 2019-03-09 DIAGNOSIS — N186 End stage renal disease: Secondary | ICD-10-CM | POA: Diagnosis not present

## 2019-03-09 DIAGNOSIS — I69354 Hemiplegia and hemiparesis following cerebral infarction affecting left non-dominant side: Secondary | ICD-10-CM | POA: Diagnosis not present

## 2019-03-09 DIAGNOSIS — E119 Type 2 diabetes mellitus without complications: Secondary | ICD-10-CM | POA: Diagnosis not present

## 2019-03-09 DIAGNOSIS — E1122 Type 2 diabetes mellitus with diabetic chronic kidney disease: Secondary | ICD-10-CM | POA: Diagnosis not present

## 2019-03-09 DIAGNOSIS — I509 Heart failure, unspecified: Secondary | ICD-10-CM | POA: Diagnosis not present

## 2019-03-09 DIAGNOSIS — Z992 Dependence on renal dialysis: Secondary | ICD-10-CM | POA: Diagnosis not present

## 2019-03-09 DIAGNOSIS — I132 Hypertensive heart and chronic kidney disease with heart failure and with stage 5 chronic kidney disease, or end stage renal disease: Secondary | ICD-10-CM | POA: Diagnosis not present

## 2019-03-09 DIAGNOSIS — T8612 Kidney transplant failure: Secondary | ICD-10-CM | POA: Diagnosis not present

## 2019-03-09 DIAGNOSIS — N2581 Secondary hyperparathyroidism of renal origin: Secondary | ICD-10-CM | POA: Diagnosis not present

## 2019-03-10 DIAGNOSIS — E119 Type 2 diabetes mellitus without complications: Secondary | ICD-10-CM | POA: Diagnosis not present

## 2019-03-10 DIAGNOSIS — Z992 Dependence on renal dialysis: Secondary | ICD-10-CM | POA: Diagnosis not present

## 2019-03-10 DIAGNOSIS — N186 End stage renal disease: Secondary | ICD-10-CM | POA: Diagnosis not present

## 2019-03-10 DIAGNOSIS — N2581 Secondary hyperparathyroidism of renal origin: Secondary | ICD-10-CM | POA: Diagnosis not present

## 2019-03-12 DIAGNOSIS — Z992 Dependence on renal dialysis: Secondary | ICD-10-CM | POA: Diagnosis not present

## 2019-03-12 DIAGNOSIS — N186 End stage renal disease: Secondary | ICD-10-CM | POA: Diagnosis not present

## 2019-03-12 DIAGNOSIS — N2581 Secondary hyperparathyroidism of renal origin: Secondary | ICD-10-CM | POA: Diagnosis not present

## 2019-03-12 DIAGNOSIS — E119 Type 2 diabetes mellitus without complications: Secondary | ICD-10-CM | POA: Diagnosis not present

## 2019-03-14 DIAGNOSIS — Z992 Dependence on renal dialysis: Secondary | ICD-10-CM | POA: Diagnosis not present

## 2019-03-14 DIAGNOSIS — N2581 Secondary hyperparathyroidism of renal origin: Secondary | ICD-10-CM | POA: Diagnosis not present

## 2019-03-14 DIAGNOSIS — E119 Type 2 diabetes mellitus without complications: Secondary | ICD-10-CM | POA: Diagnosis not present

## 2019-03-14 DIAGNOSIS — N186 End stage renal disease: Secondary | ICD-10-CM | POA: Diagnosis not present

## 2019-03-16 DIAGNOSIS — I69354 Hemiplegia and hemiparesis following cerebral infarction affecting left non-dominant side: Secondary | ICD-10-CM | POA: Diagnosis not present

## 2019-03-16 DIAGNOSIS — E1122 Type 2 diabetes mellitus with diabetic chronic kidney disease: Secondary | ICD-10-CM | POA: Diagnosis not present

## 2019-03-16 DIAGNOSIS — E1151 Type 2 diabetes mellitus with diabetic peripheral angiopathy without gangrene: Secondary | ICD-10-CM | POA: Diagnosis not present

## 2019-03-16 DIAGNOSIS — I132 Hypertensive heart and chronic kidney disease with heart failure and with stage 5 chronic kidney disease, or end stage renal disease: Secondary | ICD-10-CM | POA: Diagnosis not present

## 2019-03-16 DIAGNOSIS — N186 End stage renal disease: Secondary | ICD-10-CM | POA: Diagnosis not present

## 2019-03-16 DIAGNOSIS — I509 Heart failure, unspecified: Secondary | ICD-10-CM | POA: Diagnosis not present

## 2019-03-17 DIAGNOSIS — E119 Type 2 diabetes mellitus without complications: Secondary | ICD-10-CM | POA: Diagnosis not present

## 2019-03-17 DIAGNOSIS — N186 End stage renal disease: Secondary | ICD-10-CM | POA: Diagnosis not present

## 2019-03-17 DIAGNOSIS — Z992 Dependence on renal dialysis: Secondary | ICD-10-CM | POA: Diagnosis not present

## 2019-03-17 DIAGNOSIS — N2581 Secondary hyperparathyroidism of renal origin: Secondary | ICD-10-CM | POA: Diagnosis not present

## 2019-03-18 DIAGNOSIS — I509 Heart failure, unspecified: Secondary | ICD-10-CM | POA: Diagnosis not present

## 2019-03-18 DIAGNOSIS — E1122 Type 2 diabetes mellitus with diabetic chronic kidney disease: Secondary | ICD-10-CM | POA: Diagnosis not present

## 2019-03-18 DIAGNOSIS — N186 End stage renal disease: Secondary | ICD-10-CM | POA: Diagnosis not present

## 2019-03-18 DIAGNOSIS — I69354 Hemiplegia and hemiparesis following cerebral infarction affecting left non-dominant side: Secondary | ICD-10-CM | POA: Diagnosis not present

## 2019-03-18 DIAGNOSIS — I132 Hypertensive heart and chronic kidney disease with heart failure and with stage 5 chronic kidney disease, or end stage renal disease: Secondary | ICD-10-CM | POA: Diagnosis not present

## 2019-03-18 DIAGNOSIS — E1151 Type 2 diabetes mellitus with diabetic peripheral angiopathy without gangrene: Secondary | ICD-10-CM | POA: Diagnosis not present

## 2019-03-19 DIAGNOSIS — E119 Type 2 diabetes mellitus without complications: Secondary | ICD-10-CM | POA: Diagnosis not present

## 2019-03-19 DIAGNOSIS — N186 End stage renal disease: Secondary | ICD-10-CM | POA: Diagnosis not present

## 2019-03-19 DIAGNOSIS — Z992 Dependence on renal dialysis: Secondary | ICD-10-CM | POA: Diagnosis not present

## 2019-03-19 DIAGNOSIS — N2581 Secondary hyperparathyroidism of renal origin: Secondary | ICD-10-CM | POA: Diagnosis not present

## 2019-03-21 DIAGNOSIS — Z992 Dependence on renal dialysis: Secondary | ICD-10-CM | POA: Diagnosis not present

## 2019-03-21 DIAGNOSIS — E119 Type 2 diabetes mellitus without complications: Secondary | ICD-10-CM | POA: Diagnosis not present

## 2019-03-21 DIAGNOSIS — N2581 Secondary hyperparathyroidism of renal origin: Secondary | ICD-10-CM | POA: Diagnosis not present

## 2019-03-21 DIAGNOSIS — N186 End stage renal disease: Secondary | ICD-10-CM | POA: Diagnosis not present

## 2019-03-24 DIAGNOSIS — Z992 Dependence on renal dialysis: Secondary | ICD-10-CM | POA: Diagnosis not present

## 2019-03-24 DIAGNOSIS — E119 Type 2 diabetes mellitus without complications: Secondary | ICD-10-CM | POA: Diagnosis not present

## 2019-03-24 DIAGNOSIS — N2581 Secondary hyperparathyroidism of renal origin: Secondary | ICD-10-CM | POA: Diagnosis not present

## 2019-03-24 DIAGNOSIS — N186 End stage renal disease: Secondary | ICD-10-CM | POA: Diagnosis not present

## 2019-03-26 DIAGNOSIS — Z992 Dependence on renal dialysis: Secondary | ICD-10-CM | POA: Diagnosis not present

## 2019-03-26 DIAGNOSIS — N186 End stage renal disease: Secondary | ICD-10-CM | POA: Diagnosis not present

## 2019-03-26 DIAGNOSIS — N2581 Secondary hyperparathyroidism of renal origin: Secondary | ICD-10-CM | POA: Diagnosis not present

## 2019-03-26 DIAGNOSIS — E119 Type 2 diabetes mellitus without complications: Secondary | ICD-10-CM | POA: Diagnosis not present

## 2019-03-28 DIAGNOSIS — N2581 Secondary hyperparathyroidism of renal origin: Secondary | ICD-10-CM | POA: Diagnosis not present

## 2019-03-28 DIAGNOSIS — E119 Type 2 diabetes mellitus without complications: Secondary | ICD-10-CM | POA: Diagnosis not present

## 2019-03-28 DIAGNOSIS — N186 End stage renal disease: Secondary | ICD-10-CM | POA: Diagnosis not present

## 2019-03-28 DIAGNOSIS — Z992 Dependence on renal dialysis: Secondary | ICD-10-CM | POA: Diagnosis not present

## 2019-03-31 DIAGNOSIS — N186 End stage renal disease: Secondary | ICD-10-CM | POA: Diagnosis not present

## 2019-03-31 DIAGNOSIS — E119 Type 2 diabetes mellitus without complications: Secondary | ICD-10-CM | POA: Diagnosis not present

## 2019-03-31 DIAGNOSIS — N2581 Secondary hyperparathyroidism of renal origin: Secondary | ICD-10-CM | POA: Diagnosis not present

## 2019-03-31 DIAGNOSIS — Z992 Dependence on renal dialysis: Secondary | ICD-10-CM | POA: Diagnosis not present

## 2019-04-02 DIAGNOSIS — N2581 Secondary hyperparathyroidism of renal origin: Secondary | ICD-10-CM | POA: Diagnosis not present

## 2019-04-02 DIAGNOSIS — E119 Type 2 diabetes mellitus without complications: Secondary | ICD-10-CM | POA: Diagnosis not present

## 2019-04-02 DIAGNOSIS — N186 End stage renal disease: Secondary | ICD-10-CM | POA: Diagnosis not present

## 2019-04-02 DIAGNOSIS — Z992 Dependence on renal dialysis: Secondary | ICD-10-CM | POA: Diagnosis not present

## 2019-04-04 DIAGNOSIS — Z992 Dependence on renal dialysis: Secondary | ICD-10-CM | POA: Diagnosis not present

## 2019-04-04 DIAGNOSIS — E119 Type 2 diabetes mellitus without complications: Secondary | ICD-10-CM | POA: Diagnosis not present

## 2019-04-04 DIAGNOSIS — N186 End stage renal disease: Secondary | ICD-10-CM | POA: Diagnosis not present

## 2019-04-04 DIAGNOSIS — N2581 Secondary hyperparathyroidism of renal origin: Secondary | ICD-10-CM | POA: Diagnosis not present

## 2019-04-07 DIAGNOSIS — E119 Type 2 diabetes mellitus without complications: Secondary | ICD-10-CM | POA: Diagnosis not present

## 2019-04-07 DIAGNOSIS — N186 End stage renal disease: Secondary | ICD-10-CM | POA: Diagnosis not present

## 2019-04-07 DIAGNOSIS — Z992 Dependence on renal dialysis: Secondary | ICD-10-CM | POA: Diagnosis not present

## 2019-04-07 DIAGNOSIS — N2581 Secondary hyperparathyroidism of renal origin: Secondary | ICD-10-CM | POA: Diagnosis not present

## 2019-04-09 DIAGNOSIS — E119 Type 2 diabetes mellitus without complications: Secondary | ICD-10-CM | POA: Diagnosis not present

## 2019-04-09 DIAGNOSIS — T8612 Kidney transplant failure: Secondary | ICD-10-CM | POA: Diagnosis not present

## 2019-04-09 DIAGNOSIS — Z992 Dependence on renal dialysis: Secondary | ICD-10-CM | POA: Diagnosis not present

## 2019-04-09 DIAGNOSIS — N186 End stage renal disease: Secondary | ICD-10-CM | POA: Diagnosis not present

## 2019-04-09 DIAGNOSIS — N2581 Secondary hyperparathyroidism of renal origin: Secondary | ICD-10-CM | POA: Diagnosis not present

## 2019-04-11 DIAGNOSIS — N186 End stage renal disease: Secondary | ICD-10-CM | POA: Diagnosis not present

## 2019-04-11 DIAGNOSIS — Z992 Dependence on renal dialysis: Secondary | ICD-10-CM | POA: Diagnosis not present

## 2019-04-11 DIAGNOSIS — N2581 Secondary hyperparathyroidism of renal origin: Secondary | ICD-10-CM | POA: Diagnosis not present

## 2019-04-11 DIAGNOSIS — E119 Type 2 diabetes mellitus without complications: Secondary | ICD-10-CM | POA: Diagnosis not present

## 2019-04-14 DIAGNOSIS — N2581 Secondary hyperparathyroidism of renal origin: Secondary | ICD-10-CM | POA: Diagnosis not present

## 2019-04-14 DIAGNOSIS — N186 End stage renal disease: Secondary | ICD-10-CM | POA: Diagnosis not present

## 2019-04-14 DIAGNOSIS — E119 Type 2 diabetes mellitus without complications: Secondary | ICD-10-CM | POA: Diagnosis not present

## 2019-04-14 DIAGNOSIS — Z992 Dependence on renal dialysis: Secondary | ICD-10-CM | POA: Diagnosis not present

## 2019-04-16 DIAGNOSIS — N2581 Secondary hyperparathyroidism of renal origin: Secondary | ICD-10-CM | POA: Diagnosis not present

## 2019-04-16 DIAGNOSIS — E119 Type 2 diabetes mellitus without complications: Secondary | ICD-10-CM | POA: Diagnosis not present

## 2019-04-16 DIAGNOSIS — N186 End stage renal disease: Secondary | ICD-10-CM | POA: Diagnosis not present

## 2019-04-16 DIAGNOSIS — Z992 Dependence on renal dialysis: Secondary | ICD-10-CM | POA: Diagnosis not present

## 2019-04-18 DIAGNOSIS — N2581 Secondary hyperparathyroidism of renal origin: Secondary | ICD-10-CM | POA: Diagnosis not present

## 2019-04-18 DIAGNOSIS — N186 End stage renal disease: Secondary | ICD-10-CM | POA: Diagnosis not present

## 2019-04-18 DIAGNOSIS — Z992 Dependence on renal dialysis: Secondary | ICD-10-CM | POA: Diagnosis not present

## 2019-04-18 DIAGNOSIS — E119 Type 2 diabetes mellitus without complications: Secondary | ICD-10-CM | POA: Diagnosis not present

## 2019-04-21 DIAGNOSIS — N2581 Secondary hyperparathyroidism of renal origin: Secondary | ICD-10-CM | POA: Diagnosis not present

## 2019-04-21 DIAGNOSIS — Z992 Dependence on renal dialysis: Secondary | ICD-10-CM | POA: Diagnosis not present

## 2019-04-21 DIAGNOSIS — N186 End stage renal disease: Secondary | ICD-10-CM | POA: Diagnosis not present

## 2019-04-21 DIAGNOSIS — E119 Type 2 diabetes mellitus without complications: Secondary | ICD-10-CM | POA: Diagnosis not present

## 2019-04-23 DIAGNOSIS — Z992 Dependence on renal dialysis: Secondary | ICD-10-CM | POA: Diagnosis not present

## 2019-04-23 DIAGNOSIS — N186 End stage renal disease: Secondary | ICD-10-CM | POA: Diagnosis not present

## 2019-04-23 DIAGNOSIS — E119 Type 2 diabetes mellitus without complications: Secondary | ICD-10-CM | POA: Diagnosis not present

## 2019-04-23 DIAGNOSIS — N2581 Secondary hyperparathyroidism of renal origin: Secondary | ICD-10-CM | POA: Diagnosis not present

## 2019-04-25 DIAGNOSIS — E119 Type 2 diabetes mellitus without complications: Secondary | ICD-10-CM | POA: Diagnosis not present

## 2019-04-25 DIAGNOSIS — N2581 Secondary hyperparathyroidism of renal origin: Secondary | ICD-10-CM | POA: Diagnosis not present

## 2019-04-25 DIAGNOSIS — N186 End stage renal disease: Secondary | ICD-10-CM | POA: Diagnosis not present

## 2019-04-25 DIAGNOSIS — Z992 Dependence on renal dialysis: Secondary | ICD-10-CM | POA: Diagnosis not present

## 2019-04-28 DIAGNOSIS — E119 Type 2 diabetes mellitus without complications: Secondary | ICD-10-CM | POA: Diagnosis not present

## 2019-04-28 DIAGNOSIS — N2581 Secondary hyperparathyroidism of renal origin: Secondary | ICD-10-CM | POA: Diagnosis not present

## 2019-04-28 DIAGNOSIS — N186 End stage renal disease: Secondary | ICD-10-CM | POA: Diagnosis not present

## 2019-04-28 DIAGNOSIS — Z992 Dependence on renal dialysis: Secondary | ICD-10-CM | POA: Diagnosis not present

## 2019-04-30 DIAGNOSIS — Z992 Dependence on renal dialysis: Secondary | ICD-10-CM | POA: Diagnosis not present

## 2019-04-30 DIAGNOSIS — N186 End stage renal disease: Secondary | ICD-10-CM | POA: Diagnosis not present

## 2019-04-30 DIAGNOSIS — E119 Type 2 diabetes mellitus without complications: Secondary | ICD-10-CM | POA: Diagnosis not present

## 2019-04-30 DIAGNOSIS — N2581 Secondary hyperparathyroidism of renal origin: Secondary | ICD-10-CM | POA: Diagnosis not present

## 2019-05-02 DIAGNOSIS — E119 Type 2 diabetes mellitus without complications: Secondary | ICD-10-CM | POA: Diagnosis not present

## 2019-05-02 DIAGNOSIS — N186 End stage renal disease: Secondary | ICD-10-CM | POA: Diagnosis not present

## 2019-05-02 DIAGNOSIS — N2581 Secondary hyperparathyroidism of renal origin: Secondary | ICD-10-CM | POA: Diagnosis not present

## 2019-05-02 DIAGNOSIS — Z992 Dependence on renal dialysis: Secondary | ICD-10-CM | POA: Diagnosis not present

## 2019-05-05 DIAGNOSIS — N186 End stage renal disease: Secondary | ICD-10-CM | POA: Diagnosis not present

## 2019-05-05 DIAGNOSIS — Z992 Dependence on renal dialysis: Secondary | ICD-10-CM | POA: Diagnosis not present

## 2019-05-05 DIAGNOSIS — E119 Type 2 diabetes mellitus without complications: Secondary | ICD-10-CM | POA: Diagnosis not present

## 2019-05-05 DIAGNOSIS — N2581 Secondary hyperparathyroidism of renal origin: Secondary | ICD-10-CM | POA: Diagnosis not present

## 2019-05-07 DIAGNOSIS — N186 End stage renal disease: Secondary | ICD-10-CM | POA: Diagnosis not present

## 2019-05-07 DIAGNOSIS — Z992 Dependence on renal dialysis: Secondary | ICD-10-CM | POA: Diagnosis not present

## 2019-05-07 DIAGNOSIS — E119 Type 2 diabetes mellitus without complications: Secondary | ICD-10-CM | POA: Diagnosis not present

## 2019-05-07 DIAGNOSIS — N2581 Secondary hyperparathyroidism of renal origin: Secondary | ICD-10-CM | POA: Diagnosis not present

## 2019-05-09 DIAGNOSIS — N2581 Secondary hyperparathyroidism of renal origin: Secondary | ICD-10-CM | POA: Diagnosis not present

## 2019-05-09 DIAGNOSIS — Z992 Dependence on renal dialysis: Secondary | ICD-10-CM | POA: Diagnosis not present

## 2019-05-09 DIAGNOSIS — T8612 Kidney transplant failure: Secondary | ICD-10-CM | POA: Diagnosis not present

## 2019-05-09 DIAGNOSIS — N186 End stage renal disease: Secondary | ICD-10-CM | POA: Diagnosis not present

## 2019-05-09 DIAGNOSIS — E119 Type 2 diabetes mellitus without complications: Secondary | ICD-10-CM | POA: Diagnosis not present

## 2019-05-12 DIAGNOSIS — N2581 Secondary hyperparathyroidism of renal origin: Secondary | ICD-10-CM | POA: Diagnosis not present

## 2019-05-12 DIAGNOSIS — Z992 Dependence on renal dialysis: Secondary | ICD-10-CM | POA: Diagnosis not present

## 2019-05-12 DIAGNOSIS — E119 Type 2 diabetes mellitus without complications: Secondary | ICD-10-CM | POA: Diagnosis not present

## 2019-05-12 DIAGNOSIS — N186 End stage renal disease: Secondary | ICD-10-CM | POA: Diagnosis not present

## 2019-05-14 DIAGNOSIS — Z992 Dependence on renal dialysis: Secondary | ICD-10-CM | POA: Diagnosis not present

## 2019-05-14 DIAGNOSIS — E119 Type 2 diabetes mellitus without complications: Secondary | ICD-10-CM | POA: Diagnosis not present

## 2019-05-14 DIAGNOSIS — N2581 Secondary hyperparathyroidism of renal origin: Secondary | ICD-10-CM | POA: Diagnosis not present

## 2019-05-14 DIAGNOSIS — N186 End stage renal disease: Secondary | ICD-10-CM | POA: Diagnosis not present

## 2019-05-16 DIAGNOSIS — Z992 Dependence on renal dialysis: Secondary | ICD-10-CM | POA: Diagnosis not present

## 2019-05-16 DIAGNOSIS — E119 Type 2 diabetes mellitus without complications: Secondary | ICD-10-CM | POA: Diagnosis not present

## 2019-05-16 DIAGNOSIS — N186 End stage renal disease: Secondary | ICD-10-CM | POA: Diagnosis not present

## 2019-05-16 DIAGNOSIS — N2581 Secondary hyperparathyroidism of renal origin: Secondary | ICD-10-CM | POA: Diagnosis not present

## 2019-05-19 DIAGNOSIS — Z992 Dependence on renal dialysis: Secondary | ICD-10-CM | POA: Diagnosis not present

## 2019-05-19 DIAGNOSIS — N186 End stage renal disease: Secondary | ICD-10-CM | POA: Diagnosis not present

## 2019-05-19 DIAGNOSIS — E119 Type 2 diabetes mellitus without complications: Secondary | ICD-10-CM | POA: Diagnosis not present

## 2019-05-19 DIAGNOSIS — N2581 Secondary hyperparathyroidism of renal origin: Secondary | ICD-10-CM | POA: Diagnosis not present

## 2019-05-21 DIAGNOSIS — E119 Type 2 diabetes mellitus without complications: Secondary | ICD-10-CM | POA: Diagnosis not present

## 2019-05-21 DIAGNOSIS — N186 End stage renal disease: Secondary | ICD-10-CM | POA: Diagnosis not present

## 2019-05-21 DIAGNOSIS — Z992 Dependence on renal dialysis: Secondary | ICD-10-CM | POA: Diagnosis not present

## 2019-05-21 DIAGNOSIS — N2581 Secondary hyperparathyroidism of renal origin: Secondary | ICD-10-CM | POA: Diagnosis not present

## 2019-05-23 DIAGNOSIS — E119 Type 2 diabetes mellitus without complications: Secondary | ICD-10-CM | POA: Diagnosis not present

## 2019-05-23 DIAGNOSIS — N186 End stage renal disease: Secondary | ICD-10-CM | POA: Diagnosis not present

## 2019-05-23 DIAGNOSIS — Z992 Dependence on renal dialysis: Secondary | ICD-10-CM | POA: Diagnosis not present

## 2019-05-23 DIAGNOSIS — N2581 Secondary hyperparathyroidism of renal origin: Secondary | ICD-10-CM | POA: Diagnosis not present

## 2019-05-26 DIAGNOSIS — N2581 Secondary hyperparathyroidism of renal origin: Secondary | ICD-10-CM | POA: Diagnosis not present

## 2019-05-26 DIAGNOSIS — N186 End stage renal disease: Secondary | ICD-10-CM | POA: Diagnosis not present

## 2019-05-26 DIAGNOSIS — E119 Type 2 diabetes mellitus without complications: Secondary | ICD-10-CM | POA: Diagnosis not present

## 2019-05-26 DIAGNOSIS — Z992 Dependence on renal dialysis: Secondary | ICD-10-CM | POA: Diagnosis not present

## 2019-05-28 DIAGNOSIS — E119 Type 2 diabetes mellitus without complications: Secondary | ICD-10-CM | POA: Diagnosis not present

## 2019-05-28 DIAGNOSIS — N2581 Secondary hyperparathyroidism of renal origin: Secondary | ICD-10-CM | POA: Diagnosis not present

## 2019-05-28 DIAGNOSIS — Z992 Dependence on renal dialysis: Secondary | ICD-10-CM | POA: Diagnosis not present

## 2019-05-28 DIAGNOSIS — N186 End stage renal disease: Secondary | ICD-10-CM | POA: Diagnosis not present

## 2019-05-30 DIAGNOSIS — E119 Type 2 diabetes mellitus without complications: Secondary | ICD-10-CM | POA: Diagnosis not present

## 2019-05-30 DIAGNOSIS — N186 End stage renal disease: Secondary | ICD-10-CM | POA: Diagnosis not present

## 2019-05-30 DIAGNOSIS — N2581 Secondary hyperparathyroidism of renal origin: Secondary | ICD-10-CM | POA: Diagnosis not present

## 2019-05-30 DIAGNOSIS — Z992 Dependence on renal dialysis: Secondary | ICD-10-CM | POA: Diagnosis not present

## 2019-06-02 DIAGNOSIS — E119 Type 2 diabetes mellitus without complications: Secondary | ICD-10-CM | POA: Diagnosis not present

## 2019-06-02 DIAGNOSIS — N186 End stage renal disease: Secondary | ICD-10-CM | POA: Diagnosis not present

## 2019-06-02 DIAGNOSIS — N2581 Secondary hyperparathyroidism of renal origin: Secondary | ICD-10-CM | POA: Diagnosis not present

## 2019-06-02 DIAGNOSIS — Z992 Dependence on renal dialysis: Secondary | ICD-10-CM | POA: Diagnosis not present

## 2019-06-04 DIAGNOSIS — Z992 Dependence on renal dialysis: Secondary | ICD-10-CM | POA: Diagnosis not present

## 2019-06-04 DIAGNOSIS — N186 End stage renal disease: Secondary | ICD-10-CM | POA: Diagnosis not present

## 2019-06-04 DIAGNOSIS — N2581 Secondary hyperparathyroidism of renal origin: Secondary | ICD-10-CM | POA: Diagnosis not present

## 2019-06-04 DIAGNOSIS — E119 Type 2 diabetes mellitus without complications: Secondary | ICD-10-CM | POA: Diagnosis not present

## 2019-06-06 DIAGNOSIS — N186 End stage renal disease: Secondary | ICD-10-CM | POA: Diagnosis not present

## 2019-06-06 DIAGNOSIS — E119 Type 2 diabetes mellitus without complications: Secondary | ICD-10-CM | POA: Diagnosis not present

## 2019-06-06 DIAGNOSIS — Z992 Dependence on renal dialysis: Secondary | ICD-10-CM | POA: Diagnosis not present

## 2019-06-06 DIAGNOSIS — N2581 Secondary hyperparathyroidism of renal origin: Secondary | ICD-10-CM | POA: Diagnosis not present

## 2019-07-09 DIAGNOSIS — T8612 Kidney transplant failure: Secondary | ICD-10-CM | POA: Diagnosis not present

## 2019-07-09 DIAGNOSIS — N186 End stage renal disease: Secondary | ICD-10-CM | POA: Diagnosis not present

## 2019-07-09 DIAGNOSIS — E119 Type 2 diabetes mellitus without complications: Secondary | ICD-10-CM | POA: Diagnosis not present

## 2019-07-09 DIAGNOSIS — N2581 Secondary hyperparathyroidism of renal origin: Secondary | ICD-10-CM | POA: Diagnosis not present

## 2019-07-09 DIAGNOSIS — Z992 Dependence on renal dialysis: Secondary | ICD-10-CM | POA: Diagnosis not present

## 2019-07-11 DIAGNOSIS — Z992 Dependence on renal dialysis: Secondary | ICD-10-CM | POA: Diagnosis not present

## 2019-07-11 DIAGNOSIS — N186 End stage renal disease: Secondary | ICD-10-CM | POA: Diagnosis not present

## 2019-07-11 DIAGNOSIS — N2581 Secondary hyperparathyroidism of renal origin: Secondary | ICD-10-CM | POA: Diagnosis not present

## 2019-07-11 DIAGNOSIS — E119 Type 2 diabetes mellitus without complications: Secondary | ICD-10-CM | POA: Diagnosis not present

## 2019-07-14 DIAGNOSIS — Z992 Dependence on renal dialysis: Secondary | ICD-10-CM | POA: Diagnosis not present

## 2019-07-14 DIAGNOSIS — E119 Type 2 diabetes mellitus without complications: Secondary | ICD-10-CM | POA: Diagnosis not present

## 2019-07-14 DIAGNOSIS — N186 End stage renal disease: Secondary | ICD-10-CM | POA: Diagnosis not present

## 2019-07-14 DIAGNOSIS — N2581 Secondary hyperparathyroidism of renal origin: Secondary | ICD-10-CM | POA: Diagnosis not present

## 2019-07-16 DIAGNOSIS — Z992 Dependence on renal dialysis: Secondary | ICD-10-CM | POA: Diagnosis not present

## 2019-07-16 DIAGNOSIS — N2581 Secondary hyperparathyroidism of renal origin: Secondary | ICD-10-CM | POA: Diagnosis not present

## 2019-07-16 DIAGNOSIS — E119 Type 2 diabetes mellitus without complications: Secondary | ICD-10-CM | POA: Diagnosis not present

## 2019-07-16 DIAGNOSIS — N186 End stage renal disease: Secondary | ICD-10-CM | POA: Diagnosis not present

## 2019-07-18 DIAGNOSIS — N2581 Secondary hyperparathyroidism of renal origin: Secondary | ICD-10-CM | POA: Diagnosis not present

## 2019-07-18 DIAGNOSIS — E119 Type 2 diabetes mellitus without complications: Secondary | ICD-10-CM | POA: Diagnosis not present

## 2019-07-18 DIAGNOSIS — N186 End stage renal disease: Secondary | ICD-10-CM | POA: Diagnosis not present

## 2019-07-18 DIAGNOSIS — Z992 Dependence on renal dialysis: Secondary | ICD-10-CM | POA: Diagnosis not present

## 2019-07-21 DIAGNOSIS — E119 Type 2 diabetes mellitus without complications: Secondary | ICD-10-CM | POA: Diagnosis not present

## 2019-07-21 DIAGNOSIS — N2581 Secondary hyperparathyroidism of renal origin: Secondary | ICD-10-CM | POA: Diagnosis not present

## 2019-07-21 DIAGNOSIS — Z992 Dependence on renal dialysis: Secondary | ICD-10-CM | POA: Diagnosis not present

## 2019-07-21 DIAGNOSIS — N186 End stage renal disease: Secondary | ICD-10-CM | POA: Diagnosis not present

## 2019-07-23 DIAGNOSIS — N186 End stage renal disease: Secondary | ICD-10-CM | POA: Diagnosis not present

## 2019-07-23 DIAGNOSIS — E119 Type 2 diabetes mellitus without complications: Secondary | ICD-10-CM | POA: Diagnosis not present

## 2019-07-23 DIAGNOSIS — N2581 Secondary hyperparathyroidism of renal origin: Secondary | ICD-10-CM | POA: Diagnosis not present

## 2019-07-23 DIAGNOSIS — Z992 Dependence on renal dialysis: Secondary | ICD-10-CM | POA: Diagnosis not present

## 2019-07-25 DIAGNOSIS — Z992 Dependence on renal dialysis: Secondary | ICD-10-CM | POA: Diagnosis not present

## 2019-07-25 DIAGNOSIS — N186 End stage renal disease: Secondary | ICD-10-CM | POA: Diagnosis not present

## 2019-07-25 DIAGNOSIS — N2581 Secondary hyperparathyroidism of renal origin: Secondary | ICD-10-CM | POA: Diagnosis not present

## 2019-07-25 DIAGNOSIS — E119 Type 2 diabetes mellitus without complications: Secondary | ICD-10-CM | POA: Diagnosis not present

## 2019-07-28 DIAGNOSIS — N2581 Secondary hyperparathyroidism of renal origin: Secondary | ICD-10-CM | POA: Diagnosis not present

## 2019-07-28 DIAGNOSIS — E119 Type 2 diabetes mellitus without complications: Secondary | ICD-10-CM | POA: Diagnosis not present

## 2019-07-28 DIAGNOSIS — Z992 Dependence on renal dialysis: Secondary | ICD-10-CM | POA: Diagnosis not present

## 2019-07-28 DIAGNOSIS — N186 End stage renal disease: Secondary | ICD-10-CM | POA: Diagnosis not present

## 2019-07-30 DIAGNOSIS — Z992 Dependence on renal dialysis: Secondary | ICD-10-CM | POA: Diagnosis not present

## 2019-07-30 DIAGNOSIS — E119 Type 2 diabetes mellitus without complications: Secondary | ICD-10-CM | POA: Diagnosis not present

## 2019-07-30 DIAGNOSIS — N186 End stage renal disease: Secondary | ICD-10-CM | POA: Diagnosis not present

## 2019-07-30 DIAGNOSIS — N2581 Secondary hyperparathyroidism of renal origin: Secondary | ICD-10-CM | POA: Diagnosis not present

## 2019-08-01 DIAGNOSIS — Z992 Dependence on renal dialysis: Secondary | ICD-10-CM | POA: Diagnosis not present

## 2019-08-01 DIAGNOSIS — E119 Type 2 diabetes mellitus without complications: Secondary | ICD-10-CM | POA: Diagnosis not present

## 2019-08-01 DIAGNOSIS — N2581 Secondary hyperparathyroidism of renal origin: Secondary | ICD-10-CM | POA: Diagnosis not present

## 2019-08-01 DIAGNOSIS — N186 End stage renal disease: Secondary | ICD-10-CM | POA: Diagnosis not present

## 2019-08-04 DIAGNOSIS — N186 End stage renal disease: Secondary | ICD-10-CM | POA: Diagnosis not present

## 2019-08-04 DIAGNOSIS — Z992 Dependence on renal dialysis: Secondary | ICD-10-CM | POA: Diagnosis not present

## 2019-08-04 DIAGNOSIS — N2581 Secondary hyperparathyroidism of renal origin: Secondary | ICD-10-CM | POA: Diagnosis not present

## 2019-08-04 DIAGNOSIS — E119 Type 2 diabetes mellitus without complications: Secondary | ICD-10-CM | POA: Diagnosis not present

## 2019-08-06 DIAGNOSIS — Z8249 Family history of ischemic heart disease and other diseases of the circulatory system: Secondary | ICD-10-CM | POA: Diagnosis not present

## 2019-08-06 DIAGNOSIS — E119 Type 2 diabetes mellitus without complications: Secondary | ICD-10-CM | POA: Diagnosis not present

## 2019-08-06 DIAGNOSIS — I10 Essential (primary) hypertension: Secondary | ICD-10-CM | POA: Diagnosis not present

## 2019-08-06 DIAGNOSIS — N186 End stage renal disease: Secondary | ICD-10-CM | POA: Diagnosis not present

## 2019-08-06 DIAGNOSIS — Z992 Dependence on renal dialysis: Secondary | ICD-10-CM | POA: Diagnosis not present

## 2019-08-06 DIAGNOSIS — N2581 Secondary hyperparathyroidism of renal origin: Secondary | ICD-10-CM | POA: Diagnosis not present

## 2019-08-08 DIAGNOSIS — N186 End stage renal disease: Secondary | ICD-10-CM | POA: Diagnosis not present

## 2019-08-08 DIAGNOSIS — Z992 Dependence on renal dialysis: Secondary | ICD-10-CM | POA: Diagnosis not present

## 2019-08-08 DIAGNOSIS — N2581 Secondary hyperparathyroidism of renal origin: Secondary | ICD-10-CM | POA: Diagnosis not present

## 2019-08-08 DIAGNOSIS — E119 Type 2 diabetes mellitus without complications: Secondary | ICD-10-CM | POA: Diagnosis not present

## 2019-08-09 DIAGNOSIS — N186 End stage renal disease: Secondary | ICD-10-CM | POA: Diagnosis not present

## 2019-08-09 DIAGNOSIS — Z992 Dependence on renal dialysis: Secondary | ICD-10-CM | POA: Diagnosis not present

## 2019-08-09 DIAGNOSIS — T8612 Kidney transplant failure: Secondary | ICD-10-CM | POA: Diagnosis not present

## 2019-08-11 DIAGNOSIS — N186 End stage renal disease: Secondary | ICD-10-CM | POA: Diagnosis not present

## 2019-08-11 DIAGNOSIS — Z992 Dependence on renal dialysis: Secondary | ICD-10-CM | POA: Diagnosis not present

## 2019-08-11 DIAGNOSIS — N2581 Secondary hyperparathyroidism of renal origin: Secondary | ICD-10-CM | POA: Diagnosis not present

## 2019-08-11 DIAGNOSIS — E119 Type 2 diabetes mellitus without complications: Secondary | ICD-10-CM | POA: Diagnosis not present

## 2019-08-13 DIAGNOSIS — I639 Cerebral infarction, unspecified: Secondary | ICD-10-CM | POA: Diagnosis not present

## 2019-08-13 DIAGNOSIS — Z992 Dependence on renal dialysis: Secondary | ICD-10-CM | POA: Diagnosis not present

## 2019-08-13 DIAGNOSIS — I10 Essential (primary) hypertension: Secondary | ICD-10-CM | POA: Diagnosis not present

## 2019-08-13 DIAGNOSIS — N2581 Secondary hyperparathyroidism of renal origin: Secondary | ICD-10-CM | POA: Diagnosis not present

## 2019-08-13 DIAGNOSIS — E119 Type 2 diabetes mellitus without complications: Secondary | ICD-10-CM | POA: Diagnosis not present

## 2019-08-13 DIAGNOSIS — Z8249 Family history of ischemic heart disease and other diseases of the circulatory system: Secondary | ICD-10-CM | POA: Diagnosis not present

## 2019-08-13 DIAGNOSIS — N186 End stage renal disease: Secondary | ICD-10-CM | POA: Diagnosis not present

## 2019-08-15 DIAGNOSIS — N186 End stage renal disease: Secondary | ICD-10-CM | POA: Diagnosis not present

## 2019-08-15 DIAGNOSIS — E119 Type 2 diabetes mellitus without complications: Secondary | ICD-10-CM | POA: Diagnosis not present

## 2019-08-15 DIAGNOSIS — N2581 Secondary hyperparathyroidism of renal origin: Secondary | ICD-10-CM | POA: Diagnosis not present

## 2019-08-15 DIAGNOSIS — Z992 Dependence on renal dialysis: Secondary | ICD-10-CM | POA: Diagnosis not present

## 2019-08-18 DIAGNOSIS — N186 End stage renal disease: Secondary | ICD-10-CM | POA: Diagnosis not present

## 2019-08-18 DIAGNOSIS — Z992 Dependence on renal dialysis: Secondary | ICD-10-CM | POA: Diagnosis not present

## 2019-08-18 DIAGNOSIS — N2581 Secondary hyperparathyroidism of renal origin: Secondary | ICD-10-CM | POA: Diagnosis not present

## 2019-08-18 DIAGNOSIS — E119 Type 2 diabetes mellitus without complications: Secondary | ICD-10-CM | POA: Diagnosis not present

## 2019-08-20 DIAGNOSIS — E119 Type 2 diabetes mellitus without complications: Secondary | ICD-10-CM | POA: Diagnosis not present

## 2019-08-20 DIAGNOSIS — Z992 Dependence on renal dialysis: Secondary | ICD-10-CM | POA: Diagnosis not present

## 2019-08-20 DIAGNOSIS — N2581 Secondary hyperparathyroidism of renal origin: Secondary | ICD-10-CM | POA: Diagnosis not present

## 2019-08-20 DIAGNOSIS — N186 End stage renal disease: Secondary | ICD-10-CM | POA: Diagnosis not present

## 2019-08-22 DIAGNOSIS — N2581 Secondary hyperparathyroidism of renal origin: Secondary | ICD-10-CM | POA: Diagnosis not present

## 2019-08-22 DIAGNOSIS — E119 Type 2 diabetes mellitus without complications: Secondary | ICD-10-CM | POA: Diagnosis not present

## 2019-08-22 DIAGNOSIS — Z992 Dependence on renal dialysis: Secondary | ICD-10-CM | POA: Diagnosis not present

## 2019-08-22 DIAGNOSIS — N186 End stage renal disease: Secondary | ICD-10-CM | POA: Diagnosis not present

## 2019-08-25 DIAGNOSIS — Z992 Dependence on renal dialysis: Secondary | ICD-10-CM | POA: Diagnosis not present

## 2019-08-25 DIAGNOSIS — N186 End stage renal disease: Secondary | ICD-10-CM | POA: Diagnosis not present

## 2019-08-25 DIAGNOSIS — E119 Type 2 diabetes mellitus without complications: Secondary | ICD-10-CM | POA: Diagnosis not present

## 2019-08-25 DIAGNOSIS — N2581 Secondary hyperparathyroidism of renal origin: Secondary | ICD-10-CM | POA: Diagnosis not present

## 2019-08-27 DIAGNOSIS — Z992 Dependence on renal dialysis: Secondary | ICD-10-CM | POA: Diagnosis not present

## 2019-08-27 DIAGNOSIS — N2581 Secondary hyperparathyroidism of renal origin: Secondary | ICD-10-CM | POA: Diagnosis not present

## 2019-08-27 DIAGNOSIS — E119 Type 2 diabetes mellitus without complications: Secondary | ICD-10-CM | POA: Diagnosis not present

## 2019-08-27 DIAGNOSIS — N186 End stage renal disease: Secondary | ICD-10-CM | POA: Diagnosis not present

## 2019-08-29 DIAGNOSIS — E119 Type 2 diabetes mellitus without complications: Secondary | ICD-10-CM | POA: Diagnosis not present

## 2019-08-29 DIAGNOSIS — Z992 Dependence on renal dialysis: Secondary | ICD-10-CM | POA: Diagnosis not present

## 2019-08-29 DIAGNOSIS — N2581 Secondary hyperparathyroidism of renal origin: Secondary | ICD-10-CM | POA: Diagnosis not present

## 2019-08-29 DIAGNOSIS — N186 End stage renal disease: Secondary | ICD-10-CM | POA: Diagnosis not present

## 2019-09-01 DIAGNOSIS — E119 Type 2 diabetes mellitus without complications: Secondary | ICD-10-CM | POA: Diagnosis not present

## 2019-09-01 DIAGNOSIS — N186 End stage renal disease: Secondary | ICD-10-CM | POA: Diagnosis not present

## 2019-09-01 DIAGNOSIS — Z992 Dependence on renal dialysis: Secondary | ICD-10-CM | POA: Diagnosis not present

## 2019-09-01 DIAGNOSIS — N2581 Secondary hyperparathyroidism of renal origin: Secondary | ICD-10-CM | POA: Diagnosis not present

## 2019-09-01 DIAGNOSIS — I10 Essential (primary) hypertension: Secondary | ICD-10-CM | POA: Diagnosis not present

## 2019-09-03 DIAGNOSIS — N2581 Secondary hyperparathyroidism of renal origin: Secondary | ICD-10-CM | POA: Diagnosis not present

## 2019-09-03 DIAGNOSIS — Z992 Dependence on renal dialysis: Secondary | ICD-10-CM | POA: Diagnosis not present

## 2019-09-03 DIAGNOSIS — E119 Type 2 diabetes mellitus without complications: Secondary | ICD-10-CM | POA: Diagnosis not present

## 2019-09-03 DIAGNOSIS — N186 End stage renal disease: Secondary | ICD-10-CM | POA: Diagnosis not present

## 2019-09-05 DIAGNOSIS — N2581 Secondary hyperparathyroidism of renal origin: Secondary | ICD-10-CM | POA: Diagnosis not present

## 2019-09-05 DIAGNOSIS — N186 End stage renal disease: Secondary | ICD-10-CM | POA: Diagnosis not present

## 2019-09-05 DIAGNOSIS — E119 Type 2 diabetes mellitus without complications: Secondary | ICD-10-CM | POA: Diagnosis not present

## 2019-09-05 DIAGNOSIS — Z992 Dependence on renal dialysis: Secondary | ICD-10-CM | POA: Diagnosis not present

## 2019-09-08 DIAGNOSIS — N2581 Secondary hyperparathyroidism of renal origin: Secondary | ICD-10-CM | POA: Diagnosis not present

## 2019-09-08 DIAGNOSIS — Z992 Dependence on renal dialysis: Secondary | ICD-10-CM | POA: Diagnosis not present

## 2019-09-08 DIAGNOSIS — E119 Type 2 diabetes mellitus without complications: Secondary | ICD-10-CM | POA: Diagnosis not present

## 2019-09-08 DIAGNOSIS — N186 End stage renal disease: Secondary | ICD-10-CM | POA: Diagnosis not present

## 2019-09-09 DIAGNOSIS — N186 End stage renal disease: Secondary | ICD-10-CM | POA: Diagnosis not present

## 2019-09-09 DIAGNOSIS — T8612 Kidney transplant failure: Secondary | ICD-10-CM | POA: Diagnosis not present

## 2019-09-09 DIAGNOSIS — Z992 Dependence on renal dialysis: Secondary | ICD-10-CM | POA: Diagnosis not present

## 2019-09-10 DIAGNOSIS — N2581 Secondary hyperparathyroidism of renal origin: Secondary | ICD-10-CM | POA: Diagnosis not present

## 2019-09-10 DIAGNOSIS — E119 Type 2 diabetes mellitus without complications: Secondary | ICD-10-CM | POA: Diagnosis not present

## 2019-09-10 DIAGNOSIS — Z992 Dependence on renal dialysis: Secondary | ICD-10-CM | POA: Diagnosis not present

## 2019-09-10 DIAGNOSIS — N186 End stage renal disease: Secondary | ICD-10-CM | POA: Diagnosis not present

## 2019-09-10 DIAGNOSIS — Z23 Encounter for immunization: Secondary | ICD-10-CM | POA: Diagnosis not present

## 2019-09-12 DIAGNOSIS — E119 Type 2 diabetes mellitus without complications: Secondary | ICD-10-CM | POA: Diagnosis not present

## 2019-09-12 DIAGNOSIS — Z992 Dependence on renal dialysis: Secondary | ICD-10-CM | POA: Diagnosis not present

## 2019-09-12 DIAGNOSIS — Z23 Encounter for immunization: Secondary | ICD-10-CM | POA: Diagnosis not present

## 2019-09-12 DIAGNOSIS — N2581 Secondary hyperparathyroidism of renal origin: Secondary | ICD-10-CM | POA: Diagnosis not present

## 2019-09-12 DIAGNOSIS — N186 End stage renal disease: Secondary | ICD-10-CM | POA: Diagnosis not present

## 2019-09-15 DIAGNOSIS — N2581 Secondary hyperparathyroidism of renal origin: Secondary | ICD-10-CM | POA: Diagnosis not present

## 2019-09-15 DIAGNOSIS — Z992 Dependence on renal dialysis: Secondary | ICD-10-CM | POA: Diagnosis not present

## 2019-09-15 DIAGNOSIS — N186 End stage renal disease: Secondary | ICD-10-CM | POA: Diagnosis not present

## 2019-09-15 DIAGNOSIS — Z23 Encounter for immunization: Secondary | ICD-10-CM | POA: Diagnosis not present

## 2019-09-15 DIAGNOSIS — E119 Type 2 diabetes mellitus without complications: Secondary | ICD-10-CM | POA: Diagnosis not present

## 2019-09-17 DIAGNOSIS — E119 Type 2 diabetes mellitus without complications: Secondary | ICD-10-CM | POA: Diagnosis not present

## 2019-09-17 DIAGNOSIS — N186 End stage renal disease: Secondary | ICD-10-CM | POA: Diagnosis not present

## 2019-09-17 DIAGNOSIS — Z992 Dependence on renal dialysis: Secondary | ICD-10-CM | POA: Diagnosis not present

## 2019-09-17 DIAGNOSIS — Z23 Encounter for immunization: Secondary | ICD-10-CM | POA: Diagnosis not present

## 2019-09-17 DIAGNOSIS — N2581 Secondary hyperparathyroidism of renal origin: Secondary | ICD-10-CM | POA: Diagnosis not present

## 2019-09-19 DIAGNOSIS — N2581 Secondary hyperparathyroidism of renal origin: Secondary | ICD-10-CM | POA: Diagnosis not present

## 2019-09-19 DIAGNOSIS — Z23 Encounter for immunization: Secondary | ICD-10-CM | POA: Diagnosis not present

## 2019-09-19 DIAGNOSIS — N186 End stage renal disease: Secondary | ICD-10-CM | POA: Diagnosis not present

## 2019-09-19 DIAGNOSIS — E119 Type 2 diabetes mellitus without complications: Secondary | ICD-10-CM | POA: Diagnosis not present

## 2019-09-19 DIAGNOSIS — Z992 Dependence on renal dialysis: Secondary | ICD-10-CM | POA: Diagnosis not present

## 2019-09-22 DIAGNOSIS — E119 Type 2 diabetes mellitus without complications: Secondary | ICD-10-CM | POA: Diagnosis not present

## 2019-09-22 DIAGNOSIS — N2581 Secondary hyperparathyroidism of renal origin: Secondary | ICD-10-CM | POA: Diagnosis not present

## 2019-09-22 DIAGNOSIS — Z992 Dependence on renal dialysis: Secondary | ICD-10-CM | POA: Diagnosis not present

## 2019-09-22 DIAGNOSIS — N186 End stage renal disease: Secondary | ICD-10-CM | POA: Diagnosis not present

## 2019-09-22 DIAGNOSIS — Z23 Encounter for immunization: Secondary | ICD-10-CM | POA: Diagnosis not present

## 2019-09-24 DIAGNOSIS — Z23 Encounter for immunization: Secondary | ICD-10-CM | POA: Diagnosis not present

## 2019-09-24 DIAGNOSIS — N186 End stage renal disease: Secondary | ICD-10-CM | POA: Diagnosis not present

## 2019-09-24 DIAGNOSIS — N2581 Secondary hyperparathyroidism of renal origin: Secondary | ICD-10-CM | POA: Diagnosis not present

## 2019-09-24 DIAGNOSIS — E119 Type 2 diabetes mellitus without complications: Secondary | ICD-10-CM | POA: Diagnosis not present

## 2019-09-24 DIAGNOSIS — Z992 Dependence on renal dialysis: Secondary | ICD-10-CM | POA: Diagnosis not present

## 2019-09-26 DIAGNOSIS — N2581 Secondary hyperparathyroidism of renal origin: Secondary | ICD-10-CM | POA: Diagnosis not present

## 2019-09-26 DIAGNOSIS — Z23 Encounter for immunization: Secondary | ICD-10-CM | POA: Diagnosis not present

## 2019-09-26 DIAGNOSIS — N186 End stage renal disease: Secondary | ICD-10-CM | POA: Diagnosis not present

## 2019-09-26 DIAGNOSIS — Z992 Dependence on renal dialysis: Secondary | ICD-10-CM | POA: Diagnosis not present

## 2019-09-26 DIAGNOSIS — E119 Type 2 diabetes mellitus without complications: Secondary | ICD-10-CM | POA: Diagnosis not present

## 2019-09-28 DIAGNOSIS — T782XXA Anaphylactic shock, unspecified, initial encounter: Secondary | ICD-10-CM | POA: Insufficient documentation

## 2019-09-28 DIAGNOSIS — T7840XA Allergy, unspecified, initial encounter: Secondary | ICD-10-CM | POA: Insufficient documentation

## 2019-09-29 DIAGNOSIS — E119 Type 2 diabetes mellitus without complications: Secondary | ICD-10-CM | POA: Diagnosis not present

## 2019-09-29 DIAGNOSIS — N186 End stage renal disease: Secondary | ICD-10-CM | POA: Diagnosis not present

## 2019-09-29 DIAGNOSIS — Z23 Encounter for immunization: Secondary | ICD-10-CM | POA: Diagnosis not present

## 2019-09-29 DIAGNOSIS — Z992 Dependence on renal dialysis: Secondary | ICD-10-CM | POA: Diagnosis not present

## 2019-09-29 DIAGNOSIS — N2581 Secondary hyperparathyroidism of renal origin: Secondary | ICD-10-CM | POA: Diagnosis not present

## 2019-10-01 DIAGNOSIS — N186 End stage renal disease: Secondary | ICD-10-CM | POA: Diagnosis not present

## 2019-10-01 DIAGNOSIS — N2581 Secondary hyperparathyroidism of renal origin: Secondary | ICD-10-CM | POA: Diagnosis not present

## 2019-10-01 DIAGNOSIS — Z23 Encounter for immunization: Secondary | ICD-10-CM | POA: Diagnosis not present

## 2019-10-01 DIAGNOSIS — Z992 Dependence on renal dialysis: Secondary | ICD-10-CM | POA: Diagnosis not present

## 2019-10-01 DIAGNOSIS — E119 Type 2 diabetes mellitus without complications: Secondary | ICD-10-CM | POA: Diagnosis not present

## 2019-10-03 DIAGNOSIS — E119 Type 2 diabetes mellitus without complications: Secondary | ICD-10-CM | POA: Diagnosis not present

## 2019-10-03 DIAGNOSIS — N2581 Secondary hyperparathyroidism of renal origin: Secondary | ICD-10-CM | POA: Diagnosis not present

## 2019-10-03 DIAGNOSIS — N186 End stage renal disease: Secondary | ICD-10-CM | POA: Diagnosis not present

## 2019-10-03 DIAGNOSIS — Z23 Encounter for immunization: Secondary | ICD-10-CM | POA: Diagnosis not present

## 2019-10-03 DIAGNOSIS — Z992 Dependence on renal dialysis: Secondary | ICD-10-CM | POA: Diagnosis not present

## 2019-10-06 DIAGNOSIS — Z23 Encounter for immunization: Secondary | ICD-10-CM | POA: Diagnosis not present

## 2019-10-06 DIAGNOSIS — E119 Type 2 diabetes mellitus without complications: Secondary | ICD-10-CM | POA: Diagnosis not present

## 2019-10-06 DIAGNOSIS — N2581 Secondary hyperparathyroidism of renal origin: Secondary | ICD-10-CM | POA: Diagnosis not present

## 2019-10-06 DIAGNOSIS — N186 End stage renal disease: Secondary | ICD-10-CM | POA: Diagnosis not present

## 2019-10-06 DIAGNOSIS — Z992 Dependence on renal dialysis: Secondary | ICD-10-CM | POA: Diagnosis not present

## 2019-10-08 DIAGNOSIS — N186 End stage renal disease: Secondary | ICD-10-CM | POA: Diagnosis not present

## 2019-10-08 DIAGNOSIS — E119 Type 2 diabetes mellitus without complications: Secondary | ICD-10-CM | POA: Diagnosis not present

## 2019-10-08 DIAGNOSIS — N2581 Secondary hyperparathyroidism of renal origin: Secondary | ICD-10-CM | POA: Diagnosis not present

## 2019-10-08 DIAGNOSIS — Z992 Dependence on renal dialysis: Secondary | ICD-10-CM | POA: Diagnosis not present

## 2019-10-08 DIAGNOSIS — Z23 Encounter for immunization: Secondary | ICD-10-CM | POA: Diagnosis not present

## 2019-10-09 DIAGNOSIS — N186 End stage renal disease: Secondary | ICD-10-CM | POA: Diagnosis not present

## 2019-10-09 DIAGNOSIS — T8612 Kidney transplant failure: Secondary | ICD-10-CM | POA: Diagnosis not present

## 2019-10-09 DIAGNOSIS — Z992 Dependence on renal dialysis: Secondary | ICD-10-CM | POA: Diagnosis not present

## 2019-10-10 DIAGNOSIS — N186 End stage renal disease: Secondary | ICD-10-CM | POA: Diagnosis not present

## 2019-10-10 DIAGNOSIS — N2581 Secondary hyperparathyroidism of renal origin: Secondary | ICD-10-CM | POA: Diagnosis not present

## 2019-10-10 DIAGNOSIS — Z992 Dependence on renal dialysis: Secondary | ICD-10-CM | POA: Diagnosis not present

## 2019-10-10 DIAGNOSIS — E119 Type 2 diabetes mellitus without complications: Secondary | ICD-10-CM | POA: Diagnosis not present

## 2019-10-13 DIAGNOSIS — Z992 Dependence on renal dialysis: Secondary | ICD-10-CM | POA: Diagnosis not present

## 2019-10-13 DIAGNOSIS — N186 End stage renal disease: Secondary | ICD-10-CM | POA: Diagnosis not present

## 2019-10-13 DIAGNOSIS — N2581 Secondary hyperparathyroidism of renal origin: Secondary | ICD-10-CM | POA: Diagnosis not present

## 2019-10-13 DIAGNOSIS — E119 Type 2 diabetes mellitus without complications: Secondary | ICD-10-CM | POA: Diagnosis not present

## 2019-10-15 DIAGNOSIS — N186 End stage renal disease: Secondary | ICD-10-CM | POA: Diagnosis not present

## 2019-10-15 DIAGNOSIS — Z992 Dependence on renal dialysis: Secondary | ICD-10-CM | POA: Diagnosis not present

## 2019-10-15 DIAGNOSIS — N2581 Secondary hyperparathyroidism of renal origin: Secondary | ICD-10-CM | POA: Diagnosis not present

## 2019-10-15 DIAGNOSIS — E119 Type 2 diabetes mellitus without complications: Secondary | ICD-10-CM | POA: Diagnosis not present

## 2019-10-17 DIAGNOSIS — E119 Type 2 diabetes mellitus without complications: Secondary | ICD-10-CM | POA: Diagnosis not present

## 2019-10-17 DIAGNOSIS — N2581 Secondary hyperparathyroidism of renal origin: Secondary | ICD-10-CM | POA: Diagnosis not present

## 2019-10-17 DIAGNOSIS — Z992 Dependence on renal dialysis: Secondary | ICD-10-CM | POA: Diagnosis not present

## 2019-10-17 DIAGNOSIS — N186 End stage renal disease: Secondary | ICD-10-CM | POA: Diagnosis not present

## 2019-10-20 DIAGNOSIS — N2581 Secondary hyperparathyroidism of renal origin: Secondary | ICD-10-CM | POA: Diagnosis not present

## 2019-10-20 DIAGNOSIS — E119 Type 2 diabetes mellitus without complications: Secondary | ICD-10-CM | POA: Diagnosis not present

## 2019-10-20 DIAGNOSIS — Z992 Dependence on renal dialysis: Secondary | ICD-10-CM | POA: Diagnosis not present

## 2019-10-20 DIAGNOSIS — N186 End stage renal disease: Secondary | ICD-10-CM | POA: Diagnosis not present

## 2019-10-22 DIAGNOSIS — N186 End stage renal disease: Secondary | ICD-10-CM | POA: Diagnosis not present

## 2019-10-22 DIAGNOSIS — E119 Type 2 diabetes mellitus without complications: Secondary | ICD-10-CM | POA: Diagnosis not present

## 2019-10-22 DIAGNOSIS — N2581 Secondary hyperparathyroidism of renal origin: Secondary | ICD-10-CM | POA: Diagnosis not present

## 2019-10-22 DIAGNOSIS — Z992 Dependence on renal dialysis: Secondary | ICD-10-CM | POA: Diagnosis not present

## 2019-10-24 DIAGNOSIS — N186 End stage renal disease: Secondary | ICD-10-CM | POA: Diagnosis not present

## 2019-10-24 DIAGNOSIS — Z992 Dependence on renal dialysis: Secondary | ICD-10-CM | POA: Diagnosis not present

## 2019-10-24 DIAGNOSIS — N2581 Secondary hyperparathyroidism of renal origin: Secondary | ICD-10-CM | POA: Diagnosis not present

## 2019-10-24 DIAGNOSIS — E119 Type 2 diabetes mellitus without complications: Secondary | ICD-10-CM | POA: Diagnosis not present

## 2019-10-27 DIAGNOSIS — Z992 Dependence on renal dialysis: Secondary | ICD-10-CM | POA: Diagnosis not present

## 2019-10-27 DIAGNOSIS — N2581 Secondary hyperparathyroidism of renal origin: Secondary | ICD-10-CM | POA: Diagnosis not present

## 2019-10-27 DIAGNOSIS — E119 Type 2 diabetes mellitus without complications: Secondary | ICD-10-CM | POA: Diagnosis not present

## 2019-10-27 DIAGNOSIS — N186 End stage renal disease: Secondary | ICD-10-CM | POA: Diagnosis not present

## 2019-10-29 DIAGNOSIS — N186 End stage renal disease: Secondary | ICD-10-CM | POA: Diagnosis not present

## 2019-10-29 DIAGNOSIS — N2581 Secondary hyperparathyroidism of renal origin: Secondary | ICD-10-CM | POA: Diagnosis not present

## 2019-10-29 DIAGNOSIS — Z992 Dependence on renal dialysis: Secondary | ICD-10-CM | POA: Diagnosis not present

## 2019-10-29 DIAGNOSIS — E119 Type 2 diabetes mellitus without complications: Secondary | ICD-10-CM | POA: Diagnosis not present

## 2019-10-31 DIAGNOSIS — N186 End stage renal disease: Secondary | ICD-10-CM | POA: Diagnosis not present

## 2019-10-31 DIAGNOSIS — Z992 Dependence on renal dialysis: Secondary | ICD-10-CM | POA: Diagnosis not present

## 2019-10-31 DIAGNOSIS — E119 Type 2 diabetes mellitus without complications: Secondary | ICD-10-CM | POA: Diagnosis not present

## 2019-10-31 DIAGNOSIS — N2581 Secondary hyperparathyroidism of renal origin: Secondary | ICD-10-CM | POA: Diagnosis not present

## 2019-11-03 DIAGNOSIS — E119 Type 2 diabetes mellitus without complications: Secondary | ICD-10-CM | POA: Diagnosis not present

## 2019-11-03 DIAGNOSIS — Z992 Dependence on renal dialysis: Secondary | ICD-10-CM | POA: Diagnosis not present

## 2019-11-03 DIAGNOSIS — N186 End stage renal disease: Secondary | ICD-10-CM | POA: Diagnosis not present

## 2019-11-03 DIAGNOSIS — N2581 Secondary hyperparathyroidism of renal origin: Secondary | ICD-10-CM | POA: Diagnosis not present

## 2019-11-05 DIAGNOSIS — N2581 Secondary hyperparathyroidism of renal origin: Secondary | ICD-10-CM | POA: Diagnosis not present

## 2019-11-05 DIAGNOSIS — Z992 Dependence on renal dialysis: Secondary | ICD-10-CM | POA: Diagnosis not present

## 2019-11-05 DIAGNOSIS — E119 Type 2 diabetes mellitus without complications: Secondary | ICD-10-CM | POA: Diagnosis not present

## 2019-11-05 DIAGNOSIS — N186 End stage renal disease: Secondary | ICD-10-CM | POA: Diagnosis not present

## 2019-11-07 DIAGNOSIS — N186 End stage renal disease: Secondary | ICD-10-CM | POA: Diagnosis not present

## 2019-11-07 DIAGNOSIS — N2581 Secondary hyperparathyroidism of renal origin: Secondary | ICD-10-CM | POA: Diagnosis not present

## 2019-11-07 DIAGNOSIS — Z992 Dependence on renal dialysis: Secondary | ICD-10-CM | POA: Diagnosis not present

## 2019-11-07 DIAGNOSIS — E119 Type 2 diabetes mellitus without complications: Secondary | ICD-10-CM | POA: Diagnosis not present

## 2019-11-09 DIAGNOSIS — Z992 Dependence on renal dialysis: Secondary | ICD-10-CM | POA: Diagnosis not present

## 2019-11-09 DIAGNOSIS — T8612 Kidney transplant failure: Secondary | ICD-10-CM | POA: Diagnosis not present

## 2019-11-09 DIAGNOSIS — N186 End stage renal disease: Secondary | ICD-10-CM | POA: Diagnosis not present

## 2019-11-10 DIAGNOSIS — N186 End stage renal disease: Secondary | ICD-10-CM | POA: Diagnosis not present

## 2019-11-10 DIAGNOSIS — Z992 Dependence on renal dialysis: Secondary | ICD-10-CM | POA: Diagnosis not present

## 2019-11-10 DIAGNOSIS — N2581 Secondary hyperparathyroidism of renal origin: Secondary | ICD-10-CM | POA: Diagnosis not present

## 2019-11-10 DIAGNOSIS — E119 Type 2 diabetes mellitus without complications: Secondary | ICD-10-CM | POA: Diagnosis not present

## 2019-11-12 DIAGNOSIS — Z992 Dependence on renal dialysis: Secondary | ICD-10-CM | POA: Diagnosis not present

## 2019-11-12 DIAGNOSIS — N2581 Secondary hyperparathyroidism of renal origin: Secondary | ICD-10-CM | POA: Diagnosis not present

## 2019-11-12 DIAGNOSIS — E119 Type 2 diabetes mellitus without complications: Secondary | ICD-10-CM | POA: Diagnosis not present

## 2019-11-12 DIAGNOSIS — N186 End stage renal disease: Secondary | ICD-10-CM | POA: Diagnosis not present

## 2019-11-14 DIAGNOSIS — E119 Type 2 diabetes mellitus without complications: Secondary | ICD-10-CM | POA: Diagnosis not present

## 2019-11-14 DIAGNOSIS — N186 End stage renal disease: Secondary | ICD-10-CM | POA: Diagnosis not present

## 2019-11-14 DIAGNOSIS — N2581 Secondary hyperparathyroidism of renal origin: Secondary | ICD-10-CM | POA: Diagnosis not present

## 2019-11-14 DIAGNOSIS — Z992 Dependence on renal dialysis: Secondary | ICD-10-CM | POA: Diagnosis not present

## 2019-11-17 DIAGNOSIS — N2581 Secondary hyperparathyroidism of renal origin: Secondary | ICD-10-CM | POA: Diagnosis not present

## 2019-11-17 DIAGNOSIS — Z992 Dependence on renal dialysis: Secondary | ICD-10-CM | POA: Diagnosis not present

## 2019-11-17 DIAGNOSIS — N186 End stage renal disease: Secondary | ICD-10-CM | POA: Diagnosis not present

## 2019-11-17 DIAGNOSIS — E119 Type 2 diabetes mellitus without complications: Secondary | ICD-10-CM | POA: Diagnosis not present

## 2019-11-19 DIAGNOSIS — N186 End stage renal disease: Secondary | ICD-10-CM | POA: Diagnosis not present

## 2019-11-19 DIAGNOSIS — N2581 Secondary hyperparathyroidism of renal origin: Secondary | ICD-10-CM | POA: Diagnosis not present

## 2019-11-19 DIAGNOSIS — Z992 Dependence on renal dialysis: Secondary | ICD-10-CM | POA: Diagnosis not present

## 2019-11-19 DIAGNOSIS — E119 Type 2 diabetes mellitus without complications: Secondary | ICD-10-CM | POA: Diagnosis not present

## 2019-11-21 DIAGNOSIS — Z992 Dependence on renal dialysis: Secondary | ICD-10-CM | POA: Diagnosis not present

## 2019-11-21 DIAGNOSIS — E119 Type 2 diabetes mellitus without complications: Secondary | ICD-10-CM | POA: Diagnosis not present

## 2019-11-21 DIAGNOSIS — N186 End stage renal disease: Secondary | ICD-10-CM | POA: Diagnosis not present

## 2019-11-21 DIAGNOSIS — N2581 Secondary hyperparathyroidism of renal origin: Secondary | ICD-10-CM | POA: Diagnosis not present

## 2019-11-24 DIAGNOSIS — N2581 Secondary hyperparathyroidism of renal origin: Secondary | ICD-10-CM | POA: Diagnosis not present

## 2019-11-24 DIAGNOSIS — Z992 Dependence on renal dialysis: Secondary | ICD-10-CM | POA: Diagnosis not present

## 2019-11-24 DIAGNOSIS — N186 End stage renal disease: Secondary | ICD-10-CM | POA: Diagnosis not present

## 2019-11-24 DIAGNOSIS — E119 Type 2 diabetes mellitus without complications: Secondary | ICD-10-CM | POA: Diagnosis not present

## 2019-11-26 DIAGNOSIS — N2581 Secondary hyperparathyroidism of renal origin: Secondary | ICD-10-CM | POA: Diagnosis not present

## 2019-11-26 DIAGNOSIS — E119 Type 2 diabetes mellitus without complications: Secondary | ICD-10-CM | POA: Diagnosis not present

## 2019-11-26 DIAGNOSIS — N186 End stage renal disease: Secondary | ICD-10-CM | POA: Diagnosis not present

## 2019-11-26 DIAGNOSIS — Z992 Dependence on renal dialysis: Secondary | ICD-10-CM | POA: Diagnosis not present

## 2019-11-28 DIAGNOSIS — E119 Type 2 diabetes mellitus without complications: Secondary | ICD-10-CM | POA: Diagnosis not present

## 2019-11-28 DIAGNOSIS — N186 End stage renal disease: Secondary | ICD-10-CM | POA: Diagnosis not present

## 2019-11-28 DIAGNOSIS — N2581 Secondary hyperparathyroidism of renal origin: Secondary | ICD-10-CM | POA: Diagnosis not present

## 2019-11-28 DIAGNOSIS — Z992 Dependence on renal dialysis: Secondary | ICD-10-CM | POA: Diagnosis not present

## 2019-11-30 DIAGNOSIS — Z992 Dependence on renal dialysis: Secondary | ICD-10-CM | POA: Diagnosis not present

## 2019-11-30 DIAGNOSIS — N2581 Secondary hyperparathyroidism of renal origin: Secondary | ICD-10-CM | POA: Diagnosis not present

## 2019-11-30 DIAGNOSIS — N186 End stage renal disease: Secondary | ICD-10-CM | POA: Diagnosis not present

## 2019-11-30 DIAGNOSIS — E119 Type 2 diabetes mellitus without complications: Secondary | ICD-10-CM | POA: Diagnosis not present

## 2019-12-02 DIAGNOSIS — Z992 Dependence on renal dialysis: Secondary | ICD-10-CM | POA: Diagnosis not present

## 2019-12-02 DIAGNOSIS — N2581 Secondary hyperparathyroidism of renal origin: Secondary | ICD-10-CM | POA: Diagnosis not present

## 2019-12-02 DIAGNOSIS — N186 End stage renal disease: Secondary | ICD-10-CM | POA: Diagnosis not present

## 2019-12-02 DIAGNOSIS — E119 Type 2 diabetes mellitus without complications: Secondary | ICD-10-CM | POA: Diagnosis not present

## 2019-12-05 DIAGNOSIS — N186 End stage renal disease: Secondary | ICD-10-CM | POA: Diagnosis not present

## 2019-12-05 DIAGNOSIS — N2581 Secondary hyperparathyroidism of renal origin: Secondary | ICD-10-CM | POA: Diagnosis not present

## 2019-12-05 DIAGNOSIS — Z992 Dependence on renal dialysis: Secondary | ICD-10-CM | POA: Diagnosis not present

## 2019-12-05 DIAGNOSIS — E119 Type 2 diabetes mellitus without complications: Secondary | ICD-10-CM | POA: Diagnosis not present

## 2019-12-08 DIAGNOSIS — N2581 Secondary hyperparathyroidism of renal origin: Secondary | ICD-10-CM | POA: Diagnosis not present

## 2019-12-08 DIAGNOSIS — N186 End stage renal disease: Secondary | ICD-10-CM | POA: Diagnosis not present

## 2019-12-08 DIAGNOSIS — E119 Type 2 diabetes mellitus without complications: Secondary | ICD-10-CM | POA: Diagnosis not present

## 2019-12-08 DIAGNOSIS — Z992 Dependence on renal dialysis: Secondary | ICD-10-CM | POA: Diagnosis not present

## 2019-12-09 DIAGNOSIS — N186 End stage renal disease: Secondary | ICD-10-CM | POA: Diagnosis not present

## 2019-12-09 DIAGNOSIS — T8612 Kidney transplant failure: Secondary | ICD-10-CM | POA: Diagnosis not present

## 2019-12-09 DIAGNOSIS — Z992 Dependence on renal dialysis: Secondary | ICD-10-CM | POA: Diagnosis not present

## 2019-12-10 DIAGNOSIS — N2581 Secondary hyperparathyroidism of renal origin: Secondary | ICD-10-CM | POA: Diagnosis not present

## 2019-12-10 DIAGNOSIS — E119 Type 2 diabetes mellitus without complications: Secondary | ICD-10-CM | POA: Diagnosis not present

## 2019-12-10 DIAGNOSIS — Z992 Dependence on renal dialysis: Secondary | ICD-10-CM | POA: Diagnosis not present

## 2019-12-10 DIAGNOSIS — N186 End stage renal disease: Secondary | ICD-10-CM | POA: Diagnosis not present

## 2019-12-12 DIAGNOSIS — N186 End stage renal disease: Secondary | ICD-10-CM | POA: Diagnosis not present

## 2019-12-12 DIAGNOSIS — E119 Type 2 diabetes mellitus without complications: Secondary | ICD-10-CM | POA: Diagnosis not present

## 2019-12-12 DIAGNOSIS — Z992 Dependence on renal dialysis: Secondary | ICD-10-CM | POA: Diagnosis not present

## 2019-12-12 DIAGNOSIS — N2581 Secondary hyperparathyroidism of renal origin: Secondary | ICD-10-CM | POA: Diagnosis not present

## 2019-12-15 DIAGNOSIS — Z992 Dependence on renal dialysis: Secondary | ICD-10-CM | POA: Diagnosis not present

## 2019-12-15 DIAGNOSIS — E119 Type 2 diabetes mellitus without complications: Secondary | ICD-10-CM | POA: Diagnosis not present

## 2019-12-15 DIAGNOSIS — N186 End stage renal disease: Secondary | ICD-10-CM | POA: Diagnosis not present

## 2019-12-15 DIAGNOSIS — N2581 Secondary hyperparathyroidism of renal origin: Secondary | ICD-10-CM | POA: Diagnosis not present

## 2019-12-17 DIAGNOSIS — N186 End stage renal disease: Secondary | ICD-10-CM | POA: Diagnosis not present

## 2019-12-17 DIAGNOSIS — E119 Type 2 diabetes mellitus without complications: Secondary | ICD-10-CM | POA: Diagnosis not present

## 2019-12-17 DIAGNOSIS — Z992 Dependence on renal dialysis: Secondary | ICD-10-CM | POA: Diagnosis not present

## 2019-12-17 DIAGNOSIS — N2581 Secondary hyperparathyroidism of renal origin: Secondary | ICD-10-CM | POA: Diagnosis not present

## 2019-12-19 DIAGNOSIS — E119 Type 2 diabetes mellitus without complications: Secondary | ICD-10-CM | POA: Diagnosis not present

## 2019-12-19 DIAGNOSIS — N2581 Secondary hyperparathyroidism of renal origin: Secondary | ICD-10-CM | POA: Diagnosis not present

## 2019-12-19 DIAGNOSIS — Z992 Dependence on renal dialysis: Secondary | ICD-10-CM | POA: Diagnosis not present

## 2019-12-19 DIAGNOSIS — N186 End stage renal disease: Secondary | ICD-10-CM | POA: Diagnosis not present

## 2019-12-22 DIAGNOSIS — E119 Type 2 diabetes mellitus without complications: Secondary | ICD-10-CM | POA: Diagnosis not present

## 2019-12-22 DIAGNOSIS — N186 End stage renal disease: Secondary | ICD-10-CM | POA: Diagnosis not present

## 2019-12-22 DIAGNOSIS — Z992 Dependence on renal dialysis: Secondary | ICD-10-CM | POA: Diagnosis not present

## 2019-12-22 DIAGNOSIS — N2581 Secondary hyperparathyroidism of renal origin: Secondary | ICD-10-CM | POA: Diagnosis not present

## 2019-12-24 DIAGNOSIS — N186 End stage renal disease: Secondary | ICD-10-CM | POA: Diagnosis not present

## 2019-12-24 DIAGNOSIS — Z992 Dependence on renal dialysis: Secondary | ICD-10-CM | POA: Diagnosis not present

## 2019-12-24 DIAGNOSIS — N2581 Secondary hyperparathyroidism of renal origin: Secondary | ICD-10-CM | POA: Diagnosis not present

## 2019-12-24 DIAGNOSIS — E119 Type 2 diabetes mellitus without complications: Secondary | ICD-10-CM | POA: Diagnosis not present

## 2019-12-26 DIAGNOSIS — N2581 Secondary hyperparathyroidism of renal origin: Secondary | ICD-10-CM | POA: Diagnosis not present

## 2019-12-26 DIAGNOSIS — E119 Type 2 diabetes mellitus without complications: Secondary | ICD-10-CM | POA: Diagnosis not present

## 2019-12-26 DIAGNOSIS — N186 End stage renal disease: Secondary | ICD-10-CM | POA: Diagnosis not present

## 2019-12-26 DIAGNOSIS — Z992 Dependence on renal dialysis: Secondary | ICD-10-CM | POA: Diagnosis not present

## 2019-12-29 DIAGNOSIS — Z992 Dependence on renal dialysis: Secondary | ICD-10-CM | POA: Diagnosis not present

## 2019-12-29 DIAGNOSIS — N2581 Secondary hyperparathyroidism of renal origin: Secondary | ICD-10-CM | POA: Diagnosis not present

## 2019-12-29 DIAGNOSIS — N186 End stage renal disease: Secondary | ICD-10-CM | POA: Diagnosis not present

## 2019-12-29 DIAGNOSIS — E119 Type 2 diabetes mellitus without complications: Secondary | ICD-10-CM | POA: Diagnosis not present

## 2019-12-31 DIAGNOSIS — N2581 Secondary hyperparathyroidism of renal origin: Secondary | ICD-10-CM | POA: Diagnosis not present

## 2019-12-31 DIAGNOSIS — N186 End stage renal disease: Secondary | ICD-10-CM | POA: Diagnosis not present

## 2019-12-31 DIAGNOSIS — E119 Type 2 diabetes mellitus without complications: Secondary | ICD-10-CM | POA: Diagnosis not present

## 2019-12-31 DIAGNOSIS — Z992 Dependence on renal dialysis: Secondary | ICD-10-CM | POA: Diagnosis not present

## 2020-01-03 DIAGNOSIS — Z992 Dependence on renal dialysis: Secondary | ICD-10-CM | POA: Diagnosis not present

## 2020-01-03 DIAGNOSIS — N2581 Secondary hyperparathyroidism of renal origin: Secondary | ICD-10-CM | POA: Diagnosis not present

## 2020-01-03 DIAGNOSIS — E119 Type 2 diabetes mellitus without complications: Secondary | ICD-10-CM | POA: Diagnosis not present

## 2020-01-03 DIAGNOSIS — N186 End stage renal disease: Secondary | ICD-10-CM | POA: Diagnosis not present

## 2020-01-05 DIAGNOSIS — Z992 Dependence on renal dialysis: Secondary | ICD-10-CM | POA: Diagnosis not present

## 2020-01-05 DIAGNOSIS — N2581 Secondary hyperparathyroidism of renal origin: Secondary | ICD-10-CM | POA: Diagnosis not present

## 2020-01-05 DIAGNOSIS — E119 Type 2 diabetes mellitus without complications: Secondary | ICD-10-CM | POA: Diagnosis not present

## 2020-01-05 DIAGNOSIS — N186 End stage renal disease: Secondary | ICD-10-CM | POA: Diagnosis not present

## 2020-01-07 DIAGNOSIS — E119 Type 2 diabetes mellitus without complications: Secondary | ICD-10-CM | POA: Diagnosis not present

## 2020-01-07 DIAGNOSIS — N2581 Secondary hyperparathyroidism of renal origin: Secondary | ICD-10-CM | POA: Diagnosis not present

## 2020-01-07 DIAGNOSIS — N186 End stage renal disease: Secondary | ICD-10-CM | POA: Diagnosis not present

## 2020-01-07 DIAGNOSIS — Z992 Dependence on renal dialysis: Secondary | ICD-10-CM | POA: Diagnosis not present

## 2020-02-21 IMAGING — CR DG CHEST 2V
2 series · 2 of 2 positions shown · non-contrast
Comparison: Chest x-rays dated 05/02/2016 and 11/15/2013

CLINICAL DATA: Productive cough for 3 weeks. History of CHF, COPD,
asthma, TIA.

EXAM:
CHEST - 2 VIEW

[w chest pa]
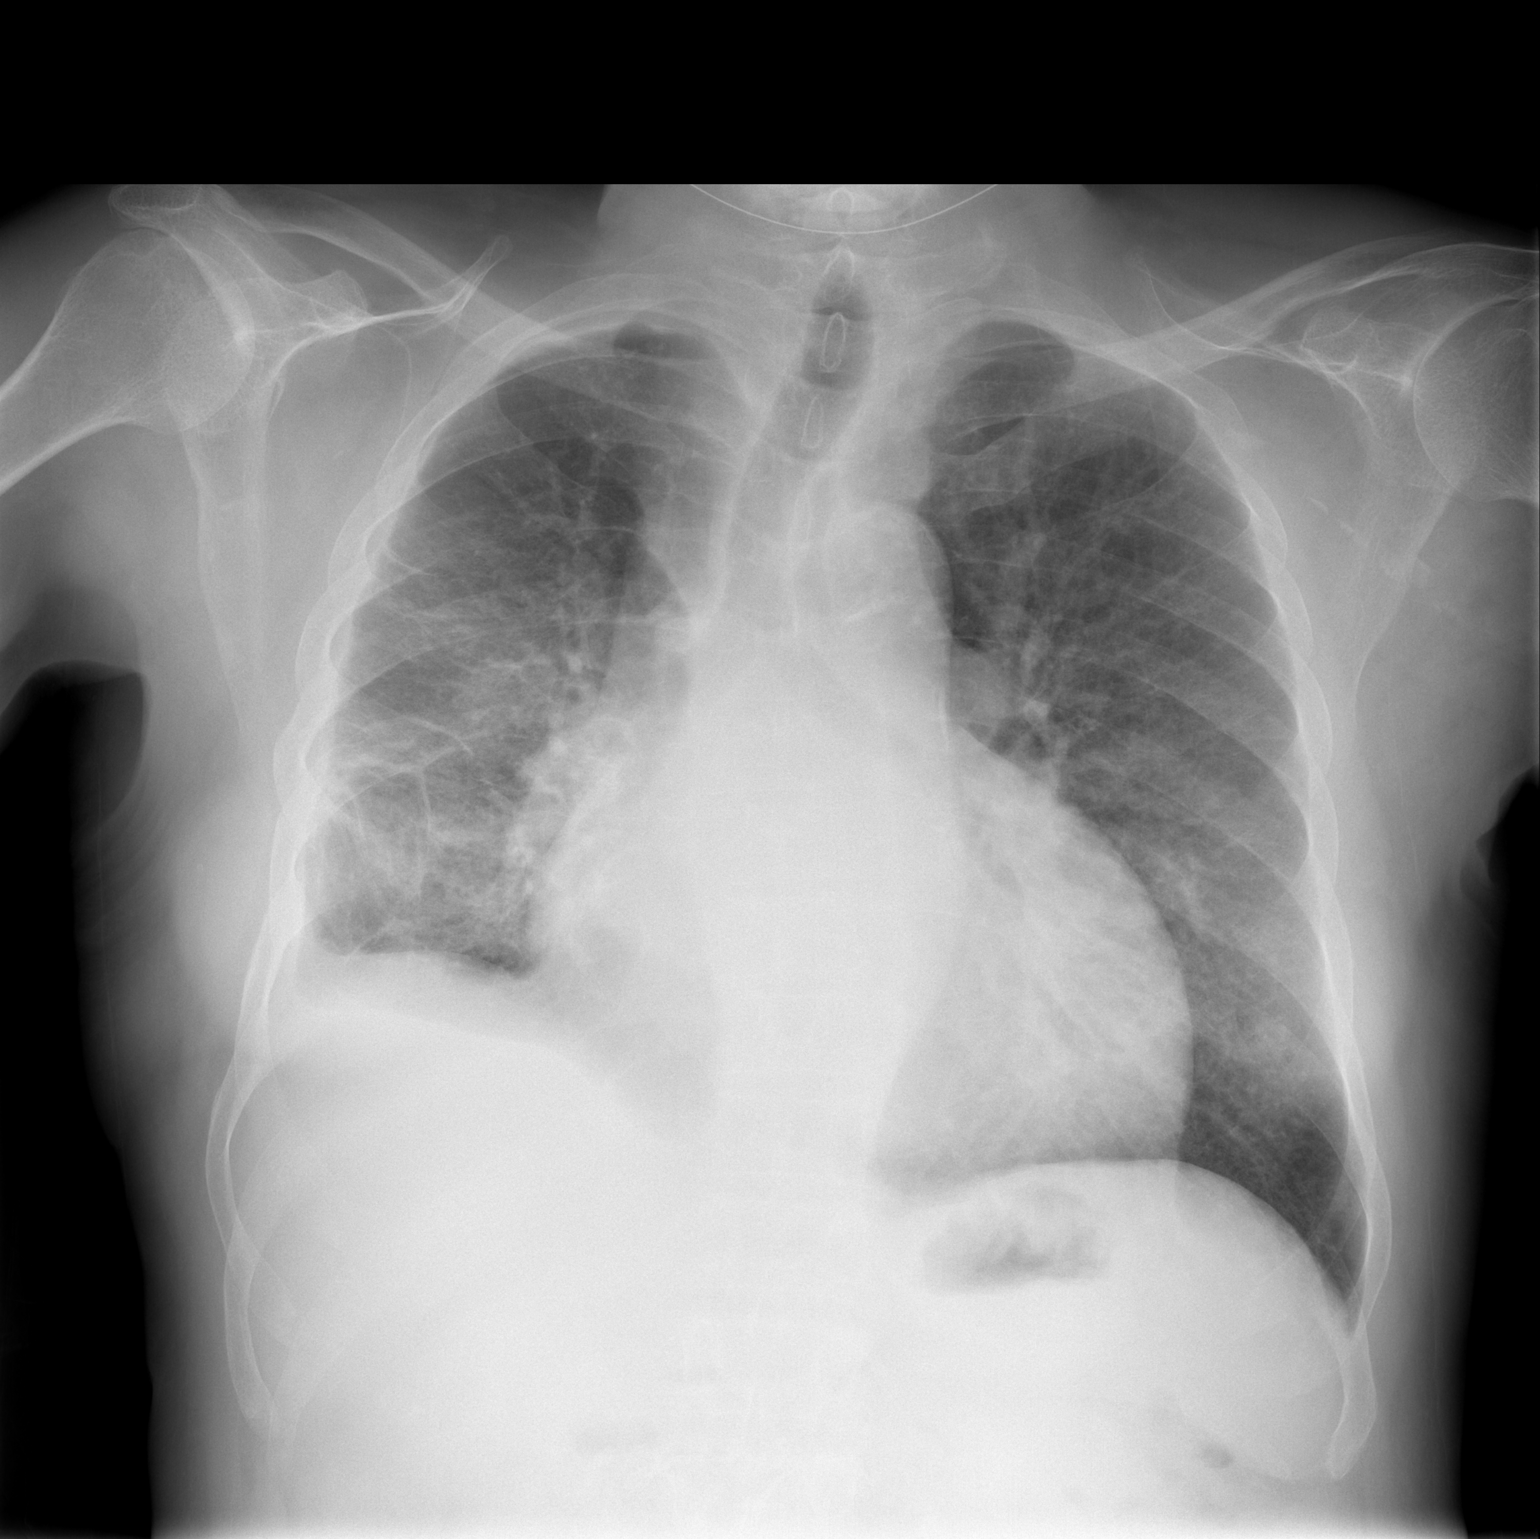

[w chest lat]
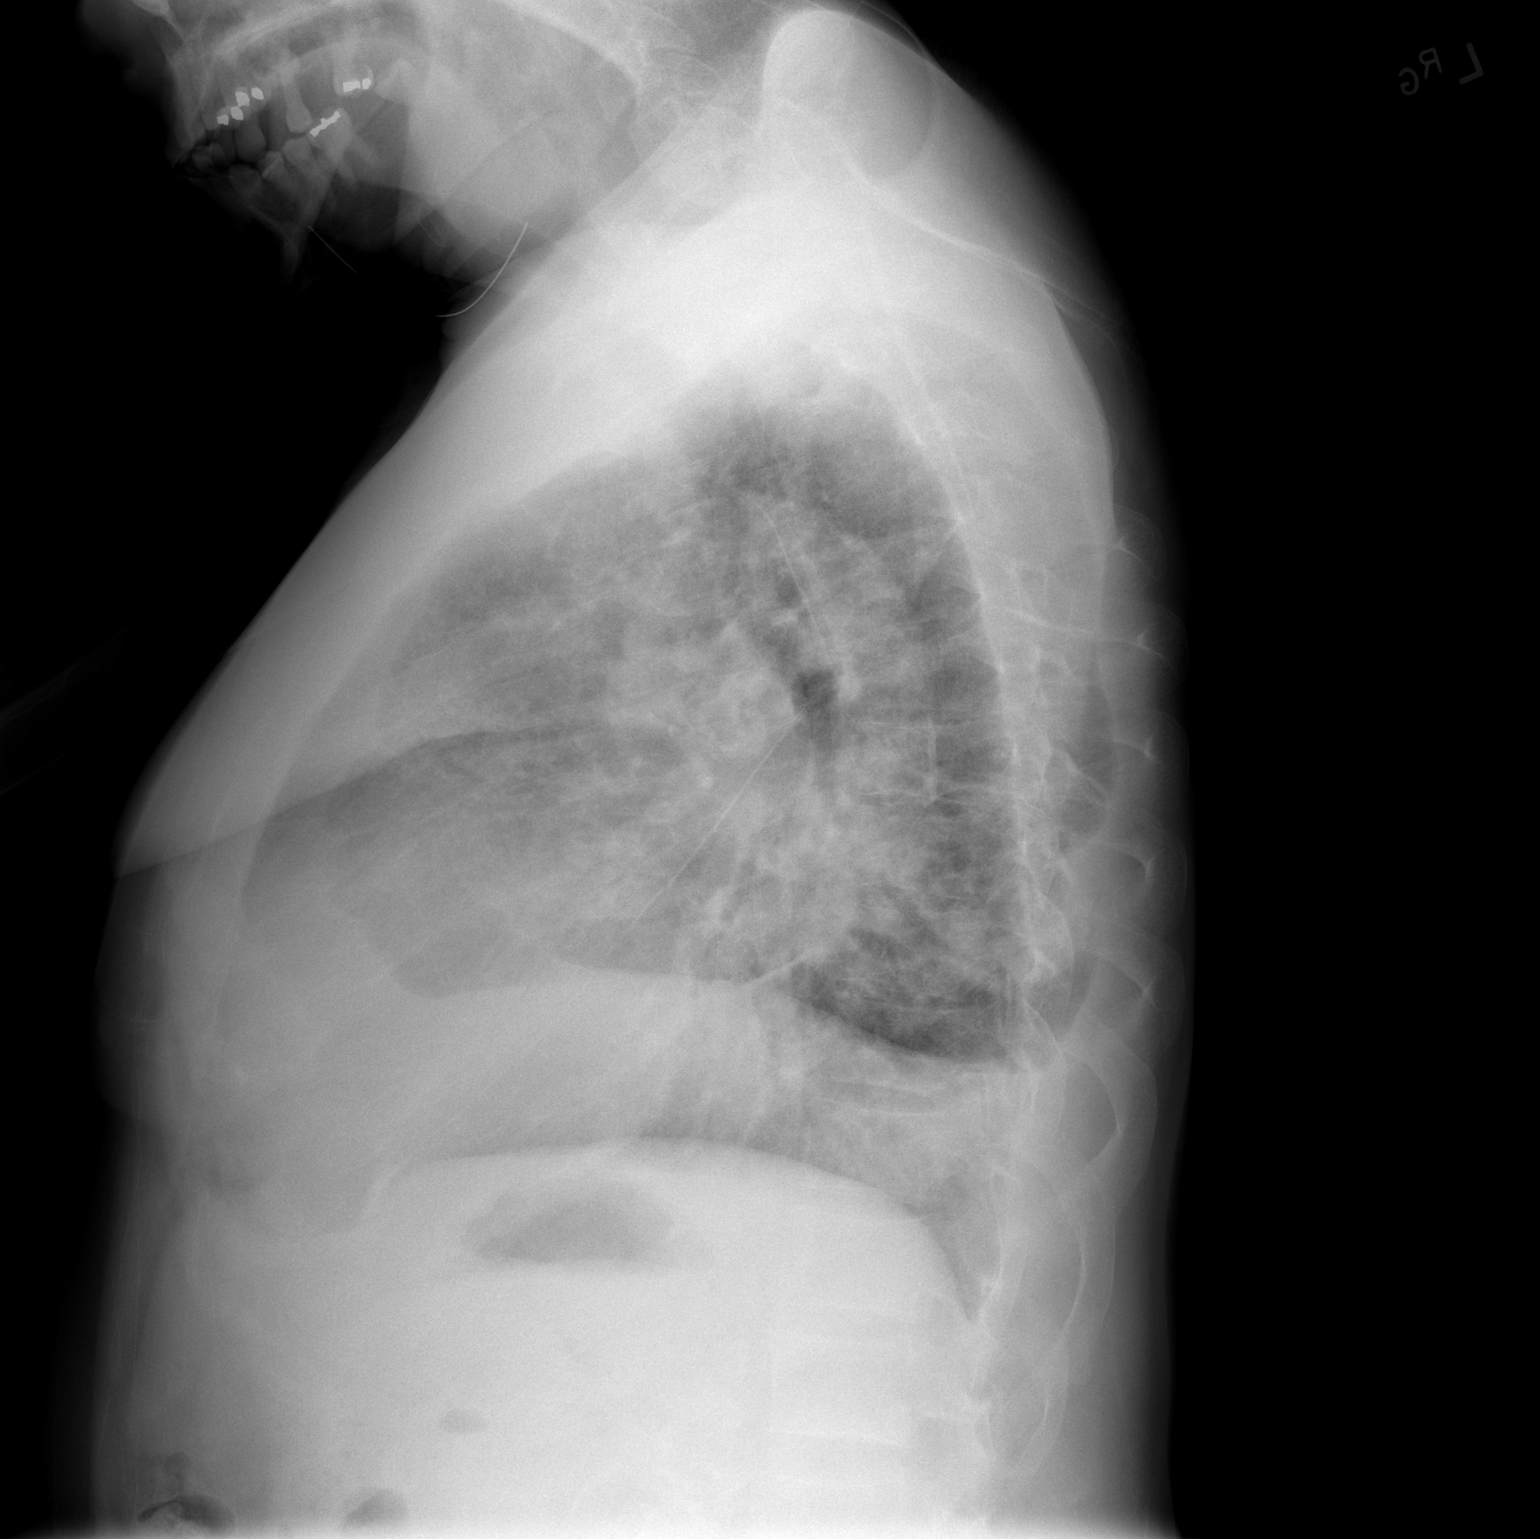

[2 of 2 positions shown; findings below may reference images not displayed]

FINDINGS: Stable cardiomegaly.  Aortic atherosclerosis.

Chronic scarring/fibrosis and pleural thickening at the right lung
base is stable. Coarse lung markings again noted bilaterally
compatible with chronic interstitial lung disease. No pleural
effusion or pneumothorax seen.
IMPRESSION: 1. No active cardiopulmonary disease. No evidence of pneumonia or
pulmonary edema.
2. Stable cardiomegaly.
3. Probable chronic interstitial lung disease.
4. Chronic scarring/fibrosis and pleural thickening at the right
lung base, stable.
5. Aortic atherosclerosis.

## 2020-06-02 ENCOUNTER — Ambulatory Visit: Payer: Self-pay | Admitting: *Deleted

## 2020-06-02 NOTE — Telephone Encounter (Signed)
Patient is calling to report he has been experiencing neck pain/cramping at the end of his dialysis treatments for the past 2 weeks. Patient states he has been having treatments for years and this is a new symptom. Patient reports he had labs 1-2 days ago and everything was normal. Patient states he did go to UC and was given muscle relaxer. Patient was advised to follow up with PCP- no appointment available within disposition and mobile unit is not active at this time. Advised patient would send message to provider to see if he can be scheduled.  Reason for Disposition . Neck pain present > 2 weeks  Answer Assessment - Initial Assessment Questions 1. ONSET: "When did the pain begin?"      2 weeks ago- every time he goes to dialysis he has cramping 2. LOCATION: "Where does it hurt?"      R side 3. PATTERN "Does the pain come and go, or has it been constant since it started?"      Comes and goes- patient did go to UC for this- was given muscle relaxor 4. SEVERITY: "How bad is the pain?"  (Scale 1-10; or mild, moderate, severe)   - NO PAIN (0): no pain or only slight stiffness    - MILD (1-3): doesn't interfere with normal activities    - MODERATE (4-7): interferes with normal activities or awakens from sleep    - SEVERE (8-10):  excruciating pain, unable to do any normal activities      No pain 5. RADIATION: "Does the pain go anywhere else, shoot into your arms?"     no 6. CORD SYMPTOMS: "Any weakness or numbness of the arms or legs?"     no 7. CAUSE: "What do you think is causing the neck pain?"     Unsure- normally happens at end of dialysis  8. NECK OVERUSE: "Any recent activities that involved turning or twisting the neck?"     no 9. OTHER SYMPTOMS: "Do you have any other symptoms?" (e.g., headache, fever, chest pain, difficulty breathing, neck swelling)     no 10. PREGNANCY: "Is there any chance you are pregnant?" "When was your last menstrual period?"       n/a  Protocols used:  NECK PAIN OR STIFFNESS-A-AH

## 2020-06-03 NOTE — Telephone Encounter (Signed)
PCP willl be back in the office next week. Please check with PCP to see if this can be an 8:10 or 1:30 patient. Thanks.

## 2020-06-05 NOTE — Telephone Encounter (Signed)
I have not seen this patient in 2 years. He will need an office visit to re establish care. Also needs to let the kidney doctor know about his neck pain if it is only occurring after HD.

## 2020-06-07 NOTE — Telephone Encounter (Signed)
Called patient, no answer, unable to LVM because no VM set up. Was calling to schedule a re-est care office visit with Raul Del and advise patient to follow up with his kidney specialist.

## 2020-07-27 ENCOUNTER — Ambulatory Visit: Payer: Medicare Other | Attending: Nurse Practitioner | Admitting: Nurse Practitioner

## 2020-07-27 ENCOUNTER — Other Ambulatory Visit: Payer: Self-pay | Admitting: Pharmacist

## 2020-07-27 ENCOUNTER — Other Ambulatory Visit: Payer: Self-pay

## 2020-07-27 ENCOUNTER — Encounter: Payer: Self-pay | Admitting: Nurse Practitioner

## 2020-07-27 DIAGNOSIS — Z1211 Encounter for screening for malignant neoplasm of colon: Secondary | ICD-10-CM

## 2020-07-27 DIAGNOSIS — J449 Chronic obstructive pulmonary disease, unspecified: Secondary | ICD-10-CM

## 2020-07-27 DIAGNOSIS — I10 Essential (primary) hypertension: Secondary | ICD-10-CM

## 2020-07-27 DIAGNOSIS — Z114 Encounter for screening for human immunodeficiency virus [HIV]: Secondary | ICD-10-CM

## 2020-07-27 DIAGNOSIS — Z8639 Personal history of other endocrine, nutritional and metabolic disease: Secondary | ICD-10-CM | POA: Diagnosis not present

## 2020-07-27 DIAGNOSIS — Z125 Encounter for screening for malignant neoplasm of prostate: Secondary | ICD-10-CM

## 2020-07-27 DIAGNOSIS — D696 Thrombocytopenia, unspecified: Secondary | ICD-10-CM

## 2020-07-27 MED ORDER — ALBUTEROL SULFATE HFA 108 (90 BASE) MCG/ACT IN AERS
1.0000 | INHALATION_SPRAY | Freq: Four times a day (QID) | RESPIRATORY_TRACT | 2 refills | Status: DC | PRN
  Filled 2020-07-27: qty 18, 25d supply, fill #0

## 2020-07-27 MED ORDER — MONTELUKAST SODIUM 10 MG PO TABS
10.0000 mg | ORAL_TABLET | Freq: Every day | ORAL | 3 refills | Status: DC
  Filled 2020-07-27 – 2020-09-26 (×2): qty 90, 90d supply, fill #0

## 2020-07-27 MED ORDER — FLUTICASONE-SALMETEROL 250-50 MCG/ACT IN AEPB
1.0000 | INHALATION_SPRAY | Freq: Two times a day (BID) | RESPIRATORY_TRACT | 3 refills | Status: DC
  Filled 2020-07-27 – 2020-09-26 (×2): qty 60, 30d supply, fill #0

## 2020-07-27 MED ORDER — ALBUTEROL SULFATE HFA 108 (90 BASE) MCG/ACT IN AERS
2.0000 | INHALATION_SPRAY | Freq: Four times a day (QID) | RESPIRATORY_TRACT | 2 refills | Status: DC | PRN
  Filled 2020-07-27 – 2020-09-26 (×2): qty 8.5, 25d supply, fill #0

## 2020-07-27 NOTE — Progress Notes (Signed)
Virtual Visit via Telephone Note Due to national recommendations of social distancing due to [place] 19, telehealth visit is felt to be most appropriate for this patient at this time.  I discussed the limitations, risks, security and privacy concerns of performing an evaluation and management service by telephone and the availability of in person appointments. I also discussed with the patient that there may be a patient responsible charge related to this service. The patient expressed understanding and agreed to proceed.    I connected with Nathaniel White on 07/27/20  at   3:50 PM EDT  EDT by telephone and verified that I am speaking with the correct person using two identifiers.  Location of Patient: Private Residence   Location of Provider: [place] and [place] participating in Telemedicine visit: Geryl Rankins FNP-BC Nathaniel White    History of Present Illness: Telemedicine visit for: Re establish care He has a past medical history of Anemia, Anxiety, Asthma, CHF , Complication from renal dialysis device (11/02/2013), COPD (, Diabetes mellitus without complication, ESOPHAGEAL VARICES (10/04/2008), ESRD, Hemiparesis due to old stroke, Hypertension, LV dysfunction, Memory loss due to medical condition, MR, Peripheral vascular disease, Has not been seen by me in 2 years.    In regard to DM diagnosis patient states he had a kidney transplant in 2011 and thereafter was diagnosed with DM. The kidney was rejected in 2012 and he went back on HD. After that he no longer had elevated glucose readings or DM. His HD days are T-Th-Sat. Lab Results  Component Value Date   HGBA1C 5.4 08/13/2011      Smoked ciagrettes for over 30 years. Stopped smoking several years ago.  Checks blood pressure at home with average readings: 130/60-70s. Endorses neck pain which has been improving. Not related to injury or trauma. Denies chest pain, shortness of breath, palpitations,  lightheadedness, dizziness, headaches or BLE edema.   BP Readings from Last 3 Encounters:  11/28/18 113/71  11/21/18 140/74  11/05/18 108/75     Past Medical History:  Diagnosis Date   Anemia    Anxiety    Asthma    CHF (congestive heart failure) ([place])    Complication from renal dialysis device 31/49/7026   Complication of anesthesia 2017   according to pt and spouse pt was moving around and cough while under and pt had difficulty waking up so the anesthesia had to be reversed. and pt admitted to ICU.    COPD (chronic obstructive pulmonary disease) (HCC)    Diabetes mellitus without complication ([place])    Type II - Patient states he does not have diabetes, "they said sometimes when you start dialysis you don't have diabetes any longer"    ESOPHAGEAL VARICES 10/04/2008   Qualifier: Diagnosis of  By: Fuller Plan MD Marijo Conception    ESRD    on HD, M- W- F - [place]   Hemiparesis due to old stroke [facility])    left   Hypertension    LV dysfunction    EF 25-30% by echo 07/2011   Memory loss due to medical condition    due to stroke   MR (mitral regurgitation)    moderate to severe, echo 07/2011   Peripheral vascular disease ([place])    Shortness of breath    Stroke ([place])    TIA's-left sided weakness   Tobacco abuse     Past Surgical History:  Procedure Laterality Date   ABDOMINAL AORTOGRAM W/LOWER EXTREMITY Bilateral  07/21/2018   Procedure: ABDOMINAL AORTOGRAM W/LOWER EXTREMITY;  Surgeon: Waynetta Sandy, MD;  Location: [place] [facility] LAB;  Service: Cardiovascular;  Laterality: Bilateral;   AMPUTATION Left 11/05/2018   Procedure: AMPUTATION SECOND TOE LEFT FOOT;  Surgeon: Waynetta Sandy, MD;  Location: [place];  Service: Vascular;  Laterality: Left;   AV FISTULA PLACEMENT     CARDIAC CATHETERIZATION     [facility]   COLONOSCOPY W/ BIOPSIES AND POLYPECTOMY     FISTULA SUPERFICIALIZATION Left 11/10/2013   Procedure: FISTULA PLICATION;  Surgeon: Serafina Mitchell,  MD;  Location: [facility] OR;  Service: Vascular;  Laterality: Left;   KIDNEY TRANSPLANT     2011 rejected kidney 2012 back on dialysis   LEFT AND RIGHT HEART CATHETERIZATION WITH CORONARY ANGIOGRAM N/A 10/09/2013   Procedure: LEFT AND RIGHT HEART CATHETERIZATION WITH CORONARY ANGIOGRAM;  Surgeon: Burnell Blanks, MD;  Location: [facility] CATH LAB;  Service: Cardiovascular;  Laterality: N/A;   PERIPHERAL VASCULAR INTERVENTION Left 07/21/2018   Procedure: PERIPHERAL VASCULAR INTERVENTION;  Surgeon: Waynetta Sandy, MD;  Location: [place] CV LAB;  Service: Cardiovascular;  Laterality: Left;  Popliteal   REVISON OF ARTERIOVENOUS FISTULA Left 08/26/2013   Procedure: EXCISION OF ERODED SKIN AND EXPLORATION OF MAIN LEFT UPPER ARM AV FISTULA;  Surgeon: Rosetta Posner, MD;  Location: [place];  Service: Vascular;  Laterality: Left;   REVISON OF ARTERIOVENOUS FISTULA Left 02/05/2014   Procedure: REPAIR OF ARTERIOVENOUS FISTULA ANEURYSM;  Surgeon: Serafina Mitchell, MD;  Location: [place];  Service: Vascular;  Laterality: Left;   SHUNTOGRAM N/A 11/05/2012   Procedure: Fistulogram;  Surgeon: Serafina Mitchell, MD;  Location: [facility] CATH LAB;  Service: Cardiovascular;  Laterality: N/A;   TEE WITHOUT CARDIOVERSION  08/17/2011   Procedure: TRANSESOPHAGEAL ECHOCARDIOGRAM (TEE);  Surgeon: Larey Dresser, MD;  Location: [facility] ENDOSCOPY;  Service: Cardiovascular;  Laterality: N/A;   [place] (VATS)/DECORTICATION  08/10/11    Family History  Problem Relation Age of Onset   Hypertension Mother    Varicose Veins Mother    Diabetes Paternal Grandmother    Cancer Paternal Grandfather    CAD Paternal Uncle     Social History   Socioeconomic History   Marital status: Single    Spouse name: Not on file   Number of children: 1   Years of education: Not on file   Highest education level: Not on file  Occupational History   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  Tobacco Use   Smoking  status: Former    Packs/day: 0.50    Years: 15.00    Pack years: 7.50    Types: Cigarettes    Quit date: 2014    Years since quitting: 8.5   Smokeless tobacco: Never  Vaping Use   Vaping Use: Never used  Substance and Sexual Activity   Alcohol use: No    Alcohol/week: 0.0 standard drinks   Drug use: No   Sexual activity: Not Currently  Other Topics Concern   Not on file  Social History Narrative   Not on file   Social Determinants of Health   Financial Resource Strain: Not on file  Food Insecurity: Not on file  Transportation Needs: Not on file  Physical Activity: Not on file  Stress: Not on file  Social Connections: Not on file     Observations/Objective: Awake, alert and oriented x 3   Review of Systems  Constitutional:  Negative for fever, malaise/fatigue and  weight loss.  HENT: Negative.  Negative for nosebleeds.   Eyes: Negative.  Negative for blurred vision, double vision and photophobia.  Respiratory: Negative.  Negative for cough and shortness of breath.   Cardiovascular: Negative.  Negative for chest pain, palpitations and leg swelling.  Gastrointestinal: Negative.  Negative for heartburn, nausea and vomiting.  Musculoskeletal:  Positive for neck pain. Negative for myalgias.  Neurological: Negative.  Negative for dizziness, focal weakness, seizures and headaches.  Psychiatric/Behavioral: Negative.  Negative for suicidal ideas.    Assessment and Plan: Diagnoses and all orders for this visit:  Primary hypertension -     CMP14+EGFR; Future Continue all antihypertensives as prescribed.  Remember to bring in your blood pressure log with you for your follow up appointment.  DASH/Mediterranean Diets are healthier choices for HTN.    Chronic obstructive pulmonary disease, unspecified COPD type (HCC) -     albuterol (VENTOLIN HFA) 108 (90 Base) MCG/ACT inhaler; Inhale 1-2 puffs into the lungs every 6 (six) hours as needed for wheezing or shortness of breath. -      montelukast (SINGULAIR) 10 MG tablet; Take 1 tablet (10 mg total) by mouth at bedtime. -     fluticasone-salmeterol (ADVAIR DISKUS) 250-50 MCG/ACT AEPB; Inhale 1 puff into the lungs in the morning and at bedtime.  Encounter for screening for HIV -     HIV antibody (with reflex); Future  History of diabetes mellitus, type II -     Hemoglobin A1c; Future -     Ambulatory referral to Ophthalmology Continue blood sugar control as discussed in office today, low carbohydrate diet, and regular physical exercise as tolerated,    Colon cancer screening -     Ambulatory referral to Gastroenterology  Thrombocytopenia ([place]) -     CBC with Differential; Future     Follow Up Instructions Return in about 7 weeks (around 09/13/2020).     I discussed the assessment and treatment plan with the patient. The patient was provided an opportunity to ask questions and all were answered. The patient agreed with the plan and demonstrated an understanding of the instructions.   The patient was advised to call back or seek an in-person evaluation if the symptoms worsen or if the condition fails to improve as anticipated.  I provided 17 minutes of non-face-to-face time during this encounter including median intraservice time, reviewing previous notes, labs, imaging, medications and explaining diagnosis and management.  Gildardo Pounds, FNP-BC

## 2020-07-28 ENCOUNTER — Other Ambulatory Visit: Payer: Self-pay

## 2020-08-04 ENCOUNTER — Other Ambulatory Visit: Payer: Self-pay

## 2020-09-26 ENCOUNTER — Other Ambulatory Visit: Payer: Self-pay

## 2020-09-26 ENCOUNTER — Ambulatory Visit: Payer: Self-pay | Admitting: *Deleted

## 2020-09-26 NOTE — Telephone Encounter (Signed)
Three attempts to reach pt., voice mail is not set up.   Per agent: "Pt called in to speak with provider. Pt says that he is a dialysis pt. Pt says that he has noticed that after completed dialysis his bp readings are lower than usual. Pt says that he also has been experiencing some sob after walking for a while. Pt says that he also has been without his 2 inhalers. Advised pt to contact pharmacy for inhaler refills. Advised by NT that a nurse will call pt back to assist. "

## 2020-09-27 NOTE — Telephone Encounter (Signed)
Called pt. Vm not set up.

## 2020-10-03 ENCOUNTER — Other Ambulatory Visit: Payer: Self-pay

## 2020-12-24 ENCOUNTER — Emergency Department (HOSPITAL_BASED_OUTPATIENT_CLINIC_OR_DEPARTMENT_OTHER)
Admission: EM | Admit: 2020-12-24 | Discharge: 2020-12-24 | Disposition: A | Discharge status: Home / Self Care | Payer: Medicare Other | Attending: Emergency Medicine | Admitting: Emergency Medicine

## 2020-12-24 ENCOUNTER — Other Ambulatory Visit: Payer: Self-pay

## 2020-12-24 ENCOUNTER — Encounter (HOSPITAL_BASED_OUTPATIENT_CLINIC_OR_DEPARTMENT_OTHER): Payer: Self-pay | Admitting: *Deleted

## 2020-12-24 DIAGNOSIS — L089 Local infection of the skin and subcutaneous tissue, unspecified: Secondary | ICD-10-CM | POA: Diagnosis not present

## 2020-12-24 DIAGNOSIS — Z7982 Long term (current) use of aspirin: Secondary | ICD-10-CM | POA: Diagnosis not present

## 2020-12-24 DIAGNOSIS — N186 End stage renal disease: Secondary | ICD-10-CM | POA: Insufficient documentation

## 2020-12-24 DIAGNOSIS — Z87891 Personal history of nicotine dependence: Secondary | ICD-10-CM | POA: Diagnosis not present

## 2020-12-24 DIAGNOSIS — I132 Hypertensive heart and chronic kidney disease with heart failure and with stage 5 chronic kidney disease, or end stage renal disease: Secondary | ICD-10-CM | POA: Insufficient documentation

## 2020-12-24 DIAGNOSIS — I5022 Chronic systolic (congestive) heart failure: Secondary | ICD-10-CM | POA: Diagnosis not present

## 2020-12-24 DIAGNOSIS — J449 Chronic obstructive pulmonary disease, unspecified: Secondary | ICD-10-CM | POA: Diagnosis not present

## 2020-12-24 DIAGNOSIS — E1122 Type 2 diabetes mellitus with diabetic chronic kidney disease: Secondary | ICD-10-CM | POA: Insufficient documentation

## 2020-12-24 DIAGNOSIS — Z7951 Long term (current) use of inhaled steroids: Secondary | ICD-10-CM | POA: Insufficient documentation

## 2020-12-24 DIAGNOSIS — Z992 Dependence on renal dialysis: Secondary | ICD-10-CM | POA: Diagnosis not present

## 2020-12-24 NOTE — ED Provider Notes (Signed)
[facility] EMERGENCY DEPARTMENT Provider Note   CSN: [id number] Arrival date & time: 12/24/20  1518     History Chief Complaint  Patient presents with   Wound Check    Nathaniel White is a 60 y.o. male.  60 yo M with a chief complaints of a possible finger infection.  The patient states that yesterday he went to try and bite his fingernail off and he saw some whitish substance come out around the base.  He then went to urgent care and they performed x-rays and started him on antibiotics.  They called him today and told him that he might have an infection to the bone and that he needed to come immediately to the emergency department.  He denies any fevers or chills.  He did have a chronic wound on that finger that he thinks is still in the lateral aspect of the fifth digit.  He has been going to physical therapy which she is not sure is helping or not.  He denies recurrent trauma to the area.  The history is provided by the patient.  Wound Check Pertinent negatives include no chest pain, no abdominal pain, no headaches and no shortness of breath.  Illness Severity:  Moderate Onset quality:  Gradual Duration:  1 day Timing:  Constant Progression:  Unchanged Chronicity:  New Associated symptoms: no abdominal pain, no chest pain, no congestion, no diarrhea, no fever, no headaches, no myalgias, no rash, no shortness of breath and no vomiting       Past Medical History:  Diagnosis Date   Anemia    Anxiety    Asthma    CHF (congestive heart failure) ([place])    Complication from renal dialysis device 94/70/9628   Complication of anesthesia 2017   according to pt and spouse pt was moving around and cough while under and pt had difficulty waking up so the anesthesia had to be reversed. and pt admitted to ICU.    COPD (chronic obstructive pulmonary disease) (HCC)    Diabetes mellitus without complication ([place])    Type II - Patient states he does not have diabetes, "they said  sometimes when you start dialysis you don't have diabetes any longer"    ESOPHAGEAL VARICES 10/04/2008   Qualifier: Diagnosis of  By: Fuller Plan MD Marijo Conception    ESRD    on HD, M- W- F - [place]   Hemiparesis due to old stroke [facility])    left   Hypertension    LV dysfunction    EF 25-30% by echo 07/2011   Memory loss due to medical condition    due to stroke   MR (mitral regurgitation)    moderate to severe, echo 07/2011   Peripheral vascular disease ([place])    Shortness of breath    Stroke ([place])    TIA's-left sided weakness   Tobacco abuse     Patient Active Problem List   Diagnosis Date Noted   Pressure ulcer of ankle 09/01/2018   Thrombocytopenia ([place]) 01/17/2015   Transfusion history 01/17/2015   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   Cough 36/62/9476   CHF, systolic dysfunction 54/65/0354   Chronic obstructive pulmonary disease ([place]) 07/06/2012   Cerebral embolism with cerebral infarction ([place]) 08/13/2011   ESRD (end stage renal disease) on dialysis ([place]) 03/13/2011   HTN (hypertension) 03/13/2011   H/O: CVA (cardiovascular accident) 03/13/2011   Anemia 03/13/2011   Fluid overload 03/13/2011   History of nonadherence to medical  treatment 03/13/2011   Tobacco abuse 03/13/2011   HEMATOCHEZIA 10/04/2008    Past Surgical History:  Procedure Laterality Date   ABDOMINAL AORTOGRAM W/LOWER EXTREMITY Bilateral 07/21/2018   Procedure: ABDOMINAL AORTOGRAM W/LOWER EXTREMITY;  Surgeon: Waynetta Sandy, MD;  Location: [place] CV LAB;  Service: Cardiovascular;  Laterality: Bilateral;   AMPUTATION Left 11/05/2018   Procedure: AMPUTATION SECOND TOE LEFT FOOT;  Surgeon: Waynetta Sandy, MD;  Location: [facility];  Service: Vascular;  Laterality: Left;   AV FISTULA PLACEMENT     CARDIAC CATHETERIZATION     [facility]   COLONOSCOPY W/ BIOPSIES AND POLYPECTOMY     FISTULA SUPERFICIALIZATION Left 11/10/2013   Procedure: FISTULA PLICATION;  Surgeon:  Serafina Mitchell, MD;  Location: [facility] OR;  Service: Vascular;  Laterality: Left;   KIDNEY TRANSPLANT     2011 rejected kidney 2012 back on dialysis   LEFT AND RIGHT HEART CATHETERIZATION WITH CORONARY ANGIOGRAM N/A 10/09/2013   Procedure: LEFT AND RIGHT HEART CATHETERIZATION WITH CORONARY ANGIOGRAM;  Surgeon: Burnell Blanks, MD;  Location: [facility] CATH LAB;  Service: Cardiovascular;  Laterality: N/A;   PERIPHERAL VASCULAR INTERVENTION Left 07/21/2018   Procedure: PERIPHERAL VASCULAR INTERVENTION;  Surgeon: Waynetta Sandy, MD;  Location: [place] CV LAB;  Service: Cardiovascular;  Laterality: Left;  Popliteal   REVISON OF ARTERIOVENOUS FISTULA Left 08/26/2013   Procedure: EXCISION OF ERODED SKIN AND EXPLORATION OF MAIN LEFT UPPER ARM AV FISTULA;  Surgeon: Rosetta Posner, MD;  Location: [place];  Service: Vascular;  Laterality: Left;   REVISON OF ARTERIOVENOUS FISTULA Left 02/05/2014   Procedure: REPAIR OF ARTERIOVENOUS FISTULA ANEURYSM;  Surgeon: Serafina Mitchell, MD;  Location: [place];  Service: Vascular;  Laterality: Left;   SHUNTOGRAM N/A 11/05/2012   Procedure: Fistulogram;  Surgeon: Serafina Mitchell, MD;  Location[facility] CATH LAB;  Service: Cardiovascular;  Laterality: N/A;   TEE WITHOUT CARDIOVERSION  08/17/2011   Procedure: TRANSESOPHAGEAL ECHOCARDIOGRAM (TEE);  Surgeon: Larey Dresser, MD;  Location: [facility] ENDOSCOPY;  Service: Cardiovascular;  Laterality: N/A;   VIDEO ASSISTED THORACOSCOPY (VATS)/DECORTICATION  08/10/11       Family History  Problem Relation Age of Onset   Hypertension Mother    Varicose Veins Mother    Diabetes Paternal Grandmother    Cancer Paternal Grandfather    CAD Paternal Uncle     Social History   Tobacco Use   Smoking status: Former    Packs/day: 0.50    Years: 15.00    Pack years: 7.50    Types: Cigarettes    Quit date: 2014    Years since quitting: 8.9   Smokeless tobacco: Never  Vaping Use   Vaping Use: Never used  Substance Use Topics    Alcohol use: No    Alcohol/week: 0.0 standard drinks   Drug use: No    Home Medications Prior to Admission medications   Medication Sig Start Date End Date Taking? Authorizing Provider  acetaminophen (TYLENOL) 500 MG tablet Take 1 tablet (500 mg total) by mouth every 6 (six) hours as needed. 04/11/18   Zigmund Gottron, NP  albuterol (PROAIR HFA) 108 (90 Base) MCG/ACT inhaler Inhale 2 puffs into the lungs every 6 (six) hours as needed for wheezing or shortness of breath. 07/27/20   Charlott Rakes, MD  aspirin EC 81 MG tablet Take 81 mg by mouth 3 (three) times a week.    [provider]  fluticasone-salmeterol (ADVAIR DISKUS) 250-50 MCG/ACT AEPB Inhale 1 puff into  the lungs in the morning and at bedtime. 07/27/20 10/25/20  Gildardo Pounds, NP  glucose blood (ONE TOUCH ULTRA TEST) test strip  12/26/09   [provider]  montelukast (SINGULAIR) 10 MG tablet Take 1 tablet (10 mg total) by mouth at bedtime. 07/27/20 10/25/20  Gildardo Pounds, NP    Allergies    Codeine, Other, Dextromethorphan-guaifenesin, Iron dextran, and Morphine and related  Review of Systems   Review of Systems  Constitutional:  Negative for chills and fever.  HENT:  Negative for congestion and facial swelling.   Eyes:  Negative for discharge and visual disturbance.  Respiratory:  Negative for shortness of breath.   Cardiovascular:  Negative for chest pain and palpitations.  Gastrointestinal:  Negative for abdominal pain, diarrhea and vomiting.  Musculoskeletal:  Negative for arthralgias and myalgias.  Skin:  Positive for wound. Negative for color change and rash.  Neurological:  Negative for tremors, syncope and headaches.  Psychiatric/Behavioral:  Negative for confusion and dysphoric mood.    Physical Exam Updated Vital Signs BP 98/69 (BP Location: Right Arm)    Pulse 85    Temp 97.9 F (36.6 C) (Oral)    Resp 16    Ht 5\' 9"  (1.753 m)    Wt 81.6 kg    SpO2 92%    BMI 26.58 kg/m   Physical  Exam Vitals and nursing note reviewed.  Constitutional:      Appearance: He is well-developed.  HENT:     Head: Normocephalic and atraumatic.  Eyes:     Pupils: Pupils are equal, round, and reactive to light.  Neck:     Vascular: No JVD.  Cardiovascular:     Rate and Rhythm: Normal rate and regular rhythm.     Heart sounds: No murmur heard.   No friction rub. No gallop.  Pulmonary:     Effort: No respiratory distress.     Breath sounds: No wheezing.  Abdominal:     General: There is no distension.     Tenderness: There is no abdominal tenderness. There is no guarding or rebound.  Musculoskeletal:        General: Normal range of motion.     Cervical back: Normal range of motion and neck supple.     Comments: Chronic looking fungal infection to the left fifth nail.  I am unable to express any discharge from the area around the fingernail.  There is no fluctuance no erythema no induration no wound.  He has some chronic flexion deformity.  Cap refill less than 2 seconds.  Intact sensation to light touch.  Skin:    Coloration: Skin is not pale.     Findings: No rash.  Neurological:     Mental Status: He is alert and oriented to person, place, and time.  Psychiatric:        Behavior: Behavior normal.    ED Results / Procedures / Treatments   Labs (all labs ordered are listed, but only abnormal results are displayed) Labs Reviewed - No data to display  EKG None  Radiology No results found.  Procedures Procedures   Medications Ordered in ED Medications - No data to display  ED Course  I have reviewed the triage vital signs and the nursing notes.  Pertinent labs & imaging results that were available during my care of the patient were reviewed by me and considered in my medical decision making (see chart for details).    MDM Rules/Calculators/A&P  60 yo M with a chief complaints of concern for a infection to his left fifth digit.  He was trying  to bite the nail a couple days ago and noticed a small amount of whitish substance that came out.  On my exam there is no obvious infection.  He was sent from urgent care due to an x-ray finding that was done yesterday concerning for osteo-.  My suspicion for acute infection is extremely low.  I will have him continue the oral antibiotics that was given at urgent care.  We will have him call his hand surgeon on Monday.  3:44 PM:  I have discussed the diagnosis/risks/treatment options with the patient and believe the pt to be eligible for discharge home to follow-up with PCP. We also discussed returning to the ED immediately if new or worsening sx occur. We discussed the sx which are most concerning (e.g., sudden worsening pain, fever, inability to tolerate by mouth) that necessitate immediate return. Medications administered to the patient during their visit and any new prescriptions provided to the patient are listed below.  Medications given during this visit Medications - No data to display   The patient appears reasonably screen and/or stabilized for discharge and I doubt any other medical condition or other [facility] requiring further screening, evaluation, or treatment in the ED at this time prior to discharge.      Final Clinical Impression(s) / ED Diagnoses Final diagnoses:  Finger infection    Rx / DC Orders ED Discharge Orders     None        Deno Etienne, DO 12/24/20 1544

## 2020-12-24 NOTE — ED Triage Notes (Signed)
Pt reports injury to left pinky ~ 2 weeks ago. Yesterday he went to cut the fingernail and noticed pus drainage. He was seen at Urgent care and advised to come to ED. He states he had dialysis today prior to coming to ED

## 2020-12-24 NOTE — Discharge Instructions (Signed)
Return for redness, drainage, fever

## 2020-12-26 ENCOUNTER — Ambulatory Visit: Payer: Self-pay

## 2020-12-26 NOTE — Telephone Encounter (Signed)
°  Chief Complaint: dizziness Symptoms: dizziness Frequency: this morning Pertinent Negatives: Patient denies any other symptoms Disposition: [] ED /[] Urgent Care (no appt availability in office) / [x] Appointment(In office/virtual)/ []  [place] [facility]/ [] Home Care/ [] Refused Recommended Disposition  Additional Notes: appt scheduled for 01/25/21 at 1410. Advised pt he can f/up with UC if needed until appt. Pt states he will do that to make sure everything good with finger. Pt is abx for possible finger infection and said he woke up feeling dizzy this morning but it has went away and fine during call. Pt was at dialysis as well.  Answer Assessment - Initial Assessment Questions 1. DESCRIPTION: "Describe your dizziness."     Feels fine now 2. LIGHTHEADED: "Do you feel lightheaded?" (e.g., somewhat faint, woozy, weak upon standing)     Dizzy headed this morning 3. VERTIGO: "Do you feel like either you or the room is spinning or tilting?" (i.e. vertigo)     No 4. SEVERITY: "How bad is it?"  "Do you feel like you are going to faint?" "Can you stand and walk?"   - MILD: Feels slightly dizzy, but walking normally.   - MODERATE: Feels unsteady when walking, but not falling; interferes with normal activities (e.g., school, work).   - SEVERE: Unable to walk without falling, or requires assistance to walk without falling; feels like passing out now.      mild 5. ONSET:  "When did the dizziness begin?"     Only started this morning   10. OTHER SYMPTOMS: "Do you have any other symptoms?" (e.g., fever, chest pain, vomiting, diarrhea, bleeding)       no  Protocols used: Dizziness - Lightheadedness-A-AH

## 2020-12-26 NOTE — Telephone Encounter (Signed)
Scheduled apt for concerns with finger and ATB.  Has had diarrhea since on ATB, had some dizziness this morning but better.

## 2020-12-27 ENCOUNTER — Ambulatory Visit (INDEPENDENT_AMBULATORY_CARE_PROVIDER_SITE_OTHER): Payer: Medicare Other

## 2020-12-27 ENCOUNTER — Other Ambulatory Visit: Payer: Self-pay

## 2020-12-27 ENCOUNTER — Ambulatory Visit (INDEPENDENT_AMBULATORY_CARE_PROVIDER_SITE_OTHER): Payer: Medicare Other | Admitting: Nurse Practitioner

## 2020-12-27 VITALS — BP 128/69 | HR 89 | Resp 16

## 2020-12-27 DIAGNOSIS — M79642 Pain in left hand: Secondary | ICD-10-CM | POA: Diagnosis not present

## 2020-12-27 NOTE — Patient Instructions (Addendum)
Left fifth finger infection:  Concerned for osteomyelitis   Will order x ray  Please complete current antibiotics  May start probiotic  Will order bacitracin  Follow up:  Follow up with Dr. Redmond Pulling in 2 weeks or sooner

## 2020-12-27 NOTE — Progress Notes (Signed)
@Patient  ID: Nathaniel White, male    DOB: [birth year], 60 y.o.   MRN: [id number]  Chief Complaint  Patient presents with   Diarrhea   Hand Pain    Referring provider: Gildardo Pounds, NP  HPI  Patient presents today hand pain.  Patient states that he has been having issues with his fifth finger on his left hand.  He has noticed purulent drainage around his nailbed.  Patient injured this finger in October and has been following with orthopedics for this.  It was noted in a previous scan in October that there may have been some osteomyelitis involved.  He has been to orthopedics in the ED several times for this issue.  He is currently on clindamycin for this.  He states that the medication is helping but has caused him to have some diarrhea.  He does not want to stop medication at this point.  We discussed that he can stop the medication if the diarrhea worsens.  He may want to try a probiotic.  We will repeat x-ray to his left hand today. Denies f/c/s, n/v/d, hemoptysis, PND, chest pain or edema.   Allergies  Allergen Reactions   Codeine Shortness Of Breath   Other Other (See Comments) and Shortness Of Breath   Dextromethorphan-Guaifenesin Other (See Comments)   Iron Dextran Other (See Comments)   Morphine And Related Other (See Comments)    Immunization History  Administered Date(s) Administered   Influenza,inj,quad, With Preservative 10/03/2017   Pneumococcal Polysaccharide-23 07/02/2015    Past Medical History:  Diagnosis Date   Anemia    Anxiety    Asthma    CHF (congestive heart failure) ([place])    Complication from renal dialysis device 93/26/7124   Complication of anesthesia 2017   according to pt and spouse pt was moving around and cough while under and pt had difficulty waking up so the anesthesia had to be reversed. and pt admitted to ICU.    COPD (chronic obstructive pulmonary disease) (HCC)    Diabetes mellitus without complication ([place])    Type II - Patient  states he does not have diabetes, "they said sometimes when you start dialysis you don't have diabetes any longer"    ESOPHAGEAL VARICES 10/04/2008   Qualifier: Diagnosis of  By: Fuller Plan MD Marijo Conception    ESRD    on HD, M- W- F - [facility]   Hemiparesis due to old stroke [facility])    left   Hypertension    LV dysfunction    EF 25-30% by echo 07/2011   Memory loss due to medical condition    due to stroke   MR (mitral regurgitation)    moderate to severe, echo 07/2011   Peripheral vascular disease ([place])    Shortness of breath    Stroke ([place])    TIA's-left sided weakness   Tobacco abuse     Tobacco History: Social History   Tobacco Use  Smoking Status Former   Packs/day: 0.50   Years: 15.00   Pack years: 7.50   Types: Cigarettes   Quit date: 2014   Years since quitting: 8.9  Smokeless Tobacco Never   Counseling given: Not Answered   Outpatient Encounter Medications as of 12/27/2020  Medication Sig   bacitracin 500 UNIT/GM ointment Apply 1 application topically 2 (two) times daily.   acetaminophen (TYLENOL) 500 MG tablet Take 1 tablet (500 mg total) by mouth every 6 (six) hours as needed.   albuterol (PROAIR HFA)  108 (90 Base) MCG/ACT inhaler Inhale 2 puffs into the lungs every 6 (six) hours as needed for wheezing or shortness of breath.   aspirin EC 81 MG tablet Take 81 mg by mouth 3 (three) times a week.   fluticasone-salmeterol (ADVAIR DISKUS) 250-50 MCG/ACT AEPB Inhale 1 puff into the lungs in the morning and at bedtime.   glucose blood (ONE TOUCH ULTRA TEST) test strip    montelukast (SINGULAIR) 10 MG tablet Take 1 tablet (10 mg total) by mouth at bedtime.   No facility-administered encounter medications on file as of 12/27/2020.     Review of Systems  Review of Systems  Constitutional: Negative.   HENT: Negative.    Cardiovascular: Negative.   Gastrointestinal: Negative.   Skin:  Positive for wound (left fifth finger nail bed).  Allergic/Immunologic:  Negative.   Neurological: Negative.   Psychiatric/Behavioral: Negative.        Physical Exam  BP 128/69    Pulse 89    Resp 16    SpO2 98%   Wt Readings from Last 5 Encounters:  12/24/20 180 lb (81.6 kg)  11/28/18 184 lb (83.5 kg)  11/21/18 184 lb 3.2 oz (83.6 kg)  11/05/18 165 lb (74.8 kg)  10/24/18 165 lb (74.8 kg)     Physical Exam Vitals and nursing note reviewed.  Constitutional:      General: He is not in acute distress.    Appearance: He is well-developed.  Cardiovascular:     Rate and Rhythm: Normal rate and regular rhythm.  Pulmonary:     Effort: Pulmonary effort is normal.     Breath sounds: Normal breath sounds.  Skin:    General: Skin is warm and dry.     Comments: Erythematous area to left fifth finger nail bed  Neurological:     Mental Status: He is alert and oriented to person, place, and time.     Lab Results:  CBC    Component Value Date/Time   WBC 5.0 03/03/2018 2228   RBC 4.38 03/03/2018 2228   HGB 14.6 11/05/2018 0721   HGB 11.5 (L) 01/17/2015 1454   HCT 43.0 11/05/2018 0721   HCT 35.7 (L) 01/17/2015 1454   PLT 73 (L) 03/03/2018 2228   PLT 78 (L) 01/17/2015 1454   MCV 99.1 03/03/2018 2228   MCV 96.5 01/17/2015 1454   MCH 31.1 03/03/2018 2228   MCHC 31.3 03/03/2018 2228   RDW 15.7 (H) 03/03/2018 2228   RDW 15.5 (H) 01/17/2015 1454   LYMPHSABS 0.7 (L) 01/17/2015 1454   MONOABS 0.3 01/17/2015 1454   EOSABS 0.5 01/17/2015 1454   BASOSABS 0.0 01/17/2015 1454    BMET    Component Value Date/Time   NA 137 11/05/2018 0721   NA 142 01/17/2015 1454   K 6.5 (HH) 11/05/2018 0721   K 4.2 01/17/2015 1454   CL 101 11/05/2018 0721   CO2 26 03/03/2018 2303   CO2 28 01/17/2015 1454   GLUCOSE 83 11/05/2018 0721   GLUCOSE 96 01/17/2015 1454   BUN 55 (H) 11/05/2018 0721   BUN 46.9 (H) 01/17/2015 1454   CREATININE 6.30 (H) 11/05/2018 0721   CREATININE 9.9 (HH) 01/17/2015 1454   CALCIUM 9.1 03/03/2018 2303   CALCIUM 10.2 01/17/2015 1454    GFRNONAA 4 (L) 03/03/2018 2303   GFRAA 5 (L) 03/03/2018 2303    BNP No results found for: BNP  ProBNP    Component Value Date/Time   PROBNP >70,000.0 (H) 10/06/2013 0140  Imaging: DG Hand Complete Left  Result Date: 12/28/2020 CLINICAL DATA:  Left hand pain. Concern for infection. Patient reports left fifth finger is infected with purulent drainage around nail. EXAM: LEFT HAND - COMPLETE 3+ VIEW COMPARISON:  None. FINDINGS: Prominent osteoporosis/osteopenia. The fifth and to a lesser extent fourth digits are held in flexion on all views. This limits osseous and soft tissue fine detail. Small focus of air in the soft tissues about the distal fifth digit may be in the region of the nail bed. There is no evidence of associated bony destruction, periosteal reaction, or abnormal bone density, although this region is suboptimally assessed due to contracture. No visualized fracture. Scattered osteoarthritis. Advanced vascular calcifications. No radiopaque foreign body. IMPRESSION: 1. Prominent osteoporosis/osteopenia. 2. Small focus of air in the soft tissues about the distal fifth digit may be in the region of the nail bed, likely corresponding the site of infection. No radiographic findings of osteomyelitis, although evaluation is limited by contractures and osteopenia/osteoporosis. 3. Scattered osteoarthritis. Fourth and fifth digits are held in flexion on all views. 4. Advanced vascular calcifications. Electronically Signed   By: Keith Rake M.D.   On: 12/28/2020 12:45     Assessment & Plan:   Left hand pain Left fifth finger infection:  Concerned for osteomyelitis   Will order x ray  Please complete current antibiotics  May start probiotic  Will order bacitracin  Follow up:  Follow up with Dr. Redmond Pulling in 2 weeks or sooner    Fenton Foy, NP 12/29/2020

## 2020-12-29 ENCOUNTER — Encounter: Payer: Self-pay | Admitting: Nurse Practitioner

## 2020-12-29 DIAGNOSIS — M79642 Pain in left hand: Secondary | ICD-10-CM | POA: Insufficient documentation

## 2020-12-29 MED ORDER — BACITRACIN 500 UNIT/GM EX OINT: 1.0000 "application " | TOPICAL_OINTMENT | Freq: Two times a day (BID) | CUTANEOUS | 0 refills | Status: DC

## 2020-12-29 NOTE — Assessment & Plan Note (Signed)
Left fifth finger infection:  Concerned for osteomyelitis   Will order x ray  Please complete current antibiotics  May start probiotic  Will order bacitracin  Follow up:  Follow up with Dr. Redmond Pulling in 2 weeks or sooner

## 2021-01-11 ENCOUNTER — Encounter: Payer: Self-pay | Admitting: Family Medicine

## 2021-01-11 ENCOUNTER — Ambulatory Visit (INDEPENDENT_AMBULATORY_CARE_PROVIDER_SITE_OTHER): Payer: Medicare Other | Admitting: Family Medicine

## 2021-01-11 ENCOUNTER — Other Ambulatory Visit: Payer: Self-pay

## 2021-01-11 VITALS — BP 95/57 | HR 80 | Temp 97.8°F | Resp 16 | Wt 173.8 lb

## 2021-01-11 DIAGNOSIS — J410 Simple chronic bronchitis: Secondary | ICD-10-CM

## 2021-01-11 DIAGNOSIS — M79642 Pain in left hand: Secondary | ICD-10-CM

## 2021-01-11 MED ORDER — CEPHALEXIN 250 MG PO CAPS: 250.0000 mg | ORAL_CAPSULE | Freq: Two times a day (BID) | ORAL | 0 refills | Status: DC

## 2021-01-11 MED ORDER — ALBUTEROL SULFATE HFA 108 (90 BASE) MCG/ACT IN AERS: 2.0000 | INHALATION_SPRAY | Freq: Four times a day (QID) | RESPIRATORY_TRACT | 2 refills | Status: DC | PRN

## 2021-01-11 NOTE — Progress Notes (Signed)
Patient roommate is here Patient fell outside while playing with a child. Patient hurt left pinky finger.  Patient may need help with nursing aide

## 2021-01-12 ENCOUNTER — Encounter: Payer: Self-pay | Admitting: Family Medicine

## 2021-01-12 NOTE — Progress Notes (Signed)
Established  Patient Office Visit  Subjective:  Patient ID: Nathaniel White, male    DOB: February 17, 1960  Age: 61 y.o. MRN: [id number]  CC:  Chief Complaint  Patient presents with   Follow-up   Hypertension    HPI [name] presents for follow up on finger injury. Patient reports falling and hitting finger. He was seen at [facility] and given xray as well as antibiotic for skin infection. Patient reports that he believes he still has a skin infection. Denies fever/chills.   Past Medical History:  Diagnosis Date   Anemia    Anxiety    Asthma    CHF (congestive heart failure) ([place])    Complication from renal dialysis device 07/01/7626   Complication of anesthesia 2017   according to pt and spouse pt was moving around and cough while under and pt had difficulty waking up so the anesthesia had to be reversed. and pt admitted to ICU.    COPD (chronic obstructive pulmonary disease) (HCC)    Diabetes mellitus without complication ([place])    Type II - Patient states he does not have diabetes, "they said sometimes when you start dialysis you don't have diabetes any longer"    ESOPHAGEAL VARICES 10/04/2008   Qualifier: Diagnosis of  By: Fuller Plan MD Marijo Conception    ESRD    on HD, M- W- F - [facility]   Hemiparesis due to old stroke [facility])    left   Hypertension    LV dysfunction    EF 25-30% by echo 07/2011   Memory loss due to medical condition    due to stroke   MR (mitral regurgitation)    moderate to severe, echo 07/2011   Peripheral vascular disease ([place])    Shortness of breath    Stroke ([place])    TIA's-left sided weakness   Tobacco abuse     Past Surgical History:  Procedure Laterality Date   ABDOMINAL AORTOGRAM W/LOWER EXTREMITY Bilateral 07/21/2018   Procedure: ABDOMINAL AORTOGRAM W/LOWER EXTREMITY;  Surgeon: Waynetta Sandy, MD;  Location: [place] CV LAB;  Service: Cardiovascular;  Laterality: Bilateral;   AMPUTATION Left 11/05/2018   Procedure: AMPUTATION SECOND  TOE LEFT FOOT;  Surgeon: Waynetta Sandy, MD;  Location: [place];  Service: Vascular;  Laterality: Left;   AV FISTULA PLACEMENT     CARDIAC CATHETERIZATION     [place] [facility]   COLONOSCOPY W/ BIOPSIES AND POLYPECTOMY     FISTULA SUPERFICIALIZATION Left 11/10/2013   Procedure: FISTULA PLICATION;  Surgeon: Serafina Mitchell, MD;  Location: [facility] OR;  Service: Vascular;  Laterality: Left;   KIDNEY TRANSPLANT     2011 rejected kidney 2012 back on dialysis   LEFT AND RIGHT HEART CATHETERIZATION WITH CORONARY ANGIOGRAM N/A 10/09/2013   Procedure: LEFT AND RIGHT HEART CATHETERIZATION WITH CORONARY ANGIOGRAM;  Surgeon: Burnell Blanks, MD;  Location: [facility] CATH LAB;  Service: Cardiovascular;  Laterality: N/A;   PERIPHERAL VASCULAR INTERVENTION Left 07/21/2018   Procedure: PERIPHERAL VASCULAR INTERVENTION;  Surgeon: Waynetta Sandy, MD;  Location: [place] CV LAB;  Service: Cardiovascular;  Laterality: Left;  Popliteal   REVISON OF ARTERIOVENOUS FISTULA Left 08/26/2013   Procedure: EXCISION OF ERODED SKIN AND EXPLORATION OF MAIN LEFT UPPER ARM AV FISTULA;  Surgeon: Rosetta Posner, MD;  Location: [place];  Service: Vascular;  Laterality: Left;   REVISON OF ARTERIOVENOUS FISTULA Left 02/05/2014   Procedure: REPAIR OF ARTERIOVENOUS FISTULA ANEURYSM;  Surgeon: Serafina Mitchell, MD;  Location: [facility] OR;  Service: Vascular;  Laterality: Left;   SHUNTOGRAM N/A 11/05/2012   Procedure: Fistulogram;  Surgeon: Serafina Mitchell, MD;  Location: [facility] CATH LAB;  Service: Cardiovascular;  Laterality: N/A;   TEE WITHOUT CARDIOVERSION  08/17/2011   Procedure: TRANSESOPHAGEAL ECHOCARDIOGRAM (TEE);  Surgeon: Larey Dresser, MD;  Location: [facility] ENDOSCOPY;  Service: Cardiovascular;  Laterality: N/A;   [place] (VATS)/DECORTICATION  08/10/11    Family History  Problem Relation Age of Onset   Hypertension Mother    Varicose Veins Mother    Diabetes Paternal Grandmother    Cancer Paternal Grandfather     CAD Paternal Uncle     Social History   Socioeconomic History   Marital status: Single    Spouse name: Not on file   Number of children: 1   Years of education: Not on file   Highest education level: Not on file  Occupational History   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  Tobacco Use   Smoking status: Former    Packs/day: 0.50    Years: 15.00    Pack years: 7.50    Types: Cigarettes    Quit date: 2014    Years since quitting: 9.0   Smokeless tobacco: Never  Vaping Use   Vaping Use: Never used  Substance and Sexual Activity   Alcohol use: No    Alcohol/week: 0.0 standard drinks   Drug use: No   Sexual activity: Not Currently  Other Topics Concern   Not on file  Social History Narrative   Not on file   Social Determinants of Health   Financial Resource Strain: Not on file  Food Insecurity: Not on file  Transportation Needs: Not on file  Physical Activity: Not on file  Stress: Not on file  Social Connections: Not on file  Intimate Partner Violence: Not on file    ROS Review of Systems  Constitutional:  Negative for chills and fever.  All other systems reviewed and are negative.  Objective:   Today's Vitals: BP (!) 95/57    Pulse 80    Temp 97.8 F (36.6 C) (Oral)    Resp 16    Wt 173 lb 12.8 oz (78.8 kg)    SpO2 93%    BMI 25.67 kg/m   Physical Exam Vitals and nursing note reviewed.  Constitutional:      General: He is not in acute distress.    Appearance: He is well-developed.  Cardiovascular:     Rate and Rhythm: Normal rate and regular rhythm.  Pulmonary:     Effort: Pulmonary effort is normal.     Breath sounds: Normal breath sounds.  Skin:    General: Skin is warm and dry.     Comments: Erythematous area to left fifth finger nail bed  Neurological:     Mental Status: He is alert and oriented to person, place, and time.    Assessment & Plan:   1. Left hand pain Keflex prescribed. monitor  2. Simple chronic  bronchitis (HCC) Albuterol refilled. Appears stable  Outpatient Encounter Medications as of 01/11/2021  Medication Sig   acetaminophen (TYLENOL) 500 MG tablet Take 1 tablet (500 mg total) by mouth every 6 (six) hours as needed.   aspirin EC 81 MG tablet Take 81 mg by mouth 3 (three) times a week.   cephALEXin (KEFLEX) 250 MG capsule Take 1 capsule (250 mg total) by mouth 2 (two) times daily.   [DISCONTINUED] albuterol (PROAIR HFA)  108 (90 Base) MCG/ACT inhaler Inhale 2 puffs into the lungs every 6 (six) hours as needed for wheezing or shortness of breath.   albuterol (PROAIR HFA) 108 (90 Base) MCG/ACT inhaler Inhale 2 puffs into the lungs every 6 (six) hours as needed for wheezing or shortness of breath.   bacitracin 500 UNIT/GM ointment Apply 1 application topically 2 (two) times daily. (Patient not taking: Reported on 01/11/2021)   fluticasone-salmeterol (ADVAIR DISKUS) 250-50 MCG/ACT AEPB Inhale 1 puff into the lungs in the morning and at bedtime.   glucose blood test strip  (Patient not taking: Reported on 01/11/2021)   montelukast (SINGULAIR) 10 MG tablet Take 1 tablet (10 mg total) by mouth at bedtime.   No facility-administered encounter medications on file as of 01/11/2021.    Follow-up: No follow-ups on file.   Becky Sax, MD

## 2021-01-25 ENCOUNTER — Ambulatory Visit: Payer: Medicare Other | Attending: Physician Assistant | Admitting: Physician Assistant

## 2021-01-25 ENCOUNTER — Encounter: Payer: Self-pay | Admitting: Physician Assistant

## 2021-01-25 ENCOUNTER — Other Ambulatory Visit: Payer: Self-pay

## 2021-01-25 VITALS — BP 102/72 | HR 82 | Resp 16

## 2021-01-25 DIAGNOSIS — M79642 Pain in left hand: Secondary | ICD-10-CM

## 2021-01-25 MED ORDER — CEPHALEXIN 500 MG PO CAPS
500.0000 mg | ORAL_CAPSULE | Freq: Three times a day (TID) | ORAL | 0 refills | Status: DC
  Filled 2021-01-25: qty 30, 10d supply, fill #0

## 2021-01-25 NOTE — Progress Notes (Signed)
Patient ID: Nathaniel White, male   DOB: 09-05-1960, 61 y.o.   MRN: [id number]   Nathaniel White, is a 61 y.o. male  WIO:[id number]  [id number]:[id number]  DOB - 09-Mar-1960  Chief Complaint  Patient presents with   Hand Pain    Pinky finger infection (left)       Subjective:   Nathaniel White is a 61 y.o. male here today bc his finger on his L hand is continuing to give him problems.  He says it first became problematic about 3-4 months ago when he was playing with his grandchild and seemed to twist it.  He does not remember having trouble with it until after that but does not remember it hurting at the time. It was xrayed about 1 month ago and did not show osteomyelitis.  Today he says it is tender and he has decreased ROM.  Just completed antibiotics again a few days a go.  H/o stroke with residual L sided weakness.  No fever.  See multiple visits in chart No problems updated.  ALLERGIES: Allergies  Allergen Reactions   Codeine Shortness Of Breath   Other Other (See Comments) and Shortness Of Breath   Dextromethorphan-Guaifenesin Other (See Comments)   Iron Dextran Other (See Comments)   Morphine And Related Other (See Comments)    PAST MEDICAL HISTORY: Past Medical History:  Diagnosis Date   Anemia    Anxiety    Asthma    CHF (congestive heart failure) ([place])    Complication from renal dialysis device 97/98/9211   Complication of anesthesia 2017   according to pt and spouse pt was moving around and cough while under and pt had difficulty waking up so the anesthesia had to be reversed. and pt admitted to ICU.    COPD (chronic obstructive pulmonary disease) (HCC)    Diabetes mellitus without complication ([place])    Type II - Patient states he does not have diabetes, "they said sometimes when you start dialysis you don't have diabetes any longer"    ESOPHAGEAL VARICES 10/04/2008   Qualifier: Diagnosis of  By: Fuller Plan MD Marijo Conception    ESRD    on HD, M- W- F - [facility]   Hemiparesis  due to old stroke [facility])    left   Hypertension    LV dysfunction    EF 25-30% by echo 07/2011   Memory loss due to medical condition    due to stroke   MR (mitral regurgitation)    moderate to severe, echo 07/2011   Peripheral vascular disease ([place])    Shortness of breath    Stroke ([place])    TIA's-left sided weakness   Tobacco abuse     MEDICATIONS AT HOME: Prior to Admission medications   Medication Sig Start Date End Date Taking? Authorizing Provider  acetaminophen (TYLENOL) 500 MG tablet Take 1 tablet (500 mg total) by mouth every 6 (six) hours as needed. 04/11/18  Yes Burky, Malachy Moan, NP  albuterol (PROAIR HFA) 108 (90 Base) MCG/ACT inhaler Inhale 2 puffs into the lungs every 6 (six) hours as needed for wheezing or shortness of breath. 01/11/21  Yes Dorna Mai, MD  aspirin EC 81 MG tablet Take 81 mg by mouth 3 (three) times a week.   Yes [provider]  bacitracin 500 UNIT/GM ointment Apply 1 application topically 2 (two) times daily. 12/29/20  Yes Fenton Foy, NP  cephALEXin (KEFLEX) 500 MG capsule Take 1 capsule (500 mg total) by mouth 3 (  three) times daily. 01/25/21  Yes Freeman Caldron M, PA-C  glucose blood test strip  12/26/09  Yes [provider]  fluticasone-salmeterol (ADVAIR DISKUS) 250-50 MCG/ACT AEPB Inhale 1 puff into the lungs in the morning and at bedtime. 07/27/20 10/25/20  Gildardo Pounds, NP  montelukast (SINGULAIR) 10 MG tablet Take 1 tablet (10 mg total) by mouth at bedtime. 07/27/20 10/25/20  Gildardo Pounds, NP    ROS: Neg HEENT Neg resp Neg cardiac Neg GI Neg GU Neg psych Neg neuro  Objective:   Vitals:   01/25/21 1411  BP: 102/72  Pulse: 82  Resp: 16  SpO2: 98%   Exam General appearance : Awake, alert, not in any distress. Speech Clear. Not toxic looking-in wheelchair.  Appears older than stated age.  Poor historian.   HEENT: Atraumatic and Normocephalic Chest: Good air entry bilaterally, CTAB.  No  rales/rhonchi/wheezing CVS: S1 S2 regular, no murmurs.  Extremities: B/L Lower Ext shows no edema, both legs are warm to touch Neurology: Awake alert, and oriented X 3, CN II-XII intact, Non focal Skin: No Rash L hand-decreased ROM.  5th digit with minimal swelling, no definite boil or infection.  TTP over DIP and unable to extend passively or actively  Data Review Lab Results  Component Value Date   HGBA1C 5.4 08/13/2011    Assessment & Plan   1. Left hand pain Will cover for infection.  He may be developing a dupuytren's contracture. - cephALEXin (KEFLEX) 500 MG capsule; Take 1 capsule (500 mg total) by mouth 3 (three) times daily.  Dispense: 30 capsule; Refill: 0 - Ambulatory referral to Hand Surgery  Patient have been counseled extensively about nutrition and exercise. Other issues discussed during this visit include: low cholesterol diet, weight control and daily exercise, foot care, annual eye examinations at Ophthalmology, importance of adherence with medications and regular follow-up. We also discussed long term complications of uncontrolled diabetes and hypertension.   Return in about 2 months (around 03/25/2021) for PCP for chronic conditions.  The patient was given clear instructions to go to ER or return to medical center if symptoms don't improve, worsen or new problems develop. The patient verbalized understanding. The patient was told to call to get lab results if they haven't heard anything in the next week.      Freeman Caldron, PA-C [facility] [place], [place]   01/25/2021, 2:27 PM

## 2021-01-25 NOTE — Patient Instructions (Signed)
If you develop redness, worse swelling, increase in pain or fever, go to the ED

## 2021-02-03 ENCOUNTER — Encounter: Payer: Self-pay | Admitting: Orthopedic Surgery

## 2021-02-03 ENCOUNTER — Ambulatory Visit (INDEPENDENT_AMBULATORY_CARE_PROVIDER_SITE_OTHER): Payer: Medicare Other

## 2021-02-03 ENCOUNTER — Ambulatory Visit (INDEPENDENT_AMBULATORY_CARE_PROVIDER_SITE_OTHER): Payer: Medicare Other | Admitting: Orthopedic Surgery

## 2021-02-03 ENCOUNTER — Other Ambulatory Visit: Payer: Self-pay

## 2021-02-03 DIAGNOSIS — M79642 Pain in left hand: Secondary | ICD-10-CM

## 2021-02-03 DIAGNOSIS — L03012 Cellulitis of left finger: Secondary | ICD-10-CM | POA: Diagnosis not present

## 2021-02-03 NOTE — Progress Notes (Signed)
Office Visit Note   Patient: Nathaniel White           Date of Birth: [birth year]           MRN: [id number] Visit Date: 02/03/2021              Requested by: Argentina Donovan, PA-C [place],  [place] [address] PCP: Dorna Mai, MD   Assessment & Plan: Visit Diagnoses:  1. Left hand pain   2. Paronychia of finger, left     Plan: The location of patient's pain seems most consistent with a paronychia.  There is no evidence of erosions on x-ray concerning for osteomyelitis.  There is no visible purulence or drainage today.  No subungual purulence of lifting of the nail bed.  We will plan on continuing his oral antibiotics.  I will have him do BID soaks with warm, diluted Hibiclens solution for a week and see him back.  Follow-Up Instructions: No follow-ups on file.   Orders:  Orders Placed This Encounter  Procedures   XR Finger Little Left   No orders of the defined types were placed in this encounter.     Procedures: No procedures performed   Clinical Data: No additional findings.   Subjective: Chief Complaint  Patient presents with   Left Hand - Pain    This is a 61 yo M who presents with left small finger pain for a month or so.  It started with a GLF in which he landed on his finger.  He reports a soft tissue injury to the finger tip and nail fold. Approximately 2-3 weeks ago he noted purulent drainage from the lateral nail fold. His pain today is localized to the tip of the finger around the nail.  He denies any drainage for several weeks now.  He has a history of a CVA w/ residual L hand weakness and contractures.    Review of Systems   Objective: Vital Signs: There were no vitals taken for this visit.  Physical Exam Constitutional:      Appearance: Normal appearance.  Cardiovascular:     Rate and Rhythm: Normal rate.     Pulses: Normal pulses.  Pulmonary:     Effort: Pulmonary effort is normal.  Skin:    General: Skin is warm and  dry.     Capillary Refill: Capillary refill takes less than 2 seconds.  Neurological:     Mental Status: He is alert.    Left Hand Exam   Tenderness  Left hand tenderness location: TTP at tip of finger around nail folds.  No TTP proximal to the DIP joint.   Other  Erythema: absent Sensation: normal Pulse: present  Comments:  Mild swelling of finger tip.  Dystrophic appearing nail.  No drainage from the nail folds or under nail.  Nail remains adherent to nail bed.  Minimal AROM that he attributes to his stroke.       Specialty Comments:  No specialty comments available.  Imaging: No results found.   PMFS History: Patient Active Problem List   Diagnosis Date Noted   Paronychia of finger, left 02/03/2021   Left hand pain 12/29/2020   Pressure ulcer of ankle 09/01/2018   Thrombocytopenia ([place]) 01/17/2015   Transfusion history 01/17/2015   Hemodialysis AV fistula aneurysm ([place]) 11/10/2013   Cough 79/02/4095   CHF, systolic dysfunction 35/32/9924   Chronic obstructive pulmonary disease ([place]) 07/06/2012   Cerebral embolism with cerebral infarction ([place]) 08/13/2011  ESRD (end stage renal disease) on dialysis ([place]) 03/13/2011   HTN (hypertension) 03/13/2011   H/O: CVA (cardiovascular accident) 03/13/2011   Anemia 03/13/2011   Fluid overload 03/13/2011   History of nonadherence to medical treatment 03/13/2011   Tobacco abuse 03/13/2011   HEMATOCHEZIA 10/04/2008   Past Medical History:  Diagnosis Date   Anemia    Anxiety    Asthma    CHF (congestive heart failure) ([place])    Complication from renal dialysis device 66/29/4765   Complication of anesthesia 2017   according to pt and spouse pt was moving around and cough while under and pt had difficulty waking up so the anesthesia had to be reversed. and pt admitted to ICU.    COPD (chronic obstructive pulmonary disease) (HCC)    Diabetes mellitus without complication ([place])    Type II - Patient states he does not have  diabetes, "they said sometimes when you start dialysis you don't have diabetes any longer"    ESOPHAGEAL VARICES 10/04/2008   Qualifier: Diagnosis of  By: Fuller Plan MD Marijo Conception    ESRD    on HD, M- W- F - [place] [facility]   Hemiparesis due to old stroke [facility])    left   Hypertension    LV dysfunction    EF 25-30% by echo 07/2011   Memory loss due to medical condition    due to stroke   MR (mitral regurgitation)    moderate to severe, echo 07/2011   Peripheral vascular disease ([place])    Shortness of breath    Stroke ([place])    TIA's-left sided weakness   Tobacco abuse     Family History  Problem Relation Age of Onset   Hypertension Mother    Varicose Veins Mother    Diabetes Paternal Grandmother    Cancer Paternal Grandfather    CAD Paternal Uncle     Past Surgical History:  Procedure Laterality Date   ABDOMINAL AORTOGRAM W/LOWER EXTREMITY Bilateral 07/21/2018   Procedure: ABDOMINAL AORTOGRAM W/LOWER EXTREMITY;  Surgeon: Waynetta Sandy, MD;  Location: [place] [facility];  Service: Cardiovascular;  Laterality: Bilateral;   AMPUTATION Left 11/05/2018   Procedure: AMPUTATION SECOND TOE LEFT FOOT;  Surgeon: Waynetta Sandy, MD;  Location: [place];  Service: Vascular;  Laterality: Left;   AV FISTULA PLACEMENT     CARDIAC CATHETERIZATION     [facility]   COLONOSCOPY W/ BIOPSIES AND POLYPECTOMY     FISTULA SUPERFICIALIZATION Left 11/10/2013   Procedure: FISTULA PLICATION;  Surgeon: Serafina Mitchell, MD;  Location: [facility] OR;  Service: Vascular;  Laterality: Left;   KIDNEY TRANSPLANT     2011 rejected kidney 2012 back on dialysis   LEFT AND RIGHT HEART CATHETERIZATION WITH CORONARY ANGIOGRAM N/A 10/09/2013   Procedure: LEFT AND RIGHT HEART CATHETERIZATION WITH CORONARY ANGIOGRAM;  Surgeon: Burnell Blanks, MD;  Location: [facility] CATH LAB;  Service: Cardiovascular;  Laterality: N/A;   PERIPHERAL VASCULAR INTERVENTION Left 07/21/2018   Procedure: PERIPHERAL VASCULAR  INTERVENTION;  Surgeon: Waynetta Sandy, MD;  Location: [place] [facility];  Service: Cardiovascular;  Laterality: Left;  Popliteal   REVISON OF ARTERIOVENOUS FISTULA Left 08/26/2013   Procedure: EXCISION OF ERODED SKIN AND EXPLORATION OF MAIN LEFT UPPER ARM AV FISTULA;  Surgeon: Rosetta Posner, MD;  Location: [place];  Service: Vascular;  Laterality: Left;   REVISON OF ARTERIOVENOUS FISTULA Left 02/05/2014   Procedure: REPAIR OF ARTERIOVENOUS FISTULA ANEURYSM;  Surgeon: Serafina Mitchell, MD;  [facility];  Service: Vascular;  Laterality: Left;   SHUNTOGRAM N/A 11/05/2012   Procedure: Fistulogram;  Surgeon: Serafina Mitchell, MD;  Location: [facility] CATH LAB;  Service: Cardiovascular;  Laterality: N/A;   TEE WITHOUT CARDIOVERSION  08/17/2011   Procedure: TRANSESOPHAGEAL ECHOCARDIOGRAM (TEE);  Surgeon: Larey Dresser, MD;  Location: [facility] ENDOSCOPY;  Service: Cardiovascular;  Laterality: N/A;   [place] (VATS)/DECORTICATION  08/10/11   Social History   Occupational History   Occupation: Geographical information systems officer: DISABLED  Tobacco Use   Smoking status: Former    Packs/day: 0.50    Years: 15.00    Pack years: 7.50    Types: Cigarettes    Quit date: 2014    Years since quitting: 9.0   Smokeless tobacco: Never  Vaping Use   Vaping Use: Never used  Substance and Sexual Activity   Alcohol use: No    Alcohol/week: 0.0 standard drinks   Drug use: No   Sexual activity: Not Currently

## 2021-02-08 ENCOUNTER — Encounter: Payer: Self-pay | Admitting: Vascular Surgery

## 2021-02-08 ENCOUNTER — Ambulatory Visit (INDEPENDENT_AMBULATORY_CARE_PROVIDER_SITE_OTHER): Payer: Medicare Other | Admitting: Vascular Surgery

## 2021-02-08 ENCOUNTER — Other Ambulatory Visit: Payer: Self-pay

## 2021-02-08 VITALS — BP 152/86 | HR 64 | Temp 98.2°F | Resp 20 | Ht 69.0 in | Wt 173.0 lb

## 2021-02-08 DIAGNOSIS — N186 End stage renal disease: Secondary | ICD-10-CM

## 2021-02-08 NOTE — Progress Notes (Signed)
Patient ID: Nathaniel White, male   DOB: 11-19-1960, 61 y.o.   MRN: [id number]  Reason for Consult: Follow-up   Referred by Dorna Mai, MD  Subjective:     HPI:  Nathaniel White is a 60 y.o. male history of left arm AV fistula that is undergone multiple plications.  Fistula continues to work.  Most recently he had a scab on the fistula and the tape piece that has ripped it off and he has had some bleeding from the arm.  He has not had any significant blood loss.  He states that he does not bleed from the cannulation sites.  He does have significant disability the left upper arm has had a fistula on the right side that does not work very long he is hoping to stay with the fistula in the left upper extremity.  He does not take any blood thinners.  He is on dialysis Tuesdays, Thursdays and Saturdays.  Past Medical History:  Diagnosis Date   Anemia    Anxiety    Asthma    CHF (congestive heart failure) ([place])    Complication from renal dialysis device 50/53/9767   Complication of anesthesia 2017   according to pt and spouse pt was moving around and cough while under and pt had difficulty waking up so the anesthesia had to be reversed. and pt admitted to ICU.    COPD (chronic obstructive pulmonary disease) (HCC)    Diabetes mellitus without complication ([place])    Type II - Patient states he does not have diabetes, "they said sometimes when you start dialysis you don't have diabetes any longer"    ESOPHAGEAL VARICES 10/04/2008   Qualifier: Diagnosis of  By: Fuller Plan MD Marijo Conception    ESRD    on HD, M- W- F - [place] [facility]   Hemiparesis due to old stroke [facility])    left   Hypertension    LV dysfunction    EF 25-30% by echo 07/2011   Memory loss due to medical condition    due to stroke   MR (mitral regurgitation)    moderate to severe, echo 07/2011   Peripheral vascular disease ([place])    Shortness of breath    Stroke ([place])    TIA's-left sided weakness   Tobacco abuse    Family History   Problem Relation Age of Onset   Hypertension Mother    Varicose Veins Mother    Diabetes Paternal Grandmother    Cancer Paternal Grandfather    CAD Paternal Uncle    Past Surgical History:  Procedure Laterality Date   ABDOMINAL AORTOGRAM W/LOWER EXTREMITY Bilateral 07/21/2018   Procedure: ABDOMINAL AORTOGRAM W/LOWER EXTREMITY;  Surgeon: Waynetta Sandy, MD;  Location: [place] [facility];  Service: Cardiovascular;  Laterality: Bilateral;   AMPUTATION Left 11/05/2018   Procedure: AMPUTATION SECOND TOE LEFT FOOT;  Surgeon: Waynetta Sandy, MD;  Location: [place];  Service: Vascular;  Laterality: Left;   AV FISTULA PLACEMENT     CARDIAC CATHETERIZATION     [facility] [place] [facility]   COLONOSCOPY W/ BIOPSIES AND POLYPECTOMY     FISTULA SUPERFICIALIZATION Left 11/10/2013   Procedure: FISTULA PLICATION;  Surgeon: Serafina Mitchell, MD;  Location: [facility] OR;  Service: Vascular;  Laterality: Left;   KIDNEY TRANSPLANT     2011 rejected kidney 2012 back on dialysis   LEFT AND RIGHT HEART CATHETERIZATION WITH CORONARY ANGIOGRAM N/A 10/09/2013   Procedure: LEFT AND RIGHT HEART CATHETERIZATION WITH CORONARY ANGIOGRAM;  Surgeon: Burnell Blanks, MD;  Location: [facility] CATH LAB;  Service: Cardiovascular;  Laterality: N/A;   PERIPHERAL VASCULAR INTERVENTION Left 07/21/2018   Procedure: PERIPHERAL VASCULAR INTERVENTION;  Surgeon: Waynetta Sandy, MD;  Location: [place] [facility];  Service: Cardiovascular;  Laterality: Left;  Popliteal   REVISON OF ARTERIOVENOUS FISTULA Left 08/26/2013   Procedure: EXCISION OF ERODED SKIN AND EXPLORATION OF MAIN LEFT UPPER ARM AV FISTULA;  Surgeon: Rosetta Posner, MD;  Location: [place];  Service: Vascular;  Laterality: Left;   REVISON OF ARTERIOVENOUS FISTULA Left 02/05/2014   Procedure: REPAIR OF ARTERIOVENOUS FISTULA ANEURYSM;  Surgeon: Serafina Mitchell, MD;  Location: [place];  Service: Vascular;  Laterality: Left;   SHUNTOGRAM N/A 11/05/2012   Procedure:  Fistulogram;  Surgeon: Serafina Mitchell, MD;  Location: [facility] CATH LAB;  Service: Cardiovascular;  Laterality: N/A;   TEE WITHOUT CARDIOVERSION  08/17/2011   Procedure: TRANSESOPHAGEAL ECHOCARDIOGRAM (TEE);  Surgeon: Larey Dresser, MD;  Location: [facility] ENDOSCOPY;  Service: Cardiovascular;  Laterality: N/A;   VIDEO ASSISTED THORACOSCOPY (VATS)/DECORTICATION  08/10/11    Short Social History:  Social History   Tobacco Use   Smoking status: Former    Packs/day: 0.50    Years: 15.00    Pack years: 7.50    Types: Cigarettes    Quit date: 2014    Years since quitting: 9.0   Smokeless tobacco: Never  Substance Use Topics   Alcohol use: No    Alcohol/week: 0.0 standard drinks    Allergies  Allergen Reactions   Codeine Shortness Of Breath   Other Other (See Comments) and Shortness Of Breath   Dextromethorphan-Guaifenesin Other (See Comments)   Iron Dextran Other (See Comments)   Morphine And Related Other (See Comments)    Current Outpatient Medications  Medication Sig Dispense Refill   acetaminophen (TYLENOL) 500 MG tablet Take 1 tablet (500 mg total) by mouth every 6 (six) hours as needed. 30 tablet 0   albuterol (PROAIR HFA) 108 (90 Base) MCG/ACT inhaler Inhale 2 puffs into the lungs every 6 (six) hours as needed for wheezing or shortness of breath. 8.5 g 2   aspirin EC 81 MG tablet Take 81 mg by mouth 3 (three) times a week.     bacitracin 500 UNIT/GM ointment Apply 1 application topically 2 (two) times daily. 15 g 0   cephALEXin (KEFLEX) 500 MG capsule Take 1 capsule (500 mg total) by mouth 3 (three) times daily. 30 capsule 0   glucose blood test strip      fluticasone-salmeterol (ADVAIR DISKUS) 250-50 MCG/ACT AEPB Inhale 1 puff into the lungs in the morning and at bedtime. 180 each 3   montelukast (SINGULAIR) 10 MG tablet Take 1 tablet (10 mg total) by mouth at bedtime. 90 tablet 3   No current facility-administered medications for this visit.    Review of Systems  Constitutional:   Constitutional negative. HENT: HENT negative.  Eyes: Eyes negative.  Respiratory: Respiratory negative.  Cardiovascular: Cardiovascular negative.  GI: Gastrointestinal negative.  Musculoskeletal: Musculoskeletal negative.  Skin: Positive for wound.  Neurological: Neurological negative. Hematologic: Hematologic/lymphatic negative.  Psychiatric: Psychiatric negative.       Objective:  Objective   Vitals:   02/08/21 1441  BP: (!) 152/86  Pulse: 64  Resp: 20  Temp: 98.2 F (36.8 C)  SpO2: 93%  Weight: 173 lb (78.5 kg)  Height: 5\' 9"  (1.753 m)   Body mass index is 25.55 kg/m.  Physical Exam HENT:  Head: Normocephalic.     Nose:     Comments: Wearing a mask Cardiovascular:     Pulses:          Radial pulses are 1+ on the left side.  Pulmonary:     Effort: Pulmonary effort is normal.  Abdominal:     General: Abdomen is flat.  Musculoskeletal:     Comments: Left upper extremity AV fistula with palpable thrill there is a 1 cm area of thin and denuded skin with evidence of oozing.  Neurological:     Mental Status: He is alert.     Comments: Left upper extremity focal weakness    Data: No studies today     Assessment/Plan:     61 year old male on dialysis via left upper arm AV fistula on Tuesdays, Thursdays and Saturdays.  He has an area of very thin skin that has had oozing.  We will plan for revision of the fistula on a nondialysis day in the near future.     Waynetta Sandy MD [facility]

## 2021-02-10 ENCOUNTER — Encounter (HOSPITAL_COMMUNITY): Payer: Self-pay | Admitting: Orthopedic Surgery

## 2021-02-10 ENCOUNTER — Ambulatory Visit (INDEPENDENT_AMBULATORY_CARE_PROVIDER_SITE_OTHER): Payer: Medicare Other | Admitting: Orthopedic Surgery

## 2021-02-10 ENCOUNTER — Other Ambulatory Visit: Payer: Self-pay

## 2021-02-10 ENCOUNTER — Encounter: Payer: Self-pay | Admitting: Orthopedic Surgery

## 2021-02-10 DIAGNOSIS — L03012 Cellulitis of left finger: Secondary | ICD-10-CM | POA: Diagnosis not present

## 2021-02-10 NOTE — H&P (View-Only) (Signed)
Office Visit Note   Patient: Nathaniel White           Date of Birth: 1960/11/16           MRN: [id number] Visit Date: 02/10/2021              Requested by: Dorna Mai, [place] [address] [address] [place],  [place] [address] PCP: Dorna Mai, MD   Assessment & Plan: Visit Diagnoses:  1. Paronychia of finger, left     Plan: During exam, patient's left small finger nail is lifting off the nail bed.  Reexamination demonstrated purulence underneath the nail plate.  Digital block was performed and the nail plate was removed.  There is significant purulence in the distal aspect of the distal phalanx.  The nailbed had been destroyed.  Discussed with patient that he will need amputation of this left small finger for this chronic infection.  He does not have any tenderness proximal to the DIP joint, so can likely do a DIP disarticulation.  Discussed with patient that may need to go more proximal depending on the extent of the infection.  Risk of surgery were discussed including persistent infection, bleeding, damage to nearby structures, wound healing delays given his medical status, need for additional surgery.  We will plan on doing this soon as possible.  Follow-Up Instructions: No follow-ups on file.   Orders:  No orders of the defined types were placed in this encounter.  No orders of the defined types were placed in this encounter.     Procedures: No procedures performed   Clinical Data: No additional findings.   Subjective: Chief Complaint  Patient presents with   Left Little Finger - Follow-up    This is a 61 yo M who presents with continued left small finger pain for a month or so.  Started after a rather innocuous injury with subsequent paronychia injury.  He then developed drainage from the area about 2 weeks ago.  The drainage then stopped but he continues to have pain in the tip of the finger.   He was seen by another provider and has been on and off antibiotics.   His condition is largely unchanged.   Review of Systems   Objective: Vital Signs: There were no vitals taken for this visit.  Physical Exam Constitutional:      Appearance: Normal appearance.  Cardiovascular:     Rate and Rhythm: Normal rate.     Pulses: Normal pulses.  Pulmonary:     Effort: Pulmonary effort is normal.  Skin:    General: Skin is warm and dry.     Capillary Refill: Capillary refill takes less than 2 seconds.  Neurological:     Mental Status: He is alert.    Left Hand Exam   Tenderness  Left hand tenderness location: TTP at tip of finger distal to the DIP joint.   Other  Erythema: absent Sensation: normal Pulse: present  Comments:  Nail plate nonadherent.  Nail plate removed with frank purulence under.  Nail bed eroded away.  Tip of finger nonviable appearing.      Specialty Comments:  No specialty comments available.  Imaging: No results found.   PMFS History: Patient Active Problem List   Diagnosis Date Noted   Paronychia of finger, left 02/03/2021   Left hand pain 12/29/2020   Pressure ulcer of ankle 09/01/2018   Thrombocytopenia ([place]) 01/17/2015   Transfusion history 01/17/2015   Hemodialysis AV fistula aneurysm ([place]) 11/10/2013  Cough 20/35/5974   CHF, systolic dysfunction 16/38/4536   Chronic obstructive pulmonary disease ([place]) 07/06/2012   Cerebral embolism with cerebral infarction ([place]) 08/13/2011   ESRD (end stage renal disease) on dialysis ([place]) 03/13/2011   HTN (hypertension) 03/13/2011   H/O: CVA (cardiovascular accident) 03/13/2011   Anemia 03/13/2011   Fluid overload 03/13/2011   History of nonadherence to medical treatment 03/13/2011   Tobacco abuse 03/13/2011   HEMATOCHEZIA 10/04/2008   Past Medical History:  Diagnosis Date   Anemia    Anxiety    Asthma    CHF (congestive heart failure) ([place])    Complication from renal dialysis device 46/80/3212   Complication of anesthesia 2017   according to pt and spouse  pt was moving around and cough while under and pt had difficulty waking up so the anesthesia had to be reversed. and pt admitted to ICU.    COPD (chronic obstructive pulmonary disease) (HCC)    Diabetes mellitus without complication ([place])    Type II - Patient states he does not have diabetes, "they said sometimes when you start dialysis you don't have diabetes any longer"    ESOPHAGEAL VARICES 10/04/2008   Qualifier: Diagnosis of  By: Fuller Plan MD Marijo Conception    ESRD    on HD, M- W- F - [place] [facility]   Hemiparesis due to old stroke [facility])    left   Hypertension    LV dysfunction    EF 25-30% by echo 07/2011   Memory loss due to medical condition    due to stroke   MR (mitral regurgitation)    moderate to severe, echo 07/2011   Peripheral vascular disease ([place])    Shortness of breath    Stroke ([place])    TIA's-left sided weakness   Tobacco abuse     Family History  Problem Relation Age of Onset   Hypertension Mother    Varicose Veins Mother    Diabetes Paternal Grandmother    Cancer Paternal Grandfather    CAD Paternal Uncle     Past Surgical History:  Procedure Laterality Date   ABDOMINAL AORTOGRAM W/LOWER EXTREMITY Bilateral 07/21/2018   Procedure: ABDOMINAL AORTOGRAM W/LOWER EXTREMITY;  Surgeon: Waynetta Sandy, MD;  Location: [place] CV LAB;  Service: Cardiovascular;  Laterality: Bilateral;   AMPUTATION Left 11/05/2018   Procedure: AMPUTATION SECOND TOE LEFT FOOT;  Surgeon: Waynetta Sandy, MD;  Location: [place];  Service: Vascular;  Laterality: Left;   AV FISTULA PLACEMENT     CARDIAC CATHETERIZATION     [facility]   COLONOSCOPY W/ BIOPSIES AND POLYPECTOMY     FISTULA SUPERFICIALIZATION Left 11/10/2013   Procedure: FISTULA PLICATION;  Surgeon: Serafina Mitchell, MD;  Location: [facility] OR;  Service: Vascular;  Laterality: Left;   KIDNEY TRANSPLANT     2011 rejected kidney 2012 back on dialysis   LEFT AND RIGHT HEART CATHETERIZATION WITH CORONARY ANGIOGRAM  N/A 10/09/2013   Procedure: LEFT AND RIGHT HEART CATHETERIZATION WITH CORONARY ANGIOGRAM;  Surgeon: Burnell Blanks, MD;  Location: [facility] CATH LAB;  Service: Cardiovascular;  Laterality: N/A;   PERIPHERAL VASCULAR INTERVENTION Left 07/21/2018   Procedure: PERIPHERAL VASCULAR INTERVENTION;  Surgeon: Waynetta Sandy, MD;  Location: [place] CV LAB;  Service: Cardiovascular;  Laterality: Left;  Popliteal   REVISON OF ARTERIOVENOUS FISTULA Left 08/26/2013   Procedure: EXCISION OF ERODED SKIN AND EXPLORATION OF MAIN LEFT UPPER ARM AV FISTULA;  Surgeon: Rosetta Posner, MD;  Location: [place];  Service:  Vascular;  Laterality: Left;   REVISON OF ARTERIOVENOUS FISTULA Left 02/05/2014   Procedure: REPAIR OF ARTERIOVENOUS FISTULA ANEURYSM;  Surgeon: Serafina Mitchell, MD;  Location: [place];  Service: Vascular;  Laterality: Left;   SHUNTOGRAM N/A 11/05/2012   Procedure: Fistulogram;  Surgeon: Serafina Mitchell, MD;  Location: [facility] CATH LAB;  Service: Cardiovascular;  Laterality: N/A;   TEE WITHOUT CARDIOVERSION  08/17/2011   Procedure: TRANSESOPHAGEAL ECHOCARDIOGRAM (TEE);  Surgeon: Larey Dresser, MD;  Location: [facility] ENDOSCOPY;  Service: Cardiovascular;  Laterality: N/A;   [place] (VATS)/DECORTICATION  08/10/11   Social History   Occupational History   Occupation: Geographical information systems officer: DISABLED  Tobacco Use   Smoking status: Former    Packs/day: 0.50    Years: 15.00    Pack years: 7.50    Types: Cigarettes    Quit date: 2014    Years since quitting: 9.0   Smokeless tobacco: Never  Vaping Use   Vaping Use: Never used  Substance and Sexual Activity   Alcohol use: No    Alcohol/week: 0.0 standard drinks   Drug use: No   Sexual activity: Not Currently

## 2021-02-10 NOTE — Progress Notes (Signed)
DUE TO COVID-19 ONLY ONE VISITOR IS ALLOWED TO COME WITH YOU AND STAY IN THE WAITING ROOM ONLY DURING PRE OP AND PROCEDURE DAY OF SURGERY.   PCP - Geryl Rankins, NP Cardiologist - n/a  Chest x-ray - n/a EKG - DOS Stress Test - 2011 ECHO - 01/21/19 Cardiac Cath - 05/17/06 ([place])  ICD Pacemaker/Loop - n/a  Sleep Study -  n/a CPAP - none  Aspirin Instructions: Follow your surgeon's instructions on when to stop aspirin prior to surgery,  If no instructions were given by your surgeon then you will need to call the office for those instructions.  ERAS: Clear liquids til 12:30 PM DOS.  Anesthesia review: Yes, Dr Clance Boll now taking any Aspirin (unless otherwise instructed by your surgeon), Aleve, Naproxen, Ibuprofen, Motrin, Advil, Goody's, BC's, all herbal medications, fish oil, and all vitamins.   Coronavirus Screening Covid test n/a Ambulatory Surgery  Do you have any of the following symptoms:  Cough Yes, dry cough Fever (>100.69F)  yes/no: No Runny nose yes/no: No Sore throat yes/no: No Difficulty breathing/shortness of breath  Yes, not new  Have you traveled in the last 14 days and where? yes/no: No  Patient verbalized understanding of instructions that were given via phone.

## 2021-02-10 NOTE — Progress Notes (Signed)
Office Visit Note   Patient: Nathaniel White           Date of Birth: 12/11/1960           MRN: [id number] Visit Date: 02/10/2021              Requested by: Dorna Mai, [place],  [place] [address] PCP: Dorna Mai, MD   Assessment & Plan: Visit Diagnoses:  1. Paronychia of finger, left     Plan: During exam, patient's left small finger nail is lifting off the nail bed.  Reexamination demonstrated purulence underneath the nail plate.  Digital block was performed and the nail plate was removed.  There is significant purulence in the distal aspect of the distal phalanx.  The nailbed had been destroyed.  Discussed with patient that he will need amputation of this left small finger for this chronic infection.  He does not have any tenderness proximal to the DIP joint, so can likely do a DIP disarticulation.  Discussed with patient that may need to go more proximal depending on the extent of the infection.  Risk of surgery were discussed including persistent infection, bleeding, damage to nearby structures, wound healing delays given his medical status, need for additional surgery.  We will plan on doing this soon as possible.  Follow-Up Instructions: No follow-ups on file.   Orders:  No orders of the defined types were placed in this encounter.  No orders of the defined types were placed in this encounter.     Procedures: No procedures performed   Clinical Data: No additional findings.   Subjective: Chief Complaint  Patient presents with   Left Little Finger - Follow-up    This is a 61 yo M who presents with continued left small finger pain for a month or so.  Started after a rather innocuous injury with subsequent paronychia injury.  He then developed drainage from the area about 2 weeks ago.  The drainage then stopped but he continues to have pain in the tip of the finger.   He was seen by another provider and has been on and off antibiotics.   His condition is largely unchanged.   Review of Systems   Objective: Vital Signs: There were no vitals taken for this visit.  Physical Exam Constitutional:      Appearance: Normal appearance.  Cardiovascular:     Rate and Rhythm: Normal rate.     Pulses: Normal pulses.  Pulmonary:     Effort: Pulmonary effort is normal.  Skin:    General: Skin is warm and dry.     Capillary Refill: Capillary refill takes less than 2 seconds.  Neurological:     Mental Status: He is alert.    Left Hand Exam   Tenderness  Left hand tenderness location: TTP at tip of finger distal to the DIP joint.   Other  Erythema: absent Sensation: normal Pulse: present  Comments:  Nail plate nonadherent.  Nail plate removed with frank purulence under.  Nail bed eroded away.  Tip of finger nonviable appearing.      Specialty Comments:  No specialty comments available.  Imaging: No results found.   PMFS History: Patient Active Problem List   Diagnosis Date Noted   Paronychia of finger, left 02/03/2021   Left hand pain 12/29/2020   Pressure ulcer of ankle 09/01/2018   Thrombocytopenia ([facility]) 01/17/2015   Transfusion history 01/17/2015   Hemodialysis AV fistula aneurysm ([place]) 11/10/2013  Cough 78/67/6720   CHF, systolic dysfunction 94/70/9628   Chronic obstructive pulmonary disease ([place]) 07/06/2012   Cerebral embolism with cerebral infarction ([facility]) 08/13/2011   ESRD (end stage renal disease) on dialysis ([place]) 03/13/2011   HTN (hypertension) 03/13/2011   H/O: CVA (cardiovascular accident) 03/13/2011   Anemia 03/13/2011   Fluid overload 03/13/2011   History of nonadherence to medical treatment 03/13/2011   Tobacco abuse 03/13/2011   HEMATOCHEZIA 10/04/2008   Past Medical History:  Diagnosis Date   Anemia    Anxiety    Asthma    CHF (congestive heart failure) ([place])    Complication from renal dialysis device 36/62/9476   Complication of anesthesia 2017   according to pt and spouse  pt was moving around and cough while under and pt had difficulty waking up so the anesthesia had to be reversed. and pt admitted to ICU.    COPD (chronic obstructive pulmonary disease) ([facility])    Diabetes mellitus without complication ([place])    Type II - Patient states he does not have diabetes, "they said sometimes when you start dialysis you don't have diabetes any longer"    ESOPHAGEAL VARICES 10/04/2008   Qualifier: Diagnosis of  By: Fuller Plan MD Marijo Conception    ESRD    on HD, M- W- F - [place]   Hemiparesis due to old stroke [facility])    left   Hypertension    LV dysfunction    EF 25-30% by echo 07/2011   Memory loss due to medical condition    due to stroke   MR (mitral regurgitation)    moderate to severe, echo 07/2011   Peripheral vascular disease ([place])    Shortness of breath    Stroke ([facility])    TIA's-left sided weakness   Tobacco abuse     Family History  Problem Relation Age of Onset   Hypertension Mother    Varicose Veins Mother    Diabetes Paternal Grandmother    Cancer Paternal Grandfather    CAD Paternal Uncle     Past Surgical History:  Procedure Laterality Date   ABDOMINAL AORTOGRAM W/LOWER EXTREMITY Bilateral 07/21/2018   Procedure: ABDOMINAL AORTOGRAM W/LOWER EXTREMITY;  Surgeon: Waynetta Sandy, MD;  Location: [facility];  Service: Cardiovascular;  Laterality: Bilateral;   AMPUTATION Left 11/05/2018   Procedure: AMPUTATION SECOND TOE LEFT FOOT;  Surgeon: Waynetta Sandy, MD;  Location: [facility];  Service: Vascular;  Laterality: Left;   AV FISTULA PLACEMENT     CARDIAC CATHETERIZATION     [facility]   COLONOSCOPY W/ BIOPSIES AND POLYPECTOMY     FISTULA SUPERFICIALIZATION Left 11/10/2013   Procedure: FISTULA PLICATION;  Surgeon: Serafina Mitchell, MD;  Location: [facility];  Service: Vascular;  Laterality: Left;   KIDNEY TRANSPLANT     2011 rejected kidney 2012 back on dialysis   LEFT AND RIGHT HEART CATHETERIZATION WITH CORONARY ANGIOGRAM  N/A 10/09/2013   Procedure: LEFT AND RIGHT HEART CATHETERIZATION WITH CORONARY ANGIOGRAM;  Surgeon: Burnell Blanks, MD;  Location: [facility] CATH LAB;  Service: Cardiovascular;  Laterality: N/A;   PERIPHERAL VASCULAR INTERVENTION Left 07/21/2018   Procedure: PERIPHERAL VASCULAR INTERVENTION;  Surgeon: Waynetta Sandy, MD;  Location: [facility];  Service: Cardiovascular;  Laterality: Left;  Popliteal   REVISON OF ARTERIOVENOUS FISTULA Left 08/26/2013   Procedure: EXCISION OF ERODED SKIN AND EXPLORATION OF MAIN LEFT UPPER ARM AV FISTULA;  Surgeon: Rosetta Posner, MD;  Location: [facility];  Service:  Vascular;  Laterality: Left;   REVISON OF ARTERIOVENOUS FISTULA Left 02/05/2014   Procedure: REPAIR OF ARTERIOVENOUS FISTULA ANEURYSM;  Surgeon: Serafina Mitchell, MD;  Location: [facility];  Service: Vascular;  Laterality: Left;   SHUNTOGRAM N/A 11/05/2012   Procedure: Fistulogram;  Surgeon: Serafina Mitchell, MD;  Location: [facility] CATH LAB;  Service: Cardiovascular;  Laterality: N/A;   TEE WITHOUT CARDIOVERSION  08/17/2011   Procedure: TRANSESOPHAGEAL ECHOCARDIOGRAM (TEE);  Surgeon: Larey Dresser, MD;  Location: [facility] ENDOSCOPY;  Service: Cardiovascular;  Laterality: N/A;   [place] (VATS)/DECORTICATION  08/10/11   Social History   Occupational History   Occupation: Geographical information systems officer: DISABLED  Tobacco Use   Smoking status: Former    Packs/day: 0.50    Years: 15.00    Pack years: 7.50    Types: Cigarettes    Quit date: 2014    Years since quitting: 9.0   Smokeless tobacco: Never  Vaping Use   Vaping Use: Never used  Substance and Sexual Activity   Alcohol use: No    Alcohol/week: 0.0 standard drinks   Drug use: No   Sexual activity: Not Currently

## 2021-02-13 ENCOUNTER — Ambulatory Visit (HOSPITAL_COMMUNITY): Payer: Medicare Other | Admitting: Anesthesiology

## 2021-02-13 ENCOUNTER — Encounter (HOSPITAL_COMMUNITY)
Admission: RE | Disposition: A | Discharge status: Home / Self Care | Payer: Self-pay | Source: Home / Self Care | Attending: Orthopedic Surgery

## 2021-02-13 ENCOUNTER — Other Ambulatory Visit: Payer: Self-pay

## 2021-02-13 ENCOUNTER — Ambulatory Visit (HOSPITAL_COMMUNITY)
Admission: RE | Admit: 2021-02-13 | Discharge: 2021-02-13 | Disposition: A | Discharge status: Home / Self Care | Payer: Medicare Other | Attending: Orthopedic Surgery | Admitting: Orthopedic Surgery

## 2021-02-13 ENCOUNTER — Encounter (HOSPITAL_COMMUNITY): Payer: Self-pay | Admitting: Orthopedic Surgery

## 2021-02-13 DIAGNOSIS — E1122 Type 2 diabetes mellitus with diabetic chronic kidney disease: Secondary | ICD-10-CM | POA: Insufficient documentation

## 2021-02-13 DIAGNOSIS — Z992 Dependence on renal dialysis: Secondary | ICD-10-CM | POA: Insufficient documentation

## 2021-02-13 DIAGNOSIS — L03012 Cellulitis of left finger: Secondary | ICD-10-CM | POA: Insufficient documentation

## 2021-02-13 DIAGNOSIS — E1151 Type 2 diabetes mellitus with diabetic peripheral angiopathy without gangrene: Secondary | ICD-10-CM | POA: Diagnosis not present

## 2021-02-13 DIAGNOSIS — Z87891 Personal history of nicotine dependence: Secondary | ICD-10-CM | POA: Insufficient documentation

## 2021-02-13 DIAGNOSIS — Z8673 Personal history of transient ischemic attack (TIA), and cerebral infarction without residual deficits: Secondary | ICD-10-CM | POA: Insufficient documentation

## 2021-02-13 DIAGNOSIS — I509 Heart failure, unspecified: Secondary | ICD-10-CM | POA: Insufficient documentation

## 2021-02-13 DIAGNOSIS — J449 Chronic obstructive pulmonary disease, unspecified: Secondary | ICD-10-CM | POA: Diagnosis not present

## 2021-02-13 DIAGNOSIS — Z20822 Contact with and (suspected) exposure to covid-19: Secondary | ICD-10-CM | POA: Diagnosis not present

## 2021-02-13 DIAGNOSIS — N186 End stage renal disease: Secondary | ICD-10-CM | POA: Diagnosis not present

## 2021-02-13 DIAGNOSIS — L089 Local infection of the skin and subcutaneous tissue, unspecified: Secondary | ICD-10-CM

## 2021-02-13 DIAGNOSIS — I132 Hypertensive heart and chronic kidney disease with heart failure and with stage 5 chronic kidney disease, or end stage renal disease: Secondary | ICD-10-CM | POA: Insufficient documentation

## 2021-02-13 HISTORY — PX: AMPUTATION: SHX166

## 2021-02-13 LAB — GLUCOSE, CAPILLARY
Glucose-Capillary: 78 mg/dL (ref 70–99)
Glucose-Capillary: 68 mg/dL — ABNORMAL LOW (ref 70–99)
Glucose-Capillary: 65 mg/dL — ABNORMAL LOW (ref 70–99)

## 2021-02-13 LAB — POCT I-STAT, CHEM 8
BUN: 67 mg/dL — ABNORMAL HIGH (ref 6–20)
Calcium, Ion: 0.8 mmol/L — CL (ref 1.15–1.40)
Chloride: 97 mmol/L — ABNORMAL LOW (ref 98–111)
Creatinine, Ser: 11.9 mg/dL — ABNORMAL HIGH (ref 0.61–1.24)
Glucose, Bld: 76 mg/dL (ref 70–99)
HCT: 48 % (ref 39.0–52.0)
Hemoglobin: 16.3 g/dL (ref 13.0–17.0)
Potassium: 5.6 mmol/L — ABNORMAL HIGH (ref 3.5–5.1)
Sodium: 138 mmol/L (ref 135–145)
TCO2: 32 mmol/L (ref 22–32)

## 2021-02-13 LAB — SARS CORONAVIRUS 2 BY RT PCR (HOSPITAL ORDER, PERFORMED IN ~~LOC~~ HOSPITAL LAB): SARS Coronavirus 2: NEGATIVE

## 2021-02-13 SURGERY — AMPUTATION DIGIT
Anesthesia: Monitor Anesthesia Care | Site: Finger | Laterality: Left

## 2021-02-13 MED ORDER — CEFAZOLIN SODIUM-DEXTROSE 2-4 GM/100ML-% IV SOLN
2.0000 g | INTRAVENOUS | Status: AC
  Administered 2021-02-13: 2 g via INTRAVENOUS
  Filled 2021-02-13: qty 100

## 2021-02-13 MED ORDER — PROPOFOL 10 MG/ML IV BOLUS
INTRAVENOUS | Status: DC | PRN
  Administered 2021-02-13 (×2): 20 mg via INTRAVENOUS

## 2021-02-13 MED ORDER — BACITRACIN ZINC 500 UNIT/GM EX OINT
TOPICAL_OINTMENT | CUTANEOUS | Status: AC
  Filled 2021-02-13: qty 28.35

## 2021-02-13 MED ORDER — ONDANSETRON HCL 4 MG/2ML IJ SOLN
INTRAMUSCULAR | Status: DC | PRN
  Administered 2021-02-13: 4 mg via INTRAVENOUS

## 2021-02-13 MED ORDER — DEXAMETHASONE SODIUM PHOSPHATE 10 MG/ML IJ SOLN
INTRAMUSCULAR | Status: DC | PRN
Start: 2021-02-13 — End: 2021-02-13
  Administered 2021-02-13: 10 mg via INTRAVENOUS

## 2021-02-13 MED ORDER — SODIUM CHLORIDE 0.9 % IV SOLN
INTRAVENOUS | Status: DC | PRN
Start: 2021-02-13 — End: 2021-02-13

## 2021-02-13 MED ORDER — BUPIVACAINE HCL (PF) 0.25 % IJ SOLN
INTRAMUSCULAR | Status: AC
  Filled 2021-02-13: qty 30

## 2021-02-13 MED ORDER — PROPOFOL 500 MG/50ML IV EMUL
INTRAVENOUS | Status: DC | PRN
Start: 2021-02-13 — End: 2021-02-13
  Administered 2021-02-13: 50 ug/kg/min via INTRAVENOUS

## 2021-02-13 MED ORDER — CHLORHEXIDINE GLUCONATE 0.12 % MT SOLN
OROMUCOSAL | Status: AC
  Filled 2021-02-13: qty 15

## 2021-02-13 MED ORDER — PHENYLEPHRINE 40 MCG/ML (10ML) SYRINGE FOR IV PUSH (FOR BLOOD PRESSURE SUPPORT)
PREFILLED_SYRINGE | INTRAVENOUS | Status: DC | PRN
Start: 2021-02-13 — End: 2021-02-13
  Administered 2021-02-13: 120 ug via INTRAVENOUS
  Administered 2021-02-13: 80 ug via INTRAVENOUS
  Administered 2021-02-13: 160 ug via INTRAVENOUS
  Administered 2021-02-13: 80 ug via INTRAVENOUS

## 2021-02-13 MED ORDER — OXYCODONE HCL 5 MG PO TABS
5.0000 mg | ORAL_TABLET | ORAL | 0 refills | Status: DC | PRN
Start: 2021-02-13 — End: 2021-03-15

## 2021-02-13 MED ORDER — 0.9 % SODIUM CHLORIDE (POUR BTL) OPTIME
TOPICAL | Status: DC | PRN
  Administered 2021-02-13: 1000 mL

## 2021-02-13 MED ORDER — BACITRACIN ZINC 500 UNIT/GM EX OINT
TOPICAL_OINTMENT | CUTANEOUS | Status: DC | PRN
Start: 2021-02-13 — End: 2021-02-13
  Administered 2021-02-13: 1 via TOPICAL

## 2021-02-13 MED ORDER — EPHEDRINE SULFATE-NACL 50-0.9 MG/10ML-% IV SOSY
PREFILLED_SYRINGE | INTRAVENOUS | Status: DC | PRN
Start: 2021-02-13 — End: 2021-02-13
  Administered 2021-02-13: 20 mg via INTRAVENOUS
  Administered 2021-02-13: 5 mg via INTRAVENOUS

## 2021-02-13 MED ORDER — BUPIVACAINE HCL (PF) 0.25 % IJ SOLN
INTRAMUSCULAR | Status: DC | PRN
  Administered 2021-02-13: 30 mL

## 2021-02-13 MED ORDER — FENTANYL CITRATE (PF) 250 MCG/5ML IJ SOLN
INTRAMUSCULAR | Status: AC
  Filled 2021-02-13: qty 5

## 2021-02-13 SURGICAL SUPPLY — 44 items
BAG COUNTER SPONGE SURGICOUNT (BAG) ×2 IMPLANT
BNDG COHESIVE 1X5 TAN STRL LF (GAUZE/BANDAGES/DRESSINGS) ×2 IMPLANT
BNDG CONFORM 2 STRL LF (GAUZE/BANDAGES/DRESSINGS) IMPLANT
BNDG ELASTIC 2X5.8 VLCR STR LF (GAUZE/BANDAGES/DRESSINGS) ×2 IMPLANT
BNDG ELASTIC 3X5.8 VLCR STR LF (GAUZE/BANDAGES/DRESSINGS) IMPLANT
BNDG ELASTIC 4X5.8 VLCR STR LF (GAUZE/BANDAGES/DRESSINGS) IMPLANT
BNDG GAUZE ELAST 4 BULKY (GAUZE/BANDAGES/DRESSINGS) ×1 IMPLANT
CORD BIPOLAR FORCEPS 12FT (ELECTRODE) ×2 IMPLANT
COVER SURGICAL LIGHT HANDLE (MISCELLANEOUS) ×2 IMPLANT
CUFF TOURN SGL QUICK 18X4 (TOURNIQUET CUFF) ×1 IMPLANT
CUFF TOURN SGL QUICK 24 (TOURNIQUET CUFF)
CUFF TRNQT CYL 24X4X16.5-23 (TOURNIQUET CUFF) IMPLANT
DRAPE SURG 17X23 STRL (DRAPES) ×2 IMPLANT
DRSG ADAPTIC 3X8 NADH LF (GAUZE/BANDAGES/DRESSINGS) ×1 IMPLANT
GAUZE SPONGE 2X2 8PLY STRL LF (GAUZE/BANDAGES/DRESSINGS) IMPLANT
GAUZE SPONGE 4X4 12PLY STRL (GAUZE/BANDAGES/DRESSINGS) ×1 IMPLANT
GAUZE XEROFORM 1X8 LF (GAUZE/BANDAGES/DRESSINGS) ×1 IMPLANT
GLOVE SURG ORTHO LTX SZ8 (GLOVE) ×2 IMPLANT
GLOVE SURG UNDER POLY LF SZ8.5 (GLOVE) ×2 IMPLANT
GOWN STRL REUS W/ TWL LRG LVL3 (GOWN DISPOSABLE) ×2 IMPLANT
GOWN STRL REUS W/ TWL XL LVL3 (GOWN DISPOSABLE) ×1 IMPLANT
GOWN STRL REUS W/TWL LRG LVL3 (GOWN DISPOSABLE) ×2
GOWN STRL REUS W/TWL XL LVL3 (GOWN DISPOSABLE) ×2
KIT BASIN OR (CUSTOM PROCEDURE TRAY) ×2 IMPLANT
KIT TURNOVER KIT B (KITS) ×2 IMPLANT
MANIFOLD NEPTUNE II (INSTRUMENTS) ×1 IMPLANT
NDL HYPO 25GX1X1/2 BEV (NEEDLE) IMPLANT
NEEDLE HYPO 25GX1X1/2 BEV (NEEDLE) IMPLANT
NS IRRIG 1000ML POUR BTL (IV SOLUTION) ×2 IMPLANT
PACK ORTHO EXTREMITY (CUSTOM PROCEDURE TRAY) ×2 IMPLANT
PAD ARMBOARD 7.5X6 YLW CONV (MISCELLANEOUS) ×4 IMPLANT
PAD CAST 4YDX4 CTTN HI CHSV (CAST SUPPLIES) IMPLANT
PADDING CAST COTTON 4X4 STRL (CAST SUPPLIES)
SOAP 2 % CHG 4 OZ (WOUND CARE) ×1 IMPLANT
SPECIMEN JAR SMALL (MISCELLANEOUS) ×2 IMPLANT
SPONGE GAUZE 2X2 STER 10/PKG (GAUZE/BANDAGES/DRESSINGS)
SUT MERSILENE 4 0 P 3 (SUTURE) IMPLANT
SUT PROLENE 4 0 PS 2 18 (SUTURE) IMPLANT
SUT VICRYL RAPIDE 4/0 PS 2 (SUTURE) ×1 IMPLANT
SYR CONTROL 10ML LL (SYRINGE) ×1 IMPLANT
TOWEL GREEN STERILE (TOWEL DISPOSABLE) ×2 IMPLANT
TOWEL GREEN STERILE FF (TOWEL DISPOSABLE) ×2 IMPLANT
TUBE CONNECTING 12X1/4 (SUCTIONS) IMPLANT
WATER STERILE IRR 1000ML POUR (IV SOLUTION) ×1 IMPLANT

## 2021-02-13 NOTE — Brief Op Note (Signed)
02/13/2021  4:49 PM  PATIENT:  Nathaniel White  61 y.o. male  PRE-OPERATIVE DIAGNOSIS:  Left small finger infection  POST-OPERATIVE DIAGNOSIS:  Left small finger infection  PROCEDURE:  Procedure(s): Left small finger AMPUTATION DIGIT (Left)  SURGEON:  Surgeon(s) and Role:    * Sherilyn Cooter, MD - Primary  PHYSICIAN ASSISTANT:   ASSISTANTS: none   ANESTHESIA:   local and MAC  EBL:  5 cc   BLOOD ADMINISTERED:none  DRAINS: none   LOCAL MEDICATIONS USED:  MARCAINE     SPECIMEN:  Source of Specimen:  small finger swab plus bone tissue  DISPOSITION OF SPECIMEN:   Micro  COUNTS:  YES  TOURNIQUET:  * Missing tourniquet times found for documented tourniquets in log: [id number] *  DICTATION: .Dragon Dictation  PLAN OF CARE: Discharge to home after PACU  PATIENT DISPOSITION:  PACU - hemodynamically stable.   Delay start of Pharmacological VTE agent (>24hrs) due to surgical blood loss or risk of bleeding: not applicable

## 2021-02-13 NOTE — Interval H&P Note (Signed)
History and Physical Interval Note:  02/13/2021 1:58 PM  Nathaniel White  has presented today for surgery, with the diagnosis of Left small finger infection.  The various methods of treatment have been discussed with the patient and family. After consideration of risks, benefits and other options for treatment, the patient has consented to  Procedure(s): Left small finger AMPUTATION DIGIT (Left) as a surgical intervention.  The patient's history has been reviewed, patient examined, no change in status, stable for surgery.  I have reviewed the patient's chart and labs.  Questions were answered to the patient's satisfaction.      Libero Puthoff

## 2021-02-13 NOTE — Anesthesia Preprocedure Evaluation (Addendum)
Anesthesia Evaluation  Patient identified by MRN, date of birth, ID band Patient awake    Reviewed: Allergy & Precautions, NPO status , Patient's Chart, lab work & pertinent test results  Airway Mallampati: II  TM Distance: >3 FB Neck ROM: Full    Dental no notable dental hx.    Pulmonary asthma , COPD, Recent URI  (New, productive cough), former smoker,    Pulmonary exam normal breath sounds clear to auscultation       Cardiovascular hypertension, + Peripheral Vascular Disease and +CHF  Normal cardiovascular exam+ Valvular Problems/Murmurs MR  Rhythm:Regular Rate:Normal  Undetermined rhythm Indeterminate axis Incomplete right bundle branch block Minimal voltage criteria for LVH, may be normal variant ( Cornell product ) Septal infarct , age undetermined Lateral infarct , age undetermined Prolonged QT Abnormal ECG When compared with ECG of 03-Mar-2018 22:18, PREVIOUS ECG IS PRESENT   ECHO 2021: 1. The left ventricle demonstrates global hypokinesis.  2. Left ventricular ejection fraction, by visual estimation, is 40-45%.  The left ventricle has normal function. Left ventricular septal wall  thickness was mildly increased. Mildly increased left ventricular  posterior wall thickness.  3. Left ventricular diastolic parameters are consistent with Grade II  diastolic dysfunction (pseudonormalization).  4. Elevated left ventricular end-diastolic pressure.  5. Global right ventricle has moderately reduced systolic function.The  right ventricular size is normal. No increase in right ventricular wall  thickness.  6. Left atrial size was severely dilated.  7. Right atrial size was severely dilated.  8. Mild calcification of the anterior mitral valve leaflet(s). Moderate  thickening of the anterior mitral valve leaflet(s). Mild mitral valve  regurgitation. No evidence of mitral stenosis.  9. The tricuspid valve is normal  in structure.  10. The aortic valve is tricuspid. Aortic valve regurgitation is not  visualized. Mild to moderate aortic valve sclerosis/calcification without  any evidence of aortic stenosis.  11. The pulmonic valve was normal in structure. Pulmonic valve  regurgitation is not visualized.  12. Moderately elevated pulmonary artery systolic pressure.  13. The inferior vena cava is normal in size with greater than 50%  respiratory variability, suggesting right atrial pressure of 3 mmHg.    Neuro/Psych PSYCHIATRIC DISORDERS Anxiety CVA (left sided weakness), Residual Symptoms    GI/Hepatic negative GI ROS, Neg liver ROS,   Endo/Other  negative endocrine ROSdiabetes  Renal/GU Dialysis and ESRFRenal disease (T/Th/Sat)  negative genitourinary   Musculoskeletal negative musculoskeletal ROS (+)   Abdominal   Peds negative pediatric ROS (+)  Hematology  (+) Blood dyscrasia, anemia ,   Anesthesia Other Findings   Reproductive/Obstetrics negative OB ROS                            Anesthesia Physical Anesthesia Plan  ASA: 4  Anesthesia Plan: MAC   Post-op Pain Management: Minimal or no pain anticipated   Induction: Intravenous  PONV Risk Score and Plan: Ondansetron, Propofol infusion and Treatment may vary due to age or medical condition  Airway Management Planned: Natural Airway and Simple Face Mask  Additional Equipment:   Intra-op Plan:   Post-operative Plan: Extubation in OR  Informed Consent: I have reviewed the patients History and Physical, chart, labs and discussed the procedure including the risks, benefits and alternatives for the proposed anesthesia with the patient or authorized representative who has indicated his/her understanding and acceptance.     Dental advisory given  Plan Discussed with: CRNA, Anesthesiologist and Surgeon  Anesthesia  Plan Comments: (Local by surgeon. Propofol gtt. Norton Blizzard, MD  )      Anesthesia  Quick Evaluation

## 2021-02-13 NOTE — Discharge Instructions (Signed)
Audria Nine, M.D. Hand Surgery  POST-OPERATIVE DISCHARGE INSTRUCTIONS   PRESCRIPTIONS: You have been given a prescription to be taken as directed for post-operative pain control.  You may also take over the counter ibuprofen/aleve and tylenol for pain. Take this as directed on the packaging. Do not exceed 3000 mg tylenol/acetaminophen in 24 hours.  Ibuprofen 600-800 mg (3-4) tablets by mouth every 6 hours as needed for pain.  OR Aleve 2 tablets by mouth every 12 hours (twice daily) as needed for pain.  AND/OR Tylenol 1000 mg (2 tablets) every 8 hours as needed for pain.  Please use your pain medication carefully, as refills are limited and you may not be provided with one.  As stated above, please use over the counter pain medicine - it will also be helpful with decreasing your swelling.    ANESTHESIA: After your surgery, post-surgical discomfort or pain is likely. This discomfort can last several days to a few weeks. At certain times of the day your discomfort may be more intense.   Did you receive a nerve block?  A nerve block can provide pain relief for one hour to two days after your surgery. As long as the nerve block is working, you will experience little or no sensation in the area the surgeon operated on.  As the nerve block wears off, you will begin to experience pain or discomfort. It is very important that you begin taking your prescribed pain medication before the nerve block fully wears off. Treating your pain at the first sign of the block wearing off will ensure your pain is better controlled and more tolerable when full-sensation returns. Do not wait until the pain is intolerable, as the medicine will be less effective. It is better to treat pain in advance than to try and catch up.   General Anesthesia:  If you did not receive a nerve block during your surgery, you will need to start taking your pain medication shortly after your surgery and should continue to do  so as prescribed by your surgeon.     ELEVATION: Motion of your fingers is very important s to decrease the swelling.  Elevation, as much as possible for the next 48 hours, is critical for decreasing swelling as well as for pain relief. Elevation means when you are seated or lying down, you hand should be at or above your heart. When walking, the hand needs to be at or above the level of your elbow.  If the bandage gets too tight, it may need to be loosened. Please contact our office and we will instruct you in how to do this.    SURGICAL BANDAGES:  Keep your dressing and/or splint clean and dry at all times.  Do not remove until you are seen again in the office.  If careful, you may place a plastic bag over your bandage and tape the end to shower, but be careful, do not get your bandages wet.     HAND THERAPY:  You may not need any. If you do, we will begin this at your follow up visit in the clinic.    ACTIVITY AND WORK: You are encouraged to move any fingers which are not in the bandage.  Light use of the fingers is allowed to assist the other hand with daily hygiene and eating, but strong gripping or lifting is often uncomfortable and should be avoided.  You might miss a variable period of time from work and hopefully this issue  has been discussed prior to surgery. You may not do any heavy work with your affected hand for about 2 weeks.    [facility] [address] [place],  [place]  [address] [phone]

## 2021-02-13 NOTE — Op Note (Signed)
° °  Date of Surgery: 02/13/2021  INDICATIONS: Mr. Kurek is a 61 y.o.-year-old male with left small finger infection involving the distal phalanx.  His distal phalanx was easily removed in the office with extensive underlying purulence and erosion of the nail bed tissue.  He was tender to the level of the PIP joint with no tenderness proximal.  Risks, benefits, and alternatives to surgery were again discussed with the patient wishing to proceed with surgery.  Informed consent was signed after our discussion.   PREOPERATIVE DIAGNOSIS:  Left small finger soft tissue infection  POSTOPERATIVE DIAGNOSIS: Same.  PROCEDURE:  Left small finger amputation   SURGEON: Audria Nine, M.D.  ASSIST:   ANESTHESIA:  Local, MAC  IV FLUIDS AND URINE: See anesthesia.  ESTIMATED BLOOD LOSS: 5 mL.  IMPLANTS: * No implants in log *   DRAINS: None  COMPLICATIONS: see description of procedure.  DESCRIPTION OF PROCEDURE: The patient was met in the preoperative holding area where the surgical site was marked and the consent form was verified.  The patient was then taken to the operating room and transferred to the operating table.  All bony prominences were well padded.  The operative extremity was prepped and draped in the usual and sterile fashion.  A formal time-out was performed to confirm that this was the correct patient, surgery, side, and site.  Following timeout, a local block was performed using approximately 10 cc of quarter percent Marcaine plain.  A finger turnicot was applied to the base of the small finger.  A fishmouth type incision was planned at the level of the PIP joint.  The skin and subcutaneous tissue was divided.  The extensor mechanism was divided.  The flexor tendons were identified, traction applied, and they were transected.  Disarticulation at the PIP joint was performed.  The head of the proximal phalanx had to be rongeured to allow for tension-free closure of the skin flap.  The  radial and ulnar neurovascular bundles were identified.  The digital nerves were transected under tension for traction neurectomy.  Culture swabs were taken prior to complete amputation.  Some of the bone from the proximal phalanx was sent for tissue culture.  The wound was thoroughly irrigated.  Was closed using 4-0 Vicryl Rapide sutures.  The turnicot was removed and hemostasis achieved with direct pressure.  Xeroform, bacitracin ointment, 4 x 4's, and an Ace wrap were applied.  The patient was then reversed anesthesia and transferred to the postoperative bed.  All counts were correct and procedure.  The patient continue to PACU in stable condition.   POSTOPERATIVE PLAN: He will be discharged with appropriate pain medication and discharge instructions.  I will see him back in 1 week for wound check.  Audria Nine, MD 4:59 PM

## 2021-02-13 NOTE — Transfer of Care (Signed)
Immediate Anesthesia Transfer of Care Note  Patient: Nathaniel White  Procedure(s) Performed: Left small finger AMPUTATION DIGIT (Left: Finger)  Patient Location: PACU  Anesthesia Type:MAC  Level of Consciousness: drowsy and patient cooperative  Airway & Oxygen Therapy: Patient Spontanous Breathing and Patient connected to face mask oxygen  Post-op Assessment: Report given to RN and Post -op Vital signs reviewed and stable  Post vital signs: Reviewed and stable  Last Vitals:  Vitals Value Taken Time  BP 140/79 02/13/21 1655  Temp    Pulse 94 02/13/21 1658  Resp 29 02/13/21 1658  SpO2 93 % 02/13/21 1658  Vitals shown include unvalidated device data.  Last Pain:  Vitals:   02/13/21 1349  TempSrc: Oral         Complications: No notable events documented.

## 2021-02-14 ENCOUNTER — Encounter (HOSPITAL_COMMUNITY): Payer: Self-pay | Admitting: Orthopedic Surgery

## 2021-02-14 ENCOUNTER — Other Ambulatory Visit: Payer: Self-pay

## 2021-02-14 NOTE — Anesthesia Postprocedure Evaluation (Signed)
Anesthesia Post Note  Patient: Nathaniel White  Procedure(s) Performed: Left small finger AMPUTATION DIGIT (Left: Finger)     Patient location during evaluation: PACU Anesthesia Type: MAC Level of consciousness: awake and alert Pain management: pain level controlled Vital Signs Assessment: post-procedure vital signs reviewed and stable Respiratory status: spontaneous breathing Cardiovascular status: stable Anesthetic complications: no   No notable events documented.  Last Vitals:  Vitals:   02/13/21 1735 02/13/21 1740  BP:  104/65  Pulse:  79  Resp:  20  Temp:  (!) 36.1 C  SpO2: 90% 94%    Last Pain:  Vitals:   02/13/21 1740  TempSrc:   PainSc: 0-No pain                 Nolon Nations

## 2021-02-16 LAB — AEROBIC/ANAEROBIC CULTURE W GRAM STAIN (SURGICAL/DEEP WOUND): Gram Stain: NONE SEEN

## 2021-02-18 LAB — AEROBIC/ANAEROBIC CULTURE W GRAM STAIN (SURGICAL/DEEP WOUND): Culture: NO GROWTH

## 2021-02-23 ENCOUNTER — Ambulatory Visit (INDEPENDENT_AMBULATORY_CARE_PROVIDER_SITE_OTHER): Payer: Medicare Other | Admitting: Orthopedic Surgery

## 2021-02-23 ENCOUNTER — Other Ambulatory Visit: Payer: Self-pay

## 2021-02-23 ENCOUNTER — Encounter: Payer: Self-pay | Admitting: Orthopedic Surgery

## 2021-02-23 VITALS — BP 106/69 | HR 83

## 2021-02-23 DIAGNOSIS — L089 Local infection of the skin and subcutaneous tissue, unspecified: Secondary | ICD-10-CM

## 2021-02-23 NOTE — Progress Notes (Signed)
Post-Op Visit Note   Patient: Nathaniel White           Date of Birth: Dec 03, 1960           MRN: [id number] Visit Date: 02/23/2021 PCP: Dorna Mai, MD   Assessment & Plan:  Chief Complaint:  Chief Complaint  Patient presents with   Left Little Finger - Routine Post Op   Visit Diagnoses:  1. Finger infection     Plan: Patient is one week s/p left small finger amputation through the PIP joint for chronic infection.  His wound is clean, dry, and well approximated.  He has mild pain at the very end of the residual finger but no other pain.  He will keep today's dressing on through the weekend then start daily dressing changes and washing his hand with warm, soapy water.  I'll see him back next week.   Follow-Up Instructions: No follow-ups on file.   Orders:  No orders of the defined types were placed in this encounter.  No orders of the defined types were placed in this encounter.   Imaging: No results found.  PMFS History: Patient Active Problem List   Diagnosis Date Noted   Finger infection    Paronychia of finger, left 02/03/2021   Left hand pain 12/29/2020   Pressure ulcer of ankle 09/01/2018   Thrombocytopenia ([place]) 01/17/2015   Transfusion history 01/17/2015   Hemodialysis AV fistula aneurysm ([place]) 11/10/2013   Cough 95/62/1308   CHF, systolic dysfunction 65/78/4696   Chronic obstructive pulmonary disease ([place]) 07/06/2012   Cerebral embolism with cerebral infarction ([place]) 08/13/2011   ESRD (end stage renal disease) on dialysis ([place]) 03/13/2011   HTN (hypertension) 03/13/2011   H/O: CVA (cardiovascular accident) 03/13/2011   Anemia 03/13/2011   Fluid overload 03/13/2011   History of nonadherence to medical treatment 03/13/2011   Tobacco abuse 03/13/2011   HEMATOCHEZIA 10/04/2008   Past Medical History:  Diagnosis Date   Anemia    Anxiety    Asthma    CHF (congestive heart failure) ([place])    Complication from renal dialysis device 29/52/8413    Complication of anesthesia 2017   according to pt and spouse pt was moving around and cough while under and pt had difficulty waking up so the anesthesia had to be reversed. and pt admitted to ICU.    COPD (chronic obstructive pulmonary disease) ([facility])    Diabetes mellitus without complication ([place])    Type II - Patient states he does not have diabetes, "they said sometimes when you start dialysis you don't have diabetes any longer"    ESOPHAGEAL VARICES 10/04/2008   Qualifier: Diagnosis of  By: Fuller Plan MD Lamont Snowball T    ESRD    on HD, T-TH-Sat - [place]   Hemiparesis due to old stroke [facility])    left   Hypertension    Hx, not current problems, no meds   LV dysfunction    EF 25-30% by echo 07/2011   Memory loss due to medical condition    due to stroke   MR (mitral regurgitation)    moderate to severe, echo 07/2011   Peripheral vascular disease ([place])    Shortness of breath    with exertion   Stroke ([place])    TIA's-left sided weakness   Tobacco abuse     Family History  Problem Relation Age of Onset   Hypertension Mother    Varicose Veins Mother    Diabetes Paternal Grandmother  Cancer Paternal Grandfather    CAD Paternal Uncle     Past Surgical History:  Procedure Laterality Date   ABDOMINAL AORTOGRAM W/LOWER EXTREMITY Bilateral 07/21/2018   Procedure: ABDOMINAL AORTOGRAM W/LOWER EXTREMITY;  Surgeon: Waynetta Sandy, MD;  Location: [place] [facility];  Service: Cardiovascular;  Laterality: Bilateral;   AMPUTATION Left 11/05/2018   Procedure: AMPUTATION SECOND TOE LEFT FOOT;  Surgeon: Waynetta Sandy, MD;  Location: [place];  Service: Vascular;  Laterality: Left;   AMPUTATION Left 02/13/2021   Procedure: Left small finger AMPUTATION DIGIT;  Surgeon: Sherilyn Cooter, MD;  Location: [place];  Service: Orthopedics;  Laterality: Left;   AV FISTULA PLACEMENT     CARDIAC CATHETERIZATION     [place] [facility]   COLONOSCOPY W/ BIOPSIES AND POLYPECTOMY     FISTULA  SUPERFICIALIZATION Left 11/10/2013   Procedure: FISTULA PLICATION;  Surgeon: Serafina Mitchell, MD;  Location: [facility] OR;  Service: Vascular;  Laterality: Left;   KIDNEY TRANSPLANT     2011 rejected kidney 2012 back on dialysis   LEFT AND RIGHT HEART CATHETERIZATION WITH CORONARY ANGIOGRAM N/A 10/09/2013   Procedure: LEFT AND RIGHT HEART CATHETERIZATION WITH CORONARY ANGIOGRAM;  Surgeon: Burnell Blanks, MD;  Location: [facility] CATH LAB;  Service: Cardiovascular;  Laterality: N/A;   PERIPHERAL VASCULAR INTERVENTION Left 07/21/2018   Procedure: PERIPHERAL VASCULAR INTERVENTION;  Surgeon: Waynetta Sandy, MD;  Location: [place] CV LAB;  Service: Cardiovascular;  Laterality: Left;  Popliteal   REVISON OF ARTERIOVENOUS FISTULA Left 08/26/2013   Procedure: EXCISION OF ERODED SKIN AND EXPLORATION OF MAIN LEFT UPPER ARM AV FISTULA;  Surgeon: Rosetta Posner, MD;  Location: [place];  Service: Vascular;  Laterality: Left;   REVISON OF ARTERIOVENOUS FISTULA Left 02/05/2014   Procedure: REPAIR OF ARTERIOVENOUS FISTULA ANEURYSM;  Surgeon: Serafina Mitchell, MD;  Location: [place];  Service: Vascular;  Laterality: Left;   SHUNTOGRAM N/A 11/05/2012   Procedure: Fistulogram;  Surgeon: Serafina Mitchell, MD;  Location: [facility] CATH LAB;  Service: Cardiovascular;  Laterality: N/A;   TEE WITHOUT CARDIOVERSION  08/17/2011   Procedure: TRANSESOPHAGEAL ECHOCARDIOGRAM (TEE);  Surgeon: Larey Dresser, MD;  Location: [facility] ENDOSCOPY;  Service: Cardiovascular;  Laterality: N/A;   [place] (VATS)/DECORTICATION  08/10/11   Social History   Occupational History   Occupation: Geographical information systems officer: DISABLED  Tobacco Use   Smoking status: Former    Packs/day: 0.50    Years: 15.00    Pack years: 7.50    Types: Cigarettes    Quit date: 2014    Years since quitting: 9.1   Smokeless tobacco: Never  Vaping Use   Vaping Use: Never used  Substance and Sexual Activity   Alcohol use: No     Alcohol/week: 0.0 standard drinks   Drug use: No   Sexual activity: Not Currently

## 2021-03-02 ENCOUNTER — Telehealth: Payer: Self-pay

## 2021-03-02 ENCOUNTER — Other Ambulatory Visit: Payer: Self-pay

## 2021-03-02 ENCOUNTER — Ambulatory Visit (INDEPENDENT_AMBULATORY_CARE_PROVIDER_SITE_OTHER): Payer: Medicare Other | Admitting: Orthopedic Surgery

## 2021-03-02 DIAGNOSIS — L03012 Cellulitis of left finger: Secondary | ICD-10-CM

## 2021-03-02 NOTE — Telephone Encounter (Signed)
Patient called reporting continued pain from his recent left fifth finger amputation and requested to postpone his left arm AVF revision to next week if possible. Surgery moved to 03/10/21. Pt verbalize understanding.

## 2021-03-02 NOTE — Progress Notes (Signed)
Post-Op Visit Note   Patient: Nathaniel White           Date of Birth: May 13, 1960           MRN: [id number] Visit Date: 03/02/2021 PCP: Dorna Mai, MD   Assessment & Plan:  Chief Complaint:  Chief Complaint  Patient presents with   Other    02/13/21 left small finger amputation   Visit Diagnoses:  1. Paronychia of finger, left     Plan: Patient is now 17 days out from left small finger amputation.  Doing well postoperatively.  No complaints.  He can continue to keep the area clean and dry.  He can wash with warm, soapy water. I can see him back in another two weeks if he has any issues or problems.   Follow-Up Instructions: No follow-ups on file.   Orders:  No orders of the defined types were placed in this encounter.  No orders of the defined types were placed in this encounter.   Imaging: No results found.  PMFS History: Patient Active Problem List   Diagnosis Date Noted   Finger infection    Paronychia of finger, left 02/03/2021   Left hand pain 12/29/2020   Pressure ulcer of ankle 09/01/2018   Thrombocytopenia ([place]) 01/17/2015   Transfusion history 01/17/2015   Hemodialysis AV fistula aneurysm ([place]) 11/10/2013   Cough 46/28/6381   CHF, systolic dysfunction 77/11/6577   Chronic obstructive pulmonary disease ([place]) 07/06/2012   Cerebral embolism with cerebral infarction ([place]) 08/13/2011   ESRD (end stage renal disease) on dialysis ([place]) 03/13/2011   HTN (hypertension) 03/13/2011   H/O: CVA (cardiovascular accident) 03/13/2011   Anemia 03/13/2011   Fluid overload 03/13/2011   History of nonadherence to medical treatment 03/13/2011   Tobacco abuse 03/13/2011   HEMATOCHEZIA 10/04/2008   Past Medical History:  Diagnosis Date   Anemia    Anxiety    Asthma    CHF (congestive heart failure) ([place])    Complication from renal dialysis device 03/83/3383   Complication of anesthesia 2017   according to pt and spouse pt was moving around and cough while  under and pt had difficulty waking up so the anesthesia had to be reversed. and pt admitted to ICU.    COPD (chronic obstructive pulmonary disease) (HCC)    Diabetes mellitus without complication ([place])    Type II - Patient states he does not have diabetes, "they said sometimes when you start dialysis you don't have diabetes any longer"    ESOPHAGEAL VARICES 10/04/2008   Qualifier: Diagnosis of  By: Fuller Plan MD Lamont Snowball T    ESRD    on HD, T-TH-Sat - [place]   Hemiparesis due to old stroke [facility])    left   Hypertension    Hx, not current problems, no meds   LV dysfunction    EF 25-30% by echo 07/2011   Memory loss due to medical condition    due to stroke   MR (mitral regurgitation)    moderate to severe, echo 07/2011   Peripheral vascular disease ([place])    Shortness of breath    with exertion   Stroke ([place])    TIA's-left sided weakness   Tobacco abuse     Family History  Problem Relation Age of Onset   Hypertension Mother    Varicose Veins Mother    Diabetes Paternal Grandmother    Cancer Paternal Grandfather    CAD Paternal Uncle     Past Surgical  History:  Procedure Laterality Date   ABDOMINAL AORTOGRAM W/LOWER EXTREMITY Bilateral 07/21/2018   Procedure: ABDOMINAL AORTOGRAM W/LOWER EXTREMITY;  Surgeon: Waynetta Sandy, MD;  Location: [place] [facility];  Service: Cardiovascular;  Laterality: Bilateral;   AMPUTATION Left 11/05/2018   Procedure: AMPUTATION SECOND TOE LEFT FOOT;  Surgeon: Waynetta Sandy, MD;  Location: [place];  Service: Vascular;  Laterality: Left;   AMPUTATION Left 02/13/2021   Procedure: Left small finger AMPUTATION DIGIT;  Surgeon: Sherilyn Cooter, MD;  Location: [place];  Service: Orthopedics;  Laterality: Left;   AV FISTULA PLACEMENT     CARDIAC CATHETERIZATION     [place] medical   COLONOSCOPY W/ BIOPSIES AND POLYPECTOMY     FISTULA SUPERFICIALIZATION Left 11/10/2013   Procedure: FISTULA PLICATION;  Surgeon: Serafina Mitchell, MD;   Location: [facility] OR;  Service: Vascular;  Laterality: Left;   KIDNEY TRANSPLANT     2011 rejected kidney 2012 back on dialysis   LEFT AND RIGHT HEART CATHETERIZATION WITH CORONARY ANGIOGRAM N/A 10/09/2013   Procedure: LEFT AND RIGHT HEART CATHETERIZATION WITH CORONARY ANGIOGRAM;  Surgeon: Burnell Blanks, MD;  Location: [facility] CATH LAB;  Service: Cardiovascular;  Laterality: N/A;   PERIPHERAL VASCULAR INTERVENTION Left 07/21/2018   Procedure: PERIPHERAL VASCULAR INTERVENTION;  Surgeon: Waynetta Sandy, MD;  Location: [place] CV LAB;  Service: Cardiovascular;  Laterality: Left;  Popliteal   REVISON OF ARTERIOVENOUS FISTULA Left 08/26/2013   Procedure: EXCISION OF ERODED SKIN AND EXPLORATION OF MAIN LEFT UPPER ARM AV FISTULA;  Surgeon: Rosetta Posner, MD;  Location: [place];  Service: Vascular;  Laterality: Left;   REVISON OF ARTERIOVENOUS FISTULA Left 02/05/2014   Procedure: REPAIR OF ARTERIOVENOUS FISTULA ANEURYSM;  Surgeon: Serafina Mitchell, MD;  Location: [place];  Service: Vascular;  Laterality: Left;   SHUNTOGRAM N/A 11/05/2012   Procedure: Fistulogram;  Surgeon: Serafina Mitchell, MD;  Location: [facility] CATH LAB;  Service: Cardiovascular;  Laterality: N/A;   TEE WITHOUT CARDIOVERSION  08/17/2011   Procedure: TRANSESOPHAGEAL ECHOCARDIOGRAM (TEE);  Surgeon: Larey Dresser, MD;  Location: [facility] ENDOSCOPY;  Service: Cardiovascular;  Laterality: N/A;   [place] (VATS)/DECORTICATION  08/10/11   Social History   Occupational History   Occupation: Geographical information systems officer: DISABLED  Tobacco Use   Smoking status: Former    Packs/day: 0.50    Years: 15.00    Pack years: 7.50    Types: Cigarettes    Quit date: 2014    Years since quitting: 9.1   Smokeless tobacco: Never  Vaping Use   Vaping Use: Never used  Substance and Sexual Activity   Alcohol use: No    Alcohol/week: 0.0 standard drinks   Drug use: No   Sexual activity: Not Currently

## 2021-03-09 ENCOUNTER — Other Ambulatory Visit: Payer: Self-pay

## 2021-03-09 ENCOUNTER — Encounter (HOSPITAL_COMMUNITY): Payer: Self-pay | Admitting: Vascular Surgery

## 2021-03-09 NOTE — Progress Notes (Signed)
PCP - Dr. Redmond Pulling ?Cardiologist - pt denies ?EKG - 02/13/21 ?Chest x-ray -  ?ECHO - 01/21/19 ?Cardiac Cath - 2015 ?CPAP -  ?Fasting Blood Sugar:  pt does not know ?Checks Blood Sugar:  pt does not check CBG at home  ?Blood Thinner Instructions:  ?Aspirin Instructions: Follow your surgeon's instructions on when to stop Aspirin.  If no instructions were given by your surgeon then you will need to call the office to get those instructions.   ?ERAS Protcol - no - clears 10:15 AM ?COVID TEST- n/a ? ?Anesthesia review: yes ? ?------------- ? ?SDW INSTRUCTIONS: ? ?Your procedure is scheduled on Friday 3/3. Please report to [facility] Main Entrance "A" at 10:45 A.M., and check in at the Admitting office. Call this number if you have problems the morning of surgery: [phone] ? ? ?Remember: Do not eat after midnight the night before your surgery ? ?You may drink clear liquids until 10:15 AM the morning of your surgery.   ?Clear liquids allowed are: Water, Non-Citrus Juices (without pulp), Carbonated Beverages, Clear Tea, Black Coffee Only, and Gatorade (do NOT add anything to drinks)  ?  ?Medications to take morning of surgery with a sip of water include: ?Tylenol if needed ?oxyCODONE (ROXICODONE) if needed ?Please bring all inhalers with you the day of surgery.  ? ?Follow your surgeon's instructions on when to stop Aspirin.  If no instructions were given by your surgeon then you will need to call the office to get those instructions.   ? ?(Pt does not check CBG at home - diabetes instructions not given) ? ?As of today, STOP taking any Aleve, Naproxen, Ibuprofen, Motrin, Advil, Goody's, BC's, all herbal medications, fish oil, and all vitamins. ? ?  ?The Morning of Surgery ?Do not wear jewelry ?Do not wear lotions, powders, colognes, or deodorant ?Do not bring valuables to the hospital. ?[facility] is not responsible for any belongings or valuables. ? ?If you are a smoker, DO NOT Smoke 24 hours prior to surgery ? ?If you wear  a CPAP at night please bring your mask the morning of surgery  ? ?Remember that you must have someone to transport you home after your surgery, and remain with you for 24 hours if you are discharged the same day. ? ?Please bring cases for contacts, glasses, hearing aids, dentures or bridgework because it cannot be worn into surgery.  ? ?Patients discharged the day of surgery will not be allowed to drive home.  ? ?Please shower the NIGHT BEFORE/MORNING OF SURGERY (use antibacterial soap like DIAL soap if possible). Wear comfortable clothes the morning of surgery. Oral Hygiene is also important to reduce your risk of infection.  Remember - BRUSH YOUR TEETH THE MORNING OF SURGERY WITH YOUR REGULAR TOOTHPASTE ? ?Patient denies shortness of breath, fever, cough and chest pain.  ? ? ?   ? ?

## 2021-03-09 NOTE — Progress Notes (Signed)
Anesthesia Chart Review: Kathleene Hazel  Case: [id number] Date/Time: 03/10/21 1257   Procedure: LEFT ARM REVISION OF ARTERIOVENOUS FISTULA (Left)   Anesthesia type: Choice   Pre-op diagnosis: Aneurysm of Arteriovenous Fistula   Location: [place] OR ROOM 12 / [place] [facility]   Surgeons: Waynetta Sandy, MD       DISCUSSION: Patient is a 61 year old male scheduled for the above procedure. He has undergone multiple plications of LUE AVF. More recently with bleeding for a scabbed region. He his hoping to salvage the AVF, and the above surgery planned.  History includes former smoker, non-ischemic cardiomyopathy (normal coronaries 2008, 2015), HFrEF, mitral regurgitation (mild 01/2019 echo), HTN, ESRD (DDKT 11/28/08 at [facility] with graft failure, restarted HD 12/20211; HD TTS), anemia, CVA (2004), COPD, recurrent right pleural effusion (s/p right VAT/mini-thoracotomy & decortication RLL 08/10/11), PAD (eft popliteal artery stent 07/21/18; left 2nd toe amputation 11/05/18), left 5th finger paronychia (s/p amputation 02/13/21). Denied DM.   Normal coronaries in 2015. Stable LVEF 40-45%, mild MR 01/2019 echo.  He is a same-day work-up, anesthesia team to evaluate on the day of surgery.   VS:  BP Readings from Last 3 Encounters:  02/23/21 106/69  02/13/21 104/65  02/08/21 (!) 152/86   Pulse Readings from Last 3 Encounters:  02/23/21 83  02/13/21 79  02/08/21 64     PROVIDERS: Dorna Mai, MD is PCP  - Lauree Chandler, MD is cardiologist. Last visit 11/21/18 to get re-established for non-ischemic cardiomyopathy and mitral regurgitation. Echo ordered (done 01/2019) and showed stable LVEF 40-45% and mild MR. 12 month follow-up had been planned.  - He saw pulmonologist Asencion Noble, MD last on 09/01/18 at the [facility] on arrival. Last results in [facility] include: Lab Results  Component Value Date   HGB 16.3 02/13/2021   HCT 48.0 02/13/2021    GLUCOSE 76 02/13/2021   NA 138 02/13/2021   K 5.6 (H) 02/13/2021   CL 97 (L) 02/13/2021   CREATININE 11.90 (H) 02/13/2021   BUN 67 (H) 02/13/2021     EKG: 02/13/21:  Undetermined rhythm Indeterminate axis Incomplete right bundle branch block Minimal voltage criteria for LVH, may be normal variant ( Cornell product ) Septal infarct , age undetermined Lateral infarct , age undetermined Prolonged QT  [QT 430, QTc 517 ms] Abnormal ECG When compared with ECG of 03-Mar-2018 22:18, frequent PVC's are noted - Inferior T wave abnormality is less prominent when compared to 11/21/18 tracing. There are more PVCs on current EKG. May have incomplete left BBB pattern (which was also present on 08/08/11 tracing), but consecutive PVCs in leads V4-6 interfering with interpretation.    CV: Echo 01/21/19: IMPRESSIONS   1. The left ventricle demonstrates global hypokinesis.   2. Left ventricular ejection fraction, by visual estimation, is 40-45%.  The left ventricle has normal function. Left ventricular septal wall  thickness was mildly increased. Mildly increased left ventricular  posterior wall thickness.   3. Left ventricular diastolic parameters are consistent with Grade II  diastolic dysfunction (pseudonormalization).   4. Elevated left ventricular end-diastolic pressure.   5. Global right ventricle has moderately reduced systolic function.The  right ventricular size is normal. No increase in right ventricular wall  thickness.   6. Left atrial size was severely dilated.   7. Right atrial size was severely dilated.   8. Mild calcification of the anterior mitral valve leaflet(s). Moderate  thickening of the anterior mitral valve  leaflet(s). Mild mitral valve  regurgitation. No evidence of mitral stenosis.   9. The tricuspid valve is normal in structure.  10. The aortic valve is tricuspid. Aortic valve regurgitation is not  visualized. Mild to moderate aortic valve sclerosis/calcification without   any evidence of aortic stenosis.  11. The pulmonic valve was normal in structure. Pulmonic valve  regurgitation is not visualized.  12. Moderately elevated pulmonary artery systolic pressure.  13. The inferior vena cava is normal in size with greater than 50%  respiratory variability, suggesting right atrial pressure of 3 mmHg.  - Comparison echo 09/30/13: LVSF mildly to moderately reduced, EF 40-45%, inferoseptal hypokinesis, moderate MR, moderately dilated LA, mildly dilated RA/RV, PA peak pressure 58 mmHg.   RHC/LHC 10/09/13: Impression: 1. No angiographic evidence of CAD 2. Non-cardiac chest pain 3. Elevated filling pressures  Recommendations: His chest pain and dyspnea are likely related to his volume overload and underlying lung disease. No evidence of CAD. Would try more aggressive volume removal with HD. Further pulmonary workup per pulmonary.  - Known non-ischemic cardiomyopathy, LVEF 30-35% by 08/17/11 TEE, 40-45% 09/30/13 TTE   US Carotid 08/13/11: Summary:  No significant extracranial carotid artery stenosis  demonstrated. Veterbrals are patent with antegrade flow.     Past Medical History:  Diagnosis Date   Anemia    Anxiety    Asthma    CHF (congestive heart failure) ([place])    Complication from renal dialysis device [id number]/42/5956   Complication of anesthesia 2017   according to pt and spouse pt was moving around and cough while under and pt had difficulty waking up so the anesthesia had to be reversed. and pt admitted to ICU.    COPD (chronic obstructive pulmonary disease) ([facility])    Diabetes mellitus without complication ([place])    Type II - Patient states he does not have diabetes, "they said sometimes when you start dialysis you don't have diabetes any longer"    ESOPHAGEAL VARICES 10/04/2008   Qualifier: Diagnosis of  By: Fuller Plan MD Lamont Snowball T    ESRD    on HD, T-TH-Sat - [place] [facility]   Hemiparesis due to old stroke [facility])    left   Hypertension    Hx, not current  problems, no meds   LV dysfunction    EF 25-30% by echo 07/2011   Memory loss due to medical condition    due to stroke   MR (mitral regurgitation)    moderate to severe, echo 07/2011   Peripheral vascular disease ([place])    Shortness of breath    with exertion   Stroke ([place])    TIA's-left sided weakness   Tobacco abuse     Past Surgical History:  Procedure Laterality Date   ABDOMINAL AORTOGRAM W/LOWER EXTREMITY Bilateral 07/21/2018   Procedure: ABDOMINAL AORTOGRAM W/LOWER EXTREMITY;  Surgeon: Waynetta Sandy, MD;  Location: [place] CV LAB;  Service: Cardiovascular;  Laterality: Bilateral;   AMPUTATION Left 11/05/2018   Procedure: AMPUTATION SECOND TOE LEFT FOOT;  Surgeon: Waynetta Sandy, MD;  Location: [place];  Service: Vascular;  Laterality: Left;   AMPUTATION Left 02/13/2021   Procedure: Left small finger AMPUTATION DIGIT;  Surgeon: Sherilyn Cooter, MD;  Location: [place];  Service: Orthopedics;  Laterality: Left;   AV FISTULA PLACEMENT     CARDIAC CATHETERIZATION     [facility]   COLONOSCOPY W/ BIOPSIES AND POLYPECTOMY     FISTULA SUPERFICIALIZATION Left 11/10/2013   Procedure: FISTULA PLICATION;  Surgeon: Butch Penny  Trula Slade, MD;  Location: [place];  Service: Vascular;  Laterality: Left;   KIDNEY TRANSPLANT     2011 rejected kidney 2012 back on dialysis   LEFT AND RIGHT HEART CATHETERIZATION WITH CORONARY ANGIOGRAM N/A 10/09/2013   Procedure: LEFT AND RIGHT HEART CATHETERIZATION WITH CORONARY ANGIOGRAM;  Surgeon: Burnell Blanks, MD;  Location: [facility] CATH LAB;  Service: Cardiovascular;  Laterality: N/A;   PERIPHERAL VASCULAR INTERVENTION Left 07/21/2018   Procedure: PERIPHERAL VASCULAR INTERVENTION;  Surgeon: Waynetta Sandy, MD;  Location: [place] CV LAB;  Service: Cardiovascular;  Laterality: Left;  Popliteal   REVISON OF ARTERIOVENOUS FISTULA Left 08/26/2013   Procedure: EXCISION OF ERODED SKIN AND EXPLORATION OF MAIN LEFT UPPER ARM AV  FISTULA;  Surgeon: Rosetta Posner, MD;  Location: [place];  Service: Vascular;  Laterality: Left;   REVISON OF ARTERIOVENOUS FISTULA Left 02/05/2014   Procedure: REPAIR OF ARTERIOVENOUS FISTULA ANEURYSM;  Surgeon: Serafina [place], MD;  Location: [place];  Service: Vascular;  Laterality: Left;   SHUNTOGRAM N/A 11/05/2012   Procedure: Fistulogram;  Surgeon: Serafina [place], MD;  Location: [facility] CATH LAB;  Service: Cardiovascular;  Laterality: N/A;   TEE WITHOUT CARDIOVERSION  08/17/2011   Procedure: TRANSESOPHAGEAL ECHOCARDIOGRAM (TEE);  Surgeon: Larey Dresser, MD;  Location: [facility] ENDOSCOPY;  Service: Cardiovascular;  Laterality: N/A;   VIDEO ASSISTED THORACOSCOPY (VATS)/DECORTICATION  08/10/11    MEDICATIONS: No current facility-administered medications for this encounter.    acetaminophen (TYLENOL) 500 MG tablet   albuterol (PROAIR HFA) 108 (90 Base) MCG/ACT inhaler   aspirin EC 81 MG tablet   bacitracin 500 UNIT/GM ointment   cephALEXin (KEFLEX) 500 MG capsule   fluticasone-salmeterol (ADVAIR DISKUS) 250-50 MCG/ACT AEPB   glucose blood test strip   montelukast (SINGULAIR) 10 MG tablet   oxyCODONE (ROXICODONE) 5 MG immediate release tablet    Myra Gianotti, PA-C Surgical Short Stay/Anesthesiology [facility] Phone [phone] [facility] Phone [phone] 03/09/2021 2:20 PM

## 2021-03-09 NOTE — Anesthesia Preprocedure Evaluation (Addendum)
Anesthesia Evaluation  ?Patient identified by MRN, date of birth, ID band ?Patient awake ? ? ? ?Reviewed: ?Allergy & Precautions, NPO status , Patient's Chart, lab work & pertinent test results ? ?History of Anesthesia Complications ?Negative for: history of anesthetic complications ? ?Airway ?Mallampati: I ? ?TM Distance: >3 FB ?Neck ROM: Full ? ? ? Dental ? ?(+) Dental Advisory Given, Teeth Intact ?  ?Pulmonary ?asthma , COPD,  COPD inhaler, former smoker,  ?  ?Pulmonary exam normal ? ? ? ? ? ? ? Cardiovascular ?hypertension, pulmonary hypertension+ Peripheral Vascular Disease and +CHF  ?Normal cardiovascular exam ? ? ?'21 TTE - global hypokinesis. EF is 40-45%. Left ventricular septal wall thickness was mildly increased. Mildly increased left ventricular posterior wall thickness.  Grade II diastolic dysfunction (pseudonormalization). Global right ventricle has moderately reduced systolic function. Left atrial size was severely dilated. Right atrial size was severely dilated. Mild mitral valve regurgitation.  Mild to moderate aortic valve sclerosis/calcification without any evidence of aortic stenosis. Moderately elevated pulmonary artery systolic pressure.  ? ?  ?Neuro/Psych ?PSYCHIATRIC DISORDERS Anxiety TIACVA, Residual Symptoms   ? GI/Hepatic ?Neg liver ROS,   ?Endo/Other  ? ?Pre-DM ? ? Renal/GU ?ESRF and DialysisRenal disease  ? ?  ?Musculoskeletal ?negative musculoskeletal ROS ?(+)  ? Abdominal ?  ?Peds ? Hematology ?negative hematology ROS ?(+)   ?Anesthesia Other Findings ? ? Reproductive/Obstetrics ? ?  ? ? ? ? ? ? ? ? ? ? ? ? ? ?  ?  ? ? ? ? ? ?                                  Anesthesia Evaluation  ?Patient identified by MRN, date of birth, ID band ?Patient awake ? ? ? ?Reviewed: ?Allergy & Precautions, NPO status , Patient's Chart, lab work & pertinent test results ? ?Airway ?Mallampati: II ? ?TM Distance: >3 FB ?Neck ROM: Full ? ? ? Dental ?no notable dental hx. ? ?   ?Pulmonary ?asthma , COPD, Recent URI  (New, productive cough), former smoker,  ?  ?Pulmonary exam normal ?breath sounds clear to auscultation ? ? ? ? ? ? Cardiovascular ?hypertension, + Peripheral Vascular Disease and +CHF  ?Normal cardiovascular exam+ Valvular Problems/Murmurs MR  ?Rhythm:Regular Rate:Normal ? ?Undetermined rhythm ?Indeterminate axis ?Incomplete right bundle branch block ?Minimal voltage criteria for LVH, may be normal variant ( Cornell product ) ?Septal infarct , age undetermined ?Lateral infarct , age undetermined ?Prolonged QT ?Abnormal ECG ?When compared with ECG of 03-Mar-2018 22:18, ?PREVIOUS ECG IS PRESENT ? ? ?ECHO 2021: ??1. The left ventricle demonstrates global hypokinesis.  ??2. Left ventricular ejection fraction, by visual estimation, is 40-45%.  ?The left ventricle has normal function. Left ventricular septal wall  ?thickness was mildly increased. Mildly increased left ventricular  ?posterior wall thickness.  ??3. Left ventricular diastolic parameters are consistent with Grade II  ?diastolic dysfunction (pseudonormalization).  ??4. Elevated left ventricular end-diastolic pressure.  ??5. Global right ventricle has moderately reduced systolic function.The  ?right ventricular size is normal. No increase in right ventricular wall  ?thickness.  ??6. Left atrial size was severely dilated.  ??7. Right atrial size was severely dilated.  ??8. Mild calcification of the anterior mitral valve leaflet(s). Moderate  ?thickening of the anterior mitral valve leaflet(s). Mild mitral valve  ?regurgitation. No evidence of mitral stenosis.  ??9. The tricuspid valve is normal in structure.  ?10. The aortic valve is tricuspid.  Aortic valve regurgitation is not  ?visualized. Mild to moderate aortic valve sclerosis/calcification without  ?any evidence of aortic stenosis.  ?11. The pulmonic valve was normal in structure. Pulmonic valve  ?regurgitation is not visualized.  ?12. Moderately elevated pulmonary  artery systolic pressure.  ?13. The inferior vena cava is normal in size with greater than 50%  ?respiratory variability, suggesting right atrial pressure of 3 mmHg.  ?  ?Neuro/Psych ?PSYCHIATRIC DISORDERS Anxiety CVA (left sided weakness), Residual Symptoms   ? GI/Hepatic ?negative GI ROS, Neg liver ROS,   ?Endo/Other  ?negative endocrine ROSdiabetes ? Renal/GU ?Dialysis and ESRFRenal disease (T/Th/Sat)  ?negative genitourinary ?  ?Musculoskeletal ?negative musculoskeletal ROS ?(+)  ? Abdominal ?  ?Peds ?negative pediatric ROS ?(+)  Hematology ? ?(+) Blood dyscrasia, anemia ,   ?Anesthesia Other Findings ? ? Reproductive/Obstetrics ?negative OB ROS ? ?  ? ? ? ? ? ? ? ? ? ? ? ? ? ?  ?  ? ? ? ? ? ? ? ?Anesthesia Physical ?Anesthesia Plan ? ?ASA: 4 ? ?Anesthesia Plan: MAC  ? ?Post-op Pain Management: Minimal or no pain anticipated  ? ?Induction: Intravenous ? ?PONV Risk Score and Plan: Ondansetron, Propofol infusion and Treatment may vary due to age or medical condition ? ?Airway Management Planned: Natural Airway and Simple Face Mask ? ?Additional Equipment:  ? ?Intra-op Plan:  ? ?Post-operative Plan: Extubation in OR ? ?Informed Consent: I have reviewed the patients History and Physical, chart, labs and discussed the procedure including the risks, benefits and alternatives for the proposed anesthesia with the patient or authorized representative who has indicated his/her understanding and acceptance.  ? ? ? ?Dental advisory given ? ?Plan Discussed with: CRNA, Anesthesiologist and Surgeon ? ?Anesthesia Plan Comments: (Local by surgeon. Propofol gtt. Norton Blizzard, MD  ?)  ? ? ? ? ?Anesthesia Quick Evaluation ? ?Anesthesia Physical ?Anesthesia Plan ? ?ASA: 4 ? ?Anesthesia Plan: MAC  ? ?Post-op Pain Management:   ? ?Induction:  ? ?PONV Risk Score and Plan: 1 and Propofol infusion and Treatment may vary due to age or medical condition ? ?Airway Management Planned: Natural Airway and Simple Face Mask ? ?Additional  Equipment: None ? ?Intra-op Plan:  ? ?Post-operative Plan:  ? ?Informed Consent: I have reviewed the patients History and Physical, chart, labs and discussed the procedure including the risks, benefits and alternatives for the proposed anesthesia with the patient or authorized representative who has indicated his/her understanding and acceptance.  ? ? ? ? ? ?Plan Discussed with: CRNA and Anesthesiologist ? ?Anesthesia Plan Comments:   ? ? ? ? ?Anesthesia Quick Evaluation ? ?

## 2021-03-10 ENCOUNTER — Encounter (HOSPITAL_COMMUNITY)
Admission: AD | Disposition: A | Discharge status: Home Health | Payer: Self-pay | Source: Home / Self Care | Attending: Internal Medicine

## 2021-03-10 ENCOUNTER — Ambulatory Visit (HOSPITAL_COMMUNITY): Payer: Medicare Other | Admitting: Physician Assistant

## 2021-03-10 ENCOUNTER — Inpatient Hospital Stay (HOSPITAL_COMMUNITY)
Admission: AD | Admit: 2021-03-10 | Discharge: 2021-03-15 | DRG: 270 | Disposition: A | Discharge status: Home Health | Payer: Medicare Other | Attending: Internal Medicine | Admitting: Internal Medicine

## 2021-03-10 ENCOUNTER — Inpatient Hospital Stay (HOSPITAL_COMMUNITY): Payer: Medicare Other

## 2021-03-10 ENCOUNTER — Other Ambulatory Visit: Payer: Self-pay

## 2021-03-10 ENCOUNTER — Ambulatory Visit (HOSPITAL_COMMUNITY): Payer: Medicare Other

## 2021-03-10 ENCOUNTER — Encounter (HOSPITAL_COMMUNITY): Payer: Self-pay | Admitting: Vascular Surgery

## 2021-03-10 DIAGNOSIS — I428 Other cardiomyopathies: Secondary | ICD-10-CM | POA: Diagnosis present

## 2021-03-10 DIAGNOSIS — E876 Hypokalemia: Secondary | ICD-10-CM | POA: Diagnosis not present

## 2021-03-10 DIAGNOSIS — Z940 Kidney transplant status: Secondary | ICD-10-CM

## 2021-03-10 DIAGNOSIS — I69354 Hemiplegia and hemiparesis following cerebral infarction affecting left non-dominant side: Secondary | ICD-10-CM | POA: Diagnosis not present

## 2021-03-10 DIAGNOSIS — E1122 Type 2 diabetes mellitus with diabetic chronic kidney disease: Secondary | ICD-10-CM | POA: Diagnosis present

## 2021-03-10 DIAGNOSIS — I5043 Acute on chronic combined systolic (congestive) and diastolic (congestive) heart failure: Secondary | ICD-10-CM | POA: Diagnosis present

## 2021-03-10 DIAGNOSIS — I4891 Unspecified atrial fibrillation: Secondary | ICD-10-CM | POA: Diagnosis not present

## 2021-03-10 DIAGNOSIS — D649 Anemia, unspecified: Secondary | ICD-10-CM | POA: Diagnosis present

## 2021-03-10 DIAGNOSIS — J9621 Acute and chronic respiratory failure with hypoxia: Secondary | ICD-10-CM | POA: Diagnosis not present

## 2021-03-10 DIAGNOSIS — Z978 Presence of other specified devices: Secondary | ICD-10-CM

## 2021-03-10 DIAGNOSIS — N186 End stage renal disease: Secondary | ICD-10-CM | POA: Diagnosis present

## 2021-03-10 DIAGNOSIS — I721 Aneurysm of artery of upper extremity: Secondary | ICD-10-CM | POA: Diagnosis present

## 2021-03-10 DIAGNOSIS — F419 Anxiety disorder, unspecified: Secondary | ICD-10-CM | POA: Diagnosis present

## 2021-03-10 DIAGNOSIS — I132 Hypertensive heart and chronic kidney disease with heart failure and with stage 5 chronic kidney disease, or end stage renal disease: Secondary | ICD-10-CM

## 2021-03-10 DIAGNOSIS — D696 Thrombocytopenia, unspecified: Secondary | ICD-10-CM | POA: Diagnosis present

## 2021-03-10 DIAGNOSIS — D631 Anemia in chronic kidney disease: Secondary | ICD-10-CM | POA: Diagnosis present

## 2021-03-10 DIAGNOSIS — I952 Hypotension due to drugs: Secondary | ICD-10-CM | POA: Diagnosis not present

## 2021-03-10 DIAGNOSIS — Z992 Dependence on renal dialysis: Secondary | ICD-10-CM | POA: Diagnosis not present

## 2021-03-10 DIAGNOSIS — I4819 Other persistent atrial fibrillation: Secondary | ICD-10-CM | POA: Diagnosis not present

## 2021-03-10 DIAGNOSIS — R579 Shock, unspecified: Secondary | ICD-10-CM | POA: Diagnosis not present

## 2021-03-10 DIAGNOSIS — Z91199 Patient's noncompliance with other medical treatment and regimen due to unspecified reason: Secondary | ICD-10-CM

## 2021-03-10 DIAGNOSIS — J9600 Acute respiratory failure, unspecified whether with hypoxia or hypercapnia: Secondary | ICD-10-CM | POA: Diagnosis present

## 2021-03-10 DIAGNOSIS — J9601 Acute respiratory failure with hypoxia: Secondary | ICD-10-CM | POA: Diagnosis present

## 2021-03-10 DIAGNOSIS — T82898A Other specified complication of vascular prosthetic devices, implants and grafts, initial encounter: Secondary | ICD-10-CM | POA: Diagnosis not present

## 2021-03-10 DIAGNOSIS — Z7951 Long term (current) use of inhaled steroids: Secondary | ICD-10-CM

## 2021-03-10 DIAGNOSIS — Z89022 Acquired absence of left finger(s): Secondary | ICD-10-CM

## 2021-03-10 DIAGNOSIS — I5023 Acute on chronic systolic (congestive) heart failure: Secondary | ICD-10-CM | POA: Diagnosis not present

## 2021-03-10 DIAGNOSIS — E875 Hyperkalemia: Secondary | ICD-10-CM | POA: Diagnosis present

## 2021-03-10 DIAGNOSIS — I272 Pulmonary hypertension, unspecified: Secondary | ICD-10-CM | POA: Diagnosis present

## 2021-03-10 DIAGNOSIS — I110 Hypertensive heart disease with heart failure: Secondary | ICD-10-CM | POA: Diagnosis present

## 2021-03-10 DIAGNOSIS — J189 Pneumonia, unspecified organism: Secondary | ICD-10-CM

## 2021-03-10 DIAGNOSIS — Z79899 Other long term (current) drug therapy: Secondary | ICD-10-CM

## 2021-03-10 DIAGNOSIS — Z8673 Personal history of transient ischemic attack (TIA), and cerebral infarction without residual deficits: Secondary | ICD-10-CM

## 2021-03-10 DIAGNOSIS — I953 Hypotension of hemodialysis: Secondary | ICD-10-CM

## 2021-03-10 DIAGNOSIS — Z87891 Personal history of nicotine dependence: Secondary | ICD-10-CM

## 2021-03-10 DIAGNOSIS — T82511A Breakdown (mechanical) of surgically created arteriovenous shunt, initial encounter: Principal | ICD-10-CM | POA: Diagnosis present

## 2021-03-10 DIAGNOSIS — J449 Chronic obstructive pulmonary disease, unspecified: Secondary | ICD-10-CM | POA: Diagnosis present

## 2021-03-10 DIAGNOSIS — J9690 Respiratory failure, unspecified, unspecified whether with hypoxia or hypercapnia: Secondary | ICD-10-CM | POA: Diagnosis present

## 2021-03-10 DIAGNOSIS — T8110XA Postprocedural shock unspecified, initial encounter: Secondary | ICD-10-CM | POA: Diagnosis not present

## 2021-03-10 DIAGNOSIS — J9602 Acute respiratory failure with hypercapnia: Secondary | ICD-10-CM | POA: Diagnosis present

## 2021-03-10 DIAGNOSIS — R34 Anuria and oliguria: Secondary | ICD-10-CM | POA: Diagnosis present

## 2021-03-10 DIAGNOSIS — I5040 Unspecified combined systolic (congestive) and diastolic (congestive) heart failure: Secondary | ICD-10-CM | POA: Diagnosis not present

## 2021-03-10 DIAGNOSIS — Z8249 Family history of ischemic heart disease and other diseases of the circulatory system: Secondary | ICD-10-CM

## 2021-03-10 DIAGNOSIS — Z781 Physical restraint status: Secondary | ICD-10-CM

## 2021-03-10 DIAGNOSIS — Z885 Allergy status to narcotic agent status: Secondary | ICD-10-CM

## 2021-03-10 DIAGNOSIS — Z9289 Personal history of other medical treatment: Secondary | ICD-10-CM

## 2021-03-10 DIAGNOSIS — Z7982 Long term (current) use of aspirin: Secondary | ICD-10-CM

## 2021-03-10 DIAGNOSIS — I509 Heart failure, unspecified: Secondary | ICD-10-CM

## 2021-03-10 DIAGNOSIS — Z89422 Acquired absence of other left toe(s): Secondary | ICD-10-CM

## 2021-03-10 DIAGNOSIS — T8612 Kidney transplant failure: Secondary | ICD-10-CM | POA: Diagnosis present

## 2021-03-10 DIAGNOSIS — I9589 Other hypotension: Secondary | ICD-10-CM | POA: Diagnosis not present

## 2021-03-10 DIAGNOSIS — N185 Chronic kidney disease, stage 5: Secondary | ICD-10-CM | POA: Diagnosis not present

## 2021-03-10 DIAGNOSIS — Z4659 Encounter for fitting and adjustment of other gastrointestinal appliance and device: Secondary | ICD-10-CM

## 2021-03-10 DIAGNOSIS — Y832 Surgical operation with anastomosis, bypass or graft as the cause of abnormal reaction of the patient, or of later complication, without mention of misadventure at the time of the procedure: Secondary | ICD-10-CM | POA: Diagnosis present

## 2021-03-10 DIAGNOSIS — Z888 Allergy status to other drugs, medicaments and biological substances status: Secondary | ICD-10-CM

## 2021-03-10 DIAGNOSIS — I5020 Unspecified systolic (congestive) heart failure: Secondary | ICD-10-CM | POA: Diagnosis present

## 2021-03-10 HISTORY — PX: REVISON OF ARTERIOVENOUS FISTULA: SHX6074

## 2021-03-10 LAB — POCT I-STAT 7, (LYTES, BLD GAS, ICA,H+H)
Acid-Base Excess: 3 mmol/L — ABNORMAL HIGH (ref 0.0–2.0)
Acid-Base Excess: 5 mmol/L — ABNORMAL HIGH (ref 0.0–2.0)
Acid-Base Excess: 6 mmol/L — ABNORMAL HIGH (ref 0.0–2.0)
Acid-Base Excess: 7 mmol/L — ABNORMAL HIGH (ref 0.0–2.0)
Bicarbonate: 33 mmol/L — ABNORMAL HIGH (ref 20.0–28.0)
Bicarbonate: 26.5 mmol/L (ref 20.0–28.0)
Bicarbonate: 29 mmol/L — ABNORMAL HIGH (ref 20.0–28.0)
Bicarbonate: 32.6 mmol/L — ABNORMAL HIGH (ref 20.0–28.0)
Calcium, Ion: 2.14 mmol/L (ref 1.15–1.40)
Calcium, Ion: 1.15 mmol/L (ref 1.15–1.40)
Calcium, Ion: 1.2 mmol/L (ref 1.15–1.40)
Calcium, Ion: 1.18 mmol/L (ref 1.15–1.40)
HCT: 47 % (ref 39.0–52.0)
HCT: 40 % (ref 39.0–52.0)
HCT: 43 % (ref 39.0–52.0)
HCT: 43 % (ref 39.0–52.0)
Hemoglobin: 16 g/dL (ref 13.0–17.0)
Hemoglobin: 13.6 g/dL (ref 13.0–17.0)
Hemoglobin: 14.6 g/dL (ref 13.0–17.0)
Hemoglobin: 14.6 g/dL (ref 13.0–17.0)
O2 Saturation: 96 %
O2 Saturation: 100 %
O2 Saturation: 100 %
O2 Saturation: 100 %
Patient temperature: 35.8
Patient temperature: 97.9
Patient temperature: 36.6
Potassium: 5.1 mmol/L (ref 3.5–5.1)
Potassium: 3.1 mmol/L — ABNORMAL LOW (ref 3.5–5.1)
Potassium: 3.7 mmol/L (ref 3.5–5.1)
Potassium: 4.6 mmol/L (ref 3.5–5.1)
Sodium: 136 mmol/L (ref 135–145)
Sodium: 140 mmol/L (ref 135–145)
Sodium: 138 mmol/L (ref 135–145)
Sodium: 138 mmol/L (ref 135–145)
TCO2: 35 mmol/L — ABNORMAL HIGH (ref 22–32)
TCO2: 27 mmol/L (ref 22–32)
TCO2: 30 mmol/L (ref 22–32)
TCO2: 34 mmol/L — ABNORMAL HIGH (ref 22–32)
pCO2 arterial: 72.1 mmHg (ref 32–48)
pCO2 arterial: 29 mmHg — ABNORMAL LOW (ref 32–48)
pCO2 arterial: 34 mmHg (ref 32–48)
pCO2 arterial: 47.9 mmHg (ref 32–48)
pH, Arterial: 7.269 — ABNORMAL LOW (ref 7.35–7.45)
pH, Arterial: 7.565 — ABNORMAL HIGH (ref 7.35–7.45)
pH, Arterial: 7.537 — ABNORMAL HIGH (ref 7.35–7.45)
pH, Arterial: 7.44 (ref 7.35–7.45)
pO2, Arterial: 99 mmHg (ref 83–108)
pO2, Arterial: 269 mmHg — ABNORMAL HIGH (ref 83–108)
pO2, Arterial: 314 mmHg — ABNORMAL HIGH (ref 83–108)
pO2, Arterial: 199 mmHg — ABNORMAL HIGH (ref 83–108)

## 2021-03-10 LAB — CBC
HCT: 40.5 % (ref 39.0–52.0)
Hemoglobin: 13.1 g/dL (ref 13.0–17.0)
MCH: 29.8 pg (ref 26.0–34.0)
MCHC: 32.3 g/dL (ref 30.0–36.0)
MCV: 92 fL (ref 80.0–100.0)
Platelets: 69 10*3/uL — ABNORMAL LOW (ref 150–400)
RBC: 4.4 MIL/uL (ref 4.22–5.81)
RDW: 15.9 % — ABNORMAL HIGH (ref 11.5–15.5)
WBC: 4.4 10*3/uL (ref 4.0–10.5)
nRBC: 0 % (ref 0.0–0.2)

## 2021-03-10 LAB — POCT I-STAT, CHEM 8
BUN: 31 mg/dL — ABNORMAL HIGH (ref 6–20)
Calcium, Ion: 1.11 mmol/L — ABNORMAL LOW (ref 1.15–1.40)
Chloride: 96 mmol/L — ABNORMAL LOW (ref 98–111)
Creatinine, Ser: 7.8 mg/dL — ABNORMAL HIGH (ref 0.61–1.24)
Glucose, Bld: 88 mg/dL (ref 70–99)
HCT: 49 % (ref 39.0–52.0)
Hemoglobin: 16.7 g/dL (ref 13.0–17.0)
Potassium: 3.8 mmol/L (ref 3.5–5.1)
Sodium: 139 mmol/L (ref 135–145)
TCO2: 34 mmol/L — ABNORMAL HIGH (ref 22–32)

## 2021-03-10 LAB — TROPONIN I (HIGH SENSITIVITY)
Troponin I (High Sensitivity): 76 ng/L — ABNORMAL HIGH (ref <18)
Troponin I (High Sensitivity): 98 ng/L — ABNORMAL HIGH (ref <18)

## 2021-03-10 LAB — BASIC METABOLIC PANEL
Anion gap: 13 (ref 5–15)
BUN: 30 mg/dL — ABNORMAL HIGH (ref 6–20)
CO2: 28 mmol/L (ref 22–32)
Calcium: 10.1 mg/dL (ref 8.9–10.3)
Chloride: 98 mmol/L (ref 98–111)
Creatinine, Ser: 7.61 mg/dL — ABNORMAL HIGH (ref 0.61–1.24)
GFR, Estimated: 8 mL/min — ABNORMAL LOW (ref >60)
Glucose, Bld: 88 mg/dL (ref 70–99)
Potassium: 3.6 mmol/L (ref 3.5–5.1)
Sodium: 139 mmol/L (ref 135–145)

## 2021-03-10 LAB — GLUCOSE, CAPILLARY
Glucose-Capillary: 71 mg/dL (ref 70–99)
Glucose-Capillary: 75 mg/dL (ref 70–99)
Glucose-Capillary: 75 mg/dL (ref 70–99)
Glucose-Capillary: 77 mg/dL (ref 70–99)
Glucose-Capillary: 88 mg/dL (ref 70–99)

## 2021-03-10 LAB — HEMOGLOBIN A1C
Hgb A1c MFr Bld: 5.4 % (ref 4.8–5.6)
Mean Plasma Glucose: 108.28 mg/dL

## 2021-03-10 LAB — MAGNESIUM: Magnesium: 2.2 mg/dL (ref 1.7–2.4)

## 2021-03-10 SURGERY — REVISON OF ARTERIOVENOUS FISTULA
Anesthesia: General | Site: Arm Upper | Laterality: Left

## 2021-03-10 MED ORDER — FENTANYL CITRATE PF 50 MCG/ML IJ SOSY
50.0000 ug | PREFILLED_SYRINGE | INTRAMUSCULAR | Status: AC | PRN
  Administered 2021-03-10 – 2021-03-11 (×3): 50 ug via INTRAVENOUS
  Filled 2021-03-10 (×3): qty 1

## 2021-03-10 MED ORDER — ONDANSETRON HCL 4 MG/2ML IJ SOLN
INTRAMUSCULAR | Status: DC | PRN
  Administered 2021-03-10: 4 mg via INTRAVENOUS

## 2021-03-10 MED ORDER — PROPOFOL 1000 MG/100ML IV EMUL
0.0000 ug/kg/min | INTRAVENOUS | Status: DC
  Administered 2021-03-10: 40 ug/kg/min via INTRAVENOUS
  Administered 2021-03-11: 30 ug/kg/min via INTRAVENOUS
  Administered 2021-03-11: 25 ug/kg/min via INTRAVENOUS
  Filled 2021-03-10 (×3): qty 100

## 2021-03-10 MED ORDER — PAPAVERINE HCL 30 MG/ML IJ SOLN
INTRAMUSCULAR | Status: AC
  Filled 2021-03-10: qty 2

## 2021-03-10 MED ORDER — ORAL CARE MOUTH RINSE: 15.0000 mL | Freq: Once | OROMUCOSAL | Status: AC

## 2021-03-10 MED ORDER — POTASSIUM CHLORIDE 20 MEQ PO PACK
40.0000 meq | PACK | Freq: Once | ORAL | Status: AC
Start: 2021-03-10 — End: 2021-03-10
  Administered 2021-03-10: 40 meq
  Filled 2021-03-10: qty 2

## 2021-03-10 MED ORDER — HEPARIN 6000 UNIT IRRIGATION SOLUTION
Status: AC
  Filled 2021-03-10: qty 500

## 2021-03-10 MED ORDER — FENTANYL CITRATE (PF) 250 MCG/5ML IJ SOLN
INTRAMUSCULAR | Status: AC
  Filled 2021-03-10: qty 5

## 2021-03-10 MED ORDER — CHLORHEXIDINE GLUCONATE 4 % EX LIQD: 60.0000 mL | Freq: Once | CUTANEOUS | Status: DC

## 2021-03-10 MED ORDER — PROPOFOL 1000 MG/100ML IV EMUL
INTRAVENOUS | Status: AC
  Administered 2021-03-10: 30 ug/kg/min via INTRAVENOUS
  Filled 2021-03-10: qty 100

## 2021-03-10 MED ORDER — INSULIN ASPART 100 UNIT/ML IJ SOLN: 0.0000 [IU] | INTRAMUSCULAR | Status: DC | PRN

## 2021-03-10 MED ORDER — PANTOPRAZOLE SODIUM 40 MG IV SOLR
40.0000 mg | Freq: Every day | INTRAVENOUS | Status: DC
  Administered 2021-03-10 – 2021-03-11 (×2): 40 mg via INTRAVENOUS
  Filled 2021-03-10 (×3): qty 10

## 2021-03-10 MED ORDER — INSULIN ASPART 100 UNIT/ML IJ SOLN
0.0000 [IU] | INTRAMUSCULAR | Status: DC
  Administered 2021-03-11: 1 [IU] via SUBCUTANEOUS

## 2021-03-10 MED ORDER — SODIUM CHLORIDE 0.9 % IV SOLN: INTRAVENOUS | Status: DC

## 2021-03-10 MED ORDER — CEFAZOLIN SODIUM-DEXTROSE 2-4 GM/100ML-% IV SOLN
2.0000 g | INTRAVENOUS | Status: AC
  Administered 2021-03-10: 2 g via INTRAVENOUS
  Filled 2021-03-10: qty 100

## 2021-03-10 MED ORDER — DOCUSATE SODIUM 50 MG/5ML PO LIQD
100.0000 mg | Freq: Two times a day (BID) | ORAL | Status: DC
  Administered 2021-03-10 – 2021-03-12 (×3): 100 mg
  Filled 2021-03-10 (×3): qty 10

## 2021-03-10 MED ORDER — PROPOFOL 10 MG/ML IV BOLUS
INTRAVENOUS | Status: DC | PRN
  Administered 2021-03-10: 30 mg via INTRAVENOUS

## 2021-03-10 MED ORDER — CHLORHEXIDINE GLUCONATE 0.12% ORAL RINSE (MEDLINE KIT)
15.0000 mL | Freq: Two times a day (BID) | OROMUCOSAL | Status: DC
  Administered 2021-03-10 – 2021-03-12 (×4): 15 mL via OROMUCOSAL

## 2021-03-10 MED ORDER — CHLORHEXIDINE GLUCONATE CLOTH 2 % EX PADS
6.0000 | MEDICATED_PAD | Freq: Every day | CUTANEOUS | Status: DC
  Administered 2021-03-13 – 2021-03-15 (×3): 6 via TOPICAL

## 2021-03-10 MED ORDER — LACTATED RINGERS IV SOLN: INTRAVENOUS | Status: DC

## 2021-03-10 MED ORDER — VASOPRESSIN 20 UNIT/ML IV SOLN
INTRAVENOUS | Status: AC
  Filled 2021-03-10: qty 1

## 2021-03-10 MED ORDER — POLYETHYLENE GLYCOL 3350 17 G PO PACK
17.0000 g | PACK | Freq: Every day | ORAL | Status: DC
  Administered 2021-03-10 – 2021-03-11 (×2): 17 g
  Filled 2021-03-10 (×2): qty 1

## 2021-03-10 MED ORDER — SODIUM CHLORIDE 0.9 % IV SOLN: INTRAVENOUS | Status: DC | PRN

## 2021-03-10 MED ORDER — CHLORHEXIDINE GLUCONATE 0.12 % MT SOLN
15.0000 mL | Freq: Once | OROMUCOSAL | Status: AC
  Administered 2021-03-10: 15 mL via OROMUCOSAL
  Filled 2021-03-10: qty 15

## 2021-03-10 MED ORDER — PHENYLEPHRINE HCL-NACL 20-0.9 MG/250ML-% IV SOLN
INTRAVENOUS | Status: DC | PRN
Start: 2021-03-10 — End: 2021-03-10
  Administered 2021-03-10: 50 ug/min via INTRAVENOUS
  Administered 2021-03-10: 100 ug/min via INTRAVENOUS
  Administered 2021-03-10: 75 ug/min via INTRAVENOUS

## 2021-03-10 MED ORDER — PHENYLEPHRINE 40 MCG/ML (10ML) SYRINGE FOR IV PUSH (FOR BLOOD PRESSURE SUPPORT)
PREFILLED_SYRINGE | INTRAVENOUS | Status: DC | PRN
  Administered 2021-03-10 (×2): 160 ug via INTRAVENOUS

## 2021-03-10 MED ORDER — ONDANSETRON HCL 4 MG/2ML IJ SOLN
INTRAMUSCULAR | Status: AC
  Filled 2021-03-10: qty 2

## 2021-03-10 MED ORDER — ORAL CARE MOUTH RINSE
15.0000 mL | OROMUCOSAL | Status: DC
  Administered 2021-03-10 – 2021-03-11 (×10): 15 mL via OROMUCOSAL

## 2021-03-10 MED ORDER — CALCIUM CHLORIDE 10 % IV SOLN
INTRAVENOUS | Status: DC | PRN
  Administered 2021-03-10 (×2): 200 mg via INTRAVENOUS
  Administered 2021-03-10: 100 mg via INTRAVENOUS
  Administered 2021-03-10: 500 mg via INTRAVENOUS

## 2021-03-10 MED ORDER — FENTANYL CITRATE PF 50 MCG/ML IJ SOSY: 50.0000 ug | PREFILLED_SYRINGE | INTRAMUSCULAR | Status: DC | PRN

## 2021-03-10 MED ORDER — ROCURONIUM BROMIDE 100 MG/10ML IV SOLN
INTRAVENOUS | Status: DC | PRN
  Administered 2021-03-10: 40 mg via INTRAVENOUS
  Administered 2021-03-10: 60 mg via INTRAVENOUS

## 2021-03-10 MED ORDER — PHENYLEPHRINE HCL-NACL 20-0.9 MG/250ML-% IV SOLN: 0.0000 ug/min | INTRAVENOUS | Status: DC

## 2021-03-10 MED ORDER — SODIUM CHLORIDE 0.9 % IV SOLN: 250.0000 mL | INTRAVENOUS | Status: DC

## 2021-03-10 MED ORDER — PROPOFOL 500 MG/50ML IV EMUL
INTRAVENOUS | Status: DC | PRN
  Administered 2021-03-10: 50 ug/kg/min via INTRAVENOUS

## 2021-03-10 MED ORDER — FENTANYL CITRATE (PF) 250 MCG/5ML IJ SOLN
INTRAMUSCULAR | Status: DC | PRN
  Administered 2021-03-10: 20 ug via INTRAVENOUS

## 2021-03-10 MED ORDER — LIDOCAINE-EPINEPHRINE (PF) 1 %-1:200000 IJ SOLN
INTRAMUSCULAR | Status: AC
  Filled 2021-03-10: qty 30

## 2021-03-10 MED ORDER — LIDOCAINE-EPINEPHRINE (PF) 1 %-1:200000 IJ SOLN
INTRAMUSCULAR | Status: DC | PRN
  Administered 2021-03-10: 6.5 mL

## 2021-03-10 MED ORDER — 0.9 % SODIUM CHLORIDE (POUR BTL) OPTIME
TOPICAL | Status: DC | PRN
  Administered 2021-03-10: 1000 mL

## 2021-03-10 MED ORDER — CALCIUM CHLORIDE 10 % IV SOLN
INTRAVENOUS | Status: AC
  Filled 2021-03-10: qty 10

## 2021-03-10 MED ORDER — PHENYLEPHRINE HCL-NACL 20-0.9 MG/250ML-% IV SOLN
25.0000 ug/min | INTRAVENOUS | Status: DC
  Administered 2021-03-10: 50 ug/min via INTRAVENOUS
  Administered 2021-03-10: 55 ug/min via INTRAVENOUS
  Administered 2021-03-11: 70 ug/min via INTRAVENOUS
  Administered 2021-03-11: 60 ug/min via INTRAVENOUS
  Administered 2021-03-11 (×2): 65 ug/min via INTRAVENOUS
  Administered 2021-03-12: 80 ug/min via INTRAVENOUS
  Administered 2021-03-12: 100 ug/min via INTRAVENOUS
  Administered 2021-03-12 (×3): 60 ug/min via INTRAVENOUS
  Administered 2021-03-13: 70 ug/min via INTRAVENOUS
  Administered 2021-03-13: 90 ug/min via INTRAVENOUS
  Filled 2021-03-10 (×14): qty 250

## 2021-03-10 MED ORDER — HEPARIN 6000 UNIT IRRIGATION SOLUTION
Status: DC | PRN
  Administered 2021-03-10: 1

## 2021-03-10 MED ORDER — VASOPRESSIN 20 UNIT/ML IV SOLN
INTRAVENOUS | Status: DC | PRN
  Administered 2021-03-10 (×2): 2 [IU] via INTRAVENOUS

## 2021-03-10 SURGICAL SUPPLY — 42 items
ARMBAND PINK RESTRICT EXTREMIT (MISCELLANEOUS) ×3 IMPLANT
BAG COUNTER SPONGE SURGICOUNT (BAG) ×2 IMPLANT
BAG SURGICOUNT SPONGE COUNTING (BAG) ×1
BNDG ESMARK 4X9 LF (GAUZE/BANDAGES/DRESSINGS) ×2 IMPLANT
CANISTER SUCT 3000ML PPV (MISCELLANEOUS) ×3 IMPLANT
CLIP LIGATING EXTRA MED SLVR (CLIP) ×3 IMPLANT
CLIP LIGATING EXTRA SM BLUE (MISCELLANEOUS) ×3 IMPLANT
COVER PROBE W GEL 5X96 (DRAPES) ×3 IMPLANT
DERMABOND ADVANCED (GAUZE/BANDAGES/DRESSINGS) ×2
DERMABOND ADVANCED .7 DNX12 (GAUZE/BANDAGES/DRESSINGS) ×1 IMPLANT
DRSG COVADERM 4X8 (GAUZE/BANDAGES/DRESSINGS) ×4 IMPLANT
ELECT REM PT RETURN 9FT ADLT (ELECTROSURGICAL) ×3 IMPLANT
ELECTRODE REM PT RTRN 9FT ADLT (ELECTROSURGICAL) ×1 IMPLANT
GAUZE 4X4 16PLY ~~LOC~~+RFID DBL (SPONGE) ×2 IMPLANT
GLOVE SURG ENC MOIS LTX SZ7.5 (GLOVE) ×3 IMPLANT
GLOVE SURG UNDER LTX SZ8 (GLOVE) ×2 IMPLANT
GLOVE SURG UNDER POLY LF SZ6.5 (GLOVE) ×2 IMPLANT
GOWN STRL REUS W/ TWL LRG LVL3 (GOWN DISPOSABLE) ×2 IMPLANT
GOWN STRL REUS W/ TWL XL LVL3 (GOWN DISPOSABLE) ×1 IMPLANT
GOWN STRL REUS W/TWL LRG LVL3 (GOWN DISPOSABLE) ×6
GOWN STRL REUS W/TWL XL LVL3 (GOWN DISPOSABLE) ×3
KIT BASIN OR (CUSTOM PROCEDURE TRAY) ×3 IMPLANT
KIT TOURNIQUET VASCULAR (KITS) ×2 IMPLANT
KIT TURNOVER KIT B (KITS) ×3 IMPLANT
NS IRRIG 1000ML POUR BTL (IV SOLUTION) ×3 IMPLANT
PACK CV ACCESS (CUSTOM PROCEDURE TRAY) ×3 IMPLANT
PAD ARMBOARD 7.5X6 YLW CONV (MISCELLANEOUS) ×6 IMPLANT
SLING ARM FOAM STRAP LRG (SOFTGOODS) IMPLANT
SLING ARM FOAM STRAP MED (SOFTGOODS) IMPLANT
SPONGE T-LAP 18X18 ~~LOC~~+RFID (SPONGE) ×2 IMPLANT
STAPLER VISISTAT 35W (STAPLE) ×2 IMPLANT
STOCKINETTE IMPERVIOUS 9X36 MD (GAUZE/BANDAGES/DRESSINGS) ×2 IMPLANT
SUT MNCRL AB 4-0 PS2 18 (SUTURE) ×3 IMPLANT
SUT PROLENE 4 0 SH DA (SUTURE) ×2 IMPLANT
SUT PROLENE 5 0 C 1 24 (SUTURE) ×4 IMPLANT
SUT PROLENE 6 0 BV (SUTURE) ×5 IMPLANT
SUT VIC AB 3-0 SH 27 (SUTURE) ×3
SUT VIC AB 3-0 SH 27X BRD (SUTURE) ×1 IMPLANT
SYR BULB IRRIG 60ML STRL (SYRINGE) ×2 IMPLANT
TOWEL GREEN STERILE (TOWEL DISPOSABLE) ×3 IMPLANT
UNDERPAD 30X36 HEAVY ABSORB (UNDERPADS AND DIAPERS) ×3 IMPLANT
WATER STERILE IRR 1000ML POUR (IV SOLUTION) ×3 IMPLANT

## 2021-03-10 NOTE — Progress Notes (Addendum)
eLink Physician-Brief Progress Note ?Patient Name: Nathaniel White ?DOB: 1960-06-17 ?MRN: [id number] ? ? ?Date of Service ? 03/10/2021  ?HPI/Events of Note ? Called re: slow Afib HR 66 ?                   NGT placement  ?                   Need for soft restraints (right wrist)   ?eICU Interventions ? Suspect Afib is due to alkalosis post intubation- pH 7.53. Requested RT to cut back RR to 12, TV to 500, recheck ABG in 1hr  ? ?Phenylephrine may be contributing, change to Levophed if low HR persists  ? ?Replete K+ to goal 4.0  ? ?Check Mg stat ? ?NGT in distal esophagus, can be used after replacement- push down 5-10cm  ? ?Wrist restraint order placed   ? ? ? ?  ? ?Hewitt Shorts Jerome Viglione ?03/10/2021, 7:44 PM ?

## 2021-03-10 NOTE — Consult Note (Addendum)
NAME:  Nathaniel White, MRN:  [id number], DOB:  16-Jul-1960, LOS: 0 ADMISSION DATE:  03/10/2021, CONSULTATION DATE:  3/3 REFERRING MD:  Donzetta Matters, CHIEF COMPLAINT:  vent weaning and critical care support   History of Present Illness:  61 year old male w/ sig PMH as outlined below most significantly ESRD and Left arm AVF which has required multiple plications in past. Presented to the OR for planned revision of fistula due to recent area of denuded skin and oozing blood after scab removed.  Was brought to OR. Was getting Propofol for sedation. Shortly after administration BP rapidly declined w/ SBP initially in 150s to < 100 and pt was no longer spontaneously breathing. Received: calcium, vasopressin and neo  stick. Intubated. Maintained on neo gtt and transferred to ICU   Pertinent  Medical History  ESRD (MWF), asthma, CHF, difficulty waking up from anesthesia in past, COPD, type II DM, esophageal varices, prior stroke w/ left sided deficits, HTN, HFrEF (40-45%), MR, memory loss, prior stroke, MR, tobacco abuse   Significant Hospital Events: Including procedures, antibiotic start and stop dates in addition to other pertinent events   3/3 admitted to ICU from [place] after precipitous hypotension following propofol sedation complicated further by respiratory failure requiring intubation.  Revision was completed of AVG  Interim History / Subjective:  Sedated, just received neuromuscular blockade prior to transport to ICU  Objective   Blood pressure (Abnormal) 93/57, pulse (Abnormal) 101, temperature 98.7 F (37.1 C), resp. rate 17, height '5\' 9"'  (1.753 m), weight 81.6 kg, SpO2 94 %.       No intake or output data in the 24 hours ending 03/10/21 1526 Filed Weights   03/09/21 1418 03/10/21 1038  Weight: 79.4 kg 81.6 kg    Examination: General: 61 year old male patient currently sedated on ventilatory support  HENT: Orally intubated Lungs: Coarse remaining on full vent support Pcxr w/ chronic right  sided scaring and new left basilar air space disease.  Cardiovascular: Audible systolic murmur Abdomen: Soft nontender Extremities: Warm dry palpable pulses left AV fistula with good bruit and thrill Neuro: Currently sedated GU: Anuric  Resolved Hospital Problem list     Assessment & Plan:  Principal Problem:   Shock circulatory ([place]) Active Problems:   ESRD (end stage renal disease) on dialysis ([place])   H/O   Anemia   History of nonadherence to medical treatment   History of CVA (cerebrovascular accident)   CHF, systolic dysfunction   Hemodialysis AV fistula aneurysm (HCC)   Respiratory failure (HCC)   Drug-induced hypotension    Acute hypercarbic respiratory failure->now w/ iatrogenic resp alk  -CRNA reported minimal respiratory effort and rising end-tidal CO2 requiring intubation for airway protection -at risk for aspiration  Plan Full vent support Decrease Ve PAD protocol RASS goal 0-1 wean prop  VAP bundle Chest x-ray in am, monitoring for fever, wbc spike or sputum change raising concern for aspiration  Spontaneous breathing trial soon as mental status will allow  Circulatory shock.  Drug-induced from sedation, superimposed on chronic hypotension (on midodrine on dialysis days) Plan Weaning propofol (holding off on stopping as got Rocuronium prior to transport to ICU) RASS goal 0 Peripheral phenylephrine Cycle CEs Consider ECHO if shock state persists  HFrEF (EF 40/45%) Plan Continuing telemetry monitoring Not a candidate for goal-directed medical therapy  Status post AV fistula revision Plan Postoperative wound care per vascular  End-stage renal disease (Monday/Wednesday/Friday) -Not clear when his last dialysis course was.  Currently no immediate need for  dialysis Plan We will notify nephrology  History of prior CVA with left-sided hemiparesis Plan Resume aspirin when okay with vascular  Type 2 diabetes Plan Sensitive scale sliding scale  insulin  Best Practice (right click and "Reselect all SmartList Selections" daily)   Diet/type: NPO DVT prophylaxis: SCD GI prophylaxis: PPI Lines: N/A Foley:  N/A Code Status:  full code Last date of multidisciplinary goals of care discussion [pending ]  Labs   CBC: Recent Labs  Lab 03/10/21 1106 03/10/21 1417  HGB 16.7 16.0  HCT 49.0 12.7    Basic Metabolic Panel: Recent Labs  Lab 03/10/21 1106 03/10/21 1417  NA 139 136  K 3.8 5.1  CL 96*  --   GLUCOSE 88  --   BUN 31*  --   CREATININE 7.80*  --    GFR: Estimated Creatinine Clearance: 10.3 mL/min (A) (by C-G formula based on SCr of 7.8 mg/dL (H)). No results for input(s): PROCALCITON, WBC, LATICACIDVEN in the last 168 hours.  Liver Function Tests: No results for input(s): AST, ALT, ALKPHOS, BILITOT, PROT, ALBUMIN in the last 168 hours. No results for input(s): LIPASE, AMYLASE in the last 168 hours. No results for input(s): AMMONIA in the last 168 hours.  ABG    Component Value Date/Time   PHART 7.269 (L) 03/10/2021 1417   PCO2ART 72.1 (HH) 03/10/2021 1417   PO2ART 99 03/10/2021 1417   HCO3 33.0 (H) 03/10/2021 1417   TCO2 35 (H) 03/10/2021 1417   O2SAT 96 03/10/2021 1417     Coagulation Profile: No results for input(s): INR, PROTIME in the last 168 hours.  Cardiac Enzymes: No results for input(s): CKTOTAL, CKMB, CKMBINDEX, TROPONINI in the last 168 hours.  HbA1C: Hgb A1c MFr Bld  Date/Time Value Ref Range Status  08/13/2011 01:00 PM 5.4 <5.7 % Final    Comment:    (NOTE)                                                                       According to the ADA Clinical Practice Recommendations for 2011, when HbA1c is used as a screening test:  >=6.5%   Diagnostic of Diabetes Mellitus           (if abnormal result is confirmed) 5.7-6.4%   Increased risk of developing Diabetes Mellitus References:Diagnosis and Classification of Diabetes Mellitus,Diabetes NTZG,0174,94(WHQPR 1):S62-S69 and  Standards of Medical Care in         Diabetes - 2011,Diabetes FFMB,8466,59 (Suppl 1):S11-S61.    CBG: Recent Labs  Lab 03/10/21 1039 03/10/21 1300  GLUCAP 75 75    Review of Systems:   Not able   Past Medical History:  He,  has a past medical history of Anemia, Anxiety, Asthma, CHF (congestive heart failure) ([place]), Complication from renal dialysis device (93/57/0177), Complication of anesthesia (2017), COPD (chronic obstructive pulmonary disease) ([place]), Diabetes mellitus without complication ([place]), ESOPHAGEAL VARICES (10/04/2008), ESRD, Hemiparesis due to old stroke ([place]), Hypertension, LV dysfunction, Memory loss due to medical condition, MR (mitral regurgitation), Peripheral vascular disease ([place]), Shortness of breath, Stroke ([place]), and Tobacco abuse.   Surgical History:   Past Surgical History:  Procedure Laterality Date   ABDOMINAL AORTOGRAM W/LOWER EXTREMITY Bilateral 07/21/2018   Procedure: ABDOMINAL AORTOGRAM W/LOWER EXTREMITY;  Surgeon: Waynetta Sandy, MD;  Location: [facility];  Service: Cardiovascular;  Laterality: Bilateral;   AMPUTATION Left 11/05/2018   Procedure: AMPUTATION SECOND TOE LEFT FOOT;  Surgeon: Waynetta Sandy, MD;  Location: [facility];  Service: Vascular;  Laterality: Left;   AMPUTATION Left 02/13/2021   Procedure: Left small finger AMPUTATION DIGIT;  Surgeon: Sherilyn Cooter, MD;  Location: [facility];  Service: Orthopedics;  Laterality: Left;   AV FISTULA PLACEMENT     CARDIAC CATHETERIZATION     [facility]   COLONOSCOPY W/ BIOPSIES AND POLYPECTOMY     FISTULA SUPERFICIALIZATION Left 11/10/2013   Procedure: FISTULA PLICATION;  Surgeon: Serafina Mitchell, MD;  Location: [facility] OR;  Service: Vascular;  Laterality: Left;   KIDNEY TRANSPLANT     2011 rejected kidney 2012 back on dialysis   LEFT AND RIGHT HEART CATHETERIZATION WITH CORONARY ANGIOGRAM N/A 10/09/2013   Procedure: LEFT AND RIGHT HEART CATHETERIZATION WITH CORONARY ANGIOGRAM;   Surgeon: Burnell Blanks, MD;  Location: [facility] CATH LAB;  Service: Cardiovascular;  Laterality: N/A;   PERIPHERAL VASCULAR INTERVENTION Left 07/21/2018   Procedure: PERIPHERAL VASCULAR INTERVENTION;  Surgeon: Waynetta Sandy, MD;  Location: [facility];  Service: Cardiovascular;  Laterality: Left;  Popliteal   REVISON OF ARTERIOVENOUS FISTULA Left 08/26/2013   Procedure: EXCISION OF ERODED SKIN AND EXPLORATION OF MAIN LEFT UPPER ARM AV FISTULA;  Surgeon: Rosetta Posner, MD;  Location: [place];  Service: Vascular;  Laterality: Left;   REVISON OF ARTERIOVENOUS FISTULA Left 02/05/2014   Procedure: REPAIR OF ARTERIOVENOUS FISTULA ANEURYSM;  Surgeon: Serafina Mitchell, MD;  Location: [place];  Service: Vascular;  Laterality: Left;   SHUNTOGRAM N/A 11/05/2012   Procedure: Fistulogram;  Surgeon: Serafina Mitchell, MD;  Location: [facility] CATH LAB;  Service: Cardiovascular;  Laterality: N/A;   TEE WITHOUT CARDIOVERSION  08/17/2011   Procedure: TRANSESOPHAGEAL ECHOCARDIOGRAM (TEE);  Surgeon: Larey Dresser, MD;  Location: [facility];  Service: Cardiovascular;  Laterality: N/A;   [place] (VATS)/DECORTICATION  08/10/11     Social History:   reports that he quit smoking about 9 years ago. His smoking use included cigarettes. He has a 7.50 pack-year smoking history. He has never used smokeless tobacco. He reports that he does not drink alcohol and does not use drugs.   Family History:  His family history includes CAD in his paternal uncle; Cancer in his paternal grandfather; Diabetes in his paternal grandmother; Hypertension in his mother; Varicose Veins in his mother.   Allergies Allergies  Allergen Reactions   Codeine Shortness Of Breath   Other Other (See Comments) and Shortness Of Breath   Dextromethorphan-Guaifenesin Other (See Comments)   Iron Dextran Other (See Comments)   Morphine And Related Other (See Comments)     Home Medications  Prior to Admission medications    Medication Sig Start Date End Date Taking? Authorizing Provider  acetaminophen (TYLENOL) 500 MG tablet Take 1 tablet (500 mg total) by mouth every 6 (six) hours as needed. 04/11/18  Yes Burky, Malachy Moan, NP  albuterol (PROAIR HFA) 108 (90 Base) MCG/ACT inhaler Inhale 2 puffs into the lungs every 6 (six) hours as needed for wheezing or shortness of breath. 01/11/21  Yes Dorna Mai, MD  aspirin EC 81 MG tablet Take 81 mg by mouth daily.   Yes [provider]  bacitracin 500 UNIT/GM ointment Apply 1 application topically 2 (two) times daily. 12/29/20  Yes Fenton Foy, NP  cephALEXin [facility])  500 MG capsule Take 1 capsule (500 mg total) by mouth 3 (three) times daily. 01/25/21  Yes McClung, Dionne Bucy, PA-C  oxyCODONE (ROXICODONE) 5 MG immediate release tablet Take 1 tablet (5 mg total) by mouth every 4 (four) hours as needed for severe pain. 02/13/21  Yes Sherilyn Cooter, MD  fluticasone-salmeterol (ADVAIR DISKUS) 250-50 MCG/ACT AEPB Inhale 1 puff into the lungs in the morning and at bedtime. 07/27/20 10/25/20  Gildardo Pounds, NP  glucose blood test strip  12/26/09   [provider]  midodrine (PROAMATINE) 10 MG tablet Take 10 mg by mouth 3 (three) times a week. 01/17/21   [provider]  montelukast (SINGULAIR) 10 MG tablet Take 1 tablet (10 mg total) by mouth at bedtime. 07/27/20 10/25/20  Gildardo Pounds, NP  VELPHORO 500 MG chewable tablet Chew 1,000 mg by mouth 3 (three) times daily. 02/06/21   [provider]     Critical care time: 32 min     Nathaniel White ACNP-BC [place] Pager # [phone] OR # [phone] if no answer

## 2021-03-10 NOTE — Anesthesia Procedure Notes (Signed)
Arterial Line Insertion ?Start/End3/03/2021 2:13 PM, 03/10/2021 2:15 PM ?Performed by: Audry Pili, MD, anesthesiologist ? Patient location: OR. ?Emergency situation ?Patient sedated ?Right, radial was placed ?Catheter size: 20 G ?Hand hygiene performed  ? ?Attempts: 1 ?Procedure performed using ultrasound guided (No ultrasound image printed due to emergent nature of procedure) technique. ?Ultrasound Notes:anatomy identified, needle tip was noted to be adjacent to the nerve/plexus identified and no ultrasound evidence of intravascular and/or intraneural injection ?Following insertion, dressing applied and Biopatch. ?Post procedure assessment: unchanged and normal ? ?Patient tolerated the procedure well with no immediate complications. ? ? ?

## 2021-03-10 NOTE — Discharge Instructions (Signed)
? ?[facility] ? ?Discharge Instructions ? ?AV Fistula or Graft Surgery for Dialysis Access ? ?Please refer to the following instructions for your post-procedure care. Your surgeon or physician assistant will discuss any changes with you. ? ?Activity ? ?You may drive the day following your surgery, if you are comfortable and no longer taking prescription pain medication. Resume full activity as the soreness in your incision resolves. ? ?Bathing/Showering ? ?You may shower after you go home. Keep your incision dry for 48 hours. Do not soak in a bathtub, hot tub, or swim until the incision heals completely. You may not shower if you have a hemodialysis catheter. ? ?Incision Care ? ?Clean your incision with mild soap and water after 48 hours. Pat the area dry with a clean towel. You do not need a bandage unless otherwise instructed. Do not apply any ointments or creams to your incision. You may have skin glue on your incision. Do not peel it off. It will come off on its own in about one week. Your arm may swell a bit after surgery. To reduce swelling use pillows to elevate your arm so it is above your heart. Your doctor will tell you if you need to lightly wrap your arm with an ACE bandage. ? ?Diet ? ?Resume your normal diet. There are not special food restrictions following this procedure. In order to heal from your surgery, it is CRITICAL to get adequate nutrition. Your body requires vitamins, minerals, and protein. Vegetables are the best source of vitamins and minerals. Vegetables also provide the perfect balance of protein. Processed food has little nutritional value, so try to avoid this. ? ?Medications ? ?Resume taking all of your medications. If your incision is causing pain, you may take over-the counter pain relievers such as acetaminophen (Tylenol). If you were prescribed a stronger pain medication, please be aware these medications can cause nausea and constipation. Prevent  nausea by taking the medication with a snack or meal. Avoid constipation by drinking plenty of fluids and eating foods with high amount of fiber, such as fruits, vegetables, and grains.  ?Do not take Tylenol if you are taking prescription pain medications. ? ?Follow up ?Your surgeon may want to see you in the office following your access surgery. If so, this will be arranged at the time of your surgery. ? ?Please call us immediately for any of the following conditions: ? ?Increased pain, redness, drainage (pus) from your incision site ?Fever of 101 degrees or higher ?Severe or worsening pain at your incision site ?Hand pain or numbness. ? ?Reduce your risk of vascular disease: ? ?Stop smoking. If you would like help, call [facility] at 1-800-QUIT-NOW (817)859-5100) or [place] at [phone] ? ?Manage your cholesterol ?Maintain a desired weight ?Control your diabetes ?Keep your blood pressure down ? ?Dialysis ? ?It will take several weeks to several months for your new dialysis access to be ready for use. Your surgeon will determine when it is okay to use it. Your nephrologist will continue to direct your dialysis. You can continue to use your Permcath until your new access is ready for use. ? ? ?03/10/2021 ?Nathaniel White ?[id number] ?August 10, 1960 ? ?Surgeon(s): ?Waynetta Sandy, MD ? ?Procedure(s): ?LEFT ARM REVISION OF ARTERIOVENOUS FISTULA WITH PLICATION ? ? ?x May stick graft on designated area only:  ?Do NOT stick over incision for 8 weeks. ?May stick above and below incision now. ?SEE DIAGRAM.  ? ?If you have any questions, please call  the office at [phone]. ? ?

## 2021-03-10 NOTE — Anesthesia Procedure Notes (Deleted)
Arterial Line Insertion ?Start/End3/03/2021 2:11 PM ?Performed by: Audry Pili, MD, anesthesiologist ? Patient location: OR. ?Emergency situation ?Right, radial was placed ?Catheter size: 20 G ?Hand hygiene performed  and maximum sterile barriers used  ? ?Attempts: 1 ?Procedure performed using ultrasound guided technique. ?Ultrasound Notes:anatomy identified, needle tip was noted to be adjacent to the nerve/plexus identified and no ultrasound evidence of intravascular and/or intraneural injection ?Following insertion, Biopatch and dressing applied. ?Post procedure assessment: unchanged ? ? ? ?

## 2021-03-10 NOTE — Op Note (Signed)
? ? ?  Patient name: Nathaniel White MRN: [id number] DOB: 04-03-1960 Sex: male ? ?03/10/2021 ?Pre-operative Diagnosis: esrd ?Post-operative diagnosis:  Same ?Surgeon:  Eda Paschal. Donzetta Matters, MD ?Assistants: Marjean Donna, MD; Leontine Locket, PA ?Procedure Performed:  Revision of left arm AV fistula with plication ? ?Indications: 61 year old male with history of end-stage renal disease.  He has a left arm AV fistula that has an area of skin necrosis overlying it.  He has not had any bleeding issues but there is concern for rupture.  He is now indicated for revision. ? ?Experience assistants were necessary to hold pressure to assist with exposure including providing counter tension and suction.  During the case patient did have loss of blood pressure concerning for code which was managed medically by anesthesia.  During this time pressure was held in the fistula above and below the revision site while the fistula was closed. ? ?Findings: There is a large eschar overlying the fistula that was approximately 5 cm long.  This was revised by excising and primarily closure of the fistula and the skin overlying. ?  ?Procedure:  The patient was identified in the holding area and taken to the operating room where initially MAC anesthesia was induced.  He was sterilely prepped draped in the left upper extremity, antibiotics were ministered and a timeout was called.  An Esmarch was used to exsanguinate the left upper extremity and the tourniquet was inflated to 250 mmHg just below the shoulder.  An elliptical and type incision was made around the eschar we dissected down through the fistula and excised all of this tissue.  I freed up the soft tissue around the fistula in order to enable skin closure.  I then filled the fistula with heparinized saline and began closing the fistula with a running 4-0 Prolene suture in a mattress fashion.  Unfortunately prior to completing this the anesthesia staff was concerned that there was loss of pulse.   Given that I had a tourniquet in place I could not feel for pulse myself.  This time the tourniquet was allowed down and assistants were necessary to hold pressure above and below and complete the anastomosis.  We then completed the anastomosis and there was good flow through the fistula patient did have a pulse at that time.  During this time the patient was intubated.  Hemostasis was obtained and we quickly closed the wound with staples.  A sterile dressing was placed.  Given the events of the procedure patient will be plan to admitted to the ICU. ? ?EBL: 100cc ? ? ?Destony Prevost C. Donzetta Matters, MD ?[facility] ?Office: [phone] ?Pager: [phone] ? ? ? ?

## 2021-03-10 NOTE — H&P (Signed)
?  ? ?  ?HPI: ?  ?Nathaniel White is a 61 y.o. male history of left arm AV fistula that is undergone multiple plications.  Fistula continues to work.  Most recently he had a scab on the fistula and the tape piece that has ripped it off and he has had some bleeding from the arm.  He has not had any significant blood loss.  He states that he does not bleed from the cannulation sites.  He does have significant disability the left upper arm has had a fistula on the right side that does not work very long he is hoping to stay with the fistula in the left upper extremity.  He does not take any blood thinners.  He is on dialysis Tuesdays, Thursdays and Saturdays. ?  ?    ?Past Medical History:  ?Diagnosis Date  ? Anemia    ? Anxiety    ? Asthma    ? CHF (congestive heart failure) ([place])    ? Complication from renal dialysis device 11/02/2013  ? Complication of anesthesia 2017  ?  according to pt and spouse pt was moving around and cough while under and pt had difficulty waking up so the anesthesia had to be reversed. and pt admitted to ICU.   ? COPD (chronic obstructive pulmonary disease) ([place])    ? Diabetes mellitus without complication ([place])    ?  Type II - Patient states he does not have diabetes, "they said sometimes when you start dialysis you don't have diabetes any longer"   ? ESOPHAGEAL VARICES 10/04/2008  ?  Qualifier: Diagnosis of  By: Fuller Plan MD Lamont Snowball T   ? ESRD    ?  on HD, M- W- F - [place]  ? Hemiparesis due to old stroke [facility])    ?  left  ? Hypertension    ? LV dysfunction    ?  EF 25-30% by echo 07/2011  ? Memory loss due to medical condition    ?  due to stroke  ? MR (mitral regurgitation)    ?  moderate to severe, echo 07/2011  ? Peripheral vascular disease ([place])    ? Shortness of breath    ? Stroke [facility])    ?  TIA's-left sided weakness  ? Tobacco abuse    ?  ?     ?Family History  ?Problem Relation Age of Onset  ? Hypertension Mother    ? Varicose Veins Mother    ? Diabetes Paternal Grandmother     ? Cancer Paternal Grandfather    ? CAD Paternal Uncle    ?  ?     ?Past Surgical History:  ?Procedure Laterality Date  ? ABDOMINAL AORTOGRAM W/LOWER EXTREMITY Bilateral 07/21/2018  ?  Procedure: ABDOMINAL AORTOGRAM W/LOWER EXTREMITY;  Surgeon: Waynetta Sandy, MD;  Location: [place] CV LAB;  Service: Cardiovascular;  Laterality: Bilateral;  ? AMPUTATION Left 11/05/2018  ?  Procedure: AMPUTATION SECOND TOE LEFT FOOT;  Surgeon: Waynetta Sandy, MD;  Location: [place];  Service: Vascular;  Laterality: Left;  ? AV FISTULA PLACEMENT      ? CARDIAC CATHETERIZATION      ?  [facility]  ? COLONOSCOPY W/ BIOPSIES AND POLYPECTOMY      ? FISTULA SUPERFICIALIZATION Left 11/10/2013  ?  Procedure: FISTULA PLICATION;  Surgeon: Serafina Mitchell, MD;  Location: [place];  Service: Vascular;  Laterality: Left;  ? KIDNEY TRANSPLANT      ?  2011 rejected kidney 2012 back on dialysis  ? LEFT AND RIGHT HEART CATHETERIZATION WITH CORONARY ANGIOGRAM N/A 10/09/2013  ?  Procedure: LEFT AND RIGHT HEART CATHETERIZATION WITH CORONARY ANGIOGRAM;  Surgeon: Burnell Blanks, MD;  Location: [facility] CATH LAB;  Service: Cardiovascular;  Laterality: N/A;  ? PERIPHERAL VASCULAR INTERVENTION Left 07/21/2018  ?  Procedure: PERIPHERAL VASCULAR INTERVENTION;  Surgeon: Waynetta Sandy, MD;  Location: [place] CV LAB;  Service: Cardiovascular;  Laterality: Left;  Popliteal  ? REVISON OF ARTERIOVENOUS FISTULA Left 08/26/2013  ?  Procedure: EXCISION OF ERODED SKIN AND EXPLORATION OF MAIN LEFT UPPER ARM AV FISTULA;  Surgeon: Rosetta Posner, MD;  Location: [place];  Service: Vascular;  Laterality: Left;  ? REVISON OF ARTERIOVENOUS FISTULA Left 02/05/2014  ?  Procedure: REPAIR OF ARTERIOVENOUS FISTULA ANEURYSM;  Surgeon: Serafina Mitchell, MD;  Location: [place];  Service: Vascular;  Laterality: Left;  ? SHUNTOGRAM N/A 11/05/2012  ?  Procedure: Fistulogram;  Surgeon: Serafina Mitchell, MD;  Location: [facility] CATH LAB;  Service: Cardiovascular;   Laterality: N/A;  ? TEE WITHOUT CARDIOVERSION   08/17/2011  ?  Procedure: TRANSESOPHAGEAL ECHOCARDIOGRAM (TEE);  Surgeon: Larey Dresser, MD;  Location: [place];  Service: Cardiovascular;  Laterality: N/A;  ? VIDEO ASSISTED THORACOSCOPY (VATS)/DECORTICATION   08/10/11  ?  ?  ?Short Social History:  ?Social History  ?  ?     ?Tobacco Use  ? Smoking status: Former  ?    Packs/day: 0.50  ?    Years: 15.00  ?    Pack years: 7.50  ?    Types: Cigarettes  ?    Quit date: 2014  ?    Years since quitting: 9.0  ? Smokeless tobacco: Never  ?Substance Use Topics  ? Alcohol use: No  ?    Alcohol/week: 0.0 standard drinks  ?  ?  ?    ?Allergies  ?Allergen Reactions  ? Codeine Shortness Of Breath  ? Other Other (See Comments) and Shortness Of Breath  ? Dextromethorphan-Guaifenesin Other (See Comments)  ? Iron Dextran Other (See Comments)  ? Morphine And Related Other (See Comments)  ?  ?  ?      ?Current Outpatient Medications  ?Medication Sig Dispense Refill  ? acetaminophen (TYLENOL) 500 MG tablet Take 1 tablet (500 mg total) by mouth every 6 (six) hours as needed. 30 tablet 0  ? albuterol (PROAIR HFA) 108 (90 Base) MCG/ACT inhaler Inhale 2 puffs into the lungs every 6 (six) hours as needed for wheezing or shortness of breath. 8.5 g 2  ? aspirin EC 81 MG tablet Take 81 mg by mouth 3 (three) times a week.      ? bacitracin 500 UNIT/GM ointment Apply 1 application topically 2 (two) times daily. 15 g 0  ? cephALEXin (KEFLEX) 500 MG capsule Take 1 capsule (500 mg total) by mouth 3 (three) times daily. 30 capsule 0  ? glucose blood test strip        ? fluticasone-salmeterol (ADVAIR DISKUS) 250-50 MCG/ACT AEPB Inhale 1 puff into the lungs in the morning and at bedtime. 180 each 3  ? montelukast (SINGULAIR) 10 MG tablet Take 1 tablet (10 mg total) by mouth at bedtime. 90 tablet 3  ?  ?No current facility-administered medications for this visit.  ?  ?  ?Review of Systems  ?Constitutional:  Constitutional negative. ?HENT: HENT  negative.  ?Eyes: Eyes negative.  ?Respiratory: Respiratory negative.  ?Cardiovascular: Cardiovascular negative.  ?GI: Gastrointestinal  negative.  ?Musculoskeletal: Musculoskeletal negative.  ?Skin: Positive for wound.  ?Neurological: Neurological negative. ?Hematologic: Hematologic/lymphatic negative.  ?Psychiatric: Psychiatric negative.   ?  ?  ?  ?Objective:  ?  ?Vitals:  ? 03/10/21 1038  ?BP: (!) 93/57  ?Pulse: (!) 101  ?Resp: 17  ?Temp: 98.7 ?F (37.1 ?C)  ?SpO2: 94%  ? ? ?  ?Physical Exam ?HENT:  ?   Head: Normocephalic.  ?   Nose:  ?   Comments: Wearing a mask ?Cardiovascular:  ?   Pulses:     ?     Radial pulses are 1+ on the left side.  ?Pulmonary:  ?   Effort: Pulmonary effort is normal.  ?Abdominal:  ?   General: Abdomen is flat.  ?Musculoskeletal:  ?   Comments: Left upper extremity AV fistula with palpable thrill there is a 1 cm area of thin and denuded skin with evidence of oozing.  ?Neurological:  ?   Mental Status: He is alert.  ?   Comments: Left upper extremity focal weakness  ?  ?  ?Data: ?No studies today ?  ?   ?Assessment/Plan:  ?  ? ? ?61 year old male on dialysis via left upper arm AV fistula on Tuesdays, Thursdays and Saturdays.  He has an area of very thin skin that has had oozing.  We will plan for revision of the fistula today in the OR.  ?  ?  ?  ?  ?Waynetta Sandy MD ?[facility] ?

## 2021-03-10 NOTE — Anesthesia Procedure Notes (Addendum)
Procedure Name: Intubation ?Date/Time: 03/10/2021 2:08 PM ?Performed by: Maude Leriche, CRNA ?Pre-anesthesia Checklist: Patient identified, Emergency Drugs available, Suction available and Patient being monitored ?Patient Re-evaluated:Patient Re-evaluated prior to induction ?Oxygen Delivery Method: Circle system utilized ?Preoxygenation: Pre-oxygenation with 100% oxygen ?Induction Type: IV induction ?Ventilation: Mask ventilation without difficulty and Oral airway inserted - appropriate to patient size ?Laryngoscope Size: Glidescope and 3 ?Grade View: Grade I ?Tube type: Oral ?Tube size: 7.5 mm ?Number of attempts: 1 ?Airway Equipment and Method: Stylet, Oral airway and Video-laryngoscopy ?Placement Confirmation: ETT inserted through vocal cords under direct vision, positive ETCO2 and breath sounds checked- equal and bilateral ?Secured at: 23 cm ?Tube secured with: Tape ?Dental Injury: Teeth and Oropharynx as per pre-operative assessment  ?Comments: Elective glidescope in emergent setting based on previous intubation note ? ? ? ? ?

## 2021-03-10 NOTE — Transfer of Care (Signed)
Immediate Anesthesia Transfer of Care Note ? ?Patient: Nathaniel White ? ?Procedure(s) Performed: LEFT ARM REVISION OF ARTERIOVENOUS FISTULA WITH PLICATION (Left: Arm Upper) ? ?Patient Location: ICU ? ?Anesthesia Type:General ? ?Level of Consciousness: sedated and Patient remains intubated per anesthesia plan ? ?Airway & Oxygen Therapy: Patient remains intubated per anesthesia plan and Patient placed on Ventilator (see vital sign flow sheet for setting) ? ?Post-op Assessment: Report given to RN and Post -op Vital signs reviewed and stable ? ?Post vital signs: Reviewed ? ?Last Vitals:  ?Vitals Value Taken Time  ?BP 121/50   ?Temp 36.8   ?Pulse 68   ?Resp 18   ?SpO2 94   ? ? ?Last Pain:  ?Vitals:  ? 03/10/21 1053  ?PainSc: 5   ?   ? ?Patients Stated Pain Goal: 0 (03/10/21 1053) ? ?Complications: No notable events documented. ?

## 2021-03-11 ENCOUNTER — Inpatient Hospital Stay (HOSPITAL_COMMUNITY): Payer: Medicare Other

## 2021-03-11 LAB — POCT I-STAT, CHEM 8
BUN: 30 mg/dL — ABNORMAL HIGH (ref 6–20)
Calcium, Ion: 1.18 mmol/L (ref 1.15–1.40)
Chloride: 99 mmol/L (ref 98–111)
Creatinine, Ser: 8 mg/dL — ABNORMAL HIGH (ref 0.61–1.24)
Glucose, Bld: 126 mg/dL — ABNORMAL HIGH (ref 70–99)
HCT: 43 % (ref 39.0–52.0)
Hemoglobin: 14.6 g/dL (ref 13.0–17.0)
Potassium: 3.4 mmol/L — ABNORMAL LOW (ref 3.5–5.1)
Sodium: 139 mmol/L (ref 135–145)
TCO2: 28 mmol/L (ref 22–32)

## 2021-03-11 LAB — BASIC METABOLIC PANEL
Anion gap: 16 — ABNORMAL HIGH (ref 5–15)
BUN: 34 mg/dL — ABNORMAL HIGH (ref 6–20)
CO2: 26 mmol/L (ref 22–32)
Calcium: 9.6 mg/dL (ref 8.9–10.3)
Chloride: 97 mmol/L — ABNORMAL LOW (ref 98–111)
Creatinine, Ser: 7.92 mg/dL — ABNORMAL HIGH (ref 0.61–1.24)
GFR, Estimated: 7 mL/min — ABNORMAL LOW (ref >60)
Glucose, Bld: 79 mg/dL (ref 70–99)
Potassium: 5.5 mmol/L — ABNORMAL HIGH (ref 3.5–5.1)
Sodium: 139 mmol/L (ref 135–145)

## 2021-03-11 LAB — CBC
HCT: 44 % (ref 39.0–52.0)
Hemoglobin: 13.9 g/dL (ref 13.0–17.0)
MCH: 29.9 pg (ref 26.0–34.0)
MCHC: 31.6 g/dL (ref 30.0–36.0)
MCV: 94.6 fL (ref 80.0–100.0)
Platelets: 75 10*3/uL — ABNORMAL LOW (ref 150–400)
RBC: 4.65 MIL/uL (ref 4.22–5.81)
RDW: 16.1 % — ABNORMAL HIGH (ref 11.5–15.5)
WBC: 5.4 10*3/uL (ref 4.0–10.5)
nRBC: 0 % (ref 0.0–0.2)

## 2021-03-11 LAB — GLUCOSE, CAPILLARY
Glucose-Capillary: 114 mg/dL — ABNORMAL HIGH (ref 70–99)
Glucose-Capillary: 156 mg/dL — ABNORMAL HIGH (ref 70–99)
Glucose-Capillary: 73 mg/dL (ref 70–99)
Glucose-Capillary: 80 mg/dL (ref 70–99)
Glucose-Capillary: 81 mg/dL (ref 70–99)
Glucose-Capillary: 82 mg/dL (ref 70–99)

## 2021-03-11 LAB — HEPATITIS B SURFACE ANTIGEN: Hepatitis B Surface Ag: NONREACTIVE

## 2021-03-11 LAB — MRSA NEXT GEN BY PCR, NASAL: MRSA by PCR Next Gen: NOT DETECTED

## 2021-03-11 LAB — TRIGLYCERIDES: Triglycerides: 83 mg/dL (ref <150)

## 2021-03-11 MED ORDER — MIDODRINE HCL 5 MG PO TABS
5.0000 mg | ORAL_TABLET | Freq: Three times a day (TID) | ORAL | Status: DC
  Administered 2021-03-11: 5 mg
  Filled 2021-03-11: qty 1

## 2021-03-11 MED ORDER — SODIUM POLYSTYRENE SULFONATE 15 GM/60ML PO SUSP
30.0000 g | Freq: Once | ORAL | Status: AC
Start: 2021-03-11 — End: 2021-03-11
  Administered 2021-03-11: 30 g
  Filled 2021-03-11: qty 120

## 2021-03-11 NOTE — Procedures (Signed)
Extubation Procedure Note ? ?Patient Details:   ?Name: Nathaniel White ?DOB: 16-Nov-1960 ?MRN: [id number] ?  ?Airway Documentation:  ? Pt extubated per orders. Patient had positive cuff leak prior to extubation. Placed on 5lpm nasal cannula. RN at bedside. ?Vent end date: 03/11/21 Vent end time: 8832  ? ?Evaluation ? O2 sats: stable throughout ?Complications: No apparent complications ?Patient did tolerate procedure well. ?Bilateral Breath Sounds: Clear, Diminished ?  ?Yes ? ?[name] ?03/11/2021, 11:44 AM ? ?

## 2021-03-11 NOTE — Progress Notes (Signed)
? ?  NAME:  Nathaniel White, MRN:  [id number], DOB:  1960-12-26, LOS: 1 ?ADMISSION DATE:  03/10/2021, CONSULTATION DATE:  3/3 ?REFERRING MD:  Donzetta Matters, CHIEF COMPLAINT:  vent weaning and critical care support  ? ?History of Present Illness:  ?61 year old male w/ sig PMH as outlined below most significantly ESRD and Left arm AVF which has required multiple plications in past. Presented to the OR for planned revision of fistula due to recent area of denuded skin and oozing blood after scab removed.  ?Was brought to OR. Was getting Propofol for sedation. Shortly after administration BP rapidly declined w/ SBP initially in 150s to < 100 and pt was no longer spontaneously breathing. Received: calcium, vasopressin and neo  stick. Intubated. Maintained on neo gtt and transferred to ICU  ? ?Pertinent  Medical History  ?ESRD (MWF), asthma, CHF, difficulty waking up from anesthesia in past, COPD, type II DM, esophageal varices, prior stroke w/ left sided deficits, HTN, HFrEF (40-45%), MR, memory loss, prior stroke, MR, tobacco abuse  ? ?Significant Hospital Events: ?Including procedures, antibiotic start and stop dates in addition to other pertinent events   ?3/3 admitted to ICU from OR after precipitous hypotension following propofol sedation complicated further by respiratory failure requiring intubation.  Revision was completed of AVG ? ?Interim History / Subjective:  ?No events. ?Heavily sedated. ?On low dose pressors. ? ?Objective   ?Blood pressure 128/65, pulse 64, temperature (!) 96.8 ?F (36 ?C), temperature source Oral, resp. rate 15, height '5\' 9"'$  (1.753 m), weight 81.6 kg, SpO2 94 %. ?   ?Vent Mode: PRVC ?FiO2 (%):  [40 %-100 %] 40 % ?Set Rate:  [12 bmp-18 bmp] 12 bmp ?Vt Set:  [500 mL-560 mL] 500 mL ?PEEP:  [5 cmH20] 5 cmH20 ?Plateau Pressure:  [21 cmH20-25 cmH20] 21 cmH20  ? ?Intake/Output Summary (Last 24 hours) at 03/11/2021 1005 ?Last data filed at 03/11/2021 0200 ?Gross per 24 hour  ?Intake 478.12 ml  ?Output 100 ml  ?Net  378.12 ml  ? ?Filed Weights  ? 03/09/21 1418 03/10/21 1038  ?Weight: 79.4 kg 81.6 kg  ? ? ?Examination: ?No distress ?Lungs clear ?LUE wrapped ?RUE swollen ?Weaning sedation for neuro exam ?Abdomen soft ?Ext warm ? ?K a bit up ? ?Resolved Hospital Problem list   ? ? ?Assessment & Plan:  ?Periop near-arrest.  Question related to fragile baseline cardiopulmonary status.  Improving rapidly. ?Postop vent management ?Multiple chronic medical issues ?DM2- CBGs okay here ?Thrombocytopenia ? ?- SAT/SBT consider extubation ?- Consult nephro for HD ?- SSI ?- Hopefully post extubation can get off phenylephrine, start low dose midodrine ? ?Best Practice (right click and "Reselect all SmartList Selections" daily)  ? ?Diet/type: NPO ?DVT prophylaxis: SCD ?GI prophylaxis: PPI ?Lines: N/A ?Foley:  N/A ?Code Status:  full code ?Last date of multidisciplinary goals of care discussion [pending ] ? ?33 min cc time managing vent/sedation ?Erskine Emery MD PCCM ? ?

## 2021-03-11 NOTE — Consult Note (Signed)
Nathaniel White Admit Date: 03/10/2021 03/11/2021 Nathaniel White Requesting Physician:  Charlsie Quest MD  Reason for Consult:  ESRD comanagement HPI:  63M ESRD THS [facility] who underwent revision of left arm AV fistula with plication on 04/17/15 because of progressive eschar and risk of rupture.  In the perioperative setting he had a acute drop in blood pressure and period of apnea requiring treatment with calcium, vasopressors, and intubation.  He transferred to the ICU overnight, and his mental status is improved, respiratory status is stabilized with plan for extubation today.  He has continued on phenylephrine.  Vascular surgery notes reviewed.  He should have sufficient target area on his arm access for cannulation.  Tunneled catheter was not placed.  Last HD 3/2  Outpt HD Orders Unit: [facility] Days: THS Time: 4h 91mn Dialyzer: F180 EDW: 76kg K/Ca: 2/2 Access: LUE AVF Needle Size: 15g x2 BFR/DFR: 450/500 VDRA: Hectorol 4 qTx EPO: none IV Fe: none Heparin: none   PMH Incudes: CHF COPD DM2 Hx/o esopageal varices Hx/o CVA  ROS Pt intubated, unable to assess  PMH  Past Medical History:  Diagnosis Date   Anemia    Anxiety    Asthma    CHF (congestive heart failure) ([place]    Complication from renal dialysis device 191/50/5697  Complication of anesthesia 2017   according to pt and spouse pt was moving around and cough while under and pt had difficulty waking up so the anesthesia had to be reversed. and pt admitted to ICU.    COPD (chronic obstructive pulmonary disease) ([facility])    Diabetes mellitus without complication ([place]    Type II - Patient states he does not have diabetes, "they said sometimes when you start dialysis you don't have diabetes any longer"    ESOPHAGEAL VARICES 10/04/2008   Qualifier: Diagnosis of  By: SFuller PlanMD FLamont SnowballT    ESRD    on HD, T-TH-Sat - [facility]   Hemiparesis due to old stroke ([facility]    left   Hypertension    Hx, not current  problems, no meds   LV dysfunction    EF 25-30% by echo 07/2011   Memory loss due to medical condition    due to stroke   MR (mitral regurgitation)    moderate to severe, echo 07/2011   Peripheral vascular disease ([place]    Shortness of breath    with exertion   Stroke ([facility]    TIA's-left sided weakness   Tobacco abuse    PSH  Past Surgical History:  Procedure Laterality Date   ABDOMINAL AORTOGRAM W/LOWER EXTREMITY Bilateral 07/21/2018   Procedure: ABDOMINAL AORTOGRAM W/LOWER EXTREMITY;  Surgeon: CWaynetta Sandy MD;  [place];  Service: Cardiovascular;  Laterality: Bilateral;   AMPUTATION Left 11/05/2018   Procedure: AMPUTATION SECOND TOE LEFT FOOT;  Surgeon: CWaynetta Sandy MD;  [place]  Service: Vascular;  Laterality: Left;   AMPUTATION Left 02/13/2021   Procedure: Left small finger AMPUTATION DIGIT;  Surgeon: BSherilyn Cooter MD;  [place]  Service: Orthopedics;  Laterality: Left;   AV FISTULA PLACEMENT     CARDIAC CATHETERIZATION     [place]   COLONOSCOPY W/ BIOPSIES AND POLYPECTOMY     FISTULA SUPERFICIALIZATION Left 11/10/2013   Procedure: FISTULA PLICATION;  Surgeon: VSerafina Mitchell MD;  [place]  Service: Vascular;  Laterality: Left;   KIDNEY TRANSPLANT     2011 rejected kidney 2012 back  on dialysis   LEFT AND RIGHT HEART CATHETERIZATION WITH CORONARY ANGIOGRAM N/A 10/09/2013   Procedure: LEFT AND RIGHT HEART CATHETERIZATION WITH CORONARY ANGIOGRAM;  Surgeon: Burnell Blanks, MD;  Location: [facility] CATH LAB;  Service: Cardiovascular;  Laterality: N/A;   PERIPHERAL VASCULAR INTERVENTION Left 07/21/2018   Procedure: PERIPHERAL VASCULAR INTERVENTION;  Surgeon: Waynetta Sandy, MD;  Location: [place] CV LAB;  Service: Cardiovascular;  Laterality: Left;  Popliteal   REVISON OF ARTERIOVENOUS FISTULA Left 08/26/2013   Procedure: EXCISION OF ERODED SKIN AND EXPLORATION OF MAIN LEFT UPPER ARM AV  FISTULA;  Surgeon: Rosetta Posner, MD;  Location: [place];  Service: Vascular;  Laterality: Left;   REVISON OF ARTERIOVENOUS FISTULA Left 02/05/2014   Procedure: REPAIR OF ARTERIOVENOUS FISTULA ANEURYSM;  Surgeon: Serafina Mitchell, MD;  Location: [place];  Service: Vascular;  Laterality: Left;   SHUNTOGRAM N/A 11/05/2012   Procedure: Fistulogram;  Surgeon: Serafina Mitchell, MD;  Location: [facility] CATH LAB;  Service: Cardiovascular;  Laterality: N/A;   TEE WITHOUT CARDIOVERSION  08/17/2011   Procedure: TRANSESOPHAGEAL ECHOCARDIOGRAM (TEE);  Surgeon: Larey Dresser, MD;  Location: [facility] ENDOSCOPY;  Service: Cardiovascular;  Laterality: N/A;   VIDEO ASSISTED THORACOSCOPY (VATS)/DECORTICATION  08/10/11   FH  Family History  Problem Relation Age of Onset   Hypertension Mother    Varicose Veins Mother    Diabetes Paternal Grandmother    Cancer Paternal Grandfather    CAD Paternal Uncle    [place]  reports that he quit smoking about 9 years ago. His smoking use included cigarettes. He has a 7.50 pack-year smoking history. He has never used smokeless tobacco. He reports that he does not drink alcohol and does not use drugs. Allergies  Allergies  Allergen Reactions   Codeine Shortness Of Breath   Other Other (See Comments) and Shortness Of Breath   Dextromethorphan-Guaifenesin Other (See Comments)   Iron Dextran Other (See Comments)   Morphine And Related Other (See Comments)   Home medications Prior to Admission medications   Medication Sig Start Date End Date Taking? Authorizing Provider  acetaminophen (TYLENOL) 500 MG tablet Take 1 tablet (500 mg total) by mouth every 6 (six) hours as needed. 04/11/18  Yes Burky, Malachy Moan, NP  albuterol (PROAIR HFA) 108 (90 Base) MCG/ACT inhaler Inhale 2 puffs into the lungs every 6 (six) hours as needed for wheezing or shortness of breath. 01/11/21  Yes Dorna Mai, MD  aspirin EC 81 MG tablet Take 81 mg by mouth daily.   Yes [provider]  bacitracin 500 UNIT/GM  ointment Apply 1 application topically 2 (two) times daily. 12/29/20  Yes Fenton Foy, NP  cephALEXin (KEFLEX) 500 MG capsule Take 1 capsule (500 mg total) by mouth 3 (three) times daily. 01/25/21  Yes McClung, Dionne Bucy, PA-C  oxyCODONE (ROXICODONE) 5 MG immediate release tablet Take 1 tablet (5 mg total) by mouth every 4 (four) hours as needed for severe pain. 02/13/21  Yes Sherilyn Cooter, MD  fluticasone-salmeterol (ADVAIR DISKUS) 250-50 MCG/ACT AEPB Inhale 1 puff into the lungs in the morning and at bedtime. 07/27/20 10/25/20  Gildardo Pounds, NP  glucose blood test strip  12/26/09   [provider]  midodrine (PROAMATINE) 10 MG tablet Take 10 mg by mouth 3 (three) times a week. 01/17/21   [provider]  montelukast (SINGULAIR) 10 MG tablet Take 1 tablet (10 mg total) by mouth at bedtime. 07/27/20 10/25/20  Gildardo Pounds, NP  VELPHORO 500 MG  chewable tablet Chew 1,000 mg by mouth 3 (three) times daily. 02/06/21   [provider]    Current Medications Scheduled Meds:  chlorhexidine gluconate (MEDLINE KIT)  15 mL Mouth Rinse BID   Chlorhexidine Gluconate Cloth  6 each Topical Daily   docusate  100 mg Per Tube BID   insulin aspart  0-6 Units Subcutaneous Q4H   mouth rinse  15 mL Mouth Rinse 10 times per day   midodrine  5 mg Per Tube TID WC   pantoprazole (PROTONIX) IV  40 mg Intravenous Daily   polyethylene glycol  17 g Per Tube Daily   Continuous Infusions:  sodium chloride     phenylephrine (NEO-SYNEPHRINE) Adult infusion 65 mcg/min (03/11/21 1102)   propofol (DIPRIVAN) infusion Stopped (03/11/21 1038)   PRN Meds:.fentaNYL (SUBLIMAZE) injection  CBC Recent Labs  Lab 03/10/21 1616 03/10/21 1712 03/10/21 2118 03/11/21 0400  WBC 4.4  --   --  5.4  HGB 13.1 14.6 14.6 13.9  HCT 40.5 43.0 43.0 44.0  MCV 92.0  --   --  94.6  PLT 69*  --   --  75*   Basic Metabolic Panel Recent Labs  Lab 03/10/21 1106 03/10/21 1417 03/10/21 1524  03/10/21 1536 03/10/21 1616 03/10/21 1712 03/10/21 2118 03/11/21 0400  NA 139 136 139 140 139 138 138 139  K 3.8 5.1 3.4* 3.1* 3.6 3.7 4.6 5.5*  CL 96*  --  99  --  98  --   --  97*  CO2  --   --   --   --  28  --   --  26  GLUCOSE 88  --  126*  --  88  --   --  79  BUN 31*  --  30*  --  30*  --   --  34*  CREATININE 7.80*  --  8.00*  --  7.61*  --   --  7.92*  CALCIUM  --   --   --   --  10.1  --   --  9.6    Physical Exam   Blood pressure (!) 91/59, pulse 75, temperature (!) 96.8 F (36 C), temperature source Oral, resp. rate 17, height 5' 9" (1.753 m), weight 81.6 kg, SpO2 91 %. GEN: intubated, following commands ENT: NCAT, ETT in place EYES: Eyes open ,tracks CV: RRR, nl s1s2 PULM: CTAB ABD: s/nt/nd SKIN: LUE AVF with about 2-3in of stables EXT:No LEE  Assessment 33M ESRD THS with acute cardiac/pulm event perioperative of LUE AVF plication/revision.    ESRD THS LUE AVF S/p plication/ revision of LUE AVF Hypotensive / Apneic event in perioperative setting, improved Intubated, improving on PSV HFrEF CKD-BMM: cont hectorol, binder as able Anemia: not current issue HTN/Vol: on low dose pressors at this time  Plan HD today: use AVF, 16g cannulate away from staples, 2K, 3.5h, 1-2L UF, no heparin Will follow along   Nathaniel White  [phone] pgr 03/11/2021, 11:48 AM

## 2021-03-11 NOTE — Progress Notes (Signed)
?  Daily Progress Note ? ?S/p: Left upper extremity fistula revision, complicated by PEA arrest, intubated ? ?Subjective: ?Intubated, sedated this morning ? ?Objective: ?Vitals:  ? 03/11/21 0630 03/11/21 0751  ?BP:  (!) 103/41  ?Pulse: 73 61  ?Resp: 19 13  ?Temp:  (!) 96.8 ?F (36 ?C)  ?SpO2: 95% 94%  ?  ?Physical Examination ?Left-sided fistula with thrill, multiphasic signals at the wrist ?Staple line intact, dry ? ?ASSESSMENT/PLAN:  ?Appreciate critical care involvement ?Extubate when able ?Can use fistula for dialysis, please do not access at staple site. ? ? ?J. Melene Muller MD MS ?[facility] ?[phone] ?03/11/2021  ?8:40 AM ? ?

## 2021-03-11 NOTE — Care Management (Signed)
?  Transition of Care (TOC) Screening Note ? ? ?Patient Details  ?Name: Nathaniel White ?Date of Birth: January 01, 1961 ? ? ?Transition of Care (TOC) CM/SW Contact:    ?Graves-Bigelow, Ocie Cornfield, RN ?Phone Number: ?03/11/2021, 11:54 AM ? ? ? ?Transition of Care Department [facility]) has reviewed patient and no TOC needs have been identified at this time. We will continue to monitor patient advancement through interdisciplinary progression rounds. If new patient transition needs arise, please place a TOC consult. ? ? ?

## 2021-03-12 LAB — BASIC METABOLIC PANEL
Anion gap: 13 (ref 5–15)
BUN: 16 mg/dL (ref 6–20)
CO2: 27 mmol/L (ref 22–32)
Calcium: 8.8 mg/dL — ABNORMAL LOW (ref 8.9–10.3)
Chloride: 96 mmol/L — ABNORMAL LOW (ref 98–111)
Creatinine, Ser: 4.75 mg/dL — ABNORMAL HIGH (ref 0.61–1.24)
GFR, Estimated: 13 mL/min — ABNORMAL LOW (ref >60)
Glucose, Bld: 110 mg/dL — ABNORMAL HIGH (ref 70–99)
Potassium: 3.1 mmol/L — ABNORMAL LOW (ref 3.5–5.1)
Sodium: 136 mmol/L (ref 135–145)

## 2021-03-12 LAB — GLUCOSE, CAPILLARY
Glucose-Capillary: 112 mg/dL — ABNORMAL HIGH (ref 70–99)
Glucose-Capillary: 128 mg/dL — ABNORMAL HIGH (ref 70–99)
Glucose-Capillary: 137 mg/dL — ABNORMAL HIGH (ref 70–99)
Glucose-Capillary: 114 mg/dL — ABNORMAL HIGH (ref 70–99)

## 2021-03-12 LAB — HEPATIC FUNCTION PANEL
ALT: <5 U/L (ref 0–44)
AST: 19 U/L (ref 15–41)
Albumin: 3 g/dL — ABNORMAL LOW (ref 3.5–5.0)
Alkaline Phosphatase: 66 U/L (ref 38–126)
Bilirubin, Direct: 0.3 mg/dL — ABNORMAL HIGH (ref 0.0–0.2)
Indirect Bilirubin: 0.4 mg/dL (ref 0.3–0.9)
Total Bilirubin: 0.7 mg/dL (ref 0.3–1.2)
Total Protein: 7.4 g/dL (ref 6.5–8.1)

## 2021-03-12 LAB — CBC
HCT: 44.7 % (ref 39.0–52.0)
Hemoglobin: 14.2 g/dL (ref 13.0–17.0)
MCH: 30.4 pg (ref 26.0–34.0)
MCHC: 31.8 g/dL (ref 30.0–36.0)
MCV: 95.7 fL (ref 80.0–100.0)
Platelets: 56 10*3/uL — ABNORMAL LOW (ref 150–400)
RBC: 4.67 MIL/uL (ref 4.22–5.81)
RDW: 15.9 % — ABNORMAL HIGH (ref 11.5–15.5)
WBC: 5 10*3/uL (ref 4.0–10.5)
nRBC: 0 % (ref 0.0–0.2)

## 2021-03-12 LAB — MAGNESIUM: Magnesium: 2.1 mg/dL (ref 1.7–2.4)

## 2021-03-12 LAB — HEPATITIS B SURFACE ANTIBODY,QUALITATIVE: Hep B S Ab: REACTIVE — AB

## 2021-03-12 LAB — HEPATITIS B SURFACE ANTIGEN: Hepatitis B Surface Ag: NONREACTIVE

## 2021-03-12 MED ORDER — OXYCODONE HCL 5 MG PO TABS
5.0000 mg | ORAL_TABLET | ORAL | Status: DC | PRN
  Administered 2021-03-12: 5 mg via ORAL
  Filled 2021-03-12: qty 1

## 2021-03-12 MED ORDER — FLUTICASONE FUROATE-VILANTEROL 100-25 MCG/ACT IN AEPB
1.0000 | INHALATION_SPRAY | Freq: Every day | RESPIRATORY_TRACT | Status: DC
  Administered 2021-03-13: 1 via RESPIRATORY_TRACT
  Filled 2021-03-12: qty 28

## 2021-03-12 MED ORDER — SODIUM CHLORIDE 0.9 % IV SOLN: 100.0000 mL | INTRAVENOUS | Status: DC | PRN

## 2021-03-12 MED ORDER — DOCUSATE SODIUM 100 MG PO CAPS
100.0000 mg | ORAL_CAPSULE | Freq: Two times a day (BID) | ORAL | Status: DC
  Administered 2021-03-12 – 2021-03-15 (×4): 100 mg via ORAL
  Filled 2021-03-12 (×4): qty 1

## 2021-03-12 MED ORDER — ACETAMINOPHEN 500 MG PO TABS: 500.0000 mg | ORAL_TABLET | Freq: Four times a day (QID) | ORAL | Status: DC | PRN

## 2021-03-12 MED ORDER — MIDODRINE HCL 5 MG PO TABS
10.0000 mg | ORAL_TABLET | Freq: Three times a day (TID) | ORAL | Status: DC
  Administered 2021-03-12 – 2021-03-15 (×10): 10 mg
  Filled 2021-03-12 (×10): qty 2

## 2021-03-12 MED ORDER — POTASSIUM CHLORIDE CRYS ER 20 MEQ PO TBCR
40.0000 meq | EXTENDED_RELEASE_TABLET | Freq: Once | ORAL | Status: AC
  Administered 2021-03-12: 40 meq via ORAL
  Filled 2021-03-12: qty 2

## 2021-03-12 MED ORDER — MONTELUKAST SODIUM 10 MG PO TABS
10.0000 mg | ORAL_TABLET | Freq: Every day | ORAL | Status: DC
Start: 2021-03-12 — End: 2021-03-15
  Administered 2021-03-12 – 2021-03-14 (×3): 10 mg via ORAL
  Filled 2021-03-12 (×3): qty 1

## 2021-03-12 MED ORDER — DOXERCALCIFEROL 4 MCG/2ML IV SOLN
4.0000 ug | INTRAVENOUS | Status: DC
  Administered 2021-03-14: 4 ug via INTRAVENOUS
  Filled 2021-03-12 (×2): qty 2

## 2021-03-12 NOTE — Progress Notes (Signed)
Admit: 03/10/2021 ?LOS: 2 ? ?61M ESRD THS with acute cardiac/pulm event perioperative of LUE AVF plication/revision.   ? ?Subjective:  ?HD overnight, 2L, used AVF ?Extubated, conversant today, no c/o  ? ?03/04 0701 - 03/05 0700 ?In: 1212.8 [P.O.:300; I.V.:912.8] ?Out: 2000  ? ?Filed Weights  ? 03/09/21 1418 03/10/21 1038 03/12/21 0215  ?Weight: 79.4 kg 81.6 kg 79.5 kg  ? ? ?Scheduled Meds: ? chlorhexidine gluconate (MEDLINE KIT)  15 mL Mouth Rinse BID  ? Chlorhexidine Gluconate Cloth  6 each Topical Daily  ? docusate  100 mg Per Tube BID  ? insulin aspart  0-6 Units Subcutaneous Q4H  ? mouth rinse  15 mL Mouth Rinse 10 times per day  ? midodrine  10 mg Per Tube TID WC  ? pantoprazole (PROTONIX) IV  40 mg Intravenous Daily  ? polyethylene glycol  17 g Per Tube Daily  ? ?Continuous Infusions: ? sodium chloride    ? sodium chloride    ? sodium chloride    ? phenylephrine (NEO-SYNEPHRINE) Adult infusion 80 mcg/min (03/12/21 0453)  ? propofol (DIPRIVAN) infusion Stopped (03/11/21 1038)  ? ?PRN Meds:.sodium chloride, sodium chloride, fentaNYL (SUBLIMAZE) injection ? ?Current Labs: reviewed  ? ? ?Physical Exam:  Blood pressure 140/68, pulse 72, temperature 98.4 ?F (36.9 ?C), temperature source Oral, resp. rate 20, height 5' 9" (1.753 m), weight 79.5 kg, SpO2 99 %. ?GEN: awake, conversant, following commands ?ENT: NCAT, ?EYES: EOMI ?CV: RRR, nl s1s2 ?PULM: CTAB ?ABD: s/nt/nd ?SKIN: LUE AVF with about 2-3in of staples ?EXT:No LEE ? ?Outpt HD Orders ?Unit: [facility] ?Days: THS ?Time: 4h 1mn ?Dialyzer: F180 ?EDW: 76kg ?K/Ca: 2/2 ?Access: LUE AVF ?Needle Size: 15g x2 ?BFR/DFR: 450/500 ?VDRA: Hectorol 4 qTx ?EPO: none ?IV Fe: none ?Heparin: none ? ?A ?ESRD THS LUE AVF ?Used AVF post operatively 03/11/21 ?Cont THS 16g cannulate away from staples, 2K, 3.5h, 1-2L UF, no heparin ?S/p plication/ revision of LUE AVF 03/10/21 ?Hypotensive / Apneic event in perioperative setting, improved; extubated ?HFrEF ?CKD-BMM: cont hectorol, binder  as able ?Anemia: not current issue ?HTN/Vol: on low dose pressors at this time ? ?P ?As above ?Medication Issues; ?Preferred narcotic agents for pain control are hydromorphone, fentanyl, and methadone. Morphine should not be used.  ?Baclofen should be avoided ?Avoid oral sodium phosphate and magnesium citrate based laxatives / bowel preps  ? ? ?RPearson GrippeMD ?03/12/2021, 9:04 AM ? ?Recent Labs  ?Lab 03/10/21 ?1616 03/10/21 ?1712 03/10/21 ?2118 03/11/21 ?0400 03/12/21 ?0427  ?NA 139   < > 138 139 136  ?K 3.6   < > 4.6 5.5* 3.1*  ?CL 98  --   --  97* 96*  ?CO2 28  --   --  26 27  ?GLUCOSE 88  --   --  79 110*  ?BUN 30*  --   --  34* 16  ?CREATININE 7.61*  --   --  7.92* 4.75*  ?CALCIUM 10.1  --   --  9.6 8.8*  ? < > = values in this interval not displayed.  ? ?Recent Labs  ?Lab 03/10/21 ?1616 03/10/21 ?1712 03/10/21 ?2118 03/11/21 ?0400 03/12/21 ?0427  ?WBC 4.4  --   --  5.4 5.0  ?HGB 13.1   < > 14.6 13.9 14.2  ?HCT 40.5   < > 43.0 44.0 44.7  ?MCV 92.0  --   --  94.6 95.7  ?PLT 69*  --   --  75* 56*  ? < > = values in this  interval not displayed.  ? ? ? ? ? ? ? ? ? ?  ?

## 2021-03-12 NOTE — Progress Notes (Addendum)
? ?  NAME:  Nathaniel White, MRN:  [id number], DOB:  1961/01/02, LOS: 2 ?ADMISSION DATE:  03/10/2021, CONSULTATION DATE:  3/3 ?REFERRING MD:  Donzetta Matters, CHIEF COMPLAINT:  vent weaning and critical care support  ? ?History of Present Illness:  ?61 year old male w/ sig PMH as outlined below most significantly ESRD and Left arm AVF which has required multiple plications in past. Presented to the OR for planned revision of fistula due to recent area of denuded skin and oozing blood after scab removed.  ?Was brought to OR. Was getting Propofol for sedation. Shortly after administration BP rapidly declined w/ SBP initially in 150s to < 100 and pt was no longer spontaneously breathing. Received: calcium, vasopressin and neo  stick. Intubated. Maintained on neo gtt and transferred to ICU  ? ?Pertinent  Medical History  ?ESRD (MWF), asthma, CHF, difficulty waking up from anesthesia in past, COPD, type II DM, esophageal varices, prior stroke w/ left sided deficits, HTN, HFrEF (40-45%), MR, memory loss, prior stroke, MR, tobacco abuse  ? ?Significant Hospital Events: ?Including procedures, antibiotic start and stop dates in addition to other pertinent events   ?3/3 admitted to ICU from OR after precipitous hypotension following propofol sedation complicated further by respiratory failure requiring intubation.  Revision was completed of AVG ?3/4 extubated, iHD ? ?Interim History / Subjective:  ?Remains on low dose pressors. ?Denies pain. ?Breathing status improved but still on 6LPM. ? ?Objective   ?Blood pressure 140/68, pulse 72, temperature 98.4 ?F (36.9 ?C), temperature source Oral, resp. rate 20, height '5\' 9"'$  (1.753 m), weight 79.5 kg, SpO2 99 %. ?   ?Vent Mode: PSV;CPAP ?PEEP:  [5 cmH20] 5 cmH20 ?Pressure Support:  [10 cmH20] 10 cmH20  ? ?Intake/Output Summary (Last 24 hours) at 03/12/2021 0910 ?Last data filed at 03/12/2021 0630 ?Gross per 24 hour  ?Intake 1098.24 ml  ?Output 2000 ml  ?Net -901.76 ml  ? ? ?Filed Weights  ? 03/09/21  1418 03/10/21 1038 03/12/21 0215  ?Weight: 79.4 kg 81.6 kg 79.5 kg  ? ? ?Examination: ?No distress ?Lungs are clear ?Fistula site wrapped ?Ext warm ?Heart sounds irregular, lots of PVCs ?Moves all 4 ext to command ?Aox3 ?Deconditioned, uses wheelchair  ? ?Chemistries improved after HD ? ?Resolved Hospital Problem list   ? ? ?Assessment & Plan:  ?Periop near-arrest.  Question related to fragile baseline cardiopulmonary status.  Improving rapidly. ?Postop shock- again think this is just related to acute on chronic cardiorenal vasoplegia ?Postop vent management ?Multiple chronic medical issues ?DM2- his A1c is 5, I do not think he has this anymore ?Thrombocytopenia ?Deconditioning ? ?- DC CBG checks ?- Replete K, watch ectopy, may need amio ?- Increase midodrine, wean phenylephrine to MAP 65 ?- PT/OT consults ?- Encourage IS, wean [place], not on O2 at home ? ?Best Practice (right click and "Reselect all SmartList Selections" daily)  ? ?Diet/type: renal ?DVT prophylaxis: SCD with chronic thrombocytopenia ?GI prophylaxis: PPI ?Lines: N/A ?Foley:  N/A ?Code Status:  full code ?Last date of multidisciplinary goals of care discussion [full scope ] ? ? ?Patient critically ill due to shock ?Interventions to address this today pressor titration ?Risk of deterioration without these interventions is high ? ?I personally spent 31 minutes providing critical care not including any separately billable procedures ? ?Erskine Emery MD ?[facility] ? ?Prefer epic messenger for cross cover needs ?If after hours, please call E-link ? ? ?

## 2021-03-12 NOTE — Progress Notes (Addendum)
?  Progress Note ? ? ? ?03/12/2021 ?8:34 AM ?2 Days Post-Op ? ?Subjective:  Extubated.  Unaware of perioperative events ? ? ?Vitals:  ? 03/12/21 0630 03/12/21 0729  ?BP: 140/68   ?Pulse:    ?Resp:    ?Temp: (!) 97.3 ?F (36.3 ?C) 98.4 ?F (36.9 ?C)  ?SpO2:    ? ?Physical Exam: ?Lungs:  non labored ?Incisions:  L arm incision c/d/i ?Extremities:  palpable L arm thrill ?Neurologic: A&O ? ?CBC ?   ?Component Value Date/Time  ? WBC 5.0 03/12/2021 0427  ? RBC 4.67 03/12/2021 0427  ? HGB 14.2 03/12/2021 0427  ? HGB 11.5 (L) 01/17/2015 1454  ? HCT 44.7 03/12/2021 0427  ? HCT 35.7 (L) 01/17/2015 1454  ? PLT 56 (L) 03/12/2021 0427  ? PLT 78 (L) 01/17/2015 1454  ? MCV 95.7 03/12/2021 0427  ? MCV 96.5 01/17/2015 1454  ? MCH 30.4 03/12/2021 0427  ? MCHC 31.8 03/12/2021 0427  ? RDW 15.9 (H) 03/12/2021 0427  ? RDW 15.5 (H) 01/17/2015 1454  ? LYMPHSABS 0.7 (L) 01/17/2015 1454  ? MONOABS 0.3 01/17/2015 1454  ? EOSABS 0.5 01/17/2015 1454  ? BASOSABS 0.0 01/17/2015 1454  ? ? ?BMET ?   ?Component Value Date/Time  ? NA 136 03/12/2021 0427  ? NA 142 01/17/2015 1454  ? K 3.1 (L) 03/12/2021 0427  ? K 4.2 01/17/2015 1454  ? CL 96 (L) 03/12/2021 0427  ? CO2 27 03/12/2021 0427  ? CO2 28 01/17/2015 1454  ? GLUCOSE 110 (H) 03/12/2021 0427  ? GLUCOSE 96 01/17/2015 1454  ? BUN 16 03/12/2021 0427  ? BUN 46.9 (H) 01/17/2015 1454  ? CREATININE 4.75 (H) 03/12/2021 0427  ? CREATININE 9.9 (HH) 01/17/2015 1454  ? CALCIUM 8.8 (L) 03/12/2021 0427  ? CALCIUM 10.2 01/17/2015 1454  ? GFRNONAA 13 (L) 03/12/2021 0427  ? GFRAA 5 (L) 03/03/2018 2303  ? ? ?INR ?   ?Component Value Date/Time  ? INR 1.0 10/08/2013 1353  ? ? ? ?Intake/Output Summary (Last 24 hours) at 03/12/2021 0834 ?Last data filed at 03/12/2021 0630 ?Gross per 24 hour  ?Intake 1155.51 ml  ?Output 2000 ml  ?Net -844.49 ml  ? ? ? ?Assessment/Plan:  61 y.o. male is s/p L arm fistula revision with intraoperative hypotension 2 Days Post-Op  ? ?Extubated; alert and oriented this morning ?Patent fistula with  palpable thrill ?Ok to use fistula for HD; avoid  staple line ? ? ? ?Dagoberto Ligas, PA-C ?[facility] ?[phone] ?03/12/2021 ?8:34 AM ? ?VASCULAR STAFF ADDENDUM: ?I have independently interviewed and examined the patient. ?I agree with the above.  ? ? ?J. Melene Muller, MD ?[facility] ?Office Phone Number: [phone] ?03/12/2021 9:00 AM ? ? ? ?

## 2021-03-13 ENCOUNTER — Other Ambulatory Visit (HOSPITAL_COMMUNITY): Payer: Self-pay

## 2021-03-13 ENCOUNTER — Encounter (HOSPITAL_COMMUNITY): Payer: Self-pay | Admitting: Vascular Surgery

## 2021-03-13 ENCOUNTER — Inpatient Hospital Stay (HOSPITAL_COMMUNITY): Payer: Medicare Other

## 2021-03-13 DIAGNOSIS — I9589 Other hypotension: Secondary | ICD-10-CM

## 2021-03-13 LAB — ECHOCARDIOGRAM LIMITED
Calc EF: 43.5 %
Height: 69 in
MV M vel: 3.79 m/s
MV Peak grad: 57.5 mmHg
Radius: 0.3 cm
S' Lateral: 4 cm
Single Plane A2C EF: 49 %
Single Plane A4C EF: 36 %
Weight: 2804.25 oz

## 2021-03-13 LAB — BASIC METABOLIC PANEL
Anion gap: 11 (ref 5–15)
BUN: 31 mg/dL — ABNORMAL HIGH (ref 6–20)
CO2: 24 mmol/L (ref 22–32)
Calcium: 9.1 mg/dL (ref 8.9–10.3)
Chloride: 100 mmol/L (ref 98–111)
Creatinine, Ser: 7.56 mg/dL — ABNORMAL HIGH (ref 0.61–1.24)
GFR, Estimated: 8 mL/min — ABNORMAL LOW (ref >60)
Glucose, Bld: 120 mg/dL — ABNORMAL HIGH (ref 70–99)
Potassium: 4.6 mmol/L (ref 3.5–5.1)
Sodium: 135 mmol/L (ref 135–145)

## 2021-03-13 LAB — CBC
HCT: 43.9 % (ref 39.0–52.0)
Hemoglobin: 13.7 g/dL (ref 13.0–17.0)
MCH: 29.9 pg (ref 26.0–34.0)
MCHC: 31.2 g/dL (ref 30.0–36.0)
MCV: 95.9 fL (ref 80.0–100.0)
Platelets: 60 10*3/uL — ABNORMAL LOW (ref 150–400)
RBC: 4.58 MIL/uL (ref 4.22–5.81)
RDW: 16.1 % — ABNORMAL HIGH (ref 11.5–15.5)
WBC: 5.1 10*3/uL (ref 4.0–10.5)
nRBC: 0 % (ref 0.0–0.2)

## 2021-03-13 LAB — HEPATITIS B SURFACE ANTIBODY, QUANTITATIVE: Hep B S AB Quant (Post): >1000 m[IU]/mL (ref >9.9)

## 2021-03-13 LAB — HEPATITIS B E ANTIBODY: Hep B E Ab: NEGATIVE

## 2021-03-13 MED ORDER — ENOXAPARIN SODIUM 80 MG/0.8ML IJ SOSY
80.0000 mg | PREFILLED_SYRINGE | Freq: Once | INTRAMUSCULAR | Status: AC
  Administered 2021-03-13: 80 mg via SUBCUTANEOUS
  Filled 2021-03-13: qty 0.8

## 2021-03-13 MED ORDER — HEPARIN (PORCINE) 25000 UT/250ML-% IV SOLN
1150.0000 [IU]/h | INTRAVENOUS | Status: AC
  Administered 2021-03-13: 1150 [IU]/h via INTRAVENOUS
  Filled 2021-03-13: qty 250

## 2021-03-13 NOTE — TOC Benefit Eligibility Note (Signed)
Patient Advocate Encounter ? ?Insurance verification completed.   ? ?The patient is currently admitted and upon discharge could be taking Eliquis 2.'5mg'$ . ? ?The current 30 day co-pay is $75.  ? ?The patient is insured through [employer].  ? ?Sharlette Dense, CPhT ?Pharmacy Patient Advocate Specialist ?[place] Patient Advocate Team ?Direct Number: [phone]  Fax: [phone]  ?

## 2021-03-13 NOTE — Progress Notes (Signed)
Admit: 03/10/2021 ?LOS: 3 ? ?29M ESRD THS with acute cardiac/pulm event perioperative of LUE AVF plication/revision.   ? ?Subjective:  ?HD overnight, 2L, used AVF ?Extubated, conversant today, no c/o  ? ?03/05 0701 - 03/06 0700 ?In: 1560.8 [P.O.:440; I.V.:1120.8] ?Out: -  ? ?Filed Weights  ? 03/09/21 1418 03/10/21 1038 03/12/21 0215  ?Weight: 79.4 kg 81.6 kg 79.5 kg  ? ? ?Scheduled Meds: ? Chlorhexidine Gluconate Cloth  6 each Topical Daily  ? docusate sodium  100 mg Oral BID  ? [START ON 03/14/2021] doxercalciferol  4 mcg Intravenous Q T,Th,Sa-HD  ? fluticasone furoate-vilanterol  1 puff Inhalation Daily  ? midodrine  10 mg Per Tube TID WC  ? montelukast  10 mg Oral QHS  ? ?Continuous Infusions: ? sodium chloride    ? sodium chloride    ? sodium chloride    ? heparin 1,150 Units/hr (03/13/21 1113)  ? phenylephrine (NEO-SYNEPHRINE) Adult infusion Stopped (03/13/21 1230)  ? ?PRN Meds:.sodium chloride, sodium chloride, acetaminophen, oxyCODONE ? ?Current Labs: reviewed  ? ? ?Physical Exam:  Blood pressure 140/68, pulse (!) 55, temperature 98.1 ?F (36.7 ?C), temperature source Oral, resp. rate 16, height '5\' 9"'$  (1.753 m), weight 79.5 kg, SpO2 94 %. ?GEN: awake, conversant, following commands ?ENT: NCAT, ?EYES: EOMI ?CV: RRR, nl s1s2 ?PULM: CTAB ?ABD: s/nt/nd ?SKIN: LUE AVF with about 2-3in of staples ?EXT:No LEE ? ?OP HD: AF TTS ? 4h 66mn  76kg  2/2 bath  LUE AVF  15g x 2  450/500  Hep none ? - hectorol 4 ug IV tiw ? - no esa or IV Fe  ? ? ? ? ?Assessment/ Plan: ?ESRD - usual HD is TTS.  We used AVF 3/04 postop and it worked well. Cont HD using 16g cannulate away from staples, 2K, 3.5h, 1-2L UF, no heparin. Next HD tomorrow. ?S/p plication/ revision of LUE AVF 03/10/21 ?Hypotensive / apneic event - in perioperative setting, improved ?HFrEF ?CKD-BMM: cont hectorol, binder as able ?Anemia: not current issue ?HTN/Vol: off pressors ? ? ?RKelly Splinter MD ?03/13/2021, 1:34 PM ? ? ? ? ? ?Recent Labs  ?Lab 03/11/21 ?0400 03/12/21 ?0427  03/13/21 ?0316  ?NA 139 136 135  ?K 5.5* 3.1* 4.6  ?CL 97* 96* 100  ?CO2 '26 27 24  '$ ?GLUCOSE 79 110* 120*  ?BUN 34* 16 31*  ?CREATININE 7.92* 4.75* 7.56*  ?CALCIUM 9.6 8.8* 9.1  ? ? ?Recent Labs  ?Lab 03/11/21 ?0400 03/12/21 ?0427 03/13/21 ?0316  ?WBC 5.4 5.0 5.1  ?HGB 13.9 14.2 13.7  ?HCT 44.0 44.7 43.9  ?MCV 94.6 95.7 95.9  ?PLT 75* 56* 60*  ? ? ? ? ? ? ? ? ? ? ?  ?

## 2021-03-13 NOTE — Progress Notes (Signed)
? ?NAME:  SARP VERNIER, MRN:  [id number], DOB:  12-Aug-1960, LOS: 3 ?ADMISSION DATE:  03/10/2021, CONSULTATION DATE:  3/3 ?REFERRING MD:  Donzetta Matters, CHIEF COMPLAINT:  vent weaning and critical care support  ? ?History of Present Illness:  ?61 year old male w/ sig PMH as outlined below most significantly ESRD and Left arm AVF which has required multiple plications in past. Presented to the OR for planned revision of fistula due to recent area of denuded skin and oozing blood after scab removed.  ?Was brought to OR. Was getting Propofol for sedation. Shortly after administration BP rapidly declined w/ SBP initially in 150s to < 100 and pt was no longer spontaneously breathing. Received: calcium, vasopressin and neo  stick. Intubated. Maintained on neo gtt and transferred to ICU  ? ?Pertinent  Medical History  ?ESRD (MWF), asthma, CHF, difficulty waking up from anesthesia in past, COPD, type II DM, esophageal varices, prior stroke w/ left sided deficits, HTN, HFrEF (40-45%), MR, memory loss, prior stroke, MR, tobacco abuse  ? ?Significant Hospital Events: ?Including procedures, antibiotic start and stop dates in addition to other pertinent events   ?3/3 admitted to ICU from OR after precipitous hypotension following propofol sedation complicated further by respiratory failure requiring intubation.  Revision was completed of AVG ?3/4 extubated, iHD ?3/5 remains on low dose neo, on [place] 6LPM ? ?Interim History / Subjective:  ?Remains on Neo 70 ?No complaints ?Down to 3L [place] ?Remains in afib, rate controlled ? ?Objective   ?Blood pressure 140/68, pulse (!) 55, temperature 98.1 ?F (36.7 ?C), temperature source Oral, resp. rate 16, height '5\' 9"'$  (1.753 m), weight 79.5 kg, SpO2 98 %. ?   ?   ? ?Intake/Output Summary (Last 24 hours) at 03/13/2021 0814 ?Last data filed at 03/13/2021 0086 ?Gross per 24 hour  ?Intake 1558.17 ml  ?Output --  ?Net 1558.17 ml  ? ?Filed Weights  ? 03/09/21 1418 03/10/21 1038 03/12/21 0215  ?Weight: 79.4 kg 81.6  kg 79.5 kg  ? ? ?Examination: ?General:  Adult male, chronically ill appearing sitting in bedside recliner napping ?HEENT: MM pink/moist, pupils 4/reactive ?Neuro:  easily arouses, oriented x 3, MAE ?CV: irir, freq PVCs ?PULM:  non labored, posterior bibasilar rales, on 3L, no sputum production ?GI: soft, bs+, NT/ ND, anuric, reports several Bms since admit ?Extremities: warm/dry, LUE AVF +b/t with bandage, trace LE edema ? ?afebrile ?Labs reviewed: K 4.6, normal wBC, plts 56> 60 ? ? ?Resolved Hospital Problem list   ? ? ?Assessment & Plan:  ?Periop near-arrest.  Question related to fragile baseline cardiopulmonary status.  Improving rapidly. ?Postop shock- again think this is just related to acute on chronic cardiorenal vasoplegia ?Postop vent management ?Multiple chronic medical issues ?DM2- his A1c is 5 ?Thrombocytopenia ?Deconditioning ?Afib - new  ? ?- ok to use LUE AVF, sticking away from incision per VVS ?- Hopefully will wean off Neo today.  Assess cuff vs arterial pressure.  Maps have been variable.  Continue midodrine '10mg'$  TID.  Patient reports lower outpt BP's lately, with SBP in the 80's and asymptomatic.  May need to accept lower MAP, possibly > 55 as long as he remains asymptomatic.   ?- looking for other causes of hypotension- consider AI/ stress dose steroids if BP accurate but not given etomidate this admission.  New posterior bibasilar rales which could be atelectasis but he did have new patchy infiltrates on CXR 3/3.  However, he remains afebrile, unchanged WBC, no clinical symptoms or sputum production,  and weaning O2 requirements go against PNA, possibly more fluid related as he remains above his EDW at 76kg, today he remains at 33.5.  next iHD scheduled for 3/7 on his TTS schedule.  Will hold on abx for now.  ?- remains in Afib and has been rate controlled, which appears to be new diagnosis.  CHA2DS2-VASc score of 5.  He dose have hx of lower GI bleeding in 2010 and documented esophageal  varices although can not find documentation of this.  He also is chronically thrombocytopenic.  Will start heparin gtt, no bolus and monitor closely.  If no drop in Hgb, will need DOAC, likely eliquis  ?- keep Mag> 2, K > 4 ?- continue to wean supplemental O2 as he is not on home O2 ?- PT/OT/ IS, mobilize  ? ? ?Best Practice (right click and "Reselect all SmartList Selections" daily)  ? ?Diet/type: renal ?DVT prophylaxis: SCD with chronic thrombocytopenia ?GI prophylaxis: PPI ?Lines: N/A, radial Aline ?Foley:  N/A ?Code Status:  full code ?Last date of multidisciplinary goals of care discussion [full scope ] ? ? ?CCT:  30 mins ? ? ? ? ?Kennieth Rad, ACNP ?[facility] ?03/13/2021, 8:15 AM ? ?See Amion for pager ?If no response to pager, please call PCCM consult pager ?After 7:00 pm call Elink   ? ?

## 2021-03-13 NOTE — Progress Notes (Signed)
?  Echocardiogram ?2D Echocardiogram has been performed. ? ?Nathaniel White ?03/13/2021, 2:23 PM ?

## 2021-03-13 NOTE — Anesthesia Postprocedure Evaluation (Signed)
Anesthesia Post Note ? ?Patient: [name] ? ?Procedure(s) Performed: LEFT ARM REVISION OF ARTERIOVENOUS FISTULA WITH PLICATION (Left: Arm Upper) ? ?  ? ?Patient location during evaluation: ICU ?Anesthesia Type: General ?Level of consciousness: sedated and patient remains intubated per anesthesia plan ?Pain management: pain level controlled ?Vital Signs Assessment: post-procedure vital signs reviewed and stable ?Respiratory status: patient remains intubated per anesthesia plan ?Cardiovascular status: stable ?Postop Assessment: no apparent nausea or vomiting ?Anesthetic complications: yes (Patient nearly arrested during procedure, appeared to by respiratory related - hypoxic, became bradycardic and hypotensive. Pulses never lost. Intubated, pressure supported with vasopressors. Transferred to ICU for further care given these events) ? ? ?No notable events documented. ? ?Last Vitals:  ?Vitals:  ? 03/13/21 0823 03/13/21 0915  ?BP:    ?Pulse:    ?Resp:    ?Temp:    ?SpO2: 98% 99%  ?  ?Last Pain:  ?Vitals:  ? 03/13/21 0715  ?TempSrc:   ?PainSc: 0-No pain  ? ? ?  ?  ?  ?  ?  ?  ? ?Audry Pili ? ? ? ? ?

## 2021-03-13 NOTE — Progress Notes (Signed)
Pt admitted to rm 11 from [place].  CHG wipe given. Initiated tele. VSS. Call bell within reach.  ? ?Lavenia Atlas, RN ? ?

## 2021-03-13 NOTE — Evaluation (Signed)
Occupational Therapy Evaluation ?Patient Details ?Name: Nathaniel White ?MRN: [id number] ?DOB: 07/02/1960 ?Today's Date: 03/13/2021 ? ? ?History of Present Illness Patient is a 61 y/o male who presents on 03/10/21 for revision of the LUE fistula. Procedure complicated by near-code event requiring pressor initiation and intubated 03/10/21-03/11/21. PMH includes cardiomyopathy, ESRD on T/TH/S, asthma, stroke, CHF, COPD, DM, HTN, PVD, tobacco abuse.  ? ?Clinical Impression ?  ?PTA, pt was living with friends and was independent with ADLs and light IADLs; using rollator for mobility. Pt currently requiring Min A for UB ADLs, Mod A for LB ADLs, and Min A +2 for functional mobility. Pt presenting with decreased strength, balance, and activity tolerance. Pt would benefit from further acute OT to facilitate safe dc. Recommend dc to home with HHOT for further OT to optimize safety, independence with ADLs, and return to PLOF.  ?   ? ?Recommendations for follow up therapy are one component of a multi-disciplinary discharge planning process, led by the attending physician.  Recommendations may be updated based on patient status, additional functional criteria and insurance authorization.  ? ?Follow Up Recommendations ? Home health OT  ?  ?Assistance Recommended at Discharge Frequent or constant Supervision/Assistance  ?Patient can return home with the following A little help with walking and/or transfers;A little help with bathing/dressing/bathroom ? ?  ?Functional Status Assessment ? Patient has had a recent decline in their functional status and demonstrates the ability to make significant improvements in function in a reasonable and predictable amount of time.  ?Equipment Recommendations ? None recommended by OT  ?  ?Recommendations for Other Services PT consult ? ? ?  ?Precautions / Restrictions Precautions ?Precautions: Fall;Other (comment) ?Precaution Comments: Watch vitals  ? ?  ? ?Mobility Bed Mobility ?Overal bed mobility: Needs  Assistance ?  ?  ?  ?  ?  ?  ?General bed mobility comments: Sitting in recliner upon arrival ?  ? ?Transfers ?Overall transfer level: Needs assistance ?Equipment used: Rolling walker (2 wheels) ?Transfers: Sit to/from Stand ?Sit to Stand: Min assist ?  ?  ?  ?  ?  ?General transfer comment: MIn A for initating power up. MIn Guard A for safety in returning to seated ?  ? ?  ?Balance Overall balance assessment: Needs assistance ?Sitting-balance support: No upper extremity supported, Feet supported ?Sitting balance-Leahy Scale: Fair ?  ?  ?Standing balance support: During functional activity, Bilateral upper extremity supported ?Standing balance-Leahy Scale: Poor ?  ?  ?  ?  ?  ?  ?  ?  ?  ?  ?  ?  ?   ? ?ADL either performed or assessed with clinical judgement  ? ?ADL Overall ADL's : Needs assistance/impaired ?Eating/Feeding: Set up;Sitting ?  ?Grooming: Minimal assistance;Sitting ?  ?Upper Body Bathing: Minimal assistance;Sitting ?  ?Lower Body Bathing: Moderate assistance;Sit to/from stand ?  ?Upper Body Dressing : Minimal assistance;Sitting ?  ?Lower Body Dressing: Moderate assistance;Sit to/from stand ?  ?Toilet Transfer: Minimal assistance;Ambulation;Rolling walker (2 wheels) ?Toilet Transfer Details (indicate cue type and reason): Min A for initiating power up ?  ?  ?  ?  ?Functional mobility during ADLs: Minimal assistance;Rolling walker (2 wheels) ?General ADL Comments: Pt currently requiring increased time and demonstrating decreased balance and strength  ? ? ? ?Vision   ?   ?   ?Perception   ?  ?Praxis   ?  ? ?Pertinent Vitals/Pain Pain Assessment ?Pain Assessment: Faces ?Faces Pain Scale: No hurt ?Pain Intervention(s): Monitored during session  ? ? ? ?  Hand Dominance Left ?  ?Extremity/Trunk Assessment Upper Extremity Assessment ?Upper Extremity Assessment: LUE deficits/detail ?LUE Deficits / Details: L hemiparesis from prior CVA. Limited digit ROM and elbow ROM. Pt also reporting pain after his fistula  repair. ?LUE Coordination: decreased gross motor;decreased fine motor ?  ?Lower Extremity Assessment ?Lower Extremity Assessment: Defer to PT evaluation ?  ?Cervical / Trunk Assessment ?Cervical / Trunk Assessment: Kyphotic ?  ?Communication   ?  ?Cognition Arousal/Alertness: Awake/alert ?Behavior During Therapy: [facility] for tasks assessed/performed ?Overall Cognitive Status: History of cognitive impairments - at baseline ?  ?  ?  ?  ?  ?  ?  ?  ?  ?  ?  ?  ?  ?  ?  ?  ?General Comments: ST memory deficits at baseline after CVA. Pt engaging in conversation and following simple commands. ?  ?  ?General Comments  SpO2 95% on RA. HR 70s. RR 10-20s. BP 127/108 (114) ? ?  ?Exercises   ?  ?Shoulder Instructions    ? ? ?Home Living Family/patient expects to be discharged to:: Private residence ?Living Arrangements: Non-relatives/Friends (2 girls) ?Available Help at Discharge: Friend(s) ?Type of Home: House ?Home Access: Stairs to enter ?Entrance Stairs-Number of Steps: 2 ?  ?Home Layout: Two level;Able to live on main level with bedroom/bathroom ?Alternate Level Stairs-Number of Steps: flight ?Alternate Level Stairs-Rails: Right;Left ?Bathroom Shower/Tub: Tub/shower unit;Curtain ?  ?Bathroom Toilet: Standard ?  ?  ?Home Equipment: BSC/3in1;Rollator (4 wheels);Shower seat;Cane - single point;Wheelchair - manual ?  ?  ?  ? ?  ?Prior Functioning/Environment Prior Level of Function : Needs assist ?  ?  ?  ?  ?  ?  ?Mobility Comments: uses rollator for mobility. longest distance is to/from his car ?ADLs Comments: Does little cooking/cleaning., drives. ?  ? ?  ?  ?OT Problem List: Decreased strength;Decreased range of motion;Decreased activity tolerance;Impaired vision/perception ?  ?   ?OT Treatment/Interventions: Self-care/ADL training;Therapeutic exercise;Energy conservation;Therapeutic activities;Patient/family education  ?  ?OT Goals(Current goals can be found in the care plan section) Acute Rehab OT Goals ?Patient Stated Goal:  Go home ?OT Goal Formulation: With patient ?Time For Goal Achievement: 03/27/21 ?Potential to Achieve Goals: Good  ?OT Frequency: Min 2X/week ?  ? ?Co-evaluation PT/OT/SLP Co-Evaluation/Treatment: Yes ?Reason for Co-Treatment: To address functional/ADL transfers;For patient/therapist safety ?  ?OT goals addressed during session: ADL's and self-care ?  ? ?  ?AM-PAC OT "6 Clicks" Daily Activity     ?Outcome Measure Help from another person eating meals?: A Little ?Help from another person taking care of personal grooming?: A Little ?Help from another person toileting, which includes using toliet, bedpan, or urinal?: A Little ?Help from another person bathing (including washing, rinsing, drying)?: A Lot ?Help from another person to put on and taking off regular upper body clothing?: A Little ?Help from another person to put on and taking off regular lower body clothing?: A Lot ?6 Click Score: 16 ?  ?End of Session Equipment Utilized During Treatment: Rolling walker (2 wheels) ?Nurse Communication: Mobility status ? ?Activity Tolerance: Patient tolerated treatment well ?Patient left: in chair;with call bell/phone within reach ? ?OT Visit Diagnosis: Unsteadiness on feet (R26.81);Other abnormalities of gait and mobility (R26.89);Muscle weakness (generalized) (M62.81)  ?              ?Time: 8756-4332 ?OT Time Calculation (min): 21 min ?Charges:  OT General Charges ?$OT Visit: 1 Visit ?OT Evaluation ?$OT Eval Moderate Complexity: 1 Mod ?Allahna Husband MSOT, OTR/L ?Acute Rehab ?  Pager: [phone] ?Office: [phone] ? ?Elijha Dedman M Alix Lahmann ?03/13/2021, 10:57 AM ?

## 2021-03-13 NOTE — Progress Notes (Addendum)
ANTICOAGULATION CONSULT NOTE ? ?Pharmacy Consult for heparin >> Lovenox ?Indication: atrial fibrillation ? ?Allergies  ?Allergen Reactions  ? Codeine Shortness Of Breath  ? Dextromethorphan-Guaifenesin Shortness Of Breath  ? Iron Dextran Shortness Of Breath  ? Morphine And Related Shortness Of Breath  ? ? ?Patient Measurements: ?Height: '5\' 9"'$  (175.3 cm) ?Weight: 79.5 kg (175 lb 4.3 oz) ?IBW/kg (Calculated) : 70.7 ?Heparin Dosing Weight: 81.6 kg ? ?Vital Signs: ?Temp: 98.7 ?F (37.1 ?C) (03/06 1915) ?Temp Source: Oral (03/06 1915) ?BP: 129/53 (03/06 1915) ?Pulse Rate: 68 (03/06 1915) ? ?Labs: ?Recent Labs  ?  03/11/21 ?0400 03/12/21 ?0427 03/13/21 ?0316  ?HGB 13.9 14.2 13.7  ?HCT 44.0 44.7 43.9  ?PLT 75* 56* 60*  ?CREATININE 7.92* 4.75* 7.56*  ? ? ? ?Estimated Creatinine Clearance: 10.7 mL/min (A) (by C-G formula based on SCr of 7.56 mg/dL (H)). ? ? ?Assessment: ?45 yom who presented to OR after planned revision of fistula due to recent area of denuded skin and oozing blood after scab removed. Has hx of ESRD, stroke, and thrombocytopenia. Patient has new Afib and was started on IV heparin for safety concern.  Phlebotomist attempted and was unable to get a heparin level due to restricted access and lower extremity swelling.  Lovenox is not ideal due to longer half-life and ESRD; however, options are limited.   ? ?Goal of Therapy:  ?Anti-Xa level 0.6-1 units/ml 4hrs after LMWH dose given ?Monitor platelets by anticoagulation protocol: Yes ?  ?Plan:  ?Discussed with CCM regarding risk and benefit, stop IV heparin ?Lovenox '80mg'$  SQ x 1 when heparin is discontinued (RN aware) ?Monitor tolerance, s/sx of bleeding before scheduling med ?Check LMWH level if to continue on daily Lovenox ? ?Geroge Gilliam D. Mina Marble, PharmD, BCPS, BCCCP ?03/13/2021, 8:39 PM ? ? ? ?

## 2021-03-13 NOTE — Evaluation (Signed)
Physical Therapy Evaluation ?Patient Details ?Name: Nathaniel White ?MRN: [id number] ?DOB: 28-May-1960 ?Today's Date: 03/13/2021 ? ?History of Present Illness ? Patient is a 61 y/o male who presents on 03/10/21 for revision of the LUE fistula. Procedure complicated by near-code event requiring pressor initiation and intubated 03/10/21-03/11/21. PMH includes cardiomyopathy, ESRD on T/TH/S, asthma, stroke, CHF, COPD, DM, HTN, PVD, tobacco abuse.  ?Clinical Impression ? Patient presents with baseline left hemiparesis/cognitive deficits, decreased activity tolerance, impaired balance and impaired mobility s/p above. Pt lives at home with "2 girls" who provide support/assist as needed per pt report. Pt cares for self and uses rollator for ambulation; drives. Today, pt requires Min A for standing and Min guard assist for short distance ambulation. Fatigues with activity. Reports feeling not at baseline. Will follow acutely to maximize independence and mobility prior to return home. ?   ? ?Recommendations for follow up therapy are one component of a multi-disciplinary discharge planning process, led by the attending physician.  Recommendations may be updated based on patient status, additional functional criteria and insurance authorization. ? ?Follow Up Recommendations Home health PT ? ?  ?Assistance Recommended at Discharge Intermittent Supervision/Assistance  ?Patient can return home with the following ? A little help with walking and/or transfers;A little help with bathing/dressing/bathroom;Assistance with cooking/housework;Assist for transportation;Help with stairs or ramp for entrance ? ?  ?Equipment Recommendations None recommended by PT  ?Recommendations for Other Services ?    ?  ?Functional Status Assessment Patient has had a recent decline in their functional status and demonstrates the ability to make significant improvements in function in a reasonable and predictable amount of time.  ? ?  ?Precautions / Restrictions  Precautions ?Precautions: Fall;Other (comment) ?Precaution Comments: Watch vitals ?Restrictions ?Weight Bearing Restrictions: No  ? ?  ? ?Mobility ? Bed Mobility ?  ?  ?  ?  ?  ?  ?  ?General bed mobility comments: Sitting in recliner upon arrival ?  ? ?Transfers ?Overall transfer level: Needs assistance ?Equipment used: Rolling walker (2 wheels) ?Transfers: Sit to/from Stand ?Sit to Stand: Min assist ?  ?  ?  ?  ?  ?General transfer comment: MIn A for initating power up. MIn Guard A for safety in returning to seated. Increased time and difficulty. Not able to functionally use LUE for standing. ?  ? ?Ambulation/Gait ?Ambulation/Gait assistance: Min guard ?Gait Distance (Feet): 50 Feet ?Assistive device: Rolling walker (2 wheels) ?Gait Pattern/deviations: Step-through pattern, Step-to pattern, Decreased step length - left, Decreased dorsiflexion - left, Steppage, Trunk flexed ?Gait velocity: decreased ?Gait velocity interpretation: <1.31 ft/sec, indicative of household ambulator ?  ?General Gait Details: Slow, hemiparetic type gait with decreased step length and foot clearance LLE. Reports feeling weaker than baseline, flexed trunk. ? ?Stairs ?  ?  ?  ?  ?  ? ?Wheelchair Mobility ?  ? ?Modified Rankin (Stroke Patients Only) ?  ? ?  ? ?Balance Overall balance assessment: Needs assistance ?Sitting-balance support: Feet supported, No upper extremity supported ?Sitting balance-Leahy Scale: Fair ?  ?  ?Standing balance support: During functional activity, Reliant on assistive device for balance ?Standing balance-Leahy Scale: Poor ?  ?  ?  ?  ?  ?  ?  ?  ?  ?  ?  ?  ?   ? ? ? ?Pertinent Vitals/Pain Pain Assessment ?Pain Assessment: Faces ?Faces Pain Scale: No hurt  ? ? ?Home Living Family/patient expects to be discharged to:: Private residence ?Living Arrangements: Non-relatives/Friends ?Available Help at Discharge: Friend(s) ?  Type of Home: House ?Home Access: Stairs to enter ?  ?Entrance Stairs-Number of Steps:  2 ?Alternate Level Stairs-Number of Steps: flight ?Home Layout: Two level;Able to live on main level with bedroom/bathroom ?Home Equipment: BSC/3in1;Rollator (4 wheels);Shower seat;Cane - single point;Wheelchair - manual ?   ?  ?Prior Function Prior Level of Function : Needs assist ?  ?  ?  ?  ?  ?  ?Mobility Comments: uses rollator for mobility. longest distance is to/from his car ?ADLs Comments: Does little cooking/cleaning., drives. ?  ? ? ?Hand Dominance  ? Dominant Hand: Left ? ?  ?Extremity/Trunk Assessment  ? Upper Extremity Assessment ?Upper Extremity Assessment: Defer to OT evaluation ?LUE Deficits / Details: L hemiparesis from prior CVA. Limited digit ROM and elbow ROM. Pt also reporting pain after his fistula repair. ?LUE Coordination: decreased gross motor;decreased fine motor ?  ? ?Lower Extremity Assessment ?Lower Extremity Assessment: Generalized weakness;LLE deficits/detail ?LLE Deficits / Details: Residual deficits from CVA- left hemiparesis, decreased ankle/knee AROM. ?  ? ?Cervical / Trunk Assessment ?Cervical / Trunk Assessment: Kyphotic  ?Communication  ?    ?Cognition Arousal/Alertness: Awake/alert ?Behavior During Therapy: [facility] for tasks assessed/performed ?Overall Cognitive Status: History of cognitive impairments - at baseline ?  ?  ?  ?  ?  ?  ?  ?  ?  ?  ?  ?  ?  ?  ?  ?  ?General Comments: ST memory deficits at baseline after CVA. Pt engaging in conversation and following simple commands. ?  ?  ? ?  ?General Comments General comments (skin integrity, edema, etc.): Sp02 95% on RA, HR 70s. BP elevated 127/108. ? ?  ?Exercises    ? ?Assessment/Plan  ?  ?PT Assessment Patient needs continued PT services  ?PT Problem List Decreased range of motion;Decreased strength;Decreased mobility;Decreased safety awareness;Cardiopulmonary status limiting activity;Decreased cognition;Decreased activity tolerance;Decreased balance;Decreased knowledge of use of DME ? ?   ?  ?PT Treatment Interventions  Therapeutic exercise;Gait training;Balance training;Patient/family education;Therapeutic activities;Functional mobility training;Neuromuscular re-education;Stair training   ? ?PT Goals (Current goals can be found in the Care Plan section)  ?Acute Rehab PT Goals ?Patient Stated Goal: to go home and be able to get to my car ?PT Goal Formulation: With patient ?Time For Goal Achievement: 03/27/21 ?Potential to Achieve Goals: Good ? ?  ?Frequency Min 3X/week ?  ? ? ?Co-evaluation   ?Reason for Co-Treatment: To address functional/ADL transfers;For patient/therapist safety ?  ?OT goals addressed during session: ADL's and self-care ?  ? ? ?  ?AM-PAC PT "6 Clicks" Mobility  ?Outcome Measure Help needed turning from your back to your side while in a flat bed without using bedrails?: A Little ?Help needed moving from lying on your back to sitting on the side of a flat bed without using bedrails?: A Little ?Help needed moving to and from a bed to a chair (including a wheelchair)?: A Little ?Help needed standing up from a chair using your arms (e.g., wheelchair or bedside chair)?: A Little ?Help needed to walk in hospital room?: A Little ?Help needed climbing 3-5 steps with a railing? : A Lot ?6 Click Score: 17 ? ?  ?End of Session Equipment Utilized During Treatment: Gait belt ?Activity Tolerance: Patient limited by fatigue;Patient tolerated treatment well ?Patient left: in chair;with call bell/phone within reach;Other (comment) (MD present) ?Nurse Communication: Mobility status ?PT Visit Diagnosis: Muscle weakness (generalized) (M62.81);Difficulty in walking, not elsewhere classified (R26.2) ?  ? ?Time: 6295-2841 ?PT Time Calculation (min) (ACUTE ONLY):  25 min ? ? ?Charges:   PT Evaluation ?$PT Eval Moderate Complexity: 1 Mod ?  ?  ?   ? ? ?Marisa Severin, PT, DPT ?Acute Rehabilitation Services ?Pager [phone] ?Office [phone] ? ? ? ? ?[place]erna ?03/13/2021, 12:25 PM ? ?

## 2021-03-13 NOTE — Progress Notes (Addendum)
[facility] [place] ? ?Subjective  - Sitting up eating this am ? ? ?Objective ?140/68 ?(!) 55 ?98.1 ?F (36.7 ?C) (Oral) ?16 ?98% ? ?Intake/Output Summary (Last 24 hours) at 03/13/2021 0743 ?Last data filed at 03/13/2021 7591 ?Gross per 24 hour  ?Intake 1810.75 ml  ?Output --  ?Net 1810.75 ml  ? ? ?HR irregularly irregular A fib ?Hypotension requiring Neo ?O2 SAT 98% on 3L via [employer], non labored breathing ?A & O fearful after events ?Palpable thrill in left UE fistula ? ? ?Assessment/Planning: ?61 y.o. male is s/p L arm fistula revision with intraoperative hypotension 3 Days Post-Op  ? ?Good thrill in fistula and worked on HD, do not stick new incision area for 4 weeks ?Working on pressor needs and O2 with the help of CCM ?A & O sitting up in chair ? ?Roxy Horseman ?03/13/2021 ?7:43 AM ?-- ? ?Laboratory ?Lab Results: ?Recent Labs  ?  03/12/21 ?6384 03/13/21 ?0316  ?WBC 5.0 5.1  ?HGB 14.2 13.7  ?HCT 44.7 43.9  ?PLT 56* 60*  ? ?BMET ?Recent Labs  ?  03/12/21 ?0427 03/13/21 ?0316  ?NA 136 135  ?K 3.1* 4.6  ?CL 96* 100  ?CO2 27 24  ?GLUCOSE 110* 120*  ?BUN 16 31*  ?CREATININE 4.75* 7.56*  ?CALCIUM 8.8* 9.1  ? ? ?COAG ?Lab Results  ?Component Value Date  ? INR 1.0 10/08/2013  ? INR 1.02 08/08/2011  ? INR 1.0 02/03/2008  ? ?No results found for: PTT ? ?I have independently interviewed and examined patient and agree with PA assessment and plan above.  ? ?Algie Westry C. Donzetta Matters, MD ?[facility] ?Office: [phone] ?Pager: [phone] ? ? ?

## 2021-03-13 NOTE — Progress Notes (Signed)
ANTICOAGULATION CONSULT NOTE - Initial Consult ? ?Pharmacy Consult for heparin ?Indication: atrial fibrillation ? ?Allergies  ?Allergen Reactions  ? Codeine Shortness Of Breath  ? Other Other (See Comments) and Shortness Of Breath  ? Dextromethorphan-Guaifenesin Other (See Comments)  ? Iron Dextran Other (See Comments)  ? Morphine And Related Other (See Comments)  ? ? ?Patient Measurements: ?Height: '5\' 9"'$  (175.3 cm) ?Weight: 79.5 kg (175 lb 4.3 oz) ?IBW/kg (Calculated) : 70.7 ?Heparin Dosing Weight: 81.6 kg ? ?Vital Signs: ?Temp: 98.1 ?F (36.7 ?C) (03/06 0700) ?Temp Source: Oral (03/06 0700) ?Pulse Rate: 55 (03/06 0615) ? ?Labs: ?Recent Labs  ?  03/10/21 ?1616 03/10/21 ?1712 03/10/21 ?2012 03/10/21 ?2118 03/11/21 ?0400 03/12/21 ?0427 03/13/21 ?0316  ?HGB 13.1   < >  --    < > 13.9 14.2 13.7  ?HCT 40.5   < >  --    < > 44.0 44.7 43.9  ?PLT 69*  --   --   --  75* 56* 60*  ?CREATININE 7.61*  --   --   --  7.92* 4.75* 7.56*  ?TROPONINIHS 76*  --  98*  --   --   --   --   ? < > = values in this interval not displayed.  ? ? ?Estimated Creatinine Clearance: 10.7 mL/min (A) (by C-G formula based on SCr of 7.56 mg/dL (H)). ? ? ?Medical History: ?Past Medical History:  ?Diagnosis Date  ? Anemia   ? Anxiety   ? Asthma   ? CHF (congestive heart failure) ([place])   ? Complication from renal dialysis device 11/02/2013  ? Complication of anesthesia 2017  ? according to pt and spouse pt was moving around and cough while under and pt had difficulty waking up so the anesthesia had to be reversed. and pt admitted to ICU.   ? COPD (chronic obstructive pulmonary disease) ([place])   ? Diabetes mellitus without complication ([place])   ? Type II - Patient states he does not have diabetes, "they said sometimes when you start dialysis you don't have diabetes any longer"   ? ESOPHAGEAL VARICES 10/04/2008  ? Qualifier: Diagnosis of  By: Fuller Plan MD Lamont Snowball T   ? ESRD   ? on HD, T-TH-Sat - [place]  ? Hemiparesis due to old stroke [facility])   ? left  ?  Hypertension   ? Hx, not current problems, no meds  ? LV dysfunction   ? EF 25-30% by echo 07/2011  ? 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 due to stroke  ? MR (mitral regurgitation)   ? moderate to severe, echo 07/2011  ? Peripheral vascular disease ([place])   ? Shortness of breath   ? with exertion  ? Stroke [facility])   ? TIA's-left sided weakness  ? Tobacco abuse   ? ? ?Medications:  ?Scheduled:  ? Chlorhexidine Gluconate Cloth  6 each Topical Daily  ? docusate sodium  100 mg Oral BID  ? [START ON 03/14/2021] doxercalciferol  4 mcg Intravenous Q T,Th,Sa-HD  ? fluticasone furoate-vilanterol  1 puff Inhalation Daily  ? midodrine  10 mg Per Tube TID WC  ? montelukast  10 mg Oral QHS  ? ? ?Assessment: ?29 yom who presented to OR after planned revision of fistula due to recent area of denuded skin and oozing blood after scab removed. Has hx of ESRD, stroke, and thrombocytopenia. No hx AC PTA.  ? ?Hgb 13.7, plt 60. In rate controlled Afib - no mention of  Afib previously (CHADsVASc 5). Discussed with team and will trial heparin infusion and monitor CBC and for s/sx of bleeding before changing to oral option.   ? ?Goal of Therapy:  ?Heparin level 0.3-0.5 units/ml ?Monitor platelets by anticoagulation protocol: Yes ?  ?Plan:  ?Start heparin infusion at 1150 units/hr ?Check anti-Xa level in 8 hours and daily while on heparin ?Continue to monitor H&H and platelets ? ?Antonietta Jewel, PharmD, BCCCP ?Clinical Pharmacist  ?Phone: [phone] ?03/13/2021 9:10 AM ? ?Please check AMION for all [place] phone numbers ?After 10:00 PM, call [place] [phone] ? ? ? ?

## 2021-03-14 DIAGNOSIS — I5023 Acute on chronic systolic (congestive) heart failure: Secondary | ICD-10-CM

## 2021-03-14 DIAGNOSIS — N186 End stage renal disease: Secondary | ICD-10-CM

## 2021-03-14 DIAGNOSIS — I4819 Other persistent atrial fibrillation: Secondary | ICD-10-CM

## 2021-03-14 DIAGNOSIS — Z992 Dependence on renal dialysis: Secondary | ICD-10-CM

## 2021-03-14 LAB — RENAL FUNCTION PANEL
Albumin: 3 g/dL — ABNORMAL LOW (ref 3.5–5.0)
Anion gap: 15 (ref 5–15)
BUN: 47 mg/dL — ABNORMAL HIGH (ref 6–20)
CO2: 23 mmol/L (ref 22–32)
Calcium: 9.3 mg/dL (ref 8.9–10.3)
Chloride: 99 mmol/L (ref 98–111)
Creatinine, Ser: 9.18 mg/dL — ABNORMAL HIGH (ref 0.61–1.24)
GFR, Estimated: 6 mL/min — ABNORMAL LOW (ref >60)
Glucose, Bld: 100 mg/dL — ABNORMAL HIGH (ref 70–99)
Phosphorus: 7 mg/dL — ABNORMAL HIGH (ref 2.5–4.6)
Potassium: 4.3 mmol/L (ref 3.5–5.1)
Sodium: 137 mmol/L (ref 135–145)

## 2021-03-14 LAB — CBC
HCT: 41.8 % (ref 39.0–52.0)
Hemoglobin: 13.5 g/dL (ref 13.0–17.0)
MCH: 30.5 pg (ref 26.0–34.0)
MCHC: 32.3 g/dL (ref 30.0–36.0)
MCV: 94.4 fL (ref 80.0–100.0)
Platelets: 63 10*3/uL — ABNORMAL LOW (ref 150–400)
RBC: 4.43 MIL/uL (ref 4.22–5.81)
RDW: 15.9 % — ABNORMAL HIGH (ref 11.5–15.5)
WBC: 4.6 10*3/uL (ref 4.0–10.5)
nRBC: 0 % (ref 0.0–0.2)

## 2021-03-14 MED ORDER — APIXABAN 2.5 MG PO TABS
2.5000 mg | ORAL_TABLET | Freq: Two times a day (BID) | ORAL | Status: DC
  Administered 2021-03-14 – 2021-03-15 (×2): 2.5 mg via ORAL
  Filled 2021-03-14 (×2): qty 1

## 2021-03-14 NOTE — Consult Note (Addendum)
Cardiology Consultation:   Patient ID: Nathaniel White MRN: [id number]; DOB: [birth year]  Admit date: 03/10/2021 Date of Consult: 03/14/2021  PCP:  Nathaniel Mai, MD   [facility] Providers Cardiologist:  None   {    Patient Profile:   Nathaniel White is a 61 y.o. male with a hx of nonischemic cardiomyopathy with LVEF 30%>40-45%>35%, HTN, HLD, PAD, ESRD on HD, COPD, DMII and tobacco use who is being seen 03/14/2021 for the evaluation of acute on chronic systolic heart failure and AFib at the request of Nathaniel White.  History of Present Illness:   Nathaniel White is a 61 year old male with history of known nonischemic cardiomyopathy with cath in 2015 without obstructive disease who was last seen in clinic by Nathaniel White in 2020 but then was lost to follow-up.   He presented on this admission for planned revision of AV-fistula with post-op course complicated by hypotension and respiratory failure requiring intubation. Was admitted to the ICU and initially placed on pressors now improved and extubated and transferred to the floor. Episode thought to be due to vasoplegia in the setting of underlying ESRD. TTE 03/13/21 with LVEF 35% (from 40-45%), with moderate RV dysfunction, severe PHTN, moderate MR, RAP 9mHg. Also noted to have new Afib.  Currently, the patient states he overall feels better. He states he was unaware of a history of heart failure or Afib. Denies any chest pain, SOB or palpitations.     Past Medical History:  Diagnosis Date   Anemia    Anxiety    Asthma    CHF (congestive heart failure) ([facility]    Complication from renal dialysis device 129/51/8841  Complication of anesthesia 2017   according to pt and spouse pt was moving around and cough while under and pt had difficulty waking up so the anesthesia had to be reversed. and pt admitted to ICU.    COPD (chronic obstructive pulmonary disease) (HCC)    Diabetes mellitus without complication ([facility]    Type II - Patient states he  does not have diabetes, "they said sometimes when you start dialysis you don'White have diabetes any longer"    ESOPHAGEAL VARICES 10/04/2008   Qualifier: Diagnosis of  By: SFuller PlanMD [place] [facility]    ESRD    on HD, [facility]-Sat - Nathaniel White   Hemiparesis due to old stroke ([facility]    left   Hypertension    Hx, not current problems, no meds   LV dysfunction    EF 25-30% by echo 07/2011   Memory loss due to medical condition    due to stroke   MR (mitral regurgitation)    moderate to severe, echo 07/2011   Peripheral vascular disease ([facility]    Shortness of breath    with exertion   Stroke ([facility]    TIA's-left sided weakness   Tobacco abuse     Past Surgical History:  Procedure Laterality Date   ABDOMINAL AORTOGRAM W/LOWER EXTREMITY Bilateral 07/21/2018   Procedure: ABDOMINAL AORTOGRAM W/LOWER EXTREMITY;  Surgeon: CWaynetta Sandy MD;  [facility];  Service: Cardiovascular;  Laterality: Bilateral;   AMPUTATION Left 11/05/2018   Procedure: AMPUTATION SECOND TOE LEFT FOOT;  Surgeon: CWaynetta Sandy MD;  [facility]: [facility]  Service: Vascular;  Laterality: Left;   AMPUTATION Left 02/13/2021   Procedure: Left small finger AMPUTATION DIGIT;  Surgeon: BSherilyn Cooter MD;  [facility]  Service: Orthopedics;  Laterality: Left;   AV FISTULA PLACEMENT  CARDIAC CATHETERIZATION     [facility]   COLONOSCOPY W/ BIOPSIES AND POLYPECTOMY     FISTULA SUPERFICIALIZATION Left 11/10/2013   Procedure: FISTULA PLICATION;  Surgeon: Nathaniel Mitchell, MD;  [facility];  Service: Vascular;  Laterality: Left;   KIDNEY TRANSPLANT     2011 rejected kidney 2012 back on dialysis   LEFT AND RIGHT HEART CATHETERIZATION WITH CORONARY ANGIOGRAM N/A 10/09/2013   Procedure: LEFT AND RIGHT HEART CATHETERIZATION WITH CORONARY ANGIOGRAM;  Surgeon: Nathaniel Blanks, MD;  Location: [facility] C[facility] LAB;  Service: Cardiovascular;  Laterality: N/A;   PERIPHERAL VASCULAR INTERVENTION Left  07/21/2018   Procedure: PERIPHERAL VASCULAR INTERVENTION;  Surgeon: Nathaniel Sandy, MD;  Location: [place] [facility];  Service: Cardiovascular;  Laterality: Left;  Popliteal   REVISON OF ARTERIOVENOUS FISTULA Left 08/26/2013   Procedure: EXCISION OF ERODED SKIN AND EXPLORATION OF MAIN LEFT UPPER ARM AV FISTULA;  Surgeon: Rosetta Posner, MD;  Location: [place];  Service: Vascular;  Laterality: Left;   REVISON OF ARTERIOVENOUS FISTULA Left 02/05/2014   Procedure: REPAIR OF ARTERIOVENOUS FISTULA ANEURYSM;  Surgeon: Nathaniel Mitchell, MD;  Location: [facility] OR;  Service: Vascular;  Laterality: Left;   REVISON OF ARTERIOVENOUS FISTULA Left 03/10/2021   Procedure: LEFT ARM REVISION OF ARTERIOVENOUS FISTULA WITH PLICATION;  Surgeon: Nathaniel Sandy, MD;  Location: [place];  Service: Vascular;  Laterality: Left;   SHUNTOGRAM N/A 11/05/2012   Procedure: Fistulogram;  Surgeon: Nathaniel Mitchell, MD;  Location: [facility] CATH LAB;  Service: Cardiovascular;  Laterality: N/A;   TEE WITHOUT CARDIOVERSION  08/17/2011   Procedure: TRANSESOPHAGEAL ECHOCARDIOGRAM (TEE);  Surgeon: Nathaniel Dresser, MD;  Location: [facility] ENDOSCOPY;  Service: Cardiovascular;  Laterality: N/A;   VIDEO ASSISTED THORACOSCOPY (VATS)/DECORTICATION  08/10/11     Home Medications:  Prior to Admission medications   Medication Sig Start Date End Date Taking? Authorizing Provider  acetaminophen (TYLENOL) 500 MG tablet Take 1 tablet (500 mg total) by mouth every 6 (six) hours as needed. Patient taking differently: Take 500 mg by mouth every 6 (six) hours as needed for mild pain or headache. 04/11/18  Yes Burky, Malachy Moan, NP  albuterol (PROAIR HFA) 108 (90 Base) MCG/ACT inhaler Inhale 2 puffs into the lungs every 6 (six) hours as needed for wheezing or shortness of breath. 01/11/21  Yes Nathaniel Mai, MD  aspirin EC 81 MG tablet Take 81 mg by mouth daily.   Yes [provider]  midodrine (PROAMATINE) 10 MG tablet Take 10 mg by mouth See admin  instructions. Take 10 mg by mouth every Tues/Thurs/Sat BEFORE dialysis 01/17/21  Yes [provider]  VELPHORO 500 MG chewable tablet Chew 1,000 mg by mouth 2 (two) times daily after a meal. 02/06/21  Yes [provider]  VISINE ADVANCED RELIEF 0.05-0.1-1-1 % SOLN Place 1 drop into both eyes 2 (two) times daily as needed (for dryness).   Yes [provider]  fluticasone-salmeterol (ADVAIR DISKUS) 250-50 MCG/ACT AEPB Inhale 1 puff into the lungs in the morning and at bedtime. Patient not taking: Reported on 03/13/2021 07/27/20 05/13/21  Gildardo Pounds, NP  glucose blood test strip  12/26/09   [provider]  montelukast (SINGULAIR) 10 MG tablet Take 1 tablet (10 mg total) by mouth at bedtime. Patient not taking: Reported on 03/13/2021 07/27/20 05/13/21  Gildardo Pounds, NP  oxyCODONE (ROXICODONE) 5 MG immediate release tablet Take 1 tablet (5 mg total) by mouth every 4 (four) hours as needed for severe  pain. Patient not taking: Reported on 03/13/2021 02/13/21   Nathaniel Cooter, MD    Inpatient Medications: Scheduled Meds:  Chlorhexidine Gluconate Cloth  6 each Topical Daily   docusate sodium  100 mg Oral BID   doxercalciferol  4 mcg Intravenous Q White,Th,Sa-HD   fluticasone furoate-vilanterol  1 puff Inhalation Daily   midodrine  10 mg Per Tube TID WC   montelukast  10 mg Oral QHS   Continuous Infusions:  sodium chloride     PRN Meds: acetaminophen, oxyCODONE  Allergies:    Allergies  Allergen Reactions   Codeine Shortness Of Breath   Dextromethorphan-Guaifenesin Shortness Of Breath   Iron Dextran Shortness Of Breath   Morphine And Related Shortness Of Breath    Social History:   Social History   Socioeconomic History   Marital status: Single    Spouse name: Not on file   Number of children: 1   Years of education: Not on file   Highest education level: Not on file  Occupational History   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   Tobacco Use   Smoking status: Former    Packs/day: 0.50    Years: 15.00    Pack years: 7.50    Types: Cigarettes    Quit date: 2014    Years since quitting: 9.1   Smokeless tobacco: Never  Vaping Use   Vaping Use: Never used  Substance and Sexual Activity   Alcohol use: No    Alcohol/week: 0.0 standard drinks   Drug use: No   Sexual activity: Not Currently  Other Topics Concern   Not on file  Social History Narrative   Not on file   Social Determinants of Health   Financial Resource Strain: Not on file  Food Insecurity: Not on file  Transportation Needs: Not on file  Physical Activity: Not on file  Stress: Not on file  Social Connections: Not on file  Intimate Partner Violence: Not on file    Family History:    Family History  Problem Relation Age of Onset   Hypertension Mother    Varicose Veins Mother    Diabetes Paternal Grandmother    Cancer Paternal Grandfather    CAD Paternal Uncle      ROS:  Please see the history of present illness.  Review of Systems  Constitutional:  Negative for fever.  Respiratory:  Positive for cough. Negative for shortness of breath.   Cardiovascular:  Positive for leg swelling. Negative for chest pain, palpitations, orthopnea, claudication and PND.  Gastrointestinal:  Negative for blood in stool and melena.  Genitourinary:  Negative for hematuria.  Neurological:  Positive for dizziness. Negative for loss of consciousness.    Physical Exam/Data:   Vitals:   03/14/21 1030 03/14/21 1100 03/14/21 1135 03/14/21 1605  BP: (!) 98/52 (!) 109/49 (!) 97/41 (!) 100/58  Pulse: 71 69 76 78  Resp:  '16 19 19  '$ Temp:  (!) 97.5 F (36.4 C) 97.8 F (36.6 C) 98 F (36.7 C)  TempSrc:  Temporal Oral Oral  SpO2:  100% 96% 98%  Weight:  77.4 kg    Height:        Intake/Output Summary (Last 24 hours) at 03/14/2021 1703 Last data filed at 03/14/2021 1100 Gross per 24 hour  Intake 76.93 ml  Output 2100 ml  Net -2023.07 ml   Last 3 Weights  03/14/2021 03/14/2021 03/12/2021  Weight (lbs) 170 lb 10.2 oz 175 lb 4.3 oz 175 lb 4.3  oz  Weight (kg) 77.4 kg 79.5 kg 79.5 kg     Body mass index is 25.2 kg/m.  General:  Comfortable, NAD HEENT: normal Neck: +JVD Cardiac:  Irregular, 2/6 systolic murmur Lungs:  Crackles at the bases bilaterally Abd: soft, nontender, no hepatomegaly  Ext: no edema Musculoskeletal:  No deformities, BUE and BLE strength normal and equal Skin: warm and dry  Neuro:  CNs 2-12 intact, no focal abnormalities noted Psych:  Normal affect   EKG:  The EKG was personally reviewed and demonstrates:  Afib, LVH, PVCs Telemetry:  Telemetry was personally reviewed and demonstrates:  Afib with controlled rates, occasional PVCs  Relevant CV Studies: TTE 03/13/21: IMPRESSIONS   1. Left ventricular ejection fraction, by estimation, is 35%. The left  ventricle demonstrates global hypokinesis. Left ventricular diastolic  parameters are indeterminate.   2. Right ventricular systolic function is moderately reduced. The right  ventricular size is moderately enlarged. There is severely elevated  pulmonary artery systolic pressure. The estimated right ventricular  systolic pressure is 63.8 mmHg.   3. Left atrial size was moderately dilated.   4. Right atrial size was mildly dilated.   5. The mitral valve is degenerative. Moderate mitral valve regurgitation.  No evidence of mitral stenosis.   6. Tricuspid valve regurgitation is moderate.   7. The aortic valve is tricuspid. There is mild calcification of the  aortic valve. Aortic valve sclerosis/calcification is present, without any  evidence of aortic stenosis.   8. The inferior vena cava is dilated in size with <50% respiratory  variability, suggesting right atrial pressure of 15 mmHg.   9. The patient was in atrial fibrillation.   Laboratory Data:  High Sensitivity Troponin:   Recent Labs  Lab 03/10/21 1616 03/10/21 2012  TROPONINIHS 76* 98*     Chemistry Recent  Labs  Lab 03/10/21 2012 03/10/21 2118 03/12/21 0427 03/13/21 0316 03/14/21 0200  NA  --    < > 136 135 137  K  --    < > 3.1* 4.6 4.3  CL  --    < > 96* 100 99  CO2  --    < > '27 24 23  '$ GLUCOSE  --    < > 110* 120* 100*  BUN  --    < > 16 31* 47*  CREATININE  --    < > 4.75* 7.56* 9.18*  CALCIUM  --    < > 8.8* 9.1 9.3  MG 2.2  --  2.1  --   --   GFRNONAA  --    < > 13* 8* 6*  ANIONGAP  --    < > '13 11 15   '$ < > = values in this interval not displayed.    Recent Labs  Lab 03/12/21 0427 03/14/21 0200  PROT 7.4  --   ALBUMIN 3.0* 3.0*  AST 19  --   ALT <5  --   ALKPHOS 66  --   BILITOT 0.7  --    Lipids  Recent Labs  Lab 03/11/21 0400  TRIG 83    Hematology Recent Labs  Lab 03/12/21 0427 03/13/21 0316 03/14/21 0200  WBC 5.0 5.1 4.6  RBC 4.67 4.58 4.43  HGB 14.2 13.7 13.5  HCT 44.7 43.9 41.8  MCV 95.7 95.9 94.4  MCH 30.4 29.9 30.5  MCHC 31.8 31.2 32.3  RDW 15.9* 16.1* 15.9*  PLT 56* 60* 63*   Thyroid No results for input(s): TSH, FREET4 in the last 168  hours.  BNPNo results for input(s): BNP, PROBNP in the last 168 hours.  DDimer No results for input(s): DDIMER in the last 168 hours.   Radiology/Studies:  DG Chest Port 1 View  Result Date: 03/11/2021 CLINICAL DATA:  Evaluate for pneumonia. EXAM: PORTABLE CHEST 1 VIEW COMPARISON:  03/10/2021 FINDINGS: Stable ET tube with tip above the carina. Enteric tube is been placed. The side port of the enteric tube is just below the level of the GE junction. Stable cardiac enlargement. Unchanged pleuroparenchymal scarring within the right base with blunting of the right costophrenic angle. Persistent opacity within the left lower lung. Diffuse pulmonary vascular congestion, unchanged. IMPRESSION: 1. Persistent opacity within the left lower lung compatible with pneumonia. 2. Stable ET tube and enteric tube. 3. Stable cardiac enlargement and pulmonary vascular congestion. Electronically Signed   By: Kerby Moors M.D.   On:  03/11/2021 08:57   DG Abd Portable 1V  Result Date: 03/10/2021 CLINICAL DATA:  Check gastric catheter placement EXAM: PORTABLE ABDOMEN - 1 VIEW COMPARISON:  10/06/2013 FINDINGS: Gastric catheter is noted with the tip just above the gastroesophageal junction. This should be advanced several cm deeper into the stomach. Multiple curvilinear calcifications are noted within the left abdomen, not well visualized on the prior exam. These likely represent wall calcifications within renal cysts but incompletely evaluated on this exam. IMPRESSION: Gastric catheter is noted in the distal esophagus. This should be advanced several cm deeper into the stomach and reimaged. Multiple curvilinear calcifications are noted within the left abdomen likely related to renal cystic change but incompletely evaluated on this exam. Follow-up can be performed as clinically necessary. Electronically Signed   By: Inez Catalina M.D.   On: 03/10/2021 19:18   ECHOCARDIOGRAM LIMITED  Result Date: 03/13/2021    ECHOCARDIOGRAM LIMITED REPORT   Patient Name:   TABIUS ROOD Date of Exam: 03/13/2021 Medical Rec #:  [id number]       Height:       69.0 in Accession #:    [id number]      Weight:       175.3 lb Date of Birth:  November 26, 1960       BSA:          1.953 m Patient Age:    18 years        BP:           152/91 mmHg Patient Gender: M               HR:           66 bpm. Exam Location:  Inpatient Procedure: Limited Echo, Limited Color Doppler and Cardiac Doppler Indications:    Hypotension [175102]  History:        Patient has prior history of Echocardiogram examinations, most                 recent 01/21/2019. CHF, Stroke and COPD, Signs/Symptoms:Shortness                 of Breath; Risk Factors:Current Smoker, Hypertension and                 Diabetes.  Sonographer:    Bernadene Person RDCS Referring Phys: [place]  1. Left ventricular ejection fraction, by estimation, is 35%. The left ventricle demonstrates global  hypokinesis. Left ventricular diastolic parameters are indeterminate.  2. Right ventricular systolic function is moderately reduced. The right ventricular size is moderately enlarged. There is severely elevated pulmonary  artery systolic pressure. The estimated right ventricular systolic pressure is 72.5 mmHg.  3. Left atrial size was moderately dilated.  4. Right atrial size was mildly dilated.  5. The mitral valve is degenerative. Moderate mitral valve regurgitation. No evidence of mitral stenosis.  6. Tricuspid valve regurgitation is moderate.  7. The aortic valve is tricuspid. There is mild calcification of the aortic valve. Aortic valve sclerosis/calcification is present, without any evidence of aortic stenosis.  8. The inferior vena cava is dilated in size with <50% respiratory variability, suggesting right atrial pressure of 15 mmHg.  9. The patient was in atrial fibrillation. FINDINGS  Left Ventricle: Left ventricular ejection fraction, by estimation, is 35%. The left ventricle demonstrates global hypokinesis. The left ventricular internal cavity size was normal in size. There is no left ventricular hypertrophy. Left ventricular diastolic parameters are indeterminate. Right Ventricle: The right ventricular size is moderately enlarged. No increase in right ventricular wall thickness. Right ventricular systolic function is moderately reduced. There is severely elevated pulmonary artery systolic pressure. The tricuspid regurgitant velocity is 3.99 m/s, and with an assumed right atrial pressure of 15 mmHg, the estimated right ventricular systolic pressure is 36.6 mmHg. Left Atrium: Left atrial size was moderately dilated. Right Atrium: Right atrial size was mildly dilated. Mitral Valve: The mitral valve is degenerative in appearance. There is moderate calcification of the mitral valve leaflet(s). Mild mitral annular calcification. Moderate mitral valve regurgitation. No evidence of mitral valve stenosis.  Tricuspid Valve: The tricuspid valve is normal in structure. Tricuspid valve regurgitation is moderate. Aortic Valve: The aortic valve is tricuspid. There is mild calcification of the aortic valve. Aortic valve sclerosis/calcification is present, without any evidence of aortic stenosis. Pulmonic Valve: The pulmonic valve was normal in structure. Pulmonic valve regurgitation is trivial. Aorta: The aortic root is normal in size and structure. Venous: The inferior vena cava is dilated in size with less than 50% respiratory variability, suggesting right atrial pressure of 15 mmHg. IAS/Shunts: No atrial level shunt detected by color flow Doppler. LEFT VENTRICLE PLAX 2D LVIDd:         5.20 cm LVIDs:         4.00 cm LV PW:         0.90 cm LV IVS:        1.00 cm LVOT diam:     2.00 cm LV SV:         47 LV SV Index:   24 LVOT Area:     3.14 cm  LV Volumes (MOD) LV vol d, MOD A2C: 202.0 ml LV vol d, MOD A4C: 132.0 ml LV vol s, MOD A2C: 103.0 ml LV vol s, MOD A4C: 84.5 ml LV SV MOD A2C:     99.0 ml LV SV MOD A4C:     132.0 ml LV SV MOD BP:      73.5 ml RIGHT VENTRICLE RV S prime:     4.45 cm/s TAPSE (M-mode): 1.0 cm LEFT ATRIUM         Index LA diam:    4.90 cm 2.51 cm/m  AORTIC VALVE LVOT Vmax:   86.55 cm/s LVOT Vmean:  52.050 cm/s LVOT VTI:    0.148 m  AORTA Ao Root diam: 3.20 cm MR Peak grad:    57.5 mmHg    TRICUSPID VALVE MR Mean grad:    40.0 mmHg    TR Peak grad:   63.7 mmHg MR Vmax:         379.00 cm/s  TR Vmax:        399.00 cm/s MR Vmean:        304.0 cm/s MR PISA:         0.57 cm     SHUNTS MR PISA Eff ROA: 6 mm        Systemic VTI:  0.15 m MR PISA Radius:  0.30 cm      Systemic Diam: 2.00 cm Dalton McleanMD Electronically signed by Franki Monte Signature Date/Time: 03/13/2021/6:05:46 PM    Final      Assessment and Plan:   #Acute on chronic combined systolic and diastolic HF: #Nonischemic CM: Patient with known history of nonischemic cardiomyopathy with EF 30% in 2015 that improved to 40-45% but is now  back down to 35%. Cath in 2015 without significant coronary disease. Has not followed regularly with Cardiology. Unable to tolerate GDMT due to hypotension and ESRD. -Volume management with HD -Cannot tolerate GDMT due to hypotension and ESRD -Consider repeat ischemic evaluation as out-patient  #RV Failure: #Severe Pulmonary HTN: Patient with severe pulmonary HTN and moderate RV dysfunction on TTE. PASP 78 in the setting of volume overload. On HD for volume management.   -Continue volume management with HD  #Newly Diagnosed Afib: CHADs-vasc 5. Currently rate controlled not on nodal agents. Has history of GIB in the past and is notably thrombocytopenic, however, is also a significant stroke risk given CHADs-vasc and reduced EF. Tolerated heparin in ICU. Favor trialing apixaban for [facility]. Can consider watchman as out-patient if patient compliant with medical follow-up. -Start apixaban for [facility] (would favor 2.'5mg'$  BID given thrombocytopenia and high risk for bleeding) -Could consider watchman in the future  #ESRD on HD: -Management per neprhology  #DMII: -Management per primary  #Post-op hypotension and respiratory failure: Resolved and successfully extubated. Likely with post-op vasoplegia and poor reserve given significant comorbidities    Risk Assessment/Risk Scores:        New York Heart Association (NYHA) Functional Class NYHA Class III  CHA2DS2-VASc Score =   5            For questions or updates, please contact [facility] Please consult [email or web address] for contact info under    Signed, Freada Bergeron, MD  03/14/2021 5:03 PM

## 2021-03-14 NOTE — Progress Notes (Addendum)
?  Postoperative hemodialysis access ? ? ? ? ?Date of Surgery:  03/10/2021 ?Surgeon: Donzetta Matters ? ?Subjective:  denies new pain in his left hand.   ? ?PHYSICAL EXAMINATION: ? ?Vitals:  ? 03/13/21 2327 03/14/21 0317  ?BP: (!) 151/75 (!) 108/44  ?Pulse: 72 63  ?Resp: (!) 21 20  ?Temp: 97.7 ?F (36.5 ?C) 98 ?F (36.7 ?C)  ?SpO2: 95% 93%  ? ?Bandages in place left arm.  +thrill in fistula ? ? ?ASSESSMENT/PLAN: ? ?Nathaniel White is a 61 y.o. year old male who is s/p revision of left arm AVF with plication complicated by near respiratory arrest ? ?-fistula worked well post op ?-do not stick over suture line x 8 weeks or longer.  Okay to stick above and below suture line now.   ?-will have pt f/u in 4 weeks to have staples removed.   Our office will arrange appt.  ?-please call with questions.  ? ? ?Leontine Locket, PA-C ?[facility] ?[phone] ? ?I have independently interviewed and examined patient and agree with PA assessment and plan above.  ? ?Zamzam Whinery C. Donzetta Matters, MD ?[facility] ?Office: [phone] ?Pager: [phone] ? ?

## 2021-03-14 NOTE — Progress Notes (Signed)
ANTICOAGULATION CONSULT NOTE ? ?Pharmacy Consult for  Lovenox ?Indication: atrial fibrillation ? ?Allergies  ?Allergen Reactions  ? Codeine Shortness Of Breath  ? Dextromethorphan-Guaifenesin Shortness Of Breath  ? Iron Dextran Shortness Of Breath  ? Morphine And Related Shortness Of Breath  ? ? ?Patient Measurements: ?Height: '5\' 9"'$  (175.3 cm) ?Weight: 77.4 kg (170 lb 10.2 oz) ?IBW/kg (Calculated) : 70.7 ?Heparin Dosing Weight: 81.6 kg ? ?Vital Signs: ?Temp: 97.8 ?F (36.6 ?C) (03/07 1135) ?Temp Source: Oral (03/07 1135) ?BP: 97/41 (03/07 1135) ?Pulse Rate: 76 (03/07 1135) ? ?Labs: ?Recent Labs  ?  03/12/21 ?0427 03/13/21 ?0316 03/14/21 ?0200  ?HGB 14.2 13.7 13.5  ?HCT 44.7 43.9 41.8  ?PLT 56* 60* 63*  ?CREATININE 4.75* 7.56* 9.18*  ? ? ? ?Estimated Creatinine Clearance: 8.8 mL/min (A) (by C-G formula based on SCr of 9.18 mg/dL (H)). ? ? ?Assessment: ?62 yom who presented to OR after planned revision of fistula due to recent area of denuded skin and oozing blood after scab removed. Has hx of ESRD, stroke, and thrombocytopenia. Patient has new Afib and was started on IV heparin for bleeding concern.  Phlebotomist attempted and was unable to get a heparin level due to restricted access and lower extremity swelling.  Lovenox is not ideal due to longer half-life and ESRD; however, options are limited.   ? ?No issues with bleeding per RN. Per MD, given platelets at 63,000, will hold enoxaparin at this time. ? ? ?Goal of Therapy:  ?Anti-Xa level 0.6-1 units/ml 4hrs after LMWH dose given ?Monitor platelets by anticoagulation protocol: Yes ?  ?Plan:   ?Stop enoxaparin per discussion with team  ? ? ?Benetta Spar, PharmD, BCPS, BCCP ?Clinical Pharmacist ? ?Please check AMION for all [place] phone numbers ?After 10:00 PM, call [place] [phone] ? ?

## 2021-03-14 NOTE — Progress Notes (Signed)
Admit: 03/10/2021 ?LOS: 4 ? ?79M ESRD THS with acute cardiac/pulm event perioperative of LUE AVF plication/revision.   ? ?Subjective:  ?HD overnight, 2L, used AVF ?Extubated, conversant today, no c/o  ? ?03/06 0701 - 03/07 0700 ?In: 563.1 [P.O.:250; I.V.:313.1] ?Out: -  ? ?Filed Weights  ? 03/12/21 0215 03/14/21 0745 03/14/21 1100  ?Weight: 79.5 kg 79.5 kg 77.4 kg  ? ? ?Scheduled Meds: ? Chlorhexidine Gluconate Cloth  6 each Topical Daily  ? docusate sodium  100 mg Oral BID  ? doxercalciferol  4 mcg Intravenous Q T,Th,Sa-HD  ? fluticasone furoate-vilanterol  1 puff Inhalation Daily  ? midodrine  10 mg Per Tube TID WC  ? montelukast  10 mg Oral QHS  ? ?Continuous Infusions: ? sodium chloride    ? ?PRN Meds:.acetaminophen, oxyCODONE ? ?Current Labs: reviewed  ? ? ?Physical Exam:  Blood pressure (!) 97/41, pulse 76, temperature 97.8 ?F (36.6 ?C), temperature source Oral, resp. rate 19, height '5\' 9"'$  (1.753 m), weight 77.4 kg, SpO2 96 %. ?GEN: awake, conversant, following commands ?ENT: NCAT, ?EYES: EOMI ?CV: RRR, nl s1s2 ?PULM: CTAB ?ABD: s/nt/nd ?SKIN: LUE AVF with about 2-3in of staples ?EXT:No LEE ? ?OP HD: AF TTS ? 4h 41mn  76kg  2/2 bath  LUE AVF  15g x 2  450/500  Hep none ? - hectorol 4 ug IV tiw ? - no esa or IV Fe  ? ? ? ? ?Assessment/ Plan: ?ESRD - usual HD is TTS.  We used AVF 3/04 postop and it worked well. Cont HD using 16g cannulate away from staples. HD today.  ?S/p plication/ revision of LUE AVF 03/10/21 ?Periop near-arrest/ apneic event - improved ?HFrEF ?CKD-BMM: cont hectorol, binder as able ?Anemia: not current issue ?HTN/Vol: no vol ^, at dry wt ? ? ?RKelly Splinter MD ?03/14/2021, 2:37 PM ? ? ? ? ? ?Recent Labs  ?Lab 03/12/21 ?0427 03/13/21 ?0316 03/14/21 ?0200  ?NA 136 135 137  ?K 3.1* 4.6 4.3  ?CL 96* 100 99  ?CO2 '27 24 23  '$ ?GLUCOSE 110* 120* 100*  ?BUN 16 31* 47*  ?CREATININE 4.75* 7.56* 9.18*  ?CALCIUM 8.8* 9.1 9.3  ?PHOS  --   --  7.0*  ? ? ?Recent Labs  ?Lab 03/12/21 ?0427 03/13/21 ?0316  03/14/21 ?0200  ?WBC 5.0 5.1 4.6  ?HGB 14.2 13.7 13.5  ?HCT 44.7 43.9 41.8  ?MCV 95.7 95.9 94.4  ?PLT 56* 60* 63*  ? ? ? ? ? ? ? ? ? ? ?  ?

## 2021-03-14 NOTE — Progress Notes (Signed)
ANTICOAGULATION CONSULT NOTE ? ?Pharmacy Consult for  Lovenox>>apixaban ?Indication: atrial fibrillation ? ?Allergies  ?Allergen Reactions  ? Codeine Shortness Of Breath  ? Dextromethorphan-Guaifenesin Shortness Of Breath  ? Iron Dextran Shortness Of Breath  ? Morphine And Related Shortness Of Breath  ? ? ?Patient Measurements: ?Height: '5\' 9"'$  (175.3 cm) ?Weight: 77.4 kg (170 lb 10.2 oz) ?IBW/kg (Calculated) : 70.7 ?Heparin Dosing Weight: 81.6 kg ? ?Vital Signs: ?Temp: 98 ?F (36.7 ?C) (03/07 1605) ?Temp Source: Oral (03/07 1605) ?BP: 100/58 (03/07 1605) ?Pulse Rate: 78 (03/07 1605) ? ?Labs: ?Recent Labs  ?  03/12/21 ?0427 03/13/21 ?0316 03/14/21 ?0200  ?HGB 14.2 13.7 13.5  ?HCT 44.7 43.9 41.8  ?PLT 56* 60* 63*  ?CREATININE 4.75* 7.56* 9.18*  ? ? ? ?Estimated Creatinine Clearance: 8.8 mL/min (A) (by C-G formula based on SCr of 9.18 mg/dL (H)). ? ? ?Assessment: ?43 yom who presented to OR after planned revision of fistula due to recent area of denuded skin and oozing blood after scab removed. Has hx of ESRD, stroke, and thrombocytopenia. Patient has new Afib and was started on IV heparin for bleeding concern.  Phlebotomist attempted and was unable to get a heparin level due to restricted access and lower extremity swelling.  Lovenox is not ideal due to longer half-life and ESRD; however, options are limited.   ? ?No issues with bleeding per RN. Per MD, given platelets at 63,000, will hold enoxaparin at this time. ? ?Ordered to transition to apixaban tonight. He would normally qualify for standard dose apixaban (age<80, wt>60kg) but due to his increase risk of bleeding and low plts, cards recommended 2.'5mg'$  BID. ? ?Goal of Therapy:  ?Monitor platelets by anticoagulation protocol: Yes ?  ?Plan:   ?Apixaban 2.'5mg'$  BID ?Rx will follow peripherally ? ?Onnie Boer, PharmD, BCIDP, AAHIVP, CPP ?Infectious Disease Pharmacist ?03/14/2021 6:06 PM ? ? ?

## 2021-03-14 NOTE — Progress Notes (Signed)
PROGRESS NOTE    Nathaniel White  AVW:[id number] DOB: November 23, 1960 DOA: 03/10/2021 PCP: Dorna Mai, MD    Brief Narrative:  61 year old male w/ sig PMH ESRD (MWF), asthma, CHF, difficulty waking up from anesthesia in past, COPD, type II DM, esophageal varices, prior stroke w/ left sided deficits, HTN, HFrEF (40-45%), MR, memory loss, prior stroke, MR, tobacco abuse  and Left arm AVF which has required multiple plications in past. Presented to the OR for planned revision of fistula due to recent area of denuded skin and oozing blood after scab removed.  Was brought to OR. Was getting Propofol for sedation. Shortly after administration BP rapidly declined w/ SBP initially in 150s to < 100 and pt was no longer spontaneously breathing. Received: calcium, vasopressin and neo  stick. Intubated. Maintained on neo gtt and transferred to ICU   3/3 admitted to ICU from OR after precipitous hypotension following propofol sedation complicated further by respiratory failure requiring intubation.  Revision was completed of AVG 3/4 extubated, iHD 3/5 remains on low dose neo, on [place] 6LPM 3/7 TRH pickup    Consultants:  Pccm , nephrology  Procedures:   Antimicrobials:      Subjective: In dialysis.  While lying in bed denies any chest pain, shortness of breath, or dizziness  Objective: Vitals:   03/14/21 1000 03/14/21 1030 03/14/21 1100 03/14/21 1135  BP: (!) 86/38 (!) 98/52 (!) 109/49 (!) 97/41  Pulse: 62 71 69 76  Resp:   16 19  Temp:   (!) 97.5 F (36.4 C) 97.8 F (36.6 C)  TempSrc:   Temporal Oral  SpO2:   100% 96%  Weight:   77.4 kg   Height:        Intake/Output Summary (Last 24 hours) at 03/14/2021 1353 Last data filed at 03/14/2021 1100 Gross per 24 hour  Intake 99.89 ml  Output 2100 ml  Net -2000.11 ml   Filed Weights   03/12/21 0215 03/14/21 0745 03/14/21 1100  Weight: 79.5 kg 79.5 kg 77.4 kg    Examination: Calm, NAD decreased breath sounds anteriorly Reg s1/s2 no  gallop Soft benign +bs No edema Awake and alert Mood and affect appropriate in current setting    Data Reviewed: I have personally reviewed following labs and imaging studies  CBC: Recent Labs  Lab 03/10/21 1616 03/10/21 1712 03/10/21 2118 03/11/21 0400 03/12/21 0427 03/13/21 0316 03/14/21 0200  WBC 4.4  --   --  5.4 5.0 5.1 4.6  HGB 13.1   < > 14.6 13.9 14.2 13.7 13.5  HCT 40.5   < > 43.0 44.0 44.7 43.9 41.8  MCV 92.0  --   --  94.6 95.7 95.9 94.4  PLT 69*  --   --  75* 56* 60* 63*   < > = values in this interval not displayed.   Basic Metabolic Panel: Recent Labs  Lab 03/10/21 1616 03/10/21 1712 03/10/21 2012 03/10/21 2118 03/11/21 0400 03/12/21 0427 03/13/21 0316 03/14/21 0200  NA 139   < >  --  138 139 136 135 137  K 3.6   < >  --  4.6 5.5* 3.1* 4.6 4.3  CL 98  --   --   --  97* 96* 100 99  CO2 28  --   --   --  '26 27 24 23  '$ GLUCOSE 88  --   --   --  79 110* 120* 100*  BUN 30*  --   --   --  34* 16 31* 47*  CREATININE 7.61*  --   --   --  7.92* 4.75* 7.56* 9.18*  CALCIUM 10.1  --   --   --  9.6 8.8* 9.1 9.3  MG  --   --  2.2  --   --  2.1  --   --   PHOS  --   --   --   --   --   --   --  7.0*   < > = values in this interval not displayed.   GFR: Estimated Creatinine Clearance: 8.8 mL/min (A) (by C-G formula based on SCr of 9.18 mg/dL (H)). Liver Function Tests: Recent Labs  Lab 03/12/21 0427 03/14/21 0200  AST 19  --   ALT <5  --   ALKPHOS 66  --   BILITOT 0.7  --   PROT 7.4  --   ALBUMIN 3.0* 3.0*   No results for input(s): LIPASE, AMYLASE in the last 168 hours. No results for input(s): AMMONIA in the last 168 hours. Coagulation Profile: No results for input(s): INR, PROTIME in the last 168 hours. Cardiac Enzymes: No results for input(s): CKTOTAL, CKMB, CKMBINDEX, TROPONINI in the last 168 hours. BNP (last 3 results) No results for input(s): PROBNP in the last 8760 hours. HbA1C: No results for input(s): HGBA1C in the last 72  hours. CBG: Recent Labs  Lab 03/11/21 2348 03/12/21 0422 03/12/21 0728 03/12/21 1140 03/12/21 1554  GLUCAP 114* 112* 128* 137* 114*   Lipid Profile: No results for input(s): CHOL, HDL, LDLCALC, TRIG, CHOLHDL, LDLDIRECT in the last 72 hours. Thyroid Function Tests: No results for input(s): TSH, T4TOTAL, FREET4, T3FREE, THYROIDAB in the last 72 hours. Anemia Panel: No results for input(s): VITAMINB12, FOLATE, FERRITIN, TIBC, IRON, RETICCTPCT in the last 72 hours. Sepsis Labs: No results for input(s): PROCALCITON, LATICACIDVEN in the last 168 hours.  Recent Results (from the past 240 hour(s))  MRSA Next Gen by PCR, Nasal     Status: None   Collection Time: 03/11/21  6:12 AM   Specimen: Nasal Mucosa; Nasal Swab  Result Value Ref Range Status   MRSA by PCR Next Gen NOT DETECTED NOT DETECTED Final    Comment: (NOTE) The GeneXpert MRSA Assay (FDA approved for NASAL specimens only), is one component of a comprehensive MRSA colonization surveillance program. It is not intended to diagnose MRSA infection nor to guide or monitor treatment for MRSA infections. Test performance is not FDA approved in patients less than 63 years old. Performed at [facility], [place] [address] [place], [place] [address]          Radiology Studies: ECHOCARDIOGRAM LIMITED  Result Date: 03/13/2021    ECHOCARDIOGRAM LIMITED REPORT   Patient Name:   Nathaniel White Date of Exam: 03/13/2021 Medical Rec #:  [id number]       Height:       69.0 in Accession #:    [id number]      Weight:       175.3 lb Date of Birth:  [birth year]       BSA:          1.953 m Patient Age:    61 years        BP:           152/91 mmHg Patient Gender: M               HR:           66 bpm.  Exam Location:  Inpatient Procedure: Limited Echo, Limited Color Doppler and Cardiac Doppler Indications:    Hypotension [[id number]]  History:        Patient has prior history of Echocardiogram examinations, most                 recent 01/21/2019. CHF,  Stroke and COPD, Signs/Symptoms:Shortness                 of Breath; Risk Factors:Current Smoker, Hypertension and                 Diabetes.  Sonographer:    Bernadene Person RDCS Referring Phys: [place]  1. Left ventricular ejection fraction, by estimation, is 35%. The left ventricle demonstrates global hypokinesis. Left ventricular diastolic parameters are indeterminate.  2. Right ventricular systolic function is moderately reduced. The right ventricular size is moderately enlarged. There is severely elevated pulmonary artery systolic pressure. The estimated right ventricular systolic pressure is 01.7 mmHg.  3. Left atrial size was moderately dilated.  4. Right atrial size was mildly dilated.  5. The mitral valve is degenerative. Moderate mitral valve regurgitation. No evidence of mitral stenosis.  6. Tricuspid valve regurgitation is moderate.  7. The aortic valve is tricuspid. There is mild calcification of the aortic valve. Aortic valve sclerosis/calcification is present, without any evidence of aortic stenosis.  8. The inferior vena cava is dilated in size with <50% respiratory variability, suggesting right atrial pressure of 15 mmHg.  9. The patient was in atrial fibrillation. FINDINGS  Left Ventricle: Left ventricular ejection fraction, by estimation, is 35%. The left ventricle demonstrates global hypokinesis. The left ventricular internal cavity size was normal in size. There is no left ventricular hypertrophy. Left ventricular diastolic parameters are indeterminate. Right Ventricle: The right ventricular size is moderately enlarged. No increase in right ventricular wall thickness. Right ventricular systolic function is moderately reduced. There is severely elevated pulmonary artery systolic pressure. The tricuspid regurgitant velocity is 3.99 m/s, and with an assumed right atrial pressure of 15 mmHg, the estimated right ventricular systolic pressure is 79.3 mmHg. Left Atrium: Left  atrial size was moderately dilated. Right Atrium: Right atrial size was mildly dilated. Mitral Valve: The mitral valve is degenerative in appearance. There is moderate calcification of the mitral valve leaflet(s). Mild mitral annular calcification. Moderate mitral valve regurgitation. No evidence of mitral valve stenosis. Tricuspid Valve: The tricuspid valve is normal in structure. Tricuspid valve regurgitation is moderate. Aortic Valve: The aortic valve is tricuspid. There is mild calcification of the aortic valve. Aortic valve sclerosis/calcification is present, without any evidence of aortic stenosis. Pulmonic Valve: The pulmonic valve was normal in structure. Pulmonic valve regurgitation is trivial. Aorta: The aortic root is normal in size and structure. Venous: The inferior vena cava is dilated in size with less than 50% respiratory variability, suggesting right atrial pressure of 15 mmHg. IAS/Shunts: No atrial level shunt detected by color flow Doppler. LEFT VENTRICLE PLAX 2D LVIDd:         5.20 cm LVIDs:         4.00 cm LV PW:         0.90 cm LV IVS:        1.00 cm LVOT diam:     2.00 cm LV SV:         47 LV SV Index:   24 LVOT Area:     3.14 cm  LV Volumes (MOD) LV vol d, MOD A2C: 202.0 ml LV  vol d, MOD A4C: 132.0 ml LV vol s, MOD A2C: 103.0 ml LV vol s, MOD A4C: 84.5 ml LV SV MOD A2C:     99.0 ml LV SV MOD A4C:     132.0 ml LV SV MOD BP:      73.5 ml RIGHT VENTRICLE RV S prime:     4.45 cm/s TAPSE (M-mode): 1.0 cm LEFT ATRIUM         Index LA diam:    4.90 cm 2.51 cm/m  AORTIC VALVE LVOT Vmax:   86.55 cm/s LVOT Vmean:  52.050 cm/s LVOT VTI:    0.148 m  AORTA Ao Root diam: 3.20 cm MR Peak grad:    57.5 mmHg    TRICUSPID VALVE MR Mean grad:    40.0 mmHg    TR Peak grad:   63.7 mmHg MR Vmax:         379.00 cm/s  TR Vmax:        399.00 cm/s MR Vmean:        304.0 cm/s MR PISA:         0.57 cm     SHUNTS MR PISA Eff ROA: 6 mm        Systemic VTI:  0.15 m MR PISA Radius:  0.30 cm      Systemic Diam: 2.00 cm  Dalton McleanMD Electronically signed by Franki Monte Signature Date/Time: 03/13/2021/6:05:46 PM    Final         Scheduled Meds:  Chlorhexidine Gluconate Cloth  6 each Topical Daily   docusate sodium  100 mg Oral BID   doxercalciferol  4 mcg Intravenous Q T,Th,Sa-HD   fluticasone furoate-vilanterol  1 puff Inhalation Daily   midodrine  10 mg Per Tube TID WC   montelukast  10 mg Oral QHS   Continuous Infusions:  sodium chloride      Assessment & Plan:   Principal Problem:   Shock circulatory ([place]) Active Problems:   ESRD (end stage renal disease) on dialysis (HCC)   H/O   Anemia   History of nonadherence to medical treatment   History of CVA (cerebrovascular accident)   CHF, systolic dysfunction   Hemodialysis AV fistula aneurysm (HCC)   Respiratory failure (HCC)   Drug-induced hypotension   Periop near-arrest.  Question related to fragile baseline cardiopulmonary status.   3/7 s/p extubation Improving. TRH to pickup today.  Postop shock- again think this is just related to acute on chronic cardiorenal vasoplegia 3/7 off pressors , on midodrine Monitor hemodynamics.     DM2-  A1c 5 BG have been stable   Thrombocytopenia Heparin gtt was d/c'd on 3/6 Monitor   ESRD In HD today Usual HD TTS AVF 3/4 post op and it worked well S/p plication /revision of LUE AVF 03/10/21 Nephrology following   Deconditioning PT/OT consult   Afib - new  Echo ef 35%  Cardiology was consulted  DVT prophylaxis: scd Code Status: Full Family Communication: None at bedside Disposition Plan:  Status is: Inpatient Remains inpatient appropriate because: Hypotension, IV treatment, needing hemodialysis        LOS: 4 days   Time spent: 50 min with >50%     Nolberto Hanlon, MD [facility] Pager [phone]  If 7PM-7AM, please contact night-coverage 03/14/2021, 1:53 PM

## 2021-03-14 NOTE — Progress Notes (Signed)
Pt came back to rm 11 from dialysis. Reinitiated tele. VSS. Call bell within reach.  ? ?Lavenia Atlas, RN ? ?

## 2021-03-15 ENCOUNTER — Telehealth: Payer: Self-pay | Admitting: Cardiovascular Disease

## 2021-03-15 ENCOUNTER — Other Ambulatory Visit (HOSPITAL_COMMUNITY): Payer: Self-pay

## 2021-03-15 DIAGNOSIS — J9621 Acute and chronic respiratory failure with hypoxia: Secondary | ICD-10-CM

## 2021-03-15 DIAGNOSIS — I5040 Unspecified combined systolic (congestive) and diastolic (congestive) heart failure: Secondary | ICD-10-CM

## 2021-03-15 DIAGNOSIS — Z8673 Personal history of transient ischemic attack (TIA), and cerebral infarction without residual deficits: Secondary | ICD-10-CM

## 2021-03-15 DIAGNOSIS — I952 Hypotension due to drugs: Secondary | ICD-10-CM

## 2021-03-15 DIAGNOSIS — I110 Hypertensive heart disease with heart failure: Secondary | ICD-10-CM

## 2021-03-15 DIAGNOSIS — Z91199 Patient's noncompliance with other medical treatment and regimen due to unspecified reason: Secondary | ICD-10-CM

## 2021-03-15 LAB — CBC WITH DIFFERENTIAL/PLATELET
Abs Immature Granulocytes: 0.01 10*3/uL (ref 0.00–0.07)
Basophils Absolute: 0.1 10*3/uL (ref 0.0–0.1)
Basophils Relative: 2 %
Eosinophils Absolute: 0.5 10*3/uL (ref 0.0–0.5)
Eosinophils Relative: 13 %
HCT: 41.8 % (ref 39.0–52.0)
Hemoglobin: 13.6 g/dL (ref 13.0–17.0)
Immature Granulocytes: 0 %
Lymphocytes Relative: 15 %
Lymphs Abs: 0.6 10*3/uL — ABNORMAL LOW (ref 0.7–4.0)
MCH: 30.1 pg (ref 26.0–34.0)
MCHC: 32.5 g/dL (ref 30.0–36.0)
MCV: 92.5 fL (ref 80.0–100.0)
Monocytes Absolute: 0.5 10*3/uL (ref 0.1–1.0)
Monocytes Relative: 13 %
Neutro Abs: 2.4 10*3/uL (ref 1.7–7.7)
Neutrophils Relative %: 57 %
Platelets: 71 10*3/uL — ABNORMAL LOW (ref 150–400)
RBC: 4.52 MIL/uL (ref 4.22–5.81)
RDW: 15.9 % — ABNORMAL HIGH (ref 11.5–15.5)
WBC: 4.1 10*3/uL (ref 4.0–10.5)
nRBC: 0 % (ref 0.0–0.2)

## 2021-03-15 LAB — COMPREHENSIVE METABOLIC PANEL
ALT: <5 U/L (ref 0–44)
AST: 17 U/L (ref 15–41)
Albumin: 3.1 g/dL — ABNORMAL LOW (ref 3.5–5.0)
Alkaline Phosphatase: 58 U/L (ref 38–126)
Anion gap: 14 (ref 5–15)
BUN: 30 mg/dL — ABNORMAL HIGH (ref 6–20)
CO2: 26 mmol/L (ref 22–32)
Calcium: 9.8 mg/dL (ref 8.9–10.3)
Chloride: 99 mmol/L (ref 98–111)
Creatinine, Ser: 7.67 mg/dL — ABNORMAL HIGH (ref 0.61–1.24)
GFR, Estimated: 8 mL/min — ABNORMAL LOW (ref >60)
Glucose, Bld: 89 mg/dL (ref 70–99)
Potassium: 4.1 mmol/L (ref 3.5–5.1)
Sodium: 139 mmol/L (ref 135–145)
Total Bilirubin: 1.2 mg/dL (ref 0.3–1.2)
Total Protein: 7.5 g/dL (ref 6.5–8.1)

## 2021-03-15 LAB — MAGNESIUM: Magnesium: 2.2 mg/dL (ref 1.7–2.4)

## 2021-03-15 LAB — PHOSPHORUS: Phosphorus: 5.7 mg/dL — ABNORMAL HIGH (ref 2.5–4.6)

## 2021-03-15 MED ORDER — DOCUSATE SODIUM 100 MG PO CAPS
100.0000 mg | ORAL_CAPSULE | Freq: Two times a day (BID) | ORAL | 0 refills | Status: DC
  Filled 2021-03-15: qty 10, 5d supply, fill #0

## 2021-03-15 MED ORDER — FLUTICASONE-SALMETEROL 250-50 MCG/ACT IN AEPB
1.0000 | INHALATION_SPRAY | Freq: Two times a day (BID) | RESPIRATORY_TRACT | 0 refills | Status: DC
  Filled 2021-03-15: qty 60, 30d supply, fill #0

## 2021-03-15 MED ORDER — APIXABAN 2.5 MG PO TABS
2.5000 mg | ORAL_TABLET | Freq: Two times a day (BID) | ORAL | 0 refills | Status: DC
  Filled 2021-03-15: qty 60, 30d supply, fill #0

## 2021-03-15 MED ORDER — MIDODRINE HCL 10 MG PO TABS
10.0000 mg | ORAL_TABLET | Freq: Three times a day (TID) | ORAL | 0 refills | Status: DC
  Filled 2021-03-15: qty 90, 30d supply, fill #0

## 2021-03-15 NOTE — Care Management Important Message (Signed)
Important Message ? ?Patient Details  ?Name: Nathaniel White ?MRN: [id number] ?Date of Birth: 1960/03/24 ? ? ?Medicare Important Message Given:  Yes ? ? ? ? ?Shelda Altes ?03/15/2021, 8:49 AM ?

## 2021-03-15 NOTE — Progress Notes (Signed)
Pt receives out-pt HD at [facility] SW on TTS. Pt has a 7:15 chair time. Contacted clinic to make them aware pt for d/c today and will resume care tomorrow.  ? ?Melven Sartorius ?Renal Navigator ?[phone] ?

## 2021-03-15 NOTE — TOC Transition Note (Signed)
Transition of Care (TOC) - CM/SW Discharge Note ?Marvetta Gibbons Therapist, sports, BSN ?Transitions of Care ?Unit 4E- RN Case Manager ?See Treatment Team for direct phone #  ? ? ?Patient Details  ?Name: Nathaniel White ?MRN: [id number] ?Date of Birth: 10/05/1960 ? ?Transition of Care (TOC) CM/SW Contact:  ?Dahlia Client, Romeo Rabon, RN ?Phone Number: ?03/15/2021, 12:40 PM ? ? ?Clinical Narrative:    ?Pt stable for transition home today, Orders placed for HHPT/OT.  ?CM in to speak with pt prior to discharge. Pt agreeable to [facility] services- states he has had them in the past. List provided to pt for [facility] choice Per CMS guidelines from medicare.gov website with star ratings (copy placed in shadow chart) . Pt reports he thinks it was [facility] he used in the past and would like to use them again for [facility] needs. Pt states he has all needed DME at home- no new needs at this time.  ? ?Pt reports he has transportation home, has friends that come to assist him at home.  ?Address, phone # and PCP (pt reports he goes to [facility] and sees Archie Patten currently- changes in system).  ? ?TOC pharmacy to fill meds prior to discharge and deliver to bedside.  ? ?Call made to [facility] with [facility]- for HHPT/OT - referral has been accepted- request visits on off HD days (pt goes to HD-TTS) ? ? ?Final next level of care: [place] ?Barriers to Discharge: No Barriers Identified ? ? ?Patient Goals and CMS Choice ?Patient states their goals for this hospitalization and ongoing recovery are:: return home ?CMS Medicare.gov Compare Post Acute Care list provided to:: Patient ?Choice offered to / list presented to : Patient ? ?Discharge Placement ?  ?           ?  ? Home w/ HH ?  ?  ? ?Discharge Plan and Services ?  ?Discharge Planning Services: CM Consult ?Post Acute Care Choice: Home Health          ?DME Arranged: N/A ?DME Agency: NA ?  ?  ?  ?HH Arranged: PT, OT ?[place] Agency: [place] ([place]) ?Date HH Agency Contacted: 03/15/21 ?Time [place]: 1240 ?Representative spoke with at [place]: Corene Cornea ? ?Social Determinants of Health (SDOH) Interventions ?  ? ? ?Readmission Risk Interventions ?Readmission Risk Prevention Plan 03/15/2021  ?Transportation Screening Complete  ?PCP or Specialist Appt within 5-7 Days Complete  ?Home Care Screening Complete  ?Medication Review (RN CM) Complete  ?Some recent data might be hidden  ? ? ? ? ? ?

## 2021-03-15 NOTE — Progress Notes (Signed)
OT Cancellation Note ? ?Patient Details ?Name: [name] ?MRN: [id number] ?DOB: 12/01/1960 ? ? ?Cancelled Treatment:    Reason Eval/Treat Not Completed: Patient declined, no reason specified (Patient declined OT due to expecting to discharge home soon) ?Lodema Hong, OTA ?Acute Rehabilitation Services  ?Pager [phone] ?Office [phone] ? ?Chasin Findling Alexis Goodell ?03/15/2021, 1:25 PM ?

## 2021-03-15 NOTE — Progress Notes (Signed)
Discharge instructions reviewed with pt, all questions answered. Reviewed by RN Zenia Resides, RN.  ?

## 2021-03-15 NOTE — Discharge Summary (Signed)
Physician Discharge Summary   Patient: Nathaniel White MRN: [id number] DOB: 26-Jun-1960  Admit date:     03/10/2021  Discharge date: 03/15/21  Discharge Physician: Kerney Elbe   PCP: Gildardo Pounds, NP   Recommendations at discharge:   Follow-up and reestablish with PCP and see them within 1 to 2 weeks Follow-up with cardiology in outpatient setting: Appointment scheduled for 03/23/2021 at 2:20 PM with Jory Sims, PA Follow up with Nephrology within 1-2 weeks  Follow up with Vascular Surgery in 4 weeks to have staples removed and do not stick over suture line x8 weeks or longer and ok to stick above and below suture line  Discharge Diagnoses: Principal Problem:   Shock circulatory ([place]) Active Problems:   ESRD (end stage renal disease) on dialysis ([place])   H/O   Anemia   History of nonadherence to medical treatment   History of CVA (cerebrovascular accident)   CHF, systolic dysfunction   Hemodialysis AV fistula aneurysm ([place])   Respiratory failure ([place])   Drug-induced hypotension  Resolved Problems:   * No resolved hospital problems. [facility] Hospital Course: The patient  is 61 year old male w/ sig PMH ESRD (MWF), asthma, CHF, difficulty waking up from anesthesia in past, COPD, type II DM, esophageal varices, prior stroke w/ left sided deficits, HTN, HFrEF (40-45%), MR, memory loss, prior stroke, MR, tobacco abuse  and Left arm AVF which has required multiple plications in past. Presented to the OR for planned revision of fistula due to recent area of denuded skin and oozing blood after scab removed.  Was brought to OR. Was getting Propofol for sedation. Shortly after administration BP rapidly declined w/ SBP initially in 150s to < 100 and pt was no longer spontaneously breathing. Received: calcium, vasopressin and neo stick. Intubated. Maintained on neo gtt and transferred to ICU    3/3 admitted to ICU from OR after precipitous hypotension following propofol sedation  complicated further by respiratory failure requiring intubation.  Revision was completed of AVG 3/4 extubated, iHD 3/5 remains on low dose neo, on [place] 6LPM Improved and transferred to [facility] service 03/14/21 Discharged Home 03/15/21 in stable condition after Cardiology, Vascular Surgery, and Nephrology Clearance    Assessment and Plan: No notes have been filed under this hospital service. Service: Hospitalist  Periop near-arrest/Apneic Event.  Question related to fragile baseline cardiopulmonary status.   3/6 s/p extubation and weaned to Room Air Improving. -SpO2: 98 % O2 Flow Rate (L/min): 2 L/min FiO2 (%): (S) 40 % -No longer wearing Supplemental O2   Postop shock- again think this is just related to acute on chronic cardiorenal vasoplegia 3/7 off pressors , on midodrine and will continue Daily  Monitor hemodynamics and stable for D/C    DM2-  -A1c 5.4 -BG have been stable  Thrombocytopenia -Heparin gtt was d/c'd on 3/6 -Continue to Monitor  -Improving. Platelet Count went from 56 -> 60 -> 63 -> 71 -Changed to Apixaban per Cards   ESRD Hyperphosphatemia -In HD today -Usual HD TTS -AVF 3/4 post op and it worked well -S/p plication /revision of LUE AVF 03/10/21 -Nephrology following and stable for Discharge  Deconditioning -PT/OT consult    New A Fib Severe Pulmonary HTN Acute on Chronic Combined Systolic and Diastolic HF -Patient with known history of nonischemic cardiomyopathy with EF 30% in 2015 that improved to 40-45% but is now back down 30 to 35%. Cath in 2015 without significant coronary disease. Has not followed regularly with Cardiology.  Unable to tolerate GDMT due to hypotension and ESRD. -Volume management with HD -Cannot tolerate GDMT due to hypotension and ESRD -Consider repeat ischemic evaluation as out-patient per Cardiology -[place] of 5 -Currently Rate Controlled on Nodal Agents -C/w Apixaban for AC -Continue Watchman in the Future   Pain control -  [employer] Controlled Substance Reporting System database was reviewed. and patient was instructed, not to drive, operate heavy machinery, perform activities at heights, swimming or participation in water activities or provide baby-sitting services while on Pain, Sleep and Anxiety Medications; until their outpatient Physician has advised to do so again. Also recommended to not to take more than prescribed Pain, Sleep and Anxiety Medications.  Consultants: Nephrology, Vascular Surgery, and Cardiology, PCCM Transfer Procedures performed: Revision of left arm AV fistula with plication  Disposition: Home health Diet recommendation:  Discharge Diet Orders (From admission, onward)     Start     Ordered   03/15/21 0000  Diet - low sodium heart healthy        03/15/21 1153           Renal diet DISCHARGE MEDICATION: Allergies as of 03/15/2021       Reactions   Codeine Shortness Of Breath   Dextromethorphan-guaifenesin Shortness Of Breath   Iron Dextran Shortness Of Breath   Morphine And Related Shortness Of Breath        Medication List     STOP taking these medications    aspirin EC 81 MG tablet   oxyCODONE 5 MG immediate release tablet Commonly known as: Roxicodone       TAKE these medications    acetaminophen 500 MG tablet Commonly known as: TYLENOL Take 1 tablet (500 mg total) by mouth every 6 (six) hours as needed. What changed: reasons to take this   Advair Diskus 250-50 MCG/ACT Aepb Generic drug: fluticasone-salmeterol Inhale 1 puff into the lungs in the morning and at bedtime.   albuterol 108 (90 Base) MCG/ACT inhaler Commonly known as: ProAir HFA Inhale 2 puffs into the lungs every 6 (six) hours as needed for wheezing or shortness of breath.   docusate sodium 100 MG capsule Commonly known as: COLACE Take 1 capsule (100 mg total) by mouth 2 (two) times daily.   Eliquis 2.5 MG Tabs tablet Generic drug: apixaban Take 1 tablet (2.5 mg total) by mouth 2  (two) times daily.   glucose blood test strip   midodrine 10 MG tablet Commonly known as: PROAMATINE Place 1 tablet (10 mg total) into feeding tube 3 (three) times daily with meals. What changed:  how to take this when to take this additional instructions   montelukast 10 MG tablet Commonly known as: SINGULAIR Take 1 tablet (10 mg total) by mouth at bedtime.   Velphoro 500 MG chewable tablet Generic drug: sucroferric oxyhydroxide Chew 1,000 mg by mouth 2 (two) times daily after a meal.   Visine Advanced Relief 0.05-0.1-1-1 % Soln Generic drug: Tetrahydroz-Dextran-PEG-Povid Place 1 drop into both eyes 2 (two) times daily as needed (for dryness).               Discharge Care Instructions  (From admission, onward)           Start     Ordered   03/15/21 0000  Discharge wound care:       Comments: Per Vascular Surgery   03/15/21 1153            Follow-up Information     [facility]  Follow up in 5 week(s).   Specialty: Vascular Surgery Why: Office will call you to arrange your appt (sent) Contact information: [place] Follow up.   Why: [phone]- [facility]- HHPT/OT arranged- they wil contact you to schedule visits        Gildardo Pounds, NP. Schedule an appointment as soon as possible for a visit in 1 week(s).   Specialty: Nurse Practitioner Why: f/u with PCP Contact information: [place] [address] [phone]                Discharge Exam: Filed Weights   03/12/21 0215 03/14/21 0745 03/14/21 1100  Weight: 79.5 kg 79.5 kg 77.4 kg   Vitals:   03/15/21 0438 03/15/21 0743  BP: (!) 128/57 (!) 111/58  Pulse: 67 67  Resp: 17 17  Temp: 97.9 F (36.6 C) 98.1 F (36.7 C)  SpO2: 97% 98%   Examination: Physical Exam:  Constitutional: WN/WD, NAD and appears calm and comfortable Respiratory:  Diminished to auscultation bilaterally with coarse breath sounds, no wheezing, rales, rhonchi or crackles. Normal respiratory effort and patient is not tachypenic. No accessory muscle use. Unlabored breathing  Cardiovascular: RRR, no murmurs / rubs / gallops. S1 and S2 auscultated.  Abdomen: Soft, non-tender, non-distended. Bowel sounds positive.  GU: Deferred. Musculoskeletal: Left Arm AVF .   Condition at discharge: stable  The results of significant diagnostics from this hospitalization (including imaging, microbiology, ancillary and laboratory) are listed below for reference.   Imaging Studies: DG Chest Port 1 View  Result Date: 03/11/2021 CLINICAL DATA:  Evaluate for pneumonia. EXAM: PORTABLE CHEST 1 VIEW COMPARISON:  03/10/2021 FINDINGS: Stable ET tube with tip above the carina. Enteric tube is been placed. The side port of the enteric tube is just below the level of the GE junction. Stable cardiac enlargement. Unchanged pleuroparenchymal scarring within the right base with blunting of the right costophrenic angle. Persistent opacity within the left lower lung. Diffuse pulmonary vascular congestion, unchanged. IMPRESSION: 1. Persistent opacity within the left lower lung compatible with pneumonia. 2. Stable ET tube and enteric tube. 3. Stable cardiac enlargement and pulmonary vascular congestion. Electronically Signed   By: Kerby Moors M.D.   On: 03/11/2021 08:57   DG Chest Port 1 View  Result Date: 03/10/2021 CLINICAL DATA:  Endotracheal tube placement. EXAM: PORTABLE CHEST 1 VIEW COMPARISON:  Radiographs 03/03/2018 and 04/13/2006.  CT 10/08/2013. FINDINGS: 1421 hours. Endotracheal tube tip terminates 2.8 cm above the carina. Stable cardiomegaly and aortic atherosclerosis. There is stable scarring at the right lung base with associated blunting of the costophrenic angle. There is persistent vascular congestion with increased patchy opacity at the left lung base which may reflect asymmetric  edema or inflammation. No consolidation or large pleural effusion identified. No pneumothorax. The bones appear unchanged. IMPRESSION: 1. Satisfactory position of the endotracheal tube. 2. New patchy left basilar opacity which may reflect edema or early pneumonia. 3. Stable cardiomegaly, vascular congestion and right basilar pleuroparenchymal scarring. Electronically Signed   By: Richardean Sale M.D.   On: 03/10/2021 14:39   DG Abd Portable 1V  Result Date: 03/10/2021 CLINICAL DATA:  Check gastric catheter placement EXAM: PORTABLE ABDOMEN - 1 VIEW COMPARISON:  10/06/2013 FINDINGS: Gastric catheter is noted with the tip just above the gastroesophageal junction. This should be advanced several cm deeper into the stomach. Multiple curvilinear calcifications are noted within the left abdomen, not  well visualized on the prior exam. These likely represent wall calcifications within renal cysts but incompletely evaluated on this exam. IMPRESSION: Gastric catheter is noted in the distal esophagus. This should be advanced several cm deeper into the stomach and reimaged. Multiple curvilinear calcifications are noted within the left abdomen likely related to renal cystic change but incompletely evaluated on this exam. Follow-up can be performed as clinically necessary. Electronically Signed   By: Inez Catalina M.D.   On: 03/10/2021 19:18   ECHOCARDIOGRAM LIMITED  Result Date: 03/13/2021    ECHOCARDIOGRAM LIMITED REPORT   Patient Name:   Nathaniel White Date of Exam: 03/13/2021 Medical Rec #:  [id number]       Height:       69.0 in Accession #:    [id number]      Weight:       175.3 lb Date of Birth:  20-Oct-1960       BSA:          1.953 m Patient Age:    53 years        BP:           152/91 mmHg Patient Gender: M               HR:           66 bpm. Exam Location:  Inpatient Procedure: Limited Echo, Limited Color Doppler and Cardiac Doppler Indications:    Hypotension [[id number]]  History:        Patient has prior history of  Echocardiogram examinations, most                 recent 01/21/2019. CHF, Stroke and COPD, Signs/Symptoms:Shortness                 of Breath; Risk Factors:Current Smoker, Hypertension and                 Diabetes.  Sonographer:    Bernadene Person RDCS Referring Phys: [place]  1. Left ventricular ejection fraction, by estimation, is 35%. The left ventricle demonstrates global hypokinesis. Left ventricular diastolic parameters are indeterminate.  2. Right ventricular systolic function is moderately reduced. The right ventricular size is moderately enlarged. There is severely elevated pulmonary artery systolic pressure. The estimated right ventricular systolic pressure is 27.5 mmHg.  3. Left atrial size was moderately dilated.  4. Right atrial size was mildly dilated.  5. The mitral valve is degenerative. Moderate mitral valve regurgitation. No evidence of mitral stenosis.  6. Tricuspid valve regurgitation is moderate.  7. The aortic valve is tricuspid. There is mild calcification of the aortic valve. Aortic valve sclerosis/calcification is present, without any evidence of aortic stenosis.  8. The inferior vena cava is dilated in size with <50% respiratory variability, suggesting right atrial pressure of 15 mmHg.  9. The patient was in atrial fibrillation. FINDINGS  Left Ventricle: Left ventricular ejection fraction, by estimation, is 35%. The left ventricle demonstrates global hypokinesis. The left ventricular internal cavity size was normal in size. There is no left ventricular hypertrophy. Left ventricular diastolic parameters are indeterminate. Right Ventricle: The right ventricular size is moderately enlarged. No increase in right ventricular wall thickness. Right ventricular systolic function is moderately reduced. There is severely elevated pulmonary artery systolic pressure. The tricuspid regurgitant velocity is 3.99 m/s, and with an assumed right atrial pressure of 15 mmHg, the  estimated right ventricular systolic pressure is 17.0 mmHg. Left Atrium: Left atrial size was moderately dilated.  Right Atrium: Right atrial size was mildly dilated. Mitral Valve: The mitral valve is degenerative in appearance. There is moderate calcification of the mitral valve leaflet(s). Mild mitral annular calcification. Moderate mitral valve regurgitation. No evidence of mitral valve stenosis. Tricuspid Valve: The tricuspid valve is normal in structure. Tricuspid valve regurgitation is moderate. Aortic Valve: The aortic valve is tricuspid. There is mild calcification of the aortic valve. Aortic valve sclerosis/calcification is present, without any evidence of aortic stenosis. Pulmonic Valve: The pulmonic valve was normal in structure. Pulmonic valve regurgitation is trivial. Aorta: The aortic root is normal in size and structure. Venous: The inferior vena cava is dilated in size with less than 50% respiratory variability, suggesting right atrial pressure of 15 mmHg. IAS/Shunts: No atrial level shunt detected by color flow Doppler. LEFT VENTRICLE PLAX 2D LVIDd:         5.20 cm LVIDs:         4.00 cm LV PW:         0.90 cm LV IVS:        1.00 cm LVOT diam:     2.00 cm LV SV:         47 LV SV Index:   24 LVOT Area:     3.14 cm  LV Volumes (MOD) LV vol d, MOD A2C: 202.0 ml LV vol d, MOD A4C: 132.0 ml LV vol s, MOD A2C: 103.0 ml LV vol s, MOD A4C: 84.5 ml LV SV MOD A2C:     99.0 ml LV SV MOD A4C:     132.0 ml LV SV MOD BP:      73.5 ml RIGHT VENTRICLE RV S prime:     4.45 cm/s TAPSE (M-mode): 1.0 cm LEFT ATRIUM         Index LA diam:    4.90 cm 2.51 cm/m  AORTIC VALVE LVOT Vmax:   86.55 cm/s LVOT Vmean:  52.050 cm/s LVOT VTI:    0.148 m  AORTA Ao Root diam: 3.20 cm MR Peak grad:    57.5 mmHg    TRICUSPID VALVE MR Mean grad:    40.0 mmHg    TR Peak grad:   63.7 mmHg MR Vmax:         379.00 cm/s  TR Vmax:        399.00 cm/s MR Vmean:        304.0 cm/s MR PISA:         0.57 cm     SHUNTS MR PISA Eff ROA: 6 mm         Systemic VTI:  0.15 m MR PISA Radius:  0.30 cm      Systemic Diam: 2.00 cm Dalton McleanMD Electronically signed by Franki Monte Signature Date/Time: 03/13/2021/6:05:46 PM    Final     Microbiology: Results for orders placed or performed during the hospital encounter of 03/10/21  MRSA Next Gen by PCR, Nasal     Status: None   Collection Time: 03/11/21  6:12 AM   Specimen: Nasal Mucosa; Nasal Swab  Result Value Ref Range Status   MRSA by PCR Next Gen NOT DETECTED NOT DETECTED Final    Comment: (NOTE) The GeneXpert MRSA Assay (FDA approved for NASAL specimens only), is one component of a comprehensive MRSA colonization surveillance program. It is not intended to diagnose MRSA infection nor to guide or monitor treatment for MRSA infections. Test performance is not FDA approved in patients less than 9 years old. Performed at [facility],  [address], [place], [place] [address]    Labs: CBC: Recent Labs  Lab 03/11/21 0400 03/12/21 0427 03/13/21 0316 03/14/21 0200 03/15/21 0848  WBC 5.4 5.0 5.1 4.6 4.1  NEUTROABS  --   --   --   --  2.4  HGB 13.9 14.2 13.7 13.5 13.6  HCT 44.0 44.7 43.9 41.8 41.8  MCV 94.6 95.7 95.9 94.4 92.5  PLT 75* 56* 60* 63* 71*   Basic Metabolic Panel: Recent Labs  Lab 03/10/21 2012 03/10/21 2118 03/11/21 0400 03/12/21 0427 03/13/21 0316 03/14/21 0200 03/15/21 0848  NA  --    < > 139 136 135 137 139  K  --    < > 5.5* 3.1* 4.6 4.3 4.1  CL  --   --  97* 96* 100 99 99  CO2  --   --  '26 27 24 23 26  '$ GLUCOSE  --   --  79 110* 120* 100* 89  BUN  --   --  34* 16 31* 47* 30*  CREATININE  --   --  7.92* 4.75* 7.56* 9.18* 7.67*  CALCIUM  --   --  9.6 8.8* 9.1 9.3 9.8  MG 2.2  --   --  2.1  --   --  2.2  PHOS  --   --   --   --   --  7.0* 5.7*   < > = values in this interval not displayed.   Liver Function Tests: Recent Labs  Lab 03/12/21 0427 03/14/21 0200 03/15/21 0848  AST 19  --  17  ALT <5  --  <5  ALKPHOS 66  --  58  BILITOT  0.7  --  1.2  PROT 7.4  --  7.5  ALBUMIN 3.0* 3.0* 3.1*   CBG: Recent Labs  Lab 03/11/21 2348 03/12/21 0422 03/12/21 0728 03/12/21 1140 03/12/21 1554  GLUCAP 114* 112* 128* 137* 114*   Discharge time spent: greater than 30 minutes.  Signed: Raiford Noble, DO [facility] 03/15/2021

## 2021-03-15 NOTE — Progress Notes (Signed)
Physical Therapy Treatment ?Patient Details ?Name: Nathaniel White ?MRN: [id number] ?DOB: Sep 12, 1960 ?Today's Date: 03/15/2021 ? ? ?History of Present Illness Patient is a 61 y/o male who presents on 03/10/21 for revision of the LUE fistula. Procedure complicated by near-code event requiring pressor initiation and intubated 03/10/21-03/11/21. PMH includes cardiomyopathy, ESRD on T/TH/S, asthma, stroke, CHF, COPD, DM, HTN, PVD, tobacco abuse. ? ?  ?PT Comments  ? ? Pt progressing towards his physical therapy goals, demonstrating mildly improved ambulation distance and endurance. Pt trialed ambulating with Rollator x 80 feet, as this is the device he typically uses at baseline. However, pt not able to use very functionally due to left sided residual deficits from prior stroke. May benefit from trialing a cane next session. D/c plan remains appropriate.  ?   ?Recommendations for follow up therapy are one component of a multi-disciplinary discharge planning process, led by the attending physician.  Recommendations may be updated based on patient status, additional functional criteria and insurance authorization. ? ?Follow Up Recommendations ? Home health PT ?  ?  ?Assistance Recommended at Discharge Intermittent Supervision/Assistance  ?Patient can return home with the following A little help with walking and/or transfers;A little help with bathing/dressing/bathroom;Assistance with cooking/housework;Assist for transportation;Help with stairs or ramp for entrance ?  ?Equipment Recommendations ? None recommended by PT  ?  ?Recommendations for Other Services   ? ? ?  ?Precautions / Restrictions Precautions ?Precautions: Fall ?Restrictions ?Weight Bearing Restrictions: No  ?  ? ?Mobility ? Bed Mobility ?  ?  ?  ?  ?  ?  ?  ?General bed mobility comments: OOB in chair ?  ? ?Transfers ?Overall transfer level: Needs assistance ?Equipment used: Rollator (4 wheels) ?Transfers: Sit to/from Stand ?Sit to Stand: Min assist ?  ?  ?  ?  ?   ?General transfer comment: Light minA from [facility] and Rollator ?  ? ?Ambulation/Gait ?Ambulation/Gait assistance: Min guard ?Gait Distance (Feet): 80 Feet ?Assistive device: Rollator (4 wheels) ?Gait Pattern/deviations: Step-through pattern, Step-to pattern, Decreased step length - left, Decreased dorsiflexion - left, Steppage, Trunk flexed ?Gait velocity: decreased ?Gait velocity interpretation: <1.8 ft/sec, indicate of risk for recurrent falls ?  ?General Gait Details: Slow, hemiparetic type gait with decreased step length and foot clearance LLE. Increased trunk flexion. Pt not able to use L hand/arm functionally on Rollator. ? ? ?Stairs ?  ?  ?  ?  ?  ? ? ?Wheelchair Mobility ?  ? ?Modified Rankin (Stroke Patients Only) ?  ? ? ?  ?Balance Overall balance assessment: Needs assistance ?Sitting-balance support: Feet supported, No upper extremity supported ?Sitting balance-Leahy Scale: Fair ?  ?  ?Standing balance support: During functional activity, Reliant on assistive device for balance ?Standing balance-Leahy Scale: Poor ?  ?  ?  ?  ?  ?  ?  ?  ?  ?  ?  ?  ?  ? ?  ?Cognition Arousal/Alertness: Awake/alert ?Behavior During Therapy: [facility] for tasks assessed/performed ?Overall Cognitive Status: History of cognitive impairments - at baseline ?  ?  ?  ?  ?  ?  ?  ?  ?  ?  ?  ?  ?  ?  ?  ?  ?General Comments: ST memory deficits at baseline after CVA. Pt engaging in conversation and following simple commands. ?  ?  ? ?  ?Exercises   ? ?  ?General Comments   ?  ?  ? ?Pertinent Vitals/Pain Pain Assessment ?Pain Assessment: No/denies pain  ? ? ?  Home Living   ?  ?  ?  ?  ?  ?  ?  ?  ?  ?   ?  ?Prior Function    ?  ?  ?   ? ?PT Goals (current goals can now be found in the care plan section) Acute Rehab PT Goals ?Patient Stated Goal: to go home and be able to get to my car ?Potential to Achieve Goals: Good ?Progress towards PT goals: Progressing toward goals ? ?  ?Frequency ? ? ? Min 3X/week ? ? ? ?  ?PT Plan Current plan remains  appropriate  ? ? ?Co-evaluation   ?  ?  ?  ?  ? ?  ?AM-PAC PT "6 Clicks" Mobility   ?Outcome Measure ? Help needed turning from your back to your side while in a flat bed without using bedrails?: None ?Help needed moving from lying on your back to sitting on the side of a flat bed without using bedrails?: A Little ?Help needed moving to and from a bed to a chair (including a wheelchair)?: A Little ?Help needed standing up from a chair using your arms (e.g., wheelchair or bedside chair)?: A Little ?Help needed to walk in hospital room?: A Little ?Help needed climbing 3-5 steps with a railing? : A Lot ?6 Click Score: 18 ? ?  ?End of Session   ?Activity Tolerance: Patient tolerated treatment well ?Patient left: in chair;with call bell/phone within reach;with chair alarm set ?Nurse Communication: Mobility status ?PT Visit Diagnosis: Muscle weakness (generalized) (M62.81);Difficulty in walking, not elsewhere classified (R26.2) ?  ? ? ?Time: 8242-3536 ?PT Time Calculation (min) (ACUTE ONLY): 28 min ? ?Charges:  $Therapeutic Activity: 23-37 mins          ?          ? ?Wyona Almas, PT, DPT ?Acute Rehabilitation Services ?Pager [phone] ?Office [phone] ? ? ? ?Deno Etienne ?03/15/2021, 10:46 AM ? ?

## 2021-03-15 NOTE — Telephone Encounter (Signed)
TOC scheduled for 03/23/21 at 2:20pm with Jory Sims, PA ?

## 2021-03-15 NOTE — Progress Notes (Signed)
Admit: 03/10/2021 ?LOS: 5 ? ? ?Subjective: 2.1 L off w/ HD yesterday ? ?03/07 0701 - 03/08 0700 ?In: 100 [P.O.:100] ?Out: 2100  ? ?Filed Weights  ? 03/12/21 0215 03/14/21 0745 03/14/21 1100  ?Weight: 79.5 kg 79.5 kg 77.4 kg  ? ? ?Scheduled Meds: ? apixaban  2.5 mg Oral BID  ? Chlorhexidine Gluconate Cloth  6 each Topical Daily  ? docusate sodium  100 mg Oral BID  ? doxercalciferol  4 mcg Intravenous Q T,Th,Sa-HD  ? fluticasone furoate-vilanterol  1 puff Inhalation Daily  ? midodrine  10 mg Per Tube TID WC  ? montelukast  10 mg Oral QHS  ? ?Continuous Infusions: ? sodium chloride    ? ?PRN Meds:.acetaminophen, oxyCODONE ? ?Current Labs: reviewed  ? ? ?Physical Exam:  Blood pressure (!) 111/58, pulse 67, temperature 98.1 ?F (36.7 ?C), temperature source Oral, resp. rate 17, height '5\' 9"'$  (1.753 m), weight 77.4 kg, SpO2 98 %. ?GEN: awake, conversant, following commands ?ENT: NCAT, ?EYES: EOMI ?CV: RRR, nl s1s2 ?PULM: CTAB ?ABD: s/nt/nd ?SKIN: LUE AVF with about 2-3in of staples ?EXT:No LEE ? ?OP HD: AF TTS ? 4h 24mn  76kg  2/2 bath  LUE AVF  15g x 2  450/500  Hep none ? - hectorol 4 ug IV tiw ? - no esa or IV Fe  ? ?Assessment/ Plan: ?Periop near-arrest/ apneic event - improved  ?Atrial fib - new onset, cards recommended eliquis lower dose 2.5 bid. Rate controlled, not on nodal agents.  ?S/p plication/ revision of LUE AVF 03/10/21 ?ESRD - usual HD is TTS.  HD here 3/5 and 3/7.  ?Pulm HTN - severe, per cardiology ?Chronic hypotension - on midodrine 10 tid, continue. BP's stable ?HFrEF - EF 30-35% ?CKD-BMM: cont hectorol, binder as able ?Anemia: not current issue ?HTN/Vol: no vol ^, at dry wt ?Dispo - stable for d/c from renal standpoint ? ? ? ?RKelly Splinter MD ?03/15/2021, 11:57 AM ? ? ? ? ? ?Recent Labs  ?Lab 03/13/21 ?0316 03/14/21 ?0200 03/15/21 ?03159 ?NA 135 137 139  ?K 4.6 4.3 4.1  ?CL 100 99 99  ?CO2 '24 23 26  '$ ?GLUCOSE 120* 100* 89  ?BUN 31* 47* 30*  ?CREATININE 7.56* 9.18* 7.67*  ?CALCIUM 9.1 9.3 9.8  ?PHOS  --  7.0*  5.7*  ? ? ?Recent Labs  ?Lab 03/13/21 ?0316 03/14/21 ?0200 03/15/21 ?04585 ?WBC 5.1 4.6 4.1  ?NEUTROABS  --   --  2.4  ?HGB 13.7 13.5 13.6  ?HCT 43.9 41.8 41.8  ?MCV 95.9 94.4 92.5  ?PLT 60* 63* 71*  ? ? ? ? ? ? ? ? ? ? ?  ?

## 2021-03-15 NOTE — Hospital Course (Signed)
The patient  is 61 year old male w/ sig PMH ESRD (MWF), asthma, CHF, difficulty waking up from anesthesia in past, COPD, type II DM, esophageal varices, prior stroke w/ left sided deficits, HTN, HFrEF (40-45%), MR, memory loss, prior stroke, MR, tobacco abuse  and Left arm AVF which has required multiple plications in past. Presented to the OR for planned revision of fistula due to recent area of denuded skin and oozing blood after scab removed.  ?Was brought to OR. Was getting Propofol for sedation. Shortly after administration BP rapidly declined w/ SBP initially in 150s to < 100 and pt was no longer spontaneously breathing. Received: calcium, vasopressin and neo stick. Intubated. Maintained on neo gtt and transferred to ICU  ?  ?3/3 admitted to ICU from OR after precipitous hypotension following propofol sedation complicated further by respiratory failure requiring intubation.  Revision was completed of AVG ?3/4 extubated, iHD ?3/5 remains on low dose neo, on [place] 6LPM ?Improved and transferred to [facility] service 03/14/21 ?Discharged Home 03/15/21 in stable condition after Cardiology, Vascular Surgery, and Nephrology Clearance ?  ?

## 2021-03-16 ENCOUNTER — Telehealth: Payer: Self-pay

## 2021-03-16 ENCOUNTER — Ambulatory Visit: Payer: Medicare Other | Admitting: Orthopedic Surgery

## 2021-03-16 NOTE — Telephone Encounter (Signed)
Tried to call patient, no answer and no voicemail to leave message. 

## 2021-03-16 NOTE — TOC Transition Note (Signed)
Transition of care contact from inpatient facility ? ?Date of discharge: 03/15/21 ?Date of contact: 03/16/21 ?Method: Attempted Phone Call ?Spoke to: No Answer ? ?Patient contacted to discuss transition of care from recent inpatient hospitalization but patient did not pick up the phone. Unable to leave voicemail. Automated message states: "voice mailbox has not been set up yet".  ? ?Patient will follow up with his outpatient HD unit on: 03/16/21 at [facility] ? ?Tobie Poet, NP ?  ?

## 2021-03-16 NOTE — Telephone Encounter (Signed)
Correction, Angelena Form, PA ? ?I apologize ?

## 2021-03-16 NOTE — Telephone Encounter (Signed)
Transition Care Management Unsuccessful Follow-up Telephone Call ? ?Date of discharge and from where:  03/15/2021, [facility]  ? ?Attempts:  1st Attempt ? ?Reason for unsuccessful TCM follow-up call:  Unable to leave message ? Voicemail not set up # [phone]. ? ?Patient has appointment with Geryl Rankins, NP  @ [facility]- 03/29/2021.  ? ? ?

## 2021-03-17 ENCOUNTER — Telehealth: Payer: Self-pay

## 2021-03-17 NOTE — Telephone Encounter (Signed)
**Note De-Identified  Obfuscation** Transition Care Management Unsuccessful Follow-up Telephone Call ? ?Date of discharge and from where:  03/15/2021 ? ?Attempts:  2nd Attempt ? ?Reason for unsuccessful TCM follow-up call:  Unable to leave message ?No answer and the pts VM has not been set up so I could not leave a message. ?Will call him again on Monday. ? ?  ?

## 2021-03-17 NOTE — Telephone Encounter (Signed)
Transition Care Management Follow-up Telephone Call ?  ?Date of discharge and from where:  03/15/2021, [facility]  ? How have you been since you were released from the hospital? [facility] better  ?Any questions or concerns? No questions/concerns reported.  ?Items Reviewed: ?Did the pt receive and understand the discharge instructions provided? have the instructions and have no questions ?Medications obtained and verified? He said that he have the medication list  and the hospital staff reviewed them in detail prior to discharge. He said that he has all of the medications and they have no questions.  ?Any new allergies since your discharge? None reported  ?Do you have support at home? Yes, friend ?Other (ie: DME, Home Health, etc)      ? none ?Functional Questionnaire: (I = Independent and D = Dependent) ?ADL's:  Independent.    ?  ?  ?Follow up appointments reviewed: ?  ??PCP Hospital f/u appt confirmed? NP Raul Del on 03/29/2021@ 1050.  ??[facility] f/u appt confirmed? scheduled at this time  ??Are transportation arrangements needed? have transportation   ??If their condition worsens, is the pt aware to call  their PCP or go to the ED? yes ??Was the patient provided with contact information for the PCP's office or ED? He has the phone number  ??Was the pt encouraged to call back with questions or concerns?yes ? ?

## 2021-03-20 NOTE — Telephone Encounter (Signed)
**Note De-Identified  Obfuscation** Transition Care Management Unsuccessful Follow-up Telephone Call ? ?Date of discharge and from where:  03/15/2021 from [facility] ? ?Attempts:  3rd Attempt ? ?Reason for unsuccessful TCM follow-up call:  The pt answered his phone but stated he was driving and could not write anything down. ?I asked him to call Jeani Hawking back at [phone] (he did verified that he has our number on his caller ID) concerning his f/u with Angelena Form, PA-c on 3/16 at 2:20. ? ?  ?

## 2021-03-22 ENCOUNTER — Telehealth (HOSPITAL_COMMUNITY): Payer: Self-pay

## 2021-03-22 ENCOUNTER — Other Ambulatory Visit (HOSPITAL_COMMUNITY): Payer: Self-pay

## 2021-03-22 NOTE — Telephone Encounter (Signed)
Pharmacy Transitions of Care Follow-up Telephone Call ? ?Date of discharge: 03/15/21  ?Discharge Diagnosis: Shock circulatory ? ?How have you been since you were released from the hospital? Patient was unaware of any appointments or follow ups. Went over all of patient's upcoming follow ups and where they were and their time and who they were seeing multiple times. Patient has the dates and times written down. No questions about medications at this time. ? ?Medication changes made at discharge: ?    START taking: ?docusate sodium (COLACE)  ?Eliquis (apixaban)  ?CHANGE how you take: ?midodrine (PROAMATINE)  ?STOP taking: ?aspirin EC 81 MG tablet  ?oxyCODONE 5 MG immediate release tablet (Roxicodone)  ? ?Medication changes verified by the patient? Yes ?  ? ?Medication Accessibility: ? ?Home Pharmacy: [facility] [employer]  ? ?Was the patient provided with refills on discharged medications? No  ? ?Have all prescriptions been transferred from [facility] to home pharmacy? N/A ? ?Is the patient able to afford medications? Has insurance ?  ? ?Medication Review: ?Unable to go over medication with patient at this time. Has f/u with cardiologist tomorrow. ? ? ?Follow-up Appointments: ? ?PCP Hospital f/u appt confirmed? Scheduled to see Dr. Raul Del on 03/29/21 @ 2:10pm.  ? ?Specialist Hospital f/u appt confirmed? Scheduled to see Dr. Grandville Silos on 03/23/21 @ 2:20pm.  ? ?If their condition worsens, is the pt aware to call PCP or go to the Emergency Dept.? Yes ? ?Final Patient Assessment: ?Patient has follow up scheduled and refills at home pharmacy ? ?

## 2021-03-23 ENCOUNTER — Encounter: Payer: Self-pay | Admitting: Physician Assistant

## 2021-03-23 ENCOUNTER — Ambulatory Visit (INDEPENDENT_AMBULATORY_CARE_PROVIDER_SITE_OTHER): Payer: Medicare Other | Admitting: Physician Assistant

## 2021-03-23 ENCOUNTER — Encounter: Payer: Medicare Other | Admitting: Orthopedic Surgery

## 2021-03-23 ENCOUNTER — Other Ambulatory Visit: Payer: Self-pay

## 2021-03-23 VITALS — BP 122/60 | HR 94 | Ht 69.0 in | Wt 167.0 lb

## 2021-03-23 DIAGNOSIS — I4891 Unspecified atrial fibrillation: Secondary | ICD-10-CM

## 2021-03-23 DIAGNOSIS — I50810 Right heart failure, unspecified: Secondary | ICD-10-CM | POA: Diagnosis not present

## 2021-03-23 DIAGNOSIS — I428 Other cardiomyopathies: Secondary | ICD-10-CM | POA: Diagnosis not present

## 2021-03-23 DIAGNOSIS — Z992 Dependence on renal dialysis: Secondary | ICD-10-CM

## 2021-03-23 DIAGNOSIS — I272 Pulmonary hypertension, unspecified: Secondary | ICD-10-CM | POA: Diagnosis not present

## 2021-03-23 DIAGNOSIS — N186 End stage renal disease: Secondary | ICD-10-CM

## 2021-03-23 NOTE — Patient Instructions (Signed)
Medication Instructions:  ? ?Your physician recommends that you continue on your current medications as directed. Please refer to the Current Medication list given to you today. ? ? ?*If you need a refill on your cardiac medications before your next appointment, please call your pharmacy* ? ? ?Lab Work: CBC TODAY  ? ?If you have labs (blood work) drawn today and your tests are completely normal, you will receive your results only by: ?MyChart Message (if you have MyChart) OR ?A paper copy in the mail ?If you have any lab test that is abnormal or we need to change your treatment, we will call you to review the results. ? ? ?Testing/Procedures: NONE ORDERED  TODAY ? ? ? ? ?Follow-Up: ?At [facility], you and your health needs are our priority.  As part of our continuing mission to provide you with exceptional heart care, we have created designated Provider Care Teams.  These Care Teams include your primary Cardiologist (physician) and Advanced Practice Providers (APPs -  Physician Assistants and Nurse Practitioners) who all work together to provide you with the care you need, when you need it. ? ?We recommend signing up for the patient portal called "MyChart".  Sign up information is provided on this After Visit Summary.  MyChart is used to connect with patients for Virtual Visits (Telemedicine).  Patients are able to view lab/test results, encounter notes, upcoming appointments, etc.  Non-urgent messages can be sent to your provider as well.   ?To learn more about what you can do with MyChart, go to [email or web address].   ? ?Your next appointment:   ?3 month(s) ? ?The format for your next appointment:   ?In Person ? ?Provider:   ?Lauree Chandler, MD   ? ? ?Other Instructions ? ?

## 2021-03-23 NOTE — Progress Notes (Signed)
?Cardiology Office Note:   ? ?Date:  03/23/2021  ? ?ID:  Nathaniel White, DOB 25-Jan-1960, MRN [id number] ? ?PCP:  Gildardo Pounds, NP ?  ?[facility] ?Cardiologist:  Lauree Chandler, MD    ? ?Referring MD: Gildardo Pounds, NP  ? ?Post hospital follow up  ? ?History of Present Illness:   ? ?Nathaniel White is a 61 y.o. male with a hx of nonischemic cardiomyopathy with LVEF 30%>40-45%>35%, HTN, HLD, PAD, ESRD on HD (T,T,S) failed renal transplant in 2010, COPD, DMII, tobacco use, cerebrovascular accident with residual left-sided , weakness.hx of GI bleeding, thrombocytopenia and recent admission for acute on chronic systolic heart failure and new onset atrial fibrillation who presents to clinic for follow-up. ? ?Nathaniel White is a 61 year old male with history of known nonischemic cardiomyopathy with cath in 2015 without obstructive disease who was last seen in clinic by Dr. Angelena Form in 2020 but then was lost to follow-up.  ? ?He was recently admitted from 3/2 - 03/15/2021.  He presented for revision of AV fistula fistula with post-op course complicated by hypotension and respiratory failure requiring intubation. Episode thought to be due to vasoplegia in the setting of underlying ESRD. TTE 03/13/21 with LVEF 35% (from 40-45%), with moderate RV dysfunction, severe PHTN, moderate MR, RAP 39mHg. Also noted to have new onset Afib. He was seen by Dr. PJohney Frameduring this admission and she recommended considering repeat ischemic evaluation as an outpatient. Unable to tolerate GDMT due to hypotension and ESRD.  She cautiously started him on apixaban 2.5 mg twice daily given history of GI bleeding and thrombocytopenia.  She also mentioned consideration of watchman in the future if he can demonstrate compliance with follow-up. ? ?Today he presents to clinic for follow-up. Here with aid.  He reports doing well since leaving the hospital.  No lower extremity edema, orthopnea or PND.  No chest pain.  No  palpitations.  His aide reports that he has had continuous blood in his stool for several months.  This is reportedly not gotten worse since starting Eliquis.  The patient is very reluctant to admit this and thinks it is due to hemorrhoids. ? ? ?Past Medical History:  ?Diagnosis Date  ? Anemia   ? Anxiety   ? Asthma   ? CHF (congestive heart failure) ([place]   ? Complication from renal dialysis device 11/02/2013  ? Complication of anesthesia 2017  ? according to pt and spouse pt was moving around and cough while under and pt had difficulty waking up so the anesthesia had to be reversed. and pt admitted to ICU.   ? COPD (chronic obstructive pulmonary disease) ([place]   ? Diabetes mellitus without complication ([place]   ? Type II - Patient states he does not have diabetes, "they said sometimes when you start dialysis you don't have diabetes any longer"   ? ESOPHAGEAL VARICES 10/04/2008  ? Qualifier: Diagnosis of  By: SFuller PlanMD [place]   ? ESRD   ? [place] ? Hemiparesis due to old stroke ([facility]   ? left  ? Hypertension   ? Hx, not current problems, no meds  ? LV dysfunction   ? EF 25-30% by echo 07/2011  ? 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 due to stroke  ? MR (mitral regurgitation)   ? moderate to severe, echo 07/2011  ? Peripheral vascular disease ([place]   ? Shortness of breath   ?  with exertion  ? Stroke [facility])   ? TIA's-left sided weakness  ? Tobacco abuse   ? ? ?Past Surgical History:  ?Procedure Laterality Date  ? ABDOMINAL AORTOGRAM W/LOWER EXTREMITY Bilateral 07/21/2018  ? Procedure: ABDOMINAL AORTOGRAM W/LOWER EXTREMITY;  Surgeon: Waynetta Sandy, MD;  Location: [place] CV LAB;  Service: Cardiovascular;  Laterality: Bilateral;  ? AMPUTATION Left 11/05/2018  ? Procedure: AMPUTATION SECOND TOE LEFT FOOT;  Surgeon: Waynetta Sandy, MD;  Location: [place];  Service: Vascular;  Laterality: Left;  ? AMPUTATION Left 02/13/2021  ? Procedure: Left small finger AMPUTATION  DIGIT;  Surgeon: Sherilyn Cooter, MD;  Location: [place];  Service: Orthopedics;  Laterality: Left;  ? AV FISTULA PLACEMENT    ? CARDIAC CATHETERIZATION    ? [place] [facility]  ? COLONOSCOPY W/ BIOPSIES AND POLYPECTOMY    ? FISTULA SUPERFICIALIZATION Left 11/10/2013  ? Procedure: FISTULA PLICATION;  Surgeon: Serafina Mitchell, MD;  Location: [place];  Service: Vascular;  Laterality: Left;  ? KIDNEY TRANSPLANT    ? 2011 rejected kidney 2012 back on dialysis  ? LEFT AND RIGHT HEART CATHETERIZATION WITH CORONARY ANGIOGRAM N/A 10/09/2013  ? Procedure: LEFT AND RIGHT HEART CATHETERIZATION WITH CORONARY ANGIOGRAM;  Surgeon: Burnell Blanks, MD;  Location: [facility] CATH LAB;  Service: Cardiovascular;  Laterality: N/A;  ? PERIPHERAL VASCULAR INTERVENTION Left 07/21/2018  ? Procedure: PERIPHERAL VASCULAR INTERVENTION;  Surgeon: Waynetta Sandy, MD;  Location: [place] CV LAB;  Service: Cardiovascular;  Laterality: Left;  Popliteal  ? REVISON OF ARTERIOVENOUS FISTULA Left 08/26/2013  ? Procedure: EXCISION OF ERODED SKIN AND EXPLORATION OF MAIN LEFT UPPER ARM AV FISTULA;  Surgeon: Rosetta Posner, MD;  Location: [place];  Service: Vascular;  Laterality: Left;  ? REVISON OF ARTERIOVENOUS FISTULA Left 02/05/2014  ? Procedure: REPAIR OF ARTERIOVENOUS FISTULA ANEURYSM;  Surgeon: Serafina Mitchell, MD;  Location: [place];  Service: Vascular;  Laterality: Left;  ? REVISON OF ARTERIOVENOUS FISTULA Left 03/10/2021  ? Procedure: LEFT ARM REVISION OF ARTERIOVENOUS FISTULA WITH PLICATION;  Surgeon: Waynetta Sandy, MD;  Location: [place];  Service: Vascular;  Laterality: Left;  ? SHUNTOGRAM N/A 11/05/2012  ? Procedure: Fistulogram;  Surgeon: Serafina Mitchell, MD;  Location: [facility] CATH LAB;  Service: Cardiovascular;  Laterality: N/A;  ? TEE WITHOUT CARDIOVERSION  08/17/2011  ? Procedure: TRANSESOPHAGEAL ECHOCARDIOGRAM (TEE);  Surgeon: Larey Dresser, MD;  Location: [place];  Service: Cardiovascular;  Laterality: N/A;  ? VIDEO ASSISTED  THORACOSCOPY (VATS)/DECORTICATION  08/10/11  ? ? ?Current Medications: ?Current Meds  ?Medication Sig  ? acetaminophen (TYLENOL) 500 MG tablet Take 1 tablet (500 mg total) by mouth every 6 (six) hours as needed. (Patient taking differently: Take 500 mg by mouth every 6 (six) hours as needed for mild pain or headache.)  ? albuterol (PROAIR HFA) 108 (90 Base) MCG/ACT inhaler Inhale 2 puffs into the lungs every 6 (six) hours as needed for wheezing or shortness of breath.  ? apixaban (ELIQUIS) 2.5 MG TABS tablet Take 1 tablet (2.5 mg total) by mouth 2 (two) times daily.  ? docusate sodium (COLACE) 100 MG capsule Take 1 capsule (100 mg total) by mouth 2 (two) times daily.  ? fluticasone-salmeterol (ADVAIR DISKUS) 250-50 MCG/ACT AEPB Inhale 1 puff into the lungs in the morning and at bedtime.  ? glucose blood test strip   ? midodrine (PROAMATINE) 10 MG tablet Place 1 tablet (10 mg total) into feeding tube 3 (three) times daily with meals.  ? montelukast (  SINGULAIR) 10 MG tablet Take 1 tablet (10 mg total) by mouth at bedtime.  ? VELPHORO 500 MG chewable tablet Chew 1,000 mg by mouth 2 (two) times daily after a meal.  ? VISINE ADVANCED RELIEF 0.05-0.1-1-1 % SOLN Place 1 drop into both eyes 2 (two) times daily as needed (for dryness).  ?  ? ?Allergies:   Codeine, Dextromethorphan-guaifenesin, Iron dextran, and Morphine and related  ? ?Social History  ? ?Socioeconomic History  ? Marital status: Single  ?  Spouse name: Not on file  ? Number of children: 1  ? Years of education: Not on file  ? Highest education level: Not on file  ?Occupational History  ? Occupation: Teacher, music  ?  Employer: DISABLED  ?Tobacco Use  ? Smoking status: Former  ?  Packs/day: 0.50  ?  Years: 15.00  ?  Pack years: 7.50  ?  Types: Cigarettes  ?  Quit date: 2014  ?  Years since quitting: 9.2  ? Smokeless tobacco: Never  ?Vaping Use  ? Vaping Use: Never used  ?Substance and Sexual Activity  ? Alcohol use: No  ?  Alcohol/week: 0.0 standard  drinks  ? Drug use: No  ? Sexual activity: Not Currently  ?Other Topics Concern  ? Not on file  ?Social History Narrative  ? Not on file  ? ?Social Determinants of Health  ? ?Financial Resource Strain: Not on file  ?F

## 2021-03-24 LAB — CBC
Hematocrit: 39.7 % (ref 37.5–51.0)
Hemoglobin: 13.1 g/dL (ref 13.0–17.7)
MCH: 30 pg (ref 26.6–33.0)
MCHC: 33 g/dL (ref 31.5–35.7)
MCV: 91 fL (ref 79–97)
Platelets: 73 10*3/uL — CL (ref 150–450)
RBC: 4.36 x10E6/uL (ref 4.14–5.80)
RDW: 15.4 % (ref 11.6–15.4)
WBC: 4.1 10*3/uL (ref 3.4–10.8)

## 2021-03-27 ENCOUNTER — Ambulatory Visit (INDEPENDENT_AMBULATORY_CARE_PROVIDER_SITE_OTHER): Payer: Medicare Other | Admitting: Orthopedic Surgery

## 2021-03-27 ENCOUNTER — Other Ambulatory Visit: Payer: Self-pay

## 2021-03-27 ENCOUNTER — Encounter: Payer: Self-pay | Admitting: Orthopedic Surgery

## 2021-03-27 DIAGNOSIS — L03012 Cellulitis of left finger: Secondary | ICD-10-CM

## 2021-03-27 NOTE — Progress Notes (Signed)
? ?Post-Op Visit Note ?  ?Patient: Nathaniel White           ?Date of Birth: [birth year]           ?MRN: [id number] ?Visit Date: 03/27/2021 ?PCP: Gildardo Pounds, NP ? ? ?Assessment & Plan: ? ?Chief Complaint:  ?Chief Complaint  ?Patient presents with  ? Left Hand - Routine Post Op  ? ?Visit Diagnoses:  ?1. Paronychia of finger, left   ? ? ?Plan: Patient is now 6 weeks s/p left small finger amputation for severe soft tissue infection.  His wound has completely healed.  He has no pain in his finger or hand.  He is happy with his result today.  He can follow up with me again as needed.  ? ?Follow-Up Instructions: No follow-ups on file.  ? ?Orders:  ?No orders of the defined types were placed in this encounter. ? ?No orders of the defined types were placed in this encounter. ? ? ?Imaging: ?No results found. ? ?PMFS History: ?Patient Active Problem List  ? Diagnosis Date Noted  ? Respiratory failure ([place]) 03/10/2021  ? Shock circulatory ([place]) 03/10/2021  ? Drug-induced hypotension 03/10/2021  ? Finger infection   ? Paronychia of finger, left 02/03/2021  ? Left hand pain 12/29/2020  ? Pressure ulcer of ankle 09/01/2018  ? Thrombocytopenia ([place]) 01/17/2015  ? Transfusion history 01/17/2015  ? Hemodialysis AV fistula aneurysm ([place]) 11/10/2013  ? Cough 09/07/2013  ? CHF, systolic dysfunction 40/98/1191  ? Chronic obstructive pulmonary disease ([place]) 07/06/2012  ? History of CVA (cerebrovascular accident) 08/13/2011  ? ESRD (end stage renal disease) on dialysis ([place]) 03/13/2011  ? HTN (hypertension) 03/13/2011  ? H/O 03/13/2011  ? Anemia 03/13/2011  ? Fluid overload 03/13/2011  ? History of nonadherence to medical treatment 03/13/2011  ? Tobacco abuse 03/13/2011  ? HEMATOCHEZIA 10/04/2008  ? ?Past Medical History:  ?Diagnosis Date  ? Anemia   ? Anxiety   ? Asthma   ? CHF (congestive heart failure) ([place])   ? Complication from renal dialysis device 11/02/2013  ? Complication of anesthesia 2017  ? according to pt and spouse pt  was moving around and cough while under and pt had difficulty waking up so the anesthesia had to be reversed. and pt admitted to ICU.   ? COPD (chronic obstructive pulmonary disease) ([place])   ? Diabetes mellitus without complication ([place])   ? Type II - Patient states he does not have diabetes, "they said sometimes when you start dialysis you don't have diabetes any longer"   ? ESOPHAGEAL VARICES 10/04/2008  ? Qualifier: Diagnosis of  By: Fuller Plan MD Lamont Snowball T   ? ESRD   ? on HD, T-TH-Sat - [place]  ? Hemiparesis due to old stroke [facility])   ? left  ? Hypertension   ? Hx, not current problems, no meds  ? LV dysfunction   ? EF 25-30% by echo 07/2011  ? 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 due to stroke  ? MR (mitral regurgitation)   ? moderate to severe, echo 07/2011  ? Peripheral vascular disease ([place])   ? Shortness of breath   ? with exertion  ? Stroke [facility])   ? TIA's-left sided weakness  ? Tobacco abuse   ?  ?Family History  ?Problem Relation Age of Onset  ? Hypertension Mother   ? Varicose Veins Mother   ? Diabetes Paternal Grandmother   ? Cancer Paternal Grandfather   ? CAD Paternal  Uncle   ?  ?Past Surgical History:  ?Procedure Laterality Date  ? ABDOMINAL AORTOGRAM W/LOWER EXTREMITY Bilateral 07/21/2018  ? Procedure: ABDOMINAL AORTOGRAM W/LOWER EXTREMITY;  Surgeon: Waynetta Sandy, MD;  Location: [place] CV [facility];  Service: Cardiovascular;  Laterality: Bilateral;  ? AMPUTATION Left 11/05/2018  ? Procedure: AMPUTATION SECOND TOE LEFT FOOT;  Surgeon: Waynetta Sandy, MD;  Location: [place];  Service: Vascular;  Laterality: Left;  ? AMPUTATION Left 02/13/2021  ? Procedure: Left small finger AMPUTATION DIGIT;  Surgeon: Sherilyn Cooter, MD;  Location: [place];  Service: Orthopedics;  Laterality: Left;  ? AV FISTULA PLACEMENT    ? CARDIAC CATHETERIZATION    ? [place] [facility]  ? COLONOSCOPY W/ BIOPSIES AND POLYPECTOMY    ? FISTULA SUPERFICIALIZATION Left 11/10/2013  ? Procedure: FISTULA PLICATION;   Surgeon: Serafina [place], MD;  Location: [place];  Service: Vascular;  Laterality: Left;  ? KIDNEY TRANSPLANT    ? 2011 rejected kidney 2012 back on dialysis  ? LEFT AND RIGHT HEART CATHETERIZATION WITH CORONARY ANGIOGRAM N/A 10/09/2013  ? Procedure: LEFT AND RIGHT HEART CATHETERIZATION WITH CORONARY ANGIOGRAM;  Surgeon: Burnell Blanks, MD;  Location: [facility] CATH LAB;  Service: Cardiovascular;  Laterality: N/A;  ? PERIPHERAL VASCULAR INTERVENTION Left 07/21/2018  ? Procedure: PERIPHERAL VASCULAR INTERVENTION;  Surgeon: Waynetta [place], MD;  Location: [place] CV LAB;  Service: Cardiovascular;  Laterality: Left;  Popliteal  ? REVISON OF ARTERIOVENOUS FISTULA Left 08/26/2013  ? Procedure: EXCISION OF ERODED SKIN AND EXPLORATION OF MAIN LEFT UPPER ARM AV FISTULA;  Surgeon: Rosetta Posner, MD;  Location: [place];  Service: Vascular;  Laterality: Left;  ? REVISON OF ARTERIOVENOUS FISTULA Left 02/05/2014  ? Procedure: REPAIR OF ARTERIOVENOUS FISTULA ANEURYSM;  Surgeon: Serafina [place], MD;  Location: [place];  Service: Vascular;  Laterality: Left;  ? REVISON OF ARTERIOVENOUS FISTULA Left 03/10/2021  ? Procedure: LEFT ARM REVISION OF ARTERIOVENOUS FISTULA WITH PLICATION;  Surgeon: Waynetta [place], MD;  Location: [place];  Service: Vascular;  Laterality: Left;  ? SHUNTOGRAM N/A 11/05/2012  ? Procedure: Fistulogram;  Surgeon: Serafina [place], MD;  Location: [facility] C[facility] LAB;  Service: Cardiovascular;  Laterality: N/A;  ? TEE WITHOUT CARDIOVERSION  08/17/2011  ? Procedure: TRANSESOPHAGEAL ECHOCARDIOGRAM (TEE);  Surgeon: Larey Dresser, MD;  Location: [place];  Service: Cardiovascular;  Laterality: N/A;  ? VIDEO ASSISTED THORACOSCOPY (VATS)/DECORTICATION  08/10/11  ? ?Social History  ? ?Occupational History  ? Occupation: Teacher, music  ?  Employer: DISABLED  ?Tobacco Use  ? Smoking status: Former  ?  Packs/day: 0.50  ?  Years: 15.00  ?  Pack years: 7.50  ?  Types: Cigarettes  ?  Quit date: 2014  ?  Years  since quitting: 9.2  ? Smokeless tobacco: Never  ?Vaping Use  ? Vaping Use: Never used  ?Substance and Sexual Activity  ? Alcohol use: No  ?  Alcohol/week: 0.0 standard drinks  ? Drug use: No  ? Sexual activity: Not Currently  ? ? ? ?

## 2021-03-29 ENCOUNTER — Other Ambulatory Visit: Payer: Self-pay | Admitting: Nurse Practitioner

## 2021-03-29 ENCOUNTER — Ambulatory Visit: Payer: Medicare Other | Admitting: Nurse Practitioner

## 2021-03-29 DIAGNOSIS — Z1211 Encounter for screening for malignant neoplasm of colon: Secondary | ICD-10-CM

## 2021-03-31 ENCOUNTER — Telehealth: Payer: Self-pay | Admitting: Nurse Practitioner

## 2021-03-31 NOTE — Telephone Encounter (Signed)
Copied from [place] [id number]. Topic: General - Other >> Mar 31, 2021 12:59 PM Yvette Rack wrote: Reason for CRM: Legrand Como with [facility] reported pt missed appt. Cb# [phone]

## 2021-04-10 NOTE — Progress Notes (Deleted)
? ? ?  Postoperative Access Visit ? ? ?History of Present Illness  ? ?Nathaniel White is a 61 y.o. year old male who presents for postoperative follow-up for: Revision of left arm AV fistula with plication on 4/0/98 by Dr. Donzetta Matters. He presents for wound check today. The patient's wounds are *** healed.  The patient notes *** steal symptoms.  He has had multiple plications and revisions of his left AV fistula over the past 10 years. Original fistula created in *** ? ? ?Physical Examination  ?There were no vitals filed for this visit. ?There is no height or weight on file to calculate BMI. ? ?{side of body:[id number]} arm Incision is *** healed, *** radial pulse, hand grip is ***/5, sensation in digits is *** intact, ***palpable thrill, bruit can *** be auscultated   ? ? ?Medical Decision Making  ? ?[name] is a 61 y.o. year old male who presents s/p Revision of left arm AV fistula with plication on 01/08/89 by Dr. Donzetta Matters. ? ?Patent *** without signs or symptoms of steal syndrome ?The patient's access will be ready for use *** ?***The patient's tunneled dialysis catheter can be removed when Nephrology is comfortable with the performance of the *** ?The patient may follow up on a prn basis ? ? ?Karoline Caldwell, PA-C ?[facility] ?Office: [phone] ? ?Clinic MD: Cain/ Scot Dock  ?

## 2021-04-12 ENCOUNTER — Ambulatory Visit: Payer: Medicare Other

## 2021-04-21 ENCOUNTER — Other Ambulatory Visit: Payer: Self-pay | Admitting: Nurse Practitioner

## 2021-04-21 NOTE — Telephone Encounter (Signed)
Medication Refill - Medication: apixaban (ELIQUIS) 2.5 MG TABS tablet ? ?Has the patient contacted their pharmacy? NoStanton Kidney nurse [facility] ? ?(Agent: If no, request that the patient contact the pharmacy for the refill. If patient does not wish to contact the pharmacy document the reason why and proceed with request.) ? ? ?Preferred Pharmacy (with phone number or street name):  ?[facility] [place] [place]  ?[place] [address]  ?Phone: [phone] Fax: [phone]  ?Hours: Not open 24 hours  ? ?Has the patient been seen for an appointment in the last year OR does the patient have an upcoming appointment? Yes.   ? ?Agent: Please be advised that RX refills may take up to 3 business days. We ask that you follow-up with your pharmacy.  ?

## 2021-04-21 NOTE — Telephone Encounter (Signed)
Requested medication (s) are due for refill today: yes ? ?Requested medication (s) are on the active medication list: yes ? ?Last refill:  03/15/21 #60 0 refills ? ?Future visit scheduled: yes in 1 month  ? ?Notes to clinic:  last ordered by Izora Ribas , DO . Do you want to order Rx? Refill order requested by nurse at [facility] ? ? ?  ?Requested Prescriptions  ?Pending Prescriptions Disp Refills  ? apixaban (ELIQUIS) 2.5 MG TABS tablet 60 tablet 0  ?  Sig: Take 1 tablet (2.5 mg total) by mouth 2 (two) times daily.  ?  ? Hematology:  Anticoagulants - apixaban Failed - 04/21/2021  1:53 PM  ?  ?  Failed - PLT in normal range and within 360 days  ?  Platelets  ?Date Value Ref Range Status  ?03/23/2021 73 (LL) 150 - 450 x10E3/uL Final  ?  ?  ?  ?  Failed - Cr in normal range and within 360 days  ?  Creatinine  ?Date Value Ref Range Status  ?01/17/2015 9.9 (HH) 0.7 - 1.3 mg/dL Final  ? ?Creatinine, Ser  ?Date Value Ref Range Status  ?03/15/2021 7.67 (H) 0.61 - 1.24 mg/dL Final  ?  ?  ?  ?  Passed - HGB in normal range and within 360 days  ?  Hemoglobin  ?Date Value Ref Range Status  ?03/23/2021 13.1 13.0 - 17.7 g/dL Final  ? ?HGB  ?Date Value Ref Range Status  ?01/17/2015 11.5 (L) 13.0 - 17.1 g/dL Final  ?  ?  ?  ?  Passed - HCT in normal range and within 360 days  ?  HCT  ?Date Value Ref Range Status  ?01/17/2015 35.7 (L) 38.4 - 49.9 % Final  ? ?Hematocrit  ?Date Value Ref Range Status  ?03/23/2021 39.7 37.5 - 51.0 % Final  ?  ?  ?  ?  Passed - AST in normal range and within 360 days  ?  AST  ?Date Value Ref Range Status  ?03/15/2021 17 15 - 41 U/L Final  ?01/17/2015 17 5 - 34 U/L Final  ?  ?  ?  ?  Passed - ALT in normal range and within 360 days  ?  ALT  ?Date Value Ref Range Status  ?03/15/2021 <5 0 - 44 U/L Final  ?01/17/2015 9 0 - 55 U/L Final  ?  ?  ?  ?  Passed - Valid encounter within last 12 months  ?  Recent Outpatient Visits   ? ?      ? 2 months ago Left hand pain  ? [place], [place], [place]  ? 3 months ago Left hand pain  ? [facility] at [facility], Clyde Canterbury, MD  ? 3 months ago Left hand pain  ? [facility] at [facility], Kriste Basque, NP  ? 8 months ago Primary hypertension  ? [place], [place] W, NP  ? 2 years ago Chronic obstructive pulmonary disease, unspecified COPD type ([place])  ? [place] Elsie Stain, MD  ? ?  ?  ?Future Appointments   ? ?        ? In 1 month Gildardo Pounds, NP [place]  ? In 3 months [place], Annita Brod, MD [place] [facility], [facility]  ? ?  ? ?  ?  ?  ? ?

## 2021-04-24 ENCOUNTER — Other Ambulatory Visit: Payer: Self-pay | Admitting: Nurse Practitioner

## 2021-04-24 NOTE — Telephone Encounter (Signed)
Mary from [facility] calling back about patient's medication refill of apixaban (ELIQUIS) 2.5 MG TABS tablet ?Patient is completely out of medication now.   ?[id number] [place], [place] AT [facility] Phone:  [phone]  ?Fax:  [phone]  ?  ? ? ?

## 2021-04-25 MED ORDER — APIXABAN 2.5 MG PO TABS: 2.5000 mg | ORAL_TABLET | Freq: Two times a day (BID) | ORAL | 5 refills | Status: DC

## 2021-04-25 NOTE — Telephone Encounter (Signed)
Prescription refill request for Eliquis received. ?Indication: Afib  ?Last office visit:03/23/21 [place])  ?Scr: 7.67 (03/15/21) ?Age: 61 ?Weight: 75.8kg ? ?Appropriate dose and refill sent to requested pharmacy.  ?

## 2021-05-17 ENCOUNTER — Encounter: Payer: Self-pay | Admitting: Vascular Surgery

## 2021-05-17 ENCOUNTER — Ambulatory Visit (INDEPENDENT_AMBULATORY_CARE_PROVIDER_SITE_OTHER): Payer: Medicare Other | Admitting: Vascular Surgery

## 2021-05-17 VITALS — BP 108/61 | HR 58 | Temp 98.4°F | Resp 20 | Ht 69.0 in | Wt 167.0 lb

## 2021-05-17 DIAGNOSIS — N186 End stage renal disease: Secondary | ICD-10-CM

## 2021-05-17 NOTE — Progress Notes (Signed)
?  ? ?  Subjective:  ?  ? Patient ID: Nathaniel White, male   DOB: 09-05-1960, 61 y.o.   MRN: [id number] ? ?HPI 61 year old male status post revision of left arm AV fistula is complicated by loss of pulse during the procedure.  He was subsequently recovered and was discharged home.  He is on dialysis Tuesdays, Thursdays and Saturdays he is currently using the fistula still has staples in place.  He has no complaints related to today's visit. ? ? ?Review of Systems ?No issues ?   ?Objective:  ? Physical Exam ?Vitals:  ? 05/17/21 0900  ?BP: 108/61  ?Pulse: (!) 58  ?Resp: 20  ?Temp: 98.4 ?F (36.9 ?C)  ?SpO2: 96%  ?Awake alert oriented ?Left arm fistula with strong thrill healing well with staples removed today ? ? ?   ?Assessment:  ?   ?61 year old male status post revision left arm AV fistula.  Staples removed today. ?   ?Plan:  ?   ?He can continue to use the fistula and will follow-up on an as-needed basis. ?   ?Adellyn Capek C. Donzetta Matters, MD ?[facility] ?Office: [phone] ?Pager: [phone] ? ?

## 2021-06-07 ENCOUNTER — Other Ambulatory Visit: Payer: Self-pay

## 2021-06-07 ENCOUNTER — Ambulatory Visit
Admission: RE | Admit: 2021-06-07 | Discharge: 2021-06-07 | Disposition: A | Discharge status: Home / Self Care | Payer: Medicare Other | Source: Ambulatory Visit | Attending: Nurse Practitioner | Admitting: Nurse Practitioner

## 2021-06-07 ENCOUNTER — Ambulatory Visit: Payer: Medicare Other | Attending: Nurse Practitioner | Admitting: Nurse Practitioner

## 2021-06-07 ENCOUNTER — Encounter: Payer: Self-pay | Admitting: Nurse Practitioner

## 2021-06-07 VITALS — BP 106/67 | HR 77 | Ht 69.0 in

## 2021-06-07 DIAGNOSIS — Z992 Dependence on renal dialysis: Secondary | ICD-10-CM | POA: Diagnosis not present

## 2021-06-07 DIAGNOSIS — E785 Hyperlipidemia, unspecified: Secondary | ICD-10-CM | POA: Insufficient documentation

## 2021-06-07 DIAGNOSIS — R059 Cough, unspecified: Secondary | ICD-10-CM | POA: Insufficient documentation

## 2021-06-07 DIAGNOSIS — Z1211 Encounter for screening for malignant neoplasm of colon: Secondary | ICD-10-CM | POA: Diagnosis not present

## 2021-06-07 DIAGNOSIS — K921 Melena: Secondary | ICD-10-CM | POA: Insufficient documentation

## 2021-06-07 DIAGNOSIS — Z8701 Personal history of pneumonia (recurrent): Secondary | ICD-10-CM | POA: Insufficient documentation

## 2021-06-07 DIAGNOSIS — Z7951 Long term (current) use of inhaled steroids: Secondary | ICD-10-CM | POA: Diagnosis not present

## 2021-06-07 DIAGNOSIS — J449 Chronic obstructive pulmonary disease, unspecified: Secondary | ICD-10-CM | POA: Diagnosis not present

## 2021-06-07 DIAGNOSIS — F419 Anxiety disorder, unspecified: Secondary | ICD-10-CM | POA: Diagnosis not present

## 2021-06-07 DIAGNOSIS — I10 Essential (primary) hypertension: Secondary | ICD-10-CM | POA: Insufficient documentation

## 2021-06-07 DIAGNOSIS — N186 End stage renal disease: Secondary | ICD-10-CM | POA: Diagnosis not present

## 2021-06-07 DIAGNOSIS — Z79899 Other long term (current) drug therapy: Secondary | ICD-10-CM | POA: Insufficient documentation

## 2021-06-07 MED ORDER — PREDNISONE 20 MG PO TABS
20.0000 mg | ORAL_TABLET | Freq: Every day | ORAL | 0 refills | Status: AC
  Filled 2021-06-07: qty 5, 5d supply, fill #0

## 2021-06-07 MED ORDER — ALBUTEROL SULFATE HFA 108 (90 BASE) MCG/ACT IN AERS
2.0000 | INHALATION_SPRAY | Freq: Four times a day (QID) | RESPIRATORY_TRACT | 2 refills | Status: DC | PRN
  Filled 2021-06-07: qty 8.5, 25d supply, fill #0

## 2021-06-07 MED ORDER — FLUTICASONE-SALMETEROL 250-50 MCG/ACT IN AEPB
1.0000 | INHALATION_SPRAY | Freq: Two times a day (BID) | RESPIRATORY_TRACT | 6 refills | Status: DC
  Filled 2021-06-07: qty 60, 30d supply, fill #0

## 2021-06-07 MED ORDER — MONTELUKAST SODIUM 10 MG PO TABS
10.0000 mg | ORAL_TABLET | Freq: Every day | ORAL | 3 refills | Status: DC
  Filled 2021-06-07: qty 90, 90d supply, fill #0

## 2021-06-07 MED ORDER — HYDROXYZINE HCL 10 MG PO TABS
10.0000 mg | ORAL_TABLET | Freq: Three times a day (TID) | ORAL | 0 refills | Status: DC | PRN
  Filled 2021-06-07: qty 60, 20d supply, fill #0

## 2021-06-07 NOTE — Progress Notes (Signed)
Assessment & Plan:  Nathaniel White was seen today for cough.  Diagnoses and all orders for this visit:  Primary hypertension -     CMP14+EGFR  Chronic obstructive pulmonary disease, unspecified COPD type (HCC) -     albuterol (PROAIR HFA) 108 (90 Base) MCG/ACT inhaler; Inhale 2 puffs into the lungs every 6 (six) hours as needed for wheezing or shortness of breath. -     montelukast (SINGULAIR) 10 MG tablet; Take 1 tablet (10 mg total) by mouth at bedtime. -     fluticasone-salmeterol (ADVAIR DISKUS) 250-50 MCG/ACT AEPB; Inhale 1 puff into the lungs in the morning and at bedtime. -     predniSONE (DELTASONE) 20 MG tablet; Take 1 tablet (20 mg total) by mouth daily with breakfast for 5 days.  History of pneumonia -     DG Chest 2 View; Future  ESRD (end stage renal disease) on dialysis ([place]) -     CBC with Differential  Dyslipidemia, goal LDL below 100 -     Lipid panel  Anxiety -     hydrOXYzine (ATARAX) 10 MG tablet; Take 1 tablet (10 mg total) by mouth 3 (three) times daily as needed. For anxiety  HEMATOCHEZIA -     Ambulatory referral to Gastroenterology  Colon cancer screening -     Ambulatory referral to Gastroenterology    Patient has been counseled on age-appropriate routine health concerns for screening and prevention. These are reviewed and up-to-date. Referrals have been placed accordingly. Immunizations are up-to-date or declined.    Subjective:   Chief Complaint  Patient presents with   Cough   HPI Nathaniel White 61 y.o. male presents to office today for chronic productive cough He is accompanied by his roommate Erlene Quan today who is assisting with providing pertinent health history,   He has a past medical history of Anemia, Afib (taking Eliquis), Thrombocytopenia, Anxiety, Asthma, CHF, Complication from renal dialysis device (11/02/2013),  COPD, DM 2,  ESOPHAGEAL VARICES (10/04/2008), ESRD, Failed renal transplant in 2010, Left sided Hemiparesis due to  old stroke, Hypertension, LV dysfunction, Memory loss due to medical condition, MR, Peripheral vascular disease, NICM with LVEF 30-35%, and Tobacco abuse.   COUGH Patient presents for presents evaluation of productive cough with sputum described as thick and tenacious . Symptoms began several months ago and are waxing and waning since that time.  Past history is significant for COPD and PNA. He endorses adherence using his advair and prn albuterol. He has not been taking his singulair.  HTN Blood pressure is well controlled  BP Readings from Last 3 Encounters:  06/07/21 106/67  05/17/21 108/61  03/23/21 122/60     Anxiety Endorses difficulty sleeping at night due to stress and anxiety. Also has trouble resting during the day. Roommate states "he won't sit still".    06/07/2021    3:52 PM 12/27/2020    3:59 PM 08/08/2018    1:39 PM  GAD 7 : Generalized Anxiety Score  Nervous, Anxious, on Edge 0 0 1  Control/stop worrying 0 0 0  Worry too much - different things 0 0 0  Trouble relaxing 0 1 0  Restless 0 1 0  Easily annoyed or irritable 1 1 0  Afraid - awful might happen 0 1 0  Total GAD 7 Score '1 4 1       ' Review of Systems  Constitutional:  Negative for fever, malaise/fatigue and weight loss.  HENT: Negative.  Negative for nosebleeds.  Eyes: Negative.  Negative for blurred vision, double vision and photophobia.  Respiratory:  Positive for cough, sputum production and shortness of breath.   Cardiovascular: Negative.  Negative for chest pain, palpitations and leg swelling.  Gastrointestinal:  Positive for blood in stool. Negative for heartburn, nausea and vomiting.  Musculoskeletal: Negative.  Negative for myalgias.  Neurological:  Positive for weakness. Negative for dizziness, focal weakness, seizures and headaches.  Psychiatric/Behavioral:  Negative for depression and suicidal ideas. The patient is nervous/anxious and has insomnia.    Past Medical History:  Diagnosis Date    Anemia    Anxiety    Asthma    CHF (congestive heart failure) ([place])    Complication from renal dialysis device 50/93/2671   Complication of anesthesia 2017   according to pt and spouse pt was moving around and cough while under and pt had difficulty waking up so the anesthesia had to be reversed. and pt admitted to ICU.    COPD (chronic obstructive pulmonary disease) (HCC)    Diabetes mellitus without complication ([place])    Type II - Patient states he does not have diabetes, "they said sometimes when you start dialysis you don't have diabetes any longer"    ESOPHAGEAL VARICES 10/04/2008   Qualifier: Diagnosis of  By: Fuller Plan MD Lamont Snowball T    ESRD    on HD, T-TH-Sat - [place]   Hemiparesis due to old stroke [facility])    left   Hypertension    Hx, not current problems, no meds   LV dysfunction    EF 25-30% by echo 07/2011   Memory loss due to medical condition    due to stroke   MR (mitral regurgitation)    moderate to severe, echo 07/2011   Peripheral vascular disease ([place])    Shortness of breath    with exertion   Stroke ([place])    TIA's-left sided weakness   Tobacco abuse     Past Surgical History:  Procedure Laterality Date   ABDOMINAL AORTOGRAM W/LOWER EXTREMITY Bilateral 07/21/2018   Procedure: ABDOMINAL AORTOGRAM W/LOWER EXTREMITY;  Surgeon: Waynetta Sandy, MD;  Location: [place] [facility];  Service: Cardiovascular;  Laterality: Bilateral;   AMPUTATION Left 11/05/2018   Procedure: AMPUTATION SECOND TOE LEFT FOOT;  Surgeon: Waynetta Sandy, MD;  Location: [place];  Service: Vascular;  Laterality: Left;   AMPUTATION Left 02/13/2021   Procedure: Left small finger AMPUTATION DIGIT;  Surgeon: Sherilyn Cooter, MD;  Location: [place];  Service: Orthopedics;  Laterality: Left;   AV FISTULA PLACEMENT     CARDIAC CATHETERIZATION     [facility]   COLONOSCOPY W/ BIOPSIES AND POLYPECTOMY     FISTULA SUPERFICIALIZATION Left 11/10/2013   Procedure: FISTULA  PLICATION;  Surgeon: Serafina Mitchell, MD;  Location: [facility];  Service: Vascular;  Laterality: Left;   KIDNEY TRANSPLANT     2011 rejected kidney 2012 back on dialysis   LEFT AND RIGHT HEART CATHETERIZATION WITH CORONARY ANGIOGRAM N/A 10/09/2013   Procedure: LEFT AND RIGHT HEART CATHETERIZATION WITH CORONARY ANGIOGRAM;  Surgeon: Burnell Blanks, MD;  Location: [facility] CATH LAB;  Service: Cardiovascular;  Laterality: N/A;   PERIPHERAL VASCULAR INTERVENTION Left 07/21/2018   Procedure: PERIPHERAL VASCULAR INTERVENTION;  Surgeon: Waynetta Sandy, MD;  Location: [place] CV LAB;  Service: Cardiovascular;  Laterality: Left;  Popliteal   REVISON OF ARTERIOVENOUS FISTULA Left 08/26/2013   Procedure: EXCISION OF ERODED SKIN AND EXPLORATION OF MAIN LEFT UPPER ARM AV FISTULA;  Surgeon: Rosetta Posner, MD;  Location: [place];  Service: Vascular;  Laterality: Left;   REVISON OF ARTERIOVENOUS FISTULA Left 02/05/2014   Procedure: REPAIR OF ARTERIOVENOUS FISTULA ANEURYSM;  Surgeon: Serafina Mitchell, MD;  Location: [facility] OR;  Service: Vascular;  Laterality: Left;   REVISON OF ARTERIOVENOUS FISTULA Left 03/10/2021   Procedure: LEFT ARM REVISION OF ARTERIOVENOUS FISTULA WITH PLICATION;  Surgeon: Waynetta Sandy, MD;  Location: [place];  Service: Vascular;  Laterality: Left;   SHUNTOGRAM N/A 11/05/2012   Procedure: Fistulogram;  Surgeon: Serafina Mitchell, MD;  Location: [facility] CATH LAB;  Service: Cardiovascular;  Laterality: N/A;   TEE WITHOUT CARDIOVERSION  08/17/2011   Procedure: TRANSESOPHAGEAL ECHOCARDIOGRAM (TEE);  Surgeon: Larey Dresser, MD;  Location[facility] ENDOSCOPY;  Service: Cardiovascular;  Laterality: N/A;   VIDEO ASSISTED THORACOSCOPY (VATS)/DECORTICATION  08/10/11    Family History  Problem Relation Age of Onset   Hypertension Mother    Varicose Veins Mother    Diabetes Paternal Grandmother    Cancer Paternal Grandfather    CAD Paternal Uncle     Social History Reviewed with no changes to be  made today.   Outpatient Medications Prior to Visit  Medication Sig Dispense Refill   acetaminophen (TYLENOL) 500 MG tablet Take 1 tablet (500 mg total) by mouth every 6 (six) hours as needed. (Patient taking differently: Take 500 mg by mouth every 6 (six) hours as needed for mild pain or headache.) 30 tablet 0   apixaban (ELIQUIS) 2.5 MG TABS tablet Take 1 tablet (2.5 mg total) by mouth 2 (two) times daily. 60 tablet 5   docusate sodium (COLACE) 100 MG capsule Take 1 capsule (100 mg total) by mouth 2 (two) times daily. 10 capsule 0   glucose blood test strip      midodrine (PROAMATINE) 10 MG tablet Place 1 tablet (10 mg total) into feeding tube 3 (three) times daily with meals. 90 tablet 0   VELPHORO 500 MG chewable tablet Chew 1,000 mg by mouth 2 (two) times daily after a meal.     VISINE ADVANCED RELIEF 0.05-0.1-1-1 % SOLN Place 1 drop into both eyes 2 (two) times daily as needed (for dryness).     albuterol (PROAIR HFA) 108 (90 Base) MCG/ACT inhaler Inhale 2 puffs into the lungs every 6 (six) hours as needed for wheezing or shortness of breath. 8.5 g 2   fluticasone-salmeterol (ADVAIR DISKUS) 250-50 MCG/ACT AEPB Inhale 1 puff into the lungs in the morning and at bedtime. 180 each 0   montelukast (SINGULAIR) 10 MG tablet Take 1 tablet (10 mg total) by mouth at bedtime. 90 tablet 3   No facility-administered medications prior to visit.    Allergies  Allergen Reactions   Codeine Shortness Of Breath   Dextromethorphan-Guaifenesin Shortness Of Breath   Iron Dextran Shortness Of Breath   Morphine And Related Shortness Of Breath       Objective:    BP 106/67   Pulse 77   Ht '5\' 9"'  (1.753 m)   SpO2 94%   BMI 24.66 kg/m  Wt Readings from Last 3 Encounters:  05/17/21 167 lb (75.8 kg)  03/23/21 167 lb (75.8 kg)  03/14/21 170 lb 10.2 oz (77.4 kg)    Physical Exam Vitals and nursing note reviewed.  Constitutional:      Appearance: He is well-developed.  HENT:     Head:  Normocephalic and atraumatic.  Cardiovascular:     Rate and Rhythm: Normal rate and regular rhythm.  Heart sounds: Normal heart sounds. No murmur heard.   No friction rub. No gallop.  Pulmonary:     Effort: Pulmonary effort is normal. No tachypnea or respiratory distress.     Breath sounds: Normal breath sounds. No decreased breath sounds, wheezing, rhonchi or rales.  Chest:     Chest wall: No tenderness.  Abdominal:     General: Bowel sounds are normal.     Palpations: Abdomen is soft.  Musculoskeletal:        General: Normal range of motion.     Cervical back: Normal range of motion.  Skin:    General: Skin is warm and dry.  Neurological:     Mental Status: He is alert and oriented to person, place, and time.     Coordination: Coordination normal.  Psychiatric:        Behavior: Behavior normal. Behavior is cooperative.        Thought Content: Thought content normal.        Judgment: Judgment normal.         Patient has been counseled extensively about nutrition and exercise as well as the importance of adherence with medications and regular follow-up. The patient was given clear instructions to go to ER or return to medical center if symptoms don't improve, worsen or new problems develop. The patient verbalized understanding.   Follow-up: Return in about 3 months (around 09/07/2021) for HTN.   Gildardo Pounds, FNP-BC [facility] [place], [place]   06/07/2021, 11:10 PM

## 2021-06-08 ENCOUNTER — Other Ambulatory Visit: Payer: Self-pay

## 2021-06-08 ENCOUNTER — Other Ambulatory Visit: Payer: Self-pay | Admitting: Nurse Practitioner

## 2021-06-08 ENCOUNTER — Telehealth: Payer: Self-pay | Admitting: Hematology

## 2021-06-08 DIAGNOSIS — D696 Thrombocytopenia, unspecified: Secondary | ICD-10-CM

## 2021-06-08 LAB — CMP14+EGFR
ALT: 7 IU/L (ref 0–44)
AST: 20 IU/L (ref 0–40)
Albumin/Globulin Ratio: 1.3 (ref 1.2–2.2)
Albumin: 4.2 g/dL (ref 3.8–4.9)
Alkaline Phosphatase: 85 IU/L (ref 44–121)
BUN/Creatinine Ratio: 5 — ABNORMAL LOW (ref 9–20)
BUN: 44 mg/dL — ABNORMAL HIGH (ref 6–24)
Bilirubin Total: 1 mg/dL (ref 0.0–1.2)
CO2: 27 mmol/L (ref 20–29)
Calcium: 9.5 mg/dL (ref 8.7–10.2)
Chloride: 92 mmol/L — ABNORMAL LOW (ref 96–106)
Creatinine, Ser: 8.86 mg/dL — ABNORMAL HIGH (ref 0.76–1.27)
Globulin, Total: 3.2 g/dL (ref 1.5–4.5)
Glucose: 92 mg/dL (ref 70–99)
Potassium: 4.3 mmol/L (ref 3.5–5.2)
Sodium: 141 mmol/L (ref 134–144)
Total Protein: 7.4 g/dL (ref 6.0–8.5)
eGFR: 6 mL/min/{1.73_m2} — ABNORMAL LOW (ref >59)

## 2021-06-08 LAB — CBC WITH DIFFERENTIAL/PLATELET
Basophils Absolute: 0.1 10*3/uL (ref 0.0–0.2)
Basos: 2 %
EOS (ABSOLUTE): 0.4 10*3/uL (ref 0.0–0.4)
Eos: 11 %
Hematocrit: 43.6 % (ref 37.5–51.0)
Hemoglobin: 14.3 g/dL (ref 13.0–17.7)
Immature Grans (Abs): 0 10*3/uL (ref 0.0–0.1)
Immature Granulocytes: 0 %
Lymphocytes Absolute: 0.8 10*3/uL (ref 0.7–3.1)
Lymphs: 26 %
MCH: 30.3 pg (ref 26.6–33.0)
MCHC: 32.8 g/dL (ref 31.5–35.7)
MCV: 92 fL (ref 79–97)
Monocytes Absolute: 0.3 10*3/uL (ref 0.1–0.9)
Monocytes: 10 %
Neutrophils Absolute: 1.7 10*3/uL (ref 1.4–7.0)
Neutrophils: 51 %
Platelets: 67 10*3/uL — CL (ref 150–450)
RBC: 4.72 x10E6/uL (ref 4.14–5.80)
RDW: 15.2 % (ref 11.6–15.4)
WBC: 3.3 10*3/uL — ABNORMAL LOW (ref 3.4–10.8)

## 2021-06-08 LAB — LIPID PANEL
Chol/HDL Ratio: 2.2 ratio (ref 0.0–5.0)
Cholesterol, Total: 130 mg/dL (ref 100–199)
HDL: 58 mg/dL (ref >39)
LDL Chol Calc (NIH): 59 mg/dL (ref 0–99)
Triglycerides: 58 mg/dL (ref 0–149)
VLDL Cholesterol Cal: 13 mg/dL (ref 5–40)

## 2021-06-08 NOTE — Telephone Encounter (Signed)
Scheduled appt per 6/1 referral. Pt is aware of appt date and time. Pt is aware to arrive 15 mins prior to appt time and to bring and updated insurance card. Pt is aware of appt location.

## 2021-06-15 DIAGNOSIS — R112 Nausea with vomiting, unspecified: Secondary | ICD-10-CM | POA: Insufficient documentation

## 2021-06-15 HISTORY — DX: Nausea with vomiting, unspecified: R11.2

## 2021-06-20 ENCOUNTER — Encounter (HOSPITAL_COMMUNITY)
Admission: EM | Disposition: A | Discharge status: Home / Self Care | Payer: Self-pay | Source: Home / Self Care | Attending: Family Medicine

## 2021-06-20 ENCOUNTER — Emergency Department (EMERGENCY_DEPARTMENT_HOSPITAL): Payer: Medicare Other | Admitting: Anesthesiology

## 2021-06-20 ENCOUNTER — Emergency Department (HOSPITAL_COMMUNITY): Payer: Medicare Other

## 2021-06-20 ENCOUNTER — Emergency Department (HOSPITAL_COMMUNITY): Payer: Medicare Other | Admitting: Anesthesiology

## 2021-06-20 ENCOUNTER — Encounter (HOSPITAL_COMMUNITY): Payer: Self-pay | Admitting: Certified Registered Nurse Anesthetist

## 2021-06-20 ENCOUNTER — Other Ambulatory Visit: Payer: Self-pay

## 2021-06-20 ENCOUNTER — Inpatient Hospital Stay (HOSPITAL_COMMUNITY)
Admission: EM | Admit: 2021-06-20 | Discharge: 2021-06-26 | DRG: 252 | Disposition: A | Discharge status: Home / Self Care | Payer: Medicare Other | Attending: Family Medicine | Admitting: Family Medicine

## 2021-06-20 DIAGNOSIS — R0602 Shortness of breath: Secondary | ICD-10-CM | POA: Diagnosis present

## 2021-06-20 DIAGNOSIS — J9601 Acute respiratory failure with hypoxia: Secondary | ICD-10-CM | POA: Diagnosis present

## 2021-06-20 DIAGNOSIS — Z940 Kidney transplant status: Secondary | ICD-10-CM | POA: Diagnosis not present

## 2021-06-20 DIAGNOSIS — J984 Other disorders of lung: Secondary | ICD-10-CM

## 2021-06-20 DIAGNOSIS — J810 Acute pulmonary edema: Secondary | ICD-10-CM | POA: Diagnosis not present

## 2021-06-20 DIAGNOSIS — D696 Thrombocytopenia, unspecified: Secondary | ICD-10-CM | POA: Diagnosis present

## 2021-06-20 DIAGNOSIS — Z20822 Contact with and (suspected) exposure to covid-19: Secondary | ICD-10-CM | POA: Diagnosis present

## 2021-06-20 DIAGNOSIS — Z992 Dependence on renal dialysis: Secondary | ICD-10-CM | POA: Diagnosis not present

## 2021-06-20 DIAGNOSIS — Z7901 Long term (current) use of anticoagulants: Secondary | ICD-10-CM

## 2021-06-20 DIAGNOSIS — I472 Ventricular tachycardia, unspecified: Secondary | ICD-10-CM | POA: Diagnosis present

## 2021-06-20 DIAGNOSIS — J9621 Acute and chronic respiratory failure with hypoxia: Secondary | ICD-10-CM

## 2021-06-20 DIAGNOSIS — M898X9 Other specified disorders of bone, unspecified site: Secondary | ICD-10-CM | POA: Diagnosis present

## 2021-06-20 DIAGNOSIS — H539 Unspecified visual disturbance: Secondary | ICD-10-CM | POA: Diagnosis present

## 2021-06-20 DIAGNOSIS — T82838A Hemorrhage of vascular prosthetic devices, implants and grafts, initial encounter: Secondary | ICD-10-CM | POA: Diagnosis present

## 2021-06-20 DIAGNOSIS — N186 End stage renal disease: Secondary | ICD-10-CM | POA: Diagnosis present

## 2021-06-20 DIAGNOSIS — E11649 Type 2 diabetes mellitus with hypoglycemia without coma: Secondary | ICD-10-CM | POA: Diagnosis present

## 2021-06-20 DIAGNOSIS — Z23 Encounter for immunization: Secondary | ICD-10-CM | POA: Diagnosis present

## 2021-06-20 DIAGNOSIS — E1151 Type 2 diabetes mellitus with diabetic peripheral angiopathy without gangrene: Secondary | ICD-10-CM | POA: Diagnosis present

## 2021-06-20 DIAGNOSIS — E119 Type 2 diabetes mellitus without complications: Secondary | ICD-10-CM

## 2021-06-20 DIAGNOSIS — I721 Aneurysm of artery of upper extremity: Secondary | ICD-10-CM | POA: Diagnosis present

## 2021-06-20 DIAGNOSIS — R410 Disorientation, unspecified: Secondary | ICD-10-CM

## 2021-06-20 DIAGNOSIS — E1122 Type 2 diabetes mellitus with diabetic chronic kidney disease: Secondary | ICD-10-CM | POA: Diagnosis not present

## 2021-06-20 DIAGNOSIS — R443 Hallucinations, unspecified: Secondary | ICD-10-CM | POA: Diagnosis not present

## 2021-06-20 DIAGNOSIS — I700 Atherosclerosis of aorta: Secondary | ICD-10-CM

## 2021-06-20 DIAGNOSIS — T829XXA Unspecified complication of cardiac and vascular prosthetic device, implant and graft, initial encounter: Secondary | ICD-10-CM

## 2021-06-20 DIAGNOSIS — J189 Pneumonia, unspecified organism: Secondary | ICD-10-CM | POA: Diagnosis present

## 2021-06-20 DIAGNOSIS — D631 Anemia in chronic kidney disease: Secondary | ICD-10-CM | POA: Diagnosis present

## 2021-06-20 DIAGNOSIS — Z833 Family history of diabetes mellitus: Secondary | ICD-10-CM

## 2021-06-20 DIAGNOSIS — J9600 Acute respiratory failure, unspecified whether with hypoxia or hypercapnia: Secondary | ICD-10-CM

## 2021-06-20 DIAGNOSIS — I2510 Atherosclerotic heart disease of native coronary artery without angina pectoris: Secondary | ICD-10-CM

## 2021-06-20 DIAGNOSIS — F419 Anxiety disorder, unspecified: Secondary | ICD-10-CM | POA: Diagnosis present

## 2021-06-20 DIAGNOSIS — I9589 Other hypotension: Secondary | ICD-10-CM | POA: Diagnosis present

## 2021-06-20 DIAGNOSIS — I132 Hypertensive heart and chronic kidney disease with heart failure and with stage 5 chronic kidney disease, or end stage renal disease: Secondary | ICD-10-CM | POA: Diagnosis present

## 2021-06-20 DIAGNOSIS — I5020 Unspecified systolic (congestive) heart failure: Secondary | ICD-10-CM

## 2021-06-20 DIAGNOSIS — T82510A Breakdown (mechanical) of surgically created arteriovenous fistula, initial encounter: Secondary | ICD-10-CM | POA: Diagnosis present

## 2021-06-20 DIAGNOSIS — I509 Heart failure, unspecified: Secondary | ICD-10-CM

## 2021-06-20 DIAGNOSIS — I8500 Esophageal varices without bleeding: Secondary | ICD-10-CM | POA: Diagnosis present

## 2021-06-20 DIAGNOSIS — K7469 Other cirrhosis of liver: Secondary | ICD-10-CM

## 2021-06-20 DIAGNOSIS — E8770 Fluid overload, unspecified: Secondary | ICD-10-CM | POA: Diagnosis not present

## 2021-06-20 DIAGNOSIS — J850 Gangrene and necrosis of lung: Secondary | ICD-10-CM | POA: Diagnosis present

## 2021-06-20 DIAGNOSIS — Z809 Family history of malignant neoplasm, unspecified: Secondary | ICD-10-CM

## 2021-06-20 DIAGNOSIS — J440 Chronic obstructive pulmonary disease with acute lower respiratory infection: Secondary | ICD-10-CM | POA: Diagnosis present

## 2021-06-20 DIAGNOSIS — I480 Paroxysmal atrial fibrillation: Secondary | ICD-10-CM

## 2021-06-20 DIAGNOSIS — Z885 Allergy status to narcotic agent status: Secondary | ICD-10-CM

## 2021-06-20 DIAGNOSIS — Z7951 Long term (current) use of inhaled steroids: Secondary | ICD-10-CM

## 2021-06-20 DIAGNOSIS — D649 Anemia, unspecified: Secondary | ICD-10-CM | POA: Diagnosis present

## 2021-06-20 DIAGNOSIS — Z8701 Personal history of pneumonia (recurrent): Secondary | ICD-10-CM

## 2021-06-20 DIAGNOSIS — Y712 Prosthetic and other implants, materials and accessory cardiovascular devices associated with adverse incidents: Secondary | ICD-10-CM | POA: Diagnosis present

## 2021-06-20 DIAGNOSIS — I69354 Hemiplegia and hemiparesis following cerebral infarction affecting left non-dominant side: Secondary | ICD-10-CM

## 2021-06-20 DIAGNOSIS — K7460 Unspecified cirrhosis of liver: Secondary | ICD-10-CM

## 2021-06-20 DIAGNOSIS — R413 Other amnesia: Secondary | ICD-10-CM | POA: Diagnosis present

## 2021-06-20 DIAGNOSIS — Z8249 Family history of ischemic heart disease and other diseases of the circulatory system: Secondary | ICD-10-CM

## 2021-06-20 DIAGNOSIS — L98499 Non-pressure chronic ulcer of skin of other sites with unspecified severity: Secondary | ICD-10-CM | POA: Diagnosis present

## 2021-06-20 DIAGNOSIS — I5022 Chronic systolic (congestive) heart failure: Secondary | ICD-10-CM | POA: Diagnosis present

## 2021-06-20 DIAGNOSIS — I639 Cerebral infarction, unspecified: Secondary | ICD-10-CM

## 2021-06-20 DIAGNOSIS — Z87891 Personal history of nicotine dependence: Secondary | ICD-10-CM

## 2021-06-20 DIAGNOSIS — Y832 Surgical operation with anastomosis, bypass or graft as the cause of abnormal reaction of the patient, or of later complication, without mention of misadventure at the time of the procedure: Secondary | ICD-10-CM | POA: Diagnosis present

## 2021-06-20 DIAGNOSIS — Z89422 Acquired absence of other left toe(s): Secondary | ICD-10-CM

## 2021-06-20 DIAGNOSIS — Z888 Allergy status to other drugs, medicaments and biological substances status: Secondary | ICD-10-CM

## 2021-06-20 DIAGNOSIS — R9389 Abnormal findings on diagnostic imaging of other specified body structures: Secondary | ICD-10-CM

## 2021-06-20 DIAGNOSIS — T82898A Other specified complication of vascular prosthetic devices, implants and grafts, initial encounter: Secondary | ICD-10-CM | POA: Diagnosis not present

## 2021-06-20 DIAGNOSIS — I4729 Other ventricular tachycardia: Secondary | ICD-10-CM

## 2021-06-20 DIAGNOSIS — Z79899 Other long term (current) drug therapy: Secondary | ICD-10-CM

## 2021-06-20 DIAGNOSIS — E162 Hypoglycemia, unspecified: Secondary | ICD-10-CM

## 2021-06-20 DIAGNOSIS — I340 Nonrheumatic mitral (valve) insufficiency: Secondary | ICD-10-CM | POA: Diagnosis present

## 2021-06-20 HISTORY — PX: INSERTION OF DIALYSIS CATHETER: SHX1324

## 2021-06-20 HISTORY — PX: LIGATION OF ARTERIOVENOUS  FISTULA: SHX5948

## 2021-06-20 HISTORY — PX: ULTRASOUND GUIDANCE FOR VASCULAR ACCESS: SHX6516

## 2021-06-20 LAB — CBC
HCT: 41 % (ref 39.0–52.0)
HCT: 34.5 % — ABNORMAL LOW (ref 39.0–52.0)
HCT: 28.2 % — ABNORMAL LOW (ref 39.0–52.0)
HCT: 26.9 % — ABNORMAL LOW (ref 39.0–52.0)
Hemoglobin: 13.1 g/dL (ref 13.0–17.0)
Hemoglobin: 11 g/dL — ABNORMAL LOW (ref 13.0–17.0)
Hemoglobin: 9 g/dL — ABNORMAL LOW (ref 13.0–17.0)
Hemoglobin: 9 g/dL — ABNORMAL LOW (ref 13.0–17.0)
MCH: 30.5 pg (ref 26.0–34.0)
MCH: 31 pg (ref 26.0–34.0)
MCH: 30.3 pg (ref 26.0–34.0)
MCH: 30.6 pg (ref 26.0–34.0)
MCHC: 32 g/dL (ref 30.0–36.0)
MCHC: 31.9 g/dL (ref 30.0–36.0)
MCHC: 31.9 g/dL (ref 30.0–36.0)
MCHC: 33.5 g/dL (ref 30.0–36.0)
MCV: 95.6 fL (ref 80.0–100.0)
MCV: 97.2 fL (ref 80.0–100.0)
MCV: 94.9 fL (ref 80.0–100.0)
MCV: 91.5 fL (ref 80.0–100.0)
Platelets: 75 10*3/uL — ABNORMAL LOW (ref 150–400)
Platelets: 75 10*3/uL — ABNORMAL LOW (ref 150–400)
Platelets: 58 10*3/uL — ABNORMAL LOW (ref 150–400)
Platelets: 56 10*3/uL — ABNORMAL LOW (ref 150–400)
RBC: 4.29 MIL/uL (ref 4.22–5.81)
RBC: 3.55 MIL/uL — ABNORMAL LOW (ref 4.22–5.81)
RBC: 2.97 MIL/uL — ABNORMAL LOW (ref 4.22–5.81)
RBC: 2.94 MIL/uL — ABNORMAL LOW (ref 4.22–5.81)
RDW: 17.9 % — ABNORMAL HIGH (ref 11.5–15.5)
RDW: 17.4 % — ABNORMAL HIGH (ref 11.5–15.5)
RDW: 17.3 % — ABNORMAL HIGH (ref 11.5–15.5)
RDW: 17.6 % — ABNORMAL HIGH (ref 11.5–15.5)
WBC: 8.3 10*3/uL (ref 4.0–10.5)
WBC: 10.4 10*3/uL (ref 4.0–10.5)
WBC: 9 10*3/uL (ref 4.0–10.5)
WBC: 7.3 10*3/uL (ref 4.0–10.5)
nRBC: 0 % (ref 0.0–0.2)
nRBC: 0 % (ref 0.0–0.2)
nRBC: 0 % (ref 0.0–0.2)
nRBC: 0 % (ref 0.0–0.2)

## 2021-06-20 LAB — PREPARE RBC (CROSSMATCH)

## 2021-06-20 LAB — COMPREHENSIVE METABOLIC PANEL
ALT: 13 U/L (ref 0–44)
ALT: 9 U/L (ref 0–44)
AST: 18 U/L (ref 15–41)
AST: 15 U/L (ref 15–41)
Albumin: 3.4 g/dL — ABNORMAL LOW (ref 3.5–5.0)
Albumin: 2.7 g/dL — ABNORMAL LOW (ref 3.5–5.0)
Alkaline Phosphatase: 60 U/L (ref 38–126)
Alkaline Phosphatase: 39 U/L (ref 38–126)
Anion gap: 20 — ABNORMAL HIGH (ref 5–15)
Anion gap: 16 — ABNORMAL HIGH (ref 5–15)
BUN: 90 mg/dL — ABNORMAL HIGH (ref 6–20)
BUN: 87 mg/dL — ABNORMAL HIGH (ref 6–20)
CO2: 23 mmol/L (ref 22–32)
CO2: 20 mmol/L — ABNORMAL LOW (ref 22–32)
Calcium: 9.8 mg/dL (ref 8.9–10.3)
Calcium: 8.8 mg/dL — ABNORMAL LOW (ref 8.9–10.3)
Chloride: 94 mmol/L — ABNORMAL LOW (ref 98–111)
Chloride: 104 mmol/L (ref 98–111)
Creatinine, Ser: 11.5 mg/dL — ABNORMAL HIGH (ref 0.61–1.24)
Creatinine, Ser: 11.29 mg/dL — ABNORMAL HIGH (ref 0.61–1.24)
GFR, Estimated: 5 mL/min — ABNORMAL LOW (ref >60)
GFR, Estimated: 5 mL/min — ABNORMAL LOW (ref >60)
Glucose, Bld: 108 mg/dL — ABNORMAL HIGH (ref 70–99)
Glucose, Bld: 130 mg/dL — ABNORMAL HIGH (ref 70–99)
Potassium: 5.3 mmol/L — ABNORMAL HIGH (ref 3.5–5.1)
Potassium: 4.8 mmol/L (ref 3.5–5.1)
Sodium: 137 mmol/L (ref 135–145)
Sodium: 140 mmol/L (ref 135–145)
Total Bilirubin: 1.5 mg/dL — ABNORMAL HIGH (ref 0.3–1.2)
Total Bilirubin: 1.7 mg/dL — ABNORMAL HIGH (ref 0.3–1.2)
Total Protein: 7.2 g/dL (ref 6.5–8.1)
Total Protein: 5.2 g/dL — ABNORMAL LOW (ref 6.5–8.1)

## 2021-06-20 LAB — PROTIME-INR
INR: 1.4 — ABNORMAL HIGH (ref 0.8–1.2)
INR: 1.2 (ref 0.8–1.2)
Prothrombin Time: 16.5 seconds — ABNORMAL HIGH (ref 11.4–15.2)
Prothrombin Time: 15.3 seconds — ABNORMAL HIGH (ref 11.4–15.2)

## 2021-06-20 LAB — POCT I-STAT, CHEM 8
BUN: 86 mg/dL — ABNORMAL HIGH (ref 6–20)
Calcium, Ion: 0.97 mmol/L — ABNORMAL LOW (ref 1.15–1.40)
Chloride: 100 mmol/L (ref 98–111)
Creatinine, Ser: 12.7 mg/dL — ABNORMAL HIGH (ref 0.61–1.24)
Glucose, Bld: 118 mg/dL — ABNORMAL HIGH (ref 70–99)
HCT: 34 % — ABNORMAL LOW (ref 39.0–52.0)
Hemoglobin: 11.6 g/dL — ABNORMAL LOW (ref 13.0–17.0)
Potassium: 4.4 mmol/L (ref 3.5–5.1)
Sodium: 137 mmol/L (ref 135–145)
TCO2: 22 mmol/L (ref 22–32)

## 2021-06-20 LAB — MAGNESIUM: Magnesium: 1.7 mg/dL (ref 1.7–2.4)

## 2021-06-20 LAB — BASIC METABOLIC PANEL
Anion gap: 12 (ref 5–15)
BUN: 39 mg/dL — ABNORMAL HIGH (ref 6–20)
CO2: 26 mmol/L (ref 22–32)
Calcium: 8.4 mg/dL — ABNORMAL LOW (ref 8.9–10.3)
Chloride: 97 mmol/L — ABNORMAL LOW (ref 98–111)
Creatinine, Ser: 6.04 mg/dL — ABNORMAL HIGH (ref 0.61–1.24)
GFR, Estimated: 10 mL/min — ABNORMAL LOW (ref >60)
Glucose, Bld: 155 mg/dL — ABNORMAL HIGH (ref 70–99)
Potassium: 3.8 mmol/L (ref 3.5–5.1)
Sodium: 135 mmol/L (ref 135–145)

## 2021-06-20 LAB — HEPATITIS B CORE ANTIBODY, TOTAL: Hep B Core Total Ab: NONREACTIVE

## 2021-06-20 LAB — RESP PANEL BY RT-PCR (FLU A&B, COVID) ARPGX2
Influenza A by PCR: NEGATIVE
Influenza B by PCR: NEGATIVE
SARS Coronavirus 2 by RT PCR: NEGATIVE

## 2021-06-20 LAB — MRSA NEXT GEN BY PCR, NASAL: MRSA by PCR Next Gen: NOT DETECTED

## 2021-06-20 LAB — GLUCOSE, CAPILLARY
Glucose-Capillary: 126 mg/dL — ABNORMAL HIGH (ref 70–99)
Glucose-Capillary: 173 mg/dL — ABNORMAL HIGH (ref 70–99)
Glucose-Capillary: 131 mg/dL — ABNORMAL HIGH (ref 70–99)

## 2021-06-20 LAB — HEPATITIS B SURFACE ANTIGEN: Hepatitis B Surface Ag: NONREACTIVE

## 2021-06-20 LAB — HEPATITIS C ANTIBODY: HCV Ab: NONREACTIVE

## 2021-06-20 LAB — BRAIN NATRIURETIC PEPTIDE: B Natriuretic Peptide: 2645.2 pg/mL — ABNORMAL HIGH (ref 0.0–100.0)

## 2021-06-20 LAB — HEPATITIS B SURFACE ANTIBODY,QUALITATIVE: Hep B S Ab: REACTIVE — AB

## 2021-06-20 SURGERY — LIGATION OF ARTERIOVENOUS  FISTULA
Anesthesia: General | Site: Arm Upper

## 2021-06-20 MED ORDER — ONDANSETRON HCL 4 MG/2ML IJ SOLN
4.0000 mg | Freq: Four times a day (QID) | INTRAMUSCULAR | Status: DC | PRN
Start: 2021-06-20 — End: 2021-06-26

## 2021-06-20 MED ORDER — CHLORHEXIDINE GLUCONATE CLOTH 2 % EX PADS
6.0000 | MEDICATED_PAD | Freq: Every day | CUTANEOUS | Status: DC
  Administered 2021-06-21: 6 via TOPICAL

## 2021-06-20 MED ORDER — HEPARIN SODIUM (PORCINE) 1000 UNIT/ML IJ SOLN
INTRAMUSCULAR | Status: AC
  Administered 2021-06-20: 1000 [IU]
  Filled 2021-06-20: qty 4

## 2021-06-20 MED ORDER — SODIUM CHLORIDE 0.9 % IV SOLN: INTRAVENOUS | Status: DC | PRN

## 2021-06-20 MED ORDER — INSULIN ASPART 100 UNIT/ML IJ SOLN
0.0000 [IU] | Freq: Three times a day (TID) | INTRAMUSCULAR | Status: DC
  Administered 2021-06-20 – 2021-06-21 (×2): 1 [IU] via SUBCUTANEOUS

## 2021-06-20 MED ORDER — ACETAMINOPHEN 325 MG PO TABS
325.0000 mg | ORAL_TABLET | ORAL | Status: DC | PRN
  Administered 2021-06-24: 650 mg via ORAL
  Filled 2021-06-20 (×2): qty 2

## 2021-06-20 MED ORDER — SODIUM CHLORIDE 0.9 % IV SOLN
20.0000 ug | Freq: Once | INTRAVENOUS | Status: AC
  Administered 2021-06-20: 20 ug via INTRAVENOUS
  Filled 2021-06-20: qty 5

## 2021-06-20 MED ORDER — CEFAZOLIN SODIUM-DEXTROSE 2-4 GM/100ML-% IV SOLN: 2.0000 g | Freq: Three times a day (TID) | INTRAVENOUS | Status: DC

## 2021-06-20 MED ORDER — LIDOCAINE 2% (20 MG/ML) 5 ML SYRINGE
INTRAMUSCULAR | Status: DC | PRN
  Administered 2021-06-20: 80 mg via INTRAVENOUS

## 2021-06-20 MED ORDER — SUGAMMADEX SODIUM 200 MG/2ML IV SOLN
INTRAVENOUS | Status: DC | PRN
  Administered 2021-06-20: 100 mg via INTRAVENOUS
  Administered 2021-06-20: 200 mg via INTRAVENOUS

## 2021-06-20 MED ORDER — ALBUTEROL SULFATE HFA 108 (90 BASE) MCG/ACT IN AERS
INHALATION_SPRAY | RESPIRATORY_TRACT | Status: DC | PRN
  Administered 2021-06-20: 3 via RESPIRATORY_TRACT
  Administered 2021-06-20: 2 via RESPIRATORY_TRACT

## 2021-06-20 MED ORDER — DEXAMETHASONE SODIUM PHOSPHATE 10 MG/ML IJ SOLN
INTRAMUSCULAR | Status: DC | PRN
  Administered 2021-06-20: 4 mg via INTRAVENOUS

## 2021-06-20 MED ORDER — LABETALOL HCL 5 MG/ML IV SOLN: 10.0000 mg | INTRAVENOUS | Status: DC | PRN

## 2021-06-20 MED ORDER — ONDANSETRON HCL 4 MG/2ML IJ SOLN
INTRAMUSCULAR | Status: AC
  Administered 2021-06-20: 4 mg
  Filled 2021-06-20: qty 2

## 2021-06-20 MED ORDER — ROCURONIUM BROMIDE 100 MG/10ML IV SOLN
INTRAVENOUS | Status: DC | PRN
  Administered 2021-06-20: 80 mg via INTRAVENOUS

## 2021-06-20 MED ORDER — ALBUTEROL SULFATE HFA 108 (90 BASE) MCG/ACT IN AERS
INHALATION_SPRAY | RESPIRATORY_TRACT | Status: AC
  Filled 2021-06-20: qty 6.7

## 2021-06-20 MED ORDER — PHENOL 1.4 % MT LIQD: 1.0000 | OROMUCOSAL | Status: DC | PRN

## 2021-06-20 MED ORDER — DEXAMETHASONE SODIUM PHOSPHATE 10 MG/ML IJ SOLN
INTRAMUSCULAR | Status: AC
  Filled 2021-06-20: qty 1

## 2021-06-20 MED ORDER — METOPROLOL TARTRATE 5 MG/5ML IV SOLN: 2.0000 mg | INTRAVENOUS | Status: DC | PRN

## 2021-06-20 MED ORDER — PANTOPRAZOLE SODIUM 40 MG PO TBEC
40.0000 mg | DELAYED_RELEASE_TABLET | Freq: Every day | ORAL | Status: DC
  Administered 2021-06-20 – 2021-06-26 (×7): 40 mg via ORAL
  Filled 2021-06-20 (×7): qty 1

## 2021-06-20 MED ORDER — SENNOSIDES-DOCUSATE SODIUM 8.6-50 MG PO TABS: 1.0000 | ORAL_TABLET | Freq: Every evening | ORAL | Status: DC | PRN

## 2021-06-20 MED ORDER — CALCIUM CHLORIDE 10 % IV SOLN
INTRAVENOUS | Status: DC | PRN
  Administered 2021-06-20: 500 mg via INTRAVENOUS

## 2021-06-20 MED ORDER — ROCURONIUM BROMIDE 10 MG/ML (PF) SYRINGE
PREFILLED_SYRINGE | INTRAVENOUS | Status: AC
  Filled 2021-06-20: qty 10

## 2021-06-20 MED ORDER — PHENYLEPHRINE 80 MCG/ML (10ML) SYRINGE FOR IV PUSH (FOR BLOOD PRESSURE SUPPORT)
PREFILLED_SYRINGE | INTRAVENOUS | Status: AC
  Filled 2021-06-20: qty 10

## 2021-06-20 MED ORDER — HEPARIN SODIUM (PORCINE) 1000 UNIT/ML IJ SOLN
INTRAMUSCULAR | Status: DC | PRN
  Administered 2021-06-20: 3800 [IU]

## 2021-06-20 MED ORDER — ALBUTEROL SULFATE (2.5 MG/3ML) 0.083% IN NEBU
2.5000 mg | INHALATION_SOLUTION | Freq: Four times a day (QID) | RESPIRATORY_TRACT | Status: DC | PRN
  Administered 2021-06-20 – 2021-06-21 (×2): 2.5 mg via RESPIRATORY_TRACT
  Filled 2021-06-20 (×2): qty 3

## 2021-06-20 MED ORDER — OXYCODONE-ACETAMINOPHEN 5-325 MG PO TABS
1.0000 | ORAL_TABLET | ORAL | Status: DC | PRN
  Administered 2021-06-20 – 2021-06-21 (×4): 1 via ORAL
  Administered 2021-06-24 – 2021-06-25 (×2): 2 via ORAL
  Filled 2021-06-20: qty 1
  Filled 2021-06-20 (×3): qty 2
  Filled 2021-06-20 (×2): qty 1

## 2021-06-20 MED ORDER — MONTELUKAST SODIUM 10 MG PO TABS
10.0000 mg | ORAL_TABLET | Freq: Every day | ORAL | Status: DC
  Administered 2021-06-20 – 2021-06-25 (×6): 10 mg via ORAL
  Filled 2021-06-20 (×6): qty 1

## 2021-06-20 MED ORDER — ONDANSETRON HCL 4 MG/2ML IJ SOLN
INTRAMUSCULAR | Status: DC | PRN
  Administered 2021-06-20: 4 mg via INTRAVENOUS

## 2021-06-20 MED ORDER — HYDROMORPHONE HCL 1 MG/ML IJ SOLN
0.5000 mg | INTRAMUSCULAR | Status: DC | PRN
  Administered 2021-06-21: 1 mg via INTRAVENOUS
  Filled 2021-06-20: qty 1

## 2021-06-20 MED ORDER — ESMOLOL HCL 100 MG/10ML IV SOLN
INTRAVENOUS | Status: DC | PRN
  Administered 2021-06-20: 10 mg via INTRAVENOUS

## 2021-06-20 MED ORDER — ESMOLOL HCL 100 MG/10ML IV SOLN
INTRAVENOUS | Status: AC
  Filled 2021-06-20: qty 10

## 2021-06-20 MED ORDER — POTASSIUM CHLORIDE CRYS ER 20 MEQ PO TBCR
20.0000 meq | EXTENDED_RELEASE_TABLET | Freq: Once | ORAL | Status: DC
  Filled 2021-06-20: qty 2

## 2021-06-20 MED ORDER — ACETAMINOPHEN 325 MG RE SUPP: 325.0000 mg | RECTAL | Status: DC | PRN

## 2021-06-20 MED ORDER — SUCROFERRIC OXYHYDROXIDE 500 MG PO CHEW
1000.0000 mg | CHEWABLE_TABLET | Freq: Two times a day (BID) | ORAL | Status: DC
  Administered 2021-06-20 – 2021-06-26 (×10): 1000 mg via ORAL
  Filled 2021-06-20 (×16): qty 2

## 2021-06-20 MED ORDER — SODIUM CHLORIDE 0.9 % IV SOLN: 10.0000 mL/h | Freq: Once | INTRAVENOUS | Status: AC

## 2021-06-20 MED ORDER — PROTHROMBIN COMPLEX CONC HUMAN 500 UNITS IV KIT
3540.0000 [IU] | PACK | Status: AC
  Administered 2021-06-20: 3540 [IU] via INTRAVENOUS
  Filled 2021-06-20: qty 3040

## 2021-06-20 MED ORDER — ONDANSETRON HCL 4 MG/2ML IJ SOLN
INTRAMUSCULAR | Status: AC
  Filled 2021-06-20: qty 2

## 2021-06-20 MED ORDER — FENTANYL CITRATE (PF) 250 MCG/5ML IJ SOLN
INTRAMUSCULAR | Status: DC | PRN
  Administered 2021-06-20: 150 ug via INTRAVENOUS

## 2021-06-20 MED ORDER — CEFAZOLIN SODIUM-DEXTROSE 2-3 GM-%(50ML) IV SOLR
INTRAVENOUS | Status: DC | PRN
  Administered 2021-06-20: 2 g via INTRAVENOUS

## 2021-06-20 MED ORDER — ALUM & MAG HYDROXIDE-SIMETH 200-200-20 MG/5ML PO SUSP: 15.0000 mL | ORAL | Status: DC | PRN

## 2021-06-20 MED ORDER — HYDRALAZINE HCL 20 MG/ML IJ SOLN: 5.0000 mg | INTRAMUSCULAR | Status: DC | PRN

## 2021-06-20 MED ORDER — DOXERCALCIFEROL 4 MCG/2ML IV SOLN
5.0000 ug | INTRAVENOUS | Status: DC
Start: 2021-06-22 — End: 2021-06-26
  Administered 2021-06-23 – 2021-06-26 (×3): 5 ug via INTRAVENOUS
  Filled 2021-06-20 (×6): qty 4

## 2021-06-20 MED ORDER — PROPOFOL 10 MG/ML IV BOLUS
INTRAVENOUS | Status: DC | PRN
  Administered 2021-06-20: 50 mg via INTRAVENOUS

## 2021-06-20 MED ORDER — 0.9 % SODIUM CHLORIDE (POUR BTL) OPTIME
TOPICAL | Status: DC | PRN
  Administered 2021-06-20: 1000 mL

## 2021-06-20 MED ORDER — PHENYLEPHRINE 80 MCG/ML (10ML) SYRINGE FOR IV PUSH (FOR BLOOD PRESSURE SUPPORT)
PREFILLED_SYRINGE | INTRAVENOUS | Status: DC | PRN
  Administered 2021-06-20 (×5): 80 ug via INTRAVENOUS

## 2021-06-20 MED ORDER — ALBUTEROL SULFATE (2.5 MG/3ML) 0.083% IN NEBU
10.0000 mg/h | INHALATION_SOLUTION | Freq: Once | RESPIRATORY_TRACT | Status: AC
  Administered 2021-06-20: 10 mg/h via RESPIRATORY_TRACT
  Filled 2021-06-20: qty 12

## 2021-06-20 MED ORDER — PROPOFOL 10 MG/ML IV BOLUS
INTRAVENOUS | Status: AC
  Filled 2021-06-20: qty 20

## 2021-06-20 MED ORDER — HEPARIN 6000 UNIT IRRIGATION SOLUTION
Status: DC | PRN
  Administered 2021-06-20: 1

## 2021-06-20 MED ORDER — CALCIUM CHLORIDE 10 % IV SOLN
INTRAVENOUS | Status: AC
  Filled 2021-06-20: qty 10

## 2021-06-20 MED ORDER — SODIUM CHLORIDE 0.9 % IV BOLUS (SEPSIS)
1000.0000 mL | Freq: Once | INTRAVENOUS | Status: AC
  Administered 2021-06-20: 1000 mL via INTRAVENOUS

## 2021-06-20 MED ORDER — FENTANYL CITRATE (PF) 250 MCG/5ML IJ SOLN
INTRAMUSCULAR | Status: AC
  Filled 2021-06-20: qty 5

## 2021-06-20 MED ORDER — MIDODRINE HCL 5 MG PO TABS
10.0000 mg | ORAL_TABLET | Freq: Three times a day (TID) | ORAL | Status: DC
  Administered 2021-06-20 – 2021-06-26 (×19): 10 mg via ORAL
  Filled 2021-06-20 (×19): qty 2

## 2021-06-20 MED ORDER — ALBUMIN HUMAN 5 % IV SOLN: INTRAVENOUS | Status: DC | PRN

## 2021-06-20 MED ORDER — MOMETASONE FURO-FORMOTEROL FUM 200-5 MCG/ACT IN AERO
2.0000 | INHALATION_SPRAY | Freq: Two times a day (BID) | RESPIRATORY_TRACT | Status: DC
  Administered 2021-06-20 – 2021-06-21 (×2): 2 via RESPIRATORY_TRACT
  Filled 2021-06-20: qty 8.8

## 2021-06-20 MED ORDER — DOCUSATE SODIUM 100 MG PO CAPS
100.0000 mg | ORAL_CAPSULE | Freq: Two times a day (BID) | ORAL | Status: DC
  Administered 2021-06-20 – 2021-06-25 (×7): 100 mg via ORAL
  Filled 2021-06-20 (×8): qty 1

## 2021-06-20 SURGICAL SUPPLY — 65 items
ARMBAND PINK RESTRICT EXTREMIT (MISCELLANEOUS) ×4 IMPLANT
BAG COUNTER SPONGE SURGICOUNT (BAG) ×4 IMPLANT
BAG DECANTER FOR FLEXI CONT (MISCELLANEOUS) ×4 IMPLANT
BIOPATCH BLUE 3/4IN DISK W/1.5 (GAUZE/BANDAGES/DRESSINGS) ×1 IMPLANT
BIOPATCH RED 1 DISK 7.0 (GAUZE/BANDAGES/DRESSINGS) ×4 IMPLANT
BNDG ELASTIC 6X5.8 VLCR STR LF (GAUZE/BANDAGES/DRESSINGS) ×1 IMPLANT
CANISTER SUCT 3000ML PPV (MISCELLANEOUS) ×4 IMPLANT
CATH PALINDROME-P 23CM W/VT (CATHETERS) ×1 IMPLANT
CLIP LIGATING EXTRA MED SLVR (CLIP) ×1 IMPLANT
COVER PROBE W GEL 5X96 (DRAPES) ×4 IMPLANT
COVER SURGICAL LIGHT HANDLE (MISCELLANEOUS) ×4 IMPLANT
DERMABOND ADVANCED (GAUZE/BANDAGES/DRESSINGS) ×1
DERMABOND ADVANCED .7 DNX12 (GAUZE/BANDAGES/DRESSINGS) ×3 IMPLANT
DRAPE C-ARM 42X72 X-RAY (DRAPES) ×4 IMPLANT
DRAPE CHEST BREAST 15X10 FENES (DRAPES) ×4 IMPLANT
DRSG OPSITE POSTOP 4X6 (GAUZE/BANDAGES/DRESSINGS) ×1 IMPLANT
DRSG OPSITE POSTOP 4X8 (GAUZE/BANDAGES/DRESSINGS) ×1 IMPLANT
ELECT REM PT RETURN 9FT ADLT (ELECTROSURGICAL) ×4 IMPLANT
ELECTRODE REM PT RTRN 9FT ADLT (ELECTROSURGICAL) ×3 IMPLANT
GAUZE SPONGE 4X4 12PLY STRL (GAUZE/BANDAGES/DRESSINGS) ×4 IMPLANT
GLIDEWIRE ADV .035X180CM (WIRE) ×1 IMPLANT
GLOVE BIO SURGEON STRL SZ7.5 (GLOVE) ×4 IMPLANT
GLOVE BIOGEL PI IND STRL 8 (GLOVE) ×3 IMPLANT
GLOVE BIOGEL PI INDICATOR 8 (GLOVE) ×1
GLOVE SRG 8 PF TXTR STRL LF DI (GLOVE) ×3 IMPLANT
GLOVE SURG POLYISO LF SZ8 (GLOVE) IMPLANT
GLOVE SURG UNDER POLY LF SZ8 (GLOVE) ×4
GOWN STRL REUS W/ TWL LRG LVL3 (GOWN DISPOSABLE) ×6 IMPLANT
GOWN STRL REUS W/TWL 2XL LVL3 (GOWN DISPOSABLE) ×4 IMPLANT
GOWN STRL REUS W/TWL LRG LVL3 (GOWN DISPOSABLE) ×8
KIT BASIN OR (CUSTOM PROCEDURE TRAY) ×4 IMPLANT
KIT PALINDROME-P 55CM (CATHETERS) IMPLANT
KIT TURNOVER KIT B (KITS) ×4 IMPLANT
NDL 18GX1X1/2 (RX/OR ONLY) (NEEDLE) ×3 IMPLANT
NDL HYPO 25GX1X1/2 BEV (NEEDLE) ×3 IMPLANT
NEEDLE 18GX1X1/2 (RX/OR ONLY) (NEEDLE) ×4 IMPLANT
NEEDLE HYPO 25GX1X1/2 BEV (NEEDLE) ×4 IMPLANT
NS IRRIG 1000ML POUR BTL (IV SOLUTION) ×4 IMPLANT
PACK CV ACCESS (CUSTOM PROCEDURE TRAY) ×4 IMPLANT
PACK SURGICAL SETUP 50X90 (CUSTOM PROCEDURE TRAY) ×4 IMPLANT
PAD ARMBOARD 7.5X6 YLW CONV (MISCELLANEOUS) ×8 IMPLANT
SET MICROPUNCTURE 5F STIFF (MISCELLANEOUS) ×1 IMPLANT
SOAP 2 % CHG 4 OZ (WOUND CARE) ×4 IMPLANT
SPIKE FLUID TRANSFER (MISCELLANEOUS) ×4 IMPLANT
SPONGE T-LAP 18X18 ~~LOC~~+RFID (SPONGE) ×2 IMPLANT
STAPLER VISISTAT 35W (STAPLE) ×1 IMPLANT
SUT ETHILON 2 0 PSLX (SUTURE) ×1 IMPLANT
SUT ETHILON 3 0 PS 1 (SUTURE) ×4 IMPLANT
SUT MNCRL AB 4-0 PS2 18 (SUTURE) ×8 IMPLANT
SUT PROLENE 5 0 C 1 24 (SUTURE) ×5 IMPLANT
SUT PROLENE 6 0 BV (SUTURE) IMPLANT
SUT SILK 0 TIES 10X30 (SUTURE) ×4 IMPLANT
SUT SILK 3 0SH CR/8 30 (SUTURE) ×1 IMPLANT
SUT VIC AB 3-0 SH 27 (SUTURE) ×8
SUT VIC AB 3-0 SH 27X BRD (SUTURE) ×3 IMPLANT
SYR 10ML LL (SYRINGE) ×4 IMPLANT
SYR 20ML LL LF (SYRINGE) ×8 IMPLANT
SYR 5ML LL (SYRINGE) ×4 IMPLANT
SYR CONTROL 10ML LL (SYRINGE) ×4 IMPLANT
TOWEL GREEN STERILE (TOWEL DISPOSABLE) ×4 IMPLANT
TOWEL GREEN STERILE FF (TOWEL DISPOSABLE) ×4 IMPLANT
UNDERPAD 30X36 HEAVY ABSORB (UNDERPADS AND DIAPERS) ×4 IMPLANT
WATER STERILE IRR 1000ML POUR (IV SOLUTION) ×4 IMPLANT
WIRE AMPLATZ SS-J .035X180CM (WIRE) IMPLANT
WIRE BENTSON .035X145CM (WIRE) ×1 IMPLANT

## 2021-06-20 NOTE — Transfer of Care (Signed)
Immediate Anesthesia Transfer of Care Note  Patient: Nathaniel White  Procedure(s) Performed: LIGATION OF ARTERIOVENOUS  FISTULA (Left: Arm Upper) INSERTION OF DIALYSIS CATHETER ULTRASOUND GUIDANCE FOR VASCULAR ACCESS  Patient Location: ICU  Anesthesia Type:General  Level of Consciousness: awake, alert , patient cooperative and responds to stimulation  Airway & Oxygen Therapy: Patient Spontanous Breathing and Patient connected to nasal cannula oxygen  Post-op Assessment: Report given to RN, Post -op Vital signs reviewed and stable and Patient moving all extremities X 4  Post vital signs: Reviewed and stable  Last Vitals:  Vitals Value Taken Time  BP 123/69 06/20/21 0947  Temp    Pulse 105 06/20/21 0957  Resp 22 06/20/21 0957  SpO2 97 % 06/20/21 0957  Vitals shown include unvalidated device data.  Last Pain:  Vitals:   06/20/21 0404  TempSrc: Oral  PainSc:          Complications: No notable events documented.

## 2021-06-20 NOTE — ED Notes (Signed)
To the OR.

## 2021-06-20 NOTE — Op Note (Signed)
    NAME: MERVIL WACKER    MRN: [id number] DOB: [birth year]    DATE OF OPERATION: 06/20/2021  PREOP DIAGNOSIS:    Left arm fistula hemorrhage  POSTOP DIAGNOSIS:    Same  PROCEDURE:    Left arm brachiocephalic fistula ligation Left IJ 23 cm tunneled dialysis catheter placement  SURGEON: Broadus John  ASSIST: Paulo Fruit, PA  ANESTHESIA: General  EBL: 50 mL  INDICATIONS:    Nathaniel White is a 61 y.o. male who presented to [facility] in a coughing spell.  While being evaluated, his left arm fistula ruptured resulting in life-threatening hemorrhage.  He was taken to the operating room for fistula ligation, TDC placement as a lifesaving measure.  FINDINGS:   Left arm brachiocephalic fistula ulceration  TECHNIQUE:   Patient was brought to the OR laid in supine position.  General anesthesia was induced and the patient was prepped and draped in standard fashion.  The case began with two 2-0 nylon sutures placed circumferentially around the fistula for control.  Next, a tourniquet was brought onto the field and inflated to 300 mmHg.  Next, a longitudinal incision was made distal to the hemorrhage site and circumferential control of the fascia was controlled.  The fistula was clamped using vascular clamps and double suture ligated using 5-0 Prolene suture both proximally and distally.  Next, the tourniquet was removed and the ulceration site visualized.  A longitudinal incision was made along the ulceration site and nonviable dermis debrided.  Approximately the venous outflow of the fistula was oversewn using 5-0 Prolene suture.  The 2 incision sites were irrigated with hemostasis achieved using Kcentra, DDAVP, cautery.  They were closed using 3-0 Vicryl suture with staples at the skin.  Next, my attention turned to tunneled dialysis catheter placement. Using ultrasound guidance the left internal jugular vein was accessed with micropuncture technique.  Through the micropuncture  sheath a floppy J-wire was advanced into the superior vena cava.  A small incision was made around the skin access point.  A counterincision was made in the chest under the clavicle.  A 23 cm tunneled dialysis catheter was then tunneled under the skin, over the clavicle into the incision in the neck.  The access point was serially dilated under direct fluoroscopic guidance.  A peel-away sheath was introduced into the superior vena cava under fluoroscopic guidance.  The tunneling device was removed and the catheter fed through the peel-away sheath into the superior vena cava.  The peel-away sheath was removed and the catheter gently pulled back.  Adequate position was confirmed with x-ray.  The catheter was tested and found to flush and draw back well.  Catheter was heparin locked.  Caps were applied.  Catheter was sutured to the skin.  The neck incision was closed with 4-0 Monocryl.  Given the complexity of the case a first assistant was necessary in order to expedient the procedure and safely perform the technical aspects of the operation.  Macie Burows, MD [facility]  DATE OF DICTATION:   06/20/2021

## 2021-06-20 NOTE — Anesthesia Preprocedure Evaluation (Addendum)
Anesthesia Evaluation  Patient identified by MRN, date of birth, ID band Patient awake    Reviewed: Allergy & Precautions, Patient's Chart, lab work & pertinent test resultsPreop documentation limited or incomplete due to emergent nature of procedure.  Airway Mallampati: II  TM Distance: >3 FB Neck ROM: Full    Dental  (+) Poor Dentition   Pulmonary asthma , COPD, former smoker,    Pulmonary exam normal        Cardiovascular hypertension, + Peripheral Vascular Disease and +CHF  + dysrhythmias (on Eliquis) Atrial Fibrillation  Rhythm:Regular Rate:Normal     Neuro/Psych Anxiety CVA    GI/Hepatic negative GI ROS, (+) Cirrhosis   Esophageal Varices    ,   Endo/Other  diabetes, Type 2  Renal/GU CRF and DialysisRenal disease (bleeding fistula)  negative genitourinary   Musculoskeletal negative musculoskeletal ROS (+)   Abdominal Normal abdominal exam  (+)   Peds  Hematology  (+) Blood dyscrasia, anemia ,   Anesthesia Other Findings   Reproductive/Obstetrics                            Anesthesia Physical Anesthesia Plan  ASA: 4 and emergent  Anesthesia Plan: General   Post-op Pain Management:    Induction: Intravenous and Rapid sequence  PONV Risk Score and Plan: 2 and Ondansetron, Dexamethasone, Treatment may vary due to age or medical condition and Midazolam  Airway Management Planned: Mask and Oral ETT  Additional Equipment: None  Intra-op Plan:   Post-operative Plan: Extubation in OR  Informed Consent: I have reviewed the patients History and Physical, chart, labs and discussed the procedure including the risks, benefits and alternatives for the proposed anesthesia with the patient or authorized representative who has indicated his/her understanding and acceptance.     Dental advisory given  Plan Discussed with: CRNA  Anesthesia Plan Comments: (Lab Results      Component                 Value               Date                      WBC                      8.3                 06/20/2021                HGB                      13.1                06/20/2021                HCT                      41.0                06/20/2021                MCV                      95.6                06/20/2021  PLT                      75 (L)              06/20/2021           Lab Results      Component                Value               Date                      NA                       137                 06/20/2021                K                        5.3 (H)             06/20/2021                CO2                      23                  06/20/2021                GLUCOSE                  108 (H)             06/20/2021                BUN                      90 (H)              06/20/2021                CREATININE               11.50 (H)           06/20/2021                CALCIUM                  9.8                 06/20/2021                EGFR                     6 (L)               06/07/2021                GFRNONAA                 5 (L)               06/20/2021           ECHO 03/23: 1. Left ventricular ejection fraction, by estimation, is 35%. The left  ventricle demonstrates global hypokinesis. Left ventricular diastolic  parameters are indeterminate.  2. Right ventricular systolic function is moderately reduced.  The right  ventricular size is moderately enlarged. There is severely elevated  pulmonary artery systolic pressure. The estimated right ventricular  systolic pressure is 35.5 mmHg.  3. Left atrial size was moderately dilated.  4. Right atrial size was mildly dilated.  5. The mitral valve is degenerative. Moderate mitral valve regurgitation.  No evidence of mitral stenosis.  6. Tricuspid valve regurgitation is moderate.  7. The aortic valve is tricuspid. There is mild calcification of the  aortic valve. Aortic valve  sclerosis/calcification is present, without any  evidence of aortic stenosis.  8. The inferior vena cava is dilated in size with <50% respiratory  variability, suggesting right atrial pressure of 15 mmHg.  9. The patient was in atrial fibrillation. )       Anesthesia Quick Evaluation

## 2021-06-20 NOTE — H&P (Signed)
H+P   History of Present Illness: This is a 61 y.o. male history of left AV fistula revision complicated by PEA arrest.  Fistula has been good since that time.  He has had multiple coughing episodes over the past couple days and was noted to be bleeding this morning for which he presented to the emergency department and bleeding was controlled.  Patient does take Eliquis.  Past Medical History:  Diagnosis Date   Anemia    Anxiety    Asthma    CHF (congestive heart failure) ([place])    Complication from renal dialysis device 10/93/2355   Complication of anesthesia 2017   according to pt and spouse pt was moving around and cough while under and pt had difficulty waking up so the anesthesia had to be reversed. and pt admitted to ICU.    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    due to stroke   MR (mitral regurgitation)    moderate to severe, echo 07/2011   Peripheral vascular disease ([place])    Shortness of breath    with exertion   Stroke ([place])    TIA's-left sided weakness   Tobacco abuse     Past Surgical History:  Procedure Laterality Date   ABDOMINAL AORTOGRAM W/LOWER EXTREMITY Bilateral 07/21/2018   Procedure: ABDOMINAL AORTOGRAM W/LOWER EXTREMITY;  Surgeon: Waynetta Sandy, MD;  Location: [place] CV LAB;  Service: Cardiovascular;  Laterality: Bilateral;   AMPUTATION Left 11/05/2018   Procedure: AMPUTATION SECOND TOE LEFT FOOT;  Surgeon: Waynetta Sandy,  MD;  Location: [place];  Service: Vascular;  Laterality: Left;   AMPUTATION Left 02/13/2021   Procedure: Left small finger AMPUTATION DIGIT;  Surgeon: Sherilyn Cooter, MD;  Location: [place];  Service: Orthopedics;  Laterality: Left;   AV FISTULA PLACEMENT     CARDIAC CATHETERIZATION     [place] [facility]   COLONOSCOPY W/ BIOPSIES AND POLYPECTOMY     FISTULA SUPERFICIALIZATION Left 11/10/2013   Procedure: FISTULA PLICATION;  Surgeon: Serafina Mitchell, MD;  Location: [facility] OR;  Service: Vascular;  Laterality: Left;   KIDNEY TRANSPLANT     2011 rejected kidney 2012 back on dialysis   LEFT AND RIGHT HEART CATHETERIZATION WITH CORONARY ANGIOGRAM N/A 10/09/2013   Procedure: LEFT AND RIGHT HEART CATHETERIZATION WITH CORONARY ANGIOGRAM;  Surgeon: Burnell Blanks, MD;  Location: [facility] CATH LAB;  Service: Cardiovascular;  Laterality: N/A;   PERIPHERAL VASCULAR INTERVENTION Left 07/21/2018   Procedure: PERIPHERAL VASCULAR INTERVENTION;  Surgeon: Waynetta Sandy, MD;  Location: [place] CV LAB;  Service: Cardiovascular;  Laterality: Left;  Popliteal   REVISON OF ARTERIOVENOUS FISTULA Left 08/26/2013   Procedure: EXCISION OF ERODED SKIN AND EXPLORATION OF MAIN LEFT UPPER ARM AV FISTULA;  Surgeon: Rosetta Posner, MD;  Location: [place];  Service: Vascular;  Laterality: Left;   REVISON OF ARTERIOVENOUS FISTULA Left  02/05/2014   Procedure: REPAIR OF ARTERIOVENOUS FISTULA ANEURYSM;  Surgeon: Serafina Mitchell, MD;  Location: [place];  Service: Vascular;  Laterality: Left;   REVISON OF ARTERIOVENOUS FISTULA Left 03/10/2021   Procedure: LEFT ARM REVISION OF ARTERIOVENOUS FISTULA WITH PLICATION;  Surgeon: Waynetta Sandy, MD;  Location: [place];  Service: Vascular;  Laterality: Left;   SHUNTOGRAM N/A 11/05/2012   Procedure: Fistulogram;  Surgeon: Serafina Mitchell, MD;  Location: [facility] CATH LAB;  Service: Cardiovascular;  Laterality: N/A;   TEE WITHOUT CARDIOVERSION  08/17/2011   Procedure: TRANSESOPHAGEAL  ECHOCARDIOGRAM (TEE);  Surgeon: Larey Dresser, MD;  Location: [place];  Service: Cardiovascular;  Laterality: N/A;   VIDEO ASSISTED THORACOSCOPY (VATS)/DECORTICATION  08/10/11    Allergies  Allergen Reactions   Codeine Shortness Of Breath   Dextromethorphan-Guaifenesin Shortness Of Breath   Iron Dextran Shortness Of Breath   Morphine And Related Shortness Of Breath    Prior to Admission medications   Medication Sig Start Date End Date Taking? Authorizing Provider  acetaminophen (TYLENOL) 500 MG tablet Take 1 tablet (500 mg total) by mouth every 6 (six) hours as needed. Patient taking differently: Take 500 mg by mouth every 6 (six) hours as needed for mild pain or headache. 04/11/18  Yes Burky, Malachy Moan, NP  albuterol (PROAIR HFA) 108 (90 Base) MCG/ACT inhaler Inhale 2 puffs into the lungs every 6 (six) hours as needed for wheezing or shortness of breath. 06/07/21  Yes Gildardo Pounds, NP  apixaban (ELIQUIS) 2.5 MG TABS tablet Take 1 tablet (2.5 mg total) by mouth 2 (two) times daily. 04/25/21  Yes Burnell Blanks, MD  B Complex-C-Folic Acid (DIALYVITE 119) 0.8 MG TABS Take 1 tablet by mouth daily. 04/14/21  Yes [provider]  docusate sodium (COLACE) 100 MG capsule Take 1 capsule (100 mg total) by mouth 2 (two) times daily. Patient taking differently: Take 100 mg by mouth 2 (two) times daily as needed for mild constipation. 03/15/21  Yes Sheikh, Omair Latif, DO  fluticasone-salmeterol (ADVAIR DISKUS) 250-50 MCG/ACT AEPB Inhale 1 puff into the lungs in the morning and at bedtime. 06/07/21  Yes Gildardo Pounds, NP  hydrOXYzine (ATARAX) 10 MG tablet Take 1 tablet (10 mg total) by mouth 3 (three) times daily as needed. For anxiety Patient taking differently: Take 10 mg by mouth 3 (three) times daily as needed for anxiety. 06/07/21  Yes Gildardo Pounds, NP  midodrine (PROAMATINE) 10 MG tablet Place 1 tablet (10 mg total) into feeding tube 3 (three) times daily with  meals. Patient taking differently: Take 10 mg by mouth 3 (three) times daily with meals. 03/15/21  Yes Sheikh, Omair Latif, DO  montelukast (SINGULAIR) 10 MG tablet Take 1 tablet (10 mg total) by mouth at bedtime. 06/07/21  Yes Gildardo Pounds, NP  VELPHORO 500 MG chewable tablet Chew 1,000 mg by mouth 2 (two) times daily after a meal. 02/06/21  Yes [provider]  VISINE ADVANCED RELIEF 0.05-0.1-1-1 % SOLN Place 1 drop into both eyes 2 (two) times daily as needed (for dryness).   Yes [provider]  glucose blood test strip  12/26/09   [provider]    Social History   Socioeconomic History   Marital status: Single    Spouse name: Not on file   Number of children: 1   Years of education: Not on file   Highest education level: Not on file  Occupational History   Occupation: Careers adviser and [employer]  Employer: DISABLED  Tobacco Use   Smoking status: Former    Packs/day: 0.50    Years: 15.00    Total pack years: 7.50    Types: Cigarettes    Quit date: 2014    Years since quitting: 9.4   Smokeless tobacco: Never  Vaping Use   Vaping Use: Never used  Substance and Sexual Activity   Alcohol use: No    Alcohol/week: 0.0 standard drinks of alcohol   Drug use: No   Sexual activity: Not Currently  Other Topics Concern   Not on file  Social History Narrative   Not on file   Social Determinants of Health   Financial Resource Strain: Not on file  Food Insecurity: Not on file  Transportation Needs: Not on file  Physical Activity: Not on file  Stress: Not on file  Social Connections: Not on file  Intimate Partner Violence: Not on file     Family History  Problem Relation Age of Onset   Hypertension Mother    Varicose Veins Mother    Diabetes Paternal Grandmother    Cancer Paternal Grandfather    CAD Paternal Uncle     ROS: Bleeding left arm  Physical Examination  Vitals:   06/20/21 0710 06/20/21 0711  BP: (!) 82/66 (!) 86/55   Pulse:  97  Resp: (!) 21 20  Temp:    SpO2:  100%   There is no height or weight on file to calculate BMI.  Awake alert oriented Left upper arm with large Coban dressing in place that is saturated  CBC    Component Value Date/Time   WBC 8.3 06/20/2021 0444   RBC 4.29 06/20/2021 0444   HGB 13.1 06/20/2021 0444   HGB 14.3 06/07/2021 1621   HGB 11.5 (L) 01/17/2015 1454   HCT 41.0 06/20/2021 0444   HCT 43.6 06/07/2021 1621   HCT 35.7 (L) 01/17/2015 1454   PLT 75 (L) 06/20/2021 0444   PLT 67 (LL) 06/07/2021 1621   MCV 95.6 06/20/2021 0444   MCV 92 06/07/2021 1621   MCV 96.5 01/17/2015 1454   MCH 30.5 06/20/2021 0444   MCHC 32.0 06/20/2021 0444   RDW 17.9 (H) 06/20/2021 0444   RDW 15.2 06/07/2021 1621   RDW 15.5 (H) 01/17/2015 1454   LYMPHSABS 0.8 06/07/2021 1621   LYMPHSABS 0.7 (L) 01/17/2015 1454   MONOABS 0.5 03/15/2021 0848   MONOABS 0.3 01/17/2015 1454   EOSABS 0.4 06/07/2021 1621   BASOSABS 0.1 06/07/2021 1621   BASOSABS 0.0 01/17/2015 1454    BMET    Component Value Date/Time   NA 137 06/20/2021 0444   NA 141 06/07/2021 1621   NA 142 01/17/2015 1454   K 5.3 (H) 06/20/2021 0444   K 4.2 01/17/2015 1454   CL 94 (L) 06/20/2021 0444   CO2 23 06/20/2021 0444   CO2 28 01/17/2015 1454   GLUCOSE 108 (H) 06/20/2021 0444   GLUCOSE 96 01/17/2015 1454   BUN 90 (H) 06/20/2021 0444   BUN 44 (H) 06/07/2021 1621   BUN 46.9 (H) 01/17/2015 1454   CREATININE 11.50 (H) 06/20/2021 0444   CREATININE 9.9 (HH) 01/17/2015 1454   CALCIUM 9.8 06/20/2021 0444   CALCIUM 10.2 01/17/2015 1454   GFRNONAA 5 (L) 06/20/2021 0444   GFRAA 5 (L) 03/03/2018 2303    COAGS: Lab Results  Component Value Date   INR 1.4 (H) 06/20/2021   INR 1.0 10/08/2013   INR 1.02 08/08/2011     ASSESSMENT/PLAN: This  is a 61 y.o. male history of end-stage renal disease on dialysis via left arm AV fistula which was recently revised.  This started bleeding this morning with a coughing episode has been  controlled in the ER.  I did not take the dressing down but discussed with the patient that he will require left arm fistula ligation with tourniquet and tunneled dialysis catheter placement for dialysis access.  More than likely will require admission post procedure.    Aliceson Dolbow C. Donzetta Matters, MD [facility] Office: [phone] Pager: [phone]

## 2021-06-20 NOTE — Anesthesia Procedure Notes (Signed)
Arterial Line Insertion Start/End6/13/2023 7:53 AM, 06/20/2021 7:55 AM Performed by: Josephine Igo, CRNA, CRNA  Patient location: OR. Preanesthetic checklist: patient identified, IV checked, site marked, risks and benefits discussed, surgical consent, monitors and equipment checked, pre-op evaluation, timeout performed and anesthesia consent Patient sedated Right, radial was placed Catheter size: 20 G Hand hygiene performed  and maximum sterile barriers used  Allen's test indicative of satisfactory collateral circulation Attempts: 1 Procedure performed without using ultrasound guided technique. Following insertion, line sutured and Biopatch. Post procedure assessment: normal  Patient tolerated the procedure well with no immediate complications.

## 2021-06-20 NOTE — Progress Notes (Signed)
PHARMACY NOTE:  ANTIMICROBIAL RENAL DOSAGE ADJUSTMENT  Current antimicrobial regimen includes a mismatch between antimicrobial dosage and estimated renal function.  As per policy approved by the Pharmacy & Therapeutics and Medical Executive Committees, the antimicrobial dosage will be adjusted accordingly.  Current antimicrobial dosage:  cefazolin 2g IV q8h x2  Indication: surgical ppx  Renal Function:  Estimated Creatinine Clearance: 6.3 mL/min (A) (by C-G formula based on SCr of 12.7 mg/dL (H)). '[x]'$      On intermittent HD, scheduled: '[]'$      On CRRT    Antimicrobial dosage has been changed to:  cefazolin has been discontinued. Cefazolin 2g IV x1 given preop ~0830 which will provide at least 24h of surgical prophylaxis coverage.    Thank you for allowing pharmacy to be a part of this patient's care.  Arrie Senate, PharmD, BCPS, [facility] Clinical Pharmacist [phone] Please check AMION for all [place] numbers 06/20/2021

## 2021-06-20 NOTE — Anesthesia Procedure Notes (Signed)
Procedure Name: Intubation Date/Time: 06/20/2021 7:50 AM  Performed by: Betha Loa, CRNAPre-anesthesia Checklist: Patient identified, Emergency Drugs available, Suction available and Patient being monitored Patient Re-evaluated:Patient Re-evaluated prior to induction Oxygen Delivery Method: Circle System Utilized Preoxygenation: Pre-oxygenation with 100% oxygen Induction Type: IV induction Ventilation: Mask ventilation without difficulty Laryngoscope Size: Mac and 4 Grade View: Grade I Tube type: Oral Tube size: 7.5 mm Number of attempts: 1 Airway Equipment and Method: Stylet and Oral airway Placement Confirmation: ETT inserted through vocal cords under direct vision, positive ETCO2 and breath sounds checked- equal and bilateral Secured at: 22 cm Tube secured with: Tape Dental Injury: Teeth and Oropharynx as per pre-operative assessment

## 2021-06-20 NOTE — Consult Note (Signed)
Renal Service Consult Note [facility]  Nathaniel White 06/20/2021 [name], MD Requesting Physician: Dr. Donzetta Matters  Reason for Consult: ESRD sp ligation of bleeding AVF HPI: The patient is a 61 y.o. year-old w/ hx of anemia, CHF, COPD, ESRD on HD, sp CVA x2 w/ L hemiparesis, PAD who presented early this am due to bleeding from his LUA AV fistula. Pt is on eliquis and has been having coughing spells for days to weeks, lots of "allergies" too. In ED bleeding was controlled and pt taken to OR for AVF ligation and LIJ TDC placement. We are asked to see for ESRD.   Pt seen in ICU room. No SOB or CP, last HD was Sat. Hx CVA x 2, chronic L leg edema. Pt drives himself to HD or gets family help to drive.   ROS - denies CP, no joint pain, no HA, no blurry vision, no rash, no diarrhea, no nausea/ vomiting, no dysuria, no difficulty voiding   Past Medical History  Past Medical History:  Diagnosis Date   Anemia    Anxiety    Asthma    CHF (congestive heart failure) ([place])    Complication from renal dialysis device 62/83/1517   Complication of anesthesia 2017   according to pt and spouse pt was moving around and cough while under and pt had difficulty waking up so the anesthesia had to be reversed. and pt admitted to ICU.    COPD (chronic obstructive pulmonary disease) (HCC)    Diabetes mellitus without complication ([place])    Type II - Patient states he does not have diabetes, "they said sometimes when you start dialysis you don't have diabetes any longer"    ESOPHAGEAL VARICES 10/04/2008   Qualifier: Diagnosis of  By: Fuller Plan MD Lamont Snowball T    ESRD    on HD, T-TH-Sat - [place]   Hemiparesis due to old stroke [facility])    left   Hypertension    Hx, not current problems, no meds   LV dysfunction    EF 25-30% by echo 07/2011   Memory loss due to medical condition    due to stroke   MR (mitral regurgitation)    moderate to severe, echo 07/2011   Peripheral vascular disease  ([place])    Shortness of breath    with exertion   Stroke ([place])    TIA's-left sided weakness   Tobacco abuse    Past Surgical History  Past Surgical History:  Procedure Laterality Date   ABDOMINAL AORTOGRAM W/LOWER EXTREMITY Bilateral 07/21/2018   Procedure: ABDOMINAL AORTOGRAM W/LOWER EXTREMITY;  Surgeon: Waynetta Sandy, MD;  Location: [place] CV LAB;  Service: Cardiovascular;  Laterality: Bilateral;   AMPUTATION Left 11/05/2018   Procedure: AMPUTATION SECOND TOE LEFT FOOT;  Surgeon: Waynetta Sandy, MD;  Location: [place];  Service: Vascular;  Laterality: Left;   AMPUTATION Left 02/13/2021   Procedure: Left small finger AMPUTATION DIGIT;  Surgeon: Sherilyn Cooter, MD;  Location: [place];  Service: Orthopedics;  Laterality: Left;   AV FISTULA PLACEMENT     CARDIAC CATHETERIZATION     [place] [facility]   COLONOSCOPY W/ BIOPSIES AND POLYPECTOMY     FISTULA SUPERFICIALIZATION Left 11/10/2013   Procedure: FISTULA PLICATION;  Surgeon: Serafina Mitchell, MD;  Location: [facility] OR;  Service: Vascular;  Laterality: Left;   KIDNEY TRANSPLANT     2011 rejected kidney 2012 back on dialysis   LEFT AND RIGHT HEART CATHETERIZATION WITH CORONARY ANGIOGRAM N/A  10/09/2013   Procedure: LEFT AND RIGHT HEART CATHETERIZATION WITH CORONARY ANGIOGRAM;  Surgeon: Burnell Blanks, MD;  Location: [facility] CATH LAB;  Service: Cardiovascular;  Laterality: N/A;   PERIPHERAL VASCULAR INTERVENTION Left 07/21/2018   Procedure: PERIPHERAL VASCULAR INTERVENTION;  Surgeon: Waynetta Sandy, MD;  Location: [place] CV LAB;  Service: Cardiovascular;  Laterality: Left;  Popliteal   REVISON OF ARTERIOVENOUS FISTULA Left 08/26/2013   Procedure: EXCISION OF ERODED SKIN AND EXPLORATION OF MAIN LEFT UPPER ARM AV FISTULA;  Surgeon: Rosetta Posner, MD;  Location: [place];  Service: Vascular;  Laterality: Left;   REVISON OF ARTERIOVENOUS FISTULA Left 02/05/2014   Procedure: REPAIR OF ARTERIOVENOUS FISTULA ANEURYSM;   Surgeon: Serafina Mitchell, MD;  Location: [facility] OR;  Service: Vascular;  Laterality: Left;   REVISON OF ARTERIOVENOUS FISTULA Left 03/10/2021   Procedure: LEFT ARM REVISION OF ARTERIOVENOUS FISTULA WITH PLICATION;  Surgeon: Waynetta Sandy, MD;  Location: [place];  Service: Vascular;  Laterality: Left;   SHUNTOGRAM N/A 11/05/2012   Procedure: Fistulogram;  Surgeon: Serafina Mitchell, MD;  Location: [facility] CATH LAB;  Service: Cardiovascular;  Laterality: N/A;   TEE WITHOUT CARDIOVERSION  08/17/2011   Procedure: TRANSESOPHAGEAL ECHOCARDIOGRAM (TEE);  Surgeon: Larey Dresser, MD;  Location: [facility] ENDOSCOPY;  Service: Cardiovascular;  Laterality: N/A;   VIDEO ASSISTED THORACOSCOPY (VATS)/DECORTICATION  08/10/11   Family History  Family History  Problem Relation Age of Onset   Hypertension Mother    Varicose Veins Mother    Diabetes Paternal Grandmother    Cancer Paternal Grandfather    CAD Paternal Uncle    Social History  reports that he quit smoking about 9 years ago. His smoking use included cigarettes. He has a 7.50 pack-year smoking history. He has never used smokeless tobacco. He reports that he does not drink alcohol and does not use drugs. Allergies  Allergies  Allergen Reactions   Codeine Shortness Of Breath   Dextromethorphan-Guaifenesin Shortness Of Breath   Iron Dextran Shortness Of Breath   Morphine And Related Shortness Of Breath   Home medications Prior to Admission medications   Medication Sig Start Date End Date Taking? Authorizing Provider  acetaminophen (TYLENOL) 500 MG tablet Take 1 tablet (500 mg total) by mouth every 6 (six) hours as needed. Patient taking differently: Take 500 mg by mouth every 6 (six) hours as needed for mild pain or headache. 04/11/18  Yes Burky, Malachy Moan, NP  albuterol (PROAIR HFA) 108 (90 Base) MCG/ACT inhaler Inhale 2 puffs into the lungs every 6 (six) hours as needed for wheezing or shortness of breath. 06/07/21  Yes Gildardo Pounds, NP  apixaban  (ELIQUIS) 2.5 MG TABS tablet Take 1 tablet (2.5 mg total) by mouth 2 (two) times daily. 04/25/21  Yes Burnell Blanks, MD  B Complex-C-Folic Acid (DIALYVITE 034) 0.8 MG TABS Take 1 tablet by mouth daily. 04/14/21  Yes [provider]  docusate sodium (COLACE) 100 MG capsule Take 1 capsule (100 mg total) by mouth 2 (two) times daily. Patient taking differently: Take 100 mg by mouth 2 (two) times daily as needed for mild constipation. 03/15/21  Yes Sheikh, Omair Latif, DO  fluticasone-salmeterol (ADVAIR DISKUS) 250-50 MCG/ACT AEPB Inhale 1 puff into the lungs in the morning and at bedtime. 06/07/21  Yes Gildardo Pounds, NP  hydrOXYzine (ATARAX) 10 MG tablet Take 1 tablet (10 mg total) by mouth 3 (three) times daily as needed. For anxiety Patient taking differently: Take 10 mg by mouth 3 (  three) times daily as needed for anxiety. 06/07/21  Yes Gildardo Pounds, NP  midodrine (PROAMATINE) 10 MG tablet Place 1 tablet (10 mg total) into feeding tube 3 (three) times daily with meals. Patient taking differently: Take 10 mg by mouth 3 (three) times daily with meals. 03/15/21  Yes Sheikh, Omair Latif, DO  montelukast (SINGULAIR) 10 MG tablet Take 1 tablet (10 mg total) by mouth at bedtime. 06/07/21  Yes Gildardo Pounds, NP  VELPHORO 500 MG chewable tablet Chew 1,000 mg by mouth 2 (two) times daily after a meal. 02/06/21  Yes [provider]  VISINE ADVANCED RELIEF 0.05-0.1-1-1 % SOLN Place 1 drop into both eyes 2 (two) times daily as needed (for dryness).   Yes [provider]  glucose blood test strip  12/26/09   [provider]     Vitals:   06/20/21 9485 06/20/21 0959 06/20/21 1000 06/20/21 1100  BP: (!) 86/66   108/67  Pulse:   99 94  Resp: '17  16 14  '$ Temp:      TempSrc:      SpO2:  98% 97% 100%  Weight:      Height:       Exam Gen alert, no distress No rash, cyanosis or gangrene Sclera anicteric, throat clear  No jvd or bruits Chest clear bilat to bases,  no rales/ wheezing RRR no MRG Abd soft ntnd no mass or ascites +bs GU normal male MS no joint effusions or deformity Ext no edema, no wounds or ulcers Neuro is alert, Ox 3 , nf    LUA AVF no bruit, arm wrapped    IJ TDC intact, new     Home meds include - acetminophen, albuterol, apixaban, dialyvite, docusate, fluticasone-salmeterol, hydroxyzine, midodrine 10 tid, montelukast, velphoro 1 gm ac      OP HD: SW TTS 4h  450/500  75.5kg   2/2 bath P2  TDC (AVF ligated, new TDC)   Hep none - last HD 6/10, 80.7 > 77.7kg - doxercalciferol 5 ug tiw IV - etecalcetide 7.5 mg tiw IV - no esa, last Hb   Assessment/ Plan: Bleeding AVF - ligated by [facility] today, 6/13.  Hb 11 > 9.0 here.  HD access - new L TDC placed, will use for next HD ESRD - on HD TTS.  Plan HD today on schedule.  BP/ volume - not on any bp lowering meds at home. Takes midodrine 10 tid to keep BP up it appears.  No vol excess, at dry wt. UF 1-2 L as tol.  Anemia esrd + abl - transfuse prn. Not on esa at op unit.  MBD ckd - CCa in range, will add phos. Cont velphoro as binder.       Rob Lynnmarie Lovett  MD 06/20/2021, 2:20 PM Recent Labs  Lab 06/20/21 0444 06/20/21 0722 06/20/21 0728 06/20/21 1024  HGB 13.1   < > 11.6* 9.0*  ALBUMIN 3.4*  --   --  2.7*  CALCIUM 9.8  --   --  8.8*  CREATININE 11.50*  --  12.70* 11.29*  K 5.3*  --  4.4 4.8   < > = values in this interval not displayed.

## 2021-06-20 NOTE — ED Provider Notes (Signed)
[facility] EMERGENCY DEPARTMENT Provider Note   CSN: [id number] Arrival date & time: 06/20/21  0355     History  Chief complaint - cough and shortness of breath  Level 5 caveat due to acuity of condition Nathaniel White is a 61 y.o. male.  The history is provided by the patient and the EMS personnel. The history is limited by the condition of the patient.  Patient arrives via EMS.  Patient reports over the past several days has had increasing cough and shortness of breath.  He reports he is unable to control his coughing.  He reports earlier tonight he was coughing so hard he started bleeding from his dialysis access.  He reports he is on blood thinners.  No other details are known on arrival     Home Medications Prior to Admission medications   Medication Sig Start Date End Date Taking? Authorizing Provider  acetaminophen (TYLENOL) 500 MG tablet Take 1 tablet (500 mg total) by mouth every 6 (six) hours as needed. Patient taking differently: Take 500 mg by mouth every 6 (six) hours as needed for mild pain or headache. 04/11/18  Yes Burky, Malachy Moan, NP  albuterol (PROAIR HFA) 108 (90 Base) MCG/ACT inhaler Inhale 2 puffs into the lungs every 6 (six) hours as needed for wheezing or shortness of breath. 06/07/21  Yes Gildardo Pounds, NP  apixaban (ELIQUIS) 2.5 MG TABS tablet Take 1 tablet (2.5 mg total) by mouth 2 (two) times daily. 04/25/21  Yes Burnell Blanks, MD  B Complex-C-Folic Acid (DIALYVITE 027) 0.8 MG TABS Take 1 tablet by mouth daily. 04/14/21  Yes [provider]  docusate sodium (COLACE) 100 MG capsule Take 1 capsule (100 mg total) by mouth 2 (two) times daily. Patient taking differently: Take 100 mg by mouth 2 (two) times daily as needed for mild constipation. 03/15/21  Yes Sheikh, Omair Latif, DO  fluticasone-salmeterol (ADVAIR DISKUS) 250-50 MCG/ACT AEPB Inhale 1 puff into the lungs in the morning and at bedtime. 06/07/21  Yes Gildardo Pounds,  NP  hydrOXYzine (ATARAX) 10 MG tablet Take 1 tablet (10 mg total) by mouth 3 (three) times daily as needed. For anxiety Patient taking differently: Take 10 mg by mouth 3 (three) times daily as needed for anxiety. 06/07/21  Yes Gildardo Pounds, NP  midodrine (PROAMATINE) 10 MG tablet Place 1 tablet (10 mg total) into feeding tube 3 (three) times daily with meals. Patient taking differently: Take 10 mg by mouth 3 (three) times daily with meals. 03/15/21  Yes Sheikh, Omair Latif, DO  montelukast (SINGULAIR) 10 MG tablet Take 1 tablet (10 mg total) by mouth at bedtime. 06/07/21  Yes Gildardo Pounds, NP  VELPHORO 500 MG chewable tablet Chew 1,000 mg by mouth 2 (two) times daily after a meal. 02/06/21  Yes [provider]  VISINE ADVANCED RELIEF 0.05-0.1-1-1 % SOLN Place 1 drop into both eyes 2 (two) times daily as needed (for dryness).   Yes [provider]  glucose blood test strip  12/26/09   [provider]      Allergies    Codeine, Dextromethorphan-guaifenesin, Iron dextran, and Morphine and related    Review of Systems   Review of Systems  Unable to perform ROS: Acuity of condition    Physical Exam Updated Vital Signs BP (!) 86/55   Pulse 97   Temp 99.3 F (37.4 C) (Oral)   Resp 20   Ht 1.753 m ('5\' 9"'$ )   Wt  75.8 kg   SpO2 100%   BMI 24.66 kg/m  Physical Exam CONSTITUTIONAL: Ill-appearing HEAD: Normocephalic/atraumatic EYES: EOMI ENMT: Mucous membranes moist NECK: supple no meningeal signs SPINE/BACK:entire spine nontender CV: S1/S2 noted, tachycardic LUNGS: Tachypnea, coughing almost continuously throughout exam, wheezing bilateral ABDOMEN: soft, nontender NEURO: Pt is awake/alert/appropriate, moves all extremitiesx4.  No facial droop.   EXTREMITIES: Left arm is bandaged with blood soaking around the bandage SKIN: warm PSYCH: Anxious ED Results / Procedures / Treatments   Labs (all labs ordered are listed, but only abnormal results are  displayed) Labs Reviewed  COMPREHENSIVE METABOLIC PANEL - Abnormal; Notable for the following components:      Result Value   Potassium 5.3 (*)    Chloride 94 (*)    Glucose, Bld 108 (*)    BUN 90 (*)    Creatinine, Ser 11.50 (*)    Albumin 3.4 (*)    Total Bilirubin 1.5 (*)    GFR, Estimated 5 (*)    Anion gap 20 (*)    All other components within normal limits  CBC - Abnormal; Notable for the following components:   RDW 17.9 (*)    Platelets 75 (*)    All other components within normal limits  PROTIME-INR - Abnormal; Notable for the following components:   Prothrombin Time 16.5 (*)    INR 1.4 (*)    All other components within normal limits  BRAIN NATRIURETIC PEPTIDE - Abnormal; Notable for the following components:   B Natriuretic Peptide 2,645.2 (*)    All other components within normal limits  RESP PANEL BY RT-PCR (FLU A&B, COVID) ARPGX2  CBC  I-STAT CHEM 8, ED  TYPE AND SCREEN  PREPARE RBC (CROSSMATCH)    EKG EKG Interpretation  Date/Time:  Tuesday June 20 2021 04:55:55 EDT Ventricular Rate:  104 PR Interval:    QRS Duration: 111 QT Interval:  360 QTC Calculation: 474 R Axis:   140 Text Interpretation: Atrial fibrillation Left posterior fascicular block Consider left ventricular hypertrophy Nonspecific T abnormalities, diffuse leads Confirmed by Ripley Fraise [phone]) on 06/20/2021 5:05:02 AM  Radiology DG Chest Port 1 View  Result Date: 06/20/2021 CLINICAL DATA:  61 year old male with history of shortness of breath and productive cough. EXAM: PORTABLE CHEST 1 VIEW COMPARISON:  Chest x-ray 06/07/2021. FINDINGS: Lung volumes are slightly low. Widespread patchy areas of interstitial prominence, peribronchial cuffing, patchy ill-defined opacities and areas of architectural distortion are again noted throughout the lungs bilaterally, most evident in the mid to lower lungs (right-greater-than-left) with areas of chronic pleuroparenchymal scarring and architectural  distortion in the right mid to lower lung, similar to prior examinations. No definite acute consolidative airspace disease. No definite pleural effusions. No pneumothorax. Pulmonary vasculature is largely obscured. Heart size is mildly enlarged. Mediastinal contours are distorted by patient's positioning. Atherosclerotic calcifications in the thoracic aorta. IMPRESSION: 1. Grossly abnormal chest x-ray, which is largely similar to prior examinations. Overall, imaging findings are favored to reflect interstitial lung disease and areas of chronic post infectious or inflammatory scarring (particularly in the right lung base), given the similarity to prior studies. The possibility of acute disease (edema or infection) is not excluded, but not strongly favored. These findings could be much better evaluated with follow-up high-resolution chest CT in the near future. 2. Cardiomegaly. 3. Aortic atherosclerosis. Electronically Signed   By: Vinnie Langton M.D.   On: 06/20/2021 05:30    Procedures .Critical Care  Performed by: Ripley Fraise, MD Authorized by: Ripley Fraise, MD  Critical care provider statement:    Critical care time (minutes):  90   Critical care start time:  06/20/2021 6:00 AM   Critical care end time:  06/20/2021 7:30 AM   Critical care time was exclusive of:  Separately billable procedures and treating other patients   Critical care was necessary to treat or prevent imminent or life-threatening deterioration of the following conditions:  Respiratory failure, shock and renal failure   Critical care was time spent personally by me on the following activities:  Development of treatment plan with patient or surrogate, re-evaluation of patient's condition, pulse oximetry, ordering and review of radiographic studies, ordering and review of laboratory studies, ordering and performing treatments and interventions, review of old charts, evaluation of patient's response to treatment, examination of  patient, obtaining history from patient or surrogate and discussions with consultants   I assumed direction of critical care for this patient from another provider in my specialty: no     Care discussed with: admitting provider       Medications Ordered in ED Medications  sodium chloride 0.9 % bolus 1,000 mL (1,000 mLs Intravenous New Bag/Given 06/20/21 0726)  prothrombin complex conc human (KCENTRA) IVPB 3,540 Units (has no administration in time range)  0.9 %  sodium chloride infusion (has no administration in time range)  albuterol (PROVENTIL) (2.5 MG/3ML) 0.083% nebulizer solution (10 mg/hr Nebulization Given 06/20/21 0446)  ondansetron (ZOFRAN) 4 MG/2ML injection (4 mg  Given 06/20/21 2878)    ED Course/ Medical Decision Making/ A&P Clinical Course as of 06/20/21 0742  Tue Jun 20, 2021  0454 I saw patient soon after arrival.  He was coughing throughout the exam which exacerbated the bleeding from his left arm.  I took down the bandage partially and it began to bleed again.  Patient's arm was wrapped with a pressure bandage with good control bleeding.  We will consult vascular surgery [DW]  0543 Potassium(!): 5.3 Mild hyperkalemia [DW]  0543 Creatinine(!): 11.50 Chronic renal failure [DW]  0544 Patient is improving, bleeding is controlled, coughing is improved [DW]  6767 Discussed with Dr. Donzetta Matters, he will come to see the patient. [DW]  2094 Patient seen by vascular and he will be taken emergently to the operating room.  Patient is now becoming hypotensive with some leaking of blood around the bandage.  His initial hemoglobin was appropriate.  We will recheck his Chem-8.  He has been put in Trendelenburg and will be given IV fluids.  Change in status was endorsed to the vascular surgeon.  He will be going the OR soon [DW]  0732 Patient still with hypotension, due to urgency of situation will order Kcentra reversal as he is on Eliquis, and packed red cells [DW]    Clinical Course User  Index [DW] Ripley Fraise, MD                           Medical Decision Making Amount and/or Complexity of Data Reviewed Labs: ordered. Decision-making details documented in ED Course. Radiology: ordered.  Risk Prescription drug management. Decision regarding hospitalization.   This patient presents to the ED for concern of cough and shortness of breath, this involves an extensive number of treatment options, and is a complaint that carries with it a high risk of complications and morbidity.  The differential diagnosis includes but is not limited to CHF exacerbation, pneumonia, pneumothorax, acute coronary syndrome  Comorbidities that complicate the patient evaluation: Patient's presentation is complicated  by their history of end-stage renal disease, atrial fibrillation, thrombocytopenia  Social Determinants of Health: Patient's  history of memory loss   increases the complexity of managing their presentation  Additional history obtained: Records reviewed Primary Care Documents  Lab Tests: I Ordered, and personally interpreted labs.  The pertinent results include: Mild hyperkalemia, renal failure  Imaging Studies ordered: I ordered imaging studies including X-ray chest   I independently visualized and interpreted imaging which showed   ?Interstitial lung disease, possible pulmonary edema I agree with the radiologist interpretation  Cardiac Monitoring: The patient was maintained on a cardiac monitor.  I personally viewed and interpreted the cardiac monitor which showed an underlying rhythm of:  Atrial Fibrillation  Medicines ordered and prescription drug management: IV fluids ordered for hypertension, Zofran for nausea Blood products were ordered as he was being transferred to the operating room  Critical Interventions:  IV fluids, blood products  Consultations Obtained: I requested consultation with the consultant Dr. Donzetta Matters with vascular surgeon , and discussed   findings as well as pertinent plan - they recommend: will take to OR for management  Reevaluation: After the interventions noted above, I reevaluated the patient and found that they have :stayed the same  Complexity of problems addressed: Patient's presentation is most consistent with  acute presentation with potential threat to life or bodily function  Disposition: After consideration of the diagnostic results and the patient's response to treatment,  I feel that the patent would benefit from admission   .           Final Clinical Impression(s) / ED Diagnoses Final diagnoses:  Shortness of breath  ESRD (end stage renal disease) ([place])  Complication from renal dialysis device, initial encounter    Rx / DC Orders ED Discharge Orders     None         Ripley Fraise, MD 06/20/21 [phone]

## 2021-06-20 NOTE — ED Triage Notes (Signed)
Pt arrived via [facility]  Fistula left arm started bleeding  80-100 ml of blood loss saturated whole pants  Pt on eliquis  on a T, Thurs, Saturday schedule for dialysis  fire dept. wrapped up fistula in the field, bleeding is stable  excited exacerbated COPD  got '5mg'$  of albuterol  Pt is still coughing roughly  EKG showed pt in A-Fib   tried a few times to get IV access in the field, unsuccessful   last CBG 118  BP 127/84  HR 95 SpO2: 98  RR: 18

## 2021-06-21 ENCOUNTER — Encounter (HOSPITAL_COMMUNITY): Payer: Self-pay | Admitting: Vascular Surgery

## 2021-06-21 ENCOUNTER — Inpatient Hospital Stay (HOSPITAL_COMMUNITY): Payer: Medicare Other

## 2021-06-21 DIAGNOSIS — I480 Paroxysmal atrial fibrillation: Secondary | ICD-10-CM

## 2021-06-21 DIAGNOSIS — I5020 Unspecified systolic (congestive) heart failure: Secondary | ICD-10-CM

## 2021-06-21 DIAGNOSIS — I639 Cerebral infarction, unspecified: Secondary | ICD-10-CM

## 2021-06-21 DIAGNOSIS — J189 Pneumonia, unspecified organism: Secondary | ICD-10-CM

## 2021-06-21 DIAGNOSIS — N186 End stage renal disease: Secondary | ICD-10-CM | POA: Diagnosis not present

## 2021-06-21 DIAGNOSIS — J850 Gangrene and necrosis of lung: Secondary | ICD-10-CM | POA: Diagnosis not present

## 2021-06-21 DIAGNOSIS — J9601 Acute respiratory failure with hypoxia: Secondary | ICD-10-CM | POA: Diagnosis not present

## 2021-06-21 DIAGNOSIS — R9389 Abnormal findings on diagnostic imaging of other specified body structures: Secondary | ICD-10-CM

## 2021-06-21 DIAGNOSIS — I700 Atherosclerosis of aorta: Secondary | ICD-10-CM

## 2021-06-21 DIAGNOSIS — J810 Acute pulmonary edema: Secondary | ICD-10-CM | POA: Diagnosis not present

## 2021-06-21 DIAGNOSIS — I9589 Other hypotension: Secondary | ICD-10-CM

## 2021-06-21 DIAGNOSIS — I2510 Atherosclerotic heart disease of native coronary artery without angina pectoris: Secondary | ICD-10-CM

## 2021-06-21 LAB — BLOOD GAS, VENOUS
Acid-base deficit: 2.8 mmol/L — ABNORMAL HIGH (ref 0.0–2.0)
Bicarbonate: 24.1 mmol/L (ref 20.0–28.0)
Drawn by: 1390
O2 Saturation: 43.6 %
Patient temperature: 36.9
pCO2, Ven: 49 mmHg (ref 44–60)
pH, Ven: 7.3 (ref 7.25–7.43)
pO2, Ven: 33 mmHg (ref 32–45)

## 2021-06-21 LAB — BPAM RBC
Blood Product Expiration Date: 202306152359
Blood Product Expiration Date: 202306262359
ISSUE DATE / TIME: 202306130734
ISSUE DATE / TIME: 202306140426
Unit Type and Rh: 5100
Unit Type and Rh: 600

## 2021-06-21 LAB — HEPATIC FUNCTION PANEL
ALT: 10 U/L (ref 0–44)
AST: 20 U/L (ref 15–41)
Albumin: 2.8 g/dL — ABNORMAL LOW (ref 3.5–5.0)
Alkaline Phosphatase: 38 U/L (ref 38–126)
Bilirubin, Direct: 0.2 mg/dL (ref 0.0–0.2)
Indirect Bilirubin: 0.7 mg/dL (ref 0.3–0.9)
Total Bilirubin: 0.9 mg/dL (ref 0.3–1.2)
Total Protein: 5.8 g/dL — ABNORMAL LOW (ref 6.5–8.1)

## 2021-06-21 LAB — GLUCOSE, CAPILLARY
Glucose-Capillary: 152 mg/dL — ABNORMAL HIGH (ref 70–99)
Glucose-Capillary: 123 mg/dL — ABNORMAL HIGH (ref 70–99)
Glucose-Capillary: 73 mg/dL (ref 70–99)
Glucose-Capillary: 111 mg/dL — ABNORMAL HIGH (ref 70–99)

## 2021-06-21 LAB — POCT I-STAT 7, (LYTES, BLD GAS, ICA,H+H)
Acid-base deficit: 4 mmol/L — ABNORMAL HIGH (ref 0.0–2.0)
Bicarbonate: 21.8 mmol/L (ref 20.0–28.0)
Calcium, Ion: 1.08 mmol/L — ABNORMAL LOW (ref 1.15–1.40)
HCT: 32 % — ABNORMAL LOW (ref 39.0–52.0)
Hemoglobin: 10.9 g/dL — ABNORMAL LOW (ref 13.0–17.0)
O2 Saturation: 99 %
Potassium: 4.3 mmol/L (ref 3.5–5.1)
Sodium: 138 mmol/L (ref 135–145)
TCO2: 23 mmol/L (ref 22–32)
pCO2 arterial: 40.8 mmHg (ref 32–48)
pH, Arterial: 7.335 — ABNORMAL LOW (ref 7.35–7.45)
pO2, Arterial: 152 mmHg — ABNORMAL HIGH (ref 83–108)

## 2021-06-21 LAB — BASIC METABOLIC PANEL
Anion gap: 12 (ref 5–15)
BUN: 45 mg/dL — ABNORMAL HIGH (ref 6–20)
CO2: 26 mmol/L (ref 22–32)
Calcium: 8.2 mg/dL — ABNORMAL LOW (ref 8.9–10.3)
Chloride: 98 mmol/L (ref 98–111)
Creatinine, Ser: 6.86 mg/dL — ABNORMAL HIGH (ref 0.61–1.24)
GFR, Estimated: 9 mL/min — ABNORMAL LOW (ref >60)
Glucose, Bld: 144 mg/dL — ABNORMAL HIGH (ref 70–99)
Potassium: 4.5 mmol/L (ref 3.5–5.1)
Sodium: 136 mmol/L (ref 135–145)

## 2021-06-21 LAB — CBC
HCT: 24.5 % — ABNORMAL LOW (ref 39.0–52.0)
Hemoglobin: 8.2 g/dL — ABNORMAL LOW (ref 13.0–17.0)
MCH: 31.2 pg (ref 26.0–34.0)
MCHC: 33.5 g/dL (ref 30.0–36.0)
MCV: 93.2 fL (ref 80.0–100.0)
Platelets: 51 10*3/uL — ABNORMAL LOW (ref 150–400)
RBC: 2.63 MIL/uL — ABNORMAL LOW (ref 4.22–5.81)
RDW: 17.7 % — ABNORMAL HIGH (ref 11.5–15.5)
WBC: 7.1 10*3/uL (ref 4.0–10.5)
nRBC: 0 % (ref 0.0–0.2)

## 2021-06-21 LAB — TYPE AND SCREEN
ABO/RH(D): A POS
Antibody Screen: NEGATIVE
Unit division: 0
Unit division: 0

## 2021-06-21 LAB — HEMOGLOBIN AND HEMATOCRIT, BLOOD
HCT: 26 % — ABNORMAL LOW (ref 39.0–52.0)
Hemoglobin: 8.4 g/dL — ABNORMAL LOW (ref 13.0–17.0)

## 2021-06-21 LAB — HEPATITIS B SURFACE ANTIBODY, QUANTITATIVE: Hep B S AB Quant (Post): 758.1 m[IU]/mL (ref >9.9)

## 2021-06-21 LAB — LACTIC ACID, PLASMA
Lactic Acid, Venous: 2.3 mmol/L (ref 0.5–1.9)
Lactic Acid, Venous: 2.3 mmol/L (ref 0.5–1.9)

## 2021-06-21 LAB — C-REACTIVE PROTEIN: CRP: 9 mg/dL — ABNORMAL HIGH (ref <1.0)

## 2021-06-21 LAB — SEDIMENTATION RATE: Sed Rate: 27 mm/hr — ABNORMAL HIGH (ref 0–16)

## 2021-06-21 LAB — PHOSPHORUS: Phosphorus: 6.9 mg/dL — ABNORMAL HIGH (ref 2.5–4.6)

## 2021-06-21 LAB — BLOOD PRODUCT ORDER (VERBAL) VERIFICATION

## 2021-06-21 LAB — LIPASE, BLOOD: Lipase: 43 U/L (ref 11–51)

## 2021-06-21 MED ORDER — METHYLPREDNISOLONE SODIUM SUCC 40 MG IJ SOLR
40.0000 mg | Freq: Every day | INTRAMUSCULAR | Status: DC
  Administered 2021-06-21 – 2021-06-26 (×5): 40 mg via INTRAVENOUS
  Filled 2021-06-21 (×6): qty 1

## 2021-06-21 MED ORDER — VANCOMYCIN HCL 750 MG/150ML IV SOLN
750.0000 mg | INTRAVENOUS | Status: DC
  Filled 2021-06-21: qty 150

## 2021-06-21 MED ORDER — DOXYCYCLINE HYCLATE 100 MG PO TABS
100.0000 mg | ORAL_TABLET | Freq: Two times a day (BID) | ORAL | Status: DC
  Administered 2021-06-21 – 2021-06-25 (×10): 100 mg via ORAL
  Filled 2021-06-21 (×10): qty 1

## 2021-06-21 MED ORDER — SODIUM CHLORIDE 0.9 % IV SOLN
2.0000 g | INTRAVENOUS | Status: AC
  Administered 2021-06-21: 2 g via INTRAVENOUS
  Filled 2021-06-21: qty 12.5

## 2021-06-21 MED ORDER — ARFORMOTEROL TARTRATE 15 MCG/2ML IN NEBU
15.0000 ug | INHALATION_SOLUTION | Freq: Two times a day (BID) | RESPIRATORY_TRACT | Status: DC
  Administered 2021-06-21 – 2021-06-26 (×9): 15 ug via RESPIRATORY_TRACT
  Filled 2021-06-21 (×9): qty 2

## 2021-06-21 MED ORDER — SODIUM CHLORIDE 0.9 % IV SOLN: 2.0000 g | INTRAVENOUS | Status: DC

## 2021-06-21 MED ORDER — REVEFENACIN 175 MCG/3ML IN SOLN
175.0000 ug | Freq: Every day | RESPIRATORY_TRACT | Status: DC
  Administered 2021-06-21 – 2021-06-26 (×6): 175 ug via RESPIRATORY_TRACT
  Filled 2021-06-21 (×7): qty 3

## 2021-06-21 MED ORDER — CHLORHEXIDINE GLUCONATE CLOTH 2 % EX PADS
6.0000 | MEDICATED_PAD | Freq: Every day | CUTANEOUS | Status: DC
  Administered 2021-06-22 – 2021-06-24 (×3): 6 via TOPICAL

## 2021-06-21 MED ORDER — PIPERACILLIN-TAZOBACTAM IN DEX 2-0.25 GM/50ML IV SOLN
2.2500 g | Freq: Three times a day (TID) | INTRAVENOUS | Status: DC
  Administered 2021-06-21 – 2021-06-24 (×7): 2.25 g via INTRAVENOUS
  Filled 2021-06-21 (×14): qty 50

## 2021-06-21 MED ORDER — VANCOMYCIN HCL 1750 MG/350ML IV SOLN
1750.0000 mg | INTRAVENOUS | Status: AC
  Administered 2021-06-21: 1750 mg via INTRAVENOUS
  Filled 2021-06-21 (×2): qty 350

## 2021-06-21 NOTE — Assessment & Plan Note (Addendum)
S/p L arm brachiocephalic fistula ligation and L IJ 23 cm tunneled dialysis catheter placement 6/13 Plan for permanent access outpatient [employer] in place, per vascular, follow in 3-4 weeks

## 2021-06-21 NOTE — Anesthesia Postprocedure Evaluation (Signed)
Anesthesia Post Note  Patient: Nathaniel White  Procedure(s) Performed: LIGATION OF ARTERIOVENOUS  FISTULA (Left: Arm Upper) INSERTION OF DIALYSIS CATHETER ULTRASOUND GUIDANCE FOR VASCULAR ACCESS     Patient location during evaluation: PACU Anesthesia Type: General Level of consciousness: awake and alert Pain management: pain level controlled Vital Signs Assessment: post-procedure vital signs reviewed and stable Respiratory status: spontaneous breathing, nonlabored ventilation, respiratory function stable and patient connected to nasal cannula oxygen Cardiovascular status: blood pressure returned to baseline and stable Postop Assessment: no apparent nausea or vomiting Anesthetic complications: no   No notable events documented.  Last Vitals:  Vitals:   06/21/21 1144 06/21/21 1200  BP:    Pulse: (!) 125   Resp: (!) 23   Temp:  37.1 C  SpO2: (!) 88%     Last Pain:  Vitals:   06/21/21 1200  TempSrc: Axillary  PainSc:                  March Rummage Triva Hueber

## 2021-06-21 NOTE — Assessment & Plan Note (Addendum)
Relatively stable around 8 over past few days downtrended with initial bleeding Follow outpatient

## 2021-06-21 NOTE — Hospital Course (Addendum)
61 yo with hx ESRD on HD, HFrEF, chronic restrictive lung disease, hx tobacco abuse, and multiple other medical issues who presented with increasing cough and shortness of breath, resulting in bleeding from his dialysis access.  He was admitted to vascular service for left arm brachiocephalic fistula ligation and L IJ 23 cm tunneled dialysis catheter placement.  TRH picked him up on 6/14 and he was noted to have persistent cough, shortness of breath, and new hypoxia.  CT showed findings concerning for necrotizing pneumonia.  Pulmonology consulted.  He's gradually improved with antibiotics and steroids.  Blood counts notable for anemia (relatively stable after initial downtrend) and thrombocytopenia.  Plan for discharge on abx and steroids with outpatient follow up with pulmonology, renal, PCP, vascular, and cardiology.  Eliquis on hold due to thrombocytopenia.   See below for additional details  See below for additional details

## 2021-06-21 NOTE — Assessment & Plan Note (Signed)
midodrine

## 2021-06-21 NOTE — Assessment & Plan Note (Addendum)
eliquis  Can resume cautiously at that point Hematology referral, needs to follow with cardiology as well

## 2021-06-21 NOTE — Progress Notes (Addendum)
Pharmacy Antibiotic Note  Nathaniel White is a 61 y.o. male admitted on 06/20/2021 with fistula hemorrhage. Pt now SOB and tachypnic, pharmacy has been consulted for vancomycin and cefepime dosing for sepsis. Pt has ESRD on HD TTS, last iHD 6/13, next tomorrow.  Plan: Cefepime 2g IV x1 then qHD TTS Vancomycin '1750mg'$  IV x1 then '750mg'$  qHD TTS  ADDENDUM: CCM concerned for aspiration PNA, transitioning cefepime to Zosyn.   Height: '5\' 9"'$  (175.3 cm) Weight: 75.3 kg (166 lb 0.1 oz) IBW/kg (Calculated) : 70.7  Temp (24hrs), Avg:98.2 F (36.8 C), Min:97.9 F (36.6 C), Max:98.7 F (37.1 C)  Recent Labs  Lab 06/20/21 0444 06/20/21 0722 06/20/21 0728 06/20/21 1024 06/20/21 2004 06/21/21 0415  WBC 8.3 10.4  --  9.0 7.3 7.1  CREATININE 11.50*  --  12.70* 11.29* 6.04* 6.86*    Estimated Creatinine Clearance: 11.7 mL/min (A) (by C-G formula based on SCr of 6.86 mg/dL (H)).    Allergies  Allergen Reactions   Codeine Shortness Of Breath   Dextromethorphan-Guaifenesin Shortness Of Breath   Iron Dextran Shortness Of Breath   Morphine And Related Shortness Of Breath    Antimicrobials this admission: Vancomycin 6/14 >>  Cefepime 6/14 >>   Dose adjustments this admission: N/a  Microbiology results: 6/13 MRSA PCR: negative  Thank you for allowing pharmacy to be a part of this patient's care.  Arrie Senate, PharmD, BCPS, [facility] Clinical Pharmacist [phone] Please check AMION for all Blanket numbers 06/21/2021

## 2021-06-21 NOTE — Progress Notes (Signed)
PROGRESS NOTE    Nathaniel White  YNW:[id number] DOB: 29-Jan-1960 DOA: 06/20/2021 PCP: Gildardo Pounds, NP  Chief Complaint  Patient presents with   Shortness of Breath   Vascular Access Problem    Brief Narrative:  61 yo with hx ESRD on HD, HFrEF, chronic restrictive lung disease, hx tobacco abuse, and multiple other medical issues who presented with increasing cough and shortness of breath, resulting in bleeding from his dialysis access.  He was admitted to vascular service for left arm brachiocephalic fistula ligation and L IJ 23 cm tunneled dialysis catheter placement.  TRH picked him up on 6/14 and he was noted to have persistent cough, shortness of breath, and new hypoxia.  CT showed findings concerning for necrotizing pneumonia.  Pulmonology consulted.  See below for additional details    Assessment & Plan:   Principal Problem:   Necrotizing pneumonia ([place]) Active Problems:   Acute respiratory failure with hypoxia ([place])   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 (HCC)   ESRD (end stage renal disease) (HCC)   Restrictive lung disease   HFrEF (heart failure with reduced ejection fraction) (HCC)   Paroxysmal atrial fibrillation (HCC)   Type 2 diabetes mellitus (HCC)   CVA (cerebral vascular accident) ([place])   Chronic hypotension   Abnormal CT scan   Anemia   Thrombocytopenia (HCC)   CAD (coronary artery disease)   Aortic atherosclerosis (HCC)   Assessment and Plan: * Necrotizing pneumonia (HCC) Vancomycin, cefepime, doxycycline started this morning Afebrile, normal WBC count CT as above with findings concerning for necrotizing pneumonia Pulmonology c/s in setting of necrotizing pneumonia  Urine strep, legionella Sputum cx MRSA PCR negative Solumedrol 40 mg daily Blood cultures Needs repeat imaging in about 6 weeks to ensure resolution of opacity   Acute respiratory failure with hypoxia (HCC) Related to necrotizing pneumonia below +/-  hx chronic restrictive lung disease Ct with patchy airspace consolidation in superior segment of LLL concerning for necrotizing pneumonia Volume per renal  abx as below Steroids per pulm Nebs per pulm  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 (HCC) S/p L arm brachiocephalic fistula ligation and L IJ 23 cm tunneled dialysis catheter placement 6/13 Plan for permanent access outpatient  ESRD (end stage renal disease) ([place]) TTS dialysis, next tomorrow Midodrine TID Volume per renal   Restrictive lung disease Needs pulm follow up outpatient advair at discharge  Paroxysmal atrial fibrillation ([place]) eliquis currently on hold - reevaluate with significant thrombocytopenia if worsening Will discuss with vascular  HFrEF (heart failure with reduced ejection fraction) (HCC) EF 35%, RVSF moderately reduced, severely elevated PASP Volume per renal    Type 2 diabetes mellitus (HCC) A1c 5.4 03/2021 and 10 years prior - will need to investigate history further  Chronic hypotension midodrine  CVA (cerebral vascular accident) ([place]) Hx CVA L sided weakness residual  Abnormal CT scan CT abd/pelvis for abdominal discomfort without revealing cause, but notable for unusual appearance of kidneys with innumerable lesions (see report) - possibility of aggressive renal neoplasms is not excluded -> further evaluation with non emergent abdominal MRI recommended (he can't get gadolinium with ESRD) Needs outpatient follow up  Thrombocytopenia ([place]) Will continue to monitor  Anemia downtrending with bleeding follow     DVT prophylaxis: SCD Code Status: full Family Communication: none Disposition:   Status is: Inpatient Remains inpatient appropriate because: need for IV abx, pulm c/s   Consultants:  Vascular pulmonology  Procedures:  6/13 Left arm brachiocephalic fistula ligation Left IJ  23 cm tunneled dialysis catheter placement  Antimicrobials:   Anti-infectives (From admission, onward)    Start     Dose/Rate Route Frequency Ordered Stop   06/22/21 1800  ceFEPIme (MAXIPIME) 2 g in sodium chloride 0.9 % 100 mL IVPB  Status:  Discontinued        2 g 200 mL/hr over 30 Minutes Intravenous Every T-Th-Sa (Hemodialysis) 06/21/21 1209 06/21/21 1314   06/22/21 1200  vancomycin (VANCOREADY) IVPB 750 mg/150 mL        750 mg 150 mL/hr over 60 Minutes Intravenous Every T-Th-Sa (Hemodialysis) 06/21/21 1209     06/21/21 1800  piperacillin-tazobactam (ZOSYN) IVPB 2.25 g        2.25 g 100 mL/hr over 30 Minutes Intravenous Every 8 hours 06/21/21 1314     06/21/21 1515  doxycycline (VIBRA-TABS) tablet 100 mg        100 mg Oral Every 12 hours 06/21/21 1415     06/21/21 1300  vancomycin (VANCOREADY) IVPB 1750 mg/350 mL        1,750 mg 175 mL/hr over 120 Minutes Intravenous STAT 06/21/21 1205 06/22/21 1300   06/21/21 1300  ceFEPIme (MAXIPIME) 2 g in sodium chloride 0.9 % 100 mL IVPB        2 g 200 mL/hr over 30 Minutes Intravenous STAT 06/21/21 1205 06/21/21 1334   06/20/21 0945  ceFAZolin (ANCEF) IVPB 2g/100 mL premix  Status:  Discontinued        2 g 200 mL/hr over 30 Minutes Intravenous Every 8 hours 06/20/21 0942 06/20/21 0952       Subjective: C/o sob and cough   Objective: Vitals:   06/21/21 1142 06/21/21 1144 06/21/21 1200 06/21/21 1430  BP:      Pulse: 98 (!) 125    Resp: (!) 22 (!) 23    Temp:   98.8 F (37.1 C)   TempSrc:   Axillary   SpO2: 94% (!) 88%  93%  Weight:      Height:        Intake/Output Summary (Last 24 hours) at 06/21/2021 1452 Last data filed at 06/21/2021 0700 Gross per 24 hour  Intake 680 ml  Output 500 ml  Net 180 ml   Filed Weights   06/20/21 0711 06/20/21 1523 06/20/21 1903  Weight: 75.8 kg 75.8 kg 75.3 kg    Examination:  General exam: Appears uncomfortable Respiratory system: coarse breath sounds, tachypneic, appears SOB Cardiovascular system: tachycardic, irregular Gastrointestinal  system: s/nt/nd Central nervous system: Alert and oriented. No focal neurological deficits. Extremities: no LEE   Data Reviewed: I have personally reviewed following labs and imaging studies  CBC: Recent Labs  Lab 06/20/21 0444 06/20/21 0722 06/20/21 0728 06/20/21 0814 06/20/21 1024 06/20/21 2004 06/21/21 0415 06/21/21 0930  WBC 8.3 10.4  --   --  9.0 7.3 7.1  --   HGB 13.1 11.0*   < > 10.9* 9.0* 9.0* 8.2* 8.4*  HCT 41.0 34.5*   < > 32.0* 28.2* 26.9* 24.5* 26.0*  MCV 95.6 97.2  --   --  94.9 91.5 93.2  --   PLT 75* 75*  --   --  58* 56* 51*  --    < > = values in this interval not displayed.    Basic Metabolic Panel: Recent Labs  Lab 06/20/21 0444 06/20/21 0728 06/20/21 0814 06/20/21 1024 06/20/21 2004 06/21/21 0415 06/21/21 0930  NA 137 137 138 140 135 136  --   K 5.3* 4.4 4.3 4.8 3.8  4.5  --   CL 94* 100  --  104 97* 98  --   CO2 23  --   --  20* 26 26  --   GLUCOSE 108* 118*  --  130* 155* 144*  --   BUN 90* 86*  --  87* 39* 45*  --   CREATININE 11.50* 12.70*  --  11.29* 6.04* 6.86*  --   CALCIUM 9.8  --   --  8.8* 8.4* 8.2*  --   MG  --   --   --   --  1.7  --   --   PHOS  --   --   --   --   --   --  6.9*    GFR: Estimated Creatinine Clearance: 11.7 mL/min (Kirstyn Lean) (by C-G formula based on SCr of 6.86 mg/dL (H)).  Liver Function Tests: Recent Labs  Lab 06/20/21 0444 06/20/21 1024 06/21/21 0930  AST '18 15 20  '$ ALT '13 9 10  '$ ALKPHOS 60 39 38  BILITOT 1.5* 1.7* 0.9  PROT 7.2 5.2* 5.8*  ALBUMIN 3.4* 2.7* 2.8*    CBG: Recent Labs  Lab 06/20/21 1202 06/20/21 1539 06/20/21 2232 06/21/21 0628 06/21/21 1105  GLUCAP 126* 173* 131* 152* 123*     Recent Results (from the past 240 hour(s))  Resp Panel by RT-PCR (Flu Tyran Huser&B, Covid) Anterior Nasal Swab     Status: None   Collection Time: 06/20/21  6:13 AM   Specimen: Anterior Nasal Swab  Result Value Ref Range Status   SARS Coronavirus 2 by RT PCR NEGATIVE NEGATIVE Final    Comment: (NOTE) SARS-CoV-2  target nucleic acids are NOT DETECTED.  The SARS-CoV-2 RNA is generally detectable in upper respiratory specimens during the acute phase of infection. The lowest concentration of SARS-CoV-2 viral copies this assay can detect is 138 copies/mL. Nicie Milan negative result does not preclude SARS-Cov-2 infection and should not be used as the sole basis for treatment or other patient management decisions. Sheliah Fiorillo negative result may occur with  improper specimen collection/handling, submission of specimen other than nasopharyngeal swab, presence of viral mutation(s) within the areas targeted by this assay, and inadequate number of viral copies(<138 copies/mL). Ashla Murph negative result must be combined with clinical observations, patient history, and epidemiological information. The expected result is Negative.  Fact Sheet for Patients:  EntrepreneurPulse.com.au  Fact Sheet for Healthcare Providers:  IncredibleEmployment.be  This test is no t yet approved or cleared by the [place] FDA and  has been authorized for detection and/or diagnosis of SARS-CoV-2 by FDA under an Emergency Use Authorization (EUA). This EUA will remain  in effect (meaning this test can be used) for the duration of the COVID-19 declaration under Section 564(b)(1) of the Act, 21 U.S.C.section 360bbb-3(b)(1), unless the authorization is terminated  or revoked sooner.       Influenza Jaylianna Tatlock by PCR NEGATIVE NEGATIVE Final   Influenza B by PCR NEGATIVE NEGATIVE Final    Comment: (NOTE) The Xpert Xpress SARS-CoV-2/FLU/RSV plus assay is intended as an aid in the diagnosis of influenza from Nasopharyngeal swab specimens and should not be used as Evianna Chandran sole basis for treatment. Nasal washings and aspirates are unacceptable for Xpert Xpress SARS-CoV-2/FLU/RSV testing.  Fact Sheet for Patients: EntrepreneurPulse.com.au  Fact Sheet for Healthcare  Providers: IncredibleEmployment.be  This test is not yet approved or cleared by the [place] FDA and has been authorized for detection and/or diagnosis of SARS-CoV-2 by FDA under an Emergency Use Authorization (EUA).  This EUA will remain in effect (meaning this test can be used) for the duration of the COVID-19 declaration under Section 564(b)(1) of the Act, 21 U.S.C. section 360bbb-3(b)(1), unless the authorization is terminated or revoked.  Performed at [facility], [place] [address] [place], [place] [address]   MRSA Next Gen by PCR, Nasal     Status: None   Collection Time: 06/20/21  8:39 PM   Specimen: Nasal Mucosa; Nasal Swab  Result Value Ref Range Status   MRSA by PCR Next Gen NOT DETECTED NOT DETECTED Final    Comment: (NOTE) The GeneXpert MRSA Assay (FDA approved for NASAL specimens only), is one component of Reisha Wos comprehensive MRSA colonization surveillance program. It is not intended to diagnose MRSA infection nor to guide or monitor treatment for MRSA infections. Test performance is not FDA approved in patients less than 58 years old. Performed at [facility], [place] [address], [place], [place] [address]          Radiology Studies: CT CHEST ABDOMEN PELVIS WO CONTRAST  Result Date: 06/21/2021 CLINICAL DATA:  61 year old male with history of shortness of breath. EXAM: CT CHEST, ABDOMEN AND PELVIS WITHOUT CONTRAST TECHNIQUE: Multidetector CT imaging of the chest, abdomen and pelvis was performed following the standard protocol without IV contrast. RADIATION DOSE REDUCTION: This exam was performed according to the departmental dose-optimization program which includes automated exposure control, adjustment of the mA and/or kV according to patient size and/or use of iterative reconstruction technique. COMPARISON:  High-resolution chest CT 10/08/2013. CT the abdomen and pelvis 02/13/2010. FINDINGS: CT CHEST FINDINGS Cardiovascular: Heart size is  moderately enlarged. There is no significant pericardial fluid, thickening or pericardial calcification. There is aortic atherosclerosis, as well as atherosclerosis of the great vessels of the mediastinum and the coronary arteries, including calcified atherosclerotic plaque in the left main, left anterior descending, left circumflex and right coronary arteries. Calcifications of the mitral annulus. Left internal jugular PermCath with tip terminating at the superior cavoatrial junction. Mediastinum/Nodes: No pathologically enlarged mediastinal or hilar lymph nodes. Please note that accurate exclusion of hilar adenopathy is limited on noncontrast CT scans. Esophagus is unremarkable in appearance. No axillary lymphadenopathy. Lungs/Pleura: Calcified pleural plaques are noted in the right hemithorax. No calcified pleural plaques are noted in the left hemithorax. Mild chronic generalized right-sided pleural thickening, similar to the prior study. Rounded opacity in the posterior aspect of the left lower lobe measuring approximately 6.3 x 4.2 cm (axial image 94 of series 4), centrally low-attenuation. Evaluation of the lungs is limited by considerable patient respiratory motion. With these limitations in mind, there are widespread areas of ground-glass attenuation and interlobular septal thickening, favored to reflect Jayleene Glaeser background of interstitial pulmonary edema. Additional patchy peribronchovascular areas of airspace consolidation are noted in the left lower lobe, most severe in the superior segment. Musculoskeletal: Small amount of gas in the subcutaneous fat of the upper left chest wall related to recent dialysis catheter placement. There are no aggressive appearing lytic or blastic lesions noted in the visualized portions of the skeleton. CT ABDOMEN PELVIS FINDINGS Hepatobiliary: Liver has Thomson Herbers nodular contour, indicative of underlying cirrhosis. No discrete cystic or solid hepatic lesions are confidently identified on  today's noncontrast CT examination. Numerous partially calcified gallstones are noted within the lumen of the gallbladder. Gallbladder is moderately distended. No definite pericholecystic fluid or surrounding inflammatory changes to indicate an acute cholecystitis at this time. Pancreas: No definite pancreatic mass or peripancreatic fluid collections or inflammatory changes are  noted on today's noncontrast CT examination. Spleen: Unremarkable. Adrenals/Urinary Tract: There are innumerable lesions of varying degrees of complexity in both kidneys, many of which are extensively calcified, incompletely characterized on today's non-contrast CT examination, largest of which is in the anterior aspect of the interpolar region of the left kidney (axial image 64 of series 3) measuring 6.7 cm in diameter. This largest lesion and several other lesions have clearly enlarged when compared to remote prior examination from 2012. No hydroureteronephrosis. Urinary bladder is completely decompressed. Bilateral adrenal glands are normal in appearance. Stomach/Bowel: Unenhanced appearance of the stomach is normal. No pathologic dilatation of small bowel or colon. Toby Breithaupt few scattered colonic diverticulae are noted, without surrounding inflammatory changes to suggest an acute diverticulitis at this time. The appendix is not confidently identified and may be surgically absent. Regardless, there are no inflammatory changes noted adjacent to the cecum to suggest the presence of an acute appendicitis at this time. Vascular/Lymphatic: Aortic atherosclerosis. No definite lymphadenopathy confidently identified in the abdomen or pelvis on today's noncontrast CT examination. Reproductive: Prostate gland and seminal vesicles are unremarkable in appearance. Other: Densely calcified structure in the right iliac fossa, presumably Anoop Hemmer transplant kidney. No significant volume of ascites. No pneumoperitoneum. Musculoskeletal: There are no aggressive appearing  lytic or blastic lesions noted in the visualized portions of the skeleton. IMPRESSION: 1. Rounded low-attenuation opacity in the left lower lobe, poorly evaluated on today's noncontrast CT examination. Given the adjacent patchy airspace consolidation in the superior segment of the left lower lobe, findings are concerning for potential necrotizing pneumonia. Brittinee Risk loculated pleural fluid collection is not excluded, but not strongly favored. 2. Very unusual appearance of the kidneys with innumerable lesions, many of which have clearly enlarged compared to remote prior study from 11 years ago and are complex in appearance including several which are partially calcified. The possibility of aggressive renal neoplasm(s) is not excluded, and further evaluation with nonemergent abdominal MRI with and without IV gadolinium is suggested in the near future to better evaluate these findings. 3. Cardiomegaly with evidence of interstitial pulmonary edema in the lungs; imaging findings concerning for congestive heart failure. 4. Cirrhosis. 5. Cholelithiasis without evidence of acute cholecystitis at this time. 6. Aortic atherosclerosis, in addition to left main and three-vessel coronary artery disease. Please note that although the presence of coronary artery calcium documents the presence of coronary artery disease, the severity of this disease and any potential stenosis cannot be assessed on this non-gated CT examination. Assessment for potential risk factor modification, dietary therapy or pharmacologic therapy may be warranted, if clinically indicated. 7. Additional incidental findings, as above. Electronically Signed   By: Vinnie Langton M.D.   On: 06/21/2021 10:15   DG CHEST PORT 1 VIEW  Result Date: 06/21/2021 CLINICAL DATA:  Shortness of breath, follow-up EXAM: PORTABLE CHEST 1 VIEW COMPARISON:  06/20/2021 FINDINGS: Interval placement of left IJ approach dialysis catheter. Tip overlies the central SVC. Stable  cardiomegaly. Possible pulmonary edema superimposed on chronic interstitial changes. No significant pleural effusion. No pneumothorax. IMPRESSION: No significant change. Possible pulmonary edema superimposed on chronic interstitial changes. Electronically Signed   By: Macy Mis M.D.   On: 06/21/2021 08:49   DG C-Arm 1-60 Min  Result Date: 06/20/2021 CLINICAL DATA:  Insertion of dialysis catheter EXAM: DG C-ARM 1-60 MIN COMPARISON:  Chest radiograph from earlier today. FLUOROSCOPY TIME:  Radiation Exposure Index (if provided by the fluoroscopic device): 11.8 mGy FINDINGS: Spot fluoroscopic intraoperative coned chest radiograph demonstrates left internal jugular central  venous catheter terminating over the right atrium. Endotracheal tube tip is 2.4 cm above the carina. IMPRESSION: Intraoperative fluoroscopic guidance for left internal jugular central venous catheter placement. Electronically Signed   By: Ilona Sorrel M.D.   On: 06/20/2021 09:12   DG Chest Port 1 View  Result Date: 06/20/2021 CLINICAL DATA:  61 year old male with history of shortness of breath and productive cough. EXAM: PORTABLE CHEST 1 VIEW COMPARISON:  Chest x-ray 06/07/2021. FINDINGS: Lung volumes are slightly low. Widespread patchy areas of interstitial prominence, peribronchial cuffing, patchy ill-defined opacities and areas of architectural distortion are again noted throughout the lungs bilaterally, most evident in the mid to lower lungs (right-greater-than-left) with areas of chronic pleuroparenchymal scarring and architectural distortion in the right mid to lower lung, similar to prior examinations. No definite acute consolidative airspace disease. No definite pleural effusions. No pneumothorax. Pulmonary vasculature is largely obscured. Heart size is mildly enlarged. Mediastinal contours are distorted by patient's positioning. Atherosclerotic calcifications in the thoracic aorta. IMPRESSION: 1. Grossly abnormal chest x-ray,  which is largely similar to prior examinations. Overall, imaging findings are favored to reflect interstitial lung disease and areas of chronic post infectious or inflammatory scarring (particularly in the right lung base), given the similarity to prior studies. The possibility of acute disease (edema or infection) is not excluded, but not strongly favored. These findings could be much better evaluated with follow-up high-resolution chest CT in the near future. 2. Cardiomegaly. 3. Aortic atherosclerosis. Electronically Signed   By: Vinnie Langton M.D.   On: 06/20/2021 05:30        Scheduled Meds:  arformoterol  15 mcg Nebulization BID   Chlorhexidine Gluconate Cloth  6 each Topical Q0600   Chlorhexidine Gluconate Cloth  6 each Topical Q0600   docusate sodium  100 mg Oral BID   [START ON 06/22/2021] doxercalciferol  5 mcg Intravenous Q T,Th,Sa-HD   doxycycline  100 mg Oral Q12H   methylPREDNISolone (SOLU-MEDROL) injection  40 mg Intravenous Daily   midodrine  10 mg Oral TID WC   montelukast  10 mg Oral QHS   pantoprazole  40 mg Oral Daily   potassium chloride  20-40 mEq Oral Once   revefenacin  175 mcg Nebulization Daily   sucroferric oxyhydroxide  1,000 mg Oral BID PC   Continuous Infusions:  piperacillin-tazobactam (ZOSYN)  IV     vancomycin     [START ON 06/22/2021] vancomycin       LOS: 1 day    Time spent: over 30 min    Fayrene Helper, MD [facility]   To contact the attending provider between 7A-7P or the covering provider during after hours 7P-7A, please log into the web site [email or web address] and access using universal [place] password for that web site. If you do not have the password, please call the hospital operator.  06/21/2021, 2:52 PM

## 2021-06-21 NOTE — Progress Notes (Signed)
[facility] Progress Note  Subjective: seen in room, c/o abd pain and also appears SOB. Repeat CXR done shows only chronic changes. Getting nebs Rx per RT now.   Vitals:   06/21/21 0600 06/21/21 0700 06/21/21 0730 06/21/21 0800  BP: (!) 93/48 93/64  (!) 95/47  Pulse: 82 86 88 80  Resp: (!) '23 15 18 17  '$ Temp:    98.7 F (37.1 C)  TempSrc:    Oral  SpO2: 100% 96% 96% 98%  Weight:      Height:        Exam: Gen alert, uncomfortable, RR 22 No jvd or bruits Chest gen dec'd BS bilat, no rales RRR no RG Abd soft ntnd no mass or ascites +bs Ext no edema Neuro is alert, Ox 3 , nf    LUA AVF no bruit, arm wrapped    new IJ [facility]         Home meds include - acetminophen, albuterol, apixaban, dialyvite, docusate, fluticasone-salmeterol, hydroxyzine, midodrine 10 tid, montelukast, velphoro 1 gm ac     CXR 6/13 - IMPRESSION: Widespread patchy areas of interstitial prominence, peribronchial cuffing, patchy ill-defined opacities and areas of architectural distortion are again noted throughout the lungs bilaterally, most evident in the mid to lower lungs (right-greater-than-left) with areas of chronic pleuroparenchymal scarring and architectural distortion in the right mid to lower lung, similar to prior examinations.    OP HD: SW TTS 4h  450/500  75.5kg   2/2 bath P2  TDC (AVF ligated, new TDC)   Hep none - last HD 6/10, 80.7 > 77.7kg - doxercalciferol 5 ug tiw IV - etecalcetide 7.5 mg tiw IV - no esa     Assessment/ Plan: Bleeding AVF - sp AVF ligation by VVS 6/13. New TDC placed. Next permanent access to be planned in OP setting.  ESRD - on HD TTS.  HD here yesterday, next HD tomorrow.  Resp - has COPD and chronic lung scarring on CXR's here and prior admits. Repeat today is unchanged from prior. Not sure if on home O2 or not. Per pmd.  BP/ volume - on midodrine 10 tid, continuing here. No vol excess on exam, chronic chg's on xray, doubt edema. Unable to pull fluid w/ bp  drops on hd yesterday, is at dry wt.  Anemia esrd + abl - transfuse prn. Not on esa at op unit.  MBD ckd - CCa in range, added phos. Cont velphoro as binder, cont vdra IV.       Rob Roald Lukacs 06/21/2021, 9:30 AM   Recent Labs  Lab 06/20/21 0444 06/20/21 0722 06/20/21 1024 06/20/21 2004 06/21/21 0415  HGB 13.1   < > 9.0* 9.0* 8.2*  ALBUMIN 3.4*  --  2.7*  --   --   CALCIUM 9.8  --  8.8* 8.4* 8.2*  CREATININE 11.50*   < > 11.29* 6.04* 6.86*  K 5.3*   < > 4.8 3.8 4.5   < > = values in this interval not displayed.   Inpatient medications:  Chlorhexidine Gluconate Cloth  6 each Topical Q0600   docusate sodium  100 mg Oral BID   [START ON 06/22/2021] doxercalciferol  5 mcg Intravenous Q T,Th,Sa-HD   insulin aspart  0-6 Units Subcutaneous TID WC   midodrine  10 mg Oral TID WC   mometasone-formoterol  2 puff Inhalation BID   montelukast  10 mg Oral QHS   pantoprazole  40 mg Oral Daily   potassium chloride  20-40 mEq  Oral Once   sucroferric oxyhydroxide  1,000 mg Oral BID PC    acetaminophen **OR** acetaminophen, albuterol, alum & mag hydroxide-simeth, HYDROmorphone (DILAUDID) injection, labetalol, metoprolol tartrate, ondansetron, oxyCODONE-acetaminophen, phenol, senna-docusate

## 2021-06-21 NOTE — Progress Notes (Addendum)
Pt receives out-pt HD at [facility] SW on TTS. Pt arrives at 7:00 for 7:20 chair time. Will assist as needed.   Melven Sartorius Renal Navigator [phone]

## 2021-06-21 NOTE — Assessment & Plan Note (Addendum)
A1c 5.4 03/2021 and 10 years prior  Unclear history, would follow outpatient

## 2021-06-21 NOTE — Assessment & Plan Note (Addendum)
Negative MRSA PCR Respiratory culture with normal resp flora Afebrile, elevated wbc in setting of steroids CT as above with findings concerning for necrotizing pneumonia Pulmonology c/s in setting of necrotizing pneumonia  Urine strep, legionella (he may not be able to give urine sample) MRSA PCR negative Blood cultures NGx4 Continue IV abx inpatient, will discharge with augmentin and doxycycline as well as Nathaniel White few more days of steroids Needs repeat imaging in about 6 weeks to ensure resolution of opacity

## 2021-06-21 NOTE — Assessment & Plan Note (Signed)
Hx CVA L sided weakness residual

## 2021-06-21 NOTE — Assessment & Plan Note (Addendum)
TTS dialysis Midodrine TID Volume/lytes per renal (K high today, had labs during HD)

## 2021-06-21 NOTE — Consult Note (Signed)
NAME:  [name], MRN:  [id number], DOB:  [birth year], LOS: 1 ADMISSION DATE:  06/20/2021, CONSULTATION DATE:  06/21/21 REFERRING MD:  Nathaniel White, CHIEF COMPLAINT:  abnormal CT   History of Present Illness:  Nathaniel White is a  61 y/o gentleman with a history of ESRD on HD, HFrEF, COPD, former tobacco abuse who presented with a cough but developed life-threatening bleeding from his AV fistula. He underwent ligation of his fistula on 6/13 with tunneled dialysis catheter placement in his LIJ. This morning he complained of SOB and had abdominal pain, prompting a CT scan of his C/A/P to be performed that demonstrate a LLL pneumonia. PCCM was consulted for evaluation and management. He complains of coughing and sputum production for several days PTA. He has COPD which is managed with Advair 250. He quit smoking about 4 years ago.   Pertinent  Medical History  ESRD Cirrhosis with varices HTN CVA Tobacco abuse DM HFrEF, moderate MR, severe PH by echo PEA cardiac arrest (intra-op) COPD  Significant Hospital Events: Including procedures, antibiotic start and stop dates in addition to other pertinent events   6/13 admitted, fistula ligation 6/14 started antibiotics for pneumonia  Interim History / Subjective:    Objective   Blood pressure (!) 92/49, pulse (!) 125, temperature 98.8 F (37.1 C), temperature source Axillary, resp. rate (!) 23, height '5\' 9"'$  (1.753 m), weight 75.3 kg, SpO2 (!) 88 %.        Intake/Output Summary (Last 24 hours) at 06/21/2021 1309 Last data filed at 06/21/2021 0700 Gross per 24 hour  Intake 680 ml  Output 500 ml  Net 180 ml   Filed Weights   06/20/21 0711 06/20/21 1523 06/20/21 1903  Weight: 75.8 kg 75.8 kg 75.3 kg    Examination: General: chronically ill appearing man lying in bed in NAD HENT: [name], eyes anicteric Lungs: rhales bilaterally, rhonchi in the left base Cardiovascular: S1S2, tachycardic, irreg rhythm Abdomen: soft, NT Extremities:  bandage over incision in LUE with minimal dried blood Neuro: awake, alert, globally weak but moving all extremities, right side more briskly than the left. Derm: warm, dry, no diffuse rashes  Echo LVEF 35%, global hypokinesis, RV moderately enlarged with moderately reduced function. Severely elevated PASP.  CTA personally reviewed> bilateral pulmonary edema, cardiomegaly. Posterior LLL infiltrate w/o air bronchograms, less likely loculated pleural effusion.  BUN 45 Cr 6.86 Covid negative LA 2.3  PFT 2015: FVC 1.62 (44%) FEV1  1.34 (45%) Ratio 83% TLC 55% DLCO 40%   Resolved Hospital Problem list   Bleeding from ulcerated AV fistula  Assessment & Plan:  LLL pneumonia, most likely community acquired pneumonia with history of coughing pre-dating admission.  -Doxycycline, zosyn, vanc; can likely d/c vanc quickly since MRSA nares negative yesterday. Needs 7 days of B-lactam therapy. -respiratory culture -legionella and pneumococcus urinary antigens pending -solumedrol '40mg'$  daily -NTS PRN, CPT TID -pulmonary hygiene -Needs follow up imaging in ~6 weeks as an outpatient to ensure full resolution of opacity; if persistent additional workup will be required.  Acute respiratory failure with hypoxia- likely due to CAP and acute pulmonary edema Chronic restrictive lung disease, no COPD based on PFTs -volume management with HD -antibiotics -supplemental O2 to maintain SpO2>90% -steroids, brovana, yupelri> may resume Advair at d/c  ESRD on HD Ulcerated dialysis fistula, ligated, now with [facility] -HD per nephrology  Chronic anemia duet o ESRD and acute blood loss anemia -transfuse for Hb < 7or hemodynamically significant bleeding  Afib, on eliquis PTA -monitor on  tele -per primary  PCCM will sign off. Please call with questions.  Best Practice (right click and "Reselect all SmartList Selections" daily)   Per primary  Labs   CBC: Recent Labs  Lab 06/20/21 0444 06/20/21 0722  06/20/21 0728 06/20/21 1024 06/20/21 2004 06/21/21 0415 06/21/21 0930  WBC 8.3 10.4  --  9.0 7.3 7.1  --   HGB 13.1 11.0* 11.6* 9.0* 9.0* 8.2* 8.4*  HCT 41.0 34.5* 34.0* 28.2* 26.9* 24.5* 26.0*  MCV 95.6 97.2  --  94.9 91.5 93.2  --   PLT 75* 75*  --  58* 56* 51*  --     Basic Metabolic Panel: Recent Labs  Lab 06/20/21 0444 06/20/21 0728 06/20/21 1024 06/20/21 2004 06/21/21 0415 06/21/21 0930  NA 137 137 140 135 136  --   K 5.3* 4.4 4.8 3.8 4.5  --   CL 94* 100 104 97* 98  --   CO2 23  --  20* 26 26  --   GLUCOSE 108* 118* 130* 155* 144*  --   BUN 90* 86* 87* 39* 45*  --   CREATININE 11.50* 12.70* 11.29* 6.04* 6.86*  --   CALCIUM 9.8  --  8.8* 8.4* 8.2*  --   MG  --   --   --  1.7  --   --   PHOS  --   --   --   --   --  6.9*   GFR: Estimated Creatinine Clearance: 11.7 mL/min (A) (by C-G formula based on SCr of 6.86 mg/dL (H)). Recent Labs  Lab 06/20/21 0722 06/20/21 1024 06/20/21 2004 06/21/21 0415  WBC 10.4 9.0 7.3 7.1    Liver Function Tests: Recent Labs  Lab 06/20/21 0444 06/20/21 1024 06/21/21 0930  AST '18 15 20  '$ ALT '13 9 10  '$ ALKPHOS 60 39 38  BILITOT 1.5* 1.7* 0.9  PROT 7.2 5.2* 5.8*  ALBUMIN 3.4* 2.7* 2.8*   Recent Labs  Lab 06/21/21 0930  LIPASE 43   No results for input(s): "AMMONIA" in the last 168 hours.  ABG    Component Value Date/Time   PHART 7.440 03/10/2021 2118   PCO2ART 47.9 03/10/2021 2118   PO2ART 199 (H) 03/10/2021 2118   HCO3 32.6 (H) 03/10/2021 2118   TCO2 22 06/20/2021 0728   O2SAT 100 03/10/2021 2118     Coagulation Profile: Recent Labs  Lab 06/20/21 0444 06/20/21 1024  INR 1.4* 1.2    Cardiac Enzymes: No results for input(s): "CKTOTAL", "CKMB", "CKMBINDEX", "TROPONINI" in the last 168 hours.  HbA1C: Hgb A1c MFr Bld  Date/Time Value Ref Range Status  03/10/2021 04:19 PM 5.4 4.8 - 5.6 % Final    Comment:    (NOTE) Pre diabetes:          5.7%-6.4%  Diabetes:              >6.4%  Glycemic control for    <7.0% adults with diabetes   08/13/2011 01:00 PM 5.4 <5.7 % Final    Comment:    (NOTE)                                                                       According to the [employer]  Recommendations for 2011, when HbA1c is used as a screening test:  >=6.5%   Diagnostic of Diabetes Mellitus           (if abnormal result is confirmed) 5.7-6.4%   Increased risk of developing Diabetes Mellitus References:Diagnosis and Classification of Diabetes Mellitus,Diabetes VOHY,0737,10(GYIRS 1):S62-S69 and Standards of Medical Care in         Diabetes - 2011,Diabetes WNIO,2703,50 (Suppl 1):S11-S61.    CBG: Recent Labs  Lab 06/20/21 1202 06/20/21 1539 06/20/21 2232 06/21/21 0628 06/21/21 1105  GLUCAP 126* 173* 131* 152* 123*    Review of Systems:   Review of Systems  Constitutional:  Negative for chills and fever.  HENT: Negative.    Respiratory:  Positive for cough, sputum production and shortness of breath.   Cardiovascular:  Negative for chest pain and leg swelling.  Gastrointestinal:  Negative for heartburn.  Musculoskeletal: Negative.   Skin:  Negative for rash.     Past Medical History:  He,  has a past medical history of Anemia, Anxiety, Asthma, CHF (congestive heart failure) ([place]), Complication from renal dialysis device (09/38/1829), Complication of anesthesia (2017), COPD (chronic obstructive pulmonary disease) ([place]), Diabetes mellitus without complication ([place]), ESOPHAGEAL VARICES (10/04/2008), ESRD, Hemiparesis due to old stroke ([place]), Hypertension, LV dysfunction, Memory loss due to medical condition, MR (mitral regurgitation), Peripheral vascular disease ([place]), Shortness of breath, Stroke ([place]), and Tobacco abuse.   Surgical History:   Past Surgical History:  Procedure Laterality Date   ABDOMINAL AORTOGRAM W/LOWER EXTREMITY Bilateral 07/21/2018   Procedure: ABDOMINAL AORTOGRAM W/LOWER EXTREMITY;  Surgeon: Waynetta Sandy, MD;  Location: [place] [facility];  Service: Cardiovascular;  Laterality: Bilateral;   AMPUTATION Left 11/05/2018   Procedure: AMPUTATION SECOND TOE LEFT FOOT;  Surgeon: Waynetta Sandy, MD;  Location: [place];  Service: Vascular;  Laterality: Left;   AMPUTATION Left 02/13/2021   Procedure: Left small finger AMPUTATION DIGIT;  Surgeon: Sherilyn Cooter, MD;  Location: [place];  Service: Orthopedics;  Laterality: Left;   AV FISTULA PLACEMENT     CARDIAC CATHETERIZATION     [place] [facility]   COLONOSCOPY W/ BIOPSIES AND POLYPECTOMY     FISTULA SUPERFICIALIZATION Left 11/10/2013   Procedure: FISTULA PLICATION;  Surgeon: Serafina Mitchell, MD;  Location: [place];  Service: Vascular;  Laterality: Left;   INSERTION OF DIALYSIS CATHETER N/A 06/20/2021   Procedure: INSERTION OF DIALYSIS CATHETER;  Surgeon: Broadus John, MD;  Location: [facility] OR;  Service: Vascular;  Laterality: N/A;   KIDNEY TRANSPLANT     2011 rejected kidney 2012 back on dialysis   LEFT AND RIGHT HEART CATHETERIZATION WITH CORONARY ANGIOGRAM N/A 10/09/2013   Procedure: LEFT AND RIGHT HEART CATHETERIZATION WITH CORONARY ANGIOGRAM;  Surgeon: Burnell Blanks, MD;  Location: [facility] CATH LAB;  Service: Cardiovascular;  Laterality: N/A;   LIGATION OF ARTERIOVENOUS  FISTULA Left 06/20/2021   Procedure: LIGATION OF ARTERIOVENOUS  FISTULA;  Surgeon: Broadus John, MD;  Location: [place];  Service: Vascular;  Laterality: Left;   PERIPHERAL VASCULAR INTERVENTION Left 07/21/2018   Procedure: PERIPHERAL VASCULAR INTERVENTION;  Surgeon: Waynetta Sandy, MD;  Location: [place] [facility];  Service: Cardiovascular;  Laterality: Left;  Popliteal   REVISON OF ARTERIOVENOUS FISTULA Left 08/26/2013   Procedure: EXCISION OF ERODED SKIN AND EXPLORATION OF MAIN LEFT UPPER ARM AV FISTULA;  Surgeon: Rosetta Posner, MD;  Location: [place];  Service: Vascular;  Laterality: Left;   REVISON OF ARTERIOVENOUS FISTULA Left 02/05/2014   Procedure: REPAIR OF  ARTERIOVENOUS  FISTULA ANEURYSM;  Surgeon: Serafina Mitchell, MD;  Location: [facility] OR;  Service: Vascular;  Laterality: Left;   REVISON OF ARTERIOVENOUS FISTULA Left 03/10/2021   Procedure: LEFT ARM REVISION OF ARTERIOVENOUS FISTULA WITH PLICATION;  Surgeon: Waynetta Sandy, MD;  Location: [place];  Service: Vascular;  Laterality: Left;   SHUNTOGRAM N/A 11/05/2012   Procedure: Fistulogram;  Surgeon: Serafina Mitchell, MD;  Location: [facility] CATH LAB;  Service: Cardiovascular;  Laterality: N/A;   TEE WITHOUT CARDIOVERSION  08/17/2011   Procedure: TRANSESOPHAGEAL ECHOCARDIOGRAM (TEE);  Surgeon: Larey Dresser, MD;  Location: [place];  Service: Cardiovascular;  Laterality: N/A;   ULTRASOUND GUIDANCE FOR VASCULAR ACCESS  06/20/2021   Procedure: ULTRASOUND GUIDANCE FOR VASCULAR ACCESS;  Surgeon: Broadus John, MD;  Location: [place];  Service: Vascular;;   VIDEO ASSISTED THORACOSCOPY (VATS)/DECORTICATION  08/10/11     Social History:   reports that he quit smoking about 9 years ago. His smoking use included cigarettes. He has a 7.50 pack-year smoking history. He has never used smokeless tobacco. He reports that he does not drink alcohol and does not use drugs.   Family History:  His family history includes CAD in his paternal uncle; Cancer in his paternal grandfather; Diabetes in his paternal grandmother; Hypertension in his mother; Varicose Veins in his mother.   Allergies Allergies  Allergen Reactions   Codeine Shortness Of Breath   Dextromethorphan-Guaifenesin Shortness Of Breath   Iron Dextran Shortness Of Breath   Morphine And Related Shortness Of Breath     Home Medications  Prior to Admission medications   Medication Sig Start Date End Date Taking? Authorizing Provider  acetaminophen (TYLENOL) 500 MG tablet Take 1 tablet (500 mg total) by mouth every 6 (six) hours as needed. Patient taking differently: Take 500 mg by mouth every 6 (six) hours as needed for mild pain or headache. 04/11/18  Yes Burky,  Malachy Moan, NP  albuterol (PROAIR HFA) 108 (90 Base) MCG/ACT inhaler Inhale 2 puffs into the lungs every 6 (six) hours as needed for wheezing or shortness of breath. 06/07/21  Yes Gildardo Pounds, NP  apixaban (ELIQUIS) 2.5 MG TABS tablet Take 1 tablet (2.5 mg total) by mouth 2 (two) times daily. 04/25/21  Yes Burnell Blanks, MD  B Complex-C-Folic Acid (DIALYVITE 672) 0.8 MG TABS Take 1 tablet by mouth daily. 04/14/21  Yes [provider]  docusate sodium (COLACE) 100 MG capsule Take 1 capsule (100 mg total) by mouth 2 (two) times daily. Patient taking differently: Take 100 mg by mouth 2 (two) times daily as needed for mild constipation. 03/15/21  Yes Sheikh, Omair Latif, DO  fluticasone-salmeterol (ADVAIR DISKUS) 250-50 MCG/ACT AEPB Inhale 1 puff into the lungs in the morning and at bedtime. 06/07/21  Yes Gildardo Pounds, NP  hydrOXYzine (ATARAX) 10 MG tablet Take 1 tablet (10 mg total) by mouth 3 (three) times daily as needed. For anxiety Patient taking differently: Take 10 mg by mouth 3 (three) times daily as needed for anxiety. 06/07/21  Yes Gildardo Pounds, NP  midodrine (PROAMATINE) 10 MG tablet Place 1 tablet (10 mg total) into feeding tube 3 (three) times daily with meals. Patient taking differently: Take 10 mg by mouth 3 (three) times daily with meals. 03/15/21  Yes Sheikh, Omair Latif, DO  montelukast (SINGULAIR) 10 MG tablet Take 1 tablet (10 mg total) by mouth at bedtime. 06/07/21  Yes Gildardo Pounds, NP  VELPHORO 500 MG chewable tablet Chew 1,000  mg by mouth 2 (two) times daily after a meal. 02/06/21  Yes [provider]  VISINE ADVANCED RELIEF 0.05-0.1-1-1 % SOLN Place 1 drop into both eyes 2 (two) times daily as needed (for dryness).   Yes [provider]  glucose blood test strip  12/26/09   [provider]     Critical care time:       Julian Hy, DO 06/21/21 2:26 PM [facility]

## 2021-06-21 NOTE — Assessment & Plan Note (Signed)
EF 35%, RVSF moderately reduced, severely elevated PASP Volume per renal

## 2021-06-21 NOTE — Assessment & Plan Note (Addendum)
Related to necrotizing pneumonia below +/- hx chronic restrictive lung disease Ct with patchy airspace consolidation in superior segment of LLL concerning for necrotizing pneumonia Volume per renal - they want to do an extra dialysis session due to concern for overload starting Monday, dispo pending when they think he's more ready abx as below Steroids per pulm Nebs per pulm Hypoxia resolved

## 2021-06-21 NOTE — Assessment & Plan Note (Signed)
Needs pulm follow up outpatient advair at discharge

## 2021-06-21 NOTE — Progress Notes (Addendum)
  Progress Note    06/21/2021 8:17 AM 1 Day Post-Op  Subjective: No complaints   Vitals:   06/21/21 0730 06/21/21 0800  BP:  (!) 95/47  Pulse: 88 80  Resp: 18 17  Temp:    SpO2: 96% 98%   Physical Exam: Lungs:  non labored Incisions:  L arm incision c/d/I without hematoma Extremities:  L hand well perfused Neurologic: A&O  CBC    Component Value Date/Time   WBC 7.1 06/21/2021 0415   RBC 2.63 (L) 06/21/2021 0415   HGB 8.2 (L) 06/21/2021 0415   HGB 14.3 06/07/2021 1621   HGB 11.5 (L) 01/17/2015 1454   HCT 24.5 (L) 06/21/2021 0415   HCT 43.6 06/07/2021 1621   HCT 35.7 (L) 01/17/2015 1454   PLT 51 (L) 06/21/2021 0415   PLT 67 (LL) 06/07/2021 1621   MCV 93.2 06/21/2021 0415   MCV 92 06/07/2021 1621   MCV 96.5 01/17/2015 1454   MCH 31.2 06/21/2021 0415   MCHC 33.5 06/21/2021 0415   RDW 17.7 (H) 06/21/2021 0415   RDW 15.2 06/07/2021 1621   RDW 15.5 (H) 01/17/2015 1454   LYMPHSABS 0.8 06/07/2021 1621   LYMPHSABS 0.7 (L) 01/17/2015 1454   MONOABS 0.5 03/15/2021 0848   MONOABS 0.3 01/17/2015 1454   EOSABS 0.4 06/07/2021 1621   BASOSABS 0.1 06/07/2021 1621   BASOSABS 0.0 01/17/2015 1454    BMET    Component Value Date/Time   NA 136 06/21/2021 0415   NA 141 06/07/2021 1621   NA 142 01/17/2015 1454   K 4.5 06/21/2021 0415   K 4.2 01/17/2015 1454   CL 98 06/21/2021 0415   CO2 26 06/21/2021 0415   CO2 28 01/17/2015 1454   GLUCOSE 144 (H) 06/21/2021 0415   GLUCOSE 96 01/17/2015 1454   BUN 45 (H) 06/21/2021 0415   BUN 44 (H) 06/07/2021 1621   BUN 46.9 (H) 01/17/2015 1454   CREATININE 6.86 (H) 06/21/2021 0415   CREATININE 9.9 (HH) 01/17/2015 1454   CALCIUM 8.2 (L) 06/21/2021 0415   CALCIUM 10.2 01/17/2015 1454   GFRNONAA 9 (L) 06/21/2021 0415   GFRAA 5 (L) 03/03/2018 2303    INR    Component Value Date/Time   INR 1.2 06/20/2021 1024     Intake/Output Summary (Last 24 hours) at 06/21/2021 0817 Last data filed at 06/21/2021 0700 Gross per 24 hour   Intake 1780 ml  Output 500 ml  Net 1280 ml     Assessment/Plan:  61 y.o. male is s/p ligation of L arm AVF and TDC placement 1 Day Post-Op   Left arm healing well after fistula ligation due to spontaneous hemorrhage TDC working well for HD yesterday Ok for discharge from vascular surgery standpoint We will plan next permanent access as an outpatient   Dagoberto Ligas, PA-C [facility] [phone] 06/21/2021 8:17 AM  VASCULAR STAFF ADDENDUM: I have independently interviewed and examined the patient. I agree with the above.  Will see in follow up.  Cassandria Santee, MD [facility] Phone Number: [phone] 06/21/2021 9:42 AM

## 2021-06-21 NOTE — Assessment & Plan Note (Addendum)
CT abd/pelvis for abdominal discomfort without revealing cause, but notable for unusual appearance of kidneys with innumerable lesions (see report) - possibility of aggressive renal neoplasms is not excluded -> further evaluation with non emergent abdominal MRI recommended (he can't get gadolinium with ESRD) Needs outpatient follow up

## 2021-06-21 NOTE — Assessment & Plan Note (Addendum)
eliquis currently on hold - given severe thrombocytopenia, will continue to hold until platelets >50,000.  Needs follow up with cardiology and hematology outpatient.  Will plan to resume once >50,000. Renal to follow platelets weekly. Downtrending, will hold eliquis for now

## 2021-06-22 DIAGNOSIS — R410 Disorientation, unspecified: Secondary | ICD-10-CM

## 2021-06-22 DIAGNOSIS — J850 Gangrene and necrosis of lung: Secondary | ICD-10-CM | POA: Diagnosis not present

## 2021-06-22 LAB — CBC WITH DIFFERENTIAL/PLATELET
Abs Immature Granulocytes: 0.09 10*3/uL — ABNORMAL HIGH (ref 0.00–0.07)
Basophils Absolute: 0 10*3/uL (ref 0.0–0.1)
Basophils Relative: 0 %
Eosinophils Absolute: 0 10*3/uL (ref 0.0–0.5)
Eosinophils Relative: 0 %
HCT: 26 % — ABNORMAL LOW (ref 39.0–52.0)
Hemoglobin: 8.7 g/dL — ABNORMAL LOW (ref 13.0–17.0)
Immature Granulocytes: 1 %
Lymphocytes Relative: 3 %
Lymphs Abs: 0.4 10*3/uL — ABNORMAL LOW (ref 0.7–4.0)
MCH: 32.1 pg (ref 26.0–34.0)
MCHC: 33.5 g/dL (ref 30.0–36.0)
MCV: 95.9 fL (ref 80.0–100.0)
Monocytes Absolute: 0.8 10*3/uL (ref 0.1–1.0)
Monocytes Relative: 6 %
Neutro Abs: 11.8 10*3/uL — ABNORMAL HIGH (ref 1.7–7.7)
Neutrophils Relative %: 90 %
Platelets: 40 10*3/uL — ABNORMAL LOW (ref 150–400)
RBC: 2.71 MIL/uL — ABNORMAL LOW (ref 4.22–5.81)
RDW: 17.7 % — ABNORMAL HIGH (ref 11.5–15.5)
WBC: 13 10*3/uL — ABNORMAL HIGH (ref 4.0–10.5)
nRBC: 0 % (ref 0.0–0.2)

## 2021-06-22 LAB — COMPREHENSIVE METABOLIC PANEL
ALT: 8 U/L (ref 0–44)
AST: 27 U/L (ref 15–41)
Albumin: 2.7 g/dL — ABNORMAL LOW (ref 3.5–5.0)
Alkaline Phosphatase: 35 U/L — ABNORMAL LOW (ref 38–126)
Anion gap: 15 (ref 5–15)
BUN: 68 mg/dL — ABNORMAL HIGH (ref 6–20)
CO2: 21 mmol/L — ABNORMAL LOW (ref 22–32)
Calcium: 8.2 mg/dL — ABNORMAL LOW (ref 8.9–10.3)
Chloride: 93 mmol/L — ABNORMAL LOW (ref 98–111)
Creatinine, Ser: 9.36 mg/dL — ABNORMAL HIGH (ref 0.61–1.24)
GFR, Estimated: 6 mL/min — ABNORMAL LOW (ref >60)
Glucose, Bld: 114 mg/dL — ABNORMAL HIGH (ref 70–99)
Potassium: 5.7 mmol/L — ABNORMAL HIGH (ref 3.5–5.1)
Sodium: 129 mmol/L — ABNORMAL LOW (ref 135–145)
Total Bilirubin: 1.1 mg/dL (ref 0.3–1.2)
Total Protein: 5.7 g/dL — ABNORMAL LOW (ref 6.5–8.1)

## 2021-06-22 LAB — MAGNESIUM: Magnesium: 2.1 mg/dL (ref 1.7–2.4)

## 2021-06-22 LAB — GLUCOSE, CAPILLARY
Glucose-Capillary: 92 mg/dL (ref 70–99)
Glucose-Capillary: 85 mg/dL (ref 70–99)
Glucose-Capillary: 89 mg/dL (ref 70–99)
Glucose-Capillary: 148 mg/dL — ABNORMAL HIGH (ref 70–99)

## 2021-06-22 LAB — PHOSPHORUS: Phosphorus: 8.1 mg/dL — ABNORMAL HIGH (ref 2.5–4.6)

## 2021-06-22 NOTE — Assessment & Plan Note (Addendum)
resolved 

## 2021-06-22 NOTE — Progress Notes (Signed)
  Progress Note    06/22/2021 12:35 PM 2 Days Post-Op  Subjective: Left arm feeling better, he is alert this morning  Vitals:   06/22/21 1100 06/22/21 1200  BP: 114/71   Pulse: 62   Resp: 17   Temp:  97.7 F (36.5 C)  SpO2: 98%     Physical Exam: Awake and alert Left arm with minimal hematoma and left hand is warm and well-perfused  CBC    Component Value Date/Time   WBC 13.0 (H) 06/22/2021 0501   RBC 2.71 (L) 06/22/2021 0501   HGB 8.7 (L) 06/22/2021 0501   HGB 14.3 06/07/2021 1621   HGB 11.5 (L) 01/17/2015 1454   HCT 26.0 (L) 06/22/2021 0501   HCT 43.6 06/07/2021 1621   HCT 35.7 (L) 01/17/2015 1454   PLT 40 (L) 06/22/2021 0501   PLT 67 (LL) 06/07/2021 1621   MCV 95.9 06/22/2021 0501   MCV 92 06/07/2021 1621   MCV 96.5 01/17/2015 1454   MCH 32.1 06/22/2021 0501   MCHC 33.5 06/22/2021 0501   RDW 17.7 (H) 06/22/2021 0501   RDW 15.2 06/07/2021 1621   RDW 15.5 (H) 01/17/2015 1454   LYMPHSABS 0.4 (L) 06/22/2021 0501   LYMPHSABS 0.8 06/07/2021 1621   LYMPHSABS 0.7 (L) 01/17/2015 1454   MONOABS 0.8 06/22/2021 0501   MONOABS 0.3 01/17/2015 1454   EOSABS 0.0 06/22/2021 0501   EOSABS 0.4 06/07/2021 1621   BASOSABS 0.0 06/22/2021 0501   BASOSABS 0.1 06/07/2021 1621   BASOSABS 0.0 01/17/2015 1454    BMET    Component Value Date/Time   NA 129 (L) 06/22/2021 0501   NA 141 06/07/2021 1621   NA 142 01/17/2015 1454   K 5.7 (H) 06/22/2021 0501   K 4.2 01/17/2015 1454   CL 93 (L) 06/22/2021 0501   CO2 21 (L) 06/22/2021 0501   CO2 28 01/17/2015 1454   GLUCOSE 114 (H) 06/22/2021 0501   GLUCOSE 96 01/17/2015 1454   BUN 68 (H) 06/22/2021 0501   BUN 44 (H) 06/07/2021 1621   BUN 46.9 (H) 01/17/2015 1454   CREATININE 9.36 (H) 06/22/2021 0501   CREATININE 9.9 (HH) 01/17/2015 1454   CALCIUM 8.2 (L) 06/22/2021 0501   CALCIUM 10.2 01/17/2015 1454   GFRNONAA 6 (L) 06/22/2021 0501   GFRAA 5 (L) 03/03/2018 2303    INR    Component Value Date/Time   INR 1.2 06/20/2021  1024     Intake/Output Summary (Last 24 hours) at 06/22/2021 1235 Last data filed at 06/22/2021 0800 Gross per 24 hour  Intake 870 ml  Output --  Net 870 ml     Assessment/plan:  61 y.o. male is s/p left upper arm fistula ligation for bleeding and placement of tunneled dialysis catheter.  We will defer permanent access until patient has recovered.    Cattie Tineo C. Donzetta Matters, MD [facility] Office: [phone] Pager: [phone]  06/22/2021 12:35 PM

## 2021-06-22 NOTE — Progress Notes (Signed)
[facility] Progress Note  Subjective: seen in room, feeling better today. Was hallucinating overnight per RN, but lucid this am.  No c/o's.   Vitals:   06/22/21 0600 06/22/21 0700 06/22/21 0800 06/22/21 0820  BP: 109/64 (!) 102/58 95/65   Pulse: 60 60 (!) 58   Resp: '14 13 15   '$ Temp:   97.6 F (36.4 C)   TempSrc:   Oral   SpO2: 100% 100% 100% 100%  Weight:      Height:        Exam: Gen alert, nasal O2, no distress No jvd or bruits Chest gen dec'd BS bilat, no rales RRR no RG Abd soft ntnd no mass or ascites +bs Ext no edema Neuro is alert, Ox 3 , nf    LUA AVF no bruit, staples in place, wound is clean    new IJ [facility]         Home meds include - acetminophen, albuterol, apixaban, dialyvite, docusate, fluticasone-salmeterol, hydroxyzine, midodrine 10 tid, montelukast, velphoro 1 gm ac     CXR 6/13 - IMPRESSION: Widespread patchy areas of interstitial prominence, peribronchial cuffing, patchy ill-defined opacities and areas of architectural distortion are again noted throughout the lungs bilaterally, most evident in the mid to lower lungs (right-greater-than-left) with areas of chronic pleuroparenchymal scarring and architectural distortion in the right mid to lower lung, similar to prior examinations.    OP HD: SW TTS 4h  450/500  75.5kg   2/2 bath P2  TDC (AVF ligated, new TDC)   Hep none - last HD 6/10, 80.7 > 77.7kg - doxercalciferol 5 ug tiw IV - etecalcetide 7.5 mg tiw IV - no esa     Assessment/ Plan: Bleeding AVF - sp AVF ligation by [facility] 6/13. New TDC placed. Next permanent access to be planned in OP setting per [facility].  AMS - hallucinations overnight per RN.  ESRD - on HD TTS.  HD today.  Resp - has COPD and CXR shows chronic lung scarring from here and prior admits. Not sure if on home O2 or not. Per pmd.  BP/ volume - on midodrine 10 tid, continuing here. No vol excess on exam, chronic chg's on xray, doubt edema. Unable to pull fluid w/ bp drops on  hd 6/13. Small UF goal HD today.  Anemia esrd + abl - transfuse prn. Not on esa at op unit.  MBD ckd - CCa in range, phos 8s. Cont velphoro as binder, cont vdra IV.       Nathaniel White 06/22/2021, 11:01 AM   Recent Labs  Lab 06/21/21 0415 06/21/21 0930 06/22/21 0501  HGB 8.2* 8.4* 8.7*  ALBUMIN  --  2.8* 2.7*  CALCIUM 8.2*  --  8.2*  PHOS  --  6.9* 8.1*  CREATININE 6.86*  --  9.36*  K 4.5  --  5.7*    Inpatient medications:  arformoterol  15 mcg Nebulization BID   Chlorhexidine Gluconate Cloth  6 each Topical Q0600   Chlorhexidine Gluconate Cloth  6 each Topical Q0600   docusate sodium  100 mg Oral BID   doxercalciferol  5 mcg Intravenous Q T,Th,Sa-HD   doxycycline  100 mg Oral Q12H   methylPREDNISolone (SOLU-MEDROL) injection  40 mg Intravenous Daily   midodrine  10 mg Oral TID WC   montelukast  10 mg Oral QHS   pantoprazole  40 mg Oral Daily   potassium chloride  20-40 mEq Oral Once   revefenacin  175 mcg Nebulization Daily  sucroferric oxyhydroxide  1,000 mg Oral BID PC    piperacillin-tazobactam (ZOSYN)  IV 2.25 g (06/22/21 0626)   vancomycin     acetaminophen **OR** acetaminophen, albuterol, alum & mag hydroxide-simeth, HYDROmorphone (DILAUDID) injection, labetalol, metoprolol tartrate, ondansetron, oxyCODONE-acetaminophen, phenol, senna-docusate

## 2021-06-22 NOTE — Progress Notes (Signed)
TRH night cross cover note:  I was notified by RN of the patient's confusion this evening, which appears worse relative to day shift.   Per my chart review, including review of most recent rounding hospitalist progress note, this is a 62 year old male with end-stage renal disease on hemodialysis on Tuesday, Thursday, Saturday schedule as well as diabetes mellitus, ischemic CVA with residual left hemiparesis, and atrial fibrillation chronically anticoagulated on Eliquis, who was originally admitted with bleeding left AV fistula status post ligation and excision, with ensuing hospital course notable for diagnosis of pneumonia complicated by acute hypoxic respiratory failure, prompting pulm critical care consultation.  Compared to day shift, vital signs stable.  Remains on broad-spectrum IV antibiotics.  Differential for patient's confusion is broad given the nature of his hospitalization with suspected acute infection in the form of pneumonia superimposed on multiple comorbidities as above, including potential for acute metabolic encephalopathy in the context of this underlying infection.  In the setting of concomitant acute hypoxic respiratory failure, I have also ordered VBG to evaluate for any contribution from acute hypercapnic encephalopathy. I confirmed that the patient is due for his next scheduled hemodialysis in the morning (06/22/21).  In the setting of history of diabetes, CBG was checked and found to be 111. I asked to be notified if any further worsening of patient's mental status this evening.   Update: VBG demonstrates no evidence of contributory hypercapnia, with the following results: 7.3/49.      Babs Bertin, DO Hospitalist

## 2021-06-22 NOTE — Progress Notes (Signed)
Report called to receiving rn [id number]. Patient with no complaints at the current time.Will transfer via bed.

## 2021-06-22 NOTE — Progress Notes (Addendum)
PROGRESS NOTE    Nathaniel White  ZOX:[id number] DOB: 04-14-1960 DOA: 06/20/2021 PCP: Gildardo Pounds, NP  Chief Complaint  Patient presents with   Shortness of Breath   Vascular Access Problem    Brief Narrative:  61 yo with hx ESRD on HD, HFrEF, chronic restrictive lung disease, hx tobacco abuse, and multiple other medical issues who presented with increasing cough and shortness of breath, resulting in bleeding from his dialysis access.  He was admitted to vascular service for left arm brachiocephalic fistula ligation and L IJ 23 cm tunneled dialysis catheter placement.  TRH picked him up on 6/14 and he was noted to have persistent cough, shortness of breath, and new hypoxia.  CT showed findings concerning for necrotizing pneumonia.  Pulmonology consulted.  See below for additional details    Assessment & Plan:   Principal Problem:   Necrotizing pneumonia ([place]) Active Problems:   Acute respiratory failure with hypoxia ([place])   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 (HCC)   ESRD (end stage renal disease) (HCC)   Confusion   Restrictive lung disease   HFrEF (heart failure with reduced ejection fraction) (HCC)   Paroxysmal atrial fibrillation (HCC)   Type 2 diabetes mellitus (HCC)   CVA (cerebral vascular accident) ([place])   Chronic hypotension   Abnormal CT scan   Anemia   Thrombocytopenia (HCC)   CAD (coronary artery disease)   Aortic atherosclerosis (HCC)   Assessment and Plan: * Necrotizing pneumonia (HCC) Negative MRSA PCR, continue zosyn/doxycycline  Afebrile, elevated wbc in setting of steroids CT as above with findings concerning for necrotizing pneumonia Pulmonology c/s in setting of necrotizing pneumonia  Urine strep, legionella (he may not be able to give urine sample) Sputum cx pending MRSA PCR negative Solumedrol 40 mg daily Blood cultures pending Needs repeat imaging in about 6 weeks to ensure resolution of opacity   Acute  respiratory failure with hypoxia ([place]) Related to necrotizing pneumonia below +/- hx chronic restrictive lung disease Ct with patchy airspace consolidation in superior segment of LLL concerning for necrotizing pneumonia Volume per renal  abx as below Steroids per pulm Nebs per pulm  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 (HCC) S/p L arm brachiocephalic fistula ligation and L IJ 23 cm tunneled dialysis catheter placement 6/13 Plan for permanent access outpatient  Confusion Noted overnight, this is resolved at this time Hallucinations? Will continue to monitor  ESRD (end stage renal disease) ([place]) TTS dialysis Midodrine TID Volume/lytes per renal   Restrictive lung disease Needs pulm follow up outpatient advair at discharge  Paroxysmal atrial fibrillation ([place]) eliquis currently on hold - reevaluate with significant thrombocytopenia if worsening Downtrending, will hold eliquis for now  HFrEF (heart failure with reduced ejection fraction) (HCC) EF 35%, RVSF moderately reduced, severely elevated PASP Volume per renal    Type 2 diabetes mellitus (HCC) A1c 5.4 03/2021 and 10 years prior - will need to investigate history further  Chronic hypotension midodrine  CVA (cerebral vascular accident) ([place]) Hx CVA L sided weakness residual  Abnormal CT scan CT abd/pelvis for abdominal discomfort without revealing cause, but notable for unusual appearance of kidneys with innumerable lesions (see report) - possibility of aggressive renal neoplasms is not excluded -> further evaluation with non emergent abdominal MRI recommended (he can't get gadolinium with ESRD) Needs outpatient follow up  Thrombocytopenia ([place]) Will continue to monitor  Anemia downtrending with bleeding follow     DVT prophylaxis: SCD Code Status: full Family Communication: none Disposition:  Status is: Inpatient Remains inpatient appropriate because: need for IV abx,  pulm c/s   Consultants:  Vascular pulmonology  Procedures:  6/13 Left arm brachiocephalic fistula ligation Left IJ 23 cm tunneled dialysis catheter placement  Antimicrobials:  Anti-infectives (From admission, onward)    Start     Dose/Rate Route Frequency Ordered Stop   06/22/21 1800  ceFEPIme (MAXIPIME) 2 g in sodium chloride 0.9 % 100 mL IVPB  Status:  Discontinued        2 g 200 mL/hr over 30 Minutes Intravenous Every T-Th-Sa (Hemodialysis) 06/21/21 1209 06/21/21 1314   06/22/21 1200  vancomycin (VANCOREADY) IVPB 750 mg/150 mL  Status:  Discontinued        750 mg 150 mL/hr over 60 Minutes Intravenous Every T-Th-Sa (Hemodialysis) 06/21/21 1209 06/22/21 1314   06/21/21 1800  piperacillin-tazobactam (ZOSYN) IVPB 2.25 g        2.25 g 100 mL/hr over 30 Minutes Intravenous Every 8 hours 06/21/21 1314     06/21/21 1515  doxycycline (VIBRA-TABS) tablet 100 mg        100 mg Oral Every 12 hours 06/21/21 1415     06/21/21 1300  vancomycin (VANCOREADY) IVPB 1750 mg/350 mL        1,750 mg 175 mL/hr over 120 Minutes Intravenous STAT 06/21/21 1205 06/21/21 2202   06/21/21 1300  ceFEPIme (MAXIPIME) 2 g in sodium chloride 0.9 % 100 mL IVPB        2 g 200 mL/hr over 30 Minutes Intravenous STAT 06/21/21 1205 06/21/21 1338   06/20/21 0945  ceFAZolin (ANCEF) IVPB 2g/100 mL premix  Status:  Discontinued        2 g 200 mL/hr over 30 Minutes Intravenous Every 8 hours 06/20/21 0942 06/20/21 0952       Subjective: SOB and cough feeling better  Objective: Vitals:   06/22/21 1000 06/22/21 1100 06/22/21 1200 06/22/21 1405  BP: 109/66 114/71  108/67  Pulse: 68 62  65  Resp: '15 17  18  '$ Temp:   97.7 F (36.5 C) 97.8 F (36.6 C)  TempSrc:   Oral Oral  SpO2: 100% 98%  100%  Weight:      Height:        Intake/Output Summary (Last 24 hours) at 06/22/2021 1747 Last data filed at 06/22/2021 1230 Gross per 24 hour  Intake 410 ml  Output --  Net 410 ml   Filed Weights   06/20/21 0711  06/20/21 1523 06/20/21 1903  Weight: 75.8 kg 75.8 kg 75.3 kg    Examination:  General: No acute distress. Cardiovascular: RRR, tunneled dialysis cath on L chest Lungs: unlabored Abdomen: Soft, nontender, nondistended  Neurological: Alert and oriented 3. Moves all extremities 4 with equal strength. Cranial nerves II through XII grossly intact. Extremities: No clubbing or cyanosis. No edema.    Data Reviewed: I have personally reviewed following labs and imaging studies  CBC: Recent Labs  Lab 06/20/21 0722 06/20/21 0728 06/20/21 1024 06/20/21 2004 06/21/21 0415 06/21/21 0930 06/22/21 0501  WBC 10.4  --  9.0 7.3 7.1  --  13.0*  NEUTROABS  --   --   --   --   --   --  11.8*  HGB 11.0*   < > 9.0* 9.0* 8.2* 8.4* 8.7*  HCT 34.5*   < > 28.2* 26.9* 24.5* 26.0* 26.0*  MCV 97.2  --  94.9 91.5 93.2  --  95.9  PLT 75*  --  58* 56* 51*  --  40*   < > = values in this interval not displayed.    Basic Metabolic Panel: Recent Labs  Lab 06/20/21 0444 06/20/21 0728 06/20/21 0814 06/20/21 1024 06/20/21 2004 06/21/21 0415 06/21/21 0930 06/22/21 0501  NA 137 137 138 140 135 136  --  129*  K 5.3* 4.4 4.3 4.8 3.8 4.5  --  5.7*  CL 94* 100  --  104 97* 98  --  93*  CO2 23  --   --  20* 26 26  --  21*  GLUCOSE 108* 118*  --  130* 155* 144*  --  114*  BUN 90* 86*  --  87* 39* 45*  --  68*  CREATININE 11.50* 12.70*  --  11.29* 6.04* 6.86*  --  9.36*  CALCIUM 9.8  --   --  8.8* 8.4* 8.2*  --  8.2*  MG  --   --   --   --  1.7  --   --  2.1  PHOS  --   --   --   --   --   --  6.9* 8.1*    GFR: Estimated Creatinine Clearance: 8.6 mL/min (Alf Doyle) (by C-G formula based on SCr of 9.36 mg/dL (H)).  Liver Function Tests: Recent Labs  Lab 06/20/21 0444 06/20/21 1024 06/21/21 0930 06/22/21 0501  AST '18 15 20 27  '$ ALT '13 9 10 8  '$ ALKPHOS 60 39 38 35*  BILITOT 1.5* 1.7* 0.9 1.1  PROT 7.2 5.2* 5.8* 5.7*  ALBUMIN 3.4* 2.7* 2.8* 2.7*    CBG: Recent Labs  Lab 06/21/21 1607 06/21/21 2233  06/22/21 0640 06/22/21 1200 06/22/21 1629  GLUCAP 73 111* 92 85 89     Recent Results (from the past 240 hour(s))  Resp Panel by RT-PCR (Flu [place] Nies&B, Covid) Anterior Nasal Swab     Status: None   Collection Time: 06/20/21  6:13 AM   Specimen: Anterior Nasal Swab  Result Value Ref Range Status   SARS Coronavirus 2 by RT PCR NEGATIVE NEGATIVE Final    Comment: (NOTE) SARS-CoV-2 target nucleic acids are NOT DETECTED.  The SARS-CoV-2 RNA is generally detectable in upper respiratory specimens during the acute phase of infection. The lowest concentration of SARS-CoV-2 viral copies this assay can detect is 138 copies/mL. Kamryn Messineo negative result does not preclude SARS-Cov-2 infection and should not be used as the sole basis for treatment or other patient management decisions. Jarielys Girardot negative result may occur with  improper specimen collection/handling, submission of specimen other than nasopharyngeal swab, presence of viral mutation(s) within the areas targeted by this assay, and inadequate number of viral copies(<138 copies/mL). Kester Stimpson negative result must be combined with clinical observations, patient history, and epidemiological information. The expected result is Negative.  Fact Sheet for Patients:  EntrepreneurPulse.com.au  Fact Sheet for Healthcare Providers:  IncredibleEmployment.be  This test is no t yet approved or cleared by the [place] FDA and  has been authorized for detection and/or diagnosis of SARS-CoV-2 by FDA under an Emergency Use Authorization (EUA). This EUA will remain  in effect (meaning this test can be used) for the duration of the COVID-19 declaration under Section 564(b)(1) of the Act, 21 U.S.C.section 360bbb-3(b)(1), unless the authorization is terminated  or revoked sooner.       Influenza Ryen Rhames by PCR NEGATIVE NEGATIVE Final   Influenza B by PCR NEGATIVE NEGATIVE Final    Comment: (NOTE) The Xpert Xpress SARS-CoV-2/FLU/RSV plus  assay is intended as an aid in the  diagnosis of influenza from Nasopharyngeal swab specimens and should not be used as Vanisha Whiten sole basis for treatment. Nasal washings and aspirates are unacceptable for Xpert Xpress SARS-CoV-2/FLU/RSV testing.  Fact Sheet for Patients: EntrepreneurPulse.com.au  Fact Sheet for Healthcare Providers: IncredibleEmployment.be  This test is not yet approved or cleared by the [place] FDA and has been authorized for detection and/or diagnosis of SARS-CoV-2 by FDA under an Emergency Use Authorization (EUA). This EUA will remain in effect (meaning this test can be used) for the duration of the COVID-19 declaration under Section 564(b)(1) of the Act, 21 U.S.C. section 360bbb-3(b)(1), unless the authorization is terminated or revoked.  Performed at [facility], [place] [address] [place], [place] [address]   MRSA Next Gen by PCR, Nasal     Status: None   Collection Time: 06/20/21  8:39 PM   Specimen: Nasal Mucosa; Nasal Swab  Result Value Ref Range Status   MRSA by PCR Next Gen NOT DETECTED NOT DETECTED Final    Comment: (NOTE) The GeneXpert MRSA Assay (FDA approved for NASAL specimens only), is one component of Naviah Belfield comprehensive MRSA colonization surveillance program. It is not intended to diagnose MRSA infection nor to guide or monitor treatment for MRSA infections. Test performance is not FDA approved in patients less than 64 years old. Performed at [facility], [place] [address], [place], [place] [address]   Culture, blood (Routine X 2) w Reflex to ID Panel     Status: None (Preliminary result)   Collection Time: 06/21/21 12:51 PM   Specimen: BLOOD RIGHT ARM  Result Value Ref Range Status   Specimen Description BLOOD RIGHT ARM UPPER  Final   Special Requests   Final    BOTTLES DRAWN AEROBIC AND ANAEROBIC Blood Culture adequate volume   Culture   Final    NO GROWTH < 24 HOURS Performed at [facility], [place] [address], [place], [place] [address]    Report Status PENDING  Incomplete  Culture, blood (Routine X 2) w Reflex to ID Panel     Status: None (Preliminary result)   Collection Time: 06/21/21  1:03 PM   Specimen: BLOOD RIGHT ARM  Result Value Ref Range Status   Specimen Description BLOOD RIGHT ARM UPPER  Final   Special Requests   Final    BOTTLES DRAWN AEROBIC AND ANAEROBIC Blood Culture adequate volume   Culture   Final    NO GROWTH < 24 HOURS Performed at [facility], [place] [address], [place], [place] [address]    Report Status PENDING  Incomplete  Culture, Respiratory w Gram Stain     Status: None (Preliminary result)   Collection Time: 06/21/21  5:21 PM   Specimen: Tracheal Aspirate; Respiratory  Result Value Ref Range Status   Specimen Description TRACHEAL ASPIRATE  Final   Special Requests NONE  Final   Gram Stain   Final    ABUNDANT SQUAMOUS EPITHELIAL CELLS PRESENT ABUNDANT WBC PRESENT, PREDOMINANTLY PMN RARE GRAM POSITIVE COCCI RARE GRAM NEGATIVE RODS    Culture   Final    CULTURE REINCUBATED FOR BETTER GROWTH Performed at [facility], [place] [address], [place], [place] [address]    Report Status PENDING  Incomplete         Radiology Studies: CT CHEST ABDOMEN PELVIS WO CONTRAST  Result Date: 06/21/2021 CLINICAL DATA:  61 year old male with history of shortness of breath. EXAM: CT CHEST, ABDOMEN AND PELVIS WITHOUT CONTRAST TECHNIQUE: Multidetector  CT imaging of the chest, abdomen and pelvis was performed following the standard protocol without IV contrast. RADIATION DOSE REDUCTION: This exam was performed according to the departmental dose-optimization program which includes automated exposure control, adjustment of the mA and/or kV according to patient size and/or use of iterative reconstruction technique. COMPARISON:  High-resolution chest CT 10/08/2013. CT the abdomen and pelvis 02/13/2010. FINDINGS: CT CHEST FINDINGS Cardiovascular: Heart  size is moderately enlarged. There is no significant pericardial fluid, thickening or pericardial calcification. There is aortic atherosclerosis, as well as atherosclerosis of the great vessels of the mediastinum and the coronary arteries, including calcified atherosclerotic plaque in the left main, left anterior descending, left circumflex and right coronary arteries. Calcifications of the mitral annulus. Left internal jugular PermCath with tip terminating at the superior cavoatrial junction. Mediastinum/Nodes: No pathologically enlarged mediastinal or hilar lymph nodes. Please note that accurate exclusion of hilar adenopathy is limited on noncontrast CT scans. Esophagus is unremarkable in appearance. No axillary lymphadenopathy. Lungs/Pleura: Calcified pleural plaques are noted in the right hemithorax. No calcified pleural plaques are noted in the left hemithorax. Mild chronic generalized right-sided pleural thickening, similar to the prior study. Rounded opacity in the posterior aspect of the left lower lobe measuring approximately 6.3 x 4.2 cm (axial image 94 of series 4), centrally low-attenuation. Evaluation of the lungs is limited by considerable patient respiratory motion. With these limitations in mind, there are widespread areas of ground-glass attenuation and interlobular septal thickening, favored to reflect Elric Tirado background of interstitial pulmonary edema. Additional patchy peribronchovascular areas of airspace consolidation are noted in the left lower lobe, most severe in the superior segment. Musculoskeletal: Small amount of gas in the subcutaneous fat of the upper left chest wall related to recent dialysis catheter placement. There are no aggressive appearing lytic or blastic lesions noted in the visualized portions of the skeleton. CT ABDOMEN PELVIS FINDINGS Hepatobiliary: Liver has Jarom Govan nodular contour, indicative of underlying cirrhosis. No discrete cystic or solid hepatic lesions are confidently  identified on today's noncontrast CT examination. Numerous partially calcified gallstones are noted within the lumen of the gallbladder. Gallbladder is moderately distended. No definite pericholecystic fluid or surrounding inflammatory changes to indicate an acute cholecystitis at this time. Pancreas: No definite pancreatic mass or peripancreatic fluid collections or inflammatory changes are noted on today's noncontrast CT examination. Spleen: Unremarkable. Adrenals/Urinary Tract: There are innumerable lesions of varying degrees of complexity in both kidneys, many of which are extensively calcified, incompletely characterized on today's non-contrast CT examination, largest of which is in the anterior aspect of the interpolar region of the left kidney (axial image 64 of series 3) measuring 6.7 cm in diameter. This largest lesion and several other lesions have clearly enlarged when compared to remote prior examination from 2012. No hydroureteronephrosis. Urinary bladder is completely decompressed. Bilateral adrenal glands are normal in appearance. Stomach/Bowel: Unenhanced appearance of the stomach is normal. No pathologic dilatation of small bowel or colon. Emory Leaver few scattered colonic diverticulae are noted, without surrounding inflammatory changes to suggest an acute diverticulitis at this time. The appendix is not confidently identified and may be surgically absent. Regardless, there are no inflammatory changes noted adjacent to the cecum to suggest the presence of an acute appendicitis at this time. Vascular/Lymphatic: Aortic atherosclerosis. No definite lymphadenopathy confidently identified in the abdomen or pelvis on today's noncontrast CT examination. Reproductive: Prostate gland and seminal vesicles are unremarkable in appearance. Other: Densely calcified structure in the right iliac fossa, presumably Mccayla Shimada transplant kidney. No significant volume of  ascites. No pneumoperitoneum. Musculoskeletal: There are no  aggressive appearing lytic or blastic lesions noted in the visualized portions of the skeleton. IMPRESSION: 1. Rounded low-attenuation opacity in the left lower lobe, poorly evaluated on today's noncontrast CT examination. Given the adjacent patchy airspace consolidation in the superior segment of the left lower lobe, findings are concerning for potential necrotizing pneumonia. Mida Cory loculated pleural fluid collection is not excluded, but not strongly favored. 2. Very unusual appearance of the kidneys with innumerable lesions, many of which have clearly enlarged compared to remote prior study from 11 years ago and are complex in appearance including several which are partially calcified. The possibility of aggressive renal neoplasm(s) is not excluded, and further evaluation with nonemergent abdominal MRI with and without IV gadolinium is suggested in the near future to better evaluate these findings. 3. Cardiomegaly with evidence of interstitial pulmonary edema in the lungs; imaging findings concerning for congestive heart failure. 4. Cirrhosis. 5. Cholelithiasis without evidence of acute cholecystitis at this time. 6. Aortic atherosclerosis, in addition to left main and three-vessel coronary artery disease. Please note that although the presence of coronary artery calcium documents the presence of coronary artery disease, the severity of this disease and any potential stenosis cannot be assessed on this non-gated CT examination. Assessment for potential risk factor modification, dietary therapy or pharmacologic therapy may be warranted, if clinically indicated. 7. Additional incidental findings, as above. Electronically Signed   By: Vinnie Langton M.D.   On: 06/21/2021 10:15   DG CHEST PORT 1 VIEW  Result Date: 06/21/2021 CLINICAL DATA:  Shortness of breath, follow-up EXAM: PORTABLE CHEST 1 VIEW COMPARISON:  06/20/2021 FINDINGS: Interval placement of left IJ approach dialysis catheter. Tip overlies the central  SVC. Stable cardiomegaly. Possible pulmonary edema superimposed on chronic interstitial changes. No significant pleural effusion. No pneumothorax. IMPRESSION: No significant change. Possible pulmonary edema superimposed on chronic interstitial changes. Electronically Signed   By: Macy Mis M.D.   On: 06/21/2021 08:49        Scheduled Meds:  arformoterol  15 mcg Nebulization BID   Chlorhexidine Gluconate Cloth  6 each Topical Q0600   Chlorhexidine Gluconate Cloth  6 each Topical Q0600   docusate sodium  100 mg Oral BID   doxercalciferol  5 mcg Intravenous Q T,Th,Sa-HD   doxycycline  100 mg Oral Q12H   methylPREDNISolone (SOLU-MEDROL) injection  40 mg Intravenous Daily   midodrine  10 mg Oral TID WC   montelukast  10 mg Oral QHS   pantoprazole  40 mg Oral Daily   revefenacin  175 mcg Nebulization Daily   sucroferric oxyhydroxide  1,000 mg Oral BID PC   Continuous Infusions:  piperacillin-tazobactam (ZOSYN)  IV 2.25 g (06/22/21 1613)     LOS: 2 days    Time spent: over 30 min    Fayrene Helper, MD [facility]   To contact the attending provider between 7A-7P or the covering provider during after hours 7P-7A, please log into the web site [email or web address] and access using universal  password for that web site. If you do not have the password, please call the hospital operator.  06/22/2021, 5:47 PM

## 2021-06-23 ENCOUNTER — Other Ambulatory Visit (HOSPITAL_COMMUNITY): Payer: Self-pay

## 2021-06-23 DIAGNOSIS — K7460 Unspecified cirrhosis of liver: Secondary | ICD-10-CM

## 2021-06-23 DIAGNOSIS — J850 Gangrene and necrosis of lung: Secondary | ICD-10-CM | POA: Diagnosis not present

## 2021-06-23 LAB — IRON AND TIBC
Iron: 22 ug/dL — ABNORMAL LOW (ref 45–182)
Saturation Ratios: 12 % — ABNORMAL LOW (ref 17.9–39.5)
TIBC: 182 ug/dL — ABNORMAL LOW (ref 250–450)
UIBC: 160 ug/dL

## 2021-06-23 LAB — CBC WITH DIFFERENTIAL/PLATELET
Abs Immature Granulocytes: 0.08 10*3/uL — ABNORMAL HIGH (ref 0.00–0.07)
Basophils Absolute: 0 10*3/uL (ref 0.0–0.1)
Basophils Relative: 0 %
Eosinophils Absolute: 0 10*3/uL (ref 0.0–0.5)
Eosinophils Relative: 0 %
HCT: 25.2 % — ABNORMAL LOW (ref 39.0–52.0)
Hemoglobin: 8 g/dL — ABNORMAL LOW (ref 13.0–17.0)
Immature Granulocytes: 1 %
Lymphocytes Relative: 3 %
Lymphs Abs: 0.4 10*3/uL — ABNORMAL LOW (ref 0.7–4.0)
MCH: 30.2 pg (ref 26.0–34.0)
MCHC: 31.7 g/dL (ref 30.0–36.0)
MCV: 95.1 fL (ref 80.0–100.0)
Monocytes Absolute: 0.6 10*3/uL (ref 0.1–1.0)
Monocytes Relative: 5 %
Neutro Abs: 10.4 10*3/uL — ABNORMAL HIGH (ref 1.7–7.7)
Neutrophils Relative %: 91 %
Platelets: 47 10*3/uL — ABNORMAL LOW (ref 150–400)
RBC: 2.65 MIL/uL — ABNORMAL LOW (ref 4.22–5.81)
RDW: 17.1 % — ABNORMAL HIGH (ref 11.5–15.5)
WBC: 11.5 10*3/uL — ABNORMAL HIGH (ref 4.0–10.5)
nRBC: 0 % (ref 0.0–0.2)

## 2021-06-23 LAB — VITAMIN B12: Vitamin B-12: 343 pg/mL (ref 180–914)

## 2021-06-23 LAB — MAGNESIUM: Magnesium: 2.4 mg/dL (ref 1.7–2.4)

## 2021-06-23 LAB — COMPREHENSIVE METABOLIC PANEL
ALT: 6 U/L (ref 0–44)
AST: 21 U/L (ref 15–41)
Albumin: 2.6 g/dL — ABNORMAL LOW (ref 3.5–5.0)
Alkaline Phosphatase: 37 U/L — ABNORMAL LOW (ref 38–126)
Anion gap: 22 — ABNORMAL HIGH (ref 5–15)
BUN: 93 mg/dL — ABNORMAL HIGH (ref 6–20)
CO2: 21 mmol/L — ABNORMAL LOW (ref 22–32)
Calcium: 8.9 mg/dL (ref 8.9–10.3)
Chloride: 93 mmol/L — ABNORMAL LOW (ref 98–111)
Creatinine, Ser: 10.55 mg/dL — ABNORMAL HIGH (ref 0.61–1.24)
GFR, Estimated: 5 mL/min — ABNORMAL LOW (ref >60)
Glucose, Bld: 99 mg/dL (ref 70–99)
Potassium: 6.1 mmol/L — ABNORMAL HIGH (ref 3.5–5.1)
Sodium: 136 mmol/L (ref 135–145)
Total Bilirubin: 1.1 mg/dL (ref 0.3–1.2)
Total Protein: 6.1 g/dL — ABNORMAL LOW (ref 6.5–8.1)

## 2021-06-23 LAB — GLUCOSE, CAPILLARY
Glucose-Capillary: 75 mg/dL (ref 70–99)
Glucose-Capillary: 106 mg/dL — ABNORMAL HIGH (ref 70–99)
Glucose-Capillary: 68 mg/dL — ABNORMAL LOW (ref 70–99)
Glucose-Capillary: 46 mg/dL — ABNORMAL LOW (ref 70–99)
Glucose-Capillary: 141 mg/dL — ABNORMAL HIGH (ref 70–99)

## 2021-06-23 LAB — FOLATE: Folate: 15.4 ng/mL (ref >5.9)

## 2021-06-23 LAB — PHOSPHORUS: Phosphorus: 10 mg/dL — ABNORMAL HIGH (ref 2.5–4.6)

## 2021-06-23 LAB — FERRITIN: Ferritin: 506 ng/mL — ABNORMAL HIGH (ref 24–336)

## 2021-06-23 MED ORDER — ORAL CARE MOUTH RINSE: 15.0000 mL | OROMUCOSAL | Status: DC | PRN

## 2021-06-23 MED ORDER — DOXYCYCLINE HYCLATE 100 MG PO TABS
100.0000 mg | ORAL_TABLET | Freq: Two times a day (BID) | ORAL | 0 refills | Status: AC
  Filled 2021-06-23: qty 10, 5d supply, fill #0

## 2021-06-23 MED ORDER — PREDNISONE 10 MG PO TABS
40.0000 mg | ORAL_TABLET | Freq: Every day | ORAL | 0 refills | Status: AC
  Filled 2021-06-23: qty 12, 3d supply, fill #0

## 2021-06-23 MED ORDER — DEXTROSE 50 % IV SOLN
INTRAVENOUS | Status: AC
  Administered 2021-06-23: 100 mL
  Filled 2021-06-23: qty 50

## 2021-06-23 MED ORDER — HEPARIN SODIUM (PORCINE) 1000 UNIT/ML IJ SOLN
INTRAMUSCULAR | Status: AC
  Filled 2021-06-23: qty 4

## 2021-06-23 MED ORDER — APIXABAN 2.5 MG PO TABS: 2.5000 mg | ORAL_TABLET | Freq: Two times a day (BID) | ORAL | 5 refills | Status: DC

## 2021-06-23 MED ORDER — PANTOPRAZOLE SODIUM 40 MG PO TBEC
40.0000 mg | DELAYED_RELEASE_TABLET | Freq: Every day | ORAL | 1 refills | Status: DC
  Filled 2021-06-23: qty 30, 30d supply, fill #0

## 2021-06-23 MED ORDER — AMOXICILLIN-POT CLAVULANATE 500-125 MG PO TABS
500.0000 mg | ORAL_TABLET | Freq: Every day | ORAL | 0 refills | Status: AC
  Filled 2021-06-23: qty 5, 5d supply, fill #0

## 2021-06-23 NOTE — Assessment & Plan Note (Signed)
Follow with GI outpatient, this maybe cause of thrombocytopenia

## 2021-06-23 NOTE — Progress Notes (Signed)
Consulted to check Left chest HD cath. Noted site without dressing, expose to air. No redness an discharges noted. Dressing changed per protocol.

## 2021-06-23 NOTE — Discharge Summary (Addendum)
Physician Discharge Summary  Nathaniel White HFW:[id number] DOB: 1960/04/26 DOA: 06/20/2021  PCP: Gildardo Pounds, NP  Admit date: 06/20/2021 Discharge date: 06/23/2021  Time spent: 40 minutes  Recommendations for Outpatient Follow-up:  Follow outpatient CBC/CMP  Attention to platelets in follow up, hold eliquis until platelets >50 - continue follow up with cards to discuss appropriateness of continued anticoagulation, follow with heme to further evaluate thrombcytopenia (related to cirrhosis on imaging?) Follow with pulmonary outpatient for necrotizing pneumonia - needs repeat CT scan in 6 weeks Follow with vascular for ligated fistula, permanent access, staple removal  Follow with PCP and/or renal for abnormal kidney imaging  Follow final blood and resp cultures Follow with GI outpatient for cirrhosis on imaging  Holding discharge today due to mild confusion Will reevaluate in AM.   Discharge Diagnoses:  Principal Problem:   Necrotizing pneumonia ([place]) Active Problems:   Acute respiratory failure with hypoxia ([place])   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 (HCC)   ESRD (end stage renal disease) ([employer])   Confusion   Restrictive lung disease   HFrEF (heart failure with reduced ejection fraction) (HCC)   Paroxysmal atrial fibrillation (HCC)   Type 2 diabetes mellitus ([place])   CVA (cerebral vascular accident) ([place])   Chronic hypotension   Abnormal CT scan   Anemia   Thrombocytopenia ([place])   CAD (coronary artery disease)   Aortic atherosclerosis ([place])   Cirrhosis ([place])   Discharge Condition: stable  Diet recommendation: renal  Filed Weights   06/20/21 1523 06/20/21 1903 06/23/21 0450  Weight: 75.8 kg 75.3 kg 81.6 kg    History of present illness:  61 yo with hx ESRD on HD, HFrEF, chronic restrictive lung disease, hx tobacco abuse, and multiple other medical issues who presented with increasing cough and shortness of breath, resulting in  bleeding from his dialysis access.  He was admitted to vascular service for left arm brachiocephalic fistula ligation and L IJ 23 cm tunneled dialysis catheter placement.  TRH picked him up on 6/14 and he was noted to have persistent cough, shortness of breath, and new hypoxia.  CT showed findings concerning for necrotizing pneumonia.  Pulmonology consulted.  He's gradually improved with antibiotics and steroids.  Blood counts notable for anemia (relatively stable after initial downtrend) and thrombocytopenia.  Plan for discharge on abx and steroids with outpatient follow up with pulmonology, renal, PCP, vascular, and cardiology.  Eliquis on hold due to thrombocytopenia.   See below for additional details  See below for additional details  Hospital Course:  Assessment and Plan: * Necrotizing pneumonia ([place]) Negative MRSA PCR Respiratory culture with gram positive cocci, gram negative rods (awaiting final results, but called micro and they noted looked c/w normal resp flora) Afebrile, elevated wbc in setting of steroids CT as above with findings concerning for necrotizing pneumonia Pulmonology c/s in setting of necrotizing pneumonia  Urine strep, legionella (he may not be able to give urine sample) MRSA PCR negative Blood cultures NGx2 Will discharge with augmentin and doxycycline as well as Diquan Kassis few more days of steroids Needs repeat imaging in about 6 weeks to ensure resolution of opacity   Acute respiratory failure with hypoxia ([place]) Related to necrotizing pneumonia below +/- hx chronic restrictive lung disease Ct with patchy airspace consolidation in superior segment of LLL concerning for necrotizing pneumonia Volume per renal  abx as below Steroids per pulm Nebs per pulm Hypoxia resolved  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 (HCC) S/p L arm brachiocephalic  fistula ligation and L IJ 23 cm tunneled dialysis catheter placement 6/13 Plan for permanent  access outpatient Staples in place, per vascular, follow in 3-4 weeks  Confusion resolved  ESRD (end stage renal disease) ([place]) TTS dialysis Midodrine TID Volume/lytes per renal (K high today, had labs during HD)  Restrictive lung disease Needs pulm follow up outpatient advair at discharge  Paroxysmal atrial fibrillation ([place]) eliquis currently on hold - given severe thrombocytopenia, will continue to hold until platelets >50,000.  Needs follow up with cardiology and hematology outpatient.  Will plan to resume once >50,000. Renal to follow platelets weekly. Downtrending, will hold eliquis for now  HFrEF (heart failure with reduced ejection fraction) (HCC) EF 35%, RVSF moderately reduced, severely elevated PASP Volume per renal    Type 2 diabetes mellitus (HCC) A1c 5.4 03/2021 and 10 years prior  Unclear history, would follow outpatient  Chronic hypotension midodrine  CVA (cerebral vascular accident) ([place]) Hx CVA L sided weakness residual  Abnormal CT scan CT abd/pelvis for abdominal discomfort without revealing cause, but notable for unusual appearance of kidneys with innumerable lesions (see report) - possibility of aggressive renal neoplasms is not excluded -> further evaluation with non emergent abdominal MRI recommended (he can't get gadolinium with ESRD) Needs outpatient follow up  Thrombocytopenia ([place]) Hold eliquis until platelets >50,000 Can resume cautiously at that point Hematology referral, needs to follow with cardiology as well  Anemia Relatively stable around 8 over past few days downtrended with initial bleeding Follow outpatient  Cirrhosis ([place]) Follow with GI outpatient, this maybe cause of thrombocytopenia      Procedures: 6/13 Left arm brachiocephalic fistula ligation Left IJ 23 cm tunneled dialysis catheter placement   Consultations: Vascular Renal pulmonary  Discharge Exam: Vitals:   06/23/21 1034 06/23/21 1130  BP:  (!) 104/57   Pulse:  86  Resp:  18  Temp:  97.8 F (36.6 C)  SpO2: 99% 94%   Feels better No new complaints  General: No acute distress. Cardiovascular: RRR Lungs: unlabored, no o2 needs Abdomen: Soft, nontender, nondistended  Neurological: Alert and oriented 3. Moves all extremities 4 with equal strength. Cranial nerves II through XII grossly intact. Extremities: staples to left arm  Discharge Instructions   Discharge Instructions     Ambulatory referral to Gastroenterology   Complete by: As directed    What is the reason for referral?: Other Comment - cirrhosis   Ambulatory referral to Hematology / Oncology   Complete by: As directed    Ambulatory referral to Pulmonology   Complete by: As directed    Reason for referral: Other Comment - pneumonia   Call MD for:  difficulty breathing, headache or visual disturbances   Complete by: As directed    Call MD for:  extreme fatigue   Complete by: As directed    Call MD for:  hives   Complete by: As directed    Call MD for:  persistant dizziness or light-headedness   Complete by: As directed    Call MD for:  persistant nausea and vomiting   Complete by: As directed    Call MD for:  redness, tenderness, or signs of infection (pain, swelling, redness, odor or green/yellow discharge around incision site)   Complete by: As directed    Call MD for:  severe uncontrolled pain   Complete by: As directed    Call MD for:  temperature >100.4   Complete by: As directed    Diet - low sodium heart healthy  Complete by: As directed    Discharge instructions   Complete by: As directed    You were seen for Ivy Puryear bleeding fistula.  This was treated by vascular surgery.   You now have Ashlynne Shetterly tunneled dialysis catheter.  You should follow with vascular in 3-4 weeks.  You had cough and shortness of breath related to necrotizing pneumonia.  We'll treat you with steroids and antibiotics.  You should follow up with the lung doctors as an outpatient.  You'll  need Hayli Milligan repeat CT scan in about 6 weeks.  Your primary care doctor should follow up on your cultures as an outpatient.  Hold your eliquis until your platelets are above 50 (50,000).  I'll ask them to check this at dialysis for you and they'll instruct you on when to restart your eliquis.  You should follow up with cardiology outpatient.  You should also follow up with hematology (blood doctors).  You had abnormal imaging of your kidneys that needs to be followed with an MRI outpatient.  Please follow this up with your PCP and/or your kidney doctor.  We'll send you to see gastroenterology outpatient for imaging findings concerning for cirrhosis.  Please follow up with your PCP within Dela Sweeny week.   Return for new, recurrent, or worsening symptoms.  Please ask your PCP to request records from this hospitalization so they know what was done and what the next steps will be.   Discharge wound care:   Complete by: As directed    Per vascular surgery   Increase activity slowly   Complete by: As directed       Allergies as of 06/23/2021       Reactions   Codeine Shortness Of Breath   Dextromethorphan-guaifenesin Shortness Of Breath   Iron Dextran Shortness Of Breath   Morphine And Related Shortness Of Breath        Medication List     TAKE these medications    acetaminophen 500 MG tablet Commonly known as: TYLENOL Take 1 tablet (500 mg total) by mouth every 6 (six) hours as needed. What changed: reasons to take this   Advair Diskus 250-50 MCG/ACT Aepb Generic drug: fluticasone-salmeterol Inhale 1 puff into the lungs in the morning and at bedtime.   albuterol 108 (90 Base) MCG/ACT inhaler Commonly known as: ProAir HFA Inhale 2 puffs into the lungs every 6 (six) hours as needed for wheezing or shortness of breath.   amoxicillin-clavulanate 500-125 MG tablet Commonly known as: Augmentin Take 1 tablet (500 mg total) by mouth daily for 5 days. (In the afternoon)   apixaban 2.5 MG  Tabs tablet Commonly known as: ELIQUIS Take 1 tablet (2.5 mg total) by mouth 2 (two) times daily. Hold your eliquis until your platelets are over 50.  You should follow up with cardiology and hematology about your blood thinners. What changed: additional instructions   Dialyvite 800 0.8 MG Tabs Take 1 tablet by mouth daily.   docusate sodium 100 MG capsule Commonly known as: COLACE Take 1 capsule (100 mg total) by mouth 2 (two) times daily. What changed:  when to take this reasons to take this   doxycycline 100 MG tablet Commonly known as: VIBRA-TABS Take 1 tablet (100 mg total) by mouth every 12 (twelve) hours for 5 days.   glucose blood test strip   hydrOXYzine 10 MG tablet Commonly known as: ATARAX Take 1 tablet (10 mg total) by mouth 3 (three) times daily as needed. For anxiety What changed:  reasons to  take this additional instructions   midodrine 10 MG tablet Commonly known as: PROAMATINE Place 1 tablet (10 mg total) into feeding tube 3 (three) times daily with meals. What changed: how to take this   montelukast 10 MG tablet Commonly known as: SINGULAIR Take 1 tablet (10 mg total) by mouth at bedtime.   pantoprazole 40 MG tablet Commonly known as: Protonix Take 1 tablet (40 mg total) by mouth daily.   predniSONE 10 MG tablet Commonly known as: DELTASONE Take 4 tablets (40 mg total) by mouth daily for 3 days.   Velphoro 500 MG chewable tablet Generic drug: sucroferric oxyhydroxide Chew 1,000 mg by mouth 2 (two) times daily after Mariesa Grieder meal.   Visine Advanced Relief 0.05-0.1-1-1 % Soln Generic drug: Tetrahydroz-Dextran-PEG-Povid Place 1 drop into both eyes 2 (two) times daily as needed (for dryness).               Discharge Care Instructions  (From admission, onward)           Start     Ordered   06/23/21 0000  Discharge wound care:       Comments: Per vascular surgery   06/23/21 1516           Allergies  Allergen Reactions   Codeine  Shortness Of Breath   Dextromethorphan-Guaifenesin Shortness Of Breath   Iron Dextran Shortness Of Breath   Morphine And Related Shortness Of Breath    Follow-up Information     [facility] Follow up.   Why: 3-4 weeks. The office will call the patient with an appointment Contact information: [facility] [place] Follow up.   Why: ([facility])-[facility] arranged- they will contact you to schedule [phone])                 The results of significant diagnostics from this hospitalization (including imaging, microbiology, ancillary and laboratory) are listed below for reference.    Significant Diagnostic Studies: CT CHEST ABDOMEN PELVIS WO CONTRAST  Result Date: 06/21/2021 CLINICAL DATA:  60 year old male with history of shortness of breath. EXAM: CT CHEST, ABDOMEN AND PELVIS WITHOUT CONTRAST TECHNIQUE: Multidetector CT imaging of the chest, abdomen and pelvis was performed following the standard protocol without IV contrast. RADIATION DOSE REDUCTION: This exam was performed according to the departmental dose-optimization program which includes automated exposure control, adjustment of the mA and/or kV according to patient size and/or use of iterative reconstruction technique. COMPARISON:  High-resolution chest CT 10/08/2013. CT the abdomen and pelvis 02/13/2010. FINDINGS: CT CHEST FINDINGS Cardiovascular: Heart size is moderately enlarged. There is no significant pericardial fluid, thickening or pericardial calcification. There is aortic atherosclerosis, as well as atherosclerosis of the great vessels of the mediastinum and the coronary arteries, including calcified atherosclerotic plaque in the left main, left anterior descending, left circumflex and right coronary arteries. Calcifications of the mitral annulus. Left internal jugular PermCath with tip terminating at the superior cavoatrial  junction. Mediastinum/Nodes: No pathologically enlarged mediastinal or hilar lymph nodes. Please note that accurate exclusion of hilar adenopathy is limited on noncontrast CT scans. Esophagus is unremarkable in appearance. No axillary lymphadenopathy. Lungs/Pleura: Calcified pleural plaques are noted in the right hemithorax. No calcified pleural plaques are noted in the left hemithorax. Mild chronic generalized right-sided pleural thickening, similar to the prior study. Rounded opacity in the posterior aspect of the left lower lobe measuring approximately 6.3 x 4.2 cm (axial image 94 of  series 4), centrally low-attenuation. Evaluation of the lungs is limited by considerable patient respiratory motion. With these limitations in mind, there are widespread areas of ground-glass attenuation and interlobular septal thickening, favored to reflect Elton Catalano background of interstitial pulmonary edema. Additional patchy peribronchovascular areas of airspace consolidation are noted in the left lower lobe, most severe in the superior segment. Musculoskeletal: Small amount of gas in the subcutaneous fat of the upper left chest wall related to recent dialysis catheter placement. There are no aggressive appearing lytic or blastic lesions noted in the visualized portions of the skeleton. CT ABDOMEN PELVIS FINDINGS Hepatobiliary: Liver has Debbi Strandberg nodular contour, indicative of underlying cirrhosis. No discrete cystic or solid hepatic lesions are confidently identified on today's noncontrast CT examination. Numerous partially calcified gallstones are noted within the lumen of the gallbladder. Gallbladder is moderately distended. No definite pericholecystic fluid or surrounding inflammatory changes to indicate an acute cholecystitis at this time. Pancreas: No definite pancreatic mass or peripancreatic fluid collections or inflammatory changes are noted on today's noncontrast CT examination. Spleen: Unremarkable. Adrenals/Urinary Tract: There are  innumerable lesions of varying degrees of complexity in both kidneys, many of which are extensively calcified, incompletely characterized on today's non-contrast CT examination, largest of which is in the anterior aspect of the interpolar region of the left kidney (axial image 64 of series 3) measuring 6.7 cm in diameter. This largest lesion and several other lesions have clearly enlarged when compared to remote prior examination from 2012. No hydroureteronephrosis. Urinary bladder is completely decompressed. Bilateral adrenal glands are normal in appearance. Stomach/Bowel: Unenhanced appearance of the stomach is normal. No pathologic dilatation of small bowel or colon. Julieta Rogalski few scattered colonic diverticulae are noted, without surrounding inflammatory changes to suggest an acute diverticulitis at this time. The appendix is not confidently identified and may be surgically absent. Regardless, there are no inflammatory changes noted adjacent to the cecum to suggest the presence of an acute appendicitis at this time. Vascular/Lymphatic: Aortic atherosclerosis. No definite lymphadenopathy confidently identified in the abdomen or pelvis on today's noncontrast CT examination. Reproductive: Prostate gland and seminal vesicles are unremarkable in appearance. Other: Densely calcified structure in the right iliac fossa, presumably Annabella Elford transplant kidney. No significant volume of ascites. No pneumoperitoneum. Musculoskeletal: There are no aggressive appearing lytic or blastic lesions noted in the visualized portions of the skeleton. IMPRESSION: 1. Rounded low-attenuation opacity in the left lower lobe, poorly evaluated on today's noncontrast CT examination. Given the adjacent patchy airspace consolidation in the superior segment of the left lower lobe, findings are concerning for potential necrotizing pneumonia. Mikaiah Stoffer loculated pleural fluid collection is not excluded, but not strongly favored. 2. Very unusual appearance of the kidneys  with innumerable lesions, many of which have clearly enlarged compared to remote prior study from 11 years ago and are complex in appearance including several which are partially calcified. The possibility of aggressive renal neoplasm(s) is not excluded, and further evaluation with nonemergent abdominal MRI with and without IV gadolinium is suggested in the near future to better evaluate these findings. 3. Cardiomegaly with evidence of interstitial pulmonary edema in the lungs; imaging findings concerning for congestive heart failure. 4. Cirrhosis. 5. Cholelithiasis without evidence of acute cholecystitis at this time. 6. Aortic atherosclerosis, in addition to left main and three-vessel coronary artery disease. Please note that although the presence of coronary artery calcium documents the presence of coronary artery disease, the severity of this disease and any potential stenosis cannot be assessed on this non-gated CT examination. Assessment for  potential risk factor modification, dietary therapy or pharmacologic therapy may be warranted, if clinically indicated. 7. Additional incidental findings, as above. Electronically Signed   By: Vinnie Langton M.D.   On: 06/21/2021 10:15   DG CHEST PORT 1 VIEW  Result Date: 06/21/2021 CLINICAL DATA:  Shortness of breath, follow-up EXAM: PORTABLE CHEST 1 VIEW COMPARISON:  06/20/2021 FINDINGS: Interval placement of left IJ approach dialysis catheter. Tip overlies the central SVC. Stable cardiomegaly. Possible pulmonary edema superimposed on chronic interstitial changes. No significant pleural effusion. No pneumothorax. IMPRESSION: No significant change. Possible pulmonary edema superimposed on chronic interstitial changes. Electronically Signed   By: Macy Mis M.D.   On: 06/21/2021 08:49   DG C-Arm 1-60 Min  Result Date: 06/20/2021 CLINICAL DATA:  Insertion of dialysis catheter EXAM: DG C-ARM 1-60 MIN COMPARISON:  Chest radiograph from earlier today. FLUOROSCOPY  TIME:  Radiation Exposure Index (if provided by the fluoroscopic device): 11.8 mGy FINDINGS: Spot fluoroscopic intraoperative coned chest radiograph demonstrates left internal jugular central venous catheter terminating over the right atrium. Endotracheal tube tip is 2.4 cm above the carina. IMPRESSION: Intraoperative fluoroscopic guidance for left internal jugular central venous catheter placement. Electronically Signed   By: Ilona Sorrel M.D.   On: 06/20/2021 09:12   DG Chest Port 1 View  Result Date: 06/20/2021 CLINICAL DATA:  61 year old male with history of shortness of breath and productive cough. EXAM: PORTABLE CHEST 1 VIEW COMPARISON:  Chest x-ray 06/07/2021. FINDINGS: Lung volumes are slightly low. Widespread patchy areas of interstitial prominence, peribronchial cuffing, patchy ill-defined opacities and areas of architectural distortion are again noted throughout the lungs bilaterally, most evident in the mid to lower lungs (right-greater-than-left) with areas of chronic pleuroparenchymal scarring and architectural distortion in the right mid to lower lung, similar to prior examinations. No definite acute consolidative airspace disease. No definite pleural effusions. No pneumothorax. Pulmonary vasculature is largely obscured. Heart size is mildly enlarged. Mediastinal contours are distorted by patient's positioning. Atherosclerotic calcifications in the thoracic aorta. IMPRESSION: 1. Grossly abnormal chest x-ray, which is largely similar to prior examinations. Overall, imaging findings are favored to reflect interstitial lung disease and areas of chronic post infectious or inflammatory scarring (particularly in the right lung base), given the similarity to prior studies. The possibility of acute disease (edema or infection) is not excluded, but not strongly favored. These findings could be much better evaluated with follow-up high-resolution chest CT in the near future. 2. Cardiomegaly. 3. Aortic  atherosclerosis. Electronically Signed   By: Vinnie Langton M.D.   On: 06/20/2021 05:30   DG Chest 2 View  Result Date: 06/07/2021 CLINICAL DATA:  productive cough/pna. History of recurrent pneumonia EXAM: CHEST - 2 VIEW COMPARISON:  Chest x-ray 03/11/2021 FINDINGS: Cardiomegaly. The heart and mediastinal contours are unchanged. Aortic calcification. Improved aeration of the left lower lobe. No new focal consolidation. Linear atelectasis versus scarring of the right lower lobe again noted. Chronic coarsened interstitial markings with no overt pulmonary edema. Persistent blunting of the right costophrenic angle. No pleural effusion. No pneumothorax. No acute osseous abnormality. IMPRESSION: 1. No definite new focal consolidation. 2. No active cardiopulmonary disease in Yahya Boldman patient with underlying lung scarring. 3. Chronic blunting of the right costophrenic angle with underlying trace pleural effusion not excluded. 4.  Aortic Atherosclerosis (ICD10-I70.0). Electronically Signed   By: Iven Finn M.D.   On: 06/07/2021 16:58    Microbiology: Recent Results (from the past 240 hour(s))  Resp Panel by RT-PCR (Flu Elantra Caprara&B, Covid) Anterior Nasal Swab  Status: None   Collection Time: 06/20/21  6:13 AM   Specimen: Anterior Nasal Swab  Result Value Ref Range Status   SARS Coronavirus 2 by RT PCR NEGATIVE NEGATIVE Final    Comment: (NOTE) SARS-CoV-2 target nucleic acids are NOT DETECTED.  The SARS-CoV-2 RNA is generally detectable in upper respiratory specimens during the acute phase of infection. The lowest concentration of SARS-CoV-2 viral copies this assay can detect is 138 copies/mL. Aaronjames Kelsay negative result does not preclude SARS-Cov-2 infection and should not be used as the sole basis for treatment or other patient management decisions. Oakes Mccready negative result may occur with  improper specimen collection/handling, submission of specimen other than nasopharyngeal swab, presence of viral mutation(s) within  the areas targeted by this assay, and inadequate number of viral copies(<138 copies/mL). Danner Paulding negative result must be combined with clinical observations, patient history, and epidemiological information. The expected result is Negative.  Fact Sheet for Patients:  EntrepreneurPulse.com.au  Fact Sheet for Healthcare Providers:  IncredibleEmployment.be  This test is no t yet approved or cleared by the [place] FDA and  has been authorized for detection and/or diagnosis of SARS-CoV-2 by FDA under an Emergency Use Authorization (EUA). This EUA will remain  in effect (meaning this test can be used) for the duration of the COVID-19 declaration under Section 564(b)(1) of the Act, 21 U.S.C.section 360bbb-3(b)(1), unless the authorization is terminated  or revoked sooner.       Influenza Nazir[place] Tri by PCR NEGATIVE NEGATIVE Final   Influenza B by PCR NEGATIVE NEGATIVE Final    Comment: (NOTE) The Xpert Xpress SARS-CoV-2/FLU/RSV plus assay is intended as an aid in the diagnosis of influenza from Nasopharyngeal swab specimens and should not be used as Kaliel Bolds sole basis for treatment. Nasal washings and aspirates are unacceptable for Xpert Xpress SARS-CoV-2/FLU/RSV testing.  Fact Sheet for Patients: EntrepreneurPulse.com.au  Fact Sheet for Healthcare Providers: IncredibleEmployment.be  This test is not yet approved or cleared by the [place] FDA and has been authorized for detection and/or diagnosis of SARS-CoV-2 by FDA under an Emergency Use Authorization (EUA). This EUA will remain in effect (meaning this test can be used) for the duration of the COVID-19 declaration under Section 564(b)(1) of the Act, 21 U.S.C. section 360bbb-3(b)(1), unless the authorization is terminated or revoked.  Performed at [facility], [place] [address], [place], [place] [address]   MRSA Next Gen by PCR, Nasal     Status: None    Collection Time: 06/20/21  8:39 PM   Specimen: Nasal Mucosa; Nasal Swab  Result Value Ref Range Status   MRSA by PCR Next Gen NOT DETECTED NOT DETECTED Final    Comment: (NOTE) The GeneXpert MRSA Assay (FDA approved for NASAL specimens only), is one component of Gazella Anglin comprehensive MRSA colonization surveillance program. It is not intended to diagnose MRSA infection nor to guide or monitor treatment for MRSA infections. Test performance is not FDA approved in patients less than 54 years old. Performed at [facility], [place] [address] [place], [place] [address]   Culture, blood (Routine X 2) w Reflex to ID Panel     Status: None (Preliminary result)   Collection Time: 06/21/21 12:51 PM   Specimen: BLOOD RIGHT ARM  Result Value Ref Range Status   Specimen Description BLOOD RIGHT ARM UPPER  Final   Special Requests   Final    BOTTLES DRAWN AEROBIC AND ANAEROBIC Blood Culture adequate volume   Culture   Final    NO  GROWTH 2 DAYS Performed at [facility], [place] [address] [place], [place] [address]    Report Status PENDING  Incomplete  Culture, blood (Routine X 2) w Reflex to ID Panel     Status: None (Preliminary result)   Collection Time: 06/21/21  1:03 PM   Specimen: BLOOD RIGHT ARM  Result Value Ref Range Status   Specimen Description BLOOD RIGHT ARM UPPER  Final   Special Requests   Final    BOTTLES DRAWN AEROBIC AND ANAEROBIC Blood Culture adequate volume   Culture   Final    NO GROWTH 2 DAYS Performed at [facility], [place] [address], [place], [place] [address]    Report Status PENDING  Incomplete  Culture, Respiratory w Gram Stain     Status: None (Preliminary result)   Collection Time: 06/21/21  5:21 PM   Specimen: Tracheal Aspirate; Respiratory  Result Value Ref Range Status   Specimen Description TRACHEAL ASPIRATE  Final   Special Requests NONE  Final   Gram Stain   Final    ABUNDANT SQUAMOUS EPITHELIAL CELLS PRESENT ABUNDANT WBC PRESENT, PREDOMINANTLY  PMN RARE GRAM POSITIVE COCCI RARE GRAM NEGATIVE RODS    Culture   Final    CULTURE REINCUBATED FOR BETTER GROWTH Performed at [facility], [place] [address], [place],  [address]    Report Status PENDING  Incomplete     Labs: Basic Metabolic Panel: Recent Labs  Lab 06/20/21 1024 06/20/21 2004 06/21/21 0415 06/21/21 0930 06/22/21 0501 06/23/21 0236  NA 140 135 136  --  129* 136  K 4.8 3.8 4.5  --  5.7* 6.1*  CL 104 97* 98  --  93* 93*  CO2 20* 26 26  --  21* 21*  GLUCOSE 130* 155* 144*  --  114* 99  BUN 87* 39* 45*  --  68* 93*  CREATININE 11.29* 6.04* 6.86*  --  9.36* 10.55*  CALCIUM 8.8* 8.4* 8.2*  --  8.2* 8.9  MG  --  1.7  --   --  2.1 2.4  PHOS  --   --   --  6.9* 8.1* 10.0*   Liver Function Tests: Recent Labs  Lab 06/20/21 0444 06/20/21 1024 06/21/21 0930 06/22/21 0501 06/23/21 0236  AST '18 15 20 27 21  '$ ALT '13 9 10 8 6  '$ ALKPHOS 60 39 38 35* 37*  BILITOT 1.5* 1.7* 0.9 1.1 1.1  PROT 7.2 5.2* 5.8* 5.7* 6.1*  ALBUMIN 3.4* 2.7* 2.8* 2.7* 2.6*   Recent Labs  Lab 06/21/21 0930  LIPASE 43   No results for input(s): "AMMONIA" in the last 168 hours. CBC: Recent Labs  Lab 06/20/21 1024 06/20/21 2004 06/21/21 0415 06/21/21 0930 06/22/21 0501 06/23/21 0236  WBC 9.0 7.3 7.1  --  13.0* 11.5*  NEUTROABS  --   --   --   --  11.8* 10.4*  HGB 9.0* 9.0* 8.2* 8.4* 8.7* 8.0*  HCT 28.2* 26.9* 24.5* 26.0* 26.0* 25.2*  MCV 94.9 91.5 93.2  --  95.9 95.1  PLT 58* 56* 51*  --  40* 47*   Cardiac Enzymes: No results for input(s): "CKTOTAL", "CKMB", "CKMBINDEX", "TROPONINI" in the last 168 hours. BNP: BNP (last 3 results) Recent Labs    06/20/21 0444  BNP 2,645.2*    ProBNP (last 3 results) No results for input(s): "PROBNP" in the last 8760 hours.  CBG: Recent Labs  Lab 06/22/21 1200 06/22/21 1629 06/22/21 2046 06/23/21 0620 06/23/21 1128  GLUCAP 85 89 148* 75 106*       Signed:  Fayrene Helper MD.  [facility] 06/23/2021,  4:01 PM

## 2021-06-23 NOTE — Progress Notes (Signed)
The morning labs were drawn at 2:36 am during hemodialysis.  Kennyth Lose, RN

## 2021-06-23 NOTE — Evaluation (Signed)
Physical Therapy Evaluation Patient Details Name: Nathaniel White MRN: [id number] DOB: [birth year] Today's Date: 06/23/2021  History of Present Illness  Pt is a 61 y.o. male admitted 06/20/21 with LUE AVF bleeding after coughing spell. S/p LUE AVF ligation, LIJ tunneled HD cath placement 6/13. PMH includes stroke (residual L side weakness), ESRD (HD TTS), HTN, PVD, COPD, CHF, asthma, anxiety.   Clinical Impression  Pt presents with an overall decrease in functional mobility secondary to above. PTA, pt reports ambulatory with and without DME (pt "furniture surfs"), lives with "good friends" who assist with ADL/iADLs as needed. Today, pt requiring minA for standing and pivotal steps, reaching to furniture for RUE stability; pt c/o dizziness with standing. Discussed safe d/c home, pt endorses having necessary assist and equipment. Will plan for gait trial with quad cane next session for added stability. Pt would benefit from continued acute PT services to maximize functional mobility and independence prior to d/c with HHPT services.     Post-standing BP 101/53, HR 80 SpO2 94% on RA    Recommendations for follow up therapy are one component of a multi-disciplinary discharge planning process, led by the attending physician.  Recommendations may be updated based on patient status, additional functional criteria and insurance authorization.  Follow Up Recommendations Home health PT    Assistance Recommended at Discharge Intermittent Supervision/Assistance  Patient can return home with the following  A little help with walking and/or transfers;A little help with bathing/dressing/bathroom;Assistance with cooking/housework;Assist for transportation;Help with stairs or ramp for entrance    Equipment Recommendations  (TBD - quad cane)  Recommendations for Other Services   Occupational THerapy   Functional Status Assessment Patient has had a recent decline in their functional status and demonstrates the  ability to make significant improvements in function in a reasonable and predictable amount of time.     Precautions / Restrictions Precautions Precautions: Fall;Other (comment) Precaution Comments: h/o CVA with L-side residual weakness      Mobility  Bed Mobility Overal bed mobility: Needs Assistance Bed Mobility: Rolling, Sidelying to Sit Rolling: Modified independent (Device/Increase time) Sidelying to sit: Min assist, HOB elevated       General bed mobility comments: use of bed rail to roll onto L-side, minA for trunk elevation to sit EOB    Transfers Overall transfer level: Needs assistance Equipment used: 1 person hand held assist Transfers: Sit to/from Stand, Bed to chair/wheelchair/BSC Sit to Stand: Min assist, Min guard   Step pivot transfers: Min assist       General transfer comment: initial minA for HHA to elevate trunk and stabilize with standing from EOB and step pivot to recliner, pt with preference to reach RUE to furniture for stability; additional sit<>stand from recliner with min guard, again reaching to furniture for stability (reports "this is how I do it at home"); endorses dizziness upon standing requiring seated rest    Ambulation/Gait                  Stairs            Wheelchair Mobility    Modified Rankin (Stroke Patients Only)       Balance Overall balance assessment: Needs assistance Sitting-balance support: No upper extremity supported, Feet supported Sitting balance-Leahy Scale: Fair       Standing balance-Leahy Scale: Fair Standing balance comment: can static stand without UE support, but unable to accept challenge; preference for RUE support to stabilize  Pertinent Vitals/Pain Pain Assessment Pain Assessment: Faces Faces Pain Scale: Hurts a little bit Pain Location: LUE Pain Descriptors / Indicators: Operative site guarding Pain Intervention(s): Monitored during session     Home Living Family/patient expects to be discharged to:: Private residence Living Arrangements: Non-relatives/Friends Available Help at Discharge: Friend(s);Available 24 hours/day Type of Home: House Home Access: Stairs to enter   [employer] of Steps: 2   Home Layout: Two level;Able to live on main level with bedroom/bathroom Home Equipment: BSC/3in1;Rollator (4 wheels);Shower seat;Cane - single point;Wheelchair - manual Additional Comments: lives with two girls who are "good friends" and assist with as much as needed at home    Prior Function Prior Level of Function : Needs assist             Mobility Comments: typically furniture surfs around home for ambulation, reports use of rollator for "longer distances"; drives sometimes ADLs Comments: inconsistent details - pt reports able to bathe himself, also reports girls help him bathe, he does bird baths and gets in shower; reports indep toileting; girls do all household tasks (cooking, cleaning)     Hand Dominance        Extremity/Trunk Assessment   Upper Extremity Assessment Upper Extremity Assessment: Generalized weakness;LUE deficits/detail LUE Deficits / Details: s/p LUE AVF ligation; h/o CVA with residual LUE weakness and flexor contractures; noted palm clean/dry, pt reports cleaning daily; pt reports AROM/AAROM "sometimes", limited elbow extension and shoulder ROM LUE Coordination: decreased gross motor;decreased fine motor    Lower Extremity Assessment Lower Extremity Assessment: Generalized weakness;LLE deficits/detail LLE Deficits / Details: h/o CVA with residual LLE weakness; hip and knee observed functional strength at least 3/5, decreased ankle DF LLE Coordination: decreased gross motor;decreased fine motor       Communication   Communication: Expressive difficulties  Cognition Arousal/Alertness: Awake/alert Behavior During Therapy: WFL for tasks assessed/performed Overall Cognitive Status:  No family/caregiver present to determine baseline cognitive functioning                                 General Comments: pt inconsistent historian regarding PLOF; repeated cues for following commands; suspect near baseline cognition        General Comments General comments (skin integrity, edema, etc.): received pt getting dependent washup at bed-level with NT; encouraged pt to do this OOB next time since he is ambulatory with assist. pt c/o weakness standing, BP 101/53, HR 80. discussed safe d/c home, pt reports having necessary DME and assist    Exercises Other Exercises Other Exercises: LUE AROM/AAROM elbow flex/ext, wrist flex/ext, finger flex/ext   Assessment/Plan    PT Assessment Patient needs continued PT services  PT Problem List Decreased strength;Decreased range of motion;Decreased activity tolerance;Decreased balance;Decreased mobility;Decreased cognition;Decreased knowledge of use of DME;Decreased safety awareness       PT Treatment Interventions DME instruction;Gait training;Functional mobility training;Stair training;Therapeutic activities;Therapeutic exercise;Balance training;Patient/family education    PT Goals (Current goals can be found in the Care Plan section)  Acute Rehab PT Goals Patient Stated Goal: return home PT Goal Formulation: With patient Time For Goal Achievement: 07/07/21 Potential to Achieve Goals: Good    Frequency Min 3X/week     Co-evaluation               AM-PAC PT "6 Clicks" Mobility  Outcome Measure Help needed turning from your back to your side while in a flat bed without using bedrails?: A Little Help needed  moving from lying on your back to sitting on the side of a flat bed without using bedrails?: A Little Help needed moving to and from a bed to a chair (including a wheelchair)?: A Little Help needed standing up from a chair using your arms (e.g., wheelchair or bedside chair)?: A Little Help needed to walk in  hospital room?: A Lot Help needed climbing 3-5 steps with a railing? : A Little 6 Click Score: 17    End of Session   Activity Tolerance: Patient tolerated treatment well;Patient limited by fatigue Patient left: in chair;with call bell/phone within reach;with chair alarm set;with nursing/sitter in room Nurse Communication: Mobility status PT Visit Diagnosis: Other abnormalities of gait and mobility (R26.89);Muscle weakness (generalized) (M62.81)    Time: 1583-0940 PT Time Calculation (min) (ACUTE ONLY): 26 min   Charges:   PT Evaluation $PT Eval Moderate Complexity: [place], PT, DPT Acute Rehabilitation Services  Pager [phone] Office [place] 06/23/2021, 10:24 AM

## 2021-06-23 NOTE — Progress Notes (Signed)
I was not told patient CBG was 68 . I went to check on patient and he stated, "I feel weak and bad." He was unable to tell me why he felt bad. Gave patient Ginger Ale and Graham crackers ." Rechecked CBG  WAS 46 .Tim R.N. aware and into see patient and brought amp of D50 . Gave 1/2 ampl. D50 . Repeat CBG 141

## 2021-06-23 NOTE — Progress Notes (Signed)
Pt is transferred from HD back to 4E24. He is alert and fully oriented x 4. Vital signs remain stable.   BP 105/55, HR 76, Atrial fib on the monitor. No acute distress at arrival. We will continue to monitor.  Kennyth Lose, RN

## 2021-06-23 NOTE — Care Management Important Message (Signed)
Important Message  Patient Details  Name: Nathaniel White MRN: [id number] Date of Birth: 01-13-1960   Medicare Important Message Given:  Yes     Jonas Goh Montine Circle 06/23/2021, 1:55 PM

## 2021-06-23 NOTE — Progress Notes (Signed)
SATURATION QUALIFICATIONS: (This note is used to comply with regulatory documentation for home oxygen)  Patient Saturations on Room Air at Rest = 95%  Patient Saturations on Room Air while Ambulating = 94%  Patient Saturations on 1 Liters of oxygen while Ambulating = 99%  Please briefly explain why patient needs home oxygen: No O2 need identified.  Of note, difficult to obtain timely O2 readings due to poor pleth waveform at times, and increased time to register O2.    06/23/2021  RP, OTR/L  Acute Rehabilitation Services  Office:  [phone]

## 2021-06-23 NOTE — Progress Notes (Signed)
D/C order noted. Contacted [facility] SW to advise clinic of pt's d/c today and that pt will resume care tomorrow.   Delance Weide Renal Navigator [phone] 

## 2021-06-23 NOTE — Evaluation (Signed)
Occupational Therapy Evaluation Patient Details Name: Nathaniel White MRN: [id number] DOB: 05-26-1960 Today's Date: 06/23/2021   History of Present Illness Pt is a 61 y.o. male admitted 06/20/21 with LUE AVF bleeding after coughing spell. S/p LUE AVF ligation, LIJ tunneled HD cath placement 6/13. PMH includes stroke (residual L side weakness), ESRD (HD TTS), HTN, PVD, COPD, CHF, asthma, anxiety.   Clinical Impression   Patient admitted for the diagnosis above.  PTA he lives in a multi story home with friends.  He generally uses a 4WRW at home, has assist with iADL, but participates with ADL and toileting.  Currently he is most likely close to his baseline.  He is a little inconsistent with prior level of function.  No significant OT needs identified in the acute setting.  He anticipates returning home 6/17.  No post acute OT recommended.        Recommendations for follow up therapy are one component of a multi-disciplinary discharge planning process, led by the attending physician.  Recommendations may be updated based on patient status, additional functional criteria and insurance authorization.   Follow Up Recommendations  No OT follow up    Assistance Recommended at Discharge Intermittent Supervision/Assistance  Patient can return home with the following      Functional Status Assessment  Patient has had a recent decline in their functional status and demonstrates the ability to make significant improvements in function in a reasonable and predictable amount of time.  Equipment Recommendations  None recommended by OT    Recommendations for Other Services       Precautions / Restrictions Precautions Precautions: Fall Precaution Comments: h/o CVA with L-side residual weakness Restrictions Weight Bearing Restrictions: Yes LUE Weight Bearing: Weight bearing as tolerated LLE Weight Bearing: Weight bearing as tolerated      Mobility Bed Mobility Overal bed mobility: Needs  Assistance                  Transfers Overall transfer level: Needs assistance Equipment used: Rollator (4 wheels), Rolling walker (2 wheels) Transfers: Sit to/from Stand, Bed to chair/wheelchair/BSC Sit to Stand: Min assist, Min guard     Step pivot transfers: Min assist, Min guard            Balance Overall balance assessment: Needs assistance Sitting-balance support: No upper extremity supported, Feet supported Sitting balance-Leahy Scale: Fair     Standing balance support: Single extremity supported, Bilateral upper extremity supported Standing balance-Leahy Scale: Fair                             ADL either performed or assessed with clinical judgement   ADL       Grooming: Wash/dry hands;Wash/dry face;Set up;Sitting           Upper Body Dressing : Minimal assistance;Sitting   Lower Body Dressing: Minimal assistance;Sit to/from stand   Toilet Transfer: Minimal assistance;BSC/3in1;Stand-pivot   Toileting- Clothing Manipulation and Hygiene: Supervision/safety;Sitting/lateral lean               Vision Patient Visual Report: No change from baseline       Perception Perception Perception: Within Functional Limits   Praxis Praxis Praxis: Intact    Pertinent Vitals/Pain Pain Assessment Pain Assessment: Faces Faces Pain Scale: Hurts little more Pain Location: LUE Pain Descriptors / Indicators: Tender Pain Intervention(s): Monitored during session     Hand Dominance Left   Extremity/Trunk Assessment Upper Extremity Assessment Upper Extremity Assessment:  Generalized weakness;LUE deficits/detail LUE Deficits / Details: LUE weakness and flexor contractures; limited elbow extension and shoulder ROM -  unable to use the arm functionally LUE Coordination: decreased gross motor;decreased fine motor   Lower Extremity Assessment Lower Extremity Assessment: Defer to PT evaluation LLE Deficits / Details: h/o CVA with residual LLE  weakness; hip and knee observed functional strength at least 3/5, decreased ankle DF LLE Coordination: decreased gross motor;decreased fine motor   Cervical / Trunk Assessment Cervical / Trunk Assessment: Kyphotic   Communication Communication Communication: Expressive difficulties   Cognition Arousal/Alertness: Awake/alert Behavior During Therapy: WFL for tasks assessed/performed Overall Cognitive Status: No family/caregiver present to determine baseline cognitive functioning                                       General Comments  received pt getting dependent washup at bed-level with NT; encouraged pt to do this OOB next time since he is ambulatory with assist. pt c/o weakness standing, BP 101/53, HR 80. discussed safe d/c home, pt reports having necessary DME and assist    Exercises     Shoulder Instructions      Home Living Family/patient expects to be discharged to:: Private residence Living Arrangements: Non-relatives/Friends Available Help at Discharge: Friend(s);Available 24 hours/day Type of Home: House Home Access: Stairs to enter [employer] of Steps: 2   Home Layout: Two level;Able to live on main level with bedroom/bathroom Alternate Level Stairs-Number of Steps: flight Alternate Level Stairs-Rails: Right;Left Bathroom Shower/Tub: Tub/shower unit;Curtain   Bathroom Toilet: Standard     Home Equipment: BSC/3in1;Rollator (4 wheels);Shower seat;Cane - single point;Wheelchair - manual   Additional Comments: lives with two girls who are "good friends" and assist with as much as needed at home      Prior Functioning/Environment Prior Level of Function : Needs assist             Mobility Comments: typically furniture surfs around home for ambulation, reports use of rollator for "longer distances"; drives sometimes ADLs Comments: Assist as needed ADL and iADL              OT Treatment/Interventions:      OT Goals(Current  goals can be found in the care plan section) Acute Rehab OT Goals Patient Stated Goal: Going home tomorrow OT Goal Formulation: With patient Time For Goal Achievement: 06/30/21 Potential to Achieve Goals: Good ADL Goals Pt Will Perform Lower Body Dressing: with min assist;sit to/from stand Pt Will Transfer to Toilet: with supervision;stand pivot transfer  OT Frequency:      Co-evaluation              AM-PAC OT "6 Clicks" Daily Activity     Outcome Measure Help from another person eating meals?: None Help from another person taking care of personal grooming?: A Little Help from another person toileting, which includes using toliet, bedpan, or urinal?: A Little Help from another person bathing (including washing, rinsing, drying)?: A Little Help from another person to put on and taking off regular upper body clothing?: A Little Help from another person to put on and taking off regular lower body clothing?: A Little 6 Click Score: 19   End of Session Equipment Utilized During Treatment: Rolling walker (2 wheels);Rollator (4 wheels) Nurse Communication: Mobility status  Activity Tolerance: Patient tolerated treatment well Patient left: in chair;with call bell/phone within reach  OT Visit Diagnosis: Unsteadiness on  feet (R26.81);Pain Pain - Right/Left: Left Pain - part of body: Arm                Time: 6950-7225 OT Time Calculation (min): 31 min Charges:  OT General Charges $OT Visit: 1 Visit OT Evaluation $OT Eval Moderate Complexity: 1 Mod OT Treatments $Therapeutic Activity: 8-22 mins  06/23/2021  RP, OTR/L  Acute Rehabilitation Services  Office:  [phone]   Metta Clines 06/23/2021, 1:51 PM

## 2021-06-23 NOTE — Progress Notes (Signed)
Pt is out of the floor with O2 NCL 4 LPM, transferring to HD for hemodialysis. He is hemodynamically stable, no distress prior transferring. Report was given to [place], HD RN.   Kennyth Lose, RN

## 2021-06-23 NOTE — TOC Transition Note (Signed)
Transition of Care (TOC) - CM/SW Discharge Note Marvetta Gibbons RN, BSN Transitions of Care Unit 4E- RN Case Manager See Treatment Team for direct phone #    Patient Details  Name: UMBERTO PAVEK MRN: [id number] Date of Birth: [birth year]  Transition of Care [facility]) CM/SW Contact:  Dawayne Patricia, RN Phone Number: 06/23/2021, 3:52 PM   Clinical Narrative:    Pt stable for transition home today, noted recs by PT for HHPT f/u- MD notified and to place [facility] order.   CM spoke with pt at bedside for [facility] needs- pt agreeable and want [place] f/u. Choice offered for [facility] Per CMS guidelines from medicare.gov website with star ratings (copy placed in shadow chart) , pt reports he has had [facility] in past does not remember name and states he does not have a preference. Per review in Patient [place]- pt has used [facility] in past- CM will refer back to [facility] for new [facility] referral.   Address, phone # and PCP all confirmed with pt.  Pt reports he should have ride home today- will start calling to see who can come pick him up for transport home.   Call made to [facility] with [facility]- referral has been accepted for HHPT.    Final next level of care: [place] Barriers to Discharge: No Barriers Identified   Patient Goals and CMS Choice Patient states their goals for this hospitalization and ongoing recovery are:: return home CMS Medicare.gov Compare Post Acute Care list provided to:: Patient Choice offered to / list presented to : Patient  Discharge Placement               Home w/ [facility]        Discharge Plan and Services   Discharge Planning Services: CM Consult Post Acute Care Choice: Home Health          DME Arranged: N/A DME Agency: NA       HH Arranged: PT [place] Agency: [place] ([place]) Date HH Agency Contacted: 06/23/21 Time [place]: [place] Representative spoke with at [place]: [place] ([place]) Interventions     Readmission  Risk Interventions    06/23/2021    3:52 PM 03/15/2021   12:40 PM  Readmission Risk Prevention Plan  Transportation Screening Complete Complete  PCP or Specialist Appt within 5-7 Days  Complete  PCP or Specialist Appt within 3-5 Days Complete   Home Care Screening  Complete  Medication Review (RN CM)  Complete  HRI or Home Care Consult Complete   Social Work Consult for [place] Planning/Counseling Complete   Palliative Care Screening Not Applicable   Medication Review Press photographer) Complete

## 2021-06-23 NOTE — Progress Notes (Addendum)
      Subjective  - No new left UE complaints   Objective (!) 105/55 83 97.7 F (36.5 C) (Oral) 15 95%  Intake/Output Summary (Last 24 hours) at 06/23/2021 0734 Last data filed at 06/23/2021 0421 Gross per 24 hour  Intake 830 ml  Output 1500 ml  Net -670 ml   Left UE incisions healing well with intact staples Left arm warm to palpation with minimal edema and intact minimal motor  Lungs non labored with O2 support    Assessment/Planning: 61 y.o. male is s/p left upper arm fistula ligation for bleeding and placement of tunneled dialysis catheter.  We will defer permanent access until patient has recovered.  F/U 3-4 weeks is scheduled   Roxy Horseman 06/23/2021 7:34 AM --  Laboratory Lab Results: Recent Labs    06/22/21 0501 06/23/21 0236  WBC 13.0* 11.5*  HGB 8.7* 8.0*  HCT 26.0* 25.2*  PLT 40* 47*   BMET Recent Labs    06/22/21 0501 06/23/21 0236  NA 129* 136  K 5.7* 6.1*  CL 93* 93*  CO2 21* 21*  GLUCOSE 114* 99  BUN 68* 93*  CREATININE 9.36* 10.55*  CALCIUM 8.2* 8.9    COAG Lab Results  Component Value Date   INR 1.2 06/20/2021   INR 1.4 (H) 06/20/2021   INR 1.0 10/08/2013   No results found for: "PTT"  I have independently interviewed and examined patient and agree with PA assessment and plan above.   Kiyoko Mcguirt C. Donzetta Matters, MD [facility] Office: [phone] Pager: [phone]

## 2021-06-23 NOTE — Progress Notes (Signed)
Pt removed HD cath dressing.  IV Team consult due to potential minor cath exposure.

## 2021-06-23 NOTE — Progress Notes (Signed)
[facility] Progress Note  Subjective: seen in room, feeling good today. May be going home.   Vitals:   06/23/21 0815 06/23/21 1031 06/23/21 1034 06/23/21 1130  BP: 108/71   (!) 104/57  Pulse: 81   86  Resp: 16   18  Temp: 98.1 F (36.7 C)   97.8 F (36.6 C)  TempSrc: Oral   Oral  SpO2: 93% 100% 99% 94%  Weight:      Height:        Exam: Gen alert, nasal O2, no distress No jvd or bruits Chest gen dec'd BS bilat, no rales RRR no RG Abd soft ntnd no mass or ascites +bs Ext no edema Neuro is alert, Ox 3 , nf    LUA AVF no bruit, staples in place, wound is clean    new IJ [facility]         Home meds include - acetminophen, albuterol, apixaban, dialyvite, docusate, fluticasone-salmeterol, hydroxyzine, midodrine 10 tid, montelukast, velphoro 1 gm ac     CXR 6/13 - IMPRESSION: Widespread patchy areas of interstitial prominence, peribronchial cuffing, patchy ill-defined opacities and areas of architectural distortion are again noted throughout the lungs bilaterally, most evident in the mid to lower lungs (right-greater-than-left) with areas of chronic pleuroparenchymal scarring and architectural distortion in the right mid to lower lung, similar to prior examinations.    OP HD: SW TTS 4h  450/500  75.5kg   2/2 bath P2  TDC (AVF ligated, new TDC)   Hep none - last HD 6/10, 80.7 > 77.7kg - doxercalciferol 5 ug tiw IV - etecalcetide 7.5 mg tiw IV - no esa     Assessment/ Plan: Bleeding AVF - sp AVF ligation by VVS 6/13. New TDC placed. Next permanent access to be planned in OP setting per VVS.  ESRD - on HD TTS.  Resp - has COPD and CXR shows chronic lung scarring from here and prior admits. Not sure if on home O2 or not. Per pmd.  BP/ volume - on midodrine 10 tid, continuing here. No vol excess on exam, chronic chg's on xray.  Anemia esrd + abl - transfuse prn. Not on esa at op unit.  MBD ckd - CCa in range, phos 8s. Cont velphoro as binder, cont vdra IV.   Dispo -  for dc home today per pmd     Rob Caraway 06/23/2021, 2:06 PM   Recent Labs  Lab 06/22/21 0501 06/23/21 0236  HGB 8.7* 8.0*  ALBUMIN 2.7* 2.6*  CALCIUM 8.2* 8.9  PHOS 8.1* 10.0*  CREATININE 9.36* 10.55*  K 5.7* 6.1*    Inpatient medications:  arformoterol  15 mcg Nebulization BID   Chlorhexidine Gluconate Cloth  6 each Topical Q0600   Chlorhexidine Gluconate Cloth  6 each Topical Q0600   docusate sodium  100 mg Oral BID   doxercalciferol  5 mcg Intravenous Q T,Th,Sa-HD   doxycycline  100 mg Oral Q12H   methylPREDNISolone (SOLU-MEDROL) injection  40 mg Intravenous Daily   midodrine  10 mg Oral TID WC   montelukast  10 mg Oral QHS   pantoprazole  40 mg Oral Daily   revefenacin  175 mcg Nebulization Daily   sucroferric oxyhydroxide  1,000 mg Oral BID PC    piperacillin-tazobactam (ZOSYN)  IV 2.25 g (06/23/21 0559)   acetaminophen **OR** acetaminophen, albuterol, alum & mag hydroxide-simeth, HYDROmorphone (DILAUDID) injection, labetalol, metoprolol tartrate, ondansetron, mouth rinse, oxyCODONE-acetaminophen, phenol, senna-docusate

## 2021-06-23 NOTE — Progress Notes (Signed)
Physical Therapy Treatment Patient Details Name: Nathaniel White MRN: [id number] DOB: 1960-05-25 Today's Date: 06/23/2021   History of Present Illness Pt is a 61 y.o. male admitted 06/20/21 with LUE AVF bleeding after coughing spell. S/p LUE AVF ligation, LIJ tunneled HD cath placement 6/13. PMH includes stroke (residual L side weakness), ESRD (HD TTS), HTN, PVD, COPD, CHF, asthma, anxiety.   PT Comments    Pt seen for additional session in preparation for potential d/c home. Pt declines gait training with quad cane or any other DME, preference for holding onto furniture for standing and step pivot transfers. Pt requires cues to perform ADL task (washup/pericare from bowel incontinence) to completion, min guard due to instability. Increased time discussing safety and fall risk reduction upon return home; pt continues to report having necessary assist and DME upon return home. If to remain admitted, will continue to follow acutely.    Recommendations for follow up therapy are one component of a multi-disciplinary discharge planning process, led by the attending physician.  Recommendations may be updated based on patient status, additional functional criteria and insurance authorization.  Follow Up Recommendations  Home health PT     Assistance Recommended at Discharge Intermittent Supervision/Assistance  Patient can return home with the following A little help with walking and/or transfers;A little help with bathing/dressing/bathroom;Assistance with cooking/housework;Assist for transportation;Help with stairs or ramp for entrance   Equipment Recommendations  None recommended by PT    Recommendations for Other Services       Precautions / Restrictions Precautions Precautions: Fall;Other (comment) Precaution Comments: h/o CVA with L-side residual weakness; bowel incontinence Restrictions Weight Bearing Restrictions: Yes LUE Weight Bearing: Weight bearing as tolerated LLE Weight Bearing:  Weight bearing as tolerated     Mobility  Bed Mobility Overal bed mobility: Needs Assistance Bed Mobility: Rolling, Sidelying to Sit Rolling: Modified independent (Device/Increase time) Sidelying to sit: Min assist, HOB elevated       General bed mobility comments: received sitting in recliner    Transfers Overall transfer level: Needs assistance Equipment used: None, 1 person hand held assist Transfers: Sit to/from Stand, Bed to chair/wheelchair/BSC Sit to Stand: Min guard, Min assist   Step pivot transfers: Min assist, Min guard       General transfer comment: pt reliant on momentum and RUE support to power up from recliner, pivotal steps from recliner<>BSC reliant on RUE support of furniture to stabilize; 2x sit<>stand from [facility] with min guard to minA for trunk elevation    Ambulation/Gait               General Gait Details: pt declined gait training with quad cane, declined ambulation   Stairs             Wheelchair Mobility    Modified Rankin (Stroke Patients Only)       Balance Overall balance assessment: Needs assistance Sitting-balance support: No upper extremity supported, Feet supported Sitting balance-Leahy Scale: Fair     Standing balance support: No upper extremity supported, Single extremity supported Standing balance-Leahy Scale: Fair Standing balance comment: pt can static stand without UE support to perform posterior pericare with RUE, unable to accept significant challenge to balance                            Cognition Arousal/Alertness: Awake/alert Behavior During Therapy: WFL for tasks assessed/performed Overall Cognitive Status: No family/caregiver present to determine baseline cognitive functioning  General Comments: pt opting to sit back down on recliner to have BM instead of requesting BSC or walk to bathroom, required encouragement to at least get to [facility]         Exercises Other Exercises Other Exercises: LUE AROM/AAROM elbow flex/ext, wrist flex/ext, finger flex/ext    General Comments General comments (skin integrity, edema, etc.): pt with multiple bouts of bowel incontinence requiring cues/encouragement to fully clean self; pt able to bathe self (upper legs, perineal area) with assist for set-up, min guard for standing balance. additional discussion regarding safe d/c plan, pt adamant he has necessary support, encouraged pt to perform tasks as independently as possible; educ on strategies for fall risk reduction, pt demonstrates poor teachback of education/discussion      Pertinent Vitals/Pain Pain Assessment Pain Assessment: Faces Faces Pain Scale: No hurt Pain Location: LUE Pain Descriptors / Indicators: Operative site guarding Pain Intervention(s): Monitored during session    Home Living Family/patient expects to be discharged to:: Private residence Living Arrangements: Non-relatives/Friends Available Help at Discharge: Friend(s);Available 24 hours/day Type of Home: House Home Access: Stairs to enter   Entrance Stairs-Number of Steps: 2 Alternate Level Stairs-Number of Steps: flight Home Layout: Two level;Able to live on main level with bedroom/bathroom Home Equipment: BSC/3in1;Rollator (4 wheels);Shower seat;Cane - single point;Wheelchair - manual      Prior Function            PT Goals (current goals can now be found in the care plan section) Progress towards PT goals: Progressing toward goals    Frequency    Min 3X/week      PT Plan Current plan remains appropriate    Co-evaluation              AM-PAC PT "6 Clicks" Mobility   Outcome Measure  Help needed turning from your back to your side while in a flat bed without using bedrails?: A Little Help needed moving from lying on your back to sitting on the side of a flat bed without using bedrails?: A Little Help needed moving to and from a bed to a chair  (including a wheelchair)?: A Little Help needed standing up from a chair using your arms (e.g., wheelchair or bedside chair)?: A Little Help needed to walk in hospital room?: A Lot Help needed climbing 3-5 steps with a railing? : A Lot 6 Click Score: 16    End of Session   Activity Tolerance: Patient tolerated treatment well;Patient limited by fatigue Patient left: in chair;with call bell/phone within reach;with chair alarm set Nurse Communication: Mobility status PT Visit Diagnosis: Other abnormalities of gait and mobility (R26.89);Muscle weakness (generalized) (M62.81)     Time: 7414-2395 PT Time Calculation (min) (ACUTE ONLY): 26 min  Charges:  $Therapeutic Activity: 8-22 mins $Self Care/Home Management: [place], PT, DPT Acute Rehabilitation Services  Pager [phone] Office [phone]  Derry Lory 06/23/2021, 4:49 PM

## 2021-06-24 DIAGNOSIS — E162 Hypoglycemia, unspecified: Secondary | ICD-10-CM

## 2021-06-24 DIAGNOSIS — J850 Gangrene and necrosis of lung: Secondary | ICD-10-CM | POA: Diagnosis not present

## 2021-06-24 LAB — CBC WITH DIFFERENTIAL/PLATELET
Abs Immature Granulocytes: 0.05 10*3/uL (ref 0.00–0.07)
Basophils Absolute: 0 10*3/uL (ref 0.0–0.1)
Basophils Relative: 0 %
Eosinophils Absolute: 0.2 10*3/uL (ref 0.0–0.5)
Eosinophils Relative: 1 %
HCT: 26.7 % — ABNORMAL LOW (ref 39.0–52.0)
Hemoglobin: 8.6 g/dL — ABNORMAL LOW (ref 13.0–17.0)
Immature Granulocytes: 0 %
Lymphocytes Relative: 8 %
Lymphs Abs: 0.9 10*3/uL (ref 0.7–4.0)
MCH: 30.4 pg (ref 26.0–34.0)
MCHC: 32.2 g/dL (ref 30.0–36.0)
MCV: 94.3 fL (ref 80.0–100.0)
Monocytes Absolute: 0.8 10*3/uL (ref 0.1–1.0)
Monocytes Relative: 7 %
Neutro Abs: 9.3 10*3/uL — ABNORMAL HIGH (ref 1.7–7.7)
Neutrophils Relative %: 84 %
Platelets: 58 10*3/uL — ABNORMAL LOW (ref 150–400)
RBC: 2.83 MIL/uL — ABNORMAL LOW (ref 4.22–5.81)
RDW: 16.8 % — ABNORMAL HIGH (ref 11.5–15.5)
WBC: 11.2 10*3/uL — ABNORMAL HIGH (ref 4.0–10.5)
nRBC: 0.5 % — ABNORMAL HIGH (ref 0.0–0.2)

## 2021-06-24 LAB — CULTURE, RESPIRATORY W GRAM STAIN: Culture: NORMAL

## 2021-06-24 LAB — BASIC METABOLIC PANEL
Anion gap: 21 — ABNORMAL HIGH (ref 5–15)
BUN: 59 mg/dL — ABNORMAL HIGH (ref 6–20)
CO2: 20 mmol/L — ABNORMAL LOW (ref 22–32)
Calcium: 8.6 mg/dL — ABNORMAL LOW (ref 8.9–10.3)
Chloride: 94 mmol/L — ABNORMAL LOW (ref 98–111)
Creatinine, Ser: 7.41 mg/dL — ABNORMAL HIGH (ref 0.61–1.24)
GFR, Estimated: 8 mL/min — ABNORMAL LOW (ref >60)
Glucose, Bld: 75 mg/dL (ref 70–99)
Potassium: 5 mmol/L (ref 3.5–5.1)
Sodium: 135 mmol/L (ref 135–145)

## 2021-06-24 LAB — AMMONIA: Ammonia: 15 umol/L (ref 9–35)

## 2021-06-24 LAB — GLUCOSE, CAPILLARY
Glucose-Capillary: 74 mg/dL (ref 70–99)
Glucose-Capillary: 100 mg/dL — ABNORMAL HIGH (ref 70–99)
Glucose-Capillary: 84 mg/dL (ref 70–99)

## 2021-06-24 MED ORDER — SODIUM CHLORIDE 0.9 % IV SOLN
3.0000 g | INTRAVENOUS | Status: DC
  Administered 2021-06-24 – 2021-06-25 (×2): 3 g via INTRAVENOUS
  Filled 2021-06-24 (×3): qty 8

## 2021-06-24 MED ORDER — HEPARIN SODIUM (PORCINE) 1000 UNIT/ML IJ SOLN
INTRAMUSCULAR | Status: AC
  Filled 2021-06-24: qty 4

## 2021-06-24 MED ORDER — CHLORHEXIDINE GLUCONATE CLOTH 2 % EX PADS
6.0000 | MEDICATED_PAD | Freq: Every day | CUTANEOUS | Status: DC
  Administered 2021-06-25 – 2021-06-26 (×2): 6 via TOPICAL

## 2021-06-24 MED ORDER — APIXABAN 2.5 MG PO TABS
2.5000 mg | ORAL_TABLET | Freq: Two times a day (BID) | ORAL | Status: DC
  Administered 2021-06-24 – 2021-06-25 (×3): 2.5 mg via ORAL
  Filled 2021-06-24 (×4): qty 1

## 2021-06-24 NOTE — Assessment & Plan Note (Addendum)
He was symptomatic when this occurred, but this has not been recurrent A1c 5.4, he didn't receive any insulin or hypoglycemics  Unclear what put him at risk for symptomatic hypoglycemia, but this has not been recurrent Follow outpatient, w/u as indicated

## 2021-06-24 NOTE — Progress Notes (Signed)
ANTICOAGULATION CONSULT NOTE - Initial Consult  Pharmacy Consult for Apixaban Indication: atrial fibrillation  Allergies  Allergen Reactions   Codeine Shortness Of Breath   Dextromethorphan-Guaifenesin Shortness Of Breath   Iron Dextran Shortness Of Breath   Morphine And Related Shortness Of Breath    Patient Measurements: Height: '5\' 9"'$  (175.3 cm) Weight: 77.1 kg (169 lb 15.6 oz) IBW/kg (Calculated) : 70.7  Vital Signs: Temp: 98.3 F (36.8 C) (06/17 1713) Temp Source: Oral (06/17 1713) BP: 118/78 (06/17 1713) Pulse Rate: 87 (06/17 1713)  Labs: Recent Labs    06/22/21 0501 06/23/21 0236 06/24/21 0751  HGB 8.7* 8.0* 8.6*  HCT 26.0* 25.2* 26.7*  PLT 40* 47* 58*  CREATININE 9.36* 10.55* 7.41*    Estimated Creatinine Clearance: 10.9 mL/min (A) (by C-G formula based on SCr of 7.41 mg/dL (H)).   Medical History: Past Medical History:  Diagnosis Date   Anemia    Anxiety    Asthma    CHF (congestive heart failure) ([place])    Complication from renal dialysis device 19/50/9326   Complication of anesthesia 2017   according to pt and spouse pt was moving around and cough while under and pt had difficulty waking up so the anesthesia had to be reversed. and pt admitted to ICU.    COPD (chronic obstructive pulmonary disease) (H[facility])    Diabetes mellitus without complication ([place])    Type II - Patient states he does not have diabetes, "they said sometimes when you start dialysis you don't have diabetes any longer"    ESOPHAGEAL VARICES 10/04/2008   Qualifier: Diagnosis of  By: Fuller Plan MD Lamont Snowball T    ESRD    on HD, T-TH-Sat - [place]   Hemiparesis due to old stroke [facility])    left   Hypertension    Hx, not current problems, no meds   LV dysfunction    EF 25-30% by echo 07/2011   Memory loss due to medical condition    due to stroke   MR (mitral regurgitation)    moderate to severe, echo 07/2011   Peripheral vascular disease ([place])    Shortness of breath    with  exertion   Stroke ([place])    TIA's-left sided weakness   Tobacco abuse     Assessment:  AC: 61 y.o. M on apixaban 2.'5mg'$  po BID for afib. Apixaban has been held since admission (reversed with Kcentra for L arm fistula rupture). Since has had plt <50. Now plt 58 so restarting apixaban. Hgb 8.6.  ID: Changing Zosyn to Unasyn for CAP (total abx D#4). ESRD pt - pt in HD right now. Cx ngtd.  Goal of Therapy:  Prevention of CVA Monitor platelets by anticoagulation protocol: Yes   Plan:  Apixaban 2.'5mg'$  po BID (as pta) Will f/u plt closely Unasyn 3gm IV q24h Will f/u micro data and pt's clinical condition F/u LOT with abx  Sherlon Handing, PharmD, BCPS Please see amion for complete clinical pharmacist phone list 06/24/2021,5:49 PM

## 2021-06-24 NOTE — Progress Notes (Addendum)
[facility] Progress Note  Subjective: seen in room, feeling good today. May be going home.   Vitals:   06/24/21 0024 06/24/21 0426 06/24/21 0647 06/24/21 0833  BP: 90/62 108/83    Pulse: 83 81    Resp: 19 18 (!) 24   Temp:  98.7 F (37.1 C)    TempSrc: Axillary Oral    SpO2: 97% 95%  95%  Weight:   75.4 kg   Height:        Exam: Gen alert, nasal O2, no distress No jvd or bruits Chest bilat crackles 1/3 up RRR no RG Abd soft ntnd no mass or ascites +bs Ext +1-2 edema bilat  Neuro is alert, Ox 3 , nf    LUA AVF no bruit, staples in place, wound is clean    new IJ [facility]         Home meds include - acetminophen, albuterol, apixaban, dialyvite, docusate, fluticasone-salmeterol, hydroxyzine, midodrine 10 tid, montelukast, velphoro 1 gm ac     CXR 6/13 - IMPRESSION: Widespread patchy areas of interstitial prominence, peribronchial cuffing, patchy ill-defined opacities and areas of architectural distortion are again noted throughout the lungs bilaterally, most evident in the mid to lower lungs (right-greater-than-left) with areas of chronic pleuroparenchymal scarring and architectural distortion in the right mid to lower lung, similar to prior examinations.    OP HD: SW TTS 4h  450/500  75.5kg   2/2 bath P2  TDC (AVF ligated, new TDC)   Hep none - last HD 6/10, 80.7 > 77.7kg - doxercalciferol 5 ug tiw IV - etecalcetide 7.5 mg tiw IV - no esa     Assessment/ Plan: Bleeding AVF - sp AVF ligation by VVS 6/13. New TDC placed. Next permanent access to be planned in OP setting per [facility]VS.  ESRD - on HD TTS. HD today.  Resp - has COPD and CXR shows chronic changes but can't exclude fluid. CT chest w/ ground glass changes also suggesting vol ^. As per #4.  BP/ volume - on midodrine 10 tid. +LE edema, worried he is wet and may need dry wt lowered. Max UF w/ HD today as tolerated. Consider extra HD as needed.  Anemia esrd + abl - transfuse prn. Not on esa at op unit.  MBD  ckd - CCa in range, phos 8s. Cont velphoro as binder, cont vdra IV.        Rob Monserratt Knezevic 06/24/2021, 11:42 AM   Recent Labs  Lab 06/22/21 0501 06/23/21 0236 06/24/21 0751  HGB 8.7* 8.0* 8.6*  ALBUMIN 2.7* 2.6*  --   CALCIUM 8.2* 8.9 8.6*  PHOS 8.1* 10.0*  --   CREATININE 9.36* 10.55* 7.41*  K 5.7* 6.1* 5.0    Inpatient medications:  arformoterol  15 mcg Nebulization BID   Chlorhexidine Gluconate Cloth  6 each Topical Q0600   Chlorhexidine Gluconate Cloth  6 each Topical Q0600   docusate sodium  100 mg Oral BID   doxercalciferol  5 mcg Intravenous Q T,Th,Sa-HD   doxycycline  100 mg Oral Q12H   methylPREDNISolone (SOLU-MEDROL) injection  40 mg Intravenous Daily   midodrine  10 mg Oral TID WC   montelukast  10 mg Oral QHS   pantoprazole  40 mg Oral Daily   revefenacin  175 mcg Nebulization Daily   sucroferric oxyhydroxide  1,000 mg Oral BID PC    piperacillin-tazobactam (ZOSYN)  IV 2.25 g (06/24/21 0530)   acetaminophen **OR** acetaminophen, albuterol, alum & mag hydroxide-simeth, HYDROmorphone (DILAUDID) injection,  labetalol, metoprolol tartrate, ondansetron, mouth rinse, oxyCODONE-acetaminophen, phenol, senna-docusate

## 2021-06-24 NOTE — Progress Notes (Addendum)
PROGRESS NOTE    Nathaniel White  WER:[id number] DOB: Oct 29, 1960 DOA: 06/20/2021 PCP: Gildardo Pounds, NP  Chief Complaint  Patient presents with   Shortness of Breath   Vascular Access Problem    Brief Narrative:  61 yo with hx ESRD on HD, HFrEF, chronic restrictive lung disease, hx tobacco abuse, and multiple other medical issues who presented with increasing cough and shortness of breath, resulting in bleeding from his dialysis access.  He was admitted to vascular service for left arm brachiocephalic fistula ligation and L IJ 23 cm tunneled dialysis catheter placement.  TRH picked him up on 6/14 and he was noted to have persistent cough, shortness of breath, and new hypoxia.  CT showed findings concerning for necrotizing pneumonia.  Pulmonology consulted.  He's gradually improved with antibiotics and steroids.  Blood counts notable for anemia (relatively stable after initial downtrend) and thrombocytopenia.  Plan for discharge on abx and steroids with outpatient follow up with pulmonology, renal, PCP, vascular, and cardiology.  Eliquis on hold due to thrombocytopenia.   See below for additional details  See below for additional details    Assessment & Plan:   Principal Problem:   Necrotizing pneumonia ([place]) Active Problems:   Hypoglycemia   Acute respiratory failure with hypoxia ([place])   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 (HCC)   ESRD (end stage renal disease) (HCC)   Confusion   Restrictive lung disease   HFrEF (heart failure with reduced ejection fraction) (HCC)   Paroxysmal atrial fibrillation (HCC)   Type 2 diabetes mellitus (HCC)   CVA (cerebral vascular accident) ([place])   Chronic hypotension   Abnormal CT scan   Anemia   Thrombocytopenia (HCC)   CAD (coronary artery disease)   Aortic atherosclerosis (HCC)   Cirrhosis (HCC)   Assessment and Plan: * Necrotizing pneumonia ([place]) Negative MRSA PCR Respiratory culture with normal  resp flora Afebrile, elevated wbc in setting of steroids CT as above with findings concerning for necrotizing pneumonia Pulmonology c/s in setting of necrotizing pneumonia  Urine strep, legionella (he may not be able to give urine sample) MRSA PCR negative Blood cultures NGx2 Continue IV abx inpatient, will discharge with augmentin and doxycycline as well as Donnie Panik few more days of steroids Needs repeat imaging in about 6 weeks to ensure resolution of opacity   Hypoglycemia He was symptomatic when this occurred last night A1c 5.4, he didn't receive any insulin or hypoglycemics  Unclear what put him at risk for hypoglycemia last night Continue q6 BG checks Will work up additionally if this is recurrent  Acute respiratory failure with hypoxia ([place]) Related to necrotizing pneumonia below +/- hx chronic restrictive lung disease Ct with patchy airspace consolidation in superior segment of LLL concerning for necrotizing pneumonia Volume per renal  abx as below Steroids per pulm Nebs per pulm Hypoxia resolved  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 (HCC) S/p L arm brachiocephalic fistula ligation and L IJ 23 cm tunneled dialysis catheter placement 6/13 Plan for permanent access outpatient Staples in place, per vascular, follow in 3-4 weeks  Confusion Mild confusion today, doesn't seem too far from his baseline.  Will continue to monitor for now. Follow ammonia with cirrhosis  ESRD (end stage renal disease) (HCC) TTS dialysis Midodrine TID Volume/lytes per renal   Restrictive lung disease Needs pulm follow up outpatient advair at discharge  Paroxysmal atrial fibrillation ([place]) eliquis currently on hold Will resume with platelets over 50,000 (follow closely outpatient)  HFrEF (heart failure  with reduced ejection fraction) (HCC) EF 35%, RVSF moderately reduced, severely elevated PASP Volume per renal    Type 2 diabetes mellitus (HCC) A1c 5.4  03/2021 and 10 years prior  Unclear history, would follow outpatient  Chronic hypotension midodrine  CVA (cerebral vascular accident) ([place]) Hx CVA L sided weakness residual  Abnormal CT scan CT abd/pelvis for abdominal discomfort without revealing cause, but notable for unusual appearance of kidneys with innumerable lesions (see report) - possibility of aggressive renal neoplasms is not excluded -> further evaluation with non emergent abdominal MRI recommended (he can't get gadolinium with ESRD) Needs outpatient follow up  Thrombocytopenia ([place]) Hold eliquis until platelets >50,000 Can resume cautiously at that point Hematology referral, needs to follow with cardiology as well  Anemia Relatively stable around 8 over past few days downtrended with initial bleeding Follow outpatient  Cirrhosis ([place]) Follow with GI outpatient, this maybe cause of thrombocytopenia          DVT prophylaxis: eliquis Code Status: full Family Communication: none Disposition:   Status is: Inpatient Remains inpatient appropriate because: IV abx, watching BG another 24 hrs   Consultants:  Renal Vascular PCCM  Procedures:  6/13 Left arm brachiocephalic fistula ligation Left IJ 23 cm tunneled dialysis catheter placement   Antimicrobials:  Anti-infectives (From admission, onward)    Start     Dose/Rate Route Frequency Ordered Stop   06/24/21 2000  Ampicillin-Sulbactam (UNASYN) 3 g in sodium chloride 0.9 % 100 mL IVPB        3 g 200 mL/hr over 30 Minutes Intravenous Every 24 hours 06/24/21 1756     06/23/21 0000  doxycycline (VIBRA-TABS) 100 MG tablet        100 mg Oral Every 12 hours 06/23/21 1516 06/28/21 2359   06/23/21 0000  amoxicillin-clavulanate (AUGMENTIN) 500-125 MG tablet        500 mg Oral Daily 06/23/21 1516 06/28/21 2359   06/22/21 1800  ceFEPIme (MAXIPIME) 2 g in sodium chloride 0.9 % 100 mL IVPB  Status:  Discontinued        2 g 200 mL/hr over 30 Minutes Intravenous  Every T-Th-Sa (Hemodialysis) 06/21/21 1209 06/21/21 1314   06/22/21 1200  vancomycin (VANCOREADY) IVPB 750 mg/150 mL  Status:  Discontinued        750 mg 150 mL/hr over 60 Minutes Intravenous Every T-Th-Sa (Hemodialysis) 06/21/21 1209 06/22/21 1314   06/21/21 1800  piperacillin-tazobactam (ZOSYN) IVPB 2.25 g  Status:  Discontinued        2.25 g 100 mL/hr over 30 Minutes Intravenous Every 8 hours 06/21/21 1314 06/24/21 1751   06/21/21 1515  doxycycline (VIBRA-TABS) tablet 100 mg        100 mg Oral Every 12 hours 06/21/21 1415     06/21/21 1300  vancomycin (VANCOREADY) IVPB 1750 mg/350 mL        1,750 mg 175 mL/hr over 120 Minutes Intravenous STAT 06/21/21 1205 06/21/21 2202   06/21/21 1300  ceFEPIme (MAXIPIME) 2 g in sodium chloride 0.9 % 100 mL IVPB        2 g 200 mL/hr over 30 Minutes Intravenous STAT 06/21/21 1205 06/21/21 1338   06/20/21 0945  ceFAZolin (ANCEF) IVPB 2g/100 mL premix  Status:  Discontinued        2 g 200 mL/hr over 30 Minutes Intravenous Every 8 hours 06/20/21 0942 06/20/21 0952       Subjective: No complaints Seen on dialysis   Objective: Vitals:   06/24/21 1630 06/24/21 1700 06/24/21  1713 06/24/21 1821  BP: 115/66  118/78 126/79  Pulse: 80 84 87 73  Resp: '18 20 19 20  '$ Temp:   98.3 F (36.8 C) 98.3 F (36.8 C)  TempSrc:   Oral Oral  SpO2: 98%  95% 99%  Weight:   77.1 kg   Height:        Intake/Output Summary (Last 24 hours) at 06/24/2021 1926 Last data filed at 06/24/2021 0530 Gross per 24 hour  Intake 410 ml  Output 0 ml  Net 410 ml   Filed Weights   06/24/21 1252 06/24/21 1306 06/24/21 1713  Weight: 82.5 kg 82.5 kg 77.1 kg    Examination:  General exam: Appears calm and comfortable  Respiratory system: unlabored Cardiovascular system: RRR Gastrointestinal system: Abdomen is nondistended, soft and nontender. Central nervous system: Stacey Maura&Ox2, said January, moving all extremities Extremities: no LEE  Data Reviewed: I have personally reviewed  following labs and imaging studies  CBC: Recent Labs  Lab 06/20/21 2004 06/21/21 0415 06/21/21 0930 06/22/21 0501 06/23/21 0236 06/24/21 0751  WBC 7.3 7.1  --  13.0* 11.5* 11.2*  NEUTROABS  --   --   --  11.8* 10.4* 9.3*  HGB 9.0* 8.2* 8.4* 8.7* 8.0* 8.6*  HCT 26.9* 24.5* 26.0* 26.0* 25.2* 26.7*  MCV 91.5 93.2  --  95.9 95.1 94.3  PLT 56* 51*  --  40* 47* 58*    Basic Metabolic Panel: Recent Labs  Lab 06/20/21 2004 06/21/21 0415 06/21/21 0930 06/22/21 0501 06/23/21 0236 06/24/21 0751  NA 135 136  --  129* 136 135  K 3.8 4.5  --  5.7* 6.1* 5.0  CL 97* 98  --  93* 93* 94*  CO2 26 26  --  21* 21* 20*  GLUCOSE 155* 144*  --  114* 99 75  BUN 39* 45*  --  68* 93* 59*  CREATININE 6.04* 6.86*  --  9.36* 10.55* 7.41*  CALCIUM 8.4* 8.2*  --  8.2* 8.9 8.6*  MG 1.7  --   --  2.1 2.4  --   PHOS  --   --  6.9* 8.1* 10.0*  --     GFR: Estimated Creatinine Clearance: 10.9 mL/min (Cinzia Devos) (by C-G formula based on SCr of 7.41 mg/dL (H)).  Liver Function Tests: Recent Labs  Lab 06/20/21 0444 06/20/21 1024 06/21/21 0930 06/22/21 0501 06/23/21 0236  AST '18 15 20 27 21  '$ ALT '13 9 10 8 6  '$ ALKPHOS 60 39 38 35* 37*  BILITOT 1.5* 1.7* 0.9 1.1 1.1  PROT 7.2 5.2* 5.8* 5.7* 6.1*  ALBUMIN 3.4* 2.7* 2.8* 2.7* 2.6*    CBG: Recent Labs  Lab 06/23/21 2229 06/23/21 2253 06/24/21 0645 06/24/21 1228 06/24/21 1817  GLUCAP 46* 141* 74 100* 84     Recent Results (from the past 240 hour(s))  Resp Panel by RT-PCR (Flu Domanik [place]&B, Covid) Anterior Nasal Swab     Status: None   Collection Time: 06/20/21  6:13 AM   Specimen: Anterior Nasal Swab  Result Value Ref Range Status   SARS Coronavirus 2 by RT PCR NEGATIVE NEGATIVE Final    Comment: (NOTE) SARS-CoV-2 target nucleic acids are NOT DETECTED.  The SARS-CoV-2 RNA is generally detectable in upper respiratory specimens during the acute phase of infection. The lowest concentration of SARS-CoV-2 viral copies this assay can detect is 138  copies/mL. Kaleel Schmieder negative result does not preclude SARS-Cov-2 infection and should not be used as the sole basis for treatment or other patient  management decisions. Joelynn Dust negative result may occur with  improper specimen collection/handling, submission of specimen other than nasopharyngeal swab, presence of viral mutation(s) within the areas targeted by this assay, and inadequate number of viral copies(<138 copies/mL). Caterra Ostroff negative result must be combined with clinical observations, patient history, and epidemiological information. The expected result is Negative.  Fact Sheet for Patients:  EntrepreneurPulse.com.au  Fact Sheet for Healthcare Providers:  IncredibleEmployment.be  This test is no t yet approved or cleared by the [place] FDA and  has been authorized for detection and/or diagnosis of SARS-CoV-2 by FDA under an Emergency Use Authorization (EUA). This EUA will remain  in effect (meaning this test can be used) for the duration of the COVID-19 declaration under Section 564(b)(1) of the Act, 21 U.S.C.section 360bbb-3(b)(1), unless the authorization is terminated  or revoked sooner.       Influenza Esabella Stockinger by PCR NEGATIVE NEGATIVE Final   Influenza B by PCR NEGATIVE NEGATIVE Final    Comment: (NOTE) The Xpert Xpress SARS-CoV-2/FLU/RSV plus assay is intended as an aid in the diagnosis of influenza from Nasopharyngeal swab specimens and should not be used as Karletta Millay sole basis for treatment. Nasal washings and aspirates are unacceptable for Xpert Xpress SARS-CoV-2/FLU/RSV testing.  Fact Sheet for Patients: EntrepreneurPulse.com.au  Fact Sheet for Healthcare Providers: IncredibleEmployment.be  This test is not yet approved or cleared by the [place] FDA and has been authorized for detection and/or diagnosis of SARS-CoV-2 by FDA under an Emergency Use Authorization (EUA). This EUA will remain in effect (meaning  this test can be used) for the duration of the COVID-19 declaration under Section 564(b)(1) of the Act, 21 U.S.C. section 360bbb-3(b)(1), unless the authorization is terminated or revoked.  Performed at [facility], [place] [address], [place], [place] [address]   MRSA Next Gen by PCR, Nasal     Status: None   Collection Time: 06/20/21  8:39 PM   Specimen: Nasal Mucosa; Nasal Swab  Result Value Ref Range Status   MRSA by PCR Next Gen NOT DETECTED NOT DETECTED Final    Comment: (NOTE) The GeneXpert MRSA Assay (FDA approved for NASAL specimens only), is one component of Deontrey Massi comprehensive MRSA colonization surveillance program. It is not intended to diagnose MRSA infection nor to guide or monitor treatment for MRSA infections. Test performance is not FDA approved in patients less than 12 years old. Performed at [facility], [place] [address] [place],  [address]   Culture, blood (Routine X 2) w Reflex to ID Panel     Status: None (Preliminary result)   Collection Time: 06/21/21 12:51 PM   Specimen: BLOOD RIGHT ARM  Result Value Ref Range Status   Specimen Description BLOOD RIGHT ARM UPPER  Final   Special Requests   Final    BOTTLES DRAWN AEROBIC AND ANAEROBIC Blood Culture adequate volume   Culture   Final    NO GROWTH 3 DAYS Performed at [facility], [place] [address], [place],  [address]    Report Status PENDING  Incomplete  Culture, blood (Routine X 2) w Reflex to ID Panel     Status: None (Preliminary result)   Collection Time: 06/21/21  1:03 PM   Specimen: BLOOD RIGHT ARM  Result Value Ref Range Status   Specimen Description BLOOD RIGHT ARM UPPER  Final   Special Requests   Final    BOTTLES DRAWN AEROBIC AND ANAEROBIC Blood Culture adequate volume   Culture   Final  NO GROWTH 3 DAYS Performed at [facility], [place] [address], [place], [place] [address]    Report Status PENDING  Incomplete  Culture, Respiratory w Gram Stain     Status:  None   Collection Time: 06/21/21  5:21 PM   Specimen: Tracheal Aspirate; Respiratory  Result Value Ref Range Status   Specimen Description TRACHEAL ASPIRATE  Final   Special Requests NONE  Final   Gram Stain   Final    ABUNDANT SQUAMOUS EPITHELIAL CELLS PRESENT ABUNDANT WBC PRESENT, PREDOMINANTLY PMN RARE GRAM POSITIVE COCCI RARE GRAM NEGATIVE RODS    Culture   Final    RARE Normal respiratory flora-no Staph aureus or Pseudomonas seen Performed at [facility], [place] [address], [place], [place] [address]    Report Status 06/24/2021 FINAL  Final         Radiology Studies: No results found.      Scheduled Meds:  apixaban  2.5 mg Oral BID   arformoterol  15 mcg Nebulization BID   [START ON 06/25/2021] Chlorhexidine Gluconate Cloth  6 each Topical Q0600   docusate sodium  100 mg Oral BID   doxercalciferol  5 mcg Intravenous Q T,Th,Sa-HD   doxycycline  100 mg Oral Q12H   methylPREDNISolone (SOLU-MEDROL) injection  40 mg Intravenous Daily   midodrine  10 mg Oral TID WC   montelukast  10 mg Oral QHS   pantoprazole  40 mg Oral Daily   revefenacin  175 mcg Nebulization Daily   sucroferric oxyhydroxide  1,000 mg Oral BID PC   Continuous Infusions:  ampicillin-sulbactam (UNASYN) IV       LOS: 4 days    Time spent: over 30 min    Fayrene Helper, MD [facility]   To contact the attending provider between 7A-7P or the covering provider during after hours 7P-7A, please log into the web site [email or web address] and access using universal  password for that web site. If you do not have the password, please call the hospital operator.  06/24/2021, 7:26 PM

## 2021-06-24 NOTE — Progress Notes (Signed)
Patient cont. To have freq. Pvc's, couplet's and short runs of V-Tach  while in At. Fib. Patient voiced no complaints. Reviewing tele. Alarms he has been doing this.

## 2021-06-25 DIAGNOSIS — J850 Gangrene and necrosis of lung: Secondary | ICD-10-CM | POA: Diagnosis not present

## 2021-06-25 LAB — CBC WITH DIFFERENTIAL/PLATELET
Abs Immature Granulocytes: 0.07 10*3/uL (ref 0.00–0.07)
Basophils Absolute: 0 10*3/uL (ref 0.0–0.1)
Basophils Relative: 0 %
Eosinophils Absolute: 0 10*3/uL (ref 0.0–0.5)
Eosinophils Relative: 0 %
HCT: 25.2 % — ABNORMAL LOW (ref 39.0–52.0)
Hemoglobin: 8.3 g/dL — ABNORMAL LOW (ref 13.0–17.0)
Immature Granulocytes: 1 %
Lymphocytes Relative: 4 %
Lymphs Abs: 0.5 10*3/uL — ABNORMAL LOW (ref 0.7–4.0)
MCH: 30.3 pg (ref 26.0–34.0)
MCHC: 32.9 g/dL (ref 30.0–36.0)
MCV: 92 fL (ref 80.0–100.0)
Monocytes Absolute: 1 10*3/uL (ref 0.1–1.0)
Monocytes Relative: 9 %
Neutro Abs: 9.3 10*3/uL — ABNORMAL HIGH (ref 1.7–7.7)
Neutrophils Relative %: 86 %
Platelets: 65 10*3/uL — ABNORMAL LOW (ref 150–400)
RBC: 2.74 MIL/uL — ABNORMAL LOW (ref 4.22–5.81)
RDW: 16.7 % — ABNORMAL HIGH (ref 11.5–15.5)
WBC: 10.9 10*3/uL — ABNORMAL HIGH (ref 4.0–10.5)
nRBC: 0.6 % — ABNORMAL HIGH (ref 0.0–0.2)

## 2021-06-25 LAB — COMPREHENSIVE METABOLIC PANEL
ALT: 8 U/L (ref 0–44)
AST: 20 U/L (ref 15–41)
Albumin: 2.6 g/dL — ABNORMAL LOW (ref 3.5–5.0)
Alkaline Phosphatase: 46 U/L (ref 38–126)
Anion gap: 15 (ref 5–15)
BUN: 39 mg/dL — ABNORMAL HIGH (ref 6–20)
CO2: 24 mmol/L (ref 22–32)
Calcium: 8.6 mg/dL — ABNORMAL LOW (ref 8.9–10.3)
Chloride: 94 mmol/L — ABNORMAL LOW (ref 98–111)
Creatinine, Ser: 5.5 mg/dL — ABNORMAL HIGH (ref 0.61–1.24)
GFR, Estimated: 11 mL/min — ABNORMAL LOW (ref >60)
Glucose, Bld: 138 mg/dL — ABNORMAL HIGH (ref 70–99)
Potassium: 4 mmol/L (ref 3.5–5.1)
Sodium: 133 mmol/L — ABNORMAL LOW (ref 135–145)
Total Bilirubin: 0.8 mg/dL (ref 0.3–1.2)
Total Protein: 6.1 g/dL — ABNORMAL LOW (ref 6.5–8.1)

## 2021-06-25 LAB — GLUCOSE, CAPILLARY
Glucose-Capillary: 100 mg/dL — ABNORMAL HIGH (ref 70–99)
Glucose-Capillary: 87 mg/dL (ref 70–99)

## 2021-06-25 LAB — PHOSPHORUS: Phosphorus: 5 mg/dL — ABNORMAL HIGH (ref 2.5–4.6)

## 2021-06-25 LAB — MAGNESIUM: Magnesium: 1.7 mg/dL (ref 1.7–2.4)

## 2021-06-25 MED ORDER — APIXABAN 2.5 MG PO TABS: 2.5000 mg | ORAL_TABLET | Freq: Two times a day (BID) | ORAL | 5 refills | Status: DC

## 2021-06-25 MED ORDER — PNEUMOCOCCAL 20-VAL CONJ VACC 0.5 ML IM SUSY
0.5000 mL | PREFILLED_SYRINGE | INTRAMUSCULAR | Status: AC
  Administered 2021-06-26: 0.5 mL via INTRAMUSCULAR
  Filled 2021-06-25: qty 0.5

## 2021-06-25 MED ORDER — CHLORHEXIDINE GLUCONATE CLOTH 2 % EX PADS
6.0000 | MEDICATED_PAD | Freq: Every day | CUTANEOUS | Status: DC
  Administered 2021-06-26: 6 via TOPICAL

## 2021-06-25 NOTE — Progress Notes (Signed)
PROGRESS NOTE    Nathaniel White  ZTI:[id number] DOB: Aug 05, 1955 DOA: 06/20/2021 PCP: Nathaniel Pounds, NP  Chief Complaint  Patient presents with   Shortness of Breath   Vascular Access Problem    Brief Narrative:  61 yo with hx ESRD on HD, HFrEF, chronic restrictive lung disease, hx tobacco abuse, and multiple other medical issues who presented with increasing cough and shortness of breath, resulting in bleeding from his dialysis access.  He was admitted to vascular service for left arm brachiocephalic fistula ligation and L IJ 23 cm tunneled dialysis catheter placement.  TRH picked him up on 6/14 and he was noted to have persistent cough, shortness of breath, and new hypoxia.  CT showed findings concerning for necrotizing pneumonia.  Pulmonology consulted.  He's gradually improved with antibiotics and steroids.  Blood counts notable for anemia (relatively stable after initial downtrend) and thrombocytopenia.  Plan for discharge on abx and steroids with outpatient follow up with pulmonology, renal, PCP, vascular, and cardiology.  Eliquis on hold due to thrombocytopenia.   See below for additional details  See below for additional details    Assessment & Plan:   Principal Problem:   Necrotizing pneumonia ([place]) Active Problems:   Hypoglycemia   Acute respiratory failure with hypoxia ([place])   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 (HCC)   ESRD (end stage renal disease) (HCC)   Confusion   Restrictive lung disease   HFrEF (heart failure with reduced ejection fraction) (HCC)   Paroxysmal atrial fibrillation (HCC)   Type 2 diabetes mellitus (HCC)   CVA (cerebral vascular accident) ([place])   Chronic hypotension   Abnormal CT scan   Anemia   Thrombocytopenia (HCC)   CAD (coronary artery disease)   Aortic atherosclerosis (HCC)   Cirrhosis (HCC)   Assessment and Plan: * Necrotizing pneumonia ([place]) Negative MRSA PCR Respiratory culture with normal  resp flora Afebrile, elevated wbc in setting of steroids CT as above with findings concerning for necrotizing pneumonia Pulmonology c/s in setting of necrotizing pneumonia  Urine strep, legionella (he may not be able to give urine sample) MRSA PCR negative Blood cultures NGx4 Continue IV abx inpatient, will discharge with augmentin and doxycycline as well as Nathaniel White few more days of steroids Needs repeat imaging in about 6 weeks to ensure resolution of opacity   Hypoglycemia He was symptomatic when this occurred, but this has not been recurrent A1c 5.4, he didn't receive any insulin or hypoglycemics  Unclear what put him at risk for hypoglycemia night prior to last Follow outpatient, workup further if recurrent  Acute respiratory failure with hypoxia ([place]) Related to necrotizing pneumonia below +/- hx chronic restrictive lung disease Ct with patchy airspace consolidation in superior segment of LLL concerning for necrotizing pneumonia Volume per renal - they want to do an extra dialysis session due to concern for overload starting Monday, dispo pending when they think he's more ready abx as below Steroids per pulm Nebs per pulm Hypoxia resolved  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 (HCC) S/p L arm brachiocephalic fistula ligation and L IJ 23 cm tunneled dialysis catheter placement 6/13 Plan for permanent access outpatient Staples in place, per vascular, follow in 3-4 weeks  Confusion Mild confusion today, doesn't seem too far from his baseline.  Will continue to monitor for now. Follow ammonia with cirrhosis - wnl Discussed with family at bedside today, he's at baseline  ESRD (end stage renal disease) ([place]) TTS dialysis Midodrine TID Volume/lytes per renal  Restrictive lung disease Needs pulm follow up outpatient advair at discharge  Paroxysmal atrial fibrillation (HCC) eliquis (hold if platelets <50,000)  HFrEF (heart failure with reduced  ejection fraction) (HCC) EF 35%, RVSF moderately reduced, severely elevated PASP Volume per renal    Type 2 diabetes mellitus (HCC) A1c 5.4 03/2021 and 10 years prior  Unclear history, would follow outpatient  Chronic hypotension midodrine  CVA (cerebral vascular accident) ([place]) Hx CVA L sided weakness residual  Abnormal CT scan CT abd/pelvis for abdominal discomfort without revealing cause, but notable for unusual appearance of kidneys with innumerable lesions (see report) - possibility of aggressive renal neoplasms is not excluded -> further evaluation with non emergent abdominal MRI recommended (he can't get gadolinium with ESRD) Needs outpatient follow up  Thrombocytopenia ([place]) eliquis  Can resume cautiously at that point Hematology referral, needs to follow with cardiology as well  Anemia Relatively stable around 8 over past few days downtrended with initial bleeding Follow outpatient  Cirrhosis ([place]) Follow with GI outpatient, this maybe cause of thrombocytopenia      DVT prophylaxis: eliquis Code Status: full Family Communication: none Disposition:   Status is: Inpatient Remains inpatient appropriate because: IV abx, watching BG another 24 hrs   Consultants:  Renal Vascular PCCM  Procedures:  6/13 Left arm brachiocephalic fistula ligation Left IJ 23 cm tunneled dialysis catheter placement   Antimicrobials:  Anti-infectives (From admission, onward)    Start     Dose/Rate Route Frequency Ordered Stop   06/24/21 2000  Ampicillin-Sulbactam (UNASYN) 3 g in sodium chloride 0.9 % 100 mL IVPB        3 g 200 mL/hr over 30 Minutes Intravenous Every 24 hours 06/24/21 1756     06/23/21 0000  doxycycline (VIBRA-TABS) 100 MG tablet        100 mg Oral Every 12 hours 06/23/21 1516 06/28/21 2359   06/23/21 0000  amoxicillin-clavulanate (AUGMENTIN) 500-125 MG tablet        500 mg Oral Daily 06/23/21 1516 06/28/21 2359   06/22/21 1800  ceFEPIme (MAXIPIME) 2 g in  sodium chloride 0.9 % 100 mL IVPB  Status:  Discontinued        2 g 200 mL/hr over 30 Minutes Intravenous Every T-Th-Sa (Hemodialysis) 06/21/21 1209 06/21/21 1314   06/22/21 1200  vancomycin (VANCOREADY) IVPB 750 mg/150 mL  Status:  Discontinued        750 mg 150 mL/hr over 60 Minutes Intravenous Every T-Th-Sa (Hemodialysis) 06/21/21 1209 06/22/21 1314   06/21/21 1800  piperacillin-tazobactam (ZOSYN) IVPB 2.25 g  Status:  Discontinued        2.25 g 100 mL/hr over 30 Minutes Intravenous Every 8 hours 06/21/21 1314 06/24/21 1751   06/21/21 1515  doxycycline (VIBRA-TABS) tablet 100 mg        100 mg Oral Every 12 hours 06/21/21 1415     06/21/21 1300  vancomycin (VANCOREADY) IVPB 1750 mg/350 mL        1,750 mg 175 mL/hr over 120 Minutes Intravenous STAT 06/21/21 1205 06/21/21 2202   06/21/21 1300  ceFEPIme (MAXIPIME) 2 g in sodium chloride 0.9 % 100 mL IVPB        2 g 200 mL/hr over 30 Minutes Intravenous STAT 06/21/21 1205 06/21/21 1338   06/20/21 0945  ceFAZolin (ANCEF) IVPB 2g/100 mL premix  Status:  Discontinued        2 g 200 mL/hr over 30 Minutes Intravenous Every 8 hours 06/20/21 0942 06/20/21 7209  Subjective: No new complaints - discussed discharge with him and family (after review of notes, renal wants to keep for extra dialysis for concern for overload -> discussed with pt)  Objective: Vitals:   06/25/21 0420 06/25/21 0824 06/25/21 0859 06/25/21 1225  BP: 103/62  112/73 125/60  Pulse: 80  73 86  Resp: '20  20 18  '$ Temp: 98.6 F (37 C)  98.6 F (37 C) 98.5 F (36.9 C)  TempSrc: Oral  Oral Oral  SpO2: 99% 96% 91% 99%  Weight: 78.3 kg     Height:        Intake/Output Summary (Last 24 hours) at 06/25/2021 1507 Last data filed at 06/25/2021 0900 Gross per 24 hour  Intake 630 ml  Output --  Net 630 ml   Filed Weights   06/24/21 1306 06/24/21 1713 06/25/21 0420  Weight: 82.5 kg 77.1 kg 78.3 kg    Examination:  General: No acute distress. Cardiovascular:  RRR Lungs: unlabored, frequent cough Abdomen: Soft, nontender, nondistended  Neurological: Alert and oriented 2 (doesn't know month, family thinks this is normal). Moves all extremities 4 with equal strength. Cranial nerves II through XII grossly intact. Extremities: No clubbing or cyanosis. No edema.   Data Reviewed: I have personally reviewed following labs and imaging studies  CBC: Recent Labs  Lab 06/21/21 0415 06/21/21 0930 06/22/21 0501 06/23/21 0236 06/24/21 0751 06/25/21 0235  WBC 7.1  --  13.0* 11.5* 11.2* 10.9*  NEUTROABS  --   --  11.8* 10.4* 9.3* 9.3*  HGB 8.2* 8.4* 8.7* 8.0* 8.6* 8.3*  HCT 24.5* 26.0* 26.0* 25.2* 26.7* 25.2*  MCV 93.2  --  95.9 95.1 94.3 92.0  PLT 51*  --  40* 47* 58* 65*    Basic Metabolic Panel: Recent Labs  Lab 06/20/21 2004 06/21/21 0415 06/21/21 0930 06/22/21 0501 06/23/21 0236 06/24/21 0751 06/25/21 0235  NA 135 136  --  129* 136 135 133*  K 3.8 4.5  --  5.7* 6.1* 5.0 4.0  CL 97* 98  --  93* 93* 94* 94*  CO2 26 26  --  21* 21* 20* 24  GLUCOSE 155* 144*  --  114* 99 75 138*  BUN 39* 45*  --  68* 93* 59* 39*  CREATININE 6.04* 6.86*  --  9.36* 10.55* 7.41* 5.50*  CALCIUM 8.4* 8.2*  --  8.2* 8.9 8.6* 8.6*  MG 1.7  --   --  2.1 2.4  --  1.7  PHOS  --   --  6.9* 8.1* 10.0*  --  5.0*    GFR: Estimated Creatinine Clearance: 14.6 mL/min (Dierdre Mccalip) (by C-G formula based on SCr of 5.5 mg/dL (H)).  Liver Function Tests: Recent Labs  Lab 06/20/21 1024 06/21/21 0930 06/22/21 0501 06/23/21 0236 06/25/21 0235  AST '15 20 27 21 20  '$ ALT '9 10 8 6 8  '$ ALKPHOS 39 38 35* 37* 46  BILITOT 1.7* 0.9 1.1 1.1 0.8  PROT 5.2* 5.8* 5.7* 6.1* 6.1*  ALBUMIN 2.7* 2.8* 2.7* 2.6* 2.6*    CBG: Recent Labs  Lab 06/24/21 0645 06/24/21 1228 06/24/21 1817 06/25/21 0031 06/25/21 1231  GLUCAP 74 100* 84 87 100*     Recent Results (from the past 240 hour(s))  Resp Panel by RT-PCR (Flu Nathaniel White&B, Covid) Anterior Nasal Swab     Status: None   Collection Time:  06/20/21  6:13 AM   Specimen: Anterior Nasal Swab  Result Value Ref Range Status   SARS Coronavirus 2 by RT  PCR NEGATIVE NEGATIVE Final    Comment: (NOTE) SARS-CoV-2 target nucleic acids are NOT DETECTED.  The SARS-CoV-2 RNA is generally detectable in upper respiratory specimens during the acute phase of infection. The lowest concentration of SARS-CoV-2 viral copies this assay can detect is 138 copies/mL. Nathaniel White negative result does not preclude SARS-Cov-2 infection and should not be used as the sole basis for treatment or other patient management decisions. Nathaniel White negative result may occur with  improper specimen collection/handling, submission of specimen other than nasopharyngeal swab, presence of viral mutation(s) within the areas targeted by this assay, and inadequate number of viral copies(<138 copies/mL). Nathaniel White negative result must be combined with clinical observations, patient history, and epidemiological information. The expected result is Negative.  Fact Sheet for Patients:  EntrepreneurPulse.com.au  Fact Sheet for Healthcare Providers:  IncredibleEmployment.be  This test is no t yet approved or cleared by the [place] FDA and  has been authorized for detection and/or diagnosis of SARS-CoV-2 by FDA under an Emergency Use Authorization (EUA). This EUA will remain  in effect (meaning this test can be used) for the duration of the COVID-19 declaration under Section 564(b)(1) of the Act, 21 U.S.C.section 360bbb-3(b)(1), unless the authorization is terminated  or revoked sooner.       Influenza Avionna Bower by PCR NEGATIVE NEGATIVE Final   Influenza B by PCR NEGATIVE NEGATIVE Final    Comment: (NOTE) The Xpert Xpress SARS-CoV-2/FLU/RSV plus assay is intended as an aid in the diagnosis of influenza from Nasopharyngeal swab specimens and should not be used as Nathaniel White sole basis for treatment. Nasal washings and aspirates are unacceptable for Xpert Xpress  SARS-CoV-2/FLU/RSV testing.  Fact Sheet for Patients: EntrepreneurPulse.com.au  Fact Sheet for Healthcare Providers: IncredibleEmployment.be  This test is not yet approved or cleared by the [place] FDA and has been authorized for detection and/or diagnosis of SARS-CoV-2 by FDA under an Emergency Use Authorization (EUA). This EUA will remain in effect (meaning this test can be used) for the duration of the COVID-19 declaration under Section 564(b)(1) of the Act, 21 U.S.C. section 360bbb-3(b)(1), unless the authorization is terminated or revoked.  Performed at [facility], [place] [address], [place], [place] [address]   MRSA Next Gen by PCR, Nasal     Status: None   Collection Time: 06/20/21  8:39 PM   Specimen: Nasal Mucosa; Nasal Swab  Result Value Ref Range Status   MRSA by PCR Next Gen NOT DETECTED NOT DETECTED Final    Comment: (NOTE) The GeneXpert MRSA Assay (FDA approved for NASAL specimens only), is one component of Nathaniel White comprehensive MRSA colonization surveillance program. It is not intended to diagnose MRSA infection nor to guide or monitor treatment for MRSA infections. Test performance is not FDA approved in patients less than 31 years old. Performed at [facility], [place] [address], [place], [place] [address]   Culture, blood (Routine X 2) w Reflex to ID Panel     Status: None (Preliminary result)   Collection Time: 06/21/21 12:51 PM   Specimen: BLOOD RIGHT ARM  Result Value Ref Range Status   Specimen Description BLOOD RIGHT ARM UPPER  Final   Special Requests   Final    BOTTLES DRAWN AEROBIC AND ANAEROBIC Blood Culture adequate volume   Culture   Final    NO GROWTH 4 DAYS Performed at [facility], [place] [address], [place], [place] [address]    Report Status PENDING  Incomplete  Culture, blood (Routine X 2)  w Reflex to ID Panel     Status: None (Preliminary result)   Collection Time: 06/21/21  1:03 PM    Specimen: BLOOD RIGHT ARM  Result Value Ref Range Status   Specimen Description BLOOD RIGHT ARM UPPER  Final   Special Requests   Final    BOTTLES DRAWN AEROBIC AND ANAEROBIC Blood Culture adequate volume   Culture   Final    NO GROWTH 4 DAYS Performed at [facility], [address] [place], [place] [address]    Report Status PENDING  Incomplete  Culture, Respiratory w Gram Stain     Status: None   Collection Time: 06/21/21  5:21 PM   Specimen: Tracheal Aspirate; Respiratory  Result Value Ref Range Status   Specimen Description TRACHEAL ASPIRATE  Final   Special Requests NONE  Final   Gram Stain   Final    ABUNDANT SQUAMOUS EPITHELIAL CELLS PRESENT ABUNDANT WBC PRESENT, PREDOMINANTLY PMN RARE GRAM POSITIVE COCCI RARE GRAM NEGATIVE RODS    Culture   Final    RARE Normal respiratory flora-no Staph aureus or Pseudomonas seen Performed at [facility], [place] [address], [place], [place] [address]    Report Status 06/24/2021 FINAL  Final         Radiology Studies: No results found.      Scheduled Meds:  apixaban  2.5 mg Oral BID   arformoterol  15 mcg Nebulization BID   Chlorhexidine Gluconate Cloth  6 each Topical Q0600   [START ON 06/26/2021] Chlorhexidine Gluconate Cloth  6 each Topical Q0600   docusate sodium  100 mg Oral BID   doxercalciferol  5 mcg Intravenous Q T,Th,Sa-HD   doxycycline  100 mg Oral Q12H   methylPREDNISolone (SOLU-MEDROL) injection  40 mg Intravenous Daily   midodrine  10 mg Oral TID WC   montelukast  10 mg Oral QHS   pantoprazole  40 mg Oral Daily   pneumococcal 20-valent conjugate vaccine  0.5 mL Intramuscular Tomorrow-1000   revefenacin  175 mcg Nebulization Daily   sucroferric oxyhydroxide  1,000 mg Oral BID PC   Continuous Infusions:  ampicillin-sulbactam (UNASYN) IV Stopped (06/24/21 2144)     LOS: 5 days    Time spent: over 30 min    Fayrene Helper, MD [facility]   To contact the attending provider  between 7A-7P or the covering provider during after hours 7P-7A, please log into the web site [email or web address] and access using universal  password for that web site. If you do not have the password, please call the hospital operator.  06/25/2021, 3:07 PM

## 2021-06-25 NOTE — Progress Notes (Signed)
Mobility Specialist: Progress Note   06/25/21 1437  Mobility  Activity Ambulated with assistance in hallway  Level of Assistance Minimal assist, patient does 75% or more  Assistive Device Front wheel walker  Distance Ambulated (ft) 30 ft  Activity Response Tolerated well  $Mobility charge 1 Mobility   Pre-Mobility: 85 HR, 96% SpO2  Pt received in the chair and agreeable to ambulation. Distance limited secondary to fatigue. Attempted to complete walking saturation test but unable to get reliable SpO2 reading during session. Pt sitting EOB after mobility with call bell at his side and family present in the room.   [facility] Jenniferlynn Saad Mobility Specialist Mobility Specialist [address]: [phone]

## 2021-06-25 NOTE — Progress Notes (Signed)
D/C order cancelled on 6/16. Pt with necrotizing pneumonia undergoing IV abx. Watching BG due to hypoglycemia. Will continue to f/u to assist with the D/C plan.

## 2021-06-25 NOTE — Progress Notes (Signed)
[facility] Progress Note  Subjective: seen in room. 3.5 L off w/ HD yesterday. Still SOB.   Vitals:   06/24/21 2110 06/25/21 0420 06/25/21 0824 06/25/21 0859  BP:  103/62  112/73  Pulse: 79 80  73  Resp:  20  20  Temp: 98.7 F (37.1 C) 98.6 F (37 C)  98.6 F (37 C)  TempSrc: Oral Oral  Oral  SpO2: 100% 99% 96% 91%  Weight:  78.3 kg    Height:        Exam: Gen alert, nasal O2, no distress No jvd or bruits Chest bilat crackles 1/3 up RRR no RG Abd soft ntnd no mass or ascites +bs Ext +1-2 edema bilat  Neuro is alert, Ox 3 , nf    LUA AVF no bruit, staples in place, wound is clean    new IJ [facility]         Home meds include - acetminophen, albuterol, apixaban, dialyvite, docusate, fluticasone-salmeterol, hydroxyzine, midodrine 10 tid, montelukast, velphoro 1 gm ac     CXR 6/13 - IMPRESSION: Widespread patchy areas of interstitial prominence, peribronchial cuffing, patchy ill-defined opacities and areas of architectural distortion are again noted throughout the lungs bilaterally, most evident in the mid to lower lungs (right-greater-than-left) with areas of chronic pleuroparenchymal scarring and architectural distortion in the right mid to lower lung, similar to prior examinations.    OP HD: SW TTS 4h  450/500  75.5kg   2/2 bath P2  TDC (AVF ligated, new TDC)   Hep none - last HD 6/10, 80.7 > 77.7kg - doxercalciferol 5 ug tiw IV - etecalcetide 7.5 mg tiw IV - no esa     Assessment/ Plan: Bleeding AVF - sp AVF ligation by [facility] 6/13. New TDC placed. Next permanent access to be planned in OP setting per [facility].  ESRD - on HD TTS. Needs extra/ serial HD for vol overload. HD tomorrow.  Resp - +rales and suspect pulm edema on back on background CXR changes. 3.5 L off w/ HD yest, still LE edema/ rales, do extra HD Monday and serial HD this week until we can get lungs dried out.  BP/ volume - on midodrine 10 tid. +LE edema, worried he is wet and may need dry wt lowered.  As above.  Anemia esrd + abl - transfuse prn. Not on esa at op unit.  MBD ckd - CCa in range, phos 8s. Cont velphoro as binder, cont vdra IV.        Nathaniel White 06/25/2021, 10:55 AM   Recent Labs  Lab 06/23/21 0236 06/24/21 0751 06/25/21 0235  HGB 8.0* 8.6* 8.3*  ALBUMIN 2.6*  --  2.6*  CALCIUM 8.9 8.6* 8.6*  PHOS 10.0*  --  5.0*  CREATININE 10.55* 7.41* 5.50*  K 6.1* 5.0 4.0    Inpatient medications:  apixaban  2.5 mg Oral BID   arformoterol  15 mcg Nebulization BID   Chlorhexidine Gluconate Cloth  6 each Topical Q0600   docusate sodium  100 mg Oral BID   doxercalciferol  5 mcg Intravenous Q T,Th,Sa-HD   doxycycline  100 mg Oral Q12H   methylPREDNISolone (SOLU-MEDROL) injection  40 mg Intravenous Daily   midodrine  10 mg Oral TID WC   montelukast  10 mg Oral QHS   pantoprazole  40 mg Oral Daily   pneumococcal 20-valent conjugate vaccine  0.5 mL Intramuscular Tomorrow-1000   revefenacin  175 mcg Nebulization Daily   sucroferric oxyhydroxide  1,000 mg Oral BID  PC    ampicillin-sulbactam (UNASYN) IV Stopped (06/24/21 2144)   acetaminophen **OR** acetaminophen, albuterol, alum & mag hydroxide-simeth, HYDROmorphone (DILAUDID) injection, labetalol, metoprolol tartrate, ondansetron, mouth rinse, oxyCODONE-acetaminophen, phenol, senna-docusate

## 2021-06-26 ENCOUNTER — Inpatient Hospital Stay: Payer: Medicare Other | Attending: Hematology | Admitting: Hematology

## 2021-06-26 DIAGNOSIS — I4729 Other ventricular tachycardia: Secondary | ICD-10-CM

## 2021-06-26 DIAGNOSIS — J850 Gangrene and necrosis of lung: Secondary | ICD-10-CM | POA: Diagnosis not present

## 2021-06-26 LAB — COMPREHENSIVE METABOLIC PANEL
ALT: 7 U/L (ref 0–44)
AST: 15 U/L (ref 15–41)
Albumin: 2.6 g/dL — ABNORMAL LOW (ref 3.5–5.0)
Alkaline Phosphatase: 46 U/L (ref 38–126)
Anion gap: 18 — ABNORMAL HIGH (ref 5–15)
BUN: 67 mg/dL — ABNORMAL HIGH (ref 6–20)
CO2: 24 mmol/L (ref 22–32)
Calcium: 9.2 mg/dL (ref 8.9–10.3)
Chloride: 94 mmol/L — ABNORMAL LOW (ref 98–111)
Creatinine, Ser: 7.97 mg/dL — ABNORMAL HIGH (ref 0.61–1.24)
GFR, Estimated: 7 mL/min — ABNORMAL LOW (ref >60)
Glucose, Bld: 122 mg/dL — ABNORMAL HIGH (ref 70–99)
Potassium: 4.1 mmol/L (ref 3.5–5.1)
Sodium: 136 mmol/L (ref 135–145)
Total Bilirubin: 0.8 mg/dL (ref 0.3–1.2)
Total Protein: 6.6 g/dL (ref 6.5–8.1)

## 2021-06-26 LAB — CBC WITH DIFFERENTIAL/PLATELET
Abs Immature Granulocytes: 0.04 10*3/uL (ref 0.00–0.07)
Basophils Absolute: 0 10*3/uL (ref 0.0–0.1)
Basophils Relative: 0 %
Eosinophils Absolute: 0 10*3/uL (ref 0.0–0.5)
Eosinophils Relative: 0 %
HCT: 25.4 % — ABNORMAL LOW (ref 39.0–52.0)
Hemoglobin: 8.4 g/dL — ABNORMAL LOW (ref 13.0–17.0)
Immature Granulocytes: 0 %
Lymphocytes Relative: 5 %
Lymphs Abs: 0.5 10*3/uL — ABNORMAL LOW (ref 0.7–4.0)
MCH: 30.2 pg (ref 26.0–34.0)
MCHC: 33.1 g/dL (ref 30.0–36.0)
MCV: 91.4 fL (ref 80.0–100.0)
Monocytes Absolute: 0.9 10*3/uL (ref 0.1–1.0)
Monocytes Relative: 9 %
Neutro Abs: 8.3 10*3/uL — ABNORMAL HIGH (ref 1.7–7.7)
Neutrophils Relative %: 86 %
Platelets: 81 10*3/uL — ABNORMAL LOW (ref 150–400)
RBC: 2.78 MIL/uL — ABNORMAL LOW (ref 4.22–5.81)
RDW: 16.7 % — ABNORMAL HIGH (ref 11.5–15.5)
WBC: 9.7 10*3/uL (ref 4.0–10.5)
nRBC: 0.4 % — ABNORMAL HIGH (ref 0.0–0.2)

## 2021-06-26 LAB — CULTURE, BLOOD (ROUTINE X 2)
Culture: NO GROWTH
Culture: NO GROWTH
Special Requests: ADEQUATE
Special Requests: ADEQUATE

## 2021-06-26 LAB — MAGNESIUM: Magnesium: 2 mg/dL (ref 1.7–2.4)

## 2021-06-26 LAB — PHOSPHORUS: Phosphorus: 7.7 mg/dL — ABNORMAL HIGH (ref 2.5–4.6)

## 2021-06-26 LAB — GLUCOSE, CAPILLARY: Glucose-Capillary: 140 mg/dL — ABNORMAL HIGH (ref 70–99)

## 2021-06-26 MED ORDER — HEPARIN SODIUM (PORCINE) 1000 UNIT/ML IJ SOLN
INTRAMUSCULAR | Status: AC
  Administered 2021-06-26: 4000 [IU]
  Filled 2021-06-26: qty 4

## 2021-06-26 MED ORDER — DARBEPOETIN ALFA 60 MCG/0.3ML IJ SOSY
60.0000 ug | PREFILLED_SYRINGE | Freq: Once | INTRAMUSCULAR | Status: AC
  Administered 2021-06-26: 60 ug via INTRAVENOUS
  Filled 2021-06-26: qty 0.3

## 2021-06-26 MED ORDER — CHLORHEXIDINE GLUCONATE CLOTH 2 % EX PADS: 6.0000 | MEDICATED_PAD | Freq: Every day | CUTANEOUS | Status: DC

## 2021-06-26 NOTE — Progress Notes (Signed)
Order received to discharge patient.  Telemetry monitor removed and CCMD notified.  PIV access removed without difficulty.  DC meds from [place] delivered to patient prior to his departure.  Discharge instructions, follow up, medications and instructions for their use discussed with patient and patient's son at bedside.

## 2021-06-26 NOTE — Progress Notes (Signed)
D/C order noted. Contacted [facility] SW to advise clinic of pt's d/c today and that pt will resume care tomorrow.   Kiarrah Rausch Renal Navigator [phone] 

## 2021-06-26 NOTE — Progress Notes (Addendum)
Pt with run of vtach. This rn arrived to room and aroused pt from sleep, pt denied cp sob dizziness or palpitations. Paged Dr Royce Macadamia to inform of pt run of vtach. Md unable to confirm accuracy of documented hr readings, md will hold on starting beta blocker at this time. Dr Florene Glen to bedside to round, informed md of pt run of vtach and last magnesium level 2.0, md reviewed cardiac monitor event alarm, md states no need for lab draw post HD session at this time.

## 2021-06-26 NOTE — Progress Notes (Signed)
[facility] Progress Note  Subjective:   Last HD on 6/17 with 3.5 kg UF.  He states he feels better after getting fluid off and is glad to do the extra HD treatment today -they are about to bring him up now.   Review of systems:  Reports a little shortness of breath denies chest pain Denies n/v  Vitals:   06/25/21 2300 06/26/21 0631 06/26/21 0812 06/26/21 0845  BP: 135/77 116/85 126/81   Pulse: 79 80 84   Resp: 20 20    Temp: (!) 97.5 F (36.4 C) 98.6 F (37 C) 98.5 F (36.9 C)   TempSrc: Oral Oral Oral   SpO2: 96% 96%  95%  Weight:      Height:        Exam:  General adult male in bed in no acute distress HEENT normocephalic atraumatic extraocular movements intact sclera anicteric Neck supple trachea midline Lungs clear but decreased to auscultation bilaterally normal work of breathing at rest on room air; lying flat on my exam for transport but states this is a little harder  Heart S1S2 no rub Abdomen soft nontender nondistended Extremities 1-2+ edema  Psych normal mood and affect Left IJ tunn catheter; LUE AVF without bruit and staples in place        Home meds include - acetminophen, albuterol, apixaban, dialyvite, docusate, fluticasone-salmeterol, hydroxyzine, midodrine 10 tid, montelukast, velphoro 1 gm ac     CXR 6/13 - IMPRESSION: Widespread patchy areas of interstitial prominence, peribronchial cuffing, patchy ill-defined opacities and areas of architectural distortion are again noted throughout the lungs bilaterally, most evident in the mid to lower lungs (right-greater-than-left) with areas of chronic pleuroparenchymal scarring and architectural distortion in the right mid to lower lung, similar to prior examinations.    OP HD: SW TTS 4h  450/500  75.5kg   2/2 bath P2  TDC (AVF ligated, new TDC)   Hep none - last HD 6/10, 80.7 > 77.7kg - doxercalciferol 5 ug tiw IV - etecalcetide 7.5 mg tiw IV - no esa     Assessment/ Plan: Bleeding AVF - sp  AVF ligation by [facility] 6/13. New TDC placed. Next permanent access to be planned in OP setting per [facility].  ESRD - on HD TTS. Getting HD today for an extra tx to optimize volume status then resume TTS schedule.  Acute hypoxic respiratory failure -suspected due to edema. additional HD today   Chronic hypotension- on midodrine 10 tid. +LE edema, he may need dry wt lowered. As above.  Anemia esrd + as well as acute blood loss with bleeding AVF - transfuse prn. Not on esa at op unit. Aranesp 60 mcg once ordered for 6/19 and anticipate need for ESA outpatient as well  MBD ckd - hyperphosphatemia. Cont velphoro as binder. On hectorol   Disposition - per primary team.  They state he is stable for discharge after HD today from their standpoint.  PT recommends home health PT    Recent Labs  Lab 06/25/21 0235 06/26/21 0242  HGB 8.3* 8.4*  ALBUMIN 2.6* 2.6*  CALCIUM 8.6* 9.2  PHOS 5.0* 7.7*  CREATININE 5.50* 7.97*  K 4.0 4.1   Inpatient medications:  apixaban  2.5 mg Oral BID   arformoterol  15 mcg Nebulization BID   Chlorhexidine Gluconate Cloth  6 each Topical Q0600   Chlorhexidine Gluconate Cloth  6 each Topical Q0600   docusate sodium  100 mg Oral BID   doxercalciferol  5 mcg Intravenous Q  T,Th,Sa-HD   doxycycline  100 mg Oral Q12H   methylPREDNISolone (SOLU-MEDROL) injection  40 mg Intravenous Daily   midodrine  10 mg Oral TID WC   montelukast  10 mg Oral QHS   pantoprazole  40 mg Oral Daily   pneumococcal 20-valent conjugate vaccine  0.5 mL Intramuscular Tomorrow-1000   revefenacin  175 mcg Nebulization Daily   sucroferric oxyhydroxide  1,000 mg Oral BID PC    ampicillin-sulbactam (UNASYN) IV 200 mL/hr at 06/26/21 0881   acetaminophen **OR** acetaminophen, albuterol, alum & mag hydroxide-simeth, HYDROmorphone (DILAUDID) injection, labetalol, metoprolol tartrate, ondansetron, mouth rinse, oxyCODONE-acetaminophen, phenol, senna-docusate   Claudia Desanctis, MD 06/26/2021 9:26 AM

## 2021-06-26 NOTE — Progress Notes (Signed)
Physical Therapy Treatment Patient Details Name: Nathaniel White MRN: [id number] DOB: 1960-08-11 Today's Date: 06/26/2021   History of Present Illness Pt is a 61 y.o. male admitted 06/20/21 with LUE AVF bleeding after coughing spell. S/p LUE AVF ligation, LIJ tunneled HD cath placement 6/13. PMH includes stroke (residual L side weakness), ESRD (HD TTS), HTN, PVD, COPD, CHF, asthma, anxiety.    PT Comments    Pt received sidelying on R and agreeable to mobility. Pt requesting rollator for OOB mobility this session. Pt with poor safety awareness, reaching for rollator with RUE to stand despite max cues for safe hand placement. With time pt able to come to standing x3 throughout session with min assist to steady and maintain statically without assist. Encouraged pt to side step to [facility] as HD transport arriving during session, pt unable to demonstrate side step and coming to sit EOB and scooting toward [facility] before returning to supine. Pt continues to benefit from skilled PT services to progress toward functional mobility goals.    Recommendations for follow up therapy are one component of a multi-disciplinary discharge planning process, led by the attending physician.  Recommendations may be updated based on patient status, additional functional criteria and insurance authorization.  Follow Up Recommendations  Home health PT     Assistance Recommended at Discharge Intermittent Supervision/Assistance  Patient can return home with the following A little help with walking and/or transfers;A little help with bathing/dressing/bathroom;Assistance with cooking/housework;Assist for transportation;Help with stairs or ramp for entrance   Equipment Recommendations  None recommended by PT    Recommendations for Other Services       Precautions / Restrictions Precautions Precautions: Fall;Other (comment) Precaution Comments: h/o CVA with L-side residual weakness; bowel incontinence Restrictions Weight  Bearing Restrictions: Yes LUE Weight Bearing: Weight bearing as tolerated LLE Weight Bearing: Weight bearing as tolerated     Mobility  Bed Mobility Overal bed mobility: Needs Assistance Bed Mobility: Supine to Sit   Sidelying to sit: Min assist, HOB elevated       General bed mobility comments: recieved propped sitting on R, min asssit to come to sitting upright    Transfers Overall transfer level: Needs assistance Equipment used: None, Rollator (4 wheels) Transfers: Sit to/from Stand, Bed to chair/wheelchair/BSC Sit to Stand: Min guard, Min assist           General transfer comment: pt reliant on momentum and RUE support to power up from EOB, pt declinging assist able to stand x3, encouraged pt to sidestep toward [facility] but pt sitting abruptly and then scoot toward [facility]    Ambulation/Gait               General Gait Details: gait deferred, HD trasnport present   Stairs             Wheelchair Mobility    Modified Rankin (Stroke Patients Only)       Balance Overall balance assessment: Needs assistance Sitting-balance support: No upper extremity supported, Feet supported Sitting balance-Leahy Scale: Fair     Standing balance support: No upper extremity supported, Single extremity supported Standing balance-Leahy Scale: Fair Standing balance comment: pt can static stand without UE support to perform posterior pericare with RUE, unable to accept significant challenge to balance                            Cognition Arousal/Alertness: Awake/alert Behavior During Therapy: WFL for tasks assessed/performed Overall Cognitive Status: No family/caregiver  present to determine baseline cognitive functioning                                          Exercises      General Comments General comments (skin integrity, edema, etc.): session limited 2/2 dialysis trasnport arrival      Pertinent Vitals/Pain Pain Assessment Pain  Assessment: Faces Faces Pain Scale: Hurts a little bit Pain Location: LUE Pain Descriptors / Indicators: Tender Pain Intervention(s): Monitored during session    Home Living                          Prior Function            PT Goals (current goals can now be found in the care plan section) Acute Rehab PT Goals Patient Stated Goal: return home PT Goal Formulation: With patient Time For Goal Achievement: 07/07/21    Frequency    Min 3X/week      PT Plan Current plan remains appropriate    Co-evaluation              AM-PAC PT "6 Clicks" Mobility   Outcome Measure  Help needed turning from your back to your side while in a flat bed without using bedrails?: A Little Help needed moving from lying on your back to sitting on the side of a flat bed without using bedrails?: A Little Help needed moving to and from a bed to a chair (including a wheelchair)?: A Little Help needed standing up from a chair using your arms (e.g., wheelchair or bedside chair)?: A Little Help needed to walk in hospital room?: A Lot Help needed climbing 3-5 steps with a railing? : A Lot 6 Click Score: 16    End of Session   Activity Tolerance: Patient tolerated treatment well Patient left: in bed;with call bell/phone within reach;Other (comment) (with dialysis transport present) Nurse Communication: Mobility status PT Visit Diagnosis: Other abnormalities of gait and mobility (R26.89);Muscle weakness (generalized) (M62.81)     Time: 2725-3664 PT Time Calculation (min) (ACUTE ONLY): 11 min  Charges:  $Therapeutic Activity: 8-22 mins                     Negar Sieler R. PTA Acute Rehabilitation Services Office: [place] 06/26/2021, 9:22 AM

## 2021-06-26 NOTE — Discharge Summary (Addendum)
Physician Discharge Summary  JAWON DIPIERO TGG:[id number] DOB: Oct 29, 1960 DOA: 06/20/2021  PCP: Gildardo Pounds, NP  Admit date: 06/20/2021 Discharge date: 06/26/2021  Time spent: 40 minutes  Recommendations for Outpatient Follow-up:  Follow outpatient CBC/CMP  Attention to platelets in follow up, would hold eliquis for platelets <50 - continue follow up with cards to discuss appropriateness of continued anticoagulation with intermittent severe thrombocytopenia, follow with heme to further evaluate thrombcytopenia (related to cirrhosis on imaging?) Follow with pulmonary outpatient for necrotizing pneumonia - needs repeat CT scan in 6 weeks Follow with vascular for ligated fistula, permanent access, staple removal  Follow with PCP and/or renal for abnormal kidney imaging  Follow with GI outpatient for cirrhosis on imaging Follow with cardiology for NSVT Hypoglycemia noted, symptomatic x1, follow outpatient if recurrent  Discharge Diagnoses:  Principal Problem:   Necrotizing pneumonia ([place]) Active Problems:   Hypoglycemia   Acute respiratory failure with hypoxia ([place])   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 (HCC)   ESRD (end stage renal disease) ([place])   Confusion   Restrictive lung disease   HFrEF (heart failure with reduced ejection fraction) (HCC)   Paroxysmal atrial fibrillation (HCC)   NSVT (nonsustained ventricular tachycardia) (HCC)   Type 2 diabetes mellitus (HCC)   CVA (cerebral vascular accident) ([place])   Chronic hypotension   Unusual Appearance with Innumerable Lesions on CT scan, needs MRI   Anemia   Thrombocytopenia ([place])   CAD (coronary artery disease)   Aortic atherosclerosis ([place])   Cirrhosis ([place])   Discharge Condition: stable  Diet recommendation: renal  Filed Weights   06/25/21 0420 06/26/21 0930 06/26/21 1420  Weight: 78.3 kg 78.3 kg 76.4 kg    History of present illness:  61 yo with hx ESRD on HD, HFrEF, chronic  restrictive lung disease, hx tobacco abuse, and multiple other medical issues who presented with increasing cough and shortness of breath, resulting in bleeding from his dialysis access.  He was admitted to vascular service for left arm brachiocephalic fistula ligation and L IJ 23 cm tunneled dialysis catheter placement.  TRH picked him up on 6/14 and he was noted to have persistent cough, shortness of breath, and new hypoxia.  CT showed findings concerning for necrotizing pneumonia.  Pulmonology consulted.  He's gradually improved with antibiotics and steroids.  Blood counts notable for anemia (relatively stable after initial downtrend) and thrombocytopenia.  Plan for discharge on abx and steroids with outpatient follow up with pulmonology, renal, PCP, vascular, and cardiology.  Eliquis on hold due to thrombocytopenia.  Hospitalization complicated by hypoglycemia, now resolved.  Also by mild confusion at times (now resolved) and volume overload requiring extra dialysis session 6/19.    See below for additional details  See below for additional details  Hospital Course:  Assessment and Plan: * Necrotizing pneumonia ([place]) Negative MRSA PCR Respiratory culture with normal resp flora Afebrile, elevated wbc in setting of steroids CT as above with findings concerning for necrotizing pneumonia Pulmonology c/s in setting of necrotizing pneumonia  Urine strep, legionella (he may not be able to give urine sample) MRSA PCR negative Blood cultures NGx4 Continue IV abx inpatient, will discharge with augmentin and doxycycline as well as Jaymond Waage few more days of steroids Needs repeat imaging in about 6 weeks to ensure resolution of opacity   Hypoglycemia He was symptomatic when this occurred, but this has not been recurrent A1c 5.4, he didn't receive any insulin or hypoglycemics  Unclear what put him at risk for symptomatic  hypoglycemia, but this has not been recurrent Follow outpatient, w/u as  indicated  Acute respiratory failure with hypoxia ([place]) Related to necrotizing pneumonia below +/- hx chronic restrictive lung disease Ct with patchy airspace consolidation in superior segment of LLL concerning for necrotizing pneumonia Volume per renal - ok for discharge after dialysis per renal abx as below Steroids per pulm Nebs per pulm Hypoxia resolved  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 (HCC) S/p L arm brachiocephalic fistula ligation and L IJ 23 cm tunneled dialysis catheter placement 6/13 Plan for permanent access outpatient Staples in place, per vascular, follow in 3-4 weeks  Confusion Mild confusion today, doesn't seem too far from his baseline.  Will continue to monitor for now. Follow ammonia with cirrhosis - wnl Discussed with family at bedside today, he's at baseline  ESRD (end stage renal disease) ([place]) TTS dialysis Midodrine TID Volume/lytes per renal   Restrictive lung disease Needs pulm follow up outpatient advair at discharge  NSVT (nonsustained ventricular tachycardia) ([place]) Short runs Will hold off on BB with need for midodrine Follow with cards outpatient   Paroxysmal atrial fibrillation ([place]) eliquis (hold if platelets <50,000)  HFrEF (heart failure with reduced ejection fraction) (HCC) EF 35%, RVSF moderately reduced, severely elevated PASP Volume per renal    Type 2 diabetes mellitus (HCC) A1c 5.4 03/2021 and 10 years prior  Unclear history, would follow outpatient  Chronic hypotension midodrine  CVA (cerebral vascular accident) ([place]) Hx CVA L sided weakness residual  Unusual Appearance with Innumerable Lesions on CT scan, needs MRI CT abd/pelvis for abdominal discomfort without revealing cause, but notable for unusual appearance of kidneys with innumerable lesions (see report) - possibility of aggressive renal neoplasms is not excluded -> further evaluation with non emergent abdominal MRI recommended (he  can't get gadolinium with ESRD) Needs outpatient follow up  Thrombocytopenia ([place]) eliquis  Can resume cautiously at that point Hematology referral, needs to follow with cardiology as well  Anemia Relatively stable around 8 over past few days downtrended with initial bleeding Follow outpatient  Cirrhosis ([place]) Follow with GI outpatient, this maybe cause of thrombocytopenia     Procedures: Left arm brachiocephalic fistula ligation Left IJ 23 cm tunneled dialysis catheter placement     Consultations: Vascular PCCM renal  Discharge Exam: Vitals:   06/26/21 1420 06/26/21 1500  BP: 116/60 127/84  Pulse: 62 85  Resp: (!) 21 18  Temp: 98.7 F (37.1 C) 98.3 F (36.8 C)  SpO2: 96%    Seen on dialysis Eager to discharge No new complaints  General: No acute distress. Cardiovascular: RRR Lungs: unlabored Abdomen: Soft, nontender, nondistended  Neurological: Alert and oriented 3. Moves all extremities 4 with equal strength. Cranial nerves II through XII grossly intact. Extremities: No clubbing or cyanosis. No edema.   Discharge Instructions   Discharge Instructions     Ambulatory referral to Gastroenterology   Complete by: As directed    What is the reason for referral?: Other Comment - cirrhosis   Ambulatory referral to Hematology / Oncology   Complete by: As directed    Ambulatory referral to Pulmonology   Complete by: As directed    Reason for referral: Other Comment - pneumonia   Call MD for:  difficulty breathing, headache or visual disturbances   Complete by: As directed    Call MD for:  extreme fatigue   Complete by: As directed    Call MD for:  hives   Complete by: As directed  Call MD for:  persistant dizziness or light-headedness   Complete by: As directed    Call MD for:  persistant nausea and vomiting   Complete by: As directed    Call MD for:  redness, tenderness, or signs of infection (pain, swelling, redness, odor or green/yellow  discharge around incision site)   Complete by: As directed    Call MD for:  severe uncontrolled pain   Complete by: As directed    Call MD for:  temperature >100.4   Complete by: As directed    Diet - low sodium heart healthy   Complete by: As directed    Discharge instructions   Complete by: As directed    You were seen for Aslynn Brunetti bleeding fistula.  This was treated by vascular surgery.   You now have Cailean Heacock tunneled dialysis catheter.  You should follow with vascular in 3-4 weeks.  Follow up with vascular about the removal of your staples.  You had cough and shortness of breath related to necrotizing pneumonia.  We'll treat you with steroids and antibiotics.  You should follow up with the lung doctors as an outpatient.  You'll need Pama Roskos repeat CT scan in about 6 weeks.  Your primary care doctor should follow up on your cultures as an outpatient.  Your platelets are over 50,000.  It's ok to resume your eliquis.  You should hold your eliquis if your platelets fall below 50,000 again.  You should follow up with cardiology outpatient about whether you should continue eliquis long term.  You should also follow up with hematology (blood doctors) about your low platelets.  You had abnormal imaging of your kidneys that needs to be followed with an MRI outpatient.  Please follow this up with your PCP and/or your kidney doctor.  We'll send you to see gastroenterology outpatient for imaging findings concerning for cirrhosis.  You had some abnormal heart rhythms at times that should be followed with cardiology outpatient.  Please follow up with your PCP within [place] week.   Return for new, recurrent, or worsening symptoms.  Please ask your PCP to request records from this hospitalization so they know what was done and what the next steps will be.   Discharge patient   Complete by: As directed    Discharge disposition: 01-Home or Self Care   Discharge patient date: 06/26/2021   Discharge wound care:   Complete  by: As directed    Per vascular surgery   Increase activity slowly   Complete by: As directed       Allergies as of 06/26/2021       Reactions   Codeine Shortness Of Breath   Dextromethorphan-guaifenesin Shortness Of Breath   Iron Dextran Shortness Of Breath   Morphine And Related Shortness Of Breath        Medication List     TAKE these medications    acetaminophen 500 MG tablet Commonly known as: TYLENOL Take 1 tablet (500 mg total) by mouth every 6 (six) hours as needed. What changed: reasons to take this   Advair Diskus 250-50 MCG/ACT Aepb Generic drug: fluticasone-salmeterol Inhale 1 puff into the lungs in the morning and at bedtime.   albuterol 108 (90 Base) MCG/ACT inhaler Commonly known as: ProAir HFA Inhale 2 puffs into the lungs every 6 (six) hours as needed for wheezing or shortness of breath.   amoxicillin-clavulanate 500-125 MG tablet Commonly known as: Augmentin Take 1 tablet (500 mg total) by mouth daily for 5 days. (In  the afternoon)   apixaban 2.5 MG Tabs tablet Commonly known as: ELIQUIS Take 1 tablet (2.5 mg total) by mouth 2 (two) times daily. Hold your eliquis if your platelets are over 50.  You should follow up with cardiology and hematology about your blood thinners. What changed: additional instructions   Dialyvite 800 0.8 MG Tabs Take 1 tablet by mouth daily.   docusate sodium 100 MG capsule Commonly known as: COLACE Take 1 capsule (100 mg total) by mouth 2 (two) times daily. What changed:  when to take this reasons to take this   doxycycline 100 MG tablet Commonly known as: VIBRA-TABS Take 1 tablet (100 mg total) by mouth every 12 (twelve) hours for 5 days.   glucose blood test strip   hydrOXYzine 10 MG tablet Commonly known as: ATARAX Take 1 tablet (10 mg total) by mouth 3 (three) times daily as needed. For anxiety What changed:  reasons to take this additional instructions   midodrine 10 MG tablet Commonly known as:  PROAMATINE Place 1 tablet (10 mg total) into feeding tube 3 (three) times daily with meals. What changed: how to take this   montelukast 10 MG tablet Commonly known as: SINGULAIR Take 1 tablet (10 mg total) by mouth at bedtime.   pantoprazole 40 MG tablet Commonly known as: Protonix Take 1 tablet (40 mg total) by mouth daily.   predniSONE 10 MG tablet Commonly known as: DELTASONE Take 4 tablets (40 mg total) by mouth daily for 3 days.   Velphoro 500 MG chewable tablet Generic drug: sucroferric oxyhydroxide Chew 1,000 mg by mouth 2 (two) times daily after Lachrisha Ziebarth meal.   Visine Advanced Relief 0.05-0.1-1-1 % Soln Generic drug: Tetrahydroz-Dextran-PEG-Povid Place 1 drop into both eyes 2 (two) times daily as needed (for dryness).               Discharge Care Instructions  (From admission, onward)           Start     Ordered   06/23/21 0000  Discharge wound care:       Comments: Per vascular surgery   06/23/21 1516           Allergies  Allergen Reactions   Codeine Shortness Of Breath   Dextromethorphan-Guaifenesin Shortness Of Breath   Iron Dextran Shortness Of Breath   Morphine And Related Shortness Of Breath    Follow-up Information     [facility] Follow up.   Why: 3-4 weeks. The office will call the patient with an appointment Contact information: [facility] Follow up.   Why: ([facility])-[facility] arranged- they will contact you to schedule [phone])                 The results of significant diagnostics from this hospitalization (including imaging, microbiology, ancillary and laboratory) are listed below for reference.    Significant Diagnostic Studies: CT CHEST ABDOMEN PELVIS WO CONTRAST  Result Date: 06/21/2021 CLINICAL DATA:  61 year old male with history of shortness of breath. EXAM: CT CHEST, ABDOMEN AND PELVIS WITHOUT CONTRAST  TECHNIQUE: Multidetector CT imaging of the chest, abdomen and pelvis was performed following the standard protocol without IV contrast. RADIATION DOSE REDUCTION: This exam was performed according to the departmental dose-optimization program which includes automated exposure control, adjustment of the mA and/or kV according to patient size and/or use of iterative reconstruction technique. COMPARISON:  High-resolution chest CT 10/08/2013. CT the  abdomen and pelvis 02/13/2010. FINDINGS: CT CHEST FINDINGS Cardiovascular: Heart size is moderately enlarged. There is no significant pericardial fluid, thickening or pericardial calcification. There is aortic atherosclerosis, as well as atherosclerosis of the great vessels of the mediastinum and the coronary arteries, including calcified atherosclerotic plaque in the left main, left anterior descending, left circumflex and right coronary arteries. Calcifications of the mitral annulus. Left internal jugular PermCath with tip terminating at the superior cavoatrial junction. Mediastinum/Nodes: No pathologically enlarged mediastinal or hilar lymph nodes. Please note that accurate exclusion of hilar adenopathy is limited on noncontrast CT scans. Esophagus is unremarkable in appearance. No axillary lymphadenopathy. Lungs/Pleura: Calcified pleural plaques are noted in the right hemithorax. No calcified pleural plaques are noted in the left hemithorax. Mild chronic generalized right-sided pleural thickening, similar to the prior study. Rounded opacity in the posterior aspect of the left lower lobe measuring approximately 6.3 x 4.2 cm (axial image 94 of series 4), centrally low-attenuation. Evaluation of the lungs is limited by considerable patient respiratory motion. With these limitations in mind, there are widespread areas of ground-glass attenuation and interlobular septal thickening, favored to reflect Savreen Gebhardt background of interstitial pulmonary edema. Additional patchy  peribronchovascular areas of airspace consolidation are noted in the left lower lobe, most severe in the superior segment. Musculoskeletal: Small amount of gas in the subcutaneous fat of the upper left chest wall related to recent dialysis catheter placement. There are no aggressive appearing lytic or blastic lesions noted in the visualized portions of the skeleton. CT ABDOMEN PELVIS FINDINGS Hepatobiliary: Liver has Jaryan Chicoine nodular contour, indicative of underlying cirrhosis. No discrete cystic or solid hepatic lesions are confidently identified on today's noncontrast CT examination. Numerous partially calcified gallstones are noted within the lumen of the gallbladder. Gallbladder is moderately distended. No definite pericholecystic fluid or surrounding inflammatory changes to indicate an acute cholecystitis at this time. Pancreas: No definite pancreatic mass or peripancreatic fluid collections or inflammatory changes are noted on today's noncontrast CT examination. Spleen: Unremarkable. Adrenals/Urinary Tract: There are innumerable lesions of varying degrees of complexity in both kidneys, many of which are extensively calcified, incompletely characterized on today's non-contrast CT examination, largest of which is in the anterior aspect of the interpolar region of the left kidney (axial image 64 of series 3) measuring 6.7 cm in diameter. This largest lesion and several other lesions have clearly enlarged when compared to remote prior examination from 2012. No hydroureteronephrosis. Urinary bladder is completely decompressed. Bilateral adrenal glands are normal in appearance. Stomach/Bowel: Unenhanced appearance of the stomach is normal. No pathologic dilatation of small bowel or colon. Kween Bacorn few scattered colonic diverticulae are noted, without surrounding inflammatory changes to suggest an acute diverticulitis at this time. The appendix is not confidently identified and may be surgically absent. Regardless, there are no  inflammatory changes noted adjacent to the cecum to suggest the presence of an acute appendicitis at this time. Vascular/Lymphatic: Aortic atherosclerosis. No definite lymphadenopathy confidently identified in the abdomen or pelvis on today's noncontrast CT examination. Reproductive: Prostate gland and seminal vesicles are unremarkable in appearance. Other: Densely calcified structure in the right iliac fossa, presumably Valarie Farace transplant kidney. No significant volume of ascites. No pneumoperitoneum. Musculoskeletal: There are no aggressive appearing lytic or blastic lesions noted in the visualized portions of the skeleton. IMPRESSION: 1. Rounded low-attenuation opacity in the left lower lobe, poorly evaluated on today's noncontrast CT examination. Given the adjacent patchy airspace consolidation in the superior segment of the left lower lobe, findings are concerning for potential necrotizing  pneumonia. Elky Funches loculated pleural fluid collection is not excluded, but not strongly favored. 2. Very unusual appearance of the kidneys with innumerable lesions, many of which have clearly enlarged compared to remote prior study from 11 years ago and are complex in appearance including several which are partially calcified. The possibility of aggressive renal neoplasm(s) is not excluded, and further evaluation with nonemergent abdominal MRI with and without IV gadolinium is suggested in the near future to better evaluate these findings. 3. Cardiomegaly with evidence of interstitial pulmonary edema in the lungs; imaging findings concerning for congestive heart failure. 4. Cirrhosis. 5. Cholelithiasis without evidence of acute cholecystitis at this time. 6. Aortic atherosclerosis, in addition to left main and three-vessel coronary artery disease. Please note that although the presence of coronary artery calcium documents the presence of coronary artery disease, the severity of this disease and any potential stenosis cannot be assessed on  this non-gated CT examination. Assessment for potential risk factor modification, dietary therapy or pharmacologic therapy may be warranted, if clinically indicated. 7. Additional incidental findings, as above. Electronically Signed   By: Vinnie Langton M.D.   On: 06/21/2021 10:15   DG CHEST PORT 1 VIEW  Result Date: 06/21/2021 CLINICAL DATA:  Shortness of breath, follow-up EXAM: PORTABLE CHEST 1 VIEW COMPARISON:  06/20/2021 FINDINGS: Interval placement of left IJ approach dialysis catheter. Tip overlies the central SVC. Stable cardiomegaly. Possible pulmonary edema superimposed on chronic interstitial changes. No significant pleural effusion. No pneumothorax. IMPRESSION: No significant change. Possible pulmonary edema superimposed on chronic interstitial changes. Electronically Signed   By: Macy Mis M.D.   On: 06/21/2021 08:49   DG C-Arm 1-60 Min  Result Date: 06/20/2021 CLINICAL DATA:  Insertion of dialysis catheter EXAM: DG C-ARM 1-60 MIN COMPARISON:  Chest radiograph from earlier today. FLUOROSCOPY TIME:  Radiation Exposure Index (if provided by the fluoroscopic device): 11.8 mGy FINDINGS: Spot fluoroscopic intraoperative coned chest radiograph demonstrates left internal jugular central venous catheter terminating over the right atrium. Endotracheal tube tip is 2.4 cm above the carina. IMPRESSION: Intraoperative fluoroscopic guidance for left internal jugular central venous catheter placement. Electronically Signed   By: Ilona Sorrel M.D.   On: 06/20/2021 09:12   DG Chest Port 1 View  Result Date: 06/20/2021 CLINICAL DATA:  61 year old male with history of shortness of breath and productive cough. EXAM: PORTABLE CHEST 1 VIEW COMPARISON:  Chest x-ray 06/07/2021. FINDINGS: Lung volumes are slightly low. Widespread patchy areas of interstitial prominence, peribronchial cuffing, patchy ill-defined opacities and areas of architectural distortion are again noted throughout the lungs bilaterally,  most evident in the mid to lower lungs (right-greater-than-left) with areas of chronic pleuroparenchymal scarring and architectural distortion in the right mid to lower lung, similar to prior examinations. No definite acute consolidative airspace disease. No definite pleural effusions. No pneumothorax. Pulmonary vasculature is largely obscured. Heart size is mildly enlarged. Mediastinal contours are distorted by patient's positioning. Atherosclerotic calcifications in the thoracic aorta. IMPRESSION: 1. Grossly abnormal chest x-ray, which is largely similar to prior examinations. Overall, imaging findings are favored to reflect interstitial lung disease and areas of chronic post infectious or inflammatory scarring (particularly in the right lung base), given the similarity to prior studies. The possibility of acute disease (edema or infection) is not excluded, but not strongly favored. These findings could be much better evaluated with follow-up high-resolution chest CT in the near future. 2. Cardiomegaly. 3. Aortic atherosclerosis. Electronically Signed   By: Vinnie Langton M.D.   On: 06/20/2021 05:30  DG Chest 2 View  Result Date: 06/07/2021 CLINICAL DATA:  productive cough/pna. History of recurrent pneumonia EXAM: CHEST - 2 VIEW COMPARISON:  Chest x-ray 03/11/2021 FINDINGS: Cardiomegaly. The heart and mediastinal contours are unchanged. Aortic calcification. Improved aeration of the left lower lobe. No new focal consolidation. Linear atelectasis versus scarring of the right lower lobe again noted. Chronic coarsened interstitial markings with no overt pulmonary edema. Persistent blunting of the right costophrenic angle. No pleural effusion. No pneumothorax. No acute osseous abnormality. IMPRESSION: 1. No definite new focal consolidation. 2. No active cardiopulmonary disease in Blythe Hartshorn patient with underlying lung scarring. 3. Chronic blunting of the right costophrenic angle with underlying trace pleural effusion  not excluded. 4.  Aortic Atherosclerosis (ICD10-I70.0). Electronically Signed   By: Iven Finn M.D.   On: 06/07/2021 16:58    Microbiology: Recent Results (from the past 240 hour(s))  Resp Panel by RT-PCR (Flu Laquan Ludden&B, Covid) Anterior Nasal Swab     Status: None   Collection Time: 06/20/21  6:13 AM   Specimen: Anterior Nasal Swab  Result Value Ref Range Status   SARS Coronavirus 2 by RT PCR NEGATIVE NEGATIVE Final    Comment: (NOTE) SARS-CoV-2 target nucleic acids are NOT DETECTED.  The SARS-CoV-2 RNA is generally detectable in upper respiratory specimens during the acute phase of infection. The lowest concentration of SARS-CoV-2 viral copies this assay can detect is 138 copies/mL. Anaily Ashbaugh negative result does not preclude SARS-Cov-2 infection and should not be used as the sole basis for treatment or other patient management decisions. Geovany Trudo negative result may occur with  improper specimen collection/handling, submission of specimen other than nasopharyngeal swab, presence of viral mutation(s) within the areas targeted by this assay, and inadequate number of viral copies(<138 copies/mL). Shamela Haydon negative result must be combined with clinical observations, patient history, and epidemiological information. The expected result is Negative.  Fact Sheet for Patients:  EntrepreneurPulse.com.au  Fact Sheet for Healthcare Providers:  IncredibleEmployment.be  This test is no t yet approved or cleared by the [place] FDA and  has been authorized for detection and/or diagnosis of SARS-CoV-2 by FDA under an Emergency Use Authorization (EUA). This EUA will remain  in effect (meaning this test can be used) for the duration of the COVID-19 declaration under Section 564(b)(1) of the Act, 21 U.S.C.section 360bbb-3(b)(1), unless the authorization is terminated  or revoked sooner.       Influenza Ramsha Lonigro by PCR NEGATIVE NEGATIVE Final   Influenza B by PCR NEGATIVE NEGATIVE  Final    Comment: (NOTE) The Xpert Xpress SARS-CoV-2/FLU/RSV plus assay is intended as an aid in the diagnosis of influenza from Nasopharyngeal swab specimens and should not be used as Philo Kurtz sole basis for treatment. Nasal washings and aspirates are unacceptable for Xpert Xpress SARS-CoV-2/FLU/RSV testing.  Fact Sheet for Patients: EntrepreneurPulse.com.au  Fact Sheet for Healthcare Providers: IncredibleEmployment.be  This test is not yet approved or cleared by the [place] FDA and has been authorized for detection and/or diagnosis of SARS-CoV-2 by FDA under an Emergency Use Authorization (EUA). This EUA will remain in effect (meaning this test can be used) for the duration of the COVID-19 declaration under Section 564(b)(1) of the Act, 21 U.S.C. section 360bbb-3(b)(1), unless the authorization is terminated or revoked.  Performed at [facility], [place] [address], [place], [place] [address]   MRSA Next Gen by PCR, Nasal     Status: None   Collection Time: 06/20/21  8:39 PM   Specimen: Nasal Mucosa; Nasal Swab  Result Value Ref Range Status   MRSA by PCR Next Gen NOT DETECTED NOT DETECTED Final    Comment: (NOTE) The GeneXpert MRSA Assay (FDA approved for NASAL specimens only), is one component of Ruddy Swire comprehensive MRSA colonization surveillance program. It is not intended to diagnose MRSA infection nor to guide or monitor treatment for MRSA infections. Test performance is not FDA approved in patients less than 41 years old. Performed at [facility], [place] [address], [place], [place] [address]   Culture, blood (Routine X 2) w Reflex to ID Panel     Status: None   Collection Time: 06/21/21 12:51 PM   Specimen: BLOOD RIGHT ARM  Result Value Ref Range Status   Specimen Description BLOOD RIGHT ARM UPPER  Final   Special Requests   Final    BOTTLES DRAWN AEROBIC AND ANAEROBIC Blood Culture adequate volume   Culture   Final    NO  GROWTH 5 DAYS Performed at [facility], [place] [address] [place], [place] [address]    Report Status 06/26/2021 FINAL  Final  Culture, blood (Routine X 2) w Reflex to ID Panel     Status: None   Collection Time: 06/21/21  1:03 PM   Specimen: BLOOD RIGHT ARM  Result Value Ref Range Status   Specimen Description BLOOD RIGHT ARM UPPER  Final   Special Requests   Final    BOTTLES DRAWN AEROBIC AND ANAEROBIC Blood Culture adequate volume   Culture   Final    NO GROWTH 5 DAYS Performed at [facility], [place] [address] [place], [place] [address]    Report Status 06/26/2021 FINAL  Final  Culture, Respiratory w Gram Stain     Status: None   Collection Time: 06/21/21  5:21 PM   Specimen: Tracheal Aspirate; Respiratory  Result Value Ref Range Status   Specimen Description TRACHEAL ASPIRATE  Final   Special Requests NONE  Final   Gram Stain   Final    ABUNDANT SQUAMOUS EPITHELIAL CELLS PRESENT ABUNDANT WBC PRESENT, PREDOMINANTLY PMN RARE GRAM POSITIVE COCCI RARE GRAM NEGATIVE RODS    Culture   Final    RARE Normal respiratory flora-no Staph aureus or Pseudomonas seen Performed at [facility], [place] [address], [place], [place] [address]    Report Status 06/24/2021 FINAL  Final     Labs: Basic Metabolic Panel: Recent Labs  Lab 06/20/21 2004 06/21/21 0415 06/21/21 0930 06/22/21 0501 06/23/21 0236 06/24/21 0751 06/25/21 0235 06/26/21 0242  NA 135   < >  --  129* 136 135 133* 136  K 3.8   < >  --  5.7* 6.1* 5.0 4.0 4.1  CL 97*   < >  --  93* 93* 94* 94* 94*  CO2 26   < >  --  21* 21* 20* 24 24  GLUCOSE 155*   < >  --  114* 99 75 138* 122*  BUN 39*   < >  --  68* 93* 59* 39* 67*  CREATININE 6.04*   < >  --  9.36* 10.55* 7.41* 5.50* 7.97*  CALCIUM 8.4*   < >  --  8.2* 8.9 8.6* 8.6* 9.2  MG 1.7  --   --  2.1 2.4  --  1.7 2.0  PHOS  --   --  6.9* 8.1* 10.0*  --  5.0* 7.7*   < > = values in this interval not displayed.   Liver Function Tests:  Recent Labs  Lab  06/21/21 0930 06/22/21 0501 06/23/21 0236 06/25/21 0235 06/26/21 0242  AST '20 27 21 20 15  '$ ALT '10 8 6 8 7  '$ ALKPHOS 38 35* 37* 46 46  BILITOT 0.9 1.1 1.1 0.8 0.8  PROT 5.8* 5.7* 6.1* 6.1* 6.6  ALBUMIN 2.8* 2.7* 2.6* 2.6* 2.6*   Recent Labs  Lab 06/21/21 0930  LIPASE 43   Recent Labs  Lab 06/24/21 1818  AMMONIA 15   CBC: Recent Labs  Lab 06/22/21 0501 06/23/21 0236 06/24/21 0751 06/25/21 0235 06/26/21 0242  WBC 13.0* 11.5* 11.2* 10.9* 9.7  NEUTROABS 11.8* 10.4* 9.3* 9.3* 8.3*  HGB 8.7* 8.0* 8.6* 8.3* 8.4*  HCT 26.0* 25.2* 26.7* 25.2* 25.4*  MCV 95.9 95.1 94.3 92.0 91.4  PLT 40* 47* 58* 65* 81*   Cardiac Enzymes: No results for input(s): "CKTOTAL", "CKMB", "CKMBINDEX", "TROPONINI" in the last 168 hours. BNP: BNP (last 3 results) Recent Labs    06/20/21 0444  BNP 2,645.2*    ProBNP (last 3 results) No results for input(s): "PROBNP" in the last 8760 hours.  CBG: Recent Labs  Lab 06/24/21 1228 06/24/21 1817 06/25/21 0031 06/25/21 1231 06/26/21 0021  GLUCAP 100* 84 87 100* 140*       Signed:  Fayrene Helper MD.  [facility] 06/26/2021, 3:39 PM

## 2021-06-26 NOTE — Assessment & Plan Note (Signed)
Short runs Will hold off on BB with need for midodrine Follow with cards outpatient

## 2021-06-27 ENCOUNTER — Telehealth: Payer: Self-pay

## 2021-06-27 ENCOUNTER — Telehealth: Payer: Self-pay | Admitting: *Deleted

## 2021-06-27 ENCOUNTER — Other Ambulatory Visit: Payer: Self-pay | Admitting: Nurse Practitioner

## 2021-06-27 DIAGNOSIS — N186 End stage renal disease: Secondary | ICD-10-CM

## 2021-06-27 DIAGNOSIS — I110 Hypertensive heart disease with heart failure: Secondary | ICD-10-CM

## 2021-06-27 DIAGNOSIS — Z8673 Personal history of transient ischemic attack (TIA), and cerebral infarction without residual deficits: Secondary | ICD-10-CM

## 2021-06-27 NOTE — Telephone Encounter (Signed)
Transition Care Management Follow-up Telephone Call Date of discharge and from where: 06/26/21 [facility] How have you been since you were released from the hospital? "Doing OK" Any questions or concerns? No  Items Reviewed: Did the pt receive and understand the discharge instructions provided? Yes  Medications obtained and verified? Yes  Other? No  Any new allergies since your discharge? No  Dietary orders reviewed? Yes Do you have support at home? Yes   Home Care and Equipment/Supplies: Were home health services ordered? yes If so, what is the name of the agency? [facility] Has the agency set up a time to come to the patient's home? Patient states they are to call him today at 2:00 pm to arrange initial home visit Were any new equipment or medical supplies ordered?  No What is the name of the medical supply agency? Not applicable Were you able to get the supplies/equipment? not applicable Do you have any questions related to the use of the equipment or supplies? Not applicable  Functional Questionnaire: (I = Independent and D = Dependent) ADLs: I  Bathing/Dressing- I  Meal Prep- I  Eating- I  Maintaining continence- I  Transferring/Ambulation- Assist with walker  Managing Meds- I  Follow up appointments reviewed:  PCP Hospital f/u appt confirmed? Yes  Scheduled to see Geryl Rankins NP on 07/07/21 @ 10:50 am. [facility] f/u appt confirmed? Yes  Scheduled to see Dr. Julianne Handler on 08/09/21 @ 3:40pm. Are transportation arrangements needed? No  If their condition worsens, is the pt aware to call PCP or go to the Emergency Dept.? Yes Was the patient provided with contact information for the PCP's office or ED? Yes Was to pt encouraged to call back with questions or concerns? Yes   Kelli Churn RN, CCM, [facility] Care Management Coordinator  [phone]

## 2021-06-27 NOTE — Telephone Encounter (Signed)
Transition Care Management Follow-up Telephone Call Date of discharge and from where: 06/26/2021, [facility] How have you been since you were released from the hospital? He said he is feeling pretty good.  Any questions or concerns? Yes - he would like a lightweight rollator and does not have a preference for DME companies.   Items Reviewed: Did the pt receive and understand the discharge instructions provided? Yes  Medications obtained and verified? Yes - he said he has all of his medications and did not have any questions about the med regime.  Other? No  Any new allergies since your discharge? No  Dietary orders reviewed? No Do you have support at home?  He said he has some people who live with him . No family in the [place] area.  Home Care and Equipment/Supplies: Were home health services ordered? yes If so, what is the name of the agency? [facility]  Has the agency set up a time to come to the patient's home? Yes- PT is coming to see him today.  Were any new equipment or medical supplies ordered?  No What is the name of the medical supply agency? N/a Were you able to get the supplies/equipment? not applicable Do you have any questions related to the use of the equipment or supplies? No  He attends HD: T[facility]T/S at [facility]: (I = Independent and D = Dependent) ADLs: independent with personal care, uses walker with ambulation   Follow up appointments reviewed:  PCP Hospital f/u appt confirmed? Yes  Scheduled to see Geryl Rankins, NP -  07/07/2021, reminder letter sent to patient as he requested. [facility] f/u appt confirmed? Yes  Scheduled to see cardiology- 08/09/2021.  Are transportation arrangements needed? No - he drives or he said someone will take him to appointments if needed If their condition worsens, is the pt aware to call PCP or go to the Emergency Dept.? Yes Was the patient provided with contact  information for the PCP's office or ED? Yes Was to pt encouraged to call back with questions or concerns? Yes

## 2021-06-28 ENCOUNTER — Telehealth: Payer: Self-pay | Admitting: Nurse Practitioner

## 2021-06-28 NOTE — Telephone Encounter (Signed)
Transition of care contact from inpatient facility  Date of Discharge: 06/26/2021 Date of Contact: 06/28/2021 Method of contact: Phone  Attempted to contact patient to discuss transition of care from inpatient admission. Patient did not answer the phone. Unable to leave message as no voicemail available.

## 2021-06-28 NOTE — Telephone Encounter (Unsigned)
Copied from [facility] [phone]. Topic: Quick Communication - Home Health Verbal Orders >> Jun 28, 2021  4:35 PM Ja-Kwan M wrote: Caller/Agency: Claiborne Billings with [facility] Number: [phone] secured line Requesting OT/PT/Skilled Nursing/Social Work/Speech Therapy: PT in home Frequency: 1 time a week for 4 weeks

## 2021-06-29 ENCOUNTER — Telehealth: Payer: Self-pay | Admitting: Nurse Practitioner

## 2021-06-29 NOTE — Telephone Encounter (Signed)
Copied from [place] [id number]. Topic: General - Other >> Jun 28, 2021  4:57 PM Rudene Anda wrote: Reason for CRM: [facility] called in regarding a recent order that was sent over, caller stated they need the office notes faxed over as well - fax [phone]

## 2021-06-29 NOTE — Telephone Encounter (Signed)
Claiborne Billings was called and given verbal orders for patient.

## 2021-06-30 ENCOUNTER — Other Ambulatory Visit: Payer: Self-pay | Admitting: *Deleted

## 2021-06-30 DIAGNOSIS — N186 End stage renal disease: Secondary | ICD-10-CM

## 2021-07-04 ENCOUNTER — Telehealth: Payer: Self-pay | Admitting: Nurse Practitioner

## 2021-07-04 NOTE — Telephone Encounter (Signed)
Copied from CRM [id number]. Topic: General - Other >> Jul 03, 2021 11:54 AM Everette C wrote: Reason for CRM: Tresa Endo with [facility] has called to request a prescription for the patient to receive a rollator walker  Please fax rx to [facility] at [phone]  The prescription needs to expressly state for a rollator, as well as last clinical visit notes and a demographic sheet for the patient  Please contact further if needed

## 2021-07-05 NOTE — Telephone Encounter (Signed)
Claiborne Billings returned call and requested to speak with Opal Sidles.

## 2021-07-05 NOTE — Telephone Encounter (Signed)
Call returned to Iris Pert, PT/ [place] [phone] regarding order for rollator.  Message left with call back requested.   The order is for a 4 wheeled walker with a seat. The provider's note does not address the need for the rollator. He has an appointment with his PCP on 07/07/2021.

## 2021-07-06 NOTE — Telephone Encounter (Signed)
Call returned to Iris Pert, PT/ [place] [phone] regarding order for rollator.  Message left with call back requested

## 2021-07-07 ENCOUNTER — Ambulatory Visit: Payer: Medicare Other | Admitting: Nurse Practitioner

## 2021-07-10 ENCOUNTER — Emergency Department (HOSPITAL_BASED_OUTPATIENT_CLINIC_OR_DEPARTMENT_OTHER)
Admission: EM | Admit: 2021-07-10 | Discharge: 2021-07-10 | Disposition: A | Discharge status: Home / Self Care | Payer: Medicare Other | Attending: Emergency Medicine | Admitting: Emergency Medicine

## 2021-07-10 ENCOUNTER — Other Ambulatory Visit: Payer: Self-pay

## 2021-07-10 ENCOUNTER — Emergency Department (HOSPITAL_BASED_OUTPATIENT_CLINIC_OR_DEPARTMENT_OTHER): Payer: Medicare Other

## 2021-07-10 ENCOUNTER — Encounter (HOSPITAL_BASED_OUTPATIENT_CLINIC_OR_DEPARTMENT_OTHER): Payer: Self-pay | Admitting: Pediatrics

## 2021-07-10 DIAGNOSIS — Z7951 Long term (current) use of inhaled steroids: Secondary | ICD-10-CM | POA: Insufficient documentation

## 2021-07-10 DIAGNOSIS — N186 End stage renal disease: Secondary | ICD-10-CM | POA: Insufficient documentation

## 2021-07-10 DIAGNOSIS — E119 Type 2 diabetes mellitus without complications: Secondary | ICD-10-CM | POA: Insufficient documentation

## 2021-07-10 DIAGNOSIS — Z992 Dependence on renal dialysis: Secondary | ICD-10-CM | POA: Diagnosis not present

## 2021-07-10 DIAGNOSIS — I509 Heart failure, unspecified: Secondary | ICD-10-CM | POA: Insufficient documentation

## 2021-07-10 DIAGNOSIS — R052 Subacute cough: Secondary | ICD-10-CM | POA: Diagnosis not present

## 2021-07-10 DIAGNOSIS — R0602 Shortness of breath: Secondary | ICD-10-CM | POA: Diagnosis present

## 2021-07-10 DIAGNOSIS — R600 Localized edema: Secondary | ICD-10-CM | POA: Diagnosis not present

## 2021-07-10 DIAGNOSIS — J45909 Unspecified asthma, uncomplicated: Secondary | ICD-10-CM | POA: Insufficient documentation

## 2021-07-10 DIAGNOSIS — Z7901 Long term (current) use of anticoagulants: Secondary | ICD-10-CM | POA: Diagnosis not present

## 2021-07-10 LAB — CBC WITH DIFFERENTIAL/PLATELET
Abs Immature Granulocytes: 0.01 10*3/uL (ref 0.00–0.07)
Basophils Absolute: 0 10*3/uL (ref 0.0–0.1)
Basophils Relative: 1 %
Eosinophils Absolute: 0.3 10*3/uL (ref 0.0–0.5)
Eosinophils Relative: 6 %
HCT: 30.6 % — ABNORMAL LOW (ref 39.0–52.0)
Hemoglobin: 9.5 g/dL — ABNORMAL LOW (ref 13.0–17.0)
Immature Granulocytes: 0 %
Lymphocytes Relative: 13 %
Lymphs Abs: 0.8 10*3/uL (ref 0.7–4.0)
MCH: 30.1 pg (ref 26.0–34.0)
MCHC: 31 g/dL (ref 30.0–36.0)
MCV: 96.8 fL (ref 80.0–100.0)
Monocytes Absolute: 0.6 10*3/uL (ref 0.1–1.0)
Monocytes Relative: 10 %
Neutro Abs: 4.2 10*3/uL (ref 1.7–7.7)
Neutrophils Relative %: 70 %
Platelets: 109 10*3/uL — ABNORMAL LOW (ref 150–400)
RBC: 3.16 MIL/uL — ABNORMAL LOW (ref 4.22–5.81)
RDW: 18.7 % — ABNORMAL HIGH (ref 11.5–15.5)
WBC: 5.9 10*3/uL (ref 4.0–10.5)
nRBC: 0 % (ref 0.0–0.2)

## 2021-07-10 LAB — COMPREHENSIVE METABOLIC PANEL
ALT: 9 U/L (ref 0–44)
AST: 14 U/L — ABNORMAL LOW (ref 15–41)
Albumin: 3.7 g/dL (ref 3.5–5.0)
Alkaline Phosphatase: 76 U/L (ref 38–126)
Anion gap: 20 — ABNORMAL HIGH (ref 5–15)
BUN: 94 mg/dL — ABNORMAL HIGH (ref 6–20)
CO2: 21 mmol/L — ABNORMAL LOW (ref 22–32)
Calcium: 9.6 mg/dL (ref 8.9–10.3)
Chloride: 99 mmol/L (ref 98–111)
Creatinine, Ser: 10.97 mg/dL (ref 0.61–1.24)
GFR, Estimated: 5 mL/min — ABNORMAL LOW (ref >60)
Glucose, Bld: 114 mg/dL — ABNORMAL HIGH (ref 70–99)
Potassium: 4.5 mmol/L (ref 3.5–5.1)
Sodium: 140 mmol/L (ref 135–145)
Total Bilirubin: 0.8 mg/dL (ref 0.3–1.2)
Total Protein: 7.3 g/dL (ref 6.5–8.1)

## 2021-07-10 MED ORDER — AMOXICILLIN-POT CLAVULANATE 875-125 MG PO TABS
1.0000 | ORAL_TABLET | Freq: Once | ORAL | Status: AC
Start: 2021-07-10 — End: 2021-07-10
  Administered 2021-07-10: 1 via ORAL
  Filled 2021-07-10: qty 1

## 2021-07-10 MED ORDER — AMOXICILLIN-POT CLAVULANATE 875-125 MG PO TABS: 1.0000 | ORAL_TABLET | Freq: Two times a day (BID) | ORAL | 0 refills | Status: AC

## 2021-07-10 MED ORDER — DOXYCYCLINE HYCLATE 100 MG PO TABS
100.0000 mg | ORAL_TABLET | Freq: Once | ORAL | Status: AC
  Administered 2021-07-10: 100 mg via ORAL
  Filled 2021-07-10: qty 1

## 2021-07-10 MED ORDER — IPRATROPIUM-ALBUTEROL 0.5-2.5 (3) MG/3ML IN SOLN
3.0000 mL | Freq: Once | RESPIRATORY_TRACT | Status: AC
  Administered 2021-07-10: 3 mL via RESPIRATORY_TRACT
  Filled 2021-07-10: qty 3

## 2021-07-10 MED ORDER — DOXYCYCLINE HYCLATE 100 MG PO CAPS: 100.0000 mg | ORAL_CAPSULE | Freq: Two times a day (BID) | ORAL | 0 refills | Status: AC

## 2021-07-10 NOTE — Discharge Instructions (Addendum)
Your xray appears unchanged from the last one and your oxygen levels are very reassuring. I have sent in an additional five days of antibiotics - and your first dose was given here in the ED. please refer to keep your dialysis appointment tomorrow, as I believe that this is also playing affecting how you are feeling.  If you are continuing to decline by your follow-up appointments with your PCP on Wednesday, feel free to return to the emergency department for reevaluation.

## 2021-07-10 NOTE — ED Triage Notes (Signed)
C/O shortness of breathe since Saturday; reports dialysis q T/Th/Sa. Last one was Saturday; reports recent admission d/t bleeding graft;

## 2021-07-10 NOTE — ED Provider Notes (Signed)
[facility] Provider Note   CSN: [id number] Arrival date & time: 07/10/21  4097     History  Chief Complaint  Patient presents with   Shortness of Breath    Nathaniel White is a 61 y.o. male with hx of ESRD on dialysis TTHS, CHF (EF 45% 03/2021), hx of restrictive lung disease, asthma, Type II diabetes and previous strokes with residual left sided deficits presents to the ED for evaluation of worsening cough and shortness of breath over the last 2 days.  Patient states he has been compliant on his dialysis which he received 2 days ago on Saturday.  He was recently discharged from hospital 06/26/2021 after 6 day stay for bleeding fistula and necrotizing pneumonia.  He was given IV antibiotics and then discharged home with Augmentin and doxycycline.  He notes he completed both of these medications 2 days ago which is when his symptoms seem to return.  Cough is nonproductive though it is wet sounding.  He states that he has baseline swelling of his lower extremities although his left lower extremity appears worse today.  He denies fevers, chills, chest pain, abdominal pain, nausea, vomiting and diarrhea.   Shortness of Breath Associated symptoms: cough   Associated symptoms: no abdominal pain, no chest pain, no fever and no vomiting        Home Medications Prior to Admission medications   Medication Sig Start Date End Date Taking? Authorizing Provider  amoxicillin-clavulanate (AUGMENTIN) 875-125 MG tablet Take 1 tablet by mouth every 12 (twelve) hours for 5 days. 07/10/21 07/15/21 Yes Kathe Becton R, PA-C  doxycycline (VIBRAMYCIN) 100 MG capsule Take 1 capsule (100 mg total) by mouth 2 (two) times daily for 5 days. 07/10/21 07/15/21 Yes Tonye Pearson, PA-C  acetaminophen (TYLENOL) 500 MG tablet Take 1 tablet (500 mg total) by mouth every 6 (six) hours as needed. Patient taking differently: Take 500 mg by mouth every 6 (six) hours as needed for mild pain or  headache. 04/11/18   Zigmund Gottron, NP  albuterol (PROAIR HFA) 108 (90 Base) MCG/ACT inhaler Inhale 2 puffs into the lungs every 6 (six) hours as needed for wheezing or shortness of breath. 06/07/21   Gildardo Pounds, NP  apixaban (ELIQUIS) 2.5 MG TABS tablet Take 1 tablet (2.5 mg total) by mouth 2 (two) times daily. Hold your eliquis if your platelets are over 50.  You should follow up with cardiology and hematology about your blood thinners. 06/25/21   Elodia Florence., MD  B Complex-C-Folic Acid (DIALYVITE 353) 0.8 MG TABS Take 1 tablet by mouth daily. 04/14/21   [provider]  docusate sodium (COLACE) 100 MG capsule Take 1 capsule (100 mg total) by mouth 2 (two) times daily. Patient taking differently: Take 100 mg by mouth 2 (two) times daily as needed for mild constipation. 03/15/21   Sheikh, Omair Latif, DO  fluticasone-salmeterol (ADVAIR DISKUS) 250-50 MCG/ACT AEPB Inhale 1 puff into the lungs in the morning and at bedtime. 06/07/21   Gildardo Pounds, NP  glucose blood test strip  12/26/09   [provider]  hydrOXYzine (ATARAX) 10 MG tablet Take 1 tablet (10 mg total) by mouth 3 (three) times daily as needed. For anxiety Patient taking differently: Take 10 mg by mouth 3 (three) times daily as needed for anxiety. 06/07/21   Gildardo Pounds, NP  midodrine (PROAMATINE) 10 MG tablet Place 1 tablet (10 mg total) into feeding tube 3 (three) times daily  with meals. Patient taking differently: Take 10 mg by mouth 3 (three) times daily with meals. 03/15/21   Sheikh, Omair Latif, DO  montelukast (SINGULAIR) 10 MG tablet Take 1 tablet (10 mg total) by mouth at bedtime. 06/07/21   Gildardo Pounds, NP  pantoprazole (PROTONIX) 40 MG tablet Take 1 tablet (40 mg total) by mouth daily. 06/23/21 06/23/22  Elodia Florence., MD  VELPHORO 500 MG chewable tablet Chew 1,000 mg by mouth 2 (two) times daily after a meal. 02/06/21   [provider]  VISINE ADVANCED RELIEF 0.05-0.1-1-1 %  SOLN Place 1 drop into both eyes 2 (two) times daily as needed (for dryness).    [provider]      Allergies    Codeine, Dextromethorphan-guaifenesin, Iron dextran, and Morphine and related    Review of Systems   Review of Systems  Constitutional:  Negative for fever.  Respiratory:  Positive for cough and shortness of breath.   Cardiovascular:  Positive for leg swelling. Negative for chest pain.  Gastrointestinal:  Negative for abdominal pain, diarrhea, nausea and vomiting.    Physical Exam Updated Vital Signs BP 134/81   Pulse 94   Temp 97.9 F (36.6 C) (Oral)   Resp 18   Ht '5\' 9"'$  (1.753 m)   Wt 76 kg   SpO2 95%   BMI 24.74 kg/m  Physical Exam Vitals and nursing note reviewed.  Constitutional:      General: He is not in acute distress.    Appearance: He is ill-appearing.  HENT:     Head: Atraumatic.  Eyes:     Conjunctiva/sclera: Conjunctivae normal.  Cardiovascular:     Rate and Rhythm: Normal rate and regular rhythm.     Pulses: Normal pulses.          Radial pulses are 2+ on the right side and 2+ on the left side.     Heart sounds: No murmur heard. Pulmonary:     Effort: No respiratory distress.     Breath sounds: Normal breath sounds. No rhonchi or rales.     Comments: Frequent nonproductive coughing fits. No obvious wheezes or ronchi Abdominal:     General: Abdomen is flat. There is no distension.     Palpations: Abdomen is soft.     Tenderness: There is no abdominal tenderness.  Musculoskeletal:        General: Normal range of motion.     Cervical back: Normal range of motion.     Right lower leg: 3+ Edema present.     Left lower leg: 2+ Edema present.  Skin:    General: Skin is warm and dry.     Capillary Refill: Capillary refill takes less than 2 seconds.  Neurological:     General: No focal deficit present.     Mental Status: He is alert.  Psychiatric:        Mood and Affect: Mood normal.     ED Results / Procedures / Treatments    Labs (all labs ordered are listed, but only abnormal results are displayed) Labs Reviewed  CBC WITH DIFFERENTIAL/PLATELET - Abnormal; Notable for the following components:      Result Value   RBC 3.16 (*)    Hemoglobin 9.5 (*)    HCT 30.6 (*)    RDW 18.7 (*)    Platelets 109 (*)    All other components within normal limits  COMPREHENSIVE METABOLIC PANEL - Abnormal; Notable for the following components:   CO2  21 (*)    Glucose, Bld 114 (*)    BUN 94 (*)    Creatinine, Ser 10.97 (*)    AST 14 (*)    GFR, Estimated 5 (*)    Anion gap 20 (*)    All other components within normal limits    EKG EKG Interpretation  Date/Time:  Monday July 10 2021 09:36:21 EDT Ventricular Rate:  90 PR Interval:    QRS Duration: 113 QT Interval:  399 QTC Calculation: 483 R Axis:   12 Text Interpretation: Atrial fibrillation Probable lateral infarct, age indeterminate Confirmed by Thamas Jaegers ([id number]) on 07/10/2021 10:00:10 AM  Radiology DG Chest Portable 1 View  Result Date: 07/10/2021 CLINICAL DATA:  Shortness of breath. EXAM: PORTABLE CHEST 1 VIEW COMPARISON:  CT chest/abdomen/pelvis 06/21/2021. Chest radiograph 06/21/2021. FINDINGS: Left chest dialysis catheter with tip projecting at the level of the superior cavoatrial junction. Cardiomegaly. Aortic atherosclerosis. Prominence of the interstitial lung markings with subtle bilateral ill-defined airspace opacities. Superimposed pulmonary scarring within the right mid to lower lung field, unchanged. Small right pleural effusion. No evidence of pneumothorax. No acute bony abnormality identified. IMPRESSION: Prominence of the interstitial lung markings with subtle bilateral ill-defined airspace opacities. Findings likely reflect pulmonary edema. However, atypical/viral pneumonia may have a similar imaging appearance and clinical correlation is recommended. Superimposed chronic pulmonary scarring within the right mid-to-lower lung field. Small right pleural  effusion. Cardiomegaly. Aortic Atherosclerosis (ICD10-I70.0). Electronically Signed   By: Kellie Simmering D.O.   On: 07/10/2021 09:59    Procedures Procedures    Medications Ordered in ED Medications  ipratropium-albuterol (DUONEB) 0.5-2.5 (3) MG/3ML nebulizer solution 3 mL (3 mLs Nebulization Given 07/10/21 1023)  amoxicillin-clavulanate (AUGMENTIN) 875-125 MG per tablet 1 tablet (1 tablet Oral Given 07/10/21 1138)  doxycycline (VIBRA-TABS) tablet 100 mg (100 mg Oral Given 07/10/21 1138)    ED Course/ Medical Decision Making/ A&P                           Medical Decision Making Amount and/or Complexity of Data Reviewed Labs: ordered. Radiology: ordered.  Risk Prescription drug management.   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  Tobacco Use   Smoking status: Former    Packs/day: 0.50    Years: 15.00    Total pack years: 7.50    Types: Cigarettes    Quit date: 2014    Years since quitting: 9.5   Smokeless tobacco: Never  Vaping Use   Vaping Use: Never used  Substance and Sexual Activity   Alcohol use: No    Alcohol/week: 0.0 standard drinks of alcohol   Drug use: No   Sexual activity: Not Currently  Other Topics Concern   Not on file  Social History Narrative   Not on file   Social Determinants of Health   Financial Resource Strain: Not on file  Food Insecurity: Not on file  Transportation Needs: Not on file  Physical Activity: Not on file  Stress: Not on file  Social Connections: Not on file  Intimate Partner Violence: Not on file     Initial impression:  This patient presents to the ED for concern  of cough and shortness of breath, this involves an extensive number of treatment options,  and is a complaint that carries with it a high risk of complications and morbidity.   Differentials include pneumonia, CHF exacerbation, URI .   Comorbidities affecting care:  Per HPI  Additional history obtained: Admission records, previous labs, previous imaging  Lab Tests  I Ordered, reviewed, and interpreted labs and EKG.  The pertinent results include:  No leukocytosis Creatinine 10.97, due for dialysis tomorrow  Imaging Studies ordered:  I ordered imaging studies including  chest x-ray with very mild pulmonary edema, unchanged from previous I independently visualized and interpreted imaging and I agree with the radiologist interpretation.   EKG: A-fib  Cardiac Monitoring:  The patient was maintained on a cardiac monitor.  I personally viewed and interpreted the cardiac monitored which showed an underlying rhythm of: A-fib   Medicines ordered and prescription drug management:  I ordered medication including: DuoNeb Reevaluation of the patient after these medicines showed that the patient improved I have reviewed the patients home medicines and have made adjustments as needed   ED Course/Re-evaluation: 61 year old male presents emergency department for evaluation of coughing and shortness of breath starting 2 days ago after completing antibiotics for necrotizing pneumonia.  Vitals are without significant abnormality.  Although he has frequent wet sounding coughing fits during my exam, they are nonproductive and he is satting on room air at above 93%.  Chest x-ray identified trace pleural effusions and possible bilateral consolidations, largely unchanged from previous in the last couple of weeks. Pt was given duoneb with some symptomatic improvement. On reevaluation, he is still maintaining O2 sats >94% on room air. He had several witnessed coughing fits where he is still maintained O2 saturations above 94%.  I discussed this case with my attending Dr. Almyra Free who given patient's  clinical presentation, labs and imaging who agrees that patient likely does not require admission at this time.  I will start patient back on Augmentin and doxycycline for an additional 5 days, and I advised him to reassess how he feels after dialysis tomorrow.  He also has a follow-up appointment with his PCP in 2 days.  We discussed return precautions.  Patient expresses understanding is amenable to plan.  Disposition:  After consideration of the diagnostic results, physical exam, history and the patients response to treatment feel that the patent would benefit from discharge.   Subacute cough Shortness of breath: Plan and management as described above. Discharged home in good condition. Final Clinical Impression(s) / ED Diagnoses Final diagnoses:  Subacute cough  Shortness of breath    Rx / DC Orders ED Discharge Orders          Ordered    amoxicillin-clavulanate (AUGMENTIN) 875-125 MG tablet  Every 12 hours        07/10/21 1134    doxycycline (VIBRAMYCIN) 100 MG capsule  2 times daily        07/10/21 1134              Tonye Pearson, [place] 07/10/21 1310    Luna Fuse, MD 07/20/21 0010

## 2021-07-18 ENCOUNTER — Ambulatory Visit: Payer: Medicare Other | Attending: Nurse Practitioner | Admitting: Nurse Practitioner

## 2021-07-18 DIAGNOSIS — Z8673 Personal history of transient ischemic attack (TIA), and cerebral infarction without residual deficits: Secondary | ICD-10-CM

## 2021-07-18 NOTE — Progress Notes (Unsigned)
    Postoperative Access Visit   History of Present Illness   TEAGUE GOYNES is a 61 y.o. year old male who presents for postoperative follow-up for:  Left arm brachiocephalic fistula ligation and Left IJ 23 cm tunneled dialysis catheter placement 06/20/21 by Dr. Virl Cagey. This was performed secondary to extensive bleeding from his fistula. He returns to day for post operative follow up and evaluation for new access.The patient's wounds are *** healed.  The patient notes *** steal symptoms.   He currently dialyzes via left IJ TDC on *** at ***   Physical Examination  There were no vitals filed for this visit. There is no height or weight on file to calculate BMI.  left arm Incision is *** healed, *** radial pulse, hand grip is ***/5, sensation in digits is *** intact, ***palpable thrill, bruit can *** be auscultated     Non invasive vascular lab studies:   Medical Decision Making   ENOS MUHL is a 61 y.o. year old male who presents s/p Left arm brachiocephalic fistula ligation and Left IJ 23 cm tunneled dialysis catheter placement 06/20/21 by Dr. Virl Cagey. This was performed secondary to extensive bleeding from his fistula. Vein mapping today shows ***  Patent *** without signs or symptoms of steal syndrome   Karoline Caldwell, PA-C [facility] Office: [facility] MD: Virl Cagey

## 2021-07-18 NOTE — Progress Notes (Signed)
NO answer. LVM x2. Will need office visit for rollator.

## 2021-07-19 ENCOUNTER — Other Ambulatory Visit: Payer: Self-pay

## 2021-07-19 DIAGNOSIS — N186 End stage renal disease: Secondary | ICD-10-CM

## 2021-07-21 ENCOUNTER — Other Ambulatory Visit (HOSPITAL_COMMUNITY): Payer: Medicare Other

## 2021-07-21 ENCOUNTER — Encounter (HOSPITAL_COMMUNITY): Payer: Medicare Other

## 2021-07-21 DIAGNOSIS — N186 End stage renal disease: Secondary | ICD-10-CM

## 2021-07-24 ENCOUNTER — Telehealth: Payer: Self-pay | Admitting: Nurse Practitioner

## 2021-07-24 NOTE — Telephone Encounter (Signed)
Home Health Verbal Orders - Caller/Agency: [facility] Number: [phone] Requesting PT for discharge visit Frequency:1 wk for just this week  Would like order as soon as possible. Please assist further

## 2021-07-25 NOTE — Telephone Encounter (Signed)
I spoke to [facility], a visit note documenting the need for the rollator is still needed to process the order.  The patient cancelled his appointment with PCP on 07/07/2021 and has an upcoming appointment 08/09/2021.

## 2021-07-25 NOTE — Telephone Encounter (Signed)
Nathaniel White returned her call for status update, please advise

## 2021-07-28 NOTE — Telephone Encounter (Signed)
Verbal orders given to patient.

## 2021-08-03 NOTE — Telephone Encounter (Signed)
Orders has been received and PCP will sign off once she returns to office on Monday.

## 2021-08-03 NOTE — Telephone Encounter (Signed)
Order number: [id number]

## 2021-08-03 NOTE — Telephone Encounter (Signed)
[facility] called to report that they're missing signed orders from PCP [facility] called   Best contact: [phone] -needs signed and completed. They have faxed this order 3 times already.

## 2021-08-09 ENCOUNTER — Ambulatory Visit: Payer: Medicare Other | Attending: Nurse Practitioner | Admitting: Nurse Practitioner

## 2021-08-09 ENCOUNTER — Other Ambulatory Visit: Payer: Self-pay

## 2021-08-09 ENCOUNTER — Encounter: Payer: Self-pay | Admitting: Nurse Practitioner

## 2021-08-09 ENCOUNTER — Ambulatory Visit: Payer: Medicare Other | Admitting: Cardiovascular Disease

## 2021-08-09 VITALS — BP 128/71 | HR 89 | Temp 98.7°F

## 2021-08-09 DIAGNOSIS — E1122 Type 2 diabetes mellitus with diabetic chronic kidney disease: Secondary | ICD-10-CM | POA: Diagnosis not present

## 2021-08-09 DIAGNOSIS — Z7951 Long term (current) use of inhaled steroids: Secondary | ICD-10-CM | POA: Diagnosis not present

## 2021-08-09 DIAGNOSIS — Z992 Dependence on renal dialysis: Secondary | ICD-10-CM | POA: Diagnosis not present

## 2021-08-09 DIAGNOSIS — Z87891 Personal history of nicotine dependence: Secondary | ICD-10-CM | POA: Diagnosis not present

## 2021-08-09 DIAGNOSIS — E1151 Type 2 diabetes mellitus with diabetic peripheral angiopathy without gangrene: Secondary | ICD-10-CM | POA: Insufficient documentation

## 2021-08-09 DIAGNOSIS — N186 End stage renal disease: Secondary | ICD-10-CM | POA: Diagnosis not present

## 2021-08-09 DIAGNOSIS — I132 Hypertensive heart and chronic kidney disease with heart failure and with stage 5 chronic kidney disease, or end stage renal disease: Secondary | ICD-10-CM | POA: Insufficient documentation

## 2021-08-09 DIAGNOSIS — N289 Disorder of kidney and ureter, unspecified: Secondary | ICD-10-CM | POA: Diagnosis not present

## 2021-08-09 DIAGNOSIS — Z8673 Personal history of transient ischemic attack (TIA), and cerebral infarction without residual deficits: Secondary | ICD-10-CM | POA: Diagnosis not present

## 2021-08-09 DIAGNOSIS — I509 Heart failure, unspecified: Secondary | ICD-10-CM | POA: Diagnosis not present

## 2021-08-09 DIAGNOSIS — N2889 Other specified disorders of kidney and ureter: Secondary | ICD-10-CM

## 2021-08-09 DIAGNOSIS — D631 Anemia in chronic kidney disease: Secondary | ICD-10-CM | POA: Diagnosis not present

## 2021-08-09 DIAGNOSIS — Z940 Kidney transplant status: Secondary | ICD-10-CM | POA: Diagnosis not present

## 2021-08-09 DIAGNOSIS — J449 Chronic obstructive pulmonary disease, unspecified: Secondary | ICD-10-CM | POA: Insufficient documentation

## 2021-08-09 DIAGNOSIS — I69354 Hemiplegia and hemiparesis following cerebral infarction affecting left non-dominant side: Secondary | ICD-10-CM | POA: Diagnosis present

## 2021-08-09 DIAGNOSIS — Z7901 Long term (current) use of anticoagulants: Secondary | ICD-10-CM | POA: Diagnosis not present

## 2021-08-09 DIAGNOSIS — F419 Anxiety disorder, unspecified: Secondary | ICD-10-CM | POA: Insufficient documentation

## 2021-08-09 DIAGNOSIS — I4891 Unspecified atrial fibrillation: Secondary | ICD-10-CM | POA: Insufficient documentation

## 2021-08-09 DIAGNOSIS — J984 Other disorders of lung: Secondary | ICD-10-CM

## 2021-08-09 MED ORDER — ALBUTEROL SULFATE HFA 108 (90 BASE) MCG/ACT IN AERS
2.0000 | INHALATION_SPRAY | Freq: Four times a day (QID) | RESPIRATORY_TRACT | 2 refills | Status: DC | PRN
  Filled 2021-08-09 – 2021-08-24 (×2): qty 8.5, 25d supply, fill #0

## 2021-08-09 MED ORDER — ALBUTEROL SULFATE (2.5 MG/3ML) 0.083% IN NEBU
2.5000 mg | INHALATION_SOLUTION | Freq: Four times a day (QID) | RESPIRATORY_TRACT | 1 refills | Status: DC | PRN
  Filled 2021-08-09 – 2021-08-24 (×2): qty 150, 13d supply, fill #0

## 2021-08-09 MED ORDER — FLUTICASONE-SALMETEROL 250-50 MCG/ACT IN AEPB
1.0000 | INHALATION_SPRAY | Freq: Two times a day (BID) | RESPIRATORY_TRACT | 6 refills | Status: DC
  Filled 2021-08-09: qty 60, 30d supply, fill #0
  Filled 2021-08-09: qty 180, 90d supply, fill #0

## 2021-08-09 NOTE — Patient Instructions (Addendum)
[facility] in [place], [place] Address: [address], [place], [place] [address] Phone: [phone]

## 2021-08-09 NOTE — Progress Notes (Deleted)
No chief complaint on file.  History of Present Illness: 61 yo male with history of atrial fibrillation, mitral regurgitation, HTN, CVA, DM, COPD, cardiomyopathy, PAD, former tobacco abuse and ESRD on HD here today for follow up. He is known to have a non-ischemic cardiomyopathy with LVEF=30% by TEE in 2013. Most recent echo March 2023 with LVEF=35%, moderate RV dysfunction, moderate MR. He is ESRD and had a failed kidney transplant at [facility] in the past. Cardiac catheterization at [facility] October 2015 with no evidence of CAD. PAD followed in [facility] by Dr. Donzetta Matters. Left popliteal artery stent July 2020. Left second toe amputation October 2020. He was admitted to [facility] March 2023 for revision of his AV fistula which was complicated by hypotension and respiratory failure requiring intubation. Episode thought to be due to vasoplegia in the setting of underlying ESRD. Echo 03/13/21 with LVEF 35% (from 40-45%), with moderate RV dysfunction, severe PHTN, moderate MR, RAP 23mHg. Also noted to have new onset atrial fibrillation. He was unable to tolerate GDMT due to hypotension and ESRD.  He was started on Eliquis 2.5 mg twice daily given history of GI bleeding and thrombocytopenia.     He is here today for follow up. The patient denies any chest pain, dyspnea, palpitations, lower extremity edema, orthopnea, PND, dizziness, near syncope or syncope.    Primary Care Physician: FGildardo Pounds NP  Past Medical History:  Diagnosis Date   Anemia    Anxiety    Asthma    CHF (congestive heart failure) ([place]    Complication from renal dialysis device 150/35/4656  Complication of anesthesia 2017   according to pt and spouse pt was moving around and cough while under and pt had difficulty waking up so the anesthesia had to be reversed. and pt admitted to ICU.    COPD (chronic obstructive pulmonary disease) (HCC)    Diabetes mellitus without complication ([place]    Type II - Patient states he does not have diabetes, "they  said sometimes when you start dialysis you don't have diabetes any longer"    ESOPHAGEAL VARICES 10/04/2008   Qualifier: Diagnosis of  By: SFuller PlanMD FLamont SnowballT    ESRD    on HD, [facility]-Sat - [place]   Hemiparesis due to old stroke ([facility]    left   Hypertension    Hx, not current problems, no meds   LV dysfunction    EF 25-30% by echo 07/2011   Memory loss due to medical condition    due to stroke   MR (mitral regurgitation)    moderate to severe, echo 07/2011   Peripheral vascular disease ([place]    Shortness of breath    with exertion   [place]    TIA's-left sided weakness   Tobacco abuse     Past Surgical History:  Procedure Laterality Date   ABDOMINAL AORTOGRAM W/LOWER EXTREMITY Bilateral 07/21/2018   Procedure: ABDOMINAL AORTOGRAM W/LOWER EXTREMITY;  Surgeon: CWaynetta Sandy MD;  [place];  Service: Cardiovascular;  Laterality: Bilateral;   AMPUTATION Left 11/05/2018   Procedure: AMPUTATION SECOND TOE LEFT FOOT;  Surgeon: CWaynetta Sandy MD;  [place]  Service: Vascular;  Laterality: Left;   AMPUTATION Left 02/13/2021   Procedure: Left small finger AMPUTATION DIGIT;  Surgeon: BSherilyn Cooter MD;  [place]  Service: Orthopedics;  Laterality: Left;   ABelfontemedical  COLONOSCOPY W/ BIOPSIES AND POLYPECTOMY     FISTULA SUPERFICIALIZATION Left 11/10/2013   Procedure: FISTULA PLICATION;  Surgeon: Serafina Mitchell, MD;  Location: [place];  Service: Vascular;  Laterality: Left;   INSERTION OF DIALYSIS CATHETER N/A 06/20/2021   Procedure: INSERTION OF DIALYSIS CATHETER;  Surgeon: Broadus John, MD;  Location: [facility] OR;  Service: Vascular;  Laterality: N/A;   KIDNEY TRANSPLANT     2011 rejected kidney 2012 back on dialysis   LEFT AND RIGHT HEART CATHETERIZATION WITH CORONARY ANGIOGRAM N/A 10/09/2013   Procedure: LEFT AND RIGHT HEART CATHETERIZATION WITH CORONARY  ANGIOGRAM;  Surgeon: Burnell Blanks, MD;  Location: [facility] CATH LAB;  Service: Cardiovascular;  Laterality: N/A;   LIGATION OF ARTERIOVENOUS  FISTULA Left 06/20/2021   Procedure: LIGATION OF ARTERIOVENOUS  FISTULA;  Surgeon: Broadus John, MD;  Location: [place];  Service: Vascular;  Laterality: Left;   PERIPHERAL VASCULAR INTERVENTION Left 07/21/2018   Procedure: PERIPHERAL VASCULAR INTERVENTION;  Surgeon: Waynetta Sandy, MD;  Location: [place] CV LAB;  Service: Cardiovascular;  Laterality: Left;  Popliteal   REVISON OF ARTERIOVENOUS FISTULA Left 08/26/2013   Procedure: EXCISION OF ERODED SKIN AND EXPLORATION OF MAIN LEFT UPPER ARM AV FISTULA;  Surgeon: Rosetta Posner, MD;  Location: [place];  Service: Vascular;  Laterality: Left;   REVISON OF ARTERIOVENOUS FISTULA Left 02/05/2014   Procedure: REPAIR OF ARTERIOVENOUS FISTULA ANEURYSM;  Surgeon: Serafina Mitchell, MD;  Location: [facility] OR;  Service: Vascular;  Laterality: Left;   REVISON OF ARTERIOVENOUS FISTULA Left 03/10/2021   Procedure: LEFT ARM REVISION OF ARTERIOVENOUS FISTULA WITH PLICATION;  Surgeon: Waynetta Sandy, MD;  Location: [place];  Service: Vascular;  Laterality: Left;   SHUNTOGRAM N/A 11/05/2012   Procedure: Fistulogram;  Surgeon: Serafina Mitchell, MD;  Location: [facility] C[facility] LAB;  Service: Cardiovascular;  Laterality: N/A;   TEE WITHOUT CARDIOVERSION  08/17/2011   Procedure: TRANSESOPHAGEAL ECHOCARDIOGRAM (TEE);  Surgeon: Larey Dresser, MD;  Location: [place];  Service: Cardiovascular;  Laterality: N/A;   ULTRASOUND GUIDANCE FOR VASCULAR ACCESS  06/20/2021   Procedure: ULTRASOUND GUIDANCE FOR VASCULAR ACCESS;  Surgeon: Broadus John, MD;  Location: [place];  Service: Vascular;;   VIDEO ASSISTED THORACOSCOPY (VATS)/DECORTICATION  08/10/11    Current Outpatient Medications  Medication Sig Dispense Refill   acetaminophen (TYLENOL) 500 MG tablet Take 1 tablet (500 mg total) by mouth every 6 (six) hours as needed.  (Patient taking differently: Take 500 mg by mouth every 6 (six) hours as needed for mild pain or headache.) 30 tablet 0   albuterol (PROAIR HFA) 108 (90 Base) MCG/ACT inhaler Inhale 2 puffs into the lungs every 6 (six) hours as needed for wheezing or shortness of breath. 8.5 g 2   apixaban (ELIQUIS) 2.5 MG TABS tablet Take 1 tablet (2.5 mg total) by mouth 2 (two) times daily. Hold your eliquis if your platelets are over 50.  You should follow up with cardiology and hematology about your blood thinners. 60 tablet 5   B Complex-C-Folic Acid (DIALYVITE 825) 0.8 MG TABS Take 1 tablet by mouth daily.     docusate sodium (COLACE) 100 MG capsule Take 1 capsule (100 mg total) by mouth 2 (two) times daily. (Patient taking differently: Take 100 mg by mouth 2 (two) times daily as needed for mild constipation.) 10 capsule 0   fluticasone-salmeterol (ADVAIR DISKUS) 250-50 MCG/ACT AEPB Inhale 1 puff into the lungs in the morning and at bedtime. 180 each 6  glucose blood test strip      hydrOXYzine (ATARAX) 10 MG tablet Take 1 tablet (10 mg total) by mouth 3 (three) times daily as needed. For anxiety (Patient taking differently: Take 10 mg by mouth 3 (three) times daily as needed for anxiety.) 60 tablet 0   midodrine (PROAMATINE) 10 MG tablet Place 1 tablet (10 mg total) into feeding tube 3 (three) times daily with meals. (Patient taking differently: Take 10 mg by mouth 3 (three) times daily with meals.) 90 tablet 0   montelukast (SINGULAIR) 10 MG tablet Take 1 tablet (10 mg total) by mouth at bedtime. 90 tablet 3   pantoprazole (PROTONIX) 40 MG tablet Take 1 tablet (40 mg total) by mouth daily. 30 tablet 1   VELPHORO 500 MG chewable tablet Chew 1,000 mg by mouth 2 (two) times daily after a meal.     VISINE ADVANCED RELIEF 0.05-0.1-1-1 % SOLN Place 1 drop into both eyes 2 (two) times daily as needed (for dryness).     No current facility-administered medications for this visit.    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  Tobacco Use   Smoking status: Former    Packs/day: 0.50    Years: 15.00    Total pack years: 7.50    Types: Cigarettes    Quit date: 2014    Years since quitting: 9.5   Smokeless tobacco: Never  Vaping Use   Vaping Use: Never used  Substance and Sexual Activity   Alcohol use: No    Alcohol/week: 0.0 standard drinks of alcohol   Drug use: No   Sexual activity: Not Currently  Other Topics Concern   Not on file  Social History Narrative   Not on file   Social Determinants of Health   Financial Resource Strain: Not on file  Food Insecurity: Not on file  Transportation Needs: Not on file  Physical Activity: Not on file  Stress: Not on file  Social Connections: Not on file  Intimate Partner Violence: Not on file    Family History  Problem Relation Age of Onset   Hypertension Mother    Varicose Veins Mother    Diabetes Paternal Grandmother    Cancer Paternal Grandfather    CAD Paternal Uncle     Review of Systems:  As stated in the HPI and otherwise negative.   There were no vitals taken for this visit.  Physical Examination: General: Well developed, well nourished, NAD  HEENT: OP clear, mucus membranes moist  SKIN: warm, dry. No rashes. Neuro: No focal deficits  Musculoskeletal: Muscle strength 5/5 all ext  Psychiatric: Mood and affect normal  Neck: No JVD, no carotid bruits, no thyromegaly, no lymphadenopathy.  Lungs:Clear bilaterally, no wheezes, rhonci, crackles Cardiovascular: Regular rate and rhythm. No murmurs,  gallops or rubs. Abdomen:Soft. Bowel sounds present. Non-tender.  Extremities: No lower extremity edema. Pulses are 2 + in the bilateral DP/PT.  EKG:  EKG is *** ordered today. The ekg ordered today demonstrates   Echo March 2023:  1. Left ventricular ejection fraction, by estimation, is 35%. The left  ventricle demonstrates global hypokinesis. Left ventricular diastolic  parameters are indeterminate.   2. Right ventricular systolic function is moderately reduced. The right  ventricular size is moderately enlarged. There is severely elevated  pulmonary artery systolic pressure. The estimated right ventricular  systolic pressure is 02.7 mmHg.   3. Left atrial size was moderately dilated.   4. Right atrial size was mildly dilated.   5. The mitral valve is degenerative. Moderate mitral valve regurgitation.  No evidence of mitral stenosis.   6. Tricuspid valve regurgitation is moderate.   7. The aortic valve is tricuspid. There is mild calcification of the  aortic valve. Aortic valve sclerosis/calcification is present, without any  evidence of aortic stenosis.   8. The inferior vena cava is dilated in size with <50% respiratory  variability, suggesting right atrial pressure of 15 mmHg.   Recent Labs: 06/20/2021: B Natriuretic Peptide 2,645.2 06/26/2021: Magnesium 2.0 07/10/2021: ALT 9; BUN 94; Creatinine, Ser 10.97; Hemoglobin 9.5; Platelets 109; Potassium 4.5; Sodium 140   Lipid Panel    Component Value Date/Time   CHOL 130 06/07/2021 1621   TRIG 58 06/07/2021 1621   HDL 58 06/07/2021 1621   CHOLHDL 2.2 06/07/2021 1621   CHOLHDL 2.4 08/14/2011 0400   VLDL 14 08/14/2011 0400   LDLCALC 59 06/07/2021 1621     Wt Readings from Last 3 Encounters:  07/10/21 167 lb 8.8 oz (76 kg)  06/26/21 168 lb 6.9 oz (76.4 kg)  05/17/21 167 lb (75.8 kg)      Assessment and Plan:   1. Mitral regurgitation: Moderate by echo in March 2023.   2. Non-ischemic cardiomyopathy: LVEF=35% by echo  in 2023. He has not tolerate GDMT due to hypotension with HD. I do not think an ischemic evaluation is indicated.   3. RV failure/Severe pulmonary HTN: Moderate RV systolic dysfunction on echo in March 2023 with estimated RVSP of 78 mmHg. Volume management by HD  4. Atrial fibrillation: Rate controlled today on no AV nodal blocking agents. Tolerating Eliquis. CHADS VASC score of 5.   Current medicines are reviewed at length with the patient today.  The patient does not have concerns regarding medicines.  The following changes have been made:  no change  Labs/ tests ordered today include:   No orders of the defined types were placed in this encounter.    Disposition:   FU with me in 12 months.    Signed, Lauree Chandler, MD 08/09/2021 9:54 AM    [facility] [address] [place],   [address] Phone: [phone]; Fax: [phone]

## 2021-08-09 NOTE — Progress Notes (Signed)
Pt still has cough.  Medication review.

## 2021-08-09 NOTE — Progress Notes (Unsigned)
Assessment & Plan:  Lavel was seen today for hospitalization follow-up.  Diagnoses and all orders for this visit:  History of CVA (cerebrovascular accident) -     For home use only DME 4 wheeled rolling walker with seat ([id number])  Chronic obstructive pulmonary disease, unspecified COPD type ([place]) -     albuterol (PROAIR HFA) 108 (90 Base) MCG/ACT inhaler; Inhale 2 puffs into the lungs every 6 (six) hours as needed for wheezing or shortness of breath. -     fluticasone-salmeterol (ADVAIR DISKUS) 250-50 MCG/ACT AEPB; Inhale 1 puff into the lungs in the morning and at bedtime. NEEDS PASS  Other specified disorders of kidney and ureter -     MR ABDOMEN WO CONTRAST; Future  Kidney lesion -     MR ABDOMEN WO CONTRAST; Future  Restrictive lung disease -     For home use only DME Nebulizer machine -     albuterol (PROVENTIL) (2.5 MG/3ML) 0.083% nebulizer solution; Take 3 mLs (2.5 mg total) by nebulization every 6 (six) hours as needed for wheezing or shortness of breath.    Patient has been counseled on age-appropriate routine health concerns for screening and prevention. These are reviewed and up-to-date. Referrals have been placed accordingly. Immunizations are up-to-date or declined.    Subjective:   Chief Complaint  Patient presents with   Hospitalization Follow-up   HPI Nathaniel White 61 y.o. male presents to office today for [place]. He is accompanied by his roommate.  Using a wheelchair due to decreased mobility, shortness of breath with exertion and history of stroke with residual left sided weakness and LE edema. He is requesting a rolling walker to assist with mobility.   Stopped smoking 5 years ago.  He has a past medical history of Anemia, Afib (taking Eliquis), Thrombocytopenia, Anxiety, Asthma, CHF, Complication from renal dialysis device (11/02/2013),  COPD, DM 2,  ESOPHAGEAL VARICES (10/04/2008), ESRD, Failed renal transplant in 2010, Left sided Hemiparesis due to old  stroke, Hypertension, LV dysfunction, Memory loss due to medical condition, MR, Peripheral vascular disease, NICM with LVEF 30-35%,  He has questions about what medications he should be taking and what they are for. We spent 15 minutes discussing his medications. His eliquis bottle is empty. I have instructed him that it is very important he picks up his refills on a timely basis and how not taking eliquis can cause stroke or heart attack.    HFU Presented to ED on 07-10-2021 with worsening cough and shortness of breath. Prior to this ED visit he had been dc'd from the hospital on 06-26-2021 after a 6 day stay for bleeding fistula and necrotizing PNA.  Chest x-ray on 07-10-2021 identified trace pleural effusions and possible bilateral consolidations, largely unchanged from previous in the last couple of weeks. He was treated with duonebs and restarted on augmentin and doxy for an additional 5 days after previously being treated with same abx in June. Today he states cough is persistent and keeps him up at night. I have referred him to [facility]mon[facility] however they have been unable to contact him as he has not been answering his phone and is unable to check voicemails. I did give him the number today for pulmonology to schedule. He has been out of his inhalers and has not been taking singulair. He does have a history of asthma and nebulizer machine was ordered for him today.   Review of Systems  Constitutional:  Negative for fever, malaise/fatigue and weight loss.  HENT: Negative.  Negative for nosebleeds.   Eyes: Negative.  Negative for blurred vision, double vision and photophobia.  Respiratory:  Positive for cough, sputum production and shortness of breath. Negative for hemoptysis and wheezing.   Cardiovascular:  Positive for leg swelling. Negative for chest pain and palpitations.  Gastrointestinal: Negative.  Negative for heartburn, nausea and vomiting.  Musculoskeletal: Negative.  Negative for myalgias.   Neurological:  Positive for weakness. Negative for dizziness, focal weakness, seizures and headaches.  Psychiatric/Behavioral: Negative.  Negative for suicidal ideas.     Past Medical History:  Diagnosis Date   Anemia    Anxiety    Asthma    CHF (congestive heart failure) ([place])    Complication from renal dialysis device 31/54/0086   Complication of anesthesia 2017   according to pt and spouse pt was moving around and cough while under and pt had difficulty waking up so the anesthesia had to be reversed. and pt admitted to ICU.    COPD (chronic obstructive pulmonary disease) ([facility])    Diabetes mellitus without complication ([place])    Type II - Patient states he does not have diabetes, "they said sometimes when you start dialysis you don't have diabetes any longer"    ESOPHAGEAL VARICES 10/04/2008   Qualifier: Diagnosis of  By: Fuller Plan MD Lamont Snowball T    ESRD    on HD, T-TH-Sat - [place]   Hemiparesis due to old stroke [facility])    left   Hypertension    Hx, not current problems, no meds   LV dysfunction    EF 25-30% by echo 07/2011   Memory loss due to medical condition    due to stroke   MR (mitral regurgitation)    moderate to severe, echo 07/2011   Peripheral vascular disease ([place])    Shortness of breath    with exertion   Stroke ([place])    TIA's-left sided weakness   Tobacco abuse     Past Surgical History:  Procedure Laterality Date   ABDOMINAL AORTOGRAM W/LOWER EXTREMITY Bilateral 07/21/2018   Procedure: ABDOMINAL AORTOGRAM W/LOWER EXTREMITY;  Surgeon: Waynetta Sandy, MD;  Location: [place] CV LAB;  Service: Cardiovascular;  Laterality: Bilateral;   AMPUTATION Left 11/05/2018   Procedure: AMPUTATION SECOND TOE LEFT FOOT;  Surgeon: Waynetta Sandy, MD;  Location: [place];  Service: Vascular;  Laterality: Left;   AMPUTATION Left 02/13/2021   Procedure: Left small finger AMPUTATION DIGIT;  Surgeon: Sherilyn Cooter, MD;  Location: [place];  Service:  Orthopedics;  Laterality: Left;   AV FISTULA PLACEMENT     CARDIAC CATHETERIZATION      medical   COLONOSCOPY W/ BIOPSIES AND POLYPECTOMY     FISTULA SUPERFICIALIZATION Left 11/10/2013   Procedure: FISTULA PLICATION;  Surgeon: Serafina Mitchell, MD;  Location: [place];  Service: Vascular;  Laterality: Left;   INSERTION OF DIALYSIS CATHETER N/A 06/20/2021   Procedure: INSERTION OF DIALYSIS CATHETER;  Surgeon: Broadus John, MD;  Location: [facility] OR;  Service: Vascular;  Laterality: N/A;   KIDNEY TRANSPLANT     2011 rejected kidney 2012 back on dialysis   LEFT AND RIGHT HEART CATHETERIZATION WITH CORONARY ANGIOGRAM N/A 10/09/2013   Procedure: LEFT AND RIGHT HEART CATHETERIZATION WITH CORONARY ANGIOGRAM;  Surgeon: Burnell Blanks, MD;  Location: [facility] CATH LAB;  Service: Cardiovascular;  Laterality: N/A;   LIGATION OF ARTERIOVENOUS  FISTULA Left 06/20/2021   Procedure: LIGATION OF ARTERIOVENOUS  FISTULA;  Surgeon: Broadus John, MD;  Location: [facility] OR;  Service: Vascular;  Laterality: Left;   PERIPHERAL VASCULAR INTERVENTION Left 07/21/2018   Procedure: PERIPHERAL VASCULAR INTERVENTION;  Surgeon: Waynetta Sandy, MD;  Location: [place] CV LAB;  Service: Cardiovascular;  Laterality: Left;  Popliteal   REVISON OF ARTERIOVENOUS FISTULA Left 08/26/2013   Procedure: EXCISION OF ERODED SKIN AND EXPLORATION OF MAIN LEFT UPPER ARM AV FISTULA;  Surgeon: Rosetta Posner, MD;  Location: [place];  Service: Vascular;  Laterality: Left;   REVISON OF ARTERIOVENOUS FISTULA Left 02/05/2014   Procedure: REPAIR OF ARTERIOVENOUS FISTULA ANEURYSM;  Surgeon: Serafina Mitchell, MD;  Location: [facility] OR;  Service: Vascular;  Laterality: Left;   REVISON OF ARTERIOVENOUS FISTULA Left 03/10/2021   Procedure: LEFT ARM REVISION OF ARTERIOVENOUS FISTULA WITH PLICATION;  Surgeon: Waynetta Sandy, MD;  Location: [place];  Service: Vascular;  Laterality: Left;   SHUNTOGRAM N/A 11/05/2012   Procedure: Fistulogram;   Surgeon: Serafina Mitchell, MD;  Location: [facility] CATH LAB;  Service: Cardiovascular;  Laterality: N/A;   TEE WITHOUT CARDIOVERSION  08/17/2011   Procedure: TRANSESOPHAGEAL ECHOCARDIOGRAM (TEE);  Surgeon: Larey Dresser, MD;  Location: [place];  Service: Cardiovascular;  Laterality: N/A;   ULTRASOUND GUIDANCE FOR VASCULAR ACCESS  06/20/2021   Procedure: ULTRASOUND GUIDANCE FOR VASCULAR ACCESS;  Surgeon: Broadus John, MD;  Location: [facility] OR;  Service: Vascular;;   VIDEO ASSISTED THORACOSCOPY (VATS)/DECORTICATION  08/10/11    Family History  Problem Relation Age of Onset   Hypertension Mother    Varicose Veins Mother    Diabetes Paternal Grandmother    Cancer Paternal Grandfather    CAD Paternal Uncle     Social History Reviewed with no changes to be made today.   Outpatient Medications Prior to Visit  Medication Sig Dispense Refill   apixaban (ELIQUIS) 2.5 MG TABS tablet Take 1 tablet (2.5 mg total) by mouth 2 (two) times daily. Hold your eliquis if your platelets are over 50.  You should follow up with cardiology and hematology about your blood thinners. 60 tablet 5   glucose blood test strip      hydrOXYzine (ATARAX) 10 MG tablet Take 1 tablet (10 mg total) by mouth 3 (three) times daily as needed. For anxiety (Patient taking differently: Take 10 mg by mouth 3 (three) times daily as needed for anxiety.) 60 tablet 0   midodrine (PROAMATINE) 10 MG tablet Place 1 tablet (10 mg total) into feeding tube 3 (three) times daily with meals. (Patient taking differently: Take 10 mg by mouth 3 (three) times daily with meals.) 90 tablet 0   montelukast (SINGULAIR) 10 MG tablet Take 1 tablet (10 mg total) by mouth at bedtime. 90 tablet 3   VELPHORO 500 MG chewable tablet Chew 1,000 mg by mouth 2 (two) times daily after a meal.     albuterol (PROAIR HFA) 108 (90 Base) MCG/ACT inhaler Inhale 2 puffs into the lungs every 6 (six) hours as needed for wheezing or shortness of breath. 8.5 g 2    fluticasone-salmeterol (ADVAIR DISKUS) 250-50 MCG/ACT AEPB Inhale 1 puff into the lungs in the morning and at bedtime. 180 each 6   acetaminophen (TYLENOL) 500 MG tablet Take 1 tablet (500 mg total) by mouth every 6 (six) hours as needed. (Patient not taking: Reported on 08/09/2021) 30 tablet 0   B Complex-C-Folic Acid (DIALYVITE 885) 0.8 MG TABS Take 1 tablet by mouth daily. (Patient not taking: Reported on 08/09/2021)     docusate sodium (COLACE) 100 MG  capsule Take 1 capsule (100 mg total) by mouth 2 (two) times daily. (Patient not taking: Reported on 08/09/2021) 10 capsule 0   pantoprazole (PROTONIX) 40 MG tablet Take 1 tablet (40 mg total) by mouth daily. (Patient not taking: Reported on 08/09/2021) 30 tablet 1   VISINE ADVANCED RELIEF 0.05-0.1-1-1 % SOLN Place 1 drop into both eyes 2 (two) times daily as needed (for dryness). (Patient not taking: Reported on 08/09/2021)     No facility-administered medications prior to visit.    Allergies  Allergen Reactions   Codeine Shortness Of Breath   Dextromethorphan-Guaifenesin Shortness Of Breath   Iron Dextran Shortness Of Breath   Morphine And Related Shortness Of Breath       Objective:    BP 128/71   Pulse 89   Temp 98.7 F (37.1 C) (Oral)   SpO2 95%  Wt Readings from Last 3 Encounters:  07/10/21 167 lb 8.8 oz (76 kg)  06/26/21 168 lb 6.9 oz (76.4 kg)  05/17/21 167 lb (75.8 kg)    Physical Exam Vitals and nursing note reviewed.  Constitutional:      Appearance: He is well-developed.  HENT:     Head: Normocephalic and atraumatic.  Cardiovascular:     Rate and Rhythm: Normal rate and regular rhythm.     Heart sounds: Normal heart sounds. No murmur heard.    No friction rub. No gallop.  Pulmonary:     Effort: Pulmonary effort is normal. No tachypnea or respiratory distress.     Breath sounds: Normal breath sounds. No decreased breath sounds, wheezing, rhonchi or rales.  Chest:     Chest wall: No tenderness.  Abdominal:      General: Bowel sounds are normal.     Palpations: Abdomen is soft.  Musculoskeletal:        General: Normal range of motion.     Cervical back: Normal range of motion.     Left lower leg: Swelling present. Edema present.     Left ankle: Swelling present.     Left foot: Swelling present.  Skin:    General: Skin is warm and dry.  Neurological:     Mental Status: He is alert and oriented to person, place, and time.     Motor: Weakness (left sided residual weakness.) present.     Coordination: Coordination normal.  Psychiatric:        Behavior: Behavior normal. Behavior is cooperative.        Thought Content: Thought content normal.        Judgment: Judgment normal.          Patient has been counseled extensively about nutrition and exercise as well as the importance of adherence with medications and regular follow-up. The patient was given clear instructions to go to ER or return to medical center if symptoms don't improve, worsen or new problems develop. The patient verbalized understanding.   Follow-up: Return in about 3 months (around 11/09/2021).   Gildardo Pounds, FNP-BC [facility] and [place], [place]   08/10/2021, 10:32 PM

## 2021-08-10 ENCOUNTER — Encounter: Payer: Self-pay | Admitting: Nurse Practitioner

## 2021-08-15 ENCOUNTER — Other Ambulatory Visit: Payer: Self-pay

## 2021-08-15 NOTE — Telephone Encounter (Signed)
[facility] returned call for update, they have yet to receive correspondence from PCP. The patient has been discharged as of 07/26/2021, that order has been faxed as well.

## 2021-08-17 NOTE — Telephone Encounter (Signed)
Please see Nathaniel White for this. I don't have any new paperwork for him at this time

## 2021-08-18 ENCOUNTER — Ambulatory Visit (INDEPENDENT_AMBULATORY_CARE_PROVIDER_SITE_OTHER): Payer: Medicare Other | Admitting: Physician Assistant

## 2021-08-18 ENCOUNTER — Ambulatory Visit (HOSPITAL_COMMUNITY)
Admission: RE | Admit: 2021-08-18 | Discharge: 2021-08-18 | Disposition: A | Discharge status: Home / Self Care | Payer: Medicare Other | Source: Ambulatory Visit | Attending: Physician Assistant | Admitting: Physician Assistant

## 2021-08-18 ENCOUNTER — Ambulatory Visit (INDEPENDENT_AMBULATORY_CARE_PROVIDER_SITE_OTHER)
Admission: RE | Admit: 2021-08-18 | Discharge: 2021-08-18 | Disposition: A | Discharge status: Home / Self Care | Payer: Medicare Other | Source: Ambulatory Visit | Attending: Vascular Surgery | Admitting: Vascular Surgery

## 2021-08-18 VITALS — BP 115/76 | HR 54 | Temp 97.7°F | Resp 16 | Ht 69.0 in | Wt 170.0 lb

## 2021-08-18 DIAGNOSIS — N186 End stage renal disease: Secondary | ICD-10-CM

## 2021-08-18 NOTE — Progress Notes (Signed)
[facility] [place] HISTORY AND PHYSICAL   History of Present Illness:  Patient is a 61 y.o. year old male who presents for placement of a permanent hemodialysis access. He is s/p   Left arm brachiocephalic fistula ligation and Left IJ 23 cm tunneled dialysis catheter placement 06/20/21 by Dr. Virl Cagey.   While being evaluated, his left arm fistula ruptured resulting in life-threatening hemorrhage.  He has maintained left UE stable that were removed today.  The left UE has healed well.    Past Medical History:  Diagnosis Date   Anemia    Anxiety    Asthma    CHF (congestive heart failure) ([place])    Complication from renal dialysis device 35/00/9381   Complication of anesthesia 2017   according to pt and spouse pt was moving around and cough while under and pt had difficulty waking up so the anesthesia had to be reversed. and pt admitted to ICU.    COPD (chronic obstructive pulmonary disease) (HCC)    Diabetes mellitus without complication ([place])    Type II - Patient states he does not have diabetes, "they said sometimes when you start dialysis you don't have diabetes any longer"    ESOPHAGEAL VARICES 10/04/2008   Qualifier: Diagnosis of  By: Fuller Plan MD Lamont Snowball T    ESRD    on HD, T-TH-Sat - [place]   Hemiparesis due to old stroke [facility])    left   Hypertension    Hx, not current problems, no meds   LV dysfunction    EF 25-30% by echo 07/2011   Memory loss due to medical condition    due to stroke   MR (mitral regurgitation)    moderate to severe, echo 07/2011   Peripheral vascular disease ([place])    Shortness of breath    with exertion   Stroke ([place])    TIA's-left sided weakness   Tobacco abuse     Past Surgical History:  Procedure Laterality Date   ABDOMINAL AORTOGRAM W/LOWER EXTREMITY Bilateral 07/21/2018   Procedure: ABDOMINAL AORTOGRAM W/LOWER EXTREMITY;  Surgeon: Waynetta Sandy, MD;  Location: [place] [facility];  Service: Cardiovascular;   Laterality: Bilateral;   AMPUTATION Left 11/05/2018   Procedure: AMPUTATION SECOND TOE LEFT FOOT;  Surgeon: Waynetta Sandy, MD;  Location: [place];  Service: Vascular;  Laterality: Left;   AMPUTATION Left 02/13/2021   Procedure: Left small finger AMPUTATION DIGIT;  Surgeon: Sherilyn Cooter, MD;  Location: [place];  Service: Orthopedics;  Laterality: Left;   AV FISTULA PLACEMENT     CARDIAC CATHETERIZATION     [facility]   COLONOSCOPY W/ BIOPSIES AND POLYPECTOMY     FISTULA SUPERFICIALIZATION Left 11/10/2013   Procedure: FISTULA PLICATION;  Surgeon: Serafina Mitchell, MD;  Location: [place];  Service: Vascular;  Laterality: Left;   INSERTION OF DIALYSIS CATHETER N/A 06/20/2021   Procedure: INSERTION OF DIALYSIS CATHETER;  Surgeon: Broadus John, MD;  Location: [facility] OR;  Service: Vascular;  Laterality: N/A;   KIDNEY TRANSPLANT     2011 rejected kidney 2012 back on dialysis   LEFT AND RIGHT HEART CATHETERIZATION WITH CORONARY ANGIOGRAM N/A 10/09/2013   Procedure: LEFT AND RIGHT HEART CATHETERIZATION WITH CORONARY ANGIOGRAM;  Surgeon: Burnell Blanks, MD;  Location: [facility] CATH LAB;  Service: Cardiovascular;  Laterality: N/A;   LIGATION OF ARTERIOVENOUS  FISTULA Left 06/20/2021   Procedure: LIGATION OF ARTERIOVENOUS  FISTULA;  Surgeon: Broadus John, MD;  Location: [place];  Service: Vascular;  Laterality: Left;   PERIPHERAL VASCULAR INTERVENTION Left 07/21/2018   Procedure: PERIPHERAL VASCULAR INTERVENTION;  Surgeon: Waynetta Sandy, MD;  Location: [place] [facility];  Service: Cardiovascular;  Laterality: Left;  Popliteal   REVISON OF ARTERIOVENOUS FISTULA Left 08/26/2013   Procedure: EXCISION OF ERODED SKIN AND EXPLORATION OF MAIN LEFT UPPER ARM AV FISTULA;  Surgeon: Rosetta Posner, MD;  Location: [place];  Service: Vascular;  Laterality: Left;   REVISON OF ARTERIOVENOUS FISTULA Left 02/05/2014   Procedure: REPAIR OF ARTERIOVENOUS FISTULA ANEURYSM;  Surgeon: Serafina Mitchell, MD;   Location: [facility] OR;  Service: Vascular;  Laterality: Left;   REVISON OF ARTERIOVENOUS FISTULA Left 03/10/2021   Procedure: LEFT ARM REVISION OF ARTERIOVENOUS FISTULA WITH PLICATION;  Surgeon: Waynetta Sandy, MD;  Location: [place];  Service: Vascular;  Laterality: Left;   SHUNTOGRAM N/A 11/05/2012   Procedure: Fistulogram;  Surgeon: Serafina Mitchell, MD;  Location: [facility] CATH LAB;  Service: Cardiovascular;  Laterality: N/A;   TEE WITHOUT CARDIOVERSION  08/17/2011   Procedure: TRANSESOPHAGEAL ECHOCARDIOGRAM (TEE);  Surgeon: Larey Dresser, MD;  Location: [facility] ENDOSCOPY;  Service: Cardiovascular;  Laterality: N/A;   ULTRASOUND GUIDANCE FOR VASCULAR ACCESS  06/20/2021   Procedure: ULTRASOUND GUIDANCE FOR VASCULAR ACCESS;  Surgeon: Broadus John, MD;  Location: [facility] OR;  Service: Vascular;;   VIDEO ASSISTED THORACOSCOPY (VATS)/DECORTICATION  08/10/11     Social History Social History   Tobacco Use   Smoking status: Former    Packs/day: 0.50    Years: 15.00    Total pack years: 7.50    Types: Cigarettes    Quit date: 2014    Years since quitting: 9.6   Smokeless tobacco: Never  Vaping Use   Vaping Use: Never used  Substance Use Topics   Alcohol use: No    Alcohol/week: 0.0 standard drinks of alcohol   Drug use: No    Family History Family History  Problem Relation Age of Onset   Hypertension Mother    Varicose Veins Mother    Diabetes Paternal Grandmother    Cancer Paternal Grandfather    CAD Paternal Uncle     Allergies  Allergies  Allergen Reactions   Codeine Shortness Of Breath   Dextromethorphan-Guaifenesin Shortness Of Breath   Iron Dextran Shortness Of Breath   Morphine And Related Shortness Of Breath     Current Outpatient Medications  Medication Sig Dispense Refill   acetaminophen (TYLENOL) 500 MG tablet Take 1 tablet (500 mg total) by mouth every 6 (six) hours as needed. (Patient not taking: Reported on 08/09/2021) 30 tablet 0   albuterol (PROAIR HFA) 108 (90  Base) MCG/ACT inhaler Inhale 2 puffs into the lungs every 6 (six) hours as needed for wheezing or shortness of breath. 18 g 2   albuterol (PROVENTIL) (2.5 MG/3ML) 0.083% nebulizer solution Take 3 mLs (2.5 mg total) by nebulization every 6 (six) hours as needed for wheezing or shortness of breath. 150 mL 1   apixaban (ELIQUIS) 2.5 MG TABS tablet Take 1 tablet (2.5 mg total) by mouth 2 (two) times daily. Hold your eliquis if your platelets are over 50.  You should follow up with cardiology and hematology about your blood thinners. 60 tablet 5   B Complex-C-Folic Acid (DIALYVITE 003) 0.8 MG TABS Take 1 tablet by mouth daily. (Patient not taking: Reported on 08/09/2021)     docusate sodium (COLACE) 100 MG capsule Take 1 capsule (100 mg total) by mouth 2 (two) times  daily. (Patient not taking: Reported on 08/09/2021) 10 capsule 0   fluticasone-salmeterol (ADVAIR DISKUS) 250-50 MCG/ACT AEPB Inhale 1 puff into the lungs in the morning and at bedtime. NEEDS PASS 180 each 6   glucose blood test strip      hydrOXYzine (ATARAX) 10 MG tablet Take 1 tablet (10 mg total) by mouth 3 (three) times daily as needed. For anxiety (Patient taking differently: Take 10 mg by mouth 3 (three) times daily as needed for anxiety.) 60 tablet 0   midodrine (PROAMATINE) 10 MG tablet Place 1 tablet (10 mg total) into feeding tube 3 (three) times daily with meals. (Patient taking differently: Take 10 mg by mouth 3 (three) times daily with meals.) 90 tablet 0   montelukast (SINGULAIR) 10 MG tablet Take 1 tablet (10 mg total) by mouth at bedtime. 90 tablet 3   pantoprazole (PROTONIX) 40 MG tablet Take 1 tablet (40 mg total) by mouth daily. (Patient not taking: Reported on 08/09/2021) 30 tablet 1   VELPHORO 500 MG chewable tablet Chew 1,000 mg by mouth 2 (two) times daily after a meal.     No current facility-administered medications for this visit.    ROS:   General:  No weight loss, Fever, chills  HEENT: No recent headaches, no nasal  bleeding, no visual changes, no sore throat  Neurologic: No dizziness, blackouts, seizures. No recent symptoms of stroke or mini- stroke. No recent episodes of slurred speech, or temporary blindness.  Cardiac: No recent episodes of chest pain/pressure, no shortness of breath at rest.  No shortness of breath with exertion.  Denies history of atrial fibrillation or irregular heartbeat  Vascular: No history of rest pain in feet.  No history of claudication.  No history of non-healing ulcer, No history of DVT   Pulmonary: No home oxygen, no productive cough, no hemoptysis,  No asthma or wheezing  Musculoskeletal:  '[ ]'$  Arthritis, '[ ]'$  Low back pain,  '[ ]'$  Joint pain  Hematologic:No history of hypercoagulable state.  No history of easy bleeding.  No history of anemia  Gastrointestinal: No hematochezia or melena,  No gastroesophageal reflux, no trouble swallowing  Urinary: '[ ]'$  chronic Kidney disease, [x ] on HD - '[ ]'$  MWF or [ x] TTHS, '[ ]'$  Burning with urination, '[ ]'$  Frequent urination, '[ ]'$  Difficulty urinating;   Skin: No rashes  Psychological: No history of anxiety,  No history of depression   Physical Examination  Vitals:   08/18/21 1525  BP: 115/76  Pulse: (!) 54  Resp: 16  Temp: 97.7 F (36.5 C)  TempSrc: Temporal  SpO2: 100%  Weight: 170 lb (77.1 kg)  Height: '5\' 9"'$  (1.753 m)    Body mass index is 25.1 kg/m.  General:  Alert and oriented, no acute distress HEENT: Normal Neck: No bruit or JVD Pulmonary: Clear to auscultation bilaterally Cardiac: Regular Rate and Rhythm without murmur Gastrointestinal: Soft, non-tender, non-distended, no mass, no scars Skin: No rash Left UE well healed incision.  Staples were removed today in office patient tolerated this well. Musculoskeletal: left UE with 5th digit tip amputation that has healed completely   DATA:      Right Pre-Dialysis Findings:  +-----------------------+----------+--------------------+---------+--------  +   Location               PSV (cm/s)Intralum. Diam. (cm)Waveform  Comments  +-----------------------+----------+--------------------+---------+--------  +  Brachial Antecub. fossa40        0.74  triphasic            +-----------------------+----------+--------------------+---------+--------  +  Radial Art at Wrist    56        0.26                triphasic            +-----------------------+----------+--------------------+---------+--------  +  Ulnar Art at Wrist     50        0.15                triphasic            +-----------------------+----------+--------------------+---------+--------  +     Left Pre-Dialysis Findings:  +-----------------------+----------+--------------------+----------+-------  -+  Location               PSV (cm/s)Intralum. Diam. (cm)Waveform    Comments  +-----------------------+----------+--------------------+----------+-------  -+  Brachial Antecub. fossa41        0.50                triphasic            +-----------------------+----------+--------------------+----------+-------  -+  Radial Art at Wrist    35        0.17                biphasic             +-----------------------+----------+--------------------+----------+-------  -+  Ulnar Art at Wrist     15        0.17                monophasicbrisk       +-----------------------+----------+--------------------+----------+-------  -+    +-----------------+-------------+----------+--------------+  Right Cephalic   Diameter (cm)Depth (cm)   Findings     +-----------------+-------------+----------+--------------+  Shoulder                                not visualized  +-----------------+-------------+----------+--------------+  Prox upper arm                          not visualized  +-----------------+-------------+----------+--------------+  Mid upper arm                           not visualized   +-----------------+-------------+----------+--------------+  Dist upper arm                          not visualized  +-----------------+-------------+----------+--------------+  Antecubital fossa    0.14               not visualized  +-----------------+-------------+----------+--------------+  Prox forearm                            not visualized  +-----------------+-------------+----------+--------------+  Mid forearm          0.11                   graft       +-----------------+-------------+----------+--------------+   +-----------------+-------------+----------+--------+  Right Basilic    Diameter (cm)Depth (cm)Findings  +-----------------+-------------+----------+--------+  Prox upper arm       0.53                         +-----------------+-------------+----------+--------+  Mid upper arm        0.45                         +-----------------+-------------+----------+--------+  Dist upper arm       0.24                         +-----------------+-------------+----------+--------+  Antecubital fossa    0.18                         +-----------------+-------------+----------+--------+  Prox forearm         0.18                         +-----------------+-------------+----------+--------+   ASSESSMENT/PLAN: ESRD with history of left UE access followed by  fistula ruptured resulting in life-threatening hemorrhage.  S/P left cephalic fistula ligation. Staples removed in office today.  He has an acceptable basilic vein on vein mapping to right UE.  Triphasic right UE arterial flow.  I will schedule him for right UE first stage Basilic fistula creation.  He has a working [facility].  Of note he is managed on Eliquis 2.5 mg BID this will need to be held pre-op.     Roxy Horseman PA-C [facility] [place] Office: [phone]  MD on call Virl Cagey

## 2021-08-22 ENCOUNTER — Telehealth: Payer: Self-pay

## 2021-08-22 NOTE — Telephone Encounter (Signed)
Nathaniel White from [place] is calling about recent paperwork that she faxed needing to be signed. I had her refax the paperwork just in case we didn't receive it the first time.  Fax number was verified

## 2021-08-22 NOTE — Telephone Encounter (Signed)
Noted paperwork will be faxed once received.

## 2021-08-23 ENCOUNTER — Other Ambulatory Visit: Payer: Self-pay

## 2021-08-23 DIAGNOSIS — N186 End stage renal disease: Secondary | ICD-10-CM

## 2021-08-23 NOTE — Telephone Encounter (Signed)
Paperwork has been signed by pcp and faxed.

## 2021-08-24 ENCOUNTER — Other Ambulatory Visit: Payer: Self-pay

## 2021-08-24 ENCOUNTER — Ambulatory Visit: Payer: Self-pay | Admitting: *Deleted

## 2021-08-24 NOTE — Telephone Encounter (Signed)
Pt's son called to ask to have more Proventil nebulizer called in to [facility].  Unable to reach and no voicemail set up. I can refill nebulizer med per protocol but wanted to see if pt was needing to be seen.

## 2021-08-24 NOTE — Telephone Encounter (Signed)
Pharm called because pt should have a refill remaining of Albuterol Nebulizer. Pharm states he has refill left, they filled it and he did not pick it up so they put it back. Pharm states they will fill it now for him. I am unable to reach him or leave a voicemail.

## 2021-08-24 NOTE — Telephone Encounter (Signed)
Have called three times. Pt unable to be reached, no voicemail. Nebulizer med can be filled per protocol. Unable to reach pt just to check on his status. Will fill rx.

## 2021-08-25 ENCOUNTER — Other Ambulatory Visit: Payer: Self-pay

## 2021-08-25 ENCOUNTER — Ambulatory Visit: Payer: Self-pay | Admitting: *Deleted

## 2021-08-25 ENCOUNTER — Other Ambulatory Visit: Payer: Self-pay | Admitting: Pharmacist

## 2021-08-25 DIAGNOSIS — J984 Other disorders of lung: Secondary | ICD-10-CM

## 2021-08-25 DIAGNOSIS — J449 Chronic obstructive pulmonary disease, unspecified: Secondary | ICD-10-CM

## 2021-08-25 MED ORDER — ALBUTEROL SULFATE (2.5 MG/3ML) 0.083% IN NEBU
2.5000 mg | INHALATION_SOLUTION | Freq: Four times a day (QID) | RESPIRATORY_TRACT | 1 refills | Status: DC | PRN
  Filled 2021-08-25: qty 150, 13d supply, fill #0
  Filled 2021-08-25: qty 150, 20d supply, fill #0

## 2021-08-25 MED ORDER — ALBUTEROL SULFATE HFA 108 (90 BASE) MCG/ACT IN AERS: 2.0000 | INHALATION_SPRAY | Freq: Four times a day (QID) | RESPIRATORY_TRACT | 2 refills | Status: DC | PRN

## 2021-08-25 NOTE — Telephone Encounter (Signed)
Attempted to contact [place] [facility] to verify if patient picked up medication. Did not leave message at this time.

## 2021-08-25 NOTE — Telephone Encounter (Signed)
Routing to CMA 

## 2021-08-25 NOTE — Telephone Encounter (Signed)
  Chief Complaint: requesting refills on inhaler and nebulizer treatment.  Symptoms: out of medication  Frequency: na Pertinent Negatives: Patient denies na Disposition: '[]'$ ED /'[]'$ Urgent Care (no appt availability in office) / '[]'$ Appointment(In office/virtual)/ '[]'$  [place] [facility]/ '[x]'$ Home Care/ '[]'$ Refused Recommended Disposition /'[]'$ [place] [facility]/ '[]'$  Follow-up with PCP Additional Notes:   Will send Rx request for albuterol (proventil) (2.5 mg/ 43m)  0.083% nebulizer solution and aluterol (proair HFA) 108 (90 base) MCG / Act inhlaer Reason for Disposition  [1] Prescription prescribed recently is not at pharmacy AND [2] triager has access to patient's EMR AND [3] prescription is recorded in the EMR  Answer Assessment - Initial Assessment Questions 1. DRUG NAME: "What medicine do you need to have refilled?"     Inhaler and nebulizer treatments 2. REFILLS REMAINING: "How many refills are remaining?" (Note: The label on the medicine or pill bottle will show how many refills are remaining. If there are no refills remaining, then a renewal may be needed.)     1 of 1 3. EXPIRATION DATE: "What is the expiration date?" (Note: The label states when the prescription will expire, and thus can no longer be refilled.)     na 4. PRESCRIBING HCP: "Who prescribed it?" Reason: If prescribed by specialist, call should be referred to that group.     PCP 5. SYMPTOMS: "Do you have any symptoms?"     Na  6. PREGNANCY: "Is there any chance that you are pregnant?" "When was your last menstrual period?"     na  Protocols used: Medication Refill and Renewal Call-A-AH

## 2021-08-25 NOTE — Telephone Encounter (Signed)
Unable to reach pt by phone.

## 2021-08-25 NOTE — Telephone Encounter (Signed)
Requested medication (s) are due for refill today: last refill shows 08/09/21  Requested medication (s) are on the active medication list: yes   Last refill:  na   Future visit scheduled: yes in 2 weeks   Notes to clinic:  unable to speak with pharmacy to see when last refill dispensed. Patient reports he is out of medication . Please advise      Requested Prescriptions  Pending Prescriptions Disp Refills   albuterol (PROVENTIL) (2.5 MG/3ML) 0.083% nebulizer solution 150 mL 1    Sig: Take 3 mLs (2.5 mg total) by nebulization every 6 (six) hours as needed for wheezing or shortness of breath.     Pulmonology:  Beta Agonists 2 Passed - 08/25/2021 11:51 AM      Passed - Last BP in normal range    BP Readings from Last 1 Encounters:  08/18/21 115/76         Passed - Last Heart Rate in normal range    Pulse Readings from Last 1 Encounters:  08/18/21 (!) 54         Passed - Valid encounter within last 12 months    Recent Outpatient Visits           2 weeks ago History of CVA (cerebrovascular accident)   [place], Vernia Buff, NP   2 months ago Primary hypertension   [place], Vernia Buff, NP   7 months ago Left hand pain   [place], [place], [place]   7 months ago Left hand pain   [facility], MD   8 months ago Left hand pain   Primary Care at [facility], Kriste Basque, NP       Future Appointments             In 2 weeks Gildardo Pounds, NP [place]   In 2 months Gildardo Pounds, NP [place]             albuterol (PROAIR HFA) 108 (90 Base) MCG/ACT inhaler 18 g 2    Sig: Inhale 2 puffs into the lungs every 6 (six) hours as needed for wheezing or shortness of breath.     Pulmonology:  Beta Agonists 2 Passed - 08/25/2021 11:51 AM      Passed - Last BP  in normal range    BP Readings from Last 1 Encounters:  08/18/21 115/76         Passed - Last Heart Rate in normal range    Pulse Readings from Last 1 Encounters:  08/18/21 (!) 54         Passed - Valid encounter within last 12 months    Recent Outpatient Visits           2 weeks ago History of CVA (cerebrovascular accident)   [place], Vernia Buff, NP   2 months ago Primary hypertension   [place], Vernia Buff, NP   7 months ago Left hand pain   [place], [place]   7 months ago Left hand pain   Primary Care at [facility], MD   8 months ago Left hand pain   Primary Care at [facility], Kriste Basque, NP  Future Appointments             In 2 weeks Gildardo Pounds, NP [place]   In 2 months Gildardo Pounds, NP [place]

## 2021-08-28 ENCOUNTER — Other Ambulatory Visit: Payer: Self-pay

## 2021-08-29 ENCOUNTER — Other Ambulatory Visit: Payer: Self-pay

## 2021-09-04 ENCOUNTER — Encounter (HOSPITAL_BASED_OUTPATIENT_CLINIC_OR_DEPARTMENT_OTHER): Payer: Self-pay | Admitting: Emergency Medicine

## 2021-09-04 ENCOUNTER — Encounter (HOSPITAL_COMMUNITY): Payer: Self-pay

## 2021-09-04 ENCOUNTER — Emergency Department (HOSPITAL_BASED_OUTPATIENT_CLINIC_OR_DEPARTMENT_OTHER): Payer: Medicare Other

## 2021-09-04 ENCOUNTER — Other Ambulatory Visit: Payer: Self-pay

## 2021-09-04 ENCOUNTER — Inpatient Hospital Stay (HOSPITAL_BASED_OUTPATIENT_CLINIC_OR_DEPARTMENT_OTHER)
Admission: EM | Admit: 2021-09-04 | Discharge: 2021-09-08 | DRG: 175 | Disposition: A | Discharge status: Home Health | Payer: Medicare Other | Attending: Internal Medicine | Admitting: Internal Medicine

## 2021-09-04 DIAGNOSIS — E1122 Type 2 diabetes mellitus with diabetic chronic kidney disease: Secondary | ICD-10-CM | POA: Diagnosis present

## 2021-09-04 DIAGNOSIS — Z992 Dependence on renal dialysis: Secondary | ICD-10-CM

## 2021-09-04 DIAGNOSIS — Z86711 Personal history of pulmonary embolism: Secondary | ICD-10-CM | POA: Diagnosis present

## 2021-09-04 DIAGNOSIS — I69354 Hemiplegia and hemiparesis following cerebral infarction affecting left non-dominant side: Secondary | ICD-10-CM

## 2021-09-04 DIAGNOSIS — R0602 Shortness of breath: Secondary | ICD-10-CM | POA: Diagnosis present

## 2021-09-04 DIAGNOSIS — I428 Other cardiomyopathies: Secondary | ICD-10-CM | POA: Diagnosis present

## 2021-09-04 DIAGNOSIS — I9589 Other hypotension: Secondary | ICD-10-CM | POA: Diagnosis present

## 2021-09-04 DIAGNOSIS — I2699 Other pulmonary embolism without acute cor pulmonale: Secondary | ICD-10-CM | POA: Diagnosis not present

## 2021-09-04 DIAGNOSIS — I472 Ventricular tachycardia, unspecified: Secondary | ICD-10-CM | POA: Diagnosis present

## 2021-09-04 DIAGNOSIS — E1151 Type 2 diabetes mellitus with diabetic peripheral angiopathy without gangrene: Secondary | ICD-10-CM | POA: Diagnosis present

## 2021-09-04 DIAGNOSIS — Z87891 Personal history of nicotine dependence: Secondary | ICD-10-CM

## 2021-09-04 DIAGNOSIS — I5020 Unspecified systolic (congestive) heart failure: Secondary | ICD-10-CM | POA: Diagnosis present

## 2021-09-04 DIAGNOSIS — I4729 Other ventricular tachycardia: Secondary | ICD-10-CM

## 2021-09-04 DIAGNOSIS — Z91148 Patient's other noncompliance with medication regimen for other reason: Secondary | ICD-10-CM

## 2021-09-04 DIAGNOSIS — J449 Chronic obstructive pulmonary disease, unspecified: Secondary | ICD-10-CM | POA: Diagnosis present

## 2021-09-04 DIAGNOSIS — Z20822 Contact with and (suspected) exposure to covid-19: Secondary | ICD-10-CM | POA: Diagnosis present

## 2021-09-04 DIAGNOSIS — D631 Anemia in chronic kidney disease: Secondary | ICD-10-CM | POA: Diagnosis present

## 2021-09-04 DIAGNOSIS — E119 Type 2 diabetes mellitus without complications: Secondary | ICD-10-CM

## 2021-09-04 DIAGNOSIS — I5022 Chronic systolic (congestive) heart failure: Secondary | ICD-10-CM | POA: Diagnosis present

## 2021-09-04 DIAGNOSIS — I132 Hypertensive heart and chronic kidney disease with heart failure and with stage 5 chronic kidney disease, or end stage renal disease: Secondary | ICD-10-CM | POA: Diagnosis present

## 2021-09-04 DIAGNOSIS — I272 Pulmonary hypertension, unspecified: Secondary | ICD-10-CM | POA: Diagnosis present

## 2021-09-04 DIAGNOSIS — I493 Ventricular premature depolarization: Secondary | ICD-10-CM | POA: Diagnosis present

## 2021-09-04 DIAGNOSIS — N186 End stage renal disease: Secondary | ICD-10-CM | POA: Diagnosis present

## 2021-09-04 DIAGNOSIS — D638 Anemia in other chronic diseases classified elsewhere: Secondary | ICD-10-CM | POA: Diagnosis present

## 2021-09-04 DIAGNOSIS — Z7951 Long term (current) use of inhaled steroids: Secondary | ICD-10-CM

## 2021-09-04 DIAGNOSIS — Z888 Allergy status to other drugs, medicaments and biological substances status: Secondary | ICD-10-CM

## 2021-09-04 DIAGNOSIS — F419 Anxiety disorder, unspecified: Secondary | ICD-10-CM | POA: Diagnosis present

## 2021-09-04 DIAGNOSIS — D696 Thrombocytopenia, unspecified: Secondary | ICD-10-CM | POA: Diagnosis present

## 2021-09-04 DIAGNOSIS — Z885 Allergy status to narcotic agent status: Secondary | ICD-10-CM

## 2021-09-04 DIAGNOSIS — R778 Other specified abnormalities of plasma proteins: Secondary | ICD-10-CM

## 2021-09-04 DIAGNOSIS — Z833 Family history of diabetes mellitus: Secondary | ICD-10-CM

## 2021-09-04 DIAGNOSIS — Z8249 Family history of ischemic heart disease and other diseases of the circulatory system: Secondary | ICD-10-CM

## 2021-09-04 DIAGNOSIS — I480 Paroxysmal atrial fibrillation: Secondary | ICD-10-CM | POA: Diagnosis present

## 2021-09-04 DIAGNOSIS — M898X9 Other specified disorders of bone, unspecified site: Secondary | ICD-10-CM | POA: Diagnosis present

## 2021-09-04 DIAGNOSIS — Z809 Family history of malignant neoplasm, unspecified: Secondary | ICD-10-CM

## 2021-09-04 DIAGNOSIS — Z79899 Other long term (current) drug therapy: Secondary | ICD-10-CM

## 2021-09-04 LAB — CBC WITH DIFFERENTIAL/PLATELET
Abs Immature Granulocytes: 0.01 10*3/uL (ref 0.00–0.07)
Basophils Absolute: 0.1 10*3/uL (ref 0.0–0.1)
Basophils Relative: 2 %
Eosinophils Absolute: 0.4 10*3/uL (ref 0.0–0.5)
Eosinophils Relative: 10 %
HCT: 38 % — ABNORMAL LOW (ref 39.0–52.0)
Hemoglobin: 11.6 g/dL — ABNORMAL LOW (ref 13.0–17.0)
Immature Granulocytes: 0 %
Lymphocytes Relative: 28 %
Lymphs Abs: 1.1 10*3/uL (ref 0.7–4.0)
MCH: 28 pg (ref 26.0–34.0)
MCHC: 30.5 g/dL (ref 30.0–36.0)
MCV: 91.8 fL (ref 80.0–100.0)
Monocytes Absolute: 0.5 10*3/uL (ref 0.1–1.0)
Monocytes Relative: 12 %
Neutro Abs: 1.9 10*3/uL (ref 1.7–7.7)
Neutrophils Relative %: 48 %
Platelets: 66 10*3/uL — ABNORMAL LOW (ref 150–400)
RBC: 4.14 MIL/uL — ABNORMAL LOW (ref 4.22–5.81)
RDW: 18.3 % — ABNORMAL HIGH (ref 11.5–15.5)
Smear Review: NORMAL
WBC: 4.1 10*3/uL (ref 4.0–10.5)
nRBC: 0 % (ref 0.0–0.2)

## 2021-09-04 LAB — COMPREHENSIVE METABOLIC PANEL
ALT: 9 U/L (ref 0–44)
AST: 19 U/L (ref 15–41)
Albumin: 3.2 g/dL — ABNORMAL LOW (ref 3.5–5.0)
Alkaline Phosphatase: 74 U/L (ref 38–126)
Anion gap: 15 (ref 5–15)
BUN: 45 mg/dL — ABNORMAL HIGH (ref 6–20)
CO2: 26 mmol/L (ref 22–32)
Calcium: 9.8 mg/dL (ref 8.9–10.3)
Chloride: 96 mmol/L — ABNORMAL LOW (ref 98–111)
Creatinine, Ser: 9.38 mg/dL — ABNORMAL HIGH (ref 0.61–1.24)
GFR, Estimated: 6 mL/min — ABNORMAL LOW (ref >60)
Glucose, Bld: 96 mg/dL (ref 70–99)
Potassium: 4.4 mmol/L (ref 3.5–5.1)
Sodium: 137 mmol/L (ref 135–145)
Total Bilirubin: 1 mg/dL (ref 0.3–1.2)
Total Protein: 8 g/dL (ref 6.5–8.1)

## 2021-09-04 LAB — TROPONIN I (HIGH SENSITIVITY)
Troponin I (High Sensitivity): 72 ng/L — ABNORMAL HIGH (ref <18)
Troponin I (High Sensitivity): 69 ng/L — ABNORMAL HIGH (ref <18)
Troponin I (High Sensitivity): 80 ng/L — ABNORMAL HIGH (ref <18)

## 2021-09-04 LAB — SARS CORONAVIRUS 2 BY RT PCR: SARS Coronavirus 2 by RT PCR: NEGATIVE

## 2021-09-04 LAB — BRAIN NATRIURETIC PEPTIDE: B Natriuretic Peptide: 2911.4 pg/mL — ABNORMAL HIGH (ref 0.0–100.0)

## 2021-09-04 LAB — APTT
aPTT: 100 seconds — ABNORMAL HIGH (ref 24–36)
aPTT: 55 seconds — ABNORMAL HIGH (ref 24–36)

## 2021-09-04 LAB — HEPARIN LEVEL (UNFRACTIONATED): Heparin Unfractionated: 0.93 IU/mL — ABNORMAL HIGH (ref 0.30–0.70)

## 2021-09-04 MED ORDER — IOHEXOL 350 MG/ML SOLN
75.0000 mL | Freq: Once | INTRAVENOUS | Status: AC | PRN
  Administered 2021-09-04: 75 mL via INTRAVENOUS

## 2021-09-04 MED ORDER — HEPARIN BOLUS VIA INFUSION
2000.0000 [IU] | Freq: Once | INTRAVENOUS | Status: AC
  Administered 2021-09-04: 2000 [IU] via INTRAVENOUS

## 2021-09-04 MED ORDER — ACETAMINOPHEN 650 MG RE SUPP: 650.0000 mg | Freq: Four times a day (QID) | RECTAL | Status: DC | PRN

## 2021-09-04 MED ORDER — METOPROLOL TARTRATE 5 MG/5ML IV SOLN
2.5000 mg | Freq: Once | INTRAVENOUS | Status: AC
  Administered 2021-09-04: 2.5 mg via INTRAVENOUS
  Filled 2021-09-04: qty 5

## 2021-09-04 MED ORDER — HEPARIN (PORCINE) 25000 UT/250ML-% IV SOLN: 12.0000 [IU]/kg/h | INTRAVENOUS | Status: DC

## 2021-09-04 MED ORDER — ACETAMINOPHEN 325 MG PO TABS
650.0000 mg | ORAL_TABLET | Freq: Four times a day (QID) | ORAL | Status: DC | PRN
  Administered 2021-09-05: 650 mg via ORAL
  Filled 2021-09-04: qty 2

## 2021-09-04 MED ORDER — HEPARIN (PORCINE) 25000 UT/250ML-% IV SOLN
1200.0000 [IU]/h | INTRAVENOUS | Status: DC
  Administered 2021-09-04: 1300 [IU]/h via INTRAVENOUS
  Administered 2021-09-05: 1400 [IU]/h via INTRAVENOUS
  Administered 2021-09-05: 1200 [IU]/h via INTRAVENOUS
  Filled 2021-09-04 (×3): qty 250

## 2021-09-04 MED ORDER — INSULIN ASPART 100 UNIT/ML IJ SOLN: 0.0000 [IU] | Freq: Three times a day (TID) | INTRAMUSCULAR | Status: DC

## 2021-09-04 MED ORDER — CHLORHEXIDINE GLUCONATE CLOTH 2 % EX PADS
6.0000 | MEDICATED_PAD | Freq: Every day | CUTANEOUS | Status: DC
  Administered 2021-09-05 – 2021-09-08 (×3): 6 via TOPICAL

## 2021-09-04 NOTE — H&P (Signed)
History and Physical    PRATHER FAILLA HYQ:[id number] DOB: Mar 25, 1960 DOA: 09/04/2021  PCP: Gildardo Pounds, NP  Patient coming from: Home.  Chief Complaint: Shortness of breath.  HPI: Nathaniel White is a 61 y.o. male with history of ESRD on hemodialysis, nonischemic cardiomyopathy last EF measured was 35% in March 2023 with severe pulmonary hypertension and RV failure, paroxysmal atrial fibrillation, prior history of stroke with left-sided weakness admitted in June 2023 for necrotizing pneumonia presents to the ER at med center with complaints of shortness of breath ongoing for the last 1 month.  Denies any chest pain fever chills or productive cough.  Patient has been prescribed Eliquis but has not taken it for last 1 month.  ED Course: In the ER patient had a brief run of nonsustained V. tach.  CT angiogram does show pulm embolism.  Patient was started on heparin and admitted for further work-up.  Review of Systems: As per HPI, rest all negative.   Past Medical History:  Diagnosis Date   Anemia    Anxiety    Asthma    CHF (congestive heart failure) ([place])    Complication from renal dialysis device 95/28/4132   Complication of anesthesia 2017   according to pt and spouse pt was moving around and cough while under and pt had difficulty waking up so the anesthesia had to be reversed. and pt admitted to ICU.    COPD (chronic obstructive pulmonary disease) (HCC)    Diabetes mellitus without complication ([place])    Type II - Patient states he does not have diabetes, "they said sometimes when you start dialysis you don't have diabetes any longer"    ESOPHAGEAL VARICES 10/04/2008   Qualifier: Diagnosis of  By: Fuller Plan MD Lamont Snowball T    ESRD    on HD, T-TH-Sat - [place]   Hemiparesis due to old stroke [facility])    left   Hypertension    Hx, not current problems, no meds   LV dysfunction    EF 25-30% by echo 07/2011   Memory loss due to medical condition    due to stroke   MR  (mitral regurgitation)    moderate to severe, echo 07/2011   Peripheral vascular disease ([place])    Shortness of breath    with exertion   Stroke ([place])    TIA's-left sided weakness   Tobacco abuse     Past Surgical History:  Procedure Laterality Date   ABDOMINAL AORTOGRAM W/LOWER EXTREMITY Bilateral 07/21/2018   Procedure: ABDOMINAL AORTOGRAM W/LOWER EXTREMITY;  Surgeon: Waynetta Sandy, MD;  Location: [place] [facility];  Service: Cardiovascular;  Laterality: Bilateral;   AMPUTATION Left 11/05/2018   Procedure: AMPUTATION SECOND TOE LEFT FOOT;  Surgeon: Waynetta Sandy, MD;  Location: [place];  Service: Vascular;  Laterality: Left;   AMPUTATION Left 02/13/2021   Procedure: Left small finger AMPUTATION DIGIT;  Surgeon: Sherilyn Cooter, MD;  Location: [place];  Service: Orthopedics;  Laterality: Left;   AV FISTULA PLACEMENT     CARDIAC CATHETERIZATION     [facility]   COLONOSCOPY W/ BIOPSIES AND POLYPECTOMY     FISTULA SUPERFICIALIZATION Left 11/10/2013   Procedure: FISTULA PLICATION;  Surgeon: Serafina Mitchell, MD;  Location: [place];  Service: Vascular;  Laterality: Left;   INSERTION OF DIALYSIS CATHETER N/A 06/20/2021   Procedure: INSERTION OF DIALYSIS CATHETER;  Surgeon: Broadus John, MD;  Location: [place];  Service: Vascular;  Laterality: N/A;   KIDNEY  TRANSPLANT     2011 rejected kidney 2012 back on dialysis   LEFT AND RIGHT HEART CATHETERIZATION WITH CORONARY ANGIOGRAM N/A 10/09/2013   Procedure: LEFT AND RIGHT HEART CATHETERIZATION WITH CORONARY ANGIOGRAM;  Surgeon: Burnell Blanks, MD;  Location[facility] CATH LAB;  Service: Cardiovascular;  Laterality: N/A;   LIGATION OF ARTERIOVENOUS  FISTULA Left 06/20/2021   Procedure: LIGATION OF ARTERIOVENOUS  FISTULA;  Surgeon: Broadus John, MD;  Location: [place];  Service: Vascular;  Laterality: Left;   PERIPHERAL VASCULAR INTERVENTION Left 07/21/2018   Procedure: PERIPHERAL VASCULAR INTERVENTION;  Surgeon: Waynetta Sandy, MD;  Location: [place] [facility];  Service: Cardiovascular;  Laterality: Left;  Popliteal   REVISON OF ARTERIOVENOUS FISTULA Left 08/26/2013   Procedure: EXCISION OF ERODED SKIN AND EXPLORATION OF MAIN LEFT UPPER ARM AV FISTULA;  Surgeon: Rosetta Posner, MD;  Location: [place];  Service: Vascular;  Laterality: Left;   REVISON OF ARTERIOVENOUS FISTULA Left 02/05/2014   Procedure: REPAIR OF ARTERIOVENOUS FISTULA ANEURYSM;  Surgeon: Serafina Mitchell, MD;  Location: [facility];  Service: Vascular;  Laterality: Left;   REVISON OF ARTERIOVENOUS FISTULA Left 03/10/2021   Procedure: LEFT ARM REVISION OF ARTERIOVENOUS FISTULA WITH PLICATION;  Surgeon: Waynetta Sandy, MD;  Location: [place];  Service: Vascular;  Laterality: Left;   SHUNTOGRAM N/A 11/05/2012   Procedure: Fistulogram;  Surgeon: Serafina Mitchell, MD;  Location: [facility] CATH LAB;  Service: Cardiovascular;  Laterality: N/A;   TEE WITHOUT CARDIOVERSION  08/17/2011   Procedure: TRANSESOPHAGEAL ECHOCARDIOGRAM (TEE);  Surgeon: Larey Dresser, MD;  Location: [facility] ENDOSCOPY;  Service: Cardiovascular;  Laterality: N/A;   ULTRASOUND GUIDANCE FOR VASCULAR ACCESS  06/20/2021   Procedure: ULTRASOUND GUIDANCE FOR VASCULAR ACCESS;  Surgeon: Broadus John, MD;  Location: [place];  Service: Vascular;;   VIDEO ASSISTED THORACOSCOPY (VATS)/DECORTICATION  08/10/11     reports that he quit smoking about 9 years ago. His smoking use included cigarettes. He has a 7.50 pack-year smoking history. He has never used smokeless tobacco. He reports that he does not drink alcohol and does not use drugs.  Allergies  Allergen Reactions   Codeine Shortness Of Breath   Dextromethorphan-Guaifenesin Shortness Of Breath   Iron Dextran Shortness Of Breath   Morphine And Related Shortness Of Breath    Family History  Problem Relation Age of Onset   Hypertension Mother    Varicose Veins Mother    Diabetes Paternal Grandmother    Cancer Paternal Grandfather    CAD  Paternal Uncle     Prior to Admission medications   Medication Sig Start Date End Date Taking? Authorizing Provider  acetaminophen (TYLENOL) 500 MG tablet Take 1 tablet (500 mg total) by mouth every 6 (six) hours as needed. Patient not taking: Reported on 08/09/2021 04/11/18   Zigmund Gottron, NP  albuterol [facility] HFA) 108 (90 Base) MCG/ACT inhaler Inhale 2 puffs into the lungs every 6 (six) hours as needed for wheezing or shortness of breath. 08/25/21   Charlott Rakes, MD  albuterol (PROVENTIL) (2.5 MG/3ML) 0.083% nebulizer solution Take 3 mLs (2.5 mg total) by nebulization every 6 (six) hours as needed for wheezing or shortness of breath. 08/25/21   Charlott Rakes, MD  apixaban (ELIQUIS) 2.5 MG TABS tablet Take 1 tablet (2.5 mg total) by mouth 2 (two) times daily. Hold your eliquis if your platelets are over 50.  You should follow up with cardiology and hematology about your blood thinners. 06/25/21   Hulen Luster  Clint Lipps., MD  B Complex-C-Folic Acid (DIALYVITE 716) 0.8 MG TABS Take 1 tablet by mouth daily. Patient not taking: Reported on 08/09/2021 04/14/21   [provider]  docusate sodium (COLACE) 100 MG capsule Take 1 capsule (100 mg total) by mouth 2 (two) times daily. Patient not taking: Reported on 08/09/2021 03/15/21   Raiford Noble Latif, DO  fluticasone-salmeterol (ADVAIR DISKUS) 250-50 MCG/ACT AEPB Inhale 1 puff into the lungs in the morning and at bedtime. NEEDS PASS 08/09/21   Gildardo Pounds, NP  glucose blood test strip  12/26/09   [provider]  hydrOXYzine (ATARAX) 10 MG tablet Take 1 tablet (10 mg total) by mouth 3 (three) times daily as needed. For anxiety Patient taking differently: Take 10 mg by mouth 3 (three) times daily as needed for anxiety. 06/07/21   Gildardo Pounds, NP  midodrine (PROAMATINE) 10 MG tablet Place 1 tablet (10 mg total) into feeding tube 3 (three) times daily with meals. Patient taking differently: Take 10 mg by mouth 3 (three) times daily with  meals. 03/15/21   Sheikh, Omair Latif, DO  montelukast (SINGULAIR) 10 MG tablet Take 1 tablet (10 mg total) by mouth at bedtime. 06/07/21   Gildardo Pounds, NP  pantoprazole (PROTONIX) 40 MG tablet Take 1 tablet (40 mg total) by mouth daily. Patient not taking: Reported on 08/09/2021 06/23/21 06/23/22  Elodia Florence., MD  VELPHORO 500 MG chewable tablet Chew 1,000 mg by mouth 2 (two) times daily after a meal. 02/06/21   [provider]    Physical Exam: Constitutional: Moderately built and nourished. Vitals:   09/04/21 1551 09/04/21 1830 09/04/21 1930 09/04/21 1934  BP: (!) 125/98 (!) 123/92 119/81   Pulse: 71 (!) 48    Resp: 19 (!) 31 (!) 24   Temp:    97.6 F (36.4 C)  TempSrc:    Oral  SpO2: 100% 94%    Weight:      Height:       Eyes: Anicteric no pallor. ENMT: No discharge from the ears eyes nose and mouth. Neck: No mass felt.  No neck rigidity. Respiratory: No rhonchi or crepitations. Cardiovascular: S1-S2 heard. Abdomen: Soft nontender bowel sound present. Musculoskeletal: Mild edema of the lower extremities. Skin: No rash. Neurologic: Alert awake oriented to time place and person.  Moves all extremities. Psychiatric: Normal.  Normal affect.   Labs on Admission: I have personally reviewed following labs and imaging studies  CBC: Recent Labs  Lab 09/04/21 0100  WBC 4.1  NEUTROABS 1.9  HGB 11.6*  HCT 38.0*  MCV 91.8  PLT 66*   Basic Metabolic Panel: Recent Labs  Lab 09/04/21 0100  NA 137  K 4.4  CL 96*  CO2 26  GLUCOSE 96  BUN 45*  CREATININE 9.38*  CALCIUM 9.8   GFR: Estimated Creatinine Clearance: 8.5 mL/min (A) (by C-G formula based on SCr of 9.38 mg/dL (H)). Liver Function Tests: Recent Labs  Lab 09/04/21 0100  AST 19  ALT 9  ALKPHOS 74  BILITOT 1.0  PROT 8.0  ALBUMIN 3.2*   No results for input(s): "LIPASE", "AMYLASE" in the last 168 hours. No results for input(s): "AMMONIA" in the last 168 hours. Coagulation Profile: No  results for input(s): "INR", "PROTIME" in the last 168 hours. Cardiac Enzymes: No results for input(s): "CKTOTAL", "CKMB", "CKMBINDEX", "TROPONINI" in the last 168 hours. BNP (last 3 results) No results for input(s): "PROBNP" in the last 8760 hours. HbA1C: No results for  input(s): "HGBA1C" in the last 72 hours. CBG: No results for input(s): "GLUCAP" in the last 168 hours. Lipid Profile: No results for input(s): "CHOL", "HDL", "LDLCALC", "TRIG", "CHOLHDL", "LDLDIRECT" in the last 72 hours. Thyroid Function Tests: No results for input(s): "TSH", "T4TOTAL", "FREET4", "T3FREE", "THYROIDAB" in the last 72 hours. Anemia Panel: No results for input(s): "VITAMINB12", "FOLATE", "FERRITIN", "TIBC", "IRON", "RETICCTPCT" in the last 72 hours. Urine analysis:    Component Value Date/Time   COLORURINE YELLOW 02/13/2010 0703   APPEARANCEUR CLOUDY (A) 02/13/2010 0703   LABSPEC 1.013 02/13/2010 0703   PHURINE 8.5 (H) 02/13/2010 0703   GLUCOSEU NEGATIVE 12/18/2009 0546   HGBUR LARGE (A) 02/13/2010 0703   BILIRUBINUR NEGATIVE 02/13/2010 0703   KETONESUR NEGATIVE 02/13/2010 0703   PROTEINUR >300 (A) 02/13/2010 0703   UROBILINOGEN 0.2 02/13/2010 0703   NITRITE NEGATIVE 02/13/2010 0703   LEUKOCYTESUR NEGATIVE 02/13/2010 0703   Sepsis Labs: '@LABRCNTIP'$ (procalcitonin:4,lacticidven:4) ) Recent Results (from the past 240 hour(s))  SARS Coronavirus 2 by RT PCR (hospital order, performed in [place] hospital lab) *cepheid single result test* Anterior Nasal Swab     Status: None   Collection Time: 09/04/21  2:22 AM   Specimen: Anterior Nasal Swab  Result Value Ref Range Status   SARS Coronavirus 2 by RT PCR NEGATIVE NEGATIVE Final    Comment: (NOTE) SARS-CoV-2 target nucleic acids are NOT DETECTED.  The SARS-CoV-2 RNA is generally detectable in upper and lower respiratory specimens during the acute phase of infection. The lowest concentration of SARS-CoV-2 viral copies this assay can detect is  250 copies / mL. A negative result does not preclude SARS-CoV-2 infection and should not be used as the sole basis for treatment or other patient management decisions.  A negative result may occur with improper specimen collection / handling, submission of specimen other than nasopharyngeal swab, presence of viral mutation(s) within the areas targeted by this assay, and inadequate number of viral copies (<250 copies / mL). A negative result must be combined with clinical observations, patient history, and epidemiological information.  Fact Sheet for Patients:   https://www.patel.info/  Fact Sheet for Healthcare Providers: https://hall.com/  This test is not yet approved or  cleared by the [place] FDA and has been authorized for detection and/or diagnosis of SARS-CoV-2 by FDA under an Emergency Use Authorization (EUA).  This EUA will remain in effect (meaning this test can be used) for the duration of the COVID-19 declaration under Section 564(b)(1) of the Act, 21 U.S.C. section 360bbb-3(b)(1), unless the authorization is terminated or revoked sooner.  Performed at [facility], [address] [place], [place] [address]      Radiological Exams on Admission: CT Angio Chest PE W and/or Wo Contrast  Result Date: 09/04/2021 CLINICAL DATA:  Shortness of breath EXAM: CT ANGIOGRAPHY CHEST WITH CONTRAST TECHNIQUE: Multidetector CT imaging of the chest was performed using the standard protocol during bolus administration of intravenous contrast. Multiplanar CT image reconstructions and MIPs were obtained to evaluate the vascular anatomy. RADIATION DOSE REDUCTION: This exam was performed according to the departmental dose-optimization program which includes automated exposure control, adjustment of the mA and/or kV according to patient size and/or use of iterative reconstruction technique. CONTRAST:  56m OMNIPAQUE IOHEXOL 350 MG/ML  SOLN COMPARISON:  Noncontrast chest CT 06/21/2021 FINDINGS: Cardiovascular: Cardiomegaly. Preferential dilatation of the right heart with 1.74 RV to LV ratio and hepatic venous reflux, likely chronic when compared to prior and when considering the pulmonary arteries. There is non  enhancement asymmetrically involving right lower lobe segmental arteries. Some mixing artifact is noted within the pulmonary arteries, worse in the right lung. Pulmonary arteries show chronic diffuse wall calcification which is linear. Atheromatous calcification. Perma catheter on the left in good position. Mediastinum/Nodes: Negative for adenopathy or mass. Lungs/Pleura: Pleural thickening with calcification asymmetrically involving the right hemithorax. There are bands of opacity in the bilateral lungs from atelectasis/scarring, similar to prior. Improvement in left lower lobe aeration since prior. Upper Abdomen: No acute finding. Partially covered polycystic kidneys. Musculoskeletal: Generalized osteopenia. Review of the MIP images confirms the above findings. Critical Value/emergent results were called by telephone at the time of interpretation on 09/04/2021 at 4:45 am to provider [facility] , who verbally acknowledged these results. IMPRESSION: 1. Positive for right lower lobe segmental branch clot. There is a background of pulmonary hypertension with dilated right heart and pulmonary artery calcification. 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 Chronic lung disease; improved left lower lobe aeration since June 2023. 3.  Aortic Atherosclerosis (ICD10-I70.0). Electronically Signed   By: Jorje Guild M.D.   On: 09/04/2021 04:47   DG Chest Portable 1 View  Result Date: 09/04/2021 CLINICAL DATA:  Shortness of breath EXAM: PORTABLE CHEST 1 VIEW COMPARISON:  07/10/2021 FINDINGS: Scarring in the right mid/lower lung and lingula. Right apical pleural-parenchymal scarring. No focal consolidation. Chronic blunting of the right costophrenic angle.  No pleural effusion or pneumothorax. Stable cardiomegaly. Left chest dual lumen dialysis catheter terminating at the cavoatrial junction. IMPRESSION: Stable cardiomegaly. Scattered scarring. No evidence of acute cardiopulmonary disease. Electronically Signed   By: Julian Hy M.D.   On: 09/04/2021 01:51    EKG: Independently reviewed.  A-fib with PVCs.  Assessment/Plan Principal Problem:   Acute pulmonary embolism (HCC) Active Problems:   ESRD (end stage renal disease) (HCC)   HFrEF (heart failure with reduced ejection fraction) (HCC)   NSVT (nonsustained ventricular tachycardia) (HCC)   Type 2 diabetes mellitus (HCC)   Thrombocytopenia (HCC)   Shortness of breath   Pulmonary embolism ([place])    Pulmonary embolism likely unprovoked.  Blood pressures in the low normal but patient has chronic hypotension and takes midodrine.  Patient has been started on a heparin.  Trend cardiac markers check 2D echo.  Check Dopplers of the lower extremity. Shortness of breath likely from pulmonary embolism and also fibrosis seen in the CT angiogram and also has known history of nonischemic cardiomyopathy and RV failure and pulmonary hypertension. Brief run of NSVT.  Cardiology was notified and requested observation.  Check 2D echo.  Monitor in telemetry.  Closely monitor electrolytes.  Has had brief runs of NSVT when patient was admitted for necrotizing pneumonia in June. ESRD on hemodialysis on Tuesday Thursday and Saturday.  Consult nephrology for dialysis. Paroxysmal atrial fibrillation presently rate controlled.  Does not take any rate controlling medications.  Presently on heparin. Diabetes mellitus type 2 mention in the chart.  Patient states he does not take any medication.  Check hemoglobin A1c. Chronic anemia and thrombocytopenia.  Follow CBC. Prior history of stroke with left-sided weakness.   DVT prophylaxis: Heparin. Code Status: Full code. Family Communication: Discussed with  patient. Disposition Plan: Home. Consults called: ER physician discussed with cardiologist. Admission status: Observation.   Rise Patience MD [facility] Pager [phone].  If 7PM-7AM, please contact night-coverage [email or web address] Password [facility]  09/04/2021, 9:05 PM

## 2021-09-04 NOTE — ED Notes (Signed)
Placed patient on 2L [place] per MD verbal order. Sats up to 99% with oxygen. RN at bedside.

## 2021-09-04 NOTE — Progress Notes (Signed)
Patient arrived via EMS with occluded IV in R hand; IV team notified, successfully diagnosed and fixed. Will restart heparin gtt.  Patient has bilateral dialysis fistulas. Notified physician for updates to extremity precautions.

## 2021-09-04 NOTE — ED Notes (Signed)
Heparin level collected as ordered and sent to lab for pickup

## 2021-09-04 NOTE — Progress Notes (Signed)
ANTICOAGULATION CONSULT NOTE  Pharmacy Consult for Heparin Indication: pulmonary embolus Brief A/P: aPTT subtherapeutic Increase Heparin rate    Allergies  Allergen Reactions   Codeine Shortness Of Breath   Dextromethorphan-Guaifenesin Shortness Of Breath   Iron Dextran Shortness Of Breath   Morphine And Related Shortness Of Breath    Patient Measurements: Height: '5\' 9"'$  (175.3 cm) Weight: 75.2 kg (165 lb 12.6 oz) IBW/kg (Calculated) : 70.7  Vital Signs: Temp: 97.8 F (36.6 C) (08/28 2050) Temp Source: Axillary (08/28 2050) BP: 119/81 (08/28 1930) Pulse Rate: 48 (08/28 1830)  Labs: Recent Labs    09/04/21 0100 09/04/21 0454 09/04/21 1325 09/04/21 2216  HGB 11.6*  --   --   --   HCT 38.0*  --   --   --   PLT 66*  --   --   --   APTT  --   --  100* 55*  HEPARINUNFRC  --   --  0.93*  --   CREATININE 9.38*  --   --   --   TROPONINIHS 72* 69*  --  80*     Estimated Creatinine Clearance: 8.5 mL/min (A) (by C-G formula based on SCr of 9.38 mg/dL (H)).   Assessment: 61 y.o. male admitted with shortness of breath and PE for  heparin.  Pt has h/o Afib on Eliquis, last dose taken 8/26.    Goal of Therapy:  aPTT 66-102 sec Heparin level 0.3-0.7 units/ml Monitor platelets by anticoagulation protocol: Yes   Plan:  Increase Heparin 1400 units/hr Follow-up am labs.   Caryl Pina 09/04/2021,11:42 PM

## 2021-09-04 NOTE — ED Notes (Signed)
Pt agreed to transfer, pen pad cut off when attempting to sign

## 2021-09-04 NOTE — Progress Notes (Signed)
ANTICOAGULATION CONSULT NOTE  Pharmacy Consult for Heparin Indication: pulmonary embolus  Allergies  Allergen Reactions   Codeine Shortness Of Breath   Dextromethorphan-Guaifenesin Shortness Of Breath   Iron Dextran Shortness Of Breath   Morphine And Related Shortness Of Breath    Patient Measurements: Height: '5\' 9"'$  (175.3 cm) Weight: 79.4 kg (175 lb) IBW/kg (Calculated) : 70.7  Vital Signs: Temp: 97.6 F (36.4 C) (08/28 1129) Temp Source: Oral (08/28 0502) BP: 142/92 (08/28 1230) Pulse Rate: 78 (08/28 1230)  Labs: Recent Labs    09/04/21 0100 09/04/21 0454 09/04/21 1325  HGB 11.6*  --   --   HCT 38.0*  --   --   PLT 66*  --   --   APTT  --   --  100*  HEPARINUNFRC  --   --  0.93*  CREATININE 9.38*  --   --   TROPONINIHS 72* 69*  --      Estimated Creatinine Clearance: 8.5 mL/min (A) (by C-G formula based on SCr of 9.38 mg/dL (H)).   Assessment: 61 y.o. male admitted with shortness of breath and PE initiated on IV heparin. Initial heparin level is above goal (likely related to recent apixaban use) but aPTT is therapeutic however it is at the upper end of the goal range. No bleeding noted. Noted chronic thrombocytopenia.     Goal of Therapy:  aPTT 66-102 sec Heparin level 0.3-0.7 units/ml Monitor platelets by anticoagulation protocol: Yes   Plan:  Reduce heparin gtt slightly to 1250 units/hr Check an 8 hr aPTT Daily aPTT, heparin level and CBC  Salome Arnt, PharmD, BCPS, BCEMP Clinical Pharmacist Please see AMION for all pharmacy numbers 09/04/2021 2:52 PM

## 2021-09-04 NOTE — ED Triage Notes (Signed)
SHOB x 1 month. Seen by PCP, given nebulizer. He and family state it isn't helping. Also reports a productive cough. Dialysis patient. Denies fever. Visitor with him mentions "infection in foot".

## 2021-09-04 NOTE — Progress Notes (Signed)
ANTICOAGULATION CONSULT NOTE - Initial Consult  Pharmacy Consult for Heparin Indication: pulmonary embolus  Allergies  Allergen Reactions   Codeine Shortness Of Breath   Dextromethorphan-Guaifenesin Shortness Of Breath   Iron Dextran Shortness Of Breath   Morphine And Related Shortness Of Breath    Patient Measurements: Height: '5\' 9"'$  (175.3 cm) Weight: 79.4 kg (175 lb) IBW/kg (Calculated) : 70.7  Vital Signs: Temp: 97.6 F (36.4 C) (08/28 0502) Temp Source: Oral (08/28 0502) BP: 138/85 (08/28 0457) Pulse Rate: 75 (08/28 0502)  Labs: Recent Labs    09/04/21 0100  HGB 11.6*  HCT 38.0*  PLT 66*  CREATININE 9.38*  TROPONINIHS 72*    Estimated Creatinine Clearance: 8.5 mL/min (A) (by C-G formula based on SCr of 9.38 mg/dL (H)).   Medical History: Past Medical History:  Diagnosis Date   Anemia    Anxiety    Asthma    CHF (congestive heart failure) ([place])    Complication from renal dialysis device 32/99/2426   Complication of anesthesia 2017   according to pt and spouse pt was moving around and cough while under and pt had difficulty waking up so the anesthesia had to be reversed. and pt admitted to ICU.    COPD (chronic obstructive pulmonary disease) ([facility])    Diabetes mellitus without complication ([place])    Type II - Patient states he does not have diabetes, "they said sometimes when you start dialysis you don't have diabetes any longer"    ESOPHAGEAL VARICES 10/04/2008   Qualifier: Diagnosis of  By: Fuller Plan MD Lamont Snowball T    ESRD    on HD, [facility] - [place]   Hemiparesis due to old stroke [facility])    left   Hypertension    Hx, not current problems, no meds   LV dysfunction    EF 25-30% by echo 07/2011   Memory loss due to medical condition    due to stroke   MR (mitral regurgitation)    moderate to severe, echo 07/2011   Peripheral vascular disease ([place])    Shortness of breath    with exertion   Stroke ([place])    TIA's-left sided weakness   Tobacco abuse      Medications:  No current facility-administered medications on file prior to encounter.   Current Outpatient Medications on File Prior to Encounter  Medication Sig Dispense Refill   acetaminophen (TYLENOL) 500 MG tablet Take 1 tablet (500 mg total) by mouth every 6 (six) hours as needed. (Patient not taking: Reported on 08/09/2021) 30 tablet 0   albuterol (PROAIR HFA) 108 (90 Base) MCG/ACT inhaler Inhale 2 puffs into the lungs every 6 (six) hours as needed for wheezing or shortness of breath. 18 g 2   albuterol (PROVENTIL) (2.5 MG/3ML) 0.083% nebulizer solution Take 3 mLs (2.5 mg total) by nebulization every 6 (six) hours as needed for wheezing or shortness of breath. 150 mL 1   apixaban (ELIQUIS) 2.5 MG TABS tablet Take 1 tablet (2.5 mg total) by mouth 2 (two) times daily. Hold your eliquis if your platelets are over 50.  You should follow up with cardiology and hematology about your blood thinners. 60 tablet 5   B Complex-C-Folic Acid (DIALYVITE 834) 0.8 MG TABS Take 1 tablet by mouth daily. (Patient not taking: Reported on 08/09/2021)     docusate sodium (COLACE) 100 MG capsule Take 1 capsule (100 mg total) by mouth 2 (two) times daily. (Patient not taking: Reported on 08/09/2021) 10 capsule 0  fluticasone-salmeterol (ADVAIR DISKUS) 250-50 MCG/ACT AEPB Inhale 1 puff into the lungs in the morning and at bedtime. NEEDS PASS 180 each 6   glucose blood test strip      hydrOXYzine (ATARAX) 10 MG tablet Take 1 tablet (10 mg total) by mouth 3 (three) times daily as needed. For anxiety (Patient taking differently: Take 10 mg by mouth 3 (three) times daily as needed for anxiety.) 60 tablet 0   midodrine (PROAMATINE) 10 MG tablet Place 1 tablet (10 mg total) into feeding tube 3 (three) times daily with meals. (Patient taking differently: Take 10 mg by mouth 3 (three) times daily with meals.) 90 tablet 0   montelukast (SINGULAIR) 10 MG tablet Take 1 tablet (10 mg total) by mouth at bedtime. 90 tablet 3    pantoprazole (PROTONIX) 40 MG tablet Take 1 tablet (40 mg total) by mouth daily. (Patient not taking: Reported on 08/09/2021) 30 tablet 1   VELPHORO 500 MG chewable tablet Chew 1,000 mg by mouth 2 (two) times daily after a meal.       Assessment: 62 y.o. male admitted with shortness of breath and PE for  heparin.  Pt has h/o Afib on Eliquis, last dose taken 8/26.   Goal of Therapy:  aPTT 66-102 sec Heparin level 0.3-0.7 units/ml Monitor platelets by anticoagulation protocol: Yes   Plan:  Heparin 2000 units IV bolus, then start heparin 1300 units/hr Check aPTT and heparin level in 8 hours   Cortlan Dolin, Bronson Curb 09/04/2021,5:08 AM

## 2021-09-04 NOTE — Progress Notes (Signed)
Patients head lowered, per patient request, as he states it helps with his shortness of breath. Vitals stable.

## 2021-09-04 NOTE — ED Provider Notes (Addendum)
[facility] Provider Note   CSN: [id number] Arrival date & time: 09/04/21  0023     History  Chief Complaint  Patient presents with   Shortness of [place] is a 61 y.o. male.  The history is provided by the patient.  Shortness of Breath Severity:  Moderate Onset quality:  Gradual Duration:  1 month Timing:  Constant Progression:  Worsening Chronicity:  New Context: not URI   Relieved by:  Nothing Worsened by:  Nothing Associated symptoms: no abdominal pain, no fever, no vomiting and no wheezing   Risk factors: no recent alcohol use   Patient with ESRD TTS who last was dialyzed with normal load of 3 lbs off and also AFIB on Eliquis last dose Saturday presents with SOB x 1 month.       Home Medications Prior to Admission medications   Medication Sig Start Date End Date Taking? Authorizing Provider  acetaminophen (TYLENOL) 500 MG tablet Take 1 tablet (500 mg total) by mouth every 6 (six) hours as needed. Patient not taking: Reported on 08/09/2021 04/11/18   Zigmund Gottron, NP  albuterol [facility] HFA) 108 (90 Base) MCG/ACT inhaler Inhale 2 puffs into the lungs every 6 (six) hours as needed for wheezing or shortness of breath. 08/25/21   Charlott Rakes, MD  albuterol (PROVENTIL) (2.5 MG/3ML) 0.083% nebulizer solution Take 3 mLs (2.5 mg total) by nebulization every 6 (six) hours as needed for wheezing or shortness of breath. 08/25/21   Charlott Rakes, MD  apixaban (ELIQUIS) 2.5 MG TABS tablet Take 1 tablet (2.5 mg total) by mouth 2 (two) times daily. Hold your eliquis if your platelets are over 50.  You should follow up with cardiology and hematology about your blood thinners. 06/25/21   Elodia Florence., MD  B Complex-C-Folic Acid (DIALYVITE 409) 0.8 MG TABS Take 1 tablet by mouth daily. Patient not taking: Reported on 08/09/2021 04/14/21   [provider]  docusate sodium (COLACE) 100 MG capsule Take 1 capsule (100 mg  total) by mouth 2 (two) times daily. Patient not taking: Reported on 08/09/2021 03/15/21   Raiford Noble Latif, DO  fluticasone-salmeterol (ADVAIR DISKUS) 250-50 MCG/ACT AEPB Inhale 1 puff into the lungs in the morning and at bedtime. NEEDS PASS 08/09/21   Gildardo Pounds, NP  glucose blood test strip  12/26/09   [provider]  hydrOXYzine (ATARAX) 10 MG tablet Take 1 tablet (10 mg total) by mouth 3 (three) times daily as needed. For anxiety Patient taking differently: Take 10 mg by mouth 3 (three) times daily as needed for anxiety. 06/07/21   Gildardo Pounds, NP  midodrine (PROAMATINE) 10 MG tablet Place 1 tablet (10 mg total) into feeding tube 3 (three) times daily with meals. Patient taking differently: Take 10 mg by mouth 3 (three) times daily with meals. 03/15/21   Sheikh, Omair Latif, DO  montelukast (SINGULAIR) 10 MG tablet Take 1 tablet (10 mg total) by mouth at bedtime. 06/07/21   Gildardo Pounds, NP  pantoprazole (PROTONIX) 40 MG tablet Take 1 tablet (40 mg total) by mouth daily. Patient not taking: Reported on 08/09/2021 06/23/21 06/23/22  Elodia Florence., MD  VELPHORO 500 MG chewable tablet Chew 1,000 mg by mouth 2 (two) times daily after a meal. 02/06/21   [provider]      Allergies    Codeine, Dextromethorphan-guaifenesin, Iron dextran, and Morphine and related    Review of Systems  Review of Systems  Constitutional:  Negative for fever.  HENT:  Negative for facial swelling.   Eyes:  Negative for redness.  Respiratory:  Positive for shortness of breath. Negative for wheezing.   Cardiovascular:  Negative for leg swelling.  Gastrointestinal:  Negative for abdominal pain and vomiting.  All other systems reviewed and are negative.   Physical Exam Updated Vital Signs BP (!) 130/92   Pulse 92   Temp 98 F (36.7 C) (Oral)   Resp (!) 21   Ht '5\' 9"'$  (1.753 m)   Wt 79.4 kg   SpO2 95%   BMI 25.84 kg/m  Physical Exam Vitals and nursing note reviewed.   Constitutional:      General: He is not in acute distress.    Appearance: He is well-developed. He is not diaphoretic.  HENT:     Head: Normocephalic and atraumatic.     Nose: Nose normal.  Eyes:     Conjunctiva/sclera: Conjunctivae normal.     Pupils: Pupils are equal, round, and reactive to light.  Cardiovascular:     Rate and Rhythm: Normal rate. Rhythm irregular.     Pulses: Normal pulses.     Heart sounds: Normal heart sounds.  Pulmonary:     Effort: Pulmonary effort is normal.     Breath sounds: Normal breath sounds. No wheezing or rales.  Abdominal:     General: Bowel sounds are normal.     Palpations: Abdomen is soft.     Tenderness: There is no abdominal tenderness. There is no guarding or rebound.  Musculoskeletal:        General: Normal range of motion.     Cervical back: Normal range of motion and neck supple.  Skin:    General: Skin is warm and dry.     Capillary Refill: Capillary refill takes less than 2 seconds.  Neurological:     General: No focal deficit present.     Mental Status: He is alert and oriented to person, place, and time.     Deep Tendon Reflexes: Reflexes normal.  Psychiatric:     Comments: Athletes foot between toes otherwise normal feet      ED Results / Procedures / Treatments   Labs (all labs ordered are listed, but only abnormal results are displayed) Results for orders placed or performed during the hospital encounter of 09/04/21  SARS Coronavirus 2 by RT PCR (hospital order, performed in [place] hospital lab) *cepheid single result test* Anterior Nasal Swab   Specimen: Anterior Nasal Swab  Result Value Ref Range   SARS Coronavirus 2 by RT PCR NEGATIVE NEGATIVE  Brain natriuretic peptide  Result Value Ref Range   B Natriuretic Peptide 2,911.4 (H) 0.0 - 100.0 pg/mL  Comprehensive metabolic panel  Result Value Ref Range   Sodium 137 135 - 145 mmol/L   Potassium 4.4 3.5 - 5.1 mmol/L   Chloride 96 (L) 98 - 111 mmol/L   CO2 26 22  - 32 mmol/L   Glucose, Bld 96 70 - 99 mg/dL   BUN 45 (H) 6 - 20 mg/dL   Creatinine, Ser 9.38 (H) 0.61 - 1.24 mg/dL   Calcium 9.8 8.9 - 10.3 mg/dL   Total Protein 8.0 6.5 - 8.1 g/dL   Albumin 3.2 (L) 3.5 - 5.0 g/dL   AST 19 15 - 41 U/L   ALT 9 0 - 44 U/L   Alkaline Phosphatase 74 38 - 126 U/L   Total Bilirubin 1.0 0.3 - 1.2 mg/dL  GFR, Estimated 6 (L) >60 mL/min   Anion gap 15 5 - 15  CBC with Differential  Result Value Ref Range   WBC 4.1 4.0 - 10.5 K/uL   RBC 4.14 (L) 4.22 - 5.81 MIL/uL   Hemoglobin 11.6 (L) 13.0 - 17.0 g/dL   HCT 38.0 (L) 39.0 - 52.0 %   MCV 91.8 80.0 - 100.0 fL   MCH 28.0 26.0 - 34.0 pg   MCHC 30.5 30.0 - 36.0 g/dL   RDW 18.3 (H) 11.5 - 15.5 %   Platelets 66 (L) 150 - 400 K/uL   nRBC 0.0 0.0 - 0.2 %   Neutrophils Relative % 48 %   Neutro Abs 1.9 1.7 - 7.7 K/uL   Lymphocytes Relative 28 %   Lymphs Abs 1.1 0.7 - 4.0 K/uL   Monocytes Relative 12 %   Monocytes Absolute 0.5 0.1 - 1.0 K/uL   Eosinophils Relative 10 %   Eosinophils Absolute 0.4 0.0 - 0.5 K/uL   Basophils Relative 2 %   Basophils Absolute 0.1 0.0 - 0.1 K/uL   WBC Morphology MORPHOLOGY UNREMARKABLE    RBC Morphology MORPHOLOGY UNREMARKABLE    Smear Review Normal platelet morphology    Immature Granulocytes 0 %   Abs Immature Granulocytes 0.01 0.00 - 0.07 K/uL  Troponin I (High Sensitivity)  Result Value Ref Range   Troponin I (High Sensitivity) 72 (H) <18 ng/L   CT Angio Chest PE W and/or Wo Contrast  Result Date: 09/04/2021 CLINICAL DATA:  Shortness of breath EXAM: CT ANGIOGRAPHY CHEST WITH CONTRAST TECHNIQUE: Multidetector CT imaging of the chest was performed using the standard protocol during bolus administration of intravenous contrast. Multiplanar CT image reconstructions and MIPs were obtained to evaluate the vascular anatomy. RADIATION DOSE REDUCTION: This exam was performed according to the departmental dose-optimization program which includes automated exposure control, adjustment  of the mA and/or kV according to patient size and/or use of iterative reconstruction technique. CONTRAST:  66m OMNIPAQUE IOHEXOL 350 MG/ML SOLN COMPARISON:  Noncontrast chest CT 06/21/2021 FINDINGS: Cardiovascular: Cardiomegaly. Preferential dilatation of the right heart with 1.74 RV to LV ratio and hepatic venous reflux, likely chronic when compared to prior and when considering the pulmonary arteries. There is non enhancement asymmetrically involving right lower lobe segmental arteries. Some mixing artifact is noted within the pulmonary arteries, worse in the right lung. Pulmonary arteries show chronic diffuse wall calcification which is linear. Atheromatous calcification. Perma catheter on the left in good position. Mediastinum/Nodes: Negative for adenopathy or mass. Lungs/Pleura: Pleural thickening with calcification asymmetrically involving the right hemithorax. There are bands of opacity in the bilateral lungs from atelectasis/scarring, similar to prior. Improvement in left lower lobe aeration since prior. Upper Abdomen: No acute finding. Partially covered polycystic kidneys. Musculoskeletal: Generalized osteopenia. Review of the MIP images confirms the above findings. Critical Value/emergent results were called by telephone at the time of interpretation on 09/04/2021 at 4:45 am to provider [facility], who verbally acknowledged these results. IMPRESSION: 1. Positive for right lower lobe segmental branch clot. There is a background of pulmonary hypertension with dilated right heart and pulmonary artery calcification. 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 Chronic lung disease; improved left lower lobe aeration since June 2023. 3.  Aortic Atherosclerosis (ICD10-I70.0). Electronically Signed   By: JJorje GuildM.D.   On: 09/04/2021 04:47   DG Chest Portable 1 View  Result Date: 09/04/2021 CLINICAL DATA:  Shortness of breath EXAM: PORTABLE CHEST 1 VIEW COMPARISON:  07/10/2021 FINDINGS: Scarring in the  right  mid/lower lung and lingula. Right apical pleural-parenchymal scarring. No focal consolidation. Chronic blunting of the right costophrenic angle. No pleural effusion or pneumothorax. Stable cardiomegaly. Left chest dual lumen dialysis catheter terminating at the cavoatrial junction. IMPRESSION: Stable cardiomegaly. Scattered scarring. No evidence of acute cardiopulmonary disease. Electronically Signed   By: Julian Hy M.D.   On: 09/04/2021 01:51   VAS [place] UPPER EXT VEIN MAPPING (PRE-OP AVF)  Result Date: 08/18/2021 UPPER EXTREMITY VEIN MAPPING Patient Name:  DONTREY SNELLGROVE  Date of Exam:   08/18/2021 Medical Rec #: [id number]        Accession #:    [id number] Date of Birth: 1960-04-14        Patient Gender: M Patient Age:   25 years Exam Location:  [facility] Procedure:      VAS [place] UPPER EXT VEIN MAPPING (PRE-OP AVF) Referring Phys: Karoline Caldwell --------------------------------------------------------------------------------  Indications: Pre-access. History: Ligation of left arteriovenous fistula 06/20/2021. Right forearm graft  Limitations: Left upper arm contracted Performing Technologist: Alvia Grove RVT  Examination Guidelines: A complete evaluation includes B-mode imaging, spectral Doppler, color Doppler, and power Doppler as needed of all accessible portions of each vessel. Bilateral testing is considered an integral part of a complete examination. Limited examinations for reoccurring indications may be performed as noted. +-----------------+-------------+----------+--------------+ Right Cephalic   Diameter (cm)Depth (cm)   Findings    +-----------------+-------------+----------+--------------+ Shoulder                                not visualized +-----------------+-------------+----------+--------------+ Prox upper arm                          not visualized +-----------------+-------------+----------+--------------+ Mid upper arm                           not  visualized +-----------------+-------------+----------+--------------+ Dist upper arm                          not visualized +-----------------+-------------+----------+--------------+ Antecubital fossa    0.14               not visualized +-----------------+-------------+----------+--------------+ Prox forearm                            not visualized +-----------------+-------------+----------+--------------+ Mid forearm          0.11                   graft      +-----------------+-------------+----------+--------------+ +-----------------+-------------+----------+--------+ Right Basilic    Diameter (cm)Depth (cm)Findings +-----------------+-------------+----------+--------+ Prox upper arm       0.53                        +-----------------+-------------+----------+--------+ Mid upper arm        0.45                        +-----------------+-------------+----------+--------+ Dist upper arm       0.24                        +-----------------+-------------+----------+--------+ Antecubital fossa    0.18                        +-----------------+-------------+----------+--------+  Prox forearm         0.18                        +-----------------+-------------+----------+--------+ Summary:   Measurements above. *See table(s) above for measurements and observations.  Diagnosing physician: Orlie Pollen Electronically signed by Orlie Pollen on 08/18/2021 at 5:38:01 PM.    Final    VAS [place] UPPER EXTREMITY ARTERIAL DUPLEX  Result Date: 08/18/2021  UPPER EXTREMITY DUPLEX STUDY Patient Name:  LELA GELL  Date of Exam:   08/18/2021 Medical Rec #: [id number]        Accession #:    [id number] Date of Birth: [birth year]        Patient Gender: M Patient Age:   66 years Exam Location:  [facility] Procedure:      VAS [place] UPPER EXTREMITY ARTERIAL DUPLEX Referring Phys: Vonna Kotyk ROBINS  --------------------------------------------------------------------------------  Indications: Pre-operative exam.  Risk Factors:  Hypertension, Diabetes, past history of smoking, prior CVA. Other Factors: 06/20/2021 Ligation of arteriovenous fistula on the left. Right                forearm graft Limitations: Left arm contracted. Performing Technologist: Alvia Grove RVT  Examination Guidelines: A complete evaluation includes B-mode imaging, spectral Doppler, color Doppler, and power Doppler as needed of all accessible portions of each vessel. Bilateral testing is considered an integral part of a complete examination. Limited examinations for reoccurring indications may be performed as noted.  Right Pre-Dialysis Findings: +-----------------------+----------+--------------------+---------+--------+ Location               PSV (cm/s)Intralum. Diam. (cm)Waveform Comments +-----------------------+----------+--------------------+---------+--------+ Brachial Antecub. fossa40        0.74                triphasic         +-----------------------+----------+--------------------+---------+--------+ Radial Art at Wrist    56        0.26                triphasic         +-----------------------+----------+--------------------+---------+--------+ Ulnar Art at Wrist     50        0.15                triphasic         +-----------------------+----------+--------------------+---------+--------+  Left Pre-Dialysis Findings: +-----------------------+----------+--------------------+----------+--------+ Location               PSV (cm/s)Intralum. Diam. (cm)Waveform  Comments +-----------------------+----------+--------------------+----------+--------+ Brachial Antecub. fossa41        0.50                triphasic          +-----------------------+----------+--------------------+----------+--------+ Radial Art at Wrist    35        0.17                biphasic            +-----------------------+----------+--------------------+----------+--------+ Ulnar Art at Wrist     15        0.17                monophasicbrisk    +-----------------------+----------+--------------------+----------+--------+  Summary:   Measurements above.  NOTE: This was a technically difficult exam. *See table(s) above for measurements and observations. Electronically signed by Orlie Pollen on 08/18/2021 at 5:32:56 PM.    Final      EKG EKG Interpretation  Date/Time:  Monday September 04 2021 01:23:49 EDT  Ventricular Rate:  89 PR Interval:    QRS Duration: 131 QT Interval:  431 QTC Calculation: 525 R Axis:   27 Text Interpretation: Atrial fibrillation Nonspecific intraventricular conduction delay Probable lateral infarct, seen on or before july 3 Probable anteroseptal infarct, old Confirmed by Randal Buba, Wlliam Grosso ([id number]) on 09/04/2021 2:42:37 AM  Radiology DG Chest Portable 1 View  Result Date: 09/04/2021 CLINICAL DATA:  Shortness of breath EXAM: PORTABLE CHEST 1 VIEW COMPARISON:  07/10/2021 FINDINGS: Scarring in the right mid/lower lung and lingula. Right apical pleural-parenchymal scarring. No focal consolidation. Chronic blunting of the right costophrenic angle. No pleural effusion or pneumothorax. Stable cardiomegaly. Left chest dual lumen dialysis catheter terminating at the cavoatrial junction. IMPRESSION: Stable cardiomegaly. Scattered scarring. No evidence of acute cardiopulmonary disease. Electronically Signed   By: Julian Hy M.D.   On: 09/04/2021 01:51    Procedures Procedures    Medications Ordered in ED Medications  heparin ADULT infusion 100 units/mL (25000 units/229m) (has no administration in time range)  metoprolol tartrate (LOPRESSOR) injection 2.5 mg (has no administration in time range)  iohexol (OMNIPAQUE) 350 MG/ML injection 75 mL (75 mLs Intravenous Contrast Given 09/04/21 0404)    ED Course/ Medical Decision Making/ A&P                            Medical Decision Making Patient with ESRD TTS and AFIB on eliquis presents with last dose on Saturday presents with SOB x 1 month   Problems Addressed: Elevated troponin I level:    Details: Could be cardiac or pulmonary or secondary to ESRD.  Cardiology will consult.  Will need an echo  ESRD (end stage renal disease) ([facility]:    Details: Will require dialysis on Tuesday  Other acute pulmonary embolism without acute cor pulmonale ([facility]:    Details: Heparin IV initiated by me, hospitalist updated  SOB (shortness of breath):    Details: PE and fibrothorax, but patient is having non-sustained VT which can also cause SOB.  Lopressor started to prevent bursts of VT  Amount and/or Complexity of Data Reviewed Independent Historian: spouse    Details: See above  External Data Reviewed: labs and notes.    Details: Previous notes reviewed platelet counts as low as 40K and as high as 109K.   Labs: ordered.    Details: All labs reviewed: negative covid.  Elevated BNP 2911.4, elevated troponin 72.  Normal sodium and potassium elevated bun 45 and creatinine 9.38.  Normal white count 4.1 and hemoglobin low 11.6 low platelets 66k Radiology: ordered and independent interpretation performed.    Details: PE by me on CTA, CXR negative for CHF by me  ECG/medicine tests: ordered and independent interpretation performed. Decision-making details documented in ED Course. Discussion of management or test interpretation with external provider(s): Case d/w Dr. KHumphrey Rollsof cardiology.  Admit to medicine cardiology will consult.  Start with lopressor to suppress VT if fails start amiodarone drip Case d/w Dr. SCyd Silence  Risk Prescription drug management. Decision regarding hospitalization. Risk Details: Informed by pharmacist that patient had missed getting several months refills of Eliquis.  Reportedly patient did not have from mid [place] until this past week.  This may have been the reason patient has a PE on CTA as  patient has paroxsymal Atrial fibrillation.  Patient has been started on heparin by me in the ED.  Monitor continues to demonstrate PVCs but there are no further runs of NSVT  Critical Care  Total time providing critical care: 60 minutes (Heparin drip, multiple consults and interventions with a high degree of complexity.  )    Final Clinical Impression(s) / ED Diagnoses Final diagnoses:  Thrombocytopenia (HCC)  Elevated troponin I level  SOB (shortness of breath)  ESRD (end stage renal disease) ([place])  Other acute pulmonary embolism without acute cor pulmonale (HCC)   The patient appears reasonably stabilized for admission considering the current resources, flow, and capabilities available in the ED at this time, and I doubt any other [facility] requiring further screening and/or treatment in the ED prior to admission.     Tavin Vernet, MD 09/04/21 [phone]

## 2021-09-04 NOTE — ED Notes (Signed)
Report called [place] RN

## 2021-09-04 NOTE — ED Notes (Signed)
Patient with bilateral clear, diminished breath sounds at this time. HR 90, SATs 96% on RA.

## 2021-09-04 NOTE — ED Notes (Signed)
Pt with 7 beat run of VT. EDP notified.

## 2021-09-05 ENCOUNTER — Observation Stay (HOSPITAL_COMMUNITY): Payer: Medicare Other

## 2021-09-05 DIAGNOSIS — I2694 Multiple subsegmental pulmonary emboli without acute cor pulmonale: Secondary | ICD-10-CM | POA: Diagnosis not present

## 2021-09-05 DIAGNOSIS — I472 Ventricular tachycardia, unspecified: Secondary | ICD-10-CM | POA: Diagnosis present

## 2021-09-05 DIAGNOSIS — R778 Other specified abnormalities of plasma proteins: Secondary | ICD-10-CM | POA: Diagnosis not present

## 2021-09-05 DIAGNOSIS — J449 Chronic obstructive pulmonary disease, unspecified: Secondary | ICD-10-CM | POA: Diagnosis present

## 2021-09-05 DIAGNOSIS — F419 Anxiety disorder, unspecified: Secondary | ICD-10-CM | POA: Diagnosis present

## 2021-09-05 DIAGNOSIS — Z79899 Other long term (current) drug therapy: Secondary | ICD-10-CM | POA: Diagnosis not present

## 2021-09-05 DIAGNOSIS — R0602 Shortness of breath: Secondary | ICD-10-CM | POA: Diagnosis present

## 2021-09-05 DIAGNOSIS — I480 Paroxysmal atrial fibrillation: Secondary | ICD-10-CM | POA: Diagnosis present

## 2021-09-05 DIAGNOSIS — D696 Thrombocytopenia, unspecified: Secondary | ICD-10-CM | POA: Diagnosis present

## 2021-09-05 DIAGNOSIS — I69354 Hemiplegia and hemiparesis following cerebral infarction affecting left non-dominant side: Secondary | ICD-10-CM | POA: Diagnosis not present

## 2021-09-05 DIAGNOSIS — I493 Ventricular premature depolarization: Secondary | ICD-10-CM | POA: Diagnosis present

## 2021-09-05 DIAGNOSIS — Z87891 Personal history of nicotine dependence: Secondary | ICD-10-CM | POA: Diagnosis not present

## 2021-09-05 DIAGNOSIS — Z888 Allergy status to other drugs, medicaments and biological substances status: Secondary | ICD-10-CM | POA: Diagnosis not present

## 2021-09-05 DIAGNOSIS — E1151 Type 2 diabetes mellitus with diabetic peripheral angiopathy without gangrene: Secondary | ICD-10-CM | POA: Diagnosis present

## 2021-09-05 DIAGNOSIS — D631 Anemia in chronic kidney disease: Secondary | ICD-10-CM | POA: Diagnosis present

## 2021-09-05 DIAGNOSIS — I2699 Other pulmonary embolism without acute cor pulmonale: Secondary | ICD-10-CM

## 2021-09-05 DIAGNOSIS — I132 Hypertensive heart and chronic kidney disease with heart failure and with stage 5 chronic kidney disease, or end stage renal disease: Secondary | ICD-10-CM | POA: Diagnosis present

## 2021-09-05 DIAGNOSIS — I5022 Chronic systolic (congestive) heart failure: Secondary | ICD-10-CM | POA: Diagnosis present

## 2021-09-05 DIAGNOSIS — N186 End stage renal disease: Secondary | ICD-10-CM | POA: Diagnosis present

## 2021-09-05 DIAGNOSIS — Z992 Dependence on renal dialysis: Secondary | ICD-10-CM | POA: Diagnosis not present

## 2021-09-05 DIAGNOSIS — D638 Anemia in other chronic diseases classified elsewhere: Secondary | ICD-10-CM | POA: Diagnosis not present

## 2021-09-05 DIAGNOSIS — I4729 Other ventricular tachycardia: Secondary | ICD-10-CM | POA: Diagnosis not present

## 2021-09-05 DIAGNOSIS — I2609 Other pulmonary embolism with acute cor pulmonale: Secondary | ICD-10-CM | POA: Diagnosis not present

## 2021-09-05 DIAGNOSIS — Z885 Allergy status to narcotic agent status: Secondary | ICD-10-CM | POA: Diagnosis not present

## 2021-09-05 DIAGNOSIS — I272 Pulmonary hypertension, unspecified: Secondary | ICD-10-CM | POA: Diagnosis present

## 2021-09-05 DIAGNOSIS — E1122 Type 2 diabetes mellitus with diabetic chronic kidney disease: Secondary | ICD-10-CM | POA: Diagnosis present

## 2021-09-05 DIAGNOSIS — I5020 Unspecified systolic (congestive) heart failure: Secondary | ICD-10-CM | POA: Diagnosis not present

## 2021-09-05 DIAGNOSIS — Z20822 Contact with and (suspected) exposure to covid-19: Secondary | ICD-10-CM | POA: Diagnosis present

## 2021-09-05 DIAGNOSIS — I9589 Other hypotension: Secondary | ICD-10-CM | POA: Diagnosis present

## 2021-09-05 DIAGNOSIS — I428 Other cardiomyopathies: Secondary | ICD-10-CM | POA: Diagnosis present

## 2021-09-05 DIAGNOSIS — Z7951 Long term (current) use of inhaled steroids: Secondary | ICD-10-CM | POA: Diagnosis not present

## 2021-09-05 DIAGNOSIS — E119 Type 2 diabetes mellitus without complications: Secondary | ICD-10-CM | POA: Diagnosis not present

## 2021-09-05 LAB — CBC
HCT: 37.2 % — ABNORMAL LOW (ref 39.0–52.0)
Hemoglobin: 11.4 g/dL — ABNORMAL LOW (ref 13.0–17.0)
MCH: 28 pg (ref 26.0–34.0)
MCHC: 30.6 g/dL (ref 30.0–36.0)
MCV: 91.4 fL (ref 80.0–100.0)
Platelets: 70 10*3/uL — ABNORMAL LOW (ref 150–400)
RBC: 4.07 MIL/uL — ABNORMAL LOW (ref 4.22–5.81)
RDW: 18 % — ABNORMAL HIGH (ref 11.5–15.5)
WBC: 4.1 10*3/uL (ref 4.0–10.5)
nRBC: 0 % (ref 0.0–0.2)

## 2021-09-05 LAB — COMPREHENSIVE METABOLIC PANEL
ALT: 9 U/L (ref 0–44)
AST: 18 U/L (ref 15–41)
Albumin: 2.9 g/dL — ABNORMAL LOW (ref 3.5–5.0)
Alkaline Phosphatase: 66 U/L (ref 38–126)
Anion gap: 15 (ref 5–15)
BUN: 54 mg/dL — ABNORMAL HIGH (ref 6–20)
CO2: 23 mmol/L (ref 22–32)
Calcium: 9.6 mg/dL (ref 8.9–10.3)
Chloride: 97 mmol/L — ABNORMAL LOW (ref 98–111)
Creatinine, Ser: 11.54 mg/dL — ABNORMAL HIGH (ref 0.61–1.24)
GFR, Estimated: 5 mL/min — ABNORMAL LOW (ref >60)
Glucose, Bld: 92 mg/dL (ref 70–99)
Potassium: 4.8 mmol/L (ref 3.5–5.1)
Sodium: 135 mmol/L (ref 135–145)
Total Bilirubin: 0.9 mg/dL (ref 0.3–1.2)
Total Protein: 7.7 g/dL (ref 6.5–8.1)

## 2021-09-05 LAB — ECHOCARDIOGRAM COMPLETE
AR max vel: 1.65 cm2
AV Area VTI: 1.47 cm2
AV Area mean vel: 1.38 cm2
AV Mean grad: 2 mmHg
AV Peak grad: 4.5 mmHg
Ao pk vel: 1.06 m/s
Calc EF: 32.5 %
Height: 69 in
MV M vel: 4.46 m/s
MV Peak grad: 79.6 mmHg
Radius: 0.8 cm
S' Lateral: 4.6 cm
Single Plane A2C EF: 37.8 %
Single Plane A4C EF: 31.7 %
Weight: 2652.57 oz

## 2021-09-05 LAB — HEMOGLOBIN A1C
Hgb A1c MFr Bld: 5.2 % (ref 4.8–5.6)
Mean Plasma Glucose: 102.54 mg/dL

## 2021-09-05 LAB — HEPATITIS B CORE ANTIBODY, TOTAL: Hep B Core Total Ab: NONREACTIVE

## 2021-09-05 LAB — GLUCOSE, CAPILLARY
Glucose-Capillary: 98 mg/dL (ref 70–99)
Glucose-Capillary: 90 mg/dL (ref 70–99)
Glucose-Capillary: 91 mg/dL (ref 70–99)
Glucose-Capillary: 58 mg/dL — ABNORMAL LOW (ref 70–99)
Glucose-Capillary: 147 mg/dL — ABNORMAL HIGH (ref 70–99)

## 2021-09-05 LAB — APTT: aPTT: 133 seconds — ABNORMAL HIGH (ref 24–36)

## 2021-09-05 LAB — HEPATITIS B SURFACE ANTIGEN: Hepatitis B Surface Ag: NONREACTIVE

## 2021-09-05 LAB — TROPONIN I (HIGH SENSITIVITY): Troponin I (High Sensitivity): 62 ng/L — ABNORMAL HIGH (ref <18)

## 2021-09-05 LAB — HEPARIN LEVEL (UNFRACTIONATED)
Heparin Unfractionated: 0.86 IU/mL — ABNORMAL HIGH (ref 0.30–0.70)
Heparin Unfractionated: 0.96 IU/mL — ABNORMAL HIGH (ref 0.30–0.70)

## 2021-09-05 LAB — HEPATITIS B SURFACE ANTIBODY,QUALITATIVE: Hep B S Ab: REACTIVE — AB

## 2021-09-05 LAB — HIV ANTIBODY (ROUTINE TESTING W REFLEX): HIV Screen 4th Generation wRfx: NONREACTIVE

## 2021-09-05 MED ORDER — HYDRALAZINE HCL 20 MG/ML IJ SOLN: 10.0000 mg | INTRAMUSCULAR | Status: DC | PRN

## 2021-09-05 MED ORDER — SENNOSIDES-DOCUSATE SODIUM 8.6-50 MG PO TABS: 1.0000 | ORAL_TABLET | Freq: Every evening | ORAL | Status: DC | PRN

## 2021-09-05 MED ORDER — CHLORHEXIDINE GLUCONATE CLOTH 2 % EX PADS
6.0000 | MEDICATED_PAD | Freq: Every day | CUTANEOUS | Status: DC
  Administered 2021-09-05 – 2021-09-06 (×2): 6 via TOPICAL

## 2021-09-05 MED ORDER — TRAZODONE HCL 50 MG PO TABS: 50.0000 mg | ORAL_TABLET | Freq: Every evening | ORAL | Status: DC | PRN

## 2021-09-05 MED ORDER — IPRATROPIUM-ALBUTEROL 0.5-2.5 (3) MG/3ML IN SOLN
3.0000 mL | Freq: Four times a day (QID) | RESPIRATORY_TRACT | Status: DC
  Administered 2021-09-05: 3 mL via RESPIRATORY_TRACT
  Filled 2021-09-05: qty 3

## 2021-09-05 MED ORDER — PERFLUTREN LIPID MICROSPHERE
1.0000 mL | INTRAVENOUS | Status: AC | PRN
  Administered 2021-09-05: 3 mL via INTRAVENOUS

## 2021-09-05 MED ORDER — HEPARIN SODIUM (PORCINE) 1000 UNIT/ML IJ SOLN
INTRAMUSCULAR | Status: AC
  Administered 2021-09-05: 1.9 [IU]
  Filled 2021-09-05: qty 4

## 2021-09-05 MED ORDER — METOPROLOL TARTRATE 5 MG/5ML IV SOLN: 5.0000 mg | INTRAVENOUS | Status: DC | PRN

## 2021-09-05 MED ORDER — GUAIFENESIN 100 MG/5ML PO LIQD: 5.0000 mL | ORAL | Status: DC | PRN

## 2021-09-05 MED ORDER — MIDODRINE HCL 5 MG PO TABS
5.0000 mg | ORAL_TABLET | Freq: Three times a day (TID) | ORAL | Status: DC
  Administered 2021-09-05 – 2021-09-08 (×10): 5 mg via ORAL
  Filled 2021-09-05 (×11): qty 1

## 2021-09-05 MED ORDER — IPRATROPIUM-ALBUTEROL 0.5-2.5 (3) MG/3ML IN SOLN: 3.0000 mL | RESPIRATORY_TRACT | Status: DC | PRN

## 2021-09-05 NOTE — Progress Notes (Signed)
ANTICOAGULATION CONSULT NOTE  Pharmacy Consult for Heparin Indication: acute  pulmonary embolus    Allergies  Allergen Reactions   Codeine Shortness Of Breath   Dextromethorphan-Guaifenesin Shortness Of Breath   Iron Dextran Shortness Of Breath   Morphine And Related Shortness Of Breath    Patient Measurements: Height: '5\' 9"'$  (175.3 cm) Weight: 73.9 kg (162 lb 14.7 oz) IBW/kg (Calculated) : 70.7  Vital Signs: Temp: 97.5 F (36.4 C) (08/29 2107) Temp Source: Oral (08/29 2107) BP: 107/70 (08/29 2107) Pulse Rate: 84 (08/29 2107)  Labs: Recent Labs    09/04/21 0100 09/04/21 0454 09/04/21 1325 09/04/21 2216 09/05/21 0116 09/05/21 0817 09/05/21 0825 09/05/21 2118  HGB 11.6*  --   --   --   --   --  11.4*  --   HCT 38.0*  --   --   --   --   --  37.2*  --   PLT 66*  --   --   --   --   --  70*  --   APTT  --   --  100* 55*  --   --  133*  --   HEPARINUNFRC  --   --  0.93*  --   --  0.86*  --  0.96*  CREATININE 9.38*  --   --   --   --   --  11.54*  --   TROPONINIHS 72* 69*  --  80* 62*  --   --   --      Estimated Creatinine Clearance: 6.9 mL/min (A) (by C-G formula based on SCr of 11.54 mg/dL (H)).   Assessment: 61 y.o. male admitted with shortness of breath and PE for  heparin.  Pt has h/o Afib on Eliquis, questionable compliance.  MD noted patient has not taking Eliquis in last month.   Earlier today heparin drip rate decreases 1250 uts/hr with heparin level 0.96 > goal  Will verify location of heparin infusion and location of lab draw  This AM 09/05/21:   HL = 0.86, aPTT 133 seconds - slightly > goal  on heparin 1400 unit/hr.    Not taking Eliquis PTA for last month. I will discontinue the  PTTs and now use hepairn levels to monitor heparin infusion.    Hgb 11.4 stable, plts 66>70 (chronic,  was 47k on 06/22/21). No bleeding noted per RN.  09/04/21 CT angio chest : + acute PE 09/05/21 LE dopplers done :  prelim results indicate negative  bilateral lower  extremities.  Goal of Therapy:  Heparin level 0.3-0.7 units/ml Monitor platelets by anticoagulation protocol: Yes   Plan:  Decrease Heparin to 1200 units/hr Daily HL and CBC     Bonnita Nasuti Pharm.D. CPP, BCPS Clinical Pharmacist [phone] 09/05/2021 10:31 PM   Please check AMION for all [place] phone numbers After 10:00 PM, call [place] [phone]

## 2021-09-05 NOTE — Progress Notes (Addendum)
ANTICOAGULATION CONSULT NOTE  Pharmacy Consult for Heparin Indication: acute  pulmonary embolus    Allergies  Allergen Reactions   Codeine Shortness Of Breath   Dextromethorphan-Guaifenesin Shortness Of Breath   Iron Dextran Shortness Of Breath   Morphine And Related Shortness Of Breath    Patient Measurements: Height: '5\' 9"'$  (175.3 cm) Weight: 75.2 kg (165 lb 12.6 oz) IBW/kg (Calculated) : 70.7  Vital Signs: Temp: 97.9 F (36.6 C) (08/29 1107) Temp Source: Oral (08/29 1107) BP: 121/84 (08/29 1107) Pulse Rate: 72 (08/29 1107)  Labs: Recent Labs    09/04/21 0100 09/04/21 0454 09/04/21 1325 09/04/21 2216 09/05/21 0116 09/05/21 0817 09/05/21 0825  HGB 11.6*  --   --   --   --   --  11.4*  HCT 38.0*  --   --   --   --   --  37.2*  PLT 66*  --   --   --   --   --  70*  APTT  --   --  100* 55*  --   --  133*  HEPARINUNFRC  --   --  0.93*  --   --  0.86*  --   CREATININE 9.38*  --   --   --   --   --  11.54*  TROPONINIHS 72* 69*  --  80* 62*  --   --      Estimated Creatinine Clearance: 6.9 mL/min (A) (by C-G formula based on SCr of 11.54 mg/dL (H)).   Assessment: 61 y.o. male admitted with shortness of breath and PE for  heparin.  Pt has h/o Afib on Eliquis, questionable compliance.  MD noted patient has not taking Eliquis in last month.  This AM 09/05/21:   HL = 0.86, aPTT 133 seconds on heparin 1400 unit/hr.    Not taking Eliquis PTA for last month. I will discontinue the  PTTs and now use hepairn levels to monitor heparin infusion.  Goal HL 0.3-0.7  units/ml.   Hgb 11.4 stable, plts 66>70 (chronic,  was 47k on 06/22/21). No bleeding noted per RN.  09/04/21 CT angio chest : + acute PE 09/05/21 LE dopplers done :  prelim results indicate negative  bilateral lower extremities.  Goal of Therapy:  Heparin level 0.3-0.7 units/ml Monitor platelets by anticoagulation protocol: Yes   Plan:  Decrease Heparin to 1250 units/hr Check 8 hr HL Daily HL and CBC   Nicole Cella,  RPh Clinical Pharmacist [phone] 09/05/2021,11:51 AM Please check AMION for all [place] phone numbers After 10:00 PM, call [place] [phone]

## 2021-09-05 NOTE — Progress Notes (Signed)
Pt receives out-pt HD at [facility] on TTS. Pt arrives at 6:55 am for 7:15 chair time. Will assist as needed.   Melven Sartorius Renal Navigator [phone]

## 2021-09-05 NOTE — Progress Notes (Signed)
PROGRESS NOTE    Nathaniel White  URK:[id number] DOB: 1960-09-03 DOA: 09/04/2021 PCP: Gildardo Pounds, NP   Brief Narrative:  61 year old with history of ESRD on HD, nonischemic cardiomyopathy with EF 35%, severe pulmonary hypertension with RV failure, paroxysmal A-fib, prior history of CVA with residual left-sided weakness, recent history of necrotizing pneumonia comes to the hospital with progressive shortness of breath for 1 month.  He has been prescribed Eliquis but has not taken it in the last month.  In the ED was noted to have brief run of nonsustained V. tach.  CTA chest was positive for PE and started on heparin drip.  Troponin mildly elevated, BNP elevated   Assessment & Plan:  Principal Problem:   Acute pulmonary embolism (HCC) Active Problems:   ESRD (end stage renal disease) (HCC)   HFrEF (heart failure with reduced ejection fraction) (HCC)   NSVT (nonsustained ventricular tachycardia) (HCC)   Type 2 diabetes mellitus (HCC)   Thrombocytopenia (HCC)   Shortness of breath   Pulmonary embolism (HCC)    Respiratory distress Acute right lower lobe pulmonary embolism - Admit patient to the hospital.  Has elevated BNP and troponin levels.  Lower extremity Dopplers negative for DVT.  Currently on heparin drip.  Echocardiogram shows EF of 30% with global hypokinesia, severely elevated PA systolic pressures, severely dilated left and right atrium, moderate to severe MR  History of paroxysmal atrial fibrillation - Not on any rate control medication.  Was prescribed Eliquis but has not taken it in 1 month.  Brief episode of NSVT - Echo done as mentioned above monitor electrolytes and replete.  Admitting provider notified cardiology recommended observation for now.  He had similar episodes back in June when he was admitted for necrotizing pneumonia.  ESRD on hemodialysis TTS - We will consult nephrology  Chronic hypotension - Midodrine 3 times daily  Diabetes mellitus type  2 - Does not appear to be any medication.  A1c pending.  Anemia of chronic disease and thrombocytopenia - Closely monitor  Prior history of CVA with residual left-sided weakness      DVT prophylaxis: Currently on heparin drip Code Status: Full code Family Communication:    It is still quite hypoxic and dyspneic on exertion.  Maintain hospital stay.    Subjective: Seen and examined at bedside, still getting tachycardic and hypoxic with speaking in long sentences even at rest.  He does not use any oxygen at home.  Examination:  General exam: Appears calm and comfortable, 3 L nasal cannula Respiratory system: 5 basilar crackles Cardiovascular system: S1 & S2 heard, RRR. No JVD, murmurs, rubs, gallops or clicks. No pedal edema. Gastrointestinal system: Abdomen is nondistended, soft and nontender. No organomegaly or masses felt. Normal bowel sounds heard. Central nervous system: Alert and oriented. No focal neurological deficits. Extremities: Symmetric 5 x 5 power. Skin: No rashes, lesions or ulcers Psychiatry: Judgement and insight appear normal. Mood & affect appropriate.     Objective: Vitals:   09/04/21 1934 09/04/21 2050 09/05/21 0024 09/05/21 0421  BP:   104/78 98/70  Pulse:   70 74  Resp:  (!) 21 (!) 21 (!) 24  Temp: 97.6 F (36.4 C) 97.8 F (36.6 C) 97.8 F (36.6 C) 97.6 F (36.4 C)  TempSrc: Oral Axillary Oral Oral  SpO2:  100% 95% 97%  Weight:  75.2 kg 75.2 kg   Height:       No intake or output data in the 24 hours ending 09/05/21 0723 Filed  Weights   09/04/21 0038 09/04/21 2050 09/05/21 0024  Weight: 79.4 kg 75.2 kg 75.2 kg     Data Reviewed:   CBC: Recent Labs  Lab 09/04/21 0100  WBC 4.1  NEUTROABS 1.9  HGB 11.6*  HCT 38.0*  MCV 91.8  PLT 66*   Basic Metabolic Panel: Recent Labs  Lab 09/04/21 0100  NA 137  K 4.4  CL 96*  CO2 26  GLUCOSE 96  BUN 45*  CREATININE 9.38*  CALCIUM 9.8   GFR: Estimated Creatinine Clearance: 8.5  mL/min (A) (by C-G formula based on SCr of 9.38 mg/dL (H)). Liver Function Tests: Recent Labs  Lab 09/04/21 0100  AST 19  ALT 9  ALKPHOS 74  BILITOT 1.0  PROT 8.0  ALBUMIN 3.2*   No results for input(s): "LIPASE", "AMYLASE" in the last 168 hours. No results for input(s): "AMMONIA" in the last 168 hours. Coagulation Profile: No results for input(s): "INR", "PROTIME" in the last 168 hours. Cardiac Enzymes: No results for input(s): "CKTOTAL", "CKMB", "CKMBINDEX", "TROPONINI" in the last 168 hours. BNP (last 3 results) No results for input(s): "PROBNP" in the last 8760 hours. HbA1C: No results for input(s): "HGBA1C" in the last 72 hours. CBG: No results for input(s): "GLUCAP" in the last 168 hours. Lipid Profile: No results for input(s): "CHOL", "HDL", "LDLCALC", "TRIG", "CHOLHDL", "LDLDIRECT" in the last 72 hours. Thyroid Function Tests: No results for input(s): "TSH", "T4TOTAL", "FREET4", "T3FREE", "THYROIDAB" in the last 72 hours. Anemia Panel: No results for input(s): "VITAMINB12", "FOLATE", "FERRITIN", "TIBC", "IRON", "RETICCTPCT" in the last 72 hours. Sepsis Labs: No results for input(s): "PROCALCITON", "LATICACIDVEN" in the last 168 hours.  Recent Results (from the past 240 hour(s))  SARS Coronavirus 2 by RT PCR (hospital order, performed in [facility] lab) *cepheid single result test* Anterior Nasal Swab     Status: None   Collection Time: 09/04/21  2:22 AM   Specimen: Anterior Nasal Swab  Result Value Ref Range Status   SARS Coronavirus 2 by RT PCR NEGATIVE NEGATIVE Final    Comment: (NOTE) SARS-CoV-2 target nucleic acids are NOT DETECTED.  The SARS-CoV-2 RNA is generally detectable in upper and lower respiratory specimens during the acute phase of infection. The lowest concentration of SARS-CoV-2 viral copies this assay can detect is 250 copies / mL. A negative result does not preclude SARS-CoV-2 infection and should not be used as the sole basis for  treatment or other patient management decisions.  A negative result may occur with improper specimen collection / handling, submission of specimen other than nasopharyngeal swab, presence of viral mutation(s) within the areas targeted by this assay, and inadequate number of viral copies (<250 copies / mL). A negative result must be combined with clinical observations, patient history, and epidemiological information.  Fact Sheet for Patients:   https://www.patel.info/  Fact Sheet for Healthcare Providers: https://hall.com/  This test is not yet approved or  cleared by the [place] FDA and has been authorized for detection and/or diagnosis of SARS-CoV-2 by FDA under an Emergency Use Authorization (EUA).  This EUA will remain in effect (meaning this test can be used) for the duration of the COVID-19 declaration under Section 564(b)(1) of the Act, 21 U.S.C. section 360bbb-3(b)(1), unless the authorization is terminated or revoked sooner.  Performed at [facility], [address], [place], [place] [address]          Radiology Studies: CT Angio Chest PE W and/or Wo Contrast  Result Date: 09/04/2021  CLINICAL DATA:  Shortness of breath EXAM: CT ANGIOGRAPHY CHEST WITH CONTRAST TECHNIQUE: Multidetector CT imaging of the chest was performed using the standard protocol during bolus administration of intravenous contrast. Multiplanar CT image reconstructions and MIPs were obtained to evaluate the vascular anatomy. RADIATION DOSE REDUCTION: This exam was performed according to the departmental dose-optimization program which includes automated exposure control, adjustment of the mA and/or kV according to patient size and/or use of iterative reconstruction technique. CONTRAST:  37m OMNIPAQUE IOHEXOL 350 MG/ML SOLN COMPARISON:  Noncontrast chest CT 06/21/2021 FINDINGS: Cardiovascular: Cardiomegaly. Preferential dilatation of the right  heart with 1.74 RV to LV ratio and hepatic venous reflux, likely chronic when compared to prior and when considering the pulmonary arteries. There is non enhancement asymmetrically involving right lower lobe segmental arteries. Some mixing artifact is noted within the pulmonary arteries, worse in the right lung. Pulmonary arteries show chronic diffuse wall calcification which is linear. Atheromatous calcification. Perma catheter on the left in good position. Mediastinum/Nodes: Negative for adenopathy or mass. Lungs/Pleura: Pleural thickening with calcification asymmetrically involving the right hemithorax. There are bands of opacity in the bilateral lungs from atelectasis/scarring, similar to prior. Improvement in left lower lobe aeration since prior. Upper Abdomen: No acute finding. Partially covered polycystic kidneys. Musculoskeletal: Generalized osteopenia. Review of the MIP images confirms the above findings. Critical Value/emergent results were called by telephone at the time of interpretation on 09/04/2021 at 4:45 am [facility]ally [facility] these results. IMPRESSION: 1. Positive for right lower lobe segmental branch clot. There is a background of pulmonary hypertension with dilated right heart and pulmonary artery calcification. 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 Chronic lung disease; improved left lower lobe aeration since June 2023. 3.  Aortic Atherosclerosis (ICD10-I70.0). Electronically Signed   By: JJorje GuildM.D.   On: 09/04/2021 04:47   DG Chest Portable 1 View  Result Date: 09/04/2021 CLINICAL DATA:  Shortness of breath EXAM: PORTABLE CHEST 1 VIEW COMPARISON:  07/10/2021 FINDINGS: Scarring in the right mid/lower lung and lingula. Right apical pleural-parenchymal scarring. No focal consolidation. Chronic blunting of the right costophrenic angle. No pleural effusion or pneumothorax. Stable cardiomegaly. Left chest dual lumen dialysis catheter terminating at the cavoatrial  junction. IMPRESSION: Stable cardiomegaly. Scattered scarring. No evidence of acute cardiopulmonary disease. Electronically Signed   By: SJulian HyM.D.   On: 09/04/2021 01:51        Scheduled Meds:  Chlorhexidine Gluconate Cloth  6 each Topical Daily   insulin aspart  0-6 Units Subcutaneous TID WC   midodrine  5 mg Oral TID WC   Continuous Infusions:  heparin 1,400 Units/hr (09/05/21 0148)     LOS: 0 days   Time spent= 35 mins    Marissa Weaver CArsenio Loader MD [facility]  If 7PM-7AM, please contact night-coverage  09/05/2021, 7:23 AM

## 2021-09-05 NOTE — Progress Notes (Signed)
I assumed care of pt. VSS, no complaints voiced. Treatment running as prescribed. Heparin infusing at 1250 units/hr. Under supervision of N. Rosser Retail banker.

## 2021-09-05 NOTE — Progress Notes (Signed)
Lower extremity venous has been completed.   Preliminary results in CV Proc.   Nathaniel White 09/05/2021 10:48 AM

## 2021-09-05 NOTE — Consult Note (Addendum)
Cardiology Consultation   Patient ID: Nathaniel White MRN: [id number]; DOB: Feb 12, 1960  Admit date: 09/04/2021 Date of Consult: 09/05/2021  PCP:  Gildardo Pounds, NP   [place] [facility] Cardiologist:  Lauree Chandler, MD     Patient Profile:   Nathaniel White is a 61 y.o. male with a hx of ESRD on HD, nonischemic cardiomyopathy (EF 35% in March 2023), pulmonary hypertension, RV failure, paroxysmal atrial fibrillation, prior stroke with left-sided weakness, PAD who is being seen 09/05/2021 for the evaluation of nonsustained v-tach at the request of Dr. Reesa Chew.  History of Present Illness:   Nathaniel White is a 61 year old male with above medical history who is followed by Dr. Angelena Form. Per chart review, patient established care with [facility] in 2015. He was referred for treatment of cardiomyopathy. Echocardiogram on 09/30/2013 showed EF 40-45%, hypokinesis of the inferoseptal myocardium, moderate MR. For evaluation of reduced EF and chest pain, ee underwent LHC on 10/09/2013 that showed no angiographic evidence of CAD and elevated filling pressures.   He was not seen in office again until 11/21/2018.  At that time, patient was doing well and was unsure why he was referred to cardiology. After that appointment, patient was lost to follow up.   He was admitted to the hospital in 03/2021 for planned revision of AV fistula. His post-op course was complicated by hypotension and respiratory failure requiring intubation.  He was also noted to have new onset atrial fibrillation and Cardiology was asked to consult. Echocardiogram on 03/13/2021 showed EF 35%, global hypokinesis, moderately reduced RV systolic function, severely elevated pulmonary artery systolic pressure, moderate MR, moderate TR. He was unable to tolerate GDMT due to hypotension and ESRD. Patient was noted to have a history of GI bleed, but he was cautiously started on eliquis 2.5 mg BID.   Patient was last seen by  cardiology on 03/23/2021. At that time, patient reported that he was doing well and was compliant with eliquis.   Patient presented to the ED on 8/28 complaining of shortness of breath that had been going on for 1 month. Labs in the ED showed Na 137, K 4.4, creatinine 9.38, WBC 4.1, hemoglobin 11.6, platelets 66. BNP elevated to 2911, hsTn 72>>69>>80>>62.   EKG showed atrial fibrillation, HR 89 BPM, nonspecific IVCD, pvc present.   CXR showed stable cardiomegaly, scattered scarring, no evidence of acute cardiopulmonary disease. CT angio chest showed a right lower lobe segmental branch cloth. 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   While in the ED, patient was noted to have a 7 beat run of NSVT.   On interview, patient denies having any recent chest pain, palpitations, dizziness, syncope/near syncope. He has had SOB for the past month, is somewhat improved with HD but has not completely gone away. He has not taken eliquis for the past month.   Past Medical History:  Diagnosis Date   Anemia    Anxiety    Asthma    CHF (congestive heart failure) ([place])    Complication from renal dialysis device 63/14/9702   Complication of anesthesia 2017   according to pt and spouse pt was moving around and cough while under and pt had difficulty waking up so the anesthesia had to be reversed. and pt admitted to ICU.    COPD (chronic obstructive pulmonary disease) (HCC)    Diabetes mellitus without complication (HCC)    Type II - Patient states he  does not have diabetes, "they said sometimes when you start dialysis you don't have diabetes any longer"    ESOPHAGEAL VARICES 10/04/2008   Qualifier: Diagnosis of  By: Fuller Plan MD Marijo Conception    ESRD    on HD, T-TH-Sat - [place]   Hemiparesis due to old stroke [facility])    left   Hypertension    Hx, not current problems, no meds   LV dysfunction    EF 25-30% by echo 07/2011   Memory loss due to medical  condition    due to stroke   MR (mitral regurgitation)    moderate to severe, echo 07/2011   Peripheral vascular disease ([place])    Shortness of breath    with exertion   Stroke ([place])    TIA's-left sided weakness   Tobacco abuse     Past Surgical History:  Procedure Laterality Date   ABDOMINAL AORTOGRAM W/LOWER EXTREMITY Bilateral 07/21/2018   Procedure: ABDOMINAL AORTOGRAM W/LOWER EXTREMITY;  Surgeon: Waynetta Sandy, MD;  Location: [place] [facility];  Service: Cardiovascular;  Laterality: Bilateral;   AMPUTATION Left 11/05/2018   Procedure: AMPUTATION SECOND TOE LEFT FOOT;  Surgeon: Waynetta Sandy, MD;  Location: [place];  Service: Vascular;  Laterality: Left;   AMPUTATION Left 02/13/2021   Procedure: Left small finger AMPUTATION DIGIT;  Surgeon: Sherilyn Cooter, MD;  Location: [place];  Service: Orthopedics;  Laterality: Left;   AV FISTULA PLACEMENT     CARDIAC CATHETERIZATION     [facility]   COLONOSCOPY W/ BIOPSIES AND POLYPECTOMY     FISTULA SUPERFICIALIZATION Left 11/10/2013   Procedure: FISTULA PLICATION;  Surgeon: Serafina Mitchell, MD;  Location: [place];  Service: Vascular;  Laterality: Left;   INSERTION OF DIALYSIS CATHETER N/A 06/20/2021   Procedure: INSERTION OF DIALYSIS CATHETER;  Surgeon: Broadus John, MD;  Location: [facility] OR;  Service: Vascular;  Laterality: N/A;   KIDNEY TRANSPLANT     2011 rejected kidney 2012 back on dialysis   LEFT AND RIGHT HEART CATHETERIZATION WITH CORONARY ANGIOGRAM N/A 10/09/2013   Procedure: LEFT AND RIGHT HEART CATHETERIZATION WITH CORONARY ANGIOGRAM;  Surgeon: Burnell Blanks, MD;  Location: [facility] CATH LAB;  Service: Cardiovascular;  Laterality: N/A;   LIGATION OF ARTERIOVENOUS  FISTULA Left 06/20/2021   Procedure: LIGATION OF ARTERIOVENOUS  FISTULA;  Surgeon: Broadus John, MD;  Location: [place];  Service: Vascular;  Laterality: Left;   PERIPHERAL VASCULAR INTERVENTION Left 07/21/2018   Procedure: PERIPHERAL VASCULAR  INTERVENTION;  Surgeon: Waynetta Sandy, MD;  Location: [place] [facility];  Service: Cardiovascular;  Laterality: Left;  Popliteal   REVISON OF ARTERIOVENOUS FISTULA Left 08/26/2013   Procedure: EXCISION OF ERODED SKIN AND EXPLORATION OF MAIN LEFT UPPER ARM AV FISTULA;  Surgeon: Rosetta Posner, MD;  Location: [place];  Service: Vascular;  Laterality: Left;   REVISON OF ARTERIOVENOUS FISTULA Left 02/05/2014   Procedure: REPAIR OF ARTERIOVENOUS FISTULA ANEURYSM;  Surgeon: Serafina Mitchell, MD;  Location: [facility] OR;  Service: Vascular;  Laterality: Left;   REVISON OF ARTERIOVENOUS FISTULA Left 03/10/2021   Procedure: LEFT ARM REVISION OF ARTERIOVENOUS FISTULA WITH PLICATION;  Surgeon: Waynetta Sandy, MD;  Location: [place];  Service: Vascular;  Laterality: Left;   SHUNTOGRAM N/A 11/05/2012   Procedure: Fistulogram;  Surgeon: Serafina Mitchell, MD;  Location: [facility] C[facility] LAB;  Service: Cardiovascular;  Laterality: N/A;   TEE WITHOUT CARDIOVERSION  08/17/2011   Procedure: TRANSESOPHAGEAL ECHOCARDIOGRAM (TEE);  Surgeon: Elby Showers  Aundra Dubin, MD;  Location: [place];  Service: Cardiovascular;  Laterality: N/A;   ULTRASOUND GUIDANCE FOR VASCULAR ACCESS  06/20/2021   Procedure: ULTRASOUND GUIDANCE FOR VASCULAR ACCESS;  Surgeon: Broadus John, MD;  Location: [place];  Service: Vascular;;   VIDEO ASSISTED THORACOSCOPY (VATS)/DECORTICATION  08/10/11     Home Medications:  Prior to Admission medications   Medication Sig Start Date End Date Taking? Authorizing Provider  acetaminophen (TYLENOL) 500 MG tablet Take 1 tablet (500 mg total) by mouth every 6 (six) hours as needed. Patient not taking: Reported on 08/09/2021 04/11/18   Zigmund Gottron, NP  albuterol [facility] HFA) 108 (90 Base) MCG/ACT inhaler Inhale 2 puffs into the lungs every 6 (six) hours as needed for wheezing or shortness of breath. 08/25/21   Charlott Rakes, MD  albuterol (PROVENTIL) (2.5 MG/3ML) 0.083% nebulizer solution Take 3 mLs (2.5 mg total) by  nebulization every 6 (six) hours as needed for wheezing or shortness of breath. 08/25/21   Charlott Rakes, MD  apixaban (ELIQUIS) 2.5 MG TABS tablet Take 1 tablet (2.5 mg total) by mouth 2 (two) times daily. Hold your eliquis if your platelets are over 50.  You should follow up with cardiology and hematology about your blood thinners. 06/25/21   Elodia Florence., MD  B Complex-C-Folic Acid (DIALYVITE 546) 0.8 MG TABS Take 1 tablet by mouth daily. Patient not taking: Reported on 08/09/2021 04/14/21   [provider]  docusate sodium (COLACE) 100 MG capsule Take 1 capsule (100 mg total) by mouth 2 (two) times daily. Patient not taking: Reported on 08/09/2021 03/15/21   Raiford Noble Latif, DO  fluticasone-salmeterol (ADVAIR DISKUS) 250-50 MCG/ACT AEPB Inhale 1 puff into the lungs in the morning and at bedtime. NEEDS PASS 08/09/21   Gildardo Pounds, NP  glucose blood test strip  12/26/09   [provider]  hydrOXYzine (ATARAX) 10 MG tablet Take 1 tablet (10 mg total) by mouth 3 (three) times daily as needed. For anxiety Patient taking differently: Take 10 mg by mouth 3 (three) times daily as needed for anxiety. 06/07/21   Gildardo Pounds, NP  midodrine (PROAMATINE) 10 MG tablet Place 1 tablet (10 mg total) into feeding tube 3 (three) times daily with meals. Patient taking differently: Take 10 mg by mouth 3 (three) times daily with meals. 03/15/21   Sheikh, Omair Latif, DO  montelukast (SINGULAIR) 10 MG tablet Take 1 tablet (10 mg total) by mouth at bedtime. 06/07/21   Gildardo Pounds, NP  pantoprazole (PROTONIX) 40 MG tablet Take 1 tablet (40 mg total) by mouth daily. Patient not taking: Reported on 08/09/2021 06/23/21 06/23/22  Elodia Florence., MD  VELPHORO 500 MG chewable tablet Chew 1,000 mg by mouth 2 (two) times daily after a meal. 02/06/21   [provider]    Inpatient Medications: Scheduled Meds:  Chlorhexidine Gluconate Cloth  6 each Topical Daily   Chlorhexidine  Gluconate Cloth  6 each Topical Q0600   insulin aspart  0-6 Units Subcutaneous TID WC   ipratropium-albuterol  3 mL Nebulization Q6H   midodrine  5 mg Oral TID WC   Continuous Infusions:  heparin 1,400 Units/hr (09/05/21 0148)   PRN Meds: acetaminophen **OR** acetaminophen, guaiFENesin, hydrALAZINE, ipratropium-albuterol, metoprolol tartrate, perflutren lipid microspheres (DEFINITY) IV suspension, senna-docusate, traZODone  Allergies:    Allergies  Allergen Reactions   Codeine Shortness Of Breath   Dextromethorphan-Guaifenesin Shortness Of Breath   Iron Dextran Shortness Of Breath   Morphine And  Related Shortness Of Breath    Social History:   Social History   Socioeconomic History   Marital status: Single    Spouse name: Not on file   Number of children: 1   Years of education: Not on file   Highest education level: Not on file  Occupational History   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  Tobacco Use   Smoking status: Former    Packs/day: 0.50    Years: 15.00    Total pack years: 7.50    Types: Cigarettes    Quit date: 2014    Years since quitting: 9.6   Smokeless tobacco: Never  Vaping Use   Vaping Use: Never used  Substance and Sexual Activity   Alcohol use: No    Alcohol/week: 0.0 standard drinks of alcohol   Drug use: No   Sexual activity: Not Currently  Other Topics Concern   Not on file  Social History Narrative   Not on file   Social Determinants of Health   Financial Resource Strain: Not on file  Food Insecurity: Not on file  Transportation Needs: Not on file  Physical Activity: Not on file  Stress: Not on file  Social Connections: Not on file  Intimate Partner Violence: Not on file    Family History:    Family History  Problem Relation Age of Onset   Hypertension Mother    Varicose Veins Mother    Diabetes Paternal Grandmother    Cancer Paternal Grandfather    CAD Paternal Uncle      ROS:  Please see the  history of present illness.   All other ROS reviewed and negative.     Physical Exam/Data:   Vitals:   09/05/21 0024 09/05/21 0421 09/05/21 0720 09/05/21 0743  BP: 104/78 98/70 (!) 127/98   Pulse: 70 74 75 76  Resp: (!) 21 (!) '24 18 19  '$ Temp: 97.8 F (36.6 C) 97.6 F (36.4 C) 97.8 F (36.6 C)   TempSrc: Oral Oral Oral   SpO2: 95% 97% 96% 95%  Weight: 75.2 kg     Height:        Intake/Output Summary (Last 24 hours) at 09/05/2021 1036 Last data filed at 09/05/2021 0959 Gross per 24 hour  Intake 120 ml  Output --  Net 120 ml      09/05/2021   12:24 AM 09/04/2021    8:50 PM 09/04/2021   12:38 AM  Last 3 Weights  Weight (lbs) 165 lb 12.6 oz 165 lb 12.6 oz 175 lb  Weight (kg) 75.2 kg 75.2 kg 79.379 kg     Body mass index is 24.48 kg/m.  General:  Patient resting comfortably in the bed, in no acute distress.  HEENT: normal Neck: no JVD Vascular: Radial pulses 2+ bilaterally Cardiac:  Irregular rate and rhythm, grade 2/6 systolic murmur over apex and at left sternal boarder  Lungs:  crackles throughout, most noticeable in lower lung fields  Abd: soft, nontender Ext: Trace pitting edema in BLE  Musculoskeletal:  No deformities, BUE and BLE strength normal and equal Skin: warm and dry  Neuro:  CNs 2-12 intact, no focal abnormalities noted Psych:  Normal affect   EKG:  The EKG was personally reviewed and demonstrates:  atrial fibrillation, HR 89 BPM, nonspecific IVCD, PVC present  Telemetry:  Telemetry was personally reviewed and demonstrates:  Atiral fibrillation, HR in the 70s, PVCs present with occasional 3-beat runs of NSVT   Relevant CV Studies:  Echocardiogram 09/05/2021  1. Left ventricular ejection fraction, by estimation, is 30 to 35%. The  left ventricle has moderately decreased function. The left ventricle  demonstrates global hypokinesis. There is severe concentric left  ventricular hypertrophy. Left ventricular  diastolic parameters are indeterminate.   2.  Right ventricular systolic function is moderately reduced. The right  ventricular size is severely enlarged. There is severely elevated  pulmonary artery systolic pressure. The estimated right ventricular  systolic pressure is 76.7 mmHg.   3. Left atrial size was severely dilated.   4. Right atrial size was mildly dilated.   5. Regurgitant volume 42 ml, No right sided pulmonary vein systolic  blunting. There is a mixed etiology of mitral regurgitation: posterior  restriction and functional MR. Calcified mitral valve with at least mild  mitral regurgitation. The mitral valve is   degenerative. Moderate to severe mitral valve regurgitation. Mild mitral  stenosis. The mean mitral valve gradient is 5.0 mmHg with average heart  rate of 79 bpm. Moderate mitral annular calcification.   6. Tricuspid valve regurgitation is moderate to severe.   7. The aortic valve is tricuspid. There is mild calcification of the  aortic valve. There is mild thickening of the aortic valve. Aortic valve  regurgitation is not visualized. Aortic valve sclerosis is present, with  no evidence of aortic valve stenosis.   Comparison(s): Prior images reviewed side by side. Increase in mitral  valve and tricuspid valve regurgitation from prior. When patient is at dry  weight, consideration of TEE for characterization of MR/MS is reasonable  (inpatient vs outpatient).   Laboratory Data:  High Sensitivity Troponin:   Recent Labs  Lab 09/04/21 0100 09/04/21 0454 09/04/21 2216 09/05/21 0116  TROPONINIHS 72* 69* 80* 62*     Chemistry Recent Labs  Lab 09/04/21 0100 09/05/21 0825  NA 137 135  K 4.4 4.8  CL 96* 97*  CO2 26 23  GLUCOSE 96 92  BUN 45* 54*  CREATININE 9.38* 11.54*  CALCIUM 9.8 9.6  GFRNONAA 6* 5*  ANIONGAP 15 15    Recent Labs  Lab 09/04/21 0100 09/05/21 0825  PROT 8.0 7.7  ALBUMIN 3.2* 2.9*  AST 19 18  ALT 9 9  ALKPHOS 74 66  BILITOT 1.0 0.9   Lipids No results for input(s): "CHOL",  "TRIG", "HDL", "LABVLDL", "LDLCALC", "CHOLHDL" in the last 168 hours.  Hematology Recent Labs  Lab 09/04/21 0100 09/05/21 0825  WBC 4.1 4.1  RBC 4.14* 4.07*  HGB 11.6* 11.4*  HCT 38.0* 37.2*  MCV 91.8 91.4  MCH 28.0 28.0  MCHC 30.5 30.6  RDW 18.3* 18.0*  PLT 66* 70*   Thyroid No results for input(s): "TSH", "FREET4" in the last 168 hours.  BNP Recent Labs  Lab 09/04/21 0100  BNP 2,911.4*    DDimer No results for input(s): "DDIMER" in the last 168 hours.   Radiology/Studies:  ECHOCARDIOGRAM COMPLETE  Result Date: 09/05/2021    ECHOCARDIOGRAM REPORT   Patient Name:   YOVANY CLOCK Date of Exam: 09/05/2021 Medical Rec #:  [id number]       Height:       69.0 in Accession #:    [id number]      Weight:       165.8 lb Date of Birth:  03-Mar-1960       BSA:          1.908 m Patient Age:    17 years        BP:  127/98 mmHg Patient Gender: M               HR:           68 bpm. Exam Location:  Inpatient Procedure: 2D Echo, Cardiac Doppler, Color Doppler and Intracardiac            Opacification Agent                                 MODIFIED REPORT:     This report was modified by Rudean Haskell MD on 09/05/2021 due to                        Clarification of recommendations.  Indications:     Pulmonary embolus  History:         Patient has prior history of Echocardiogram examinations, most                  recent 03/13/2021. CHF, COPD, Arrythmias:Atrial Fibrillation,                  Signs/Symptoms:Shortness of Breath; Risk Factors:Current                  Smoker, Hypertension and Diabetes. Hx stroke. ESRD.  Sonographer:     Clayton Lefort RDCS (AE) Referring Phys:  [id number] Doreatha Lew [facility] Diagnosing Phys: Rudean Haskell MD IMPRESSIONS  1. Left ventricular ejection fraction, by estimation, is 30 to 35%. The left ventricle has moderately decreased function. The left ventricle demonstrates global hypokinesis. There is severe concentric left ventricular hypertrophy. Left ventricular  diastolic parameters are indeterminate.  2. Right ventricular systolic function is moderately reduced. The right ventricular size is severely enlarged. There is severely elevated pulmonary artery systolic pressure. The estimated right ventricular systolic pressure is 41.7 mmHg.  3. Left atrial size was severely dilated.  4. Right atrial size was mildly dilated.  5. Regurgitant volume 42 ml, No right sided pulmonary vein systolic blunting. There is a mixed etiology of mitral regurgitation: posterior restriction and functional MR. Calcified mitral valve with at least mild mitral regurgitation. The mitral valve is  degenerative. Moderate to severe mitral valve regurgitation. Mild mitral stenosis. The mean mitral valve gradient is 5.0 mmHg with average heart rate of 79 bpm. Moderate mitral annular calcification.  6. Tricuspid valve regurgitation is moderate to severe.  7. The aortic valve is tricuspid. There is mild calcification of the aortic valve. There is mild thickening of the aortic valve. Aortic valve regurgitation is not visualized. Aortic valve sclerosis is present, with no evidence of aortic valve stenosis. Comparison(s): Prior images reviewed side by side. Increase in mitral valve and tricuspid valve regurgitation from prior. When patient is at dry weight, consideration of TEE for characterization of MR/MS is reasonable (inpatient vs outpatient). FINDINGS  Left Ventricle: Left ventricular ejection fraction, by estimation, is 30 to 35%. The left ventricle has moderately decreased function. The left ventricle demonstrates global hypokinesis. Definity contrast agent was given IV to delineate the left ventricular endocardial borders. The left ventricular internal cavity size was normal in size. There is severe concentric left ventricular hypertrophy. Left ventricular diastolic parameters are indeterminate. Right Ventricle: The right ventricular size is severely enlarged. No increase in right ventricular wall  thickness. Right ventricular systolic function is moderately reduced. There is severely elevated pulmonary artery systolic pressure. The tricuspid regurgitant velocity is 3.69 m/s, and with an assumed  right atrial pressure of 8 mmHg, the estimated right ventricular systolic pressure is 50.9 mmHg. Left Atrium: Left atrial size was severely dilated. Right Atrium: Right atrial size was mildly dilated. Pericardium: There is no evidence of pericardial effusion. Mitral Valve: Regurgitant volume 42 ml, No right sided pulmonary vein systolic blunting. There is a mixed etiology of mitral regurgitation: posterior restriction and functional MR. Calcified mitral valve with at least mild mitral regurgitation. The mitral valve is degenerative in appearance. Moderate mitral annular calcification. Moderate to severe mitral valve regurgitation. Mild mitral valve stenosis. The mean mitral valve gradient is 5.0 mmHg with average heart rate of 79 bpm. Tricuspid Valve: The tricuspid valve is normal in structure. Tricuspid valve regurgitation is moderate to severe. Aortic Valve: The aortic valve is tricuspid. There is mild calcification of the aortic valve. There is mild thickening of the aortic valve. There is moderate aortic valve annular calcification. Aortic valve regurgitation is not visualized. Aortic valve sclerosis is present, with no evidence of aortic valve stenosis. Aortic valve mean gradient measures 2.0 mmHg. Aortic valve peak gradient measures 4.5 mmHg. Aortic valve area, by VTI measures 1.47 cm. Pulmonic Valve: The pulmonic valve was normal in structure. Pulmonic valve regurgitation is trivial. No evidence of pulmonic stenosis. Aorta: The aortic root is normal in size and structure. IAS/Shunts: No atrial level shunt detected by color flow Doppler.  LEFT VENTRICLE PLAX 2D LVIDd:         5.50 cm LVIDs:         4.60 cm LV PW:         1.60 cm LV IVS:        1.50 cm LVOT diam:     2.10 cm LV SV:         28 LV SV Index:   15  LVOT Area:     3.46 cm  LV Volumes (MOD) LV vol d, MOD A2C: 188.0 ml LV vol d, MOD A4C: 186.0 ml LV vol s, MOD A2C: 117.0 ml LV vol s, MOD A4C: 127.0 ml LV SV MOD A2C:     71.0 ml LV SV MOD A4C:     186.0 ml LV SV MOD BP:      62.7 ml RIGHT VENTRICLE            IVC RV S prime:     5.08 cm/s  IVC diam: 2.00 cm TAPSE (M-mode): 0.7 cm LEFT ATRIUM            Index        RIGHT ATRIUM           Index LA diam:      4.60 cm  2.41 cm/m   RA Area:     22.80 cm LA Vol (A4C): 101.0 ml 52.94 ml/m  RA Volume:   66.00 ml  34.60 ml/m  AORTIC VALVE AV Area (Vmax):    1.65 cm AV Area (Vmean):   1.38 cm AV Area (VTI):     1.47 cm AV Vmax:           106.00 cm/s AV Vmean:          72.700 cm/s AV VTI:            0.191 m AV Peak Grad:      4.5 mmHg AV Mean Grad:      2.0 mmHg LVOT Vmax:         50.40 cm/s LVOT Vmean:        29.000 cm/s LVOT  VTI:          0.081 m LVOT/AV VTI ratio: 0.43  AORTA Ao Root diam: 3.20 cm Ao Asc diam:  3.30 cm MITRAL VALVE                  TRICUSPID VALVE MV Mean grad: 5.0 mmHg        TR Peak grad:   54.5 mmHg MR Peak grad:    79.6 mmHg    TR Vmax:        369.00 cm/s MR Mean grad:    42.0 mmHg MR Vmax:         446.00 cm/s  SHUNTS MR Vmean:        290.0 cm/s   Systemic VTI:  0.08 m MR PISA:         4.02 cm     Systemic Diam: 2.10 cm MR PISA Eff ROA: 35 mm MR PISA Radius:  0.80 cm Rudean Haskell MD Electronically signed by Rudean Haskell MD Signature Date/Time: 09/05/2021/9:40:38 AM    Final (Updated)    CT Angio Chest PE W and/or Wo Contrast  Result Date: 09/04/2021 CLINICAL DATA:  Shortness of breath EXAM: CT ANGIOGRAPHY CHEST WITH CONTRAST TECHNIQUE: Multidetector CT imaging of the chest was performed using the standard protocol during bolus administration of intravenous contrast. Multiplanar CT image reconstructions and MIPs were obtained to evaluate the vascular anatomy. RADIATION DOSE REDUCTION: This exam was performed according to the departmental dose-optimization program which  includes automated exposure control, adjustment of the mA and/or kV according to patient size and/or use of iterative reconstruction technique. CONTRAST:  44m OMNIPAQUE IOHEXOL 350 MG/ML SOLN COMPARISON:  Noncontrast chest CT 06/21/2021 FINDINGS: Cardiovascular: Cardiomegaly. Preferential dilatation of the right heart with 1.74 RV to LV ratio and hepatic venous reflux, likely chronic when compared to prior and when considering the pulmonary arteries. There is non enhancement asymmetrically involving right lower lobe segmental arteries. Some mixing artifact is noted within the pulmonary arteries, worse in the right lung. Pulmonary arteries show chronic diffuse wall calcification which is linear. Atheromatous calcification. Perma catheter on the left in good position. Mediastinum/Nodes: Negative for adenopathy or mass. Lungs/Pleura: Pleural thickening with calcification asymmetrically involving the right hemithorax. There are bands of opacity in the bilateral lungs from atelectasis/scarring, similar to prior. Improvement in left lower lobe aeration since prior. Upper Abdomen: No acute finding. Partially covered polycystic kidneys. Musculoskeletal: Generalized osteopenia. Review of the MIP images confirms the above findings. Critical Value/emergent results were called by telephone at the time of interpretation on 09/04/2021 at 4:45 am [facility], who verbally acknowledged these results. IMPRESSION: 1. Positive for right lower lobe segmental branch clot. There is a background of pulmonary hypertension with dilated right heart and pulmonary artery calcification. 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 Chronic lung disease; improved left lower lobe aeration since June 2023. 3.  Aortic Atherosclerosis (ICD10-I70.0). Electronically Signed   By: JJorje GuildM.D.   On: 09/04/2021 04:47   DG Chest Portable 1 View  Result Date: 09/04/2021 CLINICAL DATA:  Shortness of breath EXAM: PORTABLE CHEST 1 VIEW COMPARISON:   07/10/2021 FINDINGS: Scarring in the right mid/lower lung and lingula. Right apical pleural-parenchymal scarring. No focal consolidation. Chronic blunting of the right costophrenic angle. No pleural effusion or pneumothorax. Stable cardiomegaly. Left chest dual lumen dialysis catheter terminating at the cavoatrial junction. IMPRESSION: Stable cardiomegaly. Scattered scarring. No evidence of acute cardiopulmonary disease. Electronically Signed   By: SHenderson NewcomerD.  On: 09/04/2021 01:51     Assessment and Plan:   Brief Run of NSVT  - In the ED, patient was noted to have a 7 beat run of NSVT. Now, telemetry shows PVCs with infrequent 3-beat runs of NSVT  - Patient is asymptomatic (denies chest pain, palpitations, syncope/near syncope)  - Patient is on midodrine for BP support, unable to tolerate BB at this time - Maintain K>4, mag>2  - Continue to monitor on telemetry  - Echo this admission showed EF 35%, unlikely patient will require ICD. Could consider monitor at DC to assess PVC burden, although I do not think it will alter our treatment plan   Paroxysmal Atrial Fibrillation  - Patient currently on IV heparin for treatment of acute PE.  - Patient previously on eliquis 2.5 mg BID, however he reportedly has not been taking for the past month. At discharge, he will need to be on a higher dose of eliquis  - Not on any rate controlling medications, telemetry shows HR in the 70s   Chronic HFrEF  Nonischemic Cardiomyopathy  Pulmonary HTN  - Patient has a known history of nonischemic cardiomyopathy. EF was 30% in 2015, later improved to 40-45%. - Most recent echo on 03/13/2021 showed EF was reduced to 35%.  - Echo this admission showed EF 30-35%, severe LVH, moderately reduced RV systolic function, severely elevated pulmonary artery sytolic pressure.  - Patient unable to tolerate GDMT due to hypotension and ESRD  - Volume to be managed by HD-- set to undergo HD today. On midodrine for BP  support    Moderate to Severe Mitral Valve Regurgitation  Moderate to Severe Tricuspid Valve Regurgitation  - Echo this admission showed moderate-severe mitral valve regurgitation with mixed etiology: posterior restriction and function MR. Also showed moderate-severe tricuspid valve regurgitation  - Consider TEE for further evaluation of valvular disease. Can be completed as an outpatient, should be timed with HD so patient is closer to euvolemic   Otherwise per primary  - Acute PE, likely unprovoked  - ESRD on HD Tuesday, Thursday, Saturday  - Type 2 DM  - Chronic anemia and thrombocytopenia    Risk Assessment/Risk Scores:    New York Heart Association (NYHA) Functional Class NYHA Class III  CHA2DS2-VASc Score = 6   This indicates a 9.7% annual risk of stroke. The patient's score is based upon: CHF History: 1 HTN History: 1 Diabetes History: 1 Stroke History: 2 Vascular Disease History: 1 Age Score: 0 Gender Score: 0       For questions or updates, please contact [facility] Please consult [email or web address] for contact info under    Signed, Margie Billet, PA-C  09/05/2021 10:36 AM  Attending Note:   The patient was seen and examined.  Agree with assessment and plan as noted above.  Changes made to the above note as needed.  Patient seen and independently examined with Vikki Ports, PA .   We discussed all aspects of the encounter. I agree with the assessment and plan as stated above.    Nonsustained VT:  has had multiple episodes of 3 beat runs of NSVT.  Had a 7 beat run .  I suspect these are due to his recent pulmonary edema in the setting of systolic CHF and chronic pulmonary HTN.   Has hypotension at baseline ( on midodrine) so I dont think he will tolerate scheduled beta blocker.  Ok to give PRN IV metoprolol for any sustained episodes of NSVT .  These episodes of nonsustained VT should decline over the next several days   2.  ESRD :  cont  dialysis  3.  Pulmonary embolus :   he will need to be discharged on the proper dose of eliquis for treatment of PE.  He was on low dose eliquis for his atrial ( low dose secondary to a hx of GI bleed in the past )   4.  Post op complications with dialysis fistula surgery :  post op course was complicated by respiratory failure.  This was likely due to his severe pulmonary HTN   I have spent a total of 40 minutes with patient reviewing hospital  notes , telemetry, EKGs, labs and examining patient as well as establishing an assessment and plan that was discussed with the patient.  > 50% of time was spent in direct patient care.    Thayer Headings, Brooke Bonito., MD, [facility] 09/05/2021, 1:11 PM [address],  [place] Pager [phone]

## 2021-09-05 NOTE — Consult Note (Signed)
[facility] Renal Consultation Note    Indication for Consultation:  Management of ESRD/hemodialysis, anemia, hypertension/volume, and secondary hyperparathyroidism.  HPI: Nathaniel White is a 61 y.o. male with PMH including ESRD on dialysis, COPD, CHF, a.fib on eliquis ,TIAs, and recent admission in 06/2021 with necrotizing pneumonia, who presented to the ED on 09/04/21 with shortness of breath. Pt reports he had been short of breath for about 1 month. He reportedly had not been taking his eliquis for about 1 month. He denied any chest pain, chills, or fever. CT angiogram showed pulmonary embolism, no edema. He was started on heparin and admitted [facility]. Nephrology has been consulted for hemodialysis.  Patient attends HD on TTS schedule. Last HD was Saturday, left 0.3kg below his EDW. He does take midodrine for hypotension prior to HD. This AM, patient is sleepy. He reports SOB is improved. Denies CP, palpitations, dizziness, abdominal pain, nausea, fever and chills. Falls asleep during assessment.   Past Medical History:  Diagnosis Date   Anemia    Anxiety    Asthma    CHF (congestive heart failure) ([place])    Complication from renal dialysis device 63/01/6008   Complication of anesthesia 2017   according to pt and spouse pt was moving around and cough while under and pt had difficulty waking up so the anesthesia had to be reversed. and pt admitted to ICU.    COPD (chronic obstructive pulmonary disease) (H[facility])    Diabetes mellitus without complication ([place])    Type II - Patient states he does not have diabetes, "they said sometimes when you start dialysis you don't have diabetes any longer"    ESOPHAGEAL VARICES 10/04/2008   Qualifier: Diagnosis of  By: Fuller Plan MD Lamont Snowball T    ESRD    on HD, T-TH-Sat - [place]   Hemiparesis due to old stroke [facility])    left   Hypertension    Hx, not current problems, no meds   LV dysfunction    EF 25-30% by echo 07/2011   Memory loss  due to medical condition    due to stroke   MR (mitral regurgitation)    moderate to severe, echo 07/2011   Peripheral vascular disease ([place])    Shortness of breath    with exertion   Stroke ([place])    TIA's-left sided weakness   Tobacco abuse    Past Surgical History:  Procedure Laterality Date   ABDOMINAL AORTOGRAM W/LOWER EXTREMITY Bilateral 07/21/2018   Procedure: ABDOMINAL AORTOGRAM W/LOWER EXTREMITY;  Surgeon: Waynetta Sandy, MD;  Location: [place] [facility];  Service: Cardiovascular;  Laterality: Bilateral;   AMPUTATION Left 11/05/2018   Procedure: AMPUTATION SECOND TOE LEFT FOOT;  Surgeon: Waynetta Sandy, MD;  Location: [place];  Service: Vascular;  Laterality: Left;   AMPUTATION Left 02/13/2021   Procedure: Left small finger AMPUTATION DIGIT;  Surgeon: Sherilyn Cooter, MD;  Location: [place];  Service: Orthopedics;  Laterality: Left;   AV FISTULA PLACEMENT     CARDIAC CATHETERIZATION     [place] [facility]   COLONOSCOPY W/ BIOPSIES AND POLYPECTOMY     FISTULA SUPERFICIALIZATION Left 11/10/2013   Procedure: FISTULA PLICATION;  Surgeon: Serafina Mitchell, MD;  Location: [place];  Service: Vascular;  Laterality: Left;   INSERTION OF DIALYSIS CATHETER N/A 06/20/2021   Procedure: INSERTION OF DIALYSIS CATHETER;  Surgeon: Broadus John, MD;  Location: [place];  Service: Vascular;  Laterality: N/A;   KIDNEY TRANSPLANT  2011 rejected kidney 2012 back on dialysis   LEFT AND RIGHT HEART CATHETERIZATION WITH CORONARY ANGIOGRAM N/A 10/09/2013   Procedure: LEFT AND RIGHT HEART CATHETERIZATION WITH CORONARY ANGIOGRAM;  Surgeon: Burnell Blanks, MD;  Location: [facility] CATH LAB;  Service: Cardiovascular;  Laterality: N/A;   LIGATION OF ARTERIOVENOUS  FISTULA Left 06/20/2021   Procedure: LIGATION OF ARTERIOVENOUS  FISTULA;  Surgeon: Broadus John, MD;  Location: [place];  Service: Vascular;  Laterality: Left;   PERIPHERAL VASCULAR INTERVENTION Left 07/21/2018   Procedure:  PERIPHERAL VASCULAR INTERVENTION;  Surgeon: Waynetta Sandy, MD;  Location: [place] [facility];  Service: Cardiovascular;  Laterality: Left;  Popliteal   REVISON OF ARTERIOVENOUS FISTULA Left 08/26/2013   Procedure: EXCISION OF ERODED SKIN AND EXPLORATION OF MAIN LEFT UPPER ARM AV FISTULA;  Surgeon: Rosetta Posner, MD;  Location: [place];  Service: Vascular;  Laterality: Left;   REVISON OF ARTERIOVENOUS FISTULA Left 02/05/2014   Procedure: REPAIR OF ARTERIOVENOUS FISTULA ANEURYSM;  Surgeon: Serafina Mitchell, MD;  Location: [facility] OR;  Service: Vascular;  Laterality: Left;   REVISON OF ARTERIOVENOUS FISTULA Left 03/10/2021   Procedure: LEFT ARM REVISION OF ARTERIOVENOUS FISTULA WITH PLICATION;  Surgeon: Waynetta Sandy, MD;  Location: [place];  Service: Vascular;  Laterality: Left;   SHUNTOGRAM N/A 11/05/2012   Procedure: Fistulogram;  Surgeon: Serafina Mitchell, MD;  Location: [facility] CATH LAB;  Service: Cardiovascular;  Laterality: N/A;   TEE WITHOUT CARDIOVERSION  08/17/2011   Procedure: TRANSESOPHAGEAL ECHOCARDIOGRAM (TEE);  Surgeon: Larey Dresser, MD;  Location: [place];  Service: Cardiovascular;  Laterality: N/A;   ULTRASOUND GUIDANCE FOR VASCULAR ACCESS  06/20/2021   Procedure: ULTRASOUND GUIDANCE FOR VASCULAR ACCESS;  Surgeon: Broadus John, MD;  Location: [facility] OR;  Service: Vascular;;   VIDEO ASSISTED THORACOSCOPY (VATS)/DECORTICATION  08/10/11   Family History  Problem Relation Age of Onset   Hypertension Mother    Varicose Veins Mother    Diabetes Paternal Grandmother    Cancer Paternal Grandfather    CAD Paternal Uncle    Social History:  reports that he quit smoking about 9 years ago. His smoking use included cigarettes. He has a 7.50 pack-year smoking history. He has never used smokeless tobacco. He reports that he does not drink alcohol and does not use drugs.  ROS: As per HPI otherwise negative.  Physical Exam: Vitals:   09/05/21 0024 09/05/21 0421 09/05/21 0720 09/05/21  0743  BP: 104/78 98/70 (!) 127/98   Pulse: 70 74 75 76  Resp: (!) 21 (!) '24 18 19  '$ Temp: 97.8 F (36.6 C) 97.6 F (36.4 C) 97.8 F (36.6 C)   TempSrc: Oral Oral Oral   SpO2: 95% 97% 96% 95%  Weight: 75.2 kg     Height:         General: Well developed male, sleeping but awakens to voice, NAD Head: Normocephalic, atraumatic, sclera non-icteric, mucus membranes are moist. Lungs: Clear bilaterally to auscultation without wheezes, rales, or rhonchi. Breathing is unlabored on O2 via [place] Heart: RRR with normal S1, S2. No murmurs, rubs, or gallops appreciated. Abdomen: Soft, non-distended with normoactive bowel sounds. No obvious abdominal masses. Musculoskeletal:  Strength and tone appear normal for age. Lower extremities: trace pitting edema b/l lower extremities Psych:  Responds to questions appropriately with a normal affect. Dialysis Access: [facility] with dry intact bandage  Allergies  Allergen Reactions   Codeine Shortness Of Breath   Dextromethorphan-Guaifenesin Shortness Of Breath   Iron Dextran Shortness  Of Breath   Morphine And Related Shortness Of Breath   Prior to Admission medications   Medication Sig Start Date End Date Taking? Authorizing Provider  acetaminophen (TYLENOL) 500 MG tablet Take 1 tablet (500 mg total) by mouth every 6 (six) hours as needed. Patient not taking: Reported on 08/09/2021 04/11/18   Zigmund Gottron, NP  albuterol [facility] HFA) 108 (90 Base) MCG/ACT inhaler Inhale 2 puffs into the lungs every 6 (six) hours as needed for wheezing or shortness of breath. 08/25/21   Charlott Rakes, MD  albuterol (PROVENTIL) (2.5 MG/3ML) 0.083% nebulizer solution Take 3 mLs (2.5 mg total) by nebulization every 6 (six) hours as needed for wheezing or shortness of breath. 08/25/21   Charlott Rakes, MD  apixaban (ELIQUIS) 2.5 MG TABS tablet Take 1 tablet (2.5 mg total) by mouth 2 (two) times daily. Hold your eliquis if your platelets are over 50.  You should follow up with cardiology  and hematology about your blood thinners. 06/25/21   Elodia Florence., MD  B Complex-C-Folic Acid (DIALYVITE 417) 0.8 MG TABS Take 1 tablet by mouth daily. Patient not taking: Reported on 08/09/2021 04/14/21   [provider]  docusate sodium (COLACE) 100 MG capsule Take 1 capsule (100 mg total) by mouth 2 (two) times daily. Patient not taking: Reported on 08/09/2021 03/15/21   Raiford Noble Latif, DO  fluticasone-salmeterol (ADVAIR DISKUS) 250-50 MCG/ACT AEPB Inhale 1 puff into the lungs in the morning and at bedtime. NEEDS PASS 08/09/21   Gildardo Pounds, NP  glucose blood test strip  12/26/09   [provider]  hydrOXYzine (ATARAX) 10 MG tablet Take 1 tablet (10 mg total) by mouth 3 (three) times daily as needed. For anxiety Patient taking differently: Take 10 mg by mouth 3 (three) times daily as needed for anxiety. 06/07/21   Gildardo Pounds, NP  midodrine (PROAMATINE) 10 MG tablet Place 1 tablet (10 mg total) into feeding tube 3 (three) times daily with meals. Patient taking differently: Take 10 mg by mouth 3 (three) times daily with meals. 03/15/21   Sheikh, Omair Latif, DO  montelukast (SINGULAIR) 10 MG tablet Take 1 tablet (10 mg total) by mouth at bedtime. 06/07/21   Gildardo Pounds, NP  pantoprazole (PROTONIX) 40 MG tablet Take 1 tablet (40 mg total) by mouth daily. Patient not taking: Reported on 08/09/2021 06/23/21 06/23/22  Elodia Florence., MD  VELPHORO 500 MG chewable tablet Chew 1,000 mg by mouth 2 (two) times daily after a meal. 02/06/21   [provider]   Current Facility-Administered Medications  Medication Dose Route Frequency Provider Last Rate Last Admin   acetaminophen (TYLENOL) tablet 650 mg  650 mg Oral Q6H PRN Rise Patience, MD       Or   acetaminophen (TYLENOL) suppository 650 mg  650 mg Rectal Q6H PRN Rise Patience, MD       Chlorhexidine Gluconate Cloth 2 % PADS 6 each  6 each Topical Daily Rise Patience, MD   6 each at  09/05/21 0917   guaiFENesin (ROBITUSSIN) 100 MG/5ML liquid 5 mL  5 mL Oral Q4H PRN Amin, Ankit Chirag, MD       heparin ADULT infusion 100 units/mL (25000 units/275m)  1,400 Units/hr Intravenous Continuous KRise Patience MD 14 mL/hr at 09/05/21 0148 1,400 Units/hr at 09/05/21 0148   hydrALAZINE (APRESOLINE) injection 10 mg  10 mg Intravenous Q4H PRN ADamita Lack MD  insulin aspart (novoLOG) injection 0-6 Units  0-6 Units Subcutaneous TID WC Rise Patience, MD       ipratropium-albuterol (DUONEB) 0.5-2.5 (3) MG/3ML nebulizer solution 3 mL  3 mL Nebulization Q4H PRN Amin, Ankit Chirag, MD       ipratropium-albuterol (DUONEB) 0.5-2.5 (3) MG/3ML nebulizer solution 3 mL  3 mL Nebulization Q6H Amin, Ankit Chirag, MD   3 mL at 09/05/21 0743   metoprolol tartrate (LOPRESSOR) injection 5 mg  5 mg Intravenous Q4H PRN Amin, Ankit Chirag, MD       midodrine (PROAMATINE) tablet 5 mg  5 mg Oral TID WC Rise Patience, MD   5 mg at 09/05/21 0913   perflutren lipid microspheres (DEFINITY) IV suspension  1-10 mL Intravenous PRN Rise Patience, MD   3 mL at 09/05/21 6195   senna-docusate (Senokot-S) tablet 1 tablet  1 tablet Oral QHS PRN Amin, Jeanella Flattery, MD       traZODone (DESYREL) tablet 50 mg  50 mg Oral QHS PRN Amin, Jeanella Flattery, MD       Labs: Basic Metabolic Panel: Recent Labs  Lab 09/04/21 0100  NA 137  K 4.4  CL 96*  CO2 26  GLUCOSE 96  BUN 45*  CREATININE 9.38*  CALCIUM 9.8   Liver Function Tests: Recent Labs  Lab 09/04/21 0100  AST 19  ALT 9  ALKPHOS 74  BILITOT 1.0  PROT 8.0  ALBUMIN 3.2*   No results for input(s): "LIPASE", "AMYLASE" in the last 168 hours. No results for input(s): "AMMONIA" in the last 168 hours. CBC: Recent Labs  Lab 09/04/21 0100 09/05/21 0825  WBC 4.1 4.1  NEUTROABS 1.9  --   HGB 11.6* 11.4*  HCT 38.0* 37.2*  MCV 91.8 91.4  PLT 66* 70*   Cardiac Enzymes: No results for input(s): "CKTOTAL", "CKMB", "CKMBINDEX",  "TROPONINI" in the last 168 hours. CBG: Recent Labs  Lab 09/05/21 0739  GLUCAP 98   Iron Studies: No results for input(s): "IRON", "TIBC", "TRANSFERRIN", "FERRITIN" in the last 72 hours. Studies/Results: CT Angio Chest PE W and/or Wo Contrast  Result Date: 09/04/2021 CLINICAL DATA:  Shortness of breath EXAM: CT ANGIOGRAPHY CHEST WITH CONTRAST TECHNIQUE: Multidetector CT imaging of the chest was performed using the standard protocol during bolus administration of intravenous contrast. Multiplanar CT image reconstructions and MIPs were obtained to evaluate the vascular anatomy. RADIATION DOSE REDUCTION: This exam was performed according to the departmental dose-optimization program which includes automated exposure control, adjustment of the mA and/or kV according to patient size and/or use of iterative reconstruction technique. CONTRAST:  18m OMNIPAQUE IOHEXOL 350 MG/ML SOLN COMPARISON:  Noncontrast chest CT 06/21/2021 FINDINGS: Cardiovascular: Cardiomegaly. Preferential dilatation of the right heart with 1.74 RV to LV ratio and hepatic venous reflux, likely chronic when compared to prior and when considering the pulmonary arteries. There is non enhancement asymmetrically involving right lower lobe segmental arteries. Some mixing artifact is noted within the pulmonary arteries, worse in the right lung. Pulmonary arteries show chronic diffuse wall calcification which is linear. Atheromatous calcification. Perma catheter on the left in good position. Mediastinum/Nodes: Negative for adenopathy or mass. Lungs/Pleura: Pleural thickening with calcification asymmetrically involving the right hemithorax. There are bands of opacity in the bilateral lungs from atelectasis/scarring, similar to prior. Improvement in left lower lobe aeration since prior. Upper Abdomen: No acute finding. Partially covered polycystic kidneys. Musculoskeletal: Generalized osteopenia. Review of the MIP images confirms the above findings.  Critical Value/emergent results were called by telephone  at the time of interpretation on 09/04/2021 at 4:45 am to provider [facility] , who verbally acknowledged these results. IMPRESSION: 1. Positive for right lower lobe segmental branch clot. There is a background of pulmonary hypertension with dilated right heart and pulmonary artery calcification. 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 Chronic lung disease; improved left lower lobe aeration since June 2023. 3.  Aortic Atherosclerosis (ICD10-I70.0). Electronically Signed   By: Jorje Guild M.D.   On: 09/04/2021 04:47   DG Chest Portable 1 View  Result Date: 09/04/2021 CLINICAL DATA:  Shortness of breath EXAM: PORTABLE CHEST 1 VIEW COMPARISON:  07/10/2021 FINDINGS: Scarring in the right mid/lower lung and lingula. Right apical pleural-parenchymal scarring. No focal consolidation. Chronic blunting of the right costophrenic angle. No pleural effusion or pneumothorax. Stable cardiomegaly. Left chest dual lumen dialysis catheter terminating at the cavoatrial junction. IMPRESSION: Stable cardiomegaly. Scattered scarring. No evidence of acute cardiopulmonary disease. Electronically Signed   By: Julian Hy M.D.   On: 09/04/2021 01:51    Outpatient Dialysis Orders:  TueThuSat, 4 hrs 0 min, 180NRe Optiflux, BFR 450, DFR Manual 500 mL/min, EDW 73.5 (kg), Dialysate 2.0 K, 2.0 Ca TDC No heparin Parsabiv 7.'5mg'$  IV q HD  Assessment/Plan:  Pulmonary embolism: On heparin drip, management per primary team  ESRD:  Dialysis on TTS schedule, will plan for dialysis today.   Hypertension/volume: BP controlled. Trace edema on exam. Does take midodrine three times daily, will order an extra dose with HD as needed. UF to EDW as tolerated.   Anemia: Hb 11.4. Not on ESA outpatient, none indicated at this time.   Metabolic bone disease: Last calcium elevated. Not on VDRA. Unable to give parsabiv here, avoid meds containing calcium. Will add phos to lab order, continue  binders.  Nutrition:  Continue renal diet, adding protein supplements A.fib: previously on eliquis but reportedly no longer taking. RRR today, rate controlled.   Anice Paganini, PA-C 09/05/2021, 9:28 AM  [facility] Pager: [phone]

## 2021-09-05 NOTE — Progress Notes (Signed)
Mobility Specialist: Progress Note   09/05/21 1421  Mobility  Activity Transferred from bed to chair  Level of Assistance Minimal assist, patient does 75% or more  Assistive Device Other (Comment) (HHA)  Distance Ambulated (ft) 2 ft  Activity Response Tolerated well  $Mobility charge 1 Mobility   Post-Mobility: 71 HR, 93% SpO2  Pt received in the bed and agreeable to mobility. Pt said that he primarily transfers to a wheelchair to mobilize at home. Agreeable to transfer to the chair. Transferred on 3 L/min Accident. Pt required minA to stand and contact guard during transfer. Pt has call bell and phone in reach. Chair alarm is on.  [address] [facility] Vanecia Limpert Mobility Specialist Mobility Specialist [address]: [phone]

## 2021-09-06 ENCOUNTER — Other Ambulatory Visit (HOSPITAL_COMMUNITY): Payer: Self-pay

## 2021-09-06 ENCOUNTER — Telehealth (HOSPITAL_COMMUNITY): Payer: Self-pay | Admitting: Pharmacy Technician

## 2021-09-06 DIAGNOSIS — I2609 Other pulmonary embolism with acute cor pulmonale: Secondary | ICD-10-CM

## 2021-09-06 DIAGNOSIS — I5020 Unspecified systolic (congestive) heart failure: Secondary | ICD-10-CM

## 2021-09-06 DIAGNOSIS — I2694 Multiple subsegmental pulmonary emboli without acute cor pulmonale: Secondary | ICD-10-CM

## 2021-09-06 DIAGNOSIS — Z992 Dependence on renal dialysis: Secondary | ICD-10-CM

## 2021-09-06 DIAGNOSIS — N186 End stage renal disease: Secondary | ICD-10-CM | POA: Diagnosis not present

## 2021-09-06 DIAGNOSIS — D696 Thrombocytopenia, unspecified: Secondary | ICD-10-CM | POA: Diagnosis not present

## 2021-09-06 DIAGNOSIS — R778 Other specified abnormalities of plasma proteins: Secondary | ICD-10-CM | POA: Diagnosis not present

## 2021-09-06 DIAGNOSIS — E1122 Type 2 diabetes mellitus with diabetic chronic kidney disease: Secondary | ICD-10-CM

## 2021-09-06 LAB — CBC WITH DIFFERENTIAL/PLATELET
Abs Immature Granulocytes: 0 10*3/uL (ref 0.00–0.07)
Basophils Absolute: 0.1 10*3/uL (ref 0.0–0.1)
Basophils Relative: 3 %
Eosinophils Absolute: 0.4 10*3/uL (ref 0.0–0.5)
Eosinophils Relative: 10 %
HCT: 34.7 % — ABNORMAL LOW (ref 39.0–52.0)
Hemoglobin: 10.8 g/dL — ABNORMAL LOW (ref 13.0–17.0)
Immature Granulocytes: 0 %
Lymphocytes Relative: 22 %
Lymphs Abs: 0.8 10*3/uL (ref 0.7–4.0)
MCH: 28.5 pg (ref 26.0–34.0)
MCHC: 31.1 g/dL (ref 30.0–36.0)
MCV: 91.6 fL (ref 80.0–100.0)
Monocytes Absolute: 0.4 10*3/uL (ref 0.1–1.0)
Monocytes Relative: 10 %
Neutro Abs: 2 10*3/uL (ref 1.7–7.7)
Neutrophils Relative %: 55 %
Platelets: 65 10*3/uL — ABNORMAL LOW (ref 150–400)
RBC: 3.79 MIL/uL — ABNORMAL LOW (ref 4.22–5.81)
RDW: 18 % — ABNORMAL HIGH (ref 11.5–15.5)
WBC: 3.7 10*3/uL — ABNORMAL LOW (ref 4.0–10.5)
nRBC: 0 % (ref 0.0–0.2)

## 2021-09-06 LAB — HEPATITIS C ANTIBODY: HCV Ab: NONREACTIVE

## 2021-09-06 LAB — MAGNESIUM: Magnesium: 1.9 mg/dL (ref 1.7–2.4)

## 2021-09-06 LAB — PHOSPHORUS: Phosphorus: 5.8 mg/dL — ABNORMAL HIGH (ref 2.5–4.6)

## 2021-09-06 LAB — COMPREHENSIVE METABOLIC PANEL
ALT: 8 U/L (ref 0–44)
AST: 15 U/L (ref 15–41)
Albumin: 2.7 g/dL — ABNORMAL LOW (ref 3.5–5.0)
Alkaline Phosphatase: 63 U/L (ref 38–126)
Anion gap: 14 (ref 5–15)
BUN: 34 mg/dL — ABNORMAL HIGH (ref 6–20)
CO2: 25 mmol/L (ref 22–32)
Calcium: 9.5 mg/dL (ref 8.9–10.3)
Chloride: 97 mmol/L — ABNORMAL LOW (ref 98–111)
Creatinine, Ser: 7.51 mg/dL — ABNORMAL HIGH (ref 0.61–1.24)
GFR, Estimated: 8 mL/min — ABNORMAL LOW (ref >60)
Glucose, Bld: 110 mg/dL — ABNORMAL HIGH (ref 70–99)
Potassium: 3.5 mmol/L (ref 3.5–5.1)
Sodium: 136 mmol/L (ref 135–145)
Total Bilirubin: 0.7 mg/dL (ref 0.3–1.2)
Total Protein: 7 g/dL (ref 6.5–8.1)

## 2021-09-06 LAB — APTT: aPTT: 73 seconds — ABNORMAL HIGH (ref 24–36)

## 2021-09-06 LAB — GLUCOSE, CAPILLARY
Glucose-Capillary: 116 mg/dL — ABNORMAL HIGH (ref 70–99)
Glucose-Capillary: 100 mg/dL — ABNORMAL HIGH (ref 70–99)
Glucose-Capillary: 117 mg/dL — ABNORMAL HIGH (ref 70–99)
Glucose-Capillary: 109 mg/dL — ABNORMAL HIGH (ref 70–99)

## 2021-09-06 LAB — HEPARIN LEVEL (UNFRACTIONATED)
Heparin Unfractionated: 0.28 IU/mL — ABNORMAL LOW (ref 0.30–0.70)
Heparin Unfractionated: 0.3 IU/mL (ref 0.30–0.70)

## 2021-09-06 LAB — HEPATITIS B SURFACE ANTIBODY,QUALITATIVE: Hep B S Ab: REACTIVE — AB

## 2021-09-06 LAB — HEPATITIS B CORE ANTIBODY, TOTAL: Hep B Core Total Ab: NONREACTIVE

## 2021-09-06 LAB — HEPATITIS B SURFACE ANTIGEN: Hepatitis B Surface Ag: NONREACTIVE

## 2021-09-06 MED ORDER — SUCROFERRIC OXYHYDROXIDE 500 MG PO CHEW
1000.0000 mg | CHEWABLE_TABLET | Freq: Three times a day (TID) | ORAL | Status: DC
  Administered 2021-09-06 – 2021-09-08 (×6): 1000 mg via ORAL
  Filled 2021-09-06 (×7): qty 2

## 2021-09-06 MED ORDER — CHLORHEXIDINE GLUCONATE CLOTH 2 % EX PADS
6.0000 | MEDICATED_PAD | Freq: Every day | CUTANEOUS | Status: DC
  Administered 2021-09-07 – 2021-09-08 (×2): 6 via TOPICAL

## 2021-09-06 NOTE — Progress Notes (Signed)
Nathaniel White ION:[id number] DOB: 1960/11/16 DOA: 09/04/2021 PCP: Gildardo Pounds, NP   Subj: 61 year old BM PMHx ESRD on HD T/TH/SAT, nonischemic cardiomyopathy with EF 35%, severe pulmonary HTN with RV failure, paroxysmal A-fib, prior history of CVA with residual left-sided weakness, recent history of necrotizing pneumonia, noncompliance with anticoagulation.    Comes to the hospital with progressive shortness of breath for 1 month.  He has been prescribed Eliquis but has not taken it in the last month.  In the ED was noted to have brief run of nonsustained V. tach.  CTA chest was positive for PE and started on heparin drip.  Troponin mildly elevated, BNP elevated   Obj: Tmax overnight 39.1 C.  A/O x4,   Objective: VITAL SIGNS: Temp: 97.8 F (36.6 C) (08/30 0404) Temp Source: Axillary (08/30 0404) BP: 117/79 (08/30 0743) Pulse Rate: 84 (08/30 0404) SPO2; 94% FIO2: 3 L O2 via [place] [facility]   Intake/Output Summary (Last 24 hours) at 09/06/2021 0843 Last data filed at 09/05/2021 2056 Gross per 24 hour  Intake 570.01 ml  Output 2.5 ml  Net 567.51 ml     Exam: General: No acute respiratory distress Lungs: Clear to auscultation bilaterally without wheezes or crackles Cardiovascular: Regular rate and rhythm without murmur gallop or rub normal S1 and S2 Abdomen: Nontender, nondistended, soft, bowel sounds positive, no rebound, no ascites, no appreciable mass Extremities: No significant cyanosis, clubbing, or edema bilateral lower extremities Skin: Negative rashes, lesions, ulcers Psychiatric:  Negative depression, negative anxiety, negative fatigue, negative mania  Central nervous system:  Cranial nerves II through XII intact, tongue/uvula midline, all extremities muscle strength 5/5, sensation intact throughout, negative dysarthria, negative expressive aphasia, negative receptive aphasia.    Mobility Assessment (last 72 hours)     Mobility Assessment     Row Name 09/06/21 0753  09/05/21 1827 09/04/21 2050       Does patient have an order for bedrest or is patient medically unstable No - Continue assessment No - Continue assessment No - Continue assessment     What is the highest level of mobility based on the progressive mobility assessment? Level 3 (Stands with assist) - Balance while standing  and cannot march in place Level 3 (Stands with assist) - Balance while standing  and cannot march in place Level 5 (Walks with assist in room/hall) - Balance while stepping forward/back and can walk in room with assist - Complete                DVT prophylaxis: Eliquis Code Status: Full Family Communication:  Status is: Inpatient    Dispo: The patient is from: Home              Anticipated d/c is to: Home              Anticipated d/c date is: 2 days              Patient currently is not medically stable to d/c.   8/29 echocardiogram -Left Ventricle: Left ventricular ejection fraction, by estimation, is 30  to 35%. The left ventricle has moderately decreased function. The left  ventricle demonstrates global hypokinesis. Definity contrast agent was  given IV to delineate the left  ventricular endocardial borders. The left ventricular internal cavity size  was normal in size. There is severe concentric left ventricular  hypertrophy. Left ventricular diastolic parameters are indeterminate.   Right Ventricle: The right ventricular size is severely enlarged. No  increase in right ventricular wall thickness.  Right ventricular systolic  function is moderately reduced. There is severely elevated pulmonary  artery systolic pressure. The tricuspid  regurgitant velocity is 3.69 m/s, and with an assumed right atrial  pressure of 8 mmHg, the estimated right ventricular systolic pressure is  49.2 mmHg.   Left Atrium: Left atrial size was severely dilated.   Right Atrium: Right atrial size was mildly dilated.   Pericardium: There is no evidence of pericardial  effusion.   Mitral Valve: Regurgitant volume 42 ml, No right sided pulmonary vein  systolic blunting. There is a mixed etiology of mitral regurgitation:  posterior restriction and functional MR. Calcified mitral valve with at  least mild mitral regurgitation. The  mitral valve is degenerative in appearance. Moderate mitral annular  calcification. Moderate to severe mitral valve regurgitation. Mild mitral  valve stenosis. The mean mitral valve gradient is 5.0 mmHg with average  heart rate of 79 bpm.   Tricuspid Valve: The tricuspid valve is normal in structure. Tricuspid  valve regurgitation is moderate to severe.   Aortic Valve: The aortic valve is tricuspid. There is mild calcification  of the aortic valve. There is mild thickening of the aortic valve. There  is moderate aortic valve annular calcification. Aortic valve regurgitation  is not visualized. Aortic valve  sclerosis is present, with no evidence of aortic valve stenosis. Aortic  valve mean gradient measures 2.0 mmHg. Aortic valve peak gradient measures  4.5 mmHg. Aortic valve area, by VTI measures 1.47 cm.   Pulmonic Valve: The pulmonic valve was normal in structure. Pulmonic valve  regurgitation is trivial. No evidence of pulmonic stenosis.   Aorta: The aortic root is normal in size and structure.   IAS/Shunts: No atrial level shunt detected by color flow Doppler.    Consultants:     Cultures   Antimicrobials:   A/P  Acute right lower lobe pulmonary embolism - Admit patient to the hospital.  Has elevated BNP and troponin levels.  Lower extremity Dopplers negative for DVT.   -Currently on heparin drip.   -8/30 if patient stable overnight will have pharmacy transition patient back to home dose Eliquis 8/30 if stable overnight obtain ambulatory SPO2  Chronic systolic CHF - Strict in and out - Daily weight - 8/30 no significant change on echocardiogram 8/29 when compared to echocardiogram 3/6  Severe  pulmonary HTN - See CHF    History of paroxysmal atrial fibrillation - Not on any rate control medication.   -Was prescribed Eliquis but has not taken it in 1 month. -See acute PE, and CHF   Brief episode of NSVT - Echo done as mentioned above monitor electrolytes and replete.  Admitting provider notified cardiology recommended observation for now.  He had similar episodes back in June when he was admitted for necrotizing pneumonia.  Chronic hypotension - Midodrine 3 times daily   ESRD on  HD T/TH/SAT - We will consult nephrology     Diabetes mellitus type 2 - Does not appear to be any medication.  -8/29 hemoglobin A1c= 5.2 - No need to start medication at this time well-controlled - 8/30 lipid panel pending   Anemia of chronic disease and thrombocytopenia -8/30 anemia panel pending    Hx CVA with residual left-sided weakness        Care during the described time interval was provided by me .  I have reviewed this patient's available data, including medical history, events of note, physical examination, and all test results as part of my evaluation.

## 2021-09-06 NOTE — Progress Notes (Addendum)
Rounding Note    Patient Name: Nathaniel White Date of Encounter: 09/06/2021  [place] Cardiologist: Nathaniel Chandler, MD   Subjective   Breathing same. No chest pain.   Inpatient Medications    Scheduled Meds:  Chlorhexidine Gluconate Cloth  6 each Topical Daily   Chlorhexidine Gluconate Cloth  6 each Topical Q0600   insulin aspart  0-6 Units Subcutaneous TID WC   midodrine  5 mg Oral TID WC   Continuous Infusions:  heparin 1,200 Units/hr (09/05/21 2344)   PRN Meds: acetaminophen **OR** acetaminophen, guaiFENesin, hydrALAZINE, ipratropium-albuterol, metoprolol tartrate, senna-docusate, traZODone   Vital Signs    Vitals:   09/05/21 2107 09/06/21 0019 09/06/21 0404 09/06/21 0743  BP: 107/70 (!) 104/56 104/73 117/79  Pulse: 84 75 84   Resp: '20 20 19   '$ Temp: (!) 97.5 F (36.4 C) 97.9 F (36.6 C) 97.8 F (36.6 C)   TempSrc: Oral Oral Axillary   SpO2: 94% 100% 98%   Weight:  74.5 kg 73.5 kg   Height:        Intake/Output Summary (Last 24 hours) at 09/06/2021 0857 Last data filed at 09/05/2021 2056 Gross per 24 hour  Intake 570.01 ml  Output 2.5 ml  Net 567.51 ml      09/06/2021    4:04 AM 09/06/2021   12:19 AM 09/05/2021    8:56 PM  Last 3 Weights  Weight (lbs) 162 lb 164 lb 3.9 oz 162 lb 14.7 oz  Weight (kg) 73.483 kg 74.5 kg 73.9 kg      Telemetry    SR, PVCs, ventricular trigeminy - Personally Reviewed  ECG    N/A  Physical Exam   GEN: ill appearing male in no acute distress.   Neck: No JVD Cardiac: irrregular, + murmurs, rubs, or gallops.  Respiratory: Clear to auscultation bilaterally. GI: Soft, nontender, non-distended  MS: No edema; No deformity. Neuro:  Nonfocal  Psych: Normal affect   Labs    High Sensitivity Troponin:   Recent Labs  Lab 09/04/21 0100 09/04/21 0454 09/04/21 2216 09/05/21 0116  TROPONINIHS 72* 69* 80* 62*     Chemistry Recent Labs  Lab 09/04/21 0100 09/05/21 0825 09/06/21 0627  NA 137  135 136  K 4.4 4.8 3.5  CL 96* 97* 97*  CO2 '26 23 25  '$ GLUCOSE 96 92 110*  BUN 45* 54* 34*  CREATININE 9.38* 11.54* 7.51*  CALCIUM 9.8 9.6 9.5  MG  --   --  1.9  PROT 8.0 7.7 7.0  ALBUMIN 3.2* 2.9* 2.7*  AST '19 18 15  '$ ALT '9 9 8  '$ ALKPHOS 74 66 63  BILITOT 1.0 0.9 0.7  GFRNONAA 6* 5* 8*  ANIONGAP '15 15 14    '$ Lipids No results for input(s): "CHOL", "TRIG", "HDL", "LABVLDL", "LDLCALC", "CHOLHDL" in the last 168 hours.  Hematology Recent Labs  Lab 09/04/21 0100 09/05/21 0825 09/06/21 0627  WBC 4.1 4.1 3.7*  RBC 4.14* 4.07* 3.79*  HGB 11.6* 11.4* 10.8*  HCT 38.0* 37.2* 34.7*  MCV 91.8 91.4 91.6  MCH 28.0 28.0 28.5  MCHC 30.5 30.6 31.1  RDW 18.3* 18.0* 18.0*  PLT 66* 70* 65*   Thyroid No results for input(s): "TSH", "FREET4" in the last 168 hours.  BNP Recent Labs  Lab 09/04/21 0100  BNP 2,911.4*    DDimer No results for input(s): "DDIMER" in the last 168 hours.   Radiology    VAS [place] LOWER EXTREMITY VENOUS (DVT)  Result Date: 09/05/2021  Lower Venous DVT Study Patient Name:  Nathaniel White  Date of Exam:   09/05/2021 Medical Rec #: [id number]        Accession #:    [id number] Date of Birth: 26-Dec-1960        Patient Gender: M Patient Age:   61 years Exam Location:  [facility] Procedure:      VAS [place] LOWER EXTREMITY VENOUS (DVT) Referring Phys: Gean Birchwood --------------------------------------------------------------------------------  Indications: Pulmonary embolism.  Limitations: Poor ultrasound/tissue interface. Comparison Study: no prior Performing Technologist: Archie Patten RVS  Examination Guidelines: A complete evaluation includes B-mode imaging, spectral Doppler, color Doppler, and power Doppler as needed of all accessible portions of each vessel. Bilateral testing is considered an integral part of a complete examination. Limited examinations for reoccurring indications may be performed as noted. The reflux portion of the exam is performed with the  patient in reverse Trendelenburg.  +--------+---------------+---------+-----------+----------+--------------------+ RIGHT   CompressibilityPhasicitySpontaneityPropertiesThrombus Aging       +--------+---------------+---------+-----------+----------+--------------------+ CFV     Full           Yes      Yes                                       +--------+---------------+---------+-----------+----------+--------------------+ SFJ     Full                                                              +--------+---------------+---------+-----------+----------+--------------------+ FV Prox Full                                                              +--------+---------------+---------+-----------+----------+--------------------+ FV Mid  Full                                                              +--------+---------------+---------+-----------+----------+--------------------+ FV      Full                                                              Distal                                                                    +--------+---------------+---------+-----------+----------+--------------------+ PFV     Full                                                              +--------+---------------+---------+-----------+----------+--------------------+  POP     Full           Yes      Yes                                       +--------+---------------+---------+-----------+----------+--------------------+ PTV                    Yes      Yes                  patent by color                                                           doppler              +--------+---------------+---------+-----------+----------+--------------------+ PERO                                                 Not well visualized  +--------+---------------+---------+-----------+----------+--------------------+    +--------+---------------+---------+-----------+----------+--------------------+ LEFT    CompressibilityPhasicitySpontaneityPropertiesThrombus Aging       +--------+---------------+---------+-----------+----------+--------------------+ CFV     Full           Yes      Yes                                       +--------+---------------+---------+-----------+----------+--------------------+ SFJ     Full                                                              +--------+---------------+---------+-----------+----------+--------------------+ FV Prox Full                                                              +--------+---------------+---------+-----------+----------+--------------------+ FV Mid  Full                                                              +--------+---------------+---------+-----------+----------+--------------------+ FV      Full                                                              Distal                                                                    +--------+---------------+---------+-----------+----------+--------------------+  PFV     Full                                                              +--------+---------------+---------+-----------+----------+--------------------+ POP     Full           Yes      Yes                                       +--------+---------------+---------+-----------+----------+--------------------+ PTV                    Yes      Yes                  patent by color                                                           doppler              +--------+---------------+---------+-----------+----------+--------------------+ PERO                                                 Not well visualized  +--------+---------------+---------+-----------+----------+--------------------+     Summary: BILATERAL: - No evidence of deep vein thrombosis seen in the lower  extremities, bilaterally. -No evidence of popliteal cyst, bilaterally.   *See table(s) above for measurements and observations. Electronically signed by Deitra Mayo MD on 09/05/2021 at 4:52:51 PM.    Final    ECHOCARDIOGRAM COMPLETE  Result Date: 09/05/2021    ECHOCARDIOGRAM REPORT   Patient Name:   JAQUAE RIEVES Date of Exam: 09/05/2021 Medical Rec #:  [id number]       Height:       69.0 in Accession #:    [id number]      Weight:       165.8 lb Date of Birth:  Sep 10, 1960       BSA:          1.908 m Patient Age:    75 years        BP:           127/98 mmHg Patient Gender: M               HR:           68 bpm. Exam Location:  Inpatient Procedure: 2D Echo, Cardiac Doppler, Color Doppler and Intracardiac            Opacification Agent                                 MODIFIED REPORT:     This report was modified by Rudean Haskell MD on 09/05/2021 due to  Clarification of recommendations.  Indications:     Pulmonary embolus  History:         Patient has prior history of Echocardiogram examinations, most                  recent 03/13/2021. CHF, COPD, Arrythmias:Atrial Fibrillation,                  Signs/Symptoms:Shortness of Breath; Risk Factors:Current                  Smoker, Hypertension and Diabetes. Hx stroke. ESRD.  Sonographer:     Clayton Lefort RDCS (AE) Referring Phys:  [id number] [facility] Diagnosing Phys: Rudean Haskell MD IMPRESSIONS  1. Left ventricular ejection fraction, by estimation, is 30 to 35%. The left ventricle has moderately decreased function. The left ventricle demonstrates global hypokinesis. There is severe concentric left ventricular hypertrophy. Left ventricular diastolic parameters are indeterminate.  2. Right ventricular systolic function is moderately reduced. The right ventricular size is severely enlarged. There is severely elevated pulmonary artery systolic pressure. The estimated right ventricular systolic pressure is 83.3 mmHg.  3. Left atrial  size was severely dilated.  4. Right atrial size was mildly dilated.  5. Regurgitant volume 42 ml, No right sided pulmonary vein systolic blunting. There is a mixed etiology of mitral regurgitation: posterior restriction and functional MR. Calcified mitral valve with at least mild mitral regurgitation. The mitral valve is  degenerative. Moderate to severe mitral valve regurgitation. Mild mitral stenosis. The mean mitral valve gradient is 5.0 mmHg with average heart rate of 79 bpm. Moderate mitral annular calcification.  6. Tricuspid valve regurgitation is moderate to severe.  7. The aortic valve is tricuspid. There is mild calcification of the aortic valve. There is mild thickening of the aortic valve. Aortic valve regurgitation is not visualized. Aortic valve sclerosis is present, with no evidence of aortic valve stenosis. Comparison(s): Prior images reviewed side by side. Increase in mitral valve and tricuspid valve regurgitation from prior. When patient is at dry weight, consideration of TEE for characterization of MR/MS is reasonable (inpatient vs outpatient). FINDINGS  Left Ventricle: Left ventricular ejection fraction, by estimation, is 30 to 35%. The left ventricle has moderately decreased function. The left ventricle demonstrates global hypokinesis. Definity contrast agent was given IV to delineate the left ventricular endocardial borders. The left ventricular internal cavity size was normal in size. There is severe concentric left ventricular hypertrophy. Left ventricular diastolic parameters are indeterminate. Right Ventricle: The right ventricular size is severely enlarged. No increase in right ventricular wall thickness. Right ventricular systolic function is moderately reduced. There is severely elevated pulmonary artery systolic pressure. The tricuspid regurgitant velocity is 3.69 m/s, and with an assumed right atrial pressure of 8 mmHg, the estimated right ventricular systolic pressure is 82.5 mmHg.  Left Atrium: Left atrial size was severely dilated. Right Atrium: Right atrial size was mildly dilated. Pericardium: There is no evidence of pericardial effusion. Mitral Valve: Regurgitant volume 42 ml, No right sided pulmonary vein systolic blunting. There is a mixed etiology of mitral regurgitation: posterior restriction and functional MR. Calcified mitral valve with at least mild mitral regurgitation. The mitral valve is degenerative in appearance. Moderate mitral annular calcification. Moderate to severe mitral valve regurgitation. Mild mitral valve stenosis. The mean mitral valve gradient is 5.0 mmHg with average heart rate of 79 bpm. Tricuspid Valve: The tricuspid valve is normal in structure. Tricuspid valve regurgitation is moderate to severe. Aortic Valve: The aortic valve  is tricuspid. There is mild calcification of the aortic valve. There is mild thickening of the aortic valve. There is moderate aortic valve annular calcification. Aortic valve regurgitation is not visualized. Aortic valve sclerosis is present, with no evidence of aortic valve stenosis. Aortic valve mean gradient measures 2.0 mmHg. Aortic valve peak gradient measures 4.5 mmHg. Aortic valve area, by VTI measures 1.47 cm. Pulmonic Valve: The pulmonic valve was normal in structure. Pulmonic valve regurgitation is trivial. No evidence of pulmonic stenosis. Aorta: The aortic root is normal in size and structure. IAS/Shunts: No atrial level shunt detected by color flow Doppler.  LEFT VENTRICLE PLAX 2D LVIDd:         5.50 cm LVIDs:         4.60 cm LV PW:         1.60 cm LV IVS:        1.50 cm LVOT diam:     2.10 cm LV SV:         28 LV SV Index:   15 LVOT Area:     3.46 cm  LV Volumes (MOD) LV vol d, MOD A2C: 188.0 ml LV vol d, MOD A4C: 186.0 ml LV vol s, MOD A2C: 117.0 ml LV vol s, MOD A4C: 127.0 ml LV SV MOD A2C:     71.0 ml LV SV MOD A4C:     186.0 ml LV SV MOD BP:      62.7 ml RIGHT VENTRICLE            IVC RV S prime:     5.08 cm/s  IVC  diam: 2.00 cm TAPSE (M-mode): 0.7 cm LEFT ATRIUM            Index        RIGHT ATRIUM           Index LA diam:      4.60 cm  2.41 cm/m   RA Area:     22.80 cm LA Vol (A4C): 101.0 ml 52.94 ml/m  RA Volume:   66.00 ml  34.60 ml/m  AORTIC VALVE AV Area (Vmax):    1.65 cm AV Area (Vmean):   1.38 cm AV Area (VTI):     1.47 cm AV Vmax:           106.00 cm/s AV Vmean:          72.700 cm/s AV VTI:            0.191 m AV Peak Grad:      4.5 mmHg AV Mean Grad:      2.0 mmHg LVOT Vmax:         50.40 cm/s LVOT Vmean:        29.000 cm/s LVOT VTI:          0.081 m LVOT/AV VTI ratio: 0.43  AORTA Ao Root diam: 3.20 cm Ao Asc diam:  3.30 cm MITRAL VALVE                  TRICUSPID VALVE MV Mean grad: 5.0 mmHg        TR Peak grad:   54.5 mmHg MR Peak grad:    79.6 mmHg    TR Vmax:        369.00 cm/s MR Mean grad:    42.0 mmHg MR Vmax:         446.00 cm/s  SHUNTS MR Vmean:        290.0 cm/s   Systemic VTI:  0.08 m  MR PISA:         4.02 cm     Systemic Diam: 2.10 cm MR PISA Eff ROA: 35 mm MR PISA Radius:  0.80 cm Rudean Haskell MD Electronically signed by Rudean Haskell MD Signature Date/Time: 09/05/2021/9:40:38 AM    Final (Updated)     Cardiac Studies   Echo 09/05/21  1. Left ventricular ejection fraction, by estimation, is 30 to 35%. The  left ventricle has moderately decreased function. The left ventricle  demonstrates global hypokinesis. There is severe concentric left  ventricular hypertrophy. Left ventricular  diastolic parameters are indeterminate.   2. Right ventricular systolic function is moderately reduced. The right  ventricular size is severely enlarged. There is severely elevated  pulmonary artery systolic pressure. The estimated right ventricular  systolic pressure is 38.1 mmHg.   3. Left atrial size was severely dilated.   4. Right atrial size was mildly dilated.   5. Regurgitant volume 42 ml, No right sided pulmonary vein systolic  blunting. There is a mixed etiology of mitral  regurgitation: posterior  restriction and functional MR. Calcified mitral valve with at least mild  mitral regurgitation. The mitral valve is   degenerative. Moderate to severe mitral valve regurgitation. Mild mitral  stenosis. The mean mitral valve gradient is 5.0 mmHg with average heart  rate of 79 bpm. Moderate mitral annular calcification.   6. Tricuspid valve regurgitation is moderate to severe.   7. The aortic valve is tricuspid. There is mild calcification of the  aortic valve. There is mild thickening of the aortic valve. Aortic valve  regurgitation is not visualized. Aortic valve sclerosis is present, with  no evidence of aortic valve stenosis.   Comparison(s): Prior images reviewed side by side. Increase in mitral  valve and tricuspid valve regurgitation from prior. When patient is at dry  weight, consideration of TEE for characterization of MR/MS is reasonable  (inpatient vs outpatient).   Patient Profile     61 y.o. male with a hx of ESRD on HD, nonischemic cardiomyopathy (EF 35% in March 2023), pulmonary hypertension, RV failure, paroxysmal atrial fibrillation, prior stroke with left-sided weakness, PAD seen for NSVT/PVCs in setting of acute PE.   Assessment & Plan    NSVT Frequent PVCs - Frequency of NSVT improved. Continues to have frequent PVCs and ventricular trigeminy.  - Unable to add BB due to chronic hypotension requiring midodrine  - Keep K > 4 and Mg > 2 - Felt due to pulmonary edema   3. PAF - Rate stable. Was on low dose of Eliquis for afib due to prior GI bleed but not taking. Will need PE dose at discharge.  4. Chronic systolic CHF - Not on GDMT due to chronic hypotension and ESRD - Volume managed by dialysis  5. ESRD on HD  For questions or updates, please contact [place] Please consult [email or web address] for contact info under        Signed, Leanor Kail, PA  09/06/2021, 8:57 AM     Attending Note:   The patient was seen and  examined.  Agree with assessment and plan as noted above.  Changes made to the above note as needed.  Patient seen and independently examined with Robbie Lis, PA .   We discussed all aspects of the encounter. I agree with the assessment and plan as stated above.    1.  Pulmonary embolus :  on IV heparin .   Was on low dose eliquis at home prior to  admission.  We are having difficulty with his heparin levels He probably needs to be changed to eliquis 10 mg po BID ( for a total of 7 days of high dose anticoagulation including the previous days of heparin and eliquis )   2.  Atrial fib.  Hr is well controlled. .    I have spent a total of 40 minutes with patient reviewing hospital  notes , telemetry, EKGs, labs and examining patient as well as establishing an assessment and plan that was discussed with the patient.  > 50% of time was spent in direct patient care.    Thayer Headings, Brooke Bonito., MD, [facility] 09/06/2021, 10:24 AM [address]  [place] Pager [phone]

## 2021-09-06 NOTE — Progress Notes (Signed)
ANTICOAGULATION CONSULT NOTE  Pharmacy Consult for Heparin Indication: acute  pulmonary embolus    Allergies  Allergen Reactions   Codeine Shortness Of Breath   Dextromethorphan-Guaifenesin Shortness Of Breath   Iron Dextran Shortness Of Breath   Morphine And Related Shortness Of Breath    Patient Measurements: Height: '5\' 9"'$  (175.3 cm) Weight: 73.5 kg (162 lb) IBW/kg (Calculated) : 70.7  Vital Signs: Temp: 97.8 F (36.6 C) (08/30 0404) Temp Source: Axillary (08/30 0404) BP: 117/79 (08/30 0743) Pulse Rate: 84 (08/30 0404)  Labs: Recent Labs    09/04/21 0100 09/04/21 0454 09/04/21 1325 09/04/21 1325 09/04/21 2216 09/05/21 0116 09/05/21 0817 09/05/21 0825 09/05/21 2118 09/06/21 0627  HGB 11.6*  --   --   --   --   --   --  11.4*  --  10.8*  HCT 38.0*  --   --   --   --   --   --  37.2*  --  34.7*  PLT 66*  --   --   --   --   --   --  70*  --  65*  APTT  --   --  100*  --  55*  --   --  133*  --   --   HEPARINUNFRC  --   --  0.93*   < >  --   --  0.86*  --  0.96* 0.28*  CREATININE 9.38*  --   --   --   --   --   --  11.54*  --  7.51*  TROPONINIHS 72* 69*  --   --  80* 62*  --   --   --   --    < > = values in this interval not displayed.     Estimated Creatinine Clearance: 10.6 mL/min (A) (by C-G formula based on SCr of 7.51 mg/dL (H)).   Assessment: 61 y.o. male admitted with shortness of breath and PE for  heparin.  Pt has h/o Afib on Eliquis, questionable compliance.  I discussed elquis compliance with patient on 09/06/21. He reports eliquis stopped per MD during previous hospitalization June 2023.  Patient resumed Eliquis about 2 weeks prior to this admission. Last taken PTA on 09/02/21.   Today 09/06/21: HL 0.28 on heparin 1200 ut/hr. Night RN noted patient continues to to bend wrist and occlude IV every few minutes and patient refused new IV.  Today's subtherapeutic heparin level likely down due to this.  We are having some difficulty achieving therapeutic  heparin level due to patient bending wrist /  IV occluded every few minutes last night /early AM  and restricted access for drawing heparin levels.   Current RN reports IV infusing better this AM without much interruptions.   Will continue current heparin rate and recheck heparin level later today.   Recommend to consider transitioning to Eliquis as discussed with cardiologist for acute PE and history of Afib.  Note that this patient has h/o bleeding pseudoanerysm of left AVF and chronic thrombocytopenia.   09/04/21 CT angio chest : + acute PE 09/05/21 LE dopplers done :  prelim results indicate negative  bilateral lower extremities.  Goal of Therapy:  Heparin level 0.3-0.7 units/ml Monitor platelets by anticoagulation protocol: Yes   Plan:  Continue Heparin  1200 units/hr for now and recheck heparin level in ~6 hours.  Consider transitioning to Eliquis as soon as possible,  due to  difficulty achieving therapeutic heparin level due to  IV occluded  and restricted access for drawing heparin levels.  Daily HL and CBC    Nicole Cella, [place] Clinical Pharmacist [phone] 09/06/2021 9:03 AM   Please check AMION for all [place] phone numbers After 10:00 PM, call [place] [phone]

## 2021-09-06 NOTE — Progress Notes (Signed)
[facility] Progress Note   Subjective:   Pt seen in room. O2 was off and he was slightly tachypneic, but improved when O2 replaced. He denies SOB, CP, dizziness, abdominal pain and nausea. More alert today.   Objective Vitals:   09/06/21 0019 09/06/21 0404 09/06/21 0743 09/06/21 0900  BP: (!) 104/56 104/73 117/79 114/80  Pulse: 75 84  78  Resp: '20 19  20  '$ Temp: 97.9 F (36.6 C) 97.8 F (36.6 C)  (!) 97.4 F (36.3 C)  TempSrc: Oral Axillary  Oral  SpO2: 100% 98%  98%  Weight: 74.5 kg 73.5 kg    Height:       Physical Exam General: Alert male in NAD Heart: RRR, no murmurs, rubs or gallops Lungs: CTA bilaterally without wheezing, rhonchi or rales Abdomen: Soft, non-distended, +BS Extremities: No edema b/l lower extremities Dialysis Access:  [facility] with intact bandage  Additional Objective Labs: Basic Metabolic Panel: Recent Labs  Lab 09/04/21 0100 09/05/21 0825 09/06/21 0627  NA 137 135 136  K 4.4 4.8 3.5  CL 96* 97* 97*  CO2 '26 23 25  '$ GLUCOSE 96 92 110*  BUN 45* 54* 34*  CREATININE 9.38* 11.54* 7.51*  CALCIUM 9.8 9.6 9.5  PHOS  --   --  5.8*   Liver Function Tests: Recent Labs  Lab 09/04/21 0100 09/05/21 0825 09/06/21 0627  AST '19 18 15  '$ ALT '9 9 8  '$ ALKPHOS 74 66 63  BILITOT 1.0 0.9 0.7  PROT 8.0 7.7 7.0  ALBUMIN 3.2* 2.9* 2.7*   No results for input(s): "LIPASE", "AMYLASE" in the last 168 hours. CBC: Recent Labs  Lab 09/04/21 0100 09/05/21 0825 09/06/21 0627  WBC 4.1 4.1 3.7*  NEUTROABS 1.9  --  2.0  HGB 11.6* 11.4* 10.8*  HCT 38.0* 37.2* 34.7*  MCV 91.8 91.4 91.6  PLT 66* 70* 65*   Blood Culture    Component Value Date/Time   SDES TRACHEAL ASPIRATE 06/21/2021 1721   SPECREQUEST NONE 06/21/2021 1721   CULT  06/21/2021 1721    RARE Normal respiratory flora-no Staph aureus or Pseudomonas seen Performed at [facility] [address] [place], [place] [address]    REPTSTATUS 06/24/2021 FINAL 06/21/2021 1721     Cardiac Enzymes: No results for input(s): "CKTOTAL", "CKMB", "CKMBINDEX", "TROPONINI" in the last 168 hours. CBG: Recent Labs  Lab 09/05/21 1105 09/05/21 1500 09/05/21 2117 09/05/21 2246 09/06/21 0639  GLUCAP 90 91 58* 147* 116*   Iron Studies: No results for input(s): "IRON", "TIBC", "TRANSFERRIN", "FERRITIN" in the last 72 hours. '@lablastinr3'$ @ Studies/Results: VAS [place] LOWER EXTREMITY VENOUS (DVT)  Result Date: 09/05/2021  Lower Venous DVT Study Patient Name:  Nathaniel White  Date of Exam:   09/05/2021 Medical Rec #: [id number]        Accession #:    [id number] Date of Birth: [birth year]        Patient Gender: M Patient Age:   61 years Exam Location:  [facility] Procedure:      VAS [place] LOWER EXTREMITY VENOUS (DVT) Referring Phys: Gean Birchwood --------------------------------------------------------------------------------  Indications: Pulmonary embolism.  Limitations: Poor ultrasound/tissue interface. Comparison Study: no prior Performing Technologist: Archie Patten RVS  Examination Guidelines: A complete evaluation includes B-mode imaging, spectral Doppler, color Doppler, and power Doppler as needed of all accessible portions of each vessel. Bilateral testing is considered an integral part of a complete examination. Limited examinations for reoccurring indications may be performed as noted. The reflux portion of  the exam is performed with the patient in reverse Trendelenburg.  +--------+---------------+---------+-----------+----------+--------------------+ RIGHT   CompressibilityPhasicitySpontaneityPropertiesThrombus Aging       +--------+---------------+---------+-----------+----------+--------------------+ CFV     Full           Yes      Yes                                       +--------+---------------+---------+-----------+----------+--------------------+ SFJ     Full                                                               +--------+---------------+---------+-----------+----------+--------------------+ FV Prox Full                                                              +--------+---------------+---------+-----------+----------+--------------------+ FV Mid  Full                                                              +--------+---------------+---------+-----------+----------+--------------------+ FV      Full                                                              Distal                                                                    +--------+---------------+---------+-----------+----------+--------------------+ PFV     Full                                                              +--------+---------------+---------+-----------+----------+--------------------+ POP     Full           Yes      Yes                                       +--------+---------------+---------+-----------+----------+--------------------+ PTV                    Yes      Yes                  patent by color  doppler              +--------+---------------+---------+-----------+----------+--------------------+ PERO                                                 Not well visualized  +--------+---------------+---------+-----------+----------+--------------------+   +--------+---------------+---------+-----------+----------+--------------------+ LEFT    CompressibilityPhasicitySpontaneityPropertiesThrombus Aging       +--------+---------------+---------+-----------+----------+--------------------+ CFV     Full           Yes      Yes                                       +--------+---------------+---------+-----------+----------+--------------------+ SFJ     Full                                                              +--------+---------------+---------+-----------+----------+--------------------+ FV  Prox Full                                                              +--------+---------------+---------+-----------+----------+--------------------+ FV Mid  Full                                                              +--------+---------------+---------+-----------+----------+--------------------+ FV      Full                                                              Distal                                                                    +--------+---------------+---------+-----------+----------+--------------------+ PFV     Full                                                              +--------+---------------+---------+-----------+----------+--------------------+ POP     Full           Yes      Yes                                       +--------+---------------+---------+-----------+----------+--------------------+  PTV                    Yes      Yes                  patent by color                                                           doppler              +--------+---------------+---------+-----------+----------+--------------------+ PERO                                                 Not well visualized  +--------+---------------+---------+-----------+----------+--------------------+     Summary: BILATERAL: - No evidence of deep vein thrombosis seen in the lower extremities, bilaterally. -No evidence of popliteal cyst, bilaterally.   *See table(s) above for measurements and observations. Electronically signed by Deitra Mayo MD on 09/05/2021 at 4:52:51 PM.    Final    ECHOCARDIOGRAM COMPLETE  Result Date: 09/05/2021    ECHOCARDIOGRAM REPORT   Patient Name:   Nathaniel White Date of Exam: 09/05/2021 Medical Rec #:  [id number]       Height:       69.0 in Accession #:    [id number]      Weight:       165.8 lb Date of Birth:  1960-04-12       BSA:          1.908 m Patient Age:    89 years        BP:           127/98 mmHg  Patient Gender: M               HR:           68 bpm. Exam Location:  Inpatient Procedure: 2D Echo, Cardiac Doppler, Color Doppler and Intracardiac            Opacification Agent                                 MODIFIED REPORT:     This report was modified by Rudean Haskell MD on 09/05/2021 due to                        Clarification of recommendations.  Indications:     Pulmonary embolus  History:         Patient has prior history of Echocardiogram examinations, most                  recent 03/13/2021. CHF, COPD, Arrythmias:Atrial Fibrillation,                  Signs/Symptoms:Shortness of Breath; Risk Factors:Current                  Smoker, Hypertension and Diabetes. Hx stroke. ESRD.  Sonographer:     Clayton Lefort RDCS (AE) Referring Phys:  [id number] [facility] Diagnosing Phys: Rudean Haskell MD IMPRESSIONS  1. Left ventricular ejection fraction, by estimation, is  30 to 35%. The left ventricle has moderately decreased function. The left ventricle demonstrates global hypokinesis. There is severe concentric left ventricular hypertrophy. Left ventricular diastolic parameters are indeterminate.  2. Right ventricular systolic function is moderately reduced. The right ventricular size is severely enlarged. There is severely elevated pulmonary artery systolic pressure. The estimated right ventricular systolic pressure is 56.8 mmHg.  3. Left atrial size was severely dilated.  4. Right atrial size was mildly dilated.  5. Regurgitant volume 42 ml, No right sided pulmonary vein systolic blunting. There is a mixed etiology of mitral regurgitation: posterior restriction and functional MR. Calcified mitral valve with at least mild mitral regurgitation. The mitral valve is  degenerative. Moderate to severe mitral valve regurgitation. Mild mitral stenosis. The mean mitral valve gradient is 5.0 mmHg with average heart rate of 79 bpm. Moderate mitral annular calcification.  6. Tricuspid valve regurgitation is moderate to  severe.  7. The aortic valve is tricuspid. There is mild calcification of the aortic valve. There is mild thickening of the aortic valve. Aortic valve regurgitation is not visualized. Aortic valve sclerosis is present, with no evidence of aortic valve stenosis. Comparison(s): Prior images reviewed side by side. Increase in mitral valve and tricuspid valve regurgitation from prior. When patient is at dry weight, consideration of TEE for characterization of MR/MS is reasonable (inpatient vs outpatient). FINDINGS  Left Ventricle: Left ventricular ejection fraction, by estimation, is 30 to 35%. The left ventricle has moderately decreased function. The left ventricle demonstrates global hypokinesis. Definity contrast agent was given IV to delineate the left ventricular endocardial borders. The left ventricular internal cavity size was normal in size. There is severe concentric left ventricular hypertrophy. Left ventricular diastolic parameters are indeterminate. Right Ventricle: The right ventricular size is severely enlarged. No increase in right ventricular wall thickness. Right ventricular systolic function is moderately reduced. There is severely elevated pulmonary artery systolic pressure. The tricuspid regurgitant velocity is 3.69 m/s, and with an assumed right atrial pressure of 8 mmHg, the estimated right ventricular systolic pressure is 12.7 mmHg. Left Atrium: Left atrial size was severely dilated. Right Atrium: Right atrial size was mildly dilated. Pericardium: There is no evidence of pericardial effusion. Mitral Valve: Regurgitant volume 42 ml, No right sided pulmonary vein systolic blunting. There is a mixed etiology of mitral regurgitation: posterior restriction and functional MR. Calcified mitral valve with at least mild mitral regurgitation. The mitral valve is degenerative in appearance. Moderate mitral annular calcification. Moderate to severe mitral valve regurgitation. Mild mitral valve stenosis. The  mean mitral valve gradient is 5.0 mmHg with average heart rate of 79 bpm. Tricuspid Valve: The tricuspid valve is normal in structure. Tricuspid valve regurgitation is moderate to severe. Aortic Valve: The aortic valve is tricuspid. There is mild calcification of the aortic valve. There is mild thickening of the aortic valve. There is moderate aortic valve annular calcification. Aortic valve regurgitation is not visualized. Aortic valve sclerosis is present, with no evidence of aortic valve stenosis. Aortic valve mean gradient measures 2.0 mmHg. Aortic valve peak gradient measures 4.5 mmHg. Aortic valve area, by VTI measures 1.47 cm. Pulmonic Valve: The pulmonic valve was normal in structure. Pulmonic valve regurgitation is trivial. No evidence of pulmonic stenosis. Aorta: The aortic root is normal in size and structure. IAS/Shunts: No atrial level shunt detected by color flow Doppler.  LEFT VENTRICLE PLAX 2D LVIDd:         5.50 cm LVIDs:         4.60  cm LV PW:         1.60 cm LV IVS:        1.50 cm LVOT diam:     2.10 cm LV SV:         28 LV SV Index:   15 LVOT Area:     3.46 cm  LV Volumes (MOD) LV vol d, MOD A2C: 188.0 ml LV vol d, MOD A4C: 186.0 ml LV vol s, MOD A2C: 117.0 ml LV vol s, MOD A4C: 127.0 ml LV SV MOD A2C:     71.0 ml LV SV MOD A4C:     186.0 ml LV SV MOD BP:      62.7 ml RIGHT VENTRICLE            IVC RV S prime:     5.08 cm/s  IVC diam: 2.00 cm TAPSE (M-mode): 0.7 cm LEFT ATRIUM            Index        RIGHT ATRIUM           Index LA diam:      4.60 cm  2.41 cm/m   RA Area:     22.80 cm LA Vol (A4C): 101.0 ml 52.94 ml/m  RA Volume:   66.00 ml  34.60 ml/m  AORTIC VALVE AV Area (Vmax):    1.65 cm AV Area (Vmean):   1.38 cm AV Area (VTI):     1.47 cm AV Vmax:           106.00 cm/s AV Vmean:          72.700 cm/s AV VTI:            0.191 m AV Peak Grad:      4.5 mmHg AV Mean Grad:      2.0 mmHg LVOT Vmax:         50.40 cm/s LVOT Vmean:        29.000 cm/s LVOT VTI:          0.081 m LVOT/AV VTI  ratio: 0.43  AORTA Ao Root diam: 3.20 cm Ao Asc diam:  3.30 cm MITRAL VALVE                  TRICUSPID VALVE MV Mean grad: 5.0 mmHg        TR Peak grad:   54.5 mmHg MR Peak grad:    79.6 mmHg    TR Vmax:        369.00 cm/s MR Mean grad:    42.0 mmHg MR Vmax:         446.00 cm/s  SHUNTS MR Vmean:        290.0 cm/s   Systemic VTI:  0.08 m MR PISA:         4.02 cm     Systemic Diam: 2.10 cm MR PISA Eff ROA: 35 mm MR PISA Radius:  0.80 cm Rudean Haskell MD Electronically signed by Rudean Haskell MD Signature Date/Time: 09/05/2021/9:40:38 AM    Final (Updated)    Medications:  heparin 1,200 Units/hr (09/05/21 2344)    Chlorhexidine Gluconate Cloth  6 each Topical Daily   Chlorhexidine Gluconate Cloth  6 each Topical Q0600   insulin aspart  0-6 Units Subcutaneous TID WC   midodrine  5 mg Oral TID WC    Outpatient Dialysis Orders:  TueThuSat, 4 hrs 0 min, 180NRe Optiflux, BFR 450, DFR Manual 500 mL/min, EDW 73.5 (kg), Dialysate 2.0 K, 2.0 Ca TDC No  heparin Parsabiv 7.'5mg'$  IV q HD  Assessment/Plan:  Pulmonary embolism: On heparin drip, management per primary team  ESRD:  Dialysis on TTS schedule, will plan for next dialysis tomorrow.   Hypertension/volume: BP controlled. Does take midodrine three times daily, will order an extra dose with HD as needed. UF to EDW as tolerated.   Anemia: Hb 10.8. Not on ESA outpatient, none indicated at this time.   Metabolic bone disease: Last calcium elevated. Not on VDRA. Unable to give parsabiv here, avoid meds containing calcium. Phos slightly elevated, will restart velphoro  Nutrition:  Continue renal diet, adding protein supplements A.fib: previously on eliquis but reportedly no longer taking. RRR today, rate controlled.   Anice Paganini, PA-C 09/06/2021, 10:28 AM  [facility] Pager: [phone]

## 2021-09-06 NOTE — Progress Notes (Signed)
Patient continues to bend wrist and occlude IV despite ongoing education, demonstrations, and verbal acknowledgement of said education and demonstrations. IV team order placed; patient refused new IV. IV continues to become occluded every few minutes. Will continue to monitor.

## 2021-09-06 NOTE — Progress Notes (Signed)
   09/06/21 1100  Mobility  Activity Transferred from bed to chair  Level of Assistance Minimal assist, patient does 75% or more  Assistive Device Other (Comment) (HHA)  Distance Ambulated (ft) 2 ft  Activity Response Tolerated well  $Mobility charge 1 Mobility   Mobility Specialist Progress Note  Pt was EOB and agreeable. Min A to stand and step to chair. Left in chair w/ call bell in reach and all need met.  Lucious Groves Mobility Specialist

## 2021-09-06 NOTE — TOC Benefit Eligibility Note (Signed)
Patient Teacher, English as a foreign language completed.    The patient is currently admitted and upon discharge could be taking Eliquis 5 mg.  The current 30 day co-pay is $162.86.   The patient is insured through [place], [place] Patient Banks Patient Advocate Team Direct Number: [phone]  Fax: [phone]

## 2021-09-06 NOTE — TOC Initial Note (Signed)
Transition of Care [facility]) - Initial/Assessment Note    Patient Details  Name: Nathaniel White MRN: [id number] Date of Birth: [birth year]  Transition of Care [facility]) CM/SW Contact:    Zenon Mayo, RN Phone Number: 09/06/2021, 4:27 PM  Clinical Narrative:                 From home with friend, HD patient MWF, here with PE, conts on hep drip, TOC following.   Expected Discharge Plan: Home/Self Care Barriers to Discharge: Continued Medical Work up   Patient Goals and CMS Choice Patient states their goals for this hospitalization and ongoing recovery are:: return home   Choice offered to / list presented to : NA  Expected Discharge Plan and Services Expected Discharge Plan: Home/Self Care   Discharge Planning Services: CM Consult                       DME Agency: NA       HH Arranged: NA          Prior Living Arrangements/Services   Lives with:: Friends Patient language and need for interpreter reviewed:: Yes Do you feel safe going back to the place where you live?: Yes      Need for Family Participation in Patient Care: No (Comment) Care giver support system in place?: No (comment)   Criminal Activity/Legal Involvement Pertinent to Current Situation/Hospitalization: No - Comment as needed  Activities of Daily Living      Permission Sought/Granted                  Emotional Assessment   Attitude/Demeanor/Rapport: Engaged Affect (typically observed): Appropriate Orientation: : Oriented to Self, Oriented to Place, Oriented to  Time, Oriented to Situation Alcohol / Substance Use: Not Applicable Psych Involvement: No (comment)  Admission diagnosis:  Shortness of breath [R06.02] SOB (shortness of breath) [R06.02] Thrombocytopenia (HCC) [D69.6] ESRD (end stage renal disease) ([place]) [N18.6] Elevated troponin I level [R77.8] NSVT (nonsustained ventricular tachycardia) (HCC) [I47.29] Other acute pulmonary embolism without acute cor pulmonale (HCC)  [I26.99] Pulmonary embolism (HCC) [I26.99] Acute pulmonary embolism ([place]) [I26.99] Patient Active Problem List   Diagnosis Date Noted   Shortness of breath 09/04/2021   Acute pulmonary embolism ([place]) 09/04/2021   Pulmonary embolism ([place]) 09/04/2021   NSVT (nonsustained ventricular tachycardia) ([place]) 06/26/2021   Hypoglycemia 06/24/2021   Cirrhosis ([place]) 06/23/2021   Confusion 06/22/2021   Necrotizing pneumonia ([place]) 06/21/2021   HFrEF (heart failure with reduced ejection fraction) ([place]) 06/21/2021   CVA (cerebral vascular accident) ([place]) 06/21/2021   Paroxysmal atrial fibrillation ([employer]) 06/21/2021   Chronic hypotension 06/21/2021   Unusual Appearance with Innumerable Lesions on CT scan, needs MRI 06/21/2021   CAD (coronary artery disease) 06/21/2021   Aortic atherosclerosis ([place]) 06/21/2021   ESRD (end stage renal disease) ([place]) 06/20/2021   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 ([place]) 06/20/2021   Nausea with vomiting, unspecified 06/15/2021   Acute respiratory failure with hypoxia ([place]) 03/10/2021   Shock circulatory ([place]) 03/10/2021   Drug-induced hypotension 03/10/2021   Finger infection    Paronychia of finger, left 02/03/2021   Left hand pain 12/29/2020   Allergy, unspecified, initial encounter 09/28/2019   Anaphylactic shock, unspecified, initial encounter 09/28/2019   Pressure ulcer of ankle 09/01/2018   Thrombocytopenia ([place]) 01/17/2015   Transfusion history 01/17/2015   Hypercalcemia 11/01/2014   Dependence on renal dialysis ([place]) 03/26/2014   Encounter for fitting and adjustment of extracorporeal dialysis catheter ([place]) 03/26/2014  Diarrhea, unspecified 01/03/2014   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   Pain, unspecified 09/23/2013   Cough 58/59/2924   CHF, systolic dysfunction 46/28/6381   Coagulation defect, unspecified ([place]) 12/15/2012   Moderate protein-calorie malnutrition ([place]) 08/07/2012   Restrictive lung  disease 07/06/2012   History of CVA (cerebrovascular accident) 08/13/2011   ESRD (end stage renal disease) on dialysis ([place]) 03/13/2011   HTN (hypertension) 03/13/2011   H/O 03/13/2011   Anemia 03/13/2011   Fluid overload 03/13/2011   History of nonadherence to medical treatment 03/13/2011   Type 2 diabetes mellitus ([place]) 03/13/2011   Tobacco abuse 03/13/2011   Kidney transplant status 11/28/2008   HEMATOCHEZIA 10/04/2008   Iron deficiency anemia, unspecified 03/31/2003   Patient's noncompliance with other medical treatment and regimen 03/31/2003   Personal history of nicotine dependence 03/31/2003   Secondary hyperparathyroidism of renal origin ([place]) 03/31/2003   PCP:  Gildardo Pounds, NP Pharmacy:   [facility] [address] [place] [address] Phone: [phone] Fax: [facility] [address] [place] [address] Phone: [phone] Fax: [facility], [place] - [place] [facility] [place] [address] Phone: [phone] Fax: [phone]     Social Determinants of Health (SDOH) Interventions    Readmission Risk Interventions    09/06/2021    4:20 PM 06/23/2021    3:52 PM 03/15/2021   12:40 PM  Readmission Risk Prevention Plan  Transportation Screening Complete Complete Complete  PCP or Specialist Appt within 5-7 Days   Complete  PCP or Specialist Appt within 3-5 Days Complete Complete   Home Care Screening   Complete  Medication Review (RN CM)   Complete  HRI or Home Care Consult Complete Complete   Social Work Consult for [place] Planning/Counseling Complete Complete   Palliative Care Screening Not Applicable Not Applicable   Medication Review Press photographer) Complete Complete

## 2021-09-06 NOTE — Telephone Encounter (Signed)
Pharmacy Patient Advocate Encounter  Insurance verification completed.    The patient is insured through [employer]   The patient is currently admitted and ran test claims for the following: Eliquis.  Copays and coinsurance results were relayed to Inpatient clinical team. 

## 2021-09-06 NOTE — Progress Notes (Signed)
ANTICOAGULATION CONSULT NOTE  Pharmacy Consult for Heparin Indication: acute  pulmonary embolus  (also has h/o Afib)    Allergies  Allergen Reactions   Codeine Shortness Of Breath   Dextromethorphan-Guaifenesin Shortness Of Breath   Iron Dextran Shortness Of Breath   Morphine And Related Shortness Of Breath    Patient Measurements: Height: '5\' 9"'$  (175.3 cm) Weight: 73.5 kg (162 lb) IBW/kg (Calculated) : 70.7  Vital Signs: Temp: 97.4 F (36.3 C) (08/30 0900) Temp Source: Oral (08/30 0900) BP: 114/80 (08/30 0900) Pulse Rate: 78 (08/30 0900)  Labs: Recent Labs    09/04/21 0100 09/04/21 0454 09/04/21 1325 09/04/21 2216 09/05/21 0116 09/05/21 0817 09/05/21 0825 09/05/21 2118 09/06/21 0627 09/06/21 1106  HGB 11.6*  --   --   --   --   --  11.4*  --  10.8*  --   HCT 38.0*  --   --   --   --   --  37.2*  --  34.7*  --   PLT 66*  --   --   --   --   --  70*  --  65*  --   APTT  --   --    < > 55*  --   --  133*  --   --  73*  HEPARINUNFRC  --   --    < >  --   --    < >  --  0.96* 0.28* 0.30  CREATININE 9.38*  --   --   --   --   --  11.54*  --  7.51*  --   TROPONINIHS 72* 69*  --  80* 62*  --   --   --   --   --    < > = values in this interval not displayed.     Estimated Creatinine Clearance: 10.6 mL/min (A) (by C-G formula based on SCr of 7.51 mg/dL (H)).   Assessment: 61 y.o. male admitted with shortness of breath and PE for  heparin.  Pt has h/o Afib on Eliquis, questionable compliance.  I discussed elquis compliance with patient on 09/06/21. He reports eliquis stopped per MD during previous hospitalization June 2023.  Patient resumed Eliquis about 2 weeks prior to this admission. Last taken PTA on 09/02/21.   Today 09/06/21  '@06'$ :27 HL 0.28 on heparin 1200 ut/hr. Night RN noted patient continues to to bend wrist and occlude IV every few minutes and patient refused new IV.  Today's subtherapeutic heparin level likely down due to this.  We are having some difficulty  achieving therapeutic heparin level due to patient bending wrist /  IV occluded every few minutes last night /early AM  and restricted access for drawing heparin levels.   Current RN reports IV infusing better this AM without much interruptions.   Will continue current heparin rate and recheck heparin level later today.   Recommend to consider transitioning to Eliquis as discussed with cardiologist for acute PE and history of Afib.  Note that this patient has h/o bleeding pseudoanerysm of left AVF and chronic thrombocytopenia.    Update:   Next HL = 0.30 on heparin 1200 units/hr.  PTT= 73.  Therapeutic heparin level.  No bleeding reported.   Hgb 10.8,  pltc 65k , h/o chronic thrombocytopenia.   09/04/21 CT angio chest : + acute PE 09/05/21 LE dopplers done :  prelim results indicate negative  bilateral lower extremities.  Goal of Therapy:  Heparin  level 0.3-0.7 units/ml Monitor platelets by anticoagulation protocol: Yes   Plan:  Continue Heparin  1200 units/hr Daily HL and CBC Consider transitioning to Eliquis as soon as possible,  due to  difficulty achieving therapeutic heparin level due to IV occluded  and restricted access for drawing heparin levels.     Nicole Cella, RPh Clinical Pharmacist [phone] 09/06/2021 2:57 PM   Please check AMION for all [place] phone numbers After 10:00 PM, call [place] [phone]

## 2021-09-07 ENCOUNTER — Encounter: Payer: Self-pay | Admitting: Family Medicine

## 2021-09-07 DIAGNOSIS — Z992 Dependence on renal dialysis: Secondary | ICD-10-CM

## 2021-09-07 DIAGNOSIS — D638 Anemia in other chronic diseases classified elsewhere: Secondary | ICD-10-CM | POA: Diagnosis present

## 2021-09-07 DIAGNOSIS — E119 Type 2 diabetes mellitus without complications: Secondary | ICD-10-CM

## 2021-09-07 DIAGNOSIS — N186 End stage renal disease: Secondary | ICD-10-CM

## 2021-09-07 DIAGNOSIS — D696 Thrombocytopenia, unspecified: Secondary | ICD-10-CM | POA: Diagnosis not present

## 2021-09-07 DIAGNOSIS — I2699 Other pulmonary embolism without acute cor pulmonale: Secondary | ICD-10-CM | POA: Diagnosis present

## 2021-09-07 DIAGNOSIS — I272 Pulmonary hypertension, unspecified: Secondary | ICD-10-CM | POA: Diagnosis present

## 2021-09-07 DIAGNOSIS — I5022 Chronic systolic (congestive) heart failure: Secondary | ICD-10-CM | POA: Diagnosis present

## 2021-09-07 DIAGNOSIS — I2609 Other pulmonary embolism with acute cor pulmonale: Secondary | ICD-10-CM | POA: Diagnosis not present

## 2021-09-07 DIAGNOSIS — R778 Other specified abnormalities of plasma proteins: Secondary | ICD-10-CM | POA: Diagnosis not present

## 2021-09-07 LAB — BASIC METABOLIC PANEL
Anion gap: 11 (ref 5–15)
BUN: 47 mg/dL — ABNORMAL HIGH (ref 6–20)
CO2: 26 mmol/L (ref 22–32)
Calcium: 9.3 mg/dL (ref 8.9–10.3)
Chloride: 99 mmol/L (ref 98–111)
Creatinine, Ser: 9.39 mg/dL — ABNORMAL HIGH (ref 0.61–1.24)
GFR, Estimated: 6 mL/min — ABNORMAL LOW (ref >60)
Glucose, Bld: 114 mg/dL — ABNORMAL HIGH (ref 70–99)
Potassium: 3.9 mmol/L (ref 3.5–5.1)
Sodium: 136 mmol/L (ref 135–145)

## 2021-09-07 LAB — RENAL FUNCTION PANEL
Albumin: 2.6 g/dL — ABNORMAL LOW (ref 3.5–5.0)
Anion gap: 15 (ref 5–15)
BUN: 50 mg/dL — ABNORMAL HIGH (ref 6–20)
CO2: 26 mmol/L (ref 22–32)
Calcium: 9.6 mg/dL (ref 8.9–10.3)
Chloride: 96 mmol/L — ABNORMAL LOW (ref 98–111)
Creatinine, Ser: 9.87 mg/dL — ABNORMAL HIGH (ref 0.61–1.24)
GFR, Estimated: 6 mL/min — ABNORMAL LOW (ref >60)
Glucose, Bld: 100 mg/dL — ABNORMAL HIGH (ref 70–99)
Phosphorus: 6.9 mg/dL — ABNORMAL HIGH (ref 2.5–4.6)
Potassium: 4 mmol/L (ref 3.5–5.1)
Sodium: 137 mmol/L (ref 135–145)

## 2021-09-07 LAB — RETICULOCYTES
Immature Retic Fract: 21.1 % — ABNORMAL HIGH (ref 2.3–15.9)
RBC.: 3.69 MIL/uL — ABNORMAL LOW (ref 4.22–5.81)
Retic Count, Absolute: 84.5 10*3/uL (ref 19.0–186.0)
Retic Ct Pct: 2.3 % (ref 0.4–3.1)

## 2021-09-07 LAB — GLUCOSE, CAPILLARY
Glucose-Capillary: 144 mg/dL — ABNORMAL HIGH (ref 70–99)
Glucose-Capillary: 59 mg/dL — ABNORMAL LOW (ref 70–99)
Glucose-Capillary: 99 mg/dL (ref 70–99)
Glucose-Capillary: 121 mg/dL — ABNORMAL HIGH (ref 70–99)

## 2021-09-07 LAB — CBC WITH DIFFERENTIAL/PLATELET
Abs Immature Granulocytes: 0.01 10*3/uL (ref 0.00–0.07)
Basophils Absolute: 0.1 10*3/uL (ref 0.0–0.1)
Basophils Relative: 2 %
Eosinophils Absolute: 0.3 10*3/uL (ref 0.0–0.5)
Eosinophils Relative: 10 %
HCT: 33.9 % — ABNORMAL LOW (ref 39.0–52.0)
Hemoglobin: 10.5 g/dL — ABNORMAL LOW (ref 13.0–17.0)
Immature Granulocytes: 0 %
Lymphocytes Relative: 29 %
Lymphs Abs: 0.9 10*3/uL (ref 0.7–4.0)
MCH: 28.4 pg (ref 26.0–34.0)
MCHC: 31 g/dL (ref 30.0–36.0)
MCV: 91.6 fL (ref 80.0–100.0)
Monocytes Absolute: 0.3 10*3/uL (ref 0.1–1.0)
Monocytes Relative: 10 %
Neutro Abs: 1.5 10*3/uL — ABNORMAL LOW (ref 1.7–7.7)
Neutrophils Relative %: 49 %
Platelets: 67 10*3/uL — ABNORMAL LOW (ref 150–400)
RBC: 3.7 MIL/uL — ABNORMAL LOW (ref 4.22–5.81)
RDW: 17.8 % — ABNORMAL HIGH (ref 11.5–15.5)
WBC: 3 10*3/uL — ABNORMAL LOW (ref 4.0–10.5)
nRBC: 0 % (ref 0.0–0.2)

## 2021-09-07 LAB — LIPID PANEL
Cholesterol: 97 mg/dL (ref 0–200)
HDL: 51 mg/dL (ref >40)
LDL Cholesterol: 37 mg/dL (ref 0–99)
Total CHOL/HDL Ratio: 1.9 RATIO
Triglycerides: 44 mg/dL (ref <150)
VLDL: 9 mg/dL (ref 0–40)

## 2021-09-07 LAB — IRON AND TIBC
Iron: 39 ug/dL — ABNORMAL LOW (ref 45–182)
Saturation Ratios: 21 % (ref 17.9–39.5)
TIBC: 190 ug/dL — ABNORMAL LOW (ref 250–450)
UIBC: 151 ug/dL

## 2021-09-07 LAB — FERRITIN: Ferritin: 536 ng/mL — ABNORMAL HIGH (ref 24–336)

## 2021-09-07 LAB — FOLATE: Folate: 5.3 ng/mL — ABNORMAL LOW (ref >5.9)

## 2021-09-07 LAB — MAGNESIUM: Magnesium: 2 mg/dL (ref 1.7–2.4)

## 2021-09-07 LAB — HEPARIN LEVEL (UNFRACTIONATED): Heparin Unfractionated: 0.35 IU/mL (ref 0.30–0.70)

## 2021-09-07 LAB — HEPATITIS B SURFACE ANTIBODY, QUANTITATIVE: Hep B S AB Quant (Post): 658 m[IU]/mL (ref >9.9)

## 2021-09-07 LAB — VITAMIN B12: Vitamin B-12: 778 pg/mL (ref 180–914)

## 2021-09-07 LAB — PHOSPHORUS: Phosphorus: 6.7 mg/dL — ABNORMAL HIGH (ref 2.5–4.6)

## 2021-09-07 MED ORDER — ANTICOAGULANT SODIUM CITRATE 4% (200MG/5ML) IV SOLN
5.0000 mL | Status: DC | PRN
Start: 2021-09-07 — End: 2021-09-07
  Administered 2021-09-07: 3.8 mL
  Filled 2021-09-07 (×2): qty 5

## 2021-09-07 MED ORDER — APIXABAN 5 MG PO TABS: 5.0000 mg | ORAL_TABLET | Freq: Two times a day (BID) | ORAL | Status: DC

## 2021-09-07 MED ORDER — PENTAFLUOROPROP-TETRAFLUOROETH EX AERO
1.0000 | INHALATION_SPRAY | CUTANEOUS | Status: DC | PRN
Start: 2021-09-07 — End: 2021-09-07

## 2021-09-07 MED ORDER — LIDOCAINE-PRILOCAINE 2.5-2.5 % EX CREA
1.0000 | TOPICAL_CREAM | CUTANEOUS | Status: DC | PRN
Start: 2021-09-07 — End: 2021-09-07
  Filled 2021-09-07: qty 5

## 2021-09-07 MED ORDER — APIXABAN 5 MG PO TABS: 10.0000 mg | ORAL_TABLET | Freq: Two times a day (BID) | ORAL | Status: DC

## 2021-09-07 MED ORDER — APIXABAN 5 MG PO TABS
10.0000 mg | ORAL_TABLET | Freq: Two times a day (BID) | ORAL | Status: DC
Start: 2021-09-07 — End: 2021-09-08
  Administered 2021-09-07 – 2021-09-08 (×3): 10 mg via ORAL
  Filled 2021-09-07 (×5): qty 2

## 2021-09-07 MED ORDER — ALTEPLASE 2 MG IJ SOLR
2.0000 mg | Freq: Once | INTRAMUSCULAR | Status: DC | PRN
Start: 2021-09-07 — End: 2021-09-07
  Filled 2021-09-07: qty 2

## 2021-09-07 MED ORDER — LIDOCAINE HCL (PF) 1 % IJ SOLN
5.0000 mL | INTRAMUSCULAR | Status: DC | PRN
Start: 2021-09-07 — End: 2021-09-07

## 2021-09-07 NOTE — Discharge Instructions (Signed)
Information on my medicine - ELIQUIS (apixaban)  This medication education was reviewed with me or my healthcare representative as part of my discharge preparation.   Why was Eliquis prescribed for you? Eliquis was prescribed to treat blood clots that may have been found in the veins of your legs (deep vein thrombosis) or in your lungs (pulmonary embolism) and to reduce the risk of them occurring again.  What do You need to know about Eliquis ? The starting dose is 10 mg (two 5 mg tablets) taken TWICE daily for the FIRST SEVEN (7) DAYS, then on September 14, 2021  the dose is reduced to ONE 5 mg tablet taken TWICE daily.  Eliquis may be taken with or without food.   Try to take the dose about the same time in the morning and in the evening. If you have difficulty swallowing the tablet whole please discuss with your pharmacist how to take the medication safely.  Take Eliquis exactly as prescribed and DO NOT stop taking Eliquis without talking to the doctor who prescribed the medication.  Stopping may increase your risk of developing a new blood clot.  Refill your prescription before you run out.  After discharge, you should have regular check-up appointments with your healthcare provider that is prescribing your Eliquis.    What do you do if you miss a dose? If a dose of ELIQUIS is not taken at the scheduled time, take it as soon as possible on the same day and twice-daily administration should be resumed. The dose should not be doubled to make up for a missed dose.  Important Safety Information A possible side effect of Eliquis is bleeding. You should call your healthcare provider right away if you experience any of the following: Bleeding from an injury or your nose that does not stop. Unusual colored urine (red or dark brown) or unusual colored stools (red or black). Unusual bruising for unknown reasons. A serious fall or if you hit your head (even if there is no bleeding).  Some  medicines may interact with Eliquis and might increase your risk of bleeding or clotting while on Eliquis. To help avoid this, consult your healthcare provider or pharmacist prior to using any new prescription or non-prescription medications, including herbals, vitamins, non-steroidal anti-inflammatory drugs (NSAIDs) and supplements.  This website has more information on Eliquis (apixaban): http://www.eliquis.com/eliquis/home

## 2021-09-07 NOTE — Progress Notes (Signed)
Received patient in bed to unit. Alert and oriented. Informed consent signed and in chart.   Treatment initiated: 0829 Treatment completed: 1235  Patient tolerated well. UF turned back on at approx. 1200 after patient complaints of crampting. Patient remains alert, oriented, with out signs of acute distress. Report give to patient's floor nurse.Transported back to the room   Access used:Left Subclavian HD Catheter Access issues: none  Total UF removed: 2L Medication(s) given: Eliquis Post HD VS: 98/68 78 98 % on 3L 20 97.8 Post HD weight: 73.1   Hala Narula [facility]

## 2021-09-07 NOTE — Progress Notes (Signed)
[place] for Elquis (apixaban)  Indication: acute  pulmonary embolus  , with history of Afib.    Allergies  Allergen Reactions   Codeine Shortness Of Breath   Dextromethorphan-Guaifenesin Shortness Of Breath   Iron Dextran Shortness Of Breath   Morphine And Related Shortness Of Breath    Patient Measurements: Height: '5\' 9"'$  (175.3 cm) Weight: 74.2 kg (163 lb 9.3 oz) IBW/kg (Calculated) : 70.7  Vital Signs: Temp: 97.7 F (36.5 C) (08/31 0045) Temp Source: Oral (08/31 0045) BP: 105/76 (08/31 0735) Pulse Rate: 72 (08/31 0045)  Labs: Recent Labs    09/04/21 2216 09/05/21 0116 09/05/21 0817 09/05/21 0825 09/05/21 2118 09/06/21 0627 09/06/21 1106 09/07/21 0329  HGB  --   --   --  11.4*  --  10.8*  --   --   HCT  --   --   --  37.2*  --  34.7*  --   --   PLT  --   --   --  70*  --  65*  --   --   APTT 55*  --   --  133*  --   --  73*  --   HEPARINUNFRC  --   --    < >  --    < > 0.28* 0.30 0.35  CREATININE  --   --   --  11.54*  --  7.51*  --  9.39*  TROPONINIHS 80* 62*  --   --   --   --   --   --    < > = values in this interval not displayed.     Estimated Creatinine Clearance: 8.5 mL/min (A) (by C-G formula based on SCr of 9.39 mg/dL (H)).   Assessment: 61 y.o. male admitted with shortness of breath and PE for  heparin.  Pt has h/o Afib on Eliquis, questionable compliance.  I discussed elquis compliance with patient on 09/06/21. He reports eliquis stopped per MD during previous hospitalization June 2023.  Patient resumed Eliquis about 2 weeks prior to this admission. Last taken PTA on 09/02/21.   Today 09/06/21  '@06'$ :27 HL 0.28 on heparin 1200 ut/hr. Night RN noted patient continues to to bend wrist and occlude IV every few minutes and patient refused new IV.  Today's subtherapeutic heparin level likely down due to this.  We are having some difficulty achieving therapeutic heparin level due to patient bending wrist /  IV occluded  every few minutes last night /early AM  and restricted access for drawing heparin levels.   Current RN reports IV infusing better this AM without much interruptions.   Will continue current heparin rate and recheck heparin level later today.   Recommend to consider transitioning to Eliquis as discussed with cardiologist for acute PE and history of Afib.  Note that this patient has h/o bleeding pseudoanerysm of left AVF and chronic thrombocytopenia.    09/07/21   HL therapeutic this morning on heparin 1200 units/hr.  Now pharmacy consulted to transition to Eliquis for acute PE dosing. He was  previously taking Eliquis low dose PTA for h/o Afib, although patient had not taken for last month at least, although he reported to me that he resumed eliquis about 2 weeks prior to this admission.   Had been held on prior hospitalization due to AVF bleeding and chronic thrombocytopenia.  No bleeding reported while on IV heparin here.    Hgb 10.5,  pltc 67k  stable,  h/o chronic thrombocytopenia. Eliquis dosing confirmed with Dr. Sherral Hammers will be VTE treatment dosing for acute PE.    09/04/21 CT angio chest : + acute PE 09/05/21 LE dopplers done :  prelim results indicate negative  bilateral lower extremities.   Plan:  Heparin discontinued.  Start Eliquis '10mg'$  bid x7 days then 5 mg bid Monitor for s/sx of bleeding     Nicole Cella, [place] Clinical Pharmacist [phone] 8/31/20237:51 AM   Please check AMION for all [place] phone numbers After 10:00 PM, call [place] [phone]

## 2021-09-07 NOTE — Progress Notes (Signed)
Nathaniel White SVX:[id number] DOB: 22-Feb-1960 DOA: 09/04/2021 PCP: Gildardo Pounds, NP   Subj: 61 year old BM PMHx ESRD on HD T/TH/SAT, nonischemic cardiomyopathy with EF 35%, severe pulmonary HTN with RV failure, paroxysmal A-fib, prior history of CVA with residual left-sided weakness, recent history of necrotizing pneumonia, noncompliance with anticoagulation.    Comes to the hospital with progressive shortness of breath for 1 month.  He has been prescribed Eliquis but has not taken it in the last month.  In the ED was noted to have brief run of nonsustained V. tach.  CTA chest was positive for PE and started on heparin drip.  Troponin mildly elevated, BNP elevated   Obj: 8/31 afebrile overnight    Objective: VITAL SIGNS: Temp: 97.7 F (36.5 C) (08/31 0045) Temp Source: Oral (08/31 0045) BP: 100/69 (08/31 0045) Pulse Rate: 72 (08/31 0045) SPO2; 98%  FIO2: 3 L O2 via [place]   Intake/Output Summary (Last 24 hours) at 09/07/2021 2330 Last data filed at 09/07/2021 0600 Gross per 24 hour  Intake 480 ml  Output 0 ml  Net 480 ml     Exam: General: No acute respiratory distress Lungs: Clear to auscultation bilaterally without wheezes or crackles Cardiovascular: Regular rate and rhythm without murmur gallop or rub normal S1 and S2 Abdomen: Nontender, nondistended, soft, bowel sounds positive, no rebound, no ascites, no appreciable mass Extremities: No significant cyanosis, clubbing, or edema bilateral lower extremities Skin: Negative rashes, lesions, ulcers Psychiatric:  Negative depression, negative anxiety, negative fatigue, negative mania  Central nervous system:  Cranial nerves II through XII intact, tongue/uvula midline, all extremities muscle strength 5/5, sensation intact throughout, negative dysarthria, negative expressive aphasia, negative receptive aphasia.    Mobility Assessment (last 72 hours)     Mobility Assessment     Row Name 09/06/21 2124 09/06/21 0753 09/05/21  1827 09/04/21 2050     Does patient have an order for bedrest or is patient medically unstable No - Continue assessment No - Continue assessment No - Continue assessment No - Continue assessment    What is the highest level of mobility based on the progressive mobility assessment? Level 3 (Stands with assist) - Balance while standing  and cannot march in place Level 3 (Stands with assist) - Balance while standing  and cannot march in place Level 3 (Stands with assist) - Balance while standing  and cannot march in place Level 5 (Walks with assist in room/hall) - Balance while stepping forward/back and can walk in room with assist - Complete    Is the above level different from baseline mobility prior to current illness? Yes - Recommend PT order -- -- --               DVT prophylaxis: Eliquis Code Status: Full Family Communication:  Status is: Inpatient    Dispo: The patient is from: Home              Anticipated d/c is to: Home              Anticipated d/c date is: 2 days              Patient currently is not medically stable to d/c.   8/29 echocardiogram -Left Ventricle: Left ventricular ejection fraction, by estimation, is 30  to 35%. The left ventricle has moderately decreased function. The left  ventricle demonstrates global hypokinesis. Definity contrast agent was  given IV to delineate the left  ventricular endocardial borders. The left ventricular internal cavity size  was normal in size. There is severe concentric left ventricular  hypertrophy. Left ventricular diastolic parameters are indeterminate.   Right Ventricle: The right ventricular size is severely enlarged. No  increase in right ventricular wall thickness. Right ventricular systolic  function is moderately reduced. There is severely elevated pulmonary  artery systolic pressure. The tricuspid  regurgitant velocity is 3.69 m/s, and with an assumed right atrial  pressure of 8 mmHg, the estimated right  ventricular systolic pressure is  90.2 mmHg.   Left Atrium: Left atrial size was severely dilated.    Mitral Valve: Regurgitant volume 42 ml, No right sided pulmonary vein  systolic blunting. There is a mixed etiology of mitral regurgitation:  posterior restriction and functional MR. Calcified mitral valve with at  least mild mitral regurgitation. The  mitral valve is degenerative in appearance. Moderate mitral annular  calcification. Moderate to severe mitral valve regurgitation. Mild mitral  valve stenosis. The mean mitral valve gradient is 5.0 mmHg with average  heart rate of 79 bpm.   Tricuspid Valve: The tricuspid valve is normal in structure. Tricuspid  valve regurgitation is moderate to severe.   Aortic Valve: The aortic valve is tricuspid. There is mild calcification  of the aortic valve. There is mild thickening of the aortic valve. There  is moderate aortic valve annular calcification. Aortic valve regurgitation  is not visualized. Aortic valve  sclerosis is present, with no evidence of aortic valve stenosis. Aortic  valve mean gradient measures 2.0 mmHg. Aortic valve peak gradient measures  4.5 mmHg. Aortic valve area, by VTI measures 1.47 cm.    Consultants:  Cardiology Nephrology   Cultures   Antimicrobials:   A/P  Acute right lower lobe pulmonary embolism - Admit patient to the hospital.  Has elevated BNP and troponin levels.  Lower extremity Dopplers negative for DVT.   -Currently on heparin drip.   -8/31 given that patient had been off Eliquis for a full month decided to treat patient as if he had never started Eliquis.  Start patient on Eliquis -8/31 obtain ambulatory SPO2 place findings in appropriate epic note for insurance purposes, inform physician when complete.    Chronic systolic CHF - Strict in and out -715.18m - Daily weight Filed Weights   09/06/21 0019 09/06/21 0404 09/07/21 0500  Weight: 74.5 kg 73.5 kg 74.2 kg  - 8/30 no significant  change on echocardiogram 8/29 when compared to echocardiogram 3/6 - 8/31 cardiology concurs with not placing patient on guideline directed medical therapy (GDMT) for CHF secondary to chronic hypotension and ESRD  Severe pulmonary HTN - See CHF  History of paroxysmal atrial fibrillation - Not on any rate control medication.   -Was prescribed Eliquis but has not taken it in 1 month. -See acute PE, and CHF -8/31 D/C heparin drip restart Eliquis   Brief episode of NSVT - Echo done as mentioned above monitor electrolytes and replete.  Admitting provider notified cardiology recommended observation for now.  He had similar episodes back in June when he was admitted for necrotizing pneumonia. -Cardiology on board concurs with not adding beta-blocker secondary to patient's hypotension.  Concurs with maximizing electrolytes.  Chronic hypotension - Midodrine 3 times daily   ESRD on  HD T/TH/SAT -HD per nephrology     Diabetes mellitus type 2 - Does not appear to be any medication.  -8/29 hemoglobin A1c= 5.2 - No need to start medication at this time well-controlled - 8/31 LDL= 37 at goal   Anemia of chronic disease  and thrombocytopenia -8/30 anemia panel pending    Hx CVA with residual left-sided weakness        Care during the described time interval was provided by me .  I have reviewed this patient's available data, including medical history, events of note, physical examination, and all test results as part of my evaluation.

## 2021-09-07 NOTE — Progress Notes (Signed)
[facility] Progress Note   Outpatient Dialysis Orders:  TueThuSat, 4 hrs 0 min, 180NRe Optiflux, BFR 450, DFR Manual 500 mL/min, EDW 73.5 (kg), Dialysate 2.0 K, 2.0 Ca TDC No heparin Parsabiv 7.'5mg'$  IV q HD  Assessment/Plan:  Pulmonary embolism: On heparin drip, management per primary team  ESRD:  Dialysis on TTS schedule Seen on dialysis  2L LIJ TC  3K bath 108/77 and tolerating treatment.   Hypertension/volume: BP controlled. Does take midodrine three times daily, will order an extra dose with HD as needed. UF to EDW as tolerated.   Anemia: Hb 10.8. Not on ESA outpatient, none indicated at this time.   Metabolic bone disease: Last calcium elevated. Not on VDRA. Unable to give parsabiv here, avoid meds containing calcium. Phos slightly elevated, restarted on velphoro  Nutrition:  Continue renal diet, adding protein supplements A.fib: previously on eliquis but reportedly no longer taking. RRR today, rate controlled.   Subjective:   Pt seen on HD; still on O2 [place]. He denies SOB, CP, dizziness, abdominal pain and nausea. .   Objective Vitals:   09/07/21 0829 09/07/21 0830 09/07/21 0900 09/07/21 0930  BP: 104/61 103/69 108/77 101/60  Pulse: 66 71 (!) 25 (!) 34  Resp: 16 15 (!) 23 17  Temp:      TempSrc:      SpO2: 100% 98% 97% 100%  Weight:      Height:       Physical Exam General: Alert male in NAD Heart: RRR, no murmurs, rubs or gallops Lungs: CTA bilaterally without wheezing, rhonchi or rales Abdomen: Soft, non-distended, +BS Extremities: No edema b/l lower extremities Dialysis Access:  [facility] on machine  Additional Objective Labs: Basic Metabolic Panel: Recent Labs  Lab 09/06/21 0627 09/07/21 0329 09/07/21 0823  NA 136 136 137  K 3.5 3.9 4.0  CL 97* 99 96*  CO2 '25 26 26  '$ GLUCOSE 110* 114* 100*  BUN 34* 47* 50*  CREATININE 7.51* 9.39* 9.87*  CALCIUM 9.5 9.3 9.6  PHOS 5.8* 6.7* 6.9*   Liver Function Tests: Recent Labs  Lab 09/04/21 0100  09/05/21 0825 09/06/21 0627 09/07/21 0823  AST '19 18 15  '$ --   ALT '9 9 8  '$ --   ALKPHOS 74 66 63  --   BILITOT 1.0 0.9 0.7  --   PROT 8.0 7.7 7.0  --   ALBUMIN 3.2* 2.9* 2.7* 2.6*   No results for input(s): "LIPASE", "AMYLASE" in the last 168 hours. CBC: Recent Labs  Lab 09/04/21 0100 09/05/21 0825 09/06/21 0627 09/07/21 0329  WBC 4.1 4.1 3.7* 3.0*  NEUTROABS 1.9  --  2.0 1.5*  HGB 11.6* 11.4* 10.8* 10.5*  HCT 38.0* 37.2* 34.7* 33.9*  MCV 91.8 91.4 91.6 91.6  PLT 66* 70* 65* 67*   Blood Culture    Component Value Date/Time   SDES TRACHEAL ASPIRATE 06/21/2021 1721   SPECREQUEST NONE 06/21/2021 1721   CULT  06/21/2021 1721    RARE Normal respiratory flora-no Staph aureus or Pseudomonas seen Performed at [facility] [address] [place], [place] [address]    REPTSTATUS 06/24/2021 FINAL 06/21/2021 1721    Cardiac Enzymes: No results for input(s): "CKTOTAL", "CKMB", "CKMBINDEX", "TROPONINI" in the last 168 hours. CBG: Recent Labs  Lab 09/06/21 0639 09/06/21 1142 09/06/21 1620 09/06/21 2138 09/07/21 0620  GLUCAP 116* 100* 117* 109* 144*   Iron Studies:  Recent Labs    09/07/21 0329  IRON 39*  TIBC 190*  FERRITIN  536*   '@lablastinr3'$ @ Studies/Results: VAS [place] LOWER EXTREMITY VENOUS (DVT)  Result Date: 09/05/2021  Lower Venous DVT Study Patient Name:  Nathaniel White  Date of Exam:   09/05/2021 Medical Rec #: [id number]        Accession #:    [id number] Date of Birth: [birth year]        Patient Gender: M Patient Age:   61 years Exam Location:  [facility] Procedure:      VAS [place] LOWER EXTREMITY VENOUS (DVT) Referring Phys: Gean Birchwood --------------------------------------------------------------------------------  Indications: Pulmonary embolism.  Limitations: Poor ultrasound/tissue interface. Comparison Study: no prior Performing Technologist: Archie Patten RVS  Examination Guidelines: A complete evaluation includes B-mode imaging, spectral  Doppler, color Doppler, and power Doppler as needed of all accessible portions of each vessel. Bilateral testing is considered an integral part of a complete examination. Limited examinations for reoccurring indications may be performed as noted. The reflux portion of the exam is performed with the patient in reverse Trendelenburg.  +--------+---------------+---------+-----------+----------+--------------------+ RIGHT   CompressibilityPhasicitySpontaneityPropertiesThrombus Aging       +--------+---------------+---------+-----------+----------+--------------------+ CFV     Full           Yes      Yes                                       +--------+---------------+---------+-----------+----------+--------------------+ SFJ     Full                                                              +--------+---------------+---------+-----------+----------+--------------------+ FV Prox Full                                                              +--------+---------------+---------+-----------+----------+--------------------+ FV Mid  Full                                                              +--------+---------------+---------+-----------+----------+--------------------+ FV      Full                                                              Distal                                                                    +--------+---------------+---------+-----------+----------+--------------------+ PFV     Full                                                              +--------+---------------+---------+-----------+----------+--------------------+  POP     Full           Yes      Yes                                       +--------+---------------+---------+-----------+----------+--------------------+ PTV                    Yes      Yes                  patent by color                                                           doppler               +--------+---------------+---------+-----------+----------+--------------------+ PERO                                                 Not well visualized  +--------+---------------+---------+-----------+----------+--------------------+   +--------+---------------+---------+-----------+----------+--------------------+ LEFT    CompressibilityPhasicitySpontaneityPropertiesThrombus Aging       +--------+---------------+---------+-----------+----------+--------------------+ CFV     Full           Yes      Yes                                       +--------+---------------+---------+-----------+----------+--------------------+ SFJ     Full                                                              +--------+---------------+---------+-----------+----------+--------------------+ FV Prox Full                                                              +--------+---------------+---------+-----------+----------+--------------------+ FV Mid  Full                                                              +--------+---------------+---------+-----------+----------+--------------------+ FV      Full                                                              Distal                                                                    +--------+---------------+---------+-----------+----------+--------------------+  PFV     Full                                                              +--------+---------------+---------+-----------+----------+--------------------+ POP     Full           Yes      Yes                                       +--------+---------------+---------+-----------+----------+--------------------+ PTV                    Yes      Yes                  patent by color                                                           doppler               +--------+---------------+---------+-----------+----------+--------------------+ PERO                                                 Not well visualized  +--------+---------------+---------+-----------+----------+--------------------+     Summary: BILATERAL: - No evidence of deep vein thrombosis seen in the lower extremities, bilaterally. -No evidence of popliteal cyst, bilaterally.   *See table(s) above for measurements and observations. Electronically signed by Deitra Mayo MD on 09/05/2021 at 4:52:51 PM.    Final    Medications:  anticoagulant sodium citrate      apixaban  10 mg Oral BID   Followed by   Derrill Memo ON 09/14/2021] apixaban  5 mg Oral BID   Chlorhexidine Gluconate Cloth  6 each Topical Daily   Chlorhexidine Gluconate Cloth  6 each Topical Q0600   Chlorhexidine Gluconate Cloth  6 each Topical Q0600   insulin aspart  0-6 Units Subcutaneous TID WC   midodrine  5 mg Oral TID WC   sucroferric oxyhydroxide  1,000 mg Oral TID WC

## 2021-09-07 NOTE — Progress Notes (Signed)
Rounding Note    Patient Name: Nathaniel White Date of Encounter: 09/07/2021  [place] Cardiologist: Lauree Chandler, MD   Subjective   Breathing same. No chest pain.   Inpatient Medications    Scheduled Meds:  apixaban  10 mg Oral BID   Followed by   Derrill Memo ON 09/14/2021] apixaban  5 mg Oral BID   Chlorhexidine Gluconate Cloth  6 each Topical Daily   Chlorhexidine Gluconate Cloth  6 each Topical Q0600   Chlorhexidine Gluconate Cloth  6 each Topical Q0600   insulin aspart  0-6 Units Subcutaneous TID WC   midodrine  5 mg Oral TID WC   sucroferric oxyhydroxide  1,000 mg Oral TID WC   Continuous Infusions:  anticoagulant sodium citrate     PRN Meds: acetaminophen **OR** acetaminophen, alteplase, anticoagulant sodium citrate, guaiFENesin, hydrALAZINE, ipratropium-albuterol, lidocaine (PF), lidocaine-prilocaine, metoprolol tartrate, pentafluoroprop-tetrafluoroeth, senna-docusate, traZODone   Vital Signs    Vitals:   09/07/21 0829 09/07/21 0830 09/07/21 0900 09/07/21 0930  BP: 104/61 103/69 108/77 101/60  Pulse: 66 71 (!) 25 (!) 34  Resp: 16 15 (!) 23 17  Temp:      TempSrc:      SpO2: 100% 98% 97% 100%  Weight:      Height:        Intake/Output Summary (Last 24 hours) at 09/07/2021 0946 Last data filed at 09/07/2021 0600 Gross per 24 hour  Intake 480 ml  Output 0 ml  Net 480 ml       09/07/2021    5:00 AM 09/06/2021    4:04 AM 09/06/2021   12:19 AM  Last 3 Weights  Weight (lbs) 163 lb 9.3 oz 162 lb 164 lb 3.9 oz  Weight (kg) 74.2 kg 73.483 kg 74.5 kg      Telemetry    SR, PVCs, ventricular trigeminy - Personally Reviewed  ECG    N/A  Physical Exam   GEN: ill appearing male in no acute distress.   Neck: No JVD Cardiac: irrregular, + murmurs, rubs, or gallops.  Respiratory: Clear to auscultation bilaterally. GI: Soft, nontender, non-distended  MS: No edema; No deformity. Neuro:  Nonfocal  Psych: Normal affect   Labs    High  Sensitivity Troponin:   Recent Labs  Lab 09/04/21 0100 09/04/21 0454 09/04/21 2216 09/05/21 0116  TROPONINIHS 72* 69* 80* 62*      Chemistry Recent Labs  Lab 09/04/21 0100 09/05/21 0825 09/06/21 0627 09/07/21 0329 09/07/21 0823  NA 137 135 136 136 137  K 4.4 4.8 3.5 3.9 4.0  CL 96* 97* 97* 99 96*  CO2 '26 23 25 26 26  '$ GLUCOSE 96 92 110* 114* 100*  BUN 45* 54* 34* 47* 50*  CREATININE 9.38* 11.54* 7.51* 9.39* 9.87*  CALCIUM 9.8 9.6 9.5 9.3 9.6  MG  --   --  1.9 2.0  --   PROT 8.0 7.7 7.0  --   --   ALBUMIN 3.2* 2.9* 2.7*  --  2.6*  AST '19 18 15  '$ --   --   ALT '9 9 8  '$ --   --   ALKPHOS 74 66 63  --   --   BILITOT 1.0 0.9 0.7  --   --   GFRNONAA 6* 5* 8* 6* 6*  ANIONGAP '15 15 14 11 15     '$ Lipids  Recent Labs  Lab 09/07/21 0329  CHOL 97  TRIG 44  HDL 51  LDLCALC 37  CHOLHDL 1.9  Hematology Recent Labs  Lab 09/05/21 0825 09/06/21 0627 09/07/21 0329  WBC 4.1 3.7* 3.0*  RBC 4.07* 3.79* 3.70*  3.69*  HGB 11.4* 10.8* 10.5*  HCT 37.2* 34.7* 33.9*  MCV 91.4 91.6 91.6  MCH 28.0 28.5 28.4  MCHC 30.6 31.1 31.0  RDW 18.0* 18.0* 17.8*  PLT 70* 65* 67*    Thyroid No results for input(s): "TSH", "FREET4" in the last 168 hours.  BNP Recent Labs  Lab 09/04/21 0100  BNP 2,911.4*     DDimer No results for input(s): "DDIMER" in the last 168 hours.   Radiology    VAS [place] LOWER EXTREMITY VENOUS (DVT)  Result Date: 09/05/2021  Lower Venous DVT Study Patient Name:  Nathaniel White  Date of Exam:   09/05/2021 Medical Rec #: [id number]        Accession #:    [id number] Date of Birth: 08-Jul-1960        Patient Gender: M Patient Age:   61 years Exam Location:  [facility] Procedure:      VAS [place] LOWER EXTREMITY VENOUS (DVT) Referring Phys: Gean Birchwood --------------------------------------------------------------------------------  Indications: Pulmonary embolism.  Limitations: Poor ultrasound/tissue interface. Comparison Study: no prior Performing  Technologist: Archie Patten RVS  Examination Guidelines: A complete evaluation includes B-mode imaging, spectral Doppler, color Doppler, and power Doppler as needed of all accessible portions of each vessel. Bilateral testing is considered an integral part of a complete examination. Limited examinations for reoccurring indications may be performed as noted. The reflux portion of the exam is performed with the patient in reverse Trendelenburg.  +--------+---------------+---------+-----------+----------+--------------------+ RIGHT   CompressibilityPhasicitySpontaneityPropertiesThrombus Aging       +--------+---------------+---------+-----------+----------+--------------------+ CFV     Full           Yes      Yes                                       +--------+---------------+---------+-----------+----------+--------------------+ SFJ     Full                                                              +--------+---------------+---------+-----------+----------+--------------------+ FV Prox Full                                                              +--------+---------------+---------+-----------+----------+--------------------+ FV Mid  Full                                                              +--------+---------------+---------+-----------+----------+--------------------+ FV      Full  Distal                                                                    +--------+---------------+---------+-----------+----------+--------------------+ PFV     Full                                                              +--------+---------------+---------+-----------+----------+--------------------+ POP     Full           Yes      Yes                                       +--------+---------------+---------+-----------+----------+--------------------+ PTV                    Yes      Yes                   patent by color                                                           doppler              +--------+---------------+---------+-----------+----------+--------------------+ PERO                                                 Not well visualized  +--------+---------------+---------+-----------+----------+--------------------+   +--------+---------------+---------+-----------+----------+--------------------+ LEFT    CompressibilityPhasicitySpontaneityPropertiesThrombus Aging       +--------+---------------+---------+-----------+----------+--------------------+ CFV     Full           Yes      Yes                                       +--------+---------------+---------+-----------+----------+--------------------+ SFJ     Full                                                              +--------+---------------+---------+-----------+----------+--------------------+ FV Prox Full                                                              +--------+---------------+---------+-----------+----------+--------------------+ FV Mid  Full                                                              +--------+---------------+---------+-----------+----------+--------------------+  FV      Full                                                              Distal                                                                    +--------+---------------+---------+-----------+----------+--------------------+ PFV     Full                                                              +--------+---------------+---------+-----------+----------+--------------------+ POP     Full           Yes      Yes                                       +--------+---------------+---------+-----------+----------+--------------------+ PTV                    Yes      Yes                  patent by color                                                            doppler              +--------+---------------+---------+-----------+----------+--------------------+ PERO                                                 Not well visualized  +--------+---------------+---------+-----------+----------+--------------------+     Summary: BILATERAL: - No evidence of deep vein thrombosis seen in the lower extremities, bilaterally. -No evidence of popliteal cyst, bilaterally.   *See table(s) above for measurements and observations. Electronically signed by Deitra Mayo MD on 09/05/2021 at 4:52:51 PM.    Final     Cardiac Studies   Echo 09/05/21  1. Left ventricular ejection fraction, by estimation, is 30 to 35%. The  left ventricle has moderately decreased function. The left ventricle  demonstrates global hypokinesis. There is severe concentric left  ventricular hypertrophy. Left ventricular  diastolic parameters are indeterminate.   2. Right ventricular systolic function is moderately reduced. The right  ventricular size is severely enlarged. There is severely elevated  pulmonary artery systolic pressure. The estimated right ventricular  systolic pressure is 36.6 mmHg.   3. Left atrial size was severely dilated.   4. Right atrial size was mildly dilated.  5. Regurgitant volume 42 ml, No right sided pulmonary vein systolic  blunting. There is a mixed etiology of mitral regurgitation: posterior  restriction and functional MR. Calcified mitral valve with at least mild  mitral regurgitation. The mitral valve is   degenerative. Moderate to severe mitral valve regurgitation. Mild mitral  stenosis. The mean mitral valve gradient is 5.0 mmHg with average heart  rate of 79 bpm. Moderate mitral annular calcification.   6. Tricuspid valve regurgitation is moderate to severe.   7. The aortic valve is tricuspid. There is mild calcification of the  aortic valve. There is mild thickening of the aortic valve. Aortic valve  regurgitation is not  visualized. Aortic valve sclerosis is present, with  no evidence of aortic valve stenosis.   Comparison(s): Prior images reviewed side by side. Increase in mitral  valve and tricuspid valve regurgitation from prior. When patient is at dry  weight, consideration of TEE for characterization of MR/MS is reasonable  (inpatient vs outpatient).   Patient Profile     61 y.o. male with a hx of ESRD on HD, nonischemic cardiomyopathy (EF 35% in March 2023), pulmonary hypertension, RV failure, paroxysmal atrial fibrillation, prior stroke with left-sided weakness, PAD seen for NSVT/PVCs in setting of acute PE.   Assessment & Plan    Pulmonary embolus: is on the high dose of Eliquis for PE . Will lower the dose after 7 days   Frequent PVCs - Frequency of NSVT improved. Continues to have frequent PVCs and ventricular trigeminy.  - Unable to add BB due to chronic hypotension requiring midodrine  - Keep K > 4 and Mg > 2 - Felt due to pulmonary edema   3. PAF - Rate stable. Is back on Eliquis   4. Chronic systolic CHF - Not on GDMT due to chronic hypotension and ESRD - Volume managed by dialysis  5. ESRD on HD  For questions or updates, please contact [place] Please consult [email or web address] for contact info under      Mertie Moores, MD  09/07/2021 9:52 AM    [facility] [address],  [place], [place]  [address] Phone: [phone]; Fax: [phone]

## 2021-09-07 NOTE — Progress Notes (Signed)
UF turned off due to patient complaints of cramping. Vitals are stable. Will continue to monitor for signs of discomfort and turn UF back on if appropriate.

## 2021-09-07 NOTE — Plan of Care (Signed)

## 2021-09-07 NOTE — Progress Notes (Signed)
Mobility Specialist Progress Note:   09/07/21 1510  Mobility  Activity Transferred to/from [facility]  Level of Assistance Contact guard assist, steadying assist  Assistive Device  (HHA)  Distance Ambulated (ft) 2 ft  Activity Response Tolerated well  $Mobility charge 1 Mobility   Pt received in bed asking to use BSC. No complaints of pain. Left on BSC and was instructed to hit call button when finished, NT aware.   [facility] Nathaniel White Mobility Specialist

## 2021-09-08 ENCOUNTER — Ambulatory Visit: Payer: Medicare Other | Admitting: Nurse Practitioner

## 2021-09-08 ENCOUNTER — Other Ambulatory Visit (HOSPITAL_COMMUNITY): Payer: Self-pay

## 2021-09-08 DIAGNOSIS — E119 Type 2 diabetes mellitus without complications: Secondary | ICD-10-CM | POA: Diagnosis not present

## 2021-09-08 DIAGNOSIS — R0602 Shortness of breath: Secondary | ICD-10-CM

## 2021-09-08 DIAGNOSIS — D638 Anemia in other chronic diseases classified elsewhere: Secondary | ICD-10-CM

## 2021-09-08 DIAGNOSIS — I272 Pulmonary hypertension, unspecified: Secondary | ICD-10-CM

## 2021-09-08 DIAGNOSIS — I5022 Chronic systolic (congestive) heart failure: Secondary | ICD-10-CM

## 2021-09-08 DIAGNOSIS — D696 Thrombocytopenia, unspecified: Secondary | ICD-10-CM | POA: Diagnosis not present

## 2021-09-08 LAB — CBC WITH DIFFERENTIAL/PLATELET
Abs Immature Granulocytes: 0.01 10*3/uL (ref 0.00–0.07)
Basophils Absolute: 0.1 10*3/uL (ref 0.0–0.1)
Basophils Relative: 2 %
Eosinophils Absolute: 0.3 10*3/uL (ref 0.0–0.5)
Eosinophils Relative: 8 %
HCT: 38.6 % — ABNORMAL LOW (ref 39.0–52.0)
Hemoglobin: 11.7 g/dL — ABNORMAL LOW (ref 13.0–17.0)
Immature Granulocytes: 0 %
Lymphocytes Relative: 22 %
Lymphs Abs: 0.9 10*3/uL (ref 0.7–4.0)
MCH: 28.1 pg (ref 26.0–34.0)
MCHC: 30.3 g/dL (ref 30.0–36.0)
MCV: 92.8 fL (ref 80.0–100.0)
Monocytes Absolute: 0.5 10*3/uL (ref 0.1–1.0)
Monocytes Relative: 13 %
Neutro Abs: 2.1 10*3/uL (ref 1.7–7.7)
Neutrophils Relative %: 55 %
Platelets: 78 10*3/uL — ABNORMAL LOW (ref 150–400)
RBC: 4.16 MIL/uL — ABNORMAL LOW (ref 4.22–5.81)
RDW: 17.8 % — ABNORMAL HIGH (ref 11.5–15.5)
WBC: 3.8 10*3/uL — ABNORMAL LOW (ref 4.0–10.5)
nRBC: 0 % (ref 0.0–0.2)

## 2021-09-08 LAB — PHOSPHORUS: Phosphorus: 5.4 mg/dL — ABNORMAL HIGH (ref 2.5–4.6)

## 2021-09-08 LAB — COMPREHENSIVE METABOLIC PANEL
ALT: 8 U/L (ref 0–44)
AST: 15 U/L (ref 15–41)
Albumin: 3 g/dL — ABNORMAL LOW (ref 3.5–5.0)
Alkaline Phosphatase: 81 U/L (ref 38–126)
Anion gap: 13 (ref 5–15)
BUN: 32 mg/dL — ABNORMAL HIGH (ref 6–20)
CO2: 27 mmol/L (ref 22–32)
Calcium: 9.7 mg/dL (ref 8.9–10.3)
Chloride: 95 mmol/L — ABNORMAL LOW (ref 98–111)
Creatinine, Ser: 6.46 mg/dL — ABNORMAL HIGH (ref 0.61–1.24)
GFR, Estimated: 9 mL/min — ABNORMAL LOW (ref >60)
Glucose, Bld: 103 mg/dL — ABNORMAL HIGH (ref 70–99)
Potassium: 4.3 mmol/L (ref 3.5–5.1)
Sodium: 135 mmol/L (ref 135–145)
Total Bilirubin: 0.6 mg/dL (ref 0.3–1.2)
Total Protein: 7.8 g/dL (ref 6.5–8.1)

## 2021-09-08 LAB — GLUCOSE, CAPILLARY
Glucose-Capillary: 100 mg/dL — ABNORMAL HIGH (ref 70–99)
Glucose-Capillary: 97 mg/dL (ref 70–99)

## 2021-09-08 LAB — MAGNESIUM: Magnesium: 2 mg/dL (ref 1.7–2.4)

## 2021-09-08 MED ORDER — APIXABAN (ELIQUIS) VTE STARTER PACK (10MG AND 5MG)
ORAL_TABLET | ORAL | 0 refills | Status: DC
  Filled 2021-09-08: qty 74, 30d supply, fill #0

## 2021-09-08 MED ORDER — MIDODRINE HCL 5 MG PO TABS
5.0000 mg | ORAL_TABLET | Freq: Three times a day (TID) | ORAL | 0 refills | Status: DC
  Filled 2021-09-08: qty 90, 30d supply, fill #0

## 2021-09-08 MED ORDER — TRAZODONE HCL 50 MG PO TABS
50.0000 mg | ORAL_TABLET | Freq: Every evening | ORAL | 0 refills | Status: DC | PRN
Start: 2021-09-08 — End: 2022-06-19
  Filled 2021-09-08: qty 30, 30d supply, fill #0

## 2021-09-08 NOTE — Progress Notes (Signed)
[facility] Progress Note   Outpatient Dialysis Orders:  TueThuSat, 4 hrs 0 min, 180NRe Optiflux, BFR 450, DFR Manual 500 mL/min, EDW 73.5 (kg), Dialysate 2.0 K, 2.0 Ca TDC No heparin Parsabiv 7.'5mg'$  IV q HD  Assessment/Plan:  Pulmonary embolism: Had been on heparin drip, management per primary team  ESRD:  Dialysis on TTS schedule 2L UF on 8/31 No absolute indication for RRT and the patient appears to be  comfortable. Needs to go to HD on Sat as outpt; spouse bedside and they are compliant.   Hypertension/volume: BP controlled. Does take midodrine three times daily, will order an extra dose with HD as needed. UF to EDW as tolerated.   Anemia: Hb 10.8. Not on ESA outpatient, none indicated at this time.   Metabolic bone disease: Last calcium elevated. Not on VDRA. Unable to give parsabiv here, avoid meds containing calcium. Phos slightly elevated, restarted on velphoro  Nutrition:  Continue renal diet, adding protein supplements A.fib: previously on eliquis but reportedly no longer taking. RRR today, rate controlled.   Subjective:   Pt denies SOB, CP, dizziness, abdominal pain and nausea. .   Objective Vitals:   09/07/21 1323 09/07/21 2035 09/08/21 0303 09/08/21 0314  BP: (!) 98/56 93/66  90/62  Pulse: 78     Resp: '20 20  19  '$ Temp: 98 F (36.7 C) 98.7 F (37.1 C)  98.7 F (37.1 C)  TempSrc: Oral Oral  Oral  SpO2: 99% 94%  100%  Weight:   73 kg   Height:       Physical Exam General: Alert male in NAD Heart: RRR, no murmurs, rubs or gallops Lungs: CTA bilaterally without wheezing, rhonchi or rales Abdomen: Soft, non-distended, +BS Extremities: No edema b/l lower extremities Dialysis Access:  [facility]   Additional Objective Labs: Basic Metabolic Panel: Recent Labs  Lab 09/07/21 0329 09/07/21 0823 09/08/21 0537  NA 136 137 135  K 3.9 4.0 4.3  CL 99 96* 95*  CO2 '26 26 27  '$ GLUCOSE 114* 100* 103*  BUN 47* 50* 32*  CREATININE 9.39* 9.87* 6.46*  CALCIUM  9.3 9.6 9.7  PHOS 6.7* 6.9* 5.4*   Liver Function Tests: Recent Labs  Lab 09/05/21 0825 09/06/21 0627 09/07/21 0823 09/08/21 0537  AST 18 15  --  15  ALT 9 8  --  8  ALKPHOS 66 63  --  81  BILITOT 0.9 0.7  --  0.6  PROT 7.7 7.0  --  7.8  ALBUMIN 2.9* 2.7* 2.6* 3.0*   No results for input(s): "LIPASE", "AMYLASE" in the last 168 hours. CBC: Recent Labs  Lab 09/04/21 0100 09/05/21 0825 09/06/21 0627 09/07/21 0329 09/08/21 0537  WBC 4.1 4.1 3.7* 3.0* 3.8*  NEUTROABS 1.9  --  2.0 1.5* 2.1  HGB 11.6* 11.4* 10.8* 10.5* 11.7*  HCT 38.0* 37.2* 34.7* 33.9* 38.6*  MCV 91.8 91.4 91.6 91.6 92.8  PLT 66* 70* 65* 67* 78*   Blood Culture    Component Value Date/Time   SDES TRACHEAL ASPIRATE 06/21/2021 1721   SPECREQUEST NONE 06/21/2021 1721   CULT  06/21/2021 1721    RARE Normal respiratory flora-no Staph aureus or Pseudomonas seen Performed at [facility] [address] [place], [place] [address]    REPTSTATUS 06/24/2021 FINAL 06/21/2021 1721    Cardiac Enzymes: No results for input(s): "CKTOTAL", "CKMB", "CKMBINDEX", "TROPONINI" in the last 168 hours. CBG: Recent Labs  Lab 09/07/21 1320 09/07/21 1608 09/07/21 2107 09/08/21 0610 09/08/21 1117  GLUCAP 59* 99 121* 100* 97   Iron Studies:  Recent Labs    09/07/21 0329  IRON 39*  TIBC 190*  FERRITIN 536*   '@lablastinr3'$ @ Studies/Results: No results found. Medications:    apixaban  10 mg Oral BID   Followed by   Derrill Memo ON 09/14/2021] apixaban  5 mg Oral BID   Chlorhexidine Gluconate Cloth  6 each Topical Daily   Chlorhexidine Gluconate Cloth  6 each Topical Q0600   Chlorhexidine Gluconate Cloth  6 each Topical Q0600   insulin aspart  0-6 Units Subcutaneous TID WC   midodrine  5 mg Oral TID WC   sucroferric oxyhydroxide  1,000 mg Oral TID WC

## 2021-09-08 NOTE — Progress Notes (Signed)
Pt A.fib on monitor and A/O x 4. Ambulating w/ assistance. Admitted for acute pulmonary edema. See note. HD cath intact. Denies pain and SOB. Pt sent home w/ home 02 due to 02 needs. Education provided eliquis w/ friend @ bedside. Education proved on follow up care. Pt Denies questions @ this time. Pt adequate for discharge

## 2021-09-08 NOTE — Progress Notes (Signed)
D/C order noted. Contacted [facility] SW to advise clinic of pt's d/c today and that pt will resume care tomorrow.   Saraya Tirey Renal Navigator [phone] 

## 2021-09-08 NOTE — Progress Notes (Signed)
SATURATION QUALIFICATIONS: (This note is used to comply with regulatory documentation for home oxygen)  Patient Saturations on Room Air at Rest = 94%  Patient Saturations on Room Air while Ambulating = 85%  Patient Saturations on 2 Liters of oxygen while Ambulating = 98%  Please briefly explain why patient needs home oxygen:

## 2021-09-08 NOTE — Progress Notes (Addendum)
Mobility Specialist Progress Note:   09/08/21 1002  Mobility  Activity Ambulated with assistance in room  Level of Assistance Standby assist, set-up cues, supervision of patient - no hands on  Assistive Device Four wheel walker  Distance Ambulated (ft) 40 ft  Activity Response Tolerated well  $Mobility charge 1 Mobility   Pt received in bed willing to participate in mobility. No complaints of pain. Pt able to stand from bed and walk to door without assist. Left in chair with call bell in reach and all needs met.   [facility] Philis Doke Mobility Specialist

## 2021-09-08 NOTE — TOC Transition Note (Addendum)
Transition of Care [facility]) - CM/SW Discharge Note   Patient Details  Name: Nathaniel White MRN: [id number] Date of Birth: July 11, 1960  [facility] CM/SW Contact:  Zenon Mayo, RN Phone Number: 09/08/2021, 10:36 AM   Clinical Narrative:    Patient is for dc today, NCM asked him if he would like to have HHPT, he states no at first, then family members walked into the room he changed his mind and said yes, he would like to have HHPT. NCM offered choice, he does not have a preference. NCM notified Dr. Sherral Hammers of this information.   NCM made referral to [facility] with [facility].  He is able to take referral.  Soc will begin 24 to 48 hrs post dc. Also he states he needs oxygen.  Mobility tech will do ambulatory sat check to see if he qualifies for oxygen. Patient will qualifies for oxygen.  Per Doctor Sherral Hammers gives this NCM permission to order thru [employer] for 2 liters. Patient and family member is ok with [employer] supplying this oxygen for him.  The e tank will be brought up to the room and the concentrator will be delivered to his home.     Final next level of care: [place] Barriers to Discharge: No Barriers Identified   Patient Goals and CMS Choice Patient states their goals for this hospitalization and ongoing recovery are:: return home with [place] CMS Medicare.gov Compare Post Acute Care list provided to:: Patient Choice offered to / list presented to : Patient  Discharge Placement                       Discharge Plan and Services   Discharge Planning Services: CM Consult              DME Agency: NA       HH Arranged: PT HH Agency: [place] Date Ruth: 09/08/21 Time [place]: [id number] Representative spoke with at [place]: [place] ([place]) Interventions     Readmission Risk Interventions    09/06/2021    4:20 PM 06/23/2021    3:52 PM 03/15/2021   12:40 PM  Readmission Risk  Prevention Plan  Transportation Screening Complete Complete Complete  PCP or Specialist Appt within 5-7 Days   Complete  PCP or Specialist Appt within 3-5 Days Complete Complete   Home Care Screening   Complete  Medication Review (RN CM)   Complete  HRI or Home Care Consult Complete Complete   Social Work Consult for [place] Planning/Counseling Complete Complete   Palliative Care Screening Not Applicable Not Applicable   Medication Review Press photographer) Complete Complete

## 2021-09-08 NOTE — Care Management Important Message (Signed)
Important Message  Patient Details  Name: CASSELL VOORHIES MRN: [id number] Date of Birth: [birth year]   Medicare Important Message Given:  Yes     Zaidan, Kee[place] 09/08/2021, 10:45 AM

## 2021-09-08 NOTE — Discharge Summary (Signed)
Physician Discharge Summary  BRACH BIRDSALL FBP:[id number] DOB: August 08, 1960 DOA: 09/04/2021  PCP: Gildardo Pounds, NP  Admit date: 09/04/2021 Discharge date: 09/08/2021  Time spent: 35 minutes  Recommendations for Outpatient Follow-up:   Acute right lower lobe pulmonary embolism - Admit patient to the hospital.  Has elevated BNP and troponin levels.  Lower extremity Dopplers negative for DVT.   -Currently on heparin drip.   -8/31 given that patient had been off Eliquis for a full month decided to treat patient as if he had never started Eliquis.  Start patient on Eliquis -8/31 obtain ambulatory SPO2 place findings in appropriate epic note for insurance purposes, inform physician when complete.     Chronic systolic CHF - Strict in and out -715.52m - Daily weight Filed Weights   09/06/21 0404 09/07/21 0500 09/08/21 0303  Weight: 73.5 kg 74.2 kg 73 kg   - 8/30 no significant change on echocardiogram 8/29 when compared to echocardiogram 3/6 - 8/31 cardiology concurs with not placing patient on guideline directed medical therapy (GDMT) for CHF secondary to chronic hypotension and ESRD   Severe pulmonary HTN - See CHF   History of paroxysmal atrial fibrillation - Not on any rate control medication.   -Was prescribed Eliquis but has not taken it in 1 month. -See acute PE, and CHF -8/31 D/C heparin drip restart Eliquis   Brief episode of NSVT - Echo done as mentioned above monitor electrolytes and replete.  Admitting provider notified cardiology recommended observation for now.  He had similar episodes back in June when he was admitted for necrotizing pneumonia. -Cardiology on board concurs with not adding beta-blocker secondary to patient's hypotension.  Concurs with maximizing electrolytes.   Chronic hypotension - Midodrine 3 times daily   ESRD on  HD T/TH/SAT -HD per nephrology     Diabetes mellitus type 2 - Does not appear to be any medication.  -8/29 hemoglobin A1c= 5.2 -  No need to start medication at this time well-controlled - 8/31 LDL= 37 at goal   Anemia of chronic disease and thrombocytopenia -8/30 anemia panel pending    Hx CVA with residual left-sided weakness      Discharge Diagnoses:  Principal Problem:   Acute pulmonary embolism ([place] Active Problems:   ESRD (end stage renal disease) (HCC)   HFrEF (heart failure with reduced ejection fraction) (HCC)   NSVT (nonsustained ventricular tachycardia) (HCC)   Type 2 diabetes mellitus (HCC)   Thrombocytopenia (HCC)   Shortness of breath   Pulmonary embolism (HCC)   Pulmonary embolus, right (HCC)   Chronic systolic CHF (congestive heart failure) (HCC)   Severe pulmonary hypertension (HCC)   End-stage renal disease on hemodialysis ([place]   Diabetes mellitus type 2, controlled, without complications ([facility]   Anemia of chronic disease   Discharge Condition: Stable  Diet recommendation: Renal/carb modified  Filed Weights   09/06/21 0404 09/07/21 0500 09/08/21 0303  Weight: 73.5 kg 74.2 kg 73 kg    History of present illness:  61year old BM PMHx ESRD on HD T/TH/SAT, nonischemic cardiomyopathy with EF 35%, severe pulmonary HTN with RV failure, paroxysmal A-fib, prior history of CVA with residual left-sided weakness, recent history of necrotizing pneumonia, noncompliance with anticoagulation.     Comes to the hospital with progressive shortness of breath for 1 month.  He has been prescribed Eliquis but has not taken it in the last month.  In the ED was noted to have brief run of nonsustained V. tach.  CTA chest was positive  for PE and started on heparin drip.  Troponin mildly elevated, BNP elevated     Hospital Course:  See above  Procedures: 8/29 echocardiogram -Left Ventricle: Left ventricular ejection fraction, by estimation, is 30-to 35%. The left ventricle has moderately decreased function. left ventricle demonstrates global hypokinesis.  -severe concentric LVH  Right Ventricle:  severely enlarged.  -Right ventricular systolic function is moderately reduced.  -severely elevated pulmonary artery systolic pressure. estimated right ventricular systolic pressure is 77.8 mmHg.  Left Atrium: Left atrial size was severely dilated.  Mitral Valve: Moderate to severe mitral valve regurgitation.  Tricuspid Valve: Tricuspid valve regurgitation is moderate to severe.    Consultations: Cardiology Nephrology     Discharge Exam: Vitals:   09/07/21 1323 09/07/21 2035 09/08/21 0303 09/08/21 0314  BP: (!) 98/56 93/66  90/62  Pulse: 78     Resp: '20 20  19  '$ Temp: 98 F (36.7 C) 98.7 F (37.1 C)  98.7 F (37.1 C)  TempSrc: Oral Oral  Oral  SpO2: 99% 94%  100%  Weight:   73 kg   Height:        General: No acute respiratory distress Lungs: Clear to auscultation bilaterally without wheezes or crackles Cardiovascular: Regular rate and rhythm without murmur gallop or rub normal S1 and S2   Discharge Instructions   Allergies as of 09/08/2021       Reactions   Codeine Shortness Of Breath   Dextromethorphan-guaifenesin Shortness Of Breath   Iron Dextran Shortness Of Breath   Morphine And Related Shortness Of Breath        Medication List     STOP taking these medications    apixaban 2.5 MG Tabs tablet Commonly known as: ELIQUIS Replaced by: Apixaban Starter Pack ('10mg'$  and '5mg'$ )   Dialyvite 800 0.8 MG Tabs       TAKE these medications    acetaminophen 500 MG tablet Commonly known as: TYLENOL Take 1 tablet (500 mg total) by mouth every 6 (six) hours as needed.   albuterol 108 (90 Base) MCG/ACT inhaler Commonly known as: ProAir HFA Inhale 2 puffs into the lungs every 6 (six) hours as needed for wheezing or shortness of breath. What changed:  how much to take when to take this   albuterol (2.5 MG/3ML) 0.083% nebulizer solution Commonly known as: PROVENTIL Take 3 mLs (2.5 mg total) by nebulization every 6 (six) hours as needed for wheezing or  shortness of breath. What changed: when to take this   Apixaban Starter Pack ('10mg'$  and '5mg'$ ) Commonly known as: ELIQUIS STARTER PACK Take as directed on package: start with two-'5mg'$  tablets twice daily for 7 days. On day 8, switch to one-'5mg'$  tablet twice daily. Replaces: apixaban 2.5 MG Tabs tablet   docusate sodium 100 MG capsule Commonly known as: COLACE Take 1 capsule (100 mg total) by mouth 2 (two) times daily. What changed:  when to take this reasons to take this   fluticasone-salmeterol 250-50 MCG/ACT Aepb Commonly known as: Advair Diskus Inhale 1 puff into the lungs in the morning and at bedtime. NEEDS PASS What changed:  when to take this additional instructions   hydrOXYzine 10 MG tablet Commonly known as: ATARAX Take 1 tablet (10 mg total) by mouth 3 (three) times daily as needed. For anxiety What changed:  when to take this reasons to take this additional instructions   midodrine 5 MG tablet Commonly known as: PROAMATINE Take 1 tablet (5 mg total) by mouth 3 (three) times daily with meals.  What changed:  medication strength how much to take how to take this   montelukast 10 MG tablet Commonly known as: SINGULAIR Take 1 tablet (10 mg total) by mouth at bedtime. What changed: when to take this   pantoprazole 40 MG tablet Commonly known as: Protonix Take 1 tablet (40 mg total) by mouth daily.   traZODone 50 MG tablet Commonly known as: DESYREL Take 1 tablet (50 mg total) by mouth at bedtime as needed for sleep.   Velphoro 500 MG chewable tablet Generic drug: sucroferric oxyhydroxide Chew 1,000 mg by mouth 2 (two) times daily after a meal.   Visine 0.05 % ophthalmic solution Generic drug: tetrahydrozoline Place 4 drops into both eyes daily.       Allergies  Allergen Reactions   Codeine Shortness Of Breath   Dextromethorphan-Guaifenesin Shortness Of Breath   Iron Dextran Shortness Of Breath   Morphine And Related Shortness Of Breath     Follow-up Information     Gildardo Pounds, NP Follow up.   Specialty: Nurse Practitioner Why: GO: Sept. 19 at 2pm Contact information: [place] [place] [id number] [phone]         Burnell Blanks, MD .   Specialty: Cardiology Why: GO: SEPT. 6 7:45AM Contact information: [place]  [id number] [phone]                  The results of significant diagnostics from this hospitalization (including imaging, microbiology, ancillary and laboratory) are listed below for reference.    Significant Diagnostic Studies: VAS [place] LOWER EXTREMITY VENOUS (DVT)  Result Date: 09/05/2021  Lower Venous DVT Study Patient Name:  DEMONTAY GRANTHAM  Date of Exam:   09/05/2021 Medical Rec #: [id number]        Accession #:    [id number] Date of Birth: [birth year]        Patient Gender: M Patient Age:   61 years Exam Location:  [facility] Procedure:      VAS [place] LOWER EXTREMITY VENOUS (DVT) Referring Phys: Gean Birchwood --------------------------------------------------------------------------------  Indications: Pulmonary embolism.  Limitations: Poor ultrasound/tissue interface. Comparison Study: no prior Performing Technologist: Archie Patten RVS  Examination Guidelines: A complete evaluation includes B-mode imaging, spectral Doppler, color Doppler, and power Doppler as needed of all accessible portions of each vessel. Bilateral testing is considered an integral part of a complete examination. Limited examinations for reoccurring indications may be performed as noted. The reflux portion of the exam is performed with the patient in reverse Trendelenburg.  +--------+---------------+---------+-----------+----------+--------------------+ RIGHT   CompressibilityPhasicitySpontaneityPropertiesThrombus Aging       +--------+---------------+---------+-----------+----------+--------------------+ CFV     Full           Yes      Yes                                        +--------+---------------+---------+-----------+----------+--------------------+ SFJ     Full                                                              +--------+---------------+---------+-----------+----------+--------------------+ FV Prox Full                                                              +--------+---------------+---------+-----------+----------+--------------------+  FV Mid  Full                                                              +--------+---------------+---------+-----------+----------+--------------------+ FV      Full                                                              Distal                                                                    +--------+---------------+---------+-----------+----------+--------------------+ PFV     Full                                                              +--------+---------------+---------+-----------+----------+--------------------+ POP     Full           Yes      Yes                                       +--------+---------------+---------+-----------+----------+--------------------+ PTV                    Yes      Yes                  patent by color                                                           doppler              +--------+---------------+---------+-----------+----------+--------------------+ PERO                                                 Not well visualized  +--------+---------------+---------+-----------+----------+--------------------+   +--------+---------------+---------+-----------+----------+--------------------+ LEFT    CompressibilityPhasicitySpontaneityPropertiesThrombus Aging       +--------+---------------+---------+-----------+----------+--------------------+ CFV     Full           Yes      Yes                                        +--------+---------------+---------+-----------+----------+--------------------+ SFJ     Full                                                              +--------+---------------+---------+-----------+----------+--------------------+  FV Prox Full                                                              +--------+---------------+---------+-----------+----------+--------------------+ FV Mid  Full                                                              +--------+---------------+---------+-----------+----------+--------------------+ FV      Full                                                              Distal                                                                    +--------+---------------+---------+-----------+----------+--------------------+ PFV     Full                                                              +--------+---------------+---------+-----------+----------+--------------------+ POP     Full           Yes      Yes                                       +--------+---------------+---------+-----------+----------+--------------------+ PTV                    Yes      Yes                  patent by color                                                           doppler              +--------+---------------+---------+-----------+----------+--------------------+ PERO                                                 Not well visualized  +--------+---------------+---------+-----------+----------+--------------------+     Summary: BILATERAL: - No evidence of deep vein thrombosis seen in the lower extremities, bilaterally. -No evidence of popliteal cyst, bilaterally.   *See  table(s) above for measurements and observations. Electronically signed by Deitra Mayo MD on 09/05/2021 at 4:52:51 PM.    Final    ECHOCARDIOGRAM COMPLETE  Result Date: 09/05/2021    ECHOCARDIOGRAM REPORT   Patient Name:   MAGDALENO LORTIE  Date of Exam: 09/05/2021 Medical Rec #:  [id number]       Height:       69.0 in Accession #:    [id number]      Weight:       165.8 lb Date of Birth:  1960/09/06       BSA:          1.908 m Patient Age:    38 years        BP:           127/98 mmHg Patient Gender: M               HR:           68 bpm. Exam Location:  Inpatient Procedure: 2D Echo, Cardiac Doppler, Color Doppler and Intracardiac            Opacification Agent                                 MODIFIED REPORT:     This report was modified by Rudean Haskell MD on 09/05/2021 due to                        Clarification of recommendations.  Indications:     Pulmonary embolus  History:         Patient has prior history of Echocardiogram examinations, most                  recent 03/13/2021. CHF, COPD, Arrythmias:Atrial Fibrillation,                  Signs/Symptoms:Shortness of Breath; Risk Factors:Current                  Smoker, Hypertension and Diabetes. Hx stroke. ESRD.  Sonographer:     Clayton Lefort RDCS (AE) Referring Phys:  [id number] Lew [facility] Diagnosing Phys: Rudean Haskell MD IMPRESSIONS  1. Left ventricular ejection fraction, by estimation, is 30 to 35%. The left ventricle has moderately decreased function. The left ventricle demonstrates global hypokinesis. There is severe concentric left ventricular hypertrophy. Left ventricular diastolic parameters are indeterminate.  2. Right ventricular systolic function is moderately reduced. The right ventricular size is severely enlarged. There is severely elevated pulmonary artery systolic pressure. The estimated right ventricular systolic pressure is 82.9 mmHg.  3. Left atrial size was severely dilated.  4. Right atrial size was mildly dilated.  5. Regurgitant volume 42 ml, No right sided pulmonary vein systolic blunting. There is a mixed etiology of mitral regurgitation: posterior restriction and functional MR. Calcified mitral valve with at least mild mitral regurgitation. The mitral valve is   degenerative. Moderate to severe mitral valve regurgitation. Mild mitral stenosis. The mean mitral valve gradient is 5.0 mmHg with average heart rate of 79 bpm. Moderate mitral annular calcification.  6. Tricuspid valve regurgitation is moderate to severe.  7. The aortic valve is tricuspid. There is mild calcification of the aortic valve. There is mild thickening of the aortic valve. Aortic valve regurgitation is not visualized. Aortic valve sclerosis is present, with no evidence of aortic valve stenosis. Comparison(s): Prior images reviewed  side by side. Increase in mitral valve and tricuspid valve regurgitation from prior. When patient is at dry weight, consideration of TEE for characterization of MR/MS is reasonable (inpatient vs outpatient). FINDINGS  Left Ventricle: Left ventricular ejection fraction, by estimation, is 30 to 35%. The left ventricle has moderately decreased function. The left ventricle demonstrates global hypokinesis. Definity contrast agent was given IV to delineate the left ventricular endocardial borders. The left ventricular internal cavity size was normal in size. There is severe concentric left ventricular hypertrophy. Left ventricular diastolic parameters are indeterminate. Right Ventricle: The right ventricular size is severely enlarged. No increase in right ventricular wall thickness. Right ventricular systolic function is moderately reduced. There is severely elevated pulmonary artery systolic pressure. The tricuspid regurgitant velocity is 3.69 m/s, and with an assumed right atrial pressure of 8 mmHg, the estimated right ventricular systolic pressure is 87.5 mmHg. Left Atrium: Left atrial size was severely dilated. Right Atrium: Right atrial size was mildly dilated. Pericardium: There is no evidence of pericardial effusion. Mitral Valve: Regurgitant volume 42 ml, No right sided pulmonary vein systolic blunting. There is a mixed etiology of mitral regurgitation: posterior restriction  and functional MR. Calcified mitral valve with at least mild mitral regurgitation. The mitral valve is degenerative in appearance. Moderate mitral annular calcification. Moderate to severe mitral valve regurgitation. Mild mitral valve stenosis. The mean mitral valve gradient is 5.0 mmHg with average heart rate of 79 bpm. Tricuspid Valve: The tricuspid valve is normal in structure. Tricuspid valve regurgitation is moderate to severe. Aortic Valve: The aortic valve is tricuspid. There is mild calcification of the aortic valve. There is mild thickening of the aortic valve. There is moderate aortic valve annular calcification. Aortic valve regurgitation is not visualized. Aortic valve sclerosis is present, with no evidence of aortic valve stenosis. Aortic valve mean gradient measures 2.0 mmHg. Aortic valve peak gradient measures 4.5 mmHg. Aortic valve area, by VTI measures 1.47 cm. Pulmonic Valve: The pulmonic valve was normal in structure. Pulmonic valve regurgitation is trivial. No evidence of pulmonic stenosis. Aorta: The aortic root is normal in size and structure. IAS/Shunts: No atrial level shunt detected by color flow Doppler.  LEFT VENTRICLE PLAX 2D LVIDd:         5.50 cm LVIDs:         4.60 cm LV PW:         1.60 cm LV IVS:        1.50 cm LVOT diam:     2.10 cm LV SV:         28 LV SV Index:   15 LVOT Area:     3.46 cm  LV Volumes (MOD) LV vol d, MOD A2C: 188.0 ml LV vol d, MOD A4C: 186.0 ml LV vol s, MOD A2C: 117.0 ml LV vol s, MOD A4C: 127.0 ml LV SV MOD A2C:     71.0 ml LV SV MOD A4C:     186.0 ml LV SV MOD BP:      62.7 ml RIGHT VENTRICLE            IVC RV S prime:     5.08 cm/s  IVC diam: 2.00 cm TAPSE (M-mode): 0.7 cm LEFT ATRIUM            Index        RIGHT ATRIUM           Index LA diam:      4.60 cm  2.41 cm/m   RA Area:  22.80 cm LA Vol (A4C): 101.0 ml 52.94 ml/m  RA Volume:   66.00 ml  34.60 ml/m  AORTIC VALVE AV Area (Vmax):    1.65 cm AV Area (Vmean):   1.38 cm AV Area (VTI):     1.47  cm AV Vmax:           106.00 cm/s AV Vmean:          72.700 cm/s AV VTI:            0.191 m AV Peak Grad:      4.5 mmHg AV Mean Grad:      2.0 mmHg LVOT Vmax:         50.40 cm/s LVOT Vmean:        29.000 cm/s LVOT VTI:          0.081 m LVOT/AV VTI ratio: 0.43  AORTA Ao Root diam: 3.20 cm Ao Asc diam:  3.30 cm MITRAL VALVE                  TRICUSPID VALVE MV Mean grad: 5.0 mmHg        TR Peak grad:   54.5 mmHg MR Peak grad:    79.6 mmHg    TR Vmax:        369.00 cm/s MR Mean grad:    42.0 mmHg MR Vmax:         446.00 cm/s  SHUNTS MR Vmean:        290.0 cm/s   Systemic VTI:  0.08 m MR PISA:         4.02 cm     Systemic Diam: 2.10 cm MR PISA Eff ROA: 35 mm MR PISA Radius:  0.80 cm Rudean Haskell MD Electronically signed by Rudean Haskell MD Signature Date/Time: 09/05/2021/9:40:38 AM    Final (Updated)    CT Angio Chest PE W and/or Wo Contrast  Result Date: 09/04/2021 CLINICAL DATA:  Shortness of breath EXAM: CT ANGIOGRAPHY CHEST WITH CONTRAST TECHNIQUE: Multidetector CT imaging of the chest was performed using the standard protocol during bolus administration of intravenous contrast. Multiplanar CT image reconstructions and MIPs were obtained to evaluate the vascular anatomy. RADIATION DOSE REDUCTION: This exam was performed according to the departmental dose-optimization program which includes automated exposure control, adjustment of the mA and/or kV according to patient size and/or use of iterative reconstruction technique. CONTRAST:  59m OMNIPAQUE IOHEXOL 350 MG/ML SOLN COMPARISON:  Noncontrast chest CT 06/21/2021 FINDINGS: Cardiovascular: Cardiomegaly. Preferential dilatation of the right heart with 1.74 RV to LV ratio and hepatic venous reflux, likely chronic when compared to prior and when considering the pulmonary arteries. There is non enhancement asymmetrically involving right lower lobe segmental arteries. Some mixing artifact is noted within the pulmonary arteries, worse in the right  lung. Pulmonary arteries show chronic diffuse wall calcification which is linear. Atheromatous calcification. Perma catheter on the left in good position. Mediastinum/Nodes: Negative for adenopathy or mass. Lungs/Pleura: Pleural thickening with calcification asymmetrically involving the right hemithorax. There are bands of opacity in the bilateral lungs from atelectasis/scarring, similar to prior. Improvement in left lower lobe aeration since prior. Upper Abdomen: No acute finding. Partially covered polycystic kidneys. Musculoskeletal: Generalized osteopenia. Review of the MIP images confirms the above findings. Critical Value/emergent results were called by telephone at the time of interpretation on 09/04/2021 at 4:45 am to [facility], who verbally acknowledged these results. IMPRESSION: 1. Positive for right lower lobe segmental branch clot. There is a background of pulmonary  hypertension with dilated right heart and pulmonary artery calcification. 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 Chronic lung disease; improved left lower lobe aeration since June 2023. 3.  Aortic Atherosclerosis (ICD10-I70.0). Electronically Signed   By: Jorje Guild M.D.   On: 09/04/2021 04:47   DG Chest Portable 1 View  Result Date: 09/04/2021 CLINICAL DATA:  Shortness of breath EXAM: PORTABLE CHEST 1 VIEW COMPARISON:  07/10/2021 FINDINGS: Scarring in the right mid/lower lung and lingula. Right apical pleural-parenchymal scarring. No focal consolidation. Chronic blunting of the right costophrenic angle. No pleural effusion or pneumothorax. Stable cardiomegaly. Left chest dual lumen dialysis catheter terminating at the cavoatrial junction. IMPRESSION: Stable cardiomegaly. Scattered scarring. No evidence of acute cardiopulmonary disease. Electronically Signed   By: Julian Hy M.D.   On: 09/04/2021 01:51   VAS [place] UPPER EXT VEIN MAPPING (PRE-OP AVF)  Result Date: 08/18/2021 UPPER EXTREMITY VEIN MAPPING Patient Name:   WAYMOND MEADOR  Date of Exam:   08/18/2021 Medical Rec #: [id number]        Accession #:    [id number] Date of Birth: 1960/05/01        Patient Gender: M Patient Age:   62 years Exam Location:  [facility] Procedure:      VAS [place] UPPER EXT VEIN MAPPING (PRE-OP AVF) Referring Phys: Karoline Caldwell --------------------------------------------------------------------------------  Indications: Pre-access. History: Ligation of left arteriovenous fistula 06/20/2021. Right forearm graft  Limitations: Left upper arm contracted Performing Technologist: Alvia Grove RVT  Examination Guidelines: A complete evaluation includes B-mode imaging, spectral Doppler, color Doppler, and power Doppler as needed of all accessible portions of each vessel. Bilateral testing is considered an integral part of a complete examination. Limited examinations for reoccurring indications may be performed as noted. +-----------------+-------------+----------+--------------+ Right Cephalic   Diameter (cm)Depth (cm)   Findings    +-----------------+-------------+----------+--------------+ Shoulder                                not visualized +-----------------+-------------+----------+--------------+ Prox upper arm                          not visualized +-----------------+-------------+----------+--------------+ Mid upper arm                           not visualized +-----------------+-------------+----------+--------------+ Dist upper arm                          not visualized +-----------------+-------------+----------+--------------+ Antecubital fossa    0.14               not visualized +-----------------+-------------+----------+--------------+ Prox forearm                            not visualized +-----------------+-------------+----------+--------------+ Mid forearm          0.11                   graft      +-----------------+-------------+----------+--------------+  +-----------------+-------------+----------+--------+ Right Basilic    Diameter (cm)Depth (cm)Findings +-----------------+-------------+----------+--------+ Prox upper arm       0.53                        +-----------------+-------------+----------+--------+ Mid upper arm        0.45                        +-----------------+-------------+----------+--------+  Dist upper arm       0.24                        +-----------------+-------------+----------+--------+ Antecubital fossa    0.18                        +-----------------+-------------+----------+--------+ Prox forearm         0.18                        +-----------------+-------------+----------+--------+ Summary:   Measurements above. *See table(s) above for measurements and observations.  Diagnosing physician: Orlie Pollen Electronically signed by Orlie Pollen on 08/18/2021 at 5:38:01 PM.    Final    VAS [place] UPPER EXTREMITY ARTERIAL DUPLEX  Result Date: 08/18/2021  UPPER EXTREMITY DUPLEX STUDY Patient Name:  BROADUS COSTILLA  Date of Exam:   08/18/2021 Medical Rec #: [id number]        Accession #:    [id number] Date of Birth: 1961/01/02        Patient Gender: M Patient Age:   39 years Exam Location:  [facility] Procedure:      VAS [place] UPPER EXTREMITY ARTERIAL DUPLEX Referring Phys: Vonna Kotyk ROBINS --------------------------------------------------------------------------------  Indications: Pre-operative exam.  Risk Factors:  Hypertension, Diabetes, past history of smoking, prior CVA. Other Factors: 06/20/2021 Ligation of arteriovenous fistula on the left. Right                forearm graft Limitations: Left arm contracted. Performing Technologist: Alvia Grove RVT  Examination Guidelines: A complete evaluation includes B-mode imaging, spectral Doppler, color Doppler, and power Doppler as needed of all accessible portions of each vessel. Bilateral testing is considered an integral part of a complete examination.  Limited examinations for reoccurring indications may be performed as noted.  Right Pre-Dialysis Findings: +-----------------------+----------+--------------------+---------+--------+ Location               PSV (cm/s)Intralum. Diam. (cm)Waveform Comments +-----------------------+----------+--------------------+---------+--------+ Brachial Antecub. fossa40        0.74                triphasic         +-----------------------+----------+--------------------+---------+--------+ Radial Art at Wrist    56        0.26                triphasic         +-----------------------+----------+--------------------+---------+--------+ Ulnar Art at Wrist     50        0.15                triphasic         +-----------------------+----------+--------------------+---------+--------+  Left Pre-Dialysis Findings: +-----------------------+----------+--------------------+----------+--------+ Location               PSV (cm/s)Intralum. Diam. (cm)Waveform  Comments +-----------------------+----------+--------------------+----------+--------+ Brachial Antecub. fossa41        0.50                triphasic          +-----------------------+----------+--------------------+----------+--------+ Radial Art at Wrist    35        0.17                biphasic           +-----------------------+----------+--------------------+----------+--------+ Ulnar Art at Wrist     15        0.17  monophasicbrisk    +-----------------------+----------+--------------------+----------+--------+  Summary:   Measurements above.  NOTE: This was a technically difficult exam. *See table(s) above for measurements and observations. Electronically signed by Orlie Pollen on 08/18/2021 at 5:32:56 PM.    Final     Microbiology: Recent Results (from the past 240 hour(s))  SARS Coronavirus 2 by RT PCR (hospital order, performed in [facility] hospital lab) *cepheid single result test* Anterior Nasal Swab      Status: None   Collection Time: 09/04/21  2:22 AM   Specimen: Anterior Nasal Swab  Result Value Ref Range Status   SARS Coronavirus 2 by RT PCR NEGATIVE NEGATIVE Final    Comment: (NOTE) SARS-CoV-2 target nucleic acids are NOT DETECTED.  The SARS-CoV-2 RNA is generally detectable in upper and lower respiratory specimens during the acute phase of infection. The lowest concentration of SARS-CoV-2 viral copies this assay can detect is 250 copies / mL. A negative result does not preclude SARS-CoV-2 infection and should not be used as the sole basis for treatment or other patient management decisions.  A negative result may occur with improper specimen collection / handling, submission of specimen other than nasopharyngeal swab, presence of viral mutation(s) within the areas targeted by this assay, and inadequate number of viral copies (<250 copies / mL). A negative result must be combined with clinical observations, patient history, and epidemiological information.  Fact Sheet for Patients:   https://www.patel.info/  Fact Sheet for Healthcare Providers: https://hall.com/  This test is not yet approved or  cleared by the [place] FDA and has been authorized for detection and/or diagnosis of SARS-CoV-2 by FDA under an Emergency Use Authorization (EUA).  This EUA will remain in effect (meaning this test can be used) for the duration of the COVID-19 declaration under Section 564(b)(1) of the Act, 21 U.S.C. section 360bbb-3(b)(1), unless the authorization is terminated or revoked sooner.  Performed at [facility], [address] [place], [place] [address]      Labs: Basic Metabolic Panel: Recent Labs  Lab 09/05/21 0825 09/06/21 1062 09/07/21 0329 09/07/21 0823 09/08/21 0537  NA 135 136 136 137 135  K 4.8 3.5 3.9 4.0 4.3  CL 97* 97* 99 96* 95*  CO2 '23 25 26 26 27  '$ GLUCOSE 92 110* 114* 100* 103*  BUN 54* 34* 47* 50*  32*  CREATININE 11.54* 7.51* 9.39* 9.87* 6.46*  CALCIUM 9.6 9.5 9.3 9.6 9.7  MG  --  1.9 2.0  --  2.0  PHOS  --  5.8* 6.7* 6.9* 5.4*   Liver Function Tests: Recent Labs  Lab 09/04/21 0100 09/05/21 0825 09/06/21 0627 09/07/21 0823 09/08/21 0537  AST '19 18 15  '$ --  15  ALT '9 9 8  '$ --  8  ALKPHOS 74 66 63  --  81  BILITOT 1.0 0.9 0.7  --  0.6  PROT 8.0 7.7 7.0  --  7.8  ALBUMIN 3.2* 2.9* 2.7* 2.6* 3.0*   No results for input(s): "LIPASE", "AMYLASE" in the last 168 hours. No results for input(s): "AMMONIA" in the last 168 hours. CBC: Recent Labs  Lab 09/04/21 0100 09/05/21 0825 09/06/21 0627 09/07/21 0329 09/08/21 0537  WBC 4.1 4.1 3.7* 3.0* 3.8*  NEUTROABS 1.9  --  2.0 1.5* 2.1  HGB 11.6* 11.4* 10.8* 10.5* 11.7*  HCT 38.0* 37.2* 34.7* 33.9* 38.6*  MCV 91.8 91.4 91.6 91.6 92.8  PLT 66* 70* 65* 67* 78*   Cardiac Enzymes: No results for input(s): "CKTOTAL", "  CKMB", "CKMBINDEX", "TROPONINI" in the last 168 hours. BNP: BNP (last 3 results) Recent Labs    06/20/21 0444 09/04/21 0100  BNP 2,645.2* 2,911.4*    ProBNP (last 3 results) No results for input(s): "PROBNP" in the last 8760 hours.  CBG: Recent Labs  Lab 09/07/21 0620 09/07/21 1320 09/07/21 1608 09/07/21 2107 09/08/21 0610  GLUCAP 144* 59* 99 121* 100*       Signed:  Dia Crawford, MD [facility]

## 2021-09-08 NOTE — Progress Notes (Signed)
Mobility Specialist Progress Note:   09/08/21 1112  Mobility  Activity Ambulated with assistance in hallway  Level of Assistance Standby assist, set-up cues, supervision of patient - no hands on  Assistive Device Four wheel walker  Distance Ambulated (ft) 60 ft  Activity Response Tolerated well  $Mobility charge 1 Mobility   Pt in chair needing oxygen saturation test. No complaints of pain. Left in chair with call bell in reach and all needs met.   [facility] Alekzander Cardell Mobility Specialist

## 2021-09-08 NOTE — Progress Notes (Addendum)
Order for ambulatory noted, however patient refused patient stated he has not walked in over a year.

## 2021-09-08 NOTE — Progress Notes (Addendum)
Rounding Note    Patient Name: Nathaniel White Date of Encounter: 09/08/2021  [place] Cardiologist: Lauree Chandler, MD   Subjective   No chest pain. Breathing better.   Inpatient Medications    Scheduled Meds:  apixaban  10 mg Oral BID   Followed by   Derrill Memo ON 09/14/2021] apixaban  5 mg Oral BID   Chlorhexidine Gluconate Cloth  6 each Topical Daily   Chlorhexidine Gluconate Cloth  6 each Topical Q0600   Chlorhexidine Gluconate Cloth  6 each Topical Q0600   insulin aspart  0-6 Units Subcutaneous TID WC   midodrine  5 mg Oral TID WC   sucroferric oxyhydroxide  1,000 mg Oral TID WC   Continuous Infusions:  PRN Meds: acetaminophen **OR** acetaminophen, guaiFENesin, hydrALAZINE, ipratropium-albuterol, metoprolol tartrate, senna-docusate, traZODone   Vital Signs    Vitals:   09/07/21 1323 09/07/21 2035 09/08/21 0303 09/08/21 0314  BP: (!) 98/56 93/66  90/62  Pulse: 78     Resp: '20 20  19  '$ Temp: 98 F (36.7 C) 98.7 F (37.1 C)  98.7 F (37.1 C)  TempSrc: Oral Oral  Oral  SpO2: 99% 94%  100%  Weight:   73 kg   Height:        Intake/Output Summary (Last 24 hours) at 09/08/2021 0939 Last data filed at 09/08/2021 0400 Gross per 24 hour  Intake 597 ml  Output 2000 ml  Net -1403 ml      09/08/2021    3:03 AM 09/07/2021    5:00 AM 09/06/2021    4:04 AM  Last 3 Weights  Weight (lbs) 160 lb 14.4 oz 163 lb 9.3 oz 162 lb  Weight (kg) 72.984 kg 74.2 kg 73.483 kg      Telemetry    SR, PVCs - Personally Reviewed  ECG    N/A  Physical Exam   GEN: No acute distress.   Neck: No JVD Cardiac: irregular, + murmurs, rubs, or gallops.  Respiratory: Clear to auscultation bilaterally. GI: Soft, nontender, non-distended  MS: No edema; No deformity. Neuro:  Nonfocal  Psych: Normal affect   Labs    High Sensitivity Troponin:   Recent Labs  Lab 09/04/21 0100 09/04/21 0454 09/04/21 2216 09/05/21 0116  TROPONINIHS 72* 69* 80* 62*      Chemistry Recent Labs  Lab 09/05/21 0825 09/06/21 0627 09/07/21 0329 09/07/21 0823 09/08/21 0537  NA 135 136 136 137 135  K 4.8 3.5 3.9 4.0 4.3  CL 97* 97* 99 96* 95*  CO2 '23 25 26 26 27  '$ GLUCOSE 92 110* 114* 100* 103*  BUN 54* 34* 47* 50* 32*  CREATININE 11.54* 7.51* 9.39* 9.87* 6.46*  CALCIUM 9.6 9.5 9.3 9.6 9.7  MG  --  1.9 2.0  --  2.0  PROT 7.7 7.0  --   --  7.8  ALBUMIN 2.9* 2.7*  --  2.6* 3.0*  AST 18 15  --   --  15  ALT 9 8  --   --  8  ALKPHOS 66 63  --   --  81  BILITOT 0.9 0.7  --   --  0.6  GFRNONAA 5* 8* 6* 6* 9*  ANIONGAP '15 14 11 15 13    '$ Lipids  Recent Labs  Lab 09/07/21 0329  CHOL 97  TRIG 44  HDL 51  LDLCALC 37  CHOLHDL 1.9    Hematology Recent Labs  Lab 09/06/21 0627 09/07/21 0329 09/08/21 0537  WBC 3.7*  3.0* 3.8*  RBC 3.79* 3.70*  3.69* 4.16*  HGB 10.8* 10.5* 11.7*  HCT 34.7* 33.9* 38.6*  MCV 91.6 91.6 92.8  MCH 28.5 28.4 28.1  MCHC 31.1 31.0 30.3  RDW 18.0* 17.8* 17.8*  PLT 65* 67* 78*   Thyroid No results for input(s): "TSH", "FREET4" in the last 168 hours.  BNP Recent Labs  Lab 09/04/21 0100  BNP 2,911.4*     Radiology    No results found.  Cardiac Studies   Echo 09/05/21  1. Left ventricular ejection fraction, by estimation, is 30 to 35%. The  left ventricle has moderately decreased function. The left ventricle  demonstrates global hypokinesis. There is severe concentric left  ventricular hypertrophy. Left ventricular  diastolic parameters are indeterminate.   2. Right ventricular systolic function is moderately reduced. The right  ventricular size is severely enlarged. There is severely elevated  pulmonary artery systolic pressure. The estimated right ventricular  systolic pressure is 49.7 mmHg.   3. Left atrial size was severely dilated.   4. Right atrial size was mildly dilated.   5. Regurgitant volume 42 ml, No right sided pulmonary vein systolic  blunting. There is a mixed etiology of mitral regurgitation:  posterior  restriction and functional MR. Calcified mitral valve with at least mild  mitral regurgitation. The mitral valve is   degenerative. Moderate to severe mitral valve regurgitation. Mild mitral  stenosis. The mean mitral valve gradient is 5.0 mmHg with average heart  rate of 79 bpm. Moderate mitral annular calcification.   6. Tricuspid valve regurgitation is moderate to severe.   7. The aortic valve is tricuspid. There is mild calcification of the  aortic valve. There is mild thickening of the aortic valve. Aortic valve  regurgitation is not visualized. Aortic valve sclerosis is present, with  no evidence of aortic valve stenosis.   Comparison(s): Prior images reviewed side by side. Increase in mitral  valve and tricuspid valve regurgitation from prior. When patient is at dry  weight, consideration of TEE for characterization of MR/MS is reasonable  (inpatient vs outpatient).     Patient Profile     61 y.o. male with a hx of ESRD on HD, nonischemic cardiomyopathy (EF 35% in March 2023), pulmonary hypertension, RV failure, paroxysmal atrial fibrillation, prior stroke with left-sided weakness, PAD seen for NSVT/PVCs in setting of acute PE  Assessment & Plan     NSVT Frequent PVCs - Frequency of NSVT improved. Continues to have frequent PVCs and ventricular trigeminy.  - Unable to add BB due to chronic hypotension requiring midodrine  - Keep K > 4 and Mg > 2 - Felt due to pulmonary edema  - could consider monitor at outpatient to quantity burden or start low dose amiodarone.    3. PAF - Rate stable. Was on low dose of Eliquis for afib due to prior GI bleed but not taking. Given PE back on Eliquis, plan to reduce dose 9/7.   4. Chronic systolic CHF - Not on GDMT due to chronic hypotension and ESRD - Volume managed by dialysis   5. ESRD on HD  Will sign off and arrange follow up.   For questions or updates, please contact [place] Please consult  [email or web address] for contact info under        SignedLeanor Kail, PA  09/08/2021, 9:39 AM      Attending Note:   The patient was seen and examined.  Agree with assessment and plan as noted above.  Changes  made to the above note as needed.  Patient seen and independently examined with Robbie Lis, PA .   We discussed all aspects of the encounter. I agree with the assessment and plan as stated above.     PAF :   stable .   Currenlty on eliquis   2.  PVCs:   will continue to monitor  3. ESRD:   on HD   4  chronic systolic CHF:  our medical therapy is limited by his hypotension  Volume is managed by nephrology / dialysis   5.  Pulmonary embolus :   currently on Eliquis 10 mg BID.  Will reduce dose to 5 mg BID on Sept. 7.   I have discussed the dosing of eliquis with him and his family.  They understand the dosing schedule     I have spent a total of 40 minutes with patient reviewing hospital  notes , telemetry, EKGs, labs and examining patient as well as establishing an assessment and plan that was discussed with the patient.  > 50% of time was spent in direct patient care.    Thayer Headings, Brooke Bonito., MD, [facility] 09/08/2021, 10:20 AM [address],  [place] Pager [phone]

## 2021-09-09 ENCOUNTER — Telehealth: Payer: Self-pay | Admitting: Nurse Practitioner

## 2021-09-09 NOTE — Telephone Encounter (Signed)
Transition of care contact from inpatient facility  Date of Discharge:  Date of Contact: Method of contact: Phone  Attempted to contact patient to discuss transition of care from inpatient admission. Patient did not answer the phone. Voice mail not set up. Unable to leave message.

## 2021-09-12 ENCOUNTER — Telehealth: Payer: Self-pay

## 2021-09-12 NOTE — Progress Notes (Deleted)
Cardiology Office Note:    Date:  09/12/2021   ID:  Nathaniel White, DOB [birth year], MRN [id number]  PCP:  Gildardo Pounds, NP  [facility] Cardiologist:  Lauree Chandler, MD { Click to update primary MD,subspecialty MD or APP then REFRESH:1}    Referring MD: Gildardo Pounds, NP   Chief Complaint:  No chief complaint on file. {Click here for Visit Info    :1}    History of Present Illness:   Nathaniel White is a 61 y.o. male with a hx of ESRD on HD, nonischemic cardiomyopathy (EF 35% in March 2023), pulmonary hypertension, RV failure, paroxysmal atrial fibrillation, prior stroke with left-sided weakness, PAD seen for NSVT/PVCs in setting of acute PE.  Patient discharged 09/08/21 after admission with Acute PE(had stopped eliquis for a month), likely unprovoked and was having frequent PVC's and NSVT and we were unable to add BB due to chronic hypotension requiring midodrine. Was on low dose eliquis for afib due to prior GI bleed but had a PE and back on regular dose eliquis with plans to reduce dose 09/14/21.  Echo showed mod to severe MR and TR. Consider TEE for further evaluation of valvular disease. Can be completed as an outpatient, should be timed with HD so patient is closer to euvolemic            Past Medical History:  Diagnosis Date   Anemia    Anxiety    Asthma    CHF (congestive heart failure) ([place])    Complication from renal dialysis device 50/53/9767   Complication of anesthesia 2017   according to pt and spouse pt was moving around and cough while under and pt had difficulty waking up so the anesthesia had to be reversed. and pt admitted to ICU.    COPD (chronic obstructive pulmonary disease) ([facility])    Diabetes mellitus without complication ([place])    Type II - Patient states he does not have diabetes, "they said sometimes when you start dialysis you don't have diabetes any longer"    ESOPHAGEAL VARICES 10/04/2008   Qualifier: Diagnosis of  By: Fuller Plan  MD Lamont Snowball T    ESRD    on HD, T-TH-Sat - [place]   Hemiparesis due to old stroke [facility])    left   Hypertension    Hx, not current problems, no meds   LV dysfunction    EF 25-30% by echo 07/2011   Memory loss due to medical condition    due to stroke   MR (mitral regurgitation)    moderate to severe, echo 07/2011   Peripheral vascular disease ([place])    Shortness of breath    with exertion   Stroke ([place])    TIA's-left sided weakness   Tobacco abuse    Current Medications: No outpatient medications have been marked as taking for the 09/13/21 encounter (Appointment) with Imogene Burn, PA-C.    Allergies:   Codeine, Dextromethorphan-guaifenesin, Iron dextran, and Morphine and related   Social History   Tobacco Use   Smoking status: Former    Packs/day: 0.50    Years: 15.00    Total pack years: 7.50    Types: Cigarettes    Quit date: 2014    Years since quitting: 9.6   Smokeless tobacco: Never  Vaping Use   Vaping Use: Never used  Substance Use Topics   Alcohol use: No    Alcohol/week: 0.0 standard drinks of alcohol   Drug use:  No    Family Hx: The patient's family history includes CAD in his paternal uncle; Cancer in his paternal grandfather; Diabetes in his paternal grandmother; Hypertension in his mother; Varicose Veins in his mother.  ROS   EKGs/Labs/Other Test Reviewed:    EKG:  EKG is *** ordered today.  The ekg ordered today demonstrates ***  Recent Labs: 09/04/2021: B Natriuretic Peptide 2,911.4 09/08/2021: ALT 8; BUN 32; Creatinine, Ser 6.46; Hemoglobin 11.7; Magnesium 2.0; Platelets 78; Potassium 4.3; Sodium 135   Recent Lipid Panel Recent Labs    09/07/21 0329  CHOL 97  TRIG 44  HDL 51  VLDL 9  LDLCALC 37     Prior CV Studies: {Select studies to display:26339}  Echocardiogram 09/05/2021  1. Left ventricular ejection fraction, by estimation, is 30 to 35%. The  left ventricle has moderately decreased function. The left ventricle   demonstrates global hypokinesis. There is severe concentric left  ventricular hypertrophy. Left ventricular  diastolic parameters are indeterminate.   2. Right ventricular systolic function is moderately reduced. The right  ventricular size is severely enlarged. There is severely elevated  pulmonary artery systolic pressure. The estimated right ventricular  systolic pressure is 83.6 mmHg.   3. Left atrial size was severely dilated.   4. Right atrial size was mildly dilated.   5. Regurgitant volume 42 ml, No right sided pulmonary vein systolic  blunting. There is a mixed etiology of mitral regurgitation: posterior  restriction and functional MR. Calcified mitral valve with at least mild  mitral regurgitation. The mitral valve is   degenerative. Moderate to severe mitral valve regurgitation. Mild mitral  stenosis. The mean mitral valve gradient is 5.0 mmHg with average heart  rate of 79 bpm. Moderate mitral annular calcification.   6. Tricuspid valve regurgitation is moderate to severe.   7. The aortic valve is tricuspid. There is mild calcification of the  aortic valve. There is mild thickening of the aortic valve. Aortic valve  regurgitation is not visualized. Aortic valve sclerosis is present, with  no evidence of aortic valve stenosis.   Comparison(s): Prior images reviewed side by side. Increase in mitral  valve and tricuspid valve regurgitation from prior. When patient is at dry  weight, consideration of TEE for characterization of MR/MS is reasonable  (inpatient vs outpatient).       Risk Assessment/Calculations/Metrics:   {Does this patient have ATRIAL FIBRILLATION?[phone]}     No BP recorded.  {Refresh Note OR Click here to enter BP  :1}***    Physical Exam:    VS:  There were no vitals taken for this visit.    Wt Readings from Last 3 Encounters:  09/08/21 160 lb 14.4 oz (73 kg)  08/18/21 170 lb (77.1 kg)  07/10/21 167 lb 8.8 oz (76 kg)    Physical Exam  GEN:  Well nourished, well developed, in no acute distress  HEENT: normal  Neck: no JVD, carotid bruits, or masses Cardiac:RRR; no murmurs, rubs, or gallops  Respiratory:  clear to auscultation bilaterally, normal work of breathing GI: soft, nontender, nondistended, + BS Ext: without cyanosis, clubbing, or edema, Good distal pulses bilaterally MS: no deformity or atrophy  Skin: warm and dry, no rash Neuro:  Alert and Oriented x 3, Strength and sensation are intact Psych: euthymic mood, full affect       ASSESSMENT & PLAN:   No problem-specific Assessment & Plan notes found for this encounter.    NSVT/Frequent PVCs - Frequency of NSVT improved. Continues to  have frequent PVCs and ventricular trigeminy.  - Unable to add BB due to chronic hypotension requiring midodrine  - Keep K > 4 and Mg > 2 - Felt due to pulmonary edema  - could consider monitor at outpatient to quantity burden or start low dose amiodarone.     PAF - Rate stable. Was on low dose of Eliquis for afib due to prior GI bleed but not taking. Given PE back on Eliquis, plan to reduce dose 9/7.   Chronic systolic CHF - Not on GDMT due to chronic hypotension and ESRD - Volume managed by dialysis   ESRD on HD  Moderate-severe MR/TR on echo-consider TEE as OP timed with HD so patient is euvolemic.        {Are you ordering a CV Procedure (e.g. stress test, cath, DCCV, TEE, etc)?   Press F2        :[id number]}   Dispo:  No follow-ups on file.   Medication Adjustments/Labs and Tests Ordered: Current medicines are reviewed at length with the patient today.  Concerns regarding medicines are outlined above.  Tests Ordered: No orders of the defined types were placed in this encounter.  Medication Changes: No orders of the defined types were placed in this encounter.  Sumner Boast, PA-C  09/12/2021 8:44 AM    [address], [place], [place]  [address] Phone: [phone]; Fax: [phone]

## 2021-09-12 NOTE — Telephone Encounter (Signed)
Transition Care Management Unsuccessful Follow-up Telephone Call  Date of discharge and from where:  09/08/2021, [facility]  Attempts:  1st Attempt  Reason for unsuccessful TCM follow-up call:  Left voice message- the person who answered the phone said that the patient would need to call me back. When I asked who I was speaking with the person hung up.

## 2021-09-13 ENCOUNTER — Telehealth: Payer: Self-pay

## 2021-09-13 ENCOUNTER — Encounter (HOSPITAL_COMMUNITY): Payer: Self-pay | Admitting: Emergency Medicine

## 2021-09-13 ENCOUNTER — Ambulatory Visit: Payer: Medicare Other | Admitting: Physician Assistant

## 2021-09-13 ENCOUNTER — Emergency Department (HOSPITAL_COMMUNITY): Payer: Medicare Other

## 2021-09-13 ENCOUNTER — Inpatient Hospital Stay (HOSPITAL_COMMUNITY)
Admission: EM | Admit: 2021-09-13 | Discharge: 2021-09-17 | DRG: 640 | Disposition: A | Discharge status: Home / Self Care | Payer: Medicare Other | Attending: Internal Medicine | Admitting: Internal Medicine

## 2021-09-13 DIAGNOSIS — E1151 Type 2 diabetes mellitus with diabetic peripheral angiopathy without gangrene: Secondary | ICD-10-CM | POA: Diagnosis present

## 2021-09-13 DIAGNOSIS — I2699 Other pulmonary embolism without acute cor pulmonale: Secondary | ICD-10-CM | POA: Diagnosis present

## 2021-09-13 DIAGNOSIS — Z833 Family history of diabetes mellitus: Secondary | ICD-10-CM

## 2021-09-13 DIAGNOSIS — Z86711 Personal history of pulmonary embolism: Secondary | ICD-10-CM

## 2021-09-13 DIAGNOSIS — I5023 Acute on chronic systolic (congestive) heart failure: Secondary | ICD-10-CM | POA: Diagnosis not present

## 2021-09-13 DIAGNOSIS — I272 Pulmonary hypertension, unspecified: Secondary | ICD-10-CM | POA: Diagnosis present

## 2021-09-13 DIAGNOSIS — F419 Anxiety disorder, unspecified: Secondary | ICD-10-CM | POA: Diagnosis present

## 2021-09-13 DIAGNOSIS — N186 End stage renal disease: Secondary | ICD-10-CM | POA: Diagnosis present

## 2021-09-13 DIAGNOSIS — D696 Thrombocytopenia, unspecified: Secondary | ICD-10-CM | POA: Diagnosis present

## 2021-09-13 DIAGNOSIS — D509 Iron deficiency anemia, unspecified: Secondary | ICD-10-CM | POA: Diagnosis present

## 2021-09-13 DIAGNOSIS — R34 Anuria and oliguria: Secondary | ICD-10-CM | POA: Diagnosis present

## 2021-09-13 DIAGNOSIS — J449 Chronic obstructive pulmonary disease, unspecified: Secondary | ICD-10-CM | POA: Diagnosis present

## 2021-09-13 DIAGNOSIS — D72819 Decreased white blood cell count, unspecified: Secondary | ICD-10-CM | POA: Diagnosis present

## 2021-09-13 DIAGNOSIS — D631 Anemia in chronic kidney disease: Secondary | ICD-10-CM | POA: Diagnosis present

## 2021-09-13 DIAGNOSIS — I69354 Hemiplegia and hemiparesis following cerebral infarction affecting left non-dominant side: Secondary | ICD-10-CM

## 2021-09-13 DIAGNOSIS — Z20822 Contact with and (suspected) exposure to covid-19: Secondary | ICD-10-CM | POA: Diagnosis present

## 2021-09-13 DIAGNOSIS — Z79899 Other long term (current) drug therapy: Secondary | ICD-10-CM

## 2021-09-13 DIAGNOSIS — D638 Anemia in other chronic diseases classified elsewhere: Secondary | ICD-10-CM | POA: Diagnosis present

## 2021-09-13 DIAGNOSIS — T8611 Kidney transplant rejection: Secondary | ICD-10-CM | POA: Diagnosis present

## 2021-09-13 DIAGNOSIS — I480 Paroxysmal atrial fibrillation: Secondary | ICD-10-CM | POA: Diagnosis present

## 2021-09-13 DIAGNOSIS — R778 Other specified abnormalities of plasma proteins: Secondary | ICD-10-CM | POA: Diagnosis present

## 2021-09-13 DIAGNOSIS — E8770 Fluid overload, unspecified: Secondary | ICD-10-CM | POA: Diagnosis not present

## 2021-09-13 DIAGNOSIS — J441 Chronic obstructive pulmonary disease with (acute) exacerbation: Secondary | ICD-10-CM | POA: Diagnosis present

## 2021-09-13 DIAGNOSIS — E876 Hypokalemia: Secondary | ICD-10-CM | POA: Diagnosis present

## 2021-09-13 DIAGNOSIS — I132 Hypertensive heart and chronic kidney disease with heart failure and with stage 5 chronic kidney disease, or end stage renal disease: Secondary | ICD-10-CM | POA: Diagnosis present

## 2021-09-13 DIAGNOSIS — Z885 Allergy status to narcotic agent status: Secondary | ICD-10-CM

## 2021-09-13 DIAGNOSIS — I5020 Unspecified systolic (congestive) heart failure: Secondary | ICD-10-CM | POA: Diagnosis present

## 2021-09-13 DIAGNOSIS — Z9981 Dependence on supplemental oxygen: Secondary | ICD-10-CM

## 2021-09-13 DIAGNOSIS — Z89422 Acquired absence of other left toe(s): Secondary | ICD-10-CM

## 2021-09-13 DIAGNOSIS — Z8249 Family history of ischemic heart disease and other diseases of the circulatory system: Secondary | ICD-10-CM

## 2021-09-13 DIAGNOSIS — I081 Rheumatic disorders of both mitral and tricuspid valves: Secondary | ICD-10-CM | POA: Diagnosis present

## 2021-09-13 DIAGNOSIS — Z888 Allergy status to other drugs, medicaments and biological substances status: Secondary | ICD-10-CM

## 2021-09-13 DIAGNOSIS — Z7951 Long term (current) use of inhaled steroids: Secondary | ICD-10-CM

## 2021-09-13 DIAGNOSIS — J9611 Chronic respiratory failure with hypoxia: Secondary | ICD-10-CM | POA: Diagnosis present

## 2021-09-13 DIAGNOSIS — I2781 Cor pulmonale (chronic): Secondary | ICD-10-CM | POA: Diagnosis present

## 2021-09-13 DIAGNOSIS — E1122 Type 2 diabetes mellitus with diabetic chronic kidney disease: Secondary | ICD-10-CM | POA: Diagnosis present

## 2021-09-13 DIAGNOSIS — Z79891 Long term (current) use of opiate analgesic: Secondary | ICD-10-CM

## 2021-09-13 DIAGNOSIS — Z992 Dependence on renal dialysis: Secondary | ICD-10-CM

## 2021-09-13 DIAGNOSIS — Z7901 Long term (current) use of anticoagulants: Secondary | ICD-10-CM

## 2021-09-13 DIAGNOSIS — Z87891 Personal history of nicotine dependence: Secondary | ICD-10-CM

## 2021-09-13 DIAGNOSIS — Z89022 Acquired absence of left finger(s): Secondary | ICD-10-CM

## 2021-09-13 DIAGNOSIS — M898X9 Other specified disorders of bone, unspecified site: Secondary | ICD-10-CM | POA: Diagnosis present

## 2021-09-13 DIAGNOSIS — I509 Heart failure, unspecified: Principal | ICD-10-CM

## 2021-09-13 HISTORY — DX: Paroxysmal atrial fibrillation: I48.0

## 2021-09-13 LAB — TROPONIN I (HIGH SENSITIVITY)
Troponin I (High Sensitivity): 74 ng/L — ABNORMAL HIGH (ref <18)
Troponin I (High Sensitivity): 75 ng/L — ABNORMAL HIGH (ref <18)

## 2021-09-13 LAB — CBC
HCT: 37.9 % — ABNORMAL LOW (ref 39.0–52.0)
Hemoglobin: 11.7 g/dL — ABNORMAL LOW (ref 13.0–17.0)
MCH: 28.5 pg (ref 26.0–34.0)
MCHC: 30.9 g/dL (ref 30.0–36.0)
MCV: 92.2 fL (ref 80.0–100.0)
Platelets: 85 10*3/uL — ABNORMAL LOW (ref 150–400)
RBC: 4.11 MIL/uL — ABNORMAL LOW (ref 4.22–5.81)
RDW: 18.6 % — ABNORMAL HIGH (ref 11.5–15.5)
WBC: 3.4 10*3/uL — ABNORMAL LOW (ref 4.0–10.5)
nRBC: 0 % (ref 0.0–0.2)

## 2021-09-13 LAB — COMPREHENSIVE METABOLIC PANEL
ALT: 11 U/L (ref 0–44)
AST: 18 U/L (ref 15–41)
Albumin: 3.3 g/dL — ABNORMAL LOW (ref 3.5–5.0)
Alkaline Phosphatase: 66 U/L (ref 38–126)
Anion gap: 16 — ABNORMAL HIGH (ref 5–15)
BUN: 45 mg/dL — ABNORMAL HIGH (ref 6–20)
CO2: 26 mmol/L (ref 22–32)
Calcium: 9.7 mg/dL (ref 8.9–10.3)
Chloride: 99 mmol/L (ref 98–111)
Creatinine, Ser: 8.77 mg/dL — ABNORMAL HIGH (ref 0.61–1.24)
GFR, Estimated: 6 mL/min — ABNORMAL LOW (ref >60)
Glucose, Bld: 99 mg/dL (ref 70–99)
Potassium: 4 mmol/L (ref 3.5–5.1)
Sodium: 141 mmol/L (ref 135–145)
Total Bilirubin: 1 mg/dL (ref 0.3–1.2)
Total Protein: 8 g/dL (ref 6.5–8.1)

## 2021-09-13 LAB — BRAIN NATRIURETIC PEPTIDE: B Natriuretic Peptide: 3508.3 pg/mL — ABNORMAL HIGH (ref 0.0–100.0)

## 2021-09-13 MED ORDER — FUROSEMIDE 10 MG/ML IJ SOLN: 40.0000 mg | Freq: Once | INTRAMUSCULAR | Status: DC

## 2021-09-13 MED ORDER — APIXABAN (ELIQUIS) VTE STARTER PACK (10MG AND 5MG): 10.0000 mg | ORAL_TABLET | Freq: Two times a day (BID) | ORAL | Status: DC

## 2021-09-13 MED ORDER — ACETAMINOPHEN 325 MG PO TABS
650.0000 mg | ORAL_TABLET | Freq: Four times a day (QID) | ORAL | Status: DC | PRN
  Administered 2021-09-17: 650 mg via ORAL
  Filled 2021-09-13: qty 2

## 2021-09-13 MED ORDER — ACETAMINOPHEN 650 MG RE SUPP: 650.0000 mg | Freq: Four times a day (QID) | RECTAL | Status: DC | PRN

## 2021-09-13 MED ORDER — IPRATROPIUM-ALBUTEROL 0.5-2.5 (3) MG/3ML IN SOLN
3.0000 mL | Freq: Once | RESPIRATORY_TRACT | Status: AC
  Administered 2021-09-13: 3 mL via RESPIRATORY_TRACT
  Filled 2021-09-13: qty 3

## 2021-09-13 NOTE — ED Provider Notes (Signed)
[facility] EMERGENCY DEPARTMENT Provider Note   CSN: [id number] Arrival date & time: 09/13/21  1554     History  Chief Complaint  Patient presents with   Shortness of Breath    Nathaniel White is a 61 y.o. male with a past medical history of ESRD on Tuesday, Thursday, Saturday dialysis, CVA and CHF presenting today with shortness of breath.  Says he is felt short of breath since Monday but denies any chest pain.  Says that he did not feel any better after dialysis on Tuesday.  He has not missed any sessions.  Says that his inhaler sometimes helps him.  Also has noted some lower extremity swelling bilaterally.  Was discharged from the hospital a week ago after being diagnosed with PE.  Reports compliance with his Eliquis over the past week.    Shortness of Breath Associated symptoms: no chest pain, no cough and no fever        Home Medications Prior to Admission medications   Medication Sig Start Date End Date Taking? Authorizing Provider  acetaminophen (TYLENOL) 500 MG tablet Take 1 tablet (500 mg total) by mouth every 6 (six) hours as needed. Patient not taking: Reported on 08/09/2021 04/11/18   Zigmund Gottron, NP  albuterol [facility] HFA) 108 (90 Base) MCG/ACT inhaler Inhale 2 puffs into the lungs every 6 (six) hours as needed for wheezing or shortness of breath. Patient taking differently: Inhale 1-2 puffs into the lungs daily as needed for wheezing or shortness of breath. 08/25/21   Charlott Rakes, MD  albuterol (PROVENTIL) (2.5 MG/3ML) 0.083% nebulizer solution Take 3 mLs (2.5 mg total) by nebulization every 6 (six) hours as needed for wheezing or shortness of breath. Patient taking differently: Take 2.5 mg by nebulization 2 (two) times daily as needed for wheezing or shortness of breath. 08/25/21   Charlott Rakes, MD  APIXABAN [facility]ne [place]) VTE STARTER PACK ('10MG'$  AND '5MG'$ ) Take as directed on package: start with two-'5mg'$  tablets twice daily for 7 days. On day 8,  switch to one-'5mg'$  tablet twice daily. 09/08/21   Allie Bossier, MD  docusate sodium (COLACE) 100 MG capsule Take 1 capsule (100 mg total) by mouth 2 (two) times daily. Patient taking differently: Take 100 mg by mouth 2 (two) times daily as needed for mild constipation. 03/15/21   Sheikh, Omair Latif, DO  fluticasone-salmeterol (ADVAIR DISKUS) 250-50 MCG/ACT AEPB Inhale 1 puff into the lungs in the morning and at bedtime. NEEDS PASS Patient taking differently: Inhale 1 puff into the lungs daily. 08/09/21   Gildardo Pounds, NP  hydrOXYzine (ATARAX) 10 MG tablet Take 1 tablet (10 mg total) by mouth 3 (three) times daily as needed. For anxiety Patient taking differently: Take 10 mg by mouth as needed for anxiety. 06/07/21   Gildardo Pounds, NP  midodrine (PROAMATINE) 5 MG tablet Take 1 tablet (5 mg total) by mouth 3 (three) times daily with meals. 09/08/21   Allie Bossier, MD  montelukast (SINGULAIR) 10 MG tablet Take 1 tablet (10 mg total) by mouth at bedtime. Patient taking differently: Take 10 mg by mouth daily. 06/07/21   Gildardo Pounds, NP  pantoprazole (PROTONIX) 40 MG tablet Take 1 tablet (40 mg total) by mouth daily. Patient not taking: Reported on 08/09/2021 06/23/21 06/23/22  Elodia Florence., MD  traZODone (DESYREL) 50 MG tablet Take 1 tablet (50 mg total) by mouth at bedtime as needed for sleep. 09/08/21   Allie Bossier, MD  VELPHORO 500 MG chewable tablet Chew 1,000 mg by mouth 2 (two) times daily after a meal. 02/06/21   [provider]  VISINE 0.05 % ophthalmic solution Place 4 drops into both eyes daily. 07/04/21   [provider]      Allergies    Codeine, Dextromethorphan-guaifenesin, Iron dextran, and Morphine and related    Review of Systems   Review of Systems  Constitutional:  Negative for fever.  HENT:  Negative for congestion.   Respiratory:  Positive for shortness of breath. Negative for cough.   Cardiovascular:  Negative for chest pain and palpitations.     Physical Exam Updated Vital Signs BP (!) 120/95 (BP Location: Right Arm)   Pulse 72   Temp 98 F (36.7 C) (Oral)   Resp 20   Ht '5\' 9"'$  (1.753 m)   SpO2 100%   BMI 23.76 kg/m  Physical Exam Vitals and nursing note reviewed.  Constitutional:      Appearance: Normal appearance.  HENT:     Head: Normocephalic and atraumatic.  Eyes:     General: No scleral icterus.    Conjunctiva/sclera: Conjunctivae normal.  Cardiovascular:     Rate and Rhythm: Normal rate. Rhythm irregular.  Pulmonary:     Effort: Pulmonary effort is normal. No respiratory distress.     Breath sounds: Decreased breath sounds present.  Skin:    Findings: No rash.  Neurological:     Mental Status: He is alert.  Psychiatric:        Mood and Affect: Mood normal.     ED Results / Procedures / Treatments   Labs (all labs ordered are listed, but only abnormal results are displayed) Labs Reviewed  CBC - Abnormal; Notable for the following components:      Result Value   WBC 3.4 (*)    RBC 4.11 (*)    Hemoglobin 11.7 (*)    HCT 37.9 (*)    RDW 18.6 (*)    Platelets 85 (*)    All other components within normal limits  COMPREHENSIVE METABOLIC PANEL - Abnormal; Notable for the following components:   BUN 45 (*)    Creatinine, Ser 8.77 (*)    Albumin 3.3 (*)    GFR, Estimated 6 (*)    Anion gap 16 (*)    All other components within normal limits  BRAIN NATRIURETIC PEPTIDE - Abnormal; Notable for the following components:   B Natriuretic Peptide 3,508.3 (*)    All other components within normal limits  TROPONIN I (HIGH SENSITIVITY) - Abnormal; Notable for the following components:   Troponin I (High Sensitivity) 74 (*)    All other components within normal limits  TROPONIN I (HIGH SENSITIVITY) - Abnormal; Notable for the following components:   Troponin I (High Sensitivity) 75 (*)    All other components within normal limits    EKG EKG Interpretation  Date/Time:  Wednesday September 13 2021  16:11:37 EDT Ventricular Rate:  73 PR Interval:    QRS Duration: 126 QT Interval:  452 QTC Calculation: 497 R Axis:   -42 Text Interpretation: Undetermined rhythm Left axis deviation Non-specific intra-ventricular conduction block Minimal voltage criteria for LVH, may be normal variant ( Cornell product ) Cannot rule out Septal infarct , age undetermined Lateral infarct , age undetermined Abnormal ECG Confirmed by Ripley Fraise [id number]) on 09/13/2021 11:18:08 PM  Radiology DG Chest 2 View  Result Date: 09/13/2021 CLINICAL DATA:  SOB -  crackles in left base EXAM: CHEST -  2 VIEW COMPARISON:  Chest x-ray 09/04/2021, CT angiography chest 09/04/2021 FINDINGS: Left chest wall dialysis catheter with tip overlying the expected region of the superior cavoatrial junction. Cardiomegaly. The heart and mediastinal contours are unchanged. Atherosclerotic plaque. Persistent patchy right lower lung zone airspace opacity. Persistently increased interstitial markings. Persistent trace volume right pleural effusion. Redemonstration of pleural calcification on the right. No pneumothorax. No acute osseous abnormality. IMPRESSION: 1. Cardiomegaly with mild pulmonary edema. 2. 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 Followup PA and lateral chest X-ray is recommended in 3-4 weeks following therapy to ensure resolution and exclude underlying malignancy. 3.  Aortic Atherosclerosis (ICD10-I70.0). Electronically Signed   By: Iven Finn M.D.   On: 09/13/2021 17:35    Procedures Procedures   Medications Ordered in ED Medications  ipratropium-albuterol (DUONEB) 0.5-2.5 (3) MG/3ML nebulizer solution 3 mL (has no administration in time range)    ED Course/ Medical Decision Making/ A&P                           Medical Decision Making Risk Prescription drug  management. Decision regarding hospitalization.   This is a 62 year old male recently diagnosed with a PE who presents to the ED for concern of shortness of breath.  Differential includes but is not limited to PE, pneumothorax, pulmonary effusion, ACS, pulmonary edema.   This is not an exhaustive differential.    Past Medical History / Co-morbidities / Social History: ESRD on HD T/TH/S, CHF   Additional history: Per internal chart review, patient was admitted to the hospital 8/28 with a right lower lobe PE.  He was on a heparin drip while in the hospital and was discharged on Eliquis.  He reports being compliant with this.  At the time his chronic systolic heart failure was well controlled.  He did have a history of paroxysmal A-fib and had not been compliant with his Eliquis prior to this pulmonary embolus 1 week ago.  Echo during his hospitalization showed an EF of 35%  Physical Exam: Pertinent physical exam findings include Distant lung sounds Irregular rhythm  Lab Tests: I ordered, and personally interpreted labs.  The pertinent results include: Troponin 1&2 stable around 75, which is patient's baseline BNP 3508.3, up from 2911 9 days ago Hemoglobin baseline Creatinine 8.7, around patient's baseline prior to dialysis tomorrow   Imaging Studies: Chest x-ray ordered in triage, I visualized and interpreted this and I agree with the radiologist that there is pulmonary edema.  Also a small right-sided pleural effusion that was present a week ago.     Medications: I ordered medication including duoneb. Pending at this time.   Consultations Obtained: I spoke with Dr. Clover Mealy with nephrology and they will dialyze him during his hospitalization.   MDM/Disposition: This is a 61 year old male recently discharged from the hospital after being diagnosed with a PE who also has congestive heart failure presenting today with shortness of breath.  Question etiology from known PE versus  heart failure.  BNP is elevated from patient's baseline and discharged 9 days ago.  Believe he needs to be admitted and will have dialysis tomorrow and hopefully shortness of breath will improve.    Final Clinical Impression(s) / ED Diagnoses Final diagnoses:  Acute on chronic congestive heart failure, unspecified heart failure type ([place])    Rx / DC Orders  Admit to Dr. Velia Meyer  Darliss Ridgel 09/13/21 2338    Ripley Fraise, MD 09/14/21 [phone]

## 2021-09-13 NOTE — ED Triage Notes (Signed)
Pt states he has been having SOB since the last time he was here. Last HD yesterday.

## 2021-09-13 NOTE — Telephone Encounter (Signed)
Pt called to cancel his surgery scheduled for 09/18/21 due to just getting out of hospital and does not wish to r/s at this time. He is aware to call us back when he is ready to r/s.

## 2021-09-13 NOTE — ED Provider Triage Note (Signed)
Emergency Medicine Provider Triage Evaluation Note  DEMORRIS CHOYCE , a 61 y.o. male  was evaluated in triage.  Pt complains of SOB. States he normally wears oxygen 2L by [place] but didn't bring it with him. States he's felt SOB since he left the hospital last and states he's been taking his DOAC as prescribed. No NV no CP.  No episodes of LOC/syncope   Review of Systems  Positive: Fatigue, SOB Negative: Fever   Physical Exam  BP 94/75 (BP Location: Right Arm)   Pulse 71   Temp 97.8 F (36.6 C) (Oral)   Resp 18   Ht '5\' 9"'$  (1.753 m)   SpO2 92%   BMI 23.76 kg/m  Gen:   Awake, no distress   Resp:  Normal effort  MSK:   Moves extremities without difficulty  Other:  Crackles left base. Symmetric LEE  Medical Decision Making  Medically screening exam initiated at 4:40 PM.  Appropriate orders placed.  SOURISH ALLENDER was informed that the remainder of the evaluation will be completed by another provider, this initial triage assessment does not replace that evaluation, and the importance of remaining in the ED until their evaluation is complete.  CXR and labs.  Hx of PE but is on DOAC No CP  LEE - ?CHF exacerbation.    Pati Gallo [place], [place] 09/13/21 1645

## 2021-09-13 NOTE — H&P (Signed)
History and Physical    PLEASE NOTE THAT DRAGON DICTATION SOFTWARE WAS USED IN THE CONSTRUCTION OF THIS NOTE.   Nathaniel White JQB:[id number] DOB: 1960-10-08 DOA: 09/13/2021  PCP: Gildardo Pounds, NP  Patient coming from: home   I have personally briefly reviewed patient's old medical records in [place]  Chief Complaint: Shortness of breath  HPI: Nathaniel White is a 61 y.o. male with medical history significant for end-stage renal disease on hemodialysis Tuesday, Thursday, Saturday schedule, recent diagnosis of acute pulmonary embolism, chronic systolic heart failure, moderate to severe mitral regurgitation and tricuspid regurgitation, anemia of chronic kidney disease associated baseline hemoglobin range 8-12, COPD, ischemic CVA with residual left hemiparesis, chronically elevated troponin, chronic thrombocytopenia, paroxysmal atrial fibrillation, who is admitted to [facility] on 09/13/2021 with suspected acute on chronic systolic heart failure after presenting from home to [facility] ED complaining of shortness of breath.   The patient was recently hospitalized at [facility] from 09/04/21 to 09/08/21 for acute right lower lobe pulmonary embolism, during which time he was started on Eliquis.  TTE performed on 09/05/2021 demonstrated LVEF 30 to 35%, global left ventricular hypokinesis, indeterminate diastolic parameters, moderately reduced right ventricular systolic function, severely dilated left atrium, mildly dilated right atrium, moderate to severe mitral regurgitation as well as moderate to severe tricuspid regurgitation.  After being discharged home on 09/08/2021, the patient reports good interval compliance with Eliquis, but presents to the ED this evening complaining of 2 days of progressive shortness of breath starting on 09/11/2021 this has been associated with orthopnea and worsening edema in the bilateral lower extremities, in the absence of calf tenderness or new lower extremity  erythema.  Reports mild nonproductive cough in the absence of hemoptysis.  Denies any associated subjective fever, chills, rigors, or generalized myalgias.  Denies any recent melena or hematochezia.  No recent abdominal pain.  He confirms a history of end-stage renal disease disease on hemodialysis on Tuesday, Thursday, Saturday schedule, and notes no recently missed hemodialysis sessions.  He confirms most recently completed hemodialysis session on Tuesday, 09/12/2021, and notes mild improvement in his shortness of breath following this hemodialysis session, but conveys that his breathing did not return to baseline following this most recent HD session.  Confirms that he is an uric at baseline.  No recent chest pain, diaphoresis, palpitations, nausea, vomiting, presyncope, or syncope.  Has a history of chronically elevated high-sensitivity troponin I, with associated values since March 2023 noted to be in the range of 62-98, with most recent prior value noted to be 62 on 09/05/2021.      ED Course:  Vital signs in the ED were notable for the following: Afebrile; heart rate 70-77; blood pressure 120/95 - 133/78; respiratory rate 17-20, oxygen saturation 99 to 100% on 2 L nasal cannula, with the latter started for patient comfort in the absence of any documented objective hypoxia.  Labs were notable for the following: CMP notable for the following: Potassium 4.0, bicarbonate 26, liver enzymes within normal limits.  BNP 3500 compared to 2900 on 09/04/2021, and compared to 2600 on 06/20/2021; initial high-sensitivity troponin I noted to be 74, with repeat value trending up slightly to 75; CBC notable for white blood cell count 3400 compared to 3800 on 09/08/2021, hemoglobin 11.7, unchanged from 09/08/2021, and associated normocytic/normochromic findings, platelet count 85 compared to 78 on 09/08/2021.  COVID-19 PCR negative.  Imaging and additional notable ED work-up: EKG, which appears to show atrial fibrillation  with  frequent PVCs, ventricular rate 73, no overt evidence of T wave or ST changes, including no overt evidence of ST elevation.  2 view chest x-ray shows cardiomegaly with evidence of pulmonary edema, while showing stable trace right pleural effusion and right lower lobe airspace opacity suggestive of atelectasis versus infection.   EDP discussed the patient's case with the on-call nephrologist, Dr. Moshe Cipro, Who will consult and is planning for hemodialysis on the morning of 09/14/2021.    While in the ED, the following were administered: Duo nebulizer treatment x1.  Subsequently, the patient was admitted for observation for suspected acute on chronic systolic heart failure.       Review of Systems: As per HPI otherwise 10 point review of systems negative.   Past Medical History:  Diagnosis Date   Anemia    Anxiety    Asthma    CHF (congestive heart failure) ([place])    Complication from renal dialysis device 17/61/6073   Complication of anesthesia 2017   according to pt and spouse pt was moving around and cough while under and pt had difficulty waking up so the anesthesia had to be reversed. and pt admitted to ICU.    COPD (chronic obstructive pulmonary disease) (HCC)    Diabetes mellitus without complication ([place])    Type II - Patient states he does not have diabetes, "they said sometimes when you start dialysis you don't have diabetes any longer"    ESOPHAGEAL VARICES 10/04/2008   Qualifier: Diagnosis of  By: Fuller Plan MD Lamont Snowball T    ESRD    on HD, T-TH-Sat - [place]   Hemiparesis due to old stroke [facility])    left   Hypertension    Hx, not current problems, no meds   LV dysfunction    EF 25-30% by echo 07/2011   Memory loss due to medical condition    due to stroke   MR (mitral regurgitation)    moderate to severe, echo 07/2011   Peripheral vascular disease ([place])    Shortness of breath    with exertion   Stroke ([place])    TIA's-left sided weakness   Tobacco abuse      Past Surgical History:  Procedure Laterality Date   ABDOMINAL AORTOGRAM W/LOWER EXTREMITY Bilateral 07/21/2018   Procedure: ABDOMINAL AORTOGRAM W/LOWER EXTREMITY;  Surgeon: Waynetta Sandy, MD;  Location: [place] CV LAB;  Service: Cardiovascular;  Laterality: Bilateral;   AMPUTATION Left 11/05/2018   Procedure: AMPUTATION SECOND TOE LEFT FOOT;  Surgeon: Waynetta Sandy, MD;  Location: [place];  Service: Vascular;  Laterality: Left;   AMPUTATION Left 02/13/2021   Procedure: Left small finger AMPUTATION DIGIT;  Surgeon: Sherilyn Cooter, MD;  Location: [place];  Service: Orthopedics;  Laterality: Left;   AV FISTULA PLACEMENT     CARDIAC CATHETERIZATION     [facility]   COLONOSCOPY W/ BIOPSIES AND POLYPECTOMY     FISTULA SUPERFICIALIZATION Left 11/10/2013   Procedure: FISTULA PLICATION;  Surgeon: Serafina Mitchell, MD;  Location: [place];  Service: Vascular;  Laterality: Left;   INSERTION OF DIALYSIS CATHETER N/A 06/20/2021   Procedure: INSERTION OF DIALYSIS CATHETER;  Surgeon: Broadus John, MD;  Location: [facility] OR;  Service: Vascular;  Laterality: N/A;   KIDNEY TRANSPLANT     2011 rejected kidney 2012 back on dialysis   LEFT AND RIGHT HEART CATHETERIZATION WITH CORONARY ANGIOGRAM N/A 10/09/2013   Procedure: LEFT AND RIGHT HEART CATHETERIZATION WITH CORONARY ANGIOGRAM;  Surgeon: Burnell Blanks, MD;  Location: [place] CATH LAB;  Service: Cardiovascular;  Laterality: N/A;   LIGATION OF ARTERIOVENOUS  FISTULA Left 06/20/2021   Procedure: LIGATION OF ARTERIOVENOUS  FISTULA;  Surgeon: Broadus John, MD;  Location: [place];  Service: Vascular;  Laterality: Left;   PERIPHERAL VASCULAR INTERVENTION Left 07/21/2018   Procedure: PERIPHERAL VASCULAR INTERVENTION;  Surgeon: Waynetta Sandy, MD;  Location: [place] CV [facility];  Service: Cardiovascular;  Laterality: Left;  Popliteal   REVISON OF ARTERIOVENOUS FISTULA Left 08/26/2013   Procedure: EXCISION OF ERODED SKIN AND  EXPLORATION OF MAIN LEFT UPPER ARM AV FISTULA;  Surgeon: Rosetta Posner, MD;  Location: [place];  Service: Vascular;  Laterality: Left;   REVISON OF ARTERIOVENOUS FISTULA Left 02/05/2014   Procedure: REPAIR OF ARTERIOVENOUS FISTULA ANEURYSM;  Surgeon: Serafina Mitchell, MD;  Location[facility] OR;  Service: Vascular;  Laterality: Left;   REVISON OF ARTERIOVENOUS FISTULA Left 03/10/2021   Procedure: LEFT ARM REVISION OF ARTERIOVENOUS FISTULA WITH PLICATION;  Surgeon: Waynetta Sandy, MD;  Location: [place];  Service: Vascular;  Laterality: Left;   SHUNTOGRAM N/A 11/05/2012   Procedure: Fistulogram;  Surgeon: Serafina Mitchell, MD;  Location: [facility] CATH LAB;  Service: Cardiovascular;  Laterality: N/A;   TEE WITHOUT CARDIOVERSION  08/17/2011   Procedure: TRANSESOPHAGEAL ECHOCARDIOGRAM (TEE);  Surgeon: Larey Dresser, MD;  Location: [place];  Service: Cardiovascular;  Laterality: N/A;   ULTRASOUND GUIDANCE FOR VASCULAR ACCESS  06/20/2021   Procedure: ULTRASOUND GUIDANCE FOR VASCULAR ACCESS;  Surgeon: Broadus John, MD;  Location: [place];  Service: Vascular;;   VIDEO ASSISTED THORACOSCOPY (VATS)/DECORTICATION  08/10/11    Social History:  reports that he quit smoking about 9 years ago. His smoking use included cigarettes. He has a 7.50 pack-year smoking history. He has never used smokeless tobacco. He reports that he does not drink alcohol and does not use drugs.   Allergies  Allergen Reactions   Codeine Shortness Of Breath   Dextromethorphan-Guaifenesin Shortness Of Breath   Iron Dextran Shortness Of Breath   Morphine And Related Shortness Of Breath    Family History  Problem Relation Age of Onset   Hypertension Mother    Varicose Veins Mother    Diabetes Paternal Grandmother    Cancer Paternal Grandfather    CAD Paternal Uncle     Family history reviewed and not pertinent    Prior to Admission medications   Medication Sig Start Date End Date Taking? Authorizing Provider  acetaminophen  (TYLENOL) 500 MG tablet Take 1 tablet (500 mg total) by mouth every 6 (six) hours as needed. Patient not taking: Reported on 08/09/2021 04/11/18   Zigmund Gottron, NP  albuterol [facility] HFA) 108 (90 Base) MCG/ACT inhaler Inhale 2 puffs into the lungs every 6 (six) hours as needed for wheezing or shortness of breath. Patient taking differently: Inhale 1-2 puffs into the lungs daily as needed for wheezing or shortness of breath. 08/25/21   Charlott Rakes, MD  albuterol (PROVENTIL) (2.5 MG/3ML) 0.083% nebulizer solution Take 3 mLs (2.5 mg total) by nebulization every 6 (six) hours as needed for wheezing or shortness of breath. Patient taking differently: Take 2.5 mg by nebulization 2 (two) times daily as needed for wheezing or shortness of breath. 08/25/21   Charlott Rakes, MD  APIXABAN [place]) VTE STARTER PACK ('10MG'$  AND '5MG'$ ) Take as directed on package: start with two-'5mg'$  tablets twice daily for 7 days. On day 8, switch to one-'5mg'$  tablet twice daily. 09/08/21   Allie Bossier,  MD  docusate sodium (COLACE) 100 MG capsule Take 1 capsule (100 mg total) by mouth 2 (two) times daily. Patient taking differently: Take 100 mg by mouth 2 (two) times daily as needed for mild constipation. 03/15/21   Sheikh, Omair Latif, DO  fluticasone-salmeterol (ADVAIR DISKUS) 250-50 MCG/ACT AEPB Inhale 1 puff into the lungs in the morning and at bedtime. NEEDS PASS Patient taking differently: Inhale 1 puff into the lungs daily. 08/09/21   Gildardo Pounds, NP  hydrOXYzine (ATARAX) 10 MG tablet Take 1 tablet (10 mg total) by mouth 3 (three) times daily as needed. For anxiety Patient taking differently: Take 10 mg by mouth as needed for anxiety. 06/07/21   Gildardo Pounds, NP  midodrine (PROAMATINE) 5 MG tablet Take 1 tablet (5 mg total) by mouth 3 (three) times daily with meals. 09/08/21   Allie Bossier, MD  montelukast (SINGULAIR) 10 MG tablet Take 1 tablet (10 mg total) by mouth at bedtime. Patient taking differently: Take 10 mg by  mouth daily. 06/07/21   Gildardo Pounds, NP  pantoprazole (PROTONIX) 40 MG tablet Take 1 tablet (40 mg total) by mouth daily. Patient not taking: Reported on 08/09/2021 06/23/21 06/23/22  Elodia Florence., MD  traZODone (DESYREL) 50 MG tablet Take 1 tablet (50 mg total) by mouth at bedtime as needed for sleep. 09/08/21   Allie Bossier, MD  VELPHORO 500 MG chewable tablet Chew 1,000 mg by mouth 2 (two) times daily after a meal. 02/06/21   [provider]  VISINE 0.05 % ophthalmic solution Place 4 drops into both eyes daily. 07/04/21   [provider]     Objective    Physical Exam: Vitals:   09/13/21 1853 09/13/21 2108 09/13/21 2321 09/13/21 2337  BP: 95/78 (!) 120/95  133/78  Pulse: 70 72  77  Resp: '17 20  19  '$ Temp: 97.7 F (36.5 C) 98 F (36.7 C)    TempSrc: Oral Oral    SpO2: 93% 100%  99%  Weight:   77.1 kg   Height:   '5\' 9"'$  (1.753 m)     General: appears to be stated age; alert, oriented; mildly increased work of breathing noted. Skin: warm, dry, no rash Head:  AT/[place] Mouth:  Oral mucosa membranes appear moist, normal dentition Neck: supple; trachea midline Heart: Irregular; did not appreciate any M/R/G Lungs: CTAB, did not appreciate any wheezes, rales, or rhonchi Abdomen: + BS; soft, ND, NT Vascular: 2+ pedal pulses b/l; 2+ radial pulses b/l Extremities: 1-2+ edema in b/l LE's, no muscle wasting Neuro: sensation intact in upper and lower extremities b/l     Labs on Admission: I have personally reviewed following labs and imaging studies  CBC: Recent Labs  Lab 09/07/21 0329 09/08/21 0537 09/13/21 1710  WBC 3.0* 3.8* 3.4*  NEUTROABS 1.5* 2.1  --   HGB 10.5* 11.7* 11.7*  HCT 33.9* 38.6* 37.9*  MCV 91.6 92.8 92.2  PLT 67* 78* 85*   Basic Metabolic Panel: Recent Labs  Lab 09/07/21 0329 09/07/21 0823 09/08/21 0537 09/13/21 1710  NA 136 137 135 141  K 3.9 4.0 4.3 4.0  CL 99 96* 95* 99  CO2 '26 26 27 26  '$ GLUCOSE 114* 100* 103* 99  BUN  47* 50* 32* 45*  CREATININE 9.39* 9.87* 6.46* 8.77*  CALCIUM 9.3 9.6 9.7 9.7  MG 2.0  --  2.0  --   PHOS 6.7* 6.9* 5.4*  --    GFR: Estimated Creatinine Clearance:  9.1 mL/min (A) (by C-G formula based on SCr of 8.77 mg/dL (H)). Liver Function Tests: Recent Labs  Lab 09/07/21 0823 09/08/21 0537 09/13/21 1710  AST  --  15 18  ALT  --  8 11  ALKPHOS  --  81 66  BILITOT  --  0.6 1.0  PROT  --  7.8 8.0  ALBUMIN 2.6* 3.0* 3.3*   No results for input(s): "LIPASE", "AMYLASE" in the last 168 hours. No results for input(s): "AMMONIA" in the last 168 hours. Coagulation Profile: No results for input(s): "INR", "PROTIME" in the last 168 hours. Cardiac Enzymes: No results for input(s): "CKTOTAL", "CKMB", "CKMBINDEX", "TROPONINI" in the last 168 hours. BNP (last 3 results) No results for input(s): "PROBNP" in the last 8760 hours. HbA1C: No results for input(s): "HGBA1C" in the last 72 hours. CBG: Recent Labs  Lab 09/07/21 1320 09/07/21 1608 09/07/21 2107 09/08/21 0610 09/08/21 1117  GLUCAP 59* 99 121* 100* 97   Lipid Profile: No results for input(s): "CHOL", "HDL", "LDLCALC", "TRIG", "CHOLHDL", "LDLDIRECT" in the last 72 hours. Thyroid Function Tests: No results for input(s): "TSH", "T4TOTAL", "FREET4", "T3FREE", "THYROIDAB" in the last 72 hours. Anemia Panel: No results for input(s): "VITAMINB12", "FOLATE", "FERRITIN", "TIBC", "IRON", "RETICCTPCT" in the last 72 hours. Urine analysis:    Component Value Date/Time   COLORURINE YELLOW 02/13/2010 0703   APPEARANCEUR CLOUDY (A) 02/13/2010 0703   LABSPEC 1.013 02/13/2010 0703   PHURINE 8.5 (H) 02/13/2010 0703   GLUCOSEU NEGATIVE 12/18/2009 0546   HGBUR LARGE (A) 02/13/2010 0703   BILIRUBINUR NEGATIVE 02/13/2010 0703   KETONESUR NEGATIVE 02/13/2010 0703   PROTEINUR >300 (A) 02/13/2010 0703   UROBILINOGEN 0.2 02/13/2010 0703   NITRITE NEGATIVE 02/13/2010 0703   LEUKOCYTESUR NEGATIVE 02/13/2010 0703    Radiological Exams on  Admission: DG Chest 2 View  Result Date: 09/13/2021 CLINICAL DATA:  SOB -  crackles in left base EXAM: CHEST - 2 VIEW COMPARISON:  Chest x-ray 09/04/2021, CT angiography chest 09/04/2021 FINDINGS: Left chest wall dialysis catheter with tip overlying the expected region of the superior cavoatrial junction. Cardiomegaly. The heart and mediastinal contours are unchanged. Atherosclerotic plaque. Persistent patchy right lower lung zone airspace opacity. Persistently increased interstitial markings. Persistent trace volume right pleural effusion. Redemonstration of pleural calcification on the right. No pneumothorax. No acute osseous abnormality. IMPRESSION: 1. Cardiomegaly with mild pulmonary edema. 2. 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 Followup PA and lateral chest X-ray is recommended in 3-4 weeks following therapy to ensure resolution and exclude underlying malignancy. 3.  Aortic Atherosclerosis (ICD10-I70.0). Electronically Signed   By: Iven Finn M.D.   On: 09/13/2021 17:35     EKG: Independently reviewed, with result as described above.    Assessment/Plan   Principal Problem:   Acute on chronic systolic heart failure (HCC) Active Problems:   Paroxysmal atrial fibrillation (HCC)   ESRD (end stage renal disease) on dialysis ([place])   Acute pulmonary embolism (HCC)   Anemia of chronic disease   COPD (chronic obstructive pulmonary disease) ([place])      #) Acute on chronic systolic heart failure: In the context of a documented history of chronic systolic heart failure, which appears to be biventricular in nature, with most recent echocardiogram on 09/05/2021 showing LV EF 30 to 35% as well as moderately reduced right ventricular systolic function, suspect an element of acute exacerbation in the context of presenting  2 days of  progressive shortness of breath associated with orthopnea, and interval worsening of edema in the bilateral lower extremities along with interval increase in BNP, and interval development of pulmonary edema on chest x-ray.  Patient is anuric at baseline.  Specific etiology leading to this acute exacerbation is not entirely clear at this time, although it is likely that he possesses a delicate fluid balance in the setting of the biventricular nature of his chronic systolic heart failure.  Additionally, he is at risk for acute heart failure exacerbation given his moderate to severe mitral regurgitation and moderate to severe tricuspid regurgitation.  ACS is felt to be less likely in the absence of any recent chest pain, while high-sensitivity troponin I is consistent with degree of previously noted chronic elevation, while EKG shows no overt evidence of acute ischemic changes, including no evidence of STEMI.  Of note, EDP is discussed case with on-call nephrologist, Dr. Moshe Cipro, Who will consult and is helping to arrange for hemodialysis in the morning (9/7).   In terms of evaluating for any additional contributions to the patient's presenting shortness of breath, the patient conveys good compliance with interval Eliquis in the context of recently diagnosed acute pulmonary embolism.  We will also add on procalcitonin level to further evaluate right lower lobe airspace opacity, which is felt to favor atelectasis relative to infection.  He also has a history of COPD, although clinically, presentation appears less suggestive of acute COPD exacerbation at this time, although he is at increased risk for ensuing development of such due to physiologic stress stemming from primary acute on chronic systolic heart failure.    Plan: Nephrology consulted, plan for hemodialysis in the morning.  Monitor strict I's and O's and daily weights.  Repeat CMP in the morning.  Check serum magnesium level.  Monitor on telemetry.   Add on procalcitonin level.  Check VBG.  Incentive spirometry.  Continue all with [place] for recently diagnosed acute pulmonary embolism.  Prn albuterol nebulizer.            #) Recently diagnosed pulmonary embolism: Patient recently hospitalized for acute right lower lobe pulmonary embolism, for which she was started on Eliquis, noting good interval compliance on this medication.  I consulted inpatient pharmacist for assistance in clarifying timing of transition from dose of 10 mg p.o. twice daily to 5 mg p.o. twice daily, with resultant conveyance that the patient quires his final dose of 10 mg p.o. twice daily this evening (09/13/21), followed by his first dose of 5 mg p.o. twice daily on the morning of 09/14/2021.  He appears hemodynamically stable, without evidence of corresponding hypotension.  Plan: Eliquis 10 mg p.o. x1 dose now followed by Eliquis 5 mg p.o. twice daily, as further detailed above.  Repeat CBC in the morning.  Incentive spirometry.              #) ESRD: on HD (schedule:  T,Th,Sat).  Presentation appears to be associated with acute on chronic systolic heart failure in this patient who is anuric at baseline.  EDP discussed case with on-call nephrology, who will consult and arrange for hemodialysis in the morning, as further detailed above.  Of note, this presentation involving acute volume overload is occurred in the context of patient denied compliantly attending all recent hemodialysis sessions, with most recent hemodialysis session completed on Tuesday, on 09/12/2021.    Plan: monitor strict I's/O's, daily weights. CMP in the AM. Check mag and phos levels.  Nephrology consulted, with  plan for HD in the morning.  Continue home Velphoro.          #) Anemia of chronic kidney disease: Documented history of such, a/w with baseline hgb range 8-12, with presenting normocytic/normochromic hgb consistent with this range, in the absence of any overt evidence of active  bleed.     Plan: Repeat CBC in the morning.         #) COPD: Documented history of such in the setting of being a former smoker.  Clinically, presentation is suggestive of acute exacerbation thereof, but will closely monitor for ensuing development of evidence of acute exacerbation, as above.  Outpatient respiratory regimen includes scheduled Advair, which the patient reports he takes on a daily basis as opposed to twice daily.  He is also on prn albuterol inhaler as well as Singulair at home.  Plan: Continue outpatient Advair and Singulair.  Prn albuterol nebulizer.  Check VBG.  Check serum magnesium and phosphorus levels.             #) Paroxysmal atrial fibrillation: Documented history of such. In setting of CHA2DS2-VASc score of 6, there is an indication for chronic anticoagulation for thromboembolic prophylaxis.  Of note, will does not appear that the patient has been chronically anticoagulated, he was recently started on Eliquis during recent hospitalization for acute pulmonary embolism, as above, with good interval compliance with his medication.  Home AV nodal blocking regimen: None.  Most recent echocardiogram occurred on 09/05/2021, as further detailed above. Presenting EKG appears to show rate controlled atrial fibrillation with frequent PVCs as further detailed above.   Plan: monitor strict I's & O's and daily weights. Repeat CMP/CBC in AM. Check serum mag level. Continue home Eliquis.       DVT prophylaxis: SCD's   Code Status: Full code Family Communication: Patient's case was discussed with his friend, who is present at bedside Disposition Plan: Per Rounding Team Consults called: EDP discussed case with on-call nephrology, Dr. Moshe Cipro, As further detailed above. Admission status: Observation    PLEASE NOTE THAT DRAGON DICTATION SOFTWARE WAS USED IN THE CONSTRUCTION OF THIS NOTE.   Eugenio Saenz DO [facility]  From [place]   09/13/2021, 11:57 PM

## 2021-09-13 NOTE — Telephone Encounter (Signed)
Transition Care Management Follow-up Telephone Call Date of discharge and from where: 09/08/2021, [facility] How have you been since you were released from the hospital? He stated he is doing fine.  Any questions or concerns? No  Items Reviewed: Did the pt receive and understand the discharge instructions provided? Yes  Medications obtained and verified? Yes - he said he has all of his medications as well as a nebulizer and he did not have any questions about the med regime.  Other? No  Any new allergies since your discharge? No  Dietary orders reviewed? Yes Do you have support at home? Yes   Home Care and Equipment/Supplies: Were home health services ordered? yes If so, what is the name of the agency? [facility]  Has the agency set up a time to come to the patient's home? Not yet, they called him and said they would be calling him back  Were any new equipment or medical supplies ordered?  Yes: O2 What is the name of the medical supply agency? [employer] [facility] Were you able to get the supplies/equipment? yes Do you have any questions related to the use of the equipment or supplies? No  Functional Questionnaire: (I = Independent and D = Dependent) ADLs: ambulates with rollator. Independent with personal care.  He stated he is using the O2 most of the time and usually has it at 3L.  Attends HD: T/T/S at [facility]    Follow up appointments reviewed:  PCP Hospital f/u appt confirmed? Yes  Scheduled to see Geryl Rankins, NP 09/26/2021   Specialist Hospital f/u appt confirmed?  He was supposed to see cardiology this morning but he said he was not aware of that appointment.   He called [facility] this morning and cancelled his upcoming procedure scheduled for 09/18/2021 because he was just discharged from the hospital. He is aware that he needs to call them back to reschedule his procedure. Are transportation arrangements needed? No - he drives or a friend drives.  If their condition worsens, is the  pt aware to call PCP or go to the Emergency Dept.? Yes Was the patient provided with contact information for the PCP's office or ED? Yes Was to pt encouraged to call back with questions or concerns? Yes

## 2021-09-14 ENCOUNTER — Encounter (HOSPITAL_COMMUNITY): Payer: Self-pay | Admitting: Internal Medicine

## 2021-09-14 DIAGNOSIS — J449 Chronic obstructive pulmonary disease, unspecified: Secondary | ICD-10-CM | POA: Diagnosis present

## 2021-09-14 DIAGNOSIS — Z89422 Acquired absence of other left toe(s): Secondary | ICD-10-CM | POA: Diagnosis not present

## 2021-09-14 DIAGNOSIS — D72819 Decreased white blood cell count, unspecified: Secondary | ICD-10-CM | POA: Diagnosis present

## 2021-09-14 DIAGNOSIS — E1151 Type 2 diabetes mellitus with diabetic peripheral angiopathy without gangrene: Secondary | ICD-10-CM | POA: Diagnosis present

## 2021-09-14 DIAGNOSIS — I480 Paroxysmal atrial fibrillation: Secondary | ICD-10-CM | POA: Diagnosis present

## 2021-09-14 DIAGNOSIS — Z992 Dependence on renal dialysis: Secondary | ICD-10-CM | POA: Diagnosis not present

## 2021-09-14 DIAGNOSIS — I5023 Acute on chronic systolic (congestive) heart failure: Secondary | ICD-10-CM | POA: Diagnosis present

## 2021-09-14 DIAGNOSIS — E8770 Fluid overload, unspecified: Secondary | ICD-10-CM | POA: Diagnosis present

## 2021-09-14 DIAGNOSIS — N186 End stage renal disease: Secondary | ICD-10-CM | POA: Diagnosis present

## 2021-09-14 DIAGNOSIS — D509 Iron deficiency anemia, unspecified: Secondary | ICD-10-CM | POA: Diagnosis present

## 2021-09-14 DIAGNOSIS — D638 Anemia in other chronic diseases classified elsewhere: Secondary | ICD-10-CM | POA: Diagnosis not present

## 2021-09-14 DIAGNOSIS — M898X9 Other specified disorders of bone, unspecified site: Secondary | ICD-10-CM | POA: Diagnosis present

## 2021-09-14 DIAGNOSIS — T8611 Kidney transplant rejection: Secondary | ICD-10-CM | POA: Diagnosis present

## 2021-09-14 DIAGNOSIS — I272 Pulmonary hypertension, unspecified: Secondary | ICD-10-CM | POA: Diagnosis present

## 2021-09-14 DIAGNOSIS — D631 Anemia in chronic kidney disease: Secondary | ICD-10-CM | POA: Diagnosis present

## 2021-09-14 DIAGNOSIS — J441 Chronic obstructive pulmonary disease with (acute) exacerbation: Secondary | ICD-10-CM | POA: Diagnosis present

## 2021-09-14 DIAGNOSIS — I2781 Cor pulmonale (chronic): Secondary | ICD-10-CM | POA: Diagnosis present

## 2021-09-14 DIAGNOSIS — D696 Thrombocytopenia, unspecified: Secondary | ICD-10-CM | POA: Diagnosis present

## 2021-09-14 DIAGNOSIS — R34 Anuria and oliguria: Secondary | ICD-10-CM | POA: Diagnosis present

## 2021-09-14 DIAGNOSIS — I2699 Other pulmonary embolism without acute cor pulmonale: Secondary | ICD-10-CM | POA: Diagnosis not present

## 2021-09-14 DIAGNOSIS — Z20822 Contact with and (suspected) exposure to covid-19: Secondary | ICD-10-CM | POA: Diagnosis present

## 2021-09-14 DIAGNOSIS — I081 Rheumatic disorders of both mitral and tricuspid valves: Secondary | ICD-10-CM | POA: Diagnosis present

## 2021-09-14 DIAGNOSIS — E1122 Type 2 diabetes mellitus with diabetic chronic kidney disease: Secondary | ICD-10-CM | POA: Diagnosis present

## 2021-09-14 DIAGNOSIS — J9611 Chronic respiratory failure with hypoxia: Secondary | ICD-10-CM | POA: Diagnosis present

## 2021-09-14 DIAGNOSIS — I132 Hypertensive heart and chronic kidney disease with heart failure and with stage 5 chronic kidney disease, or end stage renal disease: Secondary | ICD-10-CM | POA: Diagnosis present

## 2021-09-14 DIAGNOSIS — I69354 Hemiplegia and hemiparesis following cerebral infarction affecting left non-dominant side: Secondary | ICD-10-CM | POA: Diagnosis not present

## 2021-09-14 LAB — CBC WITH DIFFERENTIAL/PLATELET
Abs Immature Granulocytes: 0.01 10*3/uL (ref 0.00–0.07)
Basophils Absolute: 0.1 10*3/uL (ref 0.0–0.1)
Basophils Relative: 3 %
Eosinophils Absolute: 0.3 10*3/uL (ref 0.0–0.5)
Eosinophils Relative: 9 %
HCT: 33.2 % — ABNORMAL LOW (ref 39.0–52.0)
Hemoglobin: 10 g/dL — ABNORMAL LOW (ref 13.0–17.0)
Immature Granulocytes: 0 %
Lymphocytes Relative: 28 %
Lymphs Abs: 0.9 10*3/uL (ref 0.7–4.0)
MCH: 28.4 pg (ref 26.0–34.0)
MCHC: 30.1 g/dL (ref 30.0–36.0)
MCV: 94.3 fL (ref 80.0–100.0)
Monocytes Absolute: 0.3 10*3/uL (ref 0.1–1.0)
Monocytes Relative: 9 %
Neutro Abs: 1.7 10*3/uL (ref 1.7–7.7)
Neutrophils Relative %: 51 %
Platelets: 71 10*3/uL — ABNORMAL LOW (ref 150–400)
RBC: 3.52 MIL/uL — ABNORMAL LOW (ref 4.22–5.81)
RDW: 18.6 % — ABNORMAL HIGH (ref 11.5–15.5)
WBC: 3.3 10*3/uL — ABNORMAL LOW (ref 4.0–10.5)
nRBC: 0 % (ref 0.0–0.2)

## 2021-09-14 LAB — I-STAT VENOUS BLOOD GAS, ED
Acid-Base Excess: 1 mmol/L (ref 0.0–2.0)
Bicarbonate: 25.3 mmol/L (ref 20.0–28.0)
Calcium, Ion: 0.95 mmol/L — ABNORMAL LOW (ref 1.15–1.40)
HCT: 31 % — ABNORMAL LOW (ref 39.0–52.0)
Hemoglobin: 10.5 g/dL — ABNORMAL LOW (ref 13.0–17.0)
O2 Saturation: 83 %
Potassium: 3.5 mmol/L (ref 3.5–5.1)
Sodium: 141 mmol/L (ref 135–145)
TCO2: 26 mmol/L (ref 22–32)
pCO2, Ven: 36.4 mmHg — ABNORMAL LOW (ref 44–60)
pH, Ven: 7.451 — ABNORMAL HIGH (ref 7.25–7.43)
pO2, Ven: 45 mmHg (ref 32–45)

## 2021-09-14 LAB — COMPREHENSIVE METABOLIC PANEL
ALT: 9 U/L (ref 0–44)
AST: 18 U/L (ref 15–41)
Albumin: 2.5 g/dL — ABNORMAL LOW (ref 3.5–5.0)
Alkaline Phosphatase: 56 U/L (ref 38–126)
Anion gap: 14 (ref 5–15)
BUN: 46 mg/dL — ABNORMAL HIGH (ref 6–20)
CO2: 21 mmol/L — ABNORMAL LOW (ref 22–32)
Calcium: 8 mg/dL — ABNORMAL LOW (ref 8.9–10.3)
Chloride: 104 mmol/L (ref 98–111)
Creatinine, Ser: 8.21 mg/dL — ABNORMAL HIGH (ref 0.61–1.24)
GFR, Estimated: 7 mL/min — ABNORMAL LOW (ref >60)
Glucose, Bld: 110 mg/dL — ABNORMAL HIGH (ref 70–99)
Potassium: 3.4 mmol/L — ABNORMAL LOW (ref 3.5–5.1)
Sodium: 139 mmol/L (ref 135–145)
Total Bilirubin: 0.5 mg/dL (ref 0.3–1.2)
Total Protein: 6.3 g/dL — ABNORMAL LOW (ref 6.5–8.1)

## 2021-09-14 LAB — MAGNESIUM: Magnesium: 1.9 mg/dL (ref 1.7–2.4)

## 2021-09-14 LAB — SARS CORONAVIRUS 2 BY RT PCR: SARS Coronavirus 2 by RT PCR: NEGATIVE

## 2021-09-14 LAB — PROCALCITONIN: Procalcitonin: 0.61 ng/mL

## 2021-09-14 LAB — PHOSPHORUS: Phosphorus: 6.6 mg/dL — ABNORMAL HIGH (ref 2.5–4.6)

## 2021-09-14 MED ORDER — CALCIUM ACETATE (PHOS BINDER) 667 MG PO CAPS
1334.0000 mg | ORAL_CAPSULE | Freq: Three times a day (TID) | ORAL | Status: DC
  Administered 2021-09-14 – 2021-09-17 (×7): 1334 mg via ORAL
  Filled 2021-09-14 (×7): qty 2

## 2021-09-14 MED ORDER — CHLORHEXIDINE GLUCONATE CLOTH 2 % EX PADS: 6.0000 | MEDICATED_PAD | Freq: Every day | CUTANEOUS | Status: DC

## 2021-09-14 MED ORDER — HEPARIN SODIUM (PORCINE) 1000 UNIT/ML IJ SOLN
INTRAMUSCULAR | Status: AC
  Filled 2021-09-14: qty 4

## 2021-09-14 MED ORDER — APIXABAN 5 MG PO TABS
5.0000 mg | ORAL_TABLET | Freq: Two times a day (BID) | ORAL | Status: DC
  Administered 2021-09-14 – 2021-09-17 (×6): 5 mg via ORAL
  Filled 2021-09-14 (×7): qty 1

## 2021-09-14 MED ORDER — SUCROFERRIC OXYHYDROXIDE 500 MG PO CHEW
1000.0000 mg | CHEWABLE_TABLET | Freq: Two times a day (BID) | ORAL | Status: DC
  Administered 2021-09-14 – 2021-09-17 (×4): 1000 mg via ORAL
  Filled 2021-09-14 (×8): qty 2

## 2021-09-14 MED ORDER — ALBUTEROL SULFATE (2.5 MG/3ML) 0.083% IN NEBU
2.5000 mg | INHALATION_SOLUTION | Freq: Two times a day (BID) | RESPIRATORY_TRACT | Status: DC | PRN
  Administered 2021-09-15 – 2021-09-17 (×4): 2.5 mg via RESPIRATORY_TRACT
  Filled 2021-09-14 (×4): qty 3

## 2021-09-14 MED ORDER — CHLORHEXIDINE GLUCONATE CLOTH 2 % EX PADS
6.0000 | MEDICATED_PAD | Freq: Every day | CUTANEOUS | Status: DC
  Administered 2021-09-16: 6 via TOPICAL

## 2021-09-14 MED ORDER — MIDODRINE HCL 5 MG PO TABS
5.0000 mg | ORAL_TABLET | Freq: Three times a day (TID) | ORAL | Status: DC
  Administered 2021-09-14 – 2021-09-16 (×6): 5 mg via ORAL
  Filled 2021-09-14 (×6): qty 1

## 2021-09-14 MED ORDER — FLUTICASONE FUROATE-VILANTEROL 200-25 MCG/ACT IN AEPB
1.0000 | INHALATION_SPRAY | Freq: Every day | RESPIRATORY_TRACT | Status: DC
  Filled 2021-09-14: qty 28

## 2021-09-14 MED ORDER — MONTELUKAST SODIUM 10 MG PO TABS
10.0000 mg | ORAL_TABLET | Freq: Every day | ORAL | Status: DC
  Administered 2021-09-14 – 2021-09-17 (×4): 10 mg via ORAL
  Filled 2021-09-14 (×4): qty 1

## 2021-09-14 MED ORDER — APIXABAN 5 MG PO TABS
10.0000 mg | ORAL_TABLET | Freq: Once | ORAL | Status: AC
Start: 2021-09-14 — End: 2021-09-14
  Administered 2021-09-14: 10 mg via ORAL
  Filled 2021-09-14: qty 2

## 2021-09-14 NOTE — Assessment & Plan Note (Signed)
Patient is on sinus rhythm, plan to continue telemetry monitoring.  He on full anticoagulation for pulmonary embolism.

## 2021-09-14 NOTE — Procedures (Signed)
HD Note:  Some information was entered later than the data was gathered due to patient care needs. The entered time with the data is accurate.   Received patient in bed to unit.  Alert and oriented.  Informed consent signed and in chart.     Patient tolerated HD with a complaint of cramping beginning to start  in right leg at 1250.  UF stopped Transported back to the ER Alert, without acute distress.  Hand-off given to patient's nurse.   Access used: HD Catheter Access issues: Bibe  UF Removed: Medication(s) given:See MAR    [facility]

## 2021-09-14 NOTE — Assessment & Plan Note (Signed)
>>  ASSESSMENT AND PLAN FOR ACUTE ON CHRONIC SYSTOLIC HEART FAILURE (HCC) WRITTEN ON 09/17/2021  2:30 PM BY Coralie Keens, MD  Echocardiogram with reduced LV systolic function with EF 30 to 35%, global hypokinesis, severe LVH, RV systolic function with mild reduction, RV cavity with severe dilatation, severe elevated pulmonary artery systolic pressure, RVSP 62,5 mmHg. LA with severe dilatation, moderate to severe MR. Moderate to severe TR.   Acute on chronic core pulmonale Pulmonary hypertension.   Blood pressure 98 to 120 mmHg Midodrine will be increase to 10 mg tid for blood pressure support.  Continue attempt fluid removal per HD and ultrafiltration.

## 2021-09-14 NOTE — Assessment & Plan Note (Addendum)
ESRD on HD (access right IJ TDC) Hypokalemia and hyperphosphatemia   Patient underwent 3 consecutive days hemodialysis with good toleration. At the time of his discharge he is feeling back to his baseline.  He has lost 5 kg since admission and his discharge weight is 73.2 kg.   Likely patient has lost body weight and dry weight need to be modified as outpatient.  Continue with oral phosphate binders.   Anemia of chronic renal disease, hgb has been stable at 11,7 Iron deficiency anemia, continue with oral iron supplementation.   Acute on chronic hypoxemic respiratory failure, patient is on home 02 3 L/min.

## 2021-09-14 NOTE — ED Notes (Signed)
BSC placed at bedside.

## 2021-09-14 NOTE — Assessment & Plan Note (Signed)
Continue anticoagulation with apixaban.  ?

## 2021-09-14 NOTE — Assessment & Plan Note (Signed)
No signs of exacerbation, continue oxymetry monitoring.  Patient has chronic hypoxemic respiratory failure and is on home 02 (3 L/min).

## 2021-09-14 NOTE — ED Notes (Signed)
Pt placement notifed pt in HD

## 2021-09-14 NOTE — Consult Note (Addendum)
Renal Service Consult Note [facility]  [name] 09/14/2021 [name], MD Requesting Physician: Dr. Cathlean Sauer  Reason for Consult: ESRD pt w/ SOB/ vol overload HPI: The patient is a 61 y.o. year-old w/ hx of anemia, anxiety, CHF, DM2, COPD, ESRD on HD, sp CVA, HTN, PAF, PAD who presented w/ c/o SOB. SOB x 2 days w/ orthopnea and worsening LE edema. Mild cough, no fevers or chills. Pt admitted. We are asked to see for ESRD.    Pt seen in HD unit. Main c/o is swelling in both legs and SOB. No cough or fevers. No abd pain.   Pt lives w/ grandkids, they drive him to HD or either he drives himself sometimes. Has not missed any dialysis.   ROS - denies CP, no joint pain, no HA, no blurry vision, no rash, no diarrhea, no nausea/ vomiting, no dysuria, no difficulty voiding   Past Medical History  Past Medical History:  Diagnosis Date   Anemia    Anxiety    Asthma    CHF (congestive heart failure) ([place])    Complication from renal dialysis device 63/87/5643   Complication of anesthesia 2017   according to pt and spouse pt was moving around and cough while under and pt had difficulty waking up so the anesthesia had to be reversed. and pt admitted to ICU.    COPD (chronic obstructive pulmonary disease) (HCC)    Diabetes mellitus without complication ([place])    Type II - Patient states he does not have diabetes, "they said sometimes when you start dialysis you don't have diabetes any longer"    ESOPHAGEAL VARICES 10/04/2008   Qualifier: Diagnosis of  By: Fuller Plan MD Lamont Snowball T    ESRD    on HD, T-TH-Sat - [place]   Hemiparesis due to old stroke [facility])    left   Hypertension    Hx, not current problems, no meds   LV dysfunction    EF 25-30% by echo 07/2011   Memory loss due to medical condition    due to stroke   MR (mitral regurgitation)    moderate to severe, echo 07/2011   Paroxysmal atrial fibrillation (HCC)    Peripheral vascular disease (HCC)     Shortness of breath    with exertion   Stroke ([place])    TIA's-left sided weakness   Tobacco abuse    Past Surgical History  Past Surgical History:  Procedure Laterality Date   ABDOMINAL AORTOGRAM W/LOWER EXTREMITY Bilateral 07/21/2018   Procedure: ABDOMINAL AORTOGRAM W/LOWER EXTREMITY;  Surgeon: Waynetta Sandy, MD;  Location: [place] CV LAB;  Service: Cardiovascular;  Laterality: Bilateral;   AMPUTATION Left 11/05/2018   Procedure: AMPUTATION SECOND TOE LEFT FOOT;  Surgeon: Waynetta Sandy, MD;  Location: [place];  Service: Vascular;  Laterality: Left;   AMPUTATION Left 02/13/2021   Procedure: Left small finger AMPUTATION DIGIT;  Surgeon: Sherilyn Cooter, MD;  Location: [place];  Service: Orthopedics;  Laterality: Left;   AV FISTULA PLACEMENT     CARDIAC CATHETERIZATION     [facility]   COLONOSCOPY W/ BIOPSIES AND POLYPECTOMY     FISTULA SUPERFICIALIZATION Left 11/10/2013   Procedure: FISTULA PLICATION;  Surgeon: Serafina Mitchell, MD;  Location: [place];  Service: Vascular;  Laterality: Left;   INSERTION OF DIALYSIS CATHETER N/A 06/20/2021   Procedure: INSERTION OF DIALYSIS CATHETER;  Surgeon: Broadus John, MD;  Location: [place];  Service: Vascular;  Laterality: N/A;  KIDNEY TRANSPLANT     2011 rejected kidney 2012 back on dialysis   LEFT AND RIGHT HEART CATHETERIZATION WITH CORONARY ANGIOGRAM N/A 10/09/2013   Procedure: LEFT AND RIGHT HEART CATHETERIZATION WITH CORONARY ANGIOGRAM;  Surgeon: Burnell Blanks, MD;  Location: [facility] CATH LAB;  Service: Cardiovascular;  Laterality: N/A;   LIGATION OF ARTERIOVENOUS  FISTULA Left 06/20/2021   Procedure: LIGATION OF ARTERIOVENOUS  FISTULA;  Surgeon: Broadus John, MD;  Location: [place];  Service: Vascular;  Laterality: Left;   PERIPHERAL VASCULAR INTERVENTION Left 07/21/2018   Procedure: PERIPHERAL VASCULAR INTERVENTION;  Surgeon: Waynetta Sandy, MD;  Location: [place] CV LAB;  Service: Cardiovascular;   Laterality: Left;  Popliteal   REVISON OF ARTERIOVENOUS FISTULA Left 08/26/2013   Procedure: EXCISION OF ERODED SKIN AND EXPLORATION OF MAIN LEFT UPPER ARM AV FISTULA;  Surgeon: Rosetta Posner, MD;  Location: [place];  Service: Vascular;  Laterality: Left;   REVISON OF ARTERIOVENOUS FISTULA Left 02/05/2014   Procedure: REPAIR OF ARTERIOVENOUS FISTULA ANEURYSM;  Surgeon: Serafina Mitchell, MD;  Location[facility];  Service: Vascular;  Laterality: Left;   REVISON OF ARTERIOVENOUS FISTULA Left 03/10/2021   Procedure: LEFT ARM REVISION OF ARTERIOVENOUS FISTULA WITH PLICATION;  Surgeon: Waynetta Sandy, MD;  Location: [place];  Service: Vascular;  Laterality: Left;   SHUNTOGRAM N/A 11/05/2012   Procedure: Fistulogram;  Surgeon: Serafina Mitchell, MD;  Location: [facility] CATH LAB;  Service: Cardiovascular;  Laterality: N/A;   TEE WITHOUT CARDIOVERSION  08/17/2011   Procedure: TRANSESOPHAGEAL ECHOCARDIOGRAM (TEE);  Surgeon: Larey Dresser, MD;  Location: [facility] ENDOSCOPY;  Service: Cardiovascular;  Laterality: N/A;   ULTRASOUND GUIDANCE FOR VASCULAR ACCESS  06/20/2021   Procedure: ULTRASOUND GUIDANCE FOR VASCULAR ACCESS;  Surgeon: Broadus John, MD;  Location: [facility] OR;  Service: Vascular;;   VIDEO ASSISTED THORACOSCOPY (VATS)/DECORTICATION  08/10/11   Family History  Family History  Problem Relation Age of Onset   Hypertension Mother    Varicose Veins Mother    Diabetes Paternal Grandmother    Cancer Paternal Grandfather    CAD Paternal Uncle    Social History  reports that he quit smoking about 9 years ago. His smoking use included cigarettes. He has a 7.50 pack-year smoking history. He has never used smokeless tobacco. He reports that he does not drink alcohol and does not use drugs. Allergies  Allergies  Allergen Reactions   Codeine Shortness Of Breath   Dextromethorphan-Guaifenesin Shortness Of Breath   Iron Dextran Shortness Of Breath   Morphine And Related Shortness Of Breath   Home medications Prior to  Admission medications   Medication Sig Start Date End Date Taking? Authorizing Provider  acetaminophen (TYLENOL) 500 MG tablet Take 1 tablet (500 mg total) by mouth every 6 (six) hours as needed. Patient not taking: Reported on 08/09/2021 04/11/18   Zigmund Gottron, NP  albuterol [facility] HFA) 108 (90 Base) MCG/ACT inhaler Inhale 2 puffs into the lungs every 6 (six) hours as needed for wheezing or shortness of breath. Patient taking differently: Inhale 1-2 puffs into the lungs daily as needed for wheezing or shortness of breath. 08/25/21   Charlott Rakes, MD  albuterol (PROVENTIL) (2.5 MG/3ML) 0.083% nebulizer solution Take 3 mLs (2.5 mg total) by nebulization every 6 (six) hours as needed for wheezing or shortness of breath. Patient taking differently: Take 2.5 mg by nebulization 2 (two) times daily as needed for wheezing or shortness of breath. 08/25/21   Charlott Rakes, MD  APIXABAN (  ELIQUIS) VTE STARTER PACK ('10MG'$  AND '5MG'$ ) Take as directed on package: start with two-'5mg'$  tablets twice daily for 7 days. On day 8, switch to one-'5mg'$  tablet twice daily. 09/08/21   Allie Bossier, MD  docusate sodium (COLACE) 100 MG capsule Take 1 capsule (100 mg total) by mouth 2 (two) times daily. Patient taking differently: Take 100 mg by mouth 2 (two) times daily as needed for mild constipation. 03/15/21   Sheikh, Omair Latif, DO  fluticasone-salmeterol (ADVAIR DISKUS) 250-50 MCG/ACT AEPB Inhale 1 puff into the lungs in the morning and at bedtime. NEEDS PASS Patient taking differently: Inhale 1 puff into the lungs daily. 08/09/21   Gildardo Pounds, NP  hydrOXYzine (ATARAX) 10 MG tablet Take 1 tablet (10 mg total) by mouth 3 (three) times daily as needed. For anxiety Patient taking differently: Take 10 mg by mouth as needed for anxiety. 06/07/21   Gildardo Pounds, NP  midodrine (PROAMATINE) 5 MG tablet Take 1 tablet (5 mg total) by mouth 3 (three) times daily with meals. 09/08/21   Allie Bossier, MD  montelukast (SINGULAIR)  10 MG tablet Take 1 tablet (10 mg total) by mouth at bedtime. Patient taking differently: Take 10 mg by mouth daily. 06/07/21   Gildardo Pounds, NP  pantoprazole (PROTONIX) 40 MG tablet Take 1 tablet (40 mg total) by mouth daily. Patient not taking: Reported on 08/09/2021 06/23/21 06/23/22  Elodia Florence., MD  traZODone (DESYREL) 50 MG tablet Take 1 tablet (50 mg total) by mouth at bedtime as needed for sleep. 09/08/21   Allie Bossier, MD  VELPHORO 500 MG chewable tablet Chew 1,000 mg by mouth 2 (two) times daily after a meal. 02/06/21   [provider]  VISINE 0.05 % ophthalmic solution Place 4 drops into both eyes daily. 07/04/21   [provider]     Vitals:   09/14/21 0545 09/14/21 0630 09/14/21 0645 09/14/21 0645  BP: 127/88 114/87 (!) 117/91   Pulse: (!) 51 78 77   Resp: (!) 37 19 (!) 27   Temp:    98.3 F (36.8 C)  TempSrc:    Oral  SpO2: 100% 99% 98%   Weight:      Height:       Exam Gen alert, no distress No rash, cyanosis or gangrene Sclera anicteric, throat clear  No jvd or bruits Chest mild bilat basilar rales RRR no MRG Abd soft ntnd no mass or ascites +bs GU normal male MS no joint effusions or deformity Ext no LE or UE edema, no wounds or ulcers Neuro is alert, Ox 3 , nf    RIJ TDC in place   Home meds include - albuterol, apixaban, colace, advair diskus, midodrine 5 tid, montelukast, pantoprazole, trazodone, velphoro 1 gm ac tid, prns/ vits/ supps  CXR 9/06 - IMPRESSION: 1. Cardiomegaly with mild pulmonary edema. 2. 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 Followup PA and lateral chest X-ray is recommended in 3-4 weeks following therapy to ensure resolution and exclude underlying malignancy.   OP HD: SW TTS 4h  450/500   73.5kg  2/2 bath  P2   TDC   Hep none - last HD 9/5, post  74kg, ave UF 2-3kg - etelcalcetide 7.5 mg iv tiw - last Hb 11.5, not on esa or Fe       Na 141  K 3.5  CO2 21  BUN 46  Creat 8.21  Alb 2.5  Ca 8.0  phos 6.6     Hb 10.0   WBC 3K  plt 71k  Assessment/ Plan: SOB /vol overload - getting to dry wt at OP unit. May be losing body wt.  CXR has chronic RLL scarring but also vasc congestion and IS edema. Also recent CTA of chest on 8/28 done for acute PE, also showed diffuse GG changes c/w pulm edema. Will plan for HD today and extra HD tomorrow to lower vol further.  ESRD - on HD TTS. Plan HD today and again tomorrow for vol overload.  BP - HD's are wnl, not high, follow w/ vol removal. Cont home midodrine at '5mg'$  tid.  Volume - 4 kg up pre HD today, will likely need edw lowering.  Anemia esrd - Hb 10-11 here, not on esa , no esa needs for now MBD ckd - not on vdra, CCa in range, and phos a bit high. Follow, cont binder    Nathaniel Jennel Mara  MD 09/14/2021, 7:58 AM Recent Labs  Lab 09/08/21 0537 09/13/21 1710 09/14/21 0250 09/14/21 0302  HGB 11.7* 11.7* 10.0* 10.5*  ALBUMIN 3.0* 3.3* 2.5*  --   CALCIUM 9.7 9.7 8.0*  --   PHOS 5.4*  --  6.6*  --   CREATININE 6.46* 8.77* 8.21*  --   K 4.3 4.0 3.4* 3.5   Inpatient medications:  apixaban  5 mg Oral BID   Chlorhexidine Gluconate Cloth  6 each Topical Q0600   fluticasone furoate-vilanterol  1 puff Inhalation Daily   midodrine  5 mg Oral TID WC   montelukast  10 mg Oral Daily   sucroferric oxyhydroxide  1,000 mg Oral BID PC    acetaminophen **OR** acetaminophen, albuterol

## 2021-09-14 NOTE — Hospital Course (Addendum)
Nathaniel White was admitted to the hospital with the working diagnosis of volume overload in the setting of ESRD.   61 yo male with the past medical history of ERSD on HD, pulmonary embolism, systolic heart failure and severe mitral regurgitation. Atrial fibrillation, COPD and left hemiparesis. Recent hospitalization for pulmonary embolism on 08.28 to 09.01.23.  Patient reported 2 days of progressive dyspnea, with worsening lower extremity edema and orthopnea. On his initial physical examination his blood pressure was 120/95, HR 70 to 77, RR 17 to 20 and 02 saturation 99% on supplemental 02 per [place], lungs with no wheezing or rhonchi, heart with S1 and S2 present irregular, abdomen not distended, positive lower extremity edema.   Na 141, K 4,0 CL 99, bicarbonate 26 glucose 99, bun 45 cr 8,7  BNP 3,508 High sensitive troponin 75 and 74  Wbc 3,4 hgb 11,7 plt 85  Sars covid 19 negative   Chest radiograph with mild cardiomegaly bilateral hilar vascular congestion and bilateral interstitial infiltrates.   EKG 73 bpm, left axis deviation, qtc 497, sinus rhythm with frequent left ventricle PVC, poor R R wave progression, with no significant ST segment or T wave changes.  Patient underwent HD with ultrafiltration with improvement of his symptoms 09/08, 09/08, 09/09 consecutive days of hemodialysis. At the time of his discharge he is feeling back to his baseline, he dose have supplemental home 02. Plan to continue renal replacement therapy as outpatient.

## 2021-09-14 NOTE — Progress Notes (Addendum)
Progress Note   Patient: Nathaniel White XBJ:[id number] DOB: 1960-06-08 DOA: 09/13/2021     0 DOS: the patient was seen and examined on 09/14/2021   Brief hospital course: Mr. Roylance was admitted to the hospital with the working diagnosis of volume overload in the setting of ESRD.   61 yo male with the past medical history of ERSD on HD, pulmonary embolism, systolic heart failure and severe mitral regurgitation. Atrial fibrillation, COPD and left hemiparesis. Recent hospitalization for pulmonary embolism on 08.28 to 09.01.23.  Patient reported 2 days of progressive dyspnea, with worsening lower extremity edema and orthopnea. On his initial physical examination his blood pressure was 120/95, HR 70 to 77, RR 17 to 20 and 02 saturation 99% on supplemental 02 per [place], lungs with no wheezing or rhonchi, heart with S1 and S2 present irregular, abdomen not distended, positive lower extremity edema.   Na 141, K 4,0 CL 99, bicarbonate 26 glucose 99, bun 45 cr 8,7  BNP 3,508 High sensitive troponin 75 and 74  Wbc 3,4 hgb 11,7 plt 85  Sars covid 19 negative   Chest radiograph with mild cardiomegaly bilateral hilar vascular congestion and bilateral interstitial infiltrates.   EKG 73 bpm, left axis deviation, qtc 497, sinus rhythm with frequent left ventricle PVC, poor R R wave progression, with no significant ST segment or T wave changes.  Patient underwent HD with ultrafiltration with improvement of his symptoms Plan to have another HD tomorrow.       Assessment and Plan: * Volume overload ESRD on HD  Hypokalemia and hyperphosphatemia    Follow up renal function with serum K at 3,5 with bicarb of 21. P 6,6   Patient had HD with ultrafiltration with improvement in his volume status.  Plan for HD tomorrow Likely patient has lost body weight and dry weight need to be modified. Continue to follow up on nephrology recommendations Add oral phosphate binders.  Anemia of chronic renal disease, hgb  has been stable at 11,7  Acute on chronic systolic heart failure [facility]) Patient has a decreased LV systolic function.  Will need more aggressive ultrafiltration on HD Patient has been eating ice over last few days.  Continue blood pressure monitoring.  Continue midodrine for blood pressure support.   Paroxysmal atrial fibrillation [facility]) Patient is on sinus rhythm, plan to continue telemetry monitoring.   Acute pulmonary embolism (HCC) Continue anticoagulation with apixaban.   Anemia of chronic disease Anemia of chronic renal disease.   Leukopenia and thrombocytopenia Poor prognosis sign Follow up cell count as outpatient.   COPD (chronic obstructive pulmonary disease) (HCC) No signs of exacerbation, continue oxymetry monitoring.         Subjective: Patient post HD is feeling better but not yet back to baseline, no chest pain, no nausea or vomiting.   Physical Exam: Vitals:   09/14/21 1300 09/14/21 1337 09/14/21 1530 09/14/21 1546  BP: 111/78 119/83 104/78   Pulse: 78 66    Resp: 19 (!) 21    Temp:  (!) 96.8 F (36 C)  97.6 F (36.4 C)  TempSrc:  Oral    SpO2: 100% 100%    Weight:      Height:       Neurology awake and alert ENT with mild pallor Cardiovascular with S1 and S2 present with extra beats, with no murmurs or gallops Respiratory with no rales or wheezing Abdomen with no distention  Positive + + lower extremity edema  Data Reviewed:    Family  Communication: friend at the bedside   Disposition: Status is: Inpatient Remains inpatient appropriate because: volume overload   Planned Discharge Destination: Home      Author: Tawni Millers, MD 09/14/2021 4:16 PM  For on call review [email or web address].

## 2021-09-14 NOTE — Plan of Care (Signed)

## 2021-09-14 NOTE — Assessment & Plan Note (Signed)
Anemia of chronic renal disease.   Leukopenia and thrombocytopenia Poor prognosis sign Follow up cell count as outpatient.

## 2021-09-14 NOTE — Assessment & Plan Note (Addendum)
Patient has a decreased LV systolic function.  Continue with ultrafiltration for volume removal Advised about salt and fluid restrictions.  Systolic blood pressure 98 to 107 mmHg.  Continue with midodrine for blood pressure support.

## 2021-09-15 DIAGNOSIS — I2699 Other pulmonary embolism without acute cor pulmonale: Secondary | ICD-10-CM | POA: Diagnosis not present

## 2021-09-15 DIAGNOSIS — I5023 Acute on chronic systolic (congestive) heart failure: Secondary | ICD-10-CM | POA: Diagnosis not present

## 2021-09-15 DIAGNOSIS — E8770 Fluid overload, unspecified: Secondary | ICD-10-CM | POA: Diagnosis not present

## 2021-09-15 DIAGNOSIS — D638 Anemia in other chronic diseases classified elsewhere: Secondary | ICD-10-CM | POA: Diagnosis not present

## 2021-09-15 LAB — RENAL FUNCTION PANEL
Albumin: 3.1 g/dL — ABNORMAL LOW (ref 3.5–5.0)
Anion gap: 15 (ref 5–15)
BUN: 44 mg/dL — ABNORMAL HIGH (ref 6–20)
CO2: 26 mmol/L (ref 22–32)
Calcium: 9.7 mg/dL (ref 8.9–10.3)
Chloride: 95 mmol/L — ABNORMAL LOW (ref 98–111)
Creatinine, Ser: 7.19 mg/dL — ABNORMAL HIGH (ref 0.61–1.24)
GFR, Estimated: 8 mL/min — ABNORMAL LOW (ref >60)
Glucose, Bld: 92 mg/dL (ref 70–99)
Phosphorus: 5.6 mg/dL — ABNORMAL HIGH (ref 2.5–4.6)
Potassium: 4 mmol/L (ref 3.5–5.1)
Sodium: 136 mmol/L (ref 135–145)

## 2021-09-15 LAB — MRSA NEXT GEN BY PCR, NASAL: MRSA by PCR Next Gen: NOT DETECTED

## 2021-09-15 MED ORDER — CHLORHEXIDINE GLUCONATE CLOTH 2 % EX PADS
6.0000 | MEDICATED_PAD | Freq: Every day | CUTANEOUS | Status: DC
  Administered 2021-09-16 – 2021-09-17 (×2): 6 via TOPICAL

## 2021-09-15 MED ORDER — HEPARIN SODIUM (PORCINE) 1000 UNIT/ML IJ SOLN
INTRAMUSCULAR | Status: AC
  Filled 2021-09-15: qty 4

## 2021-09-15 NOTE — Progress Notes (Deleted)
Patient still confused. States I am the one (the nurse) that spoke to him about amputating his toe. SW was in the room and I informed patient that I am NOT the one who spoke with him in hemodialysis. The patient;'s sister came to the unit while he was in HD and requested the doctor call her at [phone]. Pt now at this time states he told was by a bunch of people while in HD that they were going to do something to his foot.  He said he did not know they were going to operate today.   Pt is confused and does not recall information correctly if at all.  Gave sister's number Candis Schatz to resident while he was rounding with Dr. Daryll Drown.  Pt states someone called him from [phone] once he returned to the room.

## 2021-09-15 NOTE — TOC Initial Note (Addendum)
Transition of Care [facility]) - Initial/Assessment Note    Patient Details  Name: Nathaniel White MRN: [id number] Date of Birth: 30-Aug-1960  Transition of Care [facility]) CM/SW Contact:    Zenon Mayo, RN Phone Number: 09/15/2021, 10:15 AM  Clinical Narrative:                 From home, states friends live with him.  He is active with [facility] for HHPT, NCM offered choice, he would like to continue to stay with [facility] for HHPT.  NCM will aske [facility] to add a HHRN to services.  He is set up with home oxygen with [employer] and states when he goes out he leaves the oxygen tank at home.  Also he has been drinking more fluid, he is a HD patient also.   Nephrology MD informed him about the fluids, to not over do it because the fluid is going to his lungs.   Patient has transportation home when dc home. NCM confirmed with [facility] about services.  The rep states they have been trying to contact patient but they could not get an answer.  Patient states something was wrong with his phone.  NCM asked to add HHRN to services.  Alvis Lemmings will add HHRN to services for disease management. Soc will begin on Mon or Tues.   Patient states he will buy a scale to weigh himself , he has not been checking bp , and he will try to do better with his diet. Per MD patient for dc after HD today is a possibility.   Expected Discharge Plan: [place] Barriers to Discharge: Continued Medical Work up   Patient Goals and CMS Choice Patient states their goals for this hospitalization and ongoing recovery are:: return home with [place] CMS Medicare.gov Compare Post Acute Care list provided to:: Patient Choice offered to / list presented to : Patient  Expected Discharge Plan and Services Expected Discharge Plan: [place] In-house Referral: NA Discharge Planning Services: CM Consult Post Acute Care Choice: [place] arrangements for the past 2 months: Single Family Home                    DME Agency: NA       HH Arranged: PT, RN [place] Agency: Gage Date [facility] Agency Contacted: 09/15/21 Time HH Agency Contacted: [phone] Representative spoke with at [place]: Tommi Rumps  Prior Living Arrangements/Services Living arrangements for the past 2 months: [place] Lives with:: Friends Patient language and need for interpreter reviewed:: Yes Do you feel safe going back to the place where you live?: Yes      Need for Family Participation in Patient Care: Yes (Comment) Care giver support system in place?: Yes (comment) Current home services: DME (home oxygen with [employer], walker) Criminal Activity/Legal Involvement Pertinent to Current Situation/Hospitalization: No - Comment as needed  Activities of Daily Living      Permission Sought/Granted                  Emotional Assessment Appearance:: Appears stated age Attitude/Demeanor/Rapport: Engaged Affect (typically observed): Appropriate Orientation: : Oriented to Self, Oriented to Place, Oriented to  Time, Oriented to Situation Alcohol / Substance Use: Not Applicable Psych Involvement: No (comment)  Admission diagnosis:  Acute on chronic systolic heart failure (HCC) [I50.23] Volume overload [E87.70] Acute on chronic congestive heart failure, unspecified heart failure type [facility]) [I50.9] Patient Active Problem List   Diagnosis  Date Noted   Volume overload 09/14/2021   COPD (chronic obstructive pulmonary disease) (HCC)    Acute on chronic systolic heart failure (HCC) 09/13/2021   Pulmonary embolus, right (HCC) 78/46/9629   Chronic systolic CHF (congestive heart failure) ([place]) 09/07/2021   Severe pulmonary hypertension ([place]) 09/07/2021   End-stage renal disease on hemodialysis ([place]) 09/07/2021   Diabetes mellitus type 2, controlled, without complications ([place]) 52/84/1324   Anemia of chronic disease 09/07/2021   Shortness of breath 09/04/2021   Acute pulmonary embolism ([place]) 09/04/2021   Pulmonary  embolism ([place]) 09/04/2021   NSVT (nonsustained ventricular tachycardia) (HCC) 06/26/2021   Hypoglycemia 06/24/2021   Cirrhosis ([place]) 06/23/2021   Confusion 06/22/2021   Necrotizing pneumonia ([place]) 06/21/2021   HFrEF (heart failure with reduced ejection fraction) ([place]) 06/21/2021   CVA (cerebral vascular accident) ([place]) 06/21/2021   Paroxysmal atrial fibrillation ([place]) 06/21/2021   Chronic hypotension 06/21/2021   Unusual Appearance with Innumerable Lesions on CT scan, needs MRI 06/21/2021   CAD (coronary artery disease) 06/21/2021   Aortic atherosclerosis ([place]) 06/21/2021   ESRD (end stage renal disease) ([place]) 06/20/2021   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 (HCC) 06/20/2021   Nausea with vomiting, unspecified 06/15/2021   Acute respiratory failure with hypoxia ([place]) 03/10/2021   Shock circulatory ([place]) 03/10/2021   Drug-induced hypotension 03/10/2021   Finger infection    Paronychia of finger, left 02/03/2021   Left hand pain 12/29/2020   Allergy, unspecified, initial encounter 09/28/2019   Anaphylactic shock, unspecified, initial encounter 09/28/2019   Pressure ulcer of ankle 09/01/2018   Thrombocytopenia ([place]) 01/17/2015   Transfusion history 01/17/2015   Hypercalcemia 11/01/2014   Dependence on renal dialysis ([place]) 03/26/2014   Encounter for fitting and adjustment of extracorporeal dialysis catheter ([place]) 03/26/2014   Diarrhea, unspecified 01/03/2014   Hemodialysis AV fistula aneurysm ([place]) 11/10/2013   Pain, unspecified 09/23/2013   Cough 40/10/2723   CHF, systolic dysfunction 36/64/4034   Coagulation defect, unspecified ([place]) 12/15/2012   Moderate protein-calorie malnutrition ([place]) 08/07/2012   Restrictive lung disease 07/06/2012   History of CVA (cerebrovascular accident) 08/13/2011   ESRD (end stage renal disease) on dialysis ([place]) 03/13/2011   HTN (hypertension) 03/13/2011   H/O 03/13/2011   Anemia 03/13/2011   Fluid overload  03/13/2011   History of nonadherence to medical treatment 03/13/2011   Type 2 diabetes mellitus ([place]) 03/13/2011   Tobacco abuse 03/13/2011   Kidney transplant status 11/28/2008   HEMATOCHEZIA 10/04/2008   Iron deficiency anemia, unspecified 03/31/2003   Patient's noncompliance with other medical treatment and regimen 03/31/2003   Personal history of nicotine dependence 03/31/2003   Secondary hyperparathyroidism of renal origin ([place]) 03/31/2003   PCP:  Gildardo Pounds, NP Pharmacy:   [facility] [place], [place] - [address] AT [address] [place] [address] Phone: [phone] Fax: [phone]     Social Determinants of Health (SDOH) Interventions    Readmission Risk Interventions    09/15/2021   10:09 AM 09/06/2021    4:20 PM 06/23/2021    3:52 PM  Readmission Risk Prevention Plan  Transportation Screening Complete Complete Complete  PCP or Specialist Appt within 3-5 Days Complete Complete Complete  HRI or Home Care Consult Complete Complete Complete  Social Work Consult for [place] Planning/Counseling Complete Complete Complete  Palliative Care Screening Not Applicable Not Applicable Not Applicable  Medication Review Press photographer) Complete Complete Complete

## 2021-09-15 NOTE — Progress Notes (Addendum)
[facility] Progress Note  Subjective: pt seen in room, wife at bedside. SOB a bit better.   Vitals:   09/14/21 2200 09/14/21 2339 09/15/21 0357 09/15/21 0724  BP:  98/68 123/76 107/69  Pulse:  73 75 76  Resp:  '19 18 15  '$ Temp:  97.8 F (36.6 C) 97.9 F (36.6 C) 97.9 F (36.6 C)  TempSrc:  Oral Oral   SpO2:  100% 98% 100%  Weight: 78.9 kg  78 kg   Height: '5\' 9"'$  (1.753 m)       Exam: Gen alert, no distress No rash, cyanosis or gangrene Sclera anicteric, throat clear  No jvd or bruits Chest mild bilat basilar rales RRR no MRG Abd soft ntnd no mass or ascites +bs GU normal male MS no joint effusions or deformity Ext no LE or UE edema, no wounds or ulcers Neuro is alert, Ox 3 , nf    RIJ TDC in place    Home meds include - albuterol, apixaban, colace, advair diskus, midodrine 5 tid, montelukast, pantoprazole, trazodone, velphoro 1 gm ac tid, prns/ vits/ supps   CXR 9/06 - IMPRESSION: 1. Cardiomegaly with mild pulmonary edema. 2. 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 Followup PA and lateral chest X-ray is recommended in 3-4 weeks following therapy to ensure resolution and exclude underlying malignancy.    OP HD: SW TTS 4h  450/500   73.5kg  2/2 bath  P2   TDC   Hep none - hep B labs done 8/30 - last HD 9/5, post 74kg, ave UF 2-3kg - etelcalcetide 7.5 mg iv tiw - last Hb 11.5, not on esa or Fe        Na 141  K 3.5  CO2 21  BUN 46  Creat 8.21  Alb 2.5  Ca 8.0  phos 6.6     Hb 10.0   WBC 3K  plt 71k   Assessment/ Plan: SOB /vol overload - getting to dry wt at OP unit. May be losing body wt.  Admit CXR has chronic RLL scarring but also vasc congestion/ IS edema, sig LE edema, 4kg over on admission. Also recent CTA of chest on 8/28 showed extensive GG changes c/w edema.  ESRD - on HD TTS. Had HD last  night. For extra HD today then HD again tomorrow. BP - BPs are wnl, follow w/ vol removal. Not on any BP lowering meds at home, cont home midodrine at '5mg'$  tid. Need to get vol / wts down. Pt not helping by drinking excessive amts of liquids.  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 Follow, cont binder  Rob Pamula [place] 09/15/2021, 9:02 AM   Recent Labs  Lab 09/14/21 0250 09/14/21 0302 09/15/21 0542  HGB 10.0* 10.5*  --   ALBUMIN 2.5*  --  3.1*  CALCIUM 8.0*  --  9.7  PHOS 6.6*  --  5.6*  CREATININE 8.21*  --  7.19*  K 3.4* 3.5 4.0   No results for input(s): "IRON", "TIBC", "FERRITIN" in the last 168 hours. Inpatient medications:  apixaban  5 mg Oral BID   calcium acetate  1,334 mg Oral TID WC   Chlorhexidine Gluconate Cloth  6 each Topical Q0600   fluticasone furoate-vilanterol  1 puff Inhalation Daily   midodrine  5  mg Oral TID WC   montelukast  10 mg Oral Daily   sucroferric oxyhydroxide  1,000 mg Oral BID PC    acetaminophen **OR** acetaminophen, albuterol

## 2021-09-15 NOTE — Progress Notes (Signed)
   09/15/21 1500  Mobility  Activity Ambulated with assistance in hallway  Level of Assistance Contact guard assist, steadying assist  Assistive Device Four wheel walker  Distance Ambulated (ft) 45 ft  Activity Response Tolerated fair;Tolerated well  $Mobility charge 1 Mobility   Mobility Specialist Progress Note  Pt was in chair and agreeable. X1 seated break d/t fatigue. Ambulated on 4L of O2. Returned to chair w/ all needs met and call bell in reach.   Lucious Groves Mobility Specialist

## 2021-09-15 NOTE — Progress Notes (Signed)
  Report received from [place]. Awaiting report from HD.

## 2021-09-15 NOTE — Progress Notes (Signed)
Report given to charge nurse on 5 M for this patient after shift change. Oncoming nurse on 5 M needed to have report again.  Pt is going to 9M room 7.

## 2021-09-15 NOTE — Progress Notes (Signed)
Pt en route to hemodialysis.

## 2021-09-15 NOTE — Plan of Care (Signed)

## 2021-09-15 NOTE — Progress Notes (Signed)
64 Pt went to HD  Pt did not return to unit. Family packed up his belongings and took them to the car and home.  She then came back to the room and got his oxygen tank and sandals.  Per Dr. Cathlean Sauer, pt is transferring to 51 M after hemodialysis per nephrology.  No care provided by nurse on HF unit after 1530.  Report called to nurse on 5 M.

## 2021-09-15 NOTE — Progress Notes (Signed)
Progress Note   Patient: Nathaniel White QPY:[id number] DOB: 08/07/1960 DOA: 09/13/2021     1 DOS: the patient was seen and examined on 09/15/2021   Brief hospital course: Mr. Riddles was admitted to the hospital with the working diagnosis of volume overload in the setting of ESRD.   61 yo male with the past medical history of ERSD on HD, pulmonary embolism, systolic heart failure and severe mitral regurgitation. Atrial fibrillation, COPD and left hemiparesis. Recent hospitalization for pulmonary embolism on 08.28 to 09.01.23.  Patient reported 2 days of progressive dyspnea, with worsening lower extremity edema and orthopnea. On his initial physical examination his blood pressure was 120/95, HR 70 to 77, RR 17 to 20 and 02 saturation 99% on supplemental 02 per [place], lungs with no wheezing or rhonchi, heart with S1 and S2 present irregular, abdomen not distended, positive lower extremity edema.   Na 141, K 4,0 CL 99, bicarbonate 26 glucose 99, bun 45 cr 8,7  BNP 3,508 High sensitive troponin 75 and 74  Wbc 3,4 hgb 11,7 plt 85  Sars covid 19 negative   Chest radiograph with mild cardiomegaly bilateral hilar vascular congestion and bilateral interstitial infiltrates.   EKG 73 bpm, left axis deviation, qtc 497, sinus rhythm with frequent left ventricle PVC, poor R R wave progression, with no significant ST segment or T wave changes.  Patient underwent HD with ultrafiltration with improvement of his symptoms 09/08 HD and plan to have inpatient HD on 09/09 to continue fluid ultrafiltration.       Assessment and Plan: * Volume overload ESRD on HD  Hypokalemia and hyperphosphatemia   K is 4,0 and serum bicarbonate at 26.  His volume has improved but not back to baseline.  Plan for HD today and tomorrow as inpatient.   Likely patient has lost body weight and dry weight need to be modified. Continue to follow up on nephrology recommendations Continue with oral phosphate binders.  Anemia of  chronic renal disease, hgb has been stable at 11,7  Acute on chronic systolic heart failure [facility]) Patient has a decreased LV systolic function.  Continue with ultrafiltration for volume removal Advised about salt and fluid restrictions.  Systolic blood pressure 98 to 107 mmHg.  Continue with midodrine for blood pressure support.    Paroxysmal atrial fibrillation [facility]) Patient is on sinus rhythm, plan to continue telemetry monitoring.   Acute pulmonary embolism (HCC) Continue anticoagulation with apixaban.   Anemia of chronic disease Anemia of chronic renal disease.   Leukopenia and thrombocytopenia Poor prognosis sign Follow up cell count as outpatient.   COPD (chronic obstructive pulmonary disease) (HCC) No signs of exacerbation, continue oxymetry monitoring.         Subjective: patient with improvement in his symptoms but not back to baseline, no chest pain.   Physical Exam: Vitals:   09/14/21 2339 09/15/21 0357 09/15/21 0724 09/15/21 1109  BP: 98/68 123/76 107/69 109/77  Pulse: 73 75 76 69  Resp: '19 18 15 17  '$ Temp: 97.8 F (36.6 C) 97.9 F (36.6 C) 97.9 F (36.6 C) 97.9 F (36.6 C)  TempSrc: Oral Oral    SpO2: 100% 98% 100%   Weight:  78 kg    Height:       Neurology awake and alert ENT with mild pallor Cardiovascular with S1 and S2 present and rhythmic with no gallops or rubs Respiratory with rales at bases with no wheezing Abdomen with no distention  Trace lower extremity edema  Data Reviewed:  Family Communication: I spoke with patient's girlfriend at the bedside, we talked in detail about patient's condition, plan of care and prognosis and all questions were addressed.   Disposition: Status is: Inpatient Remains inpatient appropriate because: volume overload and need for inpatient hemodialysis   Planned Discharge Destination: Home   Author: Tawni Millers, MD 09/15/2021 2:44 PM  For on call review [email or web address].

## 2021-09-16 ENCOUNTER — Inpatient Hospital Stay (HOSPITAL_COMMUNITY): Payer: Medicare Other

## 2021-09-16 DIAGNOSIS — I5023 Acute on chronic systolic (congestive) heart failure: Secondary | ICD-10-CM | POA: Diagnosis not present

## 2021-09-16 DIAGNOSIS — D638 Anemia in other chronic diseases classified elsewhere: Secondary | ICD-10-CM | POA: Diagnosis not present

## 2021-09-16 DIAGNOSIS — I2699 Other pulmonary embolism without acute cor pulmonale: Secondary | ICD-10-CM | POA: Diagnosis not present

## 2021-09-16 DIAGNOSIS — E8770 Fluid overload, unspecified: Secondary | ICD-10-CM | POA: Diagnosis not present

## 2021-09-16 LAB — CBC
HCT: 36.1 % — ABNORMAL LOW (ref 39.0–52.0)
Hemoglobin: 11 g/dL — ABNORMAL LOW (ref 13.0–17.0)
MCH: 28.6 pg (ref 26.0–34.0)
MCHC: 30.5 g/dL (ref 30.0–36.0)
MCV: 93.8 fL (ref 80.0–100.0)
Platelets: 80 10*3/uL — ABNORMAL LOW (ref 150–400)
RBC: 3.85 MIL/uL — ABNORMAL LOW (ref 4.22–5.81)
RDW: 18.4 % — ABNORMAL HIGH (ref 11.5–15.5)
WBC: 3.8 10*3/uL — ABNORMAL LOW (ref 4.0–10.5)
nRBC: 0 % (ref 0.0–0.2)

## 2021-09-16 LAB — BASIC METABOLIC PANEL
Anion gap: 11 (ref 5–15)
BUN: 34 mg/dL — ABNORMAL HIGH (ref 6–20)
CO2: 27 mmol/L (ref 22–32)
Calcium: 9.5 mg/dL (ref 8.9–10.3)
Chloride: 98 mmol/L (ref 98–111)
Creatinine, Ser: 5.73 mg/dL — ABNORMAL HIGH (ref 0.61–1.24)
GFR, Estimated: 11 mL/min — ABNORMAL LOW (ref >60)
Glucose, Bld: 106 mg/dL — ABNORMAL HIGH (ref 70–99)
Potassium: 4 mmol/L (ref 3.5–5.1)
Sodium: 136 mmol/L (ref 135–145)

## 2021-09-16 MED ORDER — ALBUMIN HUMAN 25 % IV SOLN
25.0000 g | INTRAVENOUS | Status: DC | PRN
  Administered 2021-09-16: 25 g via INTRAVENOUS
  Filled 2021-09-16: qty 100

## 2021-09-16 MED ORDER — MIDODRINE HCL 5 MG PO TABS
10.0000 mg | ORAL_TABLET | Freq: Three times a day (TID) | ORAL | Status: DC
  Administered 2021-09-16 – 2021-09-17 (×4): 10 mg via ORAL
  Filled 2021-09-16 (×4): qty 2

## 2021-09-16 MED ORDER — HEPARIN SODIUM (PORCINE) 1000 UNIT/ML IJ SOLN
INTRAMUSCULAR | Status: AC
  Filled 2021-09-16: qty 4

## 2021-09-16 NOTE — Progress Notes (Signed)
[facility] Progress Note  Subjective: pt seen in room. SOB better, legs not as swollen.   Vitals:   09/15/21 2300 09/16/21 0208 09/16/21 0557 09/16/21 0909  BP:   108/67 98/80  Pulse:   79 76  Resp:   18 16  Temp:   97.9 F (36.6 C) 97.6 F (36.4 C)  TempSrc:    Oral  SpO2:  96% 100% 100%  Weight: 74.9 kg     Height:        Exam: Gen alert, no distress No jvd or bruits Chest clear bilat RRR no MRG Abd soft ntnd no mass or ascites +bs Ext 1+ bilat pretib edema, improving Neuro is alert, Ox 3 , nf    RIJ TDC in place    Home meds include - albuterol, apixaban, colace, advair diskus, midodrine 5 tid, montelukast, pantoprazole, trazodone, velphoro 1 gm ac tid, prns/ vits/ supps   CXR 9/06 - IMPRESSION: 1. Cardiomegaly with mild pulmonary edema. 2. 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     OP HD: SW TTS 4h  450/500   73.5kg  2/2 bath  P2   TDC   Hep none - hep B labs done 8/30 - last HD 9/5, post 74kg, ave UF 2-3kg - etelcalcetide 7.5 mg iv tiw - last Hb 11.5, not on esa or Fe    Assessment/ Plan: SOB /vol overload - admit CXR w/ IS edema and RLL scarring, admit exam w/ +sig LE edema + recent CTA of chest 8/28 showed extensive GG changes c/w edema. Got 2.9 L off 1st day but yesterday did not tolerate UF, close to dry wt today. Breathing better and LE edema better, will attempt to wean O2 off.  This may be as good as it gets. Will ^midodrine to 10 tid and give IV albumin w/ HD today, try to uf 2-2.5 L.  ESRD - on HD TTS. HD extra here yesterday, HD again today as above.  BP - BPs are wnl, follow w/ vol removal. Not on any BP lowering meds at home, will ^midodrine to 10 mg tid.  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 Follow, cont  binder  Rob Suede [place] 09/16/2021, 11:51 AM   Recent Labs  Lab 09/14/21 0250 09/14/21 0302 09/15/21 0542 09/16/21 0625  HGB 10.0* 10.5*  --   --   ALBUMIN 2.5*  --  3.1*  --   CALCIUM 8.0*  --  9.7 9.5  PHOS 6.6*  --  5.6*  --   CREATININE 8.21*  --  7.19* 5.73*  K 3.4* 3.5 4.0 4.0    No results for input(s): "IRON", "TIBC", "FERRITIN" in the last 168 hours. Inpatient medications:  apixaban  5 mg Oral BID   calcium acetate  1,334 mg Oral TID WC   Chlorhexidine Gluconate Cloth  6 each Topical Q0600   Chlorhexidine Gluconate Cloth  6 each Topical Q0600   fluticasone furoate-vilanterol  1 puff Inhalation Daily   midodrine  10 mg Oral TID WC   montelukast  10 mg Oral Daily   sucroferric oxyhydroxide  1,000 mg Oral BID PC    acetaminophen **OR** acetaminophen, albuterol

## 2021-09-16 NOTE — Progress Notes (Signed)
Received patient in bed to unit.  Received patient in bed to unit.  Alert and oriented.  Informed consent signed and in chart.   Treatment initiated: 1436 Treatment completed: 1856  Patient tolerated well.  Transported back to the room  Alert, without acute distress.  Hand-off given to patient's nurse.   Access used: Cath Access issues: none  Total UF removed: 3.0L Medication(s) given: Midodrine, Albumin Post HD VS: 99/67,75,20,98.1 Post HD weight: 71.3kg        [facility]

## 2021-09-16 NOTE — Progress Notes (Signed)
Progress Note   Patient: Nathaniel White [id number]:[id number] DOB: 12/28/1960 DOA: 09/13/2021     2 DOS: the patient was seen and examined on 09/16/2021   Brief hospital course: Nathaniel White was admitted to the hospital with the working diagnosis of volume overload in the setting of ESRD.   61 yo male with the past medical history of ERSD on HD, pulmonary embolism, systolic heart failure and severe mitral regurgitation. Atrial fibrillation, COPD and left hemiparesis. Recent hospitalization for pulmonary embolism on 08.28 to 09.01.23.  Patient reported 2 days of progressive dyspnea, with worsening lower extremity edema and orthopnea. On his initial physical examination his blood pressure was 120/95, HR 70 to 77, RR 17 to 20 and 02 saturation 99% on supplemental 02 per , lungs with no wheezing or rhonchi, heart with S1 and S2 present irregular, abdomen not distended, positive lower extremity edema.   Na 141, K 4,0 CL 99, bicarbonate 26 glucose 99, bun 45 cr 8,7  BNP 3,508 High sensitive troponin 75 and 74  Wbc 3,4 hgb 11,7 plt 85  Sars covid 19 negative   Chest radiograph with mild cardiomegaly bilateral hilar vascular congestion and bilateral interstitial infiltrates.   EKG 73 bpm, left axis deviation, qtc 497, sinus rhythm with frequent left ventricle PVC, poor R R wave progression, with no significant ST segment or T wave changes.  Patient underwent HD with ultrafiltration with improvement of his symptoms 09/08, 09/08 consecutive days of hemodialysis.        Assessment and Plan: * Volume overload ESRD on HD  Hypokalemia and hyperphosphatemia   His renal function this am with serum K at 4,0 and serum bicarbonate at 27. Not able to remove much fluid yesterday  Plan for 3 day of consecutive HD today to attempt further fluid removal.   Likely patient has lost body weight and dry weight need to be modified. Continue with oral phosphate binders.   Anemia of chronic renal disease, hgb has  been stable at 11,7  Acute on chronic hypoxemic respiratory failure, patient is on home 02 3 L/min.   Acute on chronic systolic heart failure ([place]) Patient has a decreased LV systolic function.  Blood pressure 98 to 120 mmHg Continue attempt fluid removal per HD and ultrafiltration.    Paroxysmal atrial fibrillation [facility]) Patient is on sinus rhythm, plan to continue telemetry monitoring.   Acute pulmonary embolism (HCC) Continue anticoagulation with apixaban.   Anemia of chronic disease Anemia of chronic renal disease.   Leukopenia and thrombocytopenia Poor prognosis sign Follow up cell count as outpatient.   COPD (chronic obstructive pulmonary disease) (HCC) No signs of exacerbation, continue oxymetry monitoring.  Patient has chronic hypoxemic respiratory failure and is on home 02 (3 L/min).         Subjective: Patient with no chest pain, his dyspnea continue to improve, he is very weak and deconditioned   Physical Exam: Vitals:   09/16/21 0557 09/16/21 0909 09/16/21 1430 09/16/21 1436  BP: 108/67 98/80 (!) 124/111   Pulse: 79 76 68 69  Resp: '18 16 18 16  '$ Temp: 97.9 F (36.6 C) 97.6 F (36.4 C) 98.1 F (36.7 C)   TempSrc:  Oral Oral   SpO2: 100% 100% 100% 98%  Weight:      Height:       Neurology awake and alert ENT with mild pallor Cardiovascular with S1 and S2 present and rhythmic with no gallops Respiratory with no rales or wheezing Abdomen with no distention  Trace  lower extremity edema  Data Reviewed:    Family Communication: no family at the bedside   Disposition: Status is: Inpatient Remains inpatient appropriate because: inpatient renal replacement therapy   Planned Discharge Destination: Home     Author: Tawni Millers, MD 09/16/2021 3:20 PM  For on call review [email or web address].

## 2021-09-16 NOTE — Plan of Care (Signed)
  Problem: Education: Goal: Knowledge of General Education information will improve Description: Including pain rating scale, medication(s)/side effects and non-pharmacologic comfort measures Outcome: Completed/Met

## 2021-09-17 ENCOUNTER — Inpatient Hospital Stay (HOSPITAL_COMMUNITY): Payer: Medicare Other

## 2021-09-17 DIAGNOSIS — I5023 Acute on chronic systolic (congestive) heart failure: Secondary | ICD-10-CM | POA: Diagnosis not present

## 2021-09-17 DIAGNOSIS — E8770 Fluid overload, unspecified: Secondary | ICD-10-CM | POA: Diagnosis not present

## 2021-09-17 DIAGNOSIS — I2699 Other pulmonary embolism without acute cor pulmonale: Secondary | ICD-10-CM | POA: Diagnosis not present

## 2021-09-17 DIAGNOSIS — J449 Chronic obstructive pulmonary disease, unspecified: Secondary | ICD-10-CM | POA: Diagnosis not present

## 2021-09-17 LAB — BASIC METABOLIC PANEL
Anion gap: 11 (ref 5–15)
BUN: 22 mg/dL — ABNORMAL HIGH (ref 6–20)
CO2: 27 mmol/L (ref 22–32)
Calcium: 9.9 mg/dL (ref 8.9–10.3)
Chloride: 98 mmol/L (ref 98–111)
Creatinine, Ser: 4.15 mg/dL — ABNORMAL HIGH (ref 0.61–1.24)
GFR, Estimated: 16 mL/min — ABNORMAL LOW (ref >60)
Glucose, Bld: 107 mg/dL — ABNORMAL HIGH (ref 70–99)
Potassium: 3.8 mmol/L (ref 3.5–5.1)
Sodium: 136 mmol/L (ref 135–145)

## 2021-09-17 MED ORDER — CALCIUM ACETATE (PHOS BINDER) 667 MG PO CAPS: 1334.0000 mg | ORAL_CAPSULE | Freq: Three times a day (TID) | ORAL | 0 refills | Status: DC

## 2021-09-17 MED ORDER — APIXABAN 5 MG PO TABS: 5.0000 mg | ORAL_TABLET | Freq: Two times a day (BID) | ORAL | 0 refills | Status: DC

## 2021-09-17 MED ORDER — MIDODRINE HCL 10 MG PO TABS: 10.0000 mg | ORAL_TABLET | Freq: Three times a day (TID) | ORAL | 0 refills | Status: DC

## 2021-09-17 MED ORDER — MIDODRINE HCL 10 MG PO TABS
10.0000 mg | ORAL_TABLET | Freq: Three times a day (TID) | ORAL | 0 refills | Status: AC
Start: 2021-09-17 — End: 2021-10-17

## 2021-09-17 NOTE — Discharge Summary (Signed)
Physician Discharge Summary   Patient: Nathaniel White MRN: [id number] DOB: September 06, 1960  Admit date:     09/13/2021  Discharge date: 09/17/21  Discharge Physician: Jimmy Picket Junior Huezo   PCP: Gildardo Pounds, NP   Recommendations at discharge:    Patient will continue outpatient renal replacement therapy Midodrine has been increased to 10 mg tid Continue anticoagulation with apixaban Patient with severe pulmonary hypertension, poor prognosis.   Discharge Diagnoses: Principal Problem:   Volume overload Active Problems:   Acute on chronic systolic heart failure (HCC)   Paroxysmal atrial fibrillation (HCC)   Acute pulmonary embolism (HCC)   Anemia of chronic disease   COPD (chronic obstructive pulmonary disease) (HCC)  Resolved Problems:   * No resolved hospital problems. [facility] Course: Mr. Snelson was admitted to the hospital with the working diagnosis of volume overload in the setting of ESRD.   61 yo male with the past medical history of ERSD on HD, pulmonary embolism, systolic heart failure and severe mitral regurgitation. Atrial fibrillation, COPD and left hemiparesis. Recent hospitalization for pulmonary embolism on 08.28 to 09.01.23.  Patient reported 2 days of progressive dyspnea, with worsening lower extremity edema and orthopnea. On his initial physical examination his blood pressure was 120/95, HR 70 to 77, RR 17 to 20 and 02 saturation 99% on supplemental 02 per [place], lungs with no wheezing or rhonchi, heart with S1 and S2 present irregular, abdomen not distended, positive lower extremity edema.   Na 141, K 4,0 CL 99, bicarbonate 26 glucose 99, bun 45 cr 8,7  BNP 3,508 High sensitive troponin 75 and 74  Wbc 3,4 hgb 11,7 plt 85  Sars covid 19 negative   Chest radiograph with mild cardiomegaly bilateral hilar vascular congestion and bilateral interstitial infiltrates.   EKG 73 bpm, left axis deviation, qtc 497, sinus rhythm with frequent left ventricle PVC, poor R R  wave progression, with no significant ST segment or T wave changes.  Patient underwent HD with ultrafiltration with improvement of his symptoms 09/08, 09/08, 09/09 consecutive days of hemodialysis. At the time of his discharge he is feeling back to his baseline, he dose have supplemental home 02. Plan to continue renal replacement therapy as outpatient.        Assessment and Plan: * Volume overload ESRD on HD (access right IJ TDC) Hypokalemia and hyperphosphatemia   Patient underwent 3 consecutive days hemodialysis with good toleration. At the time of his discharge he is feeling back to his baseline.  He has lost 5 kg since admission and his discharge weight is 73.2 kg.   Likely patient has lost body weight and dry weight need to be modified as outpatient.  Continue with oral phosphate binders.   Anemia of chronic renal disease, hgb has been stable at 11,7 Iron deficiency anemia, continue with oral iron supplementation.   Acute on chronic hypoxemic respiratory failure, patient is on home 02 3 L/min.   Acute on chronic systolic heart failure (HCC) Echocardiogram with reduced LV systolic function with EF 30 to 35%, global hypokinesis, severe LVH, RV systolic function with mild reduction, RV cavity with severe dilatation, severe elevated pulmonary artery systolic pressure, RVSP 99,3 mmHg. LA with severe dilatation, moderate to severe MR. Moderate to severe TR.   Acute on chronic core pulmonale Pulmonary hypertension.   Blood pressure 98 to 120 mmHg Midodrine will be increase to 10 mg tid for blood pressure support.  Continue attempt fluid removal per HD and ultrafiltration.    Paroxysmal  atrial fibrillation [facility]) Patient is on sinus rhythm, plan to continue telemetry monitoring.  He on full anticoagulation for pulmonary embolism.   Acute pulmonary embolism (HCC) Continue anticoagulation with apixaban.   Anemia of chronic disease Anemia of chronic renal disease.    Leukopenia and thrombocytopenia Poor prognosis sign Follow up cell count as outpatient.   COPD (chronic obstructive pulmonary disease) (HCC) No signs of exacerbation, continue oxymetry monitoring.  Patient has chronic hypoxemic respiratory failure and is on home 02 (3 L/min).          Consultants: nephrology  Procedures performed: none   Disposition: Home Diet recommendation:  Cardiac diet DISCHARGE MEDICATION: Allergies as of 09/17/2021       Reactions   Codeine Shortness Of Breath   Dextromethorphan-guaifenesin Shortness Of Breath   Iron Dextran Shortness Of Breath   Morphine And Related Shortness Of Breath        Medication List     STOP taking these medications    docusate sodium 100 MG capsule Commonly known as: COLACE   pantoprazole 40 MG tablet Commonly known as: Protonix       TAKE these medications    acetaminophen 500 MG tablet Commonly known as: TYLENOL Take 1 tablet (500 mg total) by mouth every 6 (six) hours as needed. What changed:  how much to take when to take this reasons to take this   albuterol 108 (90 Base) MCG/ACT inhaler Commonly known as: ProAir HFA Inhale 2 puffs into the lungs every 6 (six) hours as needed for wheezing or shortness of breath. What changed:  how much to take when to take this   albuterol (2.5 MG/3ML) 0.083% nebulizer solution Commonly known as: PROVENTIL Take 3 mLs (2.5 mg total) by nebulization every 6 (six) hours as needed for wheezing or shortness of breath. What changed: when to take this   Eliquis DVT/PE Starter Pack Generic drug: Apixaban Starter Pack ('10mg'$  and '5mg'$ ) Take as directed on package: start with two-'5mg'$  tablets twice daily for 7 days. On day 8, switch to one-'5mg'$  tablet twice daily.   fluticasone-salmeterol 250-50 MCG/ACT Aepb Commonly known as: Advair Diskus Inhale 1 puff into the lungs in the morning and at bedtime. NEEDS PASS What changed:  when to take this reasons to take  this additional instructions   hydrOXYzine 10 MG tablet Commonly known as: ATARAX Take 1 tablet (10 mg total) by mouth 3 (three) times daily as needed. For anxiety What changed:  when to take this reasons to take this additional instructions   IRON PO Take 1 tablet by mouth in the morning, at noon, and at bedtime.   midodrine 10 MG tablet Commonly known as: PROAMATINE Take 1 tablet (10 mg total) by mouth 3 (three) times daily with meals. What changed:  medication strength how much to take   montelukast 10 MG tablet Commonly known as: SINGULAIR Take 1 tablet (10 mg total) by mouth at bedtime. What changed: when to take this   traZODone 50 MG tablet Commonly known as: DESYREL Take 1 tablet (50 mg total) by mouth at bedtime as needed for sleep.   Velphoro 500 MG chewable tablet Generic drug: sucroferric oxyhydroxide Chew 1,000 mg by mouth 2 (two) times daily after a meal.   Visine 0.05 % ophthalmic solution Generic drug: tetrahydrozoline Place 2-3 drops into both eyes 2 (two) times daily as needed (dry eye).        Follow-up Information     Care, [facility] Follow up.  Specialty: [place] Why: HHPT, [facility] will call to set up apt Contact information: [address] [place] [address] [phone]                Discharge Exam: Filed Weights   09/15/21 2029 09/15/21 2300 09/17/21 0532  Weight: 74.7 kg 74.9 kg 73.2 kg   BP (!) 98/53   Pulse 80   Temp 98.3 F (36.8 C)   Resp 19   Ht '5\' 9"'$  (1.753 m)   Wt 73.2 kg   SpO2 94%   BMI 23.83 kg/m   Patient with no dyspnea or chest pain, no edema  Neurology awake and alert ENT with mild pallor Cardiovascular with S1 and S2 present, irregularly irregular with no gallops, rubs or murmurs No JVD No lower extremity edema  Respiratory with no rales or wheezing Abdomen with no distention   Condition at discharge: stable  The results of significant diagnostics  from this hospitalization (including imaging, microbiology, ancillary and laboratory) are listed below for reference.   Imaging Studies: DG CHEST PORT 1 VIEW  Result Date: 09/17/2021 CLINICAL DATA:  Shortness of breath. EXAM: PORTABLE CHEST 1 VIEW COMPARISON:  09/16/2021, 09/04/2021 and CT 09/04/2021 FINDINGS: Left IJ central venous catheter unchanged. Lungs are adequately inflated with persistent hazy bilateral perihilar opacification likely mild interstitial edema. Scarring over the lateral right mid to lower lung. Possible slight interval worsening of patchy hazy opacification over the left midlung and right base as cannot exclude superimposed infectious process. Chronic minimal blunting right costophrenic angle. Pleural calcification over the right diaphragm. Stable cardiomegaly. Remainder of the exam is unchanged. IMPRESSION: 1. Persistent hazy perihilar opacification likely mild interstitial edema. Possible slight interval worsening patchy hazy opacification over the left midlung and right base as cannot exclude superimposed infectious process. 2. Stable cardiomegaly. Electronically Signed   By: Marin Olp M.D.   On: 09/17/2021 09:12   DG CHEST PORT 1 VIEW  Result Date: 09/16/2021 CLINICAL DATA:  Shortness of breath. EXAM: PORTABLE CHEST 1 VIEW COMPARISON:  09/13/2021, 07/10/2021 FINDINGS: Left IJ central venous catheter unchanged. Lungs are adequately inflated demonstrate continued hazy bilateral perihilar opacification suggesting mild interstitial edema. Scarring over the right mid to lower lung. Stable mild blunting of the right costophrenic angle. Stable cardiomegaly. Remainder of the exam is unchanged. IMPRESSION: 1. Stable cardiomegaly with findings suggesting mild interstitial edema unchanged. 2. Scarring right mid to lower lung. Stable blunting right costophrenic angle. Electronically Signed   By: Marin Olp M.D.   On: 09/16/2021 12:19   DG Chest 2 View  Result Date:  09/13/2021 CLINICAL DATA:  SOB -  crackles in left base EXAM: CHEST - 2 VIEW COMPARISON:  Chest x-ray 09/04/2021, CT angiography chest 09/04/2021 FINDINGS: Left chest wall dialysis catheter with tip overlying the expected region of the superior cavoatrial junction. Cardiomegaly. The heart and mediastinal contours are unchanged. Atherosclerotic plaque. Persistent patchy right lower lung zone airspace opacity. Persistently increased interstitial markings. Persistent trace volume right pleural effusion. Redemonstration of pleural calcification on the right. No pneumothorax. No acute osseous abnormality. IMPRESSION: 1. Cardiomegaly with mild pulmonary edema. 2. 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 Followup PA and lateral chest X-ray is recommended in 3-4 weeks following therapy to ensure resolution and exclude underlying malignancy. 3.  Aortic Atherosclerosis (ICD10-I70.0). Electronically Signed   By: Iven Finn M.D.   On: 09/13/2021 17:35  VAS [place] LOWER EXTREMITY VENOUS (DVT)  Result Date: 09/05/2021  Lower Venous DVT Study Patient Name:  KRISHIV SANDLER  Date of Exam:   09/05/2021 Medical Rec #: [id number]        Accession #:    [id number] Date of Birth: [birth year]        Patient Gender: M Patient Age:   61 years Exam Location:  [facility] Procedure:      VAS [place] LOWER EXTREMITY VENOUS (DVT) Referring Phys: Gean Birchwood --------------------------------------------------------------------------------  Indications: Pulmonary embolism.  Limitations: Poor ultrasound/tissue interface. Comparison Study: no prior Performing Technologist: Archie Patten RVS  Examination Guidelines: A complete evaluation includes B-mode imaging, spectral Doppler, color Doppler, and power Doppler as needed of all accessible portions of each vessel. Bilateral  testing is considered an integral part of a complete examination. Limited examinations for reoccurring indications may be performed as noted. The reflux portion of the exam is performed with the patient in reverse Trendelenburg.  +--------+---------------+---------+-----------+----------+--------------------+ RIGHT   CompressibilityPhasicitySpontaneityPropertiesThrombus Aging       +--------+---------------+---------+-----------+----------+--------------------+ CFV     Full           Yes      Yes                                       +--------+---------------+---------+-----------+----------+--------------------+ SFJ     Full                                                              +--------+---------------+---------+-----------+----------+--------------------+ FV Prox Full                                                              +--------+---------------+---------+-----------+----------+--------------------+ FV Mid  Full                                                              +--------+---------------+---------+-----------+----------+--------------------+ FV      Full                                                              Distal                                                                    +--------+---------------+---------+-----------+----------+--------------------+ PFV     Full                                                              +--------+---------------+---------+-----------+----------+--------------------+  POP     Full           Yes      Yes                                       +--------+---------------+---------+-----------+----------+--------------------+ PTV                    Yes      Yes                  patent by color                                                           doppler              +--------+---------------+---------+-----------+----------+--------------------+ PERO                                                  Not well visualized  +--------+---------------+---------+-----------+----------+--------------------+   +--------+---------------+---------+-----------+----------+--------------------+ LEFT    CompressibilityPhasicitySpontaneityPropertiesThrombus Aging       +--------+---------------+---------+-----------+----------+--------------------+ CFV     Full           Yes      Yes                                       +--------+---------------+---------+-----------+----------+--------------------+ SFJ     Full                                                              +--------+---------------+---------+-----------+----------+--------------------+ FV Prox Full                                                              +--------+---------------+---------+-----------+----------+--------------------+ FV Mid  Full                                                              +--------+---------------+---------+-----------+----------+--------------------+ FV      Full                                                              Distal                                                                    +--------+---------------+---------+-----------+----------+--------------------+  PFV     Full                                                              +--------+---------------+---------+-----------+----------+--------------------+ POP     Full           Yes      Yes                                       +--------+---------------+---------+-----------+----------+--------------------+ PTV                    Yes      Yes                  patent by color                                                           doppler              +--------+---------------+---------+-----------+----------+--------------------+ PERO                                                 Not well visualized   +--------+---------------+---------+-----------+----------+--------------------+     Summary: BILATERAL: - No evidence of deep vein thrombosis seen in the lower extremities, bilaterally. -No evidence of popliteal cyst, bilaterally.   *See table(s) above for measurements and observations. Electronically signed by Deitra Mayo MD on 09/05/2021 at 4:52:51 PM.    Final    ECHOCARDIOGRAM COMPLETE  Result Date: 09/05/2021    ECHOCARDIOGRAM REPORT   Patient Name:   BOHDI LEEDS Date of Exam: 09/05/2021 Medical Rec #:  [id number]       Height:       69.0 in Accession #:    [id number]      Weight:       165.8 lb Date of Birth:  09/01/1960       BSA:          1.908 m Patient Age:    25 years        BP:           127/98 mmHg Patient Gender: M               HR:           68 bpm. Exam Location:  Inpatient Procedure: 2D Echo, Cardiac Doppler, Color Doppler and Intracardiac            Opacification Agent                                 MODIFIED REPORT:     This report was modified by Rudean Haskell MD on 09/05/2021 due to  Clarification of recommendations.  Indications:     Pulmonary embolus  History:         Patient has prior history of Echocardiogram examinations, most                  recent 03/13/2021. CHF, COPD, Arrythmias:Atrial Fibrillation,                  Signs/Symptoms:Shortness of Breath; Risk Factors:Current                  Smoker, Hypertension and Diabetes. Hx stroke. ESRD.  Sonographer:     Clayton Lefort RDCS (AE) Referring Phys:  [id number]ore[address] Lew [facility] Diagnosing Phys: Rudean Haskell MD IMPRESSIONS  1. Left ventricular ejection fraction, by estimation, is 30 to 35%. The left ventricle has moderately decreased function. The left ventricle demonstrates global hypokinesis. There is severe concentric left ventricular hypertrophy. Left ventricular diastolic parameters are indeterminate.  2. Right ventricular systolic function is moderately reduced. The right ventricular size is  severely enlarged. There is severely elevated pulmonary artery systolic pressure. The estimated right ventricular systolic pressure is 47.0 mmHg.  3. Left atrial size was severely dilated.  4. Right atrial size was mildly dilated.  5. Regurgitant volume 42 ml, No right sided pulmonary vein systolic blunting. There is a mixed etiology of mitral regurgitation: posterior restriction and functional MR. Calcified mitral valve with at least mild mitral regurgitation. The mitral valve is  degenerative. Moderate to severe mitral valve regurgitation. Mild mitral stenosis. The mean mitral valve gradient is 5.0 mmHg with average heart rate of 79 bpm. Moderate mitral annular calcification.  6. Tricuspid valve regurgitation is moderate to severe.  7. The aortic valve is tricuspid. There is mild calcification of the aortic valve. There is mild thickening of the aortic valve. Aortic valve regurgitation is not visualized. Aortic valve sclerosis is present, with no evidence of aortic valve stenosis. Comparison(s): Prior images reviewed side by side. Increase in mitral valve and tricuspid valve regurgitation from prior. When patient is at dry weight, consideration of TEE for characterization of MR/MS is reasonable (inpatient vs outpatient). FINDINGS  Left Ventricle: Left ventricular ejection fraction, by estimation, is 30 to 35%. The left ventricle has moderately decreased function. The left ventricle demonstrates global hypokinesis. Definity contrast agent was given IV to delineate the left ventricular endocardial borders. The left ventricular internal cavity size was normal in size. There is severe concentric left ventricular hypertrophy. Left ventricular diastolic parameters are indeterminate. Right Ventricle: The right ventricular size is severely enlarged. No increase in right ventricular wall thickness. Right ventricular systolic function is moderately reduced. There is severely elevated pulmonary artery systolic pressure. The  tricuspid regurgitant velocity is 3.69 m/s, and with an assumed right atrial pressure of 8 mmHg, the estimated right ventricular systolic pressure is 96.2 mmHg. Left Atrium: Left atrial size was severely dilated. Right Atrium: Right atrial size was mildly dilated. Pericardium: There is no evidence of pericardial effusion. Mitral Valve: Regurgitant volume 42 ml, No right sided pulmonary vein systolic blunting. There is a mixed etiology of mitral regurgitation: posterior restriction and functional MR. Calcified mitral valve with at least mild mitral regurgitation. The mitral valve is degenerative in appearance. Moderate mitral annular calcification. Moderate to severe mitral valve regurgitation. Mild mitral valve stenosis. The mean mitral valve gradient is 5.0 mmHg with average heart rate of 79 bpm. Tricuspid Valve: The tricuspid valve is normal in structure. Tricuspid valve regurgitation is moderate to severe. Aortic Valve: The aortic valve  is tricuspid. There is mild calcification of the aortic valve. There is mild thickening of the aortic valve. There is moderate aortic valve annular calcification. Aortic valve regurgitation is not visualized. Aortic valve sclerosis is present, with no evidence of aortic valve stenosis. Aortic valve mean gradient measures 2.0 mmHg. Aortic valve peak gradient measures 4.5 mmHg. Aortic valve area, by VTI measures 1.47 cm. Pulmonic Valve: The pulmonic valve was normal in structure. Pulmonic valve regurgitation is trivial. No evidence of pulmonic stenosis. Aorta: The aortic root is normal in size and structure. IAS/Shunts: No atrial level shunt detected by color flow Doppler.  LEFT VENTRICLE PLAX 2D LVIDd:         5.50 cm LVIDs:         4.60 cm LV PW:         1.60 cm LV IVS:        1.50 cm LVOT diam:     2.10 cm LV SV:         28 LV SV Index:   15 LVOT Area:     3.46 cm  LV Volumes (MOD) LV vol d, MOD A2C: 188.0 ml LV vol d, MOD A4C: 186.0 ml LV vol s, MOD A2C: 117.0 ml LV vol s, MOD  A4C: 127.0 ml LV SV MOD A2C:     71.0 ml LV SV MOD A4C:     186.0 ml LV SV MOD BP:      62.7 ml RIGHT VENTRICLE            IVC RV S prime:     5.08 cm/s  IVC diam: 2.00 cm TAPSE (M-mode): 0.7 cm LEFT ATRIUM            Index        RIGHT ATRIUM           Index LA diam:      4.60 cm  2.41 cm/m   RA Area:     22.80 cm LA Vol (A4C): 101.0 ml 52.94 ml/m  RA Volume:   66.00 ml  34.60 ml/m  AORTIC VALVE AV Area (Vmax):    1.65 cm AV Area (Vmean):   1.38 cm AV Area (VTI):     1.47 cm AV Vmax:           106.00 cm/s AV Vmean:          72.700 cm/s AV VTI:            0.191 m AV Peak Grad:      4.5 mmHg AV Mean Grad:      2.0 mmHg LVOT Vmax:         50.40 cm/s LVOT Vmean:        29.000 cm/s LVOT VTI:          0.081 m LVOT/AV VTI ratio: 0.43  AORTA Ao Root diam: 3.20 cm Ao Asc diam:  3.30 cm MITRAL VALVE                  TRICUSPID VALVE MV Mean grad: 5.0 mmHg        TR Peak grad:   54.5 mmHg MR Peak grad:    79.6 mmHg    TR Vmax:        369.00 cm/s MR Mean grad:    42.0 mmHg MR Vmax:         446.00 cm/s  SHUNTS MR Vmean:        290.0 cm/s   Systemic VTI:  0.08 m  MR PISA:         4.02 cm     Systemic Diam: 2.10 cm MR PISA Eff ROA: 35 mm MR PISA Radius:  0.80 cm Rudean Haskell MD Electronically signed by Rudean Haskell MD Signature Date/Time: 09/05/2021/9:40:38 AM    Final (Updated)    CT Angio Chest PE W and/or Wo Contrast  Result Date: 09/04/2021 CLINICAL DATA:  Shortness of breath EXAM: CT ANGIOGRAPHY CHEST WITH CONTRAST TECHNIQUE: Multidetector CT imaging of the chest was performed using the standard protocol during bolus administration of intravenous contrast. Multiplanar CT image reconstructions and MIPs were obtained to evaluate the vascular anatomy. RADIATION DOSE REDUCTION: This exam was performed according to the departmental dose-optimization program which includes automated exposure control, adjustment of the mA and/or kV according to patient size and/or use of iterative reconstruction technique.  CONTRAST:  70m OMNIPAQUE IOHEXOL 350 MG/ML SOLN COMPARISON:  Noncontrast chest CT 06/21/2021 FINDINGS: Cardiovascular: Cardiomegaly. Preferential dilatation of the right heart with 1.74 RV to LV ratio and hepatic venous reflux, likely chronic when compared to prior and when considering the pulmonary arteries. There is non enhancement asymmetrically involving right lower lobe segmental arteries. Some mixing artifact is noted within the pulmonary arteries, worse in the right lung. Pulmonary arteries show chronic diffuse wall calcification which is linear. Atheromatous calcification. Perma catheter on the left in good position. Mediastinum/Nodes: Negative for adenopathy or mass. Lungs/Pleura: Pleural thickening with calcification asymmetrically involving the right hemithorax. There are bands of opacity in the bilateral lungs from atelectasis/scarring, similar to prior. Improvement in left lower lobe aeration since prior. Upper Abdomen: No acute finding. Partially covered polycystic kidneys. Musculoskeletal: Generalized osteopenia. Review of the MIP images confirms the above findings. Critical Value/emergent results were called by telephone at the time of interpretation on 09/04/2021 at 4:45 am [facility] who verbally acknowledged these results. IMPRESSION: 1. Positive for right lower lobe segmental branch clot. There is a background of pulmonary hypertension with dilated right heart and pulmonary artery calcification. 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 Chronic lung disease; improved left lower lobe aeration since June 2023. 3.  Aortic Atherosclerosis (ICD10-I70.0). Electronically Signed   By: JJorje GuildM.D.   On: 09/04/2021 04:47   DG Chest Portable 1 View  Result Date: 09/04/2021 CLINICAL DATA:  Shortness of breath EXAM: PORTABLE CHEST 1 VIEW COMPARISON:  07/10/2021 FINDINGS: Scarring in the right mid/lower lung and lingula. Right apical pleural-parenchymal scarring. No focal consolidation.  Chronic blunting of the right costophrenic angle. No pleural effusion or pneumothorax. Stable cardiomegaly. Left chest dual lumen dialysis catheter terminating at the cavoatrial junction. IMPRESSION: Stable cardiomegaly. Scattered scarring. No evidence of acute cardiopulmonary disease. Electronically Signed   By: SJulian HyM.D.   On: 09/04/2021 01:51   VAS [place]PPER EXT VEIN MAPPING (PRE-OP AVF)  Result Date: 08/18/2021 UPPER EXTREMITY VEIN MAPPING Patient Name:  WOLAJUWON FOSDICK Date of Exam:   08/18/2021 Medical Rec #: [id number]       Accession #:    [id number] of Birth: 814-Jul-1962       Patient Gender: M Patient Age:   89years [facility] Procedure:      VAS [place] EXT VEIN MAPPING (PRE-OP AVF) Referring Phys: CKaroline Caldwell--------------------------------------------------------------------------------  Indications: Pre-access. History: Ligation of left arteriovenous fistula 06/20/2021. Right forearm graft  Limitations: Left upper arm contracted Performing Technologist: SAlvia GroveRVT  Examination Guidelines: A complete evaluation includes B-mode imaging, spectral  Doppler, color Doppler, and power Doppler as needed of all accessible portions of each vessel. Bilateral testing is considered an integral part of a complete examination. Limited examinations for reoccurring indications may be performed as noted. +-----------------+-------------+----------+--------------+ Right Cephalic   Diameter (cm)Depth (cm)   Findings    +-----------------+-------------+----------+--------------+ Shoulder                                not visualized +-----------------+-------------+----------+--------------+ Prox upper arm                          not visualized +-----------------+-------------+----------+--------------+ Mid upper arm                           not visualized +-----------------+-------------+----------+--------------+ Dist upper arm                           not visualized +-----------------+-------------+----------+--------------+ Antecubital fossa    0.14               not visualized +-----------------+-------------+----------+--------------+ Prox forearm                            not visualized +-----------------+-------------+----------+--------------+ Mid forearm          0.11                   graft      +-----------------+-------------+----------+--------------+ +-----------------+-------------+----------+--------+ Right Basilic    Diameter (cm)Depth (cm)Findings +-----------------+-------------+----------+--------+ Prox upper arm       0.53                        +-----------------+-------------+----------+--------+ Mid upper arm        0.45                        +-----------------+-------------+----------+--------+ Dist upper arm       0.24                        +-----------------+-------------+----------+--------+ Antecubital fossa    0.18                        +-----------------+-------------+----------+--------+ Prox forearm         0.18                        +-----------------+-------------+----------+--------+ Summary:   Measurements above. *See table(s) above for measurements and observations.  Diagnosing physician: Orlie Pollen Electronically signed by Orlie Pollen on 08/18/2021 at 5:38:01 PM.    Final    VAS [place] UPPER EXTREMITY ARTERIAL DUPLEX  Result Date: 08/18/2021  UPPER EXTREMITY DUPLEX STUDY Patient Name:  ZEIN HELBING  Date of Exam:   08/18/2021 Medical Rec #: [id number]        Accession #:    [id number] Date of Birth: 08-27-1960        Patient Gender: M Patient Age:   28 years [facility] Procedure:      VAS [place] UPPER EXTREMITY ARTERIAL DUPLEX Referring Phys: Vonna Kotyk ROBINS --------------------------------------------------------------------------------  Indications: Pre-operative exam.  Risk Factors:  Hypertension, Diabetes, past history  of smoking, prior CVA. Other Factors: 06/20/2021 Ligation of arteriovenous fistula on the  left. Right                forearm graft Limitations: Left arm contracted. Performing Technologist: Alvia Grove RVT  Examination Guidelines: A complete evaluation includes B-mode imaging, spectral Doppler, color Doppler, and power Doppler as needed of all accessible portions of each vessel. Bilateral testing is considered an integral part of a complete examination. Limited examinations for reoccurring indications may be performed as noted.  Right Pre-Dialysis Findings: +-----------------------+----------+--------------------+---------+--------+ Location               PSV (cm/s)Intralum. Diam. (cm)Waveform Comments +-----------------------+----------+--------------------+---------+--------+ Brachial Antecub. fossa40        0.74                triphasic         +-----------------------+----------+--------------------+---------+--------+ Radial Art at Wrist    56        0.26                triphasic         +-----------------------+----------+--------------------+---------+--------+ Ulnar Art at Wrist     50        0.15                triphasic         +-----------------------+----------+--------------------+---------+--------+  Left Pre-Dialysis Findings: +-----------------------+----------+--------------------+----------+--------+ Location               PSV (cm/s)Intralum. Diam. (cm)Waveform  Comments +-----------------------+----------+--------------------+----------+--------+ Brachial Antecub. fossa41        0.50                triphasic          +-----------------------+----------+--------------------+----------+--------+ Radial Art at Wrist    35        0.17                biphasic           +-----------------------+----------+--------------------+----------+--------+ Ulnar Art at Wrist     15        0.17                monophasicbrisk     +-----------------------+----------+--------------------+----------+--------+  Summary:   Measurements above.  NOTE: This was a technically difficult exam. *See table(s) above for measurements and observations. Electronically signed by Orlie Pollen on 08/18/2021 at 5:32:56 PM.    Final     Microbiology: Results for orders placed or performed during the hospital encounter of 09/13/21  SARS Coronavirus 2 by RT PCR (hospital order, performed in [facility] hospital lab) *cepheid single result test* Anterior Nasal Swab     Status: None   Collection Time: 09/13/21 11:23 PM   Specimen: Anterior Nasal Swab  Result Value Ref Range Status   SARS Coronavirus 2 by RT PCR NEGATIVE NEGATIVE Final    Comment: (NOTE) SARS-CoV-2 target nucleic acids are NOT DETECTED.  The SARS-CoV-2 RNA is generally detectable in upper and lower respiratory specimens during the acute phase of infection. The lowest concentration of SARS-CoV-2 viral copies this assay can detect is 250 copies / mL. A negative result does not preclude SARS-CoV-2 infection and should not be used as the sole basis for treatment or other patient management decisions.  A negative result may occur with improper specimen collection / handling, submission of specimen other than nasopharyngeal swab, presence of viral mutation(s) within the areas targeted by this assay, and inadequate number of viral copies (<250 copies / mL). A negative result must be combined with clinical observations, patient history, and epidemiological information.  Fact Sheet for Patients:   https://www.patel.info/  Fact Sheet for Healthcare Providers: https://hall.com/  This test is not yet approved or  cleared by the [place] FDA and has been authorized for detection and/or diagnosis of SARS-CoV-2 by FDA under an Emergency Use Authorization (EUA).  This EUA will remain in effect (meaning this test can be used) for the  duration of the COVID-19 declaration under Section 564(b)(1) of the Act, 21 U.S.C. section 360bbb-3(b)(1), unless the authorization is terminated or revoked sooner.  Performed at [facility], [place] [address], [place], [place] [address]   MRSA Next Gen by PCR, Nasal     Status: None   Collection Time: 09/14/21  9:35 PM   Specimen: Nasal Mucosa; Nasal Swab  Result Value Ref Range Status   MRSA by PCR Next Gen NOT DETECTED NOT DETECTED Final    Comment: (NOTE) The GeneXpert MRSA Assay (FDA approved for NASAL specimens only), is one component of a comprehensive MRSA colonization surveillance program. It is not intended to diagnose MRSA infection nor to guide or monitor treatment for MRSA infections. Test performance is not FDA approved in patients less than 68 years old. Performed at [facility], [place] [address], [place],  [address]     Labs: CBC: Recent Labs  Lab 09/13/21 1710 09/14/21 0250 09/14/21 0302 09/16/21 1602  WBC 3.4* 3.3*  --  3.8*  NEUTROABS  --  1.7  --   --   HGB 11.7* 10.0* 10.5* 11.0*  HCT 37.9* 33.2* 31.0* 36.1*  MCV 92.2 94.3  --  93.8  PLT 85* 71*  --  80*   Basic Metabolic Panel: Recent Labs  Lab 09/13/21 1710 09/14/21 0250 09/14/21 0302 09/15/21 0542 09/16/21 0625 09/17/21 0301  NA 141 139 141 136 136 136  K 4.0 3.4* 3.5 4.0 4.0 3.8  CL 99 104  --  95* 98 98  CO2 26 21*  --  '26 27 27  '$ GLUCOSE 99 110*  --  92 106* 107*  BUN 45* 46*  --  44* 34* 22*  CREATININE 8.77* 8.21*  --  7.19* 5.73* 4.15*  CALCIUM 9.7 8.0*  --  9.7 9.5 9.9  MG  --  1.9  --   --   --   --   PHOS  --  6.6*  --  5.6*  --   --    Liver Function Tests: Recent Labs  Lab 09/13/21 1710 09/14/21 0250 09/15/21 0542  AST 18 18  --   ALT 11 9  --   ALKPHOS 66 56  --   BILITOT 1.0 0.5  --   PROT 8.0 6.3*  --   ALBUMIN 3.3* 2.5* 3.1*   CBG: No results for input(s): "GLUCAP" in the last 168 hours.  Discharge time spent: greater than 30  minutes.  Signed: Tawni Millers, MD [facility] 09/17/2021

## 2021-09-17 NOTE — Progress Notes (Signed)
[facility] Progress Note  Subjective: pt seen in room. SOB better, legs not as swollen.   Vitals:   09/16/21 1854 09/16/21 2137 09/17/21 0532 09/17/21 0939  BP:  107/83 (!) 87/73 (!) 98/53  Pulse:  99 79 80  Resp:  '18 17 19  '$ Temp: 98.1 F (36.7 C) 98.4 F (36.9 C) 98.2 F (36.8 C) 98.3 F (36.8 C)  TempSrc:   Oral   SpO2:  100% 99% 94%  Weight:   73.2 kg   Height:        Exam: Gen alert, no distress No jvd or bruits Chest clear bilat RRR no MRG Abd soft ntnd no mass or ascites +bs Ext 1+ bilat pretib edema, improving Neuro is alert, Ox 3 , nf    RIJ TDC in place    Home meds include - albuterol, apixaban, colace, advair diskus, midodrine 5 tid, montelukast, pantoprazole, trazodone, velphoro 1 gm ac tid, prns/ vits/ supps   CXR 9/06 - IMPRESSION: 1. Cardiomegaly with mild pulmonary edema. 2. 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     OP HD: SW TTS 4h  450/500   73.5kg  2/2 bath  P2   TDC   Hep none - hep B labs done 8/30 - last HD 9/5, post 74kg, ave UF 2-3kg - etelcalcetide 7.5 mg iv tiw - last Hb 11.5, not on esa or Fe    Assessment/ Plan: SOB /vol overload - admit CXR w/ IS edema and RLL scarring, admit exam w/ +sig LE edema + recent CTA of chest 8/28 showed extensive GG changes c/w edema. While here he got HD x 3 and is back down to his dry wt , or a bit under today at 73.2kg.  He is down 5-6kg from admission. He did have some significantly low BP's here which made volume removal somewhat difficult. We ^'d his midodrine. He is today back to baseline, down 5-6kg from admission. He is on home O2. For DC today per pmd.  ESRD - on HD TTS. As above. Next HD as outpt on 9/12.  BP - BPs are wnl, follow w/ vol removal. Not on any BP lowering meds at home, we ^'d his home midodrine from 5 mg to 10 mg  tid while here.  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 Follow, cont binder  Rob Shiro Ellerman 09/17/2021, 3:27 PM   Recent Labs  Lab 09/14/21 0250 09/14/21 0302 09/15/21 0542 09/16/21 0625 09/16/21 1602 09/17/21 0301  HGB 10.0* 10.5*  --   --  11.0*  --   ALBUMIN 2.5*  --  3.1*  --   --   --   CALCIUM 8.0*  --  9.7 9.5  --  9.9  PHOS 6.6*  --  5.6*  --   --   --   CREATININE 8.21*  --  7.19* 5.73*  --  4.15*  K 3.4* 3.5 4.0 4.0  --  3.8    No results for input(s): "IRON", "TIBC", "FERRITIN" in the last 168 hours. Inpatient medications:  apixaban  5 mg Oral BID   calcium acetate  1,334 mg Oral TID WC   Chlorhexidine Gluconate Cloth  6 each Topical Q0600   Chlorhexidine Gluconate Cloth  6 each Topical Q0600   fluticasone furoate-vilanterol  1 puff Inhalation Daily   midodrine  10 mg Oral TID WC   montelukast  10 mg Oral Daily   sucroferric oxyhydroxide  1,000 mg Oral BID PC    albumin human 25 g (09/16/21 1517)   acetaminophen **OR** acetaminophen, albumin human, albuterol

## 2021-09-17 NOTE — Progress Notes (Signed)
Pharmacist Heart Failure Core Measure Documentation  Assessment: Nathaniel White has an EF documented as 30-35% on 09/05/21 by ECHO.  Rationale: Heart failure patients with left ventricular systolic dysfunction (LVSD) and an EF < 40% should be prescribed an angiotensin converting enzyme inhibitor (ACEI) or angiotensin receptor blocker (ARB) at discharge unless a contraindication is documented in the medical record.  This patient is not currently on an ACEI or ARB for HF.  This note is being placed in the record in order to provide documentation that a contraindication to the use of these agents is present for this encounter.  ACE Inhibitor or Angiotensin Receptor Blocker is contraindicated (specify all that apply)  '[]'$   ACEI allergy AND ARB allergy '[]'$   Angioedema '[]'$   Moderate or severe aortic stenosis '[]'$   Hyperkalemia '[x]'$   Hypotension '[]'$   Renal artery stenosis '[x]'$   Worsening renal function, preexisting renal disease or dysfunction   Dimple Nanas, PharmD, BCPS 09/17/2021 2:47 PM

## 2021-09-18 ENCOUNTER — Telehealth: Payer: Self-pay

## 2021-09-18 ENCOUNTER — Encounter (HOSPITAL_COMMUNITY): Admission: RE | Payer: Self-pay | Source: Home / Self Care

## 2021-09-18 ENCOUNTER — Ambulatory Visit (HOSPITAL_COMMUNITY): Admission: RE | Admit: 2021-09-18 | Payer: Medicare Other | Source: Home / Self Care | Admitting: Vascular Surgery

## 2021-09-18 SURGERY — ARTERIOVENOUS (AV) FISTULA CREATION
Anesthesia: Monitor Anesthesia Care | Laterality: Right

## 2021-09-18 NOTE — Telephone Encounter (Signed)
Transition Care Management Follow-up Telephone Call Date of discharge and from where: 09/17/2021, [facility]  How have you been since you were released from the hospital? He stated he is feeling fine.  Any questions or concerns? No  Items Reviewed: Did the pt receive and understand the discharge instructions provided? Yes  Medications obtained and verified? Yes - he said he has all of the medications but needs to review his list just to make sure. He also has a nebulizer.  Other? No  Any new allergies since your discharge? No  Dietary orders reviewed? No Do you have support at home? Yes   Home Care and Equipment/Supplies: Were home health services ordered? yes If so, what is the name of the agency? [facility].   Has the agency set up a time to come to the patient's home? He had been receiving services prior to his hospitalization.  Were any new equipment or medical supplies ordered?  No What is the name of the medical supply agency? N/a Were you able to get the supplies/equipment? not applicable Do you have any questions related to the use of the equipment or supplies? No  Functional Questionnaire: (I = Independent and D = Dependent) ADLs: ambulates with a rollator.  Independent with personal care.  Using O2 @ 3L continuously.  Attends HD: T/T/S at [facility]  Follow up appointments reviewed:  PCP Hospital f/u appt confirmed? Yes  Scheduled to see Geryl Rankins, NP -09/26/2021,  He said he will be there even though it is a dialysis day. He's done with HD at 1100. [facility] f/u appt confirmed?  None scheduled at this time   Are transportation arrangements needed? No - he has a car and drives.  If their condition worsens, is the pt aware to call PCP or go to the Emergency Dept.? Yes Was the patient provided with contact information for the PCP's office or ED? Yes Was to pt encouraged to call back with questions or concerns? Yes

## 2021-09-18 NOTE — Progress Notes (Signed)
Late Entry Note:  Pt was d/c yesterday. Contacted [facility] this am to advise clinic of pt's d/c and that pt will resume care tomorrow.   Melven Sartorius Renal Navigator [phone]

## 2021-09-19 ENCOUNTER — Other Ambulatory Visit: Payer: Self-pay | Admitting: Nurse Practitioner

## 2021-09-20 ENCOUNTER — Telehealth: Payer: Self-pay | Admitting: Emergency Medicine

## 2021-09-20 NOTE — Telephone Encounter (Signed)
Copied from [place] [id number]. Topic: Quick Communication - Home Health Verbal Orders >> Sep 20, 2021  1:55 PM Everette C wrote: Caller/Agency: [facility] Number: [phone] Requesting OT/PT/Skilled Nursing/Social Work/Speech Therapy: Skilled Nursing  Frequency: 1w5

## 2021-09-21 NOTE — Telephone Encounter (Signed)
Return call unanswered. Voicemail left.

## 2021-09-22 NOTE — Telephone Encounter (Signed)
Yes verbal orders. Thanks

## 2021-09-25 ENCOUNTER — Telehealth: Payer: Self-pay | Admitting: Nurse Practitioner

## 2021-09-25 ENCOUNTER — Encounter: Payer: Self-pay | Admitting: Family Medicine

## 2021-09-25 NOTE — Telephone Encounter (Signed)
VO given to Shanon Brow, PT with [facility]

## 2021-09-25 NOTE — Telephone Encounter (Signed)
Home Health Verbal Orders - Caller/Agency: Shanon Brow from [facility] Number: [phone] Requesting /PT for balance, strength, gait mobility Frequency: 1x5

## 2021-09-26 ENCOUNTER — Inpatient Hospital Stay: Payer: Self-pay | Admitting: Nurse Practitioner

## 2021-10-19 ENCOUNTER — Encounter: Payer: Self-pay | Admitting: Physician Assistant

## 2021-11-13 ENCOUNTER — Ambulatory Visit: Payer: Medicare Other | Admitting: Nurse Practitioner

## 2021-12-01 ENCOUNTER — Ambulatory Visit: Payer: Self-pay

## 2021-12-01 ENCOUNTER — Telehealth: Payer: Self-pay | Admitting: Nurse Practitioner

## 2021-12-01 NOTE — Telephone Encounter (Signed)
  Chief Complaint: out of Eliquis Symptoms: none Frequency: today Pertinent Negatives: Patient denies  Disposition: '[]'$ ED /'[]'$ Urgent Care (no appt availability in office) / '[]'$ Appointment(In office/virtual)/ '[]'$  [facility]/ '[]'$ Home Care/ '[]'$ Refused Recommended Disposition /'[]'$ [facility]/ '[x]'$  Follow-up with PCP Additional Notes: Pt is out of Eliquis. Rx written by another provider. Pt will self monitor for s/s of blood clots.    Summary: discuss medication   Pt requesting a cb to discuss how long he can go w/o taking APIXABAN (ELIQUIS)  Please advise     Reason for Disposition  [1] Prescription refill request for ESSENTIAL medicine (i.e., likelihood of harm to patient if not taken) AND [2] triager unable to refill per department policy  Answer Assessment - Initial Assessment Questions 1. NAME of MEDICINE: "What medicine(s) are you calling about?"     Eliquis 2. QUESTION: "What is your question or concern?" (e.g., side effect, took wrong dose, how to take, need more medicine)     Out of medication 3. PRESCRIBER: "Who prescribed the medicine?" The triager should check to see if the medicine is on the patient's hospice medication list.      4. REFILL NEEDED: If additional supply or refill of medicine is needed, ask: "How much medicine do you have left right now?" (e.g., how many pills, how many days' supply)     none 5. SYMPTOMS: "Do you (your loved one; patient) have any symptoms?" If Yes, ask: "What symptoms are you having?" "How bad are the symptoms?" (e.g., mild, moderate, severe)      6. CALLER's COPING: "How are you doing?" "How are other family members and loved ones doing?"  Answer Assessment - Initial Assessment Questions 1. DRUG NAME: "What medicine do you need to have refilled?"     Eliquis 2. REFILLS REMAINING: "How many refills are remaining?" (Note: The label on the medicine or pill bottle will show how many refills are remaining. If there are no refills  remaining, then a renewal may be needed.)     0 3. EXPIRATION DATE: "What is the expiration date?" (Note: The label states when the prescription will expire, and thus can no longer be refilled.)      4. PRESCRIBING HCP: "Who prescribed it?" Reason: If prescribed by specialist, call should be referred to that group.     0 5. SYMPTOMS: "Do you have any symptoms?"     0 6. PREGNANCY: "Is there any chance that you are pregnant?" "When was your last menstrual period?"     0  Protocols used: Hospice - Medication Question or Refill Call-A-AH, Medication Refill and Renewal Call-A-AH

## 2021-12-01 NOTE — Telephone Encounter (Signed)
Medication Refill - Medication:  APIXABAN (ELIQUIS)  Has the patient contacted their pharmacy? Yes.    (Agent: If yes, when and what did the pharmacy advise?) contact provider  Preferred Pharmacy (with phone number or street name):  [facility]   Has the patient been seen for an appointment in the last year OR does the patient have an upcoming appointment? Yes.    Agent: Please be advised that RX refills may take up to 3 business days. We ask that you follow-up with your pharmacy.

## 2021-12-04 NOTE — Telephone Encounter (Signed)
Requested medication (s) are due for refill today: routing for review  Requested medication (s) are on the active medication list: yes  Last refill:  09/08/21  Future visit scheduled: no  Notes to clinic:  Unable to refill per protocol, last refill by another provider.      Requested Prescriptions  Pending Prescriptions Disp Refills   APIXABAN (ELIQUIS) VTE STARTER PACK ('10MG'$  AND '5MG'$ ) 74 tablet 0    Sig: Take as directed on package: start with two-'5mg'$  tablets twice daily for 7 days. On day 8, switch to one-'5mg'$  tablet twice daily.     Hematology:  Anticoagulants - apixaban Failed - 12/01/2021  1:05 PM      Failed - PLT in normal range and within 360 days    Platelets  Date Value Ref Range Status  09/16/2021 80 (L) 150 - 400 K/uL Final    Comment:    Immature Platelet Fraction may be clinically indicated, consider ordering this additional test [id number] REPEATED TO VERIFY   06/07/2021 67 (LL) 150 - 450 x10E3/uL Final         Failed - HGB in normal range and within 360 days    Hemoglobin  Date Value Ref Range Status  09/16/2021 11.0 (L) 13.0 - 17.0 g/dL Final  06/07/2021 14.3 13.0 - 17.7 g/dL Final   HGB  Date Value Ref Range Status  01/17/2015 11.5 (L) 13.0 - 17.1 g/dL Final         Failed - HCT in normal range and within 360 days    HCT  Date Value Ref Range Status  09/16/2021 36.1 (L) 39.0 - 52.0 % Final  01/17/2015 35.7 (L) 38.4 - 49.9 % Final   Hematocrit  Date Value Ref Range Status  06/07/2021 43.6 37.5 - 51.0 % Final         Failed - Cr in normal range and within 360 days    Creatinine  Date Value Ref Range Status  01/17/2015 9.9 (HH) 0.7 - 1.3 mg/dL Final   Creatinine, Ser  Date Value Ref Range Status  09/17/2021 4.15 (H) 0.61 - 1.24 mg/dL Final         Passed - AST in normal range and within 360 days    AST  Date Value Ref Range Status  09/14/2021 18 15 - 41 U/L Final  01/17/2015 17 5 - 34 U/L Final         Passed - ALT in normal range and  within 360 days    ALT  Date Value Ref Range Status  09/14/2021 9 0 - 44 U/L Final  01/17/2015 9 0 - 55 U/L Final         Passed - Valid encounter within last 12 months    Recent Outpatient Visits           3 months ago History of CVA (cerebrovascular accident)   [place], Vernia Buff, NP   6 months ago Primary hypertension   [facility], Vernia Buff, NP   10 months ago Left hand pain   [place], [place]   10 months ago Left hand pain   [facility], MD   11 months ago Left hand pain   [facility], Kriste Basque, NP

## 2021-12-04 NOTE — Telephone Encounter (Signed)
Requested medication (s) are due for refill today: routing for review  Requested medication (s) are on the active medication list:yes  Last refill:  09/08/21  Future visit scheduled: no  Notes to clinic:  Unable to refill per protocol, last refill by another provider.      Requested Prescriptions  Pending Prescriptions Disp Refills   APIXABAN (ELIQUIS) VTE STARTER PACK ('10MG'$  AND '5MG'$ ) 74 tablet 0    Sig: Take as directed on package: start with two-'5mg'$  tablets twice daily for 7 days. On day 8, switch to one-'5mg'$  tablet twice daily.     Hematology:  Anticoagulants - apixaban Failed - 12/01/2021  1:05 PM      Failed - PLT in normal range and within 360 days    Platelets  Date Value Ref Range Status  09/16/2021 80 (L) 150 - 400 K/uL Final    Comment:    Immature Platelet Fraction may be clinically indicated, consider ordering this additional test [id number] REPEATED TO VERIFY   06/07/2021 67 (LL) 150 - 450 x10E3/uL Final         Failed - HGB in normal range and within 360 days    Hemoglobin  Date Value Ref Range Status  09/16/2021 11.0 (L) 13.0 - 17.0 g/dL Final  06/07/2021 14.3 13.0 - 17.7 g/dL Final   HGB  Date Value Ref Range Status  01/17/2015 11.5 (L) 13.0 - 17.1 g/dL Final         Failed - HCT in normal range and within 360 days    HCT  Date Value Ref Range Status  09/16/2021 36.1 (L) 39.0 - 52.0 % Final  01/17/2015 35.7 (L) 38.4 - 49.9 % Final   Hematocrit  Date Value Ref Range Status  06/07/2021 43.6 37.5 - 51.0 % Final         Failed - Cr in normal range and within 360 days    Creatinine  Date Value Ref Range Status  01/17/2015 9.9 (HH) 0.7 - 1.3 mg/dL Final   Creatinine, Ser  Date Value Ref Range Status  09/17/2021 4.15 (H) 0.61 - 1.24 mg/dL Final         Passed - AST in normal range and within 360 days    AST  Date Value Ref Range Status  09/14/2021 18 15 - 41 U/L Final  01/17/2015 17 5 - 34 U/L Final         Passed - ALT in normal range and  within 360 days    ALT  Date Value Ref Range Status  09/14/2021 9 0 - 44 U/L Final  01/17/2015 9 0 - 55 U/L Final         Passed - Valid encounter within last 12 months    Recent Outpatient Visits           3 months ago History of CVA (cerebrovascular accident)   [place], Vernia Buff, NP   6 months ago Primary hypertension   [place], Vernia Buff, NP   10 months ago Left hand pain   [place], [place]   10 months ago Left hand pain   [facility] at [facility], MD   11 months ago Left hand pain   Primary Care at [facility], Kriste Basque, NP

## 2021-12-06 NOTE — Telephone Encounter (Signed)
Pt called in to follow up on request. Pt says that he has been without this medication for a few days. Pt says the last provider that prescribed medication was one that he seen at the hospital.    Pharmacy:  [facility] [place], [place] - [address] AT [facility] Phone: [phone]  Fax: [phone]

## 2021-12-07 ENCOUNTER — Other Ambulatory Visit: Payer: Self-pay | Admitting: Nurse Practitioner

## 2021-12-07 MED ORDER — APIXABAN 2.5 MG PO TABS: 2.5000 mg | ORAL_TABLET | Freq: Two times a day (BID) | ORAL | 0 refills | Status: DC

## 2021-12-07 NOTE — Telephone Encounter (Signed)
Only 30 day supply sent. No additional refills from me. He has to see cardiology for additional refills. He can call below to schedule Ordering and Authorizing Provider: Burnell Blanks, MD [place], [place] [id number] Phone: [phone]

## 2021-12-08 NOTE — Telephone Encounter (Signed)
Patient identified by name and date of birth. Aware of Fleming's response and given cardiology's information for future refills.

## 2021-12-25 ENCOUNTER — Other Ambulatory Visit: Payer: Self-pay

## 2022-02-09 ENCOUNTER — Other Ambulatory Visit: Payer: Self-pay

## 2022-02-09 ENCOUNTER — Encounter: Payer: Self-pay | Admitting: Nurse Practitioner

## 2022-02-09 ENCOUNTER — Ambulatory Visit: Payer: Medicare Other | Attending: Nurse Practitioner | Admitting: Nurse Practitioner

## 2022-02-09 ENCOUNTER — Telehealth: Payer: Self-pay | Admitting: Cardiovascular Disease

## 2022-02-09 VITALS — BP 102/52 | HR 78 | Ht 69.0 in | Wt 164.8 lb

## 2022-02-09 DIAGNOSIS — I480 Paroxysmal atrial fibrillation: Secondary | ICD-10-CM | POA: Diagnosis not present

## 2022-02-09 DIAGNOSIS — Z23 Encounter for immunization: Secondary | ICD-10-CM | POA: Diagnosis not present

## 2022-02-09 DIAGNOSIS — Z992 Dependence on renal dialysis: Secondary | ICD-10-CM | POA: Insufficient documentation

## 2022-02-09 DIAGNOSIS — Z1211 Encounter for screening for malignant neoplasm of colon: Secondary | ICD-10-CM | POA: Diagnosis not present

## 2022-02-09 DIAGNOSIS — Z7901 Long term (current) use of anticoagulants: Secondary | ICD-10-CM | POA: Diagnosis not present

## 2022-02-09 DIAGNOSIS — N186 End stage renal disease: Secondary | ICD-10-CM | POA: Diagnosis not present

## 2022-02-09 DIAGNOSIS — I5040 Unspecified combined systolic (congestive) and diastolic (congestive) heart failure: Secondary | ICD-10-CM | POA: Diagnosis present

## 2022-02-09 DIAGNOSIS — I110 Hypertensive heart disease with heart failure: Secondary | ICD-10-CM

## 2022-02-09 DIAGNOSIS — Z0100 Encounter for examination of eyes and vision without abnormal findings: Secondary | ICD-10-CM | POA: Diagnosis not present

## 2022-02-09 DIAGNOSIS — I10 Essential (primary) hypertension: Secondary | ICD-10-CM | POA: Diagnosis not present

## 2022-02-09 DIAGNOSIS — I132 Hypertensive heart and chronic kidney disease with heart failure and with stage 5 chronic kidney disease, or end stage renal disease: Secondary | ICD-10-CM | POA: Insufficient documentation

## 2022-02-09 DIAGNOSIS — E1122 Type 2 diabetes mellitus with diabetic chronic kidney disease: Secondary | ICD-10-CM | POA: Diagnosis not present

## 2022-02-09 MED ORDER — APIXABAN 2.5 MG PO TABS: 2.5000 mg | ORAL_TABLET | Freq: Two times a day (BID) | ORAL | 0 refills | Status: DC

## 2022-02-09 MED ORDER — ZOSTER VAC RECOMB ADJUVANTED 50 MCG/0.5ML IM SUSR: 0.5000 mL | Freq: Once | INTRAMUSCULAR | 0 refills | Status: AC

## 2022-02-09 NOTE — Telephone Encounter (Signed)
Patient c/o Palpitations:  High priority if patient c/o lightheadedness, shortness of breath, or chest pain  How long have you had palpitations/irregular HR/ Afib? Are you having the symptoms now? Yes  Are you currently experiencing lightheadedness, SOB or CP? No  Do you have a history of afib (atrial fibrillation) or irregular heart rhythm? Yes  Have you checked your BP or HR? (document readings if available):   Are you experiencing any other symptoms? Low energy. Nurse states pt is Asymptomatic

## 2022-02-09 NOTE — Progress Notes (Signed)
Assessment & Plan:  Khaliq was seen today for hypertension.  Diagnoses and all orders for this visit:  Primary hypertension Well controlled He is currently not taking any antihypertensives.  Blood pressures remain low normal.  Need for shingles vaccine -     Zoster Vaccine Adjuvanted [facility]) injection; Inject 0.5 mLs into the muscle once for 1 dose.  Eye exam, routine -     Ambulatory referral to Ophthalmology  Colon cancer screening -     Cologuard  CHF, systolic dysfunction -     Ambulatory referral to Cardiology He has not seen cardiology in almost a year.  There was discussion of cardioversion however patient has been lost to follow-up since then due to a few hospital admissions.  Paroxysmal atrial fibrillation ([place]) -     Ambulatory referral to Cardiology Eliquis refill for courtesy of 1 month.    Patient has been counseled on age-appropriate routine health concerns for screening and prevention. These are reviewed and up-to-date. Referrals have been placed accordingly. Immunizations are up-to-date or declined.    Subjective:   Chief Complaint  Patient presents with   Hypertension   Hypertension Associated symptoms include malaise/fatigue. Pertinent negatives include no blurred vision, chest pain, headaches, palpitations or shortness of breath.   Nathaniel White 62 y.o. male presents to office today for follow-up to hypertension.  He is accompanied by his significant other.  He endorses feeling fatigued lately but denies shortness of breath or chest pain.  He continues in A-fib which is rate controlled and endorses adherence taking Eliquis.   Blood pressure is low normal.  He takes midodrine on hemodialysis days which are Tuesday Thursday Saturday. BP Readings from Last 3 Encounters:  02/09/22 (!) 102/52  09/17/21 (!) 98/53  09/08/21 90/62    Diabetes is well-controlled. Lab Results  Component Value Date   HGBA1C 5.2 09/05/2021    Review of Systems   Constitutional:  Positive for malaise/fatigue. Negative for fever and weight loss.  HENT: Negative.  Negative for nosebleeds.   Eyes: Negative.  Negative for blurred vision, double vision and photophobia.  Respiratory: Negative.  Negative for cough and shortness of breath.   Cardiovascular: Negative.  Negative for chest pain, palpitations and leg swelling.  Gastrointestinal: Negative.  Negative for heartburn, nausea and vomiting.  Musculoskeletal: Negative.  Negative for myalgias.  Neurological: Negative.  Negative for dizziness, focal weakness, seizures and headaches.  Psychiatric/Behavioral: Negative.  Negative for suicidal ideas.     Past Medical History:  Diagnosis Date   Anemia    Anxiety    Asthma    CHF (congestive heart failure) ([place])    Complication from renal dialysis device 24/40/1027   Complication of anesthesia 2017   according to pt and spouse pt was moving around and cough while under and pt had difficulty waking up so the anesthesia had to be reversed. and pt admitted to ICU.    COPD (chronic obstructive pulmonary disease) (HCC)    Diabetes mellitus without complication ([place])    Type II - Patient states he does not have diabetes, "they said sometimes when you start dialysis you don't have diabetes any longer"    ESOPHAGEAL VARICES 10/04/2008   Qualifier: Diagnosis of  By: Fuller Plan MD Marijo Conception    ESRD    on HD, T-TH-Sat - [place]   Hemiparesis due to old stroke [facility])    left   Hypertension    Hx, not current problems, no meds   LV dysfunction  EF 25-30% by echo 07/2011   Memory loss due to medical condition    due to stroke   MR (mitral regurgitation)    moderate to severe, echo 07/2011   Nausea with vomiting, unspecified 06/15/2021   Paroxysmal atrial fibrillation (HCC)    Peripheral vascular disease (HCC)    Shortness of breath    with exertion   Stroke ([place])    TIA's-left sided weakness   Tobacco abuse     Past Surgical History:  Procedure  Laterality Date   ABDOMINAL AORTOGRAM W/LOWER EXTREMITY Bilateral 07/21/2018   Procedure: ABDOMINAL AORTOGRAM W/LOWER EXTREMITY;  Surgeon: Waynetta Sandy, MD;  Location: [place] CV LAB;  Service: Cardiovascular;  Laterality: Bilateral;   AMPUTATION Left 11/05/2018   Procedure: AMPUTATION SECOND TOE LEFT FOOT;  Surgeon: Waynetta Sandy, MD;  Location: [place];  Service: Vascular;  Laterality: Left;   AMPUTATION Left 02/13/2021   Procedure: Left small finger AMPUTATION DIGIT;  Surgeon: Sherilyn Cooter, MD;  Location: [place];  Service: Orthopedics;  Laterality: Left;   AV FISTULA PLACEMENT     CARDIAC CATHETERIZATION     [place] [facility]   COLONOSCOPY W/ BIOPSIES AND POLYPECTOMY     FISTULA SUPERFICIALIZATION Left 11/10/2013   Procedure: FISTULA PLICATION;  Surgeon: Serafina Mitchell, MD;  Location: [place];  Service: Vascular;  Laterality: Left;   INSERTION OF DIALYSIS CATHETER N/A 06/20/2021   Procedure: INSERTION OF DIALYSIS CATHETER;  Surgeon: Broadus John, MD;  [facility];  Service: Vascular;  Laterality: N/A;   KIDNEY TRANSPLANT     2011 rejected kidney 2012 back on dialysis   LEFT AND RIGHT HEART CATHETERIZATION WITH CORONARY ANGIOGRAM N/A 10/09/2013   Procedure: LEFT AND RIGHT HEART CATHETERIZATION WITH CORONARY ANGIOGRAM;  Surgeon: Burnell Blanks, MD;  Location: [facility] CATH LAB;  Service: Cardiovascular;  Laterality: N/A;   LIGATION OF ARTERIOVENOUS  FISTULA Left 06/20/2021   Procedure: LIGATION OF ARTERIOVENOUS  FISTULA;  Surgeon: Broadus John, MD;  Location: [place];  Service: Vascular;  Laterality: Left;   PERIPHERAL VASCULAR INTERVENTION Left 07/21/2018   Procedure: PERIPHERAL VASCULAR INTERVENTION;  Surgeon: Waynetta Sandy, MD;  Location: [place] CV LAB;  Service: Cardiovascular;  Laterality: Left;  Popliteal   REVISON OF ARTERIOVENOUS FISTULA Left 08/26/2013   Procedure: EXCISION OF ERODED SKIN AND EXPLORATION OF MAIN LEFT UPPER ARM AV  FISTULA;  Surgeon: Rosetta Posner, MD;  Location: [place];  Service: Vascular;  Laterality: Left;   REVISON OF ARTERIOVENOUS FISTULA Left 02/05/2014   Procedure: REPAIR OF ARTERIOVENOUS FISTULA ANEURYSM;  Surgeon: Serafina Mitchell, MD;  Location: [facility] OR;  Service: Vascular;  Laterality: Left;   REVISON OF ARTERIOVENOUS FISTULA Left 03/10/2021   Procedure: LEFT ARM REVISION OF ARTERIOVENOUS FISTULA WITH PLICATION;  Surgeon: Waynetta Sandy, MD;  Location: [place];  Service: Vascular;  Laterality: Left;   SHUNTOGRAM N/A 11/05/2012   Procedure: Fistulogram;  Surgeon: Serafina Mitchell, MD;  Location: [facility] C[facility] LAB;  Service: Cardiovascular;  Laterality: N/A;   TEE WITHOUT CARDIOVERSION  08/17/2011   Procedure: TRANSESOPHAGEAL ECHOCARDIOGRAM (TEE);  Surgeon: Larey Dresser, MD;  Location: [place];  Service: Cardiovascular;  Laterality: N/A;   ULTRASOUND GUIDANCE FOR VASCULAR ACCESS  06/20/2021   Procedure: ULTRASOUND GUIDANCE FOR VASCULAR ACCESS;  Surgeon: Broadus John, MD;  Location: [facility] OR;  Service: Vascular;;   VIDEO ASSISTED THORACOSCOPY (VATS)/DECORTICATION  08/10/11    Family History  Problem Relation Age of Onset   Hypertension Mother  Varicose Veins Mother    Diabetes Paternal Grandmother    Cancer Paternal Grandfather    CAD Paternal Uncle     Social History Reviewed with no changes to be made today.   Outpatient Medications Prior to Visit  Medication Sig Dispense Refill   acetaminophen (TYLENOL) 500 MG tablet Take 1 tablet (500 mg total) by mouth every 6 (six) hours as needed. (Patient taking differently: Take 500-1,000 mg by mouth 2 (two) times daily as needed for headache.) 30 tablet 0   apixaban (ELIQUIS) 2.5 MG TABS tablet Take 1 tablet (2.5 mg total) by mouth 2 (two) times daily. CONTACT CARDIOLOGY FOR REFILLS 60 tablet 0   APIXABAN (ELIQUIS) VTE STARTER PACK ('10MG'$  AND '5MG'$ ) Take as directed on package: start with two-'5mg'$  tablets twice daily for 7 days. On day 8, switch to  one-'5mg'$  tablet twice daily. 74 tablet 0   hydrOXYzine (ATARAX) 10 MG tablet Take 1 tablet (10 mg total) by mouth 3 (three) times daily as needed. For anxiety (Patient taking differently: Take 10 mg by mouth 2 (two) times daily as needed for anxiety.) 60 tablet 0   VELPHORO 500 MG chewable tablet Chew 1,000 mg by mouth 2 (two) times daily after a meal.     VISINE 0.05 % ophthalmic solution Place 2-3 drops into both eyes 2 (two) times daily as needed (dry eye).     albuterol (PROAIR HFA) 108 (90 Base) MCG/ACT inhaler Inhale 2 puffs into the lungs every 6 (six) hours as needed for wheezing or shortness of breath. (Patient not taking: Reported on 02/09/2022) 18 g 2   albuterol (PROVENTIL) (2.5 MG/3ML) 0.083% nebulizer solution Take 3 mLs (2.5 mg total) by nebulization every 6 (six) hours as needed for wheezing or shortness of breath. (Patient not taking: Reported on 02/09/2022) 150 mL 1   Ferrous Sulfate (IRON PO) Take 1 tablet by mouth in the morning, at noon, and at bedtime. (Patient not taking: Reported on 02/09/2022)     fluticasone-salmeterol (ADVAIR DISKUS) 250-50 MCG/ACT AEPB Inhale 1 puff into the lungs in the morning and at bedtime. NEEDS PASS (Patient not taking: Reported on 02/09/2022) 180 each 6   montelukast (SINGULAIR) 10 MG tablet Take 1 tablet (10 mg total) by mouth at bedtime. (Patient not taking: Reported on 02/09/2022) 90 tablet 3   traZODone (DESYREL) 50 MG tablet Take 1 tablet (50 mg total) by mouth at bedtime as needed for sleep. (Patient not taking: Reported on 02/09/2022) 30 tablet 0   No facility-administered medications prior to visit.    Allergies  Allergen Reactions   Codeine Shortness Of Breath   Dextromethorphan-Guaifenesin Shortness Of Breath   Iron Dextran Shortness Of Breath   Morphine And Related Shortness Of Breath       Objective:    BP (!) 102/52   Pulse 78   Ht '5\' 9"'$  (1.753 m)   Wt 164 lb 12.8 oz (74.8 kg)   SpO2 92%   BMI 24.34 kg/m  Wt Readings from Last 3  Encounters:  02/09/22 164 lb 12.8 oz (74.8 kg)  09/17/21 161 lb 6 oz (73.2 kg)  09/08/21 160 lb 14.4 oz (73 kg)    Physical Exam Vitals and nursing note reviewed.  Constitutional:      Appearance: He is well-developed.  HENT:     Head: Normocephalic and atraumatic.  Cardiovascular:     Rate and Rhythm: Normal rate. Rhythm irregular.     Heart sounds: Normal heart sounds. No murmur heard.  No friction rub. No gallop.  Pulmonary:     Effort: Pulmonary effort is normal. No tachypnea or respiratory distress.     Breath sounds: Normal breath sounds. No decreased breath sounds, wheezing, rhonchi or rales.  Chest:     Chest wall: No tenderness.  Abdominal:     General: Bowel sounds are normal.     Palpations: Abdomen is soft.  Musculoskeletal:        General: Normal range of motion.     Cervical back: Normal range of motion.  Skin:    General: Skin is warm and dry.  Neurological:     Mental Status: He is alert and oriented to person, place, and time.     Cranial Nerves: Cranial nerve deficit (left-sided weakness) present.     Coordination: Coordination normal.  Psychiatric:        Behavior: Behavior normal. Behavior is cooperative.        Thought Content: Thought content normal.        Judgment: Judgment normal.          Patient has been counseled extensively about nutrition and exercise as well as the importance of adherence with medications and regular follow-up. The patient was given clear instructions to go to ER or return to medical center if symptoms don't improve, worsen or new problems develop. The patient verbalized understanding.   Follow-up: Return in about 3 months (around 05/10/2022).   Gildardo Pounds, FNP-BC [facility] and [place], [place]   02/09/2022, 12:33 PM

## 2022-02-09 NOTE — Telephone Encounter (Signed)
Called was unable to leave message voicemail box is not setup.

## 2022-02-15 NOTE — Telephone Encounter (Signed)
The patient has an appointment with APP on 02/19/22.

## 2022-02-15 NOTE — Telephone Encounter (Signed)
Called on 2/8 @ [phone] unable to leave message on the voicemail

## 2022-02-19 ENCOUNTER — Encounter: Payer: Self-pay | Admitting: Physician Assistant

## 2022-02-19 ENCOUNTER — Ambulatory Visit: Payer: Medicare Other | Attending: Physician Assistant | Admitting: Physician Assistant

## 2022-02-19 VITALS — BP 94/56 | HR 74 | Ht 69.0 in | Wt 166.8 lb

## 2022-02-19 DIAGNOSIS — Z992 Dependence on renal dialysis: Secondary | ICD-10-CM

## 2022-02-19 DIAGNOSIS — I50810 Right heart failure, unspecified: Secondary | ICD-10-CM

## 2022-02-19 DIAGNOSIS — N186 End stage renal disease: Secondary | ICD-10-CM

## 2022-02-19 DIAGNOSIS — I4891 Unspecified atrial fibrillation: Secondary | ICD-10-CM | POA: Diagnosis not present

## 2022-02-19 DIAGNOSIS — I428 Other cardiomyopathies: Secondary | ICD-10-CM | POA: Diagnosis not present

## 2022-02-19 MED ORDER — MIDODRINE HCL 5 MG PO TABS: ORAL_TABLET | ORAL | 3 refills | Status: DC

## 2022-02-19 NOTE — Progress Notes (Signed)
Office Visit    Patient Name: Nathaniel White Date of Encounter: 02/19/2022  PCP:  Gildardo Pounds, NP   [place]  Cardiologist:  Lauree Chandler, MD  Advanced Practice Provider:  No care team member to display Electrophysiologist:  None   HPI     Nathaniel White is a 62 y.o. male with a past medical history significant for nonischemic cardiomyopathy with LVEF 30% (EF 40 to 45% then 35% on echocardiogram), HTN, HLD, PAD, ESRD on HD (T, T, S), failed renal transplant in 2010, COPD, DM 2, tobacco use, cerebrovascular accident with residual left-sided weakness, history of GI bleed, thrombocytopenia, and recent admission for acute on chronic systolic heart failure and new onset atrial fibrillation presents today for follow-up appointment.  The patient is a 62 year old man with a history of known nonischemic cardiomyopathy with cardiac cath 2015 without obstructive disease who was last seen in the clinic by Dr. Angelena Form in 2020 but then was lost to follow-up.  He was recently admitted 3/2 through 03/15/2021.  He presented for revision of AV fistula with postop course complicated by hypotension and respiratory failure requiring intubation.  Episode thought to be due to vasoplegia in the setting of underlying ESRD.  TTE 03/13/2021 with LVEF 35% (from 40 to 45%), with moderate RV dysfunction, severe PHTN, moderate MR, RAP 15 mmHg.  Also noted to have new onset atrial fibrillation.  Seen by Dr. Johney Frame during this admission and she recommended considering a repeat ischemic evaluation as an outpatient.  Unable to tolerate GDMT due to hypotension and ESRD.  She cautiously started him on apixaban 2.5 mg twice a day given his history of GI bleeding and thrombocytopenia.  She mention consideration of watchman in the future if he can demonstrate compliance with follow-up.    He was last seen by the structural team on 03/24/2022 for follow-up.  He reported doing well since  leaving the hospital.  No lower extremity edema.  No chest pain or palpitations.  His aide reported that he had continuous blood in his stool for several months.  This has reportedly not gotten worse since starting Eliquis.  Patient was very reluctant to admit this and thinks that may be due to hemorrhoids.  Today, he is mainly worried about his low blood pressure.  He is a dialysis patient and takes midodrine on his dialysis days.  He takes 10 mg twice daily.  He stopped smoking and his blood pressure went down at that point.  He drinks about 64 ounces of water a day.  He states he feels sluggish.  Blood pressure 94/56 today.  Reports no shortness of breath nor dyspnea on exertion. Reports no chest pain, pressure, or tightness. No edema, orthopnea, PND. Reports no palpitations.   Past Medical History    Past Medical History:  Diagnosis Date   Anemia    Anxiety    Asthma    CHF (congestive heart failure) ([place])    Complication from renal dialysis device [id number]   Complication of anesthesia 2017   according to pt and spouse pt was moving around and cough while under and pt had difficulty waking up so the anesthesia had to be reversed. and pt admitted to ICU.    COPD (chronic obstructive pulmonary disease) (HCC)    Diabetes mellitus without complication ([place])    Type II - Patient states he does not have diabetes, "they said sometimes when you start dialysis you don't have diabetes any longer"  ESOPHAGEAL VARICES 10/04/2008   Qualifier: Diagnosis of  By: Fuller Plan MD Lamont Snowball T    ESRD    on HD, T-TH-Sat - [place]   Hemiparesis due to old stroke [facility])    left   Hypertension    Hx, not current problems, no meds   LV dysfunction    EF 25-30% by echo 07/2011   Memory loss due to medical condition    due to stroke   MR (mitral regurgitation)    moderate to severe, echo 07/2011   Nausea with vomiting, unspecified 06/15/2021   Paroxysmal atrial fibrillation (HCC)    Peripheral  vascular disease ([place])    Shortness of breath    with exertion   Stroke ([place])    TIA's-left sided weakness   Tobacco abuse    Past Surgical History:  Procedure Laterality Date   ABDOMINAL AORTOGRAM W/LOWER EXTREMITY Bilateral 07/21/2018   Procedure: ABDOMINAL AORTOGRAM W/LOWER EXTREMITY;  Surgeon: Waynetta Sandy, MD;  Location: [place] [facility];  Service: Cardiovascular;  Laterality: Bilateral;   AMPUTATION Left 11/05/2018   Procedure: AMPUTATION SECOND TOE LEFT FOOT;  Surgeon: Waynetta Sandy, MD;  Location: [place];  Service: Vascular;  Laterality: Left;   AMPUTATION Left 02/13/2021   Procedure: Left small finger AMPUTATION DIGIT;  Surgeon: Sherilyn Cooter, MD;  Location: [place];  Service: Orthopedics;  Laterality: Left;   AV FISTULA PLACEMENT     CARDIAC CATHETERIZATION     [facility]   COLONOSCOPY W/ BIOPSIES AND POLYPECTOMY     FISTULA SUPERFICIALIZATION Left 11/10/2013   Procedure: FISTULA PLICATION;  Surgeon: Serafina Mitchell, MD;  Location: [place];  Service: Vascular;  Laterality: Left;   INSERTION OF DIALYSIS CATHETER N/A 06/20/2021   Procedure: INSERTION OF DIALYSIS CATHETER;  Surgeon: Broadus John, MD;  Location: [facility] OR;  Service: Vascular;  Laterality: N/A;   KIDNEY TRANSPLANT     2011 rejected kidney 2012 back on dialysis   LEFT AND RIGHT HEART CATHETERIZATION WITH CORONARY ANGIOGRAM N/A 10/09/2013   Procedure: LEFT AND RIGHT HEART CATHETERIZATION WITH CORONARY ANGIOGRAM;  Surgeon: Burnell Blanks, MD;  Location: [facility] C[facility] LAB;  Service: Cardiovascular;  Laterality: N/A;   LIGATION OF ARTERIOVENOUS  FISTULA Left 06/20/2021   Procedure: LIGATION OF ARTERIOVENOUS  FISTULA;  Surgeon: Broadus John, MD;  Location: [place];  Service: Vascular;  Laterality: Left;   PERIPHERAL VASCULAR INTERVENTION Left 07/21/2018   Procedure: PERIPHERAL VASCULAR INTERVENTION;  Surgeon: Waynetta Sandy, MD;  Location: [place] [facility];  Service:  Cardiovascular;  Laterality: Left;  Popliteal   REVISON OF ARTERIOVENOUS FISTULA Left 08/26/2013   Procedure: EXCISION OF ERODED SKIN AND EXPLORATION OF MAIN LEFT UPPER ARM AV FISTULA;  Surgeon: Rosetta Posner, MD;  Location: [place];  Service: Vascular;  Laterality: Left;   REVISON OF ARTERIOVENOUS FISTULA Left 02/05/2014   Procedure: REPAIR OF ARTERIOVENOUS FISTULA ANEURYSM;  Surgeon: Serafina Mitchell, MD;  Location: [facility] OR;  Service: Vascular;  Laterality: Left;   REVISON OF ARTERIOVENOUS FISTULA Left 03/10/2021   Procedure: LEFT ARM REVISION OF ARTERIOVENOUS FISTULA WITH PLICATION;  Surgeon: Waynetta Sandy, MD;  Location: [place];  Service: Vascular;  Laterality: Left;   SHUNTOGRAM N/A 11/05/2012   Procedure: Fistulogram;  Surgeon: Serafina Mitchell, MD;  Location: [facility] CATH LAB;  Service: Cardiovascular;  Laterality: N/A;   TEE WITHOUT CARDIOVERSION  08/17/2011   Procedure: TRANSESOPHAGEAL ECHOCARDIOGRAM (TEE);  Surgeon: Larey Dresser, MD;  Location: [place];  Service: Cardiovascular;  Laterality: N/A;   ULTRASOUND GUIDANCE FOR VASCULAR ACCESS  06/20/2021   Procedure: ULTRASOUND GUIDANCE FOR VASCULAR ACCESS;  Surgeon: Broadus John, MD;  Location[facility] OR;  Service: Vascular;;   VIDEO ASSISTED THORACOSCOPY (VATS)/DECORTICATION  08/10/11    Allergies  Allergies  Allergen Reactions   Codeine Shortness Of Breath   Dextromethorphan-Guaifenesin Shortness Of Breath   Iron Dextran Shortness Of Breath   Morphine And Related Shortness Of Breath    EKGs/Labs/Other Studies Reviewed:   The following studies were reviewed today:  Echo 09/05/21 IMPRESSIONS     1. Left ventricular ejection fraction, by estimation, is 30 to 35%. The  left ventricle has moderately decreased function. The left ventricle  demonstrates global hypokinesis. There is severe concentric left  ventricular hypertrophy. Left ventricular  diastolic parameters are indeterminate.   2. Right ventricular systolic function is  moderately reduced. The right  ventricular size is severely enlarged. There is severely elevated  pulmonary artery systolic pressure. The estimated right ventricular  systolic pressure is 123XX123 mmHg.   3. Left atrial size was severely dilated.   4. Right atrial size was mildly dilated.   5. Regurgitant volume 42 ml, No right sided pulmonary vein systolic  blunting. There is a mixed etiology of mitral regurgitation: posterior  restriction and functional MR. Calcified mitral valve with at least mild  mitral regurgitation. The mitral valve is   degenerative. Moderate to severe mitral valve regurgitation. Mild mitral  stenosis. The mean mitral valve gradient is 5.0 mmHg with average heart  rate of 79 bpm. Moderate mitral annular calcification.   6. Tricuspid valve regurgitation is moderate to severe.   7. The aortic valve is tricuspid. There is mild calcification of the  aortic valve. There is mild thickening of the aortic valve. Aortic valve  regurgitation is not visualized. Aortic valve sclerosis is present, with  no evidence of aortic valve stenosis.   Comparison(s): Prior images reviewed side by side. Increase in mitral  valve and tricuspid valve regurgitation from prior. When patient is at dry  weight, consideration of TEE for characterization of MR/MS is reasonable  (inpatient vs outpatient).   FINDINGS   Left Ventricle: Left ventricular ejection fraction, by estimation, is 30  to 35%. The left ventricle has moderately decreased function. The left  ventricle demonstrates global hypokinesis. Definity contrast agent was  given IV to delineate the left  ventricular endocardial borders. The left ventricular internal cavity size  was normal in size. There is severe concentric left ventricular  hypertrophy. Left ventricular diastolic parameters are indeterminate.   Right Ventricle: The right ventricular size is severely enlarged. No  increase in right ventricular wall thickness. Right  ventricular systolic  function is moderately reduced. There is severely elevated pulmonary  artery systolic pressure. The tricuspid  regurgitant velocity is 3.69 m/s, and with an assumed right atrial  pressure of 8 mmHg, the estimated right ventricular systolic pressure is  123XX123 mmHg.   Left Atrium: Left atrial size was severely dilated.   Right Atrium: Right atrial size was mildly dilated.   Pericardium: There is no evidence of pericardial effusion.   Mitral Valve: Regurgitant volume 42 ml, No right sided pulmonary vein  systolic blunting. There is a mixed etiology of mitral regurgitation:  posterior restriction and functional MR. Calcified mitral valve with at  least mild mitral regurgitation. The  mitral valve is degenerative in appearance. Moderate mitral annular  calcification. Moderate to severe mitral valve regurgitation. Mild mitral  valve  stenosis. The mean mitral valve gradient is 5.0 mmHg with average  heart rate of 79 bpm.   Tricuspid Valve: The tricuspid valve is normal in structure. Tricuspid  valve regurgitation is moderate to severe.   Aortic Valve: The aortic valve is tricuspid. There is mild calcification  of the aortic valve. There is mild thickening of the aortic valve. There  is moderate aortic valve annular calcification. Aortic valve regurgitation  is not visualized. Aortic valve  sclerosis is present, with no evidence of aortic valve stenosis. Aortic  valve mean gradient measures 2.0 mmHg. Aortic valve peak gradient measures  4.5 mmHg. Aortic valve area, by VTI measures 1.47 cm.   Pulmonic Valve: The pulmonic valve was normal in structure. Pulmonic valve  regurgitation is trivial. No evidence of pulmonic stenosis.   Aorta: The aortic root is normal in size and structure.    EKG:  EKG is not ordered today.  Recent Labs: 09/13/2021: B Natriuretic Peptide 3,508.3 09/14/2021: ALT 9; Magnesium 1.9 09/16/2021: Hemoglobin 11.0; Platelets 80 09/17/2021: BUN 22;  Creatinine, Ser 4.15; Potassium 3.8; Sodium 136  Recent Lipid Panel    Component Value Date/Time   CHOL 97 09/07/2021 0329   CHOL 130 06/07/2021 1621   TRIG 44 09/07/2021 0329   HDL 51 09/07/2021 0329   HDL 58 06/07/2021 1621   CHOLHDL 1.9 09/07/2021 0329   VLDL 9 09/07/2021 0329   LDLCALC 37 09/07/2021 0329   LDLCALC 59 06/07/2021 1621    Home Medications   Current Meds  Medication Sig   acetaminophen (TYLENOL) 500 MG tablet Take 1 tablet (500 mg total) by mouth every 6 (six) hours as needed. (Patient taking differently: Take 500-1,000 mg by mouth 2 (two) times daily as needed for headache.)   albuterol (PROAIR HFA) 108 (90 Base) MCG/ACT inhaler Inhale 2 puffs into the lungs every 6 (six) hours as needed for wheezing or shortness of breath.   albuterol (PROVENTIL) (2.5 MG/3ML) 0.083% nebulizer solution Take 3 mLs (2.5 mg total) by nebulization every 6 (six) hours as needed for wheezing or shortness of breath.   ELIQUIS 5 MG TABS tablet Take 5 mg by mouth 2 (two) times daily.   Ferrous Sulfate (IRON PO) Take 1 tablet by mouth in the morning, at noon, and at bedtime.   fluticasone-salmeterol (ADVAIR DISKUS) 250-50 MCG/ACT AEPB Inhale 1 puff into the lungs in the morning and at bedtime. NEEDS PASS   hydrOXYzine (ATARAX) 10 MG tablet Take 1 tablet (10 mg total) by mouth 3 (three) times daily as needed. For anxiety (Patient taking differently: Take 10 mg by mouth 2 (two) times daily as needed for anxiety.)   midodrine (PROAMATINE) 5 MG tablet Take one tablet by mouth 3 times daily on non-dialysis days, Mon, Wed, Fri and Sun.   montelukast (SINGULAIR) 10 MG tablet Take 1 tablet (10 mg total) by mouth at bedtime.   traZODone (DESYREL) 50 MG tablet Take 1 tablet (50 mg total) by mouth at bedtime as needed for sleep.   VELPHORO 500 MG chewable tablet Chew 1,000 mg by mouth 2 (two) times daily after a meal.   VISINE 0.05 % ophthalmic solution Place 2-3 drops into both eyes 2 (two) times daily as  needed (dry eye).   [DISCONTINUED] apixaban (ELIQUIS) 2.5 MG TABS tablet Take 1 tablet (2.5 mg total) by mouth 2 (two) times daily. CONTACT CARDIOLOGY FOR REFILLS     Review of Systems      All other systems reviewed and are otherwise negative  except as noted above.  Physical Exam    VS:  BP (!) 94/56   Pulse 74   Ht 5' 9"$  (1.753 m)   Wt 166 lb 12.8 oz (75.7 kg)   SpO2 100%   BMI 24.63 kg/m  , BMI Body mass index is 24.63 kg/m.  Wt Readings from Last 3 Encounters:  02/19/22 166 lb 12.8 oz (75.7 kg)  02/09/22 164 lb 12.8 oz (74.8 kg)  09/17/21 161 lb 6 oz (73.2 kg)     GEN: Well nourished, well developed, in no acute distress. HEENT: normal. Neck: Supple, no JVD, carotid bruits, or masses. Cardiac: irregularly irregular, no murmurs, rubs, or gallops. No clubbing, cyanosis, edema.  Radials/PT 2+ and equal bilaterally.  Respiratory:  Respirations regular and unlabored, clear to auscultation bilaterally. GI: Soft, nontender, nondistended. MS: No deformity or atrophy. Skin: Warm and dry, no rash. Neuro:  Strength and sensation are intact. Psych: Normal affect.  Assessment & Plan    Nonischemic cardiomyopathy/combined systolic and diastolic HF -cath in [id number] with no CAD -unable to tolerate GDMT due to hypotension and ESRD -no indication for ischemic workup at the moment.  -most recent EF 35% -euvolemic on exam today  RV failure/ Pulm HTN -moderate RV dysfunction -On HD for volume management  Atrial fibrillation -no symptoms, remains in Afib -unable to tolerate AV nodal blocking agents due to hypotension -Eliquis 61m BID -rate controlled today  ESRD -tues, thurs, sat -166.8 lbs, close to his "dry weight" -hemoglobin 11.0, dialysis will order labs often -he takes midodrine 174mBID on his dialysis days -will add midodrine 67m66mID  on non dialysis days and asked him to track his BP        Disposition: Follow up  1 year with ChrLauree ChandlerD or  APP.  Signed, TesElgie CollardA-C 02/19/2022, 6:35 PM [facility]

## 2022-02-19 NOTE — Patient Instructions (Signed)
Medication Instructions:  Start midodrine 5 mg three times daily on non-dialysis days *If you need a refill on your cardiac medications before your next appointment, please call your pharmacy*   Lab Work: None ordered If you have labs (blood work) drawn today and your tests are completely normal, you will receive your results only by: [place] (if you have MyChart) OR A paper copy in the mail If you have any lab test that is abnormal or we need to change your treatment, we will call you to review the results.   Follow-Up: At [facility], you and your health needs are our priority.  As part of our continuing mission to provide you with exceptional heart care, we have created designated Provider Care Teams.  These Care Teams include your primary Cardiologist (physician) and Advanced Practice Providers (APPs -  Physician Assistants and Nurse Practitioners) who all work together to provide you with the care you need, when you need it.  We recommend signing up for the patient portal called "MyChart".  Sign up information is provided on this After Visit Summary.  MyChart is used to connect with patients for Virtual Visits (Telemedicine).  Patients are able to view lab/test results, encounter notes, upcoming appointments, etc.  Non-urgent messages can be sent to your provider as well.   To learn more about what you can do with MyChart, go to [email or web address].    Your next appointment:   3-4 week(s)  Provider:   Lauree Chandler, MD  or APP  Other Instructions Check your blood pressure daily, one hour after morning medications and keep a log to bring with you to your next visit.

## 2022-02-24 ENCOUNTER — Emergency Department (HOSPITAL_COMMUNITY): Payer: Medicare Other

## 2022-02-24 ENCOUNTER — Encounter (HOSPITAL_COMMUNITY): Payer: Self-pay | Admitting: Emergency Medicine

## 2022-02-24 ENCOUNTER — Emergency Department (HOSPITAL_COMMUNITY)
Admission: EM | Admit: 2022-02-24 | Discharge: 2022-02-24 | Disposition: A | Discharge status: Home / Self Care | Payer: Medicare Other | Attending: Emergency Medicine | Admitting: Emergency Medicine

## 2022-02-24 ENCOUNTER — Other Ambulatory Visit: Payer: Self-pay

## 2022-02-24 DIAGNOSIS — Z7951 Long term (current) use of inhaled steroids: Secondary | ICD-10-CM | POA: Diagnosis not present

## 2022-02-24 DIAGNOSIS — R079 Chest pain, unspecified: Secondary | ICD-10-CM | POA: Diagnosis present

## 2022-02-24 DIAGNOSIS — Z7901 Long term (current) use of anticoagulants: Secondary | ICD-10-CM | POA: Diagnosis not present

## 2022-02-24 DIAGNOSIS — I2510 Atherosclerotic heart disease of native coronary artery without angina pectoris: Secondary | ICD-10-CM | POA: Diagnosis not present

## 2022-02-24 DIAGNOSIS — I132 Hypertensive heart and chronic kidney disease with heart failure and with stage 5 chronic kidney disease, or end stage renal disease: Secondary | ICD-10-CM | POA: Diagnosis not present

## 2022-02-24 DIAGNOSIS — R002 Palpitations: Secondary | ICD-10-CM | POA: Insufficient documentation

## 2022-02-24 DIAGNOSIS — I959 Hypotension, unspecified: Secondary | ICD-10-CM | POA: Diagnosis not present

## 2022-02-24 DIAGNOSIS — J45909 Unspecified asthma, uncomplicated: Secondary | ICD-10-CM | POA: Insufficient documentation

## 2022-02-24 DIAGNOSIS — N186 End stage renal disease: Secondary | ICD-10-CM | POA: Insufficient documentation

## 2022-02-24 DIAGNOSIS — I480 Paroxysmal atrial fibrillation: Secondary | ICD-10-CM | POA: Insufficient documentation

## 2022-02-24 DIAGNOSIS — Z992 Dependence on renal dialysis: Secondary | ICD-10-CM | POA: Diagnosis not present

## 2022-02-24 DIAGNOSIS — I509 Heart failure, unspecified: Secondary | ICD-10-CM | POA: Insufficient documentation

## 2022-02-24 DIAGNOSIS — J449 Chronic obstructive pulmonary disease, unspecified: Secondary | ICD-10-CM | POA: Insufficient documentation

## 2022-02-24 LAB — BASIC METABOLIC PANEL
Anion gap: 16 — ABNORMAL HIGH (ref 5–15)
BUN: 31 mg/dL — ABNORMAL HIGH (ref 6–20)
CO2: 24 mmol/L (ref 22–32)
Calcium: 8.5 mg/dL — ABNORMAL LOW (ref 8.9–10.3)
Chloride: 93 mmol/L — ABNORMAL LOW (ref 98–111)
Creatinine, Ser: 7.28 mg/dL — ABNORMAL HIGH (ref 0.61–1.24)
GFR, Estimated: 8 mL/min — ABNORMAL LOW (ref >60)
Glucose, Bld: 79 mg/dL (ref 70–99)
Potassium: 4 mmol/L (ref 3.5–5.1)
Sodium: 133 mmol/L — ABNORMAL LOW (ref 135–145)

## 2022-02-24 LAB — TROPONIN I (HIGH SENSITIVITY)
Troponin I (High Sensitivity): 95 ng/L — ABNORMAL HIGH (ref <18)
Troponin I (High Sensitivity): 92 ng/L — ABNORMAL HIGH (ref <18)

## 2022-02-24 LAB — CBC
HCT: 49.1 % (ref 39.0–52.0)
Hemoglobin: 15.2 g/dL (ref 13.0–17.0)
MCH: 28.7 pg (ref 26.0–34.0)
MCHC: 31 g/dL (ref 30.0–36.0)
MCV: 92.8 fL (ref 80.0–100.0)
Platelets: 77 10*3/uL — ABNORMAL LOW (ref 150–400)
RBC: 5.29 MIL/uL (ref 4.22–5.81)
RDW: 17.6 % — ABNORMAL HIGH (ref 11.5–15.5)
WBC: 5.7 10*3/uL (ref 4.0–10.5)
nRBC: 0 % (ref 0.0–0.2)

## 2022-02-24 NOTE — ED Triage Notes (Signed)
Patient here for complaint that the last several mornings he has woken feeling like his heart is racing for several minutes and then it resolves spontaneously. History of CKD on dialysis, last full treatment today, access in chest. Patient is alert, oriented, speaking in complete sentences and is in no apparent distress at this time.

## 2022-02-24 NOTE — ED Provider Notes (Signed)
Nathaniel White Note   CSN: [id number] Arrival date & time: 02/24/22  1226     History  Chief Complaint  Patient presents with   Chest Pain    Nathaniel White is a 62 y.o. male with extensive history detailed below presenting to the ED with episodes of chest pain and palpitations over the last 2 weeks since starting midodrine for hypotension.  Usually occurs immediately when he wakes, last for 1-2 minutes, then resolves spontaneously.  Without symptoms throughout the remainder of the day.  Dialysis T/Th/S.  Had dialysis today, session was completed.  No active chest pain, shortness of breath, palpitations, weakness, dizziness, or recent injury.  Extensive history includes ESRD on dialysis, DMT2, history of nonadherence to medical treatment, CHF, CAD, paroxysmal A-fib, prior CVA, severe pulmonary hypertension, anemia of chronic disease, chronic tobacco use, anxiety, asthma, COPD, hemiparesis due to prior stroke, mitral regurg, memory loss chronic.  Also noted surgical history for kidney transplant, cardiac catheterization.  On chronic Eliquis.  The history is provided by the patient and medical records.  Chest Pain      Home Medications Prior to Admission medications   Medication Sig Start Date End Date Taking? Authorizing White  acetaminophen (TYLENOL) 500 MG tablet Take 1 tablet (500 mg total) by mouth every 6 (six) hours as needed. Patient taking differently: Take 500-1,000 mg by mouth 2 (two) times daily as needed for headache. 04/11/18   Zigmund Gottron, NP  albuterol (PROAIR HFA) 108 (90 Base) MCG/ACT inhaler Inhale 2 puffs into the lungs every 6 (six) hours as needed for wheezing or shortness of breath. 08/25/21   Charlott Rakes, MD  albuterol (PROVENTIL) (2.5 MG/3ML) 0.083% nebulizer solution Take 3 mLs (2.5 mg total) by nebulization every 6 (six) hours as needed for wheezing or shortness of breath. 08/25/21   Charlott Rakes, MD  ELIQUIS 5 MG TABS tablet Take 5 mg by mouth 2 (two) times daily. 12/09/21   White, Historical, MD  Ferrous Sulfate (IRON PO) Take 1 tablet by mouth in the morning, at noon, and at bedtime.    White, Historical, MD  fluticasone-salmeterol (ADVAIR DISKUS) 250-50 MCG/ACT AEPB Inhale 1 puff into the lungs in the morning and at bedtime. NEEDS PASS 08/09/21   Gildardo Pounds, NP  hydrOXYzine (ATARAX) 10 MG tablet Take 1 tablet (10 mg total) by mouth 3 (three) times daily as needed. For anxiety Patient taking differently: Take 10 mg by mouth 2 (two) times daily as needed for anxiety. 06/07/21   Gildardo Pounds, NP  midodrine (PROAMATINE) 5 MG tablet Take one tablet by mouth 3 times daily on non-dialysis days, Mon, Wed, Fri and Sun. 02/19/22   Elgie Collard, PA-C  montelukast (SINGULAIR) 10 MG tablet Take 1 tablet (10 mg total) by mouth at bedtime. 06/07/21   Gildardo Pounds, NP  traZODone (DESYREL) 50 MG tablet Take 1 tablet (50 mg total) by mouth at bedtime as needed for sleep. 09/08/21   Allie Bossier, MD  VELPHORO 500 MG chewable tablet Chew 1,000 mg by mouth 2 (two) times daily after a meal. 02/06/21   White, Historical, MD  VISINE 0.05 % ophthalmic solution Place 2-3 drops into both eyes 2 (two) times daily as needed (dry eye). 07/04/21   White, Historical, MD      Allergies    Codeine, Dextromethorphan-guaifenesin, Iron dextran, and Morphine and related    Review of Systems   Review of Systems  Cardiovascular:  Positive for chest pain.    Physical Exam Updated Vital Signs BP (!) 109/57   Pulse (!) 51   Temp 97.9 F (36.6 C)   Resp 15   SpO2 93%  Physical Exam Vitals and nursing note reviewed.  Constitutional:      General: He is not in acute distress.    Appearance: He is well-developed. He is not ill-appearing, toxic-appearing or diaphoretic.  HENT:     Head: Normocephalic and atraumatic.  Eyes:     General: Gaze aligned appropriately.      Conjunctiva/sclera: Conjunctivae normal.  Cardiovascular:     Rate and Rhythm: Bradycardia present. Rhythm irregularly irregular.     Heart sounds: No murmur heard. Pulmonary:     Effort: Pulmonary effort is normal. No tachypnea, accessory muscle usage or respiratory distress.     Breath sounds: Normal breath sounds. No stridor.  Chest:       Comments: Dialysis access port with clean bandage in place as above Abdominal:     Palpations: Abdomen is soft.     Tenderness: There is no abdominal tenderness.  Musculoskeletal:        General: No swelling.     Cervical back: Neck supple.     Right lower leg: No tenderness. No edema.     Left lower leg: No tenderness. No edema.  Lymphadenopathy:     Cervical: No cervical adenopathy.  Skin:    General: Skin is warm and dry.     Capillary Refill: Capillary refill takes less than 2 seconds.  Neurological:     Mental Status: He is alert and oriented to person, place, and time. Mental status is at baseline.     GCS: GCS eye subscore is 4. GCS verbal subscore is 5. GCS motor subscore is 6.  Psychiatric:        Mood and Affect: Mood normal.     ED Results / Procedures / Treatments   Labs (all labs ordered are listed, but only abnormal results are displayed) Labs Reviewed  BASIC METABOLIC PANEL - Abnormal; Notable for the following components:      Result Value   Sodium 133 (*)    Chloride 93 (*)    BUN 31 (*)    Creatinine, Ser 7.28 (*)    Calcium 8.5 (*)    GFR, Estimated 8 (*)    Anion gap 16 (*)    All other components within normal limits  CBC - Abnormal; Notable for the following components:   RDW 17.6 (*)    Platelets 77 (*)    All other components within normal limits  TROPONIN I (HIGH SENSITIVITY) - Abnormal; Notable for the following components:   Troponin I (High Sensitivity) 95 (*)    All other components within normal limits  TROPONIN I (HIGH SENSITIVITY) - Abnormal; Notable for the following components:   Troponin I  (High Sensitivity) 92 (*)    All other components within normal limits    EKG EKG Interpretation  Date/Time:  Saturday February 24 2022 12:42:22 EST Ventricular Rate:  74 PR Interval:    QRS Duration: 128 QT Interval:  464 QTC Calculation: 515 R Axis:   -16 Text Interpretation: Atrial fibrillation with premature ventricular or aberrantly conducted complexes Non-specific intra-ventricular conduction block Minimal voltage criteria for LVH, may be normal variant ( Cornell product ) Cannot rule out Septal infarct , age undetermined Lateral infarct , age undetermined Possible Inferior infarct , age undetermined Abnormal ECG Confirmed by Doren Custard,  Ryan [phone]) on 02/24/2022 12:57:52 PM  Radiology DG Chest 2 View  Result Date: 02/24/2022 CLINICAL DATA:  62 year old male with history of chest pain. EXAM: CHEST - 2 VIEW COMPARISON:  Chest x-ray 09/17/2021 FINDINGS: Left internal jugular PermCath with tip terminating in the distal superior vena cava. Lung volumes are slightly low. Calcified pleural plaques are again noted in the right hemithorax, most evident at the lung base. Chronic pleuroparenchymal thickening and architectural distortion in the right hemithorax, stable compared to the prior study. No definite pleural effusions. Diffuse interstitial prominence and peribronchial cuffing noted, similar to prior examinations. No definite cephalization of the pulmonary vasculature. Mild cardiomegaly. Upper mediastinal contours are within normal limits. Atherosclerotic calcifications are noted in the thoracic aorta. IMPRESSION: 1. Widespread interstitial prominence and peribronchial cuffing, stable compared to prior studies, potentially indicative of chronic bronchitis. 2. Mild cardiomegaly. 3. Aortic atherosclerosis. 4. Chronic pleural scarring and pleural calcifications in the right hemithorax, similar to prior studies, presumably from remote right-sided pleural hemorrhage and/or infection. Electronically Signed    By: Vinnie Langton M.D.   On: 02/24/2022 13:23    Procedures Procedures    Medications Ordered in ED Medications - No data to display  ED Course/ Medical Decision Making/ A&P Clinical Course as of 02/24/22 1623  Sat Feb 24, 2022  1340 Troponin I (High Sensitivity)(!): 95 [AC]  1340 Mildly elevated from baseline, may be due to accumulation and dialysis [AC]  1617 Consulted with Dr. Stanford Breed for cardiology.  Discussed patient's history and reviewed labs from today, including mildly elevated troponins.  Less concerned of these, as they appear to be very close to baseline.  Agrees symptoms experienced in the morning likely due to medication dosage change, but is still important for his blood pressure to prevent presyncope/dizziness.  Also recommends close outpatient follow-up with cardiology.  No further recommendations at this time. [AC]    Clinical Course User Index [AC] Prince Rome, PA-C                             Medical Decision Making Amount and/or Complexity of Data Reviewed Labs: ordered. Radiology: ordered.   62 y.o. male presents to the ED for concern of Chest Pain     This involves an extensive number of treatment options, and is a complaint that carries with it a high risk of complications and morbidity.  The emergent differential diagnosis prior to evaluation includes, but is not limited to: ACS, pneumonia, pneumothorax, pulmonary embolism,pericarditis/myocarditis, GERD, PUD, musculoskeletal, costochondritis, anxiousness  This is not an exhaustive differential.   Past Medical History / Co-morbidities / Social History: Extensive history includes ESRD on dialysis, DMT2, history of nonadherence to medical treatment, CHF, CAD, paroxysmal A-fib, prior CVA, severe pulmonary hypertension, anemia of chronic disease, chronic tobacco use, anxiety, asthma, COPD, hemiparesis due to prior stroke, mitral regurg, memory loss chronic. Also noted surgical history for kidney  transplant, cardiac catheterization. Social Determinants of Health include: Medical noncompliance  Additional History:  Obtained by chart review.  Notably  02/19/22 [facility] Tessa Conte PA-C "Today, he is mainly worried about his low blood pressure.  He is a dialysis patient and takes midodrine on his dialysis days.  He takes 10 mg twice daily.  He stopped smoking and his blood pressure went down at that point.  He drinks about 64 ounces of water a day.  He states he feels sluggish.  Blood pressure 94/56 today... he takes midodrine 60m BID  on his dialysis days... will add midodrine 93m TID  on non dialysis days and asked him to track his BP"  Lab Tests: I ordered, and personally interpreted labs.  The pertinent results include:   Troponin 95, 92 Recent values of 75,, 74, and 62 within the last 5 months.  Close to baseline  WC 5.7, no anemia Sodium 133, chloride 193, creatinine 7.28, BUN 31, calcium 8.5, GFR 8, anion gap 16  Imaging Studies: I ordered imaging studies including CXR .   I independently visualized and interpreted imaging which showed mild cardiomegaly, evidence of chronic bronchitis, without acute cardiopulmonary pathologic findings I agree with the radiologist interpretation.  Cardiac Monitoring: The patient was maintained on a cardiac monitor.  I personally viewed and interpreted the cardiac monitored which showed an underlying rhythm of: Sinus bradycardia  ED Course / Critical Interventions: Pt well-appearing on exam.   HEAR score of 4, moderate risk.  Patient with Hx of chronic hypotension, takes midodrine to counter this, prescribed by .  Mildly hypotensive on exam, close to patient's baseline per patient.  Without active chest pain or discomfort.  ASA and nitroglycerin held, do not seem appropriate at this time.  Well's score 1.5, low probability.  Labs unremarkable.  Unlikely pneumonia, no cough, no leukocytosis, no fevers, CXR and exam without acute findings.  Unlikely  pneumothorax, no findings on CXR.  Unlikely pericarditis/myocarditis, GERD, PUD, as does not fit clinical picture. Chest pain not exertional, occurs immediately when waking in the morning and lasts no more than 30 to 60 seconds before resolving on its own.  Does not occur throughout the rest the day, until the following morning.  This only began 1 started new medication of midodrine.  No evidence of pleural effusion or pulmonary edema on CXR.  Unlikely dissection, no pulse deficit, no tearing chest pain, no neurologic complaints.  Due to patient's considerable risk plan to rule out ACS. EKG findings indicate Afib with mild bradycardia without significant changes from prior.  Troponins close to baseline.  Consulted with cardiology, see note above.  Patient's clinical presentation is most consistent with reaction to midodrine. Upon reevaluation, patient remains hemodynamically stable, mildly hypotensive again at baseline.  Shared decision making with patient and shared recommendations from cardiology.  Patient would like to go home and follow-up closely with cardiology, I find this reasonable.  Strict return precautions discussed.  Patient in NAD and good condition at time of discharge. I have reviewed the patients home medicines and have made adjustments as needed.  Disposition: Considered admission and after reviewing the patient's encounter today, I feel that the patient would benefit from close outpatient cardiology follow-up.  Internal referral placed.  Discussed course of treatment with the patient, whom demonstrated understanding.  Patient in agreement and has no further questions.    I discussed this case with my attending, Dr. DDoren Custard  Attending physician stated agreement with plan or made changes to plan which were implemented.     This chart was dictated using voice recognition software.  Despite best efforts to proofread, errors can occur which can change the documentation  meaning.         Final Clinical Impression(s) / ED Diagnoses Final diagnoses:  Nonspecific chest pain  [place]  [place]oxysmal atrial fibrillation ([place]    Rx / DC Orders ED Discharge Orders          Ordered    Ambulatory referral to Cardiology  Comments: If you have not heard from the Cardiology office within the next 72 hours please call [phone].   02/24/22 1620              Candace Cruise [id number]    Godfrey Pick, MD 02/27/22 [phone]

## 2022-02-24 NOTE — Discharge Instructions (Signed)
You were evaluated in the emergency department today for episodes of palpitations and chest pain.  Your overall workup today was nonspecific for any one condition.  Please follow-up closely with cardiology within the next 2 to 3 days.  An internal referral has been placed on your behalf, you should receive a phone call within 2 to 3 days to schedule your appointment for follow-up.  If you have not received a phone call by Tuesday morning, please call to schedule your appointment.  Return to the ED for new or worsening symptoms as discussed.  Continue taking your medications as prescribed until you are able to follow-up with cardiology.

## 2022-03-09 ENCOUNTER — Telehealth: Payer: Self-pay | Admitting: Nurse Practitioner

## 2022-03-09 NOTE — Telephone Encounter (Signed)
[place] to schedule their annual wellness visit. Appointment made for 03/16/22.  Barkley Boards AWV direct phone # [phone]

## 2022-03-13 ENCOUNTER — Ambulatory Visit: Payer: Medicare Other | Admitting: Nurse Practitioner

## 2022-03-13 NOTE — Progress Notes (Deleted)
Office Visit    Patient Name: Nathaniel White Date of Encounter: 03/13/2022  PCP:  Gildardo Pounds, NP   [place]  Cardiologist:  Lauree Chandler, MD  Advanced Practice Provider:  No care team member to display Electrophysiologist:  None   HPI     Nathaniel White is a 62 y.o. male with a past medical history significant for nonischemic cardiomyopathy with LVEF 30% (EF 40 to 45% then 35% on echocardiogram), HTN, HLD, PAD, ESRD on HD (T, T, S), failed renal transplant in 2010, COPD, DM 2, tobacco use, cerebrovascular accident with residual left-sided weakness, history of GI bleed, thrombocytopenia, and recent admission for acute on chronic systolic heart failure and new onset atrial fibrillation presents today for follow-up appointment.  The patient is a 62 year old man with a history of known nonischemic cardiomyopathy with cardiac cath 2015 without obstructive disease who was last seen in the clinic by Dr. Angelena Form in 2020 but then was lost to follow-up.  He was recently admitted 3/2 through 03/15/2021.  He presented for revision of AV fistula with postop course complicated by hypotension and respiratory failure requiring intubation.  Episode thought to be due to vasoplegia in the setting of underlying ESRD.  TTE 03/13/2021 with LVEF 35% (from 40 to 45%), with moderate RV dysfunction, severe PHTN, moderate MR, RAP 15 mmHg.  Also noted to have new onset atrial fibrillation.  Seen by Dr. Johney Frame during this admission and she recommended considering a repeat ischemic evaluation as an outpatient.  Unable to tolerate GDMT due to hypotension and ESRD.  She cautiously started him on apixaban 2.5 mg twice a day given his history of GI bleeding and thrombocytopenia.  She mention consideration of watchman in the future if he can demonstrate compliance with follow-up.    He was last seen by the structural team on 03/24/2022 for follow-up.  He reported doing well since leaving  the hospital.  No lower extremity edema.  No chest pain or palpitations.  His aide reported that he had continuous blood in his stool for several months.  This has reportedly not gotten worse since starting Eliquis.  Patient was very reluctant to admit this and thinks that may be due to hemorrhoids.  He was seen by me 02/21/22, and was mainly worried about his low blood pressure.  He is a dialysis patient and takes midodrine on his dialysis days.  He takes 10 mg twice daily.  He stopped smoking and his blood pressure went down at that point.  He drinks about 64 ounces of water a day.  He states he feels sluggish.  Blood pressure 94/56 today.  He presented to the ED 02/24/2022 with a chief complaint of chest pain.  It usually occurred immediately when he wakes up and last for about 1 to 2 minutes then resolve spontaneously.  Without symptoms throughout the remainder of the day.  Dialysis T/TH/S.  No active chest pain at that time or shortness of breath.  No palpitations, weakness, dizziness, or recent injury.  EKG showed atrial fibrillation with premature ventricular or aberrantly conducted complexes.  Rate 74 bpm.  Troponin was ordered and showed 95, 92.  Recent values 75, 72 and 62 within the last 5 months, close to baseline.  Chest x-ray was also ordered which showed mild cardiomegaly, evidence of chronic bronchitis without acute cardiopulmonary finding.  Today, he ***  Past Medical History    Past Medical History:  Diagnosis Date   Anemia  Anxiety    Asthma    CHF (congestive heart failure) ([place])    Complication from renal dialysis device [id number]   Complication of anesthesia 2017   according to pt and spouse pt was moving around and cough while under and pt had difficulty waking up so the anesthesia had to be reversed. and pt admitted to ICU.    COPD (chronic obstructive pulmonary disease) (HCC)    Diabetes mellitus without complication ([place])    Type II - Patient states he does not have  diabetes, "they said sometimes when you start dialysis you don't have diabetes any longer"    ESOPHAGEAL VARICES 10/04/2008   Qualifier: Diagnosis of  By: Fuller Plan MD Lamont Snowball T    ESRD    on HD, T-TH-Sat - [place]   Hemiparesis due to old stroke [facility])    left   Hypertension    Hx, not current problems, no meds   LV dysfunction    EF 25-30% by echo 07/2011   Memory loss due to medical condition    due to stroke   MR (mitral regurgitation)    moderate to severe, echo 07/2011   Nausea with vomiting, unspecified 06/15/2021   Paroxysmal atrial fibrillation (HCC)    Peripheral vascular disease ([place])    Shortness of breath    with exertion   Stroke ([place])    TIA's-left sided weakness   Tobacco abuse    Past Surgical History:  Procedure Laterality Date   ABDOMINAL AORTOGRAM W/LOWER EXTREMITY Bilateral 07/21/2018   Procedure: ABDOMINAL AORTOGRAM W/LOWER EXTREMITY;  Surgeon: Waynetta Sandy, MD;  Location: [place] [facility];  Service: Cardiovascular;  Laterality: Bilateral;   AMPUTATION Left 11/05/2018   Procedure: AMPUTATION SECOND TOE LEFT FOOT;  Surgeon: Waynetta Sandy, MD;  Location: [place];  Service: Vascular;  Laterality: Left;   AMPUTATION Left 02/13/2021   Procedure: Left small finger AMPUTATION DIGIT;  Surgeon: Sherilyn Cooter, MD;  Location: [place];  Service: Orthopedics;  Laterality: Left;   AV FISTULA PLACEMENT     CARDIAC CATHETERIZATION     [place] [facility]   COLONOSCOPY W/ BIOPSIES AND POLYPECTOMY     FISTULA SUPERFICIALIZATION Left 11/10/2013   Procedure: FISTULA PLICATION;  Surgeon: Serafina Mitchell, MD;  Location: [place];  Service: Vascular;  Laterality: Left;   INSERTION OF DIALYSIS CATHETER N/A 06/20/2021   Procedure: INSERTION OF DIALYSIS CATHETER;  Surgeon: Broadus John, MD;  Location: [facility];  Service: Vascular;  Laterality: N/A;   KIDNEY TRANSPLANT     2011 rejected kidney 2012 back on dialysis   LEFT AND RIGHT HEART CATHETERIZATION WITH  CORONARY ANGIOGRAM N/A 10/09/2013   Procedure: LEFT AND RIGHT HEART CATHETERIZATION WITH CORONARY ANGIOGRAM;  Surgeon: Burnell Blanks, MD;  Location: [facility] CATH LAB;  Service: Cardiovascular;  Laterality: N/A;   LIGATION OF ARTERIOVENOUS  FISTULA Left 06/20/2021   Procedure: LIGATION OF ARTERIOVENOUS  FISTULA;  Surgeon: Broadus John, MD;  Location: [place];  Service: Vascular;  Laterality: Left;   PERIPHERAL VASCULAR INTERVENTION Left 07/21/2018   Procedure: PERIPHERAL VASCULAR INTERVENTION;  Surgeon: Waynetta Sandy, MD;  Location: [place] [facility];  Service: Cardiovascular;  Laterality: Left;  Popliteal   REVISON OF ARTERIOVENOUS FISTULA Left 08/26/2013   Procedure: EXCISION OF ERODED SKIN AND EXPLORATION OF MAIN LEFT UPPER ARM AV FISTULA;  Surgeon: Rosetta Posner, MD;  Location: [place];  Service: Vascular;  Laterality: Left;   REVISON OF ARTERIOVENOUS FISTULA Left 02/05/2014  Procedure: REPAIR OF ARTERIOVENOUS FISTULA ANEURYSM;  Surgeon: Serafina Mitchell, MD;  Location: [facility] OR;  Service: Vascular;  Laterality: Left;   REVISON OF ARTERIOVENOUS FISTULA Left 03/10/2021   Procedure: LEFT ARM REVISION OF ARTERIOVENOUS FISTULA WITH PLICATION;  Surgeon: Waynetta Sandy, MD;  Location: [facility];  Service: Vascular;  Laterality: Left;   SHUNTOGRAM N/A 11/05/2012   Procedure: Fistulogram;  Surgeon: Serafina Mitchell, MD;  Location: [facility] CATH LAB;  Service: Cardiovascular;  Laterality: N/A;   TEE WITHOUT CARDIOVERSION  08/17/2011   Procedure: TRANSESOPHAGEAL ECHOCARDIOGRAM (TEE);  Surgeon: Larey Dresser, MD;  Location: [place];  Service: Cardiovascular;  Laterality: N/A;   ULTRASOUND GUIDANCE FOR VASCULAR ACCESS  06/20/2021   Procedure: ULTRASOUND GUIDANCE FOR VASCULAR ACCESS;  Surgeon: Broadus John, MD;  Location: [facility] OR;  Service: Vascular;;   VIDEO ASSISTED THORACOSCOPY (VATS)/DECORTICATION  08/10/11    Allergies  Allergies  Allergen Reactions   Codeine Shortness Of Breath    Dextromethorphan-Guaifenesin Shortness Of Breath   Iron Dextran Shortness Of Breath   Morphine And Related Shortness Of Breath    EKGs/Labs/Other Studies Reviewed:   The following studies were reviewed today:  Echo 09/05/21 IMPRESSIONS     1. Left ventricular ejection fraction, by estimation, is 30 to 35%. The  left ventricle has moderately decreased function. The left ventricle  demonstrates global hypokinesis. There is severe concentric left  ventricular hypertrophy. Left ventricular  diastolic parameters are indeterminate.   2. Right ventricular systolic function is moderately reduced. The right  ventricular size is severely enlarged. There is severely elevated  pulmonary artery systolic pressure. The estimated right ventricular  systolic pressure is 123XX123 mmHg.   3. Left atrial size was severely dilated.   4. Right atrial size was mildly dilated.   5. Regurgitant volume 42 ml, No right sided pulmonary vein systolic  blunting. There is a mixed etiology of mitral regurgitation: posterior  restriction and functional MR. Calcified mitral valve with at least mild  mitral regurgitation. The mitral valve is   degenerative. Moderate to severe mitral valve regurgitation. Mild mitral  stenosis. The mean mitral valve gradient is 5.0 mmHg with average heart  rate of 79 bpm. Moderate mitral annular calcification.   6. Tricuspid valve regurgitation is moderate to severe.   7. The aortic valve is tricuspid. There is mild calcification of the  aortic valve. There is mild thickening of the aortic valve. Aortic valve  regurgitation is not visualized. Aortic valve sclerosis is present, with  no evidence of aortic valve stenosis.   Comparison(s): Prior images reviewed side by side. Increase in mitral  valve and tricuspid valve regurgitation from prior. When patient is at dry  weight, consideration of TEE for characterization of MR/MS is reasonable  (inpatient vs outpatient).   FINDINGS    Left Ventricle: Left ventricular ejection fraction, by estimation, is 30  to 35%. The left ventricle has moderately decreased function. The left  ventricle demonstrates global hypokinesis. Definity contrast agent was  given IV to delineate the left  ventricular endocardial borders. The left ventricular internal cavity size  was normal in size. There is severe concentric left ventricular  hypertrophy. Left ventricular diastolic parameters are indeterminate.   Right Ventricle: The right ventricular size is severely enlarged. No  increase in right ventricular wall thickness. Right ventricular systolic  function is moderately reduced. There is severely elevated pulmonary  artery systolic pressure. The tricuspid  regurgitant velocity is 3.69 m/s, and with an assumed right atrial  pressure of 8 mmHg, the estimated right ventricular systolic pressure is  123XX123 mmHg.   Left Atrium: Left atrial size was severely dilated.   Right Atrium: Right atrial size was mildly dilated.   Pericardium: There is no evidence of pericardial effusion.   Mitral Valve: Regurgitant volume 42 ml, No right sided pulmonary vein  systolic blunting. There is a mixed etiology of mitral regurgitation:  posterior restriction and functional MR. Calcified mitral valve with at  least mild mitral regurgitation. The  mitral valve is degenerative in appearance. Moderate mitral annular  calcification. Moderate to severe mitral valve regurgitation. Mild mitral  valve stenosis. The mean mitral valve gradient is 5.0 mmHg with average  heart rate of 79 bpm.   Tricuspid Valve: The tricuspid valve is normal in structure. Tricuspid  valve regurgitation is moderate to severe.   Aortic Valve: The aortic valve is tricuspid. There is mild calcification  of the aortic valve. There is mild thickening of the aortic valve. There  is moderate aortic valve annular calcification. Aortic valve regurgitation  is not visualized. Aortic valve   sclerosis is present, with no evidence of aortic valve stenosis. Aortic  valve mean gradient measures 2.0 mmHg. Aortic valve peak gradient measures  4.5 mmHg. Aortic valve area, by VTI measures 1.47 cm.   Pulmonic Valve: The pulmonic valve was normal in structure. Pulmonic valve  regurgitation is trivial. No evidence of pulmonic stenosis.   Aorta: The aortic root is normal in size and structure.    EKG:  EKG is not ordered today.  Recent Labs: 09/13/2021: B Natriuretic Peptide 3,508.3 09/14/2021: ALT 9; Magnesium 1.9 02/24/2022: BUN 31; Creatinine, Ser 7.28; Hemoglobin 15.2; Platelets 77; Potassium 4.0; Sodium 133  Recent Lipid Panel    Component Value Date/Time   CHOL 97 09/07/2021 0329   CHOL 130 06/07/2021 1621   TRIG 44 09/07/2021 0329   HDL 51 09/07/2021 0329   HDL 58 06/07/2021 1621   CHOLHDL 1.9 09/07/2021 0329   VLDL 9 09/07/2021 0329   LDLCALC 37 09/07/2021 0329   LDLCALC 59 06/07/2021 1621    Home Medications   No outpatient medications have been marked as taking for the 03/14/22 encounter (Appointment) with Elgie Collard, PA-C.     Review of Systems      All other systems reviewed and are otherwise negative except as noted above.  Physical Exam    VS:  There were no vitals taken for this visit. , BMI There is no height or weight on file to calculate BMI.  Wt Readings from Last 3 Encounters:  02/19/22 166 lb 12.8 oz (75.7 kg)  02/09/22 164 lb 12.8 oz (74.8 kg)  09/17/21 161 lb 6 oz (73.2 kg)     GEN: Well nourished, well developed, in no acute distress. HEENT: normal. Neck: Supple, no JVD, carotid bruits, or masses. Cardiac: irregularly irregular, no murmurs, rubs, or gallops. No clubbing, cyanosis, edema.  Radials/PT 2+ and equal bilaterally.  Respiratory:  Respirations regular and unlabored, clear to auscultation bilaterally. GI: Soft, nontender, nondistended. MS: No deformity or atrophy. Skin: Warm and dry, no rash. Neuro:  Strength and sensation are  intact. Psych: Normal affect.  Assessment & Plan   Nonischemic cardiomyopathy/combined systolic and diastolic HF -cath in [id number] with no CAD -unable to tolerate GDMT due to hypotension and ESRD -no indication for ischemic workup at the moment.  -most recent EF 35% -euvolemic on exam today  RV failure/ Pulm HTN -moderate RV dysfunction -On HD for volume  management  Atrial fibrillation -no symptoms, remains in Afib -unable to tolerate AV nodal blocking agents due to hypotension -Eliquis '5mg'$  BID -rate controlled today  ESRD -tues, thurs, sat -166.8 lbs, close to his "dry weight" -hemoglobin 11.0, dialysis will order labs often -he takes midodrine '10mg'$  BID on his dialysis days -will add midodrine '5mg'$  TID  on non dialysis days and asked him to track his BP  No BP recorded.  {Refresh Note OR Click here to enter BP  :1}***     Disposition: Follow up  3 months with Lauree Chandler, MD or APP.  Signed, Elgie Collard, PA-C 03/13/2022, 1:14 PM [facility]

## 2022-03-14 ENCOUNTER — Ambulatory Visit: Payer: Medicare Other | Admitting: Physician Assistant

## 2022-03-16 ENCOUNTER — Telehealth: Payer: Self-pay

## 2022-03-16 ENCOUNTER — Ambulatory Visit: Payer: Medicare Other | Attending: Nurse Practitioner

## 2022-03-16 NOTE — Telephone Encounter (Signed)
This nurse attempted to call patient three times for AWV. Patient does not have a voice mailbox set up. Unable to leave a message.

## 2022-03-19 ENCOUNTER — Other Ambulatory Visit: Payer: Self-pay | Admitting: Family Medicine

## 2022-03-19 DIAGNOSIS — J449 Chronic obstructive pulmonary disease, unspecified: Secondary | ICD-10-CM

## 2022-03-22 NOTE — Progress Notes (Deleted)
Office Visit    Patient Name: Nathaniel White Date of Encounter: 03/22/2022  PCP:  Nathaniel Pounds, NP   [place]  Cardiologist:  Nathaniel Chandler, MD  Advanced Practice Provider:  No care team member to display Electrophysiologist:  None   HPI     Nathaniel White is a 62 y.o. male with a past medical history significant for nonischemic cardiomyopathy with LVEF 30% (EF 40 to 45% then 35% on echocardiogram), HTN, HLD, PAD, ESRD on HD (T, T, S), failed renal transplant in 2010, COPD, DM 2, tobacco use, cerebrovascular accident with residual left-sided weakness, history of GI bleed, thrombocytopenia, and recent admission for acute on chronic systolic heart failure and new onset atrial fibrillation presents today for follow-up appointment.  The patient is a 62 year old man with a history of known nonischemic cardiomyopathy with cardiac cath 2015 without obstructive disease who was last seen in the clinic by Nathaniel White in 2020 but then was lost to follow-up.  He was recently admitted 3/2 through 03/15/2021.  He presented for revision of AV fistula with postop course complicated by hypotension and respiratory failure requiring intubation.  Episode thought to be due to vasoplegia in the setting of underlying ESRD.  TTE 03/13/2021 with LVEF 35% (from 40 to 45%), with moderate RV dysfunction, severe PHTN, moderate MR, RAP 15 mmHg.  Also noted to have new onset atrial fibrillation.  Seen by Dr. Johney White during this admission and she recommended considering a repeat ischemic evaluation as an outpatient.  Unable to tolerate GDMT due to hypotension and ESRD.  She cautiously started him on apixaban 2.5 mg twice a day given his history of GI bleeding and thrombocytopenia.  She mention consideration of watchman in the future if he can demonstrate compliance with follow-up.    He was last seen by the structural team on 03/24/2022 for follow-up.  He reported doing well since  leaving the hospital.  No lower extremity edema.  No chest pain or palpitations.  His aide reported that he had continuous blood in his stool for several months.  This has reportedly not gotten worse since starting Eliquis.  Patient was very reluctant to admit this and thinks that may be due to hemorrhoids.  He was seen by me 02/21/22, and was mainly worried about his low blood pressure.  He is a dialysis patient and takes midodrine on his dialysis days.  He takes 10 mg twice daily.  He stopped smoking and his blood pressure went down at that point.  He drinks about 64 ounces of water a day.  He states he feels sluggish.  Blood pressure 94/56 today.  He presented to the ED 02/24/2022 with a chief complaint of chest pain.  It usually occurred immediately when he wakes up and last for about 1 to 2 minutes then resolve spontaneously.  Without symptoms throughout the remainder of the day.  Dialysis T/TH/S.  No active chest pain at that time or shortness of breath.  No palpitations, weakness, dizziness, or recent injury.  EKG showed atrial fibrillation with premature ventricular or aberrantly conducted complexes.  Rate 74 bpm.  Troponin was ordered and showed 95, 92.  Recent values 75, 72 and 62 within the last 5 months, close to baseline.  Chest x-ray was also ordered which showed mild cardiomegaly, evidence of chronic bronchitis without acute cardiopulmonary finding.  Today, he ***  Past Medical History    Past Medical History:  Diagnosis Date   Anemia  Anxiety    Asthma    CHF (congestive heart failure) ([place])    Complication from renal dialysis device [id number]   Complication of anesthesia 2017   according to pt and spouse pt was moving around and cough while under and pt had difficulty waking up so the anesthesia had to be reversed. and pt admitted to ICU.    COPD (chronic obstructive pulmonary disease) (HCC)    Diabetes mellitus without complication ([place])    Type II - Patient states he does not  have diabetes, "they said sometimes when you start dialysis you don't have diabetes any longer"    ESOPHAGEAL VARICES 10/04/2008   Qualifier: Diagnosis of  By: Fuller Plan MD Lamont Snowball T    ESRD    on HD, T-TH-Sat - [place]   Hemiparesis due to old stroke [facility])    left   Hypertension    Hx, not current problems, no meds   LV dysfunction    EF 25-30% by echo 07/2011   Memory loss due to medical condition    due to stroke   MR (mitral regurgitation)    moderate to severe, echo 07/2011   Nausea with vomiting, unspecified 06/15/2021   Paroxysmal atrial fibrillation (HCC)    Peripheral vascular disease ([place])    Shortness of breath    with exertion   Stroke ([place])    TIA's-left sided weakness   Tobacco abuse    Past Surgical History:  Procedure Laterality Date   ABDOMINAL AORTOGRAM W/LOWER EXTREMITY Bilateral 07/21/2018   Procedure: ABDOMINAL AORTOGRAM W/LOWER EXTREMITY;  Surgeon: Waynetta Sandy, MD;  Location: [place] [facility];  Service: Cardiovascular;  Laterality: Bilateral;   AMPUTATION Left 11/05/2018   Procedure: AMPUTATION SECOND TOE LEFT FOOT;  Surgeon: Waynetta Sandy, MD;  Location: [place];  Service: Vascular;  Laterality: Left;   AMPUTATION Left 02/13/2021   Procedure: Left small finger AMPUTATION DIGIT;  Surgeon: Sherilyn Cooter, MD;  Location: [place];  Service: Orthopedics;  Laterality: Left;   AV FISTULA PLACEMENT     CARDIAC CATHETERIZATION     [facility]   COLONOSCOPY W/ BIOPSIES AND POLYPECTOMY     FISTULA SUPERFICIALIZATION Left 11/10/2013   Procedure: FISTULA PLICATION;  Surgeon: Serafina Mitchell, MD;  Location: [place];  Service: Vascular;  Laterality: Left;   INSERTION OF DIALYSIS CATHETER N/A 06/20/2021   Procedure: INSERTION OF DIALYSIS CATHETER;  Surgeon: Broadus John, MD;  Location: [facility] OR;  Service: Vascular;  Laterality: N/A;   KIDNEY TRANSPLANT     2011 rejected kidney 2012 back on dialysis   LEFT AND RIGHT HEART CATHETERIZATION  WITH CORONARY ANGIOGRAM N/A 10/09/2013   Procedure: LEFT AND RIGHT HEART CATHETERIZATION WITH CORONARY ANGIOGRAM;  Surgeon: Burnell Blanks, MD;  Location: [facility] CATH LAB;  Service: Cardiovascular;  Laterality: N/A;   LIGATION OF ARTERIOVENOUS  FISTULA Left 06/20/2021   Procedure: LIGATION OF ARTERIOVENOUS  FISTULA;  Surgeon: Broadus John, MD;  Location: [place];  Service: Vascular;  Laterality: Left;   PERIPHERAL VASCULAR INTERVENTION Left 07/21/2018   Procedure: PERIPHERAL VASCULAR INTERVENTION;  Surgeon: Waynetta Sandy, MD;  Location: [place] [facility];  Service: Cardiovascular;  Laterality: Left;  Popliteal   REVISON OF ARTERIOVENOUS FISTULA Left 08/26/2013   Procedure: EXCISION OF ERODED SKIN AND EXPLORATION OF MAIN LEFT UPPER ARM AV FISTULA;  Surgeon: Rosetta Posner, MD;  Location: [place];  Service: Vascular;  Laterality: Left;   REVISON OF ARTERIOVENOUS FISTULA Left 02/05/2014  Procedure: REPAIR OF ARTERIOVENOUS FISTULA ANEURYSM;  Surgeon: Serafina Mitchell, MD;  Location: [facility] OR;  Service: Vascular;  Laterality: Left;   REVISON OF ARTERIOVENOUS FISTULA Left 03/10/2021   Procedure: LEFT ARM REVISION OF ARTERIOVENOUS FISTULA WITH PLICATION;  Surgeon: Waynetta Sandy, MD;  Location: [place];  Service: Vascular;  Laterality: Left;   SHUNTOGRAM N/A 11/05/2012   Procedure: Fistulogram;  Surgeon: Serafina Mitchell, MD;  Location: [facility] CATH LAB;  Service: Cardiovascular;  Laterality: N/A;   TEE WITHOUT CARDIOVERSION  08/17/2011   Procedure: TRANSESOPHAGEAL ECHOCARDIOGRAM (TEE);  Surgeon: Larey Dresser, MD;  Location: [place];  Service: Cardiovascular;  Laterality: N/A;   ULTRASOUND GUIDANCE FOR VASCULAR ACCESS  06/20/2021   Procedure: ULTRASOUND GUIDANCE FOR VASCULAR ACCESS;  Surgeon: Broadus John, MD;  Location: [facility] OR;  Service: Vascular;;   VIDEO ASSISTED THORACOSCOPY (VATS)/DECORTICATION  08/10/11    Allergies  Allergies  Allergen Reactions   Codeine Shortness Of Breath    Dextromethorphan-Guaifenesin Shortness Of Breath   Iron Dextran Shortness Of Breath   Morphine And Related Shortness Of Breath    EKGs/Labs/Other Studies Reviewed:   The following studies were reviewed today:  Echo 09/05/21 IMPRESSIONS     1. Left ventricular ejection fraction, by estimation, is 30 to 35%. The  left ventricle has moderately decreased function. The left ventricle  demonstrates global hypokinesis. There is severe concentric left  ventricular hypertrophy. Left ventricular  diastolic parameters are indeterminate.   2. Right ventricular systolic function is moderately reduced. The right  ventricular size is severely enlarged. There is severely elevated  pulmonary artery systolic pressure. The estimated right ventricular  systolic pressure is 123XX123 mmHg.   3. Left atrial size was severely dilated.   4. Right atrial size was mildly dilated.   5. Regurgitant volume 42 ml, No right sided pulmonary vein systolic  blunting. There is a mixed etiology of mitral regurgitation: posterior  restriction and functional MR. Calcified mitral valve with at least mild  mitral regurgitation. The mitral valve is   degenerative. Moderate to severe mitral valve regurgitation. Mild mitral  stenosis. The mean mitral valve gradient is 5.0 mmHg with average heart  rate of 79 bpm. Moderate mitral annular calcification.   6. Tricuspid valve regurgitation is moderate to severe.   7. The aortic valve is tricuspid. There is mild calcification of the  aortic valve. There is mild thickening of the aortic valve. Aortic valve  regurgitation is not visualized. Aortic valve sclerosis is present, with  no evidence of aortic valve stenosis.   Comparison(s): Prior images reviewed side by side. Increase in mitral  valve and tricuspid valve regurgitation from prior. When patient is at dry  weight, consideration of TEE for characterization of MR/MS is reasonable  (inpatient vs outpatient).   FINDINGS    Left Ventricle: Left ventricular ejection fraction, by estimation, is 30  to 35%. The left ventricle has moderately decreased function. The left  ventricle demonstrates global hypokinesis. Definity contrast agent was  given IV to delineate the left  ventricular endocardial borders. The left ventricular internal cavity size  was normal in size. There is severe concentric left ventricular  hypertrophy. Left ventricular diastolic parameters are indeterminate.   Right Ventricle: The right ventricular size is severely enlarged. No  increase in right ventricular wall thickness. Right ventricular systolic  function is moderately reduced. There is severely elevated pulmonary  artery systolic pressure. The tricuspid  regurgitant velocity is 3.69 m/s, and with an assumed right atrial  pressure of 8 mmHg, the estimated right ventricular systolic pressure is  123XX123 mmHg.   Left Atrium: Left atrial size was severely dilated.   Right Atrium: Right atrial size was mildly dilated.   Pericardium: There is no evidence of pericardial effusion.   Mitral Valve: Regurgitant volume 42 ml, No right sided pulmonary vein  systolic blunting. There is a mixed etiology of mitral regurgitation:  posterior restriction and functional MR. Calcified mitral valve with at  least mild mitral regurgitation. The  mitral valve is degenerative in appearance. Moderate mitral annular  calcification. Moderate to severe mitral valve regurgitation. Mild mitral  valve stenosis. The mean mitral valve gradient is 5.0 mmHg with average  heart rate of 79 bpm.   Tricuspid Valve: The tricuspid valve is normal in structure. Tricuspid  valve regurgitation is moderate to severe.   Aortic Valve: The aortic valve is tricuspid. There is mild calcification  of the aortic valve. There is mild thickening of the aortic valve. There  is moderate aortic valve annular calcification. Aortic valve regurgitation  is not visualized. Aortic valve   sclerosis is present, with no evidence of aortic valve stenosis. Aortic  valve mean gradient measures 2.0 mmHg. Aortic valve peak gradient measures  4.5 mmHg. Aortic valve area, by VTI measures 1.47 cm.   Pulmonic Valve: The pulmonic valve was normal in structure. Pulmonic valve  regurgitation is trivial. No evidence of pulmonic stenosis.   Aorta: The aortic root is normal in size and structure.    EKG:  EKG is not ordered today.  Recent Labs: 09/13/2021: B Natriuretic Peptide 3,508.3 09/14/2021: ALT 9; Magnesium 1.9 02/24/2022: BUN 31; Creatinine, Ser 7.28; Hemoglobin 15.2; Platelets 77; Potassium 4.0; Sodium 133  Recent Lipid Panel    Component Value Date/Time   CHOL 97 09/07/2021 0329   CHOL 130 06/07/2021 1621   TRIG 44 09/07/2021 0329   HDL 51 09/07/2021 0329   HDL 58 06/07/2021 1621   CHOLHDL 1.9 09/07/2021 0329   VLDL 9 09/07/2021 0329   LDLCALC 37 09/07/2021 0329   LDLCALC 59 06/07/2021 1621    Home Medications   No outpatient medications have been marked as taking for the 03/23/22 encounter (Appointment) with Elgie Collard, PA-C.     Review of Systems      All other systems reviewed and are otherwise negative except as noted above.  Physical Exam    VS:  There were no vitals taken for this visit. , BMI There is no height or weight on file to calculate BMI.  Wt Readings from Last 3 Encounters:  02/19/22 166 lb 12.8 oz (75.7 kg)  02/09/22 164 lb 12.8 oz (74.8 kg)  09/17/21 161 lb 6 oz (73.2 kg)     GEN: Well nourished, well developed, in no acute distress. HEENT: normal. Neck: Supple, no JVD, carotid bruits, or masses. Cardiac: irregularly irregular, no murmurs, rubs, or gallops. No clubbing, cyanosis, edema.  Radials/PT 2+ and equal bilaterally.  Respiratory:  Respirations regular and unlabored, clear to auscultation bilaterally. GI: Soft, nontender, nondistended. MS: No deformity or atrophy. Skin: Warm and dry, no rash. Neuro:  Strength and sensation  are intact. Psych: Normal affect.  Assessment & Plan   Nonischemic cardiomyopathy/combined systolic and diastolic HF -cath in [id number] with no CAD -unable to tolerate GDMT due to hypotension and ESRD -no indication for ischemic workup at the moment.  -most recent EF 35% -euvolemic on exam today  RV failure/ Pulm HTN -moderate RV dysfunction -On HD for volume  management  Atrial fibrillation -no symptoms, remains in Afib -unable to tolerate AV nodal blocking agents due to hypotension -Eliquis '5mg'$  BID -rate controlled today  ESRD -tues, thurs, sat -166.8 lbs, close to his "dry weight" -hemoglobin 11.0, dialysis will order labs often -he takes midodrine '10mg'$  BID on his dialysis days -will add midodrine '5mg'$  TID  on non dialysis days and asked him to track his BP  No BP recorded.  {Refresh Note OR Click here to enter BP  :1}***     Disposition: Follow up  3 months with Nathaniel Chandler, MD or APP.  Signed, Elgie Collard, PA-C 03/22/2022, 1:16 PM [facility]

## 2022-03-23 ENCOUNTER — Ambulatory Visit: Payer: Medicare Other | Attending: Nurse Practitioner | Admitting: Physician Assistant

## 2022-03-23 DIAGNOSIS — I272 Pulmonary hypertension, unspecified: Secondary | ICD-10-CM

## 2022-03-23 DIAGNOSIS — I50810 Right heart failure, unspecified: Secondary | ICD-10-CM

## 2022-03-23 DIAGNOSIS — N186 End stage renal disease: Secondary | ICD-10-CM

## 2022-03-23 DIAGNOSIS — I428 Other cardiomyopathies: Secondary | ICD-10-CM

## 2022-03-23 DIAGNOSIS — I4891 Unspecified atrial fibrillation: Secondary | ICD-10-CM

## 2022-05-11 ENCOUNTER — Encounter: Payer: Self-pay | Admitting: Nurse Practitioner

## 2022-05-11 ENCOUNTER — Ambulatory Visit: Payer: Medicare Other | Attending: Nurse Practitioner | Admitting: Nurse Practitioner

## 2022-05-11 VITALS — BP 104/64 | HR 102 | Ht 69.0 in | Wt 164.6 lb

## 2022-05-11 DIAGNOSIS — N529 Male erectile dysfunction, unspecified: Secondary | ICD-10-CM

## 2022-05-11 DIAGNOSIS — Z23 Encounter for immunization: Secondary | ICD-10-CM

## 2022-05-11 DIAGNOSIS — I10 Essential (primary) hypertension: Secondary | ICD-10-CM

## 2022-05-11 MED ORDER — SILDENAFIL CITRATE 100 MG PO TABS: 50.0000 mg | ORAL_TABLET | Freq: Every day | ORAL | 11 refills | Status: DC | PRN

## 2022-05-11 MED ORDER — ZOSTER VAC RECOMB ADJUVANTED 50 MCG/0.5ML IM SUSR: 0.5000 mL | Freq: Once | INTRAMUSCULAR | 0 refills | Status: AC

## 2022-05-11 NOTE — Patient Instructions (Signed)
[facility] [address] [place], [place] [address] Tel: [phone] Fax: [phone]

## 2022-05-11 NOTE — Progress Notes (Unsigned)
Assessment & Plan:  Nathaniel White was seen today for hypertension.  Diagnoses and all orders for this visit:  Primary hypertension Well controlled.   Need for Tdap vaccination -     Tdap vaccine greater than or equal to 60yo IM  Need for shingles vaccine -     Zoster Vaccine Adjuvanted [facility]) injection; Inject 0.5 mLs into the muscle once for 1 dose.  Other orders -     sildenafil (VIAGRA) 100 MG tablet; Take 0.5-1 tablets (50-100 mg total) by mouth daily as needed for erectile dysfunction.    Patient has been counseled on age-appropriate routine health concerns for screening and prevention. These are reviewed and up-to-date. Referrals have been placed accordingly. Immunizations are up-to-date or declined.    Subjective:   Chief Complaint  Patient presents with   Hypertension   HPI Nathaniel White 62 y.o. male presents to office today for follow up to HTN  He has a past medical history of Anemia, Afib (taking Eliquis), Thrombocytopenia, Anxiety, Asthma, CHF, Complication from renal dialysis device (11/02/2013),  COPD, DM 2,  ESOPHAGEAL VARICES (10/04/2008), ESRD, Failed renal transplant in 2010, Left sided Hemiparesis due to old stroke, Hypertension, LV dysfunction, Memory loss due to medical condition, MR, Peripheral vascular disease, NICM with LVEF 30-35%,   Using a wheelchair due to decreased mobility, shortness of breath with exertion and history of stroke with residual left sided weakness and LE edema. He is requesting a rolling walker to assist with mobility.   HTN Blood pressure is well controlled.  BP Readings from Last 3 Encounters:  05/11/22 104/64  02/24/22 (!) 109/57  02/19/22 (!) 94/56    Erectile Dysfunction: Patient complains of erectile dysfunction.  Onset of dysfunction was several months ago and was gradual in onset.  Patient states the nature of difficulty is both attaining and maintaining erection. Full erections occur rarely. Partial erections occur with  intercourse. Libido is not affected. Risk factors for ED include cardiovascular disease and neurologic disease (history of CVA). Patient denies history of diabetes mellitus and cranial, spinal, or pelvic trauma. Patient's expectations as to sexual function maintain and achieve an erection.  Patient's description of relationship w/partner good.  Previous treatment of ED includes NONE.    Review of Systems  Constitutional:  Negative for fever, malaise/fatigue and weight loss.  HENT: Negative.  Negative for nosebleeds.   Eyes: Negative.  Negative for blurred vision, double vision and photophobia.  Respiratory: Negative.  Negative for cough and shortness of breath.   Cardiovascular: Negative.  Negative for chest pain, palpitations and leg swelling.  Gastrointestinal: Negative.  Negative for heartburn, nausea and vomiting.  Genitourinary:        ED  Musculoskeletal: Negative.  Negative for myalgias.  Neurological: Negative.  Negative for dizziness, focal weakness, seizures and headaches.  Psychiatric/Behavioral: Negative.  Negative for suicidal ideas.     Past Medical History:  Diagnosis Date   Anemia    Anxiety    Asthma    CHF (congestive heart failure) (HCC)    Complication from renal dialysis device 11/02/2013   Complication of anesthesia 2017   according to pt and spouse pt was moving around and cough while under and pt had difficulty waking up so the anesthesia had to be reversed. and pt admitted to ICU.    COPD (chronic obstructive pulmonary disease) (HCC)    Diabetes mellitus without complication (HCC)    Type II - Patient states he does not have diabetes, "they said sometimes when  you start dialysis you don't have diabetes any longer"    ESOPHAGEAL VARICES 10/04/2008   Qualifier: Diagnosis of  By: Russella Dar MD Bronson Curb T    ESRD    on HD, [facility] - [place]   Hemiparesis due to old stroke [facility])    left   Hypertension    Hx, not current problems, no meds   LV dysfunction     EF 25-30% by echo 07/2011   Memory loss due to medical condition    due to stroke   MR (mitral regurgitation)    moderate to severe, echo 07/2011   Nausea with vomiting, unspecified 06/15/2021   Paroxysmal atrial fibrillation (HCC)    Peripheral vascular disease (HCC)    Shortness of breath    with exertion   Stroke (HCC)    TIA's-left sided weakness   Tobacco abuse     Past Surgical History:  Procedure Laterality Date   ABDOMINAL AORTOGRAM W/LOWER EXTREMITY Bilateral 07/21/2018   Procedure: ABDOMINAL AORTOGRAM W/LOWER EXTREMITY;  Surgeon: Maeola Harman, MD;  Location: [facility] INVASIVE CV LAB;  Service: Cardiovascular;  Laterality: Bilateral;   AMPUTATION Left 11/05/2018   Procedure: AMPUTATION SECOND TOE LEFT FOOT;  Surgeon: Maeola Harman, MD;  Location: [facility] OR;  Service: Vascular;  Laterality: Left;   AMPUTATION Left 02/13/2021   Procedure: Left small finger AMPUTATION DIGIT;  Surgeon: Marlyne Beards, MD;  Location: [facility] OR;  Service: Orthopedics;  Laterality: Left;   AV FISTULA PLACEMENT     CARDIAC CATHETERIZATION     [facility]   COLONOSCOPY W/ BIOPSIES AND POLYPECTOMY     FISTULA SUPERFICIALIZATION Left 11/10/2013   Procedure: FISTULA PLICATION;  Surgeon: Nada Libman, MD;  Location: [facility] OR;  Service: Vascular;  Laterality: Left;   INSERTION OF DIALYSIS CATHETER N/A 06/20/2021   Procedure: INSERTION OF DIALYSIS CATHETER;  Surgeon: Victorino Sparrow, MD;  Location: [facility] OR;  Service: Vascular;  Laterality: N/A;   KIDNEY TRANSPLANT     2011 rejected kidney 2012 back on dialysis   LEFT AND RIGHT HEART CATHETERIZATION WITH CORONARY ANGIOGRAM N/A 10/09/2013   Procedure: LEFT AND RIGHT HEART CATHETERIZATION WITH CORONARY ANGIOGRAM;  Surgeon: Kathleene Hazel, MD;  Location: [facility] CATH LAB;  Service: Cardiovascular;  Laterality: N/A;   LIGATION OF ARTERIOVENOUS  FISTULA Left 06/20/2021   Procedure: LIGATION OF ARTERIOVENOUS  FISTULA;  Surgeon: Victorino Sparrow,  MD;  Location: [facility] OR;  Service: Vascular;  Laterality: Left;   PERIPHERAL VASCULAR INTERVENTION Left 07/21/2018   Procedure: PERIPHERAL VASCULAR INTERVENTION;  Surgeon: Maeola Harman, MD;  Location: [facility] INVASIVE CV LAB;  Service: Cardiovascular;  Laterality: Left;  Popliteal   REVISON OF ARTERIOVENOUS FISTULA Left 08/26/2013   Procedure: EXCISION OF ERODED SKIN AND EXPLORATION OF MAIN LEFT UPPER ARM AV FISTULA;  Surgeon: Larina Earthly, MD;  Location: [facility] OR;  Service: Vascular;  Laterality: Left;   REVISON OF ARTERIOVENOUS FISTULA Left 02/05/2014   Procedure: REPAIR OF ARTERIOVENOUS FISTULA ANEURYSM;  Surgeon: Nada Libman, MD;  Location: [facility] OR;  Service: Vascular;  Laterality: Left;   REVISON OF ARTERIOVENOUS FISTULA Left 03/10/2021   Procedure: LEFT ARM REVISION OF ARTERIOVENOUS FISTULA WITH PLICATION;  Surgeon: Maeola Harman, MD;  Location: [facility] OR;  Service: Vascular;  Laterality: Left;   SHUNTOGRAM N/A 11/05/2012   Procedure: Fistulogram;  Surgeon: Nada Libman, MD;  Location: [facility] CATH LAB;  Service: Cardiovascular;  Laterality: N/A;   TEE WITHOUT CARDIOVERSION  08/17/2011   Procedure: TRANSESOPHAGEAL  ECHOCARDIOGRAM (TEE);  Surgeon: Laurey Morale, MD;  Location: [facility] ENDOSCOPY;  Service: Cardiovascular;  Laterality: N/A;   ULTRASOUND GUIDANCE FOR VASCULAR ACCESS  06/20/2021   Procedure: ULTRASOUND GUIDANCE FOR VASCULAR ACCESS;  Surgeon: Victorino Sparrow, MD;  Location: [facility] OR;  Service: Vascular;;   VIDEO ASSISTED THORACOSCOPY (VATS)/DECORTICATION  08/10/11    Family History  Problem Relation Age of Onset   Hypertension Mother    Varicose Veins Mother    Diabetes Paternal Grandmother    Cancer Paternal Grandfather    CAD Paternal Uncle     Social History Reviewed with no changes to be made today.   Outpatient Medications Prior to Visit  Medication Sig Dispense Refill   acetaminophen (TYLENOL) 500 MG tablet Take 1 tablet (500 mg total) by mouth every 6 (six) hours as  needed. (Patient taking differently: Take 500-1,000 mg by mouth 2 (two) times daily as needed for headache.) 30 tablet 0   albuterol (PROAIR HFA) 108 (90 Base) MCG/ACT inhaler Inhale 2 puffs into the lungs every 6 (six) hours as needed for wheezing or shortness of breath. 18 g 2   albuterol (PROVENTIL) (2.5 MG/3ML) 0.083% nebulizer solution Take 3 mLs (2.5 mg total) by nebulization every 6 (six) hours as needed for wheezing or shortness of breath. 150 mL 1   ELIQUIS 5 MG TABS tablet Take 5 mg by mouth 2 (two) times daily.     Ferrous Sulfate (IRON PO) Take 1 tablet by mouth in the morning, at noon, and at bedtime.     fluticasone-salmeterol (ADVAIR DISKUS) 250-50 MCG/ACT AEPB Inhale 1 puff into the lungs in the morning and at bedtime. NEEDS PASS 180 each 6   hydrOXYzine (ATARAX) 10 MG tablet Take 1 tablet (10 mg total) by mouth 3 (three) times daily as needed. For anxiety (Patient taking differently: Take 10 mg by mouth 2 (two) times daily as needed for anxiety.) 60 tablet 0   midodrine (PROAMATINE) 5 MG tablet Take one tablet by mouth 3 times daily on non-dialysis days, Mon, Wed, Fri and Sun. 180 tablet 3   montelukast (SINGULAIR) 10 MG tablet Take 1 tablet (10 mg total) by mouth at bedtime. 90 tablet 3   traZODone (DESYREL) 50 MG tablet Take 1 tablet (50 mg total) by mouth at bedtime as needed for sleep. 30 tablet 0   VELPHORO 500 MG chewable tablet Chew 1,000 mg by mouth 2 (two) times daily after a meal.     VISINE 0.05 % ophthalmic solution Place 2-3 drops into both eyes 2 (two) times daily as needed (dry eye).     No facility-administered medications prior to visit.    Allergies  Allergen Reactions   Codeine Shortness Of Breath   Dextromethorphan-Guaifenesin Shortness Of Breath and Other (See Comments)   Iron Dextran Shortness Of Breath and Other (See Comments)   Morphine And Related Shortness Of Breath    Other Reaction(s): Other (See Comments)       Objective:    BP 104/64 (BP  Location: Right Arm, Patient Position: Sitting, Cuff Size: Normal)   Pulse (!) 102   Ht 5\' 9"  (1.753 m)   Wt 164 lb 9.6 oz (74.7 kg)   SpO2 99%   BMI 24.31 kg/m  Wt Readings from Last 3 Encounters:  05/11/22 164 lb 9.6 oz (74.7 kg)  02/19/22 166 lb 12.8 oz (75.7 kg)  02/09/22 164 lb 12.8 oz (74.8 kg)    Physical Exam Vitals and nursing note reviewed.  Constitutional:  Appearance: He is well-developed.  HENT:     Head: Normocephalic and atraumatic.  Cardiovascular:     Rate and Rhythm: Normal rate and regular rhythm.     Heart sounds: Normal heart sounds. No murmur heard.    No friction rub. No gallop.  Pulmonary:     Effort: Pulmonary effort is normal. No tachypnea or respiratory distress.     Breath sounds: Normal breath sounds. No decreased breath sounds, wheezing, rhonchi or rales.  Chest:     Chest wall: No tenderness.  Abdominal:     General: Bowel sounds are normal.     Palpations: Abdomen is soft.  Musculoskeletal:        General: Normal range of motion.     Cervical back: Normal range of motion.  Skin:    General: Skin is warm and dry.  Neurological:     Mental Status: He is alert and oriented to person, place, and time.     Coordination: Coordination normal.  Psychiatric:        Behavior: Behavior normal. Behavior is cooperative.        Thought Content: Thought content normal.        Judgment: Judgment normal.          Patient has been counseled extensively about nutrition and exercise as well as the importance of adherence with medications and regular follow-up. The patient was given clear instructions to go to ER or return to medical center if symptoms don't improve, worsen or new problems develop. The patient verbalized understanding.   Follow-up: Return in about 3 months (around 08/11/2022).   Claiborne Rigg, FNP-BC [facility] [place], [place] [phone]   05/14/2022, 1:05 PM

## 2022-05-14 ENCOUNTER — Telehealth: Payer: Self-pay | Admitting: Nurse Practitioner

## 2022-05-14 ENCOUNTER — Encounter: Payer: Self-pay | Admitting: Nurse Practitioner

## 2022-05-14 NOTE — Telephone Encounter (Signed)
Contacted [name] to schedule their annual wellness visit. Appointment made for 05/15/2021.  Thank you,  [facility] Direct dial  [phone]

## 2022-05-16 ENCOUNTER — Ambulatory Visit: Payer: Medicare Other

## 2022-06-12 ENCOUNTER — Emergency Department (HOSPITAL_COMMUNITY): Payer: Medicare Other

## 2022-06-12 ENCOUNTER — Other Ambulatory Visit: Payer: Self-pay

## 2022-06-12 ENCOUNTER — Encounter (HOSPITAL_COMMUNITY): Payer: Self-pay

## 2022-06-12 ENCOUNTER — Emergency Department (HOSPITAL_COMMUNITY)
Admission: EM | Admit: 2022-06-12 | Discharge: 2022-06-12 | Disposition: A | Discharge status: Home / Self Care | Payer: Medicare Other | Attending: Emergency Medicine | Admitting: Emergency Medicine

## 2022-06-12 DIAGNOSIS — Z992 Dependence on renal dialysis: Secondary | ICD-10-CM | POA: Diagnosis not present

## 2022-06-12 DIAGNOSIS — N186 End stage renal disease: Secondary | ICD-10-CM | POA: Diagnosis not present

## 2022-06-12 DIAGNOSIS — Z7901 Long term (current) use of anticoagulants: Secondary | ICD-10-CM | POA: Diagnosis not present

## 2022-06-12 DIAGNOSIS — E1122 Type 2 diabetes mellitus with diabetic chronic kidney disease: Secondary | ICD-10-CM | POA: Insufficient documentation

## 2022-06-12 DIAGNOSIS — I132 Hypertensive heart and chronic kidney disease with heart failure and with stage 5 chronic kidney disease, or end stage renal disease: Secondary | ICD-10-CM | POA: Diagnosis not present

## 2022-06-12 DIAGNOSIS — F17200 Nicotine dependence, unspecified, uncomplicated: Secondary | ICD-10-CM | POA: Insufficient documentation

## 2022-06-12 DIAGNOSIS — J449 Chronic obstructive pulmonary disease, unspecified: Secondary | ICD-10-CM | POA: Insufficient documentation

## 2022-06-12 DIAGNOSIS — I509 Heart failure, unspecified: Secondary | ICD-10-CM | POA: Insufficient documentation

## 2022-06-12 DIAGNOSIS — R55 Syncope and collapse: Secondary | ICD-10-CM | POA: Diagnosis present

## 2022-06-12 LAB — BASIC METABOLIC PANEL
Anion gap: UNDETERMINED (ref 5–15)
Anion gap: 19 — ABNORMAL HIGH (ref 5–15)
BUN: UNDETERMINED mg/dL (ref 6–20)
BUN: 50 mg/dL — ABNORMAL HIGH (ref 6–20)
CO2: UNDETERMINED mmol/L (ref 22–32)
CO2: 23 mmol/L (ref 22–32)
Calcium: UNDETERMINED mg/dL (ref 8.9–10.3)
Calcium: 9.3 mg/dL (ref 8.9–10.3)
Chloride: UNDETERMINED mmol/L (ref 98–111)
Chloride: 94 mmol/L — ABNORMAL LOW (ref 98–111)
Creatinine, Ser: UNDETERMINED mg/dL (ref 0.61–1.24)
Creatinine, Ser: 10.32 mg/dL — ABNORMAL HIGH (ref 0.61–1.24)
GFR, Estimated: UNDETERMINED mL/min (ref >60)
GFR, Estimated: 5 mL/min — ABNORMAL LOW (ref >60)
Glucose, Bld: UNDETERMINED mg/dL (ref 70–99)
Glucose, Bld: 104 mg/dL — ABNORMAL HIGH (ref 70–99)
Potassium: UNDETERMINED mmol/L (ref 3.5–5.1)
Potassium: 4.5 mmol/L (ref 3.5–5.1)
Sodium: UNDETERMINED mmol/L (ref 135–145)
Sodium: 136 mmol/L (ref 135–145)

## 2022-06-12 LAB — CBC
HCT: 51 % (ref 39.0–52.0)
Hemoglobin: 16.1 g/dL (ref 13.0–17.0)
MCH: 30 pg (ref 26.0–34.0)
MCHC: 31.6 g/dL (ref 30.0–36.0)
MCV: 95 fL (ref 80.0–100.0)
Platelets: 84 10*3/uL — ABNORMAL LOW (ref 150–400)
RBC: 5.37 MIL/uL (ref 4.22–5.81)
RDW: 17.2 % — ABNORMAL HIGH (ref 11.5–15.5)
WBC: 5.9 10*3/uL (ref 4.0–10.5)
nRBC: 0 % (ref 0.0–0.2)

## 2022-06-12 LAB — BRAIN NATRIURETIC PEPTIDE: B Natriuretic Peptide: 1479.2 pg/mL — ABNORMAL HIGH (ref 0.0–100.0)

## 2022-06-12 LAB — TROPONIN I (HIGH SENSITIVITY)
Troponin I (High Sensitivity): 89 ng/L — ABNORMAL HIGH (ref <18)
Troponin I (High Sensitivity): 92 ng/L — ABNORMAL HIGH (ref <18)

## 2022-06-12 NOTE — ED Provider Triage Note (Signed)
Emergency Medicine Provider Triage Evaluation Note  Nathaniel White , a 62 y.o. male  was evaluated in triage.  Pt complains of sensation of near syncope for the past hour.  Denies any chest pain but endorses some shortness of breath.  Had full dialysis session today.  Has not missed any recent sessions.  Reports compliance with home medications.  No other complaints..  Review of Systems  Positive: As above Negative: As above  Physical Exam  BP 126/82 (BP Location: Right Arm)   Pulse 82   Temp 98.2 F (36.8 C) (Oral)   Resp 20   Ht 5\' 9"  (1.753 m)   Wt 74.7 kg   SpO2 96%   BMI 24.32 kg/m  Gen:   Awake, no distress  Resp:  Normal effort  MSK:   Moves extremities without difficulty  Other:    Medical Decision Making  Medically screening exam initiated at 2:02 PM.  Appropriate orders placed.  TRISTAIN GOTTSHALL was informed that the remainder of the evaluation will be completed by another provider, this initial triage assessment does not replace that evaluation, and the importance of remaining in the ED until their evaluation is complete.     Marita Kansas, PA-C 06/12/22 [phone]

## 2022-06-12 NOTE — Discharge Instructions (Addendum)
I recommend close follow-up with PCP for reevaluation.  Please do not hesitate to return to emergency department if worrisome signs symptoms we discussed become apparent.  

## 2022-06-12 NOTE — ED Provider Notes (Signed)
[place] EMERGENCY DEPARTMENT AT [facility] Provider Note   CSN: [id number] Arrival date & time: 06/12/22  1316     History {Add pertinent medical, surgical, social history, OB history to HPI:1} Chief Complaint  Patient presents with   Shortness of Breath    SHRIHAN DICOLA is a 62 y.o. male with a past medical history of ESRD on dialysis TueThuSat, anemia, CHF, COPD, diabetes, TIA presents today for evaluation of near syncope.  Patient stated as after finishing his hemodialysis session this morning he was waiting in the waiting room when he felt lightheadedness and felt like he was going to pass out.  Patient states 3 L of fluid was removed this morning.  He denies any chest pain, shortness of breath, nausea, vomiting, bowel changes, blood in stool.  Last echo was on 09/05/2021 showed left EF of 30 to 35%.   Shortness of Breath     Past Medical History:  Diagnosis Date   Anemia    Anxiety    Asthma    CHF (congestive heart failure) (HCC)    Complication from renal dialysis device 11/02/2013   Complication of anesthesia 2017   according to pt and spouse pt was moving around and cough while under and pt had difficulty waking up so the anesthesia had to be reversed. and pt admitted to ICU.    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/2008   Qualifier: Diagnosis of  By: Russella Dar MD Bronson Curb T    ESRD    on HD, T-TH-Sat - [place]   Hemiparesis due to old stroke [facility])    left   Hypertension    Hx, not current problems, no meds   LV dysfunction    EF 25-30% by echo 07/2011   Memory loss due to medical condition    due to stroke   MR (mitral regurgitation)    moderate to severe, echo 07/2011   Nausea with vomiting, unspecified 06/15/2021    Paroxysmal atrial fibrillation (HCC)    Peripheral vascular disease (HCC)    Shortness of breath    with exertion   Stroke (HCC)    TIA's-left sided weakness   Tobacco abuse    Past Surgical History:  Procedure Laterality Date   ABDOMINAL AORTOGRAM W/LOWER EXTREMITY Bilateral 07/21/2018   Procedure: ABDOMINAL AORTOGRAM W/LOWER EXTREMITY;  Surgeon: Maeola Harman, MD;  Location: [facility] INVASIVE CV LAB;  Service: Cardiovascular;  Laterality: Bilateral;   AMPUTATION Left 11/05/2018   Procedure: AMPUTATION SECOND TOE LEFT FOOT;  Surgeon: Maeola Harman, MD;  Location: [facility] OR;  Service: Vascular;  Laterality: Left;   AMPUTATION Left 02/13/2021   Procedure: Left small finger AMPUTATION DIGIT;  Surgeon: Marlyne Beards, MD;  Location: [facility] OR;  Service: Orthopedics;  Laterality: Left;   AV FISTULA PLACEMENT     CARDIAC CATHETERIZATION     [facility]   COLONOSCOPY W/ BIOPSIES AND POLYPECTOMY     FISTULA SUPERFICIALIZATION Left 11/10/2013   Procedure: FISTULA PLICATION;  Surgeon: Nada Libman, MD;  Location: [facility] OR;  Service: Vascular;  Laterality: Left;   INSERTION OF DIALYSIS CATHETER N/A 06/20/2021   Procedure: INSERTION OF DIALYSIS CATHETER;  Surgeon: Victorino Sparrow, MD;  Location: [facility] OR;  Service: Vascular;  Laterality: N/A;  KIDNEY TRANSPLANT     2011 rejected kidney 2012 back on dialysis   LEFT AND RIGHT HEART CATHETERIZATION WITH CORONARY ANGIOGRAM N/A 10/09/2013   Procedure: LEFT AND RIGHT HEART CATHETERIZATION WITH CORONARY ANGIOGRAM;  Surgeon: Kathleene Hazel, MD;  Location: [facility] CATH LAB;  Service: Cardiovascular;  Laterality: N/A;   LIGATION OF ARTERIOVENOUS  FISTULA Left 06/20/2021   Procedure: LIGATION OF ARTERIOVENOUS  FISTULA;  Surgeon: Victorino Sparrow, MD;  Location: [facility] OR;  Service: Vascular;  Laterality: Left;   PERIPHERAL VASCULAR INTERVENTION Left 07/21/2018   Procedure: PERIPHERAL VASCULAR INTERVENTION;  Surgeon: Maeola Harman, MD;   Location: [facility] INVASIVE CV LAB;  Service: Cardiovascular;  Laterality: Left;  Popliteal   REVISON OF ARTERIOVENOUS FISTULA Left 08/26/2013   Procedure: EXCISION OF ERODED SKIN AND EXPLORATION OF MAIN LEFT UPPER ARM AV FISTULA;  Surgeon: Larina Earthly, MD;  Location: [facility] OR;  Service: Vascular;  Laterality: Left;   REVISON OF ARTERIOVENOUS FISTULA Left 02/05/2014   Procedure: REPAIR OF ARTERIOVENOUS FISTULA ANEURYSM;  Surgeon: Nada Libman, MD;  Location: [facility] OR;  Service: Vascular;  Laterality: Left;   REVISON OF ARTERIOVENOUS FISTULA Left 03/10/2021   Procedure: LEFT ARM REVISION OF ARTERIOVENOUS FISTULA WITH PLICATION;  Surgeon: Maeola Harman, MD;  Location: [facility] OR;  Service: Vascular;  Laterality: Left;   SHUNTOGRAM N/A 11/05/2012   Procedure: Fistulogram;  Surgeon: Nada Libman, MD;  Location: [facility] CATH LAB;  Service: Cardiovascular;  Laterality: N/A;   TEE WITHOUT CARDIOVERSION  08/17/2011   Procedure: TRANSESOPHAGEAL ECHOCARDIOGRAM (TEE);  Surgeon: Laurey Morale, MD;  Location: [facility] ENDOSCOPY;  Service: Cardiovascular;  Laterality: N/A;   ULTRASOUND GUIDANCE FOR VASCULAR ACCESS  06/20/2021   Procedure: ULTRASOUND GUIDANCE FOR VASCULAR ACCESS;  Surgeon: Victorino Sparrow, MD;  Location: [facility] OR;  Service: Vascular;;   VIDEO ASSISTED THORACOSCOPY (VATS)/DECORTICATION  08/10/11     Home Medications Prior to Admission medications   Medication Sig Start Date End Date Taking? Authorizing Provider  acetaminophen (TYLENOL) 500 MG tablet Take 1 tablet (500 mg total) by mouth every 6 (six) hours as needed. Patient taking differently: Take 500-1,000 mg by mouth 2 (two) times daily as needed for headache. 04/11/18   Georgetta Haber, NP  albuterol (PROAIR HFA) 108 (90 Base) MCG/ACT inhaler Inhale 2 puffs into the lungs every 6 (six) hours as needed for wheezing or shortness of breath. 08/25/21   Hoy Register, MD  albuterol (PROVENTIL) (2.5 MG/3ML) 0.083% nebulizer solution Take 3 mLs (2.5 mg total) by  nebulization every 6 (six) hours as needed for wheezing or shortness of breath. 08/25/21   Hoy Register, MD  ELIQUIS 5 MG TABS tablet Take 5 mg by mouth 2 (two) times daily. 12/09/21   [provider]  Ferrous Sulfate (IRON PO) Take 1 tablet by mouth in the morning, at noon, and at bedtime.    [provider]  fluticasone-salmeterol (ADVAIR DISKUS) 250-50 MCG/ACT AEPB Inhale 1 puff into the lungs in the morning and at bedtime. NEEDS PASS 08/09/21   Claiborne Rigg, NP  hydrOXYzine (ATARAX) 10 MG tablet Take 1 tablet (10 mg total) by mouth 3 (three) times daily as needed. For anxiety Patient taking differently: Take 10 mg by mouth 2 (two) times daily as needed for anxiety. 06/07/21   Claiborne Rigg, NP  midodrine (PROAMATINE) 5 MG tablet Take one tablet by mouth 3 times daily on non-dialysis days, Mon, Wed, Fri and Sun. 02/19/22   Sharlene Dory, PA-C  montelukast (SINGULAIR) 10 MG tablet Take 1 tablet (10 mg total) by mouth at bedtime. 06/07/21   Claiborne Rigg, NP  sildenafil (VIAGRA) 100 MG tablet Take 0.5-1 tablets (50-100 mg total) by mouth daily as needed for erectile dysfunction. 05/11/22   Claiborne Rigg, NP  traZODone (DESYREL) 50 MG tablet Take 1 tablet (50 mg total) by mouth at bedtime as needed for sleep. 09/08/21   Drema Dallas, MD  VELPHORO 500 MG chewable tablet Chew 1,000 mg by mouth 2 (two) times daily after a meal. 02/06/21   [provider]  VISINE 0.05 % ophthalmic solution Place 2-3 drops into both eyes 2 (two) times daily as needed (dry eye). 07/04/21   [provider]      Allergies    Codeine, Dextromethorphan-guaifenesin, Iron dextran, and Morphine and codeine    Review of Systems   Review of Systems  Respiratory:  Positive for shortness of breath.     Physical Exam Updated Vital Signs BP 122/86   Pulse 73   Temp 98 F (36.7 C) (Oral)   Resp 19   Ht 5\' 9"  (1.753 m)   Wt 74.7 kg   SpO2 97%   BMI 24.32 kg/m  Physical  Exam Vitals and nursing note reviewed.  Constitutional:      Appearance: Normal appearance.  HENT:     Head: Normocephalic and atraumatic.     Mouth/Throat:     Mouth: Mucous membranes are moist.  Eyes:     General: No scleral icterus. Cardiovascular:     Rate and Rhythm: Normal rate and regular rhythm.     Pulses: Normal pulses.     Heart sounds: Normal heart sounds.  Pulmonary:     Effort: Pulmonary effort is normal.     Breath sounds: Normal breath sounds.  Abdominal:     General: Abdomen is flat.     Palpations: Abdomen is soft.     Tenderness: There is no abdominal tenderness.  Musculoskeletal:        General: No deformity.  Skin:    General: Skin is warm.     Findings: No rash.  Neurological:     General: No focal deficit present.     Mental Status: He is alert.  Psychiatric:        Mood and Affect: Mood normal.     ED Results / Procedures / Treatments   Labs (all labs ordered are listed, but only abnormal results are displayed) Labs Reviewed  CBC - Abnormal; Notable for the following components:      Result Value   RDW 17.2 (*)    Platelets 84 (*)    All other components within normal limits  BASIC METABOLIC PANEL - Abnormal; Notable for the following components:   Chloride 94 (*)    Glucose, Bld 104 (*)    BUN 50 (*)    Creatinine, Ser 10.32 (*)    GFR, Estimated 5 (*)    Anion gap 19 (*)    All other components within normal limits  TROPONIN I (HIGH SENSITIVITY) - Abnormal; Notable for the following components:   Troponin I (High Sensitivity) 89 (*)    All other components within normal limits  TROPONIN I (HIGH SENSITIVITY) - Abnormal; Notable for the following components:   Troponin I (High Sensitivity) 92 (*)    All other components within normal limits  BASIC METABOLIC PANEL  HEPATIC FUNCTION PANEL  BRAIN NATRIURETIC PEPTIDE    EKG EKG Interpretation  Date/Time:  Tuesday June 12 2022 13:40:13 EDT Ventricular Rate:  82 PR Interval:    QRS  Duration: 126 QT Interval:  430 QTC Calculation: 502 R Axis:   28 Text Interpretation: Atrial fibrillation Left ventricular hypertrophy with QRS widening Cannot rule out Septal infarct , age undetermined Possible Inferior infarct , age undetermined T wave abnormality, consider lateral ischemia Abnormal ECG Confirmed by Gloris Manchester [phone]) on 06/12/2022 7:51:39 PM  Radiology DG Chest 2 View  Result Date: 06/12/2022 CLINICAL DATA:  Shortness of breath.  Had dialysis today. EXAM: CHEST - 2 VIEW COMPARISON:  Chest radiographs 02/24/2022 in 09/17/2021 FINDINGS: Left internal jugular dual-lumen central venous catheter tips again overlie the superior vena cava/right atrial junction. Cardiac silhouette is again moderately to markedly enlarged, unchanged. Mediastinal contours are grossly within normal limits. Calcification is seen within the aortic arch. The There are again moderately decreased lung volumes. There are patchy heterogeneous airspace opacities within the bilateral mid to lower lungs that are slightly increased from 02/24/2022 which may be due to the lower lung volumes. There is chronic bilateral mid and lower lung interstitial thickening. There is posterior costophrenic angle blunting and probable small pleural effusions, similar to 02/24/2022 radiographs. No acute skeletal abnormality. IMPRESSION: 1. Slightly increased patchy heterogeneous airspace opacities within the bilateral mid to lower lungs which may be due to the lower lung volumes. Superimposed mild pulmonary edema is also possible. 2. Chronic bilateral mid and lower lung interstitial thickening. 3. Probable small bilateral pleural effusions, similar to 02/24/2022. 4. Stable cardiomegaly. Electronically Signed   By: Neita Garnet M.D.   On: 06/12/2022 15:34    Procedures Procedures  {Document cardiac monitor, telemetry assessment procedure when appropriate:1}  Medications Ordered in ED Medications - No data to display  ED Course/ Medical  Decision Making/ A&P   {   Click here for ABCD2, HEART and other calculatorsREFRESH Note before signing :1}                          Medical Decision Making Amount and/or Complexity of Data Reviewed Labs: ordered. Radiology: ordered.   This patient presents to the ED for new syncope, this involves an extensive number of treatment options, and is a complaint that carries with a high risk of complications and morbidity.  The differential diagnosis includes anemia, hypoglycemia, arrhythmia, valvular disorders, PE, subarachnoid hemorrhage, orthostatic, dehydration, seizure, vasovagal.  This is not an exhaustive list.  Lab tests: I ordered and personally interpreted labs.  The pertinent results include: WBC unremarkable. Hbg unremarkable. Platelets unremarkable. Electrolytes unremarkable. BUN, creatinine unremarkable. ***  Imaging studies: I ordered imaging studies. I personally reviewed, interpreted imaging and agree with the radiologist's interpretations. The results include: ***   Problem list/ ED course/ Critical interventions/ Medical management: HPI: See above Vital signs ***within normal range and stable throughout visit. Laboratory/imaging studies significant for: See above. On physical examination, patient is afebrile and appears in no acute distress. *** I have reviewed the patient home medicines and have made adjustments as needed.  Cardiac monitoring/EKG: The patient was maintained on a cardiac monitor.  I personally reviewed and interpreted the cardiac monitor which showed an underlying rhythm of: sinus rhythm.  Additional history obtained: External records from outside source obtained and reviewed including: Chart review including previous notes, labs, imaging.  Consultations obtained: I requested consultation with Dr. ***, and discussed lab and imaging findings as well as pertinent plan.  He/she ***.  Disposition Continued outpatient therapy.  Follow-up with PCP***  recommended for reevaluation of symptoms. Treatment plan discussed with patient.  Pt acknowledged understanding was agreeable to the plan. Worrisome signs and symptoms were discussed with patient, and patient acknowledged understanding to return to the ED if they noticed these signs and symptoms. Patient was stable upon discharge.   This chart was dictated using voice recognition software.  Despite best efforts to proofread,  errors can occur which can change the documentation meaning.    {Document critical care time when appropriate:1} {Document review of labs and clinical decision tools ie heart score, Chads2Vasc2 etc:1}  {Document your independent review of radiology images, and any outside records:1} {Document your discussion with family members, caretakers, and with consultants:1} {Document social determinants of health affecting pt's care:1} {Document your decision making why or why not admission, treatments were needed:1} Final Clinical Impression(s) / ED Diagnoses Final diagnoses:  None    Rx / DC Orders ED Discharge Orders     None

## 2022-06-12 NOTE — ED Notes (Signed)
Ambulated pt, O2 sats started at 93, dropped to 88 while walking, returned to 94 once back in room.

## 2022-06-12 NOTE — ED Triage Notes (Signed)
Pt c/o SOB and states "I feel like I'm about to pass out"x1 hr. Pt had dialysis today. Pt goes to dialysis Tues, Thurs, Sat. Pt states he had his full treatment today. Pt is eupneic

## 2022-06-12 NOTE — ED Notes (Signed)
Pt's friend advised this RN that pt is supposed to be on 2L [place] continuously, pt has not relayed this message to any of the care team during his care, when asked why pt responded "I though I was doing fine without it", advised pt that may be why he has been feeling more shob as he is supposed to be on supplemental 02, sats 89% at current on RA, pt place back on 2L [place] as this is his regular.  P-C Jeanelle Malling notified

## 2022-06-12 NOTE — ED Notes (Signed)
Pt's monitor alarming showing possible runs of VT, pt sitting on side of bed lean over, leads tangle up, radial pulse assess not corresponding with monitor, Pt was repositioned in bed, monitor leads adjusted, monitor went back to NSR, radial pulse corresponding to HR 70-80s afib

## 2022-06-13 LAB — HEPATIC FUNCTION PANEL
ALT: 13 U/L (ref 0–44)
AST: 24 U/L (ref 15–41)
Albumin: 3.6 g/dL (ref 3.5–5.0)
Alkaline Phosphatase: 87 U/L (ref 38–126)
Bilirubin, Direct: 0.3 mg/dL — ABNORMAL HIGH (ref 0.0–0.2)
Indirect Bilirubin: 0.3 mg/dL (ref 0.3–0.9)
Total Bilirubin: 0.6 mg/dL (ref 0.3–1.2)
Total Protein: 7.8 g/dL (ref 6.5–8.1)

## 2022-06-14 ENCOUNTER — Other Ambulatory Visit: Payer: Self-pay

## 2022-06-14 ENCOUNTER — Encounter (HOSPITAL_COMMUNITY): Payer: Self-pay | Admitting: Pharmacy Technician

## 2022-06-14 ENCOUNTER — Emergency Department (HOSPITAL_COMMUNITY)
Admission: EM | Admit: 2022-06-14 | Discharge: 2022-06-15 | Disposition: A | Discharge status: Home / Self Care | Payer: Medicare Other | Attending: Emergency Medicine | Admitting: Emergency Medicine

## 2022-06-14 DIAGNOSIS — R0602 Shortness of breath: Secondary | ICD-10-CM | POA: Diagnosis not present

## 2022-06-14 DIAGNOSIS — I959 Hypotension, unspecified: Secondary | ICD-10-CM | POA: Diagnosis not present

## 2022-06-14 DIAGNOSIS — Z992 Dependence on renal dialysis: Secondary | ICD-10-CM | POA: Diagnosis not present

## 2022-06-14 DIAGNOSIS — R55 Syncope and collapse: Secondary | ICD-10-CM | POA: Diagnosis not present

## 2022-06-14 DIAGNOSIS — R000 Tachycardia, unspecified: Secondary | ICD-10-CM | POA: Insufficient documentation

## 2022-06-14 DIAGNOSIS — R42 Dizziness and giddiness: Secondary | ICD-10-CM | POA: Diagnosis present

## 2022-06-14 LAB — CBC
HCT: 51.2 % (ref 39.0–52.0)
Hemoglobin: 16 g/dL (ref 13.0–17.0)
MCH: 29.4 pg (ref 26.0–34.0)
MCHC: 31.3 g/dL (ref 30.0–36.0)
MCV: 93.9 fL (ref 80.0–100.0)
Platelets: 84 10*3/uL — ABNORMAL LOW (ref 150–400)
RBC: 5.45 MIL/uL (ref 4.22–5.81)
RDW: 16.8 % — ABNORMAL HIGH (ref 11.5–15.5)
WBC: 6.3 10*3/uL (ref 4.0–10.5)
nRBC: 0 % (ref 0.0–0.2)

## 2022-06-14 LAB — BASIC METABOLIC PANEL
Anion gap: 16 — ABNORMAL HIGH (ref 5–15)
BUN: 34 mg/dL — ABNORMAL HIGH (ref 6–20)
CO2: 26 mmol/L (ref 22–32)
Calcium: 8.9 mg/dL (ref 8.9–10.3)
Chloride: 93 mmol/L — ABNORMAL LOW (ref 98–111)
Creatinine, Ser: 8.66 mg/dL — ABNORMAL HIGH (ref 0.61–1.24)
GFR, Estimated: 7 mL/min — ABNORMAL LOW (ref >60)
Glucose, Bld: 73 mg/dL (ref 70–99)
Potassium: 4.3 mmol/L (ref 3.5–5.1)
Sodium: 135 mmol/L (ref 135–145)

## 2022-06-14 LAB — CBG MONITORING, ED: Glucose-Capillary: 70 mg/dL (ref 70–99)

## 2022-06-14 NOTE — ED Triage Notes (Signed)
Pt bib ems from dialysis with reports of brief episode of HR 180 with BP 70/40. On scene, pt reports no symptoms. Pt endorses this is the 2nd time this has happened at dialysis. Pt reports feeling like they are taking off too much fluid. Pt had 30 minutes left of treatment today.  HR 70 afib 120/90 99% 2L (baseline) CBG 78

## 2022-06-14 NOTE — Discharge Instructions (Signed)
Your blood pressure was in the 70s/50s following dialysis.  It is important to let your dialysis team know this so that they can adjust your fluid off-take if needed.

## 2022-06-14 NOTE — ED Provider Notes (Signed)
[facility]-EMERGENCY DEPT [facility] Emergency Department Provider Note MRN:  [id number]  Arrival date & time: 06/14/22     Chief Complaint   Near Syncope   History of Present Illness   Nathaniel White is a 62 y.o. year-old male presents to the ED with chief complaint of shortness of breath and lightheadedness earlier today.  He states that he had dialysis this morning and feels like they took off too much fluid.  He states that he felt like he was going to pass out.  He was brought in by EMS.  On arrival of EMS, his blood pressure was 70/50 and heart rate was briefly in the 180s.  He states that he felt like this during his last dialysis session as well.  He states that he feels well now.  No complaints now.  Wears 2L at baseline.  History provided by patient.   Review of Systems  Pertinent positive and negative review of systems noted in HPI.    Physical Exam   Vitals:   06/14/22 2127 06/14/22 2331  BP: 120/88 130/85  Pulse: 80 72  Resp: 18   Temp: 98 F (36.7 C) 97.8 F (36.6 C)  SpO2: 100% 98%    CONSTITUTIONAL:  well-appearing, NAD NEURO:  Alert and oriented x 3, CN 3-12 grossly intact EYES:  eyes equal and reactive ENT/NECK:  Supple, no stridor  CARDIO:  normal rate, regular rhythm, appears well-perfused  PULM:  No respiratory distress, CTAB GI/GU:  non-distended,  MSK/SPINE:  No gross deformities, no edema, moves all extremities  SKIN:  no rash, atraumatic   *Additional and/or pertinent findings included in MDM below  Diagnostic and Interventional Summary    EKG Interpretation  Date/Time:  Thursday June 14 2022 12:02:22 EDT Ventricular Rate:  84 PR Interval:    QRS Duration: 128 QT Interval:  432 QTC Calculation: 510 R Axis:   -18 Text Interpretation: Atrial fibrillation with premature ventricular or aberrantly conducted complexes Non-specific intra-ventricular conduction block Minimal voltage criteria for LVH, may be normal variant ( Cornell product )  Lateral infarct , age undetermined Inferior infarct , age undetermined Abnormal ECG When compared with ECG of 12-Jun-2022 13:40, PREVIOUS ECG IS PRESENT Confirmed by Eber Hong ([id number]) on 06/14/2022 12:20:06 PM       Labs Reviewed  BASIC METABOLIC PANEL - Abnormal; Notable for the following components:      Result Value   Chloride 93 (*)    BUN 34 (*)    Creatinine, Ser 8.66 (*)    GFR, Estimated 7 (*)    Anion gap 16 (*)    All other components within normal limits  CBC - Abnormal; Notable for the following components:   RDW 16.8 (*)    Platelets 84 (*)    All other components within normal limits  CBG MONITORING, ED    No orders to display    Medications - No data to display   Procedures  /  Critical Care Procedures  ED Course and Medical Decision Making  I have reviewed the triage vital signs, the nursing notes, and pertinent available records from the EMR.  Social Determinants Affecting Complexity of Care: Patient has no clinically significant social determinants affecting this chief complaint..   ED Course:    Medical Decision Making Patient here after episode of hypotension and tachycardia following dialysis this morning.  Since then his symptoms have completely resolved.  He states that he feels well now.  His labs are at or near  baseline.  Blood pressure is stable.  He appears well and stable for outpatient discharge with nephrology follow-up.  Amount and/or Complexity of Data Reviewed Labs: ordered.     Consultants: No consultations were needed in caring for this patient.   Treatment and Plan: I considered admission due to patient's initial presentation, but after considering the examination and diagnostic results, patient will not require admission and can be discharged with outpatient follow-up.    Final Clinical Impressions(s) / ED Diagnoses     ICD-10-CM   1. Near syncope  R55       ED Discharge Orders     None         Discharge  Instructions Discussed with and Provided to Patient:     Discharge Instructions      Your blood pressure was in the 70s/50s following dialysis.  It is important to let your dialysis team know this so that they can adjust your fluid off-take if needed.       Roxy Horseman, PA-C 06/14/22 2340    Mesner, Barbara Cower, MD 06/15/22 [phone]

## 2022-06-14 NOTE — ED Provider Triage Note (Signed)
Emergency Medicine Provider Triage Evaluation Note  Nathaniel White , a 62 y.o. male  was evaluated in triage.  Pt complains of near syncope. Brought in by EMS from dialysis, t/th/sat, was about 30 min from finishing when he felt his heart racing, SHOB, diaphoretic. Symptoms lasted about 1 hour and have since completely resolved. Had similar symptoms on Tuesday and was seen here then as well.  He is concerned they are pulling too much off. No CP.    Review of Systems  Positive:  Negative:   Physical Exam  BP 112/87   Pulse 81   Temp 97.6 F (36.4 C)   Resp 16   SpO2 97%  Gen:   Awake, no distress   Resp:  Normal effort  MSK:   Moves extremities without difficulty  Other:    Medical Decision Making  Medically screening exam initiated at 12:13 PM.  Appropriate orders placed.  Nathaniel White was informed that the remainder of the evaluation will be completed by another provider, this initial triage assessment does not replace that evaluation, and the importance of remaining in the ED until their evaluation is complete.     Jeannie Fend, PA-C 06/14/22 1215

## 2022-06-19 ENCOUNTER — Telehealth: Payer: Self-pay | Admitting: Cardiovascular Disease

## 2022-06-19 ENCOUNTER — Other Ambulatory Visit: Payer: Self-pay

## 2022-06-19 ENCOUNTER — Other Ambulatory Visit: Payer: Self-pay | Admitting: Physician Assistant

## 2022-06-19 ENCOUNTER — Ambulatory Visit: Payer: Medicare Other

## 2022-06-19 ENCOUNTER — Ambulatory Visit (INDEPENDENT_AMBULATORY_CARE_PROVIDER_SITE_OTHER): Payer: Medicare Other | Admitting: Physician Assistant

## 2022-06-19 ENCOUNTER — Encounter: Payer: Self-pay | Admitting: Physician Assistant

## 2022-06-19 ENCOUNTER — Inpatient Hospital Stay (HOSPITAL_COMMUNITY)
Admission: EM | Admit: 2022-06-19 | Discharge: 2022-06-28 | DRG: 286 | Disposition: A | Discharge status: Home / Self Care | Payer: Medicare Other | Attending: Internal Medicine | Admitting: Internal Medicine

## 2022-06-19 ENCOUNTER — Encounter (HOSPITAL_COMMUNITY): Payer: Self-pay

## 2022-06-19 ENCOUNTER — Ambulatory Visit (HOSPITAL_BASED_OUTPATIENT_CLINIC_OR_DEPARTMENT_OTHER): Payer: Medicare Other

## 2022-06-19 ENCOUNTER — Emergency Department (HOSPITAL_COMMUNITY): Payer: Medicare Other

## 2022-06-19 VITALS — BP 138/60 | HR 89 | Resp 16 | Wt 169.8 lb

## 2022-06-19 DIAGNOSIS — Z79899 Other long term (current) drug therapy: Secondary | ICD-10-CM

## 2022-06-19 DIAGNOSIS — R0602 Shortness of breath: Secondary | ICD-10-CM

## 2022-06-19 DIAGNOSIS — Z89022 Acquired absence of left finger(s): Secondary | ICD-10-CM

## 2022-06-19 DIAGNOSIS — I4719 Other supraventricular tachycardia: Secondary | ICD-10-CM

## 2022-06-19 DIAGNOSIS — I4892 Unspecified atrial flutter: Secondary | ICD-10-CM | POA: Diagnosis present

## 2022-06-19 DIAGNOSIS — E875 Hyperkalemia: Secondary | ICD-10-CM | POA: Diagnosis not present

## 2022-06-19 DIAGNOSIS — I5020 Unspecified systolic (congestive) heart failure: Secondary | ICD-10-CM | POA: Insufficient documentation

## 2022-06-19 DIAGNOSIS — D631 Anemia in chronic kidney disease: Secondary | ICD-10-CM | POA: Diagnosis present

## 2022-06-19 DIAGNOSIS — Z86711 Personal history of pulmonary embolism: Secondary | ICD-10-CM

## 2022-06-19 DIAGNOSIS — R627 Adult failure to thrive: Secondary | ICD-10-CM | POA: Diagnosis present

## 2022-06-19 DIAGNOSIS — I9589 Other hypotension: Secondary | ICD-10-CM | POA: Insufficient documentation

## 2022-06-19 DIAGNOSIS — Z888 Allergy status to other drugs, medicaments and biological substances status: Secondary | ICD-10-CM

## 2022-06-19 DIAGNOSIS — R002 Palpitations: Secondary | ICD-10-CM | POA: Insufficient documentation

## 2022-06-19 DIAGNOSIS — I272 Pulmonary hypertension, unspecified: Secondary | ICD-10-CM

## 2022-06-19 DIAGNOSIS — Z8249 Family history of ischemic heart disease and other diseases of the circulatory system: Secondary | ICD-10-CM

## 2022-06-19 DIAGNOSIS — E785 Hyperlipidemia, unspecified: Secondary | ICD-10-CM | POA: Diagnosis present

## 2022-06-19 DIAGNOSIS — Z91199 Patient's noncompliance with other medical treatment and regimen due to unspecified reason: Secondary | ICD-10-CM

## 2022-06-19 DIAGNOSIS — I4819 Other persistent atrial fibrillation: Secondary | ICD-10-CM | POA: Diagnosis present

## 2022-06-19 DIAGNOSIS — M898X9 Other specified disorders of bone, unspecified site: Secondary | ICD-10-CM | POA: Diagnosis present

## 2022-06-19 DIAGNOSIS — I132 Hypertensive heart and chronic kidney disease with heart failure and with stage 5 chronic kidney disease, or end stage renal disease: Secondary | ICD-10-CM | POA: Diagnosis present

## 2022-06-19 DIAGNOSIS — I420 Dilated cardiomyopathy: Secondary | ICD-10-CM | POA: Diagnosis present

## 2022-06-19 DIAGNOSIS — Z992 Dependence on renal dialysis: Secondary | ICD-10-CM

## 2022-06-19 DIAGNOSIS — Z7951 Long term (current) use of inhaled steroids: Secondary | ICD-10-CM

## 2022-06-19 DIAGNOSIS — Y830 Surgical operation with transplant of whole organ as the cause of abnormal reaction of the patient, or of later complication, without mention of misadventure at the time of the procedure: Secondary | ICD-10-CM | POA: Diagnosis present

## 2022-06-19 DIAGNOSIS — J4489 Other specified chronic obstructive pulmonary disease: Secondary | ICD-10-CM | POA: Diagnosis present

## 2022-06-19 DIAGNOSIS — N186 End stage renal disease: Secondary | ICD-10-CM

## 2022-06-19 DIAGNOSIS — I4891 Unspecified atrial fibrillation: Secondary | ICD-10-CM

## 2022-06-19 DIAGNOSIS — Z87891 Personal history of nicotine dependence: Secondary | ICD-10-CM

## 2022-06-19 DIAGNOSIS — Z833 Family history of diabetes mellitus: Secondary | ICD-10-CM

## 2022-06-19 DIAGNOSIS — N2581 Secondary hyperparathyroidism of renal origin: Secondary | ICD-10-CM | POA: Diagnosis present

## 2022-06-19 DIAGNOSIS — R570 Cardiogenic shock: Secondary | ICD-10-CM | POA: Diagnosis not present

## 2022-06-19 DIAGNOSIS — I10 Essential (primary) hypertension: Secondary | ICD-10-CM | POA: Diagnosis present

## 2022-06-19 DIAGNOSIS — E119 Type 2 diabetes mellitus without complications: Secondary | ICD-10-CM

## 2022-06-19 DIAGNOSIS — I69354 Hemiplegia and hemiparesis following cerebral infarction affecting left non-dominant side: Secondary | ICD-10-CM

## 2022-06-19 DIAGNOSIS — Z7901 Long term (current) use of anticoagulants: Secondary | ICD-10-CM

## 2022-06-19 DIAGNOSIS — I472 Ventricular tachycardia, unspecified: Principal | ICD-10-CM | POA: Diagnosis present

## 2022-06-19 DIAGNOSIS — I081 Rheumatic disorders of both mitral and tricuspid valves: Secondary | ICD-10-CM | POA: Diagnosis present

## 2022-06-19 DIAGNOSIS — I5084 End stage heart failure: Secondary | ICD-10-CM | POA: Diagnosis present

## 2022-06-19 DIAGNOSIS — Z66 Do not resuscitate: Secondary | ICD-10-CM | POA: Diagnosis not present

## 2022-06-19 DIAGNOSIS — E1151 Type 2 diabetes mellitus with diabetic peripheral angiopathy without gangrene: Secondary | ICD-10-CM | POA: Diagnosis present

## 2022-06-19 DIAGNOSIS — I480 Paroxysmal atrial fibrillation: Secondary | ICD-10-CM

## 2022-06-19 DIAGNOSIS — Z89422 Acquired absence of other left toe(s): Secondary | ICD-10-CM

## 2022-06-19 DIAGNOSIS — R001 Bradycardia, unspecified: Secondary | ICD-10-CM | POA: Diagnosis not present

## 2022-06-19 DIAGNOSIS — I493 Ventricular premature depolarization: Secondary | ICD-10-CM | POA: Diagnosis present

## 2022-06-19 DIAGNOSIS — N281 Cyst of kidney, acquired: Secondary | ICD-10-CM | POA: Diagnosis present

## 2022-06-19 DIAGNOSIS — I5082 Biventricular heart failure: Secondary | ICD-10-CM | POA: Diagnosis present

## 2022-06-19 DIAGNOSIS — J9601 Acute respiratory failure with hypoxia: Secondary | ICD-10-CM | POA: Diagnosis not present

## 2022-06-19 DIAGNOSIS — E1122 Type 2 diabetes mellitus with diabetic chronic kidney disease: Secondary | ICD-10-CM | POA: Diagnosis present

## 2022-06-19 DIAGNOSIS — I5023 Acute on chronic systolic (congestive) heart failure: Secondary | ICD-10-CM | POA: Diagnosis not present

## 2022-06-19 DIAGNOSIS — T8612 Kidney transplant failure: Secondary | ICD-10-CM | POA: Diagnosis present

## 2022-06-19 DIAGNOSIS — I700 Atherosclerosis of aorta: Secondary | ICD-10-CM | POA: Diagnosis present

## 2022-06-19 DIAGNOSIS — Z885 Allergy status to narcotic agent status: Secondary | ICD-10-CM

## 2022-06-19 LAB — CBC WITH DIFFERENTIAL/PLATELET
Abs Immature Granulocytes: 0.01 10*3/uL (ref 0.00–0.07)
Basophils Absolute: 0.1 10*3/uL (ref 0.0–0.1)
Basophils Relative: 1 %
Eosinophils Absolute: 0.2 10*3/uL (ref 0.0–0.5)
Eosinophils Relative: 4 %
HCT: 42.1 % (ref 39.0–52.0)
Hemoglobin: 12.9 g/dL — ABNORMAL LOW (ref 13.0–17.0)
Immature Granulocytes: 0 %
Lymphocytes Relative: 11 %
Lymphs Abs: 0.6 10*3/uL — ABNORMAL LOW (ref 0.7–4.0)
MCH: 29.7 pg (ref 26.0–34.0)
MCHC: 30.6 g/dL (ref 30.0–36.0)
MCV: 96.8 fL (ref 80.0–100.0)
Monocytes Absolute: 0.7 10*3/uL (ref 0.1–1.0)
Monocytes Relative: 12 %
Neutro Abs: 3.9 10*3/uL (ref 1.7–7.7)
Neutrophils Relative %: 72 %
Platelets: 64 10*3/uL — ABNORMAL LOW (ref 150–400)
RBC: 4.35 MIL/uL (ref 4.22–5.81)
RDW: 17.2 % — ABNORMAL HIGH (ref 11.5–15.5)
WBC: 5.5 10*3/uL (ref 4.0–10.5)
nRBC: 0 % (ref 0.0–0.2)

## 2022-06-19 LAB — TSH: TSH: 2.46 u[IU]/mL (ref 0.350–4.500)

## 2022-06-19 LAB — ECHOCARDIOGRAM LIMITED: MV Peak grad: 79.6 mmHg

## 2022-06-19 MED ORDER — AMIODARONE HCL 200 MG PO TABS: 200.0000 mg | ORAL_TABLET | Freq: Two times a day (BID) | ORAL | 3 refills | Status: DC

## 2022-06-19 MED ORDER — AMIODARONE HCL IN DEXTROSE 360-4.14 MG/200ML-% IV SOLN: 30.0000 mg/h | INTRAVENOUS | Status: DC

## 2022-06-19 MED ORDER — MAGNESIUM SULFATE 2 GM/50ML IV SOLN
2.0000 g | Freq: Once | INTRAVENOUS | Status: AC
  Administered 2022-06-19: 2 g via INTRAVENOUS

## 2022-06-19 MED ORDER — AMIODARONE HCL IN DEXTROSE 360-4.14 MG/200ML-% IV SOLN
30.0000 mg/h | INTRAVENOUS | Status: DC
  Administered 2022-06-20 – 2022-06-21 (×4): 30 mg/h via INTRAVENOUS
  Filled 2022-06-19 (×4): qty 200

## 2022-06-19 MED ORDER — AMIODARONE LOAD VIA INFUSION
150.0000 mg | Freq: Once | INTRAVENOUS | Status: AC
  Administered 2022-06-19: 150 mg via INTRAVENOUS

## 2022-06-19 MED ORDER — AMIODARONE HCL IN DEXTROSE 360-4.14 MG/200ML-% IV SOLN: 60.0000 mg/h | INTRAVENOUS | Status: DC

## 2022-06-19 MED ORDER — AMIODARONE HCL IN DEXTROSE 360-4.14 MG/200ML-% IV SOLN
60.0000 mg/h | INTRAVENOUS | Status: AC
  Administered 2022-06-19: 60 mg/h via INTRAVENOUS
  Filled 2022-06-19: qty 200

## 2022-06-19 NOTE — Assessment & Plan Note (Signed)
He remains on Tues, Thurs, Sat dialysis.

## 2022-06-19 NOTE — Patient Instructions (Addendum)
Medication Instructions:  Your physician has recommended you make the following change in your medication:  1-START amiodarone 200 mg by mouth twice daily for 2 weeks then take one tablet by mouth daily.    *If you need a refill on your cardiac medications before your next appointment, please call your pharmacy*   Lab Work: None ordered If you have labs (blood work) drawn today and your tests are completely normal, you will receive your results only by: MyChart Message (if you have MyChart) OR A paper copy in the mail If you have any lab test that is abnormal or we need to change your treatment, we will call you to review the results.   Testing/Procedures: Your physician has requested that you have an echocardiogram. Echocardiography is a painless test that uses sound waves to create images of your heart. It provides your doctor with information about the size and shape of your heart and how well your heart's chambers and valves are working. This procedure takes approximately one hour. There are no restrictions for this procedure. Please do NOT wear cologne, perfume, aftershave, or lotions (deodorant is allowed). Please arrive 15 minutes prior to your appointment time.  ZIO XT- Long Term Monitor Instructions  Your physician has requested you wear a ZIO patch monitor for 14 days.  This is a single patch monitor. [employer] supplies one patch monitor per enrollment. Additional stickers are not available. Please do not apply patch if you will be having a Nuclear Stress Test,  Echocardiogram, Cardiac CT, MRI, or Chest Xray during the period you would be wearing the  monitor. The patch cannot be worn during these tests. You cannot remove and re-apply the  ZIO XT patch monitor.  Your ZIO patch monitor will be mailed 3 day USPS to your address on file. It may take 3-5 days  to receive your monitor after you have been enrolled.  Once you have received your monitor, please review the enclosed  instructions. Your monitor  has already been registered assigning a specific monitor serial # to you.  Billing and Patient Assistance Program Information  We have supplied [employer] with any of your insurance information on file for billing purposes. [employer] offers a sliding scale Patient Assistance Program for patients that do not have  insurance, or whose insurance does not completely cover the cost of the ZIO monitor.  You must apply for the Patient Assistance Program to qualify for this discounted rate.  To apply, please call [employer] at [phone], select option 4, select option 2, ask to apply for  Patient Assistance Program. Meredeth Ide will ask your household income, and how many people  are in your household. They will quote your out-of-pocket cost based on that information.  [employer] will also be able to set up a 57-month, interest-free payment plan if needed.  Applying the monitor   Shave hair from upper left chest.  Hold abrader disc by orange tab. Rub abrader in 40 strokes over the upper left chest as  indicated in your monitor instructions.  Clean area with 4 enclosed alcohol pads. Let dry.  Apply patch as indicated in monitor instructions. Patch will be placed under collarbone on left  side of chest with arrow pointing upward.  Rub patch adhesive wings for 2 minutes. Remove white label marked "1". Remove the white  label marked "2". Rub patch adhesive wings for 2 additional minutes.  While looking in a mirror, press and release button in center of patch. A small green light will  flash 3-4 times. This will be your only indicator that the monitor has been turned on.  Do not shower for the first 24 hours. You may shower after the first 24 hours.  Press the button if you feel a symptom. You will hear a small click. Record Date, Time and  Symptom in the Patient Logbook.  When you are ready to remove the patch, follow instructions on the last 2 pages of Patient  Logbook. Stick patch  monitor onto the last page of Patient Logbook.  Place Patient Logbook in the blue and white box. Use locking tab on box and tape box closed  securely. The blue and white box has prepaid postage on it. Please place it in the mailbox as  soon as possible. Your physician should have your test results approximately 7 days after the  monitor has been mailed back to [facility].  Call [facility] Customer Care at [phone] if you have questions regarding  your ZIO XT patch monitor. Call them immediately if you see an orange light blinking on your  monitor.  If your monitor falls off in less than 4 days, contact our Monitor department at [phone].  If your monitor becomes loose or falls off after 4 days call [employer] at [phone] for  suggestions on securing your monitor    You have been referred to HEART FAILURE TEAM   Follow-Up: At [facility], you and your health needs are our priority.  As part of our continuing mission to provide you with exceptional heart care, we have created designated Provider Care Teams.  These Care Teams include your primary Cardiologist (physician) and Advanced Practice Providers (APPs -  Physician Assistants and Nurse Practitioners) who all work together to provide you with the care you need, when you need it.  We recommend signing up for the patient portal called "MyChart".  Sign up information is provided on this After Visit Summary.  MyChart is used to connect with patients for Virtual Visits (Telemedicine).  Patients are able to view lab/test results, encounter notes, upcoming appointments, etc.  Non-urgent messages can be sent to your provider as well.   To learn more about what you can do with MyChart, go to [email or web address].    Your next appointment:   2 weeks   Provider:   [name] PA  Other Instructions You have been referred to EP for atrial fib.

## 2022-06-19 NOTE — ED Provider Notes (Signed)
[place] EMERGENCY DEPARTMENT AT [facility] Provider Note   CSN: [id number] Arrival date & time: 06/19/22  2237     History Chief Complaint  Patient presents with   Irregular Heart Beat   Chest Pain   Shortness of Breath    HPI Nathaniel White is a 62 y.o. male presenting for chief complaint of palpitations.  62 year old male extensive medical history including ESRD.  He comes in tonight in ventricular tachycardia intermittently.  Has been having palpitations throughout the day with started on amiodarone by his cardiology team earlier today for palpitations. EMS arrived and he was in V. tach  .   Patient's recorded medical, surgical, social, medication list and allergies were reviewed in the Snapshot window as part of the initial history.   Review of Systems   Review of Systems  Constitutional:  Negative for chills and fever.  HENT:  Negative for ear pain and sore throat.   Eyes:  Negative for pain and visual disturbance.  Respiratory:  Negative for cough and shortness of breath.   Cardiovascular:  Positive for palpitations. Negative for chest pain.  Gastrointestinal:  Negative for abdominal pain and vomiting.  Genitourinary:  Negative for dysuria and hematuria.  Musculoskeletal:  Negative for arthralgias and back pain.  Skin:  Negative for color change and rash.  Neurological:  Negative for seizures and syncope.  All other systems reviewed and are negative.   Physical Exam Updated Vital Signs BP 98/62   Pulse (!) 40   Resp 18   Ht 5\' 9"  (1.753 m)   Wt 77 kg   SpO2 (!) 85%   BMI 25.07 kg/m  Physical Exam Vitals and nursing note reviewed.  Constitutional:      General: He is not in acute distress.    Appearance: He is well-developed.  HENT:     Head: Normocephalic and atraumatic.  Eyes:     Conjunctiva/sclera: Conjunctivae normal.  Cardiovascular:     Rate and Rhythm: Normal rate and regular rhythm.     Heart sounds: No murmur  heard. Pulmonary:     Effort: Pulmonary effort is normal. No respiratory distress.     Breath sounds: Normal breath sounds.  Abdominal:     Palpations: Abdomen is soft.     Tenderness: There is no abdominal tenderness.  Musculoskeletal:        General: No swelling.     Cervical back: Neck supple.  Skin:    General: Skin is warm and dry.     Capillary Refill: Capillary refill takes less than 2 seconds.  Neurological:     Mental Status: He is alert.  Psychiatric:        Mood and Affect: Mood normal.      ED Course/ Medical Decision Making/ A&P    Procedures .Critical Care  Performed by: Glyn Ade, MD Authorized by: Glyn Ade, MD   Critical care provider statement:    Critical care time (minutes):  80   Critical care was necessary to treat or prevent imminent or life-threatening deterioration of the following conditions:  Circulatory failure and cardiac failure   Critical care was time spent personally by me on the following activities:  Development of treatment plan with patient or surrogate, discussions with consultants, evaluation of patient's response to treatment, examination of patient, ordering and review of laboratory studies, ordering and review of radiographic studies, ordering and performing treatments and interventions, pulse oximetry, re-evaluation of patient's condition and review of old charts  Care discussed with: admitting provider      Medications Ordered in ED Medications  amiodarone (NEXTERONE PREMIX) 360-4.14 MG/200ML-% (1.8 mg/mL) IV infusion (60 mg/hr Intravenous New Bag/Given 06/19/22 2249)    Followed by  amiodarone (NEXTERONE PREMIX) 360-4.14 MG/200ML-% (1.8 mg/mL) IV infusion (has no administration in time range)  midodrine (PROAMATINE) tablet 10 mg (has no administration in time range)  apixaban (ELIQUIS) tablet 5 mg (has no administration in time range)  albuterol (PROVENTIL) (2.5 MG/3ML) 0.083% nebulizer solution 2.5 mg (has no  administration in time range)  mometasone-formoterol (DULERA) 200-5 MCG/ACT inhaler 2 puff (has no administration in time range)  sucroferric oxyhydroxide (VELPHORO) chewable tablet 1,000 mg (has no administration in time range)  sodium chloride flush (NS) 0.9 % injection 3 mL (has no administration in time range)  sodium chloride flush (NS) 0.9 % injection 3 mL (has no administration in time range)  0.9 %  sodium chloride infusion (has no administration in time range)  acetaminophen (TYLENOL) tablet 650 mg (has no administration in time range)  ondansetron (ZOFRAN) injection 4 mg (has no administration in time range)  magnesium sulfate IVPB 2 g 50 mL (0 g Intravenous Stopped 06/20/22 0009)  amiodarone (NEXTERONE) 1.8 mg/mL load via infusion 150 mg (150 mg Intravenous Bolus from Bag 06/19/22 2247)    Medical Decision Making:    [name] is a 62 y.o. presenting with ventricular tachycardia. Intermittently going in and out of ventricular tachycardia on the monitor and on EKGs on arrival to the emergency room.  Given his extensive medical history, likely primary cardiac disease rather than secondary to other illness.  Starting on amiodarone bolus to 150 mg with 60 mg continuous per hour initiated.  Patient had improvement in episodes of ventricular tachycardia, decreased to short runs and ultimately ceased with the addition of magnesium 2 g IV. Consulted cardiology who agrees with current care and plan.  Will plan to admit patient to acute heart bed and are coming for consultation at this time.  Disposition:   Based on the above findings, I believe this patient is stable for admission.    Patient/family educated about specific findings on our evaluation and explained exact reasons for admission.  Patient/family educated about clinical situation and time was allowed to answer questions.   Admission team communicated with and agreed with need for admission. Patient admitted. Patient ready to  move at this time.     Emergency Department Medication Summary:   Medications  amiodarone (NEXTERONE PREMIX) 360-4.14 MG/200ML-% (1.8 mg/mL) IV infusion (60 mg/hr Intravenous New Bag/Given 06/19/22 2249)    Followed by  amiodarone (NEXTERONE PREMIX) 360-4.14 MG/200ML-% (1.8 mg/mL) IV infusion (has no administration in time range)  midodrine (PROAMATINE) tablet 10 mg (has no administration in time range)  apixaban (ELIQUIS) tablet 5 mg (has no administration in time range)  albuterol (PROVENTIL) (2.5 MG/3ML) 0.083% nebulizer solution 2.5 mg (has no administration in time range)  mometasone-formoterol (DULERA) 200-5 MCG/ACT inhaler 2 puff (has no administration in time range)  sucroferric oxyhydroxide (VELPHORO) chewable tablet 1,000 mg (has no administration in time range)  sodium chloride flush (NS) 0.9 % injection 3 mL (has no administration in time range)  sodium chloride flush (NS) 0.9 % injection 3 mL (has no administration in time range)  0.9 %  sodium chloride infusion (has no administration in time range)  acetaminophen (TYLENOL) tablet 650 mg (has no administration in time range)  ondansetron (ZOFRAN) injection 4 mg (has no administration in time range)  magnesium sulfate IVPB 2 g 50 mL (0 g Intravenous Stopped 06/20/22 0009)  amiodarone (NEXTERONE) 1.8 mg/mL load via infusion 150 mg (150 mg Intravenous Bolus from Bag 06/19/22 2247)        Clinical Impression:  1. V-tach (HCC)      Admit   Final Clinical Impression(s) / ED Diagnoses Final diagnoses:  V-tach [facility])    Rx / DC Orders ED Discharge Orders     None         Glyn Ade, MD 06/20/22 0041

## 2022-06-19 NOTE — Assessment & Plan Note (Addendum)
EF 30-35 on last echocardiogram in 08/2021. He also had mod to severe MR. Volume management per dialysis. His dialysis sessions are getting cut short and he remains volume overloaded. He is not on GDMT due to hx of hypotension. As noted, I discussed with Dr. Elease Hashimoto. We felt that he may benefit from a referral to the [facility] Clinic to see if there is anything else that can be done for managing his heart failure, valvular heart disease.  Refer to [facility]

## 2022-06-19 NOTE — ED Triage Notes (Signed)
Patient BIB [facility] from home with complaint of chest pain & SOB.   Upon EMS arrival patient was in V tach, EMS started IV, administered 150 mg Amnio.  CBG 147 P 80 BP 116/82 100% on 2L [place]

## 2022-06-19 NOTE — Assessment & Plan Note (Signed)
He has had several episodes, mainly associated with dialysis, of rapid palpitations and associated shortness of breath. His EKG today still shows atrial fibrillation with controlled rated. He remains on Eliquis. Etiology of his symptoms are not entirely clear. He has had documented low BP. His symptoms could be physiologic and related to hypotension or volume excess. However, he does have a dilated cardiomyopathy. Question if her could be having ventricular tachycardia. He has not had syncope. Labs from the ED a couple of days ago showed normal K+ and normal Hgb. He has issues with low BP and takes Midodrine three times a day every day. He is really on max dose Midodrine. I am hesitant to put him on a beta-blocker on non-dialysis and cause more hypotension. I did d/w our PharmD who suggested considering Metoprolol b/c it would be less likely to cause hypotension. I also reviewed his case with Dr. Elease Hashimoto (attending MD). We decided to hold off on beta-blocker Rx b/c of the concern to cause worse symptoms from more hypotension.  Obtain echocardiogram ASAP to rule out pericardial effusion, worsening EF, worsening MR Obtain 14 day Zio AT to assess rhythm F/u next 2 mos with Dr. Clifton James

## 2022-06-19 NOTE — Assessment & Plan Note (Signed)
He is essentially on max dose Midodrine. Continue current Rx.

## 2022-06-19 NOTE — Assessment & Plan Note (Signed)
He has persistently been in atrial fibrillation since last year. He remains on anticoagulation. Continue Eliquis 5 mg twice daily (weight > 60 kg, age < 47).

## 2022-06-19 NOTE — Progress Notes (Addendum)
Cardiology Office Note:    Date:  06/19/2022  ID:  Nathaniel White, DOB Sep 27, 1960, MRN [id number] PCP: Claiborne Rigg, NP  [facility] Providers Cardiologist:  Verne Carrow, MD       Patient Profile:      (HFrEF) heart failure with reduced ejection fraction  Non-ischemic cardiomyopathy  LHC 10/2013: No CAD TTE 01/21/2019: EF 40-45, RVSP 40.8, mild MR TTE 03/13/2021: EF 35, RVSP 78.7, moderate MR TTE 09/05/2021: EF 30-35, global HK, severe LVH, moderately reduced RVSF, severe pulmonary hypertension, RVSP 62.5, severe LAE, mild RAE, degenerative mitral valve, moderate to severe MR, mild mitral stenosis, mean gradient 5, moderate to severe TR, AV sclerosis Mitral regurgitation  Paroxsymal atrial fibrillation  Hx of pulmonary embolism  Pulmonary hypertension  Aortic atherosclerosis  ESRD   S/p failed Renal Tx Chronic Obstructive Pulmonary Disease  Diabetes mellitus  Hx of CVA  Hypotension Midodrine Rx  Hyperlipidemia  Peripheral arterial disease  Hx of GI bleed       History of Present Illness:   Nathaniel White is a 62 y.o. male who returns for the evaluation of tachycardia. He was seen by our service in March 2023 during an admission for AV fistula revision.  His postop course was complicated by hypotension and respiratory failure requiring intubation.  He was noted to be in new onset atrial fibrillation.  He was placed on apixaban 2.5 mg twice daily due to increased risk of bleeding.  He was seen again by our service in August 2023 for nonsustained VT during an admission for pulmonary embolism.  He was last seen by our service in clinic 02/19/2022 by Jari Favre, PA-C. One year f/u was planned.  He was seen in the emergency room on 6//24 and 06/14/2022.  Both episodes occurred after dialysis associated with hypotension.  His troponins were mildly elevated but flat (89-92).  Hemoglobin was normal at 16.  K+ was normal at 4.3.  Chest x-ray demonstrated findings suggestive of  pulmonary edema.  EKG demonstrated atrial fibrillation with controlled ventricular rate. He is here with a friend today. He has had episodes of rapid HRs after dialysis since last Thursday. He gets short of breath with this and has had low BP documented. SBP was in the 70s last week. He has not had chest pain, syncope. He is not getting to his dry weight. He notes his dry weight is 70.5 kg and he was 77 kg today.  He did have another episode of tachycardia on his way here today, in the car. He has not had episodes on non-dialysis days.   Review of Systems  Respiratory:  Positive for cough (clear sputum).   Gastrointestinal:  Negative for diarrhea, hematochezia, melena and vomiting.   see the HPI    Studies Reviewed:    EKG: 34-year-old fibrillation, HR 89, left axis deviation, IVCD, nonspecific ST-T wave changes, QTc 540  Risk Assessment/Calculations:    CHA2DS2-VASc Score = 6   This indicates a 9.7% annual risk of stroke. The patient's score is based upon: CHF History: 1 HTN History: 1 Diabetes History: 1 Stroke History: 2 Vascular Disease History: 1 Age Score: 0 Gender Score: 0            Physical Exam:   VS:  BP 138/60 (BP Location: Right Arm, Patient Position: Sitting, Cuff Size: Large)   Pulse 89   Resp 16   Wt 169 lb 12.1 oz (77 kg)   BMI 25.07 kg/m    Wt Readings  from Last 3 Encounters:  06/19/22 169 lb 12.1 oz (77 kg)  06/12/22 164 lb 10.9 oz (74.7 kg)  05/11/22 164 lb 9.6 oz (74.7 kg)    Constitutional:      Appearance: Not in distress. Chronically ill-appearing.  Pulmonary:     Breath sounds: Bibasilar Rales present.  Cardiovascular:     Normal rate. Irregularly irregular rhythm.     Murmurs: There is no murmur.  Edema:    Peripheral edema (tight bilateral lower ext edema) present.    Pretibial: 2+ edema of the left pretibial area and 3+ edema of the right pretibial area. Abdominal:     Palpations: Abdomen is soft.  Skin:    General: Skin is warm and dry.        ASSESSMENT AND PLAN:   Palpitations He has had several episodes, mainly associated with dialysis, of rapid palpitations and associated shortness of breath. His EKG today still shows atrial fibrillation with controlled rated. He remains on Eliquis. Etiology of his symptoms are not entirely clear. He has had documented low BP. His symptoms could be physiologic and related to hypotension or volume excess. However, he does have a dilated cardiomyopathy. Question if her could be having ventricular tachycardia. He has not had syncope. Labs from the ED a couple of days ago showed normal K+ and normal Hgb. He has issues with low BP and takes Midodrine three times a day every day. He is really on max dose Midodrine. I am hesitant to put him on a beta-blocker on non-dialysis and cause more hypotension. I did d/w our PharmD who suggested considering Metoprolol b/c it would be less likely to cause hypotension. I also reviewed his case with Dr. Elease Hashimoto (attending MD). We decided to hold off on beta-blocker Rx b/c of the concern to cause worse symptoms from more hypotension.  Obtain echocardiogram ASAP to rule out pericardial effusion, worsening EF, worsening MR Obtain 14 day Zio AT to assess rhythm F/u next 2 mos with Dr. Clifton James  HFrEF (heart failure with reduced ejection fraction) (HCC) EF 30-35 on last echocardiogram in 08/2021. He also had mod to severe MR. Volume management per dialysis. His dialysis sessions are getting cut short and he remains volume overloaded. He is not on GDMT due to hx of hypotension. As noted, I discussed with Dr. Elease Hashimoto. We felt that he may benefit from a referral to the AHF Clinic to see if there is anything else that can be done for managing his heart failure, valvular heart disease.  Refer to [facility]  Paroxysmal atrial fibrillation [facility]) He has persistently been in atrial fibrillation since last year. He remains on anticoagulation. Continue Eliquis 5 mg twice daily  (weight > 60 kg, age < 59).   Chronic hypotension He is essentially on max dose Midodrine. Continue current Rx.   ESRD (end stage renal disease) (HCC) He remains on Tues, Thurs, Sat dialysis.   Addendum: The patient returned this afternoon for his echocardiogram. He had several runs of tachycardia during his echocardiogram with HRs up into the 180s. We tried to capture his rhythm on EKG but he was back in AFib with HRs in the 80-90s. I reviewed the rhythm strip that could be seen on the Echocardiogram with Dr. Elease Hashimoto. We are concerned he is having paroxysms of ventricular tachycardia. It is possible he is having rapid AFib as well but the complexes appear wide on the echocardiogram monitor. Given his cardiomyopathy, we think he should start on Amiodarone. This should  control his HR and rhythm better. -Start Amiodarone 200 mg twice daily x 2 weeks, then 200 mg once daily -F/u with me in 2 weeks. -Refer to EP -Zio AT monitor as planned earlier today.     Dispo:  Return in about 2 months (around 08/19/2022) for Routine Follow Up w Dr. Clifton James.  Signed, [name], PA-C

## 2022-06-19 NOTE — Addendum Note (Signed)
Addended by: Virl Axe, Kihanna Kamiya L on: 06/19/2022 05:01 PM   Modules accepted: Orders

## 2022-06-19 NOTE — Progress Notes (Signed)
Patient is in echo after having office visit today with [name] PA. Patient's HR is up at 185 when getting echo. Scott ordered another EKG to be done.

## 2022-06-19 NOTE — Progress Notes (Unsigned)
Enrolled patient for a 14 day Zio XT monitor to be mailed to patients home  [place] to read 

## 2022-06-19 NOTE — Telephone Encounter (Signed)
Pt c/o Shortness Of Breath: STAT if SOB developed within the last 24 hours or pt is noticeably SOB on the phone  1. Are you currently SOB (can you hear that pt is SOB on the phone)?  No   2. How long have you been experiencing SOB?  Occurred on 6/06 and again this morning. Patient's heart started racing and he became SOB/dizzy during dialysis this morning. He was advised to follow up with cardiology ASAP.  3. Are you SOB when sitting or when up moving around?  When laying down during dialysis   4. Are you currently experiencing any other symptoms?  Heart racing, dizziness, sweating   BP: 117/80 50

## 2022-06-19 NOTE — Telephone Encounter (Signed)
Spoke with the pt, on 6/6 while at dialysis was experiencing he  shortness of breat, dizziness, and rapid HR. EMS was called patient was evaluated at [facility] ED. Pt stated he is currently using supplemental oxygen, 2L at baseline and he is still experiencing shortness of breath. Pt stated his symptomatic and was advised to f/u with cardiology. Pt is schedule with APP 6/11@1355 , he voiced understanding.

## 2022-06-20 ENCOUNTER — Encounter (HOSPITAL_COMMUNITY): Payer: Self-pay | Admitting: Radiology

## 2022-06-20 ENCOUNTER — Encounter (HOSPITAL_COMMUNITY): Payer: Self-pay | Admitting: Internal Medicine

## 2022-06-20 DIAGNOSIS — I5020 Unspecified systolic (congestive) heart failure: Secondary | ICD-10-CM

## 2022-06-20 DIAGNOSIS — I132 Hypertensive heart and chronic kidney disease with heart failure and with stage 5 chronic kidney disease, or end stage renal disease: Secondary | ICD-10-CM | POA: Diagnosis present

## 2022-06-20 DIAGNOSIS — Z515 Encounter for palliative care: Secondary | ICD-10-CM | POA: Diagnosis not present

## 2022-06-20 DIAGNOSIS — J9601 Acute respiratory failure with hypoxia: Secondary | ICD-10-CM | POA: Diagnosis not present

## 2022-06-20 DIAGNOSIS — E1151 Type 2 diabetes mellitus with diabetic peripheral angiopathy without gangrene: Secondary | ICD-10-CM | POA: Diagnosis present

## 2022-06-20 DIAGNOSIS — I428 Other cardiomyopathies: Secondary | ICD-10-CM | POA: Diagnosis not present

## 2022-06-20 DIAGNOSIS — I5043 Acute on chronic combined systolic (congestive) and diastolic (congestive) heart failure: Secondary | ICD-10-CM

## 2022-06-20 DIAGNOSIS — T8612 Kidney transplant failure: Secondary | ICD-10-CM | POA: Diagnosis present

## 2022-06-20 DIAGNOSIS — Y830 Surgical operation with transplant of whole organ as the cause of abnormal reaction of the patient, or of later complication, without mention of misadventure at the time of the procedure: Secondary | ICD-10-CM | POA: Diagnosis present

## 2022-06-20 DIAGNOSIS — I4811 Longstanding persistent atrial fibrillation: Secondary | ICD-10-CM

## 2022-06-20 DIAGNOSIS — I4819 Other persistent atrial fibrillation: Secondary | ICD-10-CM | POA: Diagnosis present

## 2022-06-20 DIAGNOSIS — N2581 Secondary hyperparathyroidism of renal origin: Secondary | ICD-10-CM | POA: Diagnosis present

## 2022-06-20 DIAGNOSIS — I472 Ventricular tachycardia, unspecified: Secondary | ICD-10-CM | POA: Diagnosis present

## 2022-06-20 DIAGNOSIS — I420 Dilated cardiomyopathy: Secondary | ICD-10-CM | POA: Diagnosis present

## 2022-06-20 DIAGNOSIS — Z66 Do not resuscitate: Secondary | ICD-10-CM | POA: Diagnosis not present

## 2022-06-20 DIAGNOSIS — D631 Anemia in chronic kidney disease: Secondary | ICD-10-CM | POA: Diagnosis present

## 2022-06-20 DIAGNOSIS — E785 Hyperlipidemia, unspecified: Secondary | ICD-10-CM | POA: Diagnosis present

## 2022-06-20 DIAGNOSIS — I340 Nonrheumatic mitral (valve) insufficiency: Secondary | ICD-10-CM | POA: Diagnosis not present

## 2022-06-20 DIAGNOSIS — I5084 End stage heart failure: Secondary | ICD-10-CM | POA: Diagnosis present

## 2022-06-20 DIAGNOSIS — R002 Palpitations: Secondary | ICD-10-CM | POA: Diagnosis present

## 2022-06-20 DIAGNOSIS — R531 Weakness: Secondary | ICD-10-CM | POA: Diagnosis not present

## 2022-06-20 DIAGNOSIS — I272 Pulmonary hypertension, unspecified: Secondary | ICD-10-CM | POA: Diagnosis present

## 2022-06-20 DIAGNOSIS — Z992 Dependence on renal dialysis: Secondary | ICD-10-CM | POA: Diagnosis not present

## 2022-06-20 DIAGNOSIS — I081 Rheumatic disorders of both mitral and tricuspid valves: Secondary | ICD-10-CM | POA: Diagnosis present

## 2022-06-20 DIAGNOSIS — N186 End stage renal disease: Secondary | ICD-10-CM | POA: Diagnosis present

## 2022-06-20 DIAGNOSIS — I5082 Biventricular heart failure: Secondary | ICD-10-CM | POA: Diagnosis present

## 2022-06-20 DIAGNOSIS — I5023 Acute on chronic systolic (congestive) heart failure: Secondary | ICD-10-CM | POA: Diagnosis not present

## 2022-06-20 DIAGNOSIS — J4489 Other specified chronic obstructive pulmonary disease: Secondary | ICD-10-CM | POA: Diagnosis present

## 2022-06-20 DIAGNOSIS — I471 Supraventricular tachycardia, unspecified: Secondary | ICD-10-CM | POA: Diagnosis not present

## 2022-06-20 DIAGNOSIS — E1122 Type 2 diabetes mellitus with diabetic chronic kidney disease: Secondary | ICD-10-CM | POA: Diagnosis present

## 2022-06-20 DIAGNOSIS — I69354 Hemiplegia and hemiparesis following cerebral infarction affecting left non-dominant side: Secondary | ICD-10-CM | POA: Diagnosis not present

## 2022-06-20 DIAGNOSIS — Z7189 Other specified counseling: Secondary | ICD-10-CM | POA: Diagnosis not present

## 2022-06-20 DIAGNOSIS — I4892 Unspecified atrial flutter: Secondary | ICD-10-CM | POA: Diagnosis present

## 2022-06-20 DIAGNOSIS — R570 Cardiogenic shock: Secondary | ICD-10-CM | POA: Diagnosis not present

## 2022-06-20 DIAGNOSIS — I509 Heart failure, unspecified: Secondary | ICD-10-CM | POA: Diagnosis not present

## 2022-06-20 DIAGNOSIS — I700 Atherosclerosis of aorta: Secondary | ICD-10-CM | POA: Diagnosis present

## 2022-06-20 LAB — BASIC METABOLIC PANEL
Anion gap: 20 — ABNORMAL HIGH (ref 5–15)
BUN: 52 mg/dL — ABNORMAL HIGH (ref 6–20)
CO2: 23 mmol/L (ref 22–32)
Calcium: 8.7 mg/dL — ABNORMAL LOW (ref 8.9–10.3)
Chloride: 92 mmol/L — ABNORMAL LOW (ref 98–111)
Creatinine, Ser: 9.92 mg/dL — ABNORMAL HIGH (ref 0.61–1.24)
GFR, Estimated: 6 mL/min — ABNORMAL LOW (ref >60)
Glucose, Bld: 127 mg/dL — ABNORMAL HIGH (ref 70–99)
Potassium: 5 mmol/L (ref 3.5–5.1)
Sodium: 135 mmol/L (ref 135–145)

## 2022-06-20 LAB — MAGNESIUM
Magnesium: 9.6 mg/dL (ref 1.7–2.4)
Magnesium: 3.1 mg/dL — ABNORMAL HIGH (ref 1.7–2.4)

## 2022-06-20 LAB — GLUCOSE, CAPILLARY
Glucose-Capillary: 126 mg/dL — ABNORMAL HIGH (ref 70–99)
Glucose-Capillary: 51 mg/dL — ABNORMAL LOW (ref 70–99)

## 2022-06-20 LAB — MRSA NEXT GEN BY PCR, NASAL: MRSA by PCR Next Gen: NOT DETECTED

## 2022-06-20 LAB — ALBUMIN: Albumin: 3.4 g/dL — ABNORMAL LOW (ref 3.5–5.0)

## 2022-06-20 LAB — PHOSPHORUS: Phosphorus: 7.9 mg/dL — ABNORMAL HIGH (ref 2.5–4.6)

## 2022-06-20 MED ORDER — SODIUM CHLORIDE 0.9 % IV SOLN: 250.0000 mL | INTRAVENOUS | Status: DC | PRN

## 2022-06-20 MED ORDER — SODIUM CHLORIDE 0.9% FLUSH
3.0000 mL | Freq: Two times a day (BID) | INTRAVENOUS | Status: DC
  Administered 2022-06-20 – 2022-06-28 (×10): 3 mL via INTRAVENOUS

## 2022-06-20 MED ORDER — LIDOCAINE BOLUS VIA INFUSION
50.0000 mg | Freq: Once | INTRAVENOUS | Status: AC
  Administered 2022-06-20: 50 mg via INTRAVENOUS
  Filled 2022-06-20: qty 52

## 2022-06-20 MED ORDER — ASPIRIN 81 MG PO CHEW
81.0000 mg | CHEWABLE_TABLET | ORAL | Status: AC
  Administered 2022-06-21: 81 mg via ORAL
  Filled 2022-06-20: qty 1

## 2022-06-20 MED ORDER — SUCROFERRIC OXYHYDROXIDE 500 MG PO CHEW
1000.0000 mg | CHEWABLE_TABLET | Freq: Two times a day (BID) | ORAL | Status: DC
  Administered 2022-06-20 – 2022-06-27 (×11): 1000 mg via ORAL
  Filled 2022-06-20 (×13): qty 2

## 2022-06-20 MED ORDER — SODIUM CHLORIDE 0.9% FLUSH: 3.0000 mL | INTRAVENOUS | Status: DC | PRN

## 2022-06-20 MED ORDER — MOMETASONE FURO-FORMOTEROL FUM 200-5 MCG/ACT IN AERO
2.0000 | INHALATION_SPRAY | Freq: Two times a day (BID) | RESPIRATORY_TRACT | Status: DC
  Administered 2022-06-20 – 2022-06-27 (×13): 2 via RESPIRATORY_TRACT
  Filled 2022-06-20: qty 8.8

## 2022-06-20 MED ORDER — PRISMASOL BGK 4/2.5 32-4-2.5 MEQ/L REPLACEMENT SOLN: Status: DC

## 2022-06-20 MED ORDER — MIDODRINE HCL 5 MG PO TABS
10.0000 mg | ORAL_TABLET | Freq: Two times a day (BID) | ORAL | Status: DC
  Administered 2022-06-20 – 2022-06-21 (×3): 10 mg via ORAL
  Filled 2022-06-20 (×3): qty 2

## 2022-06-20 MED ORDER — ONDANSETRON HCL 4 MG/2ML IJ SOLN
4.0000 mg | Freq: Four times a day (QID) | INTRAMUSCULAR | Status: DC | PRN
  Filled 2022-06-20: qty 2

## 2022-06-20 MED ORDER — ALBUTEROL SULFATE (2.5 MG/3ML) 0.083% IN NEBU: 2.5000 mg | INHALATION_SOLUTION | Freq: Four times a day (QID) | RESPIRATORY_TRACT | Status: DC | PRN

## 2022-06-20 MED ORDER — LIDOCAINE IN D5W 4-5 MG/ML-% IV SOLN
1.0000 mg/min | INTRAVENOUS | Status: DC
  Administered 2022-06-20 – 2022-06-21 (×2): 1 mg/min via INTRAVENOUS
  Filled 2022-06-20 (×2): qty 500

## 2022-06-20 MED ORDER — HEPARIN SODIUM (PORCINE) 1000 UNIT/ML DIALYSIS
1000.0000 [IU] | INTRAMUSCULAR | Status: DC | PRN
  Administered 2022-06-21 (×2): 4200 [IU] via INTRAVENOUS_CENTRAL
  Filled 2022-06-20 (×3): qty 6
  Filled 2022-06-20: qty 4

## 2022-06-20 MED ORDER — APIXABAN 5 MG PO TABS: 5.0000 mg | ORAL_TABLET | Freq: Two times a day (BID) | ORAL | Status: DC

## 2022-06-20 MED ORDER — MIDODRINE HCL 5 MG PO TABS: 10.0000 mg | ORAL_TABLET | ORAL | Status: DC

## 2022-06-20 MED ORDER — PRISMASOL BGK 4/2.5 32-4-2.5 MEQ/L EC SOLN: Status: DC

## 2022-06-20 MED ORDER — RENA-VITE PO TABS
1.0000 | ORAL_TABLET | Freq: Every day | ORAL | Status: DC
  Administered 2022-06-20 – 2022-06-27 (×8): 1 via ORAL
  Filled 2022-06-20 (×8): qty 1

## 2022-06-20 MED ORDER — DOXERCALCIFEROL 4 MCG/2ML IV SOLN
4.0000 ug | INTRAVENOUS | Status: DC
  Filled 2022-06-20: qty 2

## 2022-06-20 MED ORDER — ACETAMINOPHEN 325 MG PO TABS: 650.0000 mg | ORAL_TABLET | ORAL | Status: DC | PRN

## 2022-06-20 MED ORDER — CHLORHEXIDINE GLUCONATE CLOTH 2 % EX PADS
6.0000 | MEDICATED_PAD | Freq: Every day | CUTANEOUS | Status: DC
  Administered 2022-06-20 – 2022-06-28 (×12): 6 via TOPICAL

## 2022-06-20 MED ORDER — ORAL CARE MOUTH RINSE: 15.0000 mL | OROMUCOSAL | Status: DC | PRN

## 2022-06-20 MED ORDER — SODIUM CHLORIDE 0.9 % IV SOLN: INTRAVENOUS | Status: DC

## 2022-06-20 MED ORDER — SODIUM CHLORIDE 0.9% FLUSH
3.0000 mL | Freq: Two times a day (BID) | INTRAVENOUS | Status: DC
  Administered 2022-06-20 – 2022-06-27 (×13): 3 mL via INTRAVENOUS

## 2022-06-20 MED ORDER — ALBUTEROL SULFATE HFA 108 (90 BASE) MCG/ACT IN AERS: 2.0000 | INHALATION_SPRAY | Freq: Four times a day (QID) | RESPIRATORY_TRACT | Status: DC | PRN

## 2022-06-20 NOTE — Progress Notes (Signed)
This RN attempted to get report from ED on 6/12 at 0051.  Younique Casad Berneta Levins, RN

## 2022-06-20 NOTE — Progress Notes (Signed)
Patient scheduled for STAT add on echocardiogram due shortness of breath, palpitations and rule out pericardial effusion. Patient arrived very short breath, complaining chest pain and without oxygen. Patient was give oxygen on arrive in treatment area. During exam, patient had multiple runs of tachycardia with increase shortness of breath and unable to lie down. [name] and Dr. Elease Hashimoto notified of patient's condition. Patient was assessed by both providers and EKG was performed. Patient was handed off to providers post echocardiogram.

## 2022-06-20 NOTE — H&P (Signed)
Cardiology Admission History and Physical   Patient ID: [name] MRN: [id number]; DOB: April 19, 1960   Admission date: 06/19/2022  PCP:  Claiborne Rigg, NP   [facility] Providers Cardiologist:  Verne Carrow, MD        Chief Complaint: Palpitations  Patient Profile:   Nathaniel White is a 62 y.o. male with ESRD on dialysis Tuesday, Thursday, and Saturday, HFrEF with EF of 30 to 35%, paroxysmal atrial fibrillation, and chronic hypertension who is being seen 06/20/2022 for the evaluation of wide-complex tachycardia.  History of Present Illness:   Mr. Facenda is a 62 year old male with ESRD and HFrEF who presents to the emergency department via EMS with palpitations and found to be in a wide-complex tachycardia concerning for VT.  Notes that he has not been having full fluid removal at the past few dialysis sessions.  This is mostly due to his hypotension.  Thursday of last week he had 1 to 1.5 L taken off and he said he felt poorly after this.  And then he had a limited fluid removal last Saturday.  Today he had 1 L taken off and began having intermittent palpitations and shortness of breath.  He was seen in the office and was sent for an urgent echo and started on amiodarone p.o.  On his echo noted to have EF of 30 to 35% and severe MR secondary to any restricted posterior leaflet and poor coaptation.  On arrival he had low blood pressures.  He was intermittently going into runs of VT that were lasting for 1 to 2 minutes.  1 of these captured on ECG.  Does not appear to be A-fib with RVR.  He was started on IV amiodarone infusion in the emergency department and cardiology was called for admission.   Past Medical History:  Diagnosis Date   Anemia    Anxiety    Asthma    CHF (congestive heart failure) (HCC)    Complication from renal dialysis device 11/02/2013   Complication of anesthesia 2017   according to pt and spouse pt was moving around and cough while  under and pt had difficulty waking up so the anesthesia had to be reversed. and pt admitted to ICU.    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/2008   Qualifier: Diagnosis of  By: Russella Dar MD Bronson Curb T    ESRD    on HD, T-TH-Sat - [place]   Hemiparesis due to old stroke [facility])    left   Hypertension    Hx, not current problems, no meds   LV dysfunction    EF 25-30% by echo 07/2011   Memory loss due to medical condition    due to stroke   MR (mitral regurgitation)    moderate to severe, echo 07/2011   Nausea with vomiting, unspecified 06/15/2021   Paroxysmal atrial fibrillation (HCC)    Peripheral vascular disease (HCC)    Shortness of breath    with exertion   Stroke (HCC)    TIA's-left sided weakness   Tobacco abuse     Past Surgical History:  Procedure Laterality Date   ABDOMINAL AORTOGRAM W/LOWER EXTREMITY Bilateral 07/21/2018   Procedure: ABDOMINAL AORTOGRAM W/LOWER EXTREMITY;  Surgeon: Maeola Harman, MD;  Location: [facility] INVASIVE CV LAB;  Service: Cardiovascular;  Laterality: Bilateral;   AMPUTATION Left 11/05/2018   Procedure: AMPUTATION SECOND TOE LEFT FOOT;  Surgeon: Maeola Harman, MD;  Location[facility] OR;  Service: Vascular;  Laterality: Left;   AMPUTATION Left 02/13/2021   Procedure: Left small finger AMPUTATION DIGIT;  Surgeon: Marlyne Beards, MD;  Location: [facility] OR;  Service: Orthopedics;  Laterality: Left;   AV FISTULA PLACEMENT     CARDIAC CATHETERIZATION     [place] [facility]   COLONOSCOPY W/ BIOPSIES AND POLYPECTOMY     FISTULA SUPERFICIALIZATION Left 11/10/2013   Procedure: FISTULA PLICATION;  Surgeon: Nada Libman, MD;  Location: [facility] OR;  Service: Vascular;  Laterality: Left;   INSERTION OF DIALYSIS CATHETER N/A  06/20/2021   Procedure: INSERTION OF DIALYSIS CATHETER;  Surgeon: Victorino Sparrow, MD;  Location: [facility] OR;  Service: Vascular;  Laterality: N/A;   KIDNEY TRANSPLANT     2011 rejected kidney 2012 back on dialysis   LEFT AND RIGHT HEART CATHETERIZATION WITH CORONARY ANGIOGRAM N/A 10/09/2013   Procedure: LEFT AND RIGHT HEART CATHETERIZATION WITH CORONARY ANGIOGRAM;  Surgeon: Kathleene Hazel, MD;  Location: [facility] CATH LAB;  Service: Cardiovascular;  Laterality: N/A;   LIGATION OF ARTERIOVENOUS  FISTULA Left 06/20/2021   Procedure: LIGATION OF ARTERIOVENOUS  FISTULA;  Surgeon: Victorino Sparrow, MD;  Location: [facility] OR;  Service: Vascular;  Laterality: Left;   PERIPHERAL VASCULAR INTERVENTION Left 07/21/2018   Procedure: PERIPHERAL VASCULAR INTERVENTION;  Surgeon: Maeola Harman, MD;  Location: [facility] INVASIVE CV LAB;  Service: Cardiovascular;  Laterality: Left;  Popliteal   REVISON OF ARTERIOVENOUS FISTULA Left 08/26/2013   Procedure: EXCISION OF ERODED SKIN AND EXPLORATION OF MAIN LEFT UPPER ARM AV FISTULA;  Surgeon: Larina Earthly, MD;  Location: [facility] OR;  Service: Vascular;  Laterality: Left;   REVISON OF ARTERIOVENOUS FISTULA Left 02/05/2014   Procedure: REPAIR OF ARTERIOVENOUS FISTULA ANEURYSM;  Surgeon: Nada Libman, MD;  Location: [facility] OR;  Service: Vascular;  Laterality: Left;   REVISON OF ARTERIOVENOUS FISTULA Left 03/10/2021   Procedure: LEFT ARM REVISION OF ARTERIOVENOUS FISTULA WITH PLICATION;  Surgeon: Maeola Harman, MD;  Location: [facility] OR;  Service: Vascular;  Laterality: Left;   SHUNTOGRAM N/A 11/05/2012   Procedure: Fistulogram;  Surgeon: Nada Libman, MD;  Location: [facility] CATH LAB;  Service: Cardiovascular;  Laterality: N/A;   TEE WITHOUT CARDIOVERSION  08/17/2011   Procedure: TRANSESOPHAGEAL ECHOCARDIOGRAM (TEE);  Surgeon: Laurey Morale, MD;  Location: [facility] ENDOSCOPY;  Service: Cardiovascular;  Laterality: N/A;   ULTRASOUND GUIDANCE FOR VASCULAR ACCESS  06/20/2021   Procedure:  ULTRASOUND GUIDANCE FOR VASCULAR ACCESS;  Surgeon: Victorino Sparrow, MD;  Location: [facility] OR;  Service: Vascular;;   VIDEO ASSISTED THORACOSCOPY (VATS)/DECORTICATION  08/10/11     Medications Prior to Admission: Prior to Admission medications   Medication Sig Start Date End Date Taking? Authorizing Provider  albuterol (PROAIR HFA) 108 (90 Base) MCG/ACT inhaler Inhale 2 puffs into the lungs every 6 (six) hours as needed for wheezing or shortness of breath. 08/25/21  Yes Hoy Register, MD  albuterol (PROVENTIL) (2.5 MG/3ML) 0.083% nebulizer solution Take 3 mLs (2.5 mg total) by nebulization every 6 (six) hours as needed for wheezing or shortness of breath. 08/25/21  Yes Hoy Register, MD  amiodarone (PACERONE) 200 MG tablet Take 1 tablet (200 mg total) by mouth 2 (two) times daily. For 2 weeks, then take one tablet by mouth daily. 06/19/22  Yes [name] T, PA-C  ELIQUIS 5 MG TABS tablet Take 5 mg by mouth 2 (two) times daily. 12/09/21  Yes [provider]  Ferrous Sulfate (IRON PO) Take 1 tablet by mouth in the morning, at noon, and at bedtime.   Yes [provider]  fluticasone-salmeterol (ADVAIR DISKUS) 250-50 MCG/ACT AEPB Inhale 1 puff into the lungs in the morning and at bedtime. NEEDS PASS 08/09/21  Yes Claiborne Rigg, NP  midodrine (PROAMATINE) 10 MG tablet Take 10 mg by mouth See admin instructions. Take 1 tablet by mouth twice a day every Tuesday, Thursday and Saturday 06/18/22  Yes [provider]  midodrine (PROAMATINE) 5 MG tablet Take one tablet by mouth 3 times daily on non-dialysis days, Mon, Wed, Fri and Sun. Patient taking differently: Take one tablet by mouth 3 times daily on non-dialysis days, Mon, Wed, Fri and Sun. 02/19/22  Yes Conte, Tessa N, PA-C  sildenafil (VIAGRA) 100 MG tablet Take 0.5-1 tablets (50-100 mg total) by mouth daily as needed for erectile dysfunction. 05/11/22  Yes Claiborne Rigg, NP  VELPHORO 500 MG chewable tablet Chew 1,000 mg by mouth  2 (two) times daily after a meal. 02/06/21  Yes [provider]  VISINE 0.05 % ophthalmic solution Place 2-3 drops into both eyes 2 (two) times daily as needed (dry eye). 07/04/21  Yes [provider]  acetaminophen (TYLENOL) 500 MG tablet Take 1 tablet (500 mg total) by mouth every 6 (six) hours as needed. Patient not taking: Reported on 06/19/2022 04/11/18   Linus Mako B, NP  hydrOXYzine (ATARAX) 10 MG tablet Take 1 tablet (10 mg total) by mouth 3 (three) times daily as needed. For anxiety Patient not taking: Reported on 06/19/2022 06/07/21   Claiborne Rigg, NP  montelukast (SINGULAIR) 10 MG tablet Take 1 tablet (10 mg total) by mouth at bedtime. Patient not taking: Reported on 06/19/2022 06/07/21   Claiborne Rigg, NP     Allergies:    Allergies  Allergen Reactions   Codeine Shortness Of Breath   Dextromethorphan-Guaifenesin Shortness Of Breath and Other (See Comments)   Iron Dextran Shortness Of Breath and Other (See Comments)   Morphine And Codeine Shortness Of Breath    Other Reaction(s): Other (See Comments)    Social History:   Social History   Socioeconomic History   Marital status: Single    Spouse name: Not on file   Number of children: 1   Years of education: Not on file   Highest education level: Not on file  Occupational History   Occupation: Nurse, adult and Public librarian: DISABLED  Tobacco Use   Smoking status: Former    Packs/day: 0.50    Years: 15.00    Additional pack years: 0.00    Total pack years: 7.50    Types: Cigarettes    Quit date: 2014    Years since quitting: 10.4   Smokeless tobacco: Never  Vaping Use   Vaping Use: Never used  Substance and Sexual Activity   Alcohol use: No    Alcohol/week: 0.0 standard drinks of alcohol   Drug use: No   Sexual activity: Not Currently  Other Topics Concern   Not on file  Social History Narrative   Not on file   Social Determinants of Health   Financial Resource Strain: Not  on file  Food Insecurity: Not on file  Transportation Needs: Not on file  Physical Activity: Not on file  Stress: Not on file  Social Connections: Not on  file  Intimate Partner Violence: Not on file    Family History:   The patient's family history includes CAD in his paternal uncle; Cancer in his paternal grandfather; Diabetes in his paternal grandmother; Hypertension in his mother; Varicose Veins in his mother.    ROS:  Please see the history of present illness.  All other ROS reviewed and negative.     Physical Exam/Data:   Vitals:   06/19/22 2250 06/19/22 2255 06/19/22 2300 06/19/22 2330  BP: (!) 101/44  98/72 98/62  Pulse:   (!) 40   Resp: (!) 32  (!) 27 18  SpO2:   (!) 85%   Weight:  77 kg    Height:  5\' 9"  (1.753 m)      Intake/Output Summary (Last 24 hours) at 06/20/2022 0033 Last data filed at 06/20/2022 0009 Gross per 24 hour  Intake 46.61 ml  Output --  Net 46.61 ml      06/19/2022   10:55 PM 06/19/2022    1:20 PM 06/12/2022    1:48 PM  Last 3 Weights  Weight (lbs) 169 lb 12.1 oz 169 lb 12.1 oz 164 lb 10.9 oz  Weight (kg) 77 kg 77 kg 74.7 kg     Body mass index is 25.07 kg/m.  General:  W no acute distress  HEENT: normal Neck: Elevated JVD mid neck Vascular: No carotid bruits; Distal pulses 2+ bilaterally   Cardiac:  normal S1, S2; RRR; soft systolic murmur Lungs:  clear to auscultation bilaterally, no wheezing, rhonchi or rales  Abd: soft, nontender, no hepatomegaly  Ext: no edema Musculoskeletal:  No deformities, BUE and BLE strength normal and equal Skin: warm and dry  Neuro:  CNs 2-12 intact, no focal abnormalities noted Psych:  Normal affect    EKG:    Relevant CV Studies: Echo 06/19/22:  1. Left ventricular ejection fraction, by estimation, is 30 to 35%. The  left ventricle has moderately decreased function. The left ventricle has  no regional wall motion abnormalities. Left ventricular diastolic function  could not be evaluated.   2. Right  ventricular systolic function is mildly reduced. The right  ventricular size is mildly enlarged.   3. Left atrial size was severely dilated.   4. Right atrial size was mildly dilated.   5. The mitral valve is abnormal. Moderate to severe mitral valve  regurgitation due to poor leaflet coaptation and a restricted posterior  leaflet. By quantitative measures, MR is severe with an EROA of 0.54cm2  and regurgitant volume of 72ml.   6. The aortic valve is normal in structure. Aortic valve regurgitation is  not visualized. No aortic stenosis is present.   7. The inferior vena cava is normal in size with greater than 50%  respiratory variability, suggesting right atrial pressure of 3 mmHg.    Laboratory Data:  High Sensitivity Troponin:   Recent Labs  Lab 06/12/22 1405 06/12/22 1527  TROPONINIHS 89* 92*      Chemistry Recent Labs  Lab 06/14/22 1230  NA 135  K 4.3  CL 93*  CO2 26  GLUCOSE 73  BUN 34*  CREATININE 8.66*  CALCIUM 8.9  GFRNONAA 7*  ANIONGAP 16*    No results for input(s): "PROT", "ALBUMIN", "AST", "ALT", "ALKPHOS", "BILITOT" in the last 168 hours. Lipids No results for input(s): "CHOL", "TRIG", "HDL", "LABVLDL", "LDLCALC", "CHOLHDL" in the last 168 hours. Hematology Recent Labs  Lab 06/14/22 1230 06/19/22 2242  WBC 6.3 5.5  RBC 5.45 4.35  HGB 16.0  12.9*  HCT 51.2 42.1  MCV 93.9 96.8  MCH 29.4 29.7  MCHC 31.3 30.6  RDW 16.8* 17.2*  PLT 84* 64*   Thyroid  Recent Labs  Lab 06/19/22 2242  TSH 2.460   BNPNo results for input(s): "BNP", "PROBNP" in the last 168 hours.  DDimer No results for input(s): "DDIMER" in the last 168 hours.   Radiology/Studies:  DG Chest Portable 1 View  Result Date: 06/19/2022 CLINICAL DATA:  Shortness of breath, chest pain and irregular heart beat. EXAM: PORTABLE CHEST 1 VIEW COMPARISON:  June 12, 2022 FINDINGS: There is stable left-sided venous catheter positioning. Stable moderate to marked severity enlargement of the  cardiac silhouette is seen. Stable mild to moderate severity diffusely increased interstitial lung markings are present with stable mild to moderate severity areas of bibasilar atelectasis. There is a small, stable right pleural effusion. No pneumothorax is identified. The visualized skeletal structures are unremarkable. IMPRESSION: 1. Stable cardiomegaly and mild to moderate severity interstitial edema with stable mild to moderate severity bibasilar atelectasis. 2. Small, stable right pleural effusion. Electronically Signed   By: Aram Candela M.D.   On: 06/19/2022 23:16   ECHOCARDIOGRAM LIMITED  Result Date: 06/19/2022    ECHOCARDIOGRAM LIMITED REPORT   Patient Name:   Nathaniel White Date of Exam: 06/19/2022 Medical Rec #:  [id number]       Height:       69.0 in Accession #:    [id number]      Weight:       169.8 lb Date of Birth:  April 05, 1960       BSA:          1.927 m Patient Age:    61 years        BP:           138/60 mmHg Patient Gender: M               HR:           95 bpm. Exam Location:  [facility] Procedure: Limited Echo, Limited Color Doppler and Cardiac Doppler Indications:    SOB (shortness of breath) [R06.02]; Pericardial effusion  History:        Patient has prior history of Echocardiogram examinations, most                 recent 09/05/2021. CHF, COPD and Stroke, Arrythmias:Atrial                 Fibrillation; Risk Factors:Diabetes and Hypertension.  Sonographer:    Thurman Coyer RDCS Referring Phys: [id number] Evern Bio WEAVER IMPRESSIONS  1. Left ventricular ejection fraction, by estimation, is 30 to 35%. The left ventricle has moderately decreased function. The left ventricle has no regional wall motion abnormalities. Left ventricular diastolic function could not be evaluated.  2. Right ventricular systolic function is mildly reduced. The right ventricular size is mildly enlarged.  3. Left atrial size was severely dilated.  4. Right atrial size was mildly dilated.  5. The mitral valve is  abnormal. Moderate to severe mitral valve regurgitation due to poor leaflet coaptation and a restricted posterior leaflet. By quantitative measures, MR is severe with an EROA of 0.54cm2 and regurgitant volume of 72ml.  6. The aortic valve is normal in structure. Aortic valve regurgitation is not visualized. No aortic stenosis is present.  7. The inferior vena cava is normal in size with greater than 50% respiratory variability, suggesting right atrial pressure of 3 mmHg. FINDINGS  Left  Ventricle: Left ventricular ejection fraction, by estimation, is 30 to 35%. The left ventricle has moderately decreased function. The left ventricle has no regional wall motion abnormalities. The left ventricular internal cavity size was normal in size. There is borderline concentric left ventricular hypertrophy. Left ventricular diastolic function could not be evaluated. Left ventricular diastolic function could not be evaluated due to atrial fibrillation. Right Ventricle: The right ventricular size is mildly enlarged. No increase in right ventricular wall thickness. Right ventricular systolic function is mildly reduced. Left Atrium: Left atrial size was severely dilated. Right Atrium: Right atrial size was mildly dilated. Pericardium: There is no evidence of pericardial effusion. Mitral Valve: The mitral valve is abnormal. Moderate to severe mitral valve regurgitation. No evidence of mitral valve stenosis. Tricuspid Valve: The tricuspid valve is normal in structure. Tricuspid valve regurgitation is trivial. No evidence of tricuspid stenosis. Aortic Valve: The aortic valve is normal in structure. Aortic valve regurgitation is not visualized. No aortic stenosis is present. Pulmonic Valve: The pulmonic valve was normal in structure. Pulmonic valve regurgitation is not visualized. No evidence of pulmonic stenosis. Aorta: The aortic root is normal in size and structure. Venous: The inferior vena cava is normal in size with greater than  50% respiratory variability, suggesting right atrial pressure of 3 mmHg. IAS/Shunts: No atrial level shunt detected by color flow Doppler. LEFT VENTRICLE PLAX 2D LVIDd:         5.80 cm LVIDs:         4.40 cm LV PW:         1.10 cm LV IVS:        1.00 cm LVOT diam:     2.00 cm LVOT Area:     3.14 cm  LEFT ATRIUM            Index LA diam:      5.00 cm  2.59 cm/m LA Vol (A4C): 112.0 ml 58.12 ml/m   AORTA Ao Root diam: 3.50 cm Ao Asc diam:  3.50 cm MR Peak grad:    79.6 mmHg    TRICUSPID VALVE MR Mean grad:    51.0 mmHg    TR Peak grad:   56.0 mmHg MR Vmax:         446.00 cm/s  TR Vmax:        374.00 cm/s MR Vmean:        334.0 cm/s MR PISA:         7.60 cm     SHUNTS MR PISA Eff ROA: 54 mm       Systemic Diam: 2.00 cm MR PISA Radius:  1.10 cm Aditya Sabharwal Electronically signed by Dorthula Nettles Signature Date/Time: 06/19/2022/5:32:22 PM    Final      Assessment and Plan:   #.  Ventricular tachycardia # HFrEF with EF 30 to 35% # Severe MR Is likely within VT is resultant of limited ability to hold fluid over the last 1 to 2 weeks.  Patient notes a 7 pound weight gain.  Currently infusing amiodarone for VT therapy.  Upon assessment of telemetry and ECG appears that this is VT and has northwest axis and very regular.  Seems to be triggered and is soft by PVCs.  If he has recurrence or persistent VT then we will add lidocaine infusion.  Monitoring closely in the ICU.  He will need to have some fluid removal as much as tolerated however his blood pressure will be challenging.  Will need to consult renal and have  them on board for volume management.  Also given severity of heart dysfunction and volume status, should have discussion about intervention for his mitral valve and whether that could be MitraClip.  This may help him with symptom management.  GDMT is limited by his hypertension.  -Continue amiodarone infusion for 10 g load -Will need to consult nephrology -Will need to discuss whether mitral  valve intervention is indicated  # Hypotension, chronic Continue midodrine  # ESRD with HD on TThSa Consult nephrology  # Paroxysmal atrial fibrillation CHA2DS2-VASc score 6.  On chronic anticoagulation with apixaban.  Amiodarone as above.   Risk Assessment/Risk Scores:       New York Heart Association (NYHA) Functional Class NYHA Class III  CHA2DS2-VASc Score = 6   This indicates a 9.7% annual risk of stroke. The patient's score is based upon: CHF History: 1 HTN History: 1 Diabetes History: 1 Stroke History: 2 Vascular Disease History: 1 Age Score: 0 Gender Score: 0      Severity of Illness: The appropriate patient status for this patient is INPATIENT. Inpatient status is judged to be reasonable and necessary in order to provide the required intensity of service to ensure the patient's safety. The patient's presenting symptoms, physical exam findings, and initial radiographic and laboratory data in the context of their chronic comorbidities is felt to place them at high risk for further clinical deterioration. Furthermore, it is not anticipated that the patient will be medically stable for discharge from the hospital within 2 midnights of admission.   * I certify that at the point of admission it is my clinical judgment that the patient will require inpatient hospital care spanning beyond 2 midnights from the point of admission due to high intensity of service, high risk for further deterioration and high frequency of surveillance required.*   For questions or updates, please contact [facility] Please consult [email or web address] for contact info under     Signed, Joellen Jersey, MD  06/20/2022 12:33 AM

## 2022-06-20 NOTE — Consult Note (Addendum)
Renal Service Consult Note [name] 06/20/2022 [name], MD Requesting Physician: Dr. Wyline Mood, Judie Petit.   Reason for Consult: ESRD pt w/ VT  HPI: The patient is a 62 y.o. year-old w/ PMH as below who presented to ED last night from home w/ c/o CP and SOB. EMS noted V tach and started IV amiodarone. In ED pt had WCT. Pt noted didn't get much fluid off the last few HD sessions due to low BP's. ECHO done showing EF 30-35% w/ severe MR. BP's were soft. Was having VT runs lasting 1-2 minutes in ED. Started on IV amio gtt. Admitted to cardiology. Overnight IV lidocaine was added. Pt is on HD TTS schedule w his last HD yesterday.  We are asked to see for dialysis.   Pt seen in ICU. Pt was lying flat and grasping for air, asking to sit up. We set him mostly upright and SpO2's improved from high 80s to low 90s.  Pt w/o c/o's except for SOB.   ROS - denies CP, no joint pain, no HA, no blurry vision, no rash, no diarrhea, no nausea/ vomiting, no dysuria, no difficulty voiding   Past Medical History  Past Medical History:  Diagnosis Date   Anemia    Anxiety    Asthma    CHF (congestive heart failure) (HCC)    Complication from renal dialysis device 11/02/2013   Complication of anesthesia 2017   according to pt and spouse pt was moving around and cough while under and pt had difficulty waking up so the anesthesia had to be reversed. and pt admitted to ICU.    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/2008   Qualifier: Diagnosis of  By: Russella Dar MD Bronson Curb T    ESRD    on HD, T-TH-Sat - [place]   Hemiparesis due to old stroke [facility])    left   Hypertension    Hx, not current problems, no meds   LV dysfunction    EF 25-30% by echo  07/2011   Memory loss due to medical condition    due to stroke   MR (mitral regurgitation)    moderate to severe, echo 07/2011   Nausea with vomiting, unspecified 06/15/2021   Paroxysmal atrial fibrillation (HCC)    Peripheral vascular disease (HCC)    Shortness of breath    with exertion   Stroke (HCC)    TIA's-left sided weakness   Tobacco abuse    Past Surgical History  Past Surgical History:  Procedure Laterality Date   ABDOMINAL AORTOGRAM W/LOWER EXTREMITY Bilateral 07/21/2018   Procedure: ABDOMINAL AORTOGRAM W/LOWER EXTREMITY;  Surgeon: Maeola Harman, MD;  Location: [facility] INVASIVE CV LAB;  Service: Cardiovascular;  Laterality: Bilateral;   AMPUTATION Left 11/05/2018   Procedure: AMPUTATION SECOND TOE LEFT FOOT;  Surgeon: Maeola Harman, MD;  Location: [facility] OR;  Service: Vascular;  Laterality: Left;   AMPUTATION Left 02/13/2021   Procedure: Left small finger AMPUTATION DIGIT;  Surgeon: Marlyne Beards, MD;  Location: [facility] OR;  Service: Orthopedics;  Laterality: Left;   AV FISTULA PLACEMENT     CARDIAC CATHETERIZATION     [facility]   COLONOSCOPY W/ BIOPSIES AND POLYPECTOMY     FISTULA SUPERFICIALIZATION Left 11/10/2013  Procedure: FISTULA PLICATION;  Surgeon: Nada Libman, MD;  Location: [facility] OR;  Service: Vascular;  Laterality: Left;   INSERTION OF DIALYSIS CATHETER N/A 06/20/2021   Procedure: INSERTION OF DIALYSIS CATHETER;  Surgeon: Victorino Sparrow, MD;  Location: [facility] OR;  Service: Vascular;  Laterality: N/A;   KIDNEY TRANSPLANT     2011 rejected kidney 2012 back on dialysis   LEFT AND RIGHT HEART CATHETERIZATION WITH CORONARY ANGIOGRAM N/A 10/09/2013   Procedure: LEFT AND RIGHT HEART CATHETERIZATION WITH CORONARY ANGIOGRAM;  Surgeon: Kathleene Hazel, MD;  Location: [facility] CATH LAB;  Service: Cardiovascular;  Laterality: N/A;   LIGATION OF ARTERIOVENOUS  FISTULA Left 06/20/2021   Procedure: LIGATION OF ARTERIOVENOUS  FISTULA;  Surgeon: Victorino Sparrow, MD;  Location: [facility] OR;  Service: Vascular;  Laterality: Left;   PERIPHERAL VASCULAR INTERVENTION Left 07/21/2018   Procedure: PERIPHERAL VASCULAR INTERVENTION;  Surgeon: Maeola Harman, MD;  Location: [facility] INVASIVE CV LAB;  Service: Cardiovascular;  Laterality: Left;  Popliteal   REVISON OF ARTERIOVENOUS FISTULA Left 08/26/2013   Procedure: EXCISION OF ERODED SKIN AND EXPLORATION OF MAIN LEFT UPPER ARM AV FISTULA;  Surgeon: Larina Earthly, MD;  Location: [facility] OR;  Service: Vascular;  Laterality: Left;   REVISON OF ARTERIOVENOUS FISTULA Left 02/05/2014   Procedure: REPAIR OF ARTERIOVENOUS FISTULA ANEURYSM;  Surgeon: Nada Libman, MD;  Location: [facility] OR;  Service: Vascular;  Laterality: Left;   REVISON OF ARTERIOVENOUS FISTULA Left 03/10/2021   Procedure: LEFT ARM REVISION OF ARTERIOVENOUS FISTULA WITH PLICATION;  Surgeon: Maeola Harman, MD;  Location: [facility] OR;  Service: Vascular;  Laterality: Left;   SHUNTOGRAM N/A 11/05/2012   Procedure: Fistulogram;  Surgeon: Nada Libman, MD;  Location: [facility] CATH LAB;  Service: Cardiovascular;  Laterality: N/A;   TEE WITHOUT CARDIOVERSION  08/17/2011   Procedure: TRANSESOPHAGEAL ECHOCARDIOGRAM (TEE);  Surgeon: Laurey Morale, MD;  Location: [facility] ENDOSCOPY;  Service: Cardiovascular;  Laterality: N/A;   ULTRASOUND GUIDANCE FOR VASCULAR ACCESS  06/20/2021   Procedure: ULTRASOUND GUIDANCE FOR VASCULAR ACCESS;  Surgeon: Victorino Sparrow, MD;  Location: [facility] OR;  Service: Vascular;;   VIDEO ASSISTED THORACOSCOPY (VATS)/DECORTICATION  08/10/11   Family History  Family History  Problem Relation Age of Onset   Hypertension Mother    Varicose Veins Mother    Diabetes Paternal Grandmother    Cancer Paternal Grandfather    CAD Paternal Uncle    Social History  reports that he quit smoking about 10 years ago. His smoking use included cigarettes. He has a 7.50 pack-year smoking history. He has never used smokeless tobacco. He reports that he does not drink  alcohol and does not use drugs. Allergies  Allergies  Allergen Reactions   Codeine Shortness Of Breath   Dextromethorphan-Guaifenesin Shortness Of Breath and Other (See Comments)   Iron Dextran Shortness Of Breath and Other (See Comments)   Morphine And Codeine Shortness Of Breath    Other Reaction(s): Other (See Comments)   Home medications Prior to Admission medications   Medication Sig Start Date End Date Taking? Authorizing Provider  albuterol (PROAIR HFA) 108 (90 Base) MCG/ACT inhaler Inhale 2 puffs into the lungs every 6 (six) hours as needed for wheezing or shortness of breath. 08/25/21  Yes Hoy Register, MD  albuterol (PROVENTIL) (2.5 MG/3ML) 0.083% nebulizer solution Take 3 mLs (2.5 mg total) by nebulization every 6 (six) hours as needed for wheezing or shortness of breath. 08/25/21  Yes Hoy Register, MD  amiodarone (PACERONE) 200 MG  tablet Take 1 tablet (200 mg total) by mouth 2 (two) times daily. For 2 weeks, then take one tablet by mouth daily. 06/19/22  Yes Weaver, Scott T, PA-C  ELIQUIS 5 MG TABS tablet Take 5 mg by mouth 2 (two) times daily. 12/09/21  Yes [provider]  Ferrous Sulfate (IRON PO) Take 1 tablet by mouth in the morning, at noon, and at bedtime.   Yes [provider]  fluticasone-salmeterol (ADVAIR DISKUS) 250-50 MCG/ACT AEPB Inhale 1 puff into the lungs in the morning and at bedtime. NEEDS PASS 08/09/21  Yes Claiborne Rigg, NP  midodrine (PROAMATINE) 10 MG tablet Take 10 mg by mouth See admin instructions. Take 1 tablet by mouth twice a day every Tuesday, Thursday and Saturday 06/18/22  Yes [provider]  midodrine (PROAMATINE) 5 MG tablet Take one tablet by mouth 3 times daily on non-dialysis days, Mon, Wed, Fri and Sun. Patient taking differently: Take one tablet by mouth 3 times daily on non-dialysis days, Mon, Wed, Fri and Sun. 02/19/22  Yes Conte, Tessa N, PA-C  sildenafil (VIAGRA) 100 MG tablet Take 0.5-1 tablets (50-100 mg  total) by mouth daily as needed for erectile dysfunction. 05/11/22  Yes Claiborne Rigg, NP  VELPHORO 500 MG chewable tablet Chew 1,000 mg by mouth 2 (two) times daily after a meal. 02/06/21  Yes [provider]  VISINE 0.05 % ophthalmic solution Place 2-3 drops into both eyes 2 (two) times daily as needed (dry eye). 07/04/21  Yes [provider]  acetaminophen (TYLENOL) 500 MG tablet Take 1 tablet (500 mg total) by mouth every 6 (six) hours as needed. Patient not taking: Reported on 06/19/2022 04/11/18   Linus Mako B, NP  hydrOXYzine (ATARAX) 10 MG tablet Take 1 tablet (10 mg total) by mouth 3 (three) times daily as needed. For anxiety Patient not taking: Reported on 06/19/2022 06/07/21   Claiborne Rigg, NP  montelukast (SINGULAIR) 10 MG tablet Take 1 tablet (10 mg total) by mouth at bedtime. Patient not taking: Reported on 06/19/2022 06/07/21   Claiborne Rigg, NP     Vitals:   06/20/22 0815 06/20/22 0829 06/20/22 0830 06/20/22 0900  BP: 108/71  109/74 112/75  Pulse: 63  63 72  Resp: 17  19 (!) 22  Temp:      TempSrc:      SpO2: 95% 94% 93% 96%  Weight:      Height:       Exam Gen alert, mild ^wob, sitting up straight in bed (couldn't breath lying down) No rash, cyanosis or gangrene Sclera anicteric, throat clear  No jvd or bruits Chest diffuse rales 1/2 to 2/3 up bilaterally RRR no RG Abd soft ntnd no mass or ascites +bs GU normal male MS no joint effusions or deformity Ext bilat 1+ pitting LE edema, no wounds or ulcers Neuro is alert, Ox 3 , nf        Home meds include - singulair, albuterol, amiodarone, eliquis, advair diskus, midodrine 10mg  tid on non-hd days, velphoro 1 gm tid ac, prns/ vits/ supps     OP HD: SW TTS 4h  450/ 500   70.5kg  2/2 bath  TDC  Hep none - last OP HD 6/11, post wt 77.2kg   - coming off 3-6 kg over last 2 wks, progressive - hectorol 4 mcg IV tiw - parsabiv 7.5mg  IV tiw   CXR 6/11 - IMPRESSION: Stable cardiomegaly and  mild to moderate severity interstitial edema  with stable mild to moderate severity bibasilar atelectasis.   Assessment/ Plan: V tach - new onset, assoc w/ low BP's, low EF 30-35% and severe MR. Getting IV amio and lidocaine gtt's in ICU. VT mostly under control. Per primary team.  SOB/ acute hypoxic resp failure / pulm edema - will start CRRT this am asap, get vol down as BP's tolerate.  Hypotension - acute on chronic, cont midodrine here and will ^ to bid every day. Will likely need pressor support.  ESRD - on HD TTS. Last HD yesterday. CRRT to start today.  Volume - volume overload, related to low BP's at OP HD the last 1-2 wks.  Anemia esrd - Hb 12, no esa needs MBD ckd - Ca in range, add on phos/ alb. Cont binders and IV vdra PAF - on eliquis, amio H/o CVA - w/ hemiparesis residual COPD      Vinson Moselle  MD [facility] 06/20/2022, 9:44 AM  Recent Labs  Lab 06/14/22 1230 06/19/22 2242 06/20/22 0200  HGB 16.0 12.9*  --   CALCIUM 8.9  --  8.7*  CREATININE 8.66*  --  9.92*  K 4.3  --  5.0   Inpatient medications:  Chlorhexidine Gluconate Cloth  6 each Topical Daily   [START ON 06/21/2022] doxercalciferol  4 mcg Intravenous Q T,Th,Sat-1800   [START ON 06/21/2022] midodrine  10 mg Oral 2 times per day on Tue Thu Sat   mometasone-formoterol  2 puff Inhalation BID   sodium chloride flush  3 mL Intravenous Q12H   sucroferric oxyhydroxide  1,000 mg Oral BID PC     prismasol BGK 4/2.5      prismasol BGK 4/2.5     sodium chloride     amiodarone 30 mg/hr (06/20/22 0800)   lidocaine 1 mg/min (06/20/22 0800)   prismasol BGK 4/2.5     sodium chloride, acetaminophen, albuterol, heparin, ondansetron (ZOFRAN) IV, mouth rinse, sodium chloride flush

## 2022-06-20 NOTE — Progress Notes (Signed)
eLink Physician-Brief Progress Note Patient Name: Nathaniel White DOB: 1960/12/15 MRN: [id number]   Date of Service  06/20/2022  HPI/Events of Note  Patient admitted to the cardiology service with atrial fibrillation with RVR and hypotension, he has a history of ESRD - dialysis dependent and chronic hypotension for which he is taking Midodrine.  eICU Interventions  New Patient Evaluation.        [name] Radiah Lubinski 06/20/2022, 2:21 AM

## 2022-06-20 NOTE — Progress Notes (Signed)
Rounding Note    Patient Name: Nathaniel White Date of Encounter: 06/20/2022  [facility] Cardiologist: Verne Carrow, MD   Subjective   Feels SOB. Denies any palpitations or chest pain.   Inpatient Medications    Scheduled Meds:  Chlorhexidine Gluconate Cloth  6 each Topical Daily   [START ON 06/21/2022] midodrine  10 mg Oral 2 times per day on Tue Thu Sat   mometasone-formoterol  2 puff Inhalation BID   sodium chloride flush  3 mL Intravenous Q12H   sucroferric oxyhydroxide  1,000 mg Oral BID PC   Continuous Infusions:  sodium chloride     amiodarone 30 mg/hr (06/20/22 0800)   lidocaine 1 mg/min (06/20/22 0800)   PRN Meds: sodium chloride, acetaminophen, albuterol, ondansetron (ZOFRAN) IV, mouth rinse, sodium chloride flush   Vital Signs    Vitals:   06/20/22 0800 06/20/22 0815 06/20/22 0829 06/20/22 0830  BP: 108/74 108/71  109/74  Pulse: 62 63  63  Resp: 14 17  19   Temp: 97.9 F (36.6 C)     TempSrc: Oral     SpO2: 95% 95% 94% 93%  Weight:      Height:        Intake/Output Summary (Last 24 hours) at 06/20/2022 0846 Last data filed at 06/20/2022 0800 Gross per 24 hour  Intake 387.23 ml  Output --  Net 387.23 ml      06/20/2022    5:00 AM 06/19/2022   10:55 PM 06/19/2022    1:20 PM  Last 3 Weights  Weight (lbs) 173 lb 15.1 oz 169 lb 12.1 oz 169 lb 12.1 oz  Weight (kg) 78.9 kg 77 kg 77 kg      Telemetry    Afib with controlled rate. Wide complex tachycardia 1:44 am - Personally Reviewed  ECG    Afib    GEN: No acute distress.   Neck: + JVD Cardiac: IRRR, gr 3/6 systolic murmur at apex. Vas cath in left subclavian. Respiratory: bilateral rales GI: Soft, nontender, non-distended  MS: No edema; No deformity. Neuro:  Nonfocal  Psych: Normal affect   Labs    High Sensitivity Troponin:   Recent Labs  Lab 06/12/22 1405 06/12/22 1527  TROPONINIHS 89* 92*     Chemistry Recent Labs  Lab 06/14/22 1230 06/20/22 0013  06/20/22 0200  NA 135  --  135  K 4.3  --  5.0  CL 93*  --  92*  CO2 26  --  23  GLUCOSE 73  --  127*  BUN 34*  --  52*  CREATININE 8.66*  --  9.92*  CALCIUM 8.9  --  8.7*  MG  --  9.6* 3.1*  GFRNONAA 7*  --  6*  ANIONGAP 16*  --  20*    Lipids No results for input(s): "CHOL", "TRIG", "HDL", "LABVLDL", "LDLCALC", "CHOLHDL" in the last 168 hours.  Hematology Recent Labs  Lab 06/14/22 1230 06/19/22 2242  WBC 6.3 5.5  RBC 5.45 4.35  HGB 16.0 12.9*  HCT 51.2 42.1  MCV 93.9 96.8  MCH 29.4 29.7  MCHC 31.3 30.6  RDW 16.8* 17.2*  PLT 84* 64*   Thyroid  Recent Labs  Lab 06/19/22 2242  TSH 2.460    BNPNo results for input(s): "BNP", "PROBNP" in the last 168 hours.  DDimer No results for input(s): "DDIMER" in the last 168 hours.   Radiology    DG Chest Portable 1 View  Result Date: 06/19/2022 CLINICAL DATA:  Shortness of breath, chest pain and irregular heart beat. EXAM: PORTABLE CHEST 1 VIEW COMPARISON:  June 12, 2022 FINDINGS: There is stable left-sided venous catheter positioning. Stable moderate to marked severity enlargement of the cardiac silhouette is seen. Stable mild to moderate severity diffusely increased interstitial lung markings are present with stable mild to moderate severity areas of bibasilar atelectasis. There is a small, stable right pleural effusion. No pneumothorax is identified. The visualized skeletal structures are unremarkable. IMPRESSION: 1. Stable cardiomegaly and mild to moderate severity interstitial edema with stable mild to moderate severity bibasilar atelectasis. 2. Small, stable right pleural effusion. Electronically Signed   By: Aram Candela M.D.   On: 06/19/2022 23:16   ECHOCARDIOGRAM LIMITED  Result Date: 06/19/2022    ECHOCARDIOGRAM LIMITED REPORT   Patient Name:   Nathaniel White Date of Exam: 06/19/2022 Medical Rec #:  [id number]       Height:       69.0 in Accession #:    [id number]      Weight:       169.8 lb Date of Birth:  1960-08-26        BSA:          1.927 m Patient Age:    62 years        BP:           138/60 mmHg Patient Gender: M               HR:           95 bpm. Exam Location:  [facility] Procedure: Limited Echo, Limited Color Doppler and Cardiac Doppler Indications:    SOB (shortness of breath) [R06.02]; Pericardial effusion  History:        Patient has prior history of Echocardiogram examinations, most                 recent 09/05/2021. CHF, COPD and Stroke, Arrythmias:Atrial                 Fibrillation; Risk Factors:Diabetes and Hypertension.  Sonographer:    Thurman Coyer RDCS Referring Phys: [id number] Evern Bio WEAVER IMPRESSIONS  1. Left ventricular ejection fraction, by estimation, is 30 to 35%. The left ventricle has moderately decreased function. The left ventricle has no regional wall motion abnormalities. Left ventricular diastolic function could not be evaluated.  2. Right ventricular systolic function is mildly reduced. The right ventricular size is mildly enlarged.  3. Left atrial size was severely dilated.  4. Right atrial size was mildly dilated.  5. The mitral valve is abnormal. Moderate to severe mitral valve regurgitation due to poor leaflet coaptation and a restricted posterior leaflet. By quantitative measures, MR is severe with an EROA of 0.54cm2 and regurgitant volume of 72ml.  6. The aortic valve is normal in structure. Aortic valve regurgitation is not visualized. No aortic stenosis is present.  7. The inferior vena cava is normal in size with greater than 50% respiratory variability, suggesting right atrial pressure of 3 mmHg. FINDINGS  Left Ventricle: Left ventricular ejection fraction, by estimation, is 30 to 35%. The left ventricle has moderately decreased function. The left ventricle has no regional wall motion abnormalities. The left ventricular internal cavity size was normal in size. There is borderline concentric left ventricular hypertrophy. Left ventricular diastolic function could not be evaluated.  Left ventricular diastolic function could not be evaluated due to atrial fibrillation. Right Ventricle: The right ventricular size is mildly enlarged. No increase in right ventricular wall  thickness. Right ventricular systolic function is mildly reduced. Left Atrium: Left atrial size was severely dilated. Right Atrium: Right atrial size was mildly dilated. Pericardium: There is no evidence of pericardial effusion. Mitral Valve: The mitral valve is abnormal. Moderate to severe mitral valve regurgitation. No evidence of mitral valve stenosis. Tricuspid Valve: The tricuspid valve is normal in structure. Tricuspid valve regurgitation is trivial. No evidence of tricuspid stenosis. Aortic Valve: The aortic valve is normal in structure. Aortic valve regurgitation is not visualized. No aortic stenosis is present. Pulmonic Valve: The pulmonic valve was normal in structure. Pulmonic valve regurgitation is not visualized. No evidence of pulmonic stenosis. Aorta: The aortic root is normal in size and structure. Venous: The inferior vena cava is normal in size with greater than 50% respiratory variability, suggesting right atrial pressure of 3 mmHg. IAS/Shunts: No atrial level shunt detected by color flow Doppler. LEFT VENTRICLE PLAX 2D LVIDd:         5.80 cm LVIDs:         4.40 cm LV PW:         1.10 cm LV IVS:        1.00 cm LVOT diam:     2.00 cm LVOT Area:     3.14 cm  LEFT ATRIUM            Index LA diam:      5.00 cm  2.59 cm/m LA Vol (A4C): 112.0 ml 58.12 ml/m   AORTA Ao Root diam: 3.50 cm Ao Asc diam:  3.50 cm MR Peak grad:    79.6 mmHg    TRICUSPID VALVE MR Mean grad:    51.0 mmHg    TR Peak grad:   56.0 mmHg MR Vmax:         446.00 cm/s  TR Vmax:        374.00 cm/s MR Vmean:        334.0 cm/s MR PISA:         7.60 cm     SHUNTS MR PISA Eff ROA: 54 mm       Systemic Diam: 2.00 cm MR PISA Radius:  1.10 cm Aditya Sabharwal Electronically signed by Dorthula Nettles Signature Date/Time: 06/19/2022/5:32:22 PM    Final      Cardiac Studies   Echo 06/19/22: IMPRESSIONS     1. Left ventricular ejection fraction, by estimation, is 30 to 35%. The  left ventricle has moderately decreased function. The left ventricle has  no regional wall motion abnormalities. Left ventricular diastolic function  could not be evaluated.   2. Right ventricular systolic function is mildly reduced. The right  ventricular size is mildly enlarged.   3. Left atrial size was severely dilated.   4. Right atrial size was mildly dilated.   5. The mitral valve is abnormal. Moderate to severe mitral valve  regurgitation due to poor leaflet coaptation and a restricted posterior  leaflet. By quantitative measures, MR is severe with an EROA of 0.54cm2  and regurgitant volume of 72ml.   6. The aortic valve is normal in structure. Aortic valve regurgitation is  not visualized. No aortic stenosis is present.   7. The inferior vena cava is normal in size with greater than 50%  respiratory variability, suggesting right atrial pressure of 3 mmHg.    Patient Profile     62 y.o. male  with ESRD on dialysis Tuesday, Thursday, and Saturday, HFrEF with EF of 30 to 35%- nonischemic, mod- severe MR, chronic atrial fibrillation,  and chronic hypertension who is being seen 06/20/2022 for the evaluation of wide-complex tachycardia.   Assessment & Plan    Wide complex tachycardia most c/w VT in setting of nonischemic CM. Electrolytes OK. Patient is volume overloaded. Has persistent AFib with controlled rate. Loading with IV amiodarone. EP team to see. Needs ischemic evaluation. Last heart cath in 2015. Will plan on [facility] tomorrow. Took Eliquis yesterday 11 am. Also needs dialysis as he appears to be volume overloaded. Will need femoral access for cath. ESRD on HD. Recent difficulty removing fluid with hypotension. Nephrology to see today Chronic systolic CHF EF 30-35% by Echo. Has been nonischemic in past. GDMT limited by ESRD Chronic mod to severe MR-  functional Afib persistent/chronic. Eliquis on hold for cath. IV heparin. Rate  controlled     For questions or updates, please contact [facility] Please consult [email or web address] for contact info under        Signed, Grayce Budden [place], MD  06/20/2022, 8:46 AM

## 2022-06-20 NOTE — Progress Notes (Signed)
Initial Nutrition Assessment  DOCUMENTATION CODES:   Not applicable  INTERVENTION:  Liberalize diet to regular, can consider adjusting diet once CRRT discontinued Double protein portions with meals Renal MVI with minerals daily  NUTRITION DIAGNOSIS:   Increased nutrient needs related to acute illness, chronic illness as evidenced by estimated needs.  GOAL:   Patient will meet greater than or equal to 90% of their needs  MONITOR:   PO intake, Supplement acceptance, Diet advancement, Labs, Weight trends, I & O's  REASON FOR ASSESSMENT:   Consult Assessment of nutrition requirement/status  ASSESSMENT:   Pt admitted d/t wide-complex tachycardia. PMH significant for ESRD on HD (TTS), HFrEF (30-35%), paroxysmal afib, and chronic HTN.   Noted plans for [facility] tomorrow.   Pt has been unable to reach full UF during last few HD sessions d/t hypotension. Now on CRRT.   Pt is in great spirits with a good friend at bedside who assisted to provide nutrition related history. Pt has a really good appetite and reports eating similarly on HD vs non-HD days. He usually has 2-3 meals and is frequently snacking. He does not always eat protein with all meals/snacks. He admits to drinking dark colas which he had cut out but had reintroduced to his diet again, as well as cookies and chips. Pt reports being followed by RD at the OP dialysis center and understands what/how he should eat but does not always follow renal diet.   EDW 70.5 kg Last OP HD 6/11, post HD weight 77.2 kg  Medications: hectorol, velphoro IV drips: amiodarone  Labs: BUN 52, Cr 9.92, anion gap 20, Phos 7.9, Mg 3.1, GFR 6, CBG's 126, 51  NUTRITION - FOCUSED PHYSICAL EXAM:  Flowsheet Row Most Recent Value  Orbital Region No depletion  Upper Arm Region Mild depletion  Thoracic and Lumbar Region No depletion  Buccal Region No depletion  Temple Region No depletion  Clavicle Bone Region Mild depletion  Clavicle and Acromion  Bone Region Moderate depletion  Scapular Bone Region Moderate depletion  Dorsal Hand Moderate depletion  Patellar Region No depletion  Anterior Thigh Region No depletion  Posterior Calf Region No depletion  Edema (RD Assessment) Moderate  [BLE]  Hair Reviewed  Eyes Reviewed  Mouth Reviewed  Skin Reviewed  Nails Reviewed       Diet Order:   Diet Order             Diet regular Room service appropriate? Yes; Fluid consistency: Thin; Fluid restriction: 1200 mL Fluid  Diet effective now                   EDUCATION NEEDS:   Education needs have been addressed  Skin:  Skin Assessment: Reviewed RN Assessment  Last BM:  PTA  Height:   Ht Readings from Last 1 Encounters:  06/19/22 5\' 9"  (1.753 m)    Weight:   Wt Readings from Last 1 Encounters:  06/20/22 78.9 kg   BMI:  Body mass index is 25.69 kg/m.  Estimated Nutritional Needs:   Kcal:  2100-2300  Protein:  105-120g  Fluid:  1L + UOP  Drusilla Kanner, RDN, LDN Clinical Nutrition

## 2022-06-20 NOTE — Progress Notes (Signed)
PCCM consulted for CVL placement for vasopressor support.  Patient currently on CRRT via [facility] tolerating UF removal and remains hemodynamically stable off vasopressor support.    Will hold off on CVL placement for now.  If needed in the future, please re-consult PCCM.         Posey Boyer, MSN, NP, AG-ACNP-BC [facility] 06/20/2022, 12:26 PM  See Amion for pager If no response to pager , please call [phone] until 7pm After 7:00 pm call Elink  [phone]

## 2022-06-21 ENCOUNTER — Encounter: Payer: Self-pay | Admitting: Physician Assistant

## 2022-06-21 ENCOUNTER — Inpatient Hospital Stay (HOSPITAL_COMMUNITY)
Admission: EM | Disposition: A | Discharge status: Home / Self Care | Payer: Self-pay | Source: Home / Self Care | Attending: Internal Medicine

## 2022-06-21 DIAGNOSIS — I428 Other cardiomyopathies: Secondary | ICD-10-CM | POA: Diagnosis not present

## 2022-06-21 DIAGNOSIS — R570 Cardiogenic shock: Secondary | ICD-10-CM

## 2022-06-21 DIAGNOSIS — I471 Supraventricular tachycardia, unspecified: Secondary | ICD-10-CM | POA: Diagnosis not present

## 2022-06-21 DIAGNOSIS — I472 Ventricular tachycardia, unspecified: Secondary | ICD-10-CM | POA: Diagnosis not present

## 2022-06-21 DIAGNOSIS — I4811 Longstanding persistent atrial fibrillation: Secondary | ICD-10-CM | POA: Diagnosis not present

## 2022-06-21 DIAGNOSIS — I5043 Acute on chronic combined systolic (congestive) and diastolic (congestive) heart failure: Secondary | ICD-10-CM | POA: Diagnosis not present

## 2022-06-21 DIAGNOSIS — I340 Nonrheumatic mitral (valve) insufficiency: Secondary | ICD-10-CM | POA: Diagnosis not present

## 2022-06-21 DIAGNOSIS — I5084 End stage heart failure: Secondary | ICD-10-CM

## 2022-06-21 HISTORY — PX: RIGHT/LEFT HEART CATH AND CORONARY ANGIOGRAPHY: CATH118266

## 2022-06-21 LAB — RENAL FUNCTION PANEL
Albumin: 4.1 g/dL (ref 3.5–5.0)
Albumin: 3.6 g/dL (ref 3.5–5.0)
Anion gap: 16 — ABNORMAL HIGH (ref 5–15)
Anion gap: 15 (ref 5–15)
BUN: 31 mg/dL — ABNORMAL HIGH (ref 6–20)
BUN: 23 mg/dL — ABNORMAL HIGH (ref 6–20)
CO2: 21 mmol/L — ABNORMAL LOW (ref 22–32)
CO2: 21 mmol/L — ABNORMAL LOW (ref 22–32)
Calcium: 9.5 mg/dL (ref 8.9–10.3)
Calcium: 9.1 mg/dL (ref 8.9–10.3)
Chloride: 97 mmol/L — ABNORMAL LOW (ref 98–111)
Chloride: 99 mmol/L (ref 98–111)
Creatinine, Ser: 4.69 mg/dL — ABNORMAL HIGH (ref 0.61–1.24)
Creatinine, Ser: 4.29 mg/dL — ABNORMAL HIGH (ref 0.61–1.24)
GFR, Estimated: 14 mL/min — ABNORMAL LOW (ref >60)
GFR, Estimated: 15 mL/min — ABNORMAL LOW (ref >60)
Glucose, Bld: 72 mg/dL (ref 70–99)
Glucose, Bld: 67 mg/dL — ABNORMAL LOW (ref 70–99)
Phosphorus: 3.2 mg/dL (ref 2.5–4.6)
Phosphorus: 3.7 mg/dL (ref 2.5–4.6)
Potassium: 5.1 mmol/L (ref 3.5–5.1)
Potassium: 5.8 mmol/L — ABNORMAL HIGH (ref 3.5–5.1)
Sodium: 134 mmol/L — ABNORMAL LOW (ref 135–145)
Sodium: 135 mmol/L (ref 135–145)

## 2022-06-21 LAB — CBC
HCT: 50.3 % (ref 39.0–52.0)
Hemoglobin: 15.3 g/dL (ref 13.0–17.0)
MCH: 29 pg (ref 26.0–34.0)
MCHC: 30.4 g/dL (ref 30.0–36.0)
MCV: 95.4 fL (ref 80.0–100.0)
Platelets: 60 10*3/uL — ABNORMAL LOW (ref 150–400)
RBC: 5.27 MIL/uL (ref 4.22–5.81)
RDW: 16.2 % — ABNORMAL HIGH (ref 11.5–15.5)
WBC: 5.1 10*3/uL (ref 4.0–10.5)
nRBC: 0 % (ref 0.0–0.2)

## 2022-06-21 LAB — POCT I-STAT 7, (LYTES, BLD GAS, ICA,H+H)
Acid-base deficit: 4 mmol/L — ABNORMAL HIGH (ref 0.0–2.0)
Bicarbonate: 20 mmol/L (ref 20.0–28.0)
Calcium, Ion: 1.07 mmol/L — ABNORMAL LOW (ref 1.15–1.40)
HCT: 46 % (ref 39.0–52.0)
Hemoglobin: 15.6 g/dL (ref 13.0–17.0)
O2 Saturation: 95 %
Potassium: 5.2 mmol/L — ABNORMAL HIGH (ref 3.5–5.1)
Sodium: 128 mmol/L — ABNORMAL LOW (ref 135–145)
TCO2: 21 mmol/L — ABNORMAL LOW (ref 22–32)
pCO2 arterial: 34.6 mmHg (ref 32–48)
pH, Arterial: 7.37 (ref 7.35–7.45)
pO2, Arterial: 77 mmHg — ABNORMAL LOW (ref 83–108)

## 2022-06-21 LAB — POCT I-STAT EG7
Acid-Base Excess: 0 mmol/L (ref 0.0–2.0)
Acid-Base Excess: 1 mmol/L (ref 0.0–2.0)
Bicarbonate: 25.7 mmol/L (ref 20.0–28.0)
Bicarbonate: 26.1 mmol/L (ref 20.0–28.0)
Calcium, Ion: 1.1 mmol/L — ABNORMAL LOW (ref 1.15–1.40)
Calcium, Ion: 1.11 mmol/L — ABNORMAL LOW (ref 1.15–1.40)
HCT: 48 % (ref 39.0–52.0)
HCT: 49 % (ref 39.0–52.0)
Hemoglobin: 16.3 g/dL (ref 13.0–17.0)
Hemoglobin: 16.7 g/dL (ref 13.0–17.0)
O2 Saturation: 48 %
O2 Saturation: 49 %
Potassium: 5.4 mmol/L — ABNORMAL HIGH (ref 3.5–5.1)
Potassium: 5.4 mmol/L — ABNORMAL HIGH (ref 3.5–5.1)
Sodium: 134 mmol/L — ABNORMAL LOW (ref 135–145)
Sodium: 134 mmol/L — ABNORMAL LOW (ref 135–145)
TCO2: 27 mmol/L (ref 22–32)
TCO2: 27 mmol/L (ref 22–32)
pCO2, Ven: 42.3 mmHg — ABNORMAL LOW (ref 44–60)
pCO2, Ven: 42.3 mmHg — ABNORMAL LOW (ref 44–60)
pH, Ven: 7.392 (ref 7.25–7.43)
pH, Ven: 7.399 (ref 7.25–7.43)
pO2, Ven: 26 mmHg — CL (ref 32–45)
pO2, Ven: 27 mmHg — CL (ref 32–45)

## 2022-06-21 LAB — ECHOCARDIOGRAM LIMITED
MV M vel: 4.46 m/s
Radius: 1.1 cm
S' Lateral: 4.4 cm
Weight: 2716.07 oz

## 2022-06-21 LAB — MAGNESIUM: Magnesium: 2.8 mg/dL — ABNORMAL HIGH (ref 1.7–2.4)

## 2022-06-21 SURGERY — RIGHT/LEFT HEART CATH AND CORONARY ANGIOGRAPHY
Anesthesia: LOCAL

## 2022-06-21 MED ORDER — HEPARIN (PORCINE) IN NACL 1000-0.9 UT/500ML-% IV SOLN
INTRAVENOUS | Status: DC | PRN
  Administered 2022-06-21 (×2): 500 mL

## 2022-06-21 MED ORDER — MIDODRINE HCL 5 MG PO TABS
10.0000 mg | ORAL_TABLET | Freq: Two times a day (BID) | ORAL | Status: DC
  Administered 2022-06-21: 10 mg via ORAL
  Filled 2022-06-21: qty 2

## 2022-06-21 MED ORDER — ENOXAPARIN SODIUM 30 MG/0.3ML IJ SOSY: 30.0000 mg | PREFILLED_SYRINGE | INTRAMUSCULAR | Status: DC

## 2022-06-21 MED ORDER — ACETAMINOPHEN 325 MG PO TABS: 650.0000 mg | ORAL_TABLET | ORAL | Status: DC | PRN

## 2022-06-21 MED ORDER — IOHEXOL 350 MG/ML SOLN
INTRAVENOUS | Status: DC | PRN
  Administered 2022-06-21: 35 mL

## 2022-06-21 MED ORDER — SODIUM CHLORIDE 0.9% FLUSH
3.0000 mL | Freq: Two times a day (BID) | INTRAVENOUS | Status: DC
  Administered 2022-06-22 – 2022-06-27 (×10): 3 mL via INTRAVENOUS

## 2022-06-21 MED ORDER — SODIUM CHLORIDE 0.9 % IV SOLN: 250.0000 mL | INTRAVENOUS | Status: DC | PRN

## 2022-06-21 MED ORDER — ONDANSETRON HCL 4 MG/2ML IJ SOLN: 4.0000 mg | Freq: Four times a day (QID) | INTRAMUSCULAR | Status: DC | PRN

## 2022-06-21 MED ORDER — LABETALOL HCL 5 MG/ML IV SOLN: 10.0000 mg | INTRAVENOUS | Status: AC | PRN

## 2022-06-21 MED ORDER — LIDOCAINE HCL (PF) 1 % IJ SOLN
INTRAMUSCULAR | Status: DC | PRN
  Administered 2022-06-21: 10 mL

## 2022-06-21 MED ORDER — MIDAZOLAM HCL 2 MG/2ML IJ SOLN
INTRAMUSCULAR | Status: DC | PRN
  Administered 2022-06-21: 1 mg via INTRAVENOUS

## 2022-06-21 MED ORDER — SODIUM CHLORIDE 0.9 % IV SOLN
INTRAVENOUS | Status: AC | PRN
  Administered 2022-06-21: 250 mL via INTRAVENOUS

## 2022-06-21 MED ORDER — FENTANYL CITRATE (PF) 100 MCG/2ML IJ SOLN
INTRAMUSCULAR | Status: AC
  Filled 2022-06-21: qty 2

## 2022-06-21 MED ORDER — SODIUM CHLORIDE 0.9% FLUSH: 3.0000 mL | INTRAVENOUS | Status: DC | PRN

## 2022-06-21 MED ORDER — HYDRALAZINE HCL 20 MG/ML IJ SOLN: 10.0000 mg | INTRAMUSCULAR | Status: AC | PRN

## 2022-06-21 MED ORDER — MIDAZOLAM HCL 2 MG/2ML IJ SOLN
INTRAMUSCULAR | Status: AC
  Filled 2022-06-21: qty 2

## 2022-06-21 MED ORDER — LIDOCAINE HCL (PF) 1 % IJ SOLN
INTRAMUSCULAR | Status: AC
  Filled 2022-06-21: qty 30

## 2022-06-21 SURGICAL SUPPLY — 14 items
CATH INFINITI 5FR MULTPACK ANG (CATHETERS) IMPLANT
CATH SWAN GANZ 7F STRAIGHT (CATHETERS) IMPLANT
COVER DOME SNAP 22 D (MISCELLANEOUS) IMPLANT
ELECT DEFIB PAD ADLT CADENCE (PAD) IMPLANT
GUIDEWIRE .025 260CM (WIRE) IMPLANT
KIT HEART LEFT (KITS) ×1 IMPLANT
KIT MICROPUNCTURE NIT STIFF (SHEATH) IMPLANT
PACK CARDIAC CATHETERIZATION (CUSTOM PROCEDURE TRAY) ×1 IMPLANT
SHEATH PINNACLE 5F 10CM (SHEATH) IMPLANT
SHEATH PINNACLE 7F 10CM (SHEATH) IMPLANT
TRANSDUCER W/STOPCOCK (MISCELLANEOUS) ×1 IMPLANT
TUBING CIL FLEX 10 FLL-RA (TUBING) ×1 IMPLANT
WIRE EMERALD 3MM-J .025X260CM (WIRE) IMPLANT
WIRE EMERALD 3MM-J .035X150CM (WIRE) IMPLANT

## 2022-06-21 NOTE — Progress Notes (Addendum)
Advanced Heart Failure Team Consult Note   Primary Physician: Claiborne Rigg, NP PCP-Cardiologist:  Verne Carrow, MD  Reason for Consultation: Acute on chronic biventricular heart failure with cardiogenic shock  HPI:    Nathaniel White is seen today for evaluation of acute on chronic biventricular heart failrue with cardiogenic shock at the request of Dr. Tresa Endo with [facility] cardiology. 62 y.o. male with history of ESRD d/t HTN s/p failed renal transplant in 2010 d/t noncompliance, chronic HFrEF/NICM, moderate to severe MR, persistent AF, pulmonary hypertension, hx CVA, hx GI bleed.  Echo 08/23: EF 30-35%, RV moderately reduced, moderate to severe MR, moderate to severe TR  His volume has been managed by HD. His dialysis sessions have been cut short d/t hypotension. Has remained volume overloaded.  Saw his Cardiologist 06/11. Noted episodes of palpitations. His baseline rhythm was afib with controlled rate. Had echo later in the afternoon and had episode of wide complex tachycardia concerning for VT. He was started on amiodarone and Zio placed. He was referred to EP and Advanced Heart Failure clinic.  Presented to the ED later in the evening with palpitations. He was found to be in ventricular tachycardia. He was given amiodarone bolus and started on amiodarone gtt. Patient was admitted under cardiology service for further management.  Echo this admit EF 25-30%, RV severely reduced, severe MR  R/LHC today: No signifcant CAD, RA mean, 13, PA mean 46, PCWP mean 31 with v wave to 37, LVEDP 16, Fick CO/CI 2.6/1.3, Thermo CO/CI 2.2/1.1, PVR 5.8 WU.  Advanced Heart Failure asked to see to assist with management of severe biventricular heart failure  Home Medications Prior to Admission medications   Medication Sig Start Date End Date Taking? Authorizing Provider  albuterol (PROAIR HFA) 108 (90 Base) MCG/ACT inhaler Inhale 2 puffs into the lungs every 6 (six) hours as needed for  wheezing or shortness of breath. 08/25/21  Yes Hoy Register, MD  albuterol (PROVENTIL) (2.5 MG/3ML) 0.083% nebulizer solution Take 3 mLs (2.5 mg total) by nebulization every 6 (six) hours as needed for wheezing or shortness of breath. 08/25/21  Yes Hoy Register, MD  amiodarone (PACERONE) 200 MG tablet Take 1 tablet (200 mg total) by mouth 2 (two) times daily. For 2 weeks, then take one tablet by mouth daily. 06/19/22  Yes Weaver, Scott T, PA-C  ELIQUIS 5 MG TABS tablet Take 5 mg by mouth 2 (two) times daily. 12/09/21  Yes [provider]  Ferrous Sulfate (IRON PO) Take 1 tablet by mouth in the morning, at noon, and at bedtime.   Yes [provider]  fluticasone-salmeterol (ADVAIR DISKUS) 250-50 MCG/ACT AEPB Inhale 1 puff into the lungs in the morning and at bedtime. NEEDS PASS 08/09/21  Yes Claiborne Rigg, NP  midodrine (PROAMATINE) 10 MG tablet Take 10 mg by mouth See admin instructions. Take 1 tablet by mouth twice a day every Tuesday, Thursday and Saturday 06/18/22  Yes [provider]  midodrine (PROAMATINE) 5 MG tablet Take one tablet by mouth 3 times daily on non-dialysis days, Mon, Wed, Fri and Sun. Patient taking differently: Take one tablet by mouth 3 times daily on non-dialysis days, Mon, Wed, Fri and Sun. 02/19/22  Yes Conte, Tessa N, PA-C  sildenafil (VIAGRA) 100 MG tablet Take 0.5-1 tablets (50-100 mg total) by mouth daily as needed for erectile dysfunction. 05/11/22  Yes Claiborne Rigg, NP  VELPHORO 500 MG chewable tablet Chew 1,000 mg by mouth 2 (two) times daily after  a meal. 02/06/21  Yes [provider]  VISINE 0.05 % ophthalmic solution Place 2-3 drops into both eyes 2 (two) times daily as needed (dry eye). 07/04/21  Yes [provider]  acetaminophen (TYLENOL) 500 MG tablet Take 1 tablet (500 mg total) by mouth every 6 (six) hours as needed. Patient not taking: Reported on 06/19/2022 04/11/18   Linus Mako B, NP  hydrOXYzine (ATARAX) 10 MG  tablet Take 1 tablet (10 mg total) by mouth 3 (three) times daily as needed. For anxiety Patient not taking: Reported on 06/19/2022 06/07/21   Claiborne Rigg, NP  montelukast (SINGULAIR) 10 MG tablet Take 1 tablet (10 mg total) by mouth at bedtime. Patient not taking: Reported on 06/19/2022 06/07/21   Claiborne Rigg, NP    Past Medical History: Past Medical History:  Diagnosis Date   Anemia    Anxiety    Asthma    CHF (congestive heart failure) (HCC)    Complication from renal dialysis device 11/02/2013   Complication of anesthesia 2017   according to pt and spouse pt was moving around and cough while under and pt had difficulty waking up so the anesthesia had to be reversed. and pt admitted to ICU.    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/2008   Qualifier: Diagnosis of  By: Russella Dar MD Bronson Curb T    ESRD    on HD, T-TH-Sat - [place]   Hemiparesis due to old stroke [facility])    left   Hypertension    Hx, not current problems, no meds   LV dysfunction    EF 25-30% by echo 07/2011   Memory loss due to medical condition    due to stroke   MR (mitral regurgitation)    moderate to severe, echo 07/2011   Nausea with vomiting, unspecified 06/15/2021   Paroxysmal atrial fibrillation (HCC)    Peripheral vascular disease (HCC)    Shortness of breath    with exertion   Stroke (HCC)    TIA's-left sided weakness   Tobacco abuse     Past Surgical History: Past Surgical History:  Procedure Laterality Date   ABDOMINAL AORTOGRAM W/LOWER EXTREMITY Bilateral 07/21/2018   Procedure: ABDOMINAL AORTOGRAM W/LOWER EXTREMITY;  Surgeon: Maeola Harman, MD;  Location: [facility] INVASIVE CV LAB;  Service: Cardiovascular;  Laterality: Bilateral;   AMPUTATION Left 11/05/2018    Procedure: AMPUTATION SECOND TOE LEFT FOOT;  Surgeon: Maeola Harman, MD;  Location: [facility] OR;  Service: Vascular;  Laterality: Left;   AMPUTATION Left 02/13/2021   Procedure: Left small finger AMPUTATION DIGIT;  Surgeon: Marlyne Beards, MD;  Location: [facility] OR;  Service: Orthopedics;  Laterality: Left;   AV FISTULA PLACEMENT     CARDIAC CATHETERIZATION     [place] [facility]   COLONOSCOPY W/ BIOPSIES AND POLYPECTOMY     FISTULA SUPERFICIALIZATION Left 11/10/2013   Procedure: FISTULA PLICATION;  Surgeon: Nada Libman, MD;  Location: [facility] OR;  Service: Vascular;  Laterality: Left;   INSERTION OF DIALYSIS CATHETER N/A 06/20/2021   Procedure: INSERTION OF DIALYSIS CATHETER;  Surgeon: Victorino Sparrow, MD;  Location: [facility] OR;  Service: Vascular;  Laterality: N/A;   KIDNEY TRANSPLANT     2011 rejected kidney 2012 back on dialysis   LEFT  AND RIGHT HEART CATHETERIZATION WITH CORONARY ANGIOGRAM N/A 10/09/2013   Procedure: LEFT AND RIGHT HEART CATHETERIZATION WITH CORONARY ANGIOGRAM;  Surgeon: Kathleene Hazel, MD;  Location: [facility] CATH LAB;  Service: Cardiovascular;  Laterality: N/A;   LIGATION OF ARTERIOVENOUS  FISTULA Left 06/20/2021   Procedure: LIGATION OF ARTERIOVENOUS  FISTULA;  Surgeon: Victorino Sparrow, MD;  Location: [facility] OR;  Service: Vascular;  Laterality: Left;   PERIPHERAL VASCULAR INTERVENTION Left 07/21/2018   Procedure: PERIPHERAL VASCULAR INTERVENTION;  Surgeon: Maeola Harman, MD;  Location: [facility] INVASIVE CV LAB;  Service: Cardiovascular;  Laterality: Left;  Popliteal   REVISON OF ARTERIOVENOUS FISTULA Left 08/26/2013   Procedure: EXCISION OF ERODED SKIN AND EXPLORATION OF MAIN LEFT UPPER ARM AV FISTULA;  Surgeon: Larina Earthly, MD;  Location: [facility] OR;  Service: Vascular;  Laterality: Left;   REVISON OF ARTERIOVENOUS FISTULA Left 02/05/2014   Procedure: REPAIR OF ARTERIOVENOUS FISTULA ANEURYSM;  Surgeon: Nada Libman, MD;  Location: [facility] OR;  Service: Vascular;  Laterality:  Left;   REVISON OF ARTERIOVENOUS FISTULA Left 03/10/2021   Procedure: LEFT ARM REVISION OF ARTERIOVENOUS FISTULA WITH PLICATION;  Surgeon: Maeola Harman, MD;  Location: [facility] OR;  Service: Vascular;  Laterality: Left;   SHUNTOGRAM N/A 11/05/2012   Procedure: Fistulogram;  Surgeon: Nada Libman, MD;  Location: [facility] CATH LAB;  Service: Cardiovascular;  Laterality: N/A;   TEE WITHOUT CARDIOVERSION  08/17/2011   Procedure: TRANSESOPHAGEAL ECHOCARDIOGRAM (TEE);  Surgeon: Laurey Morale, MD;  Location: [facility] ENDOSCOPY;  Service: Cardiovascular;  Laterality: N/A;   ULTRASOUND GUIDANCE FOR VASCULAR ACCESS  06/20/2021   Procedure: ULTRASOUND GUIDANCE FOR VASCULAR ACCESS;  Surgeon: Victorino Sparrow, MD;  Location: [facility] OR;  Service: Vascular;;   VIDEO ASSISTED THORACOSCOPY (VATS)/DECORTICATION  08/10/11    Family History: Family History  Problem Relation Age of Onset   Hypertension Mother    Varicose Veins Mother    Diabetes Paternal Grandmother    Cancer Paternal Grandfather    CAD Paternal Uncle     Social History: Social History   Socioeconomic History   Marital status: Single    Spouse name: Not on file   Number of children: 1   Years of education: Not on file   Highest education level: Not on file  Occupational History   Occupation: Nurse, adult and Public librarian: DISABLED  Tobacco Use   Smoking status: Former    Packs/day: 0.50    Years: 15.00    Additional pack years: 0.00    Total pack years: 7.50    Types: Cigarettes    Quit date: 2014    Years since quitting: 10.4   Smokeless tobacco: Never  Vaping Use   Vaping Use: Never used  Substance and Sexual Activity   Alcohol use: No    Alcohol/week: 0.0 standard drinks of alcohol   Drug use: No   Sexual activity: Not Currently  Other Topics Concern   Not on file  Social History Narrative   Not on file   Social Determinants of Health   Financial Resource Strain: Not on file  Food Insecurity: No Food Insecurity  (06/20/2022)   Hunger Vital Sign    Worried About Running Out of Food in the Last Year: Never true    Ran Out of Food in the Last Year: Never true  Transportation Needs: No Transportation Needs (06/20/2022)   PRAPARE - Administrator, Civil Service (Medical): No    Lack of Transportation (Non-Medical):  No  Physical Activity: Not on file  Stress: Not on file  Social Connections: Not on file    Allergies:  Allergies  Allergen Reactions   Codeine Shortness Of Breath   Dextromethorphan-Guaifenesin Shortness Of Breath and Other (See Comments)   Iron Dextran Shortness Of Breath and Other (See Comments)   Morphine And Codeine Shortness Of Breath    Other Reaction(s): Other (See Comments)    Objective:    Vital Signs:   Temp:  [98.2 F (36.8 C)-98.4 F (36.9 C)] 98.2 F (36.8 C) (06/13 1128) Pulse Rate:  [61-123] 66 (06/13 1300) Resp:  [16-31] 21 (06/13 1300) BP: (90-134)/(57-86) 120/71 (06/13 1300) SpO2:  [58 %-100 %] 95 % (06/13 1547) FiO2 (%):  [28 %] 28 % (06/13 0903) Weight:  [80.1 kg] 80.1 kg (06/13 0530) Last BM Date :  (PTA)  Weight change: Filed Weights   06/19/22 2255 06/20/22 0500 06/21/22 0530  Weight: 77 kg 78.9 kg 80.1 kg    Intake/Output:   Intake/Output Summary (Last 24 hours) at 06/21/2022 1559 Last data filed at 06/21/2022 1400 Gross per 24 hour  Intake 779.93 ml  Output 4264.5 ml  Net -3484.57 ml      Physical Exam    General:  Chronically ill appearing. HEENT: normal Neck: supple. JVP to jaw. Carotids 2+ bilat; no bruits.  Cor: PMI nondisplaced. Irregular rhythm. No rubs, gallops, 2/6 MR murmur. Lungs: clear Abdomen: soft, nontender, nondistended.  Extremities: no cyanosis, clubbing, rash, edema Neuro: alert & orientedx3. Affect pleasant   Telemetry   Afib 60s-70s   Labs   Basic Metabolic Panel: Recent Labs  Lab 06/20/22 0013 06/20/22 0200 06/21/22 0651  NA  --  135 134*  K  --  5.0 5.1  CL  --  92* 97*  CO2  --  23  21*  GLUCOSE  --  127* 72  BUN  --  52* 31*  CREATININE  --  9.92* 4.69*  CALCIUM  --  8.7* 9.5  MG 9.6* 3.1* 2.8*  PHOS  --  7.9* 3.2    Liver Function Tests: Recent Labs  Lab 06/20/22 0200 06/21/22 0651  ALBUMIN 3.4* 4.1   No results for input(s): "LIPASE", "AMYLASE" in the last 168 hours. No results for input(s): "AMMONIA" in the last 168 hours.  CBC: Recent Labs  Lab 06/19/22 2242 06/21/22 0949  WBC 5.5 5.1  NEUTROABS 3.9  --   HGB 12.9* 15.3  HCT 42.1 50.3  MCV 96.8 95.4  PLT 64* 60*    Cardiac Enzymes: No results for input(s): "CKTOTAL", "CKMB", "CKMBINDEX", "TROPONINI" in the last 168 hours.  BNP: BNP (last 3 results) Recent Labs    09/04/21 0100 09/13/21 1710 06/12/22 1527  BNP 2,911.4* 3,508.3* 1,479.2*    ProBNP (last 3 results) No results for input(s): "PROBNP" in the last 8760 hours.   CBG: Recent Labs  Lab 06/20/22 0140 06/20/22 0143  GLUCAP 51* 126*    Coagulation Studies: No results for input(s): "LABPROT", "INR" in the last 72 hours.   Imaging   No results found.   Medications:     Current Medications:  [MAR Hold] Chlorhexidine Gluconate Cloth  6 each Topical Daily   [MAR Hold] doxercalciferol  4 mcg Intravenous Q T,Th,Sat-1800   [MAR Hold] midodrine  10 mg Oral BID   [MAR Hold] mometasone-formoterol  2 puff Inhalation BID   [MAR Hold] multivitamin  1 tablet Oral QHS   [MAR Hold] sodium chloride flush  3 mL Intravenous  Q12H   [MAR Hold] sodium chloride flush  3 mL Intravenous Q12H   [MAR Hold] sucroferric oxyhydroxide  1,000 mg Oral BID PC    Infusions:   prismasol BGK 4/2.5 400 mL/hr at 06/21/22 1212    prismasol BGK 4/2.5 400 mL/hr at 06/21/22 1211   [MAR Hold] sodium chloride     sodium chloride     sodium chloride     sodium chloride     amiodarone 30 mg/hr (06/21/22 1300)   lidocaine 1 mg/min (06/21/22 1353)   prismasol BGK 4/2.5 1,500 mL/hr at 06/21/22 1058      Assessment/Plan    End-stage  biventricular heart failure with cardiogenic shock -Presented with VT -Recently unable to tolerate HD d/t hypotension -Echo with EF 25-30%, RV severely reduced, severe MR -R/LHC today: No signifcant CAD, RA mean, 13, PA mean 46, PCWP mean 31 with v wave to 37, LVEDP 16, Fick CO/CI 2.6/1.3, Thermo CO/CI 2.2/1.1, PVR 5.8 WU. -Unfortunately, no further options for management. Not a candidate for MCS or transplant. -Recommend palliative care consult for goals of care discussion.   2. ESRD d/t hypertension: -Failed renal transplant d/t noncompliance -Now unable to tolerate HD  3. VT: -Now on amiodarone gtt and lidocaine -EP consulted  4. Persistnet atrial fibrillation: -Rate controlled  5. Severe MR -Not a candidate for surgery or mTEER  Length of Stay: 1  FINCH, LINDSAY N, PA-C  06/21/2022, 3:59 PM  Advanced Heart Failure Team Pager [phone] (M-F; 7a - 5p)  Please contact [facility] for night-coverage after hours (4p -7a ) and weekends on [email or web address]   Agree with above.   62 y/o male as above with ESRD s/p failed renal transplant (noncompliance), severe systolic HF due to NICM. Admitted with VT and shock. Unable to tolerate HD. Now on CRRT.   Echo today EF 25-30% severe RV dysfunction. Mod-severe MR.   Cath today with profound cardiogenic shock in setting of severe biventricular HF  General:  Weak appearing. No resp difficulty HEENT: normal Neck: supple. JVP to jaw Carotids 2+ bilat; no bruits. No lymphadenopathy or thryomegaly appreciated. Cor: PMI nondisplaced. Regular rate & rhythm. 3/6 MR Lungs: clear Abdomen: soft, nontender, nondistended. No hepatosplenomegaly. No bruits or masses. Good bowel sounds. Extremities: no cyanosis, clubbing, rash, edema Neuro: alert & orientedx3, cranial nerves grossly intact. moves all 4 extremities w/o difficulty. Affect pleasant  Patient now with VT in setting of end-stage HF with severe biventricular HF. Only option for survival  would be heart-kidney transplant but unfortunately he is not candidate due to previous non-compliance. Only option at this point seems to be palliative care - particularly if he is unable to tolerate iHD. D/w Dr. Arlean Hopping.   AHF team will s/o.   CRITICAL CARE Performed by: Arvilla Meres  Total critical care time: 45 minutes  Critical care time was exclusive of separately billable procedures and treating other patients.  Critical care was necessary to treat or prevent imminent or life-threatening deterioration.  Critical care was time spent personally by me (independent of midlevel providers or residents) on the following activities: development of treatment plan with patient and/or surrogate as well as nursing, discussions with consultants, evaluation of patient's response to treatment, examination of patient, obtaining history from patient or surrogate, ordering and performing treatments and interventions, ordering and review of laboratory studies, ordering and review of radiographic studies, pulse oximetry and re-evaluation of patient's condition.  Arvilla Meres, MD  5:04 PM

## 2022-06-21 NOTE — Progress Notes (Signed)
[facility] Progress Note  Subjective: pt seen in ICU, on CRRT overnight w/ net neg IVF's about 4.9L out and 1.3 L in =  net neg 3.5 L.   Vitals:   06/21/22 1230 06/21/22 1245 06/21/22 1300 06/21/22 1408  BP: 106/81 134/83 120/71   Pulse: 65 70 66   Resp: 19 (!) 21 (!) 21   Temp:      TempSrc:      SpO2: 98% 94% 97% 93%  Weight:      Height:        Exam: Gen alert, no distress, much better today  No jvd or bruits Chest mild rales at both bases  RRR no RG Abd soft ntnd no mass or ascites +bs Ext bilat 1-2+ pitting LE edema Neuro is alert, Ox 3 , nf          Home meds include - singulair, albuterol, amiodarone, eliquis, advair diskus, midodrine 10mg  tid on non-hd days, velphoro 1 gm tid ac, prns/ vits/ supps        OP HD: SW TTS 4h  450/ 500   70.5kg  2/2 bath  TDC  Hep none - last OP HD 6/11, post wt 77.2kg   - coming off 3-6 kg over last 2 wks, progressive - hectorol 4 mcg IV tiw - parsabiv 7.5mg  IV tiw    CXR 6/11 - IMPRESSION: Stable cardiomegaly and mild to moderate severity interstitial edema with stable mild to moderate severity bibasilar atelectasis.     Assessment/ Plan: V tach - new onset, assoc w/ low BP's, low EF 30-35% and severe MR. Getting IV amio and lidocaine gtt's in ICU. VT mostly under control. Per primary team.  SOB/ acute hypoxic resp failure / pulm edema - started CRRT on 6/12 yesterday. Getting getting vol down as long as BP's are tolerating. ^UF from 200 --> 250 or 300 cc/ hr net neg if tolerated.  Hypotension - acute on chronic, cont midodrine here --> will ^ to bid every day. Hasn't needed pressors.  ESRD - on HD TTS. CRRT had to be started yesterday due to resp distress and pulm edema. Cont CRRT.  Volume - volume overload, related to low BP's at OP HD the last 1-2 wks.  Anemia esrd - Hb 12, no esa needs MBD ckd - Ca in range, add on phos/ alb. Cont binders and IV vdra PAF - on eliquis, amio H/o CVA - w/ hemiparesis  residual COPD    Vinson Moselle MD [facility] 06/21/2022, 2:19 PM  Recent Labs  Lab 06/19/22 2242 06/20/22 0200 06/21/22 0651 06/21/22 0949  HGB 12.9*  --   --  15.3  ALBUMIN  --  3.4* 4.1  --   CALCIUM  --  8.7* 9.5  --   PHOS  --  7.9* 3.2  --   CREATININE  --  9.92* 4.69*  --   K  --  5.0 5.1  --    No results for input(s): "IRON", "TIBC", "FERRITIN" in the last 168 hours. Inpatient medications:  [MAR Hold] Chlorhexidine Gluconate Cloth  6 each Topical Daily   [MAR Hold] doxercalciferol  4 mcg Intravenous Q T,Th,Sat-1800   [MAR Hold] midodrine  10 mg Oral BID   [MAR Hold] mometasone-formoterol  2 puff Inhalation BID   [MAR Hold] multivitamin  1 tablet Oral QHS   [MAR Hold] sodium chloride flush  3 mL Intravenous Q12H   [MAR Hold] sodium chloride flush  3 mL Intravenous Q12H   [  MAR Hold] sucroferric oxyhydroxide  1,000 mg Oral BID PC     prismasol BGK 4/2.5 400 mL/hr at 06/21/22 1212    prismasol BGK 4/2.5 400 mL/hr at 06/21/22 1211   [MAR Hold] sodium chloride     sodium chloride     sodium chloride     amiodarone 30 mg/hr (06/21/22 1300)   lidocaine 1 mg/min (06/21/22 1353)   prismasol BGK 4/2.5 1,500 mL/hr at 06/21/22 1058   [MAR Hold] sodium chloride, sodium chloride, [MAR Hold] acetaminophen, [MAR Hold] albuterol, [MAR Hold] heparin, [MAR Hold] ondansetron (ZOFRAN) IV, [MAR Hold] mouth rinse, [MAR Hold] sodium chloride flush, sodium chloride flush

## 2022-06-21 NOTE — Progress Notes (Signed)
Rounding Note    Patient Name: Nathaniel White Date of Encounter: 06/21/2022  [facility] Cardiologist: Verne Carrow, MD   Subjective   Patient states he feels much better today. Less SOB. No chest pain. No recurrent VT. Afib rate controlled.   Inpatient Medications    Scheduled Meds:  Chlorhexidine Gluconate Cloth  6 each Topical Daily   doxercalciferol  4 mcg Intravenous Q T,Th,Sat-1800   midodrine  10 mg Oral BID   mometasone-formoterol  2 puff Inhalation BID   multivitamin  1 tablet Oral QHS   sodium chloride flush  3 mL Intravenous Q12H   sodium chloride flush  3 mL Intravenous Q12H   sucroferric oxyhydroxide  1,000 mg Oral BID PC   Continuous Infusions:   prismasol BGK 4/2.5 400 mL/hr at 06/20/22 2301    prismasol BGK 4/2.5 400 mL/hr at 06/20/22 2301   sodium chloride     sodium chloride     sodium chloride     amiodarone 30 mg/hr (06/21/22 0700)   lidocaine 1 mg/min (06/21/22 0700)   prismasol BGK 4/2.5 1,500 mL/hr at 06/21/22 0650   PRN Meds: sodium chloride, sodium chloride, acetaminophen, albuterol, heparin, ondansetron (ZOFRAN) IV, mouth rinse, sodium chloride flush, sodium chloride flush   Vital Signs    Vitals:   06/21/22 0530 06/21/22 0600 06/21/22 0630 06/21/22 0700  BP: 109/71 110/75 120/82 110/70  Pulse: 67 78 75 75  Resp: 20 (!) 25 (!) 21 (!) 22  Temp:      TempSrc:      SpO2: 96% 96% 95% 91%  Weight: 80.1 kg     Height:        Intake/Output Summary (Last 24 hours) at 06/21/2022 0725 Last data filed at 06/21/2022 0700 Gross per 24 hour  Intake 813.34 ml  Output 3912.5 ml  Net -3099.16 ml       06/21/2022    5:30 AM 06/20/2022    5:00 AM 06/19/2022   10:55 PM  Last 3 Weights  Weight (lbs) 176 lb 9.4 oz 173 lb 15.1 oz 169 lb 12.1 oz  Weight (kg) 80.1 kg 78.9 kg 77 kg      Telemetry    Afib with controlled rate. No further VT - Personally Reviewed  ECG    Afib    GEN: No acute distress.  Getting CRRT Neck: +  JVD Cardiac: IRRR, gr 3/6 systolic murmur at apex. Vas cath in left subclavian. Respiratory: bilateral rales GI: Soft, nontender, non-distended  MS: No edema; No deformity. Neuro:  Nonfocal  Psych: Normal affect   Labs    High Sensitivity Troponin:   Recent Labs  Lab 06/12/22 1405 06/12/22 1527  TROPONINIHS 89* 92*      Chemistry Recent Labs  Lab 06/14/22 1230 06/20/22 0013 06/20/22 0200  NA 135  --  135  K 4.3  --  5.0  CL 93*  --  92*  CO2 26  --  23  GLUCOSE 73  --  127*  BUN 34*  --  52*  CREATININE 8.66*  --  9.92*  CALCIUM 8.9  --  8.7*  MG  --  9.6* 3.1*  ALBUMIN  --   --  3.4*  GFRNONAA 7*  --  6*  ANIONGAP 16*  --  20*     Lipids No results for input(s): "CHOL", "TRIG", "HDL", "LABVLDL", "LDLCALC", "CHOLHDL" in the last 168 hours.  Hematology Recent Labs  Lab 06/14/22 1230 06/19/22 2242  WBC 6.3  5.5  RBC 5.45 4.35  HGB 16.0 12.9*  HCT 51.2 42.1  MCV 93.9 96.8  MCH 29.4 29.7  MCHC 31.3 30.6  RDW 16.8* 17.2*  PLT 84* 64*    Thyroid  Recent Labs  Lab 06/19/22 2242  TSH 2.460     BNPNo results for input(s): "BNP", "PROBNP" in the last 168 hours.  DDimer No results for input(s): "DDIMER" in the last 168 hours.   Radiology    DG Chest Portable 1 View  Result Date: 06/19/2022 CLINICAL DATA:  Shortness of breath, chest pain and irregular heart beat. EXAM: PORTABLE CHEST 1 VIEW COMPARISON:  June 12, 2022 FINDINGS: There is stable left-sided venous catheter positioning. Stable moderate to marked severity enlargement of the cardiac silhouette is seen. Stable mild to moderate severity diffusely increased interstitial lung markings are present with stable mild to moderate severity areas of bibasilar atelectasis. There is a small, stable right pleural effusion. No pneumothorax is identified. The visualized skeletal structures are unremarkable. IMPRESSION: 1. Stable cardiomegaly and mild to moderate severity interstitial edema with stable mild to moderate  severity bibasilar atelectasis. 2. Small, stable right pleural effusion. Electronically Signed   By: Aram Candela M.D.   On: 06/19/2022 23:16   ECHOCARDIOGRAM LIMITED  Result Date: 06/19/2022    ECHOCARDIOGRAM LIMITED REPORT   Patient Name:   Nathaniel White Date of Exam: 06/19/2022 Medical Rec #:  [id number]       Height:       69.0 in Accession #:    [id number]      Weight:       169.8 lb Date of Birth:  1960/09/16       BSA:          1.927 m Patient Age:    61 years        BP:           138/60 mmHg Patient Gender: M               HR:           95 bpm. Exam Location:  [facility] Procedure: Limited Echo, Limited Color Doppler and Cardiac Doppler Indications:    SOB (shortness of breath) [R06.02]; Pericardial effusion  History:        Patient has prior history of Echocardiogram examinations, most                 recent 09/05/2021. CHF, COPD and Stroke, Arrythmias:Atrial                 Fibrillation; Risk Factors:Diabetes and Hypertension.  Sonographer:    Thurman Coyer RDCS Referring Phys: [id number] Evern Bio WEAVER IMPRESSIONS  1. Left ventricular ejection fraction, by estimation, is 30 to 35%. The left ventricle has moderately decreased function. The left ventricle has no regional wall motion abnormalities. Left ventricular diastolic function could not be evaluated.  2. Right ventricular systolic function is mildly reduced. The right ventricular size is mildly enlarged.  3. Left atrial size was severely dilated.  4. Right atrial size was mildly dilated.  5. The mitral valve is abnormal. Moderate to severe mitral valve regurgitation due to poor leaflet coaptation and a restricted posterior leaflet. By quantitative measures, MR is severe with an EROA of 0.54cm2 and regurgitant volume of 72ml.  6. The aortic valve is normal in structure. Aortic valve regurgitation is not visualized. No aortic stenosis is present.  7. The inferior vena cava is normal in size with greater than 50%  respiratory variability,  suggesting right atrial pressure of 3 mmHg. FINDINGS  Left Ventricle: Left ventricular ejection fraction, by estimation, is 30 to 35%. The left ventricle has moderately decreased function. The left ventricle has no regional wall motion abnormalities. The left ventricular internal cavity size was normal in size. There is borderline concentric left ventricular hypertrophy. Left ventricular diastolic function could not be evaluated. Left ventricular diastolic function could not be evaluated due to atrial fibrillation. Right Ventricle: The right ventricular size is mildly enlarged. No increase in right ventricular wall thickness. Right ventricular systolic function is mildly reduced. Left Atrium: Left atrial size was severely dilated. Right Atrium: Right atrial size was mildly dilated. Pericardium: There is no evidence of pericardial effusion. Mitral Valve: The mitral valve is abnormal. Moderate to severe mitral valve regurgitation. No evidence of mitral valve stenosis. Tricuspid Valve: The tricuspid valve is normal in structure. Tricuspid valve regurgitation is trivial. No evidence of tricuspid stenosis. Aortic Valve: The aortic valve is normal in structure. Aortic valve regurgitation is not visualized. No aortic stenosis is present. Pulmonic Valve: The pulmonic valve was normal in structure. Pulmonic valve regurgitation is not visualized. No evidence of pulmonic stenosis. Aorta: The aortic root is normal in size and structure. Venous: The inferior vena cava is normal in size with greater than 50% respiratory variability, suggesting right atrial pressure of 3 mmHg. IAS/Shunts: No atrial level shunt detected by color flow Doppler. LEFT VENTRICLE PLAX 2D LVIDd:         5.80 cm LVIDs:         4.40 cm LV PW:         1.10 cm LV IVS:        1.00 cm LVOT diam:     2.00 cm LVOT Area:     3.14 cm  LEFT ATRIUM            Index LA diam:      5.00 cm  2.59 cm/m LA Vol (A4C): 112.0 ml 58.12 ml/m   AORTA Ao Root diam: 3.50 cm Ao  Asc diam:  3.50 cm MR Peak grad:    79.6 mmHg    TRICUSPID VALVE MR Mean grad:    51.0 mmHg    TR Peak grad:   56.0 mmHg MR Vmax:         446.00 cm/s  TR Vmax:        374.00 cm/s MR Vmean:        334.0 cm/s MR PISA:         7.60 cm     SHUNTS MR PISA Eff ROA: 54 mm       Systemic Diam: 2.00 cm MR PISA Radius:  1.10 cm Aditya Sabharwal Electronically signed by Dorthula Nettles Signature Date/Time: 06/19/2022/5:32:22 PM    Final     Cardiac Studies   Echo 06/19/22: IMPRESSIONS     1. Left ventricular ejection fraction, by estimation, is 30 to 35%. The  left ventricle has moderately decreased function. The left ventricle has  no regional wall motion abnormalities. Left ventricular diastolic function  could not be evaluated.   2. Right ventricular systolic function is mildly reduced. The right  ventricular size is mildly enlarged.   3. Left atrial size was severely dilated.   4. Right atrial size was mildly dilated.   5. The mitral valve is abnormal. Moderate to severe mitral valve  regurgitation due to poor leaflet coaptation and a restricted posterior  leaflet. By quantitative measures, MR is severe with  an EROA of 0.54cm2  and regurgitant volume of 72ml.   6. The aortic valve is normal in structure. Aortic valve regurgitation is  not visualized. No aortic stenosis is present.   7. The inferior vena cava is normal in size with greater than 50%  respiratory variability, suggesting right atrial pressure of 3 mmHg.    Patient Profile     62 y.o. male  with ESRD on dialysis Tuesday, Thursday, and Saturday, HFrEF with EF of 30 to 35%- nonischemic, mod- severe MR, chronic atrial fibrillation, and chronic hypertension who is being seen 06/20/2022 for the evaluation of wide-complex tachycardia.   Assessment & Plan    Wide complex tachycardia most c/w VT in setting of nonischemic CM. Electrolytes OK. Patient's volume status improved with CRRT. Has persistent AFib with controlled rate. On IV  amiodarone for VT EP team to see today. Needs ischemic evaluation. Last heart cath in 2015. Will plan on [facility] today.  Has been off Eliquis for 2 days now. Will need femoral access for cath. The procedure and risks were reviewed including but not limited to death, myocardial infarction, stroke, arrythmias, bleeding, transfusion, emergency surgery, dye allergy, or renal dysfunction. The patient voices understanding and is agreeable to proceed. ESRD on HD. Tolerating CRRT well. Appreciate Nephrology input Chronic systolic CHF EF 30-35% by Echo. Has been nonischemic in past. GDMT limited by ESRD Chronic mod to severe MR- functional Afib persistent/chronic. Eliquis on hold for cath. IV heparin. Rate  controlled     For questions or updates, please contact [facility] Please consult [email or web address] for contact info under        Signed, Treyvone Chelf [place], MD  06/21/2022, 7:25 AM

## 2022-06-22 ENCOUNTER — Encounter (HOSPITAL_COMMUNITY): Payer: Self-pay | Admitting: Cardiovascular Disease

## 2022-06-22 DIAGNOSIS — Z7189 Other specified counseling: Secondary | ICD-10-CM

## 2022-06-22 DIAGNOSIS — Z515 Encounter for palliative care: Secondary | ICD-10-CM

## 2022-06-22 DIAGNOSIS — Z66 Do not resuscitate: Secondary | ICD-10-CM

## 2022-06-22 LAB — MAGNESIUM: Magnesium: 2.7 mg/dL — ABNORMAL HIGH (ref 1.7–2.4)

## 2022-06-22 LAB — BASIC METABOLIC PANEL
Anion gap: 16 — ABNORMAL HIGH (ref 5–15)
BUN: 21 mg/dL — ABNORMAL HIGH (ref 6–20)
CO2: 20 mmol/L — ABNORMAL LOW (ref 22–32)
Calcium: 9.7 mg/dL (ref 8.9–10.3)
Chloride: 98 mmol/L (ref 98–111)
Creatinine, Ser: 3.62 mg/dL — ABNORMAL HIGH (ref 0.61–1.24)
GFR, Estimated: 19 mL/min — ABNORMAL LOW (ref >60)
Glucose, Bld: 99 mg/dL (ref 70–99)
Potassium: 4.9 mmol/L (ref 3.5–5.1)
Sodium: 134 mmol/L — ABNORMAL LOW (ref 135–145)

## 2022-06-22 LAB — RENAL FUNCTION PANEL
Albumin: 3.9 g/dL (ref 3.5–5.0)
Anion gap: 16 — ABNORMAL HIGH (ref 5–15)
BUN: 21 mg/dL — ABNORMAL HIGH (ref 6–20)
CO2: 21 mmol/L — ABNORMAL LOW (ref 22–32)
Calcium: 9.7 mg/dL (ref 8.9–10.3)
Chloride: 97 mmol/L — ABNORMAL LOW (ref 98–111)
Creatinine, Ser: 3.71 mg/dL — ABNORMAL HIGH (ref 0.61–1.24)
GFR, Estimated: 18 mL/min — ABNORMAL LOW (ref >60)
Glucose, Bld: 98 mg/dL (ref 70–99)
Phosphorus: 3.2 mg/dL (ref 2.5–4.6)
Potassium: 4.9 mmol/L (ref 3.5–5.1)
Sodium: 134 mmol/L — ABNORMAL LOW (ref 135–145)

## 2022-06-22 LAB — CBC
HCT: 54.7 % — ABNORMAL HIGH (ref 39.0–52.0)
Hemoglobin: 16.8 g/dL (ref 13.0–17.0)
MCH: 29.9 pg (ref 26.0–34.0)
MCHC: 30.7 g/dL (ref 30.0–36.0)
MCV: 97.3 fL (ref 80.0–100.0)
Platelets: 65 10*3/uL — ABNORMAL LOW (ref 150–400)
RBC: 5.62 MIL/uL (ref 4.22–5.81)
RDW: 16.3 % — ABNORMAL HIGH (ref 11.5–15.5)
WBC: 4.6 10*3/uL (ref 4.0–10.5)
nRBC: 0 % (ref 0.0–0.2)

## 2022-06-22 LAB — LIDOCAINE LEVEL: Lidocaine Lvl: 7.5 ug/mL — ABNORMAL HIGH (ref 1.5–5.0)

## 2022-06-22 LAB — HEPATITIS B SURFACE ANTIGEN: Hepatitis B Surface Ag: NONREACTIVE

## 2022-06-22 MED ORDER — CHLORHEXIDINE GLUCONATE CLOTH 2 % EX PADS
6.0000 | MEDICATED_PAD | Freq: Every day | CUTANEOUS | Status: DC
  Administered 2022-06-23 – 2022-06-28 (×5): 6 via TOPICAL

## 2022-06-22 MED ORDER — HYDROXYZINE HCL 10 MG PO TABS
10.0000 mg | ORAL_TABLET | Freq: Three times a day (TID) | ORAL | Status: DC | PRN
  Administered 2022-06-22 – 2022-06-25 (×4): 10 mg via ORAL
  Filled 2022-06-22 (×5): qty 1

## 2022-06-22 MED ORDER — MIDODRINE HCL 5 MG PO TABS
10.0000 mg | ORAL_TABLET | Freq: Three times a day (TID) | ORAL | Status: DC
  Administered 2022-06-22 – 2022-06-28 (×19): 10 mg via ORAL
  Filled 2022-06-22 (×20): qty 2

## 2022-06-22 MED ORDER — APIXABAN 5 MG PO TABS
5.0000 mg | ORAL_TABLET | Freq: Two times a day (BID) | ORAL | Status: DC
  Administered 2022-06-22 – 2022-06-28 (×13): 5 mg via ORAL
  Filled 2022-06-22 (×13): qty 1

## 2022-06-22 MED ORDER — HEPARIN SODIUM (PORCINE) 1000 UNIT/ML DIALYSIS
1000.0000 [IU] | INTRAMUSCULAR | Status: DC | PRN
  Administered 2022-06-22 – 2022-06-23 (×2): 4200 [IU] via INTRAVENOUS_CENTRAL
  Filled 2022-06-22: qty 3
  Filled 2022-06-22: qty 4
  Filled 2022-06-22 (×2): qty 6

## 2022-06-22 MED ORDER — SENNOSIDES-DOCUSATE SODIUM 8.6-50 MG PO TABS
1.0000 | ORAL_TABLET | Freq: Every day | ORAL | Status: DC
  Administered 2022-06-22 – 2022-06-27 (×6): 1 via ORAL
  Filled 2022-06-22 (×7): qty 1

## 2022-06-22 MED ORDER — AMIODARONE HCL 200 MG PO TABS: 200.0000 mg | ORAL_TABLET | Freq: Two times a day (BID) | ORAL | Status: DC

## 2022-06-22 MED ORDER — AMIODARONE HCL 200 MG PO TABS
400.0000 mg | ORAL_TABLET | Freq: Two times a day (BID) | ORAL | Status: DC
  Administered 2022-06-22 – 2022-06-28 (×13): 400 mg via ORAL
  Filled 2022-06-22 (×13): qty 2

## 2022-06-22 NOTE — Consult Note (Signed)
Palliative Medicine Inpatient Consult Note  Consulting Provider: Dolores Patty, MD   Reason for consult:   Palliative Care Consult Services Palliative Medicine Consult  Reason for Consult? GOC   06/22/2022  HPI:  Per intake H&P --> 62 y.o. male  with ESRD on dialysis Tuesday, Thursday, and Saturday, HFrEF with EF of 30 to 35%- nonischemic, mod- severe MR, chronic atrial fibrillation, and chronic hypertension who is being seen 06/20/2022 for the evaluation of wide-complex tachycardia.   Palliative care has been asked to get involved for further goals of care conversations in the setting of end-stage biventricular heart failure with cardiogenic shock.  Clinical Assessment/Goals of Care:  *Please note that this is a verbal dictation therefore any spelling or grammatical errors are due to the "Dragon Medical One" system interpretation.  I have reviewed medical records including EPIC notes, labs and imaging, received report from bedside RN, assessed the patient who is lying in bed eating spaghetti.    I met with Nathaniel White, his sister, mother, father, and two "daughters" who he considers to be his own to further discuss diagnosis prognosis, GOC, EOL wishes, disposition and options.   I introduced Palliative Medicine as specialized medical care for people living with serious illness. It focuses on providing relief from the symptoms and stress of a serious illness. The goal is to improve quality of life for both the patient and the family.  Medical History Review and Understanding:  I discussed with Nathaniel White his past medical history significant for heart failure, end-stage renal disease, atrial fibrillation, hypertension, and mitral valve regurgitation.  Social History:  Nathaniel White shares with me that he lives in [place].  He is not married.  He has 1 biological son, Nathaniel White and 3 other girls who he considers to be his daughter's by proxy.  He formally worked as an  Orthoptist at [employer] though he does share he can do anything he puts his mind to.  He expresses that he gets joy out of his dog a labradoodle mix.  He also vocalizes just enjoyment in every day life Nathaniel White is a man of faith and practices within Christianity.   Functional and Nutritional State:  Preceding hospitalization Nathaniel White was was living in a home with 3 other roommates.  His good friend Nathaniel White helps him with things like basic activities of daily living he was able to shower himself with a shower chair, dress himself, feed himself though would require help with meal preparation.  Preceding admission patient's appetite had begun to dwindle and he had vocalizes having lost a good amount of weight.  Advance Directives:  A detailed discussion was had today regarding advanced directives.  Nathaniel White does not have any advanced directives though he shares he would rely on his son Nathaniel White to make decisions and is on or if he were incapacitated.  Code Status:  Concepts specific to code status, artifical feeding and hydration, continued IV antibiotics and rehospitalization was had.  The difference between a aggressive medical intervention path  and a palliative comfort care path for this patient at this time was had.   Encouraged patient/family to consider DNR/DNI status understanding evidenced based poor outcomes in similar hospitalized patient, as the cause of arrest is likely associated with advanced chronic/terminal illness rather than an easily reversible acute cardio-pulmonary event. I explained that DNR/DNI does not change the medical plan and it only comes into effect after a person has arrested (died).  It is a protective measure to  keep [place] from harming the patient in their last moments of life. Nathaniel White was agreeable to DNR/DNI with understanding that patient would not receive CPR, defibrillation, ACLS medications, or intubation.   Discussion:  An open and honest  conversation was held in the setting of patient's end-stage biventricular heart failure with cardiogenic shock.  We reviewed the relationship between the heart and the kidneys and the concern in regard to Nathaniel White's tolerance of dialysis.  I shared openly and honestly that Nathaniel White has a very poor prognosis and it is worthwhile to consider wishes moving forward most especially if he is unable to tolerate hemodialysis into the future.  We reviewed patient's arrhythmia when he does get dialysis and how this is something which can lead to the end of his life.    I did gently broach the topic of hospice care.I described hospice as a service for patients who have a life expectancy of 6 months or less. The goal of hospice is the preservation of dignity and quality at the end phases of life. Under hospice care, the focus changes from curative to symptom relief.   The knowledge of patient's poor prognosis was difficult for him to hear which he did vocalize as it was difficult for his family to hear.  Patient's parents and sister want to honor what Nathaniel White's wishes are at this time.  We discussed that there are some other family members who would benefit from a meeting and also hearing directly from the nephrology and cardiology teams.  At this time we are planning for meeting on Sunday afternoon at 1 PM to further delineate future goals of care.  Discussed the importance of continued conversation with family and their  medical providers regarding overall plan of care and treatment options, ensuring decisions are within the context of the patients values and GOCs.  Decision Maker: Patient can make decisions for himself  SUMMARY OF RECOMMENDATIONS   DNAR/DNI  Open and honest conversations were held in the setting of patient's advanced heart failure, new onset V. tach, and poor ability to tolerate dialysis treatments  Discussed the importance of additional family being present for goals of care meetings at this  time we plan to meet again at 1PM on Sunday - family would appreciate nephrology and cardiology  presence  The topic of hospice was broached with the patient  Ongoing palliative care support  Code Status/Advance Care Planning: DNAR/DNI  Palliative Prophylaxis:  Aspiration, Bowel Regimen, Delirium Protocol, Frequent Pain Assessment, Oral Care, Palliative Wound Care, and Turn Reposition  Additional Recommendations (Limitations, Scope, Preferences): Continue current care  Psycho-social/Spiritual:  Desire for further Chaplaincy support: Yes patient is [place] Additional Recommendations: Education on advanced heart failure   Prognosis: Exceptionally limited hospice is an appropriate consideration at this time  Discharge Planning: Discharge plan is uncertain  Vitals:   06/22/22 1600 06/22/22 1700  BP: (!) 113/98 111/87  Pulse: 66 73  Resp: 20 (!) 24  Temp:    SpO2: 100% 100%    Intake/Output Summary (Last 24 hours) at 06/22/2022 1738 Last data filed at 06/22/2022 1600 Gross per 24 hour  Intake 633.42 ml  Output 4111 ml  Net -3477.58 ml   Last Weight  Most recent update: 06/22/2022  5:35 AM    Weight  69.3 kg (152 lb 12.5 oz)            Gen: Middle-aged African-American male in no acute distress HEENT: moist mucous membranes CV: Regular rate and irregular rhythm PULM: On 3 L  nasal cannula breathing is even and unlabored ABD: soft/nontender EXT: No edema Neuro: Alert and oriented x3  PPS: 30-40%   This conversation/these recommendations were discussed with patient primary care team  Billing based on MDM: High ______________________________________________________ Nathaniel White Palliative Medicine Team Team Cell Phone: [phone] Please utilize secure chat with additional questions, if there is no response within 30 minutes please call the above phone number  Palliative Medicine Team providers are available by phone from 7am to 7pm daily and  can be reached through the team cell phone.  Should this patient require assistance outside of these hours, please call the patient's attending physician.

## 2022-06-22 NOTE — Progress Notes (Signed)
   06/22/22 1057  Spiritual Encounters  Type of Visit Initial  Care provided to: Pt and family  Conversation partners present during encounter Nurse  Referral source Nurse (RN/NT/LPN)  Reason for visit Urgent spiritual support  OnCall Visit No  Spiritual Framework  Presenting Themes Values and beliefs;Significant life change;Coping tools;Courage hope and growth  Community/Connection Family  Interventions  Spiritual Care Interventions Made Compassionate presence;Reflective listening;Narrative/life review;Explored values/beliefs/practices/strengths;Bereavement/grief support;Prayer   Chaplain was on the floor and nurse asked me to visit PT stating that he had received a grim diagnosis with poor expected outcome and he was struggling to come to terms with it.  Chaplain arrived to find DR speaking with PT explaining the situation with his heart.  DR also noted that palliative care would be having a family meeting with PT later today. Chaplain sat with PT and 2 visitors in the room introduced to me as male friend and Gabriel Rung, a beloved neighbor.  Chaplain spoke with PT about his feeling regarding his diagnosis and he expressed sadness but at the same time mentioned his hope of heaven.  PT asked chaplain to pray and we did that. Chaplain services remain available and this chaplain will monitor PT condition throughout today.

## 2022-06-22 NOTE — Progress Notes (Signed)
Rounding Note    Patient Name: Nathaniel White Date of Encounter: 06/22/2022  [facility] Cardiologist: Verne Carrow, MD   Subjective   Patient denies any chest pain or SOB. Very anxious about his cardiac situation.  Inpatient Medications    Scheduled Meds:  amiodarone  400 mg Oral BID   Followed by   Melene Muller ON 06/29/2022] amiodarone  200 mg Oral BID   Chlorhexidine Gluconate Cloth  6 each Topical Daily   enoxaparin (LOVENOX) injection  30 mg Subcutaneous Q24H   midodrine  10 mg Oral TID   mometasone-formoterol  2 puff Inhalation BID   multivitamin  1 tablet Oral QHS   senna-docusate  1 tablet Oral Daily   sodium chloride flush  3 mL Intravenous Q12H   sodium chloride flush  3 mL Intravenous Q12H   sodium chloride flush  3 mL Intravenous Q12H   sucroferric oxyhydroxide  1,000 mg Oral BID PC   Continuous Infusions:   prismasol BGK 4/2.5 400 mL/hr at 06/22/22 0421    prismasol BGK 4/2.5 400 mL/hr at 06/22/22 0421   sodium chloride     sodium chloride     prismasol BGK 4/2.5 1,500 mL/hr at 06/22/22 0634   PRN Meds: sodium chloride, sodium chloride, acetaminophen, albuterol, heparin, hydrOXYzine, ondansetron (ZOFRAN) IV, mouth rinse, sodium chloride flush, sodium chloride flush   Vital Signs    Vitals:   06/22/22 0730 06/22/22 0737 06/22/22 0800 06/22/22 0829  BP: (!) 84/67  (!) 88/57   Pulse: 87  84   Resp: (!) 23  (!) 25   Temp:    (!) 96.7 F (35.9 C)  TempSrc:    Axillary  SpO2: 100% 97% 97%   Weight:      Height:        Intake/Output Summary (Last 24 hours) at 06/22/2022 0844 Last data filed at 06/22/2022 0800 Gross per 24 hour  Intake 720.73 ml  Output 4830 ml  Net -4109.27 ml       06/22/2022    5:30 AM 06/21/2022    5:30 AM 06/20/2022    5:00 AM  Last 3 Weights  Weight (lbs) 152 lb 12.5 oz 176 lb 9.4 oz 173 lb 15.1 oz  Weight (kg) 69.3 kg 80.1 kg 78.9 kg      Telemetry    Afib with controlled rate. No further sustained VT -  Personally Reviewed  ECG    none  GEN: No acute distress.  Getting CRRT Neck: + JVD to jaw Cardiac: IRRR, gr 3/6 systolic murmur at apex. Vas cath in left subclavian. Respiratory: clear GI: Soft, nontender, non-distended  MS: No edema; No deformity. Neuro:  Nonfocal  Psych: Normal affect   Labs    High Sensitivity Troponin:   Recent Labs  Lab 06/12/22 1405 06/12/22 1527  TROPONINIHS 89* 92*      Chemistry Recent Labs  Lab 06/20/22 0200 06/21/22 0651 06/21/22 1522 06/21/22 1755 06/22/22 0031 06/22/22 0032  NA 135 134*   < > 135 134* 134*  K 5.0 5.1   < > 5.8* 4.9 4.9  CL 92* 97*  --  99 98 97*  CO2 23 21*  --  21* 20* 21*  GLUCOSE 127* 72  --  67* 99 98  BUN 52* 31*  --  23* 21* 21*  CREATININE 9.92* 4.69*  --  4.29* 3.62* 3.71*  CALCIUM 8.7* 9.5  --  9.1 9.7 9.7  MG 3.1* 2.8*  --   --  2.7*  --   ALBUMIN 3.4* 4.1  --  3.6  --  3.9  GFRNONAA 6* 14*  --  15* 19* 18*  ANIONGAP 20* 16*  --  15 16* 16*   < > = values in this interval not displayed.     Lipids No results for input(s): "CHOL", "TRIG", "HDL", "LABVLDL", "LDLCALC", "CHOLHDL" in the last 168 hours.  Hematology Recent Labs  Lab 06/19/22 2242 06/21/22 0949 06/21/22 1522 06/21/22 1536 06/22/22 0031  WBC 5.5 5.1  --   --  4.6  RBC 4.35 5.27  --   --  5.62  HGB 12.9* 15.3 16.7  16.3 15.6 16.8  HCT 42.1 50.3 49.0  48.0 46.0 54.7*  MCV 96.8 95.4  --   --  97.3  MCH 29.7 29.0  --   --  29.9  MCHC 30.6 30.4  --   --  30.7  RDW 17.2* 16.2*  --   --  16.3*  PLT 64* 60*  --   --  65*    Thyroid  Recent Labs  Lab 06/19/22 2242  TSH 2.460     BNPNo results for input(s): "BNP", "PROBNP" in the last 168 hours.  DDimer No results for input(s): "DDIMER" in the last 168 hours.   Radiology    CARDIAC CATHETERIZATION  Result Date: 06/21/2022   LV end diastolic pressure is mildly elevated.   Patient is on Eliquis with A-fib history. Nonischemic dilated cardiomyopathy with essentially normal  coronary arteries. Severely elevated right heart pressures with RV pressure 81/10, and mean pulmonary artery pressure at 46. Elevated SVR and PVR. RECOMMENDATIONS: Patient was admitted with new onset ventricular tachycardia, currently on amiodarone and lidocaine.  He currently is in atrial fibrillation.  He has significant volume overload and CRRT was initiated yesterday.  Plan to resume postprocedure.  Advanced heart failure team has also been notified in light of his significant right heart pressure elevations.  He has been documented to have moderately severe mitral regurgitation on echocardiography with a EF at 30 to 35%.    Cardiac Studies   Echo 06/19/22: IMPRESSIONS     1. Left ventricular ejection fraction, by estimation, is 30 to 35%. The  left ventricle has moderately decreased function. The left ventricle has  no regional wall motion abnormalities. Left ventricular diastolic function  could not be evaluated.   2. Right ventricular systolic function is mildly reduced. The right  ventricular size is mildly enlarged.   3. Left atrial size was severely dilated.   4. Right atrial size was mildly dilated.   5. The mitral valve is abnormal. Moderate to severe mitral valve  regurgitation due to poor leaflet coaptation and a restricted posterior  leaflet. By quantitative measures, MR is severe with an EROA of 0.54cm2  and regurgitant volume of 72ml.   6. The aortic valve is normal in structure. Aortic valve regurgitation is  not visualized. No aortic stenosis is present.   7. The inferior vena cava is normal in size with greater than 50%  respiratory variability, suggesting right atrial pressure of 3 mmHg.    Patient Profile     62 y.o. male  with ESRD on dialysis Tuesday, Thursday, and Saturday, HFrEF with EF of 30 to 35%- nonischemic, mod- severe MR, chronic atrial fibrillation, and chronic hypertension who is being seen 06/20/2022 for the evaluation of wide-complex tachycardia.    Assessment & Plan    Wide complex tachycardia most c/w VT in setting of nonischemic CM.  Electrolytes OK. Patient's volume status improved with CRRT. Has persistent AFib with controlled rate. Has been on lidocaine and amiodarone IV. Will DC lidocaine. Transition amiodarone to po. 400 mg daily for 1 week then 200 mg daily.  Last heart cath in 2015. Can resume Eliquis for Afib ESRD on HD.  Appreciate Nephrology input. Has pulled off good fluid last 3 days but now hypotensive. Will increase midodrine to 10 mg tid. Hypotension is going to be an ongoing issue with his endstage CHF. Acute on Chronic systolic CHF with cardiogenic shock and advance RV/LV failure. Appreciate Advanced heart failure team input. Really noting to offer from a medication or advanced therapies standpoint. No CAD. Plan palliative care consult. Prognosis very poor.  Chronic mod to severe MR- functional Afib persistent/chronic.      For questions or updates, please contact [facility] Please consult [email or web address] for contact info under        Signed, Mckoy Bhakta [place], MD  06/22/2022, 8:44 AM

## 2022-06-22 NOTE — Progress Notes (Signed)
Pt arrived to room 4E 25 via bed from 2H. Received report from Maralyn Sago, Charity fundraiser. See assessment. Will continue to monitor.

## 2022-06-22 NOTE — Progress Notes (Signed)
[facility] Progress Note  Subjective: pt seen in ICU. BP's dropped overnight, in 70s now, pt is not symptomatic. I/O yest was net neg 3.6 L and today net neg 2 L so far. Wts are down to 69.5kg which is just under his dry wt.   Vitals:   06/22/22 0900 06/22/22 0930 06/22/22 1000 06/22/22 1030  BP: (!) 79/64 93/66 (!) 85/63 91/74  Pulse: 71 95 94 76  Resp: 18 16 (!) 24 (!) 22  Temp:      TempSrc:      SpO2: 100% 100% 99% 92%  Weight:      Height:        Exam: Gen alert, no distress, much better today  No jvd or bruits Chest mild rales at both bases  RRR no RG Abd soft ntnd no mass or ascites +bs Ext bilat 1-2+ pitting LE edema Neuro is alert, Ox 3 , nf          Home meds include - singulair, albuterol, amiodarone, eliquis, advair diskus, midodrine 10mg  tid on non-hd days, velphoro 1 gm tid ac, prns/ vits/ supps        OP HD: SW TTS 4h  450/ 500   70.5kg  2/2 bath  TDC  Hep none - last OP HD 6/11, post wt 77.2kg   - coming off 3-6 kg over last 2 wks, progressive - hectorol 4 mcg IV tiw - parsabiv 7.5mg  IV tiw    CXR 6/11 - IMPRESSION: Stable cardiomegaly and mild to moderate severity interstitial edema with stable mild to moderate severity bibasilar atelectasis.     Assessment/ Plan: V tach - new onset, assoc w/ low BP's, low EF 30-35% and severe MR. Getting IV amio and lidocaine gtt's per cardiology.  Severe bivent CHF - CHF team did cath 6/13 showing severe bivent HF w/ very poor prognosis. Palliative care meeting to be planned.  SOB/ acute hypoxic resp failure / pulm edema - started CRRT on 6/12. Ht is net negative 7 L since admission w/ CRRT and just under his dry wt. Still has LE edema but lying flat w/o SOB.  Hypotension - cont midodrine, will dc CRRT which should help a bit. He can get IV NS boluses for now to get BP > 90.  ESRD - on HD TTS. CRRT started 6/12, taking off today.  Anemia esrd - Hb 12, no esa needs MBD ckd - CCa and phos are in  range. Cont binders and IV vdra PAF - on eliquis, amio H/o CVA - w/ hemiparesis residual COPD    Nathaniel Moselle MD [facility] 06/22/2022, 11:01 AM  Recent Labs  Lab 06/21/22 1536 06/21/22 1755 06/22/22 0031 06/22/22 0032  HGB 15.6  --  16.8  --   ALBUMIN  --  3.6  --  3.9  CALCIUM  --  9.1 9.7 9.7  PHOS  --  3.7  --  3.2  CREATININE  --  4.29* 3.62* 3.71*  K 5.2* 5.8* 4.9 4.9    No results for input(s): "IRON", "TIBC", "FERRITIN" in the last 168 hours. Inpatient medications:  amiodarone  400 mg Oral BID   Followed by   Melene Muller ON 06/29/2022] amiodarone  200 mg Oral BID   apixaban  5 mg Oral BID   Chlorhexidine Gluconate Cloth  6 each Topical Daily   midodrine  10 mg Oral TID   mometasone-formoterol  2 puff Inhalation BID   multivitamin  1 tablet Oral QHS   senna-docusate  1 tablet Oral Daily   sodium chloride flush  3 mL Intravenous Q12H   sodium chloride flush  3 mL Intravenous Q12H   sodium chloride flush  3 mL Intravenous Q12H   sucroferric oxyhydroxide  1,000 mg Oral BID PC    sodium chloride     sodium chloride     sodium chloride, sodium chloride, acetaminophen, albuterol, hydrOXYzine, ondansetron (ZOFRAN) IV, mouth rinse, sodium chloride flush, sodium chloride flush

## 2022-06-23 DIAGNOSIS — I472 Ventricular tachycardia, unspecified: Secondary | ICD-10-CM | POA: Diagnosis not present

## 2022-06-23 LAB — BASIC METABOLIC PANEL
Anion gap: 18 — ABNORMAL HIGH (ref 5–15)
BUN: 36 mg/dL — ABNORMAL HIGH (ref 6–20)
CO2: 19 mmol/L — ABNORMAL LOW (ref 22–32)
Calcium: 10.6 mg/dL — ABNORMAL HIGH (ref 8.9–10.3)
Chloride: 95 mmol/L — ABNORMAL LOW (ref 98–111)
Creatinine, Ser: 4.77 mg/dL — ABNORMAL HIGH (ref 0.61–1.24)
GFR, Estimated: 13 mL/min — ABNORMAL LOW (ref >60)
Glucose, Bld: 72 mg/dL (ref 70–99)
Potassium: 5.7 mmol/L — ABNORMAL HIGH (ref 3.5–5.1)
Sodium: 132 mmol/L — ABNORMAL LOW (ref 135–145)

## 2022-06-23 LAB — MAGNESIUM: Magnesium: 3 mg/dL — ABNORMAL HIGH (ref 1.7–2.4)

## 2022-06-23 NOTE — Progress Notes (Signed)
Rounding Note    Patient Name: Nathaniel White Date of Encounter: 06/23/2022  [facility] Cardiologist: Verne Carrow, MD   Subjective   Patient denies any chest pain or SOB. Currently in HD. Notes that he still feels scared. Reviewed palliative care consult.   Inpatient Medications    Scheduled Meds:  amiodarone  400 mg Oral BID   Followed by   Melene Muller ON 06/29/2022] amiodarone  200 mg Oral BID   apixaban  5 mg Oral BID   Chlorhexidine Gluconate Cloth  6 each Topical Daily   Chlorhexidine Gluconate Cloth  6 each Topical Q0600   midodrine  10 mg Oral TID   mometasone-formoterol  2 puff Inhalation BID   multivitamin  1 tablet Oral QHS   senna-docusate  1 tablet Oral Daily   sodium chloride flush  3 mL Intravenous Q12H   sodium chloride flush  3 mL Intravenous Q12H   sodium chloride flush  3 mL Intravenous Q12H   sucroferric oxyhydroxide  1,000 mg Oral BID PC   Continuous Infusions:  sodium chloride     sodium chloride     PRN Meds: sodium chloride, sodium chloride, acetaminophen, albuterol, heparin, hydrOXYzine, ondansetron (ZOFRAN) IV, mouth rinse, sodium chloride flush, sodium chloride flush   Vital Signs    Vitals:   06/23/22 0830 06/23/22 0900 06/23/22 0930 06/23/22 1000  BP: (!) 89/59 116/72 97/67 (!) 104/54  Pulse: 71 88 98 (!) 35  Resp: (!) 25 20 20 17   Temp:      TempSrc:      SpO2: 100% 99% 96% 100%  Weight:      Height:        Intake/Output Summary (Last 24 hours) at 06/23/2022 1012 Last data filed at 06/23/2022 0316 Gross per 24 hour  Intake 178.04 ml  Output 49 ml  Net 129.04 ml      06/23/2022    8:00 AM 06/23/2022    7:50 AM 06/22/2022    5:30 AM  Last 3 Weights  Weight (lbs) 151 lb 10.8 oz 151 lb 10.8 oz 152 lb 12.5 oz  Weight (kg) 68.8 kg 68.8 kg 69.3 kg      Telemetry    Afib with controlled rate. No further sustained VT - Personally Reviewed  ECG    None  PE: GEN: No acute distress.  Getting CRRT Neck: + JVD  to jaw Cardiac: IRRR, gr 3/6 systolic murmur at apex. Vas cath in left subclavian. Respiratory: clear GI: Soft, nontender, non-distended  MS: No edema; No deformity. Neuro:  Nonfocal  Psych: Normal affect   Labs    High Sensitivity Troponin:   Recent Labs  Lab 06/12/22 1405 06/12/22 1527  TROPONINIHS 89* 92*     Chemistry Recent Labs  Lab 06/21/22 0651 06/21/22 1522 06/21/22 1755 06/22/22 0031 06/22/22 0032 06/23/22 0104  NA 134*   < > 135 134* 134* 132*  K 5.1   < > 5.8* 4.9 4.9 5.7*  CL 97*  --  99 98 97* 95*  CO2 21*  --  21* 20* 21* 19*  GLUCOSE 72  --  67* 99 98 72  BUN 31*  --  23* 21* 21* 36*  CREATININE 4.69*  --  4.29* 3.62* 3.71* 4.77*  CALCIUM 9.5  --  9.1 9.7 9.7 10.6*  MG 2.8*  --   --  2.7*  --  3.0*  ALBUMIN 4.1  --  3.6  --  3.9  --   [facility]  14*  --  15* 19* 18* 13*  ANIONGAP 16*  --  15 16* 16* 18*   < > = values in this interval not displayed.    Lipids No results for input(s): "CHOL", "TRIG", "HDL", "LABVLDL", "LDLCALC", "CHOLHDL" in the last 168 hours.  Hematology Recent Labs  Lab 06/19/22 2242 06/21/22 0949 06/21/22 1522 06/21/22 1536 06/22/22 0031  WBC 5.5 5.1  --   --  4.6  RBC 4.35 5.27  --   --  5.62  HGB 12.9* 15.3 16.7  16.3 15.6 16.8  HCT 42.1 50.3 49.0  48.0 46.0 54.7*  MCV 96.8 95.4  --   --  97.3  MCH 29.7 29.0  --   --  29.9  MCHC 30.6 30.4  --   --  30.7  RDW 17.2* 16.2*  --   --  16.3*  PLT 64* 60*  --   --  65*   Thyroid  Recent Labs  Lab 06/19/22 2242  TSH 2.460    BNPNo results for input(s): "BNP", "PROBNP" in the last 168 hours.  DDimer No results for input(s): "DDIMER" in the last 168 hours.   Radiology    CARDIAC CATHETERIZATION  Result Date: 06/21/2022   LV end diastolic pressure is mildly elevated.   Patient is on Eliquis with A-fib history. Nonischemic dilated cardiomyopathy with essentially normal coronary arteries. Severely elevated right heart pressures with RV pressure 81/10, and mean pulmonary  artery pressure at 46. Elevated SVR and PVR. RECOMMENDATIONS: Patient was admitted with new onset ventricular tachycardia, currently on amiodarone and lidocaine.  He currently is in atrial fibrillation.  He has significant volume overload and CRRT was initiated yesterday.  Plan to resume postprocedure.  Advanced heart failure team has also been notified in light of his significant right heart pressure elevations.  He has been documented to have moderately severe mitral regurgitation on echocardiography with a EF at 30 to 35%.    Cardiac Studies   Echo 06/19/22: IMPRESSIONS     1. Left ventricular ejection fraction, by estimation, is 30 to 35%. The  left ventricle has moderately decreased function. The left ventricle has  no regional wall motion abnormalities. Left ventricular diastolic function  could not be evaluated.   2. Right ventricular systolic function is mildly reduced. The right  ventricular size is mildly enlarged.   3. Left atrial size was severely dilated.   4. Right atrial size was mildly dilated.   5. The mitral valve is abnormal. Moderate to severe mitral valve  regurgitation due to poor leaflet coaptation and a restricted posterior  leaflet. By quantitative measures, MR is severe with an EROA of 0.54cm2  and regurgitant volume of 72ml.   6. The aortic valve is normal in structure. Aortic valve regurgitation is  not visualized. No aortic stenosis is present.   7. The inferior vena cava is normal in size with greater than 50%  respiratory variability, suggesting right atrial pressure of 3 mmHg.    Patient Profile     62 y.o. male  with ESRD on dialysis Tuesday, Thursday, and Saturday, HFrEF with EF of 30 to 35%- nonischemic, mod- severe MR, chronic atrial fibrillation, and chronic hypertension who is being seen 06/20/2022 for the evaluation of wide-complex tachycardia.   Assessment & Plan    Wide complex tachycardia most c/w VT in setting of nonischemic CM. Electrolytes  grossly in range. Patient's volume status improved with CRRT. Has persistent AFib with controlled rate. Had been on lidocaine and  amiodarone IV. DC'd lidocaine. Transition amiodarone to po. 400 mg daily for 1 week then 200 mg daily.  Last heart cath in 2015. Resume Eliquis for Afib ESRD on HD.  Appreciate Nephrology input. Has pulled off good fluid last 3 days but now hypotensive. Will increase midodrine to 10 mg tid. Hypotension is going to be an ongoing issue with his endstage CHF. Acute on Chronic systolic CHF with cardiogenic shock and advance RV/LV failure. Appreciate Advanced heart failure team input. Nothing to offer from a medication or advanced therapies standpoint. No CAD. Plan palliative care consult. Prognosis very poor. Family meeting planned.  Chronic mod to severe MR- functional Afib persistent/chronic.      For questions or updates, please contact [facility] Please consult [email or web address] for contact info under        Signed, Parke Poisson, MD  06/23/2022, 10:12 AM

## 2022-06-23 NOTE — Progress Notes (Addendum)
[facility] Progress Note   Subjective:  Seen on HD - no UF, hypotensive (stable) - seems to be tolerating  ok for now (this is with no volume removal attempt though). Denies CP, c/o chronic mild dyspnea and dizziness.  Objective Vitals:   06/23/22 0800 06/23/22 0830 06/23/22 0900 06/23/22 0930  BP: 105/77 (!) 89/59 116/72 97/67  Pulse: 67 71 88 98  Resp: 18 (!) 25 20 20   Temp: (!) 96.8 F (36 C)     TempSrc:      SpO2: 100% 100% 99% 96%  Weight: 68.8 kg     Height:       Physical Exam General: Chronically ill appearing man, NAD. Nasal O2 in place Heart: Irregularly irregular Lungs: CTA anteriorly, dull B bases Abdomen: soft, non-tender Extremities: 1+ BLE edema Dialysis Access: [facility]  Additional Objective Labs: Basic Metabolic Panel: Recent Labs  Lab 06/21/22 0651 06/21/22 1522 06/21/22 1755 06/22/22 0031 06/22/22 0032 06/23/22 0104  NA 134*   < > 135 134* 134* 132*  K 5.1   < > 5.8* 4.9 4.9 5.7*  CL 97*  --  99 98 97* 95*  CO2 21*  --  21* 20* 21* 19*  GLUCOSE 72  --  67* 99 98 72  BUN 31*  --  23* 21* 21* 36*  CREATININE 4.69*  --  4.29* 3.62* 3.71* 4.77*  CALCIUM 9.5  --  9.1 9.7 9.7 10.6*  PHOS 3.2  --  3.7  --  3.2  --    < > = values in this interval not displayed.   Liver Function Tests: Recent Labs  Lab 06/21/22 0651 06/21/22 1755 06/22/22 0032  ALBUMIN 4.1 3.6 3.9   CBC: Recent Labs  Lab 06/19/22 2242 06/21/22 0949 06/21/22 1522 06/21/22 1536 06/22/22 0031  WBC 5.5 5.1  --   --  4.6  NEUTROABS 3.9  --   --   --   --   HGB 12.9* 15.3 16.7  16.3 15.6 16.8  HCT 42.1 50.3 49.0  48.0 46.0 54.7*  MCV 96.8 95.4  --   --  97.3  PLT 64* 60*  --   --  65*   Studies/Results: CARDIAC CATHETERIZATION  Result Date: 06/21/2022   LV end diastolic pressure is mildly elevated.   Patient is on Eliquis with A-fib history. Nonischemic dilated cardiomyopathy with essentially normal coronary arteries. Severely elevated right heart  pressures with RV pressure 81/10, and mean pulmonary artery pressure at 46. Elevated SVR and PVR. RECOMMENDATIONS: Patient was admitted with new onset ventricular tachycardia, currently on amiodarone and lidocaine.  He currently is in atrial fibrillation.  He has significant volume overload and CRRT was initiated yesterday.  Plan to resume postprocedure.  Advanced heart failure team has also been notified in light of his significant right heart pressure elevations.  He has been documented to have moderately severe mitral regurgitation on echocardiography with a EF at 30 to 35%.    Medications:  sodium chloride     sodium chloride      amiodarone  400 mg Oral BID   Followed by   Melene Muller ON 06/29/2022] amiodarone  200 mg Oral BID   apixaban  5 mg Oral BID   Chlorhexidine Gluconate Cloth  6 each Topical Daily   Chlorhexidine Gluconate Cloth  6 each Topical Q0600   midodrine  10 mg Oral TID   mometasone-formoterol  2 puff Inhalation BID   multivitamin  1 tablet Oral QHS  senna-docusate  1 tablet Oral Daily   sodium chloride flush  3 mL Intravenous Q12H   sodium chloride flush  3 mL Intravenous Q12H   sodium chloride flush  3 mL Intravenous Q12H   sucroferric oxyhydroxide  1,000 mg Oral BID PC    Dialysis Orders: TTS at AF 4:15hr, 450/500, EDW 70.5kg, 2K/2Ca, TDC, no heparin - Last HD 6/11, unable tp reach EDW for > 2 weeks, last post-wt 77.2kg - Hectoral IV q HD - Parsabiv 7.5mg  IV q HD  Assessment/Plan: Cardiogenic shock: Presented to ED with VT. Cardiology consulted - s/p IV amiodarone and lidocaine drips, now changed to high dose PO amiodarone + Eliquis. Biventricular HF (EF 25-30%), severe MR and TR on RHC 6/13.  Hypotension/volume: Able to get decent amount of volume off with CRRT, now below prior EDW, still with some edema Trying regular HD today with no UF to ease him back into it - ok so far. On midodrine 10mg  TID now. Dyspnea/pulm edema: In setting of overload and #1. ESRD:  CRRT 6/12- 6/14 due to the above symptoms - changing back to iHD as of today. No UF today. Anemia of ERSD: Hgb quite high > 16. Looking through chart - he has significant acquired cystic kidney disease (as of CT 2023) - likely the cause of the high Hgb. Would recommend re-imaging the kidneys, more than likely has developing RCC, although with the above issues palliative care is involved, so may or may not change the outcome for him. Secondary HPTH: Ca high, VDRA on hold. Parsabiv not on [facility] formulary. Continue binders. A-flutter/fib: On Eliquis, amio Hx CVA with residual hemiparesis COPD GOC: Appreciate palliative care involvement, family meeting planned for 6/16 - will plan for someone from our renal team to attend.    Ozzie Hoyle, PA-C 06/23/2022, 9:47 AM  [employer]

## 2022-06-23 NOTE — Progress Notes (Addendum)
   Palliative Medicine Inpatient Follow Up Note HPI: 62 y.o. male  with ESRD on dialysis Tuesday, Thursday, and Saturday, HFrEF with EF of 30 to 35%- nonischemic, mod- severe MR, chronic atrial fibrillation, and chronic hypertension who is being seen 06/20/2022 for the evaluation of wide-complex tachycardia.    Palliative care has been asked to get involved for further goals of care conversations in the setting of end-stage biventricular heart failure with cardiogenic shock.  Today's Discussion 06/23/2022  *Please note that this is a verbal dictation therefore any spelling or grammatical errors are due to the "Dragon Medical One" system interpretation.  Chart reviewed inclusive of vital signs, progress notes, laboratory results, and diagnostic images.   I met with Cinsere at beside this afternoon, noted to be having runs of PVC's/VT. He shares that he went to dialysis today and tolerated it pretty well, without the "pressure support." I shared that I noted that though they did not pull volume off of him.   Created space and opportunity for patient to explore thoughts feelings and fears regarding current medical situation.He expresses sadness and fear related to the future. I was able to sit beside him and offer support through therapeutic listening. He expresses that he had never seen his father cry until last night. This weighs heavily on him. I acknowledged how hard that must have been for him to see. He was tearful with me. I shared that whatever the next step in life it that he clearly is surrounded by people who live and support him.   WE reviewed the plan for a more formalized family meeting tomorrow afternoon allowing Shea's family to get a formal medical update at one time and help him make decisions regarding next steps.   Questions and concerns addressed/Palliative Support Provided.   Objective Assessment: Vital Signs Vitals:   06/23/22 1103 06/23/22 1202  BP: 94/70 (!) 89/54   Pulse: 92 74  Resp: (!) 0 20  Temp:  (!) 97.5 F (36.4 C)  SpO2: 99% 99%    Intake/Output Summary (Last 24 hours) at 06/23/2022 1236 Last data filed at 06/23/2022 0316 Gross per 24 hour  Intake 145 ml  Output 0 ml  Net 145 ml   Last Weight  Most recent update: 06/23/2022  8:23 AM    Weight  68.8 kg (151 lb 10.8 oz)            Gen: Middle-aged African-American male in no acute distress HEENT: moist mucous membranes CV: Regular rate and irregular rhythm PULM: On 3 L nasal cannula breathing is even and unlabored ABD: soft/nontender EXT: No edema Neuro: Alert and oriented x3  SUMMARY OF RECOMMENDATIONS   DNAR/DNI   Plan for family meeting on 1PM on Sunday - family would appreciate nephrology and cardiology  presence   The topic of hospice has been gently broached with the patient   Ongoing palliative care support  Billing based on MDM: High ______________________________________________________________________________________ Lamarr Lulas [place] Palliative Medicine Team Team Cell Phone: [phone] Please utilize secure chat with additional questions, if there is no response within 30 minutes please call the above phone number  Palliative Medicine Team providers are available by phone from 7am to 7pm daily and can be reached through the team cell phone.  Should this patient require assistance outside of these hours, please call the patient's attending physician.

## 2022-06-24 DIAGNOSIS — I472 Ventricular tachycardia, unspecified: Secondary | ICD-10-CM | POA: Diagnosis not present

## 2022-06-24 LAB — MAGNESIUM: Magnesium: 2.7 mg/dL — ABNORMAL HIGH (ref 1.7–2.4)

## 2022-06-24 LAB — GLUCOSE, CAPILLARY: Glucose-Capillary: 124 mg/dL — ABNORMAL HIGH (ref 70–99)

## 2022-06-24 MED ORDER — CHLORHEXIDINE GLUCONATE CLOTH 2 % EX PADS
6.0000 | MEDICATED_PAD | Freq: Every day | CUTANEOUS | Status: DC
  Administered 2022-06-25 – 2022-06-26 (×2): 6 via TOPICAL

## 2022-06-24 NOTE — Progress Notes (Signed)
[facility] Progress Note   Subjective:  Seen in room - says did ok overnight. BP decent this AM on monitor. We talked for a bit yesterday in preparation for family meeting today - explained that really has not been tolerating dialysis well as outpatient and here initially, fluid kept building up which led to hospitalization and needed CRRT to get the volume in check. He did ok with HD yesterday but was cheating a little because we didn't pull any fluid. He knows that his heart is weak - has been on HD for 20+ years (aside from 1 year with working transplant 2010-2011). Despite this - he is not ready to die, he is scared but wants to keep going if possible.   Objective Vitals:   06/24/22 0845 06/24/22 0900 06/24/22 0915 06/24/22 0930  BP:  104/65    Pulse: 61 (!) 58 (!) 58 70  Resp: 19 14 (!) 24 18  Temp:      TempSrc:      SpO2: 100% 100% 100% 100%  Weight:      Height:       Physical Exam General: Chronically ill appearing man, NAD. Room air Heart: Irregularly irregular Lungs: CTA anteriorly, dull B bases Abdomen: soft, non-tender Extremities: 1+ BLE edema Dialysis Access: [facility]  Additional Objective Labs: Basic Metabolic Panel: Recent Labs  Lab 06/21/22 0651 06/21/22 1522 06/21/22 1755 06/22/22 0031 06/22/22 0032 06/23/22 0104  NA 134*   < > 135 134* 134* 132*  K 5.1   < > 5.8* 4.9 4.9 5.7*  CL 97*  --  99 98 97* 95*  CO2 21*  --  21* 20* 21* 19*  GLUCOSE 72  --  67* 99 98 72  BUN 31*  --  23* 21* 21* 36*  CREATININE 4.69*  --  4.29* 3.62* 3.71* 4.77*  CALCIUM 9.5  --  9.1 9.7 9.7 10.6*  PHOS 3.2  --  3.7  --  3.2  --    < > = values in this interval not displayed.   Liver Function Tests: Recent Labs  Lab 06/21/22 0651 06/21/22 1755 06/22/22 0032  ALBUMIN 4.1 3.6 3.9   CBC: Recent Labs  Lab 06/19/22 2242 06/21/22 0949 06/21/22 1522 06/21/22 1536 06/22/22 0031  WBC 5.5 5.1  --   --  4.6  NEUTROABS 3.9  --   --   --   --   HGB 12.9*  15.3 16.7  16.3 15.6 16.8  HCT 42.1 50.3 49.0  48.0 46.0 54.7*  MCV 96.8 95.4  --   --  97.3  PLT 64* 60*  --   --  65*   Medications:  sodium chloride     sodium chloride      amiodarone  400 mg Oral BID   Followed by   Melene Muller ON 06/29/2022] amiodarone  200 mg Oral BID   apixaban  5 mg Oral BID   Chlorhexidine Gluconate Cloth  6 each Topical Daily   Chlorhexidine Gluconate Cloth  6 each Topical Q0600   midodrine  10 mg Oral TID   mometasone-formoterol  2 puff Inhalation BID   multivitamin  1 tablet Oral QHS   senna-docusate  1 tablet Oral Daily   sodium chloride flush  3 mL Intravenous Q12H   sodium chloride flush  3 mL Intravenous Q12H   sodium chloride flush  3 mL Intravenous Q12H   sucroferric oxyhydroxide  1,000 mg Oral BID PC    Dialysis Orders: TTS  at AF 4:15hr, 450/500, EDW 70.5kg, 2K/2Ca, TDC, no heparin - Last HD 6/11, unable tp reach EDW for > 2 weeks, last post-wt 77.2kg - Hectoral IV q HD - Parsabiv 7.5mg  IV q HD   Assessment/Plan: Cardiogenic shock: Presented to ED with VT. Cardiology consulted - s/p IV amiodarone and lidocaine drips, now changed to high dose PO amiodarone + Eliquis. Biventricular HF (EF 25-30%), severe MR and TR on RHC 6/13.  Hypotension/volume: Able to get decent amount of volume off with CRRT, now below prior EDW, still with some edema. S/p regular HD 6/15 with no UF to ease him back into it - did fine. On midodrine 10mg  TID now. Dyspnea/pulm edema: In setting of overload and #1. ESRD: CRRT 6/12- 6/14 due to the above symptoms - tolerated HD 6/15, but short run and no UF. Next HD Tues - will attempt UF/full HD that day - see how tolerates. Anemia of ERSD: Hgb quite high > 16. Looking through chart - he has significant acquired cystic kidney disease (as of CT 2023) - likely the cause of the high Hgb. Would recommend re-imaging the kidneys, more than likely has developing RCC, although with the above issues palliative care is involved, so may  or may not change the outcome for him. Secondary HPTH: Ca high, VDRA on hold. Parsabiv not on [facility] formulary. Continue binders. A-flutter/fib: On Eliquis, amio Hx CVA with residual hemiparesis COPD GOC: Appreciate palliative care involvement, family meeting planned for 6/16 - will plan for someone from our renal team to attend.    Ozzie Hoyle, PA-C 06/24/2022, 9:45 AM  [employer]

## 2022-06-24 NOTE — Progress Notes (Signed)
   06/24/22 1137  Spiritual Encounters  Type of Visit Initial  Care provided to: Patient  Referral source Patient request (Spiritual Care Consult)  Reason for visit Routine spiritual support  OnCall Visit Yes  Spiritual Framework  Presenting Themes Meaning/purpose/sources of inspiration;Goals in life/care;Rituals and practive;Community and relationships (Pt spoke a great deal about his father (deceased).)  Community/Connection Family;Friend(s) (Pt's son and sister have been visiting him as well as a close male friend.)  Patient Stress Factors Health changes  Goals  Self/Personal Goals States that he wants to "start working out again" in hope that this might prolong his life.  Interventions  Spiritual Care Interventions Made Established relationship of care and support;Compassionate presence;Reflective listening;Prayer  Intervention Outcomes  Outcomes Connection to spiritual care  Spiritual Care Plan  Spiritual Care Issues Still Outstanding No further spiritual care needs at this time (see row info)  Recommendations for Clinical Staff Please contact Spiritual Care should pt or any family become emotional requiring support. Chaplain also advised pt to request chaplain visit if he needed to talk further or needed somebody to pray with him.  Advance Directives (For Healthcare)  Does Patient Have a Medical Advance Directive? No  Would patient like information on creating a medical advance directive? No - Patient declined (Pt states that his sister is aware that she's supposed to handle his accounts, etc.)

## 2022-06-24 NOTE — Progress Notes (Signed)
Palliative Medicine Inpatient Follow Up Note HPI: 62 y.o. male  with ESRD on dialysis Tuesday, Thursday, and Saturday, HFrEF with EF of 30 to 35%- nonischemic, mod- severe MR, chronic atrial fibrillation, and chronic hypertension who is being seen 06/20/2022 for the evaluation of wide-complex tachycardia.    Palliative care has been asked to get involved for further goals of care conversations in the setting of end-stage biventricular heart failure with cardiogenic shock.  Today's Discussion 06/24/2022  *Please note that this is a verbal dictation therefore any spelling or grammatical errors are due to the "Dragon Medical One" system interpretation.  Chart reviewed inclusive of vital signs, progress notes, laboratory results, and diagnostic images.   Family meeting held this afternoon with patients mother [name], father Lenna Gilford, sister Marylene Land, son Will, aunt, and niece.   Providers present were Dr. Arlean Hopping, Tommi Emery, PA, Dr. Jacques Navy, and myself.  I introduced Palliative Medicine as specialized medical care for people living with serious illness. It focuses on providing relief from the symptoms and stress of a serious illness. The goal is to improve quality of life for both the patient and the family.   Dr. Jacques Navy was able to discuss with patients family Valeria's underlying cardiac disease - his notably weak heart due to heart failure and ongoing arrhythmia(s) due to the stress of dialysis. She shared that every dialysis session is taxing on the heart due to rapid fluid/electrolyte shifts.   The nephrology team, Samara Deist was able to speak to [employer] dialysis tolerance. She shares that when fluid is pulled it tends to cause hypotension. This is something whereby completing dialysis sessions are inhibited. Reviewed that if continued it may get to a point whereby dialysis is not tolerable any longer. Habram has been receiving tx for > 2 decades we discussed the toll that has on his body and  vessels.  Patients family concerned about patients diet and fluid intake. We discussed nutritions involvement.   We reviewed that tomorrow's HD session will be telling in terms of what the future may look like. At this time, Armour would like to continue with hemodialysis if his body can tolerate it.   Questions and concerns addressed/Palliative Support Provided.   Objective Assessment: Vital Signs Vitals:   06/24/22 0930 06/24/22 1236  BP:  93/68  Pulse: 70   Resp: 18 19  Temp:  97.6 F (36.4 C)  SpO2: 100%    No intake or output data in the 24 hours ending 06/24/22 1408  Last Weight  Most recent update: 06/23/2022  8:23 AM    Weight  68.8 kg (151 lb 10.8 oz)            Gen: Middle-aged African-American male in no acute distress HEENT: moist mucous membranes CV: Regular rate and irregular rhythm PULM: On RA, breathing is even and unlabored ABD: soft/nontender EXT: No edema Neuro: Alert and oriented x3  SUMMARY OF RECOMMENDATIONS   DNAR/DNI   Plan to see how hemodialysis treatment goes tomorrow --> additional conversation will be based upon this   Ongoing palliative care support  Time: 43 Billing based on MDM: High ______________________________________________________________________________________ Lamarr Lulas [place] Palliative Medicine Team Team Cell Phone: [phone] Please utilize secure chat with additional questions, if there is no response within 30 minutes please call the above phone number  Palliative Medicine Team providers are available by phone from 7am to 7pm daily and can be reached through the team cell phone.  Should this patient require assistance outside of these hours,  please call the patient's attending physician.

## 2022-06-24 NOTE — Progress Notes (Signed)
Rounding Note    Patient Name: Nathaniel White Date of Encounter: 06/24/2022  [facility] Cardiologist: Verne Carrow, MD   Subjective   Patient denies any chest pain or SOB. Anticipating family meeting at 1 pm today.   Inpatient Medications    Scheduled Meds:  amiodarone  400 mg Oral BID   Followed by   Melene Muller ON 06/29/2022] amiodarone  200 mg Oral BID   apixaban  5 mg Oral BID   Chlorhexidine Gluconate Cloth  6 each Topical Daily   Chlorhexidine Gluconate Cloth  6 each Topical Q0600   midodrine  10 mg Oral TID   mometasone-formoterol  2 puff Inhalation BID   multivitamin  1 tablet Oral QHS   senna-docusate  1 tablet Oral Daily   sodium chloride flush  3 mL Intravenous Q12H   sodium chloride flush  3 mL Intravenous Q12H   sodium chloride flush  3 mL Intravenous Q12H   sucroferric oxyhydroxide  1,000 mg Oral BID PC   Continuous Infusions:  sodium chloride     sodium chloride     PRN Meds: sodium chloride, sodium chloride, acetaminophen, albuterol, heparin, hydrOXYzine, ondansetron (ZOFRAN) IV, mouth rinse, sodium chloride flush, sodium chloride flush   Vital Signs    Vitals:   06/24/22 0845 06/24/22 0900 06/24/22 0915 06/24/22 0930  BP:  104/65    Pulse: 61 (!) 58 (!) 58 70  Resp: 19 14 (!) 24 18  Temp:      TempSrc:      SpO2: 100% 100% 100% 100%  Weight:      Height:        Intake/Output Summary (Last 24 hours) at 06/24/2022 0941 Last data filed at 06/23/2022 1325 Gross per 24 hour  Intake 240 ml  Output 1 ml  Net 239 ml      06/23/2022    8:00 AM 06/23/2022    7:50 AM 06/22/2022    5:30 AM  Last 3 Weights  Weight (lbs) 151 lb 10.8 oz 151 lb 10.8 oz 152 lb 12.5 oz  Weight (kg) 68.8 kg 68.8 kg 69.3 kg      Telemetry    Afib with controlled rate. No further sustained VT - Personally Reviewed  ECG    None  PE: GEN: No acute distress.   Neck: no JVD Cardiac: IRRR, gr 3/6 systolic murmur at apex.  Respiratory: clear GI:  Soft, nontender, non-distended  MS: No edema; No deformity. Neuro:  Nonfocal  Psych: Normal affect   Labs    High Sensitivity Troponin:   Recent Labs  Lab 06/12/22 1405 06/12/22 1527  TROPONINIHS 89* 92*     Chemistry Recent Labs  Lab 06/21/22 0651 06/21/22 1522 06/21/22 1755 06/22/22 0031 06/22/22 0032 06/23/22 0104 06/24/22 0135  NA 134*   < > 135 134* 134* 132*  --   K 5.1   < > 5.8* 4.9 4.9 5.7*  --   CL 97*  --  99 98 97* 95*  --   CO2 21*  --  21* 20* 21* 19*  --   GLUCOSE 72  --  67* 99 98 72  --   BUN 31*  --  23* 21* 21* 36*  --   CREATININE 4.69*  --  4.29* 3.62* 3.71* 4.77*  --   CALCIUM 9.5  --  9.1 9.7 9.7 10.6*  --   MG 2.8*  --   --  2.7*  --  3.0* 2.7*  ALBUMIN 4.1  --  3.6  --  3.9  --   --   GFRNONAA 14*  --  15* 19* 18* 13*  --   ANIONGAP 16*  --  15 16* 16* 18*  --    < > = values in this interval not displayed.    Lipids No results for input(s): "CHOL", "TRIG", "HDL", "LABVLDL", "LDLCALC", "CHOLHDL" in the last 168 hours.  Hematology Recent Labs  Lab 06/19/22 2242 06/21/22 0949 06/21/22 1522 06/21/22 1536 06/22/22 0031  WBC 5.5 5.1  --   --  4.6  RBC 4.35 5.27  --   --  5.62  HGB 12.9* 15.3 16.7  16.3 15.6 16.8  HCT 42.1 50.3 49.0  48.0 46.0 54.7*  MCV 96.8 95.4  --   --  97.3  MCH 29.7 29.0  --   --  29.9  MCHC 30.6 30.4  --   --  30.7  RDW 17.2* 16.2*  --   --  16.3*  PLT 64* 60*  --   --  65*   Thyroid  Recent Labs  Lab 06/19/22 2242  TSH 2.460    BNPNo results for input(s): "BNP", "PROBNP" in the last 168 hours.  DDimer No results for input(s): "DDIMER" in the last 168 hours.   Radiology    No results found.  Cardiac Studies   Echo 06/19/22: IMPRESSIONS     1. Left ventricular ejection fraction, by estimation, is 30 to 35%. The  left ventricle has moderately decreased function. The left ventricle has  no regional wall motion abnormalities. Left ventricular diastolic function  could not be evaluated.   2. Right  ventricular systolic function is mildly reduced. The right  ventricular size is mildly enlarged.   3. Left atrial size was severely dilated.   4. Right atrial size was mildly dilated.   5. The mitral valve is abnormal. Moderate to severe mitral valve  regurgitation due to poor leaflet coaptation and a restricted posterior  leaflet. By quantitative measures, MR is severe with an EROA of 0.54cm2  and regurgitant volume of 72ml.   6. The aortic valve is normal in structure. Aortic valve regurgitation is  not visualized. No aortic stenosis is present.   7. The inferior vena cava is normal in size with greater than 50%  respiratory variability, suggesting right atrial pressure of 3 mmHg.    Patient Profile     62 y.o. male  with ESRD on dialysis Tuesday, Thursday, and Saturday, HFrEF with EF of 30 to 35%- nonischemic, mod- severe MR, chronic atrial fibrillation, and chronic hypertension who is being seen 06/20/2022 for the evaluation of wide-complex tachycardia.   Assessment & Plan    Wide complex tachycardia most c/w VT in setting of nonischemic CM. Electrolytes grossly in range. Patient's volume status improved with CRRT. Has persistent AFib with controlled rate. Had been on lidocaine and amiodarone IV. DC'd lidocaine. Transition amiodarone to po. 400 mg daily for 1 week then 200 mg daily.  Last heart cath in 2015. Resume Eliquis for Afib. He did have short frequent runs of NSVT after dialysis yesterday, monitor closely. Bradycardic at times in afib, cannot add further therapy. ESRD on HD.  Appreciate Nephrology input. Has had hypotension. Will increase midodrine to 10 mg tid. Hypotension is going to be an ongoing issue with his endstage CHF. Acute on Chronic systolic CHF with cardiogenic shock and advance RV/LV failure. Appreciate Advanced heart failure team input. Nothing to offer from a medication or advanced therapies standpoint. No  CAD. Plan palliative care consult. Prognosis very poor. Family  meeting planned for today at 1 pm.  Chronic mod to severe MR- functional Afib persistent/chronic.      For questions or updates, please contact [facility] Please consult [email or web address] for contact info under        Signed, Parke Poisson, MD  06/24/2022, 9:41 AM

## 2022-06-25 DIAGNOSIS — I472 Ventricular tachycardia, unspecified: Secondary | ICD-10-CM | POA: Diagnosis not present

## 2022-06-25 LAB — CBC
HCT: 49.7 % (ref 39.0–52.0)
Hemoglobin: 15.5 g/dL (ref 13.0–17.0)
MCH: 28.8 pg (ref 26.0–34.0)
MCHC: 31.2 g/dL (ref 30.0–36.0)
MCV: 92.4 fL (ref 80.0–100.0)
Platelets: 101 10*3/uL — ABNORMAL LOW (ref 150–400)
RBC: 5.38 MIL/uL (ref 4.22–5.81)
RDW: 16 % — ABNORMAL HIGH (ref 11.5–15.5)
WBC: 8.6 10*3/uL (ref 4.0–10.5)
nRBC: 0 % (ref 0.0–0.2)

## 2022-06-25 LAB — BASIC METABOLIC PANEL
Anion gap: 19 — ABNORMAL HIGH (ref 5–15)
BUN: 65 mg/dL — ABNORMAL HIGH (ref 6–20)
CO2: 24 mmol/L (ref 22–32)
Calcium: 10 mg/dL (ref 8.9–10.3)
Chloride: 88 mmol/L — ABNORMAL LOW (ref 98–111)
Creatinine, Ser: 7.99 mg/dL — ABNORMAL HIGH (ref 0.61–1.24)
GFR, Estimated: 7 mL/min — ABNORMAL LOW (ref >60)
Glucose, Bld: 91 mg/dL (ref 70–99)
Potassium: 5.8 mmol/L — ABNORMAL HIGH (ref 3.5–5.1)
Sodium: 131 mmol/L — ABNORMAL LOW (ref 135–145)

## 2022-06-25 LAB — MAGNESIUM: Magnesium: 2.7 mg/dL — ABNORMAL HIGH (ref 1.7–2.4)

## 2022-06-25 MED ORDER — NEPRO/CARBSTEADY PO LIQD
237.0000 mL | Freq: Two times a day (BID) | ORAL | Status: DC
  Administered 2022-06-26 – 2022-06-27 (×3): 237 mL via ORAL

## 2022-06-25 MED ORDER — HEPARIN SODIUM (PORCINE) 1000 UNIT/ML IJ SOLN
INTRAMUSCULAR | Status: AC
  Administered 2022-06-25: 1000 [IU]
  Filled 2022-06-25: qty 5

## 2022-06-25 NOTE — Plan of Care (Signed)
  Problem: Education: Goal: Knowledge of General Education information will improve Description: Including pain rating scale, medication(s)/side effects and non-pharmacologic comfort measures Outcome: Progressing   Problem: Activity: Goal: Risk for activity intolerance will decrease Outcome: Progressing   Problem: Coping: Goal: Level of anxiety will decrease Outcome: Progressing   

## 2022-06-25 NOTE — Progress Notes (Signed)
   06/25/22 1309  Vitals  Temp 97.8 F (36.6 C)  Pulse Rate 69  Resp 18  BP 95/66  SpO2 98 %  O2 Device Nasal Cannula  Oxygen Therapy  O2 Flow Rate (L/min) 3 L/min  Patient Activity (if Appropriate) In bed  Pulse Oximetry Type Continuous  Post Treatment  Dialyzer Clearance Clear  Duration of HD Treatment -hour(s) 4 hour(s)  Hemodialysis Intake (mL) 0 mL  Liters Processed 71.9  Fluid Removed (mL) 1.1 mL  Tolerated HD Treatment Yes  Post-Hemodialysis Comments Pt . tolerated Tx in the beginning, and in mid-tx pt. SBP begin to decrease, but pt. never became symptomatic. Report call to 4E bedside RN  Cletis [place]   Received patient in bed to unit.  Alert and oriented.  Informed consent signed and in chart.   TX duration: 4  Patient tolerated well.  Transported back to the room  Alert, without acute distress.  Hand-off given to patient's nurse.   Access used: Yes Access issues: No  Total UF removed: 1.1 L Medication(s) given: See MAR Post HD VS: See Above Grid Post HD weight: 70.5 kg   [facility]

## 2022-06-25 NOTE — Procedures (Signed)
Patient seen and examined on Hemodialysis. The procedure was supervised and I have made appropriate changes. BP (!) 99/56   Pulse 72   Temp 98.9 F (37.2 C)   Resp 16   Ht 5\' 9"  (1.753 m)   Wt 72.7 kg   SpO2 97%   BMI 23.67 kg/m   QB 400 mL/ min via TDC, UF goal 2L  Tolerating treatment without complaints at this time.  BP seems to be holding- will see what final UF was.     Bufford Buttner MD [facility] [phone] 11:05 AM

## 2022-06-25 NOTE — Progress Notes (Signed)
This chaplain responded to PMT NP-MYF consult for notarizing the Pt. Advance Directive. The Pt. is out of the room for HD.  The chaplain will attempt a revisit at another time.  Chaplain Stephanie Acre [phone]

## 2022-06-25 NOTE — Progress Notes (Signed)
[facility] Progress Note   Subjective:  Seen in room - says did ok overnight. BP decent this AM on monitor. We talked for a bit yesterday in preparation for family meeting today - explained that really has not been tolerating dialysis well as outpatient and here initially, fluid kept building up which led to hospitalization and needed CRRT to get the volume in check. He did ok with HD yesterday but was cheating a little because we didn't pull any fluid. He knows that his heart is weak - has been on HD for 20+ years (aside from 1 year with working transplant 2010-2011). Despite this - he is not ready to die, he is scared but wants to keep going if possible.   Objective Vitals:   06/25/22 0930 06/25/22 0944 06/25/22 1000 06/25/22 1033  BP: 97/77 98/78 109/78 (!) 99/56  Pulse: 63  64 72  Resp: 20   16  Temp:      TempSrc:      SpO2: 97%  96% 97%  Weight:      Height:       Physical Exam General: Chronically ill appearing man, NAD. Room air Heart: Irregularly irregular Lungs: CTA anteriorly, dull B bases Abdomen: soft, non-tender Extremities: 1+ BLE edema Dialysis Access: [facility]  Additional Objective Labs: Basic Metabolic Panel: Recent Labs  Lab 06/21/22 0651 06/21/22 1522 06/21/22 1755 06/22/22 0031 06/22/22 0032 06/23/22 0104 06/25/22 0144  NA 134*   < > 135   < > 134* 132* 131*  K 5.1   < > 5.8*   < > 4.9 5.7* 5.8*  CL 97*  --  99   < > 97* 95* 88*  CO2 21*  --  21*   < > 21* 19* 24  GLUCOSE 72  --  67*   < > 98 72 91  BUN 31*  --  23*   < > 21* 36* 65*  CREATININE 4.69*  --  4.29*   < > 3.71* 4.77* 7.99*  CALCIUM 9.5  --  9.1   < > 9.7 10.6* 10.0  PHOS 3.2  --  3.7  --  3.2  --   --    < > = values in this interval not displayed.   Liver Function Tests: Recent Labs  Lab 06/21/22 0651 06/21/22 1755 06/22/22 0032  ALBUMIN 4.1 3.6 3.9   CBC: Recent Labs  Lab 06/19/22 2242 06/21/22 0949 06/21/22 1522 06/21/22 1536 06/22/22 0031 06/25/22 0144   WBC 5.5 5.1  --   --  4.6 8.6  NEUTROABS 3.9  --   --   --   --   --   HGB 12.9* 15.3   < > 15.6 16.8 15.5  HCT 42.1 50.3   < > 46.0 54.7* 49.7  MCV 96.8 95.4  --   --  97.3 92.4  PLT 64* 60*  --   --  65* 101*   < > = values in this interval not displayed.   Medications:  sodium chloride     sodium chloride      amiodarone  400 mg Oral BID   Followed by   Melene Muller ON 06/29/2022] amiodarone  200 mg Oral BID   apixaban  5 mg Oral BID   Chlorhexidine Gluconate Cloth  6 each Topical Daily   Chlorhexidine Gluconate Cloth  6 each Topical Q0600   Chlorhexidine Gluconate Cloth  6 each Topical Q0600   heparin sodium (porcine)  midodrine  10 mg Oral TID   mometasone-formoterol  2 puff Inhalation BID   multivitamin  1 tablet Oral QHS   senna-docusate  1 tablet Oral Daily   sodium chloride flush  3 mL Intravenous Q12H   sodium chloride flush  3 mL Intravenous Q12H   sodium chloride flush  3 mL Intravenous Q12H   sucroferric oxyhydroxide  1,000 mg Oral BID PC    Dialysis Orders: TTS at [facility] 4:15hr, 450/500, EDW 70.5kg, 2K/2Ca, TDC, no heparin - Last HD 6/11, unable tp reach EDW for > 2 weeks, last post-wt 77.2kg - Hectoral IV q HD - Parsabiv 7.5mg  IV q HD   Assessment/Plan: Cardiogenic shock: Presented to ED with VT. Cardiology consulted - s/p IV amiodarone and lidocaine drips, now changed to high dose PO amiodarone + Eliquis. Biventricular HF (EF 25-30%), severe MR and TR on RHC 6/13.  Hypotension/volume: Able to get decent amount of volume off with CRRT, now below prior EDW, still with some edema. S/p regular HD 6/15 with no UF to ease him back into it - did fine. On midodrine 10mg  TID now. Dyspnea/pulm edema: In setting of overload and #1. ESRD: CRRT 6/12- 6/14 due to the above symptoms - tolerated HD 6/15, but short run and no UF. Next HD today- will see how he does with UF.   Anemia of ERSD: Hgb quite high > 16. Looking through chart - he has significant acquired cystic  kidney disease (as of CT 2023) - likely the cause of the high Hgb. Would recommend re-imaging the kidneys, more than likely has developing RCC, although with the above issues palliative care is involved, so may or may not change the outcome for him. Secondary HPTH: Ca high, VDRA on hold. Parsabiv not on [facility] formulary. Continue binders. A-flutter/fib: On Eliquis, amio Hx CVA with residual hemiparesis COPD GOC: Appreciate palliative care involvement, family meeting 6/16- further conversation to continue based on outcome of HD today  Bufford Buttner MD 06/25/2022, 11:03 AM  [facility]

## 2022-06-25 NOTE — Progress Notes (Addendum)
Rounding Note    Patient Name: Nathaniel White Date of Encounter: 06/25/2022  [facility] Cardiologist: Verne Carrow, MD   Subjective   Patient seen in HD today. Denies palpitations, chest pain, dyspnea this morning.  Inpatient Medications    Scheduled Meds:  amiodarone  400 mg Oral BID   Followed by   Melene Muller ON 06/29/2022] amiodarone  200 mg Oral BID   apixaban  5 mg Oral BID   Chlorhexidine Gluconate Cloth  6 each Topical Daily   Chlorhexidine Gluconate Cloth  6 each Topical Q0600   Chlorhexidine Gluconate Cloth  6 each Topical Q0600   heparin sodium (porcine)       midodrine  10 mg Oral TID   mometasone-formoterol  2 puff Inhalation BID   multivitamin  1 tablet Oral QHS   senna-docusate  1 tablet Oral Daily   sodium chloride flush  3 mL Intravenous Q12H   sodium chloride flush  3 mL Intravenous Q12H   sodium chloride flush  3 mL Intravenous Q12H   sucroferric oxyhydroxide  1,000 mg Oral BID PC   Continuous Infusions:  sodium chloride     sodium chloride     PRN Meds: sodium chloride, sodium chloride, acetaminophen, albuterol, heparin, heparin sodium (porcine), hydrOXYzine, ondansetron (ZOFRAN) IV, mouth rinse, sodium chloride flush, sodium chloride flush   Vital Signs    Vitals:   06/25/22 0823 06/25/22 0825 06/25/22 0903 06/25/22 0930  BP: 110/74 116/70 112/73 97/77  Pulse: (!) 52 (!) 51 (!) 55 63  Resp: 16 15 16 20   Temp: 98.9 F (37.2 C)     TempSrc:      SpO2: 98% 97% 100% 97%  Weight:      Height:       No intake or output data in the 24 hours ending 06/25/22 0946    06/25/2022    8:16 AM 06/23/2022    8:00 AM 06/23/2022    7:50 AM  Last 3 Weights  Weight (lbs) 160 lb 4.4 oz 151 lb 10.8 oz 151 lb 10.8 oz  Weight (kg) 72.7 kg 68.8 kg 68.8 kg      Telemetry    Atrial fibrillation with frequent PVCs. Isolated brief runs of NSVT, less than 5 beats. Frequent bradycardia - Personally Reviewed  ECG    No new tracing - Personally  Reviewed  Physical Exam   GEN: No acute distress.  Chronically ill appearing Neck: JVD mid way to mandible with HOB at ~35 degrees. Cardiac: RRR, no murmurs, rubs, or gallops.  Respiratory: Clear to auscultation bilaterally. GI: Soft, nontender, non-distended  MS: trace bilateral lower extremity edema Neuro:  Nonfocal  Psych: Normal affect   Labs    High Sensitivity Troponin:   Recent Labs  Lab 06/12/22 1405 06/12/22 1527  TROPONINIHS 89* 92*     Chemistry Recent Labs  Lab 06/21/22 0651 06/21/22 1522 06/21/22 1755 06/22/22 0031 06/22/22 0032 06/23/22 0104 06/24/22 0135 06/25/22 0144  NA 134*   < > 135   < > 134* 132*  --  131*  K 5.1   < > 5.8*   < > 4.9 5.7*  --  5.8*  CL 97*  --  99   < > 97* 95*  --  88*  CO2 21*  --  21*   < > 21* 19*  --  24  GLUCOSE 72  --  67*   < > 98 72  --  91  BUN 31*  --  23*   < > 21* 36*  --  65*  CREATININE 4.69*  --  4.29*   < > 3.71* 4.77*  --  7.99*  CALCIUM 9.5  --  9.1   < > 9.7 10.6*  --  10.0  MG 2.8*  --   --    < >  --  3.0* 2.7* 2.7*  ALBUMIN 4.1  --  3.6  --  3.9  --   --   --   GFRNONAA 14*  --  15*   < > 18* 13*  --  7*  ANIONGAP 16*  --  15   < > 16* 18*  --  19*   < > = values in this interval not displayed.    Lipids No results for input(s): "CHOL", "TRIG", "HDL", "LABVLDL", "LDLCALC", "CHOLHDL" in the last 168 hours.  Hematology Recent Labs  Lab 06/21/22 0949 06/21/22 1522 06/21/22 1536 06/22/22 0031 06/25/22 0144  WBC 5.1  --   --  4.6 8.6  RBC 5.27  --   --  5.62 5.38  HGB 15.3   < > 15.6 16.8 15.5  HCT 50.3   < > 46.0 54.7* 49.7  MCV 95.4  --   --  97.3 92.4  MCH 29.0  --   --  29.9 28.8  MCHC 30.4  --   --  30.7 31.2  RDW 16.2*  --   --  16.3* 16.0*  PLT 60*  --   --  65* 101*   < > = values in this interval not displayed.   Thyroid  Recent Labs  Lab 06/19/22 2242  TSH 2.460    BNPNo results for input(s): "BNP", "PROBNP" in the last 168 hours.  DDimer No results for input(s): "DDIMER" in the  last 168 hours.   Radiology    No results found.  Cardiac Studies   06/21/22 LHC/RHC    LV end diastolic pressure is mildly elevated.   Patient is on Eliquis with A-fib history.   Nonischemic dilated cardiomyopathy with essentially normal coronary arteries.   Severely elevated right heart pressures with RV pressure 81/10, and mean pulmonary artery pressure at 46.   Elevated SVR and PVR.  06/19/22 Limited TTE  IMPRESSIONS     1. Left ventricular ejection fraction, by estimation, is 25 to 30%. The  left ventricle has severely decreased function. The left ventricle has no  regional wall motion abnormalities. Left ventricular diastolic function  could not be evaluated.   2. Right ventricular systolic function is severely reduced. The right  ventricular size is moderately enlarged.   3. Left atrial size was severely dilated.   4. Right atrial size was mildly dilated.   5. The mitral valve is abnormal. Moderate to severe mitral valve  regurgitation due to poor leaflet coaptation and a restricted posterior  leaflet. By quantitative measures, MR is severe with an EROA of 0.54cm2  and regurgitant volume of 72ml.   6. The aortic valve is normal in structure. Aortic valve regurgitation is  not visualized. No aortic stenosis is present.   7. The inferior vena cava is normal in size with greater than 50%  respiratory variability, suggesting right atrial pressure of 3 mmHg.   FINDINGS   Left Ventricle: Left ventricular ejection fraction, by estimation, is 25  to 30%. The left ventricle has severely decreased function. The left  ventricle has no regional wall motion abnormalities. The left ventricular  internal cavity size was normal in  size. There is borderline concentric left ventricular hypertrophy. Left  ventricular diastolic function could not be evaluated. Left ventricular  diastolic function could not be evaluated due to atrial fibrillation.   Right Ventricle: The right  ventricular size is moderately enlarged. No  increase in right ventricular wall thickness. Right ventricular systolic  function is severely reduced.   Left Atrium: Left atrial size was severely dilated.   Right Atrium: Right atrial size was mildly dilated.   Pericardium: There is no evidence of pericardial effusion.   Mitral Valve: The mitral valve is abnormal. Moderate to severe mitral  valve regurgitation. No evidence of mitral valve stenosis.   Tricuspid Valve: The tricuspid valve is normal in structure. Tricuspid  valve regurgitation is trivial. No evidence of tricuspid stenosis.   Aortic Valve: The aortic valve is normal in structure. Aortic valve  regurgitation is not visualized. No aortic stenosis is present.   Pulmonic Valve: The pulmonic valve was normal in structure. Pulmonic valve  regurgitation is not visualized. No evidence of pulmonic stenosis.   Aorta: The aortic root is normal in size and structure.   Venous: The inferior vena cava is normal in size with greater than 50%  respiratory variability, suggesting right atrial pressure of 3 mmHg.   IAS/Shunts: No atrial level shunt detected by color flow Doppler.   Patient Profile     62 y.o. male  with ESRD on dialysis Tuesday, Thursday, and Saturday, HFrEF with EF of 30 to 35%- nonischemic, mod- severe MR, chronic atrial fibrillation, and chronic hypertension who is being seen for the evaluation of wide-complex tachycardia.   Assessment & Plan    Wide complex tachycardia  Patient initially presented to the ED with EMS after calling for palpitations and being found with wide complex tachycardia concerning for VT. Patient had been seen in cardiology clinic on 6/11 and there was concern for wide complex tachycardia, possible VT at that time. Amiodarone 200mg  BID started. This admission, initially managed with IV amiodarone and lidocaine. LHC without CAD.   Patient now on PO Amiodarone 400mg  BID with plans to transition  to 200mg  BID after 1 week at higher dose (should now transition on 06/29/22). PVCs with isolated NSVT on overnight telemetry. Overall improvement in telemetry. Further therapy limited by significant bradycardia and low BP.  Chronic atrial fibrillation  Continue with Amiodarone as above. Continue Eliquis 5mg  BID.   Acute on chronic systolic CHF with cardiogenic shock and advanced RV/LV failure Chronic mod to severe MR   LVEF 25-30% per 06/19/22 TTE. Non-ischemic, LHC this admission stable with no evidence of CAD. CO 2.6 fick, CI 1.3.  Poor prognosis, unfortunately no options from AHF medication/therapy standpoint. Given prior non-compliance and patient needing heart-kidney transplant as only survival option. Palliative seeing patient as well. Discharge plans will need to be coordinated with palliative care and nephrology discussions. Volume management per HD  ESRD on HD  Appreciate nephrology involvement. Continues with HD. BP slightly improved today on Midodrine 10mg  TID.       For questions or updates, please contact [facility] Please consult [email or web address] for contact info under        Signed, Perlie Gold, PA-C  06/25/2022, 9:46 AM    I have personally seen and examined this patient. I agree with the assessment and plan as outlined above.  No changes from our standpoint today. Will guide discharge planning based on Nephrology recommendations. He is being seen by Palliative care also.   Verne Carrow, MD, [facility] 06/25/2022  10:34 AM

## 2022-06-25 NOTE — Progress Notes (Addendum)
Nutrition Follow-up  DOCUMENTATION CODES:   Not applicable  INTERVENTION:  Continue liberalized regular diet to encourage oral intake. Currently ordered for 1200 mL fluid restriction.  Continue double protein portions.  Provide Nepro Shake po BID, each supplement provides 425 kcal and 19 grams protein.  Continue Rena-vite po at bedtime.   Provided nutrition education regarding renal diet and fluid restriction per consult. Provided "Nutrition for Dialysis" and "The Food Label and CKD" handouts from the Academy of Nutrition and Dietetics. Encouraged adequate intake of protein at meals and discussed recommended portion sizes of protein. Discussed strategies for maximizing protein intake throughout the day. Reviewed food groups and explained why diet restrictions are needed in ESRD on HD. Pt asked questions about foods lower in potassium. Reviewed foods high in potassium to limit and reviewed alternatives that are lower in potassium. Also discussed limiting foods that are high in sodium and phosphorus. Discussed current ordered fluid restriction of 1200 mL daily. Pt reports he is followed by outpatient RD at dialysis center.  NUTRITION DIAGNOSIS:   Increased nutrient needs related to acute illness, chronic illness as evidenced by estimated needs.  Ongoing - addressing with interventions.  GOAL:   Patient will meet greater than or equal to 90% of their needs  Progressing with interventions.  MONITOR:   PO intake, Supplement acceptance, Diet advancement, Labs, Weight trends, I & O's  REASON FOR ASSESSMENT:   Consult Assessment of nutrition requirement/status, Diet education (renal diet and fluids)  ASSESSMENT:   Pt admitted d/t wide-complex tachycardia. PMH significant for ESRD on HD (TTS), HFrEF (30-35%), paroxysmal afib, and chronic HTN.  6/11: s/p echo with EF 25-30%, severe RV dysfunction, moderate to severe MR 6/12: CRRT started 6/13: cardiac cath with nonischemic dilated  cardiomyopathy with essentially normal coronary arteries, severely elevated right heart pressures 6/14: CRRT stopped 6/15: underwent iHD with no UF to ease back into iHD 6/17: s/p iHD (UF goal was 2 L, total of 1.1 L removed)  Per cardiology note pt with poor prognosis. Pt is being followed by Palliative Medicine to discuss goals of care. Family meeting was held 06/24/22. Plan is for DNAR/DNI and to see how HD treatment goes. It appears during this meeting family expressed concern for patient's diet and fluid intake. RD received consult for assessment and diet education.   Met with patient at bedside. He reports his appetite has been "okay" and that he is restricting his intake. He reports he has been working on fluid intake to not exceed ordered volume of 1200 mL daily.  He has also been purposefully eating less at meals. He reports eating about 50% of meals. Noted meal documentation varied 0-100%. He denies nausea, emesis, abdominal pain, or difficulty with chewing/swallowing. Discussed importance of adequate intake of calories and protein. Reviewed which foods contain protein and encouraged pt choose a good source of protein at meals. Pt is also amenable to drinking Nepro to help meet calorie/protein needs. He reports he enjoys these supplements and has had them in the past and tolerated well. Provided nutrition education regarding renal diet and current ordered fluid restriction per consult. Pt asked appropriate questions about fruits and vegetables that are lower in potassium. He reports he did not previously follow recommended fluid restrictions but he has been working on following ordered restriction this admission. He also reports he is supported by RD at outpatient dialysis center.   Per Nephrology note EDW is 70.5 kg. Admission wt 77 kg and pt was 78.9 kg on 6/12. With  diuresis weight got down to 68.8 kg. Current wt is 70.5 kg today after pt returned from HD.  Medications reviewed and include:  amiodarone, midodrine, Rena-vite at bedtime, senna-docusate daily, Velphoro 1000 mg BID after meals  Labs reviewed: CBG 124, Sodium 131, Potassium 5.8, Chloride 88, BUN 65, Creatinine 7.99. Last phosphorus 3.2 on 6/14  UOP: none documented in previous 24 hours  I/O: -6645.9 mL since admission  Diet Order:   Diet Order             Diet regular Room service appropriate? Yes; Fluid consistency: Thin; Fluid restriction: 1200 mL Fluid  Diet effective now                  EDUCATION NEEDS:   Education needs have been addressed  Skin:  Skin Assessment: Reviewed RN Assessment  Last BM:  6/17 - medium type 5  Height:   Ht Readings from Last 1 Encounters:  06/19/22 5\' 9"  (1.753 m)   Weight:   Wt Readings from Last 1 Encounters:  06/25/22 (S) 70.5 kg   BMI:  Body mass index is 22.95 kg/m.  Estimated Nutritional Needs:   Kcal:  2100-2300  Protein:  105-120g  Fluid:  1L + UOP  Princess Karnes Tollie Eth, MS, RD, LDN, CNSC Pager number available on Amion

## 2022-06-26 DIAGNOSIS — R531 Weakness: Secondary | ICD-10-CM

## 2022-06-26 DIAGNOSIS — I509 Heart failure, unspecified: Secondary | ICD-10-CM

## 2022-06-26 LAB — BASIC METABOLIC PANEL
Anion gap: 19 — ABNORMAL HIGH (ref 5–15)
BUN: 42 mg/dL — ABNORMAL HIGH (ref 6–20)
CO2: 21 mmol/L — ABNORMAL LOW (ref 22–32)
Calcium: 9.7 mg/dL (ref 8.9–10.3)
Chloride: 92 mmol/L — ABNORMAL LOW (ref 98–111)
Creatinine, Ser: 6.39 mg/dL — ABNORMAL HIGH (ref 0.61–1.24)
GFR, Estimated: 9 mL/min — ABNORMAL LOW (ref >60)
Glucose, Bld: 81 mg/dL (ref 70–99)
Potassium: 5.1 mmol/L (ref 3.5–5.1)
Sodium: 132 mmol/L — ABNORMAL LOW (ref 135–145)

## 2022-06-26 LAB — HEPATITIS B SURFACE ANTIBODY, QUANTITATIVE: Hep B S AB Quant (Post): 951 m[IU]/mL (ref >9.9)

## 2022-06-26 LAB — MAGNESIUM: Magnesium: 2.2 mg/dL (ref 1.7–2.4)

## 2022-06-26 LAB — LIPOPROTEIN A (LPA): Lipoprotein (a): 173.1 nmol/L — ABNORMAL HIGH (ref <75.0)

## 2022-06-26 NOTE — Care Management Important Message (Signed)
Important Message  Patient Details  Name: Nathaniel White MRN: [id number] Date of Birth: 03/03/1960   Medicare Important Message Given:  Yes     Yanique Mulvihill Stefan Church 06/26/2022, 11:46 AM

## 2022-06-26 NOTE — Plan of Care (Signed)
  Problem: Education: Goal: Knowledge of General Education information will improve Description: Including pain rating scale, medication(s)/side effects and non-pharmacologic comfort measures Outcome: Progressing   Problem: Coping: Goal: Level of anxiety will decrease Outcome: Progressing   

## 2022-06-26 NOTE — Progress Notes (Signed)
This chaplain is present with the Pt. to facilitate a notary visit for creating the Pt. Advance Directive: HCPOA and Living Will. The chaplain reviewed the Pt. AD education and the Pt. answered clarifying questions.  The chaplain is present with the Pt., notary, and witnesses for notarizing the Pt. AD.   The Pt. named Rosaura Carpenter as is healthcare agent. If this person is unable or unwilling to serve in this role the Pt. next choice is Sunday Spillers.  The chaplain gave the original AD to the Pt. along with two copies. The chaplain scanned the Pt. AD into the Pt. EMR.  This chaplain is available for F/U spiritual care as needed.  Chaplain Stephanie Acre [phone]

## 2022-06-26 NOTE — Progress Notes (Signed)
Patient ID: AMARR PLINE, male   DOB: 1960/07/24, 62 y.o.   MRN: [id number]    Progress Note from the Palliative Medicine Team at [facility]   Patient Name: Nathaniel White        Date: 06/26/2022 DOB: [birth year]  Age: 62 y.o. MRN#: [id number] Attending Physician: Maisie Fus, MD Primary Care Physician: Claiborne Rigg, NP Admit Date: 06/19/2022    Extensive chart review has been completed prior to meeting with patient/family  including labs, vital signs, imaging, progress/consult notes, orders, medications and available advance directive documents.    This NP assessed patient at the bedside as a follow up for palliative medicine needs and emotional support.  Created space and opportunity for Nathaniel White to explore thoughts and feelings regarding his current medical situation and pending medical decisions.  Nathaniel White with open and tearful at times as he processes his current life situation.  He understands the seriousness of his multiple comorbidities and the likely approaching end-of-life decisions.       Education offered today regarding  the importance of continued conversation with family and their  medical providers regarding overall plan of care and treatment options,  ensuring decisions are within the context of the patients values and GOCs.  Questions and concerns addressed   Discussed with Dr  Signe Colt    Time: 50 minutes  Detailed review of medical records ( labs, imaging, vital signs), medically appropriate exam ( MS, skin, resp)   discussed with treatment team, counseling and education to patient, family, staff, documenting clinical information, medication management, coordination of care    Lorinda Creed NP  Palliative Medicine Team Team Phone # [phone] Pager [phone]

## 2022-06-26 NOTE — Progress Notes (Addendum)
Rounding Note    Patient Name: Nathaniel White Date of Encounter: 06/26/2022  [facility] Cardiologist: Verne Carrow, MD   Subjective   Patient comfortable in bed this morning. Denies chest pain, palpitations, shortness of breath.   Inpatient Medications    Scheduled Meds:  amiodarone  400 mg Oral BID   Followed by   Melene Muller ON 06/29/2022] amiodarone  200 mg Oral BID   apixaban  5 mg Oral BID   Chlorhexidine Gluconate Cloth  6 each Topical Daily   Chlorhexidine Gluconate Cloth  6 each Topical Q0600   Chlorhexidine Gluconate Cloth  6 each Topical Q0600   feeding supplement (NEPRO CARB STEADY)  237 mL Oral BID BM   midodrine  10 mg Oral TID   mometasone-formoterol  2 puff Inhalation BID   multivitamin  1 tablet Oral QHS   senna-docusate  1 tablet Oral Daily   sodium chloride flush  3 mL Intravenous Q12H   sodium chloride flush  3 mL Intravenous Q12H   sodium chloride flush  3 mL Intravenous Q12H   sucroferric oxyhydroxide  1,000 mg Oral BID PC   Continuous Infusions:  sodium chloride     sodium chloride     PRN Meds: sodium chloride, sodium chloride, acetaminophen, albuterol, heparin, hydrOXYzine, ondansetron (ZOFRAN) IV, mouth rinse, sodium chloride flush, sodium chloride flush   Vital Signs    Vitals:   06/25/22 2300 06/26/22 0422 06/26/22 0724 06/26/22 0803  BP: 98/65 104/61 94/67   Pulse: (!) 56 60 81   Resp: 17 17 18    Temp: 98.3 F (36.8 C) 98.1 F (36.7 C) 97.7 F (36.5 C)   TempSrc: Oral Oral Oral   SpO2: (!) 89% 94% 96% 96%  Weight:      Height:        Intake/Output Summary (Last 24 hours) at 06/26/2022 0954 Last data filed at 06/25/2022 1700 Gross per 24 hour  Intake 600 ml  Output 1.1 ml  Net 598.9 ml      06/25/2022    1:08 PM 06/25/2022    8:16 AM 06/23/2022    8:00 AM  Last 3 Weights  Weight (lbs) 155 lb 6.8 oz  160 lb 4.4 oz 151 lb 10.8 oz  Weight (kg) 70.5 kg  72.7 kg 68.8 kg     Significant value      Telemetry     Atrial fibrillation, bradycardic rates - Personally Reviewed  ECG    No new tracing - Personally Reviewed  Physical Exam   GEN: No acute distress.   Neck: JVP mildly elevated, 2-3cm above clavicle with HOB at 30 degrees. Cardiac: RRR, no murmurs, rubs, or gallops.  Respiratory: bibasilar crackles GI: Soft, nontender, non-distended  MS: No edema; No deformity. Neuro:  Nonfocal  Psych: Normal affect   Labs    High Sensitivity Troponin:   Recent Labs  Lab 06/12/22 1405 06/12/22 1527  TROPONINIHS 89* 92*     Chemistry Recent Labs  Lab 06/21/22 0651 06/21/22 1522 06/21/22 1755 06/22/22 0031 06/22/22 0032 06/23/22 0104 06/24/22 0135 06/25/22 0144 06/26/22 0127 06/26/22 0356  NA 134*   < > 135   < > 134* 132*  --  131* 132*  --   K 5.1   < > 5.8*   < > 4.9 5.7*  --  5.8* 5.1  --   CL 97*  --  99   < > 97* 95*  --  88* 92*  --   CO2  21*  --  21*   < > 21* 19*  --  24 21*  --   GLUCOSE 72  --  67*   < > 98 72  --  91 81  --   BUN 31*  --  23*   < > 21* 36*  --  65* 42*  --   CREATININE 4.69*  --  4.29*   < > 3.71* 4.77*  --  7.99* 6.39*  --   CALCIUM 9.5  --  9.1   < > 9.7 10.6*  --  10.0 9.7  --   MG 2.8*  --   --    < >  --  3.0* 2.7* 2.7*  --  2.2  ALBUMIN 4.1  --  3.6  --  3.9  --   --   --   --   --   GFRNONAA 14*  --  15*   < > 18* 13*  --  7* 9*  --   ANIONGAP 16*  --  15   < > 16* 18*  --  19* 19*  --    < > = values in this interval not displayed.    Lipids No results for input(s): "CHOL", "TRIG", "HDL", "LABVLDL", "LDLCALC", "CHOLHDL" in the last 168 hours.  Hematology Recent Labs  Lab 06/21/22 0949 06/21/22 1522 06/21/22 1536 06/22/22 0031 06/25/22 0144  WBC 5.1  --   --  4.6 8.6  RBC 5.27  --   --  5.62 5.38  HGB 15.3   < > 15.6 16.8 15.5  HCT 50.3   < > 46.0 54.7* 49.7  MCV 95.4  --   --  97.3 92.4  MCH 29.0  --   --  29.9 28.8  MCHC 30.4  --   --  30.7 31.2  RDW 16.2*  --   --  16.3* 16.0*  PLT 60*  --   --  65* 101*   < > = values in  this interval not displayed.   Thyroid  Recent Labs  Lab 06/19/22 2242  TSH 2.460    BNPNo results for input(s): "BNP", "PROBNP" in the last 168 hours.  DDimer No results for input(s): "DDIMER" in the last 168 hours.   Radiology    No results found.  Cardiac Studies   06/21/22 LHC/RHC     LV end diastolic pressure is mildly elevated.   Patient is on Eliquis with A-fib history.   Nonischemic dilated cardiomyopathy with essentially normal coronary arteries.   Severely elevated right heart pressures with RV pressure 81/10, and mean pulmonary artery pressure at 46.   Elevated SVR and PVR.   06/19/22 Limited TTE   IMPRESSIONS     1. Left ventricular ejection fraction, by estimation, is 25 to 30%. The  left ventricle has severely decreased function. The left ventricle has no  regional wall motion abnormalities. Left ventricular diastolic function  could not be evaluated.   2. Right ventricular systolic function is severely reduced. The right  ventricular size is moderately enlarged.   3. Left atrial size was severely dilated.   4. Right atrial size was mildly dilated.   5. The mitral valve is abnormal. Moderate to severe mitral valve  regurgitation due to poor leaflet coaptation and a restricted posterior  leaflet. By quantitative measures, MR is severe with an EROA of 0.54cm2  and regurgitant volume of 72ml.   6. The aortic valve is normal in structure. Aortic valve regurgitation  is  not visualized. No aortic stenosis is present.   7. The inferior vena cava is normal in size with greater than 50%  respiratory variability, suggesting right atrial pressure of 3 mmHg.   FINDINGS   Left Ventricle: Left ventricular ejection fraction, by estimation, is 25  to 30%. The left ventricle has severely decreased function. The left  ventricle has no regional wall motion abnormalities. The left ventricular  internal cavity size was normal in  size. There is borderline concentric  left ventricular hypertrophy. Left  ventricular diastolic function could not be evaluated. Left ventricular  diastolic function could not be evaluated due to atrial fibrillation.   Right Ventricle: The right ventricular size is moderately enlarged. No  increase in right ventricular wall thickness. Right ventricular systolic  function is severely reduced.   Left Atrium: Left atrial size was severely dilated.   Right Atrium: Right atrial size was mildly dilated.   Pericardium: There is no evidence of pericardial effusion.   Mitral Valve: The mitral valve is abnormal. Moderate to severe mitral  valve regurgitation. No evidence of mitral valve stenosis.   Tricuspid Valve: The tricuspid valve is normal in structure. Tricuspid  valve regurgitation is trivial. No evidence of tricuspid stenosis.   Aortic Valve: The aortic valve is normal in structure. Aortic valve  regurgitation is not visualized. No aortic stenosis is present.   Pulmonic Valve: The pulmonic valve was normal in structure. Pulmonic valve  regurgitation is not visualized. No evidence of pulmonic stenosis.   Aorta: The aortic root is normal in size and structure.   Venous: The inferior vena cava is normal in size with greater than 50%  respiratory variability, suggesting right atrial pressure of 3 mmHg.   IAS/Shunts: No atrial level shunt detected by color flow Doppler.   Patient Profile     62 y.o. male with ESRD on dialysis Tuesday, Thursday, and Saturday, HFrEF with EF of 30 to 35%- nonischemic, mod- severe MR, chronic atrial fibrillation, and chronic hypertension who is being seen for the evaluation of wide-complex tachycardia.   Assessment & Plan    Wide complex tachycardia   Patient initially presented to the ED with EMS after calling for palpitations and being found with wide complex tachycardia concerning for VT. Patient had been seen in cardiology clinic on 6/11 and there was concern for wide complex  tachycardia, possible VT at that time. Amiodarone 200mg  BID started. This admission, initially managed with IV amiodarone and lidocaine. LHC without CAD.    Patient now on PO Amiodarone 400mg  BID with plans to transition to 200mg  BID after 1 week at higher dose (should now transition on 06/29/22). Continued improvement in telemetry with no NSVT overnight. Further therapy limited by significant bradycardia and low BP.   Chronic atrial fibrillation   Continue with Amiodarone as above. Continue Eliquis 5mg  BID.    Acute on chronic systolic CHF with cardiogenic shock and advanced RV/LV failure Chronic mod to severe MR    LVEF 25-30% per 06/19/22 TTE. Non-ischemic, LHC this admission stable with no evidence of CAD. CO 2.6 fick, CI 1.3.   Poor prognosis, unfortunately no options from AHF medication/therapy standpoint. Given prior non-compliance and patient needing heart-kidney transplant as only survival option. Palliative seeing patient as well. Discharge plans will need to be coordinated with palliative care and nephrology discussions. Volume management per HD   ESRD on HD   Appreciate nephrology involvement. Continues with HD. BP slightly improved on Midodrine 10mg  TID. Per nephrology, plans for  additional inpatient diuresis tomorrow 6/19 before patient could be discharged.        For questions or updates, please contact [facility] Please consult [email or web address] for contact info under        Signed, Perlie Gold, PA-C  06/26/2022, 9:54 AM    I have personally seen and examined this patient. I agree with the assessment and plan as outlined above.  No changes in his cardiac status today. Plan per Nephrology to perform HD tomorrow and see how he tolerates before any further decisions about his disposition.   Verne Carrow, MD, [facility] 06/26/2022 10:44 AM

## 2022-06-26 NOTE — Progress Notes (Signed)
[facility] Progress Note   Subjective:  Seen in room - 1.1L off in HD yesterday.  He's pretty firm- intends to keep going with dialysis as long as possible.    Objective Vitals:   06/26/22 0803 06/26/22 1000 06/26/22 1043 06/26/22 1118  BP:  117/71  100/72  Pulse:  60 60 69  Resp:  20 19 16   Temp:    98.4 F (36.9 C)  TempSrc:    Oral  SpO2: 96% 96% 95% 96%  Weight:      Height:       Physical Exam General: Chronically ill appearing man, NAD. Room air Heart: Irregularly irregular Lungs: CTA anteriorly, dull B bases Abdomen: soft, non-tender Extremities: 1+ BLE edema Dialysis Access: [facility]  Additional Objective Labs: Basic Metabolic Panel: Recent Labs  Lab 06/21/22 0651 06/21/22 1522 06/21/22 1755 06/22/22 0031 06/22/22 0032 06/23/22 0104 06/25/22 0144 06/26/22 0127  NA 134*   < > 135   < > 134* 132* 131* 132*  K 5.1   < > 5.8*   < > 4.9 5.7* 5.8* 5.1  CL 97*  --  99   < > 97* 95* 88* 92*  CO2 21*  --  21*   < > 21* 19* 24 21*  GLUCOSE 72  --  67*   < > 98 72 91 81  BUN 31*  --  23*   < > 21* 36* 65* 42*  CREATININE 4.69*  --  4.29*   < > 3.71* 4.77* 7.99* 6.39*  CALCIUM 9.5  --  9.1   < > 9.7 10.6* 10.0 9.7  PHOS 3.2  --  3.7  --  3.2  --   --   --    < > = values in this interval not displayed.   Liver Function Tests: Recent Labs  Lab 06/21/22 0651 06/21/22 1755 06/22/22 0032  ALBUMIN 4.1 3.6 3.9   CBC: Recent Labs  Lab 06/19/22 2242 06/21/22 0949 06/21/22 1522 06/21/22 1536 06/22/22 0031 06/25/22 0144  WBC 5.5 5.1  --   --  4.6 8.6  NEUTROABS 3.9  --   --   --   --   --   HGB 12.9* 15.3   < > 15.6 16.8 15.5  HCT 42.1 50.3   < > 46.0 54.7* 49.7  MCV 96.8 95.4  --   --  97.3 92.4  PLT 64* 60*  --   --  65* 101*   < > = values in this interval not displayed.   Medications:  sodium chloride     sodium chloride      amiodarone  400 mg Oral BID   Followed by   Melene Muller ON 06/29/2022] amiodarone  200 mg Oral BID   apixaban  5  mg Oral BID   Chlorhexidine Gluconate Cloth  6 each Topical Daily   Chlorhexidine Gluconate Cloth  6 each Topical Q0600   Chlorhexidine Gluconate Cloth  6 each Topical Q0600   feeding supplement (NEPRO CARB STEADY)  237 mL Oral BID BM   midodrine  10 mg Oral TID   mometasone-formoterol  2 puff Inhalation BID   multivitamin  1 tablet Oral QHS   senna-docusate  1 tablet Oral Daily   sodium chloride flush  3 mL Intravenous Q12H   sodium chloride flush  3 mL Intravenous Q12H   sodium chloride flush  3 mL Intravenous Q12H   sucroferric oxyhydroxide  1,000 mg Oral BID PC  Dialysis Orders: TTS at AF 4:15hr, 450/500, EDW 70.5kg, 2K/2Ca, TDC, no heparin - Last HD 6/11, unable tp reach EDW for > 2 weeks, last post-wt 77.2kg - Hectoral IV q HD - Parsabiv 7.5mg  IV q HD   Assessment/Plan: Cardiogenic shock: Presented to ED with VT. Cardiology consulted - s/p IV amiodarone and lidocaine drips, now changed to high dose PO amiodarone + Eliquis. Biventricular HF (EF 25-30%), severe MR and TR on RHC 6/13.  Hypotension/volume: Able to get decent amount of volume off with CRRT, now below prior EDW, still with some edema. S/p regular HD 6/15 with no UF to ease him back into it - did fine. On midodrine 10mg  TID now. Dyspnea/pulm edema: In setting of overload and #1. ESRD: CRRT 6/12- 6/14 due to the above symptoms - tolerated HD 6/15, but short run and no UF. HD 6/17 with 1.1L UF.  Another short rx tomorrow 6/19 and then can resume TTS schedule Anemia of ERSD: Hgb quite high > 16. Looking through chart - he has significant acquired cystic kidney disease (as of CT 2023) - likely the cause of the high Hgb. Would recommend re-imaging the kidneys, more than likely has developing RCC, although with the above issues palliative care is involved, so may or may not change the outcome for him. Secondary HPTH: Ca high, VDRA on hold. Parsabiv not on [facility] formulary. Continue binders. A-flutter/fib: On Eliquis,  amio Hx CVA with residual hemiparesis COPD GOC: Appreciate palliative care involvement, family meeting 6/16- further conversation to continue based on outcome of HD today  Bufford Buttner MD 06/26/2022, 11:56 AM  [facility]

## 2022-06-27 LAB — RENAL FUNCTION PANEL
Albumin: 3 g/dL — ABNORMAL LOW (ref 3.5–5.0)
Anion gap: 18 — ABNORMAL HIGH (ref 5–15)
BUN: 87 mg/dL — ABNORMAL HIGH (ref 6–20)
CO2: 22 mmol/L (ref 22–32)
Calcium: 9.4 mg/dL (ref 8.9–10.3)
Chloride: 94 mmol/L — ABNORMAL LOW (ref 98–111)
Creatinine, Ser: 8.83 mg/dL — ABNORMAL HIGH (ref 0.61–1.24)
GFR, Estimated: 6 mL/min — ABNORMAL LOW (ref >60)
Glucose, Bld: 81 mg/dL (ref 70–99)
Phosphorus: 7.7 mg/dL — ABNORMAL HIGH (ref 2.5–4.6)
Potassium: 5.4 mmol/L — ABNORMAL HIGH (ref 3.5–5.1)
Sodium: 134 mmol/L — ABNORMAL LOW (ref 135–145)

## 2022-06-27 LAB — CBC
HCT: 46.6 % (ref 39.0–52.0)
Hemoglobin: 14.8 g/dL (ref 13.0–17.0)
MCH: 29.5 pg (ref 26.0–34.0)
MCHC: 31.8 g/dL (ref 30.0–36.0)
MCV: 92.8 fL (ref 80.0–100.0)
Platelets: 118 10*3/uL — ABNORMAL LOW (ref 150–400)
RBC: 5.02 MIL/uL (ref 4.22–5.81)
RDW: 15.9 % — ABNORMAL HIGH (ref 11.5–15.5)
WBC: 6.7 10*3/uL (ref 4.0–10.5)
nRBC: 0 % (ref 0.0–0.2)

## 2022-06-27 LAB — MAGNESIUM: Magnesium: 2.5 mg/dL — ABNORMAL HIGH (ref 1.7–2.4)

## 2022-06-27 MED ORDER — HEPARIN SODIUM (PORCINE) 1000 UNIT/ML IJ SOLN
INTRAMUSCULAR | Status: AC
  Filled 2022-06-27: qty 5

## 2022-06-27 MED ORDER — MIDODRINE HCL 5 MG PO TABS
10.0000 mg | ORAL_TABLET | Freq: Once | ORAL | Status: AC
  Administered 2022-06-27: 10 mg via ORAL

## 2022-06-27 NOTE — Procedures (Signed)
Patient seen and examined on Hemodialysis. The procedure was supervised and I have made appropriate changes. BP 95/66   Pulse (!) 33   Temp (!) 97.5 F (36.4 C) (Oral)   Resp 18   Ht 5\' 9"  (1.753 m)   Wt 71.7 kg   SpO2 91%   BMI 23.34 kg/m   QB 400 mL/ min via TDC, UF goal 2L  Tolerating treatment without complaints at this time.  Pressures were a little soft- gave mid-HD dose of midodrine.     Bufford Buttner MD [facility] [phone] 11:15 AM

## 2022-06-27 NOTE — Plan of Care (Signed)
Plan of care reviewed. Pt has been progressing. Stable hemodynamically, afebrile, no acute distress noted tonight. On 3 LPM of O2 NCL, SPO2 95-100%,  Atrial flutter with BBB on the monitor, HR 45-50s, BP 94/56- 122/57 mmHg.  Continue closer monitoring EKG and BP.   Problem: Education: Goal: Knowledge of General Education information will improve Description: Including pain rating scale, medication(s)/side effects and non-pharmacologic comfort measures Outcome: Progressing  Problem: Health Behavior/Discharge Planning: Goal: Ability to manage health-related needs will improve Outcome: Progressing   Problem: Clinical Measurements: Goal: Respiratory complications will improve Problem: Clinical Measurements: No respiratory distress noted.   Problem: Cardiovascular: Goal: Ability to achieve and maintain adequate cardiovascular perfusion will improve Outcome: Progressing Goal: Vascular access site(s) Level 0-1 will be maintained Outcome: Progressing: right groin is level 0.  Problem: Clinical Measurements: Goal: Cardiovascular complication will be avoided Outcome: Progressing:   Problem: Nutrition: Goal: Adequate nutrition will be maintained Outcome: Progressing: good appetite. Denied nausea/ vomiting.  Problem: Elimination: Goal: Will not experience complications related to bowel motility Outcome: Progressing, LBM 06/26/22 Goal: Will not experience complications related to urinary retention Outcome: Progressing: anuric, HD on Mon, Wed and Fri.    Filiberto Pinks, RN

## 2022-06-27 NOTE — Progress Notes (Signed)
[facility] Progress Note   Subjective:  Seen on dialysis- pressures are a little soft, got pre-HD midodrine and gave an extra dose during HD.  Tolerating rx well for now.   Objective Vitals:   06/27/22 0930 06/27/22 1000 06/27/22 1030 06/27/22 1100  BP: 102/71 100/67 93/75 95/66   Pulse: (!) 30 (!) 48  (!) 33  Resp: 16 (!) 31 (!) 37 18  Temp:      TempSrc:      SpO2: 99% 99%  91%  Weight:      Height:       Physical Exam General: Chronically ill appearing man, NAD. Room air Heart: Irregularly irregular Lungs: CTA anteriorly, dull B bases Abdomen: soft, non-tender Extremities: 1+ BLE edema Dialysis Access: [facility]  Additional Objective Labs: Basic Metabolic Panel: Recent Labs  Lab 06/21/22 1755 06/22/22 0031 06/22/22 0032 06/23/22 0104 06/25/22 0144 06/26/22 0127 06/27/22 0820  NA 135   < > 134*   < > 131* 132* 134*  K 5.8*   < > 4.9   < > 5.8* 5.1 5.4*  CL 99   < > 97*   < > 88* 92* 94*  CO2 21*   < > 21*   < > 24 21* 22  GLUCOSE 67*   < > 98   < > 91 81 81  BUN 23*   < > 21*   < > 65* 42* 87*  CREATININE 4.29*   < > 3.71*   < > 7.99* 6.39* 8.83*  CALCIUM 9.1   < > 9.7   < > 10.0 9.7 9.4  PHOS 3.7  --  3.2  --   --   --  7.7*   < > = values in this interval not displayed.   Liver Function Tests: Recent Labs  Lab 06/21/22 1755 06/22/22 0032 06/27/22 0820  ALBUMIN 3.6 3.9 3.0*   CBC: Recent Labs  Lab 06/21/22 0949 06/21/22 1522 06/22/22 0031 06/25/22 0144 06/27/22 0820  WBC 5.1  --  4.6 8.6 6.7  HGB 15.3   < > 16.8 15.5 14.8  HCT 50.3   < > 54.7* 49.7 46.6  MCV 95.4  --  97.3 92.4 92.8  PLT 60*  --  65* 101* 118*   < > = values in this interval not displayed.   Medications:  sodium chloride     sodium chloride      amiodarone  400 mg Oral BID   Followed by   Melene Muller ON 06/29/2022] amiodarone  200 mg Oral BID   apixaban  5 mg Oral BID   Chlorhexidine Gluconate Cloth  6 each Topical Daily   Chlorhexidine Gluconate Cloth  6 each  Topical Q0600   Chlorhexidine Gluconate Cloth  6 each Topical Q0600   feeding supplement (NEPRO CARB STEADY)  237 mL Oral BID BM   midodrine  10 mg Oral TID   mometasone-formoterol  2 puff Inhalation BID   multivitamin  1 tablet Oral QHS   senna-docusate  1 tablet Oral Daily   sodium chloride flush  3 mL Intravenous Q12H   sodium chloride flush  3 mL Intravenous Q12H   sodium chloride flush  3 mL Intravenous Q12H   sucroferric oxyhydroxide  1,000 mg Oral BID PC    Dialysis Orders: TTS at AF 4:15hr, 450/500, EDW 70.5kg, 2K/2Ca, TDC, no heparin - Last HD 6/11, unable tp reach EDW for > 2 weeks, last post-wt 77.2kg - Hectoral IV q HD - [employer]  7.5mg  IV q HD   Assessment/Plan: Cardiogenic shock: Presented to ED with VT. Cardiology consulted - s/p IV amiodarone and lidocaine drips, now changed to high dose PO amiodarone + Eliquis. Biventricular HF (EF 25-30%), severe MR and TR on RHC 6/13.  Hypotension/volume: Able to get decent amount of volume off with CRRT, now below prior EDW, still with some edema. S/p regular HD 6/15 with no UF to ease him back into it - did fine. On midodrine 10mg  TID now. Dyspnea/pulm edema: In setting of overload and #1. ESRD: CRRT 6/12- 6/14 due to the above symptoms - tolerated HD 6/15, but short run and no UF. HD 6/17 with 1.1L UF.  Another short rx 6/19 and then can resume TTS schedule Anemia of ERSD: Hgb quite high > 16. Looking through chart - he has significant acquired cystic kidney disease (as of CT 2023) - likely the cause of the high Hgb. Would recommend re-imaging the kidneys, more than likely has developing RCC, although with the above issues palliative care is involved, so may or may not change the outcome for him. Secondary HPTH: Ca high, VDRA on hold. Parsabiv not on [facility] formulary. Continue binders. A-flutter/fib: On Eliquis, amio Hx CVA with residual hemiparesis COPD GOC: Appreciate palliative care involvement, continue HD as long as he can.    Dispo: If he tolerates HD well today would say he is approaching discharge  Bufford Buttner MD 06/27/2022, 11:11 AM  [facility]

## 2022-06-27 NOTE — Progress Notes (Signed)
Rounding Note    Patient Name: Nathaniel White Date of Encounter: 06/27/2022  [facility] Cardiologist: Verne Carrow, MD   Subjective   No chest pain or dyspnea  Inpatient Medications    Scheduled Meds:  amiodarone  400 mg Oral BID   Followed by   Melene Muller ON 06/29/2022] amiodarone  200 mg Oral BID   apixaban  5 mg Oral BID   Chlorhexidine Gluconate Cloth  6 each Topical Daily   Chlorhexidine Gluconate Cloth  6 each Topical Q0600   Chlorhexidine Gluconate Cloth  6 each Topical Q0600   feeding supplement (NEPRO CARB STEADY)  237 mL Oral BID BM   midodrine  10 mg Oral TID   mometasone-formoterol  2 puff Inhalation BID   multivitamin  1 tablet Oral QHS   senna-docusate  1 tablet Oral Daily   sodium chloride flush  3 mL Intravenous Q12H   sodium chloride flush  3 mL Intravenous Q12H   sodium chloride flush  3 mL Intravenous Q12H   sucroferric oxyhydroxide  1,000 mg Oral BID PC   Continuous Infusions:  sodium chloride     sodium chloride     PRN Meds: sodium chloride, sodium chloride, acetaminophen, albuterol, heparin, hydrOXYzine, ondansetron (ZOFRAN) IV, mouth rinse, sodium chloride flush, sodium chloride flush   Vital Signs    Vitals:   06/27/22 0830 06/27/22 0900 06/27/22 0930 06/27/22 1000  BP: 101/60 112/68 102/71 100/67  Pulse: (!) 40 (!) 57 (!) 30 (!) 48  Resp: 19 (!) 22 16 (!) 31  Temp:      TempSrc:      SpO2: 98%  99% 99%  Weight:      Height:        Intake/Output Summary (Last 24 hours) at 06/27/2022 1003 Last data filed at 06/26/2022 2300 Gross per 24 hour  Intake 740 ml  Output --  Net 740 ml      06/27/2022    3:00 AM 06/25/2022    1:08 PM 06/25/2022    8:16 AM  Last 3 Weights  Weight (lbs) 158 lb 1.1 oz 155 lb 6.8 oz  160 lb 4.4 oz  Weight (kg) 71.7 kg 70.5 kg  72.7 kg     Significant value      Telemetry    Atrial fib - Personally Reviewed  ECG    No new tracing - Personally Reviewed  Physical Exam   General:  Well developed, well nourished, NAD  HEENT: OP clear, mucus membranes moist  Musculoskeletal: Muscle strength 5/5 all ext  Psychiatric: Mood and affect normal  Neck: No JVD Lungs:Clear bilaterally, no wheezes, rhonci, crackles Cardiovascular: Irregular. No murmurs, gallops or rubs. Abdomen:Soft. Bowel sounds present. Non-tender.  Extremities: No lower extremity edema.   Labs    High Sensitivity Troponin:   Recent Labs  Lab 06/12/22 1405 06/12/22 1527  TROPONINIHS 89* 92*     Chemistry Recent Labs  Lab 06/21/22 1755 06/22/22 0031 06/22/22 0032 06/23/22 0104 06/25/22 0144 06/26/22 0127 06/26/22 0356 06/27/22 0104 06/27/22 0820  NA 135   < > 134*   < > 131* 132*  --   --  134*  K 5.8*   < > 4.9   < > 5.8* 5.1  --   --  5.4*  CL 99   < > 97*   < > 88* 92*  --   --  94*  CO2 21*   < > 21*   < > 24 21*  --   --  22  GLUCOSE 67*   < > 98   < > 91 81  --   --  81  BUN 23*   < > 21*   < > 65* 42*  --   --  87*  CREATININE 4.29*   < > 3.71*   < > 7.99* 6.39*  --   --  8.83*  CALCIUM 9.1   < > 9.7   < > 10.0 9.7  --   --  9.4  MG  --    < >  --    < > 2.7*  --  2.2 2.5*  --   ALBUMIN 3.6  --  3.9  --   --   --   --   --  3.0*  GFRNONAA 15*   < > 18*   < > 7* 9*  --   --  6*  ANIONGAP 15   < > 16*   < > 19* 19*  --   --  18*   < > = values in this interval not displayed.    Lipids No results for input(s): "CHOL", "TRIG", "HDL", "LABVLDL", "LDLCALC", "CHOLHDL" in the last 168 hours.  Hematology Recent Labs  Lab 06/22/22 0031 06/25/22 0144 06/27/22 0820  WBC 4.6 8.6 6.7  RBC 5.62 5.38 5.02  HGB 16.8 15.5 14.8  HCT 54.7* 49.7 46.6  MCV 97.3 92.4 92.8  MCH 29.9 28.8 29.5  MCHC 30.7 31.2 31.8  RDW 16.3* 16.0* 15.9*  PLT 65* 101* 118*   Thyroid  No results for input(s): "TSH", "FREET4" in the last 168 hours.   BNPNo results for input(s): "BNP", "PROBNP" in the last 168 hours.  DDimer No results for input(s): "DDIMER" in the last 168 hours.   Radiology    No results  found.  Cardiac Studies   06/21/22 LHC/RHC     LV end diastolic pressure is mildly elevated.   Patient is on Eliquis with A-fib history.   Nonischemic dilated cardiomyopathy with essentially normal coronary arteries.   Severely elevated right heart pressures with RV pressure 81/10, and mean pulmonary artery pressure at 46.   Elevated SVR and PVR.   06/19/22 Limited TTE   IMPRESSIONS     1. Left ventricular ejection fraction, by estimation, is 25 to 30%. The  left ventricle has severely decreased function. The left ventricle has no  regional wall motion abnormalities. Left ventricular diastolic function  could not be evaluated.   2. Right ventricular systolic function is severely reduced. The right  ventricular size is moderately enlarged.   3. Left atrial size was severely dilated.   4. Right atrial size was mildly dilated.   5. The mitral valve is abnormal. Moderate to severe mitral valve  regurgitation due to poor leaflet coaptation and a restricted posterior  leaflet. By quantitative measures, MR is severe with an EROA of 0.54cm2  and regurgitant volume of 72ml.   6. The aortic valve is normal in structure. Aortic valve regurgitation is  not visualized. No aortic stenosis is present.   7. The inferior vena cava is normal in size with greater than 50%  respiratory variability, suggesting right atrial pressure of 3 mmHg.   FINDINGS   Left Ventricle: Left ventricular ejection fraction, by estimation, is 25  to 30%. The left ventricle has severely decreased function. The left  ventricle has no regional wall motion abnormalities. The left ventricular  internal cavity size was normal in  size. There is borderline concentric  left ventricular hypertrophy. Left  ventricular diastolic function could not be evaluated. Left ventricular  diastolic function could not be evaluated due to atrial fibrillation.   Right Ventricle: The right ventricular size is moderately enlarged. No   increase in right ventricular wall thickness. Right ventricular systolic  function is severely reduced.   Left Atrium: Left atrial size was severely dilated.   Right Atrium: Right atrial size was mildly dilated.   Pericardium: There is no evidence of pericardial effusion.   Mitral Valve: The mitral valve is abnormal. Moderate to severe mitral  valve regurgitation. No evidence of mitral valve stenosis.   Tricuspid Valve: The tricuspid valve is normal in structure. Tricuspid  valve regurgitation is trivial. No evidence of tricuspid stenosis.   Aortic Valve: The aortic valve is normal in structure. Aortic valve  regurgitation is not visualized. No aortic stenosis is present.   Pulmonic Valve: The pulmonic valve was normal in structure. Pulmonic valve  regurgitation is not visualized. No evidence of pulmonic stenosis.   Aorta: The aortic root is normal in size and structure.   Venous: The inferior vena cava is normal in size with greater than 50%  respiratory variability, suggesting right atrial pressure of 3 mmHg.   IAS/Shunts: No atrial level shunt detected by color flow Doppler.   Patient Profile     62 y.o. male with ESRD on dialysis Tuesday, Thursday, and Saturday, HFrEF with EF of 30 to 35%- nonischemic, mod- severe MR, chronic atrial fibrillation, and chronic hypertension who is being seen for the evaluation of wide-complex tachycardia.   Assessment & Plan    Wide complex tachycardia   Patient initially presented to the ED with EMS after calling for palpitations and being found with wide complex tachycardia concerning for VT. Patient had been seen in cardiology clinic on 6/11 and there was concern for wide complex tachycardia, possible VT at that time. Amiodarone 200mg  BID started. This admission, initially managed with IV amiodarone and lidocaine. LHC without CAD.    Patient now on PO Amiodarone 400mg  BID with plans to transition to 200mg  BID after 1 week at higher dose.  Will plan to transition to 200 mg po BID on 06/29/22. Further therapy limited by significant bradycardia and low BP.   Chronic atrial fibrillation: Continue amiodarone and Eliquis.    Acute on chronic systolic CHF with cardiogenic shock and advanced RV/LV failure Chronic mod to severe MR    LVEF 25-30% per 06/19/22 TTE. Non-ischemic, LHC this admission stable with no evidence of CAD. CO 2.6 fick, CI 1.3.   Poor prognosis, unfortunately no options from AHF medication/therapy standpoint. Given prior non-compliance and patient needing heart-kidney transplant as only survival option. We will base discharge planning on recs of the Nephrology and Palliative care teams.  Volume management per HD   ESRD on HD  For questions or updates, please contact [facility] Please consult [email or web address] for contact info under   Signed, Verne Carrow, MD  06/27/2022, 10:03 AM

## 2022-06-28 ENCOUNTER — Other Ambulatory Visit (HOSPITAL_COMMUNITY): Payer: Self-pay

## 2022-06-28 LAB — RENAL FUNCTION PANEL
Albumin: 3.2 g/dL — ABNORMAL LOW (ref 3.5–5.0)
Anion gap: 18 — ABNORMAL HIGH (ref 5–15)
BUN: 75 mg/dL — ABNORMAL HIGH (ref 6–20)
CO2: 23 mmol/L (ref 22–32)
Calcium: 9.3 mg/dL (ref 8.9–10.3)
Chloride: 92 mmol/L — ABNORMAL LOW (ref 98–111)
Creatinine, Ser: 8.67 mg/dL — ABNORMAL HIGH (ref 0.61–1.24)
GFR, Estimated: 6 mL/min — ABNORMAL LOW (ref >60)
Glucose, Bld: 89 mg/dL (ref 70–99)
Phosphorus: 8.1 mg/dL — ABNORMAL HIGH (ref 2.5–4.6)
Potassium: 5.6 mmol/L — ABNORMAL HIGH (ref 3.5–5.1)
Sodium: 133 mmol/L — ABNORMAL LOW (ref 135–145)

## 2022-06-28 LAB — CBC
HCT: 48.9 % (ref 39.0–52.0)
Hemoglobin: 15.2 g/dL (ref 13.0–17.0)
MCH: 28.8 pg (ref 26.0–34.0)
MCHC: 31.1 g/dL (ref 30.0–36.0)
MCV: 92.6 fL (ref 80.0–100.0)
Platelets: 119 10*3/uL — ABNORMAL LOW (ref 150–400)
RBC: 5.28 MIL/uL (ref 4.22–5.81)
RDW: 16.3 % — ABNORMAL HIGH (ref 11.5–15.5)
WBC: 6.6 10*3/uL (ref 4.0–10.5)
nRBC: 0 % (ref 0.0–0.2)

## 2022-06-28 LAB — MAGNESIUM: Magnesium: 2.3 mg/dL (ref 1.7–2.4)

## 2022-06-28 MED ORDER — AMIODARONE HCL 200 MG PO TABS: ORAL_TABLET | ORAL | 1 refills | Status: DC

## 2022-06-28 MED ORDER — AMIODARONE HCL 200 MG PO TABS
ORAL_TABLET | ORAL | 1 refills | Status: DC
  Filled 2022-06-28 (×2): qty 104, 97d supply, fill #0

## 2022-06-28 NOTE — Progress Notes (Addendum)
Rounding Note    Patient Name: Nathaniel White Date of Encounter: 06/28/2022  [facility] Cardiologist: Verne Carrow, MD   Subjective   Seen in HD, feels ok this morning.   Inpatient Medications    Scheduled Meds:  amiodarone  400 mg Oral BID   Followed by   Melene Muller ON 06/29/2022] amiodarone  200 mg Oral BID   apixaban  5 mg Oral BID   Chlorhexidine Gluconate Cloth  6 each Topical Daily   Chlorhexidine Gluconate Cloth  6 each Topical Q0600   Chlorhexidine Gluconate Cloth  6 each Topical Q0600   feeding supplement (NEPRO CARB STEADY)  237 mL Oral BID BM   midodrine  10 mg Oral TID   mometasone-formoterol  2 puff Inhalation BID   multivitamin  1 tablet Oral QHS   senna-docusate  1 tablet Oral Daily   sodium chloride flush  3 mL Intravenous Q12H   sodium chloride flush  3 mL Intravenous Q12H   sodium chloride flush  3 mL Intravenous Q12H   sucroferric oxyhydroxide  1,000 mg Oral BID PC   Continuous Infusions:  sodium chloride     sodium chloride     PRN Meds: sodium chloride, sodium chloride, acetaminophen, albuterol, heparin, hydrOXYzine, ondansetron (ZOFRAN) IV, mouth rinse, sodium chloride flush, sodium chloride flush   Vital Signs    Vitals:   06/28/22 0800 06/28/22 0830 06/28/22 0844 06/28/22 0900  BP: 111/76 (S) 90/65 (S) (!) 89/68 108/69  Pulse: (!) 56 62 62 67  Resp: (!) 28 (!) 22 19 19   Temp:      TempSrc:      SpO2: 94% 98% 98% 92%  Weight:      Height:        Intake/Output Summary (Last 24 hours) at 06/28/2022 0904 Last data filed at 06/27/2022 2100 Gross per 24 hour  Intake 250 ml  Output 1500 ml  Net -1250 ml      06/28/2022    7:42 AM 06/27/2022   11:42 AM 06/27/2022    3:00 AM  Last 3 Weights  Weight (lbs) 158 lb 4.6 oz 157 lb 6.5 oz 158 lb 1.1 oz  Weight (kg) 71.8 kg 71.4 kg 71.7 kg      Telemetry    Atrial fibrillation - Personally Reviewed  ECG    No new tracing  Physical Exam   GEN: No acute distress.    Neck: No JVD Cardiac: RRR, no murmurs, rubs, or gallops.  Respiratory: Clear to auscultation bilaterally. GI: Soft, nontender, non-distended  MS: No edema; No deformity. Neuro:  Nonfocal  Psych: Normal affect   Labs    High Sensitivity Troponin:   Recent Labs  Lab 06/12/22 1405 06/12/22 1527  TROPONINIHS 89* 92*     Chemistry Recent Labs  Lab 06/21/22 1755 06/22/22 0031 06/22/22 0032 06/23/22 0104 06/25/22 0144 06/26/22 0127 06/26/22 0356 06/27/22 0104 06/27/22 0820 06/28/22 0129  NA 135   < > 134*   < > 131* 132*  --   --  134*  --   K 5.8*   < > 4.9   < > 5.8* 5.1  --   --  5.4*  --   CL 99   < > 97*   < > 88* 92*  --   --  94*  --   CO2 21*   < > 21*   < > 24 21*  --   --  22  --   GLUCOSE 67*   < >  98   < > 91 81  --   --  81  --   BUN 23*   < > 21*   < > 65* 42*  --   --  87*  --   CREATININE 4.29*   < > 3.71*   < > 7.99* 6.39*  --   --  8.83*  --   CALCIUM 9.1   < > 9.7   < > 10.0 9.7  --   --  9.4  --   MG  --    < >  --    < > 2.7*  --  2.2 2.5*  --  2.3  ALBUMIN 3.6  --  3.9  --   --   --   --   --  3.0*  --   GFRNONAA 15*   < > 18*   < > 7* 9*  --   --  6*  --   ANIONGAP 15   < > 16*   < > 19* 19*  --   --  18*  --    < > = values in this interval not displayed.    Lipids No results for input(s): "CHOL", "TRIG", "HDL", "LABVLDL", "LDLCALC", "CHOLHDL" in the last 168 hours.  Hematology Recent Labs  Lab 06/22/22 0031 06/25/22 0144 06/27/22 0820  WBC 4.6 8.6 6.7  RBC 5.62 5.38 5.02  HGB 16.8 15.5 14.8  HCT 54.7* 49.7 46.6  MCV 97.3 92.4 92.8  MCH 29.9 28.8 29.5  MCHC 30.7 31.2 31.8  RDW 16.3* 16.0* 15.9*  PLT 65* 101* 118*   Thyroid No results for input(s): "TSH", "FREET4" in the last 168 hours.  BNPNo results for input(s): "BNP", "PROBNP" in the last 168 hours.  DDimer No results for input(s): "DDIMER" in the last 168 hours.   Radiology    No results found.  Cardiac Studies   06/21/22 LHC/RHC     LV end diastolic pressure is mildly  elevated.   Patient is on Eliquis with A-fib history.   Nonischemic dilated cardiomyopathy with essentially normal coronary arteries.   Severely elevated right heart pressures with RV pressure 81/10, and mean pulmonary artery pressure at 46.   Elevated SVR and PVR.   06/19/22 Limited TTE   IMPRESSIONS     1. Left ventricular ejection fraction, by estimation, is 25 to 30%. The  left ventricle has severely decreased function. The left ventricle has no  regional wall motion abnormalities. Left ventricular diastolic function  could not be evaluated.   2. Right ventricular systolic function is severely reduced. The right  ventricular size is moderately enlarged.   3. Left atrial size was severely dilated.   4. Right atrial size was mildly dilated.   5. The mitral valve is abnormal. Moderate to severe mitral valve  regurgitation due to poor leaflet coaptation and a restricted posterior  leaflet. By quantitative measures, MR is severe with an EROA of 0.54cm2  and regurgitant volume of 72ml.   6. The aortic valve is normal in structure. Aortic valve regurgitation is  not visualized. No aortic stenosis is present.   7. The inferior vena cava is normal in size with greater than 50%  respiratory variability, suggesting right atrial pressure of 3 mmHg.   FINDINGS   Left Ventricle: Left ventricular ejection fraction, by estimation, is 25  to 30%. The left ventricle has severely decreased function. The left  ventricle has no regional wall motion abnormalities. The left ventricular  internal cavity size was normal in  size. There is borderline concentric left ventricular hypertrophy. Left  ventricular diastolic function could not be evaluated. Left ventricular  diastolic function could not be evaluated due to atrial fibrillation.   Right Ventricle: The right ventricular size is moderately enlarged. No  increase in right ventricular wall thickness. Right ventricular systolic  function is  severely reduced.   Left Atrium: Left atrial size was severely dilated.   Right Atrium: Right atrial size was mildly dilated.   Pericardium: There is no evidence of pericardial effusion.   Mitral Valve: The mitral valve is abnormal. Moderate to severe mitral  valve regurgitation. No evidence of mitral valve stenosis.   Tricuspid Valve: The tricuspid valve is normal in structure. Tricuspid  valve regurgitation is trivial. No evidence of tricuspid stenosis.   Aortic Valve: The aortic valve is normal in structure. Aortic valve  regurgitation is not visualized. No aortic stenosis is present.   Pulmonic Valve: The pulmonic valve was normal in structure. Pulmonic valve  regurgitation is not visualized. No evidence of pulmonic stenosis.   Aorta: The aortic root is normal in size and structure.   Venous: The inferior vena cava is normal in size with greater than 50%  respiratory variability, suggesting right atrial pressure of 3 mmHg  Patient Profile     62 y.o. male with ESRD on dialysis Tuesday, Thursday, and Saturday, HFrEF with EF of 30 to 35%- nonischemic, mod- severe MR, chronic atrial fibrillation, and chronic hypertension who is being seen for the evaluation of wide-complex tachycardia.   Assessment & Plan    Wide complex tachycardia -- Patient initially presented to the ED with EMS after calling for palpitations and being found with wide complex tachycardia concerning for VT. Patient had been seen in cardiology clinic on 6/11 and there was concern for wide complex tachycardia, possible VT at that time.  -- on Amiodarone 400mg  BID with plans to transition to 200mg  BID tomorrow -- additional therapy has been limited in the setting of hypotension and bradycardia   Chronic atrial fibrillation -- remains rate controlled -- Continue amiodarone and Eliquis.    Acute on chronic systolic CHF with cardiogenic shock and advanced RV/LV failure Chronic mod to severe MR  -- LVEF 25-30%  per 06/19/22 TTE. Non-ischemic, LHC this admission stable with no evidence of CAD. CO 2.6 fick, CI 1.3. -- Poor prognosis, unfortunately no options from AHF medication/therapy standpoint. Given prior non-compliance and patient needing heart-kidney transplant as only survival option. -- volume management per HD  ESRD on HD -- per nephrology, on midodrine     Will follow nephrology recs around timing of DC  For questions or updates, please contact [facility] Please consult [email or web address] for contact info under        Signed, Laverda Page, NP  06/28/2022, 9:04 AM     I have personally seen and examined this patient. I agree with the assessment and plan as outlined above.  In HD again today. Awaiting Nephrology recs for discharge. His cardiac issues are stable.   Verne Carrow, MD, [facility] 06/28/2022 9:40 AM

## 2022-06-28 NOTE — Progress Notes (Signed)
   06/28/22 1153  Vitals  Temp 98 F (36.7 C)  Pulse Rate (!) 28  Resp (!) 25  BP (!) 98/58  SpO2 100 %  O2 Device Nasal Cannula  Post Treatment  Dialyzer Clearance Clear  Duration of HD Treatment -hour(s) 3.5 hour(s)  Hemodialysis Intake (mL) 0 mL  Liters Processed 63  Fluid Removed (mL) 700 mL  Tolerated HD Treatment Yes   Received patient in bed to unit.  Alert and oriented.  Informed consent signed and in chart.   TX duration:3.5hrs  Patient tolerated well.  Transported back to the room  Alert, without acute distress.  Hand-off given to patient's nurse.   Access used: [facility] Access issues: none  Total UF removed: Medication(s) given: none    [facility]

## 2022-06-28 NOTE — Procedures (Signed)
Patient seen and examined on Hemodialysis. The procedure was supervised and I have made appropriate changes. BP (S) 91/66 Comment: uf paused due to pt feeling hot  Pulse (!) 51   Temp 97.7 F (36.5 C)   Resp 17   Ht 5\' 9"  (1.753 m)   Wt 71.8 kg   SpO2 100%   BMI 23.38 kg/m   QB 400 mL/min via TDC, UF goal 1L  Tolerating treatment without complaints at this time.   Bufford Buttner MD [facility] [phone] 11:01 AM

## 2022-06-28 NOTE — Addendum Note (Signed)
Addended by: Dennis Bast F on: 06/28/2022 10:05 AM   Modules accepted: Orders

## 2022-06-28 NOTE — Discharge Summary (Addendum)
Discharge Summary    Patient ID: Nathaniel White MRN: [id number]; DOB: 1960/02/07  Admit date: 06/19/2022 Discharge date: 06/28/2022  PCP:  Claiborne Rigg, NP   [facility] Providers Cardiologist:  Verne Carrow, MD    Discharge Diagnoses    Principal Problem:   VT (ventricular tachycardia) (HCC) Active Problems:   HTN (hypertension)   Type 2 diabetes mellitus (HCC)   HFrEF (heart failure with reduced ejection fraction) (HCC)   Severe pulmonary hypertension (HCC)   Diabetes mellitus type 2, controlled, without complications (HCC)  Diagnostic Studies/Procedures    LHC/RHC 06/21/2022     LV end diastolic pressure is mildly elevated.   Patient is on Eliquis with A-fib history.   Nonischemic dilated cardiomyopathy with essentially normal coronary arteries.   Severely elevated right heart pressures with RV pressure 81/10, and mean pulmonary artery pressure at 46.   Elevated SVR and PVR.   06/19/22 Limited TTE   IMPRESSIONS     1. Left ventricular ejection fraction, by estimation, is 25 to 30%. The  left ventricle has severely decreased function. The left ventricle has no  regional wall motion abnormalities. Left ventricular diastolic function  could not be evaluated.   2. Right ventricular systolic function is severely reduced. The right  ventricular size is moderately enlarged.   3. Left atrial size was severely dilated.   4. Right atrial size was mildly dilated.   5. The mitral valve is abnormal. Moderate to severe mitral valve  regurgitation due to poor leaflet coaptation and a restricted posterior  leaflet. By quantitative measures, MR is severe with an EROA of 0.54cm2  and regurgitant volume of 72ml.   6. The aortic valve is normal in structure. Aortic valve regurgitation is  not visualized. No aortic stenosis is present.   7. The inferior vena cava is normal in size with greater than 50%  respiratory variability, suggesting right atrial  pressure of 3 mmHg.   FINDINGS   Left Ventricle: Left ventricular ejection fraction, by estimation, is 25  to 30%. The left ventricle has severely decreased function. The left  ventricle has no regional wall motion abnormalities. The left ventricular  internal cavity size was normal in  size. There is borderline concentric left ventricular hypertrophy. Left  ventricular diastolic function could not be evaluated. Left ventricular  diastolic function could not be evaluated due to atrial fibrillation.   Right Ventricle: The right ventricular size is moderately enlarged. No  increase in right ventricular wall thickness. Right ventricular systolic  function is severely reduced.   Left Atrium: Left atrial size was severely dilated.   Right Atrium: Right atrial size was mildly dilated.   Pericardium: There is no evidence of pericardial effusion.   Mitral Valve: The mitral valve is abnormal. Moderate to severe mitral  valve regurgitation. No evidence of mitral valve stenosis.   Tricuspid Valve: The tricuspid valve is normal in structure. Tricuspid  valve regurgitation is trivial. No evidence of tricuspid stenosis.   Aortic Valve: The aortic valve is normal in structure. Aortic valve  regurgitation is not visualized. No aortic stenosis is present.   Pulmonic Valve: The pulmonic valve was normal in structure. Pulmonic valve  regurgitation is not visualized. No evidence of pulmonic stenosis.   Aorta: The aortic root is normal in size and structure.   Venous: The inferior vena cava is normal in size with greater than 50%  respiratory variability, suggesting right atrial pressure of 3 mmHg _____________   History of Present  Illness     Nathaniel White is a 62 y.o. male with ESRD on dialysis Tuesday, Thursday, and Saturday, HFrEF with EF of 30 to 35%, paroxysmal atrial fibrillation, and chronic hypertension who was seen 06/20/2022 for the evaluation of wide-complex tachycardia. Presented to  the emergency department via EMS with palpitations and found to be in a wide-complex tachycardia concerning for VT.  Noted that he had not been having full fluid removal at the past few dialysis sessions.  This was mostly due to his hypotension.  Thursday of the week prior, he had 1 to 1.5 L taken off and he said he felt poorly after this.  And then he had a limited fluid removal that Saturday.  The day of admission he had 1 L taken off and began having intermittent palpitations and shortness of breath.  He was seen in the office and was sent for an urgent echo and started on amiodarone p.o.   On his echo noted to have EF of 30 to 35% and severe MR secondary to any restricted posterior leaflet and poor coaptation.   On arrival he had low blood pressures.  He was intermittently going into runs of VT that were lasting for 1 to 2 minutes.  1 of these captured on ECG.  Does not appear to be A-fib with RVR.  He was started on IV amiodarone infusion in the emergency department and cardiology was called for admission.  Hospital Course     Consultants: nephrology, palliative, AHF  Wide complex tachycardia -- Patient initially presented to the ED with EMS after calling for palpitations and being found with wide complex tachycardia concerning for VT. Patient had been seen in cardiology clinic on 6/11 and there was concern for wide complex tachycardia, possible VT at that time. Initially placed on IV amiodarone and lidocaine. -- transitioned to Amiodarone 400mg  BID and loaded for one week. Will transition to 200mg  BID starting tomorrow x 7 days with plans to transition to 200mg  daily thereafter -- additional therapy has been limited in the setting of hypotension and bradycardia   Chronic atrial fibrillation -- remains rate controlled -- Continue amiodarone and Eliquis.    Acute on chronic systolic CHF NICM Cardiogenic shock  BiV failure Pulmonary HTN Chronic mod to severe MR  -- LVEF 25-30% per 06/19/22  TTE. Non-ischemic, LHC this admission stable with no evidence of CAD. CO 2.6 fick, CI 1.3. -- Poor prognosis, unfortunately no options from AHF medication/therapy standpoint. Given prior non-compliance and patient would need heart-kidney transplant as only survival option -- volume management per HD   ESRD on HD Hyperkalemia -- per nephrology recs -- has been on midodrine in the setting of hypotension   Hx of CVA -- residual hemiparesis  GOC -- evaluated by palliative care medicine, family discussions had -- patient wished to continue on HD as long as possible -- DNR  Patient was seen by Dr. Clifton James and deemed appropriate for discharge home. Follow up in the office has been arranged. Medications sent to patients pharmacy of choice.  _____________  Discharge Vitals Blood pressure 104/78, pulse 66, temperature (!) 97.5 F (36.4 C), temperature source Oral, resp. rate 19, height 5\' 9"  (1.753 m), weight 71.1 kg, SpO2 100 %.  Filed Weights   06/27/22 1142 06/28/22 0742 06/28/22 1153  Weight: 71.4 kg 71.8 kg 71.1 kg    Labs & Radiologic Studies    CBC Recent Labs    06/27/22 0820 06/28/22 0843  WBC 6.7 6.6  HGB  14.8 15.2  HCT 46.6 48.9  MCV 92.8 92.6  PLT 118* 119*   Basic Metabolic Panel Recent Labs    16/10/96 0104 06/27/22 0820 06/28/22 0129 06/28/22 0843  NA  --  134*  --  133*  K  --  5.4*  --  5.6*  CL  --  94*  --  92*  CO2  --  22  --  23  GLUCOSE  --  81  --  89  BUN  --  87*  --  75*  CREATININE  --  8.83*  --  8.67*  CALCIUM  --  9.4  --  9.3  MG 2.5*  --  2.3  --   PHOS  --  7.7*  --  8.1*   Liver Function Tests Recent Labs    06/27/22 0820 06/28/22 0843  ALBUMIN 3.0* 3.2*   No results for input(s): "LIPASE", "AMYLASE" in the last 72 hours. High Sensitivity Troponin:   Recent Labs  Lab 06/12/22 1405 06/12/22 1527  TROPONINIHS 89* 92*    BNP Invalid input(s): "POCBNP" D-Dimer No results for input(s): "DDIMER" in the last 72  hours. Hemoglobin A1C No results for input(s): "HGBA1C" in the last 72 hours. Fasting Lipid Panel No results for input(s): "CHOL", "HDL", "LDLCALC", "TRIG", "CHOLHDL", "LDLDIRECT" in the last 72 hours. Thyroid Function Tests No results for input(s): "TSH", "T4TOTAL", "T3FREE", "THYROIDAB" in the last 72 hours.  Invalid input(s): "FREET3" _____________  ECHOCARDIOGRAM LIMITED  Result Date: 06/21/2022    ECHOCARDIOGRAM LIMITED REPORT   Patient Name:   CAMIREN DORWARD Date of Exam: 06/19/2022 Medical Rec #:  [id number]       Height:       69.0 in Accession #:    [id number]      Weight:       169.8 lb Date of Birth:  January 26, 1960       BSA:          1.927 m Patient Age:    61 years        BP:           138/60 mmHg Patient Gender: M               HR:           95 bpm. Exam Location:  [facility] Procedure: Limited Echo, Limited Color Doppler and Cardiac Doppler Indications:     SOB (shortness of breath) [R06.02]; Pericardial effusion  History:         Patient has prior history of Echocardiogram examinations, most                  recent 09/05/2021. CHF, COPD and Stroke, Arrythmias:Atrial                  Fibrillation; Risk Factors:Diabetes and Hypertension.  Sonographer:     Thurman Coyer RDCS Referring Phys:  [id number] Bio WEAVER Diagnosing Phys: Dorthula Nettles IMPRESSIONS  1. Left ventricular ejection fraction, by estimation, is 25 to 30%. The left ventricle has severely decreased function. The left ventricle has no regional wall motion abnormalities. Left ventricular diastolic function could not be evaluated.  2. Right ventricular systolic function is severely reduced. The right ventricular size is moderately enlarged.  3. Left atrial size was severely dilated.  4. Right atrial size was mildly dilated.  5. The mitral valve is abnormal. Moderate to severe mitral valve regurgitation due to poor leaflet coaptation and a restricted posterior leaflet. By quantitative  measures, MR is severe with an EROA of  0.54cm2 and regurgitant volume of 72ml.  6. The aortic valve is normal in structure. Aortic valve regurgitation is not visualized. No aortic stenosis is present.  7. The inferior vena cava is normal in size with greater than 50% respiratory variability, suggesting right atrial pressure of 3 mmHg. FINDINGS  Left Ventricle: Left ventricular ejection fraction, by estimation, is 25 to 30%. The left ventricle has severely decreased function. The left ventricle has no regional wall motion abnormalities. The left ventricular internal cavity size was normal in size. There is borderline concentric left ventricular hypertrophy. Left ventricular diastolic function could not be evaluated. Left ventricular diastolic function could not be evaluated due to atrial fibrillation. Right Ventricle: The right ventricular size is moderately enlarged. No increase in right ventricular wall thickness. Right ventricular systolic function is severely reduced. Left Atrium: Left atrial size was severely dilated. Right Atrium: Right atrial size was mildly dilated. Pericardium: There is no evidence of pericardial effusion. Mitral Valve: The mitral valve is abnormal. Moderate to severe mitral valve regurgitation. No evidence of mitral valve stenosis. Tricuspid Valve: The tricuspid valve is normal in structure. Tricuspid valve regurgitation is trivial. No evidence of tricuspid stenosis. Aortic Valve: The aortic valve is normal in structure. Aortic valve regurgitation is not visualized. No aortic stenosis is present. Pulmonic Valve: The pulmonic valve was normal in structure. Pulmonic valve regurgitation is not visualized. No evidence of pulmonic stenosis. Aorta: The aortic root is normal in size and structure. Venous: The inferior vena cava is normal in size with greater than 50% respiratory variability, suggesting right atrial pressure of 3 mmHg. IAS/Shunts: No atrial level shunt detected by color flow Doppler. LEFT VENTRICLE PLAX 2D LVIDd:          5.80 cm LVIDs:         4.40 cm LV PW:         1.10 cm LV IVS:        1.00 cm LVOT diam:     2.00 cm LVOT Area:     3.14 cm  LEFT ATRIUM            Index LA diam:      5.00 cm  2.59 cm/m LA Vol (A4C): 112.0 ml 58.12 ml/m   AORTA Ao Root diam: 3.50 cm Ao Asc diam:  3.50 cm MR Peak grad:    79.6 mmHg    TRICUSPID VALVE MR Mean grad:    51.0 mmHg    TR Peak grad:   56.0 mmHg MR Vmax:         446.00 cm/s  TR Vmax:        374.00 cm/s MR Vmean:        334.0 cm/s MR PISA:         7.60 cm     SHUNTS MR PISA Eff ROA: 54 mm       Systemic Diam: 2.00 cm MR PISA Radius:  1.10 cm Aditya Sabharwal Electronically signed by Dorthula Nettles Signature Date/Time: 06/19/2022/5:32:22 PM    Final (Updated)    CARDIAC CATHETERIZATION  Result Date: 06/21/2022   LV end diastolic pressure is mildly elevated.   Patient is on Eliquis with A-fib history. Nonischemic dilated cardiomyopathy with essentially normal coronary arteries. Severely elevated right heart pressures with RV pressure 81/10, and mean pulmonary artery pressure at 46. Elevated SVR and PVR. RECOMMENDATIONS: Patient was admitted with new onset ventricular tachycardia, currently on amiodarone and lidocaine.  He currently is  in atrial fibrillation.  He has significant volume overload and CRRT was initiated yesterday.  Plan to resume postprocedure.  Advanced heart failure team has also been notified in light of his significant right heart pressure elevations.  He has been documented to have moderately severe mitral regurgitation on echocardiography with a EF at 30 to 35%.   DG Chest Portable 1 View  Result Date: 06/19/2022 CLINICAL DATA:  Shortness of breath, chest pain and irregular heart beat. EXAM: PORTABLE CHEST 1 VIEW COMPARISON:  June 12, 2022 FINDINGS: There is stable left-sided venous catheter positioning. Stable moderate to marked severity enlargement of the cardiac silhouette is seen. Stable mild to moderate severity diffusely increased interstitial lung  markings are present with stable mild to moderate severity areas of bibasilar atelectasis. There is a small, stable right pleural effusion. No pneumothorax is identified. The visualized skeletal structures are unremarkable. IMPRESSION: 1. Stable cardiomegaly and mild to moderate severity interstitial edema with stable mild to moderate severity bibasilar atelectasis. 2. Small, stable right pleural effusion. Electronically Signed   By: Aram Candela M.D.   On: 06/19/2022 23:16   DG Chest 2 View  Result Date: 06/12/2022 CLINICAL DATA:  Shortness of breath.  Had dialysis today. EXAM: CHEST - 2 VIEW COMPARISON:  Chest radiographs 02/24/2022 in 09/17/2021 FINDINGS: Left internal jugular dual-lumen central venous catheter tips again overlie the superior vena cava/right atrial junction. Cardiac silhouette is again moderately to markedly enlarged, unchanged. Mediastinal contours are grossly within normal limits. Calcification is seen within the aortic arch. The There are again moderately decreased lung volumes. There are patchy heterogeneous airspace opacities within the bilateral mid to lower lungs that are slightly increased from 02/24/2022 which may be due to the lower lung volumes. There is chronic bilateral mid and lower lung interstitial thickening. There is posterior costophrenic angle blunting and probable small pleural effusions, similar to 02/24/2022 radiographs. No acute skeletal abnormality. IMPRESSION: 1. Slightly increased patchy heterogeneous airspace opacities within the bilateral mid to lower lungs which may be due to the lower lung volumes. Superimposed mild pulmonary edema is also possible. 2. Chronic bilateral mid and lower lung interstitial thickening. 3. Probable small bilateral pleural effusions, similar to 02/24/2022. 4. Stable cardiomegaly. Electronically Signed   By: Neita Garnet M.D.   On: 06/12/2022 15:34   Disposition   Pt is being discharged home today in good condition.  Follow-up  Plans & Appointments     Follow-up Information     Beatrice Lecher, PA-C Follow up on 07/03/2022.   Specialties: Cardiology, Physician Assistant Why: at 2:45pm for your follow up appt Contact information: [address] [place] [address] [phone]                Discharge Instructions     (HEART FAILURE PATIENTS) Call MD:  Anytime you have any of the following symptoms: 1) 3 pound weight gain in 24 hours or 5 pounds in 1 week 2) shortness of breath, with or without a dry hacking cough 3) swelling in the hands, feet or stomach 4) if you have to sleep on extra pillows at night in order to breathe.   Complete by: As directed    Call MD for:  difficulty breathing, headache or visual disturbances   Complete by: As directed    Call MD for:  persistant dizziness or light-headedness   Complete by: As directed    Diet - low sodium heart healthy   Complete by: As directed  Increase activity slowly   Complete by: As directed         Discharge Medications   Allergies as of 06/28/2022       Reactions   Codeine Shortness Of Breath   Dextromethorphan-guaifenesin Shortness Of Breath, Other (See Comments)   Iron Dextran Shortness Of Breath, Other (See Comments)   Morphine And Codeine Shortness Of Breath   Other Reaction(s): Other (See Comments)        Medication List     TAKE these medications    acetaminophen 500 MG tablet Commonly known as: TYLENOL Take 1 tablet (500 mg total) by mouth every 6 (six) hours as needed.   albuterol 108 (90 Base) MCG/ACT inhaler Commonly known as: ProAir HFA Inhale 2 puffs into the lungs every 6 (six) hours as needed for wheezing or shortness of breath.   albuterol (2.5 MG/3ML) 0.083% nebulizer solution Commonly known as: PROVENTIL Take 3 mLs (2.5 mg total) by nebulization every 6 (six) hours as needed for wheezing or shortness of breath.   amiodarone 200 MG tablet Commonly known as: PACERONE Take 1 tablet  (200 mg total) by mouth 2 (two) times daily for 7 days, THEN 1 tablet (200 mg total) daily. Start taking on: June 28, 2022 What changed: See the new instructions.   Eliquis 5 MG Tabs tablet Generic drug: apixaban Take 5 mg by mouth 2 (two) times daily.   fluticasone-salmeterol 250-50 MCG/ACT Aepb Commonly known as: Advair Diskus Inhale 1 puff into the lungs in the morning and at bedtime. NEEDS PASS   hydrOXYzine 10 MG tablet Commonly known as: ATARAX Take 1 tablet (10 mg total) by mouth 3 (three) times daily as needed. For anxiety   IRON PO Take 1 tablet by mouth in the morning, at noon, and at bedtime.   midodrine 5 MG tablet Commonly known as: PROAMATINE Take one tablet by mouth 3 times daily on non-dialysis days, Mon, Wed, Fri and Sun.   midodrine 10 MG tablet Commonly known as: PROAMATINE Take 10 mg by mouth See admin instructions. Take 1 tablet by mouth twice a day every Tuesday, Thursday and Saturday   montelukast 10 MG tablet Commonly known as: SINGULAIR Take 1 tablet (10 mg total) by mouth at bedtime.   sildenafil 100 MG tablet Commonly known as: Viagra Take 0.5-1 tablets (50-100 mg total) by mouth daily as needed for erectile dysfunction.   Velphoro 500 MG chewable tablet Generic drug: sucroferric oxyhydroxide Chew 1,000 mg by mouth 2 (two) times daily after a meal.   Visine 0.05 % ophthalmic solution Generic drug: tetrahydrozoline Place 2-3 drops into both eyes 2 (two) times daily as needed (dry eye).         Outstanding Labs/Studies   N/a  Duration of Discharge Encounter   Greater than 30 minutes including physician time.  Signed, Laverda Page, NP 06/28/2022, 3:44 PM

## 2022-06-28 NOTE — Progress Notes (Signed)
[facility] Progress Note   Subjective:  Seen on dialysis- Doing well, getting midodrine.    Objective Vitals:   06/28/22 0930 06/28/22 1000 06/28/22 1030 06/28/22 1045  BP: 117/80 111/74 (!) 94/52 (S) 91/66  Pulse: 91 (!) 54 77 (!) 51  Resp: 20 17 18 17   Temp:      TempSrc:      SpO2: 100% 100% 100% 100%  Weight:      Height:       Physical Exam General: Chronically ill appearing man, NAD. Room air Heart: Irregularly irregular Lungs: CTA anteriorly, dull B bases Abdomen: soft, non-tender Extremities: 1+ BLE edema Dialysis Access: [facility]  Additional Objective Labs: Basic Metabolic Panel: Recent Labs  Lab 06/22/22 0032 06/23/22 0104 06/26/22 0127 06/27/22 0820 06/28/22 0843  NA 134*   < > 132* 134* 133*  K 4.9   < > 5.1 5.4* 5.6*  CL 97*   < > 92* 94* 92*  CO2 21*   < > 21* 22 23  GLUCOSE 98   < > 81 81 89  BUN 21*   < > 42* 87* 75*  CREATININE 3.71*   < > 6.39* 8.83* 8.67*  CALCIUM 9.7   < > 9.7 9.4 9.3  PHOS 3.2  --   --  7.7* 8.1*   < > = values in this interval not displayed.   Liver Function Tests: Recent Labs  Lab 06/22/22 0032 06/27/22 0820 06/28/22 0843  ALBUMIN 3.9 3.0* 3.2*   CBC: Recent Labs  Lab 06/22/22 0031 06/25/22 0144 06/27/22 0820 06/28/22 0843  WBC 4.6 8.6 6.7 6.6  HGB 16.8 15.5 14.8 15.2  HCT 54.7* 49.7 46.6 48.9  MCV 97.3 92.4 92.8 92.6  PLT 65* 101* 118* 119*   Medications:  sodium chloride     sodium chloride      amiodarone  400 mg Oral BID   Followed by   Melene Muller ON 06/29/2022] amiodarone  200 mg Oral BID   apixaban  5 mg Oral BID   Chlorhexidine Gluconate Cloth  6 each Topical Daily   Chlorhexidine Gluconate Cloth  6 each Topical Q0600   Chlorhexidine Gluconate Cloth  6 each Topical Q0600   feeding supplement (NEPRO CARB STEADY)  237 mL Oral BID BM   midodrine  10 mg Oral TID   mometasone-formoterol  2 puff Inhalation BID   multivitamin  1 tablet Oral QHS   senna-docusate  1 tablet Oral Daily    sodium chloride flush  3 mL Intravenous Q12H   sodium chloride flush  3 mL Intravenous Q12H   sodium chloride flush  3 mL Intravenous Q12H   sucroferric oxyhydroxide  1,000 mg Oral BID PC    Dialysis Orders: TTS at AF 4:15hr, 450/500, EDW 70.5kg, 2K/2Ca, TDC, no heparin - Last HD 6/11, unable tp reach EDW for > 2 weeks, last post-wt 77.2kg - Hectoral IV q HD - Parsabiv 7.5mg  IV q HD   Assessment/Plan: Cardiogenic shock: Presented to ED with VT. Cardiology consulted - s/p IV amiodarone and lidocaine drips, now changed to high dose PO amiodarone + Eliquis. Biventricular HF (EF 25-30%), severe MR and TR on RHC 6/13.  Hypotension/volume: Able to get decent amount of volume off with CRRT, now below prior EDW, still with some edema. S/p regular HD 6/15 with no UF to ease him back into it - did fine. On midodrine 10mg  TID now. Dyspnea/pulm edema: In setting of overload and #1. ESRD: CRRT 6/12- 6/14 due  to the above symptoms - tolerated HD 6/15, but short run and no UF. HD 6/17 with 1.1L UF.  Another short rx 6/19, now today back on TTS schedule Anemia of ERSD: Hgb quite high > 16. Looking through chart - he has significant acquired cystic kidney disease (as of CT 2023) - likely the cause of the high Hgb. Would recommend re-imaging the kidneys, more than likely has developing RCC, although with the above issues palliative care is involved, so may or may not change the outcome for him. Secondary HPTH: Ca high, VDRA on hold. Parsabiv not on [facility] formulary. Continue binders. A-flutter/fib: On Eliquis, amio Hx CVA with residual hemiparesis COPD GOC: Appreciate palliative care involvement, continue HD as long as he can.   Dispo: Ok to d/c from renal perspective after HD today  Bufford Buttner MD 06/28/2022, 11:00 AM  [facility]

## 2022-06-28 NOTE — Progress Notes (Addendum)
Pt receives out-pt HD at [facility] on TTS. Will assist as needed.   Olivia Canter Renal Navigator [phone]  Addendum at 2:29 pm: D/C order noted. Contacted [facility] to advise clinic of pt's d/c today and that pt should resume care on Saturday.

## 2022-06-29 ENCOUNTER — Telehealth: Payer: Self-pay | Admitting: Nephrology

## 2022-06-29 NOTE — Telephone Encounter (Signed)
Transition of Care - Initial Contact from Inpatient Facility  Date of discharge: 06/28/22 Date of contact: 06/29/22  Method: Phone Spoke to: Patient  Patient contacted to discuss transition of care from recent inpatient hospitalization. Patient was admitted to [facility] 6/11-6/20/24 with discharge diagnosis of wide complex tachycardia/cardiogenic shock   The discharge medication list was reviewed. Patient understands the changes and has no concerns.   Patient will return to his/her outpatient HD unit on:  Saturday 6/22  No other concerns at this time.

## 2022-06-29 NOTE — Plan of Care (Signed)
[facility] Patient Discharge Orders- [facility] CLINIC: [facility]  Patient's name: Nathaniel White Admit/DC Dates: 06/19/2022 - 06/28/2022  Discharge Diagnoses: VT - Cardiology consulted. Cont amiodarone + Eliquis  Cardiogenic shock/EF 25-30%-  Poor prognosis GOC/Palliative consult - DNR/DNI   Aranesp: Given: --   Date and amount of last dose: --  Last Hgb: 15.2 PRBC's Given: -- Date/# of units: -- ESA dose for discharge: -- IV Iron dose at discharge: --  Heparin change: --  EDW Change: -- New EDW:   Bath Change: --  Access intervention/Change:  Details:  Hectorol/Calcitriol change: --  Discharge Labs: Calcium 9.3 Phosphorus 8.1 Albumin 3.2 K+ 5.6  IV Antibiotics: -- Details:  On Coumadin?: -- Last INR: Next INR: Managed By:   OTHER/APPTS/LAB ORDERS:    D/C Meds to be reconciled by nurse after every discharge.  Completed By:  Tomasa Blase PA-C [facility] 06/29/2022,11:36 AM   Reviewed by: MD:______ RN_______

## 2022-07-03 ENCOUNTER — Ambulatory Visit: Payer: Medicare Other | Attending: Physician Assistant | Admitting: Physician Assistant

## 2022-07-03 ENCOUNTER — Encounter: Payer: Self-pay | Admitting: Physician Assistant

## 2022-07-03 ENCOUNTER — Telehealth: Payer: Self-pay

## 2022-07-03 VITALS — BP 128/60 | HR 53 | Ht 69.0 in | Wt 160.2 lb

## 2022-07-03 DIAGNOSIS — I472 Ventricular tachycardia, unspecified: Secondary | ICD-10-CM

## 2022-07-03 DIAGNOSIS — I480 Paroxysmal atrial fibrillation: Secondary | ICD-10-CM | POA: Diagnosis not present

## 2022-07-03 DIAGNOSIS — I5020 Unspecified systolic (congestive) heart failure: Secondary | ICD-10-CM | POA: Diagnosis not present

## 2022-07-03 DIAGNOSIS — N186 End stage renal disease: Secondary | ICD-10-CM

## 2022-07-03 DIAGNOSIS — I340 Nonrheumatic mitral (valve) insufficiency: Secondary | ICD-10-CM | POA: Insufficient documentation

## 2022-07-03 DIAGNOSIS — Z992 Dependence on renal dialysis: Secondary | ICD-10-CM

## 2022-07-03 NOTE — Transitions of Care (Post Inpatient/ED Visit) (Signed)
   07/03/2022  Name: Nathaniel White MRN: [id number] DOB: 25-Apr-1960  Today's TOC FU Call Status: Today's TOC FU Call Status:: Unsuccessul Call (1st Attempt) Unsuccessful Call (1st Attempt) Date: 07/03/22  Attempted to reach the patient regarding the most recent Inpatient/ED visit.  Follow Up Plan: Additional outreach attempts will be made to reach the patient to complete the Transitions of Care (Post Inpatient/ED visit) call.   Signature Robyne Peers, RN

## 2022-07-03 NOTE — Assessment & Plan Note (Addendum)
He has remained in atrial fibrillation.  Rate is controlled.  He is weight is over 60 kg and his age is <80.  Continue Eliquis 5 mg twice daily.

## 2022-07-03 NOTE — Assessment & Plan Note (Addendum)
Nonischemic cardiomyopathy.  Cardiac catheterization in the hospital demonstrated no CAD.  End stage heart failure.  He was seen by the advanced heart failure team in the hospital.  His only chance for survival is renal and heart transplant.  He is not a candidate for this secondary to nonadherence.  He is now DNR.  He has had difficulty with hypotension in the past.  This is limited GDMT.  Volume is managed with dialysis.  His volume is improved on exam compared to the last time I saw him just prior to admission.  If his blood pressure increases over time, we can certainly consider adding hydralazine/nitrates/beta-blocker.

## 2022-07-03 NOTE — Assessment & Plan Note (Signed)
He remains on Tuesday, Thursday, Saturday dialysis.  It sounds like he has been able to get to his dry weight.

## 2022-07-03 NOTE — Assessment & Plan Note (Signed)
Status post recent admission with ventricular tachycardia and cardiogenic shock in the setting of biventricular/end-stage heart failure.  VT seems to be quiescent on amiodarone therapy.  We discussed potential side effects with this medication.  He will remain on amiodarone 200 mg twice daily for a total of 7 days and then reduce to 200 mg daily.  I will see him back in 6 to 8 weeks.  I will get an EKG at that visit as well as LFTs, TSH.  I have encouraged him to start seeing an eye doctor.

## 2022-07-03 NOTE — Patient Instructions (Signed)
Medication Instructions:  Your physician recommends that you continue on your current medications as directed. Please refer to the Current Medication list given to you today.  *If you need a refill on your cardiac medications before your next appointment, please call your pharmacy*   Lab Work: None ordered today, will check LFT & TSH at your next appointment.   If you have labs (blood work) drawn today and your tests are completely normal, you will receive your results only by: MyChart Message (if you have MyChart) OR A paper copy in the mail If you have any lab test that is abnormal or we need to change your treatment, we will call you to review the results.   Testing/Procedures: None ordered   Follow-Up: At [facility], you and your health needs are our priority.  As part of our continuing mission to provide you with exceptional heart care, we have created designated Provider Care Teams.  These Care Teams include your primary Cardiologist (physician) and Advanced Practice Providers (APPs -  Physician Assistants and Nurse Practitioners) who all work together to provide you with the care you need, when you need it.  We recommend signing up for the patient portal called "MyChart".  Sign up information is provided on this After Visit Summary.  MyChart is used to connect with patients for Virtual Visits (Telemedicine).  Patients are able to view lab/test results, encounter notes, upcoming appointments, etc.  Non-urgent messages can be sent to your provider as well.   To learn more about what you can do with MyChart, go to [email or web address].    Your next appointment:   8 week(s)  Provider:   [name], PA-C         Other Instructions

## 2022-07-03 NOTE — Assessment & Plan Note (Signed)
Moderate to severe MR.  He is not a candidate for advanced therapies/surgery.

## 2022-07-03 NOTE — Progress Notes (Signed)
Cardiology Office Note:    Date:  07/03/2022  ID:  Nathaniel White, DOB January 25, 1960, MRN [id number] PCP: Claiborne Rigg, NP  [facility] Providers Cardiologist:  Verne Carrow, MD       Patient Profile:      (HFrEF) heart failure with reduced ejection fraction  Non-ischemic cardiomyopathy  LHC 10/2013: No CAD R/L heart cath 06/21/2022: Normal coronary arteries, mean PAP 46, mean PCWP 31, CO 2.6 TTE 01/21/2019: EF 40-45, RVSP 40.8, mild MR TTE 03/13/2021: EF 35, RVSP 78.7, moderate MR TTE 09/05/2021: EF 30-35, severe LVH, RVSP 62.5, mod-severe MR, TR  TTE: EF 25-30, no RWMA, severely reduced RVSF, severe LAE, mild RAE, moderate-severe MR, RAP 3 Mitral regurgitation (mod to severe) Paroxsymal atrial fibrillation  Hx of pulmonary embolism  Pulmonary hypertension  Aortic atherosclerosis  ESRD (Tues, Thurs, Sat) S/p failed Renal Tx Chronic Obstructive Pulmonary Disease  Diabetes mellitus  Hx of CVA  Hypotension Midodrine Rx  Hyperlipidemia  Peripheral arterial disease  Hx of GI bleed  DNR      History of Present Illness:   Nathaniel White is a 62 y.o. male who returns for post hospital follow up. He was last seen in clinic 06/19/22 for episodic palpitations and shortness of breath. He had not been able to complete dialysis sessions. Limited TTE done that afternoon did not show pericardial effusion. EF was stable at 25-30. We were concerned he could be having VT and placed him on Amiodarone. He had more palpitations that night and went to the ED. He was admitted 6/11-6/20. He presented with VT and hypotension (cardiogenic shock). He was started on IV Amiodarone, IV Lidocaine. He was ultimately transitioned to oral Amio (400 twice daily x 1 week >> 200 twice daily x 1 week >> 200 once daily). He was evaluated by the Advanced HF Team for biventricular failure/end-stage heart failure. Cath showed no CAD. He was not fel to be a candidate for advanced therapies. His only option for  survival was felt to be renal + heart transplant. However, he is not a candidate due to prior non-adherence. Palliative care was recommended. He was followed by nephrology and underwent CRRT for volume management.   He is here alone.  He just got out of dialysis earlier today.  He notes that he is feeling better since starting on amiodarone.  He has not had chest discomfort, shortness of breath, syncope.  He was able to get to his dry weight today.  ROS See the HPI    Studies Reviewed:        Risk Assessment/Calculations:    CHA2DS2-VASc Score = 6   This indicates a 9.7% annual risk of stroke. The patient's score is based upon: CHF History: 1 HTN History: 1 Diabetes History: 1 Stroke History: 2 Vascular Disease History: 1 Age Score: 0 Gender Score: 0            Physical Exam:   VS:  BP 128/60   Pulse (!) 53   Ht 5\' 9"  (1.753 m)   Wt 160 lb 3.2 oz (72.7 kg)   SpO2 93%   BMI 23.66 kg/m    Wt Readings from Last 3 Encounters:  07/03/22 160 lb 3.2 oz (72.7 kg)  06/28/22 156 lb 12 oz (71.1 kg)  06/19/22 169 lb 12.1 oz (77 kg)    Constitutional:      Appearance: Not in distress. Chronically ill-appearing.  Pulmonary:     Breath sounds: No wheezing. No rales.  Cardiovascular:  Normal rate. Irregularly irregular rhythm.     Murmurs: There is a grade 2/6 systolic murmur at the LLSB.  Edema:    Peripheral edema present.    Ankle: bilateral trace edema of the ankle. Abdominal:     Palpations: Abdomen is soft.  Skin:    General: Skin is warm and dry.       ASSESSMENT AND PLAN:   VT (ventricular tachycardia) (HCC) Status post recent admission with ventricular tachycardia and cardiogenic shock in the setting of biventricular/end-stage heart failure.  VT seems to be quiescent on amiodarone therapy.  We discussed potential side effects with this medication.  He will remain on amiodarone 200 mg twice daily for a total of 7 days and then reduce to 200 mg daily.  I will see  him back in 6 to 8 weeks.  I will get an EKG at that visit as well as LFTs, TSH.  I have encouraged him to start seeing an eye doctor.  HFrEF (heart failure with reduced ejection fraction) (HCC) Nonischemic cardiomyopathy.  Cardiac catheterization in the hospital demonstrated no CAD.  End stage heart failure.  He was seen by the advanced heart failure team in the hospital.  His only chance for survival is renal and heart transplant.  He is not a candidate for this secondary to nonadherence.  He is now DNR.  He has had difficulty with hypotension in the past.  This is limited GDMT.  Volume is managed with dialysis.  His volume is improved on exam compared to the last time I saw him just prior to admission.  If his blood pressure increases over time, we can certainly consider adding hydralazine/nitrates/beta-blocker.  Paroxysmal atrial fibrillation (HCC) He has remained in atrial fibrillation.  Rate is controlled.  He is weight is over 60 kg and his age is <80.  Continue Eliquis 5 mg twice daily.  Mitral regurgitation Moderate to severe MR.  He is not a candidate for advanced therapies/surgery.  End-stage renal disease on hemodialysis [facility]) He remains on Tuesday, Thursday, Saturday dialysis.  It sounds like he has been able to get to his dry weight.    Dispo:  Return in about 8 weeks (around 08/28/2022) for w/ [name], PA-C.  Signed, [name], PA-C

## 2022-07-04 ENCOUNTER — Telehealth: Payer: Self-pay

## 2022-07-04 NOTE — Transitions of Care (Post Inpatient/ED Visit) (Signed)
07/04/2022  Name: Nathaniel White MRN: [id number] DOB: 09/01/1960  Today's TOC FU Call Status: Today's TOC FU Call Status:: Successful TOC FU Call Competed Unsuccessful Call (1st Attempt) Date: 07/03/22 [facility] FU Call Complete Date: 07/04/22  Transition Care Management Follow-up Telephone Call Date of Discharge: 06/28/22 Discharge Facility: [facility]) Type of Discharge: Inpatient Admission Primary Inpatient Discharge Diagnosis:: VT How have you been since you were released from the hospital?: Better (He stated he is feeling fine) Any questions or concerns?: Yes Patient Questions/Concerns:: He is concerned about his worsening heart function and will speak to the provider at the heart failure clinic about his concerns at this appointment next week. Patient Questions/Concerns Addressed: Other: (patient to speak with heart fialure clinic provider.)  Items Reviewed: Did you receive and understand the discharge instructions provided?: Yes Medications obtained,verified, and reconciled?: No Medications Not Reviewed Reasons:: Other: (He said he has all of his medications as well as a nebulizer and he did not have any questions about the med regime and he did not need to review the med list) Any new allergies since your discharge?: No Dietary orders reviewed?: Yes Type of Diet Ordered:: heart healthy, low sodium Do you have support at home?: Yes People in Home: friend(s) Name of Support/Comfort Primary Source: He stated that he has 2 friends, sometimes 3, that stay with him,  Medications Reviewed Today: Medications Reviewed Today     Reviewed by Kennon Rounds (Physician Assistant) on 07/03/22 at 1522  Med List Status: <None>   Medication Order Taking? Sig Documenting Provider Last Dose Status Informant  acetaminophen (TYLENOL) 500 MG tablet [id number] Yes Take 1 tablet (500 mg total) by mouth every 6 (six) hours as needed. Georgetta Haber, NP Taking Active Self  albuterol  (PROAIR HFA) 108 (90 Base) MCG/ACT inhaler [id number] Yes Inhale 2 puffs into the lungs every 6 (six) hours as needed for wheezing or shortness of breath. Hoy Register, MD Taking Active Self  albuterol (PROVENTIL) (2.5 MG/3ML) 0.083% nebulizer solution [id number] Yes Take 3 mLs (2.5 mg total) by nebulization every 6 (six) hours as needed for wheezing or shortness of breath. Hoy Register, MD Taking Active Self  amiodarone (PACERONE) 200 MG tablet [id number] Yes Take 1 tablet (200 mg total) by mouth 2 (two) times daily for 7 days, THEN 1 tablet (200 mg total) daily. Arty Baumgartner, NP Taking Active   ELIQUIS 5 MG TABS tablet [id number] Yes Take 5 mg by mouth 2 (two) times daily. [provider] Taking Active Self  Ferrous Sulfate (IRON PO) [id number] Yes Take 1 tablet by mouth in the morning, at noon, and at bedtime. [provider] Taking Active Self  fluticasone-salmeterol (ADVAIR DISKUS) 250-50 MCG/ACT AEPB [id number] Yes Inhale 1 puff into the lungs in the morning and at bedtime. NEEDS PASS Claiborne Rigg, NP Taking Active Self           Med Note [facility], Elvin So   Tue Jun 19, 2022 11:43 PM)    hydrOXYzine (ATARAX) 10 MG tablet [id number] Yes Take 1 tablet (10 mg total) by mouth 3 (three) times daily as needed. For anxiety Claiborne Rigg, NP Taking Active Self           Med Note Garner Nash, MICHELE   Tue Jul 03, 2022  3:01 PM)    midodrine (PROAMATINE) 5 MG tablet [id number] Yes Take one tablet by mouth 3 times daily on non-dialysis days, Mon, Wed, Fri and Sun. Sharlene Dory, PA-C  Taking Active Self  montelukast (SINGULAIR) 10 MG tablet [id number] Yes Take 1 tablet (10 mg total) by mouth at bedtime. Claiborne Rigg, NP Taking Active Self  sildenafil (VIAGRA) 100 MG tablet [id number] Yes Take 0.5-1 tablets (50-100 mg total) by mouth daily as needed for erectile dysfunction. Claiborne Rigg, NP Taking Active Self  VELPHORO 500 MG chewable tablet [id number] Yes Chew 1,000 mg by  mouth 2 (two) times daily after a meal. [provider] Taking Active Self           Med Note (WHITE, TONIA S   Tue Jun 19, 2022 11:45 PM)    VISINE 0.05 % ophthalmic solution [id number] Yes Place 2-3 drops into both eyes 2 (two) times daily as needed (dry eye). [provider] Taking Active Self  Med List Note Salvatore Marvel, CPhT 03/13/21 2014): Dialysis on Tues/Thurs/Sat @ [facility]- [address] [place], [place] [address]  [phone]             Home Care and Equipment/Supplies: Were Home Health Services Ordered?: No Any new equipment or medical supplies ordered?: No  Functional Questionnaire: Do you need assistance with bathing/showering or dressing?: No Do you need assistance with meal preparation?: Yes (friends assist) Do you need assistance with eating?: No Do you have difficulty maintaining continence: No Do you need assistance with getting out of bed/getting out of a chair/moving?: Yes (He stated he uses a wheelchair as needed for mobility.  He has O2 that he uses at 2-3 L most of the time .) Do you have difficulty managing or taking your medications?: No  Follow up appointments reviewed: PCP Follow-up appointment confirmed?: Yes Date of PCP follow-up appointment?: 07/17/22 Follow-up Provider: Bertram Denver, NP.  He said he did not want to try to schedule an appointment to be seen sooner Specialist Hospital Follow-up appointment confirmed?: Yes Date of Specialist follow-up appointment?: 07/09/22 Follow-Up Specialty Provider:: Heart Failure clinic.  He saw the cardiologist yesterday- 07/03/2022. He attends dialysis T/T/S. Do you need transportation to your follow-up appointment?: No Do you understand care options if your condition(s) worsen?: Yes-patient verbalized understanding    SIGNATURE Robyne Peers, RN

## 2022-07-09 ENCOUNTER — Telehealth (HOSPITAL_COMMUNITY): Payer: Self-pay | Admitting: Cardiology

## 2022-07-09 ENCOUNTER — Encounter (HOSPITAL_COMMUNITY): Payer: Medicare Other | Admitting: Cardiology

## 2022-07-14 ENCOUNTER — Encounter (HOSPITAL_COMMUNITY): Payer: Self-pay

## 2022-07-14 ENCOUNTER — Emergency Department (HOSPITAL_COMMUNITY): Payer: Medicare Other

## 2022-07-14 ENCOUNTER — Other Ambulatory Visit: Payer: Self-pay

## 2022-07-14 ENCOUNTER — Emergency Department (HOSPITAL_COMMUNITY)
Admission: EM | Admit: 2022-07-14 | Discharge: 2022-07-15 | Disposition: A | Discharge status: Home / Self Care | Payer: Medicare Other | Source: Home / Self Care | Attending: Emergency Medicine | Admitting: Emergency Medicine

## 2022-07-14 DIAGNOSIS — I472 Ventricular tachycardia, unspecified: Secondary | ICD-10-CM | POA: Diagnosis not present

## 2022-07-14 DIAGNOSIS — R61 Generalized hyperhidrosis: Secondary | ICD-10-CM

## 2022-07-14 DIAGNOSIS — Z992 Dependence on renal dialysis: Secondary | ICD-10-CM | POA: Diagnosis not present

## 2022-07-14 DIAGNOSIS — Z7901 Long term (current) use of anticoagulants: Secondary | ICD-10-CM | POA: Insufficient documentation

## 2022-07-14 DIAGNOSIS — I953 Hypotension of hemodialysis: Secondary | ICD-10-CM

## 2022-07-14 DIAGNOSIS — R531 Weakness: Secondary | ICD-10-CM | POA: Diagnosis present

## 2022-07-14 LAB — CBC WITH DIFFERENTIAL/PLATELET
Abs Immature Granulocytes: 0.03 10*3/uL (ref 0.00–0.07)
Basophils Absolute: 0.1 10*3/uL (ref 0.0–0.1)
Basophils Relative: 1 %
Eosinophils Absolute: 0.3 10*3/uL (ref 0.0–0.5)
Eosinophils Relative: 4 %
HCT: 54.3 % — ABNORMAL HIGH (ref 39.0–52.0)
Hemoglobin: 16.8 g/dL (ref 13.0–17.0)
Immature Granulocytes: 0 %
Lymphocytes Relative: 11 %
Lymphs Abs: 0.8 10*3/uL (ref 0.7–4.0)
MCH: 29.7 pg (ref 26.0–34.0)
MCHC: 30.9 g/dL (ref 30.0–36.0)
MCV: 95.9 fL (ref 80.0–100.0)
Monocytes Absolute: 1.3 10*3/uL — ABNORMAL HIGH (ref 0.1–1.0)
Monocytes Relative: 18 %
Neutro Abs: 4.5 10*3/uL (ref 1.7–7.7)
Neutrophils Relative %: 66 %
Platelets: 72 10*3/uL — ABNORMAL LOW (ref 150–400)
RBC: 5.66 MIL/uL (ref 4.22–5.81)
RDW: 16.7 % — ABNORMAL HIGH (ref 11.5–15.5)
WBC: 7 10*3/uL (ref 4.0–10.5)
nRBC: 0 % (ref 0.0–0.2)

## 2022-07-14 LAB — BASIC METABOLIC PANEL
Anion gap: 27 — ABNORMAL HIGH (ref 5–15)
BUN: 27 mg/dL — ABNORMAL HIGH (ref 6–20)
CO2: 16 mmol/L — ABNORMAL LOW (ref 22–32)
Calcium: 9.9 mg/dL (ref 8.9–10.3)
Chloride: 93 mmol/L — ABNORMAL LOW (ref 98–111)
Creatinine, Ser: 7.6 mg/dL — ABNORMAL HIGH (ref 0.61–1.24)
GFR, Estimated: 8 mL/min — ABNORMAL LOW (ref >60)
Glucose, Bld: 89 mg/dL (ref 70–99)
Potassium: 5.3 mmol/L — ABNORMAL HIGH (ref 3.5–5.1)
Sodium: 136 mmol/L (ref 135–145)

## 2022-07-14 LAB — TROPONIN I (HIGH SENSITIVITY)
Troponin I (High Sensitivity): 108 ng/L (ref <18)
Troponin I (High Sensitivity): 118 ng/L (ref <18)

## 2022-07-14 NOTE — ED Provider Notes (Signed)
Received patient in turnover from Dr. Criss Alvine.  Please see their note for further details of Hx, PE.  Briefly patient is a 62 y.o. male with a Weakness .  Found to have a metabolic acidosis with anion gap.  Has a gap at baseline but higher than normal.  Symptoms have improved.  Plan for delta trop, lactic, bhba, salicylate level and recheck.  The patient's B hydroxybutyric acid was elevated.  Lactate also mildly elevated.  No infectious symptoms on repeat assessment.  Perhaps mildly dehydrated we will give a small bolus of IV fluids.  Patient's troponin did go up about 10.  Patient's last cardiology visits he was supposed to be hooked up to a telemetry monitor.  Has not occurred yet.  Will discuss with cardiology.   Discussed with Dr. Jayme Cloud, cards fellow on-call.  He felt that the troponins were flat send did not see any obvious change from the patient's baseline EKG.  He did feel it was hard to know if the patient was having intermittent episodes of ventricular tachycardia but thought more likely the patient was experiencing hypotension with his significant heart failure during dialysis.  He did feel it was reasonable to have him follow-up as an outpatient.  Discussed with the patient.  Will have him follow-up with cardiology.   Melene Plan, DO 07/15/22 [phone]

## 2022-07-14 NOTE — ED Provider Notes (Signed)
[place] EMERGENCY DEPARTMENT AT [facility] Provider Note   CSN: [id number] Arrival date & time: 07/14/22  2003     History  Chief Complaint  Patient presents with   Weakness    Nathaniel White is a 62 y.o. male.  HPI 62 year old male presents with generalized weakness.  He went to dialysis today and after they were done he had to wait extra time because his blood pressure dropped into the 60s.  Eventually came up without intervention.  He never felt symptomatic during that time.  At home, around 3 PM he started to feel weak and have some sweating.  He thought he might of been in the heat after he opened his door.  Never had chest pain or shortness of breath but he was having about 30 minutes of neck cramping in the back.  Seem to go away when someone massaged out.  Has chronic left-sided weakness from a stroke but no new weakness.  Right now he is feeling better though not completely back to normal.   Home Medications Prior to Admission medications   Medication Sig Start Date End Date Taking? Authorizing Provider  acetaminophen (TYLENOL) 500 MG tablet Take 1 tablet (500 mg total) by mouth every 6 (six) hours as needed. 04/11/18   Georgetta Haber, NP  albuterol (PROAIR HFA) 108 (90 Base) MCG/ACT inhaler Inhale 2 puffs into the lungs every 6 (six) hours as needed for wheezing or shortness of breath. 08/25/21   Hoy Register, MD  albuterol (PROVENTIL) (2.5 MG/3ML) 0.083% nebulizer solution Take 3 mLs (2.5 mg total) by nebulization every 6 (six) hours as needed for wheezing or shortness of breath. 08/25/21   Hoy Register, MD  amiodarone (PACERONE) 200 MG tablet Take 1 tablet (200 mg total) by mouth 2 (two) times daily for 7 days, THEN 1 tablet (200 mg total) daily. 06/28/22 10/03/22  Arty Baumgartner, NP  ELIQUIS 5 MG TABS tablet Take 5 mg by mouth 2 (two) times daily. 12/09/21   [provider]  Ferrous Sulfate (IRON PO) Take 1 tablet by mouth in the morning, at noon,  and at bedtime.    [provider]  fluticasone-salmeterol (ADVAIR DISKUS) 250-50 MCG/ACT AEPB Inhale 1 puff into the lungs in the morning and at bedtime. NEEDS PASS 08/09/21   Claiborne Rigg, NP  hydrOXYzine (ATARAX) 10 MG tablet Take 1 tablet (10 mg total) by mouth 3 (three) times daily as needed. For anxiety 06/07/21   Claiborne Rigg, NP  midodrine (PROAMATINE) 5 MG tablet Take one tablet by mouth 3 times daily on non-dialysis days, Mon, Wed, Fri and Sun. 02/19/22   Sharlene Dory, PA-C  montelukast (SINGULAIR) 10 MG tablet Take 1 tablet (10 mg total) by mouth at bedtime. 06/07/21   Claiborne Rigg, NP  sildenafil (VIAGRA) 100 MG tablet Take 0.5-1 tablets (50-100 mg total) by mouth daily as needed for erectile dysfunction. 05/11/22   Claiborne Rigg, NP  VELPHORO 500 MG chewable tablet Chew 1,000 mg by mouth 2 (two) times daily after a meal. 02/06/21   [provider]  VISINE 0.05 % ophthalmic solution Place 2-3 drops into both eyes 2 (two) times daily as needed (dry eye). 07/04/21   [provider]      Allergies    Codeine, Dextromethorphan-guaifenesin, Iron dextran, and Morphine and codeine    Review of Systems   Review of Systems  Respiratory:  Negative for shortness of breath.  Cardiovascular:  Negative for chest pain.  Gastrointestinal:  Negative for abdominal pain.  Musculoskeletal:  Positive for neck pain (now gone). Negative for neck stiffness.  Neurological:  Positive for weakness (generalized). Negative for numbness and headaches.    Physical Exam Updated Vital Signs BP 113/72   Pulse 60   Temp 98.9 F (37.2 C) (Oral)   Resp 14   Ht 5\' 9"  (1.753 m)   Wt 72 kg   SpO2 98%   BMI 23.44 kg/m  Physical Exam Vitals and nursing note reviewed.  Constitutional:      General: He is not in acute distress.    Appearance: He is well-developed. He is not ill-appearing or diaphoretic.  HENT:     Head: Normocephalic and atraumatic.  Eyes:      Extraocular Movements: Extraocular movements intact.     Pupils: Pupils are equal, round, and reactive to light.  Cardiovascular:     Rate and Rhythm: Normal rate and regular rhythm.     Heart sounds: Normal heart sounds.  Pulmonary:     Effort: Pulmonary effort is normal.     Breath sounds: Normal breath sounds.  Abdominal:     General: There is no distension.     Palpations: Abdomen is soft.     Tenderness: There is no abdominal tenderness.  Musculoskeletal:     Cervical back: Normal range of motion and neck supple. No rigidity.  Skin:    General: Skin is warm and dry.  Neurological:     Mental Status: He is alert.     Comments: Chronic left sided weakness. Intact right sided strength No facial droop or slurred speech     ED Results / Procedures / Treatments   Labs (all labs ordered are listed, but only abnormal results are displayed) Labs Reviewed  BASIC METABOLIC PANEL - Abnormal; Notable for the following components:      Result Value   Potassium 5.3 (*)    Chloride 93 (*)    CO2 16 (*)    BUN 27 (*)    Creatinine, Ser 7.60 (*)    GFR, Estimated 8 (*)    Anion gap 27 (*)    All other components within normal limits  CBC WITH DIFFERENTIAL/PLATELET - Abnormal; Notable for the following components:   HCT 54.3 (*)    RDW 16.7 (*)    Platelets 72 (*)    Monocytes Absolute 1.3 (*)    All other components within normal limits  TROPONIN I (HIGH SENSITIVITY) - Abnormal; Notable for the following components:   Troponin I (High Sensitivity) 108 (*)    All other components within normal limits  LACTIC ACID, PLASMA  BETA-HYDROXYBUTYRIC ACID  ETHANOL  SALICYLATE LEVEL  TROPONIN I (HIGH SENSITIVITY)    EKG None  Radiology DG Chest Portable 1 View  Result Date: 07/14/2022 CLINICAL DATA:  Weakness EXAM: PORTABLE CHEST 1 VIEW COMPARISON:  07/19/2022 FINDINGS: Left dialysis catheter tip in the right atrium, unchanged. Cardiomegaly, vascular congestion. Interstitial  prominence, likely interstitial edema. Small right pleural effusion. No acute bony abnormality. IMPRESSION: Cardiomegaly with vascular congestion and interstitial prominence, likely interstitial edema. Electronically Signed   By: Charlett Nose M.D.   On: 07/14/2022 21:04    Procedures Procedures    Medications Ordered in ED Medications - No data to display  ED Course/ Medical Decision Making/ A&P  Medical Decision Making Amount and/or Complexity of Data Reviewed Labs: ordered.    Details: Troponin 108, slightly higher than baseline. WBC normal.  Anion gap acidosis with a bicarb of 16. Radiology: ordered and independent interpretation performed.    Details: Mild CHF, improved from before ECG/medicine tests: ordered and independent interpretation performed.    Details: No ischemia   Patient presents with an episode of not feeling well, sweating, and neck "spasms".  Here he is feeling much better.  Vitals are stable.  Troponin was sent given the sweating, his significant cardiac history, and the nonspecific neck discomfort.  Troponin is slightly elevated compared to baseline but will need to see the delta.  However his metabolic panel does show an increased anion gap acidosis from baseline with a lower bicarb than typical.  There is no evidence currently that he is ill from something such as sepsis, but will investigate further with lactate, beta hydroxybutyrate, ethanol, salicylate.  Care transferred to Dr. Adela Lank.        Final Clinical Impression(s) / ED Diagnoses Final diagnoses:  None    Rx / DC Orders ED Discharge Orders     None         Pricilla Loveless, MD 07/14/22 2312

## 2022-07-14 NOTE — ED Triage Notes (Signed)
Patient BIB EMS from home due to weakness. Patient was dialyzed today and was sent home. BP was soft with EMS. In room BP 116/72. Patient denies pain as well as N/V/D. Patient A&Ox4.

## 2022-07-15 LAB — ETHANOL: Alcohol, Ethyl (B): <10 mg/dL (ref <10)

## 2022-07-15 LAB — SALICYLATE LEVEL: Salicylate Lvl: <7 mg/dL — ABNORMAL LOW (ref 7.0–30.0)

## 2022-07-15 LAB — BETA-HYDROXYBUTYRIC ACID: Beta-Hydroxybutyric Acid: 0.5 mmol/L — ABNORMAL HIGH (ref 0.05–0.27)

## 2022-07-15 LAB — LACTIC ACID, PLASMA: Lactic Acid, Venous: 2.1 mmol/L (ref 0.5–1.9)

## 2022-07-15 MED ORDER — SODIUM CHLORIDE 0.9 % IV BOLUS
250.0000 mL | Freq: Once | INTRAVENOUS | Status: AC
  Administered 2022-07-15: 250 mL via INTRAVENOUS

## 2022-07-15 NOTE — Discharge Instructions (Signed)
Please call your cardiologist tomorrow and let them know about your visit to the emergency department.  See when they want to see you again or if they want to more urgently hook you up with a cardiac monitor.

## 2022-07-18 ENCOUNTER — Ambulatory Visit: Payer: Medicare Other | Attending: Nurse Practitioner

## 2022-07-18 VITALS — Ht 69.0 in | Wt 158.0 lb

## 2022-07-18 DIAGNOSIS — Z0000 Encounter for general adult medical examination without abnormal findings: Secondary | ICD-10-CM | POA: Diagnosis not present

## 2022-07-18 NOTE — Progress Notes (Signed)
Subjective:   Nathaniel White is a 62 y.o. male who presents for Medicare Annual/Subsequent preventive examination.  Visit Complete: Virtual  I connected with  [name] on 07/18/22 by a audio enabled telemedicine application and verified that I am speaking with the correct person using two identifiers.  Patient Location: Home  Provider Location: Home Office  I discussed the limitations of evaluation and management by telemedicine. The patient expressed understanding and agreed to proceed.  Review of Systems     Cardiac Risk Factors include: advanced age (>42men, >32 women);diabetes mellitus;dyslipidemia;hypertension;male gender     Objective:    Today's Vitals   07/18/22 1352  Weight: 158 lb (71.7 kg)  Height: 5\' 9"  (1.753 m)   Body mass index is 23.33 kg/m.     07/18/2022    1:55 PM 07/14/2022    8:08 PM 06/24/2022   11:37 AM 06/20/2022    8:36 PM 06/19/2022   10:53 PM 06/14/2022   12:02 PM 02/24/2022    3:53 PM  Advanced Directives  Does Patient Have a Medical Advance Directive? Yes Yes No No No No No  Type of Estate agent of [place];Living will Healthcare Power of Attorney       Does patient want to make changes to medical advance directive? No - Patient declined        Copy of Healthcare Power of Attorney in Chart? Yes - validated most recent copy scanned in chart (See row information)        Would patient like information on creating a medical advance directive? No - Patient declined  No - Patient declined No - Patient declined       Current Medications (verified) Outpatient Encounter Medications as of 07/18/2022  Medication Sig   acetaminophen (TYLENOL) 500 MG tablet Take 1 tablet (500 mg total) by mouth every 6 (six) hours as needed.   albuterol (PROAIR HFA) 108 (90 Base) MCG/ACT inhaler Inhale 2 puffs into the lungs every 6 (six) hours as needed for wheezing or shortness of breath.   albuterol (PROVENTIL) (2.5 MG/3ML) 0.083% nebulizer  solution Take 3 mLs (2.5 mg total) by nebulization every 6 (six) hours as needed for wheezing or shortness of breath.   amiodarone (PACERONE) 200 MG tablet Take 1 tablet (200 mg total) by mouth 2 (two) times daily for 7 days, THEN 1 tablet (200 mg total) daily.   ELIQUIS 5 MG TABS tablet Take 5 mg by mouth 2 (two) times daily.   Ferrous Sulfate (IRON PO) Take 1 tablet by mouth in the morning, at noon, and at bedtime.   fluticasone-salmeterol (ADVAIR DISKUS) 250-50 MCG/ACT AEPB Inhale 1 puff into the lungs in the morning and at bedtime. NEEDS PASS   hydrOXYzine (ATARAX) 10 MG tablet Take 1 tablet (10 mg total) by mouth 3 (three) times daily as needed. For anxiety   midodrine (PROAMATINE) 5 MG tablet Take one tablet by mouth 3 times daily on non-dialysis days, Mon, Wed, Fri and Sun.   montelukast (SINGULAIR) 10 MG tablet Take 1 tablet (10 mg total) by mouth at bedtime.   sildenafil (VIAGRA) 100 MG tablet Take 0.5-1 tablets (50-100 mg total) by mouth daily as needed for erectile dysfunction.   VELPHORO 500 MG chewable tablet Chew 1,000 mg by mouth 2 (two) times daily after a meal.   VISINE 0.05 % ophthalmic solution Place 2-3 drops into both eyes 2 (two) times daily as needed (dry eye).   No facility-administered encounter medications on file  as of 07/18/2022.    Allergies (verified) Codeine, Dextromethorphan-guaifenesin, Iron dextran, and Morphine and codeine   History: Past Medical History:  Diagnosis Date   Anemia    Anxiety    Asthma    Complication from renal dialysis device 11/02/2013   Complication of anesthesia 2017   according to pt and spouse pt was moving around and cough while under and pt had difficulty waking up so the anesthesia had to be reversed. and pt admitted to ICU.    COPD (chronic obstructive pulmonary disease) (HCC)    Diabetes mellitus without complication (HCC)    Type II - Patient states he does not have diabetes, "they said sometimes when you start dialysis you  don't have diabetes any longer"    ESOPHAGEAL VARICES 10/04/2008   Qualifier: Diagnosis of  By: Russella Dar MD Marylu Lund    ESRD    on HD, T-TH-Sat - [place]   Hemiparesis due to old stroke [facility])    left   HFrEF (heart failure with reduced ejection fraction) (HCC)    NICM // TTE 06/2022: EF 25-30, sever RV dysfunction, mod to severe MR   Hypertension    Hx, not current problems, no meds   LV dysfunction    EF 25-30% by echo 07/2011   Memory loss due to medical condition    due to stroke   MR (mitral regurgitation)    moderate to severe, echo 07/2011   Nausea with vomiting, unspecified 06/15/2021   Paroxysmal atrial fibrillation (HCC)    Peripheral vascular disease (HCC)    Shortness of breath    with exertion   Stroke (HCC)    TIA's-left sided weakness   Tobacco abuse    Past Surgical History:  Procedure Laterality Date   ABDOMINAL AORTOGRAM W/LOWER EXTREMITY Bilateral 07/21/2018   Procedure: ABDOMINAL AORTOGRAM W/LOWER EXTREMITY;  Surgeon: Maeola Harman, MD;  Location: [facility] INVASIVE CV LAB;  Service: Cardiovascular;  Laterality: Bilateral;   AMPUTATION Left 11/05/2018   Procedure: AMPUTATION SECOND TOE LEFT FOOT;  Surgeon: Maeola Harman, MD;  Location: [facility] OR;  Service: Vascular;  Laterality: Left;   AMPUTATION Left 02/13/2021   Procedure: Left small finger AMPUTATION DIGIT;  Surgeon: Marlyne Beards, MD;  Location: [facility] OR;  Service: Orthopedics;  Laterality: Left;   AV FISTULA PLACEMENT     CARDIAC CATHETERIZATION     [facility]   COLONOSCOPY W/ BIOPSIES AND POLYPECTOMY     FISTULA SUPERFICIALIZATION Left 11/10/2013   Procedure: FISTULA PLICATION;  Surgeon: Nada Libman, MD;  Location: [facility] OR;  Service: Vascular;  Laterality: Left;   INSERTION OF DIALYSIS CATHETER N/A 06/20/2021   Procedure: INSERTION OF DIALYSIS CATHETER;  Surgeon: Victorino Sparrow, MD;  Location: [facility] OR;  Service: Vascular;  Laterality: N/A;   KIDNEY TRANSPLANT     2011 rejected  kidney 2012 back on dialysis   LEFT AND RIGHT HEART CATHETERIZATION WITH CORONARY ANGIOGRAM N/A 10/09/2013   Procedure: LEFT AND RIGHT HEART CATHETERIZATION WITH CORONARY ANGIOGRAM;  Surgeon: Kathleene Hazel, MD;  Location: [facility] CATH LAB;  Service: Cardiovascular;  Laterality: N/A;   LIGATION OF ARTERIOVENOUS  FISTULA Left 06/20/2021   Procedure: LIGATION OF ARTERIOVENOUS  FISTULA;  Surgeon: Victorino Sparrow, MD;  Location: [facility] OR;  Service: Vascular;  Laterality: Left;   PERIPHERAL VASCULAR INTERVENTION Left 07/21/2018   Procedure: PERIPHERAL VASCULAR INTERVENTION;  Surgeon: Maeola Harman, MD;  Location: [facility] INVASIVE CV LAB;  Service: Cardiovascular;  Laterality: Left;  Popliteal  REVISON OF ARTERIOVENOUS FISTULA Left 08/26/2013   Procedure: EXCISION OF ERODED SKIN AND EXPLORATION OF MAIN LEFT UPPER ARM AV FISTULA;  Surgeon: Larina Earthly, MD;  Location: [facility] OR;  Service: Vascular;  Laterality: Left;   REVISON OF ARTERIOVENOUS FISTULA Left 02/05/2014   Procedure: REPAIR OF ARTERIOVENOUS FISTULA ANEURYSM;  Surgeon: Nada Libman, MD;  Location: [facility] OR;  Service: Vascular;  Laterality: Left;   REVISON OF ARTERIOVENOUS FISTULA Left 03/10/2021   Procedure: LEFT ARM REVISION OF ARTERIOVENOUS FISTULA WITH PLICATION;  Surgeon: Maeola Harman, MD;  Location: [facility] OR;  Service: Vascular;  Laterality: Left;   RIGHT/LEFT HEART CATH AND CORONARY ANGIOGRAPHY N/A 06/21/2022   Procedure: RIGHT/LEFT HEART CATH AND CORONARY ANGIOGRAPHY;  Surgeon: Lennette Bihari, MD;  Location: [facility] INVASIVE CV LAB;  Service: Cardiovascular;  Laterality: N/A;   SHUNTOGRAM N/A 11/05/2012   Procedure: Fistulogram;  Surgeon: Nada Libman, MD;  Location: [facility] CATH LAB;  Service: Cardiovascular;  Laterality: N/A;   TEE WITHOUT CARDIOVERSION  08/17/2011   Procedure: TRANSESOPHAGEAL ECHOCARDIOGRAM (TEE);  Surgeon: Laurey Morale, MD;  Location: [facility] ENDOSCOPY;  Service: Cardiovascular;  Laterality: N/A;   ULTRASOUND GUIDANCE  FOR VASCULAR ACCESS  06/20/2021   Procedure: ULTRASOUND GUIDANCE FOR VASCULAR ACCESS;  Surgeon: Victorino Sparrow, MD;  Location: [facility] OR;  Service: Vascular;;   VIDEO ASSISTED THORACOSCOPY (VATS)/DECORTICATION  08/10/11   Family History  Problem Relation Age of Onset   Hypertension Mother    Varicose Veins Mother    Diabetes Paternal Grandmother    Cancer Paternal Grandfather    CAD Paternal Uncle    Social History   Socioeconomic History   Marital status: Single    Spouse name: Not on file   Number of children: 1   Years of education: Not on file   Highest education level: Not on file  Occupational History   Occupation: Nurse, adult and Public librarian: DISABLED  Tobacco Use   Smoking status: Former    Packs/day: 0.50    Years: 15.00    Additional pack years: 0.00    Total pack years: 7.50    Types: Cigarettes    Quit date: 2014    Years since quitting: 10.5   Smokeless tobacco: Never  Vaping Use   Vaping Use: Never used  Substance and Sexual Activity   Alcohol use: No    Alcohol/week: 0.0 standard drinks of alcohol   Drug use: No   Sexual activity: Not Currently  Other Topics Concern   Not on file  Social History Narrative   Not on file   Social Determinants of Health   Financial Resource Strain: Low Risk  (07/18/2022)   Overall Financial Resource Strain (CARDIA)    Difficulty of Paying Living Expenses: Not hard at all  Food Insecurity: No Food Insecurity (07/18/2022)   Hunger Vital Sign    Worried About Running Out of Food in the Last Year: Never true    Ran Out of Food in the Last Year: Never true  Transportation Needs: No Transportation Needs (07/18/2022)   PRAPARE - Administrator, Civil Service (Medical): No    Lack of Transportation (Non-Medical): No  Physical Activity: Inactive (07/18/2022)   Exercise Vital Sign    Days of Exercise per Week: 0 days    Minutes of Exercise per Session: 0 min  Stress: No Stress Concern Present (07/18/2022)    [employer] of Occupational Health - Occupational Stress Questionnaire    Feeling of  Stress : Only a little  Social Connections: Moderately Isolated (07/18/2022)   Social Connection and Isolation Panel [NHANES]    Frequency of Communication with Friends and Family: More than three times a week    Frequency of Social Gatherings with Friends and Family: Three times a week    Attends Religious Services: 1 to 4 times per year    Active Member of Clubs or Organizations: No    Attends Banker Meetings: Never    Marital Status: Never married    Tobacco Counseling Counseling given: Not Answered   Clinical Intake:  Pre-visit preparation completed: Yes  Pain : No/denies pain     Diabetes: Yes CBG done?: No Did pt. bring in CBG monitor from home?: No  How often do you need to have someone help you when you read instructions, pamphlets, or other written materials from your doctor or pharmacy?: 1 - Never  Interpreter Needed?: No  Information entered by :: Kandis Fantasia LPN   Activities of Daily Living    07/18/2022    1:53 PM 06/20/2022    8:35 PM  In your present state of health, do you have any difficulty performing the following activities:  Hearing? 0   Vision? 0   Difficulty concentrating or making decisions? 0   Walking or climbing stairs? 1   Dressing or bathing? 0   Doing errands, shopping? 1 1  Preparing Food and eating ? N   Using the Toilet? N   In the past six months, have you accidently leaked urine? N   Do you have problems with loss of bowel control? N   Managing your Medications? N   Managing your Finances? N   Housekeeping or managing your Housekeeping? N     Patient Care Team: Claiborne Rigg, NP as PCP - General (Nurse Practitioner) Kathleene Hazel, MD as PCP - Cardiology (Cardiology) York Spaniel, MD (Inactive) (Neurology) Annie Sable, MD as Attending Physician (Nephrology) Lovett Sox, MD as Attending  Physician (Cardiothoracic Surgery) Primitivo Gauze, MD as Consulting Physician (Nephrology) [facility]  Indicate any recent Medical Services you may have received from other than [facility] providers in the past year (date may be approximate).     Assessment:   This is a routine wellness examination for [place].  Hearing/Vision screen Hearing Screening - Comments:: Denies hearing difficulties   Vision Screening - Comments:: No vision problems; will schedule routine eye exam soon    Dietary issues and exercise activities discussed:     Goals Addressed             This Visit's Progress    Keep appointments with providers        Depression Screen    07/18/2022    1:56 PM 02/09/2022   11:23 AM 08/09/2021    2:53 PM 06/07/2021    3:52 PM 01/11/2021    4:09 PM 12/27/2020    3:58 PM 09/01/2018    4:09 PM  PHQ 2/9 Scores  PHQ - 2 Score 0  0 0 1 0 0  PHQ- 9 Score   8 1 8 4    Exception Documentation  Patient refusal         Fall Risk    07/18/2022    1:55 PM 05/11/2022   11:23 AM 02/09/2022   11:20 AM 08/09/2021    2:43 PM 06/07/2021    3:52 PM  Fall Risk   Falls in the past year? 0 0 0 0 0  Number falls in past yr: 0 0 0 0 0  Injury with Fall? 0 0 0 0 0  Risk for fall due to : Impaired mobility No Fall Risks No Fall Risks    Follow up Falls prevention discussed;Education provided;Falls evaluation completed Falls evaluation completed Falls evaluation completed      MEDICARE RISK AT HOME:  Medicare Risk at Home - 07/18/22 1355     Any stairs in or around the home? No    If so, are there any without handrails? No    Home free of loose throw rugs in walkways, pet beds, electrical cords, etc? Yes    Adequate lighting in your home to reduce risk of falls? Yes    Life alert? No    Use of a cane, walker or w/c? Yes    Grab bars in the bathroom? Yes    Shower chair or bench in shower? No    Elevated toilet seat or a handicapped toilet? No             TIMED  UP AND GO:  Was the test performed?  No    Cognitive Function:        07/18/2022    1:55 PM  6CIT Screen  What Year? 0 points  What month? 0 points  What time? 0 points  Count back from 20 0 points  Months in reverse 0 points  Repeat phrase 0 points  Total Score 0 points    Immunizations Immunization History  Administered Date(s) Administered   Influenza, Quadrivalent, Recombinant, Inj, Pf 10/17/2021   Influenza,inj,Quad PF,6+ Mos 10/02/2018, 10/08/2019, 10/04/2020   Influenza,inj,quad, With Preservative 10/03/2017   PFIZER(Purple Top)SARS-COV-2 Vaccination 09/24/2019   PNEUMOCOCCAL CONJUGATE-20 06/26/2021   Pneumococcal Conjugate-13 12/11/2018   Pneumococcal Polysaccharide-23 07/02/2015, 08/23/2020   Tdap 05/11/2022    TDAP status: Up to date  Pneumococcal vaccine status: Up to date  Covid-19 vaccine status: Information provided on how to obtain vaccines.   Qualifies for Shingles Vaccine? Yes   Zostavax completed No   Shingrix Completed?: No.    Education has been provided regarding the importance of this vaccine. Patient has been advised to call insurance company to determine out of pocket expense if they have not yet received this vaccine. Advised may also receive vaccine at local pharmacy or Health Dept. Verbalized acceptance and understanding.  Screening Tests Health Maintenance  Topic Date Due   FOOT EXAM  Never done   OPHTHALMOLOGY EXAM  Never done   Zoster Vaccines- Shingrix (1 of 2) Never done   Colonoscopy  10/14/2018   COVID-19 Vaccine (2 - Pfizer risk series) 10/15/2019   HEMOGLOBIN A1C  03/08/2022   INFLUENZA VACCINE  08/09/2022   Medicare Annual Wellness (AWV)  07/18/2023   DTaP/Tdap/Td (2 - Td or Tdap) 05/10/2032   Hepatitis C Screening  Completed   HIV Screening  Completed   HPV VACCINES  Aged Out    Health Maintenance  Health Maintenance Due  Topic Date Due   FOOT EXAM  Never done   OPHTHALMOLOGY EXAM  Never done   Zoster Vaccines-  Shingrix (1 of 2) Never done   Colonoscopy  10/14/2018   COVID-19 Vaccine (2 - Pfizer risk series) 10/15/2019   HEMOGLOBIN A1C  03/08/2022   Colorectal cancer screening:  Cologuard ordered 02/09/22  Lung Cancer Screening: (Low Dose CT Chest recommended if Age 11-80 years, 20 pack-year currently smoking OR have quit w/in 15years.) does not qualify.   Lung Cancer Screening Referral: n/a  Additional Screening:  Hepatitis C Screening: does qualify; Completed 09/06/21  Vision Screening: Recommended annual ophthalmology exams for early detection of glaucoma and other disorders of the eye. Is the patient up to date with their annual eye exam?  No  Who is the provider or what is the name of the office in which the patient attends annual eye exams? none If pt is not established with a provider, would they like to be referred to a provider to establish care? No .   Dental Screening: Recommended annual dental exams for proper oral hygiene  Diabetic Foot Exam: Diabetic Foot Exam: Overdue, Pt has been advised about the importance in completing this exam. Pt is scheduled for diabetic foot exam on at next office visit.  Community Resource Referral / Chronic Care Management: CRR required this visit?  No   CCM required this visit?  No     Plan:     I have personally reviewed and noted the following in the patient's chart:   Medical and social history Use of alcohol, tobacco or illicit drugs  Current medications and supplements including opioid prescriptions. Patient is not currently taking opioid prescriptions. Functional ability and status Nutritional status Physical activity Advanced directives List of other physicians Hospitalizations, surgeries, and ER visits in previous 12 months Vitals Screenings to include cognitive, depression, and falls Referrals and appointments  In addition, I have reviewed and discussed with patient certain preventive protocols, quality metrics, and best  practice recommendations. A written personalized care plan for preventive services as well as general preventive health recommendations were provided to patient.     Kandis Fantasia [place], [place]   [id number]   After Visit Summary: (Mail) Due to this being a telephonic visit, the after visit summary with patients personalized plan was offered to patient via mail   Nurse Notes: See telephone note

## 2022-07-18 NOTE — Patient Instructions (Addendum)
Nathaniel White , Thank you for taking time to come for your Medicare Wellness Visit. I appreciate your ongoing commitment to your health goals. Please review the following plan we discussed and let me know if I can assist you in the future.   These are the goals we discussed:  Goals      Keep appointments with providers        This is a list of the screening recommended for you and due dates:  Health Maintenance  Topic Date Due   Complete foot exam   Never done   Eye exam for diabetics  Never done   Zoster (Shingles) Vaccine (1 of 2) Never done   Colon Cancer Screening  10/14/2018   COVID-19 Vaccine (2 - Pfizer risk series) 10/15/2019   Hemoglobin A1C  03/08/2022   Flu Shot  08/09/2022   Medicare Annual Wellness Visit  07/18/2023   DTaP/Tdap/Td vaccine (2 - Td or Tdap) 05/10/2032   Hepatitis C Screening  Completed   HIV Screening  Completed   HPV Vaccine  Aged Out    Advanced directives: We have a copy of your advanced directives available in your record should your provider ever need to access them.  Conditions/risks identified: Aim for 30 minutes of exercise or brisk walking, 6-8 glasses of water, and 5 servings of fruits and vegetables each day.  Next appointment: Follow up in one year for your annual wellness visit   Preventive Care 40-64 Years, Male Preventive care refers to lifestyle choices and visits with your health care provider that can promote health and wellness. What does preventive care include? A yearly physical exam. This is also called an annual well check. Dental exams once or twice a year. Routine eye exams. Ask your health care provider how often you should have your eyes checked. Personal lifestyle choices, including: Daily care of your teeth and gums. Regular physical activity. Eating a healthy diet. Avoiding tobacco and drug use. Limiting alcohol use. Practicing safe sex. Taking low-dose aspirin every day starting at age 73. What happens during an  annual well check? The services and screenings done by your health care provider during your annual well check will depend on your age, overall health, lifestyle risk factors, and family history of disease. Counseling  Your health care provider may ask you questions about your: Alcohol use. Tobacco use. Drug use. Emotional well-being. Home and relationship well-being. Sexual activity. Eating habits. Work and work Astronomer. Screening  You may have the following tests or measurements: Height, weight, and BMI. Blood pressure. Lipid and cholesterol levels. These may be checked every 5 years, or more frequently if you are over 56 years old. Skin check. Lung cancer screening. You may have this screening every year starting at age 31 if you have a 30-pack-year history of smoking and currently smoke or have quit within the past 15 years. Fecal occult blood test (FOBT) of the stool. You may have this test every year starting at age 52. Flexible sigmoidoscopy or colonoscopy. You may have a sigmoidoscopy every 5 years or a colonoscopy every 10 years starting at age 53. Prostate cancer screening. Recommendations will vary depending on your family history and other risks. Hepatitis C blood test. Hepatitis B blood test. Sexually transmitted disease (STD) testing. Diabetes screening. This is done by checking your blood sugar (glucose) after you have not eaten for a while (fasting). You may have this done every 1-3 years. Discuss your test results, treatment options, and if necessary, the need  for more tests with your health care provider. Vaccines  Your health care provider may recommend certain vaccines, such as: Influenza vaccine. This is recommended every year. Tetanus, diphtheria, and acellular pertussis (Tdap, Td) vaccine. You may need a Td booster every 10 years. Zoster vaccine. You may need this after age 32. Pneumococcal 13-valent conjugate (PCV13) vaccine. You may need this if you have  certain conditions and have not been vaccinated. Pneumococcal polysaccharide (PPSV23) vaccine. You may need one or two doses if you smoke cigarettes or if you have certain conditions. Talk to your health care provider about which screenings and vaccines you need and how often you need them. This information is not intended to replace advice given to you by your health care provider. Make sure you discuss any questions you have with your health care provider. Document Released: 01/21/2015 Document Revised: 09/14/2015 Document Reviewed: 10/26/2014 Elsevier Interactive Patient Education  2017 [employer].  Fall Prevention in the Home Falls can cause injuries. They can happen to people of all ages. There are many things you can do to make your home safe and to help prevent falls. What can I do on the outside of my home? Regularly fix the edges of walkways and driveways and fix any cracks. Remove anything that might make you trip as you walk through a door, such as a raised step or threshold. Trim any bushes or trees on the path to your home. Use bright outdoor lighting. Clear any walking paths of anything that might make someone trip, such as rocks or tools. Regularly check to see if handrails are loose or broken. Make sure that both sides of any steps have handrails. Any raised decks and porches should have guardrails on the edges. Have any leaves, snow, or ice cleared regularly. Use sand or salt on walking paths during winter. Clean up any spills in your garage right away. This includes oil or grease spills. What can I do in the bathroom? Use night lights. Install grab bars by the toilet and in the tub and shower. Do not use towel bars as grab bars. Use non-skid mats or decals in the tub or shower. If you need to sit down in the shower, use a plastic, non-slip stool. Keep the floor dry. Clean up any water that spills on the floor as soon as it happens. Remove soap buildup in the tub or  shower regularly. Attach bath mats securely with double-sided non-slip rug tape. Do not have throw rugs and other things on the floor that can make you trip. What can I do in the bedroom? Use night lights. Make sure that you have a light by your bed that is easy to reach. Do not use any sheets or blankets that are too big for your bed. They should not hang down onto the floor. Have a firm chair that has side arms. You can use this for support while you get dressed. Do not have throw rugs and other things on the floor that can make you trip. What can I do in the kitchen? Clean up any spills right away. Avoid walking on wet floors. Keep items that you use a lot in easy-to-reach places. If you need to reach something above you, use a strong step stool that has a grab bar. Keep electrical cords out of the way. Do not use floor polish or wax that makes floors slippery. If you must use wax, use non-skid floor wax. Do not have throw rugs and other things on  the floor that can make you trip. What can I do with my stairs? Do not leave any items on the stairs. Make sure that there are handrails on both sides of the stairs and use them. Fix handrails that are broken or loose. Make sure that handrails are as long as the stairways. Check any carpeting to make sure that it is firmly attached to the stairs. Fix any carpet that is loose or worn. Avoid having throw rugs at the top or bottom of the stairs. If you do have throw rugs, attach them to the floor with carpet tape. Make sure that you have a light switch at the top of the stairs and the bottom of the stairs. If you do not have them, ask someone to add them for you. What else can I do to help prevent falls? Wear shoes that: Do not have high heels. Have rubber bottoms. Are comfortable and fit you well. Are closed at the toe. Do not wear sandals. If you use a stepladder: Make sure that it is fully opened. Do not climb a closed stepladder. Make  sure that both sides of the stepladder are locked into place. Ask someone to hold it for you, if possible. Clearly mark and make sure that you can see: Any grab bars or handrails. First and last steps. Where the edge of each step is. Use tools that help you move around (mobility aids) if they are needed. These include: Canes. Walkers. Scooters. Crutches. Turn on the lights when you go into a dark area. Replace any light bulbs as soon as they burn out. Set up your furniture so you have a clear path. Avoid moving your furniture around. If any of your floors are uneven, fix them. If there are any pets around you, be aware of where they are. Review your medicines with your doctor. Some medicines can make you feel dizzy. This can increase your chance of falling. Ask your doctor what other things that you can do to help prevent falls. This information is not intended to replace advice given to you by your health care provider. Make sure you discuss any questions you have with your health care provider. Document Released: 10/21/2008 Document Revised: 06/02/2015 Document Reviewed: 01/29/2014 Elsevier Interactive Patient Education  2017 [employer].

## 2022-08-12 NOTE — Progress Notes (Deleted)
  Electrophysiology Office Note:    Date:  08/12/2022   ID:  Nathaniel White, DOB 11-Dec-1960, MRN [id number]  [facility] Cardiologist:  Verne Carrow, MD  [facility] Electrophysiologist:  Lanier Prude, MD   Referring MD: Beatrice Lecher, PA-C   Chief Complaint: Atrial fibrillation  History of Present Illness:    Nathaniel White is a 62 y.o. malewho I am seeing today for an evaluation of atrial fibrillation at the request of [name].  The patient was last seen by [name] on July 03, 2022.  The patient has a medical history that includes chronic systolic heart failure,. In June 2024 he was admitted to the hospital with cardiogenic shock.  His shock was complicated by ventricular tachycardia requiring treatment with amiodarone.  He was not felt to be a candidate for advanced therapies and palliative care was consulted.  He was started on dialysis for volume management.  When he saw [name], his ventricular tachycardia was quiescent on amiodarone.  He has a DO NOT RESUSCITATE order in place.  He is on Eliquis for stroke prophylaxis.  His atrial fibrillation has been controlled on amiodarone.         Their past medical, social and family history was reveiwed.   ROS:   Please see the history of present illness.    All other systems reviewed and are negative.  EKGs/Labs/Other Studies Reviewed:    The following studies were reviewed today:  June 21, 2022 echo EF 25 to 30% RV severely reduced Severely dilated left atrium Severe MR       Physical Exam:    VS:  There were no vitals taken for this visit.    Wt Readings from Last 3 Encounters:  07/18/22 158 lb (71.7 kg)  07/14/22 158 lb 11.7 oz (72 kg)  07/03/22 160 lb 3.2 oz (72.7 kg)     GEN: *** Well nourished, well developed in no acute distress CARDIAC: ***RRR, no murmurs, rubs, gallops RESPIRATORY:  Clear to auscultation without rales, wheezing or rhonchi       ASSESSMENT AND PLAN:    1.  HFrEF (heart failure with reduced ejection fraction) (HCC)   2. VT (ventricular tachycardia) (HCC)   3. Atrial fibrillation, unspecified type (HCC)   4. End-stage renal disease on hemodialysis (HCC)   5. Encounter for long-term (current) use of high-risk medication     #Chronic systolic heart failure End-stage.  Not a candidate for advanced therapies.  DNR. Complicated by ventricular tachycardia and atrial fibrillation. Rhythm control indicated.  Continue amiodarone.  Not able to tolerate GDMT.  Continue midodrine.  #Ventricular tachycardia #High risk med monitoring-amiodarone Continue amiodarone 200 mg by mouth once daily LFTs and thyroid function stable in June.   Follow-up with the EP APP in 6 months.   Signed, Rossie Muskrat. Lalla Brothers, MD, [facility], [facility] 08/12/2022 9:35 PM    Electrophysiology [facility]

## 2022-08-13 ENCOUNTER — Ambulatory Visit: Payer: Medicare Other | Admitting: Cardiology

## 2022-08-13 DIAGNOSIS — N186 End stage renal disease: Secondary | ICD-10-CM

## 2022-08-13 DIAGNOSIS — I5020 Unspecified systolic (congestive) heart failure: Secondary | ICD-10-CM

## 2022-08-13 DIAGNOSIS — I4891 Unspecified atrial fibrillation: Secondary | ICD-10-CM

## 2022-08-13 DIAGNOSIS — Z79899 Other long term (current) drug therapy: Secondary | ICD-10-CM

## 2022-08-13 DIAGNOSIS — I472 Ventricular tachycardia, unspecified: Secondary | ICD-10-CM

## 2022-08-17 ENCOUNTER — Ambulatory Visit: Payer: Medicare Other | Admitting: Nurse Practitioner

## 2022-08-20 DIAGNOSIS — Z111 Encounter for screening for respiratory tuberculosis: Secondary | ICD-10-CM | POA: Insufficient documentation

## 2022-08-25 ENCOUNTER — Other Ambulatory Visit: Payer: Self-pay

## 2022-08-25 ENCOUNTER — Emergency Department (HOSPITAL_COMMUNITY)
Admission: EM | Admit: 2022-08-25 | Discharge: 2022-08-25 | Disposition: A | Discharge status: Home / Self Care | Payer: Medicare Other | Attending: Emergency Medicine | Admitting: Emergency Medicine

## 2022-08-25 ENCOUNTER — Emergency Department (HOSPITAL_COMMUNITY): Payer: Medicare Other

## 2022-08-25 ENCOUNTER — Encounter (HOSPITAL_COMMUNITY): Payer: Self-pay

## 2022-08-25 DIAGNOSIS — S8392XA Sprain of unspecified site of left knee, initial encounter: Secondary | ICD-10-CM

## 2022-08-25 DIAGNOSIS — X501XXA Overexertion from prolonged static or awkward postures, initial encounter: Secondary | ICD-10-CM | POA: Diagnosis not present

## 2022-08-25 DIAGNOSIS — Z7901 Long term (current) use of anticoagulants: Secondary | ICD-10-CM | POA: Diagnosis not present

## 2022-08-25 DIAGNOSIS — M25562 Pain in left knee: Secondary | ICD-10-CM | POA: Diagnosis present

## 2022-08-25 MED ORDER — ACETAMINOPHEN 500 MG PO TABS
1000.0000 mg | ORAL_TABLET | Freq: Once | ORAL | Status: AC
  Administered 2022-08-25: 1000 mg via ORAL
  Filled 2022-08-25: qty 2

## 2022-08-25 NOTE — ED Provider Notes (Signed)
[place] EMERGENCY DEPARTMENT AT [facility] Provider Note   CSN: [id number] Arrival date & time: 08/25/22  0845     History  Chief Complaint  Patient presents with   Leg Injury    Nathaniel White is a 62 y.o. male.  HPI 2 days ago the patient reports he was trying to get into a car.  He reports the wait was parked he had to approach from a little bit of an incline to get down into the car and ended up losing his balance and twisting the left knee really hard.  He reports it has been painful since the injury.  Is been painful to put weight on it and most of the pain is to the inside of the knee.  He reports he has been using a walker to assist in getting around.  Patient is due for dialysis today but came to the emergency department due to his knee injury.    Home Medications Prior to Admission medications   Medication Sig Start Date End Date Taking? Authorizing Provider  acetaminophen (TYLENOL) 500 MG tablet Take 1 tablet (500 mg total) by mouth every 6 (six) hours as needed. 04/11/18   Georgetta Haber, NP  albuterol (PROAIR HFA) 108 (90 Base) MCG/ACT inhaler Inhale 2 puffs into the lungs every 6 (six) hours as needed for wheezing or shortness of breath. 08/25/21   Hoy Register, MD  albuterol (PROVENTIL) (2.5 MG/3ML) 0.083% nebulizer solution Take 3 mLs (2.5 mg total) by nebulization every 6 (six) hours as needed for wheezing or shortness of breath. 08/25/21   Hoy Register, MD  amiodarone (PACERONE) 200 MG tablet Take 1 tablet (200 mg total) by mouth 2 (two) times daily for 7 days, THEN 1 tablet (200 mg total) daily. 06/28/22 10/03/22  Arty Baumgartner, NP  ELIQUIS 5 MG TABS tablet Take 5 mg by mouth 2 (two) times daily. 12/09/21   [provider]  Ferrous Sulfate (IRON PO) Take 1 tablet by mouth in the morning, at noon, and at bedtime.    [provider]  fluticasone-salmeterol (ADVAIR DISKUS) 250-50 MCG/ACT AEPB Inhale 1 puff into the lungs in the  morning and at bedtime. NEEDS PASS 08/09/21   Claiborne Rigg, NP  hydrOXYzine (ATARAX) 10 MG tablet Take 1 tablet (10 mg total) by mouth 3 (three) times daily as needed. For anxiety 06/07/21   Claiborne Rigg, NP  midodrine (PROAMATINE) 5 MG tablet Take one tablet by mouth 3 times daily on non-dialysis days, Mon, Wed, Fri and Sun. 02/19/22   Sharlene Dory, PA-C  montelukast (SINGULAIR) 10 MG tablet Take 1 tablet (10 mg total) by mouth at bedtime. 06/07/21   Claiborne Rigg, NP  sildenafil (VIAGRA) 100 MG tablet Take 0.5-1 tablets (50-100 mg total) by mouth daily as needed for erectile dysfunction. 05/11/22   Claiborne Rigg, NP  VELPHORO 500 MG chewable tablet Chew 1,000 mg by mouth 2 (two) times daily after a meal. 02/06/21   [provider]  VISINE 0.05 % ophthalmic solution Place 2-3 drops into both eyes 2 (two) times daily as needed (dry eye). 07/04/21   [provider]      Allergies    Codeine, Dextromethorphan-guaifenesin, Iron dextran, and Morphine and codeine    Review of Systems   Review of Systems  Physical Exam Updated Vital Signs BP 113/71   Pulse 60   Temp 97.7 F (36.5 C)   Resp 15   Ht 5'  9" (1.753 m)   Wt 74.8 kg   SpO2 98%   BMI 24.37 kg/m  Physical Exam Constitutional:      Comments: Alert nontoxic.  No acute distress.  Cardiovascular:     Rate and Rhythm: Normal rate and regular rhythm.  Pulmonary:     Effort: Pulmonary effort is normal.     Breath sounds: Normal breath sounds.  Abdominal:     General: There is no distension.     Palpations: Abdomen is soft.     Tenderness: There is no abdominal tenderness.  Musculoskeletal:     Comments: Left knee is held in a slightly flexed position and externally rotated.  Patient is focal pain is to the medial aspect of the upper tibial surface.  Questionable small amount of joint effusion.  No erythema or warmth.  Patient experiences more pain with complete extension of the knee.  He does however  qualify that due to old stroke the knee does stay somewhat more flexed at baseline.  He denies any pain to palpation of the lower leg and no calf tenderness.  No warmth or acute skin lesions to the lower leg.  Patient does have chronically thinned, dry severely scaling skin of the lower legs and feet, there is no edema of the feet but severe chronic scaling and thick elongated toenails.  1 digit is missing it appears to be the fourth digit.  No acute appearance.  Skin:    General: Skin is warm and dry.  Neurological:     General: No focal deficit present.     Mental Status: He is oriented to person, place, and time.     ED Results / Procedures / Treatments   Labs (all labs ordered are listed, but only abnormal results are displayed) Labs Reviewed - No data to display  EKG None  Radiology DG Knee Complete 4 Views Left  Result Date: 08/25/2022 CLINICAL DATA:  Fall with left knee pain. EXAM: LEFT KNEE - COMPLETE 4+ VIEW COMPARISON:  04/13/2011 FINDINGS: Diffuse decreased bone mineralization. Minimal osteoarthritic change involving the patellofemoral joint. No acute fracture or dislocation. No significant joint effusion. Atherosclerotic disease is present. Stent present over the popliteal artery. IMPRESSION: 1. No acute findings. 2. Minimal osteoarthritic changes. Electronically Signed   By: Elberta Fortis M.D.   On: 08/25/2022 11:05    Procedures Procedures    Medications Ordered in ED Medications  acetaminophen (TYLENOL) tablet 1,000 mg (1,000 mg Oral Given 08/25/22 1234)    ED Course/ Medical Decision Making/ A&P                                 Medical Decision Making Amount and/or Complexity of Data Reviewed Radiology: ordered.  Risk OTC drugs.   Patient is alert and nontoxic.  He was due for dialysis today.  He had a knee injury 2 days ago.  Patient reports is very painful for him to transfer and weight-bear.  The described mechanism is a twisting and rotational injury.  He  identifies that it is just at the knee and denies lower leg ankle or foot pain open (he does advise that his toenails are exceedingly long and thick and he would like something done about that but this is not an immediate problem and he denies any pain associated with it).   Will obtain x-rays for knee pain.  At this time low suspicion for joint infection.  The joint  is not warm or red.  Patient has a mechanism of injury consistent with complaint.  Knee x-rays interpreted radiology no acute fractures present.  Osteoarthritic changes but no other acute findings.  At this time from perspective of patient's knee injury, I feel it is stable for a knee sleeve or short immobilizer.  Patient does intermittently use a walker which I advise he continue to use with only light weightbearing.  Advise follow-up with orthopedics which patient agrees to.  Regarding dialysis.  Patient is missing dialysis at this time but does not show any signs of acute complications.  He completed dialysis on Thursday.  At this time his lungs are clear and there is no respiratory distress.  Medically well in appearance.  We will see if patient can still make a dialysis slot or, he is stable for next available ASAP.        Final Clinical Impression(s) / ED Diagnoses Final diagnoses:  Sprain of left knee, unspecified ligament, initial encounter    Rx / DC Orders ED Discharge Orders     None         Arby Barrette, MD 08/25/22 1337

## 2022-08-25 NOTE — ED Triage Notes (Signed)
Pt arrives via EMS from home. Pt injured his left leg 2 days ago when he stepped off a sidewalk. Pt was getting ready to go to dialysis but was unable to put pressure on his left leg. Pt did not get dialysis. Pt did have a full session on Thursday. Pt AxOx4.

## 2022-08-25 NOTE — Discharge Instructions (Signed)
1.  Your x-ray does not show any broken bones.  You have sprained your knee.  When you twisted your knee really hard, you may get partial tears and ligament and cartilage.  These can be very painful.  Elevate and ice your knee is much as possible.  Take extra strength Tylenol every 6 hours as needed for pain.  Wear a knee sleeve or a knee immobilizer to help with comfort when you are walking.  Use a walker to avoid full weightbearing.  Schedule a follow-up appointment with an orthopedic doctor for recheck.  If you do not have an orthopedic doctor, the orthopedic doctor on-call has been included in your discharge instructions.

## 2022-08-25 NOTE — Progress Notes (Signed)
Orthopedic Tech Progress Note Patient Details:  Nathaniel White September 14, 1960 [id number]  Ortho Devices Type of Ortho Device: Knee Sleeve (knee brace) Ortho Device/Splint Location: LLE Ortho Device/Splint Interventions: Ordered, Application, Adjustment   Post Interventions Patient Tolerated: Well Instructions Provided: Care of device, Adjustment of device  Malcomb Gangemi Carmine Savoy 08/25/2022, 1:58 PM

## 2022-08-28 ENCOUNTER — Ambulatory Visit: Payer: Medicare Other | Attending: Physician Assistant | Admitting: Physician Assistant

## 2022-08-28 NOTE — Progress Notes (Deleted)
Cardiology Office Note:    Date:  08/28/2022  ID:  AVANT SHOAFF, DOB 1960/05/03, MRN [id number] PCP: Claiborne Rigg, NP   HeartCare Providers Cardiologist:  Verne Carrow, MD Electrophysiologist:  Lanier Prude, MD { Click to update primary MD,subspecialty MD or APP then REFRESH:1}    {Click to Open Review  :1}   Patient Profile:     *** (HFrEF) heart failure with reduced ejection fraction, Stage D  Non-ischemic cardiomyopathy  LHC 10/2013: No CAD R/L heart cath 06/21/2022: Normal coronary arteries, mean PAP 46, mean PCWP 31, CO 2.6 TTE 01/21/2019: EF 40-45, RVSP 40.8, mild MR TTE 03/13/2021: EF 35, RVSP 78.7, moderate MR TTE 09/05/2021: EF 30-35, severe LVH, RVSP 62.5, mod-severe MR, TR  TTE: EF 25-30, no RWMA, severely reduced RVSF, severe LAE, mild RAE, moderate-severe MR, RAP 3 Not a candidate for advanced therapies/transplant >> Palliative Care Ventricular tachycardia Amiodarone Rx - started in 06/2022 Mitral regurgitation (mod to severe) Paroxsymal atrial fibrillation  ?Permanent  Hx of pulmonary embolism  Pulmonary hypertension  Aortic atherosclerosis  ESRD (Tues, Thurs, Sat) S/p failed Renal Tx Chronic Obstructive Pulmonary Disease  Diabetes mellitus  Hx of CVA  Hypotension Midodrine Rx  Hyperlipidemia  Peripheral arterial disease  Hx of GI bleed  DNR        {   Last seen 07/03/22 ED 07/14/22 w weakness, anion gap acidosis; beta hydroxybutyric acid high Lactate high >> +IVFs Trop 108>>118 flat d/w fellow - thought to be more likely low bp during dialysis but could not rule out intermittent VT Hgb 16.8, CXR w edema  EKG AFib     :1}     Discussed the use of AI scribe software for clinical note transcription with the patient, who gave verbal consent to proceed.  History of Present Illness          ROS: ***    Studies Reviewed:       *** Risk Assessment/Calculations:   {Does this patient have ATRIAL FIBRILLATION?[phone]} No BP  recorded.  {Refresh Note OR Click here to enter BP  :1}***       Physical Exam:   VS:  There were no vitals taken for this visit.   Wt Readings from Last 3 Encounters:  08/25/22 165 lb (74.8 kg)  07/18/22 158 lb (71.7 kg)  07/14/22 158 lb 11.7 oz (72 kg)    Physical Exam***     Assessment and Plan:  No problem-specific Assessment & Plan notes found for this encounter. Assessment and Plan          { VT (ventricular tachycardia) (HCC) Status post recent admission with ventricular tachycardia and cardiogenic shock in the setting of biventricular/end-stage heart failure.  VT seems to be quiescent on amiodarone therapy.  We discussed potential side effects with this medication.  He will remain on amiodarone 200 mg twice daily for a total of 7 days and then reduce to 200 mg daily.  I will see him back in 6 to 8 weeks.  I will get an EKG at that visit as well as LFTs, TSH.  I have encouraged him to start seeing an eye doctor.   HFrEF (heart failure with reduced ejection fraction) (HCC) Nonischemic cardiomyopathy.  Cardiac catheterization in the hospital demonstrated no CAD.  End stage heart failure.  He was seen by the advanced heart failure team in the hospital.  His only chance for survival is renal and heart transplant.  He is not a candidate for this  secondary to nonadherence.  He is now DNR.  He has had difficulty with hypotension in the past.  This is limited GDMT.  Volume is managed with dialysis.  His volume is improved on exam compared to the last time I saw him just prior to admission.  If his blood pressure increases over time, we can certainly consider adding hydralazine/nitrates/beta-blocker.   Paroxysmal atrial fibrillation (HCC) He has remained in atrial fibrillation.  Rate is controlled.  He is weight is over 60 kg and his age is <80.  Continue Eliquis 5 mg twice daily.   Mitral regurgitation Moderate to severe MR.  He is not a candidate for advanced therapies/surgery.    End-stage renal disease on hemodialysis [facility] He remains on Tuesday, Thursday, Saturday dialysis.  It sounds like he has been able to get to his dry weight.     :1}    {Are you ordering a CV Procedure (e.g. stress test, cath, DCCV, TEE, etc)?   Press F2        :[id number]}  Dispo:  No follow-ups on file.  Signed, [name], PA-C

## 2022-09-15 ENCOUNTER — Emergency Department (HOSPITAL_COMMUNITY): Payer: Medicare Other

## 2022-09-15 ENCOUNTER — Other Ambulatory Visit: Payer: Self-pay

## 2022-09-15 ENCOUNTER — Inpatient Hospital Stay (HOSPITAL_COMMUNITY): Payer: Medicare Other

## 2022-09-15 ENCOUNTER — Inpatient Hospital Stay (HOSPITAL_COMMUNITY)
Admission: EM | Admit: 2022-09-15 | Discharge: 2022-09-24 | DRG: 709 | Disposition: A | Discharge status: Skilled Nursing Facility | Payer: Medicare Other | Attending: Family Medicine | Admitting: Family Medicine

## 2022-09-15 ENCOUNTER — Encounter (HOSPITAL_COMMUNITY): Payer: Self-pay

## 2022-09-15 DIAGNOSIS — I96 Gangrene, not elsewhere classified: Secondary | ICD-10-CM | POA: Diagnosis present

## 2022-09-15 DIAGNOSIS — R531 Weakness: Secondary | ICD-10-CM

## 2022-09-15 DIAGNOSIS — Z9981 Dependence on supplemental oxygen: Secondary | ICD-10-CM

## 2022-09-15 DIAGNOSIS — Z8249 Family history of ischemic heart disease and other diseases of the circulatory system: Secondary | ICD-10-CM

## 2022-09-15 DIAGNOSIS — Q551 Hypoplasia of testis and scrotum: Secondary | ICD-10-CM

## 2022-09-15 DIAGNOSIS — N2581 Secondary hyperparathyroidism of renal origin: Secondary | ICD-10-CM | POA: Diagnosis present

## 2022-09-15 DIAGNOSIS — I441 Atrioventricular block, second degree: Secondary | ICD-10-CM | POA: Diagnosis present

## 2022-09-15 DIAGNOSIS — N4889 Other specified disorders of penis: Principal | ICD-10-CM | POA: Diagnosis present

## 2022-09-15 DIAGNOSIS — I9589 Other hypotension: Secondary | ICD-10-CM | POA: Diagnosis present

## 2022-09-15 DIAGNOSIS — I4729 Other ventricular tachycardia: Secondary | ICD-10-CM

## 2022-09-15 DIAGNOSIS — I428 Other cardiomyopathies: Secondary | ICD-10-CM | POA: Diagnosis present

## 2022-09-15 DIAGNOSIS — M8580 Other specified disorders of bone density and structure, unspecified site: Secondary | ICD-10-CM | POA: Diagnosis present

## 2022-09-15 DIAGNOSIS — Z7901 Long term (current) use of anticoagulants: Secondary | ICD-10-CM

## 2022-09-15 DIAGNOSIS — Z833 Family history of diabetes mellitus: Secondary | ICD-10-CM

## 2022-09-15 DIAGNOSIS — Z7951 Long term (current) use of inhaled steroids: Secondary | ICD-10-CM

## 2022-09-15 DIAGNOSIS — Z87891 Personal history of nicotine dependence: Secondary | ICD-10-CM

## 2022-09-15 DIAGNOSIS — I5022 Chronic systolic (congestive) heart failure: Secondary | ICD-10-CM | POA: Diagnosis present

## 2022-09-15 DIAGNOSIS — I69354 Hemiplegia and hemiparesis following cerebral infarction affecting left non-dominant side: Secondary | ICD-10-CM

## 2022-09-15 DIAGNOSIS — S81032A Puncture wound without foreign body, left knee, initial encounter: Secondary | ICD-10-CM | POA: Diagnosis present

## 2022-09-15 DIAGNOSIS — D631 Anemia in chronic kidney disease: Secondary | ICD-10-CM | POA: Diagnosis present

## 2022-09-15 DIAGNOSIS — E1151 Type 2 diabetes mellitus with diabetic peripheral angiopathy without gangrene: Secondary | ICD-10-CM | POA: Diagnosis present

## 2022-09-15 DIAGNOSIS — R451 Restlessness and agitation: Secondary | ICD-10-CM | POA: Diagnosis not present

## 2022-09-15 DIAGNOSIS — R54 Age-related physical debility: Secondary | ICD-10-CM | POA: Diagnosis present

## 2022-09-15 DIAGNOSIS — Z781 Physical restraint status: Secondary | ICD-10-CM

## 2022-09-15 DIAGNOSIS — J4489 Other specified chronic obstructive pulmonary disease: Secondary | ICD-10-CM | POA: Diagnosis present

## 2022-09-15 DIAGNOSIS — Z751 Person awaiting admission to adequate facility elsewhere: Secondary | ICD-10-CM

## 2022-09-15 DIAGNOSIS — W19XXXA Unspecified fall, initial encounter: Secondary | ICD-10-CM | POA: Diagnosis present

## 2022-09-15 DIAGNOSIS — Z23 Encounter for immunization: Secondary | ICD-10-CM | POA: Diagnosis not present

## 2022-09-15 DIAGNOSIS — J449 Chronic obstructive pulmonary disease, unspecified: Secondary | ICD-10-CM | POA: Diagnosis not present

## 2022-09-15 DIAGNOSIS — N4821 Abscess of corpus cavernosum and penis: Secondary | ICD-10-CM | POA: Diagnosis not present

## 2022-09-15 DIAGNOSIS — I480 Paroxysmal atrial fibrillation: Secondary | ICD-10-CM | POA: Diagnosis not present

## 2022-09-15 DIAGNOSIS — I340 Nonrheumatic mitral (valve) insufficiency: Secondary | ICD-10-CM | POA: Diagnosis present

## 2022-09-15 DIAGNOSIS — N186 End stage renal disease: Secondary | ICD-10-CM | POA: Diagnosis present

## 2022-09-15 DIAGNOSIS — E871 Hypo-osmolality and hyponatremia: Secondary | ICD-10-CM | POA: Diagnosis present

## 2022-09-15 DIAGNOSIS — Z992 Dependence on renal dialysis: Secondary | ICD-10-CM

## 2022-09-15 DIAGNOSIS — E1122 Type 2 diabetes mellitus with diabetic chronic kidney disease: Secondary | ICD-10-CM | POA: Diagnosis present

## 2022-09-15 DIAGNOSIS — I440 Atrioventricular block, first degree: Secondary | ICD-10-CM

## 2022-09-15 DIAGNOSIS — S3120XA Unspecified open wound of penis, initial encounter: Secondary | ICD-10-CM | POA: Diagnosis not present

## 2022-09-15 DIAGNOSIS — Z888 Allergy status to other drugs, medicaments and biological substances status: Secondary | ICD-10-CM

## 2022-09-15 DIAGNOSIS — A528 Late syphilis, latent: Secondary | ICD-10-CM | POA: Diagnosis present

## 2022-09-15 DIAGNOSIS — E11649 Type 2 diabetes mellitus with hypoglycemia without coma: Secondary | ICD-10-CM | POA: Diagnosis present

## 2022-09-15 DIAGNOSIS — A419 Sepsis, unspecified organism: Secondary | ICD-10-CM | POA: Diagnosis present

## 2022-09-15 DIAGNOSIS — I5020 Unspecified systolic (congestive) heart failure: Secondary | ICD-10-CM

## 2022-09-15 DIAGNOSIS — E119 Type 2 diabetes mellitus without complications: Secondary | ICD-10-CM | POA: Insufficient documentation

## 2022-09-15 DIAGNOSIS — I132 Hypertensive heart and chronic kidney disease with heart failure and with stage 5 chronic kidney disease, or end stage renal disease: Secondary | ICD-10-CM | POA: Diagnosis present

## 2022-09-15 DIAGNOSIS — Z6822 Body mass index (BMI) 22.0-22.9, adult: Secondary | ICD-10-CM

## 2022-09-15 DIAGNOSIS — B9620 Unspecified Escherichia coli [E. coli] as the cause of diseases classified elsewhere: Secondary | ICD-10-CM | POA: Diagnosis present

## 2022-09-15 DIAGNOSIS — I272 Pulmonary hypertension, unspecified: Secondary | ICD-10-CM | POA: Diagnosis present

## 2022-09-15 DIAGNOSIS — E440 Moderate protein-calorie malnutrition: Secondary | ICD-10-CM | POA: Diagnosis present

## 2022-09-15 DIAGNOSIS — Z885 Allergy status to narcotic agent status: Secondary | ICD-10-CM

## 2022-09-15 DIAGNOSIS — Z89422 Acquired absence of other left toe(s): Secondary | ICD-10-CM

## 2022-09-15 DIAGNOSIS — Z79899 Other long term (current) drug therapy: Secondary | ICD-10-CM

## 2022-09-15 DIAGNOSIS — R0902 Hypoxemia: Secondary | ICD-10-CM | POA: Diagnosis not present

## 2022-09-15 LAB — COMPREHENSIVE METABOLIC PANEL
ALT: 21 U/L (ref 0–44)
AST: 40 U/L (ref 15–41)
Albumin: 1.8 g/dL — ABNORMAL LOW (ref 3.5–5.0)
Alkaline Phosphatase: 142 U/L — ABNORMAL HIGH (ref 38–126)
Anion gap: 16 — ABNORMAL HIGH (ref 5–15)
BUN: 45 mg/dL — ABNORMAL HIGH (ref 6–20)
CO2: 25 mmol/L (ref 22–32)
Calcium: 9.2 mg/dL (ref 8.9–10.3)
Chloride: 93 mmol/L — ABNORMAL LOW (ref 98–111)
Creatinine, Ser: 9.4 mg/dL — ABNORMAL HIGH (ref 0.61–1.24)
GFR, Estimated: 6 mL/min — ABNORMAL LOW (ref >60)
Glucose, Bld: 82 mg/dL (ref 70–99)
Potassium: 4.7 mmol/L (ref 3.5–5.1)
Sodium: 134 mmol/L — ABNORMAL LOW (ref 135–145)
Total Bilirubin: 1.8 mg/dL — ABNORMAL HIGH (ref 0.3–1.2)
Total Protein: 7.4 g/dL (ref 6.5–8.1)

## 2022-09-15 LAB — I-STAT CHEM 8, ED
BUN: 41 mg/dL — ABNORMAL HIGH (ref 6–20)
Calcium, Ion: 1.09 mmol/L — ABNORMAL LOW (ref 1.15–1.40)
Chloride: 97 mmol/L — ABNORMAL LOW (ref 98–111)
Creatinine, Ser: 9.5 mg/dL — ABNORMAL HIGH (ref 0.61–1.24)
Glucose, Bld: 80 mg/dL (ref 70–99)
HCT: 40 % (ref 39.0–52.0)
Hemoglobin: 13.6 g/dL (ref 13.0–17.0)
Potassium: 4.7 mmol/L (ref 3.5–5.1)
Sodium: 135 mmol/L (ref 135–145)
TCO2: 25 mmol/L (ref 22–32)

## 2022-09-15 LAB — CBC WITH DIFFERENTIAL/PLATELET
Abs Immature Granulocytes: 0 10*3/uL (ref 0.00–0.07)
Basophils Absolute: 0.1 10*3/uL (ref 0.0–0.1)
Basophils Relative: 1 %
Eosinophils Absolute: 0.5 10*3/uL (ref 0.0–0.5)
Eosinophils Relative: 4 %
HCT: 38.9 % — ABNORMAL LOW (ref 39.0–52.0)
Hemoglobin: 11.7 g/dL — ABNORMAL LOW (ref 13.0–17.0)
Lymphocytes Relative: 4 %
Lymphs Abs: 0.5 10*3/uL — ABNORMAL LOW (ref 0.7–4.0)
MCH: 26.7 pg (ref 26.0–34.0)
MCHC: 30.1 g/dL (ref 30.0–36.0)
MCV: 88.8 fL (ref 80.0–100.0)
Monocytes Absolute: 0.7 10*3/uL (ref 0.1–1.0)
Monocytes Relative: 6 %
Neutro Abs: 9.7 10*3/uL — ABNORMAL HIGH (ref 1.7–7.7)
Neutrophils Relative %: 85 %
Platelets: 190 10*3/uL (ref 150–400)
RBC: 4.38 MIL/uL (ref 4.22–5.81)
RDW: 19 % — ABNORMAL HIGH (ref 11.5–15.5)
WBC: 11.4 10*3/uL — ABNORMAL HIGH (ref 4.0–10.5)
nRBC: 0 % (ref 0.0–0.2)
nRBC: 0 /100 WBC

## 2022-09-15 LAB — I-STAT CG4 LACTIC ACID, ED
Lactic Acid, Venous: 2.5 mmol/L (ref 0.5–1.9)
Lactic Acid, Venous: 0.9 mmol/L (ref 0.5–1.9)

## 2022-09-15 LAB — TROPONIN I (HIGH SENSITIVITY)
Troponin I (High Sensitivity): 122 ng/L (ref <18)
Troponin I (High Sensitivity): 71 ng/L — ABNORMAL HIGH (ref <18)

## 2022-09-15 LAB — CBG MONITORING, ED
Glucose-Capillary: 57 mg/dL — ABNORMAL LOW (ref 70–99)
Glucose-Capillary: 55 mg/dL — ABNORMAL LOW (ref 70–99)
Glucose-Capillary: 68 mg/dL — ABNORMAL LOW (ref 70–99)
Glucose-Capillary: 74 mg/dL (ref 70–99)

## 2022-09-15 LAB — HIV ANTIBODY (ROUTINE TESTING W REFLEX)
HIV Screen 4th Generation wRfx: NONREACTIVE
HIV Screen 4th Generation wRfx: NONREACTIVE

## 2022-09-15 LAB — HEPATITIS B SURFACE ANTIGEN: Hepatitis B Surface Ag: NONREACTIVE

## 2022-09-15 MED ORDER — SODIUM CHLORIDE 0.9 % IV SOLN
100.0000 mg | Freq: Once | INTRAVENOUS | Status: AC
  Administered 2022-09-15: 100 mg via INTRAVENOUS
  Filled 2022-09-15: qty 100

## 2022-09-15 MED ORDER — CHLORHEXIDINE GLUCONATE CLOTH 2 % EX PADS
6.0000 | MEDICATED_PAD | Freq: Every day | CUTANEOUS | Status: DC
  Administered 2022-09-16 – 2022-09-23 (×11): 6 via TOPICAL

## 2022-09-15 MED ORDER — SUCROFERRIC OXYHYDROXIDE 500 MG PO CHEW
1000.0000 mg | CHEWABLE_TABLET | Freq: Two times a day (BID) | ORAL | Status: DC
  Administered 2022-09-16 – 2022-09-24 (×12): 1000 mg via ORAL
  Filled 2022-09-15 (×12): qty 2

## 2022-09-15 MED ORDER — ALBUMIN HUMAN 25 % IV SOLN
25.0000 g | Freq: Once | INTRAVENOUS | Status: AC
  Administered 2022-09-15: 25 g via INTRAVENOUS
  Filled 2022-09-15: qty 100

## 2022-09-15 MED ORDER — VANCOMYCIN HCL 750 MG/150ML IV SOLN: 750.0000 mg | INTRAVENOUS | Status: DC

## 2022-09-15 MED ORDER — MIDODRINE HCL 5 MG PO TABS: 10.0000 mg | ORAL_TABLET | Freq: Three times a day (TID) | ORAL | Status: DC

## 2022-09-15 MED ORDER — METRONIDAZOLE 500 MG/100ML IV SOLN
500.0000 mg | Freq: Two times a day (BID) | INTRAVENOUS | Status: DC
  Administered 2022-09-15 – 2022-09-18 (×6): 500 mg via INTRAVENOUS
  Filled 2022-09-15 (×6): qty 100

## 2022-09-15 MED ORDER — SODIUM CHLORIDE 0.9 % IV SOLN
100.0000 mg | Freq: Two times a day (BID) | INTRAVENOUS | Status: DC
  Administered 2022-09-16 – 2022-09-17 (×5): 100 mg via INTRAVENOUS
  Filled 2022-09-15 (×6): qty 100

## 2022-09-15 MED ORDER — HEPARIN SODIUM (PORCINE) 5000 UNIT/ML IJ SOLN: 5000.0000 [IU] | Freq: Three times a day (TID) | INTRAMUSCULAR | Status: DC

## 2022-09-15 MED ORDER — APIXABAN 5 MG PO TABS
5.0000 mg | ORAL_TABLET | Freq: Two times a day (BID) | ORAL | Status: DC
  Administered 2022-09-15 – 2022-09-16 (×2): 5 mg via ORAL
  Filled 2022-09-15 (×2): qty 1

## 2022-09-15 MED ORDER — MIDODRINE HCL 5 MG PO TABS
10.0000 mg | ORAL_TABLET | Freq: Once | ORAL | Status: AC
  Administered 2022-09-15: 10 mg via ORAL
  Filled 2022-09-15 (×2): qty 2

## 2022-09-15 MED ORDER — APIXABAN 5 MG PO TABS: 5.0000 mg | ORAL_TABLET | Freq: Two times a day (BID) | ORAL | Status: DC

## 2022-09-15 MED ORDER — HEPARIN SODIUM (PORCINE) 1000 UNIT/ML DIALYSIS
1000.0000 [IU] | INTRAMUSCULAR | Status: DC | PRN
  Filled 2022-09-15: qty 1

## 2022-09-15 MED ORDER — SODIUM CHLORIDE 0.9 % IV BOLUS
500.0000 mL | Freq: Once | INTRAVENOUS | Status: AC
  Administered 2022-09-15: 500 mL via INTRAVENOUS

## 2022-09-15 MED ORDER — MIDODRINE HCL 5 MG PO TABS: 5.0000 mg | ORAL_TABLET | ORAL | Status: DC

## 2022-09-15 MED ORDER — IOHEXOL 350 MG/ML SOLN
75.0000 mL | Freq: Once | INTRAVENOUS | Status: AC | PRN
  Administered 2022-09-15: 75 mL via INTRAVENOUS

## 2022-09-15 MED ORDER — SODIUM CHLORIDE 0.9 % IV SOLN
1.0000 g | INTRAVENOUS | Status: DC
  Administered 2022-09-16: 1 g via INTRAVENOUS
  Filled 2022-09-15 (×2): qty 10

## 2022-09-15 MED ORDER — AMIODARONE HCL 200 MG PO TABS
200.0000 mg | ORAL_TABLET | Freq: Every day | ORAL | Status: DC
  Administered 2022-09-16 – 2022-09-24 (×9): 200 mg via ORAL
  Filled 2022-09-15 (×9): qty 1

## 2022-09-15 MED ORDER — MOMETASONE FURO-FORMOTEROL FUM 200-5 MCG/ACT IN AERO
2.0000 | INHALATION_SPRAY | Freq: Two times a day (BID) | RESPIRATORY_TRACT | Status: DC
  Administered 2022-09-16 – 2022-09-24 (×16): 2 via RESPIRATORY_TRACT
  Filled 2022-09-15 (×2): qty 8.8

## 2022-09-15 MED ORDER — SODIUM CHLORIDE 0.9 % IV SOLN
2.0000 g | Freq: Once | INTRAVENOUS | Status: AC
  Administered 2022-09-15: 2 g via INTRAVENOUS
  Filled 2022-09-15: qty 12.5

## 2022-09-15 MED ORDER — SODIUM CHLORIDE 0.9 % IV SOLN: 1.0000 g | Freq: Once | INTRAVENOUS | Status: DC

## 2022-09-15 MED ORDER — MIDODRINE HCL 5 MG PO TABS
10.0000 mg | ORAL_TABLET | Freq: Three times a day (TID) | ORAL | Status: DC
  Administered 2022-09-15 – 2022-09-24 (×26): 10 mg via ORAL
  Filled 2022-09-15 (×26): qty 2

## 2022-09-15 MED ORDER — METRONIDAZOLE 500 MG/100ML IV SOLN
500.0000 mg | Freq: Once | INTRAVENOUS | Status: AC
  Administered 2022-09-15: 500 mg via INTRAVENOUS
  Filled 2022-09-15: qty 100

## 2022-09-15 MED ORDER — ALBUTEROL SULFATE (2.5 MG/3ML) 0.083% IN NEBU
2.5000 mg | INHALATION_SOLUTION | Freq: Four times a day (QID) | RESPIRATORY_TRACT | Status: DC | PRN
  Administered 2022-09-16: 2.5 mg via RESPIRATORY_TRACT
  Filled 2022-09-15: qty 3

## 2022-09-15 MED ORDER — VANCOMYCIN HCL 1500 MG/300ML IV SOLN
1500.0000 mg | Freq: Once | INTRAVENOUS | Status: AC
  Administered 2022-09-15: 1500 mg via INTRAVENOUS
  Filled 2022-09-15 (×2): qty 300

## 2022-09-15 NOTE — Assessment & Plan Note (Signed)
Pt has history of atrial fibrillation.  - apixaban 5 mg BID

## 2022-09-15 NOTE — Assessment & Plan Note (Addendum)
Pt reports 1-2 weeks of reduced movement and inability to walk under his own power. Reports this has been the case since he fell and injured his L knee (see above).  Concerned about the weakness and the timeline as it appears he has multiple possible infectious sources and is at high risk of the possibility of bacteremia.  No evidence of meningitis or encephalitis on physical exam as he is alert and oriented.  Unsure if the patient has some deconditioning as well given his frailty.  Low threshold for brain imaging if he exhibits any concerning symptoms.  However, he is neurologically at baseline.

## 2022-09-15 NOTE — Assessment & Plan Note (Addendum)
Seems to have an abrasion over the left knee with significant swelling and some warmth.  No gout history, maintaining broad differential with possibility of infectious/inflammatory/traumatic causes.  X-ray left knee overall showed osteopenia but no other acute findings.  He is contracted on the left side but has worsening contractures of the left knee since the injury occurred.  Spoke with Dr. Arlean Hopping about the possibility of MRI of the knee with contrast, decided against this and will continue with MRI without contrast of the left knee. - MRI w/o contrast L knee - Abx as above  - Unsure if there is an effusion that needs drainage

## 2022-09-15 NOTE — Progress Notes (Signed)
Daily Progress Note Intern Pager: [phone]  Patient name: Nathaniel White Medical record number: [id number] Date of birth: 1960-11-21 Age: 62 y.o. Gender: male  Primary Care Provider: Claiborne Rigg, NP Consultants: Urology, ID Code Status: Full  Pt Overview and Major Events to Date:  9/7 admitted, bedside debridement by urology  Assessment and Plan:  Nathaniel White is a 62 year old male PMH of ESRD on HD, HFrEF, pAFib, VT, mitral regurgitation, ESRD on HD, HTN, H/O CVA, restrictive lung dx, COPD, anemia of chronic dx, DM2, pulmonary hypertension admitted for generalized weakness and infection/necrosis of penis.  He was started on broad-spectrum antibiotics and had bedside debridement by urology in the ED. Assessment & Plan Sepsis secondary to penile infection Briefly met sepsis criteria in ED with hypotension but does have chronic hypotension requiring midodrine. S/p bedside debridement by urology. Remains afebrile. HIV NR.  Of note, CXR with some of right sided atelectasis versus consolidation-patient denies any coughing and he states that he is chronically on 2 L of oxygen at home. - Urology following, appreciate recs - IV cefepime 1g Q24H (9/7-) - IV doxycycline 100mg  Q12H (9/7-) - IV metronidazole 500 mg Q12H (9/7-) - IV vancomycin per pharmacy T-Th-Sa (dose after hemodialysis) - CBC, RFP daily - GC/chlamydia, RPR pending - F/u blood culture (does not produce urine for Ucx) - Vitals per floor protocol  Puncture wound of left knee with complication Abrasion over the left knee with significant swelling and some warmth.  No gout history, maintaining broad differential with possibility of infectious/inflammatory/traumatic causes. Contracture on left side but worsened with knee injury. MR w/o contrast L knee nondiagnostic but no soft tissue swelling or fluid collections that were seen.  Small knee joint effusion. - Abx as above  - Consider orthopedics consultation if worsening     Weakness generalized 1-2 weeks of reduced movement and inability to walk under his own power. Reports this has been the case since he fell and injured his L knee.  Does have left-sided weakness noted throughout his chart, unsure how acute this reduced movement is. Infectious etiology vs. Deconditioning. -PT/OT -Consider head imaging if neuro exam changes -Consider palliative consultation ESRD (end stage renal disease) (HCC) TTS schedule. Received HD ON with 2.3L taken off.  Very soft blood pressures during HD but seem to be somewhat improved now.  Received dose of albumin and midodrine during HD. - Continue midodrine 10 mg 3 times daily - Renal function panel TTS - Consult to nephro - Avoid nephrotoxic drugs - sucroferric oxyhydroxide 1000mg  BID w meals Diabetes mellitus without complication (HCC) Diet controlled. Hypoglycemia yesterday to 50s.  Improved this a.m. to the 100s. -CBGs q4h -avoid insulin Paroxysmal atrial fibrillation (HCC) Rates controlled. - apixaban 5 mg BID HFrEF (heart failure with reduced ejection fraction) (HCC) CXR with atelectasis v. Infection.  Chronically on 2 L of oxygen at baseline. - Avoid fluid administration unless necessary  Moderate protein-calorie malnutrition (HCC) Pt requires a renal-diet, but has signs of muscle wasting and inadequate food intake. - Evaluate for food insecurity - Nutrition consult placed   FEN/GI: Renal PPx: Eliquis Dispo:Pending PT recommendations  pending clinical improvement . Barriers include IV antibiotics, clinical picture.   Subjective:  Denies any issues overnight.  States that he chronically has low blood pressures.  Denies any dizziness or lightheadedness currently.  He is alert and responsive to all questions.  He denies any pain currently.  Denies any coughing to me.  States that he is on 2  L of oxygen at baseline at home.  Objective: Temp:  [98 F (36.7 C)-98.1 F (36.7 C)] 98.1 F (36.7 C) (09/07  2103) Pulse Rate:  [56-142] 142 (09/07 2330) Resp:  [15-25] 20 (09/07 2330) BP: (72-117)/(47-77) 91/59 (09/07 2330) SpO2:  [84 %-100 %] 88 % (09/07 2330) Weight:  [72.6 kg] 72.6 kg (09/07 0812) Physical Exam: General: NAD, awake, alert, responsive to all questions Cardiovascular: Regular rate, no murmurs Respiratory: Clear to auscultation in anterior lung fields, no increased work of breathing on 2 L nasal cannula Abdomen: Soft, nontender to palpation Extremities: No lower extremity edema, flexed left leg, does have of swelling around left knee and warm in comparison to right knee, superficial abrasion tender to palpation  Laboratory: Most recent CBC Lab Results  Component Value Date   WBC 11.4 (H) 09/15/2022   HGB 13.6 09/15/2022   HCT 40.0 09/15/2022   MCV 88.8 09/15/2022   PLT 190 09/15/2022   Most recent BMP    Latest Ref Rng & Units 09/15/2022    9:04 AM  BMP  Glucose 70 - 99 mg/dL 80   BUN 6 - 20 mg/dL 41   Creatinine 6.06 - 1.24 mg/dL 3.01   Sodium 601 - 093 mmol/L 135   Potassium 3.5 - 5.1 mmol/L 4.7   Chloride 98 - 111 mmol/L 97    HIV NR Hep B antigen NR  Imaging/Diagnostic Tests: MR Knee-Limited, incomplete, essentially nondiagnostic MRI of the left knee.    Levin Erp, MD 09/15/2022, 11:55 PM  PGY-3, [facility] FPTS Intern pager: [phone], text pages welcome Secure chat group [facility] Teaching Service

## 2022-09-15 NOTE — Hospital Course (Addendum)
Nathaniel White is a 62 y.o.male with a history of HFrEF, pAFib, VT, mitral regurgitation, ESRD on HD, HTN, H/O CVA, restrictive lung dx, COPD, anemia of chronic dx, DM2, pulmonary hypertension who was admitted to the [facility] Teaching Service at [facility] for for weakness and penile necrosis. 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  CT pelvis showed no acute findings.  Urology was consulted and performed surgical debridement and partial penectomy.  Patient completed vanc x1 day, doxycycline, metronidazole, cefepime.  Transitioned to p.o. Augmentin x 2 weeks postdebridement. HIV negative, patient was positive for syphilis, started on penicillin G treatment.  Blood cultures no growth x 4 days.  Wound cultures grew E. coli. Will follow up with urology outpatient in clinic.   Generalized weakness 1 to 2 weeks of reduced movement today inability to walk.  Occurred after injury to left knee. Continued to not show any new focal neurologic deficits on physical examination.  PT/OT was consulted and recommended SNF.  Left Knee Swelling  Occurred after injury, abrasion over left knee, MRI of the knee was essentially nondiagnostic due to post CVA chronic left knee and hip contractures, but showed no soft tissue swelling or fluid collections. There was small knee joint effusion.  This remained stable throughout admission.  Consider following up with orthopedics out-patient if continued problem.   Heart Block Patient has history of Mobitz type I block, intermittently had these on his telemetry.  Cardiology consulted initially and did not recommend formal follow-up.    Other chronic conditions were medically managed with home medications and formulary alternatives as necessary (ESRD, COPD)  PCP Follow-up  Recommendations: Goals of care discussion with patient, multiple complex medical problems Ensure patient continues and finishes antibiotic treatment

## 2022-09-15 NOTE — Consult Note (Signed)
Renal Service Consult Note [facility]  Nathaniel White 09/15/2022 Nathaniel Krabbe, MD Requesting Physician: Dr. Pollie Meyer  Reason for Consult: ESRD pt w/  HPI: The patient is a 62 y.o. year-old w/ PMH as below who presented to ED this am c/o general weakness, trouble moving. He fell 2 wks ago and has been having these problems since. Family added he had been complaining about R toe pain, L knee and groin pain, also penile drainage. In ED bp's were low and lactate was 2.5, wbc 11K. CT showed no acute, CXR hazy R base opacity infection vs atx. Pt was admitted for sepsis, unclear source. Possible L knee infection, penile / urethral infection and poss PNA from exam and imaging. We are asked to see for dialysis.   Pt seen in ED. No c/o's other than as above. Last HD was Thursday 2 days ago.    ROS - denies CP, no joint pain, no HA, no blurry vision, no rash, no diarrhea, no nausea/ vomiting, no dysuria, no difficulty voiding   Past Medical History  Past Medical History:  Diagnosis Date   Anemia    Anxiety    Asthma    Complication from renal dialysis device 11/02/2013   Complication of anesthesia 2017   according to pt and spouse pt was moving around and cough while under and pt had difficulty waking up so the anesthesia had to be reversed. and pt admitted to ICU.    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/2008   Qualifier: Diagnosis of  By: Russella Dar MD Nathaniel White    ESRD    on HD, T-TH-Sat - [place]   Hemiparesis due to old stroke [facility])    left   HFrEF (heart failure with reduced ejection fraction) (HCC)    NICM // TTE 06/2022: EF 25-30, sever RV dysfunction, mod to severe MR   Hypertension    Hx, not current problems, no meds   LV dysfunction     EF 25-30% by echo 07/2011   Memory loss due to medical condition    due to stroke   MR (mitral regurgitation)    moderate to severe, echo 07/2011   Nausea with vomiting, unspecified 06/15/2021   Paroxysmal atrial fibrillation (HCC)    Peripheral vascular disease (HCC)    Shortness of breath    with exertion   Stroke (HCC)    TIA's-left sided weakness   Tobacco abuse    Past Surgical History  Past Surgical History:  Procedure Laterality Date   ABDOMINAL AORTOGRAM W/LOWER EXTREMITY Bilateral 07/21/2018   Procedure: ABDOMINAL AORTOGRAM W/LOWER EXTREMITY;  Surgeon: Maeola Harman, MD;  Location: [facility] INVASIVE CV LAB;  Service: Cardiovascular;  Laterality: Bilateral;   AMPUTATION Left 11/05/2018   Procedure: AMPUTATION SECOND TOE LEFT FOOT;  Surgeon: Maeola Harman, MD;  Location: [facility] OR;  Service: Vascular;  Laterality: Left;   AMPUTATION Left 02/13/2021   Procedure: Left small finger AMPUTATION DIGIT;  Surgeon: Marlyne Beards, MD;  Location: [facility] OR;  Service: Orthopedics;  Laterality: Left;   AV FISTULA PLACEMENT     CARDIAC CATHETERIZATION     [place] [facility]   COLONOSCOPY W/ BIOPSIES AND POLYPECTOMY     FISTULA SUPERFICIALIZATION Left 11/10/2013   Procedure: FISTULA PLICATION;  Surgeon: Nada Libman, MD;  Location: [facility] OR;  Service: Vascular;  Laterality: Left;   INSERTION OF DIALYSIS CATHETER N/A 06/20/2021   Procedure: INSERTION OF DIALYSIS CATHETER;  Surgeon: Victorino Sparrow, MD;  Location: [facility] OR;  Service: Vascular;  Laterality: N/A;   KIDNEY TRANSPLANT     2011 rejected kidney 2012 back on dialysis   LEFT AND RIGHT HEART CATHETERIZATION WITH CORONARY ANGIOGRAM N/A 10/09/2013   Procedure: LEFT AND RIGHT HEART CATHETERIZATION WITH CORONARY ANGIOGRAM;  Surgeon: Kathleene Hazel, MD;  Location: [facility] CATH LAB;  Service: Cardiovascular;  Laterality: N/A;   LIGATION OF ARTERIOVENOUS  FISTULA Left 06/20/2021   Procedure: LIGATION OF ARTERIOVENOUS  FISTULA;   Surgeon: Victorino Sparrow, MD;  Location: [facility] OR;  Service: Vascular;  Laterality: Left;   PERIPHERAL VASCULAR INTERVENTION Left 07/21/2018   Procedure: PERIPHERAL VASCULAR INTERVENTION;  Surgeon: Maeola Harman, MD;  Location: [facility] INVASIVE CV LAB;  Service: Cardiovascular;  Laterality: Left;  Popliteal   REVISON OF ARTERIOVENOUS FISTULA Left 08/26/2013   Procedure: EXCISION OF ERODED SKIN AND EXPLORATION OF MAIN LEFT UPPER ARM AV FISTULA;  Surgeon: Larina Earthly, MD;  Location: [facility] OR;  Service: Vascular;  Laterality: Left;   REVISON OF ARTERIOVENOUS FISTULA Left 02/05/2014   Procedure: REPAIR OF ARTERIOVENOUS FISTULA ANEURYSM;  Surgeon: Nada Libman, MD;  Location: [facility] OR;  Service: Vascular;  Laterality: Left;   REVISON OF ARTERIOVENOUS FISTULA Left 03/10/2021   Procedure: LEFT ARM REVISION OF ARTERIOVENOUS FISTULA WITH PLICATION;  Surgeon: Maeola Harman, MD;  Location: [facility] OR;  Service: Vascular;  Laterality: Left;   RIGHT/LEFT HEART CATH AND CORONARY ANGIOGRAPHY N/A 06/21/2022   Procedure: RIGHT/LEFT HEART CATH AND CORONARY ANGIOGRAPHY;  Surgeon: Lennette Bihari, MD;  Location: [facility] INVASIVE CV LAB;  Service: Cardiovascular;  Laterality: N/A;   SHUNTOGRAM N/A 11/05/2012   Procedure: Fistulogram;  Surgeon: Nada Libman, MD;  Location: [facility] CATH LAB;  Service: Cardiovascular;  Laterality: N/A;   TEE WITHOUT CARDIOVERSION  08/17/2011   Procedure: TRANSESOPHAGEAL ECHOCARDIOGRAM (TEE);  Surgeon: Laurey Morale, MD;  Location: [facility] ENDOSCOPY;  Service: Cardiovascular;  Laterality: N/A;   ULTRASOUND GUIDANCE FOR VASCULAR ACCESS  06/20/2021   Procedure: ULTRASOUND GUIDANCE FOR VASCULAR ACCESS;  Surgeon: Victorino Sparrow, MD;  Location: [facility] OR;  Service: Vascular;;   VIDEO ASSISTED THORACOSCOPY (VATS)/DECORTICATION  08/10/11   Family History  Family History  Problem Relation Age of Onset   Hypertension Mother    Varicose Veins Mother    Diabetes Paternal Grandmother    Cancer Paternal  Grandfather    CAD Paternal Uncle    Social History  reports that he quit smoking about 10 years ago. His smoking use included cigarettes. He started smoking about 25 years ago. He has a 7.5 pack-year smoking history. He has never used smokeless tobacco. He reports that he does not drink alcohol and does not use drugs. Allergies  Allergies  Allergen Reactions   Codeine Shortness Of Breath   Dextromethorphan-Guaifenesin Shortness Of Breath and Other (See Comments)   Iron Dextran Shortness Of Breath and Other (See Comments)   Morphine And Codeine Shortness Of Breath    Other Reaction(s): Other (See Comments)   Home medications Prior to Admission medications   Medication Sig Start Date End Date Taking? Authorizing Provider  acetaminophen (TYLENOL) 500 MG tablet Take 1 tablet (500 mg total) by mouth every 6 (six) hours as needed. 04/11/18   Georgetta Haber, NP  albuterol (PROAIR HFA) 108 (  90 Base) MCG/ACT inhaler Inhale 2 puffs into the lungs every 6 (six) hours as needed for wheezing or shortness of breath. 08/25/21   Hoy Register, MD  albuterol (PROVENTIL) (2.5 MG/3ML) 0.083% nebulizer solution Take 3 mLs (2.5 mg total) by nebulization every 6 (six) hours as needed for wheezing or shortness of breath. 08/25/21   Hoy Register, MD  amiodarone (PACERONE) 200 MG tablet Take 1 tablet (200 mg total) by mouth 2 (two) times daily for 7 days, THEN 1 tablet (200 mg total) daily. 06/28/22 10/03/22  Arty Baumgartner, NP  ELIQUIS 5 MG TABS tablet Take 5 mg by mouth 2 (two) times daily. 12/09/21   [provider]  Ferrous Sulfate (IRON PO) Take 1 tablet by mouth in the morning, at noon, and at bedtime.    [provider]  fluticasone-salmeterol (ADVAIR DISKUS) 250-50 MCG/ACT AEPB Inhale 1 puff into the lungs in the morning and at bedtime. NEEDS PASS 08/09/21   Claiborne Rigg, NP  hydrOXYzine (ATARAX) 10 MG tablet Take 1 tablet (10 mg total) by mouth 3 (three) times daily as needed. For  anxiety 06/07/21   Claiborne Rigg, NP  midodrine (PROAMATINE) 5 MG tablet Take one tablet by mouth 3 times daily on non-dialysis days, Mon, Wed, Fri and Sun. 02/19/22   Sharlene Dory, PA-C  montelukast (SINGULAIR) 10 MG tablet Take 1 tablet (10 mg total) by mouth at bedtime. 06/07/21   Claiborne Rigg, NP  sildenafil (VIAGRA) 100 MG tablet Take 0.5-1 tablets (50-100 mg total) by mouth daily as needed for erectile dysfunction. 05/11/22   Claiborne Rigg, NP  VELPHORO 500 MG chewable tablet Chew 1,000 mg by mouth 2 (two) times daily after a meal. 02/06/21   [provider]  VISINE 0.05 % ophthalmic solution Place 2-3 drops into both eyes 2 (two) times daily as needed (dry eye). 07/04/21   [provider]     Vitals:   09/15/22 0915 09/15/22 0930 09/15/22 0952 09/15/22 1320  BP: 117/65 (!) 104/59 (!) 104/56   Pulse:   75   Resp: 17 17 16    Temp:    98 F (36.7 C)  TempSrc:      SpO2:   100%   Weight:      Height:       Exam Gen alert, no distress No rash, cyanosis or gangrene Sclera anicteric, throat clear  No jvd or bruits Chest clear bilat to bases, no rales/ wheezing RRR no RG Abd soft ntnd no mass or ascites +bs GU necrotic tissue on the glans MS no joint effusions or deformity Ext no LE or UE edema, no wounds or ulcers Neuro is alert, Ox 3 , nf    TDC intact   Home meds include - albuterol, amiodarone, eliquis, advair diskus, midodrine 5mg  1- 2 on non-hd days, singulair, velphoro 1 gm ac tid, prns    Na 134  K 4.7  CO2 25 BUN 45  creat 9.4   alb 1.8  tbili 1.8 wbc 11K    Hb 11.7    OP HD: TTS SW  4h  450/ 500   67.5kg  2/2 bath  TDC   Heparin none - last OP HD 9/05, post wt 70.3kg - coming off 3-5kg over for last 3 wks - parsabiv 7.5mg  IV three times per week - no esa or vdra   Assessment/ Plan: Sepsis - abx per pmd Penile dermal necrosis - seen by urology. Possible calciphylaxis. Did  bedside debridement.  ESRD - on HD TTS. HD later tonight.   HTN/ volume - cxr looks like pulm edema, pt coming off over dry wt the last 3 wks. Not edematous. BP's are not great, however. Is on midodrine non HD days at home. Will ^midodrine to 10mg  tid for now and try to get 2.5 L off w/ HD tonight.  Anemia esrd - Hb 11, no esa needs MBD ckd - CCa in range, add on phos.  H/o CVA      Vinson Moselle  MD [facility] 09/15/2022, 3:14 PM  Recent Labs  Lab 09/15/22 0846 09/15/22 0904  HGB 11.7* 13.6  ALBUMIN 1.8*  --   CALCIUM 9.2  --   CREATININE 9.40* 9.50*  K 4.7 4.7   Inpatient medications:  [START ON 09/16/2022] amiodarone  200 mg Oral Daily   [START ON 09/16/2022] apixaban  5 mg Oral BID   [START ON 09/16/2022] midodrine  5 mg Oral QODAY   mometasone-formoterol  2 puff Inhalation BID   [START ON 09/16/2022] sucroferric oxyhydroxide  1,000 mg Oral BID PC    [START ON 09/16/2022] ceFEPime (MAXIPIME) IV     doxycycline (VIBRAMYCIN) IV     metronidazole     vancomycin 1,500 mg (09/15/22 1400)   [START ON 09/18/2022] vancomycin     albuterol

## 2022-09-15 NOTE — H&P (Addendum)
Hospital Admission History and Physical Service Pager: [phone]  Patient name: Nathaniel White Medical record number: [id number] Date of Birth: 07-25-1960 Age: 62 y.o. Gender: male  Primary Care Provider: Claiborne Rigg, NP Consultants: Urology, Infectious Disease Code Status: FULL which was confirmed with family Preferred Emergency Contact: Jaecion, Albini (Mother) [phone] [facility] Phone)   Selena Batten [phone]  Chief Complaint: Weakness, penile discharge.  Assessment and Plan: Nathaniel White is a 62 y.o. male presenting with two weeks of weakness, penile discharge, meeting sepsis criteria briefly. Differential for presentation of this includes infectious etiologies, either from his L knee wound, or necrosis of glans of the penis inducing bacteremia.  Although infectious etiology is the most likely cause of the patient's weakness given presentation and timeline, cannot rule out other possible causes including neurologic pathology, rheumatologic causes, hypo-/hyperthyroidism/endocrine etiology, medication or toxin induced, not going to dialysis appropriately as patient is a poor historian, frailty, functional weakness.  Reassuringly, the patient is at his baseline muscular strength as he has a history of CVA and weakness on the left side.  No recent seizure, not concerned for postictal state.   Assessment & Plan Sepsis, due to unspecified organism, unspecified whether acute organ dysfunction present [facility]) Briefly met sepsis criteria with hypotension although the patient does have a history of hypertension and is on midodrine, had a lactate 2.5 that down trended nicely with leukocytosis.  He did temporarily require oxygen but unsure if true requirement.  The patient does have an evident area of infection in the penis or possibly in the left knee as well.  Additionally, the patient has evidence of possible infiltrate on the left on chest x-ray, cannot rule out pneumonia/malignancy. -Admit to FM  TS, progressive with Dr. Pollie Meyer as attending - Antibiotics for anaerobic coverage, STI, sepsis - cefepime 1g at 259ml/hr (once - 9/7) - doxycycline 100mg  iv  Q12 (day 1 - began 9/7) - metronidazole IVPB 500 mg q 12 (day 1 - began 9/7) - vancomycin 750 mg every T-Th-Sa (dose after hemodialysis) -CBC, RFP daily -GC/chlamydia swab -Cautious with fluid resuscitation given patient is ESRD -Blood culture, defer urine culture as patient does not create urine -- Vitals per floor protocol  Puncture wound of left knee with complication, initial encounter Seems to have an abrasion over the left knee with significant swelling and some warmth.  No gout history, maintaining broad differential with possibility of infectious/inflammatory/traumatic causes.  X-ray left knee overall showed osteopenia but no other acute findings.  He is contracted on the left side but has worsening contractures of the left knee since the injury occurred.  Spoke with Dr. Arlean Hopping about the possibility of MRI of the knee with contrast, decided against this and will continue with MRI without contrast of the left knee. - MRI w/o contrast L knee - Abx as above  - Unsure if there is an effusion that needs drainage    Open wound of penis with complication, initial encounter Patient has open, draining wound on glans of his penis. Pt reports it has been discharging fluid for at least 2, possibly 4 weeks. Family members reported that he was having oral sex with a woman with poor dentition.  - HIV - GC/ Chlamydia Swab - RPR -Urology consulted, appreciate assistance - See abx as above.  Weakness generalized Pt reports 1-2 weeks of reduced movement and inability to walk under his own power. Reports this has been the case since he fell and injured his L knee (see above).  Concerned about the weakness and the timeline as it appears he has multiple possible infectious sources and is at high risk of the possibility of bacteremia.  No evidence  of meningitis or encephalitis on physical exam as he is alert and oriented.  Unsure if the patient has some deconditioning as well given his frailty.  Low threshold for brain imaging if he exhibits any concerning symptoms.  However, he is neurologically at baseline. ESRD (end stage renal disease) (HCC) Patient receives HD Tuesday/Thursday/Saturday - Renal function panel - Consult to nephro - Avoid nephrotoxic drugs - sucroferric oxyhydroxide 1000mg  BID w meals Paroxysmal atrial fibrillation (HCC) Pt has history of atrial fibrillation.  - apixaban 5 mg BID HFrEF (heart failure with reduced ejection fraction) (HCC) - Avoid fluid administration unless necessary - Midodrine 5 mg every other day for pressure support Moderate protein-calorie malnutrition (HCC) Pt requires a renal-diet, but has signs of muscle wasting and inadequate food intake. - Evaluate for food insecurity - Nutrition consult placed Chronic obstructive pulmonary disease, unspecified COPD type (HCC) -Albuterol 2.5 mg Q6 PRN - mometasone-formoterol 200-5, inhalation BID  Right lung opacity that possibly represents atelectasis versus infection, monitor for symptoms.   FEN/GI: Renal diet VTE Prophylaxis: Eliquis  Disposition: Admitted to the progressive service, FMTS  History of Present Illness:  Nathaniel White is a 62 y.o. male presenting with weakness that began between one and two weeks ago. Family members reported to ED staff that they have been carrying him around for nearly two weeks. No worsening unilateral weakness. Localizes pain to back as well. Complains of significant pain in the left knee from fall/injury a couple of weeks ago, and afterward had inability to straighten his leg like he used to.  No confusion, syncope, trauma to the head.  Penile discharge began four weeks ago, though patient reports he had oral sex two or four weeks ago and a possible bite from someone with poor dentition during oral sex.  Patient  has had 1 partner in the past 6 months and reports only male partners  Penis was wrapped in saran wrap to control pus-like drainage from glans/meatus.  Patient also has ESRD, on T-Th-Sat schedule. Has not been missing any dialysis appointments.  h/o two strokes with residual contractures in the left upper extremity and left lower extremity.  In the ED, pt was initially presented with hypotension and a lactate to 2.5.  He also had a slight white count to 11.4.  He had an abdominal CT pelvis that showed no acute findings, left knee x-ray that showed no acute findings, chest x-ray increased hazy opacity at the right lung base, which could represent atelectasis or infection.   Review Of Systems: Per HPI with the following additions:  Review of Systems  Constitutional:  Positive for activity change and fatigue. Negative for chills and diaphoresis.  Respiratory:  Negative for cough, chest tightness and shortness of breath.   Cardiovascular:  Negative for chest pain and palpitations.  Gastrointestinal:  Negative for abdominal pain, diarrhea and nausea.  Genitourinary:  Positive for genital sores, penile discharge, penile pain and penile swelling.  Musculoskeletal:  Positive for back pain, gait problem and joint swelling.       Pt has L knee pain, effusion from fall two weeks ago. See picture below.  Skin:  Positive for color change and wound.       Two major wounds, one on L knee, second on glans of penis.  Neurological:  Positive for weakness.  Psychiatric/Behavioral:  Positive for confusion and decreased concentration.      Pertinent Past Medical History: Anemia End Stage Renal Disease on Dialysis T-Th-Sa Type 2 DM  COPD Esophageal Varices Left-sided Hemiparesis HfrEf HTN Memory Loss Mitral Regurgitation Paroxysmal Afib Peripheral Vascular Disease Vtach  Remainder reviewed in history tab.   Pertinent Past Surgical History: Cardiac catheritization Amputation second left  toe Remainder reviewed in history tab.   Pertinent Social History: Tobacco use: Former Alcohol use: Denies Other Substance use: Denies Lives with two other   Pertinent Family History: Non-contributory. Mother - HTN, varicose veins Paternal Grandmother - Diabetes Paternal uncle - CAD  Remainder reviewed in history tab.   Important Outpatient Medications: albuterol (PROAIR HFA) 108 (90 Base) MCG/ACT inhaler albuterol (PROVENTIL) amiodarone (PACERONE) 200 MG tablet Eliquis Ferrous Sulfate (IRON PO) fluticasone-salmeterol (ADVAIR DISKUS) 250-50 MCG/ACT AEPB hydrOXYzine (ATARAX) midodrine (PROAMATINE) 5 MG tablet montelukast (SINGULAIR) sildenafil (VIAGRA) Velphoro  Remainder reviewed in medication history.   Objective: BP (!) 104/56   Pulse 75   Temp 98 F (36.7 C)   Resp 16   Ht 5\' 9"  (1.753 m)   Wt 72.6 kg   SpO2 100%   BMI 23.63 kg/m   Exam: General: Pt is lying in bed, appears ill.  Eyes: Blue tinged-irises, bilateral conjunctival hemorrhages ENTM: deferred Neck: Deferred Cardiovascular: RRR, no murmurs, rubs, gallops appreciated. Fingernails show  Respiratory: Pt on Lib[place] Negron Torres. No wheezing, coarse breath sounds appreciated Gastrointestinal: No tenderness to palpation, non-distended MSK: Pt's LUE is contracted Derm: Skin of feet is extremely dry, cracked, onchomycoses of all toes, fingers. Splinter hemorrhages of fingernails.  Neuro: Pt is oriented to person, place, but not date. Left sided weakness and contractures due to two previous strokes.           Labs:  CBC BMET  Recent Labs  Lab 09/15/22 0846 09/15/22 0904  WBC 11.4*  --   HGB 11.7* 13.6  HCT 38.9* 40.0  PLT 190  --    Recent Labs  Lab 09/15/22 0846 09/15/22 0904  NA 134* 135  K 4.7 4.7  CL 93* 97*  CO2 25  --   BUN 45* 41*  CREATININE 9.40* 9.50*  GLUCOSE 82 80  CALCIUM 9.2  --     Pertinent additional labs  Lactic Acid, Venous    Component Value Date/Time   LATICACIDVEN 0.9  09/15/2022 1114    Latest Reference Range & Units 09/15/22 08:46 09/15/22 11:02  Troponin I (High Sensitivity) <18 ng/L 122 (HH) 71 (H)   Trending downwards. Reassuring.   Latest Reference Range & Units 09/15/22 09:04 09/15/22 11:14  Lactic Acid, Venous 0.5 - 1.9 mmol/L 2.5 (HH) 0.9  Trending downwards. Reassuring.  EKG: Prolonged QTc, abnormal ST interval. IVCD. Possible bundle branch block?   Repeating EKG today    Imaging Studies Performed:  Imaging Study (CT Abd/Pelvis 9/7) MPRESSION: No acute findings within the abdomen or pelvis. No radiographic evidence of body wall cellulitis or soft tissue gas.   Stable calcified bilateral renal lesions, consistent with benign etiology, likely calcified and proteinaceous renal cysts. This could be due to acquired cystic disease of dialysis or polycystic kidney disease.   Diffusely calcified renal transplant in right iliac fossa.   Colonic diverticulosis, without radiographic evidence of diverticulitis.   Cholelithiasis. No radiographic evidence of cholecystitis.   Small right lower quadrant ventral hernia containing loop of small bowel. No evidence of bowel obstruction or strangulation.   LEFT KNEE - COMPLETE 4+ VIEW (9/7) IMPRESSION: 1. No fracture  or dislocation. Note that the degree of osteopenia slightly limits assessment.   2.  Mild tricompartmental degenerative changes    Signed: Christie Nottingham, MD 09/15/2022 3:42 PM  PGY-1, [facility] Family Medicine  FPTS Intern pager: [phone], text pages welcome Secure chat group [facility] Teaching Service   Upper Level Addendum:  I have seen and evaluated this patient along with Dr. Weston Settle and reviewed the above note, making necessary revisions as appropriate.  I agree with the medical decision making and physical exam as noted above.  Alfredo Martinez, MD PGY-3 [facility] Family Medicine Residency

## 2022-09-15 NOTE — Progress Notes (Signed)
Pharmacy Antibiotic Note  Nathaniel White is a 62 y.o. male admitted on 09/15/2022 presenting with penile necrosis.  Pharmacy has been consulted for vancomycin dosing.  Vancomycin 1500 mg IV x 1 given in ED, ESRD-HD usually TTS  Plan: Vancomycin 750 mg IV q HD Monitor HD schedule, Cx and clinical progression to narrow Vancomycin random level as needed  Height: 5\' 9"  (175.3 cm) Weight: 72.6 kg (160 lb) IBW/kg (Calculated) : 70.7  Temp (24hrs), Avg:98.1 F (36.7 C), Min:98 F (36.7 C), Max:98.1 F (36.7 C)  Recent Labs  Lab 09/15/22 0846 09/15/22 0904 09/15/22 1114  WBC 11.4*  --   --   CREATININE 9.40* 9.50*  --   LATICACIDVEN  --  2.5* 0.9    Estimated Creatinine Clearance: 8.3 mL/min (A) (by C-G formula based on SCr of 9.5 mg/dL (H)).    Allergies  Allergen Reactions   Codeine Shortness Of Breath   Dextromethorphan-Guaifenesin Shortness Of Breath and Other (See Comments)   Iron Dextran Shortness Of Breath and Other (See Comments)   Morphine And Codeine Shortness Of Breath    Other Reaction(s): Other (See Comments)    Daylene Posey, PharmD, [facility] Clinical Pharmacist ED Pharmacist Phone # [phone] 09/15/2022 2:28 PM

## 2022-09-15 NOTE — ED Triage Notes (Addendum)
Pt arrived POV from home stating he doesn't feel good and is having weakness and trouble moving. Pt states he has felt this way since falling 2 weeks ago.   Family member showed up stating he has had right big toe pain, left knee pain and groin pain. Family member also states he has not been able to walk for 2 weeks and family has had to carry him.   Upon further evaluation and examining the patient he had plastic wrap, wrapped around his penis with thick greenish/yellowish drainage coming from the tip of the penis.   Pt is a dialysis pt and normally goes T,TH,S and has access in his left upper chest.

## 2022-09-15 NOTE — ED Notes (Signed)
ED TO INPATIENT HANDOFF REPORT  ED Nurse Name and Phone #: [phone]  S Name/Age/Gender Nathaniel White 62 y.o. male Room/Bed: 027C/027C  Code Status   Code Status: Full Code  Home/SNF/Other Home Patient oriented to: self, place, time, and situation Is this baseline? Yes   Triage Complete: Triage complete  Chief Complaint Weakness [R53.1]  Triage Note Pt arrived POV from home stating he doesn't feel good and is having weakness and trouble moving. Pt states he has felt this way since falling 2 weeks ago.   Family member showed up stating he has had right big toe pain, left knee pain and groin pain. Family member also states he has not been able to walk for 2 weeks and family has had to carry him.   Upon further evaluation and examining the patient he had plastic wrap, wrapped around his penis with thick greenish/yellowish drainage coming from the tip of the penis.   Pt is a dialysis pt and normally goes T,TH,S and has access in his left upper chest.    Allergies Allergies  Allergen Reactions   Codeine Shortness Of Breath   Dextromethorphan-Guaifenesin Shortness Of Breath and Other (See Comments)   Iron Dextran Shortness Of Breath and Other (See Comments)   Morphine And Codeine Shortness Of Breath    Other Reaction(s): Other (See Comments)    Level of Care/Admitting Diagnosis ED Disposition     ED Disposition  Admit   Condition  --   Comment  Hospital Area: [facility] [[id number]]  Level of Care: Progressive [102]  Admit to Progressive based on following criteria: MULTISYSTEM THREATS such as stable sepsis, metabolic/electrolyte imbalance with or without encephalopathy that is responding to early treatment.  May admit patient to [facility] or [facility] if equivalent level of care is available:: Yes  Covid Evaluation: Asymptomatic - no recent exposure (last 10 days) testing not required  Diagnosis: Weakness [241835]  Admitting Physician: Alfredo Martinez [[id number]]  Attending Physician: Latrelle Dodrill [phone]  Certification:: I certify this patient will need inpatient services for at least 2 midnights  Expected Medical Readiness: 09/18/2022          B Medical/Surgery History Past Medical History:  Diagnosis Date   Anemia    Anxiety    Asthma    Complication from renal dialysis device 11/02/2013   Complication of anesthesia 2017   according to pt and spouse pt was moving around and cough while under and pt had difficulty waking up so the anesthesia had to be reversed. and pt admitted to ICU.    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/2008   Qualifier: Diagnosis of  By: Russella Dar MD Marylu Lund    ESRD    on HD, T-TH-Sat - [place]   Hemiparesis due to old stroke [facility])    left   HFrEF (heart failure with reduced ejection fraction) (HCC)    NICM // TTE 06/2022: EF 25-30, sever RV dysfunction, mod to severe MR   Hypertension    Hx, not current problems, no meds   LV dysfunction    EF 25-30% by echo 07/2011   Memory loss due to medical condition    due to stroke   MR (mitral regurgitation)  moderate to severe, echo 07/2011   Nausea with vomiting, unspecified 06/15/2021   Paroxysmal atrial fibrillation (HCC)    Peripheral vascular disease (HCC)    Shortness of breath    with exertion   Stroke (HCC)    TIA's-left sided weakness   Tobacco abuse    Past Surgical History:  Procedure Laterality Date   ABDOMINAL AORTOGRAM W/LOWER EXTREMITY Bilateral 07/21/2018   Procedure: ABDOMINAL AORTOGRAM W/LOWER EXTREMITY;  Surgeon: Maeola Harman, MD;  Location: [facility] INVASIVE CV LAB;  Service: Cardiovascular;  Laterality: Bilateral;   AMPUTATION Left 11/05/2018   Procedure: AMPUTATION SECOND TOE LEFT  FOOT;  Surgeon: Maeola Harman, MD;  Location: [facility] OR;  Service: Vascular;  Laterality: Left;   AMPUTATION Left 02/13/2021   Procedure: Left small finger AMPUTATION DIGIT;  Surgeon: Marlyne Beards, MD;  Location: [facility] OR;  Service: Orthopedics;  Laterality: Left;   AV FISTULA PLACEMENT     CARDIAC CATHETERIZATION     [facility]   COLONOSCOPY W/ BIOPSIES AND POLYPECTOMY     FISTULA SUPERFICIALIZATION Left 11/10/2013   Procedure: FISTULA PLICATION;  Surgeon: Nada Libman, MD;  Location: [facility] OR;  Service: Vascular;  Laterality: Left;   INSERTION OF DIALYSIS CATHETER N/A 06/20/2021   Procedure: INSERTION OF DIALYSIS CATHETER;  Surgeon: Victorino Sparrow, MD;  Location: [facility] OR;  Service: Vascular;  Laterality: N/A;   KIDNEY TRANSPLANT     2011 rejected kidney 2012 back on dialysis   LEFT AND RIGHT HEART CATHETERIZATION WITH CORONARY ANGIOGRAM N/A 10/09/2013   Procedure: LEFT AND RIGHT HEART CATHETERIZATION WITH CORONARY ANGIOGRAM;  Surgeon: Kathleene Hazel, MD;  Location: [facility] CATH LAB;  Service: Cardiovascular;  Laterality: N/A;   LIGATION OF ARTERIOVENOUS  FISTULA Left 06/20/2021   Procedure: LIGATION OF ARTERIOVENOUS  FISTULA;  Surgeon: Victorino Sparrow, MD;  Location: [facility] OR;  Service: Vascular;  Laterality: Left;   PERIPHERAL VASCULAR INTERVENTION Left 07/21/2018   Procedure: PERIPHERAL VASCULAR INTERVENTION;  Surgeon: Maeola Harman, MD;  Location: [facility] INVASIVE CV LAB;  Service: Cardiovascular;  Laterality: Left;  Popliteal   REVISON OF ARTERIOVENOUS FISTULA Left 08/26/2013   Procedure: EXCISION OF ERODED SKIN AND EXPLORATION OF MAIN LEFT UPPER ARM AV FISTULA;  Surgeon: Larina Earthly, MD;  Location: [facility] OR;  Service: Vascular;  Laterality: Left;   REVISON OF ARTERIOVENOUS FISTULA Left 02/05/2014   Procedure: REPAIR OF ARTERIOVENOUS FISTULA ANEURYSM;  Surgeon: Nada Libman, MD;  Location: [facility] OR;  Service: Vascular;  Laterality: Left;   REVISON OF ARTERIOVENOUS FISTULA  Left 03/10/2021   Procedure: LEFT ARM REVISION OF ARTERIOVENOUS FISTULA WITH PLICATION;  Surgeon: Maeola Harman, MD;  Location: [facility] OR;  Service: Vascular;  Laterality: Left;   RIGHT/LEFT HEART CATH AND CORONARY ANGIOGRAPHY N/A 06/21/2022   Procedure: RIGHT/LEFT HEART CATH AND CORONARY ANGIOGRAPHY;  Surgeon: Lennette Bihari, MD;  Location: [facility] INVASIVE CV LAB;  Service: Cardiovascular;  Laterality: N/A;   SHUNTOGRAM N/A 11/05/2012   Procedure: Fistulogram;  Surgeon: Nada Libman, MD;  Location: [facility] CATH LAB;  Service: Cardiovascular;  Laterality: N/A;   TEE WITHOUT CARDIOVERSION  08/17/2011   Procedure: TRANSESOPHAGEAL ECHOCARDIOGRAM (TEE);  Surgeon: Laurey Morale, MD;  Location: [facility] ENDOSCOPY;  Service: Cardiovascular;  Laterality: N/A;   ULTRASOUND GUIDANCE FOR VASCULAR ACCESS  06/20/2021   Procedure: ULTRASOUND GUIDANCE FOR VASCULAR ACCESS;  Surgeon: Victorino Sparrow, MD;  Location: [facility] OR;  Service: Vascular;;   VIDEO ASSISTED THORACOSCOPY (VATS)/DECORTICATION  08/10/11     A IV  Location/Drains/Wounds Patient Lines/Drains/Airways Status     Active Line/Drains/Airways     Name Placement date Placement time Site Days   Peripheral IV  20 G Anterior;Right Antecubital --  --  Antecubital  --   Peripheral IV 09/15/22 Anterior;Right Wrist 09/15/22  0911  Wrist  less than 1   Fistula / Graft Left Upper arm Arteriovenous fistula --  --  Upper arm  --   Fistula / Graft Right Forearm --  --  Forearm  --   Hemodialysis Catheter Left Subclavian Double lumen Permanent (Tunneled) 06/20/21  0851  Subclavian  452            Intake/Output Last 24 hours No intake or output data in the 24 hours ending 09/15/22 1916  Labs/Imaging Results for orders placed or performed during the hospital encounter of 09/15/22 (from the past 48 hour(s))  CBC with Differential     Status: Abnormal   Collection Time: 09/15/22  8:46 AM  Result Value Ref Range   WBC 11.4 (H) 4.0 - 10.5 K/uL   RBC 4.38 4.22 -  5.81 MIL/uL   Hemoglobin 11.7 (L) 13.0 - 17.0 g/dL   HCT 74.2 (L) 59.5 - 63.8 %   MCV 88.8 80.0 - 100.0 fL   MCH 26.7 26.0 - 34.0 pg   MCHC 30.1 30.0 - 36.0 g/dL   RDW 75.6 (H) 43.3 - 29.5 %   Platelets 190 150 - 400 K/uL   nRBC 0.0 0.0 - 0.2 %   Neutrophils Relative % 85 %   Neutro Abs 9.7 (H) 1.7 - 7.7 K/uL   Lymphocytes Relative 4 %   Lymphs Abs 0.5 (L) 0.7 - 4.0 K/uL   Monocytes Relative 6 %   Monocytes Absolute 0.7 0.1 - 1.0 K/uL   Eosinophils Relative 4 %   Eosinophils Absolute 0.5 0.0 - 0.5 K/uL   Basophils Relative 1 %   Basophils Absolute 0.1 0.0 - 0.1 K/uL   nRBC 0 0 /100 WBC   Abs Immature Granulocytes 0.00 0.00 - 0.07 K/uL   Polychromasia PRESENT     Comment: Performed at [facility], [address] [place], [place] [address]  Comprehensive metabolic panel     Status: Abnormal   Collection Time: 09/15/22  8:46 AM  Result Value Ref Range   Sodium 134 (L) 135 - 145 mmol/L   Potassium 4.7 3.5 - 5.1 mmol/L   Chloride 93 (L) 98 - 111 mmol/L   CO2 25 22 - 32 mmol/L   Glucose, Bld 82 70 - 99 mg/dL    Comment: Glucose reference range applies only to samples taken after fasting for at least 8 hours.   BUN 45 (H) 6 - 20 mg/dL   Creatinine, Ser 6.60 (H) 0.61 - 1.24 mg/dL   Calcium 9.2 8.9 - 63.0 mg/dL   Total Protein 7.4 6.5 - 8.1 g/dL   Albumin 1.8 (L) 3.5 - 5.0 g/dL   AST 40 15 - 41 U/L   ALT 21 0 - 44 U/L   Alkaline Phosphatase 142 (H) 38 - 126 U/L   Total Bilirubin 1.8 (H) 0.3 - 1.2 mg/dL   GFR, Estimated 6 (L) >60 mL/min    Comment: (NOTE) Calculated using the CKD-EPI Creatinine Equation (2021)    Anion gap 16 (H) 5 - 15    Comment: Performed at [facility], [address], [place], [place] [address]  Troponin I (High Sensitivity)     Status: Abnormal   Collection  Time: 09/15/22  8:46 AM  Result Value Ref Range   Troponin I (High Sensitivity) 122 (HH) <18 ng/L    Comment: CRITICAL RESULT CALLED TO, READ BACK BY AND VERIFIED WITH A Norvell Ureste RN AT  [phone] BY D LONG (NOTE) Elevated high sensitivity troponin I (hsTnI) values and significant  changes across serial measurements may suggest ACS but many other  chronic and acute conditions are known to elevate hsTnI results.  Refer to the "Links" section for chest pain algorithms and additional  guidance. Performed at [facility], [address], [place], [place] [address]   I-stat chem 8, ED     Status: Abnormal   Collection Time: 09/15/22  9:04 AM  Result Value Ref Range   Sodium 135 135 - 145 mmol/L   Potassium 4.7 3.5 - 5.1 mmol/L   Chloride 97 (L) 98 - 111 mmol/L   BUN 41 (H) 6 - 20 mg/dL   Creatinine, Ser 2.84 (H) 0.61 - 1.24 mg/dL   Glucose, Bld 80 70 - 99 mg/dL    Comment: Glucose reference range applies only to samples taken after fasting for at least 8 hours.   Calcium, Ion 1.09 (L) 1.15 - 1.40 mmol/L   TCO2 25 22 - 32 mmol/L   Hemoglobin 13.6 13.0 - 17.0 g/dL   HCT 13.2 44.0 - 10.2 %  I-Stat Lactic Acid     Status: Abnormal   Collection Time: 09/15/22  9:04 AM  Result Value Ref Range   Lactic Acid, Venous 2.5 (HH) 0.5 - 1.9 mmol/L   Comment NOTIFIED PHYSICIAN   HIV Antibody (routine testing w rflx)     Status: None   Collection Time: 09/15/22 10:06 AM  Result Value Ref Range   HIV Screen 4th Generation wRfx Non Reactive Non Reactive    Comment: Performed at [facility], [address] [place], [place] [address]  Troponin I (High Sensitivity)     Status: Abnormal   Collection Time: 09/15/22 11:02 AM  Result Value Ref Range   Troponin I (High Sensitivity) 71 (H) <18 ng/L    Comment: DELTA CHECK NOTED (NOTE) Elevated high sensitivity troponin I (hsTnI) values and significant  changes across serial measurements may suggest ACS but many other  chronic and acute conditions are known to elevate hsTnI results.  Refer to the "Links" section for chest pain algorithms and additional  guidance. Performed at [facility], [address]  [place], [place] [address]   I-Stat Lactic Acid     Status: None   Collection Time: 09/15/22 11:14 AM  Result Value Ref Range   Lactic Acid, Venous 0.9 0.5 - 1.9 mmol/L  CBG monitoring, ED     Status: Abnormal   Collection Time: 09/15/22  6:14 PM  Result Value Ref Range   Glucose-Capillary 57 (L) 70 - 99 mg/dL    Comment: Glucose reference range applies only to samples taken after fasting for at least 8 hours.  CBG monitoring, ED     Status: Abnormal   Collection Time: 09/15/22  7:09 PM  Result Value Ref Range   Glucose-Capillary 55 (L) 70 - 99 mg/dL    Comment: Glucose reference range applies only to samples taken after fasting for at least 8 hours.   MR KNEE LEFT WO CONTRAST  Result Date: 09/15/2022 CLINICAL DATA:  Soft tissue infection suspected, knee, xray done Concern for knee infection, ESRD patient. Pain EXAM: MRI OF THE LEFT KNEE WITHOUT CONTRAST TECHNIQUE: Multiplanar,  multisequence MR imaging of the knee was attempted. No intravenous contrast was administered. COMPARISON:  X-ray 09/15/2022 FINDINGS: Limited, incomplete, essentially nondiagnostic MRI of the left knee. Only 3 sequences were able to be obtained, each of which is severely degraded by motion artifact. The joint was imaged in 90 degrees of flexion resulting in nonstandard planes. No evidence to suggest a fracture. There is a small knee joint effusion. Assessment of the meniscal and ligamentous structures is unable to be performed on this exam. No soft tissue swelling or fluid collections. There is muscle atrophy. Extensor mechanism appears intact. IMPRESSION: Limited, incomplete, essentially nondiagnostic MRI of the left knee. See above. Electronically Signed   By: Duanne Guess D.O.   On: 09/15/2022 19:00   CT ABDOMEN PELVIS W CONTRAST  Result Date: 09/15/2022 CLINICAL DATA:  Acute abdominal pain. Cellulitis and clinical suspicion for Fournier's gangrene. EXAM: CT ABDOMEN AND PELVIS WITH CONTRAST TECHNIQUE: Multidetector CT  imaging of the abdomen and pelvis was performed using the standard protocol following bolus administration of intravenous contrast. RADIATION DOSE REDUCTION: This exam was performed according to the departmental dose-optimization program which includes automated exposure control, adjustment of the mA and/or kV according to patient size and/or use of iterative reconstruction technique. CONTRAST:  75mL OMNIPAQUE IOHEXOL 350 MG/ML SOLN COMPARISON:  06/21/2021 FINDINGS: Lower Chest: No acute findings. Bibasilar pleural-parenchymal scarring again noted. Hepatobiliary: No suspicious hepatic masses identified. Gallstones are seen, however there is no evidence of cholecystitis or biliary dilatation. Pancreas:  No mass or inflammatory changes. Spleen: Within normal limits in size and appearance. Adrenals/Urinary Tract: Normal adrenal glands. Diffuse bilateral renal involvement by multiple calcified lesions again seen. These show no significant change compared to previous study, consistent with benign etiology, likely representing calcified and proteinaceous renal cysts. These findings may be due to polycystic kidney disease or chronic 6 disease of dialysis. Diffusely calcified renal transplant is again seen in the right iliac fossa. No evidence of ureteral calculi or hydronephrosis. Urinary bladder is empty. Stomach/Bowel: No evidence of obstruction, inflammatory process or abnormal fluid collections. Diverticulosis is seen mainly involving the descending and sigmoid colon, however there is no evidence of diverticulitis. Normal appendix visualized. A small right lower quadrant ventral hernia is again seen containing loop of small bowel. No evidence of bowel obstruction or strangulation. Vascular/Lymphatic: No pathologically enlarged lymph nodes. No acute vascular findings. Reproductive:  No mass or other significant abnormality. Other: None. No evidence of inflammatory process or soft tissue gas in the abdominal or pelvic  wall soft tissues. Musculoskeletal:  No suspicious bone lesions identified. IMPRESSION: No acute findings within the abdomen or pelvis. No radiographic evidence of body wall cellulitis or soft tissue gas. Stable calcified bilateral renal lesions, consistent with benign etiology, likely calcified and proteinaceous renal cysts. This could be due to acquired cystic disease of dialysis or polycystic kidney disease. Diffusely calcified renal transplant in right iliac fossa. Colonic diverticulosis, without radiographic evidence of diverticulitis. Cholelithiasis. No radiographic evidence of cholecystitis. Small right lower quadrant ventral hernia containing loop of small bowel. No evidence of bowel obstruction or strangulation. Electronically Signed   By: Danae Orleans M.D.   On: 09/15/2022 10:55   DG Knee Complete 4 Views Left  Result Date: 09/15/2022 CLINICAL DATA:  Left knee pain EXAM: LEFT KNEE - COMPLETE 4+ VIEW COMPARISON:  None Available. FINDINGS: No evidence of fracture, dislocation, or joint effusion. Vascular calcifications and a vascular stent are noted. No radiopaque foreign body. Tricompartmental mild degenerative changes. IMPRESSION: 1. No fracture  or dislocation. Note that the degree of osteopenia slightly limits assessment. 2.  Mild tricompartmental degenerative changes Electronically Signed   By: Lorenza Cambridge M.D.   On: 09/15/2022 09:40   DG Chest Port 1 View  Result Date: 09/15/2022 CLINICAL DATA:  weakness EXAM: PORTABLE CHEST 1 VIEW COMPARISON:  CXR 07/14/22 FINDINGS: Left-sided central venous catheter with the tip in the right atrium, unchanged. Mild cardiomegaly. Small right pleural effusion, unchanged. No pneumothorax. Compared to prior exam there is increased hazy opacity at the right lung base, which could represent atelectasis or infection. No radiographically apparent displaced rib fractures. Visualized upper abdomen is unremarkable. IMPRESSION: 1. Increased hazy opacity at the right lung  base, which could represent atelectasis or infection. 2. Small right pleural effusion, unchanged. Electronically Signed   By: Lorenza Cambridge M.D.   On: 09/15/2022 09:37    Pending Labs Unresulted Labs (From admission, onward)     Start     Ordered   09/16/22 0500  CBC  Tomorrow morning,   R        09/15/22 1436   09/16/22 0500  Renal function panel  Tomorrow morning,   R        09/15/22 1436   09/15/22 1706  Hepatitis B surface antigen  (New Admission Hemo Labs (Hepatitis B))  Once,   R        09/15/22 1707   09/15/22 1706  Hepatitis B surface antibody,quantitative  (New Admission Hemo Labs (Hepatitis B))  Once,   R        09/15/22 1707   09/15/22 1357  HIV Antibody (routine testing w rflx)  (HIV Antibody (Routine testing w reflex) panel)  Once,   R        09/15/22 1356   09/15/22 1300  RPR  Once,   R        09/15/22 1300   09/15/22 0829  Culture, blood (routine x 2)  BLOOD CULTURE X 2,   R (with STAT occurrences)      09/15/22 0829            Vitals/Pain Today's Vitals   09/15/22 1600 09/15/22 1845 09/15/22 1900 09/15/22 1910  BP: 114/60 103/60 (!) 93/56   Pulse: 79     Resp: 17 (!) 21 (!) 23   Temp:    98.1 F (36.7 C)  TempSrc:      SpO2: 91%     Weight:      Height:      PainSc:        Isolation Precautions No active isolations  Medications Medications  albuterol (PROVENTIL) (2.5 MG/3ML) 0.083% nebulizer solution 2.5 mg (has no administration in time range)  amiodarone (PACERONE) tablet 200 mg (has no administration in time range)  mometasone-formoterol (DULERA) 200-5 MCG/ACT inhaler 2 puff (has no administration in time range)  sucroferric oxyhydroxide (VELPHORO) chewable tablet 1,000 mg (has no administration in time range)  metroNIDAZOLE (FLAGYL) IVPB 500 mg (has no administration in time range)  doxycycline (VIBRAMYCIN) 100 mg in sodium chloride 0.9 % 250 mL IVPB (has no administration in time range)  vancomycin (VANCOREADY) IVPB 750 mg/150 mL (has no  administration in time range)  ceFEPIme (MAXIPIME) 1 g in sodium chloride 0.9 % 100 mL IVPB (has no administration in time range)  apixaban (ELIQUIS) tablet 5 mg (5 mg Oral Given 09/15/22 1834)  Chlorhexidine Gluconate Cloth 2 % PADS 6 each (has no administration in time range)  heparin injection 1,000 Units (has no administration  in time range)  midodrine (PROAMATINE) tablet 10 mg (10 mg Oral Given 09/15/22 1835)  midodrine (PROAMATINE) tablet 10 mg (has no administration in time range)  sodium chloride 0.9 % bolus 500 mL (0 mLs Intravenous Stopped 09/15/22 1000)  metroNIDAZOLE (FLAGYL) IVPB 500 mg (0 mg Intravenous Stopped 09/15/22 1049)  vancomycin (VANCOREADY) IVPB 1500 mg/300 mL (1,500 mg Intravenous New Bag/Given 09/15/22 1400)  ceFEPIme (MAXIPIME) 2 g in sodium chloride 0.9 % 100 mL IVPB (0 g Intravenous Stopped 09/15/22 1137)  doxycycline (VIBRAMYCIN) 100 mg in sodium chloride 0.9 % 250 mL IVPB (0 mg Intravenous Stopped 09/15/22 1422)  iohexol (OMNIPAQUE) 350 MG/ML injection 75 mL (75 mLs Intravenous Contrast Given 09/15/22 1034)    Mobility non-ambulatory     Focused Assessments Renal Assessment Handoff:  Hemodialysis Schedule: Hemodialysis Schedule: Tuesday/Thursday/Saturday Last Hemodialysis date and time: Thursday    Restricted appendage: right arm   R Recommendations: See Admitting Provider Note  Report given to:   Additional Notes:  Pt has an old fistula on the right and a fistula on the left

## 2022-09-15 NOTE — ED Provider Notes (Signed)
[place] AT [facility] Provider Note   CSN: [id number] Arrival date & time: 09/15/22  0801     History  Chief Complaint  Patient presents with   Weakness    Nathaniel White is a 62 y.o. male.  62 year old male with prior medical history as detailed below presents for evaluation.  Per patient report he has been profoundly weak for the last 2 weeks.  He reports that he has not been able to ambulate secondary to left knee pain and right big toe pain.  Symptoms were worse today.  Patient was due for dialysis today.  His typical schedule is Tuesday, Thursday, Saturday.  Dialysis access is currently through a line in the left chest.  Patient complains of pain around his penis and meatus.  Apparently he has been wrapping his penis with Saran or plastic wrap to contain the discharge.  This has been a problem for at least a week.  Additional history provided by patient suggest that his penis became inflamed after he " had a blow job and was bitten."  This may have occurred roughly 2 weeks ago.  The history is provided by the patient, a friend and medical records.       Home Medications Prior to Admission medications   Medication Sig Start Date End Date Taking? Authorizing Provider  acetaminophen (TYLENOL) 500 MG tablet Take 1 tablet (500 mg total) by mouth every 6 (six) hours as needed. 04/11/18   Georgetta Haber, NP  albuterol (PROAIR HFA) 108 (90 Base) MCG/ACT inhaler Inhale 2 puffs into the lungs every 6 (six) hours as needed for wheezing or shortness of breath. 08/25/21   Hoy Register, MD  albuterol (PROVENTIL) (2.5 MG/3ML) 0.083% nebulizer solution Take 3 mLs (2.5 mg total) by nebulization every 6 (six) hours as needed for wheezing or shortness of breath. 08/25/21   Hoy Register, MD  amiodarone (PACERONE) 200 MG tablet Take 1 tablet (200 mg total) by mouth 2 (two) times daily for 7 days, THEN 1 tablet (200 mg total) daily. 06/28/22 10/03/22  Arty Baumgartner, NP  ELIQUIS 5 MG TABS tablet Take 5 mg by mouth 2 (two) times daily. 12/09/21   [provider]  Ferrous Sulfate (IRON PO) Take 1 tablet by mouth in the morning, at noon, and at bedtime.    [provider]  fluticasone-salmeterol (ADVAIR DISKUS) 250-50 MCG/ACT AEPB Inhale 1 puff into the lungs in the morning and at bedtime. NEEDS PASS 08/09/21   Claiborne Rigg, NP  hydrOXYzine (ATARAX) 10 MG tablet Take 1 tablet (10 mg total) by mouth 3 (three) times daily as needed. For anxiety 06/07/21   Claiborne Rigg, NP  midodrine (PROAMATINE) 5 MG tablet Take one tablet by mouth 3 times daily on non-dialysis days, Mon, Wed, Fri and Sun. 02/19/22   Sharlene Dory, PA-C  montelukast (SINGULAIR) 10 MG tablet Take 1 tablet (10 mg total) by mouth at bedtime. 06/07/21   Claiborne Rigg, NP  sildenafil (VIAGRA) 100 MG tablet Take 0.5-1 tablets (50-100 mg total) by mouth daily as needed for erectile dysfunction. 05/11/22   Claiborne Rigg, NP  VELPHORO 500 MG chewable tablet Chew 1,000 mg by mouth 2 (two) times daily after a meal. 02/06/21   [provider]  VISINE 0.05 % ophthalmic solution Place 2-3 drops into both eyes 2 (two) times daily as needed (dry eye). 07/04/21   [provider]      Allergies  Codeine, Dextromethorphan-guaifenesin, Iron dextran, and Morphine and codeine    Review of Systems   Review of Systems  All other systems reviewed and are negative.   Physical Exam Updated Vital Signs BP (!) 81/53 (BP Location: Right Arm)   Pulse 75   Temp 98.1 F (36.7 C) (Oral)   Resp 17   Ht 5\' 9"  (1.753 m)   Wt 72.6 kg   SpO2 92%   BMI 23.63 kg/m  Physical Exam Vitals and nursing note reviewed.  Constitutional:      General: He is not in acute distress.    Appearance: Normal appearance. He is well-developed.  HENT:     Head: Normocephalic and atraumatic.  Eyes:     Conjunctiva/sclera: Conjunctivae normal.     Pupils: Pupils are equal, round, and  reactive to light.  Cardiovascular:     Rate and Rhythm: Normal rate and regular rhythm.     Heart sounds: Normal heart sounds.     Comments: Dialysis line in left anterior chest Pulmonary:     Effort: Pulmonary effort is normal. No respiratory distress.     Breath sounds: Normal breath sounds.  Abdominal:     General: There is no distension.     Palpations: Abdomen is soft.     Tenderness: There is no abdominal tenderness.  Genitourinary:    Comments: Penis initially found wrapped with Saran wrap.  Fortunately, no rubber band or tourniquet keeping the plastic wrap in place.  Gross purulence with disruption of the normal meatus anatomy noted.  See images below.  No surrounding edema, fluctuance, skin changes noted in groin. Musculoskeletal:        General: No deformity.     Cervical back: Normal range of motion and neck supple.     Comments: Patient's left knee is held in a flexed position.  He has a superficial abrasion overlying the anterior aspect of his left knee.  Patient reports pain with attempted extension of his left knee.  Skin:    General: Skin is warm and dry.  Neurological:     General: No focal deficit present.     Mental Status: He is alert and oriented to person, place, and time.        ED Results / Procedures / Treatments   Labs (all labs ordered are listed, but only abnormal results are displayed) Labs Reviewed  CBC WITH DIFFERENTIAL/PLATELET - Abnormal; Notable for the following components:      Result Value   WBC 11.4 (*)    Hemoglobin 11.7 (*)    HCT 38.9 (*)    RDW 19.0 (*)    All other components within normal limits  I-STAT CHEM 8, ED - Abnormal; Notable for the following components:   Chloride 97 (*)    BUN 41 (*)    Creatinine, Ser 9.50 (*)    Calcium, Ion 1.09 (*)    All other components within normal limits  I-STAT CG4 LACTIC ACID, ED - Abnormal; Notable for the following components:   Lactic Acid, Venous 2.5 (*)    All other components  within normal limits  CULTURE, BLOOD (ROUTINE X 2)  CULTURE, BLOOD (ROUTINE X 2)  COMPREHENSIVE METABOLIC PANEL  URINALYSIS, W/ REFLEX TO CULTURE (INFECTION SUSPECTED)  TROPONIN I (HIGH SENSITIVITY)    EKG EKG Interpretation Date/Time:  Saturday September 15 2022 08:16:01 EDT Ventricular Rate:  74 PR Interval:  63 QRS Duration:  155 QT Interval:  460 QTC Calculation: 511 R Axis:   -  8  Text Interpretation: Sinus rhythm Short PR interval IVCD, consider atypical LBBB Confirmed by Kristine Royal [phone]) on 09/15/2022 8:32:12 AM  Radiology No results found.  Procedures Procedures    Medications Ordered in ED Medications  metroNIDAZOLE (FLAGYL) IVPB 500 mg (500 mg Intravenous New Bag/Given 09/15/22 0912)  vancomycin (VANCOREADY) IVPB 1500 mg/300 mL (has no administration in time range)  ceFEPIme (MAXIPIME) 2 g in sodium chloride 0.9 % 100 mL IVPB (has no administration in time range)  sodium chloride 0.9 % bolus 500 mL (500 mLs Intravenous New Bag/Given 09/15/22 0911)    ED Course/ Medical Decision Making/ A&P                                 Medical Decision Making Amount and/or Complexity of Data Reviewed Labs: ordered. Radiology: ordered.  Risk Prescription drug management. Decision regarding hospitalization.    Medical Screen Complete  This patient presented to the ED with complaint of weakness.  This complaint involves an extensive number of treatment options. The initial differential diagnosis includes, but is not limited to, infection, metabolic abnormality  This presentation is: Acute, Chronic, Self-Limited, Previously Undiagnosed, Uncertain Prognosis, Complicated, Systemic Symptoms, and Threat to Life/Bodily Function  Patient on HD with history of ESRD presents with profound weakness.  Patient with mild hypotension and elevated lactic acid.  Exam is most concerning for likely infection particularly at the penis.  Broad-spectrum antibiotics administered in  the ED.  Urology made aware of case and evaluated the patient's purulent drainage from his meatus.  Nephrology is aware of case and will consult given patient's need for routine dialysis.  Medicine service made aware case will evaluate for admission.    Co morbidities that complicated the patient's evaluation  ESRD on HD   Additional history obtained:  Additional history obtained from [facility] External records from outside sources obtained and reviewed including prior ED visits and prior Inpatient records.    Lab Tests:  I ordered and personally interpreted labs.  The pertinent results include: CBC, CMP   Imaging Studies ordered:  I ordered imaging studies including CT abdomen pelvis, plain films of left knee and chest I independently visualized and interpreted obtained imaging which showed NAD I agree with the radiologist interpretation.   Cardiac Monitoring:  The patient was maintained on a cardiac monitor.  I personally viewed and interpreted the cardiac monitor which showed an underlying rhythm of: NSR   Medicines ordered:  I ordered medication including spectrum antibiotics for broad-spectrum antibiotics Reevaluation of the patient after these medicines showed that the patient: improved    Problem List / ED Course:  Weakness   Reevaluation:  After the interventions noted above, I reevaluated the patient and found that they have: improved  Disposition:  After consideration of the diagnostic results and the patients response to treatment, I feel that the patent would benefit from admission.   CRITICAL CARE Performed by: Wynetta Fines   Total critical care time: 30 minutes  Critical care time was exclusive of separately billable procedures and treating other patients.  Critical care was necessary to treat or prevent imminent or life-threatening deterioration.  Critical care was time spent personally by me on the following activities: development of  treatment plan with patient and/or surrogate as well as nursing, discussions with consultants, evaluation of patient's response to treatment, examination of patient, obtaining history from patient or surrogate, ordering and performing treatments and interventions, ordering  and review of laboratory studies, ordering and review of radiographic studies, pulse oximetry and re-evaluation of patient's condition.          Final Clinical Impression(s) / ED Diagnoses Final diagnoses:  Sepsis, due to unspecified organism, unspecified whether acute organ dysfunction present (HCC)  Puncture wound of left knee with complication, initial encounter  Open wound of penis with complication, initial encounter  Weakness generalized  ESRD (end stage renal disease) (HCC)  Paroxysmal atrial fibrillation (HCC)  HFrEF (heart failure with reduced ejection fraction) (HCC)  Moderate protein-calorie malnutrition (HCC)  Chronic obstructive pulmonary disease, unspecified COPD type (HCC)    Rx / DC Orders ED Discharge Orders     None         Wynetta Fines, MD 09/15/22 1611

## 2022-09-15 NOTE — Assessment & Plan Note (Signed)
Briefly met sepsis criteria with hypotension although the patient does have a history of hypertension and is on midodrine, had a lactate 2.5 that down trended nicely with leukocytosis.  He did temporarily require oxygen but unsure if true requirement.  The patient does have an evident area of infection in the penis or possibly in the left knee as well.  Additionally, the patient has evidence of possible infiltrate on the left on chest x-ray, cannot rule out pneumonia/malignancy. -Admit to FM TS, progressive with Dr. Pollie Meyer as attending - Antibiotics for anaerobic coverage, STI, sepsis - cefepime 1g at 253ml/hr (once - 9/7) - doxycycline 100mg  iv  Q12 (day 1 - began 9/7) - metronidazole IVPB 500 mg q 12 (day 1 - began 9/7) - vancomycin 750 mg every T-Th-Sa (dose after hemodialysis) -CBC, RFP daily -GC/chlamydia swab -Cautious with fluid resuscitation given patient is ESRD -Blood culture, defer urine culture as patient does not create urine -- Vitals per floor protocol

## 2022-09-15 NOTE — Assessment & Plan Note (Signed)
Pt requires a renal-diet, but has signs of muscle wasting and inadequate food intake. - Evaluate for food insecurity - Nutrition consult placed

## 2022-09-15 NOTE — ED Notes (Signed)
Pt CBG still reading low, gave patient more OJ with sugar.

## 2022-09-15 NOTE — Consult Note (Signed)
Urology Consult Note   Requesting Attending Physician:  Wynetta Fines, MD Service Providing Consult: Urology  Consulting Attending: Rhoderick Moody, MD   Reason for Consult:  penile necrosis  HPI: Nathaniel White is seen in consultation for reasons noted above at the request of Wynetta Fines, MD for evaluation of penile necrosis.  This is a 62 y.o. male with a history of asthma, ESRD on dialysis (history of prior failed transplant), COPD, diabetes, esophageal varices, stroke with residual hemiparesis, HTN, HFrEF, pafib on eliquis who presented with weakness x2 weeks. Hypotensive to 80s systolic on arrival with venous lactate 2.5. Leukocytosis to 11.4. HIV and G/C swab in process.   He was noted to have thick discharge from his penis. He states for 4 weeks his penis has had discharge, he has been wrapping it with saran wrap and using an unknown OTC cream. It is somewhat painful. He makes no urine and bladder scan 0 on arrival. He is a poor historian but ED provider was told the patient received oral sex from a partner with "poor dentition" with possible bite about 2 weeks ago.  On exam patient's penis is notably firm throughout both corpora. The glans is almost entirely gone with significant associated purulent tissue overlying. There is no visible or palpable meatus. He has atrophic bilateral testicles and a hypoplastic scrotum that is tender to palpation.  CT obtained showing no evidence of soft tissue gas. His bilateral corpora are calcified.   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 Fortunately no evidence of fournier's gangrene on CT scan. Tissue was debrided at bedside to remove necrotic portions.   This is a poor prognostic sign and surgical debridement often only expedites necrosis. Favor local wound care with antibiotics. The area should be kept dry and patient should refrain from applying saran wrap to the  area. No indication for foley placement as patient does not make urine and bladder is empty on scan.    Past Medical History: Past Medical History:  Diagnosis Date   Anemia    Anxiety    Asthma    Complication from renal dialysis device 11/02/2013   Complication of anesthesia 2017   according to pt and spouse pt was moving around and cough while under and pt had difficulty waking up so the anesthesia had to be reversed. and pt admitted to ICU.    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/2008   Qualifier: Diagnosis of  By: Russella Dar MD Marylu Lund    ESRD    on HD, T-TH-Sat - [place]   Hemiparesis due to old stroke [facility])    left   HFrEF (heart failure with reduced ejection fraction) (HCC)    NICM // TTE 06/2022: EF 25-30, sever RV dysfunction, mod to severe MR   Hypertension    Hx, not current problems, no meds   LV dysfunction    EF 25-30% by echo 07/2011   Memory loss due to medical condition    due to stroke   MR (mitral regurgitation)    moderate to severe, echo 07/2011   Nausea with vomiting, unspecified 06/15/2021   Paroxysmal atrial fibrillation (HCC)    Peripheral vascular disease (HCC)  Shortness of breath    with exertion   Stroke [facility])    TIA's-left sided weakness   Tobacco abuse     Past Surgical History:  Past Surgical History:  Procedure Laterality Date   ABDOMINAL AORTOGRAM W/LOWER EXTREMITY Bilateral 07/21/2018   Procedure: ABDOMINAL AORTOGRAM W/LOWER EXTREMITY;  Surgeon: Maeola Harman, MD;  Location: [facility] INVASIVE CV LAB;  Service: Cardiovascular;  Laterality: Bilateral;   AMPUTATION Left 11/05/2018   Procedure: AMPUTATION SECOND TOE LEFT FOOT;  Surgeon: Maeola Harman, MD;  Location: [facility] OR;  Service: Vascular;   Laterality: Left;   AMPUTATION Left 02/13/2021   Procedure: Left small finger AMPUTATION DIGIT;  Surgeon: Marlyne Beards, MD;  Location: [facility] OR;  Service: Orthopedics;  Laterality: Left;   AV FISTULA PLACEMENT     CARDIAC CATHETERIZATION     [facility]   COLONOSCOPY W/ BIOPSIES AND POLYPECTOMY     FISTULA SUPERFICIALIZATION Left 11/10/2013   Procedure: FISTULA PLICATION;  Surgeon: Nada Libman, MD;  Location: [facility] OR;  Service: Vascular;  Laterality: Left;   INSERTION OF DIALYSIS CATHETER N/A 06/20/2021   Procedure: INSERTION OF DIALYSIS CATHETER;  Surgeon: Victorino Sparrow, MD;  Location: [facility] OR;  Service: Vascular;  Laterality: N/A;   KIDNEY TRANSPLANT     2011 rejected kidney 2012 back on dialysis   LEFT AND RIGHT HEART CATHETERIZATION WITH CORONARY ANGIOGRAM N/A 10/09/2013   Procedure: LEFT AND RIGHT HEART CATHETERIZATION WITH CORONARY ANGIOGRAM;  Surgeon: Kathleene Hazel, MD;  Location: [facility] CATH LAB;  Service: Cardiovascular;  Laterality: N/A;   LIGATION OF ARTERIOVENOUS  FISTULA Left 06/20/2021   Procedure: LIGATION OF ARTERIOVENOUS  FISTULA;  Surgeon: Victorino Sparrow, MD;  Location: [facility] OR;  Service: Vascular;  Laterality: Left;   PERIPHERAL VASCULAR INTERVENTION Left 07/21/2018   Procedure: PERIPHERAL VASCULAR INTERVENTION;  Surgeon: Maeola Harman, MD;  Location: [facility] INVASIVE CV LAB;  Service: Cardiovascular;  Laterality: Left;  Popliteal   REVISON OF ARTERIOVENOUS FISTULA Left 08/26/2013   Procedure: EXCISION OF ERODED SKIN AND EXPLORATION OF MAIN LEFT UPPER ARM AV FISTULA;  Surgeon: Larina Earthly, MD;  Location: [facility] OR;  Service: Vascular;  Laterality: Left;   REVISON OF ARTERIOVENOUS FISTULA Left 02/05/2014   Procedure: REPAIR OF ARTERIOVENOUS FISTULA ANEURYSM;  Surgeon: Nada Libman, MD;  Location: [facility] OR;  Service: Vascular;  Laterality: Left;   REVISON OF ARTERIOVENOUS FISTULA Left 03/10/2021   Procedure: LEFT ARM REVISION OF ARTERIOVENOUS FISTULA WITH PLICATION;   Surgeon: Maeola Harman, MD;  Location: [facility] OR;  Service: Vascular;  Laterality: Left;   RIGHT/LEFT HEART CATH AND CORONARY ANGIOGRAPHY N/A 06/21/2022   Procedure: RIGHT/LEFT HEART CATH AND CORONARY ANGIOGRAPHY;  Surgeon: Lennette Bihari, MD;  Location: [facility] INVASIVE CV LAB;  Service: Cardiovascular;  Laterality: N/A;   SHUNTOGRAM N/A 11/05/2012   Procedure: Fistulogram;  Surgeon: Nada Libman, MD;  Location: [facility] CATH LAB;  Service: Cardiovascular;  Laterality: N/A;   TEE WITHOUT CARDIOVERSION  08/17/2011   Procedure: TRANSESOPHAGEAL ECHOCARDIOGRAM (TEE);  Surgeon: Laurey Morale, MD;  Location: [facility] ENDOSCOPY;  Service: Cardiovascular;  Laterality: N/A;   ULTRASOUND GUIDANCE FOR VASCULAR ACCESS  06/20/2021   Procedure: ULTRASOUND GUIDANCE FOR VASCULAR ACCESS;  Surgeon: Victorino Sparrow, MD;  Location: [facility] OR;  Service: Vascular;;   VIDEO ASSISTED THORACOSCOPY (VATS)/DECORTICATION  08/10/11    Medication: Current Facility-Administered Medications  Medication Dose Route Frequency Provider Last Rate Last Admin   ceFEPIme (MAXIPIME) 2 g in sodium chloride 0.9 % 100  mL IVPB  2 g Intravenous Once Quenton Fetter, RPH 200 mL/hr at 09/15/22 1045 2 g at 09/15/22 1045   doxycycline (VIBRAMYCIN) 100 mg in sodium chloride 0.9 % 250 mL IVPB  100 mg Intravenous Once Wynetta Fines, MD       vancomycin (VANCOREADY) IVPB 1500 mg/300 mL  1,500 mg Intravenous Once Quenton Fetter [facility]       Current Outpatient Medications  Medication Sig Dispense Refill   acetaminophen (TYLENOL) 500 MG tablet Take 1 tablet (500 mg total) by mouth every 6 (six) hours as needed. 30 tablet 0   albuterol (PROAIR HFA) 108 (90 Base) MCG/ACT inhaler Inhale 2 puffs into the lungs every 6 (six) hours as needed for wheezing or shortness of breath. 18 g 2   albuterol (PROVENTIL) (2.5 MG/3ML) 0.083% nebulizer solution Take 3 mLs (2.5 mg total) by nebulization every 6 (six) hours as needed for wheezing or shortness of breath. 150 mL  1   amiodarone (PACERONE) 200 MG tablet Take 1 tablet (200 mg total) by mouth 2 (two) times daily for 7 days, THEN 1 tablet (200 mg total) daily. 104 tablet 1   ELIQUIS 5 MG TABS tablet Take 5 mg by mouth 2 (two) times daily.     Ferrous Sulfate (IRON PO) Take 1 tablet by mouth in the morning, at noon, and at bedtime.     fluticasone-salmeterol (ADVAIR DISKUS) 250-50 MCG/ACT AEPB Inhale 1 puff into the lungs in the morning and at bedtime. NEEDS PASS 180 each 6   hydrOXYzine (ATARAX) 10 MG tablet Take 1 tablet (10 mg total) by mouth 3 (three) times daily as needed. For anxiety 60 tablet 0   midodrine (PROAMATINE) 5 MG tablet Take one tablet by mouth 3 times daily on non-dialysis days, Mon, Wed, Fri and Sun. 180 tablet 3   montelukast (SINGULAIR) 10 MG tablet Take 1 tablet (10 mg total) by mouth at bedtime. 90 tablet 3   sildenafil (VIAGRA) 100 MG tablet Take 0.5-1 tablets (50-100 mg total) by mouth daily as needed for erectile dysfunction. 10 tablet 11   VELPHORO 500 MG chewable tablet Chew 1,000 mg by mouth 2 (two) times daily after a meal.     VISINE 0.05 % ophthalmic solution Place 2-3 drops into both eyes 2 (two) times daily as needed (dry eye).      Allergies: Allergies  Allergen Reactions   Codeine Shortness Of Breath   Dextromethorphan-Guaifenesin Shortness Of Breath and Other (See Comments)   Iron Dextran Shortness Of Breath and Other (See Comments)   Morphine And Codeine Shortness Of Breath    Other Reaction(s): Other (See Comments)    Social History: Social History   Tobacco Use   Smoking status: Former    Current packs/day: 0.00    Average packs/day: 0.5 packs/day for 15.0 years (7.5 ttl pk-yrs)    Types: Cigarettes    Start date: 35    Quit date: 2014    Years since quitting: 10.6   Smokeless tobacco: Never  Vaping Use   Vaping status: Never Used  Substance Use Topics   Alcohol use: No    Alcohol/week: 0.0 standard drinks of alcohol   Drug use: No    Family  History Family History  Problem Relation Age of Onset   Hypertension Mother    Varicose Veins Mother    Diabetes Paternal Grandmother    Cancer Paternal Grandfather    CAD Paternal Uncle     Review of Systems 10  systems were reviewed and are negative except as noted specifically in the HPI.  Objective   Vital signs in last 24 hours: BP (!) 104/56   Pulse 75   Temp 98.1 F (36.7 C) (Oral)   Resp 16   Ht 5\' 9"  (1.753 m)   Wt 72.6 kg   SpO2 100%   BMI 23.63 kg/m   Physical Exam General: NAD, A&O, resting, appropriate HEENT: /AT, EOMI, MMM Pulmonary: Normal work of breathing on nasal cannula Cardiovascular: Hypotensive. B Abdomen: Soft, NTTP, nondistended, . GU: Circumcised phallus, significant necrosis/glans tissue loss with overlying purulence. Unable to identify visually or palpably a true meatus. Bilateral corpora are firm, poorly compressible phallus. Bilateral testicles are atrophic and soft, scrotum is mildly tender to palpation. No perineal lesions.  Extremities: Bilateral extremities with poor perfusion Neuro: Appropriate, poor historian  Most Recent Labs: Lab Results  Component Value Date   WBC 11.4 (H) 09/15/2022   HGB 13.6 09/15/2022   HCT 40.0 09/15/2022   PLT 190 09/15/2022    Lab Results  Component Value Date   NA 135 09/15/2022   K 4.7 09/15/2022   CL 97 (L) 09/15/2022   CO2 25 09/15/2022   BUN 41 (H) 09/15/2022   CREATININE 9.50 (H) 09/15/2022   CALCIUM 9.2 09/15/2022   MG 2.3 06/28/2022   PHOS 8.1 (H) 06/28/2022    Lab Results  Component Value Date   INR 1.2 06/20/2021   APTT 73 (H) 09/06/2021     Urine Culture: @[id number] (laburin,org,[id number],[id number])@   IMAGING: DG Knee Complete 4 Views Left  Result Date: 09/15/2022 CLINICAL DATA:  Left knee pain EXAM: LEFT KNEE - COMPLETE 4+ VIEW COMPARISON:  None Available. FINDINGS: No evidence of fracture, dislocation, or joint effusion. Vascular calcifications and a vascular stent are noted. No  radiopaque foreign body. Tricompartmental mild degenerative changes. IMPRESSION: 1. No fracture or dislocation. Note that the degree of osteopenia slightly limits assessment. 2.  Mild tricompartmental degenerative changes Electronically Signed   By: Lorenza Cambridge M.D.   On: 09/15/2022 09:40   DG Chest Port 1 View  Result Date: 09/15/2022 CLINICAL DATA:  weakness EXAM: PORTABLE CHEST 1 VIEW COMPARISON:  CXR 07/14/22 FINDINGS: Left-sided central venous catheter with the tip in the right atrium, unchanged. Mild cardiomegaly. Small right pleural effusion, unchanged. No pneumothorax. Compared to prior exam there is increased hazy opacity at the right lung base, which could represent atelectasis or infection. No radiographically apparent displaced rib fractures. Visualized upper abdomen is unremarkable. IMPRESSION: 1. Increased hazy opacity at the right lung base, which could represent atelectasis or infection. 2. Small right pleural effusion, unchanged. Electronically Signed   By: Lorenza Cambridge M.D.   On: 09/15/2022 09:37    ------  Assessment:  This is a 62 y.o. male with a history of asthma, ESRD on dialysis (history of prior failed transplant), COPD, diabetes, esophageal varices, stroke with residual hemiparesis, HTN, HFrEF, pafib on eliquis for whom urology is consulted regarding penile wound.   No evidence of fournier's gangrene on CT scan. 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 Do not recommend OR debridement at this time.    Recommendations: - Agree with medicine service admission for patient's multiple comorbidities - Local wound debridement today, daily wound care per wound nursing - No foley catheter indicated - HIV and G/C pending - Antibiotics per primary team - Recommend holding eliquis if medically possible - Urology will continue to follow   Thank you for this consult. Please contact the  urology consult pager with any further questions/concerns.

## 2022-09-15 NOTE — ED Notes (Signed)
Pt sugar reading low, RN informed. Pt given orange juice w/ sugar packs in it. Will reassess sugar in 30 mins.

## 2022-09-15 NOTE — Progress Notes (Incomplete)
Daily Progress Note Intern Pager: [phone]  Patient name: Nathaniel White Medical record number: [id number] Date of birth: [birth year] Age: 62 y.o. Gender: male  Primary Care Provider: Claiborne Rigg, NP Consultants: Urology, ID Code Status: Full  Pt Overview and Major Events to Date:  9/6 admitted, bedside debridement by urology  Assessment and Plan:  Dacotah Goad is a 62 year old male PMH of ESRD on HD, HFrEF, pAFib, VT, mitral regurgitation, ESRD on HD, HTN, H/O CVA, restrictive lung dx, COPD, anemia of chronic dx, DM2, pulmonary hypertension admitted for generalized weakness and infection/necrosis of penis.  He was started on broad-spectrum antibiotics and had bedside debridement by urology in the ED. Assessment & Plan Sepsis (HCC) Briefly met sepsis criteria with hypotension but does have chronic hypotension requiring midodrine.  Most likely secondary to necrotic/purulent penile infection. - Antibiotics for anaerobic coverage, STI, sepsis - cefepime 1g at 235ml/hr Q24H (9/7-) - doxycycline 100mg  IV Q12H (9/7-) - metronidazole IVPB 500 mg Q12H (9/7-) - vancomycin per pharmacy T-Th-Sa (dose after hemodialysis) - CBC, RFP daily - GC/chlamydia swab - Cautious with fluid resuscitation given patient is ESRD - Blood culture, defer urine culture as patient does not create urine - Vitals per floor protocol  ESRD (end stage renal disease) (HCC) Patient receives HD Tuesday/Thursday/Saturday - Renal function panel - Consult to nephro - Avoid nephrotoxic drugs - sucroferric oxyhydroxide 1000mg  BID w meals HFrEF (heart failure with reduced ejection fraction) (HCC) - Avoid fluid administration unless necessary - Midodrine 5 mg every other day for pressure support Paroxysmal atrial fibrillation (HCC) Pt has history of atrial fibrillation.  - apixaban 5 mg BID Puncture wound of left knee with complication Seems to have an abrasion over the left knee with significant swelling  and some warmth.  No gout history, maintaining broad differential with possibility of infectious/inflammatory/traumatic causes.  X-ray left knee overall showed osteopenia but no other acute findings.  He is contracted on the left side but has worsening contractures of the left knee since the injury occurred.  Spoke with Dr. Arlean Hopping about the possibility of MRI of the knee with contrast, decided against this and will continue with MRI without contrast of the left knee. - MRI w/o contrast L knee - Abx as above  - Unsure if there is an effusion that needs drainage    Open wound of penis, complicated, initial encounter  Moderate protein-calorie malnutrition (HCC) Pt requires a renal-diet, but has signs of muscle wasting and inadequate food intake. - Evaluate for food insecurity - Nutrition consult placed Weakness generalized Pt reports 1-2 weeks of reduced movement and inability to walk under his own power. Reports this has been the case since he fell and injured his L knee (see above).  Concerned about the weakness and the timeline as it appears he has multiple possible infectious sources and is at high risk of the possibility of bacteremia.  No evidence of meningitis or encephalitis on physical exam as he is alert and oriented.  Unsure if the patient has some deconditioning as well given his frailty.  Low threshold for brain imaging if he exhibits any concerning symptoms.  However, he is neurologically at baseline. Diabetes mellitus without complication (HCC)    Chronic and Stable Issues: ***  FEN/GI: *** PPx: *** Dispo:{FPTSDISOLIST:27587} {FPTSDISOTIME:27588}. Barriers include ***.   Subjective:  ***  Objective: Temp:  [98 F (36.7 C)-98.1 F (36.7 C)] 98.1 F (36.7 C) (09/07 2103) Pulse Rate:  [56-142] 142 (09/07 2330) Resp:  [  15-25] 20 (09/07 2330) BP: (72-117)/(47-77) 91/59 (09/07 2330) SpO2:  [84 %-100 %] 88 % (09/07 2330) Weight:  [72.6 kg] 72.6 kg (09/07 0812) Physical  Exam: General: *** Cardiovascular: *** Respiratory: *** Abdomen: *** Extremities: ***  Laboratory: Most recent CBC Lab Results  Component Value Date   WBC 11.4 (H) 09/15/2022   HGB 13.6 09/15/2022   HCT 40.0 09/15/2022   MCV 88.8 09/15/2022   PLT 190 09/15/2022   Most recent BMP    Latest Ref Rng & Units 09/15/2022    9:04 AM  BMP  Glucose 70 - 99 mg/dL 80   BUN 6 - 20 mg/dL 41   Creatinine 7.82 - 1.24 mg/dL 9.56   Sodium 213 - 086 mmol/L 135   Potassium 3.5 - 5.1 mmol/L 4.7   Chloride 98 - 111 mmol/L 97    HIV NR Hep B antigen NR  Imaging/Diagnostic Tests: MR Knee-Limited, incomplete, essentially nondiagnostic MRI of the left knee.    Levin Erp, MD 09/15/2022, 11:55 PM  PGY-3, [facility] FPTS Intern pager: [phone], text pages welcome Secure chat group [facility] Teaching Service

## 2022-09-15 NOTE — Assessment & Plan Note (Signed)
-  Albuterol 2.5 mg Q6 PRN - mometasone-formoterol 200-5, inhalation BID

## 2022-09-15 NOTE — Assessment & Plan Note (Signed)
-   Avoid fluid administration unless necessary - Midodrine 5 mg every other day for pressure support

## 2022-09-15 NOTE — Progress Notes (Signed)
ED Pharmacy Antibiotic Sign Off An antibiotic consult was received from an ED provider for vancomycin and cefepime per pharmacy dosing for sepsis. A chart review was completed to assess appropriateness.   The following one time order(s) were placed:  Vancomycin 1500mg  IV x1 Cefepime 2g IV x1  Further antibiotic and/or antibiotic pharmacy consults should be ordered by the admitting provider if indicated.   Thank you for allowing pharmacy to be a part of this patient's care.   Link Snuffer, PharmD, BCPS, BCCCP Please refer to [facility] numbers 09/15/22 9:22 AM

## 2022-09-15 NOTE — Assessment & Plan Note (Deleted)
-  CBC daily - Continuous pre

## 2022-09-15 NOTE — Assessment & Plan Note (Addendum)
Patient receives HD Tuesday/Thursday/Saturday - Renal function panel - Consult to nephro - Avoid nephrotoxic drugs - sucroferric oxyhydroxide 1000mg  BID w meals

## 2022-09-15 NOTE — Progress Notes (Signed)
FMTS Brief Progress Note  S: Saw patient at bedside with Dr. Phineas Real.  He states that he is not in any pain right now.  Overall doing okay.  He does have some low blood pressures in dialysis-denies any dizziness or lightheadedness.  He is alert and responsive to all questions.   O: BP (!) 90/54 (BP Location: Right Arm)   Pulse 81   Temp 98.1 F (36.7 C) (Oral)   Resp (!) 21   Ht 5\' 9"  (1.753 m)   Wt 72.6 kg   SpO2 94%   BMI 23.63 kg/m    General: NAD, awake, alert, responsive to questions Head: Normocephalic atraumatic CV: Regular rate  Respiratory:  chest rises symmetrically,  no increased work of breathing Abdomen: Soft, non-tender Extremities: LLE with flexed knee + swelling, mild tenderness to palpation, does not want to straighten out leg  A/P: Penile necrosis + knee puncture wound.  MRI knee nondiagnostic and limited. Overall doing okay right now. Afebrile -continuing broad spectrum Abx  Hypotension Currently 90/54 and asymptomatic. -Continuing midodrine 10 TID -monitor closely after HD   - Orders reviewed. Labs for AM ordered, which was adjusted as needed.   Levin Erp, MD 09/15/2022, 10:16 PM PGY-3, [place] [facility] Night Resident  Please page [phone] with questions.

## 2022-09-16 ENCOUNTER — Other Ambulatory Visit: Payer: Self-pay

## 2022-09-16 ENCOUNTER — Encounter (HOSPITAL_COMMUNITY): Payer: Self-pay | Admitting: Student

## 2022-09-16 ENCOUNTER — Inpatient Hospital Stay (HOSPITAL_COMMUNITY): Payer: Medicare Other

## 2022-09-16 ENCOUNTER — Inpatient Hospital Stay (HOSPITAL_COMMUNITY): Payer: Medicare Other | Admitting: Anesthesiology

## 2022-09-16 ENCOUNTER — Encounter (HOSPITAL_COMMUNITY)
Admission: EM | Disposition: A | Discharge status: Skilled Nursing Facility | Payer: Self-pay | Source: Home / Self Care | Attending: Family Medicine

## 2022-09-16 DIAGNOSIS — J449 Chronic obstructive pulmonary disease, unspecified: Secondary | ICD-10-CM

## 2022-09-16 DIAGNOSIS — I480 Paroxysmal atrial fibrillation: Secondary | ICD-10-CM

## 2022-09-16 DIAGNOSIS — S3120XA Unspecified open wound of penis, initial encounter: Secondary | ICD-10-CM | POA: Diagnosis not present

## 2022-09-16 DIAGNOSIS — N4821 Abscess of corpus cavernosum and penis: Secondary | ICD-10-CM

## 2022-09-16 HISTORY — PX: SCROTAL EXPLORATION: SHX2386

## 2022-09-16 LAB — CBC
HCT: 38 % — ABNORMAL LOW (ref 39.0–52.0)
Hemoglobin: 11.8 g/dL — ABNORMAL LOW (ref 13.0–17.0)
MCH: 26.9 pg (ref 26.0–34.0)
MCHC: 31.1 g/dL (ref 30.0–36.0)
MCV: 86.6 fL (ref 80.0–100.0)
Platelets: 160 10*3/uL (ref 150–400)
RBC: 4.39 MIL/uL (ref 4.22–5.81)
RDW: 18.7 % — ABNORMAL HIGH (ref 11.5–15.5)
WBC: 13 10*3/uL — ABNORMAL HIGH (ref 4.0–10.5)
nRBC: 0 % (ref 0.0–0.2)

## 2022-09-16 LAB — GLUCOSE, CAPILLARY
Glucose-Capillary: 74 mg/dL (ref 70–99)
Glucose-Capillary: 114 mg/dL — ABNORMAL HIGH (ref 70–99)
Glucose-Capillary: 106 mg/dL — ABNORMAL HIGH (ref 70–99)
Glucose-Capillary: 90 mg/dL (ref 70–99)
Glucose-Capillary: 62 mg/dL — ABNORMAL LOW (ref 70–99)
Glucose-Capillary: 79 mg/dL (ref 70–99)
Glucose-Capillary: 93 mg/dL (ref 70–99)

## 2022-09-16 LAB — RENAL FUNCTION PANEL
Albumin: 2.3 g/dL — ABNORMAL LOW (ref 3.5–5.0)
Anion gap: 17 — ABNORMAL HIGH (ref 5–15)
BUN: 21 mg/dL — ABNORMAL HIGH (ref 6–20)
CO2: 22 mmol/L (ref 22–32)
Calcium: 8.9 mg/dL (ref 8.9–10.3)
Chloride: 93 mmol/L — ABNORMAL LOW (ref 98–111)
Creatinine, Ser: 4.95 mg/dL — ABNORMAL HIGH (ref 0.61–1.24)
GFR, Estimated: 13 mL/min — ABNORMAL LOW (ref >60)
Glucose, Bld: 77 mg/dL (ref 70–99)
Phosphorus: 3.4 mg/dL (ref 2.5–4.6)
Potassium: 3.6 mmol/L (ref 3.5–5.1)
Sodium: 132 mmol/L — ABNORMAL LOW (ref 135–145)

## 2022-09-16 LAB — MRSA NEXT GEN BY PCR, NASAL: MRSA by PCR Next Gen: NOT DETECTED

## 2022-09-16 LAB — RPR
RPR Ser Ql: REACTIVE — AB
RPR Titer: 1:128 {titer}

## 2022-09-16 SURGERY — EXPLORATION, SCROTUM
Anesthesia: General | Site: Penis

## 2022-09-16 MED ORDER — FENTANYL CITRATE (PF) 250 MCG/5ML IJ SOLN
INTRAMUSCULAR | Status: AC
  Filled 2022-09-16: qty 5

## 2022-09-16 MED ORDER — PROPOFOL 10 MG/ML IV BOLUS
INTRAVENOUS | Status: DC | PRN
  Administered 2022-09-16: 50 mg via INTRAVENOUS

## 2022-09-16 MED ORDER — LIDOCAINE HCL 2 % IJ SOLN
INTRAMUSCULAR | Status: DC | PRN
Start: 2022-09-16 — End: 2022-09-16
  Administered 2022-09-16: 10 mL

## 2022-09-16 MED ORDER — PHENYLEPHRINE HCL-NACL 20-0.9 MG/250ML-% IV SOLN
INTRAVENOUS | Status: DC | PRN
Start: 2022-09-16 — End: 2022-09-16
  Administered 2022-09-16: 50 ug/min via INTRAVENOUS

## 2022-09-16 MED ORDER — SUCCINYLCHOLINE CHLORIDE 20 MG/ML IJ SOLN
INTRAMUSCULAR | Status: DC | PRN
  Administered 2022-09-16: 100 mg via INTRAVENOUS

## 2022-09-16 MED ORDER — FENTANYL CITRATE (PF) 100 MCG/2ML IJ SOLN: 25.0000 ug | INTRAMUSCULAR | Status: DC | PRN

## 2022-09-16 MED ORDER — CHLORHEXIDINE GLUCONATE 0.12 % MT SOLN
OROMUCOSAL | Status: AC
  Administered 2022-09-16: 15 mL
  Filled 2022-09-16: qty 15

## 2022-09-16 MED ORDER — DEXTROSE 50 % IV SOLN: 12.5000 g | Freq: Once | INTRAVENOUS | Status: DC

## 2022-09-16 MED ORDER — MIDAZOLAM HCL 5 MG/5ML IJ SOLN
INTRAMUSCULAR | Status: DC | PRN
  Administered 2022-09-16 (×2): 1 mg via INTRAVENOUS

## 2022-09-16 MED ORDER — KETAMINE HCL 50 MG/5ML IJ SOSY
PREFILLED_SYRINGE | INTRAMUSCULAR | Status: AC
  Filled 2022-09-16: qty 5

## 2022-09-16 MED ORDER — ALBUMIN HUMAN 5 % IV SOLN
12.5000 g | Freq: Once | INTRAVENOUS | Status: AC
  Administered 2022-09-16: 12.5 g via INTRAVENOUS

## 2022-09-16 MED ORDER — SODIUM CHLORIDE 0.9 % IV SOLN: INTRAVENOUS | Status: DC

## 2022-09-16 MED ORDER — FENTANYL CITRATE (PF) 100 MCG/2ML IJ SOLN
INTRAMUSCULAR | Status: DC | PRN
  Administered 2022-09-16: 50 ug via INTRAVENOUS

## 2022-09-16 MED ORDER — ALBUTEROL SULFATE HFA 108 (90 BASE) MCG/ACT IN AERS
INHALATION_SPRAY | RESPIRATORY_TRACT | Status: DC | PRN
  Administered 2022-09-16: 3 via RESPIRATORY_TRACT

## 2022-09-16 MED ORDER — LIDOCAINE HCL 2 % IJ SOLN
INTRAMUSCULAR | Status: AC
  Filled 2022-09-16: qty 20

## 2022-09-16 MED ORDER — ACETAMINOPHEN 10 MG/ML IV SOLN
INTRAVENOUS | Status: AC
  Filled 2022-09-16: qty 100

## 2022-09-16 MED ORDER — PHENYLEPHRINE HCL-NACL 20-0.9 MG/250ML-% IV SOLN
INTRAVENOUS | Status: AC
  Filled 2022-09-16: qty 250

## 2022-09-16 MED ORDER — BENZONATATE 100 MG PO CAPS
100.0000 mg | ORAL_CAPSULE | Freq: Every day | ORAL | Status: DC | PRN
  Administered 2022-09-16 – 2022-09-20 (×3): 100 mg via ORAL
  Filled 2022-09-16 (×3): qty 1

## 2022-09-16 MED ORDER — CHLORHEXIDINE GLUCONATE 0.12 % MT SOLN: 15.0000 mL | Freq: Once | OROMUCOSAL | Status: DC

## 2022-09-16 MED ORDER — DEXTROSE 50 % IV SOLN
INTRAVENOUS | Status: DC | PRN
Start: 2022-09-16 — End: 2022-09-16
  Administered 2022-09-16: 12.5 g via INTRAVENOUS

## 2022-09-16 MED ORDER — ORAL CARE MOUTH RINSE: 15.0000 mL | Freq: Once | OROMUCOSAL | Status: DC

## 2022-09-16 MED ORDER — SODIUM CHLORIDE 0.9 % IV SOLN
INTRAVENOUS | Status: DC | PRN
Start: 2022-09-16 — End: 2022-09-16

## 2022-09-16 MED ORDER — ONDANSETRON HCL 4 MG/2ML IJ SOLN: 4.0000 mg | Freq: Once | INTRAMUSCULAR | Status: DC | PRN

## 2022-09-16 MED ORDER — ACETAMINOPHEN 10 MG/ML IV SOLN
INTRAVENOUS | Status: DC | PRN
Start: 2022-09-16 — End: 2022-09-16
  Administered 2022-09-16: 1000 mg via INTRAVENOUS

## 2022-09-16 MED ORDER — LIDOCAINE 2% (20 MG/ML) 5 ML SYRINGE
INTRAMUSCULAR | Status: DC | PRN
  Administered 2022-09-16: 80 mg via INTRAVENOUS

## 2022-09-16 MED ORDER — DEXTROSE 50 % IV SOLN
INTRAVENOUS | Status: AC
  Filled 2022-09-16: qty 50

## 2022-09-16 MED ORDER — ALBUMIN HUMAN 5 % IV SOLN
INTRAVENOUS | Status: AC
  Filled 2022-09-16: qty 250

## 2022-09-16 MED ORDER — KETAMINE HCL 10 MG/ML IJ SOLN
INTRAMUSCULAR | Status: DC | PRN
Start: 2022-09-16 — End: 2022-09-16
  Administered 2022-09-16: 5 mg via INTRAVENOUS
  Administered 2022-09-16: 20 mg via INTRAVENOUS

## 2022-09-16 MED ORDER — INFLUENZA VIRUS VACC SPLIT PF (FLUZONE) 0.5 ML IM SUSY
0.5000 mL | PREFILLED_SYRINGE | INTRAMUSCULAR | Status: AC
  Administered 2022-09-20: 0.5 mL via INTRAMUSCULAR
  Filled 2022-09-16: qty 0.5

## 2022-09-16 MED ORDER — MIDAZOLAM HCL 2 MG/2ML IJ SOLN
INTRAMUSCULAR | Status: AC
  Filled 2022-09-16: qty 2

## 2022-09-16 MED ORDER — EPHEDRINE SULFATE (PRESSORS) 50 MG/ML IJ SOLN
INTRAMUSCULAR | Status: DC | PRN
Start: 2022-09-16 — End: 2022-09-16
  Administered 2022-09-16: 5 mg via INTRAVENOUS

## 2022-09-16 MED ORDER — APIXABAN 5 MG PO TABS
5.0000 mg | ORAL_TABLET | Freq: Two times a day (BID) | ORAL | Status: DC
  Administered 2022-09-17 – 2022-09-24 (×14): 5 mg via ORAL
  Filled 2022-09-16 (×14): qty 1

## 2022-09-16 MED ORDER — 0.9 % SODIUM CHLORIDE (POUR BTL) OPTIME
TOPICAL | Status: DC | PRN
Start: 2022-09-16 — End: 2022-09-16
  Administered 2022-09-16: 1000 mL

## 2022-09-16 MED ORDER — ONDANSETRON HCL 4 MG/2ML IJ SOLN
INTRAMUSCULAR | Status: DC | PRN
  Administered 2022-09-16: 4 mg via INTRAVENOUS

## 2022-09-16 MED ORDER — DEXAMETHASONE SODIUM PHOSPHATE 4 MG/ML IJ SOLN
INTRAMUSCULAR | Status: DC | PRN
  Administered 2022-09-16: 10 mg via INTRAVENOUS

## 2022-09-16 MED ORDER — ACETAMINOPHEN 500 MG PO TABS
1000.0000 mg | ORAL_TABLET | Freq: Four times a day (QID) | ORAL | Status: DC | PRN
  Filled 2022-09-16: qty 2

## 2022-09-16 SURGICAL SUPPLY — 32 items
BAG COUNTER SPONGE SURGICOUNT (BAG) IMPLANT
BAG SPNG CNTER NS LX DISP (BAG)
BLADE HEX COATED 2.75 (ELECTRODE) ×1 IMPLANT
BNDG GAUZE DERMACEA FLUFF 4 (GAUZE/BANDAGES/DRESSINGS) ×1 IMPLANT
BNDG GZE DERMACEA 4 6PLY (GAUZE/BANDAGES/DRESSINGS) ×2
COVER SURGICAL LIGHT HANDLE (MISCELLANEOUS) ×1 IMPLANT
DRAPE LAPAROTOMY 100X72 PEDS (DRAPES) ×1 IMPLANT
ELECT NDL TIP 2.8 STRL (NEEDLE) ×1 IMPLANT
ELECT NEEDLE TIP 2.8 STRL (NEEDLE) ×1 IMPLANT
ELECT REM PT RETURN 15FT ADLT (MISCELLANEOUS) ×1 IMPLANT
GLOVE BIOGEL M STRL SZ7.5 (GLOVE) ×1 IMPLANT
GOWN STRL REUS W/ TWL LRG LVL3 (GOWN DISPOSABLE) ×1 IMPLANT
GOWN STRL REUS W/TWL LRG LVL3 (GOWN DISPOSABLE) ×1
KIT BASIN OR (CUSTOM PROCEDURE TRAY) ×1 IMPLANT
NDL HYPO 22X1.5 SAFETY MO (MISCELLANEOUS) IMPLANT
NEEDLE HYPO 22X1.5 SAFETY MO (MISCELLANEOUS) ×1 IMPLANT
NS IRRIG 1000ML POUR BTL (IV SOLUTION) ×1 IMPLANT
PACK GENERAL/GYN (CUSTOM PROCEDURE TRAY) ×1 IMPLANT
SUPPORT SCROTAL LG STRP (MISCELLANEOUS) ×1 IMPLANT
SUPPORTER AHLETIC TETRA LG (SOFTGOODS) IMPLANT
SUT CHROMIC 2 0 SH (SUTURE) IMPLANT
SUT CHROMIC 3 0 SH 27 (SUTURE) ×2 IMPLANT
SUT MON AB 3-0 SH 27 (SUTURE)
SUT MON AB 3-0 SH27 (SUTURE) ×1 IMPLANT
SUT SILK 0 SH 30 (SUTURE) ×1 IMPLANT
SUT VIC AB 2-0 UR5 27 (SUTURE) IMPLANT
SUT VICRYL 0 TIES 12 18 (SUTURE) IMPLANT
SWAB COLLECTION DEVICE MRSA (MISCELLANEOUS) IMPLANT
SWAB CULTURE LIQ STUART DBL (MISCELLANEOUS) IMPLANT
SYR CONTROL 10ML LL (SYRINGE) IMPLANT
TOWEL GREEN STERILE (TOWEL DISPOSABLE) ×2 IMPLANT
WATER STERILE IRR 1000ML POUR (IV SOLUTION) ×1 IMPLANT

## 2022-09-16 NOTE — Assessment & Plan Note (Addendum)
Briefly met sepsis criteria in ED with hypotension but does have chronic hypotension requiring midodrine. S/p bedside debridement by urology. Remains afebrile. HIV NR.  Of note, CXR with some of right sided atelectasis versus consolidation-patient denies any coughing and he states that he is chronically on 2 L of oxygen at home. - Urology following, appreciate recs - IV cefepime 1g Q24H (9/7-) - IV doxycycline 100mg  Q12H (9/7-) - IV metronidazole 500 mg Q12H (9/7-) - IV vancomycin per pharmacy T-Th-Sa (dose after hemodialysis) - CBC, RFP daily - GC/chlamydia, RPR pending - F/u blood culture (does not produce urine for Ucx) - Vitals per floor protocol

## 2022-09-16 NOTE — Evaluation (Signed)
Occupational Therapy Evaluation Patient Details Name: Nathaniel White MRN: [id number] DOB: 1960-11-14 Today's Date: 09/16/2022   History of Present Illness Pt is 62 year old presented to [facility] on  09/15/22 for sepsis due to penile infection.Urology with bedside debridement on 9/7 and taking to OR for debridement on 9/8. 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 Pt also with painful lt knee wound from fall several weeks ago. PMH - stroke (residual L side weakness), ESRD (HD TTS), HTN, PVD, COPD, CHF, asthma, anxiety.   Clinical Impression   Pt reports having assist at baseline for ADLs at bed/wheelchair level, friends whom he lives with assist him by pivoting him to w/c from the bed. Pt currently with LUE deficits from prior CVA and reports "twisting" his shoulder a few weeks ago. Pt needing min-total A for ADLs, total A +2 for bed mobility. Pt oriented to self and place, disoriented to time but follows commands with incr time/cues. Pt presenting with impairments listed below, will follow acutely. Patient will benefit from continued inpatient follow up therapy, <3 hours/day to maximize safety/ind with ADLs/functional mobility.        If plan is discharge home, recommend the following: Two people to help with walking and/or transfers;Two people to help with bathing/dressing/bathroom;Assistance with cooking/housework;Direct supervision/assist for medications management;Direct supervision/assist for financial management;Help with stairs or ramp for entrance;Assist for transportation    Functional Status Assessment  Patient has had a recent decline in their functional status and demonstrates the ability to make significant improvements in function in a reasonable and predictable amount of time.  Equipment Recommendations  Other (comment) (defer)    Recommendations for Other Services PT consult     Precautions / Restrictions Precautions Precautions: Fall Precaution Comments: 2L  baseline Restrictions Weight Bearing Restrictions: No      Mobility Bed Mobility Overal bed mobility: Needs Assistance Bed Mobility: Rolling, Supine to Sit, Sit to Supine Rolling: Total assist   Supine to sit: +2 for physical assistance, Total assist Sit to supine: +2 for physical assistance, Total assist   General bed mobility comments: Assist for all aspects    Transfers                   General transfer comment: Did not attempt      Balance Overall balance assessment: Needs assistance Sitting-balance support: Feet supported, Single extremity supported Sitting balance-Leahy Scale: Poor Sitting balance - Comments: UE support                                   ADL either performed or assessed with clinical judgement   ADL Overall ADL's : Needs assistance/impaired Eating/Feeding: NPO   Grooming: Sitting;Minimal assistance;Bed level   Upper Body Bathing: Moderate assistance;Bed level   Lower Body Bathing: Maximal assistance   Upper Body Dressing : Moderate assistance   Lower Body Dressing: Total assistance   Toilet Transfer: +2 for physical assistance;Total assistance   Toileting- Clothing Manipulation and Hygiene: Total assistance;Bed level       Functional mobility during ADLs: Total assistance;+2 for physical assistance       Vision   Additional Comments: will further assess     Perception Perception: Not tested       Praxis Praxis: Not tested       Pertinent Vitals/Pain Pain Assessment Pain Assessment: Faces Pain Score: 8  Faces Pain Scale: Hurts whole lot Pain Location: lt shoulder Pain Descriptors /  Indicators: Grimacing, Guarding Pain Intervention(s): Limited activity within patient's tolerance, Monitored during session, Repositioned     Extremity/Trunk Assessment Upper Extremity Assessment Upper Extremity Assessment: Generalized weakness;RUE deficits/detail;LUE deficits/detail RUE Deficits / Details: globally  weak, WFL for basic tasks LUE Deficits / Details: residual deficits from CVA and from "twisting" his shoulder a few weeks ago, can passively range shoulder ~60*, keeps elbow ~90* flexion, cannot extend elbow fully, can grasp with digits 1-3, unable to flex digits 4 and 5 LUE: Shoulder pain with ROM;Shoulder pain at rest LUE Coordination: decreased fine motor;decreased gross motor   Lower Extremity Assessment Lower Extremity Assessment: Defer to PT evaluation LLE Deficits / Details: Pt with knee flexion contracture. Limited movement which pt reports has worsened since fall 2 weeks ago       Communication Communication Communication: Difficulty communicating thoughts/reduced clarity of speech   Cognition Arousal: Alert Behavior During Therapy: WFL for tasks assessed/performed Overall Cognitive Status: No family/caregiver present to determine baseline cognitive functioning                                 General Comments: Very poor historian. Disoriented to time, situation, aware he is at [facility], initially states it is Feb 2014     General Comments  VSS on 2L o2    Exercises     Shoulder Instructions      Home Living Family/patient expects to be discharged to:: Private residence Living Arrangements: Non-relatives/Friends Available Help at Discharge: Friend(s);Available 24 hours/day Type of Home: House       Home Layout: Two level;Able to live on main level with bedroom/bathroom     Bathroom Shower/Tub: Chief Strategy Officer: Standard     Home Equipment: Rollator (4 wheels);Cane - single point;BSC/3in1;Shower seat;Wheelchair - manual   Additional Comments: Pt poor historian with difficulty conveying information      Prior Functioning/Environment Prior Level of Function : Needs assist       Physical Assist : Mobility (physical);ADLs (physical) Mobility (physical): Bed mobility;Transfers   Mobility Comments: Pt reports his friends pick him up  and put him in the w/c and take him wherever he needs to go          OT Problem List: Decreased strength;Decreased range of motion;Decreased activity tolerance;Impaired balance (sitting and/or standing);Decreased cognition;Decreased coordination;Cardiopulmonary status limiting activity      OT Treatment/Interventions:      OT Goals(Current goals can be found in the care plan section) Acute Rehab OT Goals Patient Stated Goal: none stated OT Goal Formulation: With patient Time For Goal Achievement: 09/30/22 Potential to Achieve Goals: Good ADL Goals Pt Will Perform Grooming: with set-up;bed level;sitting Pt Will Perform Upper Body Dressing: with contact guard assist;sitting;bed level Additional ADL Goal #1: pt will perform bed mobility mod A in prep for seated ADLs  OT Frequency: Min 1X/week    Co-evaluation PT/OT/SLP Co-Evaluation/Treatment: Yes Reason for Co-Treatment: Complexity of the patient's impairments (multi-system involvement);Necessary to address cognition/behavior during functional activity;For patient/therapist safety;To address functional/ADL transfers   OT goals addressed during session: ADL's and self-care      AM-PAC OT "6 Clicks" Daily Activity     Outcome Measure Help from another person eating meals?: A Little Help from another person taking care of personal grooming?: A Little Help from another person toileting, which includes using toliet, bedpan, or urinal?: A Lot Help from another person bathing (including washing, rinsing, drying)?: A Lot Help from  another person to put on and taking off regular upper body clothing?: A Lot Help from another person to put on and taking off regular lower body clothing?: Total 6 Click Score: 13   End of Session Equipment Utilized During Treatment: Oxygen (2L) Nurse Communication: Mobility status  Activity Tolerance: Patient tolerated treatment well Patient left: in bed;with call bell/phone within reach;with bed alarm  set  OT Visit Diagnosis: Unsteadiness on feet (R26.81);Other abnormalities of gait and mobility (R26.89);Muscle weakness (generalized) (M62.81)                Time: 1350-1415 OT Time Calculation (min): 25 min Charges:  OT General Charges $OT Visit: 1 Visit OT Evaluation $OT Eval Moderate Complexity: 1 Mod  Towanda Hornstein K, OTD, OTR/L SecureChat Preferred Acute Rehab [phone]   Carver Fila Koonce 09/16/2022, 4:24 PM

## 2022-09-16 NOTE — Assessment & Plan Note (Signed)
Pt requires a renal-diet, but has signs of muscle wasting and inadequate food intake. - Evaluate for food insecurity - Nutrition consult placed

## 2022-09-16 NOTE — Assessment & Plan Note (Addendum)
Diet controlled. Hypoglycemia yesterday to 50s.  Improved this a.m. to the 100s. -CBGs q4h -avoid insulin

## 2022-09-16 NOTE — Assessment & Plan Note (Signed)
Rates controlled. - apixaban 5 mg BID

## 2022-09-16 NOTE — Transfer of Care (Signed)
Immediate Anesthesia Transfer of Care Note  Patient: Nathaniel White  Procedure(s) Performed: PENILE DEBRIDMENT (Penis)  Patient Location: PACU  Anesthesia Type:General  Level of Consciousness: drowsy and responds to stimulation  Airway & Oxygen Therapy: Patient Spontanous Breathing and Patient connected to nasal cannula oxygen  Post-op Assessment: Report given to RN and Post -op Vital signs reviewed and stable  Post vital signs: Reviewed and stable  Last Vitals:  Vitals Value Taken Time  BP 78/42 09/16/22 1706  Temp    Pulse 62 09/16/22 1709  Resp 20 09/16/22 1709  SpO2 100 % 09/16/22 1709  Vitals shown include unfiled device data.  Last Pain:  Vitals:   09/16/22 1517  TempSrc: Oral  PainSc: 0-No pain         Complications: No notable events documented.

## 2022-09-16 NOTE — Assessment & Plan Note (Addendum)
TTS schedule. Received HD ON with 2.3L taken off.  Very soft blood pressures during HD but seem to be somewhat improved now.  Received dose of albumin and midodrine during HD. - Continue midodrine 10 mg 3 times daily - Renal function panel TTS - Consult to nephro - Avoid nephrotoxic drugs - sucroferric oxyhydroxide 1000mg  BID w meals

## 2022-09-16 NOTE — Anesthesia Preprocedure Evaluation (Addendum)
Anesthesia Evaluation  Patient identified by MRN, date of birth, ID band Patient awake    Reviewed: Allergy & Precautions, NPO status , Patient's Chart, lab work & pertinent test results  Airway Mallampati: II  TM Distance: >3 FB Neck ROM: Full    Dental  (+) Dental Advisory Given, Poor Dentition, Missing, Loose,    Pulmonary asthma , COPD,  COPD inhaler and oxygen dependent, former smoker   Pulmonary exam normal breath sounds clear to auscultation       Cardiovascular hypertension, Pt. on medications + Peripheral Vascular Disease and +CHF  + dysrhythmias Atrial Fibrillation + Valvular Problems/Murmurs MR  Rhythm:Regular Rate:Normal + Systolic murmurs Echo 06/19/22:  1. Left ventricular ejection fraction, by estimation, is 25 to 30%. The  left ventricle has severely decreased function. The left ventricle has no  regional wall motion abnormalities. Left ventricular diastolic function  could not be evaluated.   2. Right ventricular systolic function is severely reduced. The right  ventricular size is moderately enlarged.   3. Left atrial size was severely dilated.   4. Right atrial size was mildly dilated.   5. The mitral valve is abnormal. Moderate to severe mitral valve  regurgitation due to poor leaflet coaptation and a restricted posterior  leaflet. By quantitative measures, MR is severe with an EROA of 0.54cm2  and regurgitant volume of 72ml.   6. The aortic valve is normal in structure. Aortic valve regurgitation is  not visualized. No aortic stenosis is present.   7. The inferior vena cava is normal in size with greater than 50%  respiratory variability, suggesting right atrial pressure of 3 mmHg     Neuro/Psych  PSYCHIATRIC DISORDERS Anxiety     CVA    GI/Hepatic negative GI ROS, Neg liver ROS,,,  Endo/Other  diabetes, Type 2    Renal/GU ESRF and DialysisRenal disease (TTHSAT)     Musculoskeletal negative  musculoskeletal ROS (+)    Abdominal   Peds  Hematology  (+) Blood dyscrasia (Eliquis), anemia   Anesthesia Other Findings Day of surgery medications reviewed with the patient.  Reproductive/Obstetrics                             Anesthesia Physical Anesthesia Plan  ASA: 4  Anesthesia Plan: General   Post-op Pain Management: Ofirmev IV (intra-op)* and Ketamine IV*   Induction: Intravenous  PONV Risk Score and Plan: 2 and Dexamethasone and Ondansetron  Airway Management Planned: Oral ETT  Additional Equipment:   Intra-op Plan:   Post-operative Plan: Extubation in OR  Informed Consent: I have reviewed the patients History and Physical, chart, labs and discussed the procedure including the risks, benefits and alternatives for the proposed anesthesia with the patient or authorized representative who has indicated his/her understanding and acceptance.     Dental advisory given  Plan Discussed with: CRNA  Anesthesia Plan Comments:         Anesthesia Quick Evaluation

## 2022-09-16 NOTE — Progress Notes (Addendum)
IVT consult for PIV insertion. Attempted x 2 with no success utilizing U/S. Able to cannulate the vessel, however, veins blew. Very poor vasculature. L arm restriction. Primary RN aware. Recommend central access at this time.

## 2022-09-16 NOTE — Progress Notes (Signed)
Received patient in bed to unit.  Alert and oriented.  Informed consent signed and in chart.   TX duration:3.5   Patient tolerated well.  Transported back to the room  Alert, without acute distress.  Hand-off given to patient's nurse.   Access used: dialysis cath Access issues: none  Total UF removed: 2300 Medication(s) given: albumin , heplock 2.1 cc per port Post HD VS: see table below Post HD weight: unable to weight bed scale error    09/16/22 0114  Vitals  Temp 98.9 F (37.2 C)  BP (!) 106/55  MAP (mmHg) 68  BP Location Right Arm  BP Method Automatic  Patient Position (if appropriate) Lying  Pulse Rate Source Monitor  ECG Heart Rate 93  Resp (!) 21  Oxygen Therapy  SpO2 94 %  O2 Device Nasal Cannula  O2 Flow Rate (L/min) 3 L/min  Patient Activity (if Appropriate) In bed  Pulse Oximetry Type Continuous  During Treatment Monitoring  Blood Flow Rate (mL/min) 0 mL/min  Arterial Pressure (mmHg) -0.8 mmHg  Venous Pressure (mmHg) -0.4 mmHg  TMP (mmHg) 10.1 mmHg  Ultrafiltration Rate (mL/min) 0 mL/min  Dialysate Flow Rate (mL/min) 0 ml/min  Duration of HD Treatment -hour(s) 3.5 hour(s)  Cumulative Fluid Removed (mL) per Treatment  2347.34  Intra-Hemodialysis Comments Tolerated well  Post Treatment  Dialyzer Clearance Lightly streaked  Hemodialysis Intake (mL) 400 mL  Liters Processed 84  Fluid Removed (mL) 2300 mL  Tolerated HD Treatment Yes  Hemodialysis Catheter Left Subclavian Double lumen Permanent (Tunneled)  Placement Date/Time: 06/20/21 0851   Placed prior to admission: No  Time Out: Correct patient;Correct site;Correct procedure  Maximum sterile barrier precautions: Hand hygiene;Cap;Mask;Sterile gown;Sterile gloves;Large sterile sheet  Site Prep: (c) Ot...  Site Condition No complications  Blue Lumen Status Flushed;Heparin locked;Dead end cap in place  Red Lumen Status Flushed;Heparin locked;Dead end cap in place  Purple Lumen Status N/A   Catheter fill solution Heparin 1000 units/ml  Catheter fill volume (Arterial) 2.1 cc  Catheter fill volume (Venous) 2.1  Dressing Type Transparent  Dressing Status Antimicrobial disc in place  Interventions New dressing  Drainage Description None  Dressing Change Due 09/23/22  Post treatment catheter status Capped and Clamped      [facility]

## 2022-09-16 NOTE — Assessment & Plan Note (Addendum)
1-2 weeks of reduced movement and inability to walk under his own power. Reports this has been the case since he fell and injured his L knee.  Does have left-sided weakness noted throughout his chart, unsure how acute this reduced movement is. Infectious etiology vs. Deconditioning. -PT/OT -Consider head imaging if neuro exam changes -Consider palliative consultation

## 2022-09-16 NOTE — Consult Note (Signed)
WOC Nurse Consult Note: Reason for Consult: penile necrosis Reviewed chart and images Patient to be taken to OR by urology today for penile debridement Will not consult for that reason, they will manage topical care after debridement based on needs  Re consult if needed, will not follow at this time. Thanks  Tomoko Sandra [employer], RN,CWOCN, CNS, CWON-AP [phone])

## 2022-09-16 NOTE — Assessment & Plan Note (Addendum)
CXR with atelectasis v. Infection.  Chronically on 2 L of oxygen at baseline. - Avoid fluid administration unless necessary

## 2022-09-16 NOTE — Consult Note (Addendum)
Subjective: Patient reports no pain this morning. He is afebrile. Had dialysis yesterday and feels at his baseline.  Continued penile purulence and tenderness to palpation with new areas of glans necrosis (see media tab). Discussed with patient that bedside debridement probably will not be well tolerated, and for maximal infection control will proceed to OR.   Objective: Vital signs in last 24 hours: Temp:  [97.8 F (36.6 C)-98.9 F (37.2 C)] 98.2 F (36.8 C) (09/08 0710) Pulse Rate:  [29-142] 71 (09/08 0710) Resp:  [16-25] 20 (09/08 0710) BP: (67-117)/(46-77) 98/52 (09/08 0710) SpO2:  [74 %-100 %] 99 % (09/08 0758) Weight:  [69.6 kg] 69.6 kg (09/08 0128)  Assessment/Plan:  This is a 62 y.o. male with a history of asthma, ESRD on dialysis (history of prior failed transplant), COPD, diabetes, esophageal varices, stroke with residual hemiparesis, HTN, HFrEF, pAfib on eliquis who presented with weakness x2 weeks. He had significant penile necrosis and purulence on arrival.  #penile necrosis/purulence - Bedside debridement would be too extensive and painful for patient.  - Findings concerning for penile calciphylaxis, which is a very poor prognostic sign. Agree with consult to palliative care.  - Plan for OR today for penile debridement. Patient agrees to proceed. - Please keep NPO - Continue vanc/cefepime/flagyl/doxycyline at this time - No foley indicated  Intake/Output from previous day: 09/07 0701 - 09/08 0700 In: 678.1 [P.O.:240; IV Piggyback:438.1] Out: 2300   Intake/Output this shift: No intake/output data recorded.  Physical Exam:  General: Alert and oriented CV: No cyanosis Lungs: equal chest rise Abdomen: Soft, NTND, no rebound or guarding Skin: Gu: Penile glans necrosis progressed from yesterday with persistent purulence within glans base. Tender to palpation.   Lab Results: Recent Labs    09/15/22 0846 09/15/22 0904 09/16/22 0217  HGB 11.7* 13.6 11.8*   HCT 38.9* 40.0 38.0*   BMET Recent Labs    09/15/22 0846 09/15/22 0904 09/16/22 0217  NA 134* 135 132*  K 4.7 4.7 3.6  CL 93* 97* 93*  CO2 25  --  22  GLUCOSE 82 80 77  BUN 45* 41* 21*  CREATININE 9.40* 9.50* 4.95*  CALCIUM 9.2  --  8.9     Studies/Results: MR KNEE LEFT WO CONTRAST  Result Date: 09/15/2022 CLINICAL DATA:  Soft tissue infection suspected, knee, xray done Concern for knee infection, ESRD patient. Pain EXAM: MRI OF THE LEFT KNEE WITHOUT CONTRAST TECHNIQUE: Multiplanar, multisequence MR imaging of the knee was attempted. No intravenous contrast was administered. COMPARISON:  X-ray 09/15/2022 FINDINGS: Limited, incomplete, essentially nondiagnostic MRI of the left knee. Only 3 sequences were able to be obtained, each of which is severely degraded by motion artifact. The joint was imaged in 90 degrees of flexion resulting in nonstandard planes. No evidence to suggest a fracture. There is a small knee joint effusion. Assessment of the meniscal and ligamentous structures is unable to be performed on this exam. No soft tissue swelling or fluid collections. There is muscle atrophy. Extensor mechanism appears intact. IMPRESSION: Limited, incomplete, essentially nondiagnostic MRI of the left knee. See above. Electronically Signed   By: Duanne Guess D.O.   On: 09/15/2022 19:00   CT ABDOMEN PELVIS W CONTRAST  Result Date: 09/15/2022 CLINICAL DATA:  Acute abdominal pain. Cellulitis and clinical suspicion for Fournier's gangrene. EXAM: CT ABDOMEN AND PELVIS WITH CONTRAST TECHNIQUE: Multidetector CT imaging of the abdomen and pelvis was performed using the standard protocol following bolus administration of intravenous contrast. RADIATION DOSE REDUCTION:  This exam was performed according to the departmental dose-optimization program which includes automated exposure control, adjustment of the mA and/or kV according to patient size and/or use of iterative reconstruction technique.  CONTRAST:  75mL OMNIPAQUE IOHEXOL 350 MG/ML SOLN COMPARISON:  06/21/2021 FINDINGS: Lower Chest: No acute findings. Bibasilar pleural-parenchymal scarring again noted. Hepatobiliary: No suspicious hepatic masses identified. Gallstones are seen, however there is no evidence of cholecystitis or biliary dilatation. Pancreas:  No mass or inflammatory changes. Spleen: Within normal limits in size and appearance. Adrenals/Urinary Tract: Normal adrenal glands. Diffuse bilateral renal involvement by multiple calcified lesions again seen. These show no significant change compared to previous study, consistent with benign etiology, likely representing calcified and proteinaceous renal cysts. These findings may be due to polycystic kidney disease or chronic 6 disease of dialysis. Diffusely calcified renal transplant is again seen in the right iliac fossa. No evidence of ureteral calculi or hydronephrosis. Urinary bladder is empty. Stomach/Bowel: No evidence of obstruction, inflammatory process or abnormal fluid collections. Diverticulosis is seen mainly involving the descending and sigmoid colon, however there is no evidence of diverticulitis. Normal appendix visualized. A small right lower quadrant ventral hernia is again seen containing loop of small bowel. No evidence of bowel obstruction or strangulation. Vascular/Lymphatic: No pathologically enlarged lymph nodes. No acute vascular findings. Reproductive:  No mass or other significant abnormality. Other: None. No evidence of inflammatory process or soft tissue gas in the abdominal or pelvic wall soft tissues. Musculoskeletal:  No suspicious bone lesions identified. IMPRESSION: No acute findings within the abdomen or pelvis. No radiographic evidence of body wall cellulitis or soft tissue gas. Stable calcified bilateral renal lesions, consistent with benign etiology, likely calcified and proteinaceous renal cysts. This could be due to acquired cystic disease of dialysis or  polycystic kidney disease. Diffusely calcified renal transplant in right iliac fossa. Colonic diverticulosis, without radiographic evidence of diverticulitis. Cholelithiasis. No radiographic evidence of cholecystitis. Small right lower quadrant ventral hernia containing loop of small bowel. No evidence of bowel obstruction or strangulation. Electronically Signed   By: Danae Orleans M.D.   On: 09/15/2022 10:55   DG Knee Complete 4 Views Left  Result Date: 09/15/2022 CLINICAL DATA:  Left knee pain EXAM: LEFT KNEE - COMPLETE 4+ VIEW COMPARISON:  None Available. FINDINGS: No evidence of fracture, dislocation, or joint effusion. Vascular calcifications and a vascular stent are noted. No radiopaque foreign body. Tricompartmental mild degenerative changes. IMPRESSION: 1. No fracture or dislocation. Note that the degree of osteopenia slightly limits assessment. 2.  Mild tricompartmental degenerative changes Electronically Signed   By: Lorenza Cambridge M.D.   On: 09/15/2022 09:40   DG Chest Port 1 View  Result Date: 09/15/2022 CLINICAL DATA:  weakness EXAM: PORTABLE CHEST 1 VIEW COMPARISON:  CXR 07/14/22 FINDINGS: Left-sided central venous catheter with the tip in the right atrium, unchanged. Mild cardiomegaly. Small right pleural effusion, unchanged. No pneumothorax. Compared to prior exam there is increased hazy opacity at the right lung base, which could represent atelectasis or infection. No radiographically apparent displaced rib fractures. Visualized upper abdomen is unremarkable. IMPRESSION: 1. Increased hazy opacity at the right lung base, which could represent atelectasis or infection. 2. Small right pleural effusion, unchanged. Electronically Signed   By: Lorenza Cambridge M.D.   On: 09/15/2022 09:37      LOS: 1 day   Cathren Harsh, MD [facility] Resident  [facility]   Dr. Liliane Shi is the attending of record for this consult.    09/16/2022, 11:45 AM

## 2022-09-16 NOTE — Evaluation (Signed)
Physical Therapy Evaluation Patient Details Name: Nathaniel White MRN: [id number] DOB: Dec 08, 1960 Today's Date: 09/16/2022  History of Present Illness  Pt is 62 year old presented to [facility] on  09/15/22 for sepsis due to penile infection.Urology with bedside debridement on 9/7 and taking to OR for debridement on 9/8. 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 Pt also with painful lt knee wound from fall several weeks ago. PMH - stroke (residual L side weakness), ESRD (HD TTS), HTN, PVD, COPD, CHF, asthma, anxiety.  Clinical Impression  Pt poor historian making it difficult to understand the timeline for his decline in function. From what he says it sounds like he has friends who have been picking him up and putting him in a w/c or a chair. He had a fall 2 weeks ago that has been causing him lt knee pain and he says this has worsened mobility but he also says he hasn't been able to stand in over a year. Patient will benefit from continued inpatient follow up therapy, <3 hours/day unless friends plan to continue to provide total care including transfers.          If plan is discharge home, recommend the following: Two people to help with walking and/or transfers;A lot of help with bathing/dressing/bathroom;Assist for transportation;Direct supervision/assist for medications management   Can travel by private vehicle   No    Equipment Recommendations Hoyer lift  Recommendations for Other Services       Functional Status Assessment Patient has had a recent decline in their functional status and/or demonstrates limited ability to make significant improvements in function in a reasonable and predictable amount of time     Precautions / Restrictions Precautions Precautions: Fall      Mobility  Bed Mobility Overal bed mobility: Needs Assistance Bed Mobility: Rolling, Supine to Sit, Sit to Supine Rolling: Total assist   Supine to sit: +2 for physical assistance, Total assist Sit to  supine: +2 for physical assistance, Total assist   General bed mobility comments: Assist for all aspects    Transfers                   General transfer comment: Did not attempt    Ambulation/Gait                  Stairs            Wheelchair Mobility     Tilt Bed    Modified Rankin (Stroke Patients Only)       Balance Overall balance assessment: Needs assistance Sitting-balance support: Feet supported, Single extremity supported Sitting balance-Leahy Scale: Poor Sitting balance - Comments: UE support                                     Pertinent Vitals/Pain Pain Assessment Pain Assessment: Faces Faces Pain Scale: Hurts whole lot Pain Location: lt shoulder Pain Descriptors / Indicators: Grimacing, Guarding Pain Intervention(s): Limited activity within patient's tolerance, Monitored during session, Repositioned    Home Living Family/patient expects to be discharged to:: Private residence Living Arrangements: Non-relatives/Friends Available Help at Discharge: Friend(s);Available 24 hours/day Type of Home: House         Home Layout: Two level;Able to live on main level with bedroom/bathroom Home Equipment: Rollator (4 wheels);Cane - single point;BSC/3in1;Shower seat;Wheelchair - manual Additional Comments: Pt poor historian with difficulty conveying information    Prior Function Prior Level of  Function : Needs assist       Physical Assist : Mobility (physical);ADLs (physical) Mobility (physical): Bed mobility;Transfers   Mobility Comments: Pt reports his friends pick him up and put him in the w/c and take him wherever he needs to go       Extremity/Trunk Assessment   Upper Extremity Assessment Upper Extremity Assessment: Defer to OT evaluation    Lower Extremity Assessment Lower Extremity Assessment: Generalized weakness;LLE deficits/detail LLE Deficits / Details: Pt with knee flexion contracture. Limited  movement which pt reports has worsened since fall 2 weeks ago       Communication   Communication Communication: Difficulty communicating thoughts/reduced clarity of speech  Cognition Arousal: Alert Behavior During Therapy: WFL for tasks assessed/performed Overall Cognitive Status: No family/caregiver present to determine baseline cognitive functioning                                 General Comments: Very poor historian.        General Comments      Exercises     Assessment/Plan    PT Assessment Patient needs continued PT services  PT Problem List Decreased strength;Decreased range of motion;Decreased activity tolerance;Decreased balance;Decreased mobility;Decreased cognition;Pain       PT Treatment Interventions DME instruction;Functional mobility training;Therapeutic activities;Therapeutic exercise;Balance training;Patient/family education    PT Goals (Current goals can be found in the Care Plan section)  Acute Rehab PT Goals Patient Stated Goal: return home PT Goal Formulation: With patient Time For Goal Achievement: 09/30/22 Potential to Achieve Goals: Fair    Frequency Min 1X/week     Co-evaluation               AM-PAC PT "6 Clicks" Mobility  Outcome Measure Help needed turning from your back to your side while in a flat bed without using bedrails?: Total Help needed moving from lying on your back to sitting on the side of a flat bed without using bedrails?: Total Help needed moving to and from a bed to a chair (including a wheelchair)?: Total Help needed standing up from a chair using your arms (e.g., wheelchair or bedside chair)?: Total Help needed to walk in hospital room?: Total Help needed climbing 3-5 steps with a railing? : Total 6 Click Score: 6    End of Session Equipment Utilized During Treatment: Oxygen Activity Tolerance: Patient limited by pain Patient left: in bed;with call bell/phone within reach;with bed alarm set    PT Visit Diagnosis: Other abnormalities of gait and mobility (R26.89);Muscle weakness (generalized) (M62.81);History of falling (Z91.81);Hemiplegia and hemiparesis;Pain Hemiplegia - Right/Left: Left Hemiplegia - dominant/non-dominant: Dominant Pain - Right/Left: Left Pain - part of body: Shoulder    Time: 1353-1416 PT Time Calculation (min) (ACUTE ONLY): 23 min   Charges:   PT Evaluation $PT Eval Moderate Complexity: 1 Mod   PT General Charges $$ ACUTE PT VISIT: 1 Visit         [facility] PT Acute Rehabilitation Services Office [phone]   Angelina Ok [facility] 09/16/2022, 3:21 PM

## 2022-09-16 NOTE — Progress Notes (Addendum)
[facility] Progress Note   Subjective:    Seen and examined patient at bedside. Tolerated yesterday's HD with net UF 2.3L. Urology following for concern for penile calciphylaxis. ABXs on board and STD testing pending. Plan for debridement in the OR today.   Objective Vitals:   09/16/22 0600 09/16/22 0710 09/16/22 0758 09/16/22 1216  BP:  (!) 98/52  (!) 103/54  Pulse: 74 71  75  Resp: 19 20  18   Temp:  98.2 F (36.8 C)  98 F (36.7 C)  TempSrc:  Oral  Oral  SpO2: 98% 97% 99% 99%  Weight:      Height:       Physical Exam General: Awake, alert, NAD, on 2L [place] [facility]: S1 and S2; No murmurs, gallops, or rubs Lungs: Clear anteriorly, diminished at BL bases Abdomen: Soft and non-tender Extremities: Discolored, trace BL LE edema Dialysis Access: [facility]   Filed Weights   09/15/22 0812 09/16/22 0128  Weight: 72.6 kg 69.6 kg    Intake/Output Summary (Last 24 hours) at 09/16/2022 1332 Last data filed at 09/16/2022 0230 Gross per 24 hour  Intake 678.07 ml  Output 2300 ml  Net -1621.93 ml    Additional Objective Labs: Basic Metabolic Panel: Recent Labs  Lab 09/15/22 0846 09/15/22 0904 09/16/22 0217  NA 134* 135 132*  K 4.7 4.7 3.6  CL 93* 97* 93*  CO2 25  --  22  GLUCOSE 82 80 77  BUN 45* 41* 21*  CREATININE 9.40* 9.50* 4.95*  CALCIUM 9.2  --  8.9  PHOS  --   --  3.4   Liver Function Tests: Recent Labs  Lab 09/15/22 0846 09/16/22 0217  AST 40  --   ALT 21  --   ALKPHOS 142*  --   BILITOT 1.8*  --   PROT 7.4  --   ALBUMIN 1.8* 2.3*   No results for input(s): "LIPASE", "AMYLASE" in the last 168 hours. CBC: Recent Labs  Lab 09/15/22 0846 09/15/22 0904 09/16/22 0217  WBC 11.4*  --  13.0*  NEUTROABS 9.7*  --   --   HGB 11.7* 13.6 11.8*  HCT 38.9* 40.0 38.0*  MCV 88.8  --  86.6  PLT 190  --  160   Blood Culture    Component Value Date/Time   SDES BLOOD RIGHT HAND 09/15/2022 0855   SPECREQUEST  09/15/2022 0855    BOTTLES DRAWN AEROBIC AND  ANAEROBIC Blood Culture results may not be optimal due to an inadequate volume of blood received in culture bottles   CULT  09/15/2022 0855    NO GROWTH < 24 HOURS Performed at [facility], [address], [place], [place] [address]    REPTSTATUS PENDING 09/15/2022 [phone]    Cardiac Enzymes: No results for input(s): "CKTOTAL", "CKMB", "CKMBINDEX", "TROPONINI" in the last 168 hours. CBG: Recent Labs  Lab 09/15/22 2035 09/16/22 0208 09/16/22 0502 09/16/22 0814 09/16/22 1215  GLUCAP 74 74 114* 106* 90   Iron Studies: No results for input(s): "IRON", "TIBC", "TRANSFERRIN", "FERRITIN" in the last 72 hours. Lab Results  Component Value Date   INR 1.2 06/20/2021   INR 1.4 (H) 06/20/2021   INR 1.0 10/08/2013   Studies/Results: MR KNEE LEFT WO CONTRAST  Result Date: 09/15/2022 CLINICAL DATA:  Soft tissue infection suspected, knee, xray done Concern for knee infection, ESRD patient. Pain EXAM: MRI OF THE LEFT KNEE WITHOUT CONTRAST TECHNIQUE: Multiplanar, multisequence MR imaging of the knee was attempted. No intravenous  contrast was administered. COMPARISON:  X-ray 09/15/2022 FINDINGS: Limited, incomplete, essentially nondiagnostic MRI of the left knee. Only 3 sequences were able to be obtained, each of which is severely degraded by motion artifact. The joint was imaged in 90 degrees of flexion resulting in nonstandard planes. No evidence to suggest a fracture. There is a small knee joint effusion. Assessment of the meniscal and ligamentous structures is unable to be performed on this exam. No soft tissue swelling or fluid collections. There is muscle atrophy. Extensor mechanism appears intact. IMPRESSION: Limited, incomplete, essentially nondiagnostic MRI of the left knee. See above. Electronically Signed   By: Duanne Guess D.O.   On: 09/15/2022 19:00   CT ABDOMEN PELVIS W CONTRAST  Result Date: 09/15/2022 CLINICAL DATA:  Acute abdominal pain. Cellulitis and clinical suspicion for  Fournier's gangrene. EXAM: CT ABDOMEN AND PELVIS WITH CONTRAST TECHNIQUE: Multidetector CT imaging of the abdomen and pelvis was performed using the standard protocol following bolus administration of intravenous contrast. RADIATION DOSE REDUCTION: This exam was performed according to the departmental dose-optimization program which includes automated exposure control, adjustment of the mA and/or kV according to patient size and/or use of iterative reconstruction technique. CONTRAST:  75mL OMNIPAQUE IOHEXOL 350 MG/ML SOLN COMPARISON:  06/21/2021 FINDINGS: Lower Chest: No acute findings. Bibasilar pleural-parenchymal scarring again noted. Hepatobiliary: No suspicious hepatic masses identified. Gallstones are seen, however there is no evidence of cholecystitis or biliary dilatation. Pancreas:  No mass or inflammatory changes. Spleen: Within normal limits in size and appearance. Adrenals/Urinary Tract: Normal adrenal glands. Diffuse bilateral renal involvement by multiple calcified lesions again seen. These show no significant change compared to previous study, consistent with benign etiology, likely representing calcified and proteinaceous renal cysts. These findings may be due to polycystic kidney disease or chronic 6 disease of dialysis. Diffusely calcified renal transplant is again seen in the right iliac fossa. No evidence of ureteral calculi or hydronephrosis. Urinary bladder is empty. Stomach/Bowel: No evidence of obstruction, inflammatory process or abnormal fluid collections. Diverticulosis is seen mainly involving the descending and sigmoid colon, however there is no evidence of diverticulitis. Normal appendix visualized. A small right lower quadrant ventral hernia is again seen containing loop of small bowel. No evidence of bowel obstruction or strangulation. Vascular/Lymphatic: No pathologically enlarged lymph nodes. No acute vascular findings. Reproductive:  No mass or other significant abnormality. Other:  None. No evidence of inflammatory process or soft tissue gas in the abdominal or pelvic wall soft tissues. Musculoskeletal:  No suspicious bone lesions identified. IMPRESSION: No acute findings within the abdomen or pelvis. No radiographic evidence of body wall cellulitis or soft tissue gas. Stable calcified bilateral renal lesions, consistent with benign etiology, likely calcified and proteinaceous renal cysts. This could be due to acquired cystic disease of dialysis or polycystic kidney disease. Diffusely calcified renal transplant in right iliac fossa. Colonic diverticulosis, without radiographic evidence of diverticulitis. Cholelithiasis. No radiographic evidence of cholecystitis. Small right lower quadrant ventral hernia containing loop of small bowel. No evidence of bowel obstruction or strangulation. Electronically Signed   By: Danae Orleans M.D.   On: 09/15/2022 10:55   DG Knee Complete 4 Views Left  Result Date: 09/15/2022 CLINICAL DATA:  Left knee pain EXAM: LEFT KNEE - COMPLETE 4+ VIEW COMPARISON:  None Available. FINDINGS: No evidence of fracture, dislocation, or joint effusion. Vascular calcifications and a vascular stent are noted. No radiopaque foreign body. Tricompartmental mild degenerative changes. IMPRESSION: 1. No fracture or dislocation. Note that the degree of osteopenia slightly limits  assessment. 2.  Mild tricompartmental degenerative changes Electronically Signed   By: Lorenza Cambridge M.D.   On: 09/15/2022 09:40   DG Chest Port 1 View  Result Date: 09/15/2022 CLINICAL DATA:  weakness EXAM: PORTABLE CHEST 1 VIEW COMPARISON:  CXR 07/14/22 FINDINGS: Left-sided central venous catheter with the tip in the right atrium, unchanged. Mild cardiomegaly. Small right pleural effusion, unchanged. No pneumothorax. Compared to prior exam there is increased hazy opacity at the right lung base, which could represent atelectasis or infection. No radiographically apparent displaced rib fractures. Visualized  upper abdomen is unremarkable. IMPRESSION: 1. Increased hazy opacity at the right lung base, which could represent atelectasis or infection. 2. Small right pleural effusion, unchanged. Electronically Signed   By: Lorenza Cambridge M.D.   On: 09/15/2022 09:37    Medications:  ceFEPime (MAXIPIME) IV     doxycycline (VIBRAMYCIN) IV 100 mg (09/16/22 9604)   metronidazole 500 mg (09/16/22 0806)   [START ON 09/18/2022] vancomycin      amiodarone  200 mg Oral Daily   apixaban  5 mg Oral BID   Chlorhexidine Gluconate Cloth  6 each Topical Q0600   [START ON 09/17/2022] influenza vac split trivalent PF  0.5 mL Intramuscular Tomorrow-1000   midodrine  10 mg Oral TID WC   mometasone-formoterol  2 puff Inhalation BID   sucroferric oxyhydroxide  1,000 mg Oral BID PC    Dialysis Orders:  TTS SW  4h  450/ 500   67.5kg  2/2 bath  TDC   Heparin none - last OP HD 9/05, post wt 70.3kg - coming off 3-5kg over for last 3 wks - parsabiv 7.5mg  IV three times per week - no esa or vdra  Home meds include - albuterol, amiodarone, eliquis, advair diskus, midodrine 5mg  1- 2 on non-hd days, singulair, velphoro 1 gm ac tid, prns   Assessment/Plan:   Sepsis - abx per pmd Penile dermal necrosis - seen by urology. 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 Plan for debridement in the OR today.  ESRD - on HD TTS. Received HD overnight and 2.3L was removed. He still remains overloaded and consistently over his EDW at his outpatient HD center. Will plan for an extra HD tomorrow and again 9/10 to resume his usual schedule. Watch his Bps closely as they do tend to drop during treatment. Will adjust his Midodrine further if needed. HTN/ volume - cxr looks like pulm edema, pt coming off over dry wt the last 3 wks. Not edematous. BP's are not great, however. Is on midodrine non HD days at home. Midodrine recently raised to 10mg  tid for BP support. Will adjust further if needed. See above. Anemia esrd - Hb 11, no esa needs MBD ckd -  CCa in range, add on phos. Hold off on giving Ca or Vitamin D supplements for now given concern for calciphylaxis. H/o CVA GOC - poor prognosis, agree with Urology in proceeding with palliative care consult, will discuss with primary  Salome Holmes, NP [facility] 09/16/2022,1:32 PM  LOS: 1 day

## 2022-09-16 NOTE — Plan of Care (Signed)

## 2022-09-16 NOTE — Assessment & Plan Note (Signed)
Patient has open, draining wound on glans of his penis. Pt reports it has been discharging fluid for at least 2, possibly 4 weeks. Family members reported that he was having oral sex with a woman with poor dentition.  - HIV - GC/ Chlamydia Swab - RPR -Urology consulted, appreciate assistance - See abx as above.

## 2022-09-16 NOTE — Progress Notes (Signed)
FMTS Interim Progress Note  S: Went to evaluate patient when he returned to his room after his penile debridement and partial penectomy in the OR.  Patient has no complaints at this time.  O: BP (!) 94/57 (BP Location: Right Arm)   Pulse 68   Temp (!) 97.5 F (36.4 C) (Oral)   Resp 20   Ht 5\' 9"  (1.753 m)   Wt 69.6 kg   SpO2 92%   BMI 22.66 kg/m   Gen: no acute distress, resting in bed CV: RRR, no murmurs Resp: no increased work of breathing Penis dressed in bandage  A/P: Necrotic penile wound - Continue broad-spectrum antibiotics - cefepime, flagyl, vanc - Urology will perform first dressing exchange tomorrow morning. - tylenol 1000mg  q6h PRN - can give oxycodone 5 as needed  Alandra Sando, DO 09/16/2022, 6:28 PM PGY-1, [facility] Family Medicine Service pager [phone]

## 2022-09-16 NOTE — Anesthesia Procedure Notes (Addendum)
Procedure Name: Intubation Date/Time: 09/16/2022 4:17 PM  Performed by: Edmonia Caprio, CRNAPre-anesthesia Checklist: Patient identified, Emergency Drugs available, Suction available, Patient being monitored and Timeout performed Patient Re-evaluated:Patient Re-evaluated prior to induction Oxygen Delivery Method: Circle system utilized Preoxygenation: Pre-oxygenation with 100% oxygen Induction Type: IV induction and Rapid sequence Laryngoscope Size: Miller and 2 Grade View: Grade I Tube type: Oral Tube size: 7.5 mm Number of attempts: 1 Airway Equipment and Method: Stylet Placement Confirmation: positive ETCO2, ETT inserted through vocal cords under direct vision and breath sounds checked- equal and bilateral Secured at: 21 cm Tube secured with: Tape Dental Injury: Teeth and Oropharynx as per pre-operative assessment  Comments: Patient denied loose teeth on preop exam; upper front incisors clearly loose upon mouth opening after induction.  Unchanged after intubation.

## 2022-09-16 NOTE — Assessment & Plan Note (Signed)
Abrasion over the left knee with significant swelling and some warmth.  No gout history, maintaining broad differential with possibility of infectious/inflammatory/traumatic causes. Contracture on left side but worsened with knee injury. MR w/o contrast L knee nondiagnostic but no soft tissue swelling or fluid collections that were seen.  Small knee joint effusion. - Abx as above  - Consider orthopedics consultation if worsening

## 2022-09-16 NOTE — Progress Notes (Signed)
Called and spoke with HD nurse due to very low blood pressures in chart. . States that he has talked to nephrology physician about these BPs-goal initally was 3 L off but this was toned down to 2.5 L and ordered both albumin and midodrine for him. Patient remains hypotensive  but slightly improved 83/52. Patient is awake, alert and asymptomatic per nursing.

## 2022-09-16 NOTE — Op Note (Signed)
Operative Note  Preoperative diagnosis:  1.  Necrotic penile wound  Postoperative diagnosis: 1.  same  Procedure(s): 1.  Partial penectomy 2. Penile debridement  Surgeon: Rhoderick Moody, MD  Assistants:  Cathren Harsh, MD  Anesthesia:  General  Complications:  None  EBL:  5cc  Specimens: 1. Glans penis 2. Wound culture  Drains/Catheters: 1.  none  Intraoperative findings:   Minimal remaining glans tissue appeared devitalized and was sharply excised until adequate bleeding edges were identified.  Purulence tracked down to the corpora, excised all visible portions Urethra identified, not probed  Indication:   history of asthma, ESRD on dialysis (history of prior failed transplant), COPD, diabetes, esophageal varices, stroke with residual hemiparesis, HTN, HFrEF, pAfib on eliquis who presented with weakness x2 weeks. He had significant penile necrosis and purulence on arrival that was not amenable to bedside debridement.   Description of procedure:  The patient was brought to the OR and anesthesia was induced.  He was then prepped and draped in the usual sterile fashion.  A timeout was performed confirming patient name, MRN, and procedure to be performed.  Appropriate antibiotics had already been given on a scheduled basis on the floor.    10 cc of 2% lidocaine without epinephrine was applied as a dorsal and ring block.  We began by using Metzenbaum scissors to sharply excise the visibly purulent/necrotic tissue from the remaining glans skin.  At this point we noted multiple glans edges which were blanched and necrotic.  These were excised sharply to the coronal margin.  Wound culture swabs and tissue cultures were sent.  The purulence tracked deeper toward the corpora and was excised sharply until we identified bilateral corpora.  Bleeding was slow and minimal throughout the case and controlled with pinpoint electrocautery.  At this point we had excised the remainder of the  glans skin and noted healthy though slowly bleeding edges at the base of our debridement.  The area was irrigated.  We oversewed the bilateral corpora with 2 figure-of-eight sutures of 2-0 chromic.  Once hemostasis was confirmed the area was cleaned and dried and a wet-to-dry Kerlix dressing was applied followed by Kerlix fluffs and a full support.  The patient was awoken from anesthesia having tolerated the procedure well and was taken to the PACU for postoperative care.  Plan: Continue broad-spectrum antibiotics.  Urology to perform first dressing exchange tomorrow morning.

## 2022-09-17 ENCOUNTER — Inpatient Hospital Stay (HOSPITAL_COMMUNITY): Payer: Medicare Other

## 2022-09-17 ENCOUNTER — Encounter (HOSPITAL_COMMUNITY): Payer: Self-pay | Admitting: Urology

## 2022-09-17 DIAGNOSIS — A419 Sepsis, unspecified organism: Secondary | ICD-10-CM | POA: Diagnosis not present

## 2022-09-17 DIAGNOSIS — I440 Atrioventricular block, first degree: Secondary | ICD-10-CM | POA: Insufficient documentation

## 2022-09-17 DIAGNOSIS — S3120XA Unspecified open wound of penis, initial encounter: Secondary | ICD-10-CM | POA: Diagnosis not present

## 2022-09-17 LAB — GLUCOSE, CAPILLARY
Glucose-Capillary: 115 mg/dL — ABNORMAL HIGH (ref 70–99)
Glucose-Capillary: 115 mg/dL — ABNORMAL HIGH (ref 70–99)
Glucose-Capillary: 121 mg/dL — ABNORMAL HIGH (ref 70–99)
Glucose-Capillary: 131 mg/dL — ABNORMAL HIGH (ref 70–99)
Glucose-Capillary: 138 mg/dL — ABNORMAL HIGH (ref 70–99)

## 2022-09-17 LAB — RENAL FUNCTION PANEL
Albumin: 2.2 g/dL — ABNORMAL LOW (ref 3.5–5.0)
Anion gap: 17 — ABNORMAL HIGH (ref 5–15)
BUN: 29 mg/dL — ABNORMAL HIGH (ref 6–20)
CO2: 22 mmol/L (ref 22–32)
Calcium: 9.2 mg/dL (ref 8.9–10.3)
Chloride: 94 mmol/L — ABNORMAL LOW (ref 98–111)
Creatinine, Ser: 6.22 mg/dL — ABNORMAL HIGH (ref 0.61–1.24)
GFR, Estimated: 10 mL/min — ABNORMAL LOW (ref >60)
Glucose, Bld: 131 mg/dL — ABNORMAL HIGH (ref 70–99)
Phosphorus: 5.9 mg/dL — ABNORMAL HIGH (ref 2.5–4.6)
Potassium: 4.2 mmol/L (ref 3.5–5.1)
Sodium: 133 mmol/L — ABNORMAL LOW (ref 135–145)

## 2022-09-17 LAB — COMPREHENSIVE METABOLIC PANEL
ALT: 18 U/L (ref 0–44)
AST: 32 U/L (ref 15–41)
Albumin: 2.1 g/dL — ABNORMAL LOW (ref 3.5–5.0)
Alkaline Phosphatase: 110 U/L (ref 38–126)
Anion gap: 18 — ABNORMAL HIGH (ref 5–15)
BUN: 29 mg/dL — ABNORMAL HIGH (ref 6–20)
CO2: 23 mmol/L (ref 22–32)
Calcium: 9.4 mg/dL (ref 8.9–10.3)
Chloride: 93 mmol/L — ABNORMAL LOW (ref 98–111)
Creatinine, Ser: 6.33 mg/dL — ABNORMAL HIGH (ref 0.61–1.24)
GFR, Estimated: 9 mL/min — ABNORMAL LOW (ref >60)
Glucose, Bld: 137 mg/dL — ABNORMAL HIGH (ref 70–99)
Potassium: 4.3 mmol/L (ref 3.5–5.1)
Sodium: 134 mmol/L — ABNORMAL LOW (ref 135–145)
Total Bilirubin: 1.7 mg/dL — ABNORMAL HIGH (ref 0.3–1.2)
Total Protein: 7 g/dL (ref 6.5–8.1)

## 2022-09-17 LAB — CBC
HCT: 35.4 % — ABNORMAL LOW (ref 39.0–52.0)
Hemoglobin: 10.6 g/dL — ABNORMAL LOW (ref 13.0–17.0)
MCH: 26.7 pg (ref 26.0–34.0)
MCHC: 29.9 g/dL — ABNORMAL LOW (ref 30.0–36.0)
MCV: 89.2 fL (ref 80.0–100.0)
Platelets: 164 10*3/uL (ref 150–400)
RBC: 3.97 MIL/uL — ABNORMAL LOW (ref 4.22–5.81)
RDW: 18.7 % — ABNORMAL HIGH (ref 11.5–15.5)
WBC: 9.3 10*3/uL (ref 4.0–10.5)
nRBC: 0 % (ref 0.0–0.2)

## 2022-09-17 LAB — TROPONIN I (HIGH SENSITIVITY)
Troponin I (High Sensitivity): 108 ng/L (ref <18)
Troponin I (High Sensitivity): 100 ng/L (ref <18)

## 2022-09-17 LAB — GC/CHLAMYDIA PROBE AMP (~~LOC~~) NOT AT ARMC
Chlamydia: NEGATIVE
Comment: NEGATIVE
Comment: NORMAL
Neisseria Gonorrhea: NEGATIVE

## 2022-09-17 LAB — MAGNESIUM: Magnesium: 1.9 mg/dL (ref 1.7–2.4)

## 2022-09-17 LAB — TSH: TSH: 2.641 u[IU]/mL (ref 0.350–4.500)

## 2022-09-17 LAB — T.PALLIDUM AB, TOTAL: T Pallidum Abs: REACTIVE — AB

## 2022-09-17 MED ORDER — SODIUM CHLORIDE 0.9 % IV SOLN
2.0000 g | INTRAVENOUS | Status: DC
  Administered 2022-09-17: 2 g via INTRAVENOUS
  Filled 2022-09-17: qty 20

## 2022-09-17 MED ORDER — LIDOCAINE HCL (PF) 1 % IJ SOLN: 5.0000 mL | INTRAMUSCULAR | Status: DC | PRN

## 2022-09-17 MED ORDER — PENTAFLUOROPROP-TETRAFLUOROETH EX AERO: 1.0000 | INHALATION_SPRAY | CUTANEOUS | Status: DC | PRN

## 2022-09-17 MED ORDER — LIDOCAINE-PRILOCAINE 2.5-2.5 % EX CREA: 1.0000 | TOPICAL_CREAM | CUTANEOUS | Status: DC | PRN

## 2022-09-17 MED ORDER — HEPARIN SODIUM (PORCINE) 1000 UNIT/ML DIALYSIS
1000.0000 [IU] | INTRAMUSCULAR | Status: DC | PRN
  Administered 2022-09-17: 4200 [IU] via INTRAVENOUS_CENTRAL
  Filled 2022-09-17 (×2): qty 1

## 2022-09-17 MED ORDER — ALBUMIN HUMAN 25 % IV SOLN
25.0000 g | Freq: Once | INTRAVENOUS | Status: AC
  Administered 2022-09-17: 25 g via INTRAVENOUS
  Filled 2022-09-17: qty 100

## 2022-09-17 MED ORDER — ALTEPLASE 2 MG IJ SOLR: 2.0000 mg | Freq: Once | INTRAMUSCULAR | Status: DC | PRN

## 2022-09-17 NOTE — Assessment & Plan Note (Deleted)
CXR with atelectasis v. Infection.  Chronically on 2 L of oxygen at baseline. - Avoid fluid administration unless necessary

## 2022-09-17 NOTE — Assessment & Plan Note (Addendum)
1-2 weeks of subjective reduced movement and inability to walk. Reports this had been the case since he fell and injured his L knee.  Left-sided weakness noted throughout his chart, unsure how acute this reduced movement is. Infectious etiology vs. Deconditioning.  -PT/OT following, appreciate recommendations  -Considering head imaging if neuro exam changes -Considering palliative consultation

## 2022-09-17 NOTE — Procedures (Signed)
Central Venous Catheter Insertion Procedure Note  Nathaniel White  [id number]  [birth year]  Date:09/17/22  Time:1:29 AM   Provider Performing:Wataru Mccowen D Suzie Portela   Procedure: Insertion of Non-tunneled Central Venous [phone]) with US guidance ([id number])   Indication(s) Medication administration and Difficult access  Consent Risks of the procedure as well as the alternatives and risks of each were explained to the patient and/or caregiver.  Consent for the procedure was obtained and is signed in the bedside chart  Anesthesia Topical only with 1% lidocaine   Timeout Verified patient identification, verified procedure, site/side was marked, verified correct patient position, special equipment/implants available, medications/allergies/relevant history reviewed, required imaging and test results available.  Sterile Technique Maximal sterile technique including full sterile barrier drape, hand hygiene, sterile gown, sterile gloves, mask, hair covering, sterile ultrasound probe cover (if used).  Procedure Description Area of catheter insertion was cleaned with chlorhexidine and draped in sterile fashion.  With real-time ultrasound guidance a central venous catheter was placed into the left internal jugular vein. Nonpulsatile blood flow and easy flushing noted in all ports.  The catheter was sutured in place and sterile dressing applied.  Complications/Tolerance None; patient tolerated the procedure well. Chest X-ray is ordered to verify placement for internal jugular or subclavian cannulation.   Chest x-ray is not ordered for femoral cannulation.  EBL Minimal  Specimen(s) None  Nathaniel White [facility] 09/17/2022, 1:30 AM  Please see [email or web address] for pager details.  From 7A-7P if no response, please call [phone]. After hours, please call ELink [phone].

## 2022-09-17 NOTE — Anesthesia Postprocedure Evaluation (Signed)
Anesthesia Post Note  Patient: Nathaniel White  Procedure(s) Performed: PENILE DEBRIDMENT (Penis)     Patient location during evaluation: PACU Anesthesia Type: General Level of consciousness: awake and alert Pain management: pain level controlled Vital Signs Assessment: post-procedure vital signs reviewed and stable Respiratory status: spontaneous breathing, nonlabored ventilation, respiratory function stable and patient connected to nasal cannula oxygen Cardiovascular status: blood pressure returned to baseline and stable Postop Assessment: no apparent nausea or vomiting Anesthetic complications: no   No notable events documented.  Last Vitals:  Vitals:   09/17/22 0049 09/17/22 0121  BP: (!) 101/59 114/70  Pulse: 64 66  Resp: 17 18  Temp:    SpO2: 97% 97%    Last Pain:  Vitals:   09/17/22 0000  TempSrc: Axillary  PainSc: 0-No pain                 Collene Schlichter

## 2022-09-17 NOTE — Procedures (Signed)
I was present at this dialysis session. I have reviewed the session itself and made appropriate changes.   K of 4.2 on 2K bath. Using [facility].  Hb of 10.6 stable.  UF goal of 2.5L.  No c/o, awake and alert.     Filed Weights   09/15/22 0812 09/16/22 0128 09/17/22 0826  Weight: 72.6 kg 69.6 kg 73.7 kg    Recent Labs  Lab 09/17/22 0340  NA 133*  K 4.2  CL 94*  CO2 22  GLUCOSE 131*  BUN 29*  CREATININE 6.22*  CALCIUM 9.2  PHOS 5.9*    Recent Labs  Lab 09/15/22 0846 09/15/22 0904 09/16/22 0217 09/17/22 0140  WBC 11.4*  --  13.0* 9.3  NEUTROABS 9.7*  --   --   --   HGB 11.7* 13.6 11.8* 10.6*  HCT 38.9* 40.0 38.0* 35.4*  MCV 88.8  --  86.6 89.2  PLT 190  --  160 164    Scheduled Meds:  amiodarone  200 mg Oral Daily   apixaban  5 mg Oral BID   Chlorhexidine Gluconate Cloth  6 each Topical Q0600   dextrose  12.5 g Intravenous Once   influenza vac split trivalent PF  0.5 mL Intramuscular Tomorrow-1000   midodrine  10 mg Oral TID WC   mometasone-formoterol  2 puff Inhalation BID   sucroferric oxyhydroxide  1,000 mg Oral BID PC   Continuous Infusions:  cefTRIAXone (ROCEPHIN)  IV     doxycycline (VIBRAMYCIN) IV 100 mg (09/17/22 0937)   metronidazole Stopped (09/17/22 0240)   PRN Meds:.acetaminophen, albuterol, alteplase, benzonatate, heparin, lidocaine (PF), lidocaine-prilocaine, pentafluoroprop-tetrafluoroeth   Sabra Heck  MD 09/17/2022, 9:39 AM

## 2022-09-17 NOTE — Progress Notes (Signed)
Called to get nursing report for pt to come to HD was told by Trula Ore who answered the phone nurse was in another room she will have her to call me back for report

## 2022-09-17 NOTE — Progress Notes (Signed)
Received patient in bed to unit.  Alert and oriented.  Informed consent signed and in chart.   TX duration:3  Patient tolerated well.  Transported back to the room  Alert, without acute distress.  Hand-off given to patient's nurse.   Access used: left HD catheter Access issues: none  Total UF removed: 2L Medication(s) given: albumin, doxycycline, rocephin   09/17/22 1153  Vitals  Temp 97.8 F (36.6 C)  Temp Source Oral  BP (!) 96/56  MAP (mmHg) 68  BP Location Right Arm  BP Method Automatic  Patient Position (if appropriate) Lying  Pulse Rate 76  Pulse Rate Source Monitor  ECG Heart Rate 78  Resp (!) 24  Oxygen Therapy  SpO2 98 %  O2 Device Nasal Cannula  O2 Flow Rate (L/min) 2 L/min  During Treatment Monitoring  Blood Flow Rate (mL/min) 399 mL/min  Arterial Pressure (mmHg) -267.26 mmHg  Venous Pressure (mmHg) 231.1 mmHg  TMP (mmHg) 5.86 mmHg  Ultrafiltration Rate (mL/min) 1052 mL/min  Dialysate Flow Rate (mL/min) 300 ml/min  Duration of HD Treatment -hour(s) 3 hour(s)  Cumulative Fluid Removed (mL) per Treatment  2000.2  HD Safety Checks Performed Yes  Intra-Hemodialysis Comments Tx completed;Tolerated well  Dialysis Fluid Bolus Normal Saline  Bolus Amount (mL) 300 mL      Krina Mraz S [facility]

## 2022-09-17 NOTE — Progress Notes (Signed)
Brief Cardiology Telemetry Review   Asked to review telemetry overnight for episode of arrythmia during desaturation event. Detailed telemetry review from 7pm 9/8 to 1:30am on 9/9 reveals frequent episodes of Mobitz type I block (Wenckebach) and intermittent sinus arrhythmia, but no episodes of any higher degree AV block. Periods when telemetry read afib actually represent Mobitz Type 1 block. During his desaturation episode, he was having Wenckebach before, during, and after. Suspect having this as he is sleeping during this time period with higher vagal tone leading to this benign block.  Would not recommend any changes in management for asymptomatic Wenckebach overnight.   Achille Rich, MD Overnight Cardiology Fellow

## 2022-09-17 NOTE — Progress Notes (Signed)
RN walked into room, patient had central line in hand and had dislodged by approximately 8 CM, IVF stopped, CXR order, IV team paged.  FMTS paged.  Site covered with gauze waiting for further orders/information.

## 2022-09-17 NOTE — Assessment & Plan Note (Deleted)
Rates controlled. - apixaban 5 mg BID

## 2022-09-17 NOTE — Progress Notes (Signed)
1 Day Post-Op Subjective: Patient somnolent this morning and difficult to arouse. He reports no pain. Dressings were removed and tissue appears healthy with bleeding edges and no purulence. Redressed with WTD gauze and kerlix fluffs.  Overnight he did have an episode of arrhythmia with desaturation and troponins to 108. Primary team managing.    Objective: Vital signs in last 24 hours: Temp:  [97 F (36.1 C)-98.2 F (36.8 C)] 97.9 F (36.6 C) (09/09 0400) Pulse Rate:  [61-75] 61 (09/09 0400) Resp:  [13-22] 15 (09/09 0400) BP: (76-114)/(41-70) 101/54 (09/09 0400) SpO2:  [92 %-100 %] 98 % (09/09 0400)  Assessment/Plan:  This is a 62 y.o. male with a history of asthma, ESRD on dialysis (history of prior failed transplant), COPD, diabetes, esophageal varices, stroke with residual hemiparesis, HTN, HFrEF, pAfib on eliquis who presented with weakness x2 weeks. He had significant penile necrosis and purulence on arrival. Now s/p OR debridement on 09/16/22.   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 Agree with consult to palliative care.  - Dressing changed at bedside. No further debridement indicated at this time.  - Urology will change dressing on 09/18/22, then transition to nurse changes - Continue vanc/cefepime/flagyl/doxycyline  - Wound cultures pending  - No foley indicated   Intake/Output from previous day: 09/08 0701 - 09/09 0700 In: 1686.2 [P.O.:240; I.V.:227.2; IV Piggyback:1219] Out: 25 [Blood:25]  Intake/Output this shift: Total I/O In: 590 [P.O.:240; IV Piggyback:350] Out: -   Physical Exam:  General: somnolent CV: No cyanosis Lungs: equal chest rise Abdomen: Soft, NTND, no rebound or guarding Skin: poorly vascularized lower extremities with left knee wound Gu: minimal remaining glans tissue. Surgical site edges appear healthy with bleeding edges, no evidence of purulence.   Lab  Results: Recent Labs    09/15/22 0904 09/16/22 0217 09/17/22 0140  HGB 13.6 11.8* 10.6*  HCT 40.0 38.0* 35.4*   BMET Recent Labs    09/17/22 0140 09/17/22 0340  NA 134* 133*  K 4.3 4.2  CL 93* 94*  CO2 23 22  GLUCOSE 137* 131*  BUN 29* 29*  CREATININE 6.33* 6.22*  CALCIUM 9.4 9.2     Studies/Results: DG CHEST PORT 1 VIEW  Result Date: 09/17/2022 CLINICAL DATA:  [id number], encounter for central line placement. EXAM: PORTABLE CHEST 1 VIEW COMPARISON:  Portable chest yesterday at 11:38 p.m. FINDINGS: 1:17 a.m. There is new demonstration of a left IJ central line place with the tip in the upper SVC. Left IJ dialysis catheter is again noted with the tip in the upper right atrium. No pneumothorax. Interstitial and widespread patchy consolidative opacities in the lungs continue to be seen with linear scarring or atelectasis in the right lower lung field. Small right pleural effusion. Stable cardiomegaly, with aortic calcification and tortuosity. Central vascular prominence is similar. No new osseous findings.  Vascular calcifications both axilla. Compare: Overall aeration seems unchanged. IMPRESSION: 1. New demonstration of a left IJ central line with the tip in the upper SVC. No pneumothorax. 2. Interstitial and patchy consolidative opacities in the lungs continue to be seen in keeping with edema or pneumonia. Electronically Signed   By: Almira Bar M.D.   On: 09/17/2022 04:25   DG Chest Port 1 View  Result Date: 09/16/2022 CLINICAL DATA:  Post central line placement EXAM: PORTABLE CHEST 1 VIEW COMPARISON:  Chest x-ray 09/15/2022.  Chest CT 09/04/2021. FINDINGS: Left-sided central venous catheter tip projects over the cavoatrial junction. The heart  is enlarged. There central pulmonary vascular congestion. There are patchy opacities in both lower lungs. There is a small right pleural effusion. There is no pneumothorax or acute fracture. Calcified left renal lesions are partially seen.  IMPRESSION: 1. Left-sided central venous catheter tip projects over the cavoatrial junction. 2. Cardiomegaly with central pulmonary vascular congestion and small right pleural effusion. 3. Patchy opacities in both lower lungs may reflect edema or infection. Electronically Signed   By: Darliss Cheney M.D.   On: 09/16/2022 23:53   MR KNEE LEFT WO CONTRAST  Result Date: 09/15/2022 CLINICAL DATA:  Soft tissue infection suspected, knee, xray done Concern for knee infection, ESRD patient. Pain EXAM: MRI OF THE LEFT KNEE WITHOUT CONTRAST TECHNIQUE: Multiplanar, multisequence MR imaging of the knee was attempted. No intravenous contrast was administered. COMPARISON:  X-ray 09/15/2022 FINDINGS: Limited, incomplete, essentially nondiagnostic MRI of the left knee. Only 3 sequences were able to be obtained, each of which is severely degraded by motion artifact. The joint was imaged in 90 degrees of flexion resulting in nonstandard planes. No evidence to suggest a fracture. There is a small knee joint effusion. Assessment of the meniscal and ligamentous structures is unable to be performed on this exam. No soft tissue swelling or fluid collections. There is muscle atrophy. Extensor mechanism appears intact. IMPRESSION: Limited, incomplete, essentially nondiagnostic MRI of the left knee. See above. Electronically Signed   By: Duanne Guess D.O.   On: 09/15/2022 19:00   CT ABDOMEN PELVIS W CONTRAST  Result Date: 09/15/2022 CLINICAL DATA:  Acute abdominal pain. Cellulitis and clinical suspicion for Fournier's gangrene. EXAM: CT ABDOMEN AND PELVIS WITH CONTRAST TECHNIQUE: Multidetector CT imaging of the abdomen and pelvis was performed using the standard protocol following bolus administration of intravenous contrast. RADIATION DOSE REDUCTION: This exam was performed according to the departmental dose-optimization program which includes automated exposure control, adjustment of the mA and/or kV according to patient size  and/or use of iterative reconstruction technique. CONTRAST:  75mL OMNIPAQUE IOHEXOL 350 MG/ML SOLN COMPARISON:  06/21/2021 FINDINGS: Lower Chest: No acute findings. Bibasilar pleural-parenchymal scarring again noted. Hepatobiliary: No suspicious hepatic masses identified. Gallstones are seen, however there is no evidence of cholecystitis or biliary dilatation. Pancreas:  No mass or inflammatory changes. Spleen: Within normal limits in size and appearance. Adrenals/Urinary Tract: Normal adrenal glands. Diffuse bilateral renal involvement by multiple calcified lesions again seen. These show no significant change compared to previous study, consistent with benign etiology, likely representing calcified and proteinaceous renal cysts. These findings may be due to polycystic kidney disease or chronic 6 disease of dialysis. Diffusely calcified renal transplant is again seen in the right iliac fossa. No evidence of ureteral calculi or hydronephrosis. Urinary bladder is empty. Stomach/Bowel: No evidence of obstruction, inflammatory process or abnormal fluid collections. Diverticulosis is seen mainly involving the descending and sigmoid colon, however there is no evidence of diverticulitis. Normal appendix visualized. A small right lower quadrant ventral hernia is again seen containing loop of small bowel. No evidence of bowel obstruction or strangulation. Vascular/Lymphatic: No pathologically enlarged lymph nodes. No acute vascular findings. Reproductive:  No mass or other significant abnormality. Other: None. No evidence of inflammatory process or soft tissue gas in the abdominal or pelvic wall soft tissues. Musculoskeletal:  No suspicious bone lesions identified. IMPRESSION: No acute findings within the abdomen or pelvis. No radiographic evidence of body wall cellulitis or soft tissue gas. Stable calcified bilateral renal lesions, consistent with benign etiology, likely calcified and proteinaceous renal cysts. This  could  be due to acquired cystic disease of dialysis or polycystic kidney disease. Diffusely calcified renal transplant in right iliac fossa. Colonic diverticulosis, without radiographic evidence of diverticulitis. Cholelithiasis. No radiographic evidence of cholecystitis. Small right lower quadrant ventral hernia containing loop of small bowel. No evidence of bowel obstruction or strangulation. Electronically Signed   By: Danae Orleans M.D.   On: 09/15/2022 10:55   DG Knee Complete 4 Views Left  Result Date: 09/15/2022 CLINICAL DATA:  Left knee pain EXAM: LEFT KNEE - COMPLETE 4+ VIEW COMPARISON:  None Available. FINDINGS: No evidence of fracture, dislocation, or joint effusion. Vascular calcifications and a vascular stent are noted. No radiopaque foreign body. Tricompartmental mild degenerative changes. IMPRESSION: 1. No fracture or dislocation. Note that the degree of osteopenia slightly limits assessment. 2.  Mild tricompartmental degenerative changes Electronically Signed   By: Lorenza Cambridge M.D.   On: 09/15/2022 09:40   DG Chest Port 1 View  Result Date: 09/15/2022 CLINICAL DATA:  weakness EXAM: PORTABLE CHEST 1 VIEW COMPARISON:  CXR 07/14/22 FINDINGS: Left-sided central venous catheter with the tip in the right atrium, unchanged. Mild cardiomegaly. Small right pleural effusion, unchanged. No pneumothorax. Compared to prior exam there is increased hazy opacity at the right lung base, which could represent atelectasis or infection. No radiographically apparent displaced rib fractures. Visualized upper abdomen is unremarkable. IMPRESSION: 1. Increased hazy opacity at the right lung base, which could represent atelectasis or infection. 2. Small right pleural effusion, unchanged. Electronically Signed   By: Lorenza Cambridge M.D.   On: 09/15/2022 09:37      LOS: 2 days   Cathren Harsh, MD [facility] Resident  [facility]   Dr. Liliane Shi is the attending of record for this consult.    09/17/2022, 6:48 AM

## 2022-09-17 NOTE — Progress Notes (Signed)
Went to bedside after being notified by RN that patient was found pulling on central line.  As noted in Dr. Ophelia Charter note, CXR has been reviewed, line can still pull blood and be flushed and is currently still usable.   Patient admitted to pulling on the line stating that he did not realize what he was pulling on and was trying to get comfortable as he is hooked up to several things including the central line, cardiac monitors, oxygen tubing.  He states he was not intentionally trying to pull the central line out. He is alert and oriented and understands the importance of the central line which we discussed.  He understands that if the line is pulled out anymore it will have to be completely removed and replaced which he does not want.  He is agreeable to wearing mitts and the line was taped down securely by IV team.    As patient is alert and oriented, very calm, and able to express understanding of the importance of the CVC and desire to keep it in place, I do not feel restraints are needed at this time. RN advised to keep mitts on and notify FM team if pt becomes agitated/pulling at anything and we will then order restraints.

## 2022-09-17 NOTE — Assessment & Plan Note (Addendum)
Abrasion over the left knee with mild swelling and mild warmth. Clinically improving. No gout history, maintaining broad differential with possibility of infectious/ inflammatory /traumatic causes. Contracture on left side secondary to prior CVA but worsened with knee injury. MR w/o contrast L knee nondiagnostic but no soft tissue swelling or fluid collections that were seen. Small knee joint effusion. - Continue abx as above  - Consider orthopedics consultation if worsening

## 2022-09-17 NOTE — Progress Notes (Signed)
Pt receives out-pt HD at [facility] [place] on TTS 6:35 am chair time. Will assist as needed.   Olivia Canter Renal Navigator [phone]

## 2022-09-17 NOTE — Assessment & Plan Note (Addendum)
Pt requires a renal-diet, but has signs of muscle wasting and inadequate food intake. - Evaluate for food insecurity - Nutrition consult placed

## 2022-09-17 NOTE — Progress Notes (Addendum)
Daily Progress Note Intern Pager: [phone]  Patient name: Nathaniel White Medical record number: [id number] Date of birth: [birth year] Age: 62 y.o. Gender: male  Primary Care Provider: Claiborne Rigg, NP Consultants: Urology, nephrology, palliative  Code Status: Full  Pt Overview and Major Events to Date:  9/7: Admitted 9/8: Surgical Debridement / partial penectomy 9/9: Central line placement  Assessment and Plan: Nathaniel White is a 62 y.o male who presented with weakness and purulent penile necrosis/ calciphylaxis. He underwent surgical debridement on 9/8. Differntials for weakness include possible pneumonia (Chest x-ray showed atelectasis vs consolidation) or penile infection leading to sepsis. Central line was placed on 9/9 due to loss of IV access. Past medical history consists of asthma, ESRD on dialysis w/ hx of failed transplant, COPD, T2DM, esophageal varices, stroke w/ residual hemiparesis, HTN, HFrEF, paroxysmal A-fib on elliquis, VT, anemia of chronic disease. Considering palliative consult due to high mortality associated with calciphylaxis of penile region and ESRD. Patient is currently relatively stable. Assessment & Plan Sepsis secondary to penile infection Briefly met sepsis criteria in ED with hypotension, but has not had chronic hypotension requiring midodrine. Remains afebrile. HIV testing non-reactive. Chest XR with revealed some right sided atelectasis versus consolidation, patient denies any coughing and states that he is chronically on 2 L of oxygen at home. Considering differentials of penile infection/ calciphylaxis vs pulmonary infection as cause of systemic infection. - Urology and nephrology following, appreciate recommendations - IV cefepime transitioned to IV CTX (total started 9/7), IV doxycycline 100mg  Q12H (9/7-), IV metronidazole 500 mg Q12H (9/7) - Discontinued IV vancomycin per pharmacy (9/7)  - Continuing to monitor CBC, RFP daily - GC/chlamydia  in progress, RPR POSITIVE. Ordered T. Pallidum to evaluate further - F/u blood culture (does not produce urine for Ucx). Penile wound cultures thus far revealed few gram negative rods - Vitals per floor protocol  - Added incentive spirometry  Puncture wound of left knee with complication Abrasion over the left knee with mild swelling and mild warmth. Clinically improving. No gout history, maintaining broad differential with possibility of infectious/ inflammatory /traumatic causes. Contracture on left side secondary to prior CVA but worsened with knee injury. MR w/o contrast L knee nondiagnostic but no soft tissue swelling or fluid collections that were seen. Small knee joint effusion. - Continue abx as above  - Consider orthopedics consultation if worsening  Weakness generalized 1-2 weeks of subjective reduced movement and inability to walk. Reports this had been the case since he fell and injured his L knee.  Left-sided weakness noted throughout his chart, unsure how acute this reduced movement is. Infectious etiology vs. Deconditioning.  -PT/OT following, appreciate recommendations  -Considering head imaging if neuro exam changes -Considering palliative consultation ESRD (end stage renal disease) (HCC) Most recent creatinine 6.22. TTS schedule. Received hemodialysis 9/9 without complications. Hemodialysis initiated in 2013. - Continue midodrine 10 mg 3 times daily - Renal function panel ordered, awaiting results - Consulted nephrology, appreciate recommendations - Continuing to avoid nephrotoxic drugs - Sucroferric oxyhydroxide 1000mg  BID w meals Moderate protein-calorie malnutrition (HCC) Pt requires a renal-diet, but has signs of muscle wasting and inadequate food intake. - Evaluate for food insecurity - Nutrition consult placed First degree heart block Patient desaturated last night briefly with EKG showing multiple episodes of arrythmia during desaturation event. Frequent episodes of  Mobitz type I block (Wenckebach) and intermittent sinus arrhythmia observed. Cardiology does not recommend formal follow-up at this time. -Cardiology consulted, appreciate recommendations -Continue  home 2L O2 via [place] -Troponin levels flattened ~100, no longer trending   Chronic, stable conditions:  HFrEF: Chronically on 2L O2 via [place]. Avoid fluids unless necessary Paroxysmal A-fib: Apixaban 5 mg BID COPD: Albuterol 2.5 mg Q6h PRN. Mometasone-formoterol 200-5, inhalation BID. T2DM: Diet controlled. Hypoglycemic episodes on admission resolved, avoiding insulin.  FEN/GI: Renal diet w/ fluid restriction  PPx: Eliquis (home dose) Dispo: Pending clinical improvement. Considering palliative consult  Subjective:  Spoke with patient while he was eating breakfast. Patient reported that he was feeling "about he same." He denied any increased SOB or chest pain. He reported his weakness had improved since admission. Patient reported pain in the penile region, but pain not reported elsewhere.   Objective: Temp:  [97 F (36.1 C)-98.2 F (36.8 C)] 97.9 F (36.6 C) (09/09 0400) Pulse Rate:  [61-75] 61 (09/09 0400) Resp:  [13-22] 15 (09/09 0400) BP: (76-114)/(41-70) 101/54 (09/09 0400) SpO2:  [92 %-100 %] 98 % (09/09 0400)  Physical Exam: General: Awake and alert, responsive to questions Cardiovascular: Regular rate, intermittently irregular rhythm. No murmurs detected Respiratory: Lungs clear to auscultation bilaterally. No increased work of breathing on baseline 2L O2 via [place] Abdomen: Soft, non-tender to palpation. Lower abdomen slightly tender to palpation radiating from penile region Extremities: No lower extremity edema. Flexed left leg secondary to past CVA  Laboratory: Most recent CBC Lab Results  Component Value Date   WBC 9.3 09/17/2022   HGB 10.6 (L) 09/17/2022   HCT 35.4 (L) 09/17/2022   MCV 89.2 09/17/2022   PLT 164 09/17/2022   Most recent BMP    Latest Ref Rng & Units 09/17/2022     3:40 AM  BMP  Glucose 70 - 99 mg/dL 409   BUN 6 - 20 mg/dL 29   Creatinine 8.11 - 1.24 mg/dL 9.14   Sodium 782 - 956 mmol/L 133   Potassium 3.5 - 5.1 mmol/L 4.2   Chloride 98 - 111 mmol/L 94   CO2 22 - 32 mmol/L 22   Calcium 8.9 - 10.3 mg/dL 9.2   Other pertinent labs: T. Pallidum  Imaging/Diagnostic Tests: None  Dayle Points, Medical Student 09/17/2022, 7:08 AM  Medical Student, [facility] FPTS Intern pager: [phone], text pages welcome Secure chat group [facility] Teaching Service    Resident attestation: I agree with the documentation of Student Dr. Roselyn Bering above. I have made adjustments to her note as appropriate. I have seen the patient and performed physical exam on the patient consistent with her documented physical exam above.  Alfredo Martinez, MD

## 2022-09-17 NOTE — Progress Notes (Signed)
CXR obtaiend after he tried to self-discontinue his CVC-- asked to review CXR.  CXR reviewed> LIJ CVC terminating at junction of LIJ and innominate vein, still ok to use if able to flush & pull back blood> confirmed this is the case.   Needs restraints.  If the CVC is pulled out any more, it needs to be removed due to risk of entraining air.  Steffanie Dunn, DO 09/17/22 10:26 PM [facility]  For contact information, see Amion. If no response to pager, please call PCCM consult pager. After hours, 7PM- 7AM, please call Elink.

## 2022-09-17 NOTE — Progress Notes (Signed)
Left internal jugular CL in place. Cath has migrated out to 15.5 mark. Advised Unit RN that a chest X-ray is recommended to confirm placement and get verification from MD to continue use.

## 2022-09-17 NOTE — Assessment & Plan Note (Addendum)
Briefly met sepsis criteria in ED with hypotension, but has not had chronic hypotension requiring midodrine. Remains afebrile. HIV testing non-reactive. Chest XR with revealed some right sided atelectasis versus consolidation, patient denies any coughing and states that he is chronically on 2 L of oxygen at home. Considering differentials of penile infection/ calciphylaxis vs pulmonary infection as cause of systemic infection. - Urology and nephrology following, appreciate recommendations - IV cefepime transitioned to IV CTX (total started 9/7), IV doxycycline 100mg  Q12H (9/7-), IV metronidazole 500 mg Q12H (9/7) - Discontinued IV vancomycin per pharmacy (9/7)  - Continuing to monitor CBC, RFP daily - GC/chlamydia in progress, RPR POSITIVE. Ordered T. Pallidum to evaluate further - F/u blood culture (does not produce urine for Ucx). Penile wound cultures thus far revealed few gram negative rods - Vitals per floor protocol  - Added incentive spirometry

## 2022-09-17 NOTE — Assessment & Plan Note (Addendum)
Patient desaturated last night briefly with EKG showing multiple episodes of arrythmia during desaturation event. Frequent episodes of Mobitz type I block (Wenckebach) and intermittent sinus arrhythmia observed. Cardiology does not recommend formal follow-up at this time. -Cardiology consulted, appreciate recommendations -Continue home 2L O2 via [place] -Troponin levels flattened ~100, no longer trending

## 2022-09-17 NOTE — Progress Notes (Signed)
Called again to get nursing report spoke with Kenney Houseman RN said she will call back to give report

## 2022-09-17 NOTE — Progress Notes (Addendum)
FMTS Interim Progress Note  S: Called to bedside by RN due to brief episode where patient became hard to arouse and desatted into the 50s.  Episode was very brief, only a couple minutes at most per RN.  At bedside patient was alert , denied being in any discomfort at that moment, no chest pain, no shortness of breath.  States he is fine.  O: BP (!) 101/59   Pulse 64   Temp 97.9 F (36.6 C) (Axillary)   Resp 17   Ht 5\' 9"  (1.753 m)   Wt 69.6 kg   SpO2 97%   BMI 22.66 kg/m   General: 63 year old male, NAD Cardio: irregular rhythm, bradycardic  Lungs: Diminished sounds at right lower lobe, otherwise clear to auscultation anteriorly and breathing comfortably on home 2 L O2 [place] Abdomen: Bowel sounds present, soft, nontender to patient, non distended Neuro: Alert and oriented, cranial nerves II through XII intact, sensation intact, finger-nose-finger normal with right arm, unable to do with left arm due to chronic weakness s/p stroke  A/P:  Hypoxia Very brief, patient quickly returned to SpO2 100% on home O2. CXR consistent with prior.  EKG showed first-degree heart block.  Upon review of telemetry, there is evidence of Wenckebach throughout the evening.  Will obtain labs to look for etiology (thyroid dysfunction, anemia, electrolyte imbalances) of new heart block and consult cardiology.  -CMP -Mag -CBC -TSH -Troponin  Erick Alley, DO 09/17/2022, 1:22 AM PGY-3, [facility] Service pager [phone]

## 2022-09-17 NOTE — Assessment & Plan Note (Addendum)
Most recent creatinine 6.22. TTS schedule. Received hemodialysis 9/9 without complications. Hemodialysis initiated in 2013. - Continue midodrine 10 mg 3 times daily - Renal function panel ordered, awaiting results - Consulted nephrology, appreciate recommendations - Continuing to avoid nephrotoxic drugs - Sucroferric oxyhydroxide 1000mg  BID w meals

## 2022-09-17 NOTE — Progress Notes (Signed)
   09/17/22 1000  During Treatment Monitoring  Intra-Hemodialysis Comments Progressing as prescribed;Tolerated well (uf off due to low BP goal decreased to 2 per order albumin order placed, pt denies chest pain dizziness or SOB)

## 2022-09-17 NOTE — Assessment & Plan Note (Deleted)
Diet controlled. Hypoglycemia yesterday to 50s.  Improved this a.m. to the 100s. -CBGs q4h -avoid insulin

## 2022-09-17 NOTE — Progress Notes (Signed)
Critical troponin 108.  Dr Erick Alley notified.

## 2022-09-18 ENCOUNTER — Inpatient Hospital Stay (HOSPITAL_COMMUNITY): Payer: Medicare Other

## 2022-09-18 DIAGNOSIS — S3120XA Unspecified open wound of penis, initial encounter: Secondary | ICD-10-CM | POA: Diagnosis not present

## 2022-09-18 LAB — RENAL FUNCTION PANEL
Albumin: 2.2 g/dL — ABNORMAL LOW (ref 3.5–5.0)
Anion gap: 12 (ref 5–15)
BUN: 32 mg/dL — ABNORMAL HIGH (ref 6–20)
CO2: 23 mmol/L (ref 22–32)
Calcium: 9.3 mg/dL (ref 8.9–10.3)
Chloride: 96 mmol/L — ABNORMAL LOW (ref 98–111)
Creatinine, Ser: 5.04 mg/dL — ABNORMAL HIGH (ref 0.61–1.24)
GFR, Estimated: 12 mL/min — ABNORMAL LOW (ref >60)
Glucose, Bld: 111 mg/dL — ABNORMAL HIGH (ref 70–99)
Phosphorus: 4.2 mg/dL (ref 2.5–4.6)
Potassium: 3.7 mmol/L (ref 3.5–5.1)
Sodium: 131 mmol/L — ABNORMAL LOW (ref 135–145)

## 2022-09-18 LAB — GLUCOSE, CAPILLARY
Glucose-Capillary: 117 mg/dL — ABNORMAL HIGH (ref 70–99)
Glucose-Capillary: 92 mg/dL (ref 70–99)
Glucose-Capillary: 110 mg/dL — ABNORMAL HIGH (ref 70–99)
Glucose-Capillary: 83 mg/dL (ref 70–99)

## 2022-09-18 LAB — CBC WITH DIFFERENTIAL/PLATELET
Abs Immature Granulocytes: 0.11 10*3/uL — ABNORMAL HIGH (ref 0.00–0.07)
Basophils Absolute: 0 10*3/uL (ref 0.0–0.1)
Basophils Relative: 0 %
Eosinophils Absolute: 0 10*3/uL (ref 0.0–0.5)
Eosinophils Relative: 0 %
HCT: 33.3 % — ABNORMAL LOW (ref 39.0–52.0)
Hemoglobin: 10 g/dL — ABNORMAL LOW (ref 13.0–17.0)
Immature Granulocytes: 1 %
Lymphocytes Relative: 6 %
Lymphs Abs: 0.7 10*3/uL (ref 0.7–4.0)
MCH: 27 pg (ref 26.0–34.0)
MCHC: 30 g/dL (ref 30.0–36.0)
MCV: 89.8 fL (ref 80.0–100.0)
Monocytes Absolute: 0.9 10*3/uL (ref 0.1–1.0)
Monocytes Relative: 8 %
Neutro Abs: 10.1 10*3/uL — ABNORMAL HIGH (ref 1.7–7.7)
Neutrophils Relative %: 85 %
Platelets: 165 10*3/uL (ref 150–400)
RBC: 3.71 MIL/uL — ABNORMAL LOW (ref 4.22–5.81)
RDW: 19.1 % — ABNORMAL HIGH (ref 11.5–15.5)
WBC: 11.8 10*3/uL — ABNORMAL HIGH (ref 4.0–10.5)
nRBC: 0 % (ref 0.0–0.2)

## 2022-09-18 LAB — BASIC METABOLIC PANEL
Anion gap: 11 (ref 5–15)
BUN: 31 mg/dL — ABNORMAL HIGH (ref 6–20)
CO2: 24 mmol/L (ref 22–32)
Calcium: 9.4 mg/dL (ref 8.9–10.3)
Chloride: 98 mmol/L (ref 98–111)
Creatinine, Ser: 4.99 mg/dL — ABNORMAL HIGH (ref 0.61–1.24)
GFR, Estimated: 13 mL/min — ABNORMAL LOW (ref >60)
Glucose, Bld: 112 mg/dL — ABNORMAL HIGH (ref 70–99)
Potassium: 3.7 mmol/L (ref 3.5–5.1)
Sodium: 133 mmol/L — ABNORMAL LOW (ref 135–145)

## 2022-09-18 LAB — HEPATITIS B SURFACE ANTIBODY, QUANTITATIVE: Hep B S AB Quant (Post): 490 m[IU]/mL

## 2022-09-18 MED ORDER — SODIUM THIOSULFATE 250 MG/ML IV SOLN
25.0000 g | INTRAVENOUS | Status: DC
  Administered 2022-09-18: 25 g via INTRAVENOUS
  Filled 2022-09-18 (×2): qty 100

## 2022-09-18 MED ORDER — PENICILLIN G BENZATHINE 1200000 UNIT/2ML IM SUSY
2.4000 10*6.[IU] | PREFILLED_SYRINGE | INTRAMUSCULAR | Status: DC
  Administered 2022-09-18: 2.4 10*6.[IU] via INTRAMUSCULAR
  Filled 2022-09-18: qty 4

## 2022-09-18 MED ORDER — HYDROXYZINE HCL 10 MG PO TABS
10.0000 mg | ORAL_TABLET | Freq: Three times a day (TID) | ORAL | Status: DC | PRN
  Administered 2022-09-18: 10 mg via ORAL
  Filled 2022-09-18: qty 1

## 2022-09-18 MED ORDER — HEPARIN SODIUM (PORCINE) 1000 UNIT/ML IJ SOLN
INTRAMUSCULAR | Status: AC
  Administered 2022-09-18: 1000 [IU]
  Filled 2022-09-18: qty 5

## 2022-09-18 MED ORDER — AMOXICILLIN-POT CLAVULANATE 875-125 MG PO TABS: 1.0000 | ORAL_TABLET | Freq: Two times a day (BID) | ORAL | Status: DC

## 2022-09-18 MED ORDER — AMOXICILLIN-POT CLAVULANATE 500-125 MG PO TABS
1.0000 | ORAL_TABLET | ORAL | Status: DC
  Administered 2022-09-18: 1 via ORAL
  Filled 2022-09-18 (×2): qty 1

## 2022-09-18 NOTE — Progress Notes (Signed)
Back on unit, set of VS

## 2022-09-18 NOTE — Social Work (Signed)
TOC is following and acknowledges consult for SNF recommendation. Pt has been off the unit x2 days for HD. Pt still not medically stable and TOC will follow to assist with the appropriate disposition.

## 2022-09-18 NOTE — Assessment & Plan Note (Addendum)
Patient desaturated last night briefly with EKG showing multiple episodes of arrythmia during desaturation event. Frequent episodes of Mobitz type I block (Wenckebach) and intermittent sinus arrhythmia observed. Cardiology does not recommend formal follow-up at this time. -Cardiology consulted, appreciate recommendations -Continue home 2L O2 via [place]

## 2022-09-18 NOTE — Assessment & Plan Note (Addendum)
Remains afebrile. HIV testing non-reactive. Syphilis testing positive. G/C negative. Considering differentials of penile infection/ calciphylaxis vs pulmonary infection as cause of systemic infection. - Urology and nephrology following, appreciate recommendations - Discontinued doxycycline and metronidazole - transition from IV ceftriaxone to oral Augmentin for 2 weeks post debridement per ID pharmacy - Penicillin G started 9/10 for 3 doses to treat + Tpall - Continuing to monitor CBC, RFP daily - Bcx NG 3 days. Penile wound cultures thus far revealed few gram negative rods - Vitals per floor protocol  - Added incentive spirometry

## 2022-09-18 NOTE — Progress Notes (Signed)
   09/18/22 1357  Vitals  Temp 97.8 F (36.6 C)  Temp Source Oral  BP (!) 97/58  BP Location Right Arm  BP Method Automatic  Patient Position (if appropriate) Lying  Pulse Rate 68  Pulse Rate Source Monitor  Resp 17  Oxygen Therapy  SpO2 97 %  O2 Device Nasal Cannula  O2 Flow Rate (L/min) 2 L/min  During Treatment Monitoring  HD Safety Checks Performed Yes  Intra-Hemodialysis Comments Tx completed  Post Treatment  Dialyzer Clearance Lightly streaked  Hemodialysis Intake (mL) 0 mL  Liters Processed 60  Fluid Removed (mL) 500 mL  Tolerated HD Treatment Yes  Post-Hemodialysis Comments Pt goal is not met d/t hypotension.  Hemodialysis Catheter Left Subclavian Double lumen Permanent (Tunneled)  Placement Date/Time: 06/20/21 0851   Placed prior to admission: No  Time Out: Correct patient;Correct site;Correct procedure  Maximum sterile barrier precautions: Hand hygiene;Cap;Mask;Sterile gown;Sterile gloves;Large sterile sheet  Site Prep: (c) Ot...  Site Condition No complications  Blue Lumen Status Heparin locked  Red Lumen Status Heparin locked  Catheter fill solution Heparin 1000 units/ml  Catheter fill volume (Arterial) 2.1 cc  Catheter fill volume (Venous) 2.1  Dressing Type Transparent;Gauze/Drain sponge  Dressing Status Antimicrobial disc in place;Clean, Dry, Intact  Interventions New dressing  Drainage Description None  Post treatment catheter status Capped and Clamped

## 2022-09-18 NOTE — Assessment & Plan Note (Addendum)
Diet controlled. CBG this morning 110 -CBGs q4h -avoid insulin

## 2022-09-18 NOTE — Progress Notes (Signed)
[facility] Progress Note   Subjective:     No c/o this AM 2L HD yesterday Confused and agitated overnight  Objective Vitals:   09/17/22 2328 09/18/22 0443 09/18/22 0700 09/18/22 0801  BP: 107/61 113/63 109/69   Pulse: 68 70 62 60  Resp: 18 (!) 22 14 18   Temp: 98.5 F (36.9 C) 98 F (36.7 C) 97.6 F (36.4 C)   TempSrc: Oral Oral Oral   SpO2: 100% 100% 98%   Weight:      Height:       Physical Exam General: Awake, alert, NAD, on 2L [place] Heart: S1 and S2; No murmurs, gallops, or rubs Lungs: Clear anteriorly, diminished at BL bases Abdomen: Soft and non-tender Extremities: Discolored, trace BL LE edema [facility]ialysis [facility]   Filed Weights   09/16/22 0128 09/17/22 0826 09/17/22 1206  Weight: 69.6 kg 73.7 kg 71.6 kg    Intake/Output Summary (Last 24 hours) at 09/18/2022 0818 Last data filed at 09/17/2022 1513 Gross per 24 hour  Intake 450.49 ml  Output 2 ml  Net 448.49 ml    Additional Objective Labs: Basic Metabolic Panel: Recent Labs  Lab 09/16/22 0217 09/17/22 0140 09/17/22 0340  NA 132* 134* 133*  K 3.6 4.3 4.2  CL 93* 93* 94*  CO2 22 23 22   GLUCOSE 77 137* 131*  BUN 21* 29* 29*  CREATININE 4.95* 6.33* 6.22*  CALCIUM 8.9 9.4 9.2  PHOS 3.4  --  5.9*   Liver Function Tests: Recent Labs  Lab 09/15/22 0846 09/16/22 0217 09/17/22 0140 09/17/22 0340  AST 40  --  32  --   ALT 21  --  18  --   ALKPHOS 142*  --  110  --   BILITOT 1.8*  --  1.7*  --   PROT 7.4  --  7.0  --   ALBUMIN 1.8* 2.3* 2.1* 2.2*   No results for input(s): "LIPASE", "AMYLASE" in the last 168 hours. CBC: Recent Labs  Lab 09/15/22 0846 09/15/22 0904 09/16/22 0217 09/17/22 0140  WBC 11.4*  --  13.0* 9.3  NEUTROABS 9.7*  --   --   --   HGB 11.7* 13.6 11.8* 10.6*  HCT 38.9* 40.0 38.0* 35.4*  MCV 88.8  --  86.6 89.2  PLT 190  --  160 164   Blood Culture    Component Value Date/Time   SDES FLUID 09/16/2022 1617   SPECREQUEST NONE 09/16/2022 1617   CULT   09/16/2022 1617    FEW GRAM NEGATIVE RODS CULTURE REINCUBATED FOR BETTER GROWTH SUSCEPTIBILITIES TO FOLLOW Performed at [facility], [address] [place], [place] [address]    REPTSTATUS PENDING 09/16/2022 1617    Cardiac Enzymes: No results for input(s): "CKTOTAL", "CKMB", "CKMBINDEX", "TROPONINI" in the last 168 hours. CBG: Recent Labs  Lab 09/17/22 0740 09/17/22 1301 09/17/22 1557 09/17/22 1925 09/18/22 0450  GLUCAP 131* 115* 115* 117* 92   Iron Studies: No results for input(s): "IRON", "TIBC", "TRANSFERRIN", "FERRITIN" in the last 72 hours. Lab Results  Component Value Date   INR 1.2 06/20/2021   INR 1.4 (H) 06/20/2021   INR 1.0 10/08/2013   Studies/Results: DG CHEST PORT 1 VIEW  Result Date: 09/18/2022 CLINICAL DATA:  [id number] Central line complication [id number] EXAM: PORTABLE CHEST 1 VIEW COMPARISON:  CXR 09/17/22 FINDINGS: Left-sided dialysis catheter with the tip right atrium. There is a additional left IJ central venous catheter which projects over the expected location of the left brachiocephalic  vein. If there is clinical concern for malpositioning, further evaluation with a two-view chest radiograph is recommended. Cardiomegaly. Small right pleural effusion. Hazy bibasilar airspace opacity could represent atelectasis or infection. No radiographically apparent new displaced rib fracture. This is a nonspecific cortical irregularity along the lateral aspect of the right sixth rib, may be due to prior. Mild gastric gaseous distention. IMPRESSION: 1. Left-sided dialysis catheter with the tip in the right atrium. There is a additional left IJ central venous catheter which projects over the expected location of the left brachiocephalic vein. If there is clinical concern for malpositioning, further evaluation with a two-view chest radiograph is recommended. 2. Small right pleural effusion. Hazy bibasilar airspace opacity could represent atelectasis or infection. Electronically  Signed   By: Lorenza Cambridge M.D.   On: 09/18/2022 08:09   DG CHEST PORT 1 VIEW  Result Date: 09/17/2022 CLINICAL DATA:  Central line complication. EXAM: PORTABLE CHEST 1 VIEW COMPARISON:  09/17/2022. FINDINGS: Heart is enlarged and the mediastinal contour stable. There is atherosclerotic calcification of the aorta. Airspace opacities are noted bilaterally. No effusion or pneumothorax is seen. A proximal left internal jugular catheter loops over the upper left lung field with the tip in the upper SVC. A left dialysis catheter terminates over the right atrium. No acute osseous abnormality. Vascular calcifications are noted in the axillary regions bilaterally. IMPRESSION: 1. Stable patchy airspace disease in the lungs bilaterally. 2. Looping of the proximal left internal jugular central venous catheter, loop may be external to the insertion site. Clinical correlation is recommended. 3. Left internal jugular dialysis catheter is stable in position. Electronically Signed   By: Thornell Sartorius M.D.   On: 09/17/2022 23:17   DG CHEST PORT 1 VIEW  Result Date: 09/17/2022 CLINICAL DATA:  [id number], encounter for central line placement. EXAM: PORTABLE CHEST 1 VIEW COMPARISON:  Portable chest yesterday at 11:38 p.m. FINDINGS: 1:17 a.m. There is new demonstration of a left IJ central line place with the tip in the upper SVC. Left IJ dialysis catheter is again noted with the tip in the upper right atrium. No pneumothorax. Interstitial and widespread patchy consolidative opacities in the lungs continue to be seen with linear scarring or atelectasis in the right lower lung field. Small right pleural effusion. Stable cardiomegaly, with aortic calcification and tortuosity. Central vascular prominence is similar. No new osseous findings.  Vascular calcifications both axilla. Compare: Overall aeration seems unchanged. IMPRESSION: 1. New demonstration of a left IJ central line with the tip in the upper SVC. No pneumothorax. 2.  Interstitial and patchy consolidative opacities in the lungs continue to be seen in keeping with edema or pneumonia. Electronically Signed   By: Almira Bar M.D.   On: 09/17/2022 04:25   DG Chest Port 1 View  Result Date: 09/16/2022 CLINICAL DATA:  Post central line placement EXAM: PORTABLE CHEST 1 VIEW COMPARISON:  Chest x-ray 09/15/2022.  Chest CT 09/04/2021. FINDINGS: Left-sided central venous catheter tip projects over the cavoatrial junction. The heart is enlarged. There central pulmonary vascular congestion. There are patchy opacities in both lower lungs. There is a small right pleural effusion. There is no pneumothorax or acute fracture. Calcified left renal lesions are partially seen. IMPRESSION: 1. Left-sided central venous catheter tip projects over the cavoatrial junction. 2. Cardiomegaly with central pulmonary vascular congestion and small right pleural effusion. 3. Patchy opacities in both lower lungs may reflect edema or infection. Electronically Signed   By: Darliss Cheney M.D.   On: 09/16/2022 23:53  Medications:  cefTRIAXone (ROCEPHIN)  IV Stopped (09/17/22 1136)   metronidazole 500 mg (09/17/22 2319)    amiodarone  200 mg Oral Daily   apixaban  5 mg Oral BID   Chlorhexidine Gluconate Cloth  6 each Topical Q0600   dextrose  12.5 g Intravenous Once   influenza vac split trivalent PF  0.5 mL Intramuscular Tomorrow-1000   midodrine  10 mg Oral TID WC   mometasone-formoterol  2 puff Inhalation BID   sucroferric oxyhydroxide  1,000 mg Oral BID PC    Dialysis Orders:  TTS SW  4h  450/ 500   67.5kg  2/2 bath  TDC   Heparin none - last OP HD 9/05, post wt 70.3kg - coming off 3-5kg over for last 3 wks - parsabiv 7.5mg  IV three times per week - no esa or vdra  Home meds include - albuterol, amiodarone, eliquis, advair diskus, midodrine 5mg  1- 2 on non-hd days, singulair, velphoro 1 gm ac tid, prns   Assessment/Plan:   Sepsis - abx per pmd; on ABX, resolving Penile  dermal necrosis - seen by urology. 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 Path pending.  Debrided.Start NaThiosulfate qHD today. Avoid Ca based products.  Not on VDRA.  ESRD - on HD TTS. 3H HD today to get back on schedule.  COnt THS from here HTN/ volume - cxr looks like pulm edema, pt coming off over dry wt the last 3 wks. Not edematous. BP's are not great, however. Is on midodrine non HD days at home. Midodrine recently raised to 10mg  tid for BP support. Will adjust further if needed. See above. Anemia esrd - Hb 11, no esa needs MBD ckd - CCa in range, add on phos. Hold off on giving Ca or Vitamin D supplements for now given concern for calciphylaxis.  As above.  On Velphoro H/o CVA GOC - poor prognosis, agree with Urology in proceeding with palliative care consult  Arita Miss, MD  [facility] 09/18/2022,8:18 AM  LOS: 3 days

## 2022-09-18 NOTE — Care Management Important Message (Signed)
Important Message  Patient Details  Name: Nathaniel White MRN: [id number] Date of Birth: 02-09-1960   Medicare Important Message Given:  Yes     Makayla Lanter 09/18/2022, 1:28 PM

## 2022-09-18 NOTE — Progress Notes (Signed)
Daily Progress Note Intern Pager: [phone]  Patient name: Nathaniel White Medical record number: [id number] Date of birth: January 03, 1961 Age: 62 y.o. Gender: male  Primary Care Provider: Claiborne Rigg, NP Consultants: urology, nephrology Code Status: Full  Pt Overview and Major Events to Date:  9/7: Admitted 9/8: Surgical Debridement / partial penectomy 9/9: Central line placement  Assessment and Plan:  62 year old male with past medical history asthma, ESRD on dialysis with history of failed transplant, COPD, T2DM, esophageal varices, stroke w/ residual hemiparesis, HTN, HFrEF, paroxysmal A-fib on elliquis, VT, anemia of chronic disease who presented with weakness and purulent penile necrosis.  Patient underwent surgical debridement and partial penectomy 2 days ago.  De-escalating antibiotics today, urology agrees. Discussed palliative care consult with patient due to very complex medical conditions and high mortality associated with potential calciphylaxis of penis and ESRD, patient declines at this time because he has too much going on. Consider discharge once pt fully transitioned to PO meds.  Of note, pt tugging at his central line overnight, was placed on soft restraints with mitts. D/c soft restraints and now tele sitter will monitor pt for this. States that he thinks he was tugging on it because he was itchy. Will repeat CXR since it was reported that pt tugged on it again after last XR.  Assessment & Plan Sepsis secondary to penile infection Remains afebrile. HIV testing non-reactive. Syphilis testing positive. G/C negative. Considering differentials of penile infection/ calciphylaxis vs pulmonary infection as cause of systemic infection. - Urology and nephrology following, appreciate recommendations - Discontinued doxycycline and metronidazole - transition from IV ceftriaxone to oral Augmentin for 2 weeks post debridement per ID pharmacy - Penicillin G started 9/10 for 3  doses to treat + Tpall - Continuing to monitor CBC, RFP daily - Bcx NG 3 days. Penile wound cultures thus far revealed few gram negative rods - Vitals per floor protocol  - Added incentive spirometry  Puncture wound of left knee with complication Abrasion over the left knee with mild swelling and mild warmth. Clinically improving. No gout history, maintaining broad differential with possibility of infectious/ inflammatory /traumatic causes. Contracture on left side secondary to prior CVA but worsened with knee injury. MR w/o contrast L knee nondiagnostic but no soft tissue swelling or fluid collections that were seen. Small knee joint effusion. - Continue abx as above  - stable  - Consider orthopedics consultation if worsening  Weakness generalized 1-2 weeks of subjective reduced movement and inability to walk. Reports this had been the case since he fell and injured his L knee.  Left-sided weakness noted throughout his chart, unsure how acute this reduced movement is. Infectious etiology vs. Deconditioning.  -PT/OT following, appreciate recommendations  -Considering head imaging if neuro exam changes -Pt declines palliative consult at this time ESRD (end stage renal disease) (HCC) Most recent creatinine 6.22. TTS schedule. Received hemodialysis 9/9 without complications. Will receive again today. Hemodialysis initiated in 2013. - Continue midodrine 10 mg 3 times daily - RFP - Consulted nephrology, appreciate recommendations - Continuing to avoid nephrotoxic drugs - Sucroferric oxyhydroxide 1000mg  BID w meals Moderate protein-calorie malnutrition (HCC) Pt requires a renal-diet, but has signs of muscle wasting and inadequate food intake. - Evaluate for food insecurity - Nutrition consult placed First degree heart block Patient desaturated last night briefly with EKG showing multiple episodes of arrythmia during desaturation event. Frequent episodes of Mobitz type I block (Wenckebach) and  intermittent sinus arrhythmia observed. Cardiology does not recommend  formal follow-up at this time. -Cardiology consulted, appreciate recommendations -Continue home 2L O2 via [place] HFrEF (heart failure with reduced ejection fraction) (HCC) CXR with atelectasis v. Infection.  Chronically on 2 L of oxygen at baseline. - Avoid fluid administration unless necessary Paroxysmal atrial fibrillation (HCC) Rates controlled. - apixaban 5 mg BID Diabetes mellitus without complication (HCC) Diet controlled. CBG this morning 110 -CBGs q4h -avoid insulin   Chronic and Stable Problems:  HFrEF: Chronically on 2L O2 via [place]. Avoid fluids unless necessary Paroxysmal A-fib: Apixaban 5 mg BID COPD: Albuterol 2.5 mg Q6h PRN. Mometasone-formoterol 200-5, inhalation BID. T2DM: Diet controlled. Hypoglycemic episodes on admission resolved, avoiding insulin.   FEN/GI: renal diet w/ fluid restriction PPx: eliquis Dispo: pending clinical improvement    Subjective:  Patient began tugging at a central line overnight last night.  Was placed in soft restraints to prevent him from pulling the central line out.  Patient states that he is itchy and he thinks that is why he was tugging at it in his sleep.  States his pain is okay right now.  Does not want to speak with palliative at this time.  Objective: Temp:  [97.6 F (36.4 C)-98.5 F (36.9 C)] 97.6 F (36.4 C) (09/10 1047) Pulse Rate:  [60-78] 68 (09/10 1047) Resp:  [14-24] 15 (09/10 1047) BP: (94-113)/(51-69) 106/59 (09/10 1047) SpO2:  [96 %-100 %] 98 % (09/10 1047) Weight:  [71.6 kg-72.8 kg] 72.8 kg (09/10 1048) Physical Exam: General: No acute distress, laying in bed Cardiovascular: Regular rate and rhythm, no murmurs Respiratory: No increased work of breathing, clear to auscultation bilaterally Abdomen: Nondistended Extremities: No leg swelling bilaterally  Laboratory: Most recent CBC Lab Results  Component Value Date   WBC 9.3 09/17/2022   HGB  10.6 (L) 09/17/2022   HCT 35.4 (L) 09/17/2022   MCV 89.2 09/17/2022   PLT 164 09/17/2022   Most recent BMP    Latest Ref Rng & Units 09/17/2022    3:40 AM  BMP  Glucose 70 - 99 mg/dL 086   BUN 6 - 20 mg/dL 29   Creatinine 5.78 - 1.24 mg/dL 4.69   Sodium 629 - 528 mmol/L 133   Potassium 3.5 - 5.1 mmol/L 4.2   Chloride 98 - 111 mmol/L 94   CO2 22 - 32 mmol/L 22   Calcium 8.9 - 10.3 mg/dL 9.2    Imaging/Diagnostic Tests: CXR 09/17/22 IMPRESSION: 1. Stable patchy airspace disease in the lungs bilaterally. 2. Looping of the proximal left internal jugular central venous catheter, loop may be external to the insertion site. Clinical correlation is recommended. 3. Left internal jugular dialysis catheter is stable in position.  Maki Sweetser, DO 09/18/2022, 11:00 AM  PGY-1, [facility] FPTS Intern pager: [phone], text pages welcome Secure chat group [facility] Teaching Service

## 2022-09-18 NOTE — Progress Notes (Addendum)
Went to evaluate pt's central line. Pt comfortably laying in bed, responding appropriately, and eating. On exam, the line is now pulled out more and measuring about 7cm from skin to hub of central line. The line is also coiled and taped down, with adhesive on the line. Per nursing, they are worried that line would pose an infection risk now that there is adhesive on the line. Pt still has a PIV that works, although there is concern that it may not last. At this time, we are planning to transition off IV meds and start PO antibx tomorrow.  - Order to remove central line - Continue to use PIV if able - If PIV no longer working, will attempt to replace vs consult CCM to attempt new central line.

## 2022-09-18 NOTE — Progress Notes (Signed)
IV team consulted due to pt pulling at CL again. Advised unit RN that pt would need repeat chest x-ray and verification of tip placement by MD in order to continue using CL.

## 2022-09-18 NOTE — Progress Notes (Signed)
2 Days Post-Op Subjective: Patient in mitts this morning as he has been pulling at his central line. Tired on exam, denies pain and is calm and cooperative with dressing change.  Objective: Vital signs in last 24 hours: Temp:  [97.4 F (36.3 C)-98.5 F (36.9 C)] 98 F (36.7 C) (09/10 0443) Pulse Rate:  [63-78] 70 (09/10 0443) Resp:  [16-31] 22 (09/10 0443) BP: (84-124)/(51-63) 113/63 (09/10 0443) SpO2:  [96 %-100 %] 100 % (09/10 0443) Weight:  [71.6 kg-73.7 kg] 71.6 kg (09/09 1206)  Assessment/Plan:  This is a 62 y.o. male with a history of asthma, ESRD on dialysis (history of prior failed transplant), COPD, diabetes, esophageal varices, stroke with residual hemiparesis, HTN, HFrEF, pAfib on eliquis who presented with weakness x2 weeks. He had significant penile necrosis and purulence on arrival. Now s/p OR debridement on 09/16/22.   #penile necrosis/purulence - Dressing changed at bedside. No further debridement indicated at this time.  - Nursing to change dressing daily starting on 9/11 (order placed).  - saline gauze over glans with kerlix fluffs - Continue vanc/cefepime/flagyl/doxycyline  - Wound cultures pending, blood cultures no growth - No foley indicated   Intake/Output from previous day: 09/09 0701 - 09/10 0700 In: 450.5 [IV Piggyback:450.5] Out: 2   Intake/Output this shift: No intake/output data recorded.  Physical Exam:  General: somnolent CV: No cyanosis Lungs: equal chest rise Abdomen: Soft, NTND, no rebound or guarding Skin: poorly vascularized lower extremities with left knee wound Gu: minimal remaining glans tissue. Surgical site edges appear healthy with bleeding edges, no evidence of purulence.   Lab Results: Recent Labs    09/15/22 0904 09/16/22 0217 09/17/22 0140  HGB 13.6 11.8* 10.6*  HCT 40.0 38.0* 35.4*   BMET Recent Labs    09/17/22 0140 09/17/22 0340  NA 134* 133*  K 4.3 4.2  CL 93* 94*  CO2 23 22  GLUCOSE 137* 131*  BUN 29*  29*  CREATININE 6.33* 6.22*  CALCIUM 9.4 9.2     Studies/Results: DG CHEST PORT 1 VIEW  Result Date: 09/17/2022 CLINICAL DATA:  Central line complication. EXAM: PORTABLE CHEST 1 VIEW COMPARISON:  09/17/2022. FINDINGS: Heart is enlarged and the mediastinal contour stable. There is atherosclerotic calcification of the aorta. Airspace opacities are noted bilaterally. No effusion or pneumothorax is seen. A proximal left internal jugular catheter loops over the upper left lung field with the tip in the upper SVC. A left dialysis catheter terminates over the right atrium. No acute osseous abnormality. Vascular calcifications are noted in the axillary regions bilaterally. IMPRESSION: 1. Stable patchy airspace disease in the lungs bilaterally. 2. Looping of the proximal left internal jugular central venous catheter, loop may be external to the insertion site. Clinical correlation is recommended. 3. Left internal jugular dialysis catheter is stable in position. Electronically Signed   By: Thornell Sartorius M.D.   On: 09/17/2022 23:17   DG CHEST PORT 1 VIEW  Result Date: 09/17/2022 CLINICAL DATA:  [id number], encounter for central line placement. EXAM: PORTABLE CHEST 1 VIEW COMPARISON:  Portable chest yesterday at 11:38 p.m. FINDINGS: 1:17 a.m. There is new demonstration of a left IJ central line place with the tip in the upper SVC. Left IJ dialysis catheter is again noted with the tip in the upper right atrium. No pneumothorax. Interstitial and widespread patchy consolidative opacities in the lungs continue to be seen with linear scarring or atelectasis in the right lower lung field. Small right pleural effusion. Stable cardiomegaly, with aortic calcification  and tortuosity. Central vascular prominence is similar. No new osseous findings.  Vascular calcifications both axilla. Compare: Overall aeration seems unchanged. IMPRESSION: 1. New demonstration of a left IJ central line with the tip in the upper SVC. No  pneumothorax. 2. Interstitial and patchy consolidative opacities in the lungs continue to be seen in keeping with edema or pneumonia. Electronically Signed   By: Almira Bar M.D.   On: 09/17/2022 04:25   DG Chest Port 1 View  Result Date: 09/16/2022 CLINICAL DATA:  Post central line placement EXAM: PORTABLE CHEST 1 VIEW COMPARISON:  Chest x-ray 09/15/2022.  Chest CT 09/04/2021. FINDINGS: Left-sided central venous catheter tip projects over the cavoatrial junction. The heart is enlarged. There central pulmonary vascular congestion. There are patchy opacities in both lower lungs. There is a small right pleural effusion. There is no pneumothorax or acute fracture. Calcified left renal lesions are partially seen. IMPRESSION: 1. Left-sided central venous catheter tip projects over the cavoatrial junction. 2. Cardiomegaly with central pulmonary vascular congestion and small right pleural effusion. 3. Patchy opacities in both lower lungs may reflect edema or infection. Electronically Signed   By: Darliss Cheney M.D.   On: 09/16/2022 23:53      LOS: 3 days   Cathren Harsh, MD [facility] Resident  [facility] Urology   Dr. Liliane Shi is the attending of record for this consult.    09/18/2022, 7:03 AM

## 2022-09-18 NOTE — Assessment & Plan Note (Addendum)
1-2 weeks of subjective reduced movement and inability to walk. Reports this had been the case since he fell and injured his L knee.  Left-sided weakness noted throughout his chart, unsure how acute this reduced movement is. Infectious etiology vs. Deconditioning.  -PT/OT following, appreciate recommendations  -Considering head imaging if neuro exam changes -Pt declines palliative consult at this time

## 2022-09-18 NOTE — Plan of Care (Signed)

## 2022-09-18 NOTE — Plan of Care (Signed)
  Problem: Education: Goal: Knowledge of General Education information will improve Description: Including pain rating scale, medication(s)/side effects and non-pharmacologic comfort measures Outcome: Progressing   Problem: Health Behavior/Discharge Planning: Goal: Ability to manage health-related needs will improve Outcome: Progressing   Problem: Clinical Measurements: Goal: Ability to maintain clinical measurements within normal limits will improve Outcome: Progressing Goal: Will remain free from infection Outcome: Progressing Goal: Diagnostic test results will improve Outcome: Progressing Goal: Respiratory complications will improve Outcome: Progressing Goal: Cardiovascular complication will be avoided Outcome: Progressing   Problem: Activity: Goal: Risk for activity intolerance will decrease Outcome: Progressing   Problem: Nutrition: Goal: Adequate nutrition will be maintained Outcome: Progressing   Problem: Coping: Goal: Level of anxiety will decrease Outcome: Progressing   Problem: Elimination: Goal: Will not experience complications related to bowel motility Outcome: Progressing Goal: Will not experience complications related to urinary retention Outcome: Progressing   Problem: Pain Managment: Goal: General experience of comfort will improve Outcome: Progressing   Problem: Safety: Goal: Ability to remain free from injury will improve Outcome: Progressing   Problem: Skin Integrity: Goal: Risk for impaired skin integrity will decrease Outcome: Progressing   Problem: Education: Goal: Understanding of CV disease, CV risk reduction, and recovery process will improve Outcome: Progressing Goal: Individualized Educational Video(s) Outcome: Progressing   Problem: Activity: Goal: Ability to return to baseline activity level will improve Outcome: Progressing   Problem: Cardiovascular: Goal: Ability to achieve and maintain adequate cardiovascular perfusion  will improve Outcome: Progressing Goal: Vascular access site(s) Level 0-1 will be maintained Outcome: Progressing   Problem: Health Behavior/Discharge Planning: Goal: Ability to safely manage health-related needs after discharge will improve Outcome: Progressing   Problem: Safety: Goal: Non-violent Restraint(s) Outcome: Progressing

## 2022-09-18 NOTE — Progress Notes (Signed)
RT called at bedside by RN due to coughing spell. Pt states he is fine now with occasional cough. Rhonchi breathsounds were auscultated. Flutter valve given and coached and is at bedside. Moderate tan/yellow sputum aspirated with Yankauer after flutter.Pt able to self administer. PRN albuterol MDI given. Pt currently VSS on 1.5L [place].

## 2022-09-18 NOTE — Assessment & Plan Note (Addendum)
Most recent creatinine 6.22. TTS schedule. Received hemodialysis 9/9 without complications. Will receive again today. Hemodialysis initiated in 2013. - Continue midodrine 10 mg 3 times daily - RFP - Consulted nephrology, appreciate recommendations - Continuing to avoid nephrotoxic drugs - Sucroferric oxyhydroxide 1000mg  BID w meals

## 2022-09-18 NOTE — Assessment & Plan Note (Signed)
Rates controlled. - apixaban 5 mg BID

## 2022-09-18 NOTE — Plan of Care (Signed)
  Problem: Education: Goal: Knowledge of General Education information will improve Description: Including pain rating scale, medication(s)/side effects and non-pharmacologic comfort measures Outcome: Progressing   Problem: Health Behavior/Discharge Planning: Goal: Ability to manage health-related needs will improve Outcome: Progressing   Problem: Clinical Measurements: Goal: Ability to maintain clinical measurements within normal limits will improve Outcome: Progressing Goal: Will remain free from infection Outcome: Progressing Goal: Diagnostic test results will improve Outcome: Progressing Goal: Respiratory complications will improve Outcome: Progressing Goal: Cardiovascular complication will be avoided Outcome: Progressing   Problem: Activity: Goal: Risk for activity intolerance will decrease Outcome: Progressing   Problem: Nutrition: Goal: Adequate nutrition will be maintained Outcome: Progressing   Problem: Coping: Goal: Level of anxiety will decrease Outcome: Progressing   Problem: Activity: Goal: Risk for activity intolerance will decrease Outcome: Progressing   Problem: Nutrition: Goal: Adequate nutrition will be maintained Outcome: Progressing   Problem: Coping: Goal: Level of anxiety will decrease Outcome: Progressing   Problem: Elimination: Goal: Will not experience complications related to bowel motility Outcome: Progressing Goal: Will not experience complications related to urinary retention Outcome: Progressing   Problem: Pain Managment: Goal: General experience of comfort will improve Outcome: Progressing   Problem: Safety: Goal: Ability to remain free from injury will improve Outcome: Progressing   Problem: Skin Integrity: Goal: Risk for impaired skin integrity will decrease Outcome: Progressing   Problem: Education: Goal: Understanding of CV disease, CV risk reduction, and recovery process will improve Outcome: Progressing Goal:  Individualized Educational Video(s) Outcome: Progressing   Problem: Activity: Goal: Ability to return to baseline activity level will improve Outcome: Progressing

## 2022-09-18 NOTE — Assessment & Plan Note (Signed)
Pt requires a renal-diet, but has signs of muscle wasting and inadequate food intake. - Evaluate for food insecurity - Nutrition consult placed

## 2022-09-18 NOTE — Assessment & Plan Note (Addendum)
Abrasion over the left knee with mild swelling and mild warmth. Clinically improving. No gout history, maintaining broad differential with possibility of infectious/ inflammatory /traumatic causes. Contracture on left side secondary to prior CVA but worsened with knee injury. MR w/o contrast L knee nondiagnostic but no soft tissue swelling or fluid collections that were seen. Small knee joint effusion. - Continue abx as above  - stable  - Consider orthopedics consultation if worsening

## 2022-09-18 NOTE — Assessment & Plan Note (Addendum)
CXR with atelectasis v. Infection.  Chronically on 2 L of oxygen at baseline. - Avoid fluid administration unless necessary

## 2022-09-19 DIAGNOSIS — S3120XA Unspecified open wound of penis, initial encounter: Secondary | ICD-10-CM | POA: Diagnosis not present

## 2022-09-19 LAB — CBC
HCT: 35.3 % — ABNORMAL LOW (ref 39.0–52.0)
Hemoglobin: 11.2 g/dL — ABNORMAL LOW (ref 13.0–17.0)
MCH: 27.8 pg (ref 26.0–34.0)
MCHC: 31.7 g/dL (ref 30.0–36.0)
MCV: 87.6 fL (ref 80.0–100.0)
Platelets: 162 10*3/uL (ref 150–400)
RBC: 4.03 MIL/uL — ABNORMAL LOW (ref 4.22–5.81)
RDW: 19.1 % — ABNORMAL HIGH (ref 11.5–15.5)
WBC: 9.8 10*3/uL (ref 4.0–10.5)
nRBC: 0 % (ref 0.0–0.2)

## 2022-09-19 LAB — RENAL FUNCTION PANEL
Albumin: 2.1 g/dL — ABNORMAL LOW (ref 3.5–5.0)
Anion gap: 18 — ABNORMAL HIGH (ref 5–15)
BUN: 27 mg/dL — ABNORMAL HIGH (ref 6–20)
CO2: 22 mmol/L (ref 22–32)
Calcium: 9.5 mg/dL (ref 8.9–10.3)
Chloride: 96 mmol/L — ABNORMAL LOW (ref 98–111)
Creatinine, Ser: 4.5 mg/dL — ABNORMAL HIGH (ref 0.61–1.24)
GFR, Estimated: 14 mL/min — ABNORMAL LOW (ref >60)
Glucose, Bld: 80 mg/dL (ref 70–99)
Phosphorus: 2.8 mg/dL (ref 2.5–4.6)
Potassium: 3.7 mmol/L (ref 3.5–5.1)
Sodium: 136 mmol/L (ref 135–145)

## 2022-09-19 LAB — GLUCOSE, CAPILLARY
Glucose-Capillary: 103 mg/dL — ABNORMAL HIGH (ref 70–99)
Glucose-Capillary: 114 mg/dL — ABNORMAL HIGH (ref 70–99)
Glucose-Capillary: 124 mg/dL — ABNORMAL HIGH (ref 70–99)
Glucose-Capillary: 90 mg/dL (ref 70–99)

## 2022-09-19 LAB — SURGICAL PATHOLOGY

## 2022-09-19 MED ORDER — AMOXICILLIN-POT CLAVULANATE 500-125 MG PO TABS: 1.0000 | ORAL_TABLET | ORAL | Status: DC

## 2022-09-19 NOTE — Progress Notes (Signed)
NEW ADMISSION NOTE New Admission Note: Transfer  Arrival Method: bed Mental Orientation: Oriented to person, place, situation. Cannot state year/month Telemetry: Verified x 2 Assessment: Completed Skin: Penis with dressing intact. L knee with abrasion/puncture wound open to air. IV: R AC, normal saline locked Pain: none Tubes: none Safety Measures: Safety Fall Prevention Plan has been given, discussed and signed Admission: Transfer [facility] [place] Orientation: Patient has been orientated to the room, unit and staff.  Family: none present  Orders have been reviewed and implemented. Will continue to monitor the patient. Call light has been placed within reach and bed alarm has been activated. Report given to receiving RN  Irwin Brakeman, RN

## 2022-09-19 NOTE — Assessment & Plan Note (Signed)
CXR with atelectasis v. Infection.  Chronically on 2 L of oxygen at baseline. - Avoid fluid administration unless necessary

## 2022-09-19 NOTE — Assessment & Plan Note (Deleted)
Diet controlled. CBG this morning 110 -CBGs q4h -avoid insulin

## 2022-09-19 NOTE — Assessment & Plan Note (Addendum)
Remains afebrile. HIV testing non-reactive. Syphilis testing positive. G/C negative. Considering differentials of penile infection/ calciphylaxis vs pulmonary infection as cause of systemic infection. - Urology and nephrology following, appreciate recommendations - Discontinued doxycycline and metronidazole - transitioned from IV ceftriaxone to oral Augmentin for 2 weeks post debridement per ID pharmacy - Penicillin G started 9/10 for 3 doses to treat + Tpall - Continuing to monitor CBC, RFP daily - Bcx NG 4 days. Wound cultures growing E. coli resistant only to ampicillin - Vitals per floor protocol  - Added incentive spirometry

## 2022-09-19 NOTE — Assessment & Plan Note (Addendum)
1-2 weeks of subjective reduced movement and inability to walk. Reports this had been the case since he fell and injured his L knee.  Left-sided weakness noted throughout his chart, unsure how acute this reduced movement is -PT/OT following, appreciate recommendations  -Considering head imaging if neuro exam changes -Pt declines palliative consult at this time

## 2022-09-19 NOTE — Progress Notes (Signed)
3 Days Post-Op Subjective: Continued issues with central line. Planning to transition to PO antibiotics today per discussion with pharmacy.  Patient resting comfortably. Nursing to perform dressing change today.   Objective: Vital signs in last 24 hours: Temp:  [97.6 F (36.4 C)-98.7 F (37.1 C)] 98.1 F (36.7 C) (09/11 0700) Pulse Rate:  [67-77] 75 (09/11 0700) Resp:  [14-22] 22 (09/11 0700) BP: (84-111)/(51-70) 103/61 (09/11 0700) SpO2:  [90 %-100 %] 96 % (09/11 0700) Weight:  [72.2 kg-72.8 kg] 72.2 kg (09/11 0622)  Assessment/Plan:  This is a 62 y.o. male with a history of asthma, ESRD on dialysis (history of prior failed transplant), COPD, diabetes, esophageal varices, stroke with residual hemiparesis, HTN, HFrEF, pAfib on eliquis who presented with weakness x2 weeks. He had significant penile necrosis and purulence on arrival. Now s/p OR debridement on 09/16/22.   #penile necrosis/purulence - Nursing to change dressing daily starting today (order placed).  - saline gauze over glans with kerlix fluffs - Wound cultures growing E coli resistant only to ampicillin - confirmatory syphilis test (FT-Abs) positive. Penicillin G ordered per primary  - Transitioning to PO augmentin x2 weeks from debridement per discussion with pharmacy - surgical pathology pending - No foley indicated - No further wound debridement at this time - Will arrange for outpatient follow up in urology clinic  Intake/Output from previous day: 09/10 0701 - 09/11 0700 In: 320.2 [P.O.:120; IV Piggyback:200.2] Out: 500   Intake/Output this shift: No intake/output data recorded.  Physical Exam:  General: somnolent CV: No cyanosis Lungs: equal chest rise Abdomen: Soft, NTND, no rebound or guarding Skin: poorly vascularized lower extremities with left knee wound Gu: minimal remaining glans tissue. Surgical site edges appear healthy with bleeding edges, no evidence of purulence.   Lab Results: Recent  Labs    09/17/22 0140 09/18/22 1144  HGB 10.6* 10.0*  HCT 35.4* 33.3*   BMET Recent Labs    09/17/22 0340 09/18/22 1144  NA 133* 131*  133*  K 4.2 3.7  3.7  CL 94* 96*  98  CO2 22 23  24   GLUCOSE 131* 111*  112*  BUN 29* 32*  31*  CREATININE 6.22* 5.04*  4.99*  CALCIUM 9.2 9.3  9.4     Studies/Results: DG Chest 1 View  Result Date: 09/18/2022 CLINICAL DATA:  Central line care EXAM: CHEST  1 VIEW COMPARISON:  09/17/2022, 09/18/2022, 09/16/2022, 09/15/2022 FINDINGS: Left-sided dialysis catheter with tip at the cavoatrial region. Marked degree of patient rotation. Additional left IJ central venous catheter with tip projecting over expected brachiocephalic region. Cardiomegaly with similar heterogeneous bilateral airspace disease and right-sided pleural effusion. Vertical opacity over the right paravertebral region may be an artifact. IMPRESSION: 1. Left-sided dialysis catheter with tip at the cavoatrial region. Additional left IJ central venous catheter with tip projecting over expected location of left brachiocephalic vein. 2. Cardiomegaly with similar heterogeneous bilateral airspace disease and right pleural effusion. Not much interval change since recent radiographs. Electronically Signed   By: Jasmine Pang M.D.   On: 09/18/2022 21:36   DG CHEST PORT 1 VIEW  Result Date: 09/18/2022 CLINICAL DATA:  [id number] Central line complication [id number] EXAM: PORTABLE CHEST 1 VIEW COMPARISON:  CXR 09/17/22 FINDINGS: Left-sided dialysis catheter with the tip right atrium. There is a additional left IJ central venous catheter which projects over the expected location of the left brachiocephalic vein. If there is clinical concern for malpositioning, further evaluation with a two-view chest radiograph is recommended. Cardiomegaly. Small  right pleural effusion. Hazy bibasilar airspace opacity could represent atelectasis or infection. No radiographically apparent new displaced rib fracture. This is a  nonspecific cortical irregularity along the lateral aspect of the right sixth rib, may be due to prior. Mild gastric gaseous distention. IMPRESSION: 1. Left-sided dialysis catheter with the tip in the right atrium. There is a additional left IJ central venous catheter which projects over the expected location of the left brachiocephalic vein. If there is clinical concern for malpositioning, further evaluation with a two-view chest radiograph is recommended. 2. Small right pleural effusion. Hazy bibasilar airspace opacity could represent atelectasis or infection. Electronically Signed   By: Lorenza Cambridge M.D.   On: 09/18/2022 08:09   DG CHEST PORT 1 VIEW  Result Date: 09/17/2022 CLINICAL DATA:  Central line complication. EXAM: PORTABLE CHEST 1 VIEW COMPARISON:  09/17/2022. FINDINGS: Heart is enlarged and the mediastinal contour stable. There is atherosclerotic calcification of the aorta. Airspace opacities are noted bilaterally. No effusion or pneumothorax is seen. A proximal left internal jugular catheter loops over the upper left lung field with the tip in the upper SVC. A left dialysis catheter terminates over the right atrium. No acute osseous abnormality. Vascular calcifications are noted in the axillary regions bilaterally. IMPRESSION: 1. Stable patchy airspace disease in the lungs bilaterally. 2. Looping of the proximal left internal jugular central venous catheter, loop may be external to the insertion site. Clinical correlation is recommended. 3. Left internal jugular dialysis catheter is stable in position. Electronically Signed   By: Thornell Sartorius M.D.   On: 09/17/2022 23:17      LOS: 4 days   Cathren Harsh, MD [facility] Resident  [facility]   Dr. Liliane Shi is the attending of record for this consult.    09/19/2022, 8:13 AM

## 2022-09-19 NOTE — Assessment & Plan Note (Addendum)
Most recent creatinine 5.04. TTS schedule. Received hemodialysis 9/9 without complications. Will receive HD tomorrow. Hemodialysis initiated in 2013. - Continue midodrine 10 mg 3 times daily - RFP - Consulted nephrology, appreciate recommendations - Continuing to avoid nephrotoxic drugs - Sucroferric oxyhydroxide 1000mg  BID w meals

## 2022-09-19 NOTE — NC FL2 (Addendum)
[place] MEDICAID FL2 LEVEL OF CARE FORM     IDENTIFICATION  Patient Name: Nathaniel White Birthdate: 1960-05-15 Sex: male Admission Date (Current Location): 09/15/2022  [facility] and [place] Number:  Producer, television/film/video and Address:  [facility], [address] [place], [place] [address]      Provider Number: [id number]  Attending Physician Name and Address:  Latrelle Dodrill, MD  Relative Name and Phone Number:       Current Level of Care: Hospital Recommended Level of Care: Skilled Nursing Facility Prior Approval Number:    Date Approved/Denied:   PASRR Number: [id number]  Discharge Plan: SNF    Current Diagnoses: Patient Active Problem List   Diagnosis Date Noted   First degree heart block 09/17/2022   Puncture wound of left knee with complication 09/15/2022   Open wound of penis with complication 09/15/2022   Sepsis secondary to penile infection 09/15/2022   Weakness generalized 09/15/2022   Diabetes mellitus without complication (HCC) 09/15/2022   Encounter for screening for respiratory tuberculosis 08/20/2022   Mitral regurgitation 07/03/2022   Ventricular tachycardia, unspecified (HCC) 06/20/2022   Palpitations 06/19/2022   Volume overload 09/14/2021   COPD (chronic obstructive pulmonary disease) (HCC)    Pulmonary embolus, right (HCC) 09/07/2021   Severe pulmonary hypertension (HCC) 09/07/2021   End-stage renal disease on hemodialysis (HCC) 09/07/2021   Diabetes mellitus type 2, controlled, without complications (HCC) 09/07/2021   Anemia of chronic disease 09/07/2021   Shortness of breath 09/04/2021   Acute pulmonary embolism (HCC) 09/04/2021   Pulmonary embolism (HCC) 09/04/2021   NSVT (nonsustained ventricular tachycardia) (HCC) 06/26/2021   Hypoglycemia 06/24/2021   Cirrhosis (HCC) 06/23/2021   Confusion 06/22/2021   Necrotizing pneumonia (HCC) 06/21/2021   CVA (cerebral vascular accident) (HCC) 06/21/2021    Paroxysmal atrial fibrillation (HCC) 06/21/2021   Chronic hypotension 06/21/2021   Unusual Appearance with Innumerable Lesions on CT scan, needs MRI 06/21/2021   Aortic atherosclerosis (HCC) 06/21/2021   ESRD (end stage renal disease) (HCC) 06/20/2021   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 (HCC) 06/20/2021   Nausea with vomiting, unspecified 06/15/2021   Acute respiratory failure (HCC) 03/10/2021   Shock circulatory (HCC) 03/10/2021   Hemodialysis-associated hypotension 03/10/2021   Finger infection    Paronychia of finger, left 02/03/2021   Left hand pain 12/29/2020   Allergy, unspecified, initial encounter 09/28/2019   Anaphylactic shock, unspecified, initial encounter 09/28/2019   Pressure ulcer of ankle 09/01/2018   Thrombocytopenia (HCC) 01/17/2015   Transfusion history 01/17/2015   Hypercalcemia 11/01/2014   Dependence on renal dialysis (HCC) 03/26/2014   Encounter for fitting and adjustment of extracorporeal dialysis catheter (HCC) 03/26/2014   Diarrhea, unspecified 01/03/2014   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/10/2013   Pain, unspecified 09/23/2013   Cough 09/07/2013   HFrEF (heart failure with reduced ejection fraction) (HCC) 08/14/2013   Coagulation defect, unspecified (HCC) 12/15/2012   Moderate protein-calorie malnutrition (HCC) 08/07/2012   Restrictive lung disease 07/06/2012   History of CVA (cerebrovascular accident) 08/13/2011   ESRD (end stage renal disease) on dialysis (HCC) 03/13/2011   HTN (hypertension) 03/13/2011   H/O 03/13/2011   Anemia 03/13/2011   Fluid overload, unspecified 03/13/2011   History of nonadherence to medical treatment 03/13/2011   Type 2 diabetes mellitus (HCC) 03/13/2011   Tobacco abuse 03/13/2011   Kidney transplant status 11/28/2008   HEMATOCHEZIA 10/04/2008   Iron deficiency anemia, unspecified 03/31/2003   Patient's noncompliance with other medical treatment and  regimen 03/31/2003    Personal history of nicotine dependence 03/31/2003   Secondary hyperparathyroidism of renal origin (HCC) 03/31/2003    Orientation RESPIRATION BLADDER Height & Weight     Self, Time, Situation, Place  O2 (Baseline  2L) Continent Weight: 159 lb 2.8 oz (72.2 kg) Height:  5\' 9"  (175.3 cm)  BEHAVIORAL SYMPTOMS/MOOD NEUROLOGICAL BOWEL NUTRITION STATUS      Continent Diet (See DC summary)  AMBULATORY STATUS COMMUNICATION OF NEEDS Skin   Extensive Assist Verbally PU Stage and Appropriate Care (PU ulcer on Buttocks. Penis Injury)                       Personal Care Assistance Level of Assistance  Bathing, Feeding, Dressing Bathing Assistance: Maximum assistance Feeding assistance: Limited assistance Dressing Assistance: Maximum assistance     Functional Limitations Info  Sight, Hearing, Speech Sight Info: Adequate Hearing Info: Adequate Speech Info: Adequate    SPECIAL CARE FACTORS FREQUENCY  PT (By licensed PT), OT (By licensed OT)     PT Frequency: 5x week OT Frequency: 5x week            Contractures Contractures Info: Present (stroke w/ residual hemiparesis)    Additional Factors Info  Code Status, Allergies Code Status Info: Full Allergies Info: Codeine  Dextromethorphan-guaifenesin  Iron Dextran  Morphine And Codeine           Current Medications (09/19/2022):  This is the current hospital active medication list Current Facility-Administered Medications  Medication Dose Route Frequency Provider Last Rate Last Admin   acetaminophen (TYLENOL) tablet 1,000 mg  1,000 mg Oral Q6H PRN Lincoln Brigham, MD       albuterol (PROVENTIL) (2.5 MG/3ML) 0.083% nebulizer solution 2.5 mg  2.5 mg Inhalation Q6H PRN Cathren Harsh, MD   2.5 mg at 09/16/22 0445   amiodarone (PACERONE) tablet 200 mg  200 mg Oral Daily Cathren Harsh, MD   200 mg at 09/19/22 1005   amoxicillin-clavulanate (AUGMENTIN) 500-125 MG per tablet 1 tablet  1 tablet Oral Q24H Everhart, Kirstie, DO   1 tablet at  09/18/22 1536   apixaban (ELIQUIS) tablet 5 mg  5 mg Oral BID Lincoln Brigham, MD   5 mg at 09/19/22 1005   benzonatate (TESSALON) capsule 100 mg  100 mg Oral Daily PRN Cathren Harsh, MD   100 mg at 09/17/22 1621   Chlorhexidine Gluconate Cloth 2 % PADS 6 each  6 each Topical Q0600 Cathren Harsh, MD   6 each at 09/19/22 0632   dextrose 50 % solution 12.5 g  12.5 g Intravenous Once Cathren Harsh, MD       hydrOXYzine (ATARAX) tablet 10 mg  10 mg Oral TID PRN Erick Alley, DO   10 mg at 09/18/22 2127   influenza vac split trivalent PF (FLULAVAL) injection 0.5 mL  0.5 mL Intramuscular Tomorrow-1000 Cathren Harsh, MD       midodrine (PROAMATINE) tablet 10 mg  10 mg Oral TID WC Cathren Harsh, MD   10 mg at 09/19/22 1005   mometasone-formoterol (DULERA) 200-5 MCG/ACT inhaler 2 puff  2 puff Inhalation BID Cathren Harsh, MD   2 puff at 09/19/22 0824   penicillin g benzathine (BICILLIN LA) 1200000 UNIT/2ML injection 2.4 Million Units  2.4 Million Units Intramuscular Weekly Alfredo Martinez, MD   2.4 Million Units at 09/18/22 1534   sodium thiosulfate 25 g in sodium chloride 0.9 % 200 mL Infusion for Calciphylaxis  25 g Intravenous Q T,Th,Sa-HD Sabra Heck  B, MD   Stopping previously hung infusion at 09/18/22 1543   sucroferric oxyhydroxide (VELPHORO) chewable tablet 1,000 mg  1,000 mg Oral BID PC Cathren Harsh, MD   1,000 mg at 09/19/22 1004     Discharge Medications: Please see discharge summary for a list of discharge medications.  Relevant Imaging Results:  Relevant Lab Results:   Additional Information SS# [id number] out-pt HD at [facility] on TTS 6:35 am chair time.   Carley Hammed, LCSW

## 2022-09-19 NOTE — Assessment & Plan Note (Deleted)
Rates controlled. - apixaban 5 mg BID

## 2022-09-19 NOTE — Progress Notes (Signed)
Physical Therapy Treatment Patient Details Name: COULTER GALLER MRN: [id number] DOB: 13-Dec-1960 Today's Date: 09/19/2022   History of Present Illness Pt is 62 year old presented to [facility] on  09/15/22 for sepsis due to penile infection.Urology with bedside debridement on 9/7 and taking to OR for debridement on 9/8. 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 Pt also with painful lt knee wound from fall several weeks ago. PMH - stroke (residual L side weakness), ESRD (HD TTS), HTN, PVD, COPD, CHF, asthma, anxiety.    PT Comments  Pt slow to progress due to fears? and pain.  Emphasis on transitioning up to sitting, scooting with R UE assist, sitting balance in general and squat pivot transfer to the recliner, all with max to total assist.     If plan is discharge home, recommend the following: A lot of help with walking and/or transfers;A lot of help with bathing/dressing/bathroom;Assistance with cooking/housework;Assist for transportation   Can travel by private vehicle     No  Equipment Recommendations  Hoyer lift    Recommendations for Other Services       Precautions / Restrictions Precautions Precautions: Fall Precaution Comments: 2L baseline Restrictions Weight Bearing Restrictions: No     Mobility  Bed Mobility Overal bed mobility: Needs Assistance Bed Mobility: Supine to Sit     Supine to sit: Max assist, Total assist     General bed mobility comments: pt finding it difficult to build any momentum and unable to position R UE/hand in a good biomechanical spot to assist    Transfers Overall transfer level: Needs assistance   Transfers: Bed to chair/wheelchair/BSC       Squat pivot transfers: Total assist     General transfer comment: pt balked at trying, so took over and transfered with total assist, pt's hand on opposite arm rest.    Ambulation/Gait                   Stairs             Wheelchair Mobility     Tilt Bed     Modified Rankin (Stroke Patients Only)       Balance   Sitting-balance support: No upper extremity supported, Single extremity supported, Feet supported Sitting balance-Leahy Scale: Fair                                      Cognition Arousal: Alert Behavior During Therapy: WFL for tasks assessed/performed Overall Cognitive Status:  (NT formally)                                          Exercises Other Exercises Other Exercises: Work on L LE knee/hip AAROM  110 deg flexion/  -10 degs ext    General Comments General comments (skin integrity, edema, etc.): on RA, sats slowly decreased into around 91/92%, reapplied O2      Pertinent Vitals/Pain Pain Assessment Pain Assessment: Faces Faces Pain Scale: Hurts little more Pain Location: L knee, L side generally Pain Descriptors / Indicators: Discomfort, Grimacing, Guarding Pain Intervention(s): Monitored during session, Limited activity within patient's tolerance    Home Living                          Prior Function  PT Goals (current goals can now be found in the care plan section) Acute Rehab PT Goals PT Goal Formulation: With patient Time For Goal Achievement: 09/30/22 Potential to Achieve Goals: Fair Progress towards PT goals: Progressing toward goals    Frequency    Min 1X/week      PT Plan      Co-evaluation              AM-PAC PT "6 Clicks" Mobility   Outcome Measure  Help needed turning from your back to your side while in a flat bed without using bedrails?: Total Help needed moving from lying on your back to sitting on the side of a flat bed without using bedrails?: Total Help needed moving to and from a bed to a chair (including a wheelchair)?: Total Help needed standing up from a chair using your arms (e.g., wheelchair or bedside chair)?: Total Help needed to walk in hospital room?: Total Help needed climbing 3-5 steps with a railing?  : Total 6 Click Score: 6    End of Session Equipment Utilized During Treatment: Oxygen Activity Tolerance: Patient limited by pain (appears fearful of giving it all.) Patient left: in chair;with call bell/phone within reach (lift equipment) Nurse Communication: Mobility status PT Visit Diagnosis: Other abnormalities of gait and mobility (R26.89);Muscle weakness (generalized) (M62.81);History of falling (Z91.81);Hemiplegia and hemiparesis;Pain Hemiplegia - Right/Left: Left Hemiplegia - dominant/non-dominant: Dominant Pain - Right/Left: Left Pain - part of body: Knee     Time: 1220-1240 PT Time Calculation (min) (ACUTE ONLY): 20 min  Charges:    $Therapeutic Activity: 8-22 mins PT General Charges $$ ACUTE PT VISIT: 1 Visit                     09/19/2022  Jacinto Halim., PT Acute Rehabilitation Services [phone]  (office)   Eliseo Gum Jeff Frieden 09/19/2022, 12:48 PM

## 2022-09-19 NOTE — Plan of Care (Signed)
  Problem: Education: Goal: Knowledge of General Education information will improve Description: Including pain rating scale, medication(s)/side effects and non-pharmacologic comfort measures Outcome: Progressing   Problem: Health Behavior/Discharge Planning: Goal: Ability to manage health-related needs will improve Outcome: Progressing   Problem: Clinical Measurements: Goal: Ability to maintain clinical measurements within normal limits will improve Outcome: Progressing Goal: Will remain free from infection Outcome: Progressing Goal: Diagnostic test results will improve Outcome: Progressing Goal: Respiratory complications will improve Outcome: Progressing Goal: Cardiovascular complication will be avoided Outcome: Progressing   Problem: Activity: Goal: Risk for activity intolerance will decrease Outcome: Progressing   Problem: Coping: Goal: Level of anxiety will decrease Outcome: Progressing   Problem: Elimination: Goal: Will not experience complications related to bowel motility Outcome: Progressing   Problem: Pain Managment: Goal: General experience of comfort will improve Outcome: Progressing   Problem: Safety: Goal: Ability to remain free from injury will improve Outcome: Progressing   Problem: Skin Integrity: Goal: Risk for impaired skin integrity will decrease Outcome: Progressing   Problem: Education: Goal: Understanding of CV disease, CV risk reduction, and recovery process will improve Outcome: Progressing Goal: Individualized Educational Video(s) Outcome: Progressing   Problem: Activity: Goal: Ability to return to baseline activity level will improve Outcome: Progressing   Problem: Cardiovascular: Goal: Ability to achieve and maintain adequate cardiovascular perfusion will improve Outcome: Progressing Goal: Vascular access site(s) Level 0-1 will be maintained Outcome: Progressing   Problem: Safety: Goal: Non-violent Restraint(s) Outcome:  Progressing

## 2022-09-19 NOTE — Assessment & Plan Note (Addendum)
-   Pt with intermittent Mobitz type I block (Wenckebach) overnight -Cardiology consulted initially, did not recommend formal follow up at this time -Continue home 2L O2 via [place]

## 2022-09-19 NOTE — TOC Initial Note (Signed)
Transition of Care [facility]) - Initial/Assessment Note    Patient Details  Name: Nathaniel White MRN: [id number] Date of Birth: 1960-04-01  Transition of Care [facility]) CM/SW Contact:    Carley Hammed, LCSW Phone Number: 09/19/2022, 11:46 AM  Clinical Narrative:                 CSW met with pt and RN Patrice to discuss DC planning. CSW explained rec for SNF and initially pt declined. Patrice educated pt at length about the need for SNF and the possible consequences to going home and not having adequate care. Appreciate Patrice and her expertise as well as her rapport with the pt. Pt states he has home O2 with [employer]. Pt notes no preference for facility, agreeable to a faxout. Pt declines CSW speaking with any friends or family. CSW to complete workup and fax pt out for bed offers. TOC will continue to follow for DC needs.  Expected Discharge Plan: Skilled Nursing Facility Barriers to Discharge: Continued Medical Work up, English as a second language teacher, SNF Pending bed offer   Patient Goals and CMS Choice Patient states their goals for this hospitalization and ongoing recovery are:: Pt agreeable to recieving rehab services at [facility]. CMS Medicare.gov Compare Post Acute Care list provided to:: Patient Choice offered to / list presented to : Patient      Expected Discharge Plan and Services     Post Acute Care Choice: Skilled Nursing Facility Living arrangements for the past 2 months: Single Family Home                                      Prior Living Arrangements/Services Living arrangements for the past 2 months: Single Family Home Lives with:: Friends Patient language and need for interpreter reviewed:: Yes Do you feel safe going back to the place where you live?: Yes      Need for Family Participation in Patient Care: Yes (Comment) Care giver support system in place?: No (comment)   Criminal Activity/Legal Involvement Pertinent to Current Situation/Hospitalization: No - Comment as  needed  Activities of Daily Living Home Assistive Devices/Equipment: None ADL Screening (condition at time of admission) Patient's cognitive ability adequate to safely complete daily activities?: Yes Is the patient deaf or have difficulty hearing?: No Does the patient have difficulty seeing, even when wearing glasses/contacts?: No Does the patient have difficulty concentrating, remembering, or making decisions?: Yes Patient able to express need for assistance with ADLs?: Yes Does the patient have difficulty dressing or bathing?: Yes Independently performs ADLs?: No Communication: Independent Dressing (OT): Needs assistance Is this a change from baseline?: Pre-admission baseline Grooming: Needs assistance Is this a change from baseline?: Pre-admission baseline Feeding: Independent with device (comment) Bathing: Needs assistance Is this a change from baseline?: Pre-admission baseline Toileting: Needs assistance Is this a change from baseline?: Pre-admission baseline In/Out Bed: Needs assistance Is this a change from baseline?: Pre-admission baseline Walks in Home: Needs assistance Is this a change from baseline?: Pre-admission baseline Does the patient have difficulty walking or climbing stairs?: Yes Weakness of Legs: Both Weakness of Arms/Hands: Both  Permission Sought/Granted Permission sought to share information with : Family Supports Permission granted to share information with : No              Emotional Assessment Appearance:: Appears stated age Attitude/Demeanor/Rapport: Engaged Affect (typically observed): Appropriate Orientation: : Oriented to Self, Oriented to Place, Oriented to  Time, Oriented to Situation Alcohol / Substance Use: Not Applicable Psych Involvement: No (comment)  Admission diagnosis:  Paroxysmal atrial fibrillation (HCC) [I48.0] Weakness generalized [R53.1] Weakness [R53.1] ESRD (end stage renal disease) (HCC) [N18.6] Moderate protein-calorie  malnutrition (HCC) [E44.0] Puncture wound of left knee with complication, initial encounter [S81.032A] Open wound of penis with complication, initial encounter [S31.20XA] HFrEF (heart failure with reduced ejection fraction) (HCC) [I50.20] Chronic obstructive pulmonary disease, unspecified COPD type (HCC) [J44.9] Sepsis, due to unspecified organism, unspecified whether acute organ dysfunction present [facility]) [A41.9] Patient Active Problem List   Diagnosis Date Noted   First degree heart block 09/17/2022   Puncture wound of left knee with complication 09/15/2022   Open wound of penis with complication 09/15/2022   Sepsis secondary to penile infection 09/15/2022   Weakness generalized 09/15/2022   Diabetes mellitus without complication (HCC) 09/15/2022   Encounter for screening for respiratory tuberculosis 08/20/2022   Mitral regurgitation 07/03/2022   Ventricular tachycardia, unspecified (HCC) 06/20/2022   Palpitations 06/19/2022   Volume overload 09/14/2021   COPD (chronic obstructive pulmonary disease) (HCC)    Pulmonary embolus, right (HCC) 09/07/2021   Severe pulmonary hypertension (HCC) 09/07/2021   End-stage renal disease on hemodialysis (HCC) 09/07/2021   Diabetes mellitus type 2, controlled, without complications (HCC) 09/07/2021   Anemia of chronic disease 09/07/2021   Shortness of breath 09/04/2021   Acute pulmonary embolism (HCC) 09/04/2021   Pulmonary embolism (HCC) 09/04/2021   NSVT (nonsustained ventricular tachycardia) (HCC) 06/26/2021   Hypoglycemia 06/24/2021   Cirrhosis (HCC) 06/23/2021   Confusion 06/22/2021   Necrotizing pneumonia (HCC) 06/21/2021   CVA (cerebral vascular accident) (HCC) 06/21/2021   Paroxysmal atrial fibrillation (HCC) 06/21/2021   Chronic hypotension 06/21/2021   Unusual Appearance with Innumerable Lesions on CT scan, needs MRI 06/21/2021   Aortic atherosclerosis (HCC) 06/21/2021   ESRD (end stage renal disease) (HCC) 06/20/2021   Bleeding  pseudoaneurysm of left brachiocephalic arteriovenous fistula (HCC) 06/20/2021   Nausea with vomiting, unspecified 06/15/2021   Acute respiratory failure (HCC) 03/10/2021   Shock circulatory (HCC) 03/10/2021   Hemodialysis-associated hypotension 03/10/2021   Finger infection    Paronychia of finger, left 02/03/2021   Left hand pain 12/29/2020   Allergy, unspecified, initial encounter 09/28/2019   Anaphylactic shock, unspecified, initial encounter 09/28/2019   Pressure ulcer of ankle 09/01/2018   Thrombocytopenia (HCC) 01/17/2015   Transfusion history 01/17/2015   Hypercalcemia 11/01/2014   Dependence on renal dialysis (HCC) 03/26/2014   Encounter for fitting and adjustment of extracorporeal dialysis catheter (HCC) 03/26/2014   Diarrhea, unspecified 01/03/2014   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/10/2013   Pain, unspecified 09/23/2013   Cough 09/07/2013   HFrEF (heart failure with reduced ejection fraction) (HCC) 08/14/2013   Coagulation defect, unspecified (HCC) 12/15/2012   Moderate protein-calorie malnutrition (HCC) 08/07/2012   Restrictive lung disease 07/06/2012   History of CVA (cerebrovascular accident) 08/13/2011   ESRD (end stage renal disease) on dialysis (HCC) 03/13/2011   HTN (hypertension) 03/13/2011   H/O 03/13/2011   Anemia 03/13/2011   Fluid overload, unspecified 03/13/2011   History of nonadherence to medical treatment 03/13/2011   Type 2 diabetes mellitus (HCC) 03/13/2011   Tobacco abuse 03/13/2011   Kidney transplant status 11/28/2008   HEMATOCHEZIA 10/04/2008   Iron deficiency anemia, unspecified 03/31/2003   Patient's noncompliance with other medical treatment and regimen 03/31/2003   Personal history of nicotine dependence 03/31/2003   Secondary hyperparathyroidism of renal origin (HCC) 03/31/2003   PCP:  Claiborne Rigg, NP Pharmacy:  [id number] [place], [place] - [address] AT [facility] [address] [place]  [address] Phone: [phone] Fax: [phone]  [facility] #[id number] [place], [place] - [address] AT [facility] [address] & [address] [place] [address] Phone: [phone] Fax: [phone]  [facility] - [facility] [address] [place] [address] Phone: [phone] Fax: [phone]  [facility] [address] [place] [address] Phone: [phone] Fax: [phone]     Social Determinants of Health (SDOH) Social History: SDOH Screenings   Food Insecurity: No Food Insecurity (09/16/2022)  Housing: Low Risk  (09/16/2022)  Transportation Needs: No Transportation Needs (09/16/2022)  Utilities: Not At Risk (09/16/2022)  Alcohol Screen: Low Risk  (07/18/2022)  Depression (PHQ2-9): Low Risk  (07/18/2022)  Financial Resource Strain: Low Risk  (07/18/2022)  Physical Activity: Inactive (07/18/2022)  Social Connections: Moderately Isolated (07/18/2022)  Stress: No Stress Concern Present (07/18/2022)  Tobacco Use: Medium Risk (09/16/2022)   SDOH Interventions:     Readmission Risk Interventions    09/15/2021   10:09 AM 09/06/2021    4:20 PM 06/23/2021    3:52 PM  Readmission Risk Prevention Plan  Transportation Screening Complete Complete Complete  PCP or Specialist Appt within 3-5 Days Complete Complete Complete  HRI or Home Care Consult Complete Complete Complete  Social Work Consult for Recovery Care Planning/Counseling Complete Complete Complete  Palliative Care Screening Not Applicable Not Applicable Not Applicable  Medication Review Oceanographer) Complete Complete Complete

## 2022-09-19 NOTE — Assessment & Plan Note (Addendum)
Pt requires a renal-diet, but has signs of muscle wasting and inadequate food intake. - Nutrition consulted

## 2022-09-19 NOTE — Progress Notes (Addendum)
Daily Progress Note Intern Pager: [phone]  Patient name: Nathaniel White Medical record number: [id number] Date of birth: 1960-06-17 Age: 62 y.o. Gender: male  Primary Care Provider: Claiborne Rigg, NP Consultants: urology, nephrology Code Status: full  Pt Overview and Major Events to Date:  9/7: Admitted 9/8: Surgical Debridement / partial penectomy  Assessment and Plan:  61 year old male with past medical history asthma, ESRD on dialysis with history of failed transplant, COPD, T2DM, esophageal varices, stroke w/ residual hemiparesis, HTN, HFrEF, paroxysmal A-fib on elliquis, VT, anemia of chronic disease who presented with weakness and purulent penile necrosis.   Patient transitioned to oral Augmentin x 2 weeks. Will have daily dressing changes by nursing starting today.  Also being treated with penicillin G for syphilis.  Syphilis was reported to [facility] department.  Patient told to notify sexual partners and he says he will.  TOC following for placement to SNF. Assessment & Plan Sepsis secondary to penile infection Remains afebrile. HIV testing non-reactive. Syphilis testing positive. G/C negative. Considering differentials of penile infection/ calciphylaxis vs pulmonary infection as cause of systemic infection. - Urology and nephrology following, appreciate recommendations - Discontinued doxycycline and metronidazole - transitioned from IV ceftriaxone to oral Augmentin for 2 weeks post debridement per ID pharmacy - Penicillin G started 9/10 for 3 doses to treat + Tpall - Continuing to monitor CBC, RFP daily - Bcx NG 4 days. Wound cultures growing E. coli resistant only to ampicillin - Vitals per floor protocol  - Added incentive spirometry  Puncture wound of left knee with complication Abrasion over the left knee. Clinically improving. No gout history, maintaining broad differential with possibility of infectious/ inflammatory /traumatic causes. Contracture on  left side secondary to prior CVA but worsened with knee injury. MR w/o contrast L knee nondiagnostic but no soft tissue swelling or fluid collections that were seen. Small knee joint effusion. - Continue abx as above  - stable  Weakness generalized 1-2 weeks of subjective reduced movement and inability to walk. Reports this had been the case since he fell and injured his L knee.  Left-sided weakness noted throughout his chart, unsure how acute this reduced movement is -PT/OT following, appreciate recommendations  -Considering head imaging if neuro exam changes -Pt declines palliative consult at this time ESRD (end stage renal disease) (HCC) Most recent creatinine 5.04. TTS schedule. Received hemodialysis 9/9 without complications. Will receive HD tomorrow. Hemodialysis initiated in 2013. - Continue midodrine 10 mg 3 times daily - RFP - Consulted nephrology, appreciate recommendations - Continuing to avoid nephrotoxic drugs - Sucroferric oxyhydroxide 1000mg  BID w meals HFrEF (heart failure with reduced ejection fraction) (HCC) CXR with atelectasis v. Infection.  Chronically on 2 L of oxygen at baseline. - Avoid fluid administration unless necessary First degree heart block - Pt with intermittent Mobitz type I block (Wenckebach) overnight -Cardiology consulted initially, did not recommend formal follow up at this time -Continue home 2L O2 via [place] Moderate protein-calorie malnutrition (HCC) Pt requires a renal-diet, but has signs of muscle wasting and inadequate food intake. - Nutrition consulted   Chronic and Stable Problems:  HFrEF: Chronically on 2L O2 via [place]. Avoid fluids unless necessary Paroxysmal A-fib: Apixaban 5 mg BID COPD: Albuterol 2.5 mg Q6h PRN. Mometasone-formoterol 200-5, inhalation BID. T2DM: Diet controlled. Hypoglycemic episodes on admission resolved, avoiding insulin.   FEN/GI: renal diet w/ fluid restriction PPx: eliquis Dispo:SNF Barriers include placement.    Subjective:  Central line removed yesterday.  States he feels  overall stiff from being in the hospital bed but is unable to walk due to his procedure.  Physical therapy will be by to work with patient today.  Patient denies pain.  No other complaints.  Objective: Temp:  [97.8 F (36.6 C)-98.7 F (37.1 C)] 98.1 F (36.7 C) (09/11 0700) Pulse Rate:  [67-82] 82 (09/11 0824) Resp:  [14-22] 19 (09/11 0824) BP: (84-106)/(51-70) 103/61 (09/11 0700) SpO2:  [90 %-98 %] 96 % (09/11 0700) Weight:  [72.2 kg-72.3 kg] 72.2 kg (09/11 0622) Physical Exam: General: No acute distress, resting in bed Cardiovascular: Regular rate and rhythm, no murmurs Respiratory: Rhonchi noted left lower lung base.  Otherwise clear to auscultation bilaterally.  No increased work of breathing. Abdomen: Soft, nondistended Extremities: Left lower extremity bent, as it has been most of his hospital stay.  Abrasion noted over left knee that is healing.  No swelling bilateral lower extremities.  No TTP bilateral lower extremities  Laboratory: Most recent CBC Lab Results  Component Value Date   WBC 9.8 09/19/2022   HGB 11.2 (L) 09/19/2022   HCT 35.3 (L) 09/19/2022   MCV 87.6 09/19/2022   PLT 162 09/19/2022   Most recent BMP    Latest Ref Rng & Units 09/19/2022    7:14 AM  BMP  Glucose 70 - 99 mg/dL 80   BUN 6 - 20 mg/dL 27   Creatinine 7.61 - 1.24 mg/dL 6.07   Sodium 371 - 062 mmol/L 136   Potassium 3.5 - 5.1 mmol/L 3.7   Chloride 98 - 111 mmol/L 96   CO2 22 - 32 mmol/L 22   Calcium 8.9 - 10.3 mg/dL 9.5     Imaging/Diagnostic Tests: CXR IMPRESSION: 1. Left-sided dialysis catheter with tip at the cavoatrial region. Additional left IJ central venous catheter with tip projecting over expected location of left brachiocephalic vein. 2. Cardiomegaly with similar heterogeneous bilateral airspace disease and right pleural effusion. Not much interval change since recent radiographs.  Keymora Grillot,  DO 09/19/2022, 10:52 AM  PGY-1, [facility] FPTS Intern pager: [phone], text pages welcome Secure chat group [facility] Teaching Service

## 2022-09-19 NOTE — Progress Notes (Signed)
[facility] Progress Note   Subjective:     HD yesterday with L UF Received Na Thiosulfate No c/o this AM  Objective Vitals:   09/18/22 2357 09/19/22 0000 09/19/22 0622 09/19/22 0700  BP: 103/70 103/70 104/63 103/61  Pulse:  71 73 75  Resp:  17 20 (!) 22  Temp: 98.4 F (36.9 C) 98.4 F (36.9 C) 98.7 F (37.1 C) 98.1 F (36.7 C)  TempSrc: Oral Oral Oral Oral  SpO2:  96% 94% 96%  Weight:   72.2 kg   Height:       Physical Exam General: Awake, alert, NAD, Heart: S1 and S2; No murmurs, gallops, or rubs Lungs: Clear anteriorly, diminished at BL bases Abdomen: Soft and non-tender Extremities: Discolored, trace BL LE edema Dialysis Access: [facility]   [facility] Weights   09/18/22 1048 09/18/22 1408 09/19/22 0622  Weight: 72.8 kg 72.3 kg 72.2 kg    Intake/Output Summary (Last 24 hours) at 09/19/2022 0812 Last data filed at 09/18/2022 2033 Gross per 24 hour  Intake 320.17 ml  Output 500 ml  Net -179.83 ml    Additional Objective Labs: Basic Metabolic Panel: Recent Labs  Lab 09/16/22 0217 09/17/22 0140 09/17/22 0340 09/18/22 1144  NA 132* 134* 133* 131*  133*  K 3.6 4.3 4.2 3.7  3.7  CL 93* 93* 94* 96*  98  CO2 22 23 22 23  24   GLUCOSE 77 137* 131* 111*  112*  BUN 21* 29* 29* 32*  31*  CREATININE 4.95* 6.33* 6.22* 5.04*  4.99*  CALCIUM 8.9 9.4 9.2 9.3  9.4  PHOS 3.4  --  5.9* 4.2   Liver Function Tests: Recent Labs  Lab 09/15/22 0846 09/16/22 0217 09/17/22 0140 09/17/22 0340 09/18/22 1144  AST 40  --  32  --   --   ALT 21  --  18  --   --   ALKPHOS 142*  --  110  --   --   BILITOT 1.8*  --  1.7*  --   --   PROT 7.4  --  7.0  --   --   ALBUMIN 1.8*   < > 2.1* 2.2* 2.2*   < > = values in this interval not displayed.   No results for input(s): "LIPASE", "AMYLASE" in the last 168 hours. CBC: Recent Labs  Lab 09/15/22 0846 09/15/22 0904 09/16/22 0217 09/17/22 0140 09/18/22 1144  WBC 11.4*  --  13.0* 9.3 11.8*  NEUTROABS 9.7*  --    --   --  10.1*  HGB 11.7*   < > 11.8* 10.6* 10.0*  HCT 38.9*   < > 38.0* 35.4* 33.3*  MCV 88.8  --  86.6 89.2 89.8  PLT 190  --  160 164 165   < > = values in this interval not displayed.   Blood Culture    Component Value Date/Time   SDES FLUID 09/16/2022 1617   SPECREQUEST NONE 09/16/2022 1617   CULT  09/16/2022 1617    FEW ESCHERICHIA COLI CULTURE REINCUBATED FOR BETTER GROWTH HOLDING FOR POSSIBLE ANAEROBE Performed at [facility], [address] [place], [place] [address]    REPTSTATUS PENDING 09/16/2022 1617    Cardiac Enzymes: No results for input(s): "CKTOTAL", "CKMB", "CKMBINDEX", "TROPONINI" in the last 168 hours. CBG: Recent Labs  Lab 09/18/22 1433 09/18/22 2036 09/19/22 0008 09/19/22 0621 09/19/22 0716  GLUCAP 83 103* 114* 124* 90   Iron Studies: No results for input(s): "IRON", "TIBC", "  TRANSFERRIN", "FERRITIN" in the last 72 hours. Lab Results  Component Value Date   INR 1.2 06/20/2021   INR 1.4 (H) 06/20/2021   INR 1.0 10/08/2013   Studies/Results: DG Chest 1 View  Result Date: 09/18/2022 CLINICAL DATA:  Central line care EXAM: CHEST  1 VIEW COMPARISON:  09/17/2022, 09/18/2022, 09/16/2022, 09/15/2022 FINDINGS: Left-sided dialysis catheter with tip at the cavoatrial region. Marked degree of patient rotation. Additional left IJ central venous catheter with tip projecting over expected brachiocephalic region. Cardiomegaly with similar heterogeneous bilateral airspace disease and right-sided pleural effusion. Vertical opacity over the right paravertebral region may be an artifact. IMPRESSION: 1. Left-sided dialysis catheter with tip at the cavoatrial region. Additional left IJ central venous catheter with tip projecting over expected location of left brachiocephalic vein. 2. Cardiomegaly with similar heterogeneous bilateral airspace disease and right pleural effusion. Not much interval change since recent radiographs. Electronically Signed   By: Jasmine Pang M.D.   On: 09/18/2022 21:36   DG CHEST PORT 1 VIEW  Result Date: 09/18/2022 CLINICAL DATA:  [id number] Central line complication [id number] EXAM: PORTABLE CHEST 1 VIEW COMPARISON:  CXR 09/17/22 FINDINGS: Left-sided dialysis catheter with the tip right atrium. There is a additional left IJ central venous catheter which projects over the expected location of the left brachiocephalic vein. If there is clinical concern for malpositioning, further evaluation with a two-view chest radiograph is recommended. Cardiomegaly. Small right pleural effusion. Hazy bibasilar airspace opacity could represent atelectasis or infection. No radiographically apparent new displaced rib fracture. This is a nonspecific cortical irregularity along the lateral aspect of the right sixth rib, may be due to prior. Mild gastric gaseous distention. IMPRESSION: 1. Left-sided dialysis catheter with the tip in the right atrium. There is a additional left IJ central venous catheter which projects over the expected location of the left brachiocephalic vein. If there is clinical concern for malpositioning, further evaluation with a two-view chest radiograph is recommended. 2. Small right pleural effusion. Hazy bibasilar airspace opacity could represent atelectasis or infection. Electronically Signed   By: Lorenza Cambridge M.D.   On: 09/18/2022 08:09   DG CHEST PORT 1 VIEW  Result Date: 09/17/2022 CLINICAL DATA:  Central line complication. EXAM: PORTABLE CHEST 1 VIEW COMPARISON:  09/17/2022. FINDINGS: Heart is enlarged and the mediastinal contour stable. There is atherosclerotic calcification of the aorta. Airspace opacities are noted bilaterally. No effusion or pneumothorax is seen. A proximal left internal jugular catheter loops over the upper left lung field with the tip in the upper SVC. A left dialysis catheter terminates over the right atrium. No acute osseous abnormality. Vascular calcifications are noted in the axillary regions bilaterally.  IMPRESSION: 1. Stable patchy airspace disease in the lungs bilaterally. 2. Looping of the proximal left internal jugular central venous catheter, loop may be external to the insertion site. Clinical correlation is recommended. 3. Left internal jugular dialysis catheter is stable in position. Electronically Signed   By: Thornell Sartorius M.D.   On: 09/17/2022 23:17    Medications:  sodium thiosulfate 25 g in sodium chloride 0.9 % 200 mL Infusion for Calciphylaxis Stopped (09/18/22 1543)    amiodarone  200 mg Oral Daily   amoxicillin-clavulanate  1 tablet Oral Q24H   apixaban  5 mg Oral BID   Chlorhexidine Gluconate Cloth  6 each Topical Q0600   dextrose  12.5 g Intravenous Once   influenza vac split trivalent PF  0.5 mL Intramuscular Tomorrow-1000   midodrine  10 mg Oral TID WC  mometasone-formoterol  2 puff Inhalation BID   penicillin g benzathine (BICILLIN-LA) IM  2.4 Million Units Intramuscular Weekly   sucroferric oxyhydroxide  1,000 mg Oral BID PC    Dialysis Orders:  TTS SW  4h  450/ 500   67.5kg  2/2 bath  TDC   Heparin none - last OP HD 9/05, post wt 70.3kg - coming off 3-5kg over for last 3 wks - parsabiv 7.5mg  IV three times per week - no esa or vdra  Home meds include - albuterol, amiodarone, eliquis, advair diskus, midodrine 5mg  1- 2 on non-hd days, singulair, velphoro 1 gm ac tid, prns   Assessment/Plan:   Sepsis - abx per pmd; on ABX, resolving Penile dermal necrosis - seen by urology. 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 Path pending.  Debrided.Start NaThiosulfate qHD 9/10. Avoid Ca based products.  Not on VDRA.  ESRD - on HD TTS. Now on schedule.  Next HD tomorrow: TDC, NO heparin.  Follow weights still above EDW HTN/ volume - Not edematous. BP's are not great, however. Is on midodrine non HD days at home. Midodrine recently raised to 10mg  tid for BP support. Will adjust further if needed. See above. Anemia esrd - Hb 11, no esa needs MBD ckd - CCa in range, P at goal.  Hold off on giving Ca or Vitamin D supplements for now given concern for calciphylaxis.  As above.  On Velphoro H/o CVA GOC - poor prognosis, agree with Urology in proceeding with palliative care consult  Arita Miss, MD  [facility] 09/19/2022,8:12 AM  LOS: 4 days

## 2022-09-19 NOTE — Assessment & Plan Note (Addendum)
Abrasion over the left knee. Clinically improving. No gout history, maintaining broad differential with possibility of infectious/ inflammatory /traumatic causes. Contracture on left side secondary to prior CVA but worsened with knee injury. MR w/o contrast L knee nondiagnostic but no soft tissue swelling or fluid collections that were seen. Small knee joint effusion. - Continue abx as above  - stable

## 2022-09-20 DIAGNOSIS — S3120XA Unspecified open wound of penis, initial encounter: Secondary | ICD-10-CM | POA: Diagnosis not present

## 2022-09-20 LAB — CBC
HCT: 40.7 % (ref 39.0–52.0)
HCT: 37.2 % — ABNORMAL LOW (ref 39.0–52.0)
Hemoglobin: 13 g/dL (ref 13.0–17.0)
Hemoglobin: 11.7 g/dL — ABNORMAL LOW (ref 13.0–17.0)
MCH: 27.7 pg (ref 26.0–34.0)
MCH: 27.1 pg (ref 26.0–34.0)
MCHC: 31.9 g/dL (ref 30.0–36.0)
MCHC: 31.5 g/dL (ref 30.0–36.0)
MCV: 86.8 fL (ref 80.0–100.0)
MCV: 86.3 fL (ref 80.0–100.0)
Platelets: 122 10*3/uL — ABNORMAL LOW (ref 150–400)
Platelets: 164 10*3/uL (ref 150–400)
RBC: 4.69 MIL/uL (ref 4.22–5.81)
RBC: 4.31 MIL/uL (ref 4.22–5.81)
RDW: 19.7 % — ABNORMAL HIGH (ref 11.5–15.5)
RDW: 19.2 % — ABNORMAL HIGH (ref 11.5–15.5)
WBC: 9.8 10*3/uL (ref 4.0–10.5)
WBC: 10.8 10*3/uL — ABNORMAL HIGH (ref 4.0–10.5)
nRBC: 0 % (ref 0.0–0.2)
nRBC: 0 % (ref 0.0–0.2)

## 2022-09-20 LAB — CULTURE, BLOOD (ROUTINE X 2)
Culture: NO GROWTH
Culture: NO GROWTH
Special Requests: ADEQUATE

## 2022-09-20 LAB — RENAL FUNCTION PANEL
Albumin: 2.1 g/dL — ABNORMAL LOW (ref 3.5–5.0)
Anion gap: 20 — ABNORMAL HIGH (ref 5–15)
BUN: 42 mg/dL — ABNORMAL HIGH (ref 6–20)
CO2: 22 mmol/L (ref 22–32)
Calcium: 9.7 mg/dL (ref 8.9–10.3)
Chloride: 94 mmol/L — ABNORMAL LOW (ref 98–111)
Creatinine, Ser: 6.29 mg/dL — ABNORMAL HIGH (ref 0.61–1.24)
GFR, Estimated: 9 mL/min — ABNORMAL LOW (ref >60)
Glucose, Bld: 84 mg/dL (ref 70–99)
Phosphorus: 3.7 mg/dL (ref 2.5–4.6)
Potassium: 4.2 mmol/L (ref 3.5–5.1)
Sodium: 136 mmol/L (ref 135–145)

## 2022-09-20 MED ORDER — HEPARIN SODIUM (PORCINE) 1000 UNIT/ML IJ SOLN
INTRAMUSCULAR | Status: AC
  Administered 2022-09-20: 1000 [IU]
  Filled 2022-09-20: qty 5

## 2022-09-20 MED ORDER — AMOXICILLIN-POT CLAVULANATE 500-125 MG PO TABS
1.0000 | ORAL_TABLET | Freq: Every day | ORAL | Status: DC
  Administered 2022-09-20 – 2022-09-23 (×4): 1 via ORAL
  Filled 2022-09-20 (×5): qty 1

## 2022-09-20 NOTE — Assessment & Plan Note (Signed)
CXR with atelectasis v. Infection.  Chronically on 2 L of oxygen at baseline. - Avoid fluid administration unless necessary

## 2022-09-20 NOTE — Progress Notes (Signed)
Occupational Therapy Treatment Patient Details Name: Nathaniel White MRN: [id number] DOB: 07/12/1960 Today's Date: 09/20/2022   History of present illness Pt is 62 year old presented to [facility] on  09/15/22 for sepsis due to penile infection.Urology with bedside debridement on 9/7 and taking to OR for debridement on 9/8. 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 Pt also with painful lt knee wound from fall several weeks ago. PMH - stroke (residual L side weakness), ESRD (HD TTS), HTN, PVD, COPD, CHF, asthma, anxiety.   OT comments  Pt continuing to progress in OT sessions, he possesses weak core muscles but was able to sit unsupported and engage in dynamic reaching tasks. His L knee remains in a flexed position and he has no ability to lift it against gravity, it is very stiff but also painful with mobility. Pt was reluctant to stand but with OT influence he stood twice but remains total A due to poor balance and weakness. OT to continue to progress pt as able, DC plans remain appropriate for SNF.       If plan is discharge home, recommend the following:  Two people to help with walking and/or transfers;Two people to help with bathing/dressing/bathroom;Assistance with cooking/housework;Direct supervision/assist for medications management;Direct supervision/assist for financial management;Help with stairs or ramp for entrance;Assist for transportation   Equipment Recommendations  Other (comment) (defer)    Recommendations for Other Services      Precautions / Restrictions Precautions Precautions: Fall Precaution Comments: 2L baseline Restrictions Weight Bearing Restrictions: No       Mobility Bed Mobility Overal bed mobility: Needs Assistance Bed Mobility: Sidelying to Sit, Rolling, Sit to Supine Rolling: Used rails, Max assist Sidelying to sit: Mod assist   Sit to supine: Mod assist, Used rails   General bed mobility comments: Pt assisting using RUE and rails when cues, no  active movement from LLE to assist with bed mobility. Pads used to assist, Max A lateral scooting to [facility] with use of pads and cues for pt to pull himself towards rail    Transfers Overall transfer level: Needs assistance   Transfers: Sit to/from Stand Sit to Stand: Total assist           General transfer comment: STS x1 Total A with RW, not able to achieve full stand. Face to face method allowed for full stand but still Total A, LLE pain in standing and pt tends to keep it in a flexed position as it is stiff and not able to fully extend at this time     Balance Overall balance assessment: Needs assistance Sitting-balance support: No upper extremity supported, Single extremity supported, Feet supported Sitting balance-Leahy Scale: Fair       Standing balance-Leahy Scale: Zero                             ADL either performed or assessed with clinical judgement   ADL Overall ADL's : Needs assistance/impaired                                       General ADL Comments: Focused session on improving balance, worked with pt on dynamic reaching tasks 3/3 attempts. Then  reinforced AROM of joints, particularly his stiff L knee    Extremity/Trunk Assessment              Vision  Perception     Praxis      Cognition Arousal: Alert Behavior During Therapy: WFL for tasks assessed/performed Overall Cognitive Status: Within Functional Limits for tasks assessed                                          Exercises General Exercises - Lower Extremity Straight Leg Raises: AROM, Right, Limitations, 5 reps Straight Leg Raises Limitations: No AROM from LLE Hip Flexion/Marching: AROM, Right, Limitations, 5 reps Hip Flexion/Marching Limitations: No AROM from LLE Other Exercises Other Exercises: Pull and hold into long sitting with OT x5 reps    Shoulder Instructions       General Comments      Pertinent Vitals/ Pain        Pain Assessment Pain Assessment: Faces Faces Pain Scale: Hurts even more Pain Location: L knee Pain Descriptors / Indicators: Discomfort, Grimacing, Guarding, Aching Pain Intervention(s): Monitored during session, Limited activity within patient's tolerance, Repositioned  Home Living                                          Prior Functioning/Environment              Frequency  Min 1X/week        Progress Toward Goals  OT Goals(current goals can now be found in the care plan section)  Progress towards OT goals: Progressing toward goals  Acute Rehab OT Goals Patient Stated Goal: none stated OT Goal Formulation: With patient Time For Goal Achievement: 09/30/22 Potential to Achieve Goals: Good ADL Goals Pt Will Perform Grooming: with set-up;sitting Pt Will Transfer to Toilet: stand pivot transfer;bedside commode;with mod assist Additional ADL Goal #1: Pt will independently verablize need for assist with rolling to complete pressure relief  Plan      Co-evaluation                 AM-PAC OT "6 Clicks" Daily Activity     Outcome Measure   Help from another person eating meals?: A Little Help from another person taking care of personal grooming?: A Little Help from another person toileting, which includes using toliet, bedpan, or urinal?: A Lot Help from another person bathing (including washing, rinsing, drying)?: A Lot Help from another person to put on and taking off regular upper body clothing?: A Lot Help from another person to put on and taking off regular lower body clothing?: Total 6 Click Score: 13    End of Session Equipment Utilized During Treatment: Rolling walker (2 wheels)  OT Visit Diagnosis: Unsteadiness on feet (R26.81);Other abnormalities of gait and mobility (R26.89);Muscle weakness (generalized) (M62.81)   Activity Tolerance Patient tolerated treatment well   Patient Left in bed;with call bell/phone within reach    Nurse Communication Mobility status        Time: 5284-1324 OT Time Calculation (min): 28 min  Charges: OT General Charges $OT Visit: 1 Visit OT Treatments $Therapeutic Activity: 23-37 mins  09/20/2022  AB, OTR/L  Acute Rehabilitation Services  Office: [phone]   Tristan Schroeder 09/20/2022, 6:23 PM

## 2022-09-20 NOTE — Plan of Care (Signed)
  Problem: Acute Rehab OT Goals (only OT should resolve) Goal: Pt. Will Perform Grooming Outcome: Progressing   Problem: Acute Rehab OT Goals (only OT should resolve) Goal: OT Additional ADL Goal #1 Outcome: Completed/Met

## 2022-09-20 NOTE — Progress Notes (Signed)
   09/20/22 1320  Vitals  Temp 97.7 F (36.5 C)  Pulse Rate 65  Resp (!) 22  BP 103/70  SpO2 98 %  O2 Device Nasal Cannula  Oxygen Therapy  O2 Flow Rate (L/min) 2 L/min  Patient Activity (if Appropriate) In bed  Pulse Oximetry Type Continuous  Post Treatment  Dialyzer Clearance Clear  Hemodialysis Intake (mL) 0 mL  Liters Processed 84  Fluid Removed (mL) 2000 mL  Tolerated HD Treatment Yes  Post-Hemodialysis Comments Pt tolerated Procedure without difficulties. VSS, admin medication per order. Report given to 61M bedside Gaynelle Adu.   Received patient in bed to unit.  Alert and oriented.  Informed consent signed and in chart.   TX duration:3.30  Patient tolerated well.  Transported back to the room  Alert, without acute distress.  Hand-off given to patient's nurse.   Access used: Yes Access issues: No  Total UF removed: 2000 Medication(s) given: See MAR Post HD VS: See Above Grid Post HD weight: 69.4 kg   [facility]

## 2022-09-20 NOTE — Assessment & Plan Note (Signed)
Creatinine 6.29 this morning. TTS schedule. Received hemodialysis 9/9 without complications. Will receive HD today. Hemodialysis initiated in 2013. - Continue midodrine 10 mg 3 times daily - RFP - Consulted nephrology, appreciate recommendations - Continuing to avoid nephrotoxic drugs - Sucroferric oxyhydroxide 1000mg  BID w meals

## 2022-09-20 NOTE — Assessment & Plan Note (Signed)
1-2 weeks of subjective reduced movement and inability to walk. Reports this had been the case since he fell and injured his L knee.  Left-sided weakness noted throughout his chart, unsure how acute this reduced movement is -PT/OT following, appreciate recommendations  -Considering head imaging if neuro exam changes -Pt declines palliative consult at this time

## 2022-09-20 NOTE — Assessment & Plan Note (Signed)
Pt requires a renal-diet, but has signs of muscle wasting and inadequate food intake. - Nutrition consulted

## 2022-09-20 NOTE — Assessment & Plan Note (Addendum)
-  Pt with intermittent Mobitz type I block (Wenckebach) overnight -Cardiology consulted initially, did not recommend formal follow up at this time -Continue home 2L O2 via [place] as needed

## 2022-09-20 NOTE — TOC Progression Note (Signed)
Transition of Care [facility]) - Progression Note    Patient Details  Name: Nathaniel White MRN: [id number] Date of Birth: 12-16-1960  Transition of Care [facility]) CM/SW Contact  Erin Sons, [place] Phone Number: 09/20/2022, 4:08 PM  Clinical Narrative:     CSW provided SNF bed offers to pt. He expresses interest in [facility] but would like to discuss with family first. CSW provided contact number for choice.   Expected Discharge Plan: Skilled Nursing Facility Barriers to Discharge: Continued Medical Work up, English as a second language teacher, SNF Pending bed offer  Expected Discharge Plan and Services     Post Acute Care Choice: Skilled Nursing Facility Living arrangements for the past 2 months: Single Family Home                                       Social Determinants of Health (SDOH) Interventions SDOH Screenings   Food Insecurity: No Food Insecurity (09/16/2022)  Housing: Low Risk  (09/16/2022)  Transportation Needs: No Transportation Needs (09/16/2022)  Utilities: Not At Risk (09/16/2022)  Alcohol Screen: Low Risk  (07/18/2022)  Depression (PHQ2-9): Low Risk  (07/18/2022)  Financial Resource Strain: Low Risk  (07/18/2022)  Physical Activity: Inactive (07/18/2022)  Social Connections: Moderately Isolated (07/18/2022)  Stress: No Stress Concern Present (07/18/2022)  Tobacco Use: Medium Risk (09/16/2022)    Readmission Risk Interventions    09/15/2021   10:09 AM 09/06/2021    4:20 PM 06/23/2021    3:52 PM  Readmission Risk Prevention Plan  Transportation Screening Complete Complete Complete  PCP or Specialist Appt within 3-5 Days Complete Complete Complete  HRI or Home Care Consult Complete Complete Complete  Social Work Consult for Recovery Care Planning/Counseling Complete Complete Complete  Palliative Care Screening Not Applicable Not Applicable Not Applicable  Medication Review Oceanographer) Complete Complete Complete

## 2022-09-20 NOTE — Progress Notes (Signed)
Daily Progress Note Intern Pager: [phone]  Patient name: Nathaniel White Medical record number: [id number] Date of birth: 1960/04/06 Age: 62 y.o. Gender: male  Primary Care Provider: Claiborne Rigg, NP Consultants: urology, nephrology Code Status: Full  Pt Overview and Major Events to Date:  9/7: Admitted 9/8: Surgical Debridement / partial penectomy  Assessment and Plan:  62 year old male with past medical history asthma, ESRD on dialysis with history of failed transplant, COPD, T2DM, esophageal varices, stroke w/ residual hemiparesis, HTN, HFrEF, paroxysmal A-fib on elliquis, VT, anemia of chronic disease who presented with weakness and purulent penile necrosis.  Patient has been fully transitioned to p.o. antibiotics.  Daily dressing changes by nursing.  No complaints at this time.  Awaiting SNF placement, appreciate TOC assistance with this. Assessment & Plan Sepsis secondary to penile infection Remains afebrile. HIV testing non-reactive. Syphilis testing positive. G/C negative. Considering differentials of penile infection/calciphylaxis vs pulmonary infection as cause of systemic infection. - Urology and nephrology following, appreciate recommendations - Discontinued doxycycline and metronidazole - transitioned from IV ceftriaxone to oral Augmentin for 2 weeks post debridement per ID pharmacy - Penicillin G started 9/10 for 3 doses to treat + Tpall - Continuing to monitor CBC, RFP daily - Bcx NG 4 days. Wound cultures growing E. coli resistant only to ampicillin - Vitals per floor protocol  - Added incentive spirometry  Puncture wound of left knee with complication Abrasion over the left knee. Clinically improving. No gout history, maintaining broad differential with possibility of infectious/ inflammatory /traumatic causes. Contracture on left side secondary to prior CVA but worsened with knee injury. MR w/o contrast L knee nondiagnostic but no soft tissue swelling or  fluid collections that were seen. Small knee joint effusion. - Continue abx as above  - stable  Weakness generalized 1-2 weeks of subjective reduced movement and inability to walk. Reports this had been the case since he fell and injured his L knee.  Left-sided weakness noted throughout his chart, unsure how acute this reduced movement is -PT/OT following, appreciate recommendations  -Considering head imaging if neuro exam changes -Pt declines palliative consult at this time ESRD (end stage renal disease) (HCC) Creatinine 6.29 this morning. TTS schedule. Received hemodialysis 9/9 without complications. Will receive HD today. Hemodialysis initiated in 2013. - Continue midodrine 10 mg 3 times daily - RFP - Consulted nephrology, appreciate recommendations - Continuing to avoid nephrotoxic drugs - Sucroferric oxyhydroxide 1000mg  BID w meals HFrEF (heart failure with reduced ejection fraction) (HCC) CXR with atelectasis v. Infection.  Chronically on 2 L of oxygen at baseline. - Avoid fluid administration unless necessary First degree heart block -Pt with intermittent Mobitz type I block (Wenckebach) overnight -Cardiology consulted initially, did not recommend formal follow up at this time -Continue home 2L O2 via [place] as needed Moderate protein-calorie malnutrition (HCC) Pt requires a renal-diet, but has signs of muscle wasting and inadequate food intake. - Nutrition consulted  Chronic and Stable Problems:  HFrEF: Chronically on 2L O2 via [place]. Avoid fluids unless necessary Paroxysmal A-fib: Apixaban 5 mg BID COPD: Albuterol 2.5 mg Q6h PRN. Mometasone-formoterol 200-5, inhalation BID. T2DM: Diet controlled. Hypoglycemic episodes on admission resolved, avoiding insulin.  FEN/GI: renal diet  PPx: eliquis Dispo:SNF Barriers include placement.   Subjective:  No acute events overnight.  Patient stressed because he lost his phone when he was transported to his new room.  I did look for it for  him, and I notified the charge nurse.  Otherwise he  is having no pain.  I discussed SNF with him again.  He agrees that he will need help when he gets discharged with ADLs and is amenable to SNF.  Objective: Temp:  [97.6 F (36.4 C)-98 F (36.7 C)] 97.8 F (36.6 C) (09/12 0905) Pulse Rate:  [61-74] 74 (09/12 1000) Resp:  [16-26] 26 (09/12 1035) BP: (93-141)/(56-83) 119/83 (09/12 1035) SpO2:  [95 %-100 %] 100 % (09/12 1000) Weight:  [71.6 kg] 71.6 kg (09/12 0906) Physical Exam: General: Resting comfortably, no acute distress Cardiovascular: Regular rate and rhythm, no murmurs Respiratory: Slight rhonchi distant with previous exam.  No increased work of breathing Abdomen: Soft, nontender Extremities: Consistent with my last exam, left leg bent, no leg swelling bilaterally  Laboratory: Most recent CBC Lab Results  Component Value Date   WBC 9.8 09/20/2022   HGB 13.0 09/20/2022   HCT 40.7 09/20/2022   MCV 86.8 09/20/2022   PLT 122 (L) 09/20/2022   Most recent BMP    Latest Ref Rng & Units 09/20/2022    4:19 AM  BMP  Glucose 70 - 99 mg/dL 84   BUN 6 - 20 mg/dL 42   Creatinine 1.61 - 1.24 mg/dL 0.96   Sodium 045 - 409 mmol/L 136   Potassium 3.5 - 5.1 mmol/L 4.2   Chloride 98 - 111 mmol/L 94   CO2 22 - 32 mmol/L 22   Calcium 8.9 - 10.3 mg/dL 9.7    Imaging/Diagnostic Tests: None  Jaley Yan, DO 09/20/2022, 10:45 AM  PGY-1, [facility] FPTS Intern pager: [phone], text pages welcome Secure chat group [facility] Teaching Service

## 2022-09-20 NOTE — Procedures (Signed)
I was present at this dialysis session. I have reviewed the session itself and made appropriate changes.   Seen on HD.  Using [facility].  3k bath, K 4.2 this AM.  UF goal 2L. Hb stable.    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  STarted on therapy.  Stop Na THiosulfate.  Filed Weights   09/18/22 1408 09/19/22 0622 09/20/22 0906  Weight: 72.3 kg 72.2 kg (S) 71.6 kg    Recent Labs  Lab 09/20/22 0419  NA 136  K 4.2  CL 94*  CO2 22  GLUCOSE 84  BUN 42*  CREATININE 6.29*  CALCIUM 9.7  PHOS 3.7    Recent Labs  Lab 09/15/22 0846 09/15/22 0904 09/18/22 1144 09/19/22 0714 09/20/22 0419  WBC 11.4*   < > 11.8* 9.8 9.8  NEUTROABS 9.7*  --  10.1*  --   --   HGB 11.7*   < > 10.0* 11.2* 13.0  HCT 38.9*   < > 33.3* 35.3* 40.7  MCV 88.8   < > 89.8 87.6 86.8  PLT 190   < > 165 162 122*   < > = values in this interval not displayed.    Scheduled Meds:  amiodarone  200 mg Oral Daily   amoxicillin-clavulanate  1 tablet Oral Once per day on Tuesday Thursday Saturday   apixaban  5 mg Oral BID   Chlorhexidine Gluconate Cloth  6 each Topical Q0600   dextrose  12.5 g Intravenous Once   influenza vac split trivalent PF  0.5 mL Intramuscular Tomorrow-1000   midodrine  10 mg Oral TID WC   mometasone-formoterol  2 puff Inhalation BID   penicillin g benzathine (BICILLIN-LA) IM  2.4 Million Units Intramuscular Weekly   sucroferric oxyhydroxide  1,000 mg Oral BID PC   Continuous Infusions:  sodium thiosulfate 25 g in sodium chloride 0.9 % 200 mL Infusion for Calciphylaxis Stopped (09/18/22 1543)   PRN Meds:.acetaminophen, albuterol, benzonatate, hydrOXYzine   Sabra Heck  MD 09/20/2022, 9:53 AM

## 2022-09-20 NOTE — Assessment & Plan Note (Signed)
Remains afebrile. HIV testing non-reactive. Syphilis testing positive. G/C negative. Considering differentials of penile infection/ calciphylaxis vs pulmonary infection as cause of systemic infection. - Urology and nephrology following, appreciate recommendations - Discontinued doxycycline and metronidazole - transitioned from IV ceftriaxone to oral Augmentin for 2 weeks post debridement per ID pharmacy - Penicillin G started 9/10 for 3 doses to treat + Tpall - Continuing to monitor CBC, RFP daily - Bcx NG 4 days. Wound cultures growing E. coli resistant only to ampicillin - Vitals per floor protocol  - Added incentive spirometry

## 2022-09-20 NOTE — Assessment & Plan Note (Signed)
Abrasion over the left knee. Clinically improving. No gout history, maintaining broad differential with possibility of infectious/ inflammatory /traumatic causes. Contracture on left side secondary to prior CVA but worsened with knee injury. MR w/o contrast L knee nondiagnostic but no soft tissue swelling or fluid collections that were seen. Small knee joint effusion. - Continue abx as above  - stable

## 2022-09-21 DIAGNOSIS — N186 End stage renal disease: Secondary | ICD-10-CM | POA: Diagnosis not present

## 2022-09-21 DIAGNOSIS — S3120XA Unspecified open wound of penis, initial encounter: Secondary | ICD-10-CM | POA: Diagnosis not present

## 2022-09-21 LAB — RENAL FUNCTION PANEL
Albumin: 2.2 g/dL — ABNORMAL LOW (ref 3.5–5.0)
Anion gap: 29 — ABNORMAL HIGH (ref 5–15)
BUN: 41 mg/dL — ABNORMAL HIGH (ref 6–20)
CO2: 20 mmol/L — ABNORMAL LOW (ref 22–32)
Calcium: 9.7 mg/dL (ref 8.9–10.3)
Chloride: 91 mmol/L — ABNORMAL LOW (ref 98–111)
Creatinine, Ser: 5.95 mg/dL — ABNORMAL HIGH (ref 0.61–1.24)
GFR, Estimated: 10 mL/min — ABNORMAL LOW (ref >60)
Glucose, Bld: 85 mg/dL (ref 70–99)
Phosphorus: 4 mg/dL (ref 2.5–4.6)
Potassium: 4 mmol/L (ref 3.5–5.1)
Sodium: 140 mmol/L (ref 135–145)

## 2022-09-21 LAB — AEROBIC/ANAEROBIC CULTURE W GRAM STAIN (SURGICAL/DEEP WOUND)

## 2022-09-21 MED ORDER — PROSOURCE PLUS PO LIQD
30.0000 mL | Freq: Two times a day (BID) | ORAL | Status: DC
  Administered 2022-09-21 – 2022-09-22 (×2): 30 mL via ORAL
  Filled 2022-09-21 (×4): qty 30

## 2022-09-21 NOTE — Assessment & Plan Note (Addendum)
Creatinine 6.29 yesterday, labs for today pending. TTS schedule. Received hemodialysis 9/9 without complications. Hemodialysis initiated in 2013. - Continue midodrine 10 mg 3 times daily - RFP - Nephrology following, appreciate recommendations - Continuing to avoid nephrotoxic drugs - Sucroferric oxyhydroxide 1000mg  BID w meals

## 2022-09-21 NOTE — Assessment & Plan Note (Signed)
Abrasion over the left knee. Clinically improving. No gout history, maintaining broad differential with possibility of infectious/ inflammatory /traumatic causes. Contracture on left side secondary to prior CVA but worsened with knee injury. MR w/o contrast L knee nondiagnostic but no soft tissue swelling or fluid collections that were seen. Small knee joint effusion. - Continue abx as above  - stable

## 2022-09-21 NOTE — Progress Notes (Signed)
Advised by CSW that pt may d/c to snf tomorrow after HD if insurance auth for snf received. Contacted inpt HD unit to request 1st shift HD tomorrow if possible in order for pt to d/c to snf in a timely manner if snf auth received. Update provided to renal PA as well. Will assist as needed.   Olivia Canter Renal Navigator [phone]

## 2022-09-21 NOTE — Progress Notes (Signed)
[facility] Progress Note   Subjective:  Seen in room - no CP/dyspnea.  Got 1 dose of IM penicillin, but looks like now will be on 2 week course of PO Augmentin. Off Na thiosulfate. Urology following, unclear if will need further debridement.  Objective Vitals:   09/20/22 1320 09/20/22 1635 09/20/22 2117 09/21/22 0816  BP: 103/70 102/64 120/64 (!) 116/48  Pulse: 65 74 71 72  Resp: (!) 22 18  17   Temp: 97.7 F (36.5 C) 97.6 F (36.4 C) 98 F (36.7 C) 98.1 F (36.7 C)  TempSrc:  Oral    SpO2: 98% 99% 93% 100%  Weight:      Height:       Physical Exam General: Chronically ill appearing man, NAD. Room air Heart: RRR; no murmur Lungs: CTAB; no rales Abdomen: soft GU: Penile wound not examined Extremities: no LE edema Dialysis Access: TDC in L chest  Additional Objective Labs: Basic Metabolic Panel: Recent Labs  Lab 09/18/22 1144 09/19/22 0714 09/20/22 0419  NA 131*  133* 136 136  K 3.7  3.7 3.7 4.2  CL 96*  98 96* 94*  CO2 23  24 22 22   GLUCOSE 111*  112* 80 84  BUN 32*  31* 27* 42*  CREATININE 5.04*  4.99* 4.50* 6.29*  CALCIUM 9.3  9.4 9.5 9.7  PHOS 4.2 2.8 3.7   Liver Function Tests: Recent Labs  Lab 09/15/22 0846 09/16/22 0217 09/17/22 0140 09/17/22 0340 09/18/22 1144 09/19/22 0714 09/20/22 0419  AST 40  --  32  --   --   --   --   ALT 21  --  18  --   --   --   --   ALKPHOS 142*  --  110  --   --   --   --   BILITOT 1.8*  --  1.7*  --   --   --   --   PROT 7.4  --  7.0  --   --   --   --   ALBUMIN 1.8*   < > 2.1*   < > 2.2* 2.1* 2.1*   < > = values in this interval not displayed.   CBC: Recent Labs  Lab 09/15/22 0846 09/15/22 0904 09/17/22 0140 09/18/22 1144 09/19/22 0714 09/20/22 0419 09/20/22 1429  WBC 11.4*   < > 9.3 11.8* 9.8 9.8 10.8*  NEUTROABS 9.7*  --   --  10.1*  --   --   --   HGB 11.7*   < > 10.6* 10.0* 11.2* 13.0 11.7*  HCT 38.9*   < > 35.4* 33.3* 35.3* 40.7 37.2*  MCV 88.8   < > 89.2 89.8 87.6 86.8  86.3  PLT 190   < > 164 165 162 122* 164   < > = values in this interval not displayed.   Blood Culture    Component Value Date/Time   SDES FLUID 09/16/2022 1617   SPECREQUEST NONE 09/16/2022 1617   CULT  09/16/2022 1617    FEW ESCHERICHIA COLI FEW BACTEROIDES OVATUS MODERATE BACTEROIDES FRAGILIS RARE STREPTOCOCCUS ANGINOSIS SUSCEPTIBILITIES TO FOLLOW BETA LACTAMASE POSITIVE Performed at [facility], [address] [place], [place] [address]    REPTSTATUS PENDING 09/16/2022 1617   Medications:   amiodarone  200 mg Oral Daily   amoxicillin-clavulanate  1 tablet Oral QHS   apixaban  5 mg Oral BID   Chlorhexidine Gluconate Cloth  6 each Topical Q0600  dextrose  12.5 g Intravenous Once   midodrine  10 mg Oral TID WC   mometasone-formoterol  2 puff Inhalation BID   penicillin g benzathine (BICILLIN-LA) IM  2.4 Million Units Intramuscular Weekly   sucroferric oxyhydroxide  1,000 mg Oral BID PC    Dialysis Orders: TTS SW 4:15hr, 450/600, EDW 67.5kg, 2K/2Ca bath, TDC, no heparin - last OP HD 9/05, post wt 70.3kg - coming off 3-5kg over for last 3 wks - parsabiv 7.5mg  IV three times per week - no esa or vdra   Home meds include - albuterol, amiodarone, eliquis, advair diskus, midodrine 5mg  1- 2 on non-hd days, singulair, velphoro 1 gm ac tid, prns    Assessment/Plan:   Penile infection/sepsis: Urology consulted, underwent debridement with biopsy on 9/10. 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 Blood tests confirm. IM PCN given once, ?for 2 more weekly injections. Will also be on 2 week course of PO Augmentin. Health department was contacted by primary team. Rest of STD screen negative. ESRD: Continue HD on usual TTS schedule - next tomorrow. No heparin. HypoTN/ volume: No edema on exam - BP stable, was using midodrine pre-HD at home -> changed to 10mg  TID here for BP support. Anemia of ESRD: Hgb > 11, no  ESA needs. Secondary HPTH: CorrCa high, Phos fine. On Velphoro as binder. Resume Parsabiv as outpatient (not formulary here).  Hx CVA: On Eliquis Nutrition: Alb very low, adding supplements   Nathaniel Hoyle, PA-C 09/21/2022, 9:56 AM  [employer]

## 2022-09-21 NOTE — Progress Notes (Signed)
Physical Therapy Treatment Patient Details Name: Nathaniel White MRN: [id number] DOB: 1960/04/11 Today's Date: 09/21/2022   History of Present Illness Pt is 62 year old presented to [facility] on  09/15/22 for sepsis due to penile infection.Urology with bedside debridement on 9/7 and taking to OR for debridement on 9/8. 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 Pt also with painful L knee wound from fall several weeks ago. PMH - stroke (residual L side weakness), ESRD (HD TTS), HTN, PVD, COPD, CHF, asthma, anxiety.    PT Comments  Pt received in supine, sleeping but easily awoken and agreeable to therapy session, with good participation and tolerance for seated balance and BLE exercises and squat/scoot transfer training. Pt needing +2 maxA to totalA for squat pivot transfer from bed to drop arm chair on his R side and up to modA with multimodal cues to perform bed mobility with use of hospital bed features/rail. Pt reports dizziness while seated although his BP was stable, consider Vestibular PT assessment next session if issue persists. PTA reviewed with RN equipment needed for pressure relief and safe return transfer from chair (Prevalon boot and lift pad for hoyer). Pt continues to benefit from PT services to progress toward functional mobility goals.     If plan is discharge home, recommend the following: A lot of help with bathing/dressing/bathroom;Assistance with cooking/housework;Assist for transportation;Two people to help with walking and/or transfers;Supervision due to cognitive status   Can travel by private vehicle     No  Equipment Recommendations  Hoyer lift;Other (comment) (wheelchair with cushion if he does not already have one)    Recommendations for Other Services       Precautions / Restrictions Precautions Precautions: Fall Precaution Comments: 2L baseline, residual L hemi Restrictions Weight Bearing Restrictions: No Other Position/Activity Restrictions: RN asked to  order him LLE Prevalon boot 9/13     Mobility  Bed Mobility Overal bed mobility: Needs Assistance Bed Mobility: Rolling, Sidelying to Sit Rolling: Mod assist, Used rails Sidelying to sit: Mod assist, Used rails       General bed mobility comments: Cues for log roll technique to R EOB; Pt able to advance L shoulder with cues to assist with rolling, needs mod to maxA for LLE movement due to weakness/fatigue and pain. Pt able to use RUE to push away from bed rail when sitting up to raise trunk, needs up to modA for stability/lift assist.    Transfers Overall transfer level: Needs assistance Equipment used: 2 person hand held assist Transfers: Bed to chair/wheelchair/BSC       Squat pivot transfers: Max assist, Total assist, +2 physical assistance, From elevated surface     General transfer comment: Defer STS as pt with difficulty lifting hips off bed with squatting trials with +1 assist. RN called to room and assisting with +2 lift assist for squat pivot toward drop arm chair on his R side, pt needs maxA for first two scoots and totalA +2 as he fatigues. Difficulty with weight shifting anterior over his feet despite cues. RN notified to order clip sling lift pad at end of session for ease of return to bed from chair (none seen in supply room).    Ambulation/Gait               General Gait Details: Non-ambulatory at baseline   Stairs             Wheelchair Mobility     Tilt Bed    Modified Rankin (Stroke Patients Only)  Balance Overall balance assessment: Needs assistance Sitting-balance support: No upper extremity supported, Single extremity supported, Feet supported Sitting balance-Leahy Scale: Fair Sitting balance - Comments: using RUE support primarily; some CGA to minA needed when unsupported. Pt c/o dizziness but BP stable seated                                    Cognition Arousal: Alert Behavior During Therapy: WFL for tasks  assessed/performed Overall Cognitive Status: Within Functional Limits for tasks assessed                                 General Comments: Very poor historian. Disoriented to time, situation, aware he is at [facility]. Difficulty sequencing functional tasks.        Exercises General Exercises - Lower Extremity Ankle Circles/Pumps: AROM, Both, 5 reps, Supine, AAROM Heel Slides: AROM, AAROM, Left, 10 reps, Supine Hip ABduction/ADduction: AAROM, Both, 10 reps, Supine Other Exercises Other Exercises: seated LLE knee flex/extension x10 reps seated EOB with washcloth under foot to reduce friction    General Comments General comments (skin integrity, edema, etc.): BP 103/58 (71) taken supine HR 74 bpm; BP 109/60 (74) seated EOB, c/o dizziness. Possible mild nystagmus in L eye with vertical gazes? HR 83 bpm seated. L knee remains flexed in supine, RN asked to obtain him a L Prevalon boot for more optimal positioning in bed/chair. SpO2 WFL on 3L O2       Pertinent Vitals/Pain Pain Assessment Pain Assessment: Faces Faces Pain Scale: Hurts even more Pain Location: L knee with A/AAROM Pain Descriptors / Indicators: Discomfort, Grimacing, Guarding, Aching Pain Intervention(s): Limited activity within patient's tolerance, Monitored during session, Repositioned, Other (comment) (RN notified he would benefit from LLE Prevalon boot for better positioning and pressure relief.)     PT Goals (current goals can now be found in the care plan section) Acute Rehab PT Goals Patient Stated Goal: Less pain so I can move better before I go home PT Goal Formulation: With patient Time For Goal Achievement: 09/30/22 Progress towards PT goals: Progressing toward goals (slowly)    Frequency    Min 1X/week      PT Plan         AM-PAC PT "6 Clicks" Mobility   Outcome Measure  Help needed turning from your back to your side while in a flat bed without using bedrails?: A Lot Help needed  moving from lying on your back to sitting on the side of a flat bed without using bedrails?: Total (without rails) Help needed moving to and from a bed to a chair (including a wheelchair)?: Total Help needed standing up from a chair using your arms (e.g., wheelchair or bedside chair)?: Total Help needed to walk in hospital room?: Total Help needed climbing 3-5 steps with a railing? : Total 6 Click Score: 7    End of Session Equipment Utilized During Treatment: Gait belt;Oxygen Activity Tolerance: Patient tolerated treatment well;Patient limited by fatigue Patient left: in chair;with call bell/phone within reach;with chair alarm set;Other (comment) (awaiting secretary/RN to order lift pad) Nurse Communication: Mobility status;Other (comment) (needs L Prevalon boot, lift pad) PT Visit Diagnosis: Other abnormalities of gait and mobility (R26.89);Muscle weakness (generalized) (M62.81);History of falling (Z91.81);Hemiplegia and hemiparesis;Pain Hemiplegia - Right/Left: Left Hemiplegia - dominant/non-dominant: Dominant Pain - Right/Left: Left Pain - part of body: Knee  Time: 5784-6962 PT Time Calculation (min) (ACUTE ONLY): 29 min  Charges:    $Therapeutic Exercise: 8-22 mins $Therapeutic Activity: 8-22 mins PT General Charges $$ ACUTE PT VISIT: 1 Visit                     Bemnet Trovato P., PTA Acute Rehabilitation Services Secure Chat Preferred 9a-5:30pm Office: [phone]    [facility] 09/21/2022, 2:11 PM

## 2022-09-21 NOTE — TOC Progression Note (Addendum)
Transition of Care [facility]) - Progression Note    Patient Details  Name: Nathaniel White MRN: [id number] Date of Birth: 1960/03/10  Transition of Care [facility] CM/SW Contact  Erin Sons, [place] Phone Number: 09/21/2022, 10:35 AM  Clinical Narrative:     CSW met with pt and confirmed choice of [facility]. [facility] does not transport to HD so pt would need to have his on transportation arranged. Pt states his friend would likely transport him but will call him today to confirm. CSW also submitted [facility] application as an alternative transport option.   Auth started and is currently pending; [id number]  1200: CSW met with pt who confirmed his friend would be able to transport him to HD while he is at [facility].   1555: Auth still pending at this time. [facility] confirmed they can admit pt tomorrow if Berkley Harvey is approved by then.   Expected Discharge Plan: Skilled Nursing Facility Barriers to Discharge: Insurance Authorization  Expected Discharge Plan and Services     Post Acute Care Choice: Skilled Nursing Facility Living arrangements for the past 2 months: Single Family Home                                       Social Determinants of Health (SDOH) Interventions SDOH Screenings   Food Insecurity: No Food Insecurity (09/16/2022)  Housing: Low Risk  (09/16/2022)  Transportation Needs: No Transportation Needs (09/16/2022)  Utilities: Not At Risk (09/16/2022)  Alcohol Screen: Low Risk  (07/18/2022)  Depression (PHQ2-9): Low Risk  (07/18/2022)  Financial Resource Strain: Low Risk  (07/18/2022)  Physical Activity: Inactive (07/18/2022)  Social Connections: Moderately Isolated (07/18/2022)  Stress: No Stress Concern Present (07/18/2022)  Tobacco Use: Medium Risk (09/16/2022)    Readmission Risk Interventions    09/15/2021   10:09 AM 09/06/2021    4:20 PM 06/23/2021    3:52 PM  Readmission Risk Prevention Plan  Transportation Screening Complete  Complete Complete  PCP or Specialist Appt within 3-5 Days Complete Complete Complete  HRI or Home Care Consult Complete Complete Complete  Social Work Consult for Recovery Care Planning/Counseling Complete Complete Complete  Palliative Care Screening Not Applicable Not Applicable Not Applicable  Medication Review Oceanographer) Complete Complete Complete

## 2022-09-21 NOTE — Assessment & Plan Note (Signed)
CXR with atelectasis v. Infection.  Chronically on 2 L of oxygen at baseline. - Avoid fluid administration unless necessary

## 2022-09-21 NOTE — Assessment & Plan Note (Addendum)
1-2 weeks of subjective reduced movement and inability to walk. Reports this had been the case since he fell and injured his L knee.  Left-sided weakness noted throughout his chart, unsure how acute this reduced movement is -PT/OT following, appreciate recommendations  -Pt declines palliative consult at this time

## 2022-09-21 NOTE — Assessment & Plan Note (Deleted)
Pt requires a renal-diet, but has signs of muscle wasting and inadequate food intake. - Nutrition consulted

## 2022-09-21 NOTE — Assessment & Plan Note (Signed)
-  Pt with intermittent Mobitz type I block (Wenckebach) overnight -Cardiology consulted initially, did not recommend formal follow up at this time -Continue home 2L O2 via [place] as needed

## 2022-09-21 NOTE — Progress Notes (Signed)
Daily Progress Note Intern Pager: [phone]  Patient name: Nathaniel White Medical record number: [id number] Date of birth: June 26, 1960 Age: 62 y.o. Gender: male  Primary Care Provider: Claiborne Rigg, NP Consultants: urology, nephrology Code Status: Full  Pt Overview and Major Events to Date:  9/7: Admitted 9/8: Surgical Debridement / partial penectomy  Assessment and Plan:  62 year old male with past medical history asthma, ESRD on dialysis with history of failed transplant, COPD, T2DM, esophageal varices, stroke w/ residual hemiparesis, HTN, HFrEF, paroxysmal A-fib on elliquis, VT, anemia of chronic disease who presented with weakness and purulent penile necrosis.  Pt pending SNF placement. He picked one out and TOC is in process of getting it authorized.  Assessment & Plan Sepsis secondary to penile infection Remains afebrile. HIV testing non-reactive. Syphilis testing positive. G/C negative. Considering differentials of penile infection/calciphylaxis vs pulmonary infection as cause of systemic infection. - Urology and nephrology following, appreciate recommendations - Discontinued doxycycline and metronidazole - Augmentin for 2 weeks post debridement per ID pharmacy - Penicillin G started 9/10 for 3 doses to treat + Tpall - Continuing to monitor CBC, RFP daily - Bcx NG 4 days. Wound cultures growing E. coli resistant only to ampicillin - Vitals per floor protocol  - Added incentive spirometry  Puncture wound of left knee with complication Abrasion over the left knee. Clinically improving. No gout history, maintaining broad differential with possibility of infectious/ inflammatory /traumatic causes. Contracture on left side secondary to prior CVA but worsened with knee injury. MR w/o contrast L knee nondiagnostic but no soft tissue swelling or fluid collections that were seen. Small knee joint effusion. - Continue abx as above  - stable  Weakness generalized 1-2 weeks of  subjective reduced movement and inability to walk. Reports this had been the case since he fell and injured his L knee.  Left-sided weakness noted throughout his chart, unsure how acute this reduced movement is -PT/OT following, appreciate recommendations  -Pt declines palliative consult at this time ESRD (end stage renal disease) (HCC) Creatinine 6.29 yesterday, labs for today pending. TTS schedule. Received hemodialysis 9/9 without complications. Hemodialysis initiated in 2013. - Continue midodrine 10 mg 3 times daily - RFP - Nephrology following, appreciate recommendations - Continuing to avoid nephrotoxic drugs - Sucroferric oxyhydroxide 1000mg  BID w meals HFrEF (heart failure with reduced ejection fraction) (HCC) CXR with atelectasis v. Infection.  Chronically on 2 L of oxygen at baseline. - Avoid fluid administration unless necessary First degree heart block -Pt with intermittent Mobitz type I block (Wenckebach) overnight -Cardiology consulted initially, did not recommend formal follow up at this time -Continue home 2L O2 via [place] as needed   Chronic and Stable Problems:  HFrEF: Chronically on 2L O2 via [place]. Avoid fluids unless necessary Paroxysmal A-fib: Apixaban 5 mg BID COPD: Albuterol 2.5 mg Q6h PRN. Mometasone-formoterol 200-5, inhalation BID. T2DM: Diet controlled. Hypoglycemic episodes on admission resolved, avoiding insulin.   FEN/GI: renal  PPx: eliquis Dispo:SNF pending placement  Subjective:  Doing well today.  No pain.  No acute events overnight.  Patient has picked out a SNF that he wants to go to.  Waiting on authorization.  Objective: Temp:  [97.6 F (36.4 C)-98.1 F (36.7 C)] 98.1 F (36.7 C) (09/13 0816) Pulse Rate:  [65-74] 72 (09/13 0816) Resp:  [17-24] 17 (09/13 0816) BP: (102-120)/(43-70) 116/48 (09/13 0816) SpO2:  [93 %-100 %] 100 % (09/13 0816) Weight:  [69.4 kg] 69.4 kg (09/12 1316) Physical Exam: General: Chronically ill-appearing,  no acute  distress Cardiovascular: Regular rate and rhythm, no murmurs Respiratory: Mild scattered inspiratory wheeze, no increased work of breathing Abdomen: Nondistended Extremities: No leg swelling bilaterally  Laboratory: Most recent CBC Lab Results  Component Value Date   WBC 10.8 (H) 09/20/2022   HGB 11.7 (L) 09/20/2022   HCT 37.2 (L) 09/20/2022   MCV 86.3 09/20/2022   PLT 164 09/20/2022   Most recent BMP    Latest Ref Rng & Units 09/20/2022    4:19 AM  BMP  Glucose 70 - 99 mg/dL 84   BUN 6 - 20 mg/dL 42   Creatinine 7.25 - 1.24 mg/dL 3.66   Sodium 440 - 347 mmol/L 136   Potassium 3.5 - 5.1 mmol/L 4.2   Chloride 98 - 111 mmol/L 94   CO2 22 - 32 mmol/L 22   Calcium 8.9 - 10.3 mg/dL 9.7     Tarisa Paola, DO 09/21/2022, 12:00 PM  PGY-1, [facility] FPTS Intern pager: [phone], text pages welcome Secure chat group [facility] Teaching Service

## 2022-09-21 NOTE — Assessment & Plan Note (Addendum)
Remains afebrile. HIV testing non-reactive. Syphilis testing positive. G/C negative. Considering differentials of penile infection/calciphylaxis vs pulmonary infection as cause of systemic infection. - Urology and nephrology following, appreciate recommendations - Discontinued doxycycline and metronidazole - Augmentin for 2 weeks post debridement per ID pharmacy - Penicillin G started 9/10 for 3 doses to treat + Tpall - Continuing to monitor CBC, RFP daily - Bcx NG 4 days. Wound cultures growing E. coli resistant only to ampicillin - Vitals per floor protocol  - Added incentive spirometry

## 2022-09-22 DIAGNOSIS — S3120XA Unspecified open wound of penis, initial encounter: Secondary | ICD-10-CM | POA: Diagnosis not present

## 2022-09-22 LAB — CBC
HCT: 34.3 % — ABNORMAL LOW (ref 39.0–52.0)
Hemoglobin: 10.8 g/dL — ABNORMAL LOW (ref 13.0–17.0)
MCH: 27.1 pg (ref 26.0–34.0)
MCHC: 31.5 g/dL (ref 30.0–36.0)
MCV: 86 fL (ref 80.0–100.0)
Platelets: 180 10*3/uL (ref 150–400)
RBC: 3.99 MIL/uL — ABNORMAL LOW (ref 4.22–5.81)
RDW: 19.9 % — ABNORMAL HIGH (ref 11.5–15.5)
WBC: 10.4 10*3/uL (ref 4.0–10.5)
nRBC: 0 % (ref 0.0–0.2)

## 2022-09-22 LAB — RENAL FUNCTION PANEL
Albumin: 2.1 g/dL — ABNORMAL LOW (ref 3.5–5.0)
Anion gap: 25 — ABNORMAL HIGH (ref 5–15)
BUN: 49 mg/dL — ABNORMAL HIGH (ref 6–20)
CO2: 19 mmol/L — ABNORMAL LOW (ref 22–32)
Calcium: 9.7 mg/dL (ref 8.9–10.3)
Chloride: 95 mmol/L — ABNORMAL LOW (ref 98–111)
Creatinine, Ser: 7.04 mg/dL — ABNORMAL HIGH (ref 0.61–1.24)
GFR, Estimated: 8 mL/min — ABNORMAL LOW (ref >60)
Glucose, Bld: 80 mg/dL (ref 70–99)
Phosphorus: 5.5 mg/dL — ABNORMAL HIGH (ref 2.5–4.6)
Potassium: 4.4 mmol/L (ref 3.5–5.1)
Sodium: 139 mmol/L (ref 135–145)

## 2022-09-22 MED ORDER — HEPARIN SODIUM (PORCINE) 1000 UNIT/ML IJ SOLN
4200.0000 [IU] | Freq: Once | INTRAMUSCULAR | Status: AC
  Administered 2022-09-22: 4200 [IU]
  Filled 2022-09-22: qty 4.2
  Filled 2022-09-22: qty 5

## 2022-09-22 MED ORDER — LIDOCAINE HCL (PF) 1 % IJ SOLN: 5.0000 mL | INTRAMUSCULAR | Status: DC | PRN

## 2022-09-22 MED ORDER — HEPARIN SODIUM (PORCINE) 1000 UNIT/ML DIALYSIS: 1000.0000 [IU] | INTRAMUSCULAR | Status: DC | PRN

## 2022-09-22 MED ORDER — LIDOCAINE-PRILOCAINE 2.5-2.5 % EX CREA: 1.0000 | TOPICAL_CREAM | CUTANEOUS | Status: DC | PRN

## 2022-09-22 MED ORDER — ALTEPLASE 2 MG IJ SOLR: 2.0000 mg | Freq: Once | INTRAMUSCULAR | Status: DC | PRN

## 2022-09-22 MED ORDER — PENTAFLUOROPROP-TETRAFLUOROETH EX AERO: 1.0000 | INHALATION_SPRAY | CUTANEOUS | Status: DC | PRN

## 2022-09-22 NOTE — Assessment & Plan Note (Signed)
Abrasion over the left knee. Clinically improving. No gout history, maintaining broad differential with possibility of infectious/ inflammatory /traumatic causes. Contracture on left side secondary to prior CVA but worsened with knee injury. MR w/o contrast L knee nondiagnostic but no soft tissue swelling or fluid collections that were seen. Small knee joint effusion. - Continue abx as above  - stable

## 2022-09-22 NOTE — Assessment & Plan Note (Signed)
Remains afebrile. HIV testing non-reactive. Syphilis testing positive. G/C negative. Considering differentials of penile infection/calciphylaxis vs pulmonary infection as cause of systemic infection. - Urology and nephrology following, appreciate recommendations - Discontinued doxycycline and metronidazole - Augmentin for 2 weeks post debridement per ID pharmacy - Penicillin G started 9/10 for 3 doses to treat + Tpall - Continuing to monitor CBC, RFP daily - Bcx NG 4 days. Wound cultures growing E. coli resistant only to ampicillin - Vitals per floor protocol  - Added incentive spirometry

## 2022-09-22 NOTE — Progress Notes (Signed)
Received patient in bed to unit.  Alert and oriented.  Informed consent signed and in chart.   TX duration:3.5 hours  Patient tolerated well.  Transported back to the room  Alert, without acute distress.  Hand-off given to patient's nurse.   Access used: L HD Cath Access issues: none  Total UF removed: Medication(s) given: none   09/22/22 1258  Vitals  Temp (!) 97.4 F (36.3 C)  Temp Source Oral  BP 107/64  ECG Heart Rate 67  Resp 20  MEWS COLOR  MEWS Score Color Green  Oxygen Therapy  SpO2 100 %  O2 Device Nasal Cannula  O2 Flow Rate (L/min) 3 L/min  MEWS Score  MEWS Temp 0  MEWS Systolic 0  MEWS Pulse 0  MEWS RR 0  MEWS LOC 0  MEWS Score 0     Stacie Glaze LPN [facility]

## 2022-09-22 NOTE — Discharge Summary (Shared)
Family Medicine Teaching [facility] Discharge Summary  Patient name: Nathaniel White Medical record number: [id number] Date of birth: 1960-11-19 Age: 62 y.o. Gender: male Date of Admission: 09/15/2022  Date of Discharge: *** Admitting Physician: Alfredo Martinez, MD  Primary Care Provider: Claiborne Rigg, NP Consultants: Urology, nephrology  Indication for Hospitalization: Weakness and penile necrosis  Discharge Diagnoses/Problem List:  Principal Problem:   Sepsis secondary to penile infection Active Problems:   ESRD (end stage renal disease) (HCC)   HFrEF (heart failure with reduced ejection fraction) (HCC)   Paroxysmal atrial fibrillation (HCC)   Puncture wound of left knee with complication   Open wound of penis with complication   Moderate protein-calorie malnutrition (HCC)   Weakness generalized   Diabetes mellitus without complication (HCC)   First degree heart block    Disposition: SNF  Discharge Condition: Stable  Discharge Exam:  General: 62 year old male, lying in bed, NAD Cardiovascular: RRR, normal S1/S2 Respiratory: Breathing comfortably on home O2 Abdomen: Bowel sounds present, soft, nontender palpation, nondistended Extremities: No edema of BLEs  Brief Hospital Course:  Nathaniel White is a 62 y.o.male with a history of HFrEF, pAFib, VT, mitral regurgitation, ESRD on HD, HTN, H/O CVA, restrictive lung dx, COPD, anemia of chronic dx, DM2, pulmonary hypertension who was admitted to the [facility] Teaching Service at [facility] for for weakness and penile necrosis. 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  CT pelvis showed no acute findings.  Urology was consulted and performed surgical debridement and partial penectomy.  Patient completed vanc x1 day, doxycycline,  metronidazole, cefepime.  Transitioned to p.o. Augmentin x 2 weeks postdebridement. HIV negative, patient was positive for syphilis, started on penicillin G treatment.  Blood cultures no growth x 4 days.  Wound cultures grew E. coli. Will follow up with urology outpatient in clinic.   Generalized weakness 1 to 2 weeks of reduced movement today inability to walk.  Occurred after injury to left knee. Continued to not show any new focal neurologic deficits on physical examination.  PT/OT was consulted and recommended SNF.  Left Knee Swelling  Occurred after injury, abrasion over left knee, MRI of the knee was essentially nondiagnostic due to post CVA chronic left knee and hip contractures, but showed no soft tissue swelling or fluid collections. There was small knee joint effusion.  This remained stable throughout admission.  Consider following up with orthopedics out-patient if continued problem.   Heart Block Patient has history of Mobitz type I block, intermittently had these on his telemetry.  Cardiology consulted initially and did not recommend formal follow-up.    Other chronic conditions were medically managed with home medications and formulary alternatives as necessary (ESRD, COPD)  PCP Follow-up Recommendations: Goals of care discussion with patient, multiple complex medical problems Ensure patient continues and finishes antibiotic treatment   Significant Procedures: Penile surgical debridement and partial penectomy  Significant Labs and Imaging:  Recent Labs  Lab 09/20/22 0419 09/20/22 1429 09/22/22 0440  WBC 9.8 10.8* 10.4  HGB 13.0 11.7* 10.8*  HCT 40.7 37.2* 34.3*  PLT 122* 164 180   Recent Labs  Lab 09/15/22 0846 09/15/22 0904 09/17/22 0140 09/17/22 0340 09/18/22 1144 09/19/22 0714 09/20/22 0419 09/21/22 1819  NA 134*   < > 134* 133* 131*  133* 136 136 140  K 4.7   < >  4.3 4.2 3.7  3.7 3.7 4.2 4.0  CL 93*   < > 93* 94* 96*  98 96* 94* 91*  CO2 25   < > 23 22  23  24 22 22  20*  GLUCOSE 82   < > 137* 131* 111*  112* 80 84 85  BUN 45*   < > 29* 29* 32*  31* 27* 42* 41*  CREATININE 9.40*   < > 6.33* 6.22* 5.04*  4.99* 4.50* 6.29* 5.95*  CALCIUM 9.2   < > 9.4 9.2 9.3  9.4 9.5 9.7 9.7  MG  --   --  1.9  --   --   --   --   --   PHOS  --    < >  --  5.9* 4.2 2.8 3.7 4.0  ALKPHOS 142*  --  110  --   --   --   --   --   AST 40  --  32  --   --   --   --   --   ALT 21  --  18  --   --   --   --   --   ALBUMIN 1.8*   < > 2.1* 2.2* 2.2* 2.1* 2.1* 2.2*   < > = values in this interval not displayed.      Results/Tests Pending at Time of Discharge: None  Discharge Medications:  Allergies as of 09/22/2022       Reactions   Codeine Shortness Of Breath   Dextromethorphan-guaifenesin Shortness Of Breath, Other (See Comments)   Iron Dextran Shortness Of Breath, Other (See Comments)   Morphine And Codeine Shortness Of Breath   Other Reaction(s): Other (See Comments)     Med Rec must be completed prior to using this [facility]***       Discharge Instructions: Please refer to Patient Instructions section of EMR for full details.  Patient was counseled important signs and symptoms that should prompt return to medical care, changes in medications, dietary instructions, activity restrictions, and follow up appointments.   Follow-Up Appointments:   Erick Alley, DO 09/22/2022, 5:50 AM PGY-3, [facility]

## 2022-09-22 NOTE — Assessment & Plan Note (Signed)
-  Pt with intermittent Mobitz type I block (Wenckebach) overnight -Cardiology consulted initially, did not recommend formal follow up at this time -Continue home 2L O2 via [place] as needed

## 2022-09-22 NOTE — Plan of Care (Signed)
Problem: Education: Goal: Knowledge of General Education information will improve Description: Including pain rating scale, medication(s)/side effects and non-pharmacologic comfort measures Outcome: Progressing   Problem: Health Behavior/Discharge Planning: Goal: Ability to manage health-related needs will improve Outcome: Progressing   Problem: Clinical Measurements: Goal: Ability to maintain clinical measurements within normal limits will improve Outcome: Progressing Goal: Will remain free from infection Outcome: Progressing Goal: Diagnostic test results will improve Outcome: Progressing Goal: Respiratory complications will improve Outcome: Progressing Goal: Cardiovascular complication will be avoided Outcome: Progressing   Problem: Activity: Goal: Risk for activity intolerance will decrease Outcome: Progressing   Problem: Nutrition: Goal: Adequate nutrition will be maintained Outcome: Progressing   Problem: Coping: Goal: Level of anxiety will decrease Outcome: Progressing   Problem: Elimination: Goal: Will not experience complications related to bowel motility Outcome: Progressing Goal: Will not experience complications related to urinary retention Outcome: Progressing   Problem: Pain Managment: Goal: General experience of comfort will improve Outcome: Progressing

## 2022-09-22 NOTE — Assessment & Plan Note (Signed)
1-2 weeks of subjective reduced movement and inability to walk. Reports this had been the case since he fell and injured his L knee.  Left-sided weakness noted throughout his chart, unsure how acute this reduced movement is -PT/OT following, appreciate recommendations  -Pt declines palliative consult at this time

## 2022-09-22 NOTE — Progress Notes (Signed)
Daily Progress Note Intern Pager: [phone]  Patient name: Nathaniel White Medical record number: [id number] Date of birth: Jan 11, 1960 Age: 62 y.o. Gender: male  Primary Care Provider: Claiborne Rigg, NP Consultants: Urology, nephrology Code Status: Full  Pt Overview and Major Events to Date:  9/7-admitted 9/8-surgical debridement/partial penectomy   Patient is medically stable and awaiting SNF placement   Assessment and Plan: 62 year old male with past medical history asthma, ESRD on dialysis with history of failed transplant, COPD, T2DM, esophageal varices, stroke w/ residual hemiparesis, HTN, HFrEF, paroxysmal A-fib on elliquis, VT, anemia of chronic disease who presented with weakness and purulent penile necrosis.  Assessment & Plan Sepsis secondary to penile infection Remains afebrile. HIV testing non-reactive. Syphilis testing positive. G/C negative. Considering differentials of penile infection/calciphylaxis vs pulmonary infection as cause of systemic infection. - Urology and nephrology following, appreciate recommendations - Discontinued doxycycline and metronidazole - Augmentin for 2 weeks post debridement per ID pharmacy - Penicillin G started 9/10 for 3 doses to treat + Tpall - Continuing to monitor CBC, RFP daily - Bcx NG 4 days. Wound cultures growing E. coli resistant only to ampicillin - Vitals per floor protocol  - Added incentive spirometry  Puncture wound of left knee with complication Abrasion over the left knee. Clinically improving. No gout history, maintaining broad differential with possibility of infectious/ inflammatory /traumatic causes. Contracture on left side secondary to prior CVA but worsened with knee injury. MR w/o contrast L knee nondiagnostic but no soft tissue swelling or fluid collections that were seen. Small knee joint effusion. - Continue abx as above  - stable  Weakness generalized 1-2 weeks of subjective reduced movement and  inability to walk. Reports this had been the case since he fell and injured his L knee.  Left-sided weakness noted throughout his chart, unsure how acute this reduced movement is -PT/OT following, appreciate recommendations  -Pt declines palliative consult at this time ESRD (end stage renal disease) (HCC) HD TTS schedule. Hemodialysis initiated in 2013. - Continue midodrine 10 mg 3 times daily - RFP - Nephrology following, appreciate recommendations - Continuing to avoid nephrotoxic drugs - Sucroferric oxyhydroxide 1000mg  BID w meals HFrEF (heart failure with reduced ejection fraction) (HCC) CXR with atelectasis v. Infection.  Chronically on 2 L of oxygen at baseline. - Avoid fluid administration unless necessary First degree heart block -Pt with intermittent Mobitz type I block (Wenckebach) overnight -Cardiology consulted initially, did not recommend formal follow up at this time -Continue home 2L O2 via [place] as needed   Chronic and Stable Issues: HFrEF: Chronically on 2L O2 via [place]. Avoid fluids unless necessary Paroxysmal A-fib: Apixaban 5 mg BID COPD: Albuterol 2.5 mg Q6h PRN. Mometasone-formoterol 200-5, inhalation BID. T2DM: Diet controlled. Hypoglycemic episodes on admission resolved, avoiding insulin  FEN/GI: Renal diet PPx: Eliquis Dispo: Pending SNF placement  Subjective:  Patient states he is sleepy but no concerns or complaints  Objective: Temp:  [97.4 F (36.3 C)-98.2 F (36.8 C)] 98.2 F (36.8 C) (09/14 0519) Pulse Rate:  [59-73] 60 (09/14 0519) Resp:  [16-18] 16 (09/14 0519) BP: (106-131)/(48-85) 112/72 (09/14 0519) SpO2:  [99 %-100 %] 99 % (09/14 0519) Physical Exam: General: 62 year old male, lying in bed, NAD Cardiovascular: RRR, normal S1/S2 Respiratory: Breathing comfortably on home O2 Abdomen: Bowel sounds present, soft, nontender palpation, nondistended Extremities: No edema of BLEs  Laboratory: Most recent CBC Lab Results  Component Value Date    WBC 10.4 09/22/2022   HGB 10.8 (L) 09/22/2022  HCT 34.3 (L) 09/22/2022   MCV 86.0 09/22/2022   PLT 180 09/22/2022   Most recent BMP    Latest Ref Rng & Units 09/21/2022    6:19 PM  BMP  Glucose 70 - 99 mg/dL 85   BUN 6 - 20 mg/dL 41   Creatinine 1.61 - 1.24 mg/dL 0.96   Sodium 045 - 409 mmol/L 140   Potassium 3.5 - 5.1 mmol/L 4.0   Chloride 98 - 111 mmol/L 91   CO2 22 - 32 mmol/L 20   Calcium 8.9 - 10.3 mg/dL 9.7      Erick Alley, DO 09/22/2022, 5:45 AM  PGY-3, [facility] FPTS Intern pager: [phone], text pages welcome Secure chat group [facility] Teaching Service

## 2022-09-22 NOTE — Procedures (Signed)
I was present at this dialysis session. I have reviewed the session itself and made appropriate changes. 2L UF. 3K bath using TDC.  Awaitin disposition.   Filed Weights   09/20/22 0906 09/20/22 1316 09/22/22 0854  Weight: (S) 71.6 kg (S) 69.4 kg 71.9 kg    Recent Labs  Lab 09/22/22 0440  NA 139  K 4.4  CL 95*  CO2 19*  GLUCOSE 80  BUN 49*  CREATININE 7.04*  CALCIUM 9.7  PHOS 5.5*    Recent Labs  Lab 09/18/22 1144 09/19/22 0714 09/20/22 0419 09/20/22 1429 09/22/22 0440  WBC 11.8*   < > 9.8 10.8* 10.4  NEUTROABS 10.1*  --   --   --   --   HGB 10.0*   < > 13.0 11.7* 10.8*  HCT 33.3*   < > 40.7 37.2* 34.3*  MCV 89.8   < > 86.8 86.3 86.0  PLT 165   < > 122* 164 180   < > = values in this interval not displayed.    Scheduled Meds:  (feeding supplement) PROSource Plus  30 mL Oral BID BM   amiodarone  200 mg Oral Daily   amoxicillin-clavulanate  1 tablet Oral QHS   apixaban  5 mg Oral BID   Chlorhexidine Gluconate Cloth  6 each Topical Q0600   dextrose  12.5 g Intravenous Once   heparin sodium (porcine)  4,200 Units Intracatheter Once   midodrine  10 mg Oral TID WC   mometasone-formoterol  2 puff Inhalation BID   penicillin g benzathine (BICILLIN-LA) IM  2.4 Million Units Intramuscular Weekly   sucroferric oxyhydroxide  1,000 mg Oral BID PC   Continuous Infusions: PRN Meds:.acetaminophen, albuterol, alteplase, benzonatate, heparin, hydrOXYzine, lidocaine (PF), lidocaine-prilocaine, pentafluoroprop-tetrafluoroeth   Sabra Heck  MD 09/22/2022, 10:24 AM

## 2022-09-22 NOTE — TOC Progression Note (Signed)
Transition of Care [facility]) - Progression Note    Patient Details  Name: Nathaniel White MRN: [id number] Date of Birth: 1960-07-10  Transition [facility]) CM/SW Contact  Dellie Burns [place], [place] Phone Number: 09/22/2022, 12:02 PM  Clinical Narrative: [facility] auth for SNF remains pending, currently under med review. Will provide updates as available.   Dellie Burns, MSW, LCSW [phone] (coverage)      Expected Discharge Plan: Skilled Nursing Facility Barriers to Discharge: Insurance Authorization  Expected Discharge Plan and Services     Post Acute Care Choice: Skilled Nursing Facility Living arrangements for the past 2 months: Single Family Home                                       Social Determinants of Health (SDOH) Interventions SDOH Screenings   Food Insecurity: No Food Insecurity (09/16/2022)  Housing: Low Risk  (09/16/2022)  Transportation Needs: No Transportation Needs (09/16/2022)  Utilities: Not At Risk (09/16/2022)  Alcohol Screen: Low Risk  (07/18/2022)  Depression (PHQ2-9): Low Risk  (07/18/2022)  Financial Resource Strain: Low Risk  (07/18/2022)  Physical Activity: Inactive (07/18/2022)  Social Connections: Moderately Isolated (07/18/2022)  Stress: No Stress Concern Present (07/18/2022)  Tobacco Use: Medium Risk (09/16/2022)    Readmission Risk Interventions    09/15/2021   10:09 AM 09/06/2021    4:20 PM 06/23/2021    3:52 PM  Readmission Risk Prevention Plan  Transportation Screening Complete Complete Complete  PCP or Specialist Appt within 3-5 Days Complete Complete Complete  HRI or Home Care Consult Complete Complete Complete  Social Work Consult for Recovery Care Planning/Counseling Complete Complete Complete  Palliative Care Screening Not Applicable Not Applicable Not Applicable  Medication Review [employer]) Complete Complete Complete

## 2022-09-22 NOTE — Assessment & Plan Note (Signed)
HD TTS schedule. Hemodialysis initiated in 2013. - Continue midodrine 10 mg 3 times daily - RFP - Nephrology following, appreciate recommendations - Continuing to avoid nephrotoxic drugs - Sucroferric oxyhydroxide 1000mg  BID w meals

## 2022-09-22 NOTE — Assessment & Plan Note (Signed)
CXR with atelectasis v. Infection.  Chronically on 2 L of oxygen at baseline. - Avoid fluid administration unless necessary

## 2022-09-23 DIAGNOSIS — S3120XA Unspecified open wound of penis, initial encounter: Secondary | ICD-10-CM | POA: Diagnosis not present

## 2022-09-23 LAB — RENAL FUNCTION PANEL
Albumin: 2.2 g/dL — ABNORMAL LOW (ref 3.5–5.0)
Anion gap: 14 (ref 5–15)
BUN: 34 mg/dL — ABNORMAL HIGH (ref 6–20)
CO2: 25 mmol/L (ref 22–32)
Calcium: 9.2 mg/dL (ref 8.9–10.3)
Chloride: 96 mmol/L — ABNORMAL LOW (ref 98–111)
Creatinine, Ser: 5.09 mg/dL — ABNORMAL HIGH (ref 0.61–1.24)
GFR, Estimated: 12 mL/min — ABNORMAL LOW (ref >60)
Glucose, Bld: 103 mg/dL — ABNORMAL HIGH (ref 70–99)
Phosphorus: 4.3 mg/dL (ref 2.5–4.6)
Potassium: 3.8 mmol/L (ref 3.5–5.1)
Sodium: 135 mmol/L (ref 135–145)

## 2022-09-23 LAB — CBC
HCT: 34.5 % — ABNORMAL LOW (ref 39.0–52.0)
Hemoglobin: 10.5 g/dL — ABNORMAL LOW (ref 13.0–17.0)
MCH: 26.9 pg (ref 26.0–34.0)
MCHC: 30.4 g/dL (ref 30.0–36.0)
MCV: 88.5 fL (ref 80.0–100.0)
Platelets: 198 10*3/uL (ref 150–400)
RBC: 3.9 MIL/uL — ABNORMAL LOW (ref 4.22–5.81)
RDW: 20.2 % — ABNORMAL HIGH (ref 11.5–15.5)
WBC: 9 10*3/uL (ref 4.0–10.5)
nRBC: 0 % (ref 0.0–0.2)

## 2022-09-23 NOTE — Progress Notes (Signed)
[facility] Progress Note   Subjective:  Seen in room - no new complaints. HD went fine yesterday - 2L off.  Objective Vitals:   09/22/22 1724 09/22/22 1956 09/23/22 0447 09/23/22 0900  BP: 90/60 109/77 (!) 103/55 108/60  Pulse: 68 62 73 78  Resp:  18 18   Temp:  98.2 F (36.8 C) 98.8 F (37.1 C) 98.1 F (36.7 C)  TempSrc:    Oral  SpO2:  91% 96% 99%  Weight:      Height:       Physical Exam General: Chronically ill appearing man, NAD. Room air Heart: RRR; no murmur Lungs: CTAB; no rales Abdomen: soft GU: Penile wound not examined Extremities: no LE edema Dialysis Access: TDC in L chest  Additional Objective Labs: Basic Metabolic Panel: Recent Labs  Lab 09/20/22 0419 09/21/22 1819 09/22/22 0440  NA 136 140 139  K 4.2 4.0 4.4  CL 94* 91* 95*  CO2 22 20* 19*  GLUCOSE 84 85 80  BUN 42* 41* 49*  CREATININE 6.29* 5.95* 7.04*  CALCIUM 9.7 9.7 9.7  PHOS 3.7 4.0 5.5*   Liver Function Tests: Recent Labs  Lab 09/17/22 0140 09/17/22 0340 09/20/22 0419 09/21/22 1819 09/22/22 0440  AST 32  --   --   --   --   ALT 18  --   --   --   --   ALKPHOS 110  --   --   --   --   BILITOT 1.7*  --   --   --   --   PROT 7.0  --   --   --   --   ALBUMIN 2.1*   < > 2.1* 2.2* 2.1*   < > = values in this interval not displayed.   CBC: Recent Labs  Lab 09/18/22 1144 09/19/22 0714 09/20/22 0419 09/20/22 1429 09/22/22 0440 09/23/22 0839  WBC 11.8* 9.8 9.8 10.8* 10.4 9.0  NEUTROABS 10.1*  --   --   --   --   --   HGB 10.0* 11.2* 13.0 11.7* 10.8* 10.5*  HCT 33.3* 35.3* 40.7 37.2* 34.3* 34.5*  MCV 89.8 87.6 86.8 86.3 86.0 88.5  PLT 165 162 122* 164 180 198   Blood Culture    Component Value Date/Time   SDES FLUID 09/16/2022 1617   SPECREQUEST NONE 09/16/2022 1617   CULT  09/16/2022 1617    FEW ESCHERICHIA COLI FEW BACTEROIDES OVATUS MODERATE BACTEROIDES FRAGILIS RARE STREPTOCOCCUS ANGINOSIS BETA LACTAMASE POSITIVE Performed at [facility]  Lab, [address], [place], [place] [address]    REPTSTATUS 09/21/2022 FINAL 09/16/2022 1617   Medications:   (feeding supplement) PROSource Plus  30 mL Oral BID BM   amiodarone  200 mg Oral Daily   amoxicillin-clavulanate  1 tablet Oral QHS   apixaban  5 mg Oral BID   Chlorhexidine Gluconate Cloth  6 each Topical Q0600   dextrose  12.5 g Intravenous Once   midodrine  10 mg Oral TID WC   mometasone-formoterol  2 puff Inhalation BID   penicillin g benzathine (BICILLIN-LA) IM  2.4 Million Units Intramuscular Weekly   sucroferric oxyhydroxide  1,000 mg Oral BID PC    Dialysis Orders: TTS SW 4:15hr, 450/600, EDW 67.5kg, 2K/2Ca bath, TDC, no heparin - last OP HD 9/05, post wt 70.3kg - coming off 3-5kg over for last 3 wks - parsabiv 7.5mg  IV three times per week - no esa or vdra   Home meds  include - albuterol, amiodarone, eliquis, advair diskus, midodrine 5mg  1- 2 on non-hd days, singulair, velphoro 1 gm ac tid, prns    Assessment/Plan:   Penile infection/sepsis: Urology consulted, underwent debridement with biopsy on 9/10. 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 Blood tests confirm. IM PCN weekly x 3 for T. pallidium. Will also be on 2 week course of PO Augmentin as wound Cx grew E.coli. Health department was contacted by primary team. Rest of STD screen negative. ESRD: Continue HD on usual TTS schedule - next 9/17. HypoTN/ volume: No edema on exam - BP stable, was using midodrine pre-HD at home -> changed to 10mg  TID here for BP support. Anemia of ESRD: Hgb > 11, no ESA needs. Secondary HPTH: CorrCa high, Phos fine. On Velphoro as binder. Resume Parsabiv as outpatient (not formulary here).  Hx CVA: On Eliquis Nutrition: Alb very low, continue supplements  Ozzie Hoyle, PA-C 09/23/2022, 9:49 AM  [employer]

## 2022-09-23 NOTE — Assessment & Plan Note (Addendum)
Remains afebrile. HIV testing non-reactive. Syphilis testing positive. G/C negative. Patient will follow-up with urology in the outpatient setting. - Urology and nephrology following, appreciate recommendations - Discontinued doxycycline and metronidazole - Augmentin for 2 weeks post debridement per ID pharmacy - IM Penicillin G weekly started 9/10 for 3 doses to treat + Tpall - Continuing to monitor CBC, RFP daily - Bcx NG 4 days. Wound cultures growing E. coli resistant only to ampicillin

## 2022-09-23 NOTE — Assessment & Plan Note (Deleted)
1-2 weeks of subjective reduced movement and inability to walk. Reports this had been the case since he fell and injured his L knee.  Left-sided weakness noted throughout his chart, unsure how acute this reduced movement is -PT/OT following, appreciate recommendations  -Pt declines palliative consult at this time

## 2022-09-23 NOTE — Progress Notes (Signed)
Daily Progress Note Intern Pager: [phone]  Patient name: Nathaniel White Medical record number: [id number] Date of birth: 05/01/1960 Age: 62 y.o. Gender: male  Primary Care Provider: Claiborne Rigg, NP Consultants: Urology, nephrology Code Status: Full  Pt Overview and Major Events to Date:  9/7-admitted 9/8-surgical debridement/partial penectomy 9/10-started IM penicillin G weekly  Assessment and Plan: Nathaniel White is a 62 y.o. male who presented with weakness and purulent penile necrosis now receiving antibiotic therapy but medically stable for discharge to SNF awaiting placement. Pertinent PMH/PSH includes asthma, ESRD on dialysis with history of failed transplant, COPD, T2DM, esophageal varices, stroke w/ residual hemiparesis, HTN, HFrEF, paroxysmal A-fib on elliquis, VT, anemia of chronic disease .  Assessment & Plan Sepsis secondary to penile infection Remains afebrile. HIV testing non-reactive. Syphilis testing positive. G/C negative. Patient will follow-up with urology in the outpatient setting. - Urology and nephrology following, appreciate recommendations - Discontinued doxycycline and metronidazole - Augmentin for 2 weeks post debridement per ID pharmacy - IM Penicillin G weekly started 9/10 for 3 doses to treat + Tpall - Continuing to monitor CBC, RFP daily - Bcx NG 4 days. Wound cultures growing E. coli resistant only to ampicillin Puncture wound of left knee with complication Abrasion over the left knee. Clinically improving. No gout history, maintaining broad differential with possibility of infectious/ inflammatory /traumatic causes. Contracture on left side secondary to prior CVA but worsened with knee injury. MR w/o contrast L knee nondiagnostic but no soft tissue swelling or fluid collections that were seen. Small knee joint effusion. - Continue abx as above  - stable   Chronic and Stable Problems:  HFrEF: Chronically on 2L O2 via [place]. Avoid fluids unless  necessary Paroxysmal A-fib: Apixaban 5 mg BID COPD: Albuterol 2.5 mg Q6h PRN. Mometasone-formoterol 200-5, inhalation BID. T2DM: Diet controlled. Hypoglycemic episodes on admission resolved, avoiding insulin ESRD on HD: TTS schedule, Velphoro 1000 mg twice daily, midodrine 10 mg 3 times daily  FEN/GI: Renal diet PPx: Eliquis Dispo:SNF  pending placement .  Subjective:  Patient notes he is doing well this morning and awaiting progression to SNF.  He is hopeful for Monday.  Objective: Temp:  [97.4 F (36.3 C)-98.8 F (37.1 C)] 98.8 F (37.1 C) (09/15 0447) Pulse Rate:  [61-106] 73 (09/15 0447) Resp:  [18-20] 18 (09/15 0447) BP: (90-120)/(50-100) 103/55 (09/15 0447) SpO2:  [91 %-100 %] 96 % (09/15 0447) Weight:  [69.9 kg-71.9 kg] 69.9 kg (09/14 1312) Physical Exam: General: Well-appearing, NAD Cardiovascular: RRR, no murmurs auscultated Respiratory: CTAB, normal WOB Abdomen: Soft, normoactive bowel sounds  Laboratory: Most recent CBC Lab Results  Component Value Date   WBC 10.4 09/22/2022   HGB 10.8 (L) 09/22/2022   HCT 34.3 (L) 09/22/2022   MCV 86.0 09/22/2022   PLT 180 09/22/2022   Most recent BMP    Latest Ref Rng & Units 09/22/2022    4:40 AM  BMP  Glucose 70 - 99 mg/dL 80   BUN 6 - 20 mg/dL 49   Creatinine 1.47 - 1.24 mg/dL 8.29   Sodium 562 - 130 mmol/L 139   Potassium 3.5 - 5.1 mmol/L 4.4   Chloride 98 - 111 mmol/L 95   CO2 22 - 32 mmol/L 19   Calcium 8.9 - 10.3 mg/dL 9.7    Imaging/Diagnostic Tests: No recent imaging results.  Shelby Mattocks, DO 09/23/2022, 6:47 AM  PGY-3, [facility] FPTS Intern pager: [phone], text pages welcome Secure chat group [facility]  [facility] Teaching Service

## 2022-09-23 NOTE — Assessment & Plan Note (Signed)
Abrasion over the left knee. Clinically improving. No gout history, maintaining broad differential with possibility of infectious/ inflammatory /traumatic causes. Contracture on left side secondary to prior CVA but worsened with knee injury. MR w/o contrast L knee nondiagnostic but no soft tissue swelling or fluid collections that were seen. Small knee joint effusion. - Continue abx as above  - stable

## 2022-09-23 NOTE — Progress Notes (Signed)
Upon turning pt it was noted that nose bleeding, no clots noted. When pt sat up he was able to suction the blood out of his mouth. Pt brushed teeth and no more bleeding noted. Provider notified

## 2022-09-24 DIAGNOSIS — E440 Moderate protein-calorie malnutrition: Secondary | ICD-10-CM

## 2022-09-24 DIAGNOSIS — N186 End stage renal disease: Secondary | ICD-10-CM | POA: Diagnosis not present

## 2022-09-24 DIAGNOSIS — S3120XA Unspecified open wound of penis, initial encounter: Secondary | ICD-10-CM | POA: Diagnosis not present

## 2022-09-24 LAB — RENAL FUNCTION PANEL
Albumin: 2.1 g/dL — ABNORMAL LOW (ref 3.5–5.0)
Anion gap: 15 (ref 5–15)
BUN: 53 mg/dL — ABNORMAL HIGH (ref 6–20)
CO2: 19 mmol/L — ABNORMAL LOW (ref 22–32)
Calcium: 9 mg/dL (ref 8.9–10.3)
Chloride: 101 mmol/L (ref 98–111)
Creatinine, Ser: 7.25 mg/dL — ABNORMAL HIGH (ref 0.61–1.24)
GFR, Estimated: 8 mL/min — ABNORMAL LOW (ref >60)
Glucose, Bld: 100 mg/dL — ABNORMAL HIGH (ref 70–99)
Phosphorus: 6.2 mg/dL — ABNORMAL HIGH (ref 2.5–4.6)
Potassium: 5.1 mmol/L (ref 3.5–5.1)
Sodium: 135 mmol/L (ref 135–145)

## 2022-09-24 LAB — CBC
HCT: 39.9 % (ref 39.0–52.0)
Hemoglobin: 12.6 g/dL — ABNORMAL LOW (ref 13.0–17.0)
MCH: 28.6 pg (ref 26.0–34.0)
MCHC: 31.6 g/dL (ref 30.0–36.0)
MCV: 90.7 fL (ref 80.0–100.0)
Platelets: 158 10*3/uL (ref 150–400)
RBC: 4.4 MIL/uL (ref 4.22–5.81)
RDW: 20.3 % — ABNORMAL HIGH (ref 11.5–15.5)
WBC: 7.4 10*3/uL (ref 4.0–10.5)
nRBC: 0 % (ref 0.0–0.2)

## 2022-09-24 MED ORDER — PENICILLIN G BENZATHINE 1200000 UNIT/2ML IM SUSY: 2.4000 10*6.[IU] | PREFILLED_SYRINGE | INTRAMUSCULAR | Status: AC

## 2022-09-24 MED ORDER — AMIODARONE HCL 200 MG PO TABS: 200.0000 mg | ORAL_TABLET | Freq: Every day | ORAL | 0 refills | Status: DC

## 2022-09-24 MED ORDER — MIDODRINE HCL 10 MG PO TABS: 10.0000 mg | ORAL_TABLET | Freq: Three times a day (TID) | ORAL | Status: DC

## 2022-09-24 MED ORDER — AMOXICILLIN-POT CLAVULANATE 500-125 MG PO TABS: 1.0000 | ORAL_TABLET | Freq: Every day | ORAL | Status: AC

## 2022-09-24 NOTE — Progress Notes (Signed)
Pt to d/c to snf today. Contacted [facility] to advise clinic of pt's d/c to snf today and that pt should resume care tomorrow. Clinic advised of snf name and that pt advised TOC staff that friend can assist with transporting pt to HD appts while at snf.   Olivia Canter Renal Navigator [phone]

## 2022-09-24 NOTE — Assessment & Plan Note (Signed)
Remains afebrile. HIV testing non-reactive. Syphilis testing positive. G/C negative. Patient will follow-up with urology in the outpatient setting. S/p Doxycycline and Metronidazole. - Urology and nephrology following, appreciate recommendations - Augmentin for 2 weeks post debridement per ID pharmacy - IM Penicillin G weekly started 9/10 for 3 doses to treat +Tpall - Continuing to monitor CBC, RFP daily - Bcx NG 5 days. Wound cultures growing E. Coli resistant only to ampicillin and Strep Anginosis.

## 2022-09-24 NOTE — Assessment & Plan Note (Addendum)
Abrasion over the left knee. Clinically improving. No gout history, maintaining broad differential with possibility of infectious/ inflammatory /traumatic causes. Contracture on left side secondary to prior CVA but worsened with knee injury. MR w/o contrast L knee nondiagnostic but no soft tissue swelling or fluid collections that were seen. Small knee joint effusion. - Continue abx as above  - Stable

## 2022-09-24 NOTE — TOC Transition Note (Signed)
Transition of Care [facility]) - CM/SW Discharge Note   Patient Details  Name: Nathaniel White MRN: [id number] Date of Birth: May 11, 1960  Transition of Care [facility]) CM/SW Contact:  Lorri Frederick, LCSW Phone Number: 09/24/2022, 2:02 PM   Clinical Narrative:   Pt discharging to [facility], room 126B.  RN call report to [phone].  CSW spoke with pt and he confirmed that he has several friends who will transport him to HD while at [facility].    Final next level of care: Skilled Nursing Facility Barriers to Discharge: Insurance Authorization   Patient Goals and CMS Choice CMS Medicare.gov Compare Post Acute Care list provided to:: Patient Choice offered to / list presented to : Patient  Discharge Placement                Patient chooses bed at: [facility] Patient to be transferred to facility by: ptar Name of family member notified: mother [name], sister [name]-both by phone Patient and family notified of of transfer: 09/24/22  Discharge Plan and Services Additional resources added to the After Visit Summary for       Post Acute Care Choice: Skilled Nursing Facility                               Social Determinants of Health (SDOH) Interventions SDOH Screenings   Food Insecurity: No Food Insecurity (09/16/2022)  Housing: Low Risk  (09/16/2022)  Transportation Needs: No Transportation Needs (09/16/2022)  Utilities: Not At Risk (09/16/2022)  Alcohol Screen: Low Risk  (07/18/2022)  Depression (PHQ2-9): Low Risk  (07/18/2022)  Financial Resource Strain: Low Risk  (07/18/2022)  Physical Activity: Inactive (07/18/2022)  Social Connections: Moderately Isolated (07/18/2022)  Stress: No Stress Concern Present (07/18/2022)  Tobacco Use: Medium Risk (09/16/2022)     Readmission Risk Interventions    09/15/2021   10:09 AM 09/06/2021    4:20 PM 06/23/2021    3:52 PM  Readmission Risk Prevention Plan  Transportation Screening Complete Complete Complete  PCP or  Specialist Appt within 3-5 Days Complete Complete Complete  HRI or Home Care Consult Complete Complete Complete  Social Work Consult for Recovery Care Planning/Counseling Complete Complete Complete  Palliative Care Screening Not Applicable Not Applicable Not Applicable  Medication Review Oceanographer) Complete Complete Complete

## 2022-09-24 NOTE — Progress Notes (Signed)
Daily Progress Note Intern Pager: [phone]  Patient name: Nathaniel White Medical record number: [id number] Date of birth: 10/26/1960 Age: 62 y.o. Gender: male  Primary Care Provider: Claiborne Rigg, NP Consultants: Urology, Nephrology Code Status: Full Code  Pt Overview and Major Events to Date:  9/7: Admitted 9/8: Surgical debridement/partial penectomy 9/10: IM penicillin G weekly started x3 doses  9/12: Augmentin x2 weeks (9/12-9/26)  Assessment and Plan: Nathaniel White is a 62 y.o. male who presented with weakness and purulent penile necrosis now receiving antibiotic therapy but medically stable for discharge to SNF awaiting placement. Pertinent PMH/PSH includes asthma, ESRD on dialysis with history of failed transplant, COPD, T2DM, esophageal varices, stroke w/ residual hemiparesis, HTN, HFrEF, paroxysmal A-fib on elliquis, VT, anemia of chronic disease .  Assessment & Plan Sepsis secondary to penile infection Remains afebrile. HIV testing non-reactive. Syphilis testing positive. G/C negative. Patient will follow-up with urology in the outpatient setting. S/p Doxycycline and Metronidazole. - Urology and nephrology following, appreciate recommendations - Augmentin for 2 weeks post debridement per ID pharmacy - IM Penicillin G weekly started 9/10 for 3 doses to treat +Tpall - Continuing to monitor CBC, RFP daily - Bcx NG 5 days. Wound cultures growing E. Coli resistant only to ampicillin and Strep Anginosis. Puncture wound of left knee with complication Abrasion over the left knee. Clinically improving. No gout history, maintaining broad differential with possibility of infectious/ inflammatory /traumatic causes. Contracture on left side secondary to prior CVA but worsened with knee injury. MR w/o contrast L knee nondiagnostic but no soft tissue swelling or fluid collections that were seen. Small knee joint effusion. - Continue abx as above  - Stable    Chronic and Stable  Problems:  HFrEF: Chronically on 2L O2 via [place]. Avoid fluids unless necessary Paroxysmal A-fib: Apixaban 5 mg BID COPD: Albuterol 2.5 mg Q6h PRN. Mometasone-formoterol 200-5, inhalation BID. T2DM: Diet controlled. Hypoglycemic episodes on admission resolved, avoiding insulin ESRD on HD: TTS schedule, Velphoro 1000 mg twice daily, midodrine 10 mg 3 times daily   FEN/GI: Renal diet PPx: Eliquis Dispo: SNF pending placement .   Subjective:  Patient is doing well this morning and has no concerns. Awaiting progression to SNF.   Objective: Temp:  [98 F (36.7 C)-98.6 F (37 C)] 98.6 F (37 C) (09/16 0832) Pulse Rate:  [51-60] 56 (09/16 0947) Resp:  [18] 18 (09/16 0947) BP: (99-119)/(49-69) 119/69 (09/16 1248) SpO2:  [89 %-100 %] 99 % (09/16 0947) Physical Exam: General: NAD, awake and alert HEENT: Normocephalic, atraumatic. Conjunctiva normal. No nasal discharge Cardiovascular: RRR. No M/R/G Respiratory: CTAB, normal WOB on RA. No wheezing, crackles, rhonchi, or diminished breath sounds. Abdomen: Soft, non-tender, non-distended. Bowel sounds normoactive Extremities: No BLE edema, no deformities or significant joint findings. Skin: Warm and dry.  Laboratory: Most recent CBC Lab Results  Component Value Date   WBC 7.4 09/24/2022   HGB 12.6 (L) 09/24/2022   HCT 39.9 09/24/2022   MCV 90.7 09/24/2022   PLT 158 09/24/2022   Most recent BMP    Latest Ref Rng & Units 09/24/2022    8:19 AM  BMP  Glucose 70 - 99 mg/dL 130   BUN 6 - 20 mg/dL 53   Creatinine 8.65 - 1.24 mg/dL 7.84   Sodium 696 - 295 mmol/L 135   Potassium 3.5 - 5.1 mmol/L 5.1   Chloride 98 - 111 mmol/L 101   CO2 22 - 32 mmol/L 19   Calcium 8.9 -  10.3 mg/dL 9.0    Imaging/Diagnostic Tests: No new imaging.  Fortunato Curling, DO 09/24/2022, 4:41 PM PGY-1, [facility]  FPTS Intern pager: [phone], text pages welcome Secure chat group [facility] Teaching Service

## 2022-09-24 NOTE — Progress Notes (Addendum)
   8 Days Post-Op Subjective: NAEON. Accompanied by roommate. Pt reports no pain, but states he was unaware his glans had largely been removed. Reviewed his case and surgical course.   Objective: Vital signs in last 24 hours: Temp:  [98 F (36.7 C)-98.6 F (37 C)] 98.6 F (37 C) (09/16 0832) Pulse Rate:  [51-60] 56 (09/16 0947) Resp:  [18] 18 (09/16 0947) BP: (99-111)/(49-60) 111/60 (09/16 0947) SpO2:  [89 %-100 %] 99 % (09/16 0947)  Assessment/Plan:  This is a 62 y.o. male with a history of asthma, ESRD on dialysis (history of prior failed transplant), COPD, diabetes, esophageal varices, stroke with residual hemiparesis, HTN, HFrEF, pAfib on eliquis who presented with weakness x2 weeks. He had significant penile necrosis and purulence on arrival. Now s/p OR debridement on 09/16/22.   #penile necrosis/purulence - Nursing to change dressing daily. No dressing present on rounds. Wound appears to be healing well.  - Wound cultures growing E coli resistant only to ampicillin - confirmatory syphilis test (FT-Abs) positive. Penicillin G ordered per primary  - PO augmentin x2 weeks from debridement per discussion with pharmacy - Path: Ulceration with acute inflammation, granulation tissue and vascular  changes consistent with endarteritis obliterans.  Immunohistochemistry for Treponema pallidum is positive  -CT A/P shows diffuse calcium deposition throughout the corporal bodies. Still concerning for calciphylaxis. Discussed with Nephrology.  - No foley indicated - No further wound debridement at this time - Outpatient follow up in urology clinic  Intake/Output from previous day: 09/15 0701 - 09/16 0700 In: 360 [P.O.:360] Out: 0   Intake/Output this shift: No intake/output data recorded.  Physical Exam:  General: somnolent CV: No cyanosis Lungs: equal chest rise Abdomen: Soft, NTND, no rebound or guarding Skin: poorly vascularized lower extremities with left knee wound Gu:  minimal remaining glans tissue. Surgical site edges appear healthy with bleeding edges, no evidence of purulence.   Lab Results: Recent Labs    09/22/22 0440 09/23/22 0839 09/24/22 0819  HGB 10.8* 10.5* 12.6*  HCT 34.3* 34.5* 39.9   BMET Recent Labs    09/23/22 0839 09/24/22 0819  NA 135 135  K 3.8 5.1  CL 96* 101  CO2 25 19*  GLUCOSE 103* 100*  BUN 34* 53*  CREATININE 5.09* 7.25*  CALCIUM 9.2 9.0     Studies/Results: No results found.    LOS: 9 days   Elmon Kirschner, NP [facility]   .  09/24/2022, 11:39 AM

## 2022-09-24 NOTE — Progress Notes (Signed)
Physical Therapy Treatment Patient Details Name: Nathaniel White MRN: [id number] DOB: 1960/05/05 Today's Date: 09/24/2022   History of Present Illness Pt is 62 year old presented to [facility] on  09/15/22 for sepsis due to penile infection.Urology with bedside debridement on 9/7 and taking to OR for debridement on 9/8. 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 Pt also with painful L knee wound from fall several weeks ago. PMH - stroke (residual L side weakness), ESRD (HD TTS), HTN, PVD, COPD, CHF, asthma, anxiety.    PT Comments  Pt admitted with above diagnosis. Difficult assessment of vestibular system as pt has difficulty following commands as well as pt poor historian. Pt does appear to have a bil vestibular hypofunction however has difficulty performing exercises given due to poor following of directions.  Unsure if pt will perform exercises when PT isnt assisting and pt had difficulty with PT there. Will continue PT and progress pt as able.  Pt currently with functional limitations due to the deficits listed below (see PT Problem List). Pt will benefit from acute skilled PT to increase their independence and safety with mobility to allow discharge.       If plan is discharge home, recommend the following: A lot of help with bathing/dressing/bathroom;Assistance with cooking/housework;Assist for transportation;Two people to help with walking and/or transfers;Supervision due to cognitive status   Can travel by private vehicle     No  Equipment Recommendations  Hoyer lift;Other (comment) (wheelchair with cushion if he does not already have one)    Recommendations for Other Services       Precautions / Restrictions Precautions Precautions: Fall Precaution Comments: 2L baseline, residual L hemi Restrictions Weight Bearing Restrictions: No     Mobility  Bed Mobility Overal bed mobility: Needs Assistance Bed Mobility: Rolling, Sidelying to Sit Rolling: Mod assist, Used rails          General bed mobility comments: Pt tested for BPPV and all canals negative for BPPV testing.  Does have problems with saccades as well as smooth pursuits as well as tested positive for bil vestibular hypofunction.  Initiated x 1 exercises however pt really struggles to follow directions to complete and unsure if he will comply on his own.    Transfers                        Ambulation/Gait               General Gait Details: Non-ambulatory at baseline   Stairs             Wheelchair Mobility     Tilt Bed    Modified Rankin (Stroke Patients Only)       Balance                                            Cognition Arousal: Alert Behavior During Therapy: WFL for tasks assessed/performed Overall Cognitive Status: Within Functional Limits for tasks assessed                                 General Comments: Very poor historian. Disoriented to time, situation, aware he is at [facility]. Difficulty sequencing functional tasks.        Exercises General Exercises - Lower Extremity Heel Slides: AROM, AAROM, Left, 10 reps, Supine  Straight Leg Raises Limitations: No AROM from LLE Other Exercises Other Exercises: x 1 exercises initiated with pt    General Comments        Pertinent Vitals/Pain Pain Assessment Pain Assessment: Faces Faces Pain Scale: Hurts even more Pain Location: L knee with A/AAROM Pain Descriptors / Indicators: Discomfort, Grimacing, Guarding, Aching Pain Intervention(s): Limited activity within patient's tolerance, Monitored during session, Repositioned    Home Living                          Prior Function            PT Goals (current goals can now be found in the care plan section) Progress towards PT goals: Not progressing toward goals - comment (Pt with difficulty following commands for activity and limited by pain)    Frequency    Min 1X/week      PT Plan       Co-evaluation              AM-PAC PT "6 Clicks" Mobility   Outcome Measure  Help needed turning from your back to your side while in a flat bed without using bedrails?: A Lot Help needed moving from lying on your back to sitting on the side of a flat bed without using bedrails?: Total (without rails) Help needed moving to and from a bed to a chair (including a wheelchair)?: Total Help needed standing up from a chair using your arms (e.g., wheelchair or bedside chair)?: Total Help needed to walk in hospital room?: Total Help needed climbing 3-5 steps with a railing? : Total 6 Click Score: 7    End of Session Equipment Utilized During Treatment: Gait belt;Oxygen Activity Tolerance: Patient limited by fatigue Patient left: with call bell/phone within reach;in bed;with bed alarm set Nurse Communication: Mobility status;Need for lift equipment PT Visit Diagnosis: Other abnormalities of gait and mobility (R26.89);Muscle weakness (generalized) (M62.81);History of falling (Z91.81);Hemiplegia and hemiparesis;Pain Hemiplegia - Right/Left: Left Hemiplegia - dominant/non-dominant: Dominant Pain - Right/Left: Left Pain - part of body: Knee     Time: 1236-1250 PT Time Calculation (min) (ACUTE ONLY): 14 min  Charges:    $Therapeutic Exercise: 8-22 mins PT General Charges $$ ACUTE PT VISIT: 1 Visit                     Artesha Wemhoff M,PT Acute Rehab Services [phone]    Bevelyn Buckles 09/24/2022, 2:38 PM

## 2022-09-24 NOTE — Plan of Care (Signed)
  Problem: Education: Goal: Knowledge of disease and its progression will improve Outcome: Progressing Goal: Individualized Educational Video(s) Outcome: Not Applicable

## 2022-09-24 NOTE — Progress Notes (Signed)
Report given to [place], LPN at [facility].

## 2022-09-24 NOTE — Progress Notes (Signed)
[facility] Progress Note   Subjective:  Seen in room - in good spirits this am. Says he is going to SNF today   Objective Vitals:   09/23/22 2055 09/24/22 0655 09/24/22 0829 09/24/22 0832  BP: (!) 99/57 (!) 107/56  (!) 99/49  Pulse: 60 (!) 55  (!) 51  Resp:  18  18  Temp: 98.2 F (36.8 C) 98 F (36.7 C)  98.6 F (37 C)  TempSrc: Oral Oral    SpO2: 98% 100% 98% (!) 89%  Weight:      Height:       Physical Exam General: Chronically ill appearing man, NAD. Room air Heart: RRR; no murmur Lungs: CTAB; no rales Abdomen: soft GU: Penile wound not examined Extremities: no LE edema Dialysis Access: TDC in L chest  Additional Objective Labs: Basic Metabolic Panel: Recent Labs  Lab 09/21/22 1819 09/22/22 0440 09/23/22 0839  NA 140 139 135  K 4.0 4.4 3.8  CL 91* 95* 96*  CO2 20* 19* 25  GLUCOSE 85 80 103*  BUN 41* 49* 34*  CREATININE 5.95* 7.04* 5.09*  CALCIUM 9.7 9.7 9.2  PHOS 4.0 5.5* 4.3   Liver Function Tests: Recent Labs  Lab 09/21/22 1819 09/22/22 0440 09/23/22 0839  ALBUMIN 2.2* 2.1* 2.2*   CBC: Recent Labs  Lab 09/18/22 1144 09/19/22 0714 09/20/22 0419 09/20/22 1429 09/22/22 0440 09/23/22 0839 09/24/22 0819  WBC 11.8*   < > 9.8 10.8* 10.4 9.0 7.4  NEUTROABS 10.1*  --   --   --   --   --   --   HGB 10.0*   < > 13.0 11.7* 10.8* 10.5* 12.6*  HCT 33.3*   < > 40.7 37.2* 34.3* 34.5* 39.9  MCV 89.8   < > 86.8 86.3 86.0 88.5 90.7  PLT 165   < > 122* 164 180 198 158   < > = values in this interval not displayed.   Blood Culture    Component Value Date/Time   SDES FLUID 09/16/2022 1617   SPECREQUEST NONE 09/16/2022 1617   CULT  09/16/2022 1617    FEW ESCHERICHIA COLI FEW BACTEROIDES OVATUS MODERATE BACTEROIDES FRAGILIS RARE STREPTOCOCCUS ANGINOSIS BETA LACTAMASE POSITIVE Performed at [facility], [address] [place], [place] [address]    REPTSTATUS 09/21/2022 FINAL 09/16/2022 1617   Medications:   (feeding  supplement) PROSource Plus  30 mL Oral BID BM   amiodarone  200 mg Oral Daily   amoxicillin-clavulanate  1 tablet Oral QHS   apixaban  5 mg Oral BID   Chlorhexidine Gluconate Cloth  6 each Topical Q0600   dextrose  12.5 g Intravenous Once   midodrine  10 mg Oral TID WC   mometasone-formoterol  2 puff Inhalation BID   penicillin g benzathine (BICILLIN-LA) IM  2.4 Million Units Intramuscular Weekly   sucroferric oxyhydroxide  1,000 mg Oral BID PC    Dialysis Orders: TTS SW 4:15hr, 450/600, EDW 67.5kg, 2K/2Ca bath, TDC, no heparin - last OP HD 9/05, post wt 70.3kg - coming off 3-5kg over for last 3 wks - parsabiv 7.5mg  IV three times per week - no esa or vdra   Home meds include - albuterol, amiodarone, eliquis, advair diskus, midodrine 5mg  1- 2 on non-hd days, singulair, velphoro 1 gm ac tid, prns    Assessment/Plan:   Penile infection/sepsis: Urology consulted, underwent debridement with biopsy on 9/10. 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  Blood tests confirm. IM PCN weekly x 3 for T. pallidium. Will also be on 2 week course of PO Augmentin as wound Cx grew E.coli. Health department was contacted by primary team. Rest of STI screen negative. ESRD: Continue HD on usual TTS schedule - next 9/17. Possible discharge. Will write orders in case still in hospital tomorrow HypoTN/ volume: No edema on exam - BP stable, was using midodrine pre-HD at home -> changed to 10mg  TID here for BP support. Anemia of ESRD: Hgb > 11, no ESA needs. Secondary HPTH: CorrCa high, Phos fine. On Velphoro as binder. Resume Parsabiv as outpatient (not formulary here).  Hx CVA: On Eliquis Nutrition: Alb very low, continue supplements  Nathaniel Blase PA-C [facility] 09/24/2022,9:16 AM

## 2022-09-24 NOTE — TOC Progression Note (Addendum)
Transition of Care [facility]) - Progression Note    Patient Details  Name: Nathaniel White MRN: [id number] Date of Birth: December 19, 1960  [facility]) CM/SW Contact  Lorri Frederick, LCSW Phone Number: 09/24/2022, 8:45 AM  Clinical Narrative:   SNF Auth request remains pending in [place].   1220: SNF auth approved: [id number], [id number], 9/13-9/17.    [facility] confirms they can receive pt today as long at HD transport plan still in place.   Expected Discharge Plan: Skilled Nursing Facility Barriers to Discharge: Insurance Authorization  Expected Discharge Plan and Services     Post Acute Care Choice: Skilled Nursing Facility Living arrangements for the past 2 months: Single Family Home                                       Social Determinants of Health (SDOH) Interventions SDOH Screenings   Food Insecurity: No Food Insecurity (09/16/2022)  Housing: Low Risk  (09/16/2022)  Transportation Needs: No Transportation Needs (09/16/2022)  Utilities: Not At Risk (09/16/2022)  Alcohol Screen: Low Risk  (07/18/2022)  Depression (PHQ2-9): Low Risk  (07/18/2022)  Financial Resource Strain: Low Risk  (07/18/2022)  Physical Activity: Inactive (07/18/2022)  Social Connections: Moderately Isolated (07/18/2022)  Stress: No Stress Concern Present (07/18/2022)  Tobacco Use: Medium Risk (09/16/2022)    Readmission Risk Interventions    09/15/2021   10:09 AM 09/06/2021    4:20 PM 06/23/2021    3:52 PM  Readmission Risk Prevention Plan  Transportation Screening Complete Complete Complete  PCP or Specialist Appt within 3-5 Days Complete Complete Complete  HRI or Home Care Consult Complete Complete Complete  Social Work Consult for Recovery Care Planning/Counseling Complete Complete Complete  Palliative Care Screening Not Applicable Not Applicable Not Applicable  Medication Review Oceanographer) Complete Complete Complete

## 2022-09-24 NOTE — Plan of Care (Signed)
[facility] Patient Discharge Orders- [facility] CLINIC: [facility]  Patient's name: Nathaniel White Admit/DC Dates: 09/15/2022 - 09/24/22  Discharge Diagnoses: Sepsis secondary to penile infection. Completing PO Augmentin course. F/U with urology    Aranesp: Given: --   Date and amount of last dose: --  Last Hgb: 12.6 PRBC's Given: -- Date/# of units: -- ESA dose for discharge: --- IV Iron dose at discharge: --  Heparin change: --  EDW Change: 68.5 kg    Bath Change: No change. Cont. 2 Ca bath   Access intervention/Change: -- Details:  Hectorol/Calcitriol change: --  Discharge Labs: Calcium 9.0 Phosphorus  6.2 Albumin 2.1 K+ 5.1  IV Antibiotics: No   On Coumadin: No   OTHER/APPTS/LAB ORDERS: D/C to [facility]   D/C Meds to be reconciled by nurse after every discharge.  Completed By: Tomasa Blase PA-C [facility] 09/24/2022,3:51 PM   Reviewed by: MD:______ RN_______

## 2022-10-01 ENCOUNTER — Ambulatory Visit: Payer: Medicare Other | Admitting: Nurse Practitioner

## 2022-10-02 ENCOUNTER — Ambulatory Visit: Payer: Medicare Other | Attending: Physician Assistant | Admitting: Physician Assistant

## 2022-10-02 NOTE — Progress Notes (Deleted)
Cardiology Office Note:    Date:  10/02/2022  ID:  [name], DOB [birth year], MRN [id number] PCP: Claiborne Rigg, NP  [facility] Providers Cardiologist:  Verne Carrow, MD Electrophysiologist:  Lanier Prude, MD { Click to update primary MD,subspecialty MD or APP then REFRESH:1}    {Click to Open Review  :1}   Patient Profile:     *** (HFrEF) heart failure with reduced ejection fraction  Non-ischemic cardiomyopathy  LHC 10/2013: No CAD R/L heart cath 06/21/2022: Normal coronary arteries, mean PAP 46, mean PCWP 31, CO 2.6 TTE 01/21/2019: EF 40-45, RVSP 40.8, mild MR TTE 03/13/2021: EF 35, RVSP 78.7, moderate MR TTE 09/05/2021: EF 30-35, severe LVH, RVSP 62.5, mod-severe MR, TR  TTE: EF 25-30, no RWMA, severely reduced RVSF, severe LAE, mild RAE, moderate-severe MR, RAP 3 Ventricular tachycardia  Amiodarone Not a candidate for adv Rx for CHF; only option for survival  renal/cardiac Tx but not a candidate due to non-adherence  Mitral regurgitation (mod to severe) Paroxsymal atrial fibrillation  Hx of pulmonary embolism  Pulmonary hypertension  Aortic atherosclerosis  ESRD (Tues, Thurs, Sat) S/p failed Renal Tx Chronic Obstructive Pulmonary Disease  Diabetes mellitus  Hx of CVA  Hypotension Midodrine Rx  Hyperlipidemia  Peripheral arterial disease  Hx of GI bleed  DNR (Do Not Resuscitate) Latent syphilis  Necrotic penile wound s/p partial penectomy in 09/2022      {   09/2022 ALT 18, Hg 12.6, TSH 2.641     :1}   History of Present Illness:  Discussed the use of AI scribe software for clinical note transcription with the patient, who gave verbal consent to proceed.   Nathaniel White is a 62 y.o. male who returns for follow up of (HFrEF) heart failure with reduced ejection fraction, VT, AFib. He was last seen in June 2024. He was admx 8/23-9/7 with generalized weakness in the setting of sepsis due to penile necrosis in the setting of calciphylaxis. He  required debridement and partial penectomy. He was also dx with latent syphilis tx with penicillin G. Mobitz I was noted on tele according to the notes. He is here with *** today.         ROS: ***    Studies Reviewed:       *** Risk Assessment/Calculations:   {Does this patient have ATRIAL FIBRILLATION?:[phone]} No BP recorded.  {Refresh Note OR Click here to enter BP  :1}***       Physical Exam:   VS:  There were no vitals taken for this visit.   Wt Readings from Last 3 Encounters:  09/22/22 154 lb 1.6 oz (69.9 kg)  08/25/22 165 lb (74.8 kg)  07/18/22 158 lb (71.7 kg)    Physical Exam***     Assessment and Plan:  No problem-specific Assessment & Plan notes found for this encounter. Assessment and Plan             {  VT (ventricular tachycardia) (HCC) Status post recent admission with ventricular tachycardia and cardiogenic shock in the setting of biventricular/end-stage heart failure.  VT seems to be quiescent on amiodarone therapy.  We discussed potential side effects with this medication.  He will remain on amiodarone 200 mg twice daily for a total of 7 days and then reduce to 200 mg daily.  I will see him back in 6 to 8 weeks.  I will get an EKG at that visit as well as LFTs, TSH.  I have encouraged him  to start seeing an eye doctor.   HFrEF (heart failure with reduced ejection fraction) (HCC) Nonischemic cardiomyopathy.  Cardiac catheterization in the hospital demonstrated no CAD.  End stage heart failure.  He was seen by the advanced heart failure team in the hospital.  His only chance for survival is renal and heart transplant.  He is not a candidate for this secondary to nonadherence.  He is now DNR.  He has had difficulty with hypotension in the past.  This is limited GDMT.  Volume is managed with dialysis.  His volume is improved on exam compared to the last time I saw him just prior to admission.  If his blood pressure increases over time, we can certainly consider  adding hydralazine/nitrates/beta-blocker.   Paroxysmal atrial fibrillation (HCC) He has remained in atrial fibrillation.  Rate is controlled.  He is weight is over 60 kg and his age is <80.  Continue Eliquis 5 mg twice daily.   Mitral regurgitation Moderate to severe MR.  He is not a candidate for advanced therapies/surgery.   End-stage renal disease on hemodialysis [facility]) He remains on Tuesday, Thursday, Saturday dialysis.  It sounds like he has been able to get to his dry weight.    :1}    {Are you ordering a CV Procedure (e.g. stress test, cath, DCCV, TEE, etc)?   Press F2        :[id number]}  Dispo:  No follow-ups on file.  Signed, [name], PA-C

## 2022-10-05 ENCOUNTER — Ambulatory Visit: Payer: Medicare Other | Admitting: Cardiovascular Disease

## 2022-10-15 ENCOUNTER — Telehealth: Payer: Self-pay | Admitting: Nurse Practitioner

## 2022-10-15 NOTE — Telephone Encounter (Signed)
Copied from CRM [id number]. Topic: General - Other >> Oct 15, 2022  9:15 AM Phill Myron wrote: Home Health Verbal Orders - Caller/Agency:  Ms. Samule Ohm  with [facility]  Callback Number: [phone] Requesting OT/PT/Skilled Nursing/Social Work/Speech Therapy:   nursing PT and OT  Frequency:  1w5 for Nursing

## 2022-10-17 NOTE — Telephone Encounter (Signed)
Return call to give verbal order unanswered.

## 2022-10-18 ENCOUNTER — Telehealth: Payer: Self-pay | Admitting: Nurse Practitioner

## 2022-10-18 NOTE — Telephone Encounter (Signed)
Copied from CRM [id number]. Topic: General - Inquiry >> Oct 18, 2022 10:06 AM Charletta Cousin wrote: Home Health Verbal Orders - Caller/Agency: Liji from [facility] Number: [phone] Requesting PT; Home Health PT 1x 1, 2x 2, 1x 4

## 2022-10-19 NOTE — Telephone Encounter (Signed)
Call placed to Liji from [facility]  to give VO for  Home Health PT 1x 1, 2x 2, 1x 4. Call placed to patient unable to reach message left on VM.

## 2022-10-22 ENCOUNTER — Telehealth: Payer: Self-pay

## 2022-10-22 ENCOUNTER — Telehealth: Payer: Self-pay | Admitting: Nurse Practitioner

## 2022-10-22 NOTE — Telephone Encounter (Signed)
Copied from CRM [id number]. Topic: Quick Communication - Home Health Verbal Orders >> Oct 19, 2022  4:12 PM Everette C wrote: Caller/Agency: Marcelino Duster / [facility]  Callback Number: [phone] Requesting OT/PT/Skilled Nursing/Social Work/Speech Therapy: OT Frequency: 1w5

## 2022-10-22 NOTE — Telephone Encounter (Signed)
Copied from CRM [id number]. Topic: Quick Communication - Home Health Verbal Orders >> Oct 19, 2022  4:12 PM Everette C wrote: Caller/Agency: Marcelino Duster / [facility]  Callback Number: [phone] Requesting OT/PT/Skilled Nursing/Social Work/Speech Therapy: OT Frequency: 1w5

## 2022-10-23 NOTE — Telephone Encounter (Signed)
Return call unanswered. Left on hold for more than 10 mins.

## 2022-10-29 ENCOUNTER — Ambulatory Visit: Payer: Self-pay | Admitting: *Deleted

## 2022-10-29 NOTE — Telephone Encounter (Signed)
  Chief Complaint: home health calling: low BP reading on patient Symptoms: SOB last night- offered EMS- patient refused Frequency: reading from home visit today- concerns regarding low BP and O2 sat( 90-91%)  Disposition: [] ED /[] Urgent Care (no appt availability in office) / [] Appointment(In office/virtual)/ []  [facility]/ [] Home Care/ [] Refused Recommended Disposition /[] [facility]/ [x]  Follow-up with PCP Additional Notes: Attempted to contact patient- no answer on contact number- unable to leave call back message. Alert sent to office and FC.

## 2022-10-29 NOTE — Telephone Encounter (Signed)
Answer Assessment - Initial Assessment Questions 1. BLOOD PRESSURE: "What is the blood pressure?" "Did you take at least two measurements 5 minutes apart?"     110/58 P 74,  80/58 2. ONSET: "When did you take your blood pressure?"     Today- 12:15 3. HOW: "How did you obtain the blood pressure?" (e.g., visiting nurse, automatic home BP monitor)     Manual cuff- arm 4. HISTORY: "Do you have a history of low blood pressure?" "What is your blood pressure normally?"     Yes- past hx is low 90/60- has been going down 5. MEDICINES: "Are you taking any medications for blood pressure?" If Yes, ask: "Have they been changed recently?"     Amiodarone, midodrine  6. PULSE RATE: "Do you know what your pulse rate is?"      74 7. OTHER SYMPTOMS: "Have you been sick recently?" "Have you had a recent injury?"     Over night patient declined 911- trouble breathing  Protocols used: Blood Pressure - Low-A-AH

## 2022-10-29 NOTE — Telephone Encounter (Signed)
Spoke with patient . Patient voiced that he has not been taking his midodrine. Voiced that he would start back taking it. Encourage to call back if S/S continued or go to ED for evaluation if S/S worsen.

## 2022-11-12 ENCOUNTER — Inpatient Hospital Stay: Payer: Medicare Other | Admitting: Nurse Practitioner

## 2022-11-14 ENCOUNTER — Telehealth: Payer: Self-pay | Admitting: Nurse Practitioner

## 2022-11-14 NOTE — Telephone Encounter (Signed)
Liji with [facility] is calling in because pt is being discharged from home health PT services due to pt's request. Liji says pt hasn't been very compliant.

## 2022-11-14 NOTE — Telephone Encounter (Signed)
Noted  

## 2022-11-20 ENCOUNTER — Institutional Professional Consult (permissible substitution): Payer: Medicare Other | Admitting: Cardiology

## 2022-11-23 ENCOUNTER — Ambulatory Visit: Payer: Medicare Other | Attending: Cardiology | Admitting: Cardiovascular Disease

## 2022-11-23 NOTE — Progress Notes (Deleted)
  Electrophysiology Office Note:    Date:  11/23/2022   ID:  Nathaniel White, DOB 1960-09-19, MRN [id number]  PCP:  Claiborne Rigg, NP   [facility] Providers Cardiologist:  Verne Carrow, MD Electrophysiologist:  Lanier Prude, MD { Click to update primary MD,subspecialty MD or APP then REFRESH:1}    Referring MD: Beatrice Lecher, PA-C   History of Present Illness:    Nathaniel White is a 62 y.o. male with a medical history significant for Paroxysmal atrial fibrillation, ESRD, CHFrEF, mod-severe MR, pulmonary embolism, pulmonary HTN, COPD, Stroke among other problems referred for atrial fibrillation management.     I discussed the use of AI scribe software for clinical note transcription with the patient, who gave verbal consent to proceed.  ***     Today, ***  EKGs/Labs/Other Studies Reviewed Today:     Echocardiogram:  *** ***   Monitors:  *** ***  Stress testing:  *** ***  Advanced imaging:  *** ***  Cardiac catherization  *** ***  EKG:         Physical Exam:    VS:  There were no vitals taken for this visit.    Wt Readings from Last 3 Encounters:  09/22/22 154 lb 1.6 oz (69.9 kg)  08/25/22 165 lb (74.8 kg)  07/18/22 158 lb (71.7 kg)     GEN: *** Well nourished, well developed in no acute distress CARDIAC: ***RRR, no murmurs, rubs, gallops RESPIRATORY:  Normal work of breathing MUSCULOSKELETAL: *** edema    ASSESSMENT & PLAN:     Paroxysmal atrial fibrillation ***  CHFrEF EF chronically 25-30%  ESRD ***  *** ***  *** ***    Signed, Maurice Small, MD  11/23/2022 8:43 AM    [facility]

## 2022-11-29 ENCOUNTER — Emergency Department (HOSPITAL_COMMUNITY): Payer: Medicare Other

## 2022-11-29 ENCOUNTER — Encounter (HOSPITAL_COMMUNITY): Payer: Self-pay

## 2022-11-29 ENCOUNTER — Other Ambulatory Visit: Payer: Self-pay

## 2022-11-29 ENCOUNTER — Observation Stay (HOSPITAL_COMMUNITY)
Admission: EM | Admit: 2022-11-29 | Discharge: 2022-11-30 | Disposition: A | Discharge status: Home / Self Care | Payer: Medicare Other | Attending: Internal Medicine | Admitting: Internal Medicine

## 2022-11-29 DIAGNOSIS — I5023 Acute on chronic systolic (congestive) heart failure: Secondary | ICD-10-CM | POA: Diagnosis not present

## 2022-11-29 DIAGNOSIS — D631 Anemia in chronic kidney disease: Secondary | ICD-10-CM | POA: Diagnosis not present

## 2022-11-29 DIAGNOSIS — J45909 Unspecified asthma, uncomplicated: Secondary | ICD-10-CM | POA: Diagnosis not present

## 2022-11-29 DIAGNOSIS — N186 End stage renal disease: Secondary | ICD-10-CM | POA: Diagnosis not present

## 2022-11-29 DIAGNOSIS — J189 Pneumonia, unspecified organism: Secondary | ICD-10-CM

## 2022-11-29 DIAGNOSIS — Z8673 Personal history of transient ischemic attack (TIA), and cerebral infarction without residual deficits: Secondary | ICD-10-CM | POA: Insufficient documentation

## 2022-11-29 DIAGNOSIS — Z1152 Encounter for screening for COVID-19: Secondary | ICD-10-CM | POA: Insufficient documentation

## 2022-11-29 DIAGNOSIS — Z992 Dependence on renal dialysis: Secondary | ICD-10-CM | POA: Insufficient documentation

## 2022-11-29 DIAGNOSIS — J441 Chronic obstructive pulmonary disease with (acute) exacerbation: Principal | ICD-10-CM | POA: Insufficient documentation

## 2022-11-29 DIAGNOSIS — J9621 Acute and chronic respiratory failure with hypoxia: Principal | ICD-10-CM | POA: Insufficient documentation

## 2022-11-29 DIAGNOSIS — Z7901 Long term (current) use of anticoagulants: Secondary | ICD-10-CM | POA: Diagnosis not present

## 2022-11-29 DIAGNOSIS — I132 Hypertensive heart and chronic kidney disease with heart failure and with stage 5 chronic kidney disease, or end stage renal disease: Secondary | ICD-10-CM | POA: Insufficient documentation

## 2022-11-29 DIAGNOSIS — I480 Paroxysmal atrial fibrillation: Secondary | ICD-10-CM | POA: Diagnosis not present

## 2022-11-29 DIAGNOSIS — J449 Chronic obstructive pulmonary disease, unspecified: Secondary | ICD-10-CM

## 2022-11-29 DIAGNOSIS — J9600 Acute respiratory failure, unspecified whether with hypoxia or hypercapnia: Secondary | ICD-10-CM | POA: Diagnosis present

## 2022-11-29 DIAGNOSIS — J9601 Acute respiratory failure with hypoxia: Secondary | ICD-10-CM

## 2022-11-29 DIAGNOSIS — Z7951 Long term (current) use of inhaled steroids: Secondary | ICD-10-CM | POA: Insufficient documentation

## 2022-11-29 DIAGNOSIS — Z87891 Personal history of nicotine dependence: Secondary | ICD-10-CM | POA: Diagnosis not present

## 2022-11-29 DIAGNOSIS — Z79899 Other long term (current) drug therapy: Secondary | ICD-10-CM | POA: Diagnosis not present

## 2022-11-29 DIAGNOSIS — I959 Hypotension, unspecified: Secondary | ICD-10-CM | POA: Insufficient documentation

## 2022-11-29 DIAGNOSIS — E1122 Type 2 diabetes mellitus with diabetic chronic kidney disease: Secondary | ICD-10-CM | POA: Diagnosis not present

## 2022-11-29 DIAGNOSIS — R0602 Shortness of breath: Secondary | ICD-10-CM | POA: Diagnosis present

## 2022-11-29 LAB — COMPREHENSIVE METABOLIC PANEL
ALT: 77 U/L — ABNORMAL HIGH (ref 0–44)
ALT: 88 U/L — ABNORMAL HIGH (ref 0–44)
AST: 184 U/L — ABNORMAL HIGH (ref 15–41)
AST: 203 U/L — ABNORMAL HIGH (ref 15–41)
Albumin: 2.5 g/dL — ABNORMAL LOW (ref 3.5–5.0)
Albumin: 2.5 g/dL — ABNORMAL LOW (ref 3.5–5.0)
Alkaline Phosphatase: 93 U/L (ref 38–126)
Alkaline Phosphatase: 88 U/L (ref 38–126)
Anion gap: 14 (ref 5–15)
Anion gap: 17 — ABNORMAL HIGH (ref 5–15)
BUN: 50 mg/dL — ABNORMAL HIGH (ref 6–20)
BUN: 57 mg/dL — ABNORMAL HIGH (ref 6–20)
CO2: 24 mmol/L (ref 22–32)
CO2: 21 mmol/L — ABNORMAL LOW (ref 22–32)
Calcium: 9.5 mg/dL (ref 8.9–10.3)
Calcium: 9.8 mg/dL (ref 8.9–10.3)
Chloride: 99 mmol/L (ref 98–111)
Chloride: 98 mmol/L (ref 98–111)
Creatinine, Ser: 8.21 mg/dL — ABNORMAL HIGH (ref 0.61–1.24)
Creatinine, Ser: 8.6 mg/dL — ABNORMAL HIGH (ref 0.61–1.24)
GFR, Estimated: 7 mL/min — ABNORMAL LOW (ref >60)
GFR, Estimated: 7 mL/min — ABNORMAL LOW (ref >60)
Glucose, Bld: 129 mg/dL — ABNORMAL HIGH (ref 70–99)
Glucose, Bld: 148 mg/dL — ABNORMAL HIGH (ref 70–99)
Potassium: 3.6 mmol/L (ref 3.5–5.1)
Potassium: 4.1 mmol/L (ref 3.5–5.1)
Sodium: 137 mmol/L (ref 135–145)
Sodium: 136 mmol/L (ref 135–145)
Total Bilirubin: 0.8 mg/dL (ref <1.2)
Total Bilirubin: 0.6 mg/dL (ref <1.2)
Total Protein: 7.8 g/dL (ref 6.5–8.1)
Total Protein: 8.3 g/dL — ABNORMAL HIGH (ref 6.5–8.1)

## 2022-11-29 LAB — CBC WITH DIFFERENTIAL/PLATELET
Abs Immature Granulocytes: 0.02 10*3/uL (ref 0.00–0.07)
Abs Immature Granulocytes: 0 10*3/uL (ref 0.00–0.07)
Basophils Absolute: 0.1 10*3/uL (ref 0.0–0.1)
Basophils Absolute: 0 10*3/uL (ref 0.0–0.1)
Basophils Relative: 1 %
Basophils Relative: 0 %
Eosinophils Absolute: 0.3 10*3/uL (ref 0.0–0.5)
Eosinophils Absolute: 0 10*3/uL (ref 0.0–0.5)
Eosinophils Relative: 6 %
Eosinophils Relative: 2 %
HCT: 35.2 % — ABNORMAL LOW (ref 39.0–52.0)
HCT: 33.4 % — ABNORMAL LOW (ref 39.0–52.0)
Hemoglobin: 10.4 g/dL — ABNORMAL LOW (ref 13.0–17.0)
Hemoglobin: 10.3 g/dL — ABNORMAL LOW (ref 13.0–17.0)
Immature Granulocytes: 0 %
Lymphocytes Relative: 22 %
Lymphocytes Relative: 4 %
Lymphs Abs: 1.2 10*3/uL (ref 0.7–4.0)
Lymphs Abs: 0.1 10*3/uL — ABNORMAL LOW (ref 0.7–4.0)
MCH: 26.3 pg (ref 26.0–34.0)
MCH: 27 pg (ref 26.0–34.0)
MCHC: 29.5 g/dL — ABNORMAL LOW (ref 30.0–36.0)
MCHC: 30.8 g/dL (ref 30.0–36.0)
MCV: 89.1 fL (ref 80.0–100.0)
MCV: 87.4 fL (ref 80.0–100.0)
Monocytes Absolute: 0.5 10*3/uL (ref 0.1–1.0)
Monocytes Absolute: 0 10*3/uL — ABNORMAL LOW (ref 0.1–1.0)
Monocytes Relative: 9 %
Monocytes Relative: 1 %
Myelocytes: 2 %
Neutro Abs: 3.4 10*3/uL (ref 1.7–7.7)
Neutro Abs: 2.2 10*3/uL (ref 1.7–7.7)
Neutrophils Relative %: 62 %
Neutrophils Relative %: 91 %
Platelets: 105 10*3/uL — ABNORMAL LOW (ref 150–400)
Platelets: 104 10*3/uL — ABNORMAL LOW (ref 150–400)
RBC: 3.95 MIL/uL — ABNORMAL LOW (ref 4.22–5.81)
RBC: 3.82 MIL/uL — ABNORMAL LOW (ref 4.22–5.81)
RDW: 15.9 % — ABNORMAL HIGH (ref 11.5–15.5)
RDW: 16.1 % — ABNORMAL HIGH (ref 11.5–15.5)
WBC: 5.5 10*3/uL (ref 4.0–10.5)
WBC: 2.4 10*3/uL — ABNORMAL LOW (ref 4.0–10.5)
nRBC: 0 % (ref 0.0–0.2)
nRBC: 0 % (ref 0.0–0.2)
nRBC: 0 /100{WBCs}

## 2022-11-29 LAB — RESP PANEL BY RT-PCR (RSV, FLU A&B, COVID)  RVPGX2
Influenza A by PCR: NEGATIVE
Influenza B by PCR: NEGATIVE
Resp Syncytial Virus by PCR: NEGATIVE
SARS Coronavirus 2 by RT PCR: NEGATIVE

## 2022-11-29 LAB — HEPATITIS B SURFACE ANTIGEN: Hepatitis B Surface Ag: NONREACTIVE

## 2022-11-29 LAB — MRSA NEXT GEN BY PCR, NASAL: MRSA by PCR Next Gen: NOT DETECTED

## 2022-11-29 LAB — HEMOGLOBIN A1C
Hgb A1c MFr Bld: 4.8 % (ref 4.8–5.6)
Mean Plasma Glucose: 91.06 mg/dL

## 2022-11-29 LAB — CBG MONITORING, ED: Glucose-Capillary: 129 mg/dL — ABNORMAL HIGH (ref 70–99)

## 2022-11-29 MED ORDER — CHLORHEXIDINE GLUCONATE CLOTH 2 % EX PADS
6.0000 | MEDICATED_PAD | Freq: Every day | CUTANEOUS | Status: DC
  Administered 2022-11-29 – 2022-11-30 (×2): 6 via TOPICAL

## 2022-11-29 MED ORDER — APIXABAN 5 MG PO TABS
5.0000 mg | ORAL_TABLET | Freq: Two times a day (BID) | ORAL | Status: DC
  Administered 2022-11-29 – 2022-11-30 (×3): 5 mg via ORAL
  Filled 2022-11-29 (×3): qty 1

## 2022-11-29 MED ORDER — SODIUM CHLORIDE 0.9 % IV SOLN
1.0000 g | INTRAVENOUS | Status: DC
  Filled 2022-11-29: qty 10

## 2022-11-29 MED ORDER — SODIUM CHLORIDE 0.9 % IV SOLN: 2.0000 g | Freq: Three times a day (TID) | INTRAVENOUS | Status: DC

## 2022-11-29 MED ORDER — MOMETASONE FURO-FORMOTEROL FUM 200-5 MCG/ACT IN AERO
2.0000 | INHALATION_SPRAY | Freq: Two times a day (BID) | RESPIRATORY_TRACT | Status: DC
  Administered 2022-11-29: 2 via RESPIRATORY_TRACT
  Filled 2022-11-29: qty 8.8

## 2022-11-29 MED ORDER — METHYLPREDNISOLONE SODIUM SUCC 40 MG IJ SOLR
40.0000 mg | Freq: Two times a day (BID) | INTRAMUSCULAR | Status: AC
  Administered 2022-11-29 – 2022-11-30 (×2): 40 mg via INTRAVENOUS
  Filled 2022-11-29 (×2): qty 1

## 2022-11-29 MED ORDER — ACETAMINOPHEN 325 MG PO TABS: 650.0000 mg | ORAL_TABLET | Freq: Four times a day (QID) | ORAL | Status: DC | PRN

## 2022-11-29 MED ORDER — VANCOMYCIN HCL 1500 MG/300ML IV SOLN
1500.0000 mg | Freq: Once | INTRAVENOUS | Status: AC
  Administered 2022-11-29: 1500 mg via INTRAVENOUS
  Filled 2022-11-29: qty 300

## 2022-11-29 MED ORDER — POLYETHYLENE GLYCOL 3350 17 G PO PACK
17.0000 g | PACK | Freq: Every day | ORAL | Status: DC | PRN
  Administered 2022-11-30: 17 g via ORAL
  Filled 2022-11-29: qty 1

## 2022-11-29 MED ORDER — IPRATROPIUM-ALBUTEROL 0.5-2.5 (3) MG/3ML IN SOLN
3.0000 mL | Freq: Four times a day (QID) | RESPIRATORY_TRACT | Status: DC
Start: 2022-11-29 — End: 2022-11-30
  Administered 2022-11-29 – 2022-11-30 (×4): 3 mL via RESPIRATORY_TRACT
  Filled 2022-11-29 (×4): qty 3

## 2022-11-29 MED ORDER — LEVALBUTEROL HCL 0.63 MG/3ML IN NEBU: 0.6300 mg | INHALATION_SOLUTION | Freq: Four times a day (QID) | RESPIRATORY_TRACT | Status: DC

## 2022-11-29 MED ORDER — SODIUM CHLORIDE 0.9 % IV SOLN
1.0000 g | Freq: Once | INTRAVENOUS | Status: AC
  Administered 2022-11-29: 1 g via INTRAVENOUS
  Filled 2022-11-29: qty 10

## 2022-11-29 MED ORDER — SUCROFERRIC OXYHYDROXIDE 500 MG PO CHEW
1000.0000 mg | CHEWABLE_TABLET | Freq: Two times a day (BID) | ORAL | Status: DC
  Administered 2022-11-29: 1000 mg via ORAL
  Filled 2022-11-29 (×3): qty 2

## 2022-11-29 MED ORDER — ONDANSETRON HCL 4 MG PO TABS: 4.0000 mg | ORAL_TABLET | Freq: Four times a day (QID) | ORAL | Status: DC | PRN

## 2022-11-29 MED ORDER — ALBUTEROL SULFATE (2.5 MG/3ML) 0.083% IN NEBU: 2.5000 mg | INHALATION_SOLUTION | Freq: Four times a day (QID) | RESPIRATORY_TRACT | Status: DC | PRN

## 2022-11-29 MED ORDER — AMIODARONE HCL 200 MG PO TABS
200.0000 mg | ORAL_TABLET | Freq: Every day | ORAL | Status: DC
  Administered 2022-11-29 – 2022-11-30 (×2): 200 mg via ORAL
  Filled 2022-11-29 (×2): qty 1

## 2022-11-29 MED ORDER — LEVALBUTEROL HCL 0.63 MG/3ML IN NEBU
0.6300 mg | INHALATION_SOLUTION | RESPIRATORY_TRACT | Status: DC | PRN
  Administered 2022-11-29: 0.63 mg via RESPIRATORY_TRACT
  Filled 2022-11-29: qty 3

## 2022-11-29 MED ORDER — ACETAMINOPHEN 650 MG RE SUPP
650.0000 mg | Freq: Four times a day (QID) | RECTAL | Status: DC | PRN
Start: 2022-11-29 — End: 2022-11-30

## 2022-11-29 MED ORDER — ACETAMINOPHEN 500 MG PO TABS: 500.0000 mg | ORAL_TABLET | Freq: Three times a day (TID) | ORAL | Status: DC | PRN

## 2022-11-29 MED ORDER — CHLORHEXIDINE GLUCONATE CLOTH 2 % EX PADS
6.0000 | MEDICATED_PAD | Freq: Every day | CUTANEOUS | Status: DC
  Administered 2022-11-30: 6 via TOPICAL

## 2022-11-29 MED ORDER — MIDODRINE HCL 5 MG PO TABS
10.0000 mg | ORAL_TABLET | Freq: Three times a day (TID) | ORAL | Status: DC
  Administered 2022-11-29 – 2022-11-30 (×3): 10 mg via ORAL
  Filled 2022-11-29 (×4): qty 2

## 2022-11-29 MED ORDER — ONDANSETRON HCL 4 MG/2ML IJ SOLN: 4.0000 mg | Freq: Four times a day (QID) | INTRAMUSCULAR | Status: DC | PRN

## 2022-11-29 MED ORDER — PREDNISONE 20 MG PO TABS
40.0000 mg | ORAL_TABLET | Freq: Every day | ORAL | Status: DC
  Administered 2022-11-30: 40 mg via ORAL
  Filled 2022-11-29: qty 2

## 2022-11-29 MED ORDER — GLUCOSE 40 % PO GEL: 1.0000 | ORAL | Status: AC

## 2022-11-29 NOTE — Plan of Care (Signed)
  Problem: Nutritional: Goal: Maintenance of adequate nutrition will improve Outcome: Progressing   Problem: Clinical Measurements: Goal: Will remain free from infection Outcome: Progressing Goal: Diagnostic test results will improve Outcome: Progressing Goal: Respiratory complications will improve Outcome: Progressing   Problem: Activity: Goal: Risk for activity intolerance will decrease Outcome: Progressing   Problem: Coping: Goal: Level of anxiety will decrease Outcome: Progressing   Problem: Pain Management: Goal: General experience of comfort will improve Outcome: Progressing   Problem: Safety: Goal: Ability to remain free from injury will improve Outcome: Progressing

## 2022-11-29 NOTE — ED Notes (Addendum)
ED TO INPATIENT HANDOFF REPORT  ED Nurse Name and Phone #: [phone]  S Name/Age/Gender Nathaniel White 62 y.o. male Room/Bed: 027C/027C  Code Status   Code Status: Full Code  Home/SNF/Other Home Patient oriented to: self, place, time, and situation Is this baseline? Yes   Triage Complete: Triage complete  Chief Complaint Acute respiratory failure (HCC) [J96.00]  Triage Note No notes on file   Allergies Allergies  Allergen Reactions   Codeine Shortness Of Breath   Dextromethorphan-Guaifenesin Shortness Of Breath and Other (See Comments)   Iron Dextran Shortness Of Breath and Other (See Comments)   Morphine And Codeine Shortness Of Breath    Other Reaction(s): Other (See Comments)    Level of Care/Admitting Diagnosis ED Disposition     ED Disposition  Admit   Condition  --   Comment  Hospital Area: [facility] [[id number]]  Level of Care: Progressive [102]  Admit to Progressive based on following criteria: RESPIRATORY PROBLEMS hypoxemic/hypercapnic respiratory failure that is responsive to NIPPV (BiPAP) or High Flow Nasal Cannula (6-80 lpm). Frequent assessment/intervention, no > Q2 hrs < Q4 hrs, to maintain oxygenation and pulmonary hygiene.  May admit patient to [facility] or [facility] if equivalent level of care is available:: No  Covid Evaluation: Confirmed COVID Negative  Diagnosis: Acute respiratory failure (HCC) [518.81.ICD-9-CM]  Admitting Physician: Lurline Del [[id number]]  Attending Physician: Lurline Del [[id number]]  Certification:: I certify this patient will need inpatient services for at least 2 midnights  Expected Medical Readiness: 12/03/2022          B Medical/Surgery History Past Medical History:  Diagnosis Date   Anemia    Anxiety    Asthma    Complication from renal dialysis device 11/02/2013   Complication of anesthesia 2017   according to pt and spouse pt was moving around and cough while under and pt  had difficulty waking up so the anesthesia had to be reversed. and pt admitted to ICU.    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/2008   Qualifier: Diagnosis of  By: Russella Dar MD Marylu Lund    ESRD    on HD, T-TH-Sat - [place]   Hemiparesis due to old stroke [facility])    left   HFrEF (heart failure with reduced ejection fraction) (HCC)    NICM // TTE 06/2022: EF 25-30, sever RV dysfunction, mod to severe MR   Hypertension    Hx, not current problems, no meds   LV dysfunction    EF 25-30% by echo 07/2011   Memory loss due to medical condition    due to stroke   MR (mitral regurgitation)    moderate to severe, echo 07/2011   Nausea with vomiting, unspecified 06/15/2021   Paroxysmal atrial fibrillation (HCC)    Peripheral vascular disease (HCC)    Shortness of breath    with exertion   Stroke (HCC)    TIA's-left sided weakness   Tobacco abuse    Past Surgical History:  Procedure Laterality Date   ABDOMINAL AORTOGRAM W/LOWER EXTREMITY Bilateral 07/21/2018   Procedure: ABDOMINAL AORTOGRAM W/LOWER EXTREMITY;  Surgeon: Maeola Harman, MD;  Location: [facility] INVASIVE CV LAB;  Service: Cardiovascular;  Laterality: Bilateral;   AMPUTATION Left 11/05/2018  Procedure: AMPUTATION SECOND TOE LEFT FOOT;  Surgeon: Maeola Harman, MD;  Location: [facility] OR;  Service: Vascular;  Laterality: Left;   AMPUTATION Left 02/13/2021   Procedure: Left small finger AMPUTATION DIGIT;  Surgeon: Marlyne Beards, MD;  Location: [facility] OR;  Service: Orthopedics;  Laterality: Left;   AV FISTULA PLACEMENT     CARDIAC CATHETERIZATION     [facility]   COLONOSCOPY W/ BIOPSIES AND POLYPECTOMY     FISTULA SUPERFICIALIZATION Left 11/10/2013   Procedure: FISTULA PLICATION;  Surgeon:  Nada Libman, MD;  Location: [facility] OR;  Service: Vascular;  Laterality: Left;   INSERTION OF DIALYSIS CATHETER N/A 06/20/2021   Procedure: INSERTION OF DIALYSIS CATHETER;  Surgeon: Victorino Sparrow, MD;  Location: [facility] OR;  Service: Vascular;  Laterality: N/A;   KIDNEY TRANSPLANT     2011 rejected kidney 2012 back on dialysis   LEFT AND RIGHT HEART CATHETERIZATION WITH CORONARY ANGIOGRAM N/A 10/09/2013   Procedure: LEFT AND RIGHT HEART CATHETERIZATION WITH CORONARY ANGIOGRAM;  Surgeon: Kathleene Hazel, MD;  Location: [facility] CATH LAB;  Service: Cardiovascular;  Laterality: N/A;   LIGATION OF ARTERIOVENOUS  FISTULA Left 06/20/2021   Procedure: LIGATION OF ARTERIOVENOUS  FISTULA;  Surgeon: Victorino Sparrow, MD;  Location: [facility] OR;  Service: Vascular;  Laterality: Left;   PERIPHERAL VASCULAR INTERVENTION Left 07/21/2018   Procedure: PERIPHERAL VASCULAR INTERVENTION;  Surgeon: Maeola Harman, MD;  Location: [facility] INVASIVE CV LAB;  Service: Cardiovascular;  Laterality: Left;  Popliteal   REVISON OF ARTERIOVENOUS FISTULA Left 08/26/2013   Procedure: EXCISION OF ERODED SKIN AND EXPLORATION OF MAIN LEFT UPPER ARM AV FISTULA;  Surgeon: Larina Earthly, MD;  Location: [facility] OR;  Service: Vascular;  Laterality: Left;   REVISON OF ARTERIOVENOUS FISTULA Left 02/05/2014   Procedure: REPAIR OF ARTERIOVENOUS FISTULA ANEURYSM;  Surgeon: Nada Libman, MD;  Location: [facility] OR;  Service: Vascular;  Laterality: Left;   REVISON OF ARTERIOVENOUS FISTULA Left 03/10/2021   Procedure: LEFT ARM REVISION OF ARTERIOVENOUS FISTULA WITH PLICATION;  Surgeon: Maeola Harman, MD;  Location: [facility] OR;  Service: Vascular;  Laterality: Left;   RIGHT/LEFT HEART CATH AND CORONARY ANGIOGRAPHY N/A 06/21/2022   Procedure: RIGHT/LEFT HEART CATH AND CORONARY ANGIOGRAPHY;  Surgeon: Lennette Bihari, MD;  Location: [facility] INVASIVE CV LAB;  Service: Cardiovascular;  Laterality: N/A;   SCROTAL EXPLORATION N/A 09/16/2022   Procedure: PENILE DEBRIDMENT;   Surgeon: Rene Paci, MD;  Location: [facility] OR;  Service: Urology;  Laterality: N/A;   SHUNTOGRAM N/A 11/05/2012   Procedure: Fistulogram;  Surgeon: Nada Libman, MD;  Location: [facility] CATH LAB;  Service: Cardiovascular;  Laterality: N/A;   TEE WITHOUT CARDIOVERSION  08/17/2011   Procedure: TRANSESOPHAGEAL ECHOCARDIOGRAM (TEE);  Surgeon: Laurey Morale, MD;  Location: [facility] ENDOSCOPY;  Service: Cardiovascular;  Laterality: N/A;   ULTRASOUND GUIDANCE FOR VASCULAR ACCESS  06/20/2021   Procedure: ULTRASOUND GUIDANCE FOR VASCULAR ACCESS;  Surgeon: Victorino Sparrow, MD;  Location: [facility] OR;  Service: Vascular;;   VIDEO ASSISTED THORACOSCOPY (VATS)/DECORTICATION  08/10/11     A IV Location/Drains/Wounds Patient Lines/Drains/Airways Status     Active Line/Drains/Airways     Name Placement date Placement time Site Days   Peripheral IV 11/29/22 22 G Anterior;Right Hand 11/29/22  0153  Hand  less than 1   Fistula / Graft Left Upper arm Arteriovenous fistula --  --  Upper arm  --   Fistula / Graft Right Forearm --  --  Forearm  --  Hemodialysis Catheter Left Subclavian Double lumen Permanent (Tunneled) 06/20/21  0851  Subclavian  527   Pressure Injury 09/16/22 Buttocks Right Stage 2 -  Partial thickness loss of dermis presenting as a shallow open injury with a red, pink wound bed without slough. skin tear around rectum, inside right butt check 09/16/22  2000  -- 74   Wound / Incision (Open or Dehisced) 09/16/22 Non-pressure wound Penis Mid 09/16/22  0234  Penis  74   Wound / Incision (Open or Dehisced) 09/16/22 Non-pressure wound Knee Anterior;Left 09/16/22  0238  Knee  74            Intake/Output Last 24 hours No intake or output data in the 24 hours ending 11/29/22 1351  Labs/Imaging Results for orders placed or performed during the hospital encounter of 11/29/22 (from the past 48 hour(s))  CBC with Differential     Status: Abnormal   Collection Time: 11/29/22  3:32 AM  Result Value Ref  Range   WBC 5.5 4.0 - 10.5 K/uL   RBC 3.95 (L) 4.22 - 5.81 MIL/uL   Hemoglobin 10.4 (L) 13.0 - 17.0 g/dL   HCT 95.6 (L) 38.7 - 56.4 %   MCV 89.1 80.0 - 100.0 fL   MCH 26.3 26.0 - 34.0 pg   MCHC 29.5 (L) 30.0 - 36.0 g/dL   RDW 33.2 (H) 95.1 - 88.4 %   Platelets 105 (L) 150 - 400 K/uL   nRBC 0.0 0.0 - 0.2 %   Neutrophils Relative % 62 %   Neutro Abs 3.4 1.7 - 7.7 K/uL   Lymphocytes Relative 22 %   Lymphs Abs 1.2 0.7 - 4.0 K/uL   Monocytes Relative 9 %   Monocytes Absolute 0.5 0.1 - 1.0 K/uL   Eosinophils Relative 6 %   Eosinophils Absolute 0.3 0.0 - 0.5 K/uL   Basophils Relative 1 %   Basophils Absolute 0.1 0.0 - 0.1 K/uL   Immature Granulocytes 0 %   Abs Immature Granulocytes 0.02 0.00 - 0.07 K/uL    Comment: Performed at [facility], [address], [place], [place] [address]  Comprehensive metabolic panel     Status: Abnormal   Collection Time: 11/29/22  3:32 AM  Result Value Ref Range   Sodium 137 135 - 145 mmol/L   Potassium 3.6 3.5 - 5.1 mmol/L   Chloride 99 98 - 111 mmol/L   CO2 24 22 - 32 mmol/L   Glucose, Bld 129 (H) 70 - 99 mg/dL    Comment: Glucose reference range applies only to samples taken after fasting for at least 8 hours.   BUN 50 (H) 6 - 20 mg/dL   Creatinine, Ser 3.01 (H) 0.61 - 1.24 mg/dL   Calcium 9.5 8.9 - 60.1 mg/dL   Total Protein 7.8 6.5 - 8.1 g/dL   Albumin 2.5 (L) 3.5 - 5.0 g/dL   AST 093 (H) 15 - 41 U/L   ALT 77 (H) 0 - 44 U/L   Alkaline Phosphatase 93 38 - 126 U/L   Total Bilirubin 0.8 <1.2 mg/dL   GFR, Estimated 7 (L) >60 mL/min    Comment: (NOTE) Calculated using the CKD-EPI Creatinine Equation (2021)    Anion gap 14 5 - 15    Comment: Performed at [facility], [address], [place], [place] [address]  Resp panel by RT-PCR (RSV, Flu A&B, Covid) Anterior Nasal Swab     Status: None   Collection Time: 11/29/22  5:44 AM  Specimen: Anterior Nasal Swab  Result Value Ref Range   SARS Coronavirus 2 by RT PCR NEGATIVE NEGATIVE    Influenza A by PCR NEGATIVE NEGATIVE   Influenza B by PCR NEGATIVE NEGATIVE    Comment: (NOTE) The Xpert Xpress SARS-CoV-2/FLU/RSV plus assay is intended as an aid in the diagnosis of influenza from Nasopharyngeal swab specimens and should not be used as a sole basis for treatment. Nasal washings and aspirates are unacceptable for Xpert Xpress SARS-CoV-2/FLU/RSV testing.  Fact Sheet for Patients: BloggerCourse.com  Fact Sheet for Healthcare Providers: SeriousBroker.it  This test is not yet approved or cleared by the [place] FDA and has been authorized for detection and/or diagnosis of SARS-CoV-2 by FDA under an Emergency Use Authorization (EUA). This EUA will remain in effect (meaning this test can be used) for the duration of the COVID-19 declaration under Section 564(b)(1) of the Act, 21 U.S.C. section 360bbb-3(b)(1), unless the authorization is terminated or revoked.     Resp Syncytial Virus by PCR NEGATIVE NEGATIVE    Comment: (NOTE) Fact Sheet for Patients: BloggerCourse.com  Fact Sheet for Healthcare Providers: SeriousBroker.it  This test is not yet approved or cleared by the [place] FDA and has been authorized for detection and/or diagnosis of SARS-CoV-2 by FDA under an Emergency Use Authorization (EUA). This EUA will remain in effect (meaning this test can be used) for the duration of the COVID-19 declaration under Section 564(b)(1) of the Act, 21 U.S.C. section 360bbb-3(b)(1), unless the authorization is terminated or revoked.  Performed at [facility], [address], [place], [place] [address]    DG Chest Port 1 View  Result Date: 11/29/2022 CLINICAL DATA:  Shortness of breath and cough EXAM: PORTABLE CHEST 1 VIEW COMPARISON:  09/18/2022 FINDINGS: Cardiac shadow is enlarged but stable. Dialysis catheter is again noted on the left. Increasing  patchy airspace opacities are noted in the bases bilaterally. Chronic blunting of the costophrenic angles is seen. No large effusion is noted. No bony abnormality is seen. IMPRESSION: Increase in bibasilar airspace opacities when compared with the prior exam. Electronically Signed   By: Alcide Clever M.D.   On: 11/29/2022 02:47    Pending Labs Unresulted Labs (From admission, onward)     Start     Ordered   11/30/22 0500  CBC  Tomorrow morning,   R        11/29/22 1030   11/30/22 0500  Comprehensive metabolic panel  Tomorrow morning,   R        11/29/22 1030   11/29/22 1142  MRSA Next Gen by PCR, Nasal  (MRSA Screening)  Once,   R        11/29/22 1141   11/29/22 1028  Comprehensive metabolic panel  Once,   R        11/29/22 1030   11/29/22 1028  CBC with Differential/Platelet  Once,   R        11/29/22 1030   11/29/22 1028  Expectorated Sputum Assessment w Gram Stain, Rflx to Resp Cult  Once,   R        11/29/22 1030   11/29/22 1020  Hemoglobin A1c  (Glycemic Control (SSI)  Q 4 Hours / Glycemic Control (SSI)  AC +/- HS)  Once,   R       Comments: To assess prior glycemic control    11/29/22 1030            Vitals/Pain Today's Vitals   11/29/22 1045  11/29/22 1155 11/29/22 1158 11/29/22 1230  BP: (!) 147/84 (!) 143/83    Pulse:  64    Resp: 16 (!) 22    Temp:    97.7 F (36.5 C)  TempSrc:    Oral  SpO2: 100% 100%    Weight:   74.8 kg   Height:   5\' 9"  (1.753 m)   PainSc:        Isolation Precautions No active isolations  Medications Medications  amiodarone (PACERONE) tablet 200 mg (has no administration in time range)  midodrine (PROAMATINE) tablet 10 mg (has no administration in time range)  apixaban (ELIQUIS) tablet 5 mg (has no administration in time range)  sucroferric oxyhydroxide (VELPHORO) chewable tablet 1,000 mg (has no administration in time range)  mometasone-formoterol (DULERA) 200-5 MCG/ACT inhaler 2 puff (has no administration in time range)   acetaminophen (TYLENOL) tablet 650 mg (has no administration in time range)    Or  acetaminophen (TYLENOL) suppository 650 mg (has no administration in time range)  ondansetron (ZOFRAN) tablet 4 mg (has no administration in time range)    Or  ondansetron (ZOFRAN) injection 4 mg (has no administration in time range)  dextrose (GLUTOSE) oral gel 40% (has no administration in time range)  methylPREDNISolone sodium succinate (SOLU-MEDROL) 40 mg/mL injection 40 mg (has no administration in time range)    Followed by  predniSONE (DELTASONE) tablet 40 mg (has no administration in time range)  ipratropium-albuterol (DUONEB) 0.5-2.5 (3) MG/3ML nebulizer solution 3 mL (has no administration in time range)  levalbuterol (XOPENEX) nebulizer solution 0.63 mg (has no administration in time range)  vancomycin (VANCOREADY) IVPB 1500 mg/300 mL (1,500 mg Intravenous New Bag/Given 11/29/22 1232)  ceFEPIme (MAXIPIME) 1 g in sodium chloride 0.9 % 100 mL IVPB (has no administration in time range)  ceFEPIme (MAXIPIME) 1 g in sodium chloride 0.9 % 100 mL IVPB (has no administration in time range)    Mobility Pt does not walk at home     Focused Assessments Cardiac Assessment Handoff:  Cardiac Rhythm: Normal sinus rhythm Lab Results  Component Value Date   CKTOTAL 263 (H) 03/13/2011   CKMB 4.3 (H) 03/13/2011   TROPONINI 0.07 (HH) 03/03/2018   No results found for: "DDIMER" Does the Patient currently have chest pain? No   , Renal Assessment Handoff:  Hemodialysis Schedule: Hemodialysis Schedule: Tuesday/Thursday/Saturday Last Hemodialysis date and time:    Restricted appendage: left arm  , Pulmonary Assessment Handoff:  Lung sounds: Bilateral Breath Sounds: Diminished, Fine crackles O2 Device: Nasal Cannula      R Recommendations: See Admitting Provider Note  Report given to:   Additional Notes:

## 2022-11-29 NOTE — Progress Notes (Signed)
Pharmacy Antibiotic Note  Nathaniel White is a 62 y.o. male admitted on 11/29/2022 with pneumonia.  Pharmacy has been consulted for Vancomycin dosing. Pt has ESRD and receives HD on TTS. Plan for HD on 11/21, pending nephrology consult recs.  Plan: Initiate loading dose of Vancomycin 1500mg  IV x 1, followed by  Vancomycin 750mg  IV every T/Th/Sa with HD    > Goal vancomycin trough level 15-20 mcg/mL    > Check vancomycin levels at steady state  Cefepime 1g IV x1, then 1g IV q24h post-HD on HD days Monitor daily CBC, temp, SCr, and for clinical signs of improvement  F/u cultures and de-escalate antibiotics as able      Temp (24hrs), Avg:97 F (36.1 C), Min:96.3 F (35.7 C), Max:97.7 F (36.5 C)  Recent Labs  Lab 11/29/22 0332  WBC 5.5  CREATININE 8.21*    CrCl cannot be calculated (Unknown ideal weight.).    Allergies  Allergen Reactions   Codeine Shortness Of Breath   Dextromethorphan-Guaifenesin Shortness Of Breath and Other (See Comments)   Iron Dextran Shortness Of Breath and Other (See Comments)   Morphine And Codeine Shortness Of Breath    Other Reaction(s): Other (See Comments)    Antimicrobials this admission: Cefepime 11/21 >>  Vancomycin 11/21 >>   Dose adjustments this admission: N/A  Microbiology results: 11/21 Sputum: sent  11/21 MRSA PCR: ordered  Thank you for allowing pharmacy to be a part of this patient's care.  Wilburn Cornelia, PharmD, BCPS Clinical Pharmacist 11/29/2022 12:05 PM   Please refer to [facility] for pharmacy phone number

## 2022-11-29 NOTE — ED Provider Notes (Signed)
[place] EMERGENCY DEPARTMENT AT [facility] Provider Note   CSN: [id number] Arrival date & time: 11/29/22  1308     History  Chief Complaint  Patient presents with   Shortness of Breath    Patient to ED via EMS with complaint of SOB x 2 hours. EMS found patient on room air 80% and complaining of inhaler not helping after using it. EMS reports giving patient 2 grams of magnesium sulfate, 2 duo nebs, 1 albuterol and 125 mg of solumedrol. Patient using Cpap as entering ED.    Nathaniel White is a 62 y.o. male.  The history is provided by the patient and medical records.   62 year old male with history of hypertension, ESRD on HD (Tue/Thur/Sat), cirrhosis, COPD, presenting to the ED for SOB.  Called EMS due to SOB, worsening throughout the day.  Sitting outside in wheelchair upon their arrival acutely distressed, sats 80%.  Generally does not require O2.  Given 2g Mg+, 125mg  solumedrol, duonebs x2 and started on CPAP with some improvement.  Has been compliant with HD treatments, last session Tuesday (full treatment).  Denies recent cough/fever/chills.  Home Medications Prior to Admission medications   Medication Sig Start Date End Date Taking? Authorizing Provider  acetaminophen (TYLENOL) 500 MG tablet Take 1 tablet (500 mg total) by mouth every 6 (six) hours as needed. 04/11/18   Georgetta Haber, NP  albuterol (PROAIR HFA) 108 (90 Base) MCG/ACT inhaler Inhale 2 puffs into the lungs every 6 (six) hours as needed for wheezing or shortness of breath. 08/25/21   Hoy Register, MD  albuterol (PROVENTIL) (2.5 MG/3ML) 0.083% nebulizer solution Take 3 mLs (2.5 mg total) by nebulization every 6 (six) hours as needed for wheezing or shortness of breath. 08/25/21   Hoy Register, MD  amiodarone (PACERONE) 200 MG tablet Take 1 tablet (200 mg total) by mouth daily. 09/25/22   Billey Co, MD  ELIQUIS 5 MG TABS tablet Take 5 mg by mouth 2 (two) times daily. 12/09/21   [provider]  fluticasone-salmeterol (ADVAIR DISKUS) 250-50 MCG/ACT AEPB Inhale 1 puff into the lungs in the morning and at bedtime. NEEDS PASS 08/09/21   Claiborne Rigg, NP  midodrine (PROAMATINE) 10 MG tablet Take 1 tablet (10 mg total) by mouth 3 (three) times daily with meals. 09/24/22   Alfredo Martinez, MD  montelukast (SINGULAIR) 10 MG tablet Take 1 tablet (10 mg total) by mouth at bedtime. 06/07/21   Claiborne Rigg, NP  VELPHORO 500 MG chewable tablet Chew 1,000 mg by mouth 2 (two) times daily after a meal. 02/06/21   [provider]  VISINE 0.05 % ophthalmic solution Place 2-3 drops into both eyes 2 (two) times daily as needed (dry eye). 07/04/21   [provider]      Allergies    Codeine, Dextromethorphan-guaifenesin, Iron dextran, and Morphine and codeine    Review of Systems   Review of Systems  Respiratory:  Positive for shortness of breath.   All other systems reviewed and are negative.   Physical Exam Updated Vital Signs BP (!) 140/88   Pulse 72   Temp (!) 96.3 F (35.7 C) (Axillary)   Resp (!) 24   SpO2 100%   Physical Exam Vitals and nursing note reviewed.  Constitutional:      Appearance: He is well-developed.  HENT:     Head: Normocephalic and atraumatic.  Eyes:     Conjunctiva/sclera: Conjunctivae normal.     Pupils: Pupils  are equal, round, and reactive to light.  Cardiovascular:     Rate and Rhythm: Normal rate and regular rhythm.     Heart sounds: Normal heart sounds.  Pulmonary:     Effort: Pulmonary effort is normal. Tachypnea present.     Breath sounds: Normal breath sounds.     Comments: Midlly tachypneic, able to talk in phrases through Bipap mask Chest:     Comments: Dialysis catheter left chest wall Abdominal:     General: Bowel sounds are normal.     Palpations: Abdomen is soft.  Musculoskeletal:        General: Normal range of motion.     Cervical back: Normal range of motion.     Comments: Fistula LUE and right  forearm, both without thrill  Skin:    General: Skin is warm and dry.  Neurological:     Mental Status: He is alert and oriented to person, place, and time.     ED Results / Procedures / Treatments   Labs (all labs ordered are listed, but only abnormal results are displayed) Labs Reviewed  CBC WITH DIFFERENTIAL/PLATELET - Abnormal; Notable for the following components:      Result Value   RBC 3.95 (*)    Hemoglobin 10.4 (*)    HCT 35.2 (*)    MCHC 29.5 (*)    RDW 15.9 (*)    Platelets 105 (*)    All other components within normal limits  COMPREHENSIVE METABOLIC PANEL - Abnormal; Notable for the following components:   Glucose, Bld 129 (*)    BUN 50 (*)    Creatinine, Ser 8.21 (*)    Albumin 2.5 (*)    AST 184 (*)    ALT 77 (*)    GFR, Estimated 7 (*)    All other components within normal limits  RESP PANEL BY RT-PCR (RSV, FLU A&B, COVID)  [id number]    EKG None  Radiology DG Chest Port 1 View  Result Date: 11/29/2022 CLINICAL DATA:  Shortness of breath and cough EXAM: PORTABLE CHEST 1 VIEW COMPARISON:  09/18/2022 FINDINGS: Cardiac shadow is enlarged but stable. Dialysis catheter is again noted on the left. Increasing patchy airspace opacities are noted in the bases bilaterally. Chronic blunting of the costophrenic angles is seen. No large effusion is noted. No bony abnormality is seen. IMPRESSION: Increase in bibasilar airspace opacities when compared with the prior exam. Electronically Signed   By: Alcide Clever M.D.   On: 11/29/2022 02:47    Procedures Procedures    CRITICAL CARE Performed by: Garlon Hatchet   Total critical care time: 45 minutes  Critical care time was exclusive of separately billable procedures and treating other patients.  Critical care was necessary to treat or prevent imminent or life-threatening deterioration.  Critical care was time spent personally by me on the following activities: development of treatment plan with patient and/or  surrogate as well as nursing, discussions with consultants, evaluation of patient's response to treatment, examination of patient, obtaining history from patient or surrogate, ordering and performing treatments and interventions, ordering and review of laboratory studies, ordering and review of radiographic studies, pulse oximetry and re-evaluation of patient's condition.   Medications Ordered in ED Medications - No data to display  ED Course/ Medical Decision Making/ A&P                                 Medical Decision Making  Amount and/or Complexity of Data Reviewed Labs: ordered. Radiology: ordered and independent interpretation performed. ECG/medicine tests: ordered and independent interpretation performed.  Risk Decision regarding hospitalization.   62 year old male presenting to the ED with respiratory distress.  Found outside of his house with respiratory distress, sats of 80.  He is not generally oxygen dependent.  He was given magnesium, Solu-Medrol, DuoNebs, started on CPAP with some improvement but sats still marginal at 90% on arrival.  Does have some mild retractions but overall seems to be much improved.  Has not missed any HD sessions, full treatment on Tuesday.  Due again this morning.  Will check labs, chest x-ray.  Switched to BiPAP and tolerating well.  Will monitor.  Labs as above--appear at baseline.  Potassium is normal.  Chest x-ray with increased opacities from prior.  Patient continues tolerating BiPAP well.  His saturations have improved and no longer distressed.  Will admit.  Will need regularly scheduled dialysis this AM.  Discussed with hospitalist on Dr. Lazarus Salines-- requested to add RVP, likely AM team to eval/admit.  Nephrology paged to assist with arranging dialysis but call not returned as of end of shift.  Final Clinical Impression(s) / ED Diagnoses Final diagnoses:  COPD exacerbation (HCC)  ESRD (end stage renal disease) on dialysis [facility])    Rx / DC  Orders ED Discharge Orders     None         Garlon Hatchet, PA-C 11/29/22 [id number]    Shon Baton, MD 11/30/22 0005

## 2022-11-29 NOTE — Plan of Care (Signed)
  Problem: Nutritional: Goal: Maintenance of adequate nutrition will improve Outcome: Progressing   Problem: Skin Integrity: Goal: Risk for impaired skin integrity will decrease Outcome: Progressing   Problem: Clinical Measurements: Goal: Will remain free from infection Outcome: Progressing

## 2022-11-29 NOTE — Consult Note (Signed)
Reason for Consult: ESRD Referring Physician:  Dr. Skip Mayer  Chief Complaint: Shortness of breath  Dialysis Orders: TTS SW 4:15hr, 450/600, EDW 68.5 kg, 2K/2Ca bath, TDC, no heparin - last OP HD 9/05, post wt 70.6 kg - coming off 2-4kg over for last 3 wks - parsabiv 7.5mg  IV three times per week - Mircera 100 q2weeks (last given 11/12)   Home meds include - albuterol, amiodarone, eliquis, advair diskus, midodrine 5mg  1- 2 on non-hd days, singulair, velphoro 1 gm ac tid, prns   Assessment/Plan: ESRD: Continue HD on usual TTS schedule; pt appears to be comfortable. Will write orders for 1st shift tomorrow AM. HypoTN/ volume: No edema on exam - BP stable, was using midodrine pre-HD at home -> sometimes requires 10mg  TID. Anemia of ESRD: Hgb 10.4 last Mircera on 11/12. Secondary HPTH: CorrCa high, Phos fine. On Velphoro as binder. Resume Parsabiv as outpatient (not formulary here).  Hx CVA with residual left sided weakness: On Eliquis  H/o penile infection/sepsis: in 09/2022 seen by urology requiring debridement with biopsy on 9/10. 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 Blood tests confirm. IM PCN weekly x 3 for T. pallidium. Nutrition: Alb very low, continue supplements   HPI: Nathaniel White is an 62 y.o. male with  HFrEF, pAFib, VT, mitral regurgitation, ESRD on HD TTS, HTN, H/O CVA, restrictive lung dx, COPD, anemia of chronic dx, DM2, pulmonary hypertension, cirrhosis, hx of CVA,who presents with complaint of acute sob x 2 hours. In the field per chart patient was found to have Sats of 80% . Patient was treated with 2 gram mag, duoneb , albuterol and 125 mg solumedrol , and placed on CPAP prior to arrival in ED. Of note the patient complains of SOB for 2 hours acutely but denied chills, n/v//d/ abdominal pain, chest pain, presyncope  or fever.    ROS Pertinent items are noted in HPI. No BM for >24hrs.  Chemistry  and CBC: Creatinine  Date/Time Value Ref Range Status  01/17/2015 02:54 PM 9.9 (HH) 0.7 - 1.3 mg/dL Final   Creatinine, Ser  Date/Time Value Ref Range Status  11/29/2022 12:21 PM 8.60 (H) 0.61 - 1.24 mg/dL Final  16/10/9602 54:09 AM 8.21 (H) 0.61 - 1.24 mg/dL Final  81/19/1478 29:56 AM 7.25 (H) 0.61 - 1.24 mg/dL Final  21/30/8657 84:69 AM 5.09 (H) 0.61 - 1.24 mg/dL Final  62/95/2841 32:44 AM 7.04 (H) 0.61 - 1.24 mg/dL Final  01/10/7251 66:44 PM 5.95 (H) 0.61 - 1.24 mg/dL Final  03/47/4259 56:38 AM 6.29 (H) 0.61 - 1.24 mg/dL Final  75/64/3329 51:88 AM 4.50 (H) 0.61 - 1.24 mg/dL Final  41/66/0630 16:01 AM 5.04 (H) 0.61 - 1.24 mg/dL Final  09/32/3557 32:20 AM 4.99 (H) 0.61 - 1.24 mg/dL Final  25/42/7062 37:62 AM 6.22 (H) 0.61 - 1.24 mg/dL Final  83/15/1761 60:73 AM 6.33 (H) 0.61 - 1.24 mg/dL Final  71/06/2692 85:46 AM 4.95 (H) 0.61 - 1.24 mg/dL Final  27/03/5007 38:18 AM 9.50 (H) 0.61 - 1.24 mg/dL Final  29/93/7169 67:89 AM 9.40 (H) 0.61 - 1.24 mg/dL Final  38/10/1749 02:58 PM 7.60 (H) 0.61 - 1.24 mg/dL Final  52/77/8242 35:36 AM 8.67 (H) 0.61 - 1.24 mg/dL Final  14/43/1540 08:67 AM 8.83 (H) 0.61 - 1.24 mg/dL Final  61/95/0932 67:12 AM 6.39 (H) 0.61 - 1.24 mg/dL Final  45/80/9983 38:25 AM 7.99 (H) 0.61 - 1.24 mg/dL Final  05/39/7673 41:93 AM 4.77 (H) 0.61 - 1.24  mg/dL Final  21/30/8657 84:69 AM 3.71 (H) 0.61 - 1.24 mg/dL Final  62/95/2841 32:44 AM 3.62 (H) 0.61 - 1.24 mg/dL Final  01/10/7251 66:44 PM 4.29 (H) 0.61 - 1.24 mg/dL Final  03/47/4259 56:38 AM 4.69 (H) 0.61 - 1.24 mg/dL Final    Comment:    DELTA CHECK NOTED  06/20/2022 02:00 AM 9.92 (H) 0.61 - 1.24 mg/dL Final  75/64/3329 51:88 PM 8.66 (H) 0.61 - 1.24 mg/dL Final  41/66/0630 16:01 PM 10.32 (H) 0.61 - 1.24 mg/dL Final  09/32/3557 32:20 PM QUANTITY NOT SUFFICIENT, UNABLE TO PERFORM TEST 0.61 - 1.24 mg/dL Final  25/42/7062 37:62 PM 7.28 (H) 0.61 - 1.24 mg/dL Final  83/15/1761 60:73 AM 4.15 (H) 0.61 - 1.24 mg/dL Final   71/06/2692 85:46 AM 5.73 (H) 0.61 - 1.24 mg/dL Final  27/03/5007 38:18 AM 7.19 (H) 0.61 - 1.24 mg/dL Final  29/93/7169 67:89 AM 8.21 (H) 0.61 - 1.24 mg/dL Final  38/10/1749 02:58 PM 8.77 (H) 0.61 - 1.24 mg/dL Final  52/77/8242 35:36 AM 6.46 (H) 0.61 - 1.24 mg/dL Final  14/43/1540 08:67 AM 9.87 (H) 0.61 - 1.24 mg/dL Final  61/95/0932 67:12 AM 9.39 (H) 0.61 - 1.24 mg/dL Final  45/80/9983 38:25 AM 7.51 (H) 0.61 - 1.24 mg/dL Final    Comment:    DIALYSIS  09/05/2021 08:25 AM 11.54 (H) 0.61 - 1.24 mg/dL Final  05/39/7673 41:93 AM 9.38 (H) 0.61 - 1.24 mg/dL Final  79/02/4095 35:32 AM 10.97 (HH) 0.61 - 1.24 mg/dL Final    Comment:    REPEATED TO VERIFY CRITICAL RESULT CALLED TO, READ BACK BY AND VERIFIED WITH: CRITICAL CALLED TO S. CROFT MT AT 1114 ON [id number] BY SROY   06/26/2021 02:42 AM 7.97 (H) 0.61 - 1.24 mg/dL Final  83/41/9622 29:79 AM 5.50 (H) 0.61 - 1.24 mg/dL Final  89/21/1941 74:08 AM 7.41 (H) 0.61 - 1.24 mg/dL Final  14/48/1856 31:49 AM 10.55 (H) 0.61 - 1.24 mg/dL Final  70/26/3785 88:50 AM 9.36 (H) 0.61 - 1.24 mg/dL Final  27/74/1287 86:76 AM 6.86 (H) 0.61 - 1.24 mg/dL Final  72/09/4707 62:83 PM 6.04 (H) 0.61 - 1.24 mg/dL Final    Comment:    DELTA CHECK NOTED  06/20/2021 10:24 AM 11.29 (H) 0.61 - 1.24 mg/dL Final  66/29/4765 46:50 AM 12.70 (H) 0.61 - 1.24 mg/dL Final  35/46/5681 27:51 AM 11.50 (H) 0.61 - 1.24 mg/dL Final   Recent Labs  Lab 11/29/22 0332 11/29/22 1221  NA 137 136  K 3.6 4.1  CL 99 98  CO2 24 21*  GLUCOSE 129* 148*  BUN 50* 57*  CREATININE 8.21* 8.60*  CALCIUM 9.5 9.8   Recent Labs  Lab 11/29/22 0332 11/29/22 1221  WBC 5.5 2.4*  NEUTROABS 3.4 2.2  HGB 10.4* 10.3*  HCT 35.2* 33.4*  MCV 89.1 87.4  PLT 105* 104*   Liver Function Tests: Recent Labs  Lab 11/29/22 0332 11/29/22 1221  AST 184* 203*  ALT 77* 88*  ALKPHOS 93 88  BILITOT 0.8 0.6  PROT 7.8 8.3*  ALBUMIN 2.5* 2.5*   No results for input(s): "LIPASE", "AMYLASE" in the last  168 hours. No results for input(s): "AMMONIA" in the last 168 hours. Cardiac Enzymes: No results for input(s): "CKTOTAL", "CKMB", "CKMBINDEX", "TROPONINI" in the last 168 hours. Iron Studies: No results for input(s): "IRON", "TIBC", "TRANSFERRIN", "FERRITIN" in the last 72 hours. PT/INR: @LABRCNTIP (inr:5)  Xrays/Other Studies: ) Results for orders placed or performed during the hospital encounter of 11/29/22 (from the past 48 hour(s))  CBC with Differential     Status: Abnormal   Collection Time: 11/29/22  3:32 AM  Result Value Ref Range   WBC 5.5 4.0 - 10.5 K/uL   RBC 3.95 (L) 4.22 - 5.81 MIL/uL   Hemoglobin 10.4 (L) 13.0 - 17.0 g/dL   HCT 40.9 (L) 81.1 - 91.4 %   MCV 89.1 80.0 - 100.0 fL   MCH 26.3 26.0 - 34.0 pg   MCHC 29.5 (L) 30.0 - 36.0 g/dL   RDW 78.2 (H) 95.6 - 21.3 %   Platelets 105 (L) 150 - 400 K/uL   nRBC 0.0 0.0 - 0.2 %   Neutrophils Relative % 62 %   Neutro Abs 3.4 1.7 - 7.7 K/uL   Lymphocytes Relative 22 %   Lymphs Abs 1.2 0.7 - 4.0 K/uL   Monocytes Relative 9 %   Monocytes Absolute 0.5 0.1 - 1.0 K/uL   Eosinophils Relative 6 %   Eosinophils Absolute 0.3 0.0 - 0.5 K/uL   Basophils Relative 1 %   Basophils Absolute 0.1 0.0 - 0.1 K/uL   Immature Granulocytes 0 %   Abs Immature Granulocytes 0.02 0.00 - 0.07 K/uL    Comment: Performed at [facility], [address], [place], [place] [address]  Comprehensive metabolic panel     Status: Abnormal   Collection Time: 11/29/22  3:32 AM  Result Value Ref Range   Sodium 137 135 - 145 mmol/L   Potassium 3.6 3.5 - 5.1 mmol/L   Chloride 99 98 - 111 mmol/L   CO2 24 22 - 32 mmol/L   Glucose, Bld 129 (H) 70 - 99 mg/dL    Comment: Glucose reference range applies only to samples taken after fasting for at least 8 hours.   BUN 50 (H) 6 - 20 mg/dL   Creatinine, Ser 8.46 (H) 0.61 - 1.24 mg/dL   Calcium 9.5 8.9 - 96.2 mg/dL   Total Protein 7.8 6.5 - 8.1 g/dL   Albumin 2.5 (L) 3.5 - 5.0 g/dL   AST 952 (H) 15 - 41 U/L    ALT 77 (H) 0 - 44 U/L   Alkaline Phosphatase 93 38 - 126 U/L   Total Bilirubin 0.8 <1.2 mg/dL   GFR, Estimated 7 (L) >60 mL/min    Comment: (NOTE) Calculated using the CKD-EPI Creatinine Equation (2021)    Anion gap 14 5 - 15    Comment: Performed at [facility], [address], [place], [place] [address]  Resp panel by RT-PCR (RSV, Flu A&B, Covid) Anterior Nasal Swab     Status: None   Collection Time: 11/29/22  5:44 AM   Specimen: Anterior Nasal Swab  Result Value Ref Range   SARS Coronavirus 2 by RT PCR NEGATIVE NEGATIVE   Influenza A by PCR NEGATIVE NEGATIVE   Influenza B by PCR NEGATIVE NEGATIVE    Comment: (NOTE) The Xpert Xpress SARS-CoV-2/FLU/RSV plus assay is intended as an aid in the diagnosis of influenza from Nasopharyngeal swab specimens and should not be used as a sole basis for treatment. Nasal washings and aspirates are unacceptable for Xpert Xpress SARS-CoV-2/FLU/RSV testing.  Fact Sheet for Patients: BloggerCourse.com  Fact Sheet for Healthcare Providers: SeriousBroker.it  This test is not yet approved or cleared by the [place] FDA and has been authorized for detection and/or diagnosis of SARS-CoV-2 by FDA under an Emergency Use Authorization (EUA). This EUA will remain in effect (meaning this test can be used) for the duration of the COVID-19  declaration under Section 564(b)(1) of the Act, 21 U.S.C. section 360bbb-3(b)(1), unless the authorization is terminated or revoked.     Resp Syncytial Virus by PCR NEGATIVE NEGATIVE    Comment: (NOTE) Fact Sheet for Patients: BloggerCourse.com  Fact Sheet for Healthcare Providers: SeriousBroker.it  This test is not yet approved or cleared by the [place] FDA and has been authorized for detection and/or diagnosis of SARS-CoV-2 by FDA under an Emergency Use Authorization (EUA). This EUA will  remain in effect (meaning this test can be used) for the duration of the COVID-19 declaration under Section 564(b)(1) of the Act, 21 U.S.C. section 360bbb-3(b)(1), unless the authorization is terminated or revoked.  Performed at [facility], [address] [place], [place] [address]   MRSA Next Gen by PCR, Nasal     Status: None   Collection Time: 11/29/22 11:42 AM   Specimen: Nasal Mucosa; Nasal Swab  Result Value Ref Range   MRSA by PCR Next Gen NOT DETECTED NOT DETECTED    Comment: (NOTE) The GeneXpert MRSA Assay (FDA approved for NASAL specimens only), is one component of a comprehensive MRSA colonization surveillance program. It is not intended to diagnose MRSA infection nor to guide or monitor treatment for MRSA infections. Test performance is not FDA approved in patients less than 1 years old. Performed at [facility], [address] [place], [place] [address]   Hemoglobin A1c     Status: None   Collection Time: 11/29/22 12:21 PM  Result Value Ref Range   Hgb A1c MFr Bld 4.8 4.8 - 5.6 %    Comment: (NOTE) Pre diabetes:          5.7%-6.4%  Diabetes:              >6.4%  Glycemic control for   <7.0% adults with diabetes    Mean Plasma Glucose 91.06 mg/dL    Comment: Performed at [facility], [address], [place], [place] [address]  Comprehensive metabolic panel     Status: Abnormal   Collection Time: 11/29/22 12:21 PM  Result Value Ref Range   Sodium 136 135 - 145 mmol/L   Potassium 4.1 3.5 - 5.1 mmol/L   Chloride 98 98 - 111 mmol/L   CO2 21 (L) 22 - 32 mmol/L   Glucose, Bld 148 (H) 70 - 99 mg/dL    Comment: Glucose reference range applies only to samples taken after fasting for at least 8 hours.   BUN 57 (H) 6 - 20 mg/dL   Creatinine, Ser 4.62 (H) 0.61 - 1.24 mg/dL   Calcium 9.8 8.9 - 70.3 mg/dL   Total Protein 8.3 (H) 6.5 - 8.1 g/dL   Albumin 2.5 (L) 3.5 - 5.0 g/dL   AST 500 (H) 15 - 41 U/L   ALT 88 (H) 0 - 44 U/L   Alkaline  Phosphatase 88 38 - 126 U/L   Total Bilirubin 0.6 <1.2 mg/dL   GFR, Estimated 7 (L) >60 mL/min    Comment: (NOTE) Calculated using the CKD-EPI Creatinine Equation (2021)    Anion gap 17 (H) 5 - 15    Comment: Performed at [facility], [address], [place], [place] [address]  CBC with Differential/Platelet     Status: Abnormal   Collection Time: 11/29/22 12:21 PM  Result Value Ref Range   WBC 2.4 (L) 4.0 - 10.5 K/uL   RBC 3.82 (L) 4.22 - 5.81 MIL/uL   Hemoglobin 10.3 (L) 13.0 -  17.0 g/dL   HCT 14.7 (L) 82.9 - 56.2 %   MCV 87.4 80.0 - 100.0 fL   MCH 27.0 26.0 - 34.0 pg   MCHC 30.8 30.0 - 36.0 g/dL   RDW 13.0 (H) 86.5 - 78.4 %   Platelets 104 (L) 150 - 400 K/uL    Comment: REPEATED TO VERIFY   nRBC 0.0 0.0 - 0.2 %   Neutrophils Relative % 91 %   Neutro Abs 2.2 1.7 - 7.7 K/uL   Lymphocytes Relative 4 %   Lymphs Abs 0.1 (L) 0.7 - 4.0 K/uL   Monocytes Relative 1 %   Monocytes Absolute 0.0 (L) 0.1 - 1.0 K/uL   Eosinophils Relative 2 %   Eosinophils Absolute 0.0 0.0 - 0.5 K/uL   Basophils Relative 0 %   Basophils Absolute 0.0 0.0 - 0.1 K/uL   nRBC 0 0 /100 WBC   Myelocytes 2 %   Abs Immature Granulocytes 0.00 0.00 - 0.07 K/uL   Polychromasia PRESENT     Comment: Performed at [facility], [address] [place], [place] [address]  CBG monitoring, ED     Status: Abnormal   Collection Time: 11/29/22  2:08 PM  Result Value Ref Range   Glucose-Capillary 129 (H) 70 - 99 mg/dL    Comment: Glucose reference range applies only to samples taken after fasting for at least 8 hours.   DG Chest Port 1 View  Result Date: 11/29/2022 CLINICAL DATA:  Shortness of breath and cough EXAM: PORTABLE CHEST 1 VIEW COMPARISON:  09/18/2022 FINDINGS: Cardiac shadow is enlarged but stable. Dialysis catheter is again noted on the left. Increasing patchy airspace opacities are noted in the bases bilaterally. Chronic blunting of the costophrenic angles is seen. No large effusion is noted. No  bony abnormality is seen. IMPRESSION: Increase in bibasilar airspace opacities when compared with the prior exam. Electronically Signed   By: Alcide Clever M.D.   On: 11/29/2022 02:47    PMH:   Past Medical History:  Diagnosis Date   Anemia    Anxiety    Asthma    Complication from renal dialysis device 11/02/2013   Complication of anesthesia 2017   according to pt and spouse pt was moving around and cough while under and pt had difficulty waking up so the anesthesia had to be reversed. and pt admitted to ICU.    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/2008   Qualifier: Diagnosis of  By: Russella Dar MD Marylu Lund    ESRD    on HD, T-TH-Sat - [place]   Hemiparesis due to old stroke [facility])    left   HFrEF (heart failure with reduced ejection fraction) (HCC)    NICM // TTE 06/2022: EF 25-30, sever RV dysfunction, mod to severe MR   Hypertension    Hx, not current problems, no meds   LV dysfunction    EF 25-30% by echo 07/2011   Memory loss due to medical condition    due to stroke   MR (mitral regurgitation)    moderate to severe, echo 07/2011   Nausea with vomiting, unspecified 06/15/2021   Paroxysmal atrial fibrillation (HCC)    Peripheral vascular disease (HCC)    Shortness of breath  with exertion   Stroke [facility])    TIA's-left sided weakness   Tobacco abuse     PSH:   Past Surgical History:  Procedure Laterality Date   ABDOMINAL AORTOGRAM W/LOWER EXTREMITY Bilateral 07/21/2018   Procedure: ABDOMINAL AORTOGRAM W/LOWER EXTREMITY;  Surgeon: Maeola Harman, MD;  Location: [facility] INVASIVE CV LAB;  Service: Cardiovascular;  Laterality: Bilateral;   AMPUTATION Left 11/05/2018   Procedure: AMPUTATION SECOND TOE LEFT FOOT;  Surgeon: Maeola Harman, MD;  Location: [facility] OR;  Service: Vascular;  Laterality: Left;   AMPUTATION Left 02/13/2021   Procedure: Left small finger AMPUTATION DIGIT;  Surgeon: Marlyne Beards, MD;  Location: [facility] OR;  Service: Orthopedics;  Laterality: Left;   AV FISTULA PLACEMENT     CARDIAC CATHETERIZATION     [facility]   COLONOSCOPY W/ BIOPSIES AND POLYPECTOMY     FISTULA SUPERFICIALIZATION Left 11/10/2013   Procedure: FISTULA PLICATION;  Surgeon: Nada Libman, MD;  Location: [facility] OR;  Service: Vascular;  Laterality: Left;   INSERTION OF DIALYSIS CATHETER N/A 06/20/2021   Procedure: INSERTION OF DIALYSIS CATHETER;  Surgeon: Victorino Sparrow, MD;  Location: [facility] OR;  Service: Vascular;  Laterality: N/A;   KIDNEY TRANSPLANT     2011 rejected kidney 2012 back on dialysis   LEFT AND RIGHT HEART CATHETERIZATION WITH CORONARY ANGIOGRAM N/A 10/09/2013   Procedure: LEFT AND RIGHT HEART CATHETERIZATION WITH CORONARY ANGIOGRAM;  Surgeon: Kathleene Hazel, MD;  Location: [facility] CATH LAB;  Service: Cardiovascular;  Laterality: N/A;   LIGATION OF ARTERIOVENOUS  FISTULA Left 06/20/2021   Procedure: LIGATION OF ARTERIOVENOUS  FISTULA;  Surgeon: Victorino Sparrow, MD;  Location: [facility] OR;  Service: Vascular;  Laterality: Left;   PERIPHERAL VASCULAR INTERVENTION Left 07/21/2018   Procedure: PERIPHERAL VASCULAR INTERVENTION;  Surgeon: Maeola Harman, MD;  Location: [facility] INVASIVE CV LAB;  Service: Cardiovascular;  Laterality: Left;  Popliteal   REVISON OF ARTERIOVENOUS FISTULA Left 08/26/2013   Procedure: EXCISION OF ERODED SKIN AND EXPLORATION OF MAIN LEFT UPPER ARM AV FISTULA;  Surgeon: Larina Earthly, MD;  Location: [facility] OR;  Service: Vascular;  Laterality: Left;   REVISON OF ARTERIOVENOUS FISTULA Left 02/05/2014   Procedure: REPAIR OF ARTERIOVENOUS FISTULA ANEURYSM;  Surgeon: Nada Libman, MD;  Location: [facility] OR;  Service: Vascular;  Laterality: Left;   REVISON OF ARTERIOVENOUS FISTULA Left 03/10/2021   Procedure: LEFT  ARM REVISION OF ARTERIOVENOUS FISTULA WITH PLICATION;  Surgeon: Maeola Harman, MD;  Location: [facility] OR;  Service: Vascular;  Laterality: Left;   RIGHT/LEFT HEART CATH AND CORONARY ANGIOGRAPHY N/A 06/21/2022   Procedure: RIGHT/LEFT HEART CATH AND CORONARY ANGIOGRAPHY;  Surgeon: Lennette Bihari, MD;  Location: [facility] INVASIVE CV LAB;  Service: Cardiovascular;  Laterality: N/A;   SCROTAL EXPLORATION N/A 09/16/2022   Procedure: PENILE DEBRIDMENT;  Surgeon: Rene Paci, MD;  Location: [facility] OR;  Service: Urology;  Laterality: N/A;   SHUNTOGRAM N/A 11/05/2012   Procedure: Fistulogram;  Surgeon: Nada Libman, MD;  Location: [facility] CATH LAB;  Service: Cardiovascular;  Laterality: N/A;   TEE WITHOUT CARDIOVERSION  08/17/2011   Procedure: TRANSESOPHAGEAL ECHOCARDIOGRAM (TEE);  Surgeon: Laurey Morale, MD;  Location: [facility] ENDOSCOPY;  Service: Cardiovascular;  Laterality: N/A;   ULTRASOUND GUIDANCE FOR VASCULAR ACCESS  06/20/2021   Procedure: ULTRASOUND GUIDANCE FOR VASCULAR ACCESS;  Surgeon: Victorino Sparrow, MD;  Location: [facility] OR;  Service: Vascular;;   VIDEO ASSISTED THORACOSCOPY (VATS)/DECORTICATION  08/10/11    Allergies:  Allergies  Allergen Reactions  Codeine Shortness Of Breath   Dextromethorphan-Guaifenesin Shortness Of Breath and Other (See Comments)   Iron Dextran Shortness Of Breath and Other (See Comments)   Morphine And Codeine Shortness Of Breath    Other Reaction(s): Other (See Comments)    Medications:   Prior to Admission medications   Medication Sig Start Date End Date Taking? Authorizing Provider  amiodarone (PACERONE) 200 MG tablet Take 1 tablet (200 mg total) by mouth daily. 09/25/22  Yes Pray, Milus Mallick, MD  ELIQUIS 5 MG TABS tablet Take 5 mg by mouth 2 (two) times daily. 12/09/21  Yes [provider]  midodrine (PROAMATINE) 10 MG tablet Take 1 tablet (10 mg total) by mouth 3 (three) times daily with meals. Patient taking differently: Take 10 mg by mouth See  admin instructions. TAKE 1 TABLET BY MOUTH THREE TIMES A WEEK. TAKE 30 MINUTES BEFORE HEMODIALYSIS. TAKE 1 EXTRA TABLET MID TREATMENT 09/24/22  Yes Alfredo Martinez, MD  midodrine (PROAMATINE) 5 MG tablet Take 5 mg by mouth See admin instructions. Take 1 tablet by mouth three times a day on Mon, Wed, Fri (non-dialysis days) 10/28/22  Yes [provider]  Tenapanor HCl, CKD, (XPHOZAH) 30 MG TABS Take 30 mg by mouth daily.   Yes [provider]  traZODone (DESYREL) 50 MG tablet Take 50 mg by mouth at bedtime as needed for sleep.   Yes [provider]  VELPHORO 500 MG chewable tablet Chew 1,000 mg by mouth 2 (two) times daily after a meal. 02/06/21  Yes [provider]  VISINE 0.05 % ophthalmic solution Place 2-3 drops into both eyes 2 (two) times daily as needed (dry eye). 07/04/21  Yes [provider]  acetaminophen (TYLENOL) 500 MG tablet Take 1 tablet (500 mg total) by mouth every 6 (six) hours as needed. Patient taking differently: Take 500 mg by mouth every 6 (six) hours as needed for mild pain (pain score 1-3). 04/11/18   Georgetta Haber, NP  albuterol (PROAIR HFA) 108 (90 Base) MCG/ACT inhaler Inhale 2 puffs into the lungs every 6 (six) hours as needed for wheezing or shortness of breath. Patient not taking: Reported on 11/29/2022 08/25/21   Hoy Register, MD  albuterol (PROVENTIL) (2.5 MG/3ML) 0.083% nebulizer solution Take 3 mLs (2.5 mg total) by nebulization every 6 (six) hours as needed for wheezing or shortness of breath. Patient not taking: Reported on 11/29/2022 08/25/21   Hoy Register, MD  fluticasone-salmeterol (ADVAIR DISKUS) 250-50 MCG/ACT AEPB Inhale 1 puff into the lungs in the morning and at bedtime. NEEDS PASS Patient not taking: Reported on 11/29/2022 08/09/21   Claiborne Rigg, NP    Discontinued Meds:   Medications Discontinued During This Encounter  Medication Reason   montelukast (SINGULAIR) 10 MG tablet Patient Preference    acetaminophen (TYLENOL) tablet 500 mg    albuterol (PROVENTIL) (2.5 MG/3ML) 0.083% nebulizer solution 2.5 mg    levalbuterol (XOPENEX) nebulizer solution 0.63 mg    ceFEPIme (MAXIPIME) 2 g in sodium chloride 0.9 % 100 mL IVPB     Social History:  reports that he quit smoking about 10 years ago. His smoking use included cigarettes. He started smoking about 25 years ago. He has a 7.5 pack-year smoking history. He has never used smokeless tobacco. He reports that he does not drink alcohol and does not use drugs.  Family History:   Family History  Problem Relation Age of Onset   Hypertension Mother    Varicose Veins Mother    Diabetes  Paternal Grandmother    Cancer Paternal Grandfather    CAD Paternal Uncle     Blood pressure (!) 99/55, pulse 65, temperature 97.7 F (36.5 C), temperature source Oral, resp. rate (!) 21, height 5\' 9"  (1.753 m), weight 73 kg, SpO2 98%. General: Chronically ill appearing man, NAD. Room air Heart: RRR; no murmur Lungs: CTAB; no rales Abdomen: soft GU: Penile wound not examined Extremities: no LE edema Dialysis Access: TDC in L chest, clotted rt FAL and LUA access as well.       Ethelene Hal, MD 11/29/2022, 7:58 PM

## 2022-11-29 NOTE — H&P (Signed)
History and Physical    Nathaniel White WNU:[id number] DOB: 1960/06/03 DOA: 11/29/2022  PCP: Claiborne Rigg, NP  Patient coming from: home  I have personally briefly reviewed patient's old medical records in [facility]  Chief Complaint: SOB  HPI: Nathaniel White is a 62 y.o. male with medical history significant of  HFrEF, pAFib, VT, mitral regurgitation, ESRD on HDTTS, HTN, H/O CVA, restrictive lung dx, COPD, anemia of chronic dx, DM2, pulmonary hypertension ,cirrhosis, hx of CVA,who presents to ED BIBEMS with complaint of acute sob x 2 hours. In the field per chart patient was found to have Sats of 80% . Patient was treated with 2 gram mag, duoneb , albuterol and 125 mg solumedrol , and placed on CPAP prior to arrival in ED. Patient notes acute onset,notes no associated chills, n/v//d/ abdominal pain, chest pain, presyncope  or fever.  He does endorse constipation.  ED Course:  Afeb,hr 71, rr 15 bp 140/88, 100% on cpap EKG:snr, LBBB,  Labs: wbc:5.5, hgb 10.4,plt 105 Na 137, K 3.6, gl99, gly 129, cr 8.21 Resp panel:neg Cxr: IMPRESSION: Increase in bibasilar airspace opacities when compared with the prior exam.   Review of Systems: As per HPI otherwise 10 point review of systems negative.   Past Medical History:  Diagnosis Date   Anemia    Anxiety    Asthma    Complication from renal dialysis device 11/02/2013   Complication of anesthesia 2017   according to pt and spouse pt was moving around and cough while under and pt had difficulty waking up so the anesthesia had to be reversed. and pt admitted to ICU.    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/2008   Qualifier: Diagnosis of  By: Russella Dar MD Marylu Lund    ESRD    on HD, T-TH-Sat - [place]    Hemiparesis due to old stroke [facility])    left   HFrEF (heart failure with reduced ejection fraction) (HCC)    NICM // TTE 06/2022: EF 25-30, sever RV dysfunction, mod to severe MR   Hypertension    Hx, not current problems, no meds   LV dysfunction    EF 25-30% by echo 07/2011   Memory loss due to medical condition    due to stroke   MR (mitral regurgitation)    moderate to severe, echo 07/2011   Nausea with vomiting, unspecified 06/15/2021   Paroxysmal atrial fibrillation (HCC)    Peripheral vascular disease (HCC)    Shortness of breath    with exertion   Stroke (HCC)    TIA's-left sided weakness   Tobacco abuse     Past Surgical History:  Procedure Laterality Date   ABDOMINAL AORTOGRAM W/LOWER EXTREMITY Bilateral 07/21/2018   Procedure: ABDOMINAL AORTOGRAM W/LOWER EXTREMITY;  Surgeon: Maeola Harman, MD;  Location: [facility] INVASIVE CV LAB;  Service: Cardiovascular;  Laterality: Bilateral;   AMPUTATION Left 11/05/2018   Procedure: AMPUTATION SECOND TOE LEFT FOOT;  Surgeon: Maeola Harman, MD;  Location: [facility] OR;  Service: Vascular;  Laterality: Left;   AMPUTATION Left 02/13/2021   Procedure: Left small finger AMPUTATION DIGIT;  Surgeon: Marlyne Beards, MD;  Location: [facility] OR;  Service: Orthopedics;  Laterality: Left;   AV FISTULA PLACEMENT  CARDIAC CATHETERIZATION     [facility]   COLONOSCOPY W/ BIOPSIES AND POLYPECTOMY     FISTULA SUPERFICIALIZATION Left 11/10/2013   Procedure: FISTULA PLICATION;  Surgeon: Nada Libman, MD;  Location: [facility] OR;  Service: Vascular;  Laterality: Left;   INSERTION OF DIALYSIS CATHETER N/A 06/20/2021   Procedure: INSERTION OF DIALYSIS CATHETER;  Surgeon: Victorino Sparrow, MD;  Location: [facility] OR;  Service: Vascular;  Laterality: N/A;   KIDNEY TRANSPLANT     2011 rejected kidney 2012 back on dialysis   LEFT AND RIGHT HEART CATHETERIZATION WITH CORONARY ANGIOGRAM N/A 10/09/2013   Procedure: LEFT AND RIGHT HEART CATHETERIZATION WITH  CORONARY ANGIOGRAM;  Surgeon: Kathleene Hazel, MD;  Location: [facility] CATH LAB;  Service: Cardiovascular;  Laterality: N/A;   LIGATION OF ARTERIOVENOUS  FISTULA Left 06/20/2021   Procedure: LIGATION OF ARTERIOVENOUS  FISTULA;  Surgeon: Victorino Sparrow, MD;  Location: [facility] OR;  Service: Vascular;  Laterality: Left;   PERIPHERAL VASCULAR INTERVENTION Left 07/21/2018   Procedure: PERIPHERAL VASCULAR INTERVENTION;  Surgeon: Maeola Harman, MD;  Location: [facility] INVASIVE CV LAB;  Service: Cardiovascular;  Laterality: Left;  Popliteal   REVISON OF ARTERIOVENOUS FISTULA Left 08/26/2013   Procedure: EXCISION OF ERODED SKIN AND EXPLORATION OF MAIN LEFT UPPER ARM AV FISTULA;  Surgeon: Larina Earthly, MD;  Location: [facility] OR;  Service: Vascular;  Laterality: Left;   REVISON OF ARTERIOVENOUS FISTULA Left 02/05/2014   Procedure: REPAIR OF ARTERIOVENOUS FISTULA ANEURYSM;  Surgeon: Nada Libman, MD;  Location: [facility] OR;  Service: Vascular;  Laterality: Left;   REVISON OF ARTERIOVENOUS FISTULA Left 03/10/2021   Procedure: LEFT ARM REVISION OF ARTERIOVENOUS FISTULA WITH PLICATION;  Surgeon: Maeola Harman, MD;  Location: [facility] OR;  Service: Vascular;  Laterality: Left;   RIGHT/LEFT HEART CATH AND CORONARY ANGIOGRAPHY N/A 06/21/2022   Procedure: RIGHT/LEFT HEART CATH AND CORONARY ANGIOGRAPHY;  Surgeon: Lennette Bihari, MD;  Location: [facility] INVASIVE CV LAB;  Service: Cardiovascular;  Laterality: N/A;   SCROTAL EXPLORATION N/A 09/16/2022   Procedure: PENILE DEBRIDMENT;  Surgeon: Rene Paci, MD;  Location: [facility] OR;  Service: Urology;  Laterality: N/A;   SHUNTOGRAM N/A 11/05/2012   Procedure: Fistulogram;  Surgeon: Nada Libman, MD;  Location: [facility] CATH LAB;  Service: Cardiovascular;  Laterality: N/A;   TEE WITHOUT CARDIOVERSION  08/17/2011   Procedure: TRANSESOPHAGEAL ECHOCARDIOGRAM (TEE);  Surgeon: Laurey Morale, MD;  Location: [facility] ENDOSCOPY;  Service: Cardiovascular;  Laterality: N/A;   ULTRASOUND  GUIDANCE FOR VASCULAR ACCESS  06/20/2021   Procedure: ULTRASOUND GUIDANCE FOR VASCULAR ACCESS;  Surgeon: Victorino Sparrow, MD;  Location: [facility] OR;  Service: Vascular;;   VIDEO ASSISTED THORACOSCOPY (VATS)/DECORTICATION  08/10/11     reports that he quit smoking about 10 years ago. His smoking use included cigarettes. He started smoking about 25 years ago. He has a 7.5 pack-year smoking history. He has never used smokeless tobacco. He reports that he does not drink alcohol and does not use drugs.  Allergies  Allergen Reactions   Codeine Shortness Of Breath   Dextromethorphan-Guaifenesin Shortness Of Breath and Other (See Comments)   Iron Dextran Shortness Of Breath and Other (See Comments)   Morphine And Codeine Shortness Of Breath    Other Reaction(s): Other (See Comments)    Family History  Problem Relation Age of Onset   Hypertension Mother    Varicose Veins Mother    Diabetes Paternal Grandmother    Cancer Paternal Grandfather    CAD Paternal Uncle  Prior to Admission medications   Medication Sig Start Date End Date Taking? Authorizing Provider  amiodarone (PACERONE) 200 MG tablet Take 1 tablet (200 mg total) by mouth daily. 09/25/22  Yes Pray, Milus Mallick, MD  ELIQUIS 5 MG TABS tablet Take 5 mg by mouth 2 (two) times daily. 12/09/21  Yes [provider]  midodrine (PROAMATINE) 10 MG tablet Take 1 tablet (10 mg total) by mouth 3 (three) times daily with meals. Patient taking differently: Take 10 mg by mouth See admin instructions. TAKE 1 TABLET BY MOUTH THREE TIMES A WEEK. TAKE 30 MINUTES BEFORE HEMODIALYSIS. TAKE 1 EXTRA TABLET MID TREATMENT 09/24/22  Yes Alfredo Martinez, MD  midodrine (PROAMATINE) 5 MG tablet Take 5 mg by mouth See admin instructions. Take 1 tablet by mouth three times a day on Mon, Wed, Fri (non-dialysis days) 10/28/22  Yes [provider]  Tenapanor HCl, CKD, (XPHOZAH) 30 MG TABS Take 30 mg by mouth daily.   Yes [provider]  traZODone  (DESYREL) 50 MG tablet Take 50 mg by mouth at bedtime as needed for sleep.   Yes [provider]  VELPHORO 500 MG chewable tablet Chew 1,000 mg by mouth 2 (two) times daily after a meal. 02/06/21  Yes [provider]  VISINE 0.05 % ophthalmic solution Place 2-3 drops into both eyes 2 (two) times daily as needed (dry eye). 07/04/21  Yes [provider]  acetaminophen (TYLENOL) 500 MG tablet Take 1 tablet (500 mg total) by mouth every 6 (six) hours as needed. Patient taking differently: Take 500 mg by mouth every 6 (six) hours as needed for mild pain (pain score 1-3). 04/11/18   Georgetta Haber, NP  albuterol (PROAIR HFA) 108 (90 Base) MCG/ACT inhaler Inhale 2 puffs into the lungs every 6 (six) hours as needed for wheezing or shortness of breath. Patient not taking: Reported on 11/29/2022 08/25/21   Hoy Register, MD  albuterol (PROVENTIL) (2.5 MG/3ML) 0.083% nebulizer solution Take 3 mLs (2.5 mg total) by nebulization every 6 (six) hours as needed for wheezing or shortness of breath. Patient not taking: Reported on 11/29/2022 08/25/21   Hoy Register, MD  fluticasone-salmeterol (ADVAIR DISKUS) 250-50 MCG/ACT AEPB Inhale 1 puff into the lungs in the morning and at bedtime. NEEDS PASS Patient not taking: Reported on 11/29/2022 08/09/21   Claiborne Rigg, NP    Physical Exam: Vitals:   11/29/22 0645 11/29/22 0700 11/29/22 0800 11/29/22 0900  BP: (!) 148/92 133/77 135/85 134/69  Pulse:   69   Resp: (!) 21 (!) 21 19 17   Temp: 97.7 F (36.5 C)     TempSrc:      SpO2: 99% 100% 100% 100%    Constitutional: NAD, calm, comfortable Vitals:   11/29/22 0645 11/29/22 0700 11/29/22 0800 11/29/22 0900  BP: (!) 148/92 133/77 135/85 134/69  Pulse:   69   Resp: (!) 21 (!) 21 19 17   Temp: 97.7 F (36.5 C)     TempSrc:      SpO2: 99% 100% 100% 100%   Eyes: PERRL, lids and conjunctivae normal ENMT: Mucous membranes are moist. Posterior pharynx clear of any exudate or  lesions Neck: normal, supple, no masses, no thyromegaly Respiratory: crackles at bases no wheezing, . Normal respiratory effort. No accessory muscle use.  Cardiovascular: Regular rate and rhythm, no murmurs / rubs / gallops. No extremity edema  Abdomen: no tenderness, no masses palpated. No hepatosplenomegaly. Bowel sounds positive.  Musculoskeletal: no clubbing / cyanosis. No joint  deformity upper and lower extremities. Good ROM, no contractures. Normal muscle tone.  Skin: no rashes, lesions, ulcers. No induration Neurologic: CN 2-12 grossly intact. Sensation intact, . Strength 5/5 in all 4.  Psychiatric: Normal judgment and insight. Alert and oriented x 3. Normal mood.    Labs on Admission: I have personally reviewed following labs and imaging studies  CBC: Recent Labs  Lab 11/29/22 0332  WBC 5.5  NEUTROABS 3.4  HGB 10.4*  HCT 35.2*  MCV 89.1  PLT 105*   Basic Metabolic Panel: Recent Labs  Lab 11/29/22 0332  NA 137  K 3.6  CL 99  CO2 24  GLUCOSE 129*  BUN 50*  CREATININE 8.21*  CALCIUM 9.5   GFR: CrCl cannot be calculated (Unknown ideal weight.). Liver Function Tests: Recent Labs  Lab 11/29/22 0332  AST 184*  ALT 77*  ALKPHOS 93  BILITOT 0.8  PROT 7.8  ALBUMIN 2.5*   No results for input(s): "LIPASE", "AMYLASE" in the last 168 hours. No results for input(s): "AMMONIA" in the last 168 hours. Coagulation Profile: No results for input(s): "INR", "PROTIME" in the last 168 hours. Cardiac Enzymes: No results for input(s): "CKTOTAL", "CKMB", "CKMBINDEX", "TROPONINI" in the last 168 hours. BNP (last 3 results) No results for input(s): "PROBNP" in the last 8760 hours. HbA1C: No results for input(s): "HGBA1C" in the last 72 hours. CBG: No results for input(s): "GLUCAP" in the last 168 hours. Lipid Profile: No results for input(s): "CHOL", "HDL", "LDLCALC", "TRIG", "CHOLHDL", "LDLDIRECT" in the last 72 hours. Thyroid Function Tests: No results for input(s):  "TSH", "T4TOTAL", "FREET4", "T3FREE", "THYROIDAB" in the last 72 hours. Anemia Panel: No results for input(s): "VITAMINB12", "FOLATE", "FERRITIN", "TIBC", "IRON", "RETICCTPCT" in the last 72 hours. Urine analysis:    Component Value Date/Time   COLORURINE YELLOW 02/13/2010 0703   APPEARANCEUR CLOUDY (A) 02/13/2010 0703   LABSPEC 1.013 02/13/2010 0703   PHURINE 8.5 (H) 02/13/2010 0703   GLUCOSEU NEGATIVE 12/18/2009 0546   HGBUR LARGE (A) 02/13/2010 0703   BILIRUBINUR NEGATIVE 02/13/2010 0703   KETONESUR NEGATIVE 02/13/2010 0703   PROTEINUR >300 (A) 02/13/2010 0703   UROBILINOGEN 0.2 02/13/2010 0703   NITRITE NEGATIVE 02/13/2010 0703   LEUKOCYTESUR NEGATIVE 02/13/2010 0703    Radiological Exams on Admission: DG Chest Port 1 View  Result Date: 11/29/2022 CLINICAL DATA:  Shortness of breath and cough EXAM: PORTABLE CHEST 1 VIEW COMPARISON:  09/18/2022 FINDINGS: Cardiac shadow is enlarged but stable. Dialysis catheter is again noted on the left. Increasing patchy airspace opacities are noted in the bases bilaterally. Chronic blunting of the costophrenic angles is seen. No large effusion is noted. No bony abnormality is seen. IMPRESSION: Increase in bibasilar airspace opacities when compared with the prior exam. Electronically Signed   By: Alcide Clever M.D.   On: 11/29/2022 02:47    EKG: Independently reviewed. See above Assessment/Plan    Acute COPD exacerbation with acute hypoxic respiratory failure  -in back ground of restrictive lung disease -admit to progressive care  wean bipap as able - solumedrol iv , taper prednisone per protocol - ctx/ azithromycin due to severity of symptoms and associated cap -f/u on sputum cultures  - nebs standing and prn  -resume chronic inhalers  -pulmonary toilet  -wean O2 as able    Probable Bacterial Pneumonia  CAP -patient with Opacities on cxr  -ctx/ azithromycin, de-escalate as able  -pulmonary toilet  -urine ag, sputum, f/u on culture  data     ESRD on HD TTS  -  renal consulted for ED  - patient will have scheduled HD today   HFrEF -no acute exacerbation  -resume GDMT as able   P-Afib VT -  cont apixaban 5 mg BID, as well as amiodarone   HTN -stable of medications currently    H/O CVA -continue on secondary ppx  -on apixaban    Anemia of chronic dx -h/h at baseline  -followed by nephrology   DM2 -Iss/fs   Pulmonary hypertension   IPF  Liver disease with Cirrhosis, DVT prophylaxis:  on OAC Code Status: full/ as discussed per patient wishes in event of cardiac arrest  Family Communication: Nathaniel White, Nathaniel White (Mother) [phone] (Home Phone)  Disposition Plan: patient  expected to be admitted greater than 2 midnights  Consults called: renal Admission status: progressive care   Lurline Del MD [facility]   If 7PM-7AM, please contact night-coverage [email or web address] Password [facility]  11/29/2022, 9:24 AM

## 2022-11-29 NOTE — ED Notes (Signed)
CCMD called and notified, patient placed into monitor.

## 2022-11-30 ENCOUNTER — Telehealth (HOSPITAL_COMMUNITY): Payer: Self-pay | Admitting: Pharmacy Technician

## 2022-11-30 ENCOUNTER — Other Ambulatory Visit (HOSPITAL_COMMUNITY): Payer: Self-pay

## 2022-11-30 DIAGNOSIS — Z992 Dependence on renal dialysis: Secondary | ICD-10-CM

## 2022-11-30 DIAGNOSIS — J449 Chronic obstructive pulmonary disease, unspecified: Secondary | ICD-10-CM | POA: Diagnosis not present

## 2022-11-30 DIAGNOSIS — N186 End stage renal disease: Secondary | ICD-10-CM

## 2022-11-30 DIAGNOSIS — J441 Chronic obstructive pulmonary disease with (acute) exacerbation: Secondary | ICD-10-CM | POA: Diagnosis not present

## 2022-11-30 LAB — CBC
HCT: 32.9 % — ABNORMAL LOW (ref 39.0–52.0)
Hemoglobin: 10.2 g/dL — ABNORMAL LOW (ref 13.0–17.0)
MCH: 26.6 pg (ref 26.0–34.0)
MCHC: 31 g/dL (ref 30.0–36.0)
MCV: 85.9 fL (ref 80.0–100.0)
Platelets: 104 10*3/uL — ABNORMAL LOW (ref 150–400)
RBC: 3.83 MIL/uL — ABNORMAL LOW (ref 4.22–5.81)
RDW: 16 % — ABNORMAL HIGH (ref 11.5–15.5)
WBC: 4.7 10*3/uL (ref 4.0–10.5)
nRBC: 0 % (ref 0.0–0.2)

## 2022-11-30 LAB — COMPREHENSIVE METABOLIC PANEL WITH GFR
ALT: 83 U/L — ABNORMAL HIGH (ref 0–44)
AST: 168 U/L — ABNORMAL HIGH (ref 15–41)
Albumin: 2.5 g/dL — ABNORMAL LOW (ref 3.5–5.0)
Alkaline Phosphatase: 81 U/L (ref 38–126)
Anion gap: 18 — ABNORMAL HIGH (ref 5–15)
BUN: 74 mg/dL — ABNORMAL HIGH (ref 6–20)
CO2: 19 mmol/L — ABNORMAL LOW (ref 22–32)
Calcium: 9.2 mg/dL (ref 8.9–10.3)
Chloride: 98 mmol/L (ref 98–111)
Creatinine, Ser: 10.11 mg/dL — ABNORMAL HIGH (ref 0.61–1.24)
GFR, Estimated: 5 mL/min — ABNORMAL LOW
Glucose, Bld: 150 mg/dL — ABNORMAL HIGH (ref 70–99)
Potassium: 5 mmol/L (ref 3.5–5.1)
Sodium: 135 mmol/L (ref 135–145)
Total Bilirubin: 1 mg/dL
Total Protein: 8 g/dL (ref 6.5–8.1)

## 2022-11-30 LAB — GLUCOSE, CAPILLARY: Glucose-Capillary: 140 mg/dL — ABNORMAL HIGH (ref 70–99)

## 2022-11-30 MED ORDER — DOXYCYCLINE HYCLATE 100 MG PO CAPS: 100.0000 mg | ORAL_CAPSULE | Freq: Two times a day (BID) | ORAL | 0 refills | Status: AC

## 2022-11-30 MED ORDER — HEPARIN SODIUM (PORCINE) 1000 UNIT/ML IJ SOLN
1000.0000 [IU] | Freq: Once | INTRAMUSCULAR | Status: AC
  Administered 2022-11-30: 1000 [IU] via INTRAVENOUS

## 2022-11-30 MED ORDER — SENNOSIDES-DOCUSATE SODIUM 8.6-50 MG PO TABS: 2.0000 | ORAL_TABLET | Freq: Every day | ORAL | 1 refills | Status: DC

## 2022-11-30 MED ORDER — ALBUTEROL SULFATE HFA 108 (90 BASE) MCG/ACT IN AERS: 2.0000 | INHALATION_SPRAY | Freq: Four times a day (QID) | RESPIRATORY_TRACT | 2 refills | Status: DC | PRN

## 2022-11-30 MED ORDER — MOMETASONE FURO-FORMOTEROL FUM 200-5 MCG/ACT IN AERO: 2.0000 | INHALATION_SPRAY | Freq: Two times a day (BID) | RESPIRATORY_TRACT | 4 refills | Status: DC

## 2022-11-30 MED ORDER — POLYETHYLENE GLYCOL 3350 17 G PO PACK: 17.0000 g | PACK | Freq: Every day | ORAL | 3 refills | Status: DC | PRN

## 2022-11-30 MED ORDER — PREDNISONE 20 MG PO TABS: 40.0000 mg | ORAL_TABLET | Freq: Every day | ORAL | 0 refills | Status: AC

## 2022-11-30 MED ORDER — CHLORHEXIDINE GLUCONATE CLOTH 2 % EX PADS: 6.0000 | MEDICATED_PAD | Freq: Every day | CUTANEOUS | Status: DC

## 2022-11-30 NOTE — Discharge Summary (Signed)
Physician Discharge Summary   Patient: Nathaniel White MRN: [id number] DOB: 1960-07-16  Admit date:     11/29/2022  Discharge date: 11/30/22  Discharge Physician: Thad Ranger, MD    PCP: Claiborne Rigg, NP   Recommendations at discharge:   Placed on prednisone 40 mg daily for 5 days Placed on doxycycline 100 mg twice daily for 6 days Patient will go to his outpatient dialysis center for HD tomorrow for TTS schedule  Discharge Diagnoses:    Acute on chronic respiratory failure (HCC) with hypoxia Acute COPD exacerbation Pulmonary edema with volume overload ESRD on hemodialysis Acute on chronic HFrEF Paroxysmal A-fib Anemia of chronic disease/ESRD History of CVA  Hospital Course: Patient is a 62 y.o. male with medical history significant of HFrEF, pAFib, VT, mitral regurgitation, ESRD on HDTTS, HTN, H/O CVA, restrictive lung dx, COPD, anemia of chronic dx, DM2, pulmonary hypertension ,cirrhosis, hx of CVA,who presents to ED BIBEMS with complaint of acute sob x 2 hours. In the field per chart patient was found to have Sats of 80% . Patient was treated with 2 gram mag, duoneb , albuterol and 125 mg solumedrol , and placed on CPAP prior to arrival in ED. Patient notes acute onset,notes no associated chills, n/v//d/ abdominal pain, chest pain, presyncope  or fever.  He does endorse constipation.  Assessment and Plan:   Acute on chronic respiratory failure with hypoxia -Now improving, patient was placed on BiPAP in ED. -At baseline patient is on 2 L O2 via [place] -Patient received IV Solu-Medrol, DuoNebs, Dulera, IV antibiotics with significant improvement -Underwent urgent hemodialysis, currently off BiPAP  - weaned down back to 2 L O2 via nasal cannula, at his baseline  Acute COPD exacerbation, ?  CAP pneumonia -Chest x-ray showed bilateral opacities however likely pulmonary edema, with significant improvement after hemodialysis -Patient was placed on IV vancomycin, cefepime,  transition to oral doxycycline -Continue albuterol inhaler, Dulera, prednisone 40 mg daily for 5 days  ESRD on hemodialysis, pulmonary edema, volume overload Acute on chronic HFrEF -Patient underwent hemodialysis, did not have HD on Thursday due to ED visit -He feels a lot better after the hemodialysis today and wants to go home, and resume his outpatient dialysis per his TTS schedule tomorrow -He is cleared by nephrology to discharge home.  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 Blood tests confirm. IM PCN weekly x 3 for T. pallidium.       Pain control - [employer] Controlled Substance Reporting System database was reviewed. and patient was instructed, not to drive, operate heavy machinery, perform activities at heights, swimming or participation in water activities or provide baby-sitting services while on Pain, Sleep and Anxiety Medications; until their outpatient Physician has advised to do so again. Also recommended to not to take more than prescribed Pain, Sleep and Anxiety Medications.  Consultants: Nephrology Procedures performed: Hemodialysis Disposition: Home Diet recommendation:  Discharge Diet Orders (From admission, onward)     Start     Ordered   11/30/22 0000  Diet - low sodium heart healthy        11/30/22 1341            DISCHARGE MEDICATION: Allergies as of 11/30/2022  Reactions   Codeine Shortness Of Breath   Dextromethorphan-guaifenesin Shortness Of Breath, Other (See Comments)   Iron Dextran Shortness Of Breath, Other (See Comments)   Morphine And Codeine Shortness Of Breath   Other Reaction(s): Other (See Comments)         Medication List     STOP taking these medications    fluticasone-salmeterol 250-50 MCG/ACT Aepb Commonly known as: Advair Diskus Replaced by: mometasone-formoterol 200-5 MCG/ACT Aero       TAKE these medications    acetaminophen 500 MG tablet Commonly known as: TYLENOL Take 1 tablet (500 mg total) by mouth every 6 (six) hours as needed. What changed: reasons to take this   albuterol (2.5 MG/3ML) 0.083% nebulizer solution Commonly known as: PROVENTIL Take 3 mLs (2.5 mg total) by nebulization every 6 (six) hours as needed for wheezing or shortness of breath.   albuterol 108 (90 Base) MCG/ACT inhaler Commonly known as: ProAir HFA Inhale 2 puffs into the lungs every 6 (six) hours as needed for wheezing or shortness of breath.   amiodarone 200 MG tablet Commonly known as: PACERONE Take 1 tablet (200 mg total) by mouth daily.   doxycycline 100 MG capsule Commonly known as: VIBRAMYCIN Take 1 capsule (100 mg total) by mouth 2 (two) times daily for 6 days.   Eliquis 5 MG Tabs tablet Generic drug: apixaban Take 5 mg by mouth 2 (two) times daily.   midodrine 10 MG tablet Commonly known as: PROAMATINE Take 1 tablet (10 mg total) by mouth 3 (three) times daily with meals. What changed:  when to take this additional instructions   mometasone-formoterol 200-5 MCG/ACT Aero Commonly known as: DULERA Inhale 2 puffs into the lungs 2 (two) times daily. Replaces: fluticasone-salmeterol 250-50 MCG/ACT Aepb   polyethylene glycol 17 g packet Commonly known as: MiraLax Take 17 g by mouth daily as needed for moderate constipation or mild constipation. Also available over-the-counter   predniSONE 20 MG tablet Commonly known as: DELTASONE Take 2 tablets (40 mg total) by mouth daily with breakfast for 5 days.   senna-docusate 8.6-50 MG tablet Commonly known as: Senokot-S Take 2 tablets by mouth at bedtime. For constipation   traZODone 50 MG tablet Commonly known as:  DESYREL Take 50 mg by mouth at bedtime as needed for sleep.   Velphoro 500 MG chewable tablet Generic drug: sucroferric oxyhydroxide Chew 1,000 mg by mouth 2 (two) times daily after a meal.   Visine 0.05 % ophthalmic solution Generic drug: tetrahydrozoline Place 2-3 drops into both eyes 2 (two) times daily as needed (dry eye).   Xphozah 30 MG Tabs Generic drug: Tenapanor HCl (CKD) Take 30 mg by mouth daily.        Follow-up Information     Claiborne Rigg, NP. Schedule an appointment as soon as possible for a visit in 2 week(s).   Specialty: Nurse Practitioner Why: for hospital follow-up Contact information: [address] [place] [address] [phone]                Discharge Exam: Ceasar Mons Weights   11/30/22 1027 11/30/22 0634 11/30/22 1035  Weight: 74.1 kg 76.1 kg 73 kg   S: Seen during dialysis, patient feeling a lot better and wants to go home after the dialysis.  Cleared by nephrology and recommended to resume dialysis tomorrow per his TTS schedule  BP 108/74 (BP Location: Left Arm)   Pulse 83   Temp 97.6 F (36.4 C) (Oral)   Resp Marland Kitchen)  23   Ht 5\' 9"  (1.753 m)   Wt 73 kg   SpO2 92%   BMI 23.77 kg/m   Physical Exam General: Alert and oriented x 3, NAD Cardiovascular: S1 S2 clear, RRR.  Respiratory: CTAB, no wheezing Gastrointestinal: Soft, nontender, nondistended, NBS Ext: no pedal edema bilaterally Neuro: no acute/new deficits Psych: Normal affect    Condition at discharge: fair  The results of significant diagnostics from this hospitalization (including imaging, microbiology, ancillary and laboratory) are listed below for reference.   Imaging Studies: DG Chest Port 1 View  Result Date: 11/29/2022 CLINICAL DATA:  Shortness of breath and cough EXAM: PORTABLE CHEST 1 VIEW COMPARISON:  09/18/2022 FINDINGS: Cardiac shadow is enlarged but stable. Dialysis catheter is again noted on the left. Increasing patchy airspace opacities  are noted in the bases bilaterally. Chronic blunting of the costophrenic angles is seen. No large effusion is noted. No bony abnormality is seen. IMPRESSION: Increase in bibasilar airspace opacities when compared with the prior exam. Electronically Signed   By: Alcide Clever M.D.   On: 11/29/2022 02:47    Microbiology: Results for orders placed or performed during the hospital encounter of 11/29/22  Resp panel by RT-PCR (RSV, Flu A&B, Covid) Anterior Nasal Swab     Status: None   Collection Time: 11/29/22  5:44 AM   Specimen: Anterior Nasal Swab  Result Value Ref Range Status   SARS Coronavirus 2 by RT PCR NEGATIVE NEGATIVE Final   Influenza A by PCR NEGATIVE NEGATIVE Final   Influenza B by PCR NEGATIVE NEGATIVE Final    Comment: (NOTE) The Xpert Xpress SARS-CoV-2/FLU/RSV plus assay is intended as an aid in the diagnosis of influenza from Nasopharyngeal swab specimens and should not be used as a sole basis for treatment. Nasal washings and aspirates are unacceptable for Xpert Xpress SARS-CoV-2/FLU/RSV testing.  Fact Sheet for Patients: BloggerCourse.com  Fact Sheet for Healthcare Providers: SeriousBroker.it  This test is not yet approved or cleared by the [place] FDA and has been authorized for detection and/or diagnosis of SARS-CoV-2 by FDA under an Emergency Use Authorization (EUA). This EUA will remain in effect (meaning this test can be used) for the duration of the COVID-19 declaration under Section 564(b)(1) of the Act, 21 U.S.C. section 360bbb-3(b)(1), unless the authorization is terminated or revoked.     Resp Syncytial Virus by PCR NEGATIVE NEGATIVE Final    Comment: (NOTE) Fact Sheet for Patients: BloggerCourse.com  Fact Sheet for Healthcare Providers: SeriousBroker.it  This test is not yet approved or cleared by the [place] FDA and has been authorized for  detection and/or diagnosis of SARS-CoV-2 by FDA under an Emergency Use Authorization (EUA). This EUA will remain in effect (meaning this test can be used) for the duration of the COVID-19 declaration under Section 564(b)(1) of the Act, 21 U.S.C. section 360bbb-3(b)(1), unless the authorization is terminated or revoked.  Performed at [facility], [address] [place], [place] [address]   MRSA Next Gen by PCR, Nasal     Status: None   Collection Time: 11/29/22 11:42 AM   Specimen: Nasal Mucosa; Nasal Swab  Result Value Ref Range Status   MRSA by PCR Next Gen NOT DETECTED NOT DETECTED Final    Comment: (NOTE) The GeneXpert MRSA Assay (FDA approved for NASAL specimens only), is one component of a comprehensive MRSA colonization surveillance program. It is not intended to diagnose MRSA infection nor to guide or monitor treatment for MRSA infections. Test performance is  not FDA approved in patients less than 13 years old. Performed at [facility], [address] [place], [place] [address]     Labs: CBC: Recent Labs  Lab 11/29/22 0332 11/29/22 1221 11/30/22 0221  WBC 5.5 2.4* 4.7  NEUTROABS 3.4 2.2  --   HGB 10.4* 10.3* 10.2*  HCT 35.2* 33.4* 32.9*  MCV 89.1 87.4 85.9  PLT 105* 104* 104*   Basic Metabolic Panel: Recent Labs  Lab 11/29/22 0332 11/29/22 1221 11/30/22 0221  NA 137 136 135  K 3.6 4.1 5.0  CL 99 98 98  CO2 24 21* 19*  GLUCOSE 129* 148* 150*  BUN 50* 57* 74*  CREATININE 8.21* 8.60* 10.11*  CALCIUM 9.5 9.8 9.2   Liver Function Tests: Recent Labs  Lab 11/29/22 0332 11/29/22 1221 11/30/22 0221  AST 184* 203* 168*  ALT 77* 88* 83*  ALKPHOS 93 88 81  BILITOT 0.8 0.6 1.0  PROT 7.8 8.3* 8.0  ALBUMIN 2.5* 2.5* 2.5*   CBG: Recent Labs  Lab 11/29/22 1408 11/30/22 0530  GLUCAP 129* 140*    Discharge time spent: greater than 30 minutes.  Signed: Thad Ranger, MD [facility] 11/30/2022

## 2022-11-30 NOTE — Care Management Obs Status (Signed)
MEDICARE OBSERVATION STATUS NOTIFICATION   Patient Details  Name: NAM REISH MRN: [id number] Date of Birth: 19-Oct-1960   Medicare Observation Status Notification Given:  Yes    Darrold Span, RN 11/30/2022, 3:09 PM

## 2022-11-30 NOTE — Progress Notes (Signed)
D/C order noted. Contacted [facility] to advise clinic of pt's d/c today and that pt should resume care tomorrow.   Olivia Canter Renal Navigator [phone]

## 2022-11-30 NOTE — Care Management CC44 (Signed)
Condition Code 44 Documentation Completed  Patient Details  Name: YOGESH SMOKER MRN: [id number] Date of Birth: 09/01/1960   Condition Code 44 given:  Yes Patient signature on Condition Code 44 notice:  Yes Documentation of 2 MD's agreement:  Yes Code 44 added to claim:  Yes    Darrold Span, RN 11/30/2022, 3:09 PM

## 2022-11-30 NOTE — Telephone Encounter (Signed)
Pharmacy Patient Advocate Encounter   Received notification from Fax that prior authorization for [facility] 200-5MCG/ACT aerosol is required/requested.   Insurance verification completed.   The patient is insured through [facility] .   Per test claim: PA required; PA submitted to above mentioned insurance via CoverMyMeds Key/confirmation #/EOC [id number] Status is pending

## 2022-11-30 NOTE — Discharge Planning (Signed)
[facility] Patient Discharge Orders- [facility] CLINIC: [facility]  Patient's name: KORAY ZAPPONE Admit/DC Dates: 11/29/2022 - 11/30/22  Discharge Diagnoses: Shortness of breath   COPD exacerbation  Aranesp: Given: no   Date and amount of last dose: N/A  Last Hgb: 10.2 PRBC's Given: no Date/# of units: N/A ESA dose for discharge: mircera 100 mcg IV q 2 weeks  IV Iron dose at discharge: none  Heparin change: no  EDW Change: no New EDW:   [place] Change: no  Access intervention/Change: no Details:  Hectorol/Calcitriol change: no  Discharge Labs: Calcium 9.2 Phosphorus 6.2 Albumin 2.5 K+ 5.0  IV Antibiotics: no Details:  On Coumadin?: no Last INR: Next INR: Managed By:   OTHER/APPTS/LAB ORDERS:    D/C Meds to be reconciled by nurse after every discharge.  Completed By: Rogers Blocker, PA-C 11/30/2022, 11:29 AM   [facility] Pager: [phone]    Reviewed by: MD:______ RN_______

## 2022-11-30 NOTE — Progress Notes (Addendum)
Went over AVS paperwork with patient, answered all questions, iv removed, changed into regular clothing, belongings gathered, home meds given and signed for, copy placed in chart and patient was wheeled out to friends vehicle.

## 2022-11-30 NOTE — Progress Notes (Signed)
[facility] Progress Note   Subjective:   Seen on HD. Noted tele alarm for v tach but must have been a brief run, RRR and asymptomatic at time of my exam. Has a history of tachycardia. On O2, denies SOB at present. Denies CP, palpitations, dizziness, abdominal pain, nausea.    Objective Vitals:   11/30/22 0637 11/30/22 0700 11/30/22 0730 11/30/22 0800  BP: 132/81 123/86 112/80 118/77  Pulse: (!) 56 63 85 84  Resp: 19 (!) 21 (!) 22 (!) 36  Temp:      TempSrc:      SpO2: 100% 100% 100% 100%  Weight:      Height:       Physical Exam General: Alert male in NAD Heart: RRR, no murmurs, rubs or gallops Lungs: On O2 via [place], CTA anteriorly Abdomen: Soft, non-distended, +BS Extremities: No edema b/l lower extremities Dialysis Access: [facility] accessed  Additional Objective Labs: Basic Metabolic Panel: Recent Labs  Lab 11/29/22 0332 11/29/22 1221 11/30/22 0221  NA 137 136 135  K 3.6 4.1 5.0  CL 99 98 98  CO2 24 21* 19*  GLUCOSE 129* 148* 150*  BUN 50* 57* 74*  CREATININE 8.21* 8.60* 10.11*  CALCIUM 9.5 9.8 9.2   Liver Function Tests: Recent Labs  Lab 11/29/22 0332 11/29/22 1221 11/30/22 0221  AST 184* 203* 168*  ALT 77* 88* 83*  ALKPHOS 93 88 81  BILITOT 0.8 0.6 1.0  PROT 7.8 8.3* 8.0  ALBUMIN 2.5* 2.5* 2.5*   No results for input(s): "LIPASE", "AMYLASE" in the last 168 hours. CBC: Recent Labs  Lab 11/29/22 0332 11/29/22 1221 11/30/22 0221  WBC 5.5 2.4* 4.7  NEUTROABS 3.4 2.2  --   HGB 10.4* 10.3* 10.2*  HCT 35.2* 33.4* 32.9*  MCV 89.1 87.4 85.9  PLT 105* 104* 104*   Blood Culture    Component Value Date/Time   SDES FLUID 09/16/2022 1617   SPECREQUEST NONE 09/16/2022 1617   CULT  09/16/2022 1617    FEW ESCHERICHIA COLI FEW BACTEROIDES OVATUS MODERATE BACTEROIDES FRAGILIS RARE STREPTOCOCCUS ANGINOSIS BETA LACTAMASE POSITIVE Performed at [facility], [address] [place], [place] [address]    REPTSTATUS 09/21/2022 FINAL  09/16/2022 1617    Cardiac Enzymes: No results for input(s): "CKTOTAL", "CKMB", "CKMBINDEX", "TROPONINI" in the last 168 hours. CBG: Recent Labs  Lab 11/29/22 1408 11/30/22 0530  GLUCAP 129* 140*   Iron Studies: No results for input(s): "IRON", "TIBC", "TRANSFERRIN", "FERRITIN" in the last 72 hours. @lablastinr3 @ Studies/Results: DG Chest Port 1 View  Result Date: 11/29/2022 CLINICAL DATA:  Shortness of breath and cough EXAM: PORTABLE CHEST 1 VIEW COMPARISON:  09/18/2022 FINDINGS: Cardiac shadow is enlarged but stable. Dialysis catheter is again noted on the left. Increasing patchy airspace opacities are noted in the bases bilaterally. Chronic blunting of the costophrenic angles is seen. No large effusion is noted. No bony abnormality is seen. IMPRESSION: Increase in bibasilar airspace opacities when compared with the prior exam. Electronically Signed   By: Alcide Clever M.D.   On: 11/29/2022 02:47   Medications:  ceFEPime (MAXIPIME) IV      amiodarone  200 mg Oral Daily   apixaban  5 mg Oral BID   Chlorhexidine Gluconate Cloth  6 each Topical Daily   Chlorhexidine Gluconate Cloth  6 each Topical Q0600   dextrose  1 Tube Oral STAT   ipratropium-albuterol  3 mL Nebulization Q6H   midodrine  10 mg Oral TID WC   mometasone-formoterol  2 puff Inhalation BID   predniSONE  40 mg Oral Q breakfast   sucroferric oxyhydroxide  1,000 mg Oral BID PC    Dialysis Orders: TTS SW 4:15hr, 450/600, EDW 68.5 kg, 2K/2Ca bath, TDC, no heparin - last OP HD 9/05, post wt 70.6 kg - coming off 2-4kg over for last 3 wks - parsabiv 7.5mg  IV three times per week - Mircera 100 q2weeks (last given 11/12)   Home meds include - albuterol, amiodarone, eliquis, advair diskus, midodrine 5mg  1- 2 on non-hd days, singulair, velphoro 1 gm ac tid, prns   Assessment/Plan: ESRD: Continue HD on usual TTS schedule; did not have HD Thursday due to ED visit. On HD now. Resume TTS schedule tomorrow.  HypoTN/ volume:  No edema on exam - BP stable, was using midodrine pre-HD at home -> sometimes requires 10mg  TID. Has a history of large IDWG than can be removed due to chronic hypotension. Continue UF with HD as tolerated.  Anemia of ESRD: Hgb 10.2, last Mircera on 11/12. Secondary HPTH: CorrCa high, Phos fine. On Velphoro as binder. Resume Parsabiv as outpatient (not formulary here). No VDRA Hx CVA with residual left sided weakness: On Eliquis  H/o penile infection/sepsis: in 09/2022 seen by urology requiring debridement with biopsy on 9/10. 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 Blood tests confirm. IM PCN weekly x 3 for T. pallidium. Nutrition: Alb very low, continue supplements    Rogers Blocker, PA-C 11/30/2022, 8:22 AM  [facility] Pager: [phone]

## 2022-11-30 NOTE — Progress Notes (Signed)
   11/30/22 1035  Vitals  Temp (!) 97.5 F (36.4 C)  Pulse Rate 85  Resp 18  BP 103/71  SpO2 100 %  O2 Device Nasal Cannula  Weight 73 kg  Type of Weight Post-Dialysis  Oxygen Therapy  O2 Flow Rate (L/min) 3 L/min  Patient Activity (if Appropriate) In bed  Post Treatment  Dialyzer Clearance Clear  Liters Processed 73.6  Fluid Removed (mL) 2.9 mL  Tolerated HD Treatment Yes   Received patient in bed to unit.  Alert and oriented.  Informed consent signed and in chart.   TX duration: 3.5  Patient tolerated well.  Transported back to the room  Alert, without acute distress.  Hand-off given to patient's nurse.   Access used: Yes  Access issues: No  Total UF removed: 2900 Medication(s) given: See MAR Post HD VS: See Above Grid Post HD weight: 73 kg   [facility]

## 2022-12-01 LAB — HEPATITIS B SURFACE ANTIBODY, QUANTITATIVE: Hep B S AB Quant (Post): 739 m[IU]/mL

## 2022-12-03 ENCOUNTER — Telehealth: Payer: Self-pay

## 2022-12-03 NOTE — Telephone Encounter (Signed)
Pharmacy Patient Advocate Encounter  Received notification from [facility] that Prior Authorization for [facility] 200-5MCG/ACT aerosol  has been DENIED.  Full denial letter will be uploaded to the media tab. See denial reason below.   PA #/Case ID/Reference #: [id number]

## 2022-12-03 NOTE — Transitions of Care (Post Inpatient/ED Visit) (Signed)
   12/03/2022  Name: DEMIKO CADIENTE MRN: [id number] DOB: [birth year]  Today's TOC FU Call Status: Today's TOC FU Call Status:: Unsuccessful Call (1st Attempt) Unsuccessful Call (1st Attempt) Date: 12/03/22  Attempted to reach the patient regarding the most recent Inpatient/ED visit.  Follow Up Plan: Additional outreach attempts will be made to reach the patient to complete the Transitions of Care (Post Inpatient/ED visit) call.   Signature:  Robyne Peers, RN

## 2022-12-04 ENCOUNTER — Telehealth: Payer: Self-pay

## 2022-12-04 NOTE — Transitions of Care (Post Inpatient/ED Visit) (Signed)
   12/04/2022  Name: Nathaniel White MRN: [id number] DOB: 1960-05-09  Today's TOC FU Call Status: Today's TOC FU Call Status:: Unsuccessful Call (2nd Attempt) Unsuccessful Call (1st Attempt) Date: 12/03/22 Unsuccessful Call (2nd Attempt) Date: 12/04/22  Attempted to reach the patient regarding the most recent Inpatient/ED visit.  Follow Up Plan: Additional outreach attempts will be made to reach the patient to complete the Transitions of Care (Post Inpatient/ED visit) call.   Signature  Robyne Peers, RN

## 2022-12-05 ENCOUNTER — Telehealth: Payer: Self-pay

## 2022-12-05 NOTE — Transitions of Care (Post Inpatient/ED Visit) (Signed)
   12/05/2022  Name: Nathaniel White MRN: [id number] DOB: Apr 04, 1960  Today's TOC FU Call Status: Today's TOC FU Call Status:: Unsuccessful Call (3rd Attempt) Unsuccessful Call (1st Attempt) Date: 12/03/22 Unsuccessful Call (2nd Attempt) Date: 12/04/22 Unsuccessful Call (3rd Attempt) Date: 12/05/22  Attempted to reach the patient regarding the most recent Inpatient/ED visit.  Follow Up Plan: No further outreach attempts will be made at this time. We have been unable to contact the patient.  Letter sent to patient requesting he contact [facility] to schedule a follow up appointment as we have not been able to reach him   Signature  Robyne Peers, RN

## 2022-12-27 ENCOUNTER — Emergency Department (HOSPITAL_COMMUNITY): Payer: Medicare Other

## 2022-12-27 ENCOUNTER — Inpatient Hospital Stay (HOSPITAL_COMMUNITY)
Admission: EM | Admit: 2022-12-27 | Discharge: 2023-01-01 | DRG: 190 | Disposition: A | Discharge status: Home / Self Care | Payer: Medicare Other | Attending: Internal Medicine | Admitting: Internal Medicine

## 2022-12-27 ENCOUNTER — Encounter (HOSPITAL_COMMUNITY): Payer: Self-pay

## 2022-12-27 ENCOUNTER — Other Ambulatory Visit: Payer: Self-pay

## 2022-12-27 DIAGNOSIS — N2581 Secondary hyperparathyroidism of renal origin: Secondary | ICD-10-CM | POA: Diagnosis present

## 2022-12-27 DIAGNOSIS — Z89422 Acquired absence of other left toe(s): Secondary | ICD-10-CM

## 2022-12-27 DIAGNOSIS — K7460 Unspecified cirrhosis of liver: Secondary | ICD-10-CM | POA: Diagnosis present

## 2022-12-27 DIAGNOSIS — Z888 Allergy status to other drugs, medicaments and biological substances status: Secondary | ICD-10-CM

## 2022-12-27 DIAGNOSIS — E876 Hypokalemia: Secondary | ICD-10-CM | POA: Diagnosis present

## 2022-12-27 DIAGNOSIS — J440 Chronic obstructive pulmonary disease with acute lower respiratory infection: Principal | ICD-10-CM | POA: Diagnosis present

## 2022-12-27 DIAGNOSIS — Z8249 Family history of ischemic heart disease and other diseases of the circulatory system: Secondary | ICD-10-CM | POA: Diagnosis not present

## 2022-12-27 DIAGNOSIS — F419 Anxiety disorder, unspecified: Secondary | ICD-10-CM | POA: Diagnosis present

## 2022-12-27 DIAGNOSIS — Z7901 Long term (current) use of anticoagulants: Secondary | ICD-10-CM | POA: Diagnosis not present

## 2022-12-27 DIAGNOSIS — J9611 Chronic respiratory failure with hypoxia: Secondary | ICD-10-CM | POA: Diagnosis present

## 2022-12-27 DIAGNOSIS — I5022 Chronic systolic (congestive) heart failure: Secondary | ICD-10-CM | POA: Diagnosis present

## 2022-12-27 DIAGNOSIS — Z87891 Personal history of nicotine dependence: Secondary | ICD-10-CM | POA: Diagnosis not present

## 2022-12-27 DIAGNOSIS — Z86711 Personal history of pulmonary embolism: Secondary | ICD-10-CM | POA: Diagnosis present

## 2022-12-27 DIAGNOSIS — B9789 Other viral agents as the cause of diseases classified elsewhere: Secondary | ICD-10-CM | POA: Diagnosis present

## 2022-12-27 DIAGNOSIS — I132 Hypertensive heart and chronic kidney disease with heart failure and with stage 5 chronic kidney disease, or end stage renal disease: Secondary | ICD-10-CM | POA: Diagnosis present

## 2022-12-27 DIAGNOSIS — I480 Paroxysmal atrial fibrillation: Secondary | ICD-10-CM | POA: Diagnosis present

## 2022-12-27 DIAGNOSIS — J9621 Acute and chronic respiratory failure with hypoxia: Secondary | ICD-10-CM | POA: Diagnosis present

## 2022-12-27 DIAGNOSIS — Z992 Dependence on renal dialysis: Secondary | ICD-10-CM

## 2022-12-27 DIAGNOSIS — J1289 Other viral pneumonia: Secondary | ICD-10-CM | POA: Diagnosis present

## 2022-12-27 DIAGNOSIS — E1151 Type 2 diabetes mellitus with diabetic peripheral angiopathy without gangrene: Secondary | ICD-10-CM | POA: Diagnosis present

## 2022-12-27 DIAGNOSIS — R7989 Other specified abnormal findings of blood chemistry: Secondary | ICD-10-CM | POA: Diagnosis present

## 2022-12-27 DIAGNOSIS — Z89022 Acquired absence of left finger(s): Secondary | ICD-10-CM

## 2022-12-27 DIAGNOSIS — I2699 Other pulmonary embolism without acute cor pulmonale: Secondary | ICD-10-CM | POA: Diagnosis not present

## 2022-12-27 DIAGNOSIS — I517 Cardiomegaly: Secondary | ICD-10-CM | POA: Diagnosis present

## 2022-12-27 DIAGNOSIS — Z79899 Other long term (current) drug therapy: Secondary | ICD-10-CM

## 2022-12-27 DIAGNOSIS — Z9981 Dependence on supplemental oxygen: Secondary | ICD-10-CM | POA: Diagnosis not present

## 2022-12-27 DIAGNOSIS — R9431 Abnormal electrocardiogram [ECG] [EKG]: Secondary | ICD-10-CM | POA: Diagnosis present

## 2022-12-27 DIAGNOSIS — Z1152 Encounter for screening for COVID-19: Secondary | ICD-10-CM

## 2022-12-27 DIAGNOSIS — I428 Other cardiomyopathies: Secondary | ICD-10-CM | POA: Diagnosis present

## 2022-12-27 DIAGNOSIS — E119 Type 2 diabetes mellitus without complications: Secondary | ICD-10-CM | POA: Diagnosis not present

## 2022-12-27 DIAGNOSIS — Z833 Family history of diabetes mellitus: Secondary | ICD-10-CM

## 2022-12-27 DIAGNOSIS — D631 Anemia in chronic kidney disease: Secondary | ICD-10-CM | POA: Diagnosis present

## 2022-12-27 DIAGNOSIS — J206 Acute bronchitis due to rhinovirus: Secondary | ICD-10-CM | POA: Diagnosis not present

## 2022-12-27 DIAGNOSIS — E1122 Type 2 diabetes mellitus with diabetic chronic kidney disease: Secondary | ICD-10-CM | POA: Diagnosis present

## 2022-12-27 DIAGNOSIS — N186 End stage renal disease: Secondary | ICD-10-CM | POA: Diagnosis present

## 2022-12-27 DIAGNOSIS — I2782 Chronic pulmonary embolism: Secondary | ICD-10-CM | POA: Diagnosis present

## 2022-12-27 DIAGNOSIS — J441 Chronic obstructive pulmonary disease with (acute) exacerbation: Secondary | ICD-10-CM | POA: Diagnosis present

## 2022-12-27 DIAGNOSIS — Z8673 Personal history of transient ischemic attack (TIA), and cerebral infarction without residual deficits: Secondary | ICD-10-CM

## 2022-12-27 DIAGNOSIS — M898X9 Other specified disorders of bone, unspecified site: Secondary | ICD-10-CM | POA: Diagnosis present

## 2022-12-27 DIAGNOSIS — R0602 Shortness of breath: Secondary | ICD-10-CM | POA: Diagnosis not present

## 2022-12-27 LAB — HEPATIC FUNCTION PANEL
ALT: 18 U/L (ref 0–44)
AST: 30 U/L (ref 15–41)
Albumin: 2.4 g/dL — ABNORMAL LOW (ref 3.5–5.0)
Alkaline Phosphatase: 85 U/L (ref 38–126)
Bilirubin, Direct: 0.3 mg/dL — ABNORMAL HIGH (ref 0.0–0.2)
Indirect Bilirubin: 0.6 mg/dL (ref 0.3–0.9)
Total Bilirubin: 0.9 mg/dL (ref <1.2)
Total Protein: 7.6 g/dL (ref 6.5–8.1)

## 2022-12-27 LAB — CBC WITH DIFFERENTIAL/PLATELET
Abs Immature Granulocytes: 0.01 10*3/uL (ref 0.00–0.07)
Basophils Absolute: 0 10*3/uL (ref 0.0–0.1)
Basophils Relative: 1 %
Eosinophils Absolute: 0.3 10*3/uL (ref 0.0–0.5)
Eosinophils Relative: 5 %
HCT: 38.9 % — ABNORMAL LOW (ref 39.0–52.0)
Hemoglobin: 11.7 g/dL — ABNORMAL LOW (ref 13.0–17.0)
Immature Granulocytes: 0 %
Lymphocytes Relative: 14 %
Lymphs Abs: 0.7 10*3/uL (ref 0.7–4.0)
MCH: 25.8 pg — ABNORMAL LOW (ref 26.0–34.0)
MCHC: 30.1 g/dL (ref 30.0–36.0)
MCV: 85.7 fL (ref 80.0–100.0)
Monocytes Absolute: 0.7 10*3/uL (ref 0.1–1.0)
Monocytes Relative: 14 %
Neutro Abs: 3.6 10*3/uL (ref 1.7–7.7)
Neutrophils Relative %: 66 %
Platelets: 120 10*3/uL — ABNORMAL LOW (ref 150–400)
RBC: 4.54 MIL/uL (ref 4.22–5.81)
RDW: 16.9 % — ABNORMAL HIGH (ref 11.5–15.5)
WBC: 5.3 10*3/uL (ref 4.0–10.5)
nRBC: 0 % (ref 0.0–0.2)

## 2022-12-27 LAB — I-STAT CHEM 8, ED
BUN: 23 mg/dL — ABNORMAL HIGH (ref 6–20)
Calcium, Ion: 0.97 mmol/L — ABNORMAL LOW (ref 1.15–1.40)
Chloride: 95 mmol/L — ABNORMAL LOW (ref 98–111)
Creatinine, Ser: 4.4 mg/dL — ABNORMAL HIGH (ref 0.61–1.24)
Glucose, Bld: 85 mg/dL (ref 70–99)
HCT: 41 % (ref 39.0–52.0)
Hemoglobin: 13.9 g/dL (ref 13.0–17.0)
Potassium: 3.2 mmol/L — ABNORMAL LOW (ref 3.5–5.1)
Sodium: 138 mmol/L (ref 135–145)
TCO2: 29 mmol/L (ref 22–32)

## 2022-12-27 LAB — BASIC METABOLIC PANEL
Anion gap: 13 (ref 5–15)
BUN: 20 mg/dL (ref 6–20)
CO2: 27 mmol/L (ref 22–32)
Calcium: 8.7 mg/dL — ABNORMAL LOW (ref 8.9–10.3)
Chloride: 95 mmol/L — ABNORMAL LOW (ref 98–111)
Creatinine, Ser: 4.27 mg/dL — ABNORMAL HIGH (ref 0.61–1.24)
GFR, Estimated: 15 mL/min — ABNORMAL LOW (ref >60)
Glucose, Bld: 84 mg/dL (ref 70–99)
Potassium: 3.1 mmol/L — ABNORMAL LOW (ref 3.5–5.1)
Sodium: 135 mmol/L (ref 135–145)

## 2022-12-27 LAB — TROPONIN I (HIGH SENSITIVITY)
Troponin I (High Sensitivity): 69 ng/L — ABNORMAL HIGH (ref <18)
Troponin I (High Sensitivity): 91 ng/L — ABNORMAL HIGH (ref <18)

## 2022-12-27 LAB — BRAIN NATRIURETIC PEPTIDE: B Natriuretic Peptide: 2332.5 pg/mL — ABNORMAL HIGH (ref 0.0–100.0)

## 2022-12-27 MED ORDER — SODIUM CHLORIDE 0.9 % IV SOLN
500.0000 mg | Freq: Once | INTRAVENOUS | Status: AC
  Administered 2022-12-27: 500 mg via INTRAVENOUS
  Filled 2022-12-27: qty 5

## 2022-12-27 MED ORDER — IPRATROPIUM-ALBUTEROL 0.5-2.5 (3) MG/3ML IN SOLN
3.0000 mL | Freq: Once | RESPIRATORY_TRACT | Status: AC
  Administered 2022-12-27: 3 mL via RESPIRATORY_TRACT
  Filled 2022-12-27: qty 3

## 2022-12-27 MED ORDER — PREDNISONE 20 MG PO TABS
60.0000 mg | ORAL_TABLET | Freq: Once | ORAL | Status: AC
  Administered 2022-12-27: 60 mg via ORAL
  Filled 2022-12-27: qty 3

## 2022-12-27 MED ORDER — MELATONIN 3 MG PO TABS
3.0000 mg | ORAL_TABLET | Freq: Every evening | ORAL | Status: DC | PRN
  Administered 2022-12-31: 3 mg via ORAL
  Filled 2022-12-27: qty 1

## 2022-12-27 MED ORDER — ONDANSETRON HCL 4 MG/2ML IJ SOLN: 4.0000 mg | Freq: Four times a day (QID) | INTRAMUSCULAR | Status: DC | PRN

## 2022-12-27 MED ORDER — IOHEXOL 350 MG/ML SOLN
75.0000 mL | Freq: Once | INTRAVENOUS | Status: AC | PRN
  Administered 2022-12-27: 75 mL via INTRAVENOUS

## 2022-12-27 MED ORDER — ACETAMINOPHEN 650 MG RE SUPP: 650.0000 mg | Freq: Four times a day (QID) | RECTAL | Status: DC | PRN

## 2022-12-27 MED ORDER — ACETAMINOPHEN 325 MG PO TABS
650.0000 mg | ORAL_TABLET | Freq: Four times a day (QID) | ORAL | Status: DC | PRN
  Filled 2022-12-27: qty 2

## 2022-12-27 MED ORDER — HEPARIN BOLUS VIA INFUSION
5300.0000 [IU] | Freq: Once | INTRAVENOUS | Status: AC
  Administered 2022-12-27: 5300 [IU] via INTRAVENOUS
  Filled 2022-12-27: qty 5300

## 2022-12-27 MED ORDER — SODIUM CHLORIDE 0.9 % IV SOLN
1.0000 g | Freq: Once | INTRAVENOUS | Status: AC
  Administered 2022-12-27: 1 g via INTRAVENOUS
  Filled 2022-12-27: qty 10

## 2022-12-27 MED ORDER — HEPARIN (PORCINE) 25000 UT/250ML-% IV SOLN
1300.0000 [IU]/h | INTRAVENOUS | Status: DC
  Administered 2022-12-27: 1300 [IU]/h via INTRAVENOUS
  Filled 2022-12-27: qty 250

## 2022-12-27 NOTE — ED Provider Notes (Signed)
[place] EMERGENCY DEPARTMENT AT [facility] Provider Note   CSN: [id number] Arrival date & time: 12/27/22  1408     History  Chief Complaint  Patient presents with   Shortness of Breath    Nathaniel White is a 62 y.o. male, hx of COPD, HFrEF, afib, ESRD, who presents to the ED 2/2 to shortness of breath, and cough that is been going on for the last 2 weeks.  He was given antibiotics about 3 weeks ago for possible pneumonia he states, and he got better after the antibiotics, but after the last few days of antibiotics, he started getting worse.  He reports he has had increased shortness of breath, wet cough, with sputum that was clear, and some chest tightness that started yesterday.  He states it was in the left breast, and felt just very tight.  Denies any nausea, vomiting, bloody sputum, or fevers or chills.  Is on 2.5 L of O2 at baseline Home Medications Prior to Admission medications   Medication Sig Start Date End Date Taking? Authorizing Provider  acetaminophen (TYLENOL) 500 MG tablet Take 1 tablet (500 mg total) by mouth every 6 (six) hours as needed. Patient taking differently: Take 500 mg by mouth every 6 (six) hours as needed for mild pain (pain score 1-3). 04/11/18  Yes Burky, Barron Alvine, NP  albuterol (PROAIR HFA) 108 (90 Base) MCG/ACT inhaler Inhale 2 puffs into the lungs every 6 (six) hours as needed for wheezing or shortness of breath. 11/30/22  Yes Rai, Ripudeep K, MD  amiodarone (PACERONE) 200 MG tablet Take 1 tablet (200 mg total) by mouth daily. 09/25/22  Yes Pray, Milus Mallick, MD  ELIQUIS 5 MG TABS tablet Take 5 mg by mouth 2 (two) times daily. 12/09/21  Yes [provider]  midodrine (PROAMATINE) 10 MG tablet Take 1 tablet (10 mg total) by mouth 3 (three) times daily with meals. Patient taking differently: Take 10 mg by mouth See admin instructions. TAKE 1 TABLET BY MOUTH THREE TIMES A WEEK. TAKE 30 MINUTES BEFORE HEMODIALYSIS. TAKE 1 EXTRA TABLET MID  TREATMENT 09/24/22  Yes Alfredo Martinez, MD  polyethylene glycol (MIRALAX) 17 g packet Take 17 g by mouth daily as needed for moderate constipation or mild constipation. Also available over-the-counter 11/30/22  Yes Rai, Ripudeep K, MD  senna-docusate (SENOKOT-S) 8.6-50 MG tablet Take 2 tablets by mouth at bedtime. For constipation Patient taking differently: Take 2 tablets by mouth at bedtime as needed for moderate constipation. For constipation 11/30/22  Yes Rai, Ripudeep K, MD  traZODone (DESYREL) 50 MG tablet Take 50 mg by mouth at bedtime as needed for sleep.   Yes [provider]  VELPHORO 500 MG chewable tablet Chew 1,000 mg by mouth 2 (two) times daily after a meal. 02/06/21  Yes [provider]  VISINE 0.05 % ophthalmic solution Place 2-3 drops into both eyes 2 (two) times daily as needed (dry eye). 07/04/21  Yes [provider]  albuterol (PROVENTIL) (2.5 MG/3ML) 0.083% nebulizer solution Take 3 mLs (2.5 mg total) by nebulization every 6 (six) hours as needed for wheezing or shortness of breath. Patient not taking: Reported on 11/29/2022 08/25/21   Hoy Register, MD  mometasone-formoterol (DULERA) 200-5 MCG/ACT AERO Inhale 2 puffs into the lungs 2 (two) times daily. Patient not taking: Reported on 12/27/2022 11/30/22   Rai, Delene Ruffini, MD  Tenapanor HCl, CKD, (XPHOZAH) 30 MG TABS Take 30 mg by mouth daily. Patient not taking: Reported on 12/27/2022  [provider]      Allergies    Codeine, Dextromethorphan-guaifenesin, Iron dextran, and Morphine and codeine    Review of Systems   Review of Systems  Constitutional:  Negative for fever.  Respiratory:  Positive for cough and shortness of breath.   Cardiovascular:  Positive for chest pain.    Physical Exam Updated Vital Signs BP 134/78 (BP Location: Right Arm)   Pulse 83   Temp 98.8 F (37.1 C) (Oral)   Resp 17   Ht 5\' 9"  (1.753 m)   Wt 77.1 kg   SpO2 97%   BMI 25.10 kg/m  Physical  Exam Vitals and nursing note reviewed.  Constitutional:      General: He is not in acute distress.    Appearance: He is well-developed.  HENT:     Head: Normocephalic and atraumatic.  Eyes:     Conjunctiva/sclera: Conjunctivae normal.  Cardiovascular:     Rate and Rhythm: Normal rate and regular rhythm.     Heart sounds: No murmur heard. Pulmonary:     Effort: Pulmonary effort is normal. No respiratory distress.     Breath sounds: Examination of the right-lower field reveals wheezing. Examination of the left-lower field reveals wheezing. Wheezing present.     Comments: Wheezes of bilateral lower lobes, nonlabored respirations, wet hacking cough. Abdominal:     Palpations: Abdomen is soft.     Tenderness: There is no abdominal tenderness.  Musculoskeletal:        General: No swelling.     Cervical back: Neck supple.  Skin:    General: Skin is warm and dry.     Capillary Refill: Capillary refill takes less than 2 seconds.  Neurological:     Mental Status: He is alert.  Psychiatric:        Mood and Affect: Mood normal.     ED Results / Procedures / Treatments   Labs (all labs ordered are listed, but only abnormal results are displayed) Labs Reviewed  BASIC METABOLIC PANEL - Abnormal; Notable for the following components:      Result Value   Potassium 3.1 (*)    Chloride 95 (*)    Creatinine, Ser 4.27 (*)    Calcium 8.7 (*)    GFR, Estimated 15 (*)    All other components within normal limits  CBC WITH DIFFERENTIAL/PLATELET - Abnormal; Notable for the following components:   Hemoglobin 11.7 (*)    HCT 38.9 (*)    MCH 25.8 (*)    RDW 16.9 (*)    Platelets 120 (*)    All other components within normal limits  BRAIN NATRIURETIC PEPTIDE - Abnormal; Notable for the following components:   B Natriuretic Peptide 2,332.5 (*)    All other components within normal limits  HEPATIC FUNCTION PANEL - Abnormal; Notable for the following components:   Albumin 2.4 (*)    Bilirubin,  Direct 0.3 (*)    All other components within normal limits  I-STAT CHEM 8, ED - Abnormal; Notable for the following components:   Potassium 3.2 (*)    Chloride 95 (*)    BUN 23 (*)    Creatinine, Ser 4.40 (*)    Calcium, Ion 0.97 (*)    All other components within normal limits  TROPONIN I (HIGH SENSITIVITY) - Abnormal; Notable for the following components:   Troponin I (High Sensitivity) 69 (*)    All other components within normal limits  TROPONIN I (HIGH SENSITIVITY) - Abnormal; Notable for the  following components:   Troponin I (High Sensitivity) 91 (*)    All other components within normal limits    EKG None  Radiology CT Angio Chest PE W and/or Wo Contrast Result Date: 12/27/2022 CLINICAL DATA:  Shortness of breath, worsening after antibiotics, cough EXAM: CT ANGIOGRAPHY CHEST WITH CONTRAST TECHNIQUE: Multidetector CT imaging of the chest was performed using the standard protocol during bolus administration of intravenous contrast. Multiplanar CT image reconstructions and MIPs were obtained to evaluate the vascular anatomy. RADIATION DOSE REDUCTION: This exam was performed according to the departmental dose-optimization program which includes automated exposure control, adjustment of the mA and/or kV according to patient size and/or use of iterative reconstruction technique. CONTRAST:  75mL OMNIPAQUE IOHEXOL 350 MG/ML SOLN COMPARISON:  Same day chest radiograph and CTA chest 09/04/2021 FINDINGS: Cardiovascular: Filling defect in a posterior right lower lobe segmental artery (series 6/image 153). This is favored to represent a acute pulmonary embolism there is extensive mixing artifact in the right lower lobe pulmonary arteries. Mild cardiomegaly. Coronary artery and aortic atherosclerotic calcification. No evidence of right heart strain. No pericardial effusion. Reflux of contrast into the IVC and hepatic veins compatible with elevated right heart pressures. Dilated main pulmonary  artery measuring 34 mm. Mediastinum/Nodes: Trachea and esophagus are unremarkable. Similar mediastinal adenopathy. For example an unchanged 1.2 cm right pretracheal node (6/87). Lungs/Pleura: Interlobular septal thickening and patchy ground-glass opacities with more confluence airspace opacities in the lower lobes. This is similar to 09/04/2021. Right fibrothorax and trace effusion. Josanne Boerema left effusion in the apex. Upper Abdomen: Nodular hepatic contour compatible with cirrhosis. Cholelithiasis. Polycystic kidneys. Musculoskeletal: No acute fracture. Review of the MIP images confirms the above findings. IMPRESSION: 1. Acute nonocclusive pulmonary embolism in a posterior right lower lobe segmental artery. Alternatively this could represent mixing artifact. No evidence of right heart strain. 2. Chronic lung disease similar to 09/04/2021. 3. Marked cardiomegaly and elevated right heart pressures. 4. Dilated main pulmonary artery can be seen with pulmonary arterial hypertension 5. Cirrhosis. Aortic Atherosclerosis (ICD10-I70.0). Electronically Signed   By: Minerva Fester M.D.   On: 12/27/2022 22:41   DG Chest 2 View Result Date: 12/27/2022 CLINICAL DATA:  Shortness of breath, worsening after antibiotics. Cough EXAM: CHEST - 2 VIEW COMPARISON:  11/29/2022 FINDINGS: Stable cardiomegaly.  Aortic atherosclerotic calcification. Left IJ CVC tip in the right atrium. Bilateral airspace and interstitial opacities slightly improved from 11/29/2022. Shamere Dilworth bilateral pleural effusions. No pneumothorax. IMPRESSION: Bilateral airspace and interstitial opacities may be due to edema or infection. Tiearra Colwell pleural effusions. Electronically Signed   By: Minerva Fester M.D.   On: 12/27/2022 19:39    Procedures Procedures    Medications Ordered in ED Medications  azithromycin (ZITHROMAX) 500 mg in sodium chloride 0.9 % 250 mL IVPB (500 mg Intravenous New Bag/Given 12/27/22 2244)  ipratropium-albuterol (DUONEB) 0.5-2.5 (3)  MG/3ML nebulizer solution 3 mL (3 mLs Nebulization Given 12/27/22 2127)  cefTRIAXone (ROCEPHIN) 1 g in sodium chloride 0.9 % 100 mL IVPB (0 g Intravenous Stopped 12/27/22 2212)  iohexol (OMNIPAQUE) 350 MG/ML injection 75 mL (75 mLs Intravenous Contrast Given 12/27/22 2023)  predniSONE (DELTASONE) tablet 60 mg (60 mg Oral Given 12/27/22 2317)    ED Course/ Medical Decision Making/ A&P                                 Medical Decision Making Patient is a 62 year old male, history of ESRD, here for cough, increased  sputum, and shortness of breath, that is been going on for last 2 weeks.  He states he was recently admitted to the hospital about 3 or 4 weeks ago, and had a dose of antibiotics, and felt much better afterwards, but then started progressing again.  He states he is having difficult time catching his breath, as of lately, but denies any increased oxygen demand, at home.  He has wheezes to bilateral lower lobes, and has a hacking cough.  He does not appear to be fluid overloaded.  And has been compliant with his dialysis.  He has not missed any doses of his Eliquis either.  Given his past history of pneumonia/history, we will obtain a CTA of his chest, to evaluate for worsening infection, or other abnormalities.  Will start him on a DuoNeb, and prednisone for COPD exacerbation at this time.  Also complains of some slight chest pain, thus troponins ordered  Amount and/or Complexity of Data Reviewed Labs: ordered.    Details: Troponin slightly elevated, from 69-91, no leukocytosis, BNP elevated Radiology: ordered.    Details: Chest x-ray shows bilateral opacities possibly due to infection versus edema, CTA shows new acute pulmonary embolism Discussion of management or test interpretation with external provider(s): Patient has been compliant with Eliquis, however has a new acute pulmonary embolism, this warrants admission.  Started on heparin.  Initially treated with antibiotics due to high  concern of infection, given recent admission for this, but CTA does not show any evidence of infection.  This may all be secondary to his heart failure, and COPD exacerbation.  He does not appear to be fluid overloaded at this time.  COPD exacerbation more likely thought to be the cause.  Improved after DuoNeb, prednisone.  Will admit to hospitalist, for further management. Admitted to Dr. Arlean Hopping  Risk Prescription drug management. Decision regarding hospitalization.    Final Clinical Impression(s) / ED Diagnoses Final diagnoses:  Other acute pulmonary embolism, unspecified whether acute cor pulmonale present (HCC)  COPD exacerbation [facility])    Rx / DC Orders ED Discharge Orders     None         Jericka Kadar Elbert Ewings, PA 12/27/22 2351    Anders Simmonds T, DO 12/28/22 1718

## 2022-12-27 NOTE — H&P (Signed)
History and Physical      Nathaniel White ZOX:[id number] DOB: 22-Jun-1960 DOA: 12/27/2022; DOS: 12/27/2022  PCP: Claiborne Rigg, NP  Patient coming from: home   I have personally briefly reviewed patient's old medical records in [facility]  Chief Complaint: Shortness of breath  HPI: Nathaniel White is a 62 y.o. male with medical history significant for paroxysmal atrial fibrillation chronically anticoagulated on Eliquis, incisional disease on hemodialysis on Tuesday, Thursday, Saturday schedule, chronic hypoxic respiratory failure on continuous 2 L nasal cannula, COPD, type 2 diabetes mellitus, systolic heart failure, chronic elevated troponin, who is admitted to [facility] on 12/27/2022 with acute pulmonary embolism after presenting from home to [facility] ED complaining of shortness of breath.   The patient was hospitalized from 11/29/2022 to 11/30/2022 within the [facility] health system for suspected community-acquired pneumonia complicated by acute COPD exacerbation.  He reports that his respiratory status improved to baseline with interval antibiotics.  However, over the last 3 to 4 days, he notes progressive shortness of breath associate with new onset cough as well as wheezing.  He denies any associated chest pain, Oxis, or new lower extremity erythema/calf tenderness.  No recent orthopnea, PND, or worsening peripheral edema.  No recent palpitations, diaphoresis, nausea, vomiting, dizziness, presyncope, or syncope.  He also denies any recent subjective fever, chills, rigors, or generalized myalgias.  He conveys a distant history of pulmonary emboli, and also a history of paroxysmal atrial fibrillation for which he reports outstanding compliance with his chronic anticoagulation on Eliquis, without any recently missed doses thereafter.  Aside from the aforementioned recent hospitalization last month, he denies any recent periods of diminished ambulatory status nor any recent trauma,  travel, surgical procedures, and no known underlying history of malignancy.    ED Course:  Vital signs in the ED were notable for the following: Afebrile; heart rates in the 60s to 80s; systolic blood pressures in the low 100s to 130s; respiratory rate 17-20, oxygen saturation 92 to 97% on his baseline 2 L continuous nasal cannula.  Labs were notable for the following: CMP notable for the following: Sodium 135, potassium 3.1, bicarbonate 27, glucose 84, calcium adjusted for mild hypoalbuminemia noted to be 9.9, albumin 2.4.  Otherwise, liver enzymes were within normal limits.  BNP 2300 compared to 1000 470 17 June 2022 and compared to 3500 in September 2023, initial high sensitive troponin I was 69, with repeat value trending up slightly to 91, which is relative to most recent prior high-sensitivity troponin I did point of 100 on 09/17/2022.  CBC notable for what was about 5300, hemoglobin 11.7 associated with normocytic property and relative to hemoglobin level of 10.2 on 11/30/2022.  Per my interpretation, EKG in ED demonstrated the following: Sinus rhythm with first-degree AV block, heart rate 67, prolonged QTc of 612, no evidence of T wave or ST changes, Cleen evidence of ST elevation.  Imaging in the ED, per corresponding formal radiology read, was notable for the following: CTA chest with PE protocol showed evidence of acute pulmonary embolism in the posterior right lower lobe, without CT evidence of right heart strain, will also showing no evidence of infiltrate, edema, effusion, or pneumothorax.  While in the ED, the following were administered: Heparin bolus followed by initiation of heparin drip, nebulizer treatment x 1, prednisone 60 mg p.o. x 1, azithromycin, Rocephin.  Subsequently, the patient was admitted for further evaluation management presenting acute pulmonary embolism, complicated by acute COPD exacerbation, presenting labs notable for  hypokalemia, and additional findings notable for  prolonged QTc.    Review of Systems: As per HPI otherwise 10 point review of systems negative.   Past Medical History:  Diagnosis Date   Anemia    Anxiety    Asthma    Complication from renal dialysis device 11/02/2013   Complication of anesthesia 2017   according to pt and spouse pt was moving around and cough while under and pt had difficulty waking up so the anesthesia had to be reversed. and pt admitted to ICU.    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/2008   Qualifier: Diagnosis of  By: Russella Dar MD Marylu Lund    ESRD    on HD, T-TH-Sat - [place]   Hemiparesis due to old stroke [facility])    left   HFrEF (heart failure with reduced ejection fraction) (HCC)    NICM // TTE 06/2022: EF 25-30, sever RV dysfunction, mod to severe MR   Hypertension    Hx, not current problems, no meds   LV dysfunction    EF 25-30% by echo 07/2011   Memory loss due to medical condition    due to stroke   MR (mitral regurgitation)    moderate to severe, echo 07/2011   Nausea with vomiting, unspecified 06/15/2021   Paroxysmal atrial fibrillation (HCC)    Peripheral vascular disease (HCC)    Shortness of breath    with exertion   Stroke (HCC)    TIA's-left sided weakness   Tobacco abuse     Past Surgical History:  Procedure Laterality Date   ABDOMINAL AORTOGRAM W/LOWER EXTREMITY Bilateral 07/21/2018   Procedure: ABDOMINAL AORTOGRAM W/LOWER EXTREMITY;  Surgeon: Maeola Harman, MD;  Location: [facility] INVASIVE CV LAB;  Service: Cardiovascular;  Laterality: Bilateral;   AMPUTATION Left 11/05/2018   Procedure: AMPUTATION SECOND TOE LEFT FOOT;  Surgeon: Maeola Harman, MD;  Location: [facility] OR;  Service: Vascular;  Laterality: Left;   AMPUTATION Left 02/13/2021    Procedure: Left small finger AMPUTATION DIGIT;  Surgeon: Marlyne Beards, MD;  Location: [facility] OR;  Service: Orthopedics;  Laterality: Left;   AV FISTULA PLACEMENT     CARDIAC CATHETERIZATION     [facility]   COLONOSCOPY W/ BIOPSIES AND POLYPECTOMY     FISTULA SUPERFICIALIZATION Left 11/10/2013   Procedure: FISTULA PLICATION;  Surgeon: Nada Libman, MD;  Location: [facility] OR;  Service: Vascular;  Laterality: Left;   INSERTION OF DIALYSIS CATHETER N/A 06/20/2021   Procedure: INSERTION OF DIALYSIS CATHETER;  Surgeon: Victorino Sparrow, MD;  Location: [facility] OR;  Service: Vascular;  Laterality: N/A;   KIDNEY TRANSPLANT     2011 rejected kidney 2012 back on dialysis   LEFT AND RIGHT HEART CATHETERIZATION WITH CORONARY ANGIOGRAM N/A 10/09/2013   Procedure: LEFT AND RIGHT HEART CATHETERIZATION WITH CORONARY ANGIOGRAM;  Surgeon: Kathleene Hazel, MD;  Location: [facility] CATH LAB;  Service: Cardiovascular;  Laterality: N/A;   LIGATION OF ARTERIOVENOUS  FISTULA Left 06/20/2021   Procedure: LIGATION OF ARTERIOVENOUS  FISTULA;  Surgeon: Victorino Sparrow, MD;  Location: [facility] OR;  Service: Vascular;  Laterality: Left;   PERIPHERAL VASCULAR INTERVENTION Left 07/21/2018   Procedure: PERIPHERAL VASCULAR INTERVENTION;  Surgeon: Maeola Harman, MD;  Location: [facility] INVASIVE CV LAB;  Service: Cardiovascular;  Laterality: Left;  Popliteal   REVISON OF ARTERIOVENOUS FISTULA Left 08/26/2013   Procedure: EXCISION OF ERODED SKIN AND EXPLORATION OF MAIN LEFT UPPER ARM AV FISTULA;  Surgeon: Larina Earthly, MD;  Location: [facility] OR;  Service: Vascular;  Laterality: Left;   REVISON OF ARTERIOVENOUS FISTULA Left 02/05/2014   Procedure: REPAIR OF ARTERIOVENOUS FISTULA ANEURYSM;  Surgeon: Nada Libman, MD;  Location: [facility] OR;  Service: Vascular;  Laterality: Left;   REVISON OF ARTERIOVENOUS FISTULA Left 03/10/2021   Procedure: LEFT ARM REVISION OF ARTERIOVENOUS FISTULA WITH PLICATION;  Surgeon: Maeola Harman, MD;   Location: [facility] OR;  Service: Vascular;  Laterality: Left;   RIGHT/LEFT HEART CATH AND CORONARY ANGIOGRAPHY N/A 06/21/2022   Procedure: RIGHT/LEFT HEART CATH AND CORONARY ANGIOGRAPHY;  Surgeon: Lennette Bihari, MD;  Location: [facility] INVASIVE CV LAB;  Service: Cardiovascular;  Laterality: N/A;   SCROTAL EXPLORATION N/A 09/16/2022   Procedure: PENILE DEBRIDMENT;  Surgeon: Rene Paci, MD;  Location: [facility] OR;  Service: Urology;  Laterality: N/A;   SHUNTOGRAM N/A 11/05/2012   Procedure: Fistulogram;  Surgeon: Nada Libman, MD;  Location: [facility] CATH LAB;  Service: Cardiovascular;  Laterality: N/A;   TEE WITHOUT CARDIOVERSION  08/17/2011   Procedure: TRANSESOPHAGEAL ECHOCARDIOGRAM (TEE);  Surgeon: Laurey Morale, MD;  Location: [facility] ENDOSCOPY;  Service: Cardiovascular;  Laterality: N/A;   ULTRASOUND GUIDANCE FOR VASCULAR ACCESS  06/20/2021   Procedure: ULTRASOUND GUIDANCE FOR VASCULAR ACCESS;  Surgeon: Victorino Sparrow, MD;  Location: [facility] OR;  Service: Vascular;;   VIDEO ASSISTED THORACOSCOPY (VATS)/DECORTICATION  08/10/11    Social History:  reports that he quit smoking about 10 years ago. His smoking use included cigarettes. He started smoking about 25 years ago. He has a 7.5 pack-year smoking history. He has never used smokeless tobacco. He reports that he does not drink alcohol and does not use drugs.   Allergies  Allergen Reactions   Codeine Shortness Of Breath   Dextromethorphan-Guaifenesin Shortness Of Breath and Other (See Comments)   Iron Dextran Shortness Of Breath and Other (See Comments)   Morphine And Codeine Shortness Of Breath    Other Reaction(s): Other (See Comments)    Family History  Problem Relation Age of Onset   Hypertension Mother    Varicose Veins Mother    Diabetes Paternal Grandmother    Cancer Paternal Grandfather    CAD Paternal Uncle     Family history reviewed and not pertinent .    Prior to Admission medications   Medication Sig Start Date End Date Taking?  Authorizing Provider  acetaminophen (TYLENOL) 500 MG tablet Take 1 tablet (500 mg total) by mouth every 6 (six) hours as needed. Patient taking differently: Take 500 mg by mouth every 6 (six) hours as needed for mild pain (pain score 1-3). 04/11/18  Yes Burky, Barron Alvine, NP  albuterol (PROAIR HFA) 108 (90 Base) MCG/ACT inhaler Inhale 2 puffs into the lungs every 6 (six) hours as needed for wheezing or shortness of breath. 11/30/22  Yes Rai, Ripudeep K, MD  amiodarone (PACERONE) 200 MG tablet Take 1 tablet (200 mg total) by mouth daily. 09/25/22  Yes Pray, Milus Mallick, MD  ELIQUIS 5 MG TABS tablet Take 5 mg by mouth 2 (two) times daily. 12/09/21  Yes [provider]  midodrine (PROAMATINE) 10 MG tablet Take 1 tablet (10 mg total) by mouth 3 (three) times daily with meals. Patient taking differently: Take 10 mg by mouth See admin instructions. TAKE 1  TABLET BY MOUTH THREE TIMES A WEEK. TAKE 30 MINUTES BEFORE HEMODIALYSIS. TAKE 1 EXTRA TABLET MID TREATMENT 09/24/22  Yes Alfredo Martinez, MD  polyethylene glycol (MIRALAX) 17 g packet Take 17 g by mouth daily as needed for moderate constipation or mild constipation. Also available over-the-counter 11/30/22  Yes Rai, Ripudeep K, MD  senna-docusate (SENOKOT-S) 8.6-50 MG tablet Take 2 tablets by mouth at bedtime. For constipation Patient taking differently: Take 2 tablets by mouth at bedtime as needed for moderate constipation. For constipation 11/30/22  Yes Rai, Ripudeep K, MD  traZODone (DESYREL) 50 MG tablet Take 50 mg by mouth at bedtime as needed for sleep.   Yes [provider]  VELPHORO 500 MG chewable tablet Chew 1,000 mg by mouth 2 (two) times daily after a meal. 02/06/21  Yes [provider]  VISINE 0.05 % ophthalmic solution Place 2-3 drops into both eyes 2 (two) times daily as needed (dry eye). 07/04/21  Yes [provider]  albuterol (PROVENTIL) (2.5 MG/3ML) 0.083% nebulizer solution Take 3 mLs (2.5 mg total) by  nebulization every 6 (six) hours as needed for wheezing or shortness of breath. Patient not taking: Reported on 11/29/2022 08/25/21   Hoy Register, MD  mometasone-formoterol (DULERA) 200-5 MCG/ACT AERO Inhale 2 puffs into the lungs 2 (two) times daily. Patient not taking: Reported on 12/27/2022 11/30/22   Rai, Delene Ruffini, MD  Tenapanor HCl, CKD, (XPHOZAH) 30 MG TABS Take 30 mg by mouth daily. Patient not taking: Reported on 12/27/2022    [provider]     Objective    Physical Exam: Vitals:   12/27/22 2200 12/27/22 2215 12/27/22 2244 12/27/22 2317  BP: 108/60 (!) 134/99 134/78   Pulse:   83   Resp: 19 20 17    Temp:    98.8 F (37.1 C)  TempSrc:    Oral  SpO2:   97%   Weight:      Height:        General: appears to be stated age; alert, oriented Skin: warm, dry, no rash Head:  AT/[place] Mouth:  Oral mucosa membranes appear moist, normal dentition Neck: supple; trachea midline Heart:  RRR; did not appreciate any M/R/G Lungs: CTAB, did not appreciate any wheezes, rales, or rhonchi Abdomen: + BS; soft, ND, NT Vascular: 2+ pedal pulses b/l; 2+ radial pulses b/l Extremities: no peripheral edema, no muscle wasting Neuro: strength and sensation intact in upper and lower extremities b/l    Labs on Admission: I have personally reviewed following labs and imaging studies  CBC: Recent Labs  Lab 12/27/22 1442 12/27/22 1453  WBC 5.3  --   NEUTROABS 3.6  --   HGB 11.7* 13.9  HCT 38.9* 41.0  MCV 85.7  --   PLT 120*  --    Basic Metabolic Panel: Recent Labs  Lab 12/27/22 1442 12/27/22 1453  NA 135 138  K 3.1* 3.2*  CL 95* 95*  CO2 27  --   GLUCOSE 84 85  BUN 20 23*  CREATININE 4.27* 4.40*  CALCIUM 8.7*  --    GFR: Estimated Creatinine Clearance: 17.9 mL/min (A) (by C-G formula based on SCr of 4.4 mg/dL (H)). Liver Function Tests: Recent Labs  Lab 12/27/22 1602  AST 30  ALT 18  ALKPHOS 85  BILITOT 0.9  PROT 7.6  ALBUMIN 2.4*   No results for  input(s): "LIPASE", "AMYLASE" in the last 168 hours. No results for input(s): "AMMONIA" in the last 168 hours. Coagulation Profile: No results for  input(s): "INR", "PROTIME" in the last 168 hours. Cardiac Enzymes: No results for input(s): "CKTOTAL", "CKMB", "CKMBINDEX", "TROPONINI" in the last 168 hours. BNP (last 3 results) No results for input(s): "PROBNP" in the last 8760 hours. HbA1C: No results for input(s): "HGBA1C" in the last 72 hours. CBG: No results for input(s): "GLUCAP" in the last 168 hours. Lipid Profile: No results for input(s): "CHOL", "HDL", "LDLCALC", "TRIG", "CHOLHDL", "LDLDIRECT" in the last 72 hours. Thyroid Function Tests: No results for input(s): "TSH", "T4TOTAL", "FREET4", "T3FREE", "THYROIDAB" in the last 72 hours. Anemia Panel: No results for input(s): "VITAMINB12", "FOLATE", "FERRITIN", "TIBC", "IRON", "RETICCTPCT" in the last 72 hours. Urine analysis:    Component Value Date/Time   COLORURINE YELLOW 02/13/2010 0703   APPEARANCEUR CLOUDY (A) 02/13/2010 0703   LABSPEC 1.013 02/13/2010 0703   PHURINE 8.5 (H) 02/13/2010 0703   GLUCOSEU NEGATIVE 12/18/2009 0546   HGBUR LARGE (A) 02/13/2010 0703   BILIRUBINUR NEGATIVE 02/13/2010 0703   KETONESUR NEGATIVE 02/13/2010 0703   PROTEINUR >300 (A) 02/13/2010 0703   UROBILINOGEN 0.2 02/13/2010 0703   NITRITE NEGATIVE 02/13/2010 0703   LEUKOCYTESUR NEGATIVE 02/13/2010 0703    Radiological Exams on Admission: CT Angio Chest PE W and/or Wo Contrast Result Date: 12/27/2022 CLINICAL DATA:  Shortness of breath, worsening after antibiotics, cough EXAM: CT ANGIOGRAPHY CHEST WITH CONTRAST TECHNIQUE: Multidetector CT imaging of the chest was performed using the standard protocol during bolus administration of intravenous contrast. Multiplanar CT image reconstructions and MIPs were obtained to evaluate the vascular anatomy. RADIATION DOSE REDUCTION: This exam was performed according to the departmental dose-optimization  program which includes automated exposure control, adjustment of the mA and/or kV according to patient size and/or use of iterative reconstruction technique. CONTRAST:  75mL OMNIPAQUE IOHEXOL 350 MG/ML SOLN COMPARISON:  Same day chest radiograph and CTA chest 09/04/2021 FINDINGS: Cardiovascular: Filling defect in a posterior right lower lobe segmental artery (series 6/image 153). This is favored to represent a acute pulmonary embolism there is extensive mixing artifact in the right lower lobe pulmonary arteries. Mild cardiomegaly. Coronary artery and aortic atherosclerotic calcification. No evidence of right heart strain. No pericardial effusion. Reflux of contrast into the IVC and hepatic veins compatible with elevated right heart pressures. Dilated main pulmonary artery measuring 34 mm. Mediastinum/Nodes: Trachea and esophagus are unremarkable. Similar mediastinal adenopathy. For example an unchanged 1.2 cm right pretracheal node (6/87). Lungs/Pleura: Interlobular septal thickening and patchy ground-glass opacities with more confluence airspace opacities in the lower lobes. This is similar to 09/04/2021. Right fibrothorax and trace effusion. Small left effusion in the apex. Upper Abdomen: Nodular hepatic contour compatible with cirrhosis. Cholelithiasis. Polycystic kidneys. Musculoskeletal: No acute fracture. Review of the MIP images confirms the above findings. IMPRESSION: 1. Acute nonocclusive pulmonary embolism in a posterior right lower lobe segmental artery. Alternatively this could represent mixing artifact. No evidence of right heart strain. 2. Chronic lung disease similar to 09/04/2021. 3. Marked cardiomegaly and elevated right heart pressures. 4. Dilated main pulmonary artery can be seen with pulmonary arterial hypertension 5. Cirrhosis. Aortic Atherosclerosis (ICD10-I70.0). Electronically Signed   By: Minerva Fester M.D.   On: 12/27/2022 22:41   DG Chest 2 View Result Date: 12/27/2022 CLINICAL DATA:   Shortness of breath, worsening after antibiotics. Cough EXAM: CHEST - 2 VIEW COMPARISON:  11/29/2022 FINDINGS: Stable cardiomegaly.  Aortic atherosclerotic calcification. Left IJ CVC tip in the right atrium. Bilateral airspace and interstitial opacities slightly improved from 11/29/2022. Small bilateral pleural effusions. No pneumothorax. IMPRESSION: Bilateral airspace and interstitial opacities  may be due to edema or infection. Small pleural effusions. Electronically Signed   By: Minerva Fester M.D.   On: 12/27/2022 19:39      Assessment/Plan   Principal Problem:   Acute pulmonary embolism (HCC) Active Problems:   Paroxysmal atrial fibrillation (HCC)   DM2 (diabetes mellitus, type 2) (HCC)   COPD with acute exacerbation (HCC)   SOB (shortness of breath)   End-stage renal disease on hemodialysis (HCC)   Hypokalemia   Prolonged QT interval   Chronic hypoxic respiratory failure (HCC)   Chronic systolic CHF (congestive heart failure) (HCC)   Elevated troponin     #) Acute Pulmonary Embolism: In the context of presenting 3-4 days of sob, with CTA chest showing evidence of acute pulmonary embolism involving the posterior right lower lobe, without radiographic evidence to suggest right heart strain.  No evidence of acute hypoxia relative to patient's chronic hypoxic respiratory failure on baseline 2 L continuous nasal cannula.  Additionally, no evidence of hypotension to warrant consideration for tPA administration.  This is in the context of a reported history of multiple prior pulmonary emboli and occurred in spite of interval good compliance with chronic anticoagulation on Eliquis, raising the possibility of failure to anticoagulation on Eliquis.  He was recently hospitalized, raising the possibility of provoking features from relative decline in ambulatory status, but otherwise no overt provoking factors identified at this time.  Will started on heparin drip in the ED this evening.    Plan: Continue heparin drip.  Hold home Eliquis for now. Monitor on telemetry. Monitor for development of hypotension.  Recheck CBC in the morning.  Echocardiogram ordered for the morning.  Venous Dopplers of the bilateral lower extremities.                  #) Acute COPD exacerbation: in the context of a documented history of COPD, diagnosis of acute exacerbation on the basis of 3 to 4 days of progressive shortness of breath associate with wheezing, with presenting CTA chest showing evidence of acute pulmonary embolism, but no evidence of infiltrate, edema, effusion, or pneumothorax .  Suspect contribution towards acute COPD exacerbation as a sequela of physiologic stress from presenting acute pulmonary embolism.  Patient maintaining oxygen saturations of 92 to 97% on his baseline 2 L continuous nasal cannula. Outpatient respiratory regimen appears to include as needed albuterol inhaler, in the absence of any scheduled breathing treatments.  This is in the context of the patient reporting that he is a former smoker.  He received prednisone 60 mg p.o. x 1 dose this evening.  Plan: Monitor on telemetry. Solumedrol. Scheduled duonebs q6 hours. Prn albuterol inhaler.  CMP in the morning. Repeat CBC in the morning. Check serum Mg and Phos levels. Will attempt additional chart review to evaluate most recent PFT results. Will start doxycycline for benefit of shortened duration of hospitalization associated with antibiotic initiation in the setting of acute COPD exacerbation, noting preference for this antibiotic option in the setting of QTc prolongation. check blood gas.                     #) Hypokalemia: presenting potassium level noted to be 3.1.  In the setting of his history of end-stage renal disease on hemodialysis, we will refrain from aggressive potassium supplementation for now, very rather follow for updated serum potassium result with morning CMP.  Plan: monitor on  tele. Add-on serum mag level. CMP, mag level in the AM.                   #)  QTc prolongation: Presenting EKG demonstrates QTc of 612 ms. outpatient medications that may be contributing to QTc prolongation: Prn albuterol.  Of note, the patient received azithromycin this evening in the setting of acute COPD exacerbation, will convert this to doxycycline to limit pharmacologic contribution towards QTc prolongation.  Of note, presenting serum magnesium level 1.8.  Plan: Monitor on telemetry.  Repeat EKG in the morning to monitor interval degree of QTc prolongation.  Change existing azithromycin to doxycycline, as above.  Repeat serum magnesium level in the morning.                  #) Paroxysmal atrial fibrillation: Documented history of such. In setting of CHA2DS2-VASc score of 2, there is an indication for chronic anticoagulation for thromboembolic prophylaxis. Consistent with this, patient is chronically anticoagulated on Eliquis.  However, in spite of ascending compliance with chronic anticoagulation on Eliquis, the patient presents with acute pulmonary embolism, raising the possibility of failure of outpatient Eliquis.  In this setting, he has been started on heparin drip for now.  Home AV nodal blocking regimen: None.  On chronic oral amiodarone therapy.  Most recent echocardiogram was performed in the form of the limited echo in June 2020 forearms notable for severely dilated left atrium as well as moderate to severe mitral regurgitation, with additional results as conveyed below.  Presenting EKG sinus rhythm without overt evidence of acute ischemic changes..   Plan: monitor strict I's & O's and daily weights. CMP/CBC in AM. Check serum mag level. Continue home amiodarone.  Holding home Eliquis for now and lieu of heparin drip, as above.  Monitor on telemetry.  Follow for result of updated echocardiogram in the morning, as above.                  #) ESRD:  on HD (schedule: Tuesday, Thursday, Saturday). Next due for routine HD on Saturday, 12/29/2022. No clinical evidence for urgent overnight HD or to expedite HD relative to this timeframe, including no evidence of hyperkalemia, acute volume overload nor any evidence of uremia or anion gap metabolic acidosis and.   Plan: monitor strict I's/O's, daily weights. CMP in the AM. Check mag and phos levels. Would warrant nephrology consultation if patient remains hospitalized into Saturday, 12/29/2022.                    #) Chronic hypoxic respiratory failure: Documented history of such, likely multifactorial in etiology, in the setting of COPD as well as chronic systolic heart failure, on 2 L continuous nasal cannula at baseline, with presenting oxygen saturations noted to be 92 to 97% on this baseline degree of supplemental oxygen.   Plan: Continue baseline 2 L nasal cannula.  Check blood gas.  Further evaluation management of presenting acute COPD exacerbation as well as further evaluation management of chronic systolic heart failure.  Check serum magnesium and phosphorus levels.                 #) Type 2 Diabetes Mellitus: documented history of such.  Managed via lifestyle modifications in the absence of any current oral hypoglycemic agents nor exogenous insulin as an outpatient.  Most recent hemoglobin A1c was found to be 4.8% when checked on 11/29/2022.  Presenting blood sugar 84.  Plan: accuchecks QAC and HS with low dose SSI.                    #) Chronically elevated troponin: Documented history of such, with  chart review revealing chronically elevated troponin dating back to at least March 2023, following which time all high sensitive troponin I values have been in the range of 62-1 22.  Presenting troponin levels this evening are within this baseline range.  No evidence to suggest ACS at this time, including no recent chest pain, while EKG shows no  evidence of acute ischemic changes, including no evidence of ST elevation.  Plan: Monitor on telemetry.  Follow-up for result of updated echocardiogram, which has been ordered as a component of workup/management of presenting acute pulmonary embolism, as above.                        #) Chronic systolic heart failure: documented history of such, with most recent echocardiogram performed as a limited echo in June 2024, which was notable for LVEF 25 to 30%, indeterminate diastolic parameters, severely reduced right ventricular systolic function, severely dilated left atrium, mildly dilated right atrium and moderate to severe mitral regurgitation. No clinical or radiographic evidence to suggest acutely decompensated heart failure at this time, including CTA chest which showed no evidence of infiltrate, edema, or effusion. home diuretic regimen reportedly consists of the following: None in the setting of being anuric at baseline.  Does not appear to be on a beta-blocker at home.    Plan: monitor strict I's & O's and daily weights. Repeat BMP in AM. Check serum mag level.  Would warrant nephrology consultation for routine hemodialysis if the patient remains in the hospital into Saturday, 12/29/2022, as further detailed above.     DVT prophylaxis: SCD's + heparin drip Code Status: Full code Family Communication: none Disposition Plan: Per Rounding Team Consults called: none;  Admission status: Inpatient     I SPENT GREATER THAN 75  MINUTES IN CLINICAL CARE TIME/MEDICAL DECISION-MAKING IN COMPLETING THIS ADMISSION.      Chaney Born Melena Hayes DO [facility]  From 7PM - 7AM   12/27/2022, 11:56 PM

## 2022-12-27 NOTE — ED Triage Notes (Signed)
Pt arrived POV from home c/o [facility] and a cough. Pt states he just had pneumonia a couple weeks ago and is unsure if it got better or it came back. PT is on 2.5 L O2 at baseline.

## 2022-12-27 NOTE — Progress Notes (Signed)
PHARMACY - ANTICOAGULATION CONSULT NOTE  Pharmacy Consult for heparin infusion Indication: pulmonary embolus  Allergies  Allergen Reactions   Codeine Shortness Of Breath   Dextromethorphan-Guaifenesin Shortness Of Breath and Other (See Comments)   Iron Dextran Shortness Of Breath and Other (See Comments)   Morphine And Codeine Shortness Of Breath    Other Reaction(s): Other (See Comments)    Patient Measurements: Height: 5\' 9"  (175.3 cm) Weight: 77.1 kg (170 lb) IBW/kg (Calculated) : 70.7 Heparin Dosing Weight: 77.1 kg  Vital Signs: Temp: 98.5 F (36.9 C) (12/19 1916) Temp Source: Oral (12/19 1916) BP: 134/78 (12/19 2244) Pulse Rate: 83 (12/19 2244)  Labs: Recent Labs    12/27/22 1442 12/27/22 1453 12/27/22 1602 12/27/22 1915  HGB 11.7* 13.9  --   --   HCT 38.9* 41.0  --   --   PLT 120*  --   --   --   CREATININE 4.27* 4.40*  --   --   TROPONINIHS  --   --  69* 91*    Estimated Creatinine Clearance: 17.9 mL/min (A) (by C-G formula based on SCr of 4.4 mg/dL (H)).   Medical History: Past Medical History:  Diagnosis Date   Anemia    Anxiety    Asthma    Complication from renal dialysis device 11/02/2013   Complication of anesthesia 2017   according to pt and spouse pt was moving around and cough while under and pt had difficulty waking up so the anesthesia had to be reversed. and pt admitted to ICU.    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/2008   Qualifier: Diagnosis of  By: Russella Dar MD Marylu Lund    ESRD    on HD, T-TH-Sat - [place] [facility]   Hemiparesis due to old stroke [facility])    left   HFrEF (heart failure with reduced ejection fraction) (HCC)    NICM // TTE 06/2022: EF 25-30, sever RV dysfunction, mod to severe MR   Hypertension     Hx, not current problems, no meds   LV dysfunction    EF 25-30% by echo 07/2011   Memory loss due to medical condition    due to stroke   MR (mitral regurgitation)    moderate to severe, echo 07/2011   Nausea with vomiting, unspecified 06/15/2021   Paroxysmal atrial fibrillation (HCC)    Peripheral vascular disease (HCC)    Shortness of breath    with exertion   Stroke (HCC)    TIA's-left sided weakness   Tobacco abuse     Medications:  (Not in a hospital admission)   Assessment: 62 yo F presents with SOB and cough. Patient has a history of Afib and stroke and is on apixaban 5mg  BID at home. Pt reports taking all of his apixaban doses as prescribed and not missing any recent apixaban doses except for this morning's dose. Pt reports last dose of apixaban was 12/18 @ 2300. CTA chest is positive for acute nonocclusive pulmonary embolism in a posterior right lower lobe segmental artery on 12/19. Pharmacy consulted to dose heparin infusion.   Hgb 13.9, Plt 120 No s/sx of bleeding  Goal of Therapy:  Heparin level 0.3-0.7 units/ml Monitor platelets by anticoagulation protocol: Yes   Plan:  Check baseline  aPTT and heparin level Give heparin 5300 units IV bolus from infusion, then  Initiate heparin infusion at 1300 units/hr  Check aPTT and heparin level in 8 hours Monitor daily CBC, aPTT and heparin levels until correlating given recent apixaban administration, and for s/sx of bleeding   Wilburn Cornelia, PharmD, BCPS Clinical Pharmacist 12/27/2022 11:23 PM   Please refer to AMION for pharmacy phone number

## 2022-12-27 NOTE — ED Provider Triage Note (Signed)
Emergency Medicine Provider Triage Evaluation Note  Nathaniel White , a 62 y.o. male  was evaluated in triage.  Pt complains of cough and wheezing and shortness of breath.  Patient reports he was diagnosed with pneumonia a few weeks ago and treated but only got better for about 2 days and has been progressively worsening.  Seen at urgent care earlier and sent in for further evaluation..  Review of Systems  Positive: Cough and wheezing Negative: Fever  Physical Exam  BP (!) 133/90 (BP Location: Right Arm)   Pulse 88   Temp 98.9 F (37.2 C)   Resp 20   Ht 5\' 9"  (1.753 m)   Wt 77.1 kg   SpO2 92%   BMI 25.10 kg/m  Gen:   Awake, no distress   Resp:  Normal effort  MSK:   Moves extremities without difficulty  Other:  Low on home O2 at 2.5 L.  Medical Decision Making  Medically screening exam initiated at 2:44 PM.  Appropriate orders placed.  Nathaniel White was informed that the remainder of the evaluation will be completed by another provider, this initial triage assessment does not replace that evaluation, and the importance of remaining in the ED until their evaluation is complete.     Arthor Captain, PA-C 12/27/22 1626

## 2022-12-28 ENCOUNTER — Inpatient Hospital Stay (HOSPITAL_COMMUNITY): Payer: Medicare Other

## 2022-12-28 ENCOUNTER — Encounter (HOSPITAL_COMMUNITY): Payer: Self-pay | Admitting: Internal Medicine

## 2022-12-28 ENCOUNTER — Other Ambulatory Visit (HOSPITAL_COMMUNITY): Payer: Medicare Other

## 2022-12-28 DIAGNOSIS — R0602 Shortness of breath: Secondary | ICD-10-CM

## 2022-12-28 DIAGNOSIS — J9611 Chronic respiratory failure with hypoxia: Secondary | ICD-10-CM | POA: Diagnosis not present

## 2022-12-28 DIAGNOSIS — Z86711 Personal history of pulmonary embolism: Secondary | ICD-10-CM

## 2022-12-28 DIAGNOSIS — I2699 Other pulmonary embolism without acute cor pulmonale: Secondary | ICD-10-CM

## 2022-12-28 DIAGNOSIS — R7989 Other specified abnormal findings of blood chemistry: Secondary | ICD-10-CM | POA: Diagnosis present

## 2022-12-28 DIAGNOSIS — R9431 Abnormal electrocardiogram [ECG] [EKG]: Secondary | ICD-10-CM | POA: Diagnosis present

## 2022-12-28 DIAGNOSIS — J206 Acute bronchitis due to rhinovirus: Secondary | ICD-10-CM

## 2022-12-28 DIAGNOSIS — J441 Chronic obstructive pulmonary disease with (acute) exacerbation: Secondary | ICD-10-CM | POA: Diagnosis not present

## 2022-12-28 DIAGNOSIS — E876 Hypokalemia: Secondary | ICD-10-CM | POA: Diagnosis present

## 2022-12-28 DIAGNOSIS — I5022 Chronic systolic (congestive) heart failure: Secondary | ICD-10-CM | POA: Diagnosis present

## 2022-12-28 LAB — I-STAT VENOUS BLOOD GAS, ED
Acid-Base Excess: 2 mmol/L (ref 0.0–2.0)
Bicarbonate: 27.1 mmol/L (ref 20.0–28.0)
Calcium, Ion: 0.96 mmol/L — ABNORMAL LOW (ref 1.15–1.40)
HCT: 33 % — ABNORMAL LOW (ref 39.0–52.0)
Hemoglobin: 11.2 g/dL — ABNORMAL LOW (ref 13.0–17.0)
O2 Saturation: 65 %
Potassium: 3.9 mmol/L (ref 3.5–5.1)
Sodium: 130 mmol/L — ABNORMAL LOW (ref 135–145)
TCO2: 28 mmol/L (ref 22–32)
pCO2, Ven: 43.6 mm[Hg] — ABNORMAL LOW (ref 44–60)
pH, Ven: 7.402 (ref 7.25–7.43)
pO2, Ven: 34 mm[Hg] (ref 32–45)

## 2022-12-28 LAB — ECHOCARDIOGRAM COMPLETE
Area-P 1/2: 3.79 cm2
Calc EF: 50.7 %
Height: 69 in
MV M vel: 5.52 m/s
MV Peak grad: 121.9 mm[Hg]
Radius: 0.8 cm
S' Lateral: 3.4 cm
Single Plane A2C EF: 54.7 %
Single Plane A4C EF: 44.8 %
Weight: 2720 [oz_av]

## 2022-12-28 LAB — COMPREHENSIVE METABOLIC PANEL
ALT: 16 U/L (ref 0–44)
AST: 29 U/L (ref 15–41)
Albumin: 2.2 g/dL — ABNORMAL LOW (ref 3.5–5.0)
Alkaline Phosphatase: 77 U/L (ref 38–126)
Anion gap: 18 — ABNORMAL HIGH (ref 5–15)
BUN: 26 mg/dL — ABNORMAL HIGH (ref 6–20)
CO2: 22 mmol/L (ref 22–32)
Calcium: 8.1 mg/dL — ABNORMAL LOW (ref 8.9–10.3)
Chloride: 88 mmol/L — ABNORMAL LOW (ref 98–111)
Creatinine, Ser: 5.37 mg/dL — ABNORMAL HIGH (ref 0.61–1.24)
GFR, Estimated: 11 mL/min — ABNORMAL LOW (ref >60)
Glucose, Bld: 366 mg/dL — ABNORMAL HIGH (ref 70–99)
Potassium: 3.9 mmol/L (ref 3.5–5.1)
Sodium: 128 mmol/L — ABNORMAL LOW (ref 135–145)
Total Bilirubin: 1 mg/dL (ref <1.2)
Total Protein: 6.9 g/dL (ref 6.5–8.1)

## 2022-12-28 LAB — HEPATITIS B SURFACE ANTIGEN: Hepatitis B Surface Ag: NONREACTIVE

## 2022-12-28 LAB — CBC
HCT: 31.4 % — ABNORMAL LOW (ref 39.0–52.0)
Hemoglobin: 9.3 g/dL — ABNORMAL LOW (ref 13.0–17.0)
MCH: 25.6 pg — ABNORMAL LOW (ref 26.0–34.0)
MCHC: 29.6 g/dL — ABNORMAL LOW (ref 30.0–36.0)
MCV: 86.5 fL (ref 80.0–100.0)
Platelets: 95 10*3/uL — ABNORMAL LOW (ref 150–400)
RBC: 3.63 MIL/uL — ABNORMAL LOW (ref 4.22–5.81)
RDW: 16.8 % — ABNORMAL HIGH (ref 11.5–15.5)
WBC: 3.9 10*3/uL — ABNORMAL LOW (ref 4.0–10.5)
nRBC: 0 % (ref 0.0–0.2)

## 2022-12-28 LAB — RESPIRATORY PANEL BY PCR

## 2022-12-28 LAB — CBG MONITORING, ED
Glucose-Capillary: 156 mg/dL — ABNORMAL HIGH (ref 70–99)
Glucose-Capillary: 119 mg/dL — ABNORMAL HIGH (ref 70–99)
Glucose-Capillary: 116 mg/dL — ABNORMAL HIGH (ref 70–99)

## 2022-12-28 LAB — HEPARIN LEVEL (UNFRACTIONATED): Heparin Unfractionated: >1.1 [IU]/mL — ABNORMAL HIGH (ref 0.30–0.70)

## 2022-12-28 LAB — MAGNESIUM
Magnesium: 1.8 mg/dL (ref 1.7–2.4)
Magnesium: 1.8 mg/dL (ref 1.7–2.4)

## 2022-12-28 LAB — APTT: aPTT: 50 s — ABNORMAL HIGH (ref 24–36)

## 2022-12-28 LAB — SARS CORONAVIRUS 2 BY RT PCR: SARS Coronavirus 2 by RT PCR: NEGATIVE

## 2022-12-28 LAB — PHOSPHORUS: Phosphorus: 6.1 mg/dL — ABNORMAL HIGH (ref 2.5–4.6)

## 2022-12-28 LAB — PROCALCITONIN: Procalcitonin: 1.42 ng/mL

## 2022-12-28 MED ORDER — CHLORHEXIDINE GLUCONATE CLOTH 2 % EX PADS
6.0000 | MEDICATED_PAD | Freq: Every day | CUTANEOUS | Status: DC
  Administered 2022-12-28 – 2022-12-29 (×2): 6 via TOPICAL

## 2022-12-28 MED ORDER — LIDOCAINE-PRILOCAINE 2.5-2.5 % EX CREA: 1.0000 | TOPICAL_CREAM | CUTANEOUS | Status: DC | PRN

## 2022-12-28 MED ORDER — MIDODRINE HCL 5 MG PO TABS
10.0000 mg | ORAL_TABLET | ORAL | Status: DC
  Administered 2022-12-30: 10 mg via ORAL
  Filled 2022-12-28: qty 2

## 2022-12-28 MED ORDER — IPRATROPIUM-ALBUTEROL 0.5-2.5 (3) MG/3ML IN SOLN
3.0000 mL | Freq: Four times a day (QID) | RESPIRATORY_TRACT | Status: DC
  Administered 2022-12-28 – 2022-12-31 (×12): 3 mL via RESPIRATORY_TRACT
  Filled 2022-12-28 (×14): qty 3

## 2022-12-28 MED ORDER — AMIODARONE HCL 200 MG PO TABS
200.0000 mg | ORAL_TABLET | Freq: Every day | ORAL | Status: DC
  Administered 2022-12-28 – 2023-01-01 (×5): 200 mg via ORAL
  Filled 2022-12-28 (×5): qty 1

## 2022-12-28 MED ORDER — METHYLPREDNISOLONE SODIUM SUCC 125 MG IJ SOLR
80.0000 mg | Freq: Two times a day (BID) | INTRAMUSCULAR | Status: DC
  Administered 2022-12-28 – 2022-12-29 (×3): 80 mg via INTRAVENOUS
  Filled 2022-12-28 (×3): qty 2

## 2022-12-28 MED ORDER — APIXABAN 5 MG PO TABS: 5.0000 mg | ORAL_TABLET | Freq: Two times a day (BID) | ORAL | Status: DC

## 2022-12-28 MED ORDER — LIDOCAINE HCL (PF) 1 % IJ SOLN: 5.0000 mL | INTRAMUSCULAR | Status: DC | PRN

## 2022-12-28 MED ORDER — SODIUM CHLORIDE 0.9 % IV SOLN: 500.0000 mg | INTRAVENOUS | Status: DC

## 2022-12-28 MED ORDER — APIXABAN 5 MG PO TABS: 10.0000 mg | ORAL_TABLET | Freq: Two times a day (BID) | ORAL | Status: DC

## 2022-12-28 MED ORDER — ALTEPLASE 2 MG IJ SOLR: 2.0000 mg | Freq: Once | INTRAMUSCULAR | Status: DC | PRN

## 2022-12-28 MED ORDER — SUCROFERRIC OXYHYDROXIDE 500 MG PO CHEW
1000.0000 mg | CHEWABLE_TABLET | Freq: Two times a day (BID) | ORAL | Status: DC
  Administered 2022-12-28 – 2023-01-01 (×8): 1000 mg via ORAL
  Filled 2022-12-28 (×10): qty 2

## 2022-12-28 MED ORDER — PERFLUTREN LIPID MICROSPHERE
1.0000 mL | INTRAVENOUS | Status: AC | PRN
Start: 2022-12-28 — End: 2022-12-28
  Administered 2022-12-28: 3 mL via INTRAVENOUS

## 2022-12-28 MED ORDER — ALBUTEROL SULFATE (2.5 MG/3ML) 0.083% IN NEBU
2.5000 mg | INHALATION_SOLUTION | RESPIRATORY_TRACT | Status: DC | PRN
  Administered 2022-12-30: 2.5 mg via RESPIRATORY_TRACT
  Filled 2022-12-28: qty 3

## 2022-12-28 MED ORDER — HEPARIN SODIUM (PORCINE) 1000 UNIT/ML IJ SOLN
4200.0000 [IU] | Freq: Once | INTRAMUSCULAR | Status: AC
  Administered 2022-12-28: 4200 [IU]
  Filled 2022-12-28: qty 5

## 2022-12-28 MED ORDER — DOXYCYCLINE HYCLATE 100 MG IV SOLR
100.0000 mg | Freq: Two times a day (BID) | INTRAVENOUS | Status: DC
  Administered 2022-12-28 – 2022-12-31 (×6): 100 mg via INTRAVENOUS
  Filled 2022-12-28 (×8): qty 100

## 2022-12-28 MED ORDER — INSULIN ASPART 100 UNIT/ML IJ SOLN
0.0000 [IU] | Freq: Three times a day (TID) | INTRAMUSCULAR | Status: DC
Start: 2022-12-28 — End: 2023-01-01

## 2022-12-28 MED ORDER — MIDODRINE HCL 5 MG PO TABS
10.0000 mg | ORAL_TABLET | Freq: Once | ORAL | Status: AC
  Administered 2022-12-28: 10 mg via ORAL
  Filled 2022-12-28: qty 2

## 2022-12-28 MED ORDER — HEPARIN SODIUM (PORCINE) 1000 UNIT/ML DIALYSIS: 1000.0000 [IU] | INTRAMUSCULAR | Status: DC | PRN

## 2022-12-28 MED ORDER — PENTAFLUOROPROP-TETRAFLUOROETH EX AERO: 1.0000 | INHALATION_SPRAY | CUTANEOUS | Status: DC | PRN

## 2022-12-28 MED ORDER — APIXABAN 5 MG PO TABS
5.0000 mg | ORAL_TABLET | Freq: Two times a day (BID) | ORAL | Status: DC
  Administered 2022-12-28 – 2023-01-01 (×9): 5 mg via ORAL
  Filled 2022-12-28 (×4): qty 1
  Filled 2022-12-28: qty 2
  Filled 2022-12-28 (×4): qty 1

## 2022-12-28 MED ORDER — ANTICOAGULANT SODIUM CITRATE 4% (200MG/5ML) IV SOLN
5.0000 mL | Status: DC | PRN
  Filled 2022-12-28: qty 5

## 2022-12-28 MED ORDER — DOXERCALCIFEROL 4 MCG/2ML IV SOLN
4.0000 ug | INTRAVENOUS | Status: DC
Start: 2022-12-29 — End: 2023-01-01
  Administered 2022-12-30: 4 ug via INTRAVENOUS
  Filled 2022-12-28 (×3): qty 2

## 2022-12-28 NOTE — Assessment & Plan Note (Signed)
-   No signs or symptoms of exacerbation - Last echo from 06/19/2022: EF 25 to 30%, no RWMA, indeterminate diastolic function; moderate to severe MR

## 2022-12-28 NOTE — Assessment & Plan Note (Signed)
-   Reviewed with radiology.  PE noted in the right lower lobe is similar in appearance from PE noted August 2023 with no changes.  Suspect this is chronic PE that has not recannulized the vessel; has much more explanation for his dyspnea as noted above.  Do not believe this is Eliquis failure nor acute PE after reviewing with radiology -Discontinue heparin drip and resume home Eliquis

## 2022-12-28 NOTE — Assessment & Plan Note (Signed)
-   Continue SSI and CBG monitoring ?

## 2022-12-28 NOTE — Assessment & Plan Note (Signed)
 Continue amiodarone and Eliquis.

## 2022-12-28 NOTE — Assessment & Plan Note (Signed)
-   Suspected exacerbation from rhinovirus - Continue breathing treatments and steroids

## 2022-12-28 NOTE — Assessment & Plan Note (Signed)
-   RVP positive for rhinovirus likely explaining his clinical presentation with worsening dyspnea, cough, and COPD exacerbation - Does not have any wheezing on exam - Continue Solu-Medrol - Continue DuoNebs

## 2022-12-28 NOTE — Assessment & Plan Note (Signed)
-   Chronically elevated and at baseline in setting of underlying ESRD -No chest pain or EKG changes noted

## 2022-12-28 NOTE — Consult Note (Signed)
Renal Service Consult Note [facility]  Nathaniel White 12/28/2022 Nathaniel Krabbe, MD Requesting Physician: Dr. Frederick White  Reason for Consult: ESRD pt w/ SOB and cough HPI: The patient is a 62 y.o. year-old w/ PMH as below who presented to ED yesterday c/o SOB and cough, just had PNA 2 wks ago. Pt is on 2.5 L home O2 at baseline. Last admit was mid Nov 2024 for CAP and COPD exacerbation rx'd w/ IV abx. Over last 3 days SOB has been worsening, w/ new onset cough, occ wheezing. Om ED JR 80. Afeb. SB{ 100-130, RR 17-20. 95% on 2L [place]. Labs showed K 3.2, alb 2.4, BNP 2300, trop 69, WBC 5.3, Hb 11.7. CXR showed bilat infiltrates c/w pulm edema. CT showed ground glass changes c/w pulm edema. CTA chest showed pulm embolus. Pt admitted and started on IV heparin. We are asked to see for dialysis.   Pt seen in room. On HD x 27 yrs, except 1 year has a transplant from [place]. Pt denies any CP or fevers. No n/v/d.    ROS - denies CP, no joint pain, no HA, no blurry vision, no rash, no diarrhea, no nausea/ vomiting, no dysuria, no difficulty voiding   Past Medical History  Past Medical History:  Diagnosis Date   Anemia    Anxiety    Asthma    Complication from renal dialysis device 11/02/2013   Complication of anesthesia 2017   according to pt and spouse pt was moving around and cough while under and pt had difficulty waking up so the anesthesia had to be reversed. and pt admitted to ICU.    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/2008   Qualifier: Diagnosis of  By: Russella Dar MD Nathaniel White    ESRD    on HD, T-TH-Sat - [place]   Hemiparesis due to old stroke [facility])    left   HFrEF (heart failure with reduced ejection fraction) (HCC)    NICM // TTE 06/2022: EF  25-30, sever RV dysfunction, mod to severe MR   Hypertension    Hx, not current problems, no meds   LV dysfunction    EF 25-30% by echo 07/2011   Memory loss due to medical condition    due to stroke   MR (mitral regurgitation)    moderate to severe, echo 07/2011   Nausea with vomiting, unspecified 06/15/2021   Paroxysmal atrial fibrillation (HCC)    Peripheral vascular disease (HCC)    Shortness of breath    with exertion   Stroke (HCC)    TIA's-left sided weakness   Tobacco abuse    Past Surgical History  Past Surgical History:  Procedure Laterality Date   ABDOMINAL AORTOGRAM W/LOWER EXTREMITY Bilateral 07/21/2018   Procedure: ABDOMINAL AORTOGRAM W/LOWER EXTREMITY;  Surgeon: Maeola Harman, MD;  Location[facility] INVASIVE CV LAB;  Service: Cardiovascular;  Laterality: Bilateral;   AMPUTATION Left 11/05/2018   Procedure: AMPUTATION SECOND TOE LEFT FOOT;  Surgeon: Maeola Harman, MD;  Location: [facility] OR;  Service: Vascular;  Laterality: Left;   AMPUTATION Left 02/13/2021   Procedure: Left small finger AMPUTATION DIGIT;  Surgeon: Marlyne Beards, MD;  Location: [facility] OR;  Service: Orthopedics;  Laterality: Left;   AV FISTULA PLACEMENT  CARDIAC CATHETERIZATION     [facility]   COLONOSCOPY W/ BIOPSIES AND POLYPECTOMY     FISTULA SUPERFICIALIZATION Left 11/10/2013   Procedure: FISTULA PLICATION;  Surgeon: Nada Libman, MD;  Location: [facility] OR;  Service: Vascular;  Laterality: Left;   INSERTION OF DIALYSIS CATHETER N/A 06/20/2021   Procedure: INSERTION OF DIALYSIS CATHETER;  Surgeon: Victorino Sparrow, MD;  Location: [facility] OR;  Service: Vascular;  Laterality: N/A;   KIDNEY TRANSPLANT     2011 rejected kidney 2012 back on dialysis   LEFT AND RIGHT HEART CATHETERIZATION WITH CORONARY ANGIOGRAM N/A 10/09/2013   Procedure: LEFT AND RIGHT HEART CATHETERIZATION WITH CORONARY ANGIOGRAM;  Surgeon: Kathleene Hazel, MD;  Location: [facility] CATH LAB;  Service: Cardiovascular;   Laterality: N/A;   LIGATION OF ARTERIOVENOUS  FISTULA Left 06/20/2021   Procedure: LIGATION OF ARTERIOVENOUS  FISTULA;  Surgeon: Victorino Sparrow, MD;  Location: [facility] OR;  Service: Vascular;  Laterality: Left;   PERIPHERAL VASCULAR INTERVENTION Left 07/21/2018   Procedure: PERIPHERAL VASCULAR INTERVENTION;  Surgeon: Maeola Harman, MD;  Location: [facility] INVASIVE CV LAB;  Service: Cardiovascular;  Laterality: Left;  Popliteal   REVISON OF ARTERIOVENOUS FISTULA Left 08/26/2013   Procedure: EXCISION OF ERODED SKIN AND EXPLORATION OF MAIN LEFT UPPER ARM AV FISTULA;  Surgeon: Larina Earthly, MD;  Location: [facility] OR;  Service: Vascular;  Laterality: Left;   REVISON OF ARTERIOVENOUS FISTULA Left 02/05/2014   Procedure: REPAIR OF ARTERIOVENOUS FISTULA ANEURYSM;  Surgeon: Nada Libman, MD;  Location: [facility] OR;  Service: Vascular;  Laterality: Left;   REVISON OF ARTERIOVENOUS FISTULA Left 03/10/2021   Procedure: LEFT ARM REVISION OF ARTERIOVENOUS FISTULA WITH PLICATION;  Surgeon: Maeola Harman, MD;  Location: [facility] OR;  Service: Vascular;  Laterality: Left;   RIGHT/LEFT HEART CATH AND CORONARY ANGIOGRAPHY N/A 06/21/2022   Procedure: RIGHT/LEFT HEART CATH AND CORONARY ANGIOGRAPHY;  Surgeon: Lennette Bihari, MD;  Location: [facility] INVASIVE CV LAB;  Service: Cardiovascular;  Laterality: N/A;   SCROTAL EXPLORATION N/A 09/16/2022   Procedure: PENILE DEBRIDMENT;  Surgeon: Rene Paci, MD;  Location: [facility] OR;  Service: Urology;  Laterality: N/A;   SHUNTOGRAM N/A 11/05/2012   Procedure: Fistulogram;  Surgeon: Nada Libman, MD;  Location: [facility] CATH LAB;  Service: Cardiovascular;  Laterality: N/A;   TEE WITHOUT CARDIOVERSION  08/17/2011   Procedure: TRANSESOPHAGEAL ECHOCARDIOGRAM (TEE);  Surgeon: Laurey Morale, MD;  Location: [facility] ENDOSCOPY;  Service: Cardiovascular;  Laterality: N/A;   ULTRASOUND GUIDANCE FOR VASCULAR ACCESS  06/20/2021   Procedure: ULTRASOUND GUIDANCE FOR VASCULAR ACCESS;  Surgeon: Victorino Sparrow, MD;  Location: [facility] OR;  Service: Vascular;;   VIDEO ASSISTED THORACOSCOPY (VATS)/DECORTICATION  08/10/11   Family History  Family History  Problem Relation Age of Onset   Hypertension Mother    Varicose Veins Mother    Diabetes Paternal Grandmother    Cancer Paternal Grandfather    CAD Paternal Uncle    Social History  reports that he quit smoking about 10 years ago. His smoking use included cigarettes. He started smoking about 25 years ago. He has a 7.5 pack-year smoking history. He has never used smokeless tobacco. He reports that he does not drink alcohol and does not use drugs. Allergies  Allergies  Allergen Reactions   Codeine Shortness Of Breath   Dextromethorphan-Guaifenesin Shortness Of Breath and Other (See Comments)   Iron Dextran Shortness Of Breath and Other (See Comments)   Morphine And Codeine Shortness Of Breath  Other Reaction(s): Other (See Comments)   Home medications Prior to Admission medications   Medication Sig Start Date End Date Taking? Authorizing Provider  acetaminophen (TYLENOL) 500 MG tablet Take 1 tablet (500 mg total) by mouth every 6 (six) hours as needed. Patient taking differently: Take 500 mg by mouth every 6 (six) hours as needed for mild pain (pain score 1-3). 04/11/18  Yes Burky, Barron Alvine, NP  albuterol (PROAIR HFA) 108 (90 Base) MCG/ACT inhaler Inhale 2 puffs into the lungs every 6 (six) hours as needed for wheezing or shortness of breath. 11/30/22  Yes Rai, Ripudeep K, MD  amiodarone (PACERONE) 200 MG tablet Take 1 tablet (200 mg total) by mouth daily. 09/25/22  Yes Pray, Milus Mallick, MD  ELIQUIS 5 MG TABS tablet Take 5 mg by mouth 2 (two) times daily. 12/09/21  Yes [provider]  midodrine (PROAMATINE) 10 MG tablet Take 1 tablet (10 mg total) by mouth 3 (three) times daily with meals. Patient taking differently: Take 10 mg by mouth See admin instructions. TAKE 1 TABLET BY MOUTH THREE TIMES A WEEK. TAKE 30 MINUTES BEFORE  HEMODIALYSIS. TAKE 1 EXTRA TABLET MID TREATMENT 09/24/22  Yes Alfredo Martinez, MD  polyethylene glycol (MIRALAX) 17 g packet Take 17 g by mouth daily as needed for moderate constipation or mild constipation. Also available over-the-counter 11/30/22  Yes Rai, Ripudeep K, MD  senna-docusate (SENOKOT-S) 8.6-50 MG tablet Take 2 tablets by mouth at bedtime. For constipation Patient taking differently: Take 2 tablets by mouth at bedtime as needed for moderate constipation. For constipation 11/30/22  Yes Rai, Ripudeep K, MD  traZODone (DESYREL) 50 MG tablet Take 50 mg by mouth at bedtime as needed for sleep.   Yes [provider]  VELPHORO 500 MG chewable tablet Chew 1,000 mg by mouth 2 (two) times daily after a meal. 02/06/21  Yes [provider]  VISINE 0.05 % ophthalmic solution Place 2-3 drops into both eyes 2 (two) times daily as needed (dry eye). 07/04/21  Yes [provider]  albuterol (PROVENTIL) (2.5 MG/3ML) 0.083% nebulizer solution Take 3 mLs (2.5 mg total) by nebulization every 6 (six) hours as needed for wheezing or shortness of breath. Patient not taking: Reported on 11/29/2022 08/25/21   Hoy Register, MD  mometasone-formoterol (DULERA) 200-5 MCG/ACT AERO Inhale 2 puffs into the lungs 2 (two) times daily. Patient not taking: Reported on 12/27/2022 11/30/22   Rai, Delene Ruffini, MD  Tenapanor HCl, CKD, (XPHOZAH) 30 MG TABS Take 30 mg by mouth daily. Patient not taking: Reported on 12/27/2022    [provider]     Vitals:   12/28/22 0815 12/28/22 0830 12/28/22 0945 12/28/22 0946  BP:  136/70 (!) 163/88   Pulse: 75  68   Resp: (!) 25 20 19    Temp:   98.3 F (36.8 C) 98.2 F (36.8 C)  TempSrc:   Oral Oral  SpO2: 100%  100%   Weight:      Height:       Exam Gen alert, no distress, a bit SOB at rest, [place] O2 No rash, cyanosis or gangrene Sclera anicteric, throat clear  No jvd or bruits Chest clear bilat to bases, no rales/ wheezing RRR no RG Abd soft  ntnd no mass or ascites +bs GU defer MS no joint effusions or deformity Ext 1+bilat pretib edema w/ chronic skin changes, no wounds or ulcers Neuro is alert, Ox 3 , nf      LIJ TDC intact, old access  in L arm         Renal-related home meds: - eliquis 5 bid - midodrine 10 mg pre hd tts - xphozah 30 every day - velphoro 1 gm ac bid   OP HD: SW TTS  4h  400/500  68.2kg   2/2 bath  LIJ TDC    Heparin none - last OP HD 12/19, post wt 70.0kg, coming off 0-3 kg over the last 3 wks - mircera 50 mcg q2, last 12/10, due 12/24 - hectorol 4 mcg   Assessment/ Plan: Acute on chronic hypoxic resp failure - on home O2 2-2.5 L [place]. Here w/ SOB. Imaging showing pulm edema by CXR and CT scan. Underlying COPD, chronic lung disease. Needs volume removal and possibly lowering of dry wt.  Acute pulm embolism - on IV Heparin ESKD - on HD TTS. Had HD yesterday. Needs additional HD today to get vol down  HTN - BP's wnl, takes midodrine pre HD.  Volume - some LE edema, otherwise looks okay Anemia of eskd - Hb 9- 11 here,  MBD ckd - CCa and phos are in range. Cont binders w/ meals.       Vinson Moselle  MD [facility] 12/28/2022, 11:01 AM  Recent Labs  Lab 12/27/22 1442 12/27/22 1453 12/27/22 1602 12/28/22 0432 12/28/22 0436  HGB 11.7* 13.9  --  9.3* 11.2*  ALBUMIN  --   --  2.4* 2.2*  --   CALCIUM 8.7*  --   --  8.1*  --   PHOS  --   --   --  6.1*  --   CREATININE 4.27* 4.40*  --  5.37*  --   K 3.1* 3.2*  --  3.9 3.9   Inpatient medications:  amiodarone  200 mg Oral Daily   apixaban  5 mg Oral BID   insulin aspart  0-6 Units Subcutaneous TID WC   ipratropium-albuterol  3 mL Nebulization Q6H   methylPREDNISolone (SOLU-MEDROL) injection  80 mg Intravenous Q12H   [START ON 12/29/2022] midodrine  10 mg Oral Q T,Th,Sa-HD   sucroferric oxyhydroxide  1,000 mg Oral BID PC    doxycycline (VIBRAMYCIN) IV Stopped (12/28/22 0553)   acetaminophen **OR** acetaminophen, albuterol, melatonin,  ondansetron (ZOFRAN) IV

## 2022-12-28 NOTE — Procedures (Signed)
I have reviewed the HD regimen and made appropriate changes.  Vinson Moselle MD  [facility] 12/28/2022, 3:33 PM

## 2022-12-28 NOTE — Assessment & Plan Note (Signed)
-   On 2 L chronic oxygen at home - Increased to 4 L on admission in setting of COPD exacerbation - Wean back to home setting as able

## 2022-12-28 NOTE — ED Notes (Signed)
Pt eating breakfast 

## 2022-12-28 NOTE — Progress Notes (Addendum)
Received patient in bed.Awake,alert and oriented x 4. SOB at rest,Non productive coughing every now and then that made HD machine  beeped a lot.and maintaining good air saturation at 3 lpm/[place] which is his baseline.  Access used: Left HD  catheter that worked well.Dressing change done today.  Duration of treatment: 3.25 hours.  Net UF :  Achieved 3.5 L  Tolerated treatment: Yes.  Medicine given: Midodrine 10 mg.  Hemo comment: Non productive coughing were less frequent post treatment.  Hand off to the patient's ED nurse: Sent to his room with stable medical condition via transporter.

## 2022-12-28 NOTE — Progress Notes (Signed)
Progress Note    Nathaniel White   NWG:[id number]  DOB: Oct 11, 1960  DOA: 12/27/2022     1 PCP: Claiborne Rigg, NP  Initial CC: dyspnea  Hospital Course: Nathaniel White is a 62 year old male with PMH PE (08/2021), PAF, ESRD on HD, COPD, chronic hypoxic respiratory failure on 2 L, marked chronic lung disease, cirrhosis who presented with cough and worsening shortness of breath. He was found to be hypoxic and oxygen was increased to 4 L on admission. He was admitted to undergo further workup regarding his dyspnea.  Interval History:  Resting in bed when seen in the ER this morning.  Was actually stating that he was feeling a little bit better in regards to the dyspnea he had on admission.  Assessment and Plan: * History of pulmonary embolism - Reviewed with radiology.  PE noted in the right lower lobe is similar in appearance from PE noted August 2023 with no changes.  Suspect this is chronic PE that has not recannulized the vessel; has much more explanation for his dyspnea as noted above.  Do not believe this is Eliquis failure nor acute PE after reviewing with radiology -Discontinue heparin drip and resume home Eliquis  Acute bronchitis due to Rhinovirus - RVP positive for rhinovirus likely explaining his clinical presentation with worsening dyspnea, cough, and COPD exacerbation - Does not have any wheezing on exam - Continue Solu-Medrol - Continue DuoNebs  COPD with acute exacerbation (HCC) - Suspected exacerbation from rhinovirus - Continue breathing treatments and steroids  Chronic hypoxic respiratory failure (HCC) - On 2 L chronic oxygen at home - Increased to 4 L on admission in setting of COPD exacerbation - Wean back to home setting as able  End-stage renal disease on hemodialysis (HCC) - on TThSa, last session Thursday - will notify nephrology as patient will be here at least til Saturday  Paroxysmal atrial fibrillation (HCC) - Continue amiodarone and  Eliquis  Elevated troponin - Chronically elevated and at baseline in setting of underlying ESRD -No chest pain or EKG changes noted  Chronic systolic CHF (congestive heart failure) (HCC) - No signs or symptoms of exacerbation - Last echo from 06/19/2022: EF 25 to 30%, no RWMA, indeterminate diastolic function; moderate to severe MR  Hypokalemia - Replete as needed  DM2 (diabetes mellitus, type 2) (HCC) - Continue SSI and CBG monitoring   Old records reviewed in assessment of this patient  Antimicrobials: Doxy 12/28/2022 >> current  DVT prophylaxis:   apixaban (ELIQUIS) tablet 5 mg   Code Status:   Code Status: Full Code  Mobility Assessment (Last 72 Hours)     Mobility Assessment     Row Name 12/28/22 0500           Does patient have an order for bedrest or is patient medically unstable No - Continue assessment                Barriers to discharge: none Disposition Plan:  Home Status is: Inpt  Objective: Blood pressure (!) 176/101, pulse (!) 58, temperature 98.2 F (36.8 C), temperature source Oral, resp. rate (!) 24, height 5\' 9"  (1.753 m), weight 77.1 kg, SpO2 98%.  Examination:  Physical Exam Constitutional:      General: He is not in acute distress.    Appearance: Normal appearance.  HENT:     Head: Normocephalic and atraumatic.     Mouth/Throat:     Mouth: Mucous membranes are moist.  Eyes:     Extraocular Movements:  Extraocular movements intact.  Cardiovascular:     Rate and Rhythm: Normal rate and regular rhythm.  Pulmonary:     Effort: Pulmonary effort is normal. No respiratory distress.     Breath sounds: Rhonchi (diffuse bilaterally) present. No wheezing.  Chest:     Comments: Permacath noted over left chest Abdominal:     General: Bowel sounds are normal. There is no distension.     Palpations: Abdomen is soft.     Tenderness: There is no abdominal tenderness.  Musculoskeletal:        General: Normal range of motion.     Cervical  back: Normal range of motion and neck supple.  Skin:    General: Skin is warm and dry.  Neurological:     General: No focal deficit present.     Mental Status: He is alert.  Psychiatric:        Mood and Affect: Mood normal.        Behavior: Behavior normal.      Consultants:  Nephrology  Procedures:    Data Reviewed: Results for orders placed or performed during the hospital encounter of 12/27/22 (from the past 24 hours)  Basic metabolic panel     Status: Abnormal   Collection Time: 12/27/22  2:42 PM  Result Value Ref Range   Sodium 135 135 - 145 mmol/L   Potassium 3.1 (L) 3.5 - 5.1 mmol/L   Chloride 95 (L) 98 - 111 mmol/L   CO2 27 22 - 32 mmol/L   Glucose, Bld 84 70 - 99 mg/dL   BUN 20 6 - 20 mg/dL   Creatinine, Ser 1.61 (H) 0.61 - 1.24 mg/dL   Calcium 8.7 (L) 8.9 - 10.3 mg/dL   GFR, Estimated 15 (L) >60 mL/min   Anion gap 13 5 - 15  CBC with Differential     Status: Abnormal   Collection Time: 12/27/22  2:42 PM  Result Value Ref Range   WBC 5.3 4.0 - 10.5 K/uL   RBC 4.54 4.22 - 5.81 MIL/uL   Hemoglobin 11.7 (L) 13.0 - 17.0 g/dL   HCT 09.6 (L) 04.5 - 40.9 %   MCV 85.7 80.0 - 100.0 fL   MCH 25.8 (L) 26.0 - 34.0 pg   MCHC 30.1 30.0 - 36.0 g/dL   RDW 81.1 (H) 91.4 - 78.2 %   Platelets 120 (L) 150 - 400 K/uL   nRBC 0.0 0.0 - 0.2 %   Neutrophils Relative % 66 %   Neutro Abs 3.6 1.7 - 7.7 K/uL   Lymphocytes Relative 14 %   Lymphs Abs 0.7 0.7 - 4.0 K/uL   Monocytes Relative 14 %   Monocytes Absolute 0.7 0.1 - 1.0 K/uL   Eosinophils Relative 5 %   Eosinophils Absolute 0.3 0.0 - 0.5 K/uL   Basophils Relative 1 %   Basophils Absolute 0.0 0.0 - 0.1 K/uL   Immature Granulocytes 0 %   Abs Immature Granulocytes 0.01 0.00 - 0.07 K/uL  Brain natriuretic peptide     Status: Abnormal   Collection Time: 12/27/22  2:43 PM  Result Value Ref Range   B Natriuretic Peptide 2,332.5 (H) 0.0 - 100.0 pg/mL  I-stat chem 8, ED (not at [facility], [facility] or [facility])     Status: Abnormal    Collection Time: 12/27/22  2:53 PM  Result Value Ref Range   Sodium 138 135 - 145 mmol/L   Potassium 3.2 (L) 3.5 - 5.1 mmol/L   Chloride 95 (L) 98 -  111 mmol/L   BUN 23 (H) 6 - 20 mg/dL   Creatinine, Ser 1.61 (H) 0.61 - 1.24 mg/dL   Glucose, Bld 85 70 - 99 mg/dL   Calcium, Ion 0.96 (L) 1.15 - 1.40 mmol/L   TCO2 29 22 - 32 mmol/L   Hemoglobin 13.9 13.0 - 17.0 g/dL   HCT 04.5 40.9 - 81.1 %  Troponin I (High Sensitivity)     Status: Abnormal   Collection Time: 12/27/22  4:02 PM  Result Value Ref Range   Troponin I (High Sensitivity) 69 (H) <18 ng/L  Hepatic function panel     Status: Abnormal   Collection Time: 12/27/22  4:02 PM  Result Value Ref Range   Total Protein 7.6 6.5 - 8.1 g/dL   Albumin 2.4 (L) 3.5 - 5.0 g/dL   AST 30 15 - 41 U/L   ALT 18 0 - 44 U/L   Alkaline Phosphatase 85 38 - 126 U/L   Total Bilirubin 0.9 <1.2 mg/dL   Bilirubin, Direct 0.3 (H) 0.0 - 0.2 mg/dL   Indirect Bilirubin 0.6 0.3 - 0.9 mg/dL  Troponin I (High Sensitivity)     Status: Abnormal   Collection Time: 12/27/22  7:15 PM  Result Value Ref Range   Troponin I (High Sensitivity) 91 (H) <18 ng/L  APTT     Status: Abnormal   Collection Time: 12/27/22 11:44 PM  Result Value Ref Range   aPTT 50 (H) 24 - 36 seconds  Heparin level (unfractionated)     Status: Abnormal   Collection Time: 12/27/22 11:44 PM  Result Value Ref Range   Heparin Unfractionated >1.10 (H) 0.30 - 0.70 IU/mL  Magnesium     Status: None   Collection Time: 12/27/22 11:44 PM  Result Value Ref Range   Magnesium 1.8 1.7 - 2.4 mg/dL  Comprehensive metabolic panel     Status: Abnormal   Collection Time: 12/28/22  4:32 AM  Result Value Ref Range   Sodium 128 (L) 135 - 145 mmol/L   Potassium 3.9 3.5 - 5.1 mmol/L   Chloride 88 (L) 98 - 111 mmol/L   CO2 22 22 - 32 mmol/L   Glucose, Bld 366 (H) 70 - 99 mg/dL   BUN 26 (H) 6 - 20 mg/dL   Creatinine, Ser 9.14 (H) 0.61 - 1.24 mg/dL   Calcium 8.1 (L) 8.9 - 10.3 mg/dL   Total Protein 6.9 6.5  - 8.1 g/dL   Albumin 2.2 (L) 3.5 - 5.0 g/dL   AST 29 15 - 41 U/L   ALT 16 0 - 44 U/L   Alkaline Phosphatase 77 38 - 126 U/L   Total Bilirubin 1.0 <1.2 mg/dL   GFR, Estimated 11 (L) >60 mL/min   Anion gap 18 (H) 5 - 15  Magnesium     Status: None   Collection Time: 12/28/22  4:32 AM  Result Value Ref Range   Magnesium 1.8 1.7 - 2.4 mg/dL  Phosphorus     Status: Abnormal   Collection Time: 12/28/22  4:32 AM  Result Value Ref Range   Phosphorus 6.1 (H) 2.5 - 4.6 mg/dL  CBC     Status: Abnormal   Collection Time: 12/28/22  4:32 AM  Result Value Ref Range   WBC 3.9 (L) 4.0 - 10.5 K/uL   RBC 3.63 (L) 4.22 - 5.81 MIL/uL   Hemoglobin 9.3 (L) 13.0 - 17.0 g/dL   HCT 78.2 (L) 95.6 - 21.3 %   MCV 86.5 80.0 - 100.0 fL  MCH 25.6 (L) 26.0 - 34.0 pg   MCHC 29.6 (L) 30.0 - 36.0 g/dL   RDW 19.1 (H) 47.8 - 29.5 %   Platelets 95 (L) 150 - 400 K/uL   nRBC 0.0 0.0 - 0.2 %  I-Stat venous blood gas, ([facility] ED, [facility], [facility])     Status: Abnormal   Collection Time: 12/28/22  4:36 AM  Result Value Ref Range   pH, Ven 7.402 7.25 - 7.43   pCO2, Ven 43.6 (L) 44 - 60 mmHg   pO2, Ven 34 32 - 45 mmHg   Bicarbonate 27.1 20.0 - 28.0 mmol/L   TCO2 28 22 - 32 mmol/L   O2 Saturation 65 %   Acid-Base Excess 2.0 0.0 - 2.0 mmol/L   Sodium 130 (L) 135 - 145 mmol/L   Potassium 3.9 3.5 - 5.1 mmol/L   Calcium, Ion 0.96 (L) 1.15 - 1.40 mmol/L   HCT 33.0 (L) 39.0 - 52.0 %   Hemoglobin 11.2 (L) 13.0 - 17.0 g/dL   Sample type VENOUS    Comment NOTIFIED PHYSICIAN   SARS Coronavirus 2 by RT PCR (hospital order, performed in [facility] hospital lab) *cepheid single result test* Anterior Nasal Swab     Status: None   Collection Time: 12/28/22  8:12 AM   Specimen: Anterior Nasal Swab  Result Value Ref Range   SARS Coronavirus 2 by RT PCR NEGATIVE NEGATIVE  Respiratory (~20 pathogens) panel by PCR     Status: Abnormal   Collection Time: 12/28/22  8:13 AM   Specimen: Anterior Nasal Swab; Respiratory  Result Value Ref Range    Adenovirus NOT DETECTED NOT DETECTED   Coronavirus 229E NOT DETECTED NOT DETECTED   Coronavirus HKU1 NOT DETECTED NOT DETECTED   Coronavirus NL63 NOT DETECTED NOT DETECTED   Coronavirus OC43 NOT DETECTED NOT DETECTED   Metapneumovirus NOT DETECTED NOT DETECTED   Rhinovirus / Enterovirus DETECTED (A) NOT DETECTED   Influenza A NOT DETECTED NOT DETECTED   Influenza B NOT DETECTED NOT DETECTED   Parainfluenza Virus 1 NOT DETECTED NOT DETECTED   Parainfluenza Virus 2 NOT DETECTED NOT DETECTED   Parainfluenza Virus 3 NOT DETECTED NOT DETECTED   Parainfluenza Virus 4 NOT DETECTED NOT DETECTED   Respiratory Syncytial Virus NOT DETECTED NOT DETECTED   Bordetella pertussis NOT DETECTED NOT DETECTED   Bordetella Parapertussis NOT DETECTED NOT DETECTED   Chlamydophila pneumoniae NOT DETECTED NOT DETECTED   Mycoplasma pneumoniae NOT DETECTED NOT DETECTED  CBG monitoring, ED     Status: Abnormal   Collection Time: 12/28/22  8:20 AM  Result Value Ref Range   Glucose-Capillary 156 (H) 70 - 99 mg/dL  Procalcitonin     Status: None   Collection Time: 12/28/22  9:08 AM  Result Value Ref Range   Procalcitonin 1.42 ng/mL  CBG monitoring, ED     Status: Abnormal   Collection Time: 12/28/22 11:50 AM  Result Value Ref Range   Glucose-Capillary 119 (H) 70 - 99 mg/dL    I have reviewed pertinent nursing notes, vitals, labs, and images as necessary. I have ordered labwork to follow up on as indicated.  I have reviewed the last notes from staff over past 24 hours. I have discussed patient's care plan and test results with nursing staff, CM/SW, and other staff as appropriate.  Time spent: Greater than 50% of the 55 minute visit was spent in counseling/coordination of care for the patient as laid out in the A&P.   LOS: 1 day  Lewie Chamber, MD [facility] 12/28/2022, 12:39 PM

## 2022-12-28 NOTE — Hospital Course (Signed)
Nathaniel White is a 62 year old male with PMH PE (08/2021), PAF, ESRD on HD, COPD, chronic hypoxic respiratory failure on 2 L, marked chronic lung disease, cirrhosis who presented with cough and worsening shortness of breath. He was found to be hypoxic and oxygen was increased to 4 L on admission. He was admitted to undergo further workup regarding his dyspnea.

## 2022-12-28 NOTE — Progress Notes (Signed)
Lower extremity venous duplex completed. Please see CV Procedures for preliminary results.  Shona Simpson, RVT 12/28/22 10:33 AM

## 2022-12-28 NOTE — Assessment & Plan Note (Signed)
 Replete as needed

## 2022-12-28 NOTE — Progress Notes (Addendum)
PHARMACY - ANTICOAGULATION CONSULT NOTE  Pharmacy Consult for heparin infusion Indication: pulmonary embolus  Allergies  Allergen Reactions   Codeine Shortness Of Breath   Dextromethorphan-Guaifenesin Shortness Of Breath and Other (See Comments)   Iron Dextran Shortness Of Breath and Other (See Comments)   Morphine And Codeine Shortness Of Breath    Other Reaction(s): Other (See Comments)    Patient Measurements: Height: 5\' 9"  (175.3 cm) Weight: 77.1 kg (170 lb) IBW/kg (Calculated) : 70.7 Heparin Dosing Weight: 77.1 kg  Vital Signs: Temp: 99.2 F (37.3 C) (12/20 0334) Temp Source: Oral (12/20 0334) BP: 136/70 (12/20 0830) Pulse Rate: 75 (12/20 0815)  Labs: Recent Labs    12/27/22 1442 12/27/22 1453 12/27/22 1602 12/27/22 1915 12/27/22 2344 12/28/22 0432 12/28/22 0436  HGB 11.7* 13.9  --   --   --  9.3* 11.2*  HCT 38.9* 41.0  --   --   --  31.4* 33.0*  PLT 120*  --   --   --   --  95*  --   APTT  --   --   --   --  50*  --   --   HEPARINUNFRC  --   --   --   --  >1.10*  --   --   CREATININE 4.27* 4.40*  --   --   --  5.37*  --   TROPONINIHS  --   --  69* 91*  --   --   --     Estimated Creatinine Clearance: 14.6 mL/min (A) (by C-G formula based on SCr of 5.37 mg/dL (H)).   Medical History: Past Medical History:  Diagnosis Date   Anemia    Anxiety    Asthma    Complication from renal dialysis device 11/02/2013   Complication of anesthesia 2017   according to pt and spouse pt was moving around and cough while under and pt had difficulty waking up so the anesthesia had to be reversed. and pt admitted to ICU.    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/2008   Qualifier: Diagnosis of  By: Russella Dar MD Marylu Lund    ESRD    on HD, T-TH-Sat -  [place]   Hemiparesis due to old stroke [facility])    left   HFrEF (heart failure with reduced ejection fraction) (HCC)    NICM // TTE 06/2022: EF 25-30, sever RV dysfunction, mod to severe MR   Hypertension    Hx, not current problems, no meds   LV dysfunction    EF 25-30% by echo 07/2011   Memory loss due to medical condition    due to stroke   MR (mitral regurgitation)    moderate to severe, echo 07/2011   Nausea with vomiting, unspecified 06/15/2021   Paroxysmal atrial fibrillation (HCC)    Peripheral vascular disease (HCC)    Shortness of breath    with exertion   Stroke (HCC)    TIA's-left sided weakness   Tobacco abuse     Medications:  (Not in a hospital admission)   Assessment: 62 yo F presents with SOB and cough. Patient has a history of Afib and stroke and is on apixaban 5mg  BID at home. Pt reports taking all of his apixaban  doses as prescribed and not missing any recent apixaban doses except for this morning's dose. Pt reports last dose of apixaban was 12/18 @ 2300. CTA chest is positive for acute nonocclusive pulmonary embolism in a posterior right lower lobe segmental artery on 12/19. Pharmacy consulted to dose heparin infusion.   Now to transition back to Eliquis, discussed with MD, CT angio likely shows artifact and chronic PE as opposed to acute, will not initiate loading period and continue Eliquis at 5mg  BID  Goal of Therapy:  Heparin level 0.3-0.7 units/ml Monitor platelets by anticoagulation protocol: Yes   Plan:  Eliquis 5mg  PO BID D/c heparin gtt at same time as first dose of Eliquis administered Monitor s/s bleeding  Nathaniel White, PharmD, [facility] Clinical Pharmacist ED Pharmacist Phone # [phone] 12/28/2022 9:03 AM

## 2022-12-28 NOTE — Assessment & Plan Note (Signed)
-   on TThSa, last session Thursday - will notify nephrology as patient will be here at least til Saturday

## 2022-12-29 DIAGNOSIS — Z86711 Personal history of pulmonary embolism: Secondary | ICD-10-CM

## 2022-12-29 LAB — CBC WITH DIFFERENTIAL/PLATELET
Abs Immature Granulocytes: 0.01 10*3/uL (ref 0.00–0.07)
Basophils Absolute: 0 10*3/uL (ref 0.0–0.1)
Basophils Relative: 0 %
Eosinophils Absolute: 0 10*3/uL (ref 0.0–0.5)
Eosinophils Relative: 0 %
HCT: 36.8 % — ABNORMAL LOW (ref 39.0–52.0)
Hemoglobin: 11.1 g/dL — ABNORMAL LOW (ref 13.0–17.0)
Immature Granulocytes: 0 %
Lymphocytes Relative: 6 %
Lymphs Abs: 0.2 10*3/uL — ABNORMAL LOW (ref 0.7–4.0)
MCH: 25.8 pg — ABNORMAL LOW (ref 26.0–34.0)
MCHC: 30.2 g/dL (ref 30.0–36.0)
MCV: 85.4 fL (ref 80.0–100.0)
Monocytes Absolute: 0.2 10*3/uL (ref 0.1–1.0)
Monocytes Relative: 4 %
Neutro Abs: 3.7 10*3/uL (ref 1.7–7.7)
Neutrophils Relative %: 90 %
Platelets: 126 10*3/uL — ABNORMAL LOW (ref 150–400)
RBC: 4.31 MIL/uL (ref 4.22–5.81)
RDW: 16.5 % — ABNORMAL HIGH (ref 11.5–15.5)
WBC: 4.1 10*3/uL (ref 4.0–10.5)
nRBC: 0 % (ref 0.0–0.2)

## 2022-12-29 LAB — RENAL FUNCTION PANEL
Albumin: 2.4 g/dL — ABNORMAL LOW (ref 3.5–5.0)
Anion gap: 15 (ref 5–15)
BUN: 36 mg/dL — ABNORMAL HIGH (ref 6–20)
CO2: 23 mmol/L (ref 22–32)
Calcium: 9.5 mg/dL (ref 8.9–10.3)
Chloride: 96 mmol/L — ABNORMAL LOW (ref 98–111)
Creatinine, Ser: 4.89 mg/dL — ABNORMAL HIGH (ref 0.61–1.24)
GFR, Estimated: 13 mL/min — ABNORMAL LOW
Glucose, Bld: 136 mg/dL — ABNORMAL HIGH (ref 70–99)
Phosphorus: 6.6 mg/dL — ABNORMAL HIGH (ref 2.5–4.6)
Potassium: 4.8 mmol/L (ref 3.5–5.1)
Sodium: 134 mmol/L — ABNORMAL LOW (ref 135–145)

## 2022-12-29 LAB — GLUCOSE, CAPILLARY
Glucose-Capillary: 160 mg/dL — ABNORMAL HIGH (ref 70–99)
Glucose-Capillary: 165 mg/dL — ABNORMAL HIGH (ref 70–99)
Glucose-Capillary: 122 mg/dL — ABNORMAL HIGH (ref 70–99)

## 2022-12-29 LAB — HEPATITIS B SURFACE ANTIBODY, QUANTITATIVE: Hep B S AB Quant (Post): 924 m[IU]/mL

## 2022-12-29 LAB — MAGNESIUM: Magnesium: 2 mg/dL (ref 1.7–2.4)

## 2022-12-29 MED ORDER — PREDNISONE 20 MG PO TABS
40.0000 mg | ORAL_TABLET | Freq: Every day | ORAL | Status: DC
Start: 2022-12-30 — End: 2023-01-01
  Administered 2022-12-30 – 2023-01-01 (×3): 40 mg via ORAL
  Filled 2022-12-29 (×3): qty 2

## 2022-12-29 NOTE — Progress Notes (Signed)
[facility] Progress Note   Subjective:   Patient seen and examined at bedside.  Breathing much better today.  Tolerated dialysis well last night.  Denies CP, SOB, abdominal pain and n/v/d.    Objective Vitals:   12/29/22 0400 12/29/22 0432 12/29/22 0800 12/29/22 0821  BP:   (!) 172/98   Pulse:   68 73  Resp:   (!) 22 19  Temp: 97.7 F (36.5 C)  98 F (36.7 C)   TempSrc:   Oral   SpO2:   100% 97%  Weight:  70.5 kg    Height:  5\' 9"  (1.753 m)     Physical Exam General: chronically ill appearing male in NAD Heart:RRR Lungs:CTAB, nml WOB on 3L O2 Abdomen:soft, NTND Extremities:no LE edema Dialysis Access: [facility]   Filed Weights   12/29/22 0432  Weight: 70.5 kg    Intake/Output Summary (Last 24 hours) at 12/29/2022 1007 Last data filed at 12/29/2022 0527 Gross per 24 hour  Intake 300 ml  Output 3500 ml  Net -3200 ml    Additional Objective Labs: Basic Metabolic Panel: Recent Labs  Lab 12/27/22 1442 12/27/22 1453 12/28/22 0432 12/28/22 0436 12/29/22 0452  NA 135 138 128* 130* 134*  K 3.1* 3.2* 3.9 3.9 4.8  CL 95* 95* 88*  --  96*  CO2 27  --  22  --  23  GLUCOSE 84 85 366*  --  136*  BUN 20 23* 26*  --  36*  CREATININE 4.27* 4.40* 5.37*  --  4.89*  CALCIUM 8.7*  --  8.1*  --  9.5  PHOS  --   --  6.1*  --  6.6*   Liver Function Tests: Recent Labs  Lab 12/27/22 1602 12/28/22 0432 12/29/22 0452  AST 30 29  --   ALT 18 16  --   ALKPHOS 85 77  --   BILITOT 0.9 1.0  --   PROT 7.6 6.9  --   ALBUMIN 2.4* 2.2* 2.4*   No results for input(s): "LIPASE", "AMYLASE" in the last 168 hours. CBC: Recent Labs  Lab 12/27/22 1442 12/27/22 1453 12/28/22 0432 12/28/22 0436 12/29/22 0452  WBC 5.3  --  3.9*  --  4.1  NEUTROABS 3.6  --   --   --  3.7  HGB 11.7*   < > 9.3* 11.2* 11.1*  HCT 38.9*   < > 31.4* 33.0* 36.8*  MCV 85.7  --  86.5  --  85.4  PLT 120*  --  95*  --  126*   < > = values in this interval not displayed.   Blood Culture     Component Value Date/Time   SDES FLUID 09/16/2022 1617   SPECREQUEST NONE 09/16/2022 1617   CULT  09/16/2022 1617    FEW ESCHERICHIA COLI FEW BACTEROIDES OVATUS MODERATE BACTEROIDES FRAGILIS RARE STREPTOCOCCUS ANGINOSIS BETA LACTAMASE POSITIVE Performed at [facility], [address] [place], [place] [address]    REPTSTATUS 09/21/2022 FINAL 09/16/2022 1617    CBG: Recent Labs  Lab 12/28/22 0820 12/28/22 1150 12/28/22 1919 12/29/22 0836  GLUCAP 156* 119* 116* 160*    Studies/Results: VAS [place] LOWER EXTREMITY VENOUS (DVT) Result Date: 12/29/2022  Lower Venous DVT Study Patient Name:  QUINCI GABRYS  Date of Exam:   12/28/2022 Medical Rec #: [id number]        Accession #:    [id number] Date of Birth: Jun 27, 1960        Patient Gender:  M Patient Age:   62 years Exam Location:  [facility] Procedure:      VAS [place] LOWER EXTREMITY VENOUS (DVT) Referring Phys: Newton Pigg --------------------------------------------------------------------------------  Indications: Hx of pulmonary embolism.  Risk Factors: Hx of PE. Anticoagulation: Eliquis. Limitations: Patient clothing. Comparison Study: No significant changes seen since prior exam 09/05/21 Performing Technologist: Shona Simpson  Examination Guidelines: A complete evaluation includes B-mode imaging, spectral Doppler, color Doppler, and power Doppler as needed of all accessible portions of each vessel. Bilateral testing is considered an integral part of a complete examination. Limited examinations for reoccurring indications may be performed as noted. The reflux portion of the exam is performed with the patient in reverse Trendelenburg.  +---------+---------------+---------+-----------+----------+-------------------+ RIGHT    CompressibilityPhasicitySpontaneityPropertiesThrombus Aging      +---------+---------------+---------+-----------+----------+-------------------+ CFV      Full           Yes      Yes                                       +---------+---------------+---------+-----------+----------+-------------------+ SFJ                                                   Not well visualized +---------+---------------+---------+-----------+----------+-------------------+ FV Prox  Full                                                             +---------+---------------+---------+-----------+----------+-------------------+ FV Mid   Full                                                             +---------+---------------+---------+-----------+----------+-------------------+ FV DistalFull                                                             +---------+---------------+---------+-----------+----------+-------------------+ PFV      Full                                                             +---------+---------------+---------+-----------+----------+-------------------+ POP      Full           Yes      Yes                                      +---------+---------------+---------+-----------+----------+-------------------+ PTV      Full                                                             +---------+---------------+---------+-----------+----------+-------------------+  PERO     Full                                                             +---------+---------------+---------+-----------+----------+-------------------+   +---------+---------------+---------+-----------+----------+--------------+ LEFT     CompressibilityPhasicitySpontaneityPropertiesThrombus Aging +---------+---------------+---------+-----------+----------+--------------+ CFV      Full           Yes      Yes                                 +---------+---------------+---------+-----------+----------+--------------+ SFJ      Full                                                        +---------+---------------+---------+-----------+----------+--------------+ FV Prox   Full                                                        +---------+---------------+---------+-----------+----------+--------------+ FV Mid   Full                                                        +---------+---------------+---------+-----------+----------+--------------+ FV DistalFull                                                        +---------+---------------+---------+-----------+----------+--------------+ PFV      Full                                                        +---------+---------------+---------+-----------+----------+--------------+ POP      Full           Yes      Yes                                 +---------+---------------+---------+-----------+----------+--------------+ PTV      Full                                                        +---------+---------------+---------+-----------+----------+--------------+ PERO     Full                                                        +---------+---------------+---------+-----------+----------+--------------+  Summary: BILATERAL: - No evidence of deep vein thrombosis seen in the lower extremities, bilaterally. -No evidence of popliteal cyst, bilaterally.   *See table(s) above for measurements and observations. Electronically signed by Coral Else MD on 12/29/2022 at 10:03:02 AM.    Final    ECHOCARDIOGRAM COMPLETE Result Date: 12/28/2022    ECHOCARDIOGRAM REPORT   Patient Name:   JAMOL REIM Date of Exam: 12/28/2022 Medical Rec #:  [id number]       Height:       69.0 in Accession #:    [id number]      Weight:       170.0 lb Date of Birth:  May 31, 1960       BSA:          1.928 m Patient Age:    62 years        BP:           154/83 mmHg Patient Gender: M               HR:           66 bpm. Exam Location:  Inpatient Procedure: 2D Echo, Cardiac Doppler, Color Doppler and Intracardiac            Opacification Agent Indications:    pulmonary embolism  History:        Patient has  prior history of Echocardiogram examinations, most                 recent 09/05/2021. CHF, COPD and Pulmonary HTN, Arrythmias:Atrial                 Fibrillation; Signs/Symptoms:Shortness of Breath.  Sonographer:    Webb Laws Referring Phys: [id number] JUSTIN B HOWERTER IMPRESSIONS  1. Left ventricular ejection fraction, by estimation, is 35 to 40%. The left ventricle has moderately decreased function. The left ventricle demonstrates global hypokinesis. The left ventricular internal cavity size was mildly dilated. There is mild left ventricular hypertrophy. Left ventricular diastolic parameters are indeterminate.  2. Right ventricular systolic function is moderately reduced. The right ventricular size is mildly enlarged. There is severely elevated pulmonary artery systolic pressure. The estimated right ventricular systolic pressure is 81.6 mmHg.  3. Left atrial size was severely dilated.  4. Right atrial size was mildly dilated.  5. The mitral valve is degenerative. Severe mitral valve regurgitation. Mild mitral stenosis. The mean mitral valve gradient is 4.5 mmHg with average heart rate of 63 bpm. Severe mitral annular calcification.  6. The aortic valve was not well visualized. There is mild calcification of the aortic valve. Aortic valve regurgitation is not visualized. No aortic stenosis is present.  7. The inferior vena cava is dilated in size with <50% respiratory variability, suggesting right atrial pressure of 15 mmHg. FINDINGS  Left Ventricle: Left ventricular ejection fraction, by estimation, is 35 to 40%. The left ventricle has moderately decreased function. The left ventricle demonstrates global hypokinesis. The left ventricular internal cavity size was mildly dilated. There is mild left ventricular hypertrophy. Left ventricular diastolic parameters are indeterminate. Right Ventricle: The right ventricular size is mildly enlarged. No increase in right ventricular wall thickness. Right ventricular  systolic function is moderately reduced. There is severely elevated pulmonary artery systolic pressure. The tricuspid regurgitant velocity is 4.08 m/s, and with an assumed right atrial pressure of 15 mmHg, the estimated right ventricular systolic pressure is 81.6 mmHg. Left Atrium: Left atrial size was severely dilated. Right Atrium: Right atrial size was mildly dilated. Pericardium: There  is no evidence of pericardial effusion. Mitral Valve: The mitral valve is degenerative in appearance. There is severe thickening of the mitral valve leaflet(s). There is severe calcification of the mitral valve leaflet(s). Severe mitral annular calcification. Severe mitral valve regurgitation.  Mild mitral valve stenosis. The mean mitral valve gradient is 4.5 mmHg with average heart rate of 63 bpm. Tricuspid Valve: The tricuspid valve is normal in structure. Tricuspid valve regurgitation is mild . No evidence of tricuspid stenosis. Aortic Valve: The aortic valve was not well visualized. There is mild calcification of the aortic valve. Aortic valve regurgitation is not visualized. No aortic stenosis is present. Pulmonic Valve: The pulmonic valve was not well visualized. Pulmonic valve regurgitation is trivial. No evidence of pulmonic stenosis. Aorta: The aortic root is normal in size and structure. Venous: A pattern of systolic flow reversal, suggestive of severe mitral regurgitation is recorded from the right lower pulmonary vein. The inferior vena cava is dilated in size with less than 50% respiratory variability, suggesting right atrial pressure  of 15 mmHg. IAS/Shunts: The atrial septum is grossly normal.  LEFT VENTRICLE PLAX 2D LVIDd:         5.90 cm   Diastology LVIDs:         3.40 cm   LV e' medial:    3.48 cm/s LV PW:         1.10 cm   LV E/e' medial:  42.2 LV IVS:        1.20 cm   LV e' lateral:   5.33 cm/s LVOT diam:     2.20 cm   LV E/e' lateral: 27.6 LV SV:         56 LV SV Index:   29 LVOT Area:     3.80 cm  RIGHT  VENTRICLE            IVC RV Basal diam:  3.90 cm    IVC diam: 2.20 cm RV S prime:     5.00 cm/s TAPSE (M-mode): 1.2 cm LEFT ATRIUM              Index        RIGHT ATRIUM           Index LA diam:        5.30 cm  2.75 cm/m   RA Area:     17.10 cm LA Vol (A2C):   95.0 ml  49.27 ml/m  RA Volume:   40.40 ml  20.95 ml/m LA Vol (A4C):   104.0 ml 53.94 ml/m LA Biplane Vol: 105.0 ml 54.46 ml/m  AORTIC VALVE LVOT Vmax:   87.70 cm/s LVOT Vmean:  53.200 cm/s LVOT VTI:    0.147 m  AORTA Ao Root diam: 2.80 cm Ao Asc diam:  3.50 cm MITRAL VALVE                  TRICUSPID VALVE MV Area (PHT): 3.79 cm       TR Peak grad:   66.6 mmHg MV Mean grad:  4.5 mmHg       TR Vmax:        408.00 cm/s MR Peak grad:    121.9 mmHg MR Mean grad:    71.0 mmHg    SHUNTS MR Vmax:         552.00 cm/s  Systemic VTI:  0.15 m MR Vmean:        384.0 cm/s   Systemic Diam: 2.20 cm MR PISA:  4.02 cm MR PISA Eff ROA: 26 mm MR PISA Radius:  0.80 cm MV E velocity: 147.00 cm/s MV A velocity: 99.80 cm/s MV E/A ratio:  1.47 Weston Brass MD Electronically signed by Weston Brass MD Signature Date/Time: 12/28/2022/4:30:23 PM    Final    CT Angio Chest PE W and/or Wo Contrast Result Date: 12/27/2022 CLINICAL DATA:  Shortness of breath, worsening after antibiotics, cough EXAM: CT ANGIOGRAPHY CHEST WITH CONTRAST TECHNIQUE: Multidetector CT imaging of the chest was performed using the standard protocol during bolus administration of intravenous contrast. Multiplanar CT image reconstructions and MIPs were obtained to evaluate the vascular anatomy. RADIATION DOSE REDUCTION: This exam was performed according to the departmental dose-optimization program which includes automated exposure control, adjustment of the mA and/or kV according to patient size and/or use of iterative reconstruction technique. CONTRAST:  75mL OMNIPAQUE IOHEXOL 350 MG/ML SOLN COMPARISON:  Same day chest radiograph and CTA chest 09/04/2021 FINDINGS: Cardiovascular: Filling  defect in a posterior right lower lobe segmental artery (series 6/image 153). This is favored to represent a acute pulmonary embolism there is extensive mixing artifact in the right lower lobe pulmonary arteries. Mild cardiomegaly. Coronary artery and aortic atherosclerotic calcification. No evidence of right heart strain. No pericardial effusion. Reflux of contrast into the IVC and hepatic veins compatible with elevated right heart pressures. Dilated main pulmonary artery measuring 34 mm. Mediastinum/Nodes: Trachea and esophagus are unremarkable. Similar mediastinal adenopathy. For example an unchanged 1.2 cm right pretracheal node (6/87). Lungs/Pleura: Interlobular septal thickening and patchy ground-glass opacities with more confluence airspace opacities in the lower lobes. This is similar to 09/04/2021. Right fibrothorax and trace effusion. Small left effusion in the apex. Upper Abdomen: Nodular hepatic contour compatible with cirrhosis. Cholelithiasis. Polycystic kidneys. Musculoskeletal: No acute fracture. Review of the MIP images confirms the above findings. IMPRESSION: 1. Acute nonocclusive pulmonary embolism in a posterior right lower lobe segmental artery. Alternatively this could represent mixing artifact. No evidence of right heart strain. 2. Chronic lung disease similar to 09/04/2021. 3. Marked cardiomegaly and elevated right heart pressures. 4. Dilated main pulmonary artery can be seen with pulmonary arterial hypertension 5. Cirrhosis. Aortic Atherosclerosis (ICD10-I70.0). Electronically Signed   By: Minerva Fester M.D.   On: 12/27/2022 22:41   DG Chest 2 View Result Date: 12/27/2022 CLINICAL DATA:  Shortness of breath, worsening after antibiotics. Cough EXAM: CHEST - 2 VIEW COMPARISON:  11/29/2022 FINDINGS: Stable cardiomegaly.  Aortic atherosclerotic calcification. Left IJ CVC tip in the right atrium. Bilateral airspace and interstitial opacities slightly improved from 11/29/2022. Small  bilateral pleural effusions. No pneumothorax. IMPRESSION: Bilateral airspace and interstitial opacities may be due to edema or infection. Small pleural effusions. Electronically Signed   By: Minerva Fester M.D.   On: 12/27/2022 19:39    Medications:  doxycycline (VIBRAMYCIN) IV 100 mg (12/29/22 0527)    amiodarone  200 mg Oral Daily   apixaban  5 mg Oral BID   Chlorhexidine Gluconate Cloth  6 each Topical Q0600   doxercalciferol  4 mcg Intravenous Q T,Th,Sa-HD   insulin aspart  0-6 Units Subcutaneous TID WC   ipratropium-albuterol  3 mL Nebulization Q6H   methylPREDNISolone (SOLU-MEDROL) injection  80 mg Intravenous Q12H   midodrine  10 mg Oral Q T,Th,Sa-HD   sucroferric oxyhydroxide  1,000 mg Oral BID PC    Dialysis Orders: SW TTS  4h  400/500  68.2kg   2/2 bath  [facility] TDC    Heparin none - last OP HD 12/19, post wt  70.0kg, coming off 0-3 kg over the last 3 wks - mircera 50 mcg q2, last 12/10, due 12/24 - hectorol 4 mcg  Assessment/Plan: 1. Acute on chronic hypoxic respiratory failure - on home O2 2-2.5L. Pulmonary edema on initial CXR.  On Doxy. Breathing better today after extra HD, no crackles.  Remains on 3L O2.  Will reassess respiratory status in AM to assess if additional HD needed off schedule.  May need lower dry weight.  2. Acute PE - seen on CTA. Heparin infusion. Per PMD.  3. ESRD - on TTS.  Extra HD yesterday.  No acute indications for HD today and IV fluid shortage so avoiding unnecessary back to back HD. Holiday schedule this week running Mon, [place], Sat.  Next HD either tomorrow or Monday depending on respiratory status.  4. Anemia of CKD- Hgb 11.1. No indication for ESA.  5. Secondary hyperparathyroidism - Calcium ok. Phos elevated.  Continue VDRA and binders.  6. HYPOtension- BP in goal. On midodrine.   7. Nutrition - Renal diet w/fluid restrictions.  8. Prolonged QTc - per PMD 9. A fib 10. DMT2 11. Chronic systolic HF - EF 21-30%  Virgina Norfolk,  PA-C [facility] 12/29/2022,10:07 AM  LOS: 2 days

## 2022-12-29 NOTE — Progress Notes (Signed)
Progress Note    Nathaniel White   ZOX:[id number]  DOB: 1960/03/13  DOA: 12/27/2022     2 PCP: Claiborne Rigg, NP  Initial CC: dyspnea  Hospital Course: Nathaniel White is a 62 year old male with PMH PE (08/2021), PAF, ESRD on HD, COPD, chronic hypoxic respiratory failure on 2 L, marked chronic lung disease, cirrhosis who presented with cough and worsening shortness of breath. He was found to be hypoxic and oxygen was increased to 4 L on admission. He was admitted to undergo further workup regarding his dyspnea.  Interval History:   Patient in bed, appears comfortable, denies any headache, no fever, no chest pain or pressure, no shortness of breath , no abdominal pain. No new focal weakness.   Assessment and Plan:  Acute on chronic hypoxic respiratory failure due to rhinovirus pneumonia the setting of chronic PE which is adequately treated by Eliquis.  Uses 2 L nasal cannula oxygen at home, currently on 4 L, clinically much improved, encouraged to sit in chair use I-S and flutter valve for pulmonary toiletry, advance activity and titrate down oxygen.  Taper off steroids.  Continue Eliquis for underlying DVT PE.  Monitor if continues to improve discharge on 12/30/2022.   History of pulmonary embolism -Devious MD reviewed with radiology.  PE noted in the right lower lobe is similar in appearance from PE noted August 2023 with no changes.  Suspect this is chronic PE that has not recannulized the vessel; which is normal progression continue Eliquis.  COPD with acute exacerbation (HCC) - Suspected exacerbation from rhinovirus - Continue breathing treatments and steroids, start tapering steroids no wheezing now.  Chronic hypoxic respiratory failure (HCC) - On 2 L chronic oxygen at home - Increased to 4 L on admission in setting of COPD exacerbation - Wean back to home setting as able  End-stage renal disease on hemodialysis (HCC) - on TThSa, last session Thursday - will notify  nephrology as patient will be here at least til Saturday  Paroxysmal atrial fibrillation (HCC) - Continue amiodarone and Eliquis  Elevated troponin - Chronically elevated and at baseline in setting of underlying ESRD -No chest pain or EKG changes noted  Chronic systolic CHF (congestive heart failure) (HCC) - No signs or symptoms of exacerbation - Last echo from 06/19/2022: EF 25 to 30%, no RWMA, indeterminate diastolic function; moderate to severe MR  Hypokalemia - Replete as needed  DM2 (diabetes mellitus, type 2) (HCC) - Continue SSI and CBG monitoring   Old records reviewed in assessment of this patient  Antimicrobials: Doxy 12/28/2022 >> current  DVT prophylaxis:   apixaban (ELIQUIS) tablet 5 mg   Code Status:   Code Status: Full Code  Mobility Assessment (Last 72 Hours)     Mobility Assessment     Row Name 12/29/22 0852 12/29/22 0400 12/29/22 0000 12/28/22 2300 12/28/22 2100   Does patient have an order for bedrest or is patient medically unstable No - Continue assessment No - Continue assessment No - Continue assessment No - Continue assessment No - Continue assessment   What is the highest level of mobility based on the progressive mobility assessment? -- Level 2 (Chairfast) - Balance while sitting on edge of bed and cannot stand Level 2 (Chairfast) - Balance while sitting on edge of bed and cannot stand -- Level 2 (Chairfast) - Balance while sitting on edge of bed and cannot stand   Is the above level different from baseline mobility prior to current illness? -- Yes - Recommend  PT order Yes - Recommend PT order -- Yes - Recommend PT order    Row Name 12/28/22 0500           Does patient have an order for bedrest or is patient medically unstable No - Continue assessment                Barriers to discharge: none Disposition Plan:  Home Status is: Inpt  Objective: Blood pressure (!) 172/98, pulse 73, temperature 98 F (36.7 C), temperature source Oral,  resp. rate 19, height 5\' 9"  (1.753 m), weight 70.5 kg, SpO2 97%.   Exam  Awake Alert, No new F.N deficits, Normal affect [place].AT,PERRAL Supple Neck, No JVD,   Symmetrical Chest wall movement, Good air movement bilaterally, CTAB RRR,No Gallops, Rubs or new Murmurs,  +ve B.Sounds, Abd Soft, No tenderness,   No Cyanosis, Clubbing or edema    Consultants:  Nephrology  Procedures:    Data Reviewed:  Recent Labs  Lab 12/27/22 1442 12/27/22 1453 12/28/22 0432 12/28/22 0436 12/29/22 0452  WBC 5.3  --  3.9*  --  4.1  HGB 11.7* 13.9 9.3* 11.2* 11.1*  HCT 38.9* 41.0 31.4* 33.0* 36.8*  PLT 120*  --  95*  --  126*  MCV 85.7  --  86.5  --  85.4  MCH 25.8*  --  25.6*  --  25.8*  MCHC 30.1  --  29.6*  --  30.2  RDW 16.9*  --  16.8*  --  16.5*  LYMPHSABS 0.7  --   --   --  0.2*  MONOABS 0.7  --   --   --  0.2  EOSABS 0.3  --   --   --  0.0  BASOSABS 0.0  --   --   --  0.0    Recent Labs  Lab 12/27/22 1442 12/27/22 1443 12/27/22 1453 12/27/22 1602 12/27/22 2344 12/28/22 0432 12/28/22 0436 12/28/22 0908 12/29/22 0452  NA 135  --  138  --   --  128* 130*  --  134*  K 3.1*  --  3.2*  --   --  3.9 3.9  --  4.8  CL 95*  --  95*  --   --  88*  --   --  96*  CO2 27  --   --   --   --  22  --   --  23  ANIONGAP 13  --   --   --   --  18*  --   --  15  GLUCOSE 84  --  85  --   --  366*  --   --  136*  BUN 20  --  23*  --   --  26*  --   --  36*  CREATININE 4.27*  --  4.40*  --   --  5.37*  --   --  4.89*  AST  --   --   --  30  --  29  --   --   --   ALT  --   --   --  18  --  16  --   --   --   ALKPHOS  --   --   --  85  --  77  --   --   --   BILITOT  --   --   --  0.9  --  1.0  --   --   --  ALBUMIN  --   --   --  2.4*  --  2.2*  --   --  2.4*  PROCALCITON  --   --   --   --   --   --   --  1.42  --   BNP  --  2,332.5*  --   --   --   --   --   --   --   MG  --   --   --   --  1.8 1.8  --   --  2.0  CALCIUM 8.7*  --   --   --   --  8.1*  --   --  9.5     Recent Labs   Lab 12/27/22 1442 12/27/22 1443 12/27/22 2344 12/28/22 0432 12/28/22 0908 12/29/22 0452  PROCALCITON  --   --   --   --  1.42  --   BNP  --  2,332.5*  --   --   --   --   MG  --   --  1.8 1.8  --  2.0  CALCIUM 8.7*  --   --  8.1*  --  9.5        LOS: 2 days   Susa Raring, MD [facility] 12/29/2022, 11:09 AM

## 2022-12-30 DIAGNOSIS — Z86711 Personal history of pulmonary embolism: Secondary | ICD-10-CM | POA: Diagnosis not present

## 2022-12-30 LAB — RENAL FUNCTION PANEL
Albumin: 2.4 g/dL — ABNORMAL LOW (ref 3.5–5.0)
Anion gap: 19 — ABNORMAL HIGH (ref 5–15)
BUN: 63 mg/dL — ABNORMAL HIGH (ref 6–20)
CO2: 20 mmol/L — ABNORMAL LOW (ref 22–32)
Calcium: 9.5 mg/dL (ref 8.9–10.3)
Chloride: 96 mmol/L — ABNORMAL LOW (ref 98–111)
Creatinine, Ser: 7.48 mg/dL — ABNORMAL HIGH (ref 0.61–1.24)
GFR, Estimated: 8 mL/min — ABNORMAL LOW (ref >60)
Glucose, Bld: 130 mg/dL — ABNORMAL HIGH (ref 70–99)
Phosphorus: 8.7 mg/dL — ABNORMAL HIGH (ref 2.5–4.6)
Potassium: 4.5 mmol/L (ref 3.5–5.1)
Sodium: 135 mmol/L (ref 135–145)

## 2022-12-30 LAB — CBC WITH DIFFERENTIAL/PLATELET
Abs Immature Granulocytes: 0.02 10*3/uL (ref 0.00–0.07)
Basophils Absolute: 0 10*3/uL (ref 0.0–0.1)
Basophils Relative: 0 %
Eosinophils Absolute: 0 10*3/uL (ref 0.0–0.5)
Eosinophils Relative: 0 %
HCT: 35.8 % — ABNORMAL LOW (ref 39.0–52.0)
Hemoglobin: 10.9 g/dL — ABNORMAL LOW (ref 13.0–17.0)
Immature Granulocytes: 0 %
Lymphocytes Relative: 4 %
Lymphs Abs: 0.2 10*3/uL — ABNORMAL LOW (ref 0.7–4.0)
MCH: 25.6 pg — ABNORMAL LOW (ref 26.0–34.0)
MCHC: 30.4 g/dL (ref 30.0–36.0)
MCV: 84.2 fL (ref 80.0–100.0)
Monocytes Absolute: 0.3 10*3/uL (ref 0.1–1.0)
Monocytes Relative: 5 %
Neutro Abs: 5.1 10*3/uL (ref 1.7–7.7)
Neutrophils Relative %: 91 %
Platelets: 129 10*3/uL — ABNORMAL LOW (ref 150–400)
RBC: 4.25 MIL/uL (ref 4.22–5.81)
RDW: 16.5 % — ABNORMAL HIGH (ref 11.5–15.5)
WBC: 5.6 10*3/uL (ref 4.0–10.5)
nRBC: 0 % (ref 0.0–0.2)

## 2022-12-30 LAB — GLUCOSE, CAPILLARY
Glucose-Capillary: 120 mg/dL — ABNORMAL HIGH (ref 70–99)
Glucose-Capillary: 95 mg/dL (ref 70–99)
Glucose-Capillary: 129 mg/dL — ABNORMAL HIGH (ref 70–99)

## 2022-12-30 LAB — MAGNESIUM: Magnesium: 2 mg/dL (ref 1.7–2.4)

## 2022-12-30 MED ORDER — ALTEPLASE 2 MG IJ SOLR: 2.0000 mg | Freq: Once | INTRAMUSCULAR | Status: DC | PRN

## 2022-12-30 MED ORDER — HEPARIN SODIUM (PORCINE) 1000 UNIT/ML IJ SOLN
4200.0000 [IU] | Freq: Once | INTRAMUSCULAR | Status: AC
  Administered 2022-12-30: 4200 [IU]
  Filled 2022-12-30: qty 5

## 2022-12-30 MED ORDER — PENTAFLUOROPROP-TETRAFLUOROETH EX AERO: 1.0000 | INHALATION_SPRAY | CUTANEOUS | Status: DC | PRN

## 2022-12-30 MED ORDER — HEPARIN SODIUM (PORCINE) 1000 UNIT/ML DIALYSIS: 1000.0000 [IU] | INTRAMUSCULAR | Status: DC | PRN

## 2022-12-30 MED ORDER — ANTICOAGULANT SODIUM CITRATE 4% (200MG/5ML) IV SOLN: 5.0000 mL | Status: DC | PRN

## 2022-12-30 MED ORDER — LIDOCAINE-PRILOCAINE 2.5-2.5 % EX CREA: 1.0000 | TOPICAL_CREAM | CUTANEOUS | Status: DC | PRN

## 2022-12-30 MED ORDER — CHLORHEXIDINE GLUCONATE CLOTH 2 % EX PADS
6.0000 | MEDICATED_PAD | Freq: Every day | CUTANEOUS | Status: DC
  Administered 2022-12-31 – 2023-01-01 (×2): 6 via TOPICAL

## 2022-12-30 MED ORDER — LIDOCAINE HCL (PF) 1 % IJ SOLN: 5.0000 mL | INTRAMUSCULAR | Status: DC | PRN

## 2022-12-30 MED ORDER — CHLORHEXIDINE GLUCONATE CLOTH 2 % EX PADS
6.0000 | MEDICATED_PAD | Freq: Every day | CUTANEOUS | Status: DC
  Administered 2022-12-30: 6 via TOPICAL

## 2022-12-30 MED ORDER — CHLORHEXIDINE GLUCONATE CLOTH 2 % EX PADS: 6.0000 | MEDICATED_PAD | Freq: Every day | CUTANEOUS | Status: DC

## 2022-12-30 NOTE — Plan of Care (Signed)
  Problem: Coping: Goal: Ability to adjust to condition or change in health will improve Outcome: Not Progressing

## 2022-12-30 NOTE — Progress Notes (Signed)
[facility] Progress Note   Subjective:   Patient seen and examined at bedside.  Feeling a little more SOB today, plan for HD.  Denies CP, SOB, abdominal pain and n/v/d.   Objective Vitals:   12/30/22 1200 12/30/22 1230 12/30/22 1300 12/30/22 1330  BP: (!) 150/83 (!) 135/92 (!) 130/90 (!) 140/91  Pulse: 87 95 89 94  Resp: (!) 21 (!) 23 19 18   Temp:      TempSrc:      SpO2: 100% 100% 100% 100%  Weight:      Height:       Physical Exam General:WDWN, pleasant male in NAD Heart:RRR, no mrg Lungs:CTAB, increased WOB on 4L O2 Abdomen:soft, NTND Extremities:no LE edema Dialysis Access: [facility]   Filed Weights   12/29/22 0432 12/30/22 1122  Weight: 70.5 kg 69.6 kg    Intake/Output Summary (Last 24 hours) at 12/30/2022 1350 Last data filed at 12/30/2022 0930 Gross per 24 hour  Intake 610 ml  Output --  Net 610 ml    Additional Objective Labs: Basic Metabolic Panel: Recent Labs  Lab 12/28/22 0432 12/28/22 0436 12/29/22 0452 12/30/22 0414  NA 128* 130* 134* 135  K 3.9 3.9 4.8 4.5  CL 88*  --  96* 96*  CO2 22  --  23 20*  GLUCOSE 366*  --  136* 130*  BUN 26*  --  36* 63*  CREATININE 5.37*  --  4.89* 7.48*  CALCIUM 8.1*  --  9.5 9.5  PHOS 6.1*  --  6.6* 8.7*   Liver Function Tests: Recent Labs  Lab 12/27/22 1602 12/28/22 0432 12/29/22 0452 12/30/22 0414  AST 30 29  --   --   ALT 18 16  --   --   ALKPHOS 85 77  --   --   BILITOT 0.9 1.0  --   --   PROT 7.6 6.9  --   --   ALBUMIN 2.4* 2.2* 2.4* 2.4*   CBC: Recent Labs  Lab 12/27/22 1442 12/27/22 1453 12/28/22 0432 12/28/22 0436 12/29/22 0452 12/30/22 0414  WBC 5.3  --  3.9*  --  4.1 5.6  NEUTROABS 3.6  --   --   --  3.7 5.1  HGB 11.7*   < > 9.3* 11.2* 11.1* 10.9*  HCT 38.9*   < > 31.4* 33.0* 36.8* 35.8*  MCV 85.7  --  86.5  --  85.4 84.2  PLT 120*  --  95*  --  126* 129*   < > = values in this interval not displayed.   Blood Culture    Component Value Date/Time   SDES FLUID  09/16/2022 1617   SPECREQUEST NONE 09/16/2022 1617   CULT  09/16/2022 1617    FEW ESCHERICHIA COLI FEW BACTEROIDES OVATUS MODERATE BACTEROIDES FRAGILIS RARE STREPTOCOCCUS ANGINOSIS BETA LACTAMASE POSITIVE Performed at [facility], [address] [place], [place] [address]    REPTSTATUS 09/21/2022 FINAL 09/16/2022 1617    CBG: Recent Labs  Lab 12/28/22 1919 12/29/22 0836 12/29/22 1146 12/29/22 1617 12/30/22 0748  GLUCAP 116* 160* 165* 122* 120*    Medications:  anticoagulant sodium citrate     doxycycline (VIBRAMYCIN) IV Stopped (12/30/22 0630)    amiodarone  200 mg Oral Daily   apixaban  5 mg Oral BID   Chlorhexidine Gluconate Cloth  6 each Topical Q0600   Chlorhexidine Gluconate Cloth  6 each Topical Q0600   doxercalciferol  4 mcg Intravenous Q T,Th,Sa-HD   insulin  aspart  0-6 Units Subcutaneous TID WC   ipratropium-albuterol  3 mL Nebulization Q6H   midodrine  10 mg Oral Q T,Th,Sa-HD   predniSONE  40 mg Oral Q breakfast   sucroferric oxyhydroxide  1,000 mg Oral BID PC    Dialysis Orders: SW TTS  4h  400/500  68.2kg   2/2 bath  [facility] TDC    Heparin none - last OP HD 12/19, post wt 70.0kg, coming off 0-3 kg over the last 3 wks - mircera 50 mcg q2, last 12/10, due 12/24 - hectorol 4 mcg   Assessment/Plan: 1. Acute on chronic hypoxic respiratory failure - on home O2 2-2.5L. Pulmonary edema on initial CXR.  On Doxy. Breathing better today after extra HD, no crackles.  Remains on 3L O2.  Will reassess respiratory status in AM to assess if additional HD needed off schedule.  May need lower dry weight.  2. Acute PE - seen on CTA. Heparin infusion. Per PMD.  3. ESRD - on TTS.  Extra HD on Friday.  No acute indications for HD yesterday.  Increased WOB on exam today.  Plan for extra HD today.  Holiday schedule this week running Mon, [place], Sat.  Next HD Monday.  4. Anemia of CKD- Hgb 11.1. No indication for ESA.  5. Secondary hyperparathyroidism - Calcium ok.  Phos elevated.  Continue VDRA and binders.  6. HYPOtension- BP in goal. On midodrine.   7. Nutrition - Renal diet w/fluid restrictions.  8. Prolonged QTc - per PMD 9. A fib 10. DMT2 11. Chronic systolic HF - EF 62-95%  Virgina Norfolk, PA-C [facility] 12/30/2022,1:50 PM  LOS: 3 days

## 2022-12-30 NOTE — Progress Notes (Signed)
Received patient in bed.Awake,alert and oriented x 4.Consent verified. Sob at rest,with oxygen 3-4 lpm/[place] which is his baseline.  Access used: Left HD catheter that worked well.Dressing on date.  Duration of treatment: 3.,5 hours.  Net UF : 2.5 L  Tolerated treatment : Yes.  Medicine given: Midodrine 10 mg.                           Hectorol  mcg.  Hand off to the patients's nurse.Back into his room with stable medical condition

## 2022-12-30 NOTE — Plan of Care (Signed)

## 2022-12-30 NOTE — Progress Notes (Signed)
Progress Note    Nathaniel White   ZOX:[id number]  DOB: 31-May-1960  DOA: 12/27/2022     3 PCP: Claiborne Rigg, NP  Initial CC: dyspnea  Hospital Course: Nathaniel White is a 62 year old male with PMH PE (08/2021), PAF, ESRD on HD, COPD, chronic hypoxic respiratory failure on 2 L, marked chronic lung disease, cirrhosis who presented with cough and worsening shortness of breath. He was found to be hypoxic and oxygen was increased to 4 L on admission. He was admitted to undergo further workup regarding his dyspnea.  Interval History:   In bed slightly more short of breath today, no headache or chest pain, no focal deficits, missed his dialysis yesterday.  Assessment and Plan:  Acute on chronic hypoxic respiratory failure due to rhinovirus pneumonia the setting of chronic PE which is adequately treated by Eliquis.  Uses 2 L nasal cannula oxygen at home, currently on 4 L, clinically much improved, encouraged to sit in chair use I-S and flutter valve for pulmonary toiletry, advance activity and titrate down oxygen.  Taper off steroids.  Continue Eliquis for underlying DVT PE.  Fortunately more short of breath on 12/30/2022 due to below.  End-stage renal disease on hemodialysis (HCC) - on TThSa, last session Thursday -Nephrology on board, unfortunately he missed his dialysis on 12/29/2022 due to staffing issues, more short of breath on 12/30/2022, have discussed with nephrology they will try and dialyze him later today.  History of pulmonary embolism -Devious MD reviewed with radiology.  PE noted in the right lower lobe is similar in appearance from PE noted August 2023 with no changes.  Suspect this is chronic PE that has not recannulized the vessel; which is normal progression continue Eliquis.  COPD with acute exacerbation (HCC) - Suspected exacerbation from rhinovirus - Continue breathing treatments and steroids, start tapering steroids no wheezing now.  Chronic hypoxic respiratory  failure (HCC) - On 2 L chronic oxygen at home - Increased to 4 L on admission in setting of COPD exacerbation - Wean back to home setting as able  Paroxysmal atrial fibrillation (HCC) - Continue amiodarone and Eliquis  Elevated troponin - Chronically elevated and at baseline in setting of underlying ESRD -No chest pain or EKG changes noted  Chronic systolic CHF (congestive heart failure) (HCC) - No signs or symptoms of exacerbation - Last echo from 06/19/2022: EF 25 to 30%, no RWMA, indeterminate diastolic function; moderate to severe MR  Hypokalemia - Replete as needed  DM2 (diabetes mellitus, type 2) (HCC) - Continue SSI and CBG monitoring  CBG (last 3)  Recent Labs    12/29/22 1146 12/29/22 1617 12/30/22 0748  GLUCAP 165* 122* 120*     Antimicrobials: Doxy 12/28/2022 >> current  DVT prophylaxis:   apixaban (ELIQUIS) tablet 5 mg   Code Status:   Code Status: Full Code  Mobility Assessment (Last 72 Hours)     Mobility Assessment     Row Name 12/30/22 0100 12/30/22 0000 12/29/22 2000 12/29/22 0852 12/29/22 0400   Does patient have an order for bedrest or is patient medically unstable No - Continue assessment No - Continue assessment No - Continue assessment No - Continue assessment No - Continue assessment   What is the highest level of mobility based on the progressive mobility assessment? -- Level 2 (Chairfast) - Balance while sitting on edge of bed and cannot stand Level 2 (Chairfast) - Balance while sitting on edge of bed and cannot stand Level 2 (Chairfast) - Balance while sitting  on edge of bed and cannot stand Level 2 (Chairfast) - Balance while sitting on edge of bed and cannot stand   Is the above level different from baseline mobility prior to current illness? Yes - Recommend PT order Yes - Recommend PT order Yes - Recommend PT order -- Yes - Recommend PT order    Row Name 12/29/22 0000 12/28/22 2300 12/28/22 2100 12/28/22 0500     Does patient have an  order for bedrest or is patient medically unstable No - Continue assessment No - Continue assessment No - Continue assessment No - Continue assessment    What is the highest level of mobility based on the progressive mobility assessment? Level 2 (Chairfast) - Balance while sitting on edge of bed and cannot stand -- Level 2 (Chairfast) - Balance while sitting on edge of bed and cannot stand --    Is the above level different from baseline mobility prior to current illness? Yes - Recommend PT order -- Yes - Recommend PT order --             Barriers to discharge: none Disposition Plan:  Home Status is: Inpt  Objective: Blood pressure (!) 155/83, pulse 67, temperature 98.1 F (36.7 C), resp. rate 18, height 5\' 9"  (1.753 m), weight 70.5 kg, SpO2 96%.   Exam  Awake Alert, No new F.N deficits, Normal affect [place].AT,PERRAL Supple Neck, No JVD,   Symmetrical Chest wall movement, Good air movement bilaterally, ++ rales RRR,No Gallops, Rubs or new Murmurs,  +ve B.Sounds, Abd Soft, No tenderness,   No Cyanosis, Clubbing or edema    Consultants:  Nephrology  Procedures:    Data Reviewed:  Recent Labs  Lab 12/27/22 1442 12/27/22 1453 12/28/22 0432 12/28/22 0436 12/29/22 0452 12/30/22 0414  WBC 5.3  --  3.9*  --  4.1 5.6  HGB 11.7* 13.9 9.3* 11.2* 11.1* 10.9*  HCT 38.9* 41.0 31.4* 33.0* 36.8* 35.8*  PLT 120*  --  95*  --  126* 129*  MCV 85.7  --  86.5  --  85.4 84.2  MCH 25.8*  --  25.6*  --  25.8* 25.6*  MCHC 30.1  --  29.6*  --  30.2 30.4  RDW 16.9*  --  16.8*  --  16.5* 16.5*  LYMPHSABS 0.7  --   --   --  0.2* 0.2*  MONOABS 0.7  --   --   --  0.2 0.3  EOSABS 0.3  --   --   --  0.0 0.0  BASOSABS 0.0  --   --   --  0.0 0.0    Recent Labs  Lab 12/27/22 1442 12/27/22 1443 12/27/22 1453 12/27/22 1602 12/27/22 2344 12/28/22 0432 12/28/22 0436 12/28/22 0908 12/29/22 0452 12/30/22 0414  NA 135  --  138  --   --  128* 130*  --  134* 135  K 3.1*  --  3.2*  --   --   3.9 3.9  --  4.8 4.5  CL 95*  --  95*  --   --  88*  --   --  96* 96*  CO2 27  --   --   --   --  22  --   --  23 20*  ANIONGAP 13  --   --   --   --  18*  --   --  15 19*  GLUCOSE 84  --  85  --   --  366*  --   --  136* 130*  BUN 20  --  23*  --   --  26*  --   --  36* 63*  CREATININE 4.27*  --  4.40*  --   --  5.37*  --   --  4.89* 7.48*  AST  --   --   --  30  --  29  --   --   --   --   ALT  --   --   --  18  --  16  --   --   --   --   ALKPHOS  --   --   --  85  --  77  --   --   --   --   BILITOT  --   --   --  0.9  --  1.0  --   --   --   --   ALBUMIN  --   --   --  2.4*  --  2.2*  --   --  2.4* 2.4*  PROCALCITON  --   --   --   --   --   --   --  1.42  --   --   BNP  --  2,332.5*  --   --   --   --   --   --   --   --   MG  --   --   --   --  1.8 1.8  --   --  2.0 2.0  CALCIUM 8.7*  --   --   --   --  8.1*  --   --  9.5 9.5     Recent Labs  Lab 12/27/22 1442 12/27/22 1443 12/27/22 2344 12/28/22 0432 12/28/22 0908 12/29/22 0452 12/30/22 0414  PROCALCITON  --   --   --   --  1.42  --   --   BNP  --  2,332.5*  --   --   --   --   --   MG  --   --  1.8 1.8  --  2.0 2.0  CALCIUM 8.7*  --   --  8.1*  --  9.5 9.5      LOS: 3 days   Signature  -    Susa Raring M.D on 12/30/2022 at 8:54 AM   -  To page go to [email or web address]

## 2022-12-31 DIAGNOSIS — Z86711 Personal history of pulmonary embolism: Secondary | ICD-10-CM | POA: Diagnosis not present

## 2022-12-31 LAB — RENAL FUNCTION PANEL
Albumin: 2.5 g/dL — ABNORMAL LOW (ref 3.5–5.0)
Albumin: 2.4 g/dL — ABNORMAL LOW (ref 3.5–5.0)
Anion gap: 14 (ref 5–15)
Anion gap: 17 — ABNORMAL HIGH (ref 5–15)
BUN: 51 mg/dL — ABNORMAL HIGH (ref 6–20)
BUN: 74 mg/dL — ABNORMAL HIGH (ref 6–20)
CO2: 23 mmol/L (ref 22–32)
CO2: 22 mmol/L (ref 22–32)
Calcium: 9.4 mg/dL (ref 8.9–10.3)
Calcium: 9.4 mg/dL (ref 8.9–10.3)
Chloride: 94 mmol/L — ABNORMAL LOW (ref 98–111)
Chloride: 94 mmol/L — ABNORMAL LOW (ref 98–111)
Creatinine, Ser: 5.58 mg/dL — ABNORMAL HIGH (ref 0.61–1.24)
Creatinine, Ser: 7.82 mg/dL — ABNORMAL HIGH (ref 0.61–1.24)
GFR, Estimated: 11 mL/min — ABNORMAL LOW (ref >60)
GFR, Estimated: 7 mL/min — ABNORMAL LOW (ref >60)
Glucose, Bld: 129 mg/dL — ABNORMAL HIGH (ref 70–99)
Glucose, Bld: 147 mg/dL — ABNORMAL HIGH (ref 70–99)
Phosphorus: 6.5 mg/dL — ABNORMAL HIGH (ref 2.5–4.6)
Phosphorus: 7.4 mg/dL — ABNORMAL HIGH (ref 2.5–4.6)
Potassium: 4.5 mmol/L (ref 3.5–5.1)
Potassium: 4.3 mmol/L (ref 3.5–5.1)
Sodium: 131 mmol/L — ABNORMAL LOW (ref 135–145)
Sodium: 133 mmol/L — ABNORMAL LOW (ref 135–145)

## 2022-12-31 LAB — CBC WITH DIFFERENTIAL/PLATELET
Abs Immature Granulocytes: 0.02 10*3/uL (ref 0.00–0.07)
Basophils Absolute: 0 10*3/uL (ref 0.0–0.1)
Basophils Relative: 0 %
Eosinophils Absolute: 0 10*3/uL (ref 0.0–0.5)
Eosinophils Relative: 0 %
HCT: 39.9 % (ref 39.0–52.0)
Hemoglobin: 12.1 g/dL — ABNORMAL LOW (ref 13.0–17.0)
Immature Granulocytes: 0 %
Lymphocytes Relative: 6 %
Lymphs Abs: 0.4 10*3/uL — ABNORMAL LOW (ref 0.7–4.0)
MCH: 25.3 pg — ABNORMAL LOW (ref 26.0–34.0)
MCHC: 30.3 g/dL (ref 30.0–36.0)
MCV: 83.5 fL (ref 80.0–100.0)
Monocytes Absolute: 0.7 10*3/uL (ref 0.1–1.0)
Monocytes Relative: 9 %
Neutro Abs: 6.6 10*3/uL (ref 1.7–7.7)
Neutrophils Relative %: 85 %
Platelets: 135 10*3/uL — ABNORMAL LOW (ref 150–400)
RBC: 4.78 MIL/uL (ref 4.22–5.81)
RDW: 16.4 % — ABNORMAL HIGH (ref 11.5–15.5)
WBC: 7.7 10*3/uL (ref 4.0–10.5)
nRBC: 0 % (ref 0.0–0.2)

## 2022-12-31 LAB — GLUCOSE, CAPILLARY
Glucose-Capillary: 114 mg/dL — ABNORMAL HIGH (ref 70–99)
Glucose-Capillary: 136 mg/dL — ABNORMAL HIGH (ref 70–99)
Glucose-Capillary: 140 mg/dL — ABNORMAL HIGH (ref 70–99)
Glucose-Capillary: 137 mg/dL — ABNORMAL HIGH (ref 70–99)

## 2022-12-31 LAB — MAGNESIUM: Magnesium: 1.9 mg/dL (ref 1.7–2.4)

## 2022-12-31 MED ORDER — LIDOCAINE-PRILOCAINE 2.5-2.5 % EX CREA: 1.0000 | TOPICAL_CREAM | CUTANEOUS | Status: DC | PRN

## 2022-12-31 MED ORDER — DOXYCYCLINE HYCLATE 100 MG PO TABS
100.0000 mg | ORAL_TABLET | Freq: Two times a day (BID) | ORAL | Status: DC
Start: 2022-12-31 — End: 2023-01-01
  Administered 2022-12-31 – 2023-01-01 (×2): 100 mg via ORAL
  Filled 2022-12-31 (×2): qty 1

## 2022-12-31 MED ORDER — ANTICOAGULANT SODIUM CITRATE 4% (200MG/5ML) IV SOLN: 5.0000 mL | Status: DC | PRN

## 2022-12-31 MED ORDER — PENTAFLUOROPROP-TETRAFLUOROETH EX AERO: 1.0000 | INHALATION_SPRAY | CUTANEOUS | Status: DC | PRN

## 2022-12-31 MED ORDER — HEPARIN SODIUM (PORCINE) 1000 UNIT/ML DIALYSIS
1000.0000 [IU] | INTRAMUSCULAR | Status: DC | PRN
  Filled 2022-12-31 (×4): qty 1

## 2022-12-31 MED ORDER — LIDOCAINE HCL (PF) 1 % IJ SOLN: 5.0000 mL | INTRAMUSCULAR | Status: DC | PRN

## 2022-12-31 NOTE — Progress Notes (Signed)
[facility] Progress Note   Dialysis Orders: SW TTS  4h  400/500  68.2kg   2/2 bath  [facility]    Heparin none - last OP HD 12/19, post wt 70.0kg, coming off 0-3 kg over the last 3 wks - mircera 50 mcg q2, last 12/10, due 12/24 - hectorol 4 mcg   Assessment/Plan: 1. Acute on chronic hypoxic respiratory failure - on home O2 2-2.5L. Pulmonary edema on initial CXR.  On Doxy. Breathing better today after extra HD, no crackles.  Remains on 5 L O2.    May need lower dry weight.  2. Acute PE - seen on CTA. Heparin infusion. Per PMD.  3. ESRD - on TTS.  Extra HD on Friday.  No acute indications for HD yesterday.  Increased WOB on exam today.  Extra HD on Sunday with 2.5 L ultrafiltration.  Holiday schedule this week running Mon, [place], Sat.  next HD Today.  4. Anemia of CKD- Hgb 11.1. No indication for ESA.  5. Secondary hyperparathyroidism - Calcium ok. Phos elevated.  Continue VDRA and binders.  6. HYPOtension- BP in goal. On midodrine.   7. Nutrition - Renal diet w/fluid restrictions.  8. Prolonged QTc - per PMD 9. A fib 10. DMT2 11. Chronic systolic HF - EF 16-10%   Subjective:   Patient seen and examined at bedside.  SOB improved after an extra dialysis treatment yesterday with approximately 2.5 L of net UF.  Plan for HD today on holiday schedule.  Denies CP, SOB, abdominal pain and n/v/d.  On 5 L nasal cannula.  Objective Vitals:   12/30/22 2015 12/30/22 2349 12/31/22 0104 12/31/22 0400  BP:  130/77  (!) 176/89  Pulse:  79 85 78  Resp:    18  Temp:  98.7 F (37.1 C)    TempSrc:  Oral    SpO2: 100% 100% 96% 98%  Weight:      Height:       Physical Exam General:WDWN, pleasant male in NAD, speaking in full sentences Heart:RRR, no mrg Lungs:CTAB, NL WOB on 5L O2 Abdomen:soft, NTND Extremities:no LE edema Dialysis Access: [facility] TDC, thrombosed left arm access  Filed Weights   12/29/22 0432 12/30/22 1122 12/30/22 1459  Weight: 70.5 kg 69.6 kg 67.2 kg     Intake/Output Summary (Last 24 hours) at 12/31/2022 0817 Last data filed at 12/30/2022 1700 Gross per 24 hour  Intake 730 ml  Output 2500 ml  Net -1770 ml    Additional Objective Labs: Basic Metabolic Panel: Recent Labs  Lab 12/29/22 0452 12/30/22 0414 12/31/22 0454  NA 134* 135 131*  K 4.8 4.5 4.5  CL 96* 96* 94*  CO2 23 20* 23  GLUCOSE 136* 130* 129*  BUN 36* 63* 51*  CREATININE 4.89* 7.48* 5.58*  CALCIUM 9.5 9.5 9.4  PHOS 6.6* 8.7* 6.5*   Liver Function Tests: Recent Labs  Lab 12/27/22 1602 12/28/22 0432 12/29/22 0452 12/30/22 0414 12/31/22 0454  AST 30 29  --   --   --   ALT 18 16  --   --   --   ALKPHOS 85 77  --   --   --   BILITOT 0.9 1.0  --   --   --   PROT 7.6 6.9  --   --   --   ALBUMIN 2.4* 2.2* 2.4* 2.4* 2.5*   CBC: Recent Labs  Lab 12/27/22 1442 12/27/22 1453 12/28/22 0432 12/28/22 0436 12/29/22 0452 12/30/22 0414  12/31/22 0454  WBC 5.3  --  3.9*  --  4.1 5.6 7.7  NEUTROABS 3.6  --   --   --  3.7 5.1 6.6  HGB 11.7*   < > 9.3*   < > 11.1* 10.9* 12.1*  HCT 38.9*   < > 31.4*   < > 36.8* 35.8* 39.9  MCV 85.7  --  86.5  --  85.4 84.2 83.5  PLT 120*  --  95*  --  126* 129* 135*   < > = values in this interval not displayed.   Blood Culture    Component Value Date/Time   SDES FLUID 09/16/2022 1617   SPECREQUEST NONE 09/16/2022 1617   CULT  09/16/2022 1617    FEW ESCHERICHIA COLI FEW BACTEROIDES OVATUS MODERATE BACTEROIDES FRAGILIS RARE STREPTOCOCCUS ANGINOSIS BETA LACTAMASE POSITIVE Performed at [facility], [address] [place], [place] [address]    REPTSTATUS 09/21/2022 FINAL 09/16/2022 1617    CBG: Recent Labs  Lab 12/29/22 1146 12/29/22 1617 12/30/22 0748 12/30/22 1609 12/30/22 2126  GLUCAP 165* 122* 120* 95 129*    Medications:  doxycycline (VIBRAMYCIN) IV 100 mg (12/31/22 0319)    amiodarone  200 mg Oral Daily   apixaban  5 mg Oral BID   Chlorhexidine Gluconate Cloth  6 each Topical Q0600    doxercalciferol  4 mcg Intravenous Q T,Th,Sa-HD   insulin aspart  0-6 Units Subcutaneous TID WC   ipratropium-albuterol  3 mL Nebulization Q6H   midodrine  10 mg Oral Q T,Th,Sa-HD   predniSONE  40 mg Oral Q breakfast   sucroferric oxyhydroxide  1,000 mg Oral BID PC

## 2022-12-31 NOTE — Progress Notes (Addendum)
Patient is alert, awake, relax, cooperative. Right heel , unstable wound with thicken , black scaly  skin. Wound care nurse is consulted. Completed CHG bath per shift.

## 2022-12-31 NOTE — Progress Notes (Signed)
PHARMACIST - PHYSICIAN COMMUNICATION DR:   Thedore Mins CONCERNING: Antibiotic IV to Oral Route Change Policy  RECOMMENDATION: This patient is receiving doxycycline by the intravenous route.  Based on criteria approved by the Pharmacy and Therapeutics Committee, the antibiotic(s) is/are being converted to the equivalent oral dose form(s).   DESCRIPTION: These criteria include: Patient being treated for a respiratory tract infection, urinary tract infection, cellulitis or clostridium difficile associated diarrhea if on metronidazole The patient is not neutropenic and does not exhibit a GI malabsorption state The patient is eating (either orally or via tube) and/or has been taking other orally administered medications for a least 24 hours The patient is improving clinically and has a Tmax < 100.5  If you have questions about this conversion, please contact the Pharmacy Department  []   [phone] )  [facility] []   [phone] )  [facility] [x]   [phone] )  [facility] []   [phone] )  [facility] []   [phone] )  [facility]    Loralee Pacas, PharmD, BCPS 12/31/2022 1:24 PM

## 2022-12-31 NOTE — Progress Notes (Signed)
Progress Note    Nathaniel White   OAC:[id number]  DOB: 1960-02-21  DOA: 12/27/2022     4 PCP: Claiborne Rigg, NP  Initial CC: dyspnea  Hospital Course: Nathaniel White is a 62 year old male with PMH PE (08/2021), PAF, ESRD on HD, COPD, chronic hypoxic respiratory failure on 2 L, marked chronic lung disease, cirrhosis who presented with cough and worsening shortness of breath. He was found to be hypoxic and oxygen was increased to 4 L on admission. He was admitted to undergo further workup regarding his dyspnea.  Interval History:   Seen in bed, no headache chest or abdominal pain, still short of breath, no focal weakness.  Assessment and Plan:  Acute on chronic hypoxic respiratory failure due to rhinovirus pneumonia the setting of chronic PE which is adequately treated by Eliquis.  Uses 2 L nasal cannula oxygen at home, currently on 4 L, clinically much improved, encouraged to sit in chair use I-S and flutter valve for pulmonary toiletry, advance activity and titrate down oxygen.  Taper off steroids.  Continue Eliquis for underlying DVT PE.  Still quite short of breath requiring 5 L nasal cannula encouraged to sit in chair use I-S and flutter valve.  Fluid removal via HD.  Continue to monitor.  SpO2: 98 % O2 Flow Rate (L/min): 5 L/min (found on 5L)   End-stage renal disease on hemodialysis (HCC) - on TThSa, last session Thursday -Nephrology on board, HD per schedule  History of pulmonary embolism -Devious MD reviewed with radiology.  PE noted in the right lower lobe is similar in appearance from PE noted August 2023 with no changes.  Suspect this is chronic PE that has not recannulized the vessel; which is normal progression continue Eliquis.  COPD with acute exacerbation (HCC) - Suspected exacerbation from rhinovirus - Continue breathing treatments and steroids, start tapering steroids no wheezing now.  Chronic hypoxic respiratory failure (HCC) - On 2 L chronic oxygen at  home - Increased to 4 L on admission in setting of COPD exacerbation - Wean back to home setting as able  Paroxysmal atrial fibrillation (HCC) - Continue amiodarone and Eliquis  Elevated troponin - Chronically elevated and at baseline in setting of underlying ESRD -No chest pain or EKG changes noted  Chronic systolic CHF (congestive heart failure) (HCC) - No signs or symptoms of exacerbation - Last echo from 06/19/2022: EF 25 to 30%, no RWMA, indeterminate diastolic function; moderate to severe MR  Hypokalemia - Replete as needed  DM2 (diabetes mellitus, type 2) (HCC) - Continue SSI and CBG monitoring  CBG (last 3)  Recent Labs    12/30/22 1609 12/30/22 2126 12/31/22 0825  GLUCAP 95 129* 114*     Antimicrobials: Doxy 12/28/2022 >> current  DVT prophylaxis:   apixaban (ELIQUIS) tablet 5 mg   Code Status:   Code Status: Full Code  Mobility Assessment (Last 72 Hours)     Mobility Assessment     Row Name 12/31/22 1000 12/31/22 0400 12/31/22 0000 12/30/22 1930 12/30/22 0810   Does patient have an order for bedrest or is patient medically unstable No - Continue assessment No - Continue assessment  left knee small ulcer healing No - Continue assessment  left knee small ulcer healing No - Continue assessment  left knee small ulcer healing No - Continue assessment   What is the highest level of mobility based on the progressive mobility assessment? Level 2 (Chairfast) - Balance while sitting on edge of bed and cannot stand Level  2 (Chairfast) - Balance while sitting on edge of bed and cannot stand Level 2 (Chairfast) - Balance while sitting on edge of bed and cannot stand Level 2 (Chairfast) - Balance while sitting on edge of bed and cannot stand Level 2 (Chairfast) - Balance while sitting on edge of bed and cannot stand   Is the above level different from baseline mobility prior to current illness? Yes - Recommend PT order Yes - Recommend PT order Yes - Recommend PT order Yes -  Recommend PT order --    Row Name 12/30/22 0100 12/30/22 0000 12/29/22 2000 12/29/22 0852 12/29/22 0400   Does patient have an order for bedrest or is patient medically unstable No - Continue assessment No - Continue assessment No - Continue assessment No - Continue assessment No - Continue assessment   What is the highest level of mobility based on the progressive mobility assessment? -- Level 2 (Chairfast) - Balance while sitting on edge of bed and cannot stand Level 2 (Chairfast) - Balance while sitting on edge of bed and cannot stand Level 2 (Chairfast) - Balance while sitting on edge of bed and cannot stand Level 2 (Chairfast) - Balance while sitting on edge of bed and cannot stand   Is the above level different from baseline mobility prior to current illness? Yes - Recommend PT order Yes - Recommend PT order Yes - Recommend PT order -- Yes - Recommend PT order    Row Name 12/29/22 0000 12/28/22 2300 12/28/22 2100       Does patient have an order for bedrest or is patient medically unstable No - Continue assessment No - Continue assessment No - Continue assessment     What is the highest level of mobility based on the progressive mobility assessment? Level 2 (Chairfast) - Balance while sitting on edge of bed and cannot stand -- Level 2 (Chairfast) - Balance while sitting on edge of bed and cannot stand     Is the above level different from baseline mobility prior to current illness? Yes - Recommend PT order -- Yes - Recommend PT order              Barriers to discharge: none Disposition Plan:  Home Status is: Inpt  Objective: Blood pressure 118/76, pulse 73, temperature 98.9 F (37.2 C), resp. rate 16, height 5\' 9"  (1.753 m), weight 67.2 kg, SpO2 98%.   Exam  Awake Alert, No new F.N deficits, Normal affect [place].AT,PERRAL Supple Neck, No JVD,   Symmetrical Chest wall movement, Good air movement bilaterally, ++ rales RRR,No Gallops, Rubs or new Murmurs,  +ve B.Sounds, Abd Soft, No  tenderness,   No Cyanosis, Clubbing or edema    Consultants:  Nephrology  Procedures:    Data Reviewed:  Recent Labs  Lab 12/27/22 1442 12/27/22 1453 12/28/22 0432 12/28/22 0436 12/29/22 0452 12/30/22 0414 12/31/22 0454  WBC 5.3  --  3.9*  --  4.1 5.6 7.7  HGB 11.7*   < > 9.3* 11.2* 11.1* 10.9* 12.1*  HCT 38.9*   < > 31.4* 33.0* 36.8* 35.8* 39.9  PLT 120*  --  95*  --  126* 129* 135*  MCV 85.7  --  86.5  --  85.4 84.2 83.5  MCH 25.8*  --  25.6*  --  25.8* 25.6* 25.3*  MCHC 30.1  --  29.6*  --  30.2 30.4 30.3  RDW 16.9*  --  16.8*  --  16.5* 16.5* 16.4*  LYMPHSABS 0.7  --   --   --  0.2* 0.2* 0.4*  MONOABS 0.7  --   --   --  0.2 0.3 0.7  EOSABS 0.3  --   --   --  0.0 0.0 0.0  BASOSABS 0.0  --   --   --  0.0 0.0 0.0   < > = values in this interval not displayed.    Recent Labs  Lab 12/27/22 1442 12/27/22 1443 12/27/22 1453 12/27/22 1602 12/27/22 2344 12/28/22 0432 12/28/22 0436 12/28/22 0908 12/29/22 0452 12/30/22 0414 12/31/22 0454  NA 135  --  138  --   --  128* 130*  --  134* 135 131*  K 3.1*  --  3.2*  --   --  3.9 3.9  --  4.8 4.5 4.5  CL 95*  --  95*  --   --  88*  --   --  96* 96* 94*  CO2 27  --   --   --   --  22  --   --  23 20* 23  ANIONGAP 13  --   --   --   --  18*  --   --  15 19* 14  GLUCOSE 84  --  85  --   --  366*  --   --  136* 130* 129*  BUN 20  --  23*  --   --  26*  --   --  36* 63* 51*  CREATININE 4.27*  --  4.40*  --   --  5.37*  --   --  4.89* 7.48* 5.58*  AST  --   --   --  30  --  29  --   --   --   --   --   ALT  --   --   --  18  --  16  --   --   --   --   --   ALKPHOS  --   --   --  85  --  77  --   --   --   --   --   BILITOT  --   --   --  0.9  --  1.0  --   --   --   --   --   ALBUMIN  --   --   --  2.4*  --  2.2*  --   --  2.4* 2.4* 2.5*  PROCALCITON  --   --   --   --   --   --   --  1.42  --   --   --   BNP  --  2,332.5*  --   --   --   --   --   --   --   --   --   MG  --   --   --   --  1.8 1.8  --   --  2.0 2.0 1.9   CALCIUM 8.7*  --   --   --   --  8.1*  --   --  9.5 9.5 9.4     Recent Labs  Lab 12/27/22 1442 12/27/22 1443 12/27/22 2344 12/28/22 0432 12/28/22 0908 12/29/22 0452 12/30/22 0414 12/31/22 0454  PROCALCITON  --   --   --   --  1.42  --   --   --   BNP  --  2,332.5*  --   --   --   --   --   --  MG  --   --  1.8 1.8  --  2.0 2.0 1.9  CALCIUM 8.7*  --   --  8.1*  --  9.5 9.5 9.4      LOS: 4 days   Signature  -    Susa Raring M.D on 12/31/2022 at 10:28 AM   -  To page go to [email or web address]

## 2022-12-31 NOTE — Plan of Care (Signed)

## 2022-12-31 NOTE — Consult Note (Signed)
WOC Nurse Consult Note: Reason for Consult: Requested to assess a unstageable wound on the R heel. Wound type: Callus on R heel. Pressure Injury POA: Yes Measurement: 2x2cm, cover by a crust with 4x3cm. Pt has DM. Wound bed: Callus part removed with conservative sharp debridement.Pt felt pain during the procedure and I was not able to debride all.  Drainage (amount, consistency, odor) no odor, no drainage. Periwound: cover by crust, par removed expose a intact skin. Dressing procedure/placement/frequency: Apply daily moisture cream, cover with foam dressing, change 3Q. Use Prevalon Boot to relieve the pressure. Obs: Pt said that he is able to walk.  WOC team will not plan to follow further.  Please reconsult if further assistance is needed. Thank-you,  Denyse Amass BSN, RN, [employer], WOC  (Pager: [phone])

## 2022-12-31 NOTE — Progress Notes (Signed)
Heart Failure Navigator Progress Note  Assessed for Heart & Vascular TOC clinic readiness.  Patient does not meet criteria due to ESRD on hemodialysis.   Navigator will sign off at this time.    Dawn Fields, BSN, RN Heart Failure Nurse Navigator Secure Chat Only   

## 2022-12-31 NOTE — Progress Notes (Signed)
Pt receives out-pt HD at [facility]. Will assist as needed.   Olivia Canter Renal Navigator [phone]

## 2023-01-01 ENCOUNTER — Other Ambulatory Visit (HOSPITAL_COMMUNITY): Payer: Self-pay

## 2023-01-01 DIAGNOSIS — Z86711 Personal history of pulmonary embolism: Secondary | ICD-10-CM | POA: Diagnosis not present

## 2023-01-01 LAB — RENAL FUNCTION PANEL
Albumin: 2.6 g/dL — ABNORMAL LOW (ref 3.5–5.0)
Anion gap: 13 (ref 5–15)
BUN: 42 mg/dL — ABNORMAL HIGH (ref 6–20)
CO2: 26 mmol/L (ref 22–32)
Calcium: 9.7 mg/dL (ref 8.9–10.3)
Chloride: 94 mmol/L — ABNORMAL LOW (ref 98–111)
Creatinine, Ser: 5.43 mg/dL — ABNORMAL HIGH (ref 0.61–1.24)
GFR, Estimated: 11 mL/min — ABNORMAL LOW (ref >60)
Glucose, Bld: 104 mg/dL — ABNORMAL HIGH (ref 70–99)
Phosphorus: 5 mg/dL — ABNORMAL HIGH (ref 2.5–4.6)
Potassium: 4.1 mmol/L (ref 3.5–5.1)
Sodium: 133 mmol/L — ABNORMAL LOW (ref 135–145)

## 2023-01-01 LAB — CBC WITH DIFFERENTIAL/PLATELET
Abs Immature Granulocytes: 0.05 10*3/uL (ref 0.00–0.07)
Basophils Absolute: 0 10*3/uL (ref 0.0–0.1)
Basophils Relative: 0 %
Eosinophils Absolute: 0 10*3/uL (ref 0.0–0.5)
Eosinophils Relative: 0 %
HCT: 44.2 % (ref 39.0–52.0)
Hemoglobin: 13.5 g/dL (ref 13.0–17.0)
Immature Granulocytes: 1 %
Lymphocytes Relative: 8 %
Lymphs Abs: 0.6 10*3/uL — ABNORMAL LOW (ref 0.7–4.0)
MCH: 25.5 pg — ABNORMAL LOW (ref 26.0–34.0)
MCHC: 30.5 g/dL (ref 30.0–36.0)
MCV: 83.4 fL (ref 80.0–100.0)
Monocytes Absolute: 1.1 10*3/uL — ABNORMAL HIGH (ref 0.1–1.0)
Monocytes Relative: 13 %
Neutro Abs: 6.5 10*3/uL (ref 1.7–7.7)
Neutrophils Relative %: 78 %
Platelets: 103 10*3/uL — ABNORMAL LOW (ref 150–400)
RBC: 5.3 MIL/uL (ref 4.22–5.81)
RDW: 16.7 % — ABNORMAL HIGH (ref 11.5–15.5)
WBC: 8.3 10*3/uL (ref 4.0–10.5)
nRBC: 0 % (ref 0.0–0.2)

## 2023-01-01 LAB — GLUCOSE, CAPILLARY
Glucose-Capillary: 112 mg/dL — ABNORMAL HIGH (ref 70–99)
Glucose-Capillary: 68 mg/dL — ABNORMAL LOW (ref 70–99)
Glucose-Capillary: 96 mg/dL (ref 70–99)

## 2023-01-01 LAB — CBC
HCT: 40.6 % (ref 39.0–52.0)
Hemoglobin: 12.3 g/dL — ABNORMAL LOW (ref 13.0–17.0)
MCH: 25.6 pg — ABNORMAL LOW (ref 26.0–34.0)
MCHC: 30.3 g/dL (ref 30.0–36.0)
MCV: 84.6 fL (ref 80.0–100.0)
Platelets: 96 10*3/uL — ABNORMAL LOW (ref 150–400)
RBC: 4.8 MIL/uL (ref 4.22–5.81)
RDW: 16.4 % — ABNORMAL HIGH (ref 11.5–15.5)
WBC: 5.5 10*3/uL (ref 4.0–10.5)
nRBC: 0 % (ref 0.0–0.2)

## 2023-01-01 LAB — MAGNESIUM: Magnesium: 2.1 mg/dL (ref 1.7–2.4)

## 2023-01-01 MED ORDER — DOXYCYCLINE HYCLATE 100 MG PO TABS
100.0000 mg | ORAL_TABLET | Freq: Two times a day (BID) | ORAL | 0 refills | Status: AC
  Filled 2023-01-01: qty 4, 2d supply, fill #0

## 2023-01-01 MED ORDER — DOXYCYCLINE HYCLATE 100 MG PO TABS: 100.0000 mg | ORAL_TABLET | Freq: Two times a day (BID) | ORAL | Status: DC

## 2023-01-01 MED ORDER — PREDNISONE 20 MG PO TABS
20.0000 mg | ORAL_TABLET | Freq: Every day | ORAL | Status: DC
Start: 2023-01-02 — End: 2023-01-01

## 2023-01-01 NOTE — Discharge Planning (Signed)
[facility] Patient Discharge Orders- [facility] CLINIC: [facility]  Patient's name: ALESSANDER TA Admit/DC Dates: 12/27/2022 - 01/01/2023  Discharge Diagnoses: Acute on chronic hypoxic respiratory failure due to rhinovirus pneumonia     Aranesp: Given: NO  Date and amount of last dose: NA  Last Hgb: 13.5 PRBC's Given: No Date/# of units: NA ESA dose for discharge: mircera O mcg IV q 2 weeks per protocol IV Iron dose at discharge: Per Protocol  Heparin change: NA  EDW Change: Yes New EDW: 67.5 kg  Bath Change: No  Access intervention/Change: No Details:  Hectorol/Calcitriol change: Yes decrease to 2 mcg IV three times per week Corrected Calcium 10.8  Discharge Labs: Calcium 9.7 Phosphorus 5.0 Albumin 2.7 K+ 4.1  IV Antibiotics: No Details:  On Coumadin?: No Last INR: Next INR: Managed By:   OTHER/APPTS/LAB ORDERS: Patient is DNR AT OP CLINIC BUT FULL CODE HERE. WE NEED TO SORT THIS OUT AND FIND OUT WHICH IS ACCURATE  Xphozah 30 MG Tabs  DC'd while in hospital-no ideal why! Please ask Dietician to assess and resume if needed please      D/C Meds to be reconciled by nurse after every discharge.  Completed By: Alonna Buckler [facility] [phone]    Reviewed by: MD:______ RN_______

## 2023-01-01 NOTE — Discharge Summary (Signed)
Nathaniel White:[id number] DOB: 1960/03/29 DOA: 12/27/2022  PCP: Claiborne Rigg, NP  Admit date: 12/27/2022  Discharge date: 01/01/2023  Admitted From: Home   Disposition:  Home   Recommendations for Outpatient Follow-up:   Follow up with PCP in 1-2 weeks  PCP Please obtain BMP/CBC, 2 view CXR in 1week,  (see Discharge instructions)   PCP Please follow up on the following pending results:    Home Health: None   Equipment/Devices: None  Consultations: Renal Discharge Condition: Stable     CODE STATUS: Full     Diet Recommendation: Renal Diet with 1.5 L fluid restriction    Chief Complaint  Patient presents with   Shortness of Breath     Brief history of present illness from the day of admission and additional interim summary     62 year old male with PMH PE (08/2021), PAF, ESRD on HD, COPD, chronic hypoxic respiratory failure on 2 L, marked chronic lung disease, cirrhosis who presented with cough and worsening shortness of breath. He was found to be hypoxic and oxygen was increased to 4 L on admission. He was admitted to undergo further workup regarding his dyspnea.                                                                    Hospital Course   Acute on chronic hypoxic respiratory failure due to rhinovirus pneumonia the setting of chronic PE which is adequately treated by Eliquis.   Uses 2 L nasal cannula oxygen at home, clinically much improved, encouraged to sit in chair use I-S and flutter valve for pulmonary toiletry, advance activity and titrate down oxygen.  Tapered off steroids.  Continue Eliquis for underlying DVT PE.  Acute Resp failure has resolved.  Is back to his baseline and eager to be discharged back.     End-stage renal disease on hemodialysis (HCC) - on TThSa, last session  Thursday -Nephrology on board, HD per schedule as per done   History of pulmonary embolism -Devious MD reviewed with radiology.  PE noted in the right lower lobe is similar in appearance from PE noted August 2023 with no changes.  Suspect this is chronic PE that has not recannulized the vessel; which is normal progression continue Eliquis.   COPD with acute exacerbation (HCC) - Suspected exacerbation from rhinovirus - Continue breathing treatments and steroids, steroids tapered off.   Chronic hypoxic respiratory failure (HCC) - On 2 L chronic oxygen at home - at baseline now   Paroxysmal atrial fibrillation (HCC) - Continue amiodarone and Eliquis   Elevated troponin - Chronically elevated and at baseline in setting of underlying ESRD -No chest pain or EKG changes noted   Chronic systolic CHF (congestive heart failure) (HCC) - No signs or symptoms of exacerbation - Last  echo from 06/19/2022: EF 25 to 30%, no RWMA, indeterminate diastolic function; moderate to severe MR     DM2 (diabetes mellitus, type 2) (HCC) diet controlled  Lab Results  Component Value Date   HGBA1C 4.8 11/29/2022       Discharge diagnosis     Principal Problem:   History of pulmonary embolism Active Problems:   Acute bronchitis due to Rhinovirus   COPD with acute exacerbation (HCC)   Chronic hypoxic respiratory failure (HCC)   Paroxysmal atrial fibrillation (HCC)   End-stage renal disease on hemodialysis (HCC)   Elevated troponin   Chronic systolic CHF (congestive heart failure) (HCC)   DM2 (diabetes mellitus, type 2) (HCC)   Hypokalemia   Prolonged QT interval    Discharge instructions    Discharge Instructions     Discharge instructions   Complete by: As directed    Follow with Primary MD Claiborne Rigg, NP in 7 days   Get CBC, CMP, 2 view Chest X ray -  checked next visit with your primary MD    Activity: As tolerated with Full fall precautions use walker/cane & assistance as  needed  Disposition Home    Diet: Renal diet with 1.5 L fluid restriction  Special Instructions: If you have smoked or chewed Tobacco  in the last 2 yrs please stop smoking, stop any regular Alcohol  and or any Recreational drug use.  On your next visit with your primary care physician please Get Medicines reviewed and adjusted.  Please request your Prim.MD to go over all Hospital Tests and Procedure/Radiological results at the follow up, please get all Hospital records sent to your Prim MD by signing hospital release before you go home.  If you experience worsening of your admission symptoms, develop shortness of breath, life threatening emergency, suicidal or homicidal thoughts you must seek medical attention immediately by calling 911 or calling your MD immediately  if symptoms less severe.  You Must read complete instructions/literature along with all the possible adverse reactions/side effects for all the Medicines you take and that have been prescribed to you. Take any new Medicines after you have completely understood and accpet all the possible adverse reactions/side effects.   Do not drive when taking Pain medications.  Do not take more than prescribed Pain, Sleep and Anxiety Medications   Discharge wound care:   Complete by: As directed    Buttocks Right Stage 2 -  Partial thickness loss of dermis presenting as a shallow open injury with a red, pink wound bed without slough. skin tear around rectum, inside right butt check - Apply moisture cream (apply daily), cover with foam dressing, change 3Q. Use Prevalon Boot to relieve the pressure.   Increase activity slowly   Complete by: As directed        Discharge Medications   Allergies as of 01/01/2023       Reactions   Codeine Shortness Of Breath   Dextromethorphan-guaifenesin Shortness Of Breath, Other (See Comments)   Iron Dextran Shortness Of Breath, Other (See Comments)   Morphine And Codeine Shortness Of Breath   Other  Reaction(s): Other (See Comments)        Medication List     STOP taking these medications    mometasone-formoterol 200-5 MCG/ACT Aero Commonly known as: DULERA   Xphozah 30 MG Tabs Generic drug: Tenapanor HCl (CKD)       TAKE these medications    acetaminophen 500 MG tablet Commonly known as: TYLENOL Take 1 tablet (  500 mg total) by mouth every 6 (six) hours as needed. What changed: reasons to take this   albuterol 108 (90 Base) MCG/ACT inhaler Commonly known as: ProAir HFA Inhale 2 puffs into the lungs every 6 (six) hours as needed for wheezing or shortness of breath. What changed: Another medication with the same name was removed. Continue taking this medication, and follow the directions you see here.   amiodarone 200 MG tablet Commonly known as: PACERONE Take 1 tablet (200 mg total) by mouth daily.   doxycycline 100 MG tablet Commonly known as: VIBRA-TABS Take 1 tablet (100 mg total) by mouth every 12 (twelve) hours for 2 days.   Eliquis 5 MG Tabs tablet Generic drug: apixaban Take 5 mg by mouth 2 (two) times daily.   midodrine 10 MG tablet Commonly known as: PROAMATINE Take 1 tablet (10 mg total) by mouth 3 (three) times daily with meals. What changed:  when to take this additional instructions   polyethylene glycol 17 g packet Commonly known as: MiraLax Take 17 g by mouth daily as needed for moderate constipation or mild constipation. Also available over-the-counter   senna-docusate 8.6-50 MG tablet Commonly known as: Senokot-S Take 2 tablets by mouth at bedtime. For constipation What changed:  when to take this reasons to take this   traZODone 50 MG tablet Commonly known as: DESYREL Take 50 mg by mouth at bedtime as needed for sleep.   Velphoro 500 MG chewable tablet Generic drug: sucroferric oxyhydroxide Chew 1,000 mg by mouth 2 (two) times daily after a meal.   Visine 0.05 % ophthalmic solution Generic drug: tetrahydrozoline Place 2-3  drops into both eyes 2 (two) times daily as needed (dry eye).               Discharge Care Instructions  (From admission, onward)           Start     Ordered   01/01/23 0000  Discharge wound care:       Comments: Buttocks Right Stage 2 -  Partial thickness loss of dermis presenting as a shallow open injury with a red, pink wound bed without slough. skin tear around rectum, inside right butt check - Apply moisture cream (apply daily), cover with foam dressing, change 3Q. Use Prevalon Boot to relieve the pressure.   01/01/23 0914             Follow-up Information     Claiborne Rigg, NP. Schedule an appointment as soon as possible for a visit in 1 week(s).   Specialty: Nurse Practitioner Contact information: [address] [address] [place] [address] [phone]                 Major procedures and Radiology Reports - PLEASE review detailed and final reports thoroughly  -        VAS [place] LOWER EXTREMITY VENOUS (DVT) Result Date: 12/29/2022  Lower Venous DVT Study Patient Name:  EMMERT NICHTER  Date of Exam:   12/28/2022 Medical Rec #: [id number]        Accession #:    [id number] Date of Birth: November 15, 1960        Patient Gender: M Patient Age:   62 years Exam Location:  [facility] Procedure:      VAS [place] LOWER EXTREMITY VENOUS (DVT) Referring Phys: Newton Pigg --------------------------------------------------------------------------------  Indications: Hx of pulmonary embolism.  Risk Factors: Hx of PE. Anticoagulation: Eliquis. Limitations: Patient clothing. Comparison Study: No significant changes seen  since prior exam 09/05/21 Performing Technologist: Shona Simpson  Examination Guidelines: A complete evaluation includes B-mode imaging, spectral Doppler, color Doppler, and power Doppler as needed of all accessible portions of each vessel. Bilateral testing is considered an integral part of a complete examination. Limited examinations for  reoccurring indications may be performed as noted. The reflux portion of the exam is performed with the patient in reverse Trendelenburg.  +---------+---------------+---------+-----------+----------+-------------------+ RIGHT    CompressibilityPhasicitySpontaneityPropertiesThrombus Aging      +---------+---------------+---------+-----------+----------+-------------------+ CFV      Full           Yes      Yes                                      +---------+---------------+---------+-----------+----------+-------------------+ SFJ                                                   Not well visualized +---------+---------------+---------+-----------+----------+-------------------+ FV Prox  Full                                                             +---------+---------------+---------+-----------+----------+-------------------+ FV Mid   Full                                                             +---------+---------------+---------+-----------+----------+-------------------+ FV DistalFull                                                             +---------+---------------+---------+-----------+----------+-------------------+ PFV      Full                                                             +---------+---------------+---------+-----------+----------+-------------------+ POP      Full           Yes      Yes                                      +---------+---------------+---------+-----------+----------+-------------------+ PTV      Full                                                             +---------+---------------+---------+-----------+----------+-------------------+ PERO     Full                                                             +---------+---------------+---------+-----------+----------+-------------------+   +---------+---------------+---------+-----------+----------+--------------+  LEFT      CompressibilityPhasicitySpontaneityPropertiesThrombus Aging +---------+---------------+---------+-----------+----------+--------------+ CFV      Full           Yes      Yes                                 +---------+---------------+---------+-----------+----------+--------------+ SFJ      Full                                                        +---------+---------------+---------+-----------+----------+--------------+ FV Prox  Full                                                        +---------+---------------+---------+-----------+----------+--------------+ FV Mid   Full                                                        +---------+---------------+---------+-----------+----------+--------------+ FV DistalFull                                                        +---------+---------------+---------+-----------+----------+--------------+ PFV      Full                                                        +---------+---------------+---------+-----------+----------+--------------+ POP      Full           Yes      Yes                                 +---------+---------------+---------+-----------+----------+--------------+ PTV      Full                                                        +---------+---------------+---------+-----------+----------+--------------+ PERO     Full                                                        +---------+---------------+---------+-----------+----------+--------------+     Summary: BILATERAL: - No evidence of deep vein thrombosis seen in the lower extremities, bilaterally. -No evidence of popliteal cyst, bilaterally.   *See table(s) above for measurements and observations. Electronically signed by Coral Else MD on 12/29/2022 at 10:03:02 AM.  Final    ECHOCARDIOGRAM COMPLETE Result Date: 12/28/2022    ECHOCARDIOGRAM REPORT   Patient Name:   KJUAN BLOCHER Date of Exam: 12/28/2022 Medical Rec  #:  [id number]       Height:       69.0 in Accession #:    [id number]      Weight:       170.0 lb Date of Birth:  [birth year]       BSA:          1.928 m Patient Age:    62 years        BP:           154/83 mmHg Patient Gender: M               HR:           66 bpm. Exam Location:  Inpatient Procedure: 2D Echo, Cardiac Doppler, Color Doppler and Intracardiac            Opacification Agent Indications:    pulmonary embolism  History:        Patient has prior history of Echocardiogram examinations, most                 recent 09/05/2021. CHF, COPD and Pulmonary HTN, Arrythmias:Atrial                 Fibrillation; Signs/Symptoms:Shortness of Breath.  Sonographer:    Webb Laws Referring Phys: [id number] JUSTIN B HOWERTER IMPRESSIONS  1. Left ventricular ejection fraction, by estimation, is 35 to 40%. The left ventricle has moderately decreased function. The left ventricle demonstrates global hypokinesis. The left ventricular internal cavity size was mildly dilated. There is mild left ventricular hypertrophy. Left ventricular diastolic parameters are indeterminate.  2. Right ventricular systolic function is moderately reduced. The right ventricular size is mildly enlarged. There is severely elevated pulmonary artery systolic pressure. The estimated right ventricular systolic pressure is 81.6 mmHg.  3. Left atrial size was severely dilated.  4. Right atrial size was mildly dilated.  5. The mitral valve is degenerative. Severe mitral valve regurgitation. Mild mitral stenosis. The mean mitral valve gradient is 4.5 mmHg with average heart rate of 63 bpm. Severe mitral annular calcification.  6. The aortic valve was not well visualized. There is mild calcification of the aortic valve. Aortic valve regurgitation is not visualized. No aortic stenosis is present.  7. The inferior vena cava is dilated in size with <50% respiratory variability, suggesting right atrial pressure of 15 mmHg. FINDINGS  Left Ventricle: Left ventricular  ejection fraction, by estimation, is 35 to 40%. The left ventricle has moderately decreased function. The left ventricle demonstrates global hypokinesis. The left ventricular internal cavity size was mildly dilated. There is mild left ventricular hypertrophy. Left ventricular diastolic parameters are indeterminate. Right Ventricle: The right ventricular size is mildly enlarged. No increase in right ventricular wall thickness. Right ventricular systolic function is moderately reduced. There is severely elevated pulmonary artery systolic pressure. The tricuspid regurgitant velocity is 4.08 m/s, and with an assumed right atrial pressure of 15 mmHg, the estimated right ventricular systolic pressure is 81.6 mmHg. Left Atrium: Left atrial size was severely dilated. Right Atrium: Right atrial size was mildly dilated. Pericardium: There is no evidence of pericardial effusion. Mitral Valve: The mitral valve is degenerative in appearance. There is severe thickening of the mitral valve leaflet(s). There is severe calcification of the mitral valve leaflet(s). Severe mitral annular calcification. Severe mitral valve regurgitation.  Mild mitral  valve stenosis. The mean mitral valve gradient is 4.5 mmHg with average heart rate of 63 bpm. Tricuspid Valve: The tricuspid valve is normal in structure. Tricuspid valve regurgitation is mild . No evidence of tricuspid stenosis. Aortic Valve: The aortic valve was not well visualized. There is mild calcification of the aortic valve. Aortic valve regurgitation is not visualized. No aortic stenosis is present. Pulmonic Valve: The pulmonic valve was not well visualized. Pulmonic valve regurgitation is trivial. No evidence of pulmonic stenosis. Aorta: The aortic root is normal in size and structure. Venous: A pattern of systolic flow reversal, suggestive of severe mitral regurgitation is recorded from the right lower pulmonary vein. The inferior vena cava is dilated in size with less than 50%  respiratory variability, suggesting right atrial pressure  of 15 mmHg. IAS/Shunts: The atrial septum is grossly normal.  LEFT VENTRICLE PLAX 2D LVIDd:         5.90 cm   Diastology LVIDs:         3.40 cm   LV e' medial:    3.48 cm/s LV PW:         1.10 cm   LV E/e' medial:  42.2 LV IVS:        1.20 cm   LV e' lateral:   5.33 cm/s LVOT diam:     2.20 cm   LV E/e' lateral: 27.6 LV SV:         56 LV SV Index:   29 LVOT Area:     3.80 cm  RIGHT VENTRICLE            IVC RV Basal diam:  3.90 cm    IVC diam: 2.20 cm RV S prime:     5.00 cm/s TAPSE (M-mode): 1.2 cm LEFT ATRIUM              Index        RIGHT ATRIUM           Index LA diam:        5.30 cm  2.75 cm/m   RA Area:     17.10 cm LA Vol (A2C):   95.0 ml  49.27 ml/m  RA Volume:   40.40 ml  20.95 ml/m LA Vol (A4C):   104.0 ml 53.94 ml/m LA Biplane Vol: 105.0 ml 54.46 ml/m  AORTIC VALVE LVOT Vmax:   87.70 cm/s LVOT Vmean:  53.200 cm/s LVOT VTI:    0.147 m  AORTA Ao Root diam: 2.80 cm Ao Asc diam:  3.50 cm MITRAL VALVE                  TRICUSPID VALVE MV Area (PHT): 3.79 cm       TR Peak grad:   66.6 mmHg MV Mean grad:  4.5 mmHg       TR Vmax:        408.00 cm/s MR Peak grad:    121.9 mmHg MR Mean grad:    71.0 mmHg    SHUNTS MR Vmax:         552.00 cm/s  Systemic VTI:  0.15 m MR Vmean:        384.0 cm/s   Systemic Diam: 2.20 cm MR PISA:         4.02 cm MR PISA Eff ROA: 26 mm MR PISA Radius:  0.80 cm MV E velocity: 147.00 cm/s MV A velocity: 99.80 cm/s MV E/A ratio:  1.47 Weston Brass MD Electronically signed by Weston Brass MD Signature Date/Time:  12/28/2022/4:30:23 PM    Final    CT Angio Chest PE W and/or Wo Contrast Result Date: 12/27/2022 CLINICAL DATA:  Shortness of breath, worsening after antibiotics, cough EXAM: CT ANGIOGRAPHY CHEST WITH CONTRAST TECHNIQUE: Multidetector CT imaging of the chest was performed using the standard protocol during bolus administration of intravenous contrast. Multiplanar CT image reconstructions and MIPs were  obtained to evaluate the vascular anatomy. RADIATION DOSE REDUCTION: This exam was performed according to the departmental dose-optimization program which includes automated exposure control, adjustment of the mA and/or kV according to patient size and/or use of iterative reconstruction technique. CONTRAST:  75mL OMNIPAQUE IOHEXOL 350 MG/ML SOLN COMPARISON:  Same day chest radiograph and CTA chest 09/04/2021 FINDINGS: Cardiovascular: Filling defect in a posterior right lower lobe segmental artery (series 6/image 153). This is favored to represent a acute pulmonary embolism there is extensive mixing artifact in the right lower lobe pulmonary arteries. Mild cardiomegaly. Coronary artery and aortic atherosclerotic calcification. No evidence of right heart strain. No pericardial effusion. Reflux of contrast into the IVC and hepatic veins compatible with elevated right heart pressures. Dilated main pulmonary artery measuring 34 mm. Mediastinum/Nodes: Trachea and esophagus are unremarkable. Similar mediastinal adenopathy. For example an unchanged 1.2 cm right pretracheal node (6/87). Lungs/Pleura: Interlobular septal thickening and patchy ground-glass opacities with more confluence airspace opacities in the lower lobes. This is similar to 09/04/2021. Right fibrothorax and trace effusion. Small left effusion in the apex. Upper Abdomen: Nodular hepatic contour compatible with cirrhosis. Cholelithiasis. Polycystic kidneys. Musculoskeletal: No acute fracture. Review of the MIP images confirms the above findings. IMPRESSION: 1. Acute nonocclusive pulmonary embolism in a posterior right lower lobe segmental artery. Alternatively this could represent mixing artifact. No evidence of right heart strain. 2. Chronic lung disease similar to 09/04/2021. 3. Marked cardiomegaly and elevated right heart pressures. 4. Dilated main pulmonary artery can be seen with pulmonary arterial hypertension 5. Cirrhosis. Aortic Atherosclerosis  (ICD10-I70.0). Electronically Signed   By: Minerva Fester M.D.   On: 12/27/2022 22:41   DG Chest 2 View Result Date: 12/27/2022 CLINICAL DATA:  Shortness of breath, worsening after antibiotics. Cough EXAM: CHEST - 2 VIEW COMPARISON:  11/29/2022 FINDINGS: Stable cardiomegaly.  Aortic atherosclerotic calcification. Left IJ CVC tip in the right atrium. Bilateral airspace and interstitial opacities slightly improved from 11/29/2022. Small bilateral pleural effusions. No pneumothorax. IMPRESSION: Bilateral airspace and interstitial opacities may be due to edema or infection. Small pleural effusions. Electronically Signed   By: Minerva Fester M.D.   On: 12/27/2022 19:39    Micro Results     Recent Results (from the past 240 hours)  SARS Coronavirus 2 by RT PCR (hospital order, performed in [facility] hospital lab) *cepheid single result test* Anterior Nasal Swab     Status: None   Collection Time: 12/28/22  8:12 AM   Specimen: Anterior Nasal Swab  Result Value Ref Range Status   SARS Coronavirus 2 by RT PCR NEGATIVE NEGATIVE Final    Comment: Performed at [facility], [address], [place], [place] [address]  Respiratory (~20 pathogens) panel by PCR     Status: Abnormal   Collection Time: 12/28/22  8:13 AM   Specimen: Anterior Nasal Swab; Respiratory  Result Value Ref Range Status   Adenovirus NOT DETECTED NOT DETECTED Final   Coronavirus 229E NOT DETECTED NOT DETECTED Final    Comment: (NOTE) The Coronavirus on the Respiratory Panel, DOES NOT test for the novel  Coronavirus (2019 nCoV)    Coronavirus  HKU1 NOT DETECTED NOT DETECTED Final   Coronavirus NL63 NOT DETECTED NOT DETECTED Final   Coronavirus OC43 NOT DETECTED NOT DETECTED Final   Metapneumovirus NOT DETECTED NOT DETECTED Final   Rhinovirus / Enterovirus DETECTED (A) NOT DETECTED Final   Influenza A NOT DETECTED NOT DETECTED Final   Influenza B NOT DETECTED NOT DETECTED Final   Parainfluenza Virus 1 NOT DETECTED NOT  DETECTED Final   Parainfluenza Virus 2 NOT DETECTED NOT DETECTED Final   Parainfluenza Virus 3 NOT DETECTED NOT DETECTED Final   Parainfluenza Virus 4 NOT DETECTED NOT DETECTED Final   Respiratory Syncytial Virus NOT DETECTED NOT DETECTED Final   Bordetella pertussis NOT DETECTED NOT DETECTED Final   Bordetella Parapertussis NOT DETECTED NOT DETECTED Final   Chlamydophila pneumoniae NOT DETECTED NOT DETECTED Final   Mycoplasma pneumoniae NOT DETECTED NOT DETECTED Final    Comment: Performed at [facility], [address], [place], [place] [address]    Today   Subjective    Branon Petrosino today has no headache,no chest abdominal pain,no new weakness tingling or numbness, feels much better wants to go home today.     Objective   Blood pressure 116/84, pulse 65, temperature 98.2 F (36.8 C), temperature source Oral, resp. rate 18, height 5\' 9"  (1.753 m), weight 67.2 kg, SpO2 98%.  No intake or output data in the 24 hours ending 01/01/23 0914  Exam  Awake Alert, No new F.N deficits,    [place].AT,PERRAL Supple Neck,   Symmetrical Chest wall movement, Good air movement bilaterally, CTAB RRR,No Gallops,   +ve B.Sounds, Abd Soft, Non tender,  No Cyanosis, Clubbing or edema    Data Review   Recent Labs  Lab 12/27/22 1442 12/27/22 1453 12/29/22 0452 12/30/22 0414 12/31/22 0454 12/31/22 2229 01/01/23 0752  WBC 5.3   < > 4.1 5.6 7.7 5.5 8.3  HGB 11.7*   < > 11.1* 10.9* 12.1* 12.3* 13.5  HCT 38.9*   < > 36.8* 35.8* 39.9 40.6 44.2  PLT 120*   < > 126* 129* 135* 96* 103*  MCV 85.7   < > 85.4 84.2 83.5 84.6 83.4  MCH 25.8*   < > 25.8* 25.6* 25.3* 25.6* 25.5*  MCHC 30.1   < > 30.2 30.4 30.3 30.3 30.5  RDW 16.9*   < > 16.5* 16.5* 16.4* 16.4* 16.7*  LYMPHSABS 0.7  --  0.2* 0.2* 0.4*  --  0.6*  MONOABS 0.7  --  0.2 0.3 0.7  --  1.1*  EOSABS 0.3  --  0.0 0.0 0.0  --  0.0  BASOSABS 0.0  --  0.0 0.0 0.0  --  0.0   < > = values in this interval not displayed.    Recent Labs   Lab 12/27/22 1443 12/27/22 1453 12/27/22 1602 12/27/22 2344 12/28/22 0432 12/28/22 0436 12/28/22 0908 12/29/22 0452 12/30/22 0414 12/31/22 0454 12/31/22 2229 01/01/23 0752  NA  --    < >  --   --  128*   < >  --  134* 135 131* 133* 133*  K  --    < >  --   --  3.9   < >  --  4.8 4.5 4.5 4.3 4.1  CL  --    < >  --   --  88*  --   --  96* 96* 94* 94* 94*  CO2  --   --   --   --  22  --   --  23 20* 23 22 26   ANIONGAP  --   --   --   --  18*  --   --  15 19* 14 17* 13  GLUCOSE  --    < >  --   --  366*  --   --  136* 130* 129* 147* 104*  BUN  --    < >  --   --  26*  --   --  36* 63* 51* 74* 42*  CREATININE  --    < >  --   --  5.37*  --   --  4.89* 7.48* 5.58* 7.82* 5.43*  AST  --   --  30  --  29  --   --   --   --   --   --   --   ALT  --   --  18  --  16  --   --   --   --   --   --   --   ALKPHOS  --   --  85  --  77  --   --   --   --   --   --   --   BILITOT  --   --  0.9  --  1.0  --   --   --   --   --   --   --   ALBUMIN  --    < > 2.4*  --  2.2*  --   --  2.4* 2.4* 2.5* 2.4* 2.6*  PROCALCITON  --   --   --   --   --   --  1.42  --   --   --   --   --   BNP 2,332.5*  --   --   --   --   --   --   --   --   --   --   --   MG  --   --   --    < > 1.8  --   --  2.0 2.0 1.9  --  2.1  CALCIUM  --   --   --   --  8.1*  --   --  9.5 9.5 9.4 9.4 9.7   < > = values in this interval not displayed.    Total Time in preparing paper work, data evaluation and todays exam - 35 minutes  Signature  -    Susa Raring M.D on 01/01/2023 at 9:14 AM   -  To page go to [email or web address]

## 2023-01-01 NOTE — TOC Transition Note (Signed)
Transition of Care [facility]) - Discharge Note   Patient Details  Name: ABDULSALAM White MRN: [id number] Date of Birth: 1960-08-17  Transition of Care [facility] CM/SW Contact:  Gordy Clement, RN Phone Number: 01/01/2023, 10:22 AM   Clinical Narrative:     Patient will DC to home today .  [employer] will be bringing portable O2 to room  Family is transporting  No additional TOC needs           Patient Goals and CMS Choice            Discharge Placement                       Discharge Plan and Services Additional resources added to the After Visit Summary for                                       Social Drivers of Health (SDOH) Interventions SDOH Screenings   Food Insecurity: No Food Insecurity (12/29/2022)  Housing: Low Risk  (12/29/2022)  Transportation Needs: No Transportation Needs (12/29/2022)  Utilities: Not At Risk (12/29/2022)  Alcohol Screen: Low Risk  (07/18/2022)  Depression (PHQ2-9): Low Risk  (07/18/2022)  Financial Resource Strain: Low Risk  (07/18/2022)  Physical Activity: Inactive (07/18/2022)  Social Connections: Moderately Isolated (07/18/2022)  Stress: No Stress Concern Present (07/18/2022)  Tobacco Use: Medium Risk (12/28/2022)     Readmission Risk Interventions    09/15/2021   10:09 AM 09/06/2021    4:20 PM 06/23/2021    3:52 PM  Readmission Risk Prevention Plan  Transportation Screening Complete Complete Complete  PCP or Specialist Appt within 3-5 Days Complete Complete Complete  HRI or Home Care Consult Complete Complete Complete  Social Work Consult for Recovery Care Planning/Counseling Complete Complete Complete  Palliative Care Screening Not Applicable Not Applicable Not Applicable  Medication Review Oceanographer) Complete Complete Complete

## 2023-01-01 NOTE — Progress Notes (Signed)
[facility] Progress Note   Dialysis Orders: SW TTS  4h  400/500  68.2kg   2/2 bath  [facility] TDC    Heparin none - last OP HD 12/19, post wt 70.0kg, coming off 0-3 kg over the last 3 wks - mircera 50 mcg q2, last 12/10, due 12/24 - hectorol 4 mcg   Assessment/Plan: 1. Acute on chronic hypoxic respiratory failure - on home O2 2-2.5L. Pulmonary edema on initial CXR.  On Doxy. Breathing better today after extra HD, no crackles.  Remains on 5 L O2.    May need lower dry weight.  2. Acute PE - seen on CTA. Heparin infusion. Per PMD.  3. ESRD - on TTS.  Extra HD on Friday.  Extra HD on Sunday with 2.5 L ultrafiltration.  Holiday schedule this week running Mon, [place], Sat.  Tolerated HD overnight finishing late Monday evening.  Patient appears to be very comfortable.  Next dialysis on Thursday..  4. Anemia of CKD- Hgb 11.1. No indication for ESA.  5. Secondary hyperparathyroidism - Calcium ok. Phos elevated.  Continue VDRA and binders.  6. HYPOtension- BP in goal. On midodrine.   7. Nutrition - Renal diet w/fluid restrictions.  8. Prolonged QTc - per PMD 9. A fib 10. DMT2 11. Chronic systolic HF - EF 96-04%   Subjective:   Patient seen and examined at bedside.  SOB improved after an dialysis treatment yesterday.  Denies CP, SOB, abdominal pain and n/v/d.    Objective Vitals:   01/01/23 0420 01/01/23 0425 01/01/23 0430 01/01/23 0508  BP:  91/62  120/70  Pulse: 78 77 77   Resp:    18  Temp:    98.2 F (36.8 C)  TempSrc:    Oral  SpO2: 96% 96% 96%   Weight:      Height:       Physical Exam General:WDWN, pleasant male in NAD, speaking in full sentences Heart:RRR, no mrg Lungs:CTAB, NL WOB on 5L O2 Abdomen:soft, NTND Extremities:no LE edema Dialysis Access: [facility] TDC, thrombosed left arm access  Filed Weights   12/29/22 0432 12/30/22 1122 12/30/22 1459  Weight: 70.5 kg 69.6 kg 67.2 kg   No intake or output data in the 24 hours ending 01/01/23  0748   Additional Objective Labs: Basic Metabolic Panel: Recent Labs  Lab 12/30/22 0414 12/31/22 0454 12/31/22 2229  NA 135 131* 133*  K 4.5 4.5 4.3  CL 96* 94* 94*  CO2 20* 23 22  GLUCOSE 130* 129* 147*  BUN 63* 51* 74*  CREATININE 7.48* 5.58* 7.82*  CALCIUM 9.5 9.4 9.4  PHOS 8.7* 6.5* 7.4*   Liver Function Tests: Recent Labs  Lab 12/27/22 1602 12/28/22 0432 12/29/22 0452 12/30/22 0414 12/31/22 0454 12/31/22 2229  AST 30 29  --   --   --   --   ALT 18 16  --   --   --   --   ALKPHOS 85 77  --   --   --   --   BILITOT 0.9 1.0  --   --   --   --   PROT 7.6 6.9  --   --   --   --   ALBUMIN 2.4* 2.2*   < > 2.4* 2.5* 2.4*   < > = values in this interval not displayed.   CBC: Recent Labs  Lab 12/28/22 0432 12/28/22 0436 12/29/22 0452 12/30/22 0414 12/31/22 0454 12/31/22 2229  WBC 3.9*  --  4.1 5.6 7.7 5.5  NEUTROABS  --   --  3.7 5.1 6.6  --   HGB 9.3*   < > 11.1* 10.9* 12.1* 12.3*  HCT 31.4*   < > 36.8* 35.8* 39.9 40.6  MCV 86.5  --  85.4 84.2 83.5 84.6  PLT 95*  --  126* 129* 135* 96*   < > = values in this interval not displayed.   Blood Culture    Component Value Date/Time   SDES FLUID 09/16/2022 1617   SPECREQUEST NONE 09/16/2022 1617   CULT  09/16/2022 1617    FEW ESCHERICHIA COLI FEW BACTEROIDES OVATUS MODERATE BACTEROIDES FRAGILIS RARE STREPTOCOCCUS ANGINOSIS BETA LACTAMASE POSITIVE Performed at [facility], [address] [place], [place] [address]    REPTSTATUS 09/21/2022 FINAL 09/16/2022 1617    CBG: Recent Labs  Lab 12/30/22 2126 12/31/22 0825 12/31/22 1219 12/31/22 1600 12/31/22 2110  GLUCAP 129* 114* 136* 140* 137*    Medications:    amiodarone  200 mg Oral Daily   apixaban  5 mg Oral BID   Chlorhexidine Gluconate Cloth  6 each Topical Q0600   doxercalciferol  4 mcg Intravenous Q T,Th,Sa-HD   doxycycline  100 mg Oral Q12H   insulin aspart  0-6 Units Subcutaneous TID WC   ipratropium-albuterol  3 mL  Nebulization Q6H   midodrine  10 mg Oral Q T,Th,Sa-HD   predniSONE  40 mg Oral Q breakfast   sucroferric oxyhydroxide  1,000 mg Oral BID PC

## 2023-01-01 NOTE — Discharge Instructions (Signed)
Follow with Primary MD Claiborne Rigg, NP in 7 days   Get CBC, CMP, 2 view Chest X ray -  checked next visit with your primary MD    Activity: As tolerated with Full fall precautions use walker/cane & assistance as needed  Disposition Home    Diet: Renal diet with 1.5 L fluid restriction  Special Instructions: If you have smoked or chewed Tobacco  in the last 2 yrs please stop smoking, stop any regular Alcohol  and or any Recreational drug use.  On your next visit with your primary care physician please Get Medicines reviewed and adjusted.  Please request your Prim.MD to go over all Hospital Tests and Procedure/Radiological results at the follow up, please get all Hospital records sent to your Prim MD by signing hospital release before you go home.  If you experience worsening of your admission symptoms, develop shortness of breath, life threatening emergency, suicidal or homicidal thoughts you must seek medical attention immediately by calling 911 or calling your MD immediately  if symptoms less severe.  You Must read complete instructions/literature along with all the possible adverse reactions/side effects for all the Medicines you take and that have been prescribed to you. Take any new Medicines after you have completely understood and accpet all the possible adverse reactions/side effects.   Do not drive when taking Pain medications.  Do not take more than prescribed Pain, Sleep and Anxiety Medications

## 2023-01-03 ENCOUNTER — Telehealth: Payer: Self-pay | Admitting: Nurse Practitioner

## 2023-01-03 ENCOUNTER — Telehealth: Payer: Self-pay

## 2023-01-03 NOTE — Telephone Encounter (Signed)
Transition of care contact from inpatient facility  Date of Discharge: 01/01/2023 Date of Contact: 01/03/2023 Method of contact: Phone  Attempted to contact patient to discuss transition of care from inpatient admission. Patient did not answer the phone. Message was left on the patient's voicemail with call back number [phone].

## 2023-01-03 NOTE — Transitions of Care (Post Inpatient/ED Visit) (Signed)
   01/03/2023  Name: [name] MRN: [id number] DOB: 1960-09-30  Today's TOC FU Call Status: Today's TOC FU Call Status:: Unsuccessful Call (2nd Attempt) Unsuccessful Call (2nd Attempt) Date: 01/03/23  Attempted to reach the patient regarding the most recent Inpatient/ED visit.  Follow Up Plan: Additional outreach attempts will be made to reach the patient to complete the Transitions of Care (Post Inpatient/ED visit) call.   Alyse Low, RN, BA, [facility], CRRN [facility] Coordinator, Transition of Care Ph # [phone]

## 2023-01-03 NOTE — Progress Notes (Signed)
Late Note Entry- Jan 03, 2023  Pt was d/c on Tuesday. Contacted [facility] this morning regarding pt's d/c date and to be advised that pt should resume today.   Olivia Canter Renal Navigator [phone]

## 2023-01-03 NOTE — Transitions of Care (Post Inpatient/ED Visit) (Signed)
   01/03/2023  Name: Nathaniel White MRN: [id number] DOB: 12-Jan-1960  Today's TOC FU Call Status: Today's TOC FU Call Status:: Unsuccessful Call (1st Attempt) Unsuccessful Call (1st Attempt) Date: 01/03/23  Attempted to reach the patient regarding the most recent Inpatient/ED visit.  Follow Up Plan: Additional outreach attempts will be made to reach the patient to complete the Transitions of Care (Post Inpatient/ED visit) call.   Signature Robyne Peers, RN

## 2023-01-04 ENCOUNTER — Telehealth: Payer: Self-pay

## 2023-01-04 NOTE — Transitions of Care (Post Inpatient/ED Visit) (Signed)
   01/04/2023  Name: Nathaniel White MRN: [id number] DOB: 1960-12-11  Today's TOC FU Call Status: Today's TOC FU Call Status:: Unsuccessful Call (2nd Attempt) Unsuccessful Call (2nd Attempt) Date: 01/04/23  Attempted to reach the patient regarding the most recent Inpatient/ED visit.  Follow Up Plan: Additional outreach attempts will be made to reach the patient to complete the Transitions of Care (Post Inpatient/ED visit) call.   Alyse Low, RN, BA, [facility], CRRN [facility] Coordinator, Transition of Care Ph # [phone]

## 2023-01-07 ENCOUNTER — Telehealth: Payer: Self-pay

## 2023-01-07 NOTE — Transitions of Care (Post Inpatient/ED Visit) (Signed)
   01/07/2023  Name: Nathaniel White MRN: [id number] DOB: 11-03-1960  Today's TOC FU Call Status: Today's TOC FU Call Status:: Unsuccessful Call (3rd Attempt) Unsuccessful Call (3rd Attempt) Date: 01/07/23  Attempted to reach the patient regarding the most recent Inpatient/ED visit.  Follow Up Plan: No further outreach attempts will be made at this time. We have been unable to contact the patient.  Alyse Low, RN, BA, [facility], CRRN [facility] Coordinator, Transition of Care Ph # [phone]

## 2023-01-07 NOTE — Transitions of Care (Post Inpatient/ED Visit) (Signed)
   01/07/2023  Name: Nathaniel White MRN: [id number] DOB: 02/22/1960  Today's TOC FU Call Status: Today's TOC FU Call Status:: Unsuccessful Call (2nd Attempt) Unsuccessful Call (1st Attempt) Date: 01/03/23 Unsuccessful Call (2nd Attempt) Date: 01/07/23  Attempted to reach the patient regarding the most recent Inpatient/ED visit.  Follow Up Plan: Additional outreach attempts will be made to reach the patient to complete the Transitions of Care (Post Inpatient/ED visit) call.   Signature  Robyne Peers, RN

## 2023-01-08 ENCOUNTER — Telehealth: Payer: Self-pay

## 2023-01-08 NOTE — Telephone Encounter (Signed)
Letter sent to patient requesting he contact [facility] to schedule a follow up appointment as we have not been able to reach him

## 2023-01-15 ENCOUNTER — Emergency Department (HOSPITAL_COMMUNITY): Payer: Medicare Other

## 2023-01-15 ENCOUNTER — Inpatient Hospital Stay (HOSPITAL_COMMUNITY)
Admission: EM | Admit: 2023-01-15 | Discharge: 2023-01-19 | DRG: 291 | Disposition: A | Discharge status: Home / Self Care | Payer: Medicare Other | Attending: Internal Medicine | Admitting: Internal Medicine

## 2023-01-15 ENCOUNTER — Encounter (HOSPITAL_COMMUNITY): Payer: Self-pay

## 2023-01-15 ENCOUNTER — Other Ambulatory Visit: Payer: Self-pay

## 2023-01-15 DIAGNOSIS — I132 Hypertensive heart and chronic kidney disease with heart failure and with stage 5 chronic kidney disease, or end stage renal disease: Secondary | ICD-10-CM | POA: Diagnosis present

## 2023-01-15 DIAGNOSIS — Z91199 Patient's noncompliance with other medical treatment and regimen due to unspecified reason: Secondary | ICD-10-CM

## 2023-01-15 DIAGNOSIS — I2782 Chronic pulmonary embolism: Secondary | ICD-10-CM | POA: Diagnosis present

## 2023-01-15 DIAGNOSIS — Z794 Long term (current) use of insulin: Secondary | ICD-10-CM

## 2023-01-15 DIAGNOSIS — J9601 Acute respiratory failure with hypoxia: Secondary | ICD-10-CM | POA: Diagnosis present

## 2023-01-15 DIAGNOSIS — E1122 Type 2 diabetes mellitus with diabetic chronic kidney disease: Secondary | ICD-10-CM | POA: Diagnosis present

## 2023-01-15 DIAGNOSIS — D631 Anemia in chronic kidney disease: Secondary | ICD-10-CM | POA: Diagnosis present

## 2023-01-15 DIAGNOSIS — I700 Atherosclerosis of aorta: Secondary | ICD-10-CM | POA: Diagnosis present

## 2023-01-15 DIAGNOSIS — K7460 Unspecified cirrhosis of liver: Secondary | ICD-10-CM | POA: Diagnosis present

## 2023-01-15 DIAGNOSIS — Z79899 Other long term (current) drug therapy: Secondary | ICD-10-CM

## 2023-01-15 DIAGNOSIS — Z86711 Personal history of pulmonary embolism: Secondary | ICD-10-CM

## 2023-01-15 DIAGNOSIS — N2581 Secondary hyperparathyroidism of renal origin: Secondary | ICD-10-CM | POA: Diagnosis present

## 2023-01-15 DIAGNOSIS — Z992 Dependence on renal dialysis: Secondary | ICD-10-CM

## 2023-01-15 DIAGNOSIS — D693 Immune thrombocytopenic purpura: Secondary | ICD-10-CM

## 2023-01-15 DIAGNOSIS — I480 Paroxysmal atrial fibrillation: Secondary | ICD-10-CM | POA: Diagnosis present

## 2023-01-15 DIAGNOSIS — Z91119 Patient's noncompliance with dietary regimen due to unspecified reason: Secondary | ICD-10-CM

## 2023-01-15 DIAGNOSIS — J810 Acute pulmonary edema: Secondary | ICD-10-CM

## 2023-01-15 DIAGNOSIS — Z885 Allergy status to narcotic agent status: Secondary | ICD-10-CM

## 2023-01-15 DIAGNOSIS — I447 Left bundle-branch block, unspecified: Secondary | ICD-10-CM | POA: Diagnosis present

## 2023-01-15 DIAGNOSIS — R7989 Other specified abnormal findings of blood chemistry: Secondary | ICD-10-CM

## 2023-01-15 DIAGNOSIS — E875 Hyperkalemia: Secondary | ICD-10-CM

## 2023-01-15 DIAGNOSIS — F419 Anxiety disorder, unspecified: Secondary | ICD-10-CM | POA: Diagnosis present

## 2023-01-15 DIAGNOSIS — I509 Heart failure, unspecified: Secondary | ICD-10-CM

## 2023-01-15 DIAGNOSIS — Z1152 Encounter for screening for COVID-19: Secondary | ICD-10-CM

## 2023-01-15 DIAGNOSIS — J189 Pneumonia, unspecified organism: Secondary | ICD-10-CM | POA: Diagnosis present

## 2023-01-15 DIAGNOSIS — I9589 Other hypotension: Secondary | ICD-10-CM

## 2023-01-15 DIAGNOSIS — J9690 Respiratory failure, unspecified, unspecified whether with hypoxia or hypercapnia: Secondary | ICD-10-CM | POA: Diagnosis present

## 2023-01-15 DIAGNOSIS — E1165 Type 2 diabetes mellitus with hyperglycemia: Secondary | ICD-10-CM | POA: Diagnosis present

## 2023-01-15 DIAGNOSIS — J9621 Acute and chronic respiratory failure with hypoxia: Principal | ICD-10-CM

## 2023-01-15 DIAGNOSIS — J9622 Acute and chronic respiratory failure with hypercapnia: Secondary | ICD-10-CM | POA: Diagnosis present

## 2023-01-15 DIAGNOSIS — I69354 Hemiplegia and hemiparesis following cerebral infarction affecting left non-dominant side: Secondary | ICD-10-CM | POA: Diagnosis not present

## 2023-01-15 DIAGNOSIS — N186 End stage renal disease: Secondary | ICD-10-CM | POA: Diagnosis present

## 2023-01-15 DIAGNOSIS — I5023 Acute on chronic systolic (congestive) heart failure: Secondary | ICD-10-CM | POA: Diagnosis present

## 2023-01-15 DIAGNOSIS — Z7984 Long term (current) use of oral hypoglycemic drugs: Secondary | ICD-10-CM

## 2023-01-15 DIAGNOSIS — J440 Chronic obstructive pulmonary disease with acute lower respiratory infection: Secondary | ICD-10-CM | POA: Diagnosis present

## 2023-01-15 DIAGNOSIS — Z87891 Personal history of nicotine dependence: Secondary | ICD-10-CM

## 2023-01-15 DIAGNOSIS — Z888 Allergy status to other drugs, medicaments and biological substances status: Secondary | ICD-10-CM

## 2023-01-15 DIAGNOSIS — E119 Type 2 diabetes mellitus without complications: Secondary | ICD-10-CM

## 2023-01-15 DIAGNOSIS — E1151 Type 2 diabetes mellitus with diabetic peripheral angiopathy without gangrene: Secondary | ICD-10-CM | POA: Diagnosis present

## 2023-01-15 DIAGNOSIS — Z8249 Family history of ischemic heart disease and other diseases of the circulatory system: Secondary | ICD-10-CM

## 2023-01-15 DIAGNOSIS — Z833 Family history of diabetes mellitus: Secondary | ICD-10-CM

## 2023-01-15 DIAGNOSIS — J441 Chronic obstructive pulmonary disease with (acute) exacerbation: Secondary | ICD-10-CM | POA: Diagnosis present

## 2023-01-15 DIAGNOSIS — Z86718 Personal history of other venous thrombosis and embolism: Secondary | ICD-10-CM

## 2023-01-15 DIAGNOSIS — I428 Other cardiomyopathies: Secondary | ICD-10-CM | POA: Diagnosis present

## 2023-01-15 DIAGNOSIS — E8770 Fluid overload, unspecified: Secondary | ICD-10-CM

## 2023-01-15 DIAGNOSIS — Z7951 Long term (current) use of inhaled steroids: Secondary | ICD-10-CM

## 2023-01-15 DIAGNOSIS — R5381 Other malaise: Secondary | ICD-10-CM | POA: Diagnosis present

## 2023-01-15 DIAGNOSIS — Z7901 Long term (current) use of anticoagulants: Secondary | ICD-10-CM

## 2023-01-15 DIAGNOSIS — R0602 Shortness of breath: Secondary | ICD-10-CM | POA: Diagnosis present

## 2023-01-15 DIAGNOSIS — R001 Bradycardia, unspecified: Secondary | ICD-10-CM | POA: Diagnosis present

## 2023-01-15 LAB — I-STAT VENOUS BLOOD GAS, ED
Acid-Base Excess: 1 mmol/L (ref 0.0–2.0)
Acid-Base Excess: 1 mmol/L (ref 0.0–2.0)
Bicarbonate: 27.3 mmol/L (ref 20.0–28.0)
Bicarbonate: 28.4 mmol/L — ABNORMAL HIGH (ref 20.0–28.0)
Calcium, Ion: 0.94 mmol/L — ABNORMAL LOW (ref 1.15–1.40)
Calcium, Ion: 0.99 mmol/L — ABNORMAL LOW (ref 1.15–1.40)
HCT: 40 % (ref 39.0–52.0)
HCT: 42 % (ref 39.0–52.0)
Hemoglobin: 13.6 g/dL (ref 13.0–17.0)
Hemoglobin: 14.3 g/dL (ref 13.0–17.0)
O2 Saturation: 52 %
O2 Saturation: 49 %
Potassium: 6.2 mmol/L — ABNORMAL HIGH (ref 3.5–5.1)
Potassium: 5.9 mmol/L — ABNORMAL HIGH (ref 3.5–5.1)
Sodium: 135 mmol/L (ref 135–145)
Sodium: 136 mmol/L (ref 135–145)
TCO2: 29 mmol/L (ref 22–32)
TCO2: 30 mmol/L (ref 22–32)
pCO2, Ven: 47.2 mm[Hg] (ref 44–60)
pCO2, Ven: 57.6 mm[Hg] (ref 44–60)
pH, Ven: 7.369 (ref 7.25–7.43)
pH, Ven: 7.302 (ref 7.25–7.43)
pO2, Ven: 29 mm[Hg] — CL (ref 32–45)
pO2, Ven: 30 mm[Hg] — CL (ref 32–45)

## 2023-01-15 LAB — CBC WITH DIFFERENTIAL/PLATELET
Abs Immature Granulocytes: 0.02 10*3/uL (ref 0.00–0.07)
Basophils Absolute: 0.1 10*3/uL (ref 0.0–0.1)
Basophils Relative: 1 %
Eosinophils Absolute: 0.5 10*3/uL (ref 0.0–0.5)
Eosinophils Relative: 6 %
HCT: 40.2 % (ref 39.0–52.0)
Hemoglobin: 12.1 g/dL — ABNORMAL LOW (ref 13.0–17.0)
Immature Granulocytes: 0 %
Lymphocytes Relative: 17 %
Lymphs Abs: 1.4 10*3/uL (ref 0.7–4.0)
MCH: 25.6 pg — ABNORMAL LOW (ref 26.0–34.0)
MCHC: 30.1 g/dL (ref 30.0–36.0)
MCV: 85.2 fL (ref 80.0–100.0)
Monocytes Absolute: 0.8 10*3/uL (ref 0.1–1.0)
Monocytes Relative: 10 %
Neutro Abs: 5.8 10*3/uL (ref 1.7–7.7)
Neutrophils Relative %: 66 %
Platelets: 85 10*3/uL — ABNORMAL LOW (ref 150–400)
RBC: 4.72 MIL/uL (ref 4.22–5.81)
RDW: 18.6 % — ABNORMAL HIGH (ref 11.5–15.5)
WBC: 8.6 10*3/uL (ref 4.0–10.5)
nRBC: 0 % (ref 0.0–0.2)

## 2023-01-15 LAB — RESPIRATORY PANEL BY PCR

## 2023-01-15 LAB — CBC
HCT: 41.1 % (ref 39.0–52.0)
Hemoglobin: 12.3 g/dL — ABNORMAL LOW (ref 13.0–17.0)
MCH: 25.4 pg — ABNORMAL LOW (ref 26.0–34.0)
MCHC: 29.9 g/dL — ABNORMAL LOW (ref 30.0–36.0)
MCV: 84.9 fL (ref 80.0–100.0)
Platelets: 95 10*3/uL — ABNORMAL LOW (ref 150–400)
RBC: 4.84 MIL/uL (ref 4.22–5.81)
RDW: 18.6 % — ABNORMAL HIGH (ref 11.5–15.5)
WBC: 7.9 10*3/uL (ref 4.0–10.5)
nRBC: 0 % (ref 0.0–0.2)

## 2023-01-15 LAB — COMPREHENSIVE METABOLIC PANEL
ALT: 35 U/L (ref 0–44)
ALT: 36 U/L (ref 0–44)
AST: 27 U/L (ref 15–41)
AST: 26 U/L (ref 15–41)
Albumin: 2.3 g/dL — ABNORMAL LOW (ref 3.5–5.0)
Albumin: 2.3 g/dL — ABNORMAL LOW (ref 3.5–5.0)
Alkaline Phosphatase: 107 U/L (ref 38–126)
Alkaline Phosphatase: 112 U/L (ref 38–126)
Anion gap: 19 — ABNORMAL HIGH (ref 5–15)
Anion gap: 19 — ABNORMAL HIGH (ref 5–15)
BUN: 78 mg/dL — ABNORMAL HIGH (ref 6–20)
BUN: 79 mg/dL — ABNORMAL HIGH (ref 6–20)
CO2: 22 mmol/L (ref 22–32)
CO2: 23 mmol/L (ref 22–32)
Calcium: 8.7 mg/dL — ABNORMAL LOW (ref 8.9–10.3)
Calcium: 8.7 mg/dL — ABNORMAL LOW (ref 8.9–10.3)
Chloride: 96 mmol/L — ABNORMAL LOW (ref 98–111)
Chloride: 96 mmol/L — ABNORMAL LOW (ref 98–111)
Creatinine, Ser: 10.1 mg/dL — ABNORMAL HIGH (ref 0.61–1.24)
Creatinine, Ser: 10.09 mg/dL — ABNORMAL HIGH (ref 0.61–1.24)
GFR, Estimated: 5 mL/min — ABNORMAL LOW (ref >60)
GFR, Estimated: 5 mL/min — ABNORMAL LOW (ref >60)
Glucose, Bld: 127 mg/dL — ABNORMAL HIGH (ref 70–99)
Glucose, Bld: 131 mg/dL — ABNORMAL HIGH (ref 70–99)
Potassium: 6.5 mmol/L (ref 3.5–5.1)
Potassium: 6.1 mmol/L — ABNORMAL HIGH (ref 3.5–5.1)
Sodium: 137 mmol/L (ref 135–145)
Sodium: 138 mmol/L (ref 135–145)
Total Bilirubin: 0.7 mg/dL (ref 0.0–1.2)
Total Bilirubin: 0.8 mg/dL (ref 0.0–1.2)
Total Protein: 7.5 g/dL (ref 6.5–8.1)
Total Protein: 7.8 g/dL (ref 6.5–8.1)

## 2023-01-15 LAB — I-STAT ARTERIAL BLOOD GAS, ED
Acid-base deficit: 2 mmol/L (ref 0.0–2.0)
Bicarbonate: 24.1 mmol/L (ref 20.0–28.0)
Calcium, Ion: 1.06 mmol/L — ABNORMAL LOW (ref 1.15–1.40)
HCT: 36 % — ABNORMAL LOW (ref 39.0–52.0)
Hemoglobin: 12.2 g/dL — ABNORMAL LOW (ref 13.0–17.0)
O2 Saturation: 99 %
Patient temperature: 98.1
Potassium: 6.1 mmol/L — ABNORMAL HIGH (ref 3.5–5.1)
Sodium: 134 mmol/L — ABNORMAL LOW (ref 135–145)
TCO2: 25 mmol/L (ref 22–32)
pCO2 arterial: 42.8 mm[Hg] (ref 32–48)
pH, Arterial: 7.357 (ref 7.35–7.45)
pO2, Arterial: 172 mm[Hg] — ABNORMAL HIGH (ref 83–108)

## 2023-01-15 LAB — RESP PANEL BY RT-PCR (RSV, FLU A&B, COVID)  RVPGX2
Influenza A by PCR: NEGATIVE
Influenza B by PCR: NEGATIVE
Resp Syncytial Virus by PCR: NEGATIVE
SARS Coronavirus 2 by RT PCR: NEGATIVE

## 2023-01-15 LAB — GLUCOSE, CAPILLARY
Glucose-Capillary: 97 mg/dL (ref 70–99)
Glucose-Capillary: 131 mg/dL — ABNORMAL HIGH (ref 70–99)
Glucose-Capillary: 142 mg/dL — ABNORMAL HIGH (ref 70–99)
Glucose-Capillary: 156 mg/dL — ABNORMAL HIGH (ref 70–99)
Glucose-Capillary: 127 mg/dL — ABNORMAL HIGH (ref 70–99)

## 2023-01-15 LAB — CBG MONITORING, ED
Glucose-Capillary: 111 mg/dL — ABNORMAL HIGH (ref 70–99)
Glucose-Capillary: 121 mg/dL — ABNORMAL HIGH (ref 70–99)
Glucose-Capillary: 101 mg/dL — ABNORMAL HIGH (ref 70–99)

## 2023-01-15 LAB — BRAIN NATRIURETIC PEPTIDE: B Natriuretic Peptide: 3540.1 pg/mL — ABNORMAL HIGH (ref 0.0–100.0)

## 2023-01-15 LAB — HEPATITIS B SURFACE ANTIGEN: Hepatitis B Surface Ag: NONREACTIVE

## 2023-01-15 LAB — PHOSPHORUS: Phosphorus: 11.2 mg/dL — ABNORMAL HIGH (ref 2.5–4.6)

## 2023-01-15 LAB — MRSA NEXT GEN BY PCR, NASAL: MRSA by PCR Next Gen: NOT DETECTED

## 2023-01-15 LAB — PROCALCITONIN: Procalcitonin: 1.3 ng/mL

## 2023-01-15 LAB — TROPONIN I (HIGH SENSITIVITY): Troponin I (High Sensitivity): 117 ng/L (ref <18)

## 2023-01-15 MED ORDER — METHYLPREDNISOLONE SODIUM SUCC 40 MG IJ SOLR
40.0000 mg | Freq: Every day | INTRAMUSCULAR | Status: DC
  Administered 2023-01-16: 40 mg via INTRAVENOUS
  Filled 2023-01-15: qty 1

## 2023-01-15 MED ORDER — NITROGLYCERIN 2 % TD OINT
1.0000 [in_us] | TOPICAL_OINTMENT | Freq: Once | TRANSDERMAL | Status: AC
  Administered 2023-01-15: 1 [in_us] via TOPICAL
  Filled 2023-01-15: qty 1

## 2023-01-15 MED ORDER — INSULIN ASPART 100 UNIT/ML IV SOLN
5.0000 [IU] | Freq: Once | INTRAVENOUS | Status: AC
  Administered 2023-01-15: 5 [IU] via INTRAVENOUS

## 2023-01-15 MED ORDER — SODIUM CHLORIDE 0.9 % IV SOLN
250.0000 mL | INTRAVENOUS | Status: DC | PRN
Start: 2023-01-15 — End: 2023-01-15

## 2023-01-15 MED ORDER — DOXYCYCLINE HYCLATE 100 MG IV SOLR
100.0000 mg | Freq: Two times a day (BID) | INTRAVENOUS | Status: DC
  Administered 2023-01-15: 100 mg via INTRAVENOUS
  Filled 2023-01-15: qty 100

## 2023-01-15 MED ORDER — SODIUM CHLORIDE 0.9% FLUSH
3.0000 mL | INTRAVENOUS | Status: DC | PRN
Start: 2023-01-15 — End: 2023-01-15

## 2023-01-15 MED ORDER — SUCROFERRIC OXYHYDROXIDE 500 MG PO CHEW
1000.0000 mg | CHEWABLE_TABLET | Freq: Two times a day (BID) | ORAL | Status: DC
  Administered 2023-01-16 (×2): 1000 mg via ORAL
  Filled 2023-01-15 (×6): qty 2

## 2023-01-15 MED ORDER — METHYLPREDNISOLONE SODIUM SUCC 125 MG IJ SOLR: 60.0000 mg | Freq: Every day | INTRAMUSCULAR | Status: DC

## 2023-01-15 MED ORDER — HEPARIN SODIUM (PORCINE) 1000 UNIT/ML DIALYSIS
1000.0000 [IU] | INTRAMUSCULAR | Status: DC | PRN
  Administered 2023-01-15: 4200 [IU]
  Filled 2023-01-15: qty 1

## 2023-01-15 MED ORDER — MIDODRINE HCL 5 MG PO TABS
10.0000 mg | ORAL_TABLET | Freq: Three times a day (TID) | ORAL | Status: DC
  Administered 2023-01-15 – 2023-01-19 (×10): 10 mg via ORAL
  Filled 2023-01-15 (×12): qty 2

## 2023-01-15 MED ORDER — LORAZEPAM 2 MG/ML IJ SOLN
0.5000 mg | Freq: Once | INTRAMUSCULAR | Status: AC
  Administered 2023-01-15: 0.5 mg via INTRAVENOUS
  Filled 2023-01-15: qty 1

## 2023-01-15 MED ORDER — CHLORHEXIDINE GLUCONATE CLOTH 2 % EX PADS
6.0000 | MEDICATED_PAD | Freq: Every day | CUTANEOUS | Status: DC
  Administered 2023-01-15 – 2023-01-19 (×5): 6 via TOPICAL

## 2023-01-15 MED ORDER — ACETAMINOPHEN 325 MG PO TABS: 650.0000 mg | ORAL_TABLET | Freq: Four times a day (QID) | ORAL | Status: DC | PRN

## 2023-01-15 MED ORDER — INSULIN ASPART 100 UNIT/ML IJ SOLN: 0.0000 [IU] | INTRAMUSCULAR | Status: DC

## 2023-01-15 MED ORDER — HEPARIN SODIUM (PORCINE) 1000 UNIT/ML IJ SOLN
INTRAMUSCULAR | Status: AC
  Filled 2023-01-15: qty 5

## 2023-01-15 MED ORDER — SODIUM CHLORIDE 0.9 % IV SOLN
2.0000 g | INTRAVENOUS | Status: DC
  Administered 2023-01-15: 2 g via INTRAVENOUS
  Filled 2023-01-15: qty 20

## 2023-01-15 MED ORDER — ALBUTEROL SULFATE (2.5 MG/3ML) 0.083% IN NEBU
2.5000 mg | INHALATION_SOLUTION | Freq: Once | RESPIRATORY_TRACT | Status: AC
  Administered 2023-01-15: 2.5 mg via RESPIRATORY_TRACT
  Filled 2023-01-15: qty 3

## 2023-01-15 MED ORDER — SODIUM CHLORIDE 0.9 % IV SOLN
INTRAVENOUS | Status: DC
Start: 2023-01-15 — End: 2023-01-15

## 2023-01-15 MED ORDER — METHYLPREDNISOLONE SODIUM SUCC 125 MG IJ SOLR
60.0000 mg | Freq: Once | INTRAMUSCULAR | Status: AC
  Administered 2023-01-15: 60 mg via INTRAVENOUS
  Filled 2023-01-15: qty 2

## 2023-01-15 MED ORDER — SODIUM CHLORIDE 0.9% FLUSH
3.0000 mL | Freq: Two times a day (BID) | INTRAVENOUS | Status: DC
  Administered 2023-01-15: 3 mL via INTRAVENOUS

## 2023-01-15 MED ORDER — SODIUM CHLORIDE 0.9 % IV SOLN: 2.0000 g | INTRAVENOUS | Status: DC

## 2023-01-15 MED ORDER — CALCIUM GLUCONATE-NACL 1-0.675 GM/50ML-% IV SOLN
1.0000 g | INTRAVENOUS | Status: AC
  Administered 2023-01-15: 1000 mg via INTRAVENOUS
  Filled 2023-01-15: qty 50

## 2023-01-15 MED ORDER — ACETAMINOPHEN 650 MG RE SUPP: 650.0000 mg | Freq: Four times a day (QID) | RECTAL | Status: DC | PRN

## 2023-01-15 MED ORDER — ALBUTEROL SULFATE (2.5 MG/3ML) 0.083% IN NEBU: 2.5000 mg | INHALATION_SOLUTION | Freq: Once | RESPIRATORY_TRACT | Status: DC

## 2023-01-15 MED ORDER — SODIUM CHLORIDE 0.9 % IV SOLN: 1.0000 g | Freq: Once | INTRAVENOUS | Status: DC

## 2023-01-15 MED ORDER — LIDOCAINE-PRILOCAINE 2.5-2.5 % EX CREA: 1.0000 | TOPICAL_CREAM | CUTANEOUS | Status: DC | PRN

## 2023-01-15 MED ORDER — PIPERACILLIN-TAZOBACTAM IN DEX 2-0.25 GM/50ML IV SOLN
2.2500 g | Freq: Three times a day (TID) | INTRAVENOUS | Status: AC
  Administered 2023-01-15 – 2023-01-16 (×5): 2.25 g via INTRAVENOUS
  Filled 2023-01-15 (×6): qty 50

## 2023-01-15 MED ORDER — SODIUM CHLORIDE 0.9% FLUSH: 3.0000 mL | Freq: Two times a day (BID) | INTRAVENOUS | Status: DC

## 2023-01-15 MED ORDER — PENTAFLUOROPROP-TETRAFLUOROETH EX AERO: 1.0000 | INHALATION_SPRAY | CUTANEOUS | Status: DC | PRN

## 2023-01-15 MED ORDER — LIDOCAINE HCL (PF) 1 % IJ SOLN: 5.0000 mL | INTRAMUSCULAR | Status: DC | PRN

## 2023-01-15 MED ORDER — AMIODARONE HCL 200 MG PO TABS
200.0000 mg | ORAL_TABLET | Freq: Every day | ORAL | Status: DC
  Administered 2023-01-16: 200 mg via ORAL
  Filled 2023-01-15: qty 1

## 2023-01-15 MED ORDER — POLYETHYLENE GLYCOL 3350 17 G PO PACK: 17.0000 g | PACK | Freq: Every day | ORAL | Status: DC | PRN

## 2023-01-15 MED ORDER — ANTICOAGULANT SODIUM CITRATE 4% (200MG/5ML) IV SOLN: 5.0000 mL | Status: DC | PRN

## 2023-01-15 MED ORDER — ALBUTEROL SULFATE (2.5 MG/3ML) 0.083% IN NEBU: 2.5000 mg | INHALATION_SOLUTION | RESPIRATORY_TRACT | Status: DC | PRN

## 2023-01-15 MED ORDER — DEXTROSE 50 % IV SOLN
1.0000 | Freq: Once | INTRAVENOUS | Status: AC
  Administered 2023-01-15: 50 mL via INTRAVENOUS
  Filled 2023-01-15: qty 50

## 2023-01-15 MED ORDER — LEVALBUTEROL HCL 0.63 MG/3ML IN NEBU: 0.6300 mg | INHALATION_SOLUTION | Freq: Four times a day (QID) | RESPIRATORY_TRACT | Status: DC | PRN

## 2023-01-15 MED ORDER — APIXABAN 5 MG PO TABS
5.0000 mg | ORAL_TABLET | Freq: Two times a day (BID) | ORAL | Status: DC
  Administered 2023-01-15 – 2023-01-19 (×8): 5 mg via ORAL
  Filled 2023-01-15 (×8): qty 1

## 2023-01-15 MED ORDER — DOCUSATE SODIUM 100 MG PO CAPS: 100.0000 mg | ORAL_CAPSULE | Freq: Two times a day (BID) | ORAL | Status: DC | PRN

## 2023-01-15 MED ORDER — SODIUM ZIRCONIUM CYCLOSILICATE 10 G PO PACK
10.0000 g | PACK | Freq: Once | ORAL | Status: AC
  Administered 2023-01-15: 10 g via ORAL
  Filled 2023-01-15: qty 1

## 2023-01-15 MED ORDER — ALBUTEROL SULFATE (2.5 MG/3ML) 0.083% IN NEBU
2.5000 mg | INHALATION_SOLUTION | RESPIRATORY_TRACT | Status: DC
  Administered 2023-01-15 – 2023-01-16 (×7): 2.5 mg via RESPIRATORY_TRACT
  Filled 2023-01-15 (×7): qty 3

## 2023-01-15 MED ORDER — INSULIN ASPART 100 UNIT/ML IJ SOLN
0.0000 [IU] | Freq: Three times a day (TID) | INTRAMUSCULAR | Status: DC
  Administered 2023-01-15: 1 [IU] via SUBCUTANEOUS
  Administered 2023-01-16: 3 [IU] via SUBCUTANEOUS

## 2023-01-15 MED ORDER — ALTEPLASE 2 MG IJ SOLR
2.0000 mg | Freq: Once | INTRAMUSCULAR | Status: DC | PRN
Start: 2023-01-15 — End: 2023-01-17

## 2023-01-15 MED ORDER — DOXYCYCLINE HYCLATE 100 MG PO TABS: 100.0000 mg | ORAL_TABLET | Freq: Once | ORAL | Status: DC

## 2023-01-15 MED ORDER — INSULIN ASPART 100 UNIT/ML IJ SOLN: 0.0000 [IU] | Freq: Every day | INTRAMUSCULAR | Status: DC

## 2023-01-15 MED ORDER — IPRATROPIUM-ALBUTEROL 0.5-2.5 (3) MG/3ML IN SOLN
3.0000 mL | Freq: Four times a day (QID) | RESPIRATORY_TRACT | Status: DC
  Administered 2023-01-15 – 2023-01-16 (×5): 3 mL via RESPIRATORY_TRACT
  Filled 2023-01-15 (×5): qty 3

## 2023-01-15 NOTE — Progress Notes (Signed)
 Patient is declining BiPAP use but he is agreeable to use the CPAP on.  BiPAP would be more beneficial in the setting of CHF exacerbation however as patient is agreeable for CPAP plan to do CPAP for now and dialysis.

## 2023-01-15 NOTE — Progress Notes (Addendum)
 Notifed Dr. Arlean Hopping of patient BP 77/58 and 83/61.Received verbal order to decrease goal to 3 liters and turn UF off until BP increases to SBP above 90. Patient reports being asymptomatic.

## 2023-01-15 NOTE — Progress Notes (Signed)
 Pharmacy Antibiotic Note  Nathaniel White is a 63 y.o. male admitted on 01/15/2023 on BiPAP, recent pna.  Pharmacy has been consulted for zosyn  dosing.  ESRD-HD usually TTS  Plan: Zosyn  2.25g IV q 8h Monitor HD schedule, clinical progression and LOT  Height: 5' 9 (175.3 cm) Weight: 74.8 kg (165 lb) IBW/kg (Calculated) : 70.7  Temp (24hrs), Avg:97.9 F (36.6 C), Min:97.4 F (36.3 C), Max:98.3 F (36.8 C)  Recent Labs  Lab 01/15/23 0325 01/15/23 0524  WBC 8.6 7.9  CREATININE 10.10* 10.09*    Estimated Creatinine Clearance: 7.8 mL/min (A) (by C-G formula based on SCr of 10.09 mg/dL (H)).    Allergies  Allergen Reactions   Codeine Shortness Of Breath   Dextromethorphan-Guaifenesin  Shortness Of Breath and Other (See Comments)   Iron Dextran Shortness Of Breath and Other (See Comments)   Morphine  And Codeine Shortness Of Breath    Other Reaction(s): Other (See Comments)    Dorn Poot, PharmD, [facility] Clinical Pharmacist ED Pharmacist Phone # [phone] 01/15/2023 10:28 AM

## 2023-01-15 NOTE — Progress Notes (Signed)
 Pt transported from ED room 30 to 4N16 without any complications.

## 2023-01-15 NOTE — Consult Note (Signed)
 NAME:  Nathaniel White, MRN:  [id number], DOB:  [birth year], LOS: 0 ADMISSION DATE:  01/15/2023, CONSULTATION DATE: 01/15/2023 REFERRING MD: Emergency department physician, CHIEF COMPLAINT: Acute respiratory failure  History of Present Illness:  Nathaniel White is a 63 year old male who has end-stage renal disease and has a permacath placed on the left.  He gets dialysis Tuesdays Thursdays Saturdays.  He has not missed dialysis.  They discharged from the hospital for respiratory ailments including pneumonia.  He has COPD from being a former smoker which she takes inhalers.  Due to the fact he cannot come off BiPAP and needs dialysis he will be admitted to the intensive care unit by pulmonary critical care.  Pertinent  Medical History   Past Medical History:  Diagnosis Date   Anemia    Anxiety    Asthma    Complication from renal dialysis device 11/02/2013   Complication of anesthesia 2017   according to pt and spouse pt was moving around and cough while under and pt had difficulty waking up so the anesthesia had to be reversed. and pt admitted to ICU.    COPD (chronic obstructive pulmonary disease) (HCC)    Diabetes mellitus without complication (HCC)    Type II - Patient states he does not have diabetes, they said sometimes when you start dialysis you don't have diabetes any longer    ESOPHAGEAL VARICES 10/04/2008   Qualifier: Diagnosis of  By: Aneita MD NOLIA [name]    ESRD    on HD, T-TH-Sat - [place]   Hemiparesis due to old stroke [facility])    left   HFrEF (heart failure with reduced ejection fraction) (HCC)    NICM // TTE 06/2022: EF 25-30, sever RV dysfunction, mod to severe MR   Hypertension    Hx, not current problems, no meds   LV dysfunction    EF 25-30% by echo 07/2011   Memory loss due to medical condition    due to stroke   MR (mitral regurgitation)    moderate to severe, echo 07/2011   Nausea with vomiting, unspecified 06/15/2021   Paroxysmal atrial fibrillation (HCC)     Peripheral vascular disease (HCC)    Shortness of breath    with exertion   Stroke (HCC)    TIA's-left sided weakness   Tobacco abuse      Significant Hospital Events: Including procedures, antibiotic start and stop dates in addition to other pertinent events     Interim History / Subjective:  Unable to come off BiPAP at this time  Objective   Blood pressure 124/73, pulse (!) 53, temperature (!) 97.4 F (36.3 C), temperature source Oral, resp. rate (!) 22, height 5' 9 (1.753 m), weight 74.8 kg, SpO2 92%.    Vent Mode: PCV;BIPAP FiO2 (%):  [60 %-100 %] 100 % Set Rate:  [12 bmp] 12 bmp  No intake or output data in the 24 hours ending 01/15/23 0911 Filed Weights   01/15/23 0324  Weight: 74.8 kg    Examination: General: Ill-appearing male is on noninvasive and desaturates when it is removed. HENT: No JVD or lymphadenopathy is appreciated Lungs: Creased breath sounds throughout Cardiovascular: Heart sounds are regular currently bradycardic.  56 Abdomen: Abdomen is tender slight distention Extremities: Right heel with dressing otherwise intact Neuro: Somewhat confused   Resolved Hospital Problem list     Assessment & Plan:  Acute respiratory distress in setting of end-stage renal disease last dialysis was Saturday he is due for dialysis  today Monday he is currently on noninvasive mechanical ventilatory support and cannot come off at this time. BiPAP as needed Hemodialysis for fluid management Empirical antimicrobial therapy with a recent history of pneumonia Admit to the intensive care unit Once stabilized he can go to progressive care and to [facility]  End-stage renal disease with dialysis Tuesdays Thursdays and Saturdays Nephrology is on board he is to be dialyzed today in the ICU while on BiPAP  Diabetes mellitus Sliding scale insulin  protocol   Best Practice (right click and Reselect all SmartList Selections daily)   Diet/type: NPO DVT  prophylaxis DOAC Pressure ulcer(s): Heel wound check pressure injury dressing has been applied wound care is following GI prophylaxis: PPI Lines: Central line Foley:  Yes, and it is still needed Code Status:  full code Last date of multidisciplinary goals of care discussion [tbd] Spoke to wife at bedside he is a full code Labs   CBC: Recent Labs  Lab 01/15/23 0325 01/15/23 0332 01/15/23 0524 01/15/23 0534  WBC 8.6  --  7.9  --   NEUTROABS 5.8  --   --   --   HGB 12.1* 13.6 12.3* 14.3  HCT 40.2 40.0 41.1 42.0  MCV 85.2  --  84.9  --   PLT 85*  --  95*  --     Basic Metabolic Panel: Recent Labs  Lab 01/15/23 0325 01/15/23 0332 01/15/23 0524 01/15/23 0534  NA 137 135 138 136  K 6.5* 6.2* 6.1* 5.9*  CL 96*  --  96*  --   CO2 22  --  23  --   GLUCOSE 127*  --  131*  --   BUN 78*  --  79*  --   CREATININE 10.10*  --  10.09*  --   CALCIUM  8.7*  --  8.7*  --    GFR: Estimated Creatinine Clearance: 7.8 mL/min (A) (by C-G formula based on SCr of 10.09 mg/dL (H)). Recent Labs  Lab 01/15/23 0325 01/15/23 0524  PROCALCITON  --  1.30  WBC 8.6 7.9    Liver Function Tests: Recent Labs  Lab 01/15/23 0325 01/15/23 0524  AST 27 26  ALT 35 36  ALKPHOS 107 112  BILITOT 0.7 0.8  PROT 7.5 7.8  ALBUMIN  2.3* 2.3*   No results for input(s): LIPASE, AMYLASE in the last 168 hours. No results for input(s): AMMONIA in the last 168 hours.  ABG    Component Value Date/Time   PHART 7.370 06/21/2022 1536   PCO2ART 34.6 06/21/2022 1536   PO2ART 77 (L) 06/21/2022 1536   HCO3 28.4 (H) 01/15/2023 0534   TCO2 30 01/15/2023 0534   ACIDBASEDEF 4.0 (H) 06/21/2022 1536   O2SAT 49 01/15/2023 0534     Coagulation Profile: No results for input(s): INR, PROTIME in the last 168 hours.  Cardiac Enzymes: No results for input(s): CKTOTAL, CKMB, CKMBINDEX, TROPONINI in the last 168 hours.  HbA1C: Hgb A1c MFr Bld  Date/Time Value Ref Range Status  11/29/2022 12:21  PM 4.8 4.8 - 5.6 % Final    Comment:    (NOTE) Pre diabetes:          5.7%-6.4%  Diabetes:              >6.4%  Glycemic control for   <7.0% adults with diabetes   09/05/2021 08:17 AM 5.2 4.8 - 5.6 % Final    Comment:    (NOTE) Pre diabetes:  5.7%-6.4%  Diabetes:              >6.4%  Glycemic control for   <7.0% adults with diabetes     CBG: Recent Labs  Lab 01/15/23 0330 01/15/23 0544 01/15/23 0905  GLUCAP 111* 121* 101*    Review of Systems:   na  Past Medical History:  He,  has a past medical history of Anemia, Anxiety, Asthma, Complication from renal dialysis device (11/02/2013), Complication of anesthesia (2017), COPD (chronic obstructive pulmonary disease) (HCC), Diabetes mellitus without complication (HCC), ESOPHAGEAL VARICES (10/04/2008), ESRD, Hemiparesis due to old stroke (HCC), HFrEF (heart failure with reduced ejection fraction) (HCC), Hypertension, LV dysfunction, Memory loss due to medical condition, MR (mitral regurgitation), Nausea with vomiting, unspecified (06/15/2021), Paroxysmal atrial fibrillation (HCC), Peripheral vascular disease (HCC), Shortness of breath, Stroke (HCC), and Tobacco abuse.   Surgical History:   Past Surgical History:  Procedure Laterality Date   ABDOMINAL AORTOGRAM W/LOWER EXTREMITY Bilateral 07/21/2018   Procedure: ABDOMINAL AORTOGRAM W/LOWER EXTREMITY;  Surgeon: Sheree Penne Bruckner, MD;  Location: [facility] INVASIVE CV LAB;  Service: Cardiovascular;  Laterality: Bilateral;   AMPUTATION Left 11/05/2018   Procedure: AMPUTATION SECOND TOE LEFT FOOT;  Surgeon: Sheree Penne Bruckner, MD;  Location: [facility] OR;  Service: Vascular;  Laterality: Left;   AMPUTATION Left 02/13/2021   Procedure: Left small finger AMPUTATION DIGIT;  Surgeon: Romona Harari, MD;  Location: [facility] OR;  Service: Orthopedics;  Laterality: Left;   AV FISTULA PLACEMENT     CARDIAC CATHETERIZATION     [facility]   COLONOSCOPY W/ BIOPSIES AND POLYPECTOMY      FISTULA SUPERFICIALIZATION Left 11/10/2013   Procedure: FISTULA PLICATION;  Surgeon: Gaile LELON New, MD;  Location: [facility] OR;  Service: Vascular;  Laterality: Left;   INSERTION OF DIALYSIS CATHETER N/A 06/20/2021   Procedure: INSERTION OF DIALYSIS CATHETER;  Surgeon: Lanis Fonda BRAVO, MD;  Location: [facility] OR;  Service: Vascular;  Laterality: N/A;   KIDNEY TRANSPLANT     2011 rejected kidney 2012 back on dialysis   LEFT AND RIGHT HEART CATHETERIZATION WITH CORONARY ANGIOGRAM N/A 10/09/2013   Procedure: LEFT AND RIGHT HEART CATHETERIZATION WITH CORONARY ANGIOGRAM;  Surgeon: Bruckner JONETTA Cash, MD;  Location: [facility] CATH LAB;  Service: Cardiovascular;  Laterality: N/A;   LIGATION OF ARTERIOVENOUS  FISTULA Left 06/20/2021   Procedure: LIGATION OF ARTERIOVENOUS  FISTULA;  Surgeon: Lanis Fonda BRAVO, MD;  Location: [facility] OR;  Service: Vascular;  Laterality: Left;   PERIPHERAL VASCULAR INTERVENTION Left 07/21/2018   Procedure: PERIPHERAL VASCULAR INTERVENTION;  Surgeon: Sheree Penne Bruckner, MD;  Location: [facility] INVASIVE CV LAB;  Service: Cardiovascular;  Laterality: Left;  Popliteal   REVISON OF ARTERIOVENOUS FISTULA Left 08/26/2013   Procedure: EXCISION OF ERODED SKIN AND EXPLORATION OF MAIN LEFT UPPER ARM AV FISTULA;  Surgeon: Krystal JULIANNA Doing, MD;  Location: [facility] OR;  Service: Vascular;  Laterality: Left;   REVISON OF ARTERIOVENOUS FISTULA Left 02/05/2014   Procedure: REPAIR OF ARTERIOVENOUS FISTULA ANEURYSM;  Surgeon: Gaile LELON New, MD;  Location: [facility] OR;  Service: Vascular;  Laterality: Left;   REVISON OF ARTERIOVENOUS FISTULA Left 03/10/2021   Procedure: LEFT ARM REVISION OF ARTERIOVENOUS FISTULA WITH PLICATION;  Surgeon: Sheree Penne Bruckner, MD;  Location: [facility] OR;  Service: Vascular;  Laterality: Left;   RIGHT/LEFT HEART CATH AND CORONARY ANGIOGRAPHY N/A 06/21/2022   Procedure: RIGHT/LEFT HEART CATH AND CORONARY ANGIOGRAPHY;  Surgeon: Burnard Debby LABOR, MD;  Location: [facility] INVASIVE CV LAB;  Service:  Cardiovascular;  Laterality: N/A;  SCROTAL EXPLORATION N/A 09/16/2022   Procedure: PENILE DEBRIDMENT;  Surgeon: Devere Lonni Righter, MD;  Location: [facility] OR;  Service: Urology;  Laterality: N/A;   SHUNTOGRAM N/A 11/05/2012   Procedure: Fistulogram;  Surgeon: Gaile LELON New, MD;  Location: [facility] CATH LAB;  Service: Cardiovascular;  Laterality: N/A;   TEE WITHOUT CARDIOVERSION  08/17/2011   Procedure: TRANSESOPHAGEAL ECHOCARDIOGRAM (TEE);  Surgeon: Ezra GORMAN Shuck, MD;  Location: [facility] ENDOSCOPY;  Service: Cardiovascular;  Laterality: N/A;   ULTRASOUND GUIDANCE FOR VASCULAR ACCESS  06/20/2021   Procedure: ULTRASOUND GUIDANCE FOR VASCULAR ACCESS;  Surgeon: Lanis Fonda BRAVO, MD;  Location: [facility] OR;  Service: Vascular;;   VIDEO ASSISTED THORACOSCOPY (VATS)/DECORTICATION  08/10/11     Social History:   reports that he quit smoking about 11 years ago. His smoking use included cigarettes. He started smoking about 26 years ago. He has a 7.5 pack-year smoking history. He has never used smokeless tobacco. He reports that he does not drink alcohol  and does not use drugs.   Family History:  His family history includes CAD in his paternal uncle; Cancer in his paternal grandfather; Diabetes in his paternal grandmother; Hypertension in his mother; Varicose Veins in his mother.   Allergies Allergies  Allergen Reactions   Codeine Shortness Of Breath   Dextromethorphan-Guaifenesin  Shortness Of Breath and Other (See Comments)   Iron Dextran Shortness Of Breath and Other (See Comments)   Morphine  And Codeine Shortness Of Breath    Other Reaction(s): Other (See Comments)     Home Medications  Prior to Admission medications   Medication Sig Start Date End Date Taking? Authorizing Provider  acetaminophen  (TYLENOL ) 500 MG tablet Take 1 tablet (500 mg total) by mouth every 6 (six) hours as needed. Patient taking differently: Take 500 mg by mouth every 6 (six) hours as needed for mild pain (pain score 1-3). 04/11/18  Yes  Burky, Natalie B, NP  albuterol  (PROAIR  HFA) 108 (90 Base) MCG/ACT inhaler Inhale 2 puffs into the lungs every 6 (six) hours as needed for wheezing or shortness of breath. 11/30/22  Yes Rai, Ripudeep K, MD  amiodarone  (PACERONE ) 200 MG tablet Take 1 tablet (200 mg total) by mouth daily. 09/25/22  Yes Pray, Rollene BRAVO, MD  ELIQUIS  5 MG TABS tablet Take 5 mg by mouth 2 (two) times daily. 12/09/21  Yes [provider]  midodrine  (PROAMATINE ) 10 MG tablet Take 1 tablet (10 mg total) by mouth 3 (three) times daily with meals. Patient taking differently: Take 10 mg by mouth 3 (three) times daily. TAKE 30 MINUTES BEFORE HEMODIALYSIS. TAKE 1 EXTRA TABLET MID TREATMENT 09/24/22  Yes Bryan Bianchi, MD  VELPHORO  500 MG chewable tablet Chew 1,000 mg by mouth 2 (two) times daily after a meal. 02/06/21  Yes [provider]  VISINE 0.05 % ophthalmic solution Place 2-3 drops into both eyes 2 (two) times daily as needed (dry eye). 07/04/21  Yes [provider]  polyethylene glycol (MIRALAX ) 17 g packet Take 17 g by mouth daily as needed for moderate constipation or mild constipation. Also available over-the-counter Patient not taking: Reported on 01/15/2023 11/30/22   Rai, Nydia POUR, MD  senna-docusate (SENOKOT-S) 8.6-50 MG tablet Take 2 tablets by mouth at bedtime. For constipation Patient not taking: Reported on 01/15/2023 11/30/22   Rai, Nydia POUR, MD  traZODone  (DESYREL ) 50 MG tablet Take 50 mg by mouth at bedtime as needed for sleep. Patient not taking: Reported on 01/15/2023    [provider]  Critical care time: 45 min    Marcey Joshau Code ACNP Acute Care Nurse Practitioner [facility]/Critical Care Please consult Amion 01/15/2023, 9:11 AM

## 2023-01-15 NOTE — Procedures (Signed)
 I was present at the procedure, reviewed the HD regimen and made appropriate changes.   Vinson Moselle MD  [facility] 01/15/2023, 3:45 PM

## 2023-01-15 NOTE — H&P (Addendum)
 History and Physical    Nathaniel White FMW:[id number] DOB: 02-25-1960 DOA: 01/15/2023  PCP: Theotis Haze ORN, NP   Patient coming from: Home   Chief Complaint:  Chief Complaint  Patient presents with   Shortness of Breath   ED TRIAGE note:  Patient coming from home. Patient is a dialysis patient tues, thurs, sat. Patient started having difficulty breathing that was getting progressively worse. Patient reports no missed dialysis. Chronically on 2L, but was sating at 81%. Rales were heard bilaterally by EMS. Placed on 7.5L on CPAP, EMS reported rales diminished. Recent respiratory illness   EMS VS 118/70      HPI:  Nathaniel White is a 63 y.o. male with medical history significant of ESRD on dialysis TTS schedule, chronic hypoxic respiratory failure 2 L oxygen at baseline, chronic thrombocytopenia, COPD, history of hepatic cirrhosis, history of pulmonary embolism on Eliquis , paroxysmal atrial fibrillation on Eliquis , chronic systolic heart failure with reduced EF 25 to 30%, anemia of chronic disease, CVA, and diet-controlled DM type II presented to emergency department complaining of worsening shortness of breath for last 24 hours.  Patient used to be on 2 L oxygen at the baseline however upon arrival to the scene EMS reported respiratory distress with with O2 sat dropped to 80s on 2 L.  Patient had been placed on CPAP reported improvement of the symptom. During my evaluation at the bedside patient reported that since patient has been placed on BiPAP earlier in the ED and after breathing treatment shortness of breath has been significantly improved currently O2 sat 86 to 87% on 6 L oxygen.  Patient also endorsing productive cough.  Denies any fever and chill.  Denies any chest pain, palpitation chest pressure.  Reported compliance with dialysis and last HD on Saturday. No other complaint at this time   Currently patient blood pressure 107/69 and on BiPAP O2 sat 100%. VBG showed pH 7.3,  pCO2 47 and pO2 29, bicarb 27. Elevated troponin 117. Elevated BNP 3541. CBC showed stable H&H 12.1 and 40, platelet count 85. CMP showing elevated potassium 6.5, low chloride 96, blood glucose 127, evidence of ESRD and elevated anion gap. Respiratory panel negative for COVID, flu and RSV.  Chest x-ray showed: IMPRESSION: 1. Increased interstitial and patchy airspace disease in the left mid and both lower lung fields which could be due to pneumonia or airspace edema. 2. Stable cardiomegaly with mild central vascular congestion and small pleural effusions. 3. Aortic atherosclerosis.  ED physician reported that with the concern for pneumonia and COPD exacerbation patient has been treated with albuterol  nebulizer and broad-spectrum antibiotic has been started.  For hyperkalemia patient has been shifted.  For volume overload and acute exacerbation of CHF ED physician Dr. Melvenia has been consulted nephrology and waiting for response.  Hospitalist has been contacted for management of acute hypoxic respiratory failure in the context of COPD exacerbation, CHF exacerbation, volume overload, pneumonia and hyperkalemia.   Significant labs in the ED: Lab Orders         Resp panel by RT-PCR (RSV, Flu A&B, Covid) Anterior Nasal Swab         Culture, blood (routine x 2) Call MD if unable to obtain prior to antibiotics being given         Respiratory (~20 pathogens) panel by PCR         Comprehensive metabolic panel         CBC with Differential/Platelet  Brain natriuretic peptide         Comprehensive metabolic panel         CBC         Procalcitonin         POC CBG, ED         I-Stat venous blood gas, ([facility] ED, [facility], [facility])       Review of Systems:  Review of Systems  Constitutional:  Negative for chills, fever, malaise/fatigue and weight loss.  Respiratory:  Positive for cough, sputum production, shortness of breath and wheezing.   Cardiovascular:  Negative for chest pain and  palpitations.  Gastrointestinal:  Negative for abdominal pain, heartburn, nausea and vomiting.  Musculoskeletal:  Negative for myalgias.  Neurological:  Negative for dizziness and headaches.  Psychiatric/Behavioral:  The patient is not nervous/anxious.   All other systems reviewed and are negative.   Past Medical History:  Diagnosis Date   Anemia    Anxiety    Asthma    Complication from renal dialysis device 11/02/2013   Complication of anesthesia 2017   according to pt and spouse pt was moving around and cough while under and pt had difficulty waking up so the anesthesia had to be reversed. and pt admitted to ICU.    COPD (chronic obstructive pulmonary disease) (HCC)    Diabetes mellitus without complication (HCC)    Type II - Patient states he does not have diabetes, they said sometimes when you start dialysis you don't have diabetes any longer    ESOPHAGEAL VARICES 10/04/2008   Qualifier: Diagnosis of  By: Aneita MD NOLIA [name]    ESRD    on HD, T-TH-Sat - [place]   Hemiparesis due to old stroke [facility])    left   HFrEF (heart failure with reduced ejection fraction) (HCC)    NICM // TTE 06/2022: EF 25-30, sever RV dysfunction, mod to severe MR   Hypertension    Hx, not current problems, no meds   LV dysfunction    EF 25-30% by echo 07/2011   Memory loss due to medical condition    due to stroke   MR (mitral regurgitation)    moderate to severe, echo 07/2011   Nausea with vomiting, unspecified 06/15/2021   Paroxysmal atrial fibrillation (HCC)    Peripheral vascular disease (HCC)    Shortness of breath    with exertion   Stroke (HCC)    TIA's-left sided weakness   Tobacco abuse     Past Surgical History:  Procedure Laterality Date   ABDOMINAL AORTOGRAM W/LOWER EXTREMITY Bilateral 07/21/2018   Procedure: ABDOMINAL AORTOGRAM W/LOWER EXTREMITY;  Surgeon: Sheree Penne Bruckner, MD;  Location: [facility] INVASIVE CV LAB;  Service: Cardiovascular;  Laterality: Bilateral;    AMPUTATION Left 11/05/2018   Procedure: AMPUTATION SECOND TOE LEFT FOOT;  Surgeon: Sheree Penne Bruckner, MD;  Location: [facility] OR;  Service: Vascular;  Laterality: Left;   AMPUTATION Left 02/13/2021   Procedure: Left small finger AMPUTATION DIGIT;  Surgeon: Romona Harari, MD;  Location: [facility] OR;  Service: Orthopedics;  Laterality: Left;   AV FISTULA PLACEMENT     CARDIAC CATHETERIZATION     [place] [facility]   COLONOSCOPY W/ BIOPSIES AND POLYPECTOMY     FISTULA SUPERFICIALIZATION Left 11/10/2013   Procedure: FISTULA PLICATION;  Surgeon: Gaile LELON New, MD;  Location: [facility] OR;  Service: Vascular;  Laterality: Left;   INSERTION OF DIALYSIS CATHETER N/A 06/20/2021   Procedure: INSERTION OF DIALYSIS CATHETER;  Surgeon: Lanis Fonda BRAVO, MD;  Location: [facility] OR;  Service: Vascular;  Laterality: N/A;   KIDNEY TRANSPLANT     2011 rejected kidney 2012 back on dialysis   LEFT AND RIGHT HEART CATHETERIZATION WITH CORONARY ANGIOGRAM N/A 10/09/2013   Procedure: LEFT AND RIGHT HEART CATHETERIZATION WITH CORONARY ANGIOGRAM;  Surgeon: Lonni JONETTA Cash, MD;  Location: [facility] CATH LAB;  Service: Cardiovascular;  Laterality: N/A;   LIGATION OF ARTERIOVENOUS  FISTULA Left 06/20/2021   Procedure: LIGATION OF ARTERIOVENOUS  FISTULA;  Surgeon: Lanis Fonda BRAVO, MD;  Location: [facility] OR;  Service: Vascular;  Laterality: Left;   PERIPHERAL VASCULAR INTERVENTION Left 07/21/2018   Procedure: PERIPHERAL VASCULAR INTERVENTION;  Surgeon: Sheree Penne Lonni, MD;  Location: [facility] INVASIVE CV LAB;  Service: Cardiovascular;  Laterality: Left;  Popliteal   REVISON OF ARTERIOVENOUS FISTULA Left 08/26/2013   Procedure: EXCISION OF ERODED SKIN AND EXPLORATION OF MAIN LEFT UPPER ARM AV FISTULA;  Surgeon: Krystal JULIANNA Doing, MD;  Location: [facility] OR;  Service: Vascular;  Laterality: Left;   REVISON OF ARTERIOVENOUS FISTULA Left 02/05/2014   Procedure: REPAIR OF ARTERIOVENOUS FISTULA ANEURYSM;  Surgeon: Gaile LELON New, MD;  Location[facility] OR;  Service:  Vascular;  Laterality: Left;   REVISON OF ARTERIOVENOUS FISTULA Left 03/10/2021   Procedure: LEFT ARM REVISION OF ARTERIOVENOUS FISTULA WITH PLICATION;  Surgeon: Sheree Penne Lonni, MD;  Location: [facility] OR;  Service: Vascular;  Laterality: Left;   RIGHT/LEFT HEART CATH AND CORONARY ANGIOGRAPHY N/A 06/21/2022   Procedure: RIGHT/LEFT HEART CATH AND CORONARY ANGIOGRAPHY;  Surgeon: Burnard Debby LABOR, MD;  Location: [facility] INVASIVE CV LAB;  Service: Cardiovascular;  Laterality: N/A;   SCROTAL EXPLORATION N/A 09/16/2022   Procedure: PENILE DEBRIDMENT;  Surgeon: Devere Lonni Righter, MD;  Location: [facility] OR;  Service: Urology;  Laterality: N/A;   SHUNTOGRAM N/A 11/05/2012   Procedure: Fistulogram;  Surgeon: Gaile LELON New, MD;  Location: [facility] CATH LAB;  Service: Cardiovascular;  Laterality: N/A;   TEE WITHOUT CARDIOVERSION  08/17/2011   Procedure: TRANSESOPHAGEAL ECHOCARDIOGRAM (TEE);  Surgeon: Ezra GORMAN Shuck, MD;  Location: [facility] ENDOSCOPY;  Service: Cardiovascular;  Laterality: N/A;   ULTRASOUND GUIDANCE FOR VASCULAR ACCESS  06/20/2021   Procedure: ULTRASOUND GUIDANCE FOR VASCULAR ACCESS;  Surgeon: Lanis Fonda BRAVO, MD;  Location: [facility] OR;  Service: Vascular;;   VIDEO ASSISTED THORACOSCOPY (VATS)/DECORTICATION  08/10/11     reports that he quit smoking about 11 years ago. His smoking use included cigarettes. He started smoking about 26 years ago. He has a 7.5 pack-year smoking history. He has never used smokeless tobacco. He reports that he does not drink alcohol  and does not use drugs.  Allergies  Allergen Reactions   Codeine Shortness Of Breath   Dextromethorphan-Guaifenesin  Shortness Of Breath and Other (See Comments)   Iron Dextran Shortness Of Breath and Other (See Comments)   Morphine  And Codeine Shortness Of Breath    Other Reaction(s): Other (See Comments)    Family History  Problem Relation Age of Onset   Hypertension Mother    Varicose Veins Mother    Diabetes Paternal Grandmother    Cancer  Paternal Grandfather    CAD Paternal Uncle     Prior to Admission medications   Medication Sig Start Date End Date Taking? Authorizing Provider  acetaminophen  (TYLENOL ) 500 MG tablet Take 1 tablet (500 mg total) by mouth every 6 (six) hours as needed. Patient taking differently: Take 500 mg by mouth every 6 (six) hours as needed for mild pain (pain score 1-3). 04/11/18  Yes Burky, Natalie B, NP  albuterol  (PROAIR  HFA) 108 (90 Base) MCG/ACT inhaler Inhale 2 puffs into the lungs every 6 (six) hours as needed for wheezing or shortness of breath. 11/30/22  Yes Rai, Ripudeep K, MD  amiodarone  (PACERONE ) 200 MG tablet Take 1 tablet (200 mg total) by mouth daily. 09/25/22  Yes Pray, Rollene BRAVO, MD  ELIQUIS  5 MG TABS tablet Take 5 mg by mouth 2 (two) times daily. 12/09/21  Yes [provider]  midodrine  (PROAMATINE ) 10 MG tablet Take 1 tablet (10 mg total) by mouth 3 (three) times daily with meals. Patient taking differently: Take 10 mg by mouth 3 (three) times daily. TAKE 30 MINUTES BEFORE HEMODIALYSIS. TAKE 1 EXTRA TABLET MID TREATMENT 09/24/22  Yes Bryan Bianchi, MD  VELPHORO  500 MG chewable tablet Chew 1,000 mg by mouth 2 (two) times daily after a meal. 02/06/21  Yes [provider]  VISINE 0.05 % ophthalmic solution Place 2-3 drops into both eyes 2 (two) times daily as needed (dry eye). 07/04/21  Yes [provider]  polyethylene glycol (MIRALAX ) 17 g packet Take 17 g by mouth daily as needed for moderate constipation or mild constipation. Also available over-the-counter Patient not taking: Reported on 01/15/2023 11/30/22   Rai, Nydia POUR, MD  senna-docusate (SENOKOT-S) 8.6-50 MG tablet Take 2 tablets by mouth at bedtime. For constipation Patient not taking: Reported on 01/15/2023 11/30/22   Rai, Nydia POUR, MD  traZODone  (DESYREL ) 50 MG tablet Take 50 mg by mouth at bedtime as needed for sleep. Patient not taking: Reported on 01/15/2023    [provider]     Physical  Exam: Vitals:   01/15/23 0400 01/15/23 0453 01/15/23 0500 01/15/23 0503  BP: 119/63     Resp: 20     Temp:  98.3 F (36.8 C)  (!) 97.4 F (36.3 C)  TempSrc:    Oral  SpO2:   100%   Weight:      Height:        Physical Exam Constitutional:      General: He is not in acute distress.    Appearance: He is ill-appearing.  Cardiovascular:     Rate and Rhythm: Regular rhythm. Bradycardia present.  Pulmonary:     Effort: Pulmonary effort is normal. No tachypnea or bradypnea.     Breath sounds: Wheezing and rales present.  Musculoskeletal:     Right lower leg: Edema present.     Left lower leg: Edema present.  Skin:    General: Skin is dry.     Capillary Refill: Capillary refill takes less than 2 seconds.  Neurological:     Mental Status: He is alert and oriented to person, place, and time.  Psychiatric:        Mood and Affect: Mood normal. Mood is not anxious.      Labs on Admission: I have personally reviewed following labs and imaging studies  CBC: Recent Labs  Lab 01/15/23 0325 01/15/23 0332  WBC 8.6  --   NEUTROABS 5.8  --   HGB 12.1* 13.6  HCT 40.2 40.0  MCV 85.2  --   PLT 85*  --    Basic Metabolic Panel: Recent Labs  Lab 01/15/23 0325 01/15/23 0332  NA 137 135  K 6.5* 6.2*  CL 96*  --   CO2 22  --   GLUCOSE 127*  --   BUN 78*  --   CREATININE 10.10*  --   CALCIUM  8.7*  --    GFR: Estimated Creatinine Clearance: 7.8  mL/min (A) (by C-G formula based on SCr of 10.1 mg/dL (H)). Liver Function Tests: Recent Labs  Lab 01/15/23 0325  AST 27  ALT 35  ALKPHOS 107  BILITOT 0.7  PROT 7.5  ALBUMIN  2.3*   No results for input(s): LIPASE, AMYLASE in the last 168 hours. No results for input(s): AMMONIA in the last 168 hours. Coagulation Profile: No results for input(s): INR, PROTIME in the last 168 hours. Cardiac Enzymes: Recent Labs  Lab 01/15/23 0325  TROPONINIHS 117*   BNP (last 3 results) Recent Labs    06/12/22 1527  12/27/22 1443 01/15/23 0325  BNP 1,479.2* 2,332.5* 3,540.1*   HbA1C: No results for input(s): HGBA1C in the last 72 hours. CBG: Recent Labs  Lab 01/15/23 0330  GLUCAP 111*   Lipid Profile: No results for input(s): CHOL, HDL, LDLCALC, TRIG, CHOLHDL, LDLDIRECT in the last 72 hours. Thyroid Function Tests: No results for input(s): TSH, T4TOTAL, FREET4, T3FREE, THYROIDAB in the last 72 hours. Anemia Panel: No results for input(s): VITAMINB12, FOLATE, FERRITIN, TIBC, IRON, RETICCTPCT in the last 72 hours. Urine analysis:    Component Value Date/Time   COLORURINE YELLOW 02/13/2010 0703   APPEARANCEUR CLOUDY (A) 02/13/2010 0703   LABSPEC 1.013 02/13/2010 0703   PHURINE 8.5 (H) 02/13/2010 0703   GLUCOSEU NEGATIVE 12/18/2009 0546   HGBUR LARGE (A) 02/13/2010 0703   BILIRUBINUR NEGATIVE 02/13/2010 0703   KETONESUR NEGATIVE 02/13/2010 0703   PROTEINUR >300 (A) 02/13/2010 0703   UROBILINOGEN 0.2 02/13/2010 0703   NITRITE NEGATIVE 02/13/2010 0703   LEUKOCYTESUR NEGATIVE 02/13/2010 0703    Radiological Exams on Admission: I have personally reviewed images DG Chest Port 1 View Result Date: 01/15/2023 CLINICAL DATA:  Dialysis patient with progressively worsening difficulty breathing. EXAM: PORTABLE CHEST 1 VIEW COMPARISON:  Portable chest and CTA chest both 12/27/2022 FINDINGS: Left IJ dialysis catheter again noted with the tip in the upper right atrium. Stable cardiomegaly. Mild central vascular congestion. Small pleural effusions. Similar findings were noted previously but today there is increased interstitial and patchy airspace disease in the left mid and both lower lung fields which could be due to pneumonia or airspace edema. There is chronic right lateral basal pleuroparenchymal disease and scarring. Tortuous, calcified aorta with stable mediastinum. There is osteopenia and mild thoracic spondylosis. No new osseous findings. IMPRESSION: 1. Increased  interstitial and patchy airspace disease in the left mid and both lower lung fields which could be due to pneumonia or airspace edema. 2. Stable cardiomegaly with mild central vascular congestion and small pleural effusions. 3. Aortic atherosclerosis. Electronically Signed   By: Francis Quam M.D.   On: 01/15/2023 03:45     EKG: My personal interpretation of EKG shows: EKG showed junctional rhythm, left bundle branch block and heart rate 51    Assessment/Plan: Principal Problem:   Acute on chronic hypoxic respiratory failure (HCC) Active Problems:   ESRD on dialysis (HCC)   COPD with acute exacerbation (HCC)   Volume overload   Elevated troponin   Acute exacerbation of CHF (congestive heart failure) (HCC)   Paroxysmal atrial fibrillation (HCC)   History of pulmonary embolism   Chronic hypotension   Non-insulin  dependent type 2 diabetes mellitus (HCC)   CAP (community acquired pneumonia)   Hyperkalemia   Chronic ITP (idiopathic thrombocytopenia) (HCC)   Acute hypoxic respiratory failure (HCC)    Assessment and Plan: Acute hypoxic respiratory failure secondary to volume overload, COPD exacerbation, CHF exacerbation and pneumonia Acute on chronic hypoxic respiratory failure 2 L  oxygen at baseline > Patient presenting to the ED via EMS with sudden onset of shortness of breath which has been progressively getting worse in last 24 hours.  EMS found patient O2 sat 80% on 2 L.  Placed on BiPAP and shortness of breath has been improved. -In the ED patient's O2 sat 100% on BiPAP. -Chest x-ray showed increased interstitial and patchy airspace disease of the both lung which represents pneumonia versus airspace edema.  Stable cardiomegaly with mild vascular congestion and small pleural effusion. -Physical exam showed patient has bilateral upper upper lung wheezing.  He is complaining about productive cough and shortness of breath.  Risk atrial for severe exacerbation - Currently treating  patient for COPD exacerbation, pneumonia and COPD exacerbation for which patient need dialysis.  Plans are following.  Volume overload ESRD on dialysis TTS schedule -Patient presenting with respiratory distress currently on BiPAP. -Chest x-ray showed cardiomegaly with central vascular congestion.  Physical exam showing bilateral lower extremity edema 1+. -Plan to continue BiPAP until patient will be dialyzed. -CMP showed elevated potassium 6.5 and elevated anion gap 19. - ED physician consulted nephrology for request for dialysis. -Patient does not make any urine anymore and there would be no benefit for IV diuretics.  Will manage volume overload with dialysis.  Hyperkalemia -Elevated potassium 6.5.  In the ED patient received insulin , dextrose  and Lokelma .  Need to do further hyperkalemia management with dialysis.  Acute exacerbation of chronic systolic heart failure History of systolic valvular reduced EF 35 to 40% -Patient presenting with shortness of breath, bilateral lower extremity pitting edema.  Chest x-ray showed cardiomegaly and pulmonary vascular congestion. - Patient is dialysis dependent.   -Nephrology has been consulted request for dialysis. -Previously called last 1 month ago which showed EF 35 to 40% and diastolic parameters are intermittent. -Continue BiPAP until dialysis. -Deferring echocardiogram as patient had echo just less than 1 month ago. - Continue cardiac monitoring  Acute exacerbation of COPD -Patient has bilateral upper lung field wheezing, cough and shortness of breath. - In the ED has been treated with Solu-Medrol  60 mg and albuterol  nebulizer. - Continue Solu-Medrol  60 mg daily for 4 days, DuoNebs scheduled and albuterol  as needed. -Continue BiPAP now and will transition to supplemental oxygen when appropriate.  Elevated troponin-secondary to demand ischemia -Elevated troponin 117.  Patient does not have any chest pressure and chest pain. - EKG showed  junctional rhythm heart rate 51 and left bundle branch block.  There is no ST-T wave abnormality. -ACS ruled out -Elevated troponin in the setting of demand ischemia in the context of acute hypoxic respiratory failure.  Deferring further troponin check. - Continue cardiac monitoring and clinically monitor for development of the chest pain.   Community-acquired pneumonia -Chest x-ray showed questionable pneumonia versus interstitial edema.  Will need to repeat chest x-ray after dialysis session for better clearance about pneumonia. - Given patient recently has been hospitalized have significant multiple comorbidities continue to treat empirically for pneumonia with IV ceftriaxone  and doxycycline . - Checking procalcitonin level, blood cultures, sputum culture and respiratory panel. -If above results are negative can discontinue the antibiotic.   Paroxysmal atrial fibrillation -EKG showed junctional rhythm, left bundle branch block and heart rate 51 -Continue amiodarone  200 mg daily and Eliquis  5 mg twice daily.  Chronic  pulmonary embolism - Continue Eliquis  5 mg twice daily  Chronic hypotension -Patient has hypotension at the baseline and he takes midodrine  10 mg 3 times daily.  Patient needs to take midodrine   30 minutes before dialysis.  History of chronic idiopathic thrombocytopenia -Platelet count 85.  Baseline around 96K to 135K.  Currently on Eliquis .  Continue to monitor.  Non-insulin -dependent DM type II-diet controlled History of diabetes diet controlled.  Continue low sliding scale and at bedtime insulin  as needed coverage as patient has been treating with IV steroid.   DVT prophylaxis:  Eliquis  Code Status:  Full Code Diet: Renal and carb modified diet with fluid restriction Family Communication:   Family was present at bedside, at the time of interview. Opportunity was given to ask question and all questions were answered satisfactorily.  Disposition Plan: Waiting for  nephrology for dialysis management for volume overload.  Pending improvement of respiratory distress. Consults: Nephrology Admission status:   Inpatient, Step Down Unit  Severity of Illness: The appropriate patient status for this patient is INPATIENT. Inpatient status is judged to be reasonable and necessary in order to provide the required intensity of service to ensure the patient's safety. The patient's presenting symptoms, physical exam findings, and initial radiographic and laboratory data in the context of their chronic comorbidities is felt to place them at high risk for further clinical deterioration. Furthermore, it is not anticipated that the patient will be medically stable for discharge from the hospital within 2 midnights of admission.   * I certify that at the point of admission it is my clinical judgment that the patient will require inpatient hospital care spanning beyond 2 midnights from the point of admission due to high intensity of service, high risk for further deterioration and high frequency of surveillance required.Nathaniel    Kristyana Notte, MD [facility]  How to contact the [facility] Attending or Consulting provider 7A - 7P or covering provider during after hours 7P -7A, for this patient.  Check the care team in [facility] and look for a) attending/consulting [facility] provider listed and b) the [facility] team listed Log into [email or web address] and use 's universal password to access. If you do not have the password, please contact the hospital operator. Locate the [facility] provider you are looking for under [facility] and page to a number that you can be directly reached. If you still have difficulty reaching the provider, please page the [facility] (Director on Call) for the Hospitalists listed on amion for assistance.  01/15/2023, 5:31 AM

## 2023-01-15 NOTE — ED Triage Notes (Signed)
 Patient coming from home. Patient is a dialysis patient tues, thurs, sat. Patient started having difficulty breathing that was getting progressively worse. Patient reports no missed dialysis. Chronically on 2L, but was sating at 81%. Rales were heard bilaterally by EMS. Placed on 7.5L on CPAP, EMS reported rales diminished. Recent respiratory illness  EMS VS 118/70

## 2023-01-15 NOTE — Procedures (Signed)
  Alert and oriented.  Informed consent signed and in chart.   TX duration: 3.5 hours  Patient tolerated well.   Alert, without acute distress.  Hand-off given to patient's nurse.   Access used: [email or web address] Access issues: none  Total UF removed: 2200 ml Medication(s) given: none   Greig Silvan, RN [facility]

## 2023-01-15 NOTE — Plan of Care (Signed)
 Patient arrived from ED on bipap. Dialysis nurse at bedside, patient able top wean off of bipap to 3L [place], tolerating well. Patient and family updated in plan of care. ICU status maintained.  Problem: Education: Goal: Ability to describe self-care measures that may prevent or decrease complications (Diabetes Survival Skills Education) will improve Outcome: Progressing Goal: Individualized Educational Video(s) Outcome: Progressing   Problem: Coping: Goal: Ability to adjust to condition or change in health will improve Outcome: Progressing   Problem: Fluid Volume: Goal: Ability to maintain a balanced intake and output will improve Outcome: Progressing   Problem: Health Behavior/Discharge Planning: Goal: Ability to identify and utilize available resources and services will improve Outcome: Progressing Goal: Ability to manage health-related needs will improve Outcome: Progressing   Problem: Metabolic: Goal: Ability to maintain appropriate glucose levels will improve Outcome: Progressing   Problem: Nutritional: Goal: Maintenance of adequate nutrition will improve Outcome: Progressing Goal: Progress toward achieving an optimal weight will improve Outcome: Progressing   Problem: Skin Integrity: Goal: Risk for impaired skin integrity will decrease Outcome: Progressing   Problem: Tissue Perfusion: Goal: Adequacy of tissue perfusion will improve Outcome: Progressing   Problem: Education: Goal: Ability to demonstrate management of disease process will improve Outcome: Progressing Goal: Ability to verbalize understanding of medication therapies will improve Outcome: Progressing Goal: Individualized Educational Video(s) Outcome: Progressing   Problem: Activity: Goal: Capacity to carry out activities will improve Outcome: Progressing   Problem: Cardiac: Goal: Ability to achieve and maintain adequate cardiopulmonary perfusion will improve Outcome: Progressing   Problem:  Education: Goal: Knowledge of disease or condition will improve Outcome: Progressing Goal: Knowledge of the prescribed therapeutic regimen will improve Outcome: Progressing Goal: Individualized Educational Video(s) Outcome: Progressing   Problem: Activity: Goal: Ability to tolerate increased activity will improve Outcome: Progressing Goal: Will verbalize the importance of balancing activity with adequate rest periods Outcome: Progressing   Problem: Respiratory: Goal: Ability to maintain a clear airway will improve Outcome: Progressing Goal: Levels of oxygenation will improve Outcome: Progressing Goal: Ability to maintain adequate ventilation will improve Outcome: Progressing   Problem: Activity: Goal: Ability to tolerate increased activity will improve Outcome: Progressing   Problem: Clinical Measurements: Goal: Ability to maintain a body temperature in the normal range will improve Outcome: Progressing   Problem: Respiratory: Goal: Ability to maintain adequate ventilation will improve Outcome: Progressing Goal: Ability to maintain a clear airway will improve Outcome: Progressing   Problem: Education: Goal: Knowledge of General Education information will improve Description: Including pain rating scale, medication(s)/side effects and non-pharmacologic comfort measures Outcome: Progressing   Problem: Health Behavior/Discharge Planning: Goal: Ability to manage health-related needs will improve Outcome: Progressing   Problem: Clinical Measurements: Goal: Ability to maintain clinical measurements within normal limits will improve Outcome: Progressing Goal: Will remain free from infection Outcome: Progressing Goal: Diagnostic test results will improve Outcome: Progressing Goal: Respiratory complications will improve Outcome: Progressing Goal: Cardiovascular complication will be avoided Outcome: Progressing   Problem: Activity: Goal: Risk for activity intolerance  will decrease Outcome: Progressing   Problem: Nutrition: Goal: Adequate nutrition will be maintained Outcome: Progressing   Problem: Coping: Goal: Level of anxiety will decrease Outcome: Progressing   Problem: Elimination: Goal: Will not experience complications related to bowel motility Outcome: Progressing Goal: Will not experience complications related to urinary retention Outcome: Progressing   Problem: Pain Management: Goal: General experience of comfort will improve Outcome: Progressing   Problem: Safety: Goal: Ability to remain free from injury will improve Outcome: Progressing   Problem: Skin  Integrity: Goal: Risk for impaired skin integrity will decrease Outcome: Progressing

## 2023-01-15 NOTE — Progress Notes (Signed)
 Arterial pressure increasing causing machine to alarm, switched lines with no success, flushed both ports with 10 ml of NS and switched lines back. AP has decreased.

## 2023-01-15 NOTE — Progress Notes (Signed)
 Heart Failure Navigator Progress Note  Assessed for Heart & Vascular TOC clinic readiness.  Patient does not meet criteria due to ESRD on Hemodialysis.   Navigator will sign off at this time.   Rhae Hammock, BSN, Scientist, clinical (histocompatibility and immunogenetics) Only

## 2023-01-15 NOTE — Consult Note (Signed)
 Renal Service Consult Note [facility]  Nathaniel White 01/15/2023 Nathaniel JONETTA Fret, MD Requesting Physician: Dr. Austria  Reason for Consult: ESRD pt w/ resp distress HPI: The patient is a 63 y.o. year-old w/ PMH as below who presented to ED early this am w/ resp distress. On HD TTS, has not missed. Is on 2L [place] O2 at home. Pt had recent resp illness. In ED pt placed on bipap. BP 110/70, BNP 3541, Hb 12, K+ 6.5.  CXR bilat mod- severe pulm edema. Pt is admitted. We are asked to see for dialysis.   Pt seen in ED room. Not providing any history, tachypneic on bipap. Family at bedside. Has not missed HD but has hx of eating and drinking anything he wants per family. Also may be losing body wt because some days he only eats one meal.   ROS - denies CP, no joint pain, no HA, no blurry vision, no rash, no diarrhea, no nausea/ vomiting, no dysuria, no difficulty voiding   Past Medical History  Past Medical History:  Diagnosis Date   Anemia    Anxiety    Asthma    Complication from renal dialysis device 11/02/2013   Complication of anesthesia 2017   according to pt and spouse pt was moving around and cough while under and pt had difficulty waking up so the anesthesia had to be reversed. and pt admitted to ICU.    COPD (chronic obstructive pulmonary disease) (HCC)    Diabetes mellitus without complication (HCC)    Type II - Patient states he does not have diabetes, they said sometimes when you start dialysis you don't have diabetes any longer    ESOPHAGEAL VARICES 10/04/2008   Qualifier: Diagnosis of  By: Aneita MD NOLIA [name]    ESRD    on HD, T-TH-Sat - [place]   Hemiparesis due to old stroke [facility])    left   HFrEF (heart failure with reduced ejection fraction) (HCC)    NICM // TTE 06/2022: EF 25-30, sever RV dysfunction, mod to severe MR   Hypertension    Hx, not current problems, no meds   LV dysfunction    EF 25-30% by echo 07/2011   Memory loss due to medical  condition    due to stroke   MR (mitral regurgitation)    moderate to severe, echo 07/2011   Nausea with vomiting, unspecified 06/15/2021   Paroxysmal atrial fibrillation (HCC)    Peripheral vascular disease (HCC)    Shortness of breath    with exertion   Stroke (HCC)    TIA's-left sided weakness   Tobacco abuse    Past Surgical History  Past Surgical History:  Procedure Laterality Date   ABDOMINAL AORTOGRAM W/LOWER EXTREMITY Bilateral 07/21/2018   Procedure: ABDOMINAL AORTOGRAM W/LOWER EXTREMITY;  Surgeon: Sheree Penne Bruckner, MD;  Location: [facility] INVASIVE CV LAB;  Service: Cardiovascular;  Laterality: Bilateral;   AMPUTATION Left 11/05/2018   Procedure: AMPUTATION SECOND TOE LEFT FOOT;  Surgeon: Sheree Penne Bruckner, MD;  Location: [facility] OR;  Service: Vascular;  Laterality: Left;   AMPUTATION Left 02/13/2021   Procedure: Left small finger AMPUTATION DIGIT;  Surgeon: Romona Harari, MD;  Location: [facility] OR;  Service: Orthopedics;  Laterality: Left;   AV FISTULA PLACEMENT     CARDIAC CATHETERIZATION      medical   COLONOSCOPY W/ BIOPSIES AND POLYPECTOMY     FISTULA SUPERFICIALIZATION Left 11/10/2013   Procedure: FISTULA PLICATION;  Surgeon: Gaile LELON New, MD;  Location: [facility] OR;  Service: Vascular;  Laterality: Left;   INSERTION OF DIALYSIS CATHETER N/A 06/20/2021   Procedure: INSERTION OF DIALYSIS CATHETER;  Surgeon: Lanis Fonda BRAVO, MD;  Location: [facility] OR;  Service: Vascular;  Laterality: N/A;   KIDNEY TRANSPLANT     2011 rejected kidney 2012 back on dialysis   LEFT AND RIGHT HEART CATHETERIZATION WITH CORONARY ANGIOGRAM N/A 10/09/2013   Procedure: LEFT AND RIGHT HEART CATHETERIZATION WITH CORONARY ANGIOGRAM;  Surgeon: Lonni JONETTA Cash, MD;  Location: [facility] CATH LAB;  Service: Cardiovascular;  Laterality: N/A;   LIGATION OF ARTERIOVENOUS  FISTULA Left 06/20/2021   Procedure: LIGATION OF ARTERIOVENOUS  FISTULA;  Surgeon: Lanis Fonda BRAVO, MD;  Location: [facility] OR;  Service:  Vascular;  Laterality: Left;   PERIPHERAL VASCULAR INTERVENTION Left 07/21/2018   Procedure: PERIPHERAL VASCULAR INTERVENTION;  Surgeon: Sheree Penne Lonni, MD;  Location: [facility] INVASIVE CV LAB;  Service: Cardiovascular;  Laterality: Left;  Popliteal   REVISON OF ARTERIOVENOUS FISTULA Left 08/26/2013   Procedure: EXCISION OF ERODED SKIN AND EXPLORATION OF MAIN LEFT UPPER ARM AV FISTULA;  Surgeon: Krystal JULIANNA Doing, MD;  Location: [facility] OR;  Service: Vascular;  Laterality: Left;   REVISON OF ARTERIOVENOUS FISTULA Left 02/05/2014   Procedure: REPAIR OF ARTERIOVENOUS FISTULA ANEURYSM;  Surgeon: Gaile LELON New, MD;  Location: [facility] OR;  Service: Vascular;  Laterality: Left;   REVISON OF ARTERIOVENOUS FISTULA Left 03/10/2021   Procedure: LEFT ARM REVISION OF ARTERIOVENOUS FISTULA WITH PLICATION;  Surgeon: Sheree Penne Lonni, MD;  Location: [facility] OR;  Service: Vascular;  Laterality: Left;   RIGHT/LEFT HEART CATH AND CORONARY ANGIOGRAPHY N/A 06/21/2022   Procedure: RIGHT/LEFT HEART CATH AND CORONARY ANGIOGRAPHY;  Surgeon: Burnard Debby LABOR, MD;  Location: [facility] INVASIVE CV LAB;  Service: Cardiovascular;  Laterality: N/A;   SCROTAL EXPLORATION N/A 09/16/2022   Procedure: PENILE DEBRIDMENT;  Surgeon: Devere Lonni Righter, MD;  Location: [facility] OR;  Service: Urology;  Laterality: N/A;   SHUNTOGRAM N/A 11/05/2012   Procedure: Fistulogram;  Surgeon: Gaile LELON New, MD;  Location: [facility] CATH LAB;  Service: Cardiovascular;  Laterality: N/A;   TEE WITHOUT CARDIOVERSION  08/17/2011   Procedure: TRANSESOPHAGEAL ECHOCARDIOGRAM (TEE);  Surgeon: Ezra GORMAN Shuck, MD;  Location: [facility] ENDOSCOPY;  Service: Cardiovascular;  Laterality: N/A;   ULTRASOUND GUIDANCE FOR VASCULAR ACCESS  06/20/2021   Procedure: ULTRASOUND GUIDANCE FOR VASCULAR ACCESS;  Surgeon: Lanis Fonda BRAVO, MD;  Location: [facility] OR;  Service: Vascular;;   VIDEO ASSISTED THORACOSCOPY (VATS)/DECORTICATION  08/10/11   Family History  Family History  Problem Relation Age of Onset    Hypertension Mother    Varicose Veins Mother    Diabetes Paternal Grandmother    Cancer Paternal Grandfather    CAD Paternal Uncle    Social History  reports that he quit smoking about 11 years ago. His smoking use included cigarettes. He started smoking about 26 years ago. He has a 7.5 pack-year smoking history. He has never used smokeless tobacco. He reports that he does not drink alcohol  and does not use drugs. Allergies  Allergies  Allergen Reactions   Codeine Shortness Of Breath   Dextromethorphan-Guaifenesin  Shortness Of Breath and Other (See Comments)   Iron Dextran Shortness Of Breath and Other (See Comments)   Morphine  And Codeine Shortness Of Breath    Other Reaction(s): Other (See Comments)   Home medications Prior to Admission medications   Medication Sig Start Date End Date Taking? Authorizing Provider  acetaminophen  (TYLENOL ) 500 MG tablet Take 1 tablet (500 mg  total) by mouth every 6 (six) hours as needed. Patient taking differently: Take 500 mg by mouth every 6 (six) hours as needed for mild pain (pain score 1-3). 04/11/18  Yes Burky, Natalie B, NP  albuterol  (PROAIR  HFA) 108 (90 Base) MCG/ACT inhaler Inhale 2 puffs into the lungs every 6 (six) hours as needed for wheezing or shortness of breath. 11/30/22  Yes Rai, Ripudeep K, MD  amiodarone  (PACERONE ) 200 MG tablet Take 1 tablet (200 mg total) by mouth daily. 09/25/22  Yes Pray, Rollene BRAVO, MD  ELIQUIS  5 MG TABS tablet Take 5 mg by mouth 2 (two) times daily. 12/09/21  Yes [provider]  midodrine  (PROAMATINE ) 10 MG tablet Take 1 tablet (10 mg total) by mouth 3 (three) times daily with meals. Patient taking differently: Take 10 mg by mouth 3 (three) times daily. TAKE 30 MINUTES BEFORE HEMODIALYSIS. TAKE 1 EXTRA TABLET MID TREATMENT 09/24/22  Yes Bryan Bianchi, MD  VELPHORO  500 MG chewable tablet Chew 1,000 mg by mouth 2 (two) times daily after a meal. 02/06/21  Yes [provider]  VISINE 0.05 % ophthalmic  solution Place 2-3 drops into both eyes 2 (two) times daily as needed (dry eye). 07/04/21  Yes [provider]  polyethylene glycol (MIRALAX ) 17 g packet Take 17 g by mouth daily as needed for moderate constipation or mild constipation. Also available over-the-counter Patient not taking: Reported on 01/15/2023 11/30/22   Rai, Nydia POUR, MD  senna-docusate (SENOKOT-S) 8.6-50 MG tablet Take 2 tablets by mouth at bedtime. For constipation Patient not taking: Reported on 01/15/2023 11/30/22   Rai, Nydia POUR, MD  traZODone  (DESYREL ) 50 MG tablet Take 50 mg by mouth at bedtime as needed for sleep. Patient not taking: Reported on 01/15/2023    [provider]     Vitals:   01/15/23 0700 01/15/23 0715 01/15/23 0734 01/15/23 0745  BP: 138/80   124/73  Pulse:      Resp: (!) 23   (!) 22  Temp:      TempSrc:      SpO2:  90% 92%   Weight:      Height:       Exam Gen on bipap, lethargic, ^RR No rash, cyanosis or gangrene Sclera anicteric, throat clear  No jvd or bruits Chest bilat basilar crackles, no wheezing RRR no MRG Abd soft ntnd no mass or ascites +bs GU defer MS no joint effusions or deformity Ext no LE or UE edema, no wounds or ulcers Neuro is alert, Ox 3 , nf    LIJ TDC intact     Renal-related home meds: - midodrine  10 tid - eliquis  5 bid - velphoro  1 gm ac  - others: trazodone , amiodarone , nebs prn    OP HD: SW TTS  4h   400/500   2.2 bath  dry wt pend   LIJ TDC  Heparin  none -  pend   Assessment/ Plan: Acute on chronic hypoxic resp failure - has not missed HD but has hx of not following fluid restriction per family. Last HD Sat. Placed on bipap in ED but remains tachypneic w/ dellar. Not stable for upstairs HD, would recommend CCM consult, have d/w pmd.  ESRD - on HD TTS. Has not missed any HD.  BP - taking midodrine  10 tid at home. BP's wnl here.  Volume - sig LE edema and pulm edema. Will need vol removal w/ dialysis.  Anemia of esrd - Hb 12-13,  no esa needs.  Secondary hyperparathyroidism - CCa in range. Follow.   7.   COPD 8.  H/o CVA's 9.  Chronic syst CHF - EF 25-30%       Nathaniel Fret  MD [facility] 01/15/2023, 8:36 AM  Recent Labs  Lab 01/15/23 0325 01/15/23 0332 01/15/23 0524 01/15/23 0534  HGB 12.1*   < > 12.3* 14.3  ALBUMIN  2.3*  --  2.3*  --   CALCIUM  8.7*  --  8.7*  --   CREATININE 10.10*  --  10.09*  --   K 6.5*   < > 6.1* 5.9*   < > = values in this interval not displayed.   Inpatient medications:  amiodarone   200 mg Oral Daily   apixaban   5 mg Oral BID   Chlorhexidine  Gluconate Cloth  6 each Topical Q0600   insulin  aspart  0-5 Units Subcutaneous QHS   insulin  aspart  0-6 Units Subcutaneous TID WC   ipratropium-albuterol   3 mL Nebulization Q6H   [START ON 01/16/2023] methylPREDNISolone  (SOLU-MEDROL ) injection  60 mg Intravenous Daily   midodrine   10 mg Oral TID WC   sodium chloride  flush  3 mL Intravenous Q12H   sodium chloride  flush  3 mL Intravenous Q12H   sucroferric oxyhydroxide  1,000 mg Oral BID PC    sodium chloride      anticoagulant sodium citrate      cefTRIAXone  (ROCEPHIN )  IV 2 g (01/15/23 0754)   doxycycline  (VIBRAMYCIN ) IV     sodium chloride , acetaminophen  **OR** acetaminophen , albuterol , alteplase , anticoagulant sodium citrate , heparin , lidocaine  (PF), lidocaine -prilocaine , pentafluoroprop-tetrafluoroeth, sodium chloride  flush

## 2023-01-15 NOTE — ED Provider Notes (Signed)
 [place] EMERGENCY DEPARTMENT AT [facility] Provider Note   CSN: [id number] Arrival date & time: 01/15/23  0315     History  Chief Complaint  Patient presents with   Shortness of Breath    Nathaniel White is a 63 y.o. male.   Shortness of Breath Patient presents for shortness of breath.  Medical history includes atrial fibrillation, CHF, HTN, ESRD, PE, COPD, anemia, anxiety, CVA.  He undergoes dialysis on T, TH, SA.  Last dialysis session was on Saturday.  Patient developed worsening shortness of breath throughout the past 24 hours.  He is on 2 L of supplemental oxygen at baseline.  When EMS arrived on scene, patient was in respiratory distress with SpO2 in the 80s on his home 2 L.  He was placed on CPAP.  On arrival, patient reports improved symptoms.     Home Medications Prior to Admission medications   Medication Sig Start Date End Date Taking? Authorizing Provider  acetaminophen  (TYLENOL ) 500 MG tablet Take 1 tablet (500 mg total) by mouth every 6 (six) hours as needed. Patient taking differently: Take 500 mg by mouth every 6 (six) hours as needed for mild pain (pain score 1-3). 04/11/18  Yes Burky, Natalie B, NP  albuterol  (PROAIR  HFA) 108 (90 Base) MCG/ACT inhaler Inhale 2 puffs into the lungs every 6 (six) hours as needed for wheezing or shortness of breath. 11/30/22  Yes Rai, Ripudeep K, MD  amiodarone  (PACERONE ) 200 MG tablet Take 1 tablet (200 mg total) by mouth daily. 09/25/22  Yes Pray, Rollene BRAVO, MD  ELIQUIS  5 MG TABS tablet Take 5 mg by mouth 2 (two) times daily. 12/09/21  Yes [provider]  midodrine  (PROAMATINE ) 10 MG tablet Take 1 tablet (10 mg total) by mouth 3 (three) times daily with meals. Patient taking differently: Take 10 mg by mouth 3 (three) times daily. TAKE 30 MINUTES BEFORE HEMODIALYSIS. TAKE 1 EXTRA TABLET MID TREATMENT 09/24/22  Yes Bryan Bianchi, MD  VELPHORO  500 MG chewable tablet Chew 1,000 mg by mouth 2 (two) times daily after a  meal. 02/06/21  Yes [provider]  VISINE 0.05 % ophthalmic solution Place 2-3 drops into both eyes 2 (two) times daily as needed (dry eye). 07/04/21  Yes [provider]  polyethylene glycol (MIRALAX ) 17 g packet Take 17 g by mouth daily as needed for moderate constipation or mild constipation. Also available over-the-counter Patient not taking: Reported on 01/15/2023 11/30/22   Rai, Nydia POUR, MD  senna-docusate (SENOKOT-S) 8.6-50 MG tablet Take 2 tablets by mouth at bedtime. For constipation Patient not taking: Reported on 01/15/2023 11/30/22   Rai, Nydia POUR, MD  traZODone  (DESYREL ) 50 MG tablet Take 50 mg by mouth at bedtime as needed for sleep. Patient not taking: Reported on 01/15/2023    [provider]      Allergies    Codeine, Dextromethorphan-guaifenesin , Iron dextran, and Morphine  and codeine    Review of Systems   Review of Systems  Respiratory:  Positive for chest tightness and shortness of breath.   All other systems reviewed and are negative.   Physical Exam Updated Vital Signs BP 114/67   Pulse (!) 53   Temp (!) 97.4 F (36.3 C) (Oral)   Resp (!) 24   Ht 5' 9 (1.753 m)   Wt 74.8 kg   SpO2 100%   BMI 24.37 kg/m  Physical Exam Vitals and nursing note reviewed.  Constitutional:      General: He is  not in acute distress.    Appearance: He is well-developed. He is ill-appearing (Chronically). He is not toxic-appearing or diaphoretic.  HENT:     Head: Normocephalic and atraumatic.     Mouth/Throat:     Mouth: Mucous membranes are moist.  Eyes:     Conjunctiva/sclera: Conjunctivae normal.  Cardiovascular:     Rate and Rhythm: Normal rate and regular rhythm.     Heart sounds: No murmur heard. Pulmonary:     Effort: Pulmonary effort is normal. Tachypnea present. No respiratory distress.     Breath sounds: Decreased breath sounds, wheezing and rales present.  Chest:     Chest wall: No tenderness.  Abdominal:     Palpations: Abdomen is  soft.     Tenderness: There is no abdominal tenderness.  Musculoskeletal:        General: No swelling. Normal range of motion.     Cervical back: Normal range of motion and neck supple.  Skin:    General: Skin is warm and dry.     Coloration: Skin is not cyanotic or pale.  Neurological:     General: No focal deficit present.     Mental Status: He is alert and oriented to person, place, and time.  Psychiatric:        Mood and Affect: Mood normal.        Behavior: Behavior normal.     ED Results / Procedures / Treatments   Labs (all labs ordered are listed, but only abnormal results are displayed) Labs Reviewed  COMPREHENSIVE METABOLIC PANEL - Abnormal; Notable for the following components:      Result Value   Potassium 6.5 (*)    Chloride 96 (*)    Glucose, Bld 127 (*)    BUN 78 (*)    Creatinine, Ser 10.10 (*)    Calcium  8.7 (*)    Albumin  2.3 (*)    GFR, Estimated 5 (*)    Anion gap 19 (*)    All other components within normal limits  CBC WITH DIFFERENTIAL/PLATELET - Abnormal; Notable for the following components:   Hemoglobin 12.1 (*)    MCH 25.6 (*)    RDW 18.6 (*)    Platelets 85 (*)    All other components within normal limits  BRAIN NATRIURETIC PEPTIDE - Abnormal; Notable for the following components:   B Natriuretic Peptide 3,540.1 (*)    All other components within normal limits  COMPREHENSIVE METABOLIC PANEL - Abnormal; Notable for the following components:   Potassium 6.1 (*)    Chloride 96 (*)    Glucose, Bld 131 (*)    BUN 79 (*)    Creatinine, Ser 10.09 (*)    Calcium  8.7 (*)    Albumin  2.3 (*)    GFR, Estimated 5 (*)    Anion gap 19 (*)    All other components within normal limits  CBC - Abnormal; Notable for the following components:   Hemoglobin 12.3 (*)    MCH 25.4 (*)    MCHC 29.9 (*)    RDW 18.6 (*)    Platelets 95 (*)    All other components within normal limits  CBG MONITORING, ED - Abnormal; Notable for the following components:    Glucose-Capillary 111 (*)    All other components within normal limits  I-STAT VENOUS BLOOD GAS, ED - Abnormal; Notable for the following components:   pO2, Ven 29 (*)    Potassium 6.2 (*)    Calcium , Ion 0.94 (*)  All other components within normal limits  I-STAT VENOUS BLOOD GAS, ED - Abnormal; Notable for the following components:   pO2, Ven 30 (*)    Bicarbonate 28.4 (*)    Potassium 5.9 (*)    Calcium , Ion 0.99 (*)    All other components within normal limits  CBG MONITORING, ED - Abnormal; Notable for the following components:   Glucose-Capillary 121 (*)    All other components within normal limits  TROPONIN I (HIGH SENSITIVITY) - Abnormal; Notable for the following components:   Troponin I (High Sensitivity) 117 (*)    All other components within normal limits  RESP PANEL BY RT-PCR (RSV, FLU A&B, COVID)  RVPGX2  CULTURE, BLOOD (ROUTINE X 2)  CULTURE, BLOOD (ROUTINE X 2)  RESPIRATORY PANEL BY PCR  PROCALCITONIN  HEPATITIS B SURFACE ANTIGEN  HEPATITIS B SURFACE ANTIBODY, QUANTITATIVE    EKG EKG Interpretation Date/Time:  Tuesday January 15 2023 03:37:00 EST Ventricular Rate:  51 PR Interval:    QRS Duration:  204 QT Interval:  530 QTC Calculation: 489 R Axis:   -32  Text Interpretation: Junctional rhythm Left bundle branch block Confirmed by Melvenia Motto [phone]) on 01/15/2023 3:56:39 AM  Radiology DG Chest Port 1 View Result Date: 01/15/2023 CLINICAL DATA:  Dialysis patient with progressively worsening difficulty breathing. EXAM: PORTABLE CHEST 1 VIEW COMPARISON:  Portable chest and CTA chest both 12/27/2022 FINDINGS: Left IJ dialysis catheter again noted with the tip in the upper right atrium. Stable cardiomegaly. Mild central vascular congestion. Small pleural effusions. Similar findings were noted previously but today there is increased interstitial and patchy airspace disease in the left mid and both lower lung fields which could be due to pneumonia or airspace edema.  There is chronic right lateral basal pleuroparenchymal disease and scarring. Tortuous, calcified aorta with stable mediastinum. There is osteopenia and mild thoracic spondylosis. No new osseous findings. IMPRESSION: 1. Increased interstitial and patchy airspace disease in the left mid and both lower lung fields which could be due to pneumonia or airspace edema. 2. Stable cardiomegaly with mild central vascular congestion and small pleural effusions. 3. Aortic atherosclerosis. Electronically Signed   By: Francis Quam M.D.   On: 01/15/2023 03:45    Procedures Procedures    Medications Ordered in ED Medications  amiodarone  (PACERONE ) tablet 200 mg (has no administration in time range)  midodrine  (PROAMATINE ) tablet 10 mg (has no administration in time range)  sucroferric oxyhydroxide (VELPHORO ) chewable tablet 1,000 mg (has no administration in time range)  apixaban  (ELIQUIS ) tablet 5 mg (has no administration in time range)  sodium chloride  flush (NS) 0.9 % injection 3 mL (has no administration in time range)  sodium chloride  flush (NS) 0.9 % injection 3 mL (has no administration in time range)  sodium chloride  flush (NS) 0.9 % injection 3 mL (has no administration in time range)  0.9 %  sodium chloride  infusion (has no administration in time range)  acetaminophen  (TYLENOL ) tablet 650 mg (has no administration in time range)    Or  acetaminophen  (TYLENOL ) suppository 650 mg (has no administration in time range)  ipratropium-albuterol  (DUONEB) 0.5-2.5 (3) MG/3ML nebulizer solution 3 mL (has no administration in time range)  methylPREDNISolone  sodium succinate  (SOLU-MEDROL ) 125 mg/2 mL injection 60 mg (has no administration in time range)  doxycycline  (VIBRAMYCIN ) 100 mg in dextrose  5 % 250 mL IVPB (has no administration in time range)  insulin  aspart (novoLOG ) injection 0-6 Units (has no administration in time range)  insulin  aspart (novoLOG ) injection  0-5 Units (has no administration in time  range)  cefTRIAXone  (ROCEPHIN ) 2 g in sodium chloride  0.9 % 100 mL IVPB (has no administration in time range)  albuterol  (PROVENTIL ) (2.5 MG/3ML) 0.083% nebulizer solution 2.5 mg (has no administration in time range)  Chlorhexidine  Gluconate Cloth 2 % PADS 6 each (has no administration in time range)  pentafluoroprop-tetrafluoroeth (GEBAUERS) aerosol 1 Application (has no administration in time range)  lidocaine  (PF) (XYLOCAINE ) 1 % injection 5 mL (has no administration in time range)  lidocaine -prilocaine  (EMLA ) cream 1 Application (has no administration in time range)  heparin  injection 1,000 Units (has no administration in time range)  anticoagulant sodium citrate  solution 5 mL (has no administration in time range)  alteplase  (CATHFLO ACTIVASE ) injection 2 mg (has no administration in time range)  LORazepam  (ATIVAN ) injection 0.5 mg (has no administration in time range)  nitroGLYCERIN  (NITROGLYN) 2 % ointment 1 inch (has no administration in time range)  albuterol  (PROVENTIL ) (2.5 MG/3ML) 0.083% nebulizer solution 2.5 mg (2.5 mg Nebulization Given 01/15/23 0356)  methylPREDNISolone  sodium succinate  (SOLU-MEDROL ) 125 mg/2 mL injection 60 mg (60 mg Intravenous Given 01/15/23 0353)  sodium zirconium cyclosilicate  (LOKELMA ) packet 10 g (10 g Oral Given 01/15/23 0600)  insulin  aspart (novoLOG ) injection 5 Units (5 Units Intravenous Given 01/15/23 0546)    And  dextrose  50 % solution 50 mL (50 mLs Intravenous Given 01/15/23 0549)  calcium  gluconate 1 g/ 50 mL sodium chloride  IVPB (1,000 mg Intravenous New Bag/Given 01/15/23 0557)    ED Course/ Medical Decision Making/ A&P                                 Medical Decision Making Amount and/or Complexity of Data Reviewed Labs: ordered. Radiology: ordered.  Risk Prescription drug management. Decision regarding hospitalization.   This patient presents to the ED for concern of shortness of breath, this involves an extensive number of treatment options,  and is a complaint that carries with it a high risk of complications and morbidity.  The differential diagnosis includes CHF exacerbation, pulmonary edema, COPD exacerbation, pneumonia, pleural effusion, ACS anemia, metabolic derangements   Co morbidities that complicate the patient evaluation  atrial fibrillation, CHF, HTN, ESRD, PE, COPD, anemia, anxiety, CVA   Additional history obtained:  Additional history obtained from EMS External records from outside source obtained and reviewed including EMR   Lab Tests:  I Ordered, and personally interpreted labs.  The pertinent results include: Hyperkalemia is present.  Elevated creatinine and azotemia consistent with ESRD.  No leukocytosis is present.  Blood gas shows slight respiratory acidosis.  BNP is markedly elevated.  Troponin appears consistent with prior lab work.   Imaging Studies ordered:  I ordered imaging studies including chest x-ray I independently visualized and interpreted imaging which showed patchy airspace disease consistent with pulmonary edema and/or pneumonia I agree with the radiologist interpretation   Cardiac Monitoring: / EKG:  The patient was maintained on a cardiac monitor.  I personally viewed and interpreted the cardiac monitored which showed an underlying rhythm of: Sinus rhythm   Consultations Obtained:  I requested consultation with the nephrologist, Dr. Norine,  and discussed lab and imaging findings as well as pertinent plan - they recommend: Will arrange dialysis   Problem List / ED Course / Critical interventions / Medication management  Patient presenting for worsening shortness of breath throughout the day.  He arrives on CPAP with EMS.  EMS reported SpO2  of 81% on his home 2 L on scene with associated respiratory distress.  On arrival, patient was taken off of CPAP.  He is currently able to speak in complete sentences.  He does report some improved symptoms following CPAP.  No other  interventions were given prior to arrival.  On lung auscultation, patient does have diminished breath sounds and wheezing.  I suspect that there is a component of COPD to his symptoms.  Solu-Medrol  and albuterol  were ordered.  On bedside ultrasound, patient has biapical B-lines.  He is currently anuric.  He will require dialysis for what appears to be some pulmonary edema.  Workup was initiated.  Initial lab work shows baseline anemia, no leukocytosis, and markedly elevated BNP.  Chest x-ray shows findings consistent with pulmonary edema and/or bibasilar pneumonia.  Antibiotics were ordered.  Further lab work shows hyperkalemia.  Temporizing medications were ordered.  I spoke with nephrologist on-call, Dr. Norine, who will arrange dialysis.  Patient was admitted for further management. I ordered medication including Solu-Medrol  and albuterol  for COPD; insulin /dextrose , Lokelma , albuterol  for hyperkalemia Reevaluation of the patient after these medicines showed that the patient improved I have reviewed the patients home medicines and have made adjustments as needed   Social Determinants of Health:  Has access to outpatient care   CRITICAL CARE Performed by: Bernardino Fireman   Total critical care time: 34 minutes  Critical care time was exclusive of separately billable procedures and treating other patients.  Critical care was necessary to treat or prevent imminent or life-threatening deterioration.  Critical care was time spent personally by me on the following activities: development of treatment plan with patient and/or surrogate as well as nursing, discussions with consultants, evaluation of patient's response to treatment, examination of patient, obtaining history from patient or surrogate, ordering and performing treatments and interventions, ordering and review of laboratory studies, ordering and review of radiographic studies, pulse oximetry and re-evaluation of patient's  condition.         Final Clinical Impression(s) / ED Diagnoses Final diagnoses:  Acute on chronic respiratory failure with hypoxia and hypercapnia (HCC)  COPD exacerbation (HCC)  Acute pulmonary edema (HCC)  Hyperkalemia    Rx / DC Orders ED Discharge Orders     None         Fireman Bernardino, MD 01/15/23 [phone]

## 2023-01-15 NOTE — ED Notes (Signed)
 Report was given to [place] at 10:15 am.

## 2023-01-15 NOTE — ED Notes (Signed)
 Sundil, Subrina, MD came down to see the patient. MD stated she would like for him to be on the BiPAP until dialysis but he can stay on the nasal cannula. The patient chose to try the nasal cannula. After approximately 10 mintues, patient's family member came out to say the patient is not breathing well to [employer]. Primary nurse, Janeth RN, was caring for another patient at the time. Primary nurse assessed patient and determined he needs to go back on BiPAP due to his increased work of breathing and oxygen saturation in the low 80s. This nurse contacted RT while staying with the patient. RT was able to place patient back on BiPAP and patient's oxygen saturation rose to 100.

## 2023-01-15 NOTE — Progress Notes (Signed)
 Report given to dialysis nurse Amy. Patient and family updated in plan of care.

## 2023-01-16 ENCOUNTER — Inpatient Hospital Stay (HOSPITAL_COMMUNITY): Payer: Medicare Other

## 2023-01-16 DIAGNOSIS — J9621 Acute and chronic respiratory failure with hypoxia: Secondary | ICD-10-CM | POA: Diagnosis not present

## 2023-01-16 LAB — CBC
HCT: 34.9 % — ABNORMAL LOW (ref 39.0–52.0)
Hemoglobin: 10.6 g/dL — ABNORMAL LOW (ref 13.0–17.0)
MCH: 25.7 pg — ABNORMAL LOW (ref 26.0–34.0)
MCHC: 30.4 g/dL (ref 30.0–36.0)
MCV: 84.5 fL (ref 80.0–100.0)
Platelets: 87 10*3/uL — ABNORMAL LOW (ref 150–400)
RBC: 4.13 MIL/uL — ABNORMAL LOW (ref 4.22–5.81)
RDW: 18.7 % — ABNORMAL HIGH (ref 11.5–15.5)
WBC: 7 10*3/uL (ref 4.0–10.5)
nRBC: 0 % (ref 0.0–0.2)

## 2023-01-16 LAB — HEPATITIS B SURFACE ANTIBODY, QUANTITATIVE: Hep B S AB Quant (Post): 453 m[IU]/mL

## 2023-01-16 LAB — BASIC METABOLIC PANEL
Anion gap: 18 — ABNORMAL HIGH (ref 5–15)
BUN: 50 mg/dL — ABNORMAL HIGH (ref 6–20)
CO2: 23 mmol/L (ref 22–32)
Calcium: 8.6 mg/dL — ABNORMAL LOW (ref 8.9–10.3)
Chloride: 93 mmol/L — ABNORMAL LOW (ref 98–111)
Creatinine, Ser: 7.13 mg/dL — ABNORMAL HIGH (ref 0.61–1.24)
GFR, Estimated: 8 mL/min — ABNORMAL LOW (ref >60)
Glucose, Bld: 110 mg/dL — ABNORMAL HIGH (ref 70–99)
Potassium: 5.1 mmol/L (ref 3.5–5.1)
Sodium: 134 mmol/L — ABNORMAL LOW (ref 135–145)

## 2023-01-16 LAB — GLUCOSE, CAPILLARY
Glucose-Capillary: 102 mg/dL — ABNORMAL HIGH (ref 70–99)
Glucose-Capillary: 107 mg/dL — ABNORMAL HIGH (ref 70–99)
Glucose-Capillary: 269 mg/dL — ABNORMAL HIGH (ref 70–99)
Glucose-Capillary: 94 mg/dL (ref 70–99)
Glucose-Capillary: 84 mg/dL (ref 70–99)
Glucose-Capillary: 105 mg/dL — ABNORMAL HIGH (ref 70–99)

## 2023-01-16 LAB — PROCALCITONIN: Procalcitonin: 1.85 ng/mL

## 2023-01-16 MED ORDER — IPRATROPIUM-ALBUTEROL 0.5-2.5 (3) MG/3ML IN SOLN: 3.0000 mL | Freq: Two times a day (BID) | RESPIRATORY_TRACT | Status: DC

## 2023-01-16 MED ORDER — PREDNISONE 20 MG PO TABS
40.0000 mg | ORAL_TABLET | Freq: Every day | ORAL | Status: AC
  Administered 2023-01-17 – 2023-01-19 (×3): 40 mg via ORAL
  Filled 2023-01-16 (×3): qty 2

## 2023-01-16 MED ORDER — ALBUTEROL SULFATE (2.5 MG/3ML) 0.083% IN NEBU
2.5000 mg | INHALATION_SOLUTION | RESPIRATORY_TRACT | Status: DC | PRN
  Administered 2023-01-17: 2.5 mg via RESPIRATORY_TRACT
  Filled 2023-01-16: qty 3

## 2023-01-16 MED ORDER — IPRATROPIUM-ALBUTEROL 0.5-2.5 (3) MG/3ML IN SOLN
3.0000 mL | RESPIRATORY_TRACT | Status: DC
  Administered 2023-01-16 (×2): 3 mL via RESPIRATORY_TRACT
  Filled 2023-01-16: qty 3

## 2023-01-16 MED ORDER — DOXERCALCIFEROL 4 MCG/2ML IV SOLN
2.0000 ug | INTRAVENOUS | Status: DC
  Administered 2023-01-19: 2 ug via INTRAVENOUS
  Filled 2023-01-16 (×3): qty 2

## 2023-01-16 MED ORDER — AMOXICILLIN-POT CLAVULANATE 500-125 MG PO TABS
1.0000 | ORAL_TABLET | Freq: Every day | ORAL | Status: DC
  Administered 2023-01-17 – 2023-01-18 (×2): 1 via ORAL
  Filled 2023-01-16 (×2): qty 1

## 2023-01-16 MED ORDER — PROSOURCE PLUS PO LIQD
30.0000 mL | Freq: Two times a day (BID) | ORAL | Status: DC
  Administered 2023-01-16 – 2023-01-18 (×2): 30 mL via ORAL
  Filled 2023-01-16 (×3): qty 30

## 2023-01-16 NOTE — Progress Notes (Signed)
 Patient had no complaints of pain during shift. Satting well on 2L [place]. Patient had one BM during shift. PT and OT at bedside to assess patient, see note. Patient and family updated in plan of care. Floor status pending bed availability.

## 2023-01-16 NOTE — Progress Notes (Addendum)
 PROGRESS NOTE    Nathaniel White  FMW:[id number] DOB: 07-03-1960 DOA: 01/15/2023 PCP: Theotis Haze ORN, NP    Brief Narrative:   Nathaniel White is a 63 y.o. male with past medical history significant for chronic hypoxic respiratory failure/COPD on 2 L nasal cannula at baseline, ESRD on HD TTS, paroxysmal atrial fibrillation on Eliquis , chronic systolic congestive heart failure, cirrhosis, anemia of chronic medical/renal disease, history of PE, history of CVA, diet-controlled type 2 diabetes mellitus, who presented to [facility] ED on 01/15/2023 via EMS from home with progressive shortness of breath.  Family reports compliance with hemodialysis.  On EMS arrival, patient was oxygenating 81% on 2 L noted, and placed on CPAP, with improvement of symptoms and transported to ED for further evaluation management.  In the ED, temperature 98.3 F, HR 51, RR 23, BP 119/63, SpO2 100% on BiPAP with FiO2 60%.  VBG with pH 7.369, pCO2 47.2, pCO2 29.  WBC 8.6, hemoglobin 12.1, platelet count 85.  Sodium 137, potassium 6.5, chloride 96, CO2 22, glucose 127, BUN 78, creatinine 10.10.  Calcium  8.7, AST 27, ALT 35, total bilirubin 0.7.  BNP 3540.1.  High sensitive troponin 117.  Procalcitonin 1.30.  COVID-19/RSV/influenza PCR negative.  Respiratory viral panel negative.  Chest x-ray with increased interstitial/patchy airspace disease left mid and bilateral lower lung fields, stable cardiomegaly with central vascular congestion and small pleural effusions. ED physician reported that with the concern for pneumonia and COPD exacerbation patient has been treated with albuterol  nebulizer and broad-spectrum antibiotic has been started.  For hyperkalemia patient has been shifted.  For volume overload and acute exacerbation of CHF ED physician Dr. Melvenia has been consulted nephrology and waiting for response.   Hospitalist has been contacted for management of acute hypoxic respiratory failure in the context of COPD exacerbation, CHF  exacerbation, volume overload, pneumonia and hyperkalemia.  Significant Hospital events: 1/7: Admit to [facility], nephrology consulted.  Per Dr. Geralynn, too unstable for HD on the floor requesting transfer to ICU, PCCM consulted.  Underwent HD and BiPAP removed. 1/8: Transferred to [facility], now on 5 L nasal cannula.  Assessment & Plan:   Acute on chronic hypoxic respiratory failure, POA Patient presenting to the ED with progressive shortness of breath.  Etiology likely multifactorial in the setting of dietary indiscretions, volume overload, systolic CHF exacerbation, community-acquired pneumonia as well as COPD exacerbation.  Patient initially requiring BiPAP, underwent hemodialysis and placed on empiric antibiotics with Zosyn , IV steroids for COPD exacerbation with improvement of symptoms and BiPAP weaned off back to nasal cannula. -- Continue supplemental oxygen, maintain SpO2 greater than 88% -- Continue treatment as below  Community-acquired pneumonia Chest x-ray with interstitial/patchy airspace disease left mid and bilateral lower lung fields concerning for pneumonia.  Patient is afebrile without leukocytosis but procalcitonin elevated. -- Continue Zosyn , will de-escalate to Augmentin  tomorrow to complete 5-day antibiotic course  Acute on chronic systolic congestive heart failure Chest x-ray with pulmonary edema/central vascular congestion at time of admission with elevated BNP.  Etiology likely secondary to dietary indiscretions with noncompliance with amounts of fluid intake. -- Underwent hemodialysis 1/7 with 2.2 L UF -- Renal diet with 1.2 L fluid restriction daily -- Continue volume management with HD; planned next HD tomorrow -- Strict I's and O's and daily weights.  Hyperkalemia: Potassium 6.5 on admission, treated and underwent hemodialysis yesterday, repeat labs pending today. -- Renal function panel daily  ESRD on HD TTS -- Nephrology following, appreciate assistance -- Midodrine   10 mg p.o.  3 times daily  Acute COPD exacerbation -- Solu-Medrol  40 mg IV every 24 hours, transition to prednisone  40 mg p.o. daily tomorrow -- Albuterol  neb every 4 hours as needed for wheezing/shortness of breath -- Goal SpO2 >88%  Paroxysmal atrial fibrillation on anticoagulation -- Amiodarone  200 mg p.o. daily -- Eliquis  5 mg p.o. twice daily  History of PE --Continue Eliquis   Type 2 diabetes mellitus Controlled at baseline, hemoglobin A1c 4.8 11/2022, well-controlled. -- SSI for coverage -- CBG before every meal/at bedtime  Weakness/debility/deconditioning: -- PT/OT evaluation: Pending    DVT prophylaxis: SCDs Start: 01/15/23 0904 SCDs Start: 01/15/23 0452 apixaban  (ELIQUIS ) tablet 5 mg    Code Status: Full Code Family Communication: No family present bedside this morning  Disposition Plan:  Level of care: Telemetry Medical Status is: Inpatient Remains inpatient appropriate because: IV antibiotics, IV steroids, needs further HD while inpatient, needs further titration down of supplemental oxygen to his baseline 2 L nasal cannula    Consultants:  PCCM: Signed off 01/16/2023 Nephrology  Procedures:  None  Antimicrobials:  Zosyn  1/7>> Doxycycline  1/7 - 1/7 Ceftriaxone  1/7 - 1/7   Subjective: Patient seen examined bedside, resting comfortable.  Lying in bed.  Remains off of BiPAP, on 5 L nasal cannula.  Reports breathing much improved.  Asking for something to eat.  No other specific complaints, questions or concerns at this time.  Denies headache, no dizziness, no chest pain, no palpitations, no abdominal pain, no fever/chills/night sweats, no nausea/vomiting/diarrhea, no focal weakness, no fatigue, no paresthesias.  No acute events overnight per nursing staff.  Objective: Vitals:   01/16/23 0900 01/16/23 1000 01/16/23 1105 01/16/23 1147  BP: 135/79 101/61 131/71   Pulse: 61 66 69 67  Resp: 16 (!) 24 (!) 21 18  Temp:      TempSrc:      SpO2: 93% 90% 94% 95%   Weight:      Height:        Intake/Output Summary (Last 24 hours) at 01/16/2023 1204 Last data filed at 01/16/2023 1000 Gross per 24 hour  Intake 475 ml  Output 2200 ml  Net -1725 ml   Filed Weights   01/15/23 1520 01/15/23 1937 01/16/23 0500  Weight: 74.8 kg 72.6 kg 70 kg    Examination:  Physical Exam: GEN: NAD, alert and oriented x 3, chronically ill appearance, appears older than stated age HEENT: NCAT, PERRL, EOMI, sclera clear, MMM PULM: Breath sounds slight diminished bilateral bases, mild crackles, no wheezing, normal respiratory effort without accessory muscle use, on 5 L nasal cannula with SpO2 96% at baseline. CV: RRR w/o M/G/R GI: abd soft, NTND, NABS, no R/G/M MSK: no peripheral edema, moves all extremities independently NEURO: CN II-XII intact, no focal deficits, sensation to light touch intact PSYCH: normal mood/affect Integumentary: No concerning rashes/lesions/wounds noted on exposed skin surfaces.    Data Reviewed: I have personally reviewed following labs and imaging studies  CBC: Recent Labs  Lab 01/15/23 0325 01/15/23 0332 01/15/23 0524 01/15/23 0534 01/15/23 0923  WBC 8.6  --  7.9  --   --   NEUTROABS 5.8  --   --   --   --   HGB 12.1* 13.6 12.3* 14.3 12.2*  HCT 40.2 40.0 41.1 42.0 36.0*  MCV 85.2  --  84.9  --   --   PLT 85*  --  95*  --   --    Basic Metabolic Panel: Recent Labs  Lab 01/15/23 0325 01/15/23 0332 01/15/23 0516  01/15/23 0524 01/15/23 0534 01/15/23 0923  NA 137 135  --  138 136 134*  K 6.5* 6.2*  --  6.1* 5.9* 6.1*  CL 96*  --   --  96*  --   --   CO2 22  --   --  23  --   --   GLUCOSE 127*  --   --  131*  --   --   BUN 78*  --   --  79*  --   --   CREATININE 10.10*  --   --  10.09*  --   --   CALCIUM  8.7*  --   --  8.7*  --   --   PHOS  --   --  11.2*  --   --   --    GFR: Estimated Creatinine Clearance: 7.7 mL/min (A) (by C-G formula based on SCr of 10.09 mg/dL (H)). Liver Function Tests: Recent Labs  Lab  01/15/23 0325 01/15/23 0524  AST 27 26  ALT 35 36  ALKPHOS 107 112  BILITOT 0.7 0.8  PROT 7.5 7.8  ALBUMIN  2.3* 2.3*   No results for input(s): LIPASE, AMYLASE in the last 168 hours. No results for input(s): AMMONIA in the last 168 hours. Coagulation Profile: No results for input(s): INR, PROTIME in the last 168 hours. Cardiac Enzymes: No results for input(s): CKTOTAL, CKMB, CKMBINDEX, TROPONINI in the last 168 hours. BNP (last 3 results) No results for input(s): PROBNP in the last 8760 hours. HbA1C: No results for input(s): HGBA1C in the last 72 hours. CBG: Recent Labs  Lab 01/15/23 1926 01/15/23 2322 01/16/23 0319 01/16/23 0801 01/16/23 1123  GLUCAP 142* 127* 102* 107* 269*   Lipid Profile: No results for input(s): CHOL, HDL, LDLCALC, TRIG, CHOLHDL, LDLDIRECT in the last 72 hours. Thyroid Function Tests: No results for input(s): TSH, T4TOTAL, FREET4, T3FREE, THYROIDAB in the last 72 hours. Anemia Panel: No results for input(s): VITAMINB12, FOLATE, FERRITIN, TIBC, IRON, RETICCTPCT in the last 72 hours. Sepsis Labs: Recent Labs  Lab 01/15/23 0524  PROCALCITON 1.30    Recent Results (from the past 240 hours)  Resp panel by RT-PCR (RSV, Flu A&B, Covid) Anterior Nasal Swab     Status: None   Collection Time: 01/15/23  3:25 AM   Specimen: Anterior Nasal Swab  Result Value Ref Range Status   SARS Coronavirus 2 by RT PCR NEGATIVE NEGATIVE Final   Influenza A by PCR NEGATIVE NEGATIVE Final   Influenza B by PCR NEGATIVE NEGATIVE Final    Comment: (NOTE) The Xpert Xpress SARS-CoV-2/FLU/RSV plus assay is intended as an aid in the diagnosis of influenza from Nasopharyngeal swab specimens and should not be used as a sole basis for treatment. Nasal washings and aspirates are unacceptable for Xpert Xpress SARS-CoV-2/FLU/RSV testing.  Fact Sheet for Patients: bloggercourse.com  Fact Sheet for  Healthcare Providers: seriousbroker.it  This test is not yet approved or cleared by the [place]  FDA and has been authorized for detection and/or diagnosis of SARS-CoV-2 by FDA under an Emergency Use Authorization (EUA). This EUA will remain in effect (meaning this test can be used) for the duration of the COVID-19 declaration under Section 564(b)(1) of the Act, 21 U.S.C. section 360bbb-3(b)(1), unless the authorization is terminated or revoked.     Resp Syncytial Virus by PCR NEGATIVE NEGATIVE Final    Comment: (NOTE) Fact Sheet for Patients: bloggercourse.com  Fact Sheet for Healthcare Providers: seriousbroker.it  This test is not yet approved or  cleared by the [place]  FDA and has been authorized for detection and/or diagnosis of SARS-CoV-2 by FDA under an Emergency Use Authorization (EUA). This EUA will remain in effect (meaning this test can be used) for the duration of the COVID-19 declaration under Section 564(b)(1) of the Act, 21 U.S.C. section 360bbb-3(b)(1), unless the authorization is terminated or revoked.  Performed at [facility], [address], [place], [place] [address]   Respiratory (~20 pathogens) panel by PCR     Status: None   Collection Time: 01/15/23  3:25 AM   Specimen: Nasopharyngeal Swab; Respiratory  Result Value Ref Range Status   Adenovirus NOT DETECTED NOT DETECTED Final   Coronavirus 229E NOT DETECTED NOT DETECTED Final    Comment: (NOTE) The Coronavirus on the Respiratory Panel, DOES NOT test for the novel  Coronavirus (2019 nCoV)    Coronavirus HKU1 NOT DETECTED NOT DETECTED Final   Coronavirus NL63 NOT DETECTED NOT DETECTED Final   Coronavirus OC43 NOT DETECTED NOT DETECTED Final   Metapneumovirus NOT DETECTED NOT DETECTED Final   Rhinovirus / Enterovirus NOT DETECTED NOT DETECTED Final   Influenza A NOT DETECTED NOT DETECTED Final   Influenza B  NOT DETECTED NOT DETECTED Final   Parainfluenza Virus 1 NOT DETECTED NOT DETECTED Final   Parainfluenza Virus 2 NOT DETECTED NOT DETECTED Final   Parainfluenza Virus 3 NOT DETECTED NOT DETECTED Final   Parainfluenza Virus 4 NOT DETECTED NOT DETECTED Final   Respiratory Syncytial Virus NOT DETECTED NOT DETECTED Final   Bordetella pertussis NOT DETECTED NOT DETECTED Final   Bordetella Parapertussis NOT DETECTED NOT DETECTED Final   Chlamydophila pneumoniae NOT DETECTED NOT DETECTED Final   Mycoplasma pneumoniae NOT DETECTED NOT DETECTED Final    Comment: Performed at [facility], [address], [place], [place] [address]  Culture, blood (routine x 2) Call MD if unable to obtain prior to antibiotics being given     Status: None (Preliminary result)   Collection Time: 01/15/23  5:16 AM   Specimen: BLOOD RIGHT ARM  Result Value Ref Range Status   Specimen Description BLOOD RIGHT ARM  Final   Special Requests   Final    BOTTLES DRAWN AEROBIC AND ANAEROBIC Blood Culture results may not be optimal due to an inadequate volume of blood received in culture bottles   Culture   Final    NO GROWTH 1 DAY Performed at [facility], [address] [place], [place] [address]    Report Status PENDING  Incomplete  Culture, blood (routine x 2) Call MD if unable to obtain prior to antibiotics being given     Status: None (Preliminary result)   Collection Time: 01/15/23  5:24 AM   Specimen: BLOOD RIGHT ARM  Result Value Ref Range Status   Specimen Description BLOOD RIGHT ARM  Final   Special Requests   Final    BOTTLES DRAWN AEROBIC AND ANAEROBIC Blood Culture adequate volume   Culture   Final    NO GROWTH 1 DAY Performed at [facility], [address] [place], [place] [address]    Report Status PENDING  Incomplete  MRSA Next Gen by PCR, Nasal     Status: None   Collection Time: 01/15/23 10:32 AM   Specimen: Nasal Mucosa; Nasal Swab  Result Value Ref Range Status   MRSA by PCR  Next Gen NOT DETECTED NOT DETECTED Final    Comment: (NOTE) The GeneXpert MRSA Assay (FDA approved for  NASAL specimens only), is one component of a comprehensive MRSA colonization surveillance program. It is not intended to diagnose MRSA infection nor to guide or monitor treatment for MRSA infections. Test performance is not FDA approved in patients less than 83 years old. Performed at [facility], [address] [place], [place] [address]   Culture, blood (Routine X 2) w Reflex to ID Panel     Status: None (Preliminary result)   Collection Time: 01/15/23 11:00 AM   Specimen: BLOOD RIGHT ARM  Result Value Ref Range Status   Specimen Description BLOOD RIGHT ARM  Final   Special Requests   Final    BOTTLES DRAWN AEROBIC AND ANAEROBIC Blood Culture results may not be optimal due to an inadequate volume of blood received in culture bottles   Culture   Final    NO GROWTH < 24 HOURS Performed at [facility], [address], [place], [place] [address]    Report Status PENDING  Incomplete  Culture, blood (Routine X 2) w Reflex to ID Panel     Status: None (Preliminary result)   Collection Time: 01/15/23 11:14 AM   Specimen: BLOOD RIGHT ARM  Result Value Ref Range Status   Specimen Description BLOOD RIGHT ARM  Final   Special Requests   Final    BOTTLES DRAWN AEROBIC AND ANAEROBIC Blood Culture results may not be optimal due to an inadequate volume of blood received in culture bottles   Culture   Final    NO GROWTH < 24 HOURS Performed at [facility], [address], [place], [place] [address]    Report Status PENDING  Incomplete         Radiology Studies: DG CHEST PORT 1 VIEW Result Date: 01/16/2023 CLINICAL DATA:  Pneumonia. Dialysis patient with difficulty breathing. EXAM: PORTABLE CHEST 1 VIEW COMPARISON:  Chest CT dated 01/15/2023. FINDINGS: Dialysis catheter in similar position. Slight interval improvement in bilateral interstitial and airspace opacities  compared to prior radiograph. No pneumothorax. Stable mild cardiomegaly. Atherosclerotic calcification of the aorta. No acute osseous pathology. IMPRESSION: Slight interval improvement in bilateral interstitial and airspace opacities. Electronically Signed   By: Vanetta Chou M.D.   On: 01/16/2023 09:34   DG Chest Port 1 View Result Date: 01/15/2023 CLINICAL DATA:  Dialysis patient with progressively worsening difficulty breathing. EXAM: PORTABLE CHEST 1 VIEW COMPARISON:  Portable chest and CTA chest both 12/27/2022 FINDINGS: Left IJ dialysis catheter again noted with the tip in the upper right atrium. Stable cardiomegaly. Mild central vascular congestion. Small pleural effusions. Similar findings were noted previously but today there is increased interstitial and patchy airspace disease in the left mid and both lower lung fields which could be due to pneumonia or airspace edema. There is chronic right lateral basal pleuroparenchymal disease and scarring. Tortuous, calcified aorta with stable mediastinum. There is osteopenia and mild thoracic spondylosis. No new osseous findings. IMPRESSION: 1. Increased interstitial and patchy airspace disease in the left mid and both lower lung fields which could be due to pneumonia or airspace edema. 2. Stable cardiomegaly with mild central vascular congestion and small pleural effusions. 3. Aortic atherosclerosis. Electronically Signed   By: Francis Quam M.D.   On: 01/15/2023 03:45        Scheduled Meds:  amiodarone   200 mg Oral Daily   apixaban   5 mg Oral BID   Chlorhexidine  Gluconate Cloth  6 each Topical Q0600   insulin  aspart  0-5 Units Subcutaneous QHS  insulin  aspart  0-6 Units Subcutaneous TID WC   ipratropium-albuterol   3 mL Nebulization Q4H   methylPREDNISolone  (SOLU-MEDROL ) injection  40 mg Intravenous Daily   midodrine   10 mg Oral TID WC   sucroferric oxyhydroxide  1,000 mg Oral BID PC   Continuous Infusions:  anticoagulant sodium citrate       piperacillin -tazobactam (ZOSYN )  IV Stopped (01/16/23 0634)     LOS: 1 day    Time spent: 52 minutes spent on chart review, discussion with nursing staff, consultants, updating family and interview/physical exam; more than 50% of that time was spent in counseling and/or coordination of care.    Nathaniel PARAS Tacy Chavis, DO [facility] Available via Epic secure chat 7am-7pm After these hours, please refer to coverage provider listed on [email or web address] 01/16/2023, 12:04 PM

## 2023-01-16 NOTE — Progress Notes (Signed)
 [facility] Progress Note   Subjective:   Seen in room - breathing improved after HD yesterday, still on nasal O2. Denies CP, abd pain. K was high pre-HD yesterday, awaiting repeat.   Objective Vitals:   01/16/23 0900 01/16/23 1000 01/16/23 1105 01/16/23 1147  BP: 135/79 101/61 131/71   Pulse: 61 66 69 67  Resp: 16 (!) 24 (!) 21 18  Temp:      TempSrc:      SpO2: 93% 90% 94% 95%  Weight:      Height:       Physical Exam General: Chronically ill appearing man, NAD. nasal O2 in place Heart: RRR; no murmur Lungs: Bibasilar rales/rhonchi present Abdomen: distended, btu soft Extremities: No LE edema Dialysis Access: [facility]  Additional Objective Labs: Basic Metabolic Panel: Recent Labs  Lab 01/15/23 0325 01/15/23 0332 01/15/23 0516 01/15/23 0524 01/15/23 0534 01/15/23 0923  NA 137   < >  --  138 136 134*  K 6.5*   < >  --  6.1* 5.9* 6.1*  CL 96*  --   --  96*  --   --   CO2 22  --   --  23  --   --   GLUCOSE 127*  --   --  131*  --   --   BUN 78*  --   --  79*  --   --   CREATININE 10.10*  --   --  10.09*  --   --   CALCIUM  8.7*  --   --  8.7*  --   --   PHOS  --   --  11.2*  --   --   --    < > = values in this interval not displayed.   Liver Function Tests: Recent Labs  Lab 01/15/23 0325 01/15/23 0524  AST 27 26  ALT 35 36  ALKPHOS 107 112  BILITOT 0.7 0.8  PROT 7.5 7.8  ALBUMIN  2.3* 2.3*   CBC: Recent Labs  Lab 01/15/23 0325 01/15/23 0332 01/15/23 0524 01/15/23 0534 01/15/23 0923  WBC 8.6  --  7.9  --   --   NEUTROABS 5.8  --   --   --   --   HGB 12.1*   < > 12.3* 14.3 12.2*  HCT 40.2   < > 41.1 42.0 36.0*  MCV 85.2  --  84.9  --   --   PLT 85*  --  95*  --   --    < > = values in this interval not displayed.   Studies/Results: DG CHEST PORT 1 VIEW Result Date: 01/16/2023 CLINICAL DATA:  Pneumonia. Dialysis patient with difficulty breathing. EXAM: PORTABLE CHEST 1 VIEW COMPARISON:  Chest CT dated 01/15/2023. FINDINGS: Dialysis  catheter in similar position. Slight interval improvement in bilateral interstitial and airspace opacities compared to prior radiograph. No pneumothorax. Stable mild cardiomegaly. Atherosclerotic calcification of the aorta. No acute osseous pathology. IMPRESSION: Slight interval improvement in bilateral interstitial and airspace opacities. Electronically Signed   By: Vanetta Chou M.D.   On: 01/16/2023 09:34   DG Chest Port 1 View Result Date: 01/15/2023 CLINICAL DATA:  Dialysis patient with progressively worsening difficulty breathing. EXAM: PORTABLE CHEST 1 VIEW COMPARISON:  Portable chest and CTA chest both 12/27/2022 FINDINGS: Left IJ dialysis catheter again noted with the tip in the upper right atrium. Stable cardiomegaly. Mild central vascular congestion. Small pleural effusions. Similar findings were noted previously but today there  is increased interstitial and patchy airspace disease in the left mid and both lower lung fields which could be due to pneumonia or airspace edema. There is chronic right lateral basal pleuroparenchymal disease and scarring. Tortuous, calcified aorta with stable mediastinum. There is osteopenia and mild thoracic spondylosis. No new osseous findings. IMPRESSION: 1. Increased interstitial and patchy airspace disease in the left mid and both lower lung fields which could be due to pneumonia or airspace edema. 2. Stable cardiomegaly with mild central vascular congestion and small pleural effusions. 3. Aortic atherosclerosis. Electronically Signed   By: Francis Quam M.D.   On: 01/15/2023 03:45   Medications:  anticoagulant sodium citrate      piperacillin -tazobactam (ZOSYN )  IV Stopped (01/16/23 9365)    amiodarone   200 mg Oral Daily   apixaban   5 mg Oral BID   Chlorhexidine  Gluconate Cloth  6 each Topical Q0600   insulin  aspart  0-5 Units Subcutaneous QHS   insulin  aspart  0-6 Units Subcutaneous TID WC   ipratropium-albuterol   3 mL Nebulization Q4H    methylPREDNISolone  (SOLU-MEDROL ) injection  40 mg Intravenous Daily   midodrine   10 mg Oral TID WC   sucroferric oxyhydroxide  1,000 mg Oral BID PC    Dialysis Orders TTS - AF 4:15hr, 400/500, EDW 67.5kg, 2K/2Ca, TDC, no heparin  - Mircera 50mcg Iv q 2 weeks (last 12/26) - Hectoral 2mcg Iv q HD - Parsabiv 7.5mg  IV q HD  Assessment/Plan: AHRF/pulm edema + COPD exacerbation + presumed pneumonia: Required BiPAP on admit, now weaned to [place] O2. On Zosyn  + steroids. Improved s/p HD yesterday. ESRD: Usual TTS schedule. Would love to offer him an extra HD today, but our inpatient HD census is pretty high - unlikely able to make happen. Next HD tomorrow. HypoTN/volume: BP stable on mido TID, still with pulm edema -> max UF tomorrow. Per patient, cramps severely with >3L UF goal. Anemia of ESRD: Hgb 12.2 - no ESA needed at this time Secondary HPTH: Ca ok, Phos very high - continue Velphoro  as binder, restart Hectoral. Nutrition: Alb low, adding supplements.   Izetta Boehringer, PA-C 01/16/2023, 11:48 AM  [employer]

## 2023-01-16 NOTE — Evaluation (Signed)
 Physical Therapy Evaluation Patient Details Name: Nathaniel White MRN: [id number] DOB: [birth year] Today's Date: 01/16/2023  History of Present Illness  63 yo male admitted with respiratory distress  PMH anemia, anxiety COPD ESRD (TTS) HTN, CVA L hemiparesis, memory loss, MR , PVD, CHF, asthma, HFrEF tobacco abuse  Clinical Impression  Pt admitted with/for respiratory distress.  Pt likely not quite at baseline function, needing min to moderate assist for basic bed mobility and transfers OOB..  Pt currently limited functionally due to the problems listed below.  (see problems list.)  Pt will benefit from PT to maximize function and safety to be able to get home safely with available assist.         If plan is discharge home, recommend the following: A lot of help with walking and/or transfers;A lot of help with bathing/dressing/bathroom;Assistance with cooking/housework;Assist for transportation;Help with stairs or ramp for entrance   Can travel by private vehicle        Equipment Recommendations Other (comment) (TBD)  Recommendations for Other Services       Functional Status Assessment Patient has had a recent decline in their functional status and demonstrates the ability to make significant improvements in function in a reasonable and predictable amount of time.     Precautions / Restrictions Precautions Precautions: Fall Precaution Comments: oxygen Restrictions Weight Bearing Restrictions Per Provider Order: No      Mobility  Bed Mobility Overal bed mobility: Needs Assistance Bed Mobility: Supine to Sit, Sit to Supine     Supine to sit: Min assist Sit to supine: Min assist   General bed mobility comments: pt struggled with L LE in/out needin min to mod assist, min stability and R to lL assist to get up and to the Left  with the [facility] raised.    Transfers Overall transfer level: Needs assistance Equipment used: None Transfers: Bed to chair/wheelchair/BSC   Stand  pivot transfers: Mod assist (x2, heavier mod assist from lower chair back to bed to the left.)        Lateral/Scoot Transfers: Contact guard assist (4 scoots up toward [facility] with R UE assist)      Ambulation/Gait               General Gait Details: Not able  Stairs            Wheelchair Mobility     Tilt Bed    Modified Rankin (Stroke Patients Only)       Balance Overall balance assessment: Needs assistance Sitting-balance support: No upper extremity supported, Feet supported Sitting balance-Leahy Scale: Fair                                       Pertinent Vitals/Pain      Home Living Family/patient expects to be discharged to:: Private residence Living Arrangements: Other relatives (4 people) Available Help at Discharge: Friend(s);Available 24 hours/day Type of Home: House Home Access: Stairs to enter   Entrance Stairs-Number of Steps: 2 Alternate Level Stairs-Number of Steps: flight Home Layout: Two level;Able to live on main level with bedroom/bathroom Home Equipment: Rollator (4 wheels);Cane - single point;BSC/3in1;Shower seat;Wheelchair - manual;Other (comment) (Oxygen without o2) Additional Comments: no animals    Prior Function Prior Level of Function : Needs assist       Physical Assist : Mobility (physical);ADLs (physical) Mobility (physical): Bed mobility;Transfers   Mobility Comments: Pt reports his friends  pick him up and put him in the w/c and take him wherever he needs to go ADLs Comments: assist with bathing and dressing     Extremity/Trunk Assessment   Upper Extremity Assessment Upper Extremity Assessment: Defer to OT evaluation (L hemiparesis)    Lower Extremity Assessment Lower Extremity Assessment: Generalized weakness;LLE deficits/detail LLE Deficits / Details: ~-20 deg terminal knee extension at rest or for transfers, paretic LLE Coordination: decreased fine motor;decreased gross motor    Cervical /  Trunk Assessment Cervical / Trunk Assessment: Normal  Communication   Communication Communication: Difficulty communicating thoughts/reduced clarity of speech  Cognition Arousal: Alert Behavior During Therapy: WFL for tasks assessed/performed Overall Cognitive Status: Within Functional Limits for tasks assessed                                          General Comments General comments (skin integrity, edema, etc.): During session O2 removed for 3-4 min with drop in SpO2 to 84-87%, mid 90's on 2L O2    Exercises     Assessment/Plan    PT Assessment Patient needs continued PT services  PT Problem List Decreased strength;Decreased range of motion;Decreased activity tolerance;Decreased balance;Decreased mobility;Decreased coordination       PT Treatment Interventions Functional mobility training;Therapeutic activities;Therapeutic exercise;Balance training;Neuromuscular re-education;Patient/family education    PT Goals (Current goals can be found in the Care Plan section)  Acute Rehab PT Goals Patient Stated Goal: Get back home Potential to Achieve Goals: Good    Frequency Min 1X/week     Co-evaluation PT/OT/SLP Co-Evaluation/Treatment: Yes Reason for Co-Treatment: Necessary to address cognition/behavior during functional activity;Complexity of the patient's impairments (multi-system involvement);For patient/therapist safety;To address functional/ADL transfers PT goals addressed during session: Mobility/safety with mobility OT goals addressed during session: ADL's and self-care;Proper use of Adaptive equipment and DME;Strengthening/ROM       AM-PAC PT 6 Clicks Mobility  Outcome Measure Help needed turning from your back to your side while in a flat bed without using bedrails?: A Little Help needed moving from lying on your back to sitting on the side of a flat bed without using bedrails?: A Little Help needed moving to and from a bed to a chair (including  a wheelchair)?: A Lot Help needed standing up from a chair using your arms (e.g., wheelchair or bedside chair)?: Total Help needed to walk in hospital room?: Total Help needed climbing 3-5 steps with a railing? : Total 6 Click Score: 11    End of Session Equipment Utilized During Treatment: Oxygen Activity Tolerance: Patient tolerated treatment well Patient left: in bed;with call bell/phone within reach Nurse Communication: Mobility status PT Visit Diagnosis: Other abnormalities of gait and mobility (R26.89)    Time: 8599-8567 PT Time Calculation (min) (ACUTE ONLY): 32 min   Charges:   PT Evaluation $PT Eval Moderate Complexity: 1 Mod   PT General Charges $$ ACUTE PT VISIT: 1 Visit         01/16/2023  India HERO., PT Acute Rehabilitation Services [phone]  (office)  Vinie GAILS Seaver Machia 01/16/2023, 3:00 PM

## 2023-01-16 NOTE — Evaluation (Signed)
 Occupational Therapy Evaluation Patient Details Name: Nathaniel White MRN: [id number] DOB: [birth year] Today's Date: 01/16/2023   History of Present Illness 63 yo male admitted with respiratory distress  PMH anemia, anxiety COPD ESRD (TTS) HTN, CVA L hemiparesis, memory loss, MR , PVD, CHF, asthma, HFrEF tobacco abuse   Clinical Impression   PT admitted with respiratory distress. Pt currently with functional limitiations due to the deficits listed below (see OT problem list). Pt reports feeling near baseline. No family or caregiver present to confirm. OT to follow acutely for activity tolerance.  Pt will benefit from skilled OT to increase their independence and safety with adls and balance to allow discharge HHOT / caregiver aide .        If plan is discharge home, recommend the following: A lot of help with bathing/dressing/bathroom    Functional Status Assessment  Patient has had a recent decline in their functional status and demonstrates the ability to make significant improvements in function in a reasonable and predictable amount of time.  Equipment Recommendations  None recommended by OT    Recommendations for Other Services       Precautions / Restrictions Precautions Precautions: Fall Precaution Comments: oxygen Restrictions Weight Bearing Restrictions Per Provider Order: No      Mobility Bed Mobility Overal bed mobility: Needs Assistance Bed Mobility: Supine to Sit, Sit to Supine     Supine to sit: Min assist Sit to supine: Min assist   General bed mobility comments: pt struggled with L LE in/out needin min to mod assist, min stability and R to lL assist to get up and to the Left  with the [facility] raised.    Transfers Overall transfer level: Needs assistance Equipment used: None Transfers: Bed to chair/wheelchair/BSC   Stand pivot transfers: Mod assist (x2, heavier mod assist from lower chair back to bed to the left.)        Lateral/Scoot Transfers: Contact  guard assist (4 scoots up toward [facility] with R UE assist)        Balance Overall balance assessment: Needs assistance Sitting-balance support: No upper extremity supported, Feet supported Sitting balance-Leahy Scale: Fair                                     ADL either performed or assessed with clinical judgement   ADL Overall ADL's : Needs assistance/impaired Eating/Feeding: Set up Eating/Feeding Details (indicate cue type and reason): pt noted to have spilled liquid in the bed                 Lower Body Dressing: Maximal assistance   Toilet Transfer: Maximal assistance             General ADL Comments: pt reports baseline requires (A) for bathing dressing and transfers. pt is transfered into the chair by Luke or [employer] bit     Vision Ability to See in Adequate Light: 1 Impaired Patient Visual Report: Blurring of vision Vision Assessment?: Vision impaired- to be further tested in functional context Additional Comments: pt reports vision changes occcuring since prior admission. pt reports that he needs further follow up for visual changes. pt states vision is blurry     Perception         Praxis         Pertinent Vitals/Pain Pain Assessment Pain Assessment: No/denies pain     Extremity/Trunk Assessment Upper Extremity Assessment Upper Extremity Assessment:  Left hand dominant;LUE deficits/detail LUE Deficits / Details: contractures noted with AROM elbow flexion and limited elbow extension to less than 90 degrees.contractures of MCP 4th and 5th digit ( amputation of 5 th digit). pt with contracture of 3rd digit in extension. pt does not use splint at baseline. LUE Coordination: decreased fine motor;decreased gross motor   Lower Extremity Assessment Lower Extremity Assessment: Defer to PT evaluation LLE Deficits / Details: ~-20 deg terminal knee extension at rest or for transfers, paretic LLE Coordination: decreased fine motor;decreased gross  motor   Cervical / Trunk Assessment Cervical / Trunk Assessment: Normal   Communication Communication Communication: Difficulty communicating thoughts/reduced clarity of speech   Cognition Arousal: Alert Behavior During Therapy: WFL for tasks assessed/performed Overall Cognitive Status: Within Functional Limits for tasks assessed                                 General Comments: appropriate for orientaiton questions. no family or caregivers present to confirm     General Comments  3-4 minutes O2 dropped to 84-87% on RA . pt on 2L 90s    Exercises     Shoulder Instructions      Home Living Family/patient expects to be discharged to:: Private residence Living Arrangements: Other relatives (4 people) Available Help at Discharge: Friend(s);Available 24 hours/day Type of Home: House Home Access: Stairs to enter [employer] of Steps: 2   Home Layout: Two level;Able to live on main level with bedroom/bathroom Alternate Level Stairs-Number of Steps: flight Alternate Level Stairs-Rails: Right;Left Bathroom Shower/Tub: Chief Strategy Officer: Standard     Home Equipment: Rollator (4 wheels);Cane - single point;BSC/3in1;Shower seat;Wheelchair - manual;Other (comment) (Oxygen without o2)   Additional Comments: no animals      Prior Functioning/Environment Prior Level of Function : Needs assist       Physical Assist : Mobility (physical);ADLs (physical) Mobility (physical): Bed mobility;Transfers   Mobility Comments: Pt reports his friends pick him up and put him in the w/c and take him wherever he needs to go ADLs Comments: assist with bathing and dressing        OT Problem List: Decreased strength;Decreased activity tolerance;Impaired balance (sitting and/or standing);Decreased cognition;Decreased safety awareness;Decreased knowledge of use of DME or AE;Decreased knowledge of precautions;Cardiopulmonary status limiting  activity;Impaired UE functional use      OT Treatment/Interventions: Self-care/ADL training;Energy conservation;DME and/or AE instruction;Therapeutic activities;Patient/family education    OT Goals(Current goals can be found in the care plan section) Acute Rehab OT Goals Patient Stated Goal: pt states he is close to baseline OT Goal Formulation: With patient Time For Goal Achievement: 01/30/23 Potential to Achieve Goals: Good  OT Frequency: Min 1X/week    Co-evaluation PT/OT/SLP Co-Evaluation/Treatment: Yes Reason for Co-Treatment: Necessary to address cognition/behavior during functional activity;Complexity of the patient's impairments (multi-system involvement);For patient/therapist safety;To address functional/ADL transfers PT goals addressed during session: Mobility/safety with mobility OT goals addressed during session: ADL's and self-care;Proper use of Adaptive equipment and DME;Strengthening/ROM      AM-PAC OT 6 Clicks Daily Activity     Outcome Measure Help from another person eating meals?: A Little Help from another person taking care of personal grooming?: A Little Help from another person toileting, which includes using toliet, bedpan, or urinal?: A Lot Help from another person bathing (including washing, rinsing, drying)?: A Lot Help from another person to put on and taking off regular upper body clothing?: A Lot Help  from another person to put on and taking off regular lower body clothing?: A Lot 6 Click Score: 14   End of Session Equipment Utilized During Treatment: Oxygen Nurse Communication: Mobility status;Precautions;Need for lift equipment  Activity Tolerance: Patient tolerated treatment well Patient left: in bed;with call bell/phone within reach;with bed alarm set  OT Visit Diagnosis: Unsteadiness on feet (R26.81);Muscle weakness (generalized) (M62.81)                Time: 8599-8567 OT Time Calculation (min): 32 min Charges:  OT General Charges $OT  Visit: 1 Visit OT Evaluation $OT Eval Moderate Complexity: 1 Mod   Brynn, OTR/L  Acute Rehabilitation Services Office: [phone] .   Ely Molt 01/16/2023, 5:37 PM

## 2023-01-17 DIAGNOSIS — J9621 Acute and chronic respiratory failure with hypoxia: Secondary | ICD-10-CM | POA: Diagnosis not present

## 2023-01-17 LAB — GLUCOSE, CAPILLARY
Glucose-Capillary: 114 mg/dL — ABNORMAL HIGH (ref 70–99)
Glucose-Capillary: 82 mg/dL (ref 70–99)
Glucose-Capillary: 100 mg/dL — ABNORMAL HIGH (ref 70–99)

## 2023-01-17 LAB — CBC
HCT: 34.8 % — ABNORMAL LOW (ref 39.0–52.0)
Hemoglobin: 10.5 g/dL — ABNORMAL LOW (ref 13.0–17.0)
MCH: 25.1 pg — ABNORMAL LOW (ref 26.0–34.0)
MCHC: 30.2 g/dL (ref 30.0–36.0)
MCV: 83.3 fL (ref 80.0–100.0)
Platelets: 95 10*3/uL — ABNORMAL LOW (ref 150–400)
RBC: 4.18 MIL/uL — ABNORMAL LOW (ref 4.22–5.81)
RDW: 18.5 % — ABNORMAL HIGH (ref 11.5–15.5)
WBC: 8 10*3/uL (ref 4.0–10.5)
nRBC: 0 % (ref 0.0–0.2)

## 2023-01-17 LAB — RENAL FUNCTION PANEL
Albumin: 2.3 g/dL — ABNORMAL LOW (ref 3.5–5.0)
Anion gap: 19 — ABNORMAL HIGH (ref 5–15)
BUN: 63 mg/dL — ABNORMAL HIGH (ref 6–20)
CO2: 21 mmol/L — ABNORMAL LOW (ref 22–32)
Calcium: 8.2 mg/dL — ABNORMAL LOW (ref 8.9–10.3)
Chloride: 95 mmol/L — ABNORMAL LOW (ref 98–111)
Creatinine, Ser: 8.56 mg/dL — ABNORMAL HIGH (ref 0.61–1.24)
GFR, Estimated: 7 mL/min — ABNORMAL LOW (ref >60)
Glucose, Bld: 75 mg/dL (ref 70–99)
Phosphorus: >30 mg/dL — ABNORMAL HIGH (ref 2.5–4.6)
Potassium: 5.8 mmol/L — ABNORMAL HIGH (ref 3.5–5.1)
Sodium: 135 mmol/L (ref 135–145)

## 2023-01-17 MED ORDER — BUDESONIDE 0.5 MG/2ML IN SUSP
0.5000 mg | Freq: Two times a day (BID) | RESPIRATORY_TRACT | Status: DC
  Administered 2023-01-17 – 2023-01-18 (×4): 0.5 mg via RESPIRATORY_TRACT
  Filled 2023-01-17 (×4): qty 2

## 2023-01-17 MED ORDER — SUCROFERRIC OXYHYDROXIDE 500 MG PO CHEW
1500.0000 mg | CHEWABLE_TABLET | Freq: Three times a day (TID) | ORAL | Status: DC
  Administered 2023-01-17 – 2023-01-18 (×2): 1500 mg via ORAL
  Filled 2023-01-17 (×3): qty 3

## 2023-01-17 MED ORDER — ARFORMOTEROL TARTRATE 15 MCG/2ML IN NEBU
15.0000 ug | INHALATION_SOLUTION | Freq: Two times a day (BID) | RESPIRATORY_TRACT | Status: DC
  Administered 2023-01-17 – 2023-01-18 (×4): 15 ug via RESPIRATORY_TRACT
  Filled 2023-01-17 (×4): qty 2

## 2023-01-17 NOTE — Progress Notes (Signed)
 Physical Therapy Treatment  Patient Details Name: Nathaniel White MRN: [id number] DOB: 1960-09-05 Today's Date: 01/17/2023   History of Present Illness 63 yo male admitted with respiratory distress  PMH anemia, anxiety COPD ESRD (TTS) HTN, CVA L hemiparesis, memory loss, MR , PVD, CHF, asthma, HFrEF tobacco abuse    PT Comments  Pt progressing towards physical therapy goals. Focus of session was initially transfer training within the [facility] however upon sitting up EOB pt with labored breathing, wheezing, and O2 saturations dropped to 74% on 2L/min supplemental O2. Improved to 88-90% on 4L/min O2 and RN notified. Increased assist required for return to supine and reposition in the bed. Pt unable to tolerate OOB mobility training at this time. Will continue to follow and progress as able per POC.    If plan is discharge home, recommend the following: A lot of help with walking and/or transfers;A lot of help with bathing/dressing/bathroom;Assistance with cooking/housework;Assist for transportation;Help with stairs or ramp for entrance   Can travel by private vehicle        Equipment Recommendations  Other (comment) (TBD)    Recommendations for Other Services       Precautions / Restrictions Precautions Precautions: Fall Precaution Comments: oxygen 2L [place] at baseline Restrictions Weight Bearing Restrictions Per Provider Order: No     Mobility  Bed Mobility Overal bed mobility: Needs Assistance Bed Mobility: Supine to Sit, Sit to Supine     Supine to sit: Min assist Sit to supine: Min assist, +2 for physical assistance   General bed mobility comments: Increased time and effort. Pt asking for HOB to be lowered. Min assist for transition fully around to EOB and pt immediately with R lateral LOB/lean onto R elbow. He starts stating I can't breathe with labored respirations and audible wheezing. Sats dropped to 74% on 2L/min supplemental O2. Pt cued for slow, pursed-lip breathing and  supplemental O2 increased to 4L/min. Attempted multiple times for repositioning in sitting for upright lung position but pt with continued R posteriolateral lean. Then attempted return to supine with difficulty getting pt positioned straight. RN notified and present in room. +2 assist required for return to supine and repositioning in the bed.    Transfers                        Ambulation/Gait               General Gait Details: Not able   Stairs             Wheelchair Mobility     Tilt Bed    Modified Rankin (Stroke Patients Only)       Balance Overall balance assessment: Needs assistance Sitting-balance support: No upper extremity supported, Feet supported Sitting balance-Leahy Scale: Fair                                      Cognition Arousal: Alert Behavior During Therapy: WFL for tasks assessed/performed Overall Cognitive Status: Within Functional Limits for tasks assessed                                          Exercises      General Comments        Pertinent Vitals/Pain Pain Assessment Pain Assessment: No/denies pain  Home Living                          Prior Function            PT Goals (current goals can now be found in the care plan section) Acute Rehab PT Goals Patient Stated Goal: Get back home Potential to Achieve Goals: Good Progress towards PT goals: Progressing toward goals    Frequency    Min 1X/week      PT Plan      Co-evaluation              AM-PAC PT 6 Clicks Mobility   Outcome Measure  Help needed turning from your back to your side while in a flat bed without using bedrails?: A Little Help needed moving from lying on your back to sitting on the side of a flat bed without using bedrails?: A Little Help needed moving to and from a bed to a chair (including a wheelchair)?: A Lot Help needed standing up from a chair using your arms (e.g.,  wheelchair or bedside chair)?: Total Help needed to walk in hospital room?: Total Help needed climbing 3-5 steps with a railing? : Total 6 Click Score: 11    End of Session Equipment Utilized During Treatment: Oxygen Activity Tolerance: Patient tolerated treatment well Patient left: in bed;with call bell/phone within reach Nurse Communication: Mobility status PT Visit Diagnosis: Other abnormalities of gait and mobility (R26.89)     Time: 8962-8946 PT Time Calculation (min) (ACUTE ONLY): 16 min  Charges:    $Therapeutic Activity: 8-22 mins PT General Charges $$ ACUTE PT VISIT: 1 Visit                     Leita Sable, PT, DPT Acute Rehabilitation Services Secure Chat Preferred Office: [phone]    [name] 01/17/2023, 2:27 PM

## 2023-01-17 NOTE — Plan of Care (Signed)
  Problem: Respiratory: Goal: Ability to maintain a clear airway will improve Outcome: Progressing Goal: Levels of oxygenation will improve Outcome: Progressing   Problem: Pain Management: Goal: General experience of comfort will improve Outcome: Progressing   Problem: Safety: Goal: Ability to remain free from injury will improve Outcome: Progressing   Problem: Skin Integrity: Goal: Risk for impaired skin integrity will decrease Outcome: Progressing

## 2023-01-17 NOTE — Progress Notes (Signed)
 [facility] Progress Note   Subjective:   seen in room - for dialysis today. UFG 4L. Pt reported no dyspnea during exam but desat to 74% with PT afterwards. Pt increased from 2L to 4L and is sat 100% now. On 2L at home. Denies CP/N/V. Discussed elevated phosphorus which is likely due to diet. Patient voices understanding. Possible D/C today after dialysis.   Objective Vitals:   01/16/23 2143 01/17/23 0500 01/17/23 0604 01/17/23 0608  BP: 122/85  132/82 132/82  Pulse: (!) 57  64 64  Resp:      Temp: 98 F (36.7 C)   98.2 F (36.8 C)  TempSrc:      SpO2:   100% 100%  Weight:  70 kg    Height:       Physical Exam General: Chronically ill appearing man, NAD. Nasal O2 in place Heart: RRR; no murmur Lungs: Bibasilar rales present. Wheezing in upper lung fields.  Abdomen: soft, distended Extremities: 1+ LE edema Dialysis Access: L [facility]  Additional Objective Labs: Basic Metabolic Panel: Recent Labs  Lab 01/15/23 0516 01/15/23 0524 01/15/23 0534 01/15/23 0923 01/16/23 1200 01/17/23 0417  NA  --  138   < > 134* 134* 135  K  --  6.1*   < > 6.1* 5.1 5.8*  CL  --  96*  --   --  93* 95*  CO2  --  23  --   --  23 21*  GLUCOSE  --  131*  --   --  110* 75  BUN  --  79*  --   --  50* 63*  CREATININE  --  10.09*  --   --  7.13* 8.56*  CALCIUM   --  8.7*  --   --  8.6* 8.2*  PHOS 11.2*  --   --   --   --  >30.0*   < > = values in this interval not displayed.   Liver Function Tests: Recent Labs  Lab 01/15/23 0325 01/15/23 0524 01/17/23 0417  AST 27 26  --   ALT 35 36  --   ALKPHOS 107 112  --   BILITOT 0.7 0.8  --   PROT 7.5 7.8  --   ALBUMIN  2.3* 2.3* 2.3*    CBC: Recent Labs  Lab 01/15/23 0325 01/15/23 0332 01/15/23 0524 01/15/23 0534 01/15/23 0923 01/16/23 1200 01/17/23 0415  WBC 8.6  --  7.9  --   --  7.0 8.0  NEUTROABS 5.8  --   --   --   --   --   --   HGB 12.1*   < > 12.3*   < > 12.2* 10.6* 10.5*  HCT 40.2   < > 41.1   < > 36.0* 34.9* 34.8*   MCV 85.2  --  84.9  --   --  84.5 83.3  PLT 85*  --  95*  --   --  87* 95*   < > = values in this interval not displayed.   Blood Culture    Component Value Date/Time   SDES BLOOD RIGHT ARM 01/15/2023 1114   SPECREQUEST  01/15/2023 1114    BOTTLES DRAWN AEROBIC AND ANAEROBIC Blood Culture results may not be optimal due to an inadequate volume of blood received in culture bottles   CULT  01/15/2023 1114    NO GROWTH 2 DAYS Performed at [facility] Lab, [address] [place], [place] [address]    REPTSTATUS  PENDING 01/15/2023 1114    CBG: Recent Labs  Lab 01/16/23 1123 01/16/23 1549 01/16/23 1658 01/16/23 2142 01/17/23 0733  GLUCAP 269* 94 84 105* 114*    Studies/Results: DG CHEST PORT 1 VIEW Result Date: 01/16/2023 CLINICAL DATA:  Pneumonia. Dialysis patient with difficulty breathing. EXAM: PORTABLE CHEST 1 VIEW COMPARISON:  Chest CT dated 01/15/2023. FINDINGS: Dialysis catheter in similar position. Slight interval improvement in bilateral interstitial and airspace opacities compared to prior radiograph. No pneumothorax. Stable mild cardiomegaly. Atherosclerotic calcification of the aorta. No acute osseous pathology. IMPRESSION: Slight interval improvement in bilateral interstitial and airspace opacities. Electronically Signed   By: Vanetta Chou M.D.   On: 01/16/2023 09:34   Medications:  anticoagulant sodium citrate       (feeding supplement) PROSource Plus  30 mL Oral BID BM   amiodarone   200 mg Oral Daily   amoxicillin -clavulanate  1 tablet Oral Daily   apixaban   5 mg Oral BID   Chlorhexidine  Gluconate Cloth  6 each Topical Q0600   doxercalciferol   2 mcg Intravenous Q T,Th,Sa-HD   insulin  aspart  0-5 Units Subcutaneous QHS   insulin  aspart  0-6 Units Subcutaneous TID WC   midodrine   10 mg Oral TID WC   predniSONE   40 mg Oral Q breakfast   sucroferric oxyhydroxide  1,500 mg Oral TID WC    Dialysis Orders TTS - AF 4:15hr, 400/500, EDW 67.5kg, 2K/2Ca, TDC, no  heparin  - Mircera 50mcg Iv q 2 weeks (last 12/26) - Hectoral 2mcg Iv q HD - Parsabiv 7.5mg  IV q HD   Assessment/Plan: AHRF/pulm edema + COPD exacerbation + presumed pneumonia: Required BiPAP on admit, now weaned to [place] O2. Initially on Zosyn , transitioning to Augmentin . Continuing steroids. ESRD: Usual TTS schedule. Next HD today. K+ elevated, dialyzing today. HypoTN/volume: BP stable on mido TID, still with pulm edema/effusion -> max UF today, trying for 4L. Per patient, cramps severely with >3L UF goal. Anemia of ESRD: Hgb 10.5 - no ESA needed at this time Secondary HPTH: Ca ok, Phos very high - continue Velphoro  as binder and increased dose to 1500mg  TID. Restarted Hectoral with HD. Nutrition: Alb low, adding supplements.  Signe Sick, PA-S Izetta Boehringer, PA-C 01/17/2023, 11:13 AM  [employer]

## 2023-01-17 NOTE — Progress Notes (Signed)
 PROGRESS NOTE    Nathaniel White  FMW:[id number] DOB: July 17, 1960 DOA: 01/15/2023 PCP: Theotis Haze ORN, NP    Brief Narrative:   Nathaniel White is a 63 y.o. male with past medical history significant for chronic hypoxic respiratory failure/COPD on 2 L nasal cannula at baseline, ESRD on HD TTS, paroxysmal atrial fibrillation on Eliquis , chronic systolic congestive heart failure, cirrhosis, anemia of chronic medical/renal disease, history of PE, history of CVA, diet-controlled type 2 diabetes mellitus, who presented to [facility] ED on 01/15/2023 via EMS from home with progressive shortness of breath.  Family reports compliance with hemodialysis.  On EMS arrival, patient was oxygenating 81% on 2 L noted, and placed on CPAP, with improvement of symptoms and transported to ED for further evaluation management.  In the ED, temperature 98.3 F, HR 51, RR 23, BP 119/63, SpO2 100% on BiPAP with FiO2 60%.  VBG with pH 7.369, pCO2 47.2, pCO2 29.  WBC 8.6, hemoglobin 12.1, platelet count 85.  Sodium 137, potassium 6.5, chloride 96, CO2 22, glucose 127, BUN 78, creatinine 10.10.  Calcium  8.7, AST 27, ALT 35, total bilirubin 0.7.  BNP 3540.1.  High sensitive troponin 117.  Procalcitonin 1.30.  COVID-19/RSV/influenza PCR negative.  Respiratory viral panel negative.  Chest x-ray with increased interstitial/patchy airspace disease left mid and bilateral lower lung fields, stable cardiomegaly with central vascular congestion and small pleural effusions. ED physician reported that with the concern for pneumonia and COPD exacerbation patient has been treated with albuterol  nebulizer and broad-spectrum antibiotic has been started.  For hyperkalemia patient has been shifted.  For volume overload and acute exacerbation of CHF ED physician Dr. Melvenia has been consulted nephrology and waiting for response.   Hospitalist has been contacted for management of acute hypoxic respiratory failure in the context of COPD exacerbation, CHF  exacerbation, volume overload, pneumonia and hyperkalemia.  Significant Hospital events: 1/7: Admit to [facility], nephrology consulted.  Per Dr. Geralynn, too unstable for HD on the floor requesting transfer to ICU, PCCM consulted.  Underwent HD and BiPAP removed. 1/8: Transferred to [facility], now on 5 L nasal cannula. 1/9: Continues with shortness of breath, pending HD today  Assessment & Plan:   Acute on chronic hypoxic respiratory failure, POA Patient presenting to the ED with progressive shortness of breath.  Etiology likely multifactorial in the setting of dietary indiscretions, volume overload, systolic CHF exacerbation, community-acquired pneumonia as well as COPD exacerbation.  Patient initially requiring BiPAP, underwent hemodialysis and placed on empiric antibiotics with Zosyn , IV steroids for COPD exacerbation with improvement of symptoms and BiPAP weaned off back to nasal cannula. -- Continue supplemental oxygen, maintain SpO2 greater than 88% -- Continue treatment as below  Community-acquired pneumonia Chest x-ray with interstitial/patchy airspace disease left mid and bilateral lower lung fields concerning for pneumonia.  Patient is afebrile without leukocytosis but procalcitonin elevated. -- Augmentin  500-125 mg p.o. daily  Acute on chronic systolic congestive heart failure Chest x-ray with pulmonary edema/central vascular congestion at time of admission with elevated BNP.  Etiology likely secondary to dietary indiscretions with noncompliance with amounts of fluid intake. -- Renal diet with 1.2 L fluid restriction daily -- Continue volume management with HD; planned next HD today -- Strict I's and O's and daily weights.  Hyperkalemia: Potassium 6.5 on admission; underwent HD.  Potassium 5.8 this morning, for HD today -- Renal function panel daily  Hyperphosphatemia Phosphorus level >30; etiology secondary to dietary noncompliance -- Velphoro  1500mg  PO TID -- Renal panel daily  ESRD  on HD TTS -- Nephrology following, appreciate assistance -- Midodrine  10 mg p.o. 3 times daily  Acute COPD exacerbation -- Prednisone  40 mg p.o. daily -- Brovana  neb twice daily -- Pulmicort  neb twice daily -- Albuterol  neb every 4 hours as needed for wheezing/shortness of breath -- Goal SpO2 >88%  Paroxysmal atrial fibrillation on anticoagulation -- Holding home amiodarone  due to bradycardia -- Eliquis  5 mg p.o. twice daily  History of PE --Continue Eliquis   Type 2 diabetes mellitus Controlled at baseline, hemoglobin A1c 4.8 11/2022, well-controlled. -- SSI for coverage -- CBG before every meal/at bedtime  Weakness/debility/deconditioning: -- PT/OT recommend home health -- Continue therapy efforts while inpatient    DVT prophylaxis: SCDs Start: 01/15/23 0904 SCDs Start: 01/15/23 0452 apixaban  (ELIQUIS ) tablet 5 mg    Code Status: Full Code Family Communication: No family present bedside this morning  Disposition Plan:  Level of care: Telemetry Medical Status is: Inpatient Remains inpatient appropriate because: IV antibiotics, needs further HD while inpatient, needs further titration down of supplemental oxygen to his baseline 2 L nasal cannula    Consultants:  PCCM: Signed off 01/16/2023 Nephrology  Procedures:  None  Antimicrobials:  Zosyn  1/7 - 1/8 Doxycycline  1/7 - 1/7 Ceftriaxone  1/7 - 1/7 Augmentin  1/8>>   Subjective: Patient seen examined bedside, resting comfortable.  Lying in bed.  Remains on 3 L nasal, which is slightly higher than his home dose of 2 L.  Continues with shortness of breath, wheezing.  Plan further HD today.  Nephrology PA and RN present at bedside.  Discussed with patient once again needs to avoid excessive amounts of liquid intake.  Noted box of [employer] fried chicken at bedside.  No other specific complaints, questions or concerns at this time.  Denies headache, no dizziness, no chest pain, no palpitations, no abdominal pain, no  fever/chills/night sweats, no nausea/vomiting/diarrhea, no focal weakness, no fatigue, no paresthesias.  No acute events overnight per nursing staff.  Objective: Vitals:   01/16/23 2143 01/17/23 0500 01/17/23 0604 01/17/23 0608  BP: 122/85  132/82 132/82  Pulse: (!) 57  64 64  Resp:      Temp: 98 F (36.7 C)   98.2 F (36.8 C)  TempSrc:      SpO2:   100% 100%  Weight:  70 kg    Height:        Intake/Output Summary (Last 24 hours) at 01/17/2023 1230 Last data filed at 01/17/2023 0857 Gross per 24 hour  Intake 360 ml  Output 0 ml  Net 360 ml   Filed Weights   01/15/23 1937 01/16/23 0500 01/17/23 0500  Weight: 72.6 kg 70 kg 70 kg    Examination:  Physical Exam: GEN: NAD, alert and oriented x 3, chronically ill appearance, appears older than stated age HEENT: NCAT, PERRL, EOMI, sclera clear, MMM PULM: Breath sounds slight diminished bilateral bases, mild crackles/wheezing, normal respiratory effort without accessory muscle use, on 3 L nasal cannula with SpO2 96% at rest (2l [place] baseline) CV: RRR w/o M/G/R GI: abd soft, NTND, NABS, no R/G/M MSK: no peripheral edema, moves all extremities independently NEURO: CN II-XII intact, no focal deficits, sensation to light touch intact PSYCH: normal mood/affect Integumentary: No concerning rashes/lesions/wounds noted on exposed skin surfaces.    Data Reviewed: I have personally reviewed following labs and imaging studies  CBC: Recent Labs  Lab 01/15/23 0325 01/15/23 0332 01/15/23 0524 01/15/23 0534 01/15/23 0923 01/16/23 1200 01/17/23 0415  WBC 8.6  --  7.9  --   --  7.0 8.0  NEUTROABS 5.8  --   --   --   --   --   --   HGB 12.1*   < > 12.3* 14.3 12.2* 10.6* 10.5*  HCT 40.2   < > 41.1 42.0 36.0* 34.9* 34.8*  MCV 85.2  --  84.9  --   --  84.5 83.3  PLT 85*  --  95*  --   --  87* 95*   < > = values in this interval not displayed.   Basic Metabolic Panel: Recent Labs  Lab 01/15/23 0325 01/15/23 0332 01/15/23 0516  01/15/23 0524 01/15/23 0534 01/15/23 0923 01/16/23 1200 01/17/23 0417  NA 137   < >  --  138 136 134* 134* 135  K 6.5*   < >  --  6.1* 5.9* 6.1* 5.1 5.8*  CL 96*  --   --  96*  --   --  93* 95*  CO2 22  --   --  23  --   --  23 21*  GLUCOSE 127*  --   --  131*  --   --  110* 75  BUN 78*  --   --  79*  --   --  50* 63*  CREATININE 10.10*  --   --  10.09*  --   --  7.13* 8.56*  CALCIUM  8.7*  --   --  8.7*  --   --  8.6* 8.2*  PHOS  --   --  11.2*  --   --   --   --  >30.0*   < > = values in this interval not displayed.   GFR: Estimated Creatinine Clearance: 9.1 mL/min (A) (by C-G formula based on SCr of 8.56 mg/dL (H)). Liver Function Tests: Recent Labs  Lab 01/15/23 0325 01/15/23 0524 01/17/23 0417  AST 27 26  --   ALT 35 36  --   ALKPHOS 107 112  --   BILITOT 0.7 0.8  --   PROT 7.5 7.8  --   ALBUMIN  2.3* 2.3* 2.3*   No results for input(s): LIPASE, AMYLASE in the last 168 hours. No results for input(s): AMMONIA in the last 168 hours. Coagulation Profile: No results for input(s): INR, PROTIME in the last 168 hours. Cardiac Enzymes: No results for input(s): CKTOTAL, CKMB, CKMBINDEX, TROPONINI in the last 168 hours. BNP (last 3 results) No results for input(s): PROBNP in the last 8760 hours. HbA1C: No results for input(s): HGBA1C in the last 72 hours. CBG: Recent Labs  Lab 01/16/23 1549 01/16/23 1658 01/16/23 2142 01/17/23 0733 01/17/23 1131  GLUCAP 94 84 105* 114* 82   Lipid Profile: No results for input(s): CHOL, HDL, LDLCALC, TRIG, CHOLHDL, LDLDIRECT in the last 72 hours. Thyroid Function Tests: No results for input(s): TSH, T4TOTAL, FREET4, T3FREE, THYROIDAB in the last 72 hours. Anemia Panel: No results for input(s): VITAMINB12, FOLATE, FERRITIN, TIBC, IRON, RETICCTPCT in the last 72 hours. Sepsis Labs: Recent Labs  Lab 01/15/23 0524 01/16/23 1200  PROCALCITON 1.30 1.85    Recent Results (from  the past 240 hours)  Resp panel by RT-PCR (RSV, Flu A&B, Covid) Anterior Nasal Swab     Status: None   Collection Time: 01/15/23  3:25 AM   Specimen: Anterior Nasal Swab  Result Value Ref Range Status   SARS Coronavirus 2 by RT PCR NEGATIVE NEGATIVE Final   Influenza A by PCR NEGATIVE NEGATIVE Final   Influenza B by PCR NEGATIVE NEGATIVE Final  Comment: (NOTE) The Xpert Xpress SARS-CoV-2/FLU/RSV plus assay is intended as an aid in the diagnosis of influenza from Nasopharyngeal swab specimens and should not be used as a sole basis for treatment. Nasal washings and aspirates are unacceptable for Xpert Xpress SARS-CoV-2/FLU/RSV testing.  Fact Sheet for Patients: bloggercourse.com  Fact Sheet for Healthcare Providers: seriousbroker.it  This test is not yet approved or cleared by the [place]  FDA and has been authorized for detection and/or diagnosis of SARS-CoV-2 by FDA under an Emergency Use Authorization (EUA). This EUA will remain in effect (meaning this test can be used) for the duration of the COVID-19 declaration under Section 564(b)(1) of the Act, 21 U.S.C. section 360bbb-3(b)(1), unless the authorization is terminated or revoked.     Resp Syncytial Virus by PCR NEGATIVE NEGATIVE Final    Comment: (NOTE) Fact Sheet for Patients: bloggercourse.com  Fact Sheet for Healthcare Providers: seriousbroker.it  This test is not yet approved or cleared by the [place]  FDA and has been authorized for detection and/or diagnosis of SARS-CoV-2 by FDA under an Emergency Use Authorization (EUA). This EUA will remain in effect (meaning this test can be used) for the duration of the COVID-19 declaration under Section 564(b)(1) of the Act, 21 U.S.C. section 360bbb-3(b)(1), unless the authorization is terminated or revoked.  Performed at [facility], [address]  [place], [place] [address]   Respiratory (~20 pathogens) panel by PCR     Status: None   Collection Time: 01/15/23  3:25 AM   Specimen: Nasopharyngeal Swab; Respiratory  Result Value Ref Range Status   Adenovirus NOT DETECTED NOT DETECTED Final   Coronavirus 229E NOT DETECTED NOT DETECTED Final    Comment: (NOTE) The Coronavirus on the Respiratory Panel, DOES NOT test for the novel  Coronavirus (2019 nCoV)    Coronavirus HKU1 NOT DETECTED NOT DETECTED Final   Coronavirus NL63 NOT DETECTED NOT DETECTED Final   Coronavirus OC43 NOT DETECTED NOT DETECTED Final   Metapneumovirus NOT DETECTED NOT DETECTED Final   Rhinovirus / Enterovirus NOT DETECTED NOT DETECTED Final   Influenza A NOT DETECTED NOT DETECTED Final   Influenza B NOT DETECTED NOT DETECTED Final   Parainfluenza Virus 1 NOT DETECTED NOT DETECTED Final   Parainfluenza Virus 2 NOT DETECTED NOT DETECTED Final   Parainfluenza Virus 3 NOT DETECTED NOT DETECTED Final   Parainfluenza Virus 4 NOT DETECTED NOT DETECTED Final   Respiratory Syncytial Virus NOT DETECTED NOT DETECTED Final   Bordetella pertussis NOT DETECTED NOT DETECTED Final   Bordetella Parapertussis NOT DETECTED NOT DETECTED Final   Chlamydophila pneumoniae NOT DETECTED NOT DETECTED Final   Mycoplasma pneumoniae NOT DETECTED NOT DETECTED Final    Comment: Performed at [facility], [address], [place], [place] [address]  Culture, blood (routine x 2) Call MD if unable to obtain prior to antibiotics being given     Status: None (Preliminary result)   Collection Time: 01/15/23  5:16 AM   Specimen: BLOOD RIGHT ARM  Result Value Ref Range Status   Specimen Description BLOOD RIGHT ARM  Final   Special Requests   Final    BOTTLES DRAWN AEROBIC AND ANAEROBIC Blood Culture results may not be optimal due to an inadequate volume of blood received in culture bottles   Culture   Final    NO GROWTH 2 DAYS Performed at [facility], [address], [place],  [place] [address]    Report Status PENDING  Incomplete  Culture,  blood (routine x 2) Call MD if unable to obtain prior to antibiotics being given     Status: None (Preliminary result)   Collection Time: 01/15/23  5:24 AM   Specimen: BLOOD RIGHT ARM  Result Value Ref Range Status   Specimen Description BLOOD RIGHT ARM  Final   Special Requests   Final    BOTTLES DRAWN AEROBIC AND ANAEROBIC Blood Culture adequate volume   Culture   Final    NO GROWTH 2 DAYS Performed at [facility], [address], [place], [place] [address]    Report Status PENDING  Incomplete  MRSA Next Gen by PCR, Nasal     Status: None   Collection Time: 01/15/23 10:32 AM   Specimen: Nasal Mucosa; Nasal Swab  Result Value Ref Range Status   MRSA by PCR Next Gen NOT DETECTED NOT DETECTED Final    Comment: (NOTE) The GeneXpert MRSA Assay (FDA approved for NASAL specimens only), is one component of a comprehensive MRSA colonization surveillance program. It is not intended to diagnose MRSA infection nor to guide or monitor treatment for MRSA infections. Test performance is not FDA approved in patients less than 52 years old. Performed at [facility], [address] [place], [place] [address]   Culture, blood (Routine X 2) w Reflex to ID Panel     Status: None (Preliminary result)   Collection Time: 01/15/23 11:00 AM   Specimen: BLOOD RIGHT ARM  Result Value Ref Range Status   Specimen Description BLOOD RIGHT ARM  Final   Special Requests   Final    BOTTLES DRAWN AEROBIC AND ANAEROBIC Blood Culture results may not be optimal due to an inadequate volume of blood received in culture bottles   Culture   Final    NO GROWTH 2 DAYS Performed at [facility], [address] [place], [place] [address]    Report Status PENDING  Incomplete  Culture, blood (Routine X 2) w Reflex to ID Panel     Status: None (Preliminary result)   Collection Time: 01/15/23 11:14 AM   Specimen: BLOOD RIGHT ARM  Result Value  Ref Range Status   Specimen Description BLOOD RIGHT ARM  Final   Special Requests   Final    BOTTLES DRAWN AEROBIC AND ANAEROBIC Blood Culture results may not be optimal due to an inadequate volume of blood received in culture bottles   Culture   Final    NO GROWTH 2 DAYS Performed at [facility], [address] [place], [place] [address]    Report Status PENDING  Incomplete         Radiology Studies: DG CHEST PORT 1 VIEW Result Date: 01/16/2023 CLINICAL DATA:  Pneumonia. Dialysis patient with difficulty breathing. EXAM: PORTABLE CHEST 1 VIEW COMPARISON:  Chest CT dated 01/15/2023. FINDINGS: Dialysis catheter in similar position. Slight interval improvement in bilateral interstitial and airspace opacities compared to prior radiograph. No pneumothorax. Stable mild cardiomegaly. Atherosclerotic calcification of the aorta. No acute osseous pathology. IMPRESSION: Slight interval improvement in bilateral interstitial and airspace opacities. Electronically Signed   By: Vanetta Chou M.D.   On: 01/16/2023 09:34        Scheduled Meds:  (feeding supplement) PROSource Plus  30 mL Oral BID BM   amoxicillin -clavulanate  1 tablet Oral Daily   apixaban   5 mg Oral BID   Chlorhexidine  Gluconate Cloth  6 each Topical Q0600   doxercalciferol   2 mcg Intravenous Q T,Th,Sa-HD  insulin  aspart  0-5 Units Subcutaneous QHS   insulin  aspart  0-6 Units Subcutaneous TID WC   midodrine   10 mg Oral TID WC   predniSONE   40 mg Oral Q breakfast   sucroferric oxyhydroxide  1,500 mg Oral TID WC   Continuous Infusions:  anticoagulant sodium citrate        LOS: 2 days    Time spent: 52 minutes spent on chart review, discussion with nursing staff, consultants, updating family and interview/physical exam; more than 50% of that time was spent in counseling and/or coordination of care.    Camellia PARAS Brekyn Huntoon, DO [facility] Available via Epic secure chat 7am-7pm After these hours, please refer  to coverage provider listed on [email or web address] 01/17/2023, 12:30 PM

## 2023-01-17 NOTE — Progress Notes (Signed)
 PT sat pt EOB and O2 sat down to 74% on 2L. Pt now on 4L, O2 sat 94%. Expiratory wheezing present. Admin PRN albuterol - pt reports some improvement with breathing after breathing treatment. Minerva Areola [place], DO notified.

## 2023-01-17 NOTE — Progress Notes (Signed)
 Per CCMD, pt HR down to 39-40s and sustained, had some junctional rhythm, and QTc is prolonged >500. HR now 56. Pt asymptomatic. Pt remains on telemetry. Minerva Areola [place], DO notified. No new orders at this time.

## 2023-01-17 NOTE — Progress Notes (Signed)
 Per CCMD, HR 53 and pt sustaining in junctional rhythm. Pt asymptomatic. Minerva Areola [place], DO notified. See new order for discontinuation of amiodarone.

## 2023-01-18 DIAGNOSIS — J9621 Acute and chronic respiratory failure with hypoxia: Secondary | ICD-10-CM | POA: Diagnosis not present

## 2023-01-18 LAB — RENAL FUNCTION PANEL
Albumin: 2.4 g/dL — ABNORMAL LOW (ref 3.5–5.0)
Anion gap: 17 — ABNORMAL HIGH (ref 5–15)
BUN: 58 mg/dL — ABNORMAL HIGH (ref 6–20)
CO2: 24 mmol/L (ref 22–32)
Calcium: 8.8 mg/dL — ABNORMAL LOW (ref 8.9–10.3)
Chloride: 94 mmol/L — ABNORMAL LOW (ref 98–111)
Creatinine, Ser: 7.13 mg/dL — ABNORMAL HIGH (ref 0.61–1.24)
GFR, Estimated: 8 mL/min — ABNORMAL LOW (ref >60)
Glucose, Bld: 111 mg/dL — ABNORMAL HIGH (ref 70–99)
Phosphorus: 9 mg/dL — ABNORMAL HIGH (ref 2.5–4.6)
Potassium: 4.9 mmol/L (ref 3.5–5.1)
Sodium: 135 mmol/L (ref 135–145)

## 2023-01-18 LAB — CBC
HCT: 39.7 % (ref 39.0–52.0)
Hemoglobin: 12.1 g/dL — ABNORMAL LOW (ref 13.0–17.0)
MCH: 25.5 pg — ABNORMAL LOW (ref 26.0–34.0)
MCHC: 30.5 g/dL (ref 30.0–36.0)
MCV: 83.6 fL (ref 80.0–100.0)
Platelets: 98 10*3/uL — ABNORMAL LOW (ref 150–400)
RBC: 4.75 MIL/uL (ref 4.22–5.81)
RDW: 18.3 % — ABNORMAL HIGH (ref 11.5–15.5)
WBC: 6.9 10*3/uL (ref 4.0–10.5)
nRBC: 0 % (ref 0.0–0.2)

## 2023-01-18 LAB — GLUCOSE, CAPILLARY
Glucose-Capillary: 122 mg/dL — ABNORMAL HIGH (ref 70–99)
Glucose-Capillary: 141 mg/dL — ABNORMAL HIGH (ref 70–99)
Glucose-Capillary: 131 mg/dL — ABNORMAL HIGH (ref 70–99)
Glucose-Capillary: 149 mg/dL — ABNORMAL HIGH (ref 70–99)

## 2023-01-18 NOTE — Progress Notes (Addendum)
 Physical Therapy Treatment Patient Details Name: Nathaniel White MRN: [id number] DOB: Aug 27, 1960 Today's Date: 01/18/2023   History of Present Illness 63 yo male admitted with respiratory distress. PMH anemia, anxiety, COPD, ESRD (TTS), HTN, CVA L hemiparesis, memory loss, MR, PVD, CHF, asthma, HFrEF, tobacco abuse.    PT Comments  Pt received in supine, agreeable to therapy session with encouragement but deferring to attempt EOB/OOB mobility. Pt performed supine BLE A/AAROM exercises for strengthening and teachback. Pt instructed on pressure relief frequency/technique and he reports bil heel pain (foam dressings on both heels), RN notified he would benefit from Prevalon boots in hospital and once home given recent immobility to reduce risk of pressure sores. Pt agreeable to SCD placement on BLE and heels floated, pt needing up to modA for posterior supine scooting toward HOB and minA for long sitting with RUE support. SpO2 WFL in upright bed chair posture and HR WFL. Pt encouraged to participate in Q2H turns in supine to reduce risk of pressure sores in hospital and at home with friend assist, pt agreeable. Plan to work on OOB transfers with caregiver instruction next session to chair or wheelchair to make sure pt able to tolerate activity with stable respiratory status for return home. Pt continues to benefit from PT services to progress toward functional mobility goals, if pt does not have close to 24/7 physical assist for mobility/transfers, he would benefit from post-acute services prior to return home.     If plan is discharge home, recommend the following: A lot of help with walking and/or transfers;A lot of help with bathing/dressing/bathroom;Assistance with cooking/housework;Assist for transportation;Help with stairs or ramp for entrance   Can travel by private vehicle        Equipment Recommendations  Other (comment) (TBD)    Recommendations for Other Services       Precautions /  Restrictions Precautions Precautions: Fall Precaution Comments: oxygen 2L [place] at baseline Restrictions Weight Bearing Restrictions Per Provider Order: No     Mobility  Bed Mobility Overal bed mobility: Needs Assistance Bed Mobility: Supine to Sit     Supine to sit: Min assist, Used rails, HOB elevated     General bed mobility comments: Pt assisting to pull via R bed rail along with bed pad assist for scooting in supine toward HOB, no significant increase in dyspnea with transfer to long sitting in bed, pt defers EOB or OOB transfers due to fatigue and wanting to finish eating his dinner. Pt with slight R lean in bed chair posture, able to correct with cues and R bed rail to assist.    Transfers Overall transfer level: Needs assistance                 General transfer comment: pt defers to attempt despite heavy encouragement.    Ambulation/Gait               General Gait Details: Not ambulatory at baseline   Stairs             Wheelchair Mobility     Tilt Bed    Modified Rankin (Stroke Patients Only)       Balance Overall balance assessment: Needs assistance Sitting-balance support: Single extremity supported, Feet unsupported Sitting balance-Leahy Scale: Poor Sitting balance - Comments: R lean in upright posture, poor unsupported and fair to poor with RUE support for long sitting       Standing balance comment: Wheelchair user at baseline  Cognition Arousal: Alert Behavior During Therapy: WFL for tasks assessed/performed Overall Cognitive Status: Within Functional Limits for tasks assessed                                 General Comments: Pt relucant to attempt OOB/scoot transfer training. He reports confidence with his current LOF but then defers to demonstrate how he will get to wheelchair. No friends or family present this session to verify current/PLOF.        Exercises Other  Exercises Other Exercises: supine BLE AROM: ankle pumps (only on RLE), quad sets, heel slides, hip ADduction x5 reps ea for teachback, RLE AAROM straight leg raises a few reps for teachback Other Exercises: reviewed reclined to long sitting using RUE for support from elevated HOB posture bed crunches to work on seated balance and core strength, pt defers, wanting to finish eating his dinner    General Comments General comments (skin integrity, edema, etc.): Reviewed pressure relief frequency/technique, pt heels floated for comfort and SCDs placed and turned on; pillows repositioned behind his back to redistribute pressure in bed chair posture, pt instructed to notify staff to assist him with rolling to L/R sides Q2H in supine.      Pertinent Vitals/Pain Pain Assessment Pain Assessment: No/denies pain    Home Living                          Prior Function            PT Goals (current goals can now be found in the care plan section) Acute Rehab PT Goals Patient Stated Goal: To go home with caregiver friends support PT Goal Formulation: With patient Time For Goal Achievement: 01/31/23 (per PT eval 01/17/23) Progress towards PT goals: Progressing toward goals (slowly)    Frequency    Min 1X/week      PT Plan         AM-PAC PT 6 Clicks Mobility   Outcome Measure  Help needed turning from your back to your side while in a flat bed without using bedrails?: A Little Help needed moving from lying on your back to sitting on the side of a flat bed without using bedrails?: A Lot (without rails/flat bed) Help needed moving to and from a bed to a chair (including a wheelchair)?: A Lot Help needed standing up from a chair using your arms (e.g., wheelchair or bedside chair)?: Total Help needed to walk in hospital room?: Total Help needed climbing 3-5 steps with a railing? : Total 6 Click Score: 10    End of Session Equipment Utilized During Treatment: Oxygen Activity  Tolerance: Patient limited by fatigue Patient left: in bed;with call bell/phone within reach;with bed alarm set;with SCD's reapplied;Other (comment) (bed in chair posture, heels floated) Nurse Communication: Mobility status PT Visit Diagnosis: Other abnormalities of gait and mobility (R26.89)     Time: 8293-8283 PT Time Calculation (min) (ACUTE ONLY): 10 min  Charges:    $Therapeutic Exercise: 8-22 mins PT General Charges $$ ACUTE PT VISIT: 1 Visit                     Macayla Ekdahl P., PTA Acute Rehabilitation Services Secure Chat Preferred 9a-5:30pm Office: [phone]    Connell CHRISTELLA Blue 01/18/2023, 5:39 PM

## 2023-01-18 NOTE — Progress Notes (Signed)
Pt receives out-pt HD at [facility] on [address]. Will assist as needed.   Olivia Canter Renal Navigator [phone]

## 2023-01-18 NOTE — Progress Notes (Signed)
 PROGRESS NOTE    Nathaniel White  FMW:[id number] DOB: 08-Oct-1960 DOA: 01/15/2023 PCP: Theotis Haze ORN, NP    Brief Narrative:   Nathaniel White is a 63 y.o. male with past medical history significant for chronic hypoxic respiratory failure/COPD on 2 L nasal cannula at baseline, ESRD on HD TTS, paroxysmal atrial fibrillation on Eliquis , chronic systolic congestive heart failure, cirrhosis, anemia of chronic medical/renal disease, history of PE, history of CVA, diet-controlled type 2 diabetes mellitus, who presented to [facility] ED on 01/15/2023 via EMS from home with progressive shortness of breath.  Family reports compliance with hemodialysis.  On EMS arrival, patient was oxygenating 81% on 2 L noted, and placed on CPAP, with improvement of symptoms and transported to ED for further evaluation management.  In the ED, temperature 98.3 F, HR 51, RR 23, BP 119/63, SpO2 100% on BiPAP with FiO2 60%.  VBG with pH 7.369, pCO2 47.2, pCO2 29.  WBC 8.6, hemoglobin 12.1, platelet count 85.  Sodium 137, potassium 6.5, chloride 96, CO2 22, glucose 127, BUN 78, creatinine 10.10.  Calcium  8.7, AST 27, ALT 35, total bilirubin 0.7.  BNP 3540.1.  High sensitive troponin 117.  Procalcitonin 1.30.  COVID-19/RSV/influenza PCR negative.  Respiratory viral panel negative.  Chest x-ray with increased interstitial/patchy airspace disease left mid and bilateral lower lung fields, stable cardiomegaly with central vascular congestion and small pleural effusions. ED physician reported that with the concern for pneumonia and COPD exacerbation patient has been treated with albuterol  nebulizer and broad-spectrum antibiotic has been started.  For hyperkalemia patient has been shifted.  For volume overload and acute exacerbation of CHF ED physician Dr. Melvenia has been consulted nephrology and waiting for response.   Hospitalist has been contacted for management of acute hypoxic respiratory failure in the context of COPD exacerbation, CHF  exacerbation, volume overload, pneumonia and hyperkalemia.  Significant Hospital events: 1/7: Admit to [facility], nephrology consulted.  Per Dr. Geralynn, too unstable for HD on the floor requesting transfer to ICU, PCCM consulted.  Underwent HD and BiPAP removed. 1/8: Transferred to [facility], now on 5 L nasal cannula. 1/9: Continues with shortness of breath, pending HD today 1/10: Continues with dyspnea, back on 2 L nasal cannula at rest but desats to 74% when standing at bedside, will need further HD next planned tomorrow  Assessment & Plan:   Acute on chronic hypoxic respiratory failure, POA Patient presenting to the ED with progressive shortness of breath.  Etiology likely multifactorial in the setting of dietary indiscretions, volume overload, systolic CHF exacerbation, community-acquired pneumonia as well as COPD exacerbation.  Patient initially requiring BiPAP, underwent hemodialysis and placed on empiric antibiotics with Zosyn , IV steroids for COPD exacerbation with improvement of symptoms and BiPAP weaned off back to nasal cannula. -- Continue supplemental oxygen, maintain SpO2 greater than 88% -- Continue treatment as below  Community-acquired pneumonia Chest x-ray with interstitial/patchy airspace disease left mid and bilateral lower lung fields concerning for pneumonia.  Patient is afebrile without leukocytosis but procalcitonin elevated. -- Augmentin  500-125 mg p.o. daily  Acute on chronic systolic congestive heart failure Chest x-ray with pulmonary edema/central vascular congestion at time of admission with elevated BNP.  Etiology likely secondary to dietary indiscretions with noncompliance with amounts of fluid intake. -- Renal diet with 1.2 L fluid restriction daily -- Continue volume management with HD; planned next HD tomorrow -- Strict I's and O's and daily weights.  Hyperkalemia: Potassium 6.5 on admission; underwent HD.  Potassium 5.8 this morning, for HD  today -- Renal function  panel daily  Hyperphosphatemia Phosphorus level >30; etiology secondary to dietary noncompliance -- Velphoro  increased to 1500mg  PO TID; repeat Phos 9.0 this am -- Renal panel daily  ESRD on HD TTS -- Nephrology following, appreciate assistance -- Midodrine  10 mg p.o. 3 times daily  Acute COPD exacerbation -- Prednisone  40 mg p.o. daily -- Brovana  neb twice daily -- Pulmicort  neb twice daily -- Albuterol  neb every 4 hours as needed for wheezing/shortness of breath -- Goal SpO2 >88%  Paroxysmal atrial fibrillation on anticoagulation -- Holding home amiodarone  due to bradycardia -- Eliquis  5 mg p.o. twice daily  History of PE --Continue Eliquis   Type 2 diabetes mellitus Controlled at baseline, hemoglobin A1c 4.8 11/2022, well-controlled. -- SSI for coverage -- CBG before every meal/at bedtime  Weakness/debility/deconditioning: -- PT/OT recommend home health -- Continue therapy efforts while inpatient    DVT prophylaxis: SCDs Start: 01/15/23 0904 SCDs Start: 01/15/23 0452 apixaban  (ELIQUIS ) tablet 5 mg    Code Status: Full Code Family Communication: No family present bedside this morning  Disposition Plan:  Level of care: Telemetry Medical Status is: Inpatient Remains inpatient appropriate because: needs further HD while inpatient, anticipate likely discharge home tomorrow 1/11 following HD    Consultants:  PCCM: Signed off 01/16/2023 Nephrology  Procedures:  None  Antimicrobials:  Zosyn  1/7 - 1/8 Doxycycline  1/7 - 1/7 Ceftriaxone  1/7 - 1/7 Augmentin  1/8>>   Subjective: Patient seen examined bedside, resting comfortable.  Lying in bed.  Oxygen titrated down to 2 L nasal cannula at rest but did desaturate at bedside on standing to 74% yesterday.  4 L UF yesterday on dialysis.  Remains dyspneic.  Plans for further inpatient HD tomorrow with hopefully discharge home thereafter.  Friend at bedside states he is no better than he was at admission, discussed with  friend that he was requiring BiPAP and basically obtunded requiring ICU level care at that time.  Friend also states every time he gets discharged too early.  Discussed with nephrology this morning.   Discussed with patient and family once again needs to avoid excessive amounts of liquid intake with dietary adherence.  No other specific complaints, questions or concerns at this time.  Denies headache, no dizziness, no chest pain, no palpitations, no abdominal pain, no fever/chills/night sweats, no nausea/vomiting/diarrhea, no focal weakness, no fatigue, no paresthesias.  No acute events overnight per nursing staff.  Objective: Vitals:   01/17/23 2119 01/17/23 2242 01/18/23 0429 01/18/23 0908  BP: 125/86  (!) 146/86 (!) 142/70  Pulse: 90 64 69 67  Resp: 18 18 18    Temp: 98.4 F (36.9 C)  98.5 F (36.9 C) 98.7 F (37.1 C)  TempSrc: Oral  Oral Oral  SpO2:  100% 100% 96%  Weight:      Height:        Intake/Output Summary (Last 24 hours) at 01/18/2023 1233 Last data filed at 01/18/2023 0740 Gross per 24 hour  Intake 480 ml  Output 4000 ml  Net -3520 ml   Filed Weights   01/15/23 1937 01/16/23 0500 01/17/23 0500  Weight: 72.6 kg 70 kg 70 kg    Examination:  Physical Exam: GEN: NAD, alert and oriented x 3, chronically ill appearance, appears older than stated age HEENT: NCAT, PERRL, EOMI, sclera clear, MMM PULM: Breath sounds slight diminished bilateral bases, mild crackles/wheezing, normal respiratory effort without accessory muscle use, on 2 L nasal cannula with SpO2 96% at rest (2l [place] baseline) CV: RRR w/o M/G/R GI:  abd soft, NTND, NABS, no R/G/M MSK: no peripheral edema, moves all extremities independently NEURO: CN II-XII intact, no focal deficits, sensation to light touch intact PSYCH: normal mood/affect Integumentary: No concerning rashes/lesions/wounds noted on exposed skin surfaces.    Data Reviewed: I have personally reviewed following labs and imaging  studies  CBC: Recent Labs  Lab 01/15/23 0325 01/15/23 0332 01/15/23 0524 01/15/23 0534 01/15/23 0923 01/16/23 1200 01/17/23 0415 01/18/23 0657  WBC 8.6  --  7.9  --   --  7.0 8.0 6.9  NEUTROABS 5.8  --   --   --   --   --   --   --   HGB 12.1*   < > 12.3* 14.3 12.2* 10.6* 10.5* 12.1*  HCT 40.2   < > 41.1 42.0 36.0* 34.9* 34.8* 39.7  MCV 85.2  --  84.9  --   --  84.5 83.3 83.6  PLT 85*  --  95*  --   --  87* 95* 98*   < > = values in this interval not displayed.   Basic Metabolic Panel: Recent Labs  Lab 01/15/23 0325 01/15/23 0332 01/15/23 0516 01/15/23 0524 01/15/23 0534 01/15/23 0923 01/16/23 1200 01/17/23 0417 01/18/23 0657  NA 137   < >  --  138 136 134* 134* 135 135  K 6.5*   < >  --  6.1* 5.9* 6.1* 5.1 5.8* 4.9  CL 96*  --   --  96*  --   --  93* 95* 94*  CO2 22  --   --  23  --   --  23 21* 24  GLUCOSE 127*  --   --  131*  --   --  110* 75 111*  BUN 78*  --   --  79*  --   --  50* 63* 58*  CREATININE 10.10*  --   --  10.09*  --   --  7.13* 8.56* 7.13*  CALCIUM  8.7*  --   --  8.7*  --   --  8.6* 8.2* 8.8*  PHOS  --   --  11.2*  --   --   --   --  >30.0* 9.0*   < > = values in this interval not displayed.   GFR: Estimated Creatinine Clearance: 10.9 mL/min (A) (by C-G formula based on SCr of 7.13 mg/dL (H)). Liver Function Tests: Recent Labs  Lab 01/15/23 0325 01/15/23 0524 01/17/23 0417 01/18/23 0657  AST 27 26  --   --   ALT 35 36  --   --   ALKPHOS 107 112  --   --   BILITOT 0.7 0.8  --   --   PROT 7.5 7.8  --   --   ALBUMIN  2.3* 2.3* 2.3* 2.4*   No results for input(s): LIPASE, AMYLASE in the last 168 hours. No results for input(s): AMMONIA in the last 168 hours. Coagulation Profile: No results for input(s): INR, PROTIME in the last 168 hours. Cardiac Enzymes: No results for input(s): CKTOTAL, CKMB, CKMBINDEX, TROPONINI in the last 168 hours. BNP (last 3 results) No results for input(s): PROBNP in the last 8760  hours. HbA1C: No results for input(s): HGBA1C in the last 72 hours. CBG: Recent Labs  Lab 01/17/23 0733 01/17/23 1131 01/17/23 2117 01/18/23 0732 01/18/23 1142  GLUCAP 114* 82 100* 122* 141*   Lipid Profile: No results for input(s): CHOL, HDL, LDLCALC, TRIG, CHOLHDL, LDLDIRECT in the last 72 hours. Thyroid  Function Tests: No results for input(s): TSH, T4TOTAL, FREET4, T3FREE, THYROIDAB in the last 72 hours. Anemia Panel: No results for input(s): VITAMINB12, FOLATE, FERRITIN, TIBC, IRON, RETICCTPCT in the last 72 hours. Sepsis Labs: Recent Labs  Lab 01/15/23 0524 01/16/23 1200  PROCALCITON 1.30 1.85    Recent Results (from the past 240 hours)  Resp panel by RT-PCR (RSV, Flu A&B, Covid) Anterior Nasal Swab     Status: None   Collection Time: 01/15/23  3:25 AM   Specimen: Anterior Nasal Swab  Result Value Ref Range Status   SARS Coronavirus 2 by RT PCR NEGATIVE NEGATIVE Final   Influenza A by PCR NEGATIVE NEGATIVE Final   Influenza B by PCR NEGATIVE NEGATIVE Final    Comment: (NOTE) The Xpert Xpress SARS-CoV-2/FLU/RSV plus assay is intended as an aid in the diagnosis of influenza from Nasopharyngeal swab specimens and should not be used as a sole basis for treatment. Nasal washings and aspirates are unacceptable for Xpert Xpress SARS-CoV-2/FLU/RSV testing.  Fact Sheet for Patients: bloggercourse.com  Fact Sheet for Healthcare Providers: seriousbroker.it  This test is not yet approved or cleared by the [place]  FDA and has been authorized for detection and/or diagnosis of SARS-CoV-2 by FDA under an Emergency Use Authorization (EUA). This EUA will remain in effect (meaning this test can be used) for the duration of the COVID-19 declaration under Section 564(b)(1) of the Act, 21 U.S.C. section 360bbb-3(b)(1), unless the authorization is terminated or revoked.     Resp Syncytial  Virus by PCR NEGATIVE NEGATIVE Final    Comment: (NOTE) Fact Sheet for Patients: bloggercourse.com  Fact Sheet for Healthcare Providers: seriousbroker.it  This test is not yet approved or cleared by the [place]  FDA and has been authorized for detection and/or diagnosis of SARS-CoV-2 by FDA under an Emergency Use Authorization (EUA). This EUA will remain in effect (meaning this test can be used) for the duration of the COVID-19 declaration under Section 564(b)(1) of the Act, 21 U.S.C. section 360bbb-3(b)(1), unless the authorization is terminated or revoked.  Performed at [facility], [address] [place], [place] [address]   Respiratory (~20 pathogens) panel by PCR     Status: None   Collection Time: 01/15/23  3:25 AM   Specimen: Nasopharyngeal Swab; Respiratory  Result Value Ref Range Status   Adenovirus NOT DETECTED NOT DETECTED Final   Coronavirus 229E NOT DETECTED NOT DETECTED Final    Comment: (NOTE) The Coronavirus on the Respiratory Panel, DOES NOT test for the novel  Coronavirus (2019 nCoV)    Coronavirus HKU1 NOT DETECTED NOT DETECTED Final   Coronavirus NL63 NOT DETECTED NOT DETECTED Final   Coronavirus OC43 NOT DETECTED NOT DETECTED Final   Metapneumovirus NOT DETECTED NOT DETECTED Final   Rhinovirus / Enterovirus NOT DETECTED NOT DETECTED Final   Influenza A NOT DETECTED NOT DETECTED Final   Influenza B NOT DETECTED NOT DETECTED Final   Parainfluenza Virus 1 NOT DETECTED NOT DETECTED Final   Parainfluenza Virus 2 NOT DETECTED NOT DETECTED Final   Parainfluenza Virus 3 NOT DETECTED NOT DETECTED Final   Parainfluenza Virus 4 NOT DETECTED NOT DETECTED Final   Respiratory Syncytial Virus NOT DETECTED NOT DETECTED Final   Bordetella pertussis NOT DETECTED NOT DETECTED Final   Bordetella Parapertussis NOT DETECTED NOT DETECTED Final   Chlamydophila pneumoniae NOT DETECTED NOT DETECTED Final   Mycoplasma  pneumoniae NOT DETECTED NOT DETECTED Final    Comment: Performed at [facility], [address] [place], [place] [address]  Culture, blood (routine x 2) Call MD if unable to obtain prior to antibiotics being given     Status: None (Preliminary result)   Collection Time: 01/15/23  5:16 AM   Specimen: BLOOD RIGHT ARM  Result Value Ref Range Status   Specimen Description BLOOD RIGHT ARM  Final   Special Requests   Final    BOTTLES DRAWN AEROBIC AND ANAEROBIC Blood Culture results may not be optimal due to an inadequate volume of blood received in culture bottles   Culture   Final    NO GROWTH 3 DAYS Performed at [facility], [address] [place], [place] [address]    Report Status PENDING  Incomplete  Culture, blood (routine x 2) Call MD if unable to obtain prior to antibiotics being given     Status: None (Preliminary result)   Collection Time: 01/15/23  5:24 AM   Specimen: BLOOD RIGHT ARM  Result Value Ref Range Status   Specimen Description BLOOD RIGHT ARM  Final   Special Requests   Final    BOTTLES DRAWN AEROBIC AND ANAEROBIC Blood Culture adequate volume   Culture   Final    NO GROWTH 3 DAYS Performed at [facility], [address], [place], [place] [address]    Report Status PENDING  Incomplete  MRSA Next Gen by PCR, Nasal     Status: None   Collection Time: 01/15/23 10:32 AM   Specimen: Nasal Mucosa; Nasal Swab  Result Value Ref Range Status   MRSA by PCR Next Gen NOT DETECTED NOT DETECTED Final    Comment: (NOTE) The GeneXpert MRSA Assay (FDA approved for NASAL specimens only), is one component of a comprehensive MRSA colonization surveillance program. It is not intended to diagnose MRSA infection nor to guide or monitor treatment for MRSA infections. Test performance is not FDA approved in patients less than 69 years old. Performed at [facility], [address] [place], [place] [address]   Culture, blood (Routine X 2) w Reflex to ID  Panel     Status: None (Preliminary result)   Collection Time: 01/15/23 11:00 AM   Specimen: BLOOD RIGHT ARM  Result Value Ref Range Status   Specimen Description BLOOD RIGHT ARM  Final   Special Requests   Final    BOTTLES DRAWN AEROBIC AND ANAEROBIC Blood Culture results may not be optimal due to an inadequate volume of blood received in culture bottles   Culture   Final    NO GROWTH 3 DAYS Performed at [facility], [address], [place], [place] [address]    Report Status PENDING  Incomplete  Culture, blood (Routine X 2) w Reflex to ID Panel     Status: None (Preliminary result)   Collection Time: 01/15/23 11:14 AM   Specimen: BLOOD RIGHT ARM  Result Value Ref Range Status   Specimen Description BLOOD RIGHT ARM  Final   Special Requests   Final    BOTTLES DRAWN AEROBIC AND ANAEROBIC Blood Culture results may not be optimal due to an inadequate volume of blood received in culture bottles   Culture   Final    NO GROWTH 3 DAYS Performed at [facility], [address] [place], [place] [address]    Report Status PENDING  Incomplete         Radiology Studies: No results found.       Scheduled Meds:  (feeding supplement) PROSource  Plus  30 mL Oral BID BM   amoxicillin -clavulanate  1 tablet Oral Daily   apixaban   5 mg Oral BID   arformoterol   15 mcg Nebulization BID   budesonide  (PULMICORT ) nebulizer solution  0.5 mg Nebulization BID   Chlorhexidine  Gluconate Cloth  6 each Topical Q0600   doxercalciferol   2 mcg Intravenous Q T,Th,Sa-HD   insulin  aspart  0-5 Units Subcutaneous QHS   insulin  aspart  0-6 Units Subcutaneous TID WC   midodrine   10 mg Oral TID WC   predniSONE   40 mg Oral Q breakfast   sucroferric oxyhydroxide  1,500 mg Oral TID WC   Continuous Infusions:     LOS: 3 days    Time spent: 52 minutes spent on chart review, discussion with nursing staff, consultants, updating family and interview/physical exam; more than 50% of that time was  spent in counseling and/or coordination of care.    Camellia PARAS Ednamae Schiano, DO [facility] Available via Epic secure chat 7am-7pm After these hours, please refer to coverage provider listed on [email or web address] 01/18/2023, 12:33 PM

## 2023-01-18 NOTE — Progress Notes (Signed)
 [facility] Progress Note   Subjective:   seen in room - resting comfortably in bed. Will get HD tomorrow. Patient reports breathing has improved after HD yesterday, 4L were removed. Pt back down to baseline 2L O2 via nasal cannula. Reports he still feels slightly overloaded but better overall. Discussed continued diet and fluid restrictions.   Objective Vitals:   01/17/23 2119 01/17/23 2242 01/18/23 0429 01/18/23 0908  BP: 125/86  (!) 146/86 (!) 142/70  Pulse: 90 64 69 67  Resp: 18 18 18    Temp: 98.4 F (36.9 C)  98.5 F (36.9 C) 98.7 F (37.1 C)  TempSrc: Oral  Oral Oral  SpO2:  100% 100% 96%  Weight:      Height:       Physical Exam General: Chronically ill appearing man, NAD. Nasal O2 in place. Heart: RRR; no murmur Lungs: No rales. Significant wheezing in upper lung fields.  Abdomen: soft, slightly distended Extremities: 1+ LE edema Dialysis Access: [facility]  Additional Objective Labs: Basic Metabolic Panel: Recent Labs  Lab 01/15/23 0516 01/15/23 0524 01/16/23 1200 01/17/23 0417 01/18/23 0657  NA  --    < > 134* 135 135  K  --    < > 5.1 5.8* 4.9  CL  --    < > 93* 95* 94*  CO2  --    < > 23 21* 24  GLUCOSE  --    < > 110* 75 111*  BUN  --    < > 50* 63* 58*  CREATININE  --    < > 7.13* 8.56* 7.13*  CALCIUM   --    < > 8.6* 8.2* 8.8*  PHOS 11.2*  --   --  >30.0* 9.0*   < > = values in this interval not displayed.   Liver Function Tests: Recent Labs  Lab 01/15/23 0325 01/15/23 0524 01/17/23 0417 01/18/23 0657  AST 27 26  --   --   ALT 35 36  --   --   ALKPHOS 107 112  --   --   BILITOT 0.7 0.8  --   --   PROT 7.5 7.8  --   --   ALBUMIN  2.3* 2.3* 2.3* 2.4*    CBC: Recent Labs  Lab 01/15/23 0325 01/15/23 0332 01/15/23 0524 01/15/23 0534 01/16/23 1200 01/17/23 0415 01/18/23 0657  WBC 8.6  --  7.9  --  7.0 8.0 6.9  NEUTROABS 5.8  --   --   --   --   --   --   HGB 12.1*   < > 12.3*   < > 10.6* 10.5* 12.1*  HCT 40.2   < > 41.1    < > 34.9* 34.8* 39.7  MCV 85.2  --  84.9  --  84.5 83.3 83.6  PLT 85*  --  95*  --  87* 95* 98*   < > = values in this interval not displayed.   Blood Culture    Component Value Date/Time   SDES BLOOD RIGHT ARM 01/15/2023 1114   SPECREQUEST  01/15/2023 1114    BOTTLES DRAWN AEROBIC AND ANAEROBIC Blood Culture results may not be optimal due to an inadequate volume of blood received in culture bottles   CULT  01/15/2023 1114    NO GROWTH 3 DAYS Performed at [facility], [address], [place], [place] [address]    REPTSTATUS PENDING 01/15/2023 1114    CBG: Recent Labs  Lab 01/16/23 2142  01/17/23 0733 01/17/23 1131 01/17/23 2117 01/18/23 0732  GLUCAP 105* 114* 82 100* 122*   Medications:   (feeding supplement) PROSource Plus  30 mL Oral BID BM   amoxicillin -clavulanate  1 tablet Oral Daily   apixaban   5 mg Oral BID   arformoterol   15 mcg Nebulization BID   budesonide  (PULMICORT ) nebulizer solution  0.5 mg Nebulization BID   Chlorhexidine  Gluconate Cloth  6 each Topical Q0600   doxercalciferol   2 mcg Intravenous Q T,Th,Sa-HD   insulin  aspart  0-5 Units Subcutaneous QHS   insulin  aspart  0-6 Units Subcutaneous TID WC   midodrine   10 mg Oral TID WC   predniSONE   40 mg Oral Q breakfast   sucroferric oxyhydroxide  1,500 mg Oral TID WC    Dialysis Orders TTS - AF 4:15hr, 400/500, EDW 67.5kg, 2K/2Ca, TDC, no heparin  - Mircera 50mcg Iv q 2 weeks (last 12/26) - Hectoral 2mcg Iv q HD - Parsabiv 7.5mg  IV q HD   Assessment/Plan: AHRF/pulm edema + COPD exacerbation + presumed pneumonia: Required BiPAP on admit, now weaned to [place] O2. Initially on Zosyn , transitioned to Augmentin . Continuing steroids. ESRD: Usual TTS schedule. Next HD tomorrow. K+ normalized post HD. HypoTN/volume: BP stable on mido TID, still with pulm edema/effusion -> max UF tomorrow, trying for 4L. Per patient, cramps severely with >3L UF goal. Anemia of ESRD: Hgb 12.1 - no ESA needed at this  time Secondary HPTH: Ca ok, Phos 9 down from 30 post HD - continue Velphoro  at increased dose of 1500 mg TID. Continue Hectoral with HD. Nutrition: Alb low, adding supplements.  Signe Sick, PA-S Izetta Boehringer, PA-C 01/18/2023, 10:17 AM  [employer]

## 2023-01-18 NOTE — Plan of Care (Signed)
  Problem: Education: Goal: Ability to describe self-care measures that may prevent or decrease complications (Diabetes Survival Skills Education) will improve Outcome: Progressing Goal: Individualized Educational Video(s) Outcome: Progressing   Problem: Coping: Goal: Ability to adjust to condition or change in health will improve Outcome: Progressing   Problem: Fluid Volume: Goal: Ability to maintain a balanced intake and output will improve Outcome: Progressing   Problem: Health Behavior/Discharge Planning: Goal: Ability to identify and utilize available resources and services will improve Outcome: Progressing Goal: Ability to manage health-related needs will improve Outcome: Progressing   Problem: Metabolic: Goal: Ability to maintain appropriate glucose levels will improve Outcome: Progressing   Problem: Nutritional: Goal: Maintenance of adequate nutrition will improve Outcome: Progressing Goal: Progress toward achieving an optimal weight will improve Outcome: Progressing   Problem: Skin Integrity: Goal: Risk for impaired skin integrity will decrease Outcome: Progressing   Problem: Tissue Perfusion: Goal: Adequacy of tissue perfusion will improve Outcome: Progressing   Problem: Education: Goal: Ability to demonstrate management of disease process will improve Outcome: Progressing Goal: Ability to verbalize understanding of medication therapies will improve Outcome: Progressing Goal: Individualized Educational Video(s) Outcome: Progressing   Problem: Activity: Goal: Capacity to carry out activities will improve Outcome: Progressing   Problem: Cardiac: Goal: Ability to achieve and maintain adequate cardiopulmonary perfusion will improve Outcome: Progressing   Problem: Education: Goal: Knowledge of disease or condition will improve Outcome: Progressing Goal: Knowledge of the prescribed therapeutic regimen will improve Outcome: Progressing Goal:  Individualized Educational Video(s) Outcome: Progressing   Problem: Activity: Goal: Ability to tolerate increased activity will improve Outcome: Progressing Goal: Will verbalize the importance of balancing activity with adequate rest periods Outcome: Progressing   Problem: Respiratory: Goal: Ability to maintain a clear airway will improve Outcome: Progressing Goal: Levels of oxygenation will improve Outcome: Progressing Goal: Ability to maintain adequate ventilation will improve Outcome: Progressing   Problem: Activity: Goal: Ability to tolerate increased activity will improve Outcome: Progressing   Problem: Clinical Measurements: Goal: Ability to maintain a body temperature in the normal range will improve Outcome: Progressing   Problem: Respiratory: Goal: Ability to maintain adequate ventilation will improve Outcome: Progressing Goal: Ability to maintain a clear airway will improve Outcome: Progressing   Problem: Education: Goal: Knowledge of General Education information will improve Description: Including pain rating scale, medication(s)/side effects and non-pharmacologic comfort measures Outcome: Progressing   Problem: Health Behavior/Discharge Planning: Goal: Ability to manage health-related needs will improve Outcome: Progressing   Problem: Clinical Measurements: Goal: Ability to maintain clinical measurements within normal limits will improve Outcome: Progressing Goal: Will remain free from infection Outcome: Progressing Goal: Diagnostic test results will improve Outcome: Progressing Goal: Respiratory complications will improve Outcome: Progressing Goal: Cardiovascular complication will be avoided Outcome: Progressing   Problem: Activity: Goal: Risk for activity intolerance will decrease Outcome: Progressing   Problem: Nutrition: Goal: Adequate nutrition will be maintained Outcome: Progressing   Problem: Coping: Goal: Level of anxiety will  decrease Outcome: Progressing   Problem: Elimination: Goal: Will not experience complications related to bowel motility Outcome: Progressing Goal: Will not experience complications related to urinary retention Outcome: Progressing   Problem: Pain Management: Goal: General experience of comfort will improve Outcome: Progressing   Problem: Safety: Goal: Ability to remain free from injury will improve Outcome: Progressing   Problem: Skin Integrity: Goal: Risk for impaired skin integrity will decrease Outcome: Progressing

## 2023-01-18 NOTE — TOC CM/SW Note (Signed)
 Transition of Care [facility]) - Inpatient Brief Assessment   Patient Details  Name: Nathaniel White MRN: [id number] Date of Birth: [birth year]  Transition of Care [facility]) CM/SW Contact:    Tom-Johnson, Harvest Muskrat, RN Phone Number: 01/18/2023, 2:38 PM   Clinical Narrative:  Patient presented to the ED with worsening difficulty breathing.  Patient is on 2L O2 chronically.  Patient goes to outpatient Dialysis on a TTS schedule. Nephrology following for inpatient HD.  From home with room mates. Has supportive friends and family. Has necessary DME's at home. Home O2 from [employer].  Home health referral sent to [facility] and Jon noted acceptance, info on AVS. Patient has no preference.  PCP is Theotis Haze ORN, NP and uses [facility].   Alyse Cramp Ruffins to transport at discharge.  CM will continue to follow.        Transition of Care Asessment: Insurance and Status: Insurance coverage has been reviewed Patient has primary care physician: Yes Home environment has been reviewed: Yes Prior level of function:: Modified Independent Prior/Current Home Services: No current home services Social Drivers of Health Review: SDOH reviewed no interventions necessary Readmission risk has been reviewed: Yes Transition of care needs: transition of care needs identified, TOC will continue to follow

## 2023-01-19 DIAGNOSIS — J9621 Acute and chronic respiratory failure with hypoxia: Secondary | ICD-10-CM | POA: Diagnosis not present

## 2023-01-19 LAB — CBC
HCT: 39.7 % (ref 39.0–52.0)
Hemoglobin: 12.2 g/dL — ABNORMAL LOW (ref 13.0–17.0)
MCH: 25.3 pg — ABNORMAL LOW (ref 26.0–34.0)
MCHC: 30.7 g/dL (ref 30.0–36.0)
MCV: 82.2 fL (ref 80.0–100.0)
Platelets: 98 10*3/uL — ABNORMAL LOW (ref 150–400)
RBC: 4.83 MIL/uL (ref 4.22–5.81)
RDW: 17.8 % — ABNORMAL HIGH (ref 11.5–15.5)
WBC: 8.4 10*3/uL (ref 4.0–10.5)
nRBC: 0 % (ref 0.0–0.2)

## 2023-01-19 LAB — RENAL FUNCTION PANEL
Albumin: 2.4 g/dL — ABNORMAL LOW (ref 3.5–5.0)
Anion gap: 20 — ABNORMAL HIGH (ref 5–15)
BUN: 103 mg/dL — ABNORMAL HIGH (ref 6–20)
CO2: 21 mmol/L — ABNORMAL LOW (ref 22–32)
Calcium: 8.9 mg/dL (ref 8.9–10.3)
Chloride: 94 mmol/L — ABNORMAL LOW (ref 98–111)
Creatinine, Ser: 9.53 mg/dL — ABNORMAL HIGH (ref 0.61–1.24)
GFR, Estimated: 6 mL/min — ABNORMAL LOW (ref >60)
Glucose, Bld: 99 mg/dL (ref 70–99)
Phosphorus: 11.2 mg/dL — ABNORMAL HIGH (ref 2.5–4.6)
Potassium: 5.3 mmol/L — ABNORMAL HIGH (ref 3.5–5.1)
Sodium: 135 mmol/L (ref 135–145)

## 2023-01-19 LAB — GLUCOSE, CAPILLARY: Glucose-Capillary: 117 mg/dL — ABNORMAL HIGH (ref 70–99)

## 2023-01-19 MED ORDER — AMOXICILLIN-POT CLAVULANATE 500-125 MG PO TABS
1.0000 | ORAL_TABLET | Freq: Once | ORAL | Status: AC
  Administered 2023-01-19: 1 via ORAL
  Filled 2023-01-19: qty 1

## 2023-01-19 MED ORDER — HEPARIN SODIUM (PORCINE) 1000 UNIT/ML IJ SOLN
INTRAMUSCULAR | Status: AC
  Filled 2023-01-19: qty 5

## 2023-01-19 MED ORDER — HEPARIN SODIUM (PORCINE) 1000 UNIT/ML DIALYSIS
1000.0000 [IU] | INTRAMUSCULAR | Status: DC | PRN
  Administered 2023-01-19: 4200 [IU]

## 2023-01-19 NOTE — Discharge Planning (Signed)
 [facility] Patient Discharge Orders- [facility] CLINIC: SW  Patient's name: Nathaniel White Admit/DC Dates: 01/15/2023 - 01/19/23  Discharge Diagnoses: Acute hypoxic respiratory failure 2/2 COPD exacerbation + pulmonary edema   CAP -completed ABX.  Aranesp : Given: no   Last Hgb: 12.2 PRBC's Given: no  ESA dose for discharge: no change IV Iron dose at discharge: no  Heparin  change: no change  EDW Change: no  Bath Change: no  Access intervention/Change: no Details:  Hectorol /Calcitriol change: no  Discharge Labs: Calcium  8.9 Phosphorus 11.2 Albumin  2.4 K+ 5.3  IV Antibiotics: no Details:  On Coumadin?: no Last INR: Next INR: Managed By:   OTHER/APPTS/LAB ORDERS: Velphoro  increased to 3AC TID     D/C Meds to be reconciled by nurse after every discharge.  Completed By: Manuelita Labella, PA-C   Reviewed by: MD:______ RN_______

## 2023-01-19 NOTE — Progress Notes (Signed)
 [facility] Progress Note   Subjective:   Patient seen and examined at bedside during dialysis.  Tolerating treatment well for far.  Breathing better and close to baseline O2.  Denies CP, abdominal pain and n/v/d.  Plan to d/c post HD today.   Objective Vitals:   01/19/23 0925 01/19/23 0930 01/19/23 1000 01/19/23 1030  BP:  115/73 116/79 138/65  Pulse:  75 87 92  Resp:  14 14 15   Temp:      TempSrc:      SpO2:  100% 100% 100%  Weight: 70 kg     Height:       Physical Exam General: chronically ill appearing male in NAD Heart:RRR, no mrg Lungs:crackles in RLL, nml WOB in 3L O2 Abdomen:soft, NTND Extremities:trace LE edema Dialysis Access: L [facility]   Filed Weights   01/17/23 0500 01/19/23 0500 01/19/23 0925  Weight: 70 kg 70.1 kg 70 kg    Intake/Output Summary (Last 24 hours) at 01/19/2023 1100 Last data filed at 01/19/2023 0700 Gross per 24 hour  Intake 240 ml  Output 0 ml  Net 240 ml    Additional Objective Labs: Basic Metabolic Panel: Recent Labs  Lab 01/17/23 0417 01/18/23 0657 01/19/23 0515  NA 135 135 135  K 5.8* 4.9 5.3*  CL 95* 94* 94*  CO2 21* 24 21*  GLUCOSE 75 111* 99  BUN 63* 58* 103*  CREATININE 8.56* 7.13* 9.53*  CALCIUM  8.2* 8.8* 8.9  PHOS >30.0* 9.0* 11.2*   Liver Function Tests: Recent Labs  Lab 01/15/23 0325 01/15/23 0524 01/17/23 0417 01/18/23 0657 01/19/23 0515  AST 27 26  --   --   --   ALT 35 36  --   --   --   ALKPHOS 107 112  --   --   --   BILITOT 0.7 0.8  --   --   --   PROT 7.5 7.8  --   --   --   ALBUMIN  2.3* 2.3* 2.3* 2.4* 2.4*    CBC: Recent Labs  Lab 01/15/23 0325 01/15/23 0332 01/15/23 0524 01/15/23 0534 01/16/23 1200 01/17/23 0415 01/18/23 0657 01/19/23 0515  WBC 8.6  --  7.9  --  7.0 8.0 6.9 8.4  NEUTROABS 5.8  --   --   --   --   --   --   --   HGB 12.1*   < > 12.3*   < > 10.6* 10.5* 12.1* 12.2*  HCT 40.2   < > 41.1   < > 34.9* 34.8* 39.7 39.7  MCV 85.2  --  84.9  --  84.5 83.3 83.6 82.2   PLT 85*  --  95*  --  87* 95* 98* 98*   < > = values in this interval not displayed.   Blood Culture    Component Value Date/Time   SDES BLOOD RIGHT ARM 01/15/2023 1114   SPECREQUEST  01/15/2023 1114    BOTTLES DRAWN AEROBIC AND ANAEROBIC Blood Culture results may not be optimal due to an inadequate volume of blood received in culture bottles   CULT  01/15/2023 1114    NO GROWTH 4 DAYS Performed at [facility], [address] [place], [place] [address]    REPTSTATUS PENDING 01/15/2023 1114    CBG: Recent Labs  Lab 01/18/23 0732 01/18/23 1142 01/18/23 1619 01/18/23 2049 01/19/23 0727  GLUCAP 122* 141* 131* 149* 117*    Medications:   (feeding supplement) PROSource Plus  30 mL Oral BID BM   amoxicillin -clavulanate  1 tablet Oral Once   apixaban   5 mg Oral BID   arformoterol   15 mcg Nebulization BID   budesonide  (PULMICORT ) nebulizer solution  0.5 mg Nebulization BID   Chlorhexidine  Gluconate Cloth  6 each Topical Q0600   doxercalciferol   2 mcg Intravenous Q T,Th,Sa-HD   insulin  aspart  0-5 Units Subcutaneous QHS   insulin  aspart  0-6 Units Subcutaneous TID WC   midodrine   10 mg Oral TID WC   sucroferric oxyhydroxide  1,500 mg Oral TID WC    Dialysis Orders: TTS - AF 4:15hr, 400/500, EDW 67.5kg, 2K/2Ca, TDC, no heparin  - Mircera 50mcg Iv q 2 weeks (last 12/26) - Hectoral 2mcg Iv q HD - Parsabiv 7.5mg  IV q HD   Assessment/Plan: AHRF/pulm edema + COPD exacerbation + presumed pneumonia: Required BiPAP on admit, now weaned to  O2 - close to baseline. Initially on Zosyn , transitioned to Augmentin  - course complete. Continuing steroids.   ESRD: Usual TTS schedule. HD today per regular schedule. Next HD 1/14.  HypoTN/volume: BP stable on mido TID. Volume status improving, UFG 4L today. Tolerating so far. Anemia of ESRD: Hgb 12.1 - no ESA needed at this time Secondary HPTH: Ca ok, Phos 11, Velphoro  increased to 1500mg  AC on 1/9. Continue VDRA. Nutrition: Alb  low, continue supplements.  Afib - amiodarone  held d/t bradycardia.   Manuelita Labella, PA-C [facility] 01/19/2023,11:00 AM  LOS: 4 days

## 2023-01-19 NOTE — TOC Transition Note (Signed)
 Transition of Care [facility]) - Discharge Note   Patient Details  Name: [name] MRN: [id number] Date of Birth: [birth year]  Transition of Care [facility]) CM/SW Contact:  Robynn Eileen Hoose, RN Phone Number: 01/19/2023, 1:49 PM   Clinical Narrative:    Patient is being discharged today. Angie with [facility] made aware.   Final next level of care: Home w Home Health Services Barriers to Discharge: No Barriers Identified   Patient Goals and CMS Choice            Discharge Placement                       Discharge Plan and Services Additional resources added to the After Visit Summary for                                       Social Drivers of Health (SDOH) Interventions SDOH Screenings   Food Insecurity: No Food Insecurity (01/17/2023)  Housing: Low Risk  (01/17/2023)  Transportation Needs: No Transportation Needs (01/16/2023)  Utilities: Not At Risk (01/16/2023)  Alcohol  Screen: Low Risk  (07/18/2022)  Depression (PHQ2-9): Low Risk  (07/18/2022)  Financial Resource Strain: Low Risk  (07/18/2022)  Physical Activity: Inactive (07/18/2022)  Social Connections: Moderately Isolated (07/18/2022)  Stress: No Stress Concern Present (07/18/2022)  Tobacco Use: Medium Risk (01/15/2023)     Readmission Risk Interventions    09/15/2021   10:09 AM 09/06/2021    4:20 PM 06/23/2021    3:52 PM  Readmission Risk Prevention Plan  Transportation Screening Complete Complete Complete  PCP or Specialist Appt within 3-5 Days Complete Complete Complete  HRI or Home Care Consult Complete Complete Complete  Social Work Consult for Recovery Care Planning/Counseling Complete Complete Complete  Palliative Care Screening Not Applicable Not Applicable Not Applicable  Medication Review Oceanographer) Complete Complete Complete

## 2023-01-19 NOTE — Progress Notes (Signed)
 PT Cancellation Note  Patient Details Name: Nathaniel White MRN: [id number] DOB: 07-19-1960   Cancelled Treatment:    Reason Eval/Treat Not Completed: Patient at procedure or test/unavailable. Pt in HD.   Rodgers ORN [facility] 01/19/2023, 8:46 AM Rodgers Opal PT Acute [employer] [phone]

## 2023-01-19 NOTE — Discharge Summary (Addendum)
 Physician Discharge Summary  Nathaniel White FMW:[id number] DOB: May 05, 1960 DOA: 01/15/2023  PCP: Theotis Haze ORN, NP  Admit date: 01/15/2023 Discharge date: 01/19/2023  Admitted From: Home Disposition:  Home  Discharge Condition:Stable CODE STATUS:FULL Diet recommendation: Renal   Brief/Interim Summary: Patient  is a 63 y.o. male with past medical history significant for chronic hypoxic respiratory failure/COPD on 2 L nasal cannula at baseline, ESRD on HD TTS, paroxysmal atrial fibrillation on Eliquis , chronic systolic congestive heart failure, cirrhosis, anemia of chronic medical/renal disease, history of PE, history of CVA, diet-controlled type 2 diabetes mellitus, who presented to [facility] ED on 01/15/2023 via EMS from home with progressive shortness of breath.  Family reports compliance with hemodialysis.  On EMS arrival, patient was oxygenating 81% on 2 L noted, and placed on CPAP, with improvement of symptoms and transported to ED for further evaluation management. Respiratory viral panel negative. Chest x-ray with increased interstitial/patchy airspace disease left mid and bilateral lower lung fields, stable cardiomegaly with central vascular congestion and small pleural effusions.  Patient was admitted for the management of acute hypoxic respiratory failure in the context of COPD exacerbation, CHF exacerbation, volume overload, pneumonia and hyperkalemia.  Nephrology was following.  Started on antibiotics for possible community-acquired pneumonia.  Also started on steroids for COPD exacerbation.  Currently has clinically improved but remains significantly deconditioned due to his comorbidities.  On baseline requirement of oxygen .Physical therapy recommended home with on discharge.  Medically stable for discharge today after dialysis  Following problems were addressed during the hospitalization:  Acute on chronic hypoxic respiratory failure Patient presented to the ED with progressive shortness of  breath.  Etiology likely multifactorial in the setting of dietary indiscretions, volume overload, systolic CHF exacerbation, community-acquired pneumonia as well as COPD exacerbation.  Patient initially requiring BiPAP, underwent hemodialysis and placed on empiric antibiotics with Zosyn , IV steroids for COPD exacerbation with improvement of symptoms and BiPAP weaned off back to nasal cannula. Currently on baseline oxygen requirement.   Community-acquired pneumonia Chest x-ray with interstitial/patchy airspace disease left mid and bilateral lower lung fields concerning for pneumonia.  Patient is afebrile without leukocytosis but procalcitonin elevated. Completed the course of Augmentin    Acute on chronic systolic congestive heart failure Chest x-ray with pulmonary edema/central vascular congestion at time of admission with elevated BNP.  Etiology likely secondary to dietary indiscretions with noncompliance with amounts of fluid intake. Volume was managed with dialysis  Hyperkalemia: Potassium 6.5 on admission; underwent HD.  Potassium 5.3 this morning, for HD today   Hyperphosphatemia Phosphorus level >30; etiology secondary to dietary noncompliance Continue Velphoro  on discharge   ESRD on HD TTS -Underwent regular dialysis here -- Midodrine  10 mg p.o. 3 times daily   Acute COPD exacerbation No wheezing today.  Steroids changed to oral   Paroxysmal atrial fibrillation on anticoagulation -- Holding home amiodarone  due to bradycardia -- Eliquis  5 mg p.o. twice daily   History of PE --Continue Eliquis    Type 2 diabetes mellitus Controlled at baseline, hemoglobin A1c 4.8 11/2022, well-controlled.   Weakness/debility/deconditioning: -- PT/OT recommend home health      Discharge Diagnoses:  Principal Problem:   Acute on chronic hypoxic respiratory failure (HCC) Active Problems:   ESRD on dialysis (HCC)   COPD with acute exacerbation (HCC)   Volume overload   Elevated  troponin   Acute exacerbation of CHF (congestive heart failure) (HCC)   Paroxysmal atrial fibrillation (HCC)   History of pulmonary embolism   Chronic hypotension   Non-insulin   dependent type 2 diabetes mellitus (HCC)   CAP (community acquired pneumonia)   Hyperkalemia   Chronic ITP (idiopathic thrombocytopenia) (HCC)   Acute hypoxic respiratory failure (HCC)   Respiratory failure (HCC)    Discharge Instructions  Discharge Instructions     Diet - low sodium heart healthy   Complete by: As directed    Discharge instructions   Complete by: As directed    1)Please take your medications as instructed 2)Follow up with your PCP in a week   Increase activity slowly   Complete by: As directed    No wound care   Complete by: As directed       Allergies as of 01/19/2023       Reactions   Codeine Shortness Of Breath   Dextromethorphan-guaifenesin  Shortness Of Breath, Other (See Comments)   Iron Dextran Shortness Of Breath, Other (See Comments)   Morphine  And Codeine Shortness Of Breath   Other Reaction(s): Other (See Comments)        Medication List     TAKE these medications    acetaminophen  500 MG tablet Commonly known as: TYLENOL  Take 1 tablet (500 mg total) by mouth every 6 (six) hours as needed. What changed: reasons to take this   albuterol  108 (90 Base) MCG/ACT inhaler Commonly known as: ProAir  HFA Inhale 2 puffs into the lungs every 6 (six) hours as needed for wheezing or shortness of breath.   amiodarone  200 MG tablet Commonly known as: PACERONE  Take 1 tablet (200 mg total) by mouth daily.   Eliquis  5 MG Tabs tablet Generic drug: apixaban  Take 5 mg by mouth 2 (two) times daily.   midodrine  10 MG tablet Commonly known as: PROAMATINE  Take 1 tablet (10 mg total) by mouth 3 (three) times daily with meals. What changed:  when to take this additional instructions   polyethylene glycol 17 g packet Commonly known as: MiraLax  Take 17 g by mouth daily as  needed for moderate constipation or mild constipation. Also available over-the-counter   senna-docusate 8.6-50 MG tablet Commonly known as: Senokot-S Take 2 tablets by mouth at bedtime. For constipation   traZODone  50 MG tablet Commonly known as: DESYREL  Take 50 mg by mouth at bedtime as needed for sleep.   Velphoro  500 MG chewable tablet Generic drug: sucroferric oxyhydroxide Chew 1,000 mg by mouth 2 (two) times daily after a meal.   Visine 0.05 % ophthalmic solution Generic drug: tetrahydrozoline Place 2-3 drops into both eyes 2 (two) times daily as needed (dry eye).        Follow-up Information     [facility].   Specialty: Home Health Services Why: Someone will call you to schsedule first appointment Contact information: [address] [place] [address] [phone]         Theotis Haze ORN, NP. Schedule an appointment as soon as possible for a visit in 1 week(s).   Specialty: Nurse Practitioner Contact information: [address] [place] [address] [phone]                Allergies  Allergen Reactions   Codeine Shortness Of Breath   Dextromethorphan-Guaifenesin  Shortness Of Breath and Other (See Comments)   Iron Dextran Shortness Of Breath and Other (See Comments)   Morphine  And Codeine Shortness Of Breath    Other Reaction(s): Other (See Comments)    Consultations: Nephrology   Procedures/Studies: DG CHEST PORT 1 VIEW Result Date: 01/16/2023 CLINICAL DATA:  Pneumonia. Dialysis patient with difficulty breathing. EXAM: PORTABLE CHEST 1 VIEW COMPARISON:  Chest CT dated 01/15/2023. FINDINGS: Dialysis catheter in similar position. Slight interval improvement in bilateral interstitial and airspace opacities compared to prior radiograph. No pneumothorax. Stable mild cardiomegaly. Atherosclerotic calcification of the aorta. No acute osseous pathology. IMPRESSION: Slight interval  improvement in bilateral interstitial and airspace opacities. Electronically Signed   By: Vanetta Chou M.D.   On: 01/16/2023 09:34   DG Chest Port 1 View Result Date: 01/15/2023 CLINICAL DATA:  Dialysis patient with progressively worsening difficulty breathing. EXAM: PORTABLE CHEST 1 VIEW COMPARISON:  Portable chest and CTA chest both 12/27/2022 FINDINGS: Left IJ dialysis catheter again noted with the tip in the upper right atrium. Stable cardiomegaly. Mild central vascular congestion. Small pleural effusions. Similar findings were noted previously but today there is increased interstitial and patchy airspace disease in the left mid and both lower lung fields which could be due to pneumonia or airspace edema. There is chronic right lateral basal pleuroparenchymal disease and scarring. Tortuous, calcified aorta with stable mediastinum. There is osteopenia and mild thoracic spondylosis. No new osseous findings. IMPRESSION: 1. Increased interstitial and patchy airspace disease in the left mid and both lower lung fields which could be due to pneumonia or airspace edema. 2. Stable cardiomegaly with mild central vascular congestion and small pleural effusions. 3. Aortic atherosclerosis. Electronically Signed   By: Francis Quam M.D.   On: 01/15/2023 03:45   VAS US  LOWER EXTREMITY VENOUS (DVT) Result Date: 12/29/2022  Lower Venous DVT Study Patient Name:  KWASI JOUNG  Date of Exam:   12/28/2022 Medical Rec #: [id number]        Accession #:    [id number] Date of Birth: 21-Sep-1960        Patient Gender: M Patient Age:   33 years Exam Location:  [facility] Procedure:      VAS US  LOWER EXTREMITY VENOUS (DVT) Referring Phys: JUSTIN HOWERTER --------------------------------------------------------------------------------  Indications: Hx of pulmonary embolism.  Risk Factors: Hx of PE. Anticoagulation: Eliquis . Limitations: Patient clothing. Comparison Study: No significant changes seen since prior exam  09/05/21 Performing Technologist: Garnette Rockers  Examination Guidelines: A complete evaluation includes B-mode imaging, spectral Doppler, color Doppler, and power Doppler as needed of all accessible portions of each vessel. Bilateral testing is considered an integral part of a complete examination. Limited examinations for reoccurring indications may be performed as noted. The reflux portion of the exam is performed with the patient in reverse Trendelenburg.  +---------+---------------+---------+-----------+----------+-------------------+ RIGHT    CompressibilityPhasicitySpontaneityPropertiesThrombus Aging      +---------+---------------+---------+-----------+----------+-------------------+ CFV      Full           Yes      Yes                                      +---------+---------------+---------+-----------+----------+-------------------+ SFJ                                                   Not well visualized +---------+---------------+---------+-----------+----------+-------------------+ FV Prox  Full                                                             +---------+---------------+---------+-----------+----------+-------------------+  FV Mid   Full                                                             +---------+---------------+---------+-----------+----------+-------------------+ FV DistalFull                                                             +---------+---------------+---------+-----------+----------+-------------------+ PFV      Full                                                             +---------+---------------+---------+-----------+----------+-------------------+ POP      Full           Yes      Yes                                      +---------+---------------+---------+-----------+----------+-------------------+ PTV      Full                                                              +---------+---------------+---------+-----------+----------+-------------------+ PERO     Full                                                             +---------+---------------+---------+-----------+----------+-------------------+   +---------+---------------+---------+-----------+----------+--------------+ LEFT     CompressibilityPhasicitySpontaneityPropertiesThrombus Aging +---------+---------------+---------+-----------+----------+--------------+ CFV      Full           Yes      Yes                                 +---------+---------------+---------+-----------+----------+--------------+ SFJ      Full                                                        +---------+---------------+---------+-----------+----------+--------------+ FV Prox  Full                                                        +---------+---------------+---------+-----------+----------+--------------+ FV Mid   Full                                                        +---------+---------------+---------+-----------+----------+--------------+  FV DistalFull                                                        +---------+---------------+---------+-----------+----------+--------------+ PFV      Full                                                        +---------+---------------+---------+-----------+----------+--------------+ POP      Full           Yes      Yes                                 +---------+---------------+---------+-----------+----------+--------------+ PTV      Full                                                        +---------+---------------+---------+-----------+----------+--------------+ PERO     Full                                                        +---------+---------------+---------+-----------+----------+--------------+     Summary: BILATERAL: - No evidence of deep vein thrombosis seen in the lower extremities, bilaterally.  -No evidence of popliteal cyst, bilaterally.   *See table(s) above for measurements and observations. Electronically signed by Gaile New MD on 12/29/2022 at 10:03:02 AM.    Final    ECHOCARDIOGRAM COMPLETE Result Date: 12/28/2022    ECHOCARDIOGRAM REPORT   Patient Name:   RAMSES KLECKA Date of Exam: 12/28/2022 Medical Rec #:  [id number]       Height:       69.0 in Accession #:    [id number]      Weight:       170.0 lb Date of Birth:  1960-03-30       BSA:          1.928 m Patient Age:    62 years        BP:           154/83 mmHg Patient Gender: M               HR:           66 bpm. Exam Location:  Inpatient Procedure: 2D Echo, Cardiac Doppler, Color Doppler and Intracardiac            Opacification Agent Indications:    pulmonary embolism  History:        Patient has prior history of Echocardiogram examinations, most                 recent 09/05/2021. CHF, COPD and Pulmonary HTN, Arrythmias:Atrial                 Fibrillation; Signs/Symptoms:Shortness of Breath.  Sonographer:    Lanell Maduro Referring  Phys: [id number] JUSTIN B HOWERTER IMPRESSIONS  1. Left ventricular ejection fraction, by estimation, is 35 to 40%. The left ventricle has moderately decreased function. The left ventricle demonstrates global hypokinesis. The left ventricular internal cavity size was mildly dilated. There is mild left ventricular hypertrophy. Left ventricular diastolic parameters are indeterminate.  2. Right ventricular systolic function is moderately reduced. The right ventricular size is mildly enlarged. There is severely elevated pulmonary artery systolic pressure. The estimated right ventricular systolic pressure is 81.6 mmHg.  3. Left atrial size was severely dilated.  4. Right atrial size was mildly dilated.  5. The mitral valve is degenerative. Severe mitral valve regurgitation. Mild mitral stenosis. The mean mitral valve gradient is 4.5 mmHg with average heart rate of 63 bpm. Severe mitral annular calcification.  6.  The aortic valve was not well visualized. There is mild calcification of the aortic valve. Aortic valve regurgitation is not visualized. No aortic stenosis is present.  7. The inferior vena cava is dilated in size with <50% respiratory variability, suggesting right atrial pressure of 15 mmHg. FINDINGS  Left Ventricle: Left ventricular ejection fraction, by estimation, is 35 to 40%. The left ventricle has moderately decreased function. The left ventricle demonstrates global hypokinesis. The left ventricular internal cavity size was mildly dilated. There is mild left ventricular hypertrophy. Left ventricular diastolic parameters are indeterminate. Right Ventricle: The right ventricular size is mildly enlarged. No increase in right ventricular wall thickness. Right ventricular systolic function is moderately reduced. There is severely elevated pulmonary artery systolic pressure. The tricuspid regurgitant velocity is 4.08 m/s, and with an assumed right atrial pressure of 15 mmHg, the estimated right ventricular systolic pressure is 81.6 mmHg. Left Atrium: Left atrial size was severely dilated. Right Atrium: Right atrial size was mildly dilated. Pericardium: There is no evidence of pericardial effusion. Mitral Valve: The mitral valve is degenerative in appearance. There is severe thickening of the mitral valve leaflet(s). There is severe calcification of the mitral valve leaflet(s). Severe mitral annular calcification. Severe mitral valve regurgitation.  Mild mitral valve stenosis. The mean mitral valve gradient is 4.5 mmHg with average heart rate of 63 bpm. Tricuspid Valve: The tricuspid valve is normal in structure. Tricuspid valve regurgitation is mild . No evidence of tricuspid stenosis. Aortic Valve: The aortic valve was not well visualized. There is mild calcification of the aortic valve. Aortic valve regurgitation is not visualized. No aortic stenosis is present. Pulmonic Valve: The pulmonic valve was not well  visualized. Pulmonic valve regurgitation is trivial. No evidence of pulmonic stenosis. Aorta: The aortic root is normal in size and structure. Venous: A pattern of systolic flow reversal, suggestive of severe mitral regurgitation is recorded from the right lower pulmonary vein. The inferior vena cava is dilated in size with less than 50% respiratory variability, suggesting right atrial pressure  of 15 mmHg. IAS/Shunts: The atrial septum is grossly normal.  LEFT VENTRICLE PLAX 2D LVIDd:         5.90 cm   Diastology LVIDs:         3.40 cm   LV e' medial:    3.48 cm/s LV PW:         1.10 cm   LV E/e' medial:  42.2 LV IVS:        1.20 cm   LV e' lateral:   5.33 cm/s LVOT diam:     2.20 cm   LV E/e' lateral: 27.6 LV SV:         56 LV  SV Index:   29 LVOT Area:     3.80 cm  RIGHT VENTRICLE            IVC RV Basal diam:  3.90 cm    IVC diam: 2.20 cm RV S prime:     5.00 cm/s TAPSE (M-mode): 1.2 cm LEFT ATRIUM              Index        RIGHT ATRIUM           Index LA diam:        5.30 cm  2.75 cm/m   RA Area:     17.10 cm LA Vol (A2C):   95.0 ml  49.27 ml/m  RA Volume:   40.40 ml  20.95 ml/m LA Vol (A4C):   104.0 ml 53.94 ml/m LA Biplane Vol: 105.0 ml 54.46 ml/m  AORTIC VALVE LVOT Vmax:   87.70 cm/s LVOT Vmean:  53.200 cm/s LVOT VTI:    0.147 m  AORTA Ao Root diam: 2.80 cm Ao Asc diam:  3.50 cm MITRAL VALVE                  TRICUSPID VALVE MV Area (PHT): 3.79 cm       TR Peak grad:   66.6 mmHg MV Mean grad:  4.5 mmHg       TR Vmax:        408.00 cm/s MR Peak grad:    121.9 mmHg MR Mean grad:    71.0 mmHg    SHUNTS MR Vmax:         552.00 cm/s  Systemic VTI:  0.15 m MR Vmean:        384.0 cm/s   Systemic Diam: 2.20 cm MR PISA:         4.02 cm MR PISA Eff ROA: 26 mm MR PISA Radius:  0.80 cm MV E velocity: 147.00 cm/s MV A velocity: 99.80 cm/s MV E/A ratio:  1.47 Soyla Merck MD Electronically signed by Soyla Merck MD Signature Date/Time: 12/28/2022/4:30:23 PM    Final    CT Angio Chest PE W and/or Wo  Contrast Result Date: 12/27/2022 CLINICAL DATA:  Shortness of breath, worsening after antibiotics, cough EXAM: CT ANGIOGRAPHY CHEST WITH CONTRAST TECHNIQUE: Multidetector CT imaging of the chest was performed using the standard protocol during bolus administration of intravenous contrast. Multiplanar CT image reconstructions and MIPs were obtained to evaluate the vascular anatomy. RADIATION DOSE REDUCTION: This exam was performed according to the departmental dose-optimization program which includes automated exposure control, adjustment of the mA and/or kV according to patient size and/or use of iterative reconstruction technique. CONTRAST:  75mL OMNIPAQUE  IOHEXOL  350 MG/ML SOLN COMPARISON:  Same day chest radiograph and CTA chest 09/04/2021 FINDINGS: Cardiovascular: Filling defect in a posterior right lower lobe segmental artery (series 6/image 153). This is favored to represent a acute pulmonary embolism there is extensive mixing artifact in the right lower lobe pulmonary arteries. Mild cardiomegaly. Coronary artery and aortic atherosclerotic calcification. No evidence of right heart strain. No pericardial effusion. Reflux of contrast into the IVC and hepatic veins compatible with elevated right heart pressures. Dilated main pulmonary artery measuring 34 mm. Mediastinum/Nodes: Trachea and esophagus are unremarkable. Similar mediastinal adenopathy. For example an unchanged 1.2 cm right pretracheal node (6/87). Lungs/Pleura: Interlobular septal thickening and patchy ground-glass opacities with more confluence airspace opacities in the lower lobes. This is similar to 09/04/2021. Right fibrothorax and trace effusion. Small left effusion in the apex. Upper  Abdomen: Nodular hepatic contour compatible with cirrhosis. Cholelithiasis. Polycystic kidneys. Musculoskeletal: No acute fracture. Review of the MIP images confirms the above findings. IMPRESSION: 1. Acute nonocclusive pulmonary embolism in a posterior right  lower lobe segmental artery. Alternatively this could represent mixing artifact. No evidence of right heart strain. 2. Chronic lung disease similar to 09/04/2021. 3. Marked cardiomegaly and elevated right heart pressures. 4. Dilated main pulmonary artery can be seen with pulmonary arterial hypertension 5. Cirrhosis. Aortic Atherosclerosis (ICD10-I70.0). Electronically Signed   By: Norman Gatlin M.D.   On: 12/27/2022 22:41   DG Chest 2 View Result Date: 12/27/2022 CLINICAL DATA:  Shortness of breath, worsening after antibiotics. Cough EXAM: CHEST - 2 VIEW COMPARISON:  11/29/2022 FINDINGS: Stable cardiomegaly.  Aortic atherosclerotic calcification. Left IJ CVC tip in the right atrium. Bilateral airspace and interstitial opacities slightly improved from 11/29/2022. Small bilateral pleural effusions. No pneumothorax. IMPRESSION: Bilateral airspace and interstitial opacities may be due to edema or infection. Small pleural effusions. Electronically Signed   By: Norman Gatlin M.D.   On: 12/27/2022 19:39      Subjective: Patient seen and examined the bedside today.  He was on dialysis.  Appears overall comfortable.  Appears deconditioned but he is at his baseline.  On baseline requirement of oxygen.  Denies any worsening shortness of breath or cough.  He is eager to go home today.  Discharge Exam: Vitals:   01/19/23 0930 01/19/23 1000  BP: 115/73 116/79  Pulse: 75 87  Resp: 14 14  Temp:    SpO2: 100% 100%   Vitals:   01/19/23 0903 01/19/23 0925 01/19/23 0930 01/19/23 1000  BP: 131/73  115/73 116/79  Pulse: 81  75 87  Resp: 14  14 14   Temp:      TempSrc:      SpO2: 98%  100% 100%  Weight:  70 kg    Height:        General: Pt is alert, awake, not in acute distress, appears deconditioned Cardiovascular: RRR, S1/S2 +, no rubs, no gallops, dialysis catheter on the right chest Respiratory: CTA bilaterally, no wheezing, no rhonchi Abdominal: Soft, NT, ND, bowel sounds + Extremities: no  edema, no cyanosis    The results of significant diagnostics from this hospitalization (including imaging, microbiology, ancillary and laboratory) are listed below for reference.     Microbiology: Recent Results (from the past 240 hours)  Resp panel by RT-PCR (RSV, Flu A&B, Covid) Anterior Nasal Swab     Status: None   Collection Time: 01/15/23  3:25 AM   Specimen: Anterior Nasal Swab  Result Value Ref Range Status   SARS Coronavirus 2 by RT PCR NEGATIVE NEGATIVE Final   Influenza A by PCR NEGATIVE NEGATIVE Final   Influenza B by PCR NEGATIVE NEGATIVE Final    Comment: (NOTE) The Xpert Xpress SARS-CoV-2/FLU/RSV plus assay is intended as an aid in the diagnosis of influenza from Nasopharyngeal swab specimens and should not be used as a sole basis for treatment. Nasal washings and aspirates are unacceptable for Xpert Xpress SARS-CoV-2/FLU/RSV testing.  Fact Sheet for Patients: bloggercourse.com  Fact Sheet for Healthcare Providers: seriousbroker.it  This test is not yet approved or cleared by the [place]  FDA and has been authorized for detection and/or diagnosis of SARS-CoV-2 by FDA under an Emergency Use Authorization (EUA). This EUA will remain in effect (meaning this test can be used) for the duration of the COVID-19 declaration under Section 564(b)(1) of the Act, 21 U.S.C. section 360bbb-3(b)(1), unless  the authorization is terminated or revoked.     Resp Syncytial Virus by PCR NEGATIVE NEGATIVE Final    Comment: (NOTE) Fact Sheet for Patients: bloggercourse.com  Fact Sheet for Healthcare Providers: seriousbroker.it  This test is not yet approved or cleared by the [place]  FDA and has been authorized for detection and/or diagnosis of SARS-CoV-2 by FDA under an Emergency Use Authorization (EUA). This EUA will remain in effect (meaning this test can be used)  for the duration of the COVID-19 declaration under Section 564(b)(1) of the Act, 21 U.S.C. section 360bbb-3(b)(1), unless the authorization is terminated or revoked.  Performed at [facility], [address] [place], [place] [address]   Respiratory (~20 pathogens) panel by PCR     Status: None   Collection Time: 01/15/23  3:25 AM   Specimen: Nasopharyngeal Swab; Respiratory  Result Value Ref Range Status   Adenovirus NOT DETECTED NOT DETECTED Final   Coronavirus 229E NOT DETECTED NOT DETECTED Final    Comment: (NOTE) The Coronavirus on the Respiratory Panel, DOES NOT test for the novel  Coronavirus (2019 nCoV)    Coronavirus HKU1 NOT DETECTED NOT DETECTED Final   Coronavirus NL63 NOT DETECTED NOT DETECTED Final   Coronavirus OC43 NOT DETECTED NOT DETECTED Final   Metapneumovirus NOT DETECTED NOT DETECTED Final   Rhinovirus / Enterovirus NOT DETECTED NOT DETECTED Final   Influenza A NOT DETECTED NOT DETECTED Final   Influenza B NOT DETECTED NOT DETECTED Final   Parainfluenza Virus 1 NOT DETECTED NOT DETECTED Final   Parainfluenza Virus 2 NOT DETECTED NOT DETECTED Final   Parainfluenza Virus 3 NOT DETECTED NOT DETECTED Final   Parainfluenza Virus 4 NOT DETECTED NOT DETECTED Final   Respiratory Syncytial Virus NOT DETECTED NOT DETECTED Final   Bordetella pertussis NOT DETECTED NOT DETECTED Final   Bordetella Parapertussis NOT DETECTED NOT DETECTED Final   Chlamydophila pneumoniae NOT DETECTED NOT DETECTED Final   Mycoplasma pneumoniae NOT DETECTED NOT DETECTED Final    Comment: Performed at [facility], [address] [place], [place] [address]  Culture, blood (routine x 2) Call MD if unable to obtain prior to antibiotics being given     Status: None (Preliminary result)   Collection Time: 01/15/23  5:16 AM   Specimen: BLOOD RIGHT ARM  Result Value Ref Range Status   Specimen Description BLOOD RIGHT ARM  Final   Special Requests   Final    BOTTLES DRAWN AEROBIC  AND ANAEROBIC Blood Culture results may not be optimal due to an inadequate volume of blood received in culture bottles   Culture   Final    NO GROWTH 4 DAYS Performed at [facility], [address] [place], [place] [address]    Report Status PENDING  Incomplete  Culture, blood (routine x 2) Call MD if unable to obtain prior to antibiotics being given     Status: None (Preliminary result)   Collection Time: 01/15/23  5:24 AM   Specimen: BLOOD RIGHT ARM  Result Value Ref Range Status   Specimen Description BLOOD RIGHT ARM  Final   Special Requests   Final    BOTTLES DRAWN AEROBIC AND ANAEROBIC Blood Culture adequate volume   Culture   Final    NO GROWTH 4 DAYS Performed at [facility], [address], [place], [place] [address]    Report Status PENDING  Incomplete  MRSA Next Gen by PCR, Nasal     Status: None  Collection Time: 01/15/23 10:32 AM   Specimen: Nasal Mucosa; Nasal Swab  Result Value Ref Range Status   MRSA by PCR Next Gen NOT DETECTED NOT DETECTED Final    Comment: (NOTE) The GeneXpert MRSA Assay (FDA approved for NASAL specimens only), is one component of a comprehensive MRSA colonization surveillance program. It is not intended to diagnose MRSA infection nor to guide or monitor treatment for MRSA infections. Test performance is not FDA approved in patients less than 11 years old. Performed at [facility], [address] [place], [place] [address]   Culture, blood (Routine X 2) w Reflex to ID Panel     Status: None (Preliminary result)   Collection Time: 01/15/23 11:00 AM   Specimen: BLOOD RIGHT ARM  Result Value Ref Range Status   Specimen Description BLOOD RIGHT ARM  Final   Special Requests   Final    BOTTLES DRAWN AEROBIC AND ANAEROBIC Blood Culture results may not be optimal due to an inadequate volume of blood received in culture bottles   Culture   Final    NO GROWTH 4 DAYS Performed at [facility], [address]  [place], [place] [address]    Report Status PENDING  Incomplete  Culture, blood (Routine X 2) w Reflex to ID Panel     Status: None (Preliminary result)   Collection Time: 01/15/23 11:14 AM   Specimen: BLOOD RIGHT ARM  Result Value Ref Range Status   Specimen Description BLOOD RIGHT ARM  Final   Special Requests   Final    BOTTLES DRAWN AEROBIC AND ANAEROBIC Blood Culture results may not be optimal due to an inadequate volume of blood received in culture bottles   Culture   Final    NO GROWTH 4 DAYS Performed at [facility], [address], [place], [place] [address]    Report Status PENDING  Incomplete     Labs: BNP (last 3 results) Recent Labs    06/12/22 1527 12/27/22 1443 01/15/23 0325  BNP 1,479.2* 2,332.5* 3,540.1*   Basic Metabolic Panel: Recent Labs  Lab 01/15/23 0516 01/15/23 0524 01/15/23 0534 01/15/23 0923 01/16/23 1200 01/17/23 0417 01/18/23 0657 01/19/23 0515  NA  --  138   < > 134* 134* 135 135 135  K  --  6.1*   < > 6.1* 5.1 5.8* 4.9 5.3*  CL  --  96*  --   --  93* 95* 94* 94*  CO2  --  23  --   --  23 21* 24 21*  GLUCOSE  --  131*  --   --  110* 75 111* 99  BUN  --  79*  --   --  50* 63* 58* 103*  CREATININE  --  10.09*  --   --  7.13* 8.56* 7.13* 9.53*  CALCIUM   --  8.7*  --   --  8.6* 8.2* 8.8* 8.9  PHOS 11.2*  --   --   --   --  >30.0* 9.0* 11.2*   < > = values in this interval not displayed.   Liver Function Tests: Recent Labs  Lab 01/15/23 0325 01/15/23 0524 01/17/23 0417 01/18/23 0657 01/19/23 0515  AST 27 26  --   --   --   ALT 35 36  --   --   --   ALKPHOS 107 112  --   --   --   BILITOT 0.7 0.8  --   --   --  PROT 7.5 7.8  --   --   --   ALBUMIN  2.3* 2.3* 2.3* 2.4* 2.4*   No results for input(s): LIPASE, AMYLASE in the last 168 hours. No results for input(s): AMMONIA in the last 168 hours. CBC: Recent Labs  Lab 01/15/23 0325 01/15/23 0332 01/15/23 0524 01/15/23 0534 01/15/23 0923 01/16/23 1200 01/17/23 0415  01/18/23 0657 01/19/23 0515  WBC 8.6  --  7.9  --   --  7.0 8.0 6.9 8.4  NEUTROABS 5.8  --   --   --   --   --   --   --   --   HGB 12.1*   < > 12.3*   < > 12.2* 10.6* 10.5* 12.1* 12.2*  HCT 40.2   < > 41.1   < > 36.0* 34.9* 34.8* 39.7 39.7  MCV 85.2  --  84.9  --   --  84.5 83.3 83.6 82.2  PLT 85*  --  95*  --   --  87* 95* 98* 98*   < > = values in this interval not displayed.   Cardiac Enzymes: No results for input(s): CKTOTAL, CKMB, CKMBINDEX, TROPONINI in the last 168 hours. BNP: Invalid input(s): POCBNP CBG: Recent Labs  Lab 01/18/23 0732 01/18/23 1142 01/18/23 1619 01/18/23 2049 01/19/23 0727  GLUCAP 122* 141* 131* 149* 117*   D-Dimer No results for input(s): DDIMER in the last 72 hours. Hgb A1c No results for input(s): HGBA1C in the last 72 hours. Lipid Profile No results for input(s): CHOL, HDL, LDLCALC, TRIG, CHOLHDL, LDLDIRECT in the last 72 hours. Thyroid function studies No results for input(s): TSH, T4TOTAL, T3FREE, THYROIDAB in the last 72 hours.  Invalid input(s): FREET3 Anemia work up No results for input(s): VITAMINB12, FOLATE, FERRITIN, TIBC, IRON, RETICCTPCT in the last 72 hours. Urinalysis    Component Value Date/Time   COLORURINE YELLOW 02/13/2010 0703   APPEARANCEUR CLOUDY (A) 02/13/2010 0703   LABSPEC 1.013 02/13/2010 0703   PHURINE 8.5 (H) 02/13/2010 0703   GLUCOSEU NEGATIVE 12/18/2009 0546   HGBUR LARGE (A) 02/13/2010 0703   BILIRUBINUR NEGATIVE 02/13/2010 0703   KETONESUR NEGATIVE 02/13/2010 0703   PROTEINUR >300 (A) 02/13/2010 0703   UROBILINOGEN 0.2 02/13/2010 0703   NITRITE NEGATIVE 02/13/2010 0703   LEUKOCYTESUR NEGATIVE 02/13/2010 0703   Sepsis Labs Recent Labs  Lab 01/16/23 1200 01/17/23 0415 01/18/23 0657 01/19/23 0515  WBC 7.0 8.0 6.9 8.4   Microbiology Recent Results (from the past 240 hours)  Resp panel by RT-PCR (RSV, Flu A&B, Covid) Anterior Nasal Swab     Status:  None   Collection Time: 01/15/23  3:25 AM   Specimen: Anterior Nasal Swab  Result Value Ref Range Status   SARS Coronavirus 2 by RT PCR NEGATIVE NEGATIVE Final   Influenza A by PCR NEGATIVE NEGATIVE Final   Influenza B by PCR NEGATIVE NEGATIVE Final    Comment: (NOTE) The Xpert Xpress SARS-CoV-2/FLU/RSV plus assay is intended as an aid in the diagnosis of influenza from Nasopharyngeal swab specimens and should not be used as a sole basis for treatment. Nasal washings and aspirates are unacceptable for Xpert Xpress SARS-CoV-2/FLU/RSV testing.  Fact Sheet for Patients: bloggercourse.com  Fact Sheet for Healthcare Providers: seriousbroker.it  This test is not yet approved or cleared by the [place]  FDA and has been authorized for detection and/or diagnosis of SARS-CoV-2 by FDA under an Emergency Use Authorization (EUA). This EUA will remain in effect (meaning this test can be used) for  the duration of the COVID-19 declaration under Section 564(b)(1) of the Act, 21 U.S.C. section 360bbb-3(b)(1), unless the authorization is terminated or revoked.     Resp Syncytial Virus by PCR NEGATIVE NEGATIVE Final    Comment: (NOTE) Fact Sheet for Patients: bloggercourse.com  Fact Sheet for Healthcare Providers: seriousbroker.it  This test is not yet approved or cleared by the [place]  FDA and has been authorized for detection and/or diagnosis of SARS-CoV-2 by FDA under an Emergency Use Authorization (EUA). This EUA will remain in effect (meaning this test can be used) for the duration of the COVID-19 declaration under Section 564(b)(1) of the Act, 21 U.S.C. section 360bbb-3(b)(1), unless the authorization is terminated or revoked.  Performed at [facility], [address], [place], [place] [address]   Respiratory (~20 pathogens) panel by PCR     Status: None   Collection  Time: 01/15/23  3:25 AM   Specimen: Nasopharyngeal Swab; Respiratory  Result Value Ref Range Status   Adenovirus NOT DETECTED NOT DETECTED Final   Coronavirus 229E NOT DETECTED NOT DETECTED Final    Comment: (NOTE) The Coronavirus on the Respiratory Panel, DOES NOT test for the novel  Coronavirus (2019 nCoV)    Coronavirus HKU1 NOT DETECTED NOT DETECTED Final   Coronavirus NL63 NOT DETECTED NOT DETECTED Final   Coronavirus OC43 NOT DETECTED NOT DETECTED Final   Metapneumovirus NOT DETECTED NOT DETECTED Final   Rhinovirus / Enterovirus NOT DETECTED NOT DETECTED Final   Influenza A NOT DETECTED NOT DETECTED Final   Influenza B NOT DETECTED NOT DETECTED Final   Parainfluenza Virus 1 NOT DETECTED NOT DETECTED Final   Parainfluenza Virus 2 NOT DETECTED NOT DETECTED Final   Parainfluenza Virus 3 NOT DETECTED NOT DETECTED Final   Parainfluenza Virus 4 NOT DETECTED NOT DETECTED Final   Respiratory Syncytial Virus NOT DETECTED NOT DETECTED Final   Bordetella pertussis NOT DETECTED NOT DETECTED Final   Bordetella Parapertussis NOT DETECTED NOT DETECTED Final   Chlamydophila pneumoniae NOT DETECTED NOT DETECTED Final   Mycoplasma pneumoniae NOT DETECTED NOT DETECTED Final    Comment: Performed at [facility], [address], [place], [place] [address]  Culture, blood (routine x 2) Call MD if unable to obtain prior to antibiotics being given     Status: None (Preliminary result)   Collection Time: 01/15/23  5:16 AM   Specimen: BLOOD RIGHT ARM  Result Value Ref Range Status   Specimen Description BLOOD RIGHT ARM  Final   Special Requests   Final    BOTTLES DRAWN AEROBIC AND ANAEROBIC Blood Culture results may not be optimal due to an inadequate volume of blood received in culture bottles   Culture   Final    NO GROWTH 4 DAYS Performed at [facility], [address] [place], [place] [address]    Report Status PENDING  Incomplete  Culture, blood (routine x 2) Call MD if unable  to obtain prior to antibiotics being given     Status: None (Preliminary result)   Collection Time: 01/15/23  5:24 AM   Specimen: BLOOD RIGHT ARM  Result Value Ref Range Status   Specimen Description BLOOD RIGHT ARM  Final   Special Requests   Final    BOTTLES DRAWN AEROBIC AND ANAEROBIC Blood Culture adequate volume   Culture   Final    NO GROWTH 4 DAYS Performed at [facility], [address], [place], [place] [address]    Report Status  PENDING  Incomplete  MRSA Next Gen by PCR, Nasal     Status: None   Collection Time: 01/15/23 10:32 AM   Specimen: Nasal Mucosa; Nasal Swab  Result Value Ref Range Status   MRSA by PCR Next Gen NOT DETECTED NOT DETECTED Final    Comment: (NOTE) The GeneXpert MRSA Assay (FDA approved for NASAL specimens only), is one component of a comprehensive MRSA colonization surveillance program. It is not intended to diagnose MRSA infection nor to guide or monitor treatment for MRSA infections. Test performance is not FDA approved in patients less than 50 years old. Performed at [facility], [address], [place], [place] [address]   Culture, blood (Routine X 2) w Reflex to ID Panel     Status: None (Preliminary result)   Collection Time: 01/15/23 11:00 AM   Specimen: BLOOD RIGHT ARM  Result Value Ref Range Status   Specimen Description BLOOD RIGHT ARM  Final   Special Requests   Final    BOTTLES DRAWN AEROBIC AND ANAEROBIC Blood Culture results may not be optimal due to an inadequate volume of blood received in culture bottles   Culture   Final    NO GROWTH 4 DAYS Performed at [facility], [address] [place], [place] [address]    Report Status PENDING  Incomplete  Culture, blood (Routine X 2) w Reflex to ID Panel     Status: None (Preliminary result)   Collection Time: 01/15/23 11:14 AM   Specimen: BLOOD RIGHT ARM  Result Value Ref Range Status   Specimen Description BLOOD RIGHT ARM  Final   Special Requests   Final     BOTTLES DRAWN AEROBIC AND ANAEROBIC Blood Culture results may not be optimal due to an inadequate volume of blood received in culture bottles   Culture   Final    NO GROWTH 4 DAYS Performed at [facility], [address], [place], [place] [address]    Report Status PENDING  Incomplete    Please note: You were cared for by a hospitalist during your hospital stay. Once you are discharged, your primary care physician will handle any further medical issues. Please note that NO REFILLS for any discharge medications will be authorized once you are discharged, as it is imperative that you return to your primary care physician (or establish a relationship with a primary care physician if you do not have one) for your post hospital discharge needs so that they can reassess your need for medications and monitor your lab values.    Time coordinating discharge: 40 minutes  SIGNED:   Ivonne Mustache, MD  [facility] 01/19/2023, 10:18 AM Pager [phone]  If 7PM-7AM, please contact night-coverage [email or web address] Password [email or web address]

## 2023-01-19 NOTE — Progress Notes (Signed)
   01/19/23 1233  Vitals  Temp 97.8 F (36.6 C)  Pulse Rate 81  Resp 16  BP 135/85  SpO2 100 %  Oxygen Therapy  O2 Flow Rate (L/min) 3 L/min  Patient Activity (if Appropriate) In bed  Pulse Oximetry Type Continuous  Post Treatment  Dialyzer Clearance Lightly streaked  Liters Processed 540  Fluid Removed (mL) 2300 mL   Received patient in bed to unit.  Alert and oriented.  Informed consent signed and in chart.   TX duration:3.0 Per MD run tx today for 3 hours.  Patient unable to reach UFG due to low BP and c/o feeling dizzy.  Transported back to the room  Alert, without acute distress.  Hand-off given to patient's nurse.   Access used: L CVC   Total UF removed: Medication(s) given: See MAR    Geni Seip, RN [facility]

## 2023-01-20 LAB — CULTURE, BLOOD (ROUTINE X 2)
Culture: NO GROWTH
Culture: NO GROWTH
Culture: NO GROWTH
Culture: NO GROWTH
Special Requests: ADEQUATE

## 2023-01-21 ENCOUNTER — Telehealth: Payer: Self-pay

## 2023-01-21 NOTE — Progress Notes (Signed)
 Late Note Entry- Jan. 13, 2025  Pt was d/c on Saturday. Contacted [facility] this morning to be advised of pt's d/c date and that pt should resume care tomorrow.   Olivia Canter Renal Navigator [phone]

## 2023-01-21 NOTE — Transitions of Care (Post Inpatient/ED Visit) (Signed)
   01/21/2023  Name: [name] MRN: [id number] DOB: October 24, 1960  Today's TOC FU Call Status: Today's TOC FU Call Status:: Unsuccessful Call (1st Attempt) Unsuccessful Call (1st Attempt) Date: 01/21/23  Attempted to reach the patient regarding the most recent Inpatient/ED visit.  Follow Up Plan: Additional outreach attempts will be made to reach the patient to complete the Transitions of Care (Post Inpatient/ED visit) call.   Channing Larry, RN, BA, [facility], CRRN [facility] Coordinator, Transition of Care Ph # [phone]

## 2023-01-22 ENCOUNTER — Telehealth: Payer: Self-pay

## 2023-01-22 NOTE — Transitions of Care (Post Inpatient/ED Visit) (Signed)
   01/22/2023  Name: RAYAN INES MRN: [id number] DOB: [birth year]  Today's TOC FU Call Status: Today's TOC FU Call Status:: Unsuccessful Call (2nd Attempt) Unsuccessful Call (2nd Attempt) Date: 01/22/23  Attempted to reach the patient regarding the most recent Inpatient/ED visit.  Follow Up Plan: Additional outreach attempts will be made to reach the patient to complete the Transitions of Care (Post Inpatient/ED visit) call.   Channing Larry, RN, BA, [facility], CRRN [facility] Coordinator, Transition of Care Ph # [phone]

## 2023-01-23 ENCOUNTER — Telehealth: Payer: Self-pay

## 2023-01-23 NOTE — Transitions of Care (Post Inpatient/ED Visit) (Signed)
   01/23/2023  Name: Nathaniel White MRN: [id number] DOB: [birth year]  Today's TOC FU Call Status: Today's TOC FU Call Status:: Unsuccessful Call (3rd Attempt) Unsuccessful Call (3rd Attempt) Date: 01/23/23  Attempted to reach the patient regarding the most recent Inpatient/ED visit.  Follow Up Plan: No further outreach attempts will be made at this time. We have been unable to contact the patient.  Santina Cull, RN, BA, [facility], CRRN [facility] Coordinator, Transition of Care Ph # [phone]

## 2023-01-24 ENCOUNTER — Telehealth: Payer: Self-pay

## 2023-01-24 NOTE — Telephone Encounter (Signed)
Copied from CRM [id number]. Topic: General - Other >> Jan 23, 2023  1:46 PM Shon Hale wrote: Reason for CRM: Liji, physical therapist with [facility] calling to advise provider pt had PT today but requested pt requested start of care to moved to 01-25-2023.

## 2023-01-28 ENCOUNTER — Telehealth: Payer: Self-pay

## 2023-01-28 NOTE — Telephone Encounter (Signed)
Copied from CRM [id number]. Topic: Quick Communication - Home Health Verbal Orders >> Jan 28, 2023  8:56 AM Clide Dales wrote: Caller/Agency: Liji/[facility] Callback Number: [phone] Service Requested: Physical Therapy Frequency: 1w1,2w2,1w4 Any new concerns about the patient? No  Ligi is also requesting an order be place for a sliding board.

## 2023-01-29 ENCOUNTER — Ambulatory Visit: Payer: Self-pay

## 2023-01-29 ENCOUNTER — Telehealth: Payer: Self-pay | Admitting: Nurse Practitioner

## 2023-01-29 NOTE — Telephone Encounter (Signed)
Chief Complaint: Verbal orders needed   Disposition: [] ED /[] Urgent Care (no appt availability in office) / [] Appointment(In office/virtual)/ []  [facility]/ [] Home Care/ [] Refused Recommended Disposition /[] [facility]/ [x]  Follow-up with PCP Additional Notes: Liji from [facility] is calling to get verbal orders for Oxygen, PT and slide board. Liji stated the patient has an O2 tank in the home set at 4L but she is unsure if it suppose to be continuous or intermittent.  Liji also stated she called yesterday to request verbal orders for PT and a slide board but has not received the approval yet. Care advice was given and I was unable to find documentation for oxygen orders. Advised I would forward message to PCP for verbal orders an approvals that are needed.    Summary: medication clarification   Liji from [facility] called to clarification on the patients oxygen dosage and if its intermittent or continuance. Please f/u with Liji at [phone]     Reason for Disposition  [1] Caller has NON-URGENT medicine question about med that PCP prescribed AND [2] triager unable to answer question  Answer Assessment - Initial Assessment Questions 1. NAME of MEDICINE: "What medicine(s) are you calling about?"     Oxygen  2. QUESTION: "What is your question?" (e.g., double dose of medicine, side effect)     Is the patient suppose to on intermittent O2 or Continuous?   3. PRESCRIBER: "Who prescribed the medicine?" Reason: if prescribed by specialist, call should be referred to that group.     Unsure  Protocols used: Medication Question Call-A-AH

## 2023-01-29 NOTE — Telephone Encounter (Signed)
I wanted to inform you that I received a call from [facility] and spoke with the physical Therapist Lebron Conners regarding a patient [name]. They have requested a call back. You can reach them at [phone]   Patient Name: Nathaniel White  DOB: [birth year]  MRN: [id number]

## 2023-01-30 NOTE — Telephone Encounter (Signed)
Verbal orders given.  Patient is requesting a side board to help when transferring in sitting positions and to gain some of his independence back.

## 2023-01-31 ENCOUNTER — Other Ambulatory Visit: Payer: Self-pay | Admitting: Nurse Practitioner

## 2023-01-31 DIAGNOSIS — I5020 Unspecified systolic (congestive) heart failure: Secondary | ICD-10-CM

## 2023-01-31 DIAGNOSIS — J9621 Acute and chronic respiratory failure with hypoxia: Secondary | ICD-10-CM

## 2023-01-31 DIAGNOSIS — R531 Weakness: Secondary | ICD-10-CM

## 2023-01-31 MED ORDER — MISC. DEVICES MISC: 0 refills | Status: DC

## 2023-01-31 NOTE — Telephone Encounter (Signed)
Order in chart

## 2023-02-04 ENCOUNTER — Encounter (HOSPITAL_COMMUNITY): Payer: Self-pay

## 2023-02-04 ENCOUNTER — Inpatient Hospital Stay (HOSPITAL_COMMUNITY)
Admission: EM | Admit: 2023-02-04 | Discharge: 2023-02-06 | DRG: 640 | Disposition: A | Discharge status: Home Health | Payer: Medicare Other | Attending: Internal Medicine | Admitting: Internal Medicine

## 2023-02-04 ENCOUNTER — Emergency Department (HOSPITAL_COMMUNITY): Payer: Medicare Other

## 2023-02-04 ENCOUNTER — Other Ambulatory Visit: Payer: Self-pay

## 2023-02-04 DIAGNOSIS — I340 Nonrheumatic mitral (valve) insufficiency: Secondary | ICD-10-CM | POA: Diagnosis present

## 2023-02-04 DIAGNOSIS — D631 Anemia in chronic kidney disease: Secondary | ICD-10-CM | POA: Diagnosis present

## 2023-02-04 DIAGNOSIS — Z833 Family history of diabetes mellitus: Secondary | ICD-10-CM

## 2023-02-04 DIAGNOSIS — I9589 Other hypotension: Secondary | ICD-10-CM | POA: Diagnosis present

## 2023-02-04 DIAGNOSIS — E8770 Fluid overload, unspecified: Secondary | ICD-10-CM | POA: Diagnosis not present

## 2023-02-04 DIAGNOSIS — I428 Other cardiomyopathies: Secondary | ICD-10-CM | POA: Diagnosis present

## 2023-02-04 DIAGNOSIS — E875 Hyperkalemia: Secondary | ICD-10-CM | POA: Diagnosis not present

## 2023-02-04 DIAGNOSIS — I132 Hypertensive heart and chronic kidney disease with heart failure and with stage 5 chronic kidney disease, or end stage renal disease: Secondary | ICD-10-CM | POA: Diagnosis present

## 2023-02-04 DIAGNOSIS — E1122 Type 2 diabetes mellitus with diabetic chronic kidney disease: Secondary | ICD-10-CM | POA: Diagnosis present

## 2023-02-04 DIAGNOSIS — J4489 Other specified chronic obstructive pulmonary disease: Secondary | ICD-10-CM | POA: Diagnosis present

## 2023-02-04 DIAGNOSIS — Z992 Dependence on renal dialysis: Secondary | ICD-10-CM

## 2023-02-04 DIAGNOSIS — Z87891 Personal history of nicotine dependence: Secondary | ICD-10-CM

## 2023-02-04 DIAGNOSIS — Z993 Dependence on wheelchair: Secondary | ICD-10-CM

## 2023-02-04 DIAGNOSIS — D696 Thrombocytopenia, unspecified: Secondary | ICD-10-CM | POA: Diagnosis present

## 2023-02-04 DIAGNOSIS — R0602 Shortness of breath: Principal | ICD-10-CM

## 2023-02-04 DIAGNOSIS — J810 Acute pulmonary edema: Secondary | ICD-10-CM

## 2023-02-04 DIAGNOSIS — R0902 Hypoxemia: Secondary | ICD-10-CM

## 2023-02-04 DIAGNOSIS — N186 End stage renal disease: Secondary | ICD-10-CM | POA: Diagnosis present

## 2023-02-04 DIAGNOSIS — I5022 Chronic systolic (congestive) heart failure: Secondary | ICD-10-CM | POA: Diagnosis present

## 2023-02-04 DIAGNOSIS — E1151 Type 2 diabetes mellitus with diabetic peripheral angiopathy without gangrene: Secondary | ICD-10-CM | POA: Diagnosis present

## 2023-02-04 DIAGNOSIS — I480 Paroxysmal atrial fibrillation: Secondary | ICD-10-CM | POA: Diagnosis present

## 2023-02-04 DIAGNOSIS — Z79899 Other long term (current) drug therapy: Secondary | ICD-10-CM

## 2023-02-04 DIAGNOSIS — J9621 Acute and chronic respiratory failure with hypoxia: Secondary | ICD-10-CM | POA: Diagnosis present

## 2023-02-04 DIAGNOSIS — N250 Renal osteodystrophy: Secondary | ICD-10-CM | POA: Diagnosis present

## 2023-02-04 DIAGNOSIS — I2489 Other forms of acute ischemic heart disease: Secondary | ICD-10-CM | POA: Diagnosis present

## 2023-02-04 DIAGNOSIS — Z89022 Acquired absence of left finger(s): Secondary | ICD-10-CM

## 2023-02-04 DIAGNOSIS — Z885 Allergy status to narcotic agent status: Secondary | ICD-10-CM

## 2023-02-04 DIAGNOSIS — N2581 Secondary hyperparathyroidism of renal origin: Secondary | ICD-10-CM | POA: Diagnosis present

## 2023-02-04 DIAGNOSIS — Z8249 Family history of ischemic heart disease and other diseases of the circulatory system: Secondary | ICD-10-CM

## 2023-02-04 DIAGNOSIS — Z89422 Acquired absence of other left toe(s): Secondary | ICD-10-CM

## 2023-02-04 DIAGNOSIS — R0600 Dyspnea, unspecified: Secondary | ICD-10-CM | POA: Diagnosis present

## 2023-02-04 DIAGNOSIS — I69854 Hemiplegia and hemiparesis following other cerebrovascular disease affecting left non-dominant side: Secondary | ICD-10-CM

## 2023-02-04 DIAGNOSIS — L89610 Pressure ulcer of right heel, unstageable: Secondary | ICD-10-CM | POA: Diagnosis present

## 2023-02-04 DIAGNOSIS — Z888 Allergy status to other drugs, medicaments and biological substances status: Secondary | ICD-10-CM

## 2023-02-04 DIAGNOSIS — G4700 Insomnia, unspecified: Secondary | ICD-10-CM | POA: Diagnosis present

## 2023-02-04 DIAGNOSIS — Z9981 Dependence on supplemental oxygen: Secondary | ICD-10-CM

## 2023-02-04 DIAGNOSIS — Z7901 Long term (current) use of anticoagulants: Secondary | ICD-10-CM

## 2023-02-04 LAB — CBC
HCT: 43.6 % (ref 39.0–52.0)
Hemoglobin: 13 g/dL (ref 13.0–17.0)
MCH: 25 pg — ABNORMAL LOW (ref 26.0–34.0)
MCHC: 29.8 g/dL — ABNORMAL LOW (ref 30.0–36.0)
MCV: 83.8 fL (ref 80.0–100.0)
Platelets: 111 10*3/uL — ABNORMAL LOW (ref 150–400)
RBC: 5.2 MIL/uL (ref 4.22–5.81)
RDW: 18.5 % — ABNORMAL HIGH (ref 11.5–15.5)
WBC: 7.8 10*3/uL (ref 4.0–10.5)
nRBC: 0 % (ref 0.0–0.2)

## 2023-02-04 LAB — TROPONIN I (HIGH SENSITIVITY): Troponin I (High Sensitivity): 132 ng/L (ref <18)

## 2023-02-04 NOTE — ED Provider Triage Note (Signed)
Emergency Medicine Provider Triage Evaluation Note  Nathaniel White , a 63 y.o. male  was evaluated in triage.  Pt complains of Shortness of breath.  Review of Systems  Positive: Shortness of breath, congestion, fatigue, increased oxygen need Negative: Edema, chest pain, weakness  Physical Exam  BP 119/65 (BP Location: Right Arm)   Pulse (!) 45   Temp 97.9 F (36.6 C) (Oral)   Resp 18   Wt 74.8 kg   SpO2 (!) 88%   BMI 24.37 kg/m  Gen:   Awake, no distress   Resp:  Increased effort, on 4L O2 instead of usual 2.5L MSK:   Moves extremities without difficulty  Other:  No extremity edema  Medical Decision Making  Medically screening exam initiated at 10:04 PM.  Appropriate orders placed.  ELIAV MECHLING was informed that the remainder of the evaluation will be completed by another provider, this initial triage assessment does not replace that evaluation, and the importance of remaining in the ED until their evaluation is complete.  Labs and imaging ordered   Gretta Began 02/04/23 2206

## 2023-02-04 NOTE — ED Triage Notes (Signed)
Pt BIB EMS with c/o SOB that started today. Pt is a HD and has not missed any treatments. Pt goes to [facility]. Per pt, he had to increase his oxygen due to SOB today. Pt usually wears 2.5L at home. Pt denies CP.     107/65 65 CBG 88

## 2023-02-04 NOTE — ED Provider Notes (Signed)
[facility] Emergency Department Provider Note MRN:  [id number]  Arrival date & time: 02/05/23     Chief Complaint   Shortness of Breath   History of Present Illness   Nathaniel White is a 63 y.o. year-old male with a history of COPD, diabetes, ESRD, CHF presenting to the ED with chief complaint of shortness of breath.  Shortness of breath starting earlier today, not going away.  Woke him from sleep.  Denies chest pain.  Shortness of breath worse with exertion.  Denies recent cough or fever or cold-like symptoms, no abdominal pain, no numbness or weakness to the arms or legs, no medication changes.  Last dialysis on Saturday.  Review of Systems  A thorough review of systems was obtained and all systems are negative except as noted in the HPI and PMH.   Patient's Health History    Past Medical History:  Diagnosis Date   Anemia    Anxiety    Asthma    Complication from renal dialysis device 11/02/2013   Complication of anesthesia 2017   according to pt and spouse pt was moving around and cough while under and pt had difficulty waking up so the anesthesia had to be reversed. and pt admitted to ICU.    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/2008   Qualifier: Diagnosis of  By: Russella Dar MD Marylu Lund    ESRD    on HD, T-TH-Sat - [place]   Hemiparesis due to old stroke [facility])    left   HFrEF (heart failure with reduced ejection fraction) (HCC)    NICM // TTE 06/2022: EF 25-30, sever RV dysfunction, mod to severe MR   Hypertension    Hx, not current problems, no meds   LV dysfunction    EF 25-30% by echo 07/2011   Memory loss due to medical condition    due to stroke   MR (mitral regurgitation)    moderate to severe, echo 07/2011    Nausea with vomiting, unspecified 06/15/2021   Paroxysmal atrial fibrillation (HCC)    Peripheral vascular disease (HCC)    Shortness of breath    with exertion   Stroke (HCC)    TIA's-left sided weakness   Tobacco abuse     Past Surgical History:  Procedure Laterality Date   ABDOMINAL AORTOGRAM W/LOWER EXTREMITY Bilateral 07/21/2018   Procedure: ABDOMINAL AORTOGRAM W/LOWER EXTREMITY;  Surgeon: Maeola Harman, MD;  Location: [facility] INVASIVE CV LAB;  Service: Cardiovascular;  Laterality: Bilateral;   AMPUTATION Left 11/05/2018   Procedure: AMPUTATION SECOND TOE LEFT FOOT;  Surgeon: Maeola Harman, MD;  Location: [facility] OR;  Service: Vascular;  Laterality: Left;   AMPUTATION Left 02/13/2021   Procedure: Left small finger AMPUTATION DIGIT;  Surgeon: Marlyne Beards, MD;  [facility] OR;  Service: Orthopedics;  Laterality: Left;   AV FISTULA PLACEMENT     CARDIAC CATHETERIZATION     [facility]   COLONOSCOPY W/ BIOPSIES AND POLYPECTOMY     FISTULA SUPERFICIALIZATION Left 11/10/2013   Procedure: FISTULA PLICATION;  Surgeon: Nada Libman, MD;  Location[facility] OR;  Service: Vascular;  Laterality: Left;   INSERTION OF DIALYSIS CATHETER N/A 06/20/2021   Procedure: INSERTION OF DIALYSIS CATHETER;  Surgeon: Victorino Sparrow,  MD;  [facility] OR;  Service: Vascular;  Laterality: N/A;   KIDNEY TRANSPLANT     2011 rejected kidney 2012 back on dialysis   LEFT AND RIGHT HEART CATHETERIZATION WITH CORONARY ANGIOGRAM N/A 10/09/2013   Procedure: LEFT AND RIGHT HEART CATHETERIZATION WITH CORONARY ANGIOGRAM;  Surgeon: Kathleene Hazel, MD;  Location[facility] CATH LAB;  Service: Cardiovascular;  Laterality: N/A;   LIGATION OF ARTERIOVENOUS  FISTULA Left 06/20/2021   Procedure: LIGATION OF ARTERIOVENOUS  FISTULA;  Surgeon: Victorino Sparrow, MD;  Location: [facility] OR;  Service: Vascular;  Laterality: Left;   PERIPHERAL VASCULAR INTERVENTION Left 07/21/2018   Procedure: PERIPHERAL VASCULAR  INTERVENTION;  Surgeon: Maeola Harman, MD;  Location: [facility] INVASIVE CV LAB;  Service: Cardiovascular;  Laterality: Left;  Popliteal   REVISON OF ARTERIOVENOUS FISTULA Left 08/26/2013   Procedure: EXCISION OF ERODED SKIN AND EXPLORATION OF MAIN LEFT UPPER ARM AV FISTULA;  Surgeon: Larina Earthly, MD;  Location: [facility] OR;  Service: Vascular;  Laterality: Left;   REVISON OF ARTERIOVENOUS FISTULA Left 02/05/2014   Procedure: REPAIR OF ARTERIOVENOUS FISTULA ANEURYSM;  Surgeon: Nada Libman, MD;  [facility];  Service: Vascular;  Laterality: Left;   REVISON OF ARTERIOVENOUS FISTULA Left 03/10/2021   Procedure: LEFT ARM REVISION OF ARTERIOVENOUS FISTULA WITH PLICATION;  Surgeon: Maeola Harman, MD;  Location: [facility] OR;  Service: Vascular;  Laterality: Left;   RIGHT/LEFT HEART CATH AND CORONARY ANGIOGRAPHY N/A 06/21/2022   Procedure: RIGHT/LEFT HEART CATH AND CORONARY ANGIOGRAPHY;  Surgeon: Lennette Bihari, MD;  [facility] INVAS[facility] CV LAB;  Service: Cardiovascular;  Laterality: N/A;   SCROTAL EXPLORATION N/A 09/16/2022   Procedure: PENILE DEBRIDMENT;  Surgeon: Rene Paci, MD;  Location: [facility] OR;  Service: Urology;  Laterality: N/A;   SHUNTOGRAM N/A 11/05/2012   Procedure: Fistulogram;  Surgeon: Nada Libman, MD;  Location: [facility] CATH LAB;  Service: Cardiovascular;  Laterality: N/A;   TEE WITHOUT CARDIOVERSION  08/17/2011   Procedure: TRANSESOPHAGEAL ECHOCARDIOGRAM (TEE);  Surgeon: Laurey Morale, MD;  Location: [facility] ENDOSCOPY;  Service: Cardiovascular;  Laterality: N/A;   ULTRASOUND GUIDANCE FOR VASCULAR ACCESS  06/20/2021   Procedure: ULTRASOUND GUIDANCE FOR VASCULAR ACCESS;  Surgeon: Victorino Sparrow, MD;  Location: [facility] OR;  Service: Vascular;;   VIDEO ASSISTED THORACOSCOPY (VATS)/DECORTICATION  08/10/11    Family History  Problem Relation Age of Onset   Hypertension Mother    Varicose Veins Mother    Diabetes Paternal Grandmother    Cancer Paternal Grandfather    CAD Paternal  Uncle     Social History   Socioeconomic History   Marital status: Single    Spouse name: Not on file   Number of children: 1   Years of education: Not on file   Highest education level: Not on file  Occupational History   Occupation: Nurse, adult and Public librarian: DISABLED  Tobacco Use   Smoking status: Former    Current packs/day: 0.00    Average packs/day: 0.5 packs/day for 15.0 years (7.5 ttl pk-yrs)    Types: Cigarettes    Start date: 1999    Quit date: 2014    Years since quitting: 11.0   Smokeless tobacco: Never  Vaping Use   Vaping status: Never Used  Substance and Sexual Activity   Alcohol use: No    Alcohol/week: 0.0 standard drinks of alcohol   Drug use: No   Sexual activity: Not Currently  Other Topics Concern   Not on file  Social History Narrative   Not on file   Social Drivers of Health   Financial Resource Strain: Low Risk  (07/18/2022)   Overall Financial Resource Strain (CARDIA)    Difficulty of Paying Living Expenses: Not hard at all  Food Insecurity: No Food Insecurity (01/17/2023)   Hunger Vital Sign    Worried About Running Out of Food in the Last Year: Never true    Ran Out of Food in the Last Year: Never true  Transportation Needs: No Transportation Needs (01/16/2023)   PRAPARE - Administrator, Civil Service (Medical): No    Lack of Transportation (Non-Medical): No  Physical Activity: Inactive (07/18/2022)   Exercise Vital Sign    Days of Exercise per Week: 0 days    Minutes of Exercise per Session: 0 min  Stress: No Stress Concern Present (07/18/2022)   [employer] of Occupational Health - Occupational Stress Questionnaire    Feeling of Stress : Only a little  Social Connections: Moderately Isolated (07/18/2022)   Social Connection and Isolation Panel [NHANES]    Frequency of Communication with Friends and Family: More than three times a week    Frequency of Social Gatherings with Friends and Family: Three times a  week    Attends Religious Services: 1 to 4 times per year    Active Member of Clubs or Organizations: No    Attends Banker Meetings: Never    Marital Status: Never married  Intimate Partner Violence: Not At Risk (01/16/2023)   Humiliation, Afraid, Rape, and Kick questionnaire    Fear of Current or Ex-Partner: No    Emotionally Abused: No    Physically Abused: No    Sexually Abused: No     Physical Exam   Vitals:   02/05/23 0015 02/05/23 0030  BP: 135/83 125/79  Pulse:    Resp: 18 16  Temp:    SpO2:      CONSTITUTIONAL: Chronically ill-appearing, NAD NEURO/PSYCH:  Alert and oriented x 3, no focal deficits EYES:  eyes equal and reactive ENT/NECK:  no LAD, no JVD CARDIO: Regular rate, well-perfused, normal S1 and S2 PULM:  CTAB no wheezing or rhonchi GI/GU:  non-distended, non-tender MSK/SPINE:  No gross deformities, no edema SKIN:  no rash, atraumatic   *Additional and/or pertinent findings included in MDM below  Diagnostic and Interventional Summary    EKG Interpretation Date/Time:   02/04/2023 @ 22:05:51 Ventricular Rate:   48 PR Interval:   prolonged QRS Duration:   158 QT Interval:   556 QTC Calculation:  496 R Axis:      Text Interpretation:  First degree AV block, LBBB, SR       Labs Reviewed  CBC - Abnormal; Notable for the following components:      Result Value   MCH 25.0 (*)    MCHC 29.8 (*)    RDW 18.5 (*)    Platelets 111 (*)    All other components within normal limits  BRAIN NATRIURETIC PEPTIDE - Abnormal; Notable for the following components:   B Natriuretic Peptide 3,087.6 (*)    All other components within normal limits  BASIC METABOLIC PANEL - Abnormal; Notable for the following components:   Potassium 6.3 (*)    Chloride 95 (*)    CO2 19 (*)    BUN 67 (*)    Creatinine, Ser 10.97 (*)    Calcium 8.8 (*)    GFR, Estimated 5 (*)    Anion gap 22 (*)  All other components within normal limits  TROPONIN I (HIGH  SENSITIVITY) - Abnormal; Notable for the following components:   Troponin I (High Sensitivity) 132 (*)    All other components within normal limits  TROPONIN I (HIGH SENSITIVITY) - Abnormal; Notable for the following components:   Troponin I (High Sensitivity) 120 (*)    All other components within normal limits  RESP PANEL BY RT-PCR (RSV, FLU A&B, COVID)  [id number]    DG Chest 2 View  Final Result      Medications  calcium gluconate inj 10% (1 g) URGENT USE ONLY! (has no administration in time range)  insulin aspart (novoLOG) injection 5 Units (has no administration in time range)    And  dextrose 50 % solution 50 mL (has no administration in time range)  sodium bicarbonate injection 50 mEq (has no administration in time range)  sodium zirconium cyclosilicate (LOKELMA) packet 10 g (has no administration in time range)     Procedures  /  Critical Care .Critical Care  Performed by: Sabas Sous, MD Authorized by: Sabas Sous, MD   Critical care provider statement:    Critical care time (minutes):  35   Critical care was necessary to treat or prevent imminent or life-threatening deterioration of the following conditions:  Metabolic crisis (hyperkalemia)   Critical care was time spent personally by me on the following activities:  Development of treatment plan with patient or surrogate, discussions with consultants, evaluation of patient's response to treatment, examination of patient, ordering and review of laboratory studies, ordering and review of radiographic studies, ordering and performing treatments and interventions, pulse oximetry, re-evaluation of patient's condition and review of old charts   ED Course and Medical Decision Making  Initial Impression and Ddx Recent admission for acute on chronic hypoxic respiratory failure, suspected to be multifactorial at the time given patient's past medical history.  Was also found to have pneumonia during last admission on 1-7,  treated with Zosyn, also steroids for COPD.  Sounds like a similar presentation today.  Normally on 2-1/2 L nasal cannula at home, currently on 3 L to maintain his saturations.  He feels relatively comfortable at this time while laying still on the 3 L.  Vital signs notable for bradycardia which seems new for him.  On the monitor appears to be sinus bradycardia.  EKG similar to prior however.  Differential diagnosis includes pulmonary edema, no wheezing on exam to suggest COPD exacerbation, patient is chronically anticoagulated and so overall doubt PE.  No chest pain to suggest ACS.  Past medical/surgical history that increases complexity of ED encounter: ESRD, COPD, CHF  Interpretation of Diagnostics I personally reviewed the EKG and my interpretation is as follows: Sinus bradycardia, wide-complex similar to prior  Labs reveal K of 6.3, mild troponin elevation.  CXR with evidence of pulm edema.  Patient Reassessment and Ultimate Disposition/Management     Discussed case with Dr. Malen Gauze of nephrology who will put patient on schedule for HD, admitted to hospitalist.  Providing patient with hyperK treatments here in the ED.  Well appearing, NAD on reassessment.  Patient management required discussion with the following services or consulting groups:  Hospitalist Service and Nephrology  Complexity of Problems Addressed Acute illness or injury that poses threat of life of bodily function  Additional Data Reviewed and Analyzed Further history obtained from: Prior labs/imaging results  Additional Factors Impacting ED Encounter Risk Consideration of hospitalization  Elmer Sow. Pilar Plate, MD [facility] Emergency Medicine [facility]  [facility] [email or web address]  Final Clinical Impressions(s) / ED Diagnoses     ICD-10-CM   1. SOB (shortness of breath)  R06.02     2. Hypoxia  R09.02     3. Hyperkalemia  E87.5     4. Acute pulmonary edema (HCC)  J81.0       ED Discharge Orders      None        Discharge Instructions Discussed with and Provided to Patient:   Discharge Instructions   None      Sabas Sous, MD 02/05/23 [phone]

## 2023-02-05 DIAGNOSIS — I5022 Chronic systolic (congestive) heart failure: Secondary | ICD-10-CM | POA: Diagnosis present

## 2023-02-05 DIAGNOSIS — E1122 Type 2 diabetes mellitus with diabetic chronic kidney disease: Secondary | ICD-10-CM | POA: Diagnosis present

## 2023-02-05 DIAGNOSIS — E8770 Fluid overload, unspecified: Secondary | ICD-10-CM | POA: Diagnosis present

## 2023-02-05 DIAGNOSIS — J9621 Acute and chronic respiratory failure with hypoxia: Secondary | ICD-10-CM | POA: Diagnosis present

## 2023-02-05 DIAGNOSIS — L89610 Pressure ulcer of right heel, unstageable: Secondary | ICD-10-CM | POA: Diagnosis present

## 2023-02-05 DIAGNOSIS — Z992 Dependence on renal dialysis: Secondary | ICD-10-CM | POA: Diagnosis not present

## 2023-02-05 DIAGNOSIS — I340 Nonrheumatic mitral (valve) insufficiency: Secondary | ICD-10-CM | POA: Diagnosis present

## 2023-02-05 DIAGNOSIS — E1151 Type 2 diabetes mellitus with diabetic peripheral angiopathy without gangrene: Secondary | ICD-10-CM | POA: Diagnosis present

## 2023-02-05 DIAGNOSIS — I2489 Other forms of acute ischemic heart disease: Secondary | ICD-10-CM | POA: Diagnosis present

## 2023-02-05 DIAGNOSIS — R0600 Dyspnea, unspecified: Secondary | ICD-10-CM | POA: Diagnosis not present

## 2023-02-05 DIAGNOSIS — N250 Renal osteodystrophy: Secondary | ICD-10-CM | POA: Diagnosis present

## 2023-02-05 DIAGNOSIS — D696 Thrombocytopenia, unspecified: Secondary | ICD-10-CM | POA: Diagnosis present

## 2023-02-05 DIAGNOSIS — Z7901 Long term (current) use of anticoagulants: Secondary | ICD-10-CM | POA: Diagnosis not present

## 2023-02-05 DIAGNOSIS — I428 Other cardiomyopathies: Secondary | ICD-10-CM | POA: Diagnosis present

## 2023-02-05 DIAGNOSIS — I9589 Other hypotension: Secondary | ICD-10-CM | POA: Diagnosis present

## 2023-02-05 DIAGNOSIS — I132 Hypertensive heart and chronic kidney disease with heart failure and with stage 5 chronic kidney disease, or end stage renal disease: Secondary | ICD-10-CM | POA: Diagnosis present

## 2023-02-05 DIAGNOSIS — D631 Anemia in chronic kidney disease: Secondary | ICD-10-CM | POA: Diagnosis present

## 2023-02-05 DIAGNOSIS — I69854 Hemiplegia and hemiparesis following other cerebrovascular disease affecting left non-dominant side: Secondary | ICD-10-CM | POA: Diagnosis not present

## 2023-02-05 DIAGNOSIS — Z9981 Dependence on supplemental oxygen: Secondary | ICD-10-CM | POA: Diagnosis not present

## 2023-02-05 DIAGNOSIS — N186 End stage renal disease: Secondary | ICD-10-CM | POA: Diagnosis present

## 2023-02-05 DIAGNOSIS — N2581 Secondary hyperparathyroidism of renal origin: Secondary | ICD-10-CM | POA: Diagnosis present

## 2023-02-05 DIAGNOSIS — J4489 Other specified chronic obstructive pulmonary disease: Secondary | ICD-10-CM | POA: Diagnosis present

## 2023-02-05 DIAGNOSIS — I480 Paroxysmal atrial fibrillation: Secondary | ICD-10-CM | POA: Diagnosis present

## 2023-02-05 DIAGNOSIS — G4700 Insomnia, unspecified: Secondary | ICD-10-CM | POA: Diagnosis present

## 2023-02-05 DIAGNOSIS — E875 Hyperkalemia: Secondary | ICD-10-CM | POA: Diagnosis present

## 2023-02-05 LAB — RENAL FUNCTION PANEL
Albumin: 2.5 g/dL — ABNORMAL LOW (ref 3.5–5.0)
Anion gap: 17 — ABNORMAL HIGH (ref 5–15)
BUN: 28 mg/dL — ABNORMAL HIGH (ref 6–20)
CO2: 25 mmol/L (ref 22–32)
Calcium: 8.9 mg/dL (ref 8.9–10.3)
Chloride: 91 mmol/L — ABNORMAL LOW (ref 98–111)
Creatinine, Ser: 6.98 mg/dL — ABNORMAL HIGH (ref 0.61–1.24)
GFR, Estimated: 8 mL/min — ABNORMAL LOW (ref >60)
Glucose, Bld: 89 mg/dL (ref 70–99)
Phosphorus: 5.8 mg/dL — ABNORMAL HIGH (ref 2.5–4.6)
Potassium: 4.2 mmol/L (ref 3.5–5.1)
Sodium: 133 mmol/L — ABNORMAL LOW (ref 135–145)

## 2023-02-05 LAB — CBC
HCT: 44.8 % (ref 39.0–52.0)
Hemoglobin: 13.5 g/dL (ref 13.0–17.0)
MCH: 24.8 pg — ABNORMAL LOW (ref 26.0–34.0)
MCHC: 30.1 g/dL (ref 30.0–36.0)
MCV: 82.4 fL (ref 80.0–100.0)
Platelets: 108 10*3/uL — ABNORMAL LOW (ref 150–400)
RBC: 5.44 MIL/uL (ref 4.22–5.81)
RDW: 18.4 % — ABNORMAL HIGH (ref 11.5–15.5)
WBC: 5.6 10*3/uL (ref 4.0–10.5)
nRBC: 0 % (ref 0.0–0.2)

## 2023-02-05 LAB — BASIC METABOLIC PANEL
Anion gap: 22 — ABNORMAL HIGH (ref 5–15)
BUN: 67 mg/dL — ABNORMAL HIGH (ref 6–20)
CO2: 19 mmol/L — ABNORMAL LOW (ref 22–32)
Calcium: 8.8 mg/dL — ABNORMAL LOW (ref 8.9–10.3)
Chloride: 95 mmol/L — ABNORMAL LOW (ref 98–111)
Creatinine, Ser: 10.97 mg/dL — ABNORMAL HIGH (ref 0.61–1.24)
GFR, Estimated: 5 mL/min — ABNORMAL LOW (ref >60)
Glucose, Bld: 72 mg/dL (ref 70–99)
Potassium: 6.3 mmol/L (ref 3.5–5.1)
Sodium: 136 mmol/L (ref 135–145)

## 2023-02-05 LAB — RESP PANEL BY RT-PCR (RSV, FLU A&B, COVID)  RVPGX2
Influenza A by PCR: NEGATIVE
Influenza B by PCR: NEGATIVE
Resp Syncytial Virus by PCR: NEGATIVE
SARS Coronavirus 2 by RT PCR: NEGATIVE

## 2023-02-05 LAB — HEPATITIS B SURFACE ANTIGEN: Hepatitis B Surface Ag: NONREACTIVE

## 2023-02-05 LAB — BRAIN NATRIURETIC PEPTIDE: B Natriuretic Peptide: 3087.6 pg/mL — ABNORMAL HIGH (ref 0.0–100.0)

## 2023-02-05 LAB — TROPONIN I (HIGH SENSITIVITY): Troponin I (High Sensitivity): 120 ng/L (ref <18)

## 2023-02-05 MED ORDER — ACETAMINOPHEN 325 MG PO TABS
650.0000 mg | ORAL_TABLET | Freq: Four times a day (QID) | ORAL | Status: DC | PRN
  Administered 2023-02-05: 650 mg via ORAL
  Filled 2023-02-05: qty 2

## 2023-02-05 MED ORDER — PROCHLORPERAZINE EDISYLATE 10 MG/2ML IJ SOLN: 5.0000 mg | Freq: Four times a day (QID) | INTRAMUSCULAR | Status: DC | PRN

## 2023-02-05 MED ORDER — DEXTROSE 50 % IV SOLN
1.0000 | Freq: Once | INTRAVENOUS | Status: AC
Start: 2023-02-05 — End: 2023-02-05
  Administered 2023-02-05: 50 mL via INTRAVENOUS
  Filled 2023-02-05: qty 50

## 2023-02-05 MED ORDER — SODIUM ZIRCONIUM CYCLOSILICATE 10 G PO PACK
10.0000 g | PACK | Freq: Once | ORAL | Status: AC
  Administered 2023-02-05: 10 g via ORAL
  Filled 2023-02-05: qty 1

## 2023-02-05 MED ORDER — INSULIN ASPART 100 UNIT/ML IV SOLN
5.0000 [IU] | Freq: Once | INTRAVENOUS | Status: AC
Start: 2023-02-05 — End: 2023-02-05
  Administered 2023-02-05: 5 [IU] via INTRAVENOUS

## 2023-02-05 MED ORDER — CHLORHEXIDINE GLUCONATE CLOTH 2 % EX PADS
6.0000 | MEDICATED_PAD | Freq: Every day | CUTANEOUS | Status: DC
  Administered 2023-02-05: 6 via TOPICAL

## 2023-02-05 MED ORDER — CALCIUM GLUCONATE 10 % IV SOLN
1.0000 g | Freq: Once | INTRAVENOUS | Status: AC
Start: 2023-02-05 — End: 2023-02-05
  Administered 2023-02-05: 1 g via INTRAVENOUS
  Filled 2023-02-05: qty 10

## 2023-02-05 MED ORDER — APIXABAN 5 MG PO TABS
5.0000 mg | ORAL_TABLET | Freq: Two times a day (BID) | ORAL | Status: DC
Start: 2023-02-05 — End: 2023-02-06
  Administered 2023-02-05 – 2023-02-06 (×3): 5 mg via ORAL
  Filled 2023-02-05 (×3): qty 1

## 2023-02-05 MED ORDER — ALBUTEROL SULFATE (2.5 MG/3ML) 0.083% IN NEBU: 2.5000 mg | INHALATION_SOLUTION | Freq: Four times a day (QID) | RESPIRATORY_TRACT | Status: DC | PRN

## 2023-02-05 MED ORDER — MELATONIN 5 MG PO TABS: 5.0000 mg | ORAL_TABLET | Freq: Every evening | ORAL | Status: DC | PRN

## 2023-02-05 MED ORDER — POLYETHYLENE GLYCOL 3350 17 G PO PACK
17.0000 g | PACK | Freq: Every day | ORAL | Status: DC | PRN
  Filled 2023-02-05: qty 1

## 2023-02-05 MED ORDER — SODIUM BICARBONATE 8.4 % IV SOLN
50.0000 meq | Freq: Once | INTRAVENOUS | Status: AC
Start: 2023-02-05 — End: 2023-02-05
  Administered 2023-02-05: 50 meq via INTRAVENOUS
  Filled 2023-02-05: qty 50

## 2023-02-05 MED ORDER — MIDODRINE HCL 5 MG PO TABS
5.0000 mg | ORAL_TABLET | Freq: Three times a day (TID) | ORAL | Status: DC
  Administered 2023-02-05 – 2023-02-06 (×3): 5 mg via ORAL
  Filled 2023-02-05 (×3): qty 1

## 2023-02-05 NOTE — H&P (Addendum)
History and Physical  Nathaniel White JYN:[id number] DOB: [birth year] DOA: 02/04/2023  Referring physician: Dr. Pilar Plate, EDP  PCP: Claiborne Rigg, NP  Outpatient Specialists: Nephrology Patient coming from: Home  Chief Complaint: Shortness of breath.  HPI: Nathaniel White is a 63 y.o. male with medical history significant for ESRD on HD TTS, chronic hypoxia on 2.5 L nasal cannula continuously at baseline, chronic HFrEF 35-40%, paroxysmal A-fib on Eliquis, chronic hypotension on midodrine, who presented to the ER with complaints of shortness of breath that started today.  Has been compliant with his hemodialysis sessions.  No chest pain reported.  He presented to the ED for further evaluation.  In the ER, BNP elevated greater than 3000, volume overload on exam.  Chest x-ray showing cardiomegaly, pulmonary edema, and trace right pleural effusion.  EDP discussed the case with nephrology who will initiate hemodialysis while in the hospital.  Admitted by [facility], hospitalist service.  The patient was seen at our HD center and felt better with improved dyspnea while on hemodialysis.  ED Course: Temperature 97.9.  BP 127/74, heart rate 47, respiratory 17, O2 saturation 94% on 3 L.  Lab studies notable for serum potassium 6.3, serum bicarb 19, anion gap 22.  Platelet count 111.  Review of Systems: Review of systems as noted in the HPI. All other systems reviewed and are negative.   Past Medical History:  Diagnosis Date   Anemia    Anxiety    Asthma    Complication from renal dialysis device 11/02/2013   Complication of anesthesia 2017   according to pt and spouse pt was moving around and cough while under and pt had difficulty waking up so the anesthesia had to be reversed. and pt admitted to ICU.    COPD (chronic obstructive pulmonary disease) (HCC)    Diabetes mellitus without complication (HCC)    Type II - Patient states he does not have diabetes, "they said sometimes when you start dialysis  you don't have diabetes any longer"    ESOPHAGEAL VARICES 10/04/2008   Qualifier: Diagnosis of  By: Russella Dar MD Marylu Lund    ESRD    on HD, T-TH-Sat - [place]   Hemiparesis due to old stroke [facility])    left   HFrEF (heart failure with reduced ejection fraction) (HCC)    NICM // TTE 06/2022: EF 25-30, sever RV dysfunction, mod to severe MR   Hypertension    Hx, not current problems, no meds   LV dysfunction    EF 25-30% by echo 07/2011   Memory loss due to medical condition    due to stroke   MR (mitral regurgitation)    moderate to severe, echo 07/2011   Nausea with vomiting, unspecified 06/15/2021   Paroxysmal atrial fibrillation (HCC)    Peripheral vascular disease (HCC)    Shortness of breath    with exertion   Stroke (HCC)    TIA's-left sided weakness   Tobacco abuse    Past Surgical History:  Procedure Laterality Date   ABDOMINAL AORTOGRAM W/LOWER EXTREMITY Bilateral 07/21/2018   Procedure: ABDOMINAL AORTOGRAM W/LOWER EXTREMITY;  Surgeon: Maeola Harman, MD;  Location: [facility] INVASIVE CV LAB;  Service: Cardiovascular;  Laterality: Bilateral;   AMPUTATION Left 11/05/2018   Procedure: AMPUTATION SECOND TOE LEFT FOOT;  Surgeon: Maeola Harman, MD;  Location: [facility] OR;  Service: Vascular;  Laterality: Left;   AMPUTATION Left 02/13/2021   Procedure: Left small finger AMPUTATION DIGIT;  Surgeon: Marlyne Beards, MD;  Location: [facility] OR;  Service: Orthopedics;  Laterality: Left;   AV FISTULA PLACEMENT     CARDIAC CATHETERIZATION     [place] [facility]   COLONOSCOPY W/ BIOPSIES AND POLYPECTOMY     FISTULA SUPERFICIALIZATION Left 11/10/2013   Procedure: FISTULA PLICATION;  Surgeon: Nada Libman, MD;  Location: [facility] OR;  Service: Vascular;  Laterality: Left;   INSERTION OF DIALYSIS CATHETER N/A 06/20/2021   Procedure: INSERTION OF DIALYSIS CATHETER;  Surgeon: Victorino Sparrow, MD;  Location: [facility] OR;  Service: Vascular;  Laterality: N/A;   KIDNEY TRANSPLANT     2011  rejected kidney 2012 back on dialysis   LEFT AND RIGHT HEART CATHETERIZATION WITH CORONARY ANGIOGRAM N/A 10/09/2013   Procedure: LEFT AND RIGHT HEART CATHETERIZATION WITH CORONARY ANGIOGRAM;  Surgeon: Kathleene Hazel, MD;  Location: [facility] CATH LAB;  Service: Cardiovascular;  Laterality: N/A;   LIGATION OF ARTERIOVENOUS  FISTULA Left 06/20/2021   Procedure: LIGATION OF ARTERIOVENOUS  FISTULA;  Surgeon: Victorino Sparrow, MD;  Location: [facility] OR;  Service: Vascular;  Laterality: Left;   PERIPHERAL VASCULAR INTERVENTION Left 07/21/2018   Procedure: PERIPHERAL VASCULAR INTERVENTION;  Surgeon: Maeola Harman, MD;  Location: [facility] INVASIVE CV LAB;  Service: Cardiovascular;  Laterality: Left;  Popliteal   REVISON OF ARTERIOVENOUS FISTULA Left 08/26/2013   Procedure: EXCISION OF ERODED SKIN AND EXPLORATION OF MAIN LEFT UPPER ARM AV FISTULA;  Surgeon: Larina Earthly, MD;  Location: [facility] OR;  Service: Vascular;  Laterality: Left;   REVISON OF ARTERIOVENOUS FISTULA Left 02/05/2014   Procedure: REPAIR OF ARTERIOVENOUS FISTULA ANEURYSM;  Surgeon: Nada Libman, MD;  Location: [facility] OR;  Service: Vascular;  Laterality: Left;   REVISON OF ARTERIOVENOUS FISTULA Left 03/10/2021   Procedure: LEFT ARM REVISION OF ARTERIOVENOUS FISTULA WITH PLICATION;  Surgeon: Maeola Harman, MD;  Location: [facility] OR;  Service: Vascular;  Laterality: Left;   RIGHT/LEFT HEART CATH AND CORONARY ANGIOGRAPHY N/A 06/21/2022   Procedure: RIGHT/LEFT HEART CATH AND CORONARY ANGIOGRAPHY;  Surgeon: Lennette Bihari, MD;  Location: [facility] INVASIVE CV LAB;  Service: Cardiovascular;  Laterality: N/A;   SCROTAL EXPLORATION N/A 09/16/2022   Procedure: PENILE DEBRIDMENT;  Surgeon: Rene Paci, MD;  Location: [facility] OR;  Service: Urology;  Laterality: N/A;   SHUNTOGRAM N/A 11/05/2012   Procedure: Fistulogram;  Surgeon: Nada Libman, MD;  Location: [facility] CATH LAB;  Service: Cardiovascular;  Laterality: N/A;   TEE WITHOUT CARDIOVERSION  08/17/2011    Procedure: TRANSESOPHAGEAL ECHOCARDIOGRAM (TEE);  Surgeon: Laurey Morale, MD;  Location: [facility] ENDOSCOPY;  Service: Cardiovascular;  Laterality: N/A;   ULTRASOUND GUIDANCE FOR VASCULAR ACCESS  06/20/2021   Procedure: ULTRASOUND GUIDANCE FOR VASCULAR ACCESS;  Surgeon: Victorino Sparrow, MD;  Location: [facility] OR;  Service: Vascular;;   VIDEO ASSISTED THORACOSCOPY (VATS)/DECORTICATION  08/10/11    Social History:  reports that he quit smoking about 11 years ago. His smoking use included cigarettes. He started smoking about 26 years ago. He has a 7.5 pack-year smoking history. He has never used smokeless tobacco. He reports that he does not drink alcohol and does not use drugs.   Allergies  Allergen Reactions   Codeine Shortness Of Breath   Dextromethorphan-Guaifenesin Shortness Of Breath and Other (See Comments)   Iron Dextran Shortness Of Breath and Other (See Comments)   Morphine And Codeine Shortness Of Breath    Other Reaction(s): Other (See Comments)    Family History  Problem Relation Age of Onset   Hypertension Mother  Varicose Veins Mother    Diabetes Paternal Grandmother    Cancer Paternal Grandfather    CAD Paternal Uncle       Prior to Admission medications   Medication Sig Start Date End Date Taking? Authorizing Provider  acetaminophen (TYLENOL) 500 MG tablet Take 1 tablet (500 mg total) by mouth every 6 (six) hours as needed. Patient taking differently: Take 500 mg by mouth every 6 (six) hours as needed for mild pain (pain score 1-3). 04/11/18   Georgetta Haber, NP  albuterol (PROAIR HFA) 108 (90 Base) MCG/ACT inhaler Inhale 2 puffs into the lungs every 6 (six) hours as needed for wheezing or shortness of breath. 11/30/22   Rai, Delene Ruffini, MD  amiodarone (PACERONE) 200 MG tablet Take 1 tablet (200 mg total) by mouth daily. 09/25/22   Billey Co, MD  ELIQUIS 5 MG TABS tablet Take 5 mg by mouth 2 (two) times daily. 12/09/21   [provider]  midodrine (PROAMATINE)  10 MG tablet Take 1 tablet (10 mg total) by mouth 3 (three) times daily with meals. Patient taking differently: Take 10 mg by mouth 3 (three) times daily. TAKE 30 MINUTES BEFORE HEMODIALYSIS. TAKE 1 EXTRA TABLET MID TREATMENT 09/24/22   Alfredo Martinez, MD  Misc. Devices MISC Please provide patient with insurance approved Slide transfer board with handles. ICD10 I50.20 R53.1 J96.21 J96.22 01/31/23   Claiborne Rigg, NP  polyethylene glycol (MIRALAX) 17 g packet Take 17 g by mouth daily as needed for moderate constipation or mild constipation. Also available over-the-counter Patient not taking: Reported on 01/15/2023 11/30/22   Rai, Delene Ruffini, MD  senna-docusate (SENOKOT-S) 8.6-50 MG tablet Take 2 tablets by mouth at bedtime. For constipation Patient not taking: Reported on 01/15/2023 11/30/22   Rai, Delene Ruffini, MD  traZODone (DESYREL) 50 MG tablet Take 50 mg by mouth at bedtime as needed for sleep. Patient not taking: Reported on 01/15/2023    [provider]  VELPHORO 500 MG chewable tablet Chew 1,000 mg by mouth 2 (two) times daily after a meal. 02/06/21   [provider]  VISINE 0.05 % ophthalmic solution Place 2-3 drops into both eyes 2 (two) times daily as needed (dry eye). 07/04/21   [provider]    Physical Exam: BP 125/79   Pulse (!) 53   Temp 97.9 F (36.6 C) (Oral)   Resp 16   Wt 74.8 kg   SpO2 96%   BMI 24.37 kg/m   General: 63 y.o. year-old male well developed well nourished in no acute distress.  Alert and oriented x3. Cardiovascular: Regular rate and rhythm with no rubs or gallops.  No thyromegaly or JVD noted.  No lower extremity edema. 2/4 pulses in all 4 extremities. Respiratory: Very faint rales diffusely with poor inspiratory effort. Abdomen: Soft nontender nondistended with normal bowel sounds x4 quadrants. Muskuloskeletal: No cyanosis, clubbing or edema noted bilaterally Neuro: CN II-XII intact, strength, sensation, reflexes Skin: No ulcerative  lesions noted or rashes Psychiatry: Judgement and insight appear normal. Mood is appropriate for condition and setting          Labs on Admission:  Basic Metabolic Panel: Recent Labs  Lab 02/04/23 2353  NA 136  K 6.3*  CL 95*  CO2 19*  GLUCOSE 72  BUN 67*  CREATININE 10.97*  CALCIUM 8.8*   Liver Function Tests: No results for input(s): "AST", "ALT", "ALKPHOS", "BILITOT", "PROT", "ALBUMIN" in the last 168 hours. No results for input(s): "LIPASE", "AMYLASE" in  the last 168 hours. No results for input(s): "AMMONIA" in the last 168 hours. CBC: Recent Labs  Lab 02/04/23 2240  WBC 7.8  HGB 13.0  HCT 43.6  MCV 83.8  PLT 111*   Cardiac Enzymes: No results for input(s): "CKTOTAL", "CKMB", "CKMBINDEX", "TROPONINI" in the last 168 hours.  BNP (last 3 results) Recent Labs    12/27/22 1443 01/15/23 0325 02/04/23 2353  BNP 2,332.5* 3,540.1* 3,087.6*    ProBNP (last 3 results) No results for input(s): "PROBNP" in the last 8760 hours.  CBG: No results for input(s): "GLUCAP" in the last 168 hours.  Radiological Exams on Admission: DG Chest 2 View Result Date: 02/04/2023 CLINICAL DATA:  Shortness of breath EXAM: CHEST - 2 VIEW COMPARISON:  Chest x-ray 01/16/2023.  Chest CT 12/27/2022. FINDINGS: Left-sided central venous catheter tip projects over the cavoatrial junction. The heart is enlarged. There central pulmonary vascular congestion and central interstitial opacities bilaterally. Linear opacities in the right lower lobe appear similar to the prior study. There is a trace right pleural effusion. There is no pneumothorax. No acute fractures are seen. IMPRESSION: 1. Cardiomegaly with central pulmonary vascular congestion and central interstitial opacities bilaterally, likely pulmonary edema. 2. Trace right pleural effusion. 3. Linear opacities in the right lower lobe appear similar to the prior study, likely atelectasis. Electronically Signed   By: Darliss Cheney M.D.   On:  02/04/2023 22:47    EKG: I independently viewed the EKG done and my findings are as followed: Wide QRS rhythm rate of 48.  Nonspecific ST-T changes QTc 497.  Assessment/Plan Present on Admission:  Dyspnea  Principal Problem:   Dyspnea  Dyspnea, likely secondary to pulmonary edema Pulmonary edema seen on chest x-ray, personally reviewed Volume status managed with hemodialysis Nephrology consulted with plan for hemodialysis today Maintain O2 saturation above 90%  Acute on chronic hypoxic respiratory failure secondary to the above At baseline 2.5 L nasal cannula continuously currently requiring 3 L to maintain O2 saturation above 92% Start incentive spirometer Early mobilization PT to evaluate  Paroxysmal A-fib on Eliquis Resume home Eliquis Hold off home amiodarone for rate control due to bradycardia.  Bradycardia Hold off home amiodarone and midodrine to avoid worsening of bradycardia. BP is currently stable 127/74 with MAP greater than 80 Continue to monitor on telemetry  Situational insomnia As needed melatonin  General Weakness PT to evaluate Fall precautions   Time: 75 minutes.   DVT prophylaxis: Home Eliquis  Code Status: Full code  Family Communication: None at bedside  Disposition Plan: Admitted to telemetry medical unit  Consults called: Nephrology  Admission status: Observation status   Status is: Observation    Darlin Drop MD [facility] Pager [phone]  If 7PM-7AM, please contact night-coverage [email or web address] Password [email or web address]  02/05/2023, 1:25 AM

## 2023-02-05 NOTE — Progress Notes (Signed)
Pt admitted to the unit from ED via stretcher with belongings to the side. Pt A&O 4, pt oriented to the unit, room and call light; telemetry applied and verified with CCMD, Skin assessment completed with skin RN per protocol. Existing pressure injury unstageable noted to right heel, foam dsg applied. Left heel intact but flaky. Sacral foam dsg applied to sacrum prophylactic. Left knee has an old scabbed wound and foam dsg applied. VSS; report given to oncoming RN. Dionne Bucy RN   02/05/23 1900  Vitals  Temp 98.9 F (37.2 C)  Temp Source Oral  BP (!) 105/59  MAP (mmHg) 72  BP Location Right Arm  BP Method Automatic  Patient Position (if appropriate) Lying  Pulse Rate (!) 58  Pulse Rate Source Monitor  ECG Heart Rate 76  Resp 18  MEWS COLOR  MEWS Score Color Green  Oxygen Therapy  SpO2 95 %  O2 Device Nasal Cannula  O2 Flow Rate (L/min) 3 L/min  Pain Assessment  Pain Scale 0-10  Pain Score 0  MEWS Score  MEWS Temp 0  MEWS Systolic 0  MEWS Pulse 0  MEWS RR 0  MEWS LOC 0  MEWS Score 0

## 2023-02-05 NOTE — Consult Note (Addendum)
[facility] Renal Consultation Note  Indication for Consultation:  Management of ESRD/hemodialysis; anemia, hypertension/volume and secondary hyperparathyroidism  HPI: Nathaniel White is a 63 y.o. male with ESRD on chronic hemodialysis [facility] TTS, compliant with HD recently other medical problems as noted below.  Brought in by EMS last night with shortness of breath despite 2.5 L O2 at home, chest x-ray showing pulmonary edema right pleural effusion cardiomegaly, potassium 6.3.  He had emergent hemodialysis with 2 L UF.  Currently in ER hallway denies shortness of breath stated he had too much fluid on.  Denies chest pain abdominal pain fevers chills.     Past Medical History:  Diagnosis Date   Anemia    Anxiety    Asthma    Complication from renal dialysis device 11/02/2013   Complication of anesthesia 2017   according to pt and spouse pt was moving around and cough while under and pt had difficulty waking up so the anesthesia had to be reversed. and pt admitted to ICU.    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/2008   Qualifier: Diagnosis of  By: Russella Dar MD Marylu Lund    ESRD    on HD, T-TH-Sat - [facility]   Hemiparesis due to old stroke [facility])    left   HFrEF (heart failure with reduced ejection fraction) (HCC)    NICM // TTE 06/2022: EF 25-30, sever RV dysfunction, mod to severe MR   Hypertension    Hx, not current problems, no meds   LV dysfunction    EF 25-30% by echo 07/2011   Memory loss due to medical condition    due to stroke   MR (mitral regurgitation)    moderate to severe, echo 07/2011   Nausea with vomiting, unspecified 06/15/2021   Paroxysmal atrial fibrillation (HCC)    Peripheral vascular disease (HCC)    Shortness  of breath    with exertion   Stroke (HCC)    TIA's-left sided weakness   Tobacco abuse     Past Surgical History:  Procedure Laterality Date   ABDOMINAL AORTOGRAM W/LOWER EXTREMITY Bilateral 07/21/2018   Procedure: ABDOMINAL AORTOGRAM W/LOWER EXTREMITY;  Surgeon: Maeola Harman, MD;  Location: [facility] INVASIVE CV LAB;  Service: Cardiovascular;  Laterality: Bilateral;   AMPUTATION Left 11/05/2018   Procedure: AMPUTATION SECOND TOE LEFT FOOT;  Surgeon: Maeola Harman, MD;  Location: [facility] OR;  Service: Vascular;  Laterality: Left;   AMPUTATION Left 02/13/2021   Procedure: Left small finger AMPUTATION DIGIT;  Surgeon: Marlyne Beards, MD;  Location: [facility] OR;  Service: Orthopedics;  Laterality: Left;   AV FISTULA PLACEMENT     CARDIAC CATHETERIZATION     [facility]   COLONOSCOPY W/ BIOPSIES AND POLYPECTOMY     FISTULA SUPERFICIALIZATION Left 11/10/2013   Procedure: FISTULA PLICATION;  Surgeon: Nada Libman, MD;  Location: [facility] OR;  Service: Vascular;  Laterality: Left;   INSERTION OF DIALYSIS CATHETER N/A 06/20/2021   Procedure: INSERTION OF DIALYSIS CATHETER;  Surgeon: Victorino Sparrow, MD;  Location: [facility] OR;  Service: Vascular;  Laterality: N/A;   KIDNEY TRANSPLANT     2011 rejected kidney 2012 back on dialysis   LEFT AND RIGHT HEART CATHETERIZATION WITH CORONARY ANGIOGRAM N/A 10/09/2013  Procedure: LEFT AND RIGHT HEART CATHETERIZATION WITH CORONARY ANGIOGRAM;  Surgeon: Kathleene Hazel, MD;  Location: [facility] CATH LAB;  Service: Cardiovascular;  Laterality: N/A;   LIGATION OF ARTERIOVENOUS  FISTULA Left 06/20/2021   Procedure: LIGATION OF ARTERIOVENOUS  FISTULA;  Surgeon: Victorino Sparrow, MD;  Location: [facility] OR;  Service: Vascular;  Laterality: Left;   PERIPHERAL VASCULAR INTERVENTION Left 07/21/2018   Procedure: PERIPHERAL VASCULAR INTERVENTION;  Surgeon: Maeola Harman, MD;  Location: [facility] INVASIVE CV LAB;  Service: Cardiovascular;  Laterality: Left;  Popliteal    REVISON OF ARTERIOVENOUS FISTULA Left 08/26/2013   Procedure: EXCISION OF ERODED SKIN AND EXPLORATION OF MAIN LEFT UPPER ARM AV FISTULA;  Surgeon: Larina Earthly, MD;  Location: [facility] OR;  Service: Vascular;  Laterality: Left;   REVISON OF ARTERIOVENOUS FISTULA Left 02/05/2014   Procedure: REPAIR OF ARTERIOVENOUS FISTULA ANEURYSM;  Surgeon: Nada Libman, MD;  Location: [facility] OR;  Service: Vascular;  Laterality: Left;   REVISON OF ARTERIOVENOUS FISTULA Left 03/10/2021   Procedure: LEFT ARM REVISION OF ARTERIOVENOUS FISTULA WITH PLICATION;  Surgeon: Maeola Harman, MD;  Location: [facility] OR;  Service: Vascular;  Laterality: Left;   RIGHT/LEFT HEART CATH AND CORONARY ANGIOGRAPHY N/A 06/21/2022   Procedure: RIGHT/LEFT HEART CATH AND CORONARY ANGIOGRAPHY;  Surgeon: Lennette Bihari, MD;  [facility] INVASIVE CV LAB;  Service: Cardiovascular;  Laterality: N/A;   SCROTAL EXPLORATION N/A 09/16/2022   Procedure: PENILE DEBRIDMENT;  Surgeon: Rene Paci, MD;  Location: [facility] OR;  Service: Urology;  Laterality: N/A;   SHUNTOGRAM N/A 11/05/2012   Procedure: Fistulogram;  Surgeon: Nada Libman, MD;  Location: [facility] CATH LAB;  Service: Cardiovascular;  Laterality: N/A;   TEE WITHOUT CARDIOVERSION  08/17/2011   Procedure: TRANSESOPHAGEAL ECHOCARDIOGRAM (TEE);  Surgeon: Laurey Morale, MD;  Location: [facility] ENDOSCOPY;  Service: Cardiovascular;  Laterality: N/A;   ULTRASOUND GUIDANCE FOR VASCULAR ACCESS  06/20/2021   Procedure: ULTRASOUND GUIDANCE FOR VASCULAR ACCESS;  Surgeon: Victorino Sparrow, MD;  Location: [facility] OR;  Service: Vascular;;   VIDEO ASSISTED THORACOSCOPY (VATS)/DECORTICATION  08/10/11      Family History  Problem Relation Age of Onset   Hypertension Mother    Varicose Veins Mother    Diabetes Paternal Grandmother    Cancer Paternal Grandfather    CAD Paternal Uncle       reports that he quit smoking about 11 years ago. His smoking use included cigarettes. He started smoking about 26 years ago.  He has a 7.5 pack-year smoking history. He has never used smokeless tobacco. He reports that he does not drink alcohol and does not use drugs.   Allergies  Allergen Reactions   Codeine Shortness Of Breath   Dextromethorphan-Guaifenesin Shortness Of Breath and Other (See Comments)   Iron Dextran Shortness Of Breath and Other (See Comments)    Pt does not recall 02/05/23   Morphine And Codeine Shortness Of Breath    Other Reaction(s): Other (See Comments)    Prior to Admission medications   Medication Sig Start Date End Date Taking? Authorizing Provider  acetaminophen (TYLENOL) 500 MG tablet Take 1 tablet (500 mg total) by mouth every 6 (six) hours as needed. Patient taking differently: Take 500 mg by mouth every 6 (six) hours as needed for mild pain (pain score 1-3). 04/11/18  Yes Burky, Barron Alvine, NP  albuterol (PROAIR HFA) 108 (90 Base) MCG/ACT inhaler Inhale 2 puffs into the lungs every 6 (six) hours as needed for wheezing or shortness of  breath. 11/30/22  Yes Rai, Ripudeep K, MD  amiodarone (PACERONE) 200 MG tablet Take 1 tablet (200 mg total) by mouth daily. 09/25/22  Yes Pray, Milus Mallick, MD  ELIQUIS 5 MG TABS tablet Take 5 mg by mouth 2 (two) times daily. 12/09/21  Yes [provider]  levocetirizine (XYZAL) 5 MG tablet Take 5 mg by mouth daily as needed for allergies.   Yes [provider]  midodrine (PROAMATINE) 10 MG tablet Take 1 tablet (10 mg total) by mouth 3 (three) times daily with meals. Patient taking differently: Take 10 mg by mouth 3 (three) times daily. Take one tablet by mouth three times a week as directed. Take one tablet by mouth 30 minutes before hemodialysis on Tuesday, Thursday and Saturday. Take one extra tablet by mouth mid treatment. 09/24/22  Yes Alfredo Martinez, MD  midodrine (PROAMATINE) 5 MG tablet Take 5 mg by mouth 3 (three) times daily. Take one tablet by mouth three times daily on non-dialysis days. Monday, Wednesday, Friday, and Sunday. 01/21/23   Yes [provider]  Tenapanor HCl, CKD, 30 MG TABS Take 1 tablet by mouth daily.   Yes [provider]  VELPHORO 500 MG chewable tablet Chew 1,000 mg by mouth 2 (two) times daily after a meal. 02/06/21  Yes [provider]  VISINE 0.05 % ophthalmic solution Place 2-3 drops into both eyes 2 (two) times daily as needed (dry eye). 07/04/21  Yes [provider]      Results for orders placed or performed during the hospital encounter of 02/04/23 (from the past 48 hours)  CBC     Status: Abnormal   Collection Time: 02/04/23 10:40 PM  Result Value Ref Range   WBC 7.8 4.0 - 10.5 K/uL   RBC 5.20 4.22 - 5.81 MIL/uL   Hemoglobin 13.0 13.0 - 17.0 g/dL   HCT 91.4 78.2 - 95.6 %   MCV 83.8 80.0 - 100.0 fL   MCH 25.0 (L) 26.0 - 34.0 pg   MCHC 29.8 (L) 30.0 - 36.0 g/dL   RDW 21.3 (H) 08.6 - 57.8 %   Platelets 111 (L) 150 - 400 K/uL    Comment: REPEATED TO VERIFY   nRBC 0.0 0.0 - 0.2 %    Comment: Performed at [facility], [address] [place], [place] [address]  Troponin I (High Sensitivity)     Status: Abnormal   Collection Time: 02/04/23 10:40 PM  Result Value Ref Range   Troponin I (High Sensitivity) 132 (HH) <18 ng/L    Comment: CRITICAL RESULT CALLED TO, READ BACK BY AND VERIFIED WITH E.VARENHORST RN [id number] 02/04/2023 BY G.GANADEN (NOTE) Elevated high sensitivity troponin I (hsTnI) values and significant  changes across serial measurements may suggest ACS but many other  chronic and acute conditions are known to elevate hsTnI results.  Refer to the "Links" section for chest pain algorithms and additional  guidance. Performed at [facility], [address] [place], [place] [address]   Troponin I (High Sensitivity)     Status: Abnormal   Collection Time: 02/04/23 11:53 PM  Result Value Ref Range   Troponin I (High Sensitivity) 120 (HH) <18 ng/L    Comment: CRITICAL VALUE NOTED. VALUE IS CONSISTENT WITH PREVIOUSLY REPORTED/CALLED  VALUE (NOTE) Elevated high sensitivity troponin I (hsTnI) values and significant  changes across serial measurements may suggest ACS but many other  chronic and acute conditions are known to elevate hsTnI results.  Refer to the "Links" section  for chest pain algorithms and additional  guidance. Performed at [facility], [address], [place], [place] [address]   Brain natriuretic peptide     Status: Abnormal   Collection Time: 02/04/23 11:53 PM  Result Value Ref Range   B Natriuretic Peptide 3,087.6 (H) 0.0 - 100.0 pg/mL    Comment: Performed at [facility], [address] [place], [place] [address]  Basic metabolic panel     Status: Abnormal   Collection Time: 02/04/23 11:53 PM  Result Value Ref Range   Sodium 136 135 - 145 mmol/L   Potassium 6.3 (HH) 3.5 - 5.1 mmol/L    Comment: CRITICAL RESULT CALLED TO, READ BACK BY AND VERIFIED WITH: V.VALDEZ RN @0857  01.28.2025 E.AHMED    Chloride 95 (L) 98 - 111 mmol/L   CO2 19 (L) 22 - 32 mmol/L   Glucose, Bld 72 70 - 99 mg/dL    Comment: Glucose reference range applies only to samples taken after fasting for at least 8 hours.   BUN 67 (H) 6 - 20 mg/dL   Creatinine, Ser 82.95 (H) 0.61 - 1.24 mg/dL   Calcium 8.8 (L) 8.9 - 10.3 mg/dL   GFR, Estimated 5 (L) >60 mL/min    Comment: (NOTE) Calculated using the CKD-EPI Creatinine Equation (2021)    Anion gap 22 (H) 5 - 15    Comment: ELECTROLYTES REPEATED TO VERIFY Performed at [facility], [address], [place], [place] [address]   Resp panel by RT-PCR (RSV, Flu A&B, Covid) Anterior Nasal Swab     Status: None   Collection Time: 02/05/23 12:48 AM   Specimen: Anterior Nasal Swab  Result Value Ref Range   SARS Coronavirus 2 by RT PCR NEGATIVE NEGATIVE   Influenza A by PCR NEGATIVE NEGATIVE   Influenza B by PCR NEGATIVE NEGATIVE    Comment: (NOTE) The Xpert Xpress SARS-CoV-2/FLU/RSV plus assay is intended as an aid in the diagnosis of influenza from  Nasopharyngeal swab specimens and should not be used as a sole basis for treatment. Nasal washings and aspirates are unacceptable for Xpert Xpress SARS-CoV-2/FLU/RSV testing.  Fact Sheet for Patients: BloggerCourse.com  Fact Sheet for Healthcare Providers: SeriousBroker.it  This test is not yet approved or cleared by the [place] FDA and has been authorized for detection and/or diagnosis of SARS-CoV-2 by FDA under an Emergency Use Authorization (EUA). This EUA will remain in effect (meaning this test can be used) for the duration of the COVID-19 declaration under Section 564(b)(1) of the Act, 21 U.S.C. section 360bbb-3(b)(1), unless the authorization is terminated or revoked.     Resp Syncytial Virus by PCR NEGATIVE NEGATIVE    Comment: (NOTE) Fact Sheet for Patients: BloggerCourse.com  Fact Sheet for Healthcare Providers: SeriousBroker.it  This test is not yet approved or cleared by the [place] FDA and has been authorized for detection and/or diagnosis of SARS-CoV-2 by FDA under an Emergency Use Authorization (EUA). This EUA will remain in effect (meaning this test can be used) for the duration of the COVID-19 declaration under Section 564(b)(1) of the Act, 21 U.S.C. section 360bbb-3(b)(1), unless the authorization is terminated or revoked.  Performed at [facility], [address], [place], [place] [address]   Hepatitis B surface antigen     Status: None   Collection Time: 02/05/23  9:54 AM  Result Value Ref Range   Hepatitis B Surface Ag NON REACTIVE NON REACTIVE    Comment: Performed at [facility]  [facility], [address], [place], [place] [address]  CBC     Status: Abnormal   Collection Time: 02/05/23  9:54 AM  Result Value Ref Range   WBC 5.6 4.0 - 10.5 K/uL   RBC 5.44 4.22 - 5.81 MIL/uL   Hemoglobin 13.5 13.0 - 17.0 g/dL   HCT 60.4 54.0 - 98.1 %    MCV 82.4 80.0 - 100.0 fL   MCH 24.8 (L) 26.0 - 34.0 pg   MCHC 30.1 30.0 - 36.0 g/dL   RDW 19.1 (H) 47.8 - 29.5 %   Platelets 108 (L) 150 - 400 K/uL    Comment: REPEATED TO VERIFY   nRBC 0.0 0.0 - 0.2 %    Comment: Performed at [facility], [address], [place], [place] [address]  Renal function panel     Status: Abnormal   Collection Time: 02/05/23  9:54 AM  Result Value Ref Range   Sodium 133 (L) 135 - 145 mmol/L   Potassium 4.2 3.5 - 5.1 mmol/L   Chloride 91 (L) 98 - 111 mmol/L   CO2 25 22 - 32 mmol/L   Glucose, Bld 89 70 - 99 mg/dL    Comment: Glucose reference range applies only to samples taken after fasting for at least 8 hours.   BUN 28 (H) 6 - 20 mg/dL   Creatinine, Ser 8.65 (H) 0.61 - 1.24 mg/dL   Calcium 8.9 8.9 - 78.4 mg/dL   Phosphorus 5.8 (H) 2.5 - 4.6 mg/dL   Albumin 2.5 (L) 3.5 - 5.0 g/dL   GFR, Estimated 8 (L) >60 mL/min    Comment: (NOTE) Calculated using the CKD-EPI Creatinine Equation (2021)    Anion gap 17 (H) 5 - 15    Comment: Performed at [facility], [address] [place], [place] [address]  .  ROS: See HPI   Physical Exam: Vitals:   02/05/23 0830 02/05/23 1100  BP: 113/71 104/67  Pulse: (!) 56 (!) 55  Resp: 16 15  Temp:  98.5 F (36.9 C)  SpO2: 95% 95%     General: Alert chronically ill adult male on ER stretcher in hallway, pleasant NAD HEENT: [place], AT, nonicteric, MMM Neck: No JVD appreciated Heart: Bradycardia and regular no MRG appreciated Lungs: Currently CTA bilaterally nonlabored breathing Abdomen: NABS, soft NTND no ascites Extremities: No pedal edema Skin: Warm dry no acute rash or overt ulcers on exposed skin Neuro: Alert, O x 3, no asterixis, some lower extremity weakness chronic Dialysis Access: Right IJ [facility]  Dialysis Orders: Center: [facility] TTS 4 hours 15 minutes, EDW 67.5,  2K/2 calcium bath, heparin 2100, Hectorol 3 mics q. dialysis Parsabiv to 7.5 Mg q. dialysis no ESA, uses  TDC  Assessment/Plan Dyspnea secondary to volume overload/pulm edema improved with 2 L UF HD earlier today Hyperkalemia K6.3 improved to 4.2 after Lokelma and bicarb in the ER and dialysis follow-up trend ESRD -HD TTS next needed dialysis Thursday Hypertension/volume  -BP stable for him last 104 /67 in ER./On midodrine 10 mg 3 times daily Anemia  -Hgb 13.5 no ESA needs follow-up trend Metabolic bone disease -corrected calcium over 10 hold VDR a phosphorus 5.8 on Velphoro binder History of paroxysmal A-fib on Eliquis and also on amiodarone  History of TIA with left-sided weakness/does not ambulate well Nutrition -albumin 2.5 protein supplement  Lenny Pastel, PA-C [facility] [phone] 02/05/2023, 1:00 PM

## 2023-02-05 NOTE — Progress Notes (Signed)
Brief same day note:   Patient is a 63 year old male with history of ESRD on dialysis on TTS schedule, chronic hypoxia on 2 and half liters of oxygen at home at baseline, chronic HFrEF with EF of 35 to 40%, paroxysmal A-fib on Eliquis, chronic hypotension on midodrine who presented with complaint of shortness of breath.  No report of missing hemodialysis session.  On presentation, he appeared volume overloaded, elevated BNP.  Chest x-ray showed cardiomegaly, pulmonary edema, trace right pleural effusion.  Lab work showed potassium of 6.3, ordered Lokelma.  Nephrology consulted for dialysis.  Underwent dialysis this morning.  Patient seen and examined at bedside today.  During my evaluation he was overall comfortable, lying in bed.  On 2 L of oxygen per minute.  Denies any shortness of breath or cough.  Assessment and plan:  Dyspnea/ESRD on dialysis: Secondary to volume overload, pulm edema.  Elevated BNP.  Volume management per dialysis.  No report of missing hemodialysis sessions.  Dialysis.  Has tunneled dialysis catheter on the left chest  Hyperkalemia: Given Lokelma.  Hyperkalemia resolved  Elevated troponin: This is from demand ischemia from CKD/volume overload.  Denies any chest pain  Thrombocytopenia: Chronic, stable  Acute on chronic hypoxic respiratory failure: On 2 and half liters at home at baseline.  History he.  Required 3 L on presentation.  Continue to wean.    Paroxysmal A-fib: On Eliquis.  On normal sinus rhythm.  Mildly bradycardic on presentation so amiodarone on hold. monitor on telemetry Blood pressure currently stable.  Generalized weakness: PT consulted.  Lives with friend  Insomnia: On melatonin  If patient remains hemodynamically stable,possible plan for discharge tomorrow

## 2023-02-05 NOTE — Progress Notes (Signed)
   02/05/23 0700  Vitals  Temp 97.6 F (36.4 C)  Temp Source Oral  BP 98/61  MAP (mmHg) 70  BP Location Right Arm  BP Method Automatic  Patient Position (if appropriate) Lying  Pulse Rate Source Monitor  Resp 20  During Treatment Monitoring  Blood Flow Rate (mL/min) 199 mL/min  Arterial Pressure (mmHg) -98.38 mmHg  Venous Pressure (mmHg) 42.22 mmHg  TMP (mmHg) -1.41 mmHg  Ultrafiltration Rate (mL/min) 289 mL/min  Dialysate Flow Rate (mL/min) 300 ml/min  Dialysate Potassium Concentration 2  Dialysate Calcium Concentration 2.5  Duration of HD Treatment -hour(s) 3.5 hour(s)  Cumulative Fluid Removed (mL) per Treatment  2000.07  HD Safety Checks Performed Yes  Intra-Hemodialysis Comments Tx completed  Post Treatment  Dialyzer Clearance Lightly streaked  Liters Processed 63  Fluid Removed (mL) 2000 mL  Tolerated HD Treatment Yes  Hemodialysis Catheter Left Subclavian Double lumen Permanent (Tunneled)  Placement Date/Time: 06/20/21 0851   Placed prior to admission: No  Time Out: Correct patient;Correct site;Correct procedure  Maximum sterile barrier precautions: Hand hygiene;Cap;Mask;Sterile gown;Sterile gloves;Large sterile sheet  Site Prep: (c) Ot...  Site Condition No complications  Blue Lumen Status Flushed;Saline locked;Dead end cap in place  Red Lumen Status Flushed;Saline locked;Dead end cap in place  Dressing Type Transparent  Dressing Status Antimicrobial disc/dressing in place;Clean, Dry, Intact  Interventions New dressing  Drainage Description None  Dressing Change Due 02/11/23  Post treatment catheter status Capped and Clamped   Dialysis goal reduce due to low blood pressure. Dressing changed.

## 2023-02-05 NOTE — Evaluation (Signed)
Physical Therapy Evaluation Patient Details Name: Nathaniel White MRN: [id number] DOB: 30-Jan-1960 Today's Date: 02/05/2023  History of Present Illness  Patient is a 63 y/o male admitted 02/04/23 due to SOB.  Found to have elevated BNP and pulmonary edema with trace R pleural effusion and potassium of 6.3.  PMH positive for ESRD on dialysis on TTS schedule, chronic hypoxia on 2 and half liters of oxygen at home at baseline, chronic HFrEF with EF of 35 to 40%, paroxysmal A-fib on Eliquis, chronic hypotension on midodrine, PVD, CVA L hemiparesis.  Clinical Impression  Patient presents with limited mobility but possibly not too far from functional baseline.  Has caregivers that live with him and assist with up to wheelchair.  Seems though to need more assistance than on last admission with bed mobility and transfers.  Patient will benefit from skilled PT in the acute setting and to resume HHPT at d/c.         If plan is discharge home, recommend the following: A lot of help with walking and/or transfers;A lot of help with bathing/dressing/bathroom;Assistance with cooking/housework;Assist for transportation;Help with stairs or ramp for entrance   Can travel by private vehicle        Equipment Recommendations None recommended by PT  Recommendations for Other Services       Functional Status Assessment Patient has had a recent decline in their functional status and demonstrates the ability to make significant improvements in function in a reasonable and predictable amount of time.     Precautions / Restrictions Precautions Precautions: Fall Precaution Comments: oxygen 2L [place] at baseline      Mobility  Bed Mobility Overal bed mobility: Needs Assistance Bed Mobility: Supine to Sit, Sit to Supine     Supine to sit: Mod assist, HOB elevated Sit to supine: Max assist   General bed mobility comments: up to EOB on stretcher in ED with A for legs and trunk; to supine with lifting help for legs  and for repositioning trunk in supine    Transfers Overall transfer level: Needs assistance Equipment used: None Transfers: Bed to chair/wheelchair/BSC       Squat pivot transfers: Mod assist     General transfer comment: up at EOB on strecher squat pivot up toward HOB x 2 with 50% help    Ambulation/Gait               General Gait Details: Not ambulatory at baseline  Stairs            Wheelchair Mobility     Tilt Bed    Modified Rankin (Stroke Patients Only)       Balance Overall balance assessment: Needs assistance Sitting-balance support: Single extremity supported, Feet unsupported Sitting balance-Leahy Scale: Poor Sitting balance - Comments: assist for balance on edge of stretcher in ED with difficulty pivoting L hip to get to edge of bed                                     Pertinent Vitals/Pain Pain Assessment Pain Assessment: Faces Faces Pain Scale: Hurts little more Pain Location: generalized Pain Descriptors / Indicators: Aching, Discomfort Pain Intervention(s): Monitored during session, Repositioned    Home Living Family/patient expects to be discharged to:: Private residence Living Arrangements: Non-relatives/Friends Available Help at Discharge: Friend(s);Available 24 hours/day Type of Home: House Home Access: Stairs to enter   [employer] of Steps: 1 Alternate  Level Stairs-Number of Steps: flight Home Layout: Two level;Able to live on main level with bedroom/bathroom Home Equipment: Rollator (4 wheels);Cane - single point;BSC/3in1;Shower seat;Wheelchair - manual;Other (comment) (on home O2)      Prior Function Prior Level of Function : Needs assist             Mobility Comments: Pt reports his friends pick him up and put him in the w/c and take him wherever he needs to go ADLs Comments: assist with bathing and dressing     Extremity/Trunk Assessment   Upper Extremity Assessment Upper Extremity  Assessment: LUE deficits/detail LUE Deficits / Details: L elbow flexion contracture and finger contractures in MCP's 4-5 with 5th digit amputation and 3rd digit in extension LUE Coordination: decreased fine motor;decreased gross motor    Lower Extremity Assessment Lower Extremity Assessment: LLE deficits/detail LLE Deficits / Details: lacking about 20 degrees of knee extension, moves slowly with increased tone LLE Coordination: decreased fine motor;decreased gross motor    Cervical / Trunk Assessment Cervical / Trunk Assessment: Kyphotic  Communication   Communication Communication: No apparent difficulties  Cognition Arousal: Alert Behavior During Therapy: WFL for tasks assessed/performed Overall Cognitive Status: Within Functional Limits for tasks assessed                                          General Comments General comments (skin integrity, edema, etc.): reports wound on R heel healing so propped with towel roll to prevent pressure; VSS on 2.5L O2    Exercises     Assessment/Plan    PT Assessment Patient needs continued PT services  PT Problem List Decreased strength;Decreased range of motion;Decreased activity tolerance;Decreased balance;Decreased mobility       PT Treatment Interventions Functional mobility training;Therapeutic activities;Therapeutic exercise;Balance training;Neuromuscular re-education;Patient/family education    PT Goals (Current goals can be found in the Care Plan section)  Acute Rehab PT Goals Patient Stated Goal: To go home with caregiver friends support PT Goal Formulation: With patient Time For Goal Achievement: 02/19/23 Potential to Achieve Goals: Good    Frequency Min 1X/week     Co-evaluation               AM-PAC PT "6 Clicks" Mobility  Outcome Measure Help needed turning from your back to your side while in a flat bed without using bedrails?: A Little Help needed moving from lying on your back to sitting  on the side of a flat bed without using bedrails?: Total Help needed moving to and from a bed to a chair (including a wheelchair)?: A Lot Help needed standing up from a chair using your arms (e.g., wheelchair or bedside chair)?: Total Help needed to walk in hospital room?: Total Help needed climbing 3-5 steps with a railing? : Total 6 Click Score: 9    End of Session Equipment Utilized During Treatment: Oxygen Activity Tolerance: Patient limited by fatigue Patient left: in bed;with call bell/phone within reach   PT Visit Diagnosis: Muscle weakness (generalized) (M62.81)    Time: 1010-1026 PT Time Calculation (min) (ACUTE ONLY): 16 min   Charges:   PT Evaluation $PT Eval Moderate Complexity: 1 Mod   PT General Charges $$ ACUTE PT VISIT: 1 Visit         Sheran Lawless, PT Acute Rehabilitation Services [phone] 02/05/2023   Elray Mcgregor 02/05/2023, 12:03 PM

## 2023-02-05 NOTE — ED Notes (Signed)
Notified CCMD that PT does not have a monitor or cords at this time and is in the hallway. William from CCMD stated that he would notate this. I have also messaged Amrit MD to see if he can d/c order because I do not have the proper equipment at this time.PT is alert and eating at this time

## 2023-02-05 NOTE — ED Notes (Addendum)
BP was running soft,checked it manually per MD Amrit and it was 90/52. Medication order was acknowledged.

## 2023-02-06 DIAGNOSIS — R0600 Dyspnea, unspecified: Secondary | ICD-10-CM | POA: Diagnosis not present

## 2023-02-06 LAB — HEMOGLOBIN AND HEMATOCRIT, BLOOD
HCT: 40.9 % (ref 39.0–52.0)
Hemoglobin: 12.3 g/dL — ABNORMAL LOW (ref 13.0–17.0)

## 2023-02-06 LAB — BASIC METABOLIC PANEL
Anion gap: 17 — ABNORMAL HIGH (ref 5–15)
BUN: 48 mg/dL — ABNORMAL HIGH (ref 6–20)
CO2: 22 mmol/L (ref 22–32)
Calcium: 8.3 mg/dL — ABNORMAL LOW (ref 8.9–10.3)
Chloride: 92 mmol/L — ABNORMAL LOW (ref 98–111)
Creatinine, Ser: 9.22 mg/dL — ABNORMAL HIGH (ref 0.61–1.24)
GFR, Estimated: 6 mL/min — ABNORMAL LOW (ref >60)
Glucose, Bld: 118 mg/dL — ABNORMAL HIGH (ref 70–99)
Potassium: 4 mmol/L (ref 3.5–5.1)
Sodium: 131 mmol/L — ABNORMAL LOW (ref 135–145)

## 2023-02-06 LAB — HEPATITIS B SURFACE ANTIBODY, QUANTITATIVE: Hep B S AB Quant (Post): 3072 m[IU]/mL

## 2023-02-06 NOTE — Progress Notes (Signed)
Patient's stool was black this morning. Chinita Greenland, NP notified

## 2023-02-06 NOTE — TOC Transition Note (Signed)
Transition of Care (TOC) - Discharge Note Donn Pierini RN,BSN Transitions of Care Unit 4NP (Non Trauma)- RN Case Manager See Treatment Team for direct Phone #   Patient Details  Name: Nathaniel White MRN: [id number] Date of Birth: 1960/05/22  Transition of Care [facility] [place]) CM/SW Contact:  Darrold Span, RN Phone Number: 02/06/2023, 12:04 PM   Clinical Narrative:    Pt stable for transition home today, CM spoke with pt at bedside to confirm transition needs. Per pt his friend will transport home, pt states he does not want to go home by ambulance. Pt voiced that he has home 02 w/ [employer]- but does not know how to auto re-fill tanks- will need tank to transport home with. Voiced that his friend can assist in getting him in the car, has wheelchair at home.   Note recs for HHPT- per pt he is active with HH at this time- per chart review- HH agency is [facility]- pt confirmed and states he wants to continue services with them. Liaison contacted and confirmed pt has PT/OT/aide- MD to place orders to resume.   Bedside RN and this Clinical research associate returned to room to further discuss safe transport home, pt continues to decline EMS transport and states his friend will take him home and assist in getting him in car (stats she lifts him into wheelchair all the time). Informed pt that if friend is unable to safely get him in car on arrival then transportation will be re-addressed and he may need to go home by ambulance. Pt voiced understanding.   Call made to [employer] liaison to request transport 02 tank for pt to use to return home. Portable 02 will be delivered to room. [employer] will also send someone to re-educate pt on the auto-fill tanks at home.    Final next level of care: Home w Home Health Services Barriers to Discharge: No Barriers Identified   Patient Goals and CMS Choice Patient states their goals for this hospitalization and ongoing recovery are:: return home CMS Medicare.gov Compare Post Acute Care  list provided to:: Patient Choice offered to / list presented to : Patient      Discharge Placement                 Home w/ [facility]      Discharge Plan and Services Additional resources added to the After Visit Summary for     Discharge Planning Services: CM Consult Post Acute Care Choice: Home Health, Resumption of Svcs/PTA Provider          DME Arranged: Oxygen DME Agency: [facility], [facility] (Portable 02 for transport home) Date DME Agency Contacted: 02/06/23 Time DME Agency Contacted: 1100 Representative spoke with at DME Agency: Mitch HH Arranged: PT, OT, Nurse's Aide HH Agency: [facility] Date [facility] Agency Contacted: 02/06/23 Time HH Agency Contacted: [phone] Representative spoke with at [facility] Agency: Angie  Social Drivers of Health (SDOH) Interventions SDOH Screenings   Food Insecurity: No Food Insecurity (02/05/2023)  Housing: Low Risk  (02/05/2023)  Transportation Needs: No Transportation Needs (02/05/2023)  Utilities: Not At Risk (02/05/2023)  Alcohol Screen: Low Risk  (07/18/2022)  Depression (PHQ2-9): Low Risk  (07/18/2022)  Financial Resource Strain: Low Risk  (07/18/2022)  Physical Activity: Inactive (07/18/2022)  Social Connections: Moderately Isolated (07/18/2022)  Stress: No Stress Concern Present (07/18/2022)  Tobacco Use: Medium Risk (02/04/2023)     Readmission Risk Interventions    02/06/2023   12:04 PM 09/15/2021   10:09 AM 09/06/2021  4:20 PM  Readmission Risk Prevention Plan  Transportation Screening Complete Complete Complete  PCP or Specialist Appt within 3-5 Days  Complete Complete  HRI or Home Care Consult  Complete Complete  Social Work Consult for Recovery Care Planning/Counseling  Complete Complete  Palliative Care Screening  Not Applicable Not Applicable  Medication Review Oceanographer) Complete Complete Complete  HRI or Home Care Consult Complete    SW Recovery Care/Counseling Consult Complete    Palliative Care  Screening Not Applicable    Skilled Nursing Facility Not Applicable

## 2023-02-06 NOTE — Progress Notes (Signed)
Pt discharge education and instructions completed with pt and friend Cala Bradford at bedside. Both voices understanding and denies any questions. Pt IV and telemetry removed. Pt discharged home with Cala Bradford to transport him home. Pt home oxygen delivered to pt at bedside. Pt to be transported off unit via wheelchair by Cala Bradford with his belongings to the side including oxygen. Dionne Bucy RN

## 2023-02-06 NOTE — Progress Notes (Signed)
[facility] Patient Discharge Orders- [facility] CLINIC: [name]  Patient's name: NORVEL WENKER Admit/DC Dates: 02/04/2023 - 02/06/2023  Discharge Diagnoses: Dyspnea/ESRD on dialysis: Secondary to volume overload, pulm edema.    Hyperkalemia Acute on chronic hypoxic respiratory failure: On 2 and half liters at home at baseline   Aranesp: Given: no  hgb 12.3  PRBC's Given: no Date/# of units: 0 ESA dose for discharge: mircera 0 mcg IV q 2 weeks  IV Iron dose at discharge: 0  Heparin change:  EDW Change:no New EDW:  no wts post hd  Bath Change: no  Access intervention/Change: no Details:  Hectorol/Calcitriol change: no  Discharge Labs: Calcium8.3  Phosphorus 5.8 Albumin 2.5 K+ 4.0  IV Antibiotics: no Details:  On Coumadin?: no Last INR: Next INR: Managed By:   OTHER/APPTS/LAB ORDERS:    D/C Meds to be reconciled by nurse after every discharge.  Completed By:   Reviewed by: MD:______ RN_______

## 2023-02-06 NOTE — Discharge Summary (Signed)
Physician Discharge Summary  PERETZ THIEME MWU:[id number] DOB: Nov 02, 1960 DOA: 02/04/2023  PCP: Claiborne Rigg, NP  Admit date: 02/04/2023 Discharge date: 02/06/2023  Admitted From: Home Disposition:  Home  Discharge Condition:Stable CODE STATUS:FULL Diet recommendation: renal  Brief/Interim Summary: Patient is a 63 year old male with history of ESRD on dialysis on TTS schedule, chronic hypoxia on 2 and half liters of oxygen at home at baseline, chronic HFrEF with EF of 35 to 40%, paroxysmal A-fib on Eliquis, chronic hypotension on midodrine who presented with complaint of shortness of breath.  No report of missing hemodialysis session.  On presentation, he appeared volume overloaded, elevated BNP.  Chest x-ray showed cardiomegaly, pulmonary edema, trace right pleural effusion.  Lab work showed potassium of 6.3, ordered Lokelma.  Nephrology consulted for dialysis.  Underwent dialysis on 1/28.  Currently denies any dyspnea.  On baseline oxygen requirement.  Nephrology cleared for discharge.  Medically stable for discharge to home today.  Following problems were addressed during the hospitalization:  Dyspnea/ESRD on dialysis: Secondary to volume overload, pulm edema.  Elevated BNP.   No report of missing hemodialysis sessions.  Has tunneled dialysis catheter on the left chest.  Dialysis done here.  Not dyspneic now.  On baseline oxygen requirement.   Hyperkalemia: Given Lokelma.  Hyperkalemia resolved   Elevated troponin: This is from demand ischemia from CKD/volume overload.  Denies any chest pain   Thrombocytopenia: Chronic, stable   Acute on chronic hypoxic respiratory failure: On 2 and half liters at home at baseline.  History of COPD.  Currently on the same  Paroxysmal A-fib: On Eliquis.  On normal sinus rhythm.  Mildly bradycardic on presentation so amiodarone on hold Blood pressure currently stable.  Deconditioning/debility: Nonambulatory.  Ambulates with the help of wheelchair.   Lives with friends   Discharge Diagnoses:  Principal Problem:   Dyspnea    Discharge Instructions  Discharge Instructions     Diet - low sodium heart healthy   Complete by: As directed    Renal diet   Discharge instructions   Complete by: As directed    1)Please take your medications as instructed 2)Follow up with your PCP and nephrologist 3)Continue outpatient dialysis   Discharge wound care:   Complete by: As directed    As pr wound care nurse   Increase activity slowly   Complete by: As directed       Allergies as of 02/06/2023       Reactions   Codeine Shortness Of Breath   Dextromethorphan-guaifenesin Shortness Of Breath, Other (See Comments)   Iron Dextran Shortness Of Breath, Other (See Comments)   Pt does not recall 02/05/23   Morphine And Codeine Shortness Of Breath   Other Reaction(s): Other (See Comments)        Medication List     STOP taking these medications    amiodarone 200 MG tablet Commonly known as: PACERONE       TAKE these medications    acetaminophen 500 MG tablet Commonly known as: TYLENOL Take 1 tablet (500 mg total) by mouth every 6 (six) hours as needed. What changed: reasons to take this   albuterol 108 (90 Base) MCG/ACT inhaler Commonly known as: ProAir HFA Inhale 2 puffs into the lungs every 6 (six) hours as needed for wheezing or shortness of breath.   Eliquis 5 MG Tabs tablet Generic drug: apixaban Take 5 mg by mouth 2 (two) times daily.   levocetirizine 5 MG tablet Commonly known as: XYZAL Take 5  mg by mouth daily as needed for allergies.   midodrine 5 MG tablet Commonly known as: PROAMATINE Take 5 mg by mouth 3 (three) times daily. Take one tablet by mouth three times daily on non-dialysis days. Monday, Wednesday, Friday, and Sunday. What changed: Another medication with the same name was removed. Continue taking this medication, and follow the directions you see here.   Tenapanor HCl (CKD) 30 MG Tabs Take 1  tablet by mouth daily.   Velphoro 500 MG chewable tablet Generic drug: sucroferric oxyhydroxide Chew 1,000 mg by mouth 2 (two) times daily after a meal.   Visine 0.05 % ophthalmic solution Generic drug: tetrahydrozoline Place 2-3 drops into both eyes 2 (two) times daily as needed (dry eye).               Discharge Care Instructions  (From admission, onward)           Start     Ordered   02/06/23 0000  Discharge wound care:       Comments: As pr wound care nurse   02/06/23 1016            Follow-up Information     Claiborne Rigg, NP. Schedule an appointment as soon as possible for a visit in 1 week(s).   Specialty: Nurse Practitioner Contact information: [address] [place] [address] [phone]                Allergies  Allergen Reactions   Codeine Shortness Of Breath   Dextromethorphan-Guaifenesin Shortness Of Breath and Other (See Comments)   Iron Dextran Shortness Of Breath and Other (See Comments)    Pt does not recall 02/05/23   Morphine And Codeine Shortness Of Breath    Other Reaction(s): Other (See Comments)    Consultations: Nephrology   Procedures/Studies: DG Chest 2 View Result Date: 02/04/2023 CLINICAL DATA:  Shortness of breath EXAM: CHEST - 2 VIEW COMPARISON:  Chest x-ray 01/16/2023.  Chest CT 12/27/2022. FINDINGS: Left-sided central venous catheter tip projects over the cavoatrial junction. The heart is enlarged. There central pulmonary vascular congestion and central interstitial opacities bilaterally. Linear opacities in the right lower lobe appear similar to the prior study. There is a trace right pleural effusion. There is no pneumothorax. No acute fractures are seen. IMPRESSION: 1. Cardiomegaly with central pulmonary vascular congestion and central interstitial opacities bilaterally, likely pulmonary edema. 2. Trace right pleural effusion. 3. Linear opacities in the right lower lobe appear similar to the  prior study, likely atelectasis. Electronically Signed   By: Darliss Cheney M.D.   On: 02/04/2023 22:47   DG CHEST PORT 1 VIEW Result Date: 01/16/2023 CLINICAL DATA:  Pneumonia. Dialysis patient with difficulty breathing. EXAM: PORTABLE CHEST 1 VIEW COMPARISON:  Chest CT dated 01/15/2023. FINDINGS: Dialysis catheter in similar position. Slight interval improvement in bilateral interstitial and airspace opacities compared to prior radiograph. No pneumothorax. Stable mild cardiomegaly. Atherosclerotic calcification of the aorta. No acute osseous pathology. IMPRESSION: Slight interval improvement in bilateral interstitial and airspace opacities. Electronically Signed   By: Elgie Collard M.D.   On: 01/16/2023 09:34   DG Chest Port 1 View Result Date: 01/15/2023 CLINICAL DATA:  Dialysis patient with progressively worsening difficulty breathing. EXAM: PORTABLE CHEST 1 VIEW COMPARISON:  Portable chest and CTA chest both 12/27/2022 FINDINGS: Left IJ dialysis catheter again noted with the tip in the upper right atrium. Stable cardiomegaly. Mild central vascular congestion. Small pleural effusions. Similar findings were noted previously but  today there is increased interstitial and patchy airspace disease in the left mid and both lower lung fields which could be due to pneumonia or airspace edema. There is chronic right lateral basal pleuroparenchymal disease and scarring. Tortuous, calcified aorta with stable mediastinum. There is osteopenia and mild thoracic spondylosis. No new osseous findings. IMPRESSION: 1. Increased interstitial and patchy airspace disease in the left mid and both lower lung fields which could be due to pneumonia or airspace edema. 2. Stable cardiomegaly with mild central vascular congestion and small pleural effusions. 3. Aortic atherosclerosis. Electronically Signed   By: Almira Bar M.D.   On: 01/15/2023 03:45      Subjective: Patient seen and examined at bedside today.   Hemodynamically stable.  On 2 to 3 L of oxygen per minute.  Denies any shortness of breath.  Eager to go home.  As per the report from RN earlier this morning, he had 1 episode of black stool.  Patient states this is pretty usual for him.  No evidence of hematochezia or melena.  He says that it could be from the phosphate binders.  Hemoglobin stable  Discharge Exam: Vitals:   02/05/23 2304 02/06/23 0304  BP: (!) 100/56 (!) 101/57  Pulse: (!) 53 (!) 59  Resp: 19 15  Temp: 97.6 F (36.4 C) 98 F (36.7 C)  SpO2: 92% 92%   Vitals:   02/05/23 1900 02/05/23 1912 02/05/23 2304 02/06/23 0304  BP: (!) 105/59 (!) 105/58 (!) 100/56 (!) 101/57  Pulse: (!) 58 (!) 57 (!) 53 (!) 59  Resp: 18 19 19 15   Temp: 98.9 F (37.2 C) 98.5 F (36.9 C) 97.6 F (36.4 C) 98 F (36.7 C)  TempSrc: Oral Oral Oral Oral  SpO2: 95% 93% 92% 92%  Weight:        General: Pt is alert, awake, not in acute distress, chronically deconditioned Cardiovascular: RRR, S1/S2 +, no rubs, no gallops, dialysis catheter on the left chest Respiratory: CTA bilaterally, no wheezing, no rhonchi Abdominal: Soft, NT, ND, bowel sounds + Extremities: no edema, no cyanosis    The results of significant diagnostics from this hospitalization (including imaging, microbiology, ancillary and laboratory) are listed below for reference.     Microbiology: Recent Results (from the past 240 hours)  Resp panel by RT-PCR (RSV, Flu A&B, Covid) Anterior Nasal Swab     Status: None   Collection Time: 02/05/23 12:48 AM   Specimen: Anterior Nasal Swab  Result Value Ref Range Status   SARS Coronavirus 2 by RT PCR NEGATIVE NEGATIVE Final   Influenza A by PCR NEGATIVE NEGATIVE Final   Influenza B by PCR NEGATIVE NEGATIVE Final    Comment: (NOTE) The Xpert Xpress SARS-CoV-2/FLU/RSV plus assay is intended as an aid in the diagnosis of influenza from Nasopharyngeal swab specimens and should not be used as a sole basis for treatment. Nasal washings  and aspirates are unacceptable for Xpert Xpress SARS-CoV-2/FLU/RSV testing.  Fact Sheet for Patients: BloggerCourse.com  Fact Sheet for Healthcare Providers: SeriousBroker.it  This test is not yet approved or cleared by the [place] FDA and has been authorized for detection and/or diagnosis of SARS-CoV-2 by FDA under an Emergency Use Authorization (EUA). This EUA will remain in effect (meaning this test can be used) for the duration of the COVID-19 declaration under Section 564(b)(1) of the Act, 21 U.S.C. section 360bbb-3(b)(1), unless the authorization is terminated or revoked.     Resp Syncytial Virus by PCR NEGATIVE NEGATIVE Final  Comment: (NOTE) Fact Sheet for Patients: BloggerCourse.com  Fact Sheet for Healthcare Providers: SeriousBroker.it  This test is not yet approved or cleared by the [place] FDA and has been authorized for detection and/or diagnosis of SARS-CoV-2 by FDA under an Emergency Use Authorization (EUA). This EUA will remain in effect (meaning this test can be used) for the duration of the COVID-19 declaration under Section 564(b)(1) of the Act, 21 U.S.C. section 360bbb-3(b)(1), unless the authorization is terminated or revoked.  Performed at [facility], [address], [place], [place] [address]      Labs: BNP (last 3 results) Recent Labs    12/27/22 1443 01/15/23 0325 02/04/23 2353  BNP 2,332.5* 3,540.1* 3,087.6*   Basic Metabolic Panel: Recent Labs  Lab 02/04/23 2353 02/05/23 0954 02/06/23 0550  NA 136 133* 131*  K 6.3* 4.2 4.0  CL 95* 91* 92*  CO2 19* 25 22  GLUCOSE 72 89 118*  BUN 67* 28* 48*  CREATININE 10.97* 6.98* 9.22*  CALCIUM 8.8* 8.9 8.3*  PHOS  --  5.8*  --    Liver Function Tests: Recent Labs  Lab 02/05/23 0954  ALBUMIN 2.5*   No results for input(s): "LIPASE", "AMYLASE" in the last 168  hours. No results for input(s): "AMMONIA" in the last 168 hours. CBC: Recent Labs  Lab 02/04/23 2240 02/05/23 0954 02/06/23 0819  WBC 7.8 5.6  --   HGB 13.0 13.5 12.3*  HCT 43.6 44.8 40.9  MCV 83.8 82.4  --   PLT 111* 108*  --    Cardiac Enzymes: No results for input(s): "CKTOTAL", "CKMB", "CKMBINDEX", "TROPONINI" in the last 168 hours. BNP: Invalid input(s): "POCBNP" CBG: No results for input(s): "GLUCAP" in the last 168 hours. D-Dimer No results for input(s): "DDIMER" in the last 72 hours. Hgb A1c No results for input(s): "HGBA1C" in the last 72 hours. Lipid Profile No results for input(s): "CHOL", "HDL", "LDLCALC", "TRIG", "CHOLHDL", "LDLDIRECT" in the last 72 hours. Thyroid function studies No results for input(s): "TSH", "T4TOTAL", "T3FREE", "THYROIDAB" in the last 72 hours.  Invalid input(s): "FREET3" Anemia work up No results for input(s): "VITAMINB12", "FOLATE", "FERRITIN", "TIBC", "IRON", "RETICCTPCT" in the last 72 hours. Urinalysis    Component Value Date/Time   COLORURINE YELLOW 02/13/2010 0703   APPEARANCEUR CLOUDY (A) 02/13/2010 0703   LABSPEC 1.013 02/13/2010 0703   PHURINE 8.5 (H) 02/13/2010 0703   GLUCOSEU NEGATIVE 12/18/2009 0546   HGBUR LARGE (A) 02/13/2010 0703   BILIRUBINUR NEGATIVE 02/13/2010 0703   KETONESUR NEGATIVE 02/13/2010 0703   PROTEINUR >300 (A) 02/13/2010 0703   UROBILINOGEN 0.2 02/13/2010 0703   NITRITE NEGATIVE 02/13/2010 0703   LEUKOCYTESUR NEGATIVE 02/13/2010 0703   Sepsis Labs Recent Labs  Lab 02/04/23 2240 02/05/23 0954  WBC 7.8 5.6   Microbiology Recent Results (from the past 240 hours)  Resp panel by RT-PCR (RSV, Flu A&B, Covid) Anterior Nasal Swab     Status: None   Collection Time: 02/05/23 12:48 AM   Specimen: Anterior Nasal Swab  Result Value Ref Range Status   SARS Coronavirus 2 by RT PCR NEGATIVE NEGATIVE Final   Influenza A by PCR NEGATIVE NEGATIVE Final   Influenza B by PCR NEGATIVE NEGATIVE Final     Comment: (NOTE) The Xpert Xpress SARS-CoV-2/FLU/RSV plus assay is intended as an aid in the diagnosis of influenza from Nasopharyngeal swab specimens and should not be used as a sole basis for treatment. Nasal washings and aspirates are unacceptable for Xpert Xpress SARS-CoV-2/FLU/RSV testing.  Fact Sheet for Patients: BloggerCourse.com  Fact Sheet for Healthcare Providers: SeriousBroker.it  This test is not yet approved or cleared by the [place] FDA and has been authorized for detection and/or diagnosis of SARS-CoV-2 by FDA under an Emergency Use Authorization (EUA). This EUA will remain in effect (meaning this test can be used) for the duration of the COVID-19 declaration under Section 564(b)(1) of the Act, 21 U.S.C. section 360bbb-3(b)(1), unless the authorization is terminated or revoked.     Resp Syncytial Virus by PCR NEGATIVE NEGATIVE Final    Comment: (NOTE) Fact Sheet for Patients: BloggerCourse.com  Fact Sheet for Healthcare Providers: SeriousBroker.it  This test is not yet approved or cleared by the [place] FDA and has been authorized for detection and/or diagnosis of SARS-CoV-2 by FDA under an Emergency Use Authorization (EUA). This EUA will remain in effect (meaning this test can be used) for the duration of the COVID-19 declaration under Section 564(b)(1) of the Act, 21 U.S.C. section 360bbb-3(b)(1), unless the authorization is terminated or revoked.  Performed at [facility], [address] [place], [place] [address]     Please note: You were cared for by a hospitalist during your hospital stay. Once you are discharged, your primary care physician will handle any further medical issues. Please note that NO REFILLS for any discharge medications will be authorized once you are discharged, as it is imperative that you return to your primary care  physician (or establish a relationship with a primary care physician if you do not have one) for your post hospital discharge needs so that they can reassess your need for medications and monitor your lab values.    Time coordinating discharge: 40 minutes  SIGNED:   Burnadette Pop, MD  [facility] 02/06/2023, 10:16 AM Pager [phone]  If 7PM-7AM, please contact night-coverage [email or web address] Password [email or web address]

## 2023-02-06 NOTE — Progress Notes (Signed)
[facility] Patient Discharge Orders- [facility] CLINIC: [name]  Patient's name: Nathaniel White Admit/DC Dates: 02/04/2023 - 02/06/2023  Discharge Diagnoses: Dyspnea/ESRD on dialysis: Secondary to volume overload, pulm edema.    Hyperkalemia Acute on chronic hypoxic respiratory failure: On 2 and half liters at home at baseline   Aranesp: Given: no  hgb 12.3  PRBC's Given: no Date/# of units: 0 ESA dose for discharge: mircera 0 mcg IV q 2 weeks  IV Iron dose at discharge: 0  Heparin change:  EDW Change:no New EDW:  no wts post hd  Bath Change: no  Access intervention/Change: no Details:  Hectorol/Calcitriol change: no  Discharge Labs: Calcium8.3  Phosphorus 5.8 Albumin 2.5 K+ 4.0  IV Antibiotics: no Details:  On Coumadin?: no Last INR: Next INR: Managed By:   OTHER/APPTS/LAB ORDERS:    D/C Meds to be reconciled by nurse after every discharge.  Completed By:   Reviewed by: MD:______ RN_______

## 2023-02-06 NOTE — Consult Note (Signed)
WOC Nurse Consult Note: Reason for Consult: pressure injury Wound type: Unstageable right heel Pressure Injury POA: Yes Measurement: 2.5cm x 2.5cm x 0cm  Wound ZOX:WRUEAV dry eschar  Drainage (amount, consistency, odor) none Periwound: intact  Dressing procedure/placement/frequency: Paint right heel with betadine daily,  Offload with Prevalon boot  Discussed POC with patient and bedside nurse.  Re consult if needed, will not follow at this time. Thanks  Jerin Franzel [employer], RN,CWOCN, CNS, CWON-AP [phone])

## 2023-02-06 NOTE — Progress Notes (Signed)
Subjective:  No cos , no sob or cp , noted for dc today , next HD as scheduled tomor. OP unit will notify them   Objective Vital signs in last 24 hours: Vitals:   02/05/23 2304 02/06/23 0304 02/06/23 1135 02/06/23 1200  BP: (!) 100/56 (!) 101/57 96/75 110/61  Pulse: (!) 53 (!) 59 (!) 50 (!) 50  Resp: 19 15 15 15   Temp: 97.6 F (36.4 C) 98 F (36.7 C) 98.1 F (36.7 C) 98.1 F (36.7 C)  TempSrc: Oral Oral Oral Oral  SpO2: 92% 92% 95% 94%  Weight:       Weight change:   Physical Exam: General: Alert chronically ill/ pleasant NAD Heart: Bradycardia  in 50s and regular no MRG appreciated Lungs: Currently CTA bilaterally nonlabored breathing [place]  O2 ("have at home') Abdomen: NABS, soft NTND no ascites Extremities: No pedal edema Dialysis Access: Right IJ [facility]   Dialysis Orders: Center: [facility] TTS 4 hours 15 minutes, EDW 67.5,  2K/2 calcium bath, heparin 2100, Hectorol 3 mics q. dialysis [place]iv to 7.5 Mg q. dialysis no ESA, uses TDC   AProblem/Plan: Dyspnea secondary to volume overload/pulm edema improved with 2 L UF HD  day of admit  Hyperkalemia K6.3 admit  improved to 4.0  today / On admit  rx  Lokelma and bicarb in the ER and dialysis /follow-up trend ESRD -HD TTS next needed dialysis Thursday 1/30  Hypertension/volume  -BP stable /On midodrine 10 mg 3 times daily/ appears euvolemic  this am , no wts post hd , no edw change for now Anemia  -Hgb 13.5 no ESA needs follow-up trend Metabolic bone disease -corrected calcium  9.5  phosphorus 5.8 on Velphoro binder/ VDRA   History of paroxysmal A-fib on Eliquis and also on amiodarone  History of TIA with left-sided weakness/does not ambulate well Nutrition -albumin 2.5 protein supplement  Lenny Pastel, PA-C [facility] [phone] 02/06/2023,12:28 PM  LOS: 1 day   Labs: Basic Metabolic Panel: Recent Labs  Lab 02/04/23 2353 02/05/23 0954 02/06/23 0550  NA 136 133* 131*  K 6.3* 4.2 4.0  CL 95*  91* 92*  CO2 19* 25 22  GLUCOSE 72 89 118*  BUN 67* 28* 48*  CREATININE 10.97* 6.98* 9.22*  CALCIUM 8.8* 8.9 8.3*  PHOS  --  5.8*  --    Liver Function Tests: Recent Labs  Lab 02/05/23 0954  ALBUMIN 2.5*   No results for input(s): "LIPASE", "AMYLASE" in the last 168 hours. No results for input(s): "AMMONIA" in the last 168 hours. CBC: Recent Labs  Lab 02/04/23 2240 02/05/23 0954 02/06/23 0819  WBC 7.8 5.6  --   HGB 13.0 13.5 12.3*  HCT 43.6 44.8 40.9  MCV 83.8 82.4  --   PLT 111* 108*  --    Cardiac Enzymes: No results for input(s): "CKTOTAL", "CKMB", "CKMBINDEX", "TROPONINI" in the last 168 hours. CBG: No results for input(s): "GLUCAP" in the last 168 hours.  Studies/Results: DG Chest 2 View Result Date: 02/04/2023 CLINICAL DATA:  Shortness of breath EXAM: CHEST - 2 VIEW COMPARISON:  Chest x-ray 01/16/2023.  Chest CT 12/27/2022. FINDINGS: Left-sided central venous catheter tip projects over the cavoatrial junction. The heart is enlarged. There central pulmonary vascular congestion and central interstitial opacities bilaterally. Linear opacities in the right lower lobe appear similar to the prior study. There is a trace right pleural effusion. There is no pneumothorax. No acute fractures are seen. IMPRESSION: 1. Cardiomegaly with central pulmonary  vascular congestion and central interstitial opacities bilaterally, likely pulmonary edema. 2. Trace right pleural effusion. 3. Linear opacities in the right lower lobe appear similar to the prior study, likely atelectasis. Electronically Signed   By: Darliss Cheney M.D.   On: 02/04/2023 22:47   Medications:   apixaban  5 mg Oral BID   Chlorhexidine Gluconate Cloth  6 each Topical Q0600   midodrine  5 mg Oral TID WC

## 2023-02-07 ENCOUNTER — Telehealth: Payer: Self-pay

## 2023-02-07 NOTE — Transitions of Care (Post Inpatient/ED Visit) (Signed)
   02/07/2023  Name: DONZEL ROMACK MRN: [id number] DOB: [birth year]  Today's TOC FU Call Status: Today's TOC FU Call Status:: Unsuccessful Call (1st Attempt) Unsuccessful Call (1st Attempt) Date: 02/07/23  Attempted to reach the patient regarding the most recent Inpatient/ED visit.  Follow Up Plan: Additional outreach attempts will be made to reach the patient to complete the Transitions of Care (Post Inpatient/ED visit) call.   Alyse Low, RN, BA, [facility], CRRN [facility] Coordinator, Transition of Care Ph # [phone]

## 2023-02-08 ENCOUNTER — Telehealth: Payer: Self-pay

## 2023-02-08 NOTE — Transitions of Care (Post Inpatient/ED Visit) (Signed)
02/08/2023  Name: Nathaniel White MRN: [id number] DOB: 04/05/1960  Today's TOC FU Call Status: Today's TOC FU Call Status:: Successful TOC FU Call Completed TOC FU Call Complete Date: 02/08/23 Patient's Name and Date of Birth confirmed.  Transition Care Management Follow-up Telephone Call Date of Discharge: 02/06/23 Discharge Facility: [facility]) Type of Discharge: Inpatient Admission Primary Inpatient Discharge Diagnosis:: Shortness of breath, hypoxia, Acute on Chronic Respiratory failure w/ hypoxia How have you been since you were released from the hospital?: Better Any questions or concerns?: No  Items Reviewed: Did you receive and understand the discharge instructions provided?: Yes Medications obtained,verified, and reconciled?: Yes (Medications Reviewed) Any new allergies since your discharge?: No Dietary orders reviewed?: Yes Type of Diet Ordered:: Diet is low Sodium, Heart healthy, Renal (has hemodialysis TTS) Do you have support at home?: Yes People in Home: friend(s) Name of Support/Comfort Primary Source: lives with friends  Medications Reviewed Today: Medications Reviewed Today     Reviewed by Marcos Eke, RN (Registered Nurse) on 02/08/23 at 1212  Med List Status: <None>   Medication Order Taking? Sig Documenting Provider Last Dose Status Informant  acetaminophen (TYLENOL) 500 MG tablet [id number] Yes Take 1 tablet (500 mg total) by mouth every 6 (six) hours as needed.  Patient taking differently: Take 500 mg by mouth every 6 (six) hours as needed for mild pain (pain score 1-3).   Georgetta Haber, NP Taking Active Self, Pharmacy Records  albuterol [facility] HFA) [phone] Base) MCG/ACT inhaler [id number] Yes Inhale 2 puffs into the lungs every 6 (six) hours as needed for wheezing or shortness of breath. Cathren Harsh, MD Taking Active Self, Pharmacy Records           Med Note Annye Rusk   Tue Feb 05, 2023  9:52 AM) Pt requires refill as of  02/05/2023  ELIQUIS 5 MG TABS tablet [id number] Yes Take 5 mg by mouth 2 (two) times daily. [provider] Taking Active Self, Pharmacy Records  levocetirizine (XYZAL) 5 MG tablet [id number] Yes Take 5 mg by mouth daily as needed for allergies. [provider] Taking Active Pharmacy Records, Self  midodrine (PROAMATINE) 5 MG tablet [id number] Yes Take 5 mg by mouth 3 (three) times daily. Take one tablet by mouth three times daily on non-dialysis days. Monday, Wednesday, Friday, and Sunday. [provider] Taking Active Pharmacy Records, Self  Tenapanor HCl, CKD, 30 MG TABS [id number] Yes Take 1 tablet by mouth daily. [provider] Taking Active Pharmacy Records, Self           Med Note Cheron Schaumann, [place]   Tue Feb 05, 2023  9:44 AM) Pt states he is prescribed to take two tablets. He only takes one because if he takes two it makes him nauseous.   VELPHORO 500 MG chewable tablet [id number] Yes Chew 1,000 mg by mouth 2 (two) times daily after a meal. [provider] Taking Active Self, Pharmacy Records           Med Note Kandis Cocking Alinda Dooms A   Thu Dec 27, 2022  9:59 PM)    VISINE 0.05 % ophthalmic solution [id number] Yes Place 2-3 drops into both eyes 2 (two) times daily as needed (dry eye). [provider] Taking Active Self, Pharmacy Records  Med List Note Salvatore Marvel, CPhT 03/13/21 2014): Dialysis on Tues/Thurs/Sat @ [facility]- [address] [place], [place] [address]  [phone]  Home Care and Equipment/Supplies: Were Home Health Services Ordered?: Yes Name of Home Health Agency:: [facility] Home Health agency re-engaged and providing [facility] PT/OT/aide Has Agency set up a time to come to your home?: Yes First Home Health Visit Date: 02/08/23 Any new equipment or medical supplies ordered?: Yes Name of Medical supply agency?: Home Oxygen (not new) being supplied by [facility]) Were you  able to get the equipment/medical supplies?: Yes Do you have any questions related to the use of the equipment/supplies?: No  Functional Questionnaire: Do you need assistance with bathing/showering or dressing?: Yes (Provided with HH PT/OT/aide for help with deconditioning) Do you need assistance with meal preparation?: Yes Do you need assistance with eating?: No Do you have difficulty maintaining continence: No Do you need assistance with getting out of bed/getting out of a chair/moving?: Yes Do you have difficulty managing or taking your medications?: No  Follow up appointments reviewed: PCP Follow-up appointment confirmed?: No (Patient states he will call his PCP, Bertram Denver himself for a follow up appointment - will review confirimation of PCP follow-up next week during TOC 30d week 2 engagement) MD Provider Line Number:[phone] Given: No Specialist Hospital Follow-up appointment confirmed?: NA Do you need transportation to your follow-up appointment?: No Do you understand care options if your condition(s) worsen?: Yes-patient verbalized understanding    Alyse Low, RN, BA, [facility], CRRN [facility] Population Health Care Management Coordinator, Transition of Care Ph # [phone]

## 2023-02-15 ENCOUNTER — Telehealth: Payer: Self-pay

## 2023-02-15 ENCOUNTER — Other Ambulatory Visit: Payer: Medicare Other

## 2023-02-15 NOTE — Patient Outreach (Signed)
  Care Management  Transitions of Care Program Managed Medicaid Transitions of Care week 2  02/15/2023 Name: [name] MRN: [id number] DOB: 1960-02-06  Subjective: Nathaniel White Nathaniel a 63 y.o. year old male who Nathaniel a primary care patient of Theotis Haze ORN, NP. The Care Management team was unable to reach the patient by phone to assess and address transitions of care needs.   Plan: Additional outreach attempts will be made to reach the patient enrolled in the [facility] Program (Post Inpatient/ED Visit).  Channing Larry, RN, BA, [facility], CRRN [facility] Coordinator, Transition of Care Ph # [phone]

## 2023-02-15 NOTE — Telephone Encounter (Signed)
 Copied from CRM [id number]. Topic: Clinical - Home Health Verbal Orders >> Feb 15, 2023  3:10 PM Elle L wrote: Caller/Agency: Liji Physical Therapist with [facility] Callback Number: [phone] Service Requested: Skilled Nursing Frequency: Evaluation Any new concerns about the patient? Yes, dry skin and wounds on legs. She declined him needing triaged.

## 2023-02-15 NOTE — Telephone Encounter (Signed)
 Call to patient to schedule appointment . Unable to reach message left.

## 2023-02-15 NOTE — Telephone Encounter (Signed)
 Spoke with Liji Physical Therapist with [facility]   Skilled Nursing  Frequency: Evaluation  Any new concerns about the patient? Yes, dry skin and wounds on legs. She declined him needing triaged.   Advised patient he needs to come in for a visit. Has not bee seen in all most one year.

## 2023-02-18 ENCOUNTER — Ambulatory Visit: Payer: Self-pay | Admitting: Nurse Practitioner

## 2023-02-18 ENCOUNTER — Telehealth: Payer: Self-pay

## 2023-02-18 NOTE — Telephone Encounter (Signed)
Return call to patient unanswered.

## 2023-02-18 NOTE — Telephone Encounter (Signed)
 If you are unable to access it please go under previous orders and reprint

## 2023-02-18 NOTE — Telephone Encounter (Signed)
 2nd attempt, left voicemail for patient to return call for triage. Per patient's chart office staff have reached out on 02/15/23 to attempt to schedule and appointment.  Summary: med issue    Copied From CRM [id number]. Reason for Triage: pt states he needs to speak w/ his dr.  He states the physical therapist has "blocked' him from getting his meds, and he really needs to speak w/ his dr.  I could only get this information from him, and could not quite understand what he is referring to.

## 2023-02-18 NOTE — Telephone Encounter (Signed)
 The order is already in the system from January

## 2023-02-18 NOTE — Telephone Encounter (Signed)
 1st attempt to call for triage, left voicemail for patient to return call. Per patient's chart office staff have reached out on 02/15/23 to attempt to schedule an appointment.   Summary: med issue   Copied From CRM [id number]. Reason for Triage: pt states he needs to speak w/ his dr.  He states the physical therapist has "blocked' him from getting his meds, and he really needs to speak w/ his dr.  I could only get this information from him, and could not quite understand what he is referring to.

## 2023-02-18 NOTE — Telephone Encounter (Signed)
 Copied from CRM [id number]. Topic: Clinical - Prescription Issue >> Feb 15, 2023  3:12 PM Elle L wrote: Reason for CRM: Caller/Agency: Liji, Physical Therapist, with [facility] was following up on her request for a prescription for a side board to help the patient when transferring in sitting positions and to gain some of his independence back. Her call back number is [phone].

## 2023-02-18 NOTE — Telephone Encounter (Signed)
 3rd attempt, left voicemail for patient to return call. Closing CRM and routing to office.  Summary: med issue    Copied From CRM [id number]. Reason for Triage: pt states he needs to speak w/ his dr.  He states the physical therapist has "blocked' him from getting his meds, and he really needs to speak w/ his dr.  I could only get this information from him, and could not quite understand what he is referring to.

## 2023-02-19 NOTE — Telephone Encounter (Signed)
Has to be virtual or in office

## 2023-02-19 NOTE — Telephone Encounter (Signed)
Nathaniel White from [employer] stated that notes were needed for order.

## 2023-02-19 NOTE — Telephone Encounter (Signed)
Yes

## 2023-02-20 NOTE — Telephone Encounter (Signed)
Unable to reach patient by phone to relay response. Voicemail left to return call.

## 2023-02-25 ENCOUNTER — Telehealth: Payer: Self-pay

## 2023-02-25 ENCOUNTER — Inpatient Hospital Stay (HOSPITAL_COMMUNITY)
Admission: EM | Admit: 2023-02-25 | Discharge: 2023-04-09 | DRG: 640 | Disposition: E | Payer: Medicare Other | Attending: Internal Medicine | Admitting: Internal Medicine

## 2023-02-25 ENCOUNTER — Emergency Department (HOSPITAL_COMMUNITY): Payer: Medicare Other

## 2023-02-25 ENCOUNTER — Other Ambulatory Visit: Payer: Self-pay

## 2023-02-25 DIAGNOSIS — I2489 Other forms of acute ischemic heart disease: Secondary | ICD-10-CM | POA: Diagnosis present

## 2023-02-25 DIAGNOSIS — E871 Hypo-osmolality and hyponatremia: Secondary | ICD-10-CM | POA: Diagnosis not present

## 2023-02-25 DIAGNOSIS — J440 Chronic obstructive pulmonary disease with acute lower respiratory infection: Secondary | ICD-10-CM | POA: Diagnosis not present

## 2023-02-25 DIAGNOSIS — F419 Anxiety disorder, unspecified: Secondary | ICD-10-CM | POA: Diagnosis present

## 2023-02-25 DIAGNOSIS — R64 Cachexia: Secondary | ICD-10-CM | POA: Diagnosis present

## 2023-02-25 DIAGNOSIS — I480 Paroxysmal atrial fibrillation: Secondary | ICD-10-CM | POA: Diagnosis present

## 2023-02-25 DIAGNOSIS — R197 Diarrhea, unspecified: Secondary | ICD-10-CM | POA: Diagnosis not present

## 2023-02-25 DIAGNOSIS — R1110 Vomiting, unspecified: Secondary | ICD-10-CM | POA: Diagnosis not present

## 2023-02-25 DIAGNOSIS — I5084 End stage heart failure: Secondary | ICD-10-CM | POA: Diagnosis present

## 2023-02-25 DIAGNOSIS — I69354 Hemiplegia and hemiparesis following cerebral infarction affecting left non-dominant side: Secondary | ICD-10-CM

## 2023-02-25 DIAGNOSIS — Z9911 Dependence on respirator [ventilator] status: Secondary | ICD-10-CM

## 2023-02-25 DIAGNOSIS — I132 Hypertensive heart and chronic kidney disease with heart failure and with stage 5 chronic kidney disease, or end stage renal disease: Secondary | ICD-10-CM | POA: Diagnosis present

## 2023-02-25 DIAGNOSIS — I469 Cardiac arrest, cause unspecified: Secondary | ICD-10-CM

## 2023-02-25 DIAGNOSIS — E872 Acidosis, unspecified: Secondary | ICD-10-CM | POA: Diagnosis present

## 2023-02-25 DIAGNOSIS — Z992 Dependence on renal dialysis: Secondary | ICD-10-CM

## 2023-02-25 DIAGNOSIS — J9621 Acute and chronic respiratory failure with hypoxia: Secondary | ICD-10-CM | POA: Diagnosis present

## 2023-02-25 DIAGNOSIS — J9601 Acute respiratory failure with hypoxia: Principal | ICD-10-CM

## 2023-02-25 DIAGNOSIS — E785 Hyperlipidemia, unspecified: Secondary | ICD-10-CM | POA: Diagnosis present

## 2023-02-25 DIAGNOSIS — N186 End stage renal disease: Secondary | ICD-10-CM | POA: Diagnosis present

## 2023-02-25 DIAGNOSIS — R6521 Severe sepsis with septic shock: Secondary | ICD-10-CM | POA: Diagnosis not present

## 2023-02-25 DIAGNOSIS — D631 Anemia in chronic kidney disease: Secondary | ICD-10-CM | POA: Diagnosis present

## 2023-02-25 DIAGNOSIS — E1151 Type 2 diabetes mellitus with diabetic peripheral angiopathy without gangrene: Secondary | ICD-10-CM | POA: Diagnosis present

## 2023-02-25 DIAGNOSIS — Z515 Encounter for palliative care: Secondary | ICD-10-CM

## 2023-02-25 DIAGNOSIS — T8241XA Breakdown (mechanical) of vascular dialysis catheter, initial encounter: Secondary | ICD-10-CM | POA: Diagnosis present

## 2023-02-25 DIAGNOSIS — R579 Shock, unspecified: Secondary | ICD-10-CM

## 2023-02-25 DIAGNOSIS — Z993 Dependence on wheelchair: Secondary | ICD-10-CM

## 2023-02-25 DIAGNOSIS — Q613 Polycystic kidney, unspecified: Secondary | ICD-10-CM

## 2023-02-25 DIAGNOSIS — Z8249 Family history of ischemic heart disease and other diseases of the circulatory system: Secondary | ICD-10-CM

## 2023-02-25 DIAGNOSIS — Y95 Nosocomial condition: Secondary | ICD-10-CM | POA: Diagnosis not present

## 2023-02-25 DIAGNOSIS — I5082 Biventricular heart failure: Secondary | ICD-10-CM | POA: Diagnosis present

## 2023-02-25 DIAGNOSIS — Z888 Allergy status to other drugs, medicaments and biological substances status: Secondary | ICD-10-CM

## 2023-02-25 DIAGNOSIS — R54 Age-related physical debility: Secondary | ICD-10-CM | POA: Diagnosis present

## 2023-02-25 DIAGNOSIS — Z89022 Acquired absence of left finger(s): Secondary | ICD-10-CM

## 2023-02-25 DIAGNOSIS — N2581 Secondary hyperparathyroidism of renal origin: Secondary | ICD-10-CM | POA: Diagnosis present

## 2023-02-25 DIAGNOSIS — Z885 Allergy status to narcotic agent status: Secondary | ICD-10-CM

## 2023-02-25 DIAGNOSIS — K5900 Constipation, unspecified: Secondary | ICD-10-CM | POA: Diagnosis present

## 2023-02-25 DIAGNOSIS — F1190 Opioid use, unspecified, uncomplicated: Secondary | ICD-10-CM | POA: Diagnosis present

## 2023-02-25 DIAGNOSIS — J690 Pneumonitis due to inhalation of food and vomit: Secondary | ICD-10-CM | POA: Diagnosis not present

## 2023-02-25 DIAGNOSIS — E8770 Fluid overload, unspecified: Secondary | ICD-10-CM | POA: Diagnosis not present

## 2023-02-25 DIAGNOSIS — Z7901 Long term (current) use of anticoagulants: Secondary | ICD-10-CM

## 2023-02-25 DIAGNOSIS — Z89422 Acquired absence of other left toe(s): Secondary | ICD-10-CM

## 2023-02-25 DIAGNOSIS — A419 Sepsis, unspecified organism: Secondary | ICD-10-CM

## 2023-02-25 DIAGNOSIS — E876 Hypokalemia: Secondary | ICD-10-CM | POA: Diagnosis not present

## 2023-02-25 DIAGNOSIS — I9589 Other hypotension: Secondary | ICD-10-CM | POA: Diagnosis present

## 2023-02-25 DIAGNOSIS — Z87891 Personal history of nicotine dependence: Secondary | ICD-10-CM

## 2023-02-25 DIAGNOSIS — Y712 Prosthetic and other implants, materials and accessory cardiovascular devices associated with adverse incidents: Secondary | ICD-10-CM | POA: Diagnosis present

## 2023-02-25 DIAGNOSIS — I428 Other cardiomyopathies: Secondary | ICD-10-CM | POA: Diagnosis present

## 2023-02-25 DIAGNOSIS — Z833 Family history of diabetes mellitus: Secondary | ICD-10-CM

## 2023-02-25 DIAGNOSIS — E875 Hyperkalemia: Secondary | ICD-10-CM | POA: Diagnosis present

## 2023-02-25 DIAGNOSIS — Z66 Do not resuscitate: Secondary | ICD-10-CM | POA: Diagnosis present

## 2023-02-25 DIAGNOSIS — R570 Cardiogenic shock: Secondary | ICD-10-CM | POA: Diagnosis not present

## 2023-02-25 DIAGNOSIS — I472 Ventricular tachycardia, unspecified: Secondary | ICD-10-CM | POA: Diagnosis not present

## 2023-02-25 DIAGNOSIS — I462 Cardiac arrest due to underlying cardiac condition: Secondary | ICD-10-CM | POA: Diagnosis not present

## 2023-02-25 DIAGNOSIS — E1136 Type 2 diabetes mellitus with diabetic cataract: Secondary | ICD-10-CM | POA: Diagnosis present

## 2023-02-25 DIAGNOSIS — I340 Nonrheumatic mitral (valve) insufficiency: Secondary | ICD-10-CM | POA: Diagnosis present

## 2023-02-25 DIAGNOSIS — E43 Unspecified severe protein-calorie malnutrition: Secondary | ICD-10-CM | POA: Diagnosis present

## 2023-02-25 DIAGNOSIS — I2510 Atherosclerotic heart disease of native coronary artery without angina pectoris: Secondary | ICD-10-CM | POA: Diagnosis present

## 2023-02-25 DIAGNOSIS — I4901 Ventricular fibrillation: Secondary | ICD-10-CM | POA: Diagnosis not present

## 2023-02-25 DIAGNOSIS — E1165 Type 2 diabetes mellitus with hyperglycemia: Secondary | ICD-10-CM | POA: Diagnosis present

## 2023-02-25 DIAGNOSIS — E1122 Type 2 diabetes mellitus with diabetic chronic kidney disease: Secondary | ICD-10-CM | POA: Diagnosis present

## 2023-02-25 DIAGNOSIS — Z9981 Dependence on supplemental oxygen: Secondary | ICD-10-CM

## 2023-02-25 DIAGNOSIS — Z79899 Other long term (current) drug therapy: Secondary | ICD-10-CM

## 2023-02-25 DIAGNOSIS — I509 Heart failure, unspecified: Secondary | ICD-10-CM

## 2023-02-25 DIAGNOSIS — Z6822 Body mass index (BMI) 22.0-22.9, adult: Secondary | ICD-10-CM

## 2023-02-25 DIAGNOSIS — I493 Ventricular premature depolarization: Secondary | ICD-10-CM | POA: Diagnosis present

## 2023-02-25 DIAGNOSIS — D6959 Other secondary thrombocytopenia: Secondary | ICD-10-CM | POA: Diagnosis not present

## 2023-02-25 DIAGNOSIS — I5023 Acute on chronic systolic (congestive) heart failure: Secondary | ICD-10-CM | POA: Diagnosis present

## 2023-02-25 DIAGNOSIS — J189 Pneumonia, unspecified organism: Secondary | ICD-10-CM | POA: Diagnosis not present

## 2023-02-25 LAB — I-STAT VENOUS BLOOD GAS, ED
Acid-base deficit: 1 mmol/L (ref 0.0–2.0)
Bicarbonate: 24.8 mmol/L (ref 20.0–28.0)
Calcium, Ion: 0.84 mmol/L — CL (ref 1.15–1.40)
HCT: 38 % — ABNORMAL LOW (ref 39.0–52.0)
Hemoglobin: 12.9 g/dL — ABNORMAL LOW (ref 13.0–17.0)
O2 Saturation: 81 %
Potassium: >8.5 mmol/L (ref 3.5–5.1)
Sodium: 129 mmol/L — ABNORMAL LOW (ref 135–145)
TCO2: 26 mmol/L (ref 22–32)
pCO2, Ven: 43.1 mm[Hg] — ABNORMAL LOW (ref 44–60)
pH, Ven: 7.368 (ref 7.25–7.43)
pO2, Ven: 47 mm[Hg] — ABNORMAL HIGH (ref 32–45)

## 2023-02-25 LAB — CBC WITH DIFFERENTIAL/PLATELET
Abs Immature Granulocytes: 0.03 10*3/uL (ref 0.00–0.07)
Basophils Absolute: 0.1 10*3/uL (ref 0.0–0.1)
Basophils Relative: 2 %
Eosinophils Absolute: 0.4 10*3/uL (ref 0.0–0.5)
Eosinophils Relative: 5 %
HCT: 39 % (ref 39.0–52.0)
Hemoglobin: 11.7 g/dL — ABNORMAL LOW (ref 13.0–17.0)
Immature Granulocytes: 0 %
Lymphocytes Relative: 16 %
Lymphs Abs: 1.1 10*3/uL (ref 0.7–4.0)
MCH: 24.9 pg — ABNORMAL LOW (ref 26.0–34.0)
MCHC: 30 g/dL (ref 30.0–36.0)
MCV: 83.2 fL (ref 80.0–100.0)
Monocytes Absolute: 1 10*3/uL (ref 0.1–1.0)
Monocytes Relative: 15 %
Neutro Abs: 4.3 10*3/uL (ref 1.7–7.7)
Neutrophils Relative %: 62 %
Platelets: 128 10*3/uL — ABNORMAL LOW (ref 150–400)
RBC: 4.69 MIL/uL (ref 4.22–5.81)
RDW: 18.6 % — ABNORMAL HIGH (ref 11.5–15.5)
WBC: 6.9 10*3/uL (ref 4.0–10.5)
nRBC: 0 % (ref 0.0–0.2)

## 2023-02-25 LAB — COMPREHENSIVE METABOLIC PANEL
ALT: 9 U/L (ref 0–44)
AST: 38 U/L (ref 15–41)
Albumin: <1.5 g/dL — ABNORMAL LOW (ref 3.5–5.0)
Alkaline Phosphatase: 38 U/L (ref 38–126)
Anion gap: 10 (ref 5–15)
BUN: 47 mg/dL — ABNORMAL HIGH (ref 6–20)
CO2: 15 mmol/L — ABNORMAL LOW (ref 22–32)
Calcium: 5.7 mg/dL — CL (ref 8.9–10.3)
Chloride: 117 mmol/L — ABNORMAL HIGH (ref 98–111)
Creatinine, Ser: 8.29 mg/dL — ABNORMAL HIGH (ref 0.61–1.24)
GFR, Estimated: 7 mL/min — ABNORMAL LOW (ref >60)
Glucose, Bld: 77 mg/dL (ref 70–99)
Potassium: 4.8 mmol/L (ref 3.5–5.1)
Sodium: 142 mmol/L (ref 135–145)
Total Bilirubin: 1 mg/dL (ref 0.0–1.2)
Total Protein: 4.5 g/dL — ABNORMAL LOW (ref 6.5–8.1)

## 2023-02-25 LAB — BRAIN NATRIURETIC PEPTIDE: B Natriuretic Peptide: 2467.6 pg/mL — ABNORMAL HIGH (ref 0.0–100.0)

## 2023-02-25 LAB — TROPONIN I (HIGH SENSITIVITY): Troponin I (High Sensitivity): 56 ng/L — ABNORMAL HIGH (ref <18)

## 2023-02-25 MED ORDER — CALCIUM GLUCONATE-NACL 1-0.675 GM/50ML-% IV SOLN
2.0000 g | Freq: Once | INTRAVENOUS | Status: AC
  Administered 2023-02-26: 1000 mg via INTRAVENOUS
  Filled 2023-02-25: qty 100

## 2023-02-25 NOTE — Patient Instructions (Signed)
 Visit Information  Hello Mr. Marland Kitchen,  Thank you for taking time to visit with me today. Please don't hesitate to contact me if I can be of assistance to you before our next scheduled telephone appointment.  Our next appointment is by telephone on 03/04/23 at 1pm  Following is a copy of your care plan:   Goals Addressed             This Visit's Progress    Transition of Care       Current Barriers:  Knowledge Deficits related to plan of care for management of Pulmonary Disease   RNCM Clinical Goal(s):  Patient will work with the Care Management team over the next 30 days to address Transition of Care Barriers: Medication access Medication Management Diet/Nutrition/Food Resources Support at home Provider appointments Home Health services Equipment/DME Functional/Safety Transportation through collaboration with Medical illustrator, provider, and care team.   Interventions: Evaluation of current treatment plan related to  self management and patient's adherence to plan as established by provider    Chronic Kidney Disease Interventions:  (Status:  Ongoing goal) Short Term Goal Assessed the Patient understanding of chronic kidney disease    Evaluation of current treatment plan related to chronic kidney disease self management and patient's adherence to plan as established by provider      Reviewed prescribed diet Low Sodium, Heart Healthy, Renal Diet Reviewed medications with patient and discussed importance of compliance    Advised patient, providing education and rationale, to monitor blood pressure daily and record, calling PCP for findings outside established parameters    Discussed plans with patient for ongoing care management follow up and provided patient with direct contact information for care management team    Screening for signs and symptoms of depression related to chronic disease state      Discussed the impact of chronic kidney disease on daily life and mental  health and acknowledged and normalized feelings of disempowerment, fear, and frustration    Assessed social determinant of health barriers    Last practice recorded BP readings:  BP Readings from Last 3 Encounters:  02/06/23 110/61  01/19/23 123/68  01/01/23 116/84   Most recent eGFR/CrCl:  Lab Results  Component Value Date   EGFR 6 (L) 06/07/2021    No components found for: "CRCL"    COPD Interventions:  (Status:  New goal.) Short Term Goal Provided patient with basic written and verbal COPD education on self care/management/and exacerbation prevention Provided written and verbal instructions on pursed lip breathing and utilized returned demonstration as teach back Advised patient to self assesses COPD action plan zone and make appointment with provider if in the yellow zone for 48 hours without improvement Provided education about and advised patient to utilize infection prevention strategies to reduce risk of respiratory infection Discussed the importance of adequate rest and management of fatigue with COPD Provided Oxygen Usage education as patient requires O2 @ 2.5L/min continuously via nasal cannula for chronic hypoxia  Patient Goals/Self-Care Activities: Participate in Transition of Care Program/Attend TOC scheduled calls Take all medications as prescribed Attend all scheduled provider appointments Call pharmacy for medication refills 3-7 days in advance of running out of medications Perform all self care activities independently  Call provider office for new concerns or questions   Follow Up Plan:  The patient has been provided with contact information for the care management team and has been advised to call with any health related questions or concerns.  Next telephone appointment with  RN Case manager is 2/24 @ 1pm         The patient verbalized understanding of instructions, educational materials, and care plan provided today and DECLINED offer to receive copy of  patient instructions, educational materials, and care plan.   The patient has been provided with contact information for the care management team and has been advised to call with any health related questions or concerns.   Please call the care guide team at [phone] if you need to cancel or reschedule your appointment.   Please call 1-800-273-TALK (toll free, 24 hour hotline) if you are experiencing a Mental Health or Behavioral Health Crisis or need someone to talk to.  Alyse Low, RN, BA, [facility], CRRN [facility] Coordinator, Transition of Care Ph # [phone]

## 2023-02-25 NOTE — Telephone Encounter (Signed)
 Copied from CRM [id number]. Topic: Clinical - Home Health Verbal Orders >> Feb 25, 2023  3:40 PM Phill Myron wrote: Caller/Agency: Ashok Cordia with [facility]  Callback Number: [phone] Service Requested: Occupational Therapy Frequency: 1w 4   Upper extremity strengthing, DMEs, Transfers/  Any new concerns about the patient? Yes Drop handle commode chair. And sliding board  Weight 165lbs, height 5'9" Due to CVA   [employer] Fax# [phone]

## 2023-02-25 NOTE — Consult Note (Signed)
 Nathaniel White Admit Date: 02/25/2023 02/25/2023 Nathaniel White Requesting Physician:  Suezanne Jacquet MD  Reason for Consult:  ESRD Hyperkalemia AHRF HPI:  77M ESRD THS at [facility] brought into the ED earlier today with complaints of dyspnea.  Last HD 2/14 which was abbreviated because of catheter flow problems where Cathflo was left in the [facility] and he was sent back home.  He left 1.5 kg above EDW.  When EMS was called he was satting 85% on room air.  In the ED placed on BiPAP with improvement in oxygenation.  Blood pressures are fairly normal.  Afebrile.  VBG came back with potassium of 8.5 but CMP with potassium of 4.8.  Interestingly calcium is 5.7 with an albumin of less than 1.5.  EKG with no peaked T waves.  PR interval is around 226.   Patient is stable on BiPAP with normal saturations.  Portable chest x-ray with vascular congestion and probable mild interstitial edema with likely small left pleural effusion.  PMH as below  ROS balance of 12 systems is negative w/ exceptions as above  PMH  Past Medical History:  Diagnosis Date   Anemia    Anxiety    Asthma    Complication from renal dialysis device 11/02/2013   Complication of anesthesia 2017   according to pt and spouse pt was moving around and cough while under and pt had difficulty waking up so the anesthesia had to be reversed. and pt admitted to ICU.    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/2008   Qualifier: Diagnosis of  By: Russella Dar MD Marylu Lund    ESRD    on HD, T-TH-Sat - [place]   Hemiparesis due to old stroke [facility])    left   HFrEF (heart failure with reduced ejection fraction) (HCC)    NICM // TTE 06/2022: EF 25-30, sever RV dysfunction, mod to severe MR   Hypertension    Hx, not current  problems, no meds   LV dysfunction    EF 25-30% by echo 07/2011   Memory loss due to medical condition    due to stroke   MR (mitral regurgitation)    moderate to severe, echo 07/2011   Nausea with vomiting, unspecified 06/15/2021   Paroxysmal atrial fibrillation (HCC)    Peripheral vascular disease (HCC)    Shortness of breath    with exertion   Stroke (HCC)    TIA's-left sided weakness   Tobacco abuse    PSH  Past Surgical History:  Procedure Laterality Date   ABDOMINAL AORTOGRAM W/LOWER EXTREMITY Bilateral 07/21/2018   Procedure: ABDOMINAL AORTOGRAM W/LOWER EXTREMITY;  Surgeon: Maeola Harman, MD;  Location: [facility] INVASIVE CV LAB;  Service: Cardiovascular;  Laterality: Bilateral;   AMPUTATION Left 11/05/2018   Procedure: AMPUTATION SECOND TOE LEFT FOOT;  Surgeon: Maeola Harman, MD;  Location: [facility] OR;  Service: Vascular;  Laterality: Left;   AMPUTATION Left 02/13/2021   Procedure: Left small finger AMPUTATION DIGIT;  Surgeon: Marlyne Beards, MD;  Location: [facility] OR;  Service: Orthopedics;  Laterality: Left;   AV FISTULA PLACEMENT     CARDIAC CATHETERIZATION     [facility]   COLONOSCOPY W/ BIOPSIES AND POLYPECTOMY     FISTULA SUPERFICIALIZATION Left 11/10/2013  Procedure: FISTULA PLICATION;  Surgeon: Nada Libman, MD;  Location: [facility] OR;  Service: Vascular;  Laterality: Left;   INSERTION OF DIALYSIS CATHETER N/A 06/20/2021   Procedure: INSERTION OF DIALYSIS CATHETER;  Surgeon: Victorino Sparrow, MD;  Location: [facility] OR;  Service: Vascular;  Laterality: N/A;   KIDNEY TRANSPLANT     2011 rejected kidney 2012 back on dialysis   LEFT AND RIGHT HEART CATHETERIZATION WITH CORONARY ANGIOGRAM N/A 10/09/2013   Procedure: LEFT AND RIGHT HEART CATHETERIZATION WITH CORONARY ANGIOGRAM;  Surgeon: Kathleene Hazel, MD;  Location: [facility] CATH LAB;  Service: Cardiovascular;  Laterality: N/A;   LIGATION OF ARTERIOVENOUS  FISTULA Left 06/20/2021   Procedure: LIGATION OF  ARTERIOVENOUS  FISTULA;  Surgeon: Victorino Sparrow, MD;  Location: [facility] OR;  Service: Vascular;  Laterality: Left;   PERIPHERAL VASCULAR INTERVENTION Left 07/21/2018   Procedure: PERIPHERAL VASCULAR INTERVENTION;  Surgeon: Maeola Harman, MD;  Location: [facility] INVASIVE CV LAB;  Service: Cardiovascular;  Laterality: Left;  Popliteal   REVISON OF ARTERIOVENOUS FISTULA Left 08/26/2013   Procedure: EXCISION OF ERODED SKIN AND EXPLORATION OF MAIN LEFT UPPER ARM AV FISTULA;  Surgeon: Larina Earthly, MD;  Location: [facility] OR;  Service: Vascular;  Laterality: Left;   REVISON OF ARTERIOVENOUS FISTULA Left 02/05/2014   Procedure: REPAIR OF ARTERIOVENOUS FISTULA ANEURYSM;  Surgeon: Nada Libman, MD;  Location: [facility] OR;  Service: Vascular;  Laterality: Left;   REVISON OF ARTERIOVENOUS FISTULA Left 03/10/2021   Procedure: LEFT ARM REVISION OF ARTERIOVENOUS FISTULA WITH PLICATION;  Surgeon: Maeola Harman, MD;  Location: [facility] OR;  Service: Vascular;  Laterality: Left;   RIGHT/LEFT HEART CATH AND CORONARY ANGIOGRAPHY N/A 06/21/2022   Procedure: RIGHT/LEFT HEART CATH AND CORONARY ANGIOGRAPHY;  Surgeon: Lennette Bihari, MD;  Location: [facility] INVASIVE CV LAB;  Service: Cardiovascular;  Laterality: N/A;   SCROTAL EXPLORATION N/A 09/16/2022   Procedure: PENILE DEBRIDMENT;  Surgeon: Rene Paci, MD;  Location: [facility] OR;  Service: Urology;  Laterality: N/A;   SHUNTOGRAM N/A 11/05/2012   Procedure: Fistulogram;  Surgeon: Nada Libman, MD;  Location: [facility] CATH LAB;  Service: Cardiovascular;  Laterality: N/A;   TEE WITHOUT CARDIOVERSION  08/17/2011   Procedure: TRANSESOPHAGEAL ECHOCARDIOGRAM (TEE);  Surgeon: Laurey Morale, MD;  Location: [facility]OSCOPY;  Service: Cardiovascular;  Laterality: N/A;   ULTRASOUND GUIDANCE FOR VASCULAR ACCESS  06/20/2021   Procedure: ULTRASOUND GUIDANCE FOR VASCULAR ACCESS;  Surgeon: Victorino Sparrow, MD;  Location: [facility] OR;  Service: Vascular;;   VIDEO ASSISTED THORACOSCOPY  (VATS)/DECORTICATION  08/10/11   FH  Family History  Problem Relation Age of Onset   Hypertension Mother    Varicose Veins Mother    Diabetes Paternal Grandmother    Cancer Paternal Grandfather    CAD Paternal Uncle    SH  reports that he quit smoking about 11 years ago. His smoking use included cigarettes. He started smoking about 26 years ago. He has a 7.5 pack-year smoking history. He has never used smokeless tobacco. He reports that he does not drink alcohol and does not use drugs. Allergies  Allergies  Allergen Reactions   Codeine Shortness Of Breath   Dextromethorphan-Guaifenesin Shortness Of Breath and Other (See Comments)   Iron Dextran Shortness Of Breath and Other (See Comments)    Pt does not recall 02/05/23   Morphine And Codeine Shortness Of Breath    Other Reaction(s): Other (See Comments)   Home medications Prior to Admission medications   Medication Sig Start Date  End Date Taking? Authorizing Provider  acetaminophen (TYLENOL) 500 MG tablet Take 1 tablet (500 mg total) by mouth every 6 (six) hours as needed. Patient taking differently: Take 500 mg by mouth every 6 (six) hours as needed for mild pain (pain score 1-3). 04/11/18   Georgetta Haber, NP  albuterol (PROAIR HFA) 108 (90 Base) MCG/ACT inhaler Inhale 2 puffs into the lungs every 6 (six) hours as needed for wheezing or shortness of breath. 11/30/22   Rai, Ripudeep K, MD  ELIQUIS 5 MG TABS tablet Take 5 mg by mouth 2 (two) times daily. 12/09/21   [provider]  levocetirizine (XYZAL) 5 MG tablet Take 5 mg by mouth daily as needed for allergies.    [provider]  midodrine (PROAMATINE) 5 MG tablet Take 5 mg by mouth 3 (three) times daily. Take one tablet by mouth three times daily on non-dialysis days. Monday, Wednesday, Friday, and Sunday. 01/21/23   [provider]  Tenapanor HCl, CKD, 30 MG TABS Take 1 tablet by mouth daily.    [provider]  VELPHORO 500 MG chewable tablet  Chew 1,000 mg by mouth 2 (two) times daily after a meal. 02/06/21   [provider]  VISINE 0.05 % ophthalmic solution Place 2-3 drops into both eyes 2 (two) times daily as needed (dry eye). 07/04/21   [provider]    Current Medications Scheduled Meds: Continuous Infusions: PRN Meds:.  CBC Recent Labs  Lab 02/25/23 2133 02/25/23 2226  WBC 6.9  --   NEUTROABS 4.3  --   HGB 11.7* 12.9*  HCT 39.0 38.0*  MCV 83.2  --   PLT 128*  --    Basic Metabolic Panel Recent Labs  Lab 02/25/23 2133 02/25/23 2226  NA 142 129*  K 4.8 >8.5*  CL 117*  --   CO2 15*  --   GLUCOSE 77  --   BUN 47*  --   CREATININE 8.29*  --   CALCIUM 5.7*  --     Physical Exam  Blood pressure (!) 145/76, pulse 63, temperature (!) 97 F (36.1 C), temperature source Axillary, resp. rate 18, SpO2 96%. GEN: On BiPAP, comfortable appearing, no distress ENT: NCAT EYES: EOMI CV: Regular, normal S1 and S2 PULM: Clear bilaterally ABD: Soft, nontender SKIN: No rashes or lesions EXT: No edema Left IJ TDC intact  Assessment 42M ESRD presenting with AHRF requiring BiPAP, hypervolemia, hyperkalemia.  Thankfully the hyperkalemia is likely spurious as CMP is reassuring.  ESRD THS via TDC at [facility] AHRF on BiPAP with volume overload/pulmonary edema Spurious hyperkalemia, repeat CMP reassuring. Hypocalcemia; outpt Ca last mo was 9s Anemia, hemoglobins are stable  Plan HD overnight or first thing in AM based upon critical needs, thankfully K was better [facility] has Cathflo, taped on the end of the line; order notes this Can give 2g Ca GLuc for the low Ca but not sure this is accurate Trend labs RP and CBC if not already collected    Nathaniel White  [phone] pgr 02/25/2023, 11:47 PM

## 2023-02-25 NOTE — ED Notes (Signed)
Patient signed consent form for hemodialysis .

## 2023-02-25 NOTE — Telephone Encounter (Signed)
 HH requesting Drop handle commode chair. And sliding board   Patient Weight 165lbs and height 5'9" Due to CVA .   Routing to CMA

## 2023-02-25 NOTE — Patient Outreach (Signed)
 Care Management  Transitions of Care Program Transitions of Care Post-discharge week 3   02/25/2023 Name: Nathaniel White MRN: [id number] DOB: 06-09-1960  Subjective: Nathaniel White is a 63 y.o. year old male who is a primary care patient of Claiborne Rigg, NP. The Care Management team Engaged with patient Engaged with patient by telephone to assess and address transitions of care needs.   Consent to Services:  Patient was given information about Managed Medicaid Care Management services, agreed to services, and gave verbal consent to participate.   Assessment:           SDOH Interventions    Flowsheet Row ED to Hosp-Admission (Discharged) from 01/15/2023 in [facility] 58M KIDNEY UNIT Clinical Support from 07/18/2022 in [facility] [place]  SDOH Interventions    Food Insecurity Interventions -- Intervention Not Indicated  Housing Interventions -- Intervention Not Indicated  Transportation Interventions Intervention Not Indicated, Inpatient TOC, Patient Resources (Friends/Family) Intervention Not Indicated  Utilities Interventions -- Intervention Not Indicated  Alcohol Usage Interventions -- Intervention Not Indicated (Score <7)  Financial Strain Interventions -- Intervention Not Indicated  Physical Activity Interventions -- Intervention Not Indicated  Stress Interventions -- Intervention Not Indicated  Social Connections Interventions -- Intervention Not Indicated        Goals Addressed             This Visit's Progress    Transition of Care       Current Barriers:  Knowledge Deficits related to plan of care for management of Pulmonary Disease   RNCM Clinical Goal(s):  Patient will work with the Care Management team over the next 30 days to address Transition of Care Barriers: Medication access Medication Management Diet/Nutrition/Food Resources Support at home Provider appointments Home Health  services Equipment/DME Functional/Safety Transportation through collaboration with Medical illustrator, provider, and care team.   Interventions: Evaluation of current treatment plan related to  self management and patient's adherence to plan as established by provider    Chronic Kidney Disease Interventions:  (Status:  Ongoing goal) Short Term Goal Assessed the Patient understanding of chronic kidney disease    Evaluation of current treatment plan related to chronic kidney disease self management and patient's adherence to plan as established by provider      Reviewed prescribed diet Low Sodium, Heart Healthy, Renal Diet Reviewed medications with patient and discussed importance of compliance    Advised patient, providing education and rationale, to monitor blood pressure daily and record, calling PCP for findings outside established parameters    Discussed plans with patient for ongoing care management follow up and provided patient with direct contact information for care management team    Screening for signs and symptoms of depression related to chronic disease state      Discussed the impact of chronic kidney disease on daily life and mental health and acknowledged and normalized feelings of disempowerment, fear, and frustration    Assessed social determinant of health barriers    Last practice recorded BP readings:  BP Readings from Last 3 Encounters:  02/06/23 110/61  01/19/23 123/68  01/01/23 116/84   Most recent eGFR/CrCl:  Lab Results  Component Value Date   EGFR 6 (L) 06/07/2021    No components found for: "CRCL"    COPD Interventions:  (Status:  New goal.) Short Term Goal Provided patient with basic written and verbal COPD education on self care/management/and exacerbation prevention Provided written  and verbal instructions on pursed lip breathing and utilized returned demonstration as teach back Advised patient to self assesses COPD action plan zone and make appointment  with provider if in the yellow zone for 48 hours without improvement Provided education about and advised patient to utilize infection prevention strategies to reduce risk of respiratory infection Discussed the importance of adequate rest and management of fatigue with COPD Provided Oxygen Usage education as patient requires O2 @ 2.5L/min continuously via nasal cannula for chronic hypoxia  Patient Goals/Self-Care Activities: Participate in Transition of Care Program/Attend TOC scheduled calls Take all medications as prescribed Attend all scheduled provider appointments Call pharmacy for medication refills 3-7 days in advance of running out of medications Perform all self care activities independently  Call provider office for new concerns or questions   Follow Up Plan:  The patient has been provided with contact information for the care management team and has been advised to call with any health related questions or concerns.  Next telephone appointment with RN Case manager is 2/24 @ 1pm         Plan: The patient has been provided with contact information for the care management team and has been advised to call with any health related questions or concerns.   Alyse Low, RN, BA, [facility], CRRN [facility] Coordinator, Transition of Care Ph # [phone]

## 2023-02-25 NOTE — Telephone Encounter (Signed)
 V.O given to [facility] with [facility]  Service Requested: Occupational Therapy  Frequency: 1w 4   Upper extremity strengthing, DMEs, Transfers/

## 2023-02-25 NOTE — ED Provider Notes (Signed)
 [facility] Provider Note  CSN: [id number] Arrival date & time: 02/25/23 2051  Chief Complaint(s) Respiratory Distress  HPI Nathaniel White is a 63 y.o. male history of end-stage renal disease on Tuesday, Thursday, Saturday dialysis, COPD, diabetes, prior stroke with left-sided weakness presenting to the emergency department with shortness of breath.  Patient reports that he has been compliant with dialysis, last on Saturday.  He reports there is some kind of issue with his dialysis catheter so his dialysis session was 30 minutes short on Saturday.  Today developed progressive worsening shortness of breath, dyspnea.  Paramedics were called, found the patient was 85% on room air.  Initially gave DuoNeb which did not seem to help so he was placed on BiPAP which did help.  Reports that he feels okay while he is on BiPAP.   Past Medical History Past Medical History:  Diagnosis Date   Anemia    Anxiety    Asthma    Complication from renal dialysis device 11/02/2013   Complication of anesthesia 2017   according to pt and spouse pt was moving around and cough while under and pt had difficulty waking up so the anesthesia had to be reversed. and pt admitted to ICU.    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/2008   Qualifier: Diagnosis of  By: Russella Dar MD Marylu Lund    ESRD    on HD, T-TH-Sat - [place]   Hemiparesis due to old stroke [facility])    left   HFrEF (heart failure with reduced ejection fraction) (HCC)    NICM // TTE 06/2022: EF 25-30, sever RV dysfunction, mod to severe MR   Hypertension    Hx, not current problems, no meds   LV dysfunction    EF 25-30% by echo 07/2011   Memory loss due to medical condition    due to stroke   MR (mitral  regurgitation)    moderate to severe, echo 07/2011   Nausea with vomiting, unspecified 06/15/2021   Paroxysmal atrial fibrillation (HCC)    Peripheral vascular disease (HCC)    Shortness of breath    with exertion   Stroke (HCC)    TIA's-left sided weakness   Tobacco abuse    Patient Active Problem List   Diagnosis Date Noted   Fluid overload 02/26/2023   Dyspnea 02/05/2023   Acute exacerbation of CHF (congestive heart failure) (HCC) 01/15/2023   CAP (community acquired pneumonia) 01/15/2023   Hyperkalemia 01/15/2023   Chronic ITP (idiopathic thrombocytopenia) (HCC) 01/15/2023   Acute hypoxic respiratory failure (HCC) 01/15/2023   Respiratory failure (HCC) 01/15/2023   Hypokalemia 12/28/2022   Prolonged QT interval 12/28/2022   Chronic hypoxic respiratory failure (HCC) 12/28/2022   Chronic systolic CHF (congestive heart failure) (HCC) 12/28/2022   Elevated troponin 12/28/2022   Acute bronchitis due to Rhinovirus 12/28/2022   First degree heart block 09/17/2022   Puncture wound of left knee with complication 09/15/2022   Open wound of penis with complication 09/15/2022   Sepsis secondary to penile infection 09/15/2022   Diabetes mellitus without complication (HCC) 09/15/2022   Encounter for screening for respiratory tuberculosis 08/20/2022   Mitral regurgitation 07/03/2022   Ventricular tachycardia, unspecified (HCC) 06/20/2022   Palpitations 06/19/2022   Volume  overload 09/14/2021   COPD with acute exacerbation (HCC)    Pulmonary embolus, right (HCC) 09/07/2021   Severe pulmonary hypertension (HCC) 09/07/2021   End-stage renal disease on hemodialysis (HCC) 09/07/2021   Diabetes mellitus type 2, controlled, without complications (HCC) 09/07/2021   Anemia of chronic disease 09/07/2021   SOB (shortness of breath) 09/04/2021   History of pulmonary embolism 09/04/2021   Pulmonary embolism (HCC) 09/04/2021   NSVT (nonsustained ventricular tachycardia) (HCC) 06/26/2021    Hypoglycemia 06/24/2021   Cirrhosis (HCC) 06/23/2021   Confusion 06/22/2021   Necrotizing pneumonia (HCC) 06/21/2021   CVA (cerebral vascular accident) (HCC) 06/21/2021   Paroxysmal atrial fibrillation (HCC) 06/21/2021   Chronic hypotension 06/21/2021   Unusual Appearance with Innumerable Lesions on CT scan, needs MRI 06/21/2021   Aortic atherosclerosis (HCC) 06/21/2021   ESRD (end stage renal disease) (HCC) 06/20/2021   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 (HCC) 06/20/2021   Nausea with vomiting, unspecified 06/15/2021   Acute on chronic hypoxic respiratory failure (HCC) 03/10/2021   Shock circulatory (HCC) 03/10/2021   Hemodialysis-associated hypotension 03/10/2021   Finger infection    Paronychia of finger, left 02/03/2021   Left hand pain 12/29/2020   Allergy, unspecified, initial encounter 09/28/2019   Anaphylactic shock, unspecified, initial encounter 09/28/2019   Pressure ulcer of ankle 09/01/2018   Thrombocytopenia (HCC) 01/17/2015   Transfusion history 01/17/2015   Hypercalcemia 11/01/2014   Dependence on renal dialysis (HCC) 03/26/2014   Encounter for fitting and adjustment of extracorporeal dialysis catheter (HCC) 03/26/2014   Diarrhea, unspecified 01/03/2014   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/10/2013   Pain, unspecified 09/23/2013   Cough 09/07/2013   HFrEF (heart failure with reduced ejection fraction) (HCC) 08/14/2013   Coagulation defect, unspecified (HCC) 12/15/2012   Moderate protein-calorie malnutrition (HCC) 08/07/2012   Restrictive lung disease 07/06/2012   History of CVA (cerebrovascular accident) 08/13/2011   ESRD on dialysis (HCC) 03/13/2011   HTN (hypertension) 03/13/2011   H/O 03/13/2011   Anemia 03/13/2011   Fluid overload, unspecified 03/13/2011   History of nonadherence to medical treatment 03/13/2011   Non-insulin dependent type 2 diabetes mellitus (HCC) 03/13/2011   Tobacco abuse 03/13/2011   Kidney  transplant status 11/28/2008   HEMATOCHEZIA 10/04/2008   Iron deficiency anemia, unspecified 03/31/2003   Patient's noncompliance with other medical treatment and regimen 03/31/2003   Personal history of nicotine dependence 03/31/2003   Secondary hyperparathyroidism of renal origin (HCC) 03/31/2003   Home Medication(s) Prior to Admission medications   Medication Sig Start Date End Date Taking? Authorizing Provider  acetaminophen (TYLENOL) 500 MG tablet Take 1 tablet (500 mg total) by mouth every 6 (six) hours as needed. Patient taking differently: Take 500 mg by mouth every 6 (six) hours as needed for mild pain (pain score 1-3). 04/11/18  Yes Burky, Barron Alvine, NP  albuterol (PROAIR HFA) 108 (90 Base) MCG/ACT inhaler Inhale 2 puffs into the lungs every 6 (six) hours as needed for wheezing or shortness of breath. 11/30/22  Yes Rai, Ripudeep K, MD  ELIQUIS 5 MG TABS tablet Take 5 mg by mouth 2 (two) times daily. 12/09/21  Yes [provider]  levocetirizine (XYZAL) 5 MG tablet Take 5 mg by mouth daily as needed for allergies.   Yes [provider]  midodrine (PROAMATINE) 5 MG tablet Take 5 mg by mouth 3 (three) times daily. Take one tablet by mouth three times daily on non-dialysis days. Monday, Wednesday, Friday, and Sunday. 01/21/23  Yes [provider]  [facility]  500 MG chewable tablet Chew 1,000 mg by mouth 2 (two) times daily after a meal. 02/06/21  Yes [provider]  VISINE 0.05 % ophthalmic solution Place 2-3 drops into both eyes 2 (two) times daily as needed (dry eye). 07/04/21  Yes [provider]                                                                                                                                    Past Surgical History Past Surgical History:  Procedure Laterality Date   ABDOMINAL AORTOGRAM W/LOWER EXTREMITY Bilateral 07/21/2018   Procedure: ABDOMINAL AORTOGRAM W/LOWER EXTREMITY;  Surgeon: Maeola Harman, MD;   Location[facility] INVASIVE CV LAB;  Service: Cardiovascular;  Laterality: Bilateral;   AMPUTATION Left 11/05/2018   Procedure: AMPUTATION SECOND TOE LEFT FOOT;  Surgeon: Maeola Harman, MD;  Location: [facility] OR;  Service: Vascular;  Laterality: Left;   AMPUTATION Left 02/13/2021   Procedure: Left small finger AMPUTATION DIGIT;  Surgeon: Marlyne Beards, MD;  Location: [facility] OR;  Service: Orthopedics;  Laterality: Left;   AV FISTULA PLACEMENT     CARDIAC CATHETERIZATION     [facility]   COLONOSCOPY W/ BIOPSIES AND POLYPECTOMY     FISTULA SUPERFICIALIZATION Left 11/10/2013   Procedure: FISTULA PLICATION;  Surgeon: Nada Libman, MD;  Location: [facility] OR;  Service: Vascular;  Laterality: Left;   INSERTION OF DIALYSIS CATHETER N/A 06/20/2021   Procedure: INSERTION OF DIALYSIS CATHETER;  Surgeon: Victorino Sparrow, MD;  Location: [facility] OR;  Service: Vascular;  Laterality: N/A;   KIDNEY TRANSPLANT     2011 rejected kidney 2012 back on dialysis   LEFT AND RIGHT HEART CATHETERIZATION WITH CORONARY ANGIOGRAM N/A 10/09/2013   Procedure: LEFT AND RIGHT HEART CATHETERIZATION WITH CORONARY ANGIOGRAM;  Surgeon: Kathleene Hazel, MD;  Location: [facility] CATH LAB;  Service: Cardiovascular;  Laterality: N/A;   LIGATION OF ARTERIOVENOUS  FISTULA Left 06/20/2021   Procedure: LIGATION OF ARTERIOVENOUS  FISTULA;  Surgeon: Victorino Sparrow, MD;  Location: [facility] OR;  Service: Vascular;  Laterality: Left;   PERIPHERAL VASCULAR INTERVENTION Left 07/21/2018   Procedure: PERIPHERAL VASCULAR INTERVENTION;  Surgeon: Maeola Harman, MD;  Location: [facility] INVASIVE CV LAB;  Service: Cardiovascular;  Laterality: Left;  Popliteal   REVISON OF ARTERIOVENOUS FISTULA Left 08/26/2013   Procedure: EXCISION OF ERODED SKIN AND EXPLORATION OF MAIN LEFT UPPER ARM AV FISTULA;  Surgeon: Larina Earthly, MD;  Location: [facility] OR;  Service: Vascular;  Laterality: Left;   REVISON OF ARTERIOVENOUS FISTULA Left 02/05/2014   Procedure: REPAIR OF  ARTERIOVENOUS FISTULA ANEURYSM;  Surgeon: Nada Libman, MD;  Location: [facility] OR;  Service: Vascular;  Laterality: Left;   REVISON OF ARTERIOVENOUS FISTULA Left 03/10/2021   Procedure: LEFT ARM REVISION OF ARTERIOVENOUS FISTULA WITH PLICATION;  Surgeon: Maeola Harman, MD;  Location: [facility] OR;  Service: Vascular;  Laterality: Left;   RIGHT/LEFT HEART CATH AND CORONARY  ANGIOGRAPHY N/A 06/21/2022   Procedure: RIGHT/LEFT HEART CATH AND CORONARY ANGIOGRAPHY;  Surgeon: Lennette Bihari, MD;  Location: [facility] INVASIVE CV LAB;  Service: Cardiovascular;  Laterality: N/A;   SCROTAL EXPLORATION N/A 09/16/2022   Procedure: PENILE DEBRIDMENT;  Surgeon: Rene Paci, MD;  Location: [facility] OR;  Service: Urology;  Laterality: N/A;   SHUNTOGRAM N/A 11/05/2012   Procedure: Fistulogram;  Surgeon: Nada Libman, MD;  Location: [facility] CATH LAB;  Service: Cardiovascular;  Laterality: N/A;   TEE WITHOUT CARDIOVERSION  08/17/2011   Procedure: TRANSESOPHAGEAL ECHOCARDIOGRAM (TEE);  Surgeon: Laurey Morale, MD;  Location: [facility] ENDOSCOPY;  Service: Cardiovascular;  Laterality: N/A;   ULTRASOUND GUIDANCE FOR VASCULAR ACCESS  06/20/2021   Procedure: ULTRASOUND GUIDANCE FOR VASCULAR ACCESS;  Surgeon: Victorino Sparrow, MD;  Location: [facility] OR;  Service: Vascular;;   VIDEO ASSISTED THORACOSCOPY (VATS)/DECORTICATION  08/10/11   Family History Family History  Problem Relation Age of Onset   Hypertension Mother    Varicose Veins Mother    Diabetes Paternal Grandmother    Cancer Paternal Grandfather    CAD Paternal Uncle     Social History Social History   Tobacco Use   Smoking status: Former    Current packs/day: 0.00    Average packs/day: 0.5 packs/day for 15.0 years (7.5 ttl pk-yrs)    Types: Cigarettes    Start date: 1999    Quit date: 2014    Years since quitting: 11.1   Smokeless tobacco: Never  Vaping Use   Vaping status: Never Used  Substance Use Topics   Alcohol use: No    Alcohol/week: 0.0 standard drinks  of alcohol   Drug use: No   Allergies Codeine, Dextromethorphan-guaifenesin, Iron dextran, and Morphine and codeine  Review of Systems Review of Systems  All other systems reviewed and are negative.   Physical Exam Vital Signs  I have reviewed the triage vital signs BP (!) 150/110 (BP Location: Right Arm)   Pulse 93   Temp 97.9 F (36.6 C) (Oral)   Resp 20   Ht 5\' 9"  (1.753 m)   Wt 68 kg   SpO2 100%   BMI 22.14 kg/m  Physical Exam Vitals and nursing note reviewed.  Constitutional:      General: He is not in acute distress.    Appearance: Normal appearance.  HENT:     Mouth/Throat:     Mouth: Mucous membranes are moist.  Eyes:     Conjunctiva/sclera: Conjunctivae normal.  Cardiovascular:     Rate and Rhythm: Normal rate and regular rhythm.  Pulmonary:     Comments: On BiPAP, mild increased work of breathing, diffuse crackles Abdominal:     General: Abdomen is flat.     Palpations: Abdomen is soft.     Tenderness: There is no abdominal tenderness.  Musculoskeletal:     Right lower leg: No edema.     Left lower leg: No edema.  Skin:    General: Skin is warm and dry.     Capillary Refill: Capillary refill takes less than 2 seconds.  Neurological:     Mental Status: He is alert and oriented to person, place, and time. Mental status is at baseline.     Comments: Chronic left hemiparesis  Psychiatric:        Mood and Affect: Mood normal.        Behavior: Behavior normal.     ED Results and Treatments Labs (all labs ordered are listed, but only abnormal results are  displayed) Labs Reviewed  COMPREHENSIVE METABOLIC PANEL - Abnormal; Notable for the following components:      Result Value   Chloride 117 (*)    CO2 15 (*)    BUN 47 (*)    Creatinine, Ser 8.29 (*)    Calcium 5.7 (*)    Total Protein 4.5 (*)    Albumin <1.5 (*)    GFR, Estimated 7 (*)    All other components within normal limits  CBC WITH DIFFERENTIAL/PLATELET - Abnormal; Notable for the  following components:   Hemoglobin 11.7 (*)    MCH 24.9 (*)    RDW 18.6 (*)    Platelets 128 (*)    All other components within normal limits  BRAIN NATRIURETIC PEPTIDE - Abnormal; Notable for the following components:   B Natriuretic Peptide 2,467.6 (*)    All other components within normal limits  RENAL FUNCTION PANEL - Abnormal; Notable for the following components:   Potassium 5.8 (*)    BUN 69 (*)    Creatinine, Ser 13.51 (*)    Phosphorus 9.9 (*)    Albumin 2.3 (*)    GFR, Estimated 4 (*)    Anion gap 16 (*)    All other components within normal limits  CBC - Abnormal; Notable for the following components:   Hemoglobin 10.4 (*)    HCT 35.3 (*)    MCH 24.4 (*)    MCHC 29.5 (*)    RDW 18.1 (*)    Platelets 134 (*)    All other components within normal limits  I-STAT VENOUS BLOOD GAS, ED - Abnormal; Notable for the following components:   pCO2, Ven 43.1 (*)    pO2, Ven 47 (*)    Sodium 129 (*)    Potassium >8.5 (*)    Calcium, Ion 0.84 (*)    HCT 38.0 (*)    Hemoglobin 12.9 (*)    All other components within normal limits  TROPONIN I (HIGH SENSITIVITY) - Abnormal; Notable for the following components:   Troponin I (High Sensitivity) 56 (*)    All other components within normal limits  TROPONIN I (HIGH SENSITIVITY) - Abnormal; Notable for the following components:   Troponin I (High Sensitivity) 84 (*)    All other components within normal limits  TROPONIN I (HIGH SENSITIVITY) - Abnormal; Notable for the following components:   Troponin I (High Sensitivity) 96 (*)    All other components within normal limits  HEPATITIS B SURFACE ANTIGEN  MAGNESIUM  HEPATITIS B SURFACE ANTIBODY, QUANTITATIVE                                                                                                                          Radiology DG Chest Portable 1 View Result Date: 02/25/2023 CLINICAL DATA:  Shortness of breath EXAM: PORTABLE CHEST 1 VIEW COMPARISON:  02/04/2023,  01/16/2023, CT 12/27/2022, chest x-ray 09/04/2021, 03/03/2018 FINDINGS: Left-sided central venous catheter tip at the right atrium. Chronic pleural and parenchymal  scarring on the right. Cardiomegaly with vascular congestion and probable mild interstitial edema. Suspicion of small left effusion. Similar peripheral bandlike opacities in the left mid to lower lung which could be due to chronic scarring or atelectasis. IMPRESSION: 1. Cardiomegaly with vascular congestion and probable mild interstitial edema. Suspicion of small left effusion. 2. Chronic pleural and parenchymal scarring on the right. Similar peripheral bandlike opacities in the left mid to lower lung which could be due to chronic scarring or atelectasis. Electronically Signed   By: Jasmine Pang M.D.   On: 02/25/2023 22:06    Pertinent labs & imaging results that were available during my care of the patient were reviewed by me and considered in my medical decision making (see MDM for details).  Medications Ordered in ED Medications  midodrine (PROAMATINE) tablet 5 mg (5 mg Oral Given 02/26/23 0818)  sucroferric oxyhydroxide (VELPHORO) chewable tablet 1,000 mg (has no administration in time range)  apixaban (ELIQUIS) tablet 5 mg (5 mg Oral Given 02/26/23 1405)  ipratropium-albuterol (DUONEB) 0.5-2.5 (3) MG/3ML nebulizer solution 3 mL (has no administration in time range)  loratadine (CLARITIN) tablet 10 mg (has no administration in time range)  acetaminophen (TYLENOL) tablet 500 mg (has no administration in time range)  prochlorperazine (COMPAZINE) injection 10 mg (has no administration in time range)  heparin sodium (porcine) injection 4,200 Units (4,200 Units Intracatheter New Bag/Given 02/26/23 1148)  calcium gluconate 1 g/ 50 mL sodium chloride IVPB (0 mg Intravenous Stopped 02/26/23 0036)  midodrine (PROAMATINE) tablet 5 mg (5 mg Oral Given 02/26/23 1057)                                                                                                                                      Procedures .Critical Care  Performed by: Lonell Grandchild, MD Authorized by: Lonell Grandchild, MD   Critical care provider statement:    Critical care time (minutes):  30   Critical care was necessary to treat or prevent imminent or life-threatening deterioration of the following conditions:  Respiratory failure   Critical care was time spent personally by me on the following activities:  Development of treatment plan with patient or surrogate, discussions with consultants, evaluation of patient's response to treatment, examination of patient, ordering and review of laboratory studies, ordering and review of radiographic studies, ordering and performing treatments and interventions, pulse oximetry, re-evaluation of patient's condition and review of old charts   (including critical care time)  Medical Decision Making / ED Course   MDM:  63 year old presenting to the emergency department shortness of breath.  Patient well-appearing, in mild respiratory distress.  On BiPAP, reports improvement in symptoms.  Suspect most likely cause of symptoms is volume overload from combination of end-stage renal disease and CHF.  Does have pulmonary crackles.  Denies fevers.  Reports that he did not have a full session of dialysis during his last session.  X-ray  also shows volume overload.  Less likely COPD without wheezing.  No pneumothorax on chest x-ray.  Discussed with Dr. Marisue Humble with nephrology who will plan on dialysis.  His VBG results show hyperkalemia, EKG without change.  Could be related to hemolysis.  Will call him back once our CMP result to see whether this is a true finding or not.  If he does have hyperkalemia may need therapeutic management prior to initiation of dialysis.  Clinical Course as of 02/26/23 1631  Tue Feb 26, 2023  0024 Repeat potassium is normal, suspect initial value in error. Discussed with Dr. Marisue Humble, plan for HD in  AM. He recommends giving 2 g calcium for hypocalcemia on CMP.  [WS]  0045 Signed out to Dr. Madilyn Hook pending admission to hospital for HD [WS]    Clinical Course User Index [WS] Lonell Grandchild, MD     Additional history obtained: -Additional history obtained from ems -External records from outside source obtained and reviewed including: Chart review including previous notes, labs, imaging, consultation notes including prior notes   Lab Tests: -I ordered, reviewed, and interpreted labs.   The pertinent results include:   Labs Reviewed  COMPREHENSIVE METABOLIC PANEL - Abnormal; Notable for the following components:      Result Value   Chloride 117 (*)    CO2 15 (*)    BUN 47 (*)    Creatinine, Ser 8.29 (*)    Calcium 5.7 (*)    Total Protein 4.5 (*)    Albumin <1.5 (*)    GFR, Estimated 7 (*)    All other components within normal limits  CBC WITH DIFFERENTIAL/PLATELET - Abnormal; Notable for the following components:   Hemoglobin 11.7 (*)    MCH 24.9 (*)    RDW 18.6 (*)    Platelets 128 (*)    All other components within normal limits  BRAIN NATRIURETIC PEPTIDE - Abnormal; Notable for the following components:   B Natriuretic Peptide 2,467.6 (*)    All other components within normal limits  RENAL FUNCTION PANEL - Abnormal; Notable for the following components:   Potassium 5.8 (*)    BUN 69 (*)    Creatinine, Ser 13.51 (*)    Phosphorus 9.9 (*)    Albumin 2.3 (*)    GFR, Estimated 4 (*)    Anion gap 16 (*)    All other components within normal limits  CBC - Abnormal; Notable for the following components:   Hemoglobin 10.4 (*)    HCT 35.3 (*)    MCH 24.4 (*)    MCHC 29.5 (*)    RDW 18.1 (*)    Platelets 134 (*)    All other components within normal limits  I-STAT VENOUS BLOOD GAS, ED - Abnormal; Notable for the following components:   pCO2, Ven 43.1 (*)    pO2, Ven 47 (*)    Sodium 129 (*)    Potassium >8.5 (*)    Calcium, Ion 0.84 (*)    HCT 38.0 (*)     Hemoglobin 12.9 (*)    All other components within normal limits  TROPONIN I (HIGH SENSITIVITY) - Abnormal; Notable for the following components:   Troponin I (High Sensitivity) 56 (*)    All other components within normal limits  TROPONIN I (HIGH SENSITIVITY) - Abnormal; Notable for the following components:   Troponin I (High Sensitivity) 84 (*)    All other components within normal limits  TROPONIN I (HIGH SENSITIVITY) - Abnormal; Notable for the  following components:   Troponin I (High Sensitivity) 96 (*)    All other components within normal limits  HEPATITIS B SURFACE ANTIGEN  MAGNESIUM  HEPATITIS B SURFACE ANTIBODY, QUANTITATIVE    Notable for elevated BNP, signs of ESRD  EKG   EKG Interpretation Date/Time:  Monday February 25 2023 21:00:30 EST Ventricular Rate:  64 PR Interval:  226 QRS Duration:  151 QT Interval:  491 QTC Calculation: 507 R Axis:   160  Text Interpretation: Sinus rhythm Prolonged PR interval Nonspecific intraventricular conduction delay Abnormal lateral Q waves Probable anteroseptal infarct, old Confirmed by Eber Hong ([id number]) on 02/26/2023 10:14:59 AM         Imaging Studies ordered: I ordered imaging studies including CXR On my interpretation imaging demonstrates pulmonary edmea I independently visualized and interpreted imaging. I agree with the radiologist interpretation   Medicines ordered and prescription drug management: Meds ordered this encounter  Medications   DISCONTD: heparin injection 1,000 Units   DISCONTD: anticoagulant sodium citrate solution 5 mL   DISCONTD: alteplase (CATHFLO ACTIVASE) injection 2 mg   calcium gluconate 1 g/ 50 mL sodium chloride IVPB   calcium gluconate in NaCl 1-0.675 GM/50ML-% IVPB    Sangalang, Robert R: cabinet override   midodrine (PROAMATINE) tablet 5 mg    Take one tablet by mouth three times daily on non-dialysis days. Monday, Wednesday, Friday, and Sunday.     sucroferric oxyhydroxide  (VELPHORO) chewable tablet 1,000 mg   apixaban (ELIQUIS) tablet 5 mg   ipratropium-albuterol (DUONEB) 0.5-2.5 (3) MG/3ML nebulizer solution 3 mL   DISCONTD: pentafluoroprop-tetrafluoroeth (GEBAUERS) aerosol 1 Application   DISCONTD: lidocaine (PF) (XYLOCAINE) 1 % injection 5 mL   DISCONTD: lidocaine-prilocaine (EMLA) cream 1 Application   DISCONTD: acetaminophen (TYLENOL) tablet 650 mg   loratadine (CLARITIN) tablet 10 mg   acetaminophen (TYLENOL) tablet 500 mg   prochlorperazine (COMPAZINE) injection 10 mg   heparin sodium (porcine) injection 4,200 Units   midodrine (PROAMATINE) tablet 5 mg    -I have reviewed the patients home medicines and have made adjustments as needed   Consultations Obtained: I requested consultation with the nephrologist,  and discussed lab and imaging findings as well as pertinent plan - they recommend: admission   Cardiac Monitoring: The patient was maintained on a cardiac monitor.  I personally viewed and interpreted the cardiac monitored which showed an underlying rhythm of: NSR   Reevaluation: After the interventions noted above, I reevaluated the patient and found that their symptoms have improved  Co morbidities that complicate the patient evaluation  Past Medical History:  Diagnosis Date   Anemia    Anxiety    Asthma    Complication from renal dialysis device 11/02/2013   Complication of anesthesia 2017   according to pt and spouse pt was moving around and cough while under and pt had difficulty waking up so the anesthesia had to be reversed. and pt admitted to ICU.    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/2008   Qualifier: Diagnosis of  By: Russella Dar MD Marylu Lund    ESRD    on HD, T-TH-Sat - [place]   Hemiparesis  due to old stroke [facility])    left   HFrEF (heart failure with reduced ejection fraction) (HCC)  NICM // TTE 06/2022: EF 25-30, sever RV dysfunction, mod to severe MR   Hypertension    Hx, not current problems, no meds   LV dysfunction    EF 25-30% by echo 07/2011   Memory loss due to medical condition    due to stroke   MR (mitral regurgitation)    moderate to severe, echo 07/2011   Nausea with vomiting, unspecified 06/15/2021   Paroxysmal atrial fibrillation (HCC)    Peripheral vascular disease (HCC)    Shortness of breath    with exertion   Stroke (HCC)    TIA's-left sided weakness   Tobacco abuse       Dispostion: Disposition decision including need for hospitalization was considered, and patient admitted to the hospital.    Final Clinical Impression(s) / ED Diagnoses Final diagnoses:  Acute hypoxic respiratory failure (HCC)     This chart was dictated using voice recognition software.  Despite best efforts to proofread,  errors can occur which can change the documentation meaning.    Lonell Grandchild, MD 02/26/23 [phone]

## 2023-02-25 NOTE — ED Triage Notes (Signed)
 Patient from home complains of SOB starting 1 hr ago. Gave 1 duo neb and became tachycardia & tachypnea. Cpap helped . 2 weeks ago d/c for pneumonia. Dialysis treatment on Saturday did not take all fluid off  due to fistula clot. Afib hx,  A&Ox 4

## 2023-02-26 DIAGNOSIS — D6959 Other secondary thrombocytopenia: Secondary | ICD-10-CM | POA: Diagnosis present

## 2023-02-26 DIAGNOSIS — J440 Chronic obstructive pulmonary disease with acute lower respiratory infection: Secondary | ICD-10-CM | POA: Diagnosis not present

## 2023-02-26 DIAGNOSIS — I2489 Other forms of acute ischemic heart disease: Secondary | ICD-10-CM | POA: Diagnosis present

## 2023-02-26 DIAGNOSIS — A419 Sepsis, unspecified organism: Secondary | ICD-10-CM | POA: Diagnosis not present

## 2023-02-26 DIAGNOSIS — I340 Nonrheumatic mitral (valve) insufficiency: Secondary | ICD-10-CM | POA: Diagnosis present

## 2023-02-26 DIAGNOSIS — N186 End stage renal disease: Secondary | ICD-10-CM | POA: Diagnosis present

## 2023-02-26 DIAGNOSIS — J9601 Acute respiratory failure with hypoxia: Secondary | ICD-10-CM | POA: Diagnosis not present

## 2023-02-26 DIAGNOSIS — I428 Other cardiomyopathies: Secondary | ICD-10-CM | POA: Diagnosis present

## 2023-02-26 DIAGNOSIS — Y95 Nosocomial condition: Secondary | ICD-10-CM | POA: Diagnosis not present

## 2023-02-26 DIAGNOSIS — Z992 Dependence on renal dialysis: Secondary | ICD-10-CM | POA: Diagnosis not present

## 2023-02-26 DIAGNOSIS — Z515 Encounter for palliative care: Secondary | ICD-10-CM | POA: Diagnosis not present

## 2023-02-26 DIAGNOSIS — J690 Pneumonitis due to inhalation of food and vomit: Secondary | ICD-10-CM | POA: Diagnosis not present

## 2023-02-26 DIAGNOSIS — I5023 Acute on chronic systolic (congestive) heart failure: Secondary | ICD-10-CM | POA: Diagnosis present

## 2023-02-26 DIAGNOSIS — E43 Unspecified severe protein-calorie malnutrition: Secondary | ICD-10-CM | POA: Diagnosis present

## 2023-02-26 DIAGNOSIS — Z66 Do not resuscitate: Secondary | ICD-10-CM | POA: Diagnosis present

## 2023-02-26 DIAGNOSIS — Y712 Prosthetic and other implants, materials and accessory cardiovascular devices associated with adverse incidents: Secondary | ICD-10-CM | POA: Diagnosis present

## 2023-02-26 DIAGNOSIS — R6521 Severe sepsis with septic shock: Secondary | ICD-10-CM | POA: Diagnosis not present

## 2023-02-26 DIAGNOSIS — R570 Cardiogenic shock: Secondary | ICD-10-CM | POA: Diagnosis not present

## 2023-02-26 DIAGNOSIS — I69354 Hemiplegia and hemiparesis following cerebral infarction affecting left non-dominant side: Secondary | ICD-10-CM | POA: Diagnosis not present

## 2023-02-26 DIAGNOSIS — E1122 Type 2 diabetes mellitus with diabetic chronic kidney disease: Secondary | ICD-10-CM | POA: Diagnosis present

## 2023-02-26 DIAGNOSIS — I472 Ventricular tachycardia, unspecified: Secondary | ICD-10-CM | POA: Diagnosis not present

## 2023-02-26 DIAGNOSIS — J9621 Acute and chronic respiratory failure with hypoxia: Secondary | ICD-10-CM | POA: Diagnosis present

## 2023-02-26 DIAGNOSIS — J189 Pneumonia, unspecified organism: Secondary | ICD-10-CM | POA: Diagnosis not present

## 2023-02-26 DIAGNOSIS — I469 Cardiac arrest, cause unspecified: Secondary | ICD-10-CM | POA: Diagnosis not present

## 2023-02-26 DIAGNOSIS — Z7189 Other specified counseling: Secondary | ICD-10-CM | POA: Diagnosis not present

## 2023-02-26 DIAGNOSIS — I132 Hypertensive heart and chronic kidney disease with heart failure and with stage 5 chronic kidney disease, or end stage renal disease: Secondary | ICD-10-CM | POA: Diagnosis present

## 2023-02-26 DIAGNOSIS — E8770 Fluid overload, unspecified: Principal | ICD-10-CM

## 2023-02-26 DIAGNOSIS — Z789 Other specified health status: Secondary | ICD-10-CM | POA: Diagnosis not present

## 2023-02-26 DIAGNOSIS — D631 Anemia in chronic kidney disease: Secondary | ICD-10-CM | POA: Diagnosis present

## 2023-02-26 DIAGNOSIS — Q613 Polycystic kidney, unspecified: Secondary | ICD-10-CM | POA: Diagnosis not present

## 2023-02-26 LAB — HEPATITIS B SURFACE ANTIGEN: Hepatitis B Surface Ag: NONREACTIVE

## 2023-02-26 LAB — MAGNESIUM: Magnesium: 2.3 mg/dL (ref 1.7–2.4)

## 2023-02-26 LAB — RENAL FUNCTION PANEL
Albumin: 2.3 g/dL — ABNORMAL LOW (ref 3.5–5.0)
Anion gap: 16 — ABNORMAL HIGH (ref 5–15)
BUN: 69 mg/dL — ABNORMAL HIGH (ref 6–20)
CO2: 23 mmol/L (ref 22–32)
Calcium: 9.2 mg/dL (ref 8.9–10.3)
Chloride: 99 mmol/L (ref 98–111)
Creatinine, Ser: 13.51 mg/dL — ABNORMAL HIGH (ref 0.61–1.24)
GFR, Estimated: 4 mL/min — ABNORMAL LOW (ref >60)
Glucose, Bld: 76 mg/dL (ref 70–99)
Phosphorus: 9.9 mg/dL — ABNORMAL HIGH (ref 2.5–4.6)
Potassium: 5.8 mmol/L — ABNORMAL HIGH (ref 3.5–5.1)
Sodium: 138 mmol/L (ref 135–145)

## 2023-02-26 LAB — CBC
HCT: 35.3 % — ABNORMAL LOW (ref 39.0–52.0)
Hemoglobin: 10.4 g/dL — ABNORMAL LOW (ref 13.0–17.0)
MCH: 24.4 pg — ABNORMAL LOW (ref 26.0–34.0)
MCHC: 29.5 g/dL — ABNORMAL LOW (ref 30.0–36.0)
MCV: 82.7 fL (ref 80.0–100.0)
Platelets: 134 10*3/uL — ABNORMAL LOW (ref 150–400)
RBC: 4.27 MIL/uL (ref 4.22–5.81)
RDW: 18.1 % — ABNORMAL HIGH (ref 11.5–15.5)
WBC: 6 10*3/uL (ref 4.0–10.5)
nRBC: 0 % (ref 0.0–0.2)

## 2023-02-26 LAB — TROPONIN I (HIGH SENSITIVITY)
Troponin I (High Sensitivity): 84 ng/L — ABNORMAL HIGH (ref <18)
Troponin I (High Sensitivity): 96 ng/L — ABNORMAL HIGH (ref <18)

## 2023-02-26 MED ORDER — CALCIUM GLUCONATE-NACL 1-0.675 GM/50ML-% IV SOLN
INTRAVENOUS | Status: AC
Start: 2023-02-26 — End: 2023-02-26
  Filled 2023-02-26: qty 50

## 2023-02-26 MED ORDER — ACETAMINOPHEN 500 MG PO TABS
500.0000 mg | ORAL_TABLET | Freq: Four times a day (QID) | ORAL | Status: DC | PRN
  Filled 2023-02-26: qty 1

## 2023-02-26 MED ORDER — ALTEPLASE 2 MG IJ SOLR: 2.0000 mg | Freq: Once | INTRAMUSCULAR | Status: DC | PRN

## 2023-02-26 MED ORDER — SUCROFERRIC OXYHYDROXIDE 500 MG PO CHEW
1000.0000 mg | CHEWABLE_TABLET | Freq: Two times a day (BID) | ORAL | Status: DC
  Administered 2023-02-26 – 2023-02-27 (×3): 1000 mg via ORAL
  Filled 2023-02-26 (×5): qty 2

## 2023-02-26 MED ORDER — LORATADINE 10 MG PO TABS
10.0000 mg | ORAL_TABLET | Freq: Every day | ORAL | Status: DC | PRN
Start: 2023-02-26 — End: 2023-02-28

## 2023-02-26 MED ORDER — HEPARIN SODIUM (PORCINE) 1000 UNIT/ML DIALYSIS: 1000.0000 [IU] | INTRAMUSCULAR | Status: DC | PRN

## 2023-02-26 MED ORDER — ACETAMINOPHEN 325 MG PO TABS: 650.0000 mg | ORAL_TABLET | Freq: Four times a day (QID) | ORAL | Status: DC | PRN

## 2023-02-26 MED ORDER — APIXABAN 5 MG PO TABS
5.0000 mg | ORAL_TABLET | Freq: Two times a day (BID) | ORAL | Status: DC
  Administered 2023-02-26 – 2023-02-27 (×4): 5 mg via ORAL
  Filled 2023-02-26 (×4): qty 1

## 2023-02-26 MED ORDER — LIDOCAINE HCL (PF) 1 % IJ SOLN: 5.0000 mL | INTRAMUSCULAR | Status: DC | PRN

## 2023-02-26 MED ORDER — LIDOCAINE-PRILOCAINE 2.5-2.5 % EX CREA: 1.0000 | TOPICAL_CREAM | CUTANEOUS | Status: DC | PRN

## 2023-02-26 MED ORDER — MIDODRINE HCL 5 MG PO TABS
5.0000 mg | ORAL_TABLET | ORAL | Status: DC
  Administered 2023-02-26 – 2023-02-27 (×4): 5 mg via ORAL
  Filled 2023-02-26 (×4): qty 1

## 2023-02-26 MED ORDER — HEPARIN SODIUM (PORCINE) 1000 UNIT/ML IJ SOLN
4200.0000 [IU] | INTRAMUSCULAR | Status: DC | PRN
  Administered 2023-02-26: 4200 [IU]
  Administered 2023-03-01: 2100 [IU]
  Filled 2023-02-26 (×3): qty 5

## 2023-02-26 MED ORDER — MIDODRINE HCL 5 MG PO TABS
5.0000 mg | ORAL_TABLET | Freq: Once | ORAL | Status: AC
  Administered 2023-02-26: 5 mg via ORAL
  Filled 2023-02-26: qty 1

## 2023-02-26 MED ORDER — PROCHLORPERAZINE EDISYLATE 10 MG/2ML IJ SOLN
10.0000 mg | Freq: Four times a day (QID) | INTRAMUSCULAR | Status: DC | PRN
  Administered 2023-03-01: 10 mg via INTRAVENOUS
  Filled 2023-02-26 (×2): qty 2

## 2023-02-26 MED ORDER — IPRATROPIUM-ALBUTEROL 0.5-2.5 (3) MG/3ML IN SOLN
3.0000 mL | Freq: Four times a day (QID) | RESPIRATORY_TRACT | Status: DC | PRN
Start: 2023-02-26 — End: 2023-03-09

## 2023-02-26 MED ORDER — ANTICOAGULANT SODIUM CITRATE 4% (200MG/5ML) IV SOLN: 5.0000 mL | Status: DC | PRN

## 2023-02-26 MED ORDER — PENTAFLUOROPROP-TETRAFLUOROETH EX AERO: 1.0000 | INHALATION_SPRAY | CUTANEOUS | Status: DC | PRN

## 2023-02-26 NOTE — Progress Notes (Signed)
 Pt placed back on BIPAP per pt request, stated he feel like he can't breath. Pt tolerating well.

## 2023-02-26 NOTE — Progress Notes (Signed)
 Nathaniel White  WUJ:[id number] DOB: 07-06-1960 DOA: 02/25/2023 PCP: Claiborne Rigg, NP    Brief Narrative:  63 year old with a history of ESRD on HD TTS, COPD on chronic 2.5 L home oxygen, chronic systolic congestive heart failure with EF 35-40%, severe mitral valve regurgitation, PAF on chronic Eliquis, chronic hypotension on midodrine, anemia of chronic kidney disease, PVD, and CVA/TIA who was admitted to the hospital 1/27-1/29 with volume overload and hyperkalemia which was managed with dialysis.  He returned to the ER 2/17 with severe shortness of breath in the setting of not having finished his most recent dialysis treatment on Saturday due to issues related to a malfunctioning dialysis catheter.  At presentation his saturations were 85% on room air and he required use of BiPAP.  CXR revealed pulmonary edema and a small left pleural effusion.  Goals of Care:   Code Status: Full Code   DVT prophylaxis:  apixaban (ELIQUIS) tablet 5 mg   Interim Hx: The patient was examined and interviewed by one of my partners earlier today.  Afebrile since presentation.  Vital signs stable.  Oxygen saturation stable on BiPAP support.  He is seen for a follow-up visit in the hemodialysis unit at which time he is alert conversant and in no significant distress.  He is tolerating his HD treatment well.  Assessment & Plan:  Acute exacerbation of chronic hypoxic respiratory failure due to volume overload/pulmonary edema -acute exacerbation of chronic systolic congestive heart failure EF 35-40% on TTE December 2024 -volume management per HD -acute exacerbation due to inadequate dialysis in the outpatient setting due to malfunction of his HD catheter  ESRD with malfunctioning HD catheter Care per Nephrology -it appears that the clotting issues with the patient's catheter were able to be resolved during dialysis today  Severe mitral valve regurgitation  Hypocalcemia Calcium 5.7 at presentation - dosed with  calcium gluconate in the ER -monitor trend  Elevated troponin Not markedly elevated given that the patient is ESRD -most consistent with demand ischemia in the setting of severe volume overload  QTc prolongation 507 on EKG at presentation -monitoring on telemetry -avoiding medications that may prolong QTc  COPD Well compensated  PAF Continue Eliquis  Chronic hypotension Continue midodrine  Anemia of chronic kidney disease Hemoglobin at baseline   Family Communication:  Disposition: Anticipate discharge home once adequate catch-up HD completed   Objective: Blood pressure (!) 150/110, pulse 93, temperature 97.9 F (36.6 C), temperature source Oral, resp. rate 20, height 5\' 9"  (1.753 m), weight 68 kg, SpO2 100%.  Intake/Output Summary (Last 24 hours) at 02/26/2023 1423 Last data filed at 02/26/2023 1221 Gross per 24 hour  Intake --  Output 2300 ml  Net -2300 ml   Filed Weights   02/26/23 1314  Weight: 68 kg    Examination: Patient was examined by one of my partners earlier today.  CBC: Recent Labs  Lab 02/25/23 2133 02/25/23 2226 02/26/23 0820  WBC 6.9  --  6.0  NEUTROABS 4.3  --   --   HGB 11.7* 12.9* 10.4*  HCT 39.0 38.0* 35.3*  MCV 83.2  --  82.7  PLT 128*  --  134*   Basic Metabolic Panel: Recent Labs  Lab 02/25/23 2133 02/25/23 2226 02/26/23 0641  NA 142 129* 138  K 4.8 >8.5* 5.8*  CL 117*  --  99  CO2 15*  --  23  GLUCOSE 77  --  76  BUN 47*  --  69*  CREATININE 8.29*  --  13.51*  CALCIUM 5.7*  --  9.2  MG  --   --  2.3  PHOS  --   --  9.9*   GFR: Estimated Creatinine Clearance: 5.6 mL/min (A) (by C-G formula based on SCr of 13.51 mg/dL (H)).   Scheduled Meds:  apixaban  5 mg Oral BID   [START ON 02/27/2023] midodrine  5 mg Oral 3 times per day on Sunday Monday Wednesday Friday   sucroferric oxyhydroxide  1,000 mg Oral BID PC   Continuous Infusions:  heparin sodium (porcine)       LOS: 0 days   Lonia Blood, MD [facility] Office  [phone] Pager - Text Page per Loretha Stapler  If 7PM-7AM, please contact night-coverage per Amion 02/26/2023, 2:23 PM

## 2023-02-26 NOTE — Telephone Encounter (Signed)
No Problem :) 

## 2023-02-26 NOTE — ED Notes (Addendum)
 BIPAP refused by patient , 4 LPM/[place] applied.

## 2023-02-26 NOTE — Progress Notes (Signed)
 RT assisted with transport of this pt from ED to hemodialysis while on BiPAP. Pt tolerated well with SVS.

## 2023-02-26 NOTE — ED Notes (Signed)
Report given to hemodialysis nurse.

## 2023-02-26 NOTE — Telephone Encounter (Signed)
 Thank you :)

## 2023-02-26 NOTE — Progress Notes (Signed)
Pt receives out-pt HD at [facility] on [address]. Will assist as needed.   Olivia Canter Renal Navigator [phone]

## 2023-02-26 NOTE — Telephone Encounter (Signed)
 Call to patient unable to reach. Spoke with patient health care  Agent,  Rosaura Carpenter . Patient went to ED with Chest pain. Patient has been admitted under observation at [facility]. Advised once patient in out of the hospital to call for an appointment with PCP.

## 2023-02-26 NOTE — H&P (Signed)
 History and Physical    Nathaniel White QMV:[id number] DOB: 29-Apr-1960 DOA: 02/25/2023  PCP: Claiborne Rigg, NP  Patient coming from: Home  Chief Complaint: Shortness of breath  HPI: Nathaniel White is a 63 y.o. male with medical history significant of ESRD on HD TTS, COPD, chronic hypoxemic respiratory failure on 2.5 L home oxygen, chronic HFrEF (EF 35-40% 12/2022), severe mitral regurgitation, paroxysmal A-fib on Eliquis, chronic hypotension on midodrine, chronic anemia, PVD, CVA/TIA.  He was recently admitted to the hospital 1/27-1/29 for volume overload/pulmonary edema and hyperkalemia managed with dialysis.  Patient presents to the ED via EMS with shortness of breath in the setting of not finishing his last dialysis treatment on Saturday due to issues with his dialysis catheter.  Satting 85% on room air and placed on BiPAP.  Afebrile.  Labs showing no leukocytosis, hemoglobin 11.7 (close to baseline), platelet count 128k (chronically low and stable), chloride 117, bicarb 15, calcium 5.7, albumin <1.5, BNP 2467, troponin 56> 84.  EKG without acute ischemic changes.  VBG with pH 7.36 and pCO2 43.1. Potassium >8.5 on VBG with nephrology felt was a false reading as potassium was 4.8 on CMP.  Chest x-ray showing pulmonary edema and small left effusion.  Nephrology planning on taking him for dialysis in the morning.  IV calcium gluconate 2 g given for hypocalcemia.  TRH called to admit.  History limited as patient is currently on BiPAP.  States he was not able to finish his last dialysis treatment on Saturday due to issues with his dialysis catheter.  Yesterday evening he started struggling to breathe.  He was also coughing.  Denies chest pain and reports improvement of his dyspnea after being placed on BiPAP.  He has no other complaints.  Denies fevers, vomiting, diarrhea, or abdominal pain.  Review of Systems:  Review of Systems  All other systems reviewed and are negative.   Past Medical  History:  Diagnosis Date   Anemia    Anxiety    Asthma    Complication from renal dialysis device 11/02/2013   Complication of anesthesia 2017   according to pt and spouse pt was moving around and cough while under and pt had difficulty waking up so the anesthesia had to be reversed. and pt admitted to ICU.    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/2008   Qualifier: Diagnosis of  By: Russella Dar MD Marylu Lund    ESRD    on HD, T-TH-Sat - [place]   Hemiparesis due to old stroke [facility])    left   HFrEF (heart failure with reduced ejection fraction) (HCC)    NICM // TTE 06/2022: EF 25-30, sever RV dysfunction, mod to severe MR   Hypertension    Hx, not current problems, no meds   LV dysfunction    EF 25-30% by echo 07/2011   Memory loss due to medical condition    due to stroke   MR (mitral regurgitation)    moderate to severe, echo 07/2011   Nausea with vomiting, unspecified 06/15/2021   Paroxysmal atrial fibrillation (HCC)    Peripheral vascular disease (HCC)    Shortness of breath    with exertion   Stroke (HCC)    TIA's-left sided weakness   Tobacco  abuse     Past Surgical History:  Procedure Laterality Date   ABDOMINAL AORTOGRAM W/LOWER EXTREMITY Bilateral 07/21/2018   Procedure: ABDOMINAL AORTOGRAM W/LOWER EXTREMITY;  Surgeon: Maeola Harman, MD;  Location: [facility] INVASIVE CV LAB;  Service: Cardiovascular;  Laterality: Bilateral;   AMPUTATION Left 11/05/2018   Procedure: AMPUTATION SECOND TOE LEFT FOOT;  Surgeon: Maeola Harman, MD;  Location: [facility] OR;  Service: Vascular;  Laterality: Left;   AMPUTATION Left 02/13/2021   Procedure: Left small finger AMPUTATION DIGIT;  Surgeon: Marlyne Beards, MD;  Location: [facility] OR;  Service: Orthopedics;   Laterality: Left;   AV FISTULA PLACEMENT     CARDIAC CATHETERIZATION     [place] [facility]   COLONOSCOPY W/ BIOPSIES AND POLYPECTOMY     FISTULA SUPERFICIALIZATION Left 11/10/2013   Procedure: FISTULA PLICATION;  Surgeon: Nada Libman, MD;  Location: [facility] OR;  Service: Vascular;  Laterality: Left;   INSERTION OF DIALYSIS CATHETER N/A 06/20/2021   Procedure: INSERTION OF DIALYSIS CATHETER;  Surgeon: Victorino Sparrow, MD;  Location: [facility] OR;  Service: Vascular;  Laterality: N/A;   KIDNEY TRANSPLANT     2011 rejected kidney 2012 back on dialysis   LEFT AND RIGHT HEART CATHETERIZATION WITH CORONARY ANGIOGRAM N/A 10/09/2013   Procedure: LEFT AND RIGHT HEART CATHETERIZATION WITH CORONARY ANGIOGRAM;  Surgeon: Kathleene Hazel, MD;  Location: [facility] CATH LAB;  Service: Cardiovascular;  Laterality: N/A;   LIGATION OF ARTERIOVENOUS  FISTULA Left 06/20/2021   Procedure: LIGATION OF ARTERIOVENOUS  FISTULA;  Surgeon: Victorino Sparrow, MD;  Location: [facility] OR;  Service: Vascular;  Laterality: Left;   PERIPHERAL VASCULAR INTERVENTION Left 07/21/2018   Procedure: PERIPHERAL VASCULAR INTERVENTION;  Surgeon: Maeola Harman, MD;  Location: [facility] INVASIVE CV LAB;  Service: Cardiovascular;  Laterality: Left;  Popliteal   REVISON OF ARTERIOVENOUS FISTULA Left 08/26/2013   Procedure: EXCISION OF ERODED SKIN AND EXPLORATION OF MAIN LEFT UPPER ARM AV FISTULA;  Surgeon: Larina Earthly, MD;  Location: [facility] OR;  Service: Vascular;  Laterality: Left;   REVISON OF ARTERIOVENOUS FISTULA Left 02/05/2014   Procedure: REPAIR OF ARTERIOVENOUS FISTULA ANEURYSM;  Surgeon: Nada Libman, MD;  Location: [facility] OR;  Service: Vascular;  Laterality: Left;   REVISON OF ARTERIOVENOUS FISTULA Left 03/10/2021   Procedure: LEFT ARM REVISION OF ARTERIOVENOUS FISTULA WITH PLICATION;  Surgeon: Maeola Harman, MD;  Location: [facility] OR;  Service: Vascular;  Laterality: Left;   RIGHT/LEFT HEART CATH AND CORONARY ANGIOGRAPHY N/A 06/21/2022    Procedure: RIGHT/LEFT HEART CATH AND CORONARY ANGIOGRAPHY;  Surgeon: Lennette Bihari, MD;  Location: [facility] INVASIVE CV LAB;  Service: Cardiovascular;  Laterality: N/A;   SCROTAL EXPLORATION N/A 09/16/2022   Procedure: PENILE DEBRIDMENT;  Surgeon: Rene Paci, MD;  Location: [facility] OR;  Service: Urology;  Laterality: N/A;   SHUNTOGRAM N/A 11/05/2012   Procedure: Fistulogram;  Surgeon: Nada Libman, MD;  Location: [facility] CATH LAB;  Service: Cardiovascular;  Laterality: N/A;   TEE WITHOUT CARDIOVERSION  08/17/2011   Procedure: TRANSESOPHAGEAL ECHOCARDIOGRAM (TEE);  Surgeon: Laurey Morale, MD;  Location: [facility] ENDOSCOPY;  Service: Cardiovascular;  Laterality: N/A;   ULTRASOUND GUIDANCE FOR VASCULAR ACCESS  06/20/2021   Procedure: ULTRASOUND GUIDANCE FOR VASCULAR ACCESS;  Surgeon: Victorino Sparrow, MD;  Location: [facility] OR;  Service: Vascular;;   VIDEO ASSISTED THORACOSCOPY (VATS)/DECORTICATION  08/10/11     reports that he quit smoking about 11 years ago. His smoking use included cigarettes. He started smoking about 26 years ago. He has  a 7.5 pack-year smoking history. He has never used smokeless tobacco. He reports that he does not drink alcohol and does not use drugs.  Allergies  Allergen Reactions   Codeine Shortness Of Breath   Dextromethorphan-Guaifenesin Shortness Of Breath and Other (See Comments)   Iron Dextran Shortness Of Breath and Other (See Comments)    Pt does not recall 02/05/23   Morphine And Codeine Shortness Of Breath    Other Reaction(s): Other (See Comments)    Family History  Problem Relation Age of Onset   Hypertension Mother    Varicose Veins Mother    Diabetes Paternal Grandmother    Cancer Paternal Grandfather    CAD Paternal Uncle     Prior to Admission medications   Medication Sig Start Date End Date Taking? Authorizing Provider  acetaminophen (TYLENOL) 500 MG tablet Take 1 tablet (500 mg total) by mouth every 6 (six) hours as needed. Patient taking differently:  Take 500 mg by mouth every 6 (six) hours as needed for mild pain (pain score 1-3). 04/11/18  Yes Burky, Barron Alvine, NP  albuterol (PROAIR HFA) 108 (90 Base) MCG/ACT inhaler Inhale 2 puffs into the lungs every 6 (six) hours as needed for wheezing or shortness of breath. 11/30/22  Yes Rai, Ripudeep K, MD  ELIQUIS 5 MG TABS tablet Take 5 mg by mouth 2 (two) times daily. 12/09/21  Yes [provider]  levocetirizine (XYZAL) 5 MG tablet Take 5 mg by mouth daily as needed for allergies.   Yes [provider]  midodrine (PROAMATINE) 5 MG tablet Take 5 mg by mouth 3 (three) times daily. Take one tablet by mouth three times daily on non-dialysis days. Monday, Wednesday, Friday, and Sunday. 01/21/23  Yes [provider]  VELPHORO 500 MG chewable tablet Chew 1,000 mg by mouth 2 (two) times daily after a meal. 02/06/21  Yes [provider]  VISINE 0.05 % ophthalmic solution Place 2-3 drops into both eyes 2 (two) times daily as needed (dry eye). 07/04/21  Yes [provider]  Tenapanor HCl, CKD, 30 MG TABS Take 1 tablet by mouth daily. Patient not taking: Reported on 02/26/2023    [provider]    Physical Exam: Vitals:   02/26/23 0245 02/26/23 0400 02/26/23 0430 02/26/23 0459  BP: 128/71 129/74 129/67   Pulse:  62    Resp: 18 20 20    Temp:    (!) 97.4 F (36.3 C)  TempSrc:    Temporal  SpO2:  98%      Physical Exam Vitals reviewed.  Constitutional:      General: He is not in acute distress. HENT:     Head: Normocephalic and atraumatic.  Eyes:     Extraocular Movements: Extraocular movements intact.  Cardiovascular:     Rate and Rhythm: Normal rate and regular rhythm.     Pulses: Normal pulses.  Pulmonary:     Effort: Pulmonary effort is normal. No respiratory distress.     Breath sounds: Rales present. No wheezing.  Abdominal:     General: Bowel sounds are normal. There is no distension.     Palpations: Abdomen is soft.     Tenderness: There  is no abdominal tenderness. There is no guarding.  Musculoskeletal:     Cervical back: Normal range of motion.     Right lower leg: No edema.     Left lower leg: No edema.  Skin:    General: Skin is warm and dry.  Neurological:  General: No focal deficit present.     Mental Status: He is alert and oriented to person, place, and time.     Labs on Admission: I have personally reviewed following labs and imaging studies  CBC: Recent Labs  Lab 02/25/23 2133 02/25/23 2226  WBC 6.9  --   NEUTROABS 4.3  --   HGB 11.7* 12.9*  HCT 39.0 38.0*  MCV 83.2  --   PLT 128*  --    Basic Metabolic Panel: Recent Labs  Lab 02/25/23 2133 02/25/23 2226  NA 142 129*  K 4.8 >8.5*  CL 117*  --   CO2 15*  --   GLUCOSE 77  --   BUN 47*  --   CREATININE 8.29*  --   CALCIUM 5.7*  --    GFR: CrCl cannot be calculated (Unknown ideal weight.). Liver Function Tests: Recent Labs  Lab 02/25/23 2133  AST 38  ALT 9  ALKPHOS 38  BILITOT 1.0  PROT 4.5*  ALBUMIN <1.5*   No results for input(s): "LIPASE", "AMYLASE" in the last 168 hours. No results for input(s): "AMMONIA" in the last 168 hours. Coagulation Profile: No results for input(s): "INR", "PROTIME" in the last 168 hours. Cardiac Enzymes: No results for input(s): "CKTOTAL", "CKMB", "CKMBINDEX", "TROPONINI" in the last 168 hours. BNP (last 3 results) No results for input(s): "PROBNP" in the last 8760 hours. HbA1C: No results for input(s): "HGBA1C" in the last 72 hours. CBG: No results for input(s): "GLUCAP" in the last 168 hours. Lipid Profile: No results for input(s): "CHOL", "HDL", "LDLCALC", "TRIG", "CHOLHDL", "LDLDIRECT" in the last 72 hours. Thyroid Function Tests: No results for input(s): "TSH", "T4TOTAL", "FREET4", "T3FREE", "THYROIDAB" in the last 72 hours. Anemia Panel: No results for input(s): "VITAMINB12", "FOLATE", "FERRITIN", "TIBC", "IRON", "RETICCTPCT" in the last 72 hours. Urine analysis:    Component Value  Date/Time   COLORURINE YELLOW 02/13/2010 0703   APPEARANCEUR CLOUDY (A) 02/13/2010 0703   LABSPEC 1.013 02/13/2010 0703   PHURINE 8.5 (H) 02/13/2010 0703   GLUCOSEU NEGATIVE 12/18/2009 0546   HGBUR LARGE (A) 02/13/2010 0703   BILIRUBINUR NEGATIVE 02/13/2010 0703   KETONESUR NEGATIVE 02/13/2010 0703   PROTEINUR >300 (A) 02/13/2010 0703   UROBILINOGEN 0.2 02/13/2010 0703   NITRITE NEGATIVE 02/13/2010 0703   LEUKOCYTESUR NEGATIVE 02/13/2010 0703    Radiological Exams on Admission: DG Chest Portable 1 View Result Date: 02/25/2023 CLINICAL DATA:  Shortness of breath EXAM: PORTABLE CHEST 1 VIEW COMPARISON:  02/04/2023, 01/16/2023, CT 12/27/2022, chest x-ray 09/04/2021, 03/03/2018 FINDINGS: Left-sided central venous catheter tip at the right atrium. Chronic pleural and parenchymal scarring on the right. Cardiomegaly with vascular congestion and probable mild interstitial edema. Suspicion of small left effusion. Similar peripheral bandlike opacities in the left mid to lower lung which could be due to chronic scarring or atelectasis. IMPRESSION: 1. Cardiomegaly with vascular congestion and probable mild interstitial edema. Suspicion of small left effusion. 2. Chronic pleural and parenchymal scarring on the right. Similar peripheral bandlike opacities in the left mid to lower lung which could be due to chronic scarring or atelectasis. Electronically Signed   By: Jasmine Pang M.D.   On: 02/25/2023 22:06    EKG: Independently reviewed.  Sinus rhythm with first-degree AV block, QTc 507.  No acute ischemic changes in comparison with previous EKGs.  Assessment and Plan  Acute on chronic hypoxemic respiratory failure secondary to volume overload/pulmonary edema in the setting of ESRD on HD, acute on chronic HFrEF (EF 35-40% 12/2022), and known  severe mitral regurgitation Patient was not able to finish his last dialysis treatment due to issues with his dialysis catheter.  Oxygen saturation 85% on room air,  currently stable on BiPAP.  Nephrology planning for dialysis this morning.  Continue BiPAP, wean as tolerated.  Hypocalcemia IV calcium gluconate 2 g given and nephrology planning for dialysis in the morning.  Continue to monitor labs.  Metabolic acidosis Blood gas with normal pH.  Nephrology planning for dialysis this morning.  Continue to monitor labs.  Elevated troponin Likely due to demand ischemia in the setting of volume overload.  Troponin 56> 84.  EKG without acute ischemic changes and patient is not endorsing chest pain.  Continue to trend troponin.  QT prolongation QTc 507 on EKG.  No hypokalemia on labs.  Check magnesium level.  Avoid QT prolonging drugs.  COPD Stable, no wheezing.  DuoNeb as needed.  Paroxysmal A-fib Currently in sinus rhythm.  Continue Eliquis.  Chronic hypotension Continue midodrine.  Chronic anemia Hemoglobin close to baseline.  Chronic thrombocytopenia Platelet count mildly low and stable.  DVT prophylaxis: Eliquis Code Status: Full Code (discussed with the patient) Level of care: Progressive Care Unit Admission status: It is my clinical opinion that referral for OBSERVATION is reasonable and necessary in this patient based on the above information provided. The aforementioned taken together are felt to place the patient at high risk for further clinical deterioration. However, it is anticipated that the patient may be medically stable for discharge from the hospital within 24 to 48 hours.  John Giovanni MD [facility]  If 7PM-7AM, please contact night-coverage [email or web address]  02/26/2023, 5:38 AM

## 2023-02-26 NOTE — Progress Notes (Signed)
 Received patient in bed to unit.  Alert and oriented.  Informed consent signed and in chart.   TX duration: 3 Hours and 30 minutes  Patient BP low a lot of the session.  Nephrology aware. Transported back to the room  Alert, without acute distress.  Hand-off given to patient's nurse.   Access used: Left internal jugular HD cath Access issues: none  Total UF removed: 2.3L Medication(s) given: Midodrine   02/26/23 1221  Vitals  Temp 98.5 F (36.9 C)  Temp Source Axillary  BP 99/84  MAP (mmHg) 90  Pulse Rate 89  ECG Heart Rate 94  Resp (!) 22  Oxygen Therapy  SpO2 97 %  O2 Device Nasal Cannula  O2 Flow Rate (L/min) 6 L/min  During Treatment Monitoring  Duration of HD Treatment -hour(s) 3.5 hour(s)  HD Safety Checks Performed Yes  Intra-Hemodialysis Comments Tx completed  Dialysis Fluid Bolus Normal Saline  Bolus Amount (mL) 300 mL  Post Treatment  Dialyzer Clearance Clear  Liters Processed 84  Fluid Removed (mL) 2300 mL  Tolerated HD Treatment No (Comment)  Post-Hemodialysis Comments Patient BP low a lot during session.  Nephrology team aware.  Hemodialysis Catheter Left Subclavian Double lumen Permanent (Tunneled)  Placement Date/Time: 06/20/21 0851   Placed prior to admission: No  Time Out: Correct patient;Correct site;Correct procedure  Maximum sterile barrier precautions: Hand hygiene;Cap;Mask;Sterile gown;Sterile gloves;Large sterile sheet  Site Prep: (c) Ot...  Site Condition No complications  Blue Lumen Status Flushed;Heparin locked;Dead end cap in place  Red Lumen Status Flushed;Heparin locked;Dead end cap in place  Purple Lumen Status N/A  Catheter fill solution Heparin 1000 units/ml  Catheter fill volume (Arterial) 2.1 cc  Catheter fill volume (Venous) 2.1  Dressing Type Transparent  Dressing Status Antimicrobial disc/dressing in place;Clean, Dry, Intact  Interventions Other (Comment) (deaccessed)  Drainage Description None  Dressing Change Due 03/05/23   Post treatment catheter status Capped and Clamped     Stacie Glaze LPN [facility]

## 2023-02-26 NOTE — ED Notes (Signed)
 Admitting MD at bedside.

## 2023-02-26 NOTE — ED Notes (Signed)
 BIPAP reapplied by RT per patient's request .

## 2023-02-26 NOTE — Progress Notes (Signed)
 [facility] Progress Note  Subjective:  Seen in HD, took off 2.3 L  Breathing is better Bipap off and now is on 6 L [place] At home uses 2.5- 3 L [place] continuous  Vitals:   02/26/23 0805 02/26/23 0808 02/26/23 0831 02/26/23 0900  BP:  122/76 132/75 127/77  Pulse:  (!) 56 (!) 54 (!) 28  Resp:  (!) 21 18 20   Temp:  (!) 97.3 F (36.3 C)    TempSrc:      SpO2: 94% 95% 97% 100%    Exam: Gen alert, no distress, [place] O2 6 L  No rash, cyanosis or gangrene Sclera anicteric, throat clear  No jvd or bruits Chest occ scattered crackles at bases, o/w clear RRR no MRG Abd soft ntnd no mass or ascites +bs GU normal male MS no joint effusions or deformity Ext 1+ PT edema, no wounds or ulcers Neuro is alert, Ox 3 , nf  TDC clear exit site     OP HD:   Renal-related home meds: - midodrine 5mg  three times per week - velphoro 1 gm ac tid - others: eliquis, albuterol    OP HD: SW TTS  4h  B400   67.5kg   2K bath  TDC   Heparin none - last HD 2/15, post wt 69kg, coming off 0.5- 1.5 kg over - good attendance, rarely stays on past 3.5 hrs - hectorol 3 mcg iv three times per week - parsabiv 7.5mg     CXR 2/17 - IMPRESSION: vascular congestion and probable mild interstitial edema. Small left effusion. 2. Chronic pleural and parenchymal scarring on the right. Similar peripheral bandlike opacities in the left mid to lower lung which could be due to chronic scarring or atelectasis.   Assessment/ Plan: Acute on chronic hypoxic resp failure - required BiPAP in ED. On HD this am upstairs w/ max uf ESRD - on HD TTS. HD today in progress.  Hyperkalemia - spurious lab error. K 5.8 today.  Secondary hyperparathyroidism - CCa in range. 10.4 corrected this am. Phos high, cont binders Anemia of esrd - hemoglobins are stable, no esa needs.  COPD - on home O2 2.5- 3 L  H/o CVA HFrEF PAF     Vinson Moselle MD  [facility] 02/26/2023, 9:27 AM  Recent Labs  Lab 02/25/23 2133  02/25/23 2226 02/26/23 0641  HGB 11.7* 12.9*  --   ALBUMIN <1.5*  --  2.3*  CALCIUM 5.7*  --  9.2  PHOS  --   --  9.9*  CREATININE 8.29*  --  13.51*  K 4.8 >8.5* 5.8*   No results for input(s): "IRON", "TIBC", "FERRITIN" in the last 168 hours. Inpatient medications:  apixaban  5 mg Oral BID   [START ON 02/27/2023] midodrine  5 mg Oral 3 times per day on Sunday Monday Wednesday Friday   sucroferric oxyhydroxide  1,000 mg Oral BID PC    anticoagulant sodium citrate     acetaminophen, alteplase, anticoagulant sodium citrate, heparin, ipratropium-albuterol, lidocaine (PF), lidocaine-prilocaine, loratadine, pentafluoroprop-tetrafluoroeth, prochlorperazine

## 2023-02-26 NOTE — Progress Notes (Signed)
 Pt taken off BIPAP and placed on 4LNC. Pt seems comfortable at this time

## 2023-02-26 NOTE — Procedures (Signed)
 I was present at the procedure, reviewed the HD regimen and made appropriate changes.   Vinson Moselle MD  [facility] 02/26/2023, 12:27 PM

## 2023-02-26 NOTE — ED Notes (Signed)
 Calcium Gluconate 1 gram IV completed .

## 2023-02-27 DIAGNOSIS — N186 End stage renal disease: Secondary | ICD-10-CM

## 2023-02-27 DIAGNOSIS — J9601 Acute respiratory failure with hypoxia: Secondary | ICD-10-CM | POA: Diagnosis not present

## 2023-02-27 DIAGNOSIS — E8770 Fluid overload, unspecified: Secondary | ICD-10-CM | POA: Diagnosis not present

## 2023-02-27 DIAGNOSIS — I5023 Acute on chronic systolic (congestive) heart failure: Secondary | ICD-10-CM | POA: Diagnosis not present

## 2023-02-27 DIAGNOSIS — Z992 Dependence on renal dialysis: Secondary | ICD-10-CM

## 2023-02-27 LAB — RENAL FUNCTION PANEL
Albumin: 2.3 g/dL — ABNORMAL LOW (ref 3.5–5.0)
Anion gap: 15 (ref 5–15)
BUN: 37 mg/dL — ABNORMAL HIGH (ref 6–20)
CO2: 23 mmol/L (ref 22–32)
Calcium: 9.3 mg/dL (ref 8.9–10.3)
Chloride: 95 mmol/L — ABNORMAL LOW (ref 98–111)
Creatinine, Ser: 8.85 mg/dL — ABNORMAL HIGH (ref 0.61–1.24)
GFR, Estimated: 6 mL/min — ABNORMAL LOW (ref >60)
Glucose, Bld: 109 mg/dL — ABNORMAL HIGH (ref 70–99)
Phosphorus: 7.2 mg/dL — ABNORMAL HIGH (ref 2.5–4.6)
Potassium: 4.8 mmol/L (ref 3.5–5.1)
Sodium: 133 mmol/L — ABNORMAL LOW (ref 135–145)

## 2023-02-27 LAB — CBC
HCT: 38.3 % — ABNORMAL LOW (ref 39.0–52.0)
Hemoglobin: 11.5 g/dL — ABNORMAL LOW (ref 13.0–17.0)
MCH: 24.9 pg — ABNORMAL LOW (ref 26.0–34.0)
MCHC: 30 g/dL (ref 30.0–36.0)
MCV: 83.1 fL (ref 80.0–100.0)
Platelets: 138 10*3/uL — ABNORMAL LOW (ref 150–400)
RBC: 4.61 MIL/uL (ref 4.22–5.81)
RDW: 18.2 % — ABNORMAL HIGH (ref 11.5–15.5)
WBC: 5.2 10*3/uL (ref 4.0–10.5)
nRBC: 0 % (ref 0.0–0.2)

## 2023-02-27 LAB — HEPATITIS B SURFACE ANTIBODY, QUANTITATIVE: Hep B S AB Quant (Post): 2478 m[IU]/mL

## 2023-02-27 MED ORDER — CHLORHEXIDINE GLUCONATE CLOTH 2 % EX PADS
6.0000 | MEDICATED_PAD | Freq: Every day | CUTANEOUS | Status: DC
  Administered 2023-02-27 – 2023-03-09 (×11): 6 via TOPICAL

## 2023-02-27 NOTE — Progress Notes (Addendum)
 [facility] Progress Note   Subjective: Patient tells me he doesn't want to go home due to snow and is asking if he needs to be tested for STD since he says he had one earlier in winter. Will defer to primary. Volume much improved. Net UF 2.3 liters with HD  Objective Vitals:   02/27/23 0000 02/27/23 0200 02/27/23 0400 02/27/23 0828  BP: 125/69  (!) 101/59 112/71  Pulse: 68 72 (!) 56   Resp: 20 19 18    Temp: 97.6 F (36.4 C)  98 F (36.7 C) 98.1 F (36.7 C)  TempSrc: Oral  Oral Oral  SpO2: 93% 100% 100%   Weight:      Height:       Physical Exam General: Chronically ill appearing male in NAD Heart: S1,S2 no M/R/G Lungs: Decreased in bases otherwise CTAB Abdomen: NABs, NT Extremities: trace ankle edema Dialysis Access: [facility] drsg intact    Additional Objective Labs: Basic Metabolic Panel: Recent Labs  Lab 02/25/23 2133 02/25/23 2226 02/26/23 0641 02/27/23 0502  NA 142 129* 138 133*  K 4.8 >8.5* 5.8* 4.8  CL 117*  --  99 95*  CO2 15*  --  23 23  GLUCOSE 77  --  76 109*  BUN 47*  --  69* 37*  CREATININE 8.29*  --  13.51* 8.85*  CALCIUM 5.7*  --  9.2 9.3  PHOS  --   --  9.9* 7.2*   Liver Function Tests: Recent Labs  Lab 02/25/23 2133 02/26/23 0641 02/27/23 0502  AST 38  --   --   ALT 9  --   --   ALKPHOS 38  --   --   BILITOT 1.0  --   --   PROT 4.5*  --   --   ALBUMIN <1.5* 2.3* 2.3*   No results for input(s): "LIPASE", "AMYLASE" in the last 168 hours. CBC: Recent Labs  Lab 02/25/23 2133 02/25/23 2226 02/26/23 0820 02/27/23 0502  WBC 6.9  --  6.0 5.2  NEUTROABS 4.3  --   --   --   HGB 11.7* 12.9* 10.4* 11.5*  HCT 39.0 38.0* 35.3* 38.3*  MCV 83.2  --  82.7 83.1  PLT 128*  --  134* 138*   Blood Culture    Component Value Date/Time   SDES BLOOD RIGHT ARM 01/15/2023 1114   SPECREQUEST  01/15/2023 1114    BOTTLES DRAWN AEROBIC AND ANAEROBIC Blood Culture results may not be optimal due to an inadequate volume of blood received in  culture bottles   CULT  01/15/2023 1114    NO GROWTH 5 DAYS Performed at [facility], [address] [place], [place] [address]    REPTSTATUS 01/20/2023 FINAL 01/15/2023 1114    Cardiac Enzymes: No results for input(s): "CKTOTAL", "CKMB", "CKMBINDEX", "TROPONINI" in the last 168 hours. CBG: No results for input(s): "GLUCAP" in the last 168 hours. Iron Studies: No results for input(s): "IRON", "TIBC", "TRANSFERRIN", "FERRITIN" in the last 72 hours. @lablastinr3 @ Studies/Results: DG Chest Portable 1 View Result Date: 02/25/2023 CLINICAL DATA:  Shortness of breath EXAM: PORTABLE CHEST 1 VIEW COMPARISON:  02/04/2023, 01/16/2023, CT 12/27/2022, chest x-ray 09/04/2021, 03/03/2018 FINDINGS: Left-sided central venous catheter tip at the right atrium. Chronic pleural and parenchymal scarring on the right. Cardiomegaly with vascular congestion and probable mild interstitial edema. Suspicion of small left effusion. Similar peripheral bandlike opacities in the left mid to lower lung which could be due to chronic scarring or atelectasis.  IMPRESSION: 1. Cardiomegaly with vascular congestion and probable mild interstitial edema. Suspicion of small left effusion. 2. Chronic pleural and parenchymal scarring on the right. Similar peripheral bandlike opacities in the left mid to lower lung which could be due to chronic scarring or atelectasis. Electronically Signed   By: Jasmine Pang M.D.   On: 02/25/2023 22:06   Medications:  heparin sodium (porcine)      apixaban  5 mg Oral BID   Chlorhexidine Gluconate Cloth  6 each Topical Daily   midodrine  5 mg Oral 3 times per day on Sunday Monday Wednesday Friday   sucroferric oxyhydroxide  1,000 mg Oral BID PC     OP HD: SW TTS  4h  B400   67.5kg   2K bath  TDC   Heparin none - last HD 2/15, post wt 69kg, coming off 0.5- 1.5 kg over - good attendance, rarely stays on past 3.5 hrs - hectorol 3 mcg iv three times per week - parsabiv 7.5mg        CXR 2/17 - IMPRESSION: vascular congestion and probable mild interstitial edema. Small left effusion. 2. Chronic pleural and parenchymal scarring on the right. Similar peripheral bandlike opacities in the left mid to lower lung which could be due to chronic scarring or atelectasis.    Assessment/ Plan: Acute on chronic hypoxic resp failure - required BiPAP in ED. On HD this am upstairs w/ max uf. Resolved with HD ESRD - on HD TTS. Next HD here 02/28/2023.   Hyperkalemia - Lab error. K+ 4.8. Renal diet. Follow labs.   Secondary hyperparathyroidism - CCa 10.7 Hold hectorol. Phos high, cont binders Anemia of esrd - hemoglobins are stable, no esa needs.  COPD - on home O2 2.5- 3 L [place] H/o CVA HFrEF PAF  Disposition: DC canceled D/T weather. Will have HD here in AM.   Ed Mandich H. Jean Alejos NP-C 02/27/2023, 10:46 AM  [employer] [phone]

## 2023-02-27 NOTE — Plan of Care (Signed)

## 2023-02-27 NOTE — Progress Notes (Addendum)
 D/C order noted. Contacted [facility] to be advised of pt's d/c today and that pt should resume care tomorrow.   Olivia Canter Renal Navigator [phone]  Addendum at 1:00 pm: Advised by RN CM that pt does not have transportation to HD tomorrow (see RN CM note). Providers felt it in pt's best interest to receive HD tomorrow then possibly d/c home. Contacted [facility] to be advised that pt will not d/c today and will not be at treatment tomorrow due to the above reason.

## 2023-02-27 NOTE — TOC Progression Note (Signed)
 Transition of Care [facility]) - Progression Note    Patient Details  Name: Nathaniel White MRN: [id number] Date of Birth: February 06, 1960  [facility] of [facility]) CM/SW Contact  Gordy Clement, RN Phone Number: 02/27/2023, 11:12 AM  Clinical Narrative:     RNCM met with patient bedside and called patient's transportation resource, Kim @ [phone].  Ki mstates that she will not be able to get patient to HD in the weather tomorrow and is requesting patient stay here until roads are ok after his next scheduled HD tomorrow. Provider requested RNCM reach out to HD coordinator to get input .   Will follow            Expected Discharge Plan and Services         Expected Discharge Date: 02/27/23                                     Social Determinants of Health (SDOH) Interventions SDOH Screenings   Food Insecurity: No Food Insecurity (02/26/2023)  Housing: Low Risk  (02/26/2023)  Transportation Needs: No Transportation Needs (02/26/2023)  Utilities: Not At Risk (02/26/2023)  Alcohol Screen: Low Risk  (07/18/2022)  Depression (PHQ2-9): Low Risk  (07/18/2022)  Financial Resource Strain: Low Risk  (07/18/2022)  Physical Activity: Inactive (07/18/2022)  Social Connections: Moderately Isolated (07/18/2022)  Stress: No Stress Concern Present (07/18/2022)  Tobacco Use: Medium Risk (02/08/2023)    Readmission Risk Interventions    02/06/2023   12:04 PM 09/15/2021   10:09 AM 09/06/2021    4:20 PM  Readmission Risk Prevention Plan  Transportation Screening Complete Complete Complete  PCP or Specialist Appt within 3-5 Days  Complete Complete  HRI or Home Care Consult  Complete Complete  Social Work Consult for Recovery Care Planning/Counseling  Complete Complete  Palliative Care Screening  Not Applicable Not Applicable  Medication Review Oceanographer) Complete Complete Complete  HRI or Home Care Consult Complete    SW Recovery Care/Counseling Consult Complete    Palliative Care  Screening Not Applicable    Skilled Nursing Facility Not Applicable

## 2023-02-27 NOTE — Progress Notes (Signed)
 Informed by [facility] team that-patient's transportation service that usually takes him to hemodialysis-will not be able to transport him due to weather issues.  Patient presented with respiratory failure/hyperkalemia after an incomplete HD session.  Suspect that if he is not able to get reliable transportation tomorrow for HD due to weather issues then it is not safe for him to be discharged.  Hold discharge-discussed with nephrologist-they will dialyze him tomorrow-hopefully he can be discharged home after HD tomorrow.

## 2023-02-27 NOTE — Progress Notes (Signed)
   02/27/23 0020  BiPAP/CPAP/SIPAP  Reason BIPAP/CPAP not in use Non-compliant

## 2023-02-27 NOTE — Plan of Care (Signed)
  Problem: Education: Goal: Knowledge of General Education information will improve Description: Including pain rating scale, medication(s)/side effects and non-pharmacologic comfort measures Outcome: Progressing   Problem: Clinical Measurements: Goal: Will remain free from infection Outcome: Progressing Goal: Diagnostic test results will improve Outcome: Progressing Goal: Respiratory complications will improve Outcome: Progressing Goal: Cardiovascular complication will be avoided Outcome: Progressing   Problem: Activity: Goal: Risk for activity intolerance will decrease Outcome: Progressing   Problem: Nutrition: Goal: Adequate nutrition will be maintained Outcome: Progressing   Problem: Coping: Goal: Level of anxiety will decrease Outcome: Progressing   Problem: Elimination: Goal: Will not experience complications related to urinary retention Outcome: Progressing   Problem: Pain Managment: Goal: General experience of comfort will improve and/or be controlled Outcome: Progressing   Problem: Safety: Goal: Ability to remain free from injury will improve Outcome: Progressing

## 2023-02-27 NOTE — Discharge Summary (Signed)
 PATIENT DETAILS Name: Nathaniel White Age: 63 y.o. Sex: male Date of Birth: 1960/03/18 MRN: [id number]. Admitting Physician: Lonia Blood, MD [id number], Shea Stakes, NP  Admit Date: 02/25/2023 Discharge date: 02/27/2023  Recommendations for Outpatient Follow-up:  Follow up with PCP in 1-2 weeks Please obtain CMP/CBC in one week  Admitted From:  Home  Disposition: Home   Discharge Condition: good  CODE STATUS:   Code Status: Full Code   Diet recommendation:  Diet Order             Diet - low sodium heart healthy           Diet renal with fluid restriction Fluid restriction: 1200 mL Fluid; Room service appropriate? Yes; Fluid consistency: Thin  Diet effective now                    Brief Summary: 63 year old with history of ESRD on HD TTS, chronic hypoxic respiratory failure on 2-3 L of oxygen at home, chronic anticoagulation, severe MR, PAF, chronic hypotension who had a shortened HD session as an outpatient due to access issues-presented shortness of breath-found to have acute on chronic hypoxic respiratory failure due to volume overload/pulm edema.  He was started on BiPAP-and evaluated by nephrology and underwent hemodialysis.  This morning-he feels much better-he is back on 2 L of oxygen-plan is to discharge home today.  See below for further details.  Brief Hospital Course: Acute exacerbation of chronic hypoxic respiratory failure due to volume overload/pulmonary edema -acute exacerbation of chronic systolic congestive heart failure EF 35-40% on TTE December 2024 Etiology of volume overload was shortened HD as an outpatient due to catheter malfunction. Volume status/hypoxia significantly improved-after HD on 2/18 back on 2-3 L of oxygen this morning. Discussed with Dr. Victoriano Lain for discharge.   ESRD with malfunctioning HD catheter Care per Nephrology -it appears that the clotting issues with the patient's catheter were able to be resolved during  dialysis t   Severe mitral valve regurgitation To follow-up with cardiology/PCP.  Hypocalcemia Calcium 5.7 at presentation - dosed with calcium gluconate in the ER -calcium stable.   Elevated troponin Not markedly elevated given that the patient is ESRD -most consistent with demand ischemia in the setting of severe volume overload-trend is flat. Doubt any further workup required.   QTc prolongation 507 on EKG at presentation -monitoring on telemetry -avoiding medications that may prolong QTc   COPD Well compensated   PAF Continue Eliquis   Chronic hypotension Continue midodrine   Anemia of chronic kidney disease Hemoglobin at baseline   Chronic debility/deconditioning-mostly bed to wheelchair bound-dependent on roommates for all daily activities of living  BMI: Estimated body mass index is 22.14 kg/m as calculated from the following:   Height as of this encounter: 5\' 9"  (1.753 m).   Weight as of this encounter: 68 kg.   Note-Sister-[name] updated regarding discharge plans this morning.  Discharge Diagnoses:  Principal Problem:   Fluid overload Active Problems:   ESRD on dialysis (HCC)   Acute on chronic hypoxic respiratory failure (HCC)   Volume overload   Acute exacerbation of CHF (congestive heart failure) (HCC)   Paroxysmal atrial fibrillation [facility])   Discharge Instructions:  Activity:  As tolerated with Full fall precautions use walker/cane & assistance as needed Discharge Instructions     Call MD for:  difficulty breathing, headache or visual disturbances   Complete by: As directed    Call MD for:  extreme fatigue   Complete by:  As directed    Call MD for:  persistant dizziness or light-headedness   Complete by: As directed    Diet - low sodium heart healthy   Complete by: As directed    Discharge instructions   Complete by: As directed    Follow with Primary MD  Claiborne Rigg, NP in 1-2 weeks  Follow-up with your hemodialysis clinic at your  usual schedule on Tuesdays, Thursdays and Saturdays.  Please get a complete blood count and chemistry panel checked by your Primary MD at your next visit, and again as instructed by your Primary MD.  Get Medicines reviewed and adjusted: Please take all your medications with you for your next visit with your Primary MD  Laboratory/radiological data: Please request your Primary MD to go over all hospital tests and procedure/radiological results at the follow up, please ask your Primary MD to get all Hospital records sent to his/her office.  In some cases, they will be blood work, cultures and biopsy results pending at the time of your discharge. Please request that your primary care M.D. follows up on these results.  Also Note the following: If you experience worsening of your admission symptoms, develop shortness of breath, life threatening emergency, suicidal or homicidal thoughts you must seek medical attention immediately by calling 911 or calling your MD immediately  if symptoms less severe.  You must read complete instructions/literature along with all the possible adverse reactions/side effects for all the Medicines you take and that have been prescribed to you. Take any new Medicines after you have completely understood and accpet all the possible adverse reactions/side effects.   Do not drive when taking Pain medications or sleeping medications (Benzodaizepines)  Do not take more than prescribed Pain, Sleep and Anxiety Medications. It is not advisable to combine anxiety,sleep and pain medications without talking with your primary care practitioner  Special Instructions: If you have smoked or chewed Tobacco  in the last 2 yrs please stop smoking, stop any regular Alcohol  and or any Recreational drug use.  Wear Seat belts while driving.  Please note: You were cared for by a hospitalist during your hospital stay. Once you are discharged, your primary care physician will handle any  further medical issues. Please note that NO REFILLS for any discharge medications will be authorized once you are discharged, as it is imperative that you return to your primary care physician (or establish a relationship with a primary care physician if you do not have one) for your post hospital discharge needs so that they can reassess your need for medications and monitor your lab values.   Increase activity slowly   Complete by: As directed       Allergies as of 02/27/2023       Reactions   Codeine Shortness Of Breath   Dextromethorphan-guaifenesin Shortness Of Breath, Other (See Comments)   Iron Dextran Shortness Of Breath, Other (See Comments)   Pt does not recall 02/05/23   Morphine And Codeine Shortness Of Breath   Other Reaction(s): Other (See Comments)        Medication List     TAKE these medications    acetaminophen 500 MG tablet Commonly known as: TYLENOL Take 1 tablet (500 mg total) by mouth every 6 (six) hours as needed. What changed: reasons to take this   albuterol 108 (90 Base) MCG/ACT inhaler Commonly known as: ProAir HFA Inhale 2 puffs into the lungs every 6 (six) hours as needed for wheezing or shortness of  breath.   Eliquis 5 MG Tabs tablet Generic drug: apixaban Take 5 mg by mouth 2 (two) times daily.   levocetirizine 5 MG tablet Commonly known as: XYZAL Take 5 mg by mouth daily as needed for allergies.   midodrine 5 MG tablet Commonly known as: PROAMATINE Take 5 mg by mouth 3 (three) times daily. Take one tablet by mouth three times daily on non-dialysis days. Monday, Wednesday, Friday, and Sunday.   Velphoro 500 MG chewable tablet Generic drug: sucroferric oxyhydroxide Chew 1,000 mg by mouth 2 (two) times daily after a meal.   Visine 0.05 % ophthalmic solution Generic drug: tetrahydrozoline Place 2-3 drops into both eyes 2 (two) times daily as needed (dry eye).        Follow-up Information     Claiborne Rigg, NP. Schedule an  appointment as soon as possible for a visit in 1 week(s).   Specialty: Nurse Practitioner Contact information: [address] [place] [address] [phone]         Hemodialysis Clinic Follow up.   Why: keep your usual schedule on Tuesday Thursday and Saturday               Allergies  Allergen Reactions   Codeine Shortness Of Breath   Dextromethorphan-Guaifenesin Shortness Of Breath and Other (See Comments)   Iron Dextran Shortness Of Breath and Other (See Comments)    Pt does not recall 02/05/23   Morphine And Codeine Shortness Of Breath    Other Reaction(s): Other (See Comments)     Other Procedures/Studies: DG Chest Portable 1 View Result Date: 02/25/2023 CLINICAL DATA:  Shortness of breath EXAM: PORTABLE CHEST 1 VIEW COMPARISON:  02/04/2023, 01/16/2023, CT 12/27/2022, chest x-ray 09/04/2021, 03/03/2018 FINDINGS: Left-sided central venous catheter tip at the right atrium. Chronic pleural and parenchymal scarring on the right. Cardiomegaly with vascular congestion and probable mild interstitial edema. Suspicion of small left effusion. Similar peripheral bandlike opacities in the left mid to lower lung which could be due to chronic scarring or atelectasis. IMPRESSION: 1. Cardiomegaly with vascular congestion and probable mild interstitial edema. Suspicion of small left effusion. 2. Chronic pleural and parenchymal scarring on the right. Similar peripheral bandlike opacities in the left mid to lower lung which could be due to chronic scarring or atelectasis. Electronically Signed   By: Jasmine Pang M.D.   On: 02/25/2023 22:06   DG Chest 2 View Result Date: 02/04/2023 CLINICAL DATA:  Shortness of breath EXAM: CHEST - 2 VIEW COMPARISON:  Chest x-ray 01/16/2023.  Chest CT 12/27/2022. FINDINGS: Left-sided central venous catheter tip projects over the cavoatrial junction. The heart is enlarged. There central pulmonary vascular congestion and central interstitial  opacities bilaterally. Linear opacities in the right lower lobe appear similar to the prior study. There is a trace right pleural effusion. There is no pneumothorax. No acute fractures are seen. IMPRESSION: 1. Cardiomegaly with central pulmonary vascular congestion and central interstitial opacities bilaterally, likely pulmonary edema. 2. Trace right pleural effusion. 3. Linear opacities in the right lower lobe appear similar to the prior study, likely atelectasis. Electronically Signed   By: Darliss Cheney M.D.   On: 02/04/2023 22:47     TODAY-DAY OF DISCHARGE:  Subjective:   Nathaniel White today has no headache,no chest abdominal pain,no new weakness tingling or numbness, feels much better wants to go home today.   Objective:   Blood pressure 112/71, pulse (!) 56, temperature 98.1 F (36.7 C), temperature source Oral, resp. rate 18, height  5\' 9"  (1.753 m), weight 68 kg, SpO2 100%.  Intake/Output Summary (Last 24 hours) at 02/27/2023 1008 Last data filed at 02/26/2023 1221 Gross per 24 hour  Intake --  Output 2300 ml  Net -2300 ml   Filed Weights   02/26/23 1314  Weight: 68 kg    Exam: Awake Alert, Oriented *3, No new F.N deficits, Normal affect [place].AT,PERRAL Supple Neck,No JVD, No cervical lymphadenopathy appriciated.  Symmetrical Chest wall movement, Good air movement bilaterally, CTAB RRR,No Gallops,Rubs or new Murmurs, No Parasternal Heave +ve B.Sounds, Abd Soft, Non tender, No organomegaly appriciated, No rebound -guarding or rigidity. No Cyanosis, Clubbing or edema, No new Rash or bruise   PERTINENT RADIOLOGIC STUDIES: DG Chest Portable 1 View Result Date: 02/25/2023 CLINICAL DATA:  Shortness of breath EXAM: PORTABLE CHEST 1 VIEW COMPARISON:  02/04/2023, 01/16/2023, CT 12/27/2022, chest x-ray 09/04/2021, 03/03/2018 FINDINGS: Left-sided central venous catheter tip at the right atrium. Chronic pleural and parenchymal scarring on the right. Cardiomegaly with vascular congestion  and probable mild interstitial edema. Suspicion of small left effusion. Similar peripheral bandlike opacities in the left mid to lower lung which could be due to chronic scarring or atelectasis. IMPRESSION: 1. Cardiomegaly with vascular congestion and probable mild interstitial edema. Suspicion of small left effusion. 2. Chronic pleural and parenchymal scarring on the right. Similar peripheral bandlike opacities in the left mid to lower lung which could be due to chronic scarring or atelectasis. Electronically Signed   By: Jasmine Pang M.D.   On: 02/25/2023 22:06     PERTINENT LAB RESULTS: CBC: Recent Labs    02/26/23 0820 02/27/23 0502  WBC 6.0 5.2  HGB 10.4* 11.5*  HCT 35.3* 38.3*  PLT 134* 138*   CMET CMP     Component Value Date/Time   NA 133 (L) 02/27/2023 0502   NA 141 06/07/2021 1621   NA 142 01/17/2015 1454   K 4.8 02/27/2023 0502   K 4.2 01/17/2015 1454   CL 95 (L) 02/27/2023 0502   CO2 23 02/27/2023 0502   CO2 28 01/17/2015 1454   GLUCOSE 109 (H) 02/27/2023 0502   GLUCOSE 96 01/17/2015 1454   BUN 37 (H) 02/27/2023 0502   BUN 44 (H) 06/07/2021 1621   BUN 46.9 (H) 01/17/2015 1454   CREATININE 8.85 (H) 02/27/2023 0502   CREATININE 9.9 (HH) 01/17/2015 1454   CALCIUM 9.3 02/27/2023 0502   CALCIUM 10.2 01/17/2015 1454   PROT 4.5 (L) 02/25/2023 2133   PROT 7.4 06/07/2021 1621   PROT 7.8 01/17/2015 1454   ALBUMIN 2.3 (L) 02/27/2023 0502   ALBUMIN 4.2 06/07/2021 1621   ALBUMIN 3.5 01/17/2015 1454   AST 38 02/25/2023 2133   AST 17 01/17/2015 1454   ALT 9 02/25/2023 2133   ALT 9 01/17/2015 1454   ALKPHOS 38 02/25/2023 2133   ALKPHOS 120 01/17/2015 1454   BILITOT 1.0 02/25/2023 2133   BILITOT 1.0 06/07/2021 1621   BILITOT 0.84 01/17/2015 1454   GFR 13.08 (LL) 10/08/2013 1353   EGFR 6 (L) 06/07/2021 1621   GFRNONAA 6 (L) 02/27/2023 0502    GFR Estimated Creatinine Clearance: 8.5 mL/min (A) (by C-G formula based on SCr of 8.85 mg/dL (H)). No results for  input(s): "LIPASE", "AMYLASE" in the last 72 hours. No results for input(s): "CKTOTAL", "CKMB", "CKMBINDEX", "TROPONINI" in the last 72 hours. Invalid input(s): "POCBNP" No results for input(s): "DDIMER" in the last 72 hours. No results for input(s): "HGBA1C" in the last 72 hours. No results for input(s): "  CHOL", "HDL", "LDLCALC", "TRIG", "CHOLHDL", "LDLDIRECT" in the last 72 hours. No results for input(s): "TSH", "T4TOTAL", "T3FREE", "THYROIDAB" in the last 72 hours.  Invalid input(s): "FREET3" No results for input(s): "VITAMINB12", "FOLATE", "FERRITIN", "TIBC", "IRON", "RETICCTPCT" in the last 72 hours. Coags: No results for input(s): "INR" in the last 72 hours.  Invalid input(s): "PT" Microbiology: No results found for this or any previous visit (from the past 240 hours).  FURTHER DISCHARGE INSTRUCTIONS:  Get Medicines reviewed and adjusted: Please take all your medications with you for your next visit with your Primary MD  Laboratory/radiological data: Please request your Primary MD to go over all hospital tests and procedure/radiological results at the follow up, please ask your Primary MD to get all Hospital records sent to his/her office.  In some cases, they will be blood work, cultures and biopsy results pending at the time of your discharge. Please request that your primary care M.D. goes through all the records of your hospital data and follows up on these results.  Also Note the following: If you experience worsening of your admission symptoms, develop shortness of breath, life threatening emergency, suicidal or homicidal thoughts you must seek medical attention immediately by calling 911 or calling your MD immediately  if symptoms less severe.  You must read complete instructions/literature along with all the possible adverse reactions/side effects for all the Medicines you take and that have been prescribed to you. Take any new Medicines after you have completely  understood and accpet all the possible adverse reactions/side effects.   Do not drive when taking Pain medications or sleeping medications (Benzodaizepines)  Do not take more than prescribed Pain, Sleep and Anxiety Medications. It is not advisable to combine anxiety,sleep and pain medications without talking with your primary care practitioner  Special Instructions: If you have smoked or chewed Tobacco  in the last 2 yrs please stop smoking, stop any regular Alcohol  and or any Recreational drug use.  Wear Seat belts while driving.  Please note: You were cared for by a hospitalist during your hospital stay. Once you are discharged, your primary care physician will handle any further medical issues. Please note that NO REFILLS for any discharge medications will be authorized once you are discharged, as it is imperative that you return to your primary care physician (or establish a relationship with a primary care physician if you do not have one) for your post hospital discharge needs so that they can reassess your need for medications and monitor your lab values.  Total Time spent coordinating discharge including counseling, education and face to face time equals greater than 30 minutes.  Signed: Zenab Gronewold 02/27/2023 10:08 AM

## 2023-02-28 ENCOUNTER — Inpatient Hospital Stay (HOSPITAL_COMMUNITY): Payer: Medicare Other

## 2023-02-28 DIAGNOSIS — N186 End stage renal disease: Secondary | ICD-10-CM

## 2023-02-28 DIAGNOSIS — Z992 Dependence on renal dialysis: Secondary | ICD-10-CM | POA: Diagnosis not present

## 2023-02-28 DIAGNOSIS — I959 Hypotension, unspecified: Secondary | ICD-10-CM

## 2023-02-28 DIAGNOSIS — I480 Paroxysmal atrial fibrillation: Secondary | ICD-10-CM

## 2023-02-28 DIAGNOSIS — J9601 Acute respiratory failure with hypoxia: Secondary | ICD-10-CM | POA: Diagnosis not present

## 2023-02-28 DIAGNOSIS — I469 Cardiac arrest, cause unspecified: Secondary | ICD-10-CM

## 2023-02-28 LAB — POCT I-STAT 7, (LYTES, BLD GAS, ICA,H+H)
Acid-base deficit: 1 mmol/L (ref 0.0–2.0)
Bicarbonate: 22.2 mmol/L (ref 20.0–28.0)
Calcium, Ion: 1.22 mmol/L (ref 1.15–1.40)
HCT: 35 % — ABNORMAL LOW (ref 39.0–52.0)
Hemoglobin: 11.9 g/dL — ABNORMAL LOW (ref 13.0–17.0)
O2 Saturation: 100 %
Patient temperature: 98.6
Potassium: 3.8 mmol/L (ref 3.5–5.1)
Sodium: 133 mmol/L — ABNORMAL LOW (ref 135–145)
TCO2: 23 mmol/L (ref 22–32)
pCO2 arterial: 32.5 mm[Hg] (ref 32–48)
pH, Arterial: 7.442 (ref 7.35–7.45)
pO2, Arterial: 301 mm[Hg] — ABNORMAL HIGH (ref 83–108)

## 2023-02-28 LAB — LACTIC ACID, PLASMA
Lactic Acid, Venous: 6.5 mmol/L (ref 0.5–1.9)
Lactic Acid, Venous: 3.9 mmol/L (ref 0.5–1.9)

## 2023-02-28 LAB — BASIC METABOLIC PANEL
Anion gap: 14 (ref 5–15)
BUN: 29 mg/dL — ABNORMAL HIGH (ref 6–20)
CO2: 20 mmol/L — ABNORMAL LOW (ref 22–32)
Calcium: 9.8 mg/dL (ref 8.9–10.3)
Chloride: 101 mmol/L (ref 98–111)
Creatinine, Ser: 6.56 mg/dL — ABNORMAL HIGH (ref 0.61–1.24)
GFR, Estimated: 9 mL/min — ABNORMAL LOW (ref >60)
Glucose, Bld: 175 mg/dL — ABNORMAL HIGH (ref 70–99)
Potassium: 3.7 mmol/L (ref 3.5–5.1)
Sodium: 135 mmol/L (ref 135–145)

## 2023-02-28 LAB — CBC
HCT: 37.9 % — ABNORMAL LOW (ref 39.0–52.0)
Hemoglobin: 11 g/dL — ABNORMAL LOW (ref 13.0–17.0)
MCH: 25 pg — ABNORMAL LOW (ref 26.0–34.0)
MCHC: 29 g/dL — ABNORMAL LOW (ref 30.0–36.0)
MCV: 86.1 fL (ref 80.0–100.0)
Platelets: 151 10*3/uL (ref 150–400)
RBC: 4.4 MIL/uL (ref 4.22–5.81)
RDW: 18 % — ABNORMAL HIGH (ref 11.5–15.5)
WBC: 10.4 10*3/uL (ref 4.0–10.5)
nRBC: 0 % (ref 0.0–0.2)

## 2023-02-28 LAB — RENAL FUNCTION PANEL
Albumin: 2 g/dL — ABNORMAL LOW (ref 3.5–5.0)
Anion gap: 17 — ABNORMAL HIGH (ref 5–15)
BUN: 27 mg/dL — ABNORMAL HIGH (ref 6–20)
CO2: 17 mmol/L — ABNORMAL LOW (ref 22–32)
Calcium: 10.5 mg/dL — ABNORMAL HIGH (ref 8.9–10.3)
Chloride: 101 mmol/L (ref 98–111)
Creatinine, Ser: 6.15 mg/dL — ABNORMAL HIGH (ref 0.61–1.24)
GFR, Estimated: 10 mL/min — ABNORMAL LOW (ref >60)
Glucose, Bld: 158 mg/dL — ABNORMAL HIGH (ref 70–99)
Phosphorus: 5.5 mg/dL — ABNORMAL HIGH (ref 2.5–4.6)
Potassium: 3.8 mmol/L (ref 3.5–5.1)
Sodium: 135 mmol/L (ref 135–145)

## 2023-02-28 LAB — MRSA NEXT GEN BY PCR, NASAL: MRSA by PCR Next Gen: NOT DETECTED

## 2023-02-28 LAB — TROPONIN I (HIGH SENSITIVITY)
Troponin I (High Sensitivity): 83 ng/L — ABNORMAL HIGH (ref <18)
Troponin I (High Sensitivity): 91 ng/L — ABNORMAL HIGH (ref <18)

## 2023-02-28 LAB — MAGNESIUM
Magnesium: 3.4 mg/dL — ABNORMAL HIGH (ref 1.7–2.4)
Magnesium: 2.8 mg/dL — ABNORMAL HIGH (ref 1.7–2.4)

## 2023-02-28 LAB — GLUCOSE, CAPILLARY
Glucose-Capillary: 152 mg/dL — ABNORMAL HIGH (ref 70–99)
Glucose-Capillary: 119 mg/dL — ABNORMAL HIGH (ref 70–99)

## 2023-02-28 MED ORDER — LORATADINE 10 MG PO TABS: 10.0000 mg | ORAL_TABLET | Freq: Every day | ORAL | Status: DC | PRN

## 2023-02-28 MED ORDER — NOREPINEPHRINE 4 MG/250ML-% IV SOLN
2.0000 ug/min | INTRAVENOUS | Status: DC
  Filled 2023-02-28: qty 250

## 2023-02-28 MED ORDER — ORAL CARE MOUTH RINSE: 15.0000 mL | OROMUCOSAL | Status: DC | PRN

## 2023-02-28 MED ORDER — AMIODARONE HCL IN DEXTROSE 360-4.14 MG/200ML-% IV SOLN: 60.0000 mg/h | INTRAVENOUS | Status: DC

## 2023-02-28 MED ORDER — FAMOTIDINE 20 MG PO TABS: 20.0000 mg | ORAL_TABLET | Freq: Two times a day (BID) | ORAL | Status: DC

## 2023-02-28 MED ORDER — LIDOCAINE IN D5W 4-5 MG/ML-% IV SOLN
0.5000 mg/min | INTRAVENOUS | Status: DC
  Administered 2023-02-28 – 2023-03-01 (×2): 1 mg/min via INTRAVENOUS
  Filled 2023-02-28 (×2): qty 500

## 2023-02-28 MED ORDER — ROCURONIUM BROMIDE 10 MG/ML (PF) SYRINGE
50.0000 mg | PREFILLED_SYRINGE | Freq: Once | INTRAVENOUS | Status: AC
  Administered 2023-02-28: 50 mg via INTRAVENOUS

## 2023-02-28 MED ORDER — ACETAMINOPHEN 160 MG/5ML PO SOLN
650.0000 mg | Freq: Four times a day (QID) | ORAL | Status: DC | PRN
  Administered 2023-03-03: 650 mg
  Filled 2023-02-28: qty 20.3

## 2023-02-28 MED ORDER — APIXABAN 5 MG PO TABS
5.0000 mg | ORAL_TABLET | Freq: Two times a day (BID) | ORAL | Status: DC
  Administered 2023-02-28 – 2023-03-09 (×19): 5 mg
  Filled 2023-02-28 (×19): qty 1

## 2023-02-28 MED ORDER — FENTANYL CITRATE PF 50 MCG/ML IJ SOSY
PREFILLED_SYRINGE | INTRAMUSCULAR | Status: AC
  Administered 2023-02-28: 100 ug via INTRAVENOUS
  Filled 2023-02-28: qty 2

## 2023-02-28 MED ORDER — FENTANYL CITRATE PF 50 MCG/ML IJ SOSY: 50.0000 ug | PREFILLED_SYRINGE | Freq: Once | INTRAMUSCULAR | Status: DC

## 2023-02-28 MED ORDER — ORAL CARE MOUTH RINSE
15.0000 mL | OROMUCOSAL | Status: DC
  Administered 2023-02-28 – 2023-03-09 (×112): 15 mL via OROMUCOSAL

## 2023-02-28 MED ORDER — SODIUM CHLORIDE 0.9 % IV SOLN: 250.0000 mL | INTRAVENOUS | Status: AC

## 2023-02-28 MED ORDER — MIDODRINE HCL 5 MG PO TABS
5.0000 mg | ORAL_TABLET | ORAL | Status: DC
  Administered 2023-03-01 – 2023-03-06 (×9): 5 mg
  Filled 2023-02-28 (×9): qty 1

## 2023-02-28 MED ORDER — FENTANYL BOLUS VIA INFUSION
50.0000 ug | INTRAVENOUS | Status: DC | PRN
  Administered 2023-02-28 (×2): 100 ug via INTRAVENOUS
  Administered 2023-02-28: 50 ug via INTRAVENOUS
  Administered 2023-02-28 (×4): 100 ug via INTRAVENOUS

## 2023-02-28 MED ORDER — FENTANYL CITRATE PF 50 MCG/ML IJ SOSY: 100.0000 ug | PREFILLED_SYRINGE | Freq: Once | INTRAMUSCULAR | Status: AC

## 2023-02-28 MED ORDER — FAMOTIDINE 20 MG PO TABS
20.0000 mg | ORAL_TABLET | Freq: Two times a day (BID) | ORAL | Status: DC
  Administered 2023-02-28 – 2023-03-01 (×3): 20 mg
  Filled 2023-02-28 (×3): qty 1

## 2023-02-28 MED ORDER — ETOMIDATE 2 MG/ML IV SOLN
20.0000 mg | Freq: Once | INTRAVENOUS | Status: AC
  Administered 2023-02-28: 20 mg via INTRAVENOUS

## 2023-02-28 MED ORDER — POLYETHYLENE GLYCOL 3350 17 G PO PACK
17.0000 g | PACK | Freq: Every day | ORAL | Status: DC
  Administered 2023-02-28 – 2023-03-03 (×3): 17 g
  Filled 2023-02-28 (×4): qty 1

## 2023-02-28 MED ORDER — FENTANYL 2500MCG IN NS 250ML (10MCG/ML) PREMIX INFUSION
50.0000 ug/h | INTRAVENOUS | Status: DC
  Administered 2023-02-28: 50 ug/h via INTRAVENOUS
  Administered 2023-02-28 – 2023-03-01 (×2): 200 ug/h via INTRAVENOUS
  Filled 2023-02-28 (×3): qty 250

## 2023-02-28 MED ORDER — DOCUSATE SODIUM 50 MG/5ML PO LIQD
100.0000 mg | Freq: Two times a day (BID) | ORAL | Status: DC
  Administered 2023-02-28 – 2023-03-03 (×6): 100 mg
  Filled 2023-02-28 (×8): qty 10

## 2023-02-28 MED ORDER — OSMOLITE 1.5 CAL PO LIQD
1000.0000 mL | ORAL | Status: DC
  Administered 2023-02-28 – 2023-03-01 (×2): 1000 mL
  Filled 2023-02-28: qty 1000

## 2023-02-28 MED ORDER — MIDAZOLAM HCL 2 MG/2ML IJ SOLN
1.0000 mg | INTRAMUSCULAR | Status: DC | PRN
  Administered 2023-02-28 – 2023-03-07 (×12): 2 mg via INTRAVENOUS
  Filled 2023-02-28 (×14): qty 2

## 2023-02-28 MED ORDER — AMIODARONE HCL IN DEXTROSE 360-4.14 MG/200ML-% IV SOLN: 30.0000 mg/h | INTRAVENOUS | Status: DC

## 2023-02-28 NOTE — Code Documentation (Signed)
  Patient Name: Nathaniel White   MRN: [id number]   Date of Birth/ Sex: Apr 19, 1960 , male      Admission Date: 02/25/2023  Attending Provider: Maretta Bees, MD  Primary Diagnosis: Fluid overload   Indication: Pt was in his usual state of health until this AM, when he was noted to be unresponsive and pulseless. Code blue was subsequently called. At the time of arrival on scene, ACLS protocol had terminated after ROSC was obtained with one defibrillation in patient with wide complex tachycardia.  After ROSC and termination of ACLS, pt deteriorated into Vtach multiple times but without loss of pulse and CPR was not restarted. He was defibrillated four more times upon recurrent deterioration. Amiodarone bolus and infusion was started. He received lidocaine as well. Critical care providers presented to bedside and intubated the patient in preparation for transfer to the unit on 2H. Cardiology on-call fellow was contacted and will evaluate the patient shortly.    Technical Description:  - CPR performance duration:  4 minutes  - Was defibrillation or cardioversion used? Yes   - Was external pacer placed? No  - Was patient intubated pre/post CPR? Yes after CPR   Medications Administered: Y = Yes; Blank = No Amiodarone  y  Atropine    Calcium    Epinephrine  y  Lidocaine  y  Magnesium  y  Norepinephrine  y  Phenylephrine    Sodium bicarbonate    Vasopressin     Post CPR evaluation:  - Final Status - Was patient successfully resuscitated ? Yes - What is current rhythm? Sinus rhythm - What is current hemodynamic status? tenuous  Miscellaneous Information:  - Labs sent, including: CBC, RFP, lactic, troponin, Mg  - Primary team notified?  Yes  - Family Notified? Yes  - Additional notes/ transfer status:      Katheran James, DO  02/28/2023, 3:04 AM

## 2023-02-28 NOTE — Progress Notes (Signed)
 Patient is being transported for dialysis at this time.

## 2023-02-28 NOTE — TOC Progression Note (Signed)
 Transition of Care [facility]) - Progression Note    Patient Details  Name: Nathaniel White MRN: [id number] Date of Birth: [birth year]  [facility] CM/SW Contact  Gordy Clement, RN Phone Number: 02/28/2023, 8:27 AM  Clinical Narrative:     Patient did remain here overnight last pm due to potential weather related transportation issue to [facility] this am   Patient transferred to 2 Heart after cardiac arrest in HD last pm. RNCM has let HD Coordinator know to alert [facility] facility.           Expected Discharge Plan and Services         Expected Discharge Date: 02/27/23                                     Social Determinants of Health (SDOH) Interventions SDOH Screenings   Food Insecurity: No Food Insecurity (02/26/2023)  Housing: Low Risk  (02/26/2023)  Transportation Needs: No Transportation Needs (02/26/2023)  Utilities: Not At Risk (02/26/2023)  Alcohol Screen: Low Risk  (07/18/2022)  Depression (PHQ2-9): Low Risk  (07/18/2022)  Financial Resource Strain: Low Risk  (07/18/2022)  Physical Activity: Inactive (07/18/2022)  Social Connections: Moderately Isolated (07/18/2022)  Stress: No Stress Concern Present (07/18/2022)  Tobacco Use: Medium Risk (02/08/2023)    Readmission Risk Interventions    02/06/2023   12:04 PM 09/15/2021   10:09 AM 09/06/2021    4:20 PM  Readmission Risk Prevention Plan  Transportation Screening Complete Complete Complete  PCP or Specialist Appt within 3-5 Days  Complete Complete  HRI or Home Care Consult  Complete Complete  Social Work Consult for Recovery Care Planning/Counseling  Complete Complete  Palliative Care Screening  Not Applicable Not Applicable  Medication Review Oceanographer) Complete Complete Complete  HRI or Home Care Consult Complete    SW Recovery Care/Counseling Consult Complete    Palliative Care Screening Not Applicable    Skilled Nursing Facility Not Applicable

## 2023-02-28 NOTE — Progress Notes (Signed)
 Assessed bilateral arms with and without [place] for PIV insertion. No appropriate veins found at this time. Recommended central line at this time. Covering RN notified.

## 2023-02-28 NOTE — Progress Notes (Signed)
 I personally saw the patient and performed a substantive portion of this encounter, including a complete performance of at least one of the key components (MDM, Hx and/or Exam), in conjunction with the Advanced Practice Provider.   We were emergently called to bedside after this 63 year old man with ESRD developed V-fib arrest during HD and shock had been delivered with ROSC.  CPR was performed for 4 minutes, epi x 1, amiodarone x 150, lidocaine x 1 had been administered.  On arrival he was on oxygen, in extremis, with accessory muscle use, saying "he could not breathe".  Nonrebreather was added.  He sustained at least 2 more episodes of V. tach requiring defibrillation.  He was then intubated using fentanyl, etomidate and rocuronium and transferred to the ICU.  PMH: Biventricular "end-stage" CHF, EF 30 to 35%, normal coronaries on cardiac cath 06/2022.-Considered not a candidate for advanced therapies and was seen by palliative care. Had developed VT and was on oral amiodarone at some point  -Chronic hypotension on midodrine -Paroxysmal atrial fibrillation on Eliquis.  On exam -postintubation, bilateral ventilated breath sounds, S1-S2 regular, weak pulses, left arm weak with atrophy of forearm and hand, severe scaling and ischemic changes of both feet, no edema, left IJ HD catheter   EKG -IVCD, prolonged QT Chest x-ray shows ET tube in position, chronic asymmetric infiltrate right mid zone which seems to be pleural calcification  Labs show mild hypokalemia 3.7, BUN decreased to 29, mag 2.8, calcium 9.8   Impression/plan VF/VT arrest Underlying nonischemic cardiomyopathy, EKG does not show any evidence of ischemia -Cycle troponins , no indication for repeat cath at this time -Cardiology consulted, advising IV lidocaine instead of amiodarone -Calcium and magnesium given empirically  Cardiogenic shock -wean off Levophed  Acute respiratory failure with hypoxia -unable to obtain good  saturations except on ear Vent settings adjusted Await ABG  My critical care time independent of procedure was 50 minutes  Klaire Court V. Vassie Loll MD

## 2023-02-28 NOTE — Progress Notes (Signed)
   02/28/23 0215  Vitals  BP (!) 57/40  BP Location Right Arm  BP Method Automatic  Patient Position (if appropriate) Lying  Pulse Rate Source Monitor  ECG Heart Rate (!) 145  Resp (!) 75  Oxygen Therapy  O2 Device Non-rebreather Mask  During Treatment Monitoring  Blood Flow Rate (mL/min) 0 mL/min  Arterial Pressure (mmHg) 27.47 mmHg  Venous Pressure (mmHg) -35.75 mmHg  TMP (mmHg) 14.75 mmHg  Ultrafiltration Rate (mL/min) 0 mL/min  Dialysate Flow Rate (mL/min) 300 ml/min  Dialysate Potassium Concentration 2  Dialysate Calcium Concentration 2.5  Duration of HD Treatment -hour(s) 1.53 hour(s)  Cumulative Fluid Removed (mL) per Treatment  441.6  HD Safety Checks Performed Yes  Intra-Hemodialysis Comments See progress note (Due to increase heart and declining Blood Pressure 200 saline administered and blood returned.)  Post Treatment  Dialyzer Clearance Lightly streaked  Liters Processed 36.6  Fluid Removed (mL) 400 mL  Tolerated HD Treatment No (Comment) (code blue)  Post-Hemodialysis Comments  (due to increase heart V-Tac, pt treatment ended and code call)  Hemodialysis Catheter Left Subclavian Double lumen Permanent (Tunneled)  Placement Date/Time: 06/20/21 0851   Placed prior to admission: No  Time Out: Correct patient;Correct site;Correct procedure  Maximum sterile barrier precautions: Hand hygiene;Cap;Mask;Sterile gown;Sterile gloves;Large sterile sheet  Site Prep: (c) Ot...  Site Condition No complications  Blue Lumen Status Flushed  Red Lumen Status Flushed (iv infusion for code team)  Catheter fill volume (Arterial)  (i v infusion)  Dressing Status Clean, Dry, Intact  Interventions Dressing reinforced  Drainage Description None  Dressing Change Due 03/05/23  Post treatment catheter status  (Use for IV infusion/per code team)   0213hrs pt Heart sped up during treatment , saline adminstered, Pt went unresponsive code blue activated. CPR started.0225 Acls  started. Pt responsive with dyspnea. Pt intubated and trans ported to ICU 2H02.

## 2023-02-28 NOTE — Progress Notes (Signed)
 [facility] Progress Note   Subjective: pt coded during last half of HD session overnight. Is in ICU bed now intubated. He got 2 hrs of HD.   Objective Vitals:   02/28/23 0930 02/28/23 0945 02/28/23 1000 02/28/23 1015  BP: 135/61 (!) 132/59 (!) 136/90 (!) 129/52  Pulse: (!) 54 (!) 52 (!) 52 (!) 56  Resp: (!) 22 (!) 22 (!) 22 (!) 22  Temp:      TempSrc:      SpO2: 97% 94% 95% 95%  Weight:      Height:       Physical Exam on vent ,sedated, chronically ill appearing  no jvd  throat ett in place  Chest cta bilat and lat  Cor reg no RG  Abd soft ntnd no ascites   Ext trace bilat ankle edema   Neuro on vent and sedated, not following commands  OP HD: SW TTS  4h  B400   67.5kg   2K bath  TDC   Heparin none - last HD 2/15, post wt 69kg, coming off 0.5- 1.5 kg over - good attendance, rarely stays on past 3.5 hrs - hectorol 3 mcg iv three times per week - parsabiv 7.5mg       CXR 2/17 - IMPRESSION: vascular congestion and probable mild interstitial edema. Small left effusion. 2. Chronic pleural and parenchymal scarring on the right. Similar peripheral bandlike opacities in the left mid to lower lung which could be due to chronic scarring or atelectasis.    Assessment/ Plan: Acute on chronic hypoxic resp failure - required BiPAP in ED initially. Had HD 2/18 w/ 2.3 L off w/ improved SOB and was down to 2 L [place] yesterday.  Cardiac arrest / fib and VT) - occurred this morning while on dialysis. Seen by cardiology as below. Getting IV lidocaine. Per cardiology.  Shock - post arrest, on levo gtt End-stage biventricular (R/ L ) CHF / NICM - EF was 20% --> improved to 35%. No advanced options, seen by Palliative care during 06/2022 admission.  ESRD - on HD TTS. Had 2 hrs HD this am. Next HD Saturday.   Hyperkalemia - resolved Secondary hyperparathyroidism - CCa 10.7 Holding hectorol. Phos high, cont binders Anemia of esrd - hemoglobins are stable, no esa needs.  COPD  - on home O2 2.5- 3 L [place] H/o CVA PAF  Vinson Moselle  MD  [facility] 02/28/2023, 10:53 AM  Recent Labs  Lab 02/27/23 0502 02/28/23 0221 02/28/23 0319  HGB 11.5* 11.0*  --   ALBUMIN 2.3* 2.0*  --   CALCIUM 9.3 10.5* 9.8  PHOS 7.2* 5.5*  --   CREATININE 8.85* 6.15* 6.56*  K 4.8 3.8 3.7    Inpatient medications:  apixaban  5 mg Per Tube BID   Chlorhexidine Gluconate Cloth  6 each Topical Daily   docusate  100 mg Per Tube BID   famotidine  20 mg Per Tube BID   fentaNYL (SUBLIMAZE) injection  50 mcg Intravenous Once   [START ON 03/01/2023] midodrine  5 mg Per Tube 3 times per day on Sunday Monday Wednesday Friday   mouth rinse  15 mL Mouth Rinse Q2H   polyethylene glycol  17 g Per Tube Daily    sodium chloride Stopped (02/28/23 0429)   fentaNYL infusion INTRAVENOUS 200 mcg/hr (02/28/23 1000)   heparin sodium (porcine)     lidocaine 1 mg/min (02/28/23 1000)   norepinephrine (LEVOPHED) Adult infusion 1 mcg/min (02/28/23 1000)   acetaminophen (TYLENOL)  oral liquid 160 mg/5 mL, fentaNYL, heparin sodium (porcine), ipratropium-albuterol, loratadine, midazolam, mouth rinse, prochlorperazine

## 2023-02-28 NOTE — Progress Notes (Signed)
 eLink Physician-Brief Progress Note Patient Name: Nathaniel White DOB: [birth year] MRN: [id number]   Date of Service  02/28/2023  HPI/Events of Note  Patient is a chronic dialysis patient with volume overload who went into a wide-complex tachycardia and lost his pulse while on the regular ward, he had 4 minutes of ACLS protocol with return of ROSC and was intubated by Dr. On the regular ward then transferred to [facility] on the ventilator.  eICU Interventions  New Patient Evaluation.        Nathaniel White 02/28/2023, 3:23 AM

## 2023-02-28 NOTE — Progress Notes (Signed)
 Responded to VAST consult for second assess of vasculature for IV placement. Left arm restricted d/t hemiparesis. Pt able to respond to VAST RN and shook his head that graft in right arm is old and non-working. Right arm assessed with and without ultrasound. ONE vessel found in distal forearm (vessel splits mid forearm) which might be cannulated with a 22G catheter and ultrasound, if needed for emergency medication. Informed ICU RN of findings and instructed her to place STAT consult if access needed for emergent medication. Otherwise, patient will need central line for extended medication administration/vascular access.

## 2023-02-28 NOTE — Consult Note (Signed)
 Cardiology Consultation   Patient ID: Nathaniel White MRN: [id number]; DOB: 1960/05/04  Admit date: 02/25/2023 Date of Consult: 02/28/2023  PCP:  Nathaniel Rigg, NP   [facility] Providers Cardiologist:  Nathaniel Carrow, White  Electrophysiologist:  Nathaniel Prude, White       Patient Profile:   Nathaniel White is a 63 y.o. male with a hx of ESRD on dialysis Tuesday, Thursday, and Saturday, NICMP, biventricular failure/end stage HF,  HFrEF with EF of 30 to 35%, paroxysmal atrial fibrillation, and chronic hypertension  who is being seen 02/28/2023 for the evaluation of VT at the request of Dr Nathaniel White.  History of Present Illness:   Nathaniel White is a 63 y.o. male with a hx of ESRD on dialysis Tuesday, Thursday, and Saturday, NICMP, biventricular failure/end stage HF,  HFrEF with EF of 30 to 35%, paroxysmal atrial fibrillation, and chronic hypertension  who is being seen 02/28/2023 for the evaluation of VT   Per report- pt unresponsive and pulseless. Code blue was subsequently called, initial rhythm was reported VF s/p shock and then CPR with 1 epi, but was noted in VT s/p several shocks. IV amiodarone 300 and IV lidocaine 100 was given, ROSC achieved. Pt was intubated by ICU team and transferred to 2h.  Patient is here for ac on chronic resp failure needing BIPAP, ESRD on HD- needing dialyssis. Reported hyperkalemia but likely lab errorr  He has h/o End stage HF (Biventricular failure ) EF 20%-> imprved to 35%. He had long admission 06/2022- not a candidate for adv therapies per eval and had seen palliative care. Was on PO amiodarone for VT. He has not seen Cards since 6/24 EKG and strips reviewed- consistent with VT Repeat EKG after ROSC- NSR,IVCD, QT prolongation.   Prior ECHO:  ECHO: 12/2022 IMPRESSIONS     1. Left ventricular ejection fraction, by estimation, is 35 to 40%. The  left ventricle has moderately decreased function. The left ventricle  demonstrates  global hypokinesis. The left ventricular internal cavity size  was mildly dilated. There is mild  left ventricular hypertrophy. Left ventricular diastolic parameters are  indeterminate.   2. Right ventricular systolic function is moderately reduced. The right  ventricular size is mildly enlarged. There is severely elevated pulmonary  artery systolic pressure. The estimated right ventricular systolic  pressure is 81.6 mmHg.   3. Left atrial size was severely dilated.   4. Right atrial size was mildly dilated.   5. The mitral valve is degenerative. Severe mitral valve regurgitation.  Mild mitral stenosis. The mean mitral valve gradient is 4.5 mmHg with  average heart rate of 63 bpm. Severe mitral annular calcification.   6. The aortic valve was not well visualized. There is mild calcification  of the aortic valve. Aortic valve regurgitation is not visualized. No  aortic stenosis is present.   7. The inferior vena cava is dilated in size with <50% respiratory  variability, suggesting right atrial pressure of 15 mmHg.   ECHO: 6/24 06/19/22 Limited TTE   IMPRESSIONS     1. Left ventricular ejection fraction, by estimation, is 25 to 30%. The  left ventricle has severely decreased function. The left ventricle has no  regional wall motion abnormalities. Left ventricular diastolic function  could not be evaluated.   2. Right ventricular systolic function is severely reduced. The right  ventricular size is moderately enlarged.   3. Left atrial size was severely dilated.   4. Right atrial size was  mildly dilated.   5. The mitral valve is abnormal. Moderate to severe mitral valve  regurgitation due to poor leaflet coaptation and a restricted posterior  leaflet. By quantitative measures, MR is severe with an EROA of 0.54cm2  and regurgitant volume of 72ml.   6. The aortic valve is normal in structure. Aortic valve regurgitation is  not visualized. No aortic stenosis is present.   7. The  inferior vena cava is normal in size with greater than 50%  respiratory variability, suggesting right atrial pressure of 3 mmHg.   LHC/RHC 06/21/2022     LV end diastolic pressure is mildly elevated.   Patient is on Eliquis with A-fib history.   Nonischemic dilated cardiomyopathy with essentially normal coronary arteries.   Severely elevated right heart pressures with RV pressure 81/10, and mean pulmonary artery pressure at 46.   Elevated SVR and PVR  Past Medical History:  Diagnosis Date   Anemia    Anxiety    Asthma    Complication from renal dialysis device 11/02/2013   Complication of anesthesia 2017   according to pt and spouse pt was moving around and cough while under and pt had difficulty waking up so the anesthesia had to be reversed. and pt admitted to ICU.    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/2008   Qualifier: Diagnosis of  By: Nathaniel White Nathaniel White    ESRD    on HD, T-TH-Sat - [place]   Hemiparesis due to old stroke [facility])    left   HFrEF (heart failure with reduced ejection fraction) (HCC)    NICM // TTE 06/2022: EF 25-30, sever RV dysfunction, mod to severe MR   Hypertension    Hx, not current problems, no meds   LV dysfunction    EF 25-30% by echo 07/2011   Memory loss due to medical condition    due to stroke   MR (mitral regurgitation)    moderate to severe, echo 07/2011   Nausea with vomiting, unspecified 06/15/2021   Paroxysmal atrial fibrillation (HCC)    Peripheral vascular disease (HCC)    Shortness of breath    with exertion   Stroke (HCC)    TIA's-left sided weakness   Tobacco abuse     Past Surgical History:  Procedure Laterality Date   ABDOMINAL AORTOGRAM W/LOWER EXTREMITY Bilateral 07/21/2018   Procedure:  ABDOMINAL AORTOGRAM W/LOWER EXTREMITY;  Surgeon: Nathaniel Harman, White;  Location: [facility] INVASIVE CV LAB;  Service: Cardiovascular;  Laterality: Bilateral;   AMPUTATION Left 11/05/2018   Procedure: AMPUTATION SECOND TOE LEFT FOOT;  Surgeon: Nathaniel Harman, White;  Location: [facility] OR;  Service: Vascular;  Laterality: Left;   AMPUTATION Left 02/13/2021   Procedure: Left small finger AMPUTATION DIGIT;  Surgeon: Marlyne Beards, White;  Location: [facility] OR;  Service: Orthopedics;  Laterality: Left;   AV FISTULA PLACEMENT     CARDIAC CATHETERIZATION      medical   COLONOSCOPY W/ BIOPSIES AND POLYPECTOMY     FISTULA SUPERFICIALIZATION Left 11/10/2013   Procedure: FISTULA PLICATION;  Surgeon: Nada Libman, White;  Location: [facility] OR;  Service: Vascular;  Laterality: Left;   INSERTION OF DIALYSIS CATHETER N/A 06/20/2021   Procedure: INSERTION OF DIALYSIS CATHETER;  Surgeon: Victorino Sparrow,  White;  Location: [facility] OR;  Service: Vascular;  Laterality: N/A;   KIDNEY TRANSPLANT     2011 rejected kidney 2012 back on dialysis   LEFT AND RIGHT HEART CATHETERIZATION WITH CORONARY ANGIOGRAM N/A 10/09/2013   Procedure: LEFT AND RIGHT HEART CATHETERIZATION WITH CORONARY ANGIOGRAM;  Surgeon: Kathleene Hazel, White;  Location: [facility] CATH LAB;  Service: Cardiovascular;  Laterality: N/A;   LIGATION OF ARTERIOVENOUS  FISTULA Left 06/20/2021   Procedure: LIGATION OF ARTERIOVENOUS  FISTULA;  Surgeon: Victorino Sparrow, White;  Location: [facility] OR;  Service: Vascular;  Laterality: Left;   PERIPHERAL VASCULAR INTERVENTION Left 07/21/2018   Procedure: PERIPHERAL VASCULAR INTERVENTION;  Surgeon: Nathaniel Harman, White;  Location: [facility] INVASIVE CV LAB;  Service: Cardiovascular;  Laterality: Left;  Popliteal   REVISON OF ARTERIOVENOUS FISTULA Left 08/26/2013   Procedure: EXCISION OF ERODED SKIN AND EXPLORATION OF MAIN LEFT UPPER ARM AV FISTULA;  Surgeon: Larina Earthly, White;  Location: [facility] OR;  Service: Vascular;  Laterality: Left;    REVISON OF ARTERIOVENOUS FISTULA Left 02/05/2014   Procedure: REPAIR OF ARTERIOVENOUS FISTULA ANEURYSM;  Surgeon: Nada Libman, White;  Location: [facility] OR;  Service: Vascular;  Laterality: Left;   REVISON OF ARTERIOVENOUS FISTULA Left 03/10/2021   Procedure: LEFT ARM REVISION OF ARTERIOVENOUS FISTULA WITH PLICATION;  Surgeon: Nathaniel Harman, White;  Location: [facility] OR;  Service: Vascular;  Laterality: Left;   RIGHT/LEFT HEART CATH AND CORONARY ANGIOGRAPHY N/A 06/21/2022   Procedure: RIGHT/LEFT HEART CATH AND CORONARY ANGIOGRAPHY;  Surgeon: Lennette Bihari, White;  Location: [facility] INVASIVE CV LAB;  Service: Cardiovascular;  Laterality: N/A;   SCROTAL EXPLORATION N/A 09/16/2022   Procedure: PENILE DEBRIDMENT;  Surgeon: Rene Paci, White;  Location: [facility] OR;  Service: Urology;  Laterality: N/A;   SHUNTOGRAM N/A 11/05/2012   Procedure: Fistulogram;  Surgeon: Nada Libman, White;  Location: [facility] CATH LAB;  Service: Cardiovascular;  Laterality: N/A;   TEE WITHOUT CARDIOVERSION  08/17/2011   Procedure: TRANSESOPHAGEAL ECHOCARDIOGRAM (TEE);  Surgeon: Laurey Morale, White;  Location: [facility] ENDOSCOPY;  Service: Cardiovascular;  Laterality: N/A;   ULTRASOUND GUIDANCE FOR VASCULAR ACCESS  06/20/2021   Procedure: ULTRASOUND GUIDANCE FOR VASCULAR ACCESS;  Surgeon: Victorino Sparrow, White;  Location: [facility] OR;  Service: Vascular;;   VIDEO ASSISTED THORACOSCOPY (VATS)/DECORTICATION  08/10/11     Home Medications:  Prior to Admission medications   Medication Sig Start Date End Date Taking? Authorizing Provider  acetaminophen (TYLENOL) 500 MG tablet Take 1 tablet (500 mg total) by mouth every 6 (six) hours as needed. Patient taking differently: Take 500 mg by mouth every 6 (six) hours as needed for mild pain (pain score 1-3). 04/11/18  Yes Burky, Barron Alvine, NP  albuterol (PROAIR HFA) 108 (90 Base) MCG/ACT inhaler Inhale 2 puffs into the lungs every 6 (six) hours as needed for wheezing or shortness of breath. 11/30/22  Yes Rai,  Ripudeep K, White  ELIQUIS 5 MG TABS tablet Take 5 mg by mouth 2 (two) times daily. 12/09/21  Yes Provider, Historical, White  levocetirizine (XYZAL) 5 MG tablet Take 5 mg by mouth daily as needed for allergies.   Yes Provider, Historical, White  midodrine (PROAMATINE) 5 MG tablet Take 5 mg by mouth 3 (three) times daily. Take one tablet by mouth three times daily on non-dialysis days. Monday, Wednesday, Friday, and Sunday. 01/21/23  Yes Provider, Historical, White  VELPHORO 500 MG chewable tablet Chew 1,000 mg by mouth 2 (two) times daily after a meal. 02/06/21  Yes Provider, Historical, White  VISINE 0.05 % ophthalmic solution Place 2-3 drops into both eyes 2 (two) times daily as needed (dry eye). 07/04/21  Yes Provider, Historical, White    Inpatient Medications: Scheduled Meds:  apixaban  5 mg Oral BID   Chlorhexidine Gluconate Cloth  6 each Topical Daily   docusate  100 mg Per Tube BID   famotidine  20 mg Per Tube BID   fentaNYL (SUBLIMAZE) injection  50 mcg Intravenous Once   midodrine  5 mg Oral 3 times per day on Sunday Monday Wednesday Friday   mouth rinse  15 mL Mouth Rinse Q2H   polyethylene glycol  17 g Per Tube Daily   rocuronium  50 mg Intravenous Once   sucroferric oxyhydroxide  1,000 mg Oral BID PC   Continuous Infusions:  sodium chloride     fentaNYL infusion INTRAVENOUS     heparin sodium (porcine)     lidocaine 1 mg/min (02/28/23 0342)   norepinephrine (LEVOPHED) Adult infusion     PRN Meds: acetaminophen, fentaNYL, heparin sodium (porcine), ipratropium-albuterol, loratadine, mouth rinse, prochlorperazine  Allergies:    Allergies  Allergen Reactions   Codeine Shortness Of Breath   Dextromethorphan-Guaifenesin Shortness Of Breath and Other (See Comments)   Iron Dextran Shortness Of Breath and Other (See Comments)    Pt does not recall 02/05/23   Morphine And Codeine Shortness Of Breath    Other Reaction(s): Other (See Comments)    Social History:   Social History    Socioeconomic History   Marital status: Single    Spouse name: Not on file   Number of children: 1   Years of education: Not on file   Highest education level: Not on file  Occupational History   Occupation: Nurse, adult and Public librarian: DISABLED  Tobacco Use   Smoking status: Former    Current packs/day: 0.00    Average packs/day: 0.5 packs/day for 15.0 years (7.5 ttl pk-yrs)    Types: Cigarettes    Start date: 1999    Quit date: 2014    Years since quitting: 11.1   Smokeless tobacco: Never  Vaping Use   Vaping status: Never Used  Substance and Sexual Activity   Alcohol use: No    Alcohol/week: 0.0 standard drinks of alcohol   Drug use: No   Sexual activity: Not Currently  Other Topics Concern   Not on file  Social History Narrative   Not on file   Social Drivers of Health   Financial Resource Strain: Low Risk  (07/18/2022)   Overall Financial Resource Strain (CARDIA)    Difficulty of Paying Living Expenses: Not hard at all  Food Insecurity: No Food Insecurity (02/26/2023)   Hunger Vital Sign    Worried About Running Out of Food in the Last Year: Never true    Ran Out of Food in the Last Year: Never true  Transportation Needs: No Transportation Needs (02/26/2023)   PRAPARE - Administrator, Civil Service (Medical): No    Lack of Transportation (Non-Medical): No  Physical Activity: Inactive (07/18/2022)   Exercise Vital Sign    Days of Exercise per Week: 0 days    Minutes of Exercise per Session: 0 min  Stress: No Stress Concern Present (07/18/2022)   [employer] of Occupational Health - Occupational Stress Questionnaire    Feeling of Stress : Only a little  Social Connections: Moderately Isolated (07/18/2022)   Social Connection and Isolation Panel [NHANES]  Frequency of Communication with Friends and Family: More than three times a week    Frequency of Social Gatherings with Friends and Family: Three times a week    Attends Religious  Services: 1 to 4 times per year    Active Member of Clubs or Organizations: No    Attends Banker Meetings: Never    Marital Status: Never married  Intimate Partner Violence: Not At Risk (02/26/2023)   Humiliation, Afraid, Rape, and Kick questionnaire    Fear of Current or Ex-Partner: No    Emotionally Abused: No    Physically Abused: No    Sexually Abused: No    Family History:    Family History  Problem Relation Age of Onset   Hypertension Mother    Varicose Veins Mother    Diabetes Paternal Grandmother    Cancer Paternal Grandfather    CAD Paternal Uncle      ROS:  Please see the history of present illness.   All other ROS reviewed and negative.     Physical Exam/Data:   Vitals:   02/28/23 0030 02/28/23 0100 02/28/23 0130 02/28/23 0319  BP: 119/64 117/67 94/67   Pulse: (!) 57 61 88   Resp: 17 16 19    Temp: 98.8 F (37.1 C)     TempSrc: Oral     SpO2: 96% 94% 92% 96%  Weight:      Height:        Intake/Output Summary (Last 24 hours) at 02/28/2023 0347 Last data filed at 02/27/2023 1500 Gross per 24 hour  Intake 240 ml  Output --  Net 240 ml      02/26/2023    1:14 PM 02/26/2023   12:39 PM 02/26/2023    8:09 AM  Last 3 Weights  Weight (lbs) 149 lb 14.6 Nathaniel -- --  Weight (kg) 68 kg -- --     Body mass index is 22.14 kg/m.  General:  Well nourished, well developed, unresponsive and pulseless. Code blue was subsequently called  HEENT: normal Neck: no JVD Vascular: No carotid bruits; Distal pulses 2+ bilaterally Cardiac:  normal S1, S2; RRR; holosystolic murmuir Lungs:  clear to auscultation bilaterally, no wheezing, rhonchi or rales  Abd: soft, nontender, no hepatomegaly  Ext: no edema Musculoskeletal:  No deformities, BUE and BLE strength normal and equal Skin: warm and dry  Neuro: unresponsive and pulseless. Code blue was subsequently called   Telemetry:  Telemetry was personally reviewed and demonstrates:  NSR  Relevant CV Studies: As  bnoted above  Laboratory Data:  High Sensitivity Troponin:   Recent Labs  Lab 02/04/23 2353 02/25/23 2133 02/25/23 2331 02/26/23 0641 02/28/23 0221  TROPONINIHS 120* 56* 84* 96* 83*     Chemistry Recent Labs  Lab 02/26/23 0641 02/27/23 0502 02/28/23 0221  NA 138 133* 135  K 5.8* 4.8 3.8  CL 99 95* 101  CO2 23 23 17*  GLUCOSE 76 109* 158*  BUN 69* 37* 27*  CREATININE 13.51* 8.85* 6.15*  CALCIUM 9.2 9.3 10.5*  MG 2.3  --  3.4*  GFRNONAA 4* 6* 10*  ANIONGAP 16* 15 17*    Recent Labs  Lab 02/25/23 2133 02/26/23 0641 02/27/23 0502 02/28/23 0221  PROT 4.5*  --   --   --   ALBUMIN <1.5* 2.3* 2.3* 2.0*  AST 38  --   --   --   ALT 9  --   --   --   ALKPHOS 38  --   --   --  BILITOT 1.0  --   --   --    Lipids No results for input(s): "CHOL", "TRIG", "HDL", "LABVLDL", "LDLCALC", "CHOLHDL" in the last 168 hours.  Hematology Recent Labs  Lab 02/26/23 0820 02/27/23 0502 02/28/23 0221  WBC 6.0 5.2 10.4  RBC 4.27 4.61 4.40  HGB 10.4* 11.5* 11.0*  HCT 35.3* 38.3* 37.9*  MCV 82.7 83.1 86.1  MCH 24.4* 24.9* 25.0*  MCHC 29.5* 30.0 29.0*  RDW 18.1* 18.2* 18.0*  PLT 134* 138* 151   Thyroid No results for input(s): "TSH", "FREET4" in the last 168 hours.  BNP Recent Labs  Lab 02/25/23 2133  BNP 2,467.6*    DDimer No results for input(s): "DDIMER" in the last 168 hours.   Radiology/Studies:  Portable Chest x-ray Result Date: 02/28/2023 CLINICAL DATA:  Endotracheal tube placement EXAM: PORTABLE CHEST 1 VIEW COMPARISON:  02/25/2023 FINDINGS: Interval endotracheal tube placement with its tip 3.1 cm above the carina. Left internal jugular hemodialysis catheter is unchanged with its tip within the right atrium. Pulmonary insufflation is stable. Asymmetric mid lung zone interstitial and alveolar pulmonary infiltrate appears stable possibly representing asymmetric pulmonary edema superimposed upon fibrotic interstitial lung disease, but not well characterized on this  examination. No pneumothorax. Small right pleural effusion is stable. Cardiac size is mildly enlarged, unchanged. No acute bone abnormality. Advanced vascular calcifications noted within the axilla bilaterally. IMPRESSION: 1. Endotracheal tube tip 3.1 cm above the carina. 2. Stable asymmetric mid lung zone interstitial and alveolar pulmonary infiltrate possibly representing asymmetric pulmonary edema superimposed upon fibrotic interstitial lung disease, but not well characterized on this examination. 3. Stable small right pleural effusion. Electronically Signed   By: Helyn Numbers M.D.   On: 02/28/2023 03:35   DG Chest Portable 1 View Result Date: 02/25/2023 CLINICAL DATA:  Shortness of breath EXAM: PORTABLE CHEST 1 VIEW COMPARISON:  02/04/2023, 01/16/2023, CT 12/27/2022, chest x-ray 09/04/2021, 03/03/2018 FINDINGS: Left-sided central venous catheter tip at the right atrium. Chronic pleural and parenchymal scarring on the right. Cardiomegaly with vascular congestion and probable mild interstitial edema. Suspicion of small left effusion. Similar peripheral bandlike opacities in the left mid to lower lung which could be due to chronic scarring or atelectasis. IMPRESSION: 1. Cardiomegaly with vascular congestion and probable mild interstitial edema. Suspicion of small left effusion. 2. Chronic pleural and parenchymal scarring on the right. Similar peripheral bandlike opacities in the left mid to lower lung which could be due to chronic scarring or atelectasis. Electronically Signed   By: Jasmine Pang M.D.   On: 02/25/2023 22:06     Assessment and Plan:   Cardiac arrest (Initial reported rhythm was Vfib but had multiple runs of VT s/p Shock) H/o WCT back in 06/2022- was on PO Amiod H/o NICMP, EF 20%-> improved to 35% . Biventricular failure (but End stage HF with no advanced options)- Previously seen by Palliative care Valvular heart dx: Mod-severe MR (secondary) ESRD on HD Chronic Comorbidities: ?h/o non  compliance, COPD on 2.5lts oxygen, chronic hypotension on midodrine, PVD, h/o TIA, Anemia   Plan: -- repeat EKG shows NSR, QT prolongation ~>571ms, IVCD. NO St elevation. Pt rhythm strip noted mainly for VT (So I'm not sure of Vfib but he was shocked several times).  S/p IV amiod 300mg  and IV lidocaine 100mg   - Recommend  continue IV lidocaine 1mg /min for now then titrate down to 0.5mg /min (Given ESRD). - avoid QT prolongation drugs - Pt has h/o End stage HF with no advanced options per prior eval in  06/2022. He mainly went into VT (likely from his advanced cardiomyopathy). Continue antiarrhythmic therapy. No indication for repeat cath. LHC 6/24- essentially normal coronaries. HD- per Nephro - vent mx per ICU team - Norepi is being weaned off.   - goals of care discussion. Cardiology will follow    Risk Assessment/Risk Scores:        New York Heart Association (NYHA) Functional Class NYHA Class IV  CHA2DS2-VASc Score = 6   This indicates a 9.7% annual risk of stroke. The patient's score is based upon: CHF History: 1 HTN History: 1 Diabetes History: 1 Stroke History: 2 Vascular Disease History: 1 Age Score: 0 Gender Score: 0         For questions or updates, please contact [facility] Please consult [email or web address] for contact info under    Signed, Elmon Kirschner, White  02/28/2023 3:47 AM

## 2023-02-28 NOTE — Consult Note (Signed)
 NAME:  Nathaniel White, MRN:  [id number], DOB:  07-12-1960, LOS: 2 ADMISSION DATE:  02/25/2023 CONSULTATION DATE:  02/28/2023 REFERRING MD:  Jerral Ralph - TRH,  CHIEF COMPLAINT:  Cardiac arrest   History of Present Illness:  63 year old man who presented to [facility] 2/17 for SOB after truncated HD session (last 2/15). PMHx significant for HTN, HLD, CVA (with L-sided deficits), PVD, PAF (on Eliquis), HFrEF with NICM, severe MR, COPD (on 2-3L HOT), T2DM, ESRD on HD TTS. Recent admission 1/27-1/29 for volume overload/pulmonary edema.   Patient was reportedly slated for discharge 2/19 but remained in-house due to weather conditions and concern he would not be able to to have transport to HD 2/20; HD was completed overnight 2/19-2/20. While in HD 2/20AM, Code Blue was called for VF arrest. CPR x 4 minutes with Epi x 1, amio 150mg  and 300mg , lidocaine x 1, Mg/Ca repletion. ROSC was achieved and patient was awake and alert, repeatedly saying "help me" and "I can't breathe". NRB was attempted with ongoing extremis. On two additional occasions, patient rhythm deteriorated into monomorphic VT and he was shocked x 2. Decision was made to intubate.  PCCM consulted for ICU transfer and management.  Pertinent Medical History:   Past Medical History:  Diagnosis Date   Anemia    Anxiety    Asthma    Complication from renal dialysis device 11/02/2013   Complication of anesthesia 2017   according to pt and spouse pt was moving around and cough while under and pt had difficulty waking up so the anesthesia had to be reversed. and pt admitted to ICU.    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/2008   Qualifier: Diagnosis of  By: Russella Dar MD Marylu Lund    ESRD    on HD, T-TH-Sat - [place]    Hemiparesis due to old stroke [facility])    left   HFrEF (heart failure with reduced ejection fraction) (HCC)    NICM // TTE 06/2022: EF 25-30, sever RV dysfunction, mod to severe MR   Hypertension    Hx, not current problems, no meds   LV dysfunction    EF 25-30% by echo 07/2011   Memory loss due to medical condition    due to stroke   MR (mitral regurgitation)    moderate to severe, echo 07/2011   Nausea with vomiting, unspecified 06/15/2021   Paroxysmal atrial fibrillation (HCC)    Peripheral vascular disease (HCC)    Shortness of breath    with exertion   Stroke [facility])    TIA's-left sided weakness   Tobacco abuse     Significant Hospital Events: Including procedures, antibiotic start and stop dates in addition to other pertinent events   2/20 - Cardiac arrest in HD, initial rhythm VF with CPR x 2 rounds, Epi/Mg/Ca, shocked x 2; after ROSC, monomorphic VT requiring shock x 2. Intubated at bedside in dialysis. Transferred to [facility] CVICU. Cardiology consulted.  Interim History / Subjective:  PCCM consulted in the setting of Code Blue and need for ICU transfer.  Objective:  Blood pressure 94/67, pulse 88, temperature 98.8 F (37.1 C), temperature source Oral, resp. rate 19, height 5\' 9"  (1.753 m), weight 68 kg, SpO2 96%.    Vent Mode: PRVC FiO2 (%):  [  100 %] 100 % Set Rate:  [22 bmp] 22 bmp PEEP:  [5 cmH20] 5 cmH20 Plateau Pressure:  [32 cmH20] 32 cmH20   Intake/Output Summary (Last 24 hours) at 02/28/2023 0346 Last data filed at 02/27/2023 1500 Gross per 24 hour  Intake 240 ml  Output --  Net 240 ml   Filed Weights   02/26/23 1314  Weight: 68 kg   Physical Examination: General: Acute-on-chronically ill-appearing middle-aged man in NAD (post-intubation). Prior to intubation, patient post-arrest repeatedly saying "help me, I can't breathe". HEENT: [place]/AT, anicteric sclera, PERRL 3mm, moist mucous membranes. Neuro:  Intubated, sedated.  Responds to verbal stimuli. Following  commands intermittently. Baseline L-sided defitis. +Corneal, +Cough, and +Gag  CV: Irregular rhythm with ectopy and brief runs of VT, no m/g/r. PULM: Breathing even and unlabored on vent (PEEP 5, FiO2 100%). Lung fields with faint, diffuse wheezing. GI: Soft, nontender, mildly distended. Normoactive bowel sounds. Extremities: Trace symmetric BLE edema noted. L fifth digit amputation with contractures of L hand. Skin: Warm/dry, no rashes. Extremely dry and scaling skin of bilateral feet.  Resolved Hospital Problem List:    Assessment & Plan:  Post-VT/VF cardiac arrest, in-hospital Cardiogenic shock, improving - Transferred to ICU for higher level of care - Targeted temperature management/normothermia protocol; hold as patient was awake and following commands even peri-intubation - Goal MAP > 65 - Fluid resuscitation as tolerated, caution in the setting of ESRD - Levophed titrated to goal MAP, coming down steadily - Trend troponin - F/u Echo  History of VT HFrEF with NICM, Echo with EF 30-35% Paroxysmal Afib - Cardiology consulted, appreciate recommendations - S/p amio bolus x 2, amio gtt - Transition to lidocaine gtt - Cardiac monitoring - Optimize electrolytes for K > 4, Mg > 2 - Eliquis transitioned to heparin gtt for [facility]  Acute hypoxemic respiratory failure - Continue full vent support (4-8cc/kg IBW) - Wean FiO2 for O2 sat > 90% - Daily WUA/SBT - VAP bundle - Pulmonary hygiene - PAD protocol for sedation: Fentanyl for goal RASS 0 to -1  ESRD on HD - Trend BMP - Replete electrolytes as indicated - Monitor I&Os - Avoid nephrotoxic agents as able - Ensure adequate renal perfusion - Current access LIJ TDC  T2DM - SSI - CBGs Q4H - Goal CBG 140-180  Best Practice: (right click and "Reselect all SmartList Selections" daily)   Diet/type: NPO DVT prophylaxis: SCDs, heparin gtt GI prophylaxis: H2B Lines: Dialysis Catheter Foley:  N/A Code Status:  full code Last  date of multidisciplinary goals of care discussion [Pending]  Labs:  CBC: Recent Labs  Lab 02/25/23 2133 02/25/23 2226 02/26/23 0820 02/27/23 0502 02/28/23 0221  WBC 6.9  --  6.0 5.2 10.4  NEUTROABS 4.3  --   --   --   --   HGB 11.7* 12.9* 10.4* 11.5* 11.0*  HCT 39.0 38.0* 35.3* 38.3* 37.9*  MCV 83.2  --  82.7 83.1 86.1  PLT 128*  --  134* 138* 151   Basic Metabolic Panel: Recent Labs  Lab 02/25/23 2133 02/25/23 2226 02/26/23 0641 02/27/23 0502 02/28/23 0221  NA 142 129* 138 133* 135  K 4.8 >8.5* 5.8* 4.8 3.8  CL 117*  --  99 95* 101  CO2 15*  --  23 23 17*  GLUCOSE 77  --  76 109* 158*  BUN 47*  --  69* 37* 27*  CREATININE 8.29*  --  13.51* 8.85* 6.15*  CALCIUM 5.7*  --  9.2 9.3 10.5*  MG  --   --  2.3  --  3.4*  PHOS  --   --  9.9* 7.2* 5.5*   GFR: Estimated Creatinine Clearance: 12.3 mL/min (A) (by C-G formula based on SCr of 6.15 mg/dL (H)). Recent Labs  Lab 02/25/23 2133 02/26/23 0820 02/27/23 0502 02/28/23 0221 02/28/23 0237  WBC 6.9 6.0 5.2 10.4  --   LATICACIDVEN  --   --   --   --  6.5*   Liver Function Tests: Recent Labs  Lab 02/25/23 2133 02/26/23 0641 02/27/23 0502 02/28/23 0221  AST 38  --   --   --   ALT 9  --   --   --   ALKPHOS 38  --   --   --   BILITOT 1.0  --   --   --   PROT 4.5*  --   --   --   ALBUMIN <1.5* 2.3* 2.3* 2.0*   No results for input(s): "LIPASE", "AMYLASE" in the last 168 hours. No results for input(s): "AMMONIA" in the last 168 hours.  ABG:    Component Value Date/Time   PHART 7.357 01/15/2023 0923   PCO2ART 42.8 01/15/2023 0923   PO2ART 172 (H) 01/15/2023 0923   HCO3 24.8 02/25/2023 2226   TCO2 26 02/25/2023 2226   ACIDBASEDEF 1.0 02/25/2023 2226   O2SAT 81 02/25/2023 2226    Coagulation Profile: No results for input(s): "INR", "PROTIME" in the last 168 hours.  Cardiac Enzymes: No results for input(s): "CKTOTAL", "CKMB", "CKMBINDEX", "TROPONINI" in the last 168 hours.  HbA1C: Hgb A1c MFr Bld   Date/Time Value Ref Range Status  11/29/2022 12:21 PM 4.8 4.8 - 5.6 % Final    Comment:    (NOTE) Pre diabetes:          5.7%-6.4%  Diabetes:              >6.4%  Glycemic control for   <7.0% adults with diabetes   09/05/2021 08:17 AM 5.2 4.8 - 5.6 % Final    Comment:    (NOTE) Pre diabetes:          5.7%-6.4%  Diabetes:              >6.4%  Glycemic control for   <7.0% adults with diabetes    CBG: Recent Labs  Lab 02/28/23 0342  GLUCAP 152*   Review of Systems:   Patient is encephalopathic and/or intubated; therefore, history has been obtained from chart review.   Past Medical History:  He,  has a past medical history of Anemia, Anxiety, Asthma, Complication from renal dialysis device (11/02/2013), Complication of anesthesia (2017), COPD (chronic obstructive pulmonary disease) (HCC), Diabetes mellitus without complication (HCC), ESOPHAGEAL VARICES (10/04/2008), ESRD, Hemiparesis due to old stroke (HCC), HFrEF (heart failure with reduced ejection fraction) (HCC), Hypertension, LV dysfunction, Memory loss due to medical condition, MR (mitral regurgitation), Nausea with vomiting, unspecified (06/15/2021), Paroxysmal atrial fibrillation (HCC), Peripheral vascular disease (HCC), Shortness of breath, Stroke (HCC), and Tobacco abuse.   Surgical History:   Past Surgical History:  Procedure Laterality Date   ABDOMINAL AORTOGRAM W/LOWER EXTREMITY Bilateral 07/21/2018   Procedure: ABDOMINAL AORTOGRAM W/LOWER EXTREMITY;  Surgeon: Maeola Harman, MD;  Location: [facility] INVASIVE CV LAB;  Service: Cardiovascular;  Laterality: Bilateral;   AMPUTATION Left 11/05/2018   Procedure: AMPUTATION SECOND TOE LEFT FOOT;  Surgeon: Maeola Harman, MD;  Location: [facility] OR;  Service: Vascular;  Laterality: Left;   AMPUTATION Left 02/13/2021   Procedure:  Left small finger AMPUTATION DIGIT;  Surgeon: Marlyne Beards, MD;  Location: [facility] OR;  Service: Orthopedics;  Laterality: Left;   AV  FISTULA PLACEMENT     CARDIAC CATHETERIZATION     [facility]   COLONOSCOPY W/ BIOPSIES AND POLYPECTOMY     FISTULA SUPERFICIALIZATION Left 11/10/2013   Procedure: FISTULA PLICATION;  Surgeon: Nada Libman, MD;  Location: [facility] OR;  Service: Vascular;  Laterality: Left;   INSERTION OF DIALYSIS CATHETER N/A 06/20/2021   Procedure: INSERTION OF DIALYSIS CATHETER;  Surgeon: Victorino Sparrow, MD;  Location: [facility] OR;  Service: Vascular;  Laterality: N/A;   KIDNEY TRANSPLANT     2011 rejected kidney 2012 back on dialysis   LEFT AND RIGHT HEART CATHETERIZATION WITH CORONARY ANGIOGRAM N/A 10/09/2013   Procedure: LEFT AND RIGHT HEART CATHETERIZATION WITH CORONARY ANGIOGRAM;  Surgeon: Kathleene Hazel, MD;  Location: [facility] CATH LAB;  Service: Cardiovascular;  Laterality: N/A;   LIGATION OF ARTERIOVENOUS  FISTULA Left 06/20/2021   Procedure: LIGATION OF ARTERIOVENOUS  FISTULA;  Surgeon: Victorino Sparrow, MD;  Location: [facility] OR;  Service: Vascular;  Laterality: Left;   PERIPHERAL VASCULAR INTERVENTION Left 07/21/2018   Procedure: PERIPHERAL VASCULAR INTERVENTION;  Surgeon: Maeola Harman, MD;  Location: [facility] INVASIVE CV LAB;  Service: Cardiovascular;  Laterality: Left;  Popliteal   REVISON OF ARTERIOVENOUS FISTULA Left 08/26/2013   Procedure: EXCISION OF ERODED SKIN AND EXPLORATION OF MAIN LEFT UPPER ARM AV FISTULA;  Surgeon: Larina Earthly, MD;  Location: [facility] OR;  Service: Vascular;  Laterality: Left;   REVISON OF ARTERIOVENOUS FISTULA Left 02/05/2014   Procedure: REPAIR OF ARTERIOVENOUS FISTULA ANEURYSM;  Surgeon: Nada Libman, MD;  Location: [facility] OR;  Service: Vascular;  Laterality: Left;   REVISON OF ARTERIOVENOUS FISTULA Left 03/10/2021   Procedure: LEFT ARM REVISION OF ARTERIOVENOUS FISTULA WITH PLICATION;  Surgeon: Maeola Harman, MD;  Location: [facility] OR;  Service: Vascular;  Laterality: Left;   RIGHT/LEFT HEART CATH AND CORONARY ANGIOGRAPHY N/A 06/21/2022   Procedure: RIGHT/LEFT HEART  CATH AND CORONARY ANGIOGRAPHY;  Surgeon: Lennette Bihari, MD;  Location: [facility] INVASIVE CV LAB;  Service: Cardiovascular;  Laterality: N/A;   SCROTAL EXPLORATION N/A 09/16/2022   Procedure: PENILE DEBRIDMENT;  Surgeon: Rene Paci, MD;  Location: [facility] OR;  Service: Urology;  Laterality: N/A;   SHUNTOGRAM N/A 11/05/2012   Procedure: Fistulogram;  Surgeon: Nada Libman, MD;  Location: [facility] CATH LAB;  Service: Cardiovascular;  Laterality: N/A;   TEE WITHOUT CARDIOVERSION  08/17/2011   Procedure: TRANSESOPHAGEAL ECHOCARDIOGRAM (TEE);  Surgeon: Laurey Morale, MD;  Location: [facility] ENDOSCOPY;  Service: Cardiovascular;  Laterality: N/A;   ULTRASOUND GUIDANCE FOR VASCULAR ACCESS  06/20/2021   Procedure: ULTRASOUND GUIDANCE FOR VASCULAR ACCESS;  Surgeon: Victorino Sparrow, MD;  Location: [facility] OR;  Service: Vascular;;   VIDEO ASSISTED THORACOSCOPY (VATS)/DECORTICATION  08/10/11   Social History:   reports that he quit smoking about 11 years ago. His smoking use included cigarettes. He started smoking about 26 years ago. He has a 7.5 pack-year smoking history. He has never used smokeless tobacco. He reports that he does not drink alcohol and does not use drugs.   Family History:  His family history includes CAD in his paternal uncle; Cancer in his paternal grandfather; Diabetes in his paternal grandmother; Hypertension in his mother; Varicose Veins in his mother.   Allergies: Allergies  Allergen Reactions   Codeine Shortness Of Breath   Dextromethorphan-Guaifenesin Shortness Of Breath and Other (See Comments)  Iron Dextran Shortness Of Breath and Other (See Comments)    Pt does not recall 02/05/23   Morphine And Codeine Shortness Of Breath    Other Reaction(s): Other (See Comments)    Home Medications: Prior to Admission medications   Medication Sig Start Date End Date Taking? Authorizing Provider  acetaminophen (TYLENOL) 500 MG tablet Take 1 tablet (500 mg total) by mouth every 6 (six) hours as  needed. Patient taking differently: Take 500 mg by mouth every 6 (six) hours as needed for mild pain (pain score 1-3). 04/11/18  Yes Burky, Barron Alvine, NP  albuterol (PROAIR HFA) 108 (90 Base) MCG/ACT inhaler Inhale 2 puffs into the lungs every 6 (six) hours as needed for wheezing or shortness of breath. 11/30/22  Yes Rai, Ripudeep K, MD  ELIQUIS 5 MG TABS tablet Take 5 mg by mouth 2 (two) times daily. 12/09/21  Yes [provider]  levocetirizine (XYZAL) 5 MG tablet Take 5 mg by mouth daily as needed for allergies.   Yes [provider]  midodrine (PROAMATINE) 5 MG tablet Take 5 mg by mouth 3 (three) times daily. Take one tablet by mouth three times daily on non-dialysis days. Monday, Wednesday, Friday, and Sunday. 01/21/23  Yes [provider]  VELPHORO 500 MG chewable tablet Chew 1,000 mg by mouth 2 (two) times daily after a meal. 02/06/21  Yes [provider]  VISINE 0.05 % ophthalmic solution Place 2-3 drops into both eyes 2 (two) times daily as needed (dry eye). 07/04/21  Yes [provider]   Critical care time:   The patient is critically ill with multiple organ system failure and requires high complexity decision making for assessment and support, frequent evaluation and titration of therapies, advanced monitoring, review of radiographic studies and interpretation of complex data.   Critical Care Time devoted to patient care services, exclusive of separately billable procedures, described in this note is 42 minutes.  Tim Lair, PA-C [facility] 02/28/23 3:46 AM  Please see [email or web address] for pager details.  From 7A-7P if no response, please call [phone] After hours, please call ELink [phone]

## 2023-02-28 NOTE — Progress Notes (Addendum)
 Initial Nutrition Assessment  DOCUMENTATION CODES:   Not applicable however likely at nutrition risk r/t edema likely masking some muscle and fat wasting  INTERVENTION:  Initiate tube feeding via OGT: Osmolite 1.5 at 60 ml/h (1440 ml per day) Initiate at 71mL/hr and increase by 28mL/hr Q8 hours until goal rate achieved  Provides 2160 kcal, 90 gm protein, 1097 ml free water daily    NUTRITION DIAGNOSIS:  Inadequate oral intake related to inability to eat as evidenced by NPO status.  GOAL:  Patient will meet greater than or equal to 90% of their needs  MONITOR:  Diet advancement, TF tolerance, Labs  REASON FOR ASSESSMENT:  Ventilator   ASSESSMENT:   Patient admitted to [facility] on 2/17 with c/o SOB after shortened HD tx. Was slated to discharge 03/01/2023, but concerned about transport 2/2 weather conditions. Received another HD tx evening of 01-Mar-2023 and coded half way into tx. Transferred to 2H, thereafter. PMH: HTN, HLD, CVA (L sided deficits), PVD, PAF, HFrEF, severe MR, COPD, T2DM, ESRD-HD.   He required 4 mins of CPR before ROSC. Of note, pt recently admitted 1/27-1/29 for volume overload/pulmonary edema. He is now off pressors. Discussed initiation of TF regimen with attending via secure chat. Amicable to initiation and advancement per protocol.   Patient is currently intubated on ventilator support MV: 11 L/min Temp (24hrs), Avg:98.1 F (36.7 C), Min:96.9 F (36.1 C), Max:98.9 F (37.2 C)  He consumed diet prior to admission to ICU w/ 50% of his meal documented on Mar 01, 2023. Appears well-nourished at baseline with some mild  muscle wasting. Of note, he is with some edema that may be masking some additional muscle and fat wasting. No family at bedside to endorse nutrition-related history.   UBW unable to be reported, however chart review showing weight hx ranging from 67.2-74.8kg over last six months.   Admit Weight: 68kg Current Weight: 70.2kg EDW: 67.5kg  Net IO Since Admission:  -1,905.06 mL [02/28/23 1548]   Non-pitting edema noted to BLEs. Last full HD tx on 2/18 with UF 2.3L.    Drains/Lines: OGT 2023-03-01: curves to the left in the stomach with the tip in the proximal fundal area) TDC   Pressors stopped. Fentanyl dosage trending up. Lactic acid trending down. Sodium low likely 2/2 volume overload.   Meds: docusate, Miralax  Drips: Levophed stopped on 2/20 @ 1035 Fentanyl 280mcg/hr  Labs: Na+ 133 (L) K+ 3.8 (wdl) Lactic Acid 6.5>3.9 (H) CBGs 158-175 over 24 hours A1c 4.8 (11/2022)    NUTRITION - FOCUSED PHYSICAL EXAM:  Flowsheet Row Most Recent Value  Orbital Region No depletion  Upper Arm Region Mild depletion  Thoracic and Lumbar Region No depletion  Buccal Region Unable to assess  Temple Region No depletion  Clavicle Bone Region Mild depletion  Clavicle and Acromion Bone Region No depletion  Scapular Bone Region Mild depletion  Dorsal Hand Unable to assess  [mittens]  Patellar Region No depletion  Anterior Thigh Region Moderate depletion  Posterior Calf Region Mild depletion  Edema (RD Assessment) Mild  Hair Reviewed  Eyes Reviewed  Mouth Reviewed  Skin Reviewed  Nails Reviewed    Diet Order:   Diet Order             Diet NPO time specified  Diet effective now           Diet - low sodium heart healthy             EDUCATION NEEDS:   Not appropriate for education  at this time  Skin:  Skin Assessment: Reviewed RN Assessment  Last BM:  2/18  Height:  Ht Readings from Last 1 Encounters:  02/28/23 5\' 7"  (1.702 m)   Weight:  Wt Readings from Last 1 Encounters:  02/28/23 70.2 kg   Ideal Body Weight:  67.3 kg  BMI:  Body mass index is 24.24 kg/m.  Estimated Nutritional Needs:   Kcal:  2000-2200kcal  Protein:  90-100g  Fluid:  1L + UOP  Myrtie Cruise MS, RD, LDN Registered Dietitian Clinical Nutrition RD Inpatient Contact Info in Amion

## 2023-02-28 NOTE — Progress Notes (Signed)
 NAME:  Nathaniel White, MRN:  [id number], DOB:  12/13/1960, LOS: 2 ADMISSION DATE:  02/25/2023 CONSULTATION DATE:  02/28/2023 REFERRING MD:  Jerral Ralph - TRH,  CHIEF COMPLAINT:  Cardiac arrest   History of Present Illness:  63 year old man who presented to [facility] 2/17 for SOB after truncated HD session (last 2/15). PMHx significant for HTN, HLD, CVA (with L-sided deficits), PVD, PAF (on Eliquis), HFrEF with NICM, severe MR, COPD (on 2-3L HOT), T2DM, ESRD on HD TTS. Recent admission 1/27-1/29 for volume overload/pulmonary edema.   Patient was reportedly slated for discharge 2/19 but remained in-house due to weather conditions and concern he would not be able to to have transport to HD 2/20; HD was completed overnight 2/19-2/20. While in HD 2/20AM, Code Blue was called for VF arrest. CPR x 4 minutes with Epi x 1, amio 150mg  and 300mg , lidocaine x 1, Mg/Ca repletion. ROSC was achieved and patient was awake and alert, repeatedly saying "help me" and "I can't breathe". NRB was attempted with ongoing extremis. On two additional occasions, patient rhythm deteriorated into monomorphic VT and he was shocked x 2. Decision was made to intubate.  PCCM consulted for ICU transfer and management.  Pertinent Medical History:   Past Medical History:  Diagnosis Date   Anemia    Anxiety    Asthma    Complication from renal dialysis device 11/02/2013   Complication of anesthesia 2017   according to pt and spouse pt was moving around and cough while under and pt had difficulty waking up so the anesthesia had to be reversed. and pt admitted to ICU.    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/2008   Qualifier: Diagnosis of  By: Russella Dar MD Marylu Lund    ESRD    on HD, T-TH-Sat - [place]    Hemiparesis due to old stroke [facility])    left   HFrEF (heart failure with reduced ejection fraction) (HCC)    NICM // TTE 06/2022: EF 25-30, sever RV dysfunction, mod to severe MR   Hypertension    Hx, not current problems, no meds   LV dysfunction    EF 25-30% by echo 07/2011   Memory loss due to medical condition    due to stroke   MR (mitral regurgitation)    moderate to severe, echo 07/2011   Nausea with vomiting, unspecified 06/15/2021   Paroxysmal atrial fibrillation (HCC)    Peripheral vascular disease (HCC)    Shortness of breath    with exertion   Stroke [facility])    TIA's-left sided weakness   Tobacco abuse     Significant Hospital Events: Including procedures, antibiotic start and stop dates in addition to other pertinent events   2/20 - Cardiac arrest in HD, initial rhythm VF with CPR x 2 rounds, Epi/Mg/Ca, shocked x 2; after ROSC, monomorphic VT requiring shock x 2. Intubated at bedside in dialysis. Transferred to [facility] CVICU. Cardiology consulted.  Interim History / Subjective:  Seen lying in bed on mechanical ventilation in no acute distress  Objective:  Blood pressure (!) 133/56, pulse (!) 55, temperature 98.9 F (37.2 C), temperature source Axillary, resp. rate (!) 22, height 5\' 7"  (1.702 m), weight 70.2 kg, SpO2 93%.    Vent Mode: PRVC FiO2 (%):  [  40 %-100 %] 40 % Set Rate:  [22 bmp] 22 bmp Vt Set:  [530 mL] 530 mL PEEP:  [5 cmH20] 5 cmH20 Plateau Pressure:  [32 cmH20] 32 cmH20   Intake/Output Summary (Last 24 hours) at 02/28/2023 0739 Last data filed at 02/28/2023 1610 Gross per 24 hour  Intake 347.28 ml  Output 400 ml  Net -52.72 ml   Filed Weights   02/26/23 1314 02/28/23 0630  Weight: 68 kg 70.2 kg   Physical Examination: General: Acute on chronic ill-appearing deconditioned elderly male lying in bed on mechanical ventilation no acute distress HEENT: ETT, MM pink/moist, PERRL,  Neuro: Opens eyes to verbal stimuli, follows commands on vent CV: s1s2 regular  rate and rhythm, no murmur, rubs, or gallops,  PULM: Clear to auscultation bilaterally, no increased work of breathing, no added breath sounds GI: soft, bowel sounds active in all 4 quadrants, non-tender, non-distended Extremities: warm/dry, no edema  Skin: no rashes or lesions  Resolved Hospital Problem List:    Assessment & Plan:  Post-VT/VF cardiac arrest, in-hospital Cardiogenic shock, improving P: Goal of normothermia Continue pressors for MAP goal greater than 65 Continuous telemetry Follow-up echocardiogram Optimize electrolytes, K greater than 4, mag greater than 2 Trend troponins  History of VT HFrEF with NICM -Echo with EF 30-35% Paroxysmal Afib -Home medication includes Eliquis Prolonged QTc P: Cardiology following, appreciate assistance Continue lidocaine drip Optimize electrolytes as above Avoid QTc prolonging medication Continuous telemetry Heparin drip, hold home Eliquis  Acute hypoxemic respiratory failure P: Continue ventilator support with lung protective strategies  Wean PEEP and FiO2 for sats greater than 90%. Head of bed elevated 30 degrees. Plateau pressures less than 30 cm H20.  Follow intermittent chest x-ray and ABG.   SAT/SBT as tolerated, mentation preclude extubation  Ensure adequate pulmonary hygiene  Follow cultures  VAP bundle in place  PAD protocol  ESRD on HD TTS P: Nephrology following, appreciate assistance Trend electrolytes Monitor intake and output Avoid nephrotoxins  T2DM P: SSI CBG checks every 4 CBG: 140-180   Best Practice: (right click and "Reselect all SmartList Selections" daily)   Diet/type: NPO DVT prophylaxis: SCDs, heparin gtt GI prophylaxis: H2B Lines: Dialysis Catheter Foley:  N/A Code Status:  full code Last date of multidisciplinary goals of care discussion [Pending]  Critical care time:  CRITICAL CARE Performed by: Adeliz Tonkinson D. Harris  Total critical care time: 38 minutes  Critical care  time was exclusive of separately billable procedures and treating other patients.  Critical care was necessary to treat or prevent imminent or life-threatening deterioration.  Critical care was time spent personally by me on the following activities: development of treatment plan with patient and/or surrogate as well as nursing, discussions with consultants, evaluation of patient's response to treatment, examination of patient, obtaining history from patient or surrogate, ordering and performing treatments and interventions, ordering and review of laboratory studies, ordering and review of radiographic studies, pulse oximetry and re-evaluation of patient's condition.  Yuta Cipollone D. Harris, NP-C [facility] Personal contact information can be found on Amion  If no contact or response made please call [phone] 02/28/2023, 7:46 AM

## 2023-02-28 NOTE — Procedures (Signed)
 Intubation Procedure Note  LORENZA WINKLEMAN  [id number]  [birth year]  Date:02/28/23  Time:3:05 AM   Provider Performing:Emarie Paul V. Jacee Enerson    Procedure: Intubation (31500)  Indication(s) Respiratory Failure  Consent Unable to obtain consent due to emergent nature of procedure.   Anesthesia Etomidate, Fentanyl, and Rocuronium   Time Out Verified patient identification, verified procedure, site/side was marked, verified correct patient position, special equipment/implants available, medications/allergies/relevant history reviewed, required imaging and test results available.   Sterile Technique Usual hand hygeine, masks, and gloves were used   Procedure Description Patient positioned in bed supine.  Sedation given as noted above.  Patient was intubated with endotracheal tube using Glidescope.  View was Grade 1 full glottis .  Number of attempts was 1.  Colorimetric CO2 detector was consistent with tracheal placement.   Complications/Tolerance None; patient tolerated the procedure well. Chest X-ray is ordered to verify placement.   EBL Minimal   Specimen(s) None  Aristeo Hankerson V. Vassie Loll MD

## 2023-02-28 NOTE — Progress Notes (Signed)
 IV team responded to Code Blue. No intervention needed from IV team.

## 2023-03-01 ENCOUNTER — Inpatient Hospital Stay (HOSPITAL_COMMUNITY): Payer: Medicare Other

## 2023-03-01 DIAGNOSIS — R579 Shock, unspecified: Secondary | ICD-10-CM

## 2023-03-01 DIAGNOSIS — R638 Other symptoms and signs concerning food and fluid intake: Secondary | ICD-10-CM

## 2023-03-01 DIAGNOSIS — Z992 Dependence on renal dialysis: Secondary | ICD-10-CM | POA: Diagnosis not present

## 2023-03-01 DIAGNOSIS — E119 Type 2 diabetes mellitus without complications: Secondary | ICD-10-CM

## 2023-03-01 DIAGNOSIS — I469 Cardiac arrest, cause unspecified: Secondary | ICD-10-CM | POA: Diagnosis not present

## 2023-03-01 DIAGNOSIS — Z789 Other specified health status: Secondary | ICD-10-CM | POA: Diagnosis not present

## 2023-03-01 DIAGNOSIS — Z515 Encounter for palliative care: Secondary | ICD-10-CM

## 2023-03-01 DIAGNOSIS — J9601 Acute respiratory failure with hypoxia: Secondary | ICD-10-CM | POA: Diagnosis not present

## 2023-03-01 DIAGNOSIS — E8770 Fluid overload, unspecified: Secondary | ICD-10-CM | POA: Diagnosis not present

## 2023-03-01 DIAGNOSIS — J690 Pneumonitis due to inhalation of food and vomit: Secondary | ICD-10-CM

## 2023-03-01 DIAGNOSIS — Z9911 Dependence on respirator [ventilator] status: Secondary | ICD-10-CM

## 2023-03-01 DIAGNOSIS — Z711 Person with feared health complaint in whom no diagnosis is made: Secondary | ICD-10-CM

## 2023-03-01 DIAGNOSIS — N186 End stage renal disease: Secondary | ICD-10-CM | POA: Diagnosis not present

## 2023-03-01 LAB — COMPREHENSIVE METABOLIC PANEL
ALT: 23 U/L (ref 0–44)
AST: 55 U/L — ABNORMAL HIGH (ref 15–41)
Albumin: 2.2 g/dL — ABNORMAL LOW (ref 3.5–5.0)
Alkaline Phosphatase: 95 U/L (ref 38–126)
Anion gap: 15 (ref 5–15)
BUN: 43 mg/dL — ABNORMAL HIGH (ref 6–20)
CO2: 22 mmol/L (ref 22–32)
Calcium: 9.6 mg/dL (ref 8.9–10.3)
Chloride: 95 mmol/L — ABNORMAL LOW (ref 98–111)
Creatinine, Ser: 9.2 mg/dL — ABNORMAL HIGH (ref 0.61–1.24)
GFR, Estimated: 6 mL/min — ABNORMAL LOW (ref >60)
Glucose, Bld: 99 mg/dL (ref 70–99)
Potassium: 5.2 mmol/L — ABNORMAL HIGH (ref 3.5–5.1)
Sodium: 132 mmol/L — ABNORMAL LOW (ref 135–145)
Total Bilirubin: 0.9 mg/dL (ref 0.0–1.2)
Total Protein: 7.1 g/dL (ref 6.5–8.1)

## 2023-03-01 LAB — BLOOD GAS, VENOUS
Acid-base deficit: 2.8 mmol/L — ABNORMAL HIGH (ref 0.0–2.0)
Bicarbonate: 24.1 mmol/L (ref 20.0–28.0)
Drawn by: 6451
O2 Saturation: 66.6 %
Patient temperature: 37
pCO2, Ven: 49 mm[Hg] (ref 44–60)
pH, Ven: 7.3 (ref 7.25–7.43)
pO2, Ven: 42 mm[Hg] (ref 32–45)

## 2023-03-01 LAB — CBC
HCT: 36.8 % — ABNORMAL LOW (ref 39.0–52.0)
Hemoglobin: 11 g/dL — ABNORMAL LOW (ref 13.0–17.0)
MCH: 24.7 pg — ABNORMAL LOW (ref 26.0–34.0)
MCHC: 29.9 g/dL — ABNORMAL LOW (ref 30.0–36.0)
MCV: 82.5 fL (ref 80.0–100.0)
Platelets: 153 10*3/uL (ref 150–400)
RBC: 4.46 MIL/uL (ref 4.22–5.81)
RDW: 18.4 % — ABNORMAL HIGH (ref 11.5–15.5)
WBC: 9.7 10*3/uL (ref 4.0–10.5)
nRBC: 0 % (ref 0.0–0.2)

## 2023-03-01 LAB — LACTIC ACID, PLASMA: Lactic Acid, Venous: 1.1 mmol/L (ref 0.5–1.9)

## 2023-03-01 LAB — RENAL FUNCTION PANEL
Albumin: 2.1 g/dL — ABNORMAL LOW (ref 3.5–5.0)
Anion gap: 12 (ref 5–15)
BUN: 37 mg/dL — ABNORMAL HIGH (ref 6–20)
CO2: 21 mmol/L — ABNORMAL LOW (ref 22–32)
Calcium: 9 mg/dL (ref 8.9–10.3)
Chloride: 98 mmol/L (ref 98–111)
Creatinine, Ser: 7.18 mg/dL — ABNORMAL HIGH (ref 0.61–1.24)
GFR, Estimated: 8 mL/min — ABNORMAL LOW (ref >60)
Glucose, Bld: 135 mg/dL — ABNORMAL HIGH (ref 70–99)
Phosphorus: 4.7 mg/dL — ABNORMAL HIGH (ref 2.5–4.6)
Potassium: 5.1 mmol/L (ref 3.5–5.1)
Sodium: 131 mmol/L — ABNORMAL LOW (ref 135–145)

## 2023-03-01 LAB — GLUCOSE, CAPILLARY
Glucose-Capillary: 112 mg/dL — ABNORMAL HIGH (ref 70–99)
Glucose-Capillary: 108 mg/dL — ABNORMAL HIGH (ref 70–99)
Glucose-Capillary: 102 mg/dL — ABNORMAL HIGH (ref 70–99)
Glucose-Capillary: 135 mg/dL — ABNORMAL HIGH (ref 70–99)

## 2023-03-01 LAB — LIDOCAINE LEVEL: Lidocaine Lvl: NOT DETECTED ug/mL (ref 1.5–5.0)

## 2023-03-01 LAB — PROCALCITONIN: Procalcitonin: 14.19 ng/mL

## 2023-03-01 MED ORDER — VANCOMYCIN HCL 1500 MG/300ML IV SOLN
1500.0000 mg | Freq: Once | INTRAVENOUS | Status: AC
  Administered 2023-03-01: 1500 mg via INTRAVENOUS
  Filled 2023-03-01: qty 300

## 2023-03-01 MED ORDER — PIPERACILLIN-TAZOBACTAM 3.375 G IVPB 30 MIN
3.3750 g | Freq: Four times a day (QID) | INTRAVENOUS | Status: AC
  Administered 2023-03-01 – 2023-03-07 (×26): 3.375 g via INTRAVENOUS
  Filled 2023-03-01 (×26): qty 50

## 2023-03-01 MED ORDER — PRISMASOL BGK 4/2.5 32-4-2.5 MEQ/L EC SOLN: Status: DC

## 2023-03-01 MED ORDER — NOREPINEPHRINE 4 MG/250ML-% IV SOLN
0.0000 ug/min | INTRAVENOUS | Status: DC
  Administered 2023-03-01: 16 ug/min via INTRAVENOUS

## 2023-03-01 MED ORDER — BISACODYL 10 MG RE SUPP
10.0000 mg | Freq: Once | RECTAL | Status: AC
  Administered 2023-03-01: 10 mg via RECTAL
  Filled 2023-03-01: qty 1

## 2023-03-01 MED ORDER — FAMOTIDINE 20 MG PO TABS
20.0000 mg | ORAL_TABLET | Freq: Every day | ORAL | Status: DC
  Administered 2023-03-02 – 2023-03-03 (×2): 20 mg
  Filled 2023-03-01 (×2): qty 1

## 2023-03-01 MED ORDER — VASOPRESSIN 20 UNITS/100 ML INFUSION FOR SHOCK
0.0000 [IU]/min | INTRAVENOUS | Status: DC
  Administered 2023-03-01 – 2023-03-09 (×19): 0.03 [IU]/min via INTRAVENOUS
  Filled 2023-03-01 (×19): qty 100

## 2023-03-01 MED ORDER — HEPARIN SODIUM (PORCINE) 1000 UNIT/ML DIALYSIS: 1000.0000 [IU] | INTRAMUSCULAR | Status: DC | PRN

## 2023-03-01 MED ORDER — SORBITOL 70 % SOLN
30.0000 mL | Freq: Once | Status: AC
  Administered 2023-03-01: 30 mL
  Filled 2023-03-01: qty 30

## 2023-03-01 MED ORDER — FENTANYL CITRATE PF 50 MCG/ML IJ SOSY
25.0000 ug | PREFILLED_SYRINGE | INTRAMUSCULAR | Status: DC | PRN
  Administered 2023-03-02 – 2023-03-03 (×14): 50 ug via INTRAVENOUS
  Administered 2023-03-04: 100 ug via INTRAVENOUS
  Administered 2023-03-04: 50 ug via INTRAVENOUS
  Administered 2023-03-04: 100 ug via INTRAVENOUS
  Administered 2023-03-05 (×4): 50 ug via INTRAVENOUS
  Administered 2023-03-05: 100 ug via INTRAVENOUS
  Administered 2023-03-06 (×2): 50 ug via INTRAVENOUS
  Administered 2023-03-06: 25 ug via INTRAVENOUS
  Administered 2023-03-07: 75 ug via INTRAVENOUS
  Administered 2023-03-07 (×2): 50 ug via INTRAVENOUS
  Administered 2023-03-07: 75 ug via INTRAVENOUS
  Administered 2023-03-07: 50 ug via INTRAVENOUS
  Administered 2023-03-08 – 2023-03-09 (×4): 100 ug via INTRAVENOUS
  Administered 2023-03-09 (×2): 50 ug via INTRAVENOUS
  Administered 2023-03-09: 100 ug via INTRAVENOUS
  Filled 2023-03-01 (×2): qty 1
  Filled 2023-03-01 (×2): qty 2
  Filled 2023-03-01 (×4): qty 1
  Filled 2023-03-01: qty 2
  Filled 2023-03-01 (×2): qty 1
  Filled 2023-03-01 (×3): qty 2
  Filled 2023-03-01: qty 1
  Filled 2023-03-01: qty 2
  Filled 2023-03-01 (×4): qty 1
  Filled 2023-03-01 (×3): qty 2
  Filled 2023-03-01: qty 1
  Filled 2023-03-01 (×3): qty 2
  Filled 2023-03-01 (×2): qty 1
  Filled 2023-03-01: qty 2
  Filled 2023-03-01: qty 1
  Filled 2023-03-01: qty 2
  Filled 2023-03-01 (×2): qty 1
  Filled 2023-03-01: qty 2

## 2023-03-01 MED ORDER — SODIUM CHLORIDE 0.9 % IV SOLN: INTRAVENOUS | Status: AC | PRN

## 2023-03-01 MED ORDER — OSMOLITE 1.5 CAL PO LIQD
1000.0000 mL | ORAL | Status: DC
  Administered 2023-03-01 – 2023-03-04 (×3): 1000 mL
  Filled 2023-03-01 (×2): qty 1000

## 2023-03-01 MED ORDER — HYDROCORTISONE SOD SUC (PF) 100 MG IJ SOLR
100.0000 mg | Freq: Two times a day (BID) | INTRAMUSCULAR | Status: DC
  Administered 2023-03-01 – 2023-03-07 (×12): 100 mg via INTRAVENOUS
  Filled 2023-03-01 (×12): qty 2

## 2023-03-01 MED ORDER — FENTANYL CITRATE PF 50 MCG/ML IJ SOSY
25.0000 ug | PREFILLED_SYRINGE | INTRAMUSCULAR | Status: DC | PRN
Start: 2023-03-01 — End: 2023-03-09
  Administered 2023-03-06: 25 ug via INTRAVENOUS
  Filled 2023-03-01: qty 1

## 2023-03-01 MED ORDER — VANCOMYCIN HCL 750 MG/150ML IV SOLN
750.0000 mg | INTRAVENOUS | Status: DC
  Administered 2023-03-02 – 2023-03-03 (×2): 750 mg via INTRAVENOUS
  Filled 2023-03-01 (×3): qty 150

## 2023-03-01 MED ORDER — PROSOURCE TF20 ENFIT COMPATIBL EN LIQD
60.0000 mL | Freq: Every day | ENTERAL | Status: DC
Start: 2023-03-01 — End: 2023-03-04
  Administered 2023-03-01 – 2023-03-04 (×4): 60 mL
  Filled 2023-03-01 (×4): qty 60

## 2023-03-01 MED ORDER — NOREPINEPHRINE 16 MG/250ML-% IV SOLN
0.0000 ug/min | INTRAVENOUS | Status: DC
  Administered 2023-03-01: 16 ug/min via INTRAVENOUS
  Administered 2023-03-02: 20 ug/min via INTRAVENOUS
  Administered 2023-03-02: 26 ug/min via INTRAVENOUS
  Administered 2023-03-03: 18 ug/min via INTRAVENOUS
  Administered 2023-03-03: 26.987 ug/min via INTRAVENOUS
  Administered 2023-03-04: 10 ug/min via INTRAVENOUS
  Administered 2023-03-05: 8 ug/min via INTRAVENOUS
  Administered 2023-03-06: 19 ug/min via INTRAVENOUS
  Administered 2023-03-07: 14 ug/min via INTRAVENOUS
  Administered 2023-03-08: 12 ug/min via INTRAVENOUS
  Filled 2023-03-01 (×11): qty 250

## 2023-03-01 NOTE — Progress Notes (Addendum)
 NAME:  Nathaniel White, MRN:  [id number], DOB:  1960-04-02, LOS: 3 ADMISSION DATE:  02/25/2023 CONSULTATION DATE:  02/28/2023 REFERRING MD:  Jerral Ralph - TRH,  CHIEF COMPLAINT:  Cardiac arrest   History of Present Illness:  63 year old man who presented to [facility] 2/17 for SOB after truncated HD session (last 2/15). PMHx significant for HTN, HLD, CVA (with L-sided deficits), PVD, PAF (on Eliquis), HFrEF with NICM, severe MR, COPD (on 2-3L HOT), T2DM, ESRD on HD TTS. Recent admission 1/27-1/29 for volume overload/pulmonary edema.   Patient was reportedly slated for discharge 2/19 but remained in-house due to weather conditions and concern he would not be able to to have transport to HD 2/20; HD was completed overnight 2/19-2/20. While in HD 2/20AM, Code Blue was called for VF arrest. CPR x 4 minutes with Epi x 1, amio 150mg  and 300mg , lidocaine x 1, Mg/Ca repletion. ROSC was achieved and patient was awake and alert, repeatedly saying "help me" and "I can't breathe". NRB was attempted with ongoing extremis. On two additional occasions, patient rhythm deteriorated into monomorphic VT and he was shocked x 2. Decision was made to intubate.  PCCM consulted for ICU transfer and management.  Pertinent Medical History:   Past Medical History:  Diagnosis Date   Anemia    Anxiety    Asthma    Complication from renal dialysis device 11/02/2013   Complication of anesthesia 2017   according to pt and spouse pt was moving around and cough while under and pt had difficulty waking up so the anesthesia had to be reversed. and pt admitted to ICU.    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/2008   Qualifier: Diagnosis of  By: Russella Dar MD Marylu Lund    ESRD    on HD, T-TH-Sat - [place]    Hemiparesis due to old stroke [facility])    left   HFrEF (heart failure with reduced ejection fraction) (HCC)    NICM // TTE 06/2022: EF 25-30, sever RV dysfunction, mod to severe MR   Hypertension    Hx, not current problems, no meds   LV dysfunction    EF 25-30% by echo 07/2011   Memory loss due to medical condition    due to stroke   MR (mitral regurgitation)    moderate to severe, echo 07/2011   Nausea with vomiting, unspecified 06/15/2021   Paroxysmal atrial fibrillation (HCC)    Peripheral vascular disease (HCC)    Shortness of breath    with exertion   Stroke [facility])    TIA's-left sided weakness   Tobacco abuse     Significant Hospital Events: Including procedures, antibiotic start and stop dates in addition to other pertinent events   2/20 - Cardiac arrest in HD, initial rhythm VF with CPR x 2 rounds, Epi/Mg/Ca, shocked x 2; after ROSC, monomorphic VT requiring shock x 2. Intubated at bedside in dialysis. Transferred to [facility] CVICU. Cardiology consulted. NE weaned off during day   Interim History / Subjective:  No distress.  Failed weaning attempt  Objective:  Blood pressure (!) 144/81, pulse (!) 56, temperature 98.5 F (36.9 C), temperature source Axillary, resp. rate (!) 23, height 5\' 7"  (1.702 m), weight 70.2 kg, SpO2 100%.    Vent Mode: PRVC  FiO2 (%):  [40 %] 40 % Set Rate:  [22 bmp] 22 bmp Vt Set:  [530 mL] 530 mL PEEP:  [5 cmH20] 5 cmH20 Plateau Pressure:  [28 cmH20-34 cmH20] 34 cmH20   Intake/Output Summary (Last 24 hours) at 03/01/2023 0754 Last data filed at 03/01/2023 0400 Gross per 24 hour  Intake 1211.73 ml  Output --  Net 1211.73 ml   Filed Weights   02/26/23 1314 02/28/23 0630  Weight: 68 kg 70.2 kg   Physical Examination: General This is a chronically ill-appearing 63 year old male patient currently on full ventilator support he will follow commands HEENT sclera injected, orally intubated, copious oral secretions Pulmonary faint basilar rales, currently on  full ventilator support, was unable to pull over 100 cc tidal volume on pressure support even titrating up to pressure support 20 cm water pressure Portable chest x-ray personally reviewed from yesterday shows bilateral right greater than left airspace disease Cardiac regular rate and rhythm without murmur rub or gallop Abdomen soft not tender Extremities warm dry no significant edema Neuro awake follows commands, has left-sided hemiparesis/contractures GU anuric Resolved Hospital Problem List:  Cardiogenic shock  VT/VF in-hospital cardiac arrest 2/20  Assessment & Plan:    End Stage  NICM (EF 30-35%) w/ chronic hypotension Paroxysmal Afib and Prolonged QTc, s/p in-hospital cardiac arrest 2/20 (TIME TO rosc 4 MIN) -Home medication includes Eliquis plan Continue lidocaine drip and antiarrhythmic rx per cards Optimize electrolytes as above Avoid QTc prolonging medication Continuous telemetry Eliquis VT  Not candidate for GDMT Cont midodrine VT, blood pressure intermittently as high as 150 however I suspect he will need this for dialysis Need to re-engage palliative (especially if we think ability to tol iHD hemodynamically could be an issue)  Post cardiac arrest lactic acidosis Plan Repeating this am to ensure clearance   Acute hypoxemic respiratory failure following VF arrest w/ bilateral airspace disease most likely reflecting pulmonary edema  Plan Cont full vent support Daily assessment for weaning Check venous blood gas, I suspect we can decrease his minute ventilation some Repeat PCXR today  Ck PCT & CBC Send sputum  PAD protocol RASS goal 0 to -1 VAP bundle  Am cxr   ESRD on HD TTS Plan Daily chems Strict I&O Last iHD 2/20 w/ plan for next [facility] Saturday 22nd. Will check chem & ABG this am to assure no indication to do this sooner   T2DM plan Goal glucose 140-180 Will cont to monitor q 4   Best Practice: (right click and "Reselect all SmartList Selections"  daily)   Diet/type: NPO DVT prophylaxis: SCDs, heparin gtt GI prophylaxis: H2B Lines: Dialysis Catheter Foley:  N/A Code Status:  full code Last date of multidisciplinary goals of care discussion [Pending]  Critical care time: 33 min  CRITICAL CARE Performed by: Shelby Mattocks

## 2023-03-01 NOTE — Procedures (Signed)
 Arterial Catheter Insertion Procedure Note  Nathaniel White  [id number]  12-28-1960  Date:03/01/23  Time:1:31 PM    Provider Performing: Shelby Mattocks    Procedure: Insertion of Arterial Line (78295) with US guidance (62130)   Indication(s) Blood pressure monitoring and/or need for frequent ABGs  Consent Unable to obtain consent due to emergent nature of procedure.  Anesthesia None   Time Out Verified patient identification, verified procedure, site/side was marked, verified correct patient position, special equipment/implants available, medications/allergies/relevant history reviewed, required imaging and test results available.   Sterile Technique Maximal sterile technique including full sterile barrier drape, hand hygiene, sterile gown, sterile gloves, mask, hair covering, sterile ultrasound probe cover (if used).   Procedure Description Area of catheter insertion was cleaned with chlorhexidine and draped in sterile fashion. With real-time ultrasound guidance an arterial catheter was placed into the right femoral artery.  Appropriate arterial tracings confirmed on monitor.     Complications/Tolerance Although guidewire threaded easily I was not able to advance the catheter.   The procedure was then aborted    EBL Minimal Pressure held   Specimen(s) None

## 2023-03-01 NOTE — Progress Notes (Signed)
 Pharmacy Antibiotic Note  Nathaniel White is a 63 y.o. male admitted on 02/25/2023 with pneumonia. Pharmacy has been consulted for vancomycin and Zosyn dosing. Pt is ESRD on HD TTS now with pressor requirements to planning for CRRT.  Plan: Vancomycin IV x1 then 750mg  IV q24h Zosyn 3.375g IV q6h over 30 min  Height: 5\' 7"  (170.2 cm) Weight: 70.2 kg (154 lb 12.2 oz) IBW/kg (Calculated) : 66.1  Temp (24hrs), Avg:97.8 F (36.6 C), Min:97.3 F (36.3 C), Max:98.5 F (36.9 C)  Recent Labs  Lab 02/25/23 2133 02/26/23 0641 02/26/23 0820 02/27/23 0502 02/28/23 0221 02/28/23 0237 02/28/23 0319 02/28/23 0432 03/01/23 1059  WBC 6.9  --  6.0 5.2 10.4  --   --   --  9.7  CREATININE 8.29* 13.51*  --  8.85* 6.15*  --  6.56*  --  9.20*  LATICACIDVEN  --   --   --   --   --  6.5*  --  3.9* 1.1    Estimated Creatinine Clearance: 8 mL/min (A) (by C-G formula based on SCr of 9.2 mg/dL (H)).    Allergies  Allergen Reactions   Codeine Shortness Of Breath   Dextromethorphan-Guaifenesin Shortness Of Breath and Other (See Comments)   Iron Dextran Shortness Of Breath and Other (See Comments)    Pt does not recall 02/05/23   Morphine And Codeine Shortness Of Breath    Other Reaction(s): Other (See Comments)    Antimicrobials this admission: Vancomycin 2/21 >>  Zosyn 2/21 >>   Dose adjustments this admission: N/a  Microbiology results: None  Fredonia Highland, PharmD, BCPS, [facility] Clinical Pharmacist [phone] Please check AMION for all [facility] Pharmacy numbers 03/01/2023

## 2023-03-01 NOTE — Consult Note (Addendum)
 Consultation Note Date: 03/01/2023   Patient Name: Nathaniel White  DOB: Mar 11, 1960  MRN: [id number]  Age / Sex: 63 y.o., male  PCP: Claiborne Rigg, NP Referring Physician: Steffanie Dunn, DO  Reason for Consultation: Establishing goals of care  HPI/Patient Profile: 63 y.o. male  with past medical history of ESRD on HD TTS, COPD, chronic hypoxemic respiratory failure on 2.5 L home oxygen, chronic HFrEF (EF 35-40% 12/2022), severe mitral regurgitation, paroxysmal A-fib on Eliquis, chronic hypotension on midodrine, chronic anemia, PVD, CVA/TIA was admitted on 02/25/2023 with acute on chronic hypoxemic respiratory failure secondary to volume overload/pulmonary edema in setting of ESRD on HD, acute on chronic HFrEF and known severe mitral regurgitation, hypocalcemia, metabolic acidosis, elevated troponin, QT prolongation, end stage NICM (EF 30-35%) with chronic hypotension. His hospital course was complicated by code blue event on 2/21 - during HD patient went into VF arrest while receiving HD. CPR x4 minutes with ACLS medications administered. ROSC was achieved; however, on two additional occasions, patient rhythm deteriorated into monomorphic VT and he was shocked x 2. Decision was made to intubate.   Of note, patient as been admitted 6 times in the last 6 months. He is a 30 day readmission. PMT met with patient for GOC in June 2024 where goals were for full scope and DNR/DNI.  Clinical Assessment and Goals of Care: I have reviewed medical records including EPIC notes, labs, any available advanced directives, and imaging. Received report from primary RN - no acute concerns.    Went to visit patient at bedside - no family/visitors present. Patient was lying in bed intubated, on levo. Bedside procedure being performed - arterial catheter insertion.   Called patient's sister/HCPOA/Nathaniel - she is currently out of the country  and unable to complete full GOC at this time. Scheduled phone call for tomorrow at 11:30a for full GOC discussions.  Answered questions regarding patient's current clinical status.  Questions and concerns were addressed. The patient/family was encouraged to call with questions and/or concerns. PMT number was provided via Doximity text with Nathaniel's permission.   Primary Decision Maker: HCPOA - sister/Nathaniel White    SUMMARY OF RECOMMENDATIONS   Continue full code/full scope care at this time Patient's sister/HCPOA is currently out of the country - she is unable to complete GOC today. Scheduled meeting via phone for tomorrow 2/22 at 11:30a  PMT will continue to follow and support holistically   Code Status/Advance Care Planning: Full code  Palliative Prophylaxis:  Aspiration, Delirium Protocol, Frequent Pain Assessment, Oral Care, and Turn Reposition  Additional Recommendations (Limitations, Scope, Preferences): Full Scope Treatment  Psycho-social/Spiritual:  Desire for further Chaplaincy support:no Created space and opportunity for patient and family to express thoughts and feelings regarding patient's current medical situation.  Emotional support and therapeutic listening provided.  Prognosis:  Unable to determine  Discharge Planning: To Be Determined      Primary Diagnoses: Present on Admission:  Fluid overload  Acute on chronic hypoxic respiratory failure (HCC)  Paroxysmal atrial fibrillation (HCC)  Volume overload   I have reviewed the medical record, interviewed the patient and family, and examined the patient. The following aspects are pertinent.  Past Medical History:  Diagnosis Date   Anemia    Anxiety    Asthma    Complication from renal dialysis device 11/02/2013   Complication of anesthesia 2017   according to pt and spouse pt was moving around and cough while under and pt had difficulty waking up so the anesthesia  had to be reversed. and pt  admitted to ICU.    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/2008   Qualifier: Diagnosis of  By: Russella Dar MD Bronson Curb T    ESRD    on HD, T-TH-Sat - [place]   Hemiparesis due to old stroke [facility])    left   HFrEF (heart failure with reduced ejection fraction) (HCC)    NICM // TTE 06/2022: EF 25-30, sever RV dysfunction, mod to severe MR   Hypertension    Hx, not current problems, no meds   LV dysfunction    EF 25-30% by echo 07/2011   Memory loss due to medical condition    due to stroke   MR (mitral regurgitation)    moderate to severe, echo 07/2011   Nausea with vomiting, unspecified 06/15/2021   Paroxysmal atrial fibrillation (HCC)    Peripheral vascular disease (HCC)    Shortness of breath    with exertion   Stroke (HCC)    TIA's-left sided weakness   Tobacco abuse    Social History   Socioeconomic History   Marital status: Single    Spouse name: Not on file   Number of children: 1   Years of education: Not on file   Highest education level: Not on file  Occupational History   Occupation: Nurse, adult and Public librarian: DISABLED  Tobacco Use   Smoking status: Former    Current packs/day: 0.00    Average packs/day: 0.5 packs/day for 15.0 years (7.5 ttl pk-yrs)    Types: Cigarettes    Start date: 1999    Quit date: 2014    Years since quitting: 11.1   Smokeless tobacco: Never  Vaping Use   Vaping status: Never Used  Substance and Sexual Activity   Alcohol use: No    Alcohol/week: 0.0 standard drinks of alcohol   Drug use: No   Sexual activity: Not Currently  Other Topics Concern   Not on file  Social History Narrative   Not on file   Social Drivers of Health   Financial Resource Strain: Low Risk  (07/18/2022)   Overall  Financial Resource Strain (CARDIA)    Difficulty of Paying Living Expenses: Not hard at all  Food Insecurity: No Food Insecurity (02/26/2023)   Hunger Vital Sign    Worried About Running Out of Food in the Last Year: Never true    Ran Out of Food in the Last Year: Never true  Transportation Needs: No Transportation Needs (02/26/2023)   PRAPARE - Administrator, Civil Service (Medical): No    Lack of Transportation (Non-Medical): No  Physical Activity: Inactive (07/18/2022)   Exercise Vital Sign    Days of Exercise per Week: 0 days    Minutes of Exercise per Session: 0 min  Stress: No Stress Concern Present (07/18/2022)   [employer] of Occupational Health - Occupational Stress Questionnaire    Feeling of Stress : Only a little  Social Connections: Moderately Isolated (07/18/2022)   Social Connection and Isolation Panel [NHANES]    Frequency of Communication with Friends and Family: More than three times a week    Frequency of Social Gatherings with Friends and Family: Three times a week    Attends Religious  Services: 1 to 4 times per year    Active Member of Clubs or Organizations: No    Attends Banker Meetings: Never    Marital Status: Never married   Family History  Problem Relation Age of Onset   Hypertension Mother    Varicose Veins Mother    Diabetes Paternal Grandmother    Cancer Paternal Grandfather    CAD Paternal Uncle    Scheduled Meds:  apixaban  5 mg Per Tube BID   bisacodyl  10 mg Rectal Once   Chlorhexidine Gluconate Cloth  6 each Topical Daily   docusate  100 mg Per Tube BID   famotidine  20 mg Per Tube BID   midodrine  5 mg Per Tube 3 times per day on Sunday Monday Wednesday Friday   mouth rinse  15 mL Mouth Rinse Q2H   polyethylene glycol  17 g Per Tube Daily   Continuous Infusions:  feeding supplement (OSMOLITE 1.5 CAL) 40 mL/hr at 03/01/23 1200   heparin sodium (porcine)     lidocaine 1 mg/min (03/01/23 1200)    norepinephrine (LEVOPHED) Adult infusion 10 mcg/min (03/01/23 1200)   PRN Meds:.acetaminophen (TYLENOL) oral liquid 160 mg/5 mL, fentaNYL (SUBLIMAZE) injection, fentaNYL (SUBLIMAZE) injection, heparin sodium (porcine), ipratropium-albuterol, midazolam, mouth rinse, prochlorperazine Medications Prior to Admission:  Prior to Admission medications   Medication Sig Start Date End Date Taking? Authorizing Provider  acetaminophen (TYLENOL) 500 MG tablet Take 1 tablet (500 mg total) by mouth every 6 (six) hours as needed. Patient taking differently: Take 500 mg by mouth every 6 (six) hours as needed for mild pain (pain score 1-3). 04/11/18  Yes Burky, Barron Alvine, NP  albuterol (PROAIR HFA) 108 (90 Base) MCG/ACT inhaler Inhale 2 puffs into the lungs every 6 (six) hours as needed for wheezing or shortness of breath. 11/30/22  Yes Rai, Ripudeep K, MD  ELIQUIS 5 MG TABS tablet Take 5 mg by mouth 2 (two) times daily. 12/09/21  Yes [provider]  levocetirizine (XYZAL) 5 MG tablet Take 5 mg by mouth daily as needed for allergies.   Yes [provider]  midodrine (PROAMATINE) 5 MG tablet Take 5 mg by mouth 3 (three) times daily. Take one tablet by mouth three times daily on non-dialysis days. Monday, Wednesday, Friday, and Sunday. 01/21/23  Yes [provider]  VELPHORO 500 MG chewable tablet Chew 1,000 mg by mouth 2 (two) times daily after a meal. 02/06/21  Yes [provider]  VISINE 0.05 % ophthalmic solution Place 2-3 drops into both eyes 2 (two) times daily as needed (dry eye). 07/04/21  Yes [provider]   Allergies  Allergen Reactions   Codeine Shortness Of Breath   Dextromethorphan-Guaifenesin Shortness Of Breath and Other (See Comments)   Iron Dextran Shortness Of Breath and Other (See Comments)    Pt does not recall 02/05/23   Morphine And Codeine Shortness Of Breath    Other Reaction(s): Other (See Comments)   Review of Systems  Unable to perform ROS:  Acuity of condition    Physical Exam Vitals and nursing note reviewed.  Constitutional:      General: He is not in acute distress.    Appearance: He is ill-appearing.     Interventions: He is intubated.  Pulmonary:     Effort: No respiratory distress. He is intubated.  Skin:    General: Skin is warm and dry.  Neurological:     Motor: Weakness present.     Vital  Signs: BP (!) 100/28   Pulse 82   Temp 98.5 F (36.9 C) (Axillary)   Resp 18   Ht 5\' 7"  (1.702 m)   Wt 70.2 kg   SpO2 100%   BMI 24.24 kg/m  Pain Scale: CPOT   Pain Score: 2    SpO2: SpO2: 100 % O2 Device:SpO2: 100 % O2 Flow Rate: .O2 Flow Rate (L/min): 3 L/min  IO: Intake/output summary:  Intake/Output Summary (Last 24 hours) at 03/01/2023 1236 Last data filed at 03/01/2023 1200 Gross per 24 hour  Intake 1319.07 ml  Output 600 ml  Net 719.07 ml    LBM: Last BM Date : 02/25/23 Baseline Weight: Weight:  (In ER stretcher that is not zero'd out) Most recent weight: Weight: 70.2 kg     Palliative Assessment/Data: PPS 30% with tube feeds     Time In: 1220 Time Out: 1420 Time Total: 60 minutes  Signed by: Haskel Khan, NP   Please contact Palliative Medicine Team phone at [phone] for questions and concerns.  For individual provider: See Amion  *Portions of this note are a verbal dictation therefore any spelling and/or grammatical errors are due to the "Dragon Medical One" system interpretation.

## 2023-03-01 NOTE — Progress Notes (Signed)
 [facility] Progress Note   Subjective: pt seen in ICU - hemodynamic instability have worsened thru the day today. K+ is 5.2.  - is on high-dose pressor support now - family/ POA is out of country, palliative care meeting is planned for tomorrow Objective Vitals:   03/01/23 1230 03/01/23 1245 03/01/23 1300 03/01/23 1315  BP: (!) 106/48 (!) 87/49 109/86 104/73  Pulse: 82 84 84 80  Resp: (!) 22 (!) 21 (!) 21 18  Temp:      TempSrc:      SpO2: 100% 100% 100% 100%  Weight:      Height:       Physical Exam on vent ,sedated, chronically ill appearing  no jvd  throat ett in place  Chest cta bilat and lat  Cor reg no RG  Abd soft ntnd no ascites   Ext trace bilat ankle edema   Neuro on vent and sedated, not following commands  OP HD: SW TTS  4h  B400   67.5kg   2K bath  TDC   Heparin none - last HD 2/15, post wt 69kg, coming off 0.5- 1.5 kg over - good attendance, rarely stays on past 3.5 hrs - hectorol 3 mcg iv three times per week - parsabiv 7.5mg       CXR 2/17 - IMPRESSION: vascular congestion and probable mild interstitial edema. Small left effusion. 2. Chronic pleural and parenchymal scarring on the right. Similar peripheral bandlike opacities in the left mid to lower lung which could be due to chronic scarring or atelectasis.    Assessment/ Plan: Acute on chronic hypoxic resp failure - required BiPAP in ED initially. Had HD 2/18 w/ 2.3 L off w/ improved SOB and was down to 2 L [place] pre -arrest. Had arrest 2/20 early am as below. On vent now.  Cardiac arrest / fib and VT) - occurred 2/20 while on dialysis. Seen by cardiology as below. Getting IV lidocaine. Per cardiology.  Shock - post arrest, on levo gtt. Worsening this am.  End-stage biventricular (R/ L ) CHF / NICM - EF was 20% --> improved to 35%. No advanced options, seen by Palliative care during 06/2022 admission.  ESRD - on HD TTS. Had 2 hrs HD prior to code 2/20. Worsening shock means will need  CRRT. Perhaps a timed course of CRRT given his underlying severe comorbidities. Have d/w CCM.    Secondary hyperparathyroidism - CCa 10.7 Holding hectorol. Phos high, cont binders Anemia of esrd - hemoglobins are stable, no esa needs.  COPD - on home O2 2.5- 3 L [place] H/o CVA PAF  Rob Jalilah Wiltsie  MD  [facility] 03/01/2023, 1:30 PM  Recent Labs  Lab 02/27/23 0502 02/28/23 0221 02/28/23 0319 02/28/23 0517 03/01/23 1059  HGB 11.5* 11.0*  --  11.9* 11.0*  ALBUMIN 2.3* 2.0*  --   --  2.2*  CALCIUM 9.3 10.5* 9.8  --  9.6  PHOS 7.2* 5.5*  --   --   --   CREATININE 8.85* 6.15* 6.56*  --  9.20*  K 4.8 3.8 3.7 3.8 5.2*    Inpatient medications:  apixaban  5 mg Per Tube BID   bisacodyl  10 mg Rectal Once   Chlorhexidine Gluconate Cloth  6 each Topical Daily   docusate  100 mg Per Tube BID   famotidine  20 mg Per Tube BID   midodrine  5 mg Per Tube 3 times per day on Sunday Monday Wednesday Friday   mouth rinse  15 mL Mouth Rinse Q2H   polyethylene glycol  17 g Per Tube Daily    feeding supplement (OSMOLITE 1.5 CAL) 40 mL/hr at 03/01/23 1200   heparin sodium (porcine)     lidocaine 1 mg/min (03/01/23 1200)   norepinephrine (LEVOPHED) Adult infusion 10 mcg/min (03/01/23 1200)   acetaminophen (TYLENOL) oral liquid 160 mg/5 mL, fentaNYL (SUBLIMAZE) injection, fentaNYL (SUBLIMAZE) injection, heparin sodium (porcine), ipratropium-albuterol, midazolam, mouth rinse, prochlorperazine

## 2023-03-01 NOTE — Progress Notes (Signed)
 BP requirements worse since this am rounds Had large volume emesis earlier and since then BP requirements worse. Also a little slower to wake up but this could be because of compazine her received for the vomiting.  Reviewed labs Bicarb still OK K now 5.2 (was 3.8) Lactic acid cleared  PCT 14.19 Plan I already ordered resp culture Will add HCAP coverage  Nephrology being notified by nursing staff if chem I've already placed a femoral central line I think he will also require CRRT soon  Simonne Martinet ACNP-BC [facility] Pulmonary/Critical Care Pager # [phone] OR # [phone] if no answer

## 2023-03-01 NOTE — Progress Notes (Signed)
 Nursing reports projectile vomiting -Bowel sounds are present but has not had a bowel movement since the 17th Plan 1 view abdomen Dulcolax suppository Unfortunately cannot add Reglan given QTc concerns As needed Compazine

## 2023-03-01 NOTE — Progress Notes (Signed)
 Nutrition Follow-up  DOCUMENTATION CODES:   Not applicable  INTERVENTION:  Re-initiate tube feeding via OGT: Osmolite 1.5 at  3ml/h AND HOLD Prosource TF20 60 ml daily  NUTRITION DIAGNOSIS:  Inadequate oral intake related to inability to eat as evidenced by NPO status. - remains applicable  GOAL:  Patient will meet greater than or equal to 90% of their needs - progressing  MONITOR:  Diet advancement, TF tolerance, Labs  REASON FOR ASSESSMENT:  Consult Assessment of nutrition requirement/status  ASSESSMENT:  Patient admitted to [facility] on [facility]23 with c/o SOB after shortened HD tx. Was slated to discharge 2023/03/16, but concerned about transport 2/2 weather conditions. Received another HD tx evening of 03/16/2023 and coded half way into tx. Transferred to 2H, thereafter. PMH: HTN, HLD, CVA (L sided deficits), PVD, PAF, HFrEF, severe MR, COPD, T2DM, ESRD-HD.  2023-03-14 admitted for SOB 2023/03/16 coded on HD tx, intubated, OGT placed 2/20 TF initiated/advanced 2/21 emesis, TF held 2/22 trickles re-initiated overnight  Patient failed weaning trial. GOC meeting pending as HCPOA is out of the country. He is slated to start CRRT tomorrow as well. This will increase protein needs for patient going forward.    Patient with significant emesis this morning. TF being held. Spoke to attending via secure chat. Willing to re-initiate trickles tonight and hold. Noted with no BM since 2023-03-14. Suppository and laxative ordered. Continues on docusate and Miralax. Imaging shows no ileus or bowel obstruction. Notable for "rounded calcifications consistent with probable polycystic kidney disease."   Admit Weight: 68kg Current Weight: 70.2kg EDW: 67.5kg  Net IO Since Admission: -717.17 mL [03/01/23 1618]   Non-pitting edema noted to BLEs. Last full HD tx on 2/18 with UF 2.3L.    Drains/Lines: OGT (proximal fundal - gastric) TDC R femoral cath  Meds: bisacodyl, docusate, famotidine, Miralax, IV ABX Drips: Levophed @  18mcg/min  Labs:  Na+ 133>132 (L) K+ 5.2 (H) BUN 29>43 (H) Crt 6.15>6.56>9.20 (H)  Diet Order:   Diet Order             Diet NPO time specified  Diet effective now           Diet - low sodium heart healthy             EDUCATION NEEDS:  Not appropriate for education at this time  Skin:  Skin Assessment: Reviewed RN Assessment  Last BM:  Mar 14, 2023  Height:  Ht Readings from Last 1 Encounters:  02/28/23 5\' 7"  (1.702 m)   Weight:  Wt Readings from Last 1 Encounters:  02/28/23 70.2 kg   Ideal Body Weight:  67.3 kg  BMI:  Body mass index is 24.24 kg/m.  Estimated Nutritional Needs:   Kcal:  2000-2200kcal  Protein:  130-145g  Fluid:  1L + UOP  Myrtie Cruise MS, RD, LDN Registered Dietitian Clinical Nutrition RD Inpatient Contact Info in Amion

## 2023-03-01 NOTE — Procedures (Signed)
 Central Venous Catheter Insertion Procedure Note  Nathaniel White  [id number]  September 03, 1960  Date:03/01/23  Time:1:30 PM   Provider Performing:Pete Bea Laura Tanja Port   Procedure: Insertion of Non-tunneled Central Venous Catheter(36556) with US guidance (13244)   Indication(s)  Difficult access  Consent Unable to obtain consent due to emergent nature of procedure.  Anesthesia Topical only with 1% lidocaine   Timeout Verified patient identification, verified procedure, site/side was marked, verified correct patient position, special equipment/implants available, medications/allergies/relevant history reviewed, required imaging and test results available.  Sterile Technique Maximal sterile technique including full sterile barrier drape, hand hygiene, sterile gown, sterile gloves, mask, hair covering, sterile ultrasound probe cover (if used).  Procedure Description Area of catheter insertion was cleaned with chlorhexidine and draped in sterile fashion.  With real-time ultrasound guidance a central venous catheter was placed into the right femoral vein. Nonpulsatile blood flow and easy flushing noted in all ports.  The catheter was sutured in place and sterile dressing applied.  Complications/Tolerance None; patient tolerated the procedure well. Chest X-ray is ordered to verify placement for internal jugular or subclavian cannulation.   Chest x-ray is not ordered for femoral cannulation.  EBL Minimal  Specimen(s) None

## 2023-03-02 ENCOUNTER — Other Ambulatory Visit: Payer: Self-pay

## 2023-03-02 ENCOUNTER — Inpatient Hospital Stay (HOSPITAL_COMMUNITY): Payer: Medicare Other

## 2023-03-02 ENCOUNTER — Encounter (HOSPITAL_COMMUNITY): Payer: Self-pay | Admitting: Internal Medicine

## 2023-03-02 DIAGNOSIS — Z7189 Other specified counseling: Secondary | ICD-10-CM

## 2023-03-02 DIAGNOSIS — Z992 Dependence on renal dialysis: Secondary | ICD-10-CM | POA: Diagnosis not present

## 2023-03-02 DIAGNOSIS — E440 Moderate protein-calorie malnutrition: Secondary | ICD-10-CM

## 2023-03-02 DIAGNOSIS — J9601 Acute respiratory failure with hypoxia: Secondary | ICD-10-CM | POA: Diagnosis not present

## 2023-03-02 DIAGNOSIS — Z515 Encounter for palliative care: Secondary | ICD-10-CM | POA: Diagnosis not present

## 2023-03-02 DIAGNOSIS — I469 Cardiac arrest, cause unspecified: Secondary | ICD-10-CM | POA: Diagnosis not present

## 2023-03-02 DIAGNOSIS — Z66 Do not resuscitate: Secondary | ICD-10-CM

## 2023-03-02 DIAGNOSIS — I429 Cardiomyopathy, unspecified: Secondary | ICD-10-CM

## 2023-03-02 DIAGNOSIS — E8770 Fluid overload, unspecified: Secondary | ICD-10-CM | POA: Diagnosis not present

## 2023-03-02 DIAGNOSIS — D649 Anemia, unspecified: Secondary | ICD-10-CM

## 2023-03-02 DIAGNOSIS — N186 End stage renal disease: Secondary | ICD-10-CM | POA: Diagnosis not present

## 2023-03-02 LAB — RENAL FUNCTION PANEL
Albumin: 2.1 g/dL — ABNORMAL LOW (ref 3.5–5.0)
Albumin: 2.1 g/dL — ABNORMAL LOW (ref 3.5–5.0)
Anion gap: 14 (ref 5–15)
Anion gap: 11 (ref 5–15)
BUN: 28 mg/dL — ABNORMAL HIGH (ref 6–20)
BUN: 22 mg/dL — ABNORMAL HIGH (ref 6–20)
CO2: 22 mmol/L (ref 22–32)
CO2: 23 mmol/L (ref 22–32)
Calcium: 9.4 mg/dL (ref 8.9–10.3)
Calcium: 9 mg/dL (ref 8.9–10.3)
Chloride: 98 mmol/L (ref 98–111)
Chloride: 99 mmol/L (ref 98–111)
Creatinine, Ser: 5.44 mg/dL — ABNORMAL HIGH (ref 0.61–1.24)
Creatinine, Ser: 3.44 mg/dL — ABNORMAL HIGH (ref 0.61–1.24)
GFR, Estimated: 11 mL/min — ABNORMAL LOW (ref >60)
GFR, Estimated: 20 mL/min — ABNORMAL LOW (ref >60)
Glucose, Bld: 175 mg/dL — ABNORMAL HIGH (ref 70–99)
Glucose, Bld: 160 mg/dL — ABNORMAL HIGH (ref 70–99)
Phosphorus: 4.6 mg/dL (ref 2.5–4.6)
Phosphorus: 3.7 mg/dL (ref 2.5–4.6)
Potassium: 5.1 mmol/L (ref 3.5–5.1)
Potassium: 4.9 mmol/L (ref 3.5–5.1)
Sodium: 134 mmol/L — ABNORMAL LOW (ref 135–145)
Sodium: 133 mmol/L — ABNORMAL LOW (ref 135–145)

## 2023-03-02 LAB — LIDOCAINE LEVEL: Lidocaine Lvl: 7.7 ug/mL (ref 1.5–5.0)

## 2023-03-02 LAB — BRAIN NATRIURETIC PEPTIDE: B Natriuretic Peptide: 2476.7 pg/mL — ABNORMAL HIGH (ref 0.0–100.0)

## 2023-03-02 LAB — CBC
HCT: 37.2 % — ABNORMAL LOW (ref 39.0–52.0)
Hemoglobin: 11 g/dL — ABNORMAL LOW (ref 13.0–17.0)
MCH: 24.4 pg — ABNORMAL LOW (ref 26.0–34.0)
MCHC: 29.6 g/dL — ABNORMAL LOW (ref 30.0–36.0)
MCV: 82.7 fL (ref 80.0–100.0)
Platelets: 171 10*3/uL (ref 150–400)
RBC: 4.5 MIL/uL (ref 4.22–5.81)
RDW: 18.3 % — ABNORMAL HIGH (ref 11.5–15.5)
WBC: 11 10*3/uL — ABNORMAL HIGH (ref 4.0–10.5)
nRBC: 0 % (ref 0.0–0.2)

## 2023-03-02 LAB — GLUCOSE, CAPILLARY
Glucose-Capillary: 138 mg/dL — ABNORMAL HIGH (ref 70–99)
Glucose-Capillary: 178 mg/dL — ABNORMAL HIGH (ref 70–99)
Glucose-Capillary: 122 mg/dL — ABNORMAL HIGH (ref 70–99)
Glucose-Capillary: 116 mg/dL — ABNORMAL HIGH (ref 70–99)

## 2023-03-02 LAB — PROCALCITONIN: Procalcitonin: 4.79 ng/mL

## 2023-03-02 LAB — MAGNESIUM: Magnesium: 2.5 mg/dL — ABNORMAL HIGH (ref 1.7–2.4)

## 2023-03-02 MED ORDER — MEXILETINE HCL 200 MG PO CAPS
200.0000 mg | ORAL_CAPSULE | Freq: Three times a day (TID) | ORAL | Status: DC
  Administered 2023-03-02 – 2023-03-09 (×22): 200 mg
  Filled 2023-03-02 (×23): qty 1

## 2023-03-02 MED ORDER — INSULIN ASPART 100 UNIT/ML IJ SOLN
0.0000 [IU] | INTRAMUSCULAR | Status: DC
  Administered 2023-03-02 – 2023-03-07 (×10): 1 [IU] via SUBCUTANEOUS

## 2023-03-02 NOTE — Progress Notes (Signed)
 NAME:  Nathaniel White, MRN:  [id number], DOB:  14-May-1960, LOS: 4 ADMISSION DATE:  02/25/2023 CONSULTATION DATE:  02/28/2023 REFERRING MD:  Jerral Ralph - TRH,  CHIEF COMPLAINT:  Cardiac arrest   History of Present Illness:  63 year old man who presented to [facility] 2/17 for SOB after truncated HD session (last 2/15). PMHx significant for HTN, HLD, CVA (with L-sided deficits), PVD, PAF (on Eliquis), HFrEF with NICM, severe MR, COPD (on 2-3L HOT), T2DM, ESRD on HD TTS. Recent admission 1/27-1/29 for volume overload/pulmonary edema.   Patient was reportedly slated for discharge 2/19 but remained in-house due to weather conditions and concern he would not be able to to have transport to HD 2/20; HD was completed overnight 2/19-2/20. While in HD 2/20AM, Code Blue was called for VF arrest. CPR x 4 minutes with Epi x 1, amio 150mg  and 300mg , lidocaine x 1, Mg/Ca repletion. ROSC was achieved and patient was awake and alert, repeatedly saying "help me" and "I can't breathe". NRB was attempted with ongoing extremis. On two additional occasions, patient rhythm deteriorated into monomorphic VT and he was shocked x 2. Decision was made to intubate.  PCCM consulted for ICU transfer and management.  Pertinent Medical History:   Past Medical History:  Diagnosis Date   Anemia    Anxiety    Asthma    Complication from renal dialysis device 11/02/2013   Complication of anesthesia 2017   according to pt and spouse pt was moving around and cough while under and pt had difficulty waking up so the anesthesia had to be reversed. and pt admitted to ICU.    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/2008   Qualifier: Diagnosis of  By: Russella Dar MD Marylu Lund    ESRD    on HD, T-TH-Sat - [place]    Hemiparesis due to old stroke [facility])    left   HFrEF (heart failure with reduced ejection fraction) (HCC)    NICM // TTE 06/2022: EF 25-30, sever RV dysfunction, mod to severe MR   Hypertension    Hx, not current problems, no meds   LV dysfunction    EF 25-30% by echo 07/2011   Memory loss due to medical condition    due to stroke   MR (mitral regurgitation)    moderate to severe, echo 07/2011   Nausea with vomiting, unspecified 06/15/2021   Paroxysmal atrial fibrillation (HCC)    Peripheral vascular disease (HCC)    Shortness of breath    with exertion   Stroke [facility])    TIA's-left sided weakness   Tobacco abuse     Significant Hospital Events: Including procedures, antibiotic start and stop dates in addition to other pertinent events   2/20 - Cardiac arrest in HD, initial rhythm VF with CPR x 2 rounds, Epi/Mg/Ca, shocked x 2; after ROSC, monomorphic VT requiring shock x 2. Intubated at bedside in dialysis. Transferred to [facility] CV[facility]. Cardiology consulted. NE weaned off during day   Interim History / Subjective:  Remains on vaso, norepi, CRRT. Not able to tolerate pulling on CRRT due to hypotension. Failed SBT due to low Vt despite PS 15.  Objective:  Blood pressure (!) 58/49, pulse 75, temperature 98 F (36.7 C), temperature source Axillary, resp. rate 18,  height 5\' 7"  (1.702 m), weight 70.2 kg, SpO2 100%.    Vent Mode: PRVC FiO2 (%):  [40 %] 40 % Set Rate:  [18 bmp-22 bmp] 18 bmp Vt Set:  [530 mL] 530 mL PEEP:  [5 cmH20] 5 cmH20 Plateau Pressure:  [22 cmH20-30 cmH20] 22 cmH20   Intake/Output Summary (Last 24 hours) at 03/02/2023 1022 Last data filed at 03/02/2023 1000 Gross per 24 hour  Intake 2787.34 ml  Output 1062 ml  Net 1725.34 ml   Filed Weights   02/26/23 1314 02/28/23 0630  Weight: 68 kg 70.2 kg   Physical Examination: General critically ill appearing man lying in bed in NAD on CRRT, MV HEENT  [place]/AT, eyes anicteric, ETT tube in place Pulmonary faint rhales, no  rhonchi, Pplat 28 Cardiac S1S2, RRR Abdomen soft, NT Extremities muscle wasting, mild edema Neuro awake, globally weak, nodding to answer questions   Na+ 134 BUN 28 Cr 5.44 BNP 2477 PCT 4.79 WBC 11 H/H 11/37.2 Platelets 171 CXR personally reviewed> LLL infiltrate, smaller RLL infiltrate. Small dependent R effusion. Rib fractures.    Resolved Hospital Problem List:  Cardiogenic shock  VT/VF in-hospital cardiac arrest 2/20 Post cardiac arrest lactic acidosis  Assessment & Plan:    End-stage NICM (EF 30-35%) w/ chronic hypotension; intolerant to GDMT due to hypotension and renal failure Paroxysmal Afib and prolonged Qtc VT s/p in-hospital cardiac arrest 2/20 (TIME TO rosc 4 MIN) -due to concern for lidocaine toxicity, switching lidocaine to mexilitine -avoid Qtc prolonging meds; not a candidate for amiodarone -monitor electrolytes; CRRT to manage this -eliquis BID -con't PTA midodrine  Presumed septic shock- aspiration pneumonia -vanc, zosyn -follow culture -stress dose steroids -pressors to maintain AMP >65; NE & vaso currently  Acute hypoxemic respiratory failure following VF arrest w/ bilateral airspace disease most likely reflecting pulmonary edema  Likely aspiration pneumonia from cardiac arrest -LTVV -daily SAT & SBT; failed today due to low volumes, likely NM weakness -PAD protocol -VAP prevention protocol -needs volume off -empiric pneumonia antibiotics- vanc, zosyn. Follow trach aspirate culture.  ESRD on HD TTS; PCKD Hypervolemic hyponatremia -CRRT -strict I/O -renally dose meds, avoid nephrotoxic meds   T2DM w/ hyperglycemia -SSI PRN -goal BG 140-180  Moderate protein energy malnutrition; previously held for vomiting -TF -anti-emetics for vomiting  Constipation, improved -con't bowel regimen  Anemia -transfuse for Hb <7 or hemodynamically significant bleeding  See ipal note for family update.  Best Practice: (right click and "Reselect  all SmartList Selections" daily)   Diet/type: tubefeeds DVT prophylaxis: DOAC GI prophylaxis: H2B Lines: Central line, Dialysis Catheter, and Arterial Line Foley:  N/A Code Status:  full code Last date of multidisciplinary goals of care discussion [2/22 with palliative care]  Critical care time: 40 min.    Steffanie Dunn, DO 03/02/23 2:14 PM [facility]  For contact information, see Amion. If no response to pager, please call PCCM consult pager. After hours, 7PM- 7AM, please call Elink.

## 2023-03-02 NOTE — Progress Notes (Signed)
 [facility] Progress Note   Subjective:  CRRT started yesterday no new issues palliative care meeting is planned for today   Objective Vitals:   03/02/23 1000 03/02/23 1015 03/02/23 1018 03/02/23 1030  BP: (!) 112/92 (!) 58/49    Pulse:   64   Resp: (!) 21 18 18 18   Temp:      TempSrc:      SpO2:   100%   Weight:      Height:       Physical Exam on vent ,sedated, chronically ill appearing  no jvd  throat ett in place  Chest cta bilat and lat  Cor reg no RG  Abd soft ntnd no ascites   Ext trace bilat ankle edema   Neuro on vent and sedated, not following commands  OP HD: SW TTS  4h  B400   67.5kg   2K bath  TDC   Heparin none - last HD 2/15, post wt 69kg, coming off 0.5- 1.5 kg over - good attendance, rarely stays on past 3.5 hrs - hectorol 3 mcg iv three times per week - parsabiv 7.5mg       CXR 2/17 - IMPRESSION: vascular congestion and probable mild interstitial edema. Small left effusion. 2. Chronic pleural and parenchymal scarring on the right. Similar peripheral bandlike opacities in the left mid to lower lung which could be due to chronic scarring or atelectasis.    Assessment/ Plan: Acute on chronic hypoxic resp failure - had HD 2/18 w/ 2.3 L UF, and SOB improved. On vent now after cardiac arrest on 2.20. Marland Kitchen  Cardiac arrest / fib and VT) - occurred 2/20 while on dialysis. Seen by cardiology as below. Getting IV lidocaine.  Shock - post arrest, on levo gtt at 20 today End-stage biventricular CHF / NICM - EF was 20% then improved to 35%. No advanced options per cardiology. Was seen by Palliative care during 06/2022 admit ESRD - on HD TTS. Had 2 hrs HD prior to code 2/20. Worsening shock prompted CRRT which was started on 2/21. A timed course of CRRT might be a good option given severe comorbidities.  Secondary hyperparathyroidism - CCa 10.7 Holding hectorol. Phos high, cont binders Anemia of esrd - hemoglobins are stable, no esa needs.  COPD -  on home O2 2.5- 3 L [place] H/o CVA PAF  Nathaniel Moselle  MD  [facility] 03/02/2023, 10:39 AM  Recent Labs  Lab 03/01/23 1059 03/01/23 2025 03/02/23 0246  HGB 11.0*  --  11.0*  ALBUMIN 2.2* 2.1* 2.1*  CALCIUM 9.6 9.0 9.4  PHOS  --  4.7* 4.6  CREATININE 9.20* 7.18* 5.44*  K 5.2* 5.1 5.1    Inpatient medications:  apixaban  5 mg Per Tube BID   Chlorhexidine Gluconate Cloth  6 each Topical Daily   docusate  100 mg Per Tube BID   famotidine  20 mg Per Tube Daily   feeding supplement (PROSource TF20)  60 mL Per Tube Daily   hydrocortisone sod succinate (SOLU-CORTEF) inj  100 mg Intravenous Q12H   insulin aspart  0-6 Units Subcutaneous Q4H   mexiletine  200 mg Per Tube Q8H   midodrine  5 mg Per Tube 3 times per day on Sunday Monday Wednesday Friday   mouth rinse  15 mL Mouth Rinse Q2H   polyethylene glycol  17 g Per Tube Daily    sodium chloride Stopped (03/02/23 0740)   feeding supplement (OSMOLITE 1.5 CAL) 20 mL/hr at 03/02/23 1000  heparin sodium (porcine)     norepinephrine (LEVOPHED) Adult infusion 20 mcg/min (03/02/23 1000)   piperacillin-tazobactam Stopped (03/02/23 0628)   prismasol BGK 4/2.5 400 mL/hr at 03/02/23 0424   prismasol BGK 4/2.5 400 mL/hr at 03/02/23 0424   prismasol BGK 4/2.5 1,400 mL/hr at 03/02/23 1011   vancomycin     vasopressin 0.03 Units/min (03/02/23 1000)   sodium chloride, acetaminophen (TYLENOL) oral liquid 160 mg/5 mL, fentaNYL (SUBLIMAZE) injection, fentaNYL (SUBLIMAZE) injection, heparin, heparin sodium (porcine), ipratropium-albuterol, midazolam, mouth rinse, prochlorperazine

## 2023-03-02 NOTE — Progress Notes (Signed)
 Daily Progress Note   Patient Name: Nathaniel White       Date: 03/02/2023 DOB: 11-29-1960  Age: 63 y.o. MRN#: [id number] Attending Physician: Steffanie Dunn, DO Primary Care Physician: Claiborne Rigg, NP Admit Date: 02/25/2023  Reason for Consultation/Follow-up: Establishing goals of care  Subjective: I have reviewed medical records including EPIC notes, MAR, any available advanced directives as necessary, and labs. CRRT started yesterday - unable to remove fluid due to hypotension. With increased pressor requirements compared to yesterday. Emesis yesterday with tube feeds - TF held and re-started at trickle rate today. Failed SBT. Received report from primary RN - no acute concerns. Per RN, patient following commands today.   Went to visit patient at bedside - no family/visitors present; primary RN present providing care. Patient was lying in bed - he remains intubated, no longer on continuous sedation. He remains ill and frail appearing. No signs or non-verbal gestures of pain or discomfort noted. No respiratory distress, increased work of breathing, or secretions noted.   11:30 AM Called sister/HCPOA/[name] for scheduled GOC meeting - Marylene Land notes several family members are on their way to the hospital now. She requests to complete GOC with family. Rescheduled meeting for 12:30p.  12:30 PM Arrived to bedside - no family/visitors present.  1:05 PM Met with patient's niece/Jania Steinke, son/Will and his girlfriend/Deanna, father/Shakim, mother/Ellen, aunt/Mimie, cousin/Teresa, and cousin/Michael in person as well as sister/HCPOA/[name] via facetime to discuss diagnosis, prognosis, GOC, EOL wishes, disposition, and options.  Emotional support provided to family.  I introduced Palliative Medicine  as specialized medical care for people living with serious illness. It focuses on providing relief from the symptoms and stress of a serious illness. The goal is to improve quality of life for both the patient and the family.  We discussed a brief life review of the patient as well as functional and nutritional status. Patient is not married - he has one son/Will. Prior to hospitalization, he was living in a private residence with 2 roommates. One roommate acted as his primary caregiver. Patient was wheelchair bound - he needed assistance with transfers, dressing/bathing, cooking. Family agree that patient had a poor baseline functional status.  Dr. Chestine Spore joined meeting to provide medical updates. Patient's current acute medical situation was discussed in detail: inability to tolerate iHD/remove fluid in context of  hypotension and advanced heart disease, unable to wean from MV due to excess fluid, education on CRRT and intubation as as short term interventions, increased pressor requirements. Family understand that patient's status is getting worse despite aggressive medical interventions. Dr. Chestine Spore answered all questions.  We discussed patient's current illness and what it means in the larger context of patient's on-going co-morbidities. Family have a clear understanding of his situation.  Natural disease trajectory and expectations at EOL were discussed. I attempted to elicit values and goals of care important to the patient. The difference between aggressive medical intervention and comfort care was considered in light of the patient's goals of care.   We talked about transition to comfort measures in house and what that would entail inclusive of medications to control pain, dyspnea, agitation, nausea, and itching. We discussed stopping all unnecessary measures such as blood draws, needle sticks, oxygen, antibiotics, CBGs/insulin, cardiac monitoring, IVF, and frequent vital signs. Education provided that  other non-pharmacological interventions would be utilized for holistic support and comfort such as spiritual support if requested, repositioning, music therapy, offering comfort feeds, and/or therapeutic listening. All care would focus on how the patient is looking and feeling.   Allowed space and time for family to discuss information given to them today. Per [name]/HCPOA - patient had indicated he wanted every chance to live, but if his situation was not curable, he would not want aggressive care. Discussed this in context of patient's current Living Will document outlining his desire for natural death.  Family opt for continued full scope treatment for several more days, allowing time for outcomes and watchful waiting. Family will make stepwise decisions pending his clinical course. Encouraged ongoing discussions - another family meeting was scheduled for Monday 2/24 at 12p for ongoing GOC.   Encouraged patient/family to consider DNR/DNI status understanding evidenced based poor outcomes in similar hospitalized patient, as the cause of arrest is likely associated with advanced chronic/terminal illness rather than an easily reversible acute cardio-pulmonary event.  I shared that even if we pursued resuscitation we would not able to resolve the underlying factors. I explained that DNR/DNI does not change the medical plan and it only comes into effect after a person has arrested (died).  It is a protective measure to keep [place] from harming the patient in their last moments of life. Family opt for DNR with understanding that he would not receive CPR, defibrillation, or ACLS medications (he is already intubated).   Discussed with family the importance of continued conversation with each other and the medical providers regarding overall plan of care and treatment options, ensuring decisions are within the context of the patient's values and GOCs.    Questions and concerns were addressed. The patient/family was  encouraged to call with questions and/or concerns. PMT card was provided.  Printed and provided family with 3 copies of patient's HCPOA and Living Will for them to review and discuss per their request.   Length of Stay: 4  Current Medications: Scheduled Meds:   apixaban  5 mg Per Tube BID   Chlorhexidine Gluconate Cloth  6 each Topical Daily   docusate  100 mg Per Tube BID   famotidine  20 mg Per Tube Daily   feeding supplement (PROSource TF20)  60 mL Per Tube Daily   hydrocortisone sod succinate (SOLU-CORTEF) inj  100 mg Intravenous Q12H   insulin aspart  0-6 Units Subcutaneous Q4H   mexiletine  200 mg Per Tube Q8H   midodrine  5 mg Per  Tube 3 times per day on Sunday Monday Wednesday Friday   mouth rinse  15 mL Mouth Rinse Q2H   polyethylene glycol  17 g Per Tube Daily    Continuous Infusions:  sodium chloride Stopped (03/02/23 0740)   feeding supplement (OSMOLITE 1.5 CAL) 20 mL/hr at 03/02/23 1100   heparin sodium (porcine)     norepinephrine (LEVOPHED) Adult infusion 23 mcg/min (03/02/23 1100)   piperacillin-tazobactam Stopped (03/02/23 0628)   prismasol BGK 4/2.5 400 mL/hr at 03/02/23 0424   prismasol BGK 4/2.5 400 mL/hr at 03/02/23 0424   prismasol BGK 4/2.5 1,400 mL/hr at 03/02/23 1011   vancomycin     vasopressin 0.03 Units/min (03/02/23 1100)    PRN Meds: sodium chloride, acetaminophen (TYLENOL) oral liquid 160 mg/5 mL, fentaNYL (SUBLIMAZE) injection, fentaNYL (SUBLIMAZE) injection, heparin, heparin sodium (porcine), ipratropium-albuterol, midazolam, mouth rinse, prochlorperazine  Physical Exam Vitals and nursing note reviewed.  Constitutional:      General: He is not in acute distress.    Appearance: He is ill-appearing.     Interventions: He is intubated.  Pulmonary:     Effort: No respiratory distress. He is intubated.  Skin:    General: Skin is warm and dry.  Neurological:     Motor: Weakness present.             Vital Signs: BP (!) 115/45   Pulse 64    Temp 98 F (36.7 C) (Axillary)   Resp 18   Ht 5\' 7"  (1.702 m)   Wt 70.2 kg   SpO2 100%   BMI 24.24 kg/m  SpO2: SpO2: 100 % O2 Device: O2 Device: Ventilator O2 Flow Rate: O2 Flow Rate (L/min): 3 L/min  Intake/output summary:  Intake/Output Summary (Last 24 hours) at 03/02/2023 1107 Last data filed at 03/02/2023 1100 Gross per 24 hour  Intake 2782.01 ml  Output 1117 ml  Net 1665.01 ml   LBM: Last BM Date : 02/25/23 Baseline Weight: Weight:  (In ER stretcher that is not zero'd out) Most recent weight: Weight: 70.2 kg       Palliative Assessment/Data: PPS 30% with tube feeds      Patient Active Problem List   Diagnosis Date Noted   On mechanically assisted ventilation (HCC) 03/01/2023   Aspiration pneumonia of both lower lobes due to gastric secretions (HCC) 03/01/2023   Shock (HCC) 03/01/2023   Cardiac arrest (HCC) 02/28/2023   Fluid overload 02/26/2023   Dyspnea 02/05/2023   Acute exacerbation of CHF (congestive heart failure) (HCC) 01/15/2023   CAP (community acquired pneumonia) 01/15/2023   Hyperkalemia 01/15/2023   Chronic ITP (idiopathic thrombocytopenia) (HCC) 01/15/2023   Acute hypoxic respiratory failure (HCC) 01/15/2023   Respiratory failure (HCC) 01/15/2023   Hypokalemia 12/28/2022   Prolonged QT interval 12/28/2022   Chronic hypoxic respiratory failure (HCC) 12/28/2022   Chronic systolic CHF (congestive heart failure) (HCC) 12/28/2022   Elevated troponin 12/28/2022   Acute bronchitis due to Rhinovirus 12/28/2022   First degree heart block 09/17/2022   Puncture wound of left knee with complication 09/15/2022   Open wound of penis with complication 09/15/2022   Sepsis secondary to penile infection 09/15/2022   Diabetes mellitus without complication (HCC) 09/15/2022   Encounter for screening for respiratory tuberculosis 08/20/2022   Mitral regurgitation 07/03/2022   Ventricular tachycardia, unspecified (HCC) 06/20/2022   Palpitations 06/19/2022    Volume overload 09/14/2021   COPD with acute exacerbation [facility])    Pulmonary embolus, right (HCC) 09/07/2021   Severe pulmonary hypertension (HCC) 09/07/2021  End-stage renal disease on hemodialysis (HCC) 09/07/2021   Diabetes mellitus type 2, controlled, without complications (HCC) 09/07/2021   Anemia of chronic disease 09/07/2021   SOB (shortness of breath) 09/04/2021   History of pulmonary embolism 09/04/2021   Pulmonary embolism (HCC) 09/04/2021   NSVT (nonsustained ventricular tachycardia) (HCC) 06/26/2021   Hypoglycemia 06/24/2021   Cirrhosis (HCC) 06/23/2021   Confusion 06/22/2021   Necrotizing pneumonia (HCC) 06/21/2021   CVA (cerebral vascular accident) (HCC) 06/21/2021   Paroxysmal atrial fibrillation (HCC) 06/21/2021   Chronic hypotension 06/21/2021   Unusual Appearance with Innumerable Lesions on CT scan, needs MRI 06/21/2021   Aortic atherosclerosis (HCC) 06/21/2021   ESRD (end stage renal disease) (HCC) 06/20/2021   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 (HCC) 06/20/2021   Nausea with vomiting, unspecified 06/15/2021   Acute on chronic hypoxic respiratory failure (HCC) 03/10/2021   Shock circulatory (HCC) 03/10/2021   Hemodialysis-associated hypotension 03/10/2021   Finger infection    Paronychia of finger, left 02/03/2021   Left hand pain 12/29/2020   Allergy, unspecified, initial encounter 09/28/2019   Anaphylactic shock, unspecified, initial encounter 09/28/2019   Pressure ulcer of ankle 09/01/2018   Thrombocytopenia (HCC) 01/17/2015   Transfusion history 01/17/2015   Hypercalcemia 11/01/2014   Dependence on renal dialysis (HCC) 03/26/2014   Encounter for fitting and adjustment of extracorporeal dialysis catheter (HCC) 03/26/2014   Diarrhea, unspecified 01/03/2014   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/10/2013   Pain, unspecified 09/23/2013   Cough 09/07/2013   HFrEF (heart failure with reduced ejection fraction) (HCC)  08/14/2013   Coagulation defect, unspecified (HCC) 12/15/2012   Moderate protein-calorie malnutrition (HCC) 08/07/2012   Restrictive lung disease 07/06/2012   History of CVA (cerebrovascular accident) 08/13/2011   ESRD on dialysis (HCC) 03/13/2011   HTN (hypertension) 03/13/2011   H/O 03/13/2011   Anemia 03/13/2011   Fluid overload, unspecified 03/13/2011   History of nonadherence to medical treatment 03/13/2011   Non-insulin dependent type 2 diabetes mellitus (HCC) 03/13/2011   Tobacco abuse 03/13/2011   Kidney transplant status 11/28/2008   HEMATOCHEZIA 10/04/2008   Iron deficiency anemia, unspecified 03/31/2003   Patient's noncompliance with other medical treatment and regimen 03/31/2003   Personal history of nicotine dependence 03/31/2003   Secondary hyperparathyroidism of renal origin (HCC) 03/31/2003    Palliative Care Assessment & Plan   Patient Profile: 63 y.o. male  with past medical history of ESRD on HD TTS, COPD, chronic hypoxemic respiratory failure on 2.5 L home oxygen, chronic HFrEF (EF 35-40% 12/2022), severe mitral regurgitation, paroxysmal A-fib on Eliquis, chronic hypotension on midodrine, chronic anemia, PVD, CVA/TIA was admitted on 02/25/2023 with acute on chronic hypoxemic respiratory failure secondary to volume overload/pulmonary edema in setting of ESRD on HD, acute on chronic HFrEF and known severe mitral regurgitation, hypocalcemia, metabolic acidosis, elevated troponin, QT prolongation, end stage NICM (EF 30-35%) with chronic hypotension. His hospital course was complicated by code blue event on 2/21 - during HD patient went into VF arrest while receiving HD. CPR x4 minutes with ACLS medications administered. ROSC was achieved; however, on two additional occasions, patient rhythm deteriorated into monomorphic VT and he was shocked x 2. Decision was made to intubate.   Assessment: Principal Problem:   Fluid overload Active Problems:   ESRD on dialysis (HCC)    Acute on chronic hypoxic respiratory failure (HCC)   Paroxysmal atrial fibrillation (HCC)   Volume overload   Acute exacerbation of CHF (congestive heart failure) (HCC)   Cardiac arrest (HCC)  On mechanically assisted ventilation (HCC)   Aspiration pneumonia of both lower lobes due to gastric secretions (HCC)   Shock (HCC)   Recommendations/Plan: Continue full scope care with watchful waiting Now DNR Family will make stepwise decisions pending patient's clinical course Follow up family meeting scheduled for Monday 2/24 at 12p for ongoing GOC PMT will continue to follow and support holistically  Goals of Care and Additional Recommendations: Limitations on Scope of Treatment: Full Scope Treatment  Code Status:    Code Status Orders  (From admission, onward)           Start     Ordered   02/26/23 0632  Full code  Continuous       Question:  By:  Answer:  Consent: discussion documented in EHR   02/26/23 0636           Code Status History     Date Active Date Inactive Code Status Order ID Comments User Context   02/05/2023 0254 02/06/2023 1844 Full Code [id number]  Darlin Drop, DO ED   01/15/2023 0909 01/19/2023 2138 Full Code [id number]  Minor, Vilinda Blanks, NP ED   01/15/2023 0450 01/15/2023 0909 Full Code [id number]  Tereasa Coop, MD ED   12/27/2022 2358 01/01/2023 1946 Full Code [id number]  Howerter, Chaney Born, DO ED   11/29/2022 1031 11/30/2022 2212 Full Code [id number]  Lurline Del, MD ED   09/15/2022 1243 09/24/2022 2333 Full Code [id number]  Alfredo Martinez, MD ED   06/22/2022 1739 06/28/2022 2159 DNR [id number]  Ernie Avena, NP Inpatient   06/20/2022 0033 06/22/2022 1739 Full Code [id number]  Joellen Jersey., MD ED   09/13/2021 2350 09/17/2021 2224 Full Code [id number]  Howerter, Chaney Born, DO ED   09/04/2021 2104 09/08/2021 1824 Full Code [id number]  Eduard Clos, MD Inpatient   06/20/2021 0942 06/26/2021 2359 Full Code [id number]  Graceann Congress, PA-C Inpatient    07/21/2018 1138 07/21/2018 1714 Full Code [id number]  Maeola Harman, MD Inpatient   11/10/2013 1812 11/11/2013 2126 Full Code [id number]  Raymond Gurney, PA-C Inpatient   10/09/2013 1343 10/09/2013 2026 Full Code [id number]  Kathleene Hazel, MD Inpatient   08/10/2011 1702 08/19/2011 1428 Full Code [id number]  Alonna Buckler, RN Inpatient   03/13/2011 0647 03/14/2011 1849 Full Code [id number]  Eduard Clos, MD Inpatient      Advance Directive Documentation    Flowsheet Row Most Recent Value  Type of Advance Directive Living will, Healthcare Power of Attorney  Pre-existing out of facility DNR order (yellow form or pink MOST form) --  "MOST" Form in Place? --       Prognosis:  Unable to determine  Discharge Planning: To Be Determined  Care plan was discussed with primary RN, Dr. Chestine Spore, patient's family  Thank you for allowing the Palliative Medicine Team to assist in the care of this patient.   Total Time 80 minutes Prolonged Time Billed  yes       Haskel Khan, NP  Please contact Palliative Medicine Team phone at [phone] for questions and concerns.   *Portions of this note are a verbal dictation therefore any spelling and/or grammatical errors are due to the "[employer]" system interpretation.

## 2023-03-02 NOTE — IPAL (Signed)
  Interdisciplinary Goals of Care Family Meeting   Date carried out:: 03/02/2023  Location of the meeting: Bedside  Member's involved: Physician, Family Member or next of kin, and Palliative care team member  Durable Power of Attorney or acting medical decision maker: son, parents, cousin, sister via video chat    Discussion: We discussed goals of care for Nathaniel White .  NP Amber from Palliative Care discussing goals with family. I provided a medical update-- on more pressors, now on CRRT due to intolerance of iHD, too much fluid on lungs to tolerate vent weaning, but not tolerating pulling on CRRT due to hypotension. He has advanced cardiac disease and no good options since he cannot tolerate GDMT at this point. My worry is his advanced cardiac and kidney disease and the possibility that his heart is getting too weak to tolerate dialysis. Options moving forward were discussed with Palliative Care.  Code status: deferred discussion to Palliative Care  Disposition: deferred discussion to Palliative Care   Time spent for the meeting: 10 min.  Steffanie Dunn 03/02/2023, 1:44 PM

## 2023-03-03 DIAGNOSIS — E8770 Fluid overload, unspecified: Secondary | ICD-10-CM | POA: Diagnosis not present

## 2023-03-03 DIAGNOSIS — J9601 Acute respiratory failure with hypoxia: Secondary | ICD-10-CM | POA: Diagnosis not present

## 2023-03-03 DIAGNOSIS — Z7189 Other specified counseling: Secondary | ICD-10-CM | POA: Diagnosis not present

## 2023-03-03 DIAGNOSIS — N186 End stage renal disease: Secondary | ICD-10-CM | POA: Diagnosis not present

## 2023-03-03 DIAGNOSIS — Z515 Encounter for palliative care: Secondary | ICD-10-CM | POA: Diagnosis not present

## 2023-03-03 DIAGNOSIS — Z992 Dependence on renal dialysis: Secondary | ICD-10-CM | POA: Diagnosis not present

## 2023-03-03 DIAGNOSIS — I469 Cardiac arrest, cause unspecified: Secondary | ICD-10-CM | POA: Diagnosis not present

## 2023-03-03 LAB — MAGNESIUM: Magnesium: 2.6 mg/dL — ABNORMAL HIGH (ref 1.7–2.4)

## 2023-03-03 LAB — RENAL FUNCTION PANEL
Albumin: 2.1 g/dL — ABNORMAL LOW (ref 3.5–5.0)
Albumin: 2 g/dL — ABNORMAL LOW (ref 3.5–5.0)
Anion gap: 9 (ref 5–15)
Anion gap: 15 (ref 5–15)
BUN: 22 mg/dL — ABNORMAL HIGH (ref 6–20)
BUN: 20 mg/dL (ref 6–20)
CO2: 24 mmol/L (ref 22–32)
CO2: 25 mmol/L (ref 22–32)
Calcium: 8.9 mg/dL (ref 8.9–10.3)
Calcium: 8.7 mg/dL — ABNORMAL LOW (ref 8.9–10.3)
Chloride: 99 mmol/L (ref 98–111)
Chloride: 95 mmol/L — ABNORMAL LOW (ref 98–111)
Creatinine, Ser: 2.63 mg/dL — ABNORMAL HIGH (ref 0.61–1.24)
Creatinine, Ser: 2.23 mg/dL — ABNORMAL HIGH (ref 0.61–1.24)
GFR, Estimated: 27 mL/min — ABNORMAL LOW (ref >60)
GFR, Estimated: 33 mL/min — ABNORMAL LOW (ref >60)
Glucose, Bld: 158 mg/dL — ABNORMAL HIGH (ref 70–99)
Glucose, Bld: 169 mg/dL — ABNORMAL HIGH (ref 70–99)
Phosphorus: 3.2 mg/dL (ref 2.5–4.6)
Phosphorus: 2.8 mg/dL (ref 2.5–4.6)
Potassium: 4.8 mmol/L (ref 3.5–5.1)
Potassium: 4.7 mmol/L (ref 3.5–5.1)
Sodium: 132 mmol/L — ABNORMAL LOW (ref 135–145)
Sodium: 135 mmol/L (ref 135–145)

## 2023-03-03 LAB — CBC
HCT: 38.5 % — ABNORMAL LOW (ref 39.0–52.0)
Hemoglobin: 11.2 g/dL — ABNORMAL LOW (ref 13.0–17.0)
MCH: 24.8 pg — ABNORMAL LOW (ref 26.0–34.0)
MCHC: 29.1 g/dL — ABNORMAL LOW (ref 30.0–36.0)
MCV: 85.2 fL (ref 80.0–100.0)
Platelets: 207 10*3/uL (ref 150–400)
RBC: 4.52 MIL/uL (ref 4.22–5.81)
RDW: 17.9 % — ABNORMAL HIGH (ref 11.5–15.5)
WBC: 10 10*3/uL (ref 4.0–10.5)
nRBC: 0 % (ref 0.0–0.2)

## 2023-03-03 LAB — GLUCOSE, CAPILLARY
Glucose-Capillary: 115 mg/dL — ABNORMAL HIGH (ref 70–99)
Glucose-Capillary: 117 mg/dL — ABNORMAL HIGH (ref 70–99)
Glucose-Capillary: 122 mg/dL — ABNORMAL HIGH (ref 70–99)
Glucose-Capillary: 132 mg/dL — ABNORMAL HIGH (ref 70–99)
Glucose-Capillary: 154 mg/dL — ABNORMAL HIGH (ref 70–99)
Glucose-Capillary: 157 mg/dL — ABNORMAL HIGH (ref 70–99)

## 2023-03-03 LAB — PROCALCITONIN: Procalcitonin: 3.57 ng/mL

## 2023-03-03 LAB — BRAIN NATRIURETIC PEPTIDE: B Natriuretic Peptide: >4500 pg/mL — ABNORMAL HIGH (ref 0.0–100.0)

## 2023-03-03 MED ORDER — PANTOPRAZOLE SODIUM 40 MG IV SOLR
40.0000 mg | INTRAVENOUS | Status: DC
  Administered 2023-03-03 – 2023-03-08 (×6): 40 mg via INTRAVENOUS
  Filled 2023-03-03 (×6): qty 10

## 2023-03-03 NOTE — Progress Notes (Signed)
 [facility] Progress Note   Subjective:  Pt was +460 cc yest in total per ICU RN Today so far they are able to keep him "even" palliative care mtg --> new DNR order Cont ICU care for now  Objective Vitals:   03/03/23 0645 03/03/23 0700 03/03/23 0735 03/03/23 0800  BP: (!) 109/31 106/70  (!) 109/24  Pulse:   90   Resp: 18 18 18 18   Temp:  (!) 97.4 F (36.3 C)    TempSrc:  Axillary    SpO2:   93%   Weight:      Height:       Physical Exam on vent ,sedated, chronically ill appearing  no jvd  throat ett in place  Chest cta bilat and lat  Cor reg no RG  Abd soft ntnd no ascites   Ext trace bilat ankle edema   Neuro on vent and sedated, not following commands  OP HD: SW TTS  4h  B400   67.5kg   2K bath  TDC   Heparin none - last HD 2/15, post wt 69kg, coming off 0.5- 1.5 kg over - good attendance, rarely stays on past 3.5 hrs - hectorol 3 mcg iv three times per week - parsabiv 7.5mg       CXR 2/17 - IMPRESSION: vascular congestion and probable mild interstitial edema. Small left effusion. 2. Chronic pleural and parenchymal scarring on the right. Similar peripheral bandlike opacities in the left mid to lower lung which could be due to chronic scarring or atelectasis.    Assessment/ Plan: Acute on chronic hypoxic resp failure - had HD 2/18 w/ 2.3 L UF, and SOB improved. On vent now after cardiac arrest on 2/20.   Cardiac arrest / fib and VT) - occurred 2/20 while on dialysis. Seen by cardiology as below. Getting IV lidocaine.  Shock - post arrest, on levo gtt at 26, and vaso 0.03.  ESRD - on HD TTS. Had 2 hrs HD prior to code 2/20. Worsening shock prompted CRRT which was started on 2/21. Cont CRRT. Patient's heart may not be strong enough to tolerate regular dialysis.  Secondary hyperparathyroidism - CCa 10.7 Holding hectorol. Phos high, cont binders Anemia of esrd - hemoglobins are stable, no esa needs.  End-stage bivent CHF - EF 20% then later was up  35%. No options per cards 06/2022  COPD - on home O2 2.5- 3 L [place] H/o CVA DNR - since yesterday  Vinson Moselle  MD  [facility] 03/03/2023, 8:28 AM  Recent Labs  Lab 03/02/23 0246 03/02/23 1602 03/03/23 0313  HGB 11.0*  --  11.2*  ALBUMIN 2.1* 2.1* 2.1*  CALCIUM 9.4 9.0 8.9  PHOS 4.6 3.7 3.2  CREATININE 5.44* 3.44* 2.63*  K 5.1 4.9 4.8    Inpatient medications:  apixaban  5 mg Per Tube BID   Chlorhexidine Gluconate Cloth  6 each Topical Daily   docusate  100 mg Per Tube BID   famotidine  20 mg Per Tube Daily   feeding supplement (PROSource TF20)  60 mL Per Tube Daily   hydrocortisone sod succinate (SOLU-CORTEF) inj  100 mg Intravenous Q12H   insulin aspart  0-6 Units Subcutaneous Q4H   mexiletine  200 mg Per Tube Q8H   midodrine  5 mg Per Tube 3 times per day on Sunday Monday Wednesday Friday   mouth rinse  15 mL Mouth Rinse Q2H   polyethylene glycol  17 g Per Tube Daily    feeding supplement (  OSMOLITE 1.5 CAL) 20 mL/hr at 03/03/23 0800   heparin sodium (porcine)     norepinephrine (LEVOPHED) Adult infusion 26.987 mcg/min (03/03/23 0800)   piperacillin-tazobactam Stopped (03/03/23 0534)   prismasol BGK 4/2.5 400 mL/hr at 03/03/23 0453   prismasol BGK 4/2.5 400 mL/hr at 03/03/23 0452   prismasol BGK 4/2.5 1,400 mL/hr at 03/03/23 0733   vancomycin Stopped (03/02/23 1709)   vasopressin 0.03 Units/min (03/03/23 0800)   acetaminophen (TYLENOL) oral liquid 160 mg/5 mL, fentaNYL (SUBLIMAZE) injection, fentaNYL (SUBLIMAZE) injection, heparin, heparin sodium (porcine), ipratropium-albuterol, midazolam, mouth rinse, prochlorperazine

## 2023-03-03 NOTE — Progress Notes (Signed)
 NAME:  Nathaniel White, MRN:  [id number], DOB:  May 31, 1960, LOS: 5 ADMISSION DATE:  02/25/2023 CONSULTATION DATE:  02/28/2023 REFERRING MD:  Jerral Ralph - TRH,  CHIEF COMPLAINT:  Cardiac arrest   History of Present Illness:  63 year old man who presented to [facility] 2/17 for SOB after truncated HD session (last 2/15). PMHx significant for HTN, HLD, CVA (with L-sided deficits), PVD, PAF (on Eliquis), HFrEF with NICM, severe MR, COPD (on 2-3L HOT), T2DM, ESRD on HD TTS. Recent admission 1/27-1/29 for volume overload/pulmonary edema.   Patient was reportedly slated for discharge 2/19 but remained in-house due to weather conditions and concern he would not be able to to have transport to HD 2/20; HD was completed overnight 2/19-2/20. While in HD 2/20AM, Code Blue was called for VF arrest. CPR x 4 minutes with Epi x 1, amio 150mg  and 300mg , lidocaine x 1, Mg/Ca repletion. ROSC was achieved and patient was awake and alert, repeatedly saying "help me" and "I can't breathe". NRB was attempted with ongoing extremis. On two additional occasions, patient rhythm deteriorated into monomorphic VT and he was shocked x 2. Decision was made to intubate.  PCCM consulted for ICU transfer and management.  Pertinent Medical History:   Past Medical History:  Diagnosis Date   Anemia    Anxiety    Asthma    Complication from renal dialysis device 11/02/2013   Complication of anesthesia 2017   according to pt and spouse pt was moving around and cough while under and pt had difficulty waking up so the anesthesia had to be reversed. and pt admitted to ICU.    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/2008   Qualifier: Diagnosis of  By: Russella Dar MD Marylu Lund    ESRD    on HD, T-TH-Sat - [place]    Hemiparesis due to old stroke [facility])    left   HFrEF (heart failure with reduced ejection fraction) (HCC)    NICM // TTE 06/2022: EF 25-30, sever RV dysfunction, mod to severe MR   Hypertension    Hx, not current problems, no meds   LV dysfunction    EF 25-30% by echo 07/2011   Memory loss due to medical condition    due to stroke   MR (mitral regurgitation)    moderate to severe, echo 07/2011   Nausea with vomiting, unspecified 06/15/2021   Paroxysmal atrial fibrillation (HCC)    Peripheral vascular disease (HCC)    Shortness of breath    with exertion   Stroke [facility])    TIA's-left sided weakness   Tobacco abuse     Significant Hospital Events: Including procedures, antibiotic start and stop dates in addition to other pertinent events   2/20 - Cardiac arrest in HD, initial rhythm VF with CPR x 2 rounds, Epi/Mg/Ca, shocked x 2; after ROSC, monomorphic VT requiring shock x 2. Intubated at bedside in dialysis. Transferred to [facility]. Cardiology consulted. NE weaned off during day   Interim History / Subjective:  Able to pull some fluid with CRRT, NE slightly decreased. He denies discomfort.   Objective:  Blood pressure (!) 132/37, pulse (!) 191, temperature (!) 97.4 F (36.3 C), temperature source Axillary, resp. rate 18, height 5\' 7"  (1.702 m), weight 70.2 kg,  SpO2 (!) 84%. CVP:  [13 mmHg-15 mmHg] 15 mmHg  Vent Mode: PRVC FiO2 (%):  [40 %] 40 % Set Rate:  [18 bmp] 18 bmp Vt Set:  [530 mL] 530 mL PEEP:  [5 cmH20] 5 cmH20 Plateau Pressure:  [31 cmH20-37 cmH20] 32 cmH20   Intake/Output Summary (Last 24 hours) at 03/03/2023 1801 Last data filed at 03/03/2023 1710 Gross per 24 hour  Intake 1847.45 ml  Output 2097 ml  Net -249.55 ml   Filed Weights   02/26/23 1314 02/28/23 0630  Weight: 68 kg 70.2 kg   Physical Examination: General critically ill appearing man lying in bed in NAD HEENT  [place]/AT, eyes anicteric. +Cataracts. ETT in place. Pulmonary faint rhales in bases, CTA  anteriorly. Cardiac S1S2, RRR Abdomen: soft, NT Extremities minimal muscle mass, no significant edema Neuro   RASS -1, nodding to answer questions, globally weak.   Na+ 135 BUN 20, Cr 2.23 on CRRT BNP >4500 PCT 3.57 WBC 10 H/H 11.2/38.7 Platelets 207 Trach aspirate culture: few GPC   Resolved Hospital Problem List:  Cardiogenic shock  VT/VF in-hospital cardiac arrest 2/20 Post cardiac arrest lactic acidosis  Assessment & Plan:    End-stage NICM (EF 30-35%) w/ chronic hypotension; intolerant to GDMT due to hypotension and renal failure Paroxysmal Afib and prolonged Qtc VT s/p in-hospital cardiac arrest 2/20 (TIME TO rosc 4 MIN) -due to concern for lidocaine toxicity, switched to mexilitine -due to Qtc prolongation, no amiodarone -avoid Qtc prolonging meds -monitor electrolytes; CRRT -con't PTA midodrine -eliquis BID  Presumed septic shock due to aspiration pneumonia -con't vanc & zosyn -stress dose steroids -NE & vasopressin to maintain MAP>65  Acute hypoxemic respiratory failure following VF arrest w/ bilateral airspace disease most likely reflecting pulmonary edema  Likely aspiration pneumonia from cardiac arrest -LTVV -VAP prevention protocol -daily SAT & SBT; failed again today due to hypoventilation. Worry about NM weakness.  -pulling volume with CRRT -Empiric HAP antibiotics- vanc, zosyn, follow culture and deescalate as able.   ESRD on HD TTS; PCKD Hypervolemic hyponatremia -CRRT via TDC -strict I/O -renally dose meds, avoid nephrotoxic meds   T2DM w/ hyperglycemia -SSI PRN -goal BG 140-180  Moderate protein energy malnutrition; previously held for vomiting -Tf, advancing slowly -with Qtc need to be careful with antiemetics -avoid constipation  Constipation, improved -con't bowel regimen  Anemia -transfuse for Hb< 7 or hemodynamically significant bleeding; no current indications  One family member at bedside during rounds and updated today.    Best Practice: (right click and "Reselect all SmartList Selections" daily)   Diet/type: tubefeeds DVT prophylaxis: DOAC GI prophylaxis: PPI Lines: Central line, Dialysis Catheter, and Arterial Line Foley:  N/A Code Status:  full code Last date of multidisciplinary goals of care discussion [2/22 with palliative care]  Critical care time: 36 min.    Steffanie Dunn, DO 03/03/23 7:11 PM [facility]  For contact information, see Amion. If no response to pager, please call PCCM consult pager. After hours, 7PM- 7AM, please call Elink.

## 2023-03-03 NOTE — Progress Notes (Signed)
 Daily Progress Note   Patient Name: Nathaniel White       Date: 03/03/2023 DOB: 13-Sep-1960  Age: 63 y.o. MRN#: [id number] Attending Physician: Steffanie Dunn, DO Primary Care Physician: Claiborne Rigg, NP Admit Date: 02/25/2023  Reason for Consultation/Follow-up: Establishing goals of care  Subjective: I have reviewed medical records including EPIC notes, MAR, any available advanced directives as necessary, and labs. Still requiring high doses of levo and vaso - also on midodrine. Received report from primary RN - no acute concerns. Per RN, able to pull fluid today.  Went to visit patient at bedside - no family/visitors present. Patient was lying in bed - he remains intubated. No signs or non-verbal gestures of pain or discomfort noted. No respiratory distress, increased work of breathing, or secretions noted. He is ill and frail appearing.  9:40 AM Conference called son/Nathaniel White, mother/Nathaniel White, and sister/Nathaniel White per their request yesterday. Provided updates from clinical assessment, RN assessment, Nephrology notes, MAR, VS, and labs. Family remain hopeful at this time but understand that patient remains in multiorgan system failure and prognosis is guarded to poor. Further education provided on CRRT - they understand it remains uncertain if patient Nathaniel White be able to tolerate iHD. They are still open to follow up meeting tomorrow for ongoing GOC pending patient's clinical course.   Family have no follow up questions after GOC yesterday.   Family express appreciation for updates today.  All questions and concerns addressed. Encouraged to call with questions and/or concerns. PMT card previously provided.  Length of Stay: 5  Current Medications: Scheduled Meds:   apixaban  5 mg Per Tube BID    Chlorhexidine Gluconate Cloth  6 each Topical Daily   docusate  100 mg Per Tube BID   famotidine  20 mg Per Tube Daily   feeding supplement (PROSource TF20)  60 mL Per Tube Daily   hydrocortisone sod succinate (SOLU-CORTEF) inj  100 mg Intravenous Q12H   insulin aspart  0-6 Units Subcutaneous Q4H   mexiletine  200 mg Per Tube Q8H   midodrine  5 mg Per Tube 3 times per day on Sunday Monday Wednesday Friday   mouth rinse  15 mL Mouth Rinse Q2H   polyethylene glycol  17 g Per Tube Daily    Continuous Infusions:  feeding supplement (OSMOLITE 1.5 CAL) 20  mL/hr at 03/03/23 0900   heparin sodium (porcine)     norepinephrine (LEVOPHED) Adult infusion 26.987 mcg/min (03/03/23 0900)   piperacillin-tazobactam Stopped (03/03/23 0534)   prismasol BGK 4/2.5 400 mL/hr at 03/03/23 0453   prismasol BGK 4/2.5 400 mL/hr at 03/03/23 0452   prismasol BGK 4/2.5 1,400 mL/hr at 03/03/23 0733   vancomycin Stopped (03/02/23 1709)   vasopressin 0.03 Units/min (03/03/23 0900)    PRN Meds: acetaminophen (TYLENOL) oral liquid 160 mg/5 mL, fentaNYL (SUBLIMAZE) injection, fentaNYL (SUBLIMAZE) injection, heparin, heparin sodium (porcine), ipratropium-albuterol, midazolam, mouth rinse, prochlorperazine  Physical Exam Vitals and nursing note reviewed.  Constitutional:      General: He is not in acute distress.    Appearance: He is ill-appearing.     Interventions: He is intubated.  Pulmonary:     Effort: No respiratory distress. He is intubated.  Skin:    General: Skin is warm and dry.  Neurological:     Motor: Weakness present.             Vital Signs: BP (!) 121/16   Pulse 90   Temp (!) 97.4 F (36.3 C) (Axillary)   Resp 18   Ht 5\' 7"  (1.702 m)   Wt 70.2 kg   SpO2 93%   BMI 24.24 kg/m  SpO2: SpO2: 93 % O2 Device: O2 Device: Ventilator O2 Flow Rate: O2 Flow Rate (L/min): 3 L/min  Intake/output summary:  Intake/Output Summary (Last 24 hours) at 03/03/2023 1914 Last data filed at 03/03/2023  0900 Gross per 24 hour  Intake 1902.66 ml  Output 1865 ml  Net 37.66 ml   LBM: Last BM Date : 03/02/23 Baseline Weight: Weight:  (In ER stretcher that is not zero'd out) Most recent weight: Weight: 70.2 kg       Palliative Assessment/Data: PPS 30% with tube feeds      Patient Active Problem List   Diagnosis Date Noted   On mechanically assisted ventilation (HCC) 03/01/2023   Aspiration pneumonia of both lower lobes due to gastric secretions (HCC) 03/01/2023   Shock (HCC) 03/01/2023   Cardiac arrest (HCC) 02/28/2023   Fluid overload 02/26/2023   Dyspnea 02/05/2023   Acute exacerbation of CHF (congestive heart failure) (HCC) 01/15/2023   CAP (community acquired pneumonia) 01/15/2023   Hyperkalemia 01/15/2023   Chronic ITP (idiopathic thrombocytopenia) (HCC) 01/15/2023   Acute hypoxic respiratory failure (HCC) 01/15/2023   Respiratory failure (HCC) 01/15/2023   Hypokalemia 12/28/2022   Prolonged QT interval 12/28/2022   Chronic hypoxic respiratory failure (HCC) 12/28/2022   Chronic systolic CHF (congestive heart failure) (HCC) 12/28/2022   Elevated troponin 12/28/2022   Acute bronchitis due to Rhinovirus 12/28/2022   First degree heart block 09/17/2022   Puncture wound of left knee with complication 09/15/2022   Open wound of penis with complication 09/15/2022   Sepsis secondary to penile infection 09/15/2022   Diabetes mellitus without complication (HCC) 09/15/2022   Encounter for screening for respiratory tuberculosis 08/20/2022   Mitral regurgitation 07/03/2022   Ventricular tachycardia, unspecified (HCC) 06/20/2022   Palpitations 06/19/2022   Volume overload 09/14/2021   COPD with acute exacerbation (HCC)    Pulmonary embolus, right (HCC) 09/07/2021   Severe pulmonary hypertension (HCC) 09/07/2021   End-stage renal disease on hemodialysis (HCC) 09/07/2021   Diabetes mellitus type 2, controlled, without complications (HCC) 09/07/2021   Anemia of chronic disease  09/07/2021   SOB (shortness of breath) 09/04/2021   History of pulmonary embolism 09/04/2021   Pulmonary embolism (HCC) 09/04/2021  NSVT (nonsustained ventricular tachycardia) (HCC) 06/26/2021   Hypoglycemia 06/24/2021   Cirrhosis (HCC) 06/23/2021   Confusion 06/22/2021   Necrotizing pneumonia (HCC) 06/21/2021   CVA (cerebral vascular accident) (HCC) 06/21/2021   Paroxysmal atrial fibrillation (HCC) 06/21/2021   Chronic hypotension 06/21/2021   Unusual Appearance with Innumerable Lesions on CT scan, needs MRI 06/21/2021   Aortic atherosclerosis (HCC) 06/21/2021   ESRD (end stage renal disease) (HCC) 06/20/2021   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 (HCC) 06/20/2021   Nausea with vomiting, unspecified 06/15/2021   Acute on chronic hypoxic respiratory failure (HCC) 03/10/2021   Shock circulatory (HCC) 03/10/2021   Hemodialysis-associated hypotension 03/10/2021   Finger infection    Paronychia of finger, left 02/03/2021   Left hand pain 12/29/2020   Allergy, unspecified, initial encounter 09/28/2019   Anaphylactic shock, unspecified, initial encounter 09/28/2019   Pressure ulcer of ankle 09/01/2018   Thrombocytopenia (HCC) 01/17/2015   Transfusion history 01/17/2015   Hypercalcemia 11/01/2014   Dependence on renal dialysis (HCC) 03/26/2014   Encounter for fitting and adjustment of extracorporeal dialysis catheter (HCC) 03/26/2014   Diarrhea, unspecified 01/03/2014   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/10/2013   Pain, unspecified 09/23/2013   Cough 09/07/2013   HFrEF (heart failure with reduced ejection fraction) (HCC) 08/14/2013   Coagulation defect, unspecified (HCC) 12/15/2012   Moderate protein-calorie malnutrition (HCC) 08/07/2012   Restrictive lung disease 07/06/2012   History of CVA (cerebrovascular accident) 08/13/2011   ESRD on dialysis (HCC) 03/13/2011   HTN (hypertension) 03/13/2011   H/O 03/13/2011   Anemia 03/13/2011   Fluid  overload, unspecified 03/13/2011   History of nonadherence to medical treatment 03/13/2011   Non-insulin dependent type 2 diabetes mellitus (HCC) 03/13/2011   Tobacco abuse 03/13/2011   Kidney transplant status 11/28/2008   HEMATOCHEZIA 10/04/2008   Iron deficiency anemia, unspecified 03/31/2003   Patient's noncompliance with other medical treatment and regimen 03/31/2003   Personal history of nicotine dependence 03/31/2003   Secondary hyperparathyroidism of renal origin (HCC) 03/31/2003    Palliative Care Assessment & Plan   Patient Profile: 63 y.o. male with past medical history of ESRD on HD TTS, COPD, chronic hypoxemic respiratory failure on 2.5 L home oxygen, chronic HFrEF (EF 35-40% 12/2022), severe mitral regurgitation, paroxysmal A-fib on Eliquis, chronic hypotension on midodrine, chronic anemia, PVD, CVA/TIA was admitted on 02/25/2023 with acute on chronic hypoxemic respiratory failure secondary to volume overload/pulmonary edema in setting of ESRD on HD, acute on chronic HFrEF and known severe mitral regurgitation, hypocalcemia, metabolic acidosis, elevated troponin, QT prolongation, end stage NICM (EF 30-35%) with chronic hypotension. His hospital course was complicated by code blue event on 2/21 - during HD patient went into VF arrest while receiving HD. CPR x4 minutes with ACLS medications administered. ROSC was achieved; however, on two additional occasions, patient rhythm deteriorated into monomorphic VT and he was shocked x 2. Decision was made to intubate.   Assessment: Principal Problem:   Fluid overload Active Problems:   ESRD on dialysis (HCC)   Acute on chronic hypoxic respiratory failure (HCC)   Paroxysmal atrial fibrillation (HCC)   Volume overload   Acute exacerbation of CHF (congestive heart failure) (HCC)   Cardiac arrest (HCC)   On mechanically assisted ventilation (HCC)   Aspiration pneumonia of both lower lobes due to gastric secretions (HCC)   Shock (HCC)    Concern about end of life  Recommendations/Plan: Continue full scope care with watchful waiting Continue DNR as previously documented Family Nathaniel White make stepwise decisions  pending patient's clinical course Follow up family meeting scheduled for Monday 2/24 at 12p for ongoing GOC PMT Nathaniel White continue to follow and support holistically  Goals of Care and Additional Recommendations: Limitations on Scope of Treatment: Full Scope Treatment  Code Status:    Code Status Orders  (From admission, onward)           Start     Ordered   03/02/23 1428  Do not attempt resuscitation (DNR) Pre-Arrest Interventions Desired  (Code Status)  Continuous       Question Answer Comment  If pulseless and not breathing No CPR or chest compressions.   In Pre-Arrest Conditions (Patient Has Pulse and Is Breathing) May intubate, use advanced airway interventions and cardioversion/ACLS medications if appropriate or indicated. May transfer to ICU.   Consent: Discussion documented in EHR or advanced directives reviewed      03/02/23 1428           Code Status History     Date Active Date Inactive Code Status Order ID Comments User Context   02/26/2023 0636 03/02/2023 1428 Full Code [id number]  John Giovanni, MD ED   02/05/2023 0254 02/06/2023 1844 Full Code [id number]  Darlin Drop, DO ED   01/15/2023 0909 01/19/2023 2138 Full Code [id number]  Minor, Vilinda Blanks, NP ED   01/15/2023 0450 01/15/2023 0909 Full Code [id number]  Tereasa Coop, MD ED   12/27/2022 2358 01/01/2023 1946 Full Code [id number]  Howerter, Chaney Born, DO ED   11/29/2022 1031 11/30/2022 2212 Full Code [id number]  Lurline Del, MD ED   09/15/2022 1243 09/24/2022 2333 Full Code [id number]  Alfredo Martinez, MD ED   06/22/2022 1739 06/28/2022 2159 DNR [id number]  Ernie Avena, NP Inpatient   06/20/2022 0033 06/22/2022 1739 Full Code [id number]  Joellen Jersey., MD ED   09/13/2021 2350 09/17/2021 2224 Full Code [id number]  Howerter, Chaney Born, DO ED    09/04/2021 2104 09/08/2021 1824 Full Code [id number]  Eduard Clos, MD Inpatient   06/20/2021 0942 06/26/2021 2359 Full Code [id number]  Graceann Congress, PA-C Inpatient   07/21/2018 1138 07/21/2018 1714 Full Code [id number]  Maeola Harman, MD Inpatient   11/10/2013 1812 11/11/2013 2126 Full Code [id number]  Raymond Gurney, PA-C Inpatient   10/09/2013 1343 10/09/2013 2026 Full Code [id number]  Kathleene Hazel, MD Inpatient   08/10/2011 1702 08/19/2011 1428 Full Code [id number]  Alonna Buckler, RN Inpatient   03/13/2011 0647 03/14/2011 1849 Full Code [id number]  Eduard Clos, MD Inpatient      Advance Directive Documentation    Flowsheet Row Most Recent Value  Type of Advance Directive Living Nathaniel White, Healthcare Power of Attorney  Pre-existing out of facility DNR order (yellow form or pink MOST form) --  "MOST" Form in Place? --       Prognosis:  Guarded to poor  Discharge Planning: To Be Determined  Care plan was discussed with primary RN, patient's family  Thank you for allowing the Palliative Medicine Team to assist in the care of this patient.   Total Time 50 minutes Prolonged Time Billed  no       Haskel Khan, NP  Please contact Palliative Medicine Team phone at [phone] for questions and concerns.   *Portions of this note are a verbal dictation therefore any spelling and/or grammatical errors are due to the "[employer]" system interpretation.

## 2023-03-04 ENCOUNTER — Telehealth: Payer: Self-pay

## 2023-03-04 ENCOUNTER — Other Ambulatory Visit: Payer: Self-pay

## 2023-03-04 DIAGNOSIS — Z992 Dependence on renal dialysis: Secondary | ICD-10-CM | POA: Diagnosis not present

## 2023-03-04 DIAGNOSIS — I469 Cardiac arrest, cause unspecified: Secondary | ICD-10-CM | POA: Diagnosis not present

## 2023-03-04 DIAGNOSIS — Z515 Encounter for palliative care: Secondary | ICD-10-CM | POA: Diagnosis not present

## 2023-03-04 DIAGNOSIS — J690 Pneumonitis due to inhalation of food and vomit: Secondary | ICD-10-CM

## 2023-03-04 DIAGNOSIS — E8770 Fluid overload, unspecified: Secondary | ICD-10-CM | POA: Diagnosis not present

## 2023-03-04 DIAGNOSIS — J9601 Acute respiratory failure with hypoxia: Secondary | ICD-10-CM | POA: Diagnosis not present

## 2023-03-04 DIAGNOSIS — Z7189 Other specified counseling: Secondary | ICD-10-CM | POA: Diagnosis not present

## 2023-03-04 DIAGNOSIS — N186 End stage renal disease: Secondary | ICD-10-CM | POA: Diagnosis not present

## 2023-03-04 DIAGNOSIS — A419 Sepsis, unspecified organism: Secondary | ICD-10-CM

## 2023-03-04 LAB — GLUCOSE, CAPILLARY
Glucose-Capillary: 128 mg/dL — ABNORMAL HIGH (ref 70–99)
Glucose-Capillary: 96 mg/dL (ref 70–99)
Glucose-Capillary: 113 mg/dL — ABNORMAL HIGH (ref 70–99)
Glucose-Capillary: 120 mg/dL — ABNORMAL HIGH (ref 70–99)
Glucose-Capillary: 134 mg/dL — ABNORMAL HIGH (ref 70–99)
Glucose-Capillary: 107 mg/dL — ABNORMAL HIGH (ref 70–99)

## 2023-03-04 LAB — CULTURE, RESPIRATORY W GRAM STAIN: Culture: NORMAL

## 2023-03-04 LAB — RENAL FUNCTION PANEL
Albumin: 2 g/dL — ABNORMAL LOW (ref 3.5–5.0)
Albumin: 2 g/dL — ABNORMAL LOW (ref 3.5–5.0)
Anion gap: 12 (ref 5–15)
Anion gap: 8 (ref 5–15)
BUN: 20 mg/dL (ref 6–20)
BUN: 21 mg/dL — ABNORMAL HIGH (ref 6–20)
CO2: 24 mmol/L (ref 22–32)
CO2: 23 mmol/L (ref 22–32)
Calcium: 8.7 mg/dL — ABNORMAL LOW (ref 8.9–10.3)
Calcium: 8.6 mg/dL — ABNORMAL LOW (ref 8.9–10.3)
Chloride: 98 mmol/L (ref 98–111)
Chloride: 103 mmol/L (ref 98–111)
Creatinine, Ser: 1.96 mg/dL — ABNORMAL HIGH (ref 0.61–1.24)
Creatinine, Ser: 1.87 mg/dL — ABNORMAL HIGH (ref 0.61–1.24)
GFR, Estimated: 38 mL/min — ABNORMAL LOW (ref >60)
GFR, Estimated: 41 mL/min — ABNORMAL LOW (ref >60)
Glucose, Bld: 149 mg/dL — ABNORMAL HIGH (ref 70–99)
Glucose, Bld: 144 mg/dL — ABNORMAL HIGH (ref 70–99)
Phosphorus: 2.6 mg/dL (ref 2.5–4.6)
Phosphorus: 2.5 mg/dL (ref 2.5–4.6)
Potassium: 4.5 mmol/L (ref 3.5–5.1)
Potassium: 4.5 mmol/L (ref 3.5–5.1)
Sodium: 134 mmol/L — ABNORMAL LOW (ref 135–145)
Sodium: 134 mmol/L — ABNORMAL LOW (ref 135–145)

## 2023-03-04 LAB — CBC
HCT: 37.6 % — ABNORMAL LOW (ref 39.0–52.0)
Hemoglobin: 10.8 g/dL — ABNORMAL LOW (ref 13.0–17.0)
MCH: 24.8 pg — ABNORMAL LOW (ref 26.0–34.0)
MCHC: 28.7 g/dL — ABNORMAL LOW (ref 30.0–36.0)
MCV: 86.4 fL (ref 80.0–100.0)
Platelets: 156 10*3/uL (ref 150–400)
RBC: 4.35 MIL/uL (ref 4.22–5.81)
RDW: 18 % — ABNORMAL HIGH (ref 11.5–15.5)
WBC: 9.3 10*3/uL (ref 4.0–10.5)
nRBC: 0 % (ref 0.0–0.2)

## 2023-03-04 LAB — MAGNESIUM: Magnesium: 2.5 mg/dL — ABNORMAL HIGH (ref 1.7–2.4)

## 2023-03-04 LAB — PROCALCITONIN: Procalcitonin: 1.84 ng/mL

## 2023-03-04 LAB — BRAIN NATRIURETIC PEPTIDE: B Natriuretic Peptide: >4500 pg/mL — ABNORMAL HIGH (ref 0.0–100.0)

## 2023-03-04 MED ORDER — OSMOLITE 1.5 CAL PO LIQD: 1000.0000 mL | ORAL | Status: DC

## 2023-03-04 MED ORDER — OSMOLITE 1.5 CAL PO LIQD
1000.0000 mL | ORAL | Status: DC
  Administered 2023-03-05 – 2023-03-08 (×3): 1000 mL

## 2023-03-04 MED ORDER — BANATROL TF EN LIQD
60.0000 mL | Freq: Two times a day (BID) | ENTERAL | Status: DC
  Administered 2023-03-04 – 2023-03-09 (×11): 60 mL
  Filled 2023-03-04 (×11): qty 60

## 2023-03-04 MED ORDER — PROSOURCE TF20 ENFIT COMPATIBL EN LIQD
60.0000 mL | Freq: Three times a day (TID) | ENTERAL | Status: DC
  Administered 2023-03-04 – 2023-03-09 (×14): 60 mL
  Filled 2023-03-04 (×14): qty 60

## 2023-03-04 MED ORDER — HYDROMORPHONE HCL 1 MG/ML IJ SOLN
0.5000 mg | INTRAMUSCULAR | Status: DC | PRN
  Administered 2023-03-04: 1 mg via INTRAVENOUS
  Filled 2023-03-04: qty 1

## 2023-03-04 NOTE — Progress Notes (Signed)
 NAME:  Nathaniel White, MRN:  [id number], DOB:  [birth year], LOS: 6 ADMISSION DATE:  02/25/2023 CONSULTATION DATE:  02/28/2023 REFERRING MD:  Jerral Ralph - TRH,  CHIEF COMPLAINT:  Cardiac arrest   History of Present Illness:  63 year old man who presented to [facility] 2/17 for SOB after truncated HD session (last 2/15). PMHx significant for HTN, HLD, CVA (with L-sided deficits), PVD, PAF (on Eliquis), HFrEF with NICM, severe MR, COPD (on 2-3L HOT), T2DM, ESRD on HD TTS. Recent admission 1/27-1/29 for volume overload/pulmonary edema.   Patient was reportedly slated for discharge 2/19 but remained in-house due to weather conditions and concern he would not be able to to have transport to HD 2/20; HD was completed overnight 2/19-2/20. While in HD 2/20AM, Code Blue was called for VF arrest. CPR x 4 minutes with Epi x 1, amio 150mg  and 300mg , lidocaine x 1, Mg/Ca repletion. ROSC was achieved and patient was awake and alert, repeatedly saying "help me" and "I can't breathe". NRB was attempted with ongoing extremis. On two additional occasions, patient rhythm deteriorated into monomorphic VT and he was shocked x 2. Decision was made to intubate.  PCCM consulted for ICU transfer and management.  Pertinent Medical History:   Past Medical History:  Diagnosis Date   Anemia    Anxiety    Asthma    Complication from renal dialysis device 11/02/2013   Complication of anesthesia 2017   according to pt and spouse pt was moving around and cough while under and pt had difficulty waking up so the anesthesia had to be reversed. and pt admitted to ICU.    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/2008   Qualifier: Diagnosis of  By: Russella Dar MD Marylu Lund    ESRD    on HD, T-TH-Sat - [place]    Hemiparesis due to old stroke [facility])    left   HFrEF (heart failure with reduced ejection fraction) (HCC)    NICM // TTE 06/2022: EF 25-30, sever RV dysfunction, mod to severe MR   Hypertension    Hx, not current problems, no meds   LV dysfunction    EF 25-30% by echo 07/2011   Memory loss due to medical condition    due to stroke   MR (mitral regurgitation)    moderate to severe, echo 07/2011   Nausea with vomiting, unspecified 06/15/2021   Paroxysmal atrial fibrillation (HCC)    Peripheral vascular disease (HCC)    Shortness of breath    with exertion   Stroke [facility])    TIA's-left sided weakness   Tobacco abuse     Significant Hospital Events: Including procedures, antibiotic start and stop dates in addition to other pertinent events   2/20 - Cardiac arrest in HD, initial rhythm VF with CPR x 2 rounds, Epi/Mg/Ca, shocked x 2; after ROSC, monomorphic VT requiring shock x 2. Intubated at bedside in dialysis. Transferred to [facility] CVICU. Cardiology consulted. NE weaned off during day   Interim History / Subjective:  No acute events overnight Remains on vent, pressors NE 10 and vaso CRRT ongoing pulling 57mL/hr  Did not wean well SBT this morning. Fluctuated between low Vt and rapid RR. Intermittently desaturating.    Objective:  Blood pressure (!) 118/21, pulse 84,  temperature 97.7 F (36.5 C), temperature source Axillary, resp. rate 18, height 5\' 7"  (1.702 m), weight 70 kg, SpO2 100%. CVP:  [4 mmHg-15 mmHg] 11 mmHg  Vent Mode: PRVC FiO2 (%):  [40 %] 40 % Set Rate:  [18 bmp] 18 bmp Vt Set:  [530 mL] 530 mL PEEP:  [5 cmH20] 5 cmH20 Plateau Pressure:  [31 cmH20-32 cmH20] 31 cmH20   Intake/Output Summary (Last 24 hours) at 03/04/2023 1026 Last data filed at 03/04/2023 1000 Gross per 24 hour  Intake 1489.43 ml  Output 2463 ml  Net -973.57 ml   Filed Weights   02/26/23 1314 02/28/23 0630 03/04/23 0500  Weight: 68 kg 70.2 kg 70 kg   Physical Examination:  General Critically ill  appearing thin adult male in NAD on vent HEENT  [place]/AT, cataracts. JVD noted.  Pulmonary: CTA bilaterally.  Cardiac RRR, no MRG Abdomen: Soft, NT, ND Extremities: no acute deformity.  Neuro   RASS -1, opens eyes to nod for questions. Globally weak.    Na+ 134 BUN 20, Cr 1.96 on CRRT BNP >4500 PCT 1.84 ? WBC 9.3 Hbg 10.8 Platelets 156 Trach aspirate culture: few GPC   Resolved Hospital Problem List:  Cardiogenic shock  VT/VF in-hospital cardiac arrest 2/20 Post cardiac arrest lactic acidosis  Assessment & Plan:    End-stage NICM (EF 30-35%) w/ chronic hypotension; intolerant to GDMT due to hypotension and renal failure Paroxysmal Afib and prolonged Qtc VT s/p in-hospital cardiac arrest 2/20 (TIME TO rosc 4 MIN) -due to concern for lidocaine toxicity, switched to mexilitine -due to Qtc prolongation, no amiodarone -avoid Qtc prolonging meds -Continue home midodrine -eliquis BID  Presumed septic shock due to aspiration pneumonia - continue zosyn and vancomycin - Few GPC on sputum culture. Continue to follow - NE and Vasopressin for MAP 65  Acute hypoxemic respiratory failure following VF arrest w/ bilateral airspace disease most likely reflecting pulmonary edema  Likely aspiration pneumonia from cardiac arrest -Full vent support LTVV -VAP prevention protocol -daily SAT & SBT; failed again today. Low volume and tachypnea.  -pulling volume with CRRT -Empiric HAP antibiotics- vanc, zosyn, follow culture and deescalate as able.   ESRD on HD TTS; PCKD Hypervolemic hyponatremia -CRRT via TDC -strict I/O -renally dose meds, avoid nephrotoxic meds   T2DM w/ hyperglycemia -SSI PRN -goal BG 140-180  Moderate protein calorie malnutrition -Tf, advancing slowly d/t vomiting -with Qtc need to be careful with antiemetics -avoid constipation  Constipation, improved -bowel regimen  Anemia -transfuse for Hb< 7 or hemodynamically significant bleeding; no current  indications  Planning for family meeting at noon today.   Best Practice: (right click and "Reselect all SmartList Selections" daily)   Diet/type: tubefeeds DVT prophylaxis: DOAC GI prophylaxis: PPI Lines: Central line, Dialysis Catheter, and Arterial Line Foley:  N/A Code Status:  full code Last date of multidisciplinary goals of care discussion [2/22 with palliative care]  Critical care time: 37 minutes.     Joneen Roach, AGACNP-BC [facility]  See Amion for personal pager PCCM on call pager [phone] until 7pm. Please call Elink 7p-7a. [phone]  03/04/2023 10:32 AM

## 2023-03-04 NOTE — Patient Outreach (Signed)
 Care Management  Transitions of Care Program Managed Medicaid Transitions of Care week 4  03/04/2023 Name: [name] MRN: [id number] DOB: 1960/07/17  Subjective: Nathaniel White is a 63 y.o. year old male who is a primary care patient of Claiborne Rigg, NP. The Care Management team was unable to reach the patient by phone to assess and address transitions of care needs due to patient's readmission to [facility] 02/25/23 PM to present time.   Plan: Will attempt to re-engage and re-enroll patient in Transition of Care Program after patient is discharged from hospital.  Elnita Maxwell A. Mliss Fritz RN, BA, [facility], CRRN [facility] RN Care Manager, Transition of Care [phone]

## 2023-03-04 NOTE — Progress Notes (Addendum)
 Nutrition Follow-up  DOCUMENTATION CODES:   Not applicable  INTERVENTION:  Initiate tube feeding via OGT: Osmolite 1.5 at 50 ml/h (1200 ml per day) Prosource TF20 60 ml TID Continue at 56mL/hr and increase by 81mL/hr Q12 hours until goal rate achieved   Provides 2040 kcal, 135 gm protein, 915 ml free water daily   Add Banatrol BID, as bulking agent. Each packet contains 5g of soluble fiber   NUTRITION DIAGNOSIS:  Inadequate oral intake related to inability to eat as evidenced by NPO status. - remains applicable  GOAL:  Patient will meet greater than or equal to 90% of their needs - to be met with tube feeds  MONITOR:  Diet advancement, TF tolerance, Labs  REASON FOR ASSESSMENT:  Consult Assessment of nutrition requirement/status  ASSESSMENT:   Patient admitted to [facility] on 03/18/2023 with c/o SOB after shortened HD tx. Was slated to discharge 2023-03-20, but concerned about transport 2/2 weather conditions. Received another HD tx evening of 03-20-23 and coded half way into tx. Transferred to 2H, thereafter. PMH: HTN, HLD, CVA (L sided deficits), PVD, PAF, HFrEF, severe MR, COPD, T2DM, ESRD-HD.  2023-03-18 admitted for SOB 2023/03/20 coded on HD tx, intubated, OGT placed 2/20 TF initiated/advanced 2/21 emesis, TF held 2/22 trickles re-initiated overnight 2/24 TF advanced  Patient did not wean well with SBT this morning. He is able to open eyes and nod for questions.  Concern for not being able to tolerate iHD. When awake, he is trying to pull tube out. Mittens in place. Family meeting scheduled for today for continued GOC discussion.   Patient is currently intubated on ventilator support MV: 10.1 L/min Temp (24hrs), Avg:98.2 F (36.8 C), Min:97.4 F (36.3 C), Max:98.8 F (37.1 C)  Patient tolerated trickle feeds over the weekend. NP advanced to 60mL/hr today. Discussed further advancement with attending via secure chat. Amicable to increasing slowly. He was with constipation. Bowel regimen was  initiated. RN reporting he is now with loose stools. Will add Banatrol to bulk. No further emesis.   Admit Weight: 68kg Current Weight: 70.0kg EDW: 67.5kg  Remains on CRRT and had 2.3L UF over 2/23. Goal continues to be 25-56mL/hr net negative with CRRT.   Intake/Output Summary (Last 24 hours) at 03/04/2023 1409 Last data filed at 03/04/2023 1400 Gross per 24 hour  Intake 1535.83 ml  Output 2306 ml  Net -770.17 ml     Net IO Since Admission: -780.3 mL [03/04/23 1409]   Drains/Lines: OGT (proximal fundal - gastric) TDC R femoral cath  Labs stabilizing w/ CRRT. Low dose pressor support required.   Meds: docusate, SSI  pantoprazole, Miralax, IV ABX Drips: Levophed @ 31mcg/min Vasopressin @ 0.03 units/min   Labs:  Na+ 132>135>134(L) K+ 4.8>4.7>4.5 (wdl) Mg 2.5>2.6>2.5 (H) CBGs 149-169 x48 hours A1c 4.8 (12/2022)  Diet Order:   Diet Order             Diet NPO time specified  Diet effective now           Diet - low sodium heart healthy            EDUCATION NEEDS:   Not appropriate for education at this time  Skin:  Skin Assessment: Reviewed RN Assessment  Last BM:  2/24 - type 7 (FMS)  Height:  Ht Readings from Last 1 Encounters:  03/04/23 5\' 7"  (1.702 m)   Weight:  Wt Readings from Last 1 Encounters:  03/04/23 70 kg   Ideal Body Weight:  67.3 kg  BMI:  Body mass index is 24.17 kg/m.  Estimated Nutritional Needs:   Kcal:  2000-2200kcal  Protein:  130-145g  Fluid:  1L + UOP  Myrtie Cruise MS, RD, LDN Registered Dietitian Clinical Nutrition RD Inpatient Contact Info in Amion

## 2023-03-04 NOTE — Progress Notes (Signed)
 [facility] Progress Note   Subjective:  Patient noted to be DNR s/p palliative care meeting.  Family is meeting with palliative care today for ongoing goals of care discussions.  He had 2.3 liters UF over 2/23 with CRRT.  Spoke with RN and his levo requirement has been stable for evening shift.  Goal has been systolic 100 or greater.  He has been following commands intermittently per nursing but when awake has tried to pull the tube out.  Has been on levo at 10 mcg/min and vasopressin at 0.03 units/min.   Review of systems:  Unable to obtain due to intubated      Objective Vitals:   03/04/23 0430 03/04/23 0431 03/04/23 0445 03/04/23 0500  BP:  (!) 117/13 (!) 109/22 (!) 113/18  Pulse:  (!) 167 (!) 157 (!) 167  Resp: 18 18 18 18   Temp:      TempSrc:      SpO2:  100% 100% 100%  Weight:    70 kg  Height:    5\' 7"  (1.702 m)   Physical Exam   General adult male in bed critically ill  HEENT normocephalic atraumatic  Neck supple trachea midline Lungs coarse mechanical breath sounds; FIO2 40 and PEEP 5 Heart S1S2 no rub Abdomen soft nontender nondistended Extremities no edema lower extremities Neuro sedated does not follow commands or wake to exam  Access left internal jugular tunn dialysis catheter  OP HD: SW TTS  4h  B400   67.5kg   2K bath  TDC   Heparin none - last HD 2/15, post wt 69kg, coming off 0.5- 1.5 kg over - good attendance, rarely stays on past 3.5 hrs - hectorol 3 mcg iv three times per week - parsabiv 7.5mg       CXR 2/17 - IMPRESSION: vascular congestion and probable mild interstitial edema. Small left effusion. 2. Chronic pleural and parenchymal scarring on the right. Similar peripheral bandlike opacities in the left mid to lower lung which could be due to chronic scarring or atelectasis.    Assessment/ Plan: Acute on chronic hypoxic resp failure - had HD 2/18 w/ 2.3 L UF, and SOB improved at that time.  Later on vent after cardiac arrest  on 2/20.   Cardiac arrest / fib and VT - occurred 2/20 while on dialysis. Seen by cardiology.  Per critical care Shock - per charting presumed septic due to aspiration PNA; post arrest, on pressors per critical care.  ESRD  On CRRT.  4K fluids with goal net negative 25 to 50 ml/hr Usually on HD TTS. Had 2 hrs HD prior to code 2/20. Worsening shock prompted CRRT which was started on 2/21.Note the concern that patient's heart may not be strong enough to tolerate regular dialysis.  Appreciate palliative care  Secondary hyperparathyroidism - hectorol held earlier for elevated corrected calcium. Off binders on CRRT Anemia of esrd - Hb acceptable; no current indication for ESA End-stage bivent CHF - EF 20% then later was up 35%. No options per cardiology 06/2022  COPD - note decreased respiratory reserve; usually on home O2 2.5- 3 L [place] H/o CVA - noted   Disposition - in ICU on CRRT   Recent Labs  Lab 03/03/23 0313 03/03/23 1547 03/04/23 0327  HGB 11.2*  --  10.8*  ALBUMIN 2.1* 2.0* 2.0*  CALCIUM 8.9 8.7* 8.7*  PHOS 3.2 2.8 2.6  CREATININE 2.63* 2.23* 1.96*  K 4.8 4.7 4.5    Inpatient medications:  apixaban  5 mg Per Tube BID   Chlorhexidine Gluconate Cloth  6 each Topical Daily   docusate  100 mg Per Tube BID   feeding supplement (PROSource TF20)  60 mL Per Tube Daily   hydrocortisone sod succinate (SOLU-CORTEF) inj  100 mg Intravenous Q12H   insulin aspart  0-6 Units Subcutaneous Q4H   mexiletine  200 mg Per Tube Q8H   midodrine  5 mg Per Tube 3 times per day on Sunday Monday Wednesday Friday   mouth rinse  15 mL Mouth Rinse Q2H   pantoprazole (PROTONIX) IV  40 mg Intravenous Q24H   polyethylene glycol  17 g Per Tube Daily    feeding supplement (OSMOLITE 1.5 CAL) 20 mL/hr at 03/04/23 0500   heparin sodium (porcine)     norepinephrine (LEVOPHED) Adult infusion 10 mcg/min (03/04/23 0500)   piperacillin-tazobactam 3.375 g (03/04/23 0504)   prismasol BGK 4/2.5 400 mL/hr at  03/04/23 0509   prismasol BGK 4/2.5 400 mL/hr at 03/04/23 0508   prismasol BGK 4/2.5 1,400 mL/hr at 03/04/23 0357   vancomycin Stopped (03/03/23 1643)   vasopressin 0.03 Units/min (03/04/23 0500)   acetaminophen (TYLENOL) oral liquid 160 mg/5 mL, fentaNYL (SUBLIMAZE) injection, fentaNYL (SUBLIMAZE) injection, heparin, heparin sodium (porcine), ipratropium-albuterol, midazolam, mouth rinse, prochlorperazine     Estanislado Emms, MD 5:56 AM 03/04/2023

## 2023-03-04 NOTE — Patient Outreach (Signed)
 Care Management  Transitions of Care Program Managed Medicaid Transitions of Care week 4  03/04/2023 Name: Nathaniel White MRN: [id number] DOB: July 28, 1960  Subjective: Nathaniel White is a 63 y.o. year old male who is a primary care patient of Claiborne Rigg, NP. The Care Management team was unable to reach the patient by phone to assess and address transitions of care needs.   Plan: Will reach out to patient to re-engage and re-enroll into Transition of Care 30day program after patient discharges from hospital  Arizbeth Cawthorn A. Mliss Fritz RN, BA, [facility], CRRN [facility] RN Care Manager, Transition of Care [phone]

## 2023-03-04 NOTE — Progress Notes (Signed)
 Pharmacy Antibiotic Note  Nathaniel White is a 63 y.o. male admitted on 02/25/2023 with pneumonia. Pharmacy has been consulted for vancomycin and Zosyn dosing. Pt is ESRD on HD TTS now with pressor requirements on CRRT.  -WBC= 9.3, afebrile -resp cultures with normal flora -MRSA PCR negative  Plan: Vancomycin 750mg  IV q24h; consider discontinuing? Zosyn 3.375g IV q6h  Will follow cultures and clinical progress   Height: 5\' 7"  (170.2 cm) Weight: 70 kg (154 lb 5.2 oz) IBW/kg (Calculated) : 66.1  Temp (24hrs), Avg:98.2 F (36.8 C), Min:97.4 F (36.3 C), Max:98.8 F (37.1 C)  Recent Labs  Lab 02/28/23 0221 02/28/23 0237 02/28/23 0319 02/28/23 0432 03/01/23 1059 03/01/23 2025 03/02/23 0246 03/02/23 1602 03/03/23 0313 03/03/23 1547 03/04/23 0327  WBC 10.4  --   --   --  9.7  --  11.0*  --  10.0  --  9.3  CREATININE 6.15*  --    < >  --  9.20*   < > 5.44* 3.44* 2.63* 2.23* 1.96*  LATICACIDVEN  --  6.5*  --  3.9* 1.1  --   --   --   --   --   --    < > = values in this interval not displayed.    Estimated Creatinine Clearance: 37.5 mL/min (A) (by C-G formula based on SCr of 1.96 mg/dL (H)).    Allergies  Allergen Reactions   Codeine Shortness Of Breath   Dextromethorphan-Guaifenesin Shortness Of Breath and Other (See Comments)   Iron Dextran Shortness Of Breath and Other (See Comments)    Pt does not recall 02/05/23   Morphine And Codeine Shortness Of Breath    Other Reaction(s): Other (See Comments)    Antimicrobials this admission: Vancomycin 2/21 >>  Zosyn 2/21 >>   Dose adjustments this admission: N/a  Microbiology results: 2/21- resp- normal flora  Harland German, PharmD Clinical Pharmacist **Pharmacist phone directory can now be found on [email or web address] ([email or web address]).  Listed under [facility].

## 2023-03-04 NOTE — Progress Notes (Signed)
 Daily Progress Note   Patient Name: Nathaniel White       Date: 03/04/2023 DOB: 1960/03/12  Age: 63 y.o. MRN#: [id number] Attending Physician: Steffanie Dunn, DO Primary Care Physician: Claiborne Rigg, NP Admit Date: 02/25/2023  Reason for Consultation/Follow-up: Establishing goals of care  Subjective: I have reviewed medical records including EPIC notes, MAR, any available advanced directives as necessary, and labs. Patient still intubated; remains on levo and vaso for hypotension. CRRT continued. Failed SBT this morning. Received report from primary RN - no acute concerns.   Went to visit patient at bedside - no family/visitors present. Patient was lying in bed intubated. No signs or non-verbal gestures of pain or discomfort noted. No respiratory distress, increased work of breathing, or secretions noted.   12:00 PM Met family in [facility] conference room for scheduled follow up meeting - mother/Ellen, aunt/Mimie, father/Tommie, son/Will, cousin present in person and sister/HCPOA/[name] present via facetime. Roommate/HCPOA #2/Kimberly present; however, opts to remain in hallway and receive updates from family after the meeting.  Emotional support provided to family. Paul Hoffman-NP/CCM joined [employer]. He provided medical updates and interval history since last PMT/CCM meeting. Unfortunately, patient has not shown improvement despite aggressive interventions. Family understand patient's current acute medical situation in context of his underlying chronic diseases. Family understand patient's prognosis is poor. All family's questions were answered.   Again reviewed options of continued aggressive medical interventions vs transition to full comfort measures. Reviewed at this time life supportive  interventions are likely prolonging patient's death, not restoring life. Recommendation was given for comfort care. Family inquire what transition to full comfort would entail.  We talked about transition to comfort measures in house and what that would entail inclusive of medications to control pain, dyspnea, agitation, nausea, and itching. We discussed stopping all unnecessary measures such as MV, CRRT, pressors, coretrak, blood draws, needle sticks, oxygen, antibiotics, CBGs/insulin, cardiac monitoring, IVF, and frequent vital signs. Education provided that other non-pharmacological interventions would be utilized for holistic support and comfort such as spiritual support if requested, repositioning, music therapy, offering comfort feeds, and/or therapeutic listening. All care would focus on how the patient is looking and feeling. Prognosis reviewed per their request - likely minutes after extubation.  Marylene Land questions if family could wait to make decision for his transition to full comfort until  she arrives back in town this Saturday. Discussed that medical team could do this; however, questioned if family felt it would be worth prolonging patient's potential suffering for 5 days.   Allowed space and time for family to discuss information given to them today. They are understandably tearful. After discussion, family are ready to move forward with full comfort measures and de-escalation of life prolonging interventions; however, they request time for family/friends to visit prior to official transition, which is reasonable. Recommended scheduling a date/time so medical team can support this goal; also so if family wish to be present at time of extubation, can plan for this. Discussed that Marylene Land can video call patient via monitor in ICU room as an option for visitation - she is appreciative of this.  Visit also consisted of discussions dealing with the complex and emotionally intense issues of symptom  management and palliative care in the setting of serious and life-threatening illness.  All questions and concerns addressed. Encouraged to call with questions and/or concerns. PMT card previously provided.  Discussed symptom management with primary RN and Renae Fickle Hoffman-NP/CCM - RN requesting medication for patient's intermittent discomfort. With consideration goal is comfort transition over the coming days, will order dilaudid PRN for pain/sedation. Patient is somewhat reactive and family might find this meaningful. If PRN doses do not provide adequate symptom management, can consider initiation of continuous opioid infusion.    Length of Stay: 6  Current Medications: Scheduled Meds:   apixaban  5 mg Per Tube BID   Chlorhexidine Gluconate Cloth  6 each Topical Daily   docusate  100 mg Per Tube BID   feeding supplement (PROSource TF20)  60 mL Per Tube Daily   hydrocortisone sod succinate (SOLU-CORTEF) inj  100 mg Intravenous Q12H   insulin aspart  0-6 Units Subcutaneous Q4H   mexiletine  200 mg Per Tube Q8H   midodrine  5 mg Per Tube 3 times per day on Sunday Monday Wednesday Friday   mouth rinse  15 mL Mouth Rinse Q2H   pantoprazole (PROTONIX) IV  40 mg Intravenous Q24H   polyethylene glycol  17 g Per Tube Daily    Continuous Infusions:  feeding supplement (OSMOLITE 1.5 CAL) 30 mL/hr at 03/04/23 1200   heparin sodium (porcine)     norepinephrine (LEVOPHED) Adult infusion 10 mcg/min (03/04/23 1200)   piperacillin-tazobactam Stopped (03/04/23 0534)   prismasol BGK 4/2.5 400 mL/hr at 03/04/23 0509   prismasol BGK 4/2.5 400 mL/hr at 03/04/23 0508   prismasol BGK 4/2.5 1,400 mL/hr at 03/04/23 1008   vancomycin Stopped (03/03/23 1643)   vasopressin 0.03 Units/min (03/04/23 1200)    PRN Meds: acetaminophen (TYLENOL) oral liquid 160 mg/5 mL, fentaNYL (SUBLIMAZE) injection, fentaNYL (SUBLIMAZE) injection, heparin, heparin sodium (porcine), ipratropium-albuterol, midazolam, mouth rinse,  prochlorperazine  Physical Exam Vitals and nursing note reviewed.  Constitutional:      General: He is not in acute distress.    Appearance: He is ill-appearing.     Interventions: He is intubated.  Pulmonary:     Effort: No respiratory distress. He is intubated.  Skin:    General: Skin is warm and dry.  Neurological:     Motor: Weakness present.             Vital Signs: BP (!) 117/43   Pulse 84   Temp 98.8 F (37.1 C) (Axillary)   Resp 18   Ht 5\' 7"  (1.702 m)   Wt 70 kg   SpO2 99%   BMI 24.17  kg/m  SpO2: SpO2: 99 % O2 Device: O2 Device: Ventilator O2 Flow Rate: O2 Flow Rate (L/min): 3 L/min  Intake/output summary:  Intake/Output Summary (Last 24 hours) at 03/04/2023 1207 Last data filed at 03/04/2023 1200 Gross per 24 hour  Intake 1466.63 ml  Output 2230 ml  Net -763.37 ml   LBM: Last BM Date : 03/04/23 Baseline Weight: Weight:  (In ER stretcher that is not zero'd out) Most recent weight: Weight: 70 kg       Palliative Assessment/Data: PPS 30% with tube feeds      Patient Active Problem List   Diagnosis Date Noted   On mechanically assisted ventilation (HCC) 03/01/2023   Aspiration pneumonia of both lower lobes due to gastric secretions (HCC) 03/01/2023   Shock (HCC) 03/01/2023   Cardiac arrest (HCC) 02/28/2023   Fluid overload 02/26/2023   Dyspnea 02/05/2023   Acute exacerbation of CHF (congestive heart failure) (HCC) 01/15/2023   CAP (community acquired pneumonia) 01/15/2023   Hyperkalemia 01/15/2023   Chronic ITP (idiopathic thrombocytopenia) (HCC) 01/15/2023   Acute hypoxic respiratory failure (HCC) 01/15/2023   Respiratory failure (HCC) 01/15/2023   Hypokalemia 12/28/2022   Prolonged QT interval 12/28/2022   Chronic hypoxic respiratory failure (HCC) 12/28/2022   Chronic systolic CHF (congestive heart failure) (HCC) 12/28/2022   Elevated troponin 12/28/2022   Acute bronchitis due to Rhinovirus 12/28/2022   First degree heart block 09/17/2022    Puncture wound of left knee with complication 09/15/2022   Open wound of penis with complication 09/15/2022   Sepsis secondary to penile infection 09/15/2022   Diabetes mellitus without complication (HCC) 09/15/2022   Encounter for screening for respiratory tuberculosis 08/20/2022   Mitral regurgitation 07/03/2022   Ventricular tachycardia, unspecified (HCC) 06/20/2022   Palpitations 06/19/2022   Volume overload 09/14/2021   COPD with acute exacerbation (HCC)    Pulmonary embolus, right (HCC) 09/07/2021   Severe pulmonary hypertension (HCC) 09/07/2021   End-stage renal disease on hemodialysis (HCC) 09/07/2021   Diabetes mellitus type 2, controlled, without complications (HCC) 09/07/2021   Anemia of chronic disease 09/07/2021   SOB (shortness of breath) 09/04/2021   History of pulmonary embolism 09/04/2021   Pulmonary embolism (HCC) 09/04/2021   NSVT (nonsustained ventricular tachycardia) (HCC) 06/26/2021   Hypoglycemia 06/24/2021   Cirrhosis (HCC) 06/23/2021   Confusion 06/22/2021   Necrotizing pneumonia (HCC) 06/21/2021   CVA (cerebral vascular accident) (HCC) 06/21/2021   Paroxysmal atrial fibrillation (HCC) 06/21/2021   Chronic hypotension 06/21/2021   Unusual Appearance with Innumerable Lesions on CT scan, needs MRI 06/21/2021   Aortic atherosclerosis (HCC) 06/21/2021   ESRD (end stage renal disease) (HCC) 06/20/2021   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 (HCC) 06/20/2021   Nausea with vomiting, unspecified 06/15/2021   Acute on chronic hypoxic respiratory failure (HCC) 03/10/2021   Shock circulatory (HCC) 03/10/2021   Hemodialysis-associated hypotension 03/10/2021   Finger infection    Paronychia of finger, left 02/03/2021   Left hand pain 12/29/2020   Allergy, unspecified, initial encounter 09/28/2019   Anaphylactic shock, unspecified, initial encounter 09/28/2019   Pressure ulcer of ankle 09/01/2018   Thrombocytopenia (HCC) 01/17/2015    Transfusion history 01/17/2015   Hypercalcemia 11/01/2014   Dependence on renal dialysis (HCC) 03/26/2014   Encounter for fitting and adjustment of extracorporeal dialysis catheter (HCC) 03/26/2014   Diarrhea, unspecified 01/03/2014   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/10/2013   Pain, unspecified 09/23/2013   Cough 09/07/2013   HFrEF (heart failure with reduced ejection fraction) (HCC) 08/14/2013  Coagulation defect, unspecified (HCC) 12/15/2012   Moderate protein-calorie malnutrition (HCC) 08/07/2012   Restrictive lung disease 07/06/2012   History of CVA (cerebrovascular accident) 08/13/2011   ESRD on dialysis (HCC) 03/13/2011   HTN (hypertension) 03/13/2011   H/O 03/13/2011   Anemia 03/13/2011   Fluid overload, unspecified 03/13/2011   History of nonadherence to medical treatment 03/13/2011   Non-insulin dependent type 2 diabetes mellitus (HCC) 03/13/2011   Tobacco abuse 03/13/2011   Kidney transplant status 11/28/2008   HEMATOCHEZIA 10/04/2008   Iron deficiency anemia, unspecified 03/31/2003   Patient's noncompliance with other medical treatment and regimen 03/31/2003   Personal history of nicotine dependence 03/31/2003   Secondary hyperparathyroidism of renal origin (HCC) 03/31/2003    Palliative Care Assessment & Plan   Patient Profile: 63 y.o. male with past medical history of ESRD on HD TTS, COPD, chronic hypoxemic respiratory failure on 2.5 L home oxygen, chronic HFrEF (EF 35-40% 12/2022), severe mitral regurgitation, paroxysmal A-fib on Eliquis, chronic hypotension on midodrine, chronic anemia, PVD, CVA/TIA was admitted on 02/25/2023 with acute on chronic hypoxemic respiratory failure secondary to volume overload/pulmonary edema in setting of ESRD on HD, acute on chronic HFrEF and known severe mitral regurgitation, hypocalcemia, metabolic acidosis, elevated troponin, QT prolongation, end stage NICM (EF 30-35%) with chronic hypotension. His hospital course was  complicated by code blue event on 2/21 - during HD patient went into VF arrest while receiving HD. CPR x4 minutes with ACLS medications administered. ROSC was achieved; however, on two additional occasions, patient rhythm deteriorated into monomorphic VT and he was shocked x 2. Decision was made to intubate.   Assessment: Principal Problem:   Fluid overload Active Problems:   ESRD on dialysis (HCC)   Acute on chronic hypoxic respiratory failure (HCC)   Paroxysmal atrial fibrillation (HCC)   Volume overload   Acute exacerbation of CHF (congestive heart failure) (HCC)   Cardiac arrest (HCC)   On mechanically assisted ventilation (HCC)   Aspiration pneumonia of both lower lobes due to gastric secretions (HCC)   Shock (HCC)   Concern about end of life  Recommendations/Plan: Continue current plan of care with goal to keep patient stable for family/friends to visit  Plan for compassionate extubation/comfort care transition over the coming days. Family to call PMT with date/time they will be ready Continue DNR as previously documented Unrestricted visitation orders were placed per current [place] [facility] visitation policy  PMT will continue to follow and support holistically  Symptom Management Started dilaudid IV PRN pain/sedation. Patient is somewhat reactive/interactive and family might find this meaningful. If PRN doses do not provide adequate symptom management, can consider initiation of continuous opioid infusion   Goals of Care and Additional Recommendations: Limitations on Scope of Treatment: Full Comfort Care and No Tracheostomy  Code Status:    Code Status Orders  (From admission, onward)           Start     Ordered   03/02/23 1428  Do not attempt resuscitation (DNR) Pre-Arrest Interventions Desired  (Code Status)  Continuous       Question Answer Comment  If pulseless and not breathing No CPR or chest compressions.   In Pre-Arrest Conditions (Patient Has Pulse and Is  Breathing) May intubate, use advanced airway interventions and cardioversion/ACLS medications if appropriate or indicated. May transfer to ICU.   Consent: Discussion documented in EHR or advanced directives reviewed      03/02/23 1428           Code  Status History     Date Active Date Inactive Code Status Order ID Comments User Context   02/26/2023 0636 03/02/2023 1428 Full Code [id number]  John Giovanni, MD ED   02/05/2023 0254 02/06/2023 1844 Full Code [id number]  Darlin Drop, DO ED   01/15/2023 0909 01/19/2023 2138 Full Code [id number]  Minor, Vilinda Blanks, NP ED   01/15/2023 0450 01/15/2023 0909 Full Code [id number]  Tereasa Coop, MD ED   12/27/2022 2358 01/01/2023 1946 Full Code [id number]  Howerter, Chaney Born, DO ED   11/29/2022 1031 11/30/2022 2212 Full Code [id number]  Lurline Del, MD ED   09/15/2022 1243 09/24/2022 2333 Full Code [id number]  Alfredo Martinez, MD ED   06/22/2022 1739 06/28/2022 2159 DNR [id number]  Ernie Avena, NP Inpatient   06/20/2022 0033 06/22/2022 1739 Full Code [id number]  Joellen Jersey., MD ED   09/13/2021 2350 09/17/2021 2224 Full Code [id number]  Howerter, Chaney Born, DO ED   09/04/2021 2104 09/08/2021 1824 Full Code [id number]  Eduard Clos, MD Inpatient   06/20/2021 0942 06/26/2021 2359 Full Code [id number]  Graceann Congress, PA-C Inpatient   07/21/2018 1138 07/21/2018 1714 Full Code [id number]  Maeola Harman, MD Inpatient   11/10/2013 1812 11/11/2013 2126 Full Code [id number]  Raymond Gurney, PA-C Inpatient   10/09/2013 1343 10/09/2013 2026 Full Code [id number]  Kathleene Hazel, MD Inpatient   08/10/2011 1702 08/19/2011 1428 Full Code [id number]  Alonna Buckler, RN Inpatient   03/13/2011 0647 03/14/2011 1849 Full Code [id number]  Eduard Clos, MD Inpatient      Advance Directive Documentation    Flowsheet Row Most Recent Value  Type of Advance Directive Living will, Healthcare Power of Attorney  Pre-existing out of facility DNR  order (yellow form or pink MOST form) --  "MOST" Form in Place? --       Prognosis:  Poor  Discharge Planning: Anticipated Hospital Death  Care plan was discussed with primary RN, patient's family, Dr. Chestine Spore, Jonna Clark  Thank you for allowing the Palliative Medicine Team to assist in the care of this patient.   Total Time 65 minutes Prolonged Time Billed  yes       Haskel Khan, NP  Please contact Palliative Medicine Team phone at [phone] for questions and concerns.   *Portions of this note are a verbal dictation therefore any spelling and/or grammatical errors are due to the "[employer]" system interpretation.

## 2023-03-04 NOTE — Plan of Care (Signed)
  Problem: Education: Goal: Knowledge of General Education information will improve Description: Including pain rating scale, medication(s)/side effects and non-pharmacologic comfort measures Outcome: Not Progressing   Problem: Health Behavior/Discharge Planning: Goal: Ability to manage health-related needs will improve Outcome: Not Progressing   Problem: Clinical Measurements: Goal: Ability to maintain clinical measurements within normal limits will improve Outcome: Not Progressing Goal: Will remain free from infection Outcome: Progressing Goal: Diagnostic test results will improve Outcome: Not Progressing Goal: Respiratory complications will improve Outcome: Not Progressing Goal: Cardiovascular complication will be avoided Outcome: Not Progressing   Problem: Activity: Goal: Risk for activity intolerance will decrease Outcome: Not Progressing   Problem: Nutrition: Goal: Adequate nutrition will be maintained Outcome: Progressing   Problem: Coping: Goal: Level of anxiety will decrease Outcome: Not Progressing   Problem: Elimination: Goal: Will not experience complications related to bowel motility Outcome: Progressing Goal: Will not experience complications related to urinary retention Outcome: Progressing   Problem: Pain Managment: Goal: General experience of comfort will improve and/or be controlled Outcome: Not Progressing   Problem: Safety: Goal: Ability to remain free from injury will improve Outcome: Progressing   Problem: Skin Integrity: Goal: Risk for impaired skin integrity will decrease Outcome: Not Progressing   Problem: Activity: Goal: Ability to tolerate increased activity will improve Outcome: Not Progressing   Problem: Respiratory: Goal: Ability to maintain a clear airway and adequate ventilation will improve Outcome: Not Progressing   Problem: Role Relationship: Goal: Method of communication will improve Outcome: Not Progressing   Problem:  Safety: Goal: Non-violent Restraint(s) Outcome: Progressing   Problem: Education: Goal: Ability to describe self-care measures that may prevent or decrease complications (Diabetes Survival Skills Education) will improve Outcome: Not Progressing Goal: Individualized Educational Video(s) Outcome: Not Progressing   Problem: Coping: Goal: Ability to adjust to condition or change in health will improve Outcome: Not Progressing   Problem: Fluid Volume: Goal: Ability to maintain a balanced intake and output will improve Outcome: Progressing   Problem: Health Behavior/Discharge Planning: Goal: Ability to identify and utilize available resources and services will improve Outcome: Not Progressing Goal: Ability to manage health-related needs will improve Outcome: Not Progressing   Problem: Metabolic: Goal: Ability to maintain appropriate glucose levels will improve Outcome: Progressing   Problem: Nutritional: Goal: Maintenance of adequate nutrition will improve Outcome: Progressing Goal: Progress toward achieving an optimal weight will improve Outcome: Progressing   Problem: Skin Integrity: Goal: Risk for impaired skin integrity will decrease Outcome: Not Progressing   Problem: Tissue Perfusion: Goal: Adequacy of tissue perfusion will improve Outcome: Not Progressing

## 2023-03-05 DIAGNOSIS — I428 Other cardiomyopathies: Secondary | ICD-10-CM | POA: Diagnosis not present

## 2023-03-05 DIAGNOSIS — J9601 Acute respiratory failure with hypoxia: Secondary | ICD-10-CM | POA: Diagnosis not present

## 2023-03-05 DIAGNOSIS — N186 End stage renal disease: Secondary | ICD-10-CM | POA: Diagnosis not present

## 2023-03-05 DIAGNOSIS — Z9911 Dependence on respirator [ventilator] status: Secondary | ICD-10-CM

## 2023-03-05 DIAGNOSIS — Z66 Do not resuscitate: Secondary | ICD-10-CM | POA: Diagnosis not present

## 2023-03-05 DIAGNOSIS — Z7189 Other specified counseling: Secondary | ICD-10-CM | POA: Diagnosis not present

## 2023-03-05 DIAGNOSIS — J690 Pneumonitis due to inhalation of food and vomit: Secondary | ICD-10-CM | POA: Diagnosis not present

## 2023-03-05 DIAGNOSIS — E8770 Fluid overload, unspecified: Secondary | ICD-10-CM | POA: Diagnosis not present

## 2023-03-05 DIAGNOSIS — Z515 Encounter for palliative care: Secondary | ICD-10-CM | POA: Diagnosis not present

## 2023-03-05 LAB — RENAL FUNCTION PANEL
Albumin: 2.1 g/dL — ABNORMAL LOW (ref 3.5–5.0)
Albumin: 2.2 g/dL — ABNORMAL LOW (ref 3.5–5.0)
Anion gap: 15 (ref 5–15)
Anion gap: 7 (ref 5–15)
BUN: 23 mg/dL — ABNORMAL HIGH (ref 6–20)
BUN: 22 mg/dL — ABNORMAL HIGH (ref 6–20)
CO2: 24 mmol/L (ref 22–32)
CO2: 25 mmol/L (ref 22–32)
Calcium: 8.6 mg/dL — ABNORMAL LOW (ref 8.9–10.3)
Calcium: 8.8 mg/dL — ABNORMAL LOW (ref 8.9–10.3)
Chloride: 97 mmol/L — ABNORMAL LOW (ref 98–111)
Chloride: 102 mmol/L (ref 98–111)
Creatinine, Ser: 1.89 mg/dL — ABNORMAL HIGH (ref 0.61–1.24)
Creatinine, Ser: 1.74 mg/dL — ABNORMAL HIGH (ref 0.61–1.24)
GFR, Estimated: 40 mL/min — ABNORMAL LOW (ref >60)
GFR, Estimated: 44 mL/min — ABNORMAL LOW (ref >60)
Glucose, Bld: 164 mg/dL — ABNORMAL HIGH (ref 70–99)
Glucose, Bld: 152 mg/dL — ABNORMAL HIGH (ref 70–99)
Phosphorus: 2.3 mg/dL — ABNORMAL LOW (ref 2.5–4.6)
Phosphorus: 3.1 mg/dL (ref 2.5–4.6)
Potassium: 4.4 mmol/L (ref 3.5–5.1)
Potassium: 4.3 mmol/L (ref 3.5–5.1)
Sodium: 136 mmol/L (ref 135–145)
Sodium: 134 mmol/L — ABNORMAL LOW (ref 135–145)

## 2023-03-05 LAB — GLUCOSE, CAPILLARY
Glucose-Capillary: 168 mg/dL — ABNORMAL HIGH (ref 70–99)
Glucose-Capillary: 106 mg/dL — ABNORMAL HIGH (ref 70–99)
Glucose-Capillary: 146 mg/dL — ABNORMAL HIGH (ref 70–99)
Glucose-Capillary: 153 mg/dL — ABNORMAL HIGH (ref 70–99)
Glucose-Capillary: 159 mg/dL — ABNORMAL HIGH (ref 70–99)

## 2023-03-05 LAB — MAGNESIUM: Magnesium: 2.4 mg/dL (ref 1.7–2.4)

## 2023-03-05 MED ORDER — SODIUM PHOSPHATES 45 MMOLE/15ML IV SOLN
15.0000 mmol | Freq: Once | INTRAVENOUS | Status: AC
  Administered 2023-03-05: 15 mmol via INTRAVENOUS
  Filled 2023-03-05: qty 5

## 2023-03-05 MED ORDER — SODIUM CHLORIDE 0.9 % IV SOLN: INTRAVENOUS | Status: AC | PRN

## 2023-03-05 NOTE — Progress Notes (Signed)
 Daily Progress Note   Patient Name: Nathaniel White       Date: 03/05/2023 DOB: 04/02/1960  Age: 63 y.o. MRN#: [id number] Attending Physician: Nathaniel Dunn, DO Primary Care Physician: Nathaniel Rigg, NP Admit Date: 02/25/2023 Length of Stay: 7 days  Reason for Consultation/Follow-up: Establishing goals of care  HPI/Patient Profile:  63 y.o. male with past medical history of ESRD on HD TTS, COPD, chronic hypoxemic respiratory failure on 2.5 L home oxygen, chronic HFrEF (EF 35-40% 12/2022), severe mitral regurgitation, paroxysmal A-fib on Eliquis, chronic hypotension on midodrine, chronic anemia, PVD, CVA/TIA was admitted on 02/25/2023 with acute on chronic hypoxemic respiratory failure secondary to volume overload/pulmonary edema in setting of ESRD on HD, acute on chronic HFrEF and known severe mitral regurgitation, hypocalcemia, metabolic acidosis, elevated troponin, QT prolongation, end stage NICM (EF 30-35%) with chronic hypotension. His hospital course was complicated by code blue event on 2/21 - during HD patient went into VF arrest while receiving HD. CPR x4 minutes with ACLS medications administered. ROSC was achieved; however, on two additional occasions, patient rhythm deteriorated into monomorphic VT and he was shocked x 2. Decision was made to intubate.   Subjective:   Subjective: Chart Reviewed. Updates received. Patient Assessed. Created space and opportunity for patient  and family to explore thoughts and feelings regarding current medical situation.  Today's Discussion: Today saw the patient at bedside, no family was present.  The patient was intubated, sedated, unable to meaningfully interact.  I spent time discussing care and plan with Nathaniel Roach, NP and the bedside nurse.  The plan as previously discussed by my colleague Nathaniel Cooter, NP was eventual transition to comfort care when family is ready.  Nathaniel White spoke with the patient's mom this morning.  Mom and son are deciding the  timeframe, Nathaniel White is currently in [place] and they are coordinating.  Mom will be coming to the hospital tomorrow and planning to meet to further discuss timing of transition to comfort care.  I requested the PCCM team to let me know when the patient's mom arrives and I will make every attempt to be there.  Nurse states that she will also try to confirm her narrow down a time with the patient's mother when she likely calls later today for an update.  Review of Systems  Unable to perform ROS: Intubated    Objective:   Vital Signs:  BP (!) 118/38   Pulse 81   Temp 97.8 F (36.6 C) (Axillary)   Resp 16   Ht 5\' 7"  (1.702 m)   Wt 66.2 kg   SpO2 100%   BMI 22.86 kg/m   Physical Exam Vitals and nursing note reviewed.  Constitutional:      General: He is not in acute distress.    Appearance: He is ill-appearing.     Interventions: He is sedated and intubated.  HENT:     Head: Normocephalic and atraumatic.  Cardiovascular:     Rate and Rhythm: Normal rate.  Pulmonary:     Effort: Pulmonary effort is normal. No respiratory distress. He is intubated.  Abdominal:     General: Abdomen is flat.  Skin:    General: Skin is warm and dry.     Palliative Assessment/Data: 10%    Existing Vynca/ACP Documentation: Advance directive signed 06/26/2022  Assessment & Plan:   Impression: Present on Admission:  Fluid overload  Acute on chronic hypoxic respiratory failure (HCC)  Paroxysmal atrial fibrillation (HCC)  Volume overload  SUMMARY OF  RECOMMENDATIONS   Continue current scope of care to keep stable for family/friends to visit Plan for compassionate extubation/comfort care transition in the coming days Anticipate family meeting tomorrow to further discuss timing of transition to comfort Palliative medicine will continue to follow  Symptom Management:  Per primary team PMT is available to assist with transition to comfort care  Code Status: DNR-interventions desired (currently  intubated)  Prognosis: Hours - Days  Discharge Planning: Anticipated Hospital Death  Discussed with: Medical team, nursing team  Thank you for allowing [place] to participate in the care of Nathaniel White PMT will continue to support holistically.  Time Total: 32 min  Detailed review of medical records (labs, imaging, vital signs), medically appropriate exam, discussed with treatment team, counseling and education to patient, family, & staff, documenting clinical information, medication management, coordination of care  Nathaniel Dust, NP Palliative Medicine Team  Team Phone # [phone] (Nights/Weekends)  09/06/2020, 8:17 AM

## 2023-03-05 NOTE — Progress Notes (Signed)
 [facility] Progress Note   Subjective:  He has continued on CRRT.  He had 2.7 kg UF over 2/24 with CRRT.  Per palliative care note continue current plan of care with goal of keeping patient stable for family and friends to visit; note they indicate plans are for compassionate extubation and transition to comfort care in the coming days.  Spoke with RN - she has been pulling 25 ml/hr most recently.  Note diastolic hypotension.   Review of systems:    Unable to obtain due to intubated      Objective Vitals:   03/05/23 0515 03/05/23 0520 03/05/23 0524 03/05/23 0530  BP: (!) 69/45 (!) 84/19 (!) 93/29 (!) 92/24  Pulse: 81 79 79   Resp: 18 20 18 18   Temp:      TempSrc:      SpO2: 100% 98% 99%   Weight:   66.2 kg   Height:   5\' 7"  (1.702 m)    Physical Exam    General adult male in bed critically ill  HEENT normocephalic atraumatic  Neck supple trachea midline Lungs coarse mechanical breath sounds; FIO2 40 and PEEP 5 Heart S1S2 no rub Abdomen soft nontender nondistended Extremities no edema lower extremities Neuro sedated does not follow commands or wake to exam  Access left internal jugular tunn dialysis catheter  OP HD: SW TTS  4h  B400   67.5kg   2K bath  TDC   Heparin none - last HD 2/15, post wt 69kg, coming off 0.5- 1.5 kg over - good attendance, rarely stays on past 3.5 hrs - hectorol 3 mcg iv three times per week - parsabiv 7.5mg       CXR 2/17 - IMPRESSION: vascular congestion and probable mild interstitial edema. Small left effusion. 2. Chronic pleural and parenchymal scarring on the right. Similar peripheral bandlike opacities in the left mid to lower lung which could be due to chronic scarring or atelectasis.    Assessment/ Plan: Acute on chronic hypoxic resp failure - had HD 2/18 w/ 2.3 L UF, and shortness of breath improved at that time.  Later on vent after cardiac arrest on 2/20.   Cardiac arrest / fib and VT - occurred 2/20 while on  dialysis. Seen by cardiology.  Per critical care Shock - per charting presumed septic due to aspiration PNA; post arrest, on pressors per critical care.  ESRD  On CRRT.  4K fluids.  Will reduce goal to keep even as tolerated  Usually on HD TTS. Had 2 hrs HD prior to code 2/20. Worsening shock prompted CRRT which was started on 2/21.Note the concern that patient's heart may not be strong enough to tolerate regular dialysis.  Appreciate palliative care  Secondary hyperparathyroidism - hectorol held earlier for elevated corrected calcium. Now off binders on CRRT Anemia of esrd - Hb acceptable; no current indication for ESA End-stage bivent CHF - EF 20% then later was up 35%. No options per cardiology 06/2022  COPD - note decreased respiratory reserve; usually on home O2 2.5- 3 L [place] per charting H/o CVA - noted   Disposition - in ICU on CRRT   Recent Labs  Lab 03/03/23 0313 03/03/23 1547 03/04/23 0327 03/04/23 1639 03/05/23 0324  HGB 11.2*  --  10.8*  --   --   ALBUMIN 2.1*   < > 2.0* 2.0* 2.1*  CALCIUM 8.9   < > 8.7* 8.6* 8.6*  PHOS 3.2   < > 2.6 2.5 2.3*  CREATININE 2.63*   < > 1.96* 1.87* 1.89*  K 4.8   < > 4.5 4.5 4.4   < > = values in this interval not displayed.    Inpatient medications:  apixaban  5 mg Per Tube BID   Chlorhexidine Gluconate Cloth  6 each Topical Daily   docusate  100 mg Per Tube BID   feeding supplement (PROSource TF20)  60 mL Per Tube TID   fiber supplement (BANATROL TF)  60 mL Per Tube BID   hydrocortisone sod succinate (SOLU-CORTEF) inj  100 mg Intravenous Q12H   insulin aspart  0-6 Units Subcutaneous Q4H   mexiletine  200 mg Per Tube Q8H   midodrine  5 mg Per Tube 3 times per day on Sunday Monday Wednesday Friday   mouth rinse  15 mL Mouth Rinse Q2H   pantoprazole (PROTONIX) IV  40 mg Intravenous Q24H   polyethylene glycol  17 g Per Tube Daily    feeding supplement (OSMOLITE 1.5 CAL) 40 mL/hr at 03/05/23 0500   heparin sodium (porcine)      norepinephrine (LEVOPHED) Adult infusion 10 mcg/min (03/05/23 0500)   piperacillin-tazobactam 3.375 g (03/05/23 0517)   prismasol BGK 4/2.5 400 mL/hr at 03/04/23 1819   prismasol BGK 4/2.5 400 mL/hr at 03/04/23 1819   prismasol BGK 4/2.5 1,400 mL/hr at 03/05/23 9604   vasopressin 0.03 Units/min (03/05/23 0500)   acetaminophen (TYLENOL) oral liquid 160 mg/5 mL, fentaNYL (SUBLIMAZE) injection, fentaNYL (SUBLIMAZE) injection, heparin, heparin sodium (porcine), HYDROmorphone (DILAUDID) injection, ipratropium-albuterol, midazolam, mouth rinse, prochlorperazine     Estanislado Emms, MD 5:58 AM 03/05/2023

## 2023-03-05 NOTE — Progress Notes (Addendum)
 NAME:  Nathaniel White, MRN:  [id number], DOB:  1960/04/03, LOS: 7 ADMISSION DATE:  02/25/2023 CONSULTATION DATE:  02/28/2023 REFERRING MD:  Jerral Ralph - TRH,  CHIEF COMPLAINT:  Cardiac arrest   History of Present Illness:  63 year old man who presented to [facility] 2/17 for SOB after truncated HD session (last 2/15). PMHx significant for HTN, HLD, CVA (with L-sided deficits), PVD, PAF (on Eliquis), HFrEF with NICM, severe MR, COPD (on 2-3L HOT), T2DM, ESRD on HD TTS. Recent admission 1/27-1/29 for volume overload/pulmonary edema.   Patient was reportedly slated for discharge 2/19 but remained in-house due to weather conditions and concern he would not be able to to have transport to HD 2/20; HD was completed overnight 2/19-2/20. While in HD 2/20AM, Code Blue was called for VF arrest. CPR x 4 minutes with Epi x 1, amio 150mg  and 300mg , lidocaine x 1, Mg/Ca repletion. ROSC was achieved and patient was awake and alert, repeatedly saying "help me" and "I can't breathe". NRB was attempted with ongoing extremis. On two additional occasions, patient rhythm deteriorated into monomorphic VT and he was shocked x 2. Decision was made to intubate.  PCCM consulted for ICU transfer and management.  Pertinent Medical History:   Past Medical History:  Diagnosis Date   Anemia    Anxiety    Asthma    Complication from renal dialysis device 11/02/2013   Complication of anesthesia 2017   according to pt and spouse pt was moving around and cough while under and pt had difficulty waking up so the anesthesia had to be reversed. and pt admitted to ICU.    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/2008   Qualifier: Diagnosis of  By: Russella Dar MD Marylu Lund    ESRD    on HD, T-TH-Sat - [place]    Hemiparesis due to old stroke [facility])    left   HFrEF (heart failure with reduced ejection fraction) (HCC)    NICM // TTE 06/2022: EF 25-30, sever RV dysfunction, mod to severe MR   Hypertension    Hx, not current problems, no meds   LV dysfunction    EF 25-30% by echo 07/2011   Memory loss due to medical condition    due to stroke   MR (mitral regurgitation)    moderate to severe, echo 07/2011   Nausea with vomiting, unspecified 06/15/2021   Paroxysmal atrial fibrillation (HCC)    Peripheral vascular disease (HCC)    Shortness of breath    with exertion   Stroke [facility])    TIA's-left sided weakness   Tobacco abuse     Significant Hospital Events: Including procedures, antibiotic start and stop dates in addition to other pertinent events   2/20 - Cardiac arrest in HD, initial rhythm VF with CPR x 2 rounds, Epi/Mg/Ca, shocked x 2; after ROSC, monomorphic VT requiring shock x 2. Intubated at bedside in dialysis. Transferred to [facility] CVICU. Cardiology consulted. NE weaned off during day  2/24 failed vent wean, remains on pressors not able to wean. CRRT keeping even. Family meeting leaning towards comfort care, just need to arrange time when family can all come.   Interim History / Subjective:  No acute events overnight Unable to make meaningful progress on vent/pressor wean Family  meeting yesterday with myself and palliative care determined eventual transition to comfort once family has had a chance to discuss timing.   Objective:  Blood pressure (!) 112/55, pulse 78, temperature 98.5 F (36.9 C), temperature source Axillary, resp. rate 18, height 5\' 7"  (1.702 m), weight 66.2 kg, SpO2 100%. CVP:  [9 mmHg-15 mmHg] 10 mmHg  Vent Mode: PRVC FiO2 (%):  [40 %] 40 % Set Rate:  [18 bmp] 18 bmp Vt Set:  [530 mL] 530 mL PEEP:  [5 cmH20] 5 cmH20 Plateau Pressure:  [28 cmH20-34 cmH20] 28 cmH20   Intake/Output Summary (Last 24 hours) at 03/05/2023 1610 Last data filed at 03/05/2023 0900 Gross per 24  hour  Intake 1897.28 ml  Output 3099 ml  Net -1201.72 ml   Filed Weights   02/28/23 0630 03/04/23 0500 03/05/23 0524  Weight: 70.2 kg 70 kg 66.2 kg   Physical Examination:  General Frail elderly appearing male in NAD HEENT  [place]/AT, cataracts Pulmonary: Clear bilateral breath sounds Cardiac RRR, no MRG Abdomen: Soft, NT, ND Extremities: No acute deformity Neuro   RASS -2   Na+ 136 BUN 23, Cr 1.89 on CRRT Trach aspirate culture: normal flora   Resolved Hospital Problem List:  Cardiogenic shock  VT/VF in-hospital cardiac arrest 2/20 Post cardiac arrest lactic acidosis  Assessment & Plan:    End-stage NICM (EF 30-35%) w/ chronic hypotension; intolerant to GDMT due to hypotension and renal failure Paroxysmal Afib and prolonged Qtc VT s/p in-hospital cardiac arrest 2/20 (TIME TO rosc 4 MIN) -due to concern for lidocaine toxicity, switched to mexilitine -due to Qtc prolongation, no amiodarone -avoid Qtc prolonging meds -Continue home midodrine -eliquis BID -GDMT limited by shock  Presumed septic shock due to aspiration pneumonia - continue zosyn for 7 day course stop 2/27 - Few GPC on sputum culture. > normal flora, DC vanco - NE and Vasopressin for MAP 65 - Stress steroids  Acute hypoxemic respiratory failure following VF arrest w/ bilateral airspace disease most likely reflecting pulmonary edema  Likely aspiration pneumonia from cardiac arrest -Full vent support LTVV -VAP prevention protocol -daily SAT & SBT, not making much progress unfortunately -ABX as above -keeping even with CRRT  ESRD on HD TTS; PCKD Hypervolemic hyponatremia -CRRT via TDC -strict I/O -appreciate nephrology  T2DM w/ hyperglycemia -SSI PRN -goal BG 140-180  Moderate protein calorie malnutrition -Tf, advancing slowly d/t vomiting. Keep at 30/mL/hr today -with Qtc need to be careful with antiemetics -avoid constipation  Constipation, improved -bowel regimen  Anemia -transfuse  for Hb< 7 or hemodynamically significant bleeding; no current indications  Goals of care: Meeting with multiple family members in person who called HCPOA over speaker phone. They view a transition to comfort care as the appropriate choice at this time and are convening amongst themselves to determine when this should take place.   Best Practice: (right click and "Reselect all SmartList Selections" daily)   Diet/type: tubefeeds DVT prophylaxis: DOAC GI prophylaxis: PPI Lines: Central line, Dialysis Catheter, and Arterial Line Foley:  N/A Code Status:  full code Last date of multidisciplinary goals of care discussion [2/22 with palliative care]  Critical care time: 38 minutes.     Joneen Roach, AGACNP-BC [facility]  See Amion for personal pager PCCM on call pager [phone] until 7pm. Please call Elink 7p-7a. [phone]  03/05/2023 9:26 AM

## 2023-03-06 DIAGNOSIS — J690 Pneumonitis due to inhalation of food and vomit: Secondary | ICD-10-CM | POA: Diagnosis not present

## 2023-03-06 DIAGNOSIS — R6521 Severe sepsis with septic shock: Secondary | ICD-10-CM

## 2023-03-06 DIAGNOSIS — Z7189 Other specified counseling: Secondary | ICD-10-CM | POA: Diagnosis not present

## 2023-03-06 DIAGNOSIS — J9601 Acute respiratory failure with hypoxia: Secondary | ICD-10-CM | POA: Diagnosis not present

## 2023-03-06 DIAGNOSIS — Z515 Encounter for palliative care: Secondary | ICD-10-CM | POA: Diagnosis not present

## 2023-03-06 DIAGNOSIS — E8770 Fluid overload, unspecified: Secondary | ICD-10-CM | POA: Diagnosis not present

## 2023-03-06 DIAGNOSIS — I5023 Acute on chronic systolic (congestive) heart failure: Secondary | ICD-10-CM

## 2023-03-06 DIAGNOSIS — A419 Sepsis, unspecified organism: Secondary | ICD-10-CM

## 2023-03-06 DIAGNOSIS — N186 End stage renal disease: Secondary | ICD-10-CM | POA: Diagnosis not present

## 2023-03-06 DIAGNOSIS — I428 Other cardiomyopathies: Secondary | ICD-10-CM | POA: Diagnosis not present

## 2023-03-06 LAB — GLUCOSE, CAPILLARY
Glucose-Capillary: 121 mg/dL — ABNORMAL HIGH (ref 70–99)
Glucose-Capillary: 124 mg/dL — ABNORMAL HIGH (ref 70–99)
Glucose-Capillary: 110 mg/dL — ABNORMAL HIGH (ref 70–99)
Glucose-Capillary: 160 mg/dL — ABNORMAL HIGH (ref 70–99)
Glucose-Capillary: 142 mg/dL — ABNORMAL HIGH (ref 70–99)

## 2023-03-06 LAB — RENAL FUNCTION PANEL
Albumin: 2.2 g/dL — ABNORMAL LOW (ref 3.5–5.0)
Albumin: 2.3 g/dL — ABNORMAL LOW (ref 3.5–5.0)
Anion gap: 10 (ref 5–15)
Anion gap: 11 (ref 5–15)
BUN: 25 mg/dL — ABNORMAL HIGH (ref 6–20)
BUN: 24 mg/dL — ABNORMAL HIGH (ref 6–20)
CO2: 25 mmol/L (ref 22–32)
CO2: 25 mmol/L (ref 22–32)
Calcium: 9.4 mg/dL (ref 8.9–10.3)
Calcium: 9.2 mg/dL (ref 8.9–10.3)
Chloride: 103 mmol/L (ref 98–111)
Chloride: 101 mmol/L (ref 98–111)
Creatinine, Ser: 1.64 mg/dL — ABNORMAL HIGH (ref 0.61–1.24)
Creatinine, Ser: 1.54 mg/dL — ABNORMAL HIGH (ref 0.61–1.24)
GFR, Estimated: 48 mL/min — ABNORMAL LOW (ref >60)
GFR, Estimated: 51 mL/min — ABNORMAL LOW (ref >60)
Glucose, Bld: 129 mg/dL — ABNORMAL HIGH (ref 70–99)
Glucose, Bld: 167 mg/dL — ABNORMAL HIGH (ref 70–99)
Phosphorus: 2.4 mg/dL — ABNORMAL LOW (ref 2.5–4.6)
Phosphorus: 2.9 mg/dL (ref 2.5–4.6)
Potassium: 4.2 mmol/L (ref 3.5–5.1)
Potassium: 4.2 mmol/L (ref 3.5–5.1)
Sodium: 138 mmol/L (ref 135–145)
Sodium: 137 mmol/L (ref 135–145)

## 2023-03-06 LAB — POCT I-STAT 7, (LYTES, BLD GAS, ICA,H+H)
Acid-Base Excess: 1 mmol/L (ref 0.0–2.0)
Bicarbonate: 26.1 mmol/L (ref 20.0–28.0)
Calcium, Ion: 1.26 mmol/L (ref 1.15–1.40)
HCT: 42 % (ref 39.0–52.0)
Hemoglobin: 14.3 g/dL (ref 13.0–17.0)
O2 Saturation: 94 %
Patient temperature: 97.1
Potassium: 4.5 mmol/L (ref 3.5–5.1)
Sodium: 138 mmol/L (ref 135–145)
TCO2: 27 mmol/L (ref 22–32)
pCO2 arterial: 42.1 mmHg (ref 32–48)
pH, Arterial: 7.397 (ref 7.35–7.45)
pO2, Arterial: 67 mmHg — ABNORMAL LOW (ref 83–108)

## 2023-03-06 LAB — MAGNESIUM: Magnesium: 2.5 mg/dL — ABNORMAL HIGH (ref 1.7–2.4)

## 2023-03-06 LAB — CBC
HCT: 39.7 % (ref 39.0–52.0)
Hemoglobin: 11.4 g/dL — ABNORMAL LOW (ref 13.0–17.0)
MCH: 25.1 pg — ABNORMAL LOW (ref 26.0–34.0)
MCHC: 28.7 g/dL — ABNORMAL LOW (ref 30.0–36.0)
MCV: 87.4 fL (ref 80.0–100.0)
Platelets: 141 10*3/uL — ABNORMAL LOW (ref 150–400)
RBC: 4.54 MIL/uL (ref 4.22–5.81)
RDW: 18.3 % — ABNORMAL HIGH (ref 11.5–15.5)
WBC: 7.8 10*3/uL (ref 4.0–10.5)
nRBC: 0 % (ref 0.0–0.2)

## 2023-03-06 MED ORDER — SODIUM CHLORIDE 0.9% FLUSH
10.0000 mL | Freq: Two times a day (BID) | INTRAVENOUS | Status: DC
  Administered 2023-03-06 – 2023-03-07 (×2): 20 mL
  Administered 2023-03-07 – 2023-03-09 (×4): 10 mL

## 2023-03-06 MED ORDER — GERHARDT'S BUTT CREAM
TOPICAL_CREAM | CUTANEOUS | Status: DC | PRN
  Administered 2023-03-06 – 2023-03-07 (×2): 1 via TOPICAL
  Filled 2023-03-06: qty 60

## 2023-03-06 MED ORDER — MIDODRINE HCL 5 MG PO TABS
10.0000 mg | ORAL_TABLET | Freq: Three times a day (TID) | ORAL | Status: DC
  Administered 2023-03-06 – 2023-03-07 (×3): 10 mg
  Filled 2023-03-06 (×3): qty 2

## 2023-03-06 MED ORDER — SODIUM CHLORIDE 0.9% FLUSH: 10.0000 mL | INTRAVENOUS | Status: DC | PRN

## 2023-03-06 MED ORDER — SODIUM PHOSPHATES 45 MMOLE/15ML IV SOLN
15.0000 mmol | Freq: Once | INTRAVENOUS | Status: AC
  Administered 2023-03-06: 15 mmol via INTRAVENOUS
  Filled 2023-03-06: qty 5

## 2023-03-06 NOTE — Progress Notes (Signed)
 Nutrition Follow-up  DOCUMENTATION CODES:  Not applicable  INTERVENTION:  Continue tube feeding via OGT: -Osmolite 1.5 at 50 ml/h (1200 ml per day) -Prosource TF20 60 ml TID   Provides 2040 kcal, 135 gm protein, 915 ml free water daily    Banatrol BID, as bulking agent. Each packet contains 5g of soluble fiber   NUTRITION DIAGNOSIS:  Inadequate oral intake related to inability to eat as evidenced by NPO status. - remains applicable  GOAL:  Patient will meet greater than or equal to 90% of their needs - being met with tube feeds  MONITOR:  Diet advancement, TF tolerance, Labs  REASON FOR ASSESSMENT:  Consult Assessment of nutrition requirement/status  ASSESSMENT:   Patient admitted to [facility] on 2023-02-26 with c/o SOB after shortened HD tx. Was slated to discharge 28-Feb-2023, but concerned about transport 2/2 weather conditions. Received another HD tx evening of 02-28-23 and coded half way into tx. Transferred to 2H, thereafter. PMH: HTN, HLD, CVA (L sided deficits), PVD, PAF, HFrEF, severe MR, COPD, T2DM, ESRD-HD.  26-Feb-2023 admitted for SOB Feb 28, 2023 coded on HD tx, intubated, OGT placed 2/20 TF initiated/advanced 2/21 emesis, TF held 2/22 trickles re-initiated overnight 2/24 TF advanced, failed vent wean 2/25 FMS placed  Patient awake, however not able to sustain off vent. Plan is likely for compassionate extubation in coming days. Timeline unclear. Remains on levophed and vasopressin and unable to wean.   Patient is currently intubated on ventilator support MV: 9.6 L/min Temp (24hrs), Avg:98 F (36.7 C), Min:97 F (36.1 C), Max:99.1 F (37.3 C)  Pt with no further emesis with tube feeds. Constipation has resolved and fecal management system now in place. +BM, BS, flatus.  Admit Weight: 68kg Current Weight: 66.7kg EDW: 67.5kg  Now below EDW. Remains on CRRT with goal to keep euvolemic. Stool output of yesterday. He is anuric at baseline.  Intake/Output Summary (Last 24 hours) at  03/06/2023 1423 Last data filed at 03/06/2023 1400 Gross per 24 hour  Intake 2340.08 ml  Output 1713.5 ml  Net 626.58 ml     Net IO Since Admission: -1,187.02 mL [03/06/23 1423]   Drains/Lines: OGT (proximal fundal - gastric) TDC R femoral cath   Meds: colace, SSI, pantoprazole, Miralax, IV ABX  Drips: Na Phos x1 Levo @ 8mcg/min  Vasopressin @ 0.03 units/min  Labs: Na+ 134>138 (wdl) CBGs 129-152 x48 hours  Diet Order:   Diet Order             Diet NPO time specified  Diet effective now            EDUCATION NEEDS:   Not appropriate for education at this time  Skin:  Skin Assessment: Reviewed RN Assessment  Last BM:  2/26 - type 7 via FMS  Height:  Ht Readings from Last 1 Encounters:  03/05/23 5\' 7"  (1.702 m)   Weight:  Wt Readings from Last 1 Encounters:  03/06/23 66.7 kg    Ideal Body Weight:  67.3 kg  BMI:  Body mass index is 23.03 kg/m.  Estimated Nutritional Needs:   Kcal:  2000-2200kcal  Protein:  130-145g  Fluid:  1L + UOP  Myrtie Cruise MS, RD, LDN Registered Dietitian Clinical Nutrition RD Inpatient Contact Info in Amion

## 2023-03-06 NOTE — Progress Notes (Signed)
 [facility] Progress Note   Subjective:  He has continued on CRRT.  He had 1.9 kg UF over 2/25 with CRRT.  He has been keep even as tolerated.  Per palliative care note continue current scope of care and note plans are for compassionate extubation and transition to comfort care in the coming days.  Spoke with RN and he is pulling off between 60-80 ml most hours to keep even.  Patient has been on levophed (increased from 8 mcg/min to 16 mcg over this shift) and vasopressin at 0.03 units/min.  RN states patient's CVP at 5 am was 5  Review of systems:     Unable to obtain due to intubated and sedated    Objective Vitals:   03/06/23 0516 03/06/23 0521 03/06/23 0526 03/06/23 0534  BP: (!) 52/37 (!) 62/19 (!) 84/33   Pulse: 76 76 78   Resp: 18 18 19    Temp:    99.1 F (37.3 C)  TempSrc:    Oral  SpO2: 100% 100% 100%   Weight:      Height:       Physical Exam     General adult male in bed critically ill  HEENT normocephalic atraumatic  Neck supple trachea midline Lungs coarse mechanical breath sounds, on vent Heart S1S2 no rub Abdomen soft nontender nondistended Extremities no edema lower extremities Neuro sedated does wake to exam Access left internal jugular tunn dialysis catheter  OP HD: SW TTS  4h  B400   67.5kg   2K bath  TDC   Heparin none - last HD 2/15, post wt 69kg, coming off 0.5- 1.5 kg over - good attendance, rarely stays on past 3.5 hrs - hectorol 3 mcg iv three times per week - parsabiv 7.5mg       CXR 2/17 - IMPRESSION: vascular congestion and probable mild interstitial edema. Small left effusion. 2. Chronic pleural and parenchymal scarring on the right. Similar peripheral bandlike opacities in the left mid to lower lung which could be due to chronic scarring or atelectasis.    Assessment/ Plan: Acute on chronic hypoxic resp failure - had HD 2/18 w/ 2.3 L UF, and shortness of breath improved at that time.  Later intubated after cardiac  arrest on 2/20.   Cardiac arrest / fib and VT - occurred 2/20 while on dialysis. Seen by cardiology.  Per critical care Shock - per charting presumed septic due to aspiration PNA; post arrest, on pressors per critical care.  ESRD  On CRRT.  4K fluids. Goal zero UF x 6 hours then back to keep even as tolerated  Usually on HD TTS. Had 2 hrs HD prior to code 2/20. Worsening shock prompted CRRT which was started on 2/21.  Note the concern that patient's heart may not be strong enough to tolerate regular dialysis.  Appreciate palliative care  Secondary hyperparathyroidism - hectorol held earlier for elevated corrected calcium. Now off binders on CRRT Anemia of esrd - Hb acceptable; no current indication for ESA End-stage bivent CHF - EF 20% then later was up 35%. No options per cardiology 06/2022  COPD - note decreased respiratory reserve; usually on home O2 2.5- 3 L [place] per charting H/o CVA - noted   Disposition - in ICU on CRRT with palliative care following as above   Recent Labs  Lab 03/04/23 0327 03/04/23 1639 03/05/23 0324 03/05/23 1515 03/06/23 0500  HGB 10.8*  --   --   --  11.4*  ALBUMIN 2.0*   < >  2.1* 2.2*  --   CALCIUM 8.7*   < > 8.6* 8.8*  --   PHOS 2.6   < > 2.3* 3.1  --   CREATININE 1.96*   < > 1.89* 1.74*  --   K 4.5   < > 4.4 4.3  --    < > = values in this interval not displayed.    Inpatient medications:  apixaban  5 mg Per Tube BID   Chlorhexidine Gluconate Cloth  6 each Topical Daily   docusate  100 mg Per Tube BID   feeding supplement (PROSource TF20)  60 mL Per Tube TID   fiber supplement (BANATROL TF)  60 mL Per Tube BID   hydrocortisone sod succinate (SOLU-CORTEF) inj  100 mg Intravenous Q12H   insulin aspart  0-6 Units Subcutaneous Q4H   mexiletine  200 mg Per Tube Q8H   midodrine  5 mg Per Tube 3 times per day on Sunday Monday Wednesday Friday   mouth rinse  15 mL Mouth Rinse Q2H   pantoprazole (PROTONIX) IV  40 mg Intravenous Q24H   polyethylene glycol   17 g Per Tube Daily    sodium chloride Stopped (03/06/23 0302)   feeding supplement (OSMOLITE 1.5 CAL) 50 mL/hr at 03/06/23 0500   heparin sodium (porcine)     norepinephrine (LEVOPHED) Adult infusion 14 mcg/min (03/06/23 0500)   piperacillin-tazobactam 3.375 g (03/06/23 0523)   prismasol BGK 4/2.5 400 mL/hr at 03/05/23 1802   prismasol BGK 4/2.5 400 mL/hr at 03/05/23 1801   prismasol BGK 4/2.5 1,400 mL/hr at 03/06/23 0300   vasopressin 0.03 Units/min (03/06/23 0524)   sodium chloride, acetaminophen (TYLENOL) oral liquid 160 mg/5 mL, fentaNYL (SUBLIMAZE) injection, fentaNYL (SUBLIMAZE) injection, heparin, heparin sodium (porcine), HYDROmorphone (DILAUDID) injection, ipratropium-albuterol, midazolam, mouth rinse, prochlorperazine     Estanislado Emms, MD 5:51 AM 03/06/2023

## 2023-03-06 NOTE — Progress Notes (Addendum)
 Daily Progress Note   Patient Name: Nathaniel White       Date: 03/06/2023 DOB: December 14, 1960  Age: 63 y.o. MRN#: [id number] Attending Physician: Steffanie Dunn, DO Primary Care Physician: Claiborne Rigg, NP Admit Date: 02/25/2023 Length of Stay: 8 days  Reason for Consultation/Follow-up: Establishing goals of care  HPI/Patient Profile:  63 y.o. male with past medical history of ESRD on HD TTS, COPD, chronic hypoxemic respiratory failure on 2.5 L home oxygen, chronic HFrEF (EF 35-40% 12/2022), severe mitral regurgitation, paroxysmal A-fib on Eliquis, chronic hypotension on midodrine, chronic anemia, PVD, CVA/TIA was admitted on 02/25/2023 with acute on chronic hypoxemic respiratory failure secondary to volume overload/pulmonary edema in setting of ESRD on HD, acute on chronic HFrEF and known severe mitral regurgitation, hypocalcemia, metabolic acidosis, elevated troponin, QT prolongation, end stage NICM (EF 30-35%) with chronic hypotension. His hospital course was complicated by code blue event on 2/21 - during HD patient went into VF arrest while receiving HD. CPR x4 minutes with ACLS medications administered. ROSC was achieved; however, on two additional occasions, patient rhythm deteriorated into monomorphic VT and he was shocked x 2. Decision was made to intubate.   Subjective:   Subjective: Chart Reviewed. Updates received. Patient Assessed. Created space and opportunity for patient  and family to explore thoughts and feelings regarding current medical situation.  Today's Discussion: Today saw the patient at bedside, multiple family members were present including mother Nathaniel White, cousin, and others.  The patient was intubated, sedated, unable to meaningfully interact, random upper extremity movements.  I received an update from Nathaniel Simmonds, NP with PCCM.  He is spoken with the family and they state that his sister/HCPOA would be arriving from [place] on Saturday.  In the meantime they would like to  remain DNR, continue current scope of care but no escalation.  When his sister/HCPOA Nathaniel White arrives on Saturday they will likely proceed with extubation.  They are hopeful that he will be able to maintain himself, but it was made clear that he is very very high risk to pass and family was recommended to prepare for the fact that he may pass.  They seem to be okay with this.  We discussed that if he does not do well especially if he begins to suffer then we would likely transition to comfort care and they seem to be in agreement.  I shared the palliative medicine is available for any questions or concerns.  They have requested on Saturday when family needs again to consider the extubation that a chaplain be present.  I shared that my colleague Nathaniel Cooter, NP would be here this weekend and I would pass this along to her.  I provided emotional and general support through therapeutic listening, empathy, sharing of stories, therapeutic touch, and other techniques. I answered all questions and addressed all concerns to the best of my ability.  Review of Systems  Unable to perform ROS: Intubated    Objective:   Vital Signs:  BP (!) 173/86   Pulse 96   Temp (!) 97 F (36.1 C) (Axillary)   Resp 18   Ht 5\' 7"  (1.702 m)   Wt 66.7 kg   SpO2 100%   BMI 23.03 kg/m   Physical Exam Vitals and nursing note reviewed.  Constitutional:      General: He is not in acute distress.    Appearance: He is ill-appearing.     Interventions: He is sedated and intubated.  HENT:  Head: Normocephalic and atraumatic.  Cardiovascular:     Rate and Rhythm: Normal rate.  Pulmonary:     Effort: Pulmonary effort is normal. No respiratory distress. He is intubated.  Abdominal:     General: Abdomen is flat.  Skin:    General: Skin is warm and dry.     Palliative Assessment/Data: 10%    Existing Vynca/ACP Documentation: Advance directive signed 06/26/2022  Assessment & Plan:   Impression: Present on  Admission:  Fluid overload  Acute on chronic hypoxic respiratory failure (HCC)  Paroxysmal atrial fibrillation (HCC)  Volume overload  SUMMARY OF RECOMMENDATIONS   Continue current scope of care to keep stable for family/friends to visit No escalation of care Anticipate likely extubation on Saturday If the patient survives neck step would be coming off CRRT Family is aware that he is very high risk to not do well Would plan to transition to comfort after extubation if he does not do well and/or if he begins to suffer Palliative medicine will follow-up on 03/08/2023 with my colleague Nathaniel Cooter, NP  Symptom Management:  Per primary team PMT is available to assist with transition to comfort care  Code Status: DNR-interventions desired (currently intubated)  Prognosis: Hours - Days  Discharge Planning: Anticipated Hospital Death  Discussed with: Medical team, nursing team, patient's family  Thank you for allowing [place] to participate in the care of [place] PMT will continue to support holistically.  Time Total: 43 min  Detailed review of medical records (labs, imaging, vital signs), medically appropriate exam, discussed with treatment team, counseling and education to patient, family, & staff, documenting clinical information, medication management, coordination of care  Wynne Dust, NP Palliative Medicine Team  Team Phone # [phone] (Nights/Weekends)  09/06/2020, 8:17 AM

## 2023-03-06 NOTE — IPAL (Signed)
  Interdisciplinary Goals of Care Family Meeting   Date carried out:: 03/06/2023  Location of the meeting: Bedside  Member's involved: Nurse Practitioner, Bedside Registered Nurse, and Family Member or next of kin  Durable Power of Attorney or acting medical decision maker: Nathaniel White (mother)     Discussion: We discussed goals of care for Nathaniel White .  We specifically spoke about his cardiac decline, his poor activity tolerance and limited physical reserves. Shared with him how poorly he tolerated weaning, our inability to wean pressors and my belief that he simply cannot tolerate life off a vent OR the stress on iHD.   Code status: Full DNR  Disposition: Continue current acute care  But no escalation. Plan to remove vent when sister arrives this weekend and then in next 24 hrs stop the CRRT. They understand he is unlikely to survive and we will need to transition to comfort   Time spent for the meeting: 22   Nathaniel White 03/06/2023, 1:16 PM

## 2023-03-06 NOTE — Progress Notes (Addendum)
 NAME:  Nathaniel White, MRN:  [id number], DOB:  [birth year], LOS: 8 ADMISSION DATE:  02/25/2023 CONSULTATION DATE:  02/28/2023 REFERRING MD:  Jerral Ralph - TRH,  CHIEF COMPLAINT:  Cardiac arrest   History of Present Illness:  63 year old man who [facility] for SOB after truncated HD session (last 2/15). PMHx significant for HTN, HLD, CVA (with L-sided deficits), PVD, PAF (on Eliquis), HFrEF with NICM, severe MR, COPD (on 2-3L HOT), T2DM, ESRD on HD TTS. Recent admission 1/27-1/29 for volume overload/pulmonary edema.   Patient was reportedly slated for discharge 2/19 but remained in-house due to weather conditions and concern he would not be able to to have transport to HD 2/20; HD was completed overnight 2/19-2/20. While in HD 2/20AM, Code Blue was called for VF arrest. CPR x 4 minutes with Epi x 1, amio 150mg  and 300mg , lidocaine x 1, Mg/Ca repletion. ROSC was achieved and patient was awake and alert, repeatedly saying "help me" and "I can't breathe". NRB was attempted with ongoing extremis. On two additional occasions, patient rhythm deteriorated into monomorphic VT and he was shocked x 2. Decision was made to intubate.  PCCM consulted for ICU transfer and management.  Pertinent Medical History:   Past Medical History:  Diagnosis Date   Anemia    Anxiety    Asthma    Complication from renal dialysis device 11/02/2013   Complication of anesthesia 2017   according to pt and spouse pt was moving around and cough while under and pt had difficulty waking up so the anesthesia had to be reversed. and pt admitted to ICU.    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/2008   Qualifier: Diagnosis of  By: Russella Dar MD Marylu Lund    ESRD    on HD, T-TH-Sat - [place]    Hemiparesis due to old stroke [facility])    left   HFrEF (heart failure with reduced ejection fraction) (HCC)    NICM // TTE 06/2022: EF 25-30, sever RV dysfunction, mod to severe MR   Hypertension    Hx, not current problems, no meds   LV dysfunction    EF 25-30% by echo 07/2011   Memory loss due to medical condition    due to stroke   MR (mitral regurgitation)    moderate to severe, echo 07/2011   Nausea with vomiting, unspecified 06/15/2021   Paroxysmal atrial fibrillation (HCC)    Peripheral vascular disease (HCC)    Shortness of breath    with exertion   Stroke [facility])    TIA's-left sided weakness   Tobacco abuse     Significant Hospital Events: Including procedures, antibiotic start and stop dates in addition to other pertinent events   2/20 - Cardiac arrest in HD, initial rhythm VF with CPR x 2 rounds, Epi/Mg/Ca, shocked x 2; after ROSC, monomorphic VT requiring shock x 2. Intubated at bedside in dialysis. Transferred to [facility] CVICU. Cardiology consulted. NE weaned off during day  2/24 failed vent wean, remains on pressors not able to wean. CRRT keeping even. Family meeting leaning towards comfort care, just need to arrange time when family can all come.  2/26 awake. Did about a hr on SBT. Mechanics and gas exchange OK but developed hemodynamic changes (SBP 40s-60s)  got a little more tachycardic   Interim History / Subjective:  Alert and able to answer yes/no questions on vent. Tolerating SBT. Gas exchange OK, SBI actually acceptable but tachycardic and SBP dropped so aborted  Objective:  Blood pressure (!) 79/22, pulse 89, temperature (!) 97.1 F (36.2 C), temperature source Axillary, resp. rate (!) 23, height 5\' 7"  (1.702 m), weight 66.7 kg, SpO2 99%. CVP:  [5 mmHg-10 mmHg] 5 mmHg  Vent Mode: PRVC FiO2 (%):  [30 %-40 %] 30 % Set Rate:  [18 bmp] 18 bmp Vt Set:  [530 mL] 530 mL PEEP:  [5 cmH20] 5 cmH20 Plateau Pressure:  [24 cmH20-28 cmH20] 28 cmH20   Intake/Output Summary (Last 24  hours) at 03/06/2023 0839 Last data filed at 03/06/2023 0800 Gross per 24 hour  Intake 2314.24 ml  Output 2095 ml  Net 219.24 ml   Filed Weights   03/04/23 0500 03/05/23 0524 03/06/23 0655  Weight: 70 kg 66.2 kg 66.7 kg   Physical Examination:  General: ill appearing. Deconditioned, no distress on SBT HEENT: ETT and OG present.  Pulmonary: mild rhonchi bilaterally, comfortable on SBT SBi 50s. Last CXR on 22nd. Ett good position. Bilateral airspace disease  Cardiac: RRR Abdomen: soft, nontender Extremities: no edema, dry cracked and scaly + LE edema  Neuro: alert, able to answer yes/no questions. Not sedated   Resolved Hospital Problem List:  Cardiogenic shock  VT/VF in-hospital cardiac arrest 2/20 Post cardiac arrest lactic acidosis Hypervolemic hyponatremia Constipation  Assessment & Plan:   End-stage NICM (EF 30-35%) w/ chronic hypotension; intolerant to GDMT due to hypotension and renal failure Paroxysmal Afib and prolonged Qtc s/p in-hospital VT cardiac arrest 2/20 (TIME TO rosc 4 MIN) Prolonged Qtc on EKG 2/22.  Plan Cont mexiletine Avoid amiodarone given Qtc concern  avoid Qtc prolonging meds Continue midodrine, will change to scheduled at 10mg /tid   eliquis BID GDMT limited by shock Not a candidate for mechanical support   Presumed septic shock w/ refractory vasoplegia. Initially due to aspiration pneumonia. Now think a mix of renal related vasoplegia and also consider cardiogenic component  WBC nml, still requiring multiple pressors. NOF from sputum Plan continue zosyn through 2/27 (day 6/7) NE and Vasopressin for MAP 65 Stress steroids, he is day 6, does not seem as though has helped much. Will initiate taper starting day 7  Keep euvolemic  Acute hypoxemic respiratory failure following VF arrest w/ bilateral airspace disease most likely reflecting pulmonary edema  Likely aspiration pneumonia from cardiac arrest Seemed to be tolerating SBT this AM but then  developed hemodynamic change about 1 hr. I am concerned that he will not tolerate extubation and that the vent has become in essence LV support.  Plan Full vent support  VAP prevention protocol daily SAT & SBT ABX as above keeping even with CRRT Am cxr Need to discuss GOC w/ family. I think we should strongly consider transition to comfort.   ESRD on HD TTS; PCKD I doubt he will tolerate iHD Plan CRRT via [facility] per nephrology strict I/O  T2DM w/ hyperglycemia Controlled on current insulin regimen plan SSI PRN goal BG 140-180  Moderate protein calorie malnutrition TF currently at goal Plan Cont tube feeds  Anemia w/out evidence of active bleeding -transfuse for Hb< 7 or hemodynamically significant bleeding; no current indications  Best Practice: (right click and "Reselect all SmartList Selections" daily)   Diet/type: tubefeeds DVT prophylaxis: DOAC GI prophylaxis: PPI Lines: Central line, Dialysis Catheter, and Arterial Line Foley:  N/A Code Status:  full code Last date of multidisciplinary goals of care discussion [2/22 with palliative care]  Critical care time:  32 minutes.        Attending attestation: Mr. Zieger is a 63 year old gentleman with a history of HFrEF due to nonischemic cardiomyopathy, end-stage renal disease on dialysis, chronic hypotension on midodrine, paroxysmal atrial fibrillation who presented to the hospital with dialysis access issues.  He unfortunately suffered a ventricular fibrillation cardiac arrest during dialysis, was intubated and moved to the ICU after ROSC.  He has failed to tolerate intermittent hemodialysis due to pressor requirements and has been unable to wean from mechanical ventilation since transferred to the intensive care unit. After about 1 hr failed SBT due to hemodynamic instability.  BP (!) 168/24 (BP Location: Right Leg)   Pulse 85   Temp 98.1 F (36.7 C) (Axillary)   Resp 18   Ht 5\' 7"  (1.702 m)   Wt 66.7 kg   SpO2  100%   BMI 23.03 kg/m  Chronically ill-appearing man lying in bed no acute distress RASS -1, opens eyes to verbal stimulation.  Globally weak. [place]/AT, endotracheal tube in place, cataracts Synchronous with the vent, no rhonchi or rales. S1-S2, regular rate, irregular rhythm Abdomen soft, nontender Minimal muscle mass  Labs reviewed.  Phos 2.4, improved to 2.9 CBC notable for hemoglobin 11.4, platelets 141 ABG 7.4/42/67/25  No updated cultures.  Assessment and plan: Acute on chronic HFrEF due to nonischemic cardiomyopathy Ventricular fibrillation cardiac arrest Paroxysmal atrial fibrillation Prolonged QTC -Continue vasopressors as required to maintain systolic blood pressure greater than 100 -Continue DOAC - Continue mexiletine to suppress frequent PVCs.  Lidocaine not an ideal choice with renal failure, amiodarone not an ideal choice due to prolonged Qtc  Septic shock due to aspiration pneumonia - Stress dose steroids - Continue antibiotics, cultures never finalized to specific organism.  Complete 7 days of appropriate therapy with Zosyn.  Last day 2/27. -Norepinephrine and vasopressin as required to maintain systolic blood pressure greater than 100 -Midodrine -Unfortunately we have made very little progress on vasopressor weaning  ESRD - CRRT per nephrology - Appears that he is not really tolerating intermittent hemodialysis at this point and this is likely an end-stage process - Renally dose meds and avoid nephrotoxic meds  Hypophosphatemia, repleted - Monitor  Thrombocytopenia, likely due to sepsis or antibiotics -monitor  Chronic anemia due to critical illness, end-stage renal disease - Transfuse for hemoglobin less than 7 or hemodynamically significant bleeding  Severe protein energy malnutrition - Tube feeds  Diarrhea - Banatrol  Ongoing GOC discussions. Planning for terminal extubation on 3/1 with subsequent discontinuation of additional life support  measures.  See IPAL note from today by NP Babcock.  This patient is critically ill with multiple organ system failure which requires frequent high complexity decision making, assessment, support, evaluation, and titration of therapies. This was completed through the application of advanced monitoring technologies and extensive interpretation of multiple databases. During this encounter critical care time was devoted to patient care services described in this note for 33 minutes.  Steffanie Dunn, DO 03/06/23 8:25 PM [facility]  For contact information, see Amion. If no response to pager, please call PCCM consult pager. After hours, 7PM- 7AM, please call Elink.

## 2023-03-07 ENCOUNTER — Inpatient Hospital Stay (HOSPITAL_COMMUNITY): Payer: Medicare Other

## 2023-03-07 DIAGNOSIS — N186 End stage renal disease: Secondary | ICD-10-CM | POA: Diagnosis not present

## 2023-03-07 DIAGNOSIS — I428 Other cardiomyopathies: Secondary | ICD-10-CM | POA: Diagnosis not present

## 2023-03-07 DIAGNOSIS — J9601 Acute respiratory failure with hypoxia: Secondary | ICD-10-CM | POA: Diagnosis not present

## 2023-03-07 DIAGNOSIS — J690 Pneumonitis due to inhalation of food and vomit: Secondary | ICD-10-CM | POA: Diagnosis not present

## 2023-03-07 LAB — CBC
HCT: 41.6 % (ref 39.0–52.0)
Hemoglobin: 12.1 g/dL — ABNORMAL LOW (ref 13.0–17.0)
MCH: 25.1 pg — ABNORMAL LOW (ref 26.0–34.0)
MCHC: 29.1 g/dL — ABNORMAL LOW (ref 30.0–36.0)
MCV: 86.1 fL (ref 80.0–100.0)
Platelets: 166 10*3/uL (ref 150–400)
RBC: 4.83 MIL/uL (ref 4.22–5.81)
RDW: 18.4 % — ABNORMAL HIGH (ref 11.5–15.5)
WBC: 9.9 10*3/uL (ref 4.0–10.5)
nRBC: 0.4 % — ABNORMAL HIGH (ref 0.0–0.2)

## 2023-03-07 LAB — RENAL FUNCTION PANEL
Albumin: 2.2 g/dL — ABNORMAL LOW (ref 3.5–5.0)
Albumin: 2.2 g/dL — ABNORMAL LOW (ref 3.5–5.0)
Anion gap: 14 (ref 5–15)
Anion gap: 10 (ref 5–15)
BUN: 27 mg/dL — ABNORMAL HIGH (ref 6–20)
BUN: 30 mg/dL — ABNORMAL HIGH (ref 6–20)
CO2: 24 mmol/L (ref 22–32)
CO2: 25 mmol/L (ref 22–32)
Calcium: 9.3 mg/dL (ref 8.9–10.3)
Calcium: 9 mg/dL (ref 8.9–10.3)
Chloride: 99 mmol/L (ref 98–111)
Chloride: 102 mmol/L (ref 98–111)
Creatinine, Ser: 1.53 mg/dL — ABNORMAL HIGH (ref 0.61–1.24)
Creatinine, Ser: 1.61 mg/dL — ABNORMAL HIGH (ref 0.61–1.24)
GFR, Estimated: 52 mL/min — ABNORMAL LOW (ref >60)
GFR, Estimated: 49 mL/min — ABNORMAL LOW
Glucose, Bld: 160 mg/dL — ABNORMAL HIGH (ref 70–99)
Glucose, Bld: 164 mg/dL — ABNORMAL HIGH (ref 70–99)
Phosphorus: 2.6 mg/dL (ref 2.5–4.6)
Phosphorus: 2.4 mg/dL — ABNORMAL LOW (ref 2.5–4.6)
Potassium: 3.9 mmol/L (ref 3.5–5.1)
Potassium: 4.1 mmol/L (ref 3.5–5.1)
Sodium: 137 mmol/L (ref 135–145)
Sodium: 137 mmol/L (ref 135–145)

## 2023-03-07 LAB — GLUCOSE, CAPILLARY
Glucose-Capillary: 154 mg/dL — ABNORMAL HIGH (ref 70–99)
Glucose-Capillary: 154 mg/dL — ABNORMAL HIGH (ref 70–99)
Glucose-Capillary: 133 mg/dL — ABNORMAL HIGH (ref 70–99)
Glucose-Capillary: 138 mg/dL — ABNORMAL HIGH (ref 70–99)
Glucose-Capillary: 112 mg/dL — ABNORMAL HIGH (ref 70–99)
Glucose-Capillary: 132 mg/dL — ABNORMAL HIGH (ref 70–99)

## 2023-03-07 LAB — MAGNESIUM: Magnesium: 2.6 mg/dL — ABNORMAL HIGH (ref 1.7–2.4)

## 2023-03-07 MED ORDER — SODIUM CHLORIDE 0.9 % IV SOLN
15.0000 mmol | Freq: Once | INTRAVENOUS | Status: AC
  Administered 2023-03-07: 15 mmol via INTRAVENOUS
  Filled 2023-03-07: qty 5

## 2023-03-07 MED ORDER — MIDODRINE HCL 5 MG PO TABS
15.0000 mg | ORAL_TABLET | Freq: Three times a day (TID) | ORAL | Status: DC
  Administered 2023-03-07 – 2023-03-09 (×6): 15 mg
  Filled 2023-03-07 (×6): qty 3

## 2023-03-07 MED ORDER — HYDROCORTISONE SOD SUC (PF) 100 MG IJ SOLR
100.0000 mg | Freq: Every day | INTRAMUSCULAR | Status: DC
  Administered 2023-03-08: 100 mg via INTRAVENOUS
  Filled 2023-03-07: qty 2

## 2023-03-07 NOTE — Progress Notes (Signed)
 NAME:  Nathaniel White, MRN:  [id number], DOB:  12-Aug-1960, LOS: 9 ADMISSION DATE:  02/25/2023 CONSULTATION DATE:  02/28/2023 REFERRING MD:  Jerral Ralph - TRH,  CHIEF COMPLAINT:  Cardiac arrest   History of Present Illness:  63 year old man who presented to [facility] 2/17 for SOB after truncated HD session (last 2/15). PMHx significant for HTN, HLD, CVA (with L-sided deficits), PVD, PAF (on Eliquis), HFrEF with NICM, severe MR, COPD (on 2-3L HOT), T2DM, ESRD on HD TTS. Recent admission 1/27-1/29 for volume overload/pulmonary edema.   Patient was reportedly slated for discharge 2/19 but remained in-house due to weather conditions and concern he would not be able to to have transport to HD 2/20; HD was completed overnight 2/19-2/20. While in HD 2/20AM, Code Blue was called for VF arrest. CPR x 4 minutes with Epi x 1, amio 150mg  and 300mg , lidocaine x 1, Mg/Ca repletion. ROSC was achieved and patient was awake and alert, repeatedly saying "help me" and "I can't breathe". NRB was attempted with ongoing extremis. On two additional occasions, patient rhythm deteriorated into monomorphic VT and he was shocked x 2. Decision was made to intubate.  PCCM consulted for ICU transfer and management.  Pertinent Medical History:   Past Medical History:  Diagnosis Date   Anemia    Anxiety    Asthma    Complication from renal dialysis device 11/02/2013   Complication of anesthesia 2017   according to pt and spouse pt was moving around and cough while under and pt had difficulty waking up so the anesthesia had to be reversed. and pt admitted to ICU.    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/2008   Qualifier: Diagnosis of  By: Russella Dar MD Marylu Lund    ESRD    on HD, T-TH-Sat - [place]    Hemiparesis due to old stroke [facility])    left   HFrEF (heart failure with reduced ejection fraction) (HCC)    NICM // TTE 06/2022: EF 25-30, sever RV dysfunction, mod to severe MR   Hypertension    Hx, not current problems, no meds   LV dysfunction    EF 25-30% by echo 07/2011   Memory loss due to medical condition    due to stroke   MR (mitral regurgitation)    moderate to severe, echo 07/2011   Nausea with vomiting, unspecified 06/15/2021   Paroxysmal atrial fibrillation (HCC)    Peripheral vascular disease (HCC)    Shortness of breath    with exertion   Stroke [facility])    TIA's-left sided weakness   Tobacco abuse     Significant Hospital Events: Including procedures, antibiotic start and stop dates in addition to other pertinent events   2/20 - Cardiac arrest in HD, initial rhythm VF with CPR x 2 rounds, Epi/Mg/Ca, shocked x 2; after ROSC, monomorphic VT requiring shock x 2. Intubated at bedside in dialysis. Transferred to [facility] CVICU. Cardiology consulted. NE weaned off during day  2/24 failed vent wean, remains on pressors not able to wean. CRRT keeping even. Family meeting leaning towards comfort care, just need to arrange time when family can all come.  2/26 awake. Did about a hr on SBT. Mechanics and gas exchange OK but developed hemodynamic changes (SBP 40s-60s)  got a little more tachycardic  2/27 family meeting the day prior DNR status reconfirmed.  Plan to extubate over the weekend when his sister arrived from [place] following that we will discontinue CRRT  Interim History / Subjective:  Really no significant change from yesterday Objective:  Blood pressure (!) 144/73, pulse 85, temperature (!) 97.4 F (36.3 C), temperature source Axillary, resp. rate (!) 22, height 5\' 7"  (1.702 m), weight 65.6 kg, SpO2 100%. CVP:  [5 mmHg-73 mmHg] 10 mmHg  Vent Mode: CPAP;PSV FiO2 (%):  [30 %] 30 % Set Rate:  [18 bmp] 18 bmp Vt Set:  [530 mL] 530 mL PEEP:  [5 cmH20] 5 cmH20 Pressure Support:   [15 cmH20] 15 cmH20 Plateau Pressure:  [27 cmH20-28 cmH20] 28 cmH20   Intake/Output Summary (Last 24 hours) at 03/07/2023 0802 Last data filed at 03/07/2023 0701 Gross per 24 hour  Intake 2446.17 ml  Output 2505.3 ml  Net -59.13 ml   Filed Weights   03/05/23 0524 03/06/23 0655 03/07/23 0500  Weight: 66.2 kg 66.7 kg 65.6 kg   Physical Examination:  General this is a debilitated 63 year old male patient he is currently lying in bed and in no acute distress this morning HEENT normal cephalic atraumatic orally intubated Pulmonary: Coarse, some scattered rhonchi.  No current accessory use on minimal vent settings Portable chest x-ray personally reviewed by myself shows was the endotracheal tube is in satisfactory position.  His HD catheter is unremarkable he has cardiomegaly improved aeration bilaterally compared to prior film on the 22nd Cardiac regular rate and rhythm Abdomen soft, is having liquid stool has Flexi-Seal in place GU anuric Neuro opens eyes, nods, tries to interact, left-sided hemiparesis stable   Resolved Hospital Problem List:  Cardiogenic shock  VT/VF in-hospital cardiac arrest 2/20 Post cardiac arrest lactic acidosis Hypervolemic hyponatremia Constipation  Assessment & Plan:   End-stage NICM (EF 30-35%) w/ chronic hypotension; intolerant to GDMT due to hypotension and renal failure Paroxysmal Afib and prolonged Qtc s/p in-hospital VT cardiac arrest 2/20 (TIME TO rosc 4 MIN) Prolonged Qtc on EKG 2/22.  Plan Cont mexiletine Avoid amiodarone given Qtc concern  avoid Qtc prolonging meds Continue midodrine, will change to scheduled at 15 mg 3 times daily eliquis BID GDMT limited by shock Not a candidate for mechanical support   Presumed septic shock w/ refractory vasoplegia. Initially due to aspiration pneumonia. Now think a mix of renal related vasoplegia and also consider cardiogenic component  WBC nml, still requiring multiple pressors. NOF from  sputum Plan Complete Zosyn today, this is 7 days of therapy Continuing NE and Vasopressin for systolic blood pressure goal of greater than 100 Today is stress dose steroids day #7, I have not seen any improvement with this, we will begin to taper to off Keep euvolemic   Acute hypoxemic respiratory failure following VF arrest w/ bilateral airspace disease most likely reflecting pulmonary edema  Likely aspiration pneumonia from cardiac arrest Seemed to be tolerating SBT this AM but then developed hemodynamic change about 1 hr. I am concerned that he will not tolerate extubation and that the vent has become in essence LV support.  Plan Continue full vent support Okay to cycle on pressure as tolerated today.  No plan for extubation until his sister arrives from [place] Continue VAP bundle PAD protocol RASS goal 0 to -1   ESRD on HD TTS; PCKD I doubt he will tolerate iHD Plan Will continue CRRT through extubation, with plan to discontinue CRRT postextubation Can assess  for IHD following CRRT discontinuation however family understands he is unlikely to be at a point to tolerate this  T2DM w/ hyperglycemia Controlled on current insulin regimen plan SSI q 4 goal BG 140-180  Moderate protein calorie malnutrition TF currently at goal Plan Cont tube feeds  Anemia w/out evidence of active bleeding Plan transfuse for Hb< 7 or hemodynamically significant bleeding; no current indications  Best Practice: (right click and "Reselect all SmartList Selections" daily)   Diet/type: tubefeeds DVT prophylaxis: DOAC GI prophylaxis: PPI Lines: Central line, Dialysis Catheter, and Arterial Line Foley:  N/A Code Status:  full code Last date of multidisciplinary goals of care discussion [2/22 with palliative care]  Critical care time: 35 minutes

## 2023-03-07 NOTE — Progress Notes (Signed)
 [facility] Progress Note   Subjective:  He has continued on CRRT.  He had 1.7 kg UF over 2/26 with CRRT.  He has been keep even as tolerated.  Per palliative care note continue current scope of care and note plans are for compassionate extubation in the next few days.  Spoke with bedside nurse.  Patient's CVP has been between 8-10.  He has been interactive with her and nodding yes or no.  She just gave him some fentanyl as the patient had just motioned toward the ET tube and motioned to mimic pulling it out.   Levophed is at 15 mcg/min and vasopressin is at 0.03 units/min.    Review of systems:   Unable to obtain due to intubated and sedated    Objective Vitals:   03/07/23 0505 03/07/23 0510 03/07/23 0515 03/07/23 0520  BP: (!) 88/57 (!) 78/29 (!) 83/42 (!) 82/32  Pulse:      Resp: 18 16 18 19   Temp:      TempSrc:      SpO2:      Weight:      Height:       Physical Exam      General adult male in bed critically ill  HEENT normocephalic atraumatic  Neck supple trachea midline Lungs coarse mechanical breath sounds, on vent Heart S1S2 no rub Abdomen soft nontender nondistended Extremities no edema lower extremities Neuro sedated does wake to exam but does not respond to questions for me (just got PRN) Access left internal jugular tunn dialysis catheter  OP HD: SW TTS  4h  B400   67.5kg   2K bath  TDC   Heparin none - last HD 2/15, post wt 69kg, coming off 0.5- 1.5 kg over - good attendance, rarely stays on past 3.5 hrs - hectorol 3 mcg iv three times per week - parsabiv 7.5mg       CXR 2/17 - IMPRESSION: vascular congestion and probable mild interstitial edema. Small left effusion. 2. Chronic pleural and parenchymal scarring on the right. Similar peripheral bandlike opacities in the left mid to lower lung which could be due to chronic scarring or atelectasis.    Assessment/ Plan: Acute on chronic hypoxic resp failure - had HD 2/18 w/ 2.3 L UF, and  shortness of breath improved at that time.  Later intubated after cardiac arrest on 2/20.   Cardiac arrest / fib and VT - occurred 2/20 while on dialysis. Seen by cardiology.  Per critical care Shock - per charting presumed septic due to aspiration PNA; post arrest, on pressors per critical care.  ESRD  On CRRT as aligns with goals of care.  Note plans to remove the vent.  Appreciate critical care and palliative care teams and defer to them for when to remove CRRT.  4K fluids. Goal keep even as tolerated Usually on HD TTS. Had 2 hrs HD prior to code 2/20. Worsening shock prompted CRRT which was started on 2/21.  Note the concern that patient's heart is not strong enough to tolerate intermittent hemodialysis.  Appreciate palliative care  Secondary hyperparathyroidism - hectorol held earlier for elevated corrected calcium. Now off binders on CRRT Anemia of esrd - Hb acceptable; no indication for ESA End-stage bivent CHF - EF 20% then later was up 35%. No options per cardiology 06/2022  COPD - note decreased respiratory reserve; usually on home O2 2.5- 3 L [place] per charting H/o CVA - noted   Disposition - in ICU on CRRT  with palliative care following as above   Recent Labs  Lab 03/06/23 0856 03/06/23 1725 03/07/23 0403  HGB 14.3  --  12.1*  ALBUMIN  --  2.3* 2.2*  CALCIUM  --  9.2 9.3  PHOS  --  2.9 2.6  CREATININE  --  1.54* 1.53*  K 4.5 4.2 3.9    Inpatient medications:  apixaban  5 mg Per Tube BID   Chlorhexidine Gluconate Cloth  6 each Topical Daily   docusate  100 mg Per Tube BID   feeding supplement (PROSource TF20)  60 mL Per Tube TID   fiber supplement (BANATROL TF)  60 mL Per Tube BID   hydrocortisone sod succinate (SOLU-CORTEF) inj  100 mg Intravenous Q12H   insulin aspart  0-6 Units Subcutaneous Q4H   mexiletine  200 mg Per Tube Q8H   midodrine  10 mg Per Tube Q8H   mouth rinse  15 mL Mouth Rinse Q2H   pantoprazole (PROTONIX) IV  40 mg Intravenous Q24H   polyethylene  glycol  17 g Per Tube Daily   sodium chloride flush  10-40 mL Intracatheter Q12H    feeding supplement (OSMOLITE 1.5 CAL) 50 mL/hr at 03/07/23 0525   heparin sodium (porcine)     norepinephrine (LEVOPHED) Adult infusion 14 mcg/min (03/07/23 0529)   piperacillin-tazobactam 3.375 g (03/07/23 0530)   prismasol BGK 4/2.5 400 mL/hr at 03/06/23 1922   prismasol BGK 4/2.5 400 mL/hr at 03/06/23 1922   prismasol BGK 4/2.5 1,400 mL/hr at 03/07/23 0355   vasopressin 0.03 Units/min (03/07/23 0525)   acetaminophen (TYLENOL) oral liquid 160 mg/5 mL, fentaNYL (SUBLIMAZE) injection, fentaNYL (SUBLIMAZE) injection, Gerhardt's butt cream, heparin, heparin sodium (porcine), HYDROmorphone (DILAUDID) injection, ipratropium-albuterol, midazolam, mouth rinse, prochlorperazine, sodium chloride flush     Nathaniel Emms, MD 5:55 AM 03/07/2023

## 2023-03-07 NOTE — Plan of Care (Signed)
  Problem: Education: Goal: Knowledge of General Education information will improve Description: Including pain rating scale, medication(s)/side effects and non-pharmacologic comfort measures Outcome: Not Progressing   Problem: Health Behavior/Discharge Planning: Goal: Ability to manage health-related needs will improve Outcome: Not Progressing   Problem: Clinical Measurements: Goal: Ability to maintain clinical measurements within normal limits will improve Outcome: Not Progressing Goal: Diagnostic test results will improve Outcome: Not Progressing Goal: Respiratory complications will improve Outcome: Not Progressing Goal: Cardiovascular complication will be avoided Outcome: Not Progressing   Problem: Activity: Goal: Risk for activity intolerance will decrease Outcome: Not Progressing

## 2023-03-08 DIAGNOSIS — J9601 Acute respiratory failure with hypoxia: Secondary | ICD-10-CM | POA: Diagnosis not present

## 2023-03-08 DIAGNOSIS — I5023 Acute on chronic systolic (congestive) heart failure: Secondary | ICD-10-CM | POA: Diagnosis not present

## 2023-03-08 LAB — MAGNESIUM: Magnesium: 2.5 mg/dL — ABNORMAL HIGH (ref 1.7–2.4)

## 2023-03-08 LAB — CBC
HCT: 42.8 % (ref 39.0–52.0)
Hemoglobin: 12.6 g/dL — ABNORMAL LOW (ref 13.0–17.0)
MCH: 25.1 pg — ABNORMAL LOW (ref 26.0–34.0)
MCHC: 29.4 g/dL — ABNORMAL LOW (ref 30.0–36.0)
MCV: 85.4 fL (ref 80.0–100.0)
Platelets: 177 10*3/uL (ref 150–400)
RBC: 5.01 MIL/uL (ref 4.22–5.81)
RDW: 18.6 % — ABNORMAL HIGH (ref 11.5–15.5)
WBC: 13.6 10*3/uL — ABNORMAL HIGH (ref 4.0–10.5)
nRBC: 1.1 % — ABNORMAL HIGH (ref 0.0–0.2)

## 2023-03-08 LAB — RENAL FUNCTION PANEL
Albumin: 2.2 g/dL — ABNORMAL LOW (ref 3.5–5.0)
Albumin: 2.3 g/dL — ABNORMAL LOW (ref 3.5–5.0)
Anion gap: 11 (ref 5–15)
Anion gap: 9 (ref 5–15)
BUN: 27 mg/dL — ABNORMAL HIGH (ref 6–20)
BUN: 28 mg/dL — ABNORMAL HIGH (ref 6–20)
CO2: 24 mmol/L (ref 22–32)
CO2: 25 mmol/L (ref 22–32)
Calcium: 8.9 mg/dL (ref 8.9–10.3)
Calcium: 9.1 mg/dL (ref 8.9–10.3)
Chloride: 102 mmol/L (ref 98–111)
Chloride: 102 mmol/L (ref 98–111)
Creatinine, Ser: 1.54 mg/dL — ABNORMAL HIGH (ref 0.61–1.24)
Creatinine, Ser: 1.54 mg/dL — ABNORMAL HIGH (ref 0.61–1.24)
GFR, Estimated: 51 mL/min — ABNORMAL LOW (ref >60)
GFR, Estimated: 51 mL/min — ABNORMAL LOW (ref >60)
Glucose, Bld: 148 mg/dL — ABNORMAL HIGH (ref 70–99)
Glucose, Bld: 152 mg/dL — ABNORMAL HIGH (ref 70–99)
Phosphorus: 3 mg/dL (ref 2.5–4.6)
Phosphorus: 2.1 mg/dL — ABNORMAL LOW (ref 2.5–4.6)
Potassium: 3.9 mmol/L (ref 3.5–5.1)
Potassium: 4.5 mmol/L (ref 3.5–5.1)
Sodium: 137 mmol/L (ref 135–145)
Sodium: 136 mmol/L (ref 135–145)

## 2023-03-08 LAB — GLUCOSE, CAPILLARY
Glucose-Capillary: 113 mg/dL — ABNORMAL HIGH (ref 70–99)
Glucose-Capillary: 115 mg/dL — ABNORMAL HIGH (ref 70–99)
Glucose-Capillary: 122 mg/dL — ABNORMAL HIGH (ref 70–99)
Glucose-Capillary: 123 mg/dL — ABNORMAL HIGH (ref 70–99)
Glucose-Capillary: 125 mg/dL — ABNORMAL HIGH (ref 70–99)
Glucose-Capillary: 138 mg/dL — ABNORMAL HIGH (ref 70–99)
Glucose-Capillary: 138 mg/dL — ABNORMAL HIGH (ref 70–99)

## 2023-03-08 MED ORDER — HYDROCORTISONE SOD SUC (PF) 100 MG IJ SOLR
50.0000 mg | Freq: Every day | INTRAMUSCULAR | Status: DC
  Administered 2023-03-09: 50 mg via INTRAVENOUS
  Filled 2023-03-08: qty 2

## 2023-03-08 NOTE — Progress Notes (Signed)
 Daily Progress Note   Patient Name: Nathaniel White       Date: 03/08/2023 DOB: 1960/05/20  Age: 63 y.o. MRN#: [id number] Attending Physician: Cheri Fowler, MD Primary Care Physician: Claiborne Rigg, NP Admit Date: 02/25/2023  Reason for Consultation/Follow-up: Establishing goals of care and Terminal Care  Subjective: I have reviewed medical records including EPIC notes, MAR, any available advanced directives as necessary, and labs. Received report from primary RN - no acute concerns.  Went to visit patient at bedside - no family/visitors present. Patient was lying in bed - he remains intubated and on pressors with CRRT and coretrak. No signs or non-verbal gestures of pain or discomfort noted. No respiratory distress, increased work of breathing, or secretions noted. He is ill and frail appearing. No significant changes from yesterday it appears.  Per previous discussions, plan is for extubation tomorrow after sister/[name]'s arrival with low threshold for transition to full comfort. Prognosis remains poor. Also per discussions, Marylene Land is traveling today and should be arriving in town late this evening.   Will plan to reach out to [name]/[facility] tomorrow morning to finalize time for extubation.  Length of Stay: 10  Current Medications: Scheduled Meds:   apixaban  5 mg Per Tube BID   Chlorhexidine Gluconate Cloth  6 each Topical Daily   docusate  100 mg Per Tube BID   feeding supplement (PROSource TF20)  60 mL Per Tube TID   fiber supplement (BANATROL TF)  60 mL Per Tube BID   [START ON 04/06/2023] hydrocortisone sod succinate (SOLU-CORTEF) inj  50 mg Intravenous Daily   insulin aspart  0-6 Units Subcutaneous Q4H   mexiletine  200 mg Per Tube Q8H   midodrine  15 mg Per Tube Q8H   mouth  rinse  15 mL Mouth Rinse Q2H   pantoprazole (PROTONIX) IV  40 mg Intravenous Q24H   polyethylene glycol  17 g Per Tube Daily   sodium chloride flush  10-40 mL Intracatheter Q12H    Continuous Infusions:  feeding supplement (OSMOLITE 1.5 CAL) 50 mL/hr at 03/08/23 1300   heparin sodium (porcine)     norepinephrine (LEVOPHED) Adult infusion 8 mcg/min (03/08/23 1300)   prismasol BGK 4/2.5 400 mL/hr at 03/08/23 1002   prismasol BGK 4/2.5 400 mL/hr at 03/08/23 1002   prismasol BGK 4/2.5 1,400 mL/hr  at 03/08/23 4098   vasopressin 0.03 Units/min (03/08/23 1300)    PRN Meds: acetaminophen (TYLENOL) oral liquid 160 mg/5 mL, fentaNYL (SUBLIMAZE) injection, fentaNYL (SUBLIMAZE) injection, Gerhardt's butt cream, heparin, heparin sodium (porcine), HYDROmorphone (DILAUDID) injection, ipratropium-albuterol, midazolam, mouth rinse, prochlorperazine, sodium chloride flush  Physical Exam Vitals and nursing note reviewed.  Constitutional:      General: He is not in acute distress.    Appearance: He is ill-appearing.     Interventions: He is intubated.  Pulmonary:     Effort: No respiratory distress. He is intubated.  Skin:    General: Skin is warm and dry.  Neurological:     Motor: Weakness present.             Vital Signs: BP (!) 168/27   Pulse 87   Temp 97.7 F (36.5 C) (Axillary)   Resp 19   Ht 5\' 7"  (1.702 m)   Wt 64.1 kg   SpO2 94%   BMI 22.13 kg/m  SpO2: SpO2: 94 % O2 Device: O2 Device: Ventilator O2 Flow Rate: O2 Flow Rate (L/min): 3 L/min  Intake/output summary:  Intake/Output Summary (Last 24 hours) at 03/08/2023 1311 Last data filed at 03/08/2023 1300 Gross per 24 hour  Intake 2560.88 ml  Output 2897.2 ml  Net -336.32 ml   LBM: Last BM Date : 03/08/23 Baseline Weight: Weight:  (In ER stretcher that is not zero'd out) Most recent weight: Weight: 64.1 kg       Palliative Assessment/Data:PPS 30% with coretrak      Patient Active Problem List   Diagnosis Date Noted    Acute on chronic HFrEF (heart failure with reduced ejection fraction) (HCC) 03/06/2023   Aspiration pneumonia due to gastric secretions (HCC) 03/04/2023   Septic shock (HCC) 03/04/2023   On mechanically assisted ventilation (HCC) 03/01/2023   Aspiration pneumonia of both lower lobes due to gastric secretions (HCC) 03/01/2023   Shock (HCC) 03/01/2023   Cardiac arrest (HCC) 02/28/2023   Fluid overload 02/26/2023   Dyspnea 02/05/2023   Acute exacerbation of CHF (congestive heart failure) (HCC) 01/15/2023   CAP (community acquired pneumonia) 01/15/2023   Hyperkalemia 01/15/2023   Chronic ITP (idiopathic thrombocytopenia) (HCC) 01/15/2023   Acute hypoxic respiratory failure (HCC) 01/15/2023   Respiratory failure (HCC) 01/15/2023   Hypokalemia 12/28/2022   Prolonged QT interval 12/28/2022   Chronic hypoxic respiratory failure (HCC) 12/28/2022   Chronic systolic CHF (congestive heart failure) (HCC) 12/28/2022   Elevated troponin 12/28/2022   Acute bronchitis due to Rhinovirus 12/28/2022   First degree heart block 09/17/2022   Puncture wound of left knee with complication 09/15/2022   Open wound of penis with complication 09/15/2022   Sepsis secondary to penile infection 09/15/2022   Diabetes mellitus without complication (HCC) 09/15/2022   Encounter for screening for respiratory tuberculosis 08/20/2022   Mitral regurgitation 07/03/2022   Ventricular tachycardia, unspecified (HCC) 06/20/2022   Palpitations 06/19/2022   Volume overload 09/14/2021   COPD with acute exacerbation (HCC)    Pulmonary embolus, right (HCC) 09/07/2021   Severe pulmonary hypertension (HCC) 09/07/2021   End-stage renal disease on hemodialysis (HCC) 09/07/2021   Diabetes mellitus type 2, controlled, without complications (HCC) 09/07/2021   Anemia of chronic disease 09/07/2021   SOB (shortness of breath) 09/04/2021   History of pulmonary embolism 09/04/2021   Pulmonary embolism (HCC) 09/04/2021   NSVT  (nonsustained ventricular tachycardia) (HCC) 06/26/2021   Hypoglycemia 06/24/2021   Cirrhosis (HCC) 06/23/2021   Confusion 06/22/2021   Necrotizing pneumonia (HCC)  06/21/2021   CVA (cerebral vascular accident) (HCC) 06/21/2021   Paroxysmal atrial fibrillation (HCC) 06/21/2021   Chronic hypotension 06/21/2021   Unusual Appearance with Innumerable Lesions on CT scan, needs MRI 06/21/2021   Aortic atherosclerosis (HCC) 06/21/2021   ESRD (end stage renal disease) (HCC) 06/20/2021   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 (HCC) 06/20/2021   Nausea with vomiting, unspecified 06/15/2021   Acute on chronic hypoxic respiratory failure (HCC) 03/10/2021   Shock circulatory (HCC) 03/10/2021   Hemodialysis-associated hypotension 03/10/2021   Finger infection    Paronychia of finger, left 02/03/2021   Left hand pain 12/29/2020   Allergy, unspecified, initial encounter 09/28/2019   Anaphylactic shock, unspecified, initial encounter 09/28/2019   Pressure ulcer of ankle 09/01/2018   Thrombocytopenia (HCC) 01/17/2015   Transfusion history 01/17/2015   Hypercalcemia 11/01/2014   Dependence on renal dialysis (HCC) 03/26/2014   Encounter for fitting and adjustment of extracorporeal dialysis catheter (HCC) 03/26/2014   Diarrhea, unspecified 01/03/2014   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/10/2013   Pain, unspecified 09/23/2013   Cough 09/07/2013   HFrEF (heart failure with reduced ejection fraction) (HCC) 08/14/2013   Coagulation defect, unspecified (HCC) 12/15/2012   Moderate protein-calorie malnutrition (HCC) 08/07/2012   Restrictive lung disease 07/06/2012   History of CVA (cerebrovascular accident) 08/13/2011   ESRD on dialysis (HCC) 03/13/2011   HTN (hypertension) 03/13/2011   H/O 03/13/2011   Anemia 03/13/2011   Fluid overload, unspecified 03/13/2011   History of nonadherence to medical treatment 03/13/2011   Non-insulin dependent type 2 diabetes mellitus  (HCC) 03/13/2011   Tobacco abuse 03/13/2011   Kidney transplant status 11/28/2008   HEMATOCHEZIA 10/04/2008   Iron deficiency anemia, unspecified 03/31/2003   Patient's noncompliance with other medical treatment and regimen 03/31/2003   Personal history of nicotine dependence 03/31/2003   Secondary hyperparathyroidism of renal origin (HCC) 03/31/2003    Palliative Care Assessment & Plan   Patient Profile: 63 y.o. male with past medical history of ESRD on HD TTS, COPD, chronic hypoxemic respiratory failure on 2.5 L home oxygen, chronic HFrEF (EF 35-40% 12/2022), severe mitral regurgitation, paroxysmal A-fib on Eliquis, chronic hypotension on midodrine, chronic anemia, PVD, CVA/TIA was admitted on 02/25/2023 with acute on chronic hypoxemic respiratory failure secondary to volume overload/pulmonary edema in setting of ESRD on HD, acute on chronic HFrEF and known severe mitral regurgitation, hypocalcemia, metabolic acidosis, elevated troponin, QT prolongation, end stage NICM (EF 30-35%) with chronic hypotension. His hospital course was complicated by code blue event on 2/21 - during HD patient went into VF arrest while receiving HD. CPR x4 minutes with ACLS medications administered. ROSC was achieved; however, on two additional occasions, patient rhythm deteriorated into monomorphic VT and he was shocked x 2. Decision was made to intubate.   Assessment: Principal Problem:   Fluid overload Active Problems:   ESRD on dialysis (HCC)   Acute on chronic hypoxic respiratory failure (HCC)   Paroxysmal atrial fibrillation (HCC)   Volume overload   Acute exacerbation of CHF (congestive heart failure) (HCC)   Cardiac arrest (HCC)   On mechanically assisted ventilation (HCC)   Aspiration pneumonia of both lower lobes due to gastric secretions (HCC)   Shock (HCC)   Aspiration pneumonia due to gastric secretions (HCC)   Septic shock (HCC)   Acute on chronic HFrEF (heart failure with reduced ejection  fraction) (HCC)   Concern about end of life  Recommendations/Plan: Continue current scope of care with goal to keep patient stable until all family  can arrive on Saturday 3/1 - no escalation of care Continue DNR as previously documented Plan is for extubation Saturday 3/1 with low threshold for transition to full comfort if he declines PMT will reach out to family Saturday morning to solidify a time extubation will take place PMT will continue to follow and support holistically  Goals of Care and Additional Recommendations: Limitations on Scope of Treatment: No Tracheostomy  Code Status:    Code Status Orders  (From admission, onward)           Start     Ordered   03/02/23 1428  Do not attempt resuscitation (DNR) Pre-Arrest Interventions Desired  (Code Status)  Continuous       Question Answer Comment  If pulseless and not breathing No CPR or chest compressions.   In Pre-Arrest Conditions (Patient Has Pulse and Is Breathing) May intubate, use advanced airway interventions and cardioversion/ACLS medications if appropriate or indicated. May transfer to ICU.   Consent: Discussion documented in EHR or advanced directives reviewed      03/02/23 1428           Code Status History     Date Active Date Inactive Code Status Order ID Comments User Context   02/26/2023 0636 03/02/2023 1428 Full Code [id number]  John Giovanni, MD ED   02/05/2023 0254 02/06/2023 1844 Full Code [id number]  Darlin Drop, DO ED   01/15/2023 0909 01/19/2023 2138 Full Code [id number]  Minor, Vilinda Blanks, NP ED   01/15/2023 0450 01/15/2023 0909 Full Code [id number]  Tereasa Coop, MD ED   12/27/2022 2358 01/01/2023 1946 Full Code [id number]  Howerter, Chaney Born, DO ED   11/29/2022 1031 11/30/2022 2212 Full Code [id number]  Lurline Del, MD ED   09/15/2022 1243 09/24/2022 2333 Full Code [id number]  Alfredo Martinez, MD ED   06/22/2022 1739 06/28/2022 2159 DNR [id number]  Ernie Avena, NP Inpatient   06/20/2022  0033 06/22/2022 1739 Full Code [id number]  Joellen Jersey., MD ED   09/13/2021 2350 09/17/2021 2224 Full Code [id number]  Howerter, Chaney Born, DO ED   09/04/2021 2104 09/08/2021 1824 Full Code [id number]  Eduard Clos, MD Inpatient   06/20/2021 0942 06/26/2021 2359 Full Code [id number]  Graceann Congress, PA-C Inpatient   07/21/2018 1138 07/21/2018 1714 Full Code [id number]  Maeola Harman, MD Inpatient   11/10/2013 1812 11/11/2013 2126 Full Code [id number]  Raymond Gurney, PA-C Inpatient   10/09/2013 1343 10/09/2013 2026 Full Code [id number]  Kathleene Hazel, MD Inpatient   08/10/2011 1702 08/19/2011 1428 Full Code [id number]  Alonna Buckler, RN Inpatient   03/13/2011 0647 03/14/2011 1849 Full Code [id number]  Eduard Clos, MD Inpatient      Advance Directive Documentation    Flowsheet Row Most Recent Value  Type of Advance Directive Living will, Healthcare Power of Attorney  Pre-existing out of facility DNR order (yellow form or pink MOST form) --  "MOST" Form in Place? --       Prognosis:  Poor  Discharge Planning: Anticipated Hospital Death  Care plan was discussed with primary RN  Thank you for allowing the Palliative Medicine Team to assist in the care of this patient.   Total Time 25 minutes Prolonged Time Billed  no       Haskel Khan, NP  Please contact Palliative Medicine Team phone at [phone] for questions and concerns.   *Portions of this note are a verbal dictation therefore any spelling and/or grammatical errors are  due to the [employer] One" system interpretation.

## 2023-03-08 NOTE — Plan of Care (Signed)
  Problem: Role Relationship: Goal: Method of communication will improve Outcome: Progressing   Problem: Metabolic: Goal: Ability to maintain appropriate glucose levels will improve Outcome: Progressing   Problem: Nutritional: Goal: Maintenance of adequate nutrition will improve Outcome: Progressing   Problem: Activity: Goal: Ability to tolerate increased activity will improve Outcome: Not Progressing   Problem: Respiratory: Goal: Ability to maintain a clear airway and adequate ventilation will improve Outcome: Not Progressing   Problem: Fluid Volume: Goal: Ability to maintain a balanced intake and output will improve Outcome: Not Progressing

## 2023-03-08 NOTE — Progress Notes (Signed)
 [facility] Progress Note   Subjective:  He has continued on CRRT.  He had 2.1 kg UF over 2/27 with CRRT.  He has been keep even as tolerated.  His family is planning on compassionate extubation and transition to comfort care this weekend.  He has been on levophed at 12 mcg/min and vasopressin at 0.03 units/min.  Per his nurse his last CVP as 8.  He has gotten PRN's overnight for comfort    Review of systems:    Unable to obtain due to intubated and sedated    Objective Vitals:   03/08/23 0445 03/08/23 0502 03/08/23 0515 03/08/23 0532  BP: (!) 136/38 (!) 87/70 (!) 69/35 (!) 80/31  Pulse: 81 82 81 79  Resp: 18 (!) 23 18 18   Temp:      TempSrc:      SpO2: 100% 98% 97% 96%  Weight:      Height:       Physical Exam       General adult male in bed critically ill  HEENT normocephalic atraumatic  Neck supple trachea midline Lungs coarse mechanical breath sounds, on vent Heart S1S2 no rub Abdomen soft nontender nondistended Extremities no edema lower extremities Neuro sedated does wake to exam and nods or shakes head to respond Access left internal jugular tunn dialysis catheter  OP HD: SW TTS  4h  B400   67.5kg   2K bath  TDC   Heparin none - last HD 2/15, post wt 69kg, coming off 0.5- 1.5 kg over - good attendance, rarely stays on past 3.5 hrs - hectorol 3 mcg iv three times per week - parsabiv 7.5mg       CXR 2/17 - IMPRESSION: vascular congestion and probable mild interstitial edema. Small left effusion. 2. Chronic pleural and parenchymal scarring on the right. Similar peripheral bandlike opacities in the left mid to lower lung which could be due to chronic scarring or atelectasis.    Assessment/ Plan: Acute on chronic hypoxic resp failure - had HD 2/18 w/ 2.3 L UF, and shortness of breath improved at that time.  Later intubated after cardiac arrest on 2/20.   Cardiac arrest / fib and VT - occurred 2/20 while on dialysis. Seen by cardiology.  Per  critical care Shock - per charting presumed septic due to aspiration PNA; post arrest, on pressors per critical care.  ESRD  On CRRT as aligns with goals of care.  Note plans for compassionate extubation this weekend.  Appreciate critical care and palliative care teams and defer to them for when to remove CRRT.  4K fluids. Goal keep even as tolerated   Usually on HD TTS. Had 2 hrs HD prior to code 2/20. Worsening shock prompted CRRT which was started on 2/21.  Note the concern that patient's heart is not strong enough to tolerate intermittent hemodialysis.  Appreciate palliative care  Secondary hyperparathyroidism - hectorol held earlier for elevated corrected calcium. Now off binders on CRRT Anemia of esrd - Hb acceptable; no indication for ESA End-stage bivent CHF - EF 20% then later was up 35%. No options per cardiology 06/2022  COPD - note decreased respiratory reserve; usually on home O2 2.5- 3 L [place] per charting H/o CVA - noted   Disposition - in ICU on CRRT with palliative care following as above   Recent Labs  Lab 03/07/23 0403 03/07/23 1713 03/08/23 0418  HGB 12.1*  --  12.6*  ALBUMIN 2.2* 2.2* 2.2*  CALCIUM 9.3 9.0 8.9  PHOS 2.6 2.4* 3.0  CREATININE 1.53* 1.61* 1.54*  K 3.9 4.1 3.9    Inpatient medications:  apixaban  5 mg Per Tube BID   Chlorhexidine Gluconate Cloth  6 each Topical Daily   docusate  100 mg Per Tube BID   feeding supplement (PROSource TF20)  60 mL Per Tube TID   fiber supplement (BANATROL TF)  60 mL Per Tube BID   hydrocortisone sod succinate (SOLU-CORTEF) inj  100 mg Intravenous Daily   insulin aspart  0-6 Units Subcutaneous Q4H   mexiletine  200 mg Per Tube Q8H   midodrine  15 mg Per Tube Q8H   mouth rinse  15 mL Mouth Rinse Q2H   pantoprazole (PROTONIX) IV  40 mg Intravenous Q24H   polyethylene glycol  17 g Per Tube Daily   sodium chloride flush  10-40 mL Intracatheter Q12H    feeding supplement (OSMOLITE 1.5 CAL) 50 mL/hr at 03/08/23 0500    heparin sodium (porcine)     norepinephrine (LEVOPHED) Adult infusion 10 mcg/min (03/08/23 0500)   prismasol BGK 4/2.5 400 mL/hr at 03/07/23 2122   prismasol BGK 4/2.5 400 mL/hr at 03/07/23 2122   prismasol BGK 4/2.5 1,400 mL/hr at 03/08/23 0208   vasopressin 0.03 Units/min (03/08/23 0500)   acetaminophen (TYLENOL) oral liquid 160 mg/5 mL, fentaNYL (SUBLIMAZE) injection, fentaNYL (SUBLIMAZE) injection, Gerhardt's butt cream, heparin, heparin sodium (porcine), HYDROmorphone (DILAUDID) injection, ipratropium-albuterol, midazolam, mouth rinse, prochlorperazine, sodium chloride flush     Estanislado Emms, MD 5:43 AM 03/08/2023

## 2023-03-08 NOTE — Progress Notes (Addendum)
 NAME:  Nathaniel White, MRN:  [id number], DOB:  11/21/1960, LOS: 10 ADMISSION DATE:  02/25/2023 CONSULTATION DATE:  02/28/2023 REFERRING MD:  Jerral Ralph - TRH,  CHIEF COMPLAINT:  Cardiac arrest   History of Present Illness:  63 year old man who presented to [facility] 2/17 for SOB after truncated HD session (last 2/15). PMHx significant for HTN, HLD, CVA (with L-sided deficits), PVD, PAF (on Eliquis), HFrEF with NICM, severe MR, COPD (on 2-3L HOT), T2DM, ESRD on HD TTS. Recent admission 1/27-1/29 for volume overload/pulmonary edema.   Patient was reportedly slated for discharge 2/19 but remained in-house due to weather conditions and concern he would not be able to to have transport to HD 2/20; HD was completed overnight 2/19-2/20. While in HD 2/20AM, Code Blue was called for VF arrest. CPR x 4 minutes with Epi x 1, amio 150mg  and 300mg , lidocaine x 1, Mg/Ca repletion. ROSC was achieved and patient was awake and alert, repeatedly saying "help me" and "I can't breathe". NRB was attempted with ongoing extremis. On two additional occasions, patient rhythm deteriorated into monomorphic VT and he was shocked x 2. Decision was made to intubate.  PCCM consulted for ICU transfer and management.  Pertinent Medical History:   Past Medical History:  Diagnosis Date   Anemia    Anxiety    Asthma    Complication from renal dialysis device 11/02/2013   Complication of anesthesia 2017   according to pt and spouse pt was moving around and cough while under and pt had difficulty waking up so the anesthesia had to be reversed. and pt admitted to ICU.    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/2008   Qualifier: Diagnosis of  By: Russella Dar MD Marylu Lund    ESRD    on HD, T-TH-Sat - [place]    Hemiparesis due to old stroke [facility])    left   HFrEF (heart failure with reduced ejection fraction) (HCC)    NICM // TTE 06/2022: EF 25-30, sever RV dysfunction, mod to severe MR   Hypertension    Hx, not current problems, no meds   LV dysfunction    EF 25-30% by echo 07/2011   Memory loss due to medical condition    due to stroke   MR (mitral regurgitation)    moderate to severe, echo 07/2011   Nausea with vomiting, unspecified 06/15/2021   Paroxysmal atrial fibrillation (HCC)    Peripheral vascular disease (HCC)    Shortness of breath    with exertion   Stroke [facility])    TIA's-left sided weakness   Tobacco abuse     Significant Hospital Events: Including procedures, antibiotic start and stop dates in addition to other pertinent events   2/20 - Cardiac arrest in HD, initial rhythm VF with CPR x 2 rounds, Epi/Mg/Ca, shocked x 2; after ROSC, monomorphic VT requiring shock x 2. Intubated at bedside in dialysis. Transferred to [facility] CVICU. Cardiology consulted. NE weaned off during day  2/24 failed vent wean, remains on pressors not able to wean. CRRT keeping even. Family meeting leaning towards comfort care, just need to arrange time when family can all come.  2/26 awake. Did about a hr on SBT. Mechanics and gas exchange OK but developed hemodynamic changes (SBP 40s-60s)  got a little more tachycardic  2/27 family meeting the day prior DNR status reconfirmed.  Plan to extubate over the weekend when his sister arrived from [place] following that we will discontinue CRRT.  Completed 7 days of Zosyn  Interim History / Subjective:  Really no significant change from yesterday Objective:  Blood pressure (!) 163/62, pulse 73, temperature 97.7 F (36.5 C), temperature source Axillary, resp. rate 19, height 5\' 7"  (1.702 m), weight 64.1 kg, SpO2 94%. CVP:  [7 mmHg-70 mmHg] 8 mmHg  Vent Mode: PRVC FiO2 (%):  [30 %] 30 % Set Rate:  [18 bmp] 18 bmp Vt Set:  [530 mL] 530 mL PEEP:  [5 cmH20] 5  cmH20 Plateau Pressure:  [27 cmH20-30 cmH20] 27 cmH20   Intake/Output Summary (Last 24 hours) at 03/08/2023 1128 Last data filed at 03/08/2023 1110 Gross per 24 hour  Intake 2642.29 ml  Output 2871.6 ml  Net -229.31 ml   Filed Weights   03/06/23 0655 03/07/23 0500 03/08/23 0423  Weight: 66.7 kg 65.6 kg 64.1 kg   Physical Examination:  General This is a chronically ill-appearing 63 year old male who remains on full ventilator support HEENT normocephalic atraumatic orally intubated no JVD Pulmonary: Some scattered rhonchi bilaterally.  No accessory use on full support Cardiac: Regular rate and rhythm Abdomen: Soft nontender no organomegaly. Still having fecal incontinence has a rectal tube in place. GU anuric Neuro awake. Interactive, has chronic left-sided hemiparesis. Extremities warm dry contracted.  The femoral line in the right groin is unremarkable currently  Resolved Hospital Problem List:  Cardiogenic shock  VT/VF in-hospital cardiac arrest 2/20 Post cardiac arrest lactic acidosis Hypervolemic hyponatremia Constipation Septic shock Assessment & Plan:   End-stage NICM (EF 30-35%) w/ chronic hypotension; and now refractory vasoplegia, intolerant to GDMT due to hypotension and renal failure Paroxysmal Afib and prolonged Qtc s/p in-hospital VT cardiac arrest 2/20 (TIME TO rosc 4 MIN) Prolonged Qtc on EKG 2/22.  Plan Cont mexiletine Avoid amiodarone given Qtc concern  avoid Qtc prolonging meds Continue midodrine, will change to scheduled at 15 mg 3 times daily eliquis BID Continue current norepinephrine and vasopressin dosing Weaning stress dose steroids, this never made a difference GDMT limited by shock Not a candidate for mechanical support   Presumed septic shock w/ refractory vasoplegia. Initially due to aspiration pneumonia.  This is now been treated his hypotension is no longer related to sepsis Plan Continue to trend CBC  Acute hypoxemic respiratory failure  following VF arrest w/ bilateral airspace disease most likely reflecting pulmonary edema  Likely aspiration pneumonia from cardiac arrest Plan Continue full vent support Okay to cycle on pressure as tolerated today.   Continue VAP bundle PAD protocol RASS goal 0 to -1 Plan is for one-way extubation once sister arrives from [place]  ESRD on HD TTS; PCKD I doubt he will tolerate iHD Plan Will continue CRRT through extubation, with plan to discontinue CRRT postextubation Can assess for IHD following CRRT discontinuation however family understands he is unlikely to be at a point to tolerate this  T2DM w/ hyperglycemia Controlled on current insulin regimen plan SSI q 4 goal BG 140-180  Moderate protein calorie malnutrition TF currently at goal Plan Cont tube feeds  Anemia w/out evidence of active bleeding Plan transfuse for Hb< 7 or hemodynamically significant bleeding; no current indications  Best Practice: (right click and "Reselect all SmartList Selections" daily)   Diet/type: tubefeeds DVT prophylaxis: DOAC GI prophylaxis: PPI Lines: Central line, Dialysis Catheter, and Arterial Line Foley:  N/A Code Status:  full code Last date of multidisciplinary goals of care discussion [2/22 with palliative care]  Critical care time 31 min      Critical care attending attestation note: I agree with the Advanced Practitioner's note, impression, and recommendations as outlined. I have taken an independent interval history, reviewed the chart and examined the patient. The following reflects my medical decision making and independent critical care time   Synopsis of assessment and plan: 63 year old male with end-stage renal disease on hemodialysis here status post V-fib cardiac arrest, complicated with ongoing shock requiring vasopressor support  Remain on Levophed and vasopressin He is still on CRRT   Physical exam: General: Critically ill-appearing male, orally  intubated HEENT: [place]/AT, eyes anicteric.  ETT and OGT in place Neuro: Opens eyes with vocal stimuli, intermittently following simple commands  Chest: Coarse breath sounds, no wheezes or rhonchi Heart: Regular rate and rhythm, no murmurs or gallops Abdomen: Soft, nontender, nondistended, bowel sounds present Skin: No rash  Labs and images were reviewed  Assessment and plan: Status post V-fib cardiac arrest Acute on chronic HFrEF Paroxysmal A-fib Septic shock due to aspiration pneumonia Acute respiratory failure with hypoxia End stage renal disease on CRRT Severe protein calorie malnutrition  Continue mexiletine Avoid QT prolonging medication including amiodarone and lidocaine Monitor intake and output He is on CRRT Remain in sinus rhythm Still requiring vasopressor support with Levophed and vasopressin Respiratory culture is growing gram-positive cocci Continue IV Zosyn Vancomycin was stopped as MRSA screen is negative Continue titrate vasopressor support Continue midodrine Palliative care is following Continue dietary supplements    This patient is critically ill with multiple organ system failure which requires frequent high complexity decision making, assessment, support, evaluation, and titration of therapies. This was completed through the application of advanced monitoring technologies and extensive interpretation of multiple databases.  During this encounter critical care time was devoted to patient care services described in this note for 30 minutes.    Cheri Fowler, MD [facility] See Amion for pager If no response to pager, please call [phone] until 7pm After 7pm, Please call E-link [phone]   03/08/2023, 3:53 PM

## 2023-03-09 ENCOUNTER — Encounter (HOSPITAL_COMMUNITY): Payer: Self-pay | Admitting: Internal Medicine

## 2023-03-09 DIAGNOSIS — J9601 Acute respiratory failure with hypoxia: Secondary | ICD-10-CM | POA: Diagnosis not present

## 2023-03-09 DIAGNOSIS — I469 Cardiac arrest, cause unspecified: Secondary | ICD-10-CM | POA: Diagnosis not present

## 2023-03-09 LAB — RENAL FUNCTION PANEL
Albumin: 2.2 g/dL — ABNORMAL LOW (ref 3.5–5.0)
Anion gap: 9 (ref 5–15)
BUN: 28 mg/dL — ABNORMAL HIGH (ref 6–20)
CO2: 24 mmol/L (ref 22–32)
Calcium: 9.2 mg/dL (ref 8.9–10.3)
Chloride: 101 mmol/L (ref 98–111)
Creatinine, Ser: 1.52 mg/dL — ABNORMAL HIGH (ref 0.61–1.24)
GFR, Estimated: 52 mL/min — ABNORMAL LOW (ref >60)
Glucose, Bld: 149 mg/dL — ABNORMAL HIGH (ref 70–99)
Phosphorus: 2.3 mg/dL — ABNORMAL LOW (ref 2.5–4.6)
Potassium: 4 mmol/L (ref 3.5–5.1)
Sodium: 134 mmol/L — ABNORMAL LOW (ref 135–145)

## 2023-03-09 LAB — CBC
HCT: 44.3 % (ref 39.0–52.0)
Hemoglobin: 12.8 g/dL — ABNORMAL LOW (ref 13.0–17.0)
MCH: 25 pg — ABNORMAL LOW (ref 26.0–34.0)
MCHC: 28.9 g/dL — ABNORMAL LOW (ref 30.0–36.0)
MCV: 86.5 fL (ref 80.0–100.0)
Platelets: 168 10*3/uL (ref 150–400)
RBC: 5.12 MIL/uL (ref 4.22–5.81)
RDW: 18.8 % — ABNORMAL HIGH (ref 11.5–15.5)
WBC: 14.3 10*3/uL — ABNORMAL HIGH (ref 4.0–10.5)
nRBC: 1.6 % — ABNORMAL HIGH (ref 0.0–0.2)

## 2023-03-09 LAB — GLUCOSE, CAPILLARY
Glucose-Capillary: 138 mg/dL — ABNORMAL HIGH (ref 70–99)
Glucose-Capillary: 114 mg/dL — ABNORMAL HIGH (ref 70–99)
Glucose-Capillary: 113 mg/dL — ABNORMAL HIGH (ref 70–99)

## 2023-03-09 LAB — MAGNESIUM: Magnesium: 2.5 mg/dL — ABNORMAL HIGH (ref 1.7–2.4)

## 2023-03-09 MED ORDER — MIDAZOLAM HCL 2 MG/2ML IJ SOLN
1.0000 mg | INTRAMUSCULAR | Status: DC | PRN
  Administered 2023-03-09 (×2): 2 mg via INTRAVENOUS
  Filled 2023-03-09: qty 2

## 2023-03-09 MED ORDER — GLYCOPYRROLATE 0.2 MG/ML IJ SOLN
0.4000 mg | INTRAMUSCULAR | Status: DC | PRN
  Administered 2023-03-09: 0.4 mg via INTRAVENOUS
  Filled 2023-03-09: qty 2

## 2023-03-09 MED ORDER — SODIUM CHLORIDE 0.9% FLUSH: 3.0000 mL | INTRAVENOUS | Status: DC | PRN

## 2023-03-09 MED ORDER — GLYCOPYRROLATE 0.2 MG/ML IJ SOLN: 0.4000 mg | INTRAMUSCULAR | Status: DC | PRN

## 2023-03-09 MED ORDER — SODIUM PHOSPHATES 45 MMOLE/15ML IV SOLN
15.0000 mmol | Freq: Once | INTRAVENOUS | Status: AC
  Administered 2023-03-09: 15 mmol via INTRAVENOUS
  Filled 2023-03-09: qty 5

## 2023-03-09 MED ORDER — MIDAZOLAM-SODIUM CHLORIDE 100-0.9 MG/100ML-% IV SOLN: 0.5000 mg/h | INTRAVENOUS | Status: DC

## 2023-03-09 MED ORDER — HYDROMORPHONE BOLUS VIA INFUSION
1.0000 mg | INTRAVENOUS | Status: DC | PRN
  Administered 2023-03-09 (×4): 3 mg via INTRAVENOUS

## 2023-03-09 MED ORDER — POLYVINYL ALCOHOL 1.4 % OP SOLN: 1.0000 [drp] | Freq: Four times a day (QID) | OPHTHALMIC | Status: DC | PRN

## 2023-03-09 MED ORDER — HYDROMORPHONE HCL-NACL 50-0.9 MG/50ML-% IV SOLN
1.0000 mg/h | INTRAVENOUS | Status: DC
  Administered 2023-03-09: 2 mg/h via INTRAVENOUS
  Filled 2023-03-09: qty 50

## 2023-03-09 MED ORDER — SODIUM CHLORIDE 0.9% FLUSH: 3.0000 mL | Freq: Two times a day (BID) | INTRAVENOUS | Status: DC

## 2023-03-09 DEATH — deceased

## 2023-04-02 MED FILL — Medication: Qty: 1 | Status: AC

## 2023-04-09 NOTE — Progress Notes (Signed)
 [facility] Progress Note   Subjective:  He has continued on CRRT.  He had 1.7 kg UF over 2/28 with CRRT.  He has been keep even as tolerated.  His family is planning on compassionate extubation today.  Bedside RN confirms these plans.  No family at bedside.   Review of systems:    Unable to obtain due to intubated and sedated    Objective Vitals:   03/20/2023 0445 03/19/2023 0500 03/16/2023 0515 04/01/2023 0530  BP: (!) 98/44 97/68 (!) 98/36 (!) 107/36  Pulse: (!) 59 62 64 64  Resp: 18 20 18 18   Temp:      TempSrc:      SpO2: 98% 100% 100% 100%  Weight:      Height:       Physical Exam       General adult male in bed critically ill  HEENT normocephalic atraumatic  Neck supple trachea midline Lungs coarse mechanical breath sounds, on vent Heart S1S2 no rub Abdomen soft nontender nondistended Extremities no edema lower extremities Neuro wakes to exam and nods or shakes head to respond to questions Access left internal jugular tunn dialysis catheter  OP HD: SW TTS  4h  B400   67.5kg   2K bath  TDC   Heparin none - last HD 2/15, post wt 69kg, coming off 0.5- 1.5 kg over - good attendance, rarely stays on past 3.5 hrs - hectorol 3 mcg iv three times per week - parsabiv 7.5mg       CXR 2/17 - IMPRESSION: vascular congestion and probable mild interstitial edema. Small left effusion. 2. Chronic pleural and parenchymal scarring on the right. Similar peripheral bandlike opacities in the left mid to lower lung which could be due to chronic scarring or atelectasis.    Assessment/ Plan: Acute on chronic hypoxic resp failure - had HD 2/18 w/ 2.3 L UF, and shortness of breath improved at that time.  Later intubated after cardiac arrest on 2/20.   Cardiac arrest / fib and VT - occurred 2/20 while on dialysis. Seen by cardiology.  Per critical care Shock - per charting presumed septic due to aspiration PNA; post arrest, on pressors per critical care.  ESRD  On CRRT as  aligns with goals of care.  Note plans for compassionate extubation this weekend.  Appreciate critical care and palliative care teams and defer to them for when to remove CRRT.  4K fluids. Goal keep even as tolerated   Usually on HD TTS. Had 2 hrs HD prior to code 2/20. Worsening shock prompted CRRT which was started on 2/21.  Note the concern that patient's heart is not strong enough to tolerate intermittent hemodialysis.  Appreciate palliative care  Secondary hyperparathyroidism - hectorol held earlier for elevated corrected calcium. Now off binders on CRRT and repleting phos intermittently PRN Anemia of esrd - Hb acceptable; no indication for ESA End-stage bivent CHF - EF 20% then later was up 35%. No options per cardiology 06/2022  COPD - note decreased respiratory reserve; usually on home O2 2.5- 3 L [place] per charting H/o CVA - noted   Disposition - in ICU on CRRT with palliative care following as above and compassionate extubation planned for today.  Appreciate all teams and staff involved in the care of the patient and his family    Recent Labs  Lab 03/08/23 0418 03/08/23 1602 04/04/2023 0353  HGB 12.6*  --  12.8*  ALBUMIN 2.2* 2.3* 2.2*  CALCIUM 8.9 9.1 9.2  PHOS 3.0 2.1* 2.3*  CREATININE 1.54* 1.54* 1.52*  K 3.9 4.5 4.0    Inpatient medications:  apixaban  5 mg Per Tube BID   Chlorhexidine Gluconate Cloth  6 each Topical Daily   docusate  100 mg Per Tube BID   feeding supplement (PROSource TF20)  60 mL Per Tube TID   fiber supplement (BANATROL TF)  60 mL Per Tube BID   hydrocortisone sod succinate (SOLU-CORTEF) inj  50 mg Intravenous Daily   insulin aspart  0-6 Units Subcutaneous Q4H   mexiletine  200 mg Per Tube Q8H   midodrine  15 mg Per Tube Q8H   mouth rinse  15 mL Mouth Rinse Q2H   pantoprazole (PROTONIX) IV  40 mg Intravenous Q24H   polyethylene glycol  17 g Per Tube Daily   sodium chloride flush  10-40 mL Intracatheter Q12H    feeding supplement (OSMOLITE 1.5 CAL)  50 mL/hr at 03/29/2023 0500   heparin sodium (porcine)     norepinephrine (LEVOPHED) Adult infusion 12 mcg/min (04/06/2023 0500)   prismasol BGK 4/2.5 400 mL/hr at 03/08/23 2330   prismasol BGK 4/2.5 400 mL/hr at 03/08/23 2230   prismasol BGK 4/2.5 1,400 mL/hr at 04/07/2023 0251   vasopressin 0.03 Units/min (03/26/2023 0500)   acetaminophen (TYLENOL) oral liquid 160 mg/5 mL, fentaNYL (SUBLIMAZE) injection, fentaNYL (SUBLIMAZE) injection, Gerhardt's butt cream, heparin, heparin sodium (porcine), HYDROmorphone (DILAUDID) injection, ipratropium-albuterol, midazolam, mouth rinse, prochlorperazine, sodium chloride flush     Estanislado Emms, MD 6:12 AM 03/29/2023

## 2023-04-09 NOTE — Progress Notes (Signed)
 NAME:  Nathaniel White, MRN:  [id number], DOB:  1960/09/02, LOS: 11 ADMISSION DATE:  02/25/2023 CONSULTATION DATE:  02/28/2023 REFERRING MD:  Jerral Ralph - TRH,  CHIEF COMPLAINT:  Cardiac arrest   History of Present Illness:  63 year old man who presented to [facility] 2/17 for SOB after truncated HD session (last 2/15). PMHx significant for HTN, HLD, CVA (with L-sided deficits), PVD, PAF (on Eliquis), HFrEF with NICM, severe MR, COPD (on 2-3L HOT), T2DM, ESRD on HD TTS. Recent admission 1/27-1/29 for volume overload/pulmonary edema.   Patient was reportedly slated for discharge 2/19 but remained in-house due to weather conditions and concern he would not be able to to have transport to HD 2/20; HD was completed overnight 2/19-2/20. While in HD 2/20AM, Code Blue was called for VF arrest. CPR x 4 minutes with Epi x 1, amio 150mg  and 300mg , lidocaine x 1, Mg/Ca repletion. ROSC was achieved and patient was awake and alert, repeatedly saying "help me" and "I can't breathe". NRB was attempted with ongoing extremis. On two additional occasions, patient rhythm deteriorated into monomorphic VT and he was shocked x 2. Decision was made to intubate.  PCCM consulted for ICU transfer and management.  Pertinent Medical History:   Past Medical History:  Diagnosis Date   Anemia    Anxiety    Asthma    Complication from renal dialysis device 11/02/2013   Complication of anesthesia 2017   according to pt and spouse pt was moving around and cough while under and pt had difficulty waking up so the anesthesia had to be reversed. and pt admitted to ICU.    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/2008   Qualifier: Diagnosis of  By: Russella Dar MD Marylu Lund    ESRD    on HD, T-TH-Sat - [place]    Hemiparesis due to old stroke [facility])    left   HFrEF (heart failure with reduced ejection fraction) (HCC)    NICM // TTE 06/2022: EF 25-30, sever RV dysfunction, mod to severe MR   Hypertension    Hx, not current problems, no meds   LV dysfunction    EF 25-30% by echo 07/2011   Memory loss due to medical condition    due to stroke   MR (mitral regurgitation)    moderate to severe, echo 07/2011   Nausea with vomiting, unspecified 06/15/2021   Paroxysmal atrial fibrillation (HCC)    Peripheral vascular disease (HCC)    Shortness of breath    with exertion   Stroke [facility])    TIA's-left sided weakness   Tobacco abuse     Significant Hospital Events: Including procedures, antibiotic start and stop dates in addition to other pertinent events   2/20 - Cardiac arrest in HD, initial rhythm VF with CPR x 2 rounds, Epi/Mg/Ca, shocked x 2; after ROSC, monomorphic VT requiring shock x 2. Intubated at bedside in dialysis. Transferred to [facility] CVICU. Cardiology consulted. NE weaned off during day  2/24 failed vent wean, remains on pressors not able to wean. CRRT keeping even. Family meeting leaning towards comfort care, just need to arrange time when family can all come.  2/26 awake. Did about a hr on SBT. Mechanics and gas exchange OK but developed hemodynamic changes (SBP 40s-60s)  got a little more tachycardic  2/27 family meeting the day prior DNR status reconfirmed.  Plan to extubate over the weekend when his sister arrived from [place] following that we will discontinue CRRT.  Completed 7 days of Zosyn 2/28 stable, no significant change compared to prior 3/1 resting comfortably in bed on CRRT  Interim History / Subjective:  Sedated on vent  Objective:  Blood pressure (!) 89/77, pulse 71, temperature 98.2 F (36.8 C), temperature source Axillary, resp. rate (!) 25, height 5\' 7"  (1.702 m), weight 64.9 kg, SpO2 98%. CVP:  [2 mmHg-21 mmHg] 11 mmHg  Vent Mode: CPAP;PSV FiO2 (%):  [30 %] 30 % Set  Rate:  [18 bmp] 18 bmp Vt Set:  [530 mL] 530 mL PEEP:  [5 cmH20] 5 cmH20 Pressure Support:  [10 cmH20] 10 cmH20 Plateau Pressure:  [25 cmH20-27 cmH20] 26 cmH20   Intake/Output Summary (Last 24 hours) at 04/08/2023 0756 Last data filed at 04/06/2023 0730 Gross per 24 hour  Intake 2005.67 ml  Output 2437 ml  Net -431.33 ml   Filed Weights   03/07/23 0500 03/08/23 0423 04/02/2023 0403  Weight: 65.6 kg 64.1 kg 64.9 kg   Physical Examination:  General: Chronic ill-appearing middle-age male lying in bed on CRRT in no acute distress HEENT: ETT, MM pink/moist, PERRL,  Neuro: Sedated on vent, eyes open to verbal command CV: s1s2 regular rate and rhythm, no murmur, rubs, or gallops,  PULM: Clear to auscultation bilaterally, no increased work of breathing, no added breath sounds GI: soft, bowel sounds active in all 4 quadrants, non-tender, non-distended, tolerating TF Extremities: warm/dry, no edema  Skin: no rashes or lesions  Resolved Hospital Problem List:  Cardiogenic shock  VT/VF in-hospital cardiac arrest 2/20 Post cardiac arrest lactic acidosis Hypervolemic hyponatremia Constipation Septic shock  Assessment & Plan:   End-stage NICM (EF 30-35%) w/ chronic hypotension; and now refractory vasoplegia, intolerant to GDMT due to hypotension and renal failure Paroxysmal Afib and prolonged Qtc s/p in-hospital VT cardiac arrest 2/20 (TIME TO rosc 4 MIN) Prolonged Qtc  -Not a candidate for mechanical support -on EKG 2/22.  P: Continue mexiletine as antiarrhythmic Avoid amiodarone given prolonged QTc Continue high-dose midodrine 15 mg 3 times daily Twice daily Eliquis Continuous telemetry Pressors for MAP goal greater than 65 GDMT limited by shock  Presumed septic shock w/ refractory vasoplegia. Initially due to aspiration pneumonia.  -S/p 7 days of IV antibiotics, hypotension is no longer related to sepsis P: Vasopressor support as above  Acute hypoxemic respiratory failure  following VF arrest w/ bilateral airspace disease most likely reflecting pulmonary edema  Likely aspiration pneumonia from cardiac arrest -Completed 7 days of IV Zosyn P: Tentative plan for one-way extubation once sister arrives from [place] Continue ventilator support with lung protective strategies  Wean PEEP and FiO2 for sats greater than 90%. Head of bed elevated 30 degrees. Plateau pressures less than 30 cm H20.  Follow intermittent chest x-ray and ABG.   SAT/SBT as tolerated, mentation preclude extubation  Ensure adequate pulmonary hygiene  Follow cultures  VAP bundle in place  PAD protocol  ESRD on HD TTS; PCKD P: Nephrology following, appreciate assistance Remains on CRRT until family can arrive before transition to comfort with plans to discontinue once family arrives  T2DM w/ hyperglycemia -Controlled on current insulin regimen P: Continue SSI CBG goal 140-180 CBG checks every 4  Moderate protein calorie malnutrition P: Continue tube feeds  Anemia w/out evidence of active bleeding P: Trend CBC Transfuse per  protocol Hemoglobin goal greater than 7  Best Practice: (right click and "Reselect all SmartList Selections" daily)   Diet/type: tubefeeds DVT prophylaxis: DOAC GI prophylaxis: PPI Lines: Central line, Dialysis Catheter, and Arterial Line Foley:  N/A Code Status:  full code Last date of multidisciplinary goals of care discussion [2/22 with palliative care]  Critical care time:  CRITICAL CARE Performed by: Vadie Principato D. Harris   Total critical care time: 34 minutes  Critical care time was exclusive of separately billable procedures and treating other patients.  Critical care was necessary to treat or prevent imminent or life-threatening deterioration.  Critical care was time spent personally by me on the following activities: development of treatment plan with patient and/or surrogate as well as nursing, discussions with consultants, evaluation of  patient's response to treatment, examination of patient, obtaining history from patient or surrogate, ordering and performing treatments and interventions, ordering and review of laboratory studies, ordering and review of radiographic studies, pulse oximetry and re-evaluation of patient's condition.  Naseem Varden D. Harris, NP-C [facility] Personal contact information can be found on Amion  If no contact or response made please call [phone] 03/21/2023, 8:30 AM

## 2023-04-09 NOTE — Death Summary Note (Signed)
 DEATH SUMMARY   Patient Details  Name: Nathaniel White MRN: [id number] DOB: 10-09-1960  Admission/Discharge Information   Admit Date:  March 24, 2023  Date of Death: Date of Death: 04/05/2023  Time of Death: Time of Death: Apr 14, 1719  Length of Stay: Apr 15, 2023  Referring Physician: Claiborne Rigg, NP   Reason(s) for Hospitalization  Status post V-fib cardiac arrest Acute on chronic HFrEF Lactic acidosis, resolved Hypervolemic hyponatremia Prolonged QTc Paroxysmal A-fib Acute respiratory failure with hypoxia Combined cardiogenic and septic shock due to aspiration pneumonia End stage Renal disease on CRRT Moderate protein calorie malnutrition Diabetes type 2 with hyperglycemia Anemia of chronic disease  Diagnoses  Preliminary cause of death: Withdrawal of care in the setting of multisystem organ failure and persistent shock Secondary Diagnoses (including complications and co-morbidities):  Principal Problem:   Fluid overload Active Problems:   ESRD on dialysis (HCC)   Acute on chronic hypoxic respiratory failure (HCC)   Paroxysmal atrial fibrillation (HCC)   Volume overload   Acute exacerbation of CHF (congestive heart failure) (HCC)   Cardiac arrest (HCC)   On mechanically assisted ventilation (HCC)   Aspiration pneumonia of both lower lobes due to gastric secretions (HCC)   Shock (HCC)   Aspiration pneumonia due to gastric secretions (HCC)   Septic shock (HCC)   Acute on chronic HFrEF (heart failure with reduced ejection fraction) [facility])   Brief Hospital Course (including significant findings, care, treatment, and services provided and events leading to death)  Nathaniel White is a 63 y.o. year old male who presented to [facility] 03/24/2023 for SOB after truncated HD session (last 2/15). PMHx significant for HTN, HLD, CVA (with L-sided deficits), PVD, PAF (on Eliquis), HFrEF with NICM, severe MR, COPD (on 2-3L HOT), T2DM, ESRD on HD TTS. Recent admission 1/27-1/29 for volume overload/pulmonary  edema.    Patient was reportedly slated for discharge 2/19 but remained in-house due to weather conditions and concern he would not be able to to have transport to HD 2/20; HD was completed overnight 2/19-2/20. While in HD 2/20AM, Code Blue was called for VF arrest. CPR x 4 minutes with Epi x 1, amio 150mg  and 300mg , lidocaine x 1, Mg/Ca repletion. ROSC was achieved and patient was awake and alert, repeatedly saying "help me" and "I can't breathe". NRB was attempted with ongoing extremis. On two additional occasions, patient rhythm deteriorated into monomorphic VT and he was shocked x 2. Decision was made to intubate.   2/20 - Cardiac arrest in HD, initial rhythm VF with CPR x 2 rounds, Epi/Mg/Ca, shocked x 2; after ROSC, monomorphic VT requiring shock x 2. Intubated at bedside in dialysis. Transferred to [facility] CVICU. Cardiology consulted. NE weaned off during day  2/24 failed vent wean, remains on pressors not able to wean. CRRT keeping even. Family meeting leaning towards comfort care, just need to arrange time when family can all come.  2/26 awake. Did about a hr on SBT. Mechanics and gas exchange OK but developed hemodynamic changes (SBP 40s-60s) got a little more tachycardic  2/27 family meeting the day prior DNR status reconfirmed.   2/28 stable, no significant change compared to prior 3/1 resting comfortably in bed on CRRT  Due to persistent shock is status and dependent on CRRT and multisystem organ failure patient family decided to keep him DNR and proceed with palliative extubation and comfort care.  Patient was palliatively extubated and he passed on 04-05-23 at 5:21 PM.  Patient's family was at bedside  Pertinent Labs and  Studies  Significant Diagnostic Studies DG Chest Port 1 View Result Date: 03/07/2023 CLINICAL DATA:  Aspiration into respiratory tract. EXAM: PORTABLE CHEST 1 VIEW COMPARISON:  March 02, 2023. FINDINGS: Stable cardiomediastinal silhouette. Endotracheal and nasogastric  tubes are unchanged. Left internal jugular dialysis catheter is unchanged. Stable bibasilar opacities are noted concerning for edema or possibly atelectasis with small pleural effusions. Right rib fractures are again noted. IMPRESSION: Stable support apparatus. Stable bibasilar opacities concerning for edema or possibly atelectasis with small pleural effusions. Electronically Signed   By: Lupita Raider M.D.   On: 03/07/2023 10:07   DG Chest Port 1 View Result Date: 03/02/2023 CLINICAL DATA:  Respirator dependent. EXAM: PORTABLE CHEST 1 VIEW COMPARISON:  03/01/2023 FINDINGS: Endotracheal tube is appropriately positioned and 3.3 cm above the carina. Left jugular dialysis catheter is stable with the tip in the right atrium region. Cardiac pads overlying the chest. Patchy bilateral lung densities are unchanged. Heart size remains enlarged. Blunting at the right costophrenic angle could represent pleural fluid. Negative for pneumothorax. Nasogastric tube extends into the abdomen but the tip is beyond the image. Evidence for a displaced right lateral fourth rib fracture and possibly displaced right third rib fracture. IMPRESSION: 1. Patchy bilateral lung densities are unchanged. 2. Support apparatuses are appropriately positioned. 3. Right rib fractures. Suspect these are related to recent CPR. No clear evidence for pneumothorax. Electronically Signed   By: Richarda Overlie M.D.   On: 03/02/2023 09:44   DG Abd Portable 1V Result Date: 03/01/2023 CLINICAL DATA:  Ileus. EXAM: PORTABLE ABDOMEN - 1 VIEW COMPARISON:  March 10, 2021.  September 15, 2022. FINDINGS: Nasogastric tube tip is seen in proximal stomach. No abnormal bowel dilatation is noted. Extensive rounded calcifications are noted bilaterally consistent with probable polycystic kidney disease. IMPRESSION: Nasogastric tube tip seen in proximal stomach. No abnormal bowel dilatation. Electronically Signed   By: Lupita Raider M.D.   On: 03/01/2023 14:29   DG Chest  Port 1 View Result Date: 03/01/2023 CLINICAL DATA:  Respiratory dependence. EXAM: PORTABLE CHEST 1 VIEW COMPARISON:  February 28, 2023. FINDINGS: Stable cardiomegaly. Endotracheal and nasogastric tubes are unchanged. Left internal jugular dialysis catheter is unchanged. Bibasilar atelectasis or edema is noted with small pleural effusions. Bony thorax is unremarkable. IMPRESSION: Stable support apparatus. Stable bibasilar atelectasis or edema is noted with associated small effusions. Electronically Signed   By: Lupita Raider M.D.   On: 03/01/2023 14:26   DG CHEST PORT 1 VIEW Result Date: 02/28/2023 CLINICAL DATA:  [id number]. Encounter for nasogastric tube placement. EXAM: PORTABLE CHEST 1 VIEW COMPARISON:  Portable chest earlier today at 3:21 a.m. FINDINGS: 3:58 a.m. Current imaging excludes the right-greater-than-left lung apices. NGT interval insertion, curves to the left in the stomach with the tip in the proximal fundal area. There are overlying electrical pads, multiple overlying wires. ETT tip 2.4 cm from the carina. A left IJ dialysis line again terminates at about the superior cavoatrial junction. Stable cardiomegaly. There is mild central vascular prominence and central edema. Small pleural effusions. Chronic peripheral pleural-parenchymal disease in the mid to lower lung fields is again noted. Superimposed pneumonia would be difficult to exclude. There is continued patchy consolidation in the lung bases. No new infiltrate is seen. The mediastinum is stable. The aorta is heavily calcified. No new skeletal finding. IMPRESSION: 1. NGT tip in the proximal fundal area. 2. ETT tip 2.4 cm from the carina. 3. Stable cardiomegaly with mild central vascular prominence and central edema. 4. Small  pleural effusions with unchanged patchy consolidation in the bases. 5. Chronic peripheral pleural-parenchymal disease in the mid to lower lung fields. Superimposed pneumonia would be difficult to exclude. 6. Aortic  atherosclerosis. Electronically Signed   By: Almira Bar M.D.   On: 02/28/2023 04:24   Portable Chest x-ray Result Date: 02/28/2023 CLINICAL DATA:  Endotracheal tube placement EXAM: PORTABLE CHEST 1 VIEW COMPARISON:  02/25/2023 FINDINGS: Interval endotracheal tube placement with its tip 3.1 cm above the carina. Left internal jugular hemodialysis catheter is unchanged with its tip within the right atrium. Pulmonary insufflation is stable. Asymmetric mid lung zone interstitial and alveolar pulmonary infiltrate appears stable possibly representing asymmetric pulmonary edema superimposed upon fibrotic interstitial lung disease, but not well characterized on this examination. No pneumothorax. Small right pleural effusion is stable. Cardiac size is mildly enlarged, unchanged. No acute bone abnormality. Advanced vascular calcifications noted within the axilla bilaterally. IMPRESSION: 1. Endotracheal tube tip 3.1 cm above the carina. 2. Stable asymmetric mid lung zone interstitial and alveolar pulmonary infiltrate possibly representing asymmetric pulmonary edema superimposed upon fibrotic interstitial lung disease, but not well characterized on this examination. 3. Stable small right pleural effusion. Electronically Signed   By: Helyn Numbers M.D.   On: 02/28/2023 03:35   DG Chest Portable 1 View Result Date: 02/25/2023 CLINICAL DATA:  Shortness of breath EXAM: PORTABLE CHEST 1 VIEW COMPARISON:  02/04/2023, 01/16/2023, CT 12/27/2022, chest x-ray 09/04/2021, 03/03/2018 FINDINGS: Left-sided central venous catheter tip at the right atrium. Chronic pleural and parenchymal scarring on the right. Cardiomegaly with vascular congestion and probable mild interstitial edema. Suspicion of small left effusion. Similar peripheral bandlike opacities in the left mid to lower lung which could be due to chronic scarring or atelectasis. IMPRESSION: 1. Cardiomegaly with vascular congestion and probable mild interstitial edema.  Suspicion of small left effusion. 2. Chronic pleural and parenchymal scarring on the right. Similar peripheral bandlike opacities in the left mid to lower lung which could be due to chronic scarring or atelectasis. Electronically Signed   By: Jasmine Pang M.D.   On: 02/25/2023 22:06    Microbiology Recent Results (from the past 240 hours)  Culture, Respiratory w Gram Stain     Status: None   Collection Time: 03/01/23  8:17 AM   Specimen: Tracheal Aspirate; Respiratory  Result Value Ref Range Status   Specimen Description TRACHEAL ASPIRATE  Final   Special Requests NONE  Final   Gram Stain   Final    MODERATE WBC PRESENT, PREDOMINANTLY PMN RARE SQUAMOUS EPITHELIAL CELLS PRESENT FEW GRAM POSITIVE COCCI IN CHAINS    Culture   Final    FEW Normal respiratory flora-no Staph aureus or Pseudomonas seen Performed at [facility] Lab, [address], [place], [place] [address]    Report Status 03/04/2023 FINAL  Final    Lab Basic Metabolic Panel: Recent Labs  Lab 03/05/23 0324 03/05/23 1515 03/06/23 0500 03/06/23 0856 03/07/23 0403 03/07/23 1713 03/08/23 0418 03/08/23 1602 03/12/2023 0353  NA 136   < > 138   < > 137 137 137 136 134*  K 4.4   < > 4.2   < > 3.9 4.1 3.9 4.5 4.0  CL 97*   < > 103   < > 99 102 102 102 101  CO2 24   < > 25   < > 24 25 24 25 24   GLUCOSE 164*   < > 129*   < > 160* 164* 148* 152* 149*  BUN 23*   < >  25*   < > 27* 30* 27* 28* 28*  CREATININE 1.89*   < > 1.64*   < > 1.53* 1.61* 1.54* 1.54* 1.52*  CALCIUM 8.6*   < > 9.4   < > 9.3 9.0 8.9 9.1 9.2  MG 2.4  --  2.5*  --  2.6*  --  2.5*  --  2.5*  PHOS 2.3*   < > 2.4*   < > 2.6 2.4* 3.0 2.1* 2.3*   < > = values in this interval not displayed.   Liver Function Tests: Recent Labs  Lab 03/07/23 0403 03/07/23 1713 03/08/23 0418 03/08/23 1602 04/02/2023 0353  ALBUMIN 2.2* 2.2* 2.2* 2.3* 2.2*   No results for input(s): "LIPASE", "AMYLASE" in the last 168 hours. No results for input(s): "AMMONIA" in the  last 168 hours. CBC: Recent Labs  Lab 03/04/23 0327 03/06/23 0500 03/06/23 0856 03/07/23 0403 03/08/23 0418 03/13/2023 0353  WBC 9.3 7.8  --  9.9 13.6* 14.3*  HGB 10.8* 11.4* 14.3 12.1* 12.6* 12.8*  HCT 37.6* 39.7 42.0 41.6 42.8 44.3  MCV 86.4 87.4  --  86.1 85.4 86.5  PLT 156 141*  --  166 177 168   Cardiac Enzymes: No results for input(s): "CKTOTAL", "CKMB", "CKMBINDEX", "TROPONINI" in the last 168 hours. Sepsis Labs: Recent Labs  Lab 03/04/23 0327 03/06/23 0500 03/07/23 0403 03/08/23 0418 03/22/2023 0353  PROCALCITON 1.84  --   --   --   --   WBC 9.3 7.8 9.9 13.6* 14.3*    Procedures/Operations     Karlea Mckibbin 03/10/2023, 4:46 PM

## 2023-04-09 NOTE — Progress Notes (Addendum)
 Daily Progress Note   Patient Name: Nathaniel White       Date: 03/18/2023 DOB: 11-03-1960  Age: 63 y.o. MRN#: [id number] Attending Physician: Cheri Fowler, MD Primary Care Physician: Claiborne Rigg, NP Admit Date: 02/25/2023  Reason for Consultation/Follow-up: Establishing goals of care  Subjective: I have reviewed medical records including EPIC notes, MAR, any available advanced directives as necessary, and labs.   9:27 AM Called sister/HCPOA Marylene Land - she plans to arrive to hospital at 12p today and states the rest of the family should be here by 1p. PMT will plan to meet family at 1:15-1:30p to discuss extubation today and next steps.  1:30 PM Received report from primary RN - no acute concerns.  Met family at patient's bedside - HCPOA/[name], mother/Ellen, son/Will and many other family present. Primary RN also present.  Emotional support provided to family. Provided updates on patient's current clinical status per their request.  Discussed plan for extubation today at length. After discussions family are agreeable to: Extubation today Start dilaudid infusion for pain/dyspnea management - will start at least 1 hour prior to extubation Stop CRRT and tube feeds prior to extubation Stop vaso and noreepi at time of extubation Stop IVF Continue cardiac monitoring but with comfort screen in patient's room  Goal is to allow nature to take it's course after stopping life prolonging interventions. Prognosis reviewed. They understand it is anticipated patient will likely decline and pass away within the next several hours after extubation. If patient declines, goal is full comfort care. If stabilizes, PMT will follow up with family tomorrow for ongoing GOC.   All questions and concerns  addressed. Encouraged to call with questions and/or concerns. PMT card provided.  Length of Stay: 11  Current Medications: Scheduled Meds:   apixaban  5 mg Per Tube BID   Chlorhexidine Gluconate Cloth  6 each Topical Daily   docusate  100 mg Per Tube BID   feeding supplement (PROSource TF20)  60 mL Per Tube TID   fiber supplement (BANATROL TF)  60 mL Per Tube BID   hydrocortisone sod succinate (SOLU-CORTEF) inj  50 mg Intravenous Daily   insulin aspart  0-6 Units Subcutaneous Q4H   mexiletine  200 mg Per Tube Q8H   midodrine  15 mg Per Tube Q8H   mouth rinse  15  mL Mouth Rinse Q2H   pantoprazole (PROTONIX) IV  40 mg Intravenous Q24H   polyethylene glycol  17 g Per Tube Daily   sodium chloride flush  10-40 mL Intracatheter Q12H    Continuous Infusions:  feeding supplement (OSMOLITE 1.5 CAL) 50 mL/hr at 03/10/2023 0900   heparin sodium (porcine)     norepinephrine (LEVOPHED) Adult infusion 12 mcg/min (03/25/2023 0900)   prismasol BGK 4/2.5 400 mL/hr at 03/08/23 2330   prismasol BGK 4/2.5 400 mL/hr at 03/08/23 2230   prismasol BGK 4/2.5 1,400 mL/hr at 04/03/2023 2956   sodium PHOSPHATE IVPB (in mmol) 43 mL/hr at 03/29/2023 0900   vasopressin 0.03 Units/min (03/23/2023 0900)    PRN Meds: acetaminophen (TYLENOL) oral liquid 160 mg/5 mL, fentaNYL (SUBLIMAZE) injection, fentaNYL (SUBLIMAZE) injection, Gerhardt's butt cream, heparin, heparin sodium (porcine), HYDROmorphone (DILAUDID) injection, ipratropium-albuterol, midazolam, mouth rinse, prochlorperazine, sodium chloride flush  Physical Exam Vitals and nursing note reviewed.  Constitutional:      General: He is not in acute distress.    Appearance: He is ill-appearing.     Interventions: He is intubated.  Pulmonary:     Effort: No respiratory distress. He is intubated.  Skin:    General: Skin is warm and dry.  Neurological:     Motor: Weakness present.             Vital Signs: BP (!) 107/35   Pulse 69   Temp 99.6 F (37.6 C)  (Oral)   Resp 20   Ht 5\' 7"  (1.702 m)   Wt 64.9 kg   SpO2 95%   BMI 22.41 kg/m  SpO2: SpO2: 95 % O2 Device: O2 Device: Ventilator O2 Flow Rate: O2 Flow Rate (L/min): 30 L/min  Intake/output summary:  Intake/Output Summary (Last 24 hours) at 03/17/2023 2130 Last data filed at 03/17/2023 8657 Gross per 24 hour  Intake 2174.72 ml  Output 2450 ml  Net -275.28 ml   LBM: Last BM Date : 04/02/2023 Baseline Weight: Weight:  (In ER stretcher that is not zero'd out) Most recent weight: Weight: 64.9 kg       Palliative Assessment/Data: PPS 10% once tube feeds stopped      Patient Active Problem List   Diagnosis Date Noted   Acute on chronic HFrEF (heart failure with reduced ejection fraction) (HCC) 03/06/2023   Aspiration pneumonia due to gastric secretions (HCC) 03/04/2023   Septic shock (HCC) 03/04/2023   On mechanically assisted ventilation (HCC) 03/01/2023   Aspiration pneumonia of both lower lobes due to gastric secretions (HCC) 03/01/2023   Shock (HCC) 03/01/2023   Cardiac arrest (HCC) 02/28/2023   Fluid overload 02/26/2023   Dyspnea 02/05/2023   Acute exacerbation of CHF (congestive heart failure) (HCC) 01/15/2023   CAP (community acquired pneumonia) 01/15/2023   Hyperkalemia 01/15/2023   Chronic ITP (idiopathic thrombocytopenia) (HCC) 01/15/2023   Acute hypoxic respiratory failure (HCC) 01/15/2023   Respiratory failure (HCC) 01/15/2023   Hypokalemia 12/28/2022   Prolonged QT interval 12/28/2022   Chronic hypoxic respiratory failure (HCC) 12/28/2022   Chronic systolic CHF (congestive heart failure) (HCC) 12/28/2022   Elevated troponin 12/28/2022   Acute bronchitis due to Rhinovirus 12/28/2022   First degree heart block 09/17/2022   Puncture wound of left knee with complication 09/15/2022   Open wound of penis with complication 09/15/2022   Sepsis secondary to penile infection 09/15/2022   Diabetes mellitus without complication (HCC) 09/15/2022   Encounter for screening  for respiratory tuberculosis 08/20/2022   Mitral regurgitation 07/03/2022  Ventricular tachycardia, unspecified (HCC) 06/20/2022   Palpitations 06/19/2022   Volume overload 09/14/2021   COPD with acute exacerbation [facility])    Pulmonary embolus, right (HCC) 09/07/2021   Severe pulmonary hypertension (HCC) 09/07/2021   End-stage renal disease on hemodialysis (HCC) 09/07/2021   Diabetes mellitus type 2, controlled, without complications (HCC) 09/07/2021   Anemia of chronic disease 09/07/2021   SOB (shortness of breath) 09/04/2021   History of pulmonary embolism 09/04/2021   Pulmonary embolism (HCC) 09/04/2021   NSVT (nonsustained ventricular tachycardia) (HCC) 06/26/2021   Hypoglycemia 06/24/2021   Cirrhosis (HCC) 06/23/2021   Confusion 06/22/2021   Necrotizing pneumonia (HCC) 06/21/2021   CVA (cerebral vascular accident) (HCC) 06/21/2021   Paroxysmal atrial fibrillation (HCC) 06/21/2021   Chronic hypotension 06/21/2021   Unusual Appearance with Innumerable Lesions on CT scan, needs MRI 06/21/2021   Aortic atherosclerosis (HCC) 06/21/2021   ESRD (end stage renal disease) (HCC) 06/20/2021   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 (HCC) 06/20/2021   Nausea with vomiting, unspecified 06/15/2021   Acute on chronic hypoxic respiratory failure (HCC) 03/10/2021   Shock circulatory (HCC) 03/10/2021   Hemodialysis-associated hypotension 03/10/2021   Finger infection    Paronychia of finger, left 02/03/2021   Left hand pain 12/29/2020   Allergy, unspecified, initial encounter 09/28/2019   Anaphylactic shock, unspecified, initial encounter 09/28/2019   Pressure ulcer of ankle 09/01/2018   Thrombocytopenia (HCC) 01/17/2015   Transfusion history 01/17/2015   Hypercalcemia 11/01/2014   Dependence on renal dialysis (HCC) 03/26/2014   Encounter for fitting and adjustment of extracorporeal dialysis catheter (HCC) 03/26/2014   Diarrhea, unspecified 01/03/2014    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/10/2013   Pain, unspecified 09/23/2013   Cough 09/07/2013   HFrEF (heart failure with reduced ejection fraction) (HCC) 08/14/2013   Coagulation defect, unspecified (HCC) 12/15/2012   Moderate protein-calorie malnutrition (HCC) 08/07/2012   Restrictive lung disease 07/06/2012   History of CVA (cerebrovascular accident) 08/13/2011   ESRD on dialysis (HCC) 03/13/2011   HTN (hypertension) 03/13/2011   H/O 03/13/2011   Anemia 03/13/2011   Fluid overload, unspecified 03/13/2011   History of nonadherence to medical treatment 03/13/2011   Non-insulin dependent type 2 diabetes mellitus (HCC) 03/13/2011   Tobacco abuse 03/13/2011   Kidney transplant status 11/28/2008   HEMATOCHEZIA 10/04/2008   Iron deficiency anemia, unspecified 03/31/2003   Patient's noncompliance with other medical treatment and regimen 03/31/2003   Personal history of nicotine dependence 03/31/2003   Secondary hyperparathyroidism of renal origin (HCC) 03/31/2003    Palliative Care Assessment & Plan   Patient Profile: 63 y.o. male with past medical history of ESRD on HD TTS, COPD, chronic hypoxemic respiratory failure on 2.5 L home oxygen, chronic HFrEF (EF 35-40% 12/2022), severe mitral regurgitation, paroxysmal A-fib on Eliquis, chronic hypotension on midodrine, chronic anemia, PVD, CVA/TIA was admitted on 02/25/2023 with acute on chronic hypoxemic respiratory failure secondary to volume overload/pulmonary edema in setting of ESRD on HD, acute on chronic HFrEF and known severe mitral regurgitation, hypocalcemia, metabolic acidosis, elevated troponin, QT prolongation, end stage NICM (EF 30-35%) with chronic hypotension. His hospital course was complicated by code blue event on 2/21 - during HD patient went into VF arrest while receiving HD. CPR x4 minutes with ACLS medications administered. ROSC was achieved; however, on two additional occasions, patient rhythm deteriorated into  monomorphic VT and he was shocked x 2. Decision was made to intubate.   Assessment: Principal Problem:   Fluid overload Active Problems:   ESRD on dialysis (  HCC)   Acute on chronic hypoxic respiratory failure (HCC)   Paroxysmal atrial fibrillation (HCC)   Volume overload   Acute exacerbation of CHF (congestive heart failure) (HCC)   Cardiac arrest (HCC)   On mechanically assisted ventilation (HCC)   Aspiration pneumonia of both lower lobes due to gastric secretions (HCC)   Shock (HCC)   Aspiration pneumonia due to gastric secretions (HCC)   Septic shock (HCC)   Acute on chronic HFrEF (heart failure with reduced ejection fraction) (HCC)   Recommendations/Plan: Stop CRRT, pressors, tube feeds, and extubate today with goal to lean heavily into comfort focused care, allowing nature to take it's course. Family understand patient will likely decline and pass away within the next several hours. If he declines, transition to full comfort measures. If he stabilizes, PMT will plan to meet family again tomorrow 3/2 for ongoing GOC Let dilaudid drip run for at least 1 hour prior to extubation Wean pressors at time of extubation PMT will continue to follow and support holistically  Symptom Management Continuous dilaudid infusion; bolus doses for breakthrough pain/dyspnea/increased work of breathing/RR>25 Tylenol PRN pain/fever Robinul PRN secretions Versed PRN agitation/sedation Zofran PRN nausea/vomiting Liquifilm Tears PRN dry eye  **ADDENDUM** Received notification family called PMT phone requesting assistance with getting a chaplain to visit to provide prayer to family/patient. Paged and spoke with on call chaplain - they will visit with family/patient soon. Spiritual care consult also placed.  Goals of Care and Additional Recommendations: Limitations on Scope of Treatment: No Tracheostomy  Code Status:    Code Status Orders  (From admission, onward)           Start      Ordered   03/02/23 1428  Do not attempt resuscitation (DNR) Pre-Arrest Interventions Desired  (Code Status)  Continuous       Question Answer Comment  If pulseless and not breathing No CPR or chest compressions.   In Pre-Arrest Conditions (Patient Has Pulse and Is Breathing) May intubate, use advanced airway interventions and cardioversion/ACLS medications if appropriate or indicated. May transfer to ICU.   Consent: Discussion documented in EHR or advanced directives reviewed      03/02/23 1428           Code Status History     Date Active Date Inactive Code Status Order ID Comments User Context   02/26/2023 0636 03/02/2023 1428 Full Code [id number]  John Giovanni, MD ED   02/05/2023 0254 02/06/2023 1844 Full Code [id number]  Darlin Drop, DO ED   01/15/2023 0909 01/19/2023 2138 Full Code [id number]  Minor, Vilinda Blanks, NP ED   01/15/2023 0450 01/15/2023 0909 Full Code [id number]  Tereasa Coop, MD ED   12/27/2022 2358 01/01/2023 1946 Full Code [id number]  Howerter, Chaney Born, DO ED   11/29/2022 1031 11/30/2022 2212 Full Code [id number]  Lurline Del, MD ED   09/15/2022 1243 09/24/2022 2333 Full Code [id number]  Alfredo Martinez, MD ED   06/22/2022 1739 06/28/2022 2159 DNR [id number]  Ernie Avena, NP Inpatient   06/20/2022 0033 06/22/2022 1739 Full Code [id number]  Joellen Jersey., MD ED   09/13/2021 2350 09/17/2021 2224 Full Code [id number]  Howerter, Chaney Born, DO ED   09/04/2021 2104 09/08/2021 1824 Full Code [id number]  Eduard Clos, MD Inpatient   06/20/2021 0942 06/26/2021 2359 Full Code [id number]  Graceann Congress, PA-C Inpatient   07/21/2018 1138 07/21/2018 1714 Full Code [id number]  Maeola Harman, MD Inpatient   11/10/2013 1812 11/11/2013 2126 Full Code  [id number]  [facility] Inpatient   10/09/2013 1343 10/09/2013 2026 Full Code [id number]  Kathleene Hazel, MD Inpatient   08/10/2011 1702 08/19/2011 1428 Full Code [id number]  Alonna Buckler, RN Inpatient    03/13/2011 0647 03/14/2011 1849 Full Code [id number]  Eduard Clos, MD Inpatient      Advance Directive Documentation    Flowsheet Row Most Recent Value  Type of Advance Directive Living will, Healthcare Power of Attorney  Pre-existing out of facility DNR order (yellow form or pink MOST form) --  "MOST" Form in Place? --       Prognosis:  hours  Discharge Planning: Anticipated Hospital Death  Care plan was discussed with primary RN, patient's family, Janeann Forehand, NP  Thank you for allowing the Palliative Medicine Team to assist in the care of this patient.   Total Time 80 minutes Prolonged Time Billed  yes       Haskel Khan, NP  Please contact Palliative Medicine Team phone at [phone] for questions and concerns.   *Portions of this note are a verbal dictation therefore any spelling and/or grammatical errors are due to the "[employer]" system interpretation.

## 2023-04-09 NOTE — Progress Notes (Signed)
 Chaplain responded to both a consult and a page request to offer prayer before Pt's extubation. Chaplain accompanied Pt's family and offered a moment of prayer and compassionate support, before Pt's extubation.    03/13/2023 1645  Spiritual Encounters  Type of Visit Initial  Care provided to: Patient;Family  Conversation partners present during encounter Nurse  Referral source Clinical staff;Chaplain assessment;Code page  Reason for visit Religious ritual  OnCall Visit Yes  Spiritual Framework  Presenting Themes Courage hope and growth;Rituals and practive  Community/Connection Family  Patient Stress Factors Exhausted  Family Stress Factors Exhausted;Loss  Interventions  Spiritual Care Interventions Made Prayer;Compassionate presence;Supported grief process  Intervention Outcomes  Outcomes Connection to values and goals of care;Connected to spiritual community  Spiritual Care Plan  Spiritual Care Issues Still Outstanding Lunette Stands will continue to follow

## 2023-04-09 NOTE — Progress Notes (Signed)
RT NOTE: RT extubated patient to comfort care per order.

## 2023-04-09 NOTE — Progress Notes (Signed)
 Late Note Entry- March 11, 2023  Contacted [facility] this morning to be advised of pt's passing.   Olivia Canter Renal Navigator [phone]

## 2023-04-09 DEATH — deceased
